# Patient Record
Sex: Female | Born: 1935
Health system: Southern US, Community
[De-identification: ages and names within clinical notes are randomized; demographics above are authoritative.]

## PROBLEM LIST (undated history)

## (undated) DIAGNOSIS — H409 Unspecified glaucoma: Secondary | ICD-10-CM

## (undated) DIAGNOSIS — T4145XA Adverse effect of unspecified anesthetic, initial encounter: Secondary | ICD-10-CM

## (undated) DIAGNOSIS — D649 Anemia, unspecified: Secondary | ICD-10-CM

## (undated) DIAGNOSIS — J189 Pneumonia, unspecified organism: Secondary | ICD-10-CM

## (undated) DIAGNOSIS — R0600 Dyspnea, unspecified: Secondary | ICD-10-CM

## (undated) DIAGNOSIS — R1310 Dysphagia, unspecified: Secondary | ICD-10-CM

## (undated) DIAGNOSIS — E1122 Type 2 diabetes mellitus with diabetic chronic kidney disease: Secondary | ICD-10-CM

## (undated) DIAGNOSIS — F32A Depression, unspecified: Secondary | ICD-10-CM

## (undated) DIAGNOSIS — N186 End stage renal disease: Secondary | ICD-10-CM

## (undated) DIAGNOSIS — C50911 Malignant neoplasm of unspecified site of right female breast: Secondary | ICD-10-CM

## (undated) DIAGNOSIS — T8859XA Other complications of anesthesia, initial encounter: Secondary | ICD-10-CM

## (undated) DIAGNOSIS — I10 Essential (primary) hypertension: Secondary | ICD-10-CM

## (undated) DIAGNOSIS — N184 Chronic kidney disease, stage 4 (severe): Secondary | ICD-10-CM

## (undated) DIAGNOSIS — E785 Hyperlipidemia, unspecified: Secondary | ICD-10-CM

## (undated) DIAGNOSIS — K219 Gastro-esophageal reflux disease without esophagitis: Secondary | ICD-10-CM

## (undated) DIAGNOSIS — N189 Chronic kidney disease, unspecified: Secondary | ICD-10-CM

## (undated) DIAGNOSIS — F329 Major depressive disorder, single episode, unspecified: Secondary | ICD-10-CM

## (undated) DIAGNOSIS — F419 Anxiety disorder, unspecified: Secondary | ICD-10-CM

## (undated) DIAGNOSIS — C9000 Multiple myeloma not having achieved remission: Secondary | ICD-10-CM

## (undated) DIAGNOSIS — F0390 Unspecified dementia without behavioral disturbance: Secondary | ICD-10-CM

## (undated) DIAGNOSIS — E119 Type 2 diabetes mellitus without complications: Secondary | ICD-10-CM

## (undated) HISTORY — DX: Anxiety disorder, unspecified: F41.9

## (undated) HISTORY — DX: Chronic kidney disease, stage 4 (severe): N18.4

## (undated) HISTORY — DX: Type 2 diabetes mellitus with diabetic chronic kidney disease: N18.6

## (undated) HISTORY — PX: FRACTURE SURGERY: SHX138

## (undated) HISTORY — DX: Multiple myeloma not having achieved remission: C90.00

## (undated) HISTORY — DX: Essential (primary) hypertension: I10

## (undated) HISTORY — DX: Major depressive disorder, single episode, unspecified: F32.9

## (undated) HISTORY — DX: Malignant neoplasm of unspecified site of right female breast: C50.911

## (undated) HISTORY — DX: Depression, unspecified: F32.A

## (undated) HISTORY — PX: RETINAL DETACHMENT SURGERY: SHX105

## (undated) HISTORY — DX: Chronic kidney disease, unspecified: N18.9

## (undated) HISTORY — DX: Anemia, unspecified: D64.9

## (undated) HISTORY — DX: Type 2 diabetes mellitus without complications: E11.9

## (undated) HISTORY — PX: MASTECTOMY, PARTIAL: SHX709

## (undated) HISTORY — DX: Type 2 diabetes mellitus with diabetic chronic kidney disease: E11.22

## (undated) HISTORY — PX: COLONOSCOPY: SHX174

## (undated) HISTORY — PX: OTHER SURGICAL HISTORY: SHX169

---

## 2006-06-30 ENCOUNTER — Ambulatory Visit (HOSPITAL_COMMUNITY): Admission: RE | Admit: 2006-06-30 | Discharge: 2006-06-30 | Payer: Self-pay | Admitting: *Deleted

## 2007-08-24 ENCOUNTER — Ambulatory Visit (HOSPITAL_COMMUNITY): Admission: RE | Admit: 2007-08-24 | Discharge: 2007-08-24 | Payer: Self-pay | Admitting: Ophthalmology

## 2008-03-19 ENCOUNTER — Ambulatory Visit (HOSPITAL_COMMUNITY): Payer: Self-pay | Admitting: Psychology

## 2008-03-26 ENCOUNTER — Ambulatory Visit (HOSPITAL_COMMUNITY): Payer: Self-pay | Admitting: Psychology

## 2008-04-02 ENCOUNTER — Ambulatory Visit (HOSPITAL_COMMUNITY): Payer: Self-pay | Admitting: Psychology

## 2008-04-16 ENCOUNTER — Ambulatory Visit (HOSPITAL_COMMUNITY): Payer: Self-pay | Admitting: Psychology

## 2008-05-21 ENCOUNTER — Ambulatory Visit (HOSPITAL_COMMUNITY): Payer: Self-pay | Admitting: Psychology

## 2008-06-21 ENCOUNTER — Ambulatory Visit (HOSPITAL_COMMUNITY): Payer: Self-pay | Admitting: Psychiatry

## 2008-06-28 ENCOUNTER — Ambulatory Visit (HOSPITAL_COMMUNITY): Payer: Self-pay | Admitting: Psychology

## 2008-07-26 ENCOUNTER — Ambulatory Visit (HOSPITAL_COMMUNITY): Payer: Self-pay | Admitting: Psychology

## 2008-08-27 ENCOUNTER — Ambulatory Visit (HOSPITAL_COMMUNITY): Payer: Self-pay | Admitting: Psychology

## 2008-09-28 ENCOUNTER — Ambulatory Visit (HOSPITAL_COMMUNITY): Payer: Self-pay | Admitting: Psychology

## 2009-02-13 ENCOUNTER — Ambulatory Visit (HOSPITAL_COMMUNITY): Payer: Self-pay | Admitting: Psychology

## 2010-07-22 NOTE — Op Note (Signed)
Carly Wood, Carly Wood                ACCOUNT NO.:  [id number]   MEDICAL RECORD NO.:  [id number]          PATIENT TYPE:  AMB   LOCATION:  SDS                          FACILITY:  [facility]   PHYSICIAN:  Clent Demark. Rankin, M.D.   DATE OF BIRTH:  Jul 21, 1935   DATE OF PROCEDURE:  08/24/2007  DATE OF DISCHARGE:  08/24/2007                               OPERATIVE REPORT   PREOPERATIVE DIAGNOSES:  1. Combined traction rhegmatogenous detachment, right eye - neglected      macula-off.  2. Proliferative vitreoretinopathy, stage C2.   POSTOPERATIVE DIAGNOSES:  1. Combined traction rhegmatogenous detachment, right eye - neglected      macula-off.  2. Proliferative vitreoretinopathy, stage C2.   PROCEDURES:  Posterior vitrectomy with membrane peel to allow complex  retinal detach repair with scleral buckle vitrectomy and injection of  temporary gas, temporary air, final injection of vitreous substitute  permanent silicone oil [id number] [place] of the right eye, 20-gauge  vitrectomy was used.   SURGEON:  Clent Demark. Rankin, MD   ANESTHESIA:  General endotracheal anesthesia.   INDICATIONS FOR PROCEDURE:  The patient is a 75 year old woman with  profound vision loss in the right eye in level of 2400s and found to  have inferior retinal detachment, evidence of proliferative  vitreoretinopathy, chronic retinal detachment with outer retinal white  dots in the area of previous elevation.  She also appears to have PVR  with star folds inferonasally and inferotemporally.  This is an attempt  to reattach retina.  The patient understands the risks of anesthesia,  including rate occurrence of death, loss of the eye including, but not  limited to hemorrhage, infection, scarring, need for surgery, no change  in vision, loss of vision, and progression of disease despite  intervention.   PROCEDURE IN DETAIL:  After appropriate signed consent was obtained, the  patient was taken to the operating room.  In the  operating room,  appropriate monitors followed by mild sedation.  General endotracheal  anesthesia instilled without difficulty.  The right periocular region  was sterilely prepped and draped in usual sterile fashion.  Lid speculum  was applied.  Conjunctival peritomy was then fashioned to 360 degrees.  Quadrants were entered.  Rectus muscles isolated on 2-0 silk ties.  Two  rows of retinal cryopexy was placed, the inferior one 180 degrees on the  vitreous base.  At this time, 287 soft synthetic filament was selected  and placed with 240 band encircling.  The buckle started at 2:30  position extending inferior and around to the 9:30 position with the  encircling band joined on the superonasal quadrant with 70-Watzke  sleeve.  The buckle was placed in the position, thereafter the tension  was drawn, but it was not tied permanently at this time.  Attention was  drawn to vitrectomy.  A 20-gauge vitrectomy was started.  The infusion  was secured 3.5 mm posterior to limbus in the infratemporal quadrant.  Placement of vitreous cavity verified visually.  Superior sclerotomies  fashioned.  Microscope was positioned with the biome attachment.  Core  vitrectomy was then begun.  Excellent mobilization of the retina was  obtained.  Vitreous base shaved 360 degrees.  Internal diathermy was  then used to create a retinotomy posteriorly inferior to the optic  nerve.  Fluid-fluid exchange carried out and thick subretinal fluid was  removed.  Thereafter, the sclerotomies were then closed temporarily.  The buckle was then tightened and secured with 5-0 Mersilene mattress  sutures, 2 in the inferior quadrants each and then 1 in the superior  quadrants each.  Band was tightened to appropriate tension.  Thereafter,  the attention was drawn to vitrectomy.  Fluid air exchange completed.  The laser retinopexy was then applied on the slope the buckle inferiorly  bed of the detachment as well as posterior to the  buckle inferior 180  degrees.  All preretinal subretinal fluid was removed in this fashion.  A retinotomy was then closed with laser retinopexy.  Fluid and air-  silicone oil exchange carried out passively after closing off wound of  the sclerotomies superiorly.  Appropriate fill was obtained.  All  sclerotomies were closed and the infusion was similarly closed.  The  scleral surface of the eye was then copiously irrigated to remove her  small amounts of silicone oil.  Thereafter, the conjunctiva was then  irrigated and sub-Tenon's area was irrigated with bug juice.  Tenon's  and then conjunctiva brought forward and closed with interrupted 7-0  Vicryl to the limbus.  Subconjunctival Decadron applied.  Sterile patch  and Fox shield applied.  The patient tolerated the procedure without  complication.  She was taken to the recovery room in good stable  condition.      Clent Demark Rankin, M.D.  Electronically Signed     GAR/MEDQ  D:  08/24/2007  T:  08/25/2007  Job:  [id number]

## 2010-12-04 LAB — CBC
HCT: 33.9 — ABNORMAL LOW
Hemoglobin: 11.5 — ABNORMAL LOW
MCHC: 33.9
MCV: 90.3
RBC: 3.75 — ABNORMAL LOW

## 2010-12-04 LAB — BASIC METABOLIC PANEL
CO2: 27
Chloride: 107
Glucose, Bld: 96
Potassium: 3.5
Sodium: 142

## 2015-08-19 ENCOUNTER — Encounter (HOSPITAL_COMMUNITY): Payer: Self-pay | Admitting: Hematology & Oncology

## 2015-08-19 ENCOUNTER — Encounter (HOSPITAL_COMMUNITY): Payer: Medicare Other | Attending: Hematology & Oncology | Admitting: Hematology & Oncology

## 2015-08-19 ENCOUNTER — Encounter (HOSPITAL_COMMUNITY): Payer: Self-pay | Admitting: Lab

## 2015-08-19 VITALS — BP 133/65 | HR 68 | Temp 97.7°F | Resp 16 | Ht 63.0 in | Wt 185.6 lb

## 2015-08-19 DIAGNOSIS — C50411 Malignant neoplasm of upper-outer quadrant of right female breast: Secondary | ICD-10-CM

## 2015-08-19 DIAGNOSIS — I890 Lymphedema, not elsewhere classified: Secondary | ICD-10-CM

## 2015-08-19 DIAGNOSIS — Z1239 Encounter for other screening for malignant neoplasm of breast: Secondary | ICD-10-CM

## 2015-08-19 DIAGNOSIS — D0591 Unspecified type of carcinoma in situ of right breast: Secondary | ICD-10-CM

## 2015-08-19 DIAGNOSIS — D649 Anemia, unspecified: Secondary | ICD-10-CM

## 2015-08-19 DIAGNOSIS — E1122 Type 2 diabetes mellitus with diabetic chronic kidney disease: Secondary | ICD-10-CM | POA: Insufficient documentation

## 2015-08-19 DIAGNOSIS — N183 Chronic kidney disease, stage 3 unspecified: Secondary | ICD-10-CM

## 2015-08-19 DIAGNOSIS — N189 Chronic kidney disease, unspecified: Secondary | ICD-10-CM | POA: Insufficient documentation

## 2015-08-19 DIAGNOSIS — Z79811 Long term (current) use of aromatase inhibitors: Secondary | ICD-10-CM

## 2015-08-19 DIAGNOSIS — Z170 Estrogen receptor positive status [ER+]: Secondary | ICD-10-CM

## 2015-08-19 DIAGNOSIS — Z9011 Acquired absence of right breast and nipple: Secondary | ICD-10-CM

## 2015-08-19 DIAGNOSIS — Z7982 Long term (current) use of aspirin: Secondary | ICD-10-CM | POA: Insufficient documentation

## 2015-08-19 DIAGNOSIS — M8580 Other specified disorders of bone density and structure, unspecified site: Secondary | ICD-10-CM

## 2015-08-19 DIAGNOSIS — Z79899 Other long term (current) drug therapy: Secondary | ICD-10-CM | POA: Insufficient documentation

## 2015-08-19 DIAGNOSIS — C50911 Malignant neoplasm of unspecified site of right female breast: Secondary | ICD-10-CM

## 2015-08-19 MED ORDER — LIDOCAINE-PRILOCAINE 2.5-2.5 % EX CREA: 1.0000 "application " | TOPICAL_CREAM | CUTANEOUS | Status: DC | PRN

## 2015-08-19 NOTE — Progress Notes (Signed)
[facility]  CONSULT NOTE  CHIEF COMPLAINTS/PURPOSE OF CONSULTATION:    Stage 1 infiltrating ductal carcinoma of right female breast ([place])   10/24/2014 Cancer Staging T1cN0M0   10/24/2014 Surgery R mastectomy, T1c, N0   12/28/2014 -  Chemotherapy Taxol/herceptin weekly X 12, Herceptin every 21 days, last due in September 2017   03/11/2015 -  Anti-estrogen oral therapy Arimidex 1 mg daily   HISTORY OF PRESENTING ILLNESS:  Carly Wood 80 y.o. female is here to establish ongoing care for a stage I ER+ PR+ HER 2 + carcinoma of the R breast. She underwent a mastectomy last August. She is currently on herceptin/arimidex. Patient's malignancy was in the upper outer quadrant of the R breast, she had undergone a biopsy previously of this area but it was benign. Repeat mammogram on 09/12/2014 showed growth in the spiculated mass and repeat biopsy was recommended.   She is here today accompanied by her sisters Deneise Lever and [place], who she lives with. They remark that their sister [name] is not here.   The patient says that everybody calls her Carly Wood. She says she prays often for everyone, and is praying we can keep her alive.   She was advised that her cancer was Stage 1 and that the ongoing goal is for her to complete 52 weeks of Herceptin and remain on her aromatase inhibitor. The patient and her sisters also mention that her mammogram is due.Per records she will be due for mammograph in July 2017.   Ms. Biglow and her sisters note that she does not have any cream to numb her port. She is open to trying the numbing cream.  She notes that Dr. Ladona Horns in [place] did her surgery. She says she also recently fell and hurt her knee, but that the pain from her knee is now resolved.  Ms Corsi and her sister Wilford Sports have diabetes. Her sister Wilford Sports mentions that she has multiple myeloma and also had a CNS lymphoma, and notes that she is on Revlamid.  She is interested in discussing her use of benadryl  prior to her herceptin, she notes that it makes her too sleepy. As an aside, Ms. Eklund notes that she does not have any nausea or diarrhea from her chemo.  Toward the end of her appointment, she notes "why does my back stay cold all the time?"  During the physical examination, she notes that her appetite is good. She also denies any belly pain, and denies any lumps or bumps anywhere. Lymphedema is noted in her R arm, and she says she is not currently receiving any form of therapy for this.  Dr. Brigitte Pulse is her PCP.   MEDICAL HISTORY:  Past Medical History  Diagnosis Date  . Brain cancer ([place])     right   . Diabetes mellitus without complication ([place])   . Anemia   . Chronic kidney disease     SURGICAL HISTORY: History reviewed. No pertinent past surgical history.  SOCIAL HISTORY: Social History   Social History  . Marital Status: Single    Spouse Name: N/A  . Number of Children: N/A  . Years of Education: N/A   Occupational History  . Not on file.   Social History Main Topics  . Smoking status: Never Smoker   . Smokeless tobacco: Not on file  . Alcohol Use: Not on file  . Drug Use: Not on file  . Sexual Activity: Not on file   Other Topics Concern  .  Not on file   Social History Narrative   Carly Wood, the patient, never married; says she never wanted to. She remarks that this was a choice she made; "not that I didn't have the opportunities, I just made the choice not to do it." NO tobacco, cigarettes or chewing tobacco. No ETOH  The 3 sisters live together. Deneise Lever does all the cooking.  She has nieces and nephews; one teaches school in [place]. Pilar Plate is her oldest brother; he was a principal. Has retired. Pilar Plate has a wife who was a Pharmacist, hospital; they have 3 children, including a son who graduated from [employer], and a daughter who graduated from [employer] who is a Sport and exercise psychologist, and another daughter who has a PhD in education. Harle Battiest is her "favorite niece." Martin Majestic to college and is  teaching something to do with drafting.  The patient, Carly Wood, was a good Ship broker; graduated high school and college. 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 Taught in [place], [place].  She notes she had hobbies; used to do artwork. Drawing pictures, mostly flowers, houses. Pen and pencil, colored pencils.  FAMILY HISTORY: History reviewed. No pertinent family history.  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  Father was 63 when he died; heart disease. Mother was 30 when she died. Diabetic, with congestive heart failure. Had a stroke as well. Sister Deneise Lever was caregiver for their mother for 10 years after her stroke. Her sister Wilford Sports has multiple myeloma and also had a CNS lymphoma, and notes that she is on Revlamid. Wilford Sports is 16 years out from her brain surgery.  7 brothers and sisters total; one is deceased. 6 siblings left. 1 that passed died of prostate cancer. "He lasted 12 years."  ALLERGIES:  is allergic to penicillins; ace inhibitors; and lisinopril.  MEDICATIONS:  Current Outpatient Prescriptions  Medication Sig Dispense Refill  . amLODipine (NORVASC) 5 MG tablet     . anastrozole (ARIMIDEX) 1 MG tablet Take by mouth.    Marland Kitchen acetaminophen (TYLENOL) 500 MG tablet Take by mouth.    Marland Kitchen anti-nausea (EMETROL) solution Take by mouth.    Marland Kitchen aspirin 81 MG chewable tablet Chew by mouth.    . calcium-vitamin D (OSCAL WITH D) 500-200 MG-UNIT tablet Take by mouth.    . cloNIDine (CATAPRES) 0.3 MG tablet Take by mouth.    . docusate sodium (COLACE) 100 MG capsule Take by mouth.    . esomeprazole (NEXIUM) 40 MG capsule Take by mouth.    Marland Kitchen glimepiride (AMARYL) 4 MG tablet Take by mouth.     . lidocaine-prilocaine (EMLA) cream Apply 1 application topically as needed. Apply to port site prior when needed for chemo, labs or port flushes. 30 g 2  . LORazepam (ATIVAN) 0.5 MG tablet Take by mouth.    . nebivolol (BYSTOLIC) 10 MG tablet Take by mouth.    Marland Kitchen PARoxetine (PAXIL) 20 MG tablet Take by mouth.    . pioglitazone (ACTOS) 15 MG tablet Take by mouth.    . pioglitazone (ACTOS) 30 MG tablet Take 15 mg by mouth 1 day or 1 dose.    . pravastatin (PRAVACHOL) 20 MG tablet Take by mouth.    Marland Kitchen  pravastatin (PRAVACHOL) 20 MG tablet     . traZODone (DESYREL) 50 MG tablet     . VITAMIN B COMPLEX-C PO Take by mouth.     No current facility-administered medications for this visit.    Review of Systems  Constitutional: Negative.  Negative for fever, chills, weight loss and malaise/fatigue.  HENT: Negative.  Negative for congestion, hearing loss, nosebleeds, sore throat and tinnitus.   Eyes: Negative.  Negative for blurred vision, double vision, pain and discharge.  Respiratory: Negative.  Negative for cough, hemoptysis, sputum production, shortness of breath and wheezing.   Cardiovascular: Negative.  Negative for chest pain, palpitations, claudication, leg swelling and PND.  Gastrointestinal: Negative.  Negative for heartburn, nausea, vomiting, abdominal pain, diarrhea, constipation, blood in stool and melena.  Genitourinary: Negative.  Negative for dysuria, urgency, frequency and hematuria.  Musculoskeletal: Negative.  Negative for myalgias, joint pain and falls.  Skin: Negative.  Negative for itching and rash.  Neurological: Negative.  Negative for dizziness, tingling, tremors, sensory change, speech change, focal weakness, seizures, loss of consciousness, weakness and headaches.  Endo/Heme/Allergies: Negative.  Does not bruise/bleed easily.  Psychiatric/Behavioral: Negative.  Negative for depression, suicidal ideas, memory loss and substance abuse. The patient is not nervous/anxious and  does not have insomnia.   All other systems reviewed and are negative.  14 point ROS was done and is otherwise as detailed above or in HPI   PHYSICAL EXAMINATION: ECOG PERFORMANCE STATUS: 1 - Symptomatic but completely ambulatory  Filed Vitals:   08/19/15 1450  BP: 133/65  Pulse: 68  Temp: 97.7 F (36.5 C)  Resp: 16   Filed Weights   08/19/15 1450  Weight: 185 lb 9.6 oz (84.188 kg)     Physical Exam  Constitutional: She is oriented to person, place, and time and well-developed, well-nourished, and in no distress.  HENT:  Head: Normocephalic and atraumatic.  Right Ear: External ear normal.  Nose: Nose normal.  Mouth/Throat: Oropharynx is clear and moist. No oropharyngeal exudate.  Eyes: Conjunctivae and EOM are normal. Pupils are equal, round, and reactive to light. Right eye exhibits no discharge. Left eye exhibits no discharge. No scleral icterus.  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  Neck: Normal range of motion. Neck supple. No tracheal deviation present. No thyromegaly present.  Cardiovascular: Normal rate, regular rhythm and normal heart sounds.  Exam reveals no gallop and no friction rub.   No murmur heard. Pulmonary/Chest: Effort normal and breath sounds normal. She has no wheezes. She has no rales.    Abdominal: Soft. Bowel sounds are normal. She exhibits no distension and no mass. There is no tenderness. There is no rebound and no guarding.  Musculoskeletal: Normal range of motion. She exhibits no edema.  Lymphadenopathy:    She has no cervical adenopathy.  Lymphedema in her right arm; no current treatment for this.  Neurological: She is alert and oriented to person, place, and time. She has normal reflexes. No cranial nerve deficit. Gait normal. Coordination normal.  Skin: Skin is warm and dry. No rash noted.  Psychiatric: Mood, memory, affect and judgment normal.  Nursing note and vitals reviewed.     LABORATORY DATA:  I have reviewed the data as  listed Lab Results  Component Value Date   WBC 7.1 08/24/2007   HGB 11.5* 08/24/2007   HCT 33.9* 08/24/2007   MCV 90.3 08/24/2007   PLT 296 08/24/2007   CMP     Component Value Date/Time   NA  142 08/24/2007 0952   K 3.5 08/24/2007 0952   CL 107 08/24/2007 0952   CO2 27 08/24/2007 0952   GLUCOSE 96 08/24/2007 0952   BUN 21 08/24/2007 0952   CREATININE 1.14 08/24/2007 0952   CALCIUM 9.7 08/24/2007 0952   GFRNONAA 47* 08/24/2007 0952   GFRAA * 08/24/2007 0952    57        The eGFR has been calculated using the MDRD equation. This calculation has not been validated in all clinical   RADIOGRAPHIC STUDIES: I have personally reviewed the radiological images as listed and agreed with the findings in the report. No results found.  ASSESSMENT & PLAN:  Stage I ER+ PR+ HER 2 + carcinoma of R breast, upper outer quadrant R mastectomy Arimidex Herceptin Anemia CKD, Stage III DEXA with osteopenia 05/2015 Last 2-D echo 05/2015 Lymphedema  L breast screening mammogram due 09/2015  She was advised that her cancer was Stage 1 and that the goal is for her to complete 52 weeks of Herceptin. She is due again for Herceptin on the 23rd (08/30/2015).  I need additional pathology in regards to her HER 2 testing and we will request this from [facility]. We need a copy of her last ECHO as well. She will be due again according to records.   We discussed her lymphedema. She has not been to PT. She does not have a sleeve or glove. I explained the long term effects from untreated lymphedema. Currently not willing to go to PT. We will address again at follow-up.   Her sisters are concerned about her physical inactivity and we discussed the benefits of activity for overall health. We discussed the benefits of physical activity in regards to reducing breast cancer recurrence.   She is to continue on calcium and vitamin D daily. She has not discussed prolia or a bisphosphonate therapy in regards to her AI  use and bone density and we will address this moving forward as well.   Mammogram for July has been ordered.   I will see her again for follow-up after next herceptin therapy.   Anemia will be followed. We can consider additional anemia workup in the near future as well.   Orders Placed This Encounter  Procedures  . MM SCREENING BREAST TOMO UNI L    Standing Status: Future     Number of Occurrences:      Standing Expiration Date: 10/21/2016    Order Specific Question:  Reason for Exam (SYMPTOM  OR DIAGNOSIS REQUIRED)    Answer:  screening, history R breast cancer s/p mastectomy    Order Specific Question:  Preferred imaging location?    Answer:  [facility]    All questions were answered. The patient knows to call the clinic with any problems, questions or concerns.  This document serves as a record of services personally performed by Ancil Linsey, MD. It was created on her behalf by Toni Amend, a trained medical scribe. The creation of this record is based on the scribe's personal observations and the provider's statements to them. This document has been checked and approved by the attending provider.  I have reviewed the above documentation for accuracy and completeness and I agree with the above.  This note was electronically signed.  Molli Hazard, MD  08/22/2015 4:33 PM

## 2015-08-19 NOTE — Patient Instructions (Addendum)
[facility] at [facility] Discharge Instructions  RECOMMENDATIONS MADE BY THE CONSULTANT AND ANY TEST RESULTS WILL BE SENT TO YOUR REFERRING PHYSICIAN.  Exam done and seen today by Dr. Whitney Muse Will do labs prior to treatment. Will start Herceptin on June 23rd Will need Referral for lymphadema therapy in [place]. Return to see the doctor after second treatment. Please call the clinic if you have any questions or concerns  Thank you for choosing [facility] at [facility] to provide your oncology and hematology care.  To afford each patient quality time with our provider, please arrive at least 15 minutes before your scheduled appointment time.   Beginning January 23rd 2017 lab work for the [facility] will be done in the  Main lab at [facility] on 1st floor. If you have a lab appointment with the [facility] please come in thru the  Main Entrance and check in at the main information desk  You need to re-schedule your appointment should you arrive 10 or more minutes late.  We strive to give you quality time with our providers, and arriving late affects you and other patients whose appointments are after yours.  Also, if you no show three or more times for appointments you may be dismissed from the clinic at the providers discretion.     Again, thank you for choosing [facility].  Our hope is that these requests will decrease the amount of time that you wait before being seen by our physicians.       _____________________________________________________________  Should you have questions after your visit to [facility], please contact our office at [phone] between the hours of 8:30 a.m. and 4:30 p.m.  Voicemails left after 4:30 p.m. will not be returned until the following business day.  For prescription refill requests, have your pharmacy contact our office.         Resources For Cancer Patients and their  Caregivers ? American Cancer Society: Can assist with transportation, wigs, general needs, runs Look Good Feel Better.        936-448-0701 ? Cancer Care: Provides financial assistance, online support groups, medication/co-pay assistance.  1-800-813-HOPE (819)088-9244) ? [facility] Assists [place] cancer patients and their families through emotional , educational and financial support.  334-261-9330 ? [place] DSS Where to apply for food stamps, Medicaid and utility assistance. (407)269-9509 ? [facility]: Transportation to medical appointments. 862-443-2693 ? Social Security Administration: May apply for disability if have a Stage IV cancer. (812)376-4814 (310)761-6688 ? [employer], Disability and Transit Services: Assists with nutrition, care and transit needs. [facility] Support Programs: @10RELATIVEDAYS @ > Cancer Support Group  2nd Tuesday of the month 1pm-2pm, Journey Room  > Creative Journey  3rd Tuesday of the month 1130am-1pm, Journey Room  > Look Good Feel Better  1st Wednesday of the month 10am-12 noon, Journey Room (Call [facility] to register [phone])

## 2015-08-19 NOTE — Progress Notes (Signed)
Referral made to [facility] therapy.  Records faxed on 6/12 and they will contact patient.

## 2015-08-21 ENCOUNTER — Encounter (HOSPITAL_COMMUNITY): Payer: Self-pay | Admitting: Hematology & Oncology

## 2015-08-21 DIAGNOSIS — C50911 Malignant neoplasm of unspecified site of right female breast: Secondary | ICD-10-CM | POA: Insufficient documentation

## 2015-08-21 HISTORY — DX: Malignant neoplasm of unspecified site of right female breast: C50.911

## 2015-08-22 ENCOUNTER — Encounter (HOSPITAL_COMMUNITY): Payer: Self-pay | Admitting: Hematology & Oncology

## 2015-08-22 DIAGNOSIS — I890 Lymphedema, not elsewhere classified: Secondary | ICD-10-CM | POA: Insufficient documentation

## 2015-08-22 DIAGNOSIS — N184 Chronic kidney disease, stage 4 (severe): Secondary | ICD-10-CM

## 2015-08-22 DIAGNOSIS — Z9011 Acquired absence of right breast and nipple: Secondary | ICD-10-CM | POA: Insufficient documentation

## 2015-08-22 DIAGNOSIS — M8580 Other specified disorders of bone density and structure, unspecified site: Secondary | ICD-10-CM | POA: Insufficient documentation

## 2015-08-22 DIAGNOSIS — Z79811 Long term (current) use of aromatase inhibitors: Secondary | ICD-10-CM | POA: Insufficient documentation

## 2015-08-22 HISTORY — DX: Chronic kidney disease, stage 4 (severe): N18.4

## 2015-08-30 ENCOUNTER — Encounter (HOSPITAL_COMMUNITY): Payer: Self-pay | Admitting: Oncology

## 2015-08-30 ENCOUNTER — Encounter (HOSPITAL_BASED_OUTPATIENT_CLINIC_OR_DEPARTMENT_OTHER): Payer: Medicare Other | Admitting: Oncology

## 2015-08-30 ENCOUNTER — Encounter (HOSPITAL_BASED_OUTPATIENT_CLINIC_OR_DEPARTMENT_OTHER): Payer: Medicare Other

## 2015-08-30 VITALS — BP 158/75 | HR 72 | Temp 98.4°F | Resp 18 | Wt 191.6 lb

## 2015-08-30 DIAGNOSIS — Z79899 Other long term (current) drug therapy: Secondary | ICD-10-CM | POA: Diagnosis not present

## 2015-08-30 DIAGNOSIS — C50911 Malignant neoplasm of unspecified site of right female breast: Secondary | ICD-10-CM

## 2015-08-30 DIAGNOSIS — C50411 Malignant neoplasm of upper-outer quadrant of right female breast: Secondary | ICD-10-CM | POA: Diagnosis not present

## 2015-08-30 DIAGNOSIS — Z79811 Long term (current) use of aromatase inhibitors: Secondary | ICD-10-CM

## 2015-08-30 DIAGNOSIS — Z7982 Long term (current) use of aspirin: Secondary | ICD-10-CM | POA: Diagnosis not present

## 2015-08-30 DIAGNOSIS — Z5112 Encounter for antineoplastic immunotherapy: Secondary | ICD-10-CM

## 2015-08-30 DIAGNOSIS — Z170 Estrogen receptor positive status [ER+]: Secondary | ICD-10-CM

## 2015-08-30 DIAGNOSIS — N189 Chronic kidney disease, unspecified: Secondary | ICD-10-CM | POA: Diagnosis not present

## 2015-08-30 DIAGNOSIS — M8580 Other specified disorders of bone density and structure, unspecified site: Secondary | ICD-10-CM | POA: Diagnosis not present

## 2015-08-30 DIAGNOSIS — E1122 Type 2 diabetes mellitus with diabetic chronic kidney disease: Secondary | ICD-10-CM | POA: Diagnosis not present

## 2015-08-30 LAB — COMPREHENSIVE METABOLIC PANEL
ALT: 13 U/L — ABNORMAL LOW (ref 14–54)
ANION GAP: 7 (ref 5–15)
AST: 18 U/L (ref 15–41)
Albumin: 3.1 g/dL — ABNORMAL LOW (ref 3.5–5.0)
Alkaline Phosphatase: 87 U/L (ref 38–126)
BILIRUBIN TOTAL: 0.4 mg/dL (ref 0.3–1.2)
BUN: 36 mg/dL — AB (ref 6–20)
CHLORIDE: 107 mmol/L (ref 101–111)
CO2: 24 mmol/L (ref 22–32)
Calcium: 8.3 mg/dL — ABNORMAL LOW (ref 8.9–10.3)
Creatinine, Ser: 2.26 mg/dL — ABNORMAL HIGH (ref 0.44–1.00)
GFR, EST AFRICAN AMERICAN: 23 mL/min — AB (ref >60)
GFR, EST NON AFRICAN AMERICAN: 20 mL/min — AB (ref >60)
Glucose, Bld: 209 mg/dL — ABNORMAL HIGH (ref 65–99)
POTASSIUM: 4 mmol/L (ref 3.5–5.1)
Sodium: 138 mmol/L (ref 135–145)
TOTAL PROTEIN: 6.8 g/dL (ref 6.5–8.1)

## 2015-08-30 LAB — CBC WITH DIFFERENTIAL/PLATELET
BASOS ABS: 0 10*3/uL (ref 0.0–0.1)
Basophils Relative: 1 %
EOS ABS: 0.2 10*3/uL (ref 0.0–0.7)
EOS PCT: 4 %
HEMATOCRIT: 28.2 % — AB (ref 36.0–46.0)
Hemoglobin: 9 g/dL — ABNORMAL LOW (ref 12.0–15.0)
LYMPHS PCT: 20 %
Lymphs Abs: 1.1 10*3/uL (ref 0.7–4.0)
MCH: 30.1 pg (ref 26.0–34.0)
MCHC: 31.9 g/dL (ref 30.0–36.0)
MCV: 94.3 fL (ref 78.0–100.0)
Monocytes Absolute: 0.3 10*3/uL (ref 0.1–1.0)
Monocytes Relative: 5 %
Neutro Abs: 3.8 10*3/uL (ref 1.7–7.7)
Neutrophils Relative %: 70 %
PLATELETS: 237 10*3/uL (ref 150–400)
RBC: 2.99 MIL/uL — ABNORMAL LOW (ref 3.87–5.11)
RDW: 16.3 % — AB (ref 11.5–15.5)
WBC: 5.4 10*3/uL (ref 4.0–10.5)

## 2015-08-30 MED ORDER — SODIUM CHLORIDE 0.9 % IV SOLN
Freq: Once | INTRAVENOUS | Status: AC
  Administered 2015-08-30: 12:00:00 via INTRAVENOUS

## 2015-08-30 MED ORDER — DIPHENHYDRAMINE HCL 25 MG PO CAPS
50.0000 mg | ORAL_CAPSULE | Freq: Once | ORAL | Status: AC
  Administered 2015-08-30: 50 mg via ORAL
  Filled 2015-08-30: qty 2

## 2015-08-30 MED ORDER — HEPARIN SOD (PORK) LOCK FLUSH 100 UNIT/ML IV SOLN
500.0000 [IU] | Freq: Once | INTRAVENOUS | Status: AC | PRN
  Administered 2015-08-30: 500 [IU]

## 2015-08-30 MED ORDER — TRASTUZUMAB CHEMO INJECTION 440 MG
6.0000 mg/kg | Freq: Once | INTRAVENOUS | Status: AC
  Administered 2015-08-30: 504 mg via INTRAVENOUS
  Filled 2015-08-30: qty 24

## 2015-08-30 MED ORDER — HEPARIN SOD (PORK) LOCK FLUSH 100 UNIT/ML IV SOLN
INTRAVENOUS | Status: AC
  Filled 2015-08-30: qty 5

## 2015-08-30 MED ORDER — SODIUM CHLORIDE 0.9% FLUSH: 10.0000 mL | INTRAVENOUS | Status: DC | PRN

## 2015-08-30 MED ORDER — ACETAMINOPHEN 325 MG PO TABS
650.0000 mg | ORAL_TABLET | Freq: Once | ORAL | Status: AC
  Administered 2015-08-30: 650 mg via ORAL
  Filled 2015-08-30: qty 2

## 2015-08-30 NOTE — Progress Notes (Signed)
No primary care provider on file. No primary provider on file.  Stage 1 infiltrating ductal carcinoma of right female breast ([place]) - Plan: [facility] Cardiac [facility] Rest  CURRENT THERAPY: Arimidex and Herceptin  INTERVAL HISTORY: Carly Wood 80 y.o. female returns for followup of Stage I (T1CN0M0) invasive ductal carcinoma of the right breast, ER/PR+, HER2+.    Stage 1 infiltrating ductal carcinoma of right female breast ([place])   09/12/2014 Mammogram Mass in upper outer R breast, middle third depth appears slightly larger than it was on prior exam with more irreg spiculated margins   10/02/2014 Pathology Results biopsy with invasive ductal carcinoma high grade 1.1 cm, dcis solid type. additional R breast tissue, excision, invasive ductal high grade 0.9 cm    10/24/2014 Pathology Results no residual invasive carcinoma, DCIS, focal, atypical ductal hyperplasia, 0/7 LN positive for metastatic carcinoma ER > 90%, PR 30%, HER 2 2+   10/24/2014 Cancer Staging T1cN0M0   10/24/2014 Surgery R mastectomy, T1c, N0   12/28/2014 -  Chemotherapy Taxol/herceptin weekly X 12, Herceptin every 21 days, last due in September 2017   03/11/2015 -  Anti-estrogen oral therapy Arimidex 1 mg daily   The patient is accompanied by her sister today.  The patient is only minimally participatory in today's conversation today secondary to drowsiness from Benadryl Pre-medication.  The patient denies any complaints and confirms compliance with AI therapy.    Her sister confirms tha she is doing well.  Review of Systems  Constitutional: Negative.   HENT: Negative.   Eyes: Negative.   Respiratory: Negative.   Cardiovascular: Negative.   Gastrointestinal: Negative.   Genitourinary: Negative.   Musculoskeletal: Negative.   Skin: Negative.   Neurological: Negative.   Endo/Heme/Allergies: Negative.   Psychiatric/Behavioral: Negative.     Past Medical History  Diagnosis Date  . Brain cancer ([place])     right   .  Diabetes mellitus without complication ([place])   . Anemia   . Chronic kidney disease   . Stage 1 infiltrating ductal carcinoma of right female breast ([place]) 08/21/2015    ER+ PR+ HER 2 neu + (3+) T1cN0     History reviewed. No pertinent past surgical history.  History reviewed. No pertinent family history.  Social History   Social History  . Marital Status: Single    Spouse Name: N/A  . Number of Children: N/A  . Years of Education: N/A   Social History Main Topics  . Smoking status: Never Smoker   . Smokeless tobacco: None  . Alcohol Use: None  . Drug Use: None  . Sexual Activity: Not Asked   Other Topics Concern  . None   Social History Narrative     PHYSICAL EXAMINATION  ECOG PERFORMANCE STATUS: 2 - Symptomatic, <50% confined to bed  There were no vitals filed for this visit.  BP: 158/75 P 72 T 98.6F R 18 Weight 191.6 lbs  GENERAL:alert, no distress, comfortable, cooperative, obese and fatigued with flat affect, in chemorecliner, and accompanied by her sister. SKIN: skin color, texture, turgor are normal, no rashes or significant lesions HEAD: Normocephalic, No masses, lesions, tenderness or abnormalities EYES: normal, Conjunctiva are pink and non-injected EARS: External ears normal OROPHARYNX:mucous membranes are moist  NECK: supple, trachea midline LYMPH:  not examined BREAST:not examined LUNGS: clear to auscultation  HEART: regular rate & rhythm ABDOMEN:abdomen soft and obese BACK: Back symmetric, no curvature. EXTREMITIES:less then 2 second capillary refill, no joint deformities, effusion, or  inflammation, no skin discoloration  NEURO: alert & oriented x 3 with fluent speech, no focal motor/sensory deficits   LABORATORY DATA: CBC    Component Value Date/Time   WBC 5.4 08/30/2015 1148   RBC 2.99* 08/30/2015 1148   HGB 9.0* 08/30/2015 1148   HCT 28.2* 08/30/2015 1148   PLT 237 08/30/2015 1148   MCV 94.3 08/30/2015 1148   MCH 30.1 08/30/2015 1148     MCHC 31.9 08/30/2015 1148   RDW 16.3* 08/30/2015 1148   LYMPHSABS 1.1 08/30/2015 1148   MONOABS 0.3 08/30/2015 1148   EOSABS 0.2 08/30/2015 1148   BASOSABS 0.0 08/30/2015 1148      Chemistry      Component Value Date/Time   NA 138 08/30/2015 1148   K 4.0 08/30/2015 1148   CL 107 08/30/2015 1148   CO2 24 08/30/2015 1148   BUN 36* 08/30/2015 1148   CREATININE 2.26* 08/30/2015 1148      Component Value Date/Time   CALCIUM 8.3* 08/30/2015 1148   ALKPHOS 87 08/30/2015 1148   AST 18 08/30/2015 1148   ALT 13* 08/30/2015 1148   BILITOT 0.4 08/30/2015 1148        PENDING LABS:   RADIOGRAPHIC STUDIES:  No results found.   PATHOLOGY:    ASSESSMENT AND PLAN:  Stage 1 infiltrating ductal carcinoma of right female breast (HCC) Stage I (T1CN0M0) invasive ductal carcinoma of the right breast, ER/PR+, HER2+. S/P right mastectomy on 10/24/2014, followed by adjuvant chemotherapy consisting of weekly Taxol/Herceptin beginning on 12/28/2015.  Now on Herceptin x 52 weeks and AI therapy.  Oncology history is up to date.  Staging in [facility] problem list is completed.  I personally reviewed and went over laboratory results with the patient.  The results are noted within this dictation.  She is due for MUGA scan prior to her next cycle of chemotherapy.  As a result, order is placed and will be scheduled accordingly.  The patient has osteopenia while being on an AI.  She is a candidate for Prolia intervention.  Today, the patient is unable to participate in a meaningful conversation regarding this intervention.  We will need to address in the future.  Return in 3 weeks for follow-up.  If patient is doing well, we can consider seeing her every every 6 weeks with treatment every 3 weeks.      ORDERS PLACED FOR THIS ENCOUNTER: Orders Placed This Encounter  Procedures  . NM Cardiac Muga Rest    MEDICATIONS PRESCRIBED THIS ENCOUNTER: No orders of the defined types were placed in this  encounter.    THERAPY PLAN:  Continue herceptin x 52 week total and AI therapy x 5 years.  All questions were answered. The patient knows to call the clinic with any problems, questions or concerns. We can certainly see the patient much sooner if necessary.  Patient and plan discussed with Dr. Ancil Linsey and she is in agreement with the aforementioned.   This note is electronically signed by: Doy Mince 08/30/2015 9:47 PM

## 2015-08-30 NOTE — Progress Notes (Signed)
Carly Wood Tolerated chemotherapy well

## 2015-08-30 NOTE — Assessment & Plan Note (Addendum)
Stage I (T1CN0M0) invasive ductal carcinoma of the right breast, ER/PR+, HER2+. S/P right mastectomy on 10/24/2014, followed by adjuvant chemotherapy consisting of weekly Taxol/Herceptin beginning on 12/28/2015.  Now on Herceptin x 52 weeks and AI therapy.  Oncology history is up to date.  Staging in [facility] problem list is completed.  I personally reviewed and went over laboratory results with the patient.  The results are noted within this dictation.  She is due for MUGA scan prior to her next cycle of chemotherapy.  As a result, order is placed and will be scheduled accordingly.  The patient has osteopenia while being on an AI.  She is a candidate for Prolia intervention.  Today, the patient is unable to participate in a meaningful conversation regarding this intervention.  We will need to address in the future.  Return in 3 weeks for follow-up.  If patient is doing well, we can consider seeing her every every 6 weeks with treatment every 3 weeks.

## 2015-08-30 NOTE — Patient Instructions (Signed)
[place] at [facility] Discharge Instructions  RECOMMENDATIONS MADE BY THE CONSULTANT AND ANY TEST RESULTS WILL BE SENT TO YOUR REFERRING PHYSICIAN.  herceptin today Echo scheduled herceptin in 3 weeks Follow up as scheduled Please call the clinic if you have any questions or concerns   Thank you for choosing [place] at [facility] to provide your oncology and hematology care.  To afford each patient quality time with our provider, please arrive at least 15 minutes before your scheduled appointment time.   Beginning January 23rd 2017 lab work for the [facility] will be done in the  Main lab at [facility] on 1st floor. If you have a lab appointment with the [facility] please come in thru the  Main Entrance and check in at the main information desk  You need to re-schedule your appointment should you arrive 10 or more minutes late.  We strive to give you quality time with our providers, and arriving late affects you and other patients whose appointments are after yours.  Also, if you no show three or more times for appointments you may be dismissed from the clinic at the providers discretion.     Again, thank you for choosing [facility].  Our hope is that these requests will decrease the amount of time that you wait before being seen by our physicians.       _____________________________________________________________  Should you have questions after your visit to [facility], please contact our office at [phone] between the hours of 8:30 a.m. and 4:30 p.m.  Voicemails left after 4:30 p.m. will not be returned until the following business day.  For prescription refill requests, have your pharmacy contact our office.         Resources For Cancer Patients and their Caregivers ? American Cancer Society: Can assist with transportation, wigs, general needs, runs Look Good Feel Better.         571-079-2388 ? Cancer Care: Provides financial assistance, online support groups, medication/co-pay assistance.  1-800-813-HOPE (954)285-9699) ? [place] Assists [place] cancer patients and their families through emotional , educational and financial support.  469 601 5655 ? [place] DSS Where to apply for food stamps, Medicaid and utility assistance. (330)691-7347 ? RCATS: Transportation to medical appointments. 8673552945 ? Social Security Administration: May apply for disability if have a Stage IV cancer. (269)717-2225 (919) 531-2654 ? [employer], Disability and Transit Services: Assists with nutrition, care and transit needs. [place] Support Programs: @10RELATIVEDAYS @ > Cancer Support Group  2nd Tuesday of the month 1pm-2pm, Journey Room  > Creative Journey  3rd Tuesday of the month 1130am-1pm, Journey Room  > Look Good Feel Better  1st Wednesday of the month 10am-12 noon, Journey Room (Call [place] to register [phone])

## 2015-09-02 NOTE — Progress Notes (Signed)
24 hours call back- no problems from chemotherapy

## 2015-09-03 ENCOUNTER — Other Ambulatory Visit (HOSPITAL_COMMUNITY): Payer: Self-pay | Admitting: Oncology

## 2015-09-12 ENCOUNTER — Encounter (HOSPITAL_COMMUNITY): Payer: Self-pay

## 2015-09-12 ENCOUNTER — Encounter (HOSPITAL_COMMUNITY)
Admission: RE | Admit: 2015-09-12 | Discharge: 2015-09-12 | Disposition: A | Discharge status: Home / Self Care | Payer: Medicare Other | Source: Ambulatory Visit | Attending: Oncology | Admitting: Oncology

## 2015-09-12 DIAGNOSIS — C50911 Malignant neoplasm of unspecified site of right female breast: Secondary | ICD-10-CM | POA: Insufficient documentation

## 2015-09-12 DIAGNOSIS — Z79899 Other long term (current) drug therapy: Secondary | ICD-10-CM | POA: Insufficient documentation

## 2015-09-12 MED ORDER — TECHNETIUM TC 99M-LABELED RED BLOOD CELLS IV KIT
25.0000 | PACK | Freq: Once | INTRAVENOUS | Status: AC | PRN
  Administered 2015-09-12: 26.2 via INTRAVENOUS

## 2015-09-12 MED ORDER — HEPARIN SOD (PORK) LOCK FLUSH 100 UNIT/ML IV SOLN
INTRAVENOUS | Status: AC
  Filled 2015-09-12: qty 5

## 2015-09-16 ENCOUNTER — Encounter: Payer: Self-pay | Admitting: Oncology

## 2015-09-16 ENCOUNTER — Encounter: Payer: Self-pay | Admitting: Hematology & Oncology

## 2015-09-20 ENCOUNTER — Encounter (HOSPITAL_COMMUNITY): Payer: Medicare Other | Attending: Hematology & Oncology | Admitting: Hematology & Oncology

## 2015-09-20 ENCOUNTER — Encounter (HOSPITAL_COMMUNITY): Payer: Self-pay | Admitting: Hematology & Oncology

## 2015-09-20 ENCOUNTER — Encounter (HOSPITAL_BASED_OUTPATIENT_CLINIC_OR_DEPARTMENT_OTHER): Payer: Medicare Other

## 2015-09-20 VITALS — BP 163/64 | HR 58 | Temp 97.8°F | Resp 16

## 2015-09-20 VITALS — BP 153/63 | HR 64 | Temp 98.5°F | Resp 16 | Wt 189.3 lb

## 2015-09-20 DIAGNOSIS — Z7982 Long term (current) use of aspirin: Secondary | ICD-10-CM | POA: Insufficient documentation

## 2015-09-20 DIAGNOSIS — Z79899 Other long term (current) drug therapy: Secondary | ICD-10-CM | POA: Insufficient documentation

## 2015-09-20 DIAGNOSIS — D649 Anemia, unspecified: Secondary | ICD-10-CM | POA: Diagnosis not present

## 2015-09-20 DIAGNOSIS — E1122 Type 2 diabetes mellitus with diabetic chronic kidney disease: Secondary | ICD-10-CM | POA: Insufficient documentation

## 2015-09-20 DIAGNOSIS — N183 Chronic kidney disease, stage 3 (moderate): Secondary | ICD-10-CM

## 2015-09-20 DIAGNOSIS — C50411 Malignant neoplasm of upper-outer quadrant of right female breast: Secondary | ICD-10-CM

## 2015-09-20 DIAGNOSIS — Z170 Estrogen receptor positive status [ER+]: Secondary | ICD-10-CM

## 2015-09-20 DIAGNOSIS — M8580 Other specified disorders of bone density and structure, unspecified site: Secondary | ICD-10-CM | POA: Insufficient documentation

## 2015-09-20 DIAGNOSIS — Z5112 Encounter for antineoplastic immunotherapy: Secondary | ICD-10-CM | POA: Diagnosis not present

## 2015-09-20 DIAGNOSIS — Z79811 Long term (current) use of aromatase inhibitors: Secondary | ICD-10-CM

## 2015-09-20 DIAGNOSIS — C50911 Malignant neoplasm of unspecified site of right female breast: Secondary | ICD-10-CM

## 2015-09-20 DIAGNOSIS — N189 Chronic kidney disease, unspecified: Secondary | ICD-10-CM | POA: Insufficient documentation

## 2015-09-20 MED ORDER — ACETAMINOPHEN 325 MG PO TABS
ORAL_TABLET | ORAL | Status: AC
  Filled 2015-09-20: qty 2

## 2015-09-20 MED ORDER — SODIUM CHLORIDE 0.9% FLUSH
10.0000 mL | INTRAVENOUS | Status: DC | PRN
  Administered 2015-09-20: 10 mL
  Filled 2015-09-20: qty 10

## 2015-09-20 MED ORDER — ACETAMINOPHEN 325 MG PO TABS
650.0000 mg | ORAL_TABLET | Freq: Once | ORAL | Status: AC
  Administered 2015-09-20: 650 mg via ORAL

## 2015-09-20 MED ORDER — TRASTUZUMAB CHEMO INJECTION 440 MG
6.0000 mg/kg | Freq: Once | INTRAVENOUS | Status: AC
  Administered 2015-09-20: 504 mg via INTRAVENOUS
  Filled 2015-09-20: qty 24

## 2015-09-20 MED ORDER — HEPARIN SOD (PORK) LOCK FLUSH 100 UNIT/ML IV SOLN
500.0000 [IU] | Freq: Once | INTRAVENOUS | Status: AC | PRN
  Administered 2015-09-20: 500 [IU]
  Filled 2015-09-20: qty 5

## 2015-09-20 MED ORDER — DIPHENHYDRAMINE HCL 25 MG PO CAPS
ORAL_CAPSULE | ORAL | Status: AC
  Filled 2015-09-20: qty 2

## 2015-09-20 MED ORDER — SODIUM CHLORIDE 0.9 % IV SOLN
Freq: Once | INTRAVENOUS | Status: AC
  Administered 2015-09-20: 11:00:00 via INTRAVENOUS

## 2015-09-20 MED ORDER — DIPHENHYDRAMINE HCL 25 MG PO CAPS
50.0000 mg | ORAL_CAPSULE | Freq: Once | ORAL | Status: AC
  Administered 2015-09-20: 50 mg via ORAL

## 2015-09-20 NOTE — Patient Instructions (Signed)
[place] at [facility] Discharge Instructions  RECOMMENDATIONS MADE BY THE CONSULTANT AND ANY TEST RESULTS WILL BE SENT TO YOUR REFERRING PHYSICIAN.  Your heart function was great!!! You are tolerating the Herceptin without any problems.  You will not have to be seen by Dr. Whitney Muse every time you come for chemo.   The doctor will see you back in 6 weeks  Thank you for choosing [place] at [facility] to provide your oncology and hematology care.  To afford each patient quality time with our provider, please arrive at least 15 minutes before your scheduled appointment time.   Beginning January 23rd 2017 lab work for the [facility] will be done in the  Main lab at [facility] on 1st floor. If you have a lab appointment with the [facility] please come in thru the  Main Entrance and check in at the main information desk  You need to re-schedule your appointment should you arrive 10 or more minutes late.  We strive to give you quality time with our providers, and arriving late affects you and other patients whose appointments are after yours.  Also, if you no show three or more times for appointments you may be dismissed from the clinic at the providers discretion.     Again, thank you for choosing [facility].  Our hope is that these requests will decrease the amount of time that you wait before being seen by our physicians.       _____________________________________________________________  Should you have questions after your visit to [facility], please contact our office at [phone] between the hours of 8:30 a.m. and 4:30 p.m.  Voicemails left after 4:30 p.m. will not be returned until the following business day.  For prescription refill requests, have your pharmacy contact our office.         Resources For Cancer Patients and their Caregivers ? American Cancer Society: Can assist with  transportation, wigs, general needs, runs Look Good Feel Better.        608 513 7164 ? Cancer Care: Provides financial assistance, online support groups, medication/co-pay assistance.  1-800-813-HOPE 458-627-1439) ? [place] Assists [place] cancer patients and their families through emotional , educational and financial support.  3201242431 ? [place] DSS Where to apply for food stamps, Medicaid and utility assistance. (254)185-8517 ? RCATS: Transportation to medical appointments. (929)071-1359 ? Social Security Administration: May apply for disability if have a Stage IV cancer. (639)172-9189 618-660-8849 ? [employer], Disability and Transit Services: Assists with nutrition, care and transit needs. [place] Support Programs: @10RELATIVEDAYS @ > Cancer Support Group  2nd Tuesday of the month 1pm-2pm, Journey Room  > Creative Journey  3rd Tuesday of the month 1130am-1pm, Journey Room  > Look Good Feel Better  1st Wednesday of the month 10am-12 noon, Journey Room (Call [place] to register [phone])

## 2015-09-20 NOTE — Progress Notes (Signed)
[place]  CONSULT NOTE  CHIEF COMPLAINTS/PURPOSE OF CONSULTATION:    Stage 1 infiltrating ductal carcinoma of right female breast ([place])   09/12/2014 Mammogram Mass in upper outer R breast, middle third depth appears slightly larger than it was on prior exam with more irreg spiculated margins   10/02/2014 Pathology Results biopsy with invasive ductal carcinoma high grade 1.1 cm, dcis solid type. additional R breast tissue, excision, invasive ductal high grade 0.9 cm    10/24/2014 Pathology Results no residual invasive carcinoma, DCIS, focal, atypical ductal hyperplasia, 0/7 LN positive for metastatic carcinoma ER > 90%, PR 30%, HER 2 2+   10/24/2014 Cancer Staging [id number]   10/24/2014 Surgery R mastectomy, T1c, N0   12/28/2014 -  Chemotherapy Taxol/herceptin weekly X 12, Herceptin every 21 days, last due in September 2017   03/11/2015 -  Anti-estrogen oral therapy Arimidex 1 mg daily   09/12/2015 Imaging MUGA- The left ventricular ejection fraction equals 68%.   HISTORY OF PRESENTING ILLNESS:  Carly Wood 80 y.o. female is here for ongoing care for a stage I ER+ PR+ HER 2 + carcinoma of the R breast. She underwent a mastectomy last August. She is currently on herceptin/arimidex. Patient's malignancy was in the upper outer quadrant of the R breast, she had undergone a biopsy previously of this area but it was benign. Repeat mammogram on 09/12/2014 showed growth in the spiculated mass and repeat biopsy was recommended.   She is here today accompanied by her sisters Carly Wood and Carly Wood, who she lives with. They remark that their sister Carly Wood is not here. Marland Kitchen   She wonders if her heart test came back and if she's ready for her chemo.  Carly Wood notes that she fell; she was going out to eat, hit the concrete and hurt her side; "but I'm not in pain anymore." She says "first thing I knew, I was on the pavement." They all went in and ate. Her sister Carly Wood notes "this isn't recently." She  confirms again that all the soreness is gone and she's patched up now. She says "it's not nice falling, when you're old as I am."  She does not need refills on anything today.  Her sisters are very pleased with the fact that she's sailing through therapy.  In terms of her physical activity, Carly Wood notes "I walk. I don't walk enough." Her sister Carly Wood notes that Carly Wood often wants to lie down. The patient notes "the reason I lie down is I feel bored." This is an ongoing point of contention between the sisters as they want her to be a lot more active than she is.  Appetite is unchanged. Activity level is baseline.    MEDICAL HISTORY:  Past Medical History  Diagnosis Date  . Brain cancer ([place])     right   . Diabetes mellitus without complication ([place])   . Anemia   . Chronic kidney disease   . Stage 1 infiltrating ductal carcinoma of right female breast ([place]) 08/21/2015    ER+ PR+ HER 2 neu + (3+) T1cN0     SURGICAL HISTORY: History reviewed. No pertinent past surgical history.  SOCIAL HISTORY: Social History   Social History  . Marital Status: Single    Spouse Name: N/A  . Number of Children: N/A  . Years of Education: N/A   Occupational History  . Not on file.   Social History Main Topics  . Smoking status: Never Smoker   . Smokeless  tobacco: Not on file  . Alcohol Use: Not on file  . Drug Use: Not on file  . Sexual Activity: Not on file   Other Topics Concern  . Not on file   Social History Narrative   Carly Wood, the patient, never married; says she never wanted to. She remarks that this was a choice she made; "not that I didn't have the opportunities, I just made the choice not to do it." NO tobacco, cigarettes or chewing tobacco. No ETOH  The 3 sisters live together. Carly Wood does all the cooking.  She has nieces and nephews; one teaches school in [place]. Carly Wood is her oldest brother; he was a principal. Has retired. Carly Wood has a wife who was a Pharmacist, hospital;  they have 3 children, including a son who graduated from [employer], and a daughter who graduated from [employer] who is a Sport and exercise psychologist, and another daughter who has a PhD in education. 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  The patient, Carly Wood, was a good Ship broker; graduated high school and college. 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 Taught in [place], [place].  She notes she had hobbies; used to do artwork. Drawing pictures, mostly flowers, houses. Pen and pencil, colored pencils.  FAMILY HISTORY: History reviewed. No pertinent family history.  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  Father was 62 when he died; heart disease. Mother was 77 when she died. Diabetic, with congestive heart failure. Had a stroke as well. Sister Carly Wood was caregiver for their mother for 10 years after her stroke. Her sister Carly Wood has multiple myeloma and also had a CNS lymphoma, and notes that she is on Revlamid. Carly Wood is 16 years out from her brain surgery.  7 brothers and sisters total; one is deceased. 6 siblings left. 1 that passed died of prostate cancer. "He lasted 12 years."  ALLERGIES:  is allergic to penicillins; ace inhibitors; and lisinopril.  MEDICATIONS:  Current Outpatient Prescriptions  Medication Sig Dispense Refill  . acetaminophen (TYLENOL) 500 MG tablet Take by mouth.    Marland Kitchen amLODipine (NORVASC) 5 MG tablet     . anastrozole (ARIMIDEX) 1 MG tablet Take by mouth.    Marland Kitchen anti-nausea (EMETROL) solution Take by mouth.    Marland Kitchen aspirin 81 MG chewable tablet Chew by mouth.    . calcium-vitamin D (OSCAL WITH D) 500-200 MG-UNIT tablet Take by  mouth.    . cloNIDine (CATAPRES) 0.3 MG tablet Take by mouth.    . docusate sodium (COLACE) 100 MG capsule Take by mouth.    . esomeprazole (NEXIUM) 40 MG capsule Take by mouth.    Marland Kitchen glimepiride (AMARYL) 4 MG tablet Take by mouth.    . lidocaine-prilocaine (EMLA) cream Apply 1 application topically as needed. Apply to port site prior when needed for chemo, labs or port flushes. 30 g 2  . LORazepam (ATIVAN) 0.5 MG tablet Take by mouth.    . nebivolol (BYSTOLIC) 10 MG tablet Take by mouth.    Marland Kitchen PARoxetine (PAXIL) 20 MG tablet Take by mouth.    . pioglitazone (ACTOS) 15 MG tablet Take by  mouth.    . pioglitazone (ACTOS) 30 MG tablet Take 15 mg by mouth 1 day or 1 dose.    . pravastatin (PRAVACHOL) 20 MG tablet Take by mouth. Reported on 08/30/2015    . pravastatin (PRAVACHOL) 20 MG tablet Reported on 08/30/2015    . traZODone (DESYREL) 50 MG tablet     . VITAMIN B COMPLEX-C PO Take by mouth.     No current facility-administered medications for this visit.   Facility-Administered Medications Ordered in Other Visits  Medication Dose Route Frequency Provider Last Rate Last Dose  . 0.9 %  sodium chloride infusion   Intravenous Once Patrici Ranks, MD      . acetaminophen (TYLENOL) tablet 650 mg  650 mg Oral Once Patrici Ranks, MD      . diphenhydrAMINE (BENADRYL) capsule 50 mg  50 mg Oral Once Patrici Ranks, MD      . heparin lock flush 100 unit/mL  500 Units Intracatheter Once PRN Patrici Ranks, MD      . sodium chloride flush (NS) 0.9 % injection 10 mL  10 mL Intracatheter PRN Patrici Ranks, MD      . trastuzumab (HERCEPTIN) 504 mg in sodium chloride 0.9 % 250 mL chemo infusion  6 mg/kg (Treatment Plan Actual) Intravenous Once Patrici Ranks, MD        Review of Systems  Constitutional: Negative.  Negative for fever, chills, weight loss and malaise/fatigue.  HENT: Negative.  Negative for congestion, hearing loss, nosebleeds, sore throat and tinnitus.   Eyes: Negative.   Negative for blurred vision, double vision, pain and discharge.  Respiratory: Negative.  Negative for cough, hemoptysis, sputum production, shortness of breath and wheezing.   Cardiovascular: Negative.  Negative for chest pain, palpitations, claudication, leg swelling and PND.  Gastrointestinal: Negative.  Negative for heartburn, nausea, vomiting, abdominal pain, diarrhea, constipation, blood in stool and melena.  Genitourinary: Negative.  Negative for dysuria, urgency, frequency and hematuria.  Musculoskeletal: Negative.  Negative for myalgias, joint pain and falls.  Skin: Negative.  Negative for itching and rash.  Neurological: Negative.  Negative for dizziness, tingling, tremors, sensory change, speech change, focal weakness, seizures, loss of consciousness, weakness and headaches.  Endo/Heme/Allergies: Negative.  Does not bruise/bleed easily.  Psychiatric/Behavioral: Negative.  Negative for depression, suicidal ideas, memory loss and substance abuse. The patient is not nervous/anxious and does not have insomnia.   All other systems reviewed and are negative.  14 point ROS was done and is otherwise as detailed above or in HPI   PHYSICAL EXAMINATION: ECOG PERFORMANCE STATUS: 1 - Symptomatic but completely ambulatory  Filed Vitals:   09/20/15 0959  BP: 153/63  Pulse: 64  Temp: 98.5 F (36.9 C)  Resp: 16   Filed Weights   09/20/15 0959  Weight: 189 lb 4.8 oz (85.866 kg)     Physical Exam  Constitutional: She is oriented to person, place, and time and well-developed, well-nourished, and in no distress.  HENT:  Head: Normocephalic and atraumatic.  Right Ear: External ear normal.  Nose: Nose normal.  Mouth/Throat: Oropharynx is clear and moist. No oropharyngeal exudate.  Eyes: Conjunctivae and EOM are normal. Pupils are equal, round, and reactive to light. Right eye exhibits no discharge. Left eye exhibits no discharge. No scleral icterus.  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  Neck: Normal range of motion. Neck supple. No tracheal deviation present. No thyromegaly present.  Cardiovascular: Normal rate, regular rhythm and normal  heart sounds.  Exam reveals no gallop and no friction rub.   No murmur heard. Pulmonary/Chest: Effort normal and breath sounds normal. She has no wheezes. She has no rales.    Abdominal: Soft. Bowel sounds are normal. She exhibits no distension and no mass. There is no tenderness. There is no rebound and no guarding.  Musculoskeletal: Normal range of motion. She exhibits no edema.  Lymphadenopathy:    She has no cervical adenopathy.  Lymphedema in her right arm  Neurological: She is alert and oriented to person, place, and time. She has normal reflexes. No cranial nerve deficit. Gait normal. Coordination normal.  Skin: Skin is warm and dry. No rash noted.  Psychiatric: Mood, memory, affect and judgment normal.  Nursing note and vitals reviewed.   LABORATORY DATA:  I have reviewed the data as listed Lab Results  Component Value Date   WBC 5.4 08/30/2015   HGB 9.0* 08/30/2015   HCT 28.2* 08/30/2015   MCV 94.3 08/30/2015   PLT 237 08/30/2015   CMP     Component Value Date/Time   NA 138 08/30/2015 1148   K 4.0 08/30/2015 1148   CL 107 08/30/2015 1148   CO2 24 08/30/2015 1148   GLUCOSE 209* 08/30/2015 1148   BUN 36* 08/30/2015 1148   CREATININE 2.26* 08/30/2015 1148   CALCIUM 8.3* 08/30/2015 1148   PROT 6.8 08/30/2015 1148   ALBUMIN 3.1* 08/30/2015 1148   AST 18 08/30/2015 1148   ALT 13* 08/30/2015 1148   ALKPHOS 87 08/30/2015 1148   BILITOT 0.4 08/30/2015 1148   GFRNONAA 20* 08/30/2015 1148   GFRAA 23* 08/30/2015 1148   RADIOGRAPHIC STUDIES: I have personally reviewed the radiological images as listed and agreed with the findings in the report. CLINICAL DATA:  Right breast cancer.  Chemotherapy.  Baseline.  EXAM: NUCLEAR MEDICINE CARDIAC BLOOD POOL IMAGING ([facility])  TECHNIQUE: Cardiac multi-gated  acquisition was performed at rest following intravenous injection of Tc-13mlabeled red blood cells.  RADIOPHARMACEUTICALS:  26.2 mCi Tc-962mDP in-vitro labeled red blood cells IV  COMPARISON:  None.  FINDINGS: The left ventricular ejection fraction was calculated equal 68%. Normal left ventricular wall motion.  IMPRESSION: The left ventricular ejection fraction equals 68%.   Electronically Signed   By: TaKerby Moors.D.   On: 09/12/2015 11:48   ASSESSMENT & PLAN:  Stage I ER+ PR+ HER 2 + carcinoma of R breast, upper outer quadrant R mastectomy Arimidex Herceptin Anemia CKD, Stage III DEXA with osteopenia 05/2015 Last 2-D echo 05/2015 Lymphedema  L breast screening mammogram due 09/2015  Her ejection fraction is 68% and she's tolerated the Herceptin very well. Will continue with ongoing herceptin therapy. She is to continue arimidex, calcium and vitamin D. She has osteopenia, we will discuss a bisphosphonate vs. prolia moving forward.  She is not open to PT to help with her gait/balance.   We'll see her back in 6 weeks for a physical exam, will continue with q 3 week herceptin.  We discussed the possibility of visiting the senior center [place] in [place] some physical activity. I'll print out some information and give it to them.  All questions were answered. The patient knows to call the clinic with any problems, questions or concerns.  This document serves as a record of services personally performed by ShAncil LinseyMD. It was created on her behalf by KaToni Amenda trained medical scribe. The creation of this record is based on the scribe's personal observations and  the provider's statements to them. This document has been checked and approved by the attending provider.  I have reviewed the above documentation for accuracy and completeness and I agree with the above.  This note was electronically signed.  Molli Hazard, MD  09/20/2015  10:18 AM

## 2015-09-20 NOTE — Progress Notes (Signed)
Tolerated herceptin well. Stable on discharge home with family via wheelchair.

## 2015-10-09 ENCOUNTER — Other Ambulatory Visit: Payer: Self-pay | Admitting: Hematology & Oncology

## 2015-10-11 ENCOUNTER — Encounter (HOSPITAL_COMMUNITY): Payer: Medicare Other | Attending: Hematology & Oncology

## 2015-10-11 VITALS — BP 135/59 | HR 72 | Temp 98.1°F | Resp 18 | Wt 191.2 lb

## 2015-10-11 DIAGNOSIS — Z5112 Encounter for antineoplastic immunotherapy: Secondary | ICD-10-CM | POA: Diagnosis not present

## 2015-10-11 DIAGNOSIS — M8580 Other specified disorders of bone density and structure, unspecified site: Secondary | ICD-10-CM | POA: Diagnosis not present

## 2015-10-11 DIAGNOSIS — N189 Chronic kidney disease, unspecified: Secondary | ICD-10-CM | POA: Insufficient documentation

## 2015-10-11 DIAGNOSIS — E1122 Type 2 diabetes mellitus with diabetic chronic kidney disease: Secondary | ICD-10-CM | POA: Diagnosis not present

## 2015-10-11 DIAGNOSIS — C50911 Malignant neoplasm of unspecified site of right female breast: Secondary | ICD-10-CM | POA: Diagnosis present

## 2015-10-11 DIAGNOSIS — C50411 Malignant neoplasm of upper-outer quadrant of right female breast: Secondary | ICD-10-CM | POA: Diagnosis not present

## 2015-10-11 DIAGNOSIS — Z7982 Long term (current) use of aspirin: Secondary | ICD-10-CM | POA: Diagnosis not present

## 2015-10-11 DIAGNOSIS — Z170 Estrogen receptor positive status [ER+]: Secondary | ICD-10-CM | POA: Insufficient documentation

## 2015-10-11 DIAGNOSIS — Z79899 Other long term (current) drug therapy: Secondary | ICD-10-CM | POA: Diagnosis not present

## 2015-10-11 LAB — CBC WITH DIFFERENTIAL/PLATELET
Basophils Absolute: 0 10*3/uL (ref 0.0–0.1)
Basophils Relative: 0 %
EOS PCT: 3 %
Eosinophils Absolute: 0.2 10*3/uL (ref 0.0–0.7)
HCT: 29.6 % — ABNORMAL LOW (ref 36.0–46.0)
Hemoglobin: 9.7 g/dL — ABNORMAL LOW (ref 12.0–15.0)
LYMPHS ABS: 1 10*3/uL (ref 0.7–4.0)
LYMPHS PCT: 16 %
MCH: 31.2 pg (ref 26.0–34.0)
MCHC: 32.8 g/dL (ref 30.0–36.0)
MCV: 95.2 fL (ref 78.0–100.0)
Monocytes Absolute: 0.3 10*3/uL (ref 0.1–1.0)
Monocytes Relative: 5 %
Neutro Abs: 4.5 10*3/uL (ref 1.7–7.7)
Neutrophils Relative %: 76 %
PLATELETS: 246 10*3/uL (ref 150–400)
RBC: 3.11 MIL/uL — AB (ref 3.87–5.11)
RDW: 15.7 % — ABNORMAL HIGH (ref 11.5–15.5)
WBC: 6 10*3/uL (ref 4.0–10.5)

## 2015-10-11 LAB — COMPREHENSIVE METABOLIC PANEL
ALK PHOS: 84 U/L (ref 38–126)
ALT: 13 U/L — AB (ref 14–54)
ANION GAP: 3 — AB (ref 5–15)
AST: 18 U/L (ref 15–41)
Albumin: 3.2 g/dL — ABNORMAL LOW (ref 3.5–5.0)
BILIRUBIN TOTAL: 0.3 mg/dL (ref 0.3–1.2)
BUN: 32 mg/dL — ABNORMAL HIGH (ref 6–20)
CHLORIDE: 108 mmol/L (ref 101–111)
CO2: 26 mmol/L (ref 22–32)
Calcium: 8.2 mg/dL — ABNORMAL LOW (ref 8.9–10.3)
Creatinine, Ser: 2.38 mg/dL — ABNORMAL HIGH (ref 0.44–1.00)
GFR calc Af Amer: 22 mL/min — ABNORMAL LOW (ref >60)
GFR, EST NON AFRICAN AMERICAN: 19 mL/min — AB (ref >60)
GLUCOSE: 243 mg/dL — AB (ref 65–99)
POTASSIUM: 3.8 mmol/L (ref 3.5–5.1)
Sodium: 137 mmol/L (ref 135–145)
Total Protein: 7.2 g/dL (ref 6.5–8.1)

## 2015-10-11 MED ORDER — ACETAMINOPHEN 325 MG PO TABS
650.0000 mg | ORAL_TABLET | Freq: Once | ORAL | Status: AC
  Administered 2015-10-11: 650 mg via ORAL

## 2015-10-11 MED ORDER — DIPHENHYDRAMINE HCL 25 MG PO CAPS
ORAL_CAPSULE | ORAL | Status: AC
  Filled 2015-10-11: qty 2

## 2015-10-11 MED ORDER — SODIUM CHLORIDE 0.9 % IV SOLN
Freq: Once | INTRAVENOUS | Status: AC
  Administered 2015-10-11: 12:00:00 via INTRAVENOUS

## 2015-10-11 MED ORDER — DIPHENHYDRAMINE HCL 25 MG PO CAPS
50.0000 mg | ORAL_CAPSULE | Freq: Once | ORAL | Status: AC
  Administered 2015-10-11: 50 mg via ORAL

## 2015-10-11 MED ORDER — SODIUM CHLORIDE 0.9% FLUSH
10.0000 mL | INTRAVENOUS | Status: DC | PRN
  Administered 2015-10-11: 10 mL
  Filled 2015-10-11: qty 10

## 2015-10-11 MED ORDER — HEPARIN SOD (PORK) LOCK FLUSH 100 UNIT/ML IV SOLN
500.0000 [IU] | Freq: Once | INTRAVENOUS | Status: AC | PRN
  Administered 2015-10-11: 500 [IU]

## 2015-10-11 MED ORDER — SODIUM CHLORIDE 0.9 % IV SOLN
6.0000 mg/kg | Freq: Once | INTRAVENOUS | Status: AC
  Administered 2015-10-11: 504 mg via INTRAVENOUS
  Filled 2015-10-11: qty 24

## 2015-10-11 MED ORDER — ACETAMINOPHEN 325 MG PO TABS
ORAL_TABLET | ORAL | Status: AC
  Filled 2015-10-11: qty 2

## 2015-10-11 NOTE — Patient Instructions (Signed)
[facility] Discharge Instructions for Patients Receiving Chemotherapy   Beginning January 23rd 2017 lab work for the Cancer Center will be done in the  Main lab at [facility] on 1st floor. If you have a lab appointment with the Cancer Center please come in thru the  Main Entrance and check in at the main information desk   Today you received the following chemotherapy agents Herceptin. If you develop nausea and vomiting, or diarrhea that is not controlled by your medication, call the clinic.  The clinic phone number is [phone]. Office hours are Monday-Friday 8:30am-5:00pm.  BELOW ARE SYMPTOMS THAT SHOULD BE REPORTED IMMEDIATELY:  *FEVER GREATER THAN 101.0 F  *CHILLS WITH OR WITHOUT FEVER  NAUSEA AND VOMITING THAT IS NOT CONTROLLED WITH YOUR NAUSEA MEDICATION  *UNUSUAL SHORTNESS OF BREATH  *UNUSUAL BRUISING OR BLEEDING  TENDERNESS IN MOUTH AND THROAT WITH OR WITHOUT PRESENCE OF ULCERS  *URINARY PROBLEMS  *BOWEL PROBLEMS  UNUSUAL RASH Items with * indicate a potential emergency and should be followed up as soon as possible. If you have an emergency after office hours please contact your primary care physician or go to the nearest emergency department.  Please call the clinic during office hours if you have any questions or concerns.   You may also contact the Patient Navigator at [phone] should you have any questions or need assistance in obtaining follow up care.      Resources For Cancer Patients and their Caregivers ? American Cancer Society: Can assist with transportation, wigs, general needs, runs Look Good Feel Better.        1-888-227-6333 ? Cancer Care: Provides financial assistance, online support groups, medication/co-pay assistance.  1-800-813-HOPE (4673) ? [facility] Assists [place] cancer patients and their families through emotional , educational and financial support.   336-427-4357 ? [place] DSS Where to apply for food stamps, Medicaid and utility assistance. 336-342-1394 ? RCATS: Transportation to medical appointments. 336-347-2287 ? Social Security Administration: May apply for disability if have a Stage IV cancer. 336-342-7796 1-800-772-1213 ? [place] Aging, Disability and Transit Services: Assists with nutrition, care and transit needs. 336-349-2343         

## 2015-10-11 NOTE — Progress Notes (Signed)
Tolerated herceptin infusion well. Stable on discharge home with family via wheelchair.

## 2015-10-20 ENCOUNTER — Encounter (HOSPITAL_COMMUNITY): Payer: Self-pay | Admitting: Hematology & Oncology

## 2015-11-01 ENCOUNTER — Encounter (HOSPITAL_COMMUNITY): Payer: Self-pay | Admitting: Hematology & Oncology

## 2015-11-01 ENCOUNTER — Encounter (HOSPITAL_BASED_OUTPATIENT_CLINIC_OR_DEPARTMENT_OTHER): Payer: Medicare Other | Admitting: Hematology & Oncology

## 2015-11-01 ENCOUNTER — Encounter (HOSPITAL_BASED_OUTPATIENT_CLINIC_OR_DEPARTMENT_OTHER): Payer: Medicare Other

## 2015-11-01 VITALS — BP 181/73 | HR 72 | Temp 98.1°F | Resp 18 | Wt 193.8 lb

## 2015-11-01 VITALS — BP 177/78 | HR 65 | Temp 97.5°F | Resp 18

## 2015-11-01 DIAGNOSIS — Z5112 Encounter for antineoplastic immunotherapy: Secondary | ICD-10-CM

## 2015-11-01 DIAGNOSIS — Z170 Estrogen receptor positive status [ER+]: Secondary | ICD-10-CM | POA: Diagnosis not present

## 2015-11-01 DIAGNOSIS — Z79811 Long term (current) use of aromatase inhibitors: Secondary | ICD-10-CM

## 2015-11-01 DIAGNOSIS — C50911 Malignant neoplasm of unspecified site of right female breast: Secondary | ICD-10-CM

## 2015-11-01 DIAGNOSIS — N183 Chronic kidney disease, stage 3 unspecified: Secondary | ICD-10-CM

## 2015-11-01 DIAGNOSIS — M8580 Other specified disorders of bone density and structure, unspecified site: Secondary | ICD-10-CM

## 2015-11-01 DIAGNOSIS — C50411 Malignant neoplasm of upper-outer quadrant of right female breast: Secondary | ICD-10-CM

## 2015-11-01 DIAGNOSIS — D649 Anemia, unspecified: Secondary | ICD-10-CM

## 2015-11-01 DIAGNOSIS — I10 Essential (primary) hypertension: Secondary | ICD-10-CM

## 2015-11-01 MED ORDER — HEPARIN SOD (PORK) LOCK FLUSH 100 UNIT/ML IV SOLN
500.0000 [IU] | Freq: Once | INTRAVENOUS | Status: AC | PRN
  Administered 2015-11-01: 500 [IU]
  Filled 2015-11-01: qty 5

## 2015-11-01 MED ORDER — SODIUM CHLORIDE 0.9% FLUSH
10.0000 mL | INTRAVENOUS | Status: DC | PRN
  Administered 2015-11-01: 10 mL
  Filled 2015-11-01: qty 10

## 2015-11-01 MED ORDER — TRASTUZUMAB CHEMO INJECTION 440 MG
6.0000 mg/kg | Freq: Once | INTRAVENOUS | Status: AC
  Administered 2015-11-01: 504 mg via INTRAVENOUS
  Filled 2015-11-01: qty 24

## 2015-11-01 MED ORDER — SODIUM CHLORIDE 0.9 % IV SOLN
Freq: Once | INTRAVENOUS | Status: AC
  Administered 2015-11-01: 12:00:00 via INTRAVENOUS

## 2015-11-01 MED ORDER — ACETAMINOPHEN 325 MG PO TABS
650.0000 mg | ORAL_TABLET | Freq: Once | ORAL | Status: AC
  Administered 2015-11-01: 650 mg via ORAL
  Filled 2015-11-01: qty 2

## 2015-11-01 MED ORDER — DIPHENHYDRAMINE HCL 25 MG PO CAPS
50.0000 mg | ORAL_CAPSULE | Freq: Once | ORAL | Status: AC
  Administered 2015-11-01: 50 mg via ORAL
  Filled 2015-11-01: qty 2

## 2015-11-01 NOTE — Patient Instructions (Signed)
[facility] Discharge Instructions for Patients Receiving Chemotherapy   Beginning January 23rd 2017 lab work for the Cancer Center will be done in the  Main lab at [facility] on 1st floor. If you have a lab appointment with the Cancer Center please come in thru the  Main Entrance and check in at the main information desk   Today you received the following chemotherapy agents Herceptin. Follow-up as scheduled. Call clinic for any questions or concerns  To help prevent nausea and vomiting after your treatment, we encourage you to take your nausea medication.   If you develop nausea and vomiting, or diarrhea that is not controlled by your medication, call the clinic.  The clinic phone number is [phone]. Office hours are Monday-Friday 8:30am-5:00pm.  BELOW ARE SYMPTOMS THAT SHOULD BE REPORTED IMMEDIATELY:  *FEVER GREATER THAN 101.0 F  *CHILLS WITH OR WITHOUT FEVER  NAUSEA AND VOMITING THAT IS NOT CONTROLLED WITH YOUR NAUSEA MEDICATION  *UNUSUAL SHORTNESS OF BREATH  *UNUSUAL BRUISING OR BLEEDING  TENDERNESS IN MOUTH AND THROAT WITH OR WITHOUT PRESENCE OF ULCERS  *URINARY PROBLEMS  *BOWEL PROBLEMS  UNUSUAL RASH Items with * indicate a potential emergency and should be followed up as soon as possible. If you have an emergency after office hours please contact your primary care physician or go to the nearest emergency department.  Please call the clinic during office hours if you have any questions or concerns.   You may also contact the Patient Navigator at [phone] should you have any questions or need assistance in obtaining follow up care.      Resources For Cancer Patients and their Caregivers ? American Cancer Society: Can assist with transportation, wigs, general needs, runs Look Good Feel Better.        1-888-227-6333 ? Cancer Care: Provides financial assistance, online support groups, medication/co-pay assistance.  1-800-813-HOPE  (4673) ? [facility] Assists [place] cancer patients and their families through emotional , educational and financial support.  336-427-4357 ? [place] DSS Where to apply for food stamps, Medicaid and utility assistance. 336-342-1394 ? RCATS: Transportation to medical appointments. 336-347-2287 ? Social Security Administration: May apply for disability if have a Stage IV cancer. 336-342-7796 1-800-772-1213 ? [place] Aging, Disability and Transit Services: Assists with nutrition, care and transit needs. 336-349-2343         

## 2015-11-01 NOTE — Patient Instructions (Addendum)
[place] at [facility] Discharge Instructions  RECOMMENDATIONS MADE BY THE CONSULTANT AND ANY TEST RESULTS WILL BE SENT TO YOUR REFERRING PHYSICIAN.  You saw Dr. Whitney Muse and had treatment today. Return to cancer center in 3 weeks for treatment. Follow up with Tom with lab work in 6 weeks.  Thank you for choosing [place] at [facility] to provide your oncology and hematology care.  To afford each patient quality time with our provider, please arrive at least 15 minutes before your scheduled appointment time.   Beginning January 23rd 2017 lab work for the [facility] will be done in the  Main lab at [facility] on 1st floor. If you have a lab appointment with the [facility] please come in thru the  Main Entrance and check in at the main information desk  You need to re-schedule your appointment should you arrive 10 or more minutes late.  We strive to give you quality time with our providers, and arriving late affects you and other patients whose appointments are after yours.  Also, if you no show three or more times for appointments you may be dismissed from the clinic at the providers discretion.     Again, thank you for choosing [facility].  Our hope is that these requests will decrease the amount of time that you wait before being seen by our physicians.       _____________________________________________________________  Should you have questions after your visit to [facility], please contact our office at [phone] between the hours of 8:30 a.m. and 4:30 p.m.  Voicemails left after 4:30 p.m. will not be returned until the following business day.  For prescription refill requests, have your pharmacy contact our office.         Resources For Cancer Patients and their Caregivers ? American Cancer Society: Can assist with transportation, wigs, general needs, runs Look Good Feel Better.         520 471 6490 ? Cancer Care: Provides financial assistance, online support groups, medication/co-pay assistance.  1-800-813-HOPE (936)871-8408) ? [facility] Assists [place] cancer patients and their families through emotional , educational and financial support.  814-217-6937 ? [place] DSS Where to apply for food stamps, Medicaid and utility assistance. 4254151193 ? RCATS: Transportation to medical appointments. 445 202 0722 ? Social Security Administration: May apply for disability if have a Stage IV cancer. 308-541-8096 661-161-1548 ? [employer], Disability and Transit Services: Assists with nutrition, care and transit needs. [facility] Support Programs: @10RELATIVEDAYS @ > Cancer Support Group  2nd Tuesday of the month 1pm-2pm, Journey Room  > Creative Journey  3rd Tuesday of the month 1130am-1pm, Journey Room  > Look Good Feel Better  1st Wednesday of the month 10am-12 noon, Journey Room (Call [facility] to register [phone])

## 2015-11-01 NOTE — Progress Notes (Signed)
[facility]  Progress Note  CHIEF COMPLAINTS:    Stage 1 infiltrating ductal carcinoma of right female breast ([place])   09/12/2014 Mammogram    Mass in upper outer R breast, middle third depth appears slightly larger than it was on prior exam with more irreg spiculated margins      10/02/2014 Pathology Results    biopsy with invasive ductal carcinoma high grade 1.1 cm, dcis solid type. additional R breast tissue, excision, invasive ductal high grade 0.9 cm       10/24/2014 Pathology Results    no residual invasive carcinoma, DCIS, focal, atypical ductal hyperplasia, 0/7 LN positive for metastatic carcinoma ER > 90%, PR 30%, HER 2 2+      10/24/2014 Cancer Staging    [id number]      10/24/2014 Surgery    R mastectomy, T1c, N0      12/28/2014 -  Chemotherapy    Taxol/herceptin weekly X 12, Herceptin every 21 days, last due in September 2017      03/11/2015 -  Anti-estrogen oral therapy    Arimidex 1 mg daily      09/12/2015 Imaging    MUGA- The left ventricular ejection fraction equals 68%.      HISTORY OF PRESENTING ILLNESS:  Carly Wood 80 y.o. female is here for ongoing care for a stage I ER+ PR+ HER 2 + carcinoma of the R breast. She underwent a mastectomy last August. She is currently on herceptin/arimidex. Patient's malignancy was in the upper outer quadrant of the R breast, she had undergone a biopsy previously of this area but it was benign. Repeat mammogram on 09/12/2014 showed growth in the spiculated mass and repeat biopsy was recommended.   Carly Wood is accompanied by her sister. She is here for ongoing Herceptin today. I personally reviewed and went over vital signs taken today with the patient. BP is elevated.  She has been taking her blood pressure medication regularly. She does not check her blood pressure at home. She follows with Dr. Brigitte Pulse for primary care. She believes her next appointment with her PCP is in September. She does not recall when her last  appointment was. She plans on having her Lorazepam refilled when she sees Dr. Brigitte Pulse.  She is eating and sleeping well.  She reports a belching problem after she drinks and eats. She tries not to drink fast. Her sister states she eats fast and does not chew food as long as she should. She takes Nexium.  She reports pain in her knees sometimes. This pain is not worse than normal.   She denies shortness of breath that is any worse. She denies diarrhea. She denies swelling that is new or worse. She denies any chest pain that is new or worse.    MEDICAL HISTORY:  Past Medical History:  Diagnosis Date  . Anemia   . Brain cancer ([place])    right   . Chronic kidney disease   . Diabetes mellitus without complication ([place])   . Stage 1 infiltrating ductal carcinoma of right female breast ([place]) 08/21/2015   ER+ PR+ HER 2 neu + (3+) T1cN0     SURGICAL HISTORY: History reviewed. No pertinent surgical history.  SOCIAL HISTORY: Social History   Social History  . Marital status: Single    Spouse name: N/A  . Number of children: N/A  . Years of education: N/A   Occupational History  . Not on file.   Social History Main Topics  .  Smoking status: Never Smoker  . Smokeless tobacco: Never Used  . Alcohol use No  . Drug use: No  . Sexual activity: Not on file   Other Topics Concern  . Not on file   Social History Narrative  . No narrative on file   Carly Wood, the patient, never married; says she never wanted to. She remarks that this was a choice she made; "not that I didn't have the opportunities, I just made the choice not to do it." NO tobacco, cigarettes or chewing tobacco. No ETOH  The 3 sisters live together. Deneise Lever does all the cooking.  She has nieces and nephews; one teaches school in [place]. Pilar Plate is her oldest brother; he was a principal. Has retired. Pilar Plate has a wife who was a Pharmacist, hospital; they have 3 children, including a son who graduated from [employer], and a daughter who graduated  from [employer] who is a Sport and exercise psychologist, and another daughter who has a PhD in education. Harle Battiest is her "favorite niece." Martin Majestic to college and is teaching something to do with drafting.  The patient, Carly Wood, was a good Ship broker; graduated high school and college. 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 Taught in [place], [place].  She notes she had hobbies; used to do artwork. Drawing pictures, mostly flowers, houses. Pen and pencil, colored pencils.  FAMILY HISTORY: History reviewed. No pertinent family history.  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  Father was 74 when he died; heart disease. Mother was 34 when she died. Diabetic, with congestive heart failure. Had a stroke as well. Sister Deneise Lever was caregiver for their mother for 10 years after her stroke. Her sister Wilford Sports has multiple myeloma and also had a CNS lymphoma, and notes that she is on Revlamid. Wilford Sports is 16 years out from her brain surgery.  7 brothers and sisters total; one is deceased. 6 siblings left. 1 that passed died of prostate cancer. "He lasted 12 years."  ALLERGIES:  is allergic to penicillins; ace inhibitors; and lisinopril.  MEDICATIONS:  Current Outpatient Prescriptions  Medication Sig Dispense Refill  . acetaminophen (TYLENOL) 500 MG tablet Take by mouth.    Marland Kitchen amLODipine (NORVASC) 5 MG tablet     . anastrozole (ARIMIDEX) 1 MG tablet Take by mouth.    Marland Kitchen anti-nausea (EMETROL) solution Take by mouth.    Marland Kitchen aspirin 81 MG chewable tablet Chew by mouth.    . calcium-vitamin D (OSCAL WITH D) 500-200 MG-UNIT tablet Take by mouth.    . cloNIDine (CATAPRES) 0.3 MG tablet Take by mouth.    . docusate sodium (COLACE)  100 MG capsule Take by mouth.    . esomeprazole (NEXIUM) 40 MG capsule Take by mouth.    Marland Kitchen glimepiride (AMARYL) 4 MG tablet Take by mouth.    . lidocaine-prilocaine (EMLA) cream Apply 1 application topically as needed. Apply to port site prior when needed for chemo, labs or port flushes. 30 g 2  . LORazepam (ATIVAN) 0.5 MG tablet Take by mouth.    . nebivolol (BYSTOLIC) 10 MG tablet Take by mouth.    Marland Kitchen PARoxetine (PAXIL) 20 MG tablet Take by mouth.    Marland Kitchen  pioglitazone (ACTOS) 15 MG tablet Take by mouth.    . pioglitazone (ACTOS) 30 MG tablet Take 15 mg by mouth 1 day or 1 dose.    . pravastatin (PRAVACHOL) 20 MG tablet Take by mouth. Reported on 08/30/2015    . pravastatin (PRAVACHOL) 20 MG tablet Reported on 08/30/2015    . traZODone (DESYREL) 50 MG tablet     . VITAMIN B COMPLEX-C PO Take by mouth.     No current facility-administered medications for this visit.     Review of Systems  Constitutional: Negative.  Negative for chills, fever, malaise/fatigue and weight loss.  HENT: Negative.  Negative for congestion, hearing loss, nosebleeds, sore throat and tinnitus.   Eyes: Negative.  Negative for blurred vision, double vision, pain and discharge.  Respiratory: Negative.  Negative for cough, hemoptysis, sputum production, shortness of breath and wheezing.   Cardiovascular: Negative.  Negative for chest pain, palpitations, claudication, leg swelling and PND.  Gastrointestinal: Positive for heartburn. Negative for abdominal pain, blood in stool, constipation, diarrhea, melena, nausea and vomiting.       Belching with food and drink intake.  Genitourinary: Negative.  Negative for dysuria, frequency, hematuria and urgency.  Musculoskeletal: Positive for joint pain. Negative for falls and myalgias.       Knee pain (unchanged)  Skin: Negative.  Negative for itching and rash.  Neurological: Negative.  Negative for dizziness, tingling, tremors, sensory change, speech change, focal weakness,  seizures, loss of consciousness, weakness and headaches.  Endo/Heme/Allergies: Negative.  Does not bruise/bleed easily.  Psychiatric/Behavioral: Negative.  Negative for depression, memory loss, substance abuse and suicidal ideas. The patient is not nervous/anxious and does not have insomnia.   All other systems reviewed and are negative.  14 point ROS was done and is otherwise as detailed above or in HPI   PHYSICAL EXAMINATION: ECOG PERFORMANCE STATUS: 1 - Symptomatic but completely ambulatory  Vitals:   11/01/15 1034  BP: (!) 181/73  Pulse: 72  Resp: 18  Temp: 98.1 F (36.7 C)   Filed Weights   11/01/15 1034  Weight: 193 lb 12.8 oz (87.9 kg)     Physical Exam  Constitutional: She is oriented to person, place, and time and well-developed, well-nourished, and in no distress.  Obese  HENT:  Head: Normocephalic and atraumatic.  Nose: Nose normal.  Mouth/Throat: Oropharynx is clear and moist. No oropharyngeal exudate.  Eyes: Conjunctivae and EOM are normal. Pupils are equal, round, and reactive to light. Right eye exhibits no discharge. Left eye exhibits no discharge. No scleral icterus.  Neck: Normal range of motion. Neck supple. No tracheal deviation present. No thyromegaly present.  Cardiovascular: Normal rate, regular rhythm and normal heart sounds.  Exam reveals no gallop and no friction rub.   No murmur heard. Pulmonary/Chest: Effort normal and breath sounds normal. She has no wheezes. She has no rales.  Abdominal: Soft. Bowel sounds are normal. She exhibits no distension and no mass. There is no tenderness. There is no rebound and no guarding.  Musculoskeletal: Normal range of motion. She exhibits no edema.  Lymphadenopathy:    She has no cervical adenopathy.  Neurological: She is alert and oriented to person, place, and time. No cranial nerve deficit.  Gait slow  Skin: Skin is warm and dry. No rash noted.  Psychiatric: Mood, memory, affect and judgment normal.    Nursing note and vitals reviewed.   LABORATORY DATA:  I have reviewed the data as listed Lab Results  Component Value Date  WBC 6.0 10/11/2015   HGB 9.7 (L) 10/11/2015   HCT 29.6 (L) 10/11/2015   MCV 95.2 10/11/2015   PLT 246 10/11/2015   CMP     Component Value Date/Time   NA 137 10/11/2015 1150   K 3.8 10/11/2015 1150   CL 108 10/11/2015 1150   CO2 26 10/11/2015 1150   GLUCOSE 243 (H) 10/11/2015 1150   BUN 32 (H) 10/11/2015 1150   CREATININE 2.38 (H) 10/11/2015 1150   CALCIUM 8.2 (L) 10/11/2015 1150   PROT 7.2 10/11/2015 1150   ALBUMIN 3.2 (L) 10/11/2015 1150   AST 18 10/11/2015 1150   ALT 13 (L) 10/11/2015 1150   ALKPHOS 84 10/11/2015 1150   BILITOT 0.3 10/11/2015 1150   GFRNONAA 19 (L) 10/11/2015 1150   GFRAA 22 (L) 10/11/2015 1150   RADIOGRAPHIC STUDIES: I have personally reviewed the radiological images as listed and agreed with the findings in the report.  Study Result   CLINICAL DATA:  Right breast cancer.  Chemotherapy.  Baseline.  EXAM: NUCLEAR MEDICINE CARDIAC BLOOD POOL IMAGING (MUGA)  TECHNIQUE: Cardiac multi-gated acquisition was performed at rest following intravenous injection of Tc-70mlabeled red blood cells.  RADIOPHARMACEUTICALS:  26.2 mCi Tc-938mDP in-vitro labeled red blood cells IV  COMPARISON:  None.  FINDINGS: The left ventricular ejection fraction was calculated equal 68%. Normal left ventricular wall motion.  IMPRESSION: The left ventricular ejection fraction equals 68%.   Electronically Signed   By: TaKerby Moors.D.   On: 09/12/2015 11:48     ASSESSMENT & PLAN:  Stage I ER+ PR+ HER 2 + carcinoma of R breast, upper outer quadrant R mastectomy Arimidex Herceptin Anemia CKD, Stage III DEXA with osteopenia 05/2015 Last 2-D echo 05/2015 Lymphedema  L breast screening mammogram due 09/2015 HTN  Her ejection fraction is 68% and she's tolerated the Herceptin very well. Will continue with ongoing herceptin  therapy.She is due to finish in September. She is to continue arimidex, calcium and vitamin D. She has osteopenia, we will discuss a bisphosphonate vs. prolia moving forward.  She is not open to PT to help with her gait/balance. Even today she wants me to push her in the wheelchair to the treatment area. We walked together.   Vitals reviewed. Blood pressure: 181/73. Her blood pressure will be checked again after our visit today. She follows with PCP, Dr. ShBrigitte PulsePatient believes her next appointment with him is in September.  Last mammogram performed on 10/09/2015.  I have scheduled the patient for a MUGA in October 2017.  RTC 6 weeks for follow up and discussion of MUGA results. At this next visit, we will discuss bone directed therapy and refer her to survivorship.  Orders Placed This Encounter  Procedures  . CBC with Differential    Standing Status:   Future    Standing Expiration Date:   10/31/2016  . Comprehensive metabolic panel    Standing Status:   Future    Standing Expiration Date:   10/31/2016    All questions were answered. The patient knows to call the clinic with any problems, questions or concerns.  This document serves as a record of services personally performed by ShAncil LinseyMD. It was created on her behalf by ElArlyce Harmana trained medical scribe. The creation of this record is based on the scribe's personal observations and the provider's statements to them. This document has been checked and approved by the attending provider.  I have reviewed the above documentation for accuracy  and completeness and I agree with the above.  This note was electronically signed.  Molli Hazard, MD  11/05/2015 9:14 AM

## 2015-11-01 NOTE — Progress Notes (Signed)
Carly Wood tolerated chemo tx well without complaints. B/P rechecked per MD order. MD made aware of last B/P and recommended pt to see her primary care physician which was relayed to pt's sister.Pt discharged via wheelchair in satisfactory condition with sister

## 2015-11-05 ENCOUNTER — Encounter (HOSPITAL_COMMUNITY): Payer: Self-pay | Admitting: Hematology & Oncology

## 2015-11-22 ENCOUNTER — Ambulatory Visit (HOSPITAL_COMMUNITY): Payer: Medicare Other

## 2015-11-22 ENCOUNTER — Encounter (HOSPITAL_COMMUNITY): Payer: Medicare Other | Attending: Hematology & Oncology

## 2015-11-22 VITALS — BP 154/81 | HR 63 | Temp 98.0°F | Resp 18 | Wt 192.2 lb

## 2015-11-22 DIAGNOSIS — M8580 Other specified disorders of bone density and structure, unspecified site: Secondary | ICD-10-CM | POA: Diagnosis not present

## 2015-11-22 DIAGNOSIS — Z170 Estrogen receptor positive status [ER+]: Secondary | ICD-10-CM | POA: Insufficient documentation

## 2015-11-22 DIAGNOSIS — C50411 Malignant neoplasm of upper-outer quadrant of right female breast: Secondary | ICD-10-CM | POA: Diagnosis not present

## 2015-11-22 DIAGNOSIS — E1122 Type 2 diabetes mellitus with diabetic chronic kidney disease: Secondary | ICD-10-CM | POA: Insufficient documentation

## 2015-11-22 DIAGNOSIS — Z79899 Other long term (current) drug therapy: Secondary | ICD-10-CM | POA: Insufficient documentation

## 2015-11-22 DIAGNOSIS — Z7982 Long term (current) use of aspirin: Secondary | ICD-10-CM | POA: Insufficient documentation

## 2015-11-22 DIAGNOSIS — Z23 Encounter for immunization: Secondary | ICD-10-CM | POA: Diagnosis not present

## 2015-11-22 DIAGNOSIS — N189 Chronic kidney disease, unspecified: Secondary | ICD-10-CM | POA: Insufficient documentation

## 2015-11-22 DIAGNOSIS — C50911 Malignant neoplasm of unspecified site of right female breast: Secondary | ICD-10-CM | POA: Insufficient documentation

## 2015-11-22 DIAGNOSIS — Z5112 Encounter for antineoplastic immunotherapy: Secondary | ICD-10-CM

## 2015-11-22 LAB — CBC WITH DIFFERENTIAL/PLATELET
BASOS ABS: 0 10*3/uL (ref 0.0–0.1)
BASOS PCT: 1 %
EOS ABS: 0.2 10*3/uL (ref 0.0–0.7)
Eosinophils Relative: 3 %
HCT: 30.6 % — ABNORMAL LOW (ref 36.0–46.0)
HEMOGLOBIN: 9.9 g/dL — AB (ref 12.0–15.0)
LYMPHS ABS: 1.1 10*3/uL (ref 0.7–4.0)
Lymphocytes Relative: 17 %
MCH: 30.5 pg (ref 26.0–34.0)
MCHC: 32.4 g/dL (ref 30.0–36.0)
MCV: 94.2 fL (ref 78.0–100.0)
Monocytes Absolute: 0.3 10*3/uL (ref 0.1–1.0)
Monocytes Relative: 5 %
NEUTROS PCT: 74 %
Neutro Abs: 4.8 10*3/uL (ref 1.7–7.7)
PLATELETS: 235 10*3/uL (ref 150–400)
RBC: 3.25 MIL/uL — AB (ref 3.87–5.11)
RDW: 15 % (ref 11.5–15.5)
WBC: 6.4 10*3/uL (ref 4.0–10.5)

## 2015-11-22 LAB — COMPREHENSIVE METABOLIC PANEL
ALBUMIN: 3.1 g/dL — AB (ref 3.5–5.0)
ALK PHOS: 88 U/L (ref 38–126)
ALT: 16 U/L (ref 14–54)
AST: 21 U/L (ref 15–41)
Anion gap: 9 (ref 5–15)
BUN: 29 mg/dL — AB (ref 6–20)
CALCIUM: 8.8 mg/dL — AB (ref 8.9–10.3)
CHLORIDE: 104 mmol/L (ref 101–111)
CO2: 25 mmol/L (ref 22–32)
CREATININE: 2.45 mg/dL — AB (ref 0.44–1.00)
GFR calc Af Amer: 21 mL/min — ABNORMAL LOW (ref >60)
GFR calc non Af Amer: 18 mL/min — ABNORMAL LOW (ref >60)
GLUCOSE: 257 mg/dL — AB (ref 65–99)
Potassium: 3.6 mmol/L (ref 3.5–5.1)
SODIUM: 138 mmol/L (ref 135–145)
Total Bilirubin: 0.4 mg/dL (ref 0.3–1.2)
Total Protein: 7.3 g/dL (ref 6.5–8.1)

## 2015-11-22 MED ORDER — ACETAMINOPHEN 325 MG PO TABS
650.0000 mg | ORAL_TABLET | Freq: Once | ORAL | Status: AC
  Administered 2015-11-22: 650 mg via ORAL

## 2015-11-22 MED ORDER — SODIUM CHLORIDE 0.9% FLUSH
10.0000 mL | INTRAVENOUS | Status: DC | PRN
Start: 2015-11-22 — End: 2015-11-22
  Administered 2015-11-22: 10 mL
  Filled 2015-11-22: qty 10

## 2015-11-22 MED ORDER — TRASTUZUMAB CHEMO 150 MG IV SOLR
6.0000 mg/kg | Freq: Once | INTRAVENOUS | Status: AC
  Administered 2015-11-22: 504 mg via INTRAVENOUS
  Filled 2015-11-22: qty 24

## 2015-11-22 MED ORDER — DIPHENHYDRAMINE HCL 25 MG PO CAPS
50.0000 mg | ORAL_CAPSULE | Freq: Once | ORAL | Status: AC
  Administered 2015-11-22: 50 mg via ORAL

## 2015-11-22 MED ORDER — HEPARIN SOD (PORK) LOCK FLUSH 100 UNIT/ML IV SOLN
500.0000 [IU] | Freq: Once | INTRAVENOUS | Status: AC | PRN
  Administered 2015-11-22: 500 [IU]
  Filled 2015-11-22: qty 5

## 2015-11-22 MED ORDER — DIPHENHYDRAMINE HCL 25 MG PO CAPS
ORAL_CAPSULE | ORAL | Status: AC
Start: 2015-11-22 — End: 2015-11-22
  Filled 2015-11-22: qty 2

## 2015-11-22 MED ORDER — INFLUENZA VAC SPLIT QUAD 0.5 ML IM SUSY
0.5000 mL | PREFILLED_SYRINGE | Freq: Once | INTRAMUSCULAR | Status: AC
  Administered 2015-11-22: 0.5 mL via INTRAMUSCULAR
  Filled 2015-11-22: qty 0.5

## 2015-11-22 MED ORDER — ACETAMINOPHEN 325 MG PO TABS
ORAL_TABLET | ORAL | Status: AC
  Filled 2015-11-22: qty 2

## 2015-11-22 MED ORDER — SODIUM CHLORIDE 0.9 % IV SOLN
Freq: Once | INTRAVENOUS | Status: AC
  Administered 2015-11-22: 10:00:00 via INTRAVENOUS

## 2015-11-22 NOTE — Progress Notes (Signed)
Chemotherapy given today per orders. Patient tolerated well. No problems. VSS. Patient discharged via wheelchair with family at side.   Gave patients sister a copy of her lab work to show her PCP, need to address her glucose numbers. They keep rising.

## 2015-11-22 NOTE — Patient Instructions (Signed)
[facility] Discharge Instructions for Patients Receiving Chemotherapy   Beginning January 23rd 2017 lab work for the Cancer Center will be done in the  Main lab at [facility] on 1st floor. If you have a lab appointment with the Cancer Center please come in thru the  Main Entrance and check in at the main information desk   Today you received the following chemotherapy agents Herceptin  To help prevent nausea and vomiting after your treatment, we encourage you to take your nausea medication   If you develop nausea and vomiting, or diarrhea that is not controlled by your medication, call the clinic.  The clinic phone number is [phone]. Office hours are Monday-Friday 8:30am-5:00pm.  BELOW ARE SYMPTOMS THAT SHOULD BE REPORTED IMMEDIATELY:  *FEVER GREATER THAN 101.0 F  *CHILLS WITH OR WITHOUT FEVER  NAUSEA AND VOMITING THAT IS NOT CONTROLLED WITH YOUR NAUSEA MEDICATION  *UNUSUAL SHORTNESS OF BREATH  *UNUSUAL BRUISING OR BLEEDING  TENDERNESS IN MOUTH AND THROAT WITH OR WITHOUT PRESENCE OF ULCERS  *URINARY PROBLEMS  *BOWEL PROBLEMS  UNUSUAL RASH Items with * indicate a potential emergency and should be followed up as soon as possible. If you have an emergency after office hours please contact your primary care physician or go to the nearest emergency department.  Please call the clinic during office hours if you have any questions or concerns.   You may also contact the Patient Navigator at [phone] should you have any questions or need assistance in obtaining follow up care.      Resources For Cancer Patients and their Caregivers ? American Cancer Society: Can assist with transportation, wigs, general needs, runs Look Good Feel Better.        1-888-227-6333 ? Cancer Care: Provides financial assistance, online support groups, medication/co-pay assistance.  1-800-813-HOPE (4673) ? [facility] Assists [place]  cancer patients and their families through emotional , educational and financial support.  336-427-4357 ? [place] DSS Where to apply for food stamps, Medicaid and utility assistance. 336-342-1394 ? [facility]: Transportation to medical appointments. 336-347-2287 ? Social Security Administration: May apply for disability if have a Stage IV cancer. 336-342-7796 1-800-772-1213 ? [place] Aging, Disability and Transit Services: Assists with nutrition, care and transit needs. 336-349-2343          

## 2015-12-13 ENCOUNTER — Other Ambulatory Visit (HOSPITAL_COMMUNITY): Payer: Medicare Other

## 2015-12-13 ENCOUNTER — Ambulatory Visit (HOSPITAL_COMMUNITY): Payer: Medicare Other | Admitting: Oncology

## 2015-12-20 ENCOUNTER — Encounter (HOSPITAL_COMMUNITY): Payer: Medicare Other

## 2015-12-20 ENCOUNTER — Encounter (HOSPITAL_COMMUNITY): Payer: Medicare Other | Attending: Oncology | Admitting: Oncology

## 2015-12-20 ENCOUNTER — Encounter (HOSPITAL_COMMUNITY): Payer: Self-pay | Admitting: Oncology

## 2015-12-20 VITALS — BP 154/71 | HR 62 | Temp 98.2°F | Resp 18 | Wt 192.5 lb

## 2015-12-20 DIAGNOSIS — M8580 Other specified disorders of bone density and structure, unspecified site: Secondary | ICD-10-CM | POA: Diagnosis not present

## 2015-12-20 DIAGNOSIS — Z79811 Long term (current) use of aromatase inhibitors: Secondary | ICD-10-CM

## 2015-12-20 DIAGNOSIS — Z170 Estrogen receptor positive status [ER+]: Secondary | ICD-10-CM | POA: Diagnosis not present

## 2015-12-20 DIAGNOSIS — C50911 Malignant neoplasm of unspecified site of right female breast: Secondary | ICD-10-CM | POA: Diagnosis not present

## 2015-12-20 LAB — CBC WITH DIFFERENTIAL/PLATELET
Basophils Absolute: 0 10*3/uL (ref 0.0–0.1)
Basophils Relative: 1 %
EOS ABS: 0.2 10*3/uL (ref 0.0–0.7)
EOS PCT: 3 %
HCT: 30.8 % — ABNORMAL LOW (ref 36.0–46.0)
HEMOGLOBIN: 9.9 g/dL — AB (ref 12.0–15.0)
LYMPHS ABS: 1.5 10*3/uL (ref 0.7–4.0)
LYMPHS PCT: 23 %
MCH: 30.5 pg (ref 26.0–34.0)
MCHC: 32.1 g/dL (ref 30.0–36.0)
MCV: 94.8 fL (ref 78.0–100.0)
MONOS PCT: 5 %
Monocytes Absolute: 0.3 10*3/uL (ref 0.1–1.0)
Neutro Abs: 4.5 10*3/uL (ref 1.7–7.7)
Neutrophils Relative %: 68 %
PLATELETS: 238 10*3/uL (ref 150–400)
RBC: 3.25 MIL/uL — AB (ref 3.87–5.11)
RDW: 15.2 % (ref 11.5–15.5)
WBC: 6.5 10*3/uL (ref 4.0–10.5)

## 2015-12-20 LAB — COMPREHENSIVE METABOLIC PANEL
ALK PHOS: 81 U/L (ref 38–126)
ALT: 15 U/L (ref 14–54)
ANION GAP: 5 (ref 5–15)
AST: 17 U/L (ref 15–41)
Albumin: 3.1 g/dL — ABNORMAL LOW (ref 3.5–5.0)
BUN: 34 mg/dL — ABNORMAL HIGH (ref 6–20)
CALCIUM: 8.7 mg/dL — AB (ref 8.9–10.3)
CO2: 26 mmol/L (ref 22–32)
CREATININE: 2.5 mg/dL — AB (ref 0.44–1.00)
Chloride: 107 mmol/L (ref 101–111)
GFR, EST AFRICAN AMERICAN: 20 mL/min — AB (ref >60)
GFR, EST NON AFRICAN AMERICAN: 17 mL/min — AB (ref >60)
Glucose, Bld: 174 mg/dL — ABNORMAL HIGH (ref 65–99)
Potassium: 4.3 mmol/L (ref 3.5–5.1)
Sodium: 138 mmol/L (ref 135–145)
Total Bilirubin: 0.2 mg/dL — ABNORMAL LOW (ref 0.3–1.2)
Total Protein: 7.3 g/dL (ref 6.5–8.1)

## 2015-12-20 NOTE — Progress Notes (Signed)
[facility], MD [place] [address]  Stage 1 infiltrating ductal carcinoma of right female breast [facility]) - Plan: CBC with Differential, Comprehensive metabolic panel, Vitamin D 25 hydroxy  CURRENT THERAPY: Arimidex beginning on 03/11/2015  INTERVAL HISTORY: Carly Wood 80 y.o. female returns for followup of Stage I (T1CN0M0) invasive ductal carcinoma of the right breast, ER/PR+, HER2+. S/P right mastectomy on 10/24/2014, followed by adjuvant chemotherapy consisting of weekly Taxol/Herceptin beginning on 12/28/2015.  Herceptin x 52 weeks completed on 11/22/2015.  Now on AI therapy beginning on 03/11/2015.     Stage 1 infiltrating ductal carcinoma of right female breast ([place])   09/12/2014 Mammogram    Mass in upper outer R breast, middle third depth appears slightly larger than it was on prior exam with more irreg spiculated margins      10/02/2014 Pathology Results    biopsy with invasive ductal carcinoma high grade 1.1 cm, dcis solid type. additional R breast tissue, excision, invasive ductal high grade 0.9 cm       10/24/2014 Pathology Results    no residual invasive carcinoma, DCIS, focal, atypical ductal hyperplasia, 0/7 LN positive for metastatic carcinoma ER > 90%, PR 30%, HER 2 2+      10/24/2014 Cancer Staging    T1cN0M0      10/24/2014 Surgery    R mastectomy, T1c, N0      12/28/2014 - 11/22/2015 Chemotherapy    Taxol/herceptin weekly X 12, Herceptin every 21 days, last due in September 2017      03/11/2015 -  Anti-estrogen oral therapy    Arimidex 1 mg daily      09/12/2015 Imaging    MUGA- The left ventricular ejection fraction equals 68%.      She is tolerating Arimidex well.  She denies any myalgias, arthralgias, and hot flashes.  She notes intermittent episodes of itching on arms and wrists.  She notes that it comes and goes.  It is not present now.  She reports that no rash exists now nor when she has itching.  Review of Systems    Constitutional: Negative for chills and fever.  HENT: Negative.   Eyes: Negative.   Respiratory: Negative.  Negative for cough.   Cardiovascular: Negative.  Negative for chest pain.  Gastrointestinal: Negative.  Negative for constipation, diarrhea, nausea and vomiting.  Genitourinary: Negative.   Musculoskeletal: Negative.   Skin: Positive for itching. Negative for rash.  Neurological: Negative.   Endo/Heme/Allergies: Negative.   Psychiatric/Behavioral: Negative.     Past Medical History:  Diagnosis Date  . Anemia   . Brain cancer ([place])    right   . Chronic kidney disease   . Chronic renal disease, stage 4, severely decreased glomerular filtration rate (GFR) between 15-29 mL/min/1.73 square meter (HCC) 08/22/2015  . Diabetes mellitus without complication ([place])   . Stage 1 infiltrating ductal carcinoma of right female breast ([place]) 08/21/2015   ER+ PR+ HER 2 neu + (3+) T1cN0     History reviewed. No pertinent surgical history.  History reviewed. No pertinent family history.  Social History   Social History  . Marital status: Single    Spouse name: N/A  . Number of children: N/A  . Years of education: N/A   Social History Main Topics  . Smoking status: Never Smoker  . Smokeless tobacco: Never Used  . Alcohol use No  . Drug use: No  . Sexual activity: Not Asked   Other Topics Concern  .  None   Social History Narrative  . None     PHYSICAL EXAMINATION  ECOG PERFORMANCE STATUS: 1 - Symptomatic but completely ambulatory  Vitals:   12/20/15 1445  BP: (!) 154/71  Pulse: 62  Resp: 18  Temp: 98.2 F (36.8 C)    GENERAL:alert, no distress, well nourished, well developed, comfortable, cooperative, obese, smiling and accompanied by two sisters SKIN: skin color, texture, turgor are normal, no rashes or significant lesions HEAD: Normocephalic, No masses, lesions, tenderness or abnormalities EYES: normal, EOMI, Conjunctiva are pink and non-injected EARS: External  ears normal OROPHARYNX:lips, buccal mucosa, and tongue normal and mucous membranes are moist  NECK: supple, trachea midline LYMPH:  no palpable lymphadenopathy BREAST:not examined LUNGS: clear to auscultation  HEART: regular rate & rhythm ABDOMEN:abdomen soft and normal bowel sounds BACK: Back symmetric, no curvature. EXTREMITIES:less then 2 second capillary refill, no joint deformities, effusion, or inflammation, no skin discoloration, no cyanosis  NEURO: alert & oriented x 3 with fluent speech, no focal motor/sensory deficits   LABORATORY DATA: CBC    Component Value Date/Time   WBC 6.5 12/20/2015 1359   RBC 3.25 (L) 12/20/2015 1359   HGB 9.9 (L) 12/20/2015 1359   HCT 30.8 (L) 12/20/2015 1359   PLT 238 12/20/2015 1359   MCV 94.8 12/20/2015 1359   MCH 30.5 12/20/2015 1359   MCHC 32.1 12/20/2015 1359   RDW 15.2 12/20/2015 1359   LYMPHSABS 1.5 12/20/2015 1359   MONOABS 0.3 12/20/2015 1359   EOSABS 0.2 12/20/2015 1359   BASOSABS 0.0 12/20/2015 1359      Chemistry      Component Value Date/Time   NA 138 12/20/2015 1359   K 4.3 12/20/2015 1359   CL 107 12/20/2015 1359   CO2 26 12/20/2015 1359   BUN 34 (H) 12/20/2015 1359   CREATININE 2.50 (H) 12/20/2015 1359      Component Value Date/Time   CALCIUM 8.7 (L) 12/20/2015 1359   ALKPHOS 81 12/20/2015 1359   AST 17 12/20/2015 1359   ALT 15 12/20/2015 1359   BILITOT 0.2 (L) 12/20/2015 1359        PENDING LABS:   RADIOGRAPHIC STUDIES:  No results found.   PATHOLOGY:    ASSESSMENT AND PLAN:  Stage 1 infiltrating ductal carcinoma of right female breast (HCC) Stage I (T1CN0M0) invasive ductal carcinoma of the right breast, ER/PR+, HER2+. S/P right mastectomy on 10/24/2014, followed by adjuvant chemotherapy consisting of weekly Taxol/Herceptin beginning on 12/28/2015.  Herceptin x 52 weeks completed on 11/22/2015.  Now on AI therapy beginning on 03/11/2015.  Oncology history is updated.  Labs today: CBC diff, CMET.   I personally reviewed and went over laboratory results with the patient.  The results are noted within this dictation.  Labs in 3 months: CBC diff, CMET, Vit D.  She is not sure when her last mammogram was.  In [facility], her last scanned mammogram in [place] was in July 2016.  If this was her last one, then she is overdue.  HOWEVER, she is currently scheduled for a mammogram in summer 2018.  As a result, I have requested the patient's most recent mammogram report from the [facility] in [place], [place] to confirm when her last mammogram was.  If she is overdue, then we will need to set her up for a mammogram ASAP.  The patient has osteopenia while being on an AI.  She is a candidate for Prolia intervention.  We will need to discuss in the near future.  Currently on Ca++ and Vit D.    Continue Arimidex daily.  Tolerating well.  Return in 3 months for follow-up and breast exam.    ORDERS PLACED FOR THIS ENCOUNTER: Orders Placed This Encounter  Procedures  . CBC with Differential  . Comprehensive metabolic panel  . Vitamin D 25 hydroxy    MEDICATIONS PRESCRIBED THIS ENCOUNTER: No orders of the defined types were placed in this encounter.   THERAPY PLAN:  NCCN guidelines recommends the following surveillance for invasive breast cancer (2.2017):  A. History and Physical exam 1-4 times per year as clinically appropriate for 5 years, then annually.  B. Periodic screening for changes in family history and referral to genetics counseling as indicated  C. Educate, monitor, and refer to lymphedema management.  D. Mammography every 12 months  E. Routine imaging of reconstructed breast is not indicated.  F. In the absence of clinical signs and symptoms suggestive of recurrent disease, there is no indication for laboratory or imaging studies for metastases screening.  G. Women on Tamoxifen: annual gynecologic assessment every 12 months if uterus is present.  H. Women on aromatase inhibitor or who experience  ovarian failure secondary to treatment should have monitoring of bone health with a bone mineral density determination at baseline and periodically thereafter.  I. Assess and encourage adherence to adjuvant endocrine therapy.  J. Evidence suggests that active lifestyle, healthy diet, limited alcohol intake, and achieving and maintaining an ideal body weight (20-25 BMI) may lead to optimal breast cancer outcomes.   All questions were answered. The patient knows to call the clinic with any problems, questions or concerns. We can certainly see the patient much sooner if necessary.  Patient and plan discussed with Dr. Ancil Linsey and she is in agreement with the aforementioned.   This note is electronically signed by: Doy Mince 12/20/2015 6:15 PM

## 2015-12-20 NOTE — Patient Instructions (Addendum)
[place] at [facility] Discharge Instructions  RECOMMENDATIONS MADE BY THE CONSULTANT AND ANY TEST RESULTS WILL BE SENT TO YOUR REFERRING PHYSICIAN.  You saw Carly Crigler, PA-C, today. Continue Arimidex. Follow up with Dr. Whitney Muse and labs in 3 months.  Thank you for choosing [place] at [facility] to provide your oncology and hematology care.  To afford each patient quality time with our provider, please arrive at least 15 minutes before your scheduled appointment time.   Beginning January 23rd 2017 lab work for the [facility] will be done in the  Main lab at [facility] on 1st floor. If you have a lab appointment with the [facility] please come in thru the  Main Entrance and check in at the main information desk  You need to re-schedule your appointment should you arrive 10 or more minutes late.  We strive to give you quality time with our providers, and arriving late affects you and other patients whose appointments are after yours.  Also, if you no show three or more times for appointments you may be dismissed from the clinic at the providers discretion.     Again, thank you for choosing [facility].  Our hope is that these requests will decrease the amount of time that you wait before being seen by our physicians.       _____________________________________________________________  Should you have questions after your visit to [facility], please contact our office at [phone] between the hours of 8:30 a.m. and 4:30 p.m.  Voicemails left after 4:30 p.m. will not be returned until the following business day.  For prescription refill requests, have your pharmacy contact our office.         Resources For Cancer Patients and their Caregivers ? American Cancer Society: Can assist with transportation, wigs, general needs, runs Look Good Feel Better.        657-366-8691 ? Cancer Care: Provides  financial assistance, online support groups, medication/co-pay assistance.  1-800-813-HOPE (786) 553-9949) ? [place] Assists [place] cancer patients and their families through emotional , educational and financial support.  615-507-0283 ? [place] DSS Where to apply for food stamps, Medicaid and utility assistance. 302-633-5236 ? RCATS: Transportation to medical appointments. 563-617-4890 ? Social Security Administration: May apply for disability if have a Stage IV cancer. 912-822-2135 8285544138 ? [employer], Disability and Transit Services: Assists with nutrition, care and transit needs. [place] Support Programs: @10RELATIVEDAYS @ > Cancer Support Group  2nd Tuesday of the month 1pm-2pm, Journey Room  > Creative Journey  3rd Tuesday of the month 1130am-1pm, Journey Room  > Look Good Feel Better  1st Wednesday of the month 10am-12 noon, Journey Room (Call [place] to register [phone])

## 2015-12-20 NOTE — Assessment & Plan Note (Addendum)
Stage I (T1CN0M0) invasive ductal carcinoma of the right breast, ER/PR+, HER2+. S/P right mastectomy on 10/24/2014, followed by adjuvant chemotherapy consisting of weekly Taxol/Herceptin beginning on 12/28/2015.  Herceptin x 52 weeks completed on 11/22/2015.  Now on AI therapy beginning on 03/11/2015.  Oncology history is updated.  Labs today: CBC diff, CMET.  I personally reviewed and went over laboratory results with the patient.  The results are noted within this dictation.  Labs in 3 months: CBC diff, CMET, Vit D.  She is not sure when her last mammogram was.  In [facility], her last scanned mammogram in [place] was in July 2016.  If this was her last one, then she is overdue.  HOWEVER, she is currently scheduled for a mammogram in summer 2018.  As a result, I have requested the patient's most recent mammogram report from the [facility] in [place], [place] to confirm when her last mammogram was.  If she is overdue, then we will need to set her up for a mammogram ASAP.  The patient has osteopenia while being on an AI.  She is a candidate for Prolia intervention.  We will need to discuss in the near future.  Currently on Ca++ and Vit D.    Continue Arimidex daily.  Tolerating well.  Return in 3 months for follow-up and breast exam.

## 2015-12-27 ENCOUNTER — Telehealth (HOSPITAL_COMMUNITY): Payer: Self-pay | Admitting: Oncology

## 2015-12-27 NOTE — Telephone Encounter (Signed)
Faxed ref to dr Lowanda Foster

## 2016-01-16 ENCOUNTER — Encounter (HOSPITAL_COMMUNITY): Payer: Self-pay

## 2016-01-16 ENCOUNTER — Encounter (HOSPITAL_COMMUNITY): Payer: Medicare Other | Attending: Hematology & Oncology

## 2016-01-16 DIAGNOSIS — Z452 Encounter for adjustment and management of vascular access device: Secondary | ICD-10-CM | POA: Diagnosis not present

## 2016-01-16 DIAGNOSIS — C50911 Malignant neoplasm of unspecified site of right female breast: Secondary | ICD-10-CM | POA: Diagnosis not present

## 2016-01-16 MED ORDER — HEPARIN SOD (PORK) LOCK FLUSH 100 UNIT/ML IV SOLN
500.0000 [IU] | Freq: Once | INTRAVENOUS | Status: AC
  Administered 2016-01-16: 500 [IU] via INTRAVENOUS
  Filled 2016-01-16: qty 5

## 2016-01-16 MED ORDER — SODIUM CHLORIDE 0.9% FLUSH
10.0000 mL | INTRAVENOUS | Status: DC | PRN
  Administered 2016-01-16: 10 mL via INTRAVENOUS
  Filled 2016-01-16: qty 10

## 2016-01-16 NOTE — Patient Instructions (Signed)
[place] at [facility] Discharge Instructions  RECOMMENDATIONS MADE BY THE CONSULTANT AND ANY TEST RESULTS WILL BE SENT TO YOUR REFERRING PHYSICIAN.  Port flush done Follow up as scheduled.  Thank you for choosing [place] at [facility] to provide your oncology and hematology care.  To afford each patient quality time with our provider, please arrive at least 15 minutes before your scheduled appointment time.   Beginning January 23rd 2017 lab work for the [facility] will be done in the  Main lab at [facility] on 1st floor. If you have a lab appointment with the [facility] please come in thru the  Main Entrance and check in at the main information desk  You need to re-schedule your appointment should you arrive 10 or more minutes late.  We strive to give you quality time with our providers, and arriving late affects you and other patients whose appointments are after yours.  Also, if you no show three or more times for appointments you may be dismissed from the clinic at the providers discretion.     Again, thank you for choosing [facility].  Our hope is that these requests will decrease the amount of time that you wait before being seen by our physicians.       _____________________________________________________________  Should you have questions after your visit to [facility], please contact our office at [phone] between the hours of 8:30 a.m. and 4:30 p.m.  Voicemails left after 4:30 p.m. will not be returned until the following business day.  For prescription refill requests, have your pharmacy contact our office.         Resources For Cancer Patients and their Caregivers ? American Cancer Society: Can assist with transportation, wigs, general needs, runs Look Good Feel Better.        ([phone] ? Cancer Care: Provides financial assistance, online support groups, medication/co-pay  assistance.  1-800-813-HOPE 337-880-6845) ? [place] Assists [place] cancer patients and their families through emotional , educational and financial support.  6841001639 ? [place] DSS Where to apply for food stamps, Medicaid and utility assistance. [phone] ? RCATS: Transportation to medical appointments. 719-089-3247 ? Social Security Administration: May apply for disability if have a Stage IV cancer. 8058004726 519-068-9473 ? [employer], Disability and Transit Services: Assists with nutrition, care and transit needs. [place] Support Programs: @10RELATIVEDAYS @ > Cancer Support Group  2nd Tuesday of the month 1pm-2pm, Journey Room  > Creative Journey  3rd Tuesday of the month 1130am-1pm, Journey Room  > Look Good Feel Better  1st Wednesday of the month 10am-12 noon, Journey Room (Call [place] to register [phone])

## 2016-01-16 NOTE — Progress Notes (Signed)
Carly Wood presented for Portacath access and flush. Portacath located left chest wall accessed with  H 20 needle. Good blood return present. Portacath flushed with 96ml NS and 500U/78ml Heparin and needle removed intact. Procedure without incident. Patient tolerated procedure well.  Vitals stable, discharged from clinic. Follow up as scheduled.

## 2016-02-24 ENCOUNTER — Other Ambulatory Visit (HOSPITAL_COMMUNITY): Payer: Self-pay | Admitting: Nephrology

## 2016-02-24 DIAGNOSIS — N183 Chronic kidney disease, stage 3 unspecified: Secondary | ICD-10-CM

## 2016-03-05 ENCOUNTER — Ambulatory Visit (HOSPITAL_COMMUNITY)
Admission: RE | Admit: 2016-03-05 | Discharge: 2016-03-05 | Disposition: A | Discharge status: Home / Self Care | Payer: Medicare Other | Source: Ambulatory Visit | Attending: Nephrology | Admitting: Nephrology

## 2016-03-05 DIAGNOSIS — N329 Bladder disorder, unspecified: Secondary | ICD-10-CM | POA: Insufficient documentation

## 2016-03-05 DIAGNOSIS — N183 Chronic kidney disease, stage 3 unspecified: Secondary | ICD-10-CM

## 2016-03-05 DIAGNOSIS — N281 Cyst of kidney, acquired: Secondary | ICD-10-CM | POA: Diagnosis not present

## 2016-03-05 DIAGNOSIS — R93421 Abnormal radiologic findings on diagnostic imaging of right kidney: Secondary | ICD-10-CM | POA: Insufficient documentation

## 2016-03-05 DIAGNOSIS — N200 Calculus of kidney: Secondary | ICD-10-CM | POA: Insufficient documentation

## 2016-03-12 ENCOUNTER — Encounter (HOSPITAL_COMMUNITY): Payer: Medicare Other

## 2016-03-18 ENCOUNTER — Encounter (HOSPITAL_COMMUNITY): Payer: Medicare Other | Attending: Oncology

## 2016-03-18 ENCOUNTER — Encounter (HOSPITAL_COMMUNITY): Payer: Self-pay

## 2016-03-18 DIAGNOSIS — C50911 Malignant neoplasm of unspecified site of right female breast: Secondary | ICD-10-CM | POA: Insufficient documentation

## 2016-03-18 DIAGNOSIS — Z452 Encounter for adjustment and management of vascular access device: Secondary | ICD-10-CM | POA: Diagnosis not present

## 2016-03-18 MED ORDER — SODIUM CHLORIDE 0.9% FLUSH
10.0000 mL | INTRAVENOUS | Status: DC | PRN
  Administered 2016-03-18: 10 mL via INTRAVENOUS
  Filled 2016-03-18: qty 10

## 2016-03-18 MED ORDER — HEPARIN SOD (PORK) LOCK FLUSH 100 UNIT/ML IV SOLN
INTRAVENOUS | Status: AC
  Filled 2016-03-18: qty 5

## 2016-03-18 MED ORDER — HEPARIN SOD (PORK) LOCK FLUSH 100 UNIT/ML IV SOLN
500.0000 [IU] | Freq: Once | INTRAVENOUS | Status: AC
  Administered 2016-03-18: 500 [IU] via INTRAVENOUS

## 2016-03-18 NOTE — Patient Instructions (Signed)
[facility] at [facility] Discharge Instructions  RECOMMENDATIONS MADE BY THE CONSULTANT AND ANY TEST RESULTS WILL BE SENT TO YOUR REFERRING PHYSICIAN.  Port flush done  Follow up as scheduled.  Thank you for choosing [facility] at [facility] to provide your oncology and hematology care.  To afford each patient quality time with our provider, please arrive at least 15 minutes before your scheduled appointment time.    If you have a lab appointment with the Cancer Center please come in thru the  Main Entrance and check in at the main information desk  You need to re-schedule your appointment should you arrive 10 or more minutes late.  We strive to give you quality time with our providers, and arriving late affects you and other patients whose appointments are after yours.  Also, if you no show three or more times for appointments you may be dismissed from the clinic at the providers discretion.     Again, thank you for choosing [facility].  Our hope is that these requests will decrease the amount of time that you wait before being seen by our physicians.       _____________________________________________________________  Should you have questions after your visit to [facility], please contact our office at [phone] between the hours of 8:30 a.m. and 4:30 p.m.  Voicemails left after 4:30 p.m. will not be returned until the following business day.  For prescription refill requests, have your pharmacy contact our office.       Resources For Cancer Patients and their Caregivers ? American Cancer Society: Can assist with transportation, wigs, general needs, runs Look Good Feel Better.        1-888-227-6333 ? Cancer Care: Provides financial assistance, online support groups, medication/co-pay assistance.  1-800-813-HOPE (4673) ? [facility] Assists [place] cancer patients and their  families through emotional , educational and financial support.  336-427-4357 ? [place] DSS Where to apply for food stamps, Medicaid and utility assistance. 336-342-1394 ? RCATS: Transportation to medical appointments. 336-347-2287 ? Social Security Administration: May apply for disability if have a Stage IV cancer. 336-342-7796 1-800-772-1213 ? [place] Aging, Disability and Transit Services: Assists with nutrition, care and transit needs. 336-349-2343  Cancer Center Support Programs: @10RELATIVEDAYS@ > Cancer Support Group  2nd Tuesday of the month 1pm-2pm, Journey Room  > Creative Journey  3rd Tuesday of the month 1130am-1pm, Journey Room  > Look Good Feel Better  1st Wednesday of the month 10am-12 noon, Journey Room (Call American [employer] to register [phone])   

## 2016-03-18 NOTE — Progress Notes (Signed)
Carly Wood presented for Portacath access and flush. Portacath located left chest wall accessed with  H 20 needle. Good blood return present. Portacath flushed with 31ml NS and 500U/77ml Heparin and needle removed intact. Procedure without incident. Patient tolerated procedure well.  Vitals stable and discharged from clinic via wheelchair with family. Follow up as scheduled.

## 2016-04-03 ENCOUNTER — Encounter (HOSPITAL_COMMUNITY): Payer: Medicare Other

## 2016-04-03 ENCOUNTER — Encounter (HOSPITAL_BASED_OUTPATIENT_CLINIC_OR_DEPARTMENT_OTHER): Payer: Medicare Other | Admitting: Hematology & Oncology

## 2016-04-03 ENCOUNTER — Encounter (HOSPITAL_COMMUNITY): Payer: Self-pay | Admitting: Hematology & Oncology

## 2016-04-03 VITALS — BP 141/63 | HR 56 | Temp 98.6°F | Resp 16 | Wt 189.6 lb

## 2016-04-03 DIAGNOSIS — D649 Anemia, unspecified: Secondary | ICD-10-CM

## 2016-04-03 DIAGNOSIS — Z170 Estrogen receptor positive status [ER+]: Secondary | ICD-10-CM | POA: Diagnosis not present

## 2016-04-03 DIAGNOSIS — C50911 Malignant neoplasm of unspecified site of right female breast: Secondary | ICD-10-CM

## 2016-04-03 DIAGNOSIS — Z79811 Long term (current) use of aromatase inhibitors: Secondary | ICD-10-CM | POA: Diagnosis not present

## 2016-04-03 DIAGNOSIS — E119 Type 2 diabetes mellitus without complications: Secondary | ICD-10-CM | POA: Diagnosis not present

## 2016-04-03 DIAGNOSIS — N183 Chronic kidney disease, stage 3 unspecified: Secondary | ICD-10-CM

## 2016-04-03 DIAGNOSIS — I10 Essential (primary) hypertension: Secondary | ICD-10-CM

## 2016-04-03 DIAGNOSIS — D631 Anemia in chronic kidney disease: Secondary | ICD-10-CM

## 2016-04-03 DIAGNOSIS — M8580 Other specified disorders of bone density and structure, unspecified site: Secondary | ICD-10-CM

## 2016-04-03 DIAGNOSIS — Z9011 Acquired absence of right breast and nipple: Secondary | ICD-10-CM

## 2016-04-03 LAB — CBC WITH DIFFERENTIAL/PLATELET
BASOS ABS: 0 10*3/uL (ref 0.0–0.1)
Basophils Relative: 1 %
EOS PCT: 2 %
Eosinophils Absolute: 0.1 10*3/uL (ref 0.0–0.7)
HEMATOCRIT: 30.5 % — AB (ref 36.0–46.0)
Hemoglobin: 10.1 g/dL — ABNORMAL LOW (ref 12.0–15.0)
LYMPHS ABS: 1.2 10*3/uL (ref 0.7–4.0)
LYMPHS PCT: 19 %
MCH: 31.2 pg (ref 26.0–34.0)
MCHC: 33.1 g/dL (ref 30.0–36.0)
MCV: 94.1 fL (ref 78.0–100.0)
MONO ABS: 0.3 10*3/uL (ref 0.1–1.0)
MONOS PCT: 5 %
NEUTROS ABS: 4.7 10*3/uL (ref 1.7–7.7)
Neutrophils Relative %: 73 %
Platelets: 270 10*3/uL (ref 150–400)
RBC: 3.24 MIL/uL — ABNORMAL LOW (ref 3.87–5.11)
RDW: 15.2 % (ref 11.5–15.5)
WBC: 6.3 10*3/uL (ref 4.0–10.5)

## 2016-04-03 LAB — COMPREHENSIVE METABOLIC PANEL
ALT: 12 U/L — ABNORMAL LOW (ref 14–54)
AST: 16 U/L (ref 15–41)
Albumin: 3 g/dL — ABNORMAL LOW (ref 3.5–5.0)
Alkaline Phosphatase: 80 U/L (ref 38–126)
Anion gap: 8 (ref 5–15)
BILIRUBIN TOTAL: 0.6 mg/dL (ref 0.3–1.2)
BUN: 28 mg/dL — AB (ref 6–20)
CO2: 25 mmol/L (ref 22–32)
Calcium: 8.8 mg/dL — ABNORMAL LOW (ref 8.9–10.3)
Chloride: 105 mmol/L (ref 101–111)
Creatinine, Ser: 2.5 mg/dL — ABNORMAL HIGH (ref 0.44–1.00)
GFR, EST AFRICAN AMERICAN: 20 mL/min — AB (ref >60)
GFR, EST NON AFRICAN AMERICAN: 17 mL/min — AB (ref >60)
Glucose, Bld: 225 mg/dL — ABNORMAL HIGH (ref 65–99)
POTASSIUM: 4.2 mmol/L (ref 3.5–5.1)
Sodium: 138 mmol/L (ref 135–145)
TOTAL PROTEIN: 7.3 g/dL (ref 6.5–8.1)

## 2016-04-03 NOTE — Patient Instructions (Signed)
[place] at [facility] Discharge Instructions  RECOMMENDATIONS MADE BY THE CONSULTANT AND ANY TEST RESULTS WILL BE SENT TO YOUR REFERRING PHYSICIAN.  You were seen today by Dr. Whitney Muse Follow up in 2 months  Information is given today about Prolia and Neratinib  Thank you for choosing [place] at [facility] to provide your oncology and hematology care.  To afford each patient quality time with our provider, please arrive at least 15 minutes before your scheduled appointment time.    If you have a lab appointment with the [place] please come in thru the  Main Entrance and check in at the main information desk  You need to re-schedule your appointment should you arrive 10 or more minutes late.  We strive to give you quality time with our providers, and arriving late affects you and other patients whose appointments are after yours.  Also, if you no show three or more times for appointments you may be dismissed from the clinic at the providers discretion.     Again, thank you for choosing [facility].  Our hope is that these requests will decrease the amount of time that you wait before being seen by our physicians.       _____________________________________________________________  Should you have questions after your visit to [facility], please contact our office at [phone] between the hours of 8:30 a.m. and 4:30 p.m.  Voicemails left after 4:30 p.m. will not be returned until the following business day.  For prescription refill requests, have your pharmacy contact our office.       Resources For Cancer Patients and their Caregivers ? American Cancer Society: Can assist with transportation, wigs, general needs, runs Look Good Feel Better.        402-486-7429 ? Cancer Care: Provides financial assistance, online support groups, medication/co-pay assistance.  1-800-813-HOPE 5038495738) ? [place] Assists [place] cancer patients and their families through emotional , educational and financial support.  901-662-8544 ? [place] DSS Where to apply for food stamps, Medicaid and utility assistance. (865)594-1761 ? RCATS: Transportation to medical appointments. 9047486317 ? Social Security Administration: May apply for disability if have a Stage IV cancer. (681)336-5838 [phone] ? [employer], Disability and Transit Services: Assists with nutrition, care and transit needs. [place] Support Programs: @10RELATIVEDAYS @ > Cancer Support Group  2nd Tuesday of the month 1pm-2pm, Journey Room  > Creative Journey  3rd Tuesday of the month 1130am-1pm, Journey Room  > Look Good Feel Better  1st Wednesday of the month 10am-12 noon, Journey Room (Call [place] to register [phone])

## 2016-04-03 NOTE — Progress Notes (Signed)
[facility]  Progress Note  CHIEF COMPLAINTS:    Stage 1 infiltrating ductal carcinoma of right female breast ([facility])   09/12/2014 Mammogram    Mass in upper outer R breast, middle third depth appears slightly larger than it was on prior exam with more irreg spiculated margins      10/02/2014 Pathology Results    biopsy with invasive ductal carcinoma high grade 1.1 cm, dcis solid type. additional R breast tissue, excision, invasive ductal high grade 0.9 cm       10/24/2014 Pathology Results    no residual invasive carcinoma, DCIS, focal, atypical ductal hyperplasia, 0/7 LN positive for metastatic carcinoma ER > 90%, PR 30%, HER 2 2+      10/24/2014 Cancer Staging    [id number]      10/24/2014 Surgery    R mastectomy, T1c, N0      12/28/2014 - 11/22/2015 Chemotherapy    Taxol/herceptin weekly X 12, Herceptin every 21 days, last due in September 2017      03/11/2015 -  Anti-estrogen oral therapy    Arimidex 1 mg daily      09/12/2015 Imaging    MUGA- The left ventricular ejection fraction equals 68%.      HISTORY OF PRESENTING ILLNESS:  Carly Wood 81 y.o. female is here for ongoing care for a stage I ER+ PR+ HER 2 + carcinoma of the R breast. She underwent a mastectomy last August. She is currently on herceptin/arimidex. Patient's malignancy was in the upper outer quadrant of the R breast, she had undergone a biopsy previously of this area but it was benign. Repeat mammogram on 09/12/2014 showed growth in the spiculated mass and repeat biopsy was recommended.   Ms. Reznick is accompanied by two female family members. She presents in wheelchair.  She admits her diabetes has not been well managed lately. She does not really watch her diet.  She denies falling lately. She reports her foot swelling has improved.   She denies bowel issues, and is having bowel movements twice daily. She eats prunes for breakfast. She will be due for L mammogram in June. She notes no  difficulties with her arimidex.  MEDICAL HISTORY:  Past Medical History:  Diagnosis Date  . Anemia   . Brain cancer ([facility])    right   . Chronic kidney disease   . Chronic renal disease, stage 4, severely decreased glomerular filtration rate (GFR) between 15-29 mL/min/1.73 square meter ([facility]) 08/22/2015  . Diabetes mellitus without complication ([facility])   . Stage 1 infiltrating ductal carcinoma of right female breast ([facility]) 08/21/2015   ER+ PR+ HER 2 neu + (3+) T1cN0     SURGICAL HISTORY: History reviewed. No pertinent surgical history.  SOCIAL HISTORY: Social History   Social History  . Marital status: Single    Spouse name: N/A  . Number of children: N/A  . Years of education: N/A   Occupational History  . Not on file.   Social History Main Topics  . Smoking status: Never Smoker  . Smokeless tobacco: Never Used  . Alcohol use No  . Drug use: No  . Sexual activity: Not on file   Other Topics Concern  . Not on file   Social History Narrative  . No narrative on file   Carly Wood, the patient, never married; says she never wanted to. She remarks that this was a choice she made; "not that I didn't have the opportunities, I just made the choice not  to do it." NO tobacco, cigarettes or chewing tobacco. No ETOH  The 3 sisters live together. Carly Wood does all the cooking.  She has nieces and nephews; one teaches school in [place]. Carly Wood is her oldest brother; he was a principal. Has retired. Carly Wood has a wife who was a Pharmacist, hospital; they have 3 children, including a son who graduated from [employer], and a daughter who graduated from [employer] who is a Sport and exercise psychologist, and another daughter who has a PhD in education. 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  The patient, Carly Wood, was a good Ship broker; graduated high school and college. 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 Taught in [place], [place].  She notes  she had hobbies; used to do artwork. Drawing pictures, mostly flowers, houses. Pen and pencil, colored pencils.  FAMILY HISTORY: History reviewed. No pertinent family history.  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  Father was 77 when he died; heart disease. Mother was 67 when she died. Diabetic, with congestive heart failure. Had a stroke as well. Sister Carly Wood was caregiver for their mother for 10 years after her stroke. Her sister Carly Wood has multiple myeloma and also had a CNS lymphoma, and notes that she is on Revlamid. Carly Wood is 16 years out from her brain surgery.  7 brothers and sisters total; one is deceased. 6 siblings left. 1 that passed died of prostate cancer. "He lasted 12 years."  ALLERGIES:  is allergic to penicillins; ace inhibitors; and lisinopril.  MEDICATIONS:  Current Outpatient Prescriptions  Medication Sig Dispense Refill  . acetaminophen (TYLENOL) 500 MG tablet Take by mouth.    Marland Kitchen amLODipine (NORVASC) 5 MG tablet     . anti-nausea (EMETROL) solution Take by mouth.    Marland Kitchen aspirin 81 MG chewable tablet Chew by mouth.    . calcium-vitamin D (OSCAL WITH D) 500-200 MG-UNIT tablet Take by mouth.    . cloNIDine (CATAPRES) 0.3 MG tablet Take by mouth.    . docusate sodium (COLACE) 100 MG capsule Take by mouth.    . esomeprazole (NEXIUM) 40 MG capsule Take by mouth.    Marland Kitchen glimepiride (AMARYL) 4 MG tablet Take by mouth.    . lidocaine-prilocaine (EMLA) cream Apply 1 application topically as needed. Apply to port site prior when needed for chemo, labs or port flushes. 30 g 2  . LORazepam (ATIVAN) 0.5 MG tablet Take by mouth.    . nebivolol (BYSTOLIC) 10 MG tablet Take by mouth.    Marland Kitchen PARoxetine (PAXIL)  20 MG tablet Take by mouth.    . pioglitazone (ACTOS) 15 MG tablet Take by mouth.    . pioglitazone (ACTOS) 30 MG tablet Take 15 mg by mouth 1 day or 1 dose.    . pravastatin (PRAVACHOL) 20 MG tablet Take by mouth. Reported on 08/30/2015    . pravastatin (PRAVACHOL) 20 MG tablet Reported on 08/30/2015    . traZODone (DESYREL) 50 MG tablet     . VITAMIN B COMPLEX-C PO Take by mouth.     No current facility-administered medications for this visit.     Review of Systems  Constitutional: Negative.  Negative for chills, fever, malaise/fatigue and weight loss.  HENT: Negative.  Negative for congestion, hearing loss, nosebleeds, sore throat and tinnitus.   Eyes: Negative.  Negative for blurred vision, double vision, pain and discharge.  Respiratory: Negative.  Negative for cough, hemoptysis, sputum production, shortness of breath and wheezing.   Cardiovascular: Negative.  Negative for chest pain, palpitations, claudication, leg swelling and PND.  Gastrointestinal: Negative for abdominal pain, blood in stool, constipation, diarrhea, melena, nausea and vomiting.  Genitourinary: Negative.  Negative for dysuria, frequency, hematuria and urgency.  Musculoskeletal: Positive for joint pain. Negative for falls and myalgias.       Knee pain (unchanged)  Skin: Negative.  Negative for itching and rash.  Neurological: Negative.  Negative for dizziness, tingling, tremors, sensory change, speech change, focal weakness, seizures, loss of consciousness, weakness and headaches.  Endo/Heme/Allergies: Negative.  Does not bruise/bleed easily.  Psychiatric/Behavioral: Negative.  Negative for depression, memory loss, substance abuse and suicidal ideas. The patient is not nervous/anxious and does not have insomnia.   All other systems reviewed and are negative.  14 point ROS was done and is otherwise as detailed above or in HPI   PHYSICAL EXAMINATION: ECOG PERFORMANCE STATUS: 1 - Symptomatic but completely  ambulatory  Vitals:   04/03/16 1345  BP: (!) 141/63  Pulse: (!) 56  Resp: 16  Temp: 98.6 F (37 C)   Filed Weights   04/03/16 1345  Weight: 189 lb 9.6 oz (86 kg)     Physical Exam  Constitutional: She is oriented to person, place, and time and well-developed, well-nourished, and in no distress.  Obese, in wheelchair. Able to get on exam table with assistance.  HENT:  Head: Normocephalic and atraumatic.  Nose: Nose normal.  Mouth/Throat: Oropharynx is clear and moist. No oropharyngeal exudate.  Eyes: Conjunctivae and EOM are normal. Pupils are equal, round, and reactive to light. Right eye exhibits no discharge. Left eye exhibits no discharge. No scleral icterus.  Neck: Normal range of motion. Neck supple. No tracheal deviation present. No thyromegaly present.  Cardiovascular: Normal rate, regular rhythm and normal heart sounds.  Exam reveals no gallop and no friction rub.   No murmur heard. Pulmonary/Chest: Effort normal and breath sounds normal. She has no wheezes. She has no rales.  Abdominal: Soft. Bowel sounds are normal. She exhibits no distension and no mass. There is no tenderness. There is no rebound and no guarding.  Musculoskeletal: Normal range of motion. She exhibits edema (Bilateral lower extremity, improved).  Lymphadenopathy:    She has no cervical adenopathy.  Neurological: She is alert and oriented to person, place, and time. No cranial nerve deficit.  Gait slow  Skin: Skin is warm and dry. No rash noted.  Psychiatric: Mood, memory, affect and judgment normal.  Nursing note and vitals reviewed.   LABORATORY DATA:  I have reviewed the data as listed Lab Results  Component Value Date   WBC 6.3 04/03/2016   HGB 10.1 (L) 04/03/2016   HCT 30.5 (L) 04/03/2016   MCV 94.1 04/03/2016   PLT 270 04/03/2016   CMP     Component Value Date/Time   NA 138 12/20/2015 1359   K 4.3 12/20/2015 1359   CL 107 12/20/2015 1359   CO2 26 12/20/2015 1359   GLUCOSE 174  (H) 12/20/2015 1359   BUN 34 (H) 12/20/2015 1359   CREATININE 2.50 (H) 12/20/2015 1359   CALCIUM 8.7 (L) 12/20/2015 1359   PROT 7.3 12/20/2015 1359  ALBUMIN 3.1 (L) 12/20/2015 1359   AST 17 12/20/2015 1359   ALT 15 12/20/2015 1359   ALKPHOS 81 12/20/2015 1359   BILITOT 0.2 (L) 12/20/2015 1359   GFRNONAA 17 (L) 12/20/2015 1359   GFRAA 20 (L) 12/20/2015 1359   RADIOGRAPHIC STUDIES: I have personally reviewed the radiological images as listed and agreed with the findings in the report.    Study Result   CLINICAL DATA:  Stage III chronic renal disease  EXAM: RENAL / URINARY TRACT ULTRASOUND COMPLETE  COMPARISON:  None.  FINDINGS: Right Kidney:  Length: 8.7 cm. Echogenicity is increased. Renal cortical thickness is within normal limits. No perinephric fluid or hydronephrosis visualized. There is a cyst containing a few small septations arising from the right upper to mid kidney region measuring 4.6 x 3.7 x 3.6 cm, a lesion was also present on prior study. There is a simple cyst in the mid right kidney more medially measuring 2.2 x 2.0 x 2.0 cm. There is a calculus in the mid kidney measuring 1.5 cm in length. No ureterectasis.  Left Kidney:  Length: 12.3 cm. Echogenicity is increased. Renal cortical thickness is within normal limits. There are several cysts arising from the left kidney. The largest cyst arising from the left kidney is seen laterally measuring 7.8 x 3.9 x 8.0 cm. There is a second nearby cyst measuring 3.6 x 3.4 x 3.9 cm. Several smaller cysts are noted throughout the left kidney. No sonographically demonstrable calculus or ureterectasis. No mass or hydronephrosis visualized.  Bladder:  Urinary bladder wall appears somewhat thickened. No urinary bladder mass is seen by ultrasound.  IMPRESSION: Kidneys are echogenic bilaterally, a finding indicative of medical renal disease. No obstructing foci identified in either kidney. Renal cortical  thickness is within normal limits bilaterally.  There are cysts in each kidney, 0 left than on the right. A mildly complex cyst in the right kidney is stable. There is a nonobstructing 1.5 mm calculus in the mid right kidney.  Urinary bladder wall is somewhat thickened. Question a degree of cystitis.  Note that the right kidney is significantly smaller than left kidney. Etiology for this finding is uncertain. This finding potentially may indicate renal artery stenosis on the right. In this regard, question whether patient is hypertensive.   Electronically Signed   By: Lowella Grip III M.D.   On: 03/05/2016 13:27     ASSESSMENT & PLAN:  Stage I ER+ PR+ HER 2 + carcinoma of R breast, upper outer quadrant R mastectomy Arimidex Herceptin Anemia CKD, Stage III DEXA with osteopenia 05/2015 Last 2-D echo 05/2015 Lymphedema  L breast screening mammogram due 08/2016 HTN  She is to continue arimidex, calcium and vitamin D. She has osteopenia, we discussed prolia or oral bisphosphonates today. She was given information regarding prolia today. We discussed risks of prolia specifically ONJ.  Labs reviewed. Results are noted above. Anemia most likely secondary to CKD.   I spoke with the patient about, neratinib, for patients with HER-2 positive breast cancer. She was given printed information about this medication. We will further discuss this medication at follow-up.  She is scheduled for repeat screening mammogram on 08/17/2016.  She will return for follow up in 6 to 8 weeks to discuss her decision on the medications discussed today.  All questions were answered. The patient knows to call the clinic with any problems, questions or concerns.  This document serves as a record of services personally performed by Ancil Linsey, MD. It was created  on her behalf by Arlyce Harman, a trained medical scribe. The creation of this record is based on the scribe's personal  observations and the provider's statements to them. This document has been checked and approved by the attending provider.  I have reviewed the above documentation for accuracy and completeness and I agree with the above.  This note was electronically signed.  Molli Hazard, MD  04/03/2016 2:15 PM

## 2016-04-04 LAB — VITAMIN D 25 HYDROXY (VIT D DEFICIENCY, FRACTURES): Vit D, 25-Hydroxy: 36.8 ng/mL (ref 30.0–100.0)

## 2016-04-14 ENCOUNTER — Encounter (HOSPITAL_COMMUNITY): Payer: Self-pay | Admitting: Hematology & Oncology

## 2016-04-17 ENCOUNTER — Ambulatory Visit (HOSPITAL_COMMUNITY)
Admission: RE | Admit: 2016-04-17 | Discharge: 2016-04-17 | Disposition: A | Discharge status: Home / Self Care | Payer: Medicare Other | Source: Ambulatory Visit | Attending: Vascular Surgery | Admitting: Vascular Surgery

## 2016-04-17 ENCOUNTER — Other Ambulatory Visit (HOSPITAL_COMMUNITY): Payer: Self-pay | Admitting: Nephrology

## 2016-04-17 DIAGNOSIS — N183 Chronic kidney disease, stage 3 unspecified: Secondary | ICD-10-CM

## 2016-04-20 ENCOUNTER — Encounter: Payer: Self-pay | Admitting: Nephrology

## 2016-05-04 NOTE — Progress Notes (Signed)
[place], [place] [address]   CLINIC:  Medical Oncology/Hematology  PCP:  Monico Blitz, MD [place] [address] [phone]   REASON FOR VISIT:  Follow-up for Stage IA (T1cN0M0) right breast invasive ductal carcinoma, ER+/PR+/HER2+  CURRENT THERAPY: Anastrozole    BRIEF ONCOLOGIC HISTORY:    Stage 1 infiltrating ductal carcinoma of right female breast ([facility] [facility])   09/12/2014 Mammogram    Mass in upper outer R breast, middle third depth appears slightly larger than it was on prior exam with more irreg spiculated margins      10/02/2014 Pathology Results    biopsy with invasive ductal carcinoma high grade 1.1 cm, dcis solid type. additional R breast tissue, excision, invasive ductal high grade 0.9 cm       10/24/2014 Pathology Results    no residual invasive carcinoma, DCIS, focal, atypical ductal hyperplasia, 0/7 LN positive for metastatic carcinoma ER > 90%, PR 30%, HER 2 2+      10/24/2014 Cancer Staging    T1cN0M0      10/24/2014 Surgery    R mastectomy, T1c, N0      12/28/2014 - 11/22/2015 Chemotherapy    Taxol/herceptin weekly X 12, Herceptin every 21 days, last due in September 2017      03/11/2015 -  Anti-estrogen oral therapy    Arimidex 1 mg daily      09/12/2015 Imaging    MUGA- The left ventricular ejection fraction equals 68%.       INTERVAL HISTORY:  Carly Wood is 81 y.o. female here for routine follow-up for Stage I triple positive right breast cancer.   At her last visit with Dr. Whitney Muse on 04/03/16, she was offered continued adjuvant treatment with neratinib (Nerlynx). She was offered initiating Prolia for osteopenia.    She is here today with one of her sisters. They have talked quite a bit and reviewed the written materials about the Nerlynx and Prolia. Carly Wood expresses that she does not wish to start Prolia. She remains undecided on the Nerlynx and wants to discuss further today.    Denies any breast  complaints.  She continues to tolerate the anastrozole quite well; she does endorse intermittent left shoulder pain, which has been chronic. Denies vaginal dryness or hot flashes. Denies any headache or dizziness. Her bowels are moving regularly. Denies any blood in her stools, melena, hematuria, dysuria, or vaginal bleeding. Her appetite and energy levels are fair.   She is scheduled for mammogram of the left breast in 08/2016. She sees Dr. Hinda Lenis for her chronic kidney disease. Her PCP is Dr. Manuella Ghazi.   REVIEW OF SYSTEMS:  Review of Systems  Constitutional: Negative.  Negative for chills and fever.  HENT:  Negative.  Negative for lump/mass and nosebleeds.   Eyes: Negative.   Respiratory: Negative.  Negative for cough and shortness of breath.   Cardiovascular: Negative.  Negative for chest pain and leg swelling.  Gastrointestinal: Negative.  Negative for abdominal pain, blood in stool, constipation, diarrhea, nausea and vomiting.  Endocrine: Negative.   Genitourinary: Negative.  Negative for dysuria, hematuria and vaginal bleeding.   Musculoskeletal: Negative.  Negative for arthralgias.  Skin: Negative.  Negative for rash.  Neurological: Negative.  Negative for dizziness and headaches.  Hematological: Negative.  Negative for adenopathy. Does not bruise/bleed easily.  Psychiatric/Behavioral: Negative.  Negative for depression and sleep disturbance. The patient is not nervous/anxious.      PAST MEDICAL/SURGICAL HISTORY:  Past Medical History:  Diagnosis Date  . Anemia   . Brain cancer ([facility])    right   . Chronic kidney disease   . Chronic renal disease, stage 4, severely decreased glomerular filtration rate (GFR) between 15-29 mL/min/1.73 square meter (HCC) 08/22/2015  . Diabetes mellitus without complication ([place])   . Stage 1 infiltrating ductal carcinoma of right female breast ([facility]) 08/21/2015   ER+ PR+ HER 2 neu + (3+) T1cN0    No past surgical history on file.   SOCIAL HISTORY:    Social History   Social History  . Marital status: Single    Spouse name: N/A  . Number of children: N/A  . Years of education: N/A   Occupational History  . Not on file.   Social History Main Topics  . Smoking status: Never Smoker  . Smokeless tobacco: Never Used  . Alcohol use No  . Drug use: No  . Sexual activity: Not on file   Other Topics Concern  . Not on file   Social History Narrative  . No narrative on file    FAMILY HISTORY:  No family history on file.  CURRENT MEDICATIONS:  Outpatient Encounter Prescriptions as of 05/05/2016  Medication Sig Note  . acetaminophen (TYLENOL) 500 MG tablet Take by mouth. 08/19/2015: Received from: [facility]  . amLODipine (NORVASC) 5 MG tablet  08/19/2015: Received from: [facility]  . anti-nausea (EMETROL) solution Take by mouth. 08/19/2015: Received from: [facility]  . aspirin 81 MG chewable tablet Chew by mouth. 08/19/2015: Received from: [facility]  . calcium-vitamin D (OSCAL WITH D) 500-200 MG-UNIT tablet Take by mouth. 08/19/2015: Received from: [facility]  . cloNIDine (CATAPRES) 0.3 MG tablet Take by mouth. 08/19/2015: Received from: [facility]  . docusate sodium (COLACE) 100 MG capsule Take by mouth. 08/19/2015: Received from: [facility]  . esomeprazole (NEXIUM) 40 MG capsule Take by mouth. 08/19/2015: Received from: [facility]  . Insulin Degludec (TRESIBA FLEXTOUCH ) Inject 5 Units into the skin.   Mar[facility] lidocaine-prilocaine (EMLA) cream Apply 1 application topically as needed. Apply to port site prior when needed for chemo, labs or port flushes.   [facility] LORazepam (ATIVAN) 0.5 MG tablet Take by mouth. 08/19/2015: Received from: [facility]  . nebivolol (BYSTOLIC) 10 MG tablet Take by mouth. 08/19/2015: Received from: [facility]  . PARoxetine (PAXIL) 20 MG tablet Take by mouth. 08/19/2015: Received from: [facility]  . pioglitazone (ACTOS) 15 MG tablet Take by mouth. 08/19/2015: Received from: [facility]  .  pioglitazone (ACTOS) 30 MG tablet Take 15 mg by mouth 1 day or 1 dose. 08/19/2015: Received from: External Pharmacy Received Sig:   . pravastatin (PRAVACHOL) 20 MG tablet Take by mouth. Reported on 08/30/2015 08/19/2015: Received from: [facility]  . pravastatin (PRAVACHOL) 20 MG tablet Reported on 08/30/2015 08/19/2015: Received from: External Pharmacy  . traZODone (DESYREL) 50 MG tablet  08/19/2015: Received from: External Pharmacy  . VITAMIN B COMPLEX-C PO Take by mouth. 08/19/2015: Received from: [facility]  . [DISCONTINUED] glimepiride (AMARYL) 4 MG tablet Take by mouth. 08/19/2015: Received from: [facility]   No facility-administered encounter medications on file as of 05/05/2016.     ALLERGIES:  Allergies  Allergen Reactions  . Penicillins Other (See Comments)    Unsure of reaction  . Ace Inhibitors Cough and Other (See Comments)    Tongue swell  . Lisinopril Other (See Comments)    Tongue swell     PHYSICAL EXAM:  ECOG Performance status: 1 - Symptomatic,  but largely independent.   Vitals:   05/05/16 1016  BP: (!) 171/73  Pulse: 63  Resp: 18  Temp: 97.9 F (36.6 C)   Filed Weights   05/05/16 1016  Weight: 188 lb 1.6 oz (85.3 kg)    Physical Exam  Constitutional: She is oriented to person, place, and time and well-developed, well-nourished, and in no distress.  HENT:  Head: Normocephalic.  Mouth/Throat: Oropharynx is clear and moist. No oropharyngeal exudate.  Eyes: Conjunctivae are normal. Pupils are equal, round, and reactive to light. No scleral icterus.  Neck: Normal range of motion. Neck supple.  Cardiovascular: Normal rate, regular rhythm and normal heart sounds.   Pulmonary/Chest: Effort normal and breath sounds normal. No respiratory distress.  Abdominal: Soft. Bowel sounds are normal. There is no tenderness.  Musculoskeletal: Normal range of motion. She exhibits edema (Trace LE/ankle/feet edema ).  Lymphadenopathy:    She has no cervical adenopathy.        Right: No supraclavicular adenopathy present.       Left: No supraclavicular adenopathy present.  Neurological: She is alert and oriented to person, place, and time. No cranial nerve deficit. Gait normal.  Skin: Skin is warm and dry. No rash noted.  Psychiatric: Mood, memory, affect and judgment normal.  Nursing note and vitals reviewed.    LABORATORY DATA:  I have reviewed the labs as listed.  CBC    Component Value Date/Time   WBC 6.3 04/03/2016 1330   RBC 3.24 (L) 04/03/2016 1330   HGB 10.1 (L) 04/03/2016 1330   HCT 30.5 (L) 04/03/2016 1330   PLT 270 04/03/2016 1330   MCV 94.1 04/03/2016 1330   MCH 31.2 04/03/2016 1330   MCHC 33.1 04/03/2016 1330   RDW 15.2 04/03/2016 1330   LYMPHSABS 1.2 04/03/2016 1330   MONOABS 0.3 04/03/2016 1330   EOSABS 0.1 04/03/2016 1330   BASOSABS 0.0 04/03/2016 1330   CMP Latest Ref Rng & Units 04/03/2016 12/20/2015 11/22/2015  Glucose 65 - 99 mg/dL 225(H) 174(H) 257(H)  BUN 6 - 20 mg/dL 28(H) 34(H) 29(H)  Creatinine 0.44 - 1.00 mg/dL 2.50(H) 2.50(H) 2.45(H)  Sodium 135 - 145 mmol/L 138 138 138  Potassium 3.5 - 5.1 mmol/L 4.2 4.3 3.6  Chloride 101 - 111 mmol/L 105 107 104  CO2 22 - 32 mmol/L '25 26 25  ' Calcium 8.9 - 10.3 mg/dL 8.8(L) 8.7(L) 8.8(L)  Total Protein 6.5 - 8.1 g/dL 7.3 7.3 7.3  Total Bilirubin 0.3 - 1.2 mg/dL 0.6 0.2(L) 0.4  Alkaline Phos 38 - 126 U/L 80 81 88  AST 15 - 41 U/L '16 17 21  ' ALT 14 - 54 U/L 12(L) 15 16    PENDING LABS:    DIAGNOSTIC IMAGING:  DEXA: 05/16/15      PATHOLOGY:  Right mastectomy surgical path: 10/24/14 [facility])       ASSESSMENT & PLAN:   Stage IA (T1cN0M0) right breast invasive ductal carcinoma, ER+/PR+/HER2+: -s/p right mastectomy in 10/2014; completed adjuvant [facility]ol[facility]ceptin [facility] in [place]; she completed a portion of her maintenance Herceptin here at [facility]; completed therapy on 11/22/15.  -Mammogram scheduled for  08/2016. -Discussed routine follow-up and surveillance for breast cancer including H&P with clinical breast exam every 4-6 months for the first 2 years, then visits may space out more after 2 years.  Encouraged her to report any concerning symptoms and we could certainly see her sooner and get diagnostic imaging, as clinically indicated.  -Continue Arimidex  daily; planned duration of treatment 5-10 years.  -We had lengthy discussion today as she continues to consider neratinib (Nerlynx). Reviewed the goals & purpose of Nerlynx therapy in HER2+ breast cancers. We discussed the most common side effects of therapy, including diarrhea. Discussed we would provide antidiarrheal prophylaxis with Imodium with the following schedule per UpToDate:   Diarrhea generally improves with time. At the end of our discussion, she remains undecided about starting Nerlynx therapy.  I encouraged her to continue thinking about this medication; if she decides not to proceed with Nerlynx, then she will continue to need Anastrozole as stated above.  I asked that she call us within the next week to let us know if she decides to start Nerlynx or not. She agrees with this plan.   Bone health:  -DEXA 05/16/15 showed osteopenia with T-score -1.7. -Recommended she consider Prolia to prevent further decreased bone density with Anastrozole.  -Continue calcium/vitamin D supplementation      Dispo:  -Return to cancer center in 4 months.  -Left breast unilateral mammogram in 08/2016 as scheduled.    All questions were answered to patient's stated satisfaction. Encouraged patient to call with any new concerns or questions before her next visit to the cancer center and we can certain see her sooner, if needed.    Plan of care discussed with Dr. Talbert Cage, who agrees with the above aforementioned.     Orders placed this encounter:  No orders of the defined types were placed in this encounter.     Mike Craze, NP [place] [phone]

## 2016-05-05 ENCOUNTER — Encounter (HOSPITAL_COMMUNITY): Payer: Medicare Other | Attending: Oncology | Admitting: Adult Health

## 2016-05-05 VITALS — BP 171/73 | HR 63 | Temp 97.9°F | Resp 18 | Wt 188.1 lb

## 2016-05-05 DIAGNOSIS — Z170 Estrogen receptor positive status [ER+]: Secondary | ICD-10-CM | POA: Diagnosis not present

## 2016-05-05 DIAGNOSIS — Z79811 Long term (current) use of aromatase inhibitors: Secondary | ICD-10-CM | POA: Diagnosis not present

## 2016-05-05 DIAGNOSIS — C50411 Malignant neoplasm of upper-outer quadrant of right female breast: Secondary | ICD-10-CM | POA: Diagnosis not present

## 2016-05-05 DIAGNOSIS — Z9011 Acquired absence of right breast and nipple: Secondary | ICD-10-CM

## 2016-05-05 DIAGNOSIS — C50911 Malignant neoplasm of unspecified site of right female breast: Secondary | ICD-10-CM | POA: Insufficient documentation

## 2016-05-05 DIAGNOSIS — M8580 Other specified disorders of bone density and structure, unspecified site: Secondary | ICD-10-CM | POA: Diagnosis not present

## 2016-05-05 NOTE — Patient Instructions (Addendum)
[place] at [facility] Discharge Instructions  RECOMMENDATIONS MADE BY THE CONSULTANT AND ANY TEST RESULTS WILL BE SENT TO YOUR REFERRING PHYSICIAN.  Exam with Mike Craze, NP.   Please call us when you decide whether or not you want to start Nerlynx.  Please let us know as soon as possible.  Neratinib tablets What is this medicine? NERATINIB (ner A ti nib) is a medicine that targets proteins in cancer cells and stops the cancer cells from growing. It is used to treat breast cancer. This medicine may be used for other purposes; ask your health care provider or pharmacist if you have questions. COMMON BRAND NAME(S): Nerlynx What should I tell my health care provider before I take this medicine? They need to know if you have any of these conditions: -dehydration -liver disease -an unusual or allergic reaction to neratinib, other medicines, foods, dyes, or preservatives -pregnant or trying to get pregnant -breast-feeding How should I use this medicine? Take this medicine by mouth with a glass of water. Follow the directions on the prescription label. Do not cut, crush or chew this medicine. Do not take with grapefruit juice. Avoid taking this medicine within 3 hours of taking an antacid. Take this medicine with food. Take your medicine at regular intervals. Do not take it more often than directed. Do not stop taking except on your doctor's advice. Talk to your pediatrician regarding the use of this medicine in children. Special care may be needed. Overdosage: If you think you have taken too much of this medicine contact a poison control center or emergency room at once. NOTE: This medicine is only for you. Do not share this medicine with others. What if I miss a dose? If you miss a dose, then do not replace the missed dose. Take the next dose at your regular time. Do not take double or extra doses. What may interact with this medicine? This medicine may interact  with the following medications: -antiviral medicines for HIV or AIDS -aprepitant -bosentan -calcium channel blockers like diltiazem and verapamil -certain antibiotics like clarithromycin, erythromycin and troleandomycin -certain medicines for fungal infections like clotrimazole, fluconazole, ketoconazole, itraconazole, posaconazole, and voriconazole -certain medicines for seizures like carbamazepine, phenobarbital, phenytoin -ciprofloxacin -conivaptan -crizotinib -cyclosporine -dabigatran -digoxin -dronedarone -enzalutamide -fexofenadine -fluvoxamine -grapefruit juice -idelalisib -imatinib -mitotane -modafinil -nefazodone -rifampin -St. John's Wort -stomach acid blockers like cimetidine, famotidine, lansoprazole, ranitidine, or omeprazole -tofisopam This list may not describe all possible interactions. Give your health care provider a list of all the medicines, herbs, non-prescription drugs, or dietary supplements you use. Also tell them if you smoke, drink alcohol, or use illegal drugs. Some items may interact with your medicine. What should I watch for while using this medicine? Tell your doctor or healthcare professional if your symptoms do not start to get better or if they get worse. Do not become pregnant while taking this medicine or for 1 month after stopping it. Women should inform their doctor if they wish to become pregnant or think they might be pregnant. Men should not father a child while taking this medicine and for 3 months after stopping it. There is a potential for serious side effects to an unborn child. Talk to your health care professional or pharmacist for more information. Do not breast-feed an infant while taking this medicine or for 1 month after stopping it. Check with your doctor or health care professional if you get an attack of severe diarrhea, nausea and vomiting, or if you  sweat a lot. The loss of too much body fluid can make it dangerous for you to  take this medicine. You may need blood work done while you are taking this medicine. What side effects may I notice from receiving this medicine? Side effects that you should report to your doctor or health care professional as soon as possible: -diarrhea -nausea, vomiting -signs and symptoms of liver injury like dark yellow or brown urine; general ill feeling or flu-like symptoms; light-colored stools; loss of appetite; nausea; right upper belly pain; unusually weak or tired; yellowing of the eyes or skin -signs and symptoms of low blood pressure like dizziness; feeling faint or lightheaded, falls; unusually weak or tired -signs of infection - fever or chills, cough, sore throat, pain or difficulty passing urine Side effects that usually do not require medical attention (report these to your doctor or health care professional if they continue or are bothersome): -dry mouth -loss of appetite -mouth sores -muscle cramps -red spots on the skin -sore throat -stomach pain -upset stomach -weight loss This list may not describe all possible side effects. Call your doctor for medical advice about side effects. You may report side effects to FDA at 1-800-FDA-1088. Where should I keep my medicine? Keep out of the reach of children. Store between 20 and 25 degrees C (68 and 77 degrees F). Throw away any unused medicine after the expiration date. NOTE: This sheet is a summary. It may not cover all possible information. If you have questions about this medicine, talk to your doctor, pharmacist, or health care provider.  2018 Elsevier/Gold Standard (2015-09-24 17:44:20)   Return to the clinic in 4 months with labs.  Please see Amy as you leave for appointments.  Thank you for choosing [place] at [facility] to provide your oncology and hematology care.  To afford each patient quality time with our provider, please arrive at least 15 minutes before your scheduled appointment  time.    If you have a lab appointment with the [facility] please come in thru the  Main Entrance and check in at the main information desk  You need to re-schedule your appointment should you arrive 10 or more minutes late.  We strive to give you quality time with our providers, and arriving late affects you and other patients whose appointments are after yours.  Also, if you no show three or more times for appointments you may be dismissed from the clinic at the providers discretion.     Again, thank you for choosing [facility].  Our hope is that these requests will decrease the amount of time that you wait before being seen by our physicians.       _____________________________________________________________  Should you have questions after your visit to [facility], please contact our office at [phone] between the hours of 8:30 a.m. and 4:30 p.m.  Voicemails left after 4:30 p.m. will not be returned until the following business day.  For prescription refill requests, have your pharmacy contact our office.       Resources For Cancer Patients and their Caregivers ? American Cancer Society: Can assist with transportation, wigs, general needs, runs Look Good Feel Better.        ([phone] 506-4252 ? Cancer Care: Provides financial assistance, online support groups, medication/co-pay assistance.  1-800-813-HOPE 620-547-5621) ? [facility] Assists [place] cancer patients and their families through emotional , educational and financial support.  567-300-4902 ?  [place] DSS Where to apply for food stamps, Medicaid and utility assistance. 979-301-5772 ? RCATS: Transportation to medical appointments. 323-427-3571 ? Social Security Administration: May apply for disability if have a Stage IV cancer. (575)578-2884 (563)576-4029 ? [employer], Disability and Transit Services: Assists with nutrition, care and transit needs.  [place] Support Programs: @10RELATIVEDAYS @ > Cancer Support Group  2nd Tuesday of the month 1pm-2pm, Journey Room  > Creative Journey  3rd Tuesday of the month 1130am-1pm, Journey Room  > Look Good Feel Better  1st Wednesday of the month 10am-12 noon, Journey Room (Call [facility] to register [phone])

## 2016-05-06 ENCOUNTER — Other Ambulatory Visit (HOSPITAL_COMMUNITY): Payer: Self-pay | Admitting: *Deleted

## 2016-05-06 ENCOUNTER — Other Ambulatory Visit (HOSPITAL_COMMUNITY): Payer: Self-pay | Admitting: Adult Health

## 2016-05-06 ENCOUNTER — Encounter (HOSPITAL_COMMUNITY): Payer: Self-pay | Admitting: Adult Health

## 2016-05-06 DIAGNOSIS — D472 Monoclonal gammopathy: Secondary | ICD-10-CM

## 2016-05-06 NOTE — Progress Notes (Signed)
Received reports from Dr. Florentina Addison office with the following abnormal findings:   Lab studies dated 03/05/16:  24-hr urine protein:  4412 (HIGH)  M-spike (serum): 1.0 (HIGH)   IgA (serum): 1343 mg/dL (HIGH)  Calcium: 9.1 mg/dL Albumin: 3.4 g/dL  ---  Patient was seen yesterday for Stage IA triple positive breast cancer.  We will bring the patient in for labs & spine survey. We will bring her back in for review of these results once they are made available. The following orders were placed. Nursing to help coordinate appts.     Mike Craze, NP [place] [phone]

## 2016-05-13 ENCOUNTER — Telehealth (HOSPITAL_COMMUNITY): Payer: Self-pay | Admitting: Emergency Medicine

## 2016-05-13 ENCOUNTER — Encounter (HOSPITAL_COMMUNITY): Payer: Medicare Other | Attending: Oncology

## 2016-05-13 ENCOUNTER — Other Ambulatory Visit (HOSPITAL_COMMUNITY): Payer: Self-pay | Admitting: Adult Health

## 2016-05-13 ENCOUNTER — Ambulatory Visit (HOSPITAL_COMMUNITY)
Admission: RE | Admit: 2016-05-13 | Discharge: 2016-05-13 | Disposition: A | Discharge status: Home / Self Care | Payer: Medicare Other | Source: Ambulatory Visit | Attending: Adult Health | Admitting: Adult Health

## 2016-05-13 ENCOUNTER — Encounter (HOSPITAL_COMMUNITY): Payer: Self-pay | Admitting: Adult Health

## 2016-05-13 VITALS — BP 169/79 | HR 74 | Temp 98.2°F | Resp 18

## 2016-05-13 DIAGNOSIS — M4856XA Collapsed vertebra, not elsewhere classified, lumbar region, initial encounter for fracture: Secondary | ICD-10-CM | POA: Insufficient documentation

## 2016-05-13 DIAGNOSIS — C50911 Malignant neoplasm of unspecified site of right female breast: Secondary | ICD-10-CM | POA: Insufficient documentation

## 2016-05-13 DIAGNOSIS — M889 Osteitis deformans of unspecified bone: Secondary | ICD-10-CM | POA: Diagnosis not present

## 2016-05-13 DIAGNOSIS — D472 Monoclonal gammopathy: Secondary | ICD-10-CM | POA: Diagnosis present

## 2016-05-13 DIAGNOSIS — D259 Leiomyoma of uterus, unspecified: Secondary | ICD-10-CM | POA: Diagnosis not present

## 2016-05-13 DIAGNOSIS — K8020 Calculus of gallbladder without cholecystitis without obstruction: Secondary | ICD-10-CM | POA: Insufficient documentation

## 2016-05-13 DIAGNOSIS — I700 Atherosclerosis of aorta: Secondary | ICD-10-CM | POA: Insufficient documentation

## 2016-05-13 DIAGNOSIS — Z95828 Presence of other vascular implants and grafts: Secondary | ICD-10-CM

## 2016-05-13 DIAGNOSIS — C50919 Malignant neoplasm of unspecified site of unspecified female breast: Secondary | ICD-10-CM

## 2016-05-13 LAB — COMPREHENSIVE METABOLIC PANEL
ALK PHOS: 82 U/L (ref 38–126)
ALT: 14 U/L (ref 14–54)
ANION GAP: 8 (ref 5–15)
AST: 22 U/L (ref 15–41)
Albumin: 3.1 g/dL — ABNORMAL LOW (ref 3.5–5.0)
BILIRUBIN TOTAL: 0.2 mg/dL — AB (ref 0.3–1.2)
BUN: 30 mg/dL — AB (ref 6–20)
CALCIUM: 8.8 mg/dL — AB (ref 8.9–10.3)
CO2: 24 mmol/L (ref 22–32)
Chloride: 105 mmol/L (ref 101–111)
Creatinine, Ser: 2.49 mg/dL — ABNORMAL HIGH (ref 0.44–1.00)
GFR calc Af Amer: 20 mL/min — ABNORMAL LOW (ref >60)
GFR calc non Af Amer: 17 mL/min — ABNORMAL LOW (ref >60)
GLUCOSE: 243 mg/dL — AB (ref 65–99)
Potassium: 4 mmol/L (ref 3.5–5.1)
SODIUM: 137 mmol/L (ref 135–145)
TOTAL PROTEIN: 7.5 g/dL (ref 6.5–8.1)

## 2016-05-13 LAB — CBC WITH DIFFERENTIAL/PLATELET
BASOS ABS: 0 10*3/uL (ref 0.0–0.1)
BASOS PCT: 1 %
Eosinophils Absolute: 0.1 10*3/uL (ref 0.0–0.7)
Eosinophils Relative: 2 %
HEMATOCRIT: 30.9 % — AB (ref 36.0–46.0)
HEMOGLOBIN: 10.1 g/dL — AB (ref 12.0–15.0)
Lymphocytes Relative: 18 %
Lymphs Abs: 1.1 10*3/uL (ref 0.7–4.0)
MCH: 30.7 pg (ref 26.0–34.0)
MCHC: 32.7 g/dL (ref 30.0–36.0)
MCV: 93.9 fL (ref 78.0–100.0)
MONOS PCT: 4 %
Monocytes Absolute: 0.3 10*3/uL (ref 0.1–1.0)
NEUTROS ABS: 4.4 10*3/uL (ref 1.7–7.7)
NEUTROS PCT: 75 %
Platelets: 269 10*3/uL (ref 150–400)
RBC: 3.29 MIL/uL — AB (ref 3.87–5.11)
RDW: 15.1 % (ref 11.5–15.5)
WBC: 5.9 10*3/uL (ref 4.0–10.5)

## 2016-05-13 MED ORDER — NERATINIB MALEATE 40 MG PO TABS: 240.0000 mg | ORAL_TABLET | Freq: Every day | ORAL | 0 refills | Status: DC

## 2016-05-13 MED ORDER — HEPARIN SOD (PORK) LOCK FLUSH 100 UNIT/ML IV SOLN
500.0000 [IU] | Freq: Once | INTRAVENOUS | Status: AC
  Administered 2016-05-13: 500 [IU] via INTRAVENOUS
  Filled 2016-05-13: qty 5

## 2016-05-13 MED ORDER — SODIUM CHLORIDE 0.9% FLUSH
10.0000 mL | Freq: Once | INTRAVENOUS | Status: AC
  Administered 2016-05-13: 10 mL via INTRAVENOUS

## 2016-05-13 NOTE — Progress Notes (Signed)
Carly Wood presented for Portacath access and flush. Proper placement of portacath confirmed by CXR. Portacath located left chest wall accessed with  H 20 needle. Good blood return present. Portacath flushed with 97ml NS and 500U/67ml Heparin and needle removed intact. Procedure without incident. Patient tolerated procedure well.

## 2016-05-13 NOTE — Patient Instructions (Signed)
[place] at [facility] Discharge Instructions  RECOMMENDATIONS MADE BY THE CONSULTANT AND ANY TEST RESULTS WILL BE SENT TO YOUR REFERRING PHYSICIAN.  Port flush with lab work done today as ordered. Return as scheduled.  Thank you for choosing [place] at [facility] to provide your oncology and hematology care.  To afford each patient quality time with our provider, please arrive at least 15 minutes before your scheduled appointment time.    If you have a lab appointment with the [facility] please come in thru the  Main Entrance and check in at the main information desk  You need to re-schedule your appointment should you arrive 10 or more minutes late.  We strive to give you quality time with our providers, and arriving late affects you and other patients whose appointments are after yours.  Also, if you no show three or more times for appointments you may be dismissed from the clinic at the providers discretion.     Again, thank you for choosing [facility].  Our hope is that these requests will decrease the amount of time that you wait before being seen by our physicians.       _____________________________________________________________  Should you have questions after your visit to [facility], please contact our office at [phone] between the hours of 8:30 a.m. and 4:30 p.m.  Voicemails left after 4:30 p.m. will not be returned until the following business day.  For prescription refill requests, have your pharmacy contact our office.       Resources For Cancer Patients and their Caregivers ? American Cancer Society: Can assist with transportation, wigs, general needs, runs Look Good Feel Better.        907-215-4895 ? Cancer Care: Provides financial assistance, online support groups, medication/co-pay assistance.  1-800-813-HOPE (413)003-6638) ? [facility] Assists [place]  cancer patients and their families through emotional , educational and financial support.  417-753-3117 ? [place] DSS Where to apply for food stamps, Medicaid and utility assistance. 936-757-6429 ? RCATS: Transportation to medical appointments. 6474711047 ? Social Security Administration: May apply for disability if have a Stage IV cancer. 920-425-9655 223-343-9299 ? [employer], Disability and Transit Services: Assists with nutrition, care and transit needs. [place] Support Programs: @10RELATIVEDAYS @ > Cancer Support Group  2nd Tuesday of the month 1pm-2pm, Journey Room  > Creative Journey  3rd Tuesday of the month 1130am-1pm, Journey Room  > Look Good Feel Better  1st Wednesday of the month 10am-12 noon, Journey Room (Call [place] to register [phone])

## 2016-05-13 NOTE — Telephone Encounter (Signed)
Called pt and the sister answered the phone. She states that Carly Wood does want to start taking the Nerlynx.  [place] notified.

## 2016-05-13 NOTE — Progress Notes (Signed)
Received notification from Shellia Carwin, RN that patient has decided to try neratinib.   Neratinib 240 mg po daily prescription printed and given to Angie, patient advocate, for insurance approval.    Mike Craze, NP [place] [phone]

## 2016-05-14 ENCOUNTER — Telehealth (HOSPITAL_COMMUNITY): Payer: Self-pay

## 2016-05-14 ENCOUNTER — Encounter (HOSPITAL_COMMUNITY): Payer: Self-pay | Admitting: Emergency Medicine

## 2016-05-14 ENCOUNTER — Other Ambulatory Visit (HOSPITAL_COMMUNITY): Payer: Self-pay | Admitting: Oncology

## 2016-05-14 LAB — PROTEIN ELECTROPHORESIS, SERUM
A/G Ratio: 0.7 (ref 0.7–1.7)
ALBUMIN ELP: 2.9 g/dL (ref 2.9–4.4)
ALPHA-1-GLOBULIN: 0.3 g/dL (ref 0.0–0.4)
ALPHA-2-GLOBULIN: 0.9 g/dL (ref 0.4–1.0)
BETA GLOBULIN: 1.8 g/dL — AB (ref 0.7–1.3)
Gamma Globulin: 1 g/dL (ref 0.4–1.8)
Globulin, Total: 4 g/dL — ABNORMAL HIGH (ref 2.2–3.9)
M-SPIKE, %: 0.7 g/dL — AB
TOTAL PROTEIN ELP: 6.9 g/dL (ref 6.0–8.5)

## 2016-05-14 LAB — KAPPA/LAMBDA LIGHT CHAINS
Kappa free light chain: 68.1 mg/L — ABNORMAL HIGH (ref 3.3–19.4)
Kappa, lambda light chain ratio: 0.2 — ABNORMAL LOW (ref 0.26–1.65)
LAMDA FREE LIGHT CHAINS: 339.9 mg/L — AB (ref 5.7–26.3)

## 2016-05-14 LAB — IGG, IGA, IGM
IGG (IMMUNOGLOBIN G), SERUM: 958 mg/dL (ref 700–1600)
IgA: 1216 mg/dL — ABNORMAL HIGH (ref 64–422)
IgM, Serum: 15 mg/dL — ABNORMAL LOW (ref 26–217)

## 2016-05-14 MED ORDER — LOPERAMIDE HCL 2 MG PO TABS: ORAL_TABLET | ORAL | 3 refills | Status: DC

## 2016-05-14 MED ORDER — ONDANSETRON HCL 8 MG PO TABS: 8.0000 mg | ORAL_TABLET | Freq: Three times a day (TID) | ORAL | 2 refills | Status: DC | PRN

## 2016-05-14 MED ORDER — PROCHLORPERAZINE MALEATE 10 MG PO TABS: 10.0000 mg | ORAL_TABLET | Freq: Four times a day (QID) | ORAL | 2 refills | Status: DC | PRN

## 2016-05-14 MED ORDER — ANASTROZOLE 1 MG PO TABS: 1.0000 mg | ORAL_TABLET | Freq: Every day | ORAL | 3 refills | Status: DC

## 2016-05-14 NOTE — Progress Notes (Signed)
Chemotherapy education pulled together.  meds escribed.

## 2016-05-14 NOTE — Telephone Encounter (Signed)
Patients sister called requesting refill on anastrozole. Reviewed with MD. New prescription sent to patients pharmacy. Sister is also concerned about the out of pocket expense for the Neratinib. Explained that Angie, Development worker, community is working on it and once she hears from insurance she will contact patient. Sister verbalized understanding.

## 2016-05-14 NOTE — Patient Instructions (Signed)
[facility]   CHEMOTHERAPY INSTRUCTIONS  You are going to start on Nerlynx for your breast cancer for 1 year.  You will take 6 tablets (240 mg) at one time once a day with food.    POTENTIAL SIDE EFFECTS OF TREATMENT: What Neratinib Is Used For Breast Cancer Note: If a drug has been approved for one use, physicians may elect to use this same drug for other problems if they believe it may be helpful.  How Neratinib Is Given Neratinib tablets come in one strength, 40 mg.  Taken as a pill by mouth.  Take prescribed dose once daily by mouth with food.  Take at approximately the same time each day.  If you miss a dose of neratinib, resume therapy with the next scheduled daily dose; do not replace the missed dose.  Do not crush, chew, split, or dissolve tablets.  Side Effects Important things to remember about the side effects of neratinib: Most people will not experience all of the side effects listed for neratinib.  Neratinib side effects are often predictable in terms of their onset, duration, and severity.  Neratinib side effects will improve after therapy is complete.  Neratinib side effects may be manageable, there are many options to minimize or prevent the side effects of neratinib.  There is no relationship between the presence or severity of side effects and the effectiveness of the medication.  The side effects of neratinib and their severity depend on how much of the drug is given. In other words, high doses may produce more severe side effects.  The following side effects are common (occurring in greater than 30%) of patients taking neratinib: Diarrhea  Nausea and Vomiting  Stomach pain  These side effects are less common side effects (occurring in about 10-29%) of patients taking neratinib: Fatigue  Poor appetite  Skin rash  Muscle spasms  Pain or sores inside the mouth Not all side effects are listed above. Some that are rare  (occurring in less than 10% of patients) are not listed here. However, you should always inform your health care provider if you experience any unusual symptoms.  When To Contact Your Doctor or [facility] Provider The following symptoms require medical attention, but are not an emergency. Contact your health care provider within 24 hours of noticing any of the following: Signs of an allergic reaction such as rash, hives, itching, red, swollen, blistered, or peeling skin with or without fever. Wheezing, trouble breathing, talking, or swallowing, swelling of the mouth, face, lips, tongue, or throat.  Nausea (interferes with ability to eat and unrelieved with prescribed medication)  Vomiting (vomiting more than 4-5 times in a 24-hour period)  Diarrhea (more than 4-6 episodes in a 24-hour period)  Swelling of the belly  Black or tarry stools, or blood in your stools or urine  Extreme fatigue (unable to carry on self-care activities)  Confusion, muscle pain, weakness, abnormal heartbeat  Problems passing urine or a decrease in amount of urine passed  Signs of a urinary tract infection, burning or pain when passing urine, feeling the need to pass urine often or right away, fever.  Dark urine, feeling tired, not hungry, light colored stools, yellow skin or eyes. Always inform your health care provider if you experience any unusual symptoms.  Precautions Before starting neratinib treatment, make sure you tell your doctor about any other medications you are taking (including prescription, over-the-counter, vitamins, herbal remedies, etc.).  Avoid taking H2-receptor antagonists (famotidine,  cimetidine, ranitidine), proton pump inhibitors (omeprazole, lansoprazole, pantoprazole), colchicine, clotrimazole, or ciprofloxacin with neratinib. If needed, take antacids 3 hours before taking neratinib.  Avoid grapefruit juice while taking neratinib.  If you are taking digoxin, talk with your doctor. You may need  to have your blood work checked more closely while you are taking it with this drug.  Inform your health care professional if you are pregnant or may be pregnant prior to starting this treatment. Neratinib may cause fetal harm. Women of reproductive potential should have a pregnancy test prior to treatment. Effective contraception should be used during therapy and for at least 1 month after the last dose.  Female patients with female partners of reproductive potential should also use effective contraception during therapy and for at least 3 months after the last dose.  Breastfeeding is not recommended during therapy or for at least 1 month after the last dose.  Self-Care Tips Drink at least two to three quarts of fluid every 24 hours, unless you are instructed otherwise.  To reduce nausea, take anti-nausea medications as prescribed by your doctor, and eat small, frequent meals.  An anti-diarrhea medication may be needed during the first two cycles of neratinib treatment. Follow regimen of anti-diarrhea medications as prescribed by your health care professional.  Eat foods that may help reduce diarrhea (see managing side effects - diarrhea).  You may experience drowsiness or fatigue; avoid driving or engaging in tasks that require alertness until your response to the drug is known.  Get plenty of rest.  Maintain good nutrition.  Remain active as you are able. Gentle exercise is encouraged such as walking daily.  If you experience symptoms or side effects, be sure to discuss them with your health care team, they can prescribe medications and/or offer other suggestions that are effective in managing such problems.   SELF CARE ACTIVITIES WHILE ON CHEMOTHERAPY: Hydration Increase your fluid intake 48 hours prior to treatment and drink at least 8 to 12 cups (64 ounces) of water/decaff beverages per day after treatment. You can still have your cup of coffee or soda but these beverages do not count as part  of your 8 to 12 cups that you need to drink daily. No alcohol intake.  Medications Continue taking your normal prescription medication as prescribed.  If you start any new herbal or new supplements please let us know first to make sure it is safe.  Mouth Care Have teeth cleaned professionally before starting treatment. Keep dentures and partial plates clean. Use soft toothbrush and do not use mouthwashes that contain alcohol. Biotene is a good mouthwash that is available at most pharmacies or may be ordered by calling [phone]. Use warm salt water gargles (1 teaspoon salt per 1 quart warm water) before and after meals and at bedtime. Or you may rinse with 2 tablespoons of three-percent hydrogen peroxide mixed in eight ounces of water. If you are still having problems with your mouth or sores in your mouth please call the clinic. If you need dental work, please let the doctor know before you go for your appointment so that we can coordinate the best possible time for you in regards to your chemo regimen. You need to also let your dentist know that you are actively taking chemo. We may need to do labs prior to your dental appointment.   Skin Care Always use sunscreen that has not expired and with SPF (Sun Protection Factor) of 50 or higher. Wear hats to protect your  head from the sun. Remember to use sunscreen on your hands, ears, face, & feet.  Use good moisturizing lotions such as udder cream, eucerin, or even Vaseline. Some chemotherapies can cause dry skin, color changes in your skin and nails.    . Avoid long, hot showers or baths. . Use gentle, fragrance-free soaps and laundry detergent. . Use moisturizers, preferably creams or ointments rather than lotions because the thicker consistency is better at preventing skin dehydration. Apply the cream or ointment within 15 minutes of showering. Reapply moisturizer at night, and moisturize your hands every time after you wash them.  Hair Loss (if  your doctor says your hair will fall out)  . If your doctor says that your hair is likely to fall out, decide before you begin chemo whether you want to wear a wig. You may want to shop before treatment to match your hair color. . Hats, turbans, and scarves can also camouflage hair loss, although some people prefer to leave their heads uncovered. If you go bare-headed outdoors, be sure to use sunscreen on your scalp. . Cut your hair short. It eases the inconvenience of shedding lots of hair, but it also can reduce the emotional impact of watching your hair fall out. . Don't perm or color your hair during chemotherapy. Those chemical treatments are already damaging to hair and can enhance hair loss. Once your chemo treatments are done and your hair has grown back, it's OK to resume dyeing or perming hair. With chemotherapy, hair loss is almost always temporary. But when it grows back, it may be a different color or texture. In older adults who still had hair color before chemotherapy, the new growth may be completely gray.  Often, new hair is very fine and soft.  Infection Prevention Please wash your hands for at least 30 seconds using warm soapy water. Handwashing is the #1 way to prevent the spread of germs. Stay away from sick people or people who are getting over a cold. If you develop respiratory systems such as green/yellow mucus production or productive cough or persistent cough let us know and we will see if you need an antibiotic. It is a good idea to keep a pair of gloves on when going into grocery stores/[employer] to decrease your risk of coming into contact with germs on the carts, etc. Carry alcohol hand gel with you at all times and use it frequently if out in public. If your temperature reaches 100.5 or higher please call the clinic and let us know.  If it is after hours or on the weekend please go to the ER if your temperature is over 100.5.  Please have your own personal thermometer at home to  use.    Sex and bodily fluids If you are going to have sex, a condom must be used to protect the person that isn't taking chemotherapy. Chemo can decrease your libido (sex drive). For a few days after chemotherapy, chemotherapy can be excreted through your bodily fluids.  When using the toilet please close the lid and flush the toilet twice.  Do this for a few day after you have had chemotherapy.     Effects of chemotherapy on your sex life Some changes are simple and won't last long. They won't affect your sex life permanently. Sometimes you may feel: . too tired . not strong enough to be very active . sick or sore  . not in the mood . anxious or low Your anxiety might not  seem related to sex. For example, you may be worried about the cancer and how your treatment is going. Or you may be worried about money, or about how you family are coping with your illness. These things can cause stress, which can affect your interest in sex. It's important to talk to your partner about how you feel. Remember - the changes to your sex life don't usually last long. There's usually no medical reason to stop having sex during chemo. The drugs won't have any long term physical effects on your performance or enjoyment of sex. Cancer can't be passed on to your partner during sex  Contraception It's important to use reliable contraception during treatment. Avoid getting pregnant while you or your partner are having chemotherapy. This is because the drugs may harm the baby. Sometimes chemotherapy drugs can leave a man or woman infertile.  This means you would not be able to have children in the future. You might want to talk to someone about permanent infertility. It can be very difficult to learn that you may no longer be able to have children. Some people find counselling helpful. There might be ways to preserve your fertility, although this is easier for men than for women. You may want to speak to a fertility  expert. You can talk about sperm banking or harvesting your eggs. You can also ask about other fertility options, such as donor eggs. If you have or have had breast cancer, your doctor might advise you not to take the contraceptive pill. This is because the hormones in it might affect the cancer.  It is not known for sure whether or not chemotherapy drugs can be passed on through semen or secretions from the vagina. Because of this some doctors advise people to use a barrier method if you have sex during treatment. This applies to vaginal, anal or oral sex. Generally, doctors advise a barrier method only for the time you are actually having the treatment and for about a week after your treatment. Advice like this can be worrying, but this does not mean that you have to avoid being intimate with your partner. You can still have close contact with your partner and continue to enjoy sex.  Animals If you have cats or birds we just ask that you not change the litter or change the cage.  Please have someone else do this for you while you are on chemotherapy.   Food Safety During and After Cancer Treatment Food safety is important for people both during and after cancer treatment. Cancer and cancer treatments, such as chemotherapy, radiation therapy, and stem cell/bone marrow transplantation, often weaken the immune system. This makes it harder for your body to protect itself from foodborne illness, also called food poisoning. Foodborne illness is caused by eating food that contains harmful bacteria, parasites, or viruses.  Foods to avoid Some foods have a higher risk of becoming tainted with bacteria. These include: Marland Kitchen Unwashed fresh fruit and vegetables, especially leafy vegetables that can hide dirt and other contaminants . Raw sprouts, such as alfalfa sprouts . Raw or undercooked beef, especially ground beef, or other raw or undercooked meat and poultry . Fatty, fried, or spicy foods immediately before  or after treatment.  These can sit heavy on your stomach and make you feel nauseous. . Raw or undercooked shellfish, such as oysters. . Sushi and sashimi, which often contain raw fish.  . Unpasteurized beverages, such as unpasteurized fruit juices, raw milk, raw yogurt, or cider .  Undercooked eggs, such as soft boiled, over easy, and poached; raw, unpasteurized eggs; or foods made with raw egg, such as homemade raw cookie dough and homemade mayonnaise Simple steps for food safety Shop smart. . Do not buy food stored or displayed in an unclean area. . Do not buy bruised or damaged fruits or vegetables. . Do not buy cans that have cracks, dents, or bulges. . Pick up foods that can spoil at the end of your shopping trip and store them in a cooler on the way home. Prepare and clean up foods carefully. . Rinse all fresh fruits and vegetables under running water, and dry them with a clean towel or paper towel. . Clean the top of cans before opening them. . After preparing food, wash your hands for 20 seconds with hot water and soap. Pay special attention to areas between fingers and under nails. . Clean your utensils and dishes with hot water and soap. Marland Kitchen Disinfect your kitchen and cutting boards using 1 teaspoon of liquid, unscented bleach mixed into 1 quart of water.   Dispose of old food. . Eat canned and packaged food before its expiration date (the "use by" or "best before" date). . Consume refrigerated leftovers within 3 to 4 days. After that time, throw out the food. Even if the food does not smell or look spoiled, it still may be unsafe. Some bacteria, such as Listeria, can grow even on foods stored in the refrigerator if they are kept for too long. Take precautions when eating out. . At restaurants, avoid buffets and salad bars where food sits out for a long time and comes in contact with many people. Food can become contaminated when someone with a virus, often a norovirus, or another "bug"  handles it. . Put any leftover food in a "to-go" container yourself, rather than having the server do it. And, refrigerate leftovers as soon as you get home. . Choose restaurants that are clean and that are willing to prepare your food as you order it cooked.    MEDICATIONS: Imodium (loperamide) 4 mg:  Days 1 to 14:  Imodium 4 mg by mouth three times a day   Days 15 to 56:  Imodium 4 mg by mouth two times a day  Days 57 to 365:  Imodium 4 mg by mouth as needed (maximum dose 16 mg/day)                                                                                                                                                                Zofran/Ondansetron 8mg  tablet. Take 1 tablet every 8 hours as needed for nausea/vomiting. (#1 nausea med to take, this can constipate)  Compazine/Prochlorperazine 10mg  tablet. Take 1 tablet every 6 hours as needed for nausea/vomiting. (#2  nausea med to take, this can make you sleepy)    Nausea Sheet  Zofran/Ondansetron 8mg  tablet. Take 1 tablet every 8 hours as needed for nausea/vomiting. (#1 nausea med to take, this can constipate)  Compazine/Prochlorperazine 10mg  tablet. Take 1 tablet every 6 hours as needed for nausea/vomiting. (#2 nausea med to take, this can make you sleepy)  You can take these medications together or separately.  We would first like for you to try the Ondansetron by itself and then take the Prochloperizine if needed. But you are allowed to take both medications at the same time if your nausea is that severe.  If you are having persistent nausea (nausea that does not stop) please take these medications on a staggered schedule so that the nausea medication stays in your body.  Please call the [facility] and let us know the amount of nausea that you are experiencing.  If you begin to vomit, you need to call the [facility] and if it is the weekend and you have vomited more than one time and cant get it to stop-go to the Emergency  Room.  Persistent nausea/vomiting can lead to dehydration (loss of fluid in your body) and will make you feel terrible.   Ice chips, sips of clear liquids, foods that are @ room temperature, crackers, and toast tend to be better tolerated.     SYMPTOMS TO REPORT AS SOON AS POSSIBLE AFTER TREATMENT:  FEVER GREATER THAN 100.5 F  CHILLS WITH OR WITHOUT FEVER  NAUSEA AND VOMITING THAT IS NOT CONTROLLED WITH YOUR NAUSEA MEDICATION  UNUSUAL SHORTNESS OF BREATH  UNUSUAL BRUISING OR BLEEDING  TENDERNESS IN MOUTH AND THROAT WITH OR WITHOUT PRESENCE OF ULCERS  URINARY PROBLEMS  BOWEL PROBLEMS  UNUSUAL RASH    Wear comfortable clothing and clothing appropriate for easy access to any Portacath or PICC line. Let us know if there is anything that we can do to make your therapy better!    What to do if you need assistance after hours or on the weekends: CALL [phone].  HOLD on the line, do not hang up.  You will hear multiple messages but at the end you will be connected with a nurse triage line.  They will contact the doctor if necessary.  Most of the time they will be able to assist you.  Do not call the hospital operator.      I have been informed and understand all of the instructions given to me and have received a copy. I have been instructed to call the clinic [phone] or my family physician as soon as possible for continued medical care, if indicated. I do not have any more questions at this time but understand that I may call the [facility] or the Patient Navigator at [phone] during office hours should I have questions or need assistance in obtaining follow-up care.

## 2016-05-15 LAB — IMMUNOFIXATION ELECTROPHORESIS
IGA: 1257 mg/dL — AB (ref 64–422)
IGM, SERUM: 15 mg/dL — AB (ref 26–217)
IgG (Immunoglobin G), Serum: 979 mg/dL (ref 700–1600)
TOTAL PROTEIN ELP: 6.9 g/dL (ref 6.0–8.5)

## 2016-05-19 ENCOUNTER — Telehealth (HOSPITAL_COMMUNITY): Payer: Self-pay | Admitting: *Deleted

## 2016-05-22 ENCOUNTER — Encounter (HOSPITAL_BASED_OUTPATIENT_CLINIC_OR_DEPARTMENT_OTHER): Payer: Medicare Other | Admitting: Adult Health

## 2016-05-22 ENCOUNTER — Encounter (HOSPITAL_COMMUNITY): Payer: Self-pay | Admitting: Adult Health

## 2016-05-22 ENCOUNTER — Encounter (HOSPITAL_COMMUNITY): Payer: Medicare Other

## 2016-05-22 VITALS — BP 172/90 | HR 78 | Temp 98.7°F | Resp 18 | Wt 188.4 lb

## 2016-05-22 DIAGNOSIS — C50411 Malignant neoplasm of upper-outer quadrant of right female breast: Secondary | ICD-10-CM

## 2016-05-22 DIAGNOSIS — Z170 Estrogen receptor positive status [ER+]: Secondary | ICD-10-CM

## 2016-05-22 DIAGNOSIS — C50911 Malignant neoplasm of unspecified site of right female breast: Secondary | ICD-10-CM

## 2016-05-22 DIAGNOSIS — D472 Monoclonal gammopathy: Secondary | ICD-10-CM

## 2016-05-22 DIAGNOSIS — Z79811 Long term (current) use of aromatase inhibitors: Secondary | ICD-10-CM | POA: Diagnosis not present

## 2016-05-22 NOTE — Patient Instructions (Signed)
[facility] at [facility] Discharge Instructions  RECOMMENDATIONS MADE BY THE CONSULTANT AND ANY TEST RESULTS WILL BE SENT TO YOUR REFERRING PHYSICIAN.  Bone marrow biopsy in [place] in 1 month  Return to see Gretchen 3 weeks after biopsy.  Thank you for choosing [place] at [facility] to provide your oncology and hematology care.  To afford each patient quality time with our provider, please arrive at least 15 minutes before your scheduled appointment time.    If you have a lab appointment with the [facility] please come in thru the  Main Entrance and check in at the main information desk  You need to re-schedule your appointment should you arrive 10 or more minutes late.  We strive to give you quality time with our providers, and arriving late affects you and other patients whose appointments are after yours.  Also, if you no show three or more times for appointments you may be dismissed from the clinic at the providers discretion.     Again, thank you for choosing [facility].  Our hope is that these requests will decrease the amount of time that you wait before being seen by our physicians.       _____________________________________________________________  Should you have questions after your visit to [facility], please contact our office at [phone] between the hours of 8:30 a.m. and 4:30 p.m.  Voicemails left after 4:30 p.m. will not be returned until the following business day.  For prescription refill requests, have your pharmacy contact our office.       Resources For Cancer Patients and their Caregivers ? American Cancer Society: Can assist with transportation, wigs, general needs, runs Look Good Feel Better.        269-883-8224 ? Cancer Care: Provides financial assistance, online support groups, medication/co-pay assistance.  1-800-813-HOPE 859 618 1015) ? [facility] Assists [place] cancer patients and their families through emotional , educational and financial support.  7851129118 ? [place] DSS Where to apply for food stamps, Medicaid and utility assistance. 226 653 8740 ? [facility]: Transportation to medical appointments. 606-734-2096 ? Social Security Administration: May apply for disability if have a Stage IV cancer. 504-587-1694 (986) 715-1754 ? [employer], Disability and Transit Services: Assists with nutrition, care and transit needs. [place] Support Programs: _0 @ > Cancer Support Group  2nd Tuesday of the month 1pm-2pm, Journey Room  > Creative Journey  3rd Tuesday of the month 1130am-1pm, Journey Room  > Look Good Feel Better  1st Wednesday of the month 10am-12 noon, Journey Room (Call [place] [facility] to register [phone])

## 2016-05-22 NOTE — Progress Notes (Signed)
Chemotherapy teaching completed.  Consent signed.  Extensive teaching packet given.   

## 2016-05-22 NOTE — Progress Notes (Signed)
 [facility] [address] [place], [place] [address]   CLINIC:  Medical Oncology/Hematology  PCP:  SHAH,ASHISH, MD [address] [place] [facility] [address] [phone]   REASON FOR VISIT:  Follow-up for Stage IA (T1cN0M0) right breast invasive ductal carcinoma, ER+/PR+/HER2+ AND IgA gammopathy with M-spike  CURRENT THERAPY: Anastrozole; to begin Nerlynx AND further work-up for IgA gammopathy with M-spike.    BRIEF ONCOLOGIC HISTORY:    Stage 1 infiltrating ductal carcinoma of right female breast (HCC)   09/12/2014 Mammogram    Mass in upper outer R breast, middle third depth appears slightly larger than it was on prior exam with more irreg spiculated margins      10/02/2014 Pathology Results    biopsy with invasive ductal carcinoma high grade 1.1 cm, dcis solid type. additional R breast tissue, excision, invasive ductal high grade 0.9 cm       10/24/2014 Pathology Results    no residual invasive carcinoma, DCIS, focal, atypical ductal hyperplasia, 0/7 LN positive for metastatic carcinoma ER > 90%, PR 30%, HER 2 2+      10/24/2014 Cancer Staging    T1cN0M0      10/24/2014 Surgery    R mastectomy, T1c, N0      12/28/2014 - 11/22/2015 Chemotherapy    Taxol/herceptin weekly X 12, Herceptin every 21 days, last due in September 2017      03/11/2015 -  Anti-estrogen oral therapy    Arimidex 1 mg daily      09/12/2015 Imaging    MUGA- The left ventricular ejection fraction equals 68%.       INTERVAL HISTORY:  Ms. Bowland is 81 y.o. female here for routine follow-up for Stage I triple positive right breast cancer.   She is here today with 2 of her sisters. She is here to further discuss starting neratinib (Nerlynx) as adjuvant therapy with anastrozole for HER2 positive breast cancer.  Shew ill get chemo teaching today with Jennifer Doyle, RN-Nurse Navigator today as well.    Ms. Erb follows with Dr. Befekadu with nephrology for chronic kidney disease.  She was found to  have an M-spike on a 24-hour urine sample in 04/2016.  Therefore, we collected serum SPEP/IFE with light chains, as well as skeletal survey imaging for further evaluation.    Denies any breast complaints.  She continues to tolerate the anastrozole quite well. Denies vaginal dryness or hot flashes. Denies any headache or dizziness. Her bowels are moving regularly. Denies any blood in her stools, melena, hematuria, dysuria, or vaginal bleeding. Her appetite and energy levels are fair. Denies any new bone pains or arthralgias.   She is scheduled for mammogram of the left breast in 08/2016. Her PCP is Dr. Shah.   REVIEW OF SYSTEMS:  Review of Systems  Constitutional: Negative.  Negative for chills and fever.  HENT:  Negative.  Negative for lump/mass and nosebleeds.   Eyes: Negative.   Respiratory: Negative.  Negative for cough and shortness of breath.   Cardiovascular: Negative.  Negative for chest pain and leg swelling.  Gastrointestinal: Negative.  Negative for abdominal pain, blood in stool, constipation, diarrhea, nausea and vomiting.  Endocrine: Negative.  Negative for hot flashes.  Genitourinary: Negative.  Negative for dysuria, hematuria and vaginal bleeding.   Musculoskeletal: Negative.  Negative for arthralgias.  Skin: Negative.  Negative for rash.  Neurological: Negative.  Negative for dizziness and headaches.  Hematological: Negative.  Negative for adenopathy. Does not bruise/bleed easily.  Psychiatric/Behavioral: Negative.  Negative   for depression and sleep disturbance. The patient is not nervous/anxious.      PAST MEDICAL/SURGICAL HISTORY:  Past Medical History:  Diagnosis Date  . Anemia   . Brain cancer ([place])    right   . Chronic kidney disease   . Chronic renal disease, stage 4, severely decreased glomerular filtration rate (GFR) between 15-29 mL/min/1.73 square meter (HCC) 08/22/2015  . Diabetes mellitus without complication ([facility] [place])   . Stage 1 infiltrating ductal carcinoma  of right female breast ([facility]) 08/21/2015   ER+ PR+ HER 2 neu + (3+) T1cN0    History reviewed. No pertinent surgical history.   SOCIAL HISTORY:  Social History   Social History  . Marital status: Single    Spouse name: N/A  . Number of children: N/A  . Years of education: N/A   Occupational History  . Not on file.   Social History Main Topics  . Smoking status: Never Smoker  . Smokeless tobacco: Never Used  . Alcohol use No  . Drug use: No  . Sexual activity: Not on file   Other Topics Concern  . Not on file   Social History Narrative  . No narrative on file    FAMILY HISTORY:  History reviewed. No pertinent family history.  CURRENT MEDICATIONS:  Outpatient Encounter Prescriptions as of 05/22/2016  Medication Sig Note  . acetaminophen (TYLENOL) 500 MG tablet Take by mouth. 08/19/2015: Received from: [facility]  . amLODipine (NORVASC) 5 MG tablet  08/19/2015: Received from: [facility]  . anastrozole (ARIMIDEX) 1 MG tablet Take 1 tablet (1 mg total) by mouth daily.   Marland Kitchen aspirin 81 MG chewable tablet Chew by mouth. 08/19/2015: Received from: [facility]  . calcium-vitamin D (OSCAL WITH D) 500-200 MG-UNIT tablet Take 2 tablets by mouth.  08/19/2015: Received from: [facility]  . cloNIDine (CATAPRES) 0.3 MG tablet Take 0.3 mg by mouth 2 (two) times daily.  08/19/2015: Received from: [facility]  . docusate sodium (COLACE) 100 MG capsule Take by mouth. 08/19/2015: Received from: [facility]  . esomeprazole (NEXIUM) 40 MG capsule Take 40 mg by mouth daily.  08/19/2015: Received from: [facility]  . Insulin Degludec (TRESIBA FLEXTOUCH [place]) Inject 5 Units into the skin.   Marland Kitchen lidocaine-prilocaine (EMLA) cream Apply 1 application topically as needed. Apply to port site prior when needed for chemo, labs or port flushes.   . loperamide (LOPERAMIDE A-D) 2 MG tablet Days 1-14 take 2 tabs three times a day Days 15-56 take 2 tabs two times a day Days 57-365 take 2 tabs as needed  (max dose 16 mg/day)   . LORazepam (ATIVAN) 0.5 MG tablet Take by mouth. 08/19/2015: Received from: [facility]  . nebivolol (BYSTOLIC) 10 MG tablet Take by mouth. 08/19/2015: Received from: [facility]  . Neratinib Maleate (NERLYNX) 40 MG tablet Take 6 tablets (240 mg total) by mouth daily. Take with food.   . ondansetron (ZOFRAN) 8 MG tablet Take 1 tablet (8 mg total) by mouth every 8 (eight) hours as needed for nausea or vomiting.   Marland Kitchen PARoxetine (PAXIL) 20 MG tablet Take 20 mg by mouth daily.  08/19/2015: Received from: [facility]  . pioglitazone (ACTOS) 30 MG tablet Take 15 mg by mouth 1 day or 1 dose. 08/19/2015: Received from: External Pharmacy Received Sig:   . pravastatin (PRAVACHOL) 20 MG tablet Reported on 08/30/2015 08/19/2015: Received from: External Pharmacy  . prochlorperazine (COMPAZINE) 10 MG tablet Take 1 tablet (10 mg total) by mouth every 6 (  six) hours as needed for nausea or vomiting.   . traZODone (DESYREL) 50 MG tablet 50 mg at bedtime.  08/19/2015: Received from: External Pharmacy  . VITAMIN B COMPLEX-C PO Take by mouth. 08/19/2015: Received from: [facility]  . anti-nausea (EMETROL) solution Take by mouth. 08/19/2015: Received from: [facility]   No facility-administered encounter medications on file as of 05/22/2016.     ALLERGIES:  Allergies  Allergen Reactions  . Penicillins Other (See Comments)    Unsure of reaction  . Ace Inhibitors Cough and Other (See Comments)    Tongue swell  . Lisinopril Other (See Comments)    Tongue swell     PHYSICAL EXAM:  ECOG Performance status: 1 - Symptomatic, but largely independent.   Vitals:   05/22/16 1008  BP: (!) 172/90  Pulse: 78  Resp: 18  Temp: 98.7 F (37.1 C)   Filed Weights   05/22/16 1008  Weight: 188 lb 6.4 oz (85.5 kg)    Physical Exam  Constitutional: She is oriented to person, place, and time and well-developed, well-nourished, and in no distress.  HENT:  Head: Normocephalic.  Mouth/Throat:  Oropharynx is clear and moist. No oropharyngeal exudate.  Eyes: Conjunctivae are normal. Pupils are equal, round, and reactive to light. No scleral icterus.  Neck: Normal range of motion. Neck supple.  Cardiovascular: Normal rate, regular rhythm and normal heart sounds.   Pulmonary/Chest: Effort normal and breath sounds normal. No respiratory distress.  Abdominal: Soft. Bowel sounds are normal. There is no tenderness. There is no rebound.  Musculoskeletal: Normal range of motion. She exhibits edema (Trace LE/ankle/feet edema ).  Lymphadenopathy:    She has no cervical adenopathy.       Right: No supraclavicular adenopathy present.       Left: No supraclavicular adenopathy present.  Neurological: She is alert and oriented to person, place, and time. No cranial nerve deficit. Gait normal.  Skin: Skin is warm and dry. No rash noted.  Psychiatric: Mood, memory, affect and judgment normal.  Nursing note and vitals reviewed.    LABORATORY DATA:  I have reviewed the labs as listed.  CBC    Component Value Date/Time   WBC 5.9 05/13/2016 1134   RBC 3.29 (L) 05/13/2016 1134   HGB 10.1 (L) 05/13/2016 1134   HCT 30.9 (L) 05/13/2016 1134   PLT 269 05/13/2016 1134   MCV 93.9 05/13/2016 1134   MCH 30.7 05/13/2016 1134   MCHC 32.7 05/13/2016 1134   RDW 15.1 05/13/2016 1134   LYMPHSABS 1.1 05/13/2016 1134   MONOABS 0.3 05/13/2016 1134   EOSABS 0.1 05/13/2016 1134   BASOSABS 0.0 05/13/2016 1134   CMP Latest Ref Rng & Units 05/13/2016 04/03/2016 12/20/2015  Glucose 65 - 99 mg/dL 243(H) 225(H) 174(H)  BUN 6 - 20 mg/dL 30(H) 28(H) 34(H)  Creatinine 0.44 - 1.00 mg/dL 2.49(H) 2.50(H) 2.50(H)  Sodium 135 - 145 mmol/L 137 138 138  Potassium 3.5 - 5.1 mmol/L 4.0 4.2 4.3  Chloride 101 - 111 mmol/L 105 105 107  CO2 22 - 32 mmol/L _0 Calcium 8.9 - 10.3 mg/dL 8.8(L) 8.8(L) 8.7(L)  Total Protein 6.5 - 8.1 g/dL 7.5 7.3 7.3  Total Bilirubin 0.3 - 1.2 mg/dL 0.2(L) 0.6 0.2(L)  Alkaline Phos 38 - 126  U/L 82 80 81  AST 15 - 41 U/L _1 ALT 14 - 54 U/L 14 12(L) 15   Results for LEDIA, HANFORD (MRN [id number])   Ref. Range 05/13/2016  11:34  Total Protein ELP Latest Ref Range: 6.0 - 8.5 g/dL 6.9  Albumin ELP Latest Ref Range: 2.9 - 4.4 g/dL 2.9  Globulin, Total Latest Ref Range: 2.2 - 3.9 g/dL 4.0 (H)  A/G Ratio Latest Ref Range: 0.7 - 1.7  0.7  Alpha-1-Globulin Latest Ref Range: 0.0 - 0.4 g/dL 0.3  Alpha-2-Globulin Latest Ref Range: 0.4 - 1.0 g/dL 0.9  Beta Globulin Latest Ref Range: 0.7 - 1.3 g/dL 1.8 (H)  Gamma Globulin Latest Ref Range: 0.4 - 1.8 g/dL 1.0  M-SPIKE, % Latest Ref Range: Not Observed g/dL 0.7 (H)   Results for Eliot, Rosemary J (MRN [id number])   Ref. Range 05/13/2016 11:34  IgG (Immunoglobin G), Serum Latest Ref Range: 700 - 1,600 mg/dL 958  IgA Latest Ref Range: 64 - 422 mg/dL 1,216 (H)  IgM, Serum Latest Ref Range: 26 - 217 mg/dL 15 (L)  Kappa free light chain Latest Ref Range: 3.3 - 19.4 mg/L 68.1 (H)  Lamda free light chains Latest Ref Range: 5.7 - 26.3 mg/L 339.9 (H)  Kappa, lamda light chain ratio Latest Ref Range: 0.26 - 1.65  0.20 (L)    PENDING LABS:    DIAGNOSTIC IMAGING:  DEXA: 05/16/15     Bone survey: 05/13/16      PATHOLOGY:  Right mastectomy surgical [facility]/14 [facility])       ASSESSMENT & PLAN:   Stage IA (T1cN0M0) right breast invasive ductal carcinoma, ER+/PR+/HER2+: -s/p right mastectomy in 10/2014; completed adjuvant Taxol/Herceptin at [facility] [place]; she completed a portion of her maintenance Herceptin here at [facility]; completed therapy on 11/22/15.  -Mammogram scheduled for 08/2016. -Continue Arimidex daily; planned duration of treatment 5-10 years.  -We had lengthy discussion again today as she prepares to start neratinib (Nerlynx). Reviewed the goals & purpose of Nerlynx therapy in HER2+ breast cancers. We discussed the most common side effects of therapy, including  diarrhea. Discussed we would provide antidiarrheal prophylaxis with Imodium with the following schedule per UpToDate:   She will receive additional chemotherapy teaching today with our nurse navigator.   Bone health:  -DEXA 05/16/15 showed osteopenia with T-score -1.7. -Continue calcium/vitamin D supplementation.   IgA gammopathy with M-spike:  -IgA elevated at 1216; M-spike detectable at 0.7.  -Skeletal bone survey done and showed asymmetric/abnormal bone mineralization in right hemipelvis; radiologist favors benign etiology like Paget's disease; could further evaluate with bone scan or non-contrast CT right hip.  -A bone marrow aspiration and biopsy is indicated in all patients with an M protein ?1.5 g/dL, patients with IgA MGUS of any size, patients with an abnormal serum free light chain ratio (ie, ratio of kappa to lambda free light chains <0.26 or >1.65), and in all patients who have any abnormalities of the complete blood count (CBC), serum creatinine, serum calcium, or radiographic bone survey. This practice is supported by a study of 1271 patients with MGUS or multiple myeloma with minimal bone pain (grade 0/1) whose initial work-up included bone marrow evaluation and skeletal survey (without serum free light chain analysis) with the following results:   ?Among patients with an M-protein ?1.5 g/dL, the percentage of patients with bone marrow plasma cell infiltration >10 percent was 7.3 percent overall, but ranged from 4.7 percent to 20 percent in those with IgG and IgA isotypes, respectively.   ?For those with an IgG M-protein <0.5 g/dL, <1 percent had bone marrow plasma cell infiltration >10 percent by morphology.   ?Similarly, among   those with an M protein ?1.5 g/dL, the percentage of patients with bone lesions on skeletal survey was <2 percent, irrespective of isotype.   These results support the omission of the bone marrow evaluation in patients with IgG type MGUS with serum M protein <1.5  g/dL, a normal serum FLC ratio, and with no bone pain or clinical concern for myeloma. Bone marrow may also be deferred in older asymptomatic adults or in frail older adults with limited life expectancy in whom myeloma or a related malignancy is considered unlikely, who can be safely followed. Although data are not available to support this approach, we also often defer a bone marrow biopsy in patients with IgM MGUS with a small M protein (<1.5 g/dL), normal serum FLC ratio, with no evidence of anemia, lymphadenopathy, or organomegaly.  -We discussed the bone marrow biopsy procedure. I explained the purpose and procedure with patient and her family.  Discussed the risks and benefits of having bone marrow biopsy done with our without conscious sedation. After discussion, Ms. Blumberg decided to undergo bone marrow biopsy at [facility] under image guidance. I explained how the skin is prepped and cleansed, followed by local anesthetic with Lidocaine. There is associated pressure and possibly pain with the procedure, as well as minimal bleeding.   -She understands she will need a driver to have the bone marrow biopsy done at  under image guidance. Ms. Roell agreed to undergo bone marrow biopsy for further evaluation.       Dispo:  -[facility] in about 6-8 weeks to allow time for bone marrow biopsy procedure to be completed and results available.  -Left breast unilateral mammogram in 08/2016 as scheduled.    All questions were answered to patient's stated satisfaction. Encouraged patient to call with any new concerns or questions before her next visit to the [facility] and we can certain see her sooner, if needed.    Plan of care discussed with Dr. Zhou, who agrees with the above aforementioned.     Orders placed this encounter:  Orders Placed This Encounter  Procedures  . CT BIOPSY      Gretchen Dawson, NP [facility] [phone]   

## 2016-05-29 ENCOUNTER — Telehealth (HOSPITAL_COMMUNITY): Payer: Self-pay | Admitting: *Deleted

## 2016-05-29 NOTE — Telephone Encounter (Signed)
Called and spoke with patients sister. She states that the patient is scared and having 2nd thoughts about the procedure. Explained to sister that our recommendation for a bone marrow bx is the same as the national guidelines. She does have some concerns in her blood work that may represent a myeloma and the bone marrow bx gives the MD more information about what is going on and how to treat it. Also explained that the procedure would not be painful because she will not remember it because she will be sedated. Also welcomed patient to go to for a 2nd opinion. Sister relayed all information to patient while I was talking to her. Patient wants to wait to see MD on 06/29/16. She states at this time she is not going to do anything about it.

## 2016-06-01 ENCOUNTER — Ambulatory Visit (HOSPITAL_COMMUNITY): Payer: Medicare Other | Admitting: Oncology

## 2016-06-09 ENCOUNTER — Telehealth (HOSPITAL_COMMUNITY): Payer: Self-pay

## 2016-06-09 NOTE — Telephone Encounter (Signed)
Patient sister called and stated they received the Nerlynx on 4/2 and plan to start it on 4/3. Explained that I would let the navigator know.

## 2016-06-10 ENCOUNTER — Ambulatory Visit (HOSPITAL_COMMUNITY): Payer: Medicare Other

## 2016-06-16 ENCOUNTER — Telehealth (HOSPITAL_COMMUNITY): Payer: Self-pay

## 2016-06-16 NOTE — Telephone Encounter (Signed)
Received communication from [employer] that patient had been having some nausea. Called and spoke with sister. She states patient is using nausea medication with relief. She has not experienced any diarrhea.

## 2016-06-29 ENCOUNTER — Encounter (HOSPITAL_COMMUNITY): Payer: Self-pay

## 2016-06-29 ENCOUNTER — Encounter (HOSPITAL_COMMUNITY): Payer: Medicare Other | Attending: Oncology

## 2016-06-29 ENCOUNTER — Encounter (HOSPITAL_BASED_OUTPATIENT_CLINIC_OR_DEPARTMENT_OTHER): Payer: Medicare Other | Admitting: Oncology

## 2016-06-29 VITALS — BP 138/66 | HR 74 | Temp 98.6°F | Resp 18 | Wt 182.4 lb

## 2016-06-29 DIAGNOSIS — C50911 Malignant neoplasm of unspecified site of right female breast: Secondary | ICD-10-CM | POA: Diagnosis not present

## 2016-06-29 DIAGNOSIS — N184 Chronic kidney disease, stage 4 (severe): Secondary | ICD-10-CM

## 2016-06-29 DIAGNOSIS — Z95828 Presence of other vascular implants and grafts: Secondary | ICD-10-CM

## 2016-06-29 DIAGNOSIS — C50411 Malignant neoplasm of upper-outer quadrant of right female breast: Secondary | ICD-10-CM

## 2016-06-29 MED ORDER — HEPARIN SOD (PORK) LOCK FLUSH 100 UNIT/ML IV SOLN
INTRAVENOUS | Status: AC
  Filled 2016-06-29: qty 5

## 2016-06-29 MED ORDER — HEPARIN SOD (PORK) LOCK FLUSH 100 UNIT/ML IV SOLN
500.0000 [IU] | Freq: Once | INTRAVENOUS | Status: AC
  Administered 2016-06-29: 500 [IU] via INTRAVENOUS

## 2016-06-29 MED ORDER — SODIUM CHLORIDE 0.9% FLUSH
10.0000 mL | Freq: Once | INTRAVENOUS | Status: AC
  Administered 2016-06-29: 10 mL via INTRAVENOUS

## 2016-06-29 NOTE — Patient Instructions (Signed)
[facility] at [facility] Discharge Instructions  RECOMMENDATIONS MADE BY THE CONSULTANT AND ANY TEST RESULTS WILL BE SENT TO YOUR REFERRING PHYSICIAN.  Port flush done today. Port flush needed every 8 weeks. Return as scheduled.  Thank you for choosing [facility] at [facility] to provide your oncology and hematology care.  To afford each patient quality time with our provider, please arrive at least 15 minutes before your scheduled appointment time.    If you have a lab appointment with the [facility] please come in thru the  Main Entrance and check in at the main information desk  You need to re-schedule your appointment should you arrive 10 or more minutes late.  We strive to give you quality time with our providers, and arriving late affects you and other patients whose appointments are after yours.  Also, if you no show three or more times for appointments you may be dismissed from the clinic at the providers discretion.     Again, thank you for choosing [facility].  Our hope is that these requests will decrease the amount of time that you wait before being seen by our physicians.       _____________________________________________________________  Should you have questions after your visit to [facility], please contact our office at [phone] between the hours of 8:30 a.m. and 4:30 p.m.  Voicemails left after 4:30 p.m. will not be returned until the following business day.  For prescription refill requests, have your pharmacy contact our office.       Resources For Cancer Patients and their Caregivers ? American Cancer Society: Can assist with transportation, wigs, general needs, runs Look Good Feel Better.        720-045-5642 ? Cancer Care: Provides financial assistance, online support groups, medication/co-pay assistance.  1-800-813-HOPE 310-527-1553) ? [facility] Assists  [place] cancer patients and their families through emotional , educational and financial support.  (254)636-8818 ? [place] DSS Where to apply for food stamps, Medicaid and utility assistance. 609-143-6890 ? [facility]: Transportation to medical appointments. 4168130172 ? Social Security Administration: May apply for disability if have a Stage IV cancer. 920-611-3314 972 447 7215 ? [employer], Disability and Transit Services: Assists with nutrition, care and transit needs. [place] Support Programs: @10RELATIVEDAYS @ > Cancer Support Group  2nd Tuesday of the month 1pm-2pm, Journey Room  > Creative Journey  3rd Tuesday of the month 1130am-1pm, Journey Room  > Look Good Feel Better  1st Wednesday of the month 10am-12 noon, Journey Room (Call [facility] to register [phone])

## 2016-06-29 NOTE — Progress Notes (Signed)
[facility]  Progress Note  CHIEF COMPLAINTS:    Stage 1 infiltrating ductal carcinoma of right female breast ([place])   09/12/2014 Mammogram    Mass in upper outer R breast, middle third depth appears slightly larger than it was on prior exam with more irreg spiculated margins      10/02/2014 Pathology Results    biopsy with invasive ductal carcinoma high grade 1.1 cm, dcis solid type. additional R breast tissue, excision, invasive ductal high grade 0.9 cm       10/24/2014 Pathology Results    no residual invasive carcinoma, DCIS, focal, atypical ductal hyperplasia, 0/7 LN positive for metastatic carcinoma ER > 90%, PR 30%, HER 2 2+      10/24/2014 Cancer Staging    [id number]      10/24/2014 Surgery    R mastectomy, T1c, N0      12/28/2014 - 11/22/2015 Chemotherapy    Taxol/herceptin weekly X 12, Herceptin every 21 days, last due in September 2017      03/11/2015 -  Anti-estrogen oral therapy    Arimidex 1 mg daily      09/12/2015 Imaging    MUGA- The left ventricular ejection fraction equals 68%.      HISTORY OF PRESENTING ILLNESS:  Carly Wood 81 y.o. female is here for ongoing care for a stage I ER+ PR+ HER 2 + carcinoma of the R breast. She underwent a mastectomy August 2016. She is currently on herceptin/arimidex. Patient's malignancy was in the upper outer quadrant of the R breast, she had undergone a biopsy previously of this area but it was benign. Repeat mammogram on 09/12/2014 showed growth in the spiculated mass and repeat biopsy was recommended.   She is doing well today. Since she was here last she has been having some anxiety and nervous episodes. She says she feels like she is shaking a lot internally, but not physically. No new stressors in her life. She does take anti-anxiety medications. She has been struggling swallowing her pills and food. She is pureeing her food to get it down. She can swallow liquids. This started a couple of weeks ago. It feels  like it is getting stuck at the top of her throat. She has never had an EGD. She has lost 6 lbs. She is on day 21 of nerlynx. Her sister says she had some nausea, which was relieved with compazine. Denies diarrhea, vomiting, or any other concerns.   MEDICAL HISTORY:  Past Medical History:  Diagnosis Date  . Anemia   . Brain cancer ([place])    right   . Chronic kidney disease   . Chronic renal disease, stage 4, severely decreased glomerular filtration rate (GFR) between 15-29 mL/min/1.73 square meter ([facility]) 08/22/2015  . Diabetes mellitus without complication ([place])   . Stage 1 infiltrating ductal carcinoma of right female breast ([place]) 08/21/2015   ER+ PR+ HER 2 neu + (3+) T1cN0     SURGICAL HISTORY: No past surgical history on file.  SOCIAL HISTORY: Social History   Social History  . Marital status: Single    Spouse name: N/A  . Number of children: N/A  . Years of education: N/A   Occupational History  . Not on file.   Social History Main Topics  . Smoking status: Never Smoker  . Smokeless tobacco: Never Used  . Alcohol use No  . Drug use: No  . Sexual activity: Not on file   Other Topics Concern  . Not  on file   Social History Narrative  . No narrative on file   Carly Wood, the patient, never married; says she never wanted to. She remarks that this was a choice she made; "not that I didn't have the opportunities, I just made the choice not to do it." NO tobacco, cigarettes or chewing tobacco. No ETOH  The 3 sisters live together. Carly Wood does all the cooking.  She has nieces and nephews; one teaches school in [place]. Carly Wood is her oldest brother; he was a principal. Has retired. Carly Wood has a wife who was a Pharmacist, hospital; they have 3 children, including a son who graduated from [employer], and a daughter who graduated from [employer] who is a Sport and exercise psychologist, and another daughter who has a PhD in education. 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  The patient, Carly Wood, was a good Ship broker; graduated high school and college. 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 Taught in [place], [place].  She notes she had hobbies; used to do artwork. Drawing pictures, mostly flowers, houses. Pen and pencil, colored pencils.  FAMILY HISTORY: No family history on file.  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  Father was 70 when he died; heart disease. Mother was 20 when she died. Diabetic, with congestive heart failure. Had a stroke as well. Sister Carly Wood was caregiver for their mother for 10 years after her stroke. Her sister Carly Wood has multiple myeloma and also had a CNS lymphoma, and notes that she is on Revlamid. Carly Wood is 16 years out from her brain surgery.  7 brothers and sisters total; one is deceased. 6 siblings left. 1 that passed died of prostate cancer. "He lasted 12 years."  ALLERGIES:  is allergic to penicillins; ace inhibitors; and lisinopril.  MEDICATIONS:  Current Outpatient Prescriptions  Medication Sig Dispense Refill  . acetaminophen (TYLENOL) 500 MG tablet Take by mouth.    Marland Kitchen amLODipine (NORVASC) 5 MG tablet     . anastrozole (ARIMIDEX) 1 MG tablet Take 1 tablet (1 mg total) by mouth daily. 90 tablet 3  . anti-nausea (EMETROL) solution Take by mouth.    Marland Kitchen aspirin 81 MG chewable tablet Chew by mouth.    . calcium-vitamin D (OSCAL WITH D) 500-200 MG-UNIT tablet Take 2 tablets by mouth.     . cloNIDine (CATAPRES) 0.3 MG tablet Take 0.3 mg by mouth 2 (two) times daily.     Marland Kitchen docusate sodium (COLACE) 100 MG capsule Take by mouth.    . esomeprazole (NEXIUM) 40 MG capsule Take 40 mg by mouth daily.     . Insulin  Degludec (TRESIBA FLEXTOUCH [place]) Inject 5 Units into the skin.    Marland Kitchen lidocaine-prilocaine (EMLA) cream Apply 1 application topically as needed. Apply to port site prior when needed for chemo, labs or port flushes. 30 g 2  . loperamide (LOPERAMIDE A-D) 2 MG tablet Days 1-14 take 2 tabs three times a day Days 15-56 take 2 tabs two times a day Days 57-365 take 2 tabs as needed (max dose 16 mg/day) 140 tablet 3  . LORazepam (ATIVAN) 0.5 MG tablet Take by  mouth.    . nebivolol (BYSTOLIC) 10 MG tablet Take by mouth.    . Neratinib Maleate (NERLYNX) 40 MG tablet Take 6 tablets (240 mg total) by mouth daily. Take with food. 180 tablet 0  . ondansetron (ZOFRAN) 8 MG tablet Take 1 tablet (8 mg total) by mouth every 8 (eight) hours as needed for nausea or vomiting. 30 tablet 2  . PARoxetine (PAXIL) 20 MG tablet Take 20 mg by mouth daily.     . pioglitazone (ACTOS) 30 MG tablet Take 15 mg by mouth 1 day or 1 dose.    . pravastatin (PRAVACHOL) 20 MG tablet Reported on 08/30/2015    . prochlorperazine (COMPAZINE) 10 MG tablet Take 1 tablet (10 mg total) by mouth every 6 (six) hours as needed for nausea or vomiting. 30 tablet 2  . traZODone (DESYREL) 50 MG tablet 50 mg at bedtime.     Marland Kitchen VITAMIN B COMPLEX-C PO Take by mouth.     No current facility-administered medications for this visit.    Review of Systems  Constitutional: Positive for weight loss.  HENT:       Dysphagia  Eyes: Negative.   Respiratory: Negative.   Cardiovascular: Negative.   Gastrointestinal: Positive for nausea. Negative for diarrhea and vomiting.  Genitourinary: Negative.   Musculoskeletal: Negative.   Skin: Negative.   Neurological: Negative.   Endo/Heme/Allergies: Negative.   Psychiatric/Behavioral: The patient is nervous/anxious.   All other systems reviewed and are negative. 14 point ROS was done and is otherwise as detailed above or in HPI  PHYSICAL EXAMINATION: ECOG PERFORMANCE STATUS: 1 - Symptomatic but completely  ambulatory  Vitals:   06/29/16 1038  BP: 138/66  Pulse: 74  Resp: 18  Temp: 98.6 F (37 C)   Filed Weights   06/29/16 1038  Weight: 182 lb 6.4 oz (82.7 kg)   Physical Exam  Constitutional: She is oriented to person, place, and time and well-developed, well-nourished, and in no distress.  Obese, in wheelchair.  HENT:  Head: Normocephalic and atraumatic.  Nose: Nose normal.  Mouth/Throat: Oropharynx is clear and moist. No oropharyngeal exudate.  Eyes: Conjunctivae and EOM are normal. Pupils are equal, round, and reactive to light. Right eye exhibits no discharge. Left eye exhibits no discharge. No scleral icterus.  Neck: Normal range of motion. Neck supple. No tracheal deviation present. No thyromegaly present.  Cardiovascular: Normal rate, regular rhythm and normal heart sounds.  Exam reveals no gallop and no friction rub.   No murmur heard. Pulmonary/Chest: Effort normal and breath sounds normal. She has no wheezes. She has no rales.  Abdominal: Soft. Bowel sounds are normal. She exhibits no distension and no mass. There is no tenderness. There is no rebound and no guarding.  Musculoskeletal: Normal range of motion. She exhibits no edema.  Lymphadenopathy:    She has no cervical adenopathy.  Neurological: She is alert and oriented to person, place, and time. No cranial nerve deficit.  Gait slow  Skin: Skin is warm and dry. No rash noted.  Psychiatric: Mood, memory, affect and judgment normal.  Nursing note and vitals reviewed.  LABORATORY DATA:  I have reviewed the data as listed Lab Results  Component Value Date   WBC 5.9 05/13/2016   HGB 10.1 (L) 05/13/2016   HCT 30.9 (L) 05/13/2016   MCV 93.9 05/13/2016   PLT 269 05/13/2016   CMP     Component Value Date/Time   NA 137 05/13/2016 1134   K 4.0 05/13/2016 1134  CL 105 05/13/2016 1134   CO2 24 05/13/2016 1134   GLUCOSE 243 (H) 05/13/2016 1134   BUN 30 (H) 05/13/2016 1134   CREATININE 2.49 (H) 05/13/2016 1134    CALCIUM 8.8 (L) 05/13/2016 1134   PROT 7.5 05/13/2016 1134   ALBUMIN 3.1 (L) 05/13/2016 1134   AST 22 05/13/2016 1134   ALT 14 05/13/2016 1134   ALKPHOS 82 05/13/2016 1134   BILITOT 0.2 (L) 05/13/2016 1134   GFRNONAA 17 (L) 05/13/2016 1134   GFRAA 20 (L) 05/13/2016 1134   RADIOGRAPHIC STUDIES: I have personally reviewed the radiological images as listed and agreed with the findings in the report.  DG Bone Survey Met 05/13/2016 IMPRESSION: 1. Asymmetric and abnormal bone mineralization in the right hemipelvis about the acetabulum and inferior pubic ramus, but favor benign etiology such as Paget's disease. Nuclear Medicine Whole-body Bone Scan or noncontrast CT Right Hip correlation may be confirmatory. 2. Normal bone mineralization elsewhere throughout the visible skeleton. Mild L1 compression fracture is new since 2009 but appears chronic and benign. 3. Calcified aortic atherosclerosis. Fibroid uterus. Cholelithiasis.  ASSESSMENT & PLAN:  Stage I ER+ PR+ HER 2 + carcinoma of R breast, upper outer quadrant R mastectomy Arimidex Herceptin Anemia CKD, Stage III DEXA with osteopenia 05/2015 Last 2-D echo 05/2015 Lymphedema  L breast screening mammogram due 08/2016 HTN IgA gammopathy with M-spike:  -IgA elevated at 1216; M-spike detectable at 0.7.   She is to continue arimidex, calcium and vitamin D.   Tolerating nerylynx well.   Stat referral to GI for an EGD for evaluation of her dysphagia. She would like for it to be in [place].   Regarding her IgA gammopathy, patient has refused bone marrow biopsy for further workup. She states she discussed it with her nephrologist who told her he will monitor her labs instead.  I have recommended that she see Dr. Manuella Ghazi about changes to her anti-anxiety medications since she seems to be having worsening anxiety.   I have discussed all of my recommendations with her sister who is her primary caretaker and lives with her.  She will return for  follow up in 4 weeks.   All questions were answered. The patient knows to call the clinic with any problems, questions or concerns.  This document serves as a record of services personally performed by Twana First, MD. It was created on her behalf by Martinique Casey, a trained medical scribe. The creation of this record is based on the scribe's personal observations and the provider's statements to them. This document has been checked and approved by the attending provider.  I have reviewed the above documentation for accuracy and completeness and I agree with the above.  This note was electronically signed.  Martinique M Casey  06/29/2016 10:39 AM

## 2016-06-29 NOTE — Patient Instructions (Addendum)
[facility] at [facility] Discharge Instructions  RECOMMENDATIONS MADE BY THE CONSULTANT AND ANY TEST RESULTS WILL BE SENT TO YOUR REFERRING PHYSICIAN.  You were seen today by Dr. Twana First Your port was flushed today, be sure to get it flushed every 6-8 weeks We will refer you to GI specialist   Thank you for choosing [facility] at [facility] to provide your oncology and hematology care.  To afford each patient quality time with our provider, please arrive at least 15 minutes before your scheduled appointment time.    If you have a lab appointment with the [facility] please come in thru the  Main Entrance and check in at the main information desk  You need to re-schedule your appointment should you arrive 10 or more minutes late.  We strive to give you quality time with our providers, and arriving late affects you and other patients whose appointments are after yours.  Also, if you no show three or more times for appointments you may be dismissed from the clinic at the providers discretion.     Again, thank you for choosing [facility].  Our hope is that these requests will decrease the amount of time that you wait before being seen by our physicians.       _____________________________________________________________  Should you have questions after your visit to [facility], please contact our office at [phone] between the hours of 8:30 a.m. and 4:30 p.m.  Voicemails left after 4:30 p.m. will not be returned until the following business day.  For prescription refill requests, have your pharmacy contact our office.       Resources For Cancer Patients and their Caregivers ? American Cancer Society: Can assist with transportation, wigs, general needs, runs Look Good Feel Better.        725-466-3425 ? Cancer Care: Provides financial assistance, online support groups, medication/co-pay assistance.   1-800-813-HOPE 806-028-7051) ? [place] Assists [place] cancer patients and their families through emotional , educational and financial support.  (260)545-3623 ? [place] DSS Where to apply for food stamps, Medicaid and utility assistance. (321) 246-8646 ? [facility]: Transportation to medical appointments. 605-336-1896 ? Social Security Administration: May apply for disability if have a Stage IV cancer. (418) 863-1892 940-563-7371 ? [employer], Disability and Transit Services: Assists with nutrition, care and transit needs. [place] Support Programs: @10RELATIVEDAYS @ > Cancer Support Group  2nd Tuesday of the month 1pm-2pm, Journey Room  > Creative Journey  3rd Tuesday of the month 1130am-1pm, Journey Room  > Look Good Feel Better  1st Wednesday of the month 10am-12 noon, Journey Room (Call [place] to register [phone])

## 2016-07-01 ENCOUNTER — Other Ambulatory Visit (HOSPITAL_COMMUNITY): Payer: Self-pay | Admitting: Adult Health

## 2016-07-01 DIAGNOSIS — C50919 Malignant neoplasm of unspecified site of unspecified female breast: Secondary | ICD-10-CM

## 2016-07-01 DIAGNOSIS — C50911 Malignant neoplasm of unspecified site of right female breast: Secondary | ICD-10-CM

## 2016-07-02 ENCOUNTER — Encounter: Payer: Self-pay | Admitting: Internal Medicine

## 2016-07-21 ENCOUNTER — Ambulatory Visit: Payer: Medicare Other | Admitting: Gastroenterology

## 2016-07-27 ENCOUNTER — Ambulatory Visit (HOSPITAL_COMMUNITY): Payer: Medicare Other

## 2016-07-27 ENCOUNTER — Encounter (HOSPITAL_COMMUNITY): Payer: Medicare Other | Attending: Oncology

## 2016-07-27 DIAGNOSIS — C50911 Malignant neoplasm of unspecified site of right female breast: Secondary | ICD-10-CM | POA: Diagnosis not present

## 2016-07-27 LAB — COMPREHENSIVE METABOLIC PANEL
ALT: 11 U/L — AB (ref 14–54)
AST: 19 U/L (ref 15–41)
Albumin: 3.1 g/dL — ABNORMAL LOW (ref 3.5–5.0)
Alkaline Phosphatase: 72 U/L (ref 38–126)
Anion gap: 8 (ref 5–15)
BUN: 26 mg/dL — AB (ref 6–20)
CHLORIDE: 109 mmol/L (ref 101–111)
CO2: 23 mmol/L (ref 22–32)
Calcium: 8.7 mg/dL — ABNORMAL LOW (ref 8.9–10.3)
Creatinine, Ser: 2.89 mg/dL — ABNORMAL HIGH (ref 0.44–1.00)
GFR, EST AFRICAN AMERICAN: 17 mL/min — AB (ref >60)
GFR, EST NON AFRICAN AMERICAN: 15 mL/min — AB (ref >60)
Glucose, Bld: 103 mg/dL — ABNORMAL HIGH (ref 65–99)
POTASSIUM: 4.5 mmol/L (ref 3.5–5.1)
SODIUM: 140 mmol/L (ref 135–145)
Total Bilirubin: 0.4 mg/dL (ref 0.3–1.2)
Total Protein: 7.2 g/dL (ref 6.5–8.1)

## 2016-07-27 LAB — CBC WITH DIFFERENTIAL/PLATELET
BASOS ABS: 0 10*3/uL (ref 0.0–0.1)
Basophils Relative: 0 %
EOS ABS: 0.2 10*3/uL (ref 0.0–0.7)
EOS PCT: 3 %
HCT: 30.1 % — ABNORMAL LOW (ref 36.0–46.0)
Hemoglobin: 9.9 g/dL — ABNORMAL LOW (ref 12.0–15.0)
Lymphocytes Relative: 13 %
Lymphs Abs: 1.1 10*3/uL (ref 0.7–4.0)
MCH: 31.2 pg (ref 26.0–34.0)
MCHC: 32.9 g/dL (ref 30.0–36.0)
MCV: 95 fL (ref 78.0–100.0)
Monocytes Absolute: 0.4 10*3/uL (ref 0.1–1.0)
Monocytes Relative: 5 %
Neutro Abs: 6.2 10*3/uL (ref 1.7–7.7)
Neutrophils Relative %: 79 %
PLATELETS: 279 10*3/uL (ref 150–400)
RBC: 3.17 MIL/uL — AB (ref 3.87–5.11)
RDW: 16.1 % — AB (ref 11.5–15.5)
WBC: 7.9 10*3/uL (ref 4.0–10.5)

## 2016-07-30 ENCOUNTER — Other Ambulatory Visit (HOSPITAL_COMMUNITY): Payer: Self-pay | Admitting: Adult Health

## 2016-07-30 DIAGNOSIS — C50911 Malignant neoplasm of unspecified site of right female breast: Secondary | ICD-10-CM

## 2016-07-30 DIAGNOSIS — C50919 Malignant neoplasm of unspecified site of unspecified female breast: Secondary | ICD-10-CM

## 2016-07-31 ENCOUNTER — Encounter: Payer: Self-pay | Admitting: Gastroenterology

## 2016-07-31 ENCOUNTER — Ambulatory Visit (INDEPENDENT_AMBULATORY_CARE_PROVIDER_SITE_OTHER): Payer: Medicare Other | Admitting: Gastroenterology

## 2016-07-31 ENCOUNTER — Other Ambulatory Visit: Payer: Self-pay

## 2016-07-31 DIAGNOSIS — R1310 Dysphagia, unspecified: Secondary | ICD-10-CM

## 2016-07-31 DIAGNOSIS — R634 Abnormal weight loss: Secondary | ICD-10-CM | POA: Insufficient documentation

## 2016-07-31 DIAGNOSIS — K219 Gastro-esophageal reflux disease without esophagitis: Secondary | ICD-10-CM | POA: Diagnosis not present

## 2016-07-31 NOTE — Assessment & Plan Note (Addendum)
81 year old female with 4-6 week history of difficulty swallowing. Issue swallowing solid food and pills. Initially described to her oncologist that she felt like food was sticking in her throat. Patient has not been a hard time describing her symptoms today. She has been evaluated by speech therapy and based on modified barium swallow studies showed mild to moderate oropharyngeal dysphagia as outlined above. She would not swallow the barium tablet. She is tolerating pured diet. She's had nausea and increased heartburn. Would recommend further evaluation with upper endoscopy with possible esophageal dilation as appropriate. Explained to patient today as well as her sister that if the upper endoscopy is unremarkable, patient may require further evaluation possibly by a neurologist for acute onset swallowing issues. I have discussed the risks, alternatives, benefits with regards to but not limited to the risk of reaction to medication, bleeding, infection, perforation and the patient is agreeable to proceed. Written consent to be obtained.  Patient also has a history of chronic stable anemia likely related to anemia of chronic disease.

## 2016-07-31 NOTE — Progress Notes (Signed)
Primary Care Physician:  Monico Blitz, MD Referring Provider: Dr. Twana First Primary Gastroenterologist:  Garfield Cornea, MD   Chief Complaint  Patient presents with  . Dysphagia    food/pills, new onset x 1 month    HPI:  Carly Wood is a 81 y.o. female here at the request of Dr. Talbert Cage for further evaluation of new onset dysphagia. Patient's past medical history significant for breast cancer currently on Herceptin/aremidex.  She has anxiety, diabetes, chronic renal disease.   Patient was seen by speech therapy (06/2016) and had a modified barium swallow study in conjunction with radiology. She was found have mild to moderate oropharyngeal dysphagia with reduced bolus formation, piecemeal deglutition, prolonged oral transit time, spillover to the pharynx secondary to reduced oral motor strength/R a.m. Patient had aspiration of thin liquids with cough response. Patient chewed regular solids but spit out reporting that she can swallow it. She spit out the barium tablet as well. No esophageal abnormality observed during esophageal sweep. Recommended pured diet with nectar thick liquids consider further neurological workup given report sudden onset dysphagia.  Patient presents with a sister today. Her sister provides most of the history. Apparently in early April patient started to have difficulty swallowing solid foods and pills. They are both quite difficult historians. But what I can gather is that over the past couple of years she has had increased issues with heartburn and it was just more recently that it was noted that she predominantly would spit out solid foods and pills. No noted issues with soft foods or liquids. No reported abdominal pain. Apparently her bowel function is been unremarkable. Patient denies blood in the stool or melena.  According to affect her baseline weight appears to run between 185 to 190 pounds. Since March she's lost about 9 pounds.  Patient and sister had reported  to Dr. Talbert Cage that they had started pureing her food 2 weeks after the symptoms started. Patient did end up in the ED and apparently saw speech therapy on 07/03/2016 with the findings as outlined above.  At this time patient is consuming pured diet. They place her pills within her pured diet she's take some that way. There's been no coughing fits with swallowing. They're supplementing the diet with Glucerna. I inquired a couple of different times if there have been any concerns about memory issues or cognitive decline none reported.  Patient reports last colonoscopy about 10 years ago in the [facility] and was normal. No egd  Current Outpatient Prescriptions  Medication Sig Dispense Refill  . acetaminophen (TYLENOL) 500 MG tablet Take 500 mg by mouth every 6 (six) hours as needed.     Marland Kitchen amLODipine (NORVASC) 5 MG tablet Take 5 mg by mouth daily.     Marland Kitchen anastrozole (ARIMIDEX) 1 MG tablet Take 1 tablet (1 mg total) by mouth daily. 90 tablet 3  . aspirin 81 MG chewable tablet Chew by mouth.    . calcium-vitamin D (OSCAL WITH D) 500-200 MG-UNIT tablet Take 2 tablets by mouth.     . cloNIDine (CATAPRES) 0.3 MG tablet Take 0.3 mg by mouth 2 (two) times daily.     Marland Kitchen esomeprazole (NEXIUM) 40 MG capsule Take 40 mg by mouth daily.     . Insulin Degludec (TRESIBA FLEXTOUCH [place]) Inject 5 Units into the skin.    Marland Kitchen lidocaine-prilocaine (EMLA) cream Apply 1 application topically as needed. Apply to port site prior when needed for chemo, labs or port flushes. 30 g 2  .  nebivolol (BYSTOLIC) 10 MG tablet Take 10 mg by mouth daily.     . NERLYNX 40 MG tablet TAKE 6 TABLETS (240MG) BY MOUTH ONE TIME DAILY WITH FOOD. 180 tablet 0  . ondansetron (ZOFRAN) 8 MG tablet Take 1 tablet (8 mg total) by mouth every 8 (eight) hours as needed for nausea or vomiting. 30 tablet 2  . PARoxetine (PAXIL) 20 MG tablet Take 20 mg by mouth daily.     . pioglitazone (ACTOS) 30 MG tablet Take 15 mg by mouth 1 day or 1 dose.    . pravastatin  (PRAVACHOL) 20 MG tablet Reported on 08/30/2015    . prochlorperazine (COMPAZINE) 10 MG tablet Take 1 tablet (10 mg total) by mouth every 6 (six) hours as needed for nausea or vomiting. 30 tablet 2  . traZODone (DESYREL) 50 MG tablet Take 100 mg by mouth at bedtime.     Marland Kitchen VITAMIN B COMPLEX-C PO Take by mouth.     No current facility-administered medications for this visit.     Allergies as of 07/31/2016 - Review Complete 07/31/2016  Allergen Reaction Noted  . Penicillins Other (See Comments) 08/19/2015  . Ace inhibitors Cough and Other (See Comments) 08/19/2015  . Lisinopril Other (See Comments) 08/19/2015    Past Medical History:  Diagnosis Date  . Anemia   . Anxiety   . Chronic kidney disease   . Chronic renal disease, stage 4, severely decreased glomerular filtration rate (GFR) between 15-29 mL/min/1.73 square meter (HCC) 08/22/2015  . Depression   . Diabetes mellitus without complication ([place])   . Hypertension   . Stage 1 infiltrating ductal carcinoma of right female breast ([place]) 08/21/2015   ER+ PR+ HER 2 neu + (3+) T1cN0     Past Surgical History:  Procedure Laterality Date  . MASTECTOMY, PARTIAL    . RETINAL DETACHMENT SURGERY Right     Family History  Problem Relation Age of Onset  . Multiple myeloma Sister   . Brain cancer Sister   . Dementia Mother        died at 77  . Stroke Mother   . Heart failure Mother   . Diabetes Mother   . Heart disease Father   . Prostate cancer Brother   . Colon cancer Neg Hx     Social History   Social History  . Marital status: Single    Spouse name: N/A  . Number of children: N/A  . Years of education: N/A   Occupational History  . Not on file.   Social History Main Topics  . Smoking status: Never Smoker  . Smokeless tobacco: Never Used  . Alcohol use No  . Drug use: No  . Sexual activity: Not on file   Other Topics Concern  . Not on file   Social History Narrative  . No narrative on file       ROS:  General: Negative for anorexia,  fever, chills, fatigue, weakness.See history of present illness Eyes: Negative for vision changes.  ENT: Negative for hoarseness, , nasal congestion. See history of present illness CV: Negative for chest pain, angina, palpitations, dyspnea on exertion, peripheral edema.  Respiratory: Negative for dyspnea at rest, dyspnea on exertion, cough, sputum, wheezing.  GI: See history of present illness. GU:  Negative for dysuria, hematuria, urinary incontinence, urinary frequency, nocturnal urination.  MS: Negative for joint pain, low back pain.  Derm: Negative for rash or itching.  Neuro: Negative for weakness, abnormal sensation, seizure, frequent headaches, memory  loss, confusion.  Psych: Negative for anxiety, depression, suicidal ideation, hallucinations.  Endo: See history of present illness Heme: Negative for bruising or bleeding. Allergy: Negative for rash or hives.    Physical Examination:  BP 135/77   Pulse 75   Temp 97.7 F (36.5 C) (Oral)   Ht 5' 3.5" (1.613 m)   Wt 177 lb 3.2 oz (80.4 kg)   BMI 30.90 kg/m    General: Well-nourished, well-developed in no acute distress.  Head: Normocephalic, atraumatic.   Eyes: Conjunctiva pink, no icterus. Mouth: Oropharyngeal mucosa moist and pink , no lesions erythema or exudate. Neck: Supple without thyromegaly, masses, or lymphadenopathy.  Lungs: Clear to auscultation bilaterally.  Heart: Regular rate and rhythm, no murmurs rubs or gallops.  Abdomen: Bowel sounds are normal, nontender, nondistended, no hepatosplenomegaly or masses, no abdominal bruits or    hernia , no rebound or guarding.   Rectal: Not performed Extremities: No lower extremity edema. No clubbing or deformities.  Neuro: Alert and oriented x 4 , grossly normal neurologically.  Skin: Warm and dry, no rash or jaundice.   Psych: Alert and cooperative, normal mood and affect.  Labs: Lab Results  Component Value Date   CREATININE  2.89 (H) 07/27/2016   BUN 26 (H) 07/27/2016   NA 140 07/27/2016   K 4.5 07/27/2016   CL 109 07/27/2016   CO2 23 07/27/2016   Lab Results  Component Value Date   ALT 11 (L) 07/27/2016   AST 19 07/27/2016   ALKPHOS 72 07/27/2016   BILITOT 0.4 07/27/2016   Lab Results  Component Value Date   WBC 7.9 07/27/2016   HGB 9.9 (L) 07/27/2016   HCT 30.1 (L) 07/27/2016   MCV 95.0 07/27/2016   PLT 279 07/27/2016   No results found for: IRON, TIBC, FERRITIN No results found for: VITAMINB12 No results found for: FOLATE   Imaging Studies: No results found.

## 2016-07-31 NOTE — Patient Instructions (Signed)
1. Upper endoscopy to evaluate your swallowing issues. See separate instructions.  2. Continue pureed diet for now.

## 2016-08-04 ENCOUNTER — Telehealth: Payer: Self-pay | Admitting: Internal Medicine

## 2016-08-04 NOTE — Progress Notes (Signed)
cc'ed to pcp °

## 2016-08-04 NOTE — Telephone Encounter (Signed)
Pt's sister Deneise Lever) called office. EGD/ED with RMR rescheduled to 08/07/16 at 2:45pm. Instructions given on phone to pt's sister. Advised for pt to start clear liquids after 6:00pm 08/06/16 and she may have clear liquids until 10:45 am 08/07/16. NPO after 10:45am on 08/07/16. No diabetic medications the morning of 08/07/16. Called and informed Endo scheduler of procedure being rescheduled.

## 2016-08-04 NOTE — Telephone Encounter (Signed)
Endo scheduler called and wants pt to arrive 08/07/16 at 1:30pm for 2:30pm procedure. Tried to call pt's sister Carly Wood), no answer, [facility] for pt to arrive at 1:30pm.

## 2016-08-04 NOTE — Telephone Encounter (Signed)
Noted  

## 2016-08-04 NOTE — Telephone Encounter (Signed)
A lady called for patient to reschedule her EGD with RMR. She is having problems with her phone and said that she would call back in a few minutes since she can't receive outside calls.

## 2016-08-06 ENCOUNTER — Telehealth: Payer: Self-pay

## 2016-08-06 NOTE — Telephone Encounter (Signed)
Tried to call pt to see if she could arrive at hospital tomorrow at 11:15am for procedure (d/t a cancellation). Phone has static and unable to complete call. Endo Scheduler called office and was informed.

## 2016-08-06 NOTE — Telephone Encounter (Signed)
Endo Scheduler called. Another procedure has been added for tomorrow, therefore pt will need to arrive at previous time. Will disregard if pt/family calls back.

## 2016-08-06 NOTE — Telephone Encounter (Signed)
Opened in error

## 2016-08-06 NOTE — Progress Notes (Signed)
Attempted x 2 to review endoscopy procedure for Friday.  No answer.  Message left on voice mail with new arrival time of 1400.

## 2016-08-07 ENCOUNTER — Encounter (HOSPITAL_COMMUNITY)
Admission: RE | Disposition: A | Discharge status: Home / Self Care | Payer: Self-pay | Source: Ambulatory Visit | Attending: Internal Medicine

## 2016-08-07 ENCOUNTER — Ambulatory Visit (HOSPITAL_COMMUNITY)
Admission: RE | Admit: 2016-08-07 | Discharge: 2016-08-07 | Disposition: A | Discharge status: Home / Self Care | Payer: Medicare Other | Source: Ambulatory Visit | Attending: Internal Medicine | Admitting: Internal Medicine

## 2016-08-07 ENCOUNTER — Encounter (HOSPITAL_COMMUNITY): Payer: Self-pay | Admitting: *Deleted

## 2016-08-07 DIAGNOSIS — Z8042 Family history of malignant neoplasm of prostate: Secondary | ICD-10-CM | POA: Insufficient documentation

## 2016-08-07 DIAGNOSIS — Z808 Family history of malignant neoplasm of other organs or systems: Secondary | ICD-10-CM | POA: Insufficient documentation

## 2016-08-07 DIAGNOSIS — K449 Diaphragmatic hernia without obstruction or gangrene: Secondary | ICD-10-CM | POA: Diagnosis not present

## 2016-08-07 DIAGNOSIS — Z794 Long term (current) use of insulin: Secondary | ICD-10-CM | POA: Insufficient documentation

## 2016-08-07 DIAGNOSIS — R1312 Dysphagia, oropharyngeal phase: Secondary | ICD-10-CM | POA: Diagnosis present

## 2016-08-07 DIAGNOSIS — I129 Hypertensive chronic kidney disease with stage 1 through stage 4 chronic kidney disease, or unspecified chronic kidney disease: Secondary | ICD-10-CM | POA: Insufficient documentation

## 2016-08-07 DIAGNOSIS — Z7982 Long term (current) use of aspirin: Secondary | ICD-10-CM | POA: Diagnosis not present

## 2016-08-07 DIAGNOSIS — F419 Anxiety disorder, unspecified: Secondary | ICD-10-CM | POA: Diagnosis not present

## 2016-08-07 DIAGNOSIS — K209 Esophagitis, unspecified: Secondary | ICD-10-CM | POA: Diagnosis not present

## 2016-08-07 DIAGNOSIS — E1122 Type 2 diabetes mellitus with diabetic chronic kidney disease: Secondary | ICD-10-CM | POA: Diagnosis not present

## 2016-08-07 DIAGNOSIS — R1310 Dysphagia, unspecified: Secondary | ICD-10-CM | POA: Diagnosis not present

## 2016-08-07 DIAGNOSIS — Z880 Allergy status to penicillin: Secondary | ICD-10-CM | POA: Diagnosis not present

## 2016-08-07 DIAGNOSIS — N184 Chronic kidney disease, stage 4 (severe): Secondary | ICD-10-CM | POA: Insufficient documentation

## 2016-08-07 DIAGNOSIS — Z8249 Family history of ischemic heart disease and other diseases of the circulatory system: Secondary | ICD-10-CM | POA: Insufficient documentation

## 2016-08-07 DIAGNOSIS — Z888 Allergy status to other drugs, medicaments and biological substances status: Secondary | ICD-10-CM | POA: Diagnosis not present

## 2016-08-07 DIAGNOSIS — F329 Major depressive disorder, single episode, unspecified: Secondary | ICD-10-CM | POA: Diagnosis not present

## 2016-08-07 DIAGNOSIS — K259 Gastric ulcer, unspecified as acute or chronic, without hemorrhage or perforation: Secondary | ICD-10-CM | POA: Diagnosis not present

## 2016-08-07 DIAGNOSIS — R634 Abnormal weight loss: Secondary | ICD-10-CM | POA: Insufficient documentation

## 2016-08-07 DIAGNOSIS — Z853 Personal history of malignant neoplasm of breast: Secondary | ICD-10-CM | POA: Insufficient documentation

## 2016-08-07 DIAGNOSIS — K269 Duodenal ulcer, unspecified as acute or chronic, without hemorrhage or perforation: Secondary | ICD-10-CM | POA: Diagnosis not present

## 2016-08-07 DIAGNOSIS — Z79899 Other long term (current) drug therapy: Secondary | ICD-10-CM | POA: Diagnosis not present

## 2016-08-07 DIAGNOSIS — Z823 Family history of stroke: Secondary | ICD-10-CM | POA: Diagnosis not present

## 2016-08-07 HISTORY — PX: BIOPSY: SHX5522

## 2016-08-07 HISTORY — PX: ESOPHAGOGASTRODUODENOSCOPY: SHX5428

## 2016-08-07 HISTORY — PX: MALONEY DILATION: SHX5535

## 2016-08-07 LAB — GLUCOSE, CAPILLARY
GLUCOSE-CAPILLARY: 125 mg/dL — AB (ref 65–99)
GLUCOSE-CAPILLARY: 102 mg/dL — AB (ref 65–99)

## 2016-08-07 SURGERY — EGD (ESOPHAGOGASTRODUODENOSCOPY)
Anesthesia: Moderate Sedation

## 2016-08-07 MED ORDER — ONDANSETRON HCL 4 MG/2ML IJ SOLN
INTRAMUSCULAR | Status: AC
  Filled 2016-08-07: qty 2

## 2016-08-07 MED ORDER — MIDAZOLAM HCL 5 MG/5ML IJ SOLN
INTRAMUSCULAR | Status: AC
  Filled 2016-08-07: qty 10

## 2016-08-07 MED ORDER — LIDOCAINE VISCOUS 2 % MT SOLN
OROMUCOSAL | Status: AC
  Filled 2016-08-07: qty 15

## 2016-08-07 MED ORDER — LIDOCAINE VISCOUS 2 % MT SOLN
OROMUCOSAL | Status: DC | PRN
  Administered 2016-08-07: 1 via OROMUCOSAL

## 2016-08-07 MED ORDER — MEPERIDINE HCL 100 MG/ML IJ SOLN
INTRAMUSCULAR | Status: AC
  Filled 2016-08-07: qty 2

## 2016-08-07 MED ORDER — STERILE WATER FOR IRRIGATION IR SOLN
Status: DC | PRN
  Administered 2016-08-07: 2.5 mL

## 2016-08-07 MED ORDER — MIDAZOLAM HCL 5 MG/5ML IJ SOLN
INTRAMUSCULAR | Status: DC | PRN
  Administered 2016-08-07: 1 mg via INTRAVENOUS
  Administered 2016-08-07: 2 mg via INTRAVENOUS
  Administered 2016-08-07 (×2): 1 mg via INTRAVENOUS

## 2016-08-07 MED ORDER — ONDANSETRON HCL 4 MG/2ML IJ SOLN
INTRAMUSCULAR | Status: DC | PRN
  Administered 2016-08-07: 4 mg via INTRAVENOUS

## 2016-08-07 MED ORDER — SODIUM CHLORIDE 0.9 % IV SOLN
INTRAVENOUS | Status: DC
  Administered 2016-08-07: 13:00:00 via INTRAVENOUS

## 2016-08-07 MED ORDER — MEPERIDINE HCL 100 MG/ML IJ SOLN
INTRAMUSCULAR | Status: DC | PRN
  Administered 2016-08-07 (×2): 25 mg via INTRAVENOUS

## 2016-08-07 NOTE — Op Note (Signed)
[facility] Patient Name: Carly Wood Procedure Date: 08/07/2016 2:26 PM MRN: [id number] Date of Birth: 1936-01-07 Attending MD: Norvel Richards , MD CSN: [id number] Age: 81 Admit Type: Outpatient Procedure:                Upper GI endoscopy Indications:              Dysphagia Providers:                Norvel Richards, MD, Lurline Del, RN, Randa Spike, Technician Referring MD:              Medicines:                Midazolam 5 mg IV, Meperidine 50 mg IV, Ondansetron                            4 mg IV Complications:            No immediate complications. Estimated Blood Loss:     Estimated blood loss was minimal. Procedure:                Pre-Anesthesia Assessment:                           - Prior to the procedure, a History and Physical                            was performed, and patient medications and                            allergies were reviewed. The patient's tolerance of                            previous anesthesia was also reviewed. The risks                            and benefits of the procedure and the sedation                            options and risks were discussed with the patient.                            All questions were answered, and informed consent                            was obtained. Prior Anticoagulants: The patient has                            taken no previous anticoagulant or antiplatelet                            agents. ASA Grade Assessment: III - A patient with  severe systemic disease. After reviewing the risks                            and benefits, the patient was deemed in                            satisfactory condition to undergo the procedure.                           After obtaining informed consent, the endoscope was                            passed under direct vision. Throughout the                            procedure, the patient's blood pressure,  pulse, and                            oxygen saturations were monitored continuously. The                            [id number]-[id number] ([id number]) scope was introduced through the                            mouth, and advanced to the second part of duodenum.                            The upper GI endoscopy was accomplished without                            difficulty. The patient tolerated the procedure                            well. The patient tolerated the procedure well. Scope In: 2:42:23 PM Scope Out: 2:48:56 PM Total Procedure Duration: 0 hours 6 minutes 33 seconds  Findings:      LA Grade A (one or more mucosal breaks less than 5 mm, not extending       between tops of 2 mucosal folds) esophagitis was found 34 to 36 cm from       the incisors. No stricture. No Barrett's epithelium      A small hiatal hernia was present. This was biopsied with a cold forceps       for histology. Estimated blood loss was minimal.      multiple 2-3 mm antral prepyloric ulcers. Surrounding erosions.       Benign-appearing ulceration.      Multiple diffuse erosions were found in the duodenal bulb. The scope was       withdrawn. Dilation was performed with a Maloney dilator with mild       resistance at 43 Fr. The dilation site was examined following endoscope       reinsertion and showed no change. Estimated blood loss: none. Impression:               - LA Grade A esophagitis. Dilated.                           -  Small hiatal hernia.                           - multiple gastric ulcers and erosions. Duodenal                            erosions.Biopsied. .                           - Duodenal erosions. Moderate Sedation:      Moderate (conscious) sedation was administered by the endoscopy nurse       and supervised by the endoscopist. The following parameters were       monitored: oxygen saturation, heart rate, blood pressure, respiratory       rate, EKG, adequacy of pulmonary ventilation, and response to  care.       Total physician intraservice time was 17 minutes. Recommendation:           - Patient has a contact number available for                            emergencies. The signs and symptoms of potential                            delayed complications were discussed with the                            patient. Return to normal activities tomorrow.                            Written discharge instructions were provided to the                            patient.                           - Resume previous diet.                           - Continue present medications. Increase Nexium to                            40 mg twice daily. Avoid nonsteroidal agents.                            Further recommendations to follow pending review of                            pathology report                           - Repeat upper endoscopy in 3 months for                            surveillance based on pathology results.                           -  Return to GI clinic in 3 months. Procedure Code(s):        --- Professional ---                           [phone], Esophagogastroduodenoscopy, flexible,                            transoral; with biopsy, single or multiple                           43450, Dilation of esophagus, by unguided sound or                            bougie, single or multiple passes                           99152, Moderate sedation services provided by the                            same physician or other qualified health care                            professional performing the diagnostic or                            therapeutic service that the sedation supports,                            requiring the presence of an independent trained                            observer to assist in the monitoring of the                            patient's level of consciousness and physiological                            status; initial 15 minutes of intraservice time,                             patient age 12 years or older Diagnosis Code(s):        --- Professional ---                           K20.9, Esophagitis, unspecified                           K44.9, Diaphragmatic hernia without obstruction or                            gangrene                           K26.9, Duodenal ulcer, unspecified as acute or  chronic, without hemorrhage or perforation                           R13.10, Dysphagia, unspecified CPT copyright 2016 [employer]. All rights reserved. The codes documented in this report are preliminary and upon coder review may  be revised to meet current compliance requirements. Cristopher Estimable. Bethani Brugger, MD Norvel Richards, MD 08/07/2016 3:01:43 PM This report has been signed electronically. Number of Addenda: 0

## 2016-08-07 NOTE — Telephone Encounter (Signed)
Pt's sister called office to verify pt's arrival time for procedure today. Endo Scheduler advised to arrive 1:00pm-1:15pm. Sister informed.

## 2016-08-07 NOTE — Discharge Instructions (Addendum)
EGD Discharge instructions Please read the instructions outlined below and refer to this sheet in the next few weeks. These discharge instructions provide you with general information on caring for yourself after you leave the hospital. Your doctor may also give you specific instructions. While your treatment has been planned according to the most current medical practices available, unavoidable complications occasionally occur. If you have any problems or questions after discharge, please call your doctor. ACTIVITY  You may resume your regular activity but move at a slower pace for the next 24 hours.   Take frequent rest periods for the next 24 hours.   Walking will help expel (get rid of) the air and reduce the bloated feeling in your abdomen.   No driving for 24 hours (because of the anesthesia (medicine) used during the test).   You may shower.   Do not sign any important legal documents or operate any machinery for 24 hours (because of the anesthesia used during the test).  NUTRITION  Drink plenty of fluids.   You may resume your normal diet.   Begin with a light meal and progress to your normal diet.   Avoid alcoholic beverages for 24 hours or as instructed by your caregiver.  MEDICATIONS  You may resume your normal medications unless your caregiver tells you otherwise.  WHAT YOU CAN EXPECT TODAY  You may experience abdominal discomfort such as a feeling of fullness or gas pains.  FOLLOW-UP  Your doctor will discuss the results of your test with you.  SEEK IMMEDIATE MEDICAL ATTENTION IF ANY OF THE FOLLOWING OCCUR:  Excessive nausea (feeling sick to your stomach) and/or vomiting.   Severe abdominal pain and distention (swelling).   Trouble swallowing.   Temperature over 101 F (37.8 C).   Rectal bleeding or vomiting of blood.    GERD and peptic ulcer disease information provided  Increase Nexium to 40 mg twice daily  Office visit with [place] in 3 months     (Office will call to set up appointment. Left message at office)  Further recommendations to follow pending review of pathology report  Avoid nonsteroidal medication such as Advil and Aleve.*******************************     Esophagogastroduodenoscopy, Care After Refer to this sheet in the next few weeks. These instructions provide you with information about caring for yourself after your procedure. Your health care provider may also give you more specific instructions. Your treatment has been planned according to current medical practices, but problems sometimes occur. Call your health care provider if you have any problems or questions after your procedure. What can I expect after the procedure? After the procedure, it is common to have:  A sore throat.  Nausea.  Bloating.  Dizziness.  Fatigue.  Follow these instructions at home:  Do not eat or drink anything until the numbing medicine (local anesthetic) has worn off and your gag reflex has returned. You will know that the local anesthetic has worn off when you can swallow comfortably.  Do not drive for 24 hours if you received a medicine to help you relax (sedative).  If your health care provider took a tissue sample for testing during the procedure, make sure to get your test results. This is your responsibility. Ask your health care provider or the department performing the test when your results will be ready.  Keep all follow-up visits as told by your health care provider. This is important. Contact a health care provider if:  You cannot stop coughing.  You are not urinating.  You are urinating less than usual. Get help right away if:  You have trouble swallowing.  You cannot eat or drink.  You have throat or chest pain that gets worse.  You are dizzy or light-headed.  You faint.  You have nausea or vomiting.  You have chills.  You have a fever.  You have severe abdominal pain.  You have black,  tarry, or bloody stools. This information is not intended to replace advice given to you by your health care provider. Make sure you discuss any questions you have with your health care provider. Document Released: 02/10/2012 Document Revised: 08/01/2015 Document Reviewed: 01/17/2015 Elsevier Interactive Patient Education  2018 [employer].     Peptic Ulcer A peptic ulcer is a painful sore in the lining of your esophagus, stomach, or the first part of your small intestine. You may have pain in the area between your chest and your belly button. The most common causes of an ulcer are:  An infection.  Using certain pain medicines too often or too much.  Follow these instructions at home:  Avoid alcohol.  Avoid caffeine.  Do not use any tobacco products. These include cigarettes, chewing tobacco, and e-cigarettes. If you need help quitting, ask your doctor.  Take over-the-counter and prescription medicines only as told by your doctor. Do not stop or change your medicines unless you talk with your doctor about it first.  Keep all follow-up visits as told by your doctor. This is important. Contact a doctor if:  You do not get better in 7 days after you start treatment.  You keep having an upset stomach (indigestion) or heartburn. Get help right away if:  You have sudden, sharp pain in your belly (abdomen).  You have lasting belly pain.  You have bloody poop (stool) or black, tarry poop.  You throw up (vomit) blood. It may look like coffee grounds.  You feel light-headed or feel like you may pass out (faint).  You get weak.  You get sweaty or feel sticky and cold to the touch (clammy). This information is not intended to replace advice given to you by your health care provider. Make sure you discuss any questions you have with your health care provider. Document Released: 05/20/2009 Document Revised: 07/10/2015 Document Reviewed: 11/24/2014 Elsevier Interactive Patient  Education  2018 [place].     Gastroesophageal Reflux Disease, Adult Normally, food travels down the esophagus and stays in the stomach to be digested. If a person has gastroesophageal reflux disease (GERD), food and stomach acid move back up into the esophagus. When this happens, the esophagus becomes sore and swollen (inflamed). Over time, GERD can make small holes (ulcers) in the lining of the esophagus. Follow these instructions at home: Diet  Follow a diet as told by your doctor. You may need to avoid foods and drinks such as: ? Coffee and tea (with or without caffeine). ? Drinks that contain alcohol. ? Energy drinks and sports drinks. ? Carbonated drinks or sodas. ? Chocolate and cocoa. ? Peppermint and mint flavorings. ? Garlic and onions. ? Horseradish. ? Spicy and acidic foods, such as peppers, chili powder, curry powder, vinegar, hot sauces, and BBQ sauce. ? Citrus fruit juices and citrus fruits, such as oranges, lemons, and limes. ? Tomato-based foods, such as red sauce, chili, salsa, and pizza with red sauce. ? Fried and fatty foods, such as donuts, french fries, potato chips, and high-fat dressings. ? High-fat meats, such as hot dogs, rib eye steak, sausage,  ham, and bacon. ? High-fat dairy items, such as whole milk, butter, and cream cheese.  Eat small meals often. Avoid eating large meals.  Avoid drinking large amounts of liquid with your meals.  Avoid eating meals during the 2-3 hours before bedtime.  Avoid lying down right after you eat.  Do not exercise right after you eat. General instructions  Pay attention to any changes in your symptoms.  Take over-the-counter and prescription medicines only as told by your doctor. Do not take aspirin, ibuprofen, or other NSAIDs unless your doctor says it is okay.  Do not use any tobacco products, including cigarettes, chewing tobacco, and e-cigarettes. If you need help quitting, ask your doctor.  Wear loose  clothes. Do not wear anything tight around your waist.  Raise (elevate) the head of your bed about 6 inches (15 cm).  Try to lower your stress. If you need help doing this, ask your doctor.  If you are overweight, lose an amount of weight that is healthy for you. Ask your doctor about a safe weight loss goal.  Keep all follow-up visits as told by your doctor. This is important. Contact a doctor if:  You have new symptoms.  You lose weight and you do not know why it is happening.  You have trouble swallowing, or it hurts to swallow.  You have wheezing or a cough that keeps happening.  Your symptoms do not get better with treatment.  You have a hoarse voice. Get help right away if:  You have pain in your arms, neck, jaw, teeth, or back.  You feel sweaty, dizzy, or light-headed.  You have chest pain or shortness of breath.  You throw up (vomit) and your throw up looks like blood or coffee grounds.  You pass out (faint).  Your poop (stool) is bloody or black.  You cannot swallow, drink, or eat. This information is not intended to replace advice given to you by your health care provider. Make sure you discuss any questions you have with your health care provider. Document Released: 08/12/2007 Document Revised: 08/01/2015 Document Reviewed: 06/20/2014 Elsevier Interactive Patient Education  2018 [place] for Peptic Ulcer Disease When you have peptic ulcer disease, the foods you eat and your eating habits are very important. Choosing the right foods can help ease the discomfort of peptic ulcer disease. What general guidelines do I need to follow?  Choose fruits, vegetables, whole grains, and low-fat meat, fish, and poultry.  Keep a food diary to identify foods that cause symptoms.  Avoid foods that cause irritation or pain. These may be different for different people.  Eat frequent small meals instead of three large meals each day. The pain may be  worse when your stomach is empty.  Avoid eating close to bedtime. What foods are not recommended? The following are some foods and drinks that may worsen your symptoms:  Black, white, and red pepper.  Hot sauce.  Chili peppers.  Chili powder.  Chocolate and cocoa.  Alcohol.  Tea, coffee, and cola (regular and decaffeinated).  The items listed above may not be a complete list of foods and beverages to avoid. Contact your dietitian for more information. This information is not intended to replace advice given to you by your health care provider. Make sure you discuss any questions you have with your health care provider. Document Released: 05/18/2011 Document Revised: 08/01/2015 Document Reviewed: 12/28/2012 Elsevier Interactive Patient Education  2017 [employer].

## 2016-08-07 NOTE — Interval H&P Note (Signed)
History and Physical Interval Note:  08/07/2016 2:26 PM  Carly Wood  has presented today for surgery, with the diagnosis of dysphagia/weight loss  The various methods of treatment have been discussed with the patient and family. After consideration of risks, benefits and other options for treatment, the patient has consented to  Procedure(s) with comments: ESOPHAGOGASTRODUODENOSCOPY (EGD) (N/A) - 1215-rescheduled to 6/1 @ 2:30pm per Vickey Huger DILATION (N/A) as a surgical intervention .  The patient's history has been reviewed, patient examined, no change in status, stable for surgery.  I have reviewed the patient's chart and labs.  Questions were answered to the patient's satisfaction.     [name]  No change. EGD with ED as feasible /appropriate per plan. The risks, benefits, limitations, alternatives and imponderables have been reviewed with the patient. Potential for esophageal dilation, biopsy, etc. have also been reviewed.  Questions have been answered. All parties agreeable.

## 2016-08-07 NOTE — H&P (View-Only) (Signed)
Primary Care Physician:  Monico Blitz, MD Referring Provider: Dr. Twana First Primary Gastroenterologist:  Garfield Cornea, MD   Chief Complaint  Patient presents with  . Dysphagia    food/pills, new onset x 1 month    HPI:  Carly Wood is a 81 y.o. female here at the request of Dr. Talbert Cage for further evaluation of new onset dysphagia. Patient's past medical history significant for breast cancer currently on Herceptin/aremidex.  She has anxiety, diabetes, chronic renal disease.   Patient was seen by speech therapy (06/2016) and had a modified barium swallow study in conjunction with radiology. She was found have mild to moderate oropharyngeal dysphagia with reduced bolus formation, piecemeal deglutition, prolonged oral transit time, spillover to the pharynx secondary to reduced oral motor strength/R a.m. Patient had aspiration of thin liquids with cough response. Patient chewed regular solids but spit out reporting that she can swallow it. She spit out the barium tablet as well. No esophageal abnormality observed during esophageal sweep. Recommended pured diet with nectar thick liquids consider further neurological workup given report sudden onset dysphagia.  Patient presents with a sister today. Her sister provides most of the history. Apparently in early April patient started to have difficulty swallowing solid foods and pills. They are both quite difficult historians. But what I can gather is that over the past couple of years she has had increased issues with heartburn and it was just more recently that it was noted that she predominantly would spit out solid foods and pills. No noted issues with soft foods or liquids. No reported abdominal pain. Apparently her bowel function is been unremarkable. Patient denies blood in the stool or melena.  According to affect her baseline weight appears to run between 185 to 190 pounds. Since March she's lost about 9 pounds.  Patient and sister had reported  to Dr. Talbert Cage that they had started pureing her food 2 weeks after the symptoms started. Patient did end up in the ED and apparently saw speech therapy on 07/03/2016 with the findings as outlined above.  At this time patient is consuming pured diet. They place her pills within her pured diet she's take some that way. There's been no coughing fits with swallowing. They're supplementing the diet with Glucerna. I inquired a couple of different times if there have been any concerns about memory issues or cognitive decline none reported.  Patient reports last colonoscopy about 10 years ago in the [place] and was normal. No egd  Current Outpatient Prescriptions  Medication Sig Dispense Refill  . acetaminophen (TYLENOL) 500 MG tablet Take 500 mg by mouth every 6 (six) hours as needed.     Marland Kitchen amLODipine (NORVASC) 5 MG tablet Take 5 mg by mouth daily.     Marland Kitchen anastrozole (ARIMIDEX) 1 MG tablet Take 1 tablet (1 mg total) by mouth daily. 90 tablet 3  . aspirin 81 MG chewable tablet Chew by mouth.    . calcium-vitamin D (OSCAL WITH D) 500-200 MG-UNIT tablet Take 2 tablets by mouth.     . cloNIDine (CATAPRES) 0.3 MG tablet Take 0.3 mg by mouth 2 (two) times daily.     Marland Kitchen esomeprazole (NEXIUM) 40 MG capsule Take 40 mg by mouth daily.     . Insulin Degludec (TRESIBA FLEXTOUCH [place]) Inject 5 Units into the skin.    Marland Kitchen lidocaine-prilocaine (EMLA) cream Apply 1 application topically as needed. Apply to port site prior when needed for chemo, labs or port flushes. 30 g 2  .  nebivolol (BYSTOLIC) 10 MG tablet Take 10 mg by mouth daily.     . NERLYNX 40 MG tablet TAKE 6 TABLETS (240MG) BY MOUTH ONE TIME DAILY WITH FOOD. 180 tablet 0  . ondansetron (ZOFRAN) 8 MG tablet Take 1 tablet (8 mg total) by mouth every 8 (eight) hours as needed for nausea or vomiting. 30 tablet 2  . PARoxetine (PAXIL) 20 MG tablet Take 20 mg by mouth daily.     . pioglitazone (ACTOS) 30 MG tablet Take 15 mg by mouth 1 day or 1 dose.    . pravastatin  (PRAVACHOL) 20 MG tablet Reported on 08/30/2015    . prochlorperazine (COMPAZINE) 10 MG tablet Take 1 tablet (10 mg total) by mouth every 6 (six) hours as needed for nausea or vomiting. 30 tablet 2  . traZODone (DESYREL) 50 MG tablet Take 100 mg by mouth at bedtime.     Marland Kitchen VITAMIN B COMPLEX-C PO Take by mouth.     No current facility-administered medications for this visit.     Allergies as of 07/31/2016 - Review Complete 07/31/2016  Allergen Reaction Noted  . Penicillins Other (See Comments) 08/19/2015  . Ace inhibitors Cough and Other (See Comments) 08/19/2015  . Lisinopril Other (See Comments) 08/19/2015    Past Medical History:  Diagnosis Date  . Anemia   . Anxiety   . Chronic kidney disease   . Chronic renal disease, stage 4, severely decreased glomerular filtration rate (GFR) between 15-29 mL/min/1.73 square meter (HCC) 08/22/2015  . Depression   . Diabetes mellitus without complication ([place])   . Hypertension   . Stage 1 infiltrating ductal carcinoma of right female breast ([place]) 08/21/2015   ER+ PR+ HER 2 neu + (3+) T1cN0     Past Surgical History:  Procedure Laterality Date  . MASTECTOMY, PARTIAL    . RETINAL DETACHMENT SURGERY Right     Family History  Problem Relation Age of Onset  . Multiple myeloma Sister   . Brain cancer Sister   . Dementia Mother        died at 25  . Stroke Mother   . Heart failure Mother   . Diabetes Mother   . Heart disease Father   . Prostate cancer Brother   . Colon cancer Neg Hx     Social History   Social History  . Marital status: Single    Spouse name: N/A  . Number of children: N/A  . Years of education: N/A   Occupational History  . Not on file.   Social History Main Topics  . Smoking status: Never Smoker  . Smokeless tobacco: Never Used  . Alcohol use No  . Drug use: No  . Sexual activity: Not on file   Other Topics Concern  . Not on file   Social History Narrative  . No narrative on file       ROS:  General: Negative for anorexia,  fever, chills, fatigue, weakness.See history of present illness Eyes: Negative for vision changes.  ENT: Negative for hoarseness, , nasal congestion. See history of present illness CV: Negative for chest pain, angina, palpitations, dyspnea on exertion, peripheral edema.  Respiratory: Negative for dyspnea at rest, dyspnea on exertion, cough, sputum, wheezing.  GI: See history of present illness. GU:  Negative for dysuria, hematuria, urinary incontinence, urinary frequency, nocturnal urination.  MS: Negative for joint pain, low back pain.  Derm: Negative for rash or itching.  Neuro: Negative for weakness, abnormal sensation, seizure, frequent headaches, memory  loss, confusion.  Psych: Negative for anxiety, depression, suicidal ideation, hallucinations.  Endo: See history of present illness Heme: Negative for bruising or bleeding. Allergy: Negative for rash or hives.    Physical Examination:  BP 135/77   Pulse 75   Temp 97.7 F (36.5 C) (Oral)   Ht 5' 3.5" (1.613 m)   Wt 177 lb 3.2 oz (80.4 kg)   BMI 30.90 kg/m    General: Well-nourished, well-developed in no acute distress.  Head: Normocephalic, atraumatic.   Eyes: Conjunctiva pink, no icterus. Mouth: Oropharyngeal mucosa moist and pink , no lesions erythema or exudate. Neck: Supple without thyromegaly, masses, or lymphadenopathy.  Lungs: Clear to auscultation bilaterally.  Heart: Regular rate and rhythm, no murmurs rubs or gallops.  Abdomen: Bowel sounds are normal, nontender, nondistended, no hepatosplenomegaly or masses, no abdominal bruits or    hernia , no rebound or guarding.   Rectal: Not performed Extremities: No lower extremity edema. No clubbing or deformities.  Neuro: Alert and oriented x 4 , grossly normal neurologically.  Skin: Warm and dry, no rash or jaundice.   Psych: Alert and cooperative, normal mood and affect.  Labs: Lab Results  Component Value Date   CREATININE  2.89 (H) 07/27/2016   BUN 26 (H) 07/27/2016   NA 140 07/27/2016   K 4.5 07/27/2016   CL 109 07/27/2016   CO2 23 07/27/2016   Lab Results  Component Value Date   ALT 11 (L) 07/27/2016   AST 19 07/27/2016   ALKPHOS 72 07/27/2016   BILITOT 0.4 07/27/2016   Lab Results  Component Value Date   WBC 7.9 07/27/2016   HGB 9.9 (L) 07/27/2016   HCT 30.1 (L) 07/27/2016   MCV 95.0 07/27/2016   PLT 279 07/27/2016   No results found for: IRON, TIBC, FERRITIN No results found for: VITAMINB12 No results found for: FOLATE   Imaging Studies: No results found.

## 2016-08-10 ENCOUNTER — Encounter: Payer: Self-pay | Admitting: Internal Medicine

## 2016-08-12 ENCOUNTER — Encounter: Payer: Self-pay | Admitting: Internal Medicine

## 2016-08-17 ENCOUNTER — Encounter (HOSPITAL_COMMUNITY): Payer: Self-pay | Admitting: Internal Medicine

## 2016-08-17 ENCOUNTER — Ambulatory Visit (HOSPITAL_COMMUNITY)
Admission: RE | Admit: 2016-08-17 | Discharge: 2016-08-17 | Disposition: A | Discharge status: Home / Self Care | Payer: Medicare Other | Source: Ambulatory Visit | Attending: Hematology & Oncology | Admitting: Hematology & Oncology

## 2016-08-17 DIAGNOSIS — Z1231 Encounter for screening mammogram for malignant neoplasm of breast: Secondary | ICD-10-CM | POA: Diagnosis present

## 2016-08-17 DIAGNOSIS — C50911 Malignant neoplasm of unspecified site of right female breast: Secondary | ICD-10-CM

## 2016-08-17 DIAGNOSIS — Z1239 Encounter for other screening for malignant neoplasm of breast: Secondary | ICD-10-CM

## 2016-08-28 ENCOUNTER — Other Ambulatory Visit (HOSPITAL_COMMUNITY): Payer: Self-pay | Admitting: Adult Health

## 2016-08-28 DIAGNOSIS — C50919 Malignant neoplasm of unspecified site of unspecified female breast: Secondary | ICD-10-CM

## 2016-08-28 DIAGNOSIS — C50911 Malignant neoplasm of unspecified site of right female breast: Secondary | ICD-10-CM

## 2016-08-31 ENCOUNTER — Encounter (HOSPITAL_COMMUNITY): Payer: Self-pay

## 2016-08-31 ENCOUNTER — Other Ambulatory Visit (HOSPITAL_COMMUNITY): Payer: Self-pay | Admitting: Emergency Medicine

## 2016-08-31 ENCOUNTER — Other Ambulatory Visit (HOSPITAL_COMMUNITY): Payer: Self-pay | Admitting: Adult Health

## 2016-08-31 ENCOUNTER — Encounter (HOSPITAL_COMMUNITY): Payer: Medicare Other | Attending: Oncology

## 2016-08-31 ENCOUNTER — Ambulatory Visit (HOSPITAL_COMMUNITY): Payer: Medicare Other

## 2016-08-31 ENCOUNTER — Encounter (HOSPITAL_BASED_OUTPATIENT_CLINIC_OR_DEPARTMENT_OTHER): Payer: Medicare Other | Admitting: Oncology

## 2016-08-31 VITALS — BP 160/71 | HR 72 | Temp 98.7°F | Resp 18 | Ht 63.0 in | Wt 177.5 lb

## 2016-08-31 DIAGNOSIS — D472 Monoclonal gammopathy: Secondary | ICD-10-CM

## 2016-08-31 DIAGNOSIS — D649 Anemia, unspecified: Secondary | ICD-10-CM

## 2016-08-31 DIAGNOSIS — C50911 Malignant neoplasm of unspecified site of right female breast: Secondary | ICD-10-CM | POA: Insufficient documentation

## 2016-08-31 DIAGNOSIS — Z79811 Long term (current) use of aromatase inhibitors: Secondary | ICD-10-CM | POA: Diagnosis not present

## 2016-08-31 DIAGNOSIS — C50411 Malignant neoplasm of upper-outer quadrant of right female breast: Secondary | ICD-10-CM | POA: Diagnosis not present

## 2016-08-31 DIAGNOSIS — Z170 Estrogen receptor positive status [ER+]: Secondary | ICD-10-CM

## 2016-08-31 DIAGNOSIS — Z452 Encounter for adjustment and management of vascular access device: Secondary | ICD-10-CM | POA: Diagnosis not present

## 2016-08-31 DIAGNOSIS — C50919 Malignant neoplasm of unspecified site of unspecified female breast: Secondary | ICD-10-CM

## 2016-08-31 DIAGNOSIS — N183 Chronic kidney disease, stage 3 (moderate): Secondary | ICD-10-CM

## 2016-08-31 LAB — CBC WITH DIFFERENTIAL/PLATELET
Basophils Absolute: 0 10*3/uL (ref 0.0–0.1)
Basophils Relative: 0 %
EOS PCT: 4 %
Eosinophils Absolute: 0.3 10*3/uL (ref 0.0–0.7)
HEMATOCRIT: 27.9 % — AB (ref 36.0–46.0)
Hemoglobin: 8.9 g/dL — ABNORMAL LOW (ref 12.0–15.0)
LYMPHS ABS: 0.9 10*3/uL (ref 0.7–4.0)
LYMPHS PCT: 12 %
MCH: 30.4 pg (ref 26.0–34.0)
MCHC: 31.9 g/dL (ref 30.0–36.0)
MCV: 95.2 fL (ref 78.0–100.0)
MONO ABS: 0.2 10*3/uL (ref 0.1–1.0)
Monocytes Relative: 3 %
NEUTROS ABS: 5.8 10*3/uL (ref 1.7–7.7)
Neutrophils Relative %: 81 %
PLATELETS: 284 10*3/uL (ref 150–400)
RBC: 2.93 MIL/uL — AB (ref 3.87–5.11)
RDW: 16.2 % — ABNORMAL HIGH (ref 11.5–15.5)
WBC: 7.2 10*3/uL (ref 4.0–10.5)

## 2016-08-31 LAB — COMPREHENSIVE METABOLIC PANEL
ALT: 12 U/L — AB (ref 14–54)
AST: 19 U/L (ref 15–41)
Albumin: 3.1 g/dL — ABNORMAL LOW (ref 3.5–5.0)
Alkaline Phosphatase: 71 U/L (ref 38–126)
Anion gap: 10 (ref 5–15)
BILIRUBIN TOTAL: 0.4 mg/dL (ref 0.3–1.2)
BUN: 29 mg/dL — AB (ref 6–20)
CHLORIDE: 104 mmol/L (ref 101–111)
CO2: 26 mmol/L (ref 22–32)
CREATININE: 2.7 mg/dL — AB (ref 0.44–1.00)
Calcium: 8.6 mg/dL — ABNORMAL LOW (ref 8.9–10.3)
GFR, EST AFRICAN AMERICAN: 18 mL/min — AB (ref >60)
GFR, EST NON AFRICAN AMERICAN: 16 mL/min — AB (ref >60)
Glucose, Bld: 144 mg/dL — ABNORMAL HIGH (ref 65–99)
Potassium: 4.1 mmol/L (ref 3.5–5.1)
Sodium: 140 mmol/L (ref 135–145)
TOTAL PROTEIN: 7.1 g/dL (ref 6.5–8.1)

## 2016-08-31 MED ORDER — HEPARIN SOD (PORK) LOCK FLUSH 100 UNIT/ML IV SOLN
INTRAVENOUS | Status: AC
  Filled 2016-08-31: qty 5

## 2016-08-31 MED ORDER — NERATINIB MALEATE 40 MG PO TABS: 240.0000 mg | ORAL_TABLET | Freq: Every day | ORAL | 0 refills | Status: DC

## 2016-08-31 MED ORDER — SODIUM CHLORIDE 0.9% FLUSH
20.0000 mL | INTRAVENOUS | Status: DC | PRN
  Administered 2016-08-31: 20 mL via INTRAVENOUS
  Filled 2016-08-31: qty 20

## 2016-08-31 MED ORDER — HEPARIN SOD (PORK) LOCK FLUSH 100 UNIT/ML IV SOLN
500.0000 [IU] | Freq: Once | INTRAVENOUS | Status: AC
Start: 2016-08-31 — End: 2016-08-31
  Administered 2016-08-31: 500 [IU] via INTRAVENOUS

## 2016-08-31 NOTE — Progress Notes (Signed)
Carly Wood tolerated port lab drawn with flush well without complaints or incident.Port accessed with 20 gauge needle with blood drawn for labs then flushed with 20 ml NS and 5 ml Heparin easily per protocol. Pt discharged self ambulatory in satisfactory condition accompanied by her sister

## 2016-08-31 NOTE — Patient Instructions (Signed)
[facility] Discharge Instructions  RECOMMENDATIONS MADE BY THE CONSULTANT AND ANY TEST RESULTS WILL BE SENT TO YOUR REFERRING PHYSICIAN. Labs drawn from portacath then flushed per protocol today. Follow-up as scheduled. Call clinic for any questions or concerns  Thank you for choosing [facility] at [facility] to provide your oncology and hematology care.  To afford each patient quality time with our provider, please arrive at least 15 minutes before your scheduled appointment time.    If you have a lab appointment with the Cancer Center please come in thru the  Main Entrance and check in at the main information desk  You need to re-schedule your appointment should you arrive 10 or more minutes late.  We strive to give you quality time with our providers, and arriving late affects you and other patients whose appointments are after yours.  Also, if you no show three or more times for appointments you may be dismissed from the clinic at the providers discretion.     Again, thank you for choosing [facility].  Our hope is that these requests will decrease the amount of time that you wait before being seen by our physicians.       _____________________________________________________________  Should you have questions after your visit to  Cancer Center, please contact our office at [phone] between the hours of 8:30 a.m. and 4:30 p.m.  Voicemails left after 4:30 p.m. will not be returned until the following business day.  For prescription refill requests, have your pharmacy contact our office.       Resources For Cancer Patients and their Caregivers ? American Cancer Society: Can assist with transportation, wigs, general needs, runs Look Good Feel Better.        1-888-227-6333 ? Cancer Care: Provides financial assistance, online support groups, medication/co-pay assistance.  1-800-813-HOPE  (4673) ? [facility] Assists [place] cancer patients and their families through emotional , educational and financial support.  336-427-4357 ? [place] DSS Where to apply for food stamps, Medicaid and utility assistance. 336-342-1394 ? [facility]: Transportation to medical appointments. 336-347-2287 ? Social Security Administration: May apply for disability if have a Stage IV cancer. 336-342-7796 1-800-772-1213 ? [place] Aging, Disability and Transit Services: Assists with nutrition, care and transit needs. 336-349-2343  Cancer Center Support Programs: @10RELATIVEDAYS@ > Cancer Support Group  2nd Tuesday of the month 1pm-2pm, Journey Room  > Creative Journey  3rd Tuesday of the month 1130am-1pm, Journey Room  > Look Good Feel Better  1st Wednesday of the month 10am-12 noon, Journey Room (Call American Cancer Society to register 1-800-395-5775)   

## 2016-08-31 NOTE — Progress Notes (Signed)
[facility]  Progress Note  CHIEF COMPLAINTS:    Stage 1 infiltrating ductal carcinoma of right female breast ([place])   09/12/2014 Mammogram    Mass in upper outer R breast, middle third depth appears slightly larger than it was on prior exam with more irreg spiculated margins      10/02/2014 Pathology Results    biopsy with invasive ductal carcinoma high grade 1.1 cm, dcis solid type. additional R breast tissue, excision, invasive ductal high grade 0.9 cm       10/24/2014 Pathology Results    no residual invasive carcinoma, DCIS, focal, atypical ductal hyperplasia, 0/7 LN positive for metastatic carcinoma ER > 90%, PR 30%, HER 2 2+      10/24/2014 Cancer Staging    [id number]      10/24/2014 Surgery    R mastectomy, T1c, N0      12/28/2014 - 11/22/2015 Chemotherapy    Taxol/herceptin weekly X 12, Herceptin every 21 days, last due in September 2017      03/11/2015 -  Anti-estrogen oral therapy    Arimidex 1 mg daily      09/12/2015 Imaging    MUGA- The left ventricular ejection fraction equals 68%.      06/08/2016 Treatment Plan Change    Started Nerlynx      HISTORY OF PRESENTING ILLNESS:  Carly Wood 81 y.o. female is here for ongoing care for a stage I ER+ PR+ HER 2 + carcinoma of the R breastWith her sister. Patient states she is tolerating nerlynx well. She denies any nausea, vomiting, diarrhea. She states her appetite is good and she is sleeping well. She denies any recent weight loss. Sister who she lives with states that the patient has been doing well. She has no complaints today.  MEDICAL HISTORY:  Past Medical History:  Diagnosis Date  . Anemia   . Anxiety   . Chronic kidney disease   . Chronic renal disease, stage 4, severely decreased glomerular filtration rate (GFR) between 15-29 mL/min/1.73 square meter (HCC) 08/22/2015  . Depression   . Diabetes mellitus without complication ([place])   . Hypertension   . Stage 1 infiltrating ductal carcinoma of  right female breast ([place]) 08/21/2015   ER+ PR+ HER 2 neu + (3+) T1cN0     SURGICAL HISTORY: Past Surgical History:  Procedure Laterality Date  . BIOPSY  08/07/2016   Procedure: BIOPSY;  Surgeon: Daneil Dolin, MD;  [facility];  Service: Endoscopy;;  gastric ulcer biopsy  . ESOPHAGOGASTRODUODENOSCOPY N/A 08/07/2016   Procedure: ESOPHAGOGASTRODUODENOSCOPY (EGD);  Surgeon: Daneil Dolin, MD;  Location: [facility];  Service: Endoscopy;  Laterality: N/A;  1215-rescheduled to 6/1 @ 2:30pm per Tretha Sciara  . MALONEY DILATION N/A 08/07/2016   Procedure: Venia Minks DILATION;  Surgeon: Daneil Dolin, MD;  Location: [facility];  Service: Endoscopy;  Laterality: N/A;  . MASTECTOMY, PARTIAL    . RETINAL DETACHMENT SURGERY Right     SOCIAL HISTORY: Social History   Social History  . Marital status: Single    Spouse name: N/A  . Number of children: N/A  . Years of education: N/A   Occupational History  . Not on file.   Social History Main Topics  . Smoking status: Never Smoker  . Smokeless tobacco: Never Used  . Alcohol use No  . Drug use: No  . Sexual activity: Not on file   Other Topics Concern  . Not on file   Social  History Narrative  . No narrative on file   Carly Wood, the patient, never married; says she never wanted to. She remarks that this was a choice she made; "not that I didn't have the opportunities, I just made the choice not to do it." NO tobacco, cigarettes or chewing tobacco. No ETOH  The 3 sisters live together. Deneise Lever does all the cooking.  She has nieces and nephews; one teaches school in [place]. Pilar Plate is her oldest brother; he was a principal. Has retired. Pilar Plate has a wife who was a Pharmacist, hospital; they have 3 children, including a son who graduated from [employer], and a daughter who graduated from [employer] who is a Sport and exercise psychologist, and another daughter who has a PhD in education. Harle Battiest is her "favorite niece." Martin Majestic to college and is teaching something to do with  drafting.  The patient, Carly Wood, was a good Ship broker; graduated high school and college. 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 Taught in [place], [place].  She notes she had hobbies; used to do artwork. Drawing pictures, mostly flowers, houses. Pen and pencil, colored pencils.  FAMILY HISTORY: Family History  Problem Relation Age of Onset  . Multiple myeloma Sister   . Brain cancer Sister   . Dementia Mother        died at 33  . Stroke Mother   . Heart failure Mother   . Diabetes Mother   . Heart disease Father   . Prostate cancer Brother   . 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  Father was 3 when he died; heart disease. Mother was 39 when she died. Diabetic, with congestive heart failure. Had a stroke as well. Sister Deneise Lever was caregiver for their mother for 10 years after her stroke. Her sister Wilford Sports has multiple myeloma and also had a CNS lymphoma, and notes that she is on Revlamid. Wilford Sports is 16 years out from her brain surgery.  7 brothers and sisters total; one is deceased. 6 siblings left. 1 that passed died of prostate cancer. "He lasted 12 years."  ALLERGIES:  is allergic to penicillins; ace inhibitors; and lisinopril.  MEDICATIONS:  Current Outpatient Prescriptions  Medication Sig Dispense Refill  . acetaminophen (TYLENOL) 500 MG tablet Take 500 mg by mouth every 6 (six) hours as needed.     Marland Kitchen amLODipine (NORVASC) 5 MG tablet Take 5 mg by mouth daily.     Marland Kitchen anastrozole (ARIMIDEX) 1 MG tablet Take 1 tablet (1 mg total) by mouth daily. 90 tablet 3  . aspirin 81 MG chewable tablet Chew by mouth.    . calcium-vitamin D (OSCAL WITH D)  500-200 MG-UNIT tablet Take 2 tablets by mouth.     . cloNIDine (CATAPRES) 0.3 MG tablet Take 0.3 mg by mouth 2 (two) times daily.     Marland Kitchen esomeprazole (NEXIUM) 40 MG capsule Take 40 mg by mouth daily.     . Insulin Degludec (TRESIBA FLEXTOUCH [place]) Inject 5 Units into the skin.    Marland Kitchen lidocaine-prilocaine (EMLA) cream Apply 1 application topically as needed. Apply to port site prior when needed for chemo, labs or port  flushes. 30 g 2  . LORazepam (ATIVAN) 0.5 MG tablet Take 0.5 mg by mouth daily as needed.  0  . nebivolol (BYSTOLIC) 10 MG tablet Take 10 mg by mouth daily.     . NERLYNX 40 MG tablet TAKE 6 TABLETS (240MG) BY MOUTH ONE TIME DAILY WITH FOOD. 180 tablet 0  . ondansetron (ZOFRAN) 8 MG tablet Take 1 tablet (8 mg total) by mouth every 8 (eight) hours as needed for nausea or vomiting. 30 tablet 2  . PARoxetine (PAXIL) 20 MG tablet Take 20 mg by mouth daily.     . pioglitazone (ACTOS) 30 MG tablet Take 15 mg by mouth 1 day or 1 dose.    . pravastatin (PRAVACHOL) 20 MG tablet Reported on 08/30/2015    . prochlorperazine (COMPAZINE) 10 MG tablet Take 1 tablet (10 mg total) by mouth every 6 (six) hours as needed for nausea or vomiting. 30 tablet 2  . traZODone (DESYREL) 50 MG tablet Take 100 mg by mouth at bedtime.     Marland Kitchen VITAMIN B COMPLEX-C PO Take by mouth.     No current facility-administered medications for this visit.    Review of Systems  Constitutional: Negative for weight loss.  Eyes: Negative.   Respiratory: Negative.   Cardiovascular: Negative.   Gastrointestinal: Negative for diarrhea, nausea and vomiting.  Genitourinary: Negative.   Musculoskeletal: Negative.   Skin: Negative.   Neurological: Negative.   Endo/Heme/Allergies: Negative.   Psychiatric/Behavioral: The patient is not nervous/anxious.   All other systems reviewed and are negative. 14 point ROS was done and is otherwise as detailed above or in HPI  PHYSICAL EXAMINATION: ECOG PERFORMANCE STATUS: 1 - Symptomatic  but completely ambulatory  Vitals:   08/31/16 1122  BP: (!) 160/71  Pulse: 72  Resp: 18  Temp: 98.7 F (37.1 C)   Filed Weights   08/31/16 1122  Weight: 177 lb 8 oz (80.5 kg)   Physical Exam  Constitutional: She is oriented to person, place, and time and well-developed, well-nourished, and in no distress. No distress.  HENT:  Head: Normocephalic and atraumatic.  Nose: Nose normal.  Mouth/Throat: Oropharynx is clear and moist. No oropharyngeal exudate.  Eyes: Conjunctivae and EOM are normal. Pupils are equal, round, and reactive to light. Right eye exhibits no discharge. Left eye exhibits no discharge. No scleral icterus.  Neck: Normal range of motion. Neck supple. No JVD present. No tracheal deviation present. No thyromegaly present.  Cardiovascular: Normal rate, regular rhythm and normal heart sounds.  Exam reveals no gallop and no friction rub.   No murmur heard. Pulmonary/Chest: Effort normal and breath sounds normal. No respiratory distress. She has no wheezes. She has no rales.  Abdominal: Soft. Bowel sounds are normal. She exhibits no distension and no mass. There is no tenderness. There is no rebound and no guarding.  Musculoskeletal: Normal range of motion. She exhibits no edema or tenderness.  Lymphadenopathy:    She has no cervical adenopathy.  Neurological: She is alert and oriented to person, place, and time. No cranial nerve deficit.  Gait slow  Skin: Skin is warm and dry. No rash noted. No erythema. No pallor.  Psychiatric: Mood, memory, affect and judgment normal.  Nursing note and vitals reviewed.  LABORATORY DATA:  I have reviewed the data as listed Lab Results  Component Value Date   WBC 7.2 08/31/2016   HGB 8.9 (L) 08/31/2016   HCT 27.9 (L) 08/31/2016   MCV 95.2 08/31/2016   PLT 284 08/31/2016  CMP     Component Value Date/Time   NA 140 08/31/2016 1148   K 4.1 08/31/2016 1148   CL 104 08/31/2016 1148   CO2 26 08/31/2016 1148   GLUCOSE 144 (H)  08/31/2016 1148   BUN 29 (H) 08/31/2016 1148   CREATININE 2.70 (H) 08/31/2016 1148   CALCIUM 8.6 (L) 08/31/2016 1148   PROT 7.1 08/31/2016 1148   ALBUMIN 3.1 (L) 08/31/2016 1148   AST 19 08/31/2016 1148   ALT 12 (L) 08/31/2016 1148   ALKPHOS 71 08/31/2016 1148   BILITOT 0.4 08/31/2016 1148   GFRNONAA 16 (L) 08/31/2016 1148   GFRAA 18 (L) 08/31/2016 1148   RADIOGRAPHIC STUDIES: I have personally reviewed the radiological images as listed and agreed with the findings in the report.  DG Bone Survey Met 05/13/2016 IMPRESSION: 1. Asymmetric and abnormal bone mineralization in the right hemipelvis about the acetabulum and inferior pubic ramus, but favor benign etiology such as Paget's disease. Nuclear Medicine Whole-body Bone Scan or noncontrast CT Right Hip correlation may be confirmatory. 2. Normal bone mineralization elsewhere throughout the visible skeleton. Mild L1 compression fracture is new since 2009 but appears chronic and benign. 3. Calcified aortic atherosclerosis. Fibroid uterus. Cholelithiasis.  ASSESSMENT & PLAN:  1. Stage I ER+ PR+ HER 2 + carcinoma of R breast, upper outer quadrant R mastectomy Arimidex Herceptin Anemia CKD, Stage III DEXA with osteopenia 05/2015 Last 2-D echo 05/2015 Lymphedema  L breast screening mammogram due 08/2016  -She is to continue arimidex, calcium and vitamin D. Continue nerylynx.  -Port flush today.  2. IgA gammopathy with M-spike:  -IgA elevated at 1216; M-spike detectable at 0.7.   Regarding her IgA gammopathy, patient has refused bone marrow biopsy for further workup. She states she discussed it with her nephrologist who told her he will monitor her labs instead.  I have ordered follow up labs to monitor her IgA gammopathy on her next visit.  Orders Placed This Encounter  Procedures  . CBC with Differential    Standing Status:   Future    Number of Occurrences:   1    Standing Expiration Date:   08/31/2017  . Comprehensive  metabolic panel    Standing Status:   Future    Number of Occurrences:   1    Standing Expiration Date:   08/31/2017  . CBC with Differential    Standing Status:   Future    Standing Expiration Date:   08/31/2017  . Comprehensive metabolic panel    Standing Status:   Future    Standing Expiration Date:   08/31/2017  . Protein electrophoresis, serum    Standing Status:   Future    Standing Expiration Date:   08/31/2017  . Beta 2 microglobuline, serum    Standing Status:   Future    Standing Expiration Date:   08/31/2017  . Kappa/lambda light chains    Standing Status:   Future    Standing Expiration Date:   08/31/2017    Dispo: She will return for follow up in 8 weeks with labs and also to get her port flush.   All questions were answered. The patient knows to call the clinic with any problems, questions or concerns.   This note was electronically signed.  Twana First, MD  08/31/2016 12:39 PM

## 2016-09-17 SURGERY — ESOPHAGOGASTRODUODENOSCOPY (EGD) WITH PROPOFOL
Anesthesia: Monitor Anesthesia Care

## 2016-09-22 ENCOUNTER — Other Ambulatory Visit (HOSPITAL_COMMUNITY): Payer: Self-pay | Admitting: Oncology

## 2016-09-22 DIAGNOSIS — C50919 Malignant neoplasm of unspecified site of unspecified female breast: Secondary | ICD-10-CM

## 2016-09-22 DIAGNOSIS — C50911 Malignant neoplasm of unspecified site of right female breast: Secondary | ICD-10-CM

## 2016-09-22 MED ORDER — NERATINIB MALEATE 40 MG PO TABS: 240.0000 mg | ORAL_TABLET | Freq: Every day | ORAL | 0 refills | Status: DC

## 2016-10-27 ENCOUNTER — Encounter (HOSPITAL_BASED_OUTPATIENT_CLINIC_OR_DEPARTMENT_OTHER): Payer: Medicare Other | Admitting: Oncology

## 2016-10-27 ENCOUNTER — Encounter (HOSPITAL_COMMUNITY): Payer: Self-pay | Admitting: Oncology

## 2016-10-27 ENCOUNTER — Encounter (HOSPITAL_COMMUNITY): Payer: Medicare Other | Attending: Oncology

## 2016-10-27 VITALS — BP 162/72 | HR 71 | Resp 20 | Ht 63.0 in | Wt 178.0 lb

## 2016-10-27 DIAGNOSIS — C50411 Malignant neoplasm of upper-outer quadrant of right female breast: Secondary | ICD-10-CM | POA: Diagnosis not present

## 2016-10-27 DIAGNOSIS — D649 Anemia, unspecified: Secondary | ICD-10-CM

## 2016-10-27 DIAGNOSIS — M810 Age-related osteoporosis without current pathological fracture: Secondary | ICD-10-CM

## 2016-10-27 DIAGNOSIS — C50911 Malignant neoplasm of unspecified site of right female breast: Secondary | ICD-10-CM

## 2016-10-27 DIAGNOSIS — D472 Monoclonal gammopathy: Secondary | ICD-10-CM | POA: Diagnosis not present

## 2016-10-27 DIAGNOSIS — N183 Chronic kidney disease, stage 3 (moderate): Secondary | ICD-10-CM

## 2016-10-27 LAB — CBC WITH DIFFERENTIAL/PLATELET
BASOS ABS: 0 10*3/uL (ref 0.0–0.1)
Basophils Relative: 0 %
Eosinophils Absolute: 0.3 10*3/uL (ref 0.0–0.7)
Eosinophils Relative: 5 %
HEMATOCRIT: 28.1 % — AB (ref 36.0–46.0)
Hemoglobin: 9.1 g/dL — ABNORMAL LOW (ref 12.0–15.0)
LYMPHS ABS: 0.8 10*3/uL (ref 0.7–4.0)
LYMPHS PCT: 14 %
MCH: 30.8 pg (ref 26.0–34.0)
MCHC: 32.4 g/dL (ref 30.0–36.0)
MCV: 95.3 fL (ref 78.0–100.0)
MONO ABS: 0.3 10*3/uL (ref 0.1–1.0)
Monocytes Relative: 5 %
NEUTROS ABS: 4.5 10*3/uL (ref 1.7–7.7)
Neutrophils Relative %: 77 %
Platelets: 260 10*3/uL (ref 150–400)
RBC: 2.95 MIL/uL — AB (ref 3.87–5.11)
RDW: 16 % — AB (ref 11.5–15.5)
WBC: 5.9 10*3/uL (ref 4.0–10.5)

## 2016-10-27 LAB — COMPREHENSIVE METABOLIC PANEL
ALT: 10 U/L — AB (ref 14–54)
AST: 19 U/L (ref 15–41)
Albumin: 3.2 g/dL — ABNORMAL LOW (ref 3.5–5.0)
Alkaline Phosphatase: 67 U/L (ref 38–126)
Anion gap: 9 (ref 5–15)
BILIRUBIN TOTAL: 0.3 mg/dL (ref 0.3–1.2)
BUN: 32 mg/dL — ABNORMAL HIGH (ref 6–20)
CO2: 26 mmol/L (ref 22–32)
CREATININE: 2.81 mg/dL — AB (ref 0.44–1.00)
Calcium: 8.8 mg/dL — ABNORMAL LOW (ref 8.9–10.3)
Chloride: 106 mmol/L (ref 101–111)
GFR calc Af Amer: 17 mL/min — ABNORMAL LOW (ref >60)
GFR, EST NON AFRICAN AMERICAN: 15 mL/min — AB (ref >60)
Glucose, Bld: 130 mg/dL — ABNORMAL HIGH (ref 65–99)
Potassium: 3.9 mmol/L (ref 3.5–5.1)
Sodium: 141 mmol/L (ref 135–145)
TOTAL PROTEIN: 7.3 g/dL (ref 6.5–8.1)

## 2016-10-27 MED ORDER — SODIUM CHLORIDE 0.9% FLUSH
10.0000 mL | INTRAVENOUS | Status: DC | PRN
  Administered 2016-10-27: 10 mL via INTRAVENOUS
  Filled 2016-10-27: qty 10

## 2016-10-27 MED ORDER — HEPARIN SOD (PORK) LOCK FLUSH 100 UNIT/ML IV SOLN
500.0000 [IU] | Freq: Once | INTRAVENOUS | Status: AC
  Administered 2016-10-27: 500 [IU] via INTRAVENOUS

## 2016-10-27 NOTE — Patient Instructions (Signed)
[place] at [facility] Discharge Instructions  RECOMMENDATIONS MADE BY THE CONSULTANT AND ANY TEST RESULTS WILL BE SENT TO YOUR REFERRING PHYSICIAN.  Port a cath flush done Follow up as scheduled.  Thank you for choosing [place] at [facility] to provide your oncology and hematology care.  To afford each patient quality time with our provider, please arrive at least 15 minutes before your scheduled appointment time.    If you have a lab appointment with the [facility] please come in thru the  Main Entrance and check in at the main information desk  You need to re-schedule your appointment should you arrive 10 or more minutes late.  We strive to give you quality time with our providers, and arriving late affects you and other patients whose appointments are after yours.  Also, if you no show three or more times for appointments you may be dismissed from the clinic at the providers discretion.     Again, thank you for choosing [facility].  Our hope is that these requests will decrease the amount of time that you wait before being seen by our physicians.       _____________________________________________________________  Should you have questions after your visit to [facility], please contact our office at [phone] between the hours of 8:30 a.m. and 4:30 p.m.  Voicemails left after 4:30 p.m. will not be returned until the following business day.  For prescription refill requests, have your pharmacy contact our office.       Resources For Cancer Patients and their Caregivers ? American Cancer Society: Can assist with transportation, wigs, general needs, runs Look Good Feel Better.        807-533-5914 ? Cancer Care: Provides financial assistance, online support groups, medication/co-pay assistance.  1-800-813-HOPE 367-215-7157) ? [place] Assists [place] cancer patients and  their families through emotional , educational and financial support.  646-007-4988 ? [place] DSS Where to apply for food stamps, Medicaid and utility assistance. 604-324-4275 ? [facility]: Transportation to medical appointments. 816-143-6711 ? Social Security Administration: May apply for disability if have a Stage IV cancer. 971 198 1737 (702)301-9477 ? [employer], Disability and Transit Services: Assists with nutrition, care and transit needs. [place] Support Programs: @10RELATIVEDAYS @ > Cancer Support Group  2nd Tuesday of the month 1pm-2pm, Journey Room  > Creative Journey  3rd Tuesday of the month 1130am-1pm, Journey Room  > Look Good Feel Better  1st Wednesday of the month 10am-12 noon, Journey Room (Call [facility] to register [phone])

## 2016-10-27 NOTE — Progress Notes (Signed)
Carly Wood presented for Portacath access and flush. Portacath located left chest wall accessed with  H 20 needle. Good blood return present. Portacath flushed with 85ml NS and 500U/78ml Heparin and needle removed intact. Procedure without incident. Patient tolerated procedure well.

## 2016-10-27 NOTE — Progress Notes (Signed)
[place]  Progress Note  CHIEF COMPLAINTS:    Stage 1 infiltrating ductal carcinoma of right female breast ([place])   09/12/2014 Mammogram    Mass in upper outer R breast, middle third depth appears slightly larger than it was on prior exam with more irreg spiculated margins      10/02/2014 Pathology Results    biopsy with invasive ductal carcinoma high grade 1.1 cm, dcis solid type. additional R breast tissue, excision, invasive ductal high grade 0.9 cm       10/24/2014 Pathology Results    no residual invasive carcinoma, DCIS, focal, atypical ductal hyperplasia, 0/7 LN positive for metastatic carcinoma ER > 90%, PR 30%, HER 2 2+      10/24/2014 Cancer Staging    [id number]      10/24/2014 Surgery    R mastectomy, T1c, N0      12/28/2014 - 11/22/2015 Chemotherapy    Taxol/herceptin weekly X 12, Herceptin every 21 days, last due in September 2017      03/11/2015 -  Anti-estrogen oral therapy    Arimidex 1 mg daily      09/12/2015 Imaging    MUGA- The left ventricular ejection fraction equals 68%.      06/08/2016 Treatment Plan Change    Started Nerlynx      HISTORY OF PRESENTING ILLNESS:  Carly Wood 81 y.o. female is here for ongoing care for a stage I ER+ PR+ HER 2 + carcinoma of the R breast with her sister.   Patient states she is tolerating nerlynx well. She denies any associated nausea, vomiting, diarrhea. She states her appetite is good and she is sleeping well. She denies any recent weight loss. Sister who she lives with states that the patient has been doing well. She has no complaints today. Since her last visit she has been placed on calcitriol and a daily iron tablet by her nephrologist. Otherwise there has been no new changes in her medications.  MEDICAL HISTORY:  Past Medical History:  Diagnosis Date  . Anemia   . Anxiety   . Chronic kidney disease   . Chronic renal disease, stage 4, severely decreased glomerular filtration rate (GFR) between  15-29 mL/min/1.73 square meter (HCC) 08/22/2015  . Depression   . Diabetes mellitus without complication ([place])   . Hypertension   . Stage 1 infiltrating ductal carcinoma of right female breast ([place]) 08/21/2015   ER+ PR+ HER 2 neu + (3+) T1cN0     SURGICAL HISTORY: Past Surgical History:  Procedure Laterality Date  . BIOPSY  08/07/2016   Procedure: BIOPSY;  Surgeon: Daneil Dolin, MD;  [facility];  Service: Endoscopy;;  gastric ulcer biopsy  . ESOPHAGOGASTRODUODENOSCOPY N/A 08/07/2016   Procedure: ESOPHAGOGASTRODUODENOSCOPY (EGD);  Surgeon: Daneil Dolin, MD;  [facility];  Service: Endoscopy;  Laterality: N/A;  1215-rescheduled to 6/1 @ 2:30pm per Tretha Sciara  . MALONEY DILATION N/A 08/07/2016   Procedure: Venia Minks DILATION;  Surgeon: Daneil Dolin, MD;  [facility];  Service: Endoscopy;  Laterality: N/A;  . MASTECTOMY, PARTIAL    . RETINAL DETACHMENT SURGERY Right     SOCIAL HISTORY: Social History   Social History  . Marital status: Single    Spouse name: N/A  . Number of children: N/A  . Years of education: N/A   Occupational History  . Not on file.   Social History Main Topics  . Smoking status: Never Smoker  . Smokeless tobacco:  Never Used  . Alcohol use No  . Drug use: No  . Sexual activity: Not on file   Other Topics Concern  . Not on file   Social History Narrative  . No narrative on file   Carly Wood, the patient, never married; says she never wanted to. She remarks that this was a choice she made; "not that I didn't have the opportunities, I just made the choice not to do it." NO tobacco, cigarettes or chewing tobacco. No ETOH  The 3 sisters live together. Deneise Lever does all the cooking.  She has nieces and nephews; one teaches school in [place]. Pilar Plate is her oldest brother; he was a principal. Has retired. Pilar Plate has a wife who was a Pharmacist, hospital; they have 3 children, including a son who graduated from [employer], and a daughter who  graduated from [employer] who is a Sport and exercise psychologist, and another daughter who has a PhD in education. Harle Battiest is her "favorite niece." Martin Majestic to college and is teaching something to do with drafting.  The patient, Carly Wood, was a good Ship broker; graduated high school and college. 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 Taught in [place], [place].  She notes she had hobbies; used to do artwork. Drawing pictures, mostly flowers, houses. Pen and pencil, colored pencils.  FAMILY HISTORY: Family History  Problem Relation Age of Onset  . Multiple myeloma Sister   . Brain cancer Sister   . Dementia Mother        died at 16  . Stroke Mother   . Heart failure Mother   . Diabetes Mother   . Heart disease Father   . Prostate cancer Brother   . 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  Father was 96 when he died; heart disease. Mother was 4 when she died. Diabetic, with congestive heart failure. Had a stroke as well. Sister Deneise Lever was caregiver for their mother for 10 years after her stroke. Her sister Wilford Sports has multiple myeloma and also had a CNS lymphoma, and notes that she is on Revlamid. Wilford Sports is 16 years out from her brain surgery.  7 brothers and sisters total; one is deceased. 6 siblings left. 1 that passed died of prostate cancer. "He lasted 12 years."  ALLERGIES:  is allergic to penicillins; ace inhibitors; and lisinopril.  MEDICATIONS:  Current Outpatient Prescriptions  Medication Sig Dispense Refill  . acetaminophen (TYLENOL) 500 MG tablet Take 500 mg by mouth every 6 (six) hours as needed.     Marland Kitchen amLODipine (NORVASC) 5 MG tablet Take 5 mg by mouth daily.     Marland Kitchen  anastrozole (ARIMIDEX) 1 MG tablet Take 1 tablet (1 mg total) by mouth daily. 90 tablet 3  . aspirin 81 MG chewable tablet Chew by mouth.    . calcitRIOL (ROCALTROL) 0.25 MCG capsule Take 0.25 mcg by mouth daily.    . cloNIDine (CATAPRES) 0.3 MG tablet Take 0.3 mg by mouth 2 (two) times daily.     . Insulin Degludec (TRESIBA FLEXTOUCH [place]) Inject 5 Units into the skin.    Marland Kitchen iron polysaccharides (NIFEREX) 150 MG capsule Take 150  mg by mouth daily.    Marland Kitchen lidocaine-prilocaine (EMLA) cream Apply 1 application topically as needed. Apply to port site prior when needed for chemo, labs or port flushes. 30 g 2  . nebivolol (BYSTOLIC) 10 MG tablet Take 10 mg by mouth daily.     . Neratinib Maleate (NERLYNX) 40 MG tablet Take 6 tablets (240 mg total) by mouth daily. Take with food. 180 tablet 0  . NERLYNX 40 MG tablet TAKE 6 TABLETS ('240MG'$ ) BY MOUTH ONE TIME DAILY WITH FOOD. 180 tablet 0  . ondansetron (ZOFRAN) 8 MG tablet Take 1 tablet (8 mg total) by mouth every 8 (eight) hours as needed for nausea or vomiting. 30 tablet 2  . PARoxetine (PAXIL) 20 MG tablet Take 20 mg by mouth daily.     . pioglitazone (ACTOS) 30 MG tablet Take 15 mg by mouth 1 day or 1 dose.    . pravastatin (PRAVACHOL) 20 MG tablet Reported on 08/30/2015    . prochlorperazine (COMPAZINE) 10 MG tablet Take 1 tablet (10 mg total) by mouth every 6 (six) hours as needed for nausea or vomiting. 30 tablet 2  . traZODone (DESYREL) 50 MG tablet Take 100 mg by mouth at bedtime.      No current facility-administered medications for this visit.    Facility-Administered Medications Ordered in Other Visits  Medication Dose Route Frequency Provider Last Rate Last Dose  . sodium chloride flush (NS) 0.9 % injection 10 mL  10 mL Intravenous PRN Holley Bouche, NP   10 mL at 10/27/16 1056   Review of Systems  Constitutional: Negative for weight loss.  Eyes: Negative.   Respiratory: Negative.   Cardiovascular: Negative.   Gastrointestinal:  Negative for diarrhea, nausea and vomiting.  Genitourinary: Negative.   Musculoskeletal: Negative.   Skin: Negative.   Neurological: Negative.   Endo/Heme/Allergies: Negative.   Psychiatric/Behavioral: The patient is not nervous/anxious.   All other systems reviewed and are negative. 14 point ROS was done and is otherwise as detailed above or in HPI  PHYSICAL EXAMINATION: ECOG PERFORMANCE STATUS: 1 - Symptomatic but completely ambulatory  Vitals:   10/27/16 1044  BP: (!) 162/72  Pulse: 71  Resp: 20  SpO2: 100%   Filed Weights   10/27/16 1044  Weight: 178 lb (80.7 kg)   Physical Exam  Constitutional: She is oriented to person, place, and time and well-developed, well-nourished, and in no distress. No distress.  HENT:  Head: Normocephalic and atraumatic.  Nose: Nose normal.  Mouth/Throat: Oropharynx is clear and moist. No oropharyngeal exudate.  Eyes: Pupils are equal, round, and reactive to light. Conjunctivae and EOM are normal. Right eye exhibits no discharge. Left eye exhibits no discharge. No scleral icterus.  Neck: Normal range of motion. Neck supple. No JVD present. No tracheal deviation present. No thyromegaly present.  Cardiovascular: Normal rate, regular rhythm and normal heart sounds.  Exam reveals no gallop and no friction rub.   No murmur heard. Pulmonary/Chest: Effort normal and breath sounds normal. No respiratory distress. She has no wheezes. She has no rales.  Abdominal: Soft. Bowel sounds are normal. She exhibits no distension and no mass. There is no tenderness. There is no rebound and no guarding.  Musculoskeletal: Normal range of motion. She exhibits no edema or tenderness.  Lymphadenopathy:    She has no cervical adenopathy.  Neurological: She is alert and oriented to person, place, and time. No cranial nerve deficit.  Gait slow  Skin: Skin is warm and dry. No  rash noted. No erythema. No pallor.  Psychiatric: Mood, memory, affect and judgment normal.    Nursing note and vitals reviewed.  LABORATORY DATA:  I have reviewed the data as listed Lab Results  Component Value Date   WBC 7.2 08/31/2016   HGB 8.9 (L) 08/31/2016   HCT 27.9 (L) 08/31/2016   MCV 95.2 08/31/2016   PLT 284 08/31/2016   CMP     Component Value Date/Time   NA 140 08/31/2016 1148   K 4.1 08/31/2016 1148   CL 104 08/31/2016 1148   CO2 26 08/31/2016 1148   GLUCOSE 144 (H) 08/31/2016 1148   BUN 29 (H) 08/31/2016 1148   CREATININE 2.70 (H) 08/31/2016 1148   CALCIUM 8.6 (L) 08/31/2016 1148   PROT 7.1 08/31/2016 1148   ALBUMIN 3.1 (L) 08/31/2016 1148   AST 19 08/31/2016 1148   ALT 12 (L) 08/31/2016 1148   ALKPHOS 71 08/31/2016 1148   BILITOT 0.4 08/31/2016 1148   GFRNONAA 16 (L) 08/31/2016 1148   GFRAA 18 (L) 08/31/2016 1148   RADIOGRAPHIC STUDIES: I have personally reviewed the radiological images as listed and agreed with the findings in the report.  DG Bone Survey Met 05/13/2016 IMPRESSION: 1. Asymmetric and abnormal bone mineralization in the right hemipelvis about the acetabulum and inferior pubic ramus, but favor benign etiology such as Paget's disease. Nuclear Medicine Whole-body Bone Scan or noncontrast CT Right Hip correlation may be confirmatory. 2. Normal bone mineralization elsewhere throughout the visible skeleton. Mild L1 compression fracture is new since 2009 but appears chronic and benign. 3. Calcified aortic atherosclerosis. Fibroid uterus. Cholelithiasis.  ASSESSMENT & PLAN:  1. Stage I ER+ PR+ HER 2 + carcinoma of R breast, upper outer quadrant R mastectomy Arimidex Herceptin Anemia CKD, Stage III DEXA with osteopenia 05/2015 Last 2-D echo 05/2015 Lymphedema  L breast screening mammogram due 08/2016   -She is to continue arimidex, calcium and vitamin D. Continue nerylynx, which she is tolerating well.  -Port flush today.  2. IgA gammopathy with M-spike:  -IgA elevated at 1216; M-spike detectable at 0.7.   Regarding her IgA  gammopathy, patient has refused bone marrow biopsy for further workup. She states she discussed it with her nephrologist who told her he will monitor her labs instead.  I have ordered follow up labs to monitor her IgA gammopathy on her next visit. Her labs from today are pending.  Orders Placed This Encounter  Procedures  . CBC with Differential    Standing Status:   Future    Standing Expiration Date:   10/27/2017  . Comprehensive metabolic panel    Standing Status:   Future    Standing Expiration Date:   10/27/2017  . Beta 2 microglobuline, serum    Standing Status:   Future    Standing Expiration Date:   10/27/2017  . Multiple Myeloma Panel (SPEP&IFE w/QIG)    Standing Status:   Future    Standing Expiration Date:   10/27/2017  . Kappa/lambda light chains    Standing Status:   Future    Standing Expiration Date:   10/27/2017  . Iron and TIBC    Standing Status:   Future    Standing Expiration Date:   10/27/2017  . Ferritin    Standing Status:   Future    Standing Expiration Date:   10/27/2017    Dispo: She will return for follow up in 12  weeks with labs and also to get her port flush.   All  questions were answered. The patient knows to call the clinic with any problems, questions or concerns.   This note was electronically signed.  Twana First, MD  10/27/2016 11:15 AM

## 2016-10-28 LAB — PROTEIN ELECTROPHORESIS, SERUM
A/G Ratio: 0.9 (ref 0.7–1.7)
ALBUMIN ELP: 3.2 g/dL (ref 2.9–4.4)
ALPHA-1-GLOBULIN: 0.2 g/dL (ref 0.0–0.4)
ALPHA-2-GLOBULIN: 0.7 g/dL (ref 0.4–1.0)
BETA GLOBULIN: 1.6 g/dL — AB (ref 0.7–1.3)
GAMMA GLOBULIN: 0.9 g/dL (ref 0.4–1.8)
Globulin, Total: 3.4 g/dL (ref 2.2–3.9)
M-SPIKE, %: 0.9 g/dL — AB
TOTAL PROTEIN ELP: 6.6 g/dL (ref 6.0–8.5)

## 2016-10-28 LAB — KAPPA/LAMBDA LIGHT CHAINS
KAPPA FREE LGHT CHN: 84.5 mg/L — AB (ref 3.3–19.4)
Kappa, lambda light chain ratio: 0.18 — ABNORMAL LOW (ref 0.26–1.65)
Lambda free light chains: 475.2 mg/L — ABNORMAL HIGH (ref 5.7–26.3)

## 2016-10-28 LAB — BETA 2 MICROGLOBULIN, SERUM: BETA 2 MICROGLOBULIN: 7.6 mg/L — AB (ref 0.6–2.4)

## 2016-11-10 ENCOUNTER — Other Ambulatory Visit: Payer: Self-pay

## 2016-11-10 ENCOUNTER — Encounter: Payer: Self-pay | Admitting: Gastroenterology

## 2016-11-10 ENCOUNTER — Ambulatory Visit (INDEPENDENT_AMBULATORY_CARE_PROVIDER_SITE_OTHER): Payer: Medicare Other | Admitting: Gastroenterology

## 2016-11-10 DIAGNOSIS — K259 Gastric ulcer, unspecified as acute or chronic, without hemorrhage or perforation: Secondary | ICD-10-CM | POA: Insufficient documentation

## 2016-11-10 NOTE — Patient Instructions (Signed)
1. Upper endoscopy with Dr. Rourk. See separate instructions.  

## 2016-11-10 NOTE — Assessment & Plan Note (Signed)
81 year old female initially evaluated in May due to dysphagia. Previously evaluated by speech therapy and noted to have oropharyngeal dysphagia, see May 2018 note for details. EGD was performed, she had grade a esophagitis, esophagus was dilated. Multiple 2-3 mm antral prepyloric ulcers noted. Unfortunately we thought she was on Nexium and increase to twice a day but in fact she's not been on PPI since May due to drug interaction Nerlynx (see above). Clinically patient is doing well from GI standpoint.   Due for surveillance egd at this time.  I have discussed the risks, alternatives, benefits with regards to but not limited to the risk of reaction to medication, bleeding, infection, perforation and the patient is agreeable to proceed. Written consent to be obtained.

## 2016-11-10 NOTE — Progress Notes (Signed)
Primary Care Physician: Monico Blitz, MD  Primary Gastroenterologist:  Garfield Cornea, MD   Chief Complaint  Patient presents with  . Dysphagia    HPI: Carly Wood is a 81 y.o. female here for follow-up. EGD recently back in June done for dysphagia. See detailed office visit note dated 07/31/2016 for speech therapy evaluation. At time of EGD she was found have LA grade a esophagitis, no stricture. Small hiatal hernia. Multiple 2-3 mm antral prepyloric ulcers with surrounding erosions. Multiple diffuse erosions in the duodenal bulb. Esophagus was dilated with 59 [place]. Gastric biopsies benign. No H. pylori. Advised to have a three-month surveillance EGD. Her weight has been stable over the past 4 months. She reports good appetite. Her sister report that she's been eating well. Back on regular food. Supplements with nutritional drinks. Denies abdominal pain. Denies heartburn. Bowel function regular. No melena rectal bleeding.  It is brought to my attention now that she is not on Nexium. Her drug list showed Nexium back in May when we saw her. She actually stopped Nexium back in March as per oncology. Medication they prescribed, Nerlynx, as decreased absorption at higher gastric pH.    Current Outpatient Prescriptions  Medication Sig Dispense Refill  . acetaminophen (TYLENOL) 500 MG tablet Take 500 mg by mouth every 6 (six) hours as needed.     Marland Kitchen amLODipine (NORVASC) 5 MG tablet Take 5 mg by mouth daily.     Marland Kitchen anastrozole (ARIMIDEX) 1 MG tablet Take 1 tablet (1 mg total) by mouth daily. 90 tablet 3  . aspirin 81 MG chewable tablet Chew by mouth.    . calcitRIOL (ROCALTROL) 0.25 MCG capsule Take 0.25 mcg by mouth daily.    . cloNIDine (CATAPRES) 0.3 MG tablet Take 0.3 mg by mouth 2 (two) times daily.     . Insulin Degludec (TRESIBA FLEXTOUCH [place]) Inject 5 Units into the skin.    Marland Kitchen iron polysaccharides (NIFEREX) 150 MG capsule Take 150 mg by mouth daily.    Marland Kitchen lidocaine-prilocaine  (EMLA) cream Apply 1 application topically as needed. Apply to port site prior when needed for chemo, labs or port flushes. 30 g 2  . nebivolol (BYSTOLIC) 10 MG tablet Take 10 mg by mouth daily.     . Neratinib Maleate (NERLYNX) 40 MG tablet Take 6 tablets (240 mg total) by mouth daily. Take with food. 180 tablet 0  . NERLYNX 40 MG tablet TAKE 6 TABLETS (240MG) BY MOUTH ONE TIME DAILY WITH FOOD. 180 tablet 0  . ondansetron (ZOFRAN) 8 MG tablet Take 1 tablet (8 mg total) by mouth every 8 (eight) hours as needed for nausea or vomiting. 30 tablet 2  . PARoxetine (PAXIL) 20 MG tablet Take 20 mg by mouth daily.     . pioglitazone (ACTOS) 30 MG tablet Take 15 mg by mouth 1 day or 1 dose.    . pravastatin (PRAVACHOL) 20 MG tablet Reported on 08/30/2015    . prochlorperazine (COMPAZINE) 10 MG tablet Take 1 tablet (10 mg total) by mouth every 6 (six) hours as needed for nausea or vomiting. 30 tablet 2  . traZODone (DESYREL) 50 MG tablet Take 100 mg by mouth at bedtime.      No current facility-administered medications for this visit.     Allergies as of 11/10/2016 - Review Complete 11/10/2016  Allergen Reaction Noted  . Penicillins Other (See Comments) 08/19/2015  . Ace inhibitors Cough and Other (See Comments) 08/19/2015  . Lisinopril Other (  See Comments) 08/19/2015   Past Medical History:  Diagnosis Date  . Anemia   . Anxiety   . Chronic kidney disease   . Chronic renal disease, stage 4, severely decreased glomerular filtration rate (GFR) between 15-29 mL/min/1.73 square meter (HCC) 08/22/2015  . Depression   . Diabetes mellitus without complication ([place])   . Hypertension   . Stage 1 infiltrating ductal carcinoma of right female breast ([place]) 08/21/2015   ER+ PR+ HER 2 neu + (3+) T1cN0    Past Surgical History:  Procedure Laterality Date  . BIOPSY  08/07/2016   Procedure: BIOPSY;  Surgeon: Daneil Dolin, MD;  [facility];  Service: Endoscopy;;  gastric ulcer biopsy  .  ESOPHAGOGASTRODUODENOSCOPY N/A 08/07/2016   Procedure: ESOPHAGOGASTRODUODENOSCOPY (EGD);  Surgeon: Daneil Dolin, MD;  [facility];  Service: Endoscopy;  Laterality: N/A;  1215-rescheduled to 6/1 @ 2:30pm per Tretha Sciara  . MALONEY DILATION N/A 08/07/2016   Procedure: Venia Minks DILATION;  Surgeon: Daneil Dolin, MD;  [facility];  Service: Endoscopy;  Laterality: N/A;  . MASTECTOMY, PARTIAL    . RETINAL DETACHMENT SURGERY Right    Family History  Problem Relation Age of Onset  . Multiple myeloma Sister   . Brain cancer Sister   . Dementia Mother        died at 58  . Stroke Mother   . Heart failure Mother   . Diabetes Mother   . Heart disease Father   . Prostate cancer Brother   . Colon cancer Neg Hx    Social History  Substance Use Topics  . Smoking status: Never Smoker  . Smokeless tobacco: Never Used  . Alcohol use No    ROS:  General: Negative for anorexia, weight loss, fever, chills, fatigue, weakness. ENT: Negative for hoarseness, difficulty swallowing , nasal congestion. CV: Negative for chest pain, angina, palpitations, dyspnea on exertion, peripheral edema.  Respiratory: Negative for dyspnea at rest, dyspnea on exertion, cough, sputum, wheezing.  GI: See history of present illness. GU:  Negative for dysuria, hematuria, urinary incontinence, urinary frequency, nocturnal urination.  Endo: Negative for unusual weight change.    Physical Examination:   BP (!) 145/83   Pulse 77   Temp (!) 97.4 F (36.3 C) (Oral)   Ht '5\' 3"'  (1.6 m)   Wt 176 lb (79.8 kg)   BMI 31.18 kg/m   General: Well-nourished, well-developed in no acute distress.  Eyes: No icterus. Mouth: Oropharyngeal mucosa moist and pink , no lesions erythema or exudate. Lungs: Clear to auscultation bilaterally.  Heart: Regular rate and rhythm, no murmurs rubs or gallops.  Abdomen: Bowel sounds are normal, nontender, nondistended, no hepatosplenomegaly or masses, no abdominal bruits or  hernia , no rebound or guarding.   Extremities: No lower extremity edema. No clubbing or deformities. Neuro: Alert and oriented x 4   Skin: Warm and dry, no jaundice.   Psych: Alert and cooperative, normal mood and affect.

## 2016-11-10 NOTE — Patient Instructions (Signed)
PA info for EGD submitted via [facility] website. No PA needed. Decision ID# [id number].

## 2016-11-11 NOTE — Progress Notes (Signed)
cc'ed to pcp °

## 2016-11-23 ENCOUNTER — Other Ambulatory Visit (HOSPITAL_COMMUNITY): Payer: Self-pay | Admitting: Emergency Medicine

## 2016-11-23 DIAGNOSIS — C50911 Malignant neoplasm of unspecified site of right female breast: Secondary | ICD-10-CM

## 2016-11-23 DIAGNOSIS — C50919 Malignant neoplasm of unspecified site of unspecified female breast: Secondary | ICD-10-CM

## 2016-11-23 MED ORDER — NERATINIB MALEATE 40 MG PO TABS: ORAL_TABLET | ORAL | 0 refills | Status: DC

## 2016-11-23 MED ORDER — LOPERAMIDE HCL 2 MG PO TABS: ORAL_TABLET | ORAL | 3 refills | Status: DC

## 2016-11-23 NOTE — Progress Notes (Signed)
nerlynx refilled

## 2016-11-27 ENCOUNTER — Ambulatory Visit (HOSPITAL_COMMUNITY)
Admission: RE | Admit: 2016-11-27 | Discharge: 2016-11-27 | Disposition: A | Discharge status: Home / Self Care | Payer: Medicare Other | Source: Ambulatory Visit | Attending: Internal Medicine | Admitting: Internal Medicine

## 2016-11-27 ENCOUNTER — Encounter (HOSPITAL_COMMUNITY)
Admission: RE | Disposition: A | Discharge status: Home / Self Care | Payer: Self-pay | Source: Ambulatory Visit | Attending: Internal Medicine

## 2016-11-27 ENCOUNTER — Encounter (HOSPITAL_COMMUNITY): Payer: Self-pay | Admitting: *Deleted

## 2016-11-27 DIAGNOSIS — K3189 Other diseases of stomach and duodenum: Secondary | ICD-10-CM | POA: Diagnosis not present

## 2016-11-27 DIAGNOSIS — Z8711 Personal history of peptic ulcer disease: Secondary | ICD-10-CM | POA: Diagnosis not present

## 2016-11-27 DIAGNOSIS — E1122 Type 2 diabetes mellitus with diabetic chronic kidney disease: Secondary | ICD-10-CM | POA: Insufficient documentation

## 2016-11-27 DIAGNOSIS — I129 Hypertensive chronic kidney disease with stage 1 through stage 4 chronic kidney disease, or unspecified chronic kidney disease: Secondary | ICD-10-CM | POA: Diagnosis not present

## 2016-11-27 DIAGNOSIS — Z79899 Other long term (current) drug therapy: Secondary | ICD-10-CM | POA: Diagnosis not present

## 2016-11-27 DIAGNOSIS — N184 Chronic kidney disease, stage 4 (severe): Secondary | ICD-10-CM | POA: Diagnosis not present

## 2016-11-27 DIAGNOSIS — Z7982 Long term (current) use of aspirin: Secondary | ICD-10-CM | POA: Diagnosis not present

## 2016-11-27 DIAGNOSIS — Z794 Long term (current) use of insulin: Secondary | ICD-10-CM | POA: Insufficient documentation

## 2016-11-27 DIAGNOSIS — K259 Gastric ulcer, unspecified as acute or chronic, without hemorrhage or perforation: Secondary | ICD-10-CM

## 2016-11-27 DIAGNOSIS — Z09 Encounter for follow-up examination after completed treatment for conditions other than malignant neoplasm: Secondary | ICD-10-CM | POA: Insufficient documentation

## 2016-11-27 DIAGNOSIS — F329 Major depressive disorder, single episode, unspecified: Secondary | ICD-10-CM | POA: Diagnosis not present

## 2016-11-27 DIAGNOSIS — Z853 Personal history of malignant neoplasm of breast: Secondary | ICD-10-CM | POA: Insufficient documentation

## 2016-11-27 DIAGNOSIS — F419 Anxiety disorder, unspecified: Secondary | ICD-10-CM | POA: Diagnosis not present

## 2016-11-27 HISTORY — PX: ESOPHAGOGASTRODUODENOSCOPY: SHX5428

## 2016-11-27 LAB — GLUCOSE, CAPILLARY: GLUCOSE-CAPILLARY: 122 mg/dL — AB (ref 65–99)

## 2016-11-27 SURGERY — EGD (ESOPHAGOGASTRODUODENOSCOPY)
Anesthesia: Moderate Sedation

## 2016-11-27 MED ORDER — ONDANSETRON HCL 4 MG/2ML IJ SOLN
INTRAMUSCULAR | Status: DC | PRN
Start: 2016-11-27 — End: 2016-11-27
  Administered 2016-11-27: 4 mg via INTRAVENOUS

## 2016-11-27 MED ORDER — SODIUM CHLORIDE 0.9 % IV SOLN
INTRAVENOUS | Status: DC
  Administered 2016-11-27: 1000 mL via INTRAVENOUS

## 2016-11-27 MED ORDER — MIDAZOLAM HCL 5 MG/5ML IJ SOLN
INTRAMUSCULAR | Status: AC
  Filled 2016-11-27: qty 10

## 2016-11-27 MED ORDER — LIDOCAINE VISCOUS 2 % MT SOLN
OROMUCOSAL | Status: AC
  Filled 2016-11-27: qty 15

## 2016-11-27 MED ORDER — MEPERIDINE HCL 100 MG/ML IJ SOLN
INTRAMUSCULAR | Status: DC | PRN
  Administered 2016-11-27: 50 mg via INTRAVENOUS

## 2016-11-27 MED ORDER — ONDANSETRON HCL 4 MG/2ML IJ SOLN
INTRAMUSCULAR | Status: AC
  Filled 2016-11-27: qty 2

## 2016-11-27 MED ORDER — STERILE WATER FOR IRRIGATION IR SOLN
Status: DC | PRN
  Administered 2016-11-27: 08:00:00

## 2016-11-27 MED ORDER — MIDAZOLAM HCL 5 MG/5ML IJ SOLN
INTRAMUSCULAR | Status: DC | PRN
  Administered 2016-11-27: 2 mg via INTRAVENOUS

## 2016-11-27 MED ORDER — LIDOCAINE VISCOUS 2 % MT SOLN
OROMUCOSAL | Status: DC | PRN
  Administered 2016-11-27: 4 mL via OROMUCOSAL

## 2016-11-27 MED ORDER — MEPERIDINE HCL 100 MG/ML IJ SOLN
INTRAMUSCULAR | Status: AC
  Filled 2016-11-27: qty 2

## 2016-11-27 NOTE — Discharge Instructions (Addendum)
Avoid nonsteroidal drugs like ibuprofen and Advil  Follow-up with your primary care physician oncologist.  We will be glad to see you back in the office anytime as needed.  EGD Discharge instructions Please read the instructions outlined below and refer to this sheet in the next few weeks. These discharge instructions provide you with general information on caring for yourself after you leave the hospital. Your doctor may also give you specific instructions. While your treatment has been planned according to the most current medical practices available, unavoidable complications occasionally occur. If you have any problems or questions after discharge, please call your doctor. ACTIVITY  You may resume your regular activity but move at a slower pace for the next 24 hours.   Take frequent rest periods for the next 24 hours.   Walking will help expel (get rid of) the air and reduce the bloated feeling in your abdomen.   No driving for 24 hours (because of the anesthesia (medicine) used during the test).   You may shower.   Do not sign any important legal documents or operate any machinery for 24 hours (because of the anesthesia used during the test).  NUTRITION  Drink plenty of fluids.   You may resume your normal diet.   Begin with a light meal and progress to your normal diet.   Avoid alcoholic beverages for 24 hours or as instructed by your caregiver.  MEDICATIONS  You may resume your normal medications unless your caregiver tells you otherwise.  WHAT YOU CAN EXPECT TODAY  You may experience abdominal discomfort such as a feeling of fullness or gas pains.  FOLLOW-UP  Your doctor will discuss the results of your test with you.  SEEK IMMEDIATE MEDICAL ATTENTION IF ANY OF THE FOLLOWING OCCUR:  Excessive nausea (feeling sick to your stomach) and/or vomiting.   Severe abdominal pain and distention (swelling).   Trouble swallowing.   Temperature over 101 F (37.8 C).    Rectal bleeding or vomiting of blood.

## 2016-11-27 NOTE — Interval H&P Note (Signed)
History and Physical Interval Note:  11/27/2016 8:19 AM  Carly Wood  has presented today for surgery, with the diagnosis of gastric ulcers  The various methods of treatment have been discussed with the patient and family. After consideration of risks, benefits and other options for treatment, the patient has consented to  Procedure(s) with comments: ESOPHAGOGASTRODUODENOSCOPY (EGD) (N/A) - 8:15am as a surgical intervention .  The patient's history has been reviewed, patient examined, no change in status, stable for surgery.  I have reviewed the patient's chart and labs.  Questions were answered to the patient's satisfaction.    Patient without complaints specifically no GI complaints no dysphagia or abdominal pain. Surveillance EGD per plan. The risks, benefits, limitations, alternatives and imponderables have been reviewed with the patient. Potential for esophageal dilation, biopsy, etc. have also been reviewed.  Questions have been answered. All parties agreeable. Manus Rudd

## 2016-11-27 NOTE — H&P (View-Only) (Signed)
Primary Care Physician: Monico Blitz, MD  Primary Gastroenterologist:  Garfield Cornea, MD   Chief Complaint  Patient presents with  . Dysphagia    HPI: Carly Wood is a 81 y.o. female here for follow-up. EGD recently back in June done for dysphagia. See detailed office visit note dated 07/31/2016 for speech therapy evaluation. At time of EGD she was found have LA grade a esophagitis, no stricture. Small hiatal hernia. Multiple 2-3 mm antral prepyloric ulcers with surrounding erosions. Multiple diffuse erosions in the duodenal bulb. Esophagus was dilated with 74 [place]. Gastric biopsies benign. No H. pylori. Advised to have a three-month surveillance EGD. Her weight has been stable over the past 4 months. She reports good appetite. Her sister report that she's been eating well. Back on regular food. Supplements with nutritional drinks. Denies abdominal pain. Denies heartburn. Bowel function regular. No melena rectal bleeding.  It is brought to my attention now that she is not on Nexium. Her drug list showed Nexium back in May when we saw her. She actually stopped Nexium back in March as per oncology. Medication they prescribed, Nerlynx, as decreased absorption at higher gastric pH.    Current Outpatient Prescriptions  Medication Sig Dispense Refill  . acetaminophen (TYLENOL) 500 MG tablet Take 500 mg by mouth every 6 (six) hours as needed.     Marland Kitchen amLODipine (NORVASC) 5 MG tablet Take 5 mg by mouth daily.     Marland Kitchen anastrozole (ARIMIDEX) 1 MG tablet Take 1 tablet (1 mg total) by mouth daily. 90 tablet 3  . aspirin 81 MG chewable tablet Chew by mouth.    . calcitRIOL (ROCALTROL) 0.25 MCG capsule Take 0.25 mcg by mouth daily.    . cloNIDine (CATAPRES) 0.3 MG tablet Take 0.3 mg by mouth 2 (two) times daily.     . Insulin Degludec (TRESIBA FLEXTOUCH [place]) Inject 5 Units into the skin.    Marland Kitchen iron polysaccharides (NIFEREX) 150 MG capsule Take 150 mg by mouth daily.    Marland Kitchen lidocaine-prilocaine  (EMLA) cream Apply 1 application topically as needed. Apply to port site prior when needed for chemo, labs or port flushes. 30 g 2  . nebivolol (BYSTOLIC) 10 MG tablet Take 10 mg by mouth daily.     . Neratinib Maleate (NERLYNX) 40 MG tablet Take 6 tablets (240 mg total) by mouth daily. Take with food. 180 tablet 0  . NERLYNX 40 MG tablet TAKE 6 TABLETS (240MG) BY MOUTH ONE TIME DAILY WITH FOOD. 180 tablet 0  . ondansetron (ZOFRAN) 8 MG tablet Take 1 tablet (8 mg total) by mouth every 8 (eight) hours as needed for nausea or vomiting. 30 tablet 2  . PARoxetine (PAXIL) 20 MG tablet Take 20 mg by mouth daily.     . pioglitazone (ACTOS) 30 MG tablet Take 15 mg by mouth 1 day or 1 dose.    . pravastatin (PRAVACHOL) 20 MG tablet Reported on 08/30/2015    . prochlorperazine (COMPAZINE) 10 MG tablet Take 1 tablet (10 mg total) by mouth every 6 (six) hours as needed for nausea or vomiting. 30 tablet 2  . traZODone (DESYREL) 50 MG tablet Take 100 mg by mouth at bedtime.      No current facility-administered medications for this visit.     Allergies as of 11/10/2016 - Review Complete 11/10/2016  Allergen Reaction Noted  . Penicillins Other (See Comments) 08/19/2015  . Ace inhibitors Cough and Other (See Comments) 08/19/2015  . Lisinopril Other (  See Comments) 08/19/2015   Past Medical History:  Diagnosis Date  . Anemia   . Anxiety   . Chronic kidney disease   . Chronic renal disease, stage 4, severely decreased glomerular filtration rate (GFR) between 15-29 mL/min/1.73 square meter (HCC) 08/22/2015  . Depression   . Diabetes mellitus without complication ([place])   . Hypertension   . Stage 1 infiltrating ductal carcinoma of right female breast ([place]) 08/21/2015   ER+ PR+ HER 2 neu + (3+) T1cN0    Past Surgical History:  Procedure Laterality Date  . BIOPSY  08/07/2016   Procedure: BIOPSY;  Surgeon: Daneil Dolin, MD;  [facility]: [facility];  Service: Endoscopy;;  gastric ulcer biopsy  .  ESOPHAGOGASTRODUODENOSCOPY N/A 08/07/2016   Procedure: ESOPHAGOGASTRODUODENOSCOPY (EGD);  Surgeon: Daneil Dolin, MD;  Location: [facility];  Service: Endoscopy;  Laterality: N/A;  1215-rescheduled to 6/1 @ 2:30pm per Tretha Sciara  . MALONEY DILATION N/A 08/07/2016   Procedure: Venia Minks DILATION;  Surgeon: Daneil Dolin, MD;  Location: [facility];  Service: Endoscopy;  Laterality: N/A;  . MASTECTOMY, PARTIAL    . RETINAL DETACHMENT SURGERY Right    Family History  Problem Relation Age of Onset  . Multiple myeloma Sister   . Brain cancer Sister   . Dementia Mother        died at 24  . Stroke Mother   . Heart failure Mother   . Diabetes Mother   . Heart disease Father   . Prostate cancer Brother   . Colon cancer Neg Hx    Social History  Substance Use Topics  . Smoking status: Never Smoker  . Smokeless tobacco: Never Used  . Alcohol use No    ROS:  General: Negative for anorexia, weight loss, fever, chills, fatigue, weakness. ENT: Negative for hoarseness, difficulty swallowing , nasal congestion. CV: Negative for chest pain, angina, palpitations, dyspnea on exertion, peripheral edema.  Respiratory: Negative for dyspnea at rest, dyspnea on exertion, cough, sputum, wheezing.  GI: See history of present illness. GU:  Negative for dysuria, hematuria, urinary incontinence, urinary frequency, nocturnal urination.  Endo: Negative for unusual weight change.    Physical Examination:   BP (!) 145/83   Pulse 77   Temp (!) 97.4 F (36.3 C) (Oral)   Ht '5\' 3"'  (1.6 m)   Wt 176 lb (79.8 kg)   BMI 31.18 kg/m   General: Well-nourished, well-developed in no acute distress.  Eyes: No icterus. Mouth: Oropharyngeal mucosa moist and pink , no lesions erythema or exudate. Lungs: Clear to auscultation bilaterally.  Heart: Regular rate and rhythm, no murmurs rubs or gallops.  Abdomen: Bowel sounds are normal, nontender, nondistended, no hepatosplenomegaly or masses, no abdominal bruits or  hernia , no rebound or guarding.   Extremities: No lower extremity edema. No clubbing or deformities. Neuro: Alert and oriented x 4   Skin: Warm and dry, no jaundice.   Psych: Alert and cooperative, normal mood and affect.

## 2016-11-27 NOTE — Op Note (Signed)
[facility] Patient Name: Carly Wood Procedure Date: 11/27/2016 8:17 AM MRN: [id number] Date of Birth: 1935/08/11 Attending MD: Norvel Richards , MD CSN: [id number] Age: 81 Admit Type: Outpatient Procedure:                Upper GI endoscopy Indications:              Surveillance procedure Providers:                Norvel Richards, MD, Otis Peak B. Gwenlyn Perking RN, RN,                            Randa Spike, Technician Referring MD:              Medicines:                Midazolam 4 mg IV, Meperidine 100 mg IV,                            Ondansetron 4 mg IV Complications:            No immediate complications. Estimated Blood Loss:     Estimated blood loss: none. Procedure:                Pre-Anesthesia Assessment:                           - Prior to the procedure, a History and Physical                            was performed, and patient medications and                            allergies were reviewed. The patient's tolerance of                            previous anesthesia was also reviewed. The risks                            and benefits of the procedure and the sedation                            options and risks were discussed with the patient.                            All questions were answered, and informed consent                            was obtained. Prior Anticoagulants: The patient has                            taken no previous anticoagulant or antiplatelet                            agents. ASA Grade Assessment: III - A patient with  severe systemic disease. After reviewing the risks                            and benefits, the patient was deemed in                            satisfactory condition to undergo the procedure.                           After obtaining informed consent, the endoscope was                            passed under direct vision. Throughout the                            procedure, the patient's  blood pressure, pulse, and                            oxygen saturations were monitored continuously. The                            [id number] ([id number]) scope was introduced through the                            mouth, and advanced to the second part of duodenum.                            The upper GI endoscopy was accomplished without                            difficulty. The patient tolerated the procedure                            well. Scope In: 8:28:32 AM Scope Out: 8:31:34 AM Total Procedure Duration: 0 hours 3 minutes 2 seconds  Findings:      The examined esophagus was normal.      Mucosal changes were found in the stomach. Some nodularity of the antrum       and of overlying erythema. Previously noted gastric ulcers completely       healed.      The duodenal bulb and second portion of the duodenum were normal. Impression:               - Normal esophagus.                           - Previously noted gastric ulcers completely                            healed..                           - Normal duodenal bulb and second portion of the                            duodenum.                           -  No specimens collected. Moderate Sedation:      Moderate (conscious) sedation was personally administered by the       endoscopist. The following parameters were monitored: oxygen saturation,       heart rate, blood pressure, respiratory rate, EKG, adequacy of pulmonary       ventilation, and response to care. Total physician intraservice time was       25 minutes. Recommendation:           - Patient has a contact number available for                            emergencies. The signs and symptoms of potential                            delayed complications were discussed with the                            patient. Return to normal activities tomorrow.                            Written discharge instructions were provided to the                            patient.                            - Resume previous diet.                           - Continue present medications. Avoid nonsteroidal                            agents.                           - No repeat upper endoscopy.                           - Return to GI office PRN. Procedure Code(s):        --- Professional ---                           [id number], Esophagogastroduodenoscopy, flexible,                            transoral; diagnostic, including collection of                            specimen(s) by brushing or washing, when performed                            (separate procedure)                           99152, Moderate sedation services provided by the  same physician or other qualified health care                            professional performing the diagnostic or                            therapeutic service that the sedation supports,                            requiring the presence of an independent trained                            observer to assist in the monitoring of the                            patient's level of consciousness and physiological                            status; initial 15 minutes of intraservice time,                            patient age 24 years or older                           [id number], Moderate sedation services; each additional                            15 minutes intraservice time Diagnosis Code(s):        --- Professional ---                           K31.89, Other diseases of stomach and duodenum CPT copyright 2016 American Medical Association. All rights reserved. The codes documented in this report are preliminary and upon coder review may  be revised to meet current compliance requirements. Cristopher Estimable. Avrohom Mckelvin, MD Norvel Richards, MD 11/27/2016 8:54:37 AM This report has been signed electronically. Number of Addenda: 0

## 2016-11-30 ENCOUNTER — Encounter (HOSPITAL_COMMUNITY): Payer: Self-pay | Admitting: Internal Medicine

## 2016-12-22 ENCOUNTER — Other Ambulatory Visit (HOSPITAL_COMMUNITY): Payer: Self-pay | Admitting: Emergency Medicine

## 2016-12-22 DIAGNOSIS — C50911 Malignant neoplasm of unspecified site of right female breast: Secondary | ICD-10-CM

## 2016-12-22 DIAGNOSIS — C50919 Malignant neoplasm of unspecified site of unspecified female breast: Secondary | ICD-10-CM

## 2016-12-22 MED ORDER — NERATINIB MALEATE 40 MG PO TABS: ORAL_TABLET | ORAL | 0 refills | Status: DC

## 2017-01-20 ENCOUNTER — Other Ambulatory Visit (HOSPITAL_COMMUNITY): Payer: Self-pay | Admitting: Emergency Medicine

## 2017-01-20 DIAGNOSIS — C50919 Malignant neoplasm of unspecified site of unspecified female breast: Secondary | ICD-10-CM

## 2017-01-20 DIAGNOSIS — C50911 Malignant neoplasm of unspecified site of right female breast: Secondary | ICD-10-CM

## 2017-01-20 MED ORDER — NERATINIB MALEATE 40 MG PO TABS: ORAL_TABLET | ORAL | 0 refills | Status: DC

## 2017-01-26 ENCOUNTER — Encounter (HOSPITAL_COMMUNITY): Payer: Medicare Other | Attending: Oncology | Admitting: Oncology

## 2017-01-26 ENCOUNTER — Encounter (HOSPITAL_COMMUNITY): Payer: Self-pay | Admitting: Oncology

## 2017-01-26 ENCOUNTER — Encounter (HOSPITAL_BASED_OUTPATIENT_CLINIC_OR_DEPARTMENT_OTHER): Payer: Medicare Other

## 2017-01-26 VITALS — BP 114/64 | HR 67 | Temp 98.0°F | Resp 18 | Wt 178.6 lb

## 2017-01-26 DIAGNOSIS — Z79899 Other long term (current) drug therapy: Secondary | ICD-10-CM | POA: Insufficient documentation

## 2017-01-26 DIAGNOSIS — M8580 Other specified disorders of bone density and structure, unspecified site: Secondary | ICD-10-CM

## 2017-01-26 DIAGNOSIS — C50411 Malignant neoplasm of upper-outer quadrant of right female breast: Secondary | ICD-10-CM | POA: Diagnosis present

## 2017-01-26 DIAGNOSIS — F329 Major depressive disorder, single episode, unspecified: Secondary | ICD-10-CM | POA: Insufficient documentation

## 2017-01-26 DIAGNOSIS — N183 Chronic kidney disease, stage 3 (moderate): Secondary | ICD-10-CM

## 2017-01-26 DIAGNOSIS — Z79811 Long term (current) use of aromatase inhibitors: Secondary | ICD-10-CM | POA: Diagnosis not present

## 2017-01-26 DIAGNOSIS — Z170 Estrogen receptor positive status [ER+]: Secondary | ICD-10-CM

## 2017-01-26 DIAGNOSIS — Z7982 Long term (current) use of aspirin: Secondary | ICD-10-CM | POA: Insufficient documentation

## 2017-01-26 DIAGNOSIS — E1122 Type 2 diabetes mellitus with diabetic chronic kidney disease: Secondary | ICD-10-CM | POA: Diagnosis not present

## 2017-01-26 DIAGNOSIS — Z9221 Personal history of antineoplastic chemotherapy: Secondary | ICD-10-CM | POA: Diagnosis not present

## 2017-01-26 DIAGNOSIS — Z9011 Acquired absence of right breast and nipple: Secondary | ICD-10-CM | POA: Diagnosis not present

## 2017-01-26 DIAGNOSIS — Z880 Allergy status to penicillin: Secondary | ICD-10-CM | POA: Diagnosis not present

## 2017-01-26 DIAGNOSIS — I129 Hypertensive chronic kidney disease with stage 1 through stage 4 chronic kidney disease, or unspecified chronic kidney disease: Secondary | ICD-10-CM | POA: Insufficient documentation

## 2017-01-26 DIAGNOSIS — C50911 Malignant neoplasm of unspecified site of right female breast: Secondary | ICD-10-CM

## 2017-01-26 DIAGNOSIS — I890 Lymphedema, not elsewhere classified: Secondary | ICD-10-CM | POA: Insufficient documentation

## 2017-01-26 DIAGNOSIS — D472 Monoclonal gammopathy: Secondary | ICD-10-CM

## 2017-01-26 DIAGNOSIS — Z95828 Presence of other vascular implants and grafts: Secondary | ICD-10-CM

## 2017-01-26 DIAGNOSIS — Z794 Long term (current) use of insulin: Secondary | ICD-10-CM | POA: Diagnosis not present

## 2017-01-26 DIAGNOSIS — Z452 Encounter for adjustment and management of vascular access device: Secondary | ICD-10-CM | POA: Diagnosis not present

## 2017-01-26 DIAGNOSIS — N184 Chronic kidney disease, stage 4 (severe): Secondary | ICD-10-CM | POA: Diagnosis not present

## 2017-01-26 DIAGNOSIS — D649 Anemia, unspecified: Secondary | ICD-10-CM

## 2017-01-26 DIAGNOSIS — F419 Anxiety disorder, unspecified: Secondary | ICD-10-CM | POA: Insufficient documentation

## 2017-01-26 LAB — CBC WITH DIFFERENTIAL/PLATELET
Basophils Absolute: 0 10*3/uL (ref 0.0–0.1)
Basophils Relative: 1 %
EOS ABS: 0.3 10*3/uL (ref 0.0–0.7)
EOS PCT: 5 %
HCT: 28.8 % — ABNORMAL LOW (ref 36.0–46.0)
Hemoglobin: 9 g/dL — ABNORMAL LOW (ref 12.0–15.0)
LYMPHS ABS: 0.8 10*3/uL (ref 0.7–4.0)
Lymphocytes Relative: 15 %
MCH: 30 pg (ref 26.0–34.0)
MCHC: 31.3 g/dL (ref 30.0–36.0)
MCV: 96 fL (ref 78.0–100.0)
Monocytes Absolute: 0.3 10*3/uL (ref 0.1–1.0)
Monocytes Relative: 6 %
Neutro Abs: 4.3 10*3/uL (ref 1.7–7.7)
Neutrophils Relative %: 75 %
PLATELETS: 273 10*3/uL (ref 150–400)
RBC: 3 MIL/uL — AB (ref 3.87–5.11)
RDW: 16.5 % — ABNORMAL HIGH (ref 11.5–15.5)
WBC: 5.8 10*3/uL (ref 4.0–10.5)

## 2017-01-26 LAB — IRON AND TIBC
Iron: 44 ug/dL (ref 28–170)
Saturation Ratios: 23 % (ref 10.4–31.8)
TIBC: 189 ug/dL — ABNORMAL LOW (ref 250–450)
UIBC: 145 ug/dL

## 2017-01-26 LAB — FERRITIN: FERRITIN: 32 ng/mL (ref 11–307)

## 2017-01-26 LAB — COMPREHENSIVE METABOLIC PANEL
ALK PHOS: 56 U/L (ref 38–126)
ALT: 10 U/L — AB (ref 14–54)
AST: 19 U/L (ref 15–41)
Albumin: 3.2 g/dL — ABNORMAL LOW (ref 3.5–5.0)
Anion gap: 8 (ref 5–15)
BILIRUBIN TOTAL: 0.4 mg/dL (ref 0.3–1.2)
BUN: 30 mg/dL — ABNORMAL HIGH (ref 6–20)
CHLORIDE: 103 mmol/L (ref 101–111)
CO2: 28 mmol/L (ref 22–32)
Calcium: 9.1 mg/dL (ref 8.9–10.3)
Creatinine, Ser: 3.05 mg/dL — ABNORMAL HIGH (ref 0.44–1.00)
GFR calc Af Amer: 16 mL/min — ABNORMAL LOW (ref >60)
GFR, EST NON AFRICAN AMERICAN: 14 mL/min — AB (ref >60)
Glucose, Bld: 109 mg/dL — ABNORMAL HIGH (ref 65–99)
Potassium: 3.7 mmol/L (ref 3.5–5.1)
Sodium: 139 mmol/L (ref 135–145)
Total Protein: 7.2 g/dL (ref 6.5–8.1)

## 2017-01-26 MED ORDER — SODIUM CHLORIDE 0.9% FLUSH
10.0000 mL | INTRAVENOUS | Status: DC | PRN
  Administered 2017-01-26: 10 mL via INTRAVENOUS
  Filled 2017-01-26: qty 10

## 2017-01-26 MED ORDER — HEPARIN SOD (PORK) LOCK FLUSH 100 UNIT/ML IV SOLN
500.0000 [IU] | Freq: Once | INTRAVENOUS | Status: AC
  Administered 2017-01-26: 500 [IU] via INTRAVENOUS

## 2017-01-26 NOTE — Patient Instructions (Signed)
[facility] at [facility]  Discharge Instructions:  You saw dr. zhou today _______________________________________________________________  Thank you for choosing [facility] at [facility] to provide your oncology and hematology care.  To afford each patient quality time with our providers, please arrive at least 15 minutes before your scheduled appointment.  You need to re-schedule your appointment if you arrive 10 or more minutes late.  We strive to give you quality time with our providers, and arriving late affects you and other patients whose appointments are after yours.  Also, if you no show three or more times for appointments you may be dismissed from the clinic.  Again, thank you for choosing [facility] at [facility]. Our hope is that these requests will allow you access to exceptional care and in a timely manner. _______________________________________________________________  If you have questions after your visit, please contact our office at [phone] between the hours of 8:30 a.m. and 5:00 p.m. Voicemails left after 4:30 p.m. will not be returned until the following business day. _______________________________________________________________  For prescription refill requests, have your pharmacy contact our office. _______________________________________________________________  Recommendations made by the consultant and any test results will be sent to your referring physician. _______________________________________________________________ 

## 2017-01-26 NOTE — Progress Notes (Signed)
Carly Wood  Progress Note  CHIEF COMPLAINTS:    Stage 1 infiltrating ductal carcinoma of right female breast ([place])   09/12/2014 Mammogram    Mass in upper outer R breast, middle third depth appears slightly larger than it was on prior exam with more irreg spiculated margins      10/02/2014 Pathology Results    biopsy with invasive ductal carcinoma high grade 1.1 cm, dcis solid type. additional R breast tissue, excision, invasive ductal high grade 0.9 cm       10/24/2014 Pathology Results    no residual invasive carcinoma, DCIS, focal, atypical ductal hyperplasia, 0/7 LN positive for metastatic carcinoma ER > 90%, PR 30%, HER 2 2+      10/24/2014 Cancer Staging    [id number]      10/24/2014 Surgery    R mastectomy, T1c, N0      12/28/2014 - 11/22/2015 Chemotherapy    Taxol/herceptin weekly X 12, Herceptin every 21 days, last due in September 2017      03/11/2015 -  Anti-estrogen oral therapy    Arimidex 1 mg daily      09/12/2015 Imaging    MUGA- The left ventricular ejection fraction equals 68%.      06/08/2016 Treatment Plan Change    Started Nerlynx      HISTORY OF PRESENTING ILLNESS:  Carly Wood 81 y.o. female is here for ongoing care for a stage I ER+ PR+ HER 2 + carcinoma of the R breast with her sister.   Patient states she is tolerating nerlynx well. Her sister states she normally takes the nerlynx after her evening meal, between 5-6 pm and afterwards she gets sleepy. She denies any associated nausea, vomiting, diarrhea. She states her appetite is good and she is sleeping well. She denies any recent weight loss.She has no complaints today.  MEDICAL HISTORY:  Past Medical History:  Diagnosis Date  . Anemia   . Anxiety   . Chronic kidney disease   . Chronic renal disease, stage 4, severely decreased glomerular filtration rate (GFR) between 15-29 mL/min/1.73 square meter (HCC) 08/22/2015  . Depression   . Diabetes mellitus without complication ([place])     . Hypertension   . Stage 1 infiltrating ductal carcinoma of right female breast ([place]) 08/21/2015   ER+ PR+ HER 2 neu + (3+) T1cN0     SURGICAL HISTORY: Past Surgical History:  Procedure Laterality Date  . BIOPSY  08/07/2016   Performed by Daneil Dolin, MD at [facility]  . ESOPHAGOGASTRODUODENOSCOPY (EGD) N/A 11/27/2016   Performed by Daneil Dolin, MD at [facility]  . ESOPHAGOGASTRODUODENOSCOPY (EGD) N/A 08/07/2016   Performed by Daneil Dolin, MD at [facility]  . FRACTURE SURGERY Right    ankle  . MALONEY DILATION N/A 08/07/2016   Performed by Daneil Dolin, MD at [facility]  . MASTECTOMY, PARTIAL    . RETINAL DETACHMENT SURGERY Right     SOCIAL HISTORY: Social History   Socioeconomic History  . Marital status: Single    Spouse name: Not on file  . Number of children: Not on file  . Years of education: Not on file  . Highest education level: Not on file  Social Needs  . Financial resource strain: Not on file  . Food insecurity - worry: Not on file  . Food insecurity - inability: Not on file  . Transportation needs - medical: Not on file  . Transportation needs -  non-medical: Not on file  Occupational History  . Not on file  Tobacco Use  . Smoking status: Never Smoker  . Smokeless tobacco: Never Used  Substance and Sexual Activity  . Alcohol use: No    Alcohol/week: 0.0 oz  . Drug use: No  . Sexual activity: Not on file  Other Topics Concern  . Not on file  Social History Narrative  . Not on file   Carly Wood, the patient, never married; says she never wanted to. She remarks that this was a choice she made; "not that I didn't have the opportunities, I just made the choice not to do it." NO tobacco, cigarettes or chewing tobacco. No ETOH  The 3 sisters live together. Deneise Lever does all the cooking.  She has nieces and nephews; one teaches school in [place]. Pilar Plate is her oldest brother; he was a principal. Has retired. Pilar Plate has a wife who was a  Pharmacist, hospital; they have 3 children, including a son who graduated from [employer], and a daughter who graduated from [employer] who is a Sport and exercise psychologist, and another daughter who has a PhD in education. Harle Battiest is her "favorite niece." Martin Majestic to college and is teaching something to do with drafting.  The patient, Carly Wood, was a good Ship broker; graduated high school and college. 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 Taught in [place], [place].  She notes she had hobbies; used to do artwork. Drawing pictures, mostly flowers, houses. Pen and pencil, colored pencils.  FAMILY HISTORY: Family History  Problem Relation Age of Onset  . Multiple myeloma Sister   . Brain cancer Sister   . Dementia Mother        died at 41  . Stroke Mother   . Heart failure Mother   . Diabetes Mother   . Heart disease Father   . Prostate cancer Brother   . 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  Father was 21 when he died; heart disease. Mother was 16 when she died. Diabetic, with congestive heart failure. Had a stroke as well. Sister Deneise Lever was caregiver for their mother for 10 years after her stroke. Her sister Wilford Sports has multiple myeloma and also had a CNS lymphoma, and notes that she is on Revlamid. Wilford Sports is 16 years out from her brain surgery.  7 brothers and sisters total; one is deceased. 6 siblings left. 1 that passed died of prostate cancer. "He lasted 12 years."  ALLERGIES:  is allergic to penicillins; ace inhibitors; and lisinopril.  MEDICATIONS:  Current Outpatient Medications  Medication Sig Dispense Refill  . acetaminophen (TYLENOL) 500 MG tablet Take 500 mg by mouth every 6 (six)  hours as needed.     Marland Kitchen amLODipine (NORVASC) 5 MG tablet Take 5 mg by mouth daily.     Marland Kitchen anastrozole (ARIMIDEX) 1 MG tablet Take 1 tablet (1 mg total) by mouth daily. 90 tablet 3  . aspirin 81 MG chewable tablet Chew 81 mg by mouth daily.     . calcitRIOL (ROCALTROL) 0.25 MCG capsule Take 0.25 mcg by mouth daily.    . cloNIDine (CATAPRES) 0.3 MG tablet  Take 0.3 mg by mouth 2 (two) times daily.     . Insulin Glargine (TOUJEO MAX SOLOSTAR) 300 UNIT/ML SOPN Inject 5 Units into the skin every evening.    . iron polysaccharides (NIFEREX) 150 MG capsule Take 150 mg by mouth daily.    Marland Kitchen lidocaine-prilocaine (EMLA) cream Apply 1 application topically as needed. Apply to port site prior when needed for chemo, labs or port flushes. 30 g 2  . loperamide (LOPERAMIDE A-D) 2 MG tablet Days 1-14 take 2 tabs three times a day Days 15-56 take 2 tabs two times a day Days 57-365 take 2 tabs as needed (max dose 16 mg/day) 140 tablet 3  . nebivolol (BYSTOLIC) 10 MG tablet Take 10 mg by mouth daily.     . Neratinib Maleate (NERLYNX) 40 MG tablet TAKE 6 TABLETS (240MG) BY MOUTH ONE TIME DAILY WITH FOOD. 180 tablet 0  . ondansetron (ZOFRAN) 8 MG tablet Take 1 tablet (8 mg total) by mouth every 8 (eight) hours as needed for nausea or vomiting. 30 tablet 2  . PARoxetine (PAXIL) 20 MG tablet Take 20 mg by mouth daily.     . pioglitazone (ACTOS) 30 MG tablet Take 15 mg by mouth daily.     . pravastatin (PRAVACHOL) 20 MG tablet Take 20 mg by mouth daily. Reported on 08/30/2015    . prochlorperazine (COMPAZINE) 10 MG tablet Take 1 tablet (10 mg total) by mouth every 6 (six) hours as needed for nausea or vomiting. 30 tablet 2  . traZODone (DESYREL) 50 MG tablet Take 100 mg by mouth at bedtime.      No current facility-administered medications for this visit.    Review of Systems  Constitutional: Negative for weight loss.  Eyes: Negative.   Respiratory: Negative.   Cardiovascular: Negative.   Gastrointestinal: Negative for  diarrhea, nausea and vomiting.  Genitourinary: Negative.   Musculoskeletal: Negative.   Skin: Negative.   Neurological: Negative.   Endo/Heme/Allergies: Negative.   Psychiatric/Behavioral: The patient is not nervous/anxious.   All other systems reviewed and are negative. 14 point ROS was done and is otherwise as detailed above or in HPI  PHYSICAL EXAMINATION: ECOG PERFORMANCE STATUS: 1 - Symptomatic but completely ambulatory  Vitals:   01/26/17 1100  BP: 114/64  Pulse: 67  Resp: 18  Temp: 98 F (36.7 C)  SpO2: 100%   Filed Weights   01/26/17 1100  Weight: 178 lb 9.6 oz (81 kg)   Physical Exam  Constitutional: She is oriented to person, place, and time and well-developed, well-nourished, and in no distress. No distress.  HENT:  Head: Normocephalic and atraumatic.  Nose: Nose normal.  Mouth/Throat: Oropharynx is clear and moist. No oropharyngeal exudate.  Eyes: Conjunctivae and EOM are normal. Pupils are equal, round, and reactive to light. Right eye exhibits no discharge. Left eye exhibits no discharge. No scleral icterus.  Neck: Normal range of motion. Neck supple. No JVD present. No tracheal deviation present. No thyromegaly present.  Cardiovascular: Normal rate, regular rhythm and normal heart sounds. Exam reveals no gallop and no friction rub.  No murmur heard. Pulmonary/Chest: Effort normal and breath sounds normal. No respiratory distress. She has no wheezes. She has no rales.  Abdominal: Soft. Bowel sounds are normal. She exhibits no distension and no mass. There is no tenderness. There is no rebound and no guarding.  Musculoskeletal: Normal range of motion. She exhibits no edema or tenderness.  Lymphadenopathy:    She has no cervical adenopathy.  Neurological: She  is alert and oriented to person, place, and time. No cranial nerve deficit.  Skin: Skin is warm and dry. No rash noted. No erythema. No pallor.  Psychiatric: Mood, memory, affect and judgment normal.    Nursing note and vitals reviewed.  LABORATORY DATA:  I have reviewed the data as listed Lab Results  Component Value Date   WBC 5.9 10/27/2016   HGB 9.1 (L) 10/27/2016   HCT 28.1 (L) 10/27/2016   MCV 95.3 10/27/2016   PLT 260 10/27/2016   CMP     Component Value Date/Time   NA 141 10/27/2016 1052   K 3.9 10/27/2016 1052   CL 106 10/27/2016 1052   CO2 26 10/27/2016 1052   GLUCOSE 130 (H) 10/27/2016 1052   BUN 32 (H) 10/27/2016 1052   CREATININE 2.81 (H) 10/27/2016 1052   CALCIUM 8.8 (L) 10/27/2016 1052   PROT 7.3 10/27/2016 1052   ALBUMIN 3.2 (L) 10/27/2016 1052   AST 19 10/27/2016 1052   ALT 10 (L) 10/27/2016 1052   ALKPHOS 67 10/27/2016 1052   BILITOT 0.3 10/27/2016 1052   GFRNONAA 15 (L) 10/27/2016 1052   GFRAA 17 (L) 10/27/2016 1052   RADIOGRAPHIC STUDIES: I have personally reviewed the radiological images as listed and agreed with the findings in the report.  DG Bone Survey Met 05/13/2016 IMPRESSION: 1. Asymmetric and abnormal bone mineralization in the right hemipelvis about the acetabulum and inferior pubic ramus, but favor benign etiology such as Paget's disease. Nuclear Medicine Whole-body Bone Scan or noncontrast CT Right Hip correlation may be confirmatory. 2. Normal bone mineralization elsewhere throughout the visible skeleton. Mild L1 compression fracture is new since 2009 but appears chronic and benign. 3. Calcified aortic atherosclerosis. Fibroid uterus. Cholelithiasis.  ASSESSMENT & PLAN:  1. Stage I ER+ PR+ HER 2 + carcinoma of R breast, upper outer quadrant R mastectomy Arimidex Herceptin Anemia CKD, Stage III DEXA with osteopenia 05/2015 Last 2-D echo 05/2015 Lymphedema  L breast screening mammogram due 08/2016   -She is to continue arimidex, calcium and vitamin D. Continue nerylynx, which she is tolerating well.    2. IgA gammopathy with M-spike:  -IgA elevated at 1216; M-spike detectable at 0.7.   Regarding her IgA gammopathy, patient  has refused bone marrow biopsy for further workup. She states she discussed it with her nephrologist who told her he will monitor her labs instead.  I have ordered follow up labs to monitor her IgA gammopathy on her next visit. Her labs from today are pending, they were drawn during her port flush.  Dispo: She will return for follow up in 12  weeks with labs and also to get her port flush.   Orders Placed This Encounter  Procedures  . CBC with Differential    Standing Status:   Future    Standing Expiration Date:   01/26/2018  . Comprehensive metabolic panel    Standing Status:   Future    Standing Expiration Date:   01/26/2018  . Immunofixation electrophoresis    Standing Status:   Future    Standing Expiration Date:   01/26/2018  . Protein electrophoresis, serum    Standing Status:   Future    Standing Expiration Date:   01/26/2018    All questions were answered. The patient knows to call the clinic with any problems, questions or concerns.   This note was electronically signed.  Twana First, MD  01/26/2017 11:07 AM

## 2017-01-26 NOTE — Progress Notes (Signed)
Carly Wood presented for Portacath access and flush. Portacath located left chest wall accessed with  H 20 needle.  Good blood return present. Portacath flushed with 76ml NS and 500U/33ml Heparin and needle removed intact.  Procedure tolerated well and without incident.  Discharged ambulatory.

## 2017-01-27 LAB — KAPPA/LAMBDA LIGHT CHAINS
KAPPA FREE LGHT CHN: 90.3 mg/L — AB (ref 3.3–19.4)
KAPPA, LAMDA LIGHT CHAIN RATIO: 0.2 — AB (ref 0.26–1.65)
Lambda free light chains: 451 mg/L — ABNORMAL HIGH (ref 5.7–26.3)

## 2017-01-27 LAB — BETA 2 MICROGLOBULIN, SERUM: BETA 2 MICROGLOBULIN: 8.1 mg/L — AB (ref 0.6–2.4)

## 2017-01-28 LAB — MULTIPLE MYELOMA PANEL, SERUM
ALBUMIN SERPL ELPH-MCNC: 3 g/dL (ref 2.9–4.4)
ALBUMIN/GLOB SERPL: 0.8 (ref 0.7–1.7)
ALPHA 1: 0.2 g/dL (ref 0.0–0.4)
ALPHA2 GLOB SERPL ELPH-MCNC: 0.7 g/dL (ref 0.4–1.0)
B-Globulin SerPl Elph-Mcnc: 1.8 g/dL — ABNORMAL HIGH (ref 0.7–1.3)
GAMMA GLOB SERPL ELPH-MCNC: 1 g/dL (ref 0.4–1.8)
Globulin, Total: 3.8 g/dL (ref 2.2–3.9)
IGA: 1307 mg/dL — AB (ref 64–422)
IGG (IMMUNOGLOBIN G), SERUM: 1199 mg/dL (ref 700–1600)
IGM (IMMUNOGLOBULIN M), SRM: 16 mg/dL — AB (ref 26–217)
M Protein SerPl Elph-Mcnc: 0.7 g/dL — ABNORMAL HIGH
TOTAL PROTEIN ELP: 6.8 g/dL (ref 6.0–8.5)

## 2017-02-23 ENCOUNTER — Other Ambulatory Visit (HOSPITAL_COMMUNITY): Payer: Self-pay | Admitting: *Deleted

## 2017-02-23 ENCOUNTER — Telehealth (HOSPITAL_COMMUNITY): Payer: Self-pay | Admitting: Oncology

## 2017-02-23 DIAGNOSIS — C50919 Malignant neoplasm of unspecified site of unspecified female breast: Secondary | ICD-10-CM

## 2017-02-23 DIAGNOSIS — C50911 Malignant neoplasm of unspecified site of right female breast: Secondary | ICD-10-CM

## 2017-02-23 MED ORDER — NERATINIB MALEATE 40 MG PO TABS: ORAL_TABLET | ORAL | 0 refills | Status: DC

## 2017-02-23 NOTE — Telephone Encounter (Signed)
FAXED NERLYNX RX

## 2017-03-23 ENCOUNTER — Other Ambulatory Visit (HOSPITAL_COMMUNITY): Payer: Self-pay | Admitting: Emergency Medicine

## 2017-03-23 DIAGNOSIS — C50911 Malignant neoplasm of unspecified site of right female breast: Secondary | ICD-10-CM

## 2017-03-23 DIAGNOSIS — C50919 Malignant neoplasm of unspecified site of unspecified female breast: Secondary | ICD-10-CM

## 2017-03-23 MED ORDER — NERATINIB MALEATE 40 MG PO TABS: ORAL_TABLET | ORAL | 0 refills | Status: DC

## 2017-03-23 NOTE — Progress Notes (Signed)
nerlynx refilled

## 2017-04-21 ENCOUNTER — Other Ambulatory Visit (HOSPITAL_COMMUNITY): Payer: Self-pay

## 2017-04-21 MED ORDER — ANASTROZOLE 1 MG PO TABS: 1.0000 mg | ORAL_TABLET | Freq: Every day | ORAL | 3 refills | Status: DC

## 2017-04-21 NOTE — Telephone Encounter (Signed)
Received refill request from patients pharmacy for arimidex. Reviewed with provider, chart checked and refilled.

## 2017-04-26 ENCOUNTER — Encounter (HOSPITAL_COMMUNITY): Payer: Self-pay | Admitting: Oncology

## 2017-04-26 ENCOUNTER — Inpatient Hospital Stay (HOSPITAL_COMMUNITY): Payer: Medicare Other

## 2017-04-26 ENCOUNTER — Inpatient Hospital Stay (HOSPITAL_COMMUNITY): Payer: Medicare Other | Attending: Oncology | Admitting: Oncology

## 2017-04-26 VITALS — BP 160/79 | HR 71 | Temp 98.2°F | Resp 18 | Wt 184.0 lb

## 2017-04-26 DIAGNOSIS — N184 Chronic kidney disease, stage 4 (severe): Secondary | ICD-10-CM

## 2017-04-26 DIAGNOSIS — D472 Monoclonal gammopathy: Secondary | ICD-10-CM | POA: Insufficient documentation

## 2017-04-26 DIAGNOSIS — Z79811 Long term (current) use of aromatase inhibitors: Secondary | ICD-10-CM

## 2017-04-26 DIAGNOSIS — C50411 Malignant neoplasm of upper-outer quadrant of right female breast: Secondary | ICD-10-CM | POA: Diagnosis not present

## 2017-04-26 DIAGNOSIS — I129 Hypertensive chronic kidney disease with stage 1 through stage 4 chronic kidney disease, or unspecified chronic kidney disease: Secondary | ICD-10-CM

## 2017-04-26 DIAGNOSIS — D649 Anemia, unspecified: Secondary | ICD-10-CM | POA: Diagnosis not present

## 2017-04-26 DIAGNOSIS — Z452 Encounter for adjustment and management of vascular access device: Secondary | ICD-10-CM | POA: Insufficient documentation

## 2017-04-26 DIAGNOSIS — C50911 Malignant neoplasm of unspecified site of right female breast: Secondary | ICD-10-CM

## 2017-04-26 DIAGNOSIS — M8580 Other specified disorders of bone density and structure, unspecified site: Secondary | ICD-10-CM | POA: Diagnosis not present

## 2017-04-26 DIAGNOSIS — D509 Iron deficiency anemia, unspecified: Secondary | ICD-10-CM

## 2017-04-26 DIAGNOSIS — Z170 Estrogen receptor positive status [ER+]: Secondary | ICD-10-CM | POA: Diagnosis not present

## 2017-04-26 LAB — COMPREHENSIVE METABOLIC PANEL
ALBUMIN: 3.2 g/dL — AB (ref 3.5–5.0)
ALT: 12 U/L — ABNORMAL LOW (ref 14–54)
ANION GAP: 10 (ref 5–15)
AST: 19 U/L (ref 15–41)
Alkaline Phosphatase: 60 U/L (ref 38–126)
BILIRUBIN TOTAL: 0.3 mg/dL (ref 0.3–1.2)
BUN: 31 mg/dL — ABNORMAL HIGH (ref 6–20)
CO2: 24 mmol/L (ref 22–32)
Calcium: 8.9 mg/dL (ref 8.9–10.3)
Chloride: 106 mmol/L (ref 101–111)
Creatinine, Ser: 2.89 mg/dL — ABNORMAL HIGH (ref 0.44–1.00)
GFR calc non Af Amer: 14 mL/min — ABNORMAL LOW (ref >60)
GFR, EST AFRICAN AMERICAN: 17 mL/min — AB (ref >60)
GLUCOSE: 107 mg/dL — AB (ref 65–99)
POTASSIUM: 4.3 mmol/L (ref 3.5–5.1)
SODIUM: 140 mmol/L (ref 135–145)
TOTAL PROTEIN: 7.5 g/dL (ref 6.5–8.1)

## 2017-04-26 LAB — CBC WITH DIFFERENTIAL/PLATELET
Basophils Absolute: 0 10*3/uL (ref 0.0–0.1)
Basophils Relative: 1 %
EOS ABS: 0.2 10*3/uL (ref 0.0–0.7)
EOS PCT: 4 %
HCT: 28.9 % — ABNORMAL LOW (ref 36.0–46.0)
Hemoglobin: 9 g/dL — ABNORMAL LOW (ref 12.0–15.0)
LYMPHS ABS: 0.9 10*3/uL (ref 0.7–4.0)
LYMPHS PCT: 16 %
MCH: 29.9 pg (ref 26.0–34.0)
MCHC: 31.1 g/dL (ref 30.0–36.0)
MCV: 96 fL (ref 78.0–100.0)
MONO ABS: 0.4 10*3/uL (ref 0.1–1.0)
Monocytes Relative: 6 %
Neutro Abs: 4.2 10*3/uL (ref 1.7–7.7)
Neutrophils Relative %: 73 %
PLATELETS: 267 10*3/uL (ref 150–400)
RBC: 3.01 MIL/uL — ABNORMAL LOW (ref 3.87–5.11)
RDW: 16.6 % — AB (ref 11.5–15.5)
WBC: 5.7 10*3/uL (ref 4.0–10.5)

## 2017-04-26 LAB — IRON AND TIBC
IRON: 42 ug/dL (ref 28–170)
SATURATION RATIOS: 22 % (ref 10.4–31.8)
TIBC: 193 ug/dL — AB (ref 250–450)
UIBC: 151 ug/dL

## 2017-04-26 LAB — FERRITIN: Ferritin: 28 ng/mL (ref 11–307)

## 2017-04-26 MED ORDER — SODIUM CHLORIDE 0.9% FLUSH
10.0000 mL | INTRAVENOUS | Status: DC | PRN
  Administered 2017-04-26: 10 mL
  Filled 2017-04-26: qty 10

## 2017-04-26 MED ORDER — HEPARIN SOD (PORK) LOCK FLUSH 100 UNIT/ML IV SOLN
500.0000 [IU] | Freq: Once | INTRAVENOUS | Status: AC
  Administered 2017-04-26: 500 [IU] via INTRAVENOUS

## 2017-04-26 NOTE — Progress Notes (Signed)
[place]  Progress Note  CHIEF COMPLAINTS:    Stage 1 infiltrating ductal carcinoma of right female breast ([place])   09/12/2014 Mammogram    Mass in upper outer R breast, middle third depth appears slightly larger than it was on prior exam with more irreg spiculated margins      10/02/2014 Pathology Results    biopsy with invasive ductal carcinoma high grade 1.1 cm, dcis solid type. additional R breast tissue, excision, invasive ductal high grade 0.9 cm       10/24/2014 Pathology Results    no residual invasive carcinoma, DCIS, focal, atypical ductal hyperplasia, 0/7 LN positive for metastatic carcinoma ER > 90%, PR 30%, HER 2 2+      10/24/2014 Cancer Staging    [id number]      10/24/2014 Surgery    R mastectomy, T1c, N0      12/28/2014 - 11/22/2015 Chemotherapy    Taxol/herceptin weekly X 12, Herceptin every 21 days, last due in September 2017      03/11/2015 -  Anti-estrogen oral therapy    Arimidex 1 mg daily      09/12/2015 Imaging    MUGA- The left ventricular ejection fraction equals 68%.      06/08/2016 Treatment Plan Change    Started Nerlynx      HISTORY OF PRESENTING ILLNESS:  Patient is here for follow-up of the right breast cancer accompanied by her sister.  Patient states she is doing well. Appetite 100% and energy level 50%. She is in no pain. She occasionally has chills but states "she stays cold". She complains of chronic fatigue. She also has intermittent leg swelling that is chronic. Patient sisters states she is very inactive and only gets up to use the bathroom. She does not prepare any of her own meals and eats whenever it is fixed for her.   She continues to tolerate nerlynx and Arimidex well. She is taking calcium and vitamin D as prescribed. She denies nausea, vomiting, diarrhea or recent weight loss.   MEDICAL HISTORY:  Past Medical History:  Diagnosis Date  . Anemia   . Anxiety   . Chronic kidney disease   . Chronic renal disease,  stage 4, severely decreased glomerular filtration rate (GFR) between 15-29 mL/min/1.73 square meter (HCC) 08/22/2015  . Depression   . Diabetes mellitus without complication ([place])   . Hypertension   . Stage 1 infiltrating ductal carcinoma of right female breast ([place]) 08/21/2015   ER+ PR+ HER 2 neu + (3+) T1cN0     SURGICAL HISTORY: Past Surgical History:  Procedure Laterality Date  . BIOPSY  08/07/2016   Procedure: BIOPSY;  Surgeon: Daneil Dolin, MD;  [facility];  Service: Endoscopy;;  gastric ulcer biopsy  . ESOPHAGOGASTRODUODENOSCOPY N/A 08/07/2016   Procedure: ESOPHAGOGASTRODUODENOSCOPY (EGD);  Surgeon: Daneil Dolin, MD;  [facility];  Service: Endoscopy;  Laterality: N/A;  1215-rescheduled to 6/1 @ 2:30pm per Tretha Sciara  . ESOPHAGOGASTRODUODENOSCOPY N/A 11/27/2016   Procedure: ESOPHAGOGASTRODUODENOSCOPY (EGD);  Surgeon: Daneil Dolin, MD;  [facility];  Service: Endoscopy;  Laterality: N/A;  8:15am  . FRACTURE SURGERY Right    ankle  . MALONEY DILATION N/A 08/07/2016   Procedure: Venia Minks DILATION;  Surgeon: Daneil Dolin, MD;  [facility];  Service: Endoscopy;  Laterality: N/A;  . MASTECTOMY, PARTIAL    . RETINAL DETACHMENT SURGERY Right     SOCIAL HISTORY: Social History   Socioeconomic History  .  Marital status: Single    Spouse name: Not on file  . Number of children: Not on file  . Years of education: Not on file  . Highest education level: Not on file  Social Needs  . Financial resource strain: Not on file  . Food insecurity - worry: Not on file  . Food insecurity - inability: Not on file  . Transportation needs - medical: Not on file  . Transportation needs - non-medical: Not on file  Occupational History  . Not on file  Tobacco Use  . Smoking status: Never Smoker  . Smokeless tobacco: Never Used  Substance and Sexual Activity  . Alcohol use: No    Alcohol/week: 0.0 oz  . Drug use: No  . Sexual activity: Not on  file  Other Topics Concern  . Not on file  Social History Narrative  . Not on file   [name], the patient, never married; says she never wanted to. She remarks that this was a choice she made; "not that I didn't have the opportunities, I just made the choice not to do it." NO tobacco, cigarettes or chewing tobacco. No ETOH  The 3 sisters live together. Deneise Lever does all the cooking.  She has nieces and nephews; one teaches school in [place]. Pilar Plate is her oldest brother; he was a principal. Has retired. Pilar Plate has a wife who was a Pharmacist, hospital; they have 3 children, including a son who graduated from [employer], and a daughter who graduated from [employer] who is a Sport and exercise psychologist, and another daughter who has a PhD in education. Harle Battiest is her "favorite niece." Martin Majestic to college and is teaching something to do with drafting.  The patient, [name], was a good Ship broker; graduated high school and college. 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 Taught in [place], [place].  She notes she had hobbies; used to do artwork. Drawing pictures, mostly flowers, houses. Pen and pencil, colored pencils.  FAMILY HISTORY: Family History  Problem Relation Age of Onset  . Multiple myeloma Sister   . Brain cancer Sister   . Dementia Mother        died at 79  . Stroke Mother   . Heart failure Mother   . Diabetes Mother   . Heart disease Father   . Prostate cancer Brother   . 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  Father was 38 when he died; heart disease. Mother was 67 when she died. Diabetic, with congestive heart failure. Had a stroke as well. Sister Deneise Lever was caregiver for their mother  for 10 years after her stroke. Her sister Wilford Sports has multiple myeloma and also had a CNS lymphoma, and notes that she is on Revlamid. Wilford Sports is 16 years out from her brain surgery.  7 brothers and sisters total; one is deceased. 6 siblings left. 1 that passed died of prostate cancer. "He lasted 12 years."  ALLERGIES:  is allergic to penicillins; ace inhibitors; and lisinopril.  MEDICATIONS:  Current Outpatient Medications  Medication Sig Dispense Refill  . aspirin EC 81 MG tablet Take 81 mg by mouth daily.    Marland Kitchen  acetaminophen (TYLENOL) 500 MG tablet Take 500 mg by mouth every 6 (six) hours as needed.     Marland Kitchen amLODipine (NORVASC) 5 MG tablet Take 5 mg by mouth daily.     Marland Kitchen anastrozole (ARIMIDEX) 1 MG tablet Take 1 tablet (1 mg total) by mouth daily. 90 tablet 3  . calcitRIOL (ROCALTROL) 0.25 MCG capsule Take 0.25 mcg by mouth daily.    . cloNIDine (CATAPRES) 0.3 MG tablet Take 0.3 mg by mouth 2 (two) times daily.     . Insulin Glargine (TOUJEO MAX SOLOSTAR) 300 UNIT/ML SOPN Inject 5 Units into the skin every evening.    . iron polysaccharides (NIFEREX) 150 MG capsule Take 150 mg by mouth daily.    Marland Kitchen lidocaine-prilocaine (EMLA) cream Apply 1 application topically as needed. Apply to port site prior when needed for chemo, labs or port flushes. 30 g 2  . loperamide (LOPERAMIDE A-D) 2 MG tablet Days 1-14 take 2 tabs three times a day Days 15-56 take 2 tabs two times a day Days 57-365 take 2 tabs as needed (max dose 16 mg/day) 140 tablet 3  . nebivolol (BYSTOLIC) 10 MG tablet Take 10 mg by mouth daily.     . Neratinib Maleate (NERLYNX) 40 MG tablet TAKE 6 TABLETS (240MG) BY MOUTH ONE TIME DAILY WITH FOOD. 180 tablet 0  . ondansetron (ZOFRAN) 8 MG tablet Take 1 tablet (8 mg total) by mouth every 8 (eight) hours as needed for nausea or vomiting. 30 tablet 2  . PARoxetine (PAXIL) 20 MG tablet Take 20 mg by mouth daily.     . pioglitazone (ACTOS) 30 MG tablet Take 15 mg by mouth daily.     . pravastatin  (PRAVACHOL) 20 MG tablet Take 20 mg by mouth daily. Reported on 08/30/2015    . prochlorperazine (COMPAZINE) 10 MG tablet Take 1 tablet (10 mg total) by mouth every 6 (six) hours as needed for nausea or vomiting. 30 tablet 2  . traZODone (DESYREL) 50 MG tablet Take 100 mg by mouth at bedtime.      No current facility-administered medications for this visit.    Review of Systems  Constitutional: Negative for chills, fever, malaise/fatigue and weight loss.  HENT: Negative for congestion and ear pain.   Eyes: Negative.  Negative for blurred vision and double vision.  Respiratory: Negative.  Negative for cough, sputum production and shortness of breath.   Cardiovascular: Positive for leg swelling (intermittent relieved with elevation and rest). Negative for chest pain and palpitations.  Gastrointestinal: Negative.  Negative for abdominal pain, constipation, diarrhea, nausea and vomiting.  Genitourinary: Negative for dysuria, frequency and urgency.  Musculoskeletal: Negative for back pain and falls.  Skin: Negative.  Negative for rash.  Neurological: Positive for weakness (deconditioned). Negative for headaches.  Endo/Heme/Allergies: Negative.  Does not bruise/bleed easily.  Psychiatric/Behavioral: Negative.  Negative for depression. The patient is not nervous/anxious and does not have insomnia.   14 point ROS was done and is otherwise as detailed above or in HPI  PHYSICAL EXAMINATION: ECOG PERFORMANCE STATUS: 1 - Symptomatic but completely ambulatory  Vitals:   04/26/17 1146  BP: (!) 160/79  Pulse: 71  Resp: 18  Temp: 98.2 F (36.8 C)  SpO2: 98%   Filed Weights   04/26/17 1146  Weight: 184 lb (83.5 kg)   Physical Exam  Constitutional: She is oriented to person, place, and time and well-developed, well-nourished, and in no distress. Vital signs are normal.  HENT:  Head: Normocephalic and atraumatic.  Eyes: Pupils are equal, round, and reactive to light.  Neck: Normal range of  motion.  Cardiovascular: Normal rate, regular rhythm and normal heart sounds.  No murmur heard. Pulmonary/Chest: Effort normal and breath sounds normal. She has no wheezes.  Abdominal: Soft. Normal appearance and bowel sounds are normal. She exhibits no distension. There is no tenderness.  Musculoskeletal: Normal range of motion. She exhibits no edema.  Neurological: She is alert and oriented to person, place, and time. Gait normal.  Skin: Skin is warm and dry. No rash noted.  Psychiatric: Mood, memory, affect and judgment normal.   LABORATORY DATA:  I have reviewed the data as listed Lab Results  Component Value Date   WBC 5.7 04/26/2017   HGB 9.0 (L) 04/26/2017   HCT 28.9 (L) 04/26/2017   MCV 96.0 04/26/2017   PLT 267 04/26/2017   CMP     Component Value Date/Time   NA 140 04/26/2017 1600   K 4.3 04/26/2017 1600   CL 106 04/26/2017 1600   CO2 24 04/26/2017 1600   GLUCOSE 107 (H) 04/26/2017 1600   BUN 31 (H) 04/26/2017 1600   CREATININE 2.89 (H) 04/26/2017 1600   CALCIUM 8.9 04/26/2017 1600   PROT 7.5 04/26/2017 1600   ALBUMIN 3.2 (L) 04/26/2017 1600   AST 19 04/26/2017 1600   ALT 12 (L) 04/26/2017 1600   ALKPHOS 60 04/26/2017 1600   BILITOT 0.3 04/26/2017 1600   GFRNONAA 14 (L) 04/26/2017 1600   GFRAA 17 (L) 04/26/2017 1600   RADIOGRAPHIC STUDIES: I have personally reviewed the radiological images as listed and agreed with the findings in the report.  DG Bone Survey Met 05/13/2016 IMPRESSION: 1. Asymmetric and abnormal bone mineralization in the right hemipelvis about the acetabulum and inferior pubic ramus, but favor benign etiology such as Paget's disease. Nuclear Medicine Whole-body Bone Scan or noncontrast CT Right Hip correlation may be confirmatory. 2. Normal bone mineralization elsewhere throughout the visible skeleton. Mild L1 compression fracture is new since 2009 but appears chronic and benign. 3. Calcified aortic atherosclerosis. Fibroid uterus.  Cholelithiasis.  ASSESSMENT & PLAN:  1. Stage I ER+ PR+ HER 2 + carcinoma of R breast, upper outer quadrant R mastectomy Arimidex Herceptin Anemia CKD, Stage III DEXA with osteopenia 05/2015 Last 2-D echo 05/2015 Lymphedema  L breast screening mammogram due 08/2016  Patient to continue her Arimidex, calcium and vitamin D. She is to continue Nerylynx which she tolerates well. Mammogram from June 2018 revealed BI-RADS Category 1 negative findings. We will repeat scan in June 2019. Orders can be placed at subsequent visits. Most recent DEXA scan from March 2017 showed a T score of -1.7 indicating osteopenia. Patient is in need of repeat DEXA. Will place orders today.  2. IgA gammopathy with M-spike:  - IgA elevated today at 1227. M spike 0.7 which indicates a slight decrease from 6 months ago. IgG normal and IgM low at 14. Continue to monitor. CRAB Symptoms:  Calcium normal.  Renal function elevated. History of chronic kidney disease. Not currently on ESA therapy. May benefit from this in the future.  She is Anemic with hemoglobin of 9. Will add on iron and ferritin levels. Her anemia is likely due to CKD. Ferritin is 28 today. I do not see where she has received IV iron in the past. Patient's hemoglobin has not been greater than 10.1 since 08/30/2015. We can try giving her dose of IV iron if she is agreeable to see if this helps. Please set patient  up for 1 dose of IV injectafer.  Calculated iron deficit is 895  mg with a target HGB of 14 g/dL. Patient also had a recent upper endoscopy showing a normal esophagus and previously noted gastric ulcers were completely healed. I do not see where patient has never had a colonoscopy. She denies Bone pain. Initial bone survey workup 05/13/16 showed asymmetric and abnormal bone mineralization in the right hemipelvis in the cerebellum and inferior pubic ramus. This is mostly indicative of Paget's disease. Patient does not complain of any pain.  Patient continues  to refuse bone marrow biopsy for further workup.   Return to clinic in 8 weeks with labs (CBC, CMET, Myeloma panel)/MD assessment and Port Flushed.   Orders Placed This Encounter  Procedures  . DG Bone Density    Standing Status:   Future    Standing Expiration Date:   04/30/2018    Order Specific Question:   Reason for Exam (SYMPTOM  OR DIAGNOSIS REQUIRED)    Answer:   Last Dexa was 05/2015. On arimedex.    Order Specific Question:   Preferred imaging location?    Answer:   [facility]    All questions were answered. The patient knows to call the clinic with any problems, questions or concerns.   This note was electronically signed.  Jacquelin Hawking, NP  05/03/2017 3:04 PM

## 2017-04-26 NOTE — Patient Instructions (Signed)
[facility] at [facility]  Discharge Instructions:  You were seen by Jennifer Burns, NP, today. _______________________________________________________________  Thank you for choosing [facility] at [facility] to provide your oncology and hematology care.  To afford each patient quality time with our providers, please arrive at least 15 minutes before your scheduled appointment.  You need to re-schedule your appointment if you arrive 10 or more minutes late.  We strive to give you quality time with our providers, and arriving late affects you and other patients whose appointments are after yours.  Also, if you no show three or more times for appointments you may be dismissed from the clinic.  Again, thank you for choosing [facility] at [facility]. Our hope is that these requests will allow you access to exceptional care and in a timely manner. _______________________________________________________________  If you have questions after your visit, please contact our office at [phone] between the hours of 8:30 a.m. and 5:00 p.m. Voicemails left after 4:30 p.m. will not be returned until the following business day. _______________________________________________________________  For prescription refill requests, have your pharmacy contact our office. _______________________________________________________________  Recommendations made by the consultant and any test results will be sent to your referring physician. _______________________________________________________________ 

## 2017-04-26 NOTE — Progress Notes (Signed)
Carly Wood Sample presented for Portacath access and flush. Portacath located left chest wall accessed with  H 20 needle. Good blood return present. Portacath flushed with 65ml NS and 500U/24ml Heparin and needle removed intact. Procedure without incident. Patient tolerated procedure well.

## 2017-04-26 NOTE — Addendum Note (Signed)
Addended by: Donnie Aho on: 04/26/2017 04:42 PM   Modules accepted: Orders

## 2017-04-27 LAB — PROTEIN ELECTROPHORESIS, SERUM
A/G Ratio: 0.8 (ref 0.7–1.7)
Albumin ELP: 3.1 g/dL (ref 2.9–4.4)
Alpha-1-Globulin: 0.2 g/dL (ref 0.0–0.4)
Alpha-2-Globulin: 0.8 g/dL (ref 0.4–1.0)
Beta Globulin: 1.8 g/dL — ABNORMAL HIGH (ref 0.7–1.3)
GLOBULIN, TOTAL: 3.7 g/dL (ref 2.2–3.9)
Gamma Globulin: 1 g/dL (ref 0.4–1.8)
M-SPIKE, %: 0.7 g/dL — AB
TOTAL PROTEIN ELP: 6.8 g/dL (ref 6.0–8.5)

## 2017-04-27 LAB — IMMUNOFIXATION ELECTROPHORESIS
IGA: 1227 mg/dL — AB (ref 64–422)
IGM (IMMUNOGLOBULIN M), SRM: 14 mg/dL — AB (ref 26–217)
IgG (Immunoglobin G), Serum: 1166 mg/dL (ref 700–1600)
Total Protein ELP: 6.8 g/dL (ref 6.0–8.5)

## 2017-04-30 ENCOUNTER — Other Ambulatory Visit (HOSPITAL_COMMUNITY): Payer: Self-pay

## 2017-04-30 DIAGNOSIS — C50911 Malignant neoplasm of unspecified site of right female breast: Secondary | ICD-10-CM

## 2017-04-30 DIAGNOSIS — Z9011 Acquired absence of right breast and nipple: Secondary | ICD-10-CM

## 2017-04-30 MED ORDER — ANASTROZOLE 1 MG PO TABS: 1.0000 mg | ORAL_TABLET | Freq: Every day | ORAL | 3 refills | Status: AC

## 2017-04-30 NOTE — Telephone Encounter (Signed)
Received refill request from patients pharmacy for Arimidex. Reviewed with provider, chart checked and refilled.  

## 2017-05-03 DIAGNOSIS — D509 Iron deficiency anemia, unspecified: Secondary | ICD-10-CM | POA: Insufficient documentation

## 2017-05-04 ENCOUNTER — Telehealth (HOSPITAL_COMMUNITY): Payer: Self-pay

## 2017-05-04 NOTE — Telephone Encounter (Signed)
-----   Message from Jacquelin Hawking, NP sent at 05/03/2017  3:05 PM EST ----- Can we get this patient set up for one dose of IV feraheme if she is agreeable. She has never had IV iron in the past but would benefit from one dose. She may decline.   Thanks Sonia Baller

## 2017-05-04 NOTE — Telephone Encounter (Signed)
Called and spoke with patients sister, Deneise Lever. She states patient is agreeable to having the IV iron infusion. Message sent to Rulon Abide, NP and scheduling.

## 2017-05-10 ENCOUNTER — Inpatient Hospital Stay (HOSPITAL_COMMUNITY): Payer: Medicare Other | Attending: Internal Medicine

## 2017-05-10 ENCOUNTER — Encounter (HOSPITAL_COMMUNITY): Payer: Self-pay

## 2017-05-10 VITALS — BP 154/69 | HR 63 | Temp 97.7°F | Resp 18 | Wt 185.2 lb

## 2017-05-10 DIAGNOSIS — D509 Iron deficiency anemia, unspecified: Secondary | ICD-10-CM | POA: Diagnosis present

## 2017-05-10 MED ORDER — HEPARIN SOD (PORK) LOCK FLUSH 100 UNIT/ML IV SOLN
500.0000 [IU] | Freq: Once | INTRAVENOUS | Status: AC
  Administered 2017-05-10: 500 [IU] via INTRAVENOUS

## 2017-05-10 MED ORDER — SODIUM CHLORIDE 0.9 % IV SOLN
510.0000 mg | Freq: Once | INTRAVENOUS | Status: AC
  Administered 2017-05-10: 510 mg via INTRAVENOUS
  Filled 2017-05-10: qty 17

## 2017-05-10 MED ORDER — SODIUM CHLORIDE 0.9% FLUSH
10.0000 mL | INTRAVENOUS | Status: DC | PRN
  Administered 2017-05-10: 10 mL via INTRAVENOUS
  Filled 2017-05-10: qty 10

## 2017-05-10 MED ORDER — SODIUM CHLORIDE 0.9 % IV SOLN
INTRAVENOUS | Status: DC
  Administered 2017-05-10: 14:00:00 via INTRAVENOUS

## 2017-05-10 NOTE — Patient Instructions (Signed)
[facility] at [facility] Discharge Instructions  Received Feraheme infusion today. Follow-up as scheduled. Call clinic for any questions or concerns   Thank you for choosing [facility] at [facility] to provide your oncology and hematology care.  To afford each patient quality time with our provider, please arrive at least 15 minutes before your scheduled appointment time.   If you have a lab appointment with the Cancer Center please come in thru the  Main Entrance and check in at the main information desk  You need to re-schedule your appointment should you arrive 10 or more minutes late.  We strive to give you quality time with our providers, and arriving late affects you and other patients whose appointments are after yours.  Also, if you no show three or more times for appointments you may be dismissed from the clinic at the providers discretion.     Again, thank you for choosing [facility].  Our hope is that these requests will decrease the amount of time that you wait before being seen by our physicians.       _____________________________________________________________  Should you have questions after your visit to [facility], please contact our office at [phone] between the hours of 8:30 a.m. and 4:30 p.m.  Voicemails left after 4:30 p.m. will not be returned until the following business day.  For prescription refill requests, have your pharmacy contact our office.       Resources For Cancer Patients and their Caregivers ? American Cancer Society: Can assist with transportation, wigs, general needs, runs Look Good Feel Better.        1-888-227-6333 ? Cancer Care: Provides financial assistance, online support groups, medication/co-pay assistance.  1-800-813-HOPE (4673) ? [facility] Assists [place] cancer patients and their families through emotional , educational and  financial support.  336-427-4357 ? [place] DSS Where to apply for food stamps, Medicaid and utility assistance. 336-342-1394 ? [facility]: Transportation to medical appointments. 336-347-2287 ? Social Security Administration: May apply for disability if have a Stage IV cancer. 336-342-7796 1-800-772-1213 ? [place] Aging, Disability and Transit Services: Assists with nutrition, care and transit needs. 336-349-2343  Cancer Center Support Programs:   > Cancer Support Group  2nd Tuesday of the month 1pm-2pm, Journey Room   > Creative Journey  3rd Tuesday of the month 1130am-1pm, Journey Room    

## 2017-05-10 NOTE — Progress Notes (Signed)
Carly Wood tolerated Feraheme infusion well without complaints or incident. VSS upon discharge. Pt discharged self ambulatory in satisfactory condition accompanied by her sister

## 2017-05-12 MED ORDER — OCTREOTIDE ACETATE 30 MG IM KIT
PACK | INTRAMUSCULAR | Status: AC
  Filled 2017-05-12: qty 1

## 2017-05-25 ENCOUNTER — Other Ambulatory Visit (HOSPITAL_COMMUNITY): Payer: Self-pay | Admitting: Emergency Medicine

## 2017-05-25 DIAGNOSIS — C50919 Malignant neoplasm of unspecified site of unspecified female breast: Secondary | ICD-10-CM

## 2017-05-25 DIAGNOSIS — C50911 Malignant neoplasm of unspecified site of right female breast: Secondary | ICD-10-CM

## 2017-05-25 MED ORDER — NERATINIB MALEATE 40 MG PO TABS: ORAL_TABLET | ORAL | 0 refills | Status: DC

## 2017-05-25 NOTE — Progress Notes (Signed)
nerlynx refilled

## 2017-06-23 ENCOUNTER — Inpatient Hospital Stay (HOSPITAL_COMMUNITY): Payer: Medicare Other | Attending: Internal Medicine

## 2017-06-23 ENCOUNTER — Encounter (HOSPITAL_COMMUNITY): Payer: Self-pay

## 2017-06-23 ENCOUNTER — Ambulatory Visit (HOSPITAL_COMMUNITY): Payer: Medicare Other | Admitting: Hematology

## 2017-06-23 DIAGNOSIS — D509 Iron deficiency anemia, unspecified: Secondary | ICD-10-CM | POA: Diagnosis present

## 2017-06-23 DIAGNOSIS — C50911 Malignant neoplasm of unspecified site of right female breast: Secondary | ICD-10-CM

## 2017-06-23 LAB — COMPREHENSIVE METABOLIC PANEL
ALBUMIN: 2.9 g/dL — AB (ref 3.5–5.0)
ALK PHOS: 62 U/L (ref 38–126)
ALT: 13 U/L — AB (ref 14–54)
AST: 18 U/L (ref 15–41)
Anion gap: 11 (ref 5–15)
BILIRUBIN TOTAL: 0.5 mg/dL (ref 0.3–1.2)
BUN: 35 mg/dL — AB (ref 6–20)
CO2: 25 mmol/L (ref 22–32)
CREATININE: 2.94 mg/dL — AB (ref 0.44–1.00)
Calcium: 8.4 mg/dL — ABNORMAL LOW (ref 8.9–10.3)
Chloride: 104 mmol/L (ref 101–111)
GFR calc Af Amer: 16 mL/min — ABNORMAL LOW (ref >60)
GFR calc non Af Amer: 14 mL/min — ABNORMAL LOW (ref >60)
GLUCOSE: 118 mg/dL — AB (ref 65–99)
Potassium: 4.1 mmol/L (ref 3.5–5.1)
Sodium: 140 mmol/L (ref 135–145)
TOTAL PROTEIN: 7 g/dL (ref 6.5–8.1)

## 2017-06-23 LAB — CBC WITH DIFFERENTIAL/PLATELET
BASOS ABS: 0 10*3/uL (ref 0.0–0.1)
BASOS PCT: 0 %
Eosinophils Absolute: 0.2 10*3/uL (ref 0.0–0.7)
Eosinophils Relative: 3 %
HEMATOCRIT: 29.7 % — AB (ref 36.0–46.0)
HEMOGLOBIN: 9.5 g/dL — AB (ref 12.0–15.0)
LYMPHS PCT: 18 %
Lymphs Abs: 1.2 10*3/uL (ref 0.7–4.0)
MCH: 30.6 pg (ref 26.0–34.0)
MCHC: 32 g/dL (ref 30.0–36.0)
MCV: 95.8 fL (ref 78.0–100.0)
Monocytes Absolute: 0.3 10*3/uL (ref 0.1–1.0)
Monocytes Relative: 5 %
NEUTROS ABS: 5.1 10*3/uL (ref 1.7–7.7)
NEUTROS PCT: 74 %
Platelets: 277 10*3/uL (ref 150–400)
RBC: 3.1 MIL/uL — AB (ref 3.87–5.11)
RDW: 16.7 % — AB (ref 11.5–15.5)
WBC: 6.9 10*3/uL (ref 4.0–10.5)

## 2017-06-23 MED ORDER — HEPARIN SOD (PORK) LOCK FLUSH 100 UNIT/ML IV SOLN
500.0000 [IU] | Freq: Once | INTRAVENOUS | Status: AC
  Administered 2017-06-23: 500 [IU] via INTRAVENOUS

## 2017-06-23 MED ORDER — SODIUM CHLORIDE 0.9% FLUSH
20.0000 mL | INTRAVENOUS | Status: DC | PRN
  Administered 2017-06-23: 20 mL via INTRAVENOUS
  Filled 2017-06-23: qty 20

## 2017-06-23 NOTE — Patient Instructions (Signed)
[facility] at [facility] Discharge Instructions  Labs drawn from portacath then flushed per protocol Follow-up as scheduled. Call clinic for any questions or concerns   Thank you for choosing [facility] at [facility] to provide your oncology and hematology care.  To afford each patient quality time with our provider, please arrive at least 15 minutes before your scheduled appointment time.   If you have a lab appointment with the Cancer Center please come in thru the  Main Entrance and check in at the main information desk  You need to re-schedule your appointment should you arrive 10 or more minutes late.  We strive to give you quality time with our providers, and arriving late affects you and other patients whose appointments are after yours.  Also, if you no show three or more times for appointments you may be dismissed from the clinic at the providers discretion.     Again, thank you for choosing [facility].  Our hope is that these requests will decrease the amount of time that you wait before being seen by our physicians.       _____________________________________________________________  Should you have questions after your visit to  Cancer Center, please contact our office at [phone] between the hours of 8:30 a.m. and 4:30 p.m.  Voicemails left after 4:30 p.m. will not be returned until the following business day.  For prescription refill requests, have your pharmacy contact our office.       Resources For Cancer Patients and their Caregivers ? American Cancer Society: Can assist with transportation, wigs, general needs, runs Look Good Feel Better.        1-888-227-6333 ? Cancer Care: Provides financial assistance, online support groups, medication/co-pay assistance.  1-800-813-HOPE (4673) ? [facility] Assists [place] cancer patients and their families through emotional ,  educational and financial support.  336-427-4357 ? [place] DSS Where to apply for food stamps, Medicaid and utility assistance. 336-342-1394 ? [facility]: Transportation to medical appointments. 336-347-2287 ? Social Security Administration: May apply for disability if have a Stage IV cancer. 336-342-7796 1-800-772-1213 ? [place] Aging, Disability and Transit Services: Assists with nutrition, care and transit needs. 336-349-2343  Cancer Center Support Programs:   > Cancer Support Group  2nd Tuesday of the month 1pm-2pm, Journey Room   > Creative Journey  3rd Tuesday of the month 1130am-1pm, Journey Room    

## 2017-06-23 NOTE — Progress Notes (Signed)
Carly Wood tolerated port lab draw with flush well without complaints or incident. Port accessed with 20 gauge needle with blood drawn for labs ordered then flushed with 20 ml NS and 5 ml Heparin easily per protocol then de-accessed. VSS Pt discharged via wheelchair in satisfactory condition accompanied by family members

## 2017-06-24 LAB — KAPPA/LAMBDA LIGHT CHAINS
KAPPA FREE LGHT CHN: 84.6 mg/L — AB (ref 3.3–19.4)
KAPPA, LAMDA LIGHT CHAIN RATIO: 0.17 — AB (ref 0.26–1.65)
LAMDA FREE LIGHT CHAINS: 492.9 mg/L — AB (ref 5.7–26.3)

## 2017-06-24 LAB — IGG, IGA, IGM
IGA: 1152 mg/dL — AB (ref 64–422)
IGM (IMMUNOGLOBULIN M), SRM: 12 mg/dL — AB (ref 26–217)
IgG (Immunoglobin G), Serum: 936 mg/dL (ref 700–1600)

## 2017-06-24 LAB — BETA 2 MICROGLOBULIN, SERUM: Beta-2 Microglobulin: 6.9 mg/L — ABNORMAL HIGH (ref 0.6–2.4)

## 2017-06-25 LAB — MULTIPLE MYELOMA PANEL, SERUM
ALBUMIN SERPL ELPH-MCNC: 2.2 g/dL — AB (ref 2.9–4.4)
Albumin/Glob SerPl: 0.8 (ref 0.7–1.7)
Alpha 1: 0.2 g/dL (ref 0.0–0.4)
Alpha2 Glob SerPl Elph-Mcnc: 0.6 g/dL (ref 0.4–1.0)
B-Globulin SerPl Elph-Mcnc: 1.8 g/dL — ABNORMAL HIGH (ref 0.7–1.3)
Gamma Glob SerPl Elph-Mcnc: 0.5 g/dL (ref 0.4–1.8)
Globulin, Total: 3.1 g/dL (ref 2.2–3.9)
IGA: 1116 mg/dL — AB (ref 64–422)
IgG (Immunoglobin G), Serum: 928 mg/dL (ref 700–1600)
IgM (Immunoglobulin M), Srm: 11 mg/dL — ABNORMAL LOW (ref 26–217)
M Protein SerPl Elph-Mcnc: 0.5 g/dL — ABNORMAL HIGH
Total Protein ELP: 5.3 g/dL — ABNORMAL LOW (ref 6.0–8.5)

## 2017-06-30 ENCOUNTER — Other Ambulatory Visit: Payer: Self-pay

## 2017-06-30 ENCOUNTER — Encounter (HOSPITAL_COMMUNITY): Payer: Self-pay | Admitting: Hematology

## 2017-06-30 ENCOUNTER — Inpatient Hospital Stay (HOSPITAL_COMMUNITY): Payer: Medicare Other | Attending: Hematology | Admitting: Hematology

## 2017-06-30 ENCOUNTER — Ambulatory Visit (HOSPITAL_COMMUNITY): Payer: Medicare Other | Admitting: Hematology

## 2017-06-30 VITALS — BP 140/89 | HR 68 | Temp 98.4°F | Resp 18 | Wt 187.5 lb

## 2017-06-30 DIAGNOSIS — M8580 Other specified disorders of bone density and structure, unspecified site: Secondary | ICD-10-CM

## 2017-06-30 DIAGNOSIS — Z79811 Long term (current) use of aromatase inhibitors: Secondary | ICD-10-CM

## 2017-06-30 DIAGNOSIS — D472 Monoclonal gammopathy: Secondary | ICD-10-CM | POA: Diagnosis not present

## 2017-06-30 DIAGNOSIS — Z1231 Encounter for screening mammogram for malignant neoplasm of breast: Secondary | ICD-10-CM

## 2017-06-30 DIAGNOSIS — C50919 Malignant neoplasm of unspecified site of unspecified female breast: Secondary | ICD-10-CM

## 2017-06-30 DIAGNOSIS — Z170 Estrogen receptor positive status [ER+]: Secondary | ICD-10-CM | POA: Diagnosis not present

## 2017-06-30 DIAGNOSIS — Z029 Encounter for administrative examinations, unspecified: Secondary | ICD-10-CM | POA: Insufficient documentation

## 2017-06-30 DIAGNOSIS — D509 Iron deficiency anemia, unspecified: Secondary | ICD-10-CM | POA: Diagnosis not present

## 2017-06-30 DIAGNOSIS — C50911 Malignant neoplasm of unspecified site of right female breast: Secondary | ICD-10-CM

## 2017-06-30 NOTE — Patient Instructions (Signed)
[facility] at [facility] Discharge Instructions  Today you saw Dr. K.   Thank you for choosing [facility] at [facility] to provide your oncology and hematology care.  To afford each patient quality time with our provider, please arrive at least 15 minutes before your scheduled appointment time.   If you have a lab appointment with the Cancer Center please come in thru the  Main Entrance and check in at the main information desk  You need to re-schedule your appointment should you arrive 10 or more minutes late.  We strive to give you quality time with our providers, and arriving late affects you and other patients whose appointments are after yours.  Also, if you no show three or more times for appointments you may be dismissed from the clinic at the providers discretion.     Again, thank you for choosing [facility].  Our hope is that these requests will decrease the amount of time that you wait before being seen by our physicians.       _____________________________________________________________  Should you have questions after your visit to [facility], please contact our office at [phone] between the hours of 8:30 a.m. and 4:30 p.m.  Voicemails left after 4:30 p.m. will not be returned until the following business day.  For prescription refill requests, have your pharmacy contact our office.       Resources For Cancer Patients and their Caregivers ? American Cancer Society: Can assist with transportation, wigs, general needs, runs Look Good Feel Better.        1-888-227-6333 ? Cancer Care: Provides financial assistance, online support groups, medication/co-pay assistance.  1-800-813-HOPE (4673) ? [facility] Assists [place] cancer patients and their families through emotional , educational and financial support.  336-427-4357 ? [place] DSS Where to apply for food  stamps, Medicaid and utility assistance. 336-342-1394 ? [facility]: Transportation to medical appointments. 336-347-2287 ? Social Security Administration: May apply for disability if have a Stage IV cancer. 336-342-7796 1-800-772-1213 ? [place] [facility]ging, Disability and Transit Services: Assists with nutrition, care and transit needs. 336-349-2343  Cancer Center Support Programs:   > Cancer Support Group  2nd Tuesday of the month 1pm-2pm, Journey Room   > Creative Journey  3rd Tuesday of the month 1130am-1pm, Journey Room    

## 2017-06-30 NOTE — Progress Notes (Signed)
Patient Care Team: Monico Blitz, MD as PCP - General (Internal Medicine) Gala Romney Cristopher Estimable, MD as Consulting Physician (Gastroenterology)  DIAGNOSIS:  Encounter Diagnoses  Name Primary?  . Stage 1 infiltrating ductal carcinoma of right female breast ([place]) Yes  . HER2-positive carcinoma of breast ([place])   . Aromatase inhibitor use   . IgA gammopathy   . Encounter for screening mammogram for breast cancer     SUMMARY OF ONCOLOGIC HISTORY:   Stage 1 infiltrating ductal carcinoma of right female breast ([place])   09/12/2014 Mammogram    Mass in upper outer R breast, middle third depth appears slightly larger than it was on prior exam with more irreg spiculated margins      10/02/2014 Pathology Results    biopsy with invasive ductal carcinoma high grade 1.1 cm, dcis solid type. additional R breast tissue, excision, invasive ductal high grade 0.9 cm       10/24/2014 Pathology Results    no residual invasive carcinoma, DCIS, focal, atypical ductal hyperplasia, 0/7 LN positive for metastatic carcinoma ER > 90%, PR 30%, HER 2 2+      10/24/2014 Cancer Staging    [id number]      10/24/2014 Surgery    R mastectomy, T1c, N0      12/28/2014 - 11/22/2015 Chemotherapy    Taxol/herceptin weekly X 12, Herceptin every 21 days, last due in September 2017      03/11/2015 -  Anti-estrogen oral therapy    Arimidex 1 mg daily      09/12/2015 Imaging    MUGA- The left ventricular ejection fraction equals 68%.      06/08/2016 Treatment Plan Change    Started Nerlynx       CHIEF COMPLIANT:   INTERVAL HISTORY: Carly Wood is a 82 y.o. female with h/o Stage I triple positive right breast cancer. She was also found to have IgA MGUS.   Currently on Nerlynx.  She started in 06/2016. She was instructed to stop taking Nerlynx after finishing this current prescription.  Remains on Anastrozole; she will need to continue AI for 5-10 years.  Last unilateral left screening mammogram 08/17/16 negative for  malignancy.   Denies any new/recent breast or chest wall changes.    IgA MGUS is stable; kidney function is stable.    Family tells me that she has had DEXA scan at her PCP's office; it showed osteopenia per the family's report.  We will try to obtain this report.  She is taking calcium/vitamin D per her report     REVIEW OF SYSTEMS:   Constitutional: Denies fevers, chills or abnormal weight loss Eyes: Denies blurriness of vision Ears, nose, mouth, throat, and face: Denies mucositis or sore throat Respiratory: Denies cough, dyspnea or wheezes Cardiovascular: Denies palpitation, chest discomfort Gastrointestinal:  Denies nausea, heartburn or change in bowel habits Skin: Denies abnormal skin rashes Lymphatics: Denies new lymphadenopathy or easy bruising Neurological:Denies numbness, tingling or new weaknesses Behavioral/Psych: Mood is stable, no new changes  Extremities: No lower extremity edema Breast: Denies any left breast pain.  Denies any pain at the mastectomy site.  All other systems were reviewed with the patient and are negative.  I have reviewed the past medical history, past surgical history, social history and family history with the patient and they are unchanged from previous note.  ALLERGIES:  is allergic to penicillins; ace inhibitors; and lisinopril.  MEDICATIONS:  Current Outpatient Medications  Medication Sig Dispense Refill  . acetaminophen (TYLENOL) 500 MG tablet  Take 500 mg by mouth every 6 (six) hours as needed.     Marland Kitchen amLODipine (NORVASC) 5 MG tablet Take 5 mg by mouth daily.     Marland Kitchen anastrozole (ARIMIDEX) 1 MG tablet Take 1 tablet (1 mg total) by mouth daily. 90 tablet 3  . aspirin EC 81 MG tablet Take 81 mg by mouth daily.    . calcitRIOL (ROCALTROL) 0.25 MCG capsule Take 0.25 mcg by mouth daily.    . cloNIDine (CATAPRES) 0.3 MG tablet Take 0.3 mg by mouth 2 (two) times daily.     . Insulin Glargine (TOUJEO MAX SOLOSTAR) 300 UNIT/ML SOPN Inject 5 Units into the  skin every evening.    . iron polysaccharides (NIFEREX) 150 MG capsule Take 150 mg by mouth daily.    Marland Kitchen lidocaine-prilocaine (EMLA) cream Apply 1 application topically as needed. Apply to port site prior when needed for chemo, labs or port flushes. 30 g 2  . loperamide (LOPERAMIDE A-D) 2 MG tablet Days 1-14 take 2 tabs three times a day Days 15-56 take 2 tabs two times a day Days 57-365 take 2 tabs as needed (max dose 16 mg/day) 140 tablet 3  . nebivolol (BYSTOLIC) 10 MG tablet Take 10 mg by mouth daily.     . Neratinib Maleate (NERLYNX) 40 MG tablet TAKE 6 TABLETS (240MG) BY MOUTH ONE TIME DAILY WITH FOOD. 180 tablet 0  . ondansetron (ZOFRAN) 8 MG tablet Take 1 tablet (8 mg total) by mouth every 8 (eight) hours as needed for nausea or vomiting. 30 tablet 2  . PARoxetine (PAXIL) 20 MG tablet Take 20 mg by mouth daily.     . pioglitazone (ACTOS) 30 MG tablet Take 15 mg by mouth daily.     . pravastatin (PRAVACHOL) 20 MG tablet Take 20 mg by mouth daily. Reported on 08/30/2015    . prochlorperazine (COMPAZINE) 10 MG tablet Take 1 tablet (10 mg total) by mouth every 6 (six) hours as needed for nausea or vomiting. 30 tablet 2  . traZODone (DESYREL) 50 MG tablet Take 100 mg by mouth at bedtime.      No current facility-administered medications for this visit.     PHYSICAL EXAMINATION: ECOG PERFORMANCE STATUS: 1 - Symptomatic but completely ambulatory  Vitals:   06/30/17 1502  BP: 140/89  Pulse: 68  Resp: 18  Temp: 98.4 F (36.9 C)  SpO2: 100%   Filed Weights   06/30/17 1502  Weight: 187 lb 8 oz (85 kg)   *Required guarded assistance to get onto exam table.   GENERAL:alert, no distress and comfortable SKIN: skin color, texture, turgor are normal, no rashes or significant lesions EYES: normal, Conjunctiva are pink and non-injected, sclera clear OROPHARYNX:no mucositis, no erythema and lips, buccal mucosa, and tongue normal  NECK: supple, thyroid normal size, non-tender, without  nodularity LYMPH:  no palpable lymphadenopathy in the cervical, axillary or inguinal LUNGS: clear to auscultation and percussion with normal breathing effort HEART: regular rate & rhythm and no murmurs and no lower extremity edema ABDOMEN:abdomen soft, non-tender and normal bowel sounds MUSCULOSKELETAL:no cyanosis of digits and no clubbing   EXTREMITIES: No lower extremity edema BREAST: Right breast mastectomy site is within normal limits.  Left breast has no palpable masses.  No palpable adenopathy.  LABORATORY DATA:  I have reviewed the data as listed CMP Latest Ref Rng & Units 06/23/2017 04/26/2017 01/26/2017  Glucose 65 - 99 mg/dL 118(H) 107(H) 109(H)  BUN 6 - 20 mg/dL 35(H) 31(H) 30(H)  Creatinine 0.44 - 1.00 mg/dL 2.94(H) 2.89(H) 3.05(H)  Sodium 135 - 145 mmol/L 140 140 139  Potassium 3.5 - 5.1 mmol/L 4.1 4.3 3.7  Chloride 101 - 111 mmol/L 104 106 103  CO2 22 - 32 mmol/L '25 24 28  ' Calcium 8.9 - 10.3 mg/dL 8.4(L) 8.9 9.1  Total Protein 6.5 - 8.1 g/dL 7.0 7.5 7.2  Total Bilirubin 0.3 - 1.2 mg/dL 0.5 0.3 0.4  Alkaline Phos 38 - 126 U/L 62 60 56  AST 15 - 41 U/L '18 19 19  ' ALT 14 - 54 U/L 13(L) 12(L) 10(L)   No results found for: [id number]   Lab Results  Component Value Date   WBC 6.9 06/23/2017   HGB 9.5 (L) 06/23/2017   HCT 29.7 (L) 06/23/2017   MCV 95.8 06/23/2017   PLT 277 06/23/2017   NEUTROABS 5.1 06/23/2017    ASSESSMENT & PLAN:  Stage 1 infiltrating ductal carcinoma of right female breast (HCC) 1. Stage I (T1CN0M0) invasive ductal carcinoma of the right breast, ER/PR+, HER2+: -S/P right mastectomy on 10/24/2014, 1.1 cm, followed by adjuvant chemotherapy consisting of weekly Taxol/Herceptin from 12/28/2014. Herceptin x 52 weeks completed on 11/22/2015.  Anastrozole started on 03/11/2015.  She was also started on extended adjuvant therapy with neratinib on 06/08/2016. -Her mastectomy site on the right side today is within normal limits.  Left breast is not have any palpable  masses.  We will schedule her for mammogram in June.  I have told her to discontinue neratinib as she has completed 1 year of treatment.  We will also order a bone density test prior to next visit.  2.  IgA lambda MGUS: -M spike was 0.5.  Kappa to lambda ratio was 0.17.  Lambda light chains were slightly high at 492, up from 451.  We will continue to monitor it every 6 months.  3.  CKD: This is stable with a creatinine around 2.9.   Breast Cancer therapy associated bone loss: I have recommended calcium, Vitamin D and weight bearing exercises.  I spent 25 minutes talking to the patient of which more than half was spent in counseling and coordination of care.  Orders Placed This Encounter  Procedures  . MM 3D SCREEN BREAST UNI LEFT    Standing Status:   Future    Standing Expiration Date:   12/31/2018    Order Specific Question:   Reason for Exam (SYMPTOM  OR DIAGNOSIS REQUIRED)    Answer:   annual screening exam; h/o right breast cancer s/p mastectomy    Order Specific Question:   Preferred imaging location?    Answer:   [facility]  . CBC with Differential/Platelet    Standing Status:   Future    Standing Expiration Date:   07/01/2018  . Comprehensive metabolic panel    Standing Status:   Future    Standing Expiration Date:   07/01/2018    Order Specific Question:   Has the patient fasted?    Answer:   No  . Multiple Myeloma Panel (SPEP&IFE w/QIG)    Standing Status:   Future    Standing Expiration Date:   07/01/2018  . Kappa/lambda light chains    Standing Status:   Future    Standing Expiration Date:   07/01/2018   The patient has a good understanding of the overall plan. she agrees with it. she will call with any problems that may develop before the next visit here.  Derek Jack, MD 06/30/17

## 2017-06-30 NOTE — Assessment & Plan Note (Signed)
1. Stage I (T1CN0M0) invasive ductal carcinoma of the right breast, ER/PR+, HER2+: -S/P right mastectomy on 10/24/2014, 1.1 cm, followed by adjuvant chemotherapy consisting of weekly Taxol/Herceptin from 12/28/2014. Herceptin x 52 weeks completed on 11/22/2015.  Anastrozole started on 03/11/2015.  She was also started on extended adjuvant therapy with neratinib on 06/08/2016. -Her mastectomy site on the right side today is within normal limits.  Left breast is not have any palpable masses.  We will schedule her for mammogram in June.  I have told her to discontinue neratinib as she has completed 1 year of treatment.  We will also order a bone density test prior to next visit.  2.  IgA lambda MGUS: -M spike was 0.5.  Kappa to lambda ratio was 0.17.  Lambda light chains were slightly high at 492, up from 451.  We will continue to monitor it every 6 months.  3.  CKD: This is stable with a creatinine around 2.9.   

## 2017-07-05 ENCOUNTER — Other Ambulatory Visit (HOSPITAL_COMMUNITY): Payer: Self-pay

## 2017-07-05 DIAGNOSIS — C50919 Malignant neoplasm of unspecified site of unspecified female breast: Secondary | ICD-10-CM

## 2017-07-05 DIAGNOSIS — C50911 Malignant neoplasm of unspecified site of right female breast: Secondary | ICD-10-CM

## 2017-07-05 MED ORDER — NERATINIB MALEATE 40 MG PO TABS: ORAL_TABLET | ORAL | 0 refills | Status: DC

## 2017-07-05 MED ORDER — NERATINIB MALEATE 40 MG PO TABS: 240.0000 mg | ORAL_TABLET | Freq: Every day | ORAL | 0 refills | Status: DC

## 2017-08-18 ENCOUNTER — Ambulatory Visit (HOSPITAL_COMMUNITY)
Admission: RE | Admit: 2017-08-18 | Discharge: 2017-08-18 | Disposition: A | Discharge status: Home / Self Care | Payer: Medicare Other | Source: Ambulatory Visit | Attending: Adult Health | Admitting: Adult Health

## 2017-08-18 ENCOUNTER — Encounter (HOSPITAL_COMMUNITY): Payer: Self-pay

## 2017-08-18 DIAGNOSIS — Z1231 Encounter for screening mammogram for malignant neoplasm of breast: Secondary | ICD-10-CM | POA: Diagnosis present

## 2017-09-22 ENCOUNTER — Encounter (HOSPITAL_COMMUNITY): Payer: Medicare Other

## 2017-09-28 ENCOUNTER — Other Ambulatory Visit: Payer: Self-pay

## 2017-09-28 ENCOUNTER — Inpatient Hospital Stay (HOSPITAL_COMMUNITY): Payer: Medicare Other | Attending: Internal Medicine

## 2017-09-28 VITALS — BP 136/61 | HR 69 | Temp 97.7°F | Resp 16

## 2017-09-28 DIAGNOSIS — Z452 Encounter for adjustment and management of vascular access device: Secondary | ICD-10-CM | POA: Diagnosis not present

## 2017-09-28 DIAGNOSIS — Z95828 Presence of other vascular implants and grafts: Secondary | ICD-10-CM

## 2017-09-28 DIAGNOSIS — C50411 Malignant neoplasm of upper-outer quadrant of right female breast: Secondary | ICD-10-CM | POA: Diagnosis not present

## 2017-09-28 DIAGNOSIS — N184 Chronic kidney disease, stage 4 (severe): Secondary | ICD-10-CM

## 2017-09-28 MED ORDER — HEPARIN SOD (PORK) LOCK FLUSH 100 UNIT/ML IV SOLN
500.0000 [IU] | Freq: Once | INTRAVENOUS | Status: AC
  Administered 2017-09-28: 500 [IU] via INTRAVENOUS
  Filled 2017-09-28: qty 5

## 2017-09-28 MED ORDER — SODIUM CHLORIDE 0.9% FLUSH
10.0000 mL | INTRAVENOUS | Status: DC | PRN
  Administered 2017-09-28: 10 mL via INTRAVENOUS
  Filled 2017-09-28: qty 10

## 2017-10-26 ENCOUNTER — Inpatient Hospital Stay (HOSPITAL_COMMUNITY): Payer: Medicare Other | Attending: Hematology

## 2017-10-26 ENCOUNTER — Other Ambulatory Visit (HOSPITAL_COMMUNITY): Payer: Self-pay

## 2017-10-26 DIAGNOSIS — I129 Hypertensive chronic kidney disease with stage 1 through stage 4 chronic kidney disease, or unspecified chronic kidney disease: Secondary | ICD-10-CM | POA: Insufficient documentation

## 2017-10-26 DIAGNOSIS — N184 Chronic kidney disease, stage 4 (severe): Secondary | ICD-10-CM | POA: Diagnosis not present

## 2017-10-26 DIAGNOSIS — K5900 Constipation, unspecified: Secondary | ICD-10-CM | POA: Diagnosis not present

## 2017-10-26 DIAGNOSIS — Z853 Personal history of malignant neoplasm of breast: Secondary | ICD-10-CM | POA: Insufficient documentation

## 2017-10-26 DIAGNOSIS — D472 Monoclonal gammopathy: Secondary | ICD-10-CM

## 2017-10-26 DIAGNOSIS — D509 Iron deficiency anemia, unspecified: Secondary | ICD-10-CM | POA: Insufficient documentation

## 2017-10-26 DIAGNOSIS — E1122 Type 2 diabetes mellitus with diabetic chronic kidney disease: Secondary | ICD-10-CM | POA: Diagnosis not present

## 2017-10-26 DIAGNOSIS — C50919 Malignant neoplasm of unspecified site of unspecified female breast: Secondary | ICD-10-CM

## 2017-10-26 DIAGNOSIS — Z79811 Long term (current) use of aromatase inhibitors: Secondary | ICD-10-CM

## 2017-10-26 DIAGNOSIS — M8580 Other specified disorders of bone density and structure, unspecified site: Secondary | ICD-10-CM | POA: Insufficient documentation

## 2017-10-26 DIAGNOSIS — C50911 Malignant neoplasm of unspecified site of right female breast: Secondary | ICD-10-CM

## 2017-10-26 LAB — COMPREHENSIVE METABOLIC PANEL
ALT: 9 U/L (ref 0–44)
ANION GAP: 7 (ref 5–15)
AST: 17 U/L (ref 15–41)
Albumin: 3.2 g/dL — ABNORMAL LOW (ref 3.5–5.0)
Alkaline Phosphatase: 61 U/L (ref 38–126)
BUN: 36 mg/dL — ABNORMAL HIGH (ref 8–23)
CHLORIDE: 106 mmol/L (ref 98–111)
CO2: 26 mmol/L (ref 22–32)
Calcium: 8.8 mg/dL — ABNORMAL LOW (ref 8.9–10.3)
Creatinine, Ser: 4.09 mg/dL — ABNORMAL HIGH (ref 0.44–1.00)
GFR calc non Af Amer: 10 mL/min — ABNORMAL LOW (ref >60)
GFR, EST AFRICAN AMERICAN: 11 mL/min — AB (ref >60)
GLUCOSE: 148 mg/dL — AB (ref 70–99)
POTASSIUM: 4.4 mmol/L (ref 3.5–5.1)
SODIUM: 139 mmol/L (ref 135–145)
Total Bilirubin: 0.3 mg/dL (ref 0.3–1.2)
Total Protein: 7.2 g/dL (ref 6.5–8.1)

## 2017-10-26 LAB — CBC WITH DIFFERENTIAL/PLATELET
BASOS ABS: 0 10*3/uL (ref 0.0–0.1)
Basophils Relative: 0 %
Eosinophils Absolute: 0.2 10*3/uL (ref 0.0–0.7)
Eosinophils Relative: 3 %
HEMATOCRIT: 28 % — AB (ref 36.0–46.0)
HEMOGLOBIN: 9 g/dL — AB (ref 12.0–15.0)
LYMPHS PCT: 16 %
Lymphs Abs: 1 10*3/uL (ref 0.7–4.0)
MCH: 30.4 pg (ref 26.0–34.0)
MCHC: 32.1 g/dL (ref 30.0–36.0)
MCV: 94.6 fL (ref 78.0–100.0)
Monocytes Absolute: 0.3 10*3/uL (ref 0.1–1.0)
Monocytes Relative: 5 %
NEUTROS ABS: 4.8 10*3/uL (ref 1.7–7.7)
NEUTROS PCT: 76 %
Platelets: 232 10*3/uL (ref 150–400)
RBC: 2.96 MIL/uL — ABNORMAL LOW (ref 3.87–5.11)
RDW: 15.6 % — AB (ref 11.5–15.5)
WBC: 6.3 10*3/uL (ref 4.0–10.5)

## 2017-10-27 ENCOUNTER — Other Ambulatory Visit (HOSPITAL_COMMUNITY): Payer: Medicare Other

## 2017-10-27 ENCOUNTER — Ambulatory Visit (HOSPITAL_COMMUNITY): Payer: Medicare Other | Admitting: Internal Medicine

## 2017-10-27 LAB — KAPPA/LAMBDA LIGHT CHAINS
KAPPA FREE LGHT CHN: 117.6 mg/L — AB (ref 3.3–19.4)
Kappa, lambda light chain ratio: 0.18 — ABNORMAL LOW (ref 0.26–1.65)
LAMDA FREE LIGHT CHAINS: 651 mg/L — AB (ref 5.7–26.3)

## 2017-10-28 LAB — MULTIPLE MYELOMA PANEL, SERUM
ALBUMIN SERPL ELPH-MCNC: 3.2 g/dL (ref 2.9–4.4)
ALBUMIN/GLOB SERPL: 1 (ref 0.7–1.7)
ALPHA2 GLOB SERPL ELPH-MCNC: 0.8 g/dL (ref 0.4–1.0)
Alpha 1: 0.2 g/dL (ref 0.0–0.4)
B-Globulin SerPl Elph-Mcnc: 1.7 g/dL — ABNORMAL HIGH (ref 0.7–1.3)
Gamma Glob SerPl Elph-Mcnc: 0.8 g/dL (ref 0.4–1.8)
Globulin, Total: 3.5 g/dL (ref 2.2–3.9)
IGG (IMMUNOGLOBIN G), SERUM: 963 mg/dL (ref 700–1600)
IGM (IMMUNOGLOBULIN M), SRM: 12 mg/dL — AB (ref 26–217)
IgA: 1230 mg/dL — ABNORMAL HIGH (ref 64–422)
M Protein SerPl Elph-Mcnc: 0.9 g/dL — ABNORMAL HIGH
TOTAL PROTEIN ELP: 6.7 g/dL (ref 6.0–8.5)

## 2017-11-03 ENCOUNTER — Encounter (HOSPITAL_COMMUNITY): Payer: Self-pay | Admitting: Internal Medicine

## 2017-11-03 ENCOUNTER — Inpatient Hospital Stay (HOSPITAL_COMMUNITY): Payer: Medicare Other | Admitting: Internal Medicine

## 2017-11-03 VITALS — BP 151/64 | HR 63 | Temp 97.8°F | Resp 16 | Wt 190.3 lb

## 2017-11-03 DIAGNOSIS — C50919 Malignant neoplasm of unspecified site of unspecified female breast: Secondary | ICD-10-CM

## 2017-11-03 DIAGNOSIS — Z853 Personal history of malignant neoplasm of breast: Secondary | ICD-10-CM

## 2017-11-03 DIAGNOSIS — Z1731 Human epidermal growth factor receptor 2 positive status: Secondary | ICD-10-CM

## 2017-11-03 DIAGNOSIS — E1122 Type 2 diabetes mellitus with diabetic chronic kidney disease: Secondary | ICD-10-CM

## 2017-11-03 DIAGNOSIS — M8580 Other specified disorders of bone density and structure, unspecified site: Secondary | ICD-10-CM | POA: Diagnosis not present

## 2017-11-03 DIAGNOSIS — N184 Chronic kidney disease, stage 4 (severe): Secondary | ICD-10-CM

## 2017-11-03 DIAGNOSIS — D472 Monoclonal gammopathy: Secondary | ICD-10-CM | POA: Diagnosis not present

## 2017-11-03 DIAGNOSIS — K5900 Constipation, unspecified: Secondary | ICD-10-CM

## 2017-11-03 DIAGNOSIS — I129 Hypertensive chronic kidney disease with stage 1 through stage 4 chronic kidney disease, or unspecified chronic kidney disease: Secondary | ICD-10-CM

## 2017-11-03 DIAGNOSIS — D509 Iron deficiency anemia, unspecified: Secondary | ICD-10-CM

## 2017-11-03 NOTE — Patient Instructions (Signed)
[facility] at [facility] Discharge Instructions  You saw Dr. Higgs today.   Thank you for choosing [facility] at [facility] to provide your oncology and hematology care.  To afford each patient quality time with our provider, please arrive at least 15 minutes before your scheduled appointment time.   If you have a lab appointment with the Cancer Center please come in thru the  Main Entrance and check in at the main information desk  You need to re-schedule your appointment should you arrive 10 or more minutes late.  We strive to give you quality time with our providers, and arriving late affects you and other patients whose appointments are after yours.  Also, if you no show three or more times for appointments you may be dismissed from the clinic at the providers discretion.     Again, thank you for choosing [facility].  Our hope is that these requests will decrease the amount of time that you wait before being seen by our physicians.       _____________________________________________________________  Should you have questions after your visit to [facility], please contact our office at [phone] between the hours of 8:00 a.m. and 4:30 p.m.  Voicemails left after 4:00 p.m. will not be returned until the following business day.  For prescription refill requests, have your pharmacy contact our office and allow 72 hours.    Cancer Center Support Programs:   > Cancer Support Group  2nd Tuesday of the month 1pm-2pm, Journey Room    

## 2017-11-03 NOTE — Progress Notes (Signed)
Diagnosis HER2-positive carcinoma of breast ([place]) - Plan: CBC with Differential/Platelet, Comprehensive metabolic panel, Lactate dehydrogenase, Protein electrophoresis, serum, Ferritin, IgG, IgA, IgM, Beta 2 microglobulin, serum, Kappa/lambda light chains  IgA gammopathy - Plan: CBC with Differential/Platelet, Comprehensive metabolic panel, Lactate dehydrogenase, Protein electrophoresis, serum, Ferritin, IgG, IgA, IgM, Beta 2 microglobulin, serum, Kappa/lambda light chains  Staging Cancer Staging Stage 1 infiltrating ductal carcinoma of right female breast [facility]) Staging form: Breast, AJCC 7th Edition - Clinical stage from 08/30/2015: Stage IA (T1c, N0, M0) - Signed by Baird Cancer, PA-C on 08/30/2015   Assessment and Plan: 1. Stage I (T1CN0M0) invasive ductal carcinoma of the right breast, ER/PR+, HER2+.  Pt is S/P right mastectomy on 10/24/2014, 1.1 cm, followed by adjuvant chemotherapy consisting of weekly Taxol/Herceptin from 12/28/2014. Herceptin x 52 weeks completed on 11/22/2015.  Anastrozole started on 03/11/2015.  She was also started on extended adjuvant therapy with neratinib on 06/08/2016.  She was seen by Dr. Worthy Keeler 06/2017  And recommended Neratinib be Discontinued as she had completed 1 year of therapy.    Pt has left screening mammogram done 08/18/2017 that was negative.  She is recommended for left screening mammogram in 08/2018.    Bone density done 06/03/2017 reviewed and showed evidence of osteopenia.  Continue Arimidex and pt is planned for 5-10 years of therapy.  Continue Calcium and vitamin D.  She will RTC in 04/2018 for follow-up with labs.    2.  IgA lambda MGUS.  Pt was reportedly recommended for bone marrow biopsy by Dr. Talbert Cage but refused.  I have reviewed labs done 10/26/2017 that showed M spike has increased to 0.9 g/dl.  FLC ratio is 0.18 which is stable with labs done 06/2017.  I have discussed with them Cr has also increased at 4.  She is reportedly planned for dialysis.   Last skeletal survey was done 05/2016 and showed question of Paget's disease.  I have discussed with them options for ongoing monitoring but protein level Is rising along with Cr.  She also has evidence of anemia with HB of 9.  She will have repeat labs in 04/2018.  Follow-up with nephrology as recommended.    3.  CKD.  Cr has increased to 4.09.  Pt is reportedly planned to begin dialysis.  Follow-up with nephrology as recommended.    4.  Osteopenia.  Dexa done 06/03/2017 shows osteopenia.  Pt should continue Calcium and vitamin D.  Will repeat BMD in 05/2019.    5.  HTN.  BP is 151/64.  Follow-up with PCP.    6.  DM.  Follow-up with PCP.    7.  Constipation.  Stool softeners recommended.    8.  Anemia.  HB 9.  Pt has CKD with Cr of 4.  Follow-up with nephrology.  EPO per nephrology.    30 minutes spent with more than 50% spent in counseling and coordination of care.    Interval History:  Historical data obtained from the note dated 06/30/2017.  1.  Stage I (T1CN0M0) invasive ductal carcinoma of the right breast, ER/PR+, HER2+.  Pt is S/P right mastectomy on 10/24/2014, 1.1 cm, followed by adjuvant chemotherapy consisting of weekly Taxol/Herceptin from 12/28/2014. Herceptin x 52 weeks completed on 11/22/2015.  Anastrozole started on 03/11/2015.  She was also started on extended adjuvant therapy with neratinib on 06/08/2016.  2.  IgA lambda MGUS.  Pt was previously recommended for bone marrow biopsy by Dr. Talbert Cage but refused to have procedure.  Current Status:  Pt is seen today for follow-up.  She is accompanied by family member.  She is here to go over labs and bone density.       Stage 1 infiltrating ductal carcinoma of right female breast ([place])   09/12/2014 Mammogram    Mass in upper outer R breast, middle third depth appears slightly larger than it was on prior exam with more irreg spiculated margins    10/02/2014 Pathology Results    biopsy with invasive ductal carcinoma high grade 1.1 cm,  dcis solid type. additional R breast tissue, excision, invasive ductal high grade 0.9 cm     10/24/2014 Pathology Results    no residual invasive carcinoma, DCIS, focal, atypical ductal hyperplasia, 0/7 LN positive for metastatic carcinoma ER > 90%, PR 30%, HER 2 2+    10/24/2014 Cancer Staging    T1cN0M0    10/24/2014 Surgery    R mastectomy, T1c, N0    12/28/2014 - 11/22/2015 Chemotherapy    Taxol/herceptin weekly X 12, Herceptin every 21 days, last due in September 2017    03/11/2015 -  Anti-estrogen oral therapy    Arimidex 1 mg daily    09/12/2015 Imaging    MUGA- The left ventricular ejection fraction equals 68%.    06/08/2016 Treatment Plan Change    Started Nerlynx      Problem List Patient Active Problem List   Diagnosis Date Noted  . Iron deficiency anemia [D50.9] 05/03/2017  . Multiple gastric ulcers [K25.9] 11/10/2016  . Dysphagia [R13.10] 07/31/2016  . GERD (gastroesophageal reflux disease) [K21.9] 07/31/2016  . Abnormal weight loss [R63.4] 07/31/2016  . Chronic renal disease, stage 4, severely decreased glomerular filtration rate (GFR) between 15-29 mL/min/1.73 square meter ([place]) [N18.4] 08/22/2015  . Aromatase inhibitor use [Z79.811] 08/22/2015  . Lymphedema of arm [I89.0] 08/22/2015  . Status post right mastectomy [Z90.11] 08/22/2015  . Osteopenia determined by x-ray [M85.80] 08/22/2015  . Stage 1 infiltrating ductal carcinoma of right female breast [facility]) [C50.911] 08/21/2015    Past Medical History Past Medical History:  Diagnosis Date  . Anemia   . Anxiety   . Chronic kidney disease   . Chronic renal disease, stage 4, severely decreased glomerular filtration rate (GFR) between 15-29 mL/min/1.73 square meter ([facility]) 08/22/2015  . Depression   . Diabetes mellitus without complication ([place])   . Hypertension   . Stage 1 infiltrating ductal carcinoma of right female breast ([place]) 08/21/2015   ER+ PR+ HER 2 neu + (3+) T1cN0     Past Surgical History Past Surgical  History:  Procedure Laterality Date  . BIOPSY  08/07/2016   Procedure: BIOPSY;  Surgeon: Daneil Dolin, MD;  [facility]: [facility];  Service: Endoscopy;;  gastric ulcer biopsy  . ESOPHAGOGASTRODUODENOSCOPY N/A 08/07/2016   Procedure: ESOPHAGOGASTRODUODENOSCOPY (EGD);  Surgeon: Daneil Dolin, MD;  Location: [facility];  Service: Endoscopy;  Laterality: N/A;  1215-rescheduled to 6/1 @ 2:30pm per Tretha Sciara  . ESOPHAGOGASTRODUODENOSCOPY N/A 11/27/2016   Procedure: ESOPHAGOGASTRODUODENOSCOPY (EGD);  Surgeon: Daneil Dolin, MD;  Location: [facility];  Service: Endoscopy;  Laterality: N/A;  8:15am  . FRACTURE SURGERY Right    ankle  . MALONEY DILATION N/A 08/07/2016   Procedure: Venia Minks DILATION;  Surgeon: Daneil Dolin, MD;  Location: [facility];  Service: Endoscopy;  Laterality: N/A;  . MASTECTOMY, PARTIAL    . RETINAL DETACHMENT SURGERY Right     Family History Family History  Problem Relation Age of Onset  . Multiple  myeloma Sister   . Brain cancer Sister   . Dementia Mother        died at 52  . Stroke Mother   . Heart failure Mother   . Diabetes Mother   . Heart disease Father   . Prostate cancer Brother   . Colon cancer Neg Hx      Social History  reports that she has never smoked. She has never used smokeless tobacco. She reports that she does not drink alcohol or use drugs.  Medications  Current Outpatient Medications:  .  acetaminophen (TYLENOL) 500 MG tablet, Take 500 mg by mouth every 6 (six) hours as needed. , Disp: , Rfl:  .  Alcohol Swabs (ALCOHOL PREP) 70 % PADS, See admin instructions., Disp: , Rfl: 12 .  amLODipine (NORVASC) 10 MG tablet, Take 10 mg by mouth daily., Disp: , Rfl: 4 .  amLODipine (NORVASC) 5 MG tablet, Take 5 mg by mouth daily. , Disp: , Rfl:  .  anastrozole (ARIMIDEX) 1 MG tablet, Take 1 tablet (1 mg total) by mouth daily., Disp: 90 tablet, Rfl: 3 .  anti-nausea (EMETROL) solution, Take by mouth., Disp: , Rfl:  .  AQUALANCE LANCETS 30G  MISC, USE TO test twice daily, Disp: , Rfl: 99 .  aspirin EC 81 MG tablet, Take 81 mg by mouth daily., Disp: , Rfl:  .  calcitRIOL (ROCALTROL) 0.25 MCG capsule, Take 0.25 mcg by mouth daily., Disp: , Rfl:  .  calcium-vitamin D (OSCAL 500/200 D-3) 500-200 MG-UNIT tablet, Take by mouth., Disp: , Rfl:  .  cloNIDine (CATAPRES) 0.3 MG tablet, Take 0.3 mg by mouth 2 (two) times daily. , Disp: , Rfl:  .  docusate sodium (COLACE) 100 MG capsule, Take by mouth., Disp: , Rfl:  .  esomeprazole (NEXIUM) 40 MG capsule, Take by mouth., Disp: , Rfl:  .  glimepiride (AMARYL) 4 MG tablet, Take by mouth., Disp: , Rfl:  .  Insulin Glargine (TOUJEO MAX SOLOSTAR) 300 UNIT/ML SOPN, Inject 5 Units into the skin every evening., Disp: , Rfl:  .  iron polysaccharides (NIFEREX) 150 MG capsule, Take 150 mg by mouth daily., Disp: , Rfl:  .  Lancet Devices (ADJUSTABLE LANCING DEVICE) MISC, TO check blood glucose, Disp: , Rfl: 1 .  lidocaine-prilocaine (EMLA) cream, Apply 1 application topically as needed. Apply to port site prior when needed for chemo, labs or port flushes., Disp: 30 g, Rfl: 2 .  loperamide (LOPERAMIDE A-D) 2 MG tablet, Days 1-14 take 2 tabs three times a day Days 15-56 take 2 tabs two times a day Days 57-365 take 2 tabs as needed (max dose 16 mg/day), Disp: 140 tablet, Rfl: 3 .  LORazepam (ATIVAN) 0.5 MG tablet, Take by mouth., Disp: , Rfl:  .  nebivolol (BYSTOLIC) 10 MG tablet, Take 10 mg by mouth daily. , Disp: , Rfl:  .  Neratinib Maleate (NERLYNX) 40 MG tablet, Take 6 tablets (240 mg total) by mouth daily. Take with food., Disp: 180 tablet, Rfl: 0 .  ondansetron (ZOFRAN) 8 MG tablet, Take 1 tablet (8 mg total) by mouth every 8 (eight) hours as needed for nausea or vomiting., Disp: 30 tablet, Rfl: 2 .  ONGLYZA 5 MG TABS tablet, Take 5 mg by mouth daily., Disp: , Rfl: 11 .  PARoxetine (PAXIL) 20 MG tablet, Take 20 mg by mouth daily. , Disp: , Rfl:  .  pioglitazone (ACTOS) 30 MG tablet, Take 15 mg by mouth  daily. , Disp: , Rfl:  .  pravastatin (PRAVACHOL) 20 MG tablet, Take 20 mg by mouth daily. Reported on 08/30/2015, Disp: , Rfl:  .  prochlorperazine (COMPAZINE) 10 MG tablet, Take 1 tablet (10 mg total) by mouth every 6 (six) hours as needed for nausea or vomiting., Disp: 30 tablet, Rfl: 2 .  traZODone (DESYREL) 100 MG tablet, Take 100 mg by mouth at bedtime., Disp: , Rfl: 2  Allergies Penicillins; Ace inhibitors; and Lisinopril  Review of Systems Review of Systems - Oncology ROS negative other than constipation   Physical Exam  Vitals Wt Readings from Last 3 Encounters:  11/03/17 190 lb 4.8 oz (86.3 kg)  06/30/17 187 lb 8 oz (85 kg)  05/10/17 185 lb 3.2 oz (84 kg)   Temp Readings from Last 3 Encounters:  11/03/17 97.8 F (36.6 C) (Oral)  09/28/17 97.7 F (36.5 C) (Oral)  06/30/17 98.4 F (36.9 C) (Oral)   BP Readings from Last 3 Encounters:  11/03/17 (!) 151/64  09/28/17 136/61  06/30/17 140/89   Pulse Readings from Last 3 Encounters:  11/03/17 63  09/28/17 69  06/30/17 68   Constitutional: Well-developed, well-nourished, and in no distress.   HENT: Head: Normocephalic and atraumatic.  Mouth/Throat: No oropharyngeal exudate. Mucosa moist. Eyes: Pupils are equal, round, and reactive to light. Conjunctivae are normal. No scleral icterus.  Neck: Normal range of motion. Neck supple. No JVD present.  Cardiovascular: Normal rate, regular rhythm and normal heart sounds.  Exam reveals no gallop and no friction rub.   No murmur heard. Pulmonary/Chest: Effort normal and breath sounds normal. No respiratory distress. No wheezes.No rales.  Abdominal: Soft. Bowel sounds are normal. No distension. There is no tenderness. There is no guarding.  Musculoskeletal: No edema or tenderness.  Lymphadenopathy: No cervical, axillary or supraclavicular adenopathy.  Neurological: Alert and oriented to person, place, and time. No cranial nerve deficit.  Skin: Skin is warm and dry. No rash  noted. No erythema. No pallor.  Psychiatric: Affect and judgment normal.  Breast exam:  Right mastectomy healed.  No palpable nodules noted.  Left breast showed on palpable dominant masses.    Labs No visits with results within 3 Day(s) from this visit.  Latest known visit with results is:  Orders Only on 10/26/2017  Component Date Value Ref Range Status  . Kappa free light chain 10/26/2017 117.6* 3.3 - 19.4 mg/L Final  . Lamda free light chains 10/26/2017 651.0* 5.7 - 26.3 mg/L Final  . Kappa, lamda light chain ratio 10/26/2017 0.18* 0.26 - 1.65 Final   Comment: (NOTE) Performed At: [facility] [place], [place] [id number] Rush Farmer MD [id number]   . IgG (Immunoglobin G), Serum 10/26/2017 963  700 - 1,600 mg/dL Final  . IgA 10/26/2017 1,230* 64 - 422 mg/dL Final   Comment: (NOTE) Results confirmed on dilution.   . IgM (Immunoglobulin M), Srm 10/26/2017 12* 26 - 217 mg/dL Final   Result confirmed on concentration.  . Total Protein ELP 10/26/2017 6.7  6.0 - 8.5 g/dL Corrected  . Albumin SerPl Elph-Mcnc 10/26/2017 3.2  2.9 - 4.4 g/dL Corrected  . Alpha 1 10/26/2017 0.2  0.0 - 0.4 g/dL Corrected  . Alpha2 Glob SerPl Elph-Mcnc 10/26/2017 0.8  0.4 - 1.0 g/dL Corrected  . B-Globulin SerPl Elph-Mcnc 10/26/2017 1.7* 0.7 - 1.3 g/dL Corrected  . Gamma Glob SerPl Elph-Mcnc 10/26/2017 0.8  0.4 - 1.8 g/dL Corrected  . M Protein SerPl Elph-Mcnc 10/26/2017 0.9* Not Observed g/dL Corrected  . Globulin, Total 10/26/2017 3.5  2.2 - 3.9 g/dL Corrected  . Albumin/Glob SerPl 10/26/2017 1.0  0.7 - 1.7 Corrected  . IFE 1 10/26/2017 Comment   Corrected   Comment: (NOTE) Immunofixation shows IgA monoclonal protein with lambda light chain specificity.   . Please Note 10/26/2017 Comment   Corrected   Comment: (NOTE) Protein electrophoresis scan will follow via computer, mail, or courier delivery. Performed At: [facility] [place], [place]  [id number] Rush Farmer MD [id number]   . Sodium 10/26/2017 139  135 - 145 mmol/L Final  . Potassium 10/26/2017 4.4  3.5 - 5.1 mmol/L Final  . Chloride 10/26/2017 106  98 - 111 mmol/L Final  . CO2 10/26/2017 26  22 - 32 mmol/L Final  . Glucose, Bld 10/26/2017 148* 70 - 99 mg/dL Final  . BUN 10/26/2017 36* 8 - 23 mg/dL Final  . Creatinine, Ser 10/26/2017 4.09* 0.44 - 1.00 mg/dL Final  . Calcium 10/26/2017 8.8* 8.9 - 10.3 mg/dL Final  . Total Protein 10/26/2017 7.2  6.5 - 8.1 g/dL Final  . Albumin 10/26/2017 3.2* 3.5 - 5.0 g/dL Final  . AST 10/26/2017 17  15 - 41 U/L Final  . ALT 10/26/2017 9  0 - 44 U/L Final  . Alkaline Phosphatase 10/26/2017 61  38 - 126 U/L Final  . Total Bilirubin 10/26/2017 0.3  0.3 - 1.2 mg/dL Final  . GFR calc non Af Amer 10/26/2017 10* >60 mL/min Final  . GFR calc Af Amer 10/26/2017 11* >60 mL/min Final   Comment: (NOTE) The eGFR has been calculated using the CKD EPI equation. This calculation has not been validated in all clinical situations. eGFR's persistently <60 mL/min signify possible Chronic Kidney Disease.   Georgiann Hahn gap 10/26/2017 7  5 - 15 Final   Performed at [facility], [address], [place], [place] [address]  . WBC 10/26/2017 6.3  4.0 - 10.5 K/uL Final  . RBC 10/26/2017 2.96* 3.87 - 5.11 MIL/uL Final  . Hemoglobin 10/26/2017 9.0* 12.0 - 15.0 g/dL Final  . HCT 10/26/2017 28.0* 36.0 - 46.0 % Final  . MCV 10/26/2017 94.6  78.0 - 100.0 fL Final  . MCH 10/26/2017 30.4  26.0 - 34.0 pg Final  . MCHC 10/26/2017 32.1  30.0 - 36.0 g/dL Final  . RDW 10/26/2017 15.6* 11.5 - 15.5 % Final  . Platelets 10/26/2017 232  150 - 400 K/uL Final  . Neutrophils Relative % 10/26/2017 76  % Final  . Neutro Abs 10/26/2017 4.8  1.7 - 7.7 K/uL Final  . Lymphocytes Relative 10/26/2017 16  % Final  . Lymphs Abs 10/26/2017 1.0  0.7 - 4.0 K/uL Final  . Monocytes Relative 10/26/2017 5  % Final  . Monocytes Absolute 10/26/2017 0.3  0.1 - 1.0 K/uL Final  .  Eosinophils Relative 10/26/2017 3  % Final  . Eosinophils Absolute 10/26/2017 0.2  0.0 - 0.7 K/uL Final  . Basophils Relative 10/26/2017 0  % Final  . Basophils Absolute 10/26/2017 0.0  0.0 - 0.1 K/uL Final   Performed at [facility], [address] [place], [place] [address]     Pathology Orders Placed This Encounter  Procedures  . CBC with Differential/Platelet    Standing Status:   Future    Standing Expiration Date:   11/04/2019  . Comprehensive metabolic panel    Standing Status:   Future    Standing Expiration Date:   11/04/2019  . Lactate dehydrogenase    Standing Status:   Future  Standing Expiration Date:   11/04/2019  . Protein electrophoresis, serum    Standing Status:   Future    Standing Expiration Date:   11/04/2019  . Ferritin    Standing Status:   Future    Standing Expiration Date:   11/04/2019  . IgG, IgA, IgM    Standing Status:   Future    Standing Expiration Date:   11/04/2019  . Beta 2 microglobulin, serum    Standing Status:   Future    Standing Expiration Date:   11/04/2019  . Kappa/lambda light chains    Standing Status:   Future    Standing Expiration Date:   11/04/2019       Zoila Shutter MD

## 2017-11-04 ENCOUNTER — Other Ambulatory Visit: Payer: Self-pay

## 2017-11-04 ENCOUNTER — Encounter: Payer: Self-pay | Admitting: *Deleted

## 2017-11-04 ENCOUNTER — Ambulatory Visit: Payer: Medicare Other | Admitting: Vascular Surgery

## 2017-11-04 ENCOUNTER — Encounter: Payer: Self-pay | Admitting: Vascular Surgery

## 2017-11-04 ENCOUNTER — Ambulatory Visit (INDEPENDENT_AMBULATORY_CARE_PROVIDER_SITE_OTHER)
Admission: RE | Admit: 2017-11-04 | Discharge: 2017-11-04 | Disposition: A | Discharge status: Home / Self Care | Payer: Medicare Other | Source: Ambulatory Visit | Attending: Vascular Surgery | Admitting: Vascular Surgery

## 2017-11-04 ENCOUNTER — Ambulatory Visit (HOSPITAL_COMMUNITY)
Admission: RE | Admit: 2017-11-04 | Discharge: 2017-11-04 | Disposition: A | Discharge status: Home / Self Care | Payer: Medicare Other | Source: Ambulatory Visit | Attending: Vascular Surgery | Admitting: Vascular Surgery

## 2017-11-04 VITALS — BP 155/82 | HR 64 | Temp 98.2°F | Resp 20 | Ht 63.0 in | Wt 192.0 lb

## 2017-11-04 DIAGNOSIS — N184 Chronic kidney disease, stage 4 (severe): Secondary | ICD-10-CM | POA: Diagnosis not present

## 2017-11-04 NOTE — H&P (View-Only) (Signed)
Referring Physician: Lowanda Foster  Patient name: Carly Wood MRN: [id number] DOB: 1935-05-02 Sex: female  REASON FOR CONSULT: Dialysis access  HPI: Carly Wood is a 82 y.o. female for evaluation and placement of a hemodialysis access.  She is not currently on hemodialysis.  She is CKD4.  She is right-handed.  She has had a previous mastectomy with right axillary node dissection.  She has a Port-A-Cath on the left side.  The Port-A-Cath has not been used for chemotherapy infusion for greater than a year.  It is intermittently used for blood draws.  She has no significant swelling in either upper extremity.  Other medical problems include long-term diabetes and hypertension both of which are currently stable.  Past Medical History:  Diagnosis Date  . Anemia   . Anxiety   . Chronic kidney disease   . Chronic renal disease, stage 4, severely decreased glomerular filtration rate (GFR) between 15-29 mL/min/1.73 square meter (HCC) 08/22/2015  . Depression   . Diabetes mellitus without complication ([place])   . Hypertension   . Stage 1 infiltrating ductal carcinoma of right female breast ([place]) 08/21/2015   ER+ PR+ HER 2 neu + (3+) T1cN0    Past Surgical History:  Procedure Laterality Date  . BIOPSY  08/07/2016   Procedure: BIOPSY;  Surgeon: Daneil Dolin, MD;  [facility];  Service: Endoscopy;;  gastric ulcer biopsy  . ESOPHAGOGASTRODUODENOSCOPY N/A 08/07/2016   Procedure: ESOPHAGOGASTRODUODENOSCOPY (EGD);  Surgeon: Daneil Dolin, MD;  [facility];  Service: Endoscopy;  Laterality: N/A;  1215-rescheduled to 6/1 @ 2:30pm per Tretha Sciara  . ESOPHAGOGASTRODUODENOSCOPY N/A 11/27/2016   Procedure: ESOPHAGOGASTRODUODENOSCOPY (EGD);  Surgeon: Daneil Dolin, MD;  [facility];  Service: Endoscopy;  Laterality: N/A;  8:15am  . FRACTURE SURGERY Right    ankle  . MALONEY DILATION N/A 08/07/2016   Procedure: Venia Minks DILATION;  Surgeon: Daneil Dolin, MD;  [facility];  Service: Endoscopy;  Laterality: N/A;  . MASTECTOMY, PARTIAL    . RETINAL DETACHMENT SURGERY Right     Family History  Problem Relation Age of Onset  . Multiple myeloma Sister   . Brain cancer Sister   . Dementia Mother        died at 64  . Stroke Mother   . Heart failure Mother   . Diabetes Mother   . Heart disease Father   . Prostate cancer Brother   . Colon cancer Neg Hx     SOCIAL HISTORY: Social History   Socioeconomic History  . Marital status: Single    Spouse name: Not on file  . Number of children: Not on file  . Years of education: Not on file  . Highest education level: Not on file  Occupational History  . Not on file  Social Needs  . Financial resource strain: Not on file  . Food insecurity:    Worry: Not on file    Inability: Not on file  . Transportation needs:    Medical: Not on file    Non-medical: Not on file  Tobacco Use  . Smoking status: Never Smoker  . Smokeless tobacco: Never Used  Substance and Sexual Activity  . Alcohol use: No    Alcohol/week: 0.0 standard drinks  . Drug use: No  . Sexual activity: Not on file  Lifestyle  . Physical activity:    Days per week: Not on file    Minutes per session: Not on file  .  Stress: Not on file  Relationships  . Social connections:    Talks on phone: Not on file    Gets together: Not on file    Attends religious service: Not on file    Active member of club or organization: Not on file    Attends meetings of clubs or organizations: Not on file    Relationship status: Not on file  . Intimate partner violence:    Fear of current or ex partner: Not on file    Emotionally abused: Not on file    Physically abused: Not on file    Forced sexual activity: Not on file  Other Topics Concern  . Not on file  Social History Narrative  . Not on file    Allergies  Allergen Reactions  . Penicillins Other (See Comments)    Unsure of reaction  . Ace Inhibitors Cough and Other (See  Comments)    Tongue swell  . Lisinopril Other (See Comments)    Tongue swell    Current Outpatient Medications  Medication Sig Dispense Refill  . acetaminophen (TYLENOL) 500 MG tablet Take 500 mg by mouth every 6 (six) hours as needed.     . Alcohol Swabs (ALCOHOL PREP) 70 % PADS See admin instructions.  12  . amLODipine (NORVASC) 10 MG tablet Take 10 mg by mouth daily.  4  . amLODipine (NORVASC) 5 MG tablet Take 5 mg by mouth daily.     Marland Kitchen anastrozole (ARIMIDEX) 1 MG tablet Take 1 tablet (1 mg total) by mouth daily. 90 tablet 3  . anti-nausea (EMETROL) solution Take by mouth.    Marland Kitchen AQUALANCE LANCETS 30G MISC USE TO test twice daily  99  . aspirin EC 81 MG tablet Take 81 mg by mouth daily.    . calcitRIOL (ROCALTROL) 0.25 MCG capsule Take 0.25 mcg by mouth daily.    . calcium-vitamin D (OSCAL 500/200 D-3) 500-200 MG-UNIT tablet Take by mouth.    . cloNIDine (CATAPRES) 0.3 MG tablet Take 0.3 mg by mouth 2 (two) times daily.     Marland Kitchen docusate sodium (COLACE) 100 MG capsule Take by mouth.    . esomeprazole (NEXIUM) 40 MG capsule Take by mouth.    Marland Kitchen glimepiride (AMARYL) 4 MG tablet Take by mouth.    . Insulin Glargine (TOUJEO MAX SOLOSTAR) 300 UNIT/ML SOPN Inject 5 Units into the skin every evening.    . iron polysaccharides (NIFEREX) 150 MG capsule Take 150 mg by mouth daily.    Elmore Guise Devices (ADJUSTABLE LANCING DEVICE) MISC TO check blood glucose  1  . lidocaine-prilocaine (EMLA) cream Apply 1 application topically as needed. Apply to port site prior when needed for chemo, labs or port flushes. 30 g 2  . loperamide (LOPERAMIDE A-D) 2 MG tablet Days 1-14 take 2 tabs three times a day Days 15-56 take 2 tabs two times a day Days 57-365 take 2 tabs as needed (max dose 16 mg/day) 140 tablet 3  . LORazepam (ATIVAN) 0.5 MG tablet Take by mouth.    . nebivolol (BYSTOLIC) 10 MG tablet Take 10 mg by mouth daily.     . Neratinib Maleate (NERLYNX) 40 MG tablet Take 6 tablets (240 mg total) by mouth  daily. Take with food. 180 tablet 0  . ondansetron (ZOFRAN) 8 MG tablet Take 1 tablet (8 mg total) by mouth every 8 (eight) hours as needed for nausea or vomiting. 30 tablet 2  . ONGLYZA 5 MG TABS tablet Take 5 mg  by mouth daily.  11  . PARoxetine (PAXIL) 20 MG tablet Take 20 mg by mouth daily.     . pioglitazone (ACTOS) 30 MG tablet Take 15 mg by mouth daily.     . pravastatin (PRAVACHOL) 20 MG tablet Take 20 mg by mouth daily. Reported on 08/30/2015    . prochlorperazine (COMPAZINE) 10 MG tablet Take 1 tablet (10 mg total) by mouth every 6 (six) hours as needed for nausea or vomiting. 30 tablet 2  . traZODone (DESYREL) 100 MG tablet Take 100 mg by mouth at bedtime.  2   No current facility-administered medications for this visit.     ROS:   General:  No weight loss, Fever, chills  HEENT: No recent headaches, no nasal bleeding, no visual changes, no sore throat  Neurologic: No dizziness, blackouts, seizures. No recent symptoms of stroke or mini- stroke. No recent episodes of slurred speech, or temporary blindness.  Cardiac: No recent episodes of chest pain/pressure, no shortness of breath at rest.  No shortness of breath with exertion.  Denies history of atrial fibrillation or irregular heartbeat  Vascular: No history of rest pain in feet.  No history of claudication.  No history of non-healing ulcer, No history of DVT   Pulmonary: No home oxygen, no productive cough, no hemoptysis,  No asthma or wheezing  Musculoskeletal:  '[ ]'  Arthritis, '[ ]'  Low back pain,  '[ ]'  Joint pain  Hematologic:No history of hypercoagulable state.  No history of easy bleeding.  No history of anemia  Gastrointestinal: No hematochezia or melena,  No gastroesophageal reflux, no trouble swallowing  Urinary: '[X]'  chronic Kidney disease, '[ ]'  on HD - '[ ]'  MWF or '[ ]'  TTHS, '[ ]'  Burning with urination, '[ ]'  Frequent urination, '[ ]'  Difficulty urinating;   Skin: No rashes  Psychological: No history of anxiety,  No  history of depression   Physical Examination  Vitals:   11/04/17 1547  BP: (!) 155/82  Pulse: 64  Resp: 20  Temp: 98.2 F (36.8 C)  TempSrc: Oral  SpO2: 100%  Weight: 192 lb (87.1 kg)  Height: '5\' 3"'  (1.6 m)    Body mass index is 34.01 kg/m.  General:  Alert and oriented, no acute distress HEENT: Normal Neck: No bruit or JVD Pulmonary: Clear to auscultation bilaterally, chest wall vein collaterals left side Port-A-Cath Cardiac: Regular Rate and Rhythm without murmur Abdomen: Soft, non-tender, non-distended, no mass, obese Skin: No rash Extremity Pulses:  2+ radial, brachial pulses bilaterally Musculoskeletal: No deformity or edema  Neurologic: Upper and lower extremity motor 5/5 and symmetric  DATA:  Patient had an upper extremity arterial duplex today which showed no significant arterial stenosis and normal arterial anatomy.  She also had a vein mapping ultrasound which showed fairly small veins bilaterally.  The cephalic vein was borderline at 2-1/2 to 3 mm in the upper arm.  The basilic vein was also small.  ASSESSMENT: Patient needs long-term hemodialysis access.  She is right-handed so we will try to go to the left arm first.  I would prefer to remove her Port-A-Cath so that she does not have a central vein stenosis in this long-term.  We will remove her Port-A-Cath at the time of placement of the left arm AV fistula versus graft.  This is scheduled for November 22, 2017.  Benefits possible complications including but not limited to bleeding fistula thrombosis non-maturation ischemic steal infection need for other procedures were discussed the patient and her family today.  They understand and agree to proceed.   PLAN: See above.   Ruta Hinds, MD [facility] Office: [phone] Pager: [phone]

## 2017-11-04 NOTE — Progress Notes (Signed)
Referring Physician: Lowanda Foster  Patient name: Carly Wood MRN: [id number] DOB: [birth year] Sex: female  REASON FOR CONSULT: Dialysis access  HPI: Carly Wood is a 82 y.o. female for evaluation and placement of a hemodialysis access.  She is not currently on hemodialysis.  She is CKD4.  She is right-handed.  She has had a previous mastectomy with right axillary node dissection.  She has a Port-A-Cath on the left side.  The Port-A-Cath has not been used for chemotherapy infusion for greater than a year.  It is intermittently used for blood draws.  She has no significant swelling in either upper extremity.  Other medical problems include long-term diabetes and hypertension both of which are currently stable.  Past Medical History:  Diagnosis Date  . Anemia   . Anxiety   . Chronic kidney disease   . Chronic renal disease, stage 4, severely decreased glomerular filtration rate (GFR) between 15-29 mL/min/1.73 square meter (HCC) 08/22/2015  . Depression   . Diabetes mellitus without complication ([place])   . Hypertension   . Stage 1 infiltrating ductal carcinoma of right female breast ([place]) 08/21/2015   ER+ PR+ HER 2 neu + (3+) T1cN0    Past Surgical History:  Procedure Laterality Date  . BIOPSY  08/07/2016   Procedure: BIOPSY;  Surgeon: Daneil Dolin, MD;  [facility];  Service: Endoscopy;;  gastric ulcer biopsy  . ESOPHAGOGASTRODUODENOSCOPY N/A 08/07/2016   Procedure: ESOPHAGOGASTRODUODENOSCOPY (EGD);  Surgeon: Daneil Dolin, MD;  [facility];  Service: Endoscopy;  Laterality: N/A;  1215-rescheduled to 6/1 @ 2:30pm per Tretha Sciara  . ESOPHAGOGASTRODUODENOSCOPY N/A 11/27/2016   Procedure: ESOPHAGOGASTRODUODENOSCOPY (EGD);  Surgeon: Daneil Dolin, MD;  [facility];  Service: Endoscopy;  Laterality: N/A;  8:15am  . FRACTURE SURGERY Right    ankle  . MALONEY DILATION N/A 08/07/2016   Procedure: Venia Minks DILATION;  Surgeon: Daneil Dolin, MD;  [facility];  Service: Endoscopy;  Laterality: N/A;  . MASTECTOMY, PARTIAL    . RETINAL DETACHMENT SURGERY Right     Family History  Problem Relation Age of Onset  . Multiple myeloma Sister   . Brain cancer Sister   . Dementia Mother        died at 59  . Stroke Mother   . Heart failure Mother   . Diabetes Mother   . Heart disease Father   . Prostate cancer Brother   . Colon cancer Neg Hx     SOCIAL HISTORY: Social History   Socioeconomic History  . Marital status: Single    Spouse name: Not on file  . Number of children: Not on file  . Years of education: Not on file  . Highest education level: Not on file  Occupational History  . Not on file  Social Needs  . Financial resource strain: Not on file  . Food insecurity:    Worry: Not on file    Inability: Not on file  . Transportation needs:    Medical: Not on file    Non-medical: Not on file  Tobacco Use  . Smoking status: Never Smoker  . Smokeless tobacco: Never Used  Substance and Sexual Activity  . Alcohol use: No    Alcohol/week: 0.0 standard drinks  . Drug use: No  . Sexual activity: Not on file  Lifestyle  . Physical activity:    Days per week: Not on file    Minutes per session: Not on file  .  Stress: Not on file  Relationships  . Social connections:    Talks on phone: Not on file    Gets together: Not on file    Attends religious service: Not on file    Active member of club or organization: Not on file    Attends meetings of clubs or organizations: Not on file    Relationship status: Not on file  . Intimate partner violence:    Fear of current or ex partner: Not on file    Emotionally abused: Not on file    Physically abused: Not on file    Forced sexual activity: Not on file  Other Topics Concern  . Not on file  Social History Narrative  . Not on file    Allergies  Allergen Reactions  . Penicillins Other (See Comments)    Unsure of reaction  . Ace Inhibitors Cough and Other (See  Comments)    Tongue swell  . Lisinopril Other (See Comments)    Tongue swell    Current Outpatient Medications  Medication Sig Dispense Refill  . acetaminophen (TYLENOL) 500 MG tablet Take 500 mg by mouth every 6 (six) hours as needed.     . Alcohol Swabs (ALCOHOL PREP) 70 % PADS See admin instructions.  12  . amLODipine (NORVASC) 10 MG tablet Take 10 mg by mouth daily.  4  . amLODipine (NORVASC) 5 MG tablet Take 5 mg by mouth daily.     Marland Kitchen anastrozole (ARIMIDEX) 1 MG tablet Take 1 tablet (1 mg total) by mouth daily. 90 tablet 3  . anti-nausea (EMETROL) solution Take by mouth.    Marland Kitchen AQUALANCE LANCETS 30G MISC USE TO test twice daily  99  . aspirin EC 81 MG tablet Take 81 mg by mouth daily.    . calcitRIOL (ROCALTROL) 0.25 MCG capsule Take 0.25 mcg by mouth daily.    . calcium-vitamin D (OSCAL 500/200 D-3) 500-200 MG-UNIT tablet Take by mouth.    . cloNIDine (CATAPRES) 0.3 MG tablet Take 0.3 mg by mouth 2 (two) times daily.     Marland Kitchen docusate sodium (COLACE) 100 MG capsule Take by mouth.    . esomeprazole (NEXIUM) 40 MG capsule Take by mouth.    Marland Kitchen glimepiride (AMARYL) 4 MG tablet Take by mouth.    . Insulin Glargine (TOUJEO MAX SOLOSTAR) 300 UNIT/ML SOPN Inject 5 Units into the skin every evening.    . iron polysaccharides (NIFEREX) 150 MG capsule Take 150 mg by mouth daily.    Elmore Guise Devices (ADJUSTABLE LANCING DEVICE) MISC TO check blood glucose  1  . lidocaine-prilocaine (EMLA) cream Apply 1 application topically as needed. Apply to port site prior when needed for chemo, labs or port flushes. 30 g 2  . loperamide (LOPERAMIDE A-D) 2 MG tablet Days 1-14 take 2 tabs three times a day Days 15-56 take 2 tabs two times a day Days 57-365 take 2 tabs as needed (max dose 16 mg/day) 140 tablet 3  . LORazepam (ATIVAN) 0.5 MG tablet Take by mouth.    . nebivolol (BYSTOLIC) 10 MG tablet Take 10 mg by mouth daily.     . Neratinib Maleate (NERLYNX) 40 MG tablet Take 6 tablets (240 mg total) by mouth  daily. Take with food. 180 tablet 0  . ondansetron (ZOFRAN) 8 MG tablet Take 1 tablet (8 mg total) by mouth every 8 (eight) hours as needed for nausea or vomiting. 30 tablet 2  . ONGLYZA 5 MG TABS tablet Take 5 mg  by mouth daily.  11  . PARoxetine (PAXIL) 20 MG tablet Take 20 mg by mouth daily.     . pioglitazone (ACTOS) 30 MG tablet Take 15 mg by mouth daily.     . pravastatin (PRAVACHOL) 20 MG tablet Take 20 mg by mouth daily. Reported on 08/30/2015    . prochlorperazine (COMPAZINE) 10 MG tablet Take 1 tablet (10 mg total) by mouth every 6 (six) hours as needed for nausea or vomiting. 30 tablet 2  . traZODone (DESYREL) 100 MG tablet Take 100 mg by mouth at bedtime.  2   No current facility-administered medications for this visit.     ROS:   General:  No weight loss, Fever, chills  HEENT: No recent headaches, no nasal bleeding, no visual changes, no sore throat  Neurologic: No dizziness, blackouts, seizures. No recent symptoms of stroke or mini- stroke. No recent episodes of slurred speech, or temporary blindness.  Cardiac: No recent episodes of chest pain/pressure, no shortness of breath at rest.  No shortness of breath with exertion.  Denies history of atrial fibrillation or irregular heartbeat  Vascular: No history of rest pain in feet.  No history of claudication.  No history of non-healing ulcer, No history of DVT   Pulmonary: No home oxygen, no productive cough, no hemoptysis,  No asthma or wheezing  Musculoskeletal:  '[ ]'  Arthritis, '[ ]'  Low back pain,  '[ ]'  Joint pain  Hematologic:No history of hypercoagulable state.  No history of easy bleeding.  No history of anemia  Gastrointestinal: No hematochezia or melena,  No gastroesophageal reflux, no trouble swallowing  Urinary: '[X]'  chronic Kidney disease, '[ ]'  on HD - '[ ]'  MWF or '[ ]'  TTHS, '[ ]'  Burning with urination, '[ ]'  Frequent urination, '[ ]'  Difficulty urinating;   Skin: No rashes  Psychological: No history of anxiety,  No  history of depression   Physical Examination  Vitals:   11/04/17 1547  BP: (!) 155/82  Pulse: 64  Resp: 20  Temp: 98.2 F (36.8 C)  TempSrc: Oral  SpO2: 100%  Weight: 192 lb (87.1 kg)  Height: '5\' 3"'  (1.6 m)    Body mass index is 34.01 kg/m.  General:  Alert and oriented, no acute distress HEENT: Normal Neck: No bruit or JVD Pulmonary: Clear to auscultation bilaterally, chest wall vein collaterals left side Port-A-Cath Cardiac: Regular Rate and Rhythm without murmur Abdomen: Soft, non-tender, non-distended, no mass, obese Skin: No rash Extremity Pulses:  2+ radial, brachial pulses bilaterally Musculoskeletal: No deformity or edema  Neurologic: Upper and lower extremity motor 5/5 and symmetric  DATA:  Patient had an upper extremity arterial duplex today which showed no significant arterial stenosis and normal arterial anatomy.  She also had a vein mapping ultrasound which showed fairly small veins bilaterally.  The cephalic vein was borderline at 2-1/2 to 3 mm in the upper arm.  The basilic vein was also small.  ASSESSMENT: Patient needs long-term hemodialysis access.  She is right-handed so we will try to go to the left arm first.  I would prefer to remove her Port-A-Cath so that she does not have a central vein stenosis in this long-term.  We will remove her Port-A-Cath at the time of placement of the left arm AV fistula versus graft.  This is scheduled for November 22, 2017.  Benefits possible complications including but not limited to bleeding fistula thrombosis non-maturation ischemic steal infection need for other procedures were discussed the patient and her family today.  They understand and agree to proceed.   PLAN: See above.   Ruta Hinds, MD [facility] Office: [phone] Pager: [phone]

## 2017-11-05 ENCOUNTER — Encounter: Payer: Self-pay | Admitting: Nephrology

## 2017-11-05 ENCOUNTER — Other Ambulatory Visit: Payer: Self-pay | Admitting: *Deleted

## 2017-11-19 ENCOUNTER — Other Ambulatory Visit: Payer: Self-pay

## 2017-11-19 ENCOUNTER — Encounter (HOSPITAL_COMMUNITY): Payer: Self-pay | Admitting: *Deleted

## 2017-11-19 NOTE — Progress Notes (Signed)
Carly Wood's sister, Carly Wood is patient's care giver.  Carly Wood reports that fasting CBG's run 100- 125. I instructed to take  2 units Toujeu Sunday and to hold Onglzo and Actos the moring of surgery.  I instructed patient to check CBG after awaking and every 2 hours until arrival  to the hospital.  I Instructed patient if CBG is less than 70 to  drink 1/2 cup of a clear juice. Recheck CBG in 15 minutes then call pre- op desk at [phone] for further instructions.

## 2017-11-22 ENCOUNTER — Encounter (HOSPITAL_COMMUNITY): Payer: Self-pay

## 2017-11-22 ENCOUNTER — Ambulatory Visit (HOSPITAL_COMMUNITY): Payer: Medicare Other | Admitting: Anesthesiology

## 2017-11-22 ENCOUNTER — Ambulatory Visit (HOSPITAL_COMMUNITY)
Admission: RE | Admit: 2017-11-22 | Discharge: 2017-11-22 | Disposition: A | Discharge status: Home / Self Care | Payer: Medicare Other | Source: Ambulatory Visit | Attending: Vascular Surgery | Admitting: Vascular Surgery

## 2017-11-22 ENCOUNTER — Other Ambulatory Visit: Payer: Self-pay

## 2017-11-22 ENCOUNTER — Ambulatory Visit (HOSPITAL_COMMUNITY): Payer: Medicare Other

## 2017-11-22 ENCOUNTER — Encounter (HOSPITAL_COMMUNITY)
Admission: RE | Disposition: A | Discharge status: Home / Self Care | Payer: Self-pay | Source: Ambulatory Visit | Attending: Vascular Surgery

## 2017-11-22 ENCOUNTER — Telehealth: Payer: Self-pay | Admitting: Vascular Surgery

## 2017-11-22 DIAGNOSIS — N186 End stage renal disease: Secondary | ICD-10-CM | POA: Diagnosis present

## 2017-11-22 DIAGNOSIS — Z794 Long term (current) use of insulin: Secondary | ICD-10-CM | POA: Insufficient documentation

## 2017-11-22 DIAGNOSIS — I120 Hypertensive chronic kidney disease with stage 5 chronic kidney disease or end stage renal disease: Secondary | ICD-10-CM | POA: Insufficient documentation

## 2017-11-22 DIAGNOSIS — Z9889 Other specified postprocedural states: Secondary | ICD-10-CM | POA: Diagnosis not present

## 2017-11-22 DIAGNOSIS — Z820 Family history of epilepsy and other diseases of the nervous system: Secondary | ICD-10-CM | POA: Insufficient documentation

## 2017-11-22 DIAGNOSIS — Z807 Family history of other malignant neoplasms of lymphoid, hematopoietic and related tissues: Secondary | ICD-10-CM | POA: Diagnosis not present

## 2017-11-22 DIAGNOSIS — Z888 Allergy status to other drugs, medicaments and biological substances status: Secondary | ICD-10-CM | POA: Insufficient documentation

## 2017-11-22 DIAGNOSIS — Z7982 Long term (current) use of aspirin: Secondary | ICD-10-CM | POA: Diagnosis not present

## 2017-11-22 DIAGNOSIS — Z823 Family history of stroke: Secondary | ICD-10-CM | POA: Diagnosis not present

## 2017-11-22 DIAGNOSIS — Z79899 Other long term (current) drug therapy: Secondary | ICD-10-CM | POA: Diagnosis not present

## 2017-11-22 DIAGNOSIS — F419 Anxiety disorder, unspecified: Secondary | ICD-10-CM | POA: Diagnosis not present

## 2017-11-22 DIAGNOSIS — Z9011 Acquired absence of right breast and nipple: Secondary | ICD-10-CM | POA: Diagnosis not present

## 2017-11-22 DIAGNOSIS — Z853 Personal history of malignant neoplasm of breast: Secondary | ICD-10-CM | POA: Diagnosis not present

## 2017-11-22 DIAGNOSIS — Z880 Allergy status to penicillin: Secondary | ICD-10-CM | POA: Insufficient documentation

## 2017-11-22 DIAGNOSIS — Z8249 Family history of ischemic heart disease and other diseases of the circulatory system: Secondary | ICD-10-CM | POA: Diagnosis not present

## 2017-11-22 DIAGNOSIS — F329 Major depressive disorder, single episode, unspecified: Secondary | ICD-10-CM | POA: Diagnosis not present

## 2017-11-22 DIAGNOSIS — Z808 Family history of malignant neoplasm of other organs or systems: Secondary | ICD-10-CM | POA: Insufficient documentation

## 2017-11-22 DIAGNOSIS — Z8042 Family history of malignant neoplasm of prostate: Secondary | ICD-10-CM | POA: Insufficient documentation

## 2017-11-22 DIAGNOSIS — E1122 Type 2 diabetes mellitus with diabetic chronic kidney disease: Secondary | ICD-10-CM | POA: Diagnosis not present

## 2017-11-22 DIAGNOSIS — N185 Chronic kidney disease, stage 5: Secondary | ICD-10-CM

## 2017-11-22 HISTORY — DX: Unspecified dementia, unspecified severity, without behavioral disturbance, psychotic disturbance, mood disturbance, and anxiety: F03.90

## 2017-11-22 HISTORY — DX: Other complications of anesthesia, initial encounter: T88.59XA

## 2017-11-22 HISTORY — DX: Gastro-esophageal reflux disease without esophagitis: K21.9

## 2017-11-22 HISTORY — DX: Unspecified glaucoma: H40.9

## 2017-11-22 HISTORY — DX: Dyspnea, unspecified: R06.00

## 2017-11-22 HISTORY — PX: PORT-A-CATH REMOVAL: SHX5289

## 2017-11-22 HISTORY — PX: AV FISTULA PLACEMENT: SHX1204

## 2017-11-22 HISTORY — DX: Adverse effect of unspecified anesthetic, initial encounter: T41.45XA

## 2017-11-22 LAB — POCT I-STAT 4, (NA,K, GLUC, HGB,HCT)
GLUCOSE: 139 mg/dL — AB (ref 70–99)
HEMATOCRIT: 21 % — AB (ref 36.0–46.0)
HEMOGLOBIN: 7.1 g/dL — AB (ref 12.0–15.0)
Potassium: 4.9 mmol/L (ref 3.5–5.1)
SODIUM: 142 mmol/L (ref 135–145)

## 2017-11-22 LAB — GLUCOSE, CAPILLARY
Glucose-Capillary: 114 mg/dL — ABNORMAL HIGH (ref 70–99)
Glucose-Capillary: 111 mg/dL — ABNORMAL HIGH (ref 70–99)

## 2017-11-22 SURGERY — ARTERIOVENOUS (AV) FISTULA CREATION
Anesthesia: Monitor Anesthesia Care | Site: Chest | Laterality: Left

## 2017-11-22 MED ORDER — HEPARIN SODIUM (PORCINE) 1000 UNIT/ML IJ SOLN
INTRAMUSCULAR | Status: DC | PRN
  Administered 2017-11-22: 3000 [IU] via INTRAVENOUS

## 2017-11-22 MED ORDER — LIDOCAINE HCL (PF) 1 % IJ SOLN
INTRAMUSCULAR | Status: DC | PRN
  Administered 2017-11-22: 16 mL

## 2017-11-22 MED ORDER — LABETALOL HCL 5 MG/ML IV SOLN
INTRAVENOUS | Status: AC
  Filled 2017-11-22: qty 4

## 2017-11-22 MED ORDER — LABETALOL HCL 5 MG/ML IV SOLN
5.0000 mg | Freq: Once | INTRAVENOUS | Status: AC
  Administered 2017-11-22: 5 mg via INTRAVENOUS

## 2017-11-22 MED ORDER — VANCOMYCIN HCL 10 G IV SOLR
1500.0000 mg | INTRAVENOUS | Status: DC
  Filled 2017-11-22: qty 1500

## 2017-11-22 MED ORDER — LIDOCAINE HCL (PF) 1 % IJ SOLN
INTRAMUSCULAR | Status: AC
  Filled 2017-11-22: qty 30

## 2017-11-22 MED ORDER — SODIUM CHLORIDE 0.9 % IV SOLN
INTRAVENOUS | Status: AC
  Filled 2017-11-22: qty 1.2

## 2017-11-22 MED ORDER — PHENYLEPHRINE 40 MCG/ML (10ML) SYRINGE FOR IV PUSH (FOR BLOOD PRESSURE SUPPORT)
PREFILLED_SYRINGE | INTRAVENOUS | Status: DC | PRN
  Administered 2017-11-22 (×2): 80 ug via INTRAVENOUS

## 2017-11-22 MED ORDER — SODIUM CHLORIDE 0.9 % IV SOLN
INTRAVENOUS | Status: DC | PRN
  Administered 2017-11-22: 14:00:00

## 2017-11-22 MED ORDER — ESMOLOL HCL 100 MG/10ML IV SOLN
INTRAVENOUS | Status: DC | PRN
  Administered 2017-11-22: 30 mg via INTRAVENOUS

## 2017-11-22 MED ORDER — PROPOFOL 500 MG/50ML IV EMUL
INTRAVENOUS | Status: DC | PRN
  Administered 2017-11-22: 75 ug/kg/min via INTRAVENOUS

## 2017-11-22 MED ORDER — PROMETHAZINE HCL 25 MG/ML IJ SOLN: 6.2500 mg | INTRAMUSCULAR | Status: DC | PRN

## 2017-11-22 MED ORDER — CHLORHEXIDINE GLUCONATE 4 % EX LIQD: 60.0000 mL | Freq: Once | CUTANEOUS | Status: DC

## 2017-11-22 MED ORDER — PROPOFOL 10 MG/ML IV BOLUS
INTRAVENOUS | Status: DC | PRN
  Administered 2017-11-22: 10 mg via INTRAVENOUS
  Administered 2017-11-22: 20 mg via INTRAVENOUS

## 2017-11-22 MED ORDER — DEXMEDETOMIDINE HCL IN NACL 200 MCG/50ML IV SOLN
INTRAVENOUS | Status: DC | PRN
  Administered 2017-11-22: 4 ug via INTRAVENOUS
  Administered 2017-11-22: 8 ug via INTRAVENOUS
  Administered 2017-11-22 (×2): 4 ug via INTRAVENOUS

## 2017-11-22 MED ORDER — HYDROMORPHONE HCL 1 MG/ML IJ SOLN: 0.2500 mg | INTRAMUSCULAR | Status: DC | PRN

## 2017-11-22 MED ORDER — 0.9 % SODIUM CHLORIDE (POUR BTL) OPTIME
TOPICAL | Status: DC | PRN
  Administered 2017-11-22: 1000 mL

## 2017-11-22 MED ORDER — VANCOMYCIN HCL 1000 MG IV SOLR
INTRAVENOUS | Status: DC | PRN
  Administered 2017-11-22: 1500 mg via INTRAVENOUS

## 2017-11-22 MED ORDER — LIDOCAINE-EPINEPHRINE (PF) 1 %-1:200000 IJ SOLN
INTRAMUSCULAR | Status: AC
  Filled 2017-11-22: qty 30

## 2017-11-22 MED ORDER — HYDROCODONE-ACETAMINOPHEN 5-325 MG PO TABS: 1.0000 | ORAL_TABLET | Freq: Four times a day (QID) | ORAL | 0 refills | Status: DC | PRN

## 2017-11-22 MED ORDER — SODIUM CHLORIDE 0.9 % IV SOLN
INTRAVENOUS | Status: DC
  Administered 2017-11-22: 11:00:00 via INTRAVENOUS

## 2017-11-22 SURGICAL SUPPLY — 41 items
ADH SKN CLS APL DERMABOND .7 (GAUZE/BANDAGES/DRESSINGS) ×2
ADH SKN CLS LQ APL DERMABOND (GAUZE/BANDAGES/DRESSINGS) ×4
ARMBAND PINK RESTRICT EXTREMIT (MISCELLANEOUS) ×6 IMPLANT
CANISTER SUCT 3000ML PPV (MISCELLANEOUS) ×3 IMPLANT
CANNULA VESSEL 3MM 2 BLNT TIP (CANNULA) ×3 IMPLANT
CLIP VESOCCLUDE MED 6/CT (CLIP) ×3 IMPLANT
CLIP VESOCCLUDE SM WIDE 6/CT (CLIP) ×4 IMPLANT
COVER PROBE W GEL 5X96 (DRAPES) IMPLANT
DECANTER SPIKE VIAL GLASS SM (MISCELLANEOUS) ×3 IMPLANT
DERMABOND ADHESIVE PROPEN (GAUZE/BANDAGES/DRESSINGS) ×2
DERMABOND ADVANCED (GAUZE/BANDAGES/DRESSINGS) ×1
DERMABOND ADVANCED .7 DNX12 (GAUZE/BANDAGES/DRESSINGS) ×2 IMPLANT
DERMABOND ADVANCED .7 DNX6 (GAUZE/BANDAGES/DRESSINGS) IMPLANT
DRAIN PENROSE 1/4X12 LTX STRL (WOUND CARE) ×3 IMPLANT
ELECT REM PT RETURN 9FT ADLT (ELECTROSURGICAL) ×3
ELECTRODE REM PT RTRN 9FT ADLT (ELECTROSURGICAL) ×2 IMPLANT
GLOVE BIO SURGEON STRL SZ7.5 (GLOVE) ×3 IMPLANT
GLOVE BIOGEL PI IND STRL 7.0 (GLOVE) IMPLANT
GLOVE BIOGEL PI IND STRL 7.5 (GLOVE) IMPLANT
GLOVE BIOGEL PI INDICATOR 7.0 (GLOVE) ×1
GLOVE BIOGEL PI INDICATOR 7.5 (GLOVE) ×1
GOWN STRL REUS W/ TWL LRG LVL3 (GOWN DISPOSABLE) ×6 IMPLANT
GOWN STRL REUS W/TWL LRG LVL3 (GOWN DISPOSABLE) ×9
KIT BASIN OR (CUSTOM PROCEDURE TRAY) ×3 IMPLANT
KIT TURNOVER KIT B (KITS) ×3 IMPLANT
LOOP VESSEL MINI RED (MISCELLANEOUS) IMPLANT
NS IRRIG 1000ML POUR BTL (IV SOLUTION) ×3 IMPLANT
PACK CV ACCESS (CUSTOM PROCEDURE TRAY) ×3 IMPLANT
PACK UNIVERSAL I (CUSTOM PROCEDURE TRAY) ×1 IMPLANT
PAD ARMBOARD 7.5X6 YLW CONV (MISCELLANEOUS) ×6 IMPLANT
SLEEVE SURGEON STRL (DRAPES) ×1 IMPLANT
SPONGE SURGIFOAM ABS GEL 100 (HEMOSTASIS) IMPLANT
SUT PROLENE 6 0 BV (SUTURE) ×3 IMPLANT
SUT PROLENE 7 0 BV 1 (SUTURE) ×3 IMPLANT
SUT VIC AB 3-0 SH 27 (SUTURE) ×9
SUT VIC AB 3-0 SH 27X BRD (SUTURE) ×2 IMPLANT
SUT VIC AB 4-0 PS2 27 (SUTURE) ×1 IMPLANT
SUT VICRYL 4-0 PS2 18IN ABS (SUTURE) ×3 IMPLANT
TOWEL GREEN STERILE (TOWEL DISPOSABLE) ×3 IMPLANT
UNDERPAD 30X30 (UNDERPADS AND DIAPERS) ×3 IMPLANT
WATER STERILE IRR 1000ML POUR (IV SOLUTION) ×3 IMPLANT

## 2017-11-22 NOTE — Anesthesia Preprocedure Evaluation (Addendum)
Anesthesia Evaluation  Patient identified by MRN, date of birth, ID band Patient awake    Reviewed: Allergy & Precautions, NPO status , Patient's Chart, lab work & pertinent test results  History of Anesthesia Complications (+) Emergence Delirium and history of anesthetic complications  Airway Mallampati: II  TM Distance: >3 FB Neck ROM: Full    Dental no notable dental hx. (+) Dental Advisory Given   Pulmonary neg pulmonary ROS,    Pulmonary exam normal        Cardiovascular hypertension, Pt. on medications Normal cardiovascular exam     Neuro/Psych Anxiety Depression Dementia negative neurological ROS     GI/Hepatic Neg liver ROS, PUD, GERD  ,  Endo/Other  diabetes  Renal/GU CRFRenal disease     Musculoskeletal   Abdominal   Peds  Hematology   Anesthesia Other Findings   Reproductive/Obstetrics                            Anesthesia Physical Anesthesia Plan  ASA: III  Anesthesia Plan: MAC   Post-op Pain Management:    Induction: Intravenous  PONV Risk Score and Plan: 2 and Treatment may vary due to age or medical condition and Ondansetron  Airway Management Planned: Natural Airway and Simple Face Mask  Additional Equipment:   Intra-op Plan:   Post-operative Plan:   Informed Consent: I have reviewed the patients History and Physical, chart, labs and discussed the procedure including the risks, benefits and alternatives for the proposed anesthesia with the patient or authorized representative who has indicated his/her understanding and acceptance.   Dental advisory given  Plan Discussed with: CRNA and Anesthesiologist  Anesthesia Plan Comments:       Anesthesia Quick Evaluation

## 2017-11-22 NOTE — Interval H&P Note (Signed)
History and Physical Interval Note:  11/22/2017 1:30 PM  Carly Wood  has presented today for surgery, with the diagnosis of CHRONIC KIDNEY DISEASE FOR HEMODIALYSIS ACCESS/COMPLETION OF THERAPY USING PORTA-CATH  The various methods of treatment have been discussed with the patient and family. After consideration of risks, benefits and other options for treatment, the patient has consented to  Procedure(s): ARTERIOVENOUS (AV) FISTULA CREATION LEFT ARM (Left) REMOVAL PORT-A-CATH LEFT CHEST (Left) as a surgical intervention .  The patient's history has been reviewed, patient examined, no change in status, stable for surgery.  I have reviewed the patient's chart and labs.  Questions were answered to the patient's satisfaction.     Ruta Hinds

## 2017-11-22 NOTE — Op Note (Signed)
Procedure: #1 removal of left side Port-A-Cath #2 placement of left brachiocephalic AV fistula  Preoperative diagnosis: End-stage renal disease  Postoperative diagnosis: Same  Anesthesia: Local with IV sedation  Assistant: Arlee Muslim, PA-C  Operative findings: #1 intact Port-A-Cath removed   #2 3 mm left cephalic vein  Operative details: After obtaining informed consent, the patient was taken the operating.  The patient was placed in supine position on the operating table.  After adequate sedation patient's entire left upper extremity and chest were prepped and draped in usual sterile fashion.  Local anesthesia was inflated over a left anterior chest wall Port-A-Cath.  Incision was made in this location carried through the subtenons tissues down level of Port-A-Cath.  The Port-A-Cath was dissected free and both sutures removed.  Gentle traction was then made on the Port-A-Cath and the entire Port-A-Cath tip was removed.  Hemostasis was obtained with direct pressure.  Next attention was turned to the left upper extremity.  Ultrasound was used to identify the patient's left cephalic vein.  This is approximate 3 mm in diameter.  Local anesthesia was infiltrated into the left antecubital crease.  Transverse incision was made in this location carried down through the subtenons tissues down the level of the cephalic vein.  There was dense scar tissue around this and around the artery probably from previous needle sticks.  The artery was dissected free circumferentially in the medial portion the incision.  It was about 3 mm in diameter.  Several small side branches were ligated and divided between silk ties.  Several small side branches were ligated and divided between silk ties on the cephalic vein as well.  The patient was given 3000 units of intravenous heparin.  The distal end of the cephalic vein was ligated with a 2-0 silk tie and the vein transected and swung over the level of the artery.  It was  gently distended with heparinized saline and controlled proximally with a fine bulldog clamp.  Vesseloops were used to control the artery proximally distally.  Longitudinal opening was made in the brachial artery and the vein was sewn end of vein to side of artery using a running 7-0 Prolene suture.  Just prior to completion of anastomosis it was forebled backbled and thoroughly flushed anastomosis was secured clamps released there is palpable thrill in fistula immediately.  Hemostasis was obtained.  Patient had a palpable radial pulse.  Subcutaneous tissues were reapproximated in the Port-A-Cath and antecubital incision using running 3-0 Vicryl suture.  The skin was closed with a 4-0 Vicryl subcuticular stitch.  Dermabond was applied to the skin.  The patient tolerated procedure well and there were no complications.  The instrument sponge needle count was correct in the case.  Patient was taken to recovery room in stable condition.  Ruta Hinds, MD [facility] Office: [phone] Pager: [phone]

## 2017-11-22 NOTE — Progress Notes (Signed)
Pt's lab work came back with a Hbg of 7.1. Dr. Migdalia Dk called and made aware and is seeing the pt now.

## 2017-11-22 NOTE — Discharge Instructions (Signed)
° °  [facility] ° °Discharge Instructions ° °AV Fistula or Graft Surgery for Dialysis Access ° °Please refer to the following instructions for your post-procedure care. Your surgeon or physician assistant will discuss any changes with you. ° °Activity ° °You may drive the day following your surgery, if you are comfortable and no longer taking prescription pain medication. Resume full activity as the soreness in your incision resolves. ° °Bathing/Showering ° °You may shower after you go home. Keep your incision dry for 48 hours. Do not soak in a bathtub, hot tub, or swim until the incision heals completely. You may not shower if you have a hemodialysis catheter. ° °Incision Care ° °Clean your incision with mild soap and water after 48 hours. Pat the area dry with a clean towel. You do not need a bandage unless otherwise instructed. Do not apply any ointments or creams to your incision. You may have skin glue on your incision. Do not peel it off. It will come off on its own in about one week. Your arm may swell a bit after surgery. To reduce swelling use pillows to elevate your arm so it is above your heart. Your doctor will tell you if you need to lightly wrap your arm with an ACE bandage. ° °Diet ° °Resume your normal diet. There are not special food restrictions following this procedure. In order to heal from your surgery, it is CRITICAL to get adequate nutrition. Your body requires vitamins, minerals, and protein. Vegetables are the best source of vitamins and minerals. Vegetables also provide the perfect balance of protein. Processed food has little nutritional value, so try to avoid this. ° °Medications ° °Resume taking all of your medications. If your incision is causing pain, you may take over-the counter pain relievers such as acetaminophen (Tylenol). If you were prescribed a stronger pain medication, please be aware these medications can cause nausea and constipation. Prevent  nausea by taking the medication with a snack or meal. Avoid constipation by drinking plenty of fluids and eating foods with high amount of fiber, such as fruits, vegetables, and grains. Do not take Tylenol if you are taking prescription pain medications. ° ° ° ° °Follow up °Your surgeon may want to see you in the office following your access surgery. If so, this will be arranged at the time of your surgery. ° °Please call us immediately for any of the following conditions: ° °Increased pain, redness, drainage (pus) from your incision site °Fever of 101 degrees or higher °Severe or worsening pain at your incision site °Hand pain or numbness. ° °Reduce your risk of vascular disease: ° °Stop smoking. If you would like help, call [facility] at 1-800-QUIT-NOW (1-800-784-8669) or [facility] at [phone] ° °Manage your cholesterol °Maintain a desired weight °Control your diabetes °Keep your blood pressure down ° °Dialysis ° °It will take several weeks to several months for your new dialysis access to be ready for use. Your surgeon will determine when it is OK to use it. Your nephrologist will continue to direct your dialysis. You can continue to use your Permcath until your new access is ready for use. ° °If you have any questions, please call the office at [phone]. ° °

## 2017-11-22 NOTE — Anesthesia Procedure Notes (Signed)
Procedure Name: MAC Date/Time: 11/22/2017 1:50 PM Performed by: Renato Shin, CRNA Pre-anesthesia Checklist: Patient identified, Emergency Drugs available, Suction available and Patient being monitored Patient Re-evaluated:Patient Re-evaluated prior to induction Oxygen Delivery Method: Simple face mask Preoxygenation: Pre-oxygenation with 100% oxygen Induction Type: IV induction Placement Confirmation: positive ETCO2,  CO2 detector and breath sounds checked- equal and bilateral Dental Injury: Teeth and Oropharynx as per pre-operative assessment

## 2017-11-22 NOTE — Telephone Encounter (Signed)
sch appt [facility]vm [facility] ltr 01/06/18 1pm Dialysis Duplex 2pm p/o PA

## 2017-11-22 NOTE — Transfer of Care (Signed)
Immediate Anesthesia Transfer of Care Note  Patient: Carly Wood  Procedure(s) Performed: ARTERIOVENOUS (AV) FISTULA CREATION LEFT ARM (Left Arm Lower) REMOVAL PORT-A-CATH LEFT CHEST (Left Chest)  Patient Location: PACU  Anesthesia Type:MAC  Level of Consciousness: drowsy and patient cooperative  Airway & Oxygen Therapy: Patient Spontanous Breathing and Patient connected to face mask oxygen  Post-op Assessment: Report given to RN and Post -op Vital signs reviewed and stable  Post vital signs: Reviewed and stable  Last Vitals:  Vitals Value Taken Time  BP 154/90 11/22/2017  3:19 PM  Temp    Pulse 88 11/22/2017  3:21 PM  Resp 19 11/22/2017  3:21 PM  SpO2 100 % 11/22/2017  3:21 PM  Vitals shown include unvalidated device data.  Last Pain:  Vitals:   11/22/17 0958  TempSrc:   PainSc: 0-No pain         Complications: No apparent anesthesia complications

## 2017-11-23 ENCOUNTER — Encounter (HOSPITAL_COMMUNITY): Payer: Self-pay | Admitting: Vascular Surgery

## 2017-11-23 LAB — HEMOGLOBIN A1C
Hgb A1c MFr Bld: 6.6 % — ABNORMAL HIGH (ref 4.8–5.6)
Mean Plasma Glucose: 143 mg/dL

## 2017-11-23 NOTE — Anesthesia Postprocedure Evaluation (Deleted)
Anesthesia Post Note  Patient: Carly Wood  Procedure(s) Performed: ARTERIOVENOUS (AV) FISTULA CREATION LEFT ARM (Left Arm Lower) REMOVAL PORT-A-CATH LEFT CHEST (Left Chest)     Anesthesia Post Evaluation  Last Vitals:  Vitals:   11/22/17 1620 11/22/17 1630  BP: (!) 158/78 (!) 157/77  Pulse: 75 73  Resp: 20 15  Temp:  36.4 C  SpO2: 97% 97%    Last Pain:  Vitals:   11/22/17 1630  TempSrc:   PainSc: 0-No pain                 Shevawn Langenberg L Danitza Schoenfeldt

## 2017-11-23 NOTE — Anesthesia Postprocedure Evaluation (Signed)
Anesthesia Post Note  Patient: Carly Wood  Procedure(s) Performed: ARTERIOVENOUS (AV) FISTULA CREATION LEFT ARM (Left Arm Lower) REMOVAL PORT-A-CATH LEFT CHEST (Left Chest)     Patient location during evaluation: PACU Anesthesia Type: MAC Level of consciousness: awake and alert Pain management: pain level controlled Vital Signs Assessment: post-procedure vital signs reviewed and stable Respiratory status: spontaneous breathing, nonlabored ventilation, respiratory function stable and patient connected to nasal cannula oxygen Cardiovascular status: stable and blood pressure returned to baseline Postop Assessment: no apparent nausea or vomiting Anesthetic complications: no    Last Vitals:  Vitals:   11/22/17 1620 11/22/17 1630  BP: (!) 158/78 (!) 157/77  Pulse: 75 73  Resp: 20 15  Temp:  36.4 C  SpO2: 97% 97%    Last Pain:  Vitals:   11/22/17 1630  TempSrc:   PainSc: 0-No pain                 Daaiel Starlin L Saydee Zolman

## 2017-11-25 ENCOUNTER — Other Ambulatory Visit: Payer: Self-pay

## 2017-11-25 DIAGNOSIS — N184 Chronic kidney disease, stage 4 (severe): Secondary | ICD-10-CM

## 2018-01-05 NOTE — Progress Notes (Signed)
POST OPERATIVE OFFICE NOTE    CC:  F/u for surgery  HPI:  This is a 82 y.o. female who is s/p removal of left side port-a-cath (for breast cancer) and creation of left BC AVF on 11/22/17 by Dr. Oneida Alar.  She states that she is not yet on dialysis.  She has an appointment with Dr. Lowanda Foster on Tuesday to check her labs.  She denies any pain in her hand.   Overall, she is doing well.   She is not on blood thinners.   Allergies  Allergen Reactions  . Penicillins Other (See Comments)    Unsure of reaction Has patient had a PCN reaction causing immediate rash, facial/tongue/throat swelling, SOB or lightheadedness with hypotension: Unknown Has patient had a PCN reaction causing severe rash involving mucus membranes or skin necrosis: Unknown Has patient had a PCN reaction that required hospitalization: No Has patient had a PCN reaction occurring within the last 10 years: Unknown If all of the above answers are "NO", then may proceed with Cephalosporin use.    . Ace Inhibitors Cough and Other (See Comments)    Tongue swell  . Lisinopril Other (See Comments)    Tongue swell    Current Outpatient Medications  Medication Sig Dispense Refill  . acetaminophen (TYLENOL) 500 MG tablet Take 500 mg by mouth every 6 (six) hours as needed.     . Alcohol Swabs (ALCOHOL PREP) 70 % PADS See admin instructions.  12  . amLODipine (NORVASC) 5 MG tablet Take 5 mg by mouth daily.     Marland Kitchen anastrozole (ARIMIDEX) 1 MG tablet Take 1 tablet (1 mg total) by mouth daily. 90 tablet 3  . anti-nausea (EMETROL) solution Take 10 mLs by mouth every 15 (fifteen) minutes as needed for nausea.     Marland Kitchen AQUALANCE LANCETS 30G MISC USE TO test twice daily  99  . aspirin EC 81 MG tablet Take 81 mg by mouth daily.    . calcitRIOL (ROCALTROL) 0.25 MCG capsule Take 0.25 mcg by mouth daily.    . calcium-vitamin D (OSCAL 500/200 D-3) 500-200 MG-UNIT tablet Take 1 tablet by mouth daily with breakfast.     . cloNIDine (CATAPRES) 0.3 MG  tablet Take 0.3 mg by mouth 2 (two) times daily.     Marland Kitchen HYDROcodone-acetaminophen (NORCO) 5-325 MG tablet Take 1 tablet by mouth every 6 (six) hours as needed for moderate pain. 10 tablet 0  . Insulin Glargine (TOUJEO MAX SOLOSTAR) 300 UNIT/ML SOPN Inject 5 Units into the skin every evening.    . iron polysaccharides (NIFEREX) 150 MG capsule Take 150 mg by mouth daily.    Elmore Guise Devices (ADJUSTABLE LANCING DEVICE) MISC TO check blood glucose  1  . lidocaine-prilocaine (EMLA) cream Apply 1 application topically as needed. Apply to port site prior when needed for chemo, labs or port flushes. (Patient not taking: Reported on 11/16/2017) 30 g 2  . loperamide (LOPERAMIDE A-D) 2 MG tablet Days 1-14 take 2 tabs three times a day Days 15-56 take 2 tabs two times a day Days 57-365 take 2 tabs as needed (max dose 16 mg/day) (Patient not taking: Reported on 11/16/2017) 140 tablet 3  . Neratinib Maleate (NERLYNX) 40 MG tablet Take 6 tablets (240 mg total) by mouth daily. Take with food. (Patient not taking: Reported on 11/16/2017) 180 tablet 0  . ondansetron (ZOFRAN) 8 MG tablet Take 1 tablet (8 mg total) by mouth every 8 (eight) hours as needed for nausea or vomiting. (Patient not  taking: Reported on 11/16/2017) 30 tablet 2  . ONGLYZA 5 MG TABS tablet Take 5 mg by mouth daily.  11  . PARoxetine (PAXIL) 20 MG tablet Take 20 mg by mouth daily.     . pioglitazone (ACTOS) 30 MG tablet Take 15 mg by mouth daily.     . pravastatin (PRAVACHOL) 20 MG tablet Take 20 mg by mouth every evening.     . prochlorperazine (COMPAZINE) 10 MG tablet Take 1 tablet (10 mg total) by mouth every 6 (six) hours as needed for nausea or vomiting. (Patient not taking: Reported on 11/16/2017) 30 tablet 2  . traZODone (DESYREL) 50 MG tablet Take 50 mg by mouth at bedtime.   2   No current facility-administered medications for this visit.      ROS:  See HPI  Physical Exam:  Today's Vitals   01/06/18 1329  BP: (!) 147/71  Pulse: 62    Resp: 16  Temp: (!) 97.1 F (36.2 C)  TempSrc: Oral  SpO2: 95%  Weight: 191 lb (86.6 kg)  Height: 5' 3.5" (1.613 m)   Body mass index is 33.3 kg/m.  Incision:  Left chest incision and left arm incisions have healed nicely Extremities:  +palpable left radial pulse; there is an excellent thrill/bruit within the fistula.  Fistula becomes more difficult to palpate midway up the upper arm.  Dialysis duplex 01/06/18: Diameter:  0.60cm-1.10cm Depth:  0.12cm-1.66cm   Assessment/Plan:  This is a 82 y.o. female who is s/p: removal of left side port-a-cath and creation of left BC AVF on 11/22/17 by Dr. Oneida Alar  -pt doing well and no evidence of steal sx.  -pt's fistula has matured nicely, however, midway up the upper arm, it becomes deep.  Discussed with pt and her family member that this would most likely need to be superficialized.  I discussed why this needed to be done and that it will most likely involve a couple of incisions on the upper arm to superficialize it.  She has an appointment with Dr. Lowanda Foster to check labs on Tuesday.  Will have patient come back to follow up with Dr. Oneida Alar in 2-3 weeks to discuss surgery further.     Leontine Locket, PA-C [facility] [phone]  Clinic MD:  Scot Dock

## 2018-01-06 ENCOUNTER — Ambulatory Visit (INDEPENDENT_AMBULATORY_CARE_PROVIDER_SITE_OTHER): Payer: Self-pay | Admitting: Physician Assistant

## 2018-01-06 ENCOUNTER — Other Ambulatory Visit: Payer: Self-pay

## 2018-01-06 ENCOUNTER — Ambulatory Visit (HOSPITAL_COMMUNITY)
Admission: RE | Admit: 2018-01-06 | Discharge: 2018-01-06 | Disposition: A | Discharge status: Home / Self Care | Payer: Medicare Other | Source: Ambulatory Visit | Attending: Internal Medicine | Admitting: Internal Medicine

## 2018-01-06 VITALS — BP 147/71 | HR 62 | Temp 97.1°F | Resp 16 | Ht 63.5 in | Wt 191.0 lb

## 2018-01-06 DIAGNOSIS — N184 Chronic kidney disease, stage 4 (severe): Secondary | ICD-10-CM

## 2018-01-27 ENCOUNTER — Ambulatory Visit (INDEPENDENT_AMBULATORY_CARE_PROVIDER_SITE_OTHER): Payer: Self-pay | Admitting: Vascular Surgery

## 2018-01-27 ENCOUNTER — Encounter: Payer: Self-pay | Admitting: Vascular Surgery

## 2018-01-27 ENCOUNTER — Other Ambulatory Visit: Payer: Self-pay

## 2018-01-27 VITALS — BP 156/73 | HR 64 | Temp 97.1°F | Resp 16 | Ht 63.0 in | Wt 190.0 lb

## 2018-01-27 DIAGNOSIS — N184 Chronic kidney disease, stage 4 (severe): Secondary | ICD-10-CM

## 2018-01-27 NOTE — Progress Notes (Signed)
Patient is a 82 year old female who returns for follow-up today.  She had placement of a left brachiocephalic AV fistula November 22, 2017.  She was seen in early postoperative follow-up and on duplex scan noted to have good maturation of the fistula 6 to 7 mm diameter.  However it was deep about 1 cm over most of its course.  She is here today to consider whether or not to proceed with Superficialization.  The patient is not currently on hemodialysis.  Her primary nephrologist is Dr. Lowanda Foster.  Physical exam:  Vitals:   01/27/18 1319  BP: (!) 156/73  Pulse: 64  Resp: 16  Temp: (!) 97.1 F (36.2 C)  TempSrc: Oral  SpO2: 97%  Weight: 190 lb (86.2 kg)  Height: 5\' 3"  (1.6 m)    Left upper extremity: 1+ left radial pulse palpable thrill in fistula the fistula is palpable over the first 4 to 5 cm but then becomes deep in the mid upper arm and nonpalpable.  Has an easily audible bruit.  Assessment: Mature AV fistula left arm.  Proximal 4 5 cm is probably okay to cannulate.  However, the distal half of the fistula is very difficult to palpate and may be difficult for cannulation.  Plan: I offered the patient Superficialization of her left arm AV fistula today to make it easier for cannulation in the future.  She currently wishes to think about whether or not to proceed.  She will call me within the next few days if she wishes to proceed.  Risk benefits possible complications of procedure details including not limited to bleeding infection fistula thrombosis all explained to the patient today as well as possible other procedures.  She understands and will call me if she wishes to proceed in the near future.  Ruta Hinds, MD [facility] Office: [phone] Pager: [phone]

## 2018-01-27 NOTE — H&P (View-Only) (Signed)
Patient is a 82 year old female who returns for follow-up today.  She had placement of a left brachiocephalic AV fistula November 22, 2017.  She was seen in early postoperative follow-up and on duplex scan noted to have good maturation of the fistula 6 to 7 mm diameter.  However it was deep about 1 cm over most of its course.  She is here today to consider whether or not to proceed with Superficialization.  The patient is not currently on hemodialysis.  Her primary nephrologist is Dr. Lowanda Foster.  Physical exam:  Vitals:   01/27/18 1319  BP: (!) 156/73  Pulse: 64  Resp: 16  Temp: (!) 97.1 F (36.2 C)  TempSrc: Oral  SpO2: 97%  Weight: 190 lb (86.2 kg)  Height: 5\' 3"  (1.6 m)    Left upper extremity: 1+ left radial pulse palpable thrill in fistula the fistula is palpable over the first 4 to 5 cm but then becomes deep in the mid upper arm and nonpalpable.  Has an easily audible bruit.  Assessment: Mature AV fistula left arm.  Proximal 4 5 cm is probably okay to cannulate.  However, the distal half of the fistula is very difficult to palpate and may be difficult for cannulation.  Plan: I offered the patient Superficialization of her left arm AV fistula today to make it easier for cannulation in the future.  She currently wishes to think about whether or not to proceed.  She will call me within the next few days if she wishes to proceed.  Risk benefits possible complications of procedure details including not limited to bleeding infection fistula thrombosis all explained to the patient today as well as possible other procedures.  She understands and will call me if she wishes to proceed in the near future.  Ruta Hinds, MD [facility] Office: [phone] Pager: [phone]

## 2018-02-09 ENCOUNTER — Other Ambulatory Visit: Payer: Self-pay | Admitting: *Deleted

## 2018-02-09 NOTE — Progress Notes (Signed)
Call from patient's sister/caregiver Deneise Lever. Wishing to schedule surgery ASAP on a Monday. She states she is concerned they will need access for dialysis soon.  Will schedule with Dr. Scot Dock. Instructed to be at [facility] admitting at 5:30 am on 02/14/18. NPO past MN night prior and to follow the detailed surgery, medication and insulin adjustment instructions received from the hospital pre-admission department for this procedure. Verbalized understanding.

## 2018-02-11 ENCOUNTER — Encounter (HOSPITAL_COMMUNITY): Payer: Self-pay | Admitting: *Deleted

## 2018-02-11 ENCOUNTER — Other Ambulatory Visit: Payer: Self-pay

## 2018-02-11 NOTE — Progress Notes (Signed)
Spoke with pt's sister, Carly Wood for pre-op call. Pt has dementia. No known cardiac history. Pt is a type 2 Diabetic, last A1C was 6.6 on 11/22/17. Carly Wood states pt's fasting blood sugar is usually between 110-120. Instructed Carly Wood to give pt 1/2 of her regular dose of Toujeo Insulin Sunday evening, will give her 2 units. Instructed her to check pt's blood sugar Monday AM when she gets up. If blood sugar is 70 or below, treat with 1/2 cup of clear juice (apple or cranberry) and recheck blood sugar 15 minutes after drinking juice. She voiced understanding.

## 2018-02-13 NOTE — Anesthesia Preprocedure Evaluation (Signed)
Anesthesia Evaluation  Patient identified by MRN, date of birth, ID band Patient awake    Reviewed: Allergy & Precautions, NPO status , Patient's Chart, lab work & pertinent test results  History of Anesthesia Complications (+) Emergence Delirium and history of anesthetic complications  Airway Mallampati: II  TM Distance: >3 FB Neck ROM: Full    Dental no notable dental hx. (+) Dental Advisory Given   Pulmonary neg pulmonary ROS,    Pulmonary exam normal        Cardiovascular hypertension, Pt. on medications Normal cardiovascular exam     Neuro/Psych Anxiety Depression Dementia negative neurological ROS     GI/Hepatic Neg liver ROS, PUD, GERD  ,  Endo/Other  diabetes  Renal/GU CRFRenal disease     Musculoskeletal   Abdominal   Peds  Hematology  (+) Blood dyscrasia, anemia ,   Anesthesia Other Findings   Reproductive/Obstetrics                             Anesthesia Physical  Anesthesia Plan  ASA: III  Anesthesia Plan: MAC   Post-op Pain Management:    Induction: Intravenous  PONV Risk Score and Plan: 2 and Treatment may vary due to age or medical condition and Ondansetron  Airway Management Planned: Natural Airway and Simple Face Mask  Additional Equipment:   Intra-op Plan:   Post-operative Plan:   Informed Consent: I have reviewed the patients History and Physical, chart, labs and discussed the procedure including the risks, benefits and alternatives for the proposed anesthesia with the patient or authorized representative who has indicated his/her understanding and acceptance.   Dental advisory given  Plan Discussed with: CRNA and Anesthesiologist  Anesthesia Plan Comments:         Anesthesia Quick Evaluation

## 2018-02-14 ENCOUNTER — Other Ambulatory Visit: Payer: Self-pay

## 2018-02-14 ENCOUNTER — Telehealth: Payer: Self-pay | Admitting: Vascular Surgery

## 2018-02-14 ENCOUNTER — Ambulatory Visit (HOSPITAL_COMMUNITY): Payer: Medicare Other | Admitting: Anesthesiology

## 2018-02-14 ENCOUNTER — Encounter (HOSPITAL_COMMUNITY): Payer: Self-pay | Admitting: Anesthesiology

## 2018-02-14 ENCOUNTER — Ambulatory Visit (HOSPITAL_COMMUNITY)
Admission: RE | Admit: 2018-02-14 | Discharge: 2018-02-14 | Disposition: A | Discharge status: Home / Self Care | Payer: Medicare Other | Source: Ambulatory Visit | Attending: Vascular Surgery | Admitting: Vascular Surgery

## 2018-02-14 ENCOUNTER — Encounter (HOSPITAL_COMMUNITY)
Admission: RE | Disposition: A | Discharge status: Home / Self Care | Payer: Self-pay | Source: Ambulatory Visit | Attending: Vascular Surgery

## 2018-02-14 DIAGNOSIS — M8580 Other specified disorders of bone density and structure, unspecified site: Secondary | ICD-10-CM | POA: Insufficient documentation

## 2018-02-14 DIAGNOSIS — Z8711 Personal history of peptic ulcer disease: Secondary | ICD-10-CM | POA: Insufficient documentation

## 2018-02-14 DIAGNOSIS — Z794 Long term (current) use of insulin: Secondary | ICD-10-CM | POA: Insufficient documentation

## 2018-02-14 DIAGNOSIS — R9431 Abnormal electrocardiogram [ECG] [EKG]: Secondary | ICD-10-CM | POA: Insufficient documentation

## 2018-02-14 DIAGNOSIS — Z7982 Long term (current) use of aspirin: Secondary | ICD-10-CM | POA: Insufficient documentation

## 2018-02-14 DIAGNOSIS — N184 Chronic kidney disease, stage 4 (severe): Secondary | ICD-10-CM | POA: Diagnosis not present

## 2018-02-14 DIAGNOSIS — H409 Unspecified glaucoma: Secondary | ICD-10-CM | POA: Diagnosis not present

## 2018-02-14 DIAGNOSIS — Z79899 Other long term (current) drug therapy: Secondary | ICD-10-CM | POA: Insufficient documentation

## 2018-02-14 DIAGNOSIS — Y832 Surgical operation with anastomosis, bypass or graft as the cause of abnormal reaction of the patient, or of later complication, without mention of misadventure at the time of the procedure: Secondary | ICD-10-CM | POA: Diagnosis not present

## 2018-02-14 DIAGNOSIS — K219 Gastro-esophageal reflux disease without esophagitis: Secondary | ICD-10-CM | POA: Diagnosis not present

## 2018-02-14 DIAGNOSIS — I129 Hypertensive chronic kidney disease with stage 1 through stage 4 chronic kidney disease, or unspecified chronic kidney disease: Secondary | ICD-10-CM | POA: Insufficient documentation

## 2018-02-14 DIAGNOSIS — Z880 Allergy status to penicillin: Secondary | ICD-10-CM | POA: Diagnosis not present

## 2018-02-14 DIAGNOSIS — T82898A Other specified complication of vascular prosthetic devices, implants and grafts, initial encounter: Secondary | ICD-10-CM | POA: Insufficient documentation

## 2018-02-14 DIAGNOSIS — Z888 Allergy status to other drugs, medicaments and biological substances status: Secondary | ICD-10-CM | POA: Diagnosis not present

## 2018-02-14 DIAGNOSIS — E1122 Type 2 diabetes mellitus with diabetic chronic kidney disease: Secondary | ICD-10-CM | POA: Insufficient documentation

## 2018-02-14 DIAGNOSIS — Z9011 Acquired absence of right breast and nipple: Secondary | ICD-10-CM | POA: Diagnosis not present

## 2018-02-14 HISTORY — DX: Pneumonia, unspecified organism: J18.9

## 2018-02-14 HISTORY — PX: FISTULA SUPERFICIALIZATION: SHX6341

## 2018-02-14 LAB — POCT I-STAT 4, (NA,K, GLUC, HGB,HCT)
Glucose, Bld: 123 mg/dL — ABNORMAL HIGH (ref 70–99)
HCT: 27 % — ABNORMAL LOW (ref 36.0–46.0)
Hemoglobin: 9.2 g/dL — ABNORMAL LOW (ref 12.0–15.0)
Potassium: 4.2 mmol/L (ref 3.5–5.1)
Sodium: 144 mmol/L (ref 135–145)

## 2018-02-14 LAB — GLUCOSE, CAPILLARY
Glucose-Capillary: 117 mg/dL — ABNORMAL HIGH (ref 70–99)
Glucose-Capillary: 135 mg/dL — ABNORMAL HIGH (ref 70–99)

## 2018-02-14 SURGERY — FISTULA SUPERFICIALIZATION
Anesthesia: Monitor Anesthesia Care | Site: Arm Upper | Laterality: Left

## 2018-02-14 MED ORDER — FENTANYL CITRATE (PF) 100 MCG/2ML IJ SOLN
INTRAMUSCULAR | Status: DC | PRN
  Administered 2018-02-14 (×2): 25 ug via INTRAVENOUS

## 2018-02-14 MED ORDER — PROPOFOL 10 MG/ML IV BOLUS
INTRAVENOUS | Status: AC
  Filled 2018-02-14: qty 20

## 2018-02-14 MED ORDER — PROPOFOL 500 MG/50ML IV EMUL
INTRAVENOUS | Status: DC | PRN
  Administered 2018-02-14: 100 ug/kg/min via INTRAVENOUS

## 2018-02-14 MED ORDER — LIDOCAINE HCL (PF) 1 % IJ SOLN
INTRAMUSCULAR | Status: AC
  Filled 2018-02-14: qty 30

## 2018-02-14 MED ORDER — ONDANSETRON HCL 4 MG/2ML IJ SOLN
INTRAMUSCULAR | Status: DC | PRN
  Administered 2018-02-14: 4 mg via INTRAVENOUS

## 2018-02-14 MED ORDER — VANCOMYCIN HCL IN DEXTROSE 1-5 GM/200ML-% IV SOLN
1000.0000 mg | INTRAVENOUS | Status: AC
  Administered 2018-02-14: 1000 mg via INTRAVENOUS
  Filled 2018-02-14: qty 200

## 2018-02-14 MED ORDER — SODIUM CHLORIDE 0.9 % IV SOLN
INTRAVENOUS | Status: DC
  Administered 2018-02-14: 07:00:00 via INTRAVENOUS

## 2018-02-14 MED ORDER — ROCURONIUM BROMIDE 50 MG/5ML IV SOSY
PREFILLED_SYRINGE | INTRAVENOUS | Status: AC
  Filled 2018-02-14: qty 5

## 2018-02-14 MED ORDER — ONDANSETRON HCL 4 MG/2ML IJ SOLN
INTRAMUSCULAR | Status: AC
  Filled 2018-02-14: qty 4

## 2018-02-14 MED ORDER — EPHEDRINE 5 MG/ML INJ
INTRAVENOUS | Status: AC
  Filled 2018-02-14: qty 10

## 2018-02-14 MED ORDER — DEXMEDETOMIDINE HCL 200 MCG/2ML IV SOLN
INTRAVENOUS | Status: DC | PRN
  Administered 2018-02-14 (×2): 8 ug via INTRAVENOUS

## 2018-02-14 MED ORDER — FENTANYL CITRATE (PF) 250 MCG/5ML IJ SOLN
INTRAMUSCULAR | Status: AC
  Filled 2018-02-14: qty 5

## 2018-02-14 MED ORDER — LIDOCAINE 2% (20 MG/ML) 5 ML SYRINGE
INTRAMUSCULAR | Status: AC
  Filled 2018-02-14: qty 10

## 2018-02-14 MED ORDER — ESMOLOL HCL 100 MG/10ML IV SOLN
INTRAVENOUS | Status: DC | PRN
  Administered 2018-02-14 (×3): 10 mg via INTRAVENOUS

## 2018-02-14 MED ORDER — PROPOFOL 10 MG/ML IV BOLUS
INTRAVENOUS | Status: DC | PRN
  Administered 2018-02-14: 30 mg via INTRAVENOUS

## 2018-02-14 MED ORDER — SODIUM CHLORIDE 0.9 % IV SOLN
INTRAVENOUS | Status: DC | PRN
  Administered 2018-02-14: 500 mL

## 2018-02-14 MED ORDER — CHLORHEXIDINE GLUCONATE 4 % EX LIQD: 1.0000 "application " | Freq: Once | CUTANEOUS | Status: DC

## 2018-02-14 MED ORDER — MEPERIDINE HCL 50 MG/ML IJ SOLN: 6.2500 mg | INTRAMUSCULAR | Status: DC | PRN

## 2018-02-14 MED ORDER — DEXAMETHASONE SODIUM PHOSPHATE 10 MG/ML IJ SOLN
INTRAMUSCULAR | Status: AC
  Filled 2018-02-14: qty 1

## 2018-02-14 MED ORDER — 0.9 % SODIUM CHLORIDE (POUR BTL) OPTIME
TOPICAL | Status: DC | PRN
  Administered 2018-02-14: 1000 mL

## 2018-02-14 MED ORDER — OXYCODONE HCL 5 MG PO TABS: 5.0000 mg | ORAL_TABLET | ORAL | 0 refills | Status: DC | PRN

## 2018-02-14 MED ORDER — SODIUM CHLORIDE 0.9 % IV SOLN
INTRAVENOUS | Status: AC
  Filled 2018-02-14: qty 1.2

## 2018-02-14 MED ORDER — LIDOCAINE HCL (PF) 1 % IJ SOLN
INTRAMUSCULAR | Status: DC | PRN
  Administered 2018-02-14: 30 mL

## 2018-02-14 MED ORDER — PHENYLEPHRINE 40 MCG/ML (10ML) SYRINGE FOR IV PUSH (FOR BLOOD PRESSURE SUPPORT)
PREFILLED_SYRINGE | INTRAVENOUS | Status: AC
  Filled 2018-02-14: qty 10

## 2018-02-14 MED ORDER — LIDOCAINE-EPINEPHRINE (PF) 1 %-1:200000 IJ SOLN
INTRAMUSCULAR | Status: DC | PRN
  Administered 2018-02-14: 30 mL

## 2018-02-14 MED ORDER — SUCCINYLCHOLINE CHLORIDE 200 MG/10ML IV SOSY
PREFILLED_SYRINGE | INTRAVENOUS | Status: AC
  Filled 2018-02-14: qty 10

## 2018-02-14 MED ORDER — THROMBIN (RECOMBINANT) 20000 UNITS EX SOLR
CUTANEOUS | Status: AC
  Filled 2018-02-14: qty 20000

## 2018-02-14 MED ORDER — FENTANYL CITRATE (PF) 100 MCG/2ML IJ SOLN: 25.0000 ug | INTRAMUSCULAR | Status: DC | PRN

## 2018-02-14 SURGICAL SUPPLY — 39 items
ADH SKN CLS APL DERMABOND .7 (GAUZE/BANDAGES/DRESSINGS) ×1
ARMBAND PINK RESTRICT EXTREMIT (MISCELLANEOUS) ×2 IMPLANT
CANISTER SUCT 3000ML PPV (MISCELLANEOUS) ×2 IMPLANT
CANNULA VESSEL 3MM 2 BLNT TIP (CANNULA) ×2 IMPLANT
CLIP VESOCCLUDE MED 6/CT (CLIP) ×2 IMPLANT
CLIP VESOCCLUDE SM WIDE 24/CT (CLIP) ×1 IMPLANT
CLIP VESOCCLUDE SM WIDE 6/CT (CLIP) ×2 IMPLANT
COVER PROBE W GEL 5X96 (DRAPES) ×2 IMPLANT
COVER WAND RF STERILE (DRAPES) ×2 IMPLANT
DECANTER SPIKE VIAL GLASS SM (MISCELLANEOUS) ×4 IMPLANT
DERMABOND ADVANCED (GAUZE/BANDAGES/DRESSINGS) ×1
DERMABOND ADVANCED .7 DNX12 (GAUZE/BANDAGES/DRESSINGS) ×1 IMPLANT
ELECT REM PT RETURN 9FT ADLT (ELECTROSURGICAL) ×2
ELECTRODE REM PT RTRN 9FT ADLT (ELECTROSURGICAL) ×1 IMPLANT
GLOVE BIO SURGEON STRL SZ 6.5 (GLOVE) ×1 IMPLANT
GLOVE BIO SURGEON STRL SZ7.5 (GLOVE) ×2 IMPLANT
GLOVE BIOGEL PI IND STRL 6.5 (GLOVE) IMPLANT
GLOVE BIOGEL PI IND STRL 7.5 (GLOVE) IMPLANT
GLOVE BIOGEL PI IND STRL 8 (GLOVE) ×1 IMPLANT
GLOVE BIOGEL PI INDICATOR 6.5 (GLOVE) ×4
GLOVE BIOGEL PI INDICATOR 7.5 (GLOVE) ×1
GLOVE BIOGEL PI INDICATOR 8 (GLOVE) ×1
GLOVE ECLIPSE 6.5 STRL STRAW (GLOVE) ×1 IMPLANT
GLOVE ECLIPSE 7.0 STRL STRAW (GLOVE) ×1 IMPLANT
GOWN STRL REUS W/ TWL LRG LVL3 (GOWN DISPOSABLE) ×3 IMPLANT
GOWN STRL REUS W/TWL LRG LVL3 (GOWN DISPOSABLE) ×8
KIT BASIN OR (CUSTOM PROCEDURE TRAY) ×2 IMPLANT
KIT TURNOVER KIT B (KITS) ×2 IMPLANT
NS IRRIG 1000ML POUR BTL (IV SOLUTION) ×2 IMPLANT
PACK CV ACCESS (CUSTOM PROCEDURE TRAY) ×2 IMPLANT
PAD ARMBOARD 7.5X6 YLW CONV (MISCELLANEOUS) ×4 IMPLANT
SPONGE SURGIFOAM ABS GEL 100 (HEMOSTASIS) IMPLANT
SUT PROLENE 6 0 BV (SUTURE) ×2 IMPLANT
SUT VIC AB 3-0 SH 27 (SUTURE) ×2
SUT VIC AB 3-0 SH 27X BRD (SUTURE) ×1 IMPLANT
SUT VICRYL 4-0 PS2 18IN ABS (SUTURE) ×2 IMPLANT
TOWEL GREEN STERILE (TOWEL DISPOSABLE) ×2 IMPLANT
UNDERPAD 30X30 (UNDERPADS AND DIAPERS) ×2 IMPLANT
WATER STERILE IRR 1000ML POUR (IV SOLUTION) ×2 IMPLANT

## 2018-02-14 NOTE — Transfer of Care (Signed)
Immediate Anesthesia Transfer of Care Note  Patient: Carly Wood  Procedure(s) Performed: FISTULA SUPERFICIALIZATION LEFT ARM (Left Arm Upper)  Patient Location: PACU  Anesthesia Type:MAC  Level of Consciousness: awake, alert  and oriented  Airway & Oxygen Therapy: Patient Spontanous Breathing  Post-op Assessment: Report given to RN and Post -op Vital signs reviewed and stable  Post vital signs: Reviewed and stable  Last Vitals:  Vitals Value Taken Time  BP    Temp    Pulse 103 02/14/2018  8:52 AM  Resp 28 02/14/2018  8:52 AM  SpO2 96 % 02/14/2018  8:52 AM  Vitals shown include unvalidated device data.  Last Pain:  Vitals:   02/14/18 0650  TempSrc:   PainSc: 0-No pain      Patients Stated Pain Goal: 3 (91/50/41 3643)  Complications: No apparent anesthesia complications

## 2018-02-14 NOTE — Anesthesia Procedure Notes (Signed)
Procedure Name: MAC Date/Time: 02/14/2018 7:41 AM Performed by: Marsa Aris, CRNA Pre-anesthesia Checklist: Timeout performed, Patient being monitored, Suction available, Patient identified and Emergency Drugs available Patient Re-evaluated:Patient Re-evaluated prior to induction Oxygen Delivery Method: Simple face mask Preoxygenation: Pre-oxygenation with 100% oxygen

## 2018-02-14 NOTE — Interval H&P Note (Signed)
History and Physical Interval Note:  02/14/2018 7:21 AM  Carly Wood  has presented today for surgery, with the diagnosis of COMPLICATION WITH ARTERIOVENOUS FISTULA LEFT ARM  The various methods of treatment have been discussed with the patient and family. After consideration of risks, benefits and other options for treatment, the patient has consented to  Procedure(s): FISTULA SUPERFICIALIZATION LEFT ARM (Left) as a surgical intervention .  The patient's history has been reviewed, patient examined, no change in status, stable for surgery.  I have reviewed the patient's chart and labs.  Questions were answered to the patient's satisfaction.     Deitra Mayo

## 2018-02-14 NOTE — Op Note (Signed)
    NAME: Carly Wood    MRN: [id number] DOB: [birth year]    DATE OF OPERATION: 02/14/2018  PREOP DIAGNOSIS:    Status post left brachiocephalic AV fistula  POSTOP DIAGNOSIS:    Same  PROCEDURE:    Superficialization of left brachiocephalic AV fistula  SURGEON: Judeth Cornfield. Scot Dock, MD, FACS  ASSIST: Laurence Slate, PA  ANESTHESIA: Local with sedation  EBL: Minimal  INDICATIONS:    Carly Wood is a 82 y.o. female who had a left brachiocephalic AV fistula placed.  She has not yet on dialysis.  The vein was too deep and she was set up for Superficialization.   FINDINGS:   Excellent thrill at the completion of the procedure.  TECHNIQUE:   The patient was taken to the operating room and sedated by anesthesia.  The left upper extremity was prepped and draped in usual sterile fashion.  I marked the vein using the SonoSite.  After the skin was anesthetized with 1% lidocaine a longitudinal incision was made at the junction of the mid to distal upper arm and the vein was dissected free circumferentially.  I excised a large amount of adipose tissue both in the distal direction and proximal direction.  The vein was fully mobilized from the posterior fascia and the anterior fascia divided.  This dissection continued centrally.  I then made a separate incision in the upper aspect of the upper arm where this was continued.  Again a large amount of adipose tissue was debrided both distally and proximally and the vein was completely circumferentially mobilized with multiple small branches divided between clips and 3-0 silk ties.  The vein was mobilized from the posterior fascia and the anterior fascia was divided.  At the completion there was an excellent thrill in the fistula.  These 2 incisions were each closed with a deep layer of 3-0 Vicryl and the skin closed with 4-0 Vicryl.  Dermabond was applied.  The patient tolerated the procedure well and was transferred to the recovery room in  stable condition.  All needle and sponge counts were correct.  Deitra Mayo, MD, FACS [facility]  DATE OF DICTATION:   02/14/2018

## 2018-02-14 NOTE — Progress Notes (Signed)
C/o 'chest heaviness" / no assoc sxs/ has not felt this way before/ spoke with dr Luane School, ekg oredered

## 2018-02-14 NOTE — Anesthesia Postprocedure Evaluation (Signed)
Anesthesia Post Note  Patient: Carly Wood  Procedure(s) Performed: FISTULA SUPERFICIALIZATION LEFT ARM (Left Arm Upper)     Patient location during evaluation: PACU Anesthesia Type: MAC Level of consciousness: awake and alert Pain management: pain level controlled Vital Signs Assessment: post-procedure vital signs reviewed and stable Respiratory status: spontaneous breathing, nonlabored ventilation, respiratory function stable and patient connected to nasal cannula oxygen Cardiovascular status: stable and blood pressure returned to baseline Postop Assessment: no apparent nausea or vomiting Anesthetic complications: no    Last Vitals:  Vitals:   02/14/18 0931 02/14/18 0948  BP: (!) 145/93 (!) 145/85  Pulse:    Resp: 15 16  Temp:    SpO2: 94% 95%    Last Pain:  Vitals:   02/14/18 0948  TempSrc:   PainSc: 0-No pain                 Waleed Dettman

## 2018-02-14 NOTE — Telephone Encounter (Signed)
-----   Message from Mena Goes, RN sent at 02/14/2018 10:33 AM EST ----- Regarding: 8 weeks with Duplex and see Dr. Scot Dock   ----- Message ----- From: Angelia Mould, MD Sent: 02/14/2018   8:34 AM EST To: [facility] Charge Pool Subject: charge and f/u                                 Superficialization of left brachiocephalic AV fistula  SURGEON: Judeth Cornfield. Scot Dock, MD, FACS  ASSIST: Laurence Slate, PA  I will need a follow-up visit with her in approximately 8 weeks with a duplex at that time to check on her fistula.  Thank you.  I would like the visit to be with me please thank you. CD

## 2018-02-14 NOTE — Progress Notes (Signed)
Pt seen/ ekg reviewed/ feels better per pt/ dr Luane School ok for d/c

## 2018-02-14 NOTE — Telephone Encounter (Signed)
sch appt spk to pt daughter 04/13/2018 9am Dialysis Duplex 1015am p/o MD

## 2018-02-15 ENCOUNTER — Encounter (HOSPITAL_COMMUNITY): Payer: Self-pay | Admitting: Vascular Surgery

## 2018-02-16 ENCOUNTER — Telehealth: Payer: Self-pay | Admitting: Vascular Surgery

## 2018-02-16 NOTE — Telephone Encounter (Signed)
Nurse with [facility] called to say Ms. Calvey is refusing Home Health services today.    Thurston Hole., LPN

## 2018-03-23 ENCOUNTER — Telehealth (HOSPITAL_COMMUNITY): Payer: Self-pay | Admitting: Emergency Medicine

## 2018-03-23 NOTE — Telephone Encounter (Signed)
Thayer Headings from Dr Liz Malady office called to see if it would be ok to start epogen on pt since she had breast cancer. They want [place] to start the injections.  Spoke with Dr Walden Field and she is fine with [place] doing the injections.  Pt has not started dialysis yet. They are going to fax over the orders.

## 2018-04-01 ENCOUNTER — Telehealth (HOSPITAL_COMMUNITY): Payer: Self-pay | Admitting: *Deleted

## 2018-04-08 ENCOUNTER — Other Ambulatory Visit: Payer: Self-pay

## 2018-04-08 DIAGNOSIS — N184 Chronic kidney disease, stage 4 (severe): Secondary | ICD-10-CM

## 2018-04-08 NOTE — Discharge Instructions (Signed)
Epoetin Alfa injection °What is this medicine? °EPOETIN ALFA (e POE e tin AL fa) helps your body make more red blood cells. This medicine is used to treat anemia caused by chronic kidney disease, cancer chemotherapy, or HIV-therapy. It may also be used before surgery if you have anemia. °This medicine may be used for other purposes; ask your health care provider or pharmacist if you have questions. °COMMON BRAND NAME(S): Epogen, Procrit, Retacrit °What should I tell my health care provider before I take this medicine? °They need to know if you have any of these conditions: °-cancer °-heart disease °-high blood pressure °-history of blood clots °-history of stroke °-low levels of folate, iron, or vitamin B12 in the blood °-seizures °-an unusual or allergic reaction to erythropoietin, albumin, benzyl alcohol, hamster proteins, other medicines, foods, dyes, or preservatives °-pregnant or trying to get pregnant °-breast-feeding °How should I use this medicine? °This medicine is for injection into a vein or under the skin. It is usually given by a health care professional in a hospital or clinic setting. °If you get this medicine at home, you will be taught how to prepare and give this medicine. Use exactly as directed. Take your medicine at regular intervals. Do not take your medicine more often than directed. °It is important that you put your used needles and syringes in a special sharps container. Do not put them in a trash can. If you do not have a sharps container, call your pharmacist or healthcare provider to get one. °A special MedGuide will be given to you by the pharmacist with each prescription and refill. Be sure to read this information carefully each time. °Talk to your pediatrician regarding the use of this medicine in children. While this drug may be prescribed for selected conditions, precautions do apply. °Overdosage: If you think you have taken too much of this medicine contact a poison control center  or emergency room at once. °NOTE: This medicine is only for you. Do not share this medicine with others. °What if I miss a dose? °If you miss a dose, take it as soon as you can. If it is almost time for your next dose, take only that dose. Do not take double or extra doses. °What may interact with this medicine? °Interactions have not been studied. °This list may not describe all possible interactions. Give your health care provider a list of all the medicines, herbs, non-prescription drugs, or dietary supplements you use. Also tell them if you smoke, drink alcohol, or use illegal drugs. Some items may interact with your medicine. °What should I watch for while using this medicine? °Your condition will be monitored carefully while you are receiving this medicine. °You may need blood work done while you are taking this medicine. °This medicine may cause a decrease in vitamin B6. You should make sure that you get enough vitamin B6 while you are taking this medicine. Discuss the foods you eat and the vitamins you take with your health care professional. °What side effects may I notice from receiving this medicine? °Side effects that you should report to your doctor or health care professional as soon as possible: °-allergic reactions like skin rash, itching or hives, swelling of the face, lips, or tongue °-seizures °-signs and symptoms of a blood clot such as breathing problems; changes in vision; chest pain; severe, sudden headache; pain, swelling, warmth in the leg; trouble speaking; sudden numbness or weakness of the face, arm or leg °-signs and symptoms of a stroke   like changes in vision; confusion; trouble speaking or understanding; severe headaches; sudden numbness or weakness of the face, arm or leg; trouble walking; dizziness; loss of balance or coordination °Side effects that usually do not require medical attention (report to your doctor or health care professional if they continue or are  bothersome): °-chills °-cough °-dizziness °-fever °-headaches °-joint pain °-muscle cramps °-muscle pain °-nausea, vomiting °-pain, redness, or irritation at site where injected °This list may not describe all possible side effects. Call your doctor for medical advice about side effects. You may report side effects to FDA at 1-800-FDA-1088. °Where should I keep my medicine? °Keep out of the reach of children. °Store in a refrigerator between 2 and 8 degrees C (36 and 46 degrees F). Do not freeze or shake. Throw away any unused portion if using a single-dose vial. Multi-dose vials can be kept in the refrigerator for up to 21 days after the initial dose. Throw away unused medicine. °NOTE: This sheet is a summary. It may not cover all possible information. If you have questions about this medicine, talk to your doctor, pharmacist, or health care provider. °© 2019 [employer]vier/Gold Standard (2016-10-02 08:35:19) ° °

## 2018-04-12 ENCOUNTER — Encounter (HOSPITAL_COMMUNITY)
Admission: RE | Admit: 2018-04-12 | Discharge: 2018-04-12 | Disposition: A | Discharge status: Home / Self Care | Payer: Medicare Other | Source: Ambulatory Visit | Attending: Nephrology | Admitting: Nephrology

## 2018-04-12 ENCOUNTER — Encounter (HOSPITAL_COMMUNITY): Payer: Self-pay

## 2018-04-12 DIAGNOSIS — D631 Anemia in chronic kidney disease: Secondary | ICD-10-CM | POA: Diagnosis not present

## 2018-04-12 DIAGNOSIS — N185 Chronic kidney disease, stage 5: Secondary | ICD-10-CM | POA: Insufficient documentation

## 2018-04-12 MED ORDER — EPOETIN ALFA 4000 UNIT/ML IJ SOLN
4000.0000 [IU] | Freq: Once | INTRAMUSCULAR | Status: AC
  Administered 2018-04-12: 4000 [IU] via SUBCUTANEOUS

## 2018-04-12 MED ORDER — EPOETIN ALFA 4000 UNIT/ML IJ SOLN
INTRAMUSCULAR | Status: AC
  Filled 2018-04-12: qty 1

## 2018-04-13 ENCOUNTER — Encounter: Payer: Medicare Other | Admitting: Vascular Surgery

## 2018-04-13 ENCOUNTER — Encounter: Payer: Self-pay | Admitting: Vascular Surgery

## 2018-04-13 ENCOUNTER — Encounter (HOSPITAL_COMMUNITY): Payer: Medicare Other

## 2018-04-13 LAB — POCT HEMOGLOBIN-HEMACUE: Hemoglobin: 9 g/dL — ABNORMAL LOW (ref 12.0–15.0)

## 2018-04-26 ENCOUNTER — Encounter (HOSPITAL_COMMUNITY)
Admission: RE | Admit: 2018-04-26 | Discharge: 2018-04-26 | Disposition: A | Discharge status: Home / Self Care | Payer: Medicare Other | Source: Ambulatory Visit | Attending: Nephrology | Admitting: Nephrology

## 2018-04-26 ENCOUNTER — Encounter (HOSPITAL_COMMUNITY): Payer: Self-pay

## 2018-04-26 DIAGNOSIS — N185 Chronic kidney disease, stage 5: Secondary | ICD-10-CM | POA: Diagnosis not present

## 2018-04-26 LAB — POCT HEMOGLOBIN-HEMACUE: Hemoglobin: 8.2 g/dL — ABNORMAL LOW (ref 12.0–15.0)

## 2018-04-26 MED ORDER — EPOETIN ALFA 4000 UNIT/ML IJ SOLN
4000.0000 [IU] | Freq: Once | INTRAMUSCULAR | Status: AC
  Administered 2018-04-26: 4000 [IU] via SUBCUTANEOUS
  Filled 2018-04-26: qty 1

## 2018-04-28 ENCOUNTER — Other Ambulatory Visit (HOSPITAL_COMMUNITY): Payer: Medicare Other

## 2018-05-04 ENCOUNTER — Other Ambulatory Visit: Payer: Self-pay

## 2018-05-04 ENCOUNTER — Ambulatory Visit (INDEPENDENT_AMBULATORY_CARE_PROVIDER_SITE_OTHER): Payer: Medicare Other | Admitting: Vascular Surgery

## 2018-05-04 ENCOUNTER — Encounter: Payer: Self-pay | Admitting: Vascular Surgery

## 2018-05-04 ENCOUNTER — Ambulatory Visit (HOSPITAL_COMMUNITY)
Admission: RE | Admit: 2018-05-04 | Discharge: 2018-05-04 | Disposition: A | Discharge status: Home / Self Care | Payer: Medicare Other | Source: Ambulatory Visit | Attending: Vascular Surgery | Admitting: Vascular Surgery

## 2018-05-04 VITALS — BP 160/86 | HR 64 | Resp 18 | Ht 63.0 in | Wt 179.1 lb

## 2018-05-04 DIAGNOSIS — N184 Chronic kidney disease, stage 4 (severe): Secondary | ICD-10-CM | POA: Insufficient documentation

## 2018-05-04 NOTE — Progress Notes (Signed)
Patient name: Carly Wood MRN: [id number] DOB: [birth year] Sex: female  REASON FOR VISIT:   Follow-up after superficialization of left brachiocephalic AV fistula.   HPI:   Carly Wood is a pleasant 83 y.o. female this patient had a left brachiocephalic fistula that was too deep.  The patient was set up for superficialization.  This was done on 02/14/2018.  She comes in for a follow-up visit.  She has no specific complaints.  She denies pain or paresthesias in her left arm.  Current Outpatient Medications  Medication Sig Dispense Refill  . acetaminophen (TYLENOL) 500 MG tablet Take 500 mg by mouth every 6 (six) hours as needed.     . Alcohol Swabs (ALCOHOL PREP) 70 % PADS See admin instructions.  12  . amLODipine (NORVASC) 10 MG tablet Take 10 mg by mouth daily.  3  . anti-nausea (EMETROL) solution Take 10 mLs by mouth every 15 (fifteen) minutes as needed for nausea.     Marland Kitchen AQUALANCE LANCETS 30G MISC USE TO test twice daily  99  . aspirin EC 81 MG tablet Take 81 mg by mouth daily.    . calcitRIOL (ROCALTROL) 0.25 MCG capsule Take 0.25 mcg by mouth daily at 12 noon.     . calcium-vitamin D (OSCAL 500/200 D-3) 500-200 MG-UNIT tablet Take 1 tablet by mouth daily at 12 noon.     . cloNIDine (CATAPRES) 0.3 MG tablet Take 0.3 mg by mouth 2 (two) times daily.     Marland Kitchen epoetin alfa (EPOGEN,PROCRIT) 4000 UNIT/ML injection Inject 4,000 Units into the vein.    Marland Kitchen HYDROcodone-acetaminophen (NORCO) 5-325 MG tablet Take 1 tablet by mouth every 6 (six) hours as needed for moderate pain. 10 tablet 0  . Insulin Glargine (TOUJEO MAX SOLOSTAR) 300 UNIT/ML SOPN Inject 5 Units into the skin every evening.    . iron polysaccharides (NIFEREX) 150 MG capsule Take 150 mg by mouth daily at 12 noon.     Elmore Guise Devices (ADJUSTABLE LANCING DEVICE) MISC TO check blood glucose  1  . ondansetron (ZOFRAN-ODT) 4 MG disintegrating tablet Take 4 mg by mouth every 6 (six) hours as needed. for nausea  0  . ONGLYZA 5 MG  TABS tablet Take 5 mg by mouth daily.  11  . oxyCODONE (ROXICODONE) 5 MG immediate release tablet Take 1 tablet (5 mg total) by mouth every 4 (four) hours as needed. 15 tablet 0  . PARoxetine (PAXIL) 20 MG tablet Take 20 mg by mouth daily.     . pioglitazone (ACTOS) 30 MG tablet Take 15 mg by mouth daily.     . pravastatin (PRAVACHOL) 20 MG tablet Take 20 mg by mouth every evening.     . prochlorperazine (COMPAZINE) 10 MG tablet Take 1 tablet (10 mg total) by mouth every 6 (six) hours as needed for nausea or vomiting. 30 tablet 2  . traZODone (DESYREL) 100 MG tablet Take 100 mg by mouth at bedtime.  2  . Neratinib Maleate (NERLYNX) 40 MG tablet Take 6 tablets (240 mg total) by mouth daily. Take with food. (Patient not taking: Reported on 05/04/2018) 180 tablet 0   No current facility-administered medications for this visit.     REVIEW OF SYSTEMS:  [X]  denotes positive finding, [ ]  denotes negative finding Vascular    Leg swelling    Cardiac    Chest pain or chest pressure:    Shortness of breath upon exertion:    Short of breath when lying flat:  Irregular heart rhythm:    Constitutional    Fever or chills:     PHYSICAL EXAM:   Vitals:   05/04/18 1551  BP: (!) 160/86  Pulse: 64  Resp: 18  SpO2: 96%  Weight: 179 lb 2 oz (81.3 kg)  Height: 5\' 3"  (1.6 m)    GENERAL: The patient is a well-nourished female, in no acute distress. The vital signs are documented above. CARDIOVASCULAR: There is a regular rate and rhythm. PULMONARY: There is good air exchange bilaterally without wheezing or rales. VASCULAR: She has a good thrill in her left upper arm fistula.  Is slightly pulsatile.  She has a palpable brachial pulse.  DATA:   DUPLEX AV FISTULA: I have independently interpreted the duplex of her left upper arm fistula.  At the antecubital level the vein measures 0.82 cm in diameter.  In the mid upper arm it is narrowed down to 0.49 to 0.58 cm.  At the shoulder it narrows down to  0.31.  There are couple competing branches.  MEDICAL ISSUES:   STATUS POST SUPERFICIALIZATION OF LEFT BRACHIOCEPHALIC FISTULA: The fistula is now superficial enough that it should be able to be accessed fairly easily.  However it is still slightly small in the upper arm near the shoulder.  She is not yet on dialysis.  I ordered a follow-up duplex scan in 2 months.  If the vein continues to enlarge and I think will be ready for access.  If not I think we will need to consider a fistulogram with limited contrast to look for stenosis at the level of the shoulder which might be compromising maturation of the fistula.  I will see her back in 2 months.  She knows to call sooner if she has problems.  Deitra Mayo [facility] [phone]

## 2018-05-05 ENCOUNTER — Inpatient Hospital Stay (HOSPITAL_COMMUNITY): Payer: Medicare Other | Admitting: Internal Medicine

## 2018-05-05 ENCOUNTER — Other Ambulatory Visit: Payer: Self-pay

## 2018-05-05 ENCOUNTER — Inpatient Hospital Stay (HOSPITAL_COMMUNITY): Payer: Medicare Other | Attending: Hematology

## 2018-05-05 ENCOUNTER — Ambulatory Visit (HOSPITAL_COMMUNITY): Payer: Medicare Other | Admitting: Internal Medicine

## 2018-05-05 ENCOUNTER — Encounter (HOSPITAL_COMMUNITY): Payer: Self-pay | Admitting: Internal Medicine

## 2018-05-05 VITALS — BP 152/60 | HR 71 | Temp 97.6°F | Resp 16 | Wt 188.3 lb

## 2018-05-05 DIAGNOSIS — C50919 Malignant neoplasm of unspecified site of unspecified female breast: Secondary | ICD-10-CM

## 2018-05-05 DIAGNOSIS — D472 Monoclonal gammopathy: Secondary | ICD-10-CM | POA: Diagnosis present

## 2018-05-05 DIAGNOSIS — C50911 Malignant neoplasm of unspecified site of right female breast: Secondary | ICD-10-CM | POA: Insufficient documentation

## 2018-05-05 DIAGNOSIS — Z1731 Human epidermal growth factor receptor 2 positive status: Secondary | ICD-10-CM

## 2018-05-05 LAB — COMPREHENSIVE METABOLIC PANEL
ALT: 12 U/L (ref 0–44)
AST: 19 U/L (ref 15–41)
Albumin: 3.4 g/dL — ABNORMAL LOW (ref 3.5–5.0)
Alkaline Phosphatase: 59 U/L (ref 38–126)
Anion gap: 9 (ref 5–15)
BUN: 36 mg/dL — AB (ref 8–23)
CO2: 24 mmol/L (ref 22–32)
CREATININE: 4.19 mg/dL — AB (ref 0.44–1.00)
Calcium: 8.6 mg/dL — ABNORMAL LOW (ref 8.9–10.3)
Chloride: 104 mmol/L (ref 98–111)
GFR calc Af Amer: 11 mL/min — ABNORMAL LOW (ref >60)
GFR, EST NON AFRICAN AMERICAN: 9 mL/min — AB (ref >60)
Glucose, Bld: 170 mg/dL — ABNORMAL HIGH (ref 70–99)
Potassium: 4.4 mmol/L (ref 3.5–5.1)
Sodium: 137 mmol/L (ref 135–145)
Total Bilirubin: 0.3 mg/dL (ref 0.3–1.2)
Total Protein: 7.6 g/dL (ref 6.5–8.1)

## 2018-05-05 LAB — CBC WITH DIFFERENTIAL/PLATELET
Abs Immature Granulocytes: 0.02 10*3/uL (ref 0.00–0.07)
BASOS PCT: 0 %
Basophils Absolute: 0 10*3/uL (ref 0.0–0.1)
Eosinophils Absolute: 0.2 10*3/uL (ref 0.0–0.5)
Eosinophils Relative: 3 %
HCT: 27.8 % — ABNORMAL LOW (ref 36.0–46.0)
Hemoglobin: 8.5 g/dL — ABNORMAL LOW (ref 12.0–15.0)
Immature Granulocytes: 0 %
LYMPHS PCT: 19 %
Lymphs Abs: 1.2 10*3/uL (ref 0.7–4.0)
MCH: 29.2 pg (ref 26.0–34.0)
MCHC: 30.6 g/dL (ref 30.0–36.0)
MCV: 95.5 fL (ref 80.0–100.0)
MONOS PCT: 6 %
Monocytes Absolute: 0.4 10*3/uL (ref 0.1–1.0)
Neutro Abs: 4.4 10*3/uL (ref 1.7–7.7)
Neutrophils Relative %: 72 %
Platelets: 249 10*3/uL (ref 150–400)
RBC: 2.91 MIL/uL — ABNORMAL LOW (ref 3.87–5.11)
RDW: 16.1 % — ABNORMAL HIGH (ref 11.5–15.5)
WBC: 6.2 10*3/uL (ref 4.0–10.5)
nRBC: 0 % (ref 0.0–0.2)

## 2018-05-05 LAB — LACTATE DEHYDROGENASE: LDH: 186 U/L (ref 98–192)

## 2018-05-05 LAB — FERRITIN: Ferritin: 33 ng/mL (ref 11–307)

## 2018-05-06 LAB — KAPPA/LAMBDA LIGHT CHAINS
Kappa free light chain: 121.9 mg/L — ABNORMAL HIGH (ref 3.3–19.4)
Kappa, lambda light chain ratio: 0.12 — ABNORMAL LOW (ref 0.26–1.65)
Lambda free light chains: 986.3 mg/L — ABNORMAL HIGH (ref 5.7–26.3)

## 2018-05-06 LAB — IGG, IGA, IGM
IgA: 1330 mg/dL — ABNORMAL HIGH (ref 64–422)
IgG (Immunoglobin G), Serum: 1079 mg/dL (ref 700–1600)
IgM (Immunoglobulin M), Srm: 12 mg/dL — ABNORMAL LOW (ref 26–217)

## 2018-05-06 LAB — BETA 2 MICROGLOBULIN, SERUM: Beta-2 Microglobulin: 10.9 mg/L — ABNORMAL HIGH (ref 0.6–2.4)

## 2018-05-06 MED ORDER — HEPARIN SOD (PORK) LOCK FLUSH 100 UNIT/ML IV SOLN
INTRAVENOUS | Status: AC
  Filled 2018-05-06: qty 5

## 2018-05-06 MED ORDER — OCTREOTIDE ACETATE 30 MG IM KIT
PACK | INTRAMUSCULAR | Status: AC
  Filled 2018-05-06: qty 1

## 2018-05-09 LAB — PROTEIN ELECTROPHORESIS, SERUM
A/G Ratio: 0.9 (ref 0.7–1.7)
ALPHA-2-GLOBULIN: 0.8 g/dL (ref 0.4–1.0)
Albumin ELP: 3.5 g/dL (ref 2.9–4.4)
Alpha-1-Globulin: 0.2 g/dL (ref 0.0–0.4)
Beta Globulin: 1.7 g/dL — ABNORMAL HIGH (ref 0.7–1.3)
Gamma Globulin: 0.9 g/dL (ref 0.4–1.8)
Globulin, Total: 3.7 g/dL (ref 2.2–3.9)
M-Spike, %: 0.8 g/dL — ABNORMAL HIGH
Total Protein ELP: 7.2 g/dL (ref 6.0–8.5)

## 2018-05-10 ENCOUNTER — Encounter (HOSPITAL_COMMUNITY)
Admission: RE | Admit: 2018-05-10 | Discharge: 2018-05-10 | Disposition: A | Discharge status: Home / Self Care | Payer: Medicare Other | Source: Ambulatory Visit | Attending: Nephrology | Admitting: Nephrology

## 2018-05-10 ENCOUNTER — Other Ambulatory Visit: Payer: Self-pay

## 2018-05-10 DIAGNOSIS — D631 Anemia in chronic kidney disease: Secondary | ICD-10-CM | POA: Insufficient documentation

## 2018-05-10 DIAGNOSIS — N185 Chronic kidney disease, stage 5: Secondary | ICD-10-CM | POA: Diagnosis present

## 2018-05-10 LAB — POCT HEMOGLOBIN-HEMACUE: Hemoglobin: 8.5 g/dL — ABNORMAL LOW (ref 12.0–15.0)

## 2018-05-10 MED ORDER — EPOETIN ALFA 4000 UNIT/ML IJ SOLN
INTRAMUSCULAR | Status: AC
  Filled 2018-05-10: qty 1

## 2018-05-10 MED ORDER — EPOETIN ALFA 4000 UNIT/ML IJ SOLN
4000.0000 [IU] | Freq: Once | INTRAMUSCULAR | Status: AC
  Administered 2018-05-10: 4000 [IU] via SUBCUTANEOUS

## 2018-05-12 ENCOUNTER — Other Ambulatory Visit (HOSPITAL_COMMUNITY): Payer: Self-pay | Admitting: *Deleted

## 2018-05-12 MED ORDER — ANASTROZOLE 1 MG PO TABS: 1.0000 mg | ORAL_TABLET | Freq: Every day | ORAL | 0 refills | Status: DC

## 2018-05-13 NOTE — Progress Notes (Signed)
This encounter was created in error - please disregard.

## 2018-05-17 ENCOUNTER — Other Ambulatory Visit: Payer: Self-pay

## 2018-05-17 ENCOUNTER — Inpatient Hospital Stay (HOSPITAL_COMMUNITY): Payer: Medicare Other | Attending: Internal Medicine | Admitting: Internal Medicine

## 2018-05-17 VITALS — BP 155/63 | HR 73 | Temp 97.9°F | Resp 16 | Wt 187.0 lb

## 2018-05-17 DIAGNOSIS — E1122 Type 2 diabetes mellitus with diabetic chronic kidney disease: Secondary | ICD-10-CM | POA: Diagnosis not present

## 2018-05-17 DIAGNOSIS — C50911 Malignant neoplasm of unspecified site of right female breast: Secondary | ICD-10-CM | POA: Diagnosis not present

## 2018-05-17 DIAGNOSIS — K5900 Constipation, unspecified: Secondary | ICD-10-CM | POA: Diagnosis not present

## 2018-05-17 DIAGNOSIS — Z79811 Long term (current) use of aromatase inhibitors: Secondary | ICD-10-CM

## 2018-05-17 DIAGNOSIS — I129 Hypertensive chronic kidney disease with stage 1 through stage 4 chronic kidney disease, or unspecified chronic kidney disease: Secondary | ICD-10-CM

## 2018-05-17 DIAGNOSIS — M8580 Other specified disorders of bone density and structure, unspecified site: Secondary | ICD-10-CM

## 2018-05-17 DIAGNOSIS — D509 Iron deficiency anemia, unspecified: Secondary | ICD-10-CM

## 2018-05-17 DIAGNOSIS — N184 Chronic kidney disease, stage 4 (severe): Secondary | ICD-10-CM

## 2018-05-17 DIAGNOSIS — D472 Monoclonal gammopathy: Secondary | ICD-10-CM

## 2018-05-17 DIAGNOSIS — D500 Iron deficiency anemia secondary to blood loss (chronic): Secondary | ICD-10-CM

## 2018-05-17 DIAGNOSIS — Z170 Estrogen receptor positive status [ER+]: Secondary | ICD-10-CM | POA: Diagnosis not present

## 2018-05-17 NOTE — Progress Notes (Signed)
Diagnosis Iron deficiency anemia due to chronic blood loss - Plan: MM 3D SCREEN BREAST UNI LEFT, CBC with Differential, Comprehensive metabolic panel, Lactate dehydrogenase, Ferritin, IgG, IgA, IgM, Kappa/lambda light chains, Protein electrophoresis, serum  Monoclonal gammopathy - Plan: MM 3D SCREEN BREAST UNI LEFT, CBC with Differential, Comprehensive metabolic panel, Lactate dehydrogenase, Ferritin, IgG, IgA, IgM, Kappa/lambda light chains, Protein electrophoresis, serum  Staging Cancer Staging Stage 1 infiltrating ductal carcinoma of right female breast [facility]) Staging form: Breast, AJCC 7th Edition - Clinical stage from 08/30/2015: Stage IA (T1c, N0, M0) - Signed by Baird Cancer, PA-C on 08/30/2015   Assessment and Plan:   1. Stage I (T1CN0M0) invasive ductal carcinoma of the right breast, ER/PR+, HER2+.  Pt is S/P right mastectomy on 10/24/2014, 1.1 cm, followed by adjuvant chemotherapy consisting of weekly Taxol/Herceptin from 12/28/2014. Herceptin x 52 weeks completed on 11/22/2015.  Anastrozole started on 03/11/2015.  She was also started on extended adjuvant therapy with neratinib on 06/08/2016.  She was seen by Dr. Worthy Keeler 06/2017  And recommended Neratinib be discontinued as she had completed 1 year of therapy.    Pt has left screening mammogram done 08/18/2017 that was negative.  She is recommended for left screening mammogram in 08/2018.    Bone density done 06/03/2017 showed evidence of osteopenia.  Continue Arimidex and pt is planned for 5-10 years of therapy.  Continue Calcium and vitamin D.  She will RTC in 08/2018 with mammogram results and labs.    2.  IgA lambda MGUS.  Pt was reportedly recommended for bone marrow biopsy by Dr. Talbert Cage but refused.   Labs done 05/05/2018 reviewed and showed WBC 6.2 HB 8.5 plts 249,000.  Chemistries WNL with K+ 4.4, Calcium 8.6, normal LFTs.  SPEP shows stable M-spike of 0.8 g/dl.  IGA 1330.  FLC ratio is 0.12.  Cr stable at 4.   She was reportedly  planned for dialysis but has not started dialysis.  Last skeletal survey was done 05/2016 and showed question of Paget's disease.  I have discussed with them options for ongoing monitoring.  She will have repeat labs done 08/2018.   Follow-up with nephrology as recommended.    3.  Iron deficiency anemia.  Pt had HB 8.5 on labs done 05/05/2018.  Ferritin low normal at 33.  Pt was last treated with [facility] 05/2017.  Due to IDA, pt recommended for Feraheme 510 mg IV D1 and D8.  She will have repeat labs in 08/2018.   4.  CKD.  Cr stable at 4.  Pt reportedly planned for dialysis.  Follow-up with nephrology as recommended.  Pt receives procrit with nephrology.    5.  Osteopenia.  Dexa done 06/03/2017 shows osteopenia.  Pt should continue Calcium and vitamin D.  Repeat BMD in 05/2019.    6.  HTN.  BP is 155/63.  Follow-up with PCP.    7.  DM.  Follow-up with PCP.    8.  Constipation.  Stool softeners recommended.    9.  Question regarding flu vaccine and coronavirus concerns.  I advised pt and family  to follow-up with PCP as usual flu vaccine administered in fall.  Pt should also discuss options for administration with PCP.  25 minutes spent with more than 50% spent in counseling and coordination of care.    Interval History:  Historical data obtained from the note dated 06/30/2017.  1.  Stage I (T1CN0M0) invasive ductal carcinoma of the right breast, ER/PR+, HER2+.  Pt is S/P  right mastectomy on 10/24/2014, 1.1 cm, followed by adjuvant chemotherapy consisting of weekly Taxol/Herceptin from 12/28/2014. Herceptin x 52 weeks completed on 11/22/2015.  Anastrozole started on 03/11/2015.  She was also started on extended adjuvant therapy with neratinib on 06/08/2016.  2.  IgA lambda MGUS.  Pt was previously recommended for bone marrow biopsy by Dr. Talbert Cage but refused to have procedure.     Current Status:  Pt is seen today for follow-up.  She is accompanied by family members.  She is here to go over labs.       Stage 1 infiltrating ductal carcinoma of right female breast ([place])   09/12/2014 Mammogram    Mass in upper outer R breast, middle third depth appears slightly larger than it was on prior exam with more irreg spiculated margins    10/02/2014 Pathology Results    biopsy with invasive ductal carcinoma high grade 1.1 cm, dcis solid type. additional R breast tissue, excision, invasive ductal high grade 0.9 cm     10/24/2014 Pathology Results    no residual invasive carcinoma, DCIS, focal, atypical ductal hyperplasia, 0/7 LN positive for metastatic carcinoma ER > 90%, PR 30%, HER 2 2+    10/24/2014 Cancer Staging    T1cN0M0    10/24/2014 Surgery    R mastectomy, T1c, N0    12/28/2014 - 11/22/2015 Chemotherapy    Taxol/herceptin weekly X 12, Herceptin every 21 days, last due in September 2017    03/11/2015 -  Anti-estrogen oral therapy    Arimidex 1 mg daily    09/12/2015 Imaging    MUGA- The left ventricular ejection fraction equals 68%.    06/08/2016 Treatment Plan Change    Started Nerlynx      Problem List Patient Active Problem List   Diagnosis Date Noted  . Iron deficiency anemia [D50.9] 05/03/2017  . Multiple gastric ulcers [K25.9] 11/10/2016  . Dysphagia [R13.10] 07/31/2016  . GERD (gastroesophageal reflux disease) [K21.9] 07/31/2016  . Abnormal weight loss [R63.4] 07/31/2016  . Chronic renal disease, stage 4, severely decreased glomerular filtration rate (GFR) between 15-29 mL/min/1.73 square meter ([place]) [N18.4] 08/22/2015  . Aromatase inhibitor use [Z79.811] 08/22/2015  . Lymphedema of arm [I89.0] 08/22/2015  . Status post right mastectomy [Z90.11] 08/22/2015  . Osteopenia determined by x-ray [M85.80] 08/22/2015  . Stage 1 infiltrating ductal carcinoma of right female breast [facility]) [C50.911] 08/21/2015    Past Medical History Past Medical History:  Diagnosis Date  . Anemia   . Anxiety   . Chronic kidney disease   . Chronic renal disease, stage 4, severely decreased  glomerular filtration rate (GFR) between 15-29 mL/min/1.73 square meter ([facility]) 08/22/2015  . Complication of anesthesia    delirious after Breast Surgery  . Dementia ([place])    mild  . Depression   . Diabetes mellitus without complication ([place])    type II  . Dyspnea    with activity  . GERD (gastroesophageal reflux disease)   . Glaucoma   . Hypertension   . Pneumonia   . Stage 1 infiltrating ductal carcinoma of right female breast ([place]) 08/21/2015   ER+ PR+ HER 2 neu + (3+) T1cN0     Past Surgical History Past Surgical History:  Procedure Laterality Date  . AV FISTULA PLACEMENT Left 11/22/2017   Procedure: ARTERIOVENOUS (AV) FISTULA CREATION LEFT ARM;  Surgeon: Elam Dutch, MD;  Location: [place];  Service: Vascular;  Laterality: Left;  . BIOPSY  08/07/2016   Procedure: BIOPSY;  Surgeon: Daneil Dolin,  MD;  [facility];  Service: Endoscopy;;  gastric ulcer biopsy  . COLONOSCOPY    . ESOPHAGOGASTRODUODENOSCOPY N/A 08/07/2016   Procedure: ESOPHAGOGASTRODUODENOSCOPY (EGD);  Surgeon: Daneil Dolin, MD;  [facility];  Service: Endoscopy;  Laterality: N/A;  1215-rescheduled to 6/1 @ 2:30pm per Tretha Sciara  . ESOPHAGOGASTRODUODENOSCOPY N/A 11/27/2016   Procedure: ESOPHAGOGASTRODUODENOSCOPY (EGD);  Surgeon: Daneil Dolin, MD;  [facility];  Service: Endoscopy;  Laterality: N/A;  8:15am  . FISTULA SUPERFICIALIZATION Left 02/14/2018   Procedure: FISTULA SUPERFICIALIZATION LEFT ARM;  Surgeon: Angelia Mould, MD;  Location: [place];  Service: Vascular;  Laterality: Left;  . FRACTURE SURGERY Right    ankle  . MALONEY DILATION N/A 08/07/2016   Procedure: Venia Minks DILATION;  Surgeon: Daneil Dolin, MD;  [facility];  Service: Endoscopy;  Laterality: N/A;  . MASTECTOMY, PARTIAL Right   . PORT-A-CATH REMOVAL Left 11/22/2017   Procedure: REMOVAL PORT-A-CATH LEFT CHEST;  Surgeon: Elam Dutch, MD;  Location[facility];  Service: Vascular;  Laterality:  Left;  . RETINAL DETACHMENT SURGERY Right     Family History Family History  Problem Relation Age of Onset  . Multiple myeloma Sister   . Brain cancer Sister   . Dementia Mother        died at 17  . Stroke Mother   . Heart failure Mother   . Diabetes Mother   . Heart disease Father   . Prostate cancer Brother   . Colon cancer Neg Hx      Social History  reports that she has never smoked. She has never used smokeless tobacco. She reports that she does not drink alcohol or use drugs.  Medications  Current Outpatient Medications:  .  ACCU-CHEK AVIVA PLUS test strip, , Disp: , Rfl:  .  acetaminophen (TYLENOL) 500 MG tablet, Take 500 mg by mouth every 6 (six) hours as needed. , Disp: , Rfl:  .  Alcohol Swabs (ALCOHOL PREP) 70 % PADS, See admin instructions., Disp: , Rfl: 12 .  amLODipine (NORVASC) 10 MG tablet, Take 10 mg by mouth daily., Disp: , Rfl: 3 .  anastrozole (ARIMIDEX) 1 MG tablet, Take 1 tablet (1 mg total) by mouth daily., Disp: 30 tablet, Rfl: 0 .  anti-nausea (EMETROL) solution, Take 10 mLs by mouth every 15 (fifteen) minutes as needed for nausea. , Disp: , Rfl:  .  AQUALANCE LANCETS 30G MISC, USE TO test twice daily, Disp: , Rfl: 99 .  aspirin EC 81 MG tablet, Take 81 mg by mouth daily., Disp: , Rfl:  .  calcitRIOL (ROCALTROL) 0.25 MCG capsule, Take 0.25 mcg by mouth daily at 12 noon. , Disp: , Rfl:  .  calcium-vitamin D (OSCAL 500/200 D-3) 500-200 MG-UNIT tablet, Take 1 tablet by mouth daily at 12 noon. , Disp: , Rfl:  .  cloNIDine (CATAPRES) 0.3 MG tablet, Take 0.3 mg by mouth 2 (two) times daily. , Disp: , Rfl:  .  epoetin alfa (EPOGEN,PROCRIT) 4000 UNIT/ML injection, Inject 4,000 Units into the vein., Disp: , Rfl:  .  iron polysaccharides (NIFEREX) 150 MG capsule, Take 150 mg by mouth daily at 12 noon. , Disp: , Rfl:  .  Lancet Devices (ADJUSTABLE LANCING DEVICE) MISC, TO check blood glucose, Disp: , Rfl: 1 .  ONGLYZA 5 MG TABS tablet, Take 5 mg by mouth daily.,  Disp: , Rfl: 11 .  PARoxetine (PAXIL) 20 MG tablet, Take 20 mg by mouth daily. ,  Disp: , Rfl:  .  pioglitazone (ACTOS) 30 MG tablet, Take 15 mg by mouth daily. , Disp: , Rfl:  .  pravastatin (PRAVACHOL) 20 MG tablet, Take 20 mg by mouth every evening. , Disp: , Rfl:  .  TOUJEO SOLOSTAR 300 UNIT/ML SOPN, INJECT FIVE UNITS INTO THE SKIN DAILY (270 DAY SUPPLY), Disp: , Rfl:  .  traZODone (DESYREL) 100 MG tablet, Take 100 mg by mouth at bedtime., Disp: , Rfl: 2 .  HYDROcodone-acetaminophen (NORCO) 5-325 MG tablet, Take 1 tablet by mouth every 6 (six) hours as needed for moderate pain. (Patient not taking: Reported on 05/05/2018), Disp: 10 tablet, Rfl: 0 .  prochlorperazine (COMPAZINE) 10 MG tablet, Take 1 tablet (10 mg total) by mouth every 6 (six) hours as needed for nausea or vomiting. (Patient not taking: Reported on 05/05/2018), Disp: 30 tablet, Rfl: 2  Allergies Penicillins; Ace inhibitors; and Lisinopril  Review of Systems Review of Systems - Oncology ROS negative   Physical Exam  Vitals Wt Readings from Last 3 Encounters:  05/17/18 187 lb (84.8 kg)  05/05/18 188 lb 4.8 oz (85.4 kg)  05/04/18 179 lb 2 oz (81.3 kg)   Temp Readings from Last 3 Encounters:  05/17/18 97.9 F (36.6 C) (Oral)  05/05/18 97.6 F (36.4 C) (Oral)  04/12/18 (!) 97.5 F (36.4 C) (Oral)   BP Readings from Last 3 Encounters:  05/17/18 (!) 155/63  05/05/18 (!) 152/60  05/04/18 (!) 160/86   Pulse Readings from Last 3 Encounters:  05/17/18 73  05/05/18 71  05/04/18 64   Constitutional: Well-developed, well-nourished, and in no distress.   HENT: Head: Normocephalic and atraumatic.  Mouth/Throat: No oropharyngeal exudate. Mucosa moist. Eyes: Pupils are equal, round, and reactive to light. Conjunctivae are normal. No scleral icterus.  Neck: Normal range of motion. Neck supple. No JVD present.  Cardiovascular: Normal rate, regular rhythm and normal heart sounds.  Exam reveals no gallop and no friction rub.    No murmur heard. Pulmonary/Chest: Effort normal and breath sounds normal. No respiratory distress. No wheezes.No rales.  Abdominal: Soft. Bowel sounds are normal. No distension. There is no tenderness. There is no guarding.  Musculoskeletal: No edema or tenderness.  Lymphadenopathy: No cervical, axillary or supraclavicular adenopathy.  Neurological: Alert and oriented to person, place, and time. No cranial nerve deficit.  Skin: Skin is warm and dry. No rash noted. No erythema. No pallor.  Psychiatric: Affect and judgment normal.  Breast exam.  Chaperone present.  Right mastectomy.  No palpable signs of chest wall recurrence.  Left breast shows no dominant masses.    Labs No visits with results within 3 Day(s) from this visit.  Latest known visit with results is:  Hospital Outpatient Visit on 05/10/2018  Component Date Value Ref Range Status  . Hemoglobin 05/10/2018 8.5* 12.0 - 15.0 g/dL Final     Pathology Orders Placed This Encounter  Procedures  . MM 3D SCREEN BREAST UNI LEFT    Standing Status:   Future    Standing Expiration Date:   07/17/2019    Order Specific Question:   Reason for Exam (SYMPTOM  OR DIAGNOSIS REQUIRED)    Answer:   right breast cancer    Order Specific Question:   Preferred imaging location?    Answer:   [facility]  . CBC with Differential    Standing Status:   Future    Standing Expiration Date:   05/17/2019  . Comprehensive metabolic panel    Standing Status:  Future    Standing Expiration Date:   05/17/2019  . Lactate dehydrogenase    Standing Status:   Future    Standing Expiration Date:   05/17/2019  . Ferritin    Standing Status:   Future    Standing Expiration Date:   05/17/2019  . IgG, IgA, IgM    Standing Status:   Future    Standing Expiration Date:   05/17/2019  . Kappa/lambda light chains    Standing Status:   Future    Standing Expiration Date:   05/17/2019  . Protein electrophoresis, serum    Standing Status:   Future     Standing Expiration Date:   05/17/2019       Zoila Shutter MD

## 2018-05-24 ENCOUNTER — Encounter (HOSPITAL_COMMUNITY): Payer: Self-pay

## 2018-05-24 ENCOUNTER — Other Ambulatory Visit: Payer: Self-pay

## 2018-05-24 ENCOUNTER — Encounter (HOSPITAL_COMMUNITY)
Admission: RE | Admit: 2018-05-24 | Discharge: 2018-05-24 | Disposition: A | Discharge status: Home / Self Care | Payer: Medicare Other | Source: Ambulatory Visit | Attending: Nephrology | Admitting: Nephrology

## 2018-05-24 DIAGNOSIS — N185 Chronic kidney disease, stage 5: Secondary | ICD-10-CM | POA: Diagnosis not present

## 2018-05-24 LAB — POCT HEMOGLOBIN-HEMACUE: Hemoglobin: 8 g/dL — ABNORMAL LOW (ref 12.0–15.0)

## 2018-05-24 MED ORDER — EPOETIN ALFA 4000 UNIT/ML IJ SOLN
INTRAMUSCULAR | Status: AC
  Filled 2018-05-24: qty 1

## 2018-05-24 MED ORDER — EPOETIN ALFA 4000 UNIT/ML IJ SOLN
4000.0000 [IU] | Freq: Once | INTRAMUSCULAR | Status: AC
  Administered 2018-05-24: 4000 [IU] via SUBCUTANEOUS

## 2018-05-26 ENCOUNTER — Ambulatory Visit (HOSPITAL_COMMUNITY): Payer: Medicare Other

## 2018-06-03 ENCOUNTER — Ambulatory Visit (HOSPITAL_COMMUNITY): Payer: Medicare Other

## 2018-06-07 ENCOUNTER — Encounter (HOSPITAL_COMMUNITY): Admission: RE | Admit: 2018-06-07 | Payer: Medicare Other | Source: Ambulatory Visit

## 2018-06-07 ENCOUNTER — Encounter (HOSPITAL_COMMUNITY): Payer: Medicare Other

## 2018-06-13 ENCOUNTER — Other Ambulatory Visit (HOSPITAL_COMMUNITY): Payer: Self-pay | Admitting: Internal Medicine

## 2018-06-13 NOTE — Telephone Encounter (Signed)
Carly [facility] patient refill request.    

## 2018-06-14 ENCOUNTER — Other Ambulatory Visit: Payer: Self-pay

## 2018-06-14 DIAGNOSIS — N184 Chronic kidney disease, stage 4 (severe): Secondary | ICD-10-CM

## 2018-06-29 ENCOUNTER — Ambulatory Visit: Payer: Medicare Other | Admitting: Vascular Surgery

## 2018-06-29 ENCOUNTER — Ambulatory Visit (HOSPITAL_COMMUNITY): Payer: Medicare Other | Attending: Family

## 2018-07-01 ENCOUNTER — Encounter (HOSPITAL_COMMUNITY): Payer: Medicare Other

## 2018-07-01 ENCOUNTER — Encounter (HOSPITAL_COMMUNITY)
Admission: RE | Admit: 2018-07-01 | Discharge: 2018-07-01 | Disposition: A | Discharge status: Home / Self Care | Payer: Medicare Other | Source: Ambulatory Visit | Attending: Nephrology | Admitting: Nephrology

## 2018-07-01 ENCOUNTER — Encounter (HOSPITAL_COMMUNITY): Admission: RE | Admit: 2018-07-01 | Payer: Medicare Other | Source: Ambulatory Visit

## 2018-07-01 ENCOUNTER — Other Ambulatory Visit: Payer: Self-pay

## 2018-07-01 DIAGNOSIS — D631 Anemia in chronic kidney disease: Secondary | ICD-10-CM | POA: Insufficient documentation

## 2018-07-01 DIAGNOSIS — N185 Chronic kidney disease, stage 5: Secondary | ICD-10-CM | POA: Diagnosis present

## 2018-07-01 LAB — POCT HEMOGLOBIN-HEMACUE: Hemoglobin: 8.4 g/dL — ABNORMAL LOW (ref 12.0–15.0)

## 2018-07-01 MED ORDER — EPOETIN ALFA 2000 UNIT/ML IJ SOLN: 4000.0000 [IU] | Freq: Once | INTRAMUSCULAR | Status: DC

## 2018-07-01 MED ORDER — EPOETIN ALFA 4000 UNIT/ML IJ SOLN
4000.0000 [IU] | Freq: Once | INTRAMUSCULAR | Status: AC
  Administered 2018-07-01: 10:00:00 4000 [IU] via SUBCUTANEOUS

## 2018-07-01 MED ORDER — EPOETIN ALFA 4000 UNIT/ML IJ SOLN
INTRAMUSCULAR | Status: AC
  Filled 2018-07-01: qty 1

## 2018-07-01 NOTE — Progress Notes (Signed)
Results for HSER, BELANGER (MRN [id number]) as of 07/01/2018 12:08  Ref. Range 07/01/2018 10:06  Hemoglobin Latest Ref Range: 12.0 - 15.0 g/dL 8.4 (L)

## 2018-07-14 ENCOUNTER — Other Ambulatory Visit (HOSPITAL_COMMUNITY): Payer: Self-pay | Admitting: Hematology

## 2018-07-15 ENCOUNTER — Encounter (HOSPITAL_COMMUNITY)
Admission: RE | Admit: 2018-07-15 | Discharge: 2018-07-15 | Disposition: A | Discharge status: Home / Self Care | Payer: Medicare Other | Source: Ambulatory Visit | Attending: Nephrology | Admitting: Nephrology

## 2018-07-15 ENCOUNTER — Other Ambulatory Visit: Payer: Self-pay

## 2018-07-15 ENCOUNTER — Encounter (HOSPITAL_COMMUNITY): Payer: Self-pay

## 2018-07-15 DIAGNOSIS — N185 Chronic kidney disease, stage 5: Secondary | ICD-10-CM | POA: Diagnosis not present

## 2018-07-15 DIAGNOSIS — D631 Anemia in chronic kidney disease: Secondary | ICD-10-CM | POA: Insufficient documentation

## 2018-07-15 LAB — POCT HEMOGLOBIN-HEMACUE: Hemoglobin: 8.3 g/dL — ABNORMAL LOW (ref 12.0–15.0)

## 2018-07-15 MED ORDER — EPOETIN ALFA 4000 UNIT/ML IJ SOLN
4000.0000 [IU] | Freq: Once | INTRAMUSCULAR | Status: AC
  Administered 2018-07-15: 4000 [IU] via SUBCUTANEOUS
  Filled 2018-07-15: qty 1

## 2018-07-29 ENCOUNTER — Encounter (HOSPITAL_COMMUNITY)
Admission: RE | Admit: 2018-07-29 | Discharge: 2018-07-29 | Disposition: A | Discharge status: Home / Self Care | Payer: Medicare Other | Source: Ambulatory Visit | Attending: Nephrology | Admitting: Nephrology

## 2018-07-29 ENCOUNTER — Other Ambulatory Visit: Payer: Self-pay

## 2018-07-29 DIAGNOSIS — N185 Chronic kidney disease, stage 5: Secondary | ICD-10-CM | POA: Diagnosis not present

## 2018-07-29 LAB — HEMOGLOBIN AND HEMATOCRIT, BLOOD
HCT: 25.4 % — ABNORMAL LOW (ref 36.0–46.0)
Hemoglobin: 8.1 g/dL — ABNORMAL LOW (ref 12.0–15.0)

## 2018-07-29 LAB — RENAL FUNCTION PANEL
Albumin: 3.5 g/dL (ref 3.5–5.0)
Anion gap: 12 (ref 5–15)
BUN: 43 mg/dL — ABNORMAL HIGH (ref 8–23)
CO2: 23 mmol/L (ref 22–32)
Calcium: 8.8 mg/dL — ABNORMAL LOW (ref 8.9–10.3)
Chloride: 105 mmol/L (ref 98–111)
Creatinine, Ser: 5.02 mg/dL — ABNORMAL HIGH (ref 0.44–1.00)
GFR calc Af Amer: 9 mL/min — ABNORMAL LOW (ref >60)
GFR calc non Af Amer: 8 mL/min — ABNORMAL LOW (ref >60)
Glucose, Bld: 166 mg/dL — ABNORMAL HIGH (ref 70–99)
Phosphorus: 4.7 mg/dL — ABNORMAL HIGH (ref 2.5–4.6)
Potassium: 4 mmol/L (ref 3.5–5.1)
Sodium: 140 mmol/L (ref 135–145)

## 2018-07-29 LAB — IRON AND TIBC
Iron: 40 ug/dL (ref 28–170)
Saturation Ratios: 22 % (ref 10.4–31.8)
TIBC: 178 ug/dL — ABNORMAL LOW (ref 250–450)
UIBC: 138 ug/dL

## 2018-07-29 LAB — POCT HEMOGLOBIN-HEMACUE: Hemoglobin: 8.2 g/dL — ABNORMAL LOW (ref 12.0–15.0)

## 2018-07-29 LAB — FERRITIN: Ferritin: 46 ng/mL (ref 11–307)

## 2018-07-29 MED ORDER — EPOETIN ALFA 4000 UNIT/ML IJ SOLN
INTRAMUSCULAR | Status: AC
  Filled 2018-07-29: qty 1

## 2018-07-29 MED ORDER — EPOETIN ALFA 4000 UNIT/ML IJ SOLN
4000.0000 [IU] | Freq: Once | INTRAMUSCULAR | Status: AC
  Administered 2018-07-29: 4000 [IU] via SUBCUTANEOUS

## 2018-07-29 NOTE — Progress Notes (Signed)
Pt unable to give urine sample. Urine specimen cup with name label in biohazard bag, given to pt to take home. Instructed sister to return specimen to lab. Voiced understanding.

## 2018-07-30 LAB — PTH, INTACT AND CALCIUM
Calcium, Total (PTH): 9 mg/dL (ref 8.7–10.3)
PTH: 47 pg/mL (ref 15–65)

## 2018-07-30 LAB — VITAMIN D 25 HYDROXY (VIT D DEFICIENCY, FRACTURES): Vit D, 25-Hydroxy: 43.9 ng/mL (ref 30.0–100.0)

## 2018-08-12 ENCOUNTER — Encounter (HOSPITAL_COMMUNITY)
Admission: RE | Admit: 2018-08-12 | Discharge: 2018-08-12 | Disposition: A | Discharge status: Home / Self Care | Payer: Medicare Other | Source: Ambulatory Visit | Attending: Nephrology | Admitting: Nephrology

## 2018-08-12 ENCOUNTER — Other Ambulatory Visit: Payer: Self-pay

## 2018-08-12 ENCOUNTER — Encounter (HOSPITAL_COMMUNITY): Payer: Self-pay

## 2018-08-12 DIAGNOSIS — D631 Anemia in chronic kidney disease: Secondary | ICD-10-CM | POA: Diagnosis not present

## 2018-08-12 DIAGNOSIS — N185 Chronic kidney disease, stage 5: Secondary | ICD-10-CM | POA: Insufficient documentation

## 2018-08-12 LAB — PROTEIN / CREATININE RATIO, URINE: Creatinine, Urine: 69.32 mg/dL

## 2018-08-12 LAB — POCT HEMOGLOBIN-HEMACUE: Hemoglobin: 8.8 g/dL — ABNORMAL LOW (ref 12.0–15.0)

## 2018-08-12 MED ORDER — EPOETIN ALFA 4000 UNIT/ML IJ SOLN
4000.0000 [IU] | Freq: Once | INTRAMUSCULAR | Status: AC
  Administered 2018-08-12: 4000 [IU] via SUBCUTANEOUS

## 2018-08-12 MED ORDER — EPOETIN ALFA 4000 UNIT/ML IJ SOLN
INTRAMUSCULAR | Status: AC
  Filled 2018-08-12: qty 1

## 2018-08-14 ENCOUNTER — Other Ambulatory Visit (HOSPITAL_COMMUNITY): Payer: Self-pay | Admitting: Hematology

## 2018-08-15 ENCOUNTER — Other Ambulatory Visit (HOSPITAL_COMMUNITY): Payer: Self-pay | Admitting: *Deleted

## 2018-08-15 DIAGNOSIS — D500 Iron deficiency anemia secondary to blood loss (chronic): Secondary | ICD-10-CM

## 2018-08-15 DIAGNOSIS — D509 Iron deficiency anemia, unspecified: Secondary | ICD-10-CM

## 2018-08-16 ENCOUNTER — Other Ambulatory Visit (HOSPITAL_COMMUNITY): Payer: Medicare Other

## 2018-08-17 ENCOUNTER — Other Ambulatory Visit: Payer: Self-pay

## 2018-08-17 ENCOUNTER — Inpatient Hospital Stay (HOSPITAL_COMMUNITY): Payer: Medicare Other | Attending: Internal Medicine

## 2018-08-17 DIAGNOSIS — C50411 Malignant neoplasm of upper-outer quadrant of right female breast: Secondary | ICD-10-CM | POA: Diagnosis not present

## 2018-08-17 DIAGNOSIS — D472 Monoclonal gammopathy: Secondary | ICD-10-CM | POA: Insufficient documentation

## 2018-08-17 DIAGNOSIS — N184 Chronic kidney disease, stage 4 (severe): Secondary | ICD-10-CM | POA: Insufficient documentation

## 2018-08-17 DIAGNOSIS — D500 Iron deficiency anemia secondary to blood loss (chronic): Secondary | ICD-10-CM

## 2018-08-17 DIAGNOSIS — M8580 Other specified disorders of bone density and structure, unspecified site: Secondary | ICD-10-CM | POA: Diagnosis not present

## 2018-08-17 DIAGNOSIS — D509 Iron deficiency anemia, unspecified: Secondary | ICD-10-CM | POA: Diagnosis not present

## 2018-08-17 DIAGNOSIS — Z79811 Long term (current) use of aromatase inhibitors: Secondary | ICD-10-CM | POA: Insufficient documentation

## 2018-08-17 DIAGNOSIS — Z170 Estrogen receptor positive status [ER+]: Secondary | ICD-10-CM | POA: Diagnosis not present

## 2018-08-17 LAB — COMPREHENSIVE METABOLIC PANEL
ALT: 8 U/L (ref 0–44)
AST: 20 U/L (ref 15–41)
Albumin: 3.4 g/dL — ABNORMAL LOW (ref 3.5–5.0)
Alkaline Phosphatase: 53 U/L (ref 38–126)
Anion gap: 13 (ref 5–15)
BUN: 38 mg/dL — ABNORMAL HIGH (ref 8–23)
CO2: 23 mmol/L (ref 22–32)
Calcium: 9 mg/dL (ref 8.9–10.3)
Chloride: 104 mmol/L (ref 98–111)
Creatinine, Ser: 5.24 mg/dL — ABNORMAL HIGH (ref 0.44–1.00)
GFR calc Af Amer: 8 mL/min — ABNORMAL LOW (ref >60)
GFR calc non Af Amer: 7 mL/min — ABNORMAL LOW (ref >60)
Glucose, Bld: 151 mg/dL — ABNORMAL HIGH (ref 70–99)
Potassium: 4.1 mmol/L (ref 3.5–5.1)
Sodium: 140 mmol/L (ref 135–145)
Total Bilirubin: 0.2 mg/dL — ABNORMAL LOW (ref 0.3–1.2)
Total Protein: 7.4 g/dL (ref 6.5–8.1)

## 2018-08-17 LAB — CBC WITH DIFFERENTIAL/PLATELET
Abs Immature Granulocytes: 0.03 10*3/uL (ref 0.00–0.07)
Basophils Absolute: 0 10*3/uL (ref 0.0–0.1)
Basophils Relative: 0 %
Eosinophils Absolute: 0.2 10*3/uL (ref 0.0–0.5)
Eosinophils Relative: 4 %
HCT: 26.9 % — ABNORMAL LOW (ref 36.0–46.0)
Hemoglobin: 8.3 g/dL — ABNORMAL LOW (ref 12.0–15.0)
Immature Granulocytes: 1 %
Lymphocytes Relative: 13 %
Lymphs Abs: 0.7 10*3/uL (ref 0.7–4.0)
MCH: 29.7 pg (ref 26.0–34.0)
MCHC: 30.9 g/dL (ref 30.0–36.0)
MCV: 96.4 fL (ref 80.0–100.0)
Monocytes Absolute: 0.3 10*3/uL (ref 0.1–1.0)
Monocytes Relative: 6 %
Neutro Abs: 4.1 10*3/uL (ref 1.7–7.7)
Neutrophils Relative %: 76 %
Platelets: 286 10*3/uL (ref 150–400)
RBC: 2.79 MIL/uL — ABNORMAL LOW (ref 3.87–5.11)
RDW: 16.5 % — ABNORMAL HIGH (ref 11.5–15.5)
WBC: 5.4 10*3/uL (ref 4.0–10.5)
nRBC: 0 % (ref 0.0–0.2)

## 2018-08-17 LAB — FERRITIN: Ferritin: 36 ng/mL (ref 11–307)

## 2018-08-17 LAB — LACTATE DEHYDROGENASE: LDH: 156 U/L (ref 98–192)

## 2018-08-18 LAB — IGG, IGA, IGM
IgA: 1230 mg/dL — ABNORMAL HIGH (ref 64–422)
IgG (Immunoglobin G), Serum: 980 mg/dL (ref 586–1602)
IgM (Immunoglobulin M), Srm: 11 mg/dL — ABNORMAL LOW (ref 26–217)

## 2018-08-18 LAB — KAPPA/LAMBDA LIGHT CHAINS
Kappa free light chain: 131.6 mg/L — ABNORMAL HIGH (ref 3.3–19.4)
Kappa, lambda light chain ratio: 0.13 — ABNORMAL LOW (ref 0.26–1.65)
Lambda free light chains: 1029.9 mg/L — ABNORMAL HIGH (ref 5.7–26.3)

## 2018-08-18 MED ORDER — ACETAMINOPHEN 325 MG PO TABS
ORAL_TABLET | ORAL | Status: AC
  Filled 2018-08-18: qty 2

## 2018-08-18 MED ORDER — DIPHENHYDRAMINE HCL 25 MG PO CAPS
ORAL_CAPSULE | ORAL | Status: AC
  Filled 2018-08-18: qty 1

## 2018-08-23 ENCOUNTER — Other Ambulatory Visit (HOSPITAL_COMMUNITY): Payer: Medicare Other

## 2018-08-23 ENCOUNTER — Inpatient Hospital Stay (HOSPITAL_BASED_OUTPATIENT_CLINIC_OR_DEPARTMENT_OTHER): Payer: Medicare Other | Admitting: Nurse Practitioner

## 2018-08-23 ENCOUNTER — Other Ambulatory Visit: Payer: Self-pay

## 2018-08-23 VITALS — BP 147/70 | HR 72 | Temp 97.8°F | Resp 18 | Wt 174.7 lb

## 2018-08-23 DIAGNOSIS — D472 Monoclonal gammopathy: Secondary | ICD-10-CM

## 2018-08-23 DIAGNOSIS — N184 Chronic kidney disease, stage 4 (severe): Secondary | ICD-10-CM

## 2018-08-23 DIAGNOSIS — C50411 Malignant neoplasm of upper-outer quadrant of right female breast: Secondary | ICD-10-CM

## 2018-08-23 DIAGNOSIS — Z170 Estrogen receptor positive status [ER+]: Secondary | ICD-10-CM

## 2018-08-23 DIAGNOSIS — M8580 Other specified disorders of bone density and structure, unspecified site: Secondary | ICD-10-CM

## 2018-08-23 DIAGNOSIS — C50911 Malignant neoplasm of unspecified site of right female breast: Secondary | ICD-10-CM

## 2018-08-23 DIAGNOSIS — Z79811 Long term (current) use of aromatase inhibitors: Secondary | ICD-10-CM | POA: Diagnosis not present

## 2018-08-23 DIAGNOSIS — D509 Iron deficiency anemia, unspecified: Secondary | ICD-10-CM

## 2018-08-23 NOTE — Progress Notes (Signed)
[address], [place] [address]   CLINIC:  Medical Oncology/Hematology  PCP:  Monico Blitz, MD [place] [address] [phone]   REASON FOR VISIT: Follow-up for breast cancer, MGUS, and iron deficiency anemia  CURRENT THERAPY: Arimidex, Epogen injections and intermittent iron infusions  BRIEF ONCOLOGIC HISTORY:  Oncology History  Stage 1 infiltrating ductal carcinoma of right female breast ([place])  09/12/2014 Mammogram   Mass in upper outer R breast, middle third depth appears slightly larger than it was on prior exam with more irreg spiculated margins   10/02/2014 Pathology Results   biopsy with invasive ductal carcinoma high grade 1.1 cm, dcis solid type. additional R breast tissue, excision, invasive ductal high grade 0.9 cm    10/24/2014 Pathology Results   no residual invasive carcinoma, DCIS, focal, atypical ductal hyperplasia, 0/7 LN positive for metastatic carcinoma ER > 90%, PR 30%, HER 2 2+   10/24/2014 Cancer Staging   [id number]   10/24/2014 Surgery   R mastectomy, T1c, N0   12/28/2014 - 11/22/2015 Chemotherapy   Taxol/herceptin weekly X 12, Herceptin every 21 days, last due in September 2017   03/11/2015 -  Anti-estrogen oral therapy   Arimidex 1 mg daily   09/12/2015 Imaging   MUGA- The left ventricular ejection fraction equals 68%.   06/08/2016 Treatment Plan Change   Started Nerlynx      CANCER STAGING: Cancer Staging Stage 1 infiltrating ductal carcinoma of right female breast [facility]) Staging form: Breast, AJCC 7th Edition - Clinical stage from 08/30/2015: Stage IA (T1c, N0, M0) - Signed by Baird Cancer, PA-C on 08/30/2015    INTERVAL HISTORY:  Ms. Carly Wood 83 y.o. female returns for routine follow-up breast cancer and MGUS.  She reports fatigue throughout the day. She also reports having itchy skin at time. Denies any nausea, vomiting, or diarrhea. Denies any new pains. Had not noticed any recent bleeding such as  epistaxis, hematuria or hematochezia. Denies recent chest pain on exertion, shortness of breath on minimal exertion, pre-syncopal episodes, or palpitations. Denies any numbness or tingling in hands or feet. Denies any recent fevers, infections, or recent hospitalizations. Patient reports appetite at 100% and energy level at 25%.  She is eating well and maintaining her weight at this time.    REVIEW OF SYSTEMS:  Review of Systems  Constitutional: Positive for fatigue.  Gastrointestinal: Positive for constipation.  Skin: Positive for itching.  Neurological: Positive for numbness.  Psychiatric/Behavioral: Positive for sleep disturbance.  All other systems reviewed and are negative.    PAST MEDICAL/SURGICAL HISTORY:  Past Medical History:  Diagnosis Date  . Anemia   . Anxiety   . Chronic kidney disease   . Chronic renal disease, stage 4, severely decreased glomerular filtration rate (GFR) between 15-29 mL/min/1.73 square meter (HCC) 08/22/2015  . Complication of anesthesia    delirious after Breast Surgery  . Dementia ([facility])    mild  . Depression   . Diabetes mellitus without complication ([place])    type II  . Dyspnea    with activity  . GERD (gastroesophageal reflux disease)   . Glaucoma   . Hypertension   . Pneumonia   . Stage 1 infiltrating ductal carcinoma of right female breast ([place]) 08/21/2015   ER+ PR+ HER 2 neu + (3+) T1cN0    Past Surgical History:  Procedure Laterality Date  . AV FISTULA PLACEMENT Left 11/22/2017   Procedure: ARTERIOVENOUS (AV) FISTULA CREATION LEFT ARM;  Surgeon:  Elam Dutch, MD;  Location: [facility] OR;  Service: Vascular;  Laterality: Left;  . BIOPSY  08/07/2016   Procedure: BIOPSY;  Surgeon: Daneil Dolin, MD;  Location: [facility];  Service: Endoscopy;;  gastric ulcer biopsy  . COLONOSCOPY    . ESOPHAGOGASTRODUODENOSCOPY N/A 08/07/2016   Procedure: ESOPHAGOGASTRODUODENOSCOPY (EGD);  Surgeon: Daneil Dolin, MD;  Location: [facility];  Service:  Endoscopy;  Laterality: N/A;  1215-rescheduled to 6/1 @ 2:30pm per Tretha Sciara  . ESOPHAGOGASTRODUODENOSCOPY N/A 11/27/2016   Procedure: ESOPHAGOGASTRODUODENOSCOPY (EGD);  Surgeon: Daneil Dolin, MD;  [facility];  Service: Endoscopy;  Laterality: N/A;  8:15am  . FISTULA SUPERFICIALIZATION Left 02/14/2018   Procedure: FISTULA SUPERFICIALIZATION LEFT ARM;  Surgeon: Angelia Mould, MD;  Location: [facility];  Service: Vascular;  Laterality: Left;  . FRACTURE SURGERY Right    ankle  . MALONEY DILATION N/A 08/07/2016   Procedure: Venia Minks DILATION;  Surgeon: Daneil Dolin, MD;  [facility];  Service: Endoscopy;  Laterality: N/A;  . MASTECTOMY, PARTIAL Right   . PORT-A-CATH REMOVAL Left 11/22/2017   Procedure: REMOVAL PORT-A-CATH LEFT CHEST;  Surgeon: Elam Dutch, MD;  Location: [facility] OR;  Service: Vascular;  Laterality: Left;  . RETINAL DETACHMENT SURGERY Right      SOCIAL HISTORY:  Social History   Socioeconomic History  . Marital status: Single    Spouse name: Not on file  . Number of children: Not on file  . Years of education: Not on file  . Highest education level: Not on file  Occupational History  . Not on file  Social Needs  . Financial resource strain: Not on file  . Food insecurity    Worry: Not on file    Inability: Not on file  . Transportation needs    Medical: Not on file    Non-medical: Not on file  Tobacco Use  . Smoking status: Never Smoker  . Smokeless tobacco: Never Used  Substance and Sexual Activity  . Alcohol use: No    Alcohol/week: 0.0 standard drinks  . Drug use: No  . Sexual activity: Not on file  Lifestyle  . Physical activity    Days per week: Not on file    Minutes per session: Not on file  . Stress: Not on file  Relationships  . Social Herbalist on phone: Not on file    Gets together: Not on file    Attends religious service: Not on file    Active member of club or organization: Not on file    Attends  meetings of clubs or organizations: Not on file    Relationship status: Not on file  . Intimate partner violence    Fear of current or ex partner: Not on file    Emotionally abused: Not on file    Physically abused: Not on file    Forced sexual activity: Not on file  Other Topics Concern  . Not on file  Social History Narrative  . Not on file    FAMILY HISTORY:  Family History  Problem Relation Age of Onset  . Multiple myeloma Sister   . Brain cancer Sister   . Dementia Mother        died at 26  . Stroke Mother   . Heart failure Mother   . Diabetes Mother   . Heart disease Father   . Prostate cancer Brother   . Colon cancer Neg Hx  CURRENT MEDICATIONS:  Outpatient Encounter Medications as of 08/23/2018  Medication Sig  . ACCU-CHEK AVIVA PLUS test strip   . acetaminophen (TYLENOL) 500 MG tablet Take 500 mg by mouth every 6 (six) hours as needed.   . Alcohol Swabs (ALCOHOL PREP) 70 % PADS See admin instructions.  Marland Kitchen amLODipine (NORVASC) 10 MG tablet Take 10 mg by mouth daily.  Marland Kitchen anastrozole (ARIMIDEX) 1 MG tablet TAKE 1 TABLET BY MOUTH DAILY  . AQUALANCE LANCETS 30G MISC USE TO test twice daily  . aspirin EC 81 MG tablet Take 81 mg by mouth daily.  . calcitRIOL (ROCALTROL) 0.25 MCG capsule Take 0.25 mcg by mouth daily at 12 noon.   . calcium-vitamin D (OSCAL 500/200 D-3) 500-200 MG-UNIT tablet Take 1 tablet by mouth daily at 12 noon.   . cloNIDine (CATAPRES) 0.3 MG tablet Take 0.3 mg by mouth 2 (two) times daily.   Marland Kitchen epoetin alfa (EPOGEN,PROCRIT) 4000 UNIT/ML injection Inject 4,000 Units into the vein.  . iron polysaccharides (NIFEREX) 150 MG capsule Take 150 mg by mouth daily at 12 noon.   Elmore Guise Devices (ADJUSTABLE LANCING DEVICE) MISC TO check blood glucose  . ONGLYZA 5 MG TABS tablet Take 5 mg by mouth daily.  Marland Kitchen PARoxetine (PAXIL) 20 MG tablet Take 20 mg by mouth daily.   . pioglitazone (ACTOS) 30 MG tablet Take 15 mg by mouth daily.   . pravastatin (PRAVACHOL) 20  MG tablet Take 20 mg by mouth every evening.   Marland Kitchen TOUJEO SOLOSTAR 300 UNIT/ML SOPN INJECT FIVE UNITS INTO THE SKIN DAILY (270 DAY SUPPLY)  . traZODone (DESYREL) 100 MG tablet Take 100 mg by mouth at bedtime.  Marland Kitchen anti-nausea (EMETROL) solution Take 10 mLs by mouth every 15 (fifteen) minutes as needed for nausea.   Marland Kitchen HYDROcodone-acetaminophen (NORCO) 5-325 MG tablet Take 1 tablet by mouth every 6 (six) hours as needed for moderate pain. (Patient not taking: Reported on 05/05/2018)  . prochlorperazine (COMPAZINE) 10 MG tablet Take 1 tablet (10 mg total) by mouth every 6 (six) hours as needed for nausea or vomiting. (Patient not taking: Reported on 05/05/2018)   No facility-administered encounter medications on file as of 08/23/2018.     ALLERGIES:  Allergies  Allergen Reactions  . Penicillins Other (See Comments)    Unsure of reaction Has patient had a PCN reaction causing immediate rash, facial/tongue/throat swelling, SOB or lightheadedness with hypotension: Unknown Has patient had a PCN reaction causing severe rash involving mucus membranes or skin necrosis: Unknown Has patient had a PCN reaction that required hospitalization: No Has patient had a PCN reaction occurring within the last 10 years: Unknown If all of the above answers are "NO", then may proceed with Cephalosporin use.    . Ace Inhibitors Cough and Other (See Comments)    Tongue swell  . Lisinopril Other (See Comments)    Tongue swell     PHYSICAL EXAM:  ECOG Performance status: 1  Vitals:   08/23/18 1307  BP: (!) 147/70  Pulse: 72  Resp: 18  Temp: 97.8 F (36.6 C)  SpO2: 100%   Filed Weights   08/23/18 1307  Weight: 174 lb 11.2 oz (79.2 kg)    Physical Exam Constitutional:      Appearance: Normal appearance. She is normal weight.  Cardiovascular:     Rate and Rhythm: Normal rate and regular rhythm.     Heart sounds: Normal heart sounds.  Pulmonary:     Effort: Pulmonary effort is normal.  Breath sounds:  Normal breath sounds.  Abdominal:     General: Bowel sounds are normal.     Palpations: Abdomen is soft.  Musculoskeletal: Normal range of motion.  Skin:    General: Skin is warm and dry.  Neurological:     Mental Status: She is alert and oriented to person, place, and time. Mental status is at baseline.  Psychiatric:        Mood and Affect: Mood normal.        Behavior: Behavior normal.        Thought Content: Thought content normal.        Judgment: Judgment normal.      LABORATORY DATA:  I have reviewed the labs as listed.  CBC    Component Value Date/Time   WBC 5.4 08/17/2018 1142   RBC 2.79 (L) 08/17/2018 1142   HGB 8.3 (L) 08/17/2018 1142   HCT 26.9 (L) 08/17/2018 1142   PLT 286 08/17/2018 1142   MCV 96.4 08/17/2018 1142   MCH 29.7 08/17/2018 1142   MCHC 30.9 08/17/2018 1142   RDW 16.5 (H) 08/17/2018 1142   LYMPHSABS 0.7 08/17/2018 1142   MONOABS 0.3 08/17/2018 1142   EOSABS 0.2 08/17/2018 1142   BASOSABS 0.0 08/17/2018 1142   CMP Latest Ref Rng & Units 08/17/2018 07/29/2018 07/29/2018  Glucose 70 - 99 mg/dL 151(H) 166(H) -  BUN 8 - 23 mg/dL 38(H) 43(H) -  Creatinine 0.44 - 1.00 mg/dL 5.24(H) 5.02(H) -  Sodium 135 - 145 mmol/L 140 140 -  Potassium 3.5 - 5.1 mmol/L 4.1 4.0 -  Chloride 98 - 111 mmol/L 104 105 -  CO2 22 - 32 mmol/L 23 23 -  Calcium 8.9 - 10.3 mg/dL 9.0 8.8(L) 9.0  Total Protein 6.5 - 8.1 g/dL 7.4 - -  Total Bilirubin 0.3 - 1.2 mg/dL 0.2(L) - -  Alkaline Phos 38 - 126 U/L 53 - -  AST 15 - 41 U/L 20 - -  ALT 0 - 44 U/L 8 - -      I personally performed a face-to-face visit.  All questions were answered to patient's stated satisfaction. Encouraged patient to call with any new concerns or questions before his next visit to the cancer center and we can certain see him sooner, if needed.     ASSESSMENT & PLAN:   Stage 1 infiltrating ductal carcinoma of right female breast ([place]) 1.  Stage I invasive ductal carcinoma of the right breast: - ER/PR  positive, HER-2 positive. -Status post mastectomy on 10/24/2014, 1.1 cm, followed by adjuvant chemotherapy consisting of weekly Taxol/Herceptin from 12/28/2014 -Herceptin x52 weeks completed on 11/22/2015. -Anastrozole started on 03/11/2015. -She was also started on extended adjuvant therapy with neratinib on 06/08/2016, this was stopped after 1 year of therapy. - Her mastectomy site on the right side today is within normal limits.  Left breast has no palpable masses. - Last mammogram done on 08/18/2017 was B RADS category 1 negative.  She is due for her mammogram this month. - We will see her back after her mammogram with labs.  She will continue taking the Arimidex.  2.  IgA lambda MGUS: - Patient has refused a bone marrow biopsy several times. -We will continue to follow her. -Her last M spike was 0.8 in February 2020 - Kappa to lambda ratio was 0.13.  Lambda light chains were further elevated at 1029.9 from 986.3. -Last skeletal survey was done on 05/2016 showed question of pagets disease. - We will  do a skeletal survey and repeat labs. -We will also set her up with a bone marrow biopsy with Dr. Delton Coombes.   3.  Iron deficiency anemia: -Likely due from CKD and MGUS. - Labs done on 08/17/2018 showed her hemoglobin 8.3, ferritin 36, LDH 156, platelets 286 - She receives Epogen every 2 weeks 40,000 units.  She will get her next injection on 08/26/2018. -We will also set her up with 2 infusions of IV iron. -We will follow-up with her in 3 months with repeat labs.  4.  CKD: - Labs on 08/17/2018 showed her creatinine at 5.24 - She was reportedly planned to start dialysis.  Her sister reports she has not yet started dialysis. -She receives Epogen every 2 weeks 40,000 units. -She follows up with her nephrologist.  5.  Osteopenia: - Her last DEXA scan was done on 06/03/2017 showed osteopenia. -She will continue her calcium and vitamin D daily. -We will repeat a bone density test in 05/2019       Orders placed this encounter:  Orders Placed This Encounter  Procedures  . DG Bone Survey Met  . Lactate dehydrogenase  . Protein electrophoresis, serum  . Immunofixation electrophoresis  . Kappa/lambda light chains  . CBC with Differential/Platelet  . Comprehensive metabolic panel  . Beta 2 microglobulin, serum  . IgG, IgA, IgM      Francene Finders FNP-C [facility] [phone]

## 2018-08-23 NOTE — Assessment & Plan Note (Addendum)
1.  Stage I invasive ductal carcinoma of the right breast: - ER/PR positive, HER-2 positive. -Status post mastectomy on 10/24/2014, 1.1 cm, followed by adjuvant chemotherapy consisting of weekly Taxol/Herceptin from 12/28/2014 -Herceptin x52 weeks completed on 11/22/2015. -Anastrozole started on 03/11/2015. -She was also started on extended adjuvant therapy with neratinib on 06/08/2016, this was stopped after 1 year of therapy. - Her mastectomy site on the right side today is within normal limits.  Left breast has no palpable masses. - Last mammogram done on 08/18/2017 was B RADS category 1 negative.  She is due for her mammogram this month. - We will see her back after her mammogram with labs.  She will continue taking the Arimidex.  2.  IgA lambda MGUS: - Patient has refused a bone marrow biopsy several times. -We will continue to follow her. -Her last M spike was 0.8 in February 2020 - Kappa to lambda ratio was 0.13.  Lambda light chains were further elevated at 1029.9 from 986.3. -Last skeletal survey was done on 05/2016 showed question of pagets disease. - We will do a skeletal survey and repeat labs. -We will also set her up with a bone marrow biopsy with Dr. Delton Coombes.   3.  Iron deficiency anemia: -Likely due from CKD and MGUS. - Labs done on 08/17/2018 showed her hemoglobin 8.3, ferritin 36, LDH 156, platelets 286 - She receives Epogen every 2 weeks 40,000 units.  She will get her next injection on 08/26/2018. -We will also set her up with 2 infusions of IV iron. -We will follow-up with her in 3 months with repeat labs.  4.  CKD: - Labs on 08/17/2018 showed her creatinine at 5.24 - She was reportedly planned to start dialysis.  Her sister reports she has not yet started dialysis. -She receives Epogen every 2 weeks 40,000 units. -She follows up with her nephrologist.  5.  Osteopenia: - Her last DEXA scan was done on 06/03/2017 showed osteopenia. -She will continue her calcium and  vitamin D daily. -We will repeat a bone density test in 05/2019

## 2018-08-23 NOTE — Patient Instructions (Signed)
[facility] Discharge Instructions     Thank you for choosing [facility] at [facility] to provide your oncology and hematology care.  To afford each patient quality time with our provider, please arrive at least 15 minutes before your scheduled appointment time.   If you have a lab appointment with the Cancer Center please come in thru the  Main Entrance and check in at the main information desk  You need to re-schedule your appointment should you arrive 10 or more minutes late.  We strive to give you quality time with our providers, and arriving late affects you and other patients whose appointments are after yours.  Also, if you no show three or more times for appointments you may be dismissed from the clinic at the providers discretion.     Again, thank you for choosing [facility].  Our hope is that these requests will decrease the amount of time that you wait before being seen by our physicians.       _____________________________________________________________  Should you have questions after your visit to  Cancer Center, please contact our office at [phone] between the hours of 8:00 a.m. and 4:30 p.m.  Voicemails left after 4:00 p.m. will not be returned until the following business day.  For prescription refill requests, have your pharmacy contact our office and allow 72 hours.    Cancer Center Support Programs:   > Cancer Support Group  2nd Tuesday of the month 1pm-2pm, Journey Room    

## 2018-08-26 ENCOUNTER — Other Ambulatory Visit: Payer: Self-pay

## 2018-08-26 ENCOUNTER — Encounter (HOSPITAL_COMMUNITY)
Admission: RE | Admit: 2018-08-26 | Discharge: 2018-08-26 | Disposition: A | Discharge status: Home / Self Care | Payer: Medicare Other | Source: Ambulatory Visit | Attending: Nephrology | Admitting: Nephrology

## 2018-08-26 ENCOUNTER — Encounter (HOSPITAL_COMMUNITY): Payer: Self-pay

## 2018-08-26 DIAGNOSIS — N185 Chronic kidney disease, stage 5: Secondary | ICD-10-CM | POA: Diagnosis not present

## 2018-08-26 LAB — RENAL FUNCTION PANEL
Albumin: 3.6 g/dL (ref 3.5–5.0)
Anion gap: 11 (ref 5–15)
BUN: 41 mg/dL — ABNORMAL HIGH (ref 8–23)
CO2: 23 mmol/L (ref 22–32)
Calcium: 9 mg/dL (ref 8.9–10.3)
Chloride: 108 mmol/L (ref 98–111)
Creatinine, Ser: 5.15 mg/dL — ABNORMAL HIGH (ref 0.44–1.00)
GFR calc Af Amer: 8 mL/min — ABNORMAL LOW (ref >60)
GFR calc non Af Amer: 7 mL/min — ABNORMAL LOW (ref >60)
Glucose, Bld: 141 mg/dL — ABNORMAL HIGH (ref 70–99)
Phosphorus: 4.6 mg/dL (ref 2.5–4.6)
Potassium: 4.2 mmol/L (ref 3.5–5.1)
Sodium: 142 mmol/L (ref 135–145)

## 2018-08-26 LAB — HEMOGLOBIN AND HEMATOCRIT, BLOOD
HCT: 28.2 % — ABNORMAL LOW (ref 36.0–46.0)
Hemoglobin: 8.8 g/dL — ABNORMAL LOW (ref 12.0–15.0)

## 2018-08-26 LAB — POCT HEMOGLOBIN-HEMACUE: Hemoglobin: 8.8 g/dL — ABNORMAL LOW (ref 12.0–15.0)

## 2018-08-26 MED ORDER — EPOETIN ALFA 4000 UNIT/ML IJ SOLN
INTRAMUSCULAR | Status: AC
  Filled 2018-08-26: qty 1

## 2018-08-26 MED ORDER — EPOETIN ALFA 4000 UNIT/ML IJ SOLN
4000.0000 [IU] | Freq: Once | INTRAMUSCULAR | Status: AC
  Administered 2018-08-26: 4000 [IU] via SUBCUTANEOUS

## 2018-08-27 LAB — PTH, INTACT AND CALCIUM
Calcium, Total (PTH): 8.9 mg/dL (ref 8.7–10.3)
PTH: 50 pg/mL (ref 15–65)

## 2018-08-27 LAB — VITAMIN D 25 HYDROXY (VIT D DEFICIENCY, FRACTURES): Vit D, 25-Hydroxy: 45.2 ng/mL (ref 30.0–100.0)

## 2018-08-29 ENCOUNTER — Inpatient Hospital Stay (HOSPITAL_COMMUNITY): Payer: Medicare Other

## 2018-08-29 ENCOUNTER — Other Ambulatory Visit: Payer: Self-pay

## 2018-08-29 ENCOUNTER — Ambulatory Visit (HOSPITAL_COMMUNITY): Payer: Medicare Other

## 2018-08-29 DIAGNOSIS — C50411 Malignant neoplasm of upper-outer quadrant of right female breast: Secondary | ICD-10-CM | POA: Diagnosis not present

## 2018-08-29 DIAGNOSIS — D472 Monoclonal gammopathy: Secondary | ICD-10-CM

## 2018-08-29 LAB — CBC WITH DIFFERENTIAL/PLATELET
Abs Immature Granulocytes: 0.01 10*3/uL (ref 0.00–0.07)
Basophils Absolute: 0 10*3/uL (ref 0.0–0.1)
Basophils Relative: 1 %
Eosinophils Absolute: 0.2 10*3/uL (ref 0.0–0.5)
Eosinophils Relative: 3 %
HCT: 27.8 % — ABNORMAL LOW (ref 36.0–46.0)
Hemoglobin: 8.7 g/dL — ABNORMAL LOW (ref 12.0–15.0)
Immature Granulocytes: 0 %
Lymphocytes Relative: 14 %
Lymphs Abs: 0.8 10*3/uL (ref 0.7–4.0)
MCH: 29.5 pg (ref 26.0–34.0)
MCHC: 31.3 g/dL (ref 30.0–36.0)
MCV: 94.2 fL (ref 80.0–100.0)
Monocytes Absolute: 0.4 10*3/uL (ref 0.1–1.0)
Monocytes Relative: 6 %
Neutro Abs: 4.6 10*3/uL (ref 1.7–7.7)
Neutrophils Relative %: 76 %
Platelets: 250 10*3/uL (ref 150–400)
RBC: 2.95 MIL/uL — ABNORMAL LOW (ref 3.87–5.11)
RDW: 16.4 % — ABNORMAL HIGH (ref 11.5–15.5)
WBC: 6 10*3/uL (ref 4.0–10.5)
nRBC: 0 % (ref 0.0–0.2)

## 2018-08-29 LAB — COMPREHENSIVE METABOLIC PANEL
ALT: 11 U/L (ref 0–44)
AST: 19 U/L (ref 15–41)
Albumin: 3.5 g/dL (ref 3.5–5.0)
Alkaline Phosphatase: 53 U/L (ref 38–126)
Anion gap: 12 (ref 5–15)
BUN: 40 mg/dL — ABNORMAL HIGH (ref 8–23)
CO2: 25 mmol/L (ref 22–32)
Calcium: 9.2 mg/dL (ref 8.9–10.3)
Chloride: 105 mmol/L (ref 98–111)
Creatinine, Ser: 4.96 mg/dL — ABNORMAL HIGH (ref 0.44–1.00)
GFR calc Af Amer: 9 mL/min — ABNORMAL LOW (ref >60)
GFR calc non Af Amer: 8 mL/min — ABNORMAL LOW (ref >60)
Glucose, Bld: 118 mg/dL — ABNORMAL HIGH (ref 70–99)
Potassium: 4.3 mmol/L (ref 3.5–5.1)
Sodium: 142 mmol/L (ref 135–145)
Total Bilirubin: 0.3 mg/dL (ref 0.3–1.2)
Total Protein: 7.8 g/dL (ref 6.5–8.1)

## 2018-08-29 LAB — LACTATE DEHYDROGENASE: LDH: 152 U/L (ref 98–192)

## 2018-08-30 LAB — PROTEIN ELECTROPHORESIS, SERUM
A/G Ratio: 0.9 (ref 0.7–1.7)
Albumin ELP: 3.5 g/dL (ref 2.9–4.4)
Alpha-1-Globulin: 0.2 g/dL (ref 0.0–0.4)
Alpha-2-Globulin: 0.8 g/dL (ref 0.4–1.0)
Beta Globulin: 1.7 g/dL — ABNORMAL HIGH (ref 0.7–1.3)
Gamma Globulin: 0.9 g/dL (ref 0.4–1.8)
Globulin, Total: 3.7 g/dL (ref 2.2–3.9)
M-Spike, %: 0.9 g/dL — ABNORMAL HIGH
Total Protein ELP: 7.2 g/dL (ref 6.0–8.5)

## 2018-08-30 LAB — IGG, IGA, IGM
IgA: 1259 mg/dL — ABNORMAL HIGH (ref 64–422)
IgG (Immunoglobin G), Serum: 1021 mg/dL (ref 586–1602)
IgM (Immunoglobulin M), Srm: 13 mg/dL — ABNORMAL LOW (ref 26–217)

## 2018-08-30 LAB — IMMUNOFIXATION ELECTROPHORESIS
IgA: 1325 mg/dL — ABNORMAL HIGH (ref 64–422)
IgG (Immunoglobin G), Serum: 1083 mg/dL (ref 586–1602)
IgM (Immunoglobulin M), Srm: 13 mg/dL — ABNORMAL LOW (ref 26–217)
Total Protein ELP: 7.2 g/dL (ref 6.0–8.5)

## 2018-08-30 LAB — KAPPA/LAMBDA LIGHT CHAINS
Kappa free light chain: 148.9 mg/L — ABNORMAL HIGH (ref 3.3–19.4)
Kappa, lambda light chain ratio: 0.13 — ABNORMAL LOW (ref 0.26–1.65)
Lambda free light chains: 1132.7 mg/L — ABNORMAL HIGH (ref 5.7–26.3)

## 2018-08-30 LAB — BETA 2 MICROGLOBULIN, SERUM: Beta-2 Microglobulin: 13.5 mg/L — ABNORMAL HIGH (ref 0.6–2.4)

## 2018-09-02 ENCOUNTER — Ambulatory Visit (HOSPITAL_COMMUNITY): Payer: Medicare Other

## 2018-09-05 ENCOUNTER — Ambulatory Visit (HOSPITAL_COMMUNITY): Payer: Medicare Other

## 2018-09-07 ENCOUNTER — Other Ambulatory Visit (HOSPITAL_COMMUNITY): Payer: Medicare Other

## 2018-09-07 ENCOUNTER — Encounter (HOSPITAL_COMMUNITY): Payer: Medicare Other

## 2018-09-07 ENCOUNTER — Encounter (HOSPITAL_COMMUNITY): Payer: Medicare Other | Admitting: Hematology

## 2018-09-12 ENCOUNTER — Ambulatory Visit (HOSPITAL_COMMUNITY): Payer: Medicare Other

## 2018-09-12 ENCOUNTER — Telehealth (HOSPITAL_COMMUNITY): Payer: Self-pay | Admitting: Surgery

## 2018-09-12 NOTE — Telephone Encounter (Signed)
Pt's sister Deneise Lever had left a voicemail saying the pt was having nosebleed that they could not stop on Friday.  Called Deneise Lever back and she had contacted a nurse in her community that helped Adama's nosebleed stop.  However, the nosebleed has lasted Friday into Saturday and Terrell still is having small amounts of blood in her nose.  Spoke to Logan NP, and she is adding pt to her schedule tomorrow 7/7.  Called Deneise Lever back and she verbalized understanding of appt for tomorrow.

## 2018-09-13 ENCOUNTER — Encounter (HOSPITAL_COMMUNITY): Payer: Self-pay | Admitting: Hematology

## 2018-09-13 ENCOUNTER — Other Ambulatory Visit: Payer: Self-pay

## 2018-09-13 ENCOUNTER — Encounter (HOSPITAL_COMMUNITY): Payer: Self-pay

## 2018-09-13 ENCOUNTER — Encounter (HOSPITAL_COMMUNITY)
Admission: RE | Admit: 2018-09-13 | Discharge: 2018-09-13 | Disposition: A | Discharge status: Home / Self Care | Payer: Medicare Other | Source: Ambulatory Visit | Attending: Nephrology | Admitting: Nephrology

## 2018-09-13 ENCOUNTER — Inpatient Hospital Stay (HOSPITAL_COMMUNITY): Payer: Medicare Other | Attending: Internal Medicine | Admitting: Hematology

## 2018-09-13 VITALS — BP 123/60 | HR 66 | Temp 97.9°F | Resp 16 | Wt 171.3 lb

## 2018-09-13 DIAGNOSIS — Z170 Estrogen receptor positive status [ER+]: Secondary | ICD-10-CM | POA: Insufficient documentation

## 2018-09-13 DIAGNOSIS — C50411 Malignant neoplasm of upper-outer quadrant of right female breast: Secondary | ICD-10-CM | POA: Diagnosis not present

## 2018-09-13 DIAGNOSIS — N184 Chronic kidney disease, stage 4 (severe): Secondary | ICD-10-CM | POA: Diagnosis not present

## 2018-09-13 DIAGNOSIS — D509 Iron deficiency anemia, unspecified: Secondary | ICD-10-CM | POA: Diagnosis not present

## 2018-09-13 DIAGNOSIS — M8580 Other specified disorders of bone density and structure, unspecified site: Secondary | ICD-10-CM

## 2018-09-13 DIAGNOSIS — N185 Chronic kidney disease, stage 5: Secondary | ICD-10-CM | POA: Insufficient documentation

## 2018-09-13 DIAGNOSIS — D631 Anemia in chronic kidney disease: Secondary | ICD-10-CM | POA: Insufficient documentation

## 2018-09-13 DIAGNOSIS — Z79811 Long term (current) use of aromatase inhibitors: Secondary | ICD-10-CM | POA: Insufficient documentation

## 2018-09-13 DIAGNOSIS — C9000 Multiple myeloma not having achieved remission: Secondary | ICD-10-CM | POA: Insufficient documentation

## 2018-09-13 DIAGNOSIS — D472 Monoclonal gammopathy: Secondary | ICD-10-CM | POA: Diagnosis present

## 2018-09-13 LAB — IRON AND TIBC
Iron: 27 ug/dL — ABNORMAL LOW (ref 28–170)
Saturation Ratios: 15 % (ref 10.4–31.8)
TIBC: 177 ug/dL — ABNORMAL LOW (ref 250–450)
UIBC: 150 ug/dL

## 2018-09-13 LAB — ABO/RH: ABO/RH(D): A POS

## 2018-09-13 LAB — HEMOGLOBIN AND HEMATOCRIT, BLOOD
HCT: 19.6 % — ABNORMAL LOW (ref 36.0–46.0)
Hemoglobin: 6.2 g/dL — CL (ref 12.0–15.0)

## 2018-09-13 LAB — FERRITIN: Ferritin: 31 ng/mL (ref 11–307)

## 2018-09-13 LAB — POCT HEMOGLOBIN-HEMACUE: Hemoglobin: 6.1 g/dL — CL (ref 12.0–15.0)

## 2018-09-13 LAB — PREPARE RBC (CROSSMATCH)

## 2018-09-13 MED ORDER — EPOETIN ALFA 3000 UNIT/ML IJ SOLN: 4000.0000 [IU] | INTRAMUSCULAR | Status: DC

## 2018-09-13 MED ORDER — EPOETIN ALFA 4000 UNIT/ML IJ SOLN
4000.0000 [IU] | INTRAMUSCULAR | Status: DC
  Administered 2018-09-13: 4000 [IU] via SUBCUTANEOUS

## 2018-09-13 MED ORDER — EPOETIN ALFA 4000 UNIT/ML IJ SOLN
INTRAMUSCULAR | Status: AC
  Filled 2018-09-13: qty 1

## 2018-09-13 NOTE — Progress Notes (Signed)
 [facility] [address] [place], [place] [address]   CLINIC:  Medical Oncology/Hematology  PCP:  Shah, Ashish, MD [address] [place] [address] [phone]   REASON FOR VISIT:  Follow-up for anemia  CURRENT THERAPY: clinical surveillance   BRIEF ONCOLOGIC HISTORY:  Oncology History  Stage 1 infiltrating ductal carcinoma of right female breast (HCC)  09/12/2014 Mammogram   Mass in upper outer R breast, middle third depth appears slightly larger than it was on prior exam with more irreg spiculated margins   10/02/2014 Pathology Results   biopsy with invasive ductal carcinoma high grade 1.1 cm, dcis solid type. additional R breast tissue, excision, invasive ductal high grade 0.9 cm    10/24/2014 Pathology Results   no residual invasive carcinoma, DCIS, focal, atypical ductal hyperplasia, 0/7 LN positive for metastatic carcinoma ER > 90%, PR 30%, HER 2 2+   10/24/2014 Cancer Staging   [id number]   10/24/2014 Surgery   R mastectomy, T1c, N0   12/28/2014 - 11/22/2015 Chemotherapy   Taxol/herceptin weekly X 12, Herceptin every 21 days, last due in September 2017   03/11/2015 -  Anti-estrogen oral therapy   Arimidex 1 mg daily   09/12/2015 Imaging   MUGA- The left ventricular ejection fraction equals 68%.   06/08/2016 Treatment Plan Change   Started Nerlynx      CANCER STAGING: Cancer Staging Stage 1 infiltrating ductal carcinoma of right female breast (HCC) Staging form: Breast, AJCC 7th Edition - Clinical stage from 08/30/2015: Stage IA (T1c, N0, M0) - Signed by Kefalas, Thomas S, PA-C on 08/30/2015    INTERVAL HISTORY:  Carly Wood 82 y.o. female presents today for follow up. She reports overall doing well. Denies any significant fatigue.  States she had a nosebleed over the weekend that required holding pressure for 20 minutes before it resolved.  She denies any further episodes of nosebleeding or any other obvious signs of bleeding.  She denies any  lightheadedness or dizziness.  She denies any chest pain, shortness of breath.  She does have a history of prior nosebleeds.   REVIEW OF SYSTEMS:  Review of Systems  Musculoskeletal: Positive for arthralgias and myalgias.  Neurological: Positive for extremity weakness.  All other systems reviewed and are negative.    PAST MEDICAL/SURGICAL HISTORY:  Past Medical History:  Diagnosis Date  . Anemia   . Anxiety   . Chronic kidney disease   . Chronic renal disease, stage 4, severely decreased glomerular filtration rate (GFR) between 15-29 mL/min/1.73 square meter (HCC) 08/22/2015  . Complication of anesthesia    delirious after Breast Surgery  . Dementia (HCC)    mild  . Depression   . Diabetes mellitus without complication (HCC)    type II  . Dyspnea    with activity  . GERD (gastroesophageal reflux disease)   . Glaucoma   . Hypertension   . Pneumonia   . Stage 1 infiltrating ductal carcinoma of right female breast (HCC) 08/21/2015   ER+ PR+ HER 2 neu + (3+) T1cN0    Past Surgical History:  Procedure Laterality Date  . AV FISTULA PLACEMENT Left 11/22/2017   Procedure: ARTERIOVENOUS (AV) FISTULA CREATION LEFT ARM;  Surgeon: Fields, Charles E, MD;  Location: [facility] OR;  Service: Vascular;  Laterality: Left;  . BIOPSY  08/07/2016   Procedure: BIOPSY;  Surgeon: Rourk, Robert M, MD;  Location: [facility] SUITE;  Service: Endoscopy;;  gastric ulcer biopsy  . COLONOSCOPY    . ESOPHAGOGASTRODUODENOSCOPY N/A   08/07/2016   Procedure: ESOPHAGOGASTRODUODENOSCOPY (EGD);  Surgeon: Daneil Dolin, MD;  Location: [facility];  Service: Endoscopy;  Laterality: N/A;  1215-rescheduled to 6/1 @ 2:30pm per Tretha Sciara  . ESOPHAGOGASTRODUODENOSCOPY N/A 11/27/2016   Procedure: ESOPHAGOGASTRODUODENOSCOPY (EGD);  Surgeon: Daneil Dolin, MD;  Location: [facility];  Service: Endoscopy;  Laterality: N/A;  8:15am  . FISTULA SUPERFICIALIZATION Left 02/14/2018   Procedure: FISTULA SUPERFICIALIZATION LEFT ARM;   Surgeon: Angelia Mould, MD;  Location: [place];  Service: Vascular;  Laterality: Left;  . FRACTURE SURGERY Right    ankle  . MALONEY DILATION N/A 08/07/2016   Procedure: Venia Minks DILATION;  Surgeon: Daneil Dolin, MD;  Location: [facility];  Service: Endoscopy;  Laterality: N/A;  . MASTECTOMY, PARTIAL Right   . PORT-A-CATH REMOVAL Left 11/22/2017   Procedure: REMOVAL PORT-A-CATH LEFT CHEST;  Surgeon: Elam Dutch, MD;  Location: [facility] OR;  Service: Vascular;  Laterality: Left;  . RETINAL DETACHMENT SURGERY Right      SOCIAL HISTORY:  Social History   Socioeconomic History  . Marital status: Single    Spouse name: Not on file  . Number of children: Not on file  . Years of education: Not on file  . Highest education level: Not on file  Occupational History  . Not on file  Social Needs  . Financial resource strain: Not on file  . Food insecurity    Worry: Not on file    Inability: Not on file  . Transportation needs    Medical: Not on file    Non-medical: Not on file  Tobacco Use  . Smoking status: Never Smoker  . Smokeless tobacco: Never Used  Substance and Sexual Activity  . Alcohol use: No    Alcohol/week: 0.0 standard drinks  . Drug use: No  . Sexual activity: Not on file  Lifestyle  . Physical activity    Days per week: Not on file    Minutes per session: Not on file  . Stress: Not on file  Relationships  . Social Herbalist on phone: Not on file    Gets together: Not on file    Attends religious service: Not on file    Active member of club or organization: Not on file    Attends meetings of clubs or organizations: Not on file    Relationship status: Not on file  . Intimate partner violence    Fear of current or ex partner: Not on file    Emotionally abused: Not on file    Physically abused: Not on file    Forced sexual activity: Not on file  Other Topics Concern  . Not on file  Social History Narrative  . Not on file    FAMILY  HISTORY:  Family History  Problem Relation Age of Onset  . Multiple myeloma Sister   . Brain cancer Sister   . Dementia Mother        died at 48  . Stroke Mother   . Heart failure Mother   . Diabetes Mother   . Heart disease Father   . Prostate cancer Brother   . Colon cancer Neg Hx     CURRENT MEDICATIONS:  Outpatient Encounter Medications as of 09/13/2018  Medication Sig  . ACCU-CHEK AVIVA PLUS test strip   . acetaminophen (TYLENOL) 500 MG tablet Take 500 mg by mouth every 6 (six) hours as needed.   . Alcohol Swabs (ALCOHOL PREP) 70 % PADS See  admin instructions.  . amLODipine (NORVASC) 10 MG tablet Take 10 mg by mouth daily.  . anastrozole (ARIMIDEX) 1 MG tablet TAKE 1 TABLET BY MOUTH DAILY  . anti-nausea (EMETROL) solution Take 10 mLs by mouth every 15 (fifteen) minutes as needed for nausea.   . AQUALANCE LANCETS 30G MISC USE TO test twice daily  . aspirin EC 81 MG tablet Take 81 mg by mouth daily.  . calcitRIOL (ROCALTROL) 0.25 MCG capsule Take 0.25 mcg by mouth daily at 12 noon.   . calcium-vitamin D (OSCAL 500/200 D-3) 500-200 MG-UNIT tablet Take 1 tablet by mouth daily at 12 noon.   . cloNIDine (CATAPRES) 0.3 MG tablet Take 0.3 mg by mouth 2 (two) times daily.   . epoetin alfa (EPOGEN,PROCRIT) 4000 UNIT/ML injection Inject 4,000 Units into the vein.  . HYDROcodone-acetaminophen (NORCO) 5-325 MG tablet Take 1 tablet by mouth every 6 (six) hours as needed for moderate pain. (Patient not taking: Reported on 05/05/2018)  . iron polysaccharides (NIFEREX) 150 MG capsule Take 150 mg by mouth daily at 12 noon.   . Lancet Devices (ADJUSTABLE LANCING DEVICE) MISC TO check blood glucose  . ONGLYZA 5 MG TABS tablet Take 5 mg by mouth daily.  . PARoxetine (PAXIL) 20 MG tablet Take 20 mg by mouth daily.   . pioglitazone (ACTOS) 30 MG tablet Take 15 mg by mouth daily.   . pravastatin (PRAVACHOL) 20 MG tablet Take 20 mg by mouth every evening.   . prochlorperazine (COMPAZINE) 10 MG tablet  Take 1 tablet (10 mg total) by mouth every 6 (six) hours as needed for nausea or vomiting. (Patient not taking: Reported on 05/05/2018)  . TOUJEO SOLOSTAR 300 UNIT/ML SOPN INJECT FIVE UNITS INTO THE SKIN DAILY (270 DAY SUPPLY)  . traZODone (DESYREL) 100 MG tablet Take 100 mg by mouth at bedtime.   No facility-administered encounter medications on file as of 09/13/2018.     ALLERGIES:  Allergies  Allergen Reactions  . Penicillins Other (See Comments)    Unsure of reaction Has patient had a PCN reaction causing immediate rash, facial/tongue/throat swelling, SOB or lightheadedness with hypotension: Unknown Has patient had a PCN reaction causing severe rash involving mucus membranes or skin necrosis: Unknown Has patient had a PCN reaction that required hospitalization: No Has patient had a PCN reaction occurring within the last 10 years: Unknown If all of the above answers are "NO", then may proceed with Cephalosporin use.    . Ace Inhibitors Cough and Other (See Comments)    Tongue swell  . Lisinopril Other (See Comments)    Tongue swell     PHYSICAL EXAM:  ECOG Performance status: 2  Vitals:   09/13/18 1032  BP: 123/60  Pulse: 66  Resp: 16  Temp: 97.9 F (36.6 C)  SpO2: 100%   Filed Weights   09/13/18 1032  Weight: 171 lb 4.8 oz (77.7 kg)    Physical Exam Constitutional:      Appearance: Normal appearance. She is obese.  HENT:     Head: Normocephalic.     Nose: Nose normal.     Mouth/Throat:     Mouth: Mucous membranes are moist.     Pharynx: Oropharynx is clear.  Eyes:     Extraocular Movements: Extraocular movements intact.     Conjunctiva/sclera: Conjunctivae normal.  Neck:     Musculoskeletal: Normal range of motion.  Cardiovascular:     Rate and Rhythm: Normal rate and regular rhythm.     Pulses: Normal pulses.       Heart sounds: Normal heart sounds.  Pulmonary:     Effort: Pulmonary effort is normal.     Breath sounds: Normal breath sounds.  Abdominal:      General: Bowel sounds are normal.     Palpations: Abdomen is soft.  Musculoskeletal:     Right lower leg: Edema present.     Left lower leg: Edema present.  Skin:    General: Skin is warm and dry.  Neurological:     General: No focal deficit present.     Mental Status: She is alert and oriented to person, place, and time.  Psychiatric:        Mood and Affect: Mood normal.        Behavior: Behavior normal.        Thought Content: Thought content normal.        Judgment: Judgment normal.      LABORATORY DATA:  I have reviewed the labs as listed.  CBC    Component Value Date/Time   WBC 6.0 08/29/2018 1327   RBC 2.95 (L) 08/29/2018 1327   HGB 6.2 (LL) 09/13/2018 1219   HCT 19.6 (L) 09/13/2018 1219   PLT 250 08/29/2018 1327   MCV 94.2 08/29/2018 1327   MCH 29.5 08/29/2018 1327   MCHC 31.3 08/29/2018 1327   RDW 16.4 (H) 08/29/2018 1327   LYMPHSABS 0.8 08/29/2018 1327   MONOABS 0.4 08/29/2018 1327   EOSABS 0.2 08/29/2018 1327   BASOSABS 0.0 08/29/2018 1327   CMP Latest Ref Rng & Units 08/29/2018 08/26/2018 08/26/2018  Glucose 70 - 99 mg/dL 118(H) 141(H) -  BUN 8 - 23 mg/dL 40(H) 41(H) -  Creatinine 0.44 - 1.00 mg/dL 4.96(H) 5.15(H) -  Sodium 135 - 145 mmol/L 142 142 -  Potassium 3.5 - 5.1 mmol/L 4.3 4.2 -  Chloride 98 - 111 mmol/L 105 108 -  CO2 22 - 32 mmol/L 25 23 -  Calcium 8.9 - 10.3 mg/dL 9.2 9.0 8.9  Total Protein 6.5 - 8.1 g/dL 7.8 - -  Total Bilirubin 0.3 - 1.2 mg/dL 0.3 - -  Alkaline Phos 38 - 126 U/L 53 - -  AST 15 - 41 U/L 19 - -  ALT 0 - 44 U/L 11 - -        ASSESSMENT & PLAN:   Iron deficiency anemia 1.  IgA lambda MGUS: - Patient has refused a bone marrow biopsy several times. -We will continue to follow her. - Multiple myeloma labs in June 2020 revealed M spike of 0.9, kappa elevated at 148 point, lambda elevated at 1132.7, ratio 0.13, IgA elevated at 1325, immunofixation reveals IgA lambda monoclonal protein. Beta 2 microglobulin elevated at  13.5.  Albumin 3.5. - Patient's labs reveal signs of end organ damage with hemoglobin of 8.7 serum creatinine of 4.96.  Have recommended patient proceed with bone marrow biopsy to determine amount of plasma cells in the bone marrow.  She will also need a skeletal survey to rule out lytic lesions.  Patient will be a candidate for Cytoxan, Velcade, dexamethasone. - Patient will return to clinic in 1 week for biopsy.  2.  Stage I invasive ductal carcinoma of the right breast: - ER/PR positive, HER-2 positive. -Status post mastectomy on 10/24/2014, 1.1 cm, followed by adjuvant chemotherapy consisting of weekly Taxol/Herceptin from 12/28/2014 -Herceptin x52 weeks completed on 11/22/2015. -Anastrozole started on 03/11/2015. -She was also started on extended adjuvant therapy with neratinib on 06/08/2016, this was stopped after 1 year of therapy. - Her   mastectomy site on the right side today is within normal limits.  Left breast has no palpable masses. - Last mammogram done on 08/18/2017 was B RADS category 1 negative.  She is due for her mammogram this month. - We will see her back after her mammogram with labs.  She will continue taking the Arimidex.   3.  Iron deficiency anemia: -Likely due from CKD and MGUS. - Labs done on 08/17/2018 showed her hemoglobin 8.3, ferritin 36, LDH 156, platelets 286 - She receives Epogen every 2 weeks 40,000 units.  She will get her next injection on 08/26/2018.   4.  CKD: - Labs on 08/17/2018 showed her creatinine at 5.24 - She was reportedly planned to start dialysis.  Her sister reports she has not yet started dialysis. -She receives Epogen every 2 weeks 40,000 units. -She follows up with her nephrologist.  5.  Osteopenia: - Her last DEXA scan was done on 06/03/2017 showed osteopenia. -She will continue her calcium and vitamin D daily. -We will repeat a bone density test in 05/2019      Orders placed this encounter:  Orders Placed This Encounter  Procedures  .  CBC with Differential       R , FNP [facility] [phone]   

## 2018-09-13 NOTE — Patient Instructions (Signed)
[place] at [facility]  Discharge Instructions:  You saw Joseph Art, NP, today. _______________________________________________________________  Thank you for choosing [place] at [facility] to provide your oncology and hematology care.  To afford each patient quality time with our providers, please arrive at least 15 minutes before your scheduled appointment.  You need to re-schedule your appointment if you arrive 10 or more minutes late.  We strive to give you quality time with our providers, and arriving late affects you and other patients whose appointments are after yours.  Also, if you no show three or more times for appointments you may be dismissed from the clinic.  Again, thank you for choosing [place] at [place] hope is that these requests will allow you access to exceptional care and in a timely manner. _______________________________________________________________  If you have questions after your visit, please contact our office at [phone] between the hours of 8:30 a.m. and 5:00 p.m. Voicemails left after 4:30 p.m. will not be returned until the following business day. _______________________________________________________________  For prescription refill requests, have your pharmacy contact our office. _______________________________________________________________  Recommendations made by the consultant and any test results will be sent to your referring physician. _______________________________________________________________

## 2018-09-13 NOTE — Progress Notes (Signed)
Results for Carly Wood, Carly Wood (MRN [id number]) as of 09/13/2018 12:51  Ref. Range 09/13/2018 12:19  Hemoglobin Latest Ref Range: 12.0 - 15.0 g/dL 6.2 (LL)  HCT Latest Ref Range: 36.0 - 46.0 % 19.6 (L)    Critical H&H called to Dr. Hinda Lenis.  Ordered for T&C 2units pRBCs. To be transfused tomorrow 09/14/18.

## 2018-09-13 NOTE — Assessment & Plan Note (Signed)
1.  IgA lambda MGUS: - Patient has refused a bone marrow biopsy several times. -We will continue to follow her. - Multiple myeloma labs in June 2020 revealed M spike of 0.9, kappa elevated at 148 point, lambda elevated at 1132.7, ratio 0.13, IgA elevated at 1325, immunofixation reveals IgA lambda monoclonal protein. Beta 2 microglobulin elevated at 13.5.  Albumin 3.5. - Patient's labs reveal signs of end organ damage with hemoglobin of 8.7 serum creatinine of 4.96.  Have recommended patient proceed with bone marrow biopsy to determine amount of plasma cells in the bone marrow.  She will also need a skeletal survey to rule out lytic lesions.  Patient will be a candidate for Cytoxan, Velcade, dexamethasone. - Patient will return to clinic in 1 week for biopsy.  2.  Stage I invasive ductal carcinoma of the right breast: - ER/PR positive, HER-2 positive. -Status post mastectomy on 10/24/2014, 1.1 cm, followed by adjuvant chemotherapy consisting of weekly Taxol/Herceptin from 12/28/2014 -Herceptin x52 weeks completed on 11/22/2015. -Anastrozole started on 03/11/2015. -She was also started on extended adjuvant therapy with neratinib on 06/08/2016, this was stopped after 1 year of therapy. - Her mastectomy site on the right side today is within normal limits.  Left breast has no palpable masses. - Last mammogram done on 08/18/2017 was B RADS category 1 negative.  She is due for her mammogram this month. - We will see her back after her mammogram with labs.  She will continue taking the Arimidex.   3.  Iron deficiency anemia: -Likely due from CKD and MGUS. - Labs done on 08/17/2018 showed her hemoglobin 8.3, ferritin 36, LDH 156, platelets 286 - She receives Epogen every 2 weeks 40,000 units.  She will get her next injection on 08/26/2018.   4.  CKD: - Labs on 08/17/2018 showed her creatinine at 5.24 - She was reportedly planned to start dialysis.  Her sister reports she has not yet started dialysis. -She  receives Epogen every 2 weeks 40,000 units. -She follows up with her nephrologist.  5.  Osteopenia: - Her last DEXA scan was done on 06/03/2017 showed osteopenia. -She will continue her calcium and vitamin D daily. -We will repeat a bone density test in 05/2019 

## 2018-09-14 ENCOUNTER — Encounter (HOSPITAL_COMMUNITY): Payer: Self-pay

## 2018-09-14 ENCOUNTER — Encounter (HOSPITAL_COMMUNITY)
Admission: RE | Admit: 2018-09-14 | Discharge: 2018-09-14 | Disposition: A | Discharge status: Home / Self Care | Payer: Medicare Other | Source: Ambulatory Visit | Attending: Nephrology | Admitting: Nephrology

## 2018-09-14 DIAGNOSIS — N185 Chronic kidney disease, stage 5: Secondary | ICD-10-CM | POA: Diagnosis not present

## 2018-09-14 MED ORDER — DIPHENHYDRAMINE HCL 25 MG PO CAPS
25.0000 mg | ORAL_CAPSULE | Freq: Once | ORAL | Status: AC
  Administered 2018-09-14: 25 mg via ORAL

## 2018-09-14 MED ORDER — SODIUM CHLORIDE 0.9% IV SOLUTION: Freq: Once | INTRAVENOUS | Status: DC

## 2018-09-14 MED ORDER — ACETAMINOPHEN 325 MG PO TABS
ORAL_TABLET | ORAL | Status: AC
  Filled 2018-09-14: qty 2

## 2018-09-14 MED ORDER — DIPHENHYDRAMINE HCL 25 MG PO CAPS
ORAL_CAPSULE | ORAL | Status: AC
  Filled 2018-09-14: qty 1

## 2018-09-14 MED ORDER — ACETAMINOPHEN 325 MG PO TABS
650.0000 mg | ORAL_TABLET | Freq: Once | ORAL | Status: AC
  Administered 2018-09-14: 650 mg via ORAL

## 2018-09-15 LAB — BPAM RBC
Blood Product Expiration Date: 202008032359
Blood Product Expiration Date: 202008062359
ISSUE DATE / TIME: 202007080844
ISSUE DATE / TIME: 202007081103
Unit Type and Rh: 6200
Unit Type and Rh: 6200

## 2018-09-15 LAB — TYPE AND SCREEN
ABO/RH(D): A POS
Antibody Screen: NEGATIVE
Unit division: 0
Unit division: 0

## 2018-09-16 LAB — POCT HEMOGLOBIN-HEMACUE: Hemoglobin: 6 g/dL — CL (ref 12.0–15.0)

## 2018-09-20 ENCOUNTER — Inpatient Hospital Stay (HOSPITAL_COMMUNITY): Payer: Medicare Other

## 2018-09-20 ENCOUNTER — Other Ambulatory Visit: Payer: Self-pay

## 2018-09-20 ENCOUNTER — Encounter (HOSPITAL_COMMUNITY): Payer: Self-pay | Admitting: Hematology

## 2018-09-20 ENCOUNTER — Inpatient Hospital Stay (HOSPITAL_COMMUNITY): Payer: Medicare Other | Admitting: Hematology

## 2018-09-20 ENCOUNTER — Inpatient Hospital Stay (HOSPITAL_BASED_OUTPATIENT_CLINIC_OR_DEPARTMENT_OTHER): Payer: Medicare Other | Admitting: Hematology

## 2018-09-20 DIAGNOSIS — M8580 Other specified disorders of bone density and structure, unspecified site: Secondary | ICD-10-CM

## 2018-09-20 DIAGNOSIS — Z9221 Personal history of antineoplastic chemotherapy: Secondary | ICD-10-CM

## 2018-09-20 DIAGNOSIS — Z9011 Acquired absence of right breast and nipple: Secondary | ICD-10-CM

## 2018-09-20 DIAGNOSIS — D472 Monoclonal gammopathy: Secondary | ICD-10-CM | POA: Diagnosis not present

## 2018-09-20 DIAGNOSIS — Z170 Estrogen receptor positive status [ER+]: Secondary | ICD-10-CM

## 2018-09-20 DIAGNOSIS — C50911 Malignant neoplasm of unspecified site of right female breast: Secondary | ICD-10-CM | POA: Diagnosis not present

## 2018-09-20 DIAGNOSIS — Z79811 Long term (current) use of aromatase inhibitors: Secondary | ICD-10-CM | POA: Diagnosis not present

## 2018-09-20 DIAGNOSIS — Z79899 Other long term (current) drug therapy: Secondary | ICD-10-CM

## 2018-09-20 DIAGNOSIS — C50411 Malignant neoplasm of upper-outer quadrant of right female breast: Secondary | ICD-10-CM | POA: Diagnosis not present

## 2018-09-20 DIAGNOSIS — D509 Iron deficiency anemia, unspecified: Secondary | ICD-10-CM

## 2018-09-20 DIAGNOSIS — Z923 Personal history of irradiation: Secondary | ICD-10-CM

## 2018-09-20 LAB — CBC
HCT: 32.3 % — ABNORMAL LOW (ref 36.0–46.0)
Hemoglobin: 10.1 g/dL — ABNORMAL LOW (ref 12.0–15.0)
MCH: 29.6 pg (ref 26.0–34.0)
MCHC: 31.3 g/dL (ref 30.0–36.0)
MCV: 94.7 fL (ref 80.0–100.0)
Platelets: 264 10*3/uL (ref 150–400)
RBC: 3.41 MIL/uL — ABNORMAL LOW (ref 3.87–5.11)
RDW: 15.4 % (ref 11.5–15.5)
WBC: 5.9 10*3/uL (ref 4.0–10.5)
nRBC: 0 % (ref 0.0–0.2)

## 2018-09-20 NOTE — Patient Instructions (Signed)
[place] at [facility] Discharge Instructions  You were seen today by Dr. Delton Coombes. He went over your recent lab results. You had a bone marrow biopsy done today. He will see you back in 2 weeks for follow up.   Bone Marrow Aspiration and Bone Marrow Biopsy, Adult, Care After This sheet gives you information about how to care for yourself after your procedure. Your health care provider may also give you more specific instructions. If you have problems or questions, contact your health care provider. What can I expect after the procedure? After the procedure, it is common to have:  Mild pain and tenderness.  Swelling.  Bruising. Follow these instructions at home: You may take your dressing off in 24 hours. Puncture site care      Follow instructions from your health care provider about how to take care of the puncture site. Make sure you: ? Wash your hands with soap and water before you change your bandage (dressing). If soap and water are not available, use hand sanitizer. ? Change your dressing as told by your health care provider.  Check your puncture siteevery day for signs of infection. Check for: ? More redness, swelling, or pain. ? More fluid or blood. ? Warmth. ? Pus or a bad smell. General instructions  Take over-the-counter and prescription medicines only as told by your health care provider.  Do not take baths, swim, or use a hot tub until your health care provider approves. Ask if you can take a shower or have a sponge bath.  Return to your normal activities as told by your health care provider. Ask your health care provider what activities are safe for you.  Do not drive for 24 hours if you were given a medicine to help you relax (sedative) during your procedure.  Keep all follow-up visits as told by your health care provider. This is important. Contact a health care provider if:  Your pain is not controlled with medicine. Get help  right away if:  You have a fever.  You have more redness, swelling, or pain around the puncture site.  You have more fluid or blood coming from the puncture site.  Your puncture site feels warm to the touch.  You have pus or a bad smell coming from the puncture site. These symptoms may represent a serious problem that is an emergency. Do not wait to see if the symptoms will go away. Get medical help right away. Call your local emergency services (911 in the [place] Do not drive yourself to the hospital. Summary  After the procedure, it is common to have mild pain, tenderness, swelling, and bruising.  Follow instructions from your health care provider about how to take care of the puncture site.  Get help right away if you have any symptoms of infection or if you have more blood or fluid coming from the puncture site. This information is not intended to replace advice given to you by your health care provider. Make sure you discuss any questions you have with your health care provider. Document Released: 09/12/2004 Document Revised: 06/08/2017 Document Reviewed: 08/07/2015 Elsevier Patient Education  2020 [employer].  Thank you for choosing [place] at [facility] to provide your oncology and hematology care.  To afford each patient quality time with our provider, please arrive at least 15 minutes before your scheduled appointment time.   If you have a lab appointment with the [facility] please come  in thru the  Main Entrance and check in at the main information desk  You need to re-schedule your appointment should you arrive 10 or more minutes late.  We strive to give you quality time with our providers, and arriving late affects you and other patients whose appointments are after yours.  Also, if you no show three or more times for appointments you may be dismissed from the clinic at the providers discretion.     Again, thank you for choosing [facility].  Our hope is that these requests will decrease the amount of time that you wait before being seen by our physicians.       _____________________________________________________________  Should you have questions after your visit to [facility], please contact our office at [phone] between the hours of 8:00 a.m. and 4:30 p.m.  Voicemails left after 4:00 p.m. will not be returned until the following business day.  For prescription refill requests, have your pharmacy contact our office and allow 72 hours.    Cancer Center Support Programs:   > Cancer Support Group  2nd Tuesday of the month 1pm-2pm, Journey Room

## 2018-09-20 NOTE — Progress Notes (Signed)
INDICATION:    Bone Marrow Biopsy and Aspiration Procedure Note   The patient was identified by name and date of birth, prior to start of the procedure and a timeout was performed.   An informed consent was obtained after discussing potential risks including bleeding, infection and pain.  The right posterior iliac crest was palpated, cleaned with ChloraPrep, and drapes applied.  1% lidocaine is infiltrated into the skin, subcutaneous tissue and periosteum.  Bone marrow was aspirated and smears made.  With the help of Jamshidi needle a core biopsy was obtained.  Pressure was applied to the biopsy site and bandage was placed over the biopsy site. Patient was made to lie on the back for 15 mins prior to discharge.  The procedure was tolerated well. COMPLICATIONS: None BLOOD LOSS: none Patient was discharged home in stable condition to return in 2 weeks to review results.  Patient was provided with post bone marrow biopsy instructions and instructed to call if there was any bleeding or worsening pain.  Specimens sent for flow cytometry, cytogenetics and additional studies.  Signed Derek Jack, MD

## 2018-09-20 NOTE — Progress Notes (Signed)
[address], [place] [address]   CLINIC:  Medical Oncology/Hematology  PCP:  Monico Blitz, [place] [address] [phone]   REASON FOR VISIT:  Follow-up for anemia  CURRENT THERAPY: clinical surveillance   BRIEF ONCOLOGIC HISTORY:  Oncology History  Stage 1 infiltrating ductal carcinoma of right female breast ([place])  09/12/2014 Mammogram   Mass in upper outer R breast, middle third depth appears slightly larger than it was on prior exam with more irreg spiculated margins   10/02/2014 Pathology Results   biopsy with invasive ductal carcinoma high grade 1.1 cm, dcis solid type. additional R breast tissue, excision, invasive ductal high grade 0.9 cm    10/24/2014 Pathology Results   no residual invasive carcinoma, DCIS, focal, atypical ductal hyperplasia, 0/7 LN positive for metastatic carcinoma ER > 90%, PR 30%, HER 2 2+   10/24/2014 Cancer Staging   T1cN0M0   10/24/2014 Surgery   R mastectomy, T1c, N0   12/28/2014 - 11/22/2015 Chemotherapy   Taxol/herceptin weekly X 12, Herceptin every 21 days, last due in September 2017   03/11/2015 -  Anti-estrogen oral therapy   Arimidex 1 mg daily   09/12/2015 Imaging   MUGA- The left ventricular ejection fraction equals 68%.   06/08/2016 Treatment Plan Change   Started Nerlynx      CANCER STAGING: Cancer Staging Stage 1 infiltrating ductal carcinoma of right female breast [facility]) Staging form: Breast, AJCC 7th Edition - Clinical stage from 08/30/2015: Stage IA (T1c, N0, M0) - Signed by Baird Cancer, PA-C on 08/30/2015    INTERVAL HISTORY:  Carly Wood 83 y.o. female presents for follow-up of MGUS.  Denies any bleeding per rectum or melena.  Denies any new onset bone pains.  Denies any fevers, night sweats or weight loss.  Arthralgias are stable.  Denies any ER visits or hospitalizations.  No nausea, vomiting or diarrhea reported.   REVIEW OF SYSTEMS:  Review of Systems   Musculoskeletal: Positive for arthralgias and myalgias.  Neurological: Positive for extremity weakness.  All other systems reviewed and are negative.    PAST MEDICAL/SURGICAL HISTORY:  Past Medical History:  Diagnosis Date  . Anemia   . Anxiety   . Chronic kidney disease   . Chronic renal disease, stage 4, severely decreased glomerular filtration rate (GFR) between 15-29 mL/min/1.73 square meter (HCC) 08/22/2015  . Complication of anesthesia    delirious after Breast Surgery  . Dementia ([place])    mild  . Depression   . Diabetes mellitus without complication ([place])    type II  . Dyspnea    with activity  . GERD (gastroesophageal reflux disease)   . Glaucoma   . Hypertension   . Pneumonia   . Stage 1 infiltrating ductal carcinoma of right female breast ([place]) 08/21/2015   ER+ PR+ HER 2 neu + (3+) T1cN0    Past Surgical History:  Procedure Laterality Date  . AV FISTULA PLACEMENT Left 11/22/2017   Procedure: ARTERIOVENOUS (AV) FISTULA CREATION LEFT ARM;  Surgeon: Elam Dutch, MD;  Location: [facility];  Service: Vascular;  Laterality: Left;  . BIOPSY  08/07/2016   Procedure: BIOPSY;  Surgeon: Daneil Dolin, MD;  [facility];  Service: Endoscopy;;  gastric ulcer biopsy  . COLONOSCOPY    . ESOPHAGOGASTRODUODENOSCOPY N/A 08/07/2016   Procedure: ESOPHAGOGASTRODUODENOSCOPY (EGD);  Surgeon: Daneil Dolin, MD;  [facility];  Service: Endoscopy;  Laterality: N/A;  1215-rescheduled to  6/1 @ 2:30pm per Tretha Sciara  . ESOPHAGOGASTRODUODENOSCOPY N/A 11/27/2016   Procedure: ESOPHAGOGASTRODUODENOSCOPY (EGD);  Surgeon: Daneil Dolin, MD;  [facility];  Service: Endoscopy;  Laterality: N/A;  8:15am  . FISTULA SUPERFICIALIZATION Left 02/14/2018   Procedure: FISTULA SUPERFICIALIZATION LEFT ARM;  Surgeon: Angelia Mould, MD;  Location: [facility];  Service: Vascular;  Laterality: Left;  . FRACTURE SURGERY Right    ankle  . MALONEY DILATION N/A 08/07/2016    Procedure: Venia Minks DILATION;  Surgeon: Daneil Dolin, MD;  [facility];  Service: Endoscopy;  Laterality: N/A;  . MASTECTOMY, PARTIAL Right   . PORT-A-CATH REMOVAL Left 11/22/2017   Procedure: REMOVAL PORT-A-CATH LEFT CHEST;  Surgeon: Elam Dutch, MD;  Location: [facility] OR;  Service: Vascular;  Laterality: Left;  . RETINAL DETACHMENT SURGERY Right      SOCIAL HISTORY:  Social History   Socioeconomic History  . Marital status: Single    Spouse name: Not on file  . Number of children: Not on file  . Years of education: Not on file  . Highest education level: Not on file  Occupational History  . Not on file  Social Needs  . Financial resource strain: Not on file  . Food insecurity    Worry: Not on file    Inability: Not on file  . Transportation needs    Medical: Not on file    Non-medical: Not on file  Tobacco Use  . Smoking status: Never Smoker  . Smokeless tobacco: Never Used  Substance and Sexual Activity  . Alcohol use: No    Alcohol/week: 0.0 standard drinks  . Drug use: No  . Sexual activity: Not on file  Lifestyle  . Physical activity    Days per week: Not on file    Minutes per session: Not on file  . Stress: Not on file  Relationships  . Social Herbalist on phone: Not on file    Gets together: Not on file    Attends religious service: Not on file    Active member of club or organization: Not on file    Attends meetings of clubs or organizations: Not on file    Relationship status: Not on file  . Intimate partner violence    Fear of current or ex partner: Not on file    Emotionally abused: Not on file    Physically abused: Not on file    Forced sexual activity: Not on file  Other Topics Concern  . Not on file  Social History Narrative  . Not on file    FAMILY HISTORY:  Family History  Problem Relation Age of Onset  . Multiple myeloma Sister   . Brain cancer Sister   . Dementia Mother        died at 62  . Stroke Mother    . Heart failure Mother   . Diabetes Mother   . Heart disease Father   . Prostate cancer Brother   . Colon cancer Neg Hx     CURRENT MEDICATIONS:  Outpatient Encounter Medications as of 09/20/2018  Medication Sig  . ACCU-CHEK AVIVA PLUS test strip   . acetaminophen (TYLENOL) 500 MG tablet Take 500 mg by mouth every 6 (six) hours as needed.   . Alcohol Swabs (ALCOHOL PREP) 70 % PADS See admin instructions.  Marland Kitchen amLODipine (NORVASC) 10 MG tablet Take 10 mg by mouth daily.  Marland Kitchen anastrozole (ARIMIDEX) 1 MG tablet TAKE 1 TABLET BY  MOUTH DAILY  . anti-nausea (EMETROL) solution Take 10 mLs by mouth every 15 (fifteen) minutes as needed for nausea.   Marland Kitchen AQUALANCE LANCETS 30G MISC USE TO test twice daily  . aspirin EC 81 MG tablet Take 81 mg by mouth daily.  . calcitRIOL (ROCALTROL) 0.25 MCG capsule Take 0.25 mcg by mouth daily at 12 noon.   . calcium-vitamin D (OSCAL 500/200 D-3) 500-200 MG-UNIT tablet Take 1 tablet by mouth daily at 12 noon.   . cloNIDine (CATAPRES) 0.3 MG tablet Take 0.3 mg by mouth 2 (two) times daily.   Marland Kitchen epoetin alfa (EPOGEN,PROCRIT) 4000 UNIT/ML injection Inject 4,000 Units into the vein.  Marland Kitchen HYDROcodone-acetaminophen (NORCO) 5-325 MG tablet Take 1 tablet by mouth every 6 (six) hours as needed for moderate pain.  . iron polysaccharides (NIFEREX) 150 MG capsule Take 150 mg by mouth daily at 12 noon.   Elmore Guise Devices (ADJUSTABLE LANCING DEVICE) MISC TO check blood glucose  . ONGLYZA 5 MG TABS tablet Take 5 mg by mouth daily.  Marland Kitchen PARoxetine (PAXIL) 20 MG tablet Take 20 mg by mouth daily.   . pioglitazone (ACTOS) 30 MG tablet Take 15 mg by mouth daily.   . pravastatin (PRAVACHOL) 20 MG tablet Take 20 mg by mouth every evening.   . prochlorperazine (COMPAZINE) 10 MG tablet Take 1 tablet (10 mg total) by mouth every 6 (six) hours as needed for nausea or vomiting.  Marland Kitchen TOUJEO SOLOSTAR 300 UNIT/ML SOPN INJECT FIVE UNITS INTO THE SKIN DAILY (270 DAY SUPPLY)  . traZODone (DESYREL) 100 MG  tablet Take 100 mg by mouth at bedtime.   No facility-administered encounter medications on file as of 09/20/2018.     ALLERGIES:  Allergies  Allergen Reactions  . Penicillins Other (See Comments)    Unsure of reaction Has patient had a PCN reaction causing immediate rash, facial/tongue/throat swelling, SOB or lightheadedness with hypotension: Unknown Has patient had a PCN reaction causing severe rash involving mucus membranes or skin necrosis: Unknown Has patient had a PCN reaction that required hospitalization: No Has patient had a PCN reaction occurring within the last 10 years: Unknown If all of the above answers are "NO", then may proceed with Cephalosporin use.    . Ace Inhibitors Cough and Other (See Comments)    Tongue swell  . Lisinopril Other (See Comments)    Tongue swell     PHYSICAL EXAM:  ECOG Performance status: 2  Vitals:   09/20/18 0800 09/20/18 0940  BP: (!) 165/68 (!) 149/59  Pulse: 96 61  Resp: 18 18  Temp: 98.6 F (37 C)   SpO2: 98% 97%   Filed Weights   09/20/18 0800  Weight: 172 lb 7 oz (78.2 kg)    Physical Exam Constitutional:      Appearance: Normal appearance. She is obese.  HENT:     Head: Normocephalic.     Nose: Nose normal.     Mouth/Throat:     Mouth: Mucous membranes are moist.     Pharynx: Oropharynx is clear.  Eyes:     Extraocular Movements: Extraocular movements intact.     Conjunctiva/sclera: Conjunctivae normal.  Neck:     Musculoskeletal: Normal range of motion.  Cardiovascular:     Rate and Rhythm: Normal rate and regular rhythm.     Pulses: Normal pulses.     Heart sounds: Normal heart sounds.  Pulmonary:     Effort: Pulmonary effort is normal.     Breath sounds: Normal breath sounds.  Abdominal:     General: Bowel sounds are normal.     Palpations: Abdomen is soft.  Musculoskeletal:     Right lower leg: Edema present.     Left lower leg: Edema present.  Skin:    General: Skin is warm and dry.   Neurological:     General: No focal deficit present.     Mental Status: She is alert and oriented to person, place, and time.  Psychiatric:        Mood and Affect: Mood normal.        Behavior: Behavior normal.        Thought Content: Thought content normal.        Judgment: Judgment normal.      LABORATORY DATA:  I have reviewed the labs as listed.  CBC    Component Value Date/Time   WBC 5.9 09/20/2018 0858   RBC 3.41 (L) 09/20/2018 0858   HGB 10.1 (L) 09/20/2018 0858   HCT 32.3 (L) 09/20/2018 0858   PLT 264 09/20/2018 0858   MCV 94.7 09/20/2018 0858   MCH 29.6 09/20/2018 0858   MCHC 31.3 09/20/2018 0858   RDW 15.4 09/20/2018 0858   LYMPHSABS 0.8 08/29/2018 1327   MONOABS 0.4 08/29/2018 1327   EOSABS 0.2 08/29/2018 1327   BASOSABS 0.0 08/29/2018 1327   CMP Latest Ref Rng & Units 08/29/2018 08/26/2018 08/26/2018  Glucose 70 - 99 mg/dL 118(H) 141(H) -  BUN 8 - 23 mg/dL 40(H) 41(H) -  Creatinine 0.44 - 1.00 mg/dL 4.96(H) 5.15(H) -  Sodium 135 - 145 mmol/L 142 142 -  Potassium 3.5 - 5.1 mmol/L 4.3 4.2 -  Chloride 98 - 111 mmol/L 105 108 -  CO2 22 - 32 mmol/L 25 23 -  Calcium 8.9 - 10.3 mg/dL 9.2 9.0 8.9  Total Protein 6.5 - 8.1 g/dL 7.8 - -  Total Bilirubin 0.3 - 1.2 mg/dL 0.3 - -  Alkaline Phos 38 - 126 U/L 53 - -  AST 15 - 41 U/L 19 - -  ALT 0 - 44 U/L 11 - -        ASSESSMENT & PLAN:   MGUS (monoclonal gammopathy of unknown significance) 1.  IgA lambda MGUS: -Myeloma labs in June 2020 revealed M spike of 0.9 g/dL.  Kappa light chains 148, lambda light chains 1132, ratio 0.13.  Beta-2 microglobulin 13.5. -Hemoglobin 8.7 and creatinine of 4.96. - She was recommended to have a bone marrow aspiration and biopsy.  She went back and forth and finally agreed for the procedure.  We discussed the complications of the procedure including rare chance of infection and bleeding. - She will proceed with bone marrow biopsy today.  She will be seen back in 2 weeks to discuss  the results. -We will also consider doing a PET CT scan to evaluate for any lytic lesions.  2.  Stage I IDC of the right breast, ER/PR positive and HER-2 positive: -Status post mastectomy on 10/24/2014, 1.1 cm, followed by adjuvant chemotherapy consisting of weekly Taxol and Herceptin from 12/28/2014 through 11/22/2015.  Anastrozole started on 03/11/2015. - She also received extended adjuvant therapy with neratinib for 1 year. -Last mammogram on 08/18/2017 was BI-RADS Category 1.  3.  Iron deficiency anemia: - Last Feraheme was in March 2019.  She is receiving Epogen 4000 units every 2 weeks.   4.  Osteopenia: - Her last DEXA scan was done on 06/03/2017 showed osteopenia. -She will continue her calcium and vitamin D daily. -We  will repeat a bone density test in 05/2019  Total time spent is 25 minutes with more than 50% of the time spent face-to-face discussing diagnosis, further work-up and coordination of care.    Orders placed this encounter:  No orders of the defined types were placed in this encounter.     Derek Jack, MD [place] [phone]

## 2018-09-20 NOTE — Assessment & Plan Note (Signed)
1.  IgA lambda MGUS: -Myeloma labs in June 2020 revealed M spike of 0.9 g/dL.  Kappa light chains 148, lambda light chains 1132, ratio 0.13.  Beta-2 microglobulin 13.5. -Hemoglobin 8.7 and creatinine of 4.96. - She was recommended to have a bone marrow aspiration and biopsy.  She went back and forth and finally agreed for the procedure.  We discussed the complications of the procedure including rare chance of infection and bleeding. - She will proceed with bone marrow biopsy today.  She will be seen back in 2 weeks to discuss the results. -We will also consider doing a PET CT scan to evaluate for any lytic lesions.  2.  Stage I IDC of the right breast, ER/PR positive and HER-2 positive: -Status post mastectomy on 10/24/2014, 1.1 cm, followed by adjuvant chemotherapy consisting of weekly Taxol and Herceptin from 12/28/2014 through 11/22/2015.  Anastrozole started on 03/11/2015. - She also received extended adjuvant therapy with neratinib for 1 year. -Last mammogram on 08/18/2017 was BI-RADS Category 1.  3.  Iron deficiency anemia: - Last Feraheme was in March 2019.  She is receiving Epogen 4000 units every 2 weeks.   4.  Osteopenia: - Her last DEXA scan was done on 06/03/2017 showed osteopenia. -She will continue her calcium and vitamin D daily. -We will repeat a bone density test in 05/2019

## 2018-09-20 NOTE — Progress Notes (Signed)
Pt is here today for a Bone Marrow Biopsy. Procedure explained to patient. Consent signed by all parties at 0900 am. Pt placed in prone position with both arms above her head. Time out conducted at 0912 am and all parties agree. Procedure started at 0916 am. Procedure completed at 0930 am. Dressing applied and patient repositioned supine. All specimens collected were sent to lab for processing. Patient resting under cover in bed. Post procedure VS are stable. Pt discharged home via wheelchair. Patient to return in 2 weeks for follow up and results.

## 2018-09-21 ENCOUNTER — Ambulatory Visit (HOSPITAL_COMMUNITY): Payer: Medicare Other | Admitting: Hematology

## 2018-09-21 ENCOUNTER — Other Ambulatory Visit (HOSPITAL_COMMUNITY): Payer: Medicare Other

## 2018-09-21 MED ORDER — FULVESTRANT 250 MG/5ML IM SOLN
INTRAMUSCULAR | Status: AC
  Filled 2018-09-21: qty 5

## 2018-09-22 ENCOUNTER — Other Ambulatory Visit (HOSPITAL_COMMUNITY): Payer: Self-pay | Admitting: Hematology

## 2018-09-27 ENCOUNTER — Encounter (HOSPITAL_COMMUNITY): Payer: Self-pay

## 2018-09-27 ENCOUNTER — Encounter (HOSPITAL_COMMUNITY)
Admission: RE | Admit: 2018-09-27 | Discharge: 2018-09-27 | Disposition: A | Discharge status: Home / Self Care | Payer: Medicare Other | Source: Ambulatory Visit | Attending: Nephrology | Admitting: Nephrology

## 2018-09-27 ENCOUNTER — Ambulatory Visit (HOSPITAL_COMMUNITY): Payer: Medicare Other | Admitting: Hematology

## 2018-09-27 ENCOUNTER — Other Ambulatory Visit: Payer: Self-pay

## 2018-09-27 DIAGNOSIS — N185 Chronic kidney disease, stage 5: Secondary | ICD-10-CM | POA: Diagnosis not present

## 2018-09-27 LAB — POCT HEMOGLOBIN-HEMACUE: Hemoglobin: 10.1 g/dL — ABNORMAL LOW (ref 12.0–15.0)

## 2018-09-27 MED ORDER — EPOETIN ALFA 4000 UNIT/ML IJ SOLN: 4000.0000 [IU] | Freq: Once | INTRAMUSCULAR | Status: DC

## 2018-09-28 ENCOUNTER — Other Ambulatory Visit (HOSPITAL_COMMUNITY): Payer: Self-pay | Admitting: Internal Medicine

## 2018-09-28 ENCOUNTER — Encounter (HOSPITAL_COMMUNITY): Payer: Self-pay | Admitting: Hematology

## 2018-09-28 DIAGNOSIS — Z1231 Encounter for screening mammogram for malignant neoplasm of breast: Secondary | ICD-10-CM

## 2018-09-29 ENCOUNTER — Encounter (HOSPITAL_COMMUNITY): Payer: Self-pay | Admitting: Hematology

## 2018-10-04 ENCOUNTER — Encounter: Payer: Self-pay | Admitting: General Practice

## 2018-10-04 ENCOUNTER — Ambulatory Visit (HOSPITAL_COMMUNITY)
Admission: RE | Admit: 2018-10-04 | Discharge: 2018-10-04 | Disposition: A | Discharge status: Home / Self Care | Payer: Medicare Other | Source: Ambulatory Visit | Attending: Nurse Practitioner | Admitting: Nurse Practitioner

## 2018-10-04 ENCOUNTER — Encounter (HOSPITAL_COMMUNITY): Payer: Self-pay | Admitting: Hematology

## 2018-10-04 ENCOUNTER — Inpatient Hospital Stay (HOSPITAL_BASED_OUTPATIENT_CLINIC_OR_DEPARTMENT_OTHER): Payer: Medicare Other | Admitting: Hematology

## 2018-10-04 ENCOUNTER — Other Ambulatory Visit: Payer: Self-pay

## 2018-10-04 VITALS — BP 159/70 | HR 80 | Temp 97.9°F | Resp 16 | Wt 171.3 lb

## 2018-10-04 DIAGNOSIS — D472 Monoclonal gammopathy: Secondary | ICD-10-CM | POA: Insufficient documentation

## 2018-10-04 DIAGNOSIS — C50411 Malignant neoplasm of upper-outer quadrant of right female breast: Secondary | ICD-10-CM

## 2018-10-04 DIAGNOSIS — M8580 Other specified disorders of bone density and structure, unspecified site: Secondary | ICD-10-CM

## 2018-10-04 DIAGNOSIS — Z170 Estrogen receptor positive status [ER+]: Secondary | ICD-10-CM | POA: Diagnosis not present

## 2018-10-04 DIAGNOSIS — Z79811 Long term (current) use of aromatase inhibitors: Secondary | ICD-10-CM | POA: Diagnosis not present

## 2018-10-04 DIAGNOSIS — D509 Iron deficiency anemia, unspecified: Secondary | ICD-10-CM

## 2018-10-04 DIAGNOSIS — C9000 Multiple myeloma not having achieved remission: Secondary | ICD-10-CM

## 2018-10-04 DIAGNOSIS — Z7189 Other specified counseling: Secondary | ICD-10-CM

## 2018-10-04 DIAGNOSIS — Z0000 Encounter for general adult medical examination without abnormal findings: Secondary | ICD-10-CM | POA: Insufficient documentation

## 2018-10-04 MED ORDER — ACYCLOVIR 200 MG PO CAPS: 200.0000 mg | ORAL_CAPSULE | Freq: Two times a day (BID) | ORAL | 2 refills | Status: DC

## 2018-10-04 NOTE — Assessment & Plan Note (Addendum)
1.  IgA lambda plasma cell myeloma, stage II(R-ISS), standard risk: -Labs from 08/29/2018 shows M spike of 0.9 g/dL.  Kappa light chains are 148, lambda light chains 1132 and ratio of 0.13.  LDH was normal.  Beta-2 microglobulin was 13.5. - She has worsening renal function with creatinine of 4.96.  She was referred to [place] by Dr. Lowanda Foster. - Bone marrow biopsy on 09/20/2018 shows plasma cell myeloma with 30% plasma cells, lambda restricted.  Myeloma FISH panel was normal.  Chromosome analysis shows 7, XX. - She has transfusion dependent anemia, last transfusion on 09/13/2018.  Percent saturation was 15 and ferritin was 31 on 09/13/2018. - We talked about the results of the bone marrow biopsy in detail.  She lives with her 2 sisters at home.  She is not physically active.  Given her age and comorbidities, she is not a transplant candidate. - We talked about acute treatment with triple drug regimen versus best supportive care.  She would like to try treatments.  We will start out with 2 drug regimen with Velcade and dexamethasone.  We will add Revlimid if she tolerates it well. - She does not have any baseline neuropathy.  We talked about side effects in detail. -We will obtain a baseline metastatic bone survey.  Tentatively will plan to start therapy next week. - She is already on aspirin 81 mg daily.  We will start her on acyclovir renal-based dosing.  2.  Stage I IDC of the right breast, ER/PR positive and HER-2 positive: -Status post mastectomy on 10/24/2014, 1.1 cm, followed by adjuvant chemotherapy consisting of weekly Taxol and Herceptin from 12/28/2014 through 11/22/2015.  Anastrozole started on 03/11/2015. - She also received extended adjuvant therapy with neratinib for 1 year. -Last mammogram on 08/18/2017 was BI-RADS Category 1. -She is continuing to tolerate anastrozole very well.  3.  Iron deficiency anemia: - Last Feraheme was in March 2019.  Ferritin on 09/13/2018 was 31.  She will require Feraheme  infusions. As she is also receiving Procrit 4000 units every 2 weeks.  4.  Osteopenia: - Her last DEXA scan was done on 06/03/2017 showed osteopenia. -She will continue her calcium and vitamin D daily. -We will repeat a bone density test in 05/2019

## 2018-10-04 NOTE — Progress Notes (Signed)
[facility] CSW Progress Notes  Request received from nurse navigator, Jene Every, to speak w family about options for increased caregiver support.  Spoke w Burman Freestone, sister who is caregiving for her two sisters.  There are no family members available to provide additional support.  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  May have resources including long term care insurance, assets and retirement income.  Sister will discuss options/desires w patient.  Sister state she will need help sorting out options if/when decision is made that more help is needed.  Discussed the options of additional in home aide help and/or assisted living or other out of home placement.  These options are generally private pay unless patient is Medicaid eligible.  Nurse navigator has been asked to request home health consult for physical therapy and social work to assist w determining appropriate resources.  COVID is a concern and sister is unsure about what option might provide the greatest safety for patient and family.  Has my contact information and is encouraged to call as needed after discussing options w patient.  Edwyna Shell, LCSW Clinical Social Worker Phone:  [phone]

## 2018-10-04 NOTE — Progress Notes (Signed)
Carly Wood, Carly Wood [id number]   CLINIC:  Medical Oncology/Hematology  PCP:  Carly Wood, [place] [address] [phone]   REASON FOR VISIT:  Follow-up for anemia  CURRENT THERAPY: clinical surveillance   BRIEF ONCOLOGIC HISTORY:  Oncology History  Stage 1 infiltrating ductal carcinoma of right female breast ([place])  09/12/2014 Mammogram   Mass in upper outer R breast, middle third depth appears slightly larger than it was on prior exam with more irreg spiculated margins   10/02/2014 Pathology Results   biopsy with invasive ductal carcinoma high grade 1.1 cm, dcis solid type. additional R breast tissue, excision, invasive ductal high grade 0.9 cm    10/24/2014 Pathology Results   no residual invasive carcinoma, DCIS, focal, atypical ductal hyperplasia, 0/7 LN positive for metastatic carcinoma ER > 90%, PR 30%, HER 2 2+   10/24/2014 [place] Staging   T1cN0M0   10/24/2014 Surgery   R mastectomy, T1c, N0   12/28/2014 - 11/22/2015 Chemotherapy   Taxol/herceptin weekly X 12, Herceptin every 21 days, last due in September 2017   03/11/2015 -  Anti-estrogen oral therapy   Arimidex 1 mg daily   09/12/2015 Imaging   MUGA- The left ventricular ejection fraction equals 68%.   06/08/2016 Treatment Plan Change   Started Nerlynx      Wood STAGING: Wood Staging Stage 1 infiltrating ductal carcinoma of right female breast [facility]) Staging form: Breast, AJCC 7th Edition - Clinical stage from 08/30/2015: Stage IA (T1c, N0, M0) - Signed by Carly Cancer, PA-C on 08/30/2015    INTERVAL HISTORY:  Carly Wood 83 y.o. female seen for follow-up of her bone marrow biopsy results.  She is accompanied by her sister.  Denies any tingling or numbness in the extremities.  Mild ankle swelling is stable.  Lives at home with her 2 sisters.  Not very active physically.  She used to be a Pharmacist, hospital prior to retirement.  Denies any fevers, night sweats or  weight loss.  She is taking aspirin 81 mg daily.  She does not report any lightheadedness or chest pains.  She is continuing to tolerate anastrozole very well.   REVIEW OF SYSTEMS:  Review of Systems  Musculoskeletal: Positive for arthralgias.  All other systems reviewed and are negative.    PAST MEDICAL/SURGICAL HISTORY:  Past Medical History:  Diagnosis Date  . Anemia   . Anxiety   . Chronic kidney disease   . Chronic renal disease, stage 4, severely decreased glomerular filtration rate (GFR) between 15-29 mL/min/1.73 square meter (HCC) 08/22/2015  . Complication of anesthesia    delirious after Breast Surgery  . Dementia ([place])    mild  . Depression   . Diabetes mellitus without complication ([place])    type II  . Dyspnea    with activity  . GERD (gastroesophageal reflux disease)   . Glaucoma   . Hypertension   . Pneumonia   . Stage 1 infiltrating ductal carcinoma of right female breast ([place]) 08/21/2015   ER+ PR+ HER 2 neu + (3+) T1cN0    Past Surgical History:  Procedure Laterality Date  . AV FISTULA PLACEMENT Left 11/22/2017   Procedure: ARTERIOVENOUS (AV) FISTULA CREATION LEFT ARM;  Surgeon: Elam Dutch, MD;  Location: [place];  Service: Vascular;  Laterality: Left;  . BIOPSY  08/07/2016   Procedure: BIOPSY;  Surgeon: Daneil Dolin, MD;  [facility];  Service: Endoscopy;;  gastric ulcer biopsy  .  COLONOSCOPY    . ESOPHAGOGASTRODUODENOSCOPY N/A 08/07/2016   Procedure: ESOPHAGOGASTRODUODENOSCOPY (EGD);  Surgeon: Daneil Dolin, MD;  [facility];  Service: Endoscopy;  Laterality: N/A;  1215-rescheduled to 6/1 @ 2:30pm per Tretha Sciara  . ESOPHAGOGASTRODUODENOSCOPY N/A 11/27/2016   Procedure: ESOPHAGOGASTRODUODENOSCOPY (EGD);  Surgeon: Daneil Dolin, MD;  [facility];  Service: Endoscopy;  Laterality: N/A;  8:15am  . FISTULA SUPERFICIALIZATION Left 02/14/2018   Procedure: FISTULA SUPERFICIALIZATION LEFT ARM;  Surgeon: Angelia Mould,  MD;  Location: [place];  Service: Vascular;  Laterality: Left;  . FRACTURE SURGERY Right    ankle  . MALONEY DILATION N/A 08/07/2016   Procedure: Venia Minks DILATION;  Surgeon: Daneil Dolin, MD;  [facility];  Service: Endoscopy;  Laterality: N/A;  . MASTECTOMY, PARTIAL Right   . PORT-A-CATH REMOVAL Left 11/22/2017   Procedure: REMOVAL PORT-A-CATH LEFT CHEST;  Surgeon: Elam Dutch, MD;  Location: [facility] OR;  Service: Vascular;  Laterality: Left;  . RETINAL DETACHMENT SURGERY Right      SOCIAL HISTORY:  Social History   Socioeconomic History  . Marital status: Single    Spouse name: Not on file  . Number of children: Not on file  . Years of education: Not on file  . Highest education level: Not on file  Occupational History  . Not on file  Social Needs  . Financial resource strain: Not on file  . Food insecurity    Worry: Not on file    Inability: Not on file  . Transportation needs    Medical: Not on file    Non-medical: Not on file  Tobacco Use  . Smoking status: Never Smoker  . Smokeless tobacco: Never Used  Substance and Sexual Activity  . Alcohol use: No    Alcohol/week: 0.0 standard drinks  . Drug use: No  . Sexual activity: Not on file  Lifestyle  . Physical activity    Days per week: Not on file    Minutes per session: Not on file  . Stress: Not on file  Relationships  . Social Herbalist on phone: Not on file    Gets together: Not on file    Attends religious service: Not on file    Active member of club or organization: Not on file    Attends meetings of clubs or organizations: Not on file    Relationship status: Not on file  . Intimate partner violence    Fear of current or ex partner: Not on file    Emotionally abused: Not on file    Physically abused: Not on file    Forced sexual activity: Not on file  Other Topics Concern  . Not on file  Social History Narrative  . Not on file    FAMILY HISTORY:  Family History  Problem  Relation Age of Onset  . Multiple myeloma Sister   . Brain Wood Sister   . Dementia Mother        died at 61  . Stroke Mother   . Heart failure Mother   . Diabetes Mother   . Heart disease Father   . Prostate Wood Brother   . Colon Wood Neg Hx     CURRENT MEDICATIONS:  Outpatient Encounter Medications as of 10/04/2018  Medication Sig  . ACCU-CHEK AVIVA PLUS test strip   . Alcohol Swabs (ALCOHOL PREP) 70 % PADS See admin instructions.  Marland Kitchen amLODipine (NORVASC) 10 MG tablet Take 10 mg  by mouth daily.  Marland Kitchen anastrozole (ARIMIDEX) 1 MG tablet TAKE 1 TABLET BY MOUTH DAILY  . AQUALANCE LANCETS 30G MISC USE TO test twice daily  . aspirin EC 81 MG tablet Take 81 mg by mouth daily.  . calcitRIOL (ROCALTROL) 0.25 MCG capsule Take 0.25 mcg by mouth daily at 12 noon.   . calcium-vitamin D (OSCAL 500/200 D-3) 500-200 MG-UNIT tablet Take 1 tablet by mouth daily at 12 noon.   . cloNIDine (CATAPRES) 0.3 MG tablet Take 0.3 mg by mouth 2 (two) times daily.   Marland Kitchen epoetin alfa (EPOGEN,PROCRIT) 4000 UNIT/ML injection Inject 4,000 Units into the vein.  . iron polysaccharides (NIFEREX) 150 MG capsule Take 150 mg by mouth daily at 12 noon.   Elmore Guise Devices (ADJUSTABLE LANCING DEVICE) MISC TO check blood glucose  . Multiple Vitamin (MULTIVITAMIN) capsule Take 1 capsule by mouth daily.  . ONGLYZA 5 MG TABS tablet Take 5 mg by mouth daily.  Marland Kitchen PARoxetine (PAXIL) 20 MG tablet Take 20 mg by mouth daily.   . pioglitazone (ACTOS) 30 MG tablet Take 15 mg by mouth daily.   . pravastatin (PRAVACHOL) 20 MG tablet Take 20 mg by mouth every evening.   Marland Kitchen TOUJEO SOLOSTAR 300 UNIT/ML SOPN INJECT FIVE UNITS INTO THE SKIN DAILY (270 DAY SUPPLY)  . traZODone (DESYREL) 100 MG tablet Take 100 mg by mouth at bedtime.  . [DISCONTINUED] HYDROcodone-acetaminophen (NORCO) 5-325 MG tablet Take 1 tablet by mouth every 6 (six) hours as needed for moderate pain.  . [DISCONTINUED] prochlorperazine (COMPAZINE) 10 MG tablet Take 1 tablet  (10 mg total) by mouth every 6 (six) hours as needed for nausea or vomiting.  Marland Kitchen acetaminophen (TYLENOL) 500 MG tablet Take 500 mg by mouth every 6 (six) hours as needed.   Marland Kitchen acyclovir (ZOVIRAX) 200 MG capsule Take 1 capsule (200 mg total) by mouth 2 (two) times daily.  Marland Kitchen anti-nausea (EMETROL) solution Take 10 mLs by mouth every 15 (fifteen) minutes as needed for nausea.    No facility-administered encounter medications on file as of 10/04/2018.     ALLERGIES:  Allergies  Allergen Reactions  . Penicillins Other (See Comments)    Unsure of reaction Has patient had a PCN reaction causing immediate rash, facial/tongue/throat swelling, SOB or lightheadedness with hypotension: Unknown Has patient had a PCN reaction causing severe rash involving mucus membranes or skin necrosis: Unknown Has patient had a PCN reaction that required hospitalization: No Has patient had a PCN reaction occurring within the last 10 years: Unknown If all of the above answers are "NO", then may proceed with Cephalosporin use.    . Ace Inhibitors Cough and Other (See Comments)    Tongue swell  . Lisinopril Other (See Comments)    Tongue swell     PHYSICAL EXAM:  ECOG Performance status: 2  Vitals:   10/04/18 1000  BP: (!) 159/70  Pulse: 80  Resp: 16  Temp: 97.9 F (36.6 C)  SpO2: 98%   Filed Weights   10/04/18 1000  Weight: 171 lb 5 oz (77.7 kg)    Physical Exam Constitutional:      Appearance: Normal appearance. She is obese.  HENT:     Head: Normocephalic.     Nose: Nose normal.     Mouth/Throat:     Mouth: Mucous membranes are moist.     Pharynx: Oropharynx is clear.  Eyes:     Extraocular Movements: Extraocular movements intact.     Conjunctiva/sclera: Conjunctivae normal.  Neck:  Musculoskeletal: Normal range of motion.  Cardiovascular:     Rate and Rhythm: Normal rate and regular rhythm.     Pulses: Normal pulses.     Heart sounds: Normal heart sounds.  Pulmonary:     Effort:  Pulmonary effort is normal.     Breath sounds: Normal breath sounds.  Abdominal:     General: Bowel sounds are normal.     Palpations: Abdomen is soft.  Musculoskeletal:     Right lower leg: Edema present.     Left lower leg: Edema present.  Skin:    General: Skin is warm and dry.  Neurological:     General: No focal deficit present.     Mental Status: She is alert and oriented to person, place, and time.  Psychiatric:        Mood and Affect: Mood normal.        Behavior: Behavior normal.        Thought Content: Thought content normal.        Judgment: Judgment normal.      LABORATORY DATA:  I have reviewed the labs as listed.  CBC    Component Value Date/Time   WBC 5.9 09/20/2018 0858   RBC 3.41 (L) 09/20/2018 0858   HGB 10.1 (L) 09/27/2018 1503   HCT 32.3 (L) 09/20/2018 0858   PLT 264 09/20/2018 0858   MCV 94.7 09/20/2018 0858   MCH 29.6 09/20/2018 0858   MCHC 31.3 09/20/2018 0858   RDW 15.4 09/20/2018 0858   LYMPHSABS 0.8 08/29/2018 1327   MONOABS 0.4 08/29/2018 1327   EOSABS 0.2 08/29/2018 1327   BASOSABS 0.0 08/29/2018 1327   CMP Latest Ref Rng & Units 08/29/2018 08/26/2018 08/26/2018  Glucose 70 - 99 mg/dL 118(H) 141(H) -  BUN 8 - 23 mg/dL 40(H) 41(H) -  Creatinine 0.44 - 1.00 mg/dL 4.96(H) 5.15(H) -  Sodium 135 - 145 mmol/L 142 142 -  Potassium 3.5 - 5.1 mmol/L 4.3 4.2 -  Chloride 98 - 111 mmol/L 105 108 -  CO2 22 - 32 mmol/L 25 23 -  Calcium 8.9 - 10.3 mg/dL 9.2 9.0 8.9  Total Protein 6.5 - 8.1 g/dL 7.8 - -  Total Bilirubin 0.3 - 1.2 mg/dL 0.3 - -  Alkaline Phos 38 - 126 U/L 53 - -  AST 15 - 41 U/L 19 - -  ALT 0 - 44 U/L 11 - -   I have reviewed her DEXA scan from 2019.     ASSESSMENT & PLAN:   Multiple myeloma ([place]) 1.  IgA lambda plasma cell myeloma, stage II(R-ISS), standard risk: -Labs from 08/29/2018 shows M spike of 0.9 g/dL.  Kappa light chains are 148, lambda light chains 1132 and ratio of 0.13.  LDH was normal.  Beta-2 microglobulin was  13.5. - She has worsening renal function with creatinine of 4.96.  She was referred to [place] by Dr. Lowanda Foster. - Bone marrow biopsy on 09/20/2018 shows plasma cell myeloma with 30% plasma cells, lambda restricted.  Myeloma FISH panel was normal.  Chromosome analysis shows 18, XX. - She has transfusion dependent anemia, last transfusion on 09/13/2018.  Percent saturation was 15 and ferritin was 31 on 09/13/2018. - We talked about the results of the bone marrow biopsy in detail.  She lives with her 2 sisters at home.  She is not physically active.  Given her age and comorbidities, she is not a transplant candidate. - We talked about acute treatment with triple drug regimen versus best  supportive care.  She would like to try treatments.  We will start out with 2 drug regimen with Velcade and dexamethasone.  We will add Revlimid if she tolerates it well. - She does not have any baseline neuropathy.  We talked about side effects in detail. -We will obtain a baseline metastatic bone survey.  Tentatively will plan to start therapy next week. - She is already on aspirin 81 mg daily.  We will start her on acyclovir renal-based dosing.  2.  Stage I IDC of the right breast, ER/PR positive and HER-2 positive: -Status post mastectomy on 10/24/2014, 1.1 cm, followed by adjuvant chemotherapy consisting of weekly Taxol and Herceptin from 12/28/2014 through 11/22/2015.  Anastrozole started on 03/11/2015. - She also received extended adjuvant therapy with neratinib for 1 year. -Last mammogram on 08/18/2017 was BI-RADS Category 1. -She is continuing to tolerate anastrozole very well.  3.  Iron deficiency anemia: - Last Feraheme was in March 2019.  Ferritin on 09/13/2018 was 31.  She will require Feraheme infusions. As she is also receiving Procrit 4000 units every 2 weeks.  4.  Osteopenia: - Her last DEXA scan was done on 06/03/2017 showed osteopenia. -She will continue her calcium and vitamin D daily. -We will repeat a bone  density test in 05/2019  Total time spent is 40 minutes with more than 50% of the time spent face-to-face discussing new diagnosis, treatment plan, counseling and coordination of care.    Orders placed this encounter:  Orders Placed This Encounter  Procedures  . Ambulatory referral to Home Health  . Home Health  . Face-to-face encounter (required for Medicare/Medicaid patients)      Derek Jack, MD [place] [phone]

## 2018-10-04 NOTE — Patient Instructions (Addendum)
[place] at [facility] Discharge Instructions  You were seen today by Dr. Delton Coombes.  He discussed your recent biopsy results.  Based on your results, you have a type of blood cancer called multiple myeloma.  Multiple myeloma is a cancer of the plasma cells that normally sit in your blood.  When you have a dysfunction of the plasma cells it will build up in the kidneys and can also pull calcium out of your bones and leave holes in your bone.  The results showed that your bone marrow consists of 30% of these plasma cells.  Multiple Myeloma effects your kidneys, your bones and your blood levels.  We are going to start you on treatment and it will help control any of your symptoms and help reverse some of the disease.  We can not cure this cancer but we can control it for many years.  We are going to start you on Velcade and dexamethasone.  The velcade will be given as an injection and the dexamethasone is a pill.  You will only take the pills on the day that you get the injection.  You will get an injection weekly.  We will need to get a x-ray of your bones.   Thank you for choosing [place] at [facility] to provide your oncology and hematology care.  To afford each patient quality time with our provider, please arrive at least 15 minutes before your scheduled appointment time.   If you have a lab appointment with the [place] please come in thru the  Main Entrance and check in at the main information desk  You need to re-schedule your appointment should you arrive 10 or more minutes late.  We strive to give you quality time with our providers, and arriving late affects you and other patients whose appointments are after yours.  Also, if you no show three or more times for appointments you may be dismissed from the clinic at the providers discretion.     Again, thank you for choosing [facility].  Our hope is that these requests will  decrease the amount of time that you wait before being seen by our physicians.       _____________________________________________________________  Should you have questions after your visit to [facility], please contact our office at [phone] between the hours of 8:00 a.m. and 4:30 p.m.  Voicemails left after 4:00 p.m. will not be returned until the following business day.  For prescription refill requests, have your pharmacy contact our office and allow 72 hours.    Cancer Center Support Programs:   > Cancer Support Group  2nd Tuesday of the month 1pm-2pm, Journey Room

## 2018-10-04 NOTE — Progress Notes (Signed)
I met with patient during the visit with Dr. Delton Coombes.  I helped to explain her diagnosis and general progression of disease without treatment. I discussed the treatment regimen presented to her by Dr. Delton Coombes. I explained the purpose of the medication and expected side effects.  I told her that she would receive chemotherapy education with further details next week when she starts treatment but if she should have any questions or concerns in the mean time she is more than welcome to give me a call.  The patient's sister is asking about getting her placed into a nursing home or some sort of center that can help care for her.  She is the caregiver of her two sisters.  I have contacted [facility] for them to help with placement.  I have also placed an order for home health to evaluate patient in her home setting to see what needs are there and any assistance that is necessary.  They will be in contact with [facility] once they have an assessment completed.    Patient's sister is aware that these referrals have been made.

## 2018-10-04 NOTE — Progress Notes (Signed)
START ON PATHWAY REGIMEN - Multiple Myeloma and Other Plasma Cell Dyscrasias     A cycle is every 21 days:     Bortezomib      Lenalidomide      Dexamethasone   **Always confirm dose/schedule in your pharmacy ordering system**  Patient Characteristics: Newly Diagnosed, Transplant Ineligible or Refused, Standard Risk R-ISS Staging: II Disease Classification: Newly Diagnosed Is Patient Eligible for Transplant<= Transplant Ineligible or Refused Risk Status: Standard Risk Intent of Therapy: Non-Curative / Palliative Intent, Discussed with Patient

## 2018-10-10 ENCOUNTER — Other Ambulatory Visit (HOSPITAL_COMMUNITY): Payer: Self-pay | Admitting: *Deleted

## 2018-10-10 DIAGNOSIS — C9000 Multiple myeloma not having achieved remission: Secondary | ICD-10-CM

## 2018-10-10 DIAGNOSIS — D472 Monoclonal gammopathy: Secondary | ICD-10-CM

## 2018-10-10 DIAGNOSIS — D509 Iron deficiency anemia, unspecified: Secondary | ICD-10-CM

## 2018-10-11 ENCOUNTER — Inpatient Hospital Stay (HOSPITAL_COMMUNITY): Payer: Medicare Other | Attending: Hematology

## 2018-10-11 ENCOUNTER — Inpatient Hospital Stay (HOSPITAL_COMMUNITY): Payer: Medicare Other

## 2018-10-11 ENCOUNTER — Encounter (HOSPITAL_COMMUNITY): Payer: Self-pay | Admitting: Hematology

## 2018-10-11 ENCOUNTER — Encounter (HOSPITAL_COMMUNITY)
Admission: RE | Admit: 2018-10-11 | Discharge: 2018-10-11 | Disposition: A | Discharge status: Home / Self Care | Payer: Medicare Other | Source: Ambulatory Visit | Attending: Nephrology | Admitting: Nephrology

## 2018-10-11 ENCOUNTER — Inpatient Hospital Stay (HOSPITAL_BASED_OUTPATIENT_CLINIC_OR_DEPARTMENT_OTHER): Payer: Medicare Other | Admitting: Hematology

## 2018-10-11 ENCOUNTER — Other Ambulatory Visit: Payer: Self-pay

## 2018-10-11 VITALS — BP 151/57 | HR 66 | Temp 97.1°F | Resp 16 | Wt 171.0 lb

## 2018-10-11 DIAGNOSIS — F329 Major depressive disorder, single episode, unspecified: Secondary | ICD-10-CM | POA: Diagnosis not present

## 2018-10-11 DIAGNOSIS — K219 Gastro-esophageal reflux disease without esophagitis: Secondary | ICD-10-CM | POA: Insufficient documentation

## 2018-10-11 DIAGNOSIS — C9000 Multiple myeloma not having achieved remission: Secondary | ICD-10-CM

## 2018-10-11 DIAGNOSIS — Z9011 Acquired absence of right breast and nipple: Secondary | ICD-10-CM | POA: Insufficient documentation

## 2018-10-11 DIAGNOSIS — Z5111 Encounter for antineoplastic chemotherapy: Secondary | ICD-10-CM | POA: Insufficient documentation

## 2018-10-11 DIAGNOSIS — I129 Hypertensive chronic kidney disease with stage 1 through stage 4 chronic kidney disease, or unspecified chronic kidney disease: Secondary | ICD-10-CM | POA: Insufficient documentation

## 2018-10-11 DIAGNOSIS — F0390 Unspecified dementia without behavioral disturbance: Secondary | ICD-10-CM | POA: Insufficient documentation

## 2018-10-11 DIAGNOSIS — F419 Anxiety disorder, unspecified: Secondary | ICD-10-CM | POA: Insufficient documentation

## 2018-10-11 DIAGNOSIS — D509 Iron deficiency anemia, unspecified: Secondary | ICD-10-CM | POA: Diagnosis present

## 2018-10-11 DIAGNOSIS — M8580 Other specified disorders of bone density and structure, unspecified site: Secondary | ICD-10-CM | POA: Diagnosis not present

## 2018-10-11 DIAGNOSIS — C50911 Malignant neoplasm of unspecified site of right female breast: Secondary | ICD-10-CM | POA: Diagnosis not present

## 2018-10-11 DIAGNOSIS — D631 Anemia in chronic kidney disease: Secondary | ICD-10-CM | POA: Insufficient documentation

## 2018-10-11 DIAGNOSIS — Z79811 Long term (current) use of aromatase inhibitors: Secondary | ICD-10-CM | POA: Diagnosis not present

## 2018-10-11 DIAGNOSIS — Z9221 Personal history of antineoplastic chemotherapy: Secondary | ICD-10-CM | POA: Insufficient documentation

## 2018-10-11 DIAGNOSIS — Z7982 Long term (current) use of aspirin: Secondary | ICD-10-CM | POA: Insufficient documentation

## 2018-10-11 DIAGNOSIS — N184 Chronic kidney disease, stage 4 (severe): Secondary | ICD-10-CM | POA: Insufficient documentation

## 2018-10-11 DIAGNOSIS — Z79899 Other long term (current) drug therapy: Secondary | ICD-10-CM | POA: Diagnosis not present

## 2018-10-11 DIAGNOSIS — D472 Monoclonal gammopathy: Secondary | ICD-10-CM

## 2018-10-11 DIAGNOSIS — E1122 Type 2 diabetes mellitus with diabetic chronic kidney disease: Secondary | ICD-10-CM | POA: Diagnosis not present

## 2018-10-11 DIAGNOSIS — Z170 Estrogen receptor positive status [ER+]: Secondary | ICD-10-CM | POA: Insufficient documentation

## 2018-10-11 LAB — COMPREHENSIVE METABOLIC PANEL
ALT: 9 U/L (ref 0–44)
AST: 21 U/L (ref 15–41)
Albumin: 3.1 g/dL — ABNORMAL LOW (ref 3.5–5.0)
Alkaline Phosphatase: 47 U/L (ref 38–126)
Anion gap: 12 (ref 5–15)
BUN: 42 mg/dL — ABNORMAL HIGH (ref 8–23)
CO2: 22 mmol/L (ref 22–32)
Calcium: 8.7 mg/dL — ABNORMAL LOW (ref 8.9–10.3)
Chloride: 106 mmol/L (ref 98–111)
Creatinine, Ser: 5.36 mg/dL — ABNORMAL HIGH (ref 0.44–1.00)
GFR calc Af Amer: 8 mL/min — ABNORMAL LOW (ref >60)
GFR calc non Af Amer: 7 mL/min — ABNORMAL LOW (ref >60)
Glucose, Bld: 185 mg/dL — ABNORMAL HIGH (ref 70–99)
Potassium: 4 mmol/L (ref 3.5–5.1)
Sodium: 140 mmol/L (ref 135–145)
Total Bilirubin: 0.5 mg/dL (ref 0.3–1.2)
Total Protein: 7.2 g/dL (ref 6.5–8.1)

## 2018-10-11 LAB — CBC WITH DIFFERENTIAL/PLATELET
Abs Immature Granulocytes: 0.03 10*3/uL (ref 0.00–0.07)
Basophils Absolute: 0 10*3/uL (ref 0.0–0.1)
Basophils Relative: 1 %
Eosinophils Absolute: 0.2 10*3/uL (ref 0.0–0.5)
Eosinophils Relative: 3 %
HCT: 29.3 % — ABNORMAL LOW (ref 36.0–46.0)
Hemoglobin: 9 g/dL — ABNORMAL LOW (ref 12.0–15.0)
Immature Granulocytes: 1 %
Lymphocytes Relative: 12 %
Lymphs Abs: 0.7 10*3/uL (ref 0.7–4.0)
MCH: 28.8 pg (ref 26.0–34.0)
MCHC: 30.7 g/dL (ref 30.0–36.0)
MCV: 93.9 fL (ref 80.0–100.0)
Monocytes Absolute: 0.4 10*3/uL (ref 0.1–1.0)
Monocytes Relative: 6 %
Neutro Abs: 4.5 10*3/uL (ref 1.7–7.7)
Neutrophils Relative %: 77 %
Platelets: 237 10*3/uL (ref 150–400)
RBC: 3.12 MIL/uL — ABNORMAL LOW (ref 3.87–5.11)
RDW: 15 % (ref 11.5–15.5)
WBC: 5.8 10*3/uL (ref 4.0–10.5)
nRBC: 0 % (ref 0.0–0.2)

## 2018-10-11 LAB — POCT HEMOGLOBIN-HEMACUE: Hemoglobin: 11.2 g/dL — ABNORMAL LOW (ref 12.0–15.0)

## 2018-10-11 MED ORDER — DEXAMETHASONE 4 MG PO TABS
10.0000 mg | ORAL_TABLET | Freq: Once | ORAL | Status: AC
  Administered 2018-10-11: 13:00:00 10 mg via ORAL
  Filled 2018-10-11: qty 3

## 2018-10-11 MED ORDER — BORTEZOMIB CHEMO SQ INJECTION 3.5 MG (2.5MG/ML)
1.3000 mg/m2 | Freq: Once | INTRAMUSCULAR | Status: AC
  Administered 2018-10-11: 2.5 mg via SUBCUTANEOUS
  Filled 2018-10-11: qty 1

## 2018-10-11 MED ORDER — PROCHLORPERAZINE MALEATE 10 MG PO TABS
10.0000 mg | ORAL_TABLET | Freq: Once | ORAL | Status: AC
  Administered 2018-10-11: 10 mg via ORAL
  Filled 2018-10-11: qty 1

## 2018-10-11 NOTE — Patient Instructions (Addendum)
[place] at [facility] Discharge Instructions  You were seen today by Dr. Delton Coombes. He went over your recent lab results. He will schedule you for IV iron. He will see you back in 1 week for labs and follow up.   Thank you for choosing [place] at [facility] to provide your oncology and hematology care.  To afford each patient quality time with our provider, please arrive at least 15 minutes before your scheduled appointment time.   If you have a lab appointment with the [place] please come in thru the  Main Entrance and check in at the main information desk  You need to re-schedule your appointment should you arrive 10 or more minutes late.  We strive to give you quality time with our providers, and arriving late affects you and other patients whose appointments are after yours.  Also, if you no show three or more times for appointments you may be dismissed from the clinic at the providers discretion.     Again, thank you for choosing [facility].  Our hope is that these requests will decrease the amount of time that you wait before being seen by our physicians.       _____________________________________________________________  Should you have questions after your visit to [facility], please contact our office at [phone] between the hours of 8:00 a.m. and 4:30 p.m.  Voicemails left after 4:00 p.m. will not be returned until the following business day.  For prescription refill requests, have your pharmacy contact our office and allow 72 hours.    Cancer Center Support Programs:   > Cancer Support Group  2nd Tuesday of the month 1pm-2pm, Journey Room

## 2018-10-11 NOTE — Assessment & Plan Note (Signed)
1.  IgA lambda plasma cell myeloma, stage II (R-ISS), standard risk: - Labs (08/29/2018) shows M spike of 0.9 g/dL.  Kappa light chains are 148, lambda light chains are 1132 and ratio of 0.13.  LDH was normal.  Beta-2 microglobulin 13.5. -She has worsening renal dysfunction, referred to [place] by Dr. Lowanda Foster. -Bone marrow biopsy on 09/20/2018 shows plasma cell myeloma with 30% plasma cells, lambda restricted.  Myeloma FISH panel was normal.  Chromosome analysis shows 49, XX. - She has transfusion dependent anemia.  Last transfusion was on 09/13/2018. - We talked about management of plasma cell myeloma.  She is not a transplant candidate.  We will start with a 2 drug regimen with Velcade and dexamethasone.  Will use low-dose dexamethasone of 12 mg weekly.  If she tolerates it well we can add on either Revlimid or cyclophosphamide. - We reviewed her labs.  She will proceed with her first treatment today.  We will reevaluate her in 1 week. -She will continue aspirin 81 mg.  We have started her on acyclovir twice daily.  2.  Normocytic anemia: -Hemoglobin today is 9.  Ferritin was 31 and percent saturation was 15 on 09/13/2018. - Anemia from combination of CKD and plasma cell myeloma. -We talked about starting her on Feraheme infusions.  We discussed the side effects in detail including rare chance of anaphylactic reactions.  We will start her next week. -We will also consider increasing Procrit dose.  She is currently receiving Procrit 4000 units every 2 weeks.  3.  Stage I IDC of the right breast, ER/PR positive and HER-2 positive: -Status post right mastectomy on 10/24/2014, 1.1 cm, followed by adjuvant chemotherapy consisting of weekly Taxol and Herceptin from 12/28/2014 through 11/22/2015.  Anastrozole started on 03/11/2015. - She also received extended adjuvant therapy with neratinib for 1 year. - Mammogram on 08/18/2017 was BI-RADS Category 1.  She will continue anastrozole.  4.  Osteopenia: -Last DEXA  scan on 06/03/2017 shows osteopenia.  She is continuing calcium and vitamin D. -We will plan to repeat density in March 2021.

## 2018-10-11 NOTE — Progress Notes (Signed)
1215 Labs reviewed with and pt seen by Dr. Delton Coombes and pt approved for Velcade injection today per MD                Wandra Arthurs Wojahn tolerated Velcade injection well without complaints or incident. Drug-specific written information given to and reviewed with pt and her sister with permit signed and questions answered. Understanding of information given was verbalized by both. Pt discharged via wheelchair in satisfactory condition accompanied by her sister

## 2018-10-11 NOTE — Patient Instructions (Signed)
Bortezomib (Velcade)  About This Drug  Bortezomib is used to treat cancer. It is given in the vein (IV) or by a shot under the skin (subcutaneously).  You will receive this injections under your skin.  Possible Side Effects  . Bone marrow suppression. Decrease in the number of white blood cells, red blood cells, and platelets. This may raise your risk of infection, make you tired and weak (fatigue), and raise your risk of bleeding.  . Nausea and vomiting (throwing up)  . Constipation (not able to move bowels)  . Diarrhea (loose bowel movements)  . Fever  . Tiredness  . Decreased appetite (decreased hunger)  . Effects on the nerves are called peripheral neuropathy. You may feel numbness, tingling, or pain in your hands and feet. It may be hard for you to button your clothes, open jars, or walk as usual. The effect on the nerves may get worse with more doses of the drug. These effects get better in some people after the drug is stopped but it does not get better in all people.  . Rash  Note: Each of the side effects above was reported in 20% or greater of patients treated with bortezomib. Not all possible side effects are included above.  Warnings and Precautions  . Severe peripheral neuropathy  . Low blood pressure  . Congestive heart failure - your heart has less ability to pump blood properly.  . Trouble breathing because of fluid build-up and/or inflammation in your lungs  . Nausea, vomiting, diarrhea and constipation which sometimes requires treatment to help lessen these side effects. There is also an increased risk of developing a partial or complete blockage of your small and/or large intestine.  . Changes in your central nervous system can happen. The central nervous system is made up of your brain and spinal cord. You could feel extreme tiredness, agitation, confusion, have hallucinations (see or hear things that are not there), trouble understanding or  speaking, loss of control of your bowels or bladder, eyesight changes, numbness or lack of strength to your arms, legs, face, or body, seizures or coma. If you start to have any of these symptoms let your doctor know right away.  . Tumor lysis syndrome: This drug may act on the cancer cells very quickly. This may affect how your kidneys work.  . Changes in your liver function Increased risk of a syndrome that affects your red blood cells, platelets and blood vessels in your kidneys, which can cause kidney failure and be life-threatening.  Important Information  . This drug may be present in the saliva, tears, sweat, urine, stool, vomit, semen, and vaginal secretions. Talk to your doctor and/or your nurse about the necessary precautions to take during this time.  . This drug may impair your ability to drive or use machinery. Use caution and tell your nurse or doctor if you feel dizzy, very sleepy, and/or experience low blood pressure.  Treating Side Effects  . Manage tiredness by pacing your activities for the day.  . Be sure to include periods of rest between energy-draining activities.  . To decrease the risk of infection, wash your hands regularly.  . Avoid close contact with people who have a cold, the flu, or other infections.  . Take your temperature as your doctor or nurse tells you, and whenever you feel like you may have a fever.  . To help decrease the risk of bleeding, use a soft toothbrush. Check with your nurse before using dental floss.  Marland Kitchen  Be very careful when using knives or tools.  . Use an electric shaver instead of a razor.  . Ask your doctor or nurse about medicines that are available to help stop or lessen constipation.  . If you are not able to move your bowels, check with your doctor or nurse before you use enemas, laxatives, or suppositories.  . Drink plenty of fluids (a minimum of eight glasses per day is recommended).  . If you throw up or have  loose bowel movements, you should drink more fluids so that you do not become dehydrated (lack of water in the body from losing too much fluid).  . If you have diarrhea, eat low-fiber foods that are high in protein and calories and avoid foods that can irritate your digestive tracts or lead to cramping.  . Ask your nurse or doctor about medicine that can lessen or stop your diarrhea.  . To help with nausea and vomiting, eat small, frequent meals instead of three large meals a day. Choose foods and drinks that are at room temperature. Ask your nurse or doctor about other helpful tips and medicine that is available to help stop or lessen these symptoms.  . To help with decreased appetite, eat foods high in calories and protein, such as meat, poultry, fish, dry beans, tofu, eggs, nuts, milk, yogurt, cheese, ice cream, pudding, and nutritional supplements.  . Consider using sauces and spices to increase taste. Daily exercise, with your doctor's approval, may increase your appetite.  . If you have numbness and tingling in your hands and feet, be careful when cooking, walking, and handling sharp objects and hot liquids.  . If you get a rash do not put anything on it unless your doctor or nurse says you may. Keep the area around the rash clean and dry. Ask your doctor for medicine if your rash bothers you.  Food and Drug Interactions  . This drug may interact with grapefruit and grapefruit juice. Talk to your doctor as this could make side effects worse.  . Check with your doctor or pharmacist about all other prescription medicines and over-the-counter medicines and dietary supplements (vitamins, minerals, herbs and others) you are taking before starting this medicine as there are known drug interactions with bortezomib. Also, check with your doctor or pharmacist before starting any new prescription or over-the-counter medicines, or dietary supplements to make sure that there are no  interactions.  . Avoid the use of St. John's Wort with bortezomib as this may lower the levels of the drug in your body, which can make it less effective.  When to Call the Doctor  Call your doctor or nurse if you have any of these symptoms and/or any new or unusual symptoms:  . Fever of 100.4 F (38 C) or higher  . Chills  . Tiredness that interferes with your daily activities  . Feeling dizzy or lightheaded  . Feeling that your heart is beating in a fast or not normal way (palpitations)  . Cough  . Wheezing or trouble breathing  . Easy bleeding or bruising  . Confusion and/or agitation  . Hallucinations  . Trouble understanding or speaking  . Blurry vision or changes in your eyesight  . Numbness or lack of strength to your arms, legs, face, or body  . Symptoms of a seizure such as confusion, blacking out, passing out, loss of hearing or vision, blurred vision, unusual smells or tastes (such as burning rubber), trouble talking, tremors or shaking in parts  or all of the body, repeated body movements, tense muscles that do not relax, and loss of control of urine and bowels. If you or your family member suspects you are having a seizure, call 911 right away.  . Nausea that stops you from eating or drinking and/or is not relieved by prescribed medicines  . Throwing up more than 3 times a day  . Lasting loss of appetite or rapid weight loss of five pounds in a week  . No bowel movement in 3 days or when you feel uncomfortable.  . Abdominal pain that does not go away  . Diarrhea, 4 times in one day or diarrhea with lack of strength or a feeling of being dizzy  . Numbness, tingling, or pain your hands and feet  . Swelling of legs, ankles, and/or feet  . Weight gain of 5 pounds in one week (fluid retention)  . Decreased urine, or very dark urine  . New rash and/or itching  . Rash that is not relieved by prescribed medicines  . Signs of tumor lysis: Confusion  or agitation, decreased urine, nausea/vomiting, diarrhea, muscle cramping, numbness and/or tingling, seizures.  . Signs of possible liver problems: dark urine, pale bowel movements, bad stomach pain, feeling very tired and weak, unusual itching, or yellowing of the eyes or skin  . If you think you are pregnant or may have impregnated your partner  Reproduction Warnings  . Pregnancy warning: This drug can have harmful effects on the unborn baby. Women of child bearing potential should use effective methods of birth control during your cancer treatment and for at least 7 months after treatment. Men with female partners of childbearing potential should use effective methods of birth control during your cancer treatment and for at least 4 months after your cancer treatment. Let your doctor know right away if you think you may be pregnant or may have impregnated your partner.  . Breastfeeding warning: Women should not breastfeed during treatment and for 2 months month after treatment because this drug could enter the breast milk and cause harm to a breastfeeding baby.  . Fertility warning: In men and women both, this drug may affect your ability to have children in the future. Talk with your doctor or nurse if you plan to have children. Ask for information on sperm or egg banking.   Dexamethasone (Decadron)  About This Drug  Dexamethasone is used to treat cancer, to decrease inflammation and sometimes used before and after chemotherapy to prevent or treat nausea and/or vomiting. It is given in the vein (IV) or orally (by mouth).  Possible Side Effects  . Headache  . High blood pressure  . Abnormal heart beat  . Tiredness and weakness  . Changes in mood, which may include depression or a feeling of extreme well-being  . Trouble sleeping  . Increased sweating  . Increased appetite (increased hunger)  . Weight gain  . Increase risk of infections  . Pain in your  abdomen  . Nausea  . Skin changes such as rash, dryness, redness  . Blood sugar levels may change  . Electrolyte changes  . Swelling of your legs, ankles and/or feet  . Changes in your liver function  . You may be at risk for cataracts, glaucoma or infections of the eye  . Muscle loss and / or weakness (lack of muscle strength)  . Increased risk of developing osteoporosis- your bones may become weak and brittle  Note: Not all possible side effects are  included above.  Warnings and Precautions  . This drug may cause you to feel irritable, nervous or restless.  . Allergic reactions, including anaphylaxis are rare but may happen in some patients. Signs of allergic reaction to this drug may be swelling of the face, feeling like your tongue or throat are swelling, trouble breathing, rash, itching, fever, chills, feeling dizzy, and/or feeling that your heart is beating in a fast or not normal way. If this happens, do not take another dose of this drug. You should get urgent medical treatment.  . High blood pressure and changes in electrolytes, which can cause fluid build-up around your heart, lungs or elsewhere.  . Increased risk of developing a hole in your stomach, small, and/or large intestine if you have ulcers in the lining of your stomach and/or intestine, or have diverticulitis, ulcerative colitis and/or other diseases that affect the gastrointestinal tract.  . Effects on the endocrine glands including the pituitary, adrenals or thyroid during or after use of this medication.  . Changes in the tissue of the heart, that can cause your heart to have less ability to pump blood. You may be short of breath or our arms, hands, legs and feet may swell.  . Increased risk of heart attack.  . Severe depression and other psychiatric disorders such as mood changes.  . Burning, pain and itching around your anus may happen when this drug is given in the vein too rapidly (IV). It  usually happens suddenly and resolves in less than 1 minute.  Important Information  . Talk to your doctor or your nurse before stopping this medication, it should be stopped gradually. Depending on the dose and length of treatment, you could experience serious side effects if stopped abruptly (suddenly).  . Talk to your doctor before receiving any vaccinations during your treatment. Some vaccinations are not recommended while receiving dexamethasone.  How to Take Your Medication  . For Oral (by mouth): You can take the medicine with or without food. If you have nausea or upset stomach, take it with food.  . Missed dose: If you miss a dose, do not take 2 doses at the same time or extra doses. . If you vomit a dose, take your next dose at the regular time. Do not take 2 doses at the same time  . Handling: Wash your hands after handling your medicine, your caretakers should not handle your medicine with bare hands and should wear latex gloves.  . Storage: Store this medicine in the original container at room temperature. Protect from moisture and light. Discuss with your nurse or your doctor how to dispose of unused medicine.  Treating Side Effects  . Drink plenty of fluids (a minimum of eight glasses per day is recommended).  . To help with nausea and vomiting, eat small, frequent meals instead of three large meals a day. Choose foods and drinks that are at room temperature. Ask your nurse or doctor about other helpful tips and medicine that is available to help stop or lessen these symptoms.  . If you throw up, you should drink more fluids so that you do not become dehydrated (lack of water in the body from losing too much fluid).  . Manage tiredness by pacing your activities for the day.  . Be sure to include periods of rest between energy-draining activities.  . To help with muscle weakness, get regular exercise. If you feel too tired to exercise vigorously, try taking a  short walk.  Marland Kitchen  If you are having trouble sleeping, talk to your nurse or doctor on tips to help you sleep better.  . If you are feeling depressed, talk to your nurse or doctor about it.  Marland Kitchen Keeping your pain under control is important to your well-being. Please tell your doctor or nurse if you are experiencing pain.  . If you have diabetes, keep good control of your blood sugar level. Tell your nurse or your doctor if your glucose levels are higher or lower than normal.  . To decrease the risk of infection, wash your hands regularly.  . Avoid close contact with people who have a cold, the flu, or other infections.  . Take your temperature as your doctor or nurse tells you, and whenever you feel like you may have a fever.  . If you get a rash do not put anything on it unless your doctor or nurse says you may. Keep the area around the rash clean and dry. Ask your doctor for medicine if your rash bothers you.  . Moisturize your skin several times day.  . Avoid sun exposure and apply sunscreen routinely when outdoors.  Food and Drug Interactions  . There are no known interactions of dexamethasone with food.  . Check with your doctor or pharmacist about all other prescription medicines and over-the-counter medicines and dietary supplements (vitamins, minerals, herbs and others) you are taking before starting this medicine as there are known drug interactions with dexamethasone. Also, check with your doctor or pharmacist before starting any new prescription or over-the-counter medicines, or dietary supplement to make sure that there are no interactions.  . There are known interactions of dexamethasone with other medicines and products like acetaminophen, aspirin, and ibuprofen. Ask your doctor what over-the-counter (OTC) medicines you can take.  When to Call the Doctor  Call your doctor or nurse if you have any of these symptoms and/or any new or unusual symptoms:  . Fever of  100.4 F (38 C) or higher  . Chills  . A headache that does not go away  . Trouble breathing  . Blurry vision or other changes in eyesight  . Feel irritable, nervous or restless  . Trouble falling or staying asleep  . Severe mood changes such as depression or unusual thoughts and/or behaviors  . Thoughts of hurting yourself or others, and suicide  . Tiredness that interferes with your daily activities  . Feeling that your heart is beating in a fast, slow or not normal way  . Feeling dizzy or lightheaded  . Chest pain or symptoms of a heart attack. Most heart attacks involve pain in the center of the chest that lasts more than a few minutes. The pain may go away and come back, or it can be constant. It can feel like pressure, squeezing, fullness, or pain. Sometimes pain is felt in one or both arms, the back, neck, jaw, or stomach. If any of these symptoms last 2 minutes, call 911.  Marland Kitchen Heartburn or indigestion  . Nausea that stops you from eating or drinking and/or is not relieved by prescribed medicines  . Throwing up more than 3 times a day  . Pain in your abdomen that does not go away  . Abnormal blood sugar  . Unusual thirst, passing urine often, headache, sweating, shakiness, irritability  . Swelling of legs, ankles, or feet  . Weight gain of 5 pounds in one week (fluid retention)  . Signs of possible liver problems: dark urine, pale bowel movements,  bad stomach pain, feeling very tired and weak, unusual itching, or yellowing of the eyes or skin  . Severe muscle weakness  . A new rash or a rash that is not relieved by prescribed medicines  . Signs of allergic reaction: swelling of the face, feeling like your tongue or throat are swelling, trouble breathing, rash, itching, fever, chills, feeling dizzy, and/or feeling that your heart is beating in a fast or not normal way. If this happens, call 911 for emergency care.  . If you think you may be  pregnant  Reproduction Warnings  . Pregnancy warning: It is not known if this drug may harm an unborn child. For this reason, be sure to talk with your doctor if you are pregnant or planning to become pregnant while receiving this drug. Let your doctor know right away if you think you may be pregnant or may have impregnated your partner.  . Breastfeeding warning: It is not known if this drug passes into breast milk. For this reason, women should talk to their doctor about the risks and benefits of breastfeeding during treatment with this drug because this drug may enter the breast milk and cause harm to a breastfeeding baby.  . Fertility warning: Human fertility studies have not been done with this drug. Talk with your doctor or nurse if you plan to have children. Ask for information on sperm banking. Revised July

## 2018-10-11 NOTE — Progress Notes (Signed)
[place], [place] [address]   CLINIC:  Medical Oncology/Hematology  PCP:  Monico Blitz, [place] [address] [phone]   REASON FOR VISIT:  Follow-up for plasma cell myeloma and anemia.  CURRENT THERAPY: Velcade, dexamethasone, Procrit and Feraheme.  BRIEF ONCOLOGIC HISTORY:  Oncology History  Stage 1 infiltrating ductal carcinoma of right female breast ([place])  09/12/2014 Mammogram   Mass in upper outer R breast, middle third depth appears slightly larger than it was on prior exam with more irreg spiculated margins   10/02/2014 Pathology Results   biopsy with invasive ductal carcinoma high grade 1.1 cm, dcis solid type. additional R breast tissue, excision, invasive ductal high grade 0.9 cm    10/24/2014 Pathology Results   no residual invasive carcinoma, DCIS, focal, atypical ductal hyperplasia, 0/7 LN positive for metastatic carcinoma ER > 90%, PR 30%, HER 2 2+   10/24/2014 Cancer Staging   T1cN0M0   10/24/2014 Surgery   R mastectomy, T1c, N0   12/28/2014 - 11/22/2015 Chemotherapy   Taxol/herceptin weekly X 12, Herceptin every 21 days, last due in September 2017   03/11/2015 -  Anti-estrogen oral therapy   Arimidex 1 mg daily   09/12/2015 Imaging   MUGA- The left ventricular ejection fraction equals 68%.   06/08/2016 Treatment Plan Change   Started Nerlynx   Multiple myeloma ([place])  10/04/2018 Initial Diagnosis   Multiple myeloma ([place])   10/11/2018 -  Chemotherapy   The patient had bortezomib SQ (VELCADE) chemo injection 2.5 mg, 1.3 mg/m2 = 2.5 mg, Subcutaneous,  Once, 1 of 8 cycles  for chemotherapy treatment.       CANCER STAGING: Cancer Staging Stage 1 infiltrating ductal carcinoma of right female breast [facility]) Staging form: Breast, AJCC 7th Edition - Clinical stage from 08/30/2015: Stage IA (T1c, N0, M0) - Signed by Baird Cancer, PA-C on 08/30/2015    INTERVAL HISTORY:  Ms. Tena 83 y.o. female seen for  follow-up of multiple myeloma and initiation of treatments.  She is accompanied by her sister.  Denies any baseline tingling or numbness in extremities.  Swelling in the legs has been stable.  Appetite is 75%.  Energy levels are 75%.  Pain is reported as 0.  Denies any bleeding per rectum or melena.  No nausea, vomiting or diarrhea or constipation reported.  She has been tolerating Procrit injections very well.   REVIEW OF SYSTEMS:  Review of Systems  Cardiovascular: Positive for leg swelling.  Musculoskeletal: Positive for arthralgias.  All other systems reviewed and are negative.    PAST MEDICAL/SURGICAL HISTORY:  Past Medical History:  Diagnosis Date  . Anemia   . Anxiety   . Chronic kidney disease   . Chronic renal disease, stage 4, severely decreased glomerular filtration rate (GFR) between 15-29 mL/min/1.73 square meter (HCC) 08/22/2015  . Complication of anesthesia    delirious after Breast Surgery  . Dementia ([place])    mild  . Depression   . Diabetes mellitus without complication ([place])    type II  . Dyspnea    with activity  . GERD (gastroesophageal reflux disease)   . Glaucoma   . Hypertension   . Pneumonia   . Stage 1 infiltrating ductal carcinoma of right female breast ([place]) 08/21/2015   ER+ PR+ HER 2 neu + (3+) T1cN0    Past Surgical History:  Procedure Laterality Date  . AV FISTULA PLACEMENT Left 11/22/2017   Procedure: ARTERIOVENOUS (AV) FISTULA  CREATION LEFT ARM;  Surgeon: Elam Dutch, MD;  Location: [facility];  Service: Vascular;  Laterality: Left;  . BIOPSY  08/07/2016   Procedure: BIOPSY;  Surgeon: Daneil Dolin, MD;  [facility];  Service: Endoscopy;;  gastric ulcer biopsy  . COLONOSCOPY    . ESOPHAGOGASTRODUODENOSCOPY N/A 08/07/2016   Procedure: ESOPHAGOGASTRODUODENOSCOPY (EGD);  Surgeon: Daneil Dolin, MD;  [facility];  Service: Endoscopy;  Laterality: N/A;  1215-rescheduled to 6/1 @ 2:30pm per Tretha Sciara  .  ESOPHAGOGASTRODUODENOSCOPY N/A 11/27/2016   Procedure: ESOPHAGOGASTRODUODENOSCOPY (EGD);  Surgeon: Daneil Dolin, MD;  [facility];  Service: Endoscopy;  Laterality: N/A;  8:15am  . FISTULA SUPERFICIALIZATION Left 02/14/2018   Procedure: FISTULA SUPERFICIALIZATION LEFT ARM;  Surgeon: Angelia Mould, MD;  Location: [facility];  Service: Vascular;  Laterality: Left;  . FRACTURE SURGERY Right    ankle  . MALONEY DILATION N/A 08/07/2016   Procedure: Venia Minks DILATION;  Surgeon: Daneil Dolin, MD;  [facility];  Service: Endoscopy;  Laterality: N/A;  . MASTECTOMY, PARTIAL Right   . PORT-A-CATH REMOVAL Left 11/22/2017   Procedure: REMOVAL PORT-A-CATH LEFT CHEST;  Surgeon: Elam Dutch, MD;  Location[facility];  Service: Vascular;  Laterality: Left;  . RETINAL DETACHMENT SURGERY Right      SOCIAL HISTORY:  Social History   Socioeconomic History  . Marital status: Single    Spouse name: Not on file  . Number of children: Not on file  . Years of education: Not on file  . Highest education level: Not on file  Occupational History  . Not on file  Social Needs  . Financial resource strain: Not on file  . Food insecurity    Worry: Not on file    Inability: Not on file  . Transportation needs    Medical: Not on file    Non-medical: Not on file  Tobacco Use  . Smoking status: Never Smoker  . Smokeless tobacco: Never Used  Substance and Sexual Activity  . Alcohol use: No    Alcohol/week: 0.0 standard drinks  . Drug use: No  . Sexual activity: Not on file  Lifestyle  . Physical activity    Days per week: Not on file    Minutes per session: Not on file  . Stress: Not on file  Relationships  . Social Herbalist on phone: Not on file    Gets together: Not on file    Attends religious service: Not on file    Active member of club or organization: Not on file    Attends meetings of clubs or organizations: Not on file    Relationship status: Not on  file  . Intimate partner violence    Fear of current or ex partner: Not on file    Emotionally abused: Not on file    Physically abused: Not on file    Forced sexual activity: Not on file  Other Topics Concern  . Not on file  Social History Narrative  . Not on file    FAMILY HISTORY:  Family History  Problem Relation Age of Onset  . Multiple myeloma Sister   . Brain cancer Sister   . Dementia Mother        died at 61  . Stroke Mother   . Heart failure Mother   . Diabetes Mother   . Heart disease Father   . Prostate cancer Brother   . Colon cancer  Neg Hx     CURRENT MEDICATIONS:  Outpatient Encounter Medications as of 10/11/2018  Medication Sig Note  . ACCU-CHEK AVIVA PLUS test strip    . acetaminophen (TYLENOL) 500 MG tablet Take 500 mg by mouth every 6 (six) hours as needed.    . Alcohol Swabs (ALCOHOL PREP) 70 % PADS See admin instructions.   Marland Kitchen amLODipine (NORVASC) 10 MG tablet Take 10 mg by mouth daily.   Marland Kitchen anastrozole (ARIMIDEX) 1 MG tablet TAKE 1 TABLET BY MOUTH DAILY   . anti-nausea (EMETROL) solution Take 10 mLs by mouth every 15 (fifteen) minutes as needed for nausea.    Marland Kitchen AQUALANCE LANCETS 30G MISC USE TO test twice daily   . aspirin EC 81 MG tablet Take 81 mg by mouth daily.   . bortezomib IV (VELCADE) 3.5 MG injection Inject into the vein once a week. Weekly every 21 days   . calcitRIOL (ROCALTROL) 0.25 MCG capsule Take 0.25 mcg by mouth daily at 12 noon.    . calcium-vitamin D (OSCAL 500/200 D-3) 500-200 MG-UNIT tablet Take 1 tablet by mouth daily at 12 noon.    . cloNIDine (CATAPRES) 0.3 MG tablet Take 0.3 mg by mouth 2 (two) times daily.    Marland Kitchen epoetin alfa (EPOGEN,PROCRIT) 4000 UNIT/ML injection Inject 4,000 Units into the vein.   . iron polysaccharides (NIFEREX) 150 MG capsule Take 150 mg by mouth daily at 12 noon.    Elmore Guise Devices (ADJUSTABLE LANCING DEVICE) MISC TO check blood glucose   . Multiple Vitamin (MULTIVITAMIN) capsule Take 1 capsule by mouth  daily.   . ONGLYZA 5 MG TABS tablet Take 5 mg by mouth daily.   Marland Kitchen PARoxetine (PAXIL) 20 MG tablet Take 20 mg by mouth daily.    . pioglitazone (ACTOS) 30 MG tablet Take 15 mg by mouth daily.    . pravastatin (PRAVACHOL) 20 MG tablet Take 20 mg by mouth every evening.    Marland Kitchen TOUJEO SOLOSTAR 300 UNIT/ML SOPN INJECT FIVE UNITS INTO THE SKIN DAILY (270 DAY SUPPLY)   . traZODone (DESYREL) 100 MG tablet Take 100 mg by mouth at bedtime.   Marland Kitchen acyclovir (ZOVIRAX) 200 MG capsule Take 1 capsule (200 mg total) by mouth 2 (two) times daily. 10/11/2018: Pt hasn't not started taking yet   No facility-administered encounter medications on file as of 10/11/2018.     ALLERGIES:  Allergies  Allergen Reactions  . Penicillins Other (See Comments)    Unsure of reaction Has patient had a PCN reaction causing immediate rash, facial/tongue/throat swelling, SOB or lightheadedness with hypotension: Unknown Has patient had a PCN reaction causing severe rash involving mucus membranes or skin necrosis: Unknown Has patient had a PCN reaction that required hospitalization: No Has patient had a PCN reaction occurring within the last 10 years: Unknown If all of the above answers are "NO", then may proceed with Cephalosporin use.    . Ace Inhibitors Cough and Other (See Comments)    Tongue swell  . Lisinopril Other (See Comments)    Tongue swell     PHYSICAL EXAM:  ECOG Performance status: 2  Vitals:   10/11/18 1129  BP: (!) 151/57  Pulse: 66  Resp: 16  Temp: (!) 97.1 F (36.2 C)  SpO2: 100%   Filed Weights   10/11/18 1129  Weight: 171 lb (77.6 kg)    Physical Exam Constitutional:      Appearance: Normal appearance. She is obese.  HENT:     Head: Normocephalic.  Nose: Nose normal.     Mouth/Throat:     Mouth: Mucous membranes are moist.     Pharynx: Oropharynx is clear.  Eyes:     Extraocular Movements: Extraocular movements intact.     Conjunctiva/sclera: Conjunctivae normal.  Neck:      Musculoskeletal: Normal range of motion.  Cardiovascular:     Rate and Rhythm: Normal rate and regular rhythm.     Pulses: Normal pulses.     Heart sounds: Normal heart sounds.  Pulmonary:     Effort: Pulmonary effort is normal.     Breath sounds: Normal breath sounds.  Abdominal:     General: Bowel sounds are normal.     Palpations: Abdomen is soft.  Musculoskeletal:     Right lower leg: Edema present.     Left lower leg: Edema present.  Skin:    General: Skin is warm and dry.  Neurological:     General: No focal deficit present.     Mental Status: She is alert and oriented to person, place, and time.  Psychiatric:        Mood and Affect: Mood normal.        Behavior: Behavior normal.        Thought Content: Thought content normal.        Judgment: Judgment normal.      LABORATORY DATA:  I have reviewed the labs as listed.  CBC    Component Value Date/Time   WBC 5.8 10/11/2018 1054   RBC 3.12 (L) 10/11/2018 1054   HGB 11.2 (L) 10/11/2018 1345   HCT 29.3 (L) 10/11/2018 1054   PLT 237 10/11/2018 1054   MCV 93.9 10/11/2018 1054   MCH 28.8 10/11/2018 1054   MCHC 30.7 10/11/2018 1054   RDW 15.0 10/11/2018 1054   LYMPHSABS 0.7 10/11/2018 1054   MONOABS 0.4 10/11/2018 1054   EOSABS 0.2 10/11/2018 1054   BASOSABS 0.0 10/11/2018 1054   CMP Latest Ref Rng & Units 10/11/2018 08/29/2018 08/26/2018  Glucose 70 - 99 mg/dL 185(H) 118(H) 141(H)  BUN 8 - 23 mg/dL 42(H) 40(H) 41(H)  Creatinine 0.44 - 1.00 mg/dL 5.36(H) 4.96(H) 5.15(H)  Sodium 135 - 145 mmol/L 140 142 142  Potassium 3.5 - 5.1 mmol/L 4.0 4.3 4.2  Chloride 98 - 111 mmol/L 106 105 108  CO2 22 - 32 mmol/L '22 25 23  ' Calcium 8.9 - 10.3 mg/dL 8.7(L) 9.2 9.0  Total Protein 6.5 - 8.1 g/dL 7.2 7.8 -  Total Bilirubin 0.3 - 1.2 mg/dL 0.5 0.3 -  Alkaline Phos 38 - 126 U/L 47 53 -  AST 15 - 41 U/L 21 19 -  ALT 0 - 44 U/L 9 11 -   I have reviewed her DEXA scan from 2019.     ASSESSMENT & PLAN:   Multiple myeloma ([place])  1.  IgA lambda plasma cell myeloma, stage II (R-ISS), standard risk: - Labs (08/29/2018) shows M spike of 0.9 g/dL.  Kappa light chains are 148, lambda light chains are 1132 and ratio of 0.13.  LDH was normal.  Beta-2 microglobulin 13.5. -She has worsening renal dysfunction, referred to [place] by Dr. Lowanda Foster. -Bone marrow biopsy on 09/20/2018 shows plasma cell myeloma with 30% plasma cells, lambda restricted.  Myeloma FISH panel was normal.  Chromosome analysis shows 79, XX. - She has transfusion dependent anemia.  Last transfusion was on 09/13/2018. - We talked about management of plasma cell myeloma.  She is not a transplant candidate.  We will start  with a 2 drug regimen with Velcade and dexamethasone.  Will use low-dose dexamethasone of 12 mg weekly.  If she tolerates it well we can add on either Revlimid or cyclophosphamide. - We reviewed her labs.  She will proceed with her first treatment today.  We will reevaluate her in 1 week. -She will continue aspirin 81 mg.  We have started her on acyclovir twice daily.  2.  Normocytic anemia: -Hemoglobin today is 9.  Ferritin was 31 and percent saturation was 15 on 09/13/2018. - Anemia from combination of CKD and plasma cell myeloma. -We talked about starting her on Feraheme infusions.  We discussed the side effects in detail including rare chance of anaphylactic reactions.  We will start her next week. -We will also consider increasing Procrit dose.  She is currently receiving Procrit 4000 units every 2 weeks.  3.  Stage I IDC of the right breast, ER/PR positive and HER-2 positive: -Status post right mastectomy on 10/24/2014, 1.1 cm, followed by adjuvant chemotherapy consisting of weekly Taxol and Herceptin from 12/28/2014 through 11/22/2015.  Anastrozole started on 03/11/2015. - She also received extended adjuvant therapy with neratinib for 1 year. - Mammogram on 08/18/2017 was BI-RADS Category 1.  She will continue anastrozole.  4.  Osteopenia: -Last DEXA  scan on 06/03/2017 shows osteopenia.  She is continuing calcium and vitamin D. -We will plan to repeat density in March 2021.  Total time spent is 40 minutes with more than 50% of the time spent face-to-face discussing treatment plan, side effects, counseling and coordination of care.    Orders placed this encounter:  Orders Placed This Encounter  Procedures  . CBC with Differential/Platelet  . Comprehensive metabolic panel  . Iron and TIBC  . Ferritin  . Vitamin B12  . Folate      Derek Jack, MD [place] [phone]

## 2018-10-12 MED ORDER — OCTREOTIDE ACETATE 30 MG IM KIT
PACK | INTRAMUSCULAR | Status: AC
  Filled 2018-10-12: qty 2

## 2018-10-12 MED ORDER — OCTREOTIDE ACETATE 20 MG IM KIT
PACK | INTRAMUSCULAR | Status: AC
  Filled 2018-10-12: qty 1

## 2018-10-13 MED ORDER — PEGFILGRASTIM INJECTION 6 MG/0.6ML ~~LOC~~
PREFILLED_SYRINGE | SUBCUTANEOUS | Status: AC
  Filled 2018-10-13: qty 1.2

## 2018-10-14 ENCOUNTER — Inpatient Hospital Stay (HOSPITAL_COMMUNITY): Payer: Medicare Other

## 2018-10-14 ENCOUNTER — Other Ambulatory Visit: Payer: Self-pay

## 2018-10-14 ENCOUNTER — Encounter (HOSPITAL_COMMUNITY): Payer: Self-pay

## 2018-10-14 VITALS — BP 135/59 | HR 60 | Temp 96.9°F | Resp 18

## 2018-10-14 DIAGNOSIS — N184 Chronic kidney disease, stage 4 (severe): Secondary | ICD-10-CM

## 2018-10-14 DIAGNOSIS — Z5111 Encounter for antineoplastic chemotherapy: Secondary | ICD-10-CM | POA: Diagnosis not present

## 2018-10-14 DIAGNOSIS — D509 Iron deficiency anemia, unspecified: Secondary | ICD-10-CM

## 2018-10-14 MED ORDER — PEGFILGRASTIM-CBQV 6 MG/0.6ML ~~LOC~~ SOSY
PREFILLED_SYRINGE | SUBCUTANEOUS | Status: AC
  Filled 2018-10-14: qty 0.6

## 2018-10-14 MED ORDER — SODIUM CHLORIDE 0.9 % IV SOLN
510.0000 mg | Freq: Once | INTRAVENOUS | Status: AC
  Administered 2018-10-14: 510 mg via INTRAVENOUS
  Filled 2018-10-14: qty 510

## 2018-10-14 MED ORDER — SODIUM CHLORIDE 0.9 % IV SOLN
Freq: Once | INTRAVENOUS | Status: AC
  Administered 2018-10-14: 11:00:00 via INTRAVENOUS

## 2018-10-14 NOTE — Progress Notes (Signed)
Adalei Novell Teeple tolerated Feraheme infusion well without complaints or incident. Peripheral IV site with positive blood return noted prior to and after infusion. VSS upon discharge. Pt discharged via wheelchair in satisfactory condition accompanied by her sister

## 2018-10-14 NOTE — Patient Instructions (Signed)
[facility] at [facility] Discharge Instructions  Received Feraheme infusion today. Follow-up as scheduled. Call clinic for any questions or concerns   Thank you for choosing [facility] at [facility] to provide your oncology and hematology care.  To afford each patient quality time with our provider, please arrive at least 15 minutes before your scheduled appointment time.   If you have a lab appointment with the Cancer Center please come in thru the  Main Entrance and check in at the main information desk  You need to re-schedule your appointment should you arrive 10 or more minutes late.  We strive to give you quality time with our providers, and arriving late affects you and other patients whose appointments are after yours.  Also, if you no show three or more times for appointments you may be dismissed from the clinic at the providers discretion.     Again, thank you for choosing [facility].  Our hope is that these requests will decrease the amount of time that you wait before being seen by our physicians.       _____________________________________________________________  Should you have questions after your visit to [facility], please contact our office at [phone] between the hours of 8:00 a.m. and 4:30 p.m.  Voicemails left after 4:00 p.m. will not be returned until the following business day.  For prescription refill requests, have your pharmacy contact our office and allow 72 hours.    Cancer Center Support Programs:   > Cancer Support Group  2nd Tuesday of the month 1pm-2pm, Journey Room   

## 2018-10-18 ENCOUNTER — Other Ambulatory Visit (HOSPITAL_COMMUNITY)
Admission: RE | Admit: 2018-10-18 | Discharge: 2018-10-18 | Disposition: A | Discharge status: Home / Self Care | Payer: Medicare Other | Source: Ambulatory Visit | Attending: Nephrology | Admitting: Nephrology

## 2018-10-18 ENCOUNTER — Inpatient Hospital Stay (HOSPITAL_COMMUNITY): Payer: Medicare Other

## 2018-10-18 ENCOUNTER — Other Ambulatory Visit: Payer: Self-pay

## 2018-10-18 ENCOUNTER — Encounter (HOSPITAL_COMMUNITY): Payer: Self-pay | Admitting: Hematology

## 2018-10-18 ENCOUNTER — Inpatient Hospital Stay (HOSPITAL_BASED_OUTPATIENT_CLINIC_OR_DEPARTMENT_OTHER): Payer: Medicare Other | Admitting: Hematology

## 2018-10-18 DIAGNOSIS — C9000 Multiple myeloma not having achieved remission: Secondary | ICD-10-CM | POA: Diagnosis not present

## 2018-10-18 DIAGNOSIS — D649 Anemia, unspecified: Secondary | ICD-10-CM | POA: Diagnosis present

## 2018-10-18 DIAGNOSIS — E1129 Type 2 diabetes mellitus with other diabetic kidney complication: Secondary | ICD-10-CM | POA: Insufficient documentation

## 2018-10-18 DIAGNOSIS — R809 Proteinuria, unspecified: Secondary | ICD-10-CM | POA: Diagnosis present

## 2018-10-18 DIAGNOSIS — I10 Essential (primary) hypertension: Secondary | ICD-10-CM | POA: Insufficient documentation

## 2018-10-18 DIAGNOSIS — Z5111 Encounter for antineoplastic chemotherapy: Secondary | ICD-10-CM | POA: Diagnosis not present

## 2018-10-18 DIAGNOSIS — E559 Vitamin D deficiency, unspecified: Secondary | ICD-10-CM | POA: Diagnosis present

## 2018-10-18 DIAGNOSIS — N185 Chronic kidney disease, stage 5: Secondary | ICD-10-CM | POA: Diagnosis present

## 2018-10-18 DIAGNOSIS — Z79899 Other long term (current) drug therapy: Secondary | ICD-10-CM | POA: Insufficient documentation

## 2018-10-18 LAB — COMPREHENSIVE METABOLIC PANEL
ALT: 10 U/L (ref 0–44)
AST: 20 U/L (ref 15–41)
Albumin: 3.3 g/dL — ABNORMAL LOW (ref 3.5–5.0)
Alkaline Phosphatase: 44 U/L (ref 38–126)
Anion gap: 10 (ref 5–15)
BUN: 44 mg/dL — ABNORMAL HIGH (ref 8–23)
CO2: 24 mmol/L (ref 22–32)
Calcium: 9 mg/dL (ref 8.9–10.3)
Chloride: 108 mmol/L (ref 98–111)
Creatinine, Ser: 5.22 mg/dL — ABNORMAL HIGH (ref 0.44–1.00)
GFR calc Af Amer: 8 mL/min — ABNORMAL LOW (ref >60)
GFR calc non Af Amer: 7 mL/min — ABNORMAL LOW (ref >60)
Glucose, Bld: 146 mg/dL — ABNORMAL HIGH (ref 70–99)
Potassium: 4.2 mmol/L (ref 3.5–5.1)
Sodium: 142 mmol/L (ref 135–145)
Total Bilirubin: 0.4 mg/dL (ref 0.3–1.2)
Total Protein: 7.1 g/dL (ref 6.5–8.1)

## 2018-10-18 LAB — CBC WITH DIFFERENTIAL/PLATELET
Abs Immature Granulocytes: 0.04 10*3/uL (ref 0.00–0.07)
Basophils Absolute: 0 10*3/uL (ref 0.0–0.1)
Basophils Relative: 0 %
Eosinophils Absolute: 0.1 10*3/uL (ref 0.0–0.5)
Eosinophils Relative: 2 %
HCT: 26.5 % — ABNORMAL LOW (ref 36.0–46.0)
Hemoglobin: 8.6 g/dL — ABNORMAL LOW (ref 12.0–15.0)
Immature Granulocytes: 1 %
Lymphocytes Relative: 9 %
Lymphs Abs: 0.7 10*3/uL (ref 0.7–4.0)
MCH: 30.4 pg (ref 26.0–34.0)
MCHC: 32.5 g/dL (ref 30.0–36.0)
MCV: 93.6 fL (ref 80.0–100.0)
Monocytes Absolute: 0.4 10*3/uL (ref 0.1–1.0)
Monocytes Relative: 5 %
Neutro Abs: 6.8 10*3/uL (ref 1.7–7.7)
Neutrophils Relative %: 83 %
Platelets: 228 10*3/uL (ref 150–400)
RBC: 2.83 MIL/uL — ABNORMAL LOW (ref 3.87–5.11)
RDW: 15.4 % (ref 11.5–15.5)
WBC: 8.1 10*3/uL (ref 4.0–10.5)
nRBC: 0 % (ref 0.0–0.2)

## 2018-10-18 LAB — IRON AND TIBC
Iron: 147 ug/dL (ref 28–170)
Saturation Ratios: 98 % — ABNORMAL HIGH (ref 10.4–31.8)
TIBC: 150 ug/dL — ABNORMAL LOW (ref 250–450)
UIBC: 3 ug/dL

## 2018-10-18 LAB — VITAMIN B12: Vitamin B-12: 594 pg/mL (ref 180–914)

## 2018-10-18 LAB — PROTEIN / CREATININE RATIO, URINE
Creatinine, Urine: 176.03 mg/dL
Protein Creatinine Ratio: 2.94 mg/mg{Cre} — ABNORMAL HIGH (ref 0.00–0.15)
Total Protein, Urine: 517 mg/dL

## 2018-10-18 LAB — FERRITIN: Ferritin: 347 ng/mL — ABNORMAL HIGH (ref 11–307)

## 2018-10-18 LAB — PHOSPHORUS: Phosphorus: 4 mg/dL (ref 2.5–4.6)

## 2018-10-18 LAB — FOLATE: Folate: 40.6 ng/mL (ref >5.9)

## 2018-10-18 MED ORDER — DEXAMETHASONE 4 MG PO TABS
10.0000 mg | ORAL_TABLET | Freq: Once | ORAL | Status: AC
  Administered 2018-10-18: 10 mg via ORAL
  Filled 2018-10-18: qty 3

## 2018-10-18 MED ORDER — PROCHLORPERAZINE MALEATE 10 MG PO TABS
10.0000 mg | ORAL_TABLET | Freq: Once | ORAL | Status: AC
  Administered 2018-10-18: 10 mg via ORAL
  Filled 2018-10-18: qty 1

## 2018-10-18 MED ORDER — OCTREOTIDE ACETATE 30 MG IM KIT
PACK | INTRAMUSCULAR | Status: AC
  Filled 2018-10-18: qty 1

## 2018-10-18 MED ORDER — BORTEZOMIB CHEMO SQ INJECTION 3.5 MG (2.5MG/ML)
1.3000 mg/m2 | Freq: Once | INTRAMUSCULAR | Status: AC
  Administered 2018-10-18: 2.5 mg via SUBCUTANEOUS
  Filled 2018-10-18: qty 1

## 2018-10-18 NOTE — Progress Notes (Signed)
[address], [place] [id number]   CLINIC:  Medical Oncology/Hematology  PCP:  Monico Blitz, [place] [address] [phone]   REASON FOR VISIT:  Follow-up for plasma cell myeloma and anemia.  CURRENT THERAPY: Velcade, dexamethasone, Procrit and Feraheme.  BRIEF ONCOLOGIC HISTORY:  Oncology History  Stage 1 infiltrating ductal carcinoma of right female breast ([place])  09/12/2014 Mammogram   Mass in upper outer R breast, middle third depth appears slightly larger than it was on prior exam with more irreg spiculated margins   10/02/2014 Pathology Results   biopsy with invasive ductal carcinoma high grade 1.1 cm, dcis solid type. additional R breast tissue, excision, invasive ductal high grade 0.9 cm    10/24/2014 Pathology Results   no residual invasive carcinoma, DCIS, focal, atypical ductal hyperplasia, 0/7 LN positive for metastatic carcinoma ER > 90%, PR 30%, HER 2 2+   10/24/2014 Cancer Staging   T1cN0M0   10/24/2014 Surgery   R mastectomy, T1c, N0   12/28/2014 - 11/22/2015 Chemotherapy   Taxol/herceptin weekly X 12, Herceptin every 21 days, last due in September 2017   03/11/2015 -  Anti-estrogen oral therapy   Arimidex 1 mg daily   09/12/2015 Imaging   MUGA- The left ventricular ejection fraction equals 68%.   06/08/2016 Treatment Plan Change   Started Nerlynx   Multiple myeloma ([place])  10/04/2018 Initial Diagnosis   Multiple myeloma ([place])   10/11/2018 -  Chemotherapy   The patient had bortezomib SQ (VELCADE) chemo injection 2.5 mg, 1.3 mg/m2 = 2.5 mg, Subcutaneous,  Once, 1 of 8 cycles Administration: 2.5 mg (10/11/2018)  for chemotherapy treatment.       CANCER STAGING: Cancer Staging Stage 1 infiltrating ductal carcinoma of right female breast [facility]) Staging form: Breast, AJCC 7th Edition - Clinical stage from 08/30/2015: Stage IA (T1c, N0, M0) - Signed by Baird Cancer, PA-C on 08/30/2015    INTERVAL HISTORY:   Ms. Asby 83 y.o. female seen for follow-up of multiple myeloma and anemia.  She received first treatment of Velcade on 10/11/2018 and first Feraheme infusion on 10/14/2018.  She tolerated Velcade very well.  She did not experience any nausea and vomiting at home.  However she had one episode of nausea this morning.  Denies any fevers or chills.  She did not have any allergic reactions to Feraheme.  Appetite and energy levels are 75%.  Denies any diarrhea or constipation.  No bleeding per rectum or melena.  REVIEW OF SYSTEMS:  Review of Systems  Cardiovascular: Positive for leg swelling.  Gastrointestinal: Positive for nausea.  All other systems reviewed and are negative.    PAST MEDICAL/SURGICAL HISTORY:  Past Medical History:  Diagnosis Date  . Anemia   . Anxiety   . Chronic kidney disease   . Chronic renal disease, stage 4, severely decreased glomerular filtration rate (GFR) between 15-29 mL/min/1.73 square meter (HCC) 08/22/2015  . Complication of anesthesia    delirious after Breast Surgery  . Dementia ([place])    mild  . Depression   . Diabetes mellitus without complication ([place])    type II  . Dyspnea    with activity  . GERD (gastroesophageal reflux disease)   . Glaucoma   . Hypertension   . Pneumonia   . Stage 1 infiltrating ductal carcinoma of right female breast ([place]) 08/21/2015   ER+ PR+ HER 2 neu + (3+) T1cN0    Past Surgical History:  Procedure Laterality  Date  . AV FISTULA PLACEMENT Left 11/22/2017   Procedure: ARTERIOVENOUS (AV) FISTULA CREATION LEFT ARM;  Surgeon: Elam Dutch, MD;  Location: [facility];  Service: Vascular;  Laterality: Left;  . BIOPSY  08/07/2016   Procedure: BIOPSY;  Surgeon: Daneil Dolin, MD;  [facility];  Service: Endoscopy;;  gastric ulcer biopsy  . COLONOSCOPY    . ESOPHAGOGASTRODUODENOSCOPY N/A 08/07/2016   Procedure: ESOPHAGOGASTRODUODENOSCOPY (EGD);  Surgeon: Daneil Dolin, MD;  [facility];  Service: Endoscopy;   Laterality: N/A;  1215-rescheduled to 6/1 @ 2:30pm per Tretha Sciara  . ESOPHAGOGASTRODUODENOSCOPY N/A 11/27/2016   Procedure: ESOPHAGOGASTRODUODENOSCOPY (EGD);  Surgeon: Daneil Dolin, MD;  [facility];  Service: Endoscopy;  Laterality: N/A;  8:15am  . FISTULA SUPERFICIALIZATION Left 02/14/2018   Procedure: FISTULA SUPERFICIALIZATION LEFT ARM;  Surgeon: Angelia Mould, MD;  Location: [place] [facility];  Service: Vascular;  Laterality: Left;  . FRACTURE SURGERY Right    ankle  . MALONEY DILATION N/A 08/07/2016   Procedure: Venia Minks DILATION;  Surgeon: Daneil Dolin, MD;  [facility];  Service: Endoscopy;  Laterality: N/A;  . MASTECTOMY, PARTIAL Right   . PORT-A-CATH REMOVAL Left 11/22/2017   Procedure: REMOVAL PORT-A-CATH LEFT CHEST;  Surgeon: Elam Dutch, MD;  Location: [facility];  Service: Vascular;  Laterality: Left;  . RETINAL DETACHMENT SURGERY Right      SOCIAL HISTORY:  Social History   Socioeconomic History  . Marital status: Single    Spouse name: Not on file  . Number of children: Not on file  . Years of education: Not on file  . Highest education level: Not on file  Occupational History  . Not on file  Social Needs  . Financial resource strain: Not on file  . Food insecurity    Worry: Not on file    Inability: Not on file  . Transportation needs    Medical: Not on file    Non-medical: Not on file  Tobacco Use  . Smoking status: Never Smoker  . Smokeless tobacco: Never Used  Substance and Sexual Activity  . Alcohol use: No    Alcohol/week: 0.0 standard drinks  . Drug use: No  . Sexual activity: Not on file  Lifestyle  . Physical activity    Days per week: Not on file    Minutes per session: Not on file  . Stress: Not on file  Relationships  . Social Herbalist on phone: Not on file    Gets together: Not on file    Attends religious service: Not on file    Active member of club or organization: Not on file    Attends meetings of  clubs or organizations: Not on file    Relationship status: Not on file  . Intimate partner violence    Fear of current or ex partner: Not on file    Emotionally abused: Not on file    Physically abused: Not on file    Forced sexual activity: Not on file  Other Topics Concern  . Not on file  Social History Narrative  . Not on file    FAMILY HISTORY:  Family History  Problem Relation Age of Onset  . Multiple myeloma Sister   . Brain cancer Sister   . Dementia Mother        died at 38  . Stroke Mother   . Heart failure Mother   . Diabetes Mother   .  Heart disease Father   . Prostate cancer Brother   . Colon cancer Neg Hx     CURRENT MEDICATIONS:  Outpatient Encounter Medications as of 10/18/2018  Medication Sig Note  . ACCU-CHEK AVIVA PLUS test strip    . acetaminophen (TYLENOL) 500 MG tablet Take 500 mg by mouth every 6 (six) hours as needed.    . Alcohol Swabs (ALCOHOL PREP) 70 % PADS See admin instructions.   Marland Kitchen amLODipine (NORVASC) 10 MG tablet Take 10 mg by mouth daily.   Marland Kitchen anastrozole (ARIMIDEX) 1 MG tablet TAKE 1 TABLET BY MOUTH DAILY   . anti-nausea (EMETROL) solution Take 10 mLs by mouth every 15 (fifteen) minutes as needed for nausea.    Marland Kitchen AQUALANCE LANCETS 30G MISC USE TO test twice daily   . aspirin EC 81 MG tablet Take 81 mg by mouth daily.   . bortezomib IV (VELCADE) 3.5 MG injection Inject into the vein once a week. Weekly every 21 days   . calcitRIOL (ROCALTROL) 0.25 MCG capsule Take 0.25 mcg by mouth daily at 12 noon.    . calcium-vitamin D (OSCAL 500/200 D-3) 500-200 MG-UNIT tablet Take 1 tablet by mouth daily at 12 noon.    . cloNIDine (CATAPRES) 0.3 MG tablet Take 0.3 mg by mouth 2 (two) times daily.    Marland Kitchen epoetin alfa (EPOGEN,PROCRIT) 4000 UNIT/ML injection Inject 4,000 Units into the vein.   . iron polysaccharides (NIFEREX) 150 MG capsule Take 150 mg by mouth daily at 12 noon.    Elmore Guise Devices (ADJUSTABLE LANCING DEVICE) MISC TO check blood glucose    . Multiple Vitamin (MULTIVITAMIN) capsule Take 1 capsule by mouth daily.   . ONGLYZA 5 MG TABS tablet Take 5 mg by mouth daily.   Marland Kitchen PARoxetine (PAXIL) 20 MG tablet Take 20 mg by mouth daily.    . pioglitazone (ACTOS) 30 MG tablet Take 15 mg by mouth daily.    . pravastatin (PRAVACHOL) 20 MG tablet Take 20 mg by mouth every evening.    Marland Kitchen TOUJEO SOLOSTAR 300 UNIT/ML SOPN INJECT FIVE UNITS INTO THE SKIN DAILY (270 DAY SUPPLY)   . traZODone (DESYREL) 100 MG tablet Take 100 mg by mouth at bedtime.   Marland Kitchen acyclovir (ZOVIRAX) 200 MG capsule Take 1 capsule (200 mg total) by mouth 2 (two) times daily. 10/11/2018: Pt hasn't not started taking yet   No facility-administered encounter medications on file as of 10/18/2018.     ALLERGIES:  Allergies  Allergen Reactions  . Penicillins Other (See Comments)    Unsure of reaction Has patient had a PCN reaction causing immediate rash, facial/tongue/throat swelling, SOB or lightheadedness with hypotension: Unknown Has patient had a PCN reaction causing severe rash involving mucus membranes or skin necrosis: Unknown Has patient had a PCN reaction that required hospitalization: No Has patient had a PCN reaction occurring within the last 10 years: Unknown If all of the above answers are "NO", then may proceed with Cephalosporin use.    . Ace Inhibitors Cough and Other (See Comments)    Tongue swell  . Lisinopril Other (See Comments)    Tongue swell     PHYSICAL EXAM:  ECOG Performance status: 2  Vitals:   10/18/18 1154  BP: (!) 142/58  Pulse: 66  Resp: 18  Temp: (!) 97.3 F (36.3 C)  SpO2: 100%   Filed Weights   10/18/18 1154  Weight: 170 lb 1.6 oz (77.2 kg)    Physical Exam Constitutional:  Appearance: Normal appearance. She is obese.  HENT:     Head: Normocephalic.     Nose: Nose normal.     Mouth/Throat:     Mouth: Mucous membranes are moist.     Pharynx: Oropharynx is clear.  Eyes:     Extraocular Movements: Extraocular  movements intact.     Conjunctiva/sclera: Conjunctivae normal.  Neck:     Musculoskeletal: Normal range of motion.  Cardiovascular:     Rate and Rhythm: Normal rate and regular rhythm.     Pulses: Normal pulses.     Heart sounds: Normal heart sounds.  Pulmonary:     Effort: Pulmonary effort is normal.     Breath sounds: Normal breath sounds.  Abdominal:     General: Bowel sounds are normal.     Palpations: Abdomen is soft.  Musculoskeletal:     Right lower leg: Edema present.     Left lower leg: Edema present.  Skin:    General: Skin is warm and dry.  Neurological:     General: No focal deficit present.     Mental Status: She is alert and oriented to person, place, and time.  Psychiatric:        Mood and Affect: Mood normal.        Behavior: Behavior normal.        Thought Content: Thought content normal.        Judgment: Judgment normal.      LABORATORY DATA:  I have reviewed the labs as listed.  CBC    Component Value Date/Time   WBC 8.1 10/18/2018 1052   RBC 2.83 (L) 10/18/2018 1052   HGB 8.6 (L) 10/18/2018 1052   HCT 26.5 (L) 10/18/2018 1052   PLT 228 10/18/2018 1052   MCV 93.6 10/18/2018 1052   MCH 30.4 10/18/2018 1052   MCHC 32.5 10/18/2018 1052   RDW 15.4 10/18/2018 1052   LYMPHSABS 0.7 10/18/2018 1052   MONOABS 0.4 10/18/2018 1052   EOSABS 0.1 10/18/2018 1052   BASOSABS 0.0 10/18/2018 1052   CMP Latest Ref Rng & Units 10/18/2018 10/11/2018 08/29/2018  Glucose 70 - 99 mg/dL 146(H) 185(H) 118(H)  BUN 8 - 23 mg/dL 44(H) 42(H) 40(H)  Creatinine 0.44 - 1.00 mg/dL 5.22(H) 5.36(H) 4.96(H)  Sodium 135 - 145 mmol/L 142 140 142  Potassium 3.5 - 5.1 mmol/L 4.2 4.0 4.3  Chloride 98 - 111 mmol/L 108 106 105  CO2 22 - 32 mmol/L _0 Calcium 8.9 - 10.3 mg/dL 9.0 8.7(L) 9.2  Total Protein 6.5 - 8.1 g/dL 7.1 7.2 7.8  Total Bilirubin 0.3 - 1.2 mg/dL 0.4 0.5 0.3  Alkaline Phos 38 - 126 U/L 44 47 53  AST 15 - 41 U/L _1 ALT 0 - 44 U/L _2 I have  reviewed her DEXA scan from 2019.     ASSESSMENT & PLAN:   Multiple myeloma ([place]) 1.  IgA lambda plasma cell myeloma, stage II (R-iss), standard risk: - Labs on 08/29/2018 shows M spike of 0.9 g/dL, kappa light chains 148, lambda light chains 1132, ratio 0.13.  LDH is normal.  Beta-2 microglobulin 13.5. - She was referred by Dr. Lowanda Foster secondary to worsening renal function. - Bone marrow biopsy on 09/20/2018 shows plasma cell myeloma with 30% plasma cells, lambda restricted.  FISH panel was normal.  Chromosome analysis was normal. - She has transfusion dependent anemia, last transfusion on 09/13/2018. - She was started on 2 drug regimen  with Velcade and dexamethasone due to her poor functional status.  She received first treatment on 10/11/2018. -She has tolerated first treatment very well.  She felt mildly nauseous this morning.  Her creatinine improved to 5.22. -She will continue aspirin 81 mg daily.  We will consider starting her on acyclovir once her renal function slightly improved.  2.  Normocytic anemia: -Combination anemia from CKD, plasma cell myeloma and iron deficiency. - Ferritin was 31% saturation of 15 on 09/13/2018.  Feraheme infusion started on 10/14/2018. -She is tolerated iron infusion very well.  She will proceed with her second infusion today. - We will also consider starting her on Procrit 10,000 units every week after her second infusion of iron today.  3.  Stage I IDC of the right breast, ER/PR positive, HER-2 positive: - She will continue anastrozole.  4.  Osteopenia: -DEXA scan on 3 11/03/2017 shows osteopenia.  She will continue calcium and vitamin D. -We will plan to repeat bone density in March 2021.  Total time spent is 25 minutes with more than 50% of the time spent face-to-face discussing treatment plan, counseling and coordination of care.    Orders placed this encounter:  No orders of the defined types were placed in this encounter.     Derek Jack, MD [place] [phone]

## 2018-10-18 NOTE — Assessment & Plan Note (Signed)
1.  IgA lambda plasma cell myeloma, stage II (R-iss), standard risk: - Labs on 08/29/2018 shows M spike of 0.9 g/dL, kappa light chains 148, lambda light chains 1132, ratio 0.13.  LDH is normal.  Beta-2 microglobulin 13.5. - She was referred by Dr. Lowanda Foster secondary to worsening renal function. - Bone marrow biopsy on 09/20/2018 shows plasma cell myeloma with 30% plasma cells, lambda restricted.  FISH panel was normal.  Chromosome analysis was normal. - She has transfusion dependent anemia, last transfusion on 09/13/2018. - She was started on 2 drug regimen with Velcade and dexamethasone due to her poor functional status.  She received first treatment on 10/11/2018. -She has tolerated first treatment very well.  She felt mildly nauseous this morning.  Her creatinine improved to 5.22. -She will continue aspirin 81 mg daily.  We will consider starting her on acyclovir once her renal function slightly improved.  2.  Normocytic anemia: -Combination anemia from CKD, plasma cell myeloma and iron deficiency. - Ferritin was 31% saturation of 15 on 09/13/2018.  Feraheme infusion started on 10/14/2018. -She is tolerated iron infusion very well.  She will proceed with her second infusion today. - We will also consider starting her on Procrit 10,000 units every week after her second infusion of iron today.  3.  Stage I IDC of the right breast, ER/PR positive, HER-2 positive: - She will continue anastrozole.  4.  Osteopenia: -DEXA scan on 3 11/03/2017 shows osteopenia.  She will continue calcium and vitamin D. -We will plan to repeat bone density in March 2021.

## 2018-10-18 NOTE — Patient Instructions (Signed)
[facility] at [facility] Discharge Instructions  You were seen today by Dr. Katragadda. He went over your recent lab results. He will see you back in 2 weeks for labs and follow up.   Thank you for choosing [facility] at [facility] to provide your oncology and hematology care.  To afford each patient quality time with our provider, please arrive at least 15 minutes before your scheduled appointment time.   If you have a lab appointment with the Cancer Center please come in thru the  Main Entrance and check in at the main information desk  You need to re-schedule your appointment should you arrive 10 or more minutes late.  We strive to give you quality time with our providers, and arriving late affects you and other patients whose appointments are after yours.  Also, if you no show three or more times for appointments you may be dismissed from the clinic at the providers discretion.     Again, thank you for choosing [facility].  Our hope is that these requests will decrease the amount of time that you wait before being seen by our physicians.       _____________________________________________________________  Should you have questions after your visit to [facility], please contact our office at [phone] between the hours of 8:00 a.m. and 4:30 p.m.  Voicemails left after 4:00 p.m. will not be returned until the following business day.  For prescription refill requests, have your pharmacy contact our office and allow 72 hours.    Cancer Center Support Programs:   > Cancer Support Group  2nd Tuesday of the month 1pm-2pm, Journey Room    

## 2018-10-18 NOTE — Progress Notes (Signed)
Unable to get IV access today for iron infusion. Patient's sister requested to come back another day and get her iron.   Vitals stable and discharged home from clinic wheelchair.. Follow up as scheduled.

## 2018-10-18 NOTE — Progress Notes (Signed)
Patient seen by Dr. Delton Coombes with lab review and ok to treat verbal order.    Patient tolerated injection with no complaints voiced.  Site clean and dry with no bruising or swelling noted at site.  Band aid applied.  Vss with discharge and left ambulatory with no s/s of distress noted.

## 2018-10-19 LAB — PTH, INTACT AND CALCIUM
Calcium, Total (PTH): 9 mg/dL (ref 8.7–10.3)
PTH: 46 pg/mL (ref 15–65)

## 2018-10-19 LAB — VITAMIN D 25 HYDROXY (VIT D DEFICIENCY, FRACTURES): Vit D, 25-Hydroxy: 43.8 ng/mL (ref 30.0–100.0)

## 2018-10-19 MED ORDER — LANREOTIDE ACETATE 120 MG/0.5ML ~~LOC~~ SOLN
SUBCUTANEOUS | Status: AC
  Filled 2018-10-19: qty 120

## 2018-10-19 NOTE — Addendum Note (Signed)
Addended by: Henreitta Leber E on: 10/19/2018 11:50 AM   Modules accepted: Orders

## 2018-10-20 MED ORDER — PEGFILGRASTIM INJECTION 6 MG/0.6ML ~~LOC~~
PREFILLED_SYRINGE | SUBCUTANEOUS | Status: AC
  Filled 2018-10-20: qty 0.6

## 2018-10-21 ENCOUNTER — Ambulatory Visit (HOSPITAL_COMMUNITY): Payer: Medicare Other

## 2018-10-21 MED ORDER — OCTREOTIDE ACETATE 30 MG IM KIT
PACK | INTRAMUSCULAR | Status: AC
  Filled 2018-10-21: qty 1

## 2018-10-25 ENCOUNTER — Encounter (HOSPITAL_COMMUNITY): Payer: Self-pay

## 2018-10-25 ENCOUNTER — Ambulatory Visit (HOSPITAL_COMMUNITY): Payer: Medicare Other

## 2018-10-25 ENCOUNTER — Encounter (HOSPITAL_COMMUNITY)
Admission: RE | Admit: 2018-10-25 | Discharge: 2018-10-25 | Disposition: A | Discharge status: Home / Self Care | Payer: Medicare Other | Source: Ambulatory Visit | Attending: Nephrology | Admitting: Nephrology

## 2018-10-25 ENCOUNTER — Other Ambulatory Visit (HOSPITAL_COMMUNITY): Payer: Medicare Other

## 2018-10-25 ENCOUNTER — Other Ambulatory Visit: Payer: Self-pay

## 2018-10-25 ENCOUNTER — Ambulatory Visit (HOSPITAL_COMMUNITY): Payer: Medicare Other | Admitting: Hematology

## 2018-10-25 ENCOUNTER — Inpatient Hospital Stay (HOSPITAL_COMMUNITY): Payer: Medicare Other

## 2018-10-25 VITALS — BP 149/63 | HR 70 | Temp 96.7°F | Resp 18

## 2018-10-25 DIAGNOSIS — D509 Iron deficiency anemia, unspecified: Secondary | ICD-10-CM | POA: Diagnosis not present

## 2018-10-25 DIAGNOSIS — C9000 Multiple myeloma not having achieved remission: Secondary | ICD-10-CM

## 2018-10-25 DIAGNOSIS — Z5111 Encounter for antineoplastic chemotherapy: Secondary | ICD-10-CM | POA: Diagnosis not present

## 2018-10-25 DIAGNOSIS — N184 Chronic kidney disease, stage 4 (severe): Secondary | ICD-10-CM

## 2018-10-25 LAB — CBC WITH DIFFERENTIAL/PLATELET
Abs Immature Granulocytes: 0.02 10*3/uL (ref 0.00–0.07)
Basophils Absolute: 0.1 10*3/uL (ref 0.0–0.1)
Basophils Relative: 1 %
Eosinophils Absolute: 0.2 10*3/uL (ref 0.0–0.5)
Eosinophils Relative: 3 %
HCT: 27.6 % — ABNORMAL LOW (ref 36.0–46.0)
Hemoglobin: 8.6 g/dL — ABNORMAL LOW (ref 12.0–15.0)
Immature Granulocytes: 0 %
Lymphocytes Relative: 10 %
Lymphs Abs: 0.7 10*3/uL (ref 0.7–4.0)
MCH: 29.2 pg (ref 26.0–34.0)
MCHC: 31.2 g/dL (ref 30.0–36.0)
MCV: 93.6 fL (ref 80.0–100.0)
Monocytes Absolute: 0.3 10*3/uL (ref 0.1–1.0)
Monocytes Relative: 5 %
Neutro Abs: 5.6 10*3/uL (ref 1.7–7.7)
Neutrophils Relative %: 81 %
Platelets: 203 10*3/uL (ref 150–400)
RBC: 2.95 MIL/uL — ABNORMAL LOW (ref 3.87–5.11)
RDW: 15.9 % — ABNORMAL HIGH (ref 11.5–15.5)
WBC: 6.8 10*3/uL (ref 4.0–10.5)
nRBC: 0 % (ref 0.0–0.2)

## 2018-10-25 LAB — COMPREHENSIVE METABOLIC PANEL
ALT: 10 U/L (ref 0–44)
AST: 18 U/L (ref 15–41)
Albumin: 3.3 g/dL — ABNORMAL LOW (ref 3.5–5.0)
Alkaline Phosphatase: 47 U/L (ref 38–126)
Anion gap: 11 (ref 5–15)
BUN: 40 mg/dL — ABNORMAL HIGH (ref 8–23)
CO2: 22 mmol/L (ref 22–32)
Calcium: 8.5 mg/dL — ABNORMAL LOW (ref 8.9–10.3)
Chloride: 106 mmol/L (ref 98–111)
Creatinine, Ser: 5.19 mg/dL — ABNORMAL HIGH (ref 0.44–1.00)
GFR calc Af Amer: 8 mL/min — ABNORMAL LOW (ref >60)
GFR calc non Af Amer: 7 mL/min — ABNORMAL LOW (ref >60)
Glucose, Bld: 169 mg/dL — ABNORMAL HIGH (ref 70–99)
Potassium: 3.6 mmol/L (ref 3.5–5.1)
Sodium: 139 mmol/L (ref 135–145)
Total Bilirubin: 0.5 mg/dL (ref 0.3–1.2)
Total Protein: 7.1 g/dL (ref 6.5–8.1)

## 2018-10-25 LAB — POCT HEMOGLOBIN-HEMACUE: Hemoglobin: 8.4 g/dL — ABNORMAL LOW (ref 12.0–15.0)

## 2018-10-25 MED ORDER — DEXAMETHASONE 4 MG PO TABS
10.0000 mg | ORAL_TABLET | Freq: Once | ORAL | Status: AC
  Administered 2018-10-25: 10 mg via ORAL
  Filled 2018-10-25: qty 3

## 2018-10-25 MED ORDER — PROCHLORPERAZINE MALEATE 10 MG PO TABS
10.0000 mg | ORAL_TABLET | Freq: Once | ORAL | Status: AC
  Administered 2018-10-25: 10 mg via ORAL
  Filled 2018-10-25: qty 1

## 2018-10-25 MED ORDER — SODIUM CHLORIDE 0.9 % IV SOLN
510.0000 mg | Freq: Once | INTRAVENOUS | Status: AC
  Administered 2018-10-25: 510 mg via INTRAVENOUS
  Filled 2018-10-25: qty 510

## 2018-10-25 MED ORDER — BORTEZOMIB CHEMO SQ INJECTION 3.5 MG (2.5MG/ML)
1.3000 mg/m2 | Freq: Once | INTRAMUSCULAR | Status: AC
  Administered 2018-10-25: 13:00:00 2.5 mg via SUBCUTANEOUS
  Filled 2018-10-25: qty 1

## 2018-10-25 MED ORDER — SODIUM CHLORIDE 0.9 % IV SOLN
Freq: Once | INTRAVENOUS | Status: AC
  Administered 2018-10-25: 12:00:00 via INTRAVENOUS

## 2018-10-25 MED ORDER — EPOETIN ALFA 4000 UNIT/ML IJ SOLN
4000.0000 [IU] | Freq: Once | INTRAMUSCULAR | Status: AC
  Administered 2018-10-25: 4000 [IU] via SUBCUTANEOUS
  Filled 2018-10-25: qty 1

## 2018-10-25 MED ORDER — SODIUM CHLORIDE 0.9% FLUSH
10.0000 mL | Freq: Once | INTRAVENOUS | Status: AC | PRN
  Administered 2018-10-25: 10 mL

## 2018-10-25 NOTE — Patient Instructions (Signed)
[facility] Discharge Instructions for Patients Receiving Chemotherapy  Today you received the following chemotherapy agents   To help prevent nausea and vomiting after your treatment, we encourage you to take your nausea medication   If you develop nausea and vomiting that is not controlled by your nausea medication, call the clinic.   BELOW ARE SYMPTOMS THAT SHOULD BE REPORTED IMMEDIATELY:  *FEVER GREATER THAN 100.5 F  *CHILLS WITH OR WITHOUT FEVER  NAUSEA AND VOMITING THAT IS NOT CONTROLLED WITH YOUR NAUSEA MEDICATION  *UNUSUAL SHORTNESS OF BREATH  *UNUSUAL BRUISING OR BLEEDING  TENDERNESS IN MOUTH AND THROAT WITH OR WITHOUT PRESENCE OF ULCERS  *URINARY PROBLEMS  *BOWEL PROBLEMS  UNUSUAL RASH Items with * indicate a potential emergency and should be followed up as soon as possible.  Feel free to call the clinic should you have any questions or concerns. The clinic phone number is [phone].  Please show the CHEMO ALERT CARD at check-in to the Emergency Department and triage nurse.   

## 2018-10-25 NOTE — Progress Notes (Signed)
Pt presents today for velcade injection and Feraheme. VSS. Pt has no complaints of any changes since the last visit. MAR reviewed and updated. Pt arrived with a 22 g hep lock in her R AC that was used for Iron administration.   Velcade administered today per MD orders. Feraheme infused administered today without adverse affects. Vital signs stable. No complaints at this time. Discharged from clinic ambulatory. F/U with [facility] as scheduled.

## 2018-10-26 ENCOUNTER — Telehealth (HOSPITAL_COMMUNITY): Payer: Self-pay | Admitting: *Deleted

## 2018-10-26 ENCOUNTER — Other Ambulatory Visit (HOSPITAL_COMMUNITY): Payer: Self-pay | Admitting: Nurse Practitioner

## 2018-10-26 NOTE — Progress Notes (Signed)
Patient's sister called @ 1300 to inform me that patient's nose was bleeding and she could not get it stopped.  Advised that she was having clots from her nose, as well.  Informed her that if she could not control the bleeding she would need to be seen in the ER,  Verbalized understanding.  Lynnell Dike, RN notified, as well since she had also had treatments in the cancer center.

## 2018-10-26 NOTE — Telephone Encounter (Signed)
Patient's sister Deneise Lever called into clinic saying the patient has a continuous nose bleed  that continued over night into the morning. Pt had treatment in the clinic yesterday. Provider made aware and stated to call and get more information about patient's nosebleed provider also stated to tell the patient to hold head back and pinch nose, ask about the amount of blood bleeding from patient's nose, and ask if she's having any lightheadedness or dizziness. Called patient's sister back to get more information about the patient's nosebleed but sister had already called EMS and said the nosebleed had stopped after EMS held her head back and pinched her nose. I told the patient's sister to call back if the nosebleed  started back and she verbalized understanding.

## 2018-10-27 MED ORDER — PEGFILGRASTIM INJECTION 6 MG/0.6ML ~~LOC~~
PREFILLED_SYRINGE | SUBCUTANEOUS | Status: AC
  Filled 2018-10-27: qty 0.6

## 2018-10-28 MED ORDER — FULVESTRANT 250 MG/5ML IM SOLN
INTRAMUSCULAR | Status: AC
  Filled 2018-10-28: qty 5

## 2018-11-01 ENCOUNTER — Encounter (HOSPITAL_COMMUNITY): Payer: Self-pay | Admitting: *Deleted

## 2018-11-01 ENCOUNTER — Inpatient Hospital Stay (HOSPITAL_COMMUNITY): Payer: Medicare Other | Admitting: Hematology

## 2018-11-01 ENCOUNTER — Inpatient Hospital Stay (HOSPITAL_COMMUNITY): Payer: Medicare Other

## 2018-11-01 ENCOUNTER — Other Ambulatory Visit: Payer: Self-pay

## 2018-11-01 ENCOUNTER — Encounter (HOSPITAL_COMMUNITY): Payer: Self-pay | Admitting: Hematology

## 2018-11-01 ENCOUNTER — Other Ambulatory Visit (HOSPITAL_COMMUNITY): Payer: Medicare Other

## 2018-11-01 VITALS — BP 123/51 | HR 75 | Temp 97.1°F | Resp 16 | Wt 166.5 lb

## 2018-11-01 DIAGNOSIS — C9000 Multiple myeloma not having achieved remission: Secondary | ICD-10-CM

## 2018-11-01 DIAGNOSIS — D509 Iron deficiency anemia, unspecified: Secondary | ICD-10-CM

## 2018-11-01 DIAGNOSIS — Z5111 Encounter for antineoplastic chemotherapy: Secondary | ICD-10-CM | POA: Diagnosis not present

## 2018-11-01 DIAGNOSIS — N184 Chronic kidney disease, stage 4 (severe): Secondary | ICD-10-CM

## 2018-11-01 LAB — COMPREHENSIVE METABOLIC PANEL
ALT: 10 U/L (ref 0–44)
AST: 19 U/L (ref 15–41)
Albumin: 3.3 g/dL — ABNORMAL LOW (ref 3.5–5.0)
Alkaline Phosphatase: 44 U/L (ref 38–126)
Anion gap: 9 (ref 5–15)
BUN: 46 mg/dL — ABNORMAL HIGH (ref 8–23)
CO2: 23 mmol/L (ref 22–32)
Calcium: 8.6 mg/dL — ABNORMAL LOW (ref 8.9–10.3)
Chloride: 108 mmol/L (ref 98–111)
Creatinine, Ser: 5.27 mg/dL — ABNORMAL HIGH (ref 0.44–1.00)
GFR calc Af Amer: 8 mL/min — ABNORMAL LOW (ref >60)
GFR calc non Af Amer: 7 mL/min — ABNORMAL LOW (ref >60)
Glucose, Bld: 196 mg/dL — ABNORMAL HIGH (ref 70–99)
Potassium: 3.8 mmol/L (ref 3.5–5.1)
Sodium: 140 mmol/L (ref 135–145)
Total Bilirubin: 0.3 mg/dL (ref 0.3–1.2)
Total Protein: 6.3 g/dL — ABNORMAL LOW (ref 6.5–8.1)

## 2018-11-01 LAB — CBC WITH DIFFERENTIAL/PLATELET
Abs Immature Granulocytes: 0.05 10*3/uL (ref 0.00–0.07)
Basophils Absolute: 0 10*3/uL (ref 0.0–0.1)
Basophils Relative: 0 %
Eosinophils Absolute: 0.1 10*3/uL (ref 0.0–0.5)
Eosinophils Relative: 2 %
HCT: 24.1 % — ABNORMAL LOW (ref 36.0–46.0)
Hemoglobin: 7.4 g/dL — ABNORMAL LOW (ref 12.0–15.0)
Immature Granulocytes: 1 %
Lymphocytes Relative: 10 %
Lymphs Abs: 0.7 10*3/uL (ref 0.7–4.0)
MCH: 30.5 pg (ref 26.0–34.0)
MCHC: 30.7 g/dL (ref 30.0–36.0)
MCV: 99.2 fL (ref 80.0–100.0)
Monocytes Absolute: 0.4 10*3/uL (ref 0.1–1.0)
Monocytes Relative: 6 %
Neutro Abs: 5.8 10*3/uL (ref 1.7–7.7)
Neutrophils Relative %: 81 %
Platelets: 201 10*3/uL (ref 150–400)
RBC: 2.43 MIL/uL — ABNORMAL LOW (ref 3.87–5.11)
RDW: 17.4 % — ABNORMAL HIGH (ref 11.5–15.5)
WBC: 7.1 10*3/uL (ref 4.0–10.5)
nRBC: 0 % (ref 0.0–0.2)

## 2018-11-01 MED ORDER — EPOETIN ALFA-EPBX 10000 UNIT/ML IJ SOLN
10000.0000 [IU] | Freq: Once | INTRAMUSCULAR | Status: AC
  Administered 2018-11-01: 10000 [IU] via SUBCUTANEOUS
  Filled 2018-11-01: qty 1

## 2018-11-01 MED ORDER — EPOETIN ALFA-EPBX 10000 UNIT/ML IJ SOLN
10000.0000 [IU] | Freq: Once | INTRAMUSCULAR | Status: AC
  Administered 2018-11-01: 10000 [IU] via SUBCUTANEOUS

## 2018-11-01 MED ORDER — PROCHLORPERAZINE MALEATE 10 MG PO TABS
10.0000 mg | ORAL_TABLET | Freq: Once | ORAL | Status: AC
  Administered 2018-11-01: 10 mg via ORAL
  Filled 2018-11-01: qty 1

## 2018-11-01 MED ORDER — BORTEZOMIB CHEMO SQ INJECTION 3.5 MG (2.5MG/ML)
1.3000 mg/m2 | Freq: Once | INTRAMUSCULAR | Status: AC
  Administered 2018-11-01: 2.5 mg via SUBCUTANEOUS
  Filled 2018-11-01: qty 1

## 2018-11-01 MED ORDER — DEXAMETHASONE 4 MG PO TABS
10.0000 mg | ORAL_TABLET | Freq: Once | ORAL | Status: AC
  Administered 2018-11-01: 10 mg via ORAL
  Filled 2018-11-01: qty 3

## 2018-11-01 NOTE — Progress Notes (Signed)
Hgb 7.4 and Ser Creat 5.27 today.  Ok to treat today verbal order Dr. Delton Coombes with velcade and retacrit.     Patient seen by Dr. Delton Coombes with verbal order to increase Retacrit.  Patient to receive retacrit 20,000 units today verbal order Dr. Delton Coombes.  Dr. Nino Glow office notified we will be managing these injections.  Pharmacy notified.     Patient tolerated injection with no complaints voiced.  Site clean and dry with no bruising or swelling noted at site.  Band aid applied.  Vss with discharge and left ambulatory with no s/s of distress noted.

## 2018-11-01 NOTE — Progress Notes (Signed)
[place], [place] [address]   CLINIC:  Medical Oncology/Hematology  PCP:  Monico Blitz, [place] [address] [phone]   REASON FOR VISIT:  Follow-up for plasma cell myeloma and anemia.  CURRENT THERAPY: Velcade, dexamethasone, Procrit and Feraheme.  BRIEF ONCOLOGIC HISTORY:  Oncology History  Stage 1 infiltrating ductal carcinoma of right female breast ([place])  09/12/2014 Mammogram   Mass in upper outer R breast, middle third depth appears slightly larger than it was on prior exam with more irreg spiculated margins   10/02/2014 Pathology Results   biopsy with invasive ductal carcinoma high grade 1.1 cm, dcis solid type. additional R breast tissue, excision, invasive ductal high grade 0.9 cm    10/24/2014 Pathology Results   no residual invasive carcinoma, DCIS, focal, atypical ductal hyperplasia, 0/7 LN positive for metastatic carcinoma ER > 90%, PR 30%, HER 2 2+   10/24/2014 Cancer Staging   T1cN0M0   10/24/2014 Surgery   R mastectomy, T1c, N0   12/28/2014 - 11/22/2015 Chemotherapy   Taxol/herceptin weekly X 12, Herceptin every 21 days, last due in September 2017   03/11/2015 -  Anti-estrogen oral therapy   Arimidex 1 mg daily   09/12/2015 Imaging   MUGA- The left ventricular ejection fraction equals 68%.   06/08/2016 Treatment Plan Change   Started Nerlynx   Multiple myeloma ([place])  10/04/2018 Initial Diagnosis   Multiple myeloma ([place])   10/11/2018 -  Chemotherapy   The patient had bortezomib SQ (VELCADE) chemo injection 2.5 mg, 1.3 mg/m2 = 2.5 mg, Subcutaneous,  Once, 2 of 8 cycles Administration: 2.5 mg (10/11/2018), 2.5 mg (10/18/2018), 2.5 mg (10/25/2018), 2.5 mg (11/01/2018)  for chemotherapy treatment.       CANCER STAGING: Cancer Staging Stage 1 infiltrating ductal carcinoma of right female breast [facility]) Staging form: Breast, AJCC 7th Edition - Clinical stage from 08/30/2015: Stage IA (T1c, N0, M0) - Signed by  Baird Cancer, PA-C on 08/30/2015    INTERVAL HISTORY:  Carly Wood 83 y.o. female seen for follow-up of multiple myeloma and anemia.  She was started on weekly Velcade on 10/11/2018.  Last injection was on 10/25/2018.  She also received Feraheme on 10/25/2018.  She is accompanied by her other 2 sisters today.  Energy levels are 50%.  Appetite is 100%.  Had a minor nosebleed but denies any bleeding per rectum or melena.  She has some numbness in the right hand pinky finger.  No numbness in the feet was reported.  No fevers or chills reported.  REVIEW OF SYSTEMS:  Review of Systems  All other systems reviewed and are negative.    PAST MEDICAL/SURGICAL HISTORY:  Past Medical History:  Diagnosis Date  . Anemia   . Anxiety   . Chronic kidney disease   . Chronic renal disease, stage 4, severely decreased glomerular filtration rate (GFR) between 15-29 mL/min/1.73 square meter (HCC) 08/22/2015  . Complication of anesthesia    delirious after Breast Surgery  . Dementia ([place])    mild  . Depression   . Diabetes mellitus without complication ([place])    type II  . Dyspnea    with activity  . GERD (gastroesophageal reflux disease)   . Glaucoma   . Hypertension   . Pneumonia   . Stage 1 infiltrating ductal carcinoma of right female breast ([place]) 08/21/2015   ER+ PR+ HER 2 neu + (3+) T1cN0    Past Surgical History:  Procedure Laterality Date  .  AV FISTULA PLACEMENT Left 11/22/2017   Procedure: ARTERIOVENOUS (AV) FISTULA CREATION LEFT ARM;  Surgeon: Elam Dutch, MD;  Location: [place];  Service: Vascular;  Laterality: Left;  . BIOPSY  08/07/2016   Procedure: BIOPSY;  Surgeon: Daneil Dolin, MD;  [facility];  Service: Endoscopy;;  gastric ulcer biopsy  . COLONOSCOPY    . ESOPHAGOGASTRODUODENOSCOPY N/A 08/07/2016   Procedure: ESOPHAGOGASTRODUODENOSCOPY (EGD);  Surgeon: Daneil Dolin, MD;  [facility];  Service: Endoscopy;  Laterality: N/A;  1215-rescheduled to 6/1 @  2:30pm per Tretha Sciara  . ESOPHAGOGASTRODUODENOSCOPY N/A 11/27/2016   Procedure: ESOPHAGOGASTRODUODENOSCOPY (EGD);  Surgeon: Daneil Dolin, MD;  [facility];  Service: Endoscopy;  Laterality: N/A;  8:15am  . FISTULA SUPERFICIALIZATION Left 02/14/2018   Procedure: FISTULA SUPERFICIALIZATION LEFT ARM;  Surgeon: Angelia Mould, MD;  Location: [place];  Service: Vascular;  Laterality: Left;  . FRACTURE SURGERY Right    ankle  . MALONEY DILATION N/A 08/07/2016   Procedure: Venia Minks DILATION;  Surgeon: Daneil Dolin, MD;  [facility];  Service: Endoscopy;  Laterality: N/A;  . MASTECTOMY, PARTIAL Right   . PORT-A-CATH REMOVAL Left 11/22/2017   Procedure: REMOVAL PORT-A-CATH LEFT CHEST;  Surgeon: Elam Dutch, MD;  Location: [facility];  Service: Vascular;  Laterality: Left;  . RETINAL DETACHMENT SURGERY Right      SOCIAL HISTORY:  Social History   Socioeconomic History  . Marital status: Single    Spouse name: Not on file  . Number of children: Not on file  . Years of education: Not on file  . Highest education level: Not on file  Occupational History  . Not on file  Social Needs  . Financial resource strain: Not on file  . Food insecurity    Worry: Not on file    Inability: Not on file  . Transportation needs    Medical: Not on file    Non-medical: Not on file  Tobacco Use  . Smoking status: Never Smoker  . Smokeless tobacco: Never Used  Substance and Sexual Activity  . Alcohol use: No    Alcohol/week: 0.0 standard drinks  . Drug use: No  . Sexual activity: Not on file  Lifestyle  . Physical activity    Days per week: Not on file    Minutes per session: Not on file  . Stress: Not on file  Relationships  . Social Herbalist on phone: Not on file    Gets together: Not on file    Attends religious service: Not on file    Active member of club or organization: Not on file    Attends meetings of clubs or organizations: Not on file     Relationship status: Not on file  . Intimate partner violence    Fear of current or ex partner: Not on file    Emotionally abused: Not on file    Physically abused: Not on file    Forced sexual activity: Not on file  Other Topics Concern  . Not on file  Social History Narrative  . Not on file    FAMILY HISTORY:  Family History  Problem Relation Age of Onset  . Multiple myeloma Sister   . Brain cancer Sister   . Dementia Mother        died at 7  . Stroke Mother   . Heart failure Mother   . Diabetes Mother   . Heart disease Father   .  Prostate cancer Brother   . Colon cancer Neg Hx     CURRENT MEDICATIONS:  Outpatient Encounter Medications as of 11/01/2018  Medication Sig  . ACCU-CHEK AVIVA PLUS test strip   . acetaminophen (TYLENOL) 500 MG tablet Take 500 mg by mouth every 6 (six) hours as needed.   Marland Kitchen acyclovir (ZOVIRAX) 200 MG capsule Take 1 capsule (200 mg total) by mouth 2 (two) times daily.  . Alcohol Swabs (ALCOHOL PREP) 70 % PADS See admin instructions.  Marland Kitchen amLODipine (NORVASC) 10 MG tablet Take 10 mg by mouth daily.  Marland Kitchen anastrozole (ARIMIDEX) 1 MG tablet TAKE 1 TABLET BY MOUTH DAILY  . anti-nausea (EMETROL) solution Take 10 mLs by mouth every 15 (fifteen) minutes as needed for nausea.   Marland Kitchen AQUALANCE LANCETS 30G MISC USE TO test twice daily  . aspirin EC 81 MG tablet Take 81 mg by mouth daily.  . bortezomib IV (VELCADE) 3.5 MG injection Inject into the vein once a week. Weekly every 21 days  . calcitRIOL (ROCALTROL) 0.25 MCG capsule Take 0.25 mcg by mouth daily at 12 noon.   . calcium-vitamin D (OSCAL 500/200 D-3) 500-200 MG-UNIT tablet Take 1 tablet by mouth daily at 12 noon.   . cloNIDine (CATAPRES) 0.3 MG tablet Take 0.3 mg by mouth 2 (two) times daily.   Marland Kitchen epoetin alfa (EPOGEN,PROCRIT) 4000 UNIT/ML injection Inject 4,000 Units into the vein.  Elmore Guise Devices (ADJUSTABLE LANCING DEVICE) MISC TO check blood glucose  . Multiple Vitamin (MULTIVITAMIN) capsule Take 1  capsule by mouth daily.  . ONGLYZA 5 MG TABS tablet Take 5 mg by mouth daily.  Marland Kitchen PARoxetine (PAXIL) 20 MG tablet Take 20 mg by mouth daily.   . pioglitazone (ACTOS) 30 MG tablet Take 15 mg by mouth daily.   . pravastatin (PRAVACHOL) 20 MG tablet Take 20 mg by mouth every evening.   Marland Kitchen TOUJEO SOLOSTAR 300 UNIT/ML SOPN INJECT FIVE UNITS INTO THE SKIN DAILY (270 DAY SUPPLY)  . traZODone (DESYREL) 100 MG tablet Take 100 mg by mouth at bedtime.  . [DISCONTINUED] iron polysaccharides (IFEREX 150) 150 MG capsule TAKE ONE CAPSULE BY MOUTH EVERY DAY  . [DISCONTINUED] iron polysaccharides (NIFEREX) 150 MG capsule Take 150 mg by mouth daily at 12 noon.    No facility-administered encounter medications on file as of 11/01/2018.     ALLERGIES:  Allergies  Allergen Reactions  . Penicillins Other (See Comments)    Unsure of reaction Has patient had a PCN reaction causing immediate rash, facial/tongue/throat swelling, SOB or lightheadedness with hypotension: Unknown Has patient had a PCN reaction causing severe rash involving mucus membranes or skin necrosis: Unknown Has patient had a PCN reaction that required hospitalization: No Has patient had a PCN reaction occurring within the last 10 years: Unknown If all of the above answers are "NO", then may proceed with Cephalosporin use.    . Ace Inhibitors Cough and Other (See Comments)    Tongue swell  . Lisinopril Other (See Comments)    Tongue swell     PHYSICAL EXAM:  ECOG Performance status: 2  Vitals:   11/01/18 0900  BP: (!) 123/51  Pulse: 75  Resp: 16  Temp: (!) 97.1 F (36.2 C)  SpO2: 97%   Filed Weights   11/01/18 0900  Weight: 166 lb 8 oz (75.5 kg)    Physical Exam Constitutional:      Appearance: Normal appearance. She is obese.  HENT:     Head: Normocephalic.  Nose: Nose normal.     Mouth/Throat:     Mouth: Mucous membranes are moist.     Pharynx: Oropharynx is clear.  Eyes:     Extraocular Movements: Extraocular  movements intact.     Conjunctiva/sclera: Conjunctivae normal.  Neck:     Musculoskeletal: Normal range of motion.  Cardiovascular:     Rate and Rhythm: Normal rate and regular rhythm.     Pulses: Normal pulses.     Heart sounds: Normal heart sounds.  Pulmonary:     Effort: Pulmonary effort is normal.     Breath sounds: Normal breath sounds.  Abdominal:     General: Bowel sounds are normal.     Palpations: Abdomen is soft.  Musculoskeletal:     Right lower leg: Edema present.     Left lower leg: Edema present.  Skin:    General: Skin is warm and dry.  Neurological:     General: No focal deficit present.     Mental Status: She is alert and oriented to person, place, and time.  Psychiatric:        Mood and Affect: Mood normal.        Behavior: Behavior normal.        Thought Content: Thought content normal.        Judgment: Judgment normal.      LABORATORY DATA:  I have reviewed the labs as listed.  CBC    Component Value Date/Time   WBC 7.1 11/01/2018 0854   RBC 2.43 (L) 11/01/2018 0854   HGB 7.4 (L) 11/01/2018 0854   HCT 24.1 (L) 11/01/2018 0854   PLT 201 11/01/2018 0854   MCV 99.2 11/01/2018 0854   MCH 30.5 11/01/2018 0854   MCHC 30.7 11/01/2018 0854   RDW 17.4 (H) 11/01/2018 0854   LYMPHSABS 0.7 11/01/2018 0854   MONOABS 0.4 11/01/2018 0854   EOSABS 0.1 11/01/2018 0854   BASOSABS 0.0 11/01/2018 0854   CMP Latest Ref Rng & Units 11/01/2018 10/25/2018 10/18/2018  Glucose 70 - 99 mg/dL 196(H) 169(H) 146(H)  BUN 8 - 23 mg/dL 46(H) 40(H) 44(H)  Creatinine 0.44 - 1.00 mg/dL 5.27(H) 5.19(H) 5.22(H)  Sodium 135 - 145 mmol/L 140 139 142  Potassium 3.5 - 5.1 mmol/L 3.8 3.6 4.2  Chloride 98 - 111 mmol/L 108 106 108  CO2 22 - 32 mmol/L '23 22 24  ' Calcium 8.9 - 10.3 mg/dL 8.6(L) 8.5(L) 9.0  Total Protein 6.5 - 8.1 g/dL 6.3(L) 7.1 7.1  Total Bilirubin 0.3 - 1.2 mg/dL 0.3 0.5 0.4  Alkaline Phos 38 - 126 U/L 44 47 44  AST 15 - 41 U/L '19 18 20  ' ALT 0 - 44 U/L '10 10 10    ' I have independently reviewed her scans and discussed with her.     ASSESSMENT & PLAN:   Multiple myeloma ([place]) 1.  IgA lambda plasma cell myeloma, stage II, standard risk: -Labs on 08/29/2018 shows M spike of 0.9 g/dL.  Kappa light chains 148, lambda light chains 1132, ratio 0.13.  LDH normal..  2 microglobulin 13.5. - She was referred by Dr. Lowanda Foster for worsening renal function. -Bone marrow biopsy on 09/20/2018 shows plasma cell myeloma with 30% plasma cells, lambda restricted.  FISH panel was normal.  Chromosome analysis was normal. - She has transfusion dependent anemia, last transfusion on 09/13/2018. - Velcade and dexamethasone started on 10/11/2018.  Last injection on 10/25/2018.  Tolerating very well. - We reviewed lab work.  Hemoglobin is 7.4 and creatinine  is 5.27. -She will proceed with her treatment today.  We will plan to repeat myeloma labs next week.  I will see her back in 2 weeks.  If there is no adequate response, will consider adding cyclophosphamide.  2.  Normocytic anemia: -Combination anemia from CKD, plasma cell myeloma and iron deficiency. - She received Feraheme on 10/14/2018 and 10/25/2018. -She has been receiving Procrit 4000 units weekly.  Will increase it to 20,000 units weekly as her hemoglobin dropped down to 7.4.  3.  Stage I IDC of the right breast, ER/PR positive, HER-2 positive: -She will continue anastrozole.  4.  Osteopenia: -DEXA scan on 05/16/2015 shows osteopenia.  She will continue calcium and vitamin D. -We will plan to repeat bone density in March 2021.  Total time spent is 25 minutes with more than 50% of the time spent face-to-face discussing treatment plan, counseling and coordination of care.    Orders placed this encounter:  Orders Placed This Encounter  Procedures  . CBC with Differential/Platelet  . Comprehensive metabolic panel  . Protein electrophoresis, serum  . Kappa/lambda light chains  . Lactate dehydrogenase      Derek Jack, MD [place] [phone]

## 2018-11-01 NOTE — Assessment & Plan Note (Signed)
1.  IgA lambda plasma cell myeloma, stage II, standard risk: -Labs on 08/29/2018 shows M spike of 0.9 g/dL.  Kappa light chains 148, lambda light chains 1132, ratio 0.13.  LDH normal..  2 microglobulin 13.5. - She was referred by Dr. Lowanda Foster for worsening renal function. -Bone marrow biopsy on 09/20/2018 shows plasma cell myeloma with 30% plasma cells, lambda restricted.  FISH panel was normal.  Chromosome analysis was normal. - She has transfusion dependent anemia, last transfusion on 09/13/2018. - Velcade and dexamethasone started on 10/11/2018.  Last injection on 10/25/2018.  Tolerating very well. - We reviewed lab work.  Hemoglobin is 7.4 and creatinine is 5.27. -She will proceed with her treatment today.  We will plan to repeat myeloma labs next week.  I will see her back in 2 weeks.  If there is no adequate response, will consider adding cyclophosphamide.  2.  Normocytic anemia: -Combination anemia from CKD, plasma cell myeloma and iron deficiency. - She received Feraheme on 10/14/2018 and 10/25/2018. -She has been receiving Procrit 4000 units weekly.  Will increase it to 20,000 units weekly as her hemoglobin dropped down to 7.4.  3.  Stage I IDC of the right breast, ER/PR positive, HER-2 positive: -She will continue anastrozole.  4.  Osteopenia: -DEXA scan on 05/16/2015 shows osteopenia.  She will continue calcium and vitamin D. -We will plan to repeat bone density in March 2021.

## 2018-11-01 NOTE — Patient Instructions (Addendum)
[facility] at [facility] Discharge Instructions  You were seen today by Dr. Katragadda. He went over your recent lab results. He will see you back in 2 weeks for labs and follow up.   Thank you for choosing [facility] at [facility] to provide your oncology and hematology care.  To afford each patient quality time with our provider, please arrive at least 15 minutes before your scheduled appointment time.   If you have a lab appointment with the Cancer Center please come in thru the  Main Entrance and check in at the main information desk  You need to re-schedule your appointment should you arrive 10 or more minutes late.  We strive to give you quality time with our providers, and arriving late affects you and other patients whose appointments are after yours.  Also, if you no show three or more times for appointments you may be dismissed from the clinic at the providers discretion.     Again, thank you for choosing [facility].  Our hope is that these requests will decrease the amount of time that you wait before being seen by our physicians.       _____________________________________________________________  Should you have questions after your visit to [facility], please contact our office at [phone] between the hours of 8:00 a.m. and 4:30 p.m.  Voicemails left after 4:00 p.m. will not be returned until the following business day.  For prescription refill requests, have your pharmacy contact our office and allow 72 hours.    Cancer Center Support Programs:   > Cancer Support Group  2nd Tuesday of the month 1pm-2pm, Journey Room    

## 2018-11-01 NOTE — Progress Notes (Signed)
Per Dr. Delton Coombes, I spoke with Dr. Florentina Addison office (Nephrology) and advised that we are going to begin giving Retacrit here at the cancer center.  They sent a message to Dr. Lowanda Foster and will take it off their treatment plan at their clinic.

## 2018-11-03 MED ORDER — OCTREOTIDE ACETATE 20 MG IM KIT
PACK | INTRAMUSCULAR | Status: AC
  Filled 2018-11-03: qty 1

## 2018-11-08 ENCOUNTER — Encounter (HOSPITAL_COMMUNITY)
Admission: RE | Admit: 2018-11-08 | Discharge: 2018-11-08 | Disposition: A | Discharge status: Home / Self Care | Payer: Medicare Other | Source: Ambulatory Visit | Attending: Nephrology | Admitting: Nephrology

## 2018-11-08 ENCOUNTER — Encounter (HOSPITAL_COMMUNITY): Payer: Medicare Other

## 2018-11-08 ENCOUNTER — Encounter (HOSPITAL_COMMUNITY): Payer: Self-pay

## 2018-11-08 ENCOUNTER — Other Ambulatory Visit (HOSPITAL_COMMUNITY): Payer: Medicare Other

## 2018-11-08 ENCOUNTER — Ambulatory Visit (HOSPITAL_COMMUNITY): Payer: Medicare Other

## 2018-11-08 ENCOUNTER — Inpatient Hospital Stay (HOSPITAL_COMMUNITY): Payer: Medicare Other

## 2018-11-08 ENCOUNTER — Inpatient Hospital Stay (HOSPITAL_COMMUNITY): Payer: Medicare Other | Attending: Hematology

## 2018-11-08 ENCOUNTER — Other Ambulatory Visit: Payer: Self-pay

## 2018-11-08 ENCOUNTER — Ambulatory Visit (HOSPITAL_COMMUNITY): Payer: Medicare Other | Admitting: Hematology

## 2018-11-08 VITALS — BP 137/62 | HR 66 | Temp 97.1°F | Resp 18

## 2018-11-08 DIAGNOSIS — G4700 Insomnia, unspecified: Secondary | ICD-10-CM | POA: Insufficient documentation

## 2018-11-08 DIAGNOSIS — N184 Chronic kidney disease, stage 4 (severe): Secondary | ICD-10-CM

## 2018-11-08 DIAGNOSIS — K219 Gastro-esophageal reflux disease without esophagitis: Secondary | ICD-10-CM | POA: Diagnosis not present

## 2018-11-08 DIAGNOSIS — F0390 Unspecified dementia without behavioral disturbance: Secondary | ICD-10-CM | POA: Diagnosis not present

## 2018-11-08 DIAGNOSIS — Z79811 Long term (current) use of aromatase inhibitors: Secondary | ICD-10-CM | POA: Insufficient documentation

## 2018-11-08 DIAGNOSIS — D631 Anemia in chronic kidney disease: Secondary | ICD-10-CM | POA: Diagnosis not present

## 2018-11-08 DIAGNOSIS — C9000 Multiple myeloma not having achieved remission: Secondary | ICD-10-CM

## 2018-11-08 DIAGNOSIS — M8580 Other specified disorders of bone density and structure, unspecified site: Secondary | ICD-10-CM | POA: Insufficient documentation

## 2018-11-08 DIAGNOSIS — Z7982 Long term (current) use of aspirin: Secondary | ICD-10-CM | POA: Diagnosis not present

## 2018-11-08 DIAGNOSIS — Z7984 Long term (current) use of oral hypoglycemic drugs: Secondary | ICD-10-CM | POA: Diagnosis not present

## 2018-11-08 DIAGNOSIS — Z79899 Other long term (current) drug therapy: Secondary | ICD-10-CM | POA: Insufficient documentation

## 2018-11-08 DIAGNOSIS — C50919 Malignant neoplasm of unspecified site of unspecified female breast: Secondary | ICD-10-CM | POA: Diagnosis not present

## 2018-11-08 DIAGNOSIS — E1122 Type 2 diabetes mellitus with diabetic chronic kidney disease: Secondary | ICD-10-CM | POA: Diagnosis not present

## 2018-11-08 DIAGNOSIS — I129 Hypertensive chronic kidney disease with stage 1 through stage 4 chronic kidney disease, or unspecified chronic kidney disease: Secondary | ICD-10-CM | POA: Diagnosis not present

## 2018-11-08 DIAGNOSIS — F329 Major depressive disorder, single episode, unspecified: Secondary | ICD-10-CM | POA: Insufficient documentation

## 2018-11-08 DIAGNOSIS — Z170 Estrogen receptor positive status [ER+]: Secondary | ICD-10-CM | POA: Diagnosis not present

## 2018-11-08 DIAGNOSIS — Z23 Encounter for immunization: Secondary | ICD-10-CM | POA: Insufficient documentation

## 2018-11-08 DIAGNOSIS — D509 Iron deficiency anemia, unspecified: Secondary | ICD-10-CM

## 2018-11-08 DIAGNOSIS — R600 Localized edema: Secondary | ICD-10-CM | POA: Diagnosis not present

## 2018-11-08 DIAGNOSIS — Z9011 Acquired absence of right breast and nipple: Secondary | ICD-10-CM | POA: Diagnosis not present

## 2018-11-08 DIAGNOSIS — Z9221 Personal history of antineoplastic chemotherapy: Secondary | ICD-10-CM | POA: Diagnosis not present

## 2018-11-08 DIAGNOSIS — Z5111 Encounter for antineoplastic chemotherapy: Secondary | ICD-10-CM | POA: Diagnosis present

## 2018-11-08 LAB — CBC WITH DIFFERENTIAL/PLATELET
Abs Immature Granulocytes: 0.03 10*3/uL (ref 0.00–0.07)
Basophils Absolute: 0 10*3/uL (ref 0.0–0.1)
Basophils Relative: 0 %
Eosinophils Absolute: 0.1 10*3/uL (ref 0.0–0.5)
Eosinophils Relative: 1 %
HCT: 27.4 % — ABNORMAL LOW (ref 36.0–46.0)
Hemoglobin: 8.3 g/dL — ABNORMAL LOW (ref 12.0–15.0)
Immature Granulocytes: 0 %
Lymphocytes Relative: 6 %
Lymphs Abs: 0.4 10*3/uL — ABNORMAL LOW (ref 0.7–4.0)
MCH: 31 pg (ref 26.0–34.0)
MCHC: 30.3 g/dL (ref 30.0–36.0)
MCV: 102.2 fL — ABNORMAL HIGH (ref 80.0–100.0)
Monocytes Absolute: 0.4 10*3/uL (ref 0.1–1.0)
Monocytes Relative: 5 %
Neutro Abs: 6.1 10*3/uL (ref 1.7–7.7)
Neutrophils Relative %: 88 %
Platelets: 210 10*3/uL (ref 150–400)
RBC: 2.68 MIL/uL — ABNORMAL LOW (ref 3.87–5.11)
RDW: 20.7 % — ABNORMAL HIGH (ref 11.5–15.5)
WBC: 7 10*3/uL (ref 4.0–10.5)
nRBC: 0 % (ref 0.0–0.2)

## 2018-11-08 LAB — LACTATE DEHYDROGENASE: LDH: 167 U/L (ref 98–192)

## 2018-11-08 LAB — COMPREHENSIVE METABOLIC PANEL
ALT: 11 U/L (ref 0–44)
AST: 18 U/L (ref 15–41)
Albumin: 3.3 g/dL — ABNORMAL LOW (ref 3.5–5.0)
Alkaline Phosphatase: 48 U/L (ref 38–126)
Anion gap: 9 (ref 5–15)
BUN: 37 mg/dL — ABNORMAL HIGH (ref 8–23)
CO2: 25 mmol/L (ref 22–32)
Calcium: 8.5 mg/dL — ABNORMAL LOW (ref 8.9–10.3)
Chloride: 107 mmol/L (ref 98–111)
Creatinine, Ser: 5.29 mg/dL — ABNORMAL HIGH (ref 0.44–1.00)
GFR calc Af Amer: 8 mL/min — ABNORMAL LOW (ref >60)
GFR calc non Af Amer: 7 mL/min — ABNORMAL LOW (ref >60)
Glucose, Bld: 188 mg/dL — ABNORMAL HIGH (ref 70–99)
Potassium: 3.8 mmol/L (ref 3.5–5.1)
Sodium: 141 mmol/L (ref 135–145)
Total Bilirubin: 0.1 mg/dL — ABNORMAL LOW (ref 0.3–1.2)
Total Protein: 6.6 g/dL (ref 6.5–8.1)

## 2018-11-08 MED ORDER — EPOETIN ALFA-EPBX 10000 UNIT/ML IJ SOLN
20000.0000 [IU] | Freq: Once | INTRAMUSCULAR | Status: AC
  Administered 2018-11-08: 20000 [IU] via SUBCUTANEOUS
  Filled 2018-11-08: qty 2

## 2018-11-08 MED ORDER — DEXAMETHASONE 4 MG PO TABS
10.0000 mg | ORAL_TABLET | Freq: Once | ORAL | Status: AC
  Administered 2018-11-08: 10 mg via ORAL
  Filled 2018-11-08: qty 3

## 2018-11-08 MED ORDER — DIPHENHYDRAMINE HCL 25 MG PO CAPS
ORAL_CAPSULE | ORAL | Status: AC
  Filled 2018-11-08: qty 1

## 2018-11-08 MED ORDER — ACETAMINOPHEN 325 MG PO TABS
ORAL_TABLET | ORAL | Status: AC
  Filled 2018-11-08: qty 2

## 2018-11-08 MED ORDER — BORTEZOMIB CHEMO SQ INJECTION 3.5 MG (2.5MG/ML)
1.3000 mg/m2 | Freq: Once | INTRAMUSCULAR | Status: AC
  Administered 2018-11-08: 2.5 mg via SUBCUTANEOUS
  Filled 2018-11-08: qty 1

## 2018-11-08 MED ORDER — EPOETIN ALFA-EPBX 10000 UNIT/ML IJ SOLN
INTRAMUSCULAR | Status: AC
  Filled 2018-11-08: qty 1

## 2018-11-08 MED ORDER — PROCHLORPERAZINE MALEATE 10 MG PO TABS
10.0000 mg | ORAL_TABLET | Freq: Once | ORAL | Status: AC
  Administered 2018-11-08: 11:00:00 10 mg via ORAL
  Filled 2018-11-08: qty 1

## 2018-11-08 NOTE — Progress Notes (Signed)
11/08/2018 [facility] called. Ms. Branan will not be coming for her appt procrit. Stated that pt received retacrit injection while in the [place].

## 2018-11-08 NOTE — Progress Notes (Signed)
Carly Wood tolerated Velcade and Retacrit injections well without complaints or incident. VSS Labs reviewed prior to administering medications Hgb 8.3 today. Pt discharged via wheelchair in satisfactory condition

## 2018-11-08 NOTE — Progress Notes (Signed)
11/08/18 0946-Talked with [facility]. Pt is there for her injection. Informed them to send pt to Short Stay after her injection of procrit. Voiced understanding.

## 2018-11-08 NOTE — Patient Instructions (Signed)
[facility] Discharge Instructions for Patients Receiving Chemotherapy   Beginning January 23rd 2017 lab work for the [facility] will be done in the  Main lab at [facility] on 1st floor. If you have a lab appointment with the [facility] please come in thru the  Main Entrance and check in at the main information desk   Today you received the following chemotherapy agents Velcade injection as well as Retacrit injection. Follow-up as scheduled. Call clinic for any questions or concerns  To help prevent nausea and vomiting after your treatment, we encourage you to take your nausea medication   If you develop nausea and vomiting, or diarrhea that is not controlled by your medication, call the clinic.  The clinic phone number is [phone]. Office hours are Monday-Friday 8:30am-5:00pm.  BELOW ARE SYMPTOMS THAT SHOULD BE REPORTED IMMEDIATELY:  *FEVER GREATER THAN 101.0 F  *CHILLS WITH OR WITHOUT FEVER  NAUSEA AND VOMITING THAT IS NOT CONTROLLED WITH YOUR NAUSEA MEDICATION  *UNUSUAL SHORTNESS OF BREATH  *UNUSUAL BRUISING OR BLEEDING  TENDERNESS IN MOUTH AND THROAT WITH OR WITHOUT PRESENCE OF ULCERS  *URINARY PROBLEMS  *BOWEL PROBLEMS  UNUSUAL RASH Items with * indicate a potential emergency and should be followed up as soon as possible. If you have an emergency after office hours please contact your primary care physician or go to the nearest emergency department.  Please call the clinic during office hours if you have any questions or concerns.   You may also contact the Patient Navigator at [phone] should you have any questions or need assistance in obtaining follow up care.      Resources For Cancer Patients and their Caregivers ? American Cancer Society: Can assist with transportation, wigs, general needs, runs Look Good Feel Better.        (845) 484-0014 ? Cancer Care: Provides financial assistance, online support groups,  medication/co-pay assistance.  1-800-813-HOPE 480-691-4391) ? [place] Assists [place] cancer patients and their families through emotional , educational and financial support.  216-531-7194 ? [place] DSS Where to apply for food stamps, Medicaid and utility assistance. (615)579-8011 ? [facility]: Transportation to medical appointments. 807-401-8640 ? Social Security Administration: May apply for disability if have a Stage IV cancer. 7044619856 639 750 4096 ? [employer], Disability and Transit Services: Assists with nutrition, care and transit needs. (985) 210-6065

## 2018-11-09 LAB — PROTEIN ELECTROPHORESIS, SERUM
A/G Ratio: 1.1 (ref 0.7–1.7)
Albumin ELP: 3.1 g/dL (ref 2.9–4.4)
Alpha-1-Globulin: 0.2 g/dL (ref 0.0–0.4)
Alpha-2-Globulin: 0.8 g/dL (ref 0.4–1.0)
Beta Globulin: 0.9 g/dL (ref 0.7–1.3)
Gamma Globulin: 0.7 g/dL (ref 0.4–1.8)
Globulin, Total: 2.7 g/dL (ref 2.2–3.9)
Total Protein ELP: 5.8 g/dL — ABNORMAL LOW (ref 6.0–8.5)

## 2018-11-09 LAB — KAPPA/LAMBDA LIGHT CHAINS
Kappa free light chain: 104.6 mg/L — ABNORMAL HIGH (ref 3.3–19.4)
Kappa, lambda light chain ratio: 0.39 (ref 0.26–1.65)
Lambda free light chains: 270.3 mg/L — ABNORMAL HIGH (ref 5.7–26.3)

## 2018-11-10 MED ORDER — OCTREOTIDE ACETATE 20 MG IM KIT
PACK | INTRAMUSCULAR | Status: AC
  Filled 2018-11-10: qty 1

## 2018-11-10 MED ORDER — LANREOTIDE ACETATE 120 MG/0.5ML ~~LOC~~ SOLN
SUBCUTANEOUS | Status: AC
  Filled 2018-11-10: qty 120

## 2018-11-10 MED ORDER — LIDOCAINE HCL (PF) 1 % IJ SOLN
INTRAMUSCULAR | Status: AC
  Filled 2018-11-10: qty 10

## 2018-11-15 ENCOUNTER — Inpatient Hospital Stay (HOSPITAL_BASED_OUTPATIENT_CLINIC_OR_DEPARTMENT_OTHER): Payer: Medicare Other | Admitting: Hematology

## 2018-11-15 ENCOUNTER — Inpatient Hospital Stay (HOSPITAL_COMMUNITY): Payer: Medicare Other

## 2018-11-15 ENCOUNTER — Other Ambulatory Visit: Payer: Self-pay

## 2018-11-15 ENCOUNTER — Encounter (HOSPITAL_COMMUNITY): Payer: Self-pay | Admitting: Hematology

## 2018-11-15 DIAGNOSIS — C9000 Multiple myeloma not having achieved remission: Secondary | ICD-10-CM

## 2018-11-15 DIAGNOSIS — N184 Chronic kidney disease, stage 4 (severe): Secondary | ICD-10-CM

## 2018-11-15 DIAGNOSIS — D509 Iron deficiency anemia, unspecified: Secondary | ICD-10-CM

## 2018-11-15 DIAGNOSIS — Z5111 Encounter for antineoplastic chemotherapy: Secondary | ICD-10-CM | POA: Diagnosis not present

## 2018-11-15 LAB — COMPREHENSIVE METABOLIC PANEL
ALT: 10 U/L (ref 0–44)
AST: 19 U/L (ref 15–41)
Albumin: 3.5 g/dL (ref 3.5–5.0)
Alkaline Phosphatase: 50 U/L (ref 38–126)
Anion gap: 11 (ref 5–15)
BUN: 36 mg/dL — ABNORMAL HIGH (ref 8–23)
CO2: 24 mmol/L (ref 22–32)
Calcium: 9 mg/dL (ref 8.9–10.3)
Chloride: 107 mmol/L (ref 98–111)
Creatinine, Ser: 4.99 mg/dL — ABNORMAL HIGH (ref 0.44–1.00)
GFR calc Af Amer: 9 mL/min — ABNORMAL LOW (ref >60)
GFR calc non Af Amer: 8 mL/min — ABNORMAL LOW (ref >60)
Glucose, Bld: 152 mg/dL — ABNORMAL HIGH (ref 70–99)
Potassium: 3.9 mmol/L (ref 3.5–5.1)
Sodium: 142 mmol/L (ref 135–145)
Total Bilirubin: 0.2 mg/dL — ABNORMAL LOW (ref 0.3–1.2)
Total Protein: 6.9 g/dL (ref 6.5–8.1)

## 2018-11-15 LAB — CBC WITH DIFFERENTIAL/PLATELET
Abs Immature Granulocytes: 0.03 10*3/uL (ref 0.00–0.07)
Basophils Absolute: 0 10*3/uL (ref 0.0–0.1)
Basophils Relative: 0 %
Eosinophils Absolute: 0.1 10*3/uL (ref 0.0–0.5)
Eosinophils Relative: 2 %
HCT: 31.1 % — ABNORMAL LOW (ref 36.0–46.0)
Hemoglobin: 9.3 g/dL — ABNORMAL LOW (ref 12.0–15.0)
Immature Granulocytes: 0 %
Lymphocytes Relative: 7 %
Lymphs Abs: 0.5 10*3/uL — ABNORMAL LOW (ref 0.7–4.0)
MCH: 31.1 pg (ref 26.0–34.0)
MCHC: 29.9 g/dL — ABNORMAL LOW (ref 30.0–36.0)
MCV: 104 fL — ABNORMAL HIGH (ref 80.0–100.0)
Monocytes Absolute: 0.5 10*3/uL (ref 0.1–1.0)
Monocytes Relative: 7 %
Neutro Abs: 6.1 10*3/uL (ref 1.7–7.7)
Neutrophils Relative %: 84 %
Platelets: 201 10*3/uL (ref 150–400)
RBC: 2.99 MIL/uL — ABNORMAL LOW (ref 3.87–5.11)
RDW: 21.2 % — ABNORMAL HIGH (ref 11.5–15.5)
WBC: 7.3 10*3/uL (ref 4.0–10.5)
nRBC: 0 % (ref 0.0–0.2)

## 2018-11-15 MED ORDER — BORTEZOMIB CHEMO SQ INJECTION 3.5 MG (2.5MG/ML)
1.3000 mg/m2 | Freq: Once | INTRAMUSCULAR | Status: AC
  Administered 2018-11-15: 12:00:00 2.5 mg via SUBCUTANEOUS
  Filled 2018-11-15: qty 1

## 2018-11-15 MED ORDER — DEXAMETHASONE 4 MG PO TABS
10.0000 mg | ORAL_TABLET | Freq: Once | ORAL | Status: AC
  Administered 2018-11-15: 12:00:00 10 mg via ORAL
  Filled 2018-11-15: qty 3

## 2018-11-15 MED ORDER — EPOETIN ALFA-EPBX 10000 UNIT/ML IJ SOLN
20000.0000 [IU] | Freq: Once | INTRAMUSCULAR | Status: AC
  Administered 2018-11-15: 20000 [IU] via SUBCUTANEOUS
  Filled 2018-11-15: qty 2

## 2018-11-15 MED ORDER — PROCHLORPERAZINE MALEATE 10 MG PO TABS
10.0000 mg | ORAL_TABLET | Freq: Once | ORAL | Status: AC
  Administered 2018-11-15: 10 mg via ORAL
  Filled 2018-11-15: qty 1

## 2018-11-15 NOTE — Patient Instructions (Addendum)
[facility] at [facility] Discharge Instructions  You were seen today by Dr. Katragadda. He went over your recent lab results. He will see you back in 2 weeks for labs and follow up.   Thank you for choosing [facility] at [facility] to provide your oncology and hematology care.  To afford each patient quality time with our provider, please arrive at least 15 minutes before your scheduled appointment time.   If you have a lab appointment with the Cancer Center please come in thru the  Main Entrance and check in at the main information desk  You need to re-schedule your appointment should you arrive 10 or more minutes late.  We strive to give you quality time with our providers, and arriving late affects you and other patients whose appointments are after yours.  Also, if you no show three or more times for appointments you may be dismissed from the clinic at the providers discretion.     Again, thank you for choosing [facility].  Our hope is that these requests will decrease the amount of time that you wait before being seen by our physicians.       _____________________________________________________________  Should you have questions after your visit to  Cancer Center, please contact our office at [phone] between the hours of 8:00 a.m. and 4:30 p.m.  Voicemails left after 4:00 p.m. will not be returned until the following business day.  For prescription refill requests, have your pharmacy contact our office and allow 72 hours.    Cancer Center Support Programs:   > Cancer Support Group  2nd Tuesday of the month 1pm-2pm, Journey Room    

## 2018-11-15 NOTE — Progress Notes (Signed)
Labs reviewed with MD today at office visit. Proceed as planned today .   Retacrit given per orders.see MAR   Treatment given per orders. Patient tolerated it well without problems. Vitals stable and discharged home from clinic via wheelchair. . Follow up as scheduled.

## 2018-11-15 NOTE — Progress Notes (Signed)
[place], [place] [address]   CLINIC:  Medical Oncology/Hematology  PCP:  Monico Blitz, [place] [address] [phone]   REASON FOR VISIT:  Follow-up for plasma cell myeloma and anemia.  CURRENT THERAPY: Velcade, dexamethasone, Procrit and Feraheme.  BRIEF ONCOLOGIC HISTORY:  Oncology History  Stage 1 infiltrating ductal carcinoma of right female breast ([place])  09/12/2014 Mammogram   Mass in upper outer R breast, middle third depth appears slightly larger than it was on prior exam with more irreg spiculated margins   10/02/2014 Pathology Results   biopsy with invasive ductal carcinoma high grade 1.1 cm, dcis solid type. additional R breast tissue, excision, invasive ductal high grade 0.9 cm    10/24/2014 Pathology Results   no residual invasive carcinoma, DCIS, focal, atypical ductal hyperplasia, 0/7 LN positive for metastatic carcinoma ER > 90%, PR 30%, HER 2 2+   10/24/2014 Cancer Staging   T1cN0M0   10/24/2014 Surgery   R mastectomy, T1c, N0   12/28/2014 - 11/22/2015 Chemotherapy   Taxol/herceptin weekly X 12, Herceptin every 21 days, last due in September 2017   03/11/2015 -  Anti-estrogen oral therapy   Arimidex 1 mg daily   09/12/2015 Imaging   MUGA- The left ventricular ejection fraction equals 68%.   06/08/2016 Treatment Plan Change   Started Nerlynx   Multiple myeloma ([place])  10/04/2018 Initial Diagnosis   Multiple myeloma ([place])   10/11/2018 -  Chemotherapy   The patient had bortezomib SQ (VELCADE) chemo injection 2.5 mg, 1.3 mg/m2 = 2.5 mg, Subcutaneous,  Once, 2 of 8 cycles Administration: 2.5 mg (10/11/2018), 2.5 mg (10/18/2018), 2.5 mg (10/25/2018), 2.5 mg (11/01/2018), 2.5 mg (11/08/2018)  for chemotherapy treatment.       CANCER STAGING: Cancer Staging Stage 1 infiltrating ductal carcinoma of right female breast [facility]) Staging form: Breast, AJCC 7th Edition - Clinical stage from 08/30/2015: Stage IA (T1c, N0,  M0) - Signed by Baird Cancer, PA-C on 08/30/2015    INTERVAL HISTORY:  Carly Wood 83 y.o. female seen for follow-up of multiple myeloma.  She denies any bleeding per rectum or melena.  She is accompanied by her sister today.  Appetite and energy levels are 75%.  No new pains reported.  Leg swellings have been stable.  She reports that she has been taking trazodone 100 mg at bedtime for sleep.  However some days she does not easily fall asleep.  Denies any tingling or numbness in the extremities.  No nausea, vomiting, diarrhea or constipation reported from Velcade.  She is also tolerating dexamethasone very well.  REVIEW OF SYSTEMS:  Review of Systems  Cardiovascular: Positive for leg swelling.  Psychiatric/Behavioral: Positive for sleep disturbance.  All other systems reviewed and are negative.    PAST MEDICAL/SURGICAL HISTORY:  Past Medical History:  Diagnosis Date  . Anemia   . Anxiety   . Chronic kidney disease   . Chronic renal disease, stage 4, severely decreased glomerular filtration rate (GFR) between 15-29 mL/min/1.73 square meter (HCC) 08/22/2015  . Complication of anesthesia    delirious after Breast Surgery  . Dementia ([place])    mild  . Depression   . Diabetes mellitus without complication ([place])    type II  . Dyspnea    with activity  . GERD (gastroesophageal reflux disease)   . Glaucoma   . Hypertension   . Pneumonia   . Stage 1 infiltrating ductal carcinoma of right female breast ([place])  08/21/2015   ER+ PR+ HER 2 neu + (3+) T1cN0    Past Surgical History:  Procedure Laterality Date  . AV FISTULA PLACEMENT Left 11/22/2017   Procedure: ARTERIOVENOUS (AV) FISTULA CREATION LEFT ARM;  Surgeon: Elam Dutch, MD;  Location: [place];  Service: Vascular;  Laterality: Left;  . BIOPSY  08/07/2016   Procedure: BIOPSY;  Surgeon: Daneil Dolin, MD;  [facility];  Service: Endoscopy;;  gastric ulcer biopsy  . COLONOSCOPY    . ESOPHAGOGASTRODUODENOSCOPY N/A  08/07/2016   Procedure: ESOPHAGOGASTRODUODENOSCOPY (EGD);  Surgeon: Daneil Dolin, MD;  [facility];  Service: Endoscopy;  Laterality: N/A;  1215-rescheduled to 6/1 @ 2:30pm per Tretha Sciara  . ESOPHAGOGASTRODUODENOSCOPY N/A 11/27/2016   Procedure: ESOPHAGOGASTRODUODENOSCOPY (EGD);  Surgeon: Daneil Dolin, MD;  [facility];  Service: Endoscopy;  Laterality: N/A;  8:15am  . FISTULA SUPERFICIALIZATION Left 02/14/2018   Procedure: FISTULA SUPERFICIALIZATION LEFT ARM;  Surgeon: Angelia Mould, MD;  Location: [place];  Service: Vascular;  Laterality: Left;  . FRACTURE SURGERY Right    ankle  . MALONEY DILATION N/A 08/07/2016   Procedure: Venia Minks DILATION;  Surgeon: Daneil Dolin, MD;  [facility];  Service: Endoscopy;  Laterality: N/A;  . MASTECTOMY, PARTIAL Right   . PORT-A-CATH REMOVAL Left 11/22/2017   Procedure: REMOVAL PORT-A-CATH LEFT CHEST;  Surgeon: Elam Dutch, MD;  [facility];  Service: Vascular;  Laterality: Left;  . RETINAL DETACHMENT SURGERY Right      SOCIAL HISTORY:  Social History   Socioeconomic History  . Marital status: Single    Spouse name: Not on file  . Number of children: Not on file  . Years of education: Not on file  . Highest education level: Not on file  Occupational History  . Not on file  Social Needs  . Financial resource strain: Not on file  . Food insecurity    Worry: Not on file    Inability: Not on file  . Transportation needs    Medical: Not on file    Non-medical: Not on file  Tobacco Use  . Smoking status: Never Smoker  . Smokeless tobacco: Never Used  Substance and Sexual Activity  . Alcohol use: No    Alcohol/week: 0.0 standard drinks  . Drug use: No  . Sexual activity: Not on file  Lifestyle  . Physical activity    Days per week: Not on file    Minutes per session: Not on file  . Stress: Not on file  Relationships  . Social Herbalist on phone: Not on file    Gets together:  Not on file    Attends religious service: Not on file    Active member of club or organization: Not on file    Attends meetings of clubs or organizations: Not on file    Relationship status: Not on file  . Intimate partner violence    Fear of current or ex partner: Not on file    Emotionally abused: Not on file    Physically abused: Not on file    Forced sexual activity: Not on file  Other Topics Concern  . Not on file  Social History Narrative  . Not on file    FAMILY HISTORY:  Family History  Problem Relation Age of Onset  . Multiple myeloma Sister   . Brain cancer Sister   . Dementia Mother        died  at 73  . Stroke Mother   . Heart failure Mother   . Diabetes Mother   . Heart disease Father   . Prostate cancer Brother   . Colon cancer Neg Hx     CURRENT MEDICATIONS:  Outpatient Encounter Medications as of 11/15/2018  Medication Sig  . ACCU-CHEK AVIVA PLUS test strip   . acyclovir (ZOVIRAX) 200 MG capsule Take 1 capsule (200 mg total) by mouth 2 (two) times daily.  . Alcohol Swabs (ALCOHOL PREP) 70 % PADS See admin instructions.  Marland Kitchen amLODipine (NORVASC) 10 MG tablet Take 10 mg by mouth daily.  Marland Kitchen anastrozole (ARIMIDEX) 1 MG tablet TAKE 1 TABLET BY MOUTH DAILY  . anti-nausea (EMETROL) solution Take 10 mLs by mouth every 15 (fifteen) minutes as needed for nausea.   Marland Kitchen AQUALANCE LANCETS 30G MISC USE TO test twice daily  . aspirin EC 81 MG tablet Take 81 mg by mouth daily.  . bortezomib IV (VELCADE) 3.5 MG injection Inject into the vein once a week. Weekly every 21 days  . calcitRIOL (ROCALTROL) 0.25 MCG capsule Take 0.25 mcg by mouth daily at 12 noon.   . calcium-vitamin D (OSCAL 500/200 D-3) 500-200 MG-UNIT tablet Take 1 tablet by mouth daily at 12 noon.   . cloNIDine (CATAPRES) 0.3 MG tablet Take 0.3 mg by mouth 2 (two) times daily.   Marland Kitchen epoetin alfa (EPOGEN,PROCRIT) 4000 UNIT/ML injection Inject 4,000 Units into the vein.  Elmore Guise Devices (ADJUSTABLE LANCING DEVICE)  MISC TO check blood glucose  . Multiple Vitamin (MULTIVITAMIN) capsule Take 1 capsule by mouth daily.  . ONGLYZA 5 MG TABS tablet Take 5 mg by mouth daily.  Marland Kitchen PARoxetine (PAXIL) 20 MG tablet Take 20 mg by mouth daily.   . pioglitazone (ACTOS) 30 MG tablet Take 15 mg by mouth daily.   . pravastatin (PRAVACHOL) 20 MG tablet Take 20 mg by mouth every evening.   Marland Kitchen TOUJEO SOLOSTAR 300 UNIT/ML SOPN INJECT FIVE UNITS INTO THE SKIN DAILY (270 DAY SUPPLY)  . traZODone (DESYREL) 100 MG tablet Take 100 mg by mouth at bedtime.  Marland Kitchen acetaminophen (TYLENOL) 500 MG tablet Take 500 mg by mouth every 6 (six) hours as needed.    No facility-administered encounter medications on file as of 11/15/2018.     ALLERGIES:  Allergies  Allergen Reactions  . Penicillins Other (See Comments)    Unsure of reaction Has patient had a PCN reaction causing immediate rash, facial/tongue/throat swelling, SOB or lightheadedness with hypotension: Unknown Has patient had a PCN reaction causing severe rash involving mucus membranes or skin necrosis: Unknown Has patient had a PCN reaction that required hospitalization: No Has patient had a PCN reaction occurring within the last 10 years: Unknown If all of the above answers are "NO", then may proceed with Cephalosporin use.    . Ace Inhibitors Cough and Other (See Comments)    Tongue swell  . Lisinopril Other (See Comments)    Tongue swell     PHYSICAL EXAM:  ECOG Performance status: 2  Vitals:   11/15/18 1131  BP: (!) 147/59  Pulse: 74  Resp: 16  Temp: (!) 96.7 F (35.9 C)  SpO2: 96%   Filed Weights   11/15/18 1131  Weight: 170 lb 14.4 oz (77.5 kg)    Physical Exam Constitutional:      Appearance: Normal appearance. She is obese.  HENT:     Head: Normocephalic.     Nose: Nose normal.     Mouth/Throat:  Mouth: Mucous membranes are moist.     Pharynx: Oropharynx is clear.  Eyes:     Extraocular Movements: Extraocular movements intact.      Conjunctiva/sclera: Conjunctivae normal.  Neck:     Musculoskeletal: Normal range of motion.  Cardiovascular:     Rate and Rhythm: Normal rate and regular rhythm.     Pulses: Normal pulses.     Heart sounds: Normal heart sounds.  Pulmonary:     Effort: Pulmonary effort is normal.     Breath sounds: Normal breath sounds.  Abdominal:     General: Bowel sounds are normal.     Palpations: Abdomen is soft.  Musculoskeletal:     Right lower leg: Edema present.     Left lower leg: Edema present.  Skin:    General: Skin is warm and dry.  Neurological:     General: No focal deficit present.     Mental Status: She is alert and oriented to person, place, and time.  Psychiatric:        Mood and Affect: Mood normal.        Behavior: Behavior normal.        Thought Content: Thought content normal.        Judgment: Judgment normal.      LABORATORY DATA:  I have reviewed the labs as listed.  CBC    Component Value Date/Time   WBC 7.3 11/15/2018 1056   RBC 2.99 (L) 11/15/2018 1056   HGB 9.3 (L) 11/15/2018 1056   HCT 31.1 (L) 11/15/2018 1056   PLT 201 11/15/2018 1056   MCV 104.0 (H) 11/15/2018 1056   MCH 31.1 11/15/2018 1056   MCHC 29.9 (L) 11/15/2018 1056   RDW 21.2 (H) 11/15/2018 1056   LYMPHSABS 0.5 (L) 11/15/2018 1056   MONOABS 0.5 11/15/2018 1056   EOSABS 0.1 11/15/2018 1056   BASOSABS 0.0 11/15/2018 1056   CMP Latest Ref Rng & Units 11/15/2018 11/08/2018 11/01/2018  Glucose 70 - 99 mg/dL 152(H) 188(H) 196(H)  BUN 8 - 23 mg/dL 36(H) 37(H) 46(H)  Creatinine 0.44 - 1.00 mg/dL 4.99(H) 5.29(H) 5.27(H)  Sodium 135 - 145 mmol/L 142 141 140  Potassium 3.5 - 5.1 mmol/L 3.9 3.8 3.8  Chloride 98 - 111 mmol/L 107 107 108  CO2 22 - 32 mmol/L '24 25 23  ' Calcium 8.9 - 10.3 mg/dL 9.0 8.5(L) 8.6(L)  Total Protein 6.5 - 8.1 g/dL 6.9 6.6 6.3(L)  Total Bilirubin 0.3 - 1.2 mg/dL 0.2(L) 0.1(L) 0.3  Alkaline Phos 38 - 126 U/L 50 48 44  AST 15 - 41 U/L '19 18 19  ' ALT 0 - 44 U/L '10 11 10   ' I have  independently reviewed her scans and discussed with her.     ASSESSMENT & PLAN:   Multiple myeloma ([place]) 1.  IgA lambda plasma cell myeloma, stage II, standard risk: - Labs on 08/29/2018 shows M spike of 0.9 g/dL.  Kappa light chains were 148, lambda light chains 1132, ratio 0.13.  LDH normal.  Beta-2 microglobulin 13.5. -Bone marrow biopsy on 09/20/2018 shows plasma cell myeloma with 30% plasma cells.  Lambda restricted.  FISH panel was normal.  Chromosome analysis was normal. -She had transfusion dependent anemia, last transfusion on 09/13/2018. -Velcade and dexamethasone started on 10/11/2018.  She received 5 weekly injections.  She has tolerated it very well. - We reviewed results from 11/08/2018.  M spike became undetectable.  Lambda light chains improved to 70 (previously 1132).  Free light chain ratio has  improved to 0.39, previously 0.13. - Hemoglobin also improved to 9.3.  Hence we will continue the same regimen. -We will see her back in 2 weeks for follow-up.  I plan to repeat myeloma panel in 4 weeks.  We will also check immunofixation at that time.  If all 3 tests are negative, will consider switching her Velcade to every other week.  2.  Normocytic anemia: -Combination anemia from CKD, plasma cell myeloma and iron deficiency. -Feraheme on 10/14/2018 and 10/25/2018. -Reticulocyte rate increased to 20,000 units on 11/08/2018.  Hemoglobin today improved to 9.3.  3.  Stage I IDC of the right breast, ER/PR positive, HER-2 positive: -She will continue anastrozole.  4.  Osteopenia: -DEXA scan on 05/16/2015 shows osteopenia.  She will continue calcium and vitamin D. - We will plan to repeat bone density in March 2021.  Total time spent is 25 minutes with more than 50% of the time spent face-to-face discussing treatment plan, counseling and coordination of care.    Orders placed this encounter:  No orders of the defined types were placed in this encounter.     Derek Jack, MD  [place] [phone]

## 2018-11-15 NOTE — Assessment & Plan Note (Addendum)
1.  IgA lambda plasma cell myeloma, stage II, standard risk: - Labs on 08/29/2018 shows M spike of 0.9 g/dL.  Kappa light chains were 148, lambda light chains 1132, ratio 0.13.  LDH normal.  Beta-2 microglobulin 13.5. -Bone marrow biopsy on 09/20/2018 shows plasma cell myeloma with 30% plasma cells.  Lambda restricted.  FISH panel was normal.  Chromosome analysis was normal. -She had transfusion dependent anemia, last transfusion on 09/13/2018. -Velcade and dexamethasone started on 10/11/2018.  She received 5 weekly injections.  She has tolerated it very well. - We reviewed results from 11/08/2018.  M spike became undetectable.  Lambda light chains improved to 70 (previously 1132).  Free light chain ratio has improved to 0.39, previously 0.13. - Hemoglobin also improved to 9.3.  Hence we will continue the same regimen. -We will see her back in 2 weeks for follow-up.  I plan to repeat myeloma panel in 4 weeks.  We will also check immunofixation at that time.  If all 3 tests are negative, will consider switching her Velcade to every other week.  2.  Normocytic anemia: -Combination anemia from CKD, plasma cell myeloma and iron deficiency. -Feraheme on 10/14/2018 and 10/25/2018. -Reticulocyte rate increased to 20,000 units on 11/08/2018.  Hemoglobin today improved to 9.3.  3.  Stage I IDC of the right breast, ER/PR positive, HER-2 positive: -She will continue anastrozole.  4.  Osteopenia: -DEXA scan on 05/16/2015 shows osteopenia.  She will continue calcium and vitamin D. - We will plan to repeat bone density in March 2021.

## 2018-11-15 NOTE — Progress Notes (Signed)
Labs reviewed today by Dr. Raliegh Ip - okay to treat w/ velcade and retacrit per Dr. Charlynn Court.   Demetrius Charity, PharmD, [facility] Pharmacist Pharmacy Phone: [phone] 11/15/2018

## 2018-11-16 MED ORDER — LANREOTIDE ACETATE 120 MG/0.5ML ~~LOC~~ SOLN
SUBCUTANEOUS | Status: AC
  Filled 2018-11-16: qty 120

## 2018-11-16 MED ORDER — LIDOCAINE HCL (PF) 1 % IJ SOLN
INTRAMUSCULAR | Status: AC
  Filled 2018-11-16: qty 5

## 2018-11-22 ENCOUNTER — Other Ambulatory Visit: Payer: Self-pay

## 2018-11-22 ENCOUNTER — Encounter (HOSPITAL_COMMUNITY): Payer: Self-pay

## 2018-11-22 ENCOUNTER — Inpatient Hospital Stay (HOSPITAL_COMMUNITY): Payer: Medicare Other

## 2018-11-22 VITALS — BP 155/73 | HR 72 | Temp 96.7°F | Resp 18

## 2018-11-22 DIAGNOSIS — N184 Chronic kidney disease, stage 4 (severe): Secondary | ICD-10-CM

## 2018-11-22 DIAGNOSIS — C9000 Multiple myeloma not having achieved remission: Secondary | ICD-10-CM

## 2018-11-22 DIAGNOSIS — Z5111 Encounter for antineoplastic chemotherapy: Secondary | ICD-10-CM | POA: Diagnosis not present

## 2018-11-22 DIAGNOSIS — D509 Iron deficiency anemia, unspecified: Secondary | ICD-10-CM

## 2018-11-22 LAB — COMPREHENSIVE METABOLIC PANEL
ALT: 12 U/L (ref 0–44)
AST: 18 U/L (ref 15–41)
Albumin: 3.5 g/dL (ref 3.5–5.0)
Alkaline Phosphatase: 49 U/L (ref 38–126)
Anion gap: 11 (ref 5–15)
BUN: 34 mg/dL — ABNORMAL HIGH (ref 8–23)
CO2: 24 mmol/L (ref 22–32)
Calcium: 8.9 mg/dL (ref 8.9–10.3)
Chloride: 106 mmol/L (ref 98–111)
Creatinine, Ser: 5.28 mg/dL — ABNORMAL HIGH (ref 0.44–1.00)
GFR calc Af Amer: 8 mL/min — ABNORMAL LOW (ref >60)
GFR calc non Af Amer: 7 mL/min — ABNORMAL LOW (ref >60)
Glucose, Bld: 159 mg/dL — ABNORMAL HIGH (ref 70–99)
Potassium: 3.8 mmol/L (ref 3.5–5.1)
Sodium: 141 mmol/L (ref 135–145)
Total Bilirubin: 0.6 mg/dL (ref 0.3–1.2)
Total Protein: 6.8 g/dL (ref 6.5–8.1)

## 2018-11-22 LAB — CBC WITH DIFFERENTIAL/PLATELET
Abs Immature Granulocytes: 0.04 10*3/uL (ref 0.00–0.07)
Basophils Absolute: 0 10*3/uL (ref 0.0–0.1)
Basophils Relative: 0 %
Eosinophils Absolute: 0.1 10*3/uL (ref 0.0–0.5)
Eosinophils Relative: 1 %
HCT: 33.3 % — ABNORMAL LOW (ref 36.0–46.0)
Hemoglobin: 10.3 g/dL — ABNORMAL LOW (ref 12.0–15.0)
Immature Granulocytes: 1 %
Lymphocytes Relative: 7 %
Lymphs Abs: 0.5 10*3/uL — ABNORMAL LOW (ref 0.7–4.0)
MCH: 32.1 pg (ref 26.0–34.0)
MCHC: 30.9 g/dL (ref 30.0–36.0)
MCV: 103.7 fL — ABNORMAL HIGH (ref 80.0–100.0)
Monocytes Absolute: 0.6 10*3/uL (ref 0.1–1.0)
Monocytes Relative: 8 %
Neutro Abs: 6.5 10*3/uL (ref 1.7–7.7)
Neutrophils Relative %: 83 %
Platelets: 214 10*3/uL (ref 150–400)
RBC: 3.21 MIL/uL — ABNORMAL LOW (ref 3.87–5.11)
RDW: 20.6 % — ABNORMAL HIGH (ref 11.5–15.5)
WBC: 7.8 10*3/uL (ref 4.0–10.5)
nRBC: 0.3 % — ABNORMAL HIGH (ref 0.0–0.2)

## 2018-11-22 MED ORDER — PROCHLORPERAZINE MALEATE 10 MG PO TABS
10.0000 mg | ORAL_TABLET | Freq: Once | ORAL | Status: AC
  Administered 2018-11-22: 10 mg via ORAL
  Filled 2018-11-22: qty 1

## 2018-11-22 MED ORDER — INFLUENZA VAC A&B SA ADJ QUAD 0.5 ML IM PRSY
0.5000 mL | PREFILLED_SYRINGE | Freq: Once | INTRAMUSCULAR | Status: AC
  Administered 2018-11-22: 0.5 mL via INTRAMUSCULAR

## 2018-11-22 MED ORDER — BORTEZOMIB CHEMO SQ INJECTION 3.5 MG (2.5MG/ML)
1.3000 mg/m2 | Freq: Once | INTRAMUSCULAR | Status: AC
  Administered 2018-11-22: 2.5 mg via SUBCUTANEOUS
  Filled 2018-11-22: qty 1

## 2018-11-22 MED ORDER — INFLUENZA VAC A&B SA ADJ QUAD 0.5 ML IM PRSY
PREFILLED_SYRINGE | INTRAMUSCULAR | Status: AC
  Filled 2018-11-22: qty 0.5

## 2018-11-22 MED ORDER — EPOETIN ALFA-EPBX 10000 UNIT/ML IJ SOLN
20000.0000 [IU] | Freq: Once | INTRAMUSCULAR | Status: AC
  Administered 2018-11-22: 20000 [IU] via SUBCUTANEOUS
  Filled 2018-11-22: qty 2

## 2018-11-22 MED ORDER — DEXAMETHASONE 4 MG PO TABS
10.0000 mg | ORAL_TABLET | Freq: Once | ORAL | Status: AC
  Administered 2018-11-22: 10 mg via ORAL
  Filled 2018-11-22: qty 3

## 2018-11-22 NOTE — Patient Instructions (Signed)
[facility] Discharge Instructions for Patients Receiving Chemotherapy  Today you received the following chemotherapy agents   To help prevent nausea and vomiting after your treatment, we encourage you to take your nausea medication   If you develop nausea and vomiting that is not controlled by your nausea medication, call the clinic.   BELOW ARE SYMPTOMS THAT SHOULD BE REPORTED IMMEDIATELY:  *FEVER GREATER THAN 100.5 F  *CHILLS WITH OR WITHOUT FEVER  NAUSEA AND VOMITING THAT IS NOT CONTROLLED WITH YOUR NAUSEA MEDICATION  *UNUSUAL SHORTNESS OF BREATH  *UNUSUAL BRUISING OR BLEEDING  TENDERNESS IN MOUTH AND THROAT WITH OR WITHOUT PRESENCE OF ULCERS  *URINARY PROBLEMS  *BOWEL PROBLEMS  UNUSUAL RASH Items with * indicate a potential emergency and should be followed up as soon as possible.  Feel free to call the clinic should you have any questions or concerns. The clinic phone number is [phone].  Please show the CHEMO ALERT CARD at check-in to the Emergency Department and triage nurse.   

## 2018-11-22 NOTE — Progress Notes (Signed)
Labs reviewed with Harriet Pho NP, ok to proceed today with velcade.

## 2018-11-23 MED ORDER — LANREOTIDE ACETATE 120 MG/0.5ML ~~LOC~~ SOLN
SUBCUTANEOUS | Status: AC
  Filled 2018-11-23: qty 120

## 2018-11-29 ENCOUNTER — Inpatient Hospital Stay (HOSPITAL_COMMUNITY): Payer: Medicare Other

## 2018-11-29 ENCOUNTER — Other Ambulatory Visit: Payer: Self-pay

## 2018-11-29 ENCOUNTER — Inpatient Hospital Stay (HOSPITAL_COMMUNITY): Payer: Medicare Other | Admitting: Hematology

## 2018-11-29 ENCOUNTER — Encounter (HOSPITAL_COMMUNITY): Payer: Self-pay | Admitting: Hematology

## 2018-11-29 VITALS — BP 155/76 | HR 89 | Temp 97.6°F | Resp 20 | Wt 169.0 lb

## 2018-11-29 DIAGNOSIS — C9000 Multiple myeloma not having achieved remission: Secondary | ICD-10-CM

## 2018-11-29 DIAGNOSIS — Z5111 Encounter for antineoplastic chemotherapy: Secondary | ICD-10-CM | POA: Diagnosis not present

## 2018-11-29 LAB — CBC WITH DIFFERENTIAL/PLATELET
Abs Immature Granulocytes: 0.04 10*3/uL (ref 0.00–0.07)
Basophils Absolute: 0 10*3/uL (ref 0.0–0.1)
Basophils Relative: 0 %
Eosinophils Absolute: 0.1 10*3/uL (ref 0.0–0.5)
Eosinophils Relative: 2 %
HCT: 35.6 % — ABNORMAL LOW (ref 36.0–46.0)
Hemoglobin: 10.9 g/dL — ABNORMAL LOW (ref 12.0–15.0)
Immature Granulocytes: 1 %
Lymphocytes Relative: 10 %
Lymphs Abs: 0.7 10*3/uL (ref 0.7–4.0)
MCH: 32 pg (ref 26.0–34.0)
MCHC: 30.6 g/dL (ref 30.0–36.0)
MCV: 104.4 fL — ABNORMAL HIGH (ref 80.0–100.0)
Monocytes Absolute: 0.4 10*3/uL (ref 0.1–1.0)
Monocytes Relative: 6 %
Neutro Abs: 5.9 10*3/uL (ref 1.7–7.7)
Neutrophils Relative %: 81 %
Platelets: 219 10*3/uL (ref 150–400)
RBC: 3.41 MIL/uL — ABNORMAL LOW (ref 3.87–5.11)
RDW: 19.9 % — ABNORMAL HIGH (ref 11.5–15.5)
WBC: 7.2 10*3/uL (ref 4.0–10.5)
nRBC: 0 % (ref 0.0–0.2)

## 2018-11-29 LAB — COMPREHENSIVE METABOLIC PANEL
ALT: 10 U/L (ref 0–44)
AST: 18 U/L (ref 15–41)
Albumin: 3.5 g/dL (ref 3.5–5.0)
Alkaline Phosphatase: 47 U/L (ref 38–126)
Anion gap: 8 (ref 5–15)
BUN: 33 mg/dL — ABNORMAL HIGH (ref 8–23)
CO2: 27 mmol/L (ref 22–32)
Calcium: 8.9 mg/dL (ref 8.9–10.3)
Chloride: 106 mmol/L (ref 98–111)
Creatinine, Ser: 5.21 mg/dL — ABNORMAL HIGH (ref 0.44–1.00)
GFR calc Af Amer: 8 mL/min — ABNORMAL LOW (ref >60)
GFR calc non Af Amer: 7 mL/min — ABNORMAL LOW (ref >60)
Glucose, Bld: 171 mg/dL — ABNORMAL HIGH (ref 70–99)
Potassium: 3.6 mmol/L (ref 3.5–5.1)
Sodium: 141 mmol/L (ref 135–145)
Total Bilirubin: 0.2 mg/dL — ABNORMAL LOW (ref 0.3–1.2)
Total Protein: 6.5 g/dL (ref 6.5–8.1)

## 2018-11-29 MED ORDER — BORTEZOMIB CHEMO SQ INJECTION 3.5 MG (2.5MG/ML)
1.3000 mg/m2 | Freq: Once | INTRAMUSCULAR | Status: AC
  Administered 2018-11-29: 2.5 mg via SUBCUTANEOUS
  Filled 2018-11-29: qty 1

## 2018-11-29 MED ORDER — PROCHLORPERAZINE MALEATE 10 MG PO TABS
10.0000 mg | ORAL_TABLET | Freq: Once | ORAL | Status: AC
  Administered 2018-11-29: 10 mg via ORAL
  Filled 2018-11-29: qty 1

## 2018-11-29 MED ORDER — DEXAMETHASONE 4 MG PO TABS
10.0000 mg | ORAL_TABLET | Freq: Once | ORAL | Status: AC
  Administered 2018-11-29: 10 mg via ORAL
  Filled 2018-11-29: qty 3

## 2018-11-29 NOTE — Patient Instructions (Addendum)
[facility] Discharge Instructions  You were seen today by Dr. Delton Coombes. He went over your recent lab results. Your Mspike was normal and your light chains have gone down. Your disease has responded well. He would like you to continue taking your injections. He will see you back in 6 weeks for labs and follow up.  TRY taking Melatonin at night for sleep  Thank you for choosing [facility] to provide your oncology and hematology care.  To afford each patient quality time with our provider, please arrive at least 15 minutes before your scheduled appointment time.   If you have a lab appointment with the [facility] please come in thru the  Main Entrance and check in at the main information desk  You need to re-schedule your appointment should you arrive 10 or more minutes late.  We strive to give you quality time with our providers, and arriving late affects you and other patients whose appointments are after yours.  Also, if you no show three or more times for appointments you may be dismissed from the clinic at the providers discretion.     Again, thank you for choosing [facility]  Our hope is that these requests will decrease the amount of time that you wait before being seen by our physicians.       _____________________________________________________________  Should you have questions after your visit to [facility], please contact our office at [phone] between the hours of 8:00 a.m. and 4:30 p.m.  Voicemails left after 4:00 p.m. will not be returned until the following business day.  For prescription refill requests, have your pharmacy contact our office and allow 72 hours.    Cancer Center Support Programs:   > Cancer Support Group  2nd Tuesday of the month 1pm-2pm, Journey Room

## 2018-11-29 NOTE — Progress Notes (Signed)
Carly Wood presents today for Velcade injection. Labs reviewed and patient seen by Dr. Delton Coombes. Pt tolerated Velcade injection without incident or complaint. Discharged in satisfactory condition.

## 2018-11-29 NOTE — Assessment & Plan Note (Signed)
1.  IgA lambda plasma cell myeloma, stage II, standard risk: - Labs on 08/29/2018 shows M spike of 0.9 g/dL.  Kappa light chains were 148, lambda light chains 1132, ratio 0.13.  LDH normal.  Beta-2 microglobulin 13.5. -Bone marrow biopsy on 09/20/2018 shows plasma cell myeloma with 30% plasma cells.  Lambda restricted.  FISH panel was normal.  Chromosome analysis was normal. -She had transfusion dependent anemia, last transfusion on 09/13/2018. -Velcade and dexamethasone started on 10/11/2018.  She received 5 weekly injections.  She has tolerated it very well. - Myeloma panel on 11/08/2018 shows M spike is negative.  Free light chain ratio has improved to 0.39.  Lambda light chains improved from 1132-270. - I will cut back on Velcade to 3 weeks on 1 week off and synchronize with her sisters regimen. -I will see her back in 6 weeks for follow-up.  I plan to repeat myeloma labs in 4 weeks.  2.  Normocytic anemia: -Combination anemia from CKD, plasma cell myeloma and iron deficiency. -Feraheme on 10/14/2018 and 10/25/2018. -Retacrit increased to 20,000 units on 11/08/2018.   3.  Stage I IDC of the right breast, ER/PR positive, HER-2 positive: -She will continue anastrozole.  4.  Osteopenia: -DEXA scan on 05/16/2015 shows osteopenia.  She will continue calcium and vitamin D. - We will plan to repeat bone density in March 2021.

## 2018-11-29 NOTE — Progress Notes (Signed)
[place], [place] [address]   CLINIC:  Medical Oncology/Hematology  PCP:  Monico Blitz, [place] [address] [phone]   REASON FOR VISIT:  Follow-up for plasma cell myeloma and anemia.  CURRENT THERAPY: Velcade, dexamethasone, Procrit and Feraheme.  BRIEF ONCOLOGIC HISTORY:  Oncology History  Stage 1 infiltrating ductal carcinoma of right female breast ([place])  09/12/2014 Mammogram   Mass in upper outer R breast, middle third depth appears slightly larger than it was on prior exam with more irreg spiculated margins   10/02/2014 Pathology Results   biopsy with invasive ductal carcinoma high grade 1.1 cm, dcis solid type. additional R breast tissue, excision, invasive ductal high grade 0.9 cm    10/24/2014 Pathology Results   no residual invasive carcinoma, DCIS, focal, atypical ductal hyperplasia, 0/7 LN positive for metastatic carcinoma ER > 90%, PR 30%, HER 2 2+   10/24/2014 Cancer Staging   T1cN0M0   10/24/2014 Surgery   R mastectomy, T1c, N0   12/28/2014 - 11/22/2015 Chemotherapy   Taxol/herceptin weekly X 12, Herceptin every 21 days, last due in September 2017   03/11/2015 -  Anti-estrogen oral therapy   Arimidex 1 mg daily   09/12/2015 Imaging   MUGA- The left ventricular ejection fraction equals 68%.   06/08/2016 Treatment Plan Change   Started Nerlynx   Multiple myeloma ([place])  10/04/2018 Initial Diagnosis   Multiple myeloma ([place])   10/11/2018 -  Chemotherapy   The patient had bortezomib SQ (VELCADE) chemo injection 2.5 mg, 1.3 mg/m2 = 2.5 mg, Subcutaneous,  Once, 3 of 8 cycles Administration: 2.5 mg (10/11/2018), 2.5 mg (10/18/2018), 2.5 mg (10/25/2018), 2.5 mg (11/01/2018), 2.5 mg (11/08/2018), 2.5 mg (11/15/2018), 2.5 mg (11/22/2018)  for chemotherapy treatment.       CANCER STAGING: Cancer Staging Stage 1 infiltrating ductal carcinoma of right female breast [facility]) Staging form: Breast, AJCC 7th Edition - Clinical  stage from 08/30/2015: Stage IA (T1c, N0, M0) - Signed by Baird Cancer, PA-C on 08/30/2015    INTERVAL HISTORY:  Ms. Benko 83 y.o. female seen for follow-up of her multiple myeloma.  Her only complaint is that she is not able to sleep well at night.  Appetite and energy levels are 75%.  She has one episode of nosebleed since last visit.  It subsided after pinching the nose.  Denies any fevers, night sweats or weight loss.  Denies any tingling or numbness in the extremities.  No recent infections reported.  REVIEW OF SYSTEMS:  Review of Systems  Psychiatric/Behavioral: Positive for sleep disturbance.  All other systems reviewed and are negative.    PAST MEDICAL/SURGICAL HISTORY:  Past Medical History:  Diagnosis Date   Anemia    Anxiety    Chronic kidney disease    Chronic renal disease, stage 4, severely decreased glomerular filtration rate (GFR) between 15-29 mL/min/1.73 square meter ([place]) 06/22/8307   Complication of anesthesia    delirious after Breast Surgery   Dementia ([place])    mild   Depression    Diabetes mellitus without complication ([place])    type II   Dyspnea    with activity   GERD (gastroesophageal reflux disease)    Glaucoma    Hypertension    Pneumonia    Stage 1 infiltrating ductal carcinoma of right female breast ([place]) 08/21/2015   ER+ PR+ HER 2 neu + (3+) T1cN0    Past Surgical History:  Procedure Laterality Date   AV  FISTULA PLACEMENT Left 11/22/2017   Procedure: ARTERIOVENOUS (AV) FISTULA CREATION LEFT ARM;  Surgeon: Elam Dutch, MD;  Location: [place];  Service: Vascular;  Laterality: Left;   BIOPSY  08/07/2016   Procedure: BIOPSY;  Surgeon: Daneil Dolin, MD;  [facility];  Service: Endoscopy;;  gastric ulcer biopsy   COLONOSCOPY     ESOPHAGOGASTRODUODENOSCOPY N/A 08/07/2016   Procedure: ESOPHAGOGASTRODUODENOSCOPY (EGD);  Surgeon: Daneil Dolin, MD;  [facility];  Service: Endoscopy;  Laterality: N/A;   1215-rescheduled to 6/1 @ 2:30pm per Tretha Sciara   ESOPHAGOGASTRODUODENOSCOPY N/A 11/27/2016   Procedure: ESOPHAGOGASTRODUODENOSCOPY (EGD);  Surgeon: Daneil Dolin, MD;  [facility];  Service: Endoscopy;  Laterality: N/A;  8:15am   FISTULA SUPERFICIALIZATION Left 02/14/2018   Procedure: FISTULA SUPERFICIALIZATION LEFT ARM;  Surgeon: Angelia Mould, MD;  Location: [place];  Service: Vascular;  Laterality: Left;   FRACTURE SURGERY Right    ankle   MALONEY DILATION N/A 08/07/2016   Procedure: Venia Minks DILATION;  Surgeon: Daneil Dolin, MD;  [facility];  Service: Endoscopy;  Laterality: N/A;   MASTECTOMY, PARTIAL Right    PORT-A-CATH REMOVAL Left 11/22/2017   Procedure: REMOVAL PORT-A-CATH LEFT CHEST;  Surgeon: Elam Dutch, MD;  [facility];  Service: Vascular;  Laterality: Left;   RETINAL DETACHMENT SURGERY Right      SOCIAL HISTORY:  Social History   Socioeconomic History   Marital status: Single    Spouse name: Not on file   Number of children: Not on file   Years of education: Not on file   Highest education level: Not on file  Occupational History   Not on file  Social Needs   Financial resource strain: Not on file   Food insecurity    Worry: Not on file    Inability: Not on file   Transportation needs    Medical: Not on file    Non-medical: Not on file  Tobacco Use   Smoking status: Never Smoker   Smokeless tobacco: Never Used  Substance and Sexual Activity   Alcohol use: No    Alcohol/week: 0.0 standard drinks   Drug use: No   Sexual activity: Not on file  Lifestyle   Physical activity    Days per week: Not on file    Minutes per session: Not on file   Stress: Not on file  Relationships   Social connections    Talks on phone: Not on file    Gets together: Not on file    Attends religious service: Not on file    Active member of club or organization: Not on file    Attends meetings of clubs or  organizations: Not on file    Relationship status: Not on file   Intimate partner violence    Fear of current or ex partner: Not on file    Emotionally abused: Not on file    Physically abused: Not on file    Forced sexual activity: Not on file  Other Topics Concern   Not on file  Social History Narrative   Not on file    FAMILY HISTORY:  Family History  Problem Relation Age of Onset   Multiple myeloma Sister    Brain cancer Sister    Dementia Mother        died at 34   Stroke Mother    Heart failure Mother    Diabetes Mother    Heart disease Father  Prostate cancer Brother    Colon cancer Neg Hx     CURRENT MEDICATIONS:  Outpatient Encounter Medications as of 11/29/2018  Medication Sig   ACCU-CHEK AVIVA PLUS test strip    acyclovir (ZOVIRAX) 200 MG capsule Take 1 capsule (200 mg total) by mouth 2 (two) times daily.   Alcohol Swabs (ALCOHOL PREP) 70 % PADS See admin instructions.   amLODipine (NORVASC) 10 MG tablet Take 10 mg by mouth daily.   anastrozole (ARIMIDEX) 1 MG tablet TAKE 1 TABLET BY MOUTH DAILY   AQUALANCE LANCETS 30G MISC USE TO test twice daily   aspirin EC 81 MG tablet Take 81 mg by mouth daily.   bortezomib IV (VELCADE) 3.5 MG injection Inject into the vein once a week. Weekly every 21 days   calcitRIOL (ROCALTROL) 0.25 MCG capsule Take 0.25 mcg by mouth daily at 12 noon.    calcium-vitamin D (OSCAL 500/200 D-3) 500-200 MG-UNIT tablet Take 1 tablet by mouth daily at 12 noon.    cloNIDine (CATAPRES) 0.3 MG tablet Take 0.3 mg by mouth 2 (two) times daily.    epoetin alfa (EPOGEN,PROCRIT) 4000 UNIT/ML injection Inject 4,000 Units into the vein.   Lancet Devices (ADJUSTABLE LANCING DEVICE) MISC TO check blood glucose   Multiple Vitamin (MULTIVITAMIN) capsule Take 1 capsule by mouth daily.   ONGLYZA 5 MG TABS tablet Take 5 mg by mouth daily.   PARoxetine (PAXIL) 20 MG tablet Take 20 mg by mouth daily.    pioglitazone (ACTOS) 30  MG tablet Take 15 mg by mouth daily.    pravastatin (PRAVACHOL) 20 MG tablet Take 20 mg by mouth every evening.    TOUJEO SOLOSTAR 300 UNIT/ML SOPN INJECT FIVE UNITS INTO THE SKIN DAILY (270 DAY SUPPLY)   traZODone (DESYREL) 100 MG tablet Take 100 mg by mouth at bedtime.   acetaminophen (TYLENOL) 500 MG tablet Take 500 mg by mouth every 6 (six) hours as needed.    [DISCONTINUED] anti-nausea (EMETROL) solution Take 10 mLs by mouth every 15 (fifteen) minutes as needed for nausea.    No facility-administered encounter medications on file as of 11/29/2018.     ALLERGIES:  Allergies  Allergen Reactions   Penicillins Other (See Comments)    Unsure of reaction Has patient had a PCN reaction causing immediate rash, facial/tongue/throat swelling, SOB or lightheadedness with hypotension: Unknown Has patient had a PCN reaction causing severe rash involving mucus membranes or skin necrosis: Unknown Has patient had a PCN reaction that required hospitalization: No Has patient had a PCN reaction occurring within the last 10 years: Unknown If all of the above answers are "NO", then may proceed with Cephalosporin use.     Ace Inhibitors Cough and Other (See Comments)    Tongue swell   Lisinopril Other (See Comments)    Tongue swell     PHYSICAL EXAM:  ECOG Performance status: 2  Vitals:   11/29/18 1016  BP: (!) 155/76  Pulse: 89  Resp: 20  Temp: 97.6 F (36.4 C)  SpO2: 100%   Filed Weights   11/29/18 1016  Weight: 169 lb (76.7 kg)    Physical Exam Constitutional:      Appearance: Normal appearance. She is obese.  HENT:     Head: Normocephalic.     Nose: Nose normal.     Mouth/Throat:     Mouth: Mucous membranes are moist.     Pharynx: Oropharynx is clear.  Eyes:     Extraocular Movements: Extraocular movements intact.  Conjunctiva/sclera: Conjunctivae normal.  Neck:     Musculoskeletal: Normal range of motion.  Cardiovascular:     Rate and Rhythm: Normal rate and  regular rhythm.     Pulses: Normal pulses.     Heart sounds: Normal heart sounds.  Pulmonary:     Effort: Pulmonary effort is normal.     Breath sounds: Normal breath sounds.  Abdominal:     General: Bowel sounds are normal.     Palpations: Abdomen is soft.  Musculoskeletal:     Right lower leg: Edema present.     Left lower leg: Edema present.  Skin:    General: Skin is warm and dry.  Neurological:     General: No focal deficit present.     Mental Status: She is alert and oriented to person, place, and time.  Psychiatric:        Mood and Affect: Mood normal.        Behavior: Behavior normal.        Thought Content: Thought content normal.        Judgment: Judgment normal.      LABORATORY DATA:  I have reviewed the labs as listed.  CBC    Component Value Date/Time   WBC 7.2 11/29/2018 0857   RBC 3.41 (L) 11/29/2018 0857   HGB 10.9 (L) 11/29/2018 0857   HCT 35.6 (L) 11/29/2018 0857   PLT 219 11/29/2018 0857   MCV 104.4 (H) 11/29/2018 0857   MCH 32.0 11/29/2018 0857   MCHC 30.6 11/29/2018 0857   RDW 19.9 (H) 11/29/2018 0857   LYMPHSABS 0.7 11/29/2018 0857   MONOABS 0.4 11/29/2018 0857   EOSABS 0.1 11/29/2018 0857   BASOSABS 0.0 11/29/2018 0857   CMP Latest Ref Rng & Units 11/29/2018 11/22/2018 11/15/2018  Glucose 70 - 99 mg/dL 171(H) 159(H) 152(H)  BUN 8 - 23 mg/dL 33(H) 34(H) 36(H)  Creatinine 0.44 - 1.00 mg/dL 5.21(H) 5.28(H) 4.99(H)  Sodium 135 - 145 mmol/L 141 141 142  Potassium 3.5 - 5.1 mmol/L 3.6 3.8 3.9  Chloride 98 - 111 mmol/L 106 106 107  CO2 22 - 32 mmol/L _0 Calcium 8.9 - 10.3 mg/dL 8.9 8.9 9.0  Total Protein 6.5 - 8.1 g/dL 6.5 6.8 6.9  Total Bilirubin 0.3 - 1.2 mg/dL 0.2(L) 0.6 0.2(L)  Alkaline Phos 38 - 126 U/L 47 49 50  AST 15 - 41 U/L _1 ALT 0 - 44 U/L _2 I have independently reviewed her scans and discussed with her.     ASSESSMENT & PLAN:   Multiple myeloma ([place]) 1.  IgA lambda plasma cell myeloma, stage II,  standard risk: - Labs on 08/29/2018 shows M spike of 0.9 g/dL.  Kappa light chains were 148, lambda light chains 1132, ratio 0.13.  LDH normal.  Beta-2 microglobulin 13.5. -Bone marrow biopsy on 09/20/2018 shows plasma cell myeloma with 30% plasma cells.  Lambda restricted.  FISH panel was normal.  Chromosome analysis was normal. -She had transfusion dependent anemia, last transfusion on 09/13/2018. -Velcade and dexamethasone started on 10/11/2018.  She received 5 weekly injections.  She has tolerated it very well. - Myeloma panel on 11/08/2018 shows M spike is negative.  Free light chain ratio has improved to 0.39.  Lambda light chains improved from 1132-270. - I will cut back on Velcade to 3 weeks on 1 week off and synchronize with her sisters regimen. -I will see her back in 6 weeks for follow-up.  I plan to repeat myeloma labs in 4 weeks.  2.  Normocytic anemia: -Combination anemia from CKD, plasma cell myeloma and iron deficiency. -Feraheme on 10/14/2018 and 10/25/2018. -Retacrit increased to 20,000 units on 11/08/2018.   3.  Stage I IDC of the right breast, ER/PR positive, HER-2 positive: -She will continue anastrozole.  4.  Osteopenia: -DEXA scan on 05/16/2015 shows osteopenia.  She will continue calcium and vitamin D. - We will plan to repeat bone density in March 2021.  Total time spent is 25 minutes with more than 50% of the time spent face-to-face discussing treatment plan, counseling and coordination of care.    Orders placed this encounter:  Orders Placed This Encounter  Procedures   Protein electrophoresis, serum   Kappa/lambda light chains   Lactate dehydrogenase   CBC with Differential/Platelet   Comprehensive metabolic panel      Derek Jack, MD [place] [phone]

## 2018-11-29 NOTE — Patient Instructions (Signed)
[facility] Discharge Instructions for Patients Receiving Chemotherapy   Beginning January 23rd 2017 lab work for the Cancer Center will be done in the  Main lab at [facility] on 1st floor. If you have a lab appointment with the Cancer Center please come in thru the  Main Entrance and check in at the main information desk   Today you received the following chemotherapy agents Velcade  To help prevent nausea and vomiting after your treatment, we encourage you to take your nausea medication    If you develop nausea and vomiting, or diarrhea that is not controlled by your medication, call the clinic.  The clinic phone number is [phone]. Office hours are Monday-Friday 8:30am-5:00pm.  BELOW ARE SYMPTOMS THAT SHOULD BE REPORTED IMMEDIATELY:  *FEVER GREATER THAN 101.0 F  *CHILLS WITH OR WITHOUT FEVER  NAUSEA AND VOMITING THAT IS NOT CONTROLLED WITH YOUR NAUSEA MEDICATION  *UNUSUAL SHORTNESS OF BREATH  *UNUSUAL BRUISING OR BLEEDING  TENDERNESS IN MOUTH AND THROAT WITH OR WITHOUT PRESENCE OF ULCERS  *URINARY PROBLEMS  *BOWEL PROBLEMS  UNUSUAL RASH Items with * indicate a potential emergency and should be followed up as soon as possible. If you have an emergency after office hours please contact your primary care physician or go to the nearest emergency department.  Please call the clinic during office hours if you have any questions or concerns.   You may also contact the Patient Navigator at [phone] should you have any questions or need assistance in obtaining follow up care.      Resources For Cancer Patients and their Caregivers ? American Cancer Society: Can assist with transportation, wigs, general needs, runs Look Good Feel Better.        1-888-227-6333 ? Cancer Care: Provides financial assistance, online support groups, medication/co-pay assistance.  1-800-813-HOPE (4673) ? [facility] Assists [place] cancer  patients and their families through emotional , educational and financial support.  336-427-4357 ? [place] DSS Where to apply for food stamps, Medicaid and utility assistance. 336-342-1394 ? [facility]: Transportation to medical appointments. 336-347-2287 ? Social Security Administration: May apply for disability if have a Stage IV cancer. 336-342-7796 1-800-772-1213 ? [place] Aging, Disability and Transit Services: Assists with nutrition, care and transit needs. 336-349-2343          

## 2018-12-02 NOTE — Telephone Encounter (Signed)
NA

## 2018-12-06 ENCOUNTER — Inpatient Hospital Stay (HOSPITAL_COMMUNITY): Payer: Medicare Other

## 2018-12-06 ENCOUNTER — Other Ambulatory Visit: Payer: Self-pay

## 2018-12-06 ENCOUNTER — Encounter (HOSPITAL_COMMUNITY): Payer: Self-pay

## 2018-12-06 VITALS — BP 158/75 | HR 80 | Temp 97.5°F | Resp 20

## 2018-12-06 DIAGNOSIS — C9000 Multiple myeloma not having achieved remission: Secondary | ICD-10-CM

## 2018-12-06 DIAGNOSIS — Z5111 Encounter for antineoplastic chemotherapy: Secondary | ICD-10-CM | POA: Diagnosis not present

## 2018-12-06 LAB — COMPREHENSIVE METABOLIC PANEL
ALT: 10 U/L (ref 0–44)
AST: 18 U/L (ref 15–41)
Albumin: 3.5 g/dL (ref 3.5–5.0)
Alkaline Phosphatase: 46 U/L (ref 38–126)
Anion gap: 11 (ref 5–15)
BUN: 39 mg/dL — ABNORMAL HIGH (ref 8–23)
CO2: 26 mmol/L (ref 22–32)
Calcium: 8.6 mg/dL — ABNORMAL LOW (ref 8.9–10.3)
Chloride: 104 mmol/L (ref 98–111)
Creatinine, Ser: 5.51 mg/dL — ABNORMAL HIGH (ref 0.44–1.00)
GFR calc Af Amer: 8 mL/min — ABNORMAL LOW (ref >60)
GFR calc non Af Amer: 7 mL/min — ABNORMAL LOW (ref >60)
Glucose, Bld: 198 mg/dL — ABNORMAL HIGH (ref 70–99)
Potassium: 4.3 mmol/L (ref 3.5–5.1)
Sodium: 141 mmol/L (ref 135–145)
Total Bilirubin: 0.3 mg/dL (ref 0.3–1.2)
Total Protein: 6.5 g/dL (ref 6.5–8.1)

## 2018-12-06 LAB — CBC WITH DIFFERENTIAL/PLATELET
Abs Immature Granulocytes: 0.02 10*3/uL (ref 0.00–0.07)
Basophils Absolute: 0 10*3/uL (ref 0.0–0.1)
Basophils Relative: 0 %
Eosinophils Absolute: 0.1 10*3/uL (ref 0.0–0.5)
Eosinophils Relative: 2 %
HCT: 36.3 % (ref 36.0–46.0)
Hemoglobin: 11 g/dL — ABNORMAL LOW (ref 12.0–15.0)
Immature Granulocytes: 0 %
Lymphocytes Relative: 8 %
Lymphs Abs: 0.6 10*3/uL — ABNORMAL LOW (ref 0.7–4.0)
MCH: 31.6 pg (ref 26.0–34.0)
MCHC: 30.3 g/dL (ref 30.0–36.0)
MCV: 104.3 fL — ABNORMAL HIGH (ref 80.0–100.0)
Monocytes Absolute: 0.4 10*3/uL (ref 0.1–1.0)
Monocytes Relative: 5 %
Neutro Abs: 5.9 10*3/uL (ref 1.7–7.7)
Neutrophils Relative %: 85 %
Platelets: 224 10*3/uL (ref 150–400)
RBC: 3.48 MIL/uL — ABNORMAL LOW (ref 3.87–5.11)
RDW: 18.6 % — ABNORMAL HIGH (ref 11.5–15.5)
WBC: 7 10*3/uL (ref 4.0–10.5)
nRBC: 0 % (ref 0.0–0.2)

## 2018-12-06 MED ORDER — PALONOSETRON HCL INJECTION 0.25 MG/5ML
INTRAVENOUS | Status: AC
  Filled 2018-12-06: qty 5

## 2018-12-06 MED ORDER — DEXAMETHASONE 4 MG PO TABS
10.0000 mg | ORAL_TABLET | Freq: Once | ORAL | Status: AC
  Administered 2018-12-06: 10 mg via ORAL
  Filled 2018-12-06: qty 3

## 2018-12-06 MED ORDER — PROCHLORPERAZINE MALEATE 10 MG PO TABS
10.0000 mg | ORAL_TABLET | Freq: Once | ORAL | Status: AC
  Administered 2018-12-06: 10 mg via ORAL
  Filled 2018-12-06: qty 1

## 2018-12-06 MED ORDER — LANREOTIDE ACETATE 120 MG/0.5ML ~~LOC~~ SOLN
SUBCUTANEOUS | Status: AC
  Filled 2018-12-06: qty 120

## 2018-12-06 MED ORDER — BORTEZOMIB CHEMO SQ INJECTION 3.5 MG (2.5MG/ML)
1.3000 mg/m2 | Freq: Once | INTRAMUSCULAR | Status: AC
  Administered 2018-12-06: 2.5 mg via SUBCUTANEOUS
  Filled 2018-12-06: qty 1

## 2018-12-06 NOTE — Progress Notes (Signed)
1200 Labs reviewed with RNester NP and pt approved for Velcade injection today but we will hold Retacrit injection for Hgb of 11 today per NP                                                            Carly Wood tolerated Velcade injection well without complaints or incident. VSS Pt discharged via wheelchair in satisfactory condition accompanied by her sister

## 2018-12-06 NOTE — Progress Notes (Signed)
12/06/18  Per Dr Delton Coombes orders patient will be 3 weeks on and 1 week off.  She will be off next week so appt will be canceled.  Treatment plan adjusted to reflect this.  Ok to treat with labs.   V.O. Dr Rhys Martini, PharmD

## 2018-12-06 NOTE — Patient Instructions (Signed)
[facility] Discharge Instructions for Patients Receiving Chemotherapy   Beginning January 23rd 2017 lab work for the [facility] will be done in the  Main lab at [facility] on 1st floor. If you have a lab appointment with the [facility] please come in thru the  Main Entrance and check in at the main information desk   Today you received the following chemotherapy agents Velcade injection. Follow-up as scheduled. Call clinic for any questions or cocnerns  To help prevent nausea and vomiting after your treatment, we encourage you to take your nausea medication   If you develop nausea and vomiting, or diarrhea that is not controlled by your medication, call the clinic.  The clinic phone number is [phone]. Office hours are Monday-Friday 8:30am-5:00pm.  BELOW ARE SYMPTOMS THAT SHOULD BE REPORTED IMMEDIATELY:  *FEVER GREATER THAN 101.0 F  *CHILLS WITH OR WITHOUT FEVER  NAUSEA AND VOMITING THAT IS NOT CONTROLLED WITH YOUR NAUSEA MEDICATION  *UNUSUAL SHORTNESS OF BREATH  *UNUSUAL BRUISING OR BLEEDING  TENDERNESS IN MOUTH AND THROAT WITH OR WITHOUT PRESENCE OF ULCERS  *URINARY PROBLEMS  *BOWEL PROBLEMS  UNUSUAL RASH Items with * indicate a potential emergency and should be followed up as soon as possible. If you have an emergency after office hours please contact your primary care physician or go to the nearest emergency department.  Please call the clinic during office hours if you have any questions or concerns.   You may also contact the Patient Navigator at [phone] should you have any questions or need assistance in obtaining follow up care.      Resources For Cancer Patients and their Caregivers ? American Cancer Society: Can assist with transportation, wigs, general needs, runs Look Good Feel Better.        878-149-4960 ? Cancer Care: Provides financial assistance, online support groups, medication/co-pay assistance.   1-800-813-HOPE (954)781-3203) ? [facility] Assists [place] cancer patients and their families through emotional , educational and financial support.  605-464-9930 ? [place] DSS Where to apply for food stamps, Medicaid and utility assistance. 309-463-6742 ? [facility]: Transportation to medical appointments. 4187314432 ? Social Security Administration: May apply for disability if have a Stage IV cancer. (787) 865-2443 6133772305 ? [employer], Disability and Transit Services: Assists with nutrition, care and transit needs. 8325437602

## 2018-12-07 MED ORDER — OCTREOTIDE ACETATE 30 MG IM KIT
PACK | INTRAMUSCULAR | Status: AC
  Filled 2018-12-07: qty 1

## 2018-12-08 MED ORDER — LANREOTIDE ACETATE 120 MG/0.5ML ~~LOC~~ SOLN
SUBCUTANEOUS | Status: AC
  Filled 2018-12-08: qty 120

## 2018-12-08 MED ORDER — OCTREOTIDE ACETATE 20 MG IM KIT
PACK | INTRAMUSCULAR | Status: AC
  Filled 2018-12-08: qty 1

## 2018-12-13 ENCOUNTER — Ambulatory Visit (HOSPITAL_COMMUNITY): Payer: Medicare Other

## 2018-12-13 ENCOUNTER — Other Ambulatory Visit (HOSPITAL_COMMUNITY): Payer: Medicare Other

## 2018-12-20 ENCOUNTER — Other Ambulatory Visit: Payer: Self-pay

## 2018-12-20 ENCOUNTER — Inpatient Hospital Stay (HOSPITAL_COMMUNITY): Payer: Medicare Other | Attending: Hematology

## 2018-12-20 ENCOUNTER — Inpatient Hospital Stay (HOSPITAL_COMMUNITY): Payer: Medicare Other

## 2018-12-20 VITALS — BP 133/66 | HR 77 | Temp 97.4°F | Resp 20 | Wt 170.0 lb

## 2018-12-20 DIAGNOSIS — N189 Chronic kidney disease, unspecified: Secondary | ICD-10-CM | POA: Diagnosis not present

## 2018-12-20 DIAGNOSIS — C9000 Multiple myeloma not having achieved remission: Secondary | ICD-10-CM | POA: Diagnosis present

## 2018-12-20 DIAGNOSIS — D631 Anemia in chronic kidney disease: Secondary | ICD-10-CM | POA: Diagnosis not present

## 2018-12-20 DIAGNOSIS — Z5111 Encounter for antineoplastic chemotherapy: Secondary | ICD-10-CM | POA: Diagnosis not present

## 2018-12-20 DIAGNOSIS — M8580 Other specified disorders of bone density and structure, unspecified site: Secondary | ICD-10-CM | POA: Insufficient documentation

## 2018-12-20 LAB — COMPREHENSIVE METABOLIC PANEL
ALT: 11 U/L (ref 0–44)
AST: 19 U/L (ref 15–41)
Albumin: 3.4 g/dL — ABNORMAL LOW (ref 3.5–5.0)
Alkaline Phosphatase: 44 U/L (ref 38–126)
Anion gap: 12 (ref 5–15)
BUN: 37 mg/dL — ABNORMAL HIGH (ref 8–23)
CO2: 25 mmol/L (ref 22–32)
Calcium: 8.8 mg/dL — ABNORMAL LOW (ref 8.9–10.3)
Chloride: 104 mmol/L (ref 98–111)
Creatinine, Ser: 5.24 mg/dL — ABNORMAL HIGH (ref 0.44–1.00)
GFR calc Af Amer: 8 mL/min — ABNORMAL LOW (ref >60)
GFR calc non Af Amer: 7 mL/min — ABNORMAL LOW (ref >60)
Glucose, Bld: 153 mg/dL — ABNORMAL HIGH (ref 70–99)
Potassium: 3.8 mmol/L (ref 3.5–5.1)
Sodium: 141 mmol/L (ref 135–145)
Total Bilirubin: 0.4 mg/dL (ref 0.3–1.2)
Total Protein: 6.6 g/dL (ref 6.5–8.1)

## 2018-12-20 LAB — CBC WITH DIFFERENTIAL/PLATELET
Abs Immature Granulocytes: 0.02 10*3/uL (ref 0.00–0.07)
Basophils Absolute: 0.1 10*3/uL (ref 0.0–0.1)
Basophils Relative: 1 %
Eosinophils Absolute: 0.2 10*3/uL (ref 0.0–0.5)
Eosinophils Relative: 3 %
HCT: 35.3 % — ABNORMAL LOW (ref 36.0–46.0)
Hemoglobin: 11.1 g/dL — ABNORMAL LOW (ref 12.0–15.0)
Immature Granulocytes: 0 %
Lymphocytes Relative: 14 %
Lymphs Abs: 0.7 10*3/uL (ref 0.7–4.0)
MCH: 32.2 pg (ref 26.0–34.0)
MCHC: 31.4 g/dL (ref 30.0–36.0)
MCV: 102.3 fL — ABNORMAL HIGH (ref 80.0–100.0)
Monocytes Absolute: 0.4 10*3/uL (ref 0.1–1.0)
Monocytes Relative: 7 %
Neutro Abs: 4 10*3/uL (ref 1.7–7.7)
Neutrophils Relative %: 75 %
Platelets: 254 10*3/uL (ref 150–400)
RBC: 3.45 MIL/uL — ABNORMAL LOW (ref 3.87–5.11)
RDW: 17 % — ABNORMAL HIGH (ref 11.5–15.5)
WBC: 5.3 10*3/uL (ref 4.0–10.5)
nRBC: 0 % (ref 0.0–0.2)

## 2018-12-20 MED ORDER — BORTEZOMIB CHEMO SQ INJECTION 3.5 MG (2.5MG/ML)
1.3000 mg/m2 | Freq: Once | INTRAMUSCULAR | Status: AC
  Administered 2018-12-20: 2.5 mg via SUBCUTANEOUS
  Filled 2018-12-20: qty 1

## 2018-12-20 MED ORDER — DEXAMETHASONE 4 MG PO TABS
10.0000 mg | ORAL_TABLET | Freq: Once | ORAL | Status: AC
  Administered 2018-12-20: 11:00:00 10 mg via ORAL
  Filled 2018-12-20: qty 3

## 2018-12-20 MED ORDER — PROCHLORPERAZINE MALEATE 10 MG PO TABS
10.0000 mg | ORAL_TABLET | Freq: Once | ORAL | Status: AC
  Administered 2018-12-20: 11:00:00 10 mg via ORAL
  Filled 2018-12-20: qty 1

## 2018-12-20 NOTE — Progress Notes (Signed)
Hgb 11.1 today.   Patient tolerated injection with no complaints voiced.  Site clean and dry with no bruising or swelling noted at site.  Band aid applied.  Vss with discharge and left by wheelchair with no s/s of distress noted.

## 2018-12-20 NOTE — Progress Notes (Signed)
Okay to treat today per Dr. Raliegh Ip - he would like treatment parameters to reflect SCr cutoff of >5.8 for future velcade treatments. This has been updated.   Carly Wood, PharmD, [facility] Pharmacist Pharmacy Phone: [phone] 12/20/2018

## 2018-12-21 MED ORDER — LANREOTIDE ACETATE 120 MG/0.5ML ~~LOC~~ SOLN
SUBCUTANEOUS | Status: AC
  Filled 2018-12-21: qty 120

## 2018-12-26 ENCOUNTER — Other Ambulatory Visit: Payer: Self-pay

## 2018-12-27 ENCOUNTER — Inpatient Hospital Stay (HOSPITAL_COMMUNITY): Payer: Medicare Other

## 2018-12-27 VITALS — BP 137/76 | HR 75 | Temp 97.3°F | Resp 18

## 2018-12-27 DIAGNOSIS — N184 Chronic kidney disease, stage 4 (severe): Secondary | ICD-10-CM

## 2018-12-27 DIAGNOSIS — C9000 Multiple myeloma not having achieved remission: Secondary | ICD-10-CM

## 2018-12-27 DIAGNOSIS — Z5111 Encounter for antineoplastic chemotherapy: Secondary | ICD-10-CM | POA: Diagnosis not present

## 2018-12-27 DIAGNOSIS — D509 Iron deficiency anemia, unspecified: Secondary | ICD-10-CM

## 2018-12-27 LAB — CBC WITH DIFFERENTIAL/PLATELET
Abs Immature Granulocytes: 0.02 10*3/uL (ref 0.00–0.07)
Basophils Absolute: 0 10*3/uL (ref 0.0–0.1)
Basophils Relative: 0 %
Eosinophils Absolute: 0.1 10*3/uL (ref 0.0–0.5)
Eosinophils Relative: 2 %
HCT: 34 % — ABNORMAL LOW (ref 36.0–46.0)
Hemoglobin: 10.5 g/dL — ABNORMAL LOW (ref 12.0–15.0)
Immature Granulocytes: 0 %
Lymphocytes Relative: 11 %
Lymphs Abs: 0.7 10*3/uL (ref 0.7–4.0)
MCH: 31.3 pg (ref 26.0–34.0)
MCHC: 30.9 g/dL (ref 30.0–36.0)
MCV: 101.5 fL — ABNORMAL HIGH (ref 80.0–100.0)
Monocytes Absolute: 0.4 10*3/uL (ref 0.1–1.0)
Monocytes Relative: 6 %
Neutro Abs: 4.7 10*3/uL (ref 1.7–7.7)
Neutrophils Relative %: 81 %
Platelets: 196 10*3/uL (ref 150–400)
RBC: 3.35 MIL/uL — ABNORMAL LOW (ref 3.87–5.11)
RDW: 16.7 % — ABNORMAL HIGH (ref 11.5–15.5)
WBC: 5.9 10*3/uL (ref 4.0–10.5)
nRBC: 0 % (ref 0.0–0.2)

## 2018-12-27 LAB — COMPREHENSIVE METABOLIC PANEL
ALT: 10 U/L (ref 0–44)
AST: 20 U/L (ref 15–41)
Albumin: 3.5 g/dL (ref 3.5–5.0)
Alkaline Phosphatase: 43 U/L (ref 38–126)
Anion gap: 11 (ref 5–15)
BUN: 42 mg/dL — ABNORMAL HIGH (ref 8–23)
CO2: 24 mmol/L (ref 22–32)
Calcium: 8.7 mg/dL — ABNORMAL LOW (ref 8.9–10.3)
Chloride: 104 mmol/L (ref 98–111)
Creatinine, Ser: 5.27 mg/dL — ABNORMAL HIGH (ref 0.44–1.00)
GFR calc Af Amer: 8 mL/min — ABNORMAL LOW (ref >60)
GFR calc non Af Amer: 7 mL/min — ABNORMAL LOW (ref >60)
Glucose, Bld: 209 mg/dL — ABNORMAL HIGH (ref 70–99)
Potassium: 3.9 mmol/L (ref 3.5–5.1)
Sodium: 139 mmol/L (ref 135–145)
Total Bilirubin: 0.5 mg/dL (ref 0.3–1.2)
Total Protein: 6.3 g/dL — ABNORMAL LOW (ref 6.5–8.1)

## 2018-12-27 LAB — LACTATE DEHYDROGENASE: LDH: 162 U/L (ref 98–192)

## 2018-12-27 MED ORDER — EPOETIN ALFA-EPBX 10000 UNIT/ML IJ SOLN
20000.0000 [IU] | Freq: Once | INTRAMUSCULAR | Status: AC
  Administered 2018-12-27: 20000 [IU] via SUBCUTANEOUS

## 2018-12-27 MED ORDER — PROCHLORPERAZINE MALEATE 10 MG PO TABS
10.0000 mg | ORAL_TABLET | Freq: Once | ORAL | Status: AC
  Administered 2018-12-27: 10 mg via ORAL

## 2018-12-27 MED ORDER — DEXAMETHASONE 4 MG PO TABS
10.0000 mg | ORAL_TABLET | Freq: Once | ORAL | Status: AC
  Administered 2018-12-27: 10 mg via ORAL

## 2018-12-27 MED ORDER — DEXAMETHASONE 4 MG PO TABS
ORAL_TABLET | ORAL | Status: AC
  Filled 2018-12-27: qty 3

## 2018-12-27 MED ORDER — BORTEZOMIB CHEMO SQ INJECTION 3.5 MG (2.5MG/ML)
1.3000 mg/m2 | Freq: Once | INTRAMUSCULAR | Status: AC
  Administered 2018-12-27: 2.5 mg via SUBCUTANEOUS
  Filled 2018-12-27: qty 1

## 2018-12-27 MED ORDER — EPOETIN ALFA-EPBX 10000 UNIT/ML IJ SOLN
INTRAMUSCULAR | Status: AC
  Filled 2018-12-27: qty 2

## 2018-12-27 MED ORDER — PROCHLORPERAZINE MALEATE 10 MG PO TABS
ORAL_TABLET | ORAL | Status: AC
  Filled 2018-12-27: qty 1

## 2018-12-27 NOTE — Progress Notes (Signed)
Carly Wood presents today for injection per MD orders. Retacrit administered SQ in right Upper Arm. Administration without incident. Patient tolerated well.  Velcade given today per MD orders. Tolerated  without adverse affects. Vital signs stable. No complaints at this time. Discharged from clinic via wheel chair. F/U with [facility] as scheduled.

## 2018-12-28 LAB — KAPPA/LAMBDA LIGHT CHAINS
Kappa free light chain: 101.8 mg/L — ABNORMAL HIGH (ref 3.3–19.4)
Kappa, lambda light chain ratio: 0.63 (ref 0.26–1.65)
Lambda free light chains: 161.7 mg/L — ABNORMAL HIGH (ref 5.7–26.3)

## 2018-12-28 MED ORDER — OCTREOTIDE ACETATE 30 MG IM KIT
PACK | INTRAMUSCULAR | Status: AC
  Filled 2018-12-28: qty 1

## 2018-12-28 MED ORDER — DIPHENHYDRAMINE HCL 25 MG PO CAPS
ORAL_CAPSULE | ORAL | Status: AC
  Filled 2018-12-28: qty 1

## 2018-12-29 LAB — PROTEIN ELECTROPHORESIS, SERUM
A/G Ratio: 1.3 (ref 0.7–1.7)
Albumin ELP: 3.2 g/dL (ref 2.9–4.4)
Alpha-1-Globulin: 0.2 g/dL (ref 0.0–0.4)
Alpha-2-Globulin: 0.9 g/dL (ref 0.4–1.0)
Beta Globulin: 0.8 g/dL (ref 0.7–1.3)
Gamma Globulin: 0.6 g/dL (ref 0.4–1.8)
Globulin, Total: 2.5 g/dL (ref 2.2–3.9)
Total Protein ELP: 5.7 g/dL — ABNORMAL LOW (ref 6.0–8.5)

## 2018-12-29 MED ORDER — LIDOCAINE HCL (PF) 1 % IJ SOLN
INTRAMUSCULAR | Status: AC
  Filled 2018-12-29: qty 10

## 2019-01-03 ENCOUNTER — Inpatient Hospital Stay (HOSPITAL_COMMUNITY): Payer: Medicare Other

## 2019-01-03 ENCOUNTER — Other Ambulatory Visit: Payer: Self-pay

## 2019-01-03 ENCOUNTER — Encounter (HOSPITAL_COMMUNITY): Payer: Self-pay | Admitting: Hematology

## 2019-01-03 ENCOUNTER — Inpatient Hospital Stay (HOSPITAL_COMMUNITY): Payer: Medicare Other | Admitting: Hematology

## 2019-01-03 VITALS — BP 136/74 | HR 75 | Temp 97.5°F | Resp 20 | Wt 169.8 lb

## 2019-01-03 DIAGNOSIS — Z5111 Encounter for antineoplastic chemotherapy: Secondary | ICD-10-CM | POA: Diagnosis not present

## 2019-01-03 DIAGNOSIS — C9000 Multiple myeloma not having achieved remission: Secondary | ICD-10-CM

## 2019-01-03 LAB — COMPREHENSIVE METABOLIC PANEL
ALT: 11 U/L (ref 0–44)
AST: 19 U/L (ref 15–41)
Albumin: 3.4 g/dL — ABNORMAL LOW (ref 3.5–5.0)
Alkaline Phosphatase: 46 U/L (ref 38–126)
Anion gap: 11 (ref 5–15)
BUN: 39 mg/dL — ABNORMAL HIGH (ref 8–23)
CO2: 23 mmol/L (ref 22–32)
Calcium: 8.7 mg/dL — ABNORMAL LOW (ref 8.9–10.3)
Chloride: 104 mmol/L (ref 98–111)
Creatinine, Ser: 5.17 mg/dL — ABNORMAL HIGH (ref 0.44–1.00)
GFR calc Af Amer: 8 mL/min — ABNORMAL LOW (ref >60)
GFR calc non Af Amer: 7 mL/min — ABNORMAL LOW (ref >60)
Glucose, Bld: 115 mg/dL — ABNORMAL HIGH (ref 70–99)
Potassium: 4.2 mmol/L (ref 3.5–5.1)
Sodium: 138 mmol/L (ref 135–145)
Total Bilirubin: 0.3 mg/dL (ref 0.3–1.2)
Total Protein: 6.4 g/dL — ABNORMAL LOW (ref 6.5–8.1)

## 2019-01-03 LAB — CBC WITH DIFFERENTIAL/PLATELET
Abs Immature Granulocytes: 0.04 10*3/uL (ref 0.00–0.07)
Basophils Absolute: 0.1 10*3/uL (ref 0.0–0.1)
Basophils Relative: 1 %
Eosinophils Absolute: 0.2 10*3/uL (ref 0.0–0.5)
Eosinophils Relative: 2 %
HCT: 36.4 % (ref 36.0–46.0)
Hemoglobin: 11.4 g/dL — ABNORMAL LOW (ref 12.0–15.0)
Immature Granulocytes: 1 %
Lymphocytes Relative: 11 %
Lymphs Abs: 0.9 10*3/uL (ref 0.7–4.0)
MCH: 31.8 pg (ref 26.0–34.0)
MCHC: 31.3 g/dL (ref 30.0–36.0)
MCV: 101.4 fL — ABNORMAL HIGH (ref 80.0–100.0)
Monocytes Absolute: 0.6 10*3/uL (ref 0.1–1.0)
Monocytes Relative: 7 %
Neutro Abs: 6.6 10*3/uL (ref 1.7–7.7)
Neutrophils Relative %: 78 %
Platelets: 226 10*3/uL (ref 150–400)
RBC: 3.59 MIL/uL — ABNORMAL LOW (ref 3.87–5.11)
RDW: 17 % — ABNORMAL HIGH (ref 11.5–15.5)
WBC: 8.3 10*3/uL (ref 4.0–10.5)
nRBC: 0 % (ref 0.0–0.2)

## 2019-01-03 MED ORDER — PROCHLORPERAZINE MALEATE 10 MG PO TABS
10.0000 mg | ORAL_TABLET | Freq: Once | ORAL | Status: AC
  Administered 2019-01-03: 10 mg via ORAL
  Filled 2019-01-03: qty 1

## 2019-01-03 MED ORDER — DEXAMETHASONE 4 MG PO TABS
10.0000 mg | ORAL_TABLET | Freq: Once | ORAL | Status: AC
  Administered 2019-01-03: 10 mg via ORAL
  Filled 2019-01-03: qty 3

## 2019-01-03 MED ORDER — BORTEZOMIB CHEMO SQ INJECTION 3.5 MG (2.5MG/ML)
1.3000 mg/m2 | Freq: Once | INTRAMUSCULAR | Status: AC
  Administered 2019-01-03: 16:00:00 2.5 mg via SUBCUTANEOUS
  Filled 2019-01-03: qty 1

## 2019-01-03 NOTE — Patient Instructions (Signed)
[place] [facility] Discharge Instructions  You were seen today by Dr. Delton Coombes. He went over your recent lab results. Continue your injections and labs. He will see you back in 6 weeks for labs and follow up.   Thank you for choosing [place] at [facility] to provide your oncology and hematology care.  To afford each patient quality time with our provider, please arrive at least 15 minutes before your scheduled appointment time.   If you have a lab appointment with the [facility] please come in thru the  Main Entrance and check in at the main information desk  You need to re-schedule your appointment should you arrive 10 or more minutes late.  We strive to give you quality time with our providers, and arriving late affects you and other patients whose appointments are after yours.  Also, if you no show three or more times for appointments you may be dismissed from the clinic at the providers discretion.     Again, thank you for choosing [facility].  Our hope is that these requests will decrease the amount of time that you wait before being seen by our physicians.       _____________________________________________________________  Should you have questions after your visit to [facility], please contact our office at [phone] between the hours of 8:00 a.m. and 4:30 p.m.  Voicemails left after 4:00 p.m. will not be returned until the following business day.  For prescription refill requests, have your pharmacy contact our office and allow 72 hours.    Cancer Center Support Programs:   > Cancer Support Group  2nd Tuesday of the month 1pm-2pm, Journey Room

## 2019-01-03 NOTE — Patient Instructions (Signed)
[facility] Discharge Instructions for Patients Receiving Chemotherapy  Today you received the following chemotherapy agents   To help prevent nausea and vomiting after your treatment, we encourage you to take your nausea medication   If you develop nausea and vomiting that is not controlled by your nausea medication, call the clinic.   BELOW ARE SYMPTOMS THAT SHOULD BE REPORTED IMMEDIATELY:  *FEVER GREATER THAN 100.5 F  *CHILLS WITH OR WITHOUT FEVER  NAUSEA AND VOMITING THAT IS NOT CONTROLLED WITH YOUR NAUSEA MEDICATION  *UNUSUAL SHORTNESS OF BREATH  *UNUSUAL BRUISING OR BLEEDING  TENDERNESS IN MOUTH AND THROAT WITH OR WITHOUT PRESENCE OF ULCERS  *URINARY PROBLEMS  *BOWEL PROBLEMS  UNUSUAL RASH Items with * indicate a potential emergency and should be followed up as soon as possible.  Feel free to call the clinic should you have any questions or concerns. The clinic phone number is [phone].  Please show the CHEMO ALERT CARD at check-in to the Emergency Department and triage nurse.   

## 2019-01-03 NOTE — Assessment & Plan Note (Signed)
1.  IgA lambda plasma cell myeloma, stage II, standard risk: - Labs on 08/29/2018 shows M spike of 0.9 g/dL.  Kappa light chains were 148, lambda light chains 1132, ratio 0.13.  LDH normal.  Beta-2 microglobulin 13.5. -Bone marrow biopsy on 09/20/2018 shows plasma cell myeloma with 30% plasma cells.  Lambda restricted.  FISH panel was normal.  Chromosome analysis was normal. -She had transfusion dependent anemia, last transfusion on 09/13/2018. -Velcade and dexamethasone started on 10/11/2018.  We have changed it to 3 weeks on 1 week off. -We reviewed myeloma labs from 12/27/2018.  SPEP did not show any M spike.  Free lambda light chains have improved to 161 from 270.  Ratio is 0.63. -We will continue the same regimen of Velcade and dexamethasone 3 weeks on 1 week off.  She will be seen back in 6 weeks for follow-up with repeat myeloma panel.  2.  Normocytic anemia: -Combination anemia from CKD, plasma cell myeloma and IDA. -Intermittent Feraheme infusions, last one on 10/25/2018. -She is receiving Retacrit 20,000 units as needed.  3.  Stage I IDC of the right breast, ER/PR positive, HER-2 positive: -She will continue anastrozole.  She has mild right upper extremity lymphedema.  4.  Osteopenia: -DEXA scan on 05/16/2015 shows osteopenia.  She will continue calcium and vitamin D. - We will plan to repeat bone density in March 2021.

## 2019-01-03 NOTE — Progress Notes (Signed)
This encounter was created in error - please disregard.

## 2019-01-03 NOTE — Progress Notes (Signed)
Labs meet parameters for treatment today. Creatinine 5.17, noted by MD.  Treatment given per orders. Patient tolerated it well without problems. Vitals stable and discharged home from clinic via wheelchair . Follow up as scheduled.

## 2019-01-03 NOTE — Progress Notes (Signed)
[place], [place] [address]   CLINIC:  Medical Oncology/Hematology  PCP:  Monico Blitz, [place] [address] [phone]   REASON FOR VISIT:  Follow-up for plasma cell myeloma and anemia.  CURRENT THERAPY: Velcade, dexamethasone, Procrit and Feraheme.  BRIEF ONCOLOGIC HISTORY:  Oncology History  Stage 1 infiltrating ductal carcinoma of right female breast ([place])  09/12/2014 Mammogram   Mass in upper outer R breast, middle third depth appears slightly larger than it was on prior exam with more irreg spiculated margins   10/02/2014 Pathology Results   biopsy with invasive ductal carcinoma high grade 1.1 cm, dcis solid type. additional R breast tissue, excision, invasive ductal high grade 0.9 cm    10/24/2014 Pathology Results   no residual invasive carcinoma, DCIS, focal, atypical ductal hyperplasia, 0/7 LN positive for metastatic carcinoma ER > 90%, PR 30%, HER 2 2+   10/24/2014 Cancer Staging   T1cN0M0   10/24/2014 Surgery   R mastectomy, T1c, N0   12/28/2014 - 11/22/2015 Chemotherapy   Taxol/herceptin weekly X 12, Herceptin every 21 days, last due in September 2017   03/11/2015 -  Anti-estrogen oral therapy   Arimidex 1 mg daily   09/12/2015 Imaging   MUGA- The left ventricular ejection fraction equals 68%.   06/08/2016 Treatment Plan Change   Started Nerlynx   Multiple myeloma ([place])  10/04/2018 Initial Diagnosis   Multiple myeloma ([place])   10/11/2018 -  Chemotherapy   The patient had bortezomib SQ (VELCADE) chemo injection 2.5 mg, 1.3 mg/m2 = 2.5 mg, Subcutaneous,  Once, 4 of 8 cycles Administration: 2.5 mg (10/11/2018), 2.5 mg (10/18/2018), 2.5 mg (10/25/2018), 2.5 mg (11/01/2018), 2.5 mg (11/08/2018), 2.5 mg (11/15/2018), 2.5 mg (11/22/2018), 2.5 mg (11/29/2018), 2.5 mg (12/06/2018), 2.5 mg (12/20/2018), 2.5 mg (12/27/2018), 2.5 mg (01/03/2019)  for chemotherapy treatment.       CANCER STAGING: Cancer Staging Stage 1  infiltrating ductal carcinoma of right female breast [facility]) Staging form: Breast, AJCC 7th Edition - Clinical stage from 08/30/2015: Stage IA (T1c, N0, M0) - Signed by Baird Cancer, PA-C on 08/30/2015    INTERVAL HISTORY:  Ms. Verma 83 y.o. female seen for follow-up of multiple myeloma.  She is tolerating Velcade 3 weeks on 1 week off very well.  Denies any tingling or numbness in extremities.  Reports swelling in the right upper extremity.  Swelling in the lower extremity has been stable.  Appetite and energy levels are 100%.  Sleep problems are helped by taking Benadryl at nighttime.  REVIEW OF SYSTEMS:  Review of Systems  Psychiatric/Behavioral: Positive for sleep disturbance.  All other systems reviewed and are negative.    PAST MEDICAL/SURGICAL HISTORY:  Past Medical History:  Diagnosis Date   Anemia    Anxiety    Chronic kidney disease    Chronic renal disease, stage 4, severely decreased glomerular filtration rate (GFR) between 15-29 mL/min/1.73 square meter ([place]) 1/97/5883   Complication of anesthesia    delirious after Breast Surgery   Dementia ([place])    mild   Depression    Diabetes mellitus without complication ([place])    type II   Dyspnea    with activity   GERD (gastroesophageal reflux disease)    Glaucoma    Hypertension    Pneumonia    Stage 1 infiltrating ductal carcinoma of right female breast ([place]) 08/21/2015   ER+ PR+ HER 2 neu + (3+) T1cN0    Past Surgical History:  Procedure Laterality Date   AV FISTULA PLACEMENT Left 11/22/2017   Procedure: ARTERIOVENOUS (AV) FISTULA CREATION LEFT ARM;  Surgeon: Elam Dutch, MD;  Location: [place];  Service: Vascular;  Laterality: Left;   BIOPSY  08/07/2016   Procedure: BIOPSY;  Surgeon: Daneil Dolin, MD;  [facility];  Service: Endoscopy;;  gastric ulcer biopsy   COLONOSCOPY     ESOPHAGOGASTRODUODENOSCOPY N/A 08/07/2016   Procedure: ESOPHAGOGASTRODUODENOSCOPY (EGD);  Surgeon: Daneil Dolin, MD;  [facility];  Service: Endoscopy;  Laterality: N/A;  1215-rescheduled to 6/1 @ 2:30pm per Tretha Sciara   ESOPHAGOGASTRODUODENOSCOPY N/A 11/27/2016   Procedure: ESOPHAGOGASTRODUODENOSCOPY (EGD);  Surgeon: Daneil Dolin, MD;  [facility];  Service: Endoscopy;  Laterality: N/A;  8:15am   FISTULA SUPERFICIALIZATION Left 02/14/2018   Procedure: FISTULA SUPERFICIALIZATION LEFT ARM;  Surgeon: Angelia Mould, MD;  Location: [place];  Service: Vascular;  Laterality: Left;   FRACTURE SURGERY Right    ankle   MALONEY DILATION N/A 08/07/2016   Procedure: Venia Minks DILATION;  Surgeon: Daneil Dolin, MD;  [facility];  Service: Endoscopy;  Laterality: N/A;   MASTECTOMY, PARTIAL Right    PORT-A-CATH REMOVAL Left 11/22/2017   Procedure: REMOVAL PORT-A-CATH LEFT CHEST;  Surgeon: Elam Dutch, MD;  Location: [facility] OR;  Service: Vascular;  Laterality: Left;   RETINAL DETACHMENT SURGERY Right      SOCIAL HISTORY:  Social History   Socioeconomic History   Marital status: Single    Spouse name: Not on file   Number of children: Not on file   Years of education: Not on file   Highest education level: Not on file  Occupational History   Not on file  Social Needs   Financial resource strain: Not on file   Food insecurity    Worry: Not on file    Inability: Not on file   Transportation needs    Medical: Not on file    Non-medical: Not on file  Tobacco Use   Smoking status: Never Smoker   Smokeless tobacco: Never Used  Substance and Sexual Activity   Alcohol use: No    Alcohol/week: 0.0 standard drinks   Drug use: No   Sexual activity: Not on file  Lifestyle   Physical activity    Days per week: Not on file    Minutes per session: Not on file   Stress: Not on file  Relationships   Social connections    Talks on phone: Not on file    Gets together: Not on file    Attends religious service: Not on file    Active member  of club or organization: Not on file    Attends meetings of clubs or organizations: Not on file    Relationship status: Not on file   Intimate partner violence    Fear of current or ex partner: Not on file    Emotionally abused: Not on file    Physically abused: Not on file    Forced sexual activity: Not on file  Other Topics Concern   Not on file  Social History Narrative   Not on file    FAMILY HISTORY:  Family History  Problem Relation Age of Onset   Multiple myeloma Sister    Brain cancer Sister    Dementia Mother        died at 46   Stroke Mother    Heart failure Mother    Diabetes Mother  Heart disease Father    Prostate cancer Brother    Colon cancer Neg Hx     CURRENT MEDICATIONS:  Outpatient Encounter Medications as of 01/03/2019  Medication Sig   ACCU-CHEK AVIVA PLUS test strip    acyclovir (ZOVIRAX) 200 MG capsule Take 1 capsule (200 mg total) by mouth 2 (two) times daily.   Alcohol Swabs (ALCOHOL PREP) 70 % PADS See admin instructions.   amLODipine (NORVASC) 10 MG tablet Take 10 mg by mouth daily.   anastrozole (ARIMIDEX) 1 MG tablet TAKE 1 TABLET BY MOUTH DAILY   AQUALANCE LANCETS 30G MISC USE TO test twice daily   aspirin EC 81 MG tablet Take 81 mg by mouth daily.   bortezomib IV (VELCADE) 3.5 MG injection Inject into the vein once a week. Weekly every 21 days   calcitRIOL (ROCALTROL) 0.25 MCG capsule Take 0.25 mcg by mouth daily at 12 noon.    calcium-vitamin D (OSCAL 500/200 D-3) 500-200 MG-UNIT tablet Take 1 tablet by mouth daily at 12 noon.    cloNIDine (CATAPRES) 0.3 MG tablet Take 0.3 mg by mouth 2 (two) times daily.    epoetin alfa (EPOGEN,PROCRIT) 4000 UNIT/ML injection Inject 4,000 Units into the vein.   Lancet Devices (ADJUSTABLE LANCING DEVICE) MISC TO check blood glucose   Multiple Vitamin (MULTIVITAMIN) capsule Take 1 capsule by mouth daily.   ONGLYZA 5 MG TABS tablet Take 5 mg by mouth daily.   PARoxetine (PAXIL)  20 MG tablet Take 20 mg by mouth daily.    pioglitazone (ACTOS) 30 MG tablet Take 15 mg by mouth daily.    pravastatin (PRAVACHOL) 20 MG tablet Take 20 mg by mouth every evening.    TOUJEO SOLOSTAR 300 UNIT/ML SOPN INJECT FIVE UNITS INTO THE SKIN DAILY (270 DAY SUPPLY)   traZODone (DESYREL) 100 MG tablet Take 100 mg by mouth at bedtime.   [DISCONTINUED] IFEREX 150 150 MG capsule Take 150 mg by mouth daily.   acetaminophen (TYLENOL) 500 MG tablet Take 500 mg by mouth every 6 (six) hours as needed.    diphenhydrAMINE (BENADRYL) 25 MG tablet Take 25 mg by mouth every 6 (six) hours as needed.   No facility-administered encounter medications on file as of 01/03/2019.     ALLERGIES:  Allergies  Allergen Reactions   Penicillins Other (See Comments)    Unsure of reaction Has patient had a PCN reaction causing immediate rash, facial/tongue/throat swelling, SOB or lightheadedness with hypotension: Unknown Has patient had a PCN reaction causing severe rash involving mucus membranes or skin necrosis: Unknown Has patient had a PCN reaction that required hospitalization: No Has patient had a PCN reaction occurring within the last 10 years: Unknown If all of the above answers are "NO", then may proceed with Cephalosporin use.     Ace Inhibitors Cough and Other (See Comments)    Tongue swell   Lisinopril Other (See Comments)    Tongue swell     PHYSICAL EXAM:  ECOG Performance status: 2  Vitals:   01/03/19 1523  BP: 136/74  Pulse: 75  Resp: 20  Temp: (!) 97.5 F (36.4 C)  SpO2: 97%   Filed Weights   01/03/19 1523  Weight: 169 lb 12.8 oz (77 kg)    Physical Exam Constitutional:      Appearance: Normal appearance. She is obese.  HENT:     Head: Normocephalic.     Nose: Nose normal.     Mouth/Throat:     Mouth: Mucous membranes are moist.  Pharynx: Oropharynx is clear.  Eyes:     Extraocular Movements: Extraocular movements intact.     Conjunctiva/sclera:  Conjunctivae normal.  Neck:     Musculoskeletal: Normal range of motion.  Cardiovascular:     Rate and Rhythm: Normal rate and regular rhythm.     Pulses: Normal pulses.     Heart sounds: Normal heart sounds.  Pulmonary:     Effort: Pulmonary effort is normal.     Breath sounds: Normal breath sounds.  Abdominal:     General: Bowel sounds are normal.     Palpations: Abdomen is soft.  Musculoskeletal:     Right lower leg: Edema present.     Left lower leg: Edema present.  Skin:    General: Skin is warm and dry.  Neurological:     General: No focal deficit present.     Mental Status: She is alert and oriented to person, place, and time.  Psychiatric:        Mood and Affect: Mood normal.        Behavior: Behavior normal.        Thought Content: Thought content normal.        Judgment: Judgment normal.      LABORATORY DATA:  I have reviewed the labs as listed.  CBC    Component Value Date/Time   WBC 8.3 01/03/2019 1444   RBC 3.59 (L) 01/03/2019 1444   HGB 11.4 (L) 01/03/2019 1444   HCT 36.4 01/03/2019 1444   PLT 226 01/03/2019 1444   MCV 101.4 (H) 01/03/2019 1444   MCH 31.8 01/03/2019 1444   MCHC 31.3 01/03/2019 1444   RDW 17.0 (H) 01/03/2019 1444   LYMPHSABS 0.9 01/03/2019 1444   MONOABS 0.6 01/03/2019 1444   EOSABS 0.2 01/03/2019 1444   BASOSABS 0.1 01/03/2019 1444   CMP Latest Ref Rng & Units 01/03/2019 12/27/2018 12/20/2018  Glucose 70 - 99 mg/dL 115(H) 209(H) 153(H)  BUN 8 - 23 mg/dL 39(H) 42(H) 37(H)  Creatinine 0.44 - 1.00 mg/dL 5.17(H) 5.27(H) 5.24(H)  Sodium 135 - 145 mmol/L 138 139 141  Potassium 3.5 - 5.1 mmol/L 4.2 3.9 3.8  Chloride 98 - 111 mmol/L 104 104 104  CO2 22 - 32 mmol/L '23 24 25  ' Calcium 8.9 - 10.3 mg/dL 8.7(L) 8.7(L) 8.8(L)  Total Protein 6.5 - 8.1 g/dL 6.4(L) 6.3(L) 6.6  Total Bilirubin 0.3 - 1.2 mg/dL 0.3 0.5 0.4  Alkaline Phos 38 - 126 U/L 46 43 44  AST 15 - 41 U/L '19 20 19  ' ALT 0 - 44 U/L '11 10 11   ' I have independently reviewed  her scans and discussed with her.     ASSESSMENT & PLAN:   Multiple myeloma ([place]) 1.  IgA lambda plasma cell myeloma, stage II, standard risk: - Labs on 08/29/2018 shows M spike of 0.9 g/dL.  Kappa light chains were 148, lambda light chains 1132, ratio 0.13.  LDH normal.  Beta-2 microglobulin 13.5. -Bone marrow biopsy on 09/20/2018 shows plasma cell myeloma with 30% plasma cells.  Lambda restricted.  FISH panel was normal.  Chromosome analysis was normal. -She had transfusion dependent anemia, last transfusion on 09/13/2018. -Velcade and dexamethasone started on 10/11/2018.  We have changed it to 3 weeks on 1 week off. -We reviewed myeloma labs from 12/27/2018.  SPEP did not show any M spike.  Free lambda light chains have improved to 161 from 270.  Ratio is 0.63. -We will continue the same regimen of Velcade and dexamethasone  3 weeks on 1 week off.  She will be seen back in 6 weeks for follow-up with repeat myeloma panel.  2.  Normocytic anemia: -Combination anemia from CKD, plasma cell myeloma and IDA. -Intermittent Feraheme infusions, last one on 10/25/2018. -She is receiving Retacrit 20,000 units as needed.  3.  Stage I IDC of the right breast, ER/PR positive, HER-2 positive: -She will continue anastrozole.  She has mild right upper extremity lymphedema.  4.  Osteopenia: -DEXA scan on 05/16/2015 shows osteopenia.  She will continue calcium and vitamin D. - We will plan to repeat bone density in March 2021.  Total time spent is 25 minutes with more than 50% of the time spent face-to-face discussing treatment plan, counseling and coordination of care.    Orders placed this encounter:  Orders Placed This Encounter  Procedures   CBC with Differential/Platelet   Comprehensive metabolic panel   Protein electrophoresis, serum   Kappa/lambda light chains   Lactate dehydrogenase      Derek Jack, MD [place] [phone]

## 2019-01-04 MED ORDER — LIDOCAINE HCL (PF) 1 % IJ SOLN
INTRAMUSCULAR | Status: AC
  Filled 2019-01-04: qty 10

## 2019-01-04 MED ORDER — FULVESTRANT 250 MG/5ML IM SOLN
INTRAMUSCULAR | Status: AC
  Filled 2019-01-04: qty 5

## 2019-01-09 ENCOUNTER — Other Ambulatory Visit (HOSPITAL_COMMUNITY): Payer: Self-pay | Admitting: Hematology

## 2019-01-17 ENCOUNTER — Other Ambulatory Visit: Payer: Self-pay

## 2019-01-17 ENCOUNTER — Encounter (HOSPITAL_COMMUNITY): Payer: Self-pay

## 2019-01-17 ENCOUNTER — Inpatient Hospital Stay (HOSPITAL_COMMUNITY): Payer: Medicare Other

## 2019-01-17 ENCOUNTER — Inpatient Hospital Stay (HOSPITAL_COMMUNITY): Payer: Medicare Other | Attending: Hematology

## 2019-01-17 VITALS — BP 129/58 | HR 77 | Temp 98.2°F | Resp 20 | Wt 169.0 lb

## 2019-01-17 DIAGNOSIS — N184 Chronic kidney disease, stage 4 (severe): Secondary | ICD-10-CM

## 2019-01-17 DIAGNOSIS — C9000 Multiple myeloma not having achieved remission: Secondary | ICD-10-CM

## 2019-01-17 DIAGNOSIS — D631 Anemia in chronic kidney disease: Secondary | ICD-10-CM | POA: Diagnosis not present

## 2019-01-17 DIAGNOSIS — Z5111 Encounter for antineoplastic chemotherapy: Secondary | ICD-10-CM | POA: Insufficient documentation

## 2019-01-17 DIAGNOSIS — D509 Iron deficiency anemia, unspecified: Secondary | ICD-10-CM

## 2019-01-17 LAB — COMPREHENSIVE METABOLIC PANEL
ALT: 11 U/L (ref 0–44)
AST: 19 U/L (ref 15–41)
Albumin: 3.5 g/dL (ref 3.5–5.0)
Alkaline Phosphatase: 48 U/L (ref 38–126)
Anion gap: 11 (ref 5–15)
BUN: 42 mg/dL — ABNORMAL HIGH (ref 8–23)
CO2: 24 mmol/L (ref 22–32)
Calcium: 8.6 mg/dL — ABNORMAL LOW (ref 8.9–10.3)
Chloride: 103 mmol/L (ref 98–111)
Creatinine, Ser: 5.67 mg/dL — ABNORMAL HIGH (ref 0.44–1.00)
GFR calc Af Amer: 8 mL/min — ABNORMAL LOW (ref >60)
GFR calc non Af Amer: 7 mL/min — ABNORMAL LOW (ref >60)
Glucose, Bld: 114 mg/dL — ABNORMAL HIGH (ref 70–99)
Potassium: 4.1 mmol/L (ref 3.5–5.1)
Sodium: 138 mmol/L (ref 135–145)
Total Bilirubin: 0.6 mg/dL (ref 0.3–1.2)
Total Protein: 6.3 g/dL — ABNORMAL LOW (ref 6.5–8.1)

## 2019-01-17 LAB — CBC WITH DIFFERENTIAL/PLATELET
Abs Immature Granulocytes: 0.01 10*3/uL (ref 0.00–0.07)
Basophils Absolute: 0 10*3/uL (ref 0.0–0.1)
Basophils Relative: 1 %
Eosinophils Absolute: 0.2 10*3/uL (ref 0.0–0.5)
Eosinophils Relative: 3 %
HCT: 34.5 % — ABNORMAL LOW (ref 36.0–46.0)
Hemoglobin: 10.6 g/dL — ABNORMAL LOW (ref 12.0–15.0)
Immature Granulocytes: 0 %
Lymphocytes Relative: 14 %
Lymphs Abs: 0.8 10*3/uL (ref 0.7–4.0)
MCH: 31.5 pg (ref 26.0–34.0)
MCHC: 30.7 g/dL (ref 30.0–36.0)
MCV: 102.7 fL — ABNORMAL HIGH (ref 80.0–100.0)
Monocytes Absolute: 0.4 10*3/uL (ref 0.1–1.0)
Monocytes Relative: 8 %
Neutro Abs: 4.1 10*3/uL (ref 1.7–7.7)
Neutrophils Relative %: 74 %
Platelets: 222 10*3/uL (ref 150–400)
RBC: 3.36 MIL/uL — ABNORMAL LOW (ref 3.87–5.11)
RDW: 16.2 % — ABNORMAL HIGH (ref 11.5–15.5)
WBC: 5.6 10*3/uL (ref 4.0–10.5)
nRBC: 0 % (ref 0.0–0.2)

## 2019-01-17 MED ORDER — DEXAMETHASONE 4 MG PO TABS
10.0000 mg | ORAL_TABLET | Freq: Once | ORAL | Status: AC
  Administered 2019-01-17: 10 mg via ORAL

## 2019-01-17 MED ORDER — EPOETIN ALFA-EPBX 10000 UNIT/ML IJ SOLN
20000.0000 [IU] | Freq: Once | INTRAMUSCULAR | Status: AC
  Administered 2019-01-17: 20000 [IU] via SUBCUTANEOUS
  Filled 2019-01-17: qty 2

## 2019-01-17 MED ORDER — PROCHLORPERAZINE MALEATE 10 MG PO TABS
10.0000 mg | ORAL_TABLET | Freq: Once | ORAL | Status: AC
  Administered 2019-01-17: 10 mg via ORAL
  Filled 2019-01-17: qty 1

## 2019-01-17 MED ORDER — BORTEZOMIB CHEMO SQ INJECTION 3.5 MG (2.5MG/ML)
1.3000 mg/m2 | Freq: Once | INTRAMUSCULAR | Status: AC
  Administered 2019-01-17: 2.5 mg via SUBCUTANEOUS
  Filled 2019-01-17: qty 1

## 2019-01-17 NOTE — Progress Notes (Signed)
Labs reviewed with Reynolds Bowl NP, Ok to treat today. BUN/Creatinine noted  Treatment given per orders.  Retacrit given per orders.  Patient tolerated it well without problems. Vitals stable and discharged home from clinic ambulatory. Follow up as scheduled.

## 2019-01-17 NOTE — Patient Instructions (Signed)
[facility] Discharge Instructions for Patients Receiving Chemotherapy  Today you received the following chemotherapy agents   To help prevent nausea and vomiting after your treatment, we encourage you to take your nausea medication   If you develop nausea and vomiting that is not controlled by your nausea medication, call the clinic.   BELOW ARE SYMPTOMS THAT SHOULD BE REPORTED IMMEDIATELY:  *FEVER GREATER THAN 100.5 F  *CHILLS WITH OR WITHOUT FEVER  NAUSEA AND VOMITING THAT IS NOT CONTROLLED WITH YOUR NAUSEA MEDICATION  *UNUSUAL SHORTNESS OF BREATH  *UNUSUAL BRUISING OR BLEEDING  TENDERNESS IN MOUTH AND THROAT WITH OR WITHOUT PRESENCE OF ULCERS  *URINARY PROBLEMS  *BOWEL PROBLEMS  UNUSUAL RASH Items with * indicate a potential emergency and should be followed up as soon as possible.  Feel free to call the clinic should you have any questions or concerns. The clinic phone number is [phone].  Please show the CHEMO ALERT CARD at check-in to the Emergency Department and triage nurse.   

## 2019-01-18 MED ORDER — LANREOTIDE ACETATE 120 MG/0.5ML ~~LOC~~ SOLN
SUBCUTANEOUS | Status: AC
  Filled 2019-01-18: qty 120

## 2019-01-18 MED ORDER — PEGFILGRASTIM INJECTION 6 MG/0.6ML ~~LOC~~
PREFILLED_SYRINGE | SUBCUTANEOUS | Status: AC
  Filled 2019-01-18: qty 0.6

## 2019-01-23 ENCOUNTER — Encounter (HOSPITAL_COMMUNITY): Payer: Self-pay | Admitting: *Deleted

## 2019-01-23 NOTE — Progress Notes (Signed)
I received a phone call today from Edell J's sister.  They all 3 live together. All 3 sisters are exhibiting symptoms of the covid-19 virus.  They are going to be tested today.  I informed her of our policy that if they test negative they will still need to be symptom free for 7 days.  If they test positive they will not be able to come for 21 days as long as they are symptom free.  She states that she will call us back with their results.

## 2019-01-24 ENCOUNTER — Other Ambulatory Visit (HOSPITAL_COMMUNITY): Payer: Medicare Other

## 2019-01-24 ENCOUNTER — Other Ambulatory Visit: Payer: Self-pay

## 2019-01-24 ENCOUNTER — Ambulatory Visit (HOSPITAL_COMMUNITY): Payer: Medicare Other

## 2019-01-24 DIAGNOSIS — Z20822 Contact with and (suspected) exposure to covid-19: Secondary | ICD-10-CM

## 2019-01-26 LAB — NOVEL CORONAVIRUS, NAA: SARS-CoV-2, NAA: NOT DETECTED

## 2019-01-31 ENCOUNTER — Inpatient Hospital Stay (HOSPITAL_COMMUNITY): Payer: Medicare Other

## 2019-01-31 ENCOUNTER — Encounter (HOSPITAL_COMMUNITY): Payer: Self-pay

## 2019-01-31 ENCOUNTER — Other Ambulatory Visit: Payer: Self-pay

## 2019-01-31 VITALS — BP 155/71 | HR 65 | Temp 96.8°F | Resp 18

## 2019-01-31 DIAGNOSIS — Z5111 Encounter for antineoplastic chemotherapy: Secondary | ICD-10-CM | POA: Diagnosis not present

## 2019-01-31 DIAGNOSIS — C9000 Multiple myeloma not having achieved remission: Secondary | ICD-10-CM

## 2019-01-31 LAB — COMPREHENSIVE METABOLIC PANEL
ALT: 11 U/L (ref 0–44)
AST: 21 U/L (ref 15–41)
Albumin: 3.6 g/dL (ref 3.5–5.0)
Alkaline Phosphatase: 50 U/L (ref 38–126)
Anion gap: 10 (ref 5–15)
BUN: 43 mg/dL — ABNORMAL HIGH (ref 8–23)
CO2: 23 mmol/L (ref 22–32)
Calcium: 8.8 mg/dL — ABNORMAL LOW (ref 8.9–10.3)
Chloride: 104 mmol/L (ref 98–111)
Creatinine, Ser: 5.54 mg/dL — ABNORMAL HIGH (ref 0.44–1.00)
GFR calc Af Amer: 8 mL/min — ABNORMAL LOW (ref >60)
GFR calc non Af Amer: 7 mL/min — ABNORMAL LOW (ref >60)
Glucose, Bld: 187 mg/dL — ABNORMAL HIGH (ref 70–99)
Potassium: 4 mmol/L (ref 3.5–5.1)
Sodium: 137 mmol/L (ref 135–145)
Total Bilirubin: 0.3 mg/dL (ref 0.3–1.2)
Total Protein: 6.9 g/dL (ref 6.5–8.1)

## 2019-01-31 LAB — CBC WITH DIFFERENTIAL/PLATELET
Abs Immature Granulocytes: 0.02 10*3/uL (ref 0.00–0.07)
Basophils Absolute: 0 10*3/uL (ref 0.0–0.1)
Basophils Relative: 1 %
Eosinophils Absolute: 0.2 10*3/uL (ref 0.0–0.5)
Eosinophils Relative: 3 %
HCT: 36.6 % (ref 36.0–46.0)
Hemoglobin: 11.5 g/dL — ABNORMAL LOW (ref 12.0–15.0)
Immature Granulocytes: 0 %
Lymphocytes Relative: 13 %
Lymphs Abs: 0.8 10*3/uL (ref 0.7–4.0)
MCH: 31.7 pg (ref 26.0–34.0)
MCHC: 31.4 g/dL (ref 30.0–36.0)
MCV: 100.8 fL — ABNORMAL HIGH (ref 80.0–100.0)
Monocytes Absolute: 0.4 10*3/uL (ref 0.1–1.0)
Monocytes Relative: 7 %
Neutro Abs: 4.5 10*3/uL (ref 1.7–7.7)
Neutrophils Relative %: 76 %
Platelets: 228 10*3/uL (ref 150–400)
RBC: 3.63 MIL/uL — ABNORMAL LOW (ref 3.87–5.11)
RDW: 16.4 % — ABNORMAL HIGH (ref 11.5–15.5)
WBC: 5.9 10*3/uL (ref 4.0–10.5)
nRBC: 0 % (ref 0.0–0.2)

## 2019-01-31 MED ORDER — BORTEZOMIB CHEMO SQ INJECTION 3.5 MG (2.5MG/ML)
1.3000 mg/m2 | Freq: Once | INTRAMUSCULAR | Status: AC
  Administered 2019-01-31: 2.5 mg via SUBCUTANEOUS
  Filled 2019-01-31: qty 1

## 2019-01-31 MED ORDER — PROCHLORPERAZINE MALEATE 10 MG PO TABS
10.0000 mg | ORAL_TABLET | Freq: Once | ORAL | Status: AC
  Administered 2019-01-31: 10 mg via ORAL

## 2019-01-31 MED ORDER — DEXAMETHASONE 4 MG PO TABS
ORAL_TABLET | ORAL | Status: AC
  Filled 2019-01-31: qty 3

## 2019-01-31 MED ORDER — DEXAMETHASONE 4 MG PO TABS
10.0000 mg | ORAL_TABLET | Freq: Once | ORAL | Status: AC
  Administered 2019-01-31: 10 mg via ORAL

## 2019-01-31 MED ORDER — PROCHLORPERAZINE MALEATE 10 MG PO TABS
ORAL_TABLET | ORAL | Status: AC
  Filled 2019-01-31: qty 1

## 2019-01-31 NOTE — Progress Notes (Signed)
Labs reviewed with MD, proceed with treatment. 

## 2019-01-31 NOTE — Progress Notes (Signed)
Treatment given per orders. Patient tolerated it well without problems. Vitals stable and discharged home from clinic ambulatory. Follow up as scheduled.  

## 2019-02-01 MED ORDER — PEGFILGRASTIM-CBQV 6 MG/0.6ML ~~LOC~~ SOSY
PREFILLED_SYRINGE | SUBCUTANEOUS | Status: AC
  Filled 2019-02-01: qty 0.6

## 2019-02-01 MED ORDER — PEGFILGRASTIM-JMDB 6 MG/0.6ML ~~LOC~~ SOSY
PREFILLED_SYRINGE | SUBCUTANEOUS | Status: AC
  Filled 2019-02-01: qty 0.6

## 2019-02-01 MED ORDER — DENOSUMAB 120 MG/1.7ML ~~LOC~~ SOLN
SUBCUTANEOUS | Status: AC
  Filled 2019-02-01: qty 1.7

## 2019-02-14 ENCOUNTER — Inpatient Hospital Stay (HOSPITAL_COMMUNITY): Payer: Medicare Other | Admitting: Hematology

## 2019-02-14 ENCOUNTER — Inpatient Hospital Stay (HOSPITAL_COMMUNITY): Payer: Medicare Other | Attending: Hematology

## 2019-02-14 ENCOUNTER — Other Ambulatory Visit: Payer: Self-pay

## 2019-02-14 ENCOUNTER — Inpatient Hospital Stay (HOSPITAL_COMMUNITY): Payer: Medicare Other

## 2019-02-14 ENCOUNTER — Encounter (HOSPITAL_COMMUNITY): Payer: Self-pay | Admitting: Hematology

## 2019-02-14 VITALS — BP 153/64 | HR 71 | Temp 97.7°F | Resp 16 | Wt 168.1 lb

## 2019-02-14 DIAGNOSIS — C9000 Multiple myeloma not having achieved remission: Secondary | ICD-10-CM

## 2019-02-14 DIAGNOSIS — Z79811 Long term (current) use of aromatase inhibitors: Secondary | ICD-10-CM | POA: Insufficient documentation

## 2019-02-14 DIAGNOSIS — D631 Anemia in chronic kidney disease: Secondary | ICD-10-CM | POA: Insufficient documentation

## 2019-02-14 DIAGNOSIS — I129 Hypertensive chronic kidney disease with stage 1 through stage 4 chronic kidney disease, or unspecified chronic kidney disease: Secondary | ICD-10-CM | POA: Insufficient documentation

## 2019-02-14 DIAGNOSIS — E119 Type 2 diabetes mellitus without complications: Secondary | ICD-10-CM | POA: Diagnosis not present

## 2019-02-14 DIAGNOSIS — Z5111 Encounter for antineoplastic chemotherapy: Secondary | ICD-10-CM | POA: Diagnosis not present

## 2019-02-14 DIAGNOSIS — Z170 Estrogen receptor positive status [ER+]: Secondary | ICD-10-CM | POA: Diagnosis not present

## 2019-02-14 DIAGNOSIS — M8580 Other specified disorders of bone density and structure, unspecified site: Secondary | ICD-10-CM | POA: Diagnosis not present

## 2019-02-14 DIAGNOSIS — C50911 Malignant neoplasm of unspecified site of right female breast: Secondary | ICD-10-CM | POA: Insufficient documentation

## 2019-02-14 DIAGNOSIS — Z9011 Acquired absence of right breast and nipple: Secondary | ICD-10-CM | POA: Insufficient documentation

## 2019-02-14 DIAGNOSIS — F0390 Unspecified dementia without behavioral disturbance: Secondary | ICD-10-CM | POA: Diagnosis not present

## 2019-02-14 DIAGNOSIS — K219 Gastro-esophageal reflux disease without esophagitis: Secondary | ICD-10-CM | POA: Diagnosis not present

## 2019-02-14 DIAGNOSIS — S50821A Blister (nonthermal) of right forearm, initial encounter: Secondary | ICD-10-CM | POA: Insufficient documentation

## 2019-02-14 DIAGNOSIS — N184 Chronic kidney disease, stage 4 (severe): Secondary | ICD-10-CM | POA: Insufficient documentation

## 2019-02-14 LAB — CBC WITH DIFFERENTIAL/PLATELET
Abs Immature Granulocytes: 0.02 10*3/uL (ref 0.00–0.07)
Basophils Absolute: 0 10*3/uL (ref 0.0–0.1)
Basophils Relative: 1 %
Eosinophils Absolute: 0.3 10*3/uL (ref 0.0–0.5)
Eosinophils Relative: 4 %
HCT: 36.4 % (ref 36.0–46.0)
Hemoglobin: 11.5 g/dL — ABNORMAL LOW (ref 12.0–15.0)
Immature Granulocytes: 0 %
Lymphocytes Relative: 13 %
Lymphs Abs: 0.8 10*3/uL (ref 0.7–4.0)
MCH: 31.6 pg (ref 26.0–34.0)
MCHC: 31.6 g/dL (ref 30.0–36.0)
MCV: 100 fL (ref 80.0–100.0)
Monocytes Absolute: 0.5 10*3/uL (ref 0.1–1.0)
Monocytes Relative: 8 %
Neutro Abs: 4.8 10*3/uL (ref 1.7–7.7)
Neutrophils Relative %: 74 %
Platelets: 222 10*3/uL (ref 150–400)
RBC: 3.64 MIL/uL — ABNORMAL LOW (ref 3.87–5.11)
RDW: 16.1 % — ABNORMAL HIGH (ref 11.5–15.5)
WBC: 6.5 10*3/uL (ref 4.0–10.5)
nRBC: 0 % (ref 0.0–0.2)

## 2019-02-14 LAB — COMPREHENSIVE METABOLIC PANEL
ALT: 11 U/L (ref 0–44)
AST: 22 U/L (ref 15–41)
Albumin: 3.6 g/dL (ref 3.5–5.0)
Alkaline Phosphatase: 52 U/L (ref 38–126)
Anion gap: 11 (ref 5–15)
BUN: 50 mg/dL — ABNORMAL HIGH (ref 8–23)
CO2: 25 mmol/L (ref 22–32)
Calcium: 9 mg/dL (ref 8.9–10.3)
Chloride: 105 mmol/L (ref 98–111)
Creatinine, Ser: 5.92 mg/dL — ABNORMAL HIGH (ref 0.44–1.00)
GFR calc Af Amer: 7 mL/min — ABNORMAL LOW (ref >60)
GFR calc non Af Amer: 6 mL/min — ABNORMAL LOW (ref >60)
Glucose, Bld: 162 mg/dL — ABNORMAL HIGH (ref 70–99)
Potassium: 4.3 mmol/L (ref 3.5–5.1)
Sodium: 141 mmol/L (ref 135–145)
Total Bilirubin: 0.5 mg/dL (ref 0.3–1.2)
Total Protein: 6.8 g/dL (ref 6.5–8.1)

## 2019-02-14 LAB — LACTATE DEHYDROGENASE: LDH: 181 U/L (ref 98–192)

## 2019-02-14 MED ORDER — DOXYCYCLINE HYCLATE 100 MG PO TABS: 100.0000 mg | ORAL_TABLET | Freq: Two times a day (BID) | ORAL | 0 refills | Status: DC

## 2019-02-14 MED ORDER — DEXAMETHASONE 4 MG PO TABS
ORAL_TABLET | ORAL | Status: AC
  Filled 2019-02-14: qty 3

## 2019-02-14 MED ORDER — DEXAMETHASONE 4 MG PO TABS
10.0000 mg | ORAL_TABLET | Freq: Once | ORAL | Status: AC
  Administered 2019-02-14: 10 mg via ORAL
  Filled 2019-02-14: qty 3

## 2019-02-14 MED ORDER — BORTEZOMIB CHEMO SQ INJECTION 3.5 MG (2.5MG/ML)
1.3000 mg/m2 | Freq: Once | INTRAMUSCULAR | Status: AC
  Administered 2019-02-14: 15:00:00 2.5 mg via SUBCUTANEOUS
  Filled 2019-02-14: qty 1

## 2019-02-14 MED ORDER — PROCHLORPERAZINE MALEATE 10 MG PO TABS
10.0000 mg | ORAL_TABLET | Freq: Once | ORAL | Status: AC
  Administered 2019-02-14: 10 mg via ORAL
  Filled 2019-02-14: qty 1

## 2019-02-14 MED ORDER — PROCHLORPERAZINE MALEATE 10 MG PO TABS
ORAL_TABLET | ORAL | Status: AC
  Filled 2019-02-14: qty 1

## 2019-02-14 NOTE — Progress Notes (Signed)
Patient seen by Dr. Delton Coombes with lab review and ok to treat today.

## 2019-02-14 NOTE — Progress Notes (Signed)
1455 Labs reviewed with and pt seen by Dr. Delton Coombes and pt approved for Velcade injection today per MD                                  Carly Wood tolerated Velcade injection well without complaints or incident. Hgb 11.5 today so Retacrit injection held per parameters. Pt discharged via wheelchair in satisfactory condition accompanied by her sisters.

## 2019-02-14 NOTE — Progress Notes (Signed)
[address] [place], [place] [address]   CLINIC:  Medical Oncology/Hematology  PCP:  Monico Blitz, [place] [address] [phone]   REASON FOR VISIT:  Follow-up for plasma cell myeloma and anemia.  CURRENT THERAPY: Velcade, dexamethasone, Procrit and Feraheme.  BRIEF ONCOLOGIC HISTORY:  Oncology History  Stage 1 infiltrating ductal carcinoma of right female breast ([place]arly Wood)  09/12/2014 Mammogram   Mass in upper outer R breast, middle third depth appears slightly larger than it was on prior exam with more irreg spiculated margins   10/02/2014 Pathology Results   biopsy with invasive ductal carcinoma high grade 1.1 cm, dcis solid type. additional R breast tissue, excision, invasive ductal high grade 0.9 cm    10/24/2014 Pathology Results   no residual invasive carcinoma, DCIS, focal, atypical ductal hyperplasia, 0/7 LN positive for metastatic carcinoma ER > 90%, PR 30%, HER 2 2+   10/24/2014 Cancer Staging   T1cN0M0   10/24/2014 Surgery   R mastectomy, T1c, N0   12/28/2014 - 11/22/2015 Chemotherapy   Taxol/herceptin weekly X 12, Herceptin every 21 days, last due in September 2017   03/11/2015 -  Anti-estrogen oral therapy   Arimidex 1 mg daily   09/12/2015 Imaging   MUGA- The left ventricular ejection fraction equals 68%.   06/08/2016 Treatment Plan Change   Started Nerlynx   Multiple myeloma ([place]arly Wood)  10/04/2018 Initial Diagnosis   Multiple myeloma (Carly Wood)   10/11/2018 -  Chemotherapy   The patient had bortezomib SQ (VELCADE) chemo injection 2.5 mg, 1.3 mg/m2 = 2.5 mg, Subcutaneous,  Once, 6 of 8 cycles Administration: 2.5 mg (10/11/2018), 2.5 mg (10/18/2018), 2.5 mg (10/25/2018), 2.5 mg (11/01/2018), 2.5 mg (11/08/2018), 2.5 mg (11/15/2018), 2.5 mg (11/22/2018), 2.5 mg (11/29/2018), 2.5 mg (12/06/2018), 2.5 mg (12/20/2018), 2.5 mg (12/27/2018), 2.5 mg (01/03/2019), 2.5 mg (01/17/2019), 2.5 mg (01/31/2019), 2.5 mg (02/14/2019)  for chemotherapy treatment.         CANCER STAGING: Cancer Staging Stage 1 infiltrating ductal carcinoma of right female breast [facility]) Staging form: Breast, AJCC 7th Edition - Clinical stage from 08/30/2015: Stage IA (T1c, N0, M0) - Signed by Baird Cancer, PA-C on 08/30/2015    INTERVAL HISTORY:  Carly Wood 83 y.o. female seen for follow-up of multiple myeloma.  Appetite and energy levels are 100%.  She noticed a blister on the right forearm for the past few days.  It is leaking and she is applying a Band-Aid.  Denies any fevers or chills.  Denies any nausea, vomiting, diarrhea or constipation.  Denies any neuropathy symptoms.  REVIEW OF SYSTEMS:  Review of Systems  Psychiatric/Behavioral: Positive for sleep disturbance.  All other systems reviewed and are negative.    PAST MEDICAL/SURGICAL HISTORY:  Past Medical History:  Diagnosis Date   Anemia    Anxiety    Chronic kidney disease    Chronic renal disease, stage 4, severely decreased glomerular filtration rate (GFR) between 15-29 mL/min/1.73 square meter ([place]) 5/69/7948   Complication of anesthesia    delirious after Breast Surgery   Dementia ([place])    mild   Depression    Diabetes mellitus without complication ([place])    type II   Dyspnea    with activity   GERD (gastroesophageal reflux disease)    Glaucoma    Hypertension    Pneumonia    Stage 1 infiltrating ductal carcinoma of right female breast ([place]) 08/21/2015   ER+ PR+ HER 2 neu + (3+) T1cN0  Past Surgical History:  Procedure Laterality Date   AV FISTULA PLACEMENT Left 11/22/2017   Procedure: ARTERIOVENOUS (AV) FISTULA CREATION LEFT ARM;  Surgeon: Elam Dutch, MD;  Location: [facility];  Service: Vascular;  Laterality: Left;   BIOPSY  08/07/2016   Procedure: BIOPSY;  Surgeon: Daneil Dolin, MD;  [facility];  Service: Endoscopy;;  gastric ulcer biopsy   COLONOSCOPY     ESOPHAGOGASTRODUODENOSCOPY N/A 08/07/2016   Procedure: ESOPHAGOGASTRODUODENOSCOPY (EGD);   Surgeon: Daneil Dolin, MD;  [facility];  Service: Endoscopy;  Laterality: N/A;  1215-rescheduled to 6/1 @ 2:30pm per Tretha Sciara   ESOPHAGOGASTRODUODENOSCOPY N/A 11/27/2016   Procedure: ESOPHAGOGASTRODUODENOSCOPY (EGD);  Surgeon: Daneil Dolin, MD;  [facility];  Service: Endoscopy;  Laterality: N/A;  8:15am   FISTULA SUPERFICIALIZATION Left 02/14/2018   Procedure: FISTULA SUPERFICIALIZATION LEFT ARM;  Surgeon: Angelia Mould, MD;  Location: [facility];  Service: Vascular;  Laterality: Left;   FRACTURE SURGERY Right    ankle   MALONEY DILATION N/A 08/07/2016   Procedure: Venia Minks DILATION;  Surgeon: Daneil Dolin, MD;  [facility];  Service: Endoscopy;  Laterality: N/A;   MASTECTOMY, PARTIAL Right    PORT-A-CATH REMOVAL Left 11/22/2017   Procedure: REMOVAL PORT-A-CATH LEFT CHEST;  Surgeon: Elam Dutch, MD;  Location: [facility] OR;  Service: Vascular;  Laterality: Left;   RETINAL DETACHMENT SURGERY Right      SOCIAL HISTORY:  Social History   Socioeconomic History   Marital status: Single    Spouse name: Not on file   Number of children: Not on file   Years of education: Not on file   Highest education level: Not on file  Occupational History   Not on file  Social Needs   Financial resource strain: Not on file   Food insecurity    Worry: Not on file    Inability: Not on file   Transportation needs    Medical: Not on file    Non-medical: Not on file  Tobacco Use   Smoking status: Never Smoker   Smokeless tobacco: Never Used  Substance and Sexual Activity   Alcohol use: No    Alcohol/week: 0.0 standard drinks   Drug use: No   Sexual activity: Not on file  Lifestyle   Physical activity    Days per week: Not on file    Minutes per session: Not on file   Stress: Not on file  Relationships   Social connections    Talks on phone: Not on file    Gets together: Not on file    Attends religious service: Not on file     Active member of club or organization: Not on file    Attends meetings of clubs or organizations: Not on file    Relationship status: Not on file   Intimate partner violence    Fear of current or ex partner: Not on file    Emotionally abused: Not on file    Physically abused: Not on file    Forced sexual activity: Not on file  Other Topics Concern   Not on file  Social History Narrative   Not on file    FAMILY HISTORY:  Family History  Problem Relation Age of Onset   Multiple myeloma Sister    Brain cancer Sister    Dementia Mother        died at 44   Stroke Mother    Heart failure Mother  Diabetes Mother    Heart disease Father    Prostate cancer Brother    Colon cancer Neg Hx     CURRENT MEDICATIONS:  Outpatient Encounter Medications as of 02/14/2019  Medication Sig   ACCU-CHEK AVIVA PLUS test strip    acetaminophen (TYLENOL) 500 MG tablet Take 500 mg by mouth every 6 (six) hours as needed.    acyclovir (ZOVIRAX) 200 MG capsule TAKE 1 CAPSULE BY MOUTH TWICE DAILY   Alcohol Swabs (ALCOHOL PREP) 70 % PADS See admin instructions.   amLODipine (NORVASC) 10 MG tablet Take 10 mg by mouth daily.   anastrozole (ARIMIDEX) 1 MG tablet TAKE 1 TABLET BY MOUTH DAILY   AQUALANCE LANCETS 30G MISC USE TO test twice daily   aspirin EC 81 MG tablet Take 81 mg by mouth daily.   bortezomib IV (VELCADE) 3.5 MG injection Inject into the vein once a week. Weekly every 21 days   calcitRIOL (ROCALTROL) 0.25 MCG capsule Take 0.25 mcg by mouth daily at 12 noon.    calcium-vitamin D (OSCAL 500/200 D-3) 500-200 MG-UNIT tablet Take 1 tablet by mouth daily at 12 noon.    cloNIDine (CATAPRES) 0.3 MG tablet Take 0.3 mg by mouth 2 (two) times daily.    diphenhydrAMINE (BENADRYL) 25 MG tablet Take 25 mg by mouth every 6 (six) hours as needed.   epoetin alfa (EPOGEN,PROCRIT) 4000 UNIT/ML injection Inject 4,000 Units into the vein.   GLOBAL EASE INJECT PEN NEEDLES 31G X 8  MM MISC USE EVERY DAY AS DIRECTED   Lancet Devices (ADJUSTABLE LANCING DEVICE) MISC TO check blood glucose   Multiple Vitamin (MULTIVITAMIN) capsule Take 1 capsule by mouth daily.   ONGLYZA 5 MG TABS tablet Take 5 mg by mouth daily.   PARoxetine (PAXIL) 20 MG tablet Take 20 mg by mouth daily.    pioglitazone (ACTOS) 30 MG tablet Take 15 mg by mouth daily.    pravastatin (PRAVACHOL) 20 MG tablet Take 20 mg by mouth every evening.    TOUJEO SOLOSTAR 300 UNIT/ML SOPN INJECT FIVE UNITS INTO THE SKIN DAILY (270 DAY SUPPLY)   traZODone (DESYREL) 100 MG tablet Take 100 mg by mouth at bedtime.   doxycycline (VIBRA-TABS) 100 MG tablet Take 1 tablet (100 mg total) by mouth 2 (two) times daily.   No facility-administered encounter medications on file as of 02/14/2019.     ALLERGIES:  Allergies  Allergen Reactions   Penicillins Other (See Comments)    Unsure of reaction Has patient had a PCN reaction causing immediate rash, facial/tongue/throat swelling, SOB or lightheadedness with hypotension: Unknown Has patient had a PCN reaction causing severe rash involving mucus membranes or skin necrosis: Unknown Has patient had a PCN reaction that required hospitalization: No Has patient had a PCN reaction occurring within the last 10 years: Unknown If all of the above answers are "NO", then may proceed with Cephalosporin use.     Ace Inhibitors Cough and Other (See Comments)    Tongue swell   Lisinopril Other (See Comments)    Tongue swell     PHYSICAL EXAM:  ECOG Performance status: 2  Vitals:   02/14/19 1400  BP: (!) 153/64  Pulse: 71  Resp: 16  Temp: 97.7 F (36.5 C)  SpO2: 99%   Filed Weights   02/14/19 1400  Weight: 168 lb 1 oz (76.2 kg)    Physical Exam Constitutional:      Appearance: Normal appearance. She is obese.  HENT:     Head: Normocephalic.  Nose: Nose normal.     Mouth/Throat:     Mouth: Mucous membranes are moist.     Pharynx: Oropharynx is clear.   Eyes:     Extraocular Movements: Extraocular movements intact.     Conjunctiva/sclera: Conjunctivae normal.  Neck:     Musculoskeletal: Normal range of motion.  Cardiovascular:     Rate and Rhythm: Normal rate and regular rhythm.     Pulses: Normal pulses.     Heart sounds: Normal heart sounds.  Pulmonary:     Effort: Pulmonary effort is normal.     Breath sounds: Normal breath sounds.  Abdominal:     General: Bowel sounds are normal.     Palpations: Abdomen is soft.  Musculoskeletal:     Right lower leg: Edema present.     Left lower leg: Edema present.  Skin:    General: Skin is warm and dry.  Neurological:     General: No focal deficit present.     Mental Status: She is alert and oriented to person, place, and time.  Psychiatric:        Mood and Affect: Mood normal.        Behavior: Behavior normal.        Thought Content: Thought content normal.        Judgment: Judgment normal.      LABORATORY DATA:  I have reviewed the labs as listed.  CBC    Component Value Date/Time   WBC 6.5 02/14/2019 1327   RBC 3.64 (L) 02/14/2019 1327   HGB 11.5 (L) 02/14/2019 1327   HCT 36.4 02/14/2019 1327   PLT 222 02/14/2019 1327   MCV 100.0 02/14/2019 1327   MCH 31.6 02/14/2019 1327   MCHC 31.6 02/14/2019 1327   RDW 16.1 (H) 02/14/2019 1327   LYMPHSABS 0.8 02/14/2019 1327   MONOABS 0.5 02/14/2019 1327   EOSABS 0.3 02/14/2019 1327   BASOSABS 0.0 02/14/2019 1327   CMP Latest Ref Rng & Units 02/14/2019 01/31/2019 01/17/2019  Glucose 70 - 99 mg/dL 162(H) 187(H) 114(H)  BUN 8 - 23 mg/dL 50(H) 43(H) 42(H)  Creatinine 0.44 - 1.00 mg/dL 5.92(H) 5.54(H) 5.67(H)  Sodium 135 - 145 mmol/L 141 137 138  Potassium 3.5 - 5.1 mmol/L 4.3 4.0 4.1  Chloride 98 - 111 mmol/L 105 104 103  CO2 22 - 32 mmol/L '25 23 24  ' Calcium 8.9 - 10.3 mg/dL 9.0 8.8(L) 8.6(L)  Total Protein 6.5 - 8.1 g/dL 6.8 6.9 6.3(L)  Total Bilirubin 0.3 - 1.2 mg/dL 0.5 0.3 0.6  Alkaline Phos 38 - 126 U/L 52 50 48  AST 15  - 41 U/L '22 21 19  ' ALT 0 - 44 U/L '11 11 11   ' I have independently reviewed her scans and discussed with her.     ASSESSMENT & PLAN:   Multiple myeloma ([place]) 1.  IgA lambda plasma cell myeloma, stage II, standard risk: - Labs on 08/29/2018 shows M spike of 0.9 g/dL.  Kappa light chains were 148, lambda light chains 1132, ratio 0.13.  LDH normal.  Beta-2 microglobulin 13.5. -Bone marrow biopsy on 09/20/2018 shows plasma cell myeloma with 30% plasma cells.  Lambda restricted.  FISH panel was normal.  Chromosome analysis was normal. -She had transfusion dependent anemia, last transfusion on 09/13/2018. -Velcade and dexamethasone started on 10/11/2018.  We have changed it to 3 weeks on 1 week off. -Myeloma labs from 12/27/2018 shows SPEP with no M spike.  Lambda light chains improved to 161.  Ratio  is 0.63. -Myeloma panel from today is pending.  Hemoglobin improved 11.5. -She has a blister on her right forearm.  We will give her doxycycline 100 mg twice daily. -I will see her back in 4 to 5 weeks with repeat myeloma labs.  2.  Normocytic anemia: -Combination anemia from CKD, plasma cell myeloma and IDA. -Intermittent Feraheme infusions, last one on 10/25/2018. -She is receiving Retacrit 20,000 units as needed.  3.  Stage I IDC of the right breast, ER/PR positive, HER-2 positive: -She will continue anastrozole.  She has mild right upper extremity lymphedema.  4.  Osteopenia: -DEXA scan on 05/16/2015 shows osteopenia.  She will continue calcium and vitamin D. - We will plan to repeat bone density in March 2021.     Orders placed this encounter:  Orders Placed This Encounter  Procedures   CBC with Differential/Platelet   Comprehensive metabolic panel   Protein electrophoresis, serum   Kappa/lambda light chains   Lactate dehydrogenase      Derek Jack, MD [place] [phone]

## 2019-02-14 NOTE — Patient Instructions (Addendum)
[place] at [facility] Discharge Instructions  You were seen today by Dr. Delton Coombes. He went over your recent lab results. Your lab results look good. He is sending in a prescription for Doxycycline for her arm, she will need to take this 2 times a day for 5 days. Continue weekly labs. He will see you back in 5 weeks for labs and follow up.   Thank you for choosing [place] at [facility] to provide your oncology and hematology care.  To afford each patient quality time with our provider, please arrive at least 15 minutes before your scheduled appointment time.   If you have a lab appointment with the [facility] please come in thru the  Main Entrance and check in at the main information desk  You need to re-schedule your appointment should you arrive 10 or more minutes late.  We strive to give you quality time with our providers, and arriving late affects you and other patients whose appointments are after yours.  Also, if you no show three or more times for appointments you may be dismissed from the clinic at the providers discretion.     Again, thank you for choosing [facility].  Our hope is that these requests will decrease the amount of time that you wait before being seen by our physicians.       _____________________________________________________________  Should you have questions after your visit to [facility], please contact our office at [phone] between the hours of 8:00 a.m. and 4:30 p.m.  Voicemails left after 4:00 p.m. will not be returned until the following business day.  For prescription refill requests, have your pharmacy contact our office and allow 72 hours.    Cancer Center Support Programs:   > Cancer Support Group  2nd Tuesday of the month 1pm-2pm, Journey Room

## 2019-02-14 NOTE — Assessment & Plan Note (Signed)
1.  IgA lambda plasma cell myeloma, stage II, standard risk: - Labs on 08/29/2018 shows M spike of 0.9 g/dL.  Kappa light chains were 148, lambda light chains 1132, ratio 0.13.  LDH normal.  Beta-2 microglobulin 13.5. -Bone marrow biopsy on 09/20/2018 shows plasma cell myeloma with 30% plasma cells.  Lambda restricted.  FISH panel was normal.  Chromosome analysis was normal. -She had transfusion dependent anemia, last transfusion on 09/13/2018. -Velcade and dexamethasone started on 10/11/2018.  We have changed it to 3 weeks on 1 week off. -Myeloma labs from 12/27/2018 shows SPEP with no M spike.  Lambda light chains improved to 161.  Ratio is 0.63. -Myeloma panel from today is pending.  Hemoglobin improved 11.5. -She has a blister on her right forearm.  We will give her doxycycline 100 mg twice daily. -I will see her back in 4 to 5 weeks with repeat myeloma labs.  2.  Normocytic anemia: -Combination anemia from CKD, plasma cell myeloma and IDA. -Intermittent Feraheme infusions, last one on 10/25/2018. -She is receiving Retacrit 20,000 units as needed.  3.  Stage I IDC of the right breast, ER/PR positive, HER-2 positive: -She will continue anastrozole.  She has mild right upper extremity lymphedema.  4.  Osteopenia: -DEXA scan on 05/16/2015 shows osteopenia.  She will continue calcium and vitamin D. - We will plan to repeat bone density in March 2021. 

## 2019-02-14 NOTE — Patient Instructions (Signed)
[facility] Discharge Instructions for Patients Receiving Chemotherapy   Beginning January 23rd 2017 lab work for the Cancer Center will be done in the  Main lab at [place] on 1st floor. If you have a lab appointment with the Cancer Center please come in thru the  Main Entrance and check in at the main information desk   Today you received the following chemotherapy agents Velcade. Follow-up as scheduled. Call clinic for any questions or concerns  To help prevent nausea and vomiting after your treatment, we encourage you to take your nausea medication   If you develop nausea and vomiting, or diarrhea that is not controlled by your medication, call the clinic.  The clinic phone number is [phone]. Office hours are Monday-Friday 8:30am-5:00pm.  BELOW ARE SYMPTOMS THAT SHOULD BE REPORTED IMMEDIATELY:  *FEVER GREATER THAN 101.0 F  *CHILLS WITH OR WITHOUT FEVER  NAUSEA AND VOMITING THAT IS NOT CONTROLLED WITH YOUR NAUSEA MEDICATION  *UNUSUAL SHORTNESS OF BREATH  *UNUSUAL BRUISING OR BLEEDING  TENDERNESS IN MOUTH AND THROAT WITH OR WITHOUT PRESENCE OF ULCERS  *URINARY PROBLEMS  *BOWEL PROBLEMS  UNUSUAL RASH Items with * indicate a potential emergency and should be followed up as soon as possible. If you have an emergency after office hours please contact your primary care physician or go to the nearest emergency department.  Please call the clinic during office hours if you have any questions or concerns.   You may also contact the Patient Navigator at [phone] should you have any questions or need assistance in obtaining follow up care.      Resources For Cancer Patients and their Caregivers ? American Cancer Society: Can assist with transportation, wigs, general needs, runs Look Good Feel Better.        1-888-227-6333 ? Cancer Care: Provides financial assistance, online support groups, medication/co-pay assistance.  1-800-813-HOPE  (4673) ? [facility] Assists [place] cancer patients and their families through emotional , educational and financial support.  336-427-4357 ? [place] DSS Where to apply for food stamps, Medicaid and utility assistance. 336-342-1394 ? [facility]: Transportation to medical appointments. 336-347-2287 ? Social Security Administration: May apply for disability if have a Stage IV cancer. 336-342-7796 1-800-772-1213 ? [place] Aging, Disability and Transit Services: Assists with nutrition, care and transit needs. 336-349-2343         

## 2019-02-15 LAB — KAPPA/LAMBDA LIGHT CHAINS
Kappa free light chain: 149.6 mg/L — ABNORMAL HIGH (ref 3.3–19.4)
Kappa, lambda light chain ratio: 0.77 (ref 0.26–1.65)
Lambda free light chains: 194.5 mg/L — ABNORMAL HIGH (ref 5.7–26.3)

## 2019-02-15 LAB — PROTEIN ELECTROPHORESIS, SERUM
A/G Ratio: 1.3 (ref 0.7–1.7)
Albumin ELP: 3.4 g/dL (ref 2.9–4.4)
Alpha-1-Globulin: 0.2 g/dL (ref 0.0–0.4)
Alpha-2-Globulin: 0.8 g/dL (ref 0.4–1.0)
Beta Globulin: 0.9 g/dL (ref 0.7–1.3)
Gamma Globulin: 0.6 g/dL (ref 0.4–1.8)
Globulin, Total: 2.6 g/dL (ref 2.2–3.9)
Total Protein ELP: 6 g/dL (ref 6.0–8.5)

## 2019-02-15 MED ORDER — FULVESTRANT 250 MG/5ML IM SOLN
INTRAMUSCULAR | Status: AC
  Filled 2019-02-15: qty 5

## 2019-02-21 ENCOUNTER — Inpatient Hospital Stay (HOSPITAL_COMMUNITY): Payer: Medicare Other

## 2019-02-21 ENCOUNTER — Other Ambulatory Visit: Payer: Self-pay

## 2019-02-21 VITALS — BP 146/68 | HR 74 | Resp 16 | Wt 171.8 lb

## 2019-02-21 DIAGNOSIS — C9000 Multiple myeloma not having achieved remission: Secondary | ICD-10-CM

## 2019-02-21 DIAGNOSIS — N184 Chronic kidney disease, stage 4 (severe): Secondary | ICD-10-CM

## 2019-02-21 DIAGNOSIS — D509 Iron deficiency anemia, unspecified: Secondary | ICD-10-CM

## 2019-02-21 DIAGNOSIS — Z5111 Encounter for antineoplastic chemotherapy: Secondary | ICD-10-CM | POA: Diagnosis not present

## 2019-02-21 LAB — CBC WITH DIFFERENTIAL/PLATELET
Abs Immature Granulocytes: 0.03 10*3/uL (ref 0.00–0.07)
Basophils Absolute: 0.1 10*3/uL (ref 0.0–0.1)
Basophils Relative: 1 %
Eosinophils Absolute: 0.3 10*3/uL (ref 0.0–0.5)
Eosinophils Relative: 4 %
HCT: 34.8 % — ABNORMAL LOW (ref 36.0–46.0)
Hemoglobin: 10.9 g/dL — ABNORMAL LOW (ref 12.0–15.0)
Immature Granulocytes: 0 %
Lymphocytes Relative: 10 %
Lymphs Abs: 0.7 10*3/uL (ref 0.7–4.0)
MCH: 31.1 pg (ref 26.0–34.0)
MCHC: 31.3 g/dL (ref 30.0–36.0)
MCV: 99.4 fL (ref 80.0–100.0)
Monocytes Absolute: 0.5 10*3/uL (ref 0.1–1.0)
Monocytes Relative: 7 %
Neutro Abs: 5.7 10*3/uL (ref 1.7–7.7)
Neutrophils Relative %: 78 %
Platelets: 199 10*3/uL (ref 150–400)
RBC: 3.5 MIL/uL — ABNORMAL LOW (ref 3.87–5.11)
RDW: 16.7 % — ABNORMAL HIGH (ref 11.5–15.5)
WBC: 7.3 10*3/uL (ref 4.0–10.5)
nRBC: 0 % (ref 0.0–0.2)

## 2019-02-21 LAB — COMPREHENSIVE METABOLIC PANEL
ALT: 10 U/L (ref 0–44)
AST: 19 U/L (ref 15–41)
Albumin: 3.5 g/dL (ref 3.5–5.0)
Alkaline Phosphatase: 47 U/L (ref 38–126)
Anion gap: 8 (ref 5–15)
BUN: 51 mg/dL — ABNORMAL HIGH (ref 8–23)
CO2: 24 mmol/L (ref 22–32)
Calcium: 8.6 mg/dL — ABNORMAL LOW (ref 8.9–10.3)
Chloride: 107 mmol/L (ref 98–111)
Creatinine, Ser: 5.85 mg/dL — ABNORMAL HIGH (ref 0.44–1.00)
GFR calc Af Amer: 7 mL/min — ABNORMAL LOW (ref >60)
GFR calc non Af Amer: 6 mL/min — ABNORMAL LOW (ref >60)
Glucose, Bld: 124 mg/dL — ABNORMAL HIGH (ref 70–99)
Potassium: 4.1 mmol/L (ref 3.5–5.1)
Sodium: 139 mmol/L (ref 135–145)
Total Bilirubin: 0.4 mg/dL (ref 0.3–1.2)
Total Protein: 6.6 g/dL (ref 6.5–8.1)

## 2019-02-21 MED ORDER — BORTEZOMIB CHEMO SQ INJECTION 3.5 MG (2.5MG/ML)
1.3000 mg/m2 | Freq: Once | INTRAMUSCULAR | Status: AC
  Administered 2019-02-21: 2.5 mg via SUBCUTANEOUS
  Filled 2019-02-21: qty 1

## 2019-02-21 MED ORDER — DEXAMETHASONE 4 MG PO TABS
10.0000 mg | ORAL_TABLET | Freq: Once | ORAL | Status: AC
  Administered 2019-02-21: 10 mg via ORAL
  Filled 2019-02-21: qty 3

## 2019-02-21 MED ORDER — EPOETIN ALFA-EPBX 10000 UNIT/ML IJ SOLN
20000.0000 [IU] | Freq: Once | INTRAMUSCULAR | Status: AC
  Administered 2019-02-21: 20000 [IU] via SUBCUTANEOUS
  Filled 2019-02-21: qty 2

## 2019-02-21 MED ORDER — PROCHLORPERAZINE MALEATE 10 MG PO TABS
10.0000 mg | ORAL_TABLET | Freq: Once | ORAL | Status: AC
  Administered 2019-02-21: 15:00:00 10 mg via ORAL
  Filled 2019-02-21: qty 1

## 2019-02-21 NOTE — Progress Notes (Signed)
1415 lab work reviewed, including Cr 5.58, and per MD ok to proceed with Velcade injection today.

## 2019-02-21 NOTE — Patient Instructions (Signed)
[facility] Discharge Instructions for Patients Receiving Chemotherapy   Beginning January 23rd 2017 lab work for the Cancer Center will be done in the  Main lab at [place] on 1st floor. If you have a lab appointment with the Cancer Center please come in thru the  Main Entrance and check in at the main information desk   Today you received the following chemotherapy agents Velcade and Retacrit  To help prevent nausea and vomiting after your treatment, we encourage you to take your nausea medication    If you develop nausea and vomiting, or diarrhea that is not controlled by your medication, call the clinic.  The clinic phone number is [phone]. Office hours are Monday-Friday 8:30am-5:00pm.  BELOW ARE SYMPTOMS THAT SHOULD BE REPORTED IMMEDIATELY:  *FEVER GREATER THAN 101.0 F  *CHILLS WITH OR WITHOUT FEVER  NAUSEA AND VOMITING THAT IS NOT CONTROLLED WITH YOUR NAUSEA MEDICATION  *UNUSUAL SHORTNESS OF BREATH  *UNUSUAL BRUISING OR BLEEDING  TENDERNESS IN MOUTH AND THROAT WITH OR WITHOUT PRESENCE OF ULCERS  *URINARY PROBLEMS  *BOWEL PROBLEMS  UNUSUAL RASH Items with * indicate a potential emergency and should be followed up as soon as possible. If you have an emergency after office hours please contact your primary care physician or go to the nearest emergency department.  Please call the clinic during office hours if you have any questions or concerns.   You may also contact the Patient Navigator at [phone] should you have any questions or need assistance in obtaining follow up care.      Resources For Cancer Patients and their Caregivers ? American Cancer Society: Can assist with transportation, wigs, general needs, runs Look Good Feel Better.        1-888-227-6333 ? Cancer Care: Provides financial assistance, online support groups, medication/co-pay assistance.  1-800-813-HOPE (4673) ? [facility] Assists  [place] cancer patients and their families through emotional , educational and financial support.  336-427-4357 ? [place] DSS Where to apply for food stamps, Medicaid and utility assistance. 336-342-1394 ? [facility]: Transportation to medical appointments. 336-347-2287 ? Social Security Administration: May apply for disability if have a Stage IV cancer. 336-342-7796 1-800-772-1213 ? [place] Aging, Disability and Transit Services: Assists with nutrition, care and transit needs. 336-349-2343          

## 2019-02-28 ENCOUNTER — Inpatient Hospital Stay (HOSPITAL_COMMUNITY): Payer: Medicare Other

## 2019-02-28 ENCOUNTER — Encounter (HOSPITAL_COMMUNITY): Payer: Self-pay

## 2019-02-28 ENCOUNTER — Other Ambulatory Visit: Payer: Self-pay

## 2019-02-28 VITALS — BP 151/70 | HR 74 | Temp 96.9°F | Resp 18

## 2019-02-28 DIAGNOSIS — D509 Iron deficiency anemia, unspecified: Secondary | ICD-10-CM

## 2019-02-28 DIAGNOSIS — N184 Chronic kidney disease, stage 4 (severe): Secondary | ICD-10-CM

## 2019-02-28 DIAGNOSIS — Z5111 Encounter for antineoplastic chemotherapy: Secondary | ICD-10-CM | POA: Diagnosis not present

## 2019-02-28 DIAGNOSIS — C9000 Multiple myeloma not having achieved remission: Secondary | ICD-10-CM

## 2019-02-28 LAB — CBC WITH DIFFERENTIAL/PLATELET
Abs Immature Granulocytes: 0.05 10*3/uL (ref 0.00–0.07)
Basophils Absolute: 0.1 10*3/uL (ref 0.0–0.1)
Basophils Relative: 1 %
Eosinophils Absolute: 0.2 10*3/uL (ref 0.0–0.5)
Eosinophils Relative: 3 %
HCT: 33.6 % — ABNORMAL LOW (ref 36.0–46.0)
Hemoglobin: 10.7 g/dL — ABNORMAL LOW (ref 12.0–15.0)
Immature Granulocytes: 1 %
Lymphocytes Relative: 13 %
Lymphs Abs: 0.9 10*3/uL (ref 0.7–4.0)
MCH: 31.7 pg (ref 26.0–34.0)
MCHC: 31.8 g/dL (ref 30.0–36.0)
MCV: 99.4 fL (ref 80.0–100.0)
Monocytes Absolute: 0.5 10*3/uL (ref 0.1–1.0)
Monocytes Relative: 7 %
Neutro Abs: 5.4 10*3/uL (ref 1.7–7.7)
Neutrophils Relative %: 75 %
Platelets: 214 10*3/uL (ref 150–400)
RBC: 3.38 MIL/uL — ABNORMAL LOW (ref 3.87–5.11)
RDW: 17.3 % — ABNORMAL HIGH (ref 11.5–15.5)
WBC: 7.1 10*3/uL (ref 4.0–10.5)
nRBC: 0 % (ref 0.0–0.2)

## 2019-02-28 LAB — COMPREHENSIVE METABOLIC PANEL
ALT: 14 U/L (ref 0–44)
AST: 21 U/L (ref 15–41)
Albumin: 3.5 g/dL (ref 3.5–5.0)
Alkaline Phosphatase: 48 U/L (ref 38–126)
Anion gap: 12 (ref 5–15)
BUN: 49 mg/dL — ABNORMAL HIGH (ref 8–23)
CO2: 24 mmol/L (ref 22–32)
Calcium: 8.9 mg/dL (ref 8.9–10.3)
Chloride: 105 mmol/L (ref 98–111)
Creatinine, Ser: 6.04 mg/dL — ABNORMAL HIGH (ref 0.44–1.00)
GFR calc Af Amer: 7 mL/min — ABNORMAL LOW (ref >60)
GFR calc non Af Amer: 6 mL/min — ABNORMAL LOW (ref >60)
Glucose, Bld: 133 mg/dL — ABNORMAL HIGH (ref 70–99)
Potassium: 4.3 mmol/L (ref 3.5–5.1)
Sodium: 141 mmol/L (ref 135–145)
Total Bilirubin: 0.4 mg/dL (ref 0.3–1.2)
Total Protein: 6.5 g/dL (ref 6.5–8.1)

## 2019-02-28 MED ORDER — EPOETIN ALFA-EPBX 10000 UNIT/ML IJ SOLN
20000.0000 [IU] | Freq: Once | INTRAMUSCULAR | Status: AC
  Administered 2019-02-28: 20000 [IU] via SUBCUTANEOUS
  Filled 2019-02-28: qty 2

## 2019-02-28 MED ORDER — DEXAMETHASONE 4 MG PO TABS
10.0000 mg | ORAL_TABLET | Freq: Once | ORAL | Status: AC
  Administered 2019-02-28: 14:00:00 10 mg via ORAL
  Filled 2019-02-28: qty 3

## 2019-02-28 MED ORDER — PROCHLORPERAZINE MALEATE 10 MG PO TABS
10.0000 mg | ORAL_TABLET | Freq: Once | ORAL | Status: AC
  Administered 2019-02-28: 10 mg via ORAL
  Filled 2019-02-28: qty 1

## 2019-02-28 MED ORDER — BORTEZOMIB CHEMO SQ INJECTION 3.5 MG (2.5MG/ML)
1.3000 mg/m2 | Freq: Once | INTRAMUSCULAR | Status: AC
  Administered 2019-02-28: 2.5 mg via SUBCUTANEOUS
  Filled 2019-02-28: qty 1

## 2019-02-28 NOTE — Progress Notes (Signed)
Clarita reviewed with Dr. Delton Coombes and pt approved for Velcade and Retacrit injection today per MD                                   Wandra Arthurs Suares tolerated Velcade and Retacrit injections well without complaints or incident. Hgb 10.7 today VSS Pt discharged via wheelchair in satisfactory condition accompanied by her sister

## 2019-02-28 NOTE — Patient Instructions (Signed)
[facility] Discharge Instructions for Patients Receiving Chemotherapy   Beginning January 23rd 2017 lab work for the [facility] will be done in the  Main lab at [facility] on 1st floor. If you have a lab appointment with the [facility] please come in thru the  Main Entrance and check in at the main information desk   Today you received the following chemotherapy agents Velcade as well as Retacrit injection. Follow-up as scheduled. Call clinic for any questions or concerns  To help prevent nausea and vomiting after your treatment, we encourage you to take your nausea medication-   If you develop nausea and vomiting, or diarrhea that is not controlled by your medication, call the clinic.  The clinic phone number is [phone]. Office hours are Monday-Friday 8:30am-5:00pm.  BELOW ARE SYMPTOMS THAT SHOULD BE REPORTED IMMEDIATELY:  *FEVER GREATER THAN 101.0 F  *CHILLS WITH OR WITHOUT FEVER  NAUSEA AND VOMITING THAT IS NOT CONTROLLED WITH YOUR NAUSEA MEDICATION  *UNUSUAL SHORTNESS OF BREATH  *UNUSUAL BRUISING OR BLEEDING  TENDERNESS IN MOUTH AND THROAT WITH OR WITHOUT PRESENCE OF ULCERS  *URINARY PROBLEMS  *BOWEL PROBLEMS  UNUSUAL RASH Items with * indicate a potential emergency and should be followed up as soon as possible. If you have an emergency after office hours please contact your primary care physician or go to the nearest emergency department.  Please call the clinic during office hours if you have any questions or concerns.   You may also contact the Patient Navigator at [phone] should you have any questions or need assistance in obtaining follow up care.      Resources For Cancer Patients and their Caregivers ? American Cancer Society: Can assist with transportation, wigs, general needs, runs Look Good Feel Better.        563-597-0836 ? Cancer Care: Provides financial assistance, online support groups, medication/co-pay  assistance.  1-800-813-HOPE 475-003-8148) ? [place] Assists [place] cancer patients and their families through emotional , educational and financial support.  (680)284-1678 ? [place] DSS Where to apply for food stamps, Medicaid and utility assistance. 513-049-1980 ? [facility]: Transportation to medical appointments. 408-535-4426 ? Social Security Administration: May apply for disability if have a Stage IV cancer. (617)883-3055 7141543497 ? [employer], Disability and Transit Services: Assists with nutrition, care and transit needs. 905-098-5595

## 2019-03-01 MED ORDER — OCTREOTIDE ACETATE 30 MG IM KIT
PACK | INTRAMUSCULAR | Status: AC
  Filled 2019-03-01: qty 1

## 2019-03-02 MED ORDER — OCTREOTIDE ACETATE 30 MG IM KIT
PACK | INTRAMUSCULAR | Status: AC
  Filled 2019-03-02: qty 1

## 2019-03-14 ENCOUNTER — Inpatient Hospital Stay (HOSPITAL_COMMUNITY): Payer: Medicare PPO

## 2019-03-14 ENCOUNTER — Inpatient Hospital Stay (HOSPITAL_COMMUNITY): Payer: Medicare PPO | Attending: Hematology

## 2019-03-14 ENCOUNTER — Encounter (HOSPITAL_COMMUNITY): Payer: Self-pay

## 2019-03-14 ENCOUNTER — Other Ambulatory Visit: Payer: Self-pay

## 2019-03-14 VITALS — BP 132/57 | HR 58 | Temp 97.0°F | Resp 18

## 2019-03-14 DIAGNOSIS — Z5111 Encounter for antineoplastic chemotherapy: Secondary | ICD-10-CM | POA: Diagnosis present

## 2019-03-14 DIAGNOSIS — C9000 Multiple myeloma not having achieved remission: Secondary | ICD-10-CM | POA: Diagnosis present

## 2019-03-14 DIAGNOSIS — D631 Anemia in chronic kidney disease: Secondary | ICD-10-CM | POA: Insufficient documentation

## 2019-03-14 DIAGNOSIS — D509 Iron deficiency anemia, unspecified: Secondary | ICD-10-CM

## 2019-03-14 DIAGNOSIS — N184 Chronic kidney disease, stage 4 (severe): Secondary | ICD-10-CM

## 2019-03-14 LAB — CBC WITH DIFFERENTIAL/PLATELET
Abs Immature Granulocytes: 0.02 10*3/uL (ref 0.00–0.07)
Basophils Absolute: 0.1 10*3/uL (ref 0.0–0.1)
Basophils Relative: 1 %
Eosinophils Absolute: 0.3 10*3/uL (ref 0.0–0.5)
Eosinophils Relative: 4 %
HCT: 34.9 % — ABNORMAL LOW (ref 36.0–46.0)
Hemoglobin: 10.9 g/dL — ABNORMAL LOW (ref 12.0–15.0)
Immature Granulocytes: 0 %
Lymphocytes Relative: 11 %
Lymphs Abs: 0.7 10*3/uL (ref 0.7–4.0)
MCH: 31.4 pg (ref 26.0–34.0)
MCHC: 31.2 g/dL (ref 30.0–36.0)
MCV: 100.6 fL — ABNORMAL HIGH (ref 80.0–100.0)
Monocytes Absolute: 0.5 10*3/uL (ref 0.1–1.0)
Monocytes Relative: 8 %
Neutro Abs: 4.7 10*3/uL (ref 1.7–7.7)
Neutrophils Relative %: 76 %
Platelets: 227 10*3/uL (ref 150–400)
RBC: 3.47 MIL/uL — ABNORMAL LOW (ref 3.87–5.11)
RDW: 17.5 % — ABNORMAL HIGH (ref 11.5–15.5)
WBC: 6.2 10*3/uL (ref 4.0–10.5)
nRBC: 0 % (ref 0.0–0.2)

## 2019-03-14 LAB — COMPREHENSIVE METABOLIC PANEL
ALT: 12 U/L (ref 0–44)
AST: 19 U/L (ref 15–41)
Albumin: 3.4 g/dL — ABNORMAL LOW (ref 3.5–5.0)
Alkaline Phosphatase: 43 U/L (ref 38–126)
Anion gap: 8 (ref 5–15)
BUN: 41 mg/dL — ABNORMAL HIGH (ref 8–23)
CO2: 25 mmol/L (ref 22–32)
Calcium: 8.9 mg/dL (ref 8.9–10.3)
Chloride: 105 mmol/L (ref 98–111)
Creatinine, Ser: 6.3 mg/dL — ABNORMAL HIGH (ref 0.44–1.00)
GFR calc Af Amer: 7 mL/min — ABNORMAL LOW (ref >60)
GFR calc non Af Amer: 6 mL/min — ABNORMAL LOW (ref >60)
Glucose, Bld: 108 mg/dL — ABNORMAL HIGH (ref 70–99)
Potassium: 4.2 mmol/L (ref 3.5–5.1)
Sodium: 138 mmol/L (ref 135–145)
Total Bilirubin: 0.5 mg/dL (ref 0.3–1.2)
Total Protein: 6.4 g/dL — ABNORMAL LOW (ref 6.5–8.1)

## 2019-03-14 MED ORDER — BORTEZOMIB CHEMO SQ INJECTION 3.5 MG (2.5MG/ML)
1.3000 mg/m2 | Freq: Once | INTRAMUSCULAR | Status: AC
  Administered 2019-03-14: 2.5 mg via SUBCUTANEOUS
  Filled 2019-03-14: qty 1

## 2019-03-14 MED ORDER — EPOETIN ALFA-EPBX 10000 UNIT/ML IJ SOLN
INTRAMUSCULAR | Status: AC
  Filled 2019-03-14: qty 2

## 2019-03-14 MED ORDER — PROCHLORPERAZINE MALEATE 10 MG PO TABS
10.0000 mg | ORAL_TABLET | Freq: Once | ORAL | Status: AC
  Administered 2019-03-14: 16:00:00 10 mg via ORAL
  Filled 2019-03-14: qty 1

## 2019-03-14 MED ORDER — DEXAMETHASONE 4 MG PO TABS
10.0000 mg | ORAL_TABLET | Freq: Once | ORAL | Status: AC
  Administered 2019-03-14: 16:00:00 10 mg via ORAL
  Filled 2019-03-14: qty 3

## 2019-03-14 MED ORDER — EPOETIN ALFA-EPBX 10000 UNIT/ML IJ SOLN
20000.0000 [IU] | Freq: Once | INTRAMUSCULAR | Status: AC
  Administered 2019-03-14: 16:00:00 20000 [IU] via SUBCUTANEOUS

## 2019-03-14 NOTE — Progress Notes (Signed)
Labs reviewed with Dr. Delton Coombes.  Okay to proceed with Velcade injection per MD.

## 2019-03-15 MED ORDER — OCTREOTIDE ACETATE 30 MG IM KIT
PACK | INTRAMUSCULAR | Status: AC
  Filled 2019-03-15: qty 1

## 2019-03-15 MED ORDER — LANREOTIDE ACETATE 120 MG/0.5ML ~~LOC~~ SOLN
SUBCUTANEOUS | Status: AC
  Filled 2019-03-15: qty 120

## 2019-03-20 ENCOUNTER — Other Ambulatory Visit: Payer: Self-pay

## 2019-03-21 ENCOUNTER — Inpatient Hospital Stay (HOSPITAL_COMMUNITY): Payer: Medicare PPO

## 2019-03-21 ENCOUNTER — Other Ambulatory Visit (HOSPITAL_COMMUNITY): Payer: Self-pay | Admitting: *Deleted

## 2019-03-21 ENCOUNTER — Encounter (HOSPITAL_COMMUNITY): Payer: Self-pay | Admitting: Hematology

## 2019-03-21 ENCOUNTER — Inpatient Hospital Stay (HOSPITAL_COMMUNITY): Payer: Medicare PPO | Admitting: Hematology

## 2019-03-21 DIAGNOSIS — C9000 Multiple myeloma not having achieved remission: Secondary | ICD-10-CM

## 2019-03-21 DIAGNOSIS — Z5111 Encounter for antineoplastic chemotherapy: Secondary | ICD-10-CM | POA: Diagnosis not present

## 2019-03-21 LAB — COMPREHENSIVE METABOLIC PANEL
ALT: 12 U/L (ref 0–44)
AST: 19 U/L (ref 15–41)
Albumin: 3.6 g/dL (ref 3.5–5.0)
Alkaline Phosphatase: 48 U/L (ref 38–126)
Anion gap: 11 (ref 5–15)
BUN: 41 mg/dL — ABNORMAL HIGH (ref 8–23)
CO2: 23 mmol/L (ref 22–32)
Calcium: 8.7 mg/dL — ABNORMAL LOW (ref 8.9–10.3)
Chloride: 104 mmol/L (ref 98–111)
Creatinine, Ser: 6.49 mg/dL — ABNORMAL HIGH (ref 0.44–1.00)
GFR calc Af Amer: 6 mL/min — ABNORMAL LOW (ref >60)
GFR calc non Af Amer: 6 mL/min — ABNORMAL LOW (ref >60)
Glucose, Bld: 224 mg/dL — ABNORMAL HIGH (ref 70–99)
Potassium: 3.8 mmol/L (ref 3.5–5.1)
Sodium: 138 mmol/L (ref 135–145)
Total Bilirubin: 0.5 mg/dL (ref 0.3–1.2)
Total Protein: 6.6 g/dL (ref 6.5–8.1)

## 2019-03-21 LAB — CBC WITH DIFFERENTIAL/PLATELET
Abs Immature Granulocytes: 0.03 10*3/uL (ref 0.00–0.07)
Basophils Absolute: 0 10*3/uL (ref 0.0–0.1)
Basophils Relative: 1 %
Eosinophils Absolute: 0.2 10*3/uL (ref 0.0–0.5)
Eosinophils Relative: 4 %
HCT: 38.1 % (ref 36.0–46.0)
Hemoglobin: 11.6 g/dL — ABNORMAL LOW (ref 12.0–15.0)
Immature Granulocytes: 1 %
Lymphocytes Relative: 11 %
Lymphs Abs: 0.6 10*3/uL — ABNORMAL LOW (ref 0.7–4.0)
MCH: 30.7 pg (ref 26.0–34.0)
MCHC: 30.4 g/dL (ref 30.0–36.0)
MCV: 100.8 fL — ABNORMAL HIGH (ref 80.0–100.0)
Monocytes Absolute: 0.5 10*3/uL (ref 0.1–1.0)
Monocytes Relative: 8 %
Neutro Abs: 4.6 10*3/uL (ref 1.7–7.7)
Neutrophils Relative %: 75 %
Platelets: 215 10*3/uL (ref 150–400)
RBC: 3.78 MIL/uL — ABNORMAL LOW (ref 3.87–5.11)
RDW: 17.6 % — ABNORMAL HIGH (ref 11.5–15.5)
WBC: 6 10*3/uL (ref 4.0–10.5)
nRBC: 0 % (ref 0.0–0.2)

## 2019-03-21 LAB — LACTATE DEHYDROGENASE: LDH: 180 U/L (ref 98–192)

## 2019-03-21 MED ORDER — PROCHLORPERAZINE MALEATE 10 MG PO TABS
10.0000 mg | ORAL_TABLET | Freq: Once | ORAL | Status: AC
  Administered 2019-03-21: 10 mg via ORAL
  Filled 2019-03-21: qty 1

## 2019-03-21 MED ORDER — BORTEZOMIB CHEMO SQ INJECTION 3.5 MG (2.5MG/ML)
1.3000 mg/m2 | Freq: Once | INTRAMUSCULAR | Status: AC
  Administered 2019-03-21: 2.5 mg via SUBCUTANEOUS
  Filled 2019-03-21: qty 1

## 2019-03-21 MED ORDER — DEXAMETHASONE 4 MG PO TABS
10.0000 mg | ORAL_TABLET | Freq: Once | ORAL | Status: AC
  Administered 2019-03-21: 10 mg via ORAL
  Filled 2019-03-21: qty 3

## 2019-03-21 NOTE — Progress Notes (Signed)
Serum Creatinine 6.49 and Hgb 11.6 today. Patient scheduled to see Dr. Delton Coombes for oncology follow up with lab review.    Patient seen by Dr. Delton Coombes with lab review and ok to treat today.   Patient tolerated Velcade injection with no complaints voiced.  Site clean and dry with no bruising or swelling noted at site.  Band aid applied.  Vss with discharge and left ambulatory with no s/s of distress noted.

## 2019-03-21 NOTE — Progress Notes (Signed)
[address] [place], [place] [address]   CLINIC:  Medical Oncology/Hematology  PCP:  Monico Blitz, [place] [address] [phone]   REASON FOR VISIT:  Follow-up for plasma cell myeloma and anemia.  CURRENT THERAPY: Velcade, dexamethasone, Procrit and Feraheme.  BRIEF ONCOLOGIC HISTORY:  Oncology History  Stage 1 infiltrating ductal carcinoma of right female breast ([place])  09/12/2014 Mammogram   Mass in upper outer R breast, middle third depth appears slightly larger than it was on prior exam with more irreg spiculated margins   10/02/2014 Pathology Results   biopsy with invasive ductal carcinoma high grade 1.1 cm, dcis solid type. additional R breast tissue, excision, invasive ductal high grade 0.9 cm    10/24/2014 Pathology Results   no residual invasive carcinoma, DCIS, focal, atypical ductal hyperplasia, 0/7 LN positive for metastatic carcinoma ER > 90%, PR 30%, HER 2 2+   10/24/2014 Cancer Staging   T1cN0M0   10/24/2014 Surgery   R mastectomy, T1c, N0   12/28/2014 - 11/22/2015 Chemotherapy   Taxol/herceptin weekly X 12, Herceptin every 21 days, last due in September 2017   03/11/2015 -  Anti-estrogen oral therapy   Arimidex 1 mg daily   09/12/2015 Imaging   MUGA- The left ventricular ejection fraction equals 68%.   06/08/2016 Treatment Plan Change   Started Nerlynx   Multiple myeloma ([place])  10/04/2018 Initial Diagnosis   Multiple myeloma ([place])   10/11/2018 -  Chemotherapy   The patient had bortezomib SQ (VELCADE) chemo injection 2.5 mg, 1.3 mg/m2 = 2.5 mg, Subcutaneous,  Once, 7 of 9 cycles Administration: 2.5 mg (10/11/2018), 2.5 mg (10/18/2018), 2.5 mg (10/25/2018), 2.5 mg (11/01/2018), 2.5 mg (11/08/2018), 2.5 mg (11/15/2018), 2.5 mg (11/22/2018), 2.5 mg (11/29/2018), 2.5 mg (12/06/2018), 2.5 mg (12/20/2018), 2.5 mg (12/27/2018), 2.5 mg (01/03/2019), 2.5 mg (01/17/2019), 2.5 mg (01/31/2019), 2.5 mg (02/14/2019), 2.5 mg (02/21/2019), 2.5 mg  (02/28/2019), 2.5 mg (03/14/2019), 2.5 mg (03/21/2019), 2.5 mg (03/28/2019)  for chemotherapy treatment.       CANCER STAGING: Cancer Staging Stage 1 infiltrating ductal carcinoma of right female breast [facility]) Staging form: Breast, AJCC 7th Edition - Clinical stage from 08/30/2015: Stage IA (T1c, N0, M0) - Signed by Baird Cancer, PA-C on 08/30/2015    INTERVAL HISTORY:  Ms. Rincon 84 y.o. female seen for follow-up of multiple myeloma and normocytic anemia from chronic kidney disease. Appetite and energy levels are 100%. Occasional confusion has been stable. She is accompanied by her sisters today. Feet swelling is also stable. Denies any tingling or numbness in the extremities. Denies any fevers or infections. No skin rashes reported.  REVIEW OF SYSTEMS:  Review of Systems  Cardiovascular: Positive for leg swelling.  Psychiatric/Behavioral: Positive for sleep disturbance.  All other systems reviewed and are negative.    PAST MEDICAL/SURGICAL HISTORY:  Past Medical History:  Diagnosis Date  . Anemia   . Anxiety   . Chronic kidney disease   . Chronic renal disease, stage 4, severely decreased glomerular filtration rate (GFR) between 15-29 mL/min/1.73 square meter (HCC) 08/22/2015  . Complication of anesthesia    delirious after Breast Surgery  . Dementia ([place])    mild  . Depression   . Diabetes mellitus without complication ([place])    type II  . Dyspnea    with activity  . GERD (gastroesophageal reflux disease)   . Glaucoma   . Hypertension   . Pneumonia   . Stage 1 infiltrating ductal carcinoma  of right female breast ([place]) 08/21/2015   ER+ PR+ HER 2 neu + (3+) T1cN0    Past Surgical History:  Procedure Laterality Date  . AV FISTULA PLACEMENT Left 11/22/2017   Procedure: ARTERIOVENOUS (AV) FISTULA CREATION LEFT ARM;  Surgeon: Elam Dutch, MD;  Location: [facility];  Service: Vascular;  Laterality: Left;  . BIOPSY  08/07/2016   Procedure: BIOPSY;  Surgeon: Daneil Dolin,  MD;  [facility];  Service: Endoscopy;;  gastric ulcer biopsy  . COLONOSCOPY    . ESOPHAGOGASTRODUODENOSCOPY N/A 08/07/2016   Procedure: ESOPHAGOGASTRODUODENOSCOPY (EGD);  Surgeon: Daneil Dolin, MD;  [facility];  Service: Endoscopy;  Laterality: N/A;  1215-rescheduled to 6/1 @ 2:30pm per Tretha Sciara  . ESOPHAGOGASTRODUODENOSCOPY N/A 11/27/2016   Procedure: ESOPHAGOGASTRODUODENOSCOPY (EGD);  Surgeon: Daneil Dolin, MD;  [facility];  Service: Endoscopy;  Laterality: N/A;  8:15am  . FISTULA SUPERFICIALIZATION Left 02/14/2018   Procedure: FISTULA SUPERFICIALIZATION LEFT ARM;  Surgeon: Angelia Mould, MD;  Location: [facility];  Service: Vascular;  Laterality: Left;  . FRACTURE SURGERY Right    ankle  . MALONEY DILATION N/A 08/07/2016   Procedure: Venia Minks DILATION;  Surgeon: Daneil Dolin, MD;  [facility];  Service: Endoscopy;  Laterality: N/A;  . MASTECTOMY, PARTIAL Right   . PORT-A-CATH REMOVAL Left 11/22/2017   Procedure: REMOVAL PORT-A-CATH LEFT CHEST;  Surgeon: Elam Dutch, MD;  Location: [facility] OR;  Service: Vascular;  Laterality: Left;  . RETINAL DETACHMENT SURGERY Right      SOCIAL HISTORY:  Social History   Socioeconomic History  . Marital status: Single    Spouse name: Not on file  . Number of children: Not on file  . Years of education: Not on file  . Highest education level: Not on file  Occupational History  . Not on file  Tobacco Use  . Smoking status: Never Smoker  . Smokeless tobacco: Never Used  Substance and Sexual Activity  . Alcohol use: No    Alcohol/week: 0.0 standard drinks  . Drug use: No  . Sexual activity: Not on file  Other Topics Concern  . Not on file  Social History Narrative  . Not on file   Social Determinants of Health   Financial Resource Strain:   . Difficulty of Paying Living Expenses: Not on file  Food Insecurity:   . Worried About Charity fundraiser in the Last Year: Not on file  .  Ran Out of Food in the Last Year: Not on file  Transportation Needs:   . Lack of Transportation (Medical): Not on file  . Lack of Transportation (Non-Medical): Not on file  Physical Activity:   . Days of Exercise per Week: Not on file  . Minutes of Exercise per Session: Not on file  Stress:   . Feeling of Stress : Not on file  Social Connections:   . Frequency of Communication with Friends and Family: Not on file  . Frequency of Social Gatherings with Friends and Family: Not on file  . Attends Religious Services: Not on file  . Active Member of Clubs or Organizations: Not on file  . Attends Archivist Meetings: Not on file  . Marital Status: Not on file  Intimate Partner Violence:   . Fear of Current or Ex-Partner: Not on file  . Emotionally Abused: Not on file  . Physically Abused: Not on file  . Sexually Abused: Not on file  FAMILY HISTORY:  Family History  Problem Relation Age of Onset  . Multiple myeloma Sister   . Brain cancer Sister   . Dementia Mother        died at 66  . Stroke Mother   . Heart failure Mother   . Diabetes Mother   . Heart disease Father   . Prostate cancer Brother   . Colon cancer Neg Hx     CURRENT MEDICATIONS:  Outpatient Encounter Medications as of 03/21/2019  Medication Sig  . ACCU-CHEK AVIVA PLUS test strip   . acyclovir (ZOVIRAX) 200 MG capsule TAKE 1 CAPSULE BY MOUTH TWICE DAILY  . Alcohol Swabs (ALCOHOL PREP) 70 % PADS See admin instructions.  Marland Kitchen amLODipine (NORVASC) 10 MG tablet Take 10 mg by mouth daily.  Marland Kitchen anastrozole (ARIMIDEX) 1 MG tablet TAKE 1 TABLET BY MOUTH DAILY  . AQUALANCE LANCETS 30G MISC USE TO test twice daily  . aspirin EC 81 MG tablet Take 81 mg by mouth daily.  . bortezomib IV (VELCADE) 3.5 MG injection Inject into the vein once a week. Weekly every 21 days  . calcitRIOL (ROCALTROL) 0.25 MCG capsule Take 0.25 mcg by mouth daily at 12 noon.   . calcium-vitamin D (OSCAL 500/200 D-3) 500-200 MG-UNIT tablet  Take 1 tablet by mouth daily at 12 noon.   . cloNIDine (CATAPRES) 0.3 MG tablet Take 0.3 mg by mouth 2 (two) times daily.   Marland Kitchen epoetin alfa (EPOGEN,PROCRIT) 4000 UNIT/ML injection Inject 4,000 Units into the vein.  Marland Kitchen GLOBAL EASE INJECT PEN NEEDLES 31G X 8 MM MISC USE EVERY DAY AS DIRECTED  . IFEREX 150 150 MG capsule Take 150 mg by mouth daily.  Elmore Guise Devices (ADJUSTABLE LANCING DEVICE) MISC TO check blood glucose  . Multiple Vitamin (MULTIVITAMIN) capsule Take 1 capsule by mouth daily.  . ONGLYZA 5 MG TABS tablet Take 5 mg by mouth daily.  Marland Kitchen PARoxetine (PAXIL) 20 MG tablet Take 20 mg by mouth daily.   . pioglitazone (ACTOS) 30 MG tablet Take 15 mg by mouth daily.   . pravastatin (PRAVACHOL) 20 MG tablet Take 20 mg by mouth every evening.   Marland Kitchen TOUJEO SOLOSTAR 300 UNIT/ML SOPN INJECT FIVE UNITS INTO THE SKIN DAILY (270 DAY SUPPLY)  . traZODone (DESYREL) 100 MG tablet Take 100 mg by mouth at bedtime.  Marland Kitchen acetaminophen (TYLENOL) 500 MG tablet Take 500 mg by mouth every 6 (six) hours as needed.   . diphenhydrAMINE (BENADRYL) 25 MG tablet Take 25 mg by mouth every 6 (six) hours as needed.  . [DISCONTINUED] doxycycline (VIBRA-TABS) 100 MG tablet Take 1 tablet (100 mg total) by mouth 2 (two) times daily.   No facility-administered encounter medications on file as of 03/21/2019.    ALLERGIES:  Allergies  Allergen Reactions  . Penicillins Other (See Comments)    Unsure of reaction Has patient had a PCN reaction causing immediate rash, facial/tongue/throat swelling, SOB or lightheadedness with hypotension: Unknown Has patient had a PCN reaction causing severe rash involving mucus membranes or skin necrosis: Unknown Has patient had a PCN reaction that required hospitalization: No Has patient had a PCN reaction occurring within the last 10 years: Unknown If all of the above answers are "NO", then may proceed with Cephalosporin use.    . Ace Inhibitors Cough and Other (See Comments)    Tongue  swell  . Lisinopril Other (See Comments)    Tongue swell     PHYSICAL EXAM:  ECOG Performance status: 2  Vitals:  03/21/19 1120  BP: (!) 144/76  Pulse: 82  Resp: 18  Temp: 97.8 F (36.6 C)  SpO2: 99%   Filed Weights   03/21/19 1120  Weight: 164 lb 4.8 oz (74.5 kg)    Physical Exam Constitutional:      Appearance: Normal appearance. She is obese.  HENT:     Head: Normocephalic.     Nose: Nose normal.     Mouth/Throat:     Mouth: Mucous membranes are moist.     Pharynx: Oropharynx is clear.  Eyes:     Extraocular Movements: Extraocular movements intact.     Conjunctiva/sclera: Conjunctivae normal.  Cardiovascular:     Rate and Rhythm: Normal rate and regular rhythm.     Pulses: Normal pulses.     Heart sounds: Normal heart sounds.  Pulmonary:     Effort: Pulmonary effort is normal.     Breath sounds: Normal breath sounds.  Abdominal:     General: Bowel sounds are normal.     Palpations: Abdomen is soft.  Musculoskeletal:     Cervical back: Normal range of motion.     Right lower leg: Edema present.     Left lower leg: Edema present.  Skin:    General: Skin is warm and dry.  Neurological:     General: No focal deficit present.     Mental Status: She is alert and oriented to person, place, and time.  Psychiatric:        Mood and Affect: Mood normal.        Behavior: Behavior normal.        Thought Content: Thought content normal.        Judgment: Judgment normal.      LABORATORY DATA:  I have reviewed the labs as listed.  CBC    Component Value Date/Time   WBC 7.4 03/28/2019 1013   RBC 3.73 (L) 03/28/2019 1013   HGB 11.7 (L) 03/28/2019 1013   HCT 37.5 03/28/2019 1013   PLT 211 03/28/2019 1013   MCV 100.5 (H) 03/28/2019 1013   MCH 31.4 03/28/2019 1013   MCHC 31.2 03/28/2019 1013   RDW 17.5 (H) 03/28/2019 1013   LYMPHSABS 0.6 (L) 03/28/2019 1013   MONOABS 0.5 03/28/2019 1013   EOSABS 0.2 03/28/2019 1013   BASOSABS 0.0 03/28/2019 1013   CMP  Latest Ref Rng & Units 03/28/2019 03/21/2019 03/14/2019  Glucose 70 - 99 mg/dL 218(H) 224(H) 108(H)  BUN 8 - 23 mg/dL 45(H) 41(H) 41(H)  Creatinine 0.44 - 1.00 mg/dL 6.82(H) 6.49(H) 6.30(H)  Sodium 135 - 145 mmol/L 138 138 138  Potassium 3.5 - 5.1 mmol/L 4.0 3.8 4.2  Chloride 98 - 111 mmol/L 103 104 105  CO2 22 - 32 mmol/L _0 Calcium 8.9 - 10.3 mg/dL 8.5(L) 8.7(L) 8.9  Total Protein 6.5 - 8.1 g/dL 6.5 6.6 6.4(L)  Total Bilirubin 0.3 - 1.2 mg/dL 0.4 0.5 0.5  Alkaline Phos 38 - 126 U/L 47 48 43  AST 15 - 41 U/L _1 ALT 0 - 44 U/L _2 I have independently reviewed her scans and discussed with her.     ASSESSMENT & PLAN:   Multiple myeloma ([place]) 1.  IgA lambda plasma cell myeloma, stage II, standard risk: - Labs on 08/29/2018 shows M spike of 0.9 g/dL.  Kappa light chains were 148, lambda light chains 1132, ratio 0.13.  LDH normal.  Beta-2 microglobulin 13.5. -Bone marrow biopsy on 09/20/2018 shows  plasma cell myeloma with 30% plasma cells.  Lambda restricted.  FISH panel was normal.  Chromosome analysis was normal. -She had transfusion dependent anemia, last transfusion on 09/13/2018. -Velcade and dexamethasone started on 10/11/2018.  We have changed it to 3 weeks on 1 week off. -We reviewed myeloma labs from 02/14/2019. SPEP did not show any M spike. -Free kappa light chains are 149.6, slightly up from 101. Free lambda light chains are 194, slightly increased from 161. Ratio is normal at 0.77. -Mild elevation of kappa and lambda light chains are likely from worsening renal failure. -We will continue Velcade and dexamethasone at this time. I will reevaluate her in 4 weeks for follow-up.  2.  Normocytic anemia: -Combination anemia from CKD, plasma cell myeloma and relative iron deficiency. -Retacrit 20,000 units started on 11/01/2018. Last Feraheme on 10/25/2018. -Denies any bleeding per rectum or melena. Hemoglobin today is 11.6.  3. Stage I IDC of the right breast, ER/PR  positive, HER-2 negative: -She will continue anastrozole daily. -She has mild right upper extremity lymphedema which is stable.  4. Osteopenia: -Bone density on 05/16/2015 shows osteopenia. We plan to repeat bone density in March 2021. -She will continue calcium and vitamin D supplements.  5. CKD: -Creatinine consistently getting worse despite improvement in myeloma labs. We will make a referral to Dr. Theador Hawthorne.  6. Bone strengthening: -We have not started on zoledronic acid because of volatile renal function. -We'll consider denosumab in the future.     Orders placed this encounter:  No orders of the defined types were placed in this encounter.  Total time spent is 30 minutes with more than 50% of the time spent face-to-face discussing myeloma results, treatment plan, counseling and coordination of care.   Derek Jack, MD [place] [phone]

## 2019-03-21 NOTE — Patient Instructions (Addendum)
[place] at [facility] Discharge Instructions  You were seen today by Dr. Delton Coombes. He went over your recent lab results. He will refer you to a Kidney specialist due to her kidney labs. He will see you back in 4 weeks for labs and follow up.   Thank you for choosing [place] at [facility] to provide your oncology and hematology care.  To afford each patient quality time with our provider, please arrive at least 15 minutes before your scheduled appointment time.   If you have a lab appointment with the [facility] please come in thru the  Main Entrance and check in at the main information desk  You need to re-schedule your appointment should you arrive 10 or more minutes late.  We strive to give you quality time with our providers, and arriving late affects you and other patients whose appointments are after yours.  Also, if you no show three or more times for appointments you may be dismissed from the clinic at the providers discretion.     Again, thank you for choosing [facility].  Our hope is that these requests will decrease the amount of time that you wait before being seen by our physicians.       _____________________________________________________________  Should you have questions after your visit to [facility], please contact our office at [phone] between the hours of 8:00 a.m. and 4:30 p.m.  Voicemails left after 4:00 p.m. will not be returned until the following business day.  For prescription refill requests, have your pharmacy contact our office and allow 72 hours.    Cancer Center Support Programs:   > Cancer Support Group  2nd Tuesday of the month 1pm-2pm, Journey Room

## 2019-03-22 LAB — KAPPA/LAMBDA LIGHT CHAINS
Kappa free light chain: 119.9 mg/L — ABNORMAL HIGH (ref 3.3–19.4)
Kappa, lambda light chain ratio: 0.88 (ref 0.26–1.65)
Lambda free light chains: 136.1 mg/L — ABNORMAL HIGH (ref 5.7–26.3)

## 2019-03-22 LAB — PROTEIN ELECTROPHORESIS, SERUM
A/G Ratio: 1.2 (ref 0.7–1.7)
Albumin ELP: 3.2 g/dL (ref 2.9–4.4)
Alpha-1-Globulin: 0.3 g/dL (ref 0.0–0.4)
Alpha-2-Globulin: 0.8 g/dL (ref 0.4–1.0)
Beta Globulin: 0.9 g/dL (ref 0.7–1.3)
Gamma Globulin: 0.7 g/dL (ref 0.4–1.8)
Globulin, Total: 2.7 g/dL (ref 2.2–3.9)
Total Protein ELP: 5.9 g/dL — ABNORMAL LOW (ref 6.0–8.5)

## 2019-03-28 ENCOUNTER — Inpatient Hospital Stay (HOSPITAL_COMMUNITY): Payer: Medicare PPO

## 2019-03-28 ENCOUNTER — Other Ambulatory Visit: Payer: Self-pay

## 2019-03-28 ENCOUNTER — Encounter (HOSPITAL_COMMUNITY): Payer: Self-pay

## 2019-03-28 VITALS — BP 141/62 | HR 80 | Temp 97.7°F | Resp 18 | Wt 163.5 lb

## 2019-03-28 DIAGNOSIS — Z5111 Encounter for antineoplastic chemotherapy: Secondary | ICD-10-CM | POA: Diagnosis not present

## 2019-03-28 DIAGNOSIS — C9000 Multiple myeloma not having achieved remission: Secondary | ICD-10-CM

## 2019-03-28 LAB — CBC WITH DIFFERENTIAL/PLATELET
Abs Immature Granulocytes: 0.03 10*3/uL (ref 0.00–0.07)
Basophils Absolute: 0 10*3/uL (ref 0.0–0.1)
Basophils Relative: 0 %
Eosinophils Absolute: 0.2 10*3/uL (ref 0.0–0.5)
Eosinophils Relative: 2 %
HCT: 37.5 % (ref 36.0–46.0)
Hemoglobin: 11.7 g/dL — ABNORMAL LOW (ref 12.0–15.0)
Immature Granulocytes: 0 %
Lymphocytes Relative: 8 %
Lymphs Abs: 0.6 10*3/uL — ABNORMAL LOW (ref 0.7–4.0)
MCH: 31.4 pg (ref 26.0–34.0)
MCHC: 31.2 g/dL (ref 30.0–36.0)
MCV: 100.5 fL — ABNORMAL HIGH (ref 80.0–100.0)
Monocytes Absolute: 0.5 10*3/uL (ref 0.1–1.0)
Monocytes Relative: 6 %
Neutro Abs: 6.1 10*3/uL (ref 1.7–7.7)
Neutrophils Relative %: 84 %
Platelets: 211 10*3/uL (ref 150–400)
RBC: 3.73 MIL/uL — ABNORMAL LOW (ref 3.87–5.11)
RDW: 17.5 % — ABNORMAL HIGH (ref 11.5–15.5)
WBC: 7.4 10*3/uL (ref 4.0–10.5)
nRBC: 0 % (ref 0.0–0.2)

## 2019-03-28 LAB — COMPREHENSIVE METABOLIC PANEL
ALT: 11 U/L (ref 0–44)
AST: 20 U/L (ref 15–41)
Albumin: 3.4 g/dL — ABNORMAL LOW (ref 3.5–5.0)
Alkaline Phosphatase: 47 U/L (ref 38–126)
Anion gap: 10 (ref 5–15)
BUN: 45 mg/dL — ABNORMAL HIGH (ref 8–23)
CO2: 25 mmol/L (ref 22–32)
Calcium: 8.5 mg/dL — ABNORMAL LOW (ref 8.9–10.3)
Chloride: 103 mmol/L (ref 98–111)
Creatinine, Ser: 6.82 mg/dL — ABNORMAL HIGH (ref 0.44–1.00)
GFR calc Af Amer: 6 mL/min — ABNORMAL LOW (ref >60)
GFR calc non Af Amer: 5 mL/min — ABNORMAL LOW (ref >60)
Glucose, Bld: 218 mg/dL — ABNORMAL HIGH (ref 70–99)
Potassium: 4 mmol/L (ref 3.5–5.1)
Sodium: 138 mmol/L (ref 135–145)
Total Bilirubin: 0.4 mg/dL (ref 0.3–1.2)
Total Protein: 6.5 g/dL (ref 6.5–8.1)

## 2019-03-28 MED ORDER — PROCHLORPERAZINE MALEATE 10 MG PO TABS
10.0000 mg | ORAL_TABLET | Freq: Once | ORAL | Status: AC
  Administered 2019-03-28: 10 mg via ORAL
  Filled 2019-03-28: qty 1

## 2019-03-28 MED ORDER — DEXAMETHASONE 4 MG PO TABS
10.0000 mg | ORAL_TABLET | Freq: Once | ORAL | Status: AC
  Administered 2019-03-28: 10 mg via ORAL
  Filled 2019-03-28: qty 3

## 2019-03-28 MED ORDER — BORTEZOMIB CHEMO SQ INJECTION 3.5 MG (2.5MG/ML)
1.3000 mg/m2 | Freq: Once | INTRAMUSCULAR | Status: AC
  Administered 2019-03-28: 2.5 mg via SUBCUTANEOUS
  Filled 2019-03-28: qty 1

## 2019-03-28 NOTE — Progress Notes (Signed)
Serum Creatinine 6.82 today and Hgb 11.7 for treatment. Reviewed labs with verbal order ok to treat Dr. Delton Coombes.   Patient tolerated Velcade injection with no complaints voiced.  Site clean and dry with no bruising or swelling noted at site.  Band aid applied.  Vss with discharge and left by wheelchair with no s/s of distress noted.

## 2019-03-29 MED ORDER — OCTREOTIDE ACETATE 30 MG IM KIT
PACK | INTRAMUSCULAR | Status: AC
  Filled 2019-03-29: qty 1

## 2019-04-01 NOTE — Assessment & Plan Note (Signed)
1.  IgA lambda plasma cell myeloma, stage II, standard risk: - Labs on 08/29/2018 shows M spike of 0.9 g/dL.  Kappa light chains were 148, lambda light chains 1132, ratio 0.13.  LDH normal.  Beta-2 microglobulin 13.5. -Bone marrow biopsy on 09/20/2018 shows plasma cell myeloma with 30% plasma cells.  Lambda restricted.  FISH panel was normal.  Chromosome analysis was normal. -She had transfusion dependent anemia, last transfusion on 09/13/2018. -Velcade and dexamethasone started on 10/11/2018.  We have changed it to 3 weeks on 1 week off. -We reviewed myeloma labs from 02/14/2019. SPEP did not show any M spike. -Free kappa light chains are 149.6, slightly up from 101. Free lambda light chains are 194, slightly increased from 161. Ratio is normal at 0.77. -Mild elevation of kappa and lambda light chains are likely from worsening renal failure. -We will continue Velcade and dexamethasone at this time. I will reevaluate her in 4 weeks for follow-up.  2.  Normocytic anemia: -Combination anemia from CKD, plasma cell myeloma and relative iron deficiency. -Retacrit 20,000 units started on 11/01/2018. Last Feraheme on 10/25/2018. -Denies any bleeding per rectum or melena. Hemoglobin today is 11.6.  3. Stage I IDC of the right breast, ER/PR positive, HER-2 negative: -She will continue anastrozole daily. -She has mild right upper extremity lymphedema which is stable.  4. Osteopenia: -Bone density on 05/16/2015 shows osteopenia. We plan to repeat bone density in March 2021. -She will continue calcium and vitamin D supplements.  5. CKD: -Creatinine consistently getting worse despite improvement in myeloma labs. We will make a referral to Dr. Theador Hawthorne.  6. Bone strengthening: -We have not started on zoledronic acid because of volatile renal function. -We'll consider denosumab in the future.

## 2019-04-04 ENCOUNTER — Ambulatory Visit (HOSPITAL_COMMUNITY): Payer: Medicare PPO

## 2019-04-04 ENCOUNTER — Other Ambulatory Visit (HOSPITAL_COMMUNITY): Payer: Medicare PPO

## 2019-04-09 ENCOUNTER — Other Ambulatory Visit (HOSPITAL_COMMUNITY): Payer: Self-pay | Admitting: Nurse Practitioner

## 2019-04-09 ENCOUNTER — Other Ambulatory Visit (HOSPITAL_COMMUNITY): Payer: Self-pay | Admitting: Hematology

## 2019-04-11 ENCOUNTER — Other Ambulatory Visit: Payer: Self-pay

## 2019-04-11 ENCOUNTER — Inpatient Hospital Stay (HOSPITAL_COMMUNITY): Payer: Medicare PPO

## 2019-04-11 ENCOUNTER — Inpatient Hospital Stay (HOSPITAL_COMMUNITY): Payer: Medicare PPO | Attending: Hematology

## 2019-04-11 VITALS — BP 168/70 | HR 79 | Temp 96.9°F | Resp 18

## 2019-04-11 DIAGNOSIS — N184 Chronic kidney disease, stage 4 (severe): Secondary | ICD-10-CM

## 2019-04-11 DIAGNOSIS — D509 Iron deficiency anemia, unspecified: Secondary | ICD-10-CM

## 2019-04-11 DIAGNOSIS — K5909 Other constipation: Secondary | ICD-10-CM | POA: Diagnosis not present

## 2019-04-11 DIAGNOSIS — K219 Gastro-esophageal reflux disease without esophagitis: Secondary | ICD-10-CM | POA: Insufficient documentation

## 2019-04-11 DIAGNOSIS — D631 Anemia in chronic kidney disease: Secondary | ICD-10-CM | POA: Insufficient documentation

## 2019-04-11 DIAGNOSIS — Z7982 Long term (current) use of aspirin: Secondary | ICD-10-CM | POA: Diagnosis not present

## 2019-04-11 DIAGNOSIS — N189 Chronic kidney disease, unspecified: Secondary | ICD-10-CM | POA: Diagnosis not present

## 2019-04-11 DIAGNOSIS — F0390 Unspecified dementia without behavioral disturbance: Secondary | ICD-10-CM | POA: Insufficient documentation

## 2019-04-11 DIAGNOSIS — Z170 Estrogen receptor positive status [ER+]: Secondary | ICD-10-CM | POA: Insufficient documentation

## 2019-04-11 DIAGNOSIS — Z79899 Other long term (current) drug therapy: Secondary | ICD-10-CM | POA: Insufficient documentation

## 2019-04-11 DIAGNOSIS — C50911 Malignant neoplasm of unspecified site of right female breast: Secondary | ICD-10-CM | POA: Insufficient documentation

## 2019-04-11 DIAGNOSIS — Z79811 Long term (current) use of aromatase inhibitors: Secondary | ICD-10-CM | POA: Insufficient documentation

## 2019-04-11 DIAGNOSIS — C9000 Multiple myeloma not having achieved remission: Secondary | ICD-10-CM | POA: Insufficient documentation

## 2019-04-11 DIAGNOSIS — G479 Sleep disorder, unspecified: Secondary | ICD-10-CM | POA: Diagnosis not present

## 2019-04-11 DIAGNOSIS — M7989 Other specified soft tissue disorders: Secondary | ICD-10-CM | POA: Insufficient documentation

## 2019-04-11 DIAGNOSIS — Z794 Long term (current) use of insulin: Secondary | ICD-10-CM | POA: Diagnosis not present

## 2019-04-11 DIAGNOSIS — F419 Anxiety disorder, unspecified: Secondary | ICD-10-CM | POA: Diagnosis not present

## 2019-04-11 DIAGNOSIS — Z5111 Encounter for antineoplastic chemotherapy: Secondary | ICD-10-CM | POA: Insufficient documentation

## 2019-04-11 DIAGNOSIS — M8580 Other specified disorders of bone density and structure, unspecified site: Secondary | ICD-10-CM | POA: Diagnosis not present

## 2019-04-11 DIAGNOSIS — E119 Type 2 diabetes mellitus without complications: Secondary | ICD-10-CM | POA: Diagnosis not present

## 2019-04-11 DIAGNOSIS — I129 Hypertensive chronic kidney disease with stage 1 through stage 4 chronic kidney disease, or unspecified chronic kidney disease: Secondary | ICD-10-CM | POA: Diagnosis not present

## 2019-04-11 DIAGNOSIS — F329 Major depressive disorder, single episode, unspecified: Secondary | ICD-10-CM | POA: Insufficient documentation

## 2019-04-11 LAB — CBC WITH DIFFERENTIAL/PLATELET
Abs Immature Granulocytes: 0.02 10*3/uL (ref 0.00–0.07)
Basophils Absolute: 0 10*3/uL (ref 0.0–0.1)
Basophils Relative: 1 %
Eosinophils Absolute: 0.2 10*3/uL (ref 0.0–0.5)
Eosinophils Relative: 3 %
HCT: 33.3 % — ABNORMAL LOW (ref 36.0–46.0)
Hemoglobin: 10.4 g/dL — ABNORMAL LOW (ref 12.0–15.0)
Immature Granulocytes: 0 %
Lymphocytes Relative: 12 %
Lymphs Abs: 0.7 10*3/uL (ref 0.7–4.0)
MCH: 31.8 pg (ref 26.0–34.0)
MCHC: 31.2 g/dL (ref 30.0–36.0)
MCV: 101.8 fL — ABNORMAL HIGH (ref 80.0–100.0)
Monocytes Absolute: 0.4 10*3/uL (ref 0.1–1.0)
Monocytes Relative: 7 %
Neutro Abs: 4.6 10*3/uL (ref 1.7–7.7)
Neutrophils Relative %: 77 %
Platelets: 244 10*3/uL (ref 150–400)
RBC: 3.27 MIL/uL — ABNORMAL LOW (ref 3.87–5.11)
RDW: 16.1 % — ABNORMAL HIGH (ref 11.5–15.5)
WBC: 6 10*3/uL (ref 4.0–10.5)
nRBC: 0 % (ref 0.0–0.2)

## 2019-04-11 LAB — COMPREHENSIVE METABOLIC PANEL
ALT: 10 U/L (ref 0–44)
AST: 20 U/L (ref 15–41)
Albumin: 3.4 g/dL — ABNORMAL LOW (ref 3.5–5.0)
Alkaline Phosphatase: 43 U/L (ref 38–126)
Anion gap: 9 (ref 5–15)
BUN: 51 mg/dL — ABNORMAL HIGH (ref 8–23)
CO2: 26 mmol/L (ref 22–32)
Calcium: 8.9 mg/dL (ref 8.9–10.3)
Chloride: 104 mmol/L (ref 98–111)
Creatinine, Ser: 6.53 mg/dL — ABNORMAL HIGH (ref 0.44–1.00)
GFR calc Af Amer: 6 mL/min — ABNORMAL LOW (ref >60)
GFR calc non Af Amer: 5 mL/min — ABNORMAL LOW (ref >60)
Glucose, Bld: 210 mg/dL — ABNORMAL HIGH (ref 70–99)
Potassium: 3.8 mmol/L (ref 3.5–5.1)
Sodium: 139 mmol/L (ref 135–145)
Total Bilirubin: 0.5 mg/dL (ref 0.3–1.2)
Total Protein: 6.5 g/dL (ref 6.5–8.1)

## 2019-04-11 LAB — LACTATE DEHYDROGENASE: LDH: 166 U/L (ref 98–192)

## 2019-04-11 MED ORDER — BORTEZOMIB CHEMO SQ INJECTION 3.5 MG (2.5MG/ML)
1.3000 mg/m2 | Freq: Once | INTRAMUSCULAR | Status: AC
  Administered 2019-04-11: 12:00:00 2.5 mg via SUBCUTANEOUS
  Filled 2019-04-11: qty 1

## 2019-04-11 MED ORDER — DEXAMETHASONE 4 MG PO TABS
10.0000 mg | ORAL_TABLET | Freq: Once | ORAL | Status: AC
  Administered 2019-04-11: 12:00:00 10 mg via ORAL
  Filled 2019-04-11: qty 3

## 2019-04-11 MED ORDER — EPOETIN ALFA-EPBX 10000 UNIT/ML IJ SOLN
20000.0000 [IU] | Freq: Once | INTRAMUSCULAR | Status: AC
  Administered 2019-04-11: 12:00:00 20000 [IU] via SUBCUTANEOUS
  Filled 2019-04-11: qty 2

## 2019-04-11 MED ORDER — PROCHLORPERAZINE MALEATE 10 MG PO TABS
10.0000 mg | ORAL_TABLET | Freq: Once | ORAL | Status: AC
  Administered 2019-04-11: 12:00:00 10 mg via ORAL
  Filled 2019-04-11: qty 1

## 2019-04-11 NOTE — Progress Notes (Signed)
1055 Lab results and vitals reviewed with Dr. Delton Coombes. BP 168/70 noted and per MD ok to proceed with Velcade and Retacrit injections today.  Carly Wood tolerated injections without incident or complaint. Discharged in satisfactory condition with follow up instructions.

## 2019-04-11 NOTE — Patient Instructions (Signed)
[facility] Discharge Instructions for Patients Receiving Chemotherapy   Beginning January 23rd 2017 lab work for the Cancer Center will be done in the  Main lab at [facility] on 1st floor. If you have a lab appointment with the Cancer Center please come in thru the  Main Entrance and check in at the main information desk   Today you received the following chemotherapy agents Velcade  To help prevent nausea and vomiting after your treatment, we encourage you to take your nausea medication    If you develop nausea and vomiting, or diarrhea that is not controlled by your medication, call the clinic.  The clinic phone number is [phone]. Office hours are Monday-Friday 8:30am-5:00pm.  BELOW ARE SYMPTOMS THAT SHOULD BE REPORTED IMMEDIATELY:  *FEVER GREATER THAN 101.0 F  *CHILLS WITH OR WITHOUT FEVER  NAUSEA AND VOMITING THAT IS NOT CONTROLLED WITH YOUR NAUSEA MEDICATION  *UNUSUAL SHORTNESS OF BREATH  *UNUSUAL BRUISING OR BLEEDING  TENDERNESS IN MOUTH AND THROAT WITH OR WITHOUT PRESENCE OF ULCERS  *URINARY PROBLEMS  *BOWEL PROBLEMS  UNUSUAL RASH Items with * indicate a potential emergency and should be followed up as soon as possible. If you have an emergency after office hours please contact your primary care physician or go to the nearest emergency department.  Please call the clinic during office hours if you have any questions or concerns.   You may also contact the Patient Navigator at [phone] should you have any questions or need assistance in obtaining follow up care.      Resources For Cancer Patients and their Caregivers ? American Cancer Society: Can assist with transportation, wigs, general needs, runs Look Good Feel Better.        1-888-227-6333 ? Cancer Care: Provides financial assistance, online support groups, medication/co-pay assistance.  1-800-813-HOPE (4673) ? [facility] Assists [place] cancer  patients and their families through emotional , educational and financial support.  336-427-4357 ? [place] DSS Where to apply for food stamps, Medicaid and utility assistance. 336-342-1394 ? [facility]: Transportation to medical appointments. 336-347-2287 ? Social Security Administration: May apply for disability if have a Stage IV cancer. 336-342-7796 1-800-772-1213 ? [place] Aging, Disability and Transit Services: Assists with nutrition, care and transit needs. 336-349-2343          

## 2019-04-12 LAB — PROTEIN ELECTROPHORESIS, SERUM
A/G Ratio: 1.3 (ref 0.7–1.7)
Albumin ELP: 3.4 g/dL (ref 2.9–4.4)
Alpha-1-Globulin: 0.3 g/dL (ref 0.0–0.4)
Alpha-2-Globulin: 0.8 g/dL (ref 0.4–1.0)
Beta Globulin: 0.9 g/dL (ref 0.7–1.3)
Gamma Globulin: 0.7 g/dL (ref 0.4–1.8)
Globulin, Total: 2.7 g/dL (ref 2.2–3.9)
Total Protein ELP: 6.1 g/dL (ref 6.0–8.5)

## 2019-04-12 LAB — KAPPA/LAMBDA LIGHT CHAINS
Kappa free light chain: 118 mg/L — ABNORMAL HIGH (ref 3.3–19.4)
Kappa, lambda light chain ratio: 0.71 (ref 0.26–1.65)
Lambda free light chains: 165.2 mg/L — ABNORMAL HIGH (ref 5.7–26.3)

## 2019-04-18 ENCOUNTER — Other Ambulatory Visit (HOSPITAL_COMMUNITY)
Admission: RE | Admit: 2019-04-18 | Discharge: 2019-04-18 | Disposition: A | Discharge status: Home / Self Care | Payer: Medicare PPO | Source: Ambulatory Visit | Attending: Nephrology | Admitting: Nephrology

## 2019-04-18 ENCOUNTER — Ambulatory Visit (HOSPITAL_COMMUNITY): Payer: Medicare PPO

## 2019-04-18 ENCOUNTER — Other Ambulatory Visit: Payer: Self-pay

## 2019-04-18 ENCOUNTER — Inpatient Hospital Stay (HOSPITAL_COMMUNITY): Payer: Medicare PPO

## 2019-04-18 ENCOUNTER — Inpatient Hospital Stay (HOSPITAL_COMMUNITY): Payer: Medicare PPO | Admitting: Hematology

## 2019-04-18 ENCOUNTER — Encounter (HOSPITAL_COMMUNITY): Payer: Self-pay | Admitting: Hematology

## 2019-04-18 VITALS — BP 158/67 | HR 81 | Temp 97.5°F | Resp 19 | Wt 165.4 lb

## 2019-04-18 DIAGNOSIS — E559 Vitamin D deficiency, unspecified: Secondary | ICD-10-CM | POA: Diagnosis present

## 2019-04-18 DIAGNOSIS — Z79899 Other long term (current) drug therapy: Secondary | ICD-10-CM | POA: Diagnosis present

## 2019-04-18 DIAGNOSIS — D509 Iron deficiency anemia, unspecified: Secondary | ICD-10-CM

## 2019-04-18 DIAGNOSIS — N185 Chronic kidney disease, stage 5: Secondary | ICD-10-CM | POA: Insufficient documentation

## 2019-04-18 DIAGNOSIS — C9000 Multiple myeloma not having achieved remission: Secondary | ICD-10-CM | POA: Diagnosis not present

## 2019-04-18 DIAGNOSIS — Z5111 Encounter for antineoplastic chemotherapy: Secondary | ICD-10-CM | POA: Diagnosis not present

## 2019-04-18 DIAGNOSIS — N184 Chronic kidney disease, stage 4 (severe): Secondary | ICD-10-CM

## 2019-04-18 LAB — COMPREHENSIVE METABOLIC PANEL
ALT: 9 U/L (ref 0–44)
AST: 18 U/L (ref 15–41)
Albumin: 3.5 g/dL (ref 3.5–5.0)
Alkaline Phosphatase: 41 U/L (ref 38–126)
Anion gap: 11 (ref 5–15)
BUN: 54 mg/dL — ABNORMAL HIGH (ref 8–23)
CO2: 25 mmol/L (ref 22–32)
Calcium: 8.9 mg/dL (ref 8.9–10.3)
Chloride: 104 mmol/L (ref 98–111)
Creatinine, Ser: 7.05 mg/dL — ABNORMAL HIGH (ref 0.44–1.00)
GFR calc Af Amer: 6 mL/min — ABNORMAL LOW (ref >60)
GFR calc non Af Amer: 5 mL/min — ABNORMAL LOW (ref >60)
Glucose, Bld: 208 mg/dL — ABNORMAL HIGH (ref 70–99)
Potassium: 4.1 mmol/L (ref 3.5–5.1)
Sodium: 140 mmol/L (ref 135–145)
Total Bilirubin: 0.3 mg/dL (ref 0.3–1.2)
Total Protein: 6.4 g/dL — ABNORMAL LOW (ref 6.5–8.1)

## 2019-04-18 LAB — PROTEIN / CREATININE RATIO, URINE
Creatinine, Urine: 112.33 mg/dL
Protein Creatinine Ratio: 3.33 mg/mg{Cre} — ABNORMAL HIGH (ref 0.00–0.15)
Total Protein, Urine: 374 mg/dL

## 2019-04-18 LAB — RENAL FUNCTION PANEL
Albumin: 3.6 g/dL (ref 3.5–5.0)
Anion gap: 11 (ref 5–15)
BUN: 55 mg/dL — ABNORMAL HIGH (ref 8–23)
CO2: 26 mmol/L (ref 22–32)
Calcium: 9 mg/dL (ref 8.9–10.3)
Chloride: 104 mmol/L (ref 98–111)
Creatinine, Ser: 7.06 mg/dL — ABNORMAL HIGH (ref 0.44–1.00)
GFR calc Af Amer: 6 mL/min — ABNORMAL LOW (ref >60)
GFR calc non Af Amer: 5 mL/min — ABNORMAL LOW (ref >60)
Glucose, Bld: 210 mg/dL — ABNORMAL HIGH (ref 70–99)
Phosphorus: 4.4 mg/dL (ref 2.5–4.6)
Potassium: 4.1 mmol/L (ref 3.5–5.1)
Sodium: 141 mmol/L (ref 135–145)

## 2019-04-18 LAB — CBC WITH DIFFERENTIAL/PLATELET
Abs Immature Granulocytes: 0.04 10*3/uL (ref 0.00–0.07)
Basophils Absolute: 0 10*3/uL (ref 0.0–0.1)
Basophils Relative: 1 %
Eosinophils Absolute: 0.2 10*3/uL (ref 0.0–0.5)
Eosinophils Relative: 3 %
HCT: 31.9 % — ABNORMAL LOW (ref 36.0–46.0)
Hemoglobin: 9.9 g/dL — ABNORMAL LOW (ref 12.0–15.0)
Immature Granulocytes: 1 %
Lymphocytes Relative: 10 %
Lymphs Abs: 0.8 10*3/uL (ref 0.7–4.0)
MCH: 31.9 pg (ref 26.0–34.0)
MCHC: 31 g/dL (ref 30.0–36.0)
MCV: 102.9 fL — ABNORMAL HIGH (ref 80.0–100.0)
Monocytes Absolute: 0.5 10*3/uL (ref 0.1–1.0)
Monocytes Relative: 6 %
Neutro Abs: 6.4 10*3/uL (ref 1.7–7.7)
Neutrophils Relative %: 79 %
Platelets: 230 10*3/uL (ref 150–400)
RBC: 3.1 MIL/uL — ABNORMAL LOW (ref 3.87–5.11)
RDW: 16.8 % — ABNORMAL HIGH (ref 11.5–15.5)
WBC: 7.9 10*3/uL (ref 4.0–10.5)
nRBC: 0 % (ref 0.0–0.2)

## 2019-04-18 LAB — IRON AND TIBC
Iron: 47 ug/dL (ref 28–170)
Saturation Ratios: 26 % (ref 10.4–31.8)
TIBC: 184 ug/dL — ABNORMAL LOW (ref 250–450)
UIBC: 137 ug/dL

## 2019-04-18 LAB — VITAMIN D 25 HYDROXY (VIT D DEFICIENCY, FRACTURES): Vit D, 25-Hydroxy: 40.69 ng/mL (ref 30–100)

## 2019-04-18 LAB — FERRITIN: Ferritin: 72 ng/mL (ref 11–307)

## 2019-04-18 MED ORDER — DEXAMETHASONE 4 MG PO TABS
10.0000 mg | ORAL_TABLET | Freq: Once | ORAL | Status: AC
  Administered 2019-04-18: 12:00:00 10 mg via ORAL
  Filled 2019-04-18: qty 3

## 2019-04-18 MED ORDER — BORTEZOMIB CHEMO SQ INJECTION 3.5 MG (2.5MG/ML)
1.3000 mg/m2 | Freq: Once | INTRAMUSCULAR | Status: AC
  Administered 2019-04-18: 2.5 mg via SUBCUTANEOUS
  Filled 2019-04-18: qty 1

## 2019-04-18 MED ORDER — EPOETIN ALFA-EPBX 10000 UNIT/ML IJ SOLN
20000.0000 [IU] | Freq: Once | INTRAMUSCULAR | Status: AC
  Administered 2019-04-18: 20000 [IU] via SUBCUTANEOUS
  Filled 2019-04-18: qty 2

## 2019-04-18 MED ORDER — PROCHLORPERAZINE MALEATE 10 MG PO TABS
10.0000 mg | ORAL_TABLET | Freq: Once | ORAL | Status: AC
  Administered 2019-04-18: 12:00:00 10 mg via ORAL
  Filled 2019-04-18: qty 1

## 2019-04-18 NOTE — Progress Notes (Signed)
[place] [address], [place] [address]   CLINIC:  Medical Oncology/Hematology  PCP:  Monico Blitz, [place] [address] [phone]   REASON FOR VISIT:  Follow-up for plasma cell myeloma and anemia.  CURRENT THERAPY: Velcade, dexamethasone, Procrit and Feraheme.  BRIEF ONCOLOGIC HISTORY:  Oncology History  Stage 1 infiltrating ductal carcinoma of right female breast ([place])  09/12/2014 Mammogram   Mass in upper outer R breast, middle third depth appears slightly larger than it was on prior exam with more irreg spiculated margins   10/02/2014 Pathology Results   biopsy with invasive ductal carcinoma high grade 1.1 cm, dcis solid type. additional R breast tissue, excision, invasive ductal high grade 0.9 cm    10/24/2014 Pathology Results   no residual invasive carcinoma, DCIS, focal, atypical ductal hyperplasia, 0/7 LN positive for metastatic carcinoma ER > 90%, PR 30%, HER 2 2+   10/24/2014 Cancer Staging   T1cN0M0   10/24/2014 Surgery   R mastectomy, T1c, N0   12/28/2014 - 11/22/2015 Chemotherapy   Taxol/herceptin weekly X 12, Herceptin every 21 days, last due in September 2017   03/11/2015 -  Anti-estrogen oral therapy   Arimidex 1 mg daily   09/12/2015 Imaging   MUGA- The left ventricular ejection fraction equals 68%.   06/08/2016 Treatment Plan Change   Started Nerlynx   Multiple myeloma ([place])  10/04/2018 Initial Diagnosis   Multiple myeloma ([place])   10/11/2018 -  Chemotherapy   The patient had bortezomib SQ (VELCADE) chemo injection 2.5 mg, 1.3 mg/m2 = 2.5 mg, Subcutaneous,  Once, 8 of 9 cycles Administration: 2.5 mg (10/11/2018), 2.5 mg (10/18/2018), 2.5 mg (10/25/2018), 2.5 mg (11/01/2018), 2.5 mg (11/08/2018), 2.5 mg (11/15/2018), 2.5 mg (11/22/2018), 2.5 mg (11/29/2018), 2.5 mg (12/06/2018), 2.5 mg (12/20/2018), 2.5 mg (12/27/2018), 2.5 mg (01/03/2019), 2.5 mg (01/17/2019), 2.5 mg (01/31/2019), 2.5 mg (02/14/2019), 2.5 mg (02/21/2019), 2.5 mg  (02/28/2019), 2.5 mg (03/14/2019), 2.5 mg (03/21/2019), 2.5 mg (03/28/2019), 2.5 mg (04/11/2019), 2.5 mg (04/18/2019)  for chemotherapy treatment.       CANCER STAGING: Cancer Staging Stage 1 infiltrating ductal carcinoma of right female breast [facility]) Staging form: Breast, AJCC 7th Edition - Clinical stage from 08/30/2015: Stage IA (T1c, N0, M0) - Signed by Baird Cancer, PA-C on 08/30/2015    INTERVAL HISTORY:  Ms. Basso 84 y.o. female seen for follow-up of multiple myeloma, normocytic anemia from chronic kidney disease.  She is accompanied by her 2 sisters.  Denies any new onset pains.  Denies any fevers or infections.  Occasional nosebleeds are stable.  Appetite is 100%.  Energy levels are 50%.  Chronic constipation is also stable.  REVIEW OF SYSTEMS:  Review of Systems  HENT:   Positive for nosebleeds.   Cardiovascular: Positive for leg swelling.  Gastrointestinal: Positive for constipation.  Psychiatric/Behavioral: Positive for sleep disturbance.  All other systems reviewed and are negative.    PAST MEDICAL/SURGICAL HISTORY:  Past Medical History:  Diagnosis Date  . Anemia   . Anxiety   . Chronic kidney disease   . Chronic renal disease, stage 4, severely decreased glomerular filtration rate (GFR) between 15-29 mL/min/1.73 square meter (HCC) 08/22/2015  . Complication of anesthesia    delirious after Breast Surgery  . Dementia ([place])    mild  . Depression   . Diabetes mellitus without complication ([place])    type II  . Dyspnea    with activity  . GERD (gastroesophageal reflux disease)   .  Glaucoma   . Hypertension   . Pneumonia   . Stage 1 infiltrating ductal carcinoma of right female breast ([place]) 08/21/2015   ER+ PR+ HER 2 neu + (3+) T1cN0    Past Surgical History:  Procedure Laterality Date  . AV FISTULA PLACEMENT Left 11/22/2017   Procedure: ARTERIOVENOUS (AV) FISTULA CREATION LEFT ARM;  Surgeon: Elam Dutch, MD;  Location: [place];  Service: Vascular;   Laterality: Left;  . BIOPSY  08/07/2016   Procedure: BIOPSY;  Surgeon: Daneil Dolin, MD;  [facility];  Service: Endoscopy;;  gastric ulcer biopsy  . COLONOSCOPY    . ESOPHAGOGASTRODUODENOSCOPY N/A 08/07/2016   Procedure: ESOPHAGOGASTRODUODENOSCOPY (EGD);  Surgeon: Daneil Dolin, MD;  [facility];  Service: Endoscopy;  Laterality: N/A;  1215-rescheduled to 6/1 @ 2:30pm per Tretha Sciara  . ESOPHAGOGASTRODUODENOSCOPY N/A 11/27/2016   Procedure: ESOPHAGOGASTRODUODENOSCOPY (EGD);  Surgeon: Daneil Dolin, MD;  [facility];  Service: Endoscopy;  Laterality: N/A;  8:15am  . FISTULA SUPERFICIALIZATION Left 02/14/2018   Procedure: FISTULA SUPERFICIALIZATION LEFT ARM;  Surgeon: Angelia Mould, MD;  Location: [place] [place];  Service: Vascular;  Laterality: Left;  . FRACTURE SURGERY Right    ankle  . MALONEY DILATION N/A 08/07/2016   Procedure: Venia Minks DILATION;  Surgeon: Daneil Dolin, MD;  [facility];  Service: Endoscopy;  Laterality: N/A;  . MASTECTOMY, PARTIAL Right   . PORT-A-CATH REMOVAL Left 11/22/2017   Procedure: REMOVAL PORT-A-CATH LEFT CHEST;  Surgeon: Elam Dutch, MD;  [facility];  Service: Vascular;  Laterality: Left;  . RETINAL DETACHMENT SURGERY Right      SOCIAL HISTORY:  Social History   Socioeconomic History  . Marital status: Single    Spouse name: Not on file  . Number of children: Not on file  . Years of education: Not on file  . Highest education level: Not on file  Occupational History  . Not on file  Tobacco Use  . Smoking status: Never Smoker  . Smokeless tobacco: Never Used  Substance and Sexual Activity  . Alcohol use: No    Alcohol/week: 0.0 standard drinks  . Drug use: No  . Sexual activity: Not on file  Other Topics Concern  . Not on file  Social History Narrative  . Not on file   Social Determinants of Health   Financial Resource Strain:   . Difficulty of Paying Living Expenses: Not on file   Food Insecurity:   . Worried About Charity fundraiser in the Last Year: Not on file  . Ran Out of Food in the Last Year: Not on file  Transportation Needs:   . Lack of Transportation (Medical): Not on file  . Lack of Transportation (Non-Medical): Not on file  Physical Activity:   . Days of Exercise per Week: Not on file  . Minutes of Exercise per Session: Not on file  Stress:   . Feeling of Stress : Not on file  Social Connections:   . Frequency of Communication with Friends and Family: Not on file  . Frequency of Social Gatherings with Friends and Family: Not on file  . Attends Religious Services: Not on file  . Active Member of Clubs or Organizations: Not on file  . Attends Archivist Meetings: Not on file  . Marital Status: Not on file  Intimate Partner Violence:   . Fear of Current or Ex-Partner: Not on file  . Emotionally Abused: Not on file  .  Physically Abused: Not on file  . Sexually Abused: Not on file    FAMILY HISTORY:  Family History  Problem Relation Age of Onset  . Multiple myeloma Sister   . Brain cancer Sister   . Dementia Mother        died at 82  . Stroke Mother   . Heart failure Mother   . Diabetes Mother   . Heart disease Father   . Prostate cancer Brother   . Colon cancer Neg Hx     CURRENT MEDICATIONS:  Outpatient Encounter Medications as of 04/18/2019  Medication Sig  . ACCU-CHEK AVIVA PLUS test strip   . acyclovir (ZOVIRAX) 200 MG capsule TAKE 1 CAPSULE BY MOUTH TWICE DAILY  . Alcohol Swabs (ALCOHOL PREP) 70 % PADS See admin instructions.  Marland Kitchen amLODipine (NORVASC) 10 MG tablet Take 10 mg by mouth daily.  Marland Kitchen anastrozole (ARIMIDEX) 1 MG tablet TAKE 1 TABLET BY MOUTH DAILY  . AQUALANCE LANCETS 30G MISC USE TO test twice daily  . aspirin EC 81 MG tablet Take 81 mg by mouth daily.  . bortezomib IV (VELCADE) 3.5 MG injection Inject into the vein once a week. Weekly every 21 days  . calcitRIOL (ROCALTROL) 0.25 MCG capsule Take 0.25 mcg by  mouth daily at 12 noon.   . calcium-vitamin D (OSCAL 500/200 D-3) 500-200 MG-UNIT tablet Take 1 tablet by mouth daily at 12 noon.   . cloNIDine (CATAPRES) 0.3 MG tablet Take 0.3 mg by mouth 2 (two) times daily.   Marland Kitchen epoetin alfa (EPOGEN,PROCRIT) 4000 UNIT/ML injection Inject 4,000 Units into the vein.  Marland Kitchen GLOBAL EASE INJECT PEN NEEDLES 31G X 8 MM MISC USE EVERY DAY AS DIRECTED  . IFEREX 150 150 MG capsule Take 150 mg by mouth daily.  Elmore Guise Devices (ADJUSTABLE LANCING DEVICE) MISC TO check blood glucose  . Multiple Vitamin (MULTIVITAMIN) capsule Take 1 capsule by mouth daily.  . ONGLYZA 5 MG TABS tablet Take 5 mg by mouth daily.  Marland Kitchen PARoxetine (PAXIL) 20 MG tablet Take 20 mg by mouth daily.   . pioglitazone (ACTOS) 30 MG tablet Take 15 mg by mouth daily.   . pravastatin (PRAVACHOL) 20 MG tablet Take 20 mg by mouth every evening.   Marland Kitchen TOUJEO SOLOSTAR 300 UNIT/ML SOPN INJECT FIVE UNITS INTO THE SKIN DAILY (270 DAY SUPPLY)  . traZODone (DESYREL) 100 MG tablet Take 100 mg by mouth at bedtime.  Marland Kitchen acetaminophen (TYLENOL) 500 MG tablet Take 500 mg by mouth every 6 (six) hours as needed.   . diphenhydrAMINE (BENADRYL) 25 MG tablet Take 25 mg by mouth every 6 (six) hours as needed.  Marland Kitchen esomeprazole (NEXIUM) 40 MG capsule Take by mouth.   No facility-administered encounter medications on file as of 04/18/2019.    ALLERGIES:  Allergies  Allergen Reactions  . Penicillins Other (See Comments)    Unsure of reaction Has patient had a PCN reaction causing immediate rash, facial/tongue/throat swelling, SOB or lightheadedness with hypotension: Unknown Has patient had a PCN reaction causing severe rash involving mucus membranes or skin necrosis: Unknown Has patient had a PCN reaction that required hospitalization: No Has patient had a PCN reaction occurring within the last 10 years: Unknown If all of the above answers are "NO", then may proceed with Cephalosporin use.    . Ace Inhibitors Cough and Other  (See Comments)    Tongue swell  . Lisinopril Other (See Comments)    Tongue swell     PHYSICAL EXAM:  ECOG  Performance status: 2  Vitals:   04/18/19 1043  BP: (!) 158/67  Pulse: 81  Resp: 19  Temp: (!) 97.5 F (36.4 C)  SpO2: 98%   Filed Weights   04/18/19 1043  Weight: 165 lb 6.4 oz (75 kg)    Physical Exam Constitutional:      Appearance: Normal appearance. She is obese.  HENT:     Head: Normocephalic.     Nose: Nose normal.     Mouth/Throat:     Mouth: Mucous membranes are moist.     Pharynx: Oropharynx is clear.  Eyes:     Extraocular Movements: Extraocular movements intact.     Conjunctiva/sclera: Conjunctivae normal.  Cardiovascular:     Rate and Rhythm: Normal rate and regular rhythm.     Pulses: Normal pulses.     Heart sounds: Normal heart sounds.  Pulmonary:     Effort: Pulmonary effort is normal.     Breath sounds: Normal breath sounds.  Abdominal:     General: Bowel sounds are normal.     Palpations: Abdomen is soft.  Musculoskeletal:     Cervical back: Normal range of motion.     Right lower leg: Edema present.     Left lower leg: Edema present.  Skin:    General: Skin is warm and dry.  Neurological:     General: No focal deficit present.     Mental Status: She is alert and oriented to person, place, and time.  Psychiatric:        Mood and Affect: Mood normal.        Behavior: Behavior normal.        Thought Content: Thought content normal.        Judgment: Judgment normal.      LABORATORY DATA:  I have reviewed the labs as listed.  CBC    Component Value Date/Time   WBC 7.9 04/18/2019 1006   RBC 3.10 (L) 04/18/2019 1006   HGB 9.9 (L) 04/18/2019 1006   HCT 31.9 (L) 04/18/2019 1006   PLT 230 04/18/2019 1006   MCV 102.9 (H) 04/18/2019 1006   MCH 31.9 04/18/2019 1006   MCHC 31.0 04/18/2019 1006   RDW 16.8 (H) 04/18/2019 1006   LYMPHSABS 0.8 04/18/2019 1006   MONOABS 0.5 04/18/2019 1006   EOSABS 0.2 04/18/2019 1006   BASOSABS  0.0 04/18/2019 1006   CMP Latest Ref Rng & Units 04/18/2019 04/18/2019 04/11/2019  Glucose 70 - 99 mg/dL 210(H) 208(H) 210(H)  BUN 8 - 23 mg/dL 55(H) 54(H) 51(H)  Creatinine 0.44 - 1.00 mg/dL 7.06(H) 7.05(H) 6.53(H)  Sodium 135 - 145 mmol/L 141 140 139  Potassium 3.5 - 5.1 mmol/L 4.1 4.1 3.8  Chloride 98 - 111 mmol/L 104 104 104  CO2 22 - 32 mmol/L _0 Calcium 8.9 - 10.3 mg/dL 9.0 8.9 8.9  Total Protein 6.5 - 8.1 g/dL - 6.4(L) 6.5  Total Bilirubin 0.3 - 1.2 mg/dL - 0.3 0.5  Alkaline Phos 38 - 126 U/L - 41 43  AST 15 - 41 U/L - 18 20  ALT 0 - 44 U/L - 9 10   I have independently reviewed her scans and discussed with her.     ASSESSMENT & PLAN:   Multiple myeloma ([place]) 1.  IgA lambda plasma cell myeloma, stage II, standard risk: - Labs on 08/29/2018 shows M spike of 0.9 g/dL.  Kappa light chains were 148, lambda light chains 1132, ratio 0.13.  LDH normal.  Beta-2  microglobulin 13.5. -Bone marrow biopsy on 09/20/2018 shows plasma cell myeloma with 30% plasma cells.  Lambda restricted.  FISH panel was normal.  Chromosome analysis was normal. -She had transfusion dependent anemia, last transfusion on 09/13/2018. -Velcade and dexamethasone started on 10/11/2018.  Regimen is 3 weeks on 1 week off. -We reviewed myeloma panel from 04/11/2019.  M spike is undetectable.  Lambda light chains are 165.  Kappa light chains are 118.  Ratio is 0.71.  Lambda light chains previously were 136 and 194 prior to that. -I have recommended continuation of the same regimen at this time.  If there is worsening, will consider adding cyclophosphamide. -She will have repeat myeloma labs in 3 weeks and come back to see me in 4 weeks.  2.  Normocytic anemia: -She is receiving Retacrit 20,000 units started on 11/01/2018.  Last Feraheme was on 10/25/2018. -We will check her ferritin and iron panel today.  If it is low will consider Feraheme again.  Hemoglobin today is 9.9.  3. Stage I IDC of the right breast, ER/PR  positive, HER-2 negative: -She will continue anastrozole and is tolerating it very well.  Mild right upper extremity lymphedema stable.  4. Osteopenia: -Bone density on 05/16/2015 shows osteopenia. We plan to repeat bone density in March 2021. -She will continue calcium and vitamin D supplements.  5. CKD: -This is consistently getting better despite better control of myeloma. -We will make a referral to Dr. Theador Hawthorne.  6. Bone strengthening: -We have not started bisphosphonates because of volatile renal function.  We may consider Xgeva the future.  7.  Hypertension: -She will continue Norvasc 10 mg daily and clonidine 0.3 mg twice daily.  Systolic blood pressure today is 158.     Orders placed this encounter:  Orders Placed This Encounter  Procedures  . Immunofixation electrophoresis  . CBC with Differential/Platelet  . Comprehensive metabolic panel  . Protein electrophoresis, serum  . Kappa/lambda light chains  . Lactate dehydrogenase  . Immunofixation electrophoresis      Derek Jack, MD [place] [phone]

## 2019-04-18 NOTE — Progress Notes (Signed)
Serum Creatinine 7.05 today and seen by Dr. Delton Coombes for oncology follow up and lab review.  Ok to treat today verbal order Dr. Delton Coombes.   Patient tolerated Velcade and Retacrit  injection with no complaints voiced.  Sites clean and dry with no bruising or swelling noted at site.  Band aids applied.  Vss with discharge and left ambulatory with no s/s of distress noted.

## 2019-04-18 NOTE — Patient Instructions (Addendum)
[place] at [facility] Discharge Instructions  You were seen today by Dr. Delton Coombes. He went over your recent lab results. Your kidney number has gotten worse. Continue treatment as scheduled. He will see you back in 4 weeks for labs and follow up.   Thank you for choosing [place] at [facility] to provide your oncology and hematology care.  To afford each patient quality time with our provider, please arrive at least 15 minutes before your scheduled appointment time.   If you have a lab appointment with the [place] please come in thru the  Main Entrance and check in at the main information desk  You need to re-schedule your appointment should you arrive 10 or more minutes late.  We strive to give you quality time with our providers, and arriving late affects you and other patients whose appointments are after yours.  Also, if you no show three or more times for appointments you may be dismissed from the clinic at the providers discretion.     Again, thank you for choosing [facility].  Our hope is that these requests will decrease the amount of time that you wait before being seen by our physicians.       _____________________________________________________________  Should you have questions after your visit to [facility], please contact our office at [phone] between the hours of 8:00 a.m. and 4:30 p.m.  Voicemails left after 4:00 p.m. will not be returned until the following business day.  For prescription refill requests, have your pharmacy contact our office and allow 72 hours.    Cancer Center Support Programs:   > Cancer Support Group  2nd Tuesday of the month 1pm-2pm, Journey Room

## 2019-04-19 LAB — PARATHYROID HORMONE, INTACT (NO CA): PTH: 38 pg/mL (ref 15–65)

## 2019-04-19 MED ORDER — FULVESTRANT 250 MG/5ML IM SOLN
INTRAMUSCULAR | Status: AC
  Filled 2019-04-19: qty 5

## 2019-04-22 NOTE — Assessment & Plan Note (Addendum)
1.  IgA lambda plasma cell myeloma, stage II, standard risk: - Labs on 08/29/2018 shows M spike of 0.9 g/dL.  Kappa light chains were 148, lambda light chains 1132, ratio 0.13.  LDH normal.  Beta-2 microglobulin 13.5. -Bone marrow biopsy on 09/20/2018 shows plasma cell myeloma with 30% plasma cells.  Lambda restricted.  FISH panel was normal.  Chromosome analysis was normal. -She had transfusion dependent anemia, last transfusion on 09/13/2018. -Velcade and dexamethasone started on 10/11/2018.  Regimen is 3 weeks on 1 week off. -We reviewed myeloma panel from 04/11/2019.  M spike is undetectable.  Lambda light chains are 165.  Kappa light chains are 118.  Ratio is 0.71.  Lambda light chains previously were 136 and 194 prior to that. -I have recommended continuation of the same regimen at this time.  If there is worsening, will consider adding cyclophosphamide. -She will have repeat myeloma labs in 3 weeks and come back to see me in 4 weeks.  2.  Normocytic anemia: -She is receiving Retacrit 20,000 units started on 11/01/2018.  Last Feraheme was on 10/25/2018. -We will check her ferritin and iron panel today.  If it is low will consider Feraheme again.  Hemoglobin today is 9.9.  3. Stage I IDC of the right breast, ER/PR positive, HER-2 negative: -She will continue anastrozole and is tolerating it very well.  Mild right upper extremity lymphedema stable.  4. Osteopenia: -Bone density on 05/16/2015 shows osteopenia. We plan to repeat bone density in March 2021. -She will continue calcium and vitamin D supplements.  5. CKD: -This is consistently getting better despite better control of myeloma. -We will make a referral to Dr. Theador Hawthorne.  6. Bone strengthening: -We have not started bisphosphonates because of volatile renal function.  We may consider Xgeva the future.  7.  Hypertension: -She will continue Norvasc 10 mg daily and clonidine 0.3 mg twice daily.  Systolic blood pressure today is 158.

## 2019-04-23 ENCOUNTER — Ambulatory Visit: Payer: Medicare PPO

## 2019-04-25 ENCOUNTER — Other Ambulatory Visit (HOSPITAL_COMMUNITY)
Admission: RE | Admit: 2019-04-25 | Discharge: 2019-04-25 | Disposition: A | Discharge status: Home / Self Care | Payer: Medicare PPO | Source: Ambulatory Visit | Attending: Nephrology | Admitting: Nephrology

## 2019-04-25 ENCOUNTER — Ambulatory Visit (HOSPITAL_COMMUNITY): Payer: Medicare PPO

## 2019-04-25 ENCOUNTER — Encounter (HOSPITAL_COMMUNITY): Payer: Self-pay

## 2019-04-25 ENCOUNTER — Inpatient Hospital Stay (HOSPITAL_COMMUNITY): Payer: Medicare PPO

## 2019-04-25 ENCOUNTER — Other Ambulatory Visit: Payer: Self-pay

## 2019-04-25 ENCOUNTER — Other Ambulatory Visit (HOSPITAL_COMMUNITY): Payer: Medicare PPO

## 2019-04-25 VITALS — BP 156/75 | HR 81 | Temp 97.1°F | Resp 18

## 2019-04-25 DIAGNOSIS — N185 Chronic kidney disease, stage 5: Secondary | ICD-10-CM | POA: Diagnosis present

## 2019-04-25 DIAGNOSIS — Z5111 Encounter for antineoplastic chemotherapy: Secondary | ICD-10-CM | POA: Diagnosis not present

## 2019-04-25 DIAGNOSIS — C9000 Multiple myeloma not having achieved remission: Secondary | ICD-10-CM

## 2019-04-25 DIAGNOSIS — N184 Chronic kidney disease, stage 4 (severe): Secondary | ICD-10-CM

## 2019-04-25 DIAGNOSIS — D509 Iron deficiency anemia, unspecified: Secondary | ICD-10-CM

## 2019-04-25 DIAGNOSIS — Z79899 Other long term (current) drug therapy: Secondary | ICD-10-CM | POA: Insufficient documentation

## 2019-04-25 LAB — COMPREHENSIVE METABOLIC PANEL
ALT: 10 U/L (ref 0–44)
AST: 18 U/L (ref 15–41)
Albumin: 3.5 g/dL (ref 3.5–5.0)
Alkaline Phosphatase: 47 U/L (ref 38–126)
Anion gap: 11 (ref 5–15)
BUN: 46 mg/dL — ABNORMAL HIGH (ref 8–23)
CO2: 25 mmol/L (ref 22–32)
Calcium: 8.7 mg/dL — ABNORMAL LOW (ref 8.9–10.3)
Chloride: 103 mmol/L (ref 98–111)
Creatinine, Ser: 6.83 mg/dL — ABNORMAL HIGH (ref 0.44–1.00)
GFR calc Af Amer: 6 mL/min — ABNORMAL LOW (ref >60)
GFR calc non Af Amer: 5 mL/min — ABNORMAL LOW (ref >60)
Glucose, Bld: 200 mg/dL — ABNORMAL HIGH (ref 70–99)
Potassium: 4.1 mmol/L (ref 3.5–5.1)
Sodium: 139 mmol/L (ref 135–145)
Total Bilirubin: 0.4 mg/dL (ref 0.3–1.2)
Total Protein: 6.5 g/dL (ref 6.5–8.1)

## 2019-04-25 LAB — CBC WITH DIFFERENTIAL/PLATELET
Abs Immature Granulocytes: 0.04 10*3/uL (ref 0.00–0.07)
Basophils Absolute: 0 10*3/uL (ref 0.0–0.1)
Basophils Relative: 1 %
Eosinophils Absolute: 0.2 10*3/uL (ref 0.0–0.5)
Eosinophils Relative: 3 %
HCT: 33.7 % — ABNORMAL LOW (ref 36.0–46.0)
Hemoglobin: 10.5 g/dL — ABNORMAL LOW (ref 12.0–15.0)
Immature Granulocytes: 1 %
Lymphocytes Relative: 10 %
Lymphs Abs: 0.7 10*3/uL (ref 0.7–4.0)
MCH: 32.2 pg (ref 26.0–34.0)
MCHC: 31.2 g/dL (ref 30.0–36.0)
MCV: 103.4 fL — ABNORMAL HIGH (ref 80.0–100.0)
Monocytes Absolute: 0.6 10*3/uL (ref 0.1–1.0)
Monocytes Relative: 8 %
Neutro Abs: 5.8 10*3/uL (ref 1.7–7.7)
Neutrophils Relative %: 77 %
Platelets: 230 10*3/uL (ref 150–400)
RBC: 3.26 MIL/uL — ABNORMAL LOW (ref 3.87–5.11)
RDW: 17.5 % — ABNORMAL HIGH (ref 11.5–15.5)
WBC: 7.3 10*3/uL (ref 4.0–10.5)
nRBC: 0 % (ref 0.0–0.2)

## 2019-04-25 LAB — HEPATITIS C ANTIBODY: HCV Ab: NONREACTIVE

## 2019-04-25 LAB — HEPATITIS B SURFACE ANTIGEN: Hepatitis B Surface Ag: NONREACTIVE

## 2019-04-25 LAB — HEPATITIS B CORE ANTIBODY, TOTAL: Hep B Core Total Ab: NONREACTIVE

## 2019-04-25 MED ORDER — BORTEZOMIB CHEMO SQ INJECTION 3.5 MG (2.5MG/ML)
1.3000 mg/m2 | Freq: Once | INTRAMUSCULAR | Status: AC
  Administered 2019-04-25: 2.5 mg via SUBCUTANEOUS
  Filled 2019-04-25: qty 1

## 2019-04-25 MED ORDER — DEXAMETHASONE 4 MG PO TABS
10.0000 mg | ORAL_TABLET | Freq: Once | ORAL | Status: AC
  Administered 2019-04-25: 10 mg via ORAL
  Filled 2019-04-25: qty 3

## 2019-04-25 MED ORDER — EPOETIN ALFA-EPBX 10000 UNIT/ML IJ SOLN
20000.0000 [IU] | Freq: Once | INTRAMUSCULAR | Status: AC
  Administered 2019-04-25: 12:00:00 20000 [IU] via SUBCUTANEOUS
  Filled 2019-04-25: qty 2

## 2019-04-25 MED ORDER — LEUPROLIDE ACETATE 7.5 MG IM KIT
PACK | INTRAMUSCULAR | Status: AC
  Filled 2019-04-25: qty 7.5

## 2019-04-25 MED ORDER — PROCHLORPERAZINE MALEATE 10 MG PO TABS
10.0000 mg | ORAL_TABLET | Freq: Once | ORAL | Status: AC
  Administered 2019-04-25: 10 mg via ORAL
  Filled 2019-04-25: qty 1

## 2019-04-25 NOTE — Progress Notes (Signed)
Serum Creatinine 6.83 today.  Dr. Delton Coombes and Francene Finders, NP, notified.  Ok to give velcade verbal order Dr. Delton Coombes.   Patient tolerated Velcade injection with no complaints voiced.  Site clean and dry with no bruising or swelling noted at site.  Band aid applied.  Vss with discharge and left by wheelchair with no s/s of distress noted.

## 2019-04-26 LAB — HEPATITIS B SURFACE ANTIBODY, QUANTITATIVE: Hep B S AB Quant (Post): <3.1 m[IU]/mL — ABNORMAL LOW (ref >9.9)

## 2019-04-26 MED ORDER — OCTREOTIDE ACETATE 30 MG IM KIT
PACK | INTRAMUSCULAR | Status: AC
  Filled 2019-04-26: qty 1

## 2019-04-27 LAB — QUANTIFERON-TB GOLD PLUS (RQFGPL)
QuantiFERON Mitogen Value: 2.62 IU/mL
QuantiFERON Nil Value: 0.03 IU/mL
QuantiFERON TB1 Ag Value: 0.03 IU/mL
QuantiFERON TB2 Ag Value: 0.04 IU/mL

## 2019-04-27 LAB — QUANTIFERON-TB GOLD PLUS: QuantiFERON-TB Gold Plus: NEGATIVE

## 2019-05-02 ENCOUNTER — Encounter (HOSPITAL_COMMUNITY): Payer: Self-pay | Admitting: *Deleted

## 2019-05-02 NOTE — Progress Notes (Signed)
I spoke with Terrance Mass, PA with nephrology.  She is starting dialysis tomorrow and he wanted to check on her Retacrit.  Per Dr. Delton Coombes, he wants them to manage her epoetin injections there.  I advised him that she was getting 20,000 units weekly here and he states that they will adjust her dose accordingly for them.

## 2019-05-10 ENCOUNTER — Other Ambulatory Visit (HOSPITAL_COMMUNITY): Payer: Medicare PPO

## 2019-05-10 ENCOUNTER — Ambulatory Visit (HOSPITAL_COMMUNITY): Payer: Medicare PPO

## 2019-05-11 ENCOUNTER — Ambulatory Visit (HOSPITAL_COMMUNITY): Payer: Medicare PPO

## 2019-05-11 ENCOUNTER — Inpatient Hospital Stay (HOSPITAL_COMMUNITY): Payer: Medicare PPO

## 2019-05-16 ENCOUNTER — Other Ambulatory Visit: Payer: Self-pay

## 2019-05-16 ENCOUNTER — Inpatient Hospital Stay (HOSPITAL_COMMUNITY): Payer: Medicare PPO

## 2019-05-16 ENCOUNTER — Encounter (HOSPITAL_COMMUNITY): Payer: Self-pay

## 2019-05-16 ENCOUNTER — Inpatient Hospital Stay (HOSPITAL_COMMUNITY): Payer: Medicare PPO | Attending: Hematology

## 2019-05-16 VITALS — BP 104/51 | HR 67 | Temp 96.9°F | Resp 18

## 2019-05-16 DIAGNOSIS — Z5111 Encounter for antineoplastic chemotherapy: Secondary | ICD-10-CM | POA: Insufficient documentation

## 2019-05-16 DIAGNOSIS — C9000 Multiple myeloma not having achieved remission: Secondary | ICD-10-CM | POA: Diagnosis present

## 2019-05-16 LAB — CBC WITH DIFFERENTIAL/PLATELET
Abs Immature Granulocytes: 0.02 10*3/uL (ref 0.00–0.07)
Basophils Absolute: 0 10*3/uL (ref 0.0–0.1)
Basophils Relative: 1 %
Eosinophils Absolute: 0.1 10*3/uL (ref 0.0–0.5)
Eosinophils Relative: 2 %
HCT: 33.2 % — ABNORMAL LOW (ref 36.0–46.0)
Hemoglobin: 10.1 g/dL — ABNORMAL LOW (ref 12.0–15.0)
Immature Granulocytes: 0 %
Lymphocytes Relative: 20 %
Lymphs Abs: 1.2 10*3/uL (ref 0.7–4.0)
MCH: 31.7 pg (ref 26.0–34.0)
MCHC: 30.4 g/dL (ref 30.0–36.0)
MCV: 104.1 fL — ABNORMAL HIGH (ref 80.0–100.0)
Monocytes Absolute: 0.6 10*3/uL (ref 0.1–1.0)
Monocytes Relative: 11 %
Neutro Abs: 3.9 10*3/uL (ref 1.7–7.7)
Neutrophils Relative %: 66 %
Platelets: 301 10*3/uL (ref 150–400)
RBC: 3.19 MIL/uL — ABNORMAL LOW (ref 3.87–5.11)
RDW: 16.9 % — ABNORMAL HIGH (ref 11.5–15.5)
WBC: 5.8 10*3/uL (ref 4.0–10.5)
nRBC: 0 % (ref 0.0–0.2)

## 2019-05-16 LAB — LACTATE DEHYDROGENASE: LDH: 157 U/L (ref 98–192)

## 2019-05-16 LAB — COMPREHENSIVE METABOLIC PANEL
ALT: 12 U/L (ref 0–44)
AST: 26 U/L (ref 15–41)
Albumin: 3.5 g/dL (ref 3.5–5.0)
Alkaline Phosphatase: 48 U/L (ref 38–126)
Anion gap: 10 (ref 5–15)
BUN: 17 mg/dL (ref 8–23)
CO2: 30 mmol/L (ref 22–32)
Calcium: 8.8 mg/dL — ABNORMAL LOW (ref 8.9–10.3)
Chloride: 94 mmol/L — ABNORMAL LOW (ref 98–111)
Creatinine, Ser: 4.13 mg/dL — ABNORMAL HIGH (ref 0.44–1.00)
GFR calc Af Amer: 11 mL/min — ABNORMAL LOW (ref >60)
GFR calc non Af Amer: 10 mL/min — ABNORMAL LOW (ref >60)
Glucose, Bld: 135 mg/dL — ABNORMAL HIGH (ref 70–99)
Potassium: 3.2 mmol/L — ABNORMAL LOW (ref 3.5–5.1)
Sodium: 134 mmol/L — ABNORMAL LOW (ref 135–145)
Total Bilirubin: 0.4 mg/dL (ref 0.3–1.2)
Total Protein: 6.8 g/dL (ref 6.5–8.1)

## 2019-05-16 MED ORDER — BORTEZOMIB CHEMO SQ INJECTION 3.5 MG (2.5MG/ML)
1.3000 mg/m2 | Freq: Once | INTRAMUSCULAR | Status: AC
  Administered 2019-05-16: 2.5 mg via SUBCUTANEOUS
  Filled 2019-05-16: qty 1

## 2019-05-16 MED ORDER — DEXAMETHASONE 4 MG PO TABS
10.0000 mg | ORAL_TABLET | Freq: Once | ORAL | Status: AC
  Administered 2019-05-16: 10 mg via ORAL
  Filled 2019-05-16: qty 3

## 2019-05-16 MED ORDER — PROCHLORPERAZINE MALEATE 10 MG PO TABS
10.0000 mg | ORAL_TABLET | Freq: Once | ORAL | Status: AC
  Administered 2019-05-16: 10 mg via ORAL
  Filled 2019-05-16: qty 1

## 2019-05-16 NOTE — Progress Notes (Signed)
1440 Labs reviewed with Dr. Delton Coombes and pt approved for Velcade injection today per MD                                                       Carly Wood tolerated Velcade injection well without complaints or incident. VSS Pt discharged via wheelchair in satisfactory condition accompanied by her sister

## 2019-05-16 NOTE — Patient Instructions (Signed)
[facility] Discharge Instructions for Patients Receiving Chemotherapy   Beginning January 23rd 2017 lab work for the Cancer Center will be done in the  Main lab at [facility] on 1st floor. If you have a lab appointment with the Cancer Center please come in thru the  Main Entrance and check in at the main information desk   Today you received the following chemotherapy agents Velcade injection. Follow-up as scheduled. Call clinic for any questions or concerns  To help prevent nausea and vomiting after your treatment, we encourage you to take your nausea medication   If you develop nausea and vomiting, or diarrhea that is not controlled by your medication, call the clinic.  The clinic phone number is [phone]. Office hours are Monday-Friday 8:30am-5:00pm.  BELOW ARE SYMPTOMS THAT SHOULD BE REPORTED IMMEDIATELY:  *FEVER GREATER THAN 101.0 F  *CHILLS WITH OR WITHOUT FEVER  NAUSEA AND VOMITING THAT IS NOT CONTROLLED WITH YOUR NAUSEA MEDICATION  *UNUSUAL SHORTNESS OF BREATH  *UNUSUAL BRUISING OR BLEEDING  TENDERNESS IN MOUTH AND THROAT WITH OR WITHOUT PRESENCE OF ULCERS  *URINARY PROBLEMS  *BOWEL PROBLEMS  UNUSUAL RASH Items with * indicate a potential emergency and should be followed up as soon as possible. If you have an emergency after office hours please contact your primary care physician or go to the nearest emergency department.  Please call the clinic during office hours if you have any questions or concerns.   You may also contact the Patient Navigator at [phone] should you have any questions or need assistance in obtaining follow up care.      Resources For Cancer Patients and their Caregivers ? American Cancer Society: Can assist with transportation, wigs, general needs, runs Look Good Feel Better.        1-888-227-6333 ? Cancer Care: Provides financial assistance, online support groups, medication/co-pay assistance.   1-800-813-HOPE (4673) ? [facility] Assists [place] cancer patients and their families through emotional , educational and financial support.  336-427-4357 ? [place] DSS Where to apply for food stamps, Medicaid and utility assistance. 336-342-1394 ? [facility]: Transportation to medical appointments. 336-347-2287 ? Social Security Administration: May apply for disability if have a Stage IV cancer. 336-342-7796 1-800-772-1213 ? [place] Aging, Disability and Transit Services: Assists with nutrition, care and transit needs. 336-349-2343         

## 2019-05-17 ENCOUNTER — Other Ambulatory Visit (HOSPITAL_COMMUNITY): Payer: Medicare PPO

## 2019-05-17 ENCOUNTER — Ambulatory Visit (HOSPITAL_COMMUNITY): Payer: Medicare PPO | Admitting: Hematology

## 2019-05-17 ENCOUNTER — Ambulatory Visit (HOSPITAL_COMMUNITY): Payer: Medicare PPO

## 2019-05-17 LAB — PROTEIN ELECTROPHORESIS, SERUM
A/G Ratio: 1.4 (ref 0.7–1.7)
Albumin ELP: 3.5 g/dL (ref 2.9–4.4)
Alpha-1-Globulin: 0.3 g/dL (ref 0.0–0.4)
Alpha-2-Globulin: 0.7 g/dL (ref 0.4–1.0)
Beta Globulin: 1 g/dL (ref 0.7–1.3)
Gamma Globulin: 0.6 g/dL (ref 0.4–1.8)
Globulin, Total: 2.5 g/dL (ref 2.2–3.9)
Total Protein ELP: 6 g/dL (ref 6.0–8.5)

## 2019-05-17 LAB — KAPPA/LAMBDA LIGHT CHAINS
Kappa free light chain: 124.2 mg/L — ABNORMAL HIGH (ref 3.3–19.4)
Kappa, lambda light chain ratio: 0.84 (ref 0.26–1.65)
Lambda free light chains: 148.1 mg/L — ABNORMAL HIGH (ref 5.7–26.3)

## 2019-05-18 ENCOUNTER — Other Ambulatory Visit (HOSPITAL_COMMUNITY): Payer: Medicare PPO

## 2019-05-18 ENCOUNTER — Ambulatory Visit (HOSPITAL_COMMUNITY): Payer: Medicare PPO

## 2019-05-19 LAB — IMMUNOFIXATION ELECTROPHORESIS
IgA: 188 mg/dL (ref 64–422)
IgG (Immunoglobin G), Serum: 783 mg/dL (ref 586–1602)
IgM (Immunoglobulin M), Srm: 10 mg/dL — ABNORMAL LOW (ref 26–217)
Total Protein ELP: 5.9 g/dL — ABNORMAL LOW (ref 6.0–8.5)

## 2019-05-23 ENCOUNTER — Inpatient Hospital Stay (HOSPITAL_COMMUNITY): Payer: Medicare PPO

## 2019-05-23 ENCOUNTER — Other Ambulatory Visit: Payer: Self-pay

## 2019-05-23 ENCOUNTER — Encounter (HOSPITAL_COMMUNITY): Payer: Self-pay

## 2019-05-23 VITALS — BP 114/56 | HR 69 | Temp 97.1°F | Resp 18

## 2019-05-23 DIAGNOSIS — C9000 Multiple myeloma not having achieved remission: Secondary | ICD-10-CM

## 2019-05-23 DIAGNOSIS — Z5111 Encounter for antineoplastic chemotherapy: Secondary | ICD-10-CM | POA: Diagnosis not present

## 2019-05-23 LAB — CBC WITH DIFFERENTIAL/PLATELET
Abs Immature Granulocytes: 0.05 10*3/uL (ref 0.00–0.07)
Basophils Absolute: 0 10*3/uL (ref 0.0–0.1)
Basophils Relative: 1 %
Eosinophils Absolute: 0.1 10*3/uL (ref 0.0–0.5)
Eosinophils Relative: 2 %
HCT: 34.5 % — ABNORMAL LOW (ref 36.0–46.0)
Hemoglobin: 10.7 g/dL — ABNORMAL LOW (ref 12.0–15.0)
Immature Granulocytes: 1 %
Lymphocytes Relative: 18 %
Lymphs Abs: 1 10*3/uL (ref 0.7–4.0)
MCH: 31.7 pg (ref 26.0–34.0)
MCHC: 31 g/dL (ref 30.0–36.0)
MCV: 102.1 fL — ABNORMAL HIGH (ref 80.0–100.0)
Monocytes Absolute: 0.6 10*3/uL (ref 0.1–1.0)
Monocytes Relative: 10 %
Neutro Abs: 4 10*3/uL (ref 1.7–7.7)
Neutrophils Relative %: 68 %
Platelets: 263 10*3/uL (ref 150–400)
RBC: 3.38 MIL/uL — ABNORMAL LOW (ref 3.87–5.11)
RDW: 17.4 % — ABNORMAL HIGH (ref 11.5–15.5)
WBC: 5.8 10*3/uL (ref 4.0–10.5)
nRBC: 0.3 % — ABNORMAL HIGH (ref 0.0–0.2)

## 2019-05-23 LAB — COMPREHENSIVE METABOLIC PANEL
ALT: 13 U/L (ref 0–44)
AST: 20 U/L (ref 15–41)
Albumin: 3.6 g/dL (ref 3.5–5.0)
Alkaline Phosphatase: 48 U/L (ref 38–126)
Anion gap: 10 (ref 5–15)
BUN: 19 mg/dL (ref 8–23)
CO2: 30 mmol/L (ref 22–32)
Calcium: 8.7 mg/dL — ABNORMAL LOW (ref 8.9–10.3)
Chloride: 95 mmol/L — ABNORMAL LOW (ref 98–111)
Creatinine, Ser: 4.26 mg/dL — ABNORMAL HIGH (ref 0.44–1.00)
GFR calc Af Amer: 11 mL/min — ABNORMAL LOW (ref >60)
GFR calc non Af Amer: 9 mL/min — ABNORMAL LOW (ref >60)
Glucose, Bld: 108 mg/dL — ABNORMAL HIGH (ref 70–99)
Potassium: 3.7 mmol/L (ref 3.5–5.1)
Sodium: 135 mmol/L (ref 135–145)
Total Bilirubin: 0.5 mg/dL (ref 0.3–1.2)
Total Protein: 6.6 g/dL (ref 6.5–8.1)

## 2019-05-23 LAB — LACTATE DEHYDROGENASE: LDH: 148 U/L (ref 98–192)

## 2019-05-23 MED ORDER — DEXAMETHASONE 4 MG PO TABS
10.0000 mg | ORAL_TABLET | Freq: Once | ORAL | Status: AC
  Administered 2019-05-23: 10 mg via ORAL
  Filled 2019-05-23: qty 3

## 2019-05-23 MED ORDER — PROCHLORPERAZINE MALEATE 10 MG PO TABS
10.0000 mg | ORAL_TABLET | Freq: Once | ORAL | Status: AC
  Administered 2019-05-23: 10 mg via ORAL
  Filled 2019-05-23: qty 1

## 2019-05-23 MED ORDER — BORTEZOMIB CHEMO SQ INJECTION 3.5 MG (2.5MG/ML)
1.3000 mg/m2 | Freq: Once | INTRAMUSCULAR | Status: AC
  Administered 2019-05-23: 2.5 mg via SUBCUTANEOUS
  Filled 2019-05-23: qty 1

## 2019-05-23 NOTE — Patient Instructions (Signed)
[facility] Discharge Instructions for Patients Receiving Chemotherapy  Today you received the following chemotherapy agents   To help prevent nausea and vomiting after your treatment, we encourage you to take your nausea medication   If you develop nausea and vomiting that is not controlled by your nausea medication, call the clinic.   BELOW ARE SYMPTOMS THAT SHOULD BE REPORTED IMMEDIATELY:  *FEVER GREATER THAN 100.5 F  *CHILLS WITH OR WITHOUT FEVER  NAUSEA AND VOMITING THAT IS NOT CONTROLLED WITH YOUR NAUSEA MEDICATION  *UNUSUAL SHORTNESS OF BREATH  *UNUSUAL BRUISING OR BLEEDING  TENDERNESS IN MOUTH AND THROAT WITH OR WITHOUT PRESENCE OF ULCERS  *URINARY PROBLEMS  *BOWEL PROBLEMS  UNUSUAL RASH Items with * indicate a potential emergency and should be followed up as soon as possible.  Feel free to call the clinic should you have any questions or concerns. The clinic phone number is [phone].  Please show the CHEMO ALERT CARD at check-in to the Emergency Department and triage nurse.   

## 2019-05-23 NOTE — Progress Notes (Signed)
Patient presents today for Velcade injection. MAR reviewed and updated. Labs within parameters for treatment. Vital signs within parameters for treatment. Patient has no complaints of any pain today. Patient states the only change since her last treatment is she is currently on Dialysis.   Velcade given today per MD orders. Tolerated injection without adverse affects. Vital signs stable. No complaints at this time. Discharged from clinic via wheel chair. F/U with [facility] as scheduled.

## 2019-05-24 ENCOUNTER — Other Ambulatory Visit (HOSPITAL_COMMUNITY): Payer: Medicare PPO

## 2019-05-24 ENCOUNTER — Ambulatory Visit (HOSPITAL_COMMUNITY): Payer: Medicare PPO

## 2019-05-24 LAB — KAPPA/LAMBDA LIGHT CHAINS
Kappa free light chain: 133.2 mg/L — ABNORMAL HIGH (ref 3.3–19.4)
Kappa, lambda light chain ratio: 0.96 (ref 0.26–1.65)
Lambda free light chains: 138.5 mg/L — ABNORMAL HIGH (ref 5.7–26.3)

## 2019-05-24 MED ORDER — OCTREOTIDE ACETATE 30 MG IM KIT
PACK | INTRAMUSCULAR | Status: AC
  Filled 2019-05-24: qty 1

## 2019-05-25 ENCOUNTER — Ambulatory Visit (HOSPITAL_COMMUNITY): Payer: Medicare PPO | Admitting: Hematology

## 2019-05-25 ENCOUNTER — Ambulatory Visit (HOSPITAL_COMMUNITY): Payer: Medicare PPO

## 2019-05-25 ENCOUNTER — Other Ambulatory Visit (HOSPITAL_COMMUNITY): Payer: Medicare PPO

## 2019-05-25 LAB — PROTEIN ELECTROPHORESIS, SERUM
A/G Ratio: 1.5 (ref 0.7–1.7)
Albumin ELP: 3.6 g/dL (ref 2.9–4.4)
Alpha-1-Globulin: 0.2 g/dL (ref 0.0–0.4)
Alpha-2-Globulin: 0.7 g/dL (ref 0.4–1.0)
Beta Globulin: 0.9 g/dL (ref 0.7–1.3)
Gamma Globulin: 0.6 g/dL (ref 0.4–1.8)
Globulin, Total: 2.4 g/dL (ref 2.2–3.9)
Total Protein ELP: 6 g/dL (ref 6.0–8.5)

## 2019-05-30 ENCOUNTER — Inpatient Hospital Stay (HOSPITAL_COMMUNITY): Payer: Medicare PPO

## 2019-05-30 ENCOUNTER — Other Ambulatory Visit: Payer: Self-pay

## 2019-05-30 VITALS — BP 104/53 | HR 62 | Temp 97.4°F | Resp 18 | Wt 165.0 lb

## 2019-05-30 DIAGNOSIS — Z5111 Encounter for antineoplastic chemotherapy: Secondary | ICD-10-CM | POA: Diagnosis not present

## 2019-05-30 DIAGNOSIS — C9000 Multiple myeloma not having achieved remission: Secondary | ICD-10-CM

## 2019-05-30 LAB — CBC WITH DIFFERENTIAL/PLATELET
Abs Immature Granulocytes: 0.02 10*3/uL (ref 0.00–0.07)
Basophils Absolute: 0 10*3/uL (ref 0.0–0.1)
Basophils Relative: 1 %
Eosinophils Absolute: 0.1 10*3/uL (ref 0.0–0.5)
Eosinophils Relative: 2 %
HCT: 34.5 % — ABNORMAL LOW (ref 36.0–46.0)
Hemoglobin: 10.6 g/dL — ABNORMAL LOW (ref 12.0–15.0)
Immature Granulocytes: 0 %
Lymphocytes Relative: 12 %
Lymphs Abs: 0.7 10*3/uL (ref 0.7–4.0)
MCH: 32.6 pg (ref 26.0–34.0)
MCHC: 30.7 g/dL (ref 30.0–36.0)
MCV: 106.2 fL — ABNORMAL HIGH (ref 80.0–100.0)
Monocytes Absolute: 0.4 10*3/uL (ref 0.1–1.0)
Monocytes Relative: 7 %
Neutro Abs: 4.6 10*3/uL (ref 1.7–7.7)
Neutrophils Relative %: 78 %
Platelets: 203 10*3/uL (ref 150–400)
RBC: 3.25 MIL/uL — ABNORMAL LOW (ref 3.87–5.11)
RDW: 18.6 % — ABNORMAL HIGH (ref 11.5–15.5)
WBC: 5.8 10*3/uL (ref 4.0–10.5)
nRBC: 0 % (ref 0.0–0.2)

## 2019-05-30 LAB — COMPREHENSIVE METABOLIC PANEL
ALT: 10 U/L (ref 0–44)
AST: 23 U/L (ref 15–41)
Albumin: 3.4 g/dL — ABNORMAL LOW (ref 3.5–5.0)
Alkaline Phosphatase: 50 U/L (ref 38–126)
Anion gap: 9 (ref 5–15)
BUN: 18 mg/dL (ref 8–23)
CO2: 30 mmol/L (ref 22–32)
Calcium: 8.6 mg/dL — ABNORMAL LOW (ref 8.9–10.3)
Chloride: 98 mmol/L (ref 98–111)
Creatinine, Ser: 4.54 mg/dL — ABNORMAL HIGH (ref 0.44–1.00)
GFR calc Af Amer: 10 mL/min — ABNORMAL LOW (ref >60)
GFR calc non Af Amer: 8 mL/min — ABNORMAL LOW (ref >60)
Glucose, Bld: 225 mg/dL — ABNORMAL HIGH (ref 70–99)
Potassium: 3.8 mmol/L (ref 3.5–5.1)
Sodium: 137 mmol/L (ref 135–145)
Total Bilirubin: 0.5 mg/dL (ref 0.3–1.2)
Total Protein: 6.3 g/dL — ABNORMAL LOW (ref 6.5–8.1)

## 2019-05-30 MED ORDER — DEXAMETHASONE 4 MG PO TABS
10.0000 mg | ORAL_TABLET | Freq: Once | ORAL | Status: AC
  Administered 2019-05-30: 10 mg via ORAL
  Filled 2019-05-30: qty 3

## 2019-05-30 MED ORDER — PROCHLORPERAZINE MALEATE 10 MG PO TABS
10.0000 mg | ORAL_TABLET | Freq: Once | ORAL | Status: AC
  Administered 2019-05-30: 10 mg via ORAL
  Filled 2019-05-30: qty 1

## 2019-05-30 MED ORDER — BORTEZOMIB CHEMO SQ INJECTION 3.5 MG (2.5MG/ML)
1.3000 mg/m2 | Freq: Once | INTRAMUSCULAR | Status: AC
  Administered 2019-05-30: 2.5 mg via SUBCUTANEOUS
  Filled 2019-05-30: qty 1

## 2019-05-30 NOTE — Progress Notes (Signed)
Serum Creatinine 4.54 today.  Dr. Delton Coombes and Francene Finders, NP, notified.  Ok to treat verbal order Dr. Delton Coombes.   Patient tolerated Velcade injection with no complaints voiced.  Lab work reviewed.  See MAR for details.  Injection site clean and dry with no bruising or swelling noted.  Band aid applied.  VSS.  Patient left by wheelchair with no s/s of distress noted.

## 2019-06-05 ENCOUNTER — Other Ambulatory Visit (HOSPITAL_COMMUNITY): Payer: Self-pay | Admitting: *Deleted

## 2019-06-05 DIAGNOSIS — C9000 Multiple myeloma not having achieved remission: Secondary | ICD-10-CM

## 2019-06-13 ENCOUNTER — Inpatient Hospital Stay (HOSPITAL_COMMUNITY): Payer: Medicare PPO

## 2019-06-13 ENCOUNTER — Inpatient Hospital Stay (HOSPITAL_COMMUNITY): Payer: Medicare PPO | Attending: Hematology

## 2019-06-13 ENCOUNTER — Other Ambulatory Visit: Payer: Self-pay

## 2019-06-13 ENCOUNTER — Encounter (HOSPITAL_COMMUNITY): Payer: Self-pay | Admitting: Hematology

## 2019-06-13 ENCOUNTER — Inpatient Hospital Stay (HOSPITAL_COMMUNITY): Payer: Medicare PPO | Admitting: Hematology

## 2019-06-13 VITALS — BP 110/44 | HR 60 | Temp 96.9°F | Resp 16 | Wt 142.1 lb

## 2019-06-13 DIAGNOSIS — Z8042 Family history of malignant neoplasm of prostate: Secondary | ICD-10-CM | POA: Insufficient documentation

## 2019-06-13 DIAGNOSIS — Z79899 Other long term (current) drug therapy: Secondary | ICD-10-CM | POA: Diagnosis not present

## 2019-06-13 DIAGNOSIS — Z807 Family history of other malignant neoplasms of lymphoid, hematopoietic and related tissues: Secondary | ICD-10-CM | POA: Diagnosis not present

## 2019-06-13 DIAGNOSIS — K219 Gastro-esophageal reflux disease without esophagitis: Secondary | ICD-10-CM | POA: Diagnosis not present

## 2019-06-13 DIAGNOSIS — Z79811 Long term (current) use of aromatase inhibitors: Secondary | ICD-10-CM | POA: Diagnosis not present

## 2019-06-13 DIAGNOSIS — D649 Anemia, unspecified: Secondary | ICD-10-CM | POA: Insufficient documentation

## 2019-06-13 DIAGNOSIS — C9000 Multiple myeloma not having achieved remission: Secondary | ICD-10-CM | POA: Diagnosis not present

## 2019-06-13 DIAGNOSIS — E119 Type 2 diabetes mellitus without complications: Secondary | ICD-10-CM | POA: Insufficient documentation

## 2019-06-13 DIAGNOSIS — Z5112 Encounter for antineoplastic immunotherapy: Secondary | ICD-10-CM | POA: Diagnosis not present

## 2019-06-13 DIAGNOSIS — I10 Essential (primary) hypertension: Secondary | ICD-10-CM | POA: Insufficient documentation

## 2019-06-13 DIAGNOSIS — N186 End stage renal disease: Secondary | ICD-10-CM | POA: Diagnosis not present

## 2019-06-13 DIAGNOSIS — Z992 Dependence on renal dialysis: Secondary | ICD-10-CM | POA: Insufficient documentation

## 2019-06-13 DIAGNOSIS — Z7982 Long term (current) use of aspirin: Secondary | ICD-10-CM | POA: Diagnosis not present

## 2019-06-13 DIAGNOSIS — F419 Anxiety disorder, unspecified: Secondary | ICD-10-CM | POA: Diagnosis not present

## 2019-06-13 DIAGNOSIS — C50411 Malignant neoplasm of upper-outer quadrant of right female breast: Secondary | ICD-10-CM | POA: Diagnosis not present

## 2019-06-13 DIAGNOSIS — Z794 Long term (current) use of insulin: Secondary | ICD-10-CM | POA: Insufficient documentation

## 2019-06-13 DIAGNOSIS — Z9011 Acquired absence of right breast and nipple: Secondary | ICD-10-CM | POA: Insufficient documentation

## 2019-06-13 DIAGNOSIS — Z808 Family history of malignant neoplasm of other organs or systems: Secondary | ICD-10-CM | POA: Insufficient documentation

## 2019-06-13 DIAGNOSIS — Z170 Estrogen receptor positive status [ER+]: Secondary | ICD-10-CM | POA: Diagnosis not present

## 2019-06-13 DIAGNOSIS — Z8249 Family history of ischemic heart disease and other diseases of the circulatory system: Secondary | ICD-10-CM | POA: Insufficient documentation

## 2019-06-13 DIAGNOSIS — Z823 Family history of stroke: Secondary | ICD-10-CM | POA: Diagnosis not present

## 2019-06-13 DIAGNOSIS — M8580 Other specified disorders of bone density and structure, unspecified site: Secondary | ICD-10-CM | POA: Diagnosis not present

## 2019-06-13 LAB — CBC WITH DIFFERENTIAL/PLATELET
Abs Immature Granulocytes: 0.02 10*3/uL (ref 0.00–0.07)
Basophils Absolute: 0.1 10*3/uL (ref 0.0–0.1)
Basophils Relative: 1 %
Eosinophils Absolute: 0.4 10*3/uL (ref 0.0–0.5)
Eosinophils Relative: 7 %
HCT: 39.5 % (ref 36.0–46.0)
Hemoglobin: 12 g/dL (ref 12.0–15.0)
Immature Granulocytes: 0 %
Lymphocytes Relative: 13 %
Lymphs Abs: 0.7 10*3/uL (ref 0.7–4.0)
MCH: 32.9 pg (ref 26.0–34.0)
MCHC: 30.4 g/dL (ref 30.0–36.0)
MCV: 108.2 fL — ABNORMAL HIGH (ref 80.0–100.0)
Monocytes Absolute: 0.4 10*3/uL (ref 0.1–1.0)
Monocytes Relative: 7 %
Neutro Abs: 4 10*3/uL (ref 1.7–7.7)
Neutrophils Relative %: 72 %
Platelets: 284 10*3/uL (ref 150–400)
RBC: 3.65 MIL/uL — ABNORMAL LOW (ref 3.87–5.11)
RDW: 17.7 % — ABNORMAL HIGH (ref 11.5–15.5)
WBC: 5.7 10*3/uL (ref 4.0–10.5)
nRBC: 0 % (ref 0.0–0.2)

## 2019-06-13 LAB — COMPREHENSIVE METABOLIC PANEL
ALT: 9 U/L (ref 0–44)
AST: 22 U/L (ref 15–41)
Albumin: 3.6 g/dL (ref 3.5–5.0)
Alkaline Phosphatase: 54 U/L (ref 38–126)
Anion gap: 10 (ref 5–15)
BUN: 14 mg/dL (ref 8–23)
CO2: 31 mmol/L (ref 22–32)
Calcium: 9.2 mg/dL (ref 8.9–10.3)
Chloride: 97 mmol/L — ABNORMAL LOW (ref 98–111)
Creatinine, Ser: 4.21 mg/dL — ABNORMAL HIGH (ref 0.44–1.00)
GFR calc Af Amer: 11 mL/min — ABNORMAL LOW (ref >60)
GFR calc non Af Amer: 9 mL/min — ABNORMAL LOW (ref >60)
Glucose, Bld: 221 mg/dL — ABNORMAL HIGH (ref 70–99)
Potassium: 3.8 mmol/L (ref 3.5–5.1)
Sodium: 138 mmol/L (ref 135–145)
Total Bilirubin: 0.4 mg/dL (ref 0.3–1.2)
Total Protein: 6.4 g/dL — ABNORMAL LOW (ref 6.5–8.1)

## 2019-06-13 LAB — LACTATE DEHYDROGENASE: LDH: 141 U/L (ref 98–192)

## 2019-06-13 MED ORDER — DEXAMETHASONE 4 MG PO TABS
10.0000 mg | ORAL_TABLET | Freq: Once | ORAL | Status: AC
  Administered 2019-06-13: 10 mg via ORAL
  Filled 2019-06-13: qty 3

## 2019-06-13 MED ORDER — PROCHLORPERAZINE MALEATE 10 MG PO TABS
10.0000 mg | ORAL_TABLET | Freq: Once | ORAL | Status: AC
  Administered 2019-06-13: 10 mg via ORAL
  Filled 2019-06-13: qty 1

## 2019-06-13 MED ORDER — BORTEZOMIB CHEMO SQ INJECTION 3.5 MG (2.5MG/ML)
2.2500 mg | Freq: Once | INTRAMUSCULAR | Status: AC
  Administered 2019-06-13: 13:00:00 2.25 mg via SUBCUTANEOUS
  Filled 2019-06-13: qty 0.9

## 2019-06-13 NOTE — Progress Notes (Signed)
06/13/19  Weight loss >10% adjust dose to new BSA 1.69 m2 = 2.25 mg  T.O. Dr Rhys Martini, PharmD

## 2019-06-13 NOTE — Assessment & Plan Note (Signed)
1.  IgA lambda plasma cell myeloma, stage II, standard risk: -Bone marrow biopsy on 09/20/2018 shows plasma cell myeloma with 30% plasma cells.  FISH panel was normal.  Chromosome analysis was normal. -Labs and diagnosis on 08/29/2018 shows M spike 0.9 g.  Kappa light chains 148, lambda light chains 1132, ratio 0.13.  LDH normal.  Beta-2 microglobulin 13.5. -She had transfusion dependent anemia. -Velcade and dexamethasone started on 10/11/2018. -We reviewed myeloma panel from 05/23/2019.  M spike is negative.  Lambda light chains improved to 138.  Light chain ratio is 0.96.  Immunofixation is positive for IgA lambda monoclonal protein. -CBC shows hemoglobin 12.  Platelet count and white cells are normal. -She is continuing to tolerate treatments very well.  She will continue with it.  I will reevaluate her in 4 weeks.  2.  ESRD on HD: -She was started on hemodialysis and undergoes on Monday, Wednesday and Friday.  She is tolerating it very well.  3.  Bone strengthening: -Bisphosphonates not started due to her poor renal function.  4.  Osteopenia: -Bone density on 05/16/2015 shows osteopenia.  We will plan to repeat the scan soon.  5.  Stage I IDC of the right breast, ER/PR positive, HER-2 negative: -She will continue anastrozole and is tolerating it very well.  Mild right upper extremity lymphedema stable.

## 2019-06-13 NOTE — Progress Notes (Signed)
[address] [place], [place] [address]   CLINIC:  Medical Oncology/Hematology  PCP:  Monico Blitz, [place] [address] [phone]   REASON FOR VISIT:  Follow-up for plasma cell myeloma and anemia.  CURRENT THERAPY: Velcade, dexamethasone  BRIEF ONCOLOGIC HISTORY:  Oncology History  Stage 1 infiltrating ductal carcinoma of right female breast ([place])  09/12/2014 Mammogram   Mass in upper outer R breast, middle third depth appears slightly larger than it was on prior exam with more irreg spiculated margins   10/02/2014 Pathology Results   biopsy with invasive ductal carcinoma high grade 1.1 cm, dcis solid type. additional R breast tissue, excision, invasive ductal high grade 0.9 cm    10/24/2014 Pathology Results   no residual invasive carcinoma, DCIS, focal, atypical ductal hyperplasia, 0/7 LN positive for metastatic carcinoma ER > 90%, PR 30%, HER 2 2+   10/24/2014 Cancer Staging   T1cN0M0   10/24/2014 Surgery   R mastectomy, T1c, N0   12/28/2014 - 11/22/2015 Chemotherapy   Taxol/herceptin weekly X 12, Herceptin every 21 days, last due in September 2017   03/11/2015 -  Anti-estrogen oral therapy   Arimidex 1 mg daily   09/12/2015 Imaging   MUGA- The left ventricular ejection fraction equals 68%.   06/08/2016 Treatment Plan Change   Started Nerlynx   Multiple myeloma ([place])  10/04/2018 Initial Diagnosis   Multiple myeloma ([place])   10/11/2018 -  Chemotherapy   The patient had bortezomib SQ (VELCADE) chemo injection 2.5 mg, 1.3 mg/m2 = 2.5 mg, Subcutaneous,  Once, 10 of 11 cycles Administration: 2.5 mg (10/11/2018), 2.5 mg (10/18/2018), 2.5 mg (10/25/2018), 2.5 mg (11/01/2018), 2.5 mg (11/08/2018), 2.5 mg (11/15/2018), 2.5 mg (11/22/2018), 2.5 mg (11/29/2018), 2.5 mg (12/06/2018), 2.5 mg (12/20/2018), 2.5 mg (12/27/2018), 2.5 mg (01/03/2019), 2.5 mg (01/17/2019), 2.5 mg (01/31/2019), 2.5 mg (02/14/2019), 2.5 mg (02/21/2019), 2.5 mg (02/28/2019), 2.5 mg  (03/14/2019), 2.5 mg (03/21/2019), 2.5 mg (03/28/2019), 2.5 mg (04/11/2019), 2.5 mg (04/18/2019), 2.5 mg (04/25/2019), 2.5 mg (05/16/2019), 2.5 mg (05/23/2019), 2.5 mg (05/30/2019), 2.25 mg (06/13/2019)  for chemotherapy treatment.       CANCER STAGING: Cancer Staging Stage 1 infiltrating ductal carcinoma of right female breast [facility]) Staging form: Breast, AJCC 7th Edition - Clinical stage from 08/30/2015: Stage IA (T1c, N0, M0) - Signed by Baird Cancer, PA-C on 08/30/2015    INTERVAL HISTORY:  Carly Wood 84 y.o. female seen for follow-up of multiple myeloma.  Reports appetite of 100%.  Energy levels are 25%.  Occasional nosebleeds which are stable.  Leg swellings have much improved.  She is started on dialysis and is undergoing dialysis on Monday, Wednesday and Friday.  Does not report any tingling or numbness in extremities.  REVIEW OF SYSTEMS:  Review of Systems  HENT:   Positive for nosebleeds.   All other systems reviewed and are negative.    PAST MEDICAL/SURGICAL HISTORY:  Past Medical History:  Diagnosis Date  . Anemia   . Anxiety   . Chronic kidney disease   . Chronic renal disease, stage 4, severely decreased glomerular filtration rate (GFR) between 15-29 mL/min/1.73 square meter (HCC) 08/22/2015  . Complication of anesthesia    delirious after Breast Surgery  . Dementia ([place])    mild  . Depression   . Diabetes mellitus without complication ([place])    type II  . Dyspnea    with activity  . GERD (gastroesophageal reflux disease)   . Glaucoma   .  Hypertension   . Pneumonia   . Stage 1 infiltrating ductal carcinoma of right female breast ([place]) 08/21/2015   ER+ PR+ HER 2 neu + (3+) T1cN0    Past Surgical History:  Procedure Laterality Date  . AV FISTULA PLACEMENT Left 11/22/2017   Procedure: ARTERIOVENOUS (AV) FISTULA CREATION LEFT ARM;  Surgeon: Elam Dutch, MD;  Location: [address];  Service: Vascular;  Laterality: Left;  . BIOPSY  08/07/2016   Procedure: BIOPSY;  Surgeon:  Daneil Dolin, MD;  [facility];  Service: Endoscopy;;  gastric ulcer biopsy  . COLONOSCOPY    . ESOPHAGOGASTRODUODENOSCOPY N/A 08/07/2016   Procedure: ESOPHAGOGASTRODUODENOSCOPY (EGD);  Surgeon: Daneil Dolin, MD;  [facility];  Service: Endoscopy;  Laterality: N/A;  1215-rescheduled to 6/1 @ 2:30pm per Tretha Sciara  . ESOPHAGOGASTRODUODENOSCOPY N/A 11/27/2016   Procedure: ESOPHAGOGASTRODUODENOSCOPY (EGD);  Surgeon: Daneil Dolin, MD;  [facility];  Service: Endoscopy;  Laterality: N/A;  8:15am  . FISTULA SUPERFICIALIZATION Left 02/14/2018   Procedure: FISTULA SUPERFICIALIZATION LEFT ARM;  Surgeon: Angelia Mould, MD;  Location: [facility];  Service: Vascular;  Laterality: Left;  . FRACTURE SURGERY Right    ankle  . MALONEY DILATION N/A 08/07/2016   Procedure: Venia Minks DILATION;  Surgeon: Daneil Dolin, MD;  [facility];  Service: Endoscopy;  Laterality: N/A;  . MASTECTOMY, PARTIAL Right   . PORT-A-CATH REMOVAL Left 11/22/2017   Procedure: REMOVAL PORT-A-CATH LEFT CHEST;  Surgeon: Elam Dutch, MD;  Location: [facility] OR;  Service: Vascular;  Laterality: Left;  . RETINAL DETACHMENT SURGERY Right      SOCIAL HISTORY:  Social History   Socioeconomic History  . Marital status: Single    Spouse name: Not on file  . Number of children: Not on file  . Years of education: Not on file  . Highest education level: Not on file  Occupational History  . Not on file  Tobacco Use  . Smoking status: Never Smoker  . Smokeless tobacco: Never Used  Substance and Sexual Activity  . Alcohol use: No    Alcohol/week: 0.0 standard drinks  . Drug use: No  . Sexual activity: Not on file  Other Topics Concern  . Not on file  Social History Narrative  . Not on file   Social Determinants of Health   Financial Resource Strain:   . Difficulty of Paying Living Expenses:   Food Insecurity:   . Worried About Charity fundraiser in the Last Year:   . Arts development officer in the Last Year:   Transportation Needs:   . Film/video editor (Medical):   Marland Kitchen Lack of Transportation (Non-Medical):   Physical Activity:   . Days of Exercise per Week:   . Minutes of Exercise per Session:   Stress:   . Feeling of Stress :   Social Connections:   . Frequency of Communication with Friends and Family:   . Frequency of Social Gatherings with Friends and Family:   . Attends Religious Services:   . Active Member of Clubs or Organizations:   . Attends Archivist Meetings:   Marland Kitchen Marital Status:   Intimate Partner Violence:   . Fear of Current or Ex-Partner:   . Emotionally Abused:   Marland Kitchen Physically Abused:   . Sexually Abused:     FAMILY HISTORY:  Family History  Problem Relation Age of Onset  . Multiple myeloma Sister   . Brain cancer Sister   .  Dementia Mother        died at 49  . Stroke Mother   . Heart failure Mother   . Diabetes Mother   . Heart disease Father   . Prostate cancer Brother   . Colon cancer Neg Hx     CURRENT MEDICATIONS:  Outpatient Encounter Medications as of 06/13/2019  Medication Sig  . ACCU-CHEK AVIVA PLUS test strip   . acetaminophen (TYLENOL) 500 MG tablet Take 500 mg by mouth every 6 (six) hours as needed.   Marland Kitchen acyclovir (ZOVIRAX) 200 MG capsule TAKE 1 CAPSULE BY MOUTH TWICE DAILY  . Alcohol Swabs (ALCOHOL PREP) 70 % PADS See admin instructions.  Marland Kitchen amLODipine (NORVASC) 10 MG tablet Take 10 mg by mouth daily.  Marland Kitchen anastrozole (ARIMIDEX) 1 MG tablet TAKE 1 TABLET BY MOUTH DAILY  . AQUALANCE LANCETS 30G MISC USE TO test twice daily  . aspirin EC 81 MG tablet Take 81 mg by mouth daily.  . bortezomib IV (VELCADE) 3.5 MG injection Inject into the vein once a week. Weekly every 21 days  . calcitRIOL (ROCALTROL) 0.25 MCG capsule Take 0.25 mcg by mouth daily at 12 noon.   . calcium-vitamin D (OSCAL 500/200 D-3) 500-200 MG-UNIT tablet Take 1 tablet by mouth daily at 12 noon.   . cloNIDine (CATAPRES) 0.3 MG tablet Take 0.3  mg by mouth 2 (two) times daily.   . diphenhydrAMINE (BENADRYL) 25 MG tablet Take 25 mg by mouth every 6 (six) hours as needed.  Marland Kitchen epoetin alfa (EPOGEN,PROCRIT) 4000 UNIT/ML injection Inject 4,000 Units into the vein.  Marland Kitchen esomeprazole (NEXIUM) 40 MG capsule Take by mouth.  . furosemide (LASIX) 40 MG tablet Take by mouth.  Ralene Bathe EASE INJECT PEN NEEDLES 31G X 8 MM MISC USE EVERY DAY AS DIRECTED  . IFEREX 150 150 MG capsule Take 150 mg by mouth daily.  Elmore Guise Devices (ADJUSTABLE LANCING DEVICE) MISC TO check blood glucose  . lidocaine-prilocaine (EMLA) cream Apply topically.  . Multiple Vitamin (MULTIVITAMIN) capsule Take 1 capsule by mouth daily.  . ONGLYZA 5 MG TABS tablet Take 5 mg by mouth daily.  Marland Kitchen PARoxetine (PAXIL) 20 MG tablet Take 20 mg by mouth daily.   . pioglitazone (ACTOS) 30 MG tablet Take 15 mg by mouth daily.   . pravastatin (PRAVACHOL) 20 MG tablet Take 20 mg by mouth every evening.   . prochlorperazine (COMPAZINE) 10 MG tablet Take by mouth.  . TOUJEO SOLOSTAR 300 UNIT/ML SOPN INJECT FIVE UNITS INTO THE SKIN DAILY (270 DAY SUPPLY)  . traZODone (DESYREL) 100 MG tablet Take 100 mg by mouth at bedtime.   No facility-administered encounter medications on file as of 06/13/2019.    ALLERGIES:  Allergies  Allergen Reactions  . Penicillins Other (See Comments)    Unsure of reaction Has patient had a PCN reaction causing immediate rash, facial/tongue/throat swelling, SOB or lightheadedness with hypotension: Unknown Has patient had a PCN reaction causing severe rash involving mucus membranes or skin necrosis: Unknown Has patient had a PCN reaction that required hospitalization: No Has patient had a PCN reaction occurring within the last 10 years: Unknown If all of the above answers are "NO", then may proceed with Cephalosporin use.    . Ace Inhibitors Cough and Other (See Comments)    Tongue swell  . Lisinopril Other (See Comments)    Tongue swell     PHYSICAL EXAM:   ECOG Performance status: 2  Vitals:   06/13/19 1115  BP: (!) 110/44  Pulse: 60  Resp: 16  Temp: (!) 96.9 F (36.1 C)  SpO2: 99%   Filed Weights   06/13/19 1115  Weight: 142 lb 1 oz (64.4 kg)    Physical Exam Constitutional:      Appearance: Normal appearance.  Cardiovascular:     Rate and Rhythm: Normal rate and regular rhythm.     Pulses: Normal pulses.     Heart sounds: Normal heart sounds.  Pulmonary:     Effort: Pulmonary effort is normal.     Breath sounds: Normal breath sounds.  Abdominal:     General: There is no distension.     Palpations: Abdomen is soft. There is no mass.  Musculoskeletal:     Cervical back: Normal range of motion.     Right lower leg: No edema.     Left lower leg: No edema.  Skin:    General: Skin is warm.  Neurological:     General: No focal deficit present.     Mental Status: She is alert and oriented to person, place, and time.  Psychiatric:        Mood and Affect: Mood normal.        Behavior: Behavior normal.        Thought Content: Thought content normal.        Judgment: Judgment normal.      LABORATORY DATA:  I have reviewed the labs as listed.  CBC    Component Value Date/Time   WBC 5.7 06/13/2019 1013   RBC 3.65 (L) 06/13/2019 1013   HGB 12.0 06/13/2019 1013   HCT 39.5 06/13/2019 1013   PLT 284 06/13/2019 1013   MCV 108.2 (H) 06/13/2019 1013   MCH 32.9 06/13/2019 1013   MCHC 30.4 06/13/2019 1013   RDW 17.7 (H) 06/13/2019 1013   LYMPHSABS 0.7 06/13/2019 1013   MONOABS 0.4 06/13/2019 1013   EOSABS 0.4 06/13/2019 1013   BASOSABS 0.1 06/13/2019 1013   CMP Latest Ref Rng & Units 06/13/2019 05/30/2019 05/23/2019  Glucose 70 - 99 mg/dL 221(H) 225(H) 108(H)  BUN 8 - 23 mg/dL '14 18 19  ' Creatinine 0.44 - 1.00 mg/dL 4.21(H) 4.54(H) 4.26(H)  Sodium 135 - 145 mmol/L 138 137 135  Potassium 3.5 - 5.1 mmol/L 3.8 3.8 3.7  Chloride 98 - 111 mmol/L 97(L) 98 95(L)  CO2 22 - 32 mmol/L '31 30 30  ' Calcium 8.9 - 10.3 mg/dL 9.2  8.6(L) 8.7(L)  Total Protein 6.5 - 8.1 g/dL 6.4(L) 6.3(L) 6.6  Total Bilirubin 0.3 - 1.2 mg/dL 0.4 0.5 0.5  Alkaline Phos 38 - 126 U/L 54 50 48  AST 15 - 41 U/L '22 23 20  ' ALT 0 - 44 U/L '9 10 13   ' I have reviewed her scans.     ASSESSMENT & PLAN:   Multiple myeloma ([place]) 1.  IgA lambda plasma cell myeloma, stage II, standard risk: -Bone marrow biopsy on 09/20/2018 shows plasma cell myeloma with 30% plasma cells.  FISH panel was normal.  Chromosome analysis was normal. -Labs and diagnosis on 08/29/2018 shows M spike 0.9 g.  Kappa light chains 148, lambda light chains 1132, ratio 0.13.  LDH normal.  Beta-2 microglobulin 13.5. -She had transfusion dependent anemia. -Velcade and dexamethasone started on 10/11/2018. -We reviewed myeloma panel from 05/23/2019.  M spike is negative.  Lambda light chains improved to 138.  Light chain ratio is 0.96.  Immunofixation is positive for IgA lambda monoclonal protein. -CBC shows hemoglobin 12.  Platelet count and white  cells are normal. -She is continuing to tolerate treatments very well.  She will continue with it.  I will reevaluate her in 4 weeks.  2.  ESRD on HD: -She was started on hemodialysis and undergoes on Monday, Wednesday and Friday.  She is tolerating it very well.  3.  Bone strengthening: -Bisphosphonates not started due to her poor renal function.  4.  Osteopenia: -Bone density on 05/16/2015 shows osteopenia.  We will plan to repeat the scan soon.  5.  Stage I IDC of the right breast, ER/PR positive, HER-2 negative: -She will continue anastrozole and is tolerating it very well.  Mild right upper extremity lymphedema stable.     Orders placed this encounter:  Orders Placed This Encounter  Procedures  . CBC with Differential  . Comprehensive metabolic panel  . Lactate dehydrogenase  . Protein electrophoresis, serum  . Kappa/lambda light chains  . Immunofixation electrophoresis     Derek Jack, MD [place] [phone]

## 2019-06-13 NOTE — Progress Notes (Signed)
1157 Labs reviewed with and pt seen by Dr. Delton Coombes and pt approved for Velcade injection today per MD

## 2019-06-13 NOTE — Patient Instructions (Addendum)
Center at [facility] Discharge Instructions  You were seen today by Dr. Delton Coombes. He went over your recent lab results. You will receive Velcade and Retacrit injections today.  He will see you back in 4 weeks for labs and follow up.   Thank you for choosing [place] [facility] to provide your oncology and hematology care.  To afford each patient quality time with our provider, please arrive at least 15 minutes before your scheduled appointment time.   If you have a lab appointment with the [facility] please come in thru the  Main Entrance and check in at the main information desk  You need to re-schedule your appointment should you arrive 10 or more minutes late.  We strive to give you quality time with our providers, and arriving late affects you and other patients whose appointments are after yours.  Also, if you no show three or more times for appointments you may be dismissed from the clinic at the providers discretion.     Again, thank you for choosing [facility].  Our hope is that these requests will decrease the amount of time that you wait before being seen by our physicians.       _____________________________________________________________  Should you have questions after your visit to [facility], please contact our office at [phone] between the hours of 8:00 a.m. and 4:30 p.m.  Voicemails left after 4:00 p.m. will not be returned until the following business day.  For prescription refill requests, have your pharmacy contact our office and allow 72 hours.    Cancer Center Support Programs:   > Cancer Support Group  2nd Tuesday of the month 1pm-2pm, Journey Room

## 2019-06-13 NOTE — Progress Notes (Signed)
Patient tolerated Velcade injection with no complaints voiced.  Lab work reviewed.  See MAR for details.  Injection site clean and dry with no bruising or swelling noted.  Band aid applied.  VSS.  Patient left by wheelchair with no s/s of distress noted.

## 2019-06-14 LAB — PROTEIN ELECTROPHORESIS, SERUM
A/G Ratio: 1.3 (ref 0.7–1.7)
Albumin ELP: 3.3 g/dL (ref 2.9–4.4)
Alpha-1-Globulin: 0.3 g/dL (ref 0.0–0.4)
Alpha-2-Globulin: 0.8 g/dL (ref 0.4–1.0)
Beta Globulin: 0.8 g/dL (ref 0.7–1.3)
Gamma Globulin: 0.7 g/dL (ref 0.4–1.8)
Globulin, Total: 2.6 g/dL (ref 2.2–3.9)
Total Protein ELP: 5.9 g/dL — ABNORMAL LOW (ref 6.0–8.5)

## 2019-06-14 LAB — KAPPA/LAMBDA LIGHT CHAINS
Kappa free light chain: 120 mg/L — ABNORMAL HIGH (ref 3.3–19.4)
Kappa, lambda light chain ratio: 0.85 (ref 0.26–1.65)
Lambda free light chains: 141.4 mg/L — ABNORMAL HIGH (ref 5.7–26.3)

## 2019-06-14 NOTE — Progress Notes (Signed)

## 2019-06-15 LAB — IMMUNOFIXATION ELECTROPHORESIS
IgA: 165 mg/dL (ref 64–422)
IgG (Immunoglobin G), Serum: 757 mg/dL (ref 586–1602)
IgM (Immunoglobulin M), Srm: 11 mg/dL — ABNORMAL LOW (ref 26–217)
Total Protein ELP: 5.8 g/dL — ABNORMAL LOW (ref 6.0–8.5)

## 2019-06-20 ENCOUNTER — Other Ambulatory Visit: Payer: Self-pay

## 2019-06-20 ENCOUNTER — Encounter (HOSPITAL_COMMUNITY): Payer: Self-pay

## 2019-06-20 ENCOUNTER — Inpatient Hospital Stay (HOSPITAL_COMMUNITY): Payer: Medicare PPO

## 2019-06-20 VITALS — BP 137/63 | HR 78 | Temp 96.9°F | Resp 18 | Wt 140.0 lb

## 2019-06-20 DIAGNOSIS — Z5112 Encounter for antineoplastic immunotherapy: Secondary | ICD-10-CM | POA: Diagnosis not present

## 2019-06-20 DIAGNOSIS — C9000 Multiple myeloma not having achieved remission: Secondary | ICD-10-CM

## 2019-06-20 LAB — CBC WITH DIFFERENTIAL/PLATELET
Abs Immature Granulocytes: 0.02 10*3/uL (ref 0.00–0.07)
Basophils Absolute: 0 10*3/uL (ref 0.0–0.1)
Basophils Relative: 1 %
Eosinophils Absolute: 0.2 10*3/uL (ref 0.0–0.5)
Eosinophils Relative: 3 %
HCT: 42.7 % (ref 36.0–46.0)
Hemoglobin: 13.1 g/dL (ref 12.0–15.0)
Immature Granulocytes: 0 %
Lymphocytes Relative: 24 %
Lymphs Abs: 1.3 10*3/uL (ref 0.7–4.0)
MCH: 33.2 pg (ref 26.0–34.0)
MCHC: 30.7 g/dL (ref 30.0–36.0)
MCV: 108.1 fL — ABNORMAL HIGH (ref 80.0–100.0)
Monocytes Absolute: 0.7 10*3/uL (ref 0.1–1.0)
Monocytes Relative: 12 %
Neutro Abs: 3.5 10*3/uL (ref 1.7–7.7)
Neutrophils Relative %: 60 %
Platelets: 231 10*3/uL (ref 150–400)
RBC: 3.95 MIL/uL (ref 3.87–5.11)
RDW: 17.9 % — ABNORMAL HIGH (ref 11.5–15.5)
WBC: 5.7 10*3/uL (ref 4.0–10.5)
nRBC: 0 % (ref 0.0–0.2)

## 2019-06-20 LAB — COMPREHENSIVE METABOLIC PANEL
ALT: 12 U/L (ref 0–44)
AST: 23 U/L (ref 15–41)
Albumin: 3.8 g/dL (ref 3.5–5.0)
Alkaline Phosphatase: 59 U/L (ref 38–126)
Anion gap: 10 (ref 5–15)
BUN: 19 mg/dL (ref 8–23)
CO2: 32 mmol/L (ref 22–32)
Calcium: 9.1 mg/dL (ref 8.9–10.3)
Chloride: 96 mmol/L — ABNORMAL LOW (ref 98–111)
Creatinine, Ser: 4.43 mg/dL — ABNORMAL HIGH (ref 0.44–1.00)
GFR calc Af Amer: 10 mL/min — ABNORMAL LOW (ref >60)
GFR calc non Af Amer: 9 mL/min — ABNORMAL LOW (ref >60)
Glucose, Bld: 91 mg/dL (ref 70–99)
Potassium: 3.9 mmol/L (ref 3.5–5.1)
Sodium: 138 mmol/L (ref 135–145)
Total Bilirubin: 0.4 mg/dL (ref 0.3–1.2)
Total Protein: 6.6 g/dL (ref 6.5–8.1)

## 2019-06-20 MED ORDER — PROCHLORPERAZINE MALEATE 10 MG PO TABS
ORAL_TABLET | ORAL | Status: AC
  Filled 2019-06-20: qty 1

## 2019-06-20 MED ORDER — DEXAMETHASONE 4 MG PO TABS
10.0000 mg | ORAL_TABLET | Freq: Once | ORAL | Status: AC
  Administered 2019-06-20: 10 mg via ORAL

## 2019-06-20 MED ORDER — DEXAMETHASONE 4 MG PO TABS
ORAL_TABLET | ORAL | Status: AC
  Filled 2019-06-20: qty 5

## 2019-06-20 MED ORDER — BORTEZOMIB CHEMO SQ INJECTION 3.5 MG (2.5MG/ML)
1.3000 mg/m2 | Freq: Once | INTRAMUSCULAR | Status: AC
  Administered 2019-06-20: 2.25 mg via SUBCUTANEOUS
  Filled 2019-06-20: qty 0.9

## 2019-06-20 MED ORDER — PROCHLORPERAZINE MALEATE 10 MG PO TABS
10.0000 mg | ORAL_TABLET | Freq: Once | ORAL | Status: AC
  Administered 2019-06-20: 10 mg via ORAL

## 2019-06-20 NOTE — Patient Instructions (Signed)
[facility] Discharge Instructions for Patients Receiving Chemotherapy   Beginning January 23rd 2017 lab work for the Cancer Center will be done in the  Main lab at [facility] on 1st floor. If you have a lab appointment with the Cancer Center please come in thru the  Main Entrance and check in at the main information desk   Today you received the following chemotherapy agents Velcade injection. Follow-up as scheduled. Call clinic for any questions or concerns  To help prevent nausea and vomiting after your treatment, we encourage you to take your nausea medication   If you develop nausea and vomiting, or diarrhea that is not controlled by your medication, call the clinic.  The clinic phone number is [phone]. Office hours are Monday-Friday 8:30am-5:00pm.  BELOW ARE SYMPTOMS THAT SHOULD BE REPORTED IMMEDIATELY:  *FEVER GREATER THAN 101.0 F  *CHILLS WITH OR WITHOUT FEVER  NAUSEA AND VOMITING THAT IS NOT CONTROLLED WITH YOUR NAUSEA MEDICATION  *UNUSUAL SHORTNESS OF BREATH  *UNUSUAL BRUISING OR BLEEDING  TENDERNESS IN MOUTH AND THROAT WITH OR WITHOUT PRESENCE OF ULCERS  *URINARY PROBLEMS  *BOWEL PROBLEMS  UNUSUAL RASH Items with * indicate a potential emergency and should be followed up as soon as possible. If you have an emergency after office hours please contact your primary care physician or go to the nearest emergency department.  Please call the clinic during office hours if you have any questions or concerns.   You may also contact the Patient Navigator at [phone] should you have any questions or need assistance in obtaining follow up care.      Resources For Cancer Patients and their Caregivers ? American Cancer Society: Can assist with transportation, wigs, general needs, runs Look Good Feel Better.        1-888-227-6333 ? Cancer Care: Provides financial assistance, online support groups, medication/co-pay assistance.   1-800-813-HOPE (4673) ? [facility] Assists [place] cancer patients and their families through emotional , educational and financial support.  336-427-4357 ? [place] DSS Where to apply for food stamps, Medicaid and utility assistance. 336-342-1394 ? [facility]: Transportation to medical appointments. 336-347-2287 ? Social Security Administration: May apply for disability if have a Stage IV cancer. 336-342-7796 1-800-772-1213 ? [place] Aging, Disability and Transit Services: Assists with nutrition, care and transit needs. 336-349-2343         

## 2019-06-20 NOTE — Progress Notes (Signed)
[place] reviewed with RLockamy NP and pt approved for Velcade injection today per NP                                                        Carly Wood tolerated Velcade injection well without complaints or incident. VSS Pt discharged via wheelchair in satisfactory condition accompanied by her sister

## 2019-06-27 ENCOUNTER — Other Ambulatory Visit: Payer: Self-pay

## 2019-06-27 ENCOUNTER — Inpatient Hospital Stay (HOSPITAL_COMMUNITY): Payer: Medicare PPO

## 2019-06-27 ENCOUNTER — Encounter (HOSPITAL_COMMUNITY): Payer: Self-pay

## 2019-06-27 VITALS — BP 109/60 | HR 80 | Temp 97.9°F | Resp 18 | Wt 138.7 lb

## 2019-06-27 DIAGNOSIS — Z5112 Encounter for antineoplastic immunotherapy: Secondary | ICD-10-CM | POA: Diagnosis not present

## 2019-06-27 DIAGNOSIS — C9000 Multiple myeloma not having achieved remission: Secondary | ICD-10-CM

## 2019-06-27 LAB — COMPREHENSIVE METABOLIC PANEL
ALT: 12 U/L (ref 0–44)
AST: 23 U/L (ref 15–41)
Albumin: 3.7 g/dL (ref 3.5–5.0)
Alkaline Phosphatase: 58 U/L (ref 38–126)
Anion gap: 11 (ref 5–15)
BUN: 21 mg/dL (ref 8–23)
CO2: 32 mmol/L (ref 22–32)
Calcium: 8.9 mg/dL (ref 8.9–10.3)
Chloride: 97 mmol/L — ABNORMAL LOW (ref 98–111)
Creatinine, Ser: 4.47 mg/dL — ABNORMAL HIGH (ref 0.44–1.00)
GFR calc Af Amer: 10 mL/min — ABNORMAL LOW (ref >60)
GFR calc non Af Amer: 9 mL/min — ABNORMAL LOW (ref >60)
Glucose, Bld: 214 mg/dL — ABNORMAL HIGH (ref 70–99)
Potassium: 3.8 mmol/L (ref 3.5–5.1)
Sodium: 140 mmol/L (ref 135–145)
Total Bilirubin: 0.3 mg/dL (ref 0.3–1.2)
Total Protein: 6.6 g/dL (ref 6.5–8.1)

## 2019-06-27 LAB — CBC WITH DIFFERENTIAL/PLATELET
Abs Immature Granulocytes: 0.01 10*3/uL (ref 0.00–0.07)
Basophils Absolute: 0.1 10*3/uL (ref 0.0–0.1)
Basophils Relative: 1 %
Eosinophils Absolute: 0.2 10*3/uL (ref 0.0–0.5)
Eosinophils Relative: 4 %
HCT: 43 % (ref 36.0–46.0)
Hemoglobin: 13.4 g/dL (ref 12.0–15.0)
Immature Granulocytes: 0 %
Lymphocytes Relative: 16 %
Lymphs Abs: 0.9 10*3/uL (ref 0.7–4.0)
MCH: 33.5 pg (ref 26.0–34.0)
MCHC: 31.2 g/dL (ref 30.0–36.0)
MCV: 107.5 fL — ABNORMAL HIGH (ref 80.0–100.0)
Monocytes Absolute: 0.4 10*3/uL (ref 0.1–1.0)
Monocytes Relative: 8 %
Neutro Abs: 4 10*3/uL (ref 1.7–7.7)
Neutrophils Relative %: 71 %
Platelets: 190 10*3/uL (ref 150–400)
RBC: 4 MIL/uL (ref 3.87–5.11)
RDW: 17.4 % — ABNORMAL HIGH (ref 11.5–15.5)
WBC: 5.6 10*3/uL (ref 4.0–10.5)
nRBC: 0 % (ref 0.0–0.2)

## 2019-06-27 MED ORDER — BORTEZOMIB CHEMO SQ INJECTION 3.5 MG (2.5MG/ML)
1.3000 mg/m2 | Freq: Once | INTRAMUSCULAR | Status: AC
  Administered 2019-06-27: 2.25 mg via SUBCUTANEOUS
  Filled 2019-06-27: qty 0.9

## 2019-06-27 MED ORDER — PROCHLORPERAZINE MALEATE 10 MG PO TABS
10.0000 mg | ORAL_TABLET | Freq: Once | ORAL | Status: AC
  Administered 2019-06-27: 12:00:00 10 mg via ORAL
  Filled 2019-06-27: qty 1

## 2019-06-27 MED ORDER — DEXAMETHASONE 4 MG PO TABS
10.0000 mg | ORAL_TABLET | Freq: Once | ORAL | Status: AC
  Administered 2019-06-27: 12:00:00 10 mg via ORAL
  Filled 2019-06-27: qty 3

## 2019-06-27 NOTE — Progress Notes (Signed)
Serum Creatinine 4.47 today.  Dr. Delton Coombes notified with no orders received.  Ok to treat today verbal order Dr. Delton Coombes.    Patient tolerated Velcade injection with no complaints voiced.  Lab work reviewed.  See MAR for details.  Injection site clean and dry with no bruising or swelling noted.  Band aid applied.  VSS.  Patient left ambulatory with no s/s of distress noted.

## 2019-06-28 MED ORDER — OCTREOTIDE ACETATE 30 MG IM KIT
PACK | INTRAMUSCULAR | Status: AC
  Filled 2019-06-28: qty 1

## 2019-06-30 ENCOUNTER — Other Ambulatory Visit (HOSPITAL_COMMUNITY): Payer: Self-pay | Admitting: Nurse Practitioner

## 2019-07-03 ENCOUNTER — Telehealth: Payer: Self-pay

## 2019-07-03 NOTE — Telephone Encounter (Signed)
Received referral form Dr. Theador Hawthorne requesting a fistulagram of L Upper fistula d/t prolonged bleeding post tx

## 2019-07-04 ENCOUNTER — Other Ambulatory Visit: Payer: Self-pay

## 2019-07-10 DIAGNOSIS — N186 End stage renal disease: Secondary | ICD-10-CM | POA: Diagnosis not present

## 2019-07-10 DIAGNOSIS — Z23 Encounter for immunization: Secondary | ICD-10-CM | POA: Diagnosis not present

## 2019-07-10 DIAGNOSIS — Z992 Dependence on renal dialysis: Secondary | ICD-10-CM | POA: Diagnosis not present

## 2019-07-11 ENCOUNTER — Inpatient Hospital Stay (HOSPITAL_COMMUNITY): Payer: Medicare PPO | Admitting: Hematology

## 2019-07-11 ENCOUNTER — Other Ambulatory Visit (HOSPITAL_COMMUNITY)
Admission: RE | Admit: 2019-07-11 | Discharge: 2019-07-11 | Disposition: A | Discharge status: Home / Self Care | Payer: Medicare PPO | Source: Ambulatory Visit | Attending: Vascular Surgery | Admitting: Vascular Surgery

## 2019-07-11 ENCOUNTER — Other Ambulatory Visit: Payer: Self-pay

## 2019-07-11 ENCOUNTER — Inpatient Hospital Stay (HOSPITAL_COMMUNITY): Payer: Medicare PPO

## 2019-07-11 ENCOUNTER — Inpatient Hospital Stay (HOSPITAL_COMMUNITY): Payer: Medicare PPO | Attending: Hematology

## 2019-07-11 DIAGNOSIS — R0602 Shortness of breath: Secondary | ICD-10-CM | POA: Insufficient documentation

## 2019-07-11 DIAGNOSIS — Z9011 Acquired absence of right breast and nipple: Secondary | ICD-10-CM | POA: Diagnosis not present

## 2019-07-11 DIAGNOSIS — Z9221 Personal history of antineoplastic chemotherapy: Secondary | ICD-10-CM | POA: Diagnosis not present

## 2019-07-11 DIAGNOSIS — Z20822 Contact with and (suspected) exposure to covid-19: Secondary | ICD-10-CM | POA: Diagnosis not present

## 2019-07-11 DIAGNOSIS — Z992 Dependence on renal dialysis: Secondary | ICD-10-CM | POA: Insufficient documentation

## 2019-07-11 DIAGNOSIS — I120 Hypertensive chronic kidney disease with stage 5 chronic kidney disease or end stage renal disease: Secondary | ICD-10-CM | POA: Diagnosis not present

## 2019-07-11 DIAGNOSIS — Z01812 Encounter for preprocedural laboratory examination: Secondary | ICD-10-CM | POA: Insufficient documentation

## 2019-07-11 DIAGNOSIS — G620 Drug-induced polyneuropathy: Secondary | ICD-10-CM | POA: Insufficient documentation

## 2019-07-11 DIAGNOSIS — C9000 Multiple myeloma not having achieved remission: Secondary | ICD-10-CM | POA: Insufficient documentation

## 2019-07-11 DIAGNOSIS — R600 Localized edema: Secondary | ICD-10-CM | POA: Insufficient documentation

## 2019-07-11 DIAGNOSIS — Z5111 Encounter for antineoplastic chemotherapy: Secondary | ICD-10-CM | POA: Diagnosis not present

## 2019-07-11 DIAGNOSIS — N186 End stage renal disease: Secondary | ICD-10-CM | POA: Diagnosis not present

## 2019-07-11 DIAGNOSIS — M8580 Other specified disorders of bone density and structure, unspecified site: Secondary | ICD-10-CM | POA: Diagnosis not present

## 2019-07-11 DIAGNOSIS — Z79811 Long term (current) use of aromatase inhibitors: Secondary | ICD-10-CM | POA: Insufficient documentation

## 2019-07-11 DIAGNOSIS — C50911 Malignant neoplasm of unspecified site of right female breast: Secondary | ICD-10-CM | POA: Diagnosis not present

## 2019-07-11 DIAGNOSIS — D630 Anemia in neoplastic disease: Secondary | ICD-10-CM | POA: Insufficient documentation

## 2019-07-11 LAB — CBC WITH DIFFERENTIAL/PLATELET
Abs Immature Granulocytes: 0 10*3/uL (ref 0.00–0.07)
Basophils Absolute: 0.1 10*3/uL (ref 0.0–0.1)
Basophils Relative: 1 %
Eosinophils Absolute: 0.2 10*3/uL (ref 0.0–0.5)
Eosinophils Relative: 6 %
HCT: 38.1 % (ref 36.0–46.0)
Hemoglobin: 11.9 g/dL — ABNORMAL LOW (ref 12.0–15.0)
Immature Granulocytes: 0 %
Lymphocytes Relative: 18 %
Lymphs Abs: 0.7 10*3/uL (ref 0.7–4.0)
MCH: 33 pg (ref 26.0–34.0)
MCHC: 31.2 g/dL (ref 30.0–36.0)
MCV: 105.5 fL — ABNORMAL HIGH (ref 80.0–100.0)
Monocytes Absolute: 0.3 10*3/uL (ref 0.1–1.0)
Monocytes Relative: 8 %
Neutro Abs: 2.8 10*3/uL (ref 1.7–7.7)
Neutrophils Relative %: 67 %
Platelets: 230 10*3/uL (ref 150–400)
RBC: 3.61 MIL/uL — ABNORMAL LOW (ref 3.87–5.11)
RDW: 16.3 % — ABNORMAL HIGH (ref 11.5–15.5)
WBC: 4.2 10*3/uL (ref 4.0–10.5)
nRBC: 0 % (ref 0.0–0.2)

## 2019-07-11 LAB — COMPREHENSIVE METABOLIC PANEL
ALT: 11 U/L (ref 0–44)
AST: 23 U/L (ref 15–41)
Albumin: 3.6 g/dL (ref 3.5–5.0)
Alkaline Phosphatase: 51 U/L (ref 38–126)
Anion gap: 12 (ref 5–15)
BUN: 22 mg/dL (ref 8–23)
CO2: 28 mmol/L (ref 22–32)
Calcium: 8.7 mg/dL — ABNORMAL LOW (ref 8.9–10.3)
Chloride: 96 mmol/L — ABNORMAL LOW (ref 98–111)
Creatinine, Ser: 4.26 mg/dL — ABNORMAL HIGH (ref 0.44–1.00)
GFR calc Af Amer: 11 mL/min — ABNORMAL LOW (ref >60)
GFR calc non Af Amer: 9 mL/min — ABNORMAL LOW (ref >60)
Glucose, Bld: 218 mg/dL — ABNORMAL HIGH (ref 70–99)
Potassium: 4.1 mmol/L (ref 3.5–5.1)
Sodium: 136 mmol/L (ref 135–145)
Total Bilirubin: 0.4 mg/dL (ref 0.3–1.2)
Total Protein: 6.4 g/dL — ABNORMAL LOW (ref 6.5–8.1)

## 2019-07-11 LAB — LACTATE DEHYDROGENASE: LDH: 138 U/L (ref 98–192)

## 2019-07-11 MED ORDER — DEXAMETHASONE 4 MG PO TABS
10.0000 mg | ORAL_TABLET | Freq: Once | ORAL | Status: AC
  Administered 2019-07-11: 10 mg via ORAL
  Filled 2019-07-11: qty 3

## 2019-07-11 MED ORDER — BORTEZOMIB CHEMO SQ INJECTION 3.5 MG (2.5MG/ML)
1.3000 mg/m2 | Freq: Once | INTRAMUSCULAR | Status: AC
  Administered 2019-07-11: 2.25 mg via SUBCUTANEOUS
  Filled 2019-07-11: qty 0.9

## 2019-07-11 MED ORDER — PROCHLORPERAZINE MALEATE 10 MG PO TABS
10.0000 mg | ORAL_TABLET | Freq: Once | ORAL | Status: AC
  Administered 2019-07-11: 10 mg via ORAL
  Filled 2019-07-11: qty 1

## 2019-07-11 NOTE — Progress Notes (Signed)
Serum creatinine 4.26 today.  Ok to treat verbal order Dr. Delton Coombes.   Patient tolerated Velcade injection with no complaints voiced.  Lab work reviewed.  See MAR for details.  Injection site clean and dry with no bruising or swelling noted.  Band aid applied.  VSS.  Patient left by wheelchair with no s/s of distress noted.

## 2019-07-11 NOTE — Progress Notes (Signed)
Carly Wood, [place] [address]   CLINIC:  Medical Oncology/Hematology  PCP:  Carly Wood, [place] [address] [phone]   REASON FOR VISIT:  Follow-up for plasma cell myeloma and anemia.  CURRENT THERAPY: Velcade, dexamethasone  BRIEF ONCOLOGIC HISTORY:  Oncology History  Stage 1 infiltrating ductal carcinoma of right female breast ([place])  09/12/2014 Mammogram   Mass in upper outer R breast, middle third depth appears slightly larger than it was on prior exam with more irreg spiculated margins   10/02/2014 Pathology Results   biopsy with invasive ductal carcinoma high grade 1.1 cm, dcis solid type. additional R breast tissue, excision, invasive ductal high grade 0.9 cm    10/24/2014 Pathology Results   no residual invasive carcinoma, DCIS, focal, atypical ductal hyperplasia, 0/7 LN positive for metastatic carcinoma ER > 90%, PR 30%, HER 2 2+   10/24/2014 Cancer Staging   T1cN0M0   10/24/2014 Surgery   R mastectomy, T1c, N0   12/28/2014 - 11/22/2015 Chemotherapy   Taxol/herceptin weekly X 12, Herceptin every 21 days, last due in September 2017   03/11/2015 -  Anti-estrogen oral therapy   Arimidex 1 mg daily   09/12/2015 Imaging   MUGA- The left ventricular ejection fraction equals 68%.   06/08/2016 Treatment Plan Change   Started Nerlynx   Multiple myeloma ([place])  10/04/2018 Initial Diagnosis   Multiple myeloma ([facility])   10/11/2018 -  Chemotherapy   The patient had bortezomib SQ (VELCADE) chemo injection 2.5 mg, 1.3 mg/m2 = 2.5 mg, Subcutaneous,  Once, 11 of 12 cycles Administration: 2.5 mg (10/11/2018), 2.5 mg (10/18/2018), 2.5 mg (10/25/2018), 2.5 mg (11/01/2018), 2.5 mg (11/08/2018), 2.5 mg (11/15/2018), 2.5 mg (11/22/2018), 2.5 mg (11/29/2018), 2.5 mg (12/06/2018), 2.5 mg (12/20/2018), 2.5 mg (12/27/2018), 2.5 mg (01/03/2019), 2.5 mg (01/17/2019), 2.5 mg (01/31/2019), 2.5 mg (02/14/2019), 2.5 mg (02/21/2019), 2.5 mg (02/28/2019), 2.5 mg  (03/14/2019), 2.5 mg (03/21/2019), 2.5 mg (03/28/2019), 2.5 mg (04/11/2019), 2.5 mg (04/18/2019), 2.5 mg (04/25/2019), 2.5 mg (05/16/2019), 2.5 mg (05/23/2019), 2.5 mg (05/30/2019), 2.25 mg (06/13/2019), 2.25 mg (06/20/2019), 2.25 mg (06/27/2019), 2.25 mg (07/11/2019)  for chemotherapy treatment.       CANCER STAGING: Cancer Staging Stage 1 infiltrating ductal carcinoma of right female breast [facility]) Staging form: Breast, AJCC 7th Edition - Clinical stage from 08/30/2015: Stage IA (T1c, N0, M0) - Signed by B[facility], PA-C on 08/30/2015    INTERVAL HISTORY:  Carly Wood 84 y.o. female seen for follow-up of multiple myeloma and toxicity assessment. Appetite is 100%. Energy levels are 75%. She had a ER visit 2 weeks ago as her dialysis fistula did not clot. No other bleeding reports. Numbness in the fingers is intermittent. Bilateral leg swelling is stable. Shortness of breath on exertion is also stable. Denies any nosebleeds or rectal bleeding.  REVIEW OF SYSTEMS:  Review of Systems  Respiratory: Positive for shortness of breath.   Cardiovascular: Positive for leg swelling.  Neurological: Positive for numbness.  Psychiatric/Behavioral: Positive for sleep disturbance.  All other systems reviewed and are negative.    PAST MEDICAL/SURGICAL HISTORY:  Past Medical History:  Diagnosis Date  . Anemia   . Anxiety   . Chronic kidney disease   . Chronic renal disease, stage 4, severely decreased glomerular filtration rate (GFR) between 15-29 mL/min/1.73 square meter (HCC) 08/22/2015  . Complication of anesthesia    delirious after Breast Surgery  . Dementia ([place])    mild  . Depression   .  Diabetes mellitus without complication ([place])    type II  . Dyspnea    with activity  . GERD (gastroesophageal reflux disease)   . Glaucoma   . Hypertension   . Pneumonia   . Stage 1 infiltrating ductal carcinoma of right female breast ([place]) 08/21/2015   ER+ PR+ HER 2 neu + (3+) T1cN0    Past Surgical History:   Procedure Laterality Date  . AV FISTULA PLACEMENT Left 11/22/2017   Procedure: ARTERIOVENOUS (AV) FISTULA CREATION LEFT ARM;  Surgeon: Elam Dutch, MD;  Location: [facility];  Service: Vascular;  Laterality: Left;  . BIOPSY  08/07/2016   Procedure: BIOPSY;  Surgeon: Daneil Dolin, MD;  [facility];  Service: Endoscopy;;  gastric ulcer biopsy  . COLONOSCOPY    . ESOPHAGOGASTRODUODENOSCOPY N/A 08/07/2016   Procedure: ESOPHAGOGASTRODUODENOSCOPY (EGD);  Surgeon: Daneil Dolin, MD;  [facility];  Service: Endoscopy;  Laterality: N/A;  1215-rescheduled to 6/1 @ 2:30pm per Tretha Sciara  . ESOPHAGOGASTRODUODENOSCOPY N/A 11/27/2016   Procedure: ESOPHAGOGASTRODUODENOSCOPY (EGD);  Surgeon: Daneil Dolin, MD;  [facility];  Service: Endoscopy;  Laterality: N/A;  8:15am  . FISTULA SUPERFICIALIZATION Left 02/14/2018   Procedure: FISTULA SUPERFICIALIZATION LEFT ARM;  Surgeon: Angelia Mould, MD;  Location: [facility];  Service: Vascular;  Laterality: Left;  . FRACTURE SURGERY Right    ankle  . MALONEY DILATION N/A 08/07/2016   Procedure: Venia Minks DILATION;  Surgeon: Daneil Dolin, MD;  [facility];  Service: Endoscopy;  Laterality: N/A;  . MASTECTOMY, PARTIAL Right   . PERIPHERAL VASCULAR BALLOON ANGIOPLASTY Left 07/13/2019   Procedure: PERIPHERAL VASCULAR BALLOON ANGIOPLASTY;  Surgeon: Marty Heck, MD;  Location: [facility];  Service: Cardiovascular;  Laterality: Left;  arm fistulogram  . PORT-A-CATH REMOVAL Left 11/22/2017   Procedure: REMOVAL PORT-A-CATH LEFT CHEST;  Surgeon: Elam Dutch, MD;  [facility];  Service: Vascular;  Laterality: Left;  . RETINAL DETACHMENT SURGERY Right      SOCIAL HISTORY:  Social History   Socioeconomic History  . Marital status: Single    Spouse name: Not on file  . Number of children: Not on file  . Years of education: Not on file  . Highest education level: Not on file  Occupational History   . Not on file  Tobacco Use  . Smoking status: Never Smoker  . Smokeless tobacco: Never Used  Substance and Sexual Activity  . Alcohol use: No    Alcohol/week: 0.0 standard drinks  . Drug use: No  . Sexual activity: Not on file  Other Topics Concern  . Not on file  Social History Narrative  . Not on file   Social Determinants of Health   Financial Resource Strain:   . Difficulty of Paying Living Expenses:   Food Insecurity:   . Worried About Charity fundraiser in the Last Year:   . Arboriculturist in the Last Year:   Transportation Needs:   . Film/video editor (Medical):   Marland Kitchen Lack of Transportation (Non-Medical):   Physical Activity:   . Days of Exercise per Week:   . Minutes of Exercise per Session:   Stress:   . Feeling of Stress :   Social Connections:   . Frequency of Communication with Friends and Family:   . Frequency of Social Gatherings with Friends and Family:   . Attends Religious Services:   . Active Member of Clubs or Organizations:   .  Attends Archivist Meetings:   Marland Kitchen Marital Status:   Intimate Partner Violence:   . Fear of Current or Ex-Partner:   . Emotionally Abused:   Marland Kitchen Physically Abused:   . Sexually Abused:     FAMILY HISTORY:  Family History  Problem Relation Age of Onset  . Multiple myeloma Sister   . Brain cancer Sister   . Dementia Mother        died at 48  . Stroke Mother   . Heart failure Mother   . Diabetes Mother   . Heart disease Father   . Prostate cancer Brother   . Colon cancer Neg Hx     CURRENT MEDICATIONS:  Outpatient Encounter Medications as of 07/11/2019  Medication Sig Note  . ACCU-CHEK AVIVA PLUS test strip    . acetaminophen (TYLENOL) 500 MG tablet Take 500 mg by mouth every 6 (six) hours as needed for moderate pain or headache.    Marland Kitchen acyclovir (ZOVIRAX) 200 MG capsule TAKE 1 CAPSULE BY MOUTH TWICE DAILY (Patient taking differently: Take 200 mg by mouth 2 (two) times daily. )   . Alcohol Swabs (ALCOHOL  PREP) 70 % PADS See admin instructions.   Marland Kitchen amLODipine (NORVASC) 10 MG tablet Take 10 mg by mouth daily.   Marland Kitchen anastrozole (ARIMIDEX) 1 MG tablet TAKE 1 TABLET BY MOUTH DAILY (Patient taking differently: Take 1 mg by mouth daily. )   . AQUALANCE LANCETS 30G MISC USE TO test twice daily   . aspirin EC 81 MG tablet Take 81 mg by mouth daily.   . bortezomib IV (VELCADE) 3.5 MG injection Inject into the vein once a week. Weekly every 21 days 07/04/2019: Administered at [facility]  . calcitRIOL (ROCALTROL) 0.25 MCG capsule Take 0.25 mcg by mouth daily at 12 noon.    . Calcium Carbonate-Vitamin D (CALCIUM 600+D PO) Take 1 tablet by mouth daily.   . cloNIDine (CATAPRES) 0.3 MG tablet Take 0.3 mg by mouth 2 (two) times daily.    . diphenhydrAMINE (BENADRYL) 25 MG tablet Take 25 mg by mouth every 6 (six) hours as needed for allergies.    . feeding supplement, GLUCERNA SHAKE, (GLUCERNA SHAKE) LIQD Take 237 mLs by mouth daily.   Marland Kitchen GLOBAL EASE INJECT PEN NEEDLES 31G X 8 MM MISC USE EVERY DAY AS DIRECTED   . Lancet Devices (ADJUSTABLE LANCING DEVICE) MISC TO check blood glucose   . lidocaine-prilocaine (EMLA) cream Apply 1 application topically daily as needed (port access).    . Multiple Vitamin (MULTIVITAMIN) capsule Take 1 capsule by mouth daily.   . ONGLYZA 5 MG TABS tablet Take 5 mg by mouth daily.   Marland Kitchen PARoxetine (PAXIL) 20 MG tablet Take 20 mg by mouth daily.    . pioglitazone (ACTOS) 30 MG tablet Take 15 mg by mouth daily.    . pravastatin (PRAVACHOL) 20 MG tablet Take 20 mg by mouth every evening.    . prochlorperazine (COMPAZINE) 10 MG tablet Take 10 mg by mouth every 6 (six) hours as needed for nausea or vomiting.    . sodium chloride (OCEAN) 0.65 % SOLN nasal spray Place 1 spray into both nostrils as needed for congestion.   Nelva Nay SOLOSTAR 300 UNIT/ML SOPN Inject 5 Units into the skin every evening.    . traZODone (DESYREL) 100 MG tablet Take 100 mg by mouth at bedtime.    No  facility-administered encounter medications on file as of 07/11/2019.    ALLERGIES:  Allergies  Allergen Reactions  . Penicillins Other (See Comments)    Unsure of reaction Has patient had a PCN reaction causing immediate rash, facial/tongue/throat swelling, SOB or lightheadedness with hypotension: Unknown Has patient had a PCN reaction causing severe rash involving mucus membranes or skin necrosis: Unknown Has patient had a PCN reaction that required hospitalization: No Has patient had a PCN reaction occurring within the last 10 years: Unknown If all of the above answers are "NO", then may proceed with Cephalosporin use.    . Ace Inhibitors Cough and Other (See Comments)    Tongue swell  . Lisinopril Other (See Comments)    Tongue swell     PHYSICAL EXAM:  ECOG Performance status: 2  Vitals:   07/11/19 1005  BP: (!) 97/49  Pulse: 66  Resp: 18  Temp: (!) 96.9 F (36.1 C)  SpO2: 100%   Filed Weights   07/11/19 1005  Weight: 141 lb 3.2 oz (64 kg)    Physical Exam Constitutional:      Appearance: Normal appearance.  Cardiovascular:     Rate and Rhythm: Normal rate and regular rhythm.     Pulses: Normal pulses.     Heart sounds: Normal heart sounds.  Pulmonary:     Effort: Pulmonary effort is normal.     Breath sounds: Normal breath sounds.  Abdominal:     General: There is no distension.     Palpations: Abdomen is soft. There is no mass.  Musculoskeletal:     Cervical back: Normal range of motion.     Right lower leg: No edema.     Left lower leg: No edema.  Skin:    General: Skin is warm.  Neurological:     General: No focal deficit present.     Mental Status: She is alert and oriented to person, place, and time.  Psychiatric:        Mood and Affect: Mood normal.        Behavior: Behavior normal.        Thought Content: Thought content normal.        Judgment: Judgment normal.      LABORATORY DATA:  I have reviewed the labs as listed.  CBC     Component Value Date/Time   WBC 4.4 07/18/2019 0928   RBC 3.50 (L) 07/18/2019 0928   HGB 11.5 (L) 07/18/2019 0928   HCT 36.5 07/18/2019 0928   PLT 180 07/18/2019 0928   MCV 104.3 (H) 07/18/2019 0928   MCH 32.9 07/18/2019 0928   MCHC 31.5 07/18/2019 0928   RDW 15.6 (H) 07/18/2019 0928   LYMPHSABS 0.9 07/18/2019 0928   MONOABS 0.4 07/18/2019 0928   EOSABS 0.2 07/18/2019 0928   BASOSABS 0.0 07/18/2019 0928   CMP Latest Ref Rng & Units 07/18/2019 07/13/2019 07/11/2019  Glucose 70 - 99 mg/dL 215(H) 95 218(H)  BUN 8 - 23 mg/dL 20 30(H) 22  Creatinine 0.44 - 1.00 mg/dL 3.99(H) 4.30(H) 4.26(H)  Sodium 135 - 145 mmol/L 135 137 136  Potassium 3.5 - 5.1 mmol/L 3.9 4.7 4.1  Chloride 98 - 111 mmol/L 95(L) 99 96(L)  CO2 22 - 32 mmol/L 30 - 28  Calcium 8.9 - 10.3 mg/dL 8.7(L) - 8.7(L)  Total Protein 6.5 - 8.1 g/dL 6.2(L) - 6.4(L)  Total Bilirubin 0.3 - 1.2 mg/dL 0.3 - 0.4  Alkaline Phos 38 - 126 U/L 54 - 51  AST 15 - 41 U/L 24 - 23  ALT 0 - 44 U/L 10 - 11  I have reviewed her scans.     ASSESSMENT & PLAN:   Multiple myeloma ([place]) 1. IgA lambda plasma cell myeloma, stage II, standard risk: -BMBX on 09/20/2018 shows plasma cell myeloma, 30% plasma cells. FISH panel was normal. Chromosome analysis normal. -Labs at diagnosis on 08/29/2018 with M spike 0.9 g. Kappa light chains 148, lambda light chains 1132, ratio 0.13. LDH normal. Beta-2 microglobulin 13.5. She had transfusion dependent anemia. -Velcade and dexamethasone started on 10/11/2018. -We reviewed myeloma panel from 06/13/2018 which showed M spike is undetectable. Kappa light chains are 120, lambda light chains 141, ratio 0.85. -CBC was grossly normal. She will proceed with her treatment. I will reevaluate her in 4 weeks.  2. ESRD on HD: -Started on hemodialysis and is continuing on Monday, Wednesday and Friday. Tolerating well.  3. Bone strengthening: -Bisphosphonates not started due to poor renal function.  4. Osteopenia: -Bone  density on 05/16/2015 shows osteopenia. We will plan to repeat scans soon.  5. Stage I IDC of the right breast, ER/PR positive, HER-2 negative: -She will continue anastrozole and tolerating well. Mild right upper extremity lymphedema stable.     Orders placed this encounter:  No orders of the defined types were placed in this encounter.    Derek Jack, MD [place] [phone]

## 2019-07-11 NOTE — Patient Instructions (Signed)
[facility] at [facility] Discharge Instructions  You were seen today by Dr. Katragadda. He went over your recent lab results. He will see you back in 4 weeks for labs and follow up.   Thank you for choosing [facility] at [facility] to provide your oncology and hematology care.  To afford each patient quality time with our provider, please arrive at least 15 minutes before your scheduled appointment time.   If you have a lab appointment with the Cancer Center please come in thru the  Main Entrance and check in at the main information desk  You need to re-schedule your appointment should you arrive 10 or more minutes late.  We strive to give you quality time with our providers, and arriving late affects you and other patients whose appointments are after yours.  Also, if you no show three or more times for appointments you may be dismissed from the clinic at the providers discretion.     Again, thank you for choosing [facility].  Our hope is that these requests will decrease the amount of time that you wait before being seen by our physicians.       _____________________________________________________________  Should you have questions after your visit to [facility], please contact our office at [phone] between the hours of 8:00 a.m. and 4:30 p.m.  Voicemails left after 4:00 p.m. will not be returned until the following business day.  For prescription refill requests, have your pharmacy contact our office and allow 72 hours.    Cancer Center Support Programs:   > Cancer Support Group  2nd Tuesday of the month 1pm-2pm, Journey Room    

## 2019-07-12 DIAGNOSIS — N186 End stage renal disease: Secondary | ICD-10-CM | POA: Diagnosis not present

## 2019-07-12 DIAGNOSIS — Z992 Dependence on renal dialysis: Secondary | ICD-10-CM | POA: Diagnosis not present

## 2019-07-12 DIAGNOSIS — Z23 Encounter for immunization: Secondary | ICD-10-CM | POA: Diagnosis not present

## 2019-07-12 LAB — KAPPA/LAMBDA LIGHT CHAINS
Kappa free light chain: 140.3 mg/L — ABNORMAL HIGH (ref 3.3–19.4)
Kappa, lambda light chain ratio: 1.09 (ref 0.26–1.65)
Lambda free light chains: 128.8 mg/L — ABNORMAL HIGH (ref 5.7–26.3)

## 2019-07-12 LAB — SARS CORONAVIRUS 2 (TAT 6-24 HRS): SARS Coronavirus 2: NEGATIVE

## 2019-07-13 ENCOUNTER — Ambulatory Visit (HOSPITAL_COMMUNITY)
Admission: RE | Admit: 2019-07-13 | Discharge: 2019-07-13 | Disposition: A | Discharge status: Home / Self Care | Payer: Medicare PPO | Attending: Vascular Surgery | Admitting: Vascular Surgery

## 2019-07-13 ENCOUNTER — Encounter (HOSPITAL_COMMUNITY)
Admission: RE | Disposition: A | Discharge status: Home / Self Care | Payer: Self-pay | Source: Home / Self Care | Attending: Vascular Surgery

## 2019-07-13 ENCOUNTER — Ambulatory Visit: Admit: 2019-07-13 | Payer: Medicare PPO | Admitting: Vascular Surgery

## 2019-07-13 ENCOUNTER — Other Ambulatory Visit: Payer: Self-pay

## 2019-07-13 DIAGNOSIS — E1122 Type 2 diabetes mellitus with diabetic chronic kidney disease: Secondary | ICD-10-CM | POA: Insufficient documentation

## 2019-07-13 DIAGNOSIS — Z8249 Family history of ischemic heart disease and other diseases of the circulatory system: Secondary | ICD-10-CM | POA: Diagnosis not present

## 2019-07-13 DIAGNOSIS — Z7982 Long term (current) use of aspirin: Secondary | ICD-10-CM | POA: Insufficient documentation

## 2019-07-13 DIAGNOSIS — Z992 Dependence on renal dialysis: Secondary | ICD-10-CM | POA: Diagnosis not present

## 2019-07-13 DIAGNOSIS — Z79899 Other long term (current) drug therapy: Secondary | ICD-10-CM | POA: Insufficient documentation

## 2019-07-13 DIAGNOSIS — Z794 Long term (current) use of insulin: Secondary | ICD-10-CM | POA: Insufficient documentation

## 2019-07-13 DIAGNOSIS — I120 Hypertensive chronic kidney disease with stage 5 chronic kidney disease or end stage renal disease: Secondary | ICD-10-CM | POA: Diagnosis not present

## 2019-07-13 DIAGNOSIS — N186 End stage renal disease: Secondary | ICD-10-CM | POA: Diagnosis not present

## 2019-07-13 DIAGNOSIS — Z880 Allergy status to penicillin: Secondary | ICD-10-CM | POA: Diagnosis not present

## 2019-07-13 DIAGNOSIS — F0390 Unspecified dementia without behavioral disturbance: Secondary | ICD-10-CM | POA: Diagnosis not present

## 2019-07-13 DIAGNOSIS — K219 Gastro-esophageal reflux disease without esophagitis: Secondary | ICD-10-CM | POA: Diagnosis not present

## 2019-07-13 DIAGNOSIS — F329 Major depressive disorder, single episode, unspecified: Secondary | ICD-10-CM | POA: Diagnosis not present

## 2019-07-13 DIAGNOSIS — Y841 Kidney dialysis as the cause of abnormal reaction of the patient, or of later complication, without mention of misadventure at the time of the procedure: Secondary | ICD-10-CM | POA: Diagnosis not present

## 2019-07-13 DIAGNOSIS — T82898A Other specified complication of vascular prosthetic devices, implants and grafts, initial encounter: Secondary | ICD-10-CM | POA: Diagnosis not present

## 2019-07-13 DIAGNOSIS — T82858A Stenosis of vascular prosthetic devices, implants and grafts, initial encounter: Secondary | ICD-10-CM | POA: Diagnosis not present

## 2019-07-13 DIAGNOSIS — Z833 Family history of diabetes mellitus: Secondary | ICD-10-CM | POA: Diagnosis not present

## 2019-07-13 DIAGNOSIS — F419 Anxiety disorder, unspecified: Secondary | ICD-10-CM | POA: Insufficient documentation

## 2019-07-13 DIAGNOSIS — Z888 Allergy status to other drugs, medicaments and biological substances status: Secondary | ICD-10-CM | POA: Diagnosis not present

## 2019-07-13 DIAGNOSIS — Z853 Personal history of malignant neoplasm of breast: Secondary | ICD-10-CM | POA: Diagnosis not present

## 2019-07-13 HISTORY — PX: PERIPHERAL VASCULAR BALLOON ANGIOPLASTY: CATH118281

## 2019-07-13 LAB — PROTEIN ELECTROPHORESIS, SERUM
A/G Ratio: 1.2 (ref 0.7–1.7)
Albumin ELP: 3.2 g/dL (ref 2.9–4.4)
Alpha-1-Globulin: 0.2 g/dL (ref 0.0–0.4)
Alpha-2-Globulin: 0.9 g/dL (ref 0.4–1.0)
Beta Globulin: 0.9 g/dL (ref 0.7–1.3)
Gamma Globulin: 0.8 g/dL (ref 0.4–1.8)
Globulin, Total: 2.7 g/dL (ref 2.2–3.9)
Total Protein ELP: 5.9 g/dL — ABNORMAL LOW (ref 6.0–8.5)

## 2019-07-13 LAB — POCT I-STAT, CHEM 8
BUN: 30 mg/dL — ABNORMAL HIGH (ref 8–23)
Calcium, Ion: 1.15 mmol/L (ref 1.15–1.40)
Chloride: 99 mmol/L (ref 98–111)
Creatinine, Ser: 4.3 mg/dL — ABNORMAL HIGH (ref 0.44–1.00)
Glucose, Bld: 95 mg/dL (ref 70–99)
HCT: 39 % (ref 36.0–46.0)
Hemoglobin: 13.3 g/dL (ref 12.0–15.0)
Potassium: 4.7 mmol/L (ref 3.5–5.1)
Sodium: 137 mmol/L (ref 135–145)
TCO2: 34 mmol/L — ABNORMAL HIGH (ref 22–32)

## 2019-07-13 SURGERY — FISTULA SUPERFICIALIZATION
Anesthesia: Monitor Anesthesia Care | Laterality: Left

## 2019-07-13 SURGERY — PERIPHERAL VASCULAR BALLOON ANGIOPLASTY
Anesthesia: LOCAL | Laterality: Left

## 2019-07-13 MED ORDER — SODIUM CHLORIDE 0.9% FLUSH: 3.0000 mL | Freq: Two times a day (BID) | INTRAVENOUS | Status: DC

## 2019-07-13 MED ORDER — HEPARIN SODIUM (PORCINE) 1000 UNIT/ML IJ SOLN
INTRAMUSCULAR | Status: DC | PRN
  Administered 2019-07-13: 3000 [IU] via INTRAVENOUS

## 2019-07-13 MED ORDER — HEPARIN (PORCINE) IN NACL 1000-0.9 UT/500ML-% IV SOLN
INTRAVENOUS | Status: DC | PRN
  Administered 2019-07-13: 500 mL

## 2019-07-13 MED ORDER — SODIUM CHLORIDE 0.9% FLUSH: 3.0000 mL | INTRAVENOUS | Status: DC | PRN

## 2019-07-13 MED ORDER — LIDOCAINE HCL (PF) 1 % IJ SOLN
INTRAMUSCULAR | Status: AC
  Filled 2019-07-13: qty 30

## 2019-07-13 MED ORDER — HEPARIN SODIUM (PORCINE) 1000 UNIT/ML IJ SOLN
INTRAMUSCULAR | Status: AC
  Filled 2019-07-13: qty 1

## 2019-07-13 MED ORDER — LIDOCAINE HCL (PF) 1 % IJ SOLN
INTRAMUSCULAR | Status: DC | PRN
  Administered 2019-07-13: 2 mL via INTRADERMAL

## 2019-07-13 MED ORDER — SODIUM CHLORIDE 0.9 % IV SOLN: 250.0000 mL | INTRAVENOUS | Status: DC | PRN

## 2019-07-13 MED ORDER — IODIXANOL 320 MG/ML IV SOLN
INTRAVENOUS | Status: DC | PRN
  Administered 2019-07-13: 75 mL via INTRAVENOUS

## 2019-07-13 MED ORDER — HEPARIN (PORCINE) IN NACL 1000-0.9 UT/500ML-% IV SOLN
INTRAVENOUS | Status: AC
  Filled 2019-07-13: qty 500

## 2019-07-13 SURGICAL SUPPLY — 18 items
BAG SNAP BAND KOVER 36X36 (MISCELLANEOUS) ×3 IMPLANT
BALLN MUSTANG 6X60X75 (BALLOONS) ×3
BALLOON MUSTANG 6X60X75 (BALLOONS) ×1 IMPLANT
CATH BEACON 5 .035 65 KMP TIP (CATHETERS) ×2 IMPLANT
COVER DOME SNAP 22 D (MISCELLANEOUS) ×5 IMPLANT
DCB RANGER 6.0X150 150 (BALLOONS) ×1 IMPLANT
KIT ENCORE 26 ADVANTAGE (KITS) ×2 IMPLANT
KIT MICROPUNCTURE NIT STIFF (SHEATH) ×2 IMPLANT
PROTECTION STATION PRESSURIZED (MISCELLANEOUS) ×3
RANGER DCB 6.0X150 150 (BALLOONS) ×3
SHEATH PINNACLE R/O II 6F 4CM (SHEATH) ×2 IMPLANT
SHEATH PROBE COVER 6X72 (BAG) ×3 IMPLANT
STATION PROTECTION PRESSURIZED (MISCELLANEOUS) ×2 IMPLANT
STOPCOCK MORSE 400PSI 3WAY (MISCELLANEOUS) ×3 IMPLANT
TRAY PV CATH (CUSTOM PROCEDURE TRAY) ×3 IMPLANT
TUBING CIL FLEX 10 FLL-RA (TUBING) ×3 IMPLANT
WIRE BENTSON .035X145CM (WIRE) ×2 IMPLANT
WIRE SPARTACORE .014X300CM (WIRE) ×2 IMPLANT

## 2019-07-13 NOTE — Discharge Instructions (Signed)

## 2019-07-13 NOTE — H&P (Signed)
H&P    MRN #:  [id number]  History of Present Illness: This is a 84 y.o. female with end-stage renal disease that presents for planned left arm fistulogram.  Patient states her arm fistula is working fine.  She seems somewhat unclear as to why she is here today.  She is not sure how long the fistula has been placed..  Her left arm brachiocephalic fistula was placed on 02/14/2018 by Dr. Scot Dock.  Past Medical History:  Diagnosis Date  . Anemia   . Anxiety   . Chronic kidney disease   . Chronic renal disease, stage 4, severely decreased glomerular filtration rate (GFR) between 15-29 mL/min/1.73 square meter (HCC) 08/22/2015  . Complication of anesthesia    delirious after Breast Surgery  . Dementia ([place])    mild  . Depression   . Diabetes mellitus without complication ([place])    type II  . Dyspnea    with activity  . GERD (gastroesophageal reflux disease)   . Glaucoma   . Hypertension   . Pneumonia   . Stage 1 infiltrating ductal carcinoma of right female breast ([place]) 08/21/2015   ER+ PR+ HER 2 neu + (3+) T1cN0     Past Surgical History:  Procedure Laterality Date  . AV FISTULA PLACEMENT Left 11/22/2017   Procedure: ARTERIOVENOUS (AV) FISTULA CREATION LEFT ARM;  Surgeon: Elam Dutch, MD;  Location: [place];  Service: Vascular;  Laterality: Left;  . BIOPSY  08/07/2016   Procedure: BIOPSY;  Surgeon: Daneil Dolin, MD;  [facility];  Service: Endoscopy;;  gastric ulcer biopsy  . COLONOSCOPY    . ESOPHAGOGASTRODUODENOSCOPY N/A 08/07/2016   Procedure: ESOPHAGOGASTRODUODENOSCOPY (EGD);  Surgeon: Daneil Dolin, MD;  [facility];  Service: Endoscopy;  Laterality: N/A;  1215-rescheduled to 6/1 @ 2:30pm per Tretha Sciara  . ESOPHAGOGASTRODUODENOSCOPY N/A 11/27/2016   Procedure: ESOPHAGOGASTRODUODENOSCOPY (EGD);  Surgeon: Daneil Dolin, MD;  [facility];  Service: Endoscopy;  Laterality: N/A;  8:15am  . FISTULA SUPERFICIALIZATION Left 02/14/2018   Procedure:  FISTULA SUPERFICIALIZATION LEFT ARM;  Surgeon: Angelia Mould, MD;  Location: [place];  Service: Vascular;  Laterality: Left;  . FRACTURE SURGERY Right    ankle  . MALONEY DILATION N/A 08/07/2016   Procedure: Venia Minks DILATION;  Surgeon: Daneil Dolin, MD;  [facility];  Service: Endoscopy;  Laterality: N/A;  . MASTECTOMY, PARTIAL Right   . PORT-A-CATH REMOVAL Left 11/22/2017   Procedure: REMOVAL PORT-A-CATH LEFT CHEST;  Surgeon: Elam Dutch, MD;  Location: [facility] OR;  Service: Vascular;  Laterality: Left;  . RETINAL DETACHMENT SURGERY Right     Allergies  Allergen Reactions  . Penicillins Other (See Comments)    Unsure of reaction Has patient had a PCN reaction causing immediate rash, facial/tongue/throat swelling, SOB or lightheadedness with hypotension: Unknown Has patient had a PCN reaction causing severe rash involving mucus membranes or skin necrosis: Unknown Has patient had a PCN reaction that required hospitalization: No Has patient had a PCN reaction occurring within the last 10 years: Unknown If all of the above answers are "NO", then may proceed with Cephalosporin use.    . Ace Inhibitors Cough and Other (See Comments)    Tongue swell  . Lisinopril Other (See Comments)    Tongue swell    Prior to Admission medications   Medication Sig Start Date End Date Taking? Authorizing Provider  acetaminophen (TYLENOL) 500 MG tablet Take 500 mg by mouth every 6 (six) hours  as needed for moderate pain or headache.    Yes [provider]  acyclovir (ZOVIRAX) 200 MG capsule TAKE 1 CAPSULE BY MOUTH TWICE DAILY Patient taking differently: Take 200 mg by mouth 2 (two) times daily.  06/30/19  Yes Lockamy, Randi L, NP-C  amLODipine (NORVASC) 10 MG tablet Take 10 mg by mouth daily. 01/25/18  Yes [provider]  anastrozole (ARIMIDEX) 1 MG tablet TAKE 1 TABLET BY MOUTH DAILY Patient taking differently: Take 1 mg by mouth daily.  04/10/19  Yes Derek Jack, MD  aspirin EC 81 MG tablet Take 81 mg by mouth daily.   Yes [provider]  calcitRIOL (ROCALTROL) 0.25 MCG capsule Take 0.25 mcg by mouth daily at 12 noon.    Yes [provider]  Calcium Carbonate-Vitamin D (CALCIUM 600+D PO) Take 1 tablet by mouth daily.   Yes [provider]  cloNIDine (CATAPRES) 0.3 MG tablet Take 0.3 mg by mouth 2 (two) times daily.    Yes [provider]  diphenhydrAMINE (BENADRYL) 25 MG tablet Take 25 mg by mouth every 6 (six) hours as needed for allergies.    Yes [provider]  feeding supplement, GLUCERNA SHAKE, (GLUCERNA SHAKE) LIQD Take 237 mLs by mouth daily.   Yes [provider]  lidocaine-prilocaine (EMLA) cream Apply 1 application topically daily as needed (port access).  04/19/19  Yes [provider]  Multiple Vitamin (MULTIVITAMIN) capsule Take 1 capsule by mouth daily.   Yes [provider]  ONGLYZA 5 MG TABS tablet Take 5 mg by mouth daily. 08/28/17  Yes [provider]  PARoxetine (PAXIL) 20 MG tablet Take 20 mg by mouth daily.    Yes [provider]  pioglitazone (ACTOS) 30 MG tablet Take 15 mg by mouth daily.  08/09/15  Yes [provider]  pravastatin (PRAVACHOL) 20 MG tablet Take 20 mg by mouth every evening.  08/09/15  Yes [provider]  prochlorperazine (COMPAZINE) 10 MG tablet Take 10 mg by mouth every 6 (six) hours as needed for nausea or vomiting.    Yes [provider]  sodium chloride (OCEAN) 0.65 % SOLN nasal spray Place 1 spray into both nostrils as needed for congestion.   Yes [provider]  TOUJEO SOLOSTAR 300 UNIT/ML SOPN Inject 5 Units into the skin every evening.  02/24/18  Yes [provider]  traZODone (DESYREL) 100 MG tablet Take 100 mg by mouth at bedtime. 11/30/17  Yes [provider]  [place] test strip  04/26/18   [provider]  Alcohol Swabs (ALCOHOL PREP) 70  % PADS See admin instructions. 08/31/17   [provider]  AQUALANCE LANCETS 30G MISC USE TO test twice daily 08/30/17   [provider]  bortezomib IV (VELCADE) 3.5 MG injection Inject into the vein once a week. Weekly every 21 days 10/11/18   [provider]  [place] X 8 MM [place] USE EVERY DAY AS DIRECTED 01/03/19   [provider]  Lancet Devices (ADJUSTABLE LANCING DEVICE) [place] TO check blood glucose 08/02/17   [provider]    Social History   Socioeconomic History  . Marital status: Single    Spouse name: Not on file  . Number of children: Not on file  . Years of education: Not on file  . Highest education level: Not on file  Occupational History  . Not on file  Tobacco Use  . Smoking status: Never Smoker  .  Smokeless tobacco: Never Used  Substance and Sexual Activity  . Alcohol use: No    Alcohol/week: 0.0 standard drinks  . Drug use: No  . Sexual activity: Not on file  Other Topics Concern  . Not on file  Social History Narrative  . Not on file   Social Determinants of Health   Financial Resource Strain:   . Difficulty of Paying Living Expenses:   Food Insecurity:   . Worried About Charity fundraiser in the Last Year:   . Arboriculturist in the Last Year:   Transportation Needs:   . Film/video editor (Medical):   Marland Kitchen Lack of Transportation (Non-Medical):   Physical Activity:   . Days of Exercise per Week:   . Minutes of Exercise per Session:   Stress:   . Feeling of Stress :   Social Connections:   . Frequency of Communication with Friends and Family:   . Frequency of Social Gatherings with Friends and Family:   . Attends Religious Services:   . Active Member of Clubs or Organizations:   . Attends Archivist Meetings:   Marland Kitchen Marital Status:   Intimate Partner Violence:   . Fear of Current or Ex-Partner:   . Emotionally Abused:   Marland Kitchen Physically Abused:   . Sexually Abused:       Family History  Problem Relation Age of Onset  . Multiple myeloma Sister   . Brain cancer Sister   . Dementia Mother        died at 68  . Stroke Mother   . Heart failure Mother   . Diabetes Mother   . Heart disease Father   . Prostate cancer Brother   . Colon cancer Neg Hx     ROS: _0  Positive   _1  Negative   _2  All sytems reviewed and are negative  Cardiovascular: _3  chest pain/pressure _4  palpitations _5  SOB lying flat _6  DOE _7  pain in legs while walking _8  pain in legs at rest _9  pain in legs at night _10  non-healing ulcers _11  hx of DVT _12  swelling in legs  Pulmonary: _13  productive cough _14  asthma/wheezing _15  home O2  Neurologic: _16  weakness in _17  arms _18  legs _19  numbness in _20  arms _21  legs _22  hx of CVA _23  mini stroke _24 difficulty speaking or slurred speech _25  temporary loss of vision in one eye _26  dizziness  Hematologic: _27  hx of cancer _28  bleeding problems _29  problems with blood clotting easily  Endocrine:   _30  diabetes _31  thyroid disease  GI _32  vomiting blood _33  blood in stool  GU: _34  CKD/renal failure _35  HD--_36  M/W/F or _37  T/T/S _38  burning with urination _39  blood in urine  Psychiatric: _40  anxiety _41  depression  Musculoskeletal: _42  arthritis _43  joint pain  Integumentary: _44  rashes _45  ulcers  Constitutional: _46  fever _47  chills   Physical Examination  Vitals:   07/13/19 0726  BP: (!) 158/58  Pulse: 69  Resp: 12  Temp: (!) 97.2 F (36.2 C)  SpO2: 100%   Body mass index is 24.99 kg/m.  General:  NAD Gait: Not observed HENT: WNL, normocephalic Pulmonary: normal non-labored breathing, without Rales, rhonchi,  wheezing Cardiac: regular, without  Murmurs, rubs or gallops Abdomen:soft, NT/ND, no masses Vascular Exam/Pulses: Left arm brachiocephalic fistula with good thrill  CBC    Component Value Date/Time   WBC 4.2 07/11/2019 0932   RBC 3.61 (L) 07/11/2019 0932   HGB 13.3 07/13/2019 0843   HCT 39.0  07/13/2019 0843   PLT 230 07/11/2019 0932   MCV 105.5 (H) 07/11/2019 0932   MCH 33.0 07/11/2019 0932   MCHC 31.2 07/11/2019 0932   RDW 16.3 (H) 07/11/2019 0932   LYMPHSABS 0.7 07/11/2019 0932   MONOABS 0.3 07/11/2019 0932   EOSABS 0.2 07/11/2019 0932   BASOSABS 0.1 07/11/2019 0932    BMET    Component Value Date/Time   NA 137 07/13/2019 0843   K 4.7 07/13/2019 0843   CL 99 07/13/2019 0843   CO2 28 07/11/2019 0932   GLUCOSE 95 07/13/2019 0843   BUN 30 (H) 07/13/2019 0843   CREATININE 4.30 (H) 07/13/2019 0843   CALCIUM 8.7 (L) 07/11/2019 0932   CALCIUM 9.0 10/18/2018 1059   GFRNONAA 9 (L) 07/11/2019 0932   GFRAA 11 (L) 07/11/2019 0932    COAGS: Lab Results  Component Value Date   INR 1.0 08/24/2007     Non-Invasive Vascular Imaging:    None   ASSESSMENT/PLAN: This is a 84 y.o. female with end-stage renal disease and left arm brachiocephalic AV fistula.  Patient is very poor historian and is somewhat unclear as to why she is here.  She states that the fistula is working without issue.  Our office received information that there were issues with function of the fistula.  We will plan left arm fistulogram after risk benefits discussed.     Marty Heck, MD [facility] [place] Office: [phone]

## 2019-07-13 NOTE — Op Note (Signed)
    OPERATIVE NOTE   PROCEDURE: 1. left brachiocephalic arteriovenous fistula cannulation under ultrasound guidance 2. left arm fistulogram including central venogram 3. left cephalic venoplasty in upper arm (6 mm Mustang and 6 mm drug coated Ranger)   PRE-OPERATIVE DIAGNOSIS: Malfunctioning left arteriovenous fistula  POST-OPERATIVE DIAGNOSIS: same as above   SURGEON: Marty Heck, MD  ANESTHESIA: local  ESTIMATED BLOOD LOSS: 5 cc  FINDING(S): 1. Initially the left brachiocephalic fistula was pulsatile.  There is an aneurysmal segment with some small pseudoaneurysms in the mid arm.  Just proximal to this there was a focal high-grade stenosis with a second stenosis adjacent.  The vein appeared on the smaller side in the upper arm as well.  All these lesions were crossed and angioplastied with a 6 mm Mustang and 6 mm drug-coated Ranger.  Excellent thrill.  Less than 30% residual stenosis.  Did not want to  be more aggressive given significant pain and discomfort with 6 mm balloon.  SPECIMEN(S):  None  CONTRAST: 75 cc  INDICATIONS: Carly Wood is a 84 y.o. female who  presents with malfunctioning left arteriovenous fistula.  The patient is scheduled for left arm fistulogram.  The patient is aware the risks include but are not limited to: bleeding, infection, thrombosis of the cannulated access, and possible anaphylactic reaction to the contrast.  The patient is aware of the risks of the procedure and elects to proceed forward.  DESCRIPTION: After full informed written consent was obtained, the patient was brought back to the angiography suite and placed supine upon the angiography table.  The patient was connected to monitoring equipment.  The left arm was prepped and draped in the standard fashion for a left arm fistulogram.  Under ultrasound guidance, the left arteriovenous fistula was evaluated, it was patent, an image was saved.  It was cannulated with a micropuncture  needle under ultrasound guidance. The microwire was advanced into the fistula and the needle was exchanged for the a microsheath, which was lodged 2 cm into the access.  The wire was removed and the sheath was connected to the IV extension tubing.  Hand injections were completed to image the access from the antecubitum up to the level of axilla.  The central venous structures were also imaged by hand injections.  Ultimately elected to intervene on several tandem lesions in the upper arm fistula.  Patient was given 3000 units IV heparin.  I then used a Bentson wire and exchanged for a short 6 [place] sheath.  The lesions were crossed with assistance of a KMP catheter.  Initially used a 6 mm Mustang that was inflated to nominal pressure for 2 minutes.  She had a lot of pain and discomfort with this and I did not want to be more aggressive and risk rupturing the fistula.  Then used a long 6 mm x 150 mm drug-coated Ranger over a [place] core wire to nominal pressure for 3 minutes.  Satisfied with the results with a much better thrill in the fistula versus pulsatile at the beginning the case we elected no further intervention.  Wires and catheters were removed.  A 4-0 Monocryl purse-string suture was sewn around the sheath.  The sheath was removed while tying down the suture.  A sterile bandage was applied to the puncture site.  COMPLICATIONS: None  CONDITION: Stable  Marty Heck, MD [facility] Office: [phone]  07/13/2019 10:32 AM

## 2019-07-14 DIAGNOSIS — Z23 Encounter for immunization: Secondary | ICD-10-CM | POA: Diagnosis not present

## 2019-07-14 DIAGNOSIS — N186 End stage renal disease: Secondary | ICD-10-CM | POA: Diagnosis not present

## 2019-07-14 DIAGNOSIS — Z992 Dependence on renal dialysis: Secondary | ICD-10-CM | POA: Diagnosis not present

## 2019-07-14 LAB — IMMUNOFIXATION ELECTROPHORESIS
IgA: 161 mg/dL (ref 64–422)
IgG (Immunoglobin G), Serum: 770 mg/dL (ref 586–1602)
IgM (Immunoglobulin M), Srm: 12 mg/dL — ABNORMAL LOW (ref 26–217)
Total Protein ELP: 6 g/dL (ref 6.0–8.5)

## 2019-07-17 DIAGNOSIS — N186 End stage renal disease: Secondary | ICD-10-CM | POA: Diagnosis not present

## 2019-07-17 DIAGNOSIS — Z23 Encounter for immunization: Secondary | ICD-10-CM | POA: Diagnosis not present

## 2019-07-17 DIAGNOSIS — Z992 Dependence on renal dialysis: Secondary | ICD-10-CM | POA: Diagnosis not present

## 2019-07-18 ENCOUNTER — Inpatient Hospital Stay (HOSPITAL_COMMUNITY): Payer: Medicare PPO

## 2019-07-18 ENCOUNTER — Other Ambulatory Visit: Payer: Self-pay

## 2019-07-18 ENCOUNTER — Encounter (HOSPITAL_COMMUNITY): Payer: Self-pay

## 2019-07-18 VITALS — BP 110/45 | HR 69 | Temp 96.9°F | Resp 18

## 2019-07-18 DIAGNOSIS — R600 Localized edema: Secondary | ICD-10-CM | POA: Diagnosis not present

## 2019-07-18 DIAGNOSIS — D630 Anemia in neoplastic disease: Secondary | ICD-10-CM | POA: Diagnosis not present

## 2019-07-18 DIAGNOSIS — R0602 Shortness of breath: Secondary | ICD-10-CM | POA: Diagnosis not present

## 2019-07-18 DIAGNOSIS — C9000 Multiple myeloma not having achieved remission: Secondary | ICD-10-CM

## 2019-07-18 DIAGNOSIS — C50911 Malignant neoplasm of unspecified site of right female breast: Secondary | ICD-10-CM | POA: Diagnosis not present

## 2019-07-18 DIAGNOSIS — Z9011 Acquired absence of right breast and nipple: Secondary | ICD-10-CM | POA: Diagnosis not present

## 2019-07-18 DIAGNOSIS — Z9221 Personal history of antineoplastic chemotherapy: Secondary | ICD-10-CM | POA: Diagnosis not present

## 2019-07-18 DIAGNOSIS — Z5111 Encounter for antineoplastic chemotherapy: Secondary | ICD-10-CM | POA: Diagnosis not present

## 2019-07-18 DIAGNOSIS — Z79811 Long term (current) use of aromatase inhibitors: Secondary | ICD-10-CM | POA: Diagnosis not present

## 2019-07-18 LAB — COMPREHENSIVE METABOLIC PANEL
ALT: 10 U/L (ref 0–44)
AST: 24 U/L (ref 15–41)
Albumin: 3.5 g/dL (ref 3.5–5.0)
Alkaline Phosphatase: 54 U/L (ref 38–126)
Anion gap: 10 (ref 5–15)
BUN: 20 mg/dL (ref 8–23)
CO2: 30 mmol/L (ref 22–32)
Calcium: 8.7 mg/dL — ABNORMAL LOW (ref 8.9–10.3)
Chloride: 95 mmol/L — ABNORMAL LOW (ref 98–111)
Creatinine, Ser: 3.99 mg/dL — ABNORMAL HIGH (ref 0.44–1.00)
GFR calc Af Amer: 11 mL/min — ABNORMAL LOW (ref >60)
GFR calc non Af Amer: 10 mL/min — ABNORMAL LOW (ref >60)
Glucose, Bld: 215 mg/dL — ABNORMAL HIGH (ref 70–99)
Potassium: 3.9 mmol/L (ref 3.5–5.1)
Sodium: 135 mmol/L (ref 135–145)
Total Bilirubin: 0.3 mg/dL (ref 0.3–1.2)
Total Protein: 6.2 g/dL — ABNORMAL LOW (ref 6.5–8.1)

## 2019-07-18 LAB — CBC WITH DIFFERENTIAL/PLATELET
Abs Immature Granulocytes: 0.01 10*3/uL (ref 0.00–0.07)
Basophils Absolute: 0 10*3/uL (ref 0.0–0.1)
Basophils Relative: 1 %
Eosinophils Absolute: 0.2 10*3/uL (ref 0.0–0.5)
Eosinophils Relative: 4 %
HCT: 36.5 % (ref 36.0–46.0)
Hemoglobin: 11.5 g/dL — ABNORMAL LOW (ref 12.0–15.0)
Immature Granulocytes: 0 %
Lymphocytes Relative: 21 %
Lymphs Abs: 0.9 10*3/uL (ref 0.7–4.0)
MCH: 32.9 pg (ref 26.0–34.0)
MCHC: 31.5 g/dL (ref 30.0–36.0)
MCV: 104.3 fL — ABNORMAL HIGH (ref 80.0–100.0)
Monocytes Absolute: 0.4 10*3/uL (ref 0.1–1.0)
Monocytes Relative: 8 %
Neutro Abs: 2.9 10*3/uL (ref 1.7–7.7)
Neutrophils Relative %: 66 %
Platelets: 180 10*3/uL (ref 150–400)
RBC: 3.5 MIL/uL — ABNORMAL LOW (ref 3.87–5.11)
RDW: 15.6 % — ABNORMAL HIGH (ref 11.5–15.5)
WBC: 4.4 10*3/uL (ref 4.0–10.5)
nRBC: 0 % (ref 0.0–0.2)

## 2019-07-18 MED ORDER — BORTEZOMIB CHEMO SQ INJECTION 3.5 MG (2.5MG/ML)
1.3000 mg/m2 | Freq: Once | INTRAMUSCULAR | Status: AC
  Administered 2019-07-18: 2.25 mg via SUBCUTANEOUS
  Filled 2019-07-18: qty 0.9

## 2019-07-18 MED ORDER — DEXAMETHASONE 4 MG PO TABS
10.0000 mg | ORAL_TABLET | Freq: Once | ORAL | Status: AC
  Administered 2019-07-18: 11:00:00 10 mg via ORAL
  Filled 2019-07-18: qty 3

## 2019-07-18 MED ORDER — PROCHLORPERAZINE MALEATE 10 MG PO TABS
10.0000 mg | ORAL_TABLET | Freq: Once | ORAL | Status: AC
  Administered 2019-07-18: 10 mg via ORAL
  Filled 2019-07-18: qty 1

## 2019-07-18 NOTE — Patient Instructions (Signed)
[facility] Discharge Instructions for Patients Receiving Chemotherapy   Beginning January 23rd 2017 lab work for the Cancer Center will be done in the  Main lab at [place] on 1st floor. If you have a lab appointment with the Cancer Center please come in thru the  Main Entrance and check in at the main information desk   Today you received the following chemotherapy agents Velcade injection. Follow-up as scheduled. Call clinic for any questions or concerns  To help prevent nausea and vomiting after your treatment, we encourage you to take your nausea medication   If you develop nausea and vomiting, or diarrhea that is not controlled by your medication, call the clinic.  The clinic phone number is [phone]. Office hours are Monday-Friday 8:30am-5:00pm.  BELOW ARE SYMPTOMS THAT SHOULD BE REPORTED IMMEDIATELY:  *FEVER GREATER THAN 101.0 F  *CHILLS WITH OR WITHOUT FEVER  NAUSEA AND VOMITING THAT IS NOT CONTROLLED WITH YOUR NAUSEA MEDICATION  *UNUSUAL SHORTNESS OF BREATH  *UNUSUAL BRUISING OR BLEEDING  TENDERNESS IN MOUTH AND THROAT WITH OR WITHOUT PRESENCE OF ULCERS  *URINARY PROBLEMS  *BOWEL PROBLEMS  UNUSUAL RASH Items with * indicate a potential emergency and should be followed up as soon as possible. If you have an emergency after office hours please contact your primary care physician or go to the nearest emergency department.  Please call the clinic during office hours if you have any questions or concerns.   You may also contact the Patient Navigator at [phone] should you have any questions or need assistance in obtaining follow up care.      Resources For Cancer Patients and their Caregivers ? American Cancer Society: Can assist with transportation, wigs, general needs, runs Look Good Feel Better.        1-888-227-6333 ? Cancer Care: Provides financial assistance, online support groups, medication/co-pay assistance.   1-800-813-HOPE (4673) ? [facility] Assists [place] cancer patients and their families through emotional , educational and financial support.  336-427-4357 ? [place] DSS Where to apply for food stamps, Medicaid and utility assistance. 336-342-1394 ? RCATS: Transportation to medical appointments. 336-347-2287 ? Social Security Administration: May apply for disability if have a Stage IV cancer. 336-342-7796 1-800-772-1213 ? [place] Aging, Disability and Transit Services: Assists with nutrition, care and transit needs. 336-349-2343         

## 2019-07-18 NOTE — Progress Notes (Signed)
[place] reviewed with RLockamy NP and pt approved for Velcade injection today per NP                                                        Carly Wood tolerated Velcade injection well without complaints or incident. VSS Pt discharged via wheelchair in satisfactory condition accompanied by her sister

## 2019-07-19 DIAGNOSIS — N186 End stage renal disease: Secondary | ICD-10-CM | POA: Diagnosis not present

## 2019-07-19 DIAGNOSIS — Z992 Dependence on renal dialysis: Secondary | ICD-10-CM | POA: Diagnosis not present

## 2019-07-19 DIAGNOSIS — Z23 Encounter for immunization: Secondary | ICD-10-CM | POA: Diagnosis not present

## 2019-07-21 DIAGNOSIS — N186 End stage renal disease: Secondary | ICD-10-CM | POA: Diagnosis not present

## 2019-07-21 DIAGNOSIS — Z23 Encounter for immunization: Secondary | ICD-10-CM | POA: Diagnosis not present

## 2019-07-21 DIAGNOSIS — Z992 Dependence on renal dialysis: Secondary | ICD-10-CM | POA: Diagnosis not present

## 2019-07-22 NOTE — Assessment & Plan Note (Signed)
1. IgA lambda plasma cell myeloma, stage II, standard risk: -BMBX on 09/20/2018 shows plasma cell myeloma, 30% plasma cells. FISH panel was normal. Chromosome analysis normal. -Labs at diagnosis on 08/29/2018 with M spike 0.9 g. Kappa light chains 148, lambda light chains 1132, ratio 0.13. LDH normal. Beta-2 microglobulin 13.5. She had transfusion dependent anemia. -Velcade and dexamethasone started on 10/11/2018. -We reviewed myeloma panel from 06/13/2018 which showed M spike is undetectable. Kappa light chains are 120, lambda light chains 141, ratio 0.85. -CBC was grossly normal. She will proceed with her treatment. I will reevaluate her in 4 weeks.  2. ESRD on HD: -Started on hemodialysis and is continuing on Monday, Wednesday and Friday. Tolerating well.  3. Bone strengthening: -Bisphosphonates not started due to poor renal function.  4. Osteopenia: -Bone density on 05/16/2015 shows osteopenia. We will plan to repeat scans soon.  5. Stage I IDC of the right breast, ER/PR positive, HER-2 negative: -She will continue anastrozole and tolerating well. Mild right upper extremity lymphedema stable.

## 2019-07-24 DIAGNOSIS — N186 End stage renal disease: Secondary | ICD-10-CM | POA: Diagnosis not present

## 2019-07-24 DIAGNOSIS — Z992 Dependence on renal dialysis: Secondary | ICD-10-CM | POA: Diagnosis not present

## 2019-07-24 DIAGNOSIS — Z23 Encounter for immunization: Secondary | ICD-10-CM | POA: Diagnosis not present

## 2019-07-25 ENCOUNTER — Other Ambulatory Visit: Payer: Self-pay

## 2019-07-25 ENCOUNTER — Inpatient Hospital Stay (HOSPITAL_COMMUNITY): Payer: Medicare PPO

## 2019-07-25 ENCOUNTER — Encounter (HOSPITAL_COMMUNITY): Payer: Self-pay

## 2019-07-25 VITALS — BP 102/50 | HR 64 | Temp 97.1°F | Resp 18

## 2019-07-25 DIAGNOSIS — C9000 Multiple myeloma not having achieved remission: Secondary | ICD-10-CM

## 2019-07-25 DIAGNOSIS — C50911 Malignant neoplasm of unspecified site of right female breast: Secondary | ICD-10-CM | POA: Diagnosis not present

## 2019-07-25 DIAGNOSIS — R0602 Shortness of breath: Secondary | ICD-10-CM | POA: Diagnosis not present

## 2019-07-25 DIAGNOSIS — Z9011 Acquired absence of right breast and nipple: Secondary | ICD-10-CM | POA: Diagnosis not present

## 2019-07-25 DIAGNOSIS — Z79811 Long term (current) use of aromatase inhibitors: Secondary | ICD-10-CM | POA: Diagnosis not present

## 2019-07-25 DIAGNOSIS — Z9221 Personal history of antineoplastic chemotherapy: Secondary | ICD-10-CM | POA: Diagnosis not present

## 2019-07-25 DIAGNOSIS — Z5111 Encounter for antineoplastic chemotherapy: Secondary | ICD-10-CM | POA: Diagnosis not present

## 2019-07-25 DIAGNOSIS — D630 Anemia in neoplastic disease: Secondary | ICD-10-CM | POA: Diagnosis not present

## 2019-07-25 DIAGNOSIS — R600 Localized edema: Secondary | ICD-10-CM | POA: Diagnosis not present

## 2019-07-25 LAB — CBC WITH DIFFERENTIAL/PLATELET
Abs Immature Granulocytes: 0.01 10*3/uL (ref 0.00–0.07)
Basophils Absolute: 0 10*3/uL (ref 0.0–0.1)
Basophils Relative: 1 %
Eosinophils Absolute: 0.1 10*3/uL (ref 0.0–0.5)
Eosinophils Relative: 3 %
HCT: 35.5 % — ABNORMAL LOW (ref 36.0–46.0)
Hemoglobin: 11.3 g/dL — ABNORMAL LOW (ref 12.0–15.0)
Immature Granulocytes: 0 %
Lymphocytes Relative: 18 %
Lymphs Abs: 1 10*3/uL (ref 0.7–4.0)
MCH: 32.9 pg (ref 26.0–34.0)
MCHC: 31.8 g/dL (ref 30.0–36.0)
MCV: 103.5 fL — ABNORMAL HIGH (ref 80.0–100.0)
Monocytes Absolute: 0.3 10*3/uL (ref 0.1–1.0)
Monocytes Relative: 6 %
Neutro Abs: 3.9 10*3/uL (ref 1.7–7.7)
Neutrophils Relative %: 72 %
Platelets: 175 10*3/uL (ref 150–400)
RBC: 3.43 MIL/uL — ABNORMAL LOW (ref 3.87–5.11)
RDW: 15.8 % — ABNORMAL HIGH (ref 11.5–15.5)
WBC: 5.4 10*3/uL (ref 4.0–10.5)
nRBC: 0 % (ref 0.0–0.2)

## 2019-07-25 LAB — COMPREHENSIVE METABOLIC PANEL
ALT: 14 U/L (ref 0–44)
AST: 25 U/L (ref 15–41)
Albumin: 3.6 g/dL (ref 3.5–5.0)
Alkaline Phosphatase: 55 U/L (ref 38–126)
Anion gap: 13 (ref 5–15)
BUN: 23 mg/dL (ref 8–23)
CO2: 30 mmol/L (ref 22–32)
Calcium: 9 mg/dL (ref 8.9–10.3)
Chloride: 93 mmol/L — ABNORMAL LOW (ref 98–111)
Creatinine, Ser: 4.04 mg/dL — ABNORMAL HIGH (ref 0.44–1.00)
GFR calc Af Amer: 11 mL/min — ABNORMAL LOW (ref >60)
GFR calc non Af Amer: 10 mL/min — ABNORMAL LOW (ref >60)
Glucose, Bld: 214 mg/dL — ABNORMAL HIGH (ref 70–99)
Potassium: 4.3 mmol/L (ref 3.5–5.1)
Sodium: 136 mmol/L (ref 135–145)
Total Bilirubin: 0.3 mg/dL (ref 0.3–1.2)
Total Protein: 6.4 g/dL — ABNORMAL LOW (ref 6.5–8.1)

## 2019-07-25 MED ORDER — ONDANSETRON 8 MG PO TBDP
ORAL_TABLET | ORAL | Status: AC
  Filled 2019-07-25: qty 1

## 2019-07-25 MED ORDER — BORTEZOMIB CHEMO SQ INJECTION 3.5 MG (2.5MG/ML)
1.3000 mg/m2 | Freq: Once | INTRAMUSCULAR | Status: AC
  Administered 2019-07-25: 2.25 mg via SUBCUTANEOUS
  Filled 2019-07-25: qty 0.9

## 2019-07-25 MED ORDER — DEXAMETHASONE 4 MG PO TABS
10.0000 mg | ORAL_TABLET | Freq: Once | ORAL | Status: AC
  Administered 2019-07-25: 10 mg via ORAL
  Filled 2019-07-25: qty 3

## 2019-07-25 MED ORDER — PROCHLORPERAZINE MALEATE 10 MG PO TABS
10.0000 mg | ORAL_TABLET | Freq: Once | ORAL | Status: AC
  Administered 2019-07-25: 10 mg via ORAL
  Filled 2019-07-25: qty 1

## 2019-07-25 MED ORDER — ONDANSETRON 8 MG PO TBDP
8.0000 mg | ORAL_TABLET | Freq: Once | ORAL | Status: AC
  Administered 2019-07-25: 8 mg via ORAL

## 2019-07-25 NOTE — Patient Instructions (Signed)
[facility] Discharge Instructions for Patients Receiving Chemotherapy   Beginning January 23rd 2017 lab work for the Cancer Center will be done in the  Main lab at [place] on 1st floor. If you have a lab appointment with the Cancer Center please come in thru the  Main Entrance and check in at the main information desk   Today you received the following chemotherapy agents Velcade injection. Follow-up as scheduled. Call clinic for any questions or concerns  To help prevent nausea and vomiting after your treatment, we encourage you to take your nausea medication   If you develop nausea and vomiting, or diarrhea that is not controlled by your medication, call the clinic.  The clinic phone number is [phone]. Office hours are Monday-Friday 8:30am-5:00pm.  BELOW ARE SYMPTOMS THAT SHOULD BE REPORTED IMMEDIATELY:  *FEVER GREATER THAN 101.0 F  *CHILLS WITH OR WITHOUT FEVER  NAUSEA AND VOMITING THAT IS NOT CONTROLLED WITH YOUR NAUSEA MEDICATION  *UNUSUAL SHORTNESS OF BREATH  *UNUSUAL BRUISING OR BLEEDING  TENDERNESS IN MOUTH AND THROAT WITH OR WITHOUT PRESENCE OF ULCERS  *URINARY PROBLEMS  *BOWEL PROBLEMS  UNUSUAL RASH Items with * indicate a potential emergency and should be followed up as soon as possible. If you have an emergency after office hours please contact your primary care physician or go to the nearest emergency department.  Please call the clinic during office hours if you have any questions or concerns.   You may also contact the Patient Navigator at [phone] should you have any questions or need assistance in obtaining follow up care.      Resources For Cancer Patients and their Caregivers ? American Cancer Society: Can assist with transportation, wigs, general needs, runs Look Good Feel Better.        1-888-227-6333 ? Cancer Care: Provides financial assistance, online support groups, medication/co-pay assistance.   1-800-813-HOPE (4673) ? [facility] Assists [place] cancer patients and their families through emotional , educational and financial support.  336-427-4357 ? [place] DSS Where to apply for food stamps, Medicaid and utility assistance. 336-342-1394 ? RCATS: Transportation to medical appointments. 336-347-2287 ? Social Security Administration: May apply for disability if have a Stage IV cancer. 336-342-7796 1-800-772-1213 ? [place] Aging, Disability and Transit Services: Assists with nutrition, care and transit needs. 336-349-2343         

## 2019-07-25 NOTE — Progress Notes (Signed)
1128 Labs reviewed with RLockamy NP and pt approved for Velcade injection today per NP                                                        Carly Wood Patient tolerated Velcade injection well without complaints or incident. Pt nauseated and spitting up today so Zofran ODT given to pt per MD order. This medication was effective prior to discharge. VSS Pt discharged via wheelchair in satisfactory condition accompanied by family member

## 2019-07-26 ENCOUNTER — Other Ambulatory Visit (HOSPITAL_COMMUNITY): Payer: Self-pay | Admitting: *Deleted

## 2019-07-26 DIAGNOSIS — Z23 Encounter for immunization: Secondary | ICD-10-CM | POA: Diagnosis not present

## 2019-07-26 DIAGNOSIS — Z992 Dependence on renal dialysis: Secondary | ICD-10-CM | POA: Diagnosis not present

## 2019-07-26 DIAGNOSIS — C9000 Multiple myeloma not having achieved remission: Secondary | ICD-10-CM

## 2019-07-26 DIAGNOSIS — N186 End stage renal disease: Secondary | ICD-10-CM | POA: Diagnosis not present

## 2019-07-26 NOTE — Progress Notes (Signed)
I received notification from our cancer center social worker that patient's sister is having a hard time caring for them in the home.  She is not quite ready for patient to be placed into nursing facility.  She is requesting assistance at home. Patient has increased need for disease process education, assistance with activities of daily living and access to community resources.  I have reached out to [facility] and they are willing to accept her.

## 2019-07-28 DIAGNOSIS — Z992 Dependence on renal dialysis: Secondary | ICD-10-CM | POA: Diagnosis not present

## 2019-07-28 DIAGNOSIS — Z23 Encounter for immunization: Secondary | ICD-10-CM | POA: Diagnosis not present

## 2019-07-28 DIAGNOSIS — N186 End stage renal disease: Secondary | ICD-10-CM | POA: Diagnosis not present

## 2019-07-30 DIAGNOSIS — Z992 Dependence on renal dialysis: Secondary | ICD-10-CM | POA: Diagnosis not present

## 2019-07-30 DIAGNOSIS — C9000 Multiple myeloma not having achieved remission: Secondary | ICD-10-CM | POA: Diagnosis not present

## 2019-07-30 DIAGNOSIS — Z170 Estrogen receptor positive status [ER+]: Secondary | ICD-10-CM | POA: Diagnosis not present

## 2019-07-30 DIAGNOSIS — E1122 Type 2 diabetes mellitus with diabetic chronic kidney disease: Secondary | ICD-10-CM | POA: Diagnosis not present

## 2019-07-30 DIAGNOSIS — I129 Hypertensive chronic kidney disease with stage 1 through stage 4 chronic kidney disease, or unspecified chronic kidney disease: Secondary | ICD-10-CM | POA: Diagnosis not present

## 2019-07-30 DIAGNOSIS — D631 Anemia in chronic kidney disease: Secondary | ICD-10-CM | POA: Diagnosis not present

## 2019-07-30 DIAGNOSIS — N184 Chronic kidney disease, stage 4 (severe): Secondary | ICD-10-CM | POA: Diagnosis not present

## 2019-07-30 DIAGNOSIS — C50411 Malignant neoplasm of upper-outer quadrant of right female breast: Secondary | ICD-10-CM | POA: Diagnosis not present

## 2019-07-30 DIAGNOSIS — F419 Anxiety disorder, unspecified: Secondary | ICD-10-CM | POA: Diagnosis not present

## 2019-07-31 ENCOUNTER — Telehealth (HOSPITAL_COMMUNITY): Payer: Self-pay | Admitting: *Deleted

## 2019-07-31 DIAGNOSIS — N186 End stage renal disease: Secondary | ICD-10-CM | POA: Diagnosis not present

## 2019-07-31 DIAGNOSIS — Z992 Dependence on renal dialysis: Secondary | ICD-10-CM | POA: Diagnosis not present

## 2019-07-31 DIAGNOSIS — Z23 Encounter for immunization: Secondary | ICD-10-CM | POA: Diagnosis not present

## 2019-07-31 NOTE — Telephone Encounter (Signed)
Verbal orders from Dr. Delton Coombes given to [facility] from [place] at [facility] for pt to start skilled nursing home care.

## 2019-08-01 ENCOUNTER — Telehealth (HOSPITAL_COMMUNITY): Payer: Self-pay | Admitting: *Deleted

## 2019-08-01 DIAGNOSIS — C9000 Multiple myeloma not having achieved remission: Secondary | ICD-10-CM | POA: Diagnosis not present

## 2019-08-01 DIAGNOSIS — D631 Anemia in chronic kidney disease: Secondary | ICD-10-CM | POA: Diagnosis not present

## 2019-08-01 DIAGNOSIS — E1122 Type 2 diabetes mellitus with diabetic chronic kidney disease: Secondary | ICD-10-CM | POA: Diagnosis not present

## 2019-08-01 DIAGNOSIS — I129 Hypertensive chronic kidney disease with stage 1 through stage 4 chronic kidney disease, or unspecified chronic kidney disease: Secondary | ICD-10-CM | POA: Diagnosis not present

## 2019-08-01 DIAGNOSIS — Z992 Dependence on renal dialysis: Secondary | ICD-10-CM | POA: Diagnosis not present

## 2019-08-01 DIAGNOSIS — F419 Anxiety disorder, unspecified: Secondary | ICD-10-CM | POA: Diagnosis not present

## 2019-08-01 DIAGNOSIS — C50411 Malignant neoplasm of upper-outer quadrant of right female breast: Secondary | ICD-10-CM | POA: Diagnosis not present

## 2019-08-01 DIAGNOSIS — N184 Chronic kidney disease, stage 4 (severe): Secondary | ICD-10-CM | POA: Diagnosis not present

## 2019-08-01 DIAGNOSIS — Z170 Estrogen receptor positive status [ER+]: Secondary | ICD-10-CM | POA: Diagnosis not present

## 2019-08-01 NOTE — Telephone Encounter (Signed)
Verbal order given from Dr. Delton Coombes to Estill Bamberg, RN at [place] at [facility] for this pt to receive nursing services once a week for eight weeks.

## 2019-08-02 DIAGNOSIS — Z23 Encounter for immunization: Secondary | ICD-10-CM | POA: Diagnosis not present

## 2019-08-02 DIAGNOSIS — N186 End stage renal disease: Secondary | ICD-10-CM | POA: Diagnosis not present

## 2019-08-02 DIAGNOSIS — Z992 Dependence on renal dialysis: Secondary | ICD-10-CM | POA: Diagnosis not present

## 2019-08-03 DIAGNOSIS — N184 Chronic kidney disease, stage 4 (severe): Secondary | ICD-10-CM | POA: Diagnosis not present

## 2019-08-03 DIAGNOSIS — F419 Anxiety disorder, unspecified: Secondary | ICD-10-CM | POA: Diagnosis not present

## 2019-08-03 DIAGNOSIS — C9000 Multiple myeloma not having achieved remission: Secondary | ICD-10-CM | POA: Diagnosis not present

## 2019-08-03 DIAGNOSIS — I129 Hypertensive chronic kidney disease with stage 1 through stage 4 chronic kidney disease, or unspecified chronic kidney disease: Secondary | ICD-10-CM | POA: Diagnosis not present

## 2019-08-03 DIAGNOSIS — Z992 Dependence on renal dialysis: Secondary | ICD-10-CM | POA: Diagnosis not present

## 2019-08-03 DIAGNOSIS — Z170 Estrogen receptor positive status [ER+]: Secondary | ICD-10-CM | POA: Diagnosis not present

## 2019-08-03 DIAGNOSIS — E1122 Type 2 diabetes mellitus with diabetic chronic kidney disease: Secondary | ICD-10-CM | POA: Diagnosis not present

## 2019-08-03 DIAGNOSIS — D631 Anemia in chronic kidney disease: Secondary | ICD-10-CM | POA: Diagnosis not present

## 2019-08-03 DIAGNOSIS — C50411 Malignant neoplasm of upper-outer quadrant of right female breast: Secondary | ICD-10-CM | POA: Diagnosis not present

## 2019-08-04 DIAGNOSIS — Z23 Encounter for immunization: Secondary | ICD-10-CM | POA: Diagnosis not present

## 2019-08-04 DIAGNOSIS — Z992 Dependence on renal dialysis: Secondary | ICD-10-CM | POA: Diagnosis not present

## 2019-08-04 DIAGNOSIS — N186 End stage renal disease: Secondary | ICD-10-CM | POA: Diagnosis not present

## 2019-08-07 DIAGNOSIS — Z992 Dependence on renal dialysis: Secondary | ICD-10-CM | POA: Diagnosis not present

## 2019-08-07 DIAGNOSIS — Z23 Encounter for immunization: Secondary | ICD-10-CM | POA: Diagnosis not present

## 2019-08-07 DIAGNOSIS — N186 End stage renal disease: Secondary | ICD-10-CM | POA: Diagnosis not present

## 2019-08-08 ENCOUNTER — Other Ambulatory Visit: Payer: Self-pay

## 2019-08-08 ENCOUNTER — Inpatient Hospital Stay (HOSPITAL_COMMUNITY): Payer: Medicare PPO

## 2019-08-08 ENCOUNTER — Inpatient Hospital Stay (HOSPITAL_COMMUNITY): Payer: Medicare PPO | Admitting: Hematology

## 2019-08-08 ENCOUNTER — Inpatient Hospital Stay (HOSPITAL_COMMUNITY): Payer: Medicare PPO | Attending: Hematology

## 2019-08-08 VITALS — BP 95/47 | HR 63 | Temp 96.6°F | Resp 18 | Wt 139.0 lb

## 2019-08-08 DIAGNOSIS — Z170 Estrogen receptor positive status [ER+]: Secondary | ICD-10-CM | POA: Diagnosis not present

## 2019-08-08 DIAGNOSIS — I120 Hypertensive chronic kidney disease with stage 5 chronic kidney disease or end stage renal disease: Secondary | ICD-10-CM | POA: Diagnosis not present

## 2019-08-08 DIAGNOSIS — C9000 Multiple myeloma not having achieved remission: Secondary | ICD-10-CM | POA: Diagnosis not present

## 2019-08-08 DIAGNOSIS — N186 End stage renal disease: Secondary | ICD-10-CM | POA: Diagnosis not present

## 2019-08-08 DIAGNOSIS — M8580 Other specified disorders of bone density and structure, unspecified site: Secondary | ICD-10-CM | POA: Diagnosis not present

## 2019-08-08 DIAGNOSIS — Z992 Dependence on renal dialysis: Secondary | ICD-10-CM | POA: Diagnosis not present

## 2019-08-08 DIAGNOSIS — C50911 Malignant neoplasm of unspecified site of right female breast: Secondary | ICD-10-CM | POA: Insufficient documentation

## 2019-08-08 DIAGNOSIS — E1122 Type 2 diabetes mellitus with diabetic chronic kidney disease: Secondary | ICD-10-CM | POA: Insufficient documentation

## 2019-08-08 DIAGNOSIS — Z5112 Encounter for antineoplastic immunotherapy: Secondary | ICD-10-CM | POA: Diagnosis not present

## 2019-08-08 DIAGNOSIS — Z79811 Long term (current) use of aromatase inhibitors: Secondary | ICD-10-CM | POA: Insufficient documentation

## 2019-08-08 LAB — COMPREHENSIVE METABOLIC PANEL
ALT: 13 U/L (ref 0–44)
AST: 24 U/L (ref 15–41)
Albumin: 3.6 g/dL (ref 3.5–5.0)
Alkaline Phosphatase: 58 U/L (ref 38–126)
Anion gap: 12 (ref 5–15)
BUN: 22 mg/dL (ref 8–23)
CO2: 29 mmol/L (ref 22–32)
Calcium: 8.9 mg/dL (ref 8.9–10.3)
Chloride: 98 mmol/L (ref 98–111)
Creatinine, Ser: 4.23 mg/dL — ABNORMAL HIGH (ref 0.44–1.00)
GFR calc Af Amer: 11 mL/min — ABNORMAL LOW (ref >60)
GFR calc non Af Amer: 9 mL/min — ABNORMAL LOW (ref >60)
Glucose, Bld: 207 mg/dL — ABNORMAL HIGH (ref 70–99)
Potassium: 4 mmol/L (ref 3.5–5.1)
Sodium: 139 mmol/L (ref 135–145)
Total Bilirubin: 0.3 mg/dL (ref 0.3–1.2)
Total Protein: 6.6 g/dL (ref 6.5–8.1)

## 2019-08-08 LAB — CBC WITH DIFFERENTIAL/PLATELET
Abs Immature Granulocytes: 0.02 10*3/uL (ref 0.00–0.07)
Basophils Absolute: 0.1 10*3/uL (ref 0.0–0.1)
Basophils Relative: 1 %
Eosinophils Absolute: 0.1 10*3/uL (ref 0.0–0.5)
Eosinophils Relative: 3 %
HCT: 33.5 % — ABNORMAL LOW (ref 36.0–46.0)
Hemoglobin: 10.5 g/dL — ABNORMAL LOW (ref 12.0–15.0)
Immature Granulocytes: 0 %
Lymphocytes Relative: 18 %
Lymphs Abs: 0.9 10*3/uL (ref 0.7–4.0)
MCH: 32.9 pg (ref 26.0–34.0)
MCHC: 31.3 g/dL (ref 30.0–36.0)
MCV: 105 fL — ABNORMAL HIGH (ref 80.0–100.0)
Monocytes Absolute: 0.3 10*3/uL (ref 0.1–1.0)
Monocytes Relative: 7 %
Neutro Abs: 3.5 10*3/uL (ref 1.7–7.7)
Neutrophils Relative %: 71 %
Platelets: 216 10*3/uL (ref 150–400)
RBC: 3.19 MIL/uL — ABNORMAL LOW (ref 3.87–5.11)
RDW: 15.7 % — ABNORMAL HIGH (ref 11.5–15.5)
WBC: 4.8 10*3/uL (ref 4.0–10.5)
nRBC: 0 % (ref 0.0–0.2)

## 2019-08-08 LAB — LACTATE DEHYDROGENASE: LDH: 145 U/L (ref 98–192)

## 2019-08-08 MED ORDER — DEXAMETHASONE 4 MG PO TABS
10.0000 mg | ORAL_TABLET | Freq: Once | ORAL | Status: AC
  Administered 2019-08-08: 10 mg via ORAL
  Filled 2019-08-08: qty 3

## 2019-08-08 MED ORDER — PROCHLORPERAZINE MALEATE 10 MG PO TABS
10.0000 mg | ORAL_TABLET | Freq: Once | ORAL | Status: AC
  Administered 2019-08-08: 10 mg via ORAL
  Filled 2019-08-08: qty 1

## 2019-08-08 MED ORDER — IPRATROPIUM-ALBUTEROL 0.5-2.5 (3) MG/3ML IN SOLN
RESPIRATORY_TRACT | Status: AC
  Filled 2019-08-08: qty 3

## 2019-08-08 MED ORDER — BORTEZOMIB CHEMO SQ INJECTION 3.5 MG (2.5MG/ML)
1.3000 mg/m2 | Freq: Once | INTRAMUSCULAR | Status: AC
  Administered 2019-08-08: 2.25 mg via SUBCUTANEOUS
  Filled 2019-08-08: qty 0.9

## 2019-08-08 NOTE — Patient Instructions (Signed)
[facility] at [facility] Discharge Instructions  You were seen today by Dr. Katragadda. He went over your recent results. He will see you back in 6 weeks for labs and follow up.   Thank you for choosing  Cancer Center at [facility] to provide your oncology and hematology care.  To afford each patient quality time with our provider, please arrive at least 15 minutes before your scheduled appointment time.   If you have a lab appointment with the Cancer Center please come in thru the  Main Entrance and check in at the main information desk  You need to re-schedule your appointment should you arrive 10 or more minutes late.  We strive to give you quality time with our providers, and arriving late affects you and other patients whose appointments are after yours.  Also, if you no show three or more times for appointments you may be dismissed from the clinic at the providers discretion.     Again, thank you for choosing [facility].  Our hope is that these requests will decrease the amount of time that you wait before being seen by our physicians.       _____________________________________________________________  Should you have questions after your visit to [facility], please contact our office at [phone] between the hours of 8:00 a.m. and 4:30 p.m.  Voicemails left after 4:00 p.m. will not be returned until the following business day.  For prescription refill requests, have your pharmacy contact our office and allow 72 hours.    Cancer Center Support Programs:   > Cancer Support Group  2nd Tuesday of the month 1pm-2pm, Journey Room    

## 2019-08-08 NOTE — Progress Notes (Signed)
[facility] [place], [place] [address]   CLINIC:  Medical Oncology/Hematology  PCP:  Monico Blitz, MD [address] [place] [address]  [phone]  REASON FOR VISIT:  Follow-up for plasma cell myeloma and anemia  PRIOR THERAPY: None.  CURRENT THERAPY: Velcade, dexamethasone   INTERVAL HISTORY:  Carly [place], a 84 y.o. female, returns for routine follow-up for her plasma cell myeloma and anemia. Carly [place] was last seen on 07/11/2019.  On 07/13/2019 Carly [place] had her fistula ballooned by Dr. Monica Martinez.   Her sister notes that Carly [place] fell last night, but denies loss of consciousness.  Carly [place] reportedly felt weak after dialysis.  Carly [place] is looking into the possibility of nursing home care with her sister. Carly [place] is now receiving dialysis in [place].   Carly [place] denies any side affects from the treatment. Carly [place] denies numbness in the hands or feet.    REVIEW OF SYSTEMS:  Review of Systems  Constitutional: Positive for fatigue.  HENT:   Positive for trouble swallowing.   Psychiatric/Behavioral: Positive for confusion.  All other systems reviewed and are negative.   PAST MEDICAL/SURGICAL HISTORY:  Past Medical History:  Diagnosis Date   Anemia    Anxiety    Chronic kidney disease    Chronic renal disease, stage 4, severely decreased glomerular filtration rate (GFR) between 15-29 mL/min/1.73 square meter ([place]) 0/92/3300   Complication of anesthesia    delirious after Breast Surgery   Dementia ([place])    mild   Depression    Diabetes mellitus without complication ([place])    type II   Dyspnea    with activity   GERD (gastroesophageal reflux disease)    Glaucoma    Hypertension    Pneumonia    Stage 1 infiltrating ductal carcinoma of right female breast ([facility]) 08/21/2015   ER+ PR+ HER 2 neu + (3+) T1cN0    Past Surgical History:  Procedure Laterality Date   AV FISTULA PLACEMENT Left 11/22/2017   Procedure: ARTERIOVENOUS (AV) FISTULA CREATION LEFT  ARM;  Surgeon: Elam Dutch, MD;  Location: [facility];  Service: Vascular;  Laterality: Left;   BIOPSY  08/07/2016   Procedure: BIOPSY;  Surgeon: Daneil Dolin, MD;  [facility];  Service: Endoscopy;;  gastric ulcer biopsy   COLONOSCOPY     ESOPHAGOGASTRODUODENOSCOPY N/A 08/07/2016   Procedure: ESOPHAGOGASTRODUODENOSCOPY (EGD);  Surgeon: Daneil Dolin, MD;  [facility];  Service: Endoscopy;  Laterality: N/A;  1215-rescheduled to 6/1 @ 2:30pm per Tretha Sciara   ESOPHAGOGASTRODUODENOSCOPY N/A 11/27/2016   Procedure: ESOPHAGOGASTRODUODENOSCOPY (EGD);  Surgeon: Daneil Dolin, MD;  [facility];  Service: Endoscopy;  Laterality: N/A;  8:15am   FISTULA SUPERFICIALIZATION Left 02/14/2018   Procedure: FISTULA SUPERFICIALIZATION LEFT ARM;  Surgeon: Angelia Mould, MD;  Location: [facility];  Service: Vascular;  Laterality: Left;   FRACTURE SURGERY Right    ankle   MALONEY DILATION N/A 08/07/2016   Procedure: Venia Minks DILATION;  Surgeon: Daneil Dolin, MD;  [facility];  Service: Endoscopy;  Laterality: N/A;   MASTECTOMY, PARTIAL Right    PERIPHERAL VASCULAR BALLOON ANGIOPLASTY Left 07/13/2019   Procedure: PERIPHERAL VASCULAR BALLOON ANGIOPLASTY;  Surgeon: Marty Heck, MD;  Location: [facility];  Service: Cardiovascular;  Laterality: Left;  arm fistulogram   PORT-A-CATH REMOVAL Left 11/22/2017   Procedure: REMOVAL PORT-A-CATH LEFT CHEST;  Surgeon: Elam Dutch, MD;  Location: [facility];  Service: Vascular;  Laterality: Left;   RETINAL DETACHMENT SURGERY Right     SOCIAL HISTORY:  Social History   Socioeconomic History   Marital status: Single    Spouse name: Not on file   Number of children: Not on file   Years of education: Not on file   Highest education level: Not on file  Occupational History   Not on file  Tobacco Use   Smoking status: Never Smoker   Smokeless tobacco: Never Used  Substance and Sexual  Activity   Alcohol use: No    Alcohol/week: 0.0 standard drinks   Drug use: No   Sexual activity: Not on file  Other Topics Concern   Not on file  Social History Narrative   Not on file   Social Determinants of Health   Financial Resource Strain:    Difficulty of Paying Living Expenses:   Food Insecurity:    Worried About Charity fundraiser in the Last Year:    Arboriculturist in the Last Year:   Transportation Needs:    Film/video editor (Medical):    Lack of Transportation (Non-Medical):   Physical Activity:    Days of Exercise per Week:    Minutes of Exercise per Session:   Stress:    Feeling of Stress :   Social Connections:    Frequency of Communication with Friends and Family:    Frequency of Social Gatherings with Friends and Family:    Attends Religious Services:    Active Member of Clubs or Organizations:    Attends Music therapist:    Marital Status:   Intimate Partner Violence:    Fear of Current or Ex-Partner:    Emotionally Abused:    Physically Abused:    Sexually Abused:     FAMILY HISTORY:  Family History  Problem Relation Age of Onset   Multiple myeloma Sister    Brain cancer Sister    Dementia Mother        died at 60   Stroke Mother    Heart failure Mother    Diabetes Mother    Heart disease Father    Prostate cancer Brother    Colon cancer Neg Hx     CURRENT MEDICATIONS:  Current Outpatient Medications  Medication Sig Dispense Refill   ACCU-CHEK AVIVA PLUS test strip      acetaminophen (TYLENOL) 500 MG tablet Take 500 mg by mouth every 6 (six) hours as needed for moderate pain or headache.      acyclovir (ZOVIRAX) 200 MG capsule TAKE 1 CAPSULE BY MOUTH TWICE DAILY (Patient taking differently: Take 200 mg by mouth 2 (two) times daily. ) 60 capsule 2   Alcohol Swabs (ALCOHOL PREP) 70 % PADS See admin instructions.  12   amLODipine (NORVASC) 10 MG tablet Take 10 mg by mouth daily.  3    anastrozole (ARIMIDEX) 1 MG tablet TAKE 1 TABLET BY MOUTH DAILY (Patient taking differently: Take 1 mg by mouth daily. ) 30 tablet 6   AQUALANCE LANCETS 30G MISC USE TO test twice daily  99   aspirin EC 81 MG tablet Take 81 mg by mouth daily.     bortezomib IV (VELCADE) 3.5 MG injection Inject into the vein once a week. Weekly every 21 days     calcitRIOL (ROCALTROL) 0.25 MCG capsule Take 0.25 mcg by mouth daily at 12 noon.      Calcium Carbonate-Vitamin D (CALCIUM 600+D PO) Take 1 tablet by mouth daily.  cloNIDine (CATAPRES) 0.3 MG tablet Take 0.3 mg by mouth 2 (two) times daily.      feeding supplement, GLUCERNA SHAKE, (GLUCERNA SHAKE) LIQD Take 237 mLs by mouth daily.     GLOBAL EASE INJECT PEN NEEDLES 31G X 8 MM MISC USE EVERY DAY AS DIRECTED     Lancet Devices (ADJUSTABLE LANCING DEVICE) MISC TO check blood glucose  1   Multiple Vitamin (MULTIVITAMIN) capsule Take 1 capsule by mouth daily.     ONGLYZA 5 MG TABS tablet Take 5 mg by mouth daily.  11   PARoxetine (PAXIL) 20 MG tablet Take 20 mg by mouth daily.      pioglitazone (ACTOS) 30 MG tablet Take 15 mg by mouth daily.      pravastatin (PRAVACHOL) 20 MG tablet Take 20 mg by mouth every evening.      TOUJEO SOLOSTAR 300 UNIT/ML SOPN Inject 5 Units into the skin every evening.      traZODone (DESYREL) 100 MG tablet Take 100 mg by mouth at bedtime.  2   diphenhydrAMINE (BENADRYL) 25 MG tablet Take 25 mg by mouth every 6 (six) hours as needed for allergies.      lidocaine-prilocaine (EMLA) cream Apply 1 application topically daily as needed (port access).      prochlorperazine (COMPAZINE) 10 MG tablet Take 10 mg by mouth every 6 (six) hours as needed for nausea or vomiting.      sodium chloride (OCEAN) 0.65 % SOLN nasal spray Place 1 spray into both nostrils as needed for congestion.     No current facility-administered medications for this visit.    ALLERGIES:  Allergies  Allergen Reactions   Penicillins  Other (See Comments)    Unsure of reaction Has patient had a PCN reaction causing immediate rash, facial/tongue/throat swelling, SOB or lightheadedness with hypotension: Unknown Has patient had a PCN reaction causing severe rash involving mucus membranes or skin necrosis: Unknown Has patient had a PCN reaction that required hospitalization: No Has patient had a PCN reaction occurring within the last 10 years: Unknown If all of the above answers are "NO", then may proceed with Cephalosporin use.     Ace Inhibitors Cough and Other (See Comments)    Tongue swell   Lisinopril Other (See Comments)    Tongue swell    PHYSICAL EXAM:  Performance status (ECOG): 2 - Symptomatic, <50% confined to bed  Vitals:   08/08/19 1110  BP: (!) 95/47  Pulse: 63  Resp: 18  Temp: (!) 96.6 F (35.9 C)  SpO2: 100%   Wt Readings from Last 3 Encounters:  08/08/19 139 lb (63 kg)  07/13/19 141 lb 1.5 oz (64 kg)  07/11/19 141 lb 3.2 oz (64 kg)   Physical Exam Vitals reviewed.  Constitutional:      Appearance: Normal appearance.  Cardiovascular:     Rate and Rhythm: Normal rate and regular rhythm.     Heart sounds: Normal heart sounds.  Pulmonary:     Effort: Pulmonary effort is normal.     Breath sounds: Normal breath sounds.  Musculoskeletal:     Right lower leg: Edema present.     Left lower leg: Edema present.  Neurological:     Mental Status: Carly [place] is alert.  Psychiatric:        Mood and Affect: Mood normal.        Behavior: Behavior normal.     LABORATORY DATA:  I have reviewed the labs as listed.  CBC Latest Ref Rng &  Units 08/08/2019 07/25/2019 07/18/2019  WBC 4.0 - 10.5 K/uL 4.8 5.4 4.4  Hemoglobin 12.0 - 15.0 g/dL 10.5(L) 11.3(L) 11.5(L)  Hematocrit 36.0 - 46.0 % 33.5(L) 35.5(L) 36.5  Platelets 150 - 400 K/uL 216 175 180   CMP Latest Ref Rng & Units 08/08/2019 07/25/2019 07/18/2019  Glucose 70 - 99 mg/dL 207(H) 214(H) 215(H)  BUN 8 - 23 mg/dL '22 23 20  ' Creatinine 0.44 - 1.00 mg/dL  4.23(H) 4.04(H) 3.99(H)  Sodium 135 - 145 mmol/L 139 136 135  Potassium 3.5 - 5.1 mmol/L 4.0 4.3 3.9  Chloride 98 - 111 mmol/L 98 93(L) 95(L)  CO2 22 - 32 mmol/L '29 30 30  ' Calcium 8.9 - 10.3 mg/dL 8.9 9.0 8.7(L)  Total Protein 6.5 - 8.1 g/dL 6.6 6.4(L) 6.2(L)  Total Bilirubin 0.3 - 1.2 mg/dL 0.3 0.3 0.3  Alkaline Phos 38 - 126 U/L 58 55 54  AST 15 - 41 U/L '24 25 24  ' ALT 0 - 44 U/L '13 14 10      ' Component Value Date/Time   RBC 3.19 (L) 08/08/2019 1027   MCV 105.0 (H) 08/08/2019 1027   MCH 32.9 08/08/2019 1027   MCHC 31.3 08/08/2019 1027   RDW 15.7 (H) 08/08/2019 1027   LYMPHSABS 0.9 08/08/2019 1027   MONOABS 0.3 08/08/2019 1027   EOSABS 0.1 08/08/2019 1027   BASOSABS 0.1 08/08/2019 1027    DIAGNOSTIC IMAGING:  I have independently reviewed the scans and discussed with the patient. PERIPHERAL VASCULAR CATHETERIZATION  Result Date: 07/13/2019 OPERATIVE NOTE   PROCEDURE: 1. left brachiocephalic arteriovenous fistula cannulation under ultrasound guidance 2. left arm fistulogram including central venogram 3. left cephalic venoplasty in upper arm (6 mm Mustang and 6 mm drug coated Ranger)  PRE-OPERATIVE DIAGNOSIS: Malfunctioning left arteriovenous fistula  POST-OPERATIVE DIAGNOSIS: same as above  SURGEON: Marty Heck, MD  ANESTHESIA: local  ESTIMATED BLOOD LOSS: 5 cc  FINDING(S): 1. Initially the left brachiocephalic fistula was pulsatile.  There is an aneurysmal segment with some small pseudoaneurysms in the mid arm.  Just proximal to this there was a focal high-grade stenosis with a second stenosis adjacent.  The vein appeared on the smaller side in the upper arm as well.  All these lesions were crossed and angioplastied with a 6 mm Mustang and 6 mm drug-coated Ranger.  Excellent thrill.  Less than 30% residual stenosis.  Did not want to  be more aggressive given significant pain and discomfort with 6 mm balloon.  SPECIMEN(S):  None  CONTRAST: 75 cc  INDICATIONS: Carly [place] is a 84 y.o. female who  presents with malfunctioning left arteriovenous fistula.  The patient is scheduled for left arm fistulogram.  The patient is aware the risks include but are not limited to: bleeding, infection, thrombosis of the cannulated access, and possible anaphylactic reaction to the contrast.  The patient is aware of the risks of the procedure and elects to proceed forward.  DESCRIPTION: After full informed written consent was obtained, the patient was brought back to the angiography suite and placed supine upon the angiography table.  The patient was connected to monitoring equipment.  The left arm was prepped and draped in the standard fashion for a left arm fistulogram.  Under ultrasound guidance, the left arteriovenous fistula was evaluated, it was patent, an image was saved.  It was cannulated with a micropuncture needle under ultrasound guidance. The microwire was advanced into the fistula and the needle was exchanged for the a microsheath, which was  lodged 2 cm into the access.  The wire was removed and the sheath was connected to the IV extension tubing.  Hand injections were completed to image the access from the antecubitum up to the level of axilla.  The central venous structures were also imaged by hand injections.  Ultimately elected to intervene on several tandem lesions in the upper arm fistula.  Patient was given 3000 units IV heparin.  I then used a Bentson wire and exchanged for a short 6 [place] sheath.  The lesions were crossed with assistance of a KMP catheter.  Initially used a 6 mm Mustang that was inflated to nominal pressure for 2 minutes.  Carly [place] had a lot of pain and discomfort with this and I did not want to be more aggressive and risk rupturing the fistula.  Then used a long 6 mm x 150 mm drug-coated Ranger over a [place] core wire to nominal pressure for 3 minutes.  Satisfied with the results with a much better thrill in the fistula versus pulsatile at the beginning the  case we elected no further intervention.  Wires and catheters were removed.  A 4-0 Monocryl purse-string suture was sewn around the sheath.  The sheath was removed while tying down the suture.  A sterile bandage was applied to the puncture site.  COMPLICATIONS: None  CONDITION: Stable  Marty Heck, MD [facility] Office: [phone]    ASSESSMENT:  1.  IgA lambda plasma cell myeloma, stage II, standard risk: -BMBX on 09/20/2018 shows plasma cell myeloma, 30% plasma cells.  FISH panel was normal.  Chromosome analysis normal. -Labs at diagnosis on 08/29/2018 with M spike 0.9 g.  Kappa light chains 148, lambda light chains 1132, ratio 0.13.  LDH normal.  Beta-2 microglobulin 13.5.  Carly [place] had transfusion dependent anemia. -Velcade and dexamethasone started on 10/11/2018. -Myeloma panel on 07/11/2019 shows SPEP is negative.  Free light chain ratio improved to 1.09.  Lambda light chains are 128, improved from 141.  Kappa light chains are 140.  2.  Stage I IDC of the right breast, ER/PR positive, HER-2 negative: -Carly [place] is on anastrozole.   PLAN:  1.  IgA lambda plasma cell myeloma: -We reviewed myeloma panel from 07/11/2019.  This is showing improvement. -I have reviewed her routine labs which are grossly within normal limits. -Carly [place] will continue weekly Velcade, dexamethasone 10 mg 3 weeks on 1 week off. -I will reevaluate her in 6 weeks with repeat labs.  2.  ESRD on HD: -Undergoes HD on Monday, Wednesday and Friday.  3.  Bone strengthening: -Bisphosphonates not started due to dialysis.  4.  Osteopenia: -Bone density on 05/16/2015 was osteopenic.  We plan to repeat scans soon.  5.  Right breast cancer: -Continue anastrozole.   Orders placed this encounter:  No orders of the defined types were placed in this encounter.    Derek Jack, MD [facility] [phone]   I, Jacqualyn Posey, am acting as a scribe for Dr. Sanda Linger.  I, Derek Jack MD, have reviewed the above documentation for accuracy and completeness, and I agree with the above.

## 2019-08-08 NOTE — Progress Notes (Signed)
1136 Labs reviewed with and pt seen by Dr. Delton Coombes and pt approved for Velcade injection today per MD                                  Wandra Arthurs [place] tolerated Velcade injection well without complaints or incident. Pt discharged via wheelchair in satisfactory condition accompanied by family member

## 2019-08-08 NOTE — Patient Instructions (Signed)
[facility] Discharge Instructions for Patients Receiving Chemotherapy   Beginning January 23rd 2017 lab work for the Cancer Center will be done in the  Main lab at [facility] on 1st floor. If you have a lab appointment with the Cancer Center please come in thru the  Main Entrance and check in at the main information desk   Today you received the following chemotherapy agents Velcade injection. Follow-up as scheduled. Call clinic for any questions or concerns  To help prevent nausea and vomiting after your treatment, we encourage you to take your nausea medication   If you develop nausea and vomiting, or diarrhea that is not controlled by your medication, call the clinic.  The clinic phone number is [phone]. Office hours are Monday-Friday 8:30am-5:00pm.  BELOW ARE SYMPTOMS THAT SHOULD BE REPORTED IMMEDIATELY:  *FEVER GREATER THAN 101.0 F  *CHILLS WITH OR WITHOUT FEVER  NAUSEA AND VOMITING THAT IS NOT CONTROLLED WITH YOUR NAUSEA MEDICATION  *UNUSUAL SHORTNESS OF BREATH  *UNUSUAL BRUISING OR BLEEDING  TENDERNESS IN MOUTH AND THROAT WITH OR WITHOUT PRESENCE OF ULCERS  *URINARY PROBLEMS  *BOWEL PROBLEMS  UNUSUAL RASH Items with * indicate a potential emergency and should be followed up as soon as possible. If you have an emergency after office hours please contact your primary care physician or go to the nearest emergency department.  Please call the clinic during office hours if you have any questions or concerns.   You may also contact the Patient Navigator at [phone] should you have any questions or need assistance in obtaining follow up care.      Resources For Cancer Patients and their Caregivers ? American Cancer Society: Can assist with transportation, wigs, general needs, runs Look Good Feel Better.        1-888-227-6333 ? Cancer Care: Provides financial assistance, online support groups, medication/co-pay assistance.   1-800-813-HOPE (4673) ? [facility] Assists [place] cancer patients and their families through emotional , educational and financial support.  336-427-4357 ? [place] DSS Where to apply for food stamps, Medicaid and utility assistance. 336-342-1394 ? [facility]: Transportation to medical appointments. 336-347-2287 ? Social Security Administration: May apply for disability if have a Stage IV cancer. 336-342-7796 1-800-772-1213 ? [place] Aging, Disability and Transit Services: Assists with nutrition, care and transit needs. 336-349-2343         

## 2019-08-09 DIAGNOSIS — C9000 Multiple myeloma not having achieved remission: Secondary | ICD-10-CM | POA: Diagnosis not present

## 2019-08-09 DIAGNOSIS — C50411 Malignant neoplasm of upper-outer quadrant of right female breast: Secondary | ICD-10-CM | POA: Diagnosis not present

## 2019-08-09 DIAGNOSIS — I129 Hypertensive chronic kidney disease with stage 1 through stage 4 chronic kidney disease, or unspecified chronic kidney disease: Secondary | ICD-10-CM | POA: Diagnosis not present

## 2019-08-09 DIAGNOSIS — Z170 Estrogen receptor positive status [ER+]: Secondary | ICD-10-CM | POA: Diagnosis not present

## 2019-08-09 DIAGNOSIS — D631 Anemia in chronic kidney disease: Secondary | ICD-10-CM | POA: Diagnosis not present

## 2019-08-09 DIAGNOSIS — N186 End stage renal disease: Secondary | ICD-10-CM | POA: Diagnosis not present

## 2019-08-09 DIAGNOSIS — E1122 Type 2 diabetes mellitus with diabetic chronic kidney disease: Secondary | ICD-10-CM | POA: Diagnosis not present

## 2019-08-09 DIAGNOSIS — N184 Chronic kidney disease, stage 4 (severe): Secondary | ICD-10-CM | POA: Diagnosis not present

## 2019-08-09 DIAGNOSIS — Z992 Dependence on renal dialysis: Secondary | ICD-10-CM | POA: Diagnosis not present

## 2019-08-09 DIAGNOSIS — F419 Anxiety disorder, unspecified: Secondary | ICD-10-CM | POA: Diagnosis not present

## 2019-08-09 LAB — PROTEIN ELECTROPHORESIS, SERUM
A/G Ratio: 1.1 (ref 0.7–1.7)
Albumin ELP: 3.2 g/dL (ref 2.9–4.4)
Alpha-1-Globulin: 0.2 g/dL (ref 0.0–0.4)
Alpha-2-Globulin: 0.9 g/dL (ref 0.4–1.0)
Beta Globulin: 0.9 g/dL (ref 0.7–1.3)
Gamma Globulin: 0.8 g/dL (ref 0.4–1.8)
Globulin, Total: 2.8 g/dL (ref 2.2–3.9)
Total Protein ELP: 6 g/dL (ref 6.0–8.5)

## 2019-08-09 LAB — KAPPA/LAMBDA LIGHT CHAINS
Kappa free light chain: 128.8 mg/L — ABNORMAL HIGH (ref 3.3–19.4)
Kappa, lambda light chain ratio: 1 (ref 0.26–1.65)
Lambda free light chains: 128.7 mg/L — ABNORMAL HIGH (ref 5.7–26.3)

## 2019-08-09 MED ORDER — OCTREOTIDE ACETATE 30 MG IM KIT
PACK | INTRAMUSCULAR | Status: AC
  Filled 2019-08-09: qty 1

## 2019-08-10 DIAGNOSIS — Z170 Estrogen receptor positive status [ER+]: Secondary | ICD-10-CM | POA: Diagnosis not present

## 2019-08-10 DIAGNOSIS — N184 Chronic kidney disease, stage 4 (severe): Secondary | ICD-10-CM | POA: Diagnosis not present

## 2019-08-10 DIAGNOSIS — I129 Hypertensive chronic kidney disease with stage 1 through stage 4 chronic kidney disease, or unspecified chronic kidney disease: Secondary | ICD-10-CM | POA: Diagnosis not present

## 2019-08-10 DIAGNOSIS — C50411 Malignant neoplasm of upper-outer quadrant of right female breast: Secondary | ICD-10-CM | POA: Diagnosis not present

## 2019-08-10 DIAGNOSIS — C9000 Multiple myeloma not having achieved remission: Secondary | ICD-10-CM | POA: Diagnosis not present

## 2019-08-10 DIAGNOSIS — Z992 Dependence on renal dialysis: Secondary | ICD-10-CM | POA: Diagnosis not present

## 2019-08-10 DIAGNOSIS — E1122 Type 2 diabetes mellitus with diabetic chronic kidney disease: Secondary | ICD-10-CM | POA: Diagnosis not present

## 2019-08-10 DIAGNOSIS — D631 Anemia in chronic kidney disease: Secondary | ICD-10-CM | POA: Diagnosis not present

## 2019-08-10 DIAGNOSIS — F419 Anxiety disorder, unspecified: Secondary | ICD-10-CM | POA: Diagnosis not present

## 2019-08-11 DIAGNOSIS — N186 End stage renal disease: Secondary | ICD-10-CM | POA: Diagnosis not present

## 2019-08-11 DIAGNOSIS — Z992 Dependence on renal dialysis: Secondary | ICD-10-CM | POA: Diagnosis not present

## 2019-08-14 DIAGNOSIS — Z992 Dependence on renal dialysis: Secondary | ICD-10-CM | POA: Diagnosis not present

## 2019-08-14 DIAGNOSIS — N186 End stage renal disease: Secondary | ICD-10-CM | POA: Diagnosis not present

## 2019-08-15 ENCOUNTER — Inpatient Hospital Stay (HOSPITAL_COMMUNITY): Payer: Medicare PPO

## 2019-08-15 ENCOUNTER — Other Ambulatory Visit: Payer: Self-pay

## 2019-08-15 VITALS — BP 103/57 | HR 63 | Temp 97.7°F | Resp 18 | Wt 139.6 lb

## 2019-08-15 DIAGNOSIS — Z5112 Encounter for antineoplastic immunotherapy: Secondary | ICD-10-CM | POA: Diagnosis not present

## 2019-08-15 DIAGNOSIS — Z992 Dependence on renal dialysis: Secondary | ICD-10-CM | POA: Diagnosis not present

## 2019-08-15 DIAGNOSIS — C9000 Multiple myeloma not having achieved remission: Secondary | ICD-10-CM

## 2019-08-15 DIAGNOSIS — E1122 Type 2 diabetes mellitus with diabetic chronic kidney disease: Secondary | ICD-10-CM | POA: Diagnosis not present

## 2019-08-15 DIAGNOSIS — N186 End stage renal disease: Secondary | ICD-10-CM | POA: Diagnosis not present

## 2019-08-15 DIAGNOSIS — C50911 Malignant neoplasm of unspecified site of right female breast: Secondary | ICD-10-CM | POA: Diagnosis not present

## 2019-08-15 DIAGNOSIS — I120 Hypertensive chronic kidney disease with stage 5 chronic kidney disease or end stage renal disease: Secondary | ICD-10-CM | POA: Diagnosis not present

## 2019-08-15 DIAGNOSIS — M8580 Other specified disorders of bone density and structure, unspecified site: Secondary | ICD-10-CM | POA: Diagnosis not present

## 2019-08-15 DIAGNOSIS — Z170 Estrogen receptor positive status [ER+]: Secondary | ICD-10-CM | POA: Diagnosis not present

## 2019-08-15 LAB — CBC WITH DIFFERENTIAL/PLATELET
Abs Immature Granulocytes: 0.02 10*3/uL (ref 0.00–0.07)
Basophils Absolute: 0 10*3/uL (ref 0.0–0.1)
Basophils Relative: 1 %
Eosinophils Absolute: 0.1 10*3/uL (ref 0.0–0.5)
Eosinophils Relative: 3 %
HCT: 31.9 % — ABNORMAL LOW (ref 36.0–46.0)
Hemoglobin: 10.2 g/dL — ABNORMAL LOW (ref 12.0–15.0)
Immature Granulocytes: 0 %
Lymphocytes Relative: 23 %
Lymphs Abs: 1.2 10*3/uL (ref 0.7–4.0)
MCH: 33.1 pg (ref 26.0–34.0)
MCHC: 32 g/dL (ref 30.0–36.0)
MCV: 103.6 fL — ABNORMAL HIGH (ref 80.0–100.0)
Monocytes Absolute: 0.3 10*3/uL (ref 0.1–1.0)
Monocytes Relative: 7 %
Neutro Abs: 3.4 10*3/uL (ref 1.7–7.7)
Neutrophils Relative %: 66 %
Platelets: 204 10*3/uL (ref 150–400)
RBC: 3.08 MIL/uL — ABNORMAL LOW (ref 3.87–5.11)
RDW: 15.5 % (ref 11.5–15.5)
WBC: 5.1 10*3/uL (ref 4.0–10.5)
nRBC: 0 % (ref 0.0–0.2)

## 2019-08-15 LAB — COMPREHENSIVE METABOLIC PANEL
ALT: 13 U/L (ref 0–44)
AST: 24 U/L (ref 15–41)
Albumin: 3.6 g/dL (ref 3.5–5.0)
Alkaline Phosphatase: 62 U/L (ref 38–126)
Anion gap: 7 (ref 5–15)
BUN: 23 mg/dL (ref 8–23)
CO2: 33 mmol/L — ABNORMAL HIGH (ref 22–32)
Calcium: 8.6 mg/dL — ABNORMAL LOW (ref 8.9–10.3)
Chloride: 96 mmol/L — ABNORMAL LOW (ref 98–111)
Creatinine, Ser: 4.38 mg/dL — ABNORMAL HIGH (ref 0.44–1.00)
GFR calc Af Amer: 10 mL/min — ABNORMAL LOW (ref >60)
GFR calc non Af Amer: 9 mL/min — ABNORMAL LOW (ref >60)
Glucose, Bld: 153 mg/dL — ABNORMAL HIGH (ref 70–99)
Potassium: 4.1 mmol/L (ref 3.5–5.1)
Sodium: 136 mmol/L (ref 135–145)
Total Bilirubin: 0.2 mg/dL — ABNORMAL LOW (ref 0.3–1.2)
Total Protein: 6.6 g/dL (ref 6.5–8.1)

## 2019-08-15 MED ORDER — DEXAMETHASONE 4 MG PO TABS
10.0000 mg | ORAL_TABLET | Freq: Once | ORAL | Status: AC
  Administered 2019-08-15: 10 mg via ORAL
  Filled 2019-08-15: qty 3

## 2019-08-15 MED ORDER — BORTEZOMIB CHEMO SQ INJECTION 3.5 MG (2.5MG/ML)
1.3000 mg/m2 | Freq: Once | INTRAMUSCULAR | Status: AC
  Administered 2019-08-15: 2.25 mg via SUBCUTANEOUS
  Filled 2019-08-15: qty 0.9

## 2019-08-15 MED ORDER — PROCHLORPERAZINE MALEATE 10 MG PO TABS
10.0000 mg | ORAL_TABLET | Freq: Once | ORAL | Status: AC
  Administered 2019-08-15: 10 mg via ORAL
  Filled 2019-08-15: qty 1

## 2019-08-15 NOTE — Patient Instructions (Signed)
[facility] Discharge Instructions for Patients Receiving Chemotherapy   Beginning January 23rd 2017 lab work for the Cancer Center will be done in the  Main lab at [facility] on 1st floor. If you have a lab appointment with the Cancer Center please come in thru the  Main Entrance and check in at the main information desk   Today you received the following chemotherapy agents Velcade  To help prevent nausea and vomiting after your treatment, we encourage you to take your nausea medication    If you develop nausea and vomiting, or diarrhea that is not controlled by your medication, call the clinic.  The clinic phone number is [phone]. Office hours are Monday-Friday 8:30am-5:00pm.  BELOW ARE SYMPTOMS THAT SHOULD BE REPORTED IMMEDIATELY:  *FEVER GREATER THAN 101.0 F  *CHILLS WITH OR WITHOUT FEVER  NAUSEA AND VOMITING THAT IS NOT CONTROLLED WITH YOUR NAUSEA MEDICATION  *UNUSUAL SHORTNESS OF BREATH  *UNUSUAL BRUISING OR BLEEDING  TENDERNESS IN MOUTH AND THROAT WITH OR WITHOUT PRESENCE OF ULCERS  *URINARY PROBLEMS  *BOWEL PROBLEMS  UNUSUAL RASH Items with * indicate a potential emergency and should be followed up as soon as possible. If you have an emergency after office hours please contact your primary care physician or go to the nearest emergency department.  Please call the clinic during office hours if you have any questions or concerns.   You may also contact the Patient Navigator at [phone] should you have any questions or need assistance in obtaining follow up care.      Resources For Cancer Patients and their Caregivers ? American Cancer Society: Can assist with transportation, wigs, general needs, runs Look Good Feel Better.        1-888-227-6333 ? Cancer Care: Provides financial assistance, online support groups, medication/co-pay assistance.  1-800-813-HOPE (4673) ? [facility] Assists [place] cancer  patients and their families through emotional , educational and financial support.  336-427-4357 ? [place] DSS Where to apply for food stamps, Medicaid and utility assistance. 336-342-1394 ? [facility]: Transportation to medical appointments. 336-347-2287 ? Social Security Administration: May apply for disability if have a Stage IV cancer. 336-342-7796 1-800-772-1213 ? [place] Aging, Disability and Transit Services: Assists with nutrition, care and transit needs. 336-349-2343          

## 2019-08-15 NOTE — Progress Notes (Signed)
Carly [place] presents today for Velcade injection. Pt reports no changes or new symptoms since last week. Vitals and lab results stable. Cr 4.38 today, per parameters, ok to treat unless Cr 5.8. Proceed with treatment today.  Injection tolerated into left abdomen without incident or complaint. Site clean and dry, bandaid applied. Discharged in satisfactory condition with follow up instructions.

## 2019-08-16 DIAGNOSIS — N186 End stage renal disease: Secondary | ICD-10-CM | POA: Diagnosis not present

## 2019-08-16 DIAGNOSIS — Z992 Dependence on renal dialysis: Secondary | ICD-10-CM | POA: Diagnosis not present

## 2019-08-16 MED ORDER — PEGFILGRASTIM-JMDB 6 MG/0.6ML ~~LOC~~ SOSY
PREFILLED_SYRINGE | SUBCUTANEOUS | Status: AC
  Filled 2019-08-16: qty 0.6

## 2019-08-17 DIAGNOSIS — E1122 Type 2 diabetes mellitus with diabetic chronic kidney disease: Secondary | ICD-10-CM | POA: Diagnosis not present

## 2019-08-17 DIAGNOSIS — C9000 Multiple myeloma not having achieved remission: Secondary | ICD-10-CM | POA: Diagnosis not present

## 2019-08-17 DIAGNOSIS — D631 Anemia in chronic kidney disease: Secondary | ICD-10-CM | POA: Diagnosis not present

## 2019-08-17 DIAGNOSIS — I129 Hypertensive chronic kidney disease with stage 1 through stage 4 chronic kidney disease, or unspecified chronic kidney disease: Secondary | ICD-10-CM | POA: Diagnosis not present

## 2019-08-17 DIAGNOSIS — C50411 Malignant neoplasm of upper-outer quadrant of right female breast: Secondary | ICD-10-CM | POA: Diagnosis not present

## 2019-08-17 DIAGNOSIS — N184 Chronic kidney disease, stage 4 (severe): Secondary | ICD-10-CM | POA: Diagnosis not present

## 2019-08-17 DIAGNOSIS — Z170 Estrogen receptor positive status [ER+]: Secondary | ICD-10-CM | POA: Diagnosis not present

## 2019-08-17 DIAGNOSIS — F419 Anxiety disorder, unspecified: Secondary | ICD-10-CM | POA: Diagnosis not present

## 2019-08-17 DIAGNOSIS — Z992 Dependence on renal dialysis: Secondary | ICD-10-CM | POA: Diagnosis not present

## 2019-08-18 DIAGNOSIS — N186 End stage renal disease: Secondary | ICD-10-CM | POA: Diagnosis not present

## 2019-08-18 DIAGNOSIS — Z992 Dependence on renal dialysis: Secondary | ICD-10-CM | POA: Diagnosis not present

## 2019-08-21 DIAGNOSIS — Z992 Dependence on renal dialysis: Secondary | ICD-10-CM | POA: Diagnosis not present

## 2019-08-21 DIAGNOSIS — N186 End stage renal disease: Secondary | ICD-10-CM | POA: Diagnosis not present

## 2019-08-22 ENCOUNTER — Inpatient Hospital Stay (HOSPITAL_COMMUNITY): Payer: Medicare PPO

## 2019-08-22 ENCOUNTER — Other Ambulatory Visit: Payer: Self-pay

## 2019-08-22 ENCOUNTER — Encounter (HOSPITAL_COMMUNITY): Payer: Self-pay

## 2019-08-22 VITALS — BP 111/50 | HR 59 | Temp 97.0°F | Resp 18 | Wt 140.2 lb

## 2019-08-22 DIAGNOSIS — I120 Hypertensive chronic kidney disease with stage 5 chronic kidney disease or end stage renal disease: Secondary | ICD-10-CM | POA: Diagnosis not present

## 2019-08-22 DIAGNOSIS — Z992 Dependence on renal dialysis: Secondary | ICD-10-CM | POA: Diagnosis not present

## 2019-08-22 DIAGNOSIS — Z170 Estrogen receptor positive status [ER+]: Secondary | ICD-10-CM | POA: Diagnosis not present

## 2019-08-22 DIAGNOSIS — C9000 Multiple myeloma not having achieved remission: Secondary | ICD-10-CM | POA: Diagnosis not present

## 2019-08-22 DIAGNOSIS — E1122 Type 2 diabetes mellitus with diabetic chronic kidney disease: Secondary | ICD-10-CM | POA: Diagnosis not present

## 2019-08-22 DIAGNOSIS — M8580 Other specified disorders of bone density and structure, unspecified site: Secondary | ICD-10-CM | POA: Diagnosis not present

## 2019-08-22 DIAGNOSIS — Z5112 Encounter for antineoplastic immunotherapy: Secondary | ICD-10-CM | POA: Diagnosis not present

## 2019-08-22 DIAGNOSIS — N186 End stage renal disease: Secondary | ICD-10-CM | POA: Diagnosis not present

## 2019-08-22 DIAGNOSIS — C50911 Malignant neoplasm of unspecified site of right female breast: Secondary | ICD-10-CM | POA: Diagnosis not present

## 2019-08-22 LAB — COMPREHENSIVE METABOLIC PANEL
ALT: 14 U/L (ref 0–44)
AST: 25 U/L (ref 15–41)
Albumin: 3.4 g/dL — ABNORMAL LOW (ref 3.5–5.0)
Alkaline Phosphatase: 57 U/L (ref 38–126)
Anion gap: 8 (ref 5–15)
BUN: 24 mg/dL — ABNORMAL HIGH (ref 8–23)
CO2: 32 mmol/L (ref 22–32)
Calcium: 8.7 mg/dL — ABNORMAL LOW (ref 8.9–10.3)
Chloride: 97 mmol/L — ABNORMAL LOW (ref 98–111)
Creatinine, Ser: 3.87 mg/dL — ABNORMAL HIGH (ref 0.44–1.00)
GFR calc Af Amer: 12 mL/min — ABNORMAL LOW (ref >60)
GFR calc non Af Amer: 10 mL/min — ABNORMAL LOW (ref >60)
Glucose, Bld: 128 mg/dL — ABNORMAL HIGH (ref 70–99)
Potassium: 4.3 mmol/L (ref 3.5–5.1)
Sodium: 137 mmol/L (ref 135–145)
Total Bilirubin: 0.4 mg/dL (ref 0.3–1.2)
Total Protein: 6.4 g/dL — ABNORMAL LOW (ref 6.5–8.1)

## 2019-08-22 LAB — CBC WITH DIFFERENTIAL/PLATELET
Abs Immature Granulocytes: 0.03 10*3/uL (ref 0.00–0.07)
Basophils Absolute: 0 10*3/uL (ref 0.0–0.1)
Basophils Relative: 1 %
Eosinophils Absolute: 0.2 10*3/uL (ref 0.0–0.5)
Eosinophils Relative: 3 %
HCT: 29.8 % — ABNORMAL LOW (ref 36.0–46.0)
Hemoglobin: 9.5 g/dL — ABNORMAL LOW (ref 12.0–15.0)
Immature Granulocytes: 0 %
Lymphocytes Relative: 16 %
Lymphs Abs: 1.1 10*3/uL (ref 0.7–4.0)
MCH: 33.5 pg (ref 26.0–34.0)
MCHC: 31.9 g/dL (ref 30.0–36.0)
MCV: 104.9 fL — ABNORMAL HIGH (ref 80.0–100.0)
Monocytes Absolute: 0.5 10*3/uL (ref 0.1–1.0)
Monocytes Relative: 8 %
Neutro Abs: 5 10*3/uL (ref 1.7–7.7)
Neutrophils Relative %: 72 %
Platelets: 181 10*3/uL (ref 150–400)
RBC: 2.84 MIL/uL — ABNORMAL LOW (ref 3.87–5.11)
RDW: 15.7 % — ABNORMAL HIGH (ref 11.5–15.5)
WBC: 6.9 10*3/uL (ref 4.0–10.5)
nRBC: 0 % (ref 0.0–0.2)

## 2019-08-22 MED ORDER — BORTEZOMIB CHEMO SQ INJECTION 3.5 MG (2.5MG/ML)
1.3000 mg/m2 | Freq: Once | INTRAMUSCULAR | Status: AC
  Administered 2019-08-22: 2.25 mg via SUBCUTANEOUS
  Filled 2019-08-22: qty 0.9

## 2019-08-22 MED ORDER — PROCHLORPERAZINE MALEATE 10 MG PO TABS
10.0000 mg | ORAL_TABLET | Freq: Once | ORAL | Status: AC
  Administered 2019-08-22: 10 mg via ORAL
  Filled 2019-08-22: qty 1

## 2019-08-22 MED ORDER — DEXAMETHASONE 4 MG PO TABS
10.0000 mg | ORAL_TABLET | Freq: Once | ORAL | Status: AC
  Administered 2019-08-22: 10 mg via ORAL
  Filled 2019-08-22: qty 3

## 2019-08-22 NOTE — Progress Notes (Signed)
[place] reviewed with Dr. Delton Coombes and pt approved for Velcade injection today per MD                                                       Carly [place] tolerated Velcade injection well without complaints or incident. Pt discharged via wheelchair in satisfactory condition accompanied by her sister

## 2019-08-22 NOTE — Patient Instructions (Signed)
[facility] Discharge Instructions for Patients Receiving Chemotherapy   Beginning January 23rd 2017 lab work for the Cancer Center will be done in the  Main lab at [facility] on 1st floor. If you have a lab appointment with the Cancer Center please come in thru the  Main Entrance and check in at the main information desk   Today you received the following chemotherapy agents Velcade. Follow-up as scheduled. Call clinic for any questions or concerns  To help prevent nausea and vomiting after your treatment, we encourage you to take your nausea medication   If you develop nausea and vomiting, or diarrhea that is not controlled by your medication, call the clinic.  The clinic phone number is [phone]. Office hours are Monday-Friday 8:30am-5:00pm.  BELOW ARE SYMPTOMS THAT SHOULD BE REPORTED IMMEDIATELY:  *FEVER GREATER THAN 101.0 F  *CHILLS WITH OR WITHOUT FEVER  NAUSEA AND VOMITING THAT IS NOT CONTROLLED WITH YOUR NAUSEA MEDICATION  *UNUSUAL SHORTNESS OF BREATH  *UNUSUAL BRUISING OR BLEEDING  TENDERNESS IN MOUTH AND THROAT WITH OR WITHOUT PRESENCE OF ULCERS  *URINARY PROBLEMS  *BOWEL PROBLEMS  UNUSUAL RASH Items with * indicate a potential emergency and should be followed up as soon as possible. If you have an emergency after office hours please contact your primary care physician or go to the nearest emergency department.  Please call the clinic during office hours if you have any questions or concerns.   You may also contact the Patient Navigator at [phone] should you have any questions or need assistance in obtaining follow up care.      Resources For Cancer Patients and their Caregivers ? American Cancer Society: Can assist with transportation, wigs, general needs, runs Look Good Feel Better.        1-888-227-6333 ? Cancer Care: Provides financial assistance, online support groups, medication/co-pay assistance.  1-800-813-HOPE  (4673) ? [facility] Assists [place] cancer patients and their families through emotional , educational and financial support.  336-427-4357 ? [place] DSS Where to apply for food stamps, Medicaid and utility assistance. 336-342-1394 ? [facility]: Transportation to medical appointments. 336-347-2287 ? Social Security Administration: May apply for disability if have a Stage IV cancer. 336-342-7796 1-800-772-1213 ? [place] Aging, Disability and Transit Services: Assists with nutrition, care and transit needs. 336-349-2343         

## 2019-08-23 DIAGNOSIS — N184 Chronic kidney disease, stage 4 (severe): Secondary | ICD-10-CM | POA: Diagnosis not present

## 2019-08-23 DIAGNOSIS — E1122 Type 2 diabetes mellitus with diabetic chronic kidney disease: Secondary | ICD-10-CM | POA: Diagnosis not present

## 2019-08-23 DIAGNOSIS — D631 Anemia in chronic kidney disease: Secondary | ICD-10-CM | POA: Diagnosis not present

## 2019-08-23 DIAGNOSIS — Z992 Dependence on renal dialysis: Secondary | ICD-10-CM | POA: Diagnosis not present

## 2019-08-23 DIAGNOSIS — C50411 Malignant neoplasm of upper-outer quadrant of right female breast: Secondary | ICD-10-CM | POA: Diagnosis not present

## 2019-08-23 DIAGNOSIS — N186 End stage renal disease: Secondary | ICD-10-CM | POA: Diagnosis not present

## 2019-08-23 DIAGNOSIS — C9000 Multiple myeloma not having achieved remission: Secondary | ICD-10-CM | POA: Diagnosis not present

## 2019-08-23 DIAGNOSIS — I129 Hypertensive chronic kidney disease with stage 1 through stage 4 chronic kidney disease, or unspecified chronic kidney disease: Secondary | ICD-10-CM | POA: Diagnosis not present

## 2019-08-23 DIAGNOSIS — Z170 Estrogen receptor positive status [ER+]: Secondary | ICD-10-CM | POA: Diagnosis not present

## 2019-08-23 DIAGNOSIS — F419 Anxiety disorder, unspecified: Secondary | ICD-10-CM | POA: Diagnosis not present

## 2019-08-24 DIAGNOSIS — D631 Anemia in chronic kidney disease: Secondary | ICD-10-CM | POA: Diagnosis not present

## 2019-08-24 DIAGNOSIS — E1122 Type 2 diabetes mellitus with diabetic chronic kidney disease: Secondary | ICD-10-CM | POA: Diagnosis not present

## 2019-08-24 DIAGNOSIS — C50411 Malignant neoplasm of upper-outer quadrant of right female breast: Secondary | ICD-10-CM | POA: Diagnosis not present

## 2019-08-24 DIAGNOSIS — I129 Hypertensive chronic kidney disease with stage 1 through stage 4 chronic kidney disease, or unspecified chronic kidney disease: Secondary | ICD-10-CM | POA: Diagnosis not present

## 2019-08-24 DIAGNOSIS — Z992 Dependence on renal dialysis: Secondary | ICD-10-CM | POA: Diagnosis not present

## 2019-08-24 DIAGNOSIS — N184 Chronic kidney disease, stage 4 (severe): Secondary | ICD-10-CM | POA: Diagnosis not present

## 2019-08-24 DIAGNOSIS — F419 Anxiety disorder, unspecified: Secondary | ICD-10-CM | POA: Diagnosis not present

## 2019-08-24 DIAGNOSIS — Z170 Estrogen receptor positive status [ER+]: Secondary | ICD-10-CM | POA: Diagnosis not present

## 2019-08-24 DIAGNOSIS — C9000 Multiple myeloma not having achieved remission: Secondary | ICD-10-CM | POA: Diagnosis not present

## 2019-08-25 DIAGNOSIS — Z992 Dependence on renal dialysis: Secondary | ICD-10-CM | POA: Diagnosis not present

## 2019-08-25 DIAGNOSIS — N186 End stage renal disease: Secondary | ICD-10-CM | POA: Diagnosis not present

## 2019-08-28 DIAGNOSIS — N186 End stage renal disease: Secondary | ICD-10-CM | POA: Diagnosis not present

## 2019-08-28 DIAGNOSIS — Z992 Dependence on renal dialysis: Secondary | ICD-10-CM | POA: Diagnosis not present

## 2019-08-29 DIAGNOSIS — I129 Hypertensive chronic kidney disease with stage 1 through stage 4 chronic kidney disease, or unspecified chronic kidney disease: Secondary | ICD-10-CM | POA: Diagnosis not present

## 2019-08-29 DIAGNOSIS — C9000 Multiple myeloma not having achieved remission: Secondary | ICD-10-CM | POA: Diagnosis not present

## 2019-08-29 DIAGNOSIS — E1122 Type 2 diabetes mellitus with diabetic chronic kidney disease: Secondary | ICD-10-CM | POA: Diagnosis not present

## 2019-08-29 DIAGNOSIS — Z992 Dependence on renal dialysis: Secondary | ICD-10-CM | POA: Diagnosis not present

## 2019-08-29 DIAGNOSIS — Z170 Estrogen receptor positive status [ER+]: Secondary | ICD-10-CM | POA: Diagnosis not present

## 2019-08-29 DIAGNOSIS — D631 Anemia in chronic kidney disease: Secondary | ICD-10-CM | POA: Diagnosis not present

## 2019-08-29 DIAGNOSIS — F419 Anxiety disorder, unspecified: Secondary | ICD-10-CM | POA: Diagnosis not present

## 2019-08-29 DIAGNOSIS — N184 Chronic kidney disease, stage 4 (severe): Secondary | ICD-10-CM | POA: Diagnosis not present

## 2019-08-29 DIAGNOSIS — C50411 Malignant neoplasm of upper-outer quadrant of right female breast: Secondary | ICD-10-CM | POA: Diagnosis not present

## 2019-08-30 DIAGNOSIS — Z992 Dependence on renal dialysis: Secondary | ICD-10-CM | POA: Diagnosis not present

## 2019-08-30 DIAGNOSIS — N186 End stage renal disease: Secondary | ICD-10-CM | POA: Diagnosis not present

## 2019-08-31 DIAGNOSIS — E1122 Type 2 diabetes mellitus with diabetic chronic kidney disease: Secondary | ICD-10-CM | POA: Diagnosis not present

## 2019-08-31 DIAGNOSIS — C9000 Multiple myeloma not having achieved remission: Secondary | ICD-10-CM | POA: Diagnosis not present

## 2019-08-31 DIAGNOSIS — C50411 Malignant neoplasm of upper-outer quadrant of right female breast: Secondary | ICD-10-CM | POA: Diagnosis not present

## 2019-08-31 DIAGNOSIS — F419 Anxiety disorder, unspecified: Secondary | ICD-10-CM | POA: Diagnosis not present

## 2019-08-31 DIAGNOSIS — N184 Chronic kidney disease, stage 4 (severe): Secondary | ICD-10-CM | POA: Diagnosis not present

## 2019-08-31 DIAGNOSIS — D631 Anemia in chronic kidney disease: Secondary | ICD-10-CM | POA: Diagnosis not present

## 2019-08-31 DIAGNOSIS — I129 Hypertensive chronic kidney disease with stage 1 through stage 4 chronic kidney disease, or unspecified chronic kidney disease: Secondary | ICD-10-CM | POA: Diagnosis not present

## 2019-08-31 DIAGNOSIS — Z992 Dependence on renal dialysis: Secondary | ICD-10-CM | POA: Diagnosis not present

## 2019-08-31 DIAGNOSIS — Z170 Estrogen receptor positive status [ER+]: Secondary | ICD-10-CM | POA: Diagnosis not present

## 2019-09-01 DIAGNOSIS — Z992 Dependence on renal dialysis: Secondary | ICD-10-CM | POA: Diagnosis not present

## 2019-09-01 DIAGNOSIS — E1122 Type 2 diabetes mellitus with diabetic chronic kidney disease: Secondary | ICD-10-CM | POA: Diagnosis not present

## 2019-09-01 DIAGNOSIS — Z170 Estrogen receptor positive status [ER+]: Secondary | ICD-10-CM | POA: Diagnosis not present

## 2019-09-01 DIAGNOSIS — F419 Anxiety disorder, unspecified: Secondary | ICD-10-CM | POA: Diagnosis not present

## 2019-09-01 DIAGNOSIS — D631 Anemia in chronic kidney disease: Secondary | ICD-10-CM | POA: Diagnosis not present

## 2019-09-01 DIAGNOSIS — C9000 Multiple myeloma not having achieved remission: Secondary | ICD-10-CM | POA: Diagnosis not present

## 2019-09-01 DIAGNOSIS — N186 End stage renal disease: Secondary | ICD-10-CM | POA: Diagnosis not present

## 2019-09-01 DIAGNOSIS — N184 Chronic kidney disease, stage 4 (severe): Secondary | ICD-10-CM | POA: Diagnosis not present

## 2019-09-01 DIAGNOSIS — I129 Hypertensive chronic kidney disease with stage 1 through stage 4 chronic kidney disease, or unspecified chronic kidney disease: Secondary | ICD-10-CM | POA: Diagnosis not present

## 2019-09-01 DIAGNOSIS — C50411 Malignant neoplasm of upper-outer quadrant of right female breast: Secondary | ICD-10-CM | POA: Diagnosis not present

## 2019-09-04 DIAGNOSIS — E119 Type 2 diabetes mellitus without complications: Secondary | ICD-10-CM | POA: Diagnosis not present

## 2019-09-04 DIAGNOSIS — N186 End stage renal disease: Secondary | ICD-10-CM | POA: Diagnosis not present

## 2019-09-04 DIAGNOSIS — Z992 Dependence on renal dialysis: Secondary | ICD-10-CM | POA: Diagnosis not present

## 2019-09-05 ENCOUNTER — Encounter (HOSPITAL_COMMUNITY): Payer: Self-pay

## 2019-09-05 ENCOUNTER — Inpatient Hospital Stay (HOSPITAL_COMMUNITY): Payer: Medicare PPO

## 2019-09-05 ENCOUNTER — Other Ambulatory Visit: Payer: Self-pay

## 2019-09-05 VITALS — BP 93/44 | HR 58 | Temp 98.4°F | Resp 18

## 2019-09-05 DIAGNOSIS — E1122 Type 2 diabetes mellitus with diabetic chronic kidney disease: Secondary | ICD-10-CM | POA: Diagnosis not present

## 2019-09-05 DIAGNOSIS — C9000 Multiple myeloma not having achieved remission: Secondary | ICD-10-CM | POA: Diagnosis not present

## 2019-09-05 DIAGNOSIS — Z5112 Encounter for antineoplastic immunotherapy: Secondary | ICD-10-CM | POA: Diagnosis not present

## 2019-09-05 DIAGNOSIS — Z992 Dependence on renal dialysis: Secondary | ICD-10-CM | POA: Diagnosis not present

## 2019-09-05 DIAGNOSIS — I120 Hypertensive chronic kidney disease with stage 5 chronic kidney disease or end stage renal disease: Secondary | ICD-10-CM | POA: Diagnosis not present

## 2019-09-05 DIAGNOSIS — Z170 Estrogen receptor positive status [ER+]: Secondary | ICD-10-CM | POA: Diagnosis not present

## 2019-09-05 DIAGNOSIS — C50911 Malignant neoplasm of unspecified site of right female breast: Secondary | ICD-10-CM | POA: Diagnosis not present

## 2019-09-05 DIAGNOSIS — M8580 Other specified disorders of bone density and structure, unspecified site: Secondary | ICD-10-CM | POA: Diagnosis not present

## 2019-09-05 DIAGNOSIS — N186 End stage renal disease: Secondary | ICD-10-CM | POA: Diagnosis not present

## 2019-09-05 LAB — COMPREHENSIVE METABOLIC PANEL
ALT: 11 U/L (ref 0–44)
AST: 23 U/L (ref 15–41)
Albumin: 3.6 g/dL (ref 3.5–5.0)
Alkaline Phosphatase: 60 U/L (ref 38–126)
Anion gap: 11 (ref 5–15)
BUN: 22 mg/dL (ref 8–23)
CO2: 31 mmol/L (ref 22–32)
Calcium: 9 mg/dL (ref 8.9–10.3)
Chloride: 96 mmol/L — ABNORMAL LOW (ref 98–111)
Creatinine, Ser: 4.29 mg/dL — ABNORMAL HIGH (ref 0.44–1.00)
GFR calc Af Amer: 11 mL/min — ABNORMAL LOW (ref >60)
GFR calc non Af Amer: 9 mL/min — ABNORMAL LOW (ref >60)
Glucose, Bld: 136 mg/dL — ABNORMAL HIGH (ref 70–99)
Potassium: 4.2 mmol/L (ref 3.5–5.1)
Sodium: 138 mmol/L (ref 135–145)
Total Bilirubin: 0.5 mg/dL (ref 0.3–1.2)
Total Protein: 6.6 g/dL (ref 6.5–8.1)

## 2019-09-05 LAB — CBC WITH DIFFERENTIAL/PLATELET
Abs Immature Granulocytes: 0.02 10*3/uL (ref 0.00–0.07)
Basophils Absolute: 0 10*3/uL (ref 0.0–0.1)
Basophils Relative: 1 %
Eosinophils Absolute: 0.2 10*3/uL (ref 0.0–0.5)
Eosinophils Relative: 4 %
HCT: 27.9 % — ABNORMAL LOW (ref 36.0–46.0)
Hemoglobin: 8.8 g/dL — ABNORMAL LOW (ref 12.0–15.0)
Immature Granulocytes: 0 %
Lymphocytes Relative: 20 %
Lymphs Abs: 1.1 10*3/uL (ref 0.7–4.0)
MCH: 33.1 pg (ref 26.0–34.0)
MCHC: 31.5 g/dL (ref 30.0–36.0)
MCV: 104.9 fL — ABNORMAL HIGH (ref 80.0–100.0)
Monocytes Absolute: 0.4 10*3/uL (ref 0.1–1.0)
Monocytes Relative: 8 %
Neutro Abs: 3.6 10*3/uL (ref 1.7–7.7)
Neutrophils Relative %: 67 %
Platelets: 209 10*3/uL (ref 150–400)
RBC: 2.66 MIL/uL — ABNORMAL LOW (ref 3.87–5.11)
RDW: 15.8 % — ABNORMAL HIGH (ref 11.5–15.5)
WBC: 5.3 10*3/uL (ref 4.0–10.5)
nRBC: 0 % (ref 0.0–0.2)

## 2019-09-05 MED ORDER — BORTEZOMIB CHEMO SQ INJECTION 3.5 MG (2.5MG/ML)
1.3000 mg/m2 | Freq: Once | INTRAMUSCULAR | Status: AC
  Administered 2019-09-05: 2.25 mg via SUBCUTANEOUS
  Filled 2019-09-05: qty 0.9

## 2019-09-05 MED ORDER — DEXAMETHASONE 4 MG PO TABS
10.0000 mg | ORAL_TABLET | Freq: Once | ORAL | Status: AC
  Administered 2019-09-05: 10 mg via ORAL
  Filled 2019-09-05: qty 3

## 2019-09-05 MED ORDER — PROCHLORPERAZINE MALEATE 10 MG PO TABS
10.0000 mg | ORAL_TABLET | Freq: Once | ORAL | Status: AC
  Administered 2019-09-05: 10 mg via ORAL
  Filled 2019-09-05: qty 1

## 2019-09-05 NOTE — Progress Notes (Signed)
Godwin reviewed with RLockamy NP and pt approved for Velcade injection today per NP                                                        Carly Wood tolerated Velcade injection well without complaints or incident. VSS Pt discharged via wheelchair in satisfactory condition accompanied by her sisters

## 2019-09-05 NOTE — Patient Instructions (Signed)
[facility] Discharge Instructions for Patients Receiving Chemotherapy   Beginning January 23rd 2017 lab work for the Cancer Center will be done in the  Main lab at [facility] on 1st floor. If you have a lab appointment with the Cancer Center please come in thru the  Main Entrance and check in at the main information desk   Today you received the following chemotherapy agents Velcade injection. Follow-up as scheduled  To help prevent nausea and vomiting after your treatment, we encourage you to take your nausea medication   If you develop nausea and vomiting, or diarrhea that is not controlled by your medication, call the clinic.  The clinic phone number is [phone]. Office hours are Monday-Friday 8:30am-5:00pm.  BELOW ARE SYMPTOMS THAT SHOULD BE REPORTED IMMEDIATELY:  *FEVER GREATER THAN 101.0 F  *CHILLS WITH OR WITHOUT FEVER  NAUSEA AND VOMITING THAT IS NOT CONTROLLED WITH YOUR NAUSEA MEDICATION  *UNUSUAL SHORTNESS OF BREATH  *UNUSUAL BRUISING OR BLEEDING  TENDERNESS IN MOUTH AND THROAT WITH OR WITHOUT PRESENCE OF ULCERS  *URINARY PROBLEMS  *BOWEL PROBLEMS  UNUSUAL RASH Items with * indicate a potential emergency and should be followed up as soon as possible. If you have an emergency after office hours please contact your primary care physician or go to the nearest emergency department.  Please call the clinic during office hours if you have any questions or concerns.   You may also contact the Patient Navigator at [phone] should you have any questions or need assistance in obtaining follow up care.      Resources For Cancer Patients and their Caregivers ? American Cancer Society: Can assist with transportation, wigs, general needs, runs Look Good Feel Better.        1-888-227-6333 ? Cancer Care: Provides financial assistance, online support groups, medication/co-pay assistance.  1-800-813-HOPE (4673) ? [facility] Assists [place] cancer patients and their families through emotional , educational and financial support.  336-427-4357 ? [place] DSS Where to apply for food stamps, Medicaid and utility assistance. 336-342-1394 ? [facility]: Transportation to medical appointments. 336-347-2287 ? Social Security Administration: May apply for disability if have a Stage IV cancer. 336-342-7796 1-800-772-1213 ? [place] Aging, Disability and Transit Services: Assists with nutrition, care and transit needs. 336-349-2343         

## 2019-09-06 DIAGNOSIS — Z992 Dependence on renal dialysis: Secondary | ICD-10-CM | POA: Diagnosis not present

## 2019-09-06 DIAGNOSIS — E7800 Pure hypercholesterolemia, unspecified: Secondary | ICD-10-CM | POA: Diagnosis not present

## 2019-09-06 DIAGNOSIS — N186 End stage renal disease: Secondary | ICD-10-CM | POA: Diagnosis not present

## 2019-09-07 DIAGNOSIS — C50411 Malignant neoplasm of upper-outer quadrant of right female breast: Secondary | ICD-10-CM | POA: Diagnosis not present

## 2019-09-07 DIAGNOSIS — Z170 Estrogen receptor positive status [ER+]: Secondary | ICD-10-CM | POA: Diagnosis not present

## 2019-09-07 DIAGNOSIS — C9000 Multiple myeloma not having achieved remission: Secondary | ICD-10-CM | POA: Diagnosis not present

## 2019-09-07 DIAGNOSIS — I129 Hypertensive chronic kidney disease with stage 1 through stage 4 chronic kidney disease, or unspecified chronic kidney disease: Secondary | ICD-10-CM | POA: Diagnosis not present

## 2019-09-07 DIAGNOSIS — F419 Anxiety disorder, unspecified: Secondary | ICD-10-CM | POA: Diagnosis not present

## 2019-09-07 DIAGNOSIS — N184 Chronic kidney disease, stage 4 (severe): Secondary | ICD-10-CM | POA: Diagnosis not present

## 2019-09-07 DIAGNOSIS — E1122 Type 2 diabetes mellitus with diabetic chronic kidney disease: Secondary | ICD-10-CM | POA: Diagnosis not present

## 2019-09-07 DIAGNOSIS — Z992 Dependence on renal dialysis: Secondary | ICD-10-CM | POA: Diagnosis not present

## 2019-09-07 DIAGNOSIS — D631 Anemia in chronic kidney disease: Secondary | ICD-10-CM | POA: Diagnosis not present

## 2019-09-08 DIAGNOSIS — Z992 Dependence on renal dialysis: Secondary | ICD-10-CM | POA: Diagnosis not present

## 2019-09-08 DIAGNOSIS — N186 End stage renal disease: Secondary | ICD-10-CM | POA: Diagnosis not present

## 2019-09-11 DIAGNOSIS — Z992 Dependence on renal dialysis: Secondary | ICD-10-CM | POA: Diagnosis not present

## 2019-09-11 DIAGNOSIS — N186 End stage renal disease: Secondary | ICD-10-CM | POA: Diagnosis not present

## 2019-09-12 ENCOUNTER — Other Ambulatory Visit: Payer: Self-pay

## 2019-09-12 ENCOUNTER — Inpatient Hospital Stay (HOSPITAL_COMMUNITY): Payer: Medicare PPO

## 2019-09-12 ENCOUNTER — Inpatient Hospital Stay (HOSPITAL_COMMUNITY): Payer: Medicare PPO | Attending: Hematology

## 2019-09-12 VITALS — BP 105/55 | HR 73 | Temp 96.6°F | Resp 18 | Wt 140.8 lb

## 2019-09-12 DIAGNOSIS — I129 Hypertensive chronic kidney disease with stage 1 through stage 4 chronic kidney disease, or unspecified chronic kidney disease: Secondary | ICD-10-CM | POA: Insufficient documentation

## 2019-09-12 DIAGNOSIS — C9000 Multiple myeloma not having achieved remission: Secondary | ICD-10-CM | POA: Diagnosis not present

## 2019-09-12 DIAGNOSIS — Z5111 Encounter for antineoplastic chemotherapy: Secondary | ICD-10-CM | POA: Diagnosis not present

## 2019-09-12 DIAGNOSIS — F329 Major depressive disorder, single episode, unspecified: Secondary | ICD-10-CM | POA: Insufficient documentation

## 2019-09-12 DIAGNOSIS — D649 Anemia, unspecified: Secondary | ICD-10-CM | POA: Insufficient documentation

## 2019-09-12 DIAGNOSIS — K219 Gastro-esophageal reflux disease without esophagitis: Secondary | ICD-10-CM | POA: Insufficient documentation

## 2019-09-12 DIAGNOSIS — E119 Type 2 diabetes mellitus without complications: Secondary | ICD-10-CM | POA: Insufficient documentation

## 2019-09-12 DIAGNOSIS — N186 End stage renal disease: Secondary | ICD-10-CM | POA: Insufficient documentation

## 2019-09-12 DIAGNOSIS — F0390 Unspecified dementia without behavioral disturbance: Secondary | ICD-10-CM | POA: Insufficient documentation

## 2019-09-12 DIAGNOSIS — Z992 Dependence on renal dialysis: Secondary | ICD-10-CM | POA: Insufficient documentation

## 2019-09-12 DIAGNOSIS — F419 Anxiety disorder, unspecified: Secondary | ICD-10-CM | POA: Insufficient documentation

## 2019-09-12 DIAGNOSIS — C50911 Malignant neoplasm of unspecified site of right female breast: Secondary | ICD-10-CM | POA: Diagnosis not present

## 2019-09-12 DIAGNOSIS — M8580 Other specified disorders of bone density and structure, unspecified site: Secondary | ICD-10-CM | POA: Insufficient documentation

## 2019-09-12 DIAGNOSIS — Z79899 Other long term (current) drug therapy: Secondary | ICD-10-CM | POA: Insufficient documentation

## 2019-09-12 DIAGNOSIS — Z79811 Long term (current) use of aromatase inhibitors: Secondary | ICD-10-CM | POA: Diagnosis not present

## 2019-09-12 LAB — CBC WITH DIFFERENTIAL/PLATELET
Abs Immature Granulocytes: 0.03 10*3/uL (ref 0.00–0.07)
Basophils Absolute: 0 10*3/uL (ref 0.0–0.1)
Basophils Relative: 0 %
Eosinophils Absolute: 0.2 10*3/uL (ref 0.0–0.5)
Eosinophils Relative: 3 %
HCT: 28 % — ABNORMAL LOW (ref 36.0–46.0)
Hemoglobin: 8.8 g/dL — ABNORMAL LOW (ref 12.0–15.0)
Immature Granulocytes: 0 %
Lymphocytes Relative: 18 %
Lymphs Abs: 1.3 10*3/uL (ref 0.7–4.0)
MCH: 32.8 pg (ref 26.0–34.0)
MCHC: 31.4 g/dL (ref 30.0–36.0)
MCV: 104.5 fL — ABNORMAL HIGH (ref 80.0–100.0)
Monocytes Absolute: 0.5 10*3/uL (ref 0.1–1.0)
Monocytes Relative: 6 %
Neutro Abs: 5.3 10*3/uL (ref 1.7–7.7)
Neutrophils Relative %: 73 %
Platelets: 198 10*3/uL (ref 150–400)
RBC: 2.68 MIL/uL — ABNORMAL LOW (ref 3.87–5.11)
RDW: 15.9 % — ABNORMAL HIGH (ref 11.5–15.5)
WBC: 7.3 10*3/uL (ref 4.0–10.5)
nRBC: 0 % (ref 0.0–0.2)

## 2019-09-12 LAB — COMPREHENSIVE METABOLIC PANEL
ALT: 14 U/L (ref 0–44)
AST: 31 U/L (ref 15–41)
Albumin: 3.6 g/dL (ref 3.5–5.0)
Alkaline Phosphatase: 60 U/L (ref 38–126)
Anion gap: 12 (ref 5–15)
BUN: 29 mg/dL — ABNORMAL HIGH (ref 8–23)
CO2: 31 mmol/L (ref 22–32)
Calcium: 9.2 mg/dL (ref 8.9–10.3)
Chloride: 95 mmol/L — ABNORMAL LOW (ref 98–111)
Creatinine, Ser: 4.44 mg/dL — ABNORMAL HIGH (ref 0.44–1.00)
GFR calc Af Amer: 10 mL/min — ABNORMAL LOW (ref >60)
GFR calc non Af Amer: 9 mL/min — ABNORMAL LOW (ref >60)
Glucose, Bld: 166 mg/dL — ABNORMAL HIGH (ref 70–99)
Potassium: 4.3 mmol/L (ref 3.5–5.1)
Sodium: 138 mmol/L (ref 135–145)
Total Bilirubin: 0.4 mg/dL (ref 0.3–1.2)
Total Protein: 6.6 g/dL (ref 6.5–8.1)

## 2019-09-12 MED ORDER — BORTEZOMIB CHEMO SQ INJECTION 3.5 MG (2.5MG/ML)
1.3000 mg/m2 | Freq: Once | INTRAMUSCULAR | Status: AC
  Administered 2019-09-12: 2.25 mg via SUBCUTANEOUS
  Filled 2019-09-12: qty 0.9

## 2019-09-12 MED ORDER — LANREOTIDE ACETATE 120 MG/0.5ML ~~LOC~~ SOLN
SUBCUTANEOUS | Status: AC
  Filled 2019-09-12: qty 120

## 2019-09-12 MED ORDER — PROCHLORPERAZINE MALEATE 10 MG PO TABS
ORAL_TABLET | ORAL | Status: AC
  Filled 2019-09-12: qty 1

## 2019-09-12 MED ORDER — DEXAMETHASONE 4 MG PO TABS
ORAL_TABLET | ORAL | Status: AC
  Filled 2019-09-12: qty 3

## 2019-09-12 MED ORDER — DEXAMETHASONE 4 MG PO TABS
10.0000 mg | ORAL_TABLET | Freq: Once | ORAL | Status: AC
  Administered 2019-09-12: 10 mg via ORAL
  Filled 2019-09-12: qty 3

## 2019-09-12 MED ORDER — PROCHLORPERAZINE MALEATE 10 MG PO TABS
10.0000 mg | ORAL_TABLET | Freq: Once | ORAL | Status: AC
  Administered 2019-09-12: 10 mg via ORAL
  Filled 2019-09-12: qty 1

## 2019-09-12 MED ORDER — DENOSUMAB 120 MG/1.7ML ~~LOC~~ SOLN
SUBCUTANEOUS | Status: AC
  Filled 2019-09-12: qty 1.7

## 2019-09-12 NOTE — Patient Instructions (Signed)
[facility] Discharge Instructions for Patients Receiving Chemotherapy  Today you received the following chemotherapy agents   To help prevent nausea and vomiting after your treatment, we encourage you to take your nausea medication   If you develop nausea and vomiting that is not controlled by your nausea medication, call the clinic.   BELOW ARE SYMPTOMS THAT SHOULD BE REPORTED IMMEDIATELY:  *FEVER GREATER THAN 100.5 F  *CHILLS WITH OR WITHOUT FEVER  NAUSEA AND VOMITING THAT IS NOT CONTROLLED WITH YOUR NAUSEA MEDICATION  *UNUSUAL SHORTNESS OF BREATH  *UNUSUAL BRUISING OR BLEEDING  TENDERNESS IN MOUTH AND THROAT WITH OR WITHOUT PRESENCE OF ULCERS  *URINARY PROBLEMS  *BOWEL PROBLEMS  UNUSUAL RASH Items with * indicate a potential emergency and should be followed up as soon as possible.  Feel free to call the clinic should you have any questions or concerns. The clinic phone number is [phone].  Please show the CHEMO ALERT CARD at check-in to the Emergency Department and triage nurse.   

## 2019-09-12 NOTE — Progress Notes (Signed)
Labs reviewed and are within limits for treatment. Proceed as planned.BUN and creatinine noted about the same.   Treatment given per orders. Patient tolerated it well without problems. Vitals stable and discharged home from clinic via wheelchair. Follow up as scheduled.

## 2019-09-13 DIAGNOSIS — Z992 Dependence on renal dialysis: Secondary | ICD-10-CM | POA: Diagnosis not present

## 2019-09-13 DIAGNOSIS — E11319 Type 2 diabetes mellitus with unspecified diabetic retinopathy without macular edema: Secondary | ICD-10-CM | POA: Diagnosis not present

## 2019-09-13 DIAGNOSIS — N186 End stage renal disease: Secondary | ICD-10-CM | POA: Diagnosis not present

## 2019-09-13 DIAGNOSIS — Z789 Other specified health status: Secondary | ICD-10-CM | POA: Diagnosis not present

## 2019-09-13 DIAGNOSIS — Z299 Encounter for prophylactic measures, unspecified: Secondary | ICD-10-CM | POA: Diagnosis not present

## 2019-09-13 DIAGNOSIS — E1122 Type 2 diabetes mellitus with diabetic chronic kidney disease: Secondary | ICD-10-CM | POA: Diagnosis not present

## 2019-09-13 DIAGNOSIS — E1165 Type 2 diabetes mellitus with hyperglycemia: Secondary | ICD-10-CM | POA: Diagnosis not present

## 2019-09-13 DIAGNOSIS — I10 Essential (primary) hypertension: Secondary | ICD-10-CM | POA: Diagnosis not present

## 2019-09-14 DIAGNOSIS — Z992 Dependence on renal dialysis: Secondary | ICD-10-CM | POA: Diagnosis not present

## 2019-09-14 DIAGNOSIS — I129 Hypertensive chronic kidney disease with stage 1 through stage 4 chronic kidney disease, or unspecified chronic kidney disease: Secondary | ICD-10-CM | POA: Diagnosis not present

## 2019-09-14 DIAGNOSIS — N184 Chronic kidney disease, stage 4 (severe): Secondary | ICD-10-CM | POA: Diagnosis not present

## 2019-09-14 DIAGNOSIS — C9000 Multiple myeloma not having achieved remission: Secondary | ICD-10-CM | POA: Diagnosis not present

## 2019-09-14 DIAGNOSIS — F419 Anxiety disorder, unspecified: Secondary | ICD-10-CM | POA: Diagnosis not present

## 2019-09-14 DIAGNOSIS — D631 Anemia in chronic kidney disease: Secondary | ICD-10-CM | POA: Diagnosis not present

## 2019-09-14 DIAGNOSIS — Z170 Estrogen receptor positive status [ER+]: Secondary | ICD-10-CM | POA: Diagnosis not present

## 2019-09-14 DIAGNOSIS — E1122 Type 2 diabetes mellitus with diabetic chronic kidney disease: Secondary | ICD-10-CM | POA: Diagnosis not present

## 2019-09-14 DIAGNOSIS — C50411 Malignant neoplasm of upper-outer quadrant of right female breast: Secondary | ICD-10-CM | POA: Diagnosis not present

## 2019-09-15 DIAGNOSIS — Z992 Dependence on renal dialysis: Secondary | ICD-10-CM | POA: Diagnosis not present

## 2019-09-15 DIAGNOSIS — N186 End stage renal disease: Secondary | ICD-10-CM | POA: Diagnosis not present

## 2019-09-18 DIAGNOSIS — Z992 Dependence on renal dialysis: Secondary | ICD-10-CM | POA: Diagnosis not present

## 2019-09-18 DIAGNOSIS — N186 End stage renal disease: Secondary | ICD-10-CM | POA: Diagnosis not present

## 2019-09-19 ENCOUNTER — Other Ambulatory Visit: Payer: Self-pay

## 2019-09-19 ENCOUNTER — Inpatient Hospital Stay (HOSPITAL_COMMUNITY): Payer: Medicare PPO

## 2019-09-19 ENCOUNTER — Encounter (HOSPITAL_COMMUNITY): Payer: Self-pay

## 2019-09-19 VITALS — BP 100/47 | HR 60 | Temp 97.1°F | Resp 18 | Wt 139.8 lb

## 2019-09-19 DIAGNOSIS — C50911 Malignant neoplasm of unspecified site of right female breast: Secondary | ICD-10-CM | POA: Diagnosis not present

## 2019-09-19 DIAGNOSIS — C9000 Multiple myeloma not having achieved remission: Secondary | ICD-10-CM

## 2019-09-19 DIAGNOSIS — M8580 Other specified disorders of bone density and structure, unspecified site: Secondary | ICD-10-CM | POA: Diagnosis not present

## 2019-09-19 DIAGNOSIS — Z5111 Encounter for antineoplastic chemotherapy: Secondary | ICD-10-CM | POA: Diagnosis not present

## 2019-09-19 DIAGNOSIS — E119 Type 2 diabetes mellitus without complications: Secondary | ICD-10-CM | POA: Diagnosis not present

## 2019-09-19 DIAGNOSIS — D649 Anemia, unspecified: Secondary | ICD-10-CM | POA: Diagnosis not present

## 2019-09-19 DIAGNOSIS — N186 End stage renal disease: Secondary | ICD-10-CM | POA: Diagnosis not present

## 2019-09-19 DIAGNOSIS — Z79811 Long term (current) use of aromatase inhibitors: Secondary | ICD-10-CM | POA: Diagnosis not present

## 2019-09-19 DIAGNOSIS — Z992 Dependence on renal dialysis: Secondary | ICD-10-CM | POA: Diagnosis not present

## 2019-09-19 LAB — COMPREHENSIVE METABOLIC PANEL
ALT: 13 U/L (ref 0–44)
AST: 28 U/L (ref 15–41)
Albumin: 3.7 g/dL (ref 3.5–5.0)
Alkaline Phosphatase: 59 U/L (ref 38–126)
Anion gap: 11 (ref 5–15)
BUN: 21 mg/dL (ref 8–23)
CO2: 31 mmol/L (ref 22–32)
Calcium: 8.8 mg/dL — ABNORMAL LOW (ref 8.9–10.3)
Chloride: 92 mmol/L — ABNORMAL LOW (ref 98–111)
Creatinine, Ser: 4.24 mg/dL — ABNORMAL HIGH (ref 0.44–1.00)
GFR calc Af Amer: 11 mL/min — ABNORMAL LOW (ref >60)
GFR calc non Af Amer: 9 mL/min — ABNORMAL LOW (ref >60)
Glucose, Bld: 130 mg/dL — ABNORMAL HIGH (ref 70–99)
Potassium: 4.2 mmol/L (ref 3.5–5.1)
Sodium: 134 mmol/L — ABNORMAL LOW (ref 135–145)
Total Bilirubin: 0.3 mg/dL (ref 0.3–1.2)
Total Protein: 6.6 g/dL (ref 6.5–8.1)

## 2019-09-19 LAB — CBC WITH DIFFERENTIAL/PLATELET
Abs Immature Granulocytes: 0.04 10*3/uL (ref 0.00–0.07)
Basophils Absolute: 0 10*3/uL (ref 0.0–0.1)
Basophils Relative: 1 %
Eosinophils Absolute: 0.2 10*3/uL (ref 0.0–0.5)
Eosinophils Relative: 3 %
HCT: 25.3 % — ABNORMAL LOW (ref 36.0–46.0)
Hemoglobin: 8.2 g/dL — ABNORMAL LOW (ref 12.0–15.0)
Immature Granulocytes: 1 %
Lymphocytes Relative: 16 %
Lymphs Abs: 1.2 10*3/uL (ref 0.7–4.0)
MCH: 33.7 pg (ref 26.0–34.0)
MCHC: 32.4 g/dL (ref 30.0–36.0)
MCV: 104.1 fL — ABNORMAL HIGH (ref 80.0–100.0)
Monocytes Absolute: 0.5 10*3/uL (ref 0.1–1.0)
Monocytes Relative: 8 %
Neutro Abs: 5.1 10*3/uL (ref 1.7–7.7)
Neutrophils Relative %: 71 %
Platelets: 186 10*3/uL (ref 150–400)
RBC: 2.43 MIL/uL — ABNORMAL LOW (ref 3.87–5.11)
RDW: 16.3 % — ABNORMAL HIGH (ref 11.5–15.5)
WBC: 7.1 10*3/uL (ref 4.0–10.5)
nRBC: 0 % (ref 0.0–0.2)

## 2019-09-19 MED ORDER — DEXAMETHASONE 4 MG PO TABS
10.0000 mg | ORAL_TABLET | Freq: Once | ORAL | Status: AC
  Administered 2019-09-19: 10 mg via ORAL
  Filled 2019-09-19: qty 3

## 2019-09-19 MED ORDER — BORTEZOMIB CHEMO SQ INJECTION 3.5 MG (2.5MG/ML)
1.3000 mg/m2 | Freq: Once | INTRAMUSCULAR | Status: AC
  Administered 2019-09-19: 2.25 mg via SUBCUTANEOUS
  Filled 2019-09-19: qty 0.9

## 2019-09-19 MED ORDER — PROCHLORPERAZINE MALEATE 10 MG PO TABS
10.0000 mg | ORAL_TABLET | Freq: Once | ORAL | Status: AC
  Administered 2019-09-19: 10 mg via ORAL
  Filled 2019-09-19: qty 1

## 2019-09-19 NOTE — Progress Notes (Unsigned)
Patient presents today for Velcade injection. Labs reviewed by [facility] NP. Creatinine today 4.24. NP aware. Verbal order received to proceed with treatment. Blood pressure 100/47 today. Patient's normal.   Treatment given today per MD orders. Tolerated without adverse affects. Vital signs stable. No complaints at this time. Discharged from clinic via wheel chair. F/U with [facility] as scheduled.

## 2019-09-20 DIAGNOSIS — N186 End stage renal disease: Secondary | ICD-10-CM | POA: Diagnosis not present

## 2019-09-20 DIAGNOSIS — Z992 Dependence on renal dialysis: Secondary | ICD-10-CM | POA: Diagnosis not present

## 2019-09-20 MED ORDER — LEUPROLIDE ACETATE 7.5 MG IM KIT
PACK | INTRAMUSCULAR | Status: AC
  Filled 2019-09-20: qty 7.5

## 2019-09-21 DIAGNOSIS — Z992 Dependence on renal dialysis: Secondary | ICD-10-CM | POA: Diagnosis not present

## 2019-09-21 DIAGNOSIS — I129 Hypertensive chronic kidney disease with stage 1 through stage 4 chronic kidney disease, or unspecified chronic kidney disease: Secondary | ICD-10-CM | POA: Diagnosis not present

## 2019-09-21 DIAGNOSIS — C50411 Malignant neoplasm of upper-outer quadrant of right female breast: Secondary | ICD-10-CM | POA: Diagnosis not present

## 2019-09-21 DIAGNOSIS — E1122 Type 2 diabetes mellitus with diabetic chronic kidney disease: Secondary | ICD-10-CM | POA: Diagnosis not present

## 2019-09-21 DIAGNOSIS — F419 Anxiety disorder, unspecified: Secondary | ICD-10-CM | POA: Diagnosis not present

## 2019-09-21 DIAGNOSIS — C9000 Multiple myeloma not having achieved remission: Secondary | ICD-10-CM | POA: Diagnosis not present

## 2019-09-21 DIAGNOSIS — Z170 Estrogen receptor positive status [ER+]: Secondary | ICD-10-CM | POA: Diagnosis not present

## 2019-09-21 DIAGNOSIS — D631 Anemia in chronic kidney disease: Secondary | ICD-10-CM | POA: Diagnosis not present

## 2019-09-21 DIAGNOSIS — N184 Chronic kidney disease, stage 4 (severe): Secondary | ICD-10-CM | POA: Diagnosis not present

## 2019-09-22 DIAGNOSIS — Z992 Dependence on renal dialysis: Secondary | ICD-10-CM | POA: Diagnosis not present

## 2019-09-22 DIAGNOSIS — N186 End stage renal disease: Secondary | ICD-10-CM | POA: Diagnosis not present

## 2019-09-25 DIAGNOSIS — N186 End stage renal disease: Secondary | ICD-10-CM | POA: Diagnosis not present

## 2019-09-25 DIAGNOSIS — Z992 Dependence on renal dialysis: Secondary | ICD-10-CM | POA: Diagnosis not present

## 2019-09-27 DIAGNOSIS — Z992 Dependence on renal dialysis: Secondary | ICD-10-CM | POA: Diagnosis not present

## 2019-09-27 DIAGNOSIS — N186 End stage renal disease: Secondary | ICD-10-CM | POA: Diagnosis not present

## 2019-09-28 ENCOUNTER — Other Ambulatory Visit (HOSPITAL_COMMUNITY): Payer: Self-pay | Admitting: Nurse Practitioner

## 2019-09-29 DIAGNOSIS — N186 End stage renal disease: Secondary | ICD-10-CM | POA: Diagnosis not present

## 2019-09-29 DIAGNOSIS — Z992 Dependence on renal dialysis: Secondary | ICD-10-CM | POA: Diagnosis not present

## 2019-10-02 DIAGNOSIS — Z992 Dependence on renal dialysis: Secondary | ICD-10-CM | POA: Diagnosis not present

## 2019-10-02 DIAGNOSIS — N186 End stage renal disease: Secondary | ICD-10-CM | POA: Diagnosis not present

## 2019-10-03 ENCOUNTER — Encounter (HOSPITAL_COMMUNITY): Payer: Self-pay | Admitting: General Practice

## 2019-10-03 ENCOUNTER — Inpatient Hospital Stay (HOSPITAL_COMMUNITY): Payer: Medicare PPO

## 2019-10-03 ENCOUNTER — Other Ambulatory Visit (HOSPITAL_COMMUNITY): Payer: Self-pay | Admitting: *Deleted

## 2019-10-03 ENCOUNTER — Inpatient Hospital Stay (HOSPITAL_COMMUNITY): Payer: Medicare PPO | Admitting: Hematology

## 2019-10-03 ENCOUNTER — Other Ambulatory Visit: Payer: Self-pay

## 2019-10-03 VITALS — BP 94/58 | HR 57 | Temp 98.9°F | Resp 18 | Wt 136.4 lb

## 2019-10-03 DIAGNOSIS — M8580 Other specified disorders of bone density and structure, unspecified site: Secondary | ICD-10-CM | POA: Diagnosis not present

## 2019-10-03 DIAGNOSIS — Z992 Dependence on renal dialysis: Secondary | ICD-10-CM | POA: Diagnosis not present

## 2019-10-03 DIAGNOSIS — Z79811 Long term (current) use of aromatase inhibitors: Secondary | ICD-10-CM | POA: Diagnosis not present

## 2019-10-03 DIAGNOSIS — C9000 Multiple myeloma not having achieved remission: Secondary | ICD-10-CM | POA: Diagnosis not present

## 2019-10-03 DIAGNOSIS — E119 Type 2 diabetes mellitus without complications: Secondary | ICD-10-CM | POA: Diagnosis not present

## 2019-10-03 DIAGNOSIS — Z5111 Encounter for antineoplastic chemotherapy: Secondary | ICD-10-CM | POA: Diagnosis not present

## 2019-10-03 DIAGNOSIS — N186 End stage renal disease: Secondary | ICD-10-CM | POA: Diagnosis not present

## 2019-10-03 DIAGNOSIS — C50911 Malignant neoplasm of unspecified site of right female breast: Secondary | ICD-10-CM | POA: Diagnosis not present

## 2019-10-03 DIAGNOSIS — D649 Anemia, unspecified: Secondary | ICD-10-CM | POA: Diagnosis not present

## 2019-10-03 LAB — COMPREHENSIVE METABOLIC PANEL
ALT: 13 U/L (ref 0–44)
AST: 22 U/L (ref 15–41)
Albumin: 3.6 g/dL (ref 3.5–5.0)
Alkaline Phosphatase: 81 U/L (ref 38–126)
Anion gap: 10 (ref 5–15)
BUN: 21 mg/dL (ref 8–23)
CO2: 30 mmol/L (ref 22–32)
Calcium: 8.6 mg/dL — ABNORMAL LOW (ref 8.9–10.3)
Chloride: 90 mmol/L — ABNORMAL LOW (ref 98–111)
Creatinine, Ser: 4.05 mg/dL — ABNORMAL HIGH (ref 0.44–1.00)
GFR calc Af Amer: 11 mL/min — ABNORMAL LOW (ref >60)
GFR calc non Af Amer: 10 mL/min — ABNORMAL LOW (ref >60)
Glucose, Bld: 140 mg/dL — ABNORMAL HIGH (ref 70–99)
Potassium: 3.9 mmol/L (ref 3.5–5.1)
Sodium: 130 mmol/L — ABNORMAL LOW (ref 135–145)
Total Bilirubin: 0.5 mg/dL (ref 0.3–1.2)
Total Protein: 6.6 g/dL (ref 6.5–8.1)

## 2019-10-03 LAB — CBC WITH DIFFERENTIAL/PLATELET
Abs Immature Granulocytes: 0.03 10*3/uL (ref 0.00–0.07)
Basophils Absolute: 0 10*3/uL (ref 0.0–0.1)
Basophils Relative: 1 %
Eosinophils Absolute: 0.2 10*3/uL (ref 0.0–0.5)
Eosinophils Relative: 3 %
HCT: 24.6 % — ABNORMAL LOW (ref 36.0–46.0)
Hemoglobin: 8.1 g/dL — ABNORMAL LOW (ref 12.0–15.0)
Immature Granulocytes: 0 %
Lymphocytes Relative: 14 %
Lymphs Abs: 0.9 10*3/uL (ref 0.7–4.0)
MCH: 34.9 pg — ABNORMAL HIGH (ref 26.0–34.0)
MCHC: 32.9 g/dL (ref 30.0–36.0)
MCV: 106 fL — ABNORMAL HIGH (ref 80.0–100.0)
Monocytes Absolute: 0.5 10*3/uL (ref 0.1–1.0)
Monocytes Relative: 7 %
Neutro Abs: 5.2 10*3/uL (ref 1.7–7.7)
Neutrophils Relative %: 75 %
Platelets: 241 10*3/uL (ref 150–400)
RBC: 2.32 MIL/uL — ABNORMAL LOW (ref 3.87–5.11)
RDW: 16.3 % — ABNORMAL HIGH (ref 11.5–15.5)
WBC: 6.9 10*3/uL (ref 4.0–10.5)
nRBC: 0 % (ref 0.0–0.2)

## 2019-10-03 LAB — LACTATE DEHYDROGENASE: LDH: 136 U/L (ref 98–192)

## 2019-10-03 MED ORDER — DEXAMETHASONE 4 MG PO TABS
10.0000 mg | ORAL_TABLET | Freq: Once | ORAL | Status: AC
  Administered 2019-10-03: 10 mg via ORAL
  Filled 2019-10-03: qty 3

## 2019-10-03 MED ORDER — BORTEZOMIB CHEMO SQ INJECTION 3.5 MG (2.5MG/ML)
1.3000 mg/m2 | Freq: Once | INTRAMUSCULAR | Status: AC
  Administered 2019-10-03: 2.25 mg via SUBCUTANEOUS
  Filled 2019-10-03: qty 0.9

## 2019-10-03 MED ORDER — PROCHLORPERAZINE MALEATE 10 MG PO TABS
10.0000 mg | ORAL_TABLET | Freq: Once | ORAL | Status: AC
  Administered 2019-10-03: 10 mg via ORAL
  Filled 2019-10-03: qty 1

## 2019-10-03 NOTE — Progress Notes (Signed)
Carly Wood, [place] [address]   CLINIC:  Medical Oncology/Hematology  PCP:  Monico Blitz, MD [address] / [place] [address] [phone]   REASON FOR VISIT:  Follow-up for plasma cell myeloma and anemia  PRIOR THERAPY: None  CURRENT THERAPY: Velcade, dexamethasone  BRIEF ONCOLOGIC HISTORY:  Oncology History  Stage 1 infiltrating ductal carcinoma of right female breast ([name])  09/12/2014 Mammogram   Mass in upper outer R breast, middle third depth appears slightly larger than it was on prior exam with more irreg spiculated margins   10/02/2014 Pathology Results   biopsy with invasive ductal carcinoma high grade 1.1 cm, dcis solid type. additional R breast tissue, excision, invasive ductal high grade 0.9 cm    10/24/2014 Pathology Results   no residual invasive carcinoma, DCIS, focal, atypical ductal hyperplasia, 0/7 LN positive for metastatic carcinoma ER > 90%, PR 30%, HER 2 2+   10/24/2014 Cancer Staging   T1cN0M0   10/24/2014 Surgery   R mastectomy, T1c, N0   12/28/2014 - 11/22/2015 Chemotherapy   Taxol/herceptin weekly X 12, Herceptin every 21 days, last due in September 2017   03/11/2015 -  Anti-estrogen oral therapy   Arimidex 1 mg daily   09/12/2015 Imaging   MUGA- The left ventricular ejection fraction equals 68%.   06/08/2016 Treatment Plan Change   Started Nerlynx   Multiple myeloma ([name])  10/04/2018 Initial Diagnosis   Multiple myeloma ([name])   10/11/2018 -  Chemotherapy   The patient had dexamethasone (DECADRON) tablet 10 mg, 10 mg (100 % of original dose 10 mg), Oral,  Once, 13 of 14 cycles Dose modification: 10 mg (original dose 10 mg, Cycle 1) Administration: 10 mg (10/11/2018), 10 mg (10/18/2018), 10 mg (10/25/2018), 10 mg (11/01/2018), 10 mg (11/08/2018), 10 mg (11/15/2018), 10 mg (11/22/2018), 10 mg (11/29/2018), 10 mg (12/06/2018), 10 mg (12/20/2018), 10 mg (12/27/2018), 10 mg (01/03/2019), 10 mg (01/17/2019), 10 mg (01/31/2019), 10  mg (02/14/2019), 10 mg (02/21/2019), 10 mg (02/28/2019), 10 mg (03/14/2019), 10 mg (03/21/2019), 10 mg (03/28/2019), 10 mg (04/11/2019), 10 mg (04/18/2019), 10 mg (04/25/2019), 10 mg (05/16/2019), 10 mg (05/23/2019), 10 mg (05/30/2019), 10 mg (06/13/2019), 10 mg (06/20/2019), 10 mg (06/27/2019), 10 mg (07/11/2019), 10 mg (07/18/2019), 10 mg (07/25/2019), 10 mg (08/08/2019), 10 mg (08/15/2019), 10 mg (08/22/2019), 10 mg (09/05/2019), 10 mg (09/12/2019) bortezomib SQ (VELCADE) chemo injection 2.5 mg, 1.3 mg/m2 = 2.5 mg, Subcutaneous,  Once, 13 of 14 cycles Administration: 2.5 mg (10/11/2018), 2.5 mg (10/18/2018), 2.5 mg (10/25/2018), 2.5 mg (11/01/2018), 2.5 mg (11/08/2018), 2.5 mg (11/15/2018), 2.5 mg (11/22/2018), 2.5 mg (11/29/2018), 2.5 mg (12/06/2018), 2.5 mg (12/20/2018), 2.5 mg (12/27/2018), 2.5 mg (01/03/2019), 2.5 mg (01/17/2019), 2.5 mg (01/31/2019), 2.5 mg (02/14/2019), 2.5 mg (02/21/2019), 2.5 mg (02/28/2019), 2.5 mg (03/14/2019), 2.5 mg (03/21/2019), 2.5 mg (03/28/2019), 2.5 mg (04/11/2019), 2.5 mg (04/18/2019), 2.5 mg (04/25/2019), 2.5 mg (05/16/2019), 2.5 mg (05/23/2019), 2.5 mg (05/30/2019), 2.25 mg (06/13/2019), 2.25 mg (06/20/2019), 2.25 mg (06/27/2019), 2.25 mg (07/11/2019), 2.25 mg (07/18/2019), 2.25 mg (07/25/2019), 2.25 mg (08/08/2019), 2.25 mg (08/15/2019), 2.25 mg (08/22/2019), 2.25 mg (09/05/2019), 2.25 mg (09/12/2019)  for chemotherapy treatment.      CANCER STAGING: Cancer Staging Stage 1 infiltrating ductal carcinoma of right female breast [facility]) Staging form: Breast, AJCC 7th Edition - Clinical stage from 08/30/2015: Stage IA (T1c, N0, M0) - Signed by Baird Cancer, PA-C on 08/30/2015   INTERVAL HISTORY:  Carly Wood, a 84 y.o. female, returns  for routine follow-up of her plasma cell myeloma and anemia. Carly Wood was last seen on 08/08/2019.   She continues with dialysis. She does have some mild confusion post dialysis, but she does recover rather quickly. She doesn't want to be on dialysis for the rest of her life.   At this time,  Carly Wood's sister states that she needs help and would like to get Carly Wood and her sister Wilford Sports into a nursing home together.   REVIEW OF SYSTEMS:  Review of Systems  Constitutional: Positive for appetite change (mildly decreased) and fatigue (severe).  HENT:   Positive for trouble swallowing.   Eyes: Negative.   Respiratory: Negative.   Cardiovascular: Negative.   Gastrointestinal: Negative.   Endocrine: Negative.   Genitourinary: Negative.         On dialysis  Musculoskeletal: Negative.   Skin: Negative.   Neurological: Negative.   Hematological: Negative.   Psychiatric/Behavioral: Negative.   All other systems reviewed and are negative.   PAST MEDICAL/SURGICAL HISTORY:  Past Medical History:  Diagnosis Date  . Anemia   . Anxiety   . Chronic kidney disease   . Chronic renal disease, stage 4, severely decreased glomerular filtration rate (GFR) between 15-29 mL/min/1.73 square meter (HCC) 08/22/2015  . Complication of anesthesia    delirious after Breast Surgery  . Dementia ([place])    mild  . Depression   . Diabetes mellitus without complication ([place])    type II  . Dyspnea    with activity  . GERD (gastroesophageal reflux disease)   . Glaucoma   . Hypertension   . Pneumonia   . Stage 1 infiltrating ductal carcinoma of right female breast ([place]) 08/21/2015   ER+ PR+ HER 2 neu + (3+) T1cN0    Past Surgical History:  Procedure Laterality Date  . AV FISTULA PLACEMENT Left 11/22/2017   Procedure: ARTERIOVENOUS (AV) FISTULA CREATION LEFT ARM;  Surgeon: Elam Dutch, MD;  Location: [facility];  Service: Vascular;  Laterality: Left;  . BIOPSY  08/07/2016   Procedure: BIOPSY;  Surgeon: Daneil Dolin, MD;  [facility];  Service: Endoscopy;;  gastric ulcer biopsy  . COLONOSCOPY    . ESOPHAGOGASTRODUODENOSCOPY N/A 08/07/2016   Procedure: ESOPHAGOGASTRODUODENOSCOPY (EGD);  Surgeon: Daneil Dolin, MD;  [facility];  Service: Endoscopy;  Laterality: N/A;   1215-rescheduled to 6/1 @ 2:30pm per Tretha Sciara  . ESOPHAGOGASTRODUODENOSCOPY N/A 11/27/2016   Procedure: ESOPHAGOGASTRODUODENOSCOPY (EGD);  Surgeon: Daneil Dolin, MD;  [facility];  Service: Endoscopy;  Laterality: N/A;  8:15am  . FISTULA SUPERFICIALIZATION Left 02/14/2018   Procedure: FISTULA SUPERFICIALIZATION LEFT ARM;  Surgeon: Angelia Mould, MD;  Location: [facility];  Service: Vascular;  Laterality: Left;  . FRACTURE SURGERY Right    ankle  . MALONEY DILATION N/A 08/07/2016   Procedure: Venia Minks DILATION;  Surgeon: Daneil Dolin, MD;  [facility];  Service: Endoscopy;  Laterality: N/A;  . MASTECTOMY, PARTIAL Right   . PERIPHERAL VASCULAR BALLOON ANGIOPLASTY Left 07/13/2019   Procedure: PERIPHERAL VASCULAR BALLOON ANGIOPLASTY;  Surgeon: Marty Heck, MD;  Location: [facility] CV LAB;  Service: Cardiovascular;  Laterality: Left;  arm fistulogram  . PORT-A-CATH REMOVAL Left 11/22/2017   Procedure: REMOVAL PORT-A-CATH LEFT CHEST;  Surgeon: Elam Dutch, MD;  Location: [facility] OR;  Service: Vascular;  Laterality: Left;  . RETINAL DETACHMENT SURGERY Right     SOCIAL HISTORY:  Social History   Socioeconomic History  . Marital status: Single  Spouse name: Not on file  . Number of children: Not on file  . Years of education: Not on file  . Highest education level: Not on file  Occupational History  . Not on file  Tobacco Use  . Smoking status: Never Smoker  . Smokeless tobacco: Never Used  Vaping Use  . Vaping Use: Never used  Substance and Sexual Activity  . Alcohol use: No    Alcohol/week: 0.0 standard drinks  . Drug use: No  . Sexual activity: Not on file  Other Topics Concern  . Not on file  Social History Narrative  . Not on file   Social Determinants of Health   Financial Resource Strain:   . Difficulty of Paying Living Expenses:   Food Insecurity:   . Worried About Charity fundraiser in the Last Year:   . Arboriculturist in the  Last Year:   Transportation Needs:   . Film/video editor (Medical):   Marland Kitchen Lack of Transportation (Non-Medical):   Physical Activity:   . Days of Exercise per Week:   . Minutes of Exercise per Session:   Stress:   . Feeling of Stress :   Social Connections:   . Frequency of Communication with Friends and Family:   . Frequency of Social Gatherings with Friends and Family:   . Attends Religious Services:   . Active Member of Clubs or Organizations:   . Attends Archivist Meetings:   Marland Kitchen Marital Status:   Intimate Partner Violence:   . Fear of Current or Ex-Partner:   . Emotionally Abused:   Marland Kitchen Physically Abused:   . Sexually Abused:     FAMILY HISTORY:  Family History  Problem Relation Age of Onset  . Multiple myeloma Sister   . Brain cancer Sister   . Dementia Mother        died at 60  . Stroke Mother   . Heart failure Mother   . Diabetes Mother   . Heart disease Father   . Prostate cancer Brother   . Colon cancer Neg Hx     CURRENT MEDICATIONS:  Current Outpatient Medications  Medication Sig Dispense Refill  . ACCU-CHEK AVIVA PLUS test strip     . acetaminophen (TYLENOL) 500 MG tablet Take 500 mg by mouth every 6 (six) hours as needed for moderate pain or headache.     Marland Kitchen acyclovir (ZOVIRAX) 200 MG capsule TAKE 1 CAPSULE BY MOUTH TWICE DAILY 60 capsule 2  . Alcohol Swabs (ALCOHOL PREP) 70 % PADS See admin instructions.  12  . amLODipine (NORVASC) 10 MG tablet Take 10 mg by mouth daily.  3  . anastrozole (ARIMIDEX) 1 MG tablet TAKE 1 TABLET BY MOUTH DAILY (Patient taking differently: Take 1 mg by mouth daily. ) 30 tablet 6  . AQUALANCE LANCETS 30G MISC USE TO test twice daily  99  . aspirin EC 81 MG tablet Take 81 mg by mouth daily.    . bortezomib IV (VELCADE) 3.5 MG injection Inject into the vein once a week. Weekly every 21 days    . calcitRIOL (ROCALTROL) 0.25 MCG capsule Take 0.25 mcg by mouth daily at 12 noon.     . Calcium Carbonate-Vitamin D (CALCIUM  600+D PO) Take 1 tablet by mouth daily.    . cloNIDine (CATAPRES) 0.3 MG tablet Take 0.3 mg by mouth 2 (two) times daily.     . diphenhydrAMINE (BENADRYL) 25 MG tablet Take 25 mg by mouth every  6 (six) hours as needed for allergies.     . feeding supplement, GLUCERNA SHAKE, (GLUCERNA SHAKE) LIQD Take 237 mLs by mouth daily.    Marland Kitchen GLOBAL EASE INJECT PEN NEEDLES 31G X 8 MM MISC USE EVERY DAY AS DIRECTED    . Lancet Devices (ADJUSTABLE LANCING DEVICE) MISC TO check blood glucose  1  . lidocaine-prilocaine (EMLA) cream Apply 1 application topically daily as needed (port access).     . Multiple Vitamin (MULTIVITAMIN) capsule Take 1 capsule by mouth daily.    . ONGLYZA 5 MG TABS tablet Take 5 mg by mouth daily.  11  . PARoxetine (PAXIL) 20 MG tablet Take 20 mg by mouth daily.     . pioglitazone (ACTOS) 30 MG tablet Take 15 mg by mouth daily.     . pravastatin (PRAVACHOL) 20 MG tablet Take 20 mg by mouth every evening.     . prochlorperazine (COMPAZINE) 10 MG tablet Take 10 mg by mouth every 6 (six) hours as needed for nausea or vomiting.     . sodium chloride (OCEAN) 0.65 % SOLN nasal spray Place 1 spray into both nostrils as needed for congestion.    Nelva Nay SOLOSTAR 300 UNIT/ML SOPN Inject 5 Units into the skin every evening.     . traZODone (DESYREL) 100 MG tablet Take 100 mg by mouth at bedtime.  2   No current facility-administered medications for this visit.    ALLERGIES:  Allergies  Allergen Reactions  . Penicillins Other (See Comments)    Unsure of reaction Has patient had a PCN reaction causing immediate rash, facial/tongue/throat swelling, SOB or lightheadedness with hypotension: Unknown Has patient had a PCN reaction causing severe rash involving mucus membranes or skin necrosis: Unknown Has patient had a PCN reaction that required hospitalization: No Has patient had a PCN reaction occurring within the last 10 years: Unknown If all of the above answers are "NO", then may proceed  with Cephalosporin use.    . Ace Inhibitors Cough and Other (See Comments)    Tongue swell  . Lisinopril Other (See Comments)    Tongue swell    PHYSICAL EXAM:  Performance status (ECOG): 2 - Symptomatic, <50% confined to bed  Vitals:   10/03/19 1313  BP: (!) 94/58  Pulse: 57  Resp: 18  Temp: 98.9 F (37.2 C)  SpO2: 97%   Wt Readings from Last 3 Encounters:  10/03/19 136 lb 6.4 oz (61.9 kg)  09/19/19 139 lb 12.8 oz (63.4 kg)  09/12/19 140 lb 12.8 oz (63.9 kg)   Physical Exam Vitals and nursing note reviewed.  Constitutional:      Appearance: Normal appearance.  HENT:     Mouth/Throat:     Mouth: Mucous membranes are moist.  Eyes:     Pupils: Pupils are equal, round, and reactive to light.  Cardiovascular:     Rate and Rhythm: Normal rate and regular rhythm.     Pulses: Normal pulses.     Heart sounds: Normal heart sounds.  Pulmonary:     Effort: Pulmonary effort is normal.     Breath sounds: Normal breath sounds.  Abdominal:     Palpations: Abdomen is soft. There is no mass.     Tenderness: There is no abdominal tenderness.  Musculoskeletal:     Cervical back: Neck supple.     Right lower leg: 1+ Edema present.     Left lower leg: 1+ Edema present.  Neurological:     Mental Status: She  is alert and oriented to person, place, and time.  Psychiatric:        Mood and Affect: Mood normal.        Behavior: Behavior normal.      LABORATORY DATA:  I have reviewed the labs as listed.  CBC Latest Ref Rng & Units 10/03/2019 09/19/2019 09/12/2019  WBC 4.0 - 10.5 K/uL 6.9 7.1 7.3  Hemoglobin 12.0 - 15.0 g/dL 8.1(L) 8.2(L) 8.8(L)  Hematocrit 36 - 46 % 24.6(L) 25.3(L) 28.0(L)  Platelets 150 - 400 K/uL 241 186 198   CMP Latest Ref Rng & Units 10/03/2019 09/19/2019 09/12/2019  Glucose 70 - 99 mg/dL 140(H) 130(H) 166(H)  BUN 8 - 23 mg/dL 21 21 29(H)  Creatinine 0.44 - 1.00 mg/dL 4.05(H) 4.24(H) 4.44(H)  Sodium 135 - 145 mmol/L 130(L) 134(L) 138  Potassium 3.5 - 5.1 mmol/L  3.9 4.2 4.3  Chloride 98 - 111 mmol/L 90(L) 92(L) 95(L)  CO2 22 - 32 mmol/L '30 31 31  ' Calcium 8.9 - 10.3 mg/dL 8.6(L) 8.8(L) 9.2  Total Protein 6.5 - 8.1 g/dL 6.6 6.6 6.6  Total Bilirubin 0.3 - 1.2 mg/dL 0.5 0.3 0.4  Alkaline Phos 38 - 126 U/L 81 59 60  AST 15 - 41 U/L '22 28 31  ' ALT 0 - 44 U/L '13 13 14    ' DIAGNOSTIC IMAGING:  I have independently reviewed the scans and discussed with the patient. No results found.    ASSESSMENT:  1.  IgA lambda plasma cell myeloma, stage II, standard risk: -BMBX on 09/20/2018 shows plasma cell myeloma, 30% plasma cells.  FISH panel was normal.  Chromosome analysis normal. -Labs at diagnosis on 08/29/2018 with M spike 0.9 g.  Kappa light chains 148, lambda light chains 1132, ratio 0.13.  LDH normal.  Beta-2 microglobulin 13.5.  She had transfusion dependent anemia. -Velcade and dexamethasone started on 10/11/2018. -Myeloma panel on 07/11/2019 shows SPEP is negative.  Free light chain ratio improved to 1.09.  Lambda light chains are 128, improved from 141.  Kappa light chains are 140.  2.  Stage I IDC of the right breast, ER/PR positive, HER-2 negative: -She is on anastrozole.   PLAN:  1.  IgA lambda plasma cell myeloma: -Reviewed myeloma labs from 08/08/2019.  M spike is negative.  Free light chain ratio improved to 1.0.  Kappa light chains are 128 and lambda light chains are 128. -She will continue Velcade and dexamethasone 3 weeks on 1 week off. -She will come back in 6 weeks with repeat labs.  2.  ESRD on HD: -She undergoes dialysis on Monday, Wednesday and Friday.  Reports some tiredness and some confusion after dialysis which recovers.  3.  Bone strengthening: -Bisphosphonates not started due to dialysis.  4.  Osteopenia: -Bone density on 05/16/2015 was osteopenic.  We will plan to repeat DEXA scan soon.  5.  Right breast cancer: -Continue anastrozole.   Orders placed this encounter:  No orders of the defined types were placed in this  encounter.    Derek Jack, MD [facility] [phone]   I, Jacqualyn Posey, am acting as a scribe for Dr. Sanda Linger.  I, Derek Jack MD, have reviewed the above documentation for accuracy and completeness, and I agree with the above.

## 2019-10-03 NOTE — Progress Notes (Signed)
1340 Labs reviewed with and pt seen by Dr. Delton Coombes and pt approved for Velcade injection today per MD                                  Carly Wood tolerated Velcade injection well without complaints or incident. Pt discharged via wheelchair in satisfactory condition accompanied by her sisters

## 2019-10-03 NOTE — Progress Notes (Signed)
[facility] CSW Progress Notes  Met w patient, caregiver sister and sister Wilford Sports in exam room.  Caregiver sister has expressed increasing difficulty w caring for her two sisters in the home - is now wanting to explore options for out of home placement.  This patient was present for the discussion but stated she "preferred to listen" vs being an active participant.  Sister expressed that the burden of providing daily care to her two sisters is becoming increasingly difficult.  She would like to have more information about various out of home placement options.    Spoke w [employer], [employer] [facility] rep - [facility] was providing home health RN, Field seismologist and social work services for approx 4 weeks after referral from [name].  Services were terminated after 4 weeks after caregiver sister Deneise Lever) stated "we've had enough."  HH SW had completed an FL2 and had secured a placement for this patient and her sister.  At the past minute, the caregiver sister decided that she did not want to have sisters placed out of the home.  HH was in agreement that sisters did need placement; however, family refused.  [facility] [phone]) worked on this case.  [facility] has discharged patient and sister but can resume [place] services w appropriate orders and are willing to offer RN, SW and aide services as desired.  CSW has also agreed to find options in [place] and/or [place] for placement options.   CSW will update treatment team.  Edwyna Shell, LCSW Clinical Social Worker Phone:  [phone]

## 2019-10-03 NOTE — Patient Instructions (Signed)
[facility] Discharge Instructions for Patients Receiving Chemotherapy   Beginning January 23rd 2017 lab work for the Cancer Center will be done in the  Main lab at  on 1st floor. If you have a lab appointment with the Cancer Center please come in thru the  Main Entrance and check in at the main information desk   Today you received the following chemotherapy agents Velcade injection. Follow-up as scheduled  To help prevent nausea and vomiting after your treatment, we encourage you to take your nausea medication   If you develop nausea and vomiting, or diarrhea that is not controlled by your medication, call the clinic.  The clinic phone number is [phone]. Office hours are Monday-Friday 8:30am-5:00pm.  BELOW ARE SYMPTOMS THAT SHOULD BE REPORTED IMMEDIATELY:  *FEVER GREATER THAN 101.0 F  *CHILLS WITH OR WITHOUT FEVER  NAUSEA AND VOMITING THAT IS NOT CONTROLLED WITH YOUR NAUSEA MEDICATION  *UNUSUAL SHORTNESS OF BREATH  *UNUSUAL BRUISING OR BLEEDING  TENDERNESS IN MOUTH AND THROAT WITH OR WITHOUT PRESENCE OF ULCERS  *URINARY PROBLEMS  *BOWEL PROBLEMS  UNUSUAL RASH Items with * indicate a potential emergency and should be followed up as soon as possible. If you have an emergency after office hours please contact your primary care physician or go to the nearest emergency department.  Please call the clinic during office hours if you have any questions or concerns.   You may also contact the Patient Navigator at [phone] should you have any questions or need assistance in obtaining follow up care.      Resources For Cancer Patients and their Caregivers ? American Cancer Society: Can assist with transportation, wigs, general needs, runs Look Good Feel Better.        1-888-227-6333 ? Cancer Care: Provides financial assistance, online support groups, medication/co-pay assistance.  1-800-813-HOPE (4673) ? [facility] Assists [place] cancer patients and their families through emotional , educational and financial support.  336-427-4357 ? [place] DSS Where to apply for food stamps, Medicaid and utility assistance. 336-342-1394 ? [facility]: Transportation to medical appointments. 336-347-2287 ? Social Security Administration: May apply for disability if have a Stage IV cancer. 336-342-7796 1-800-772-1213 ? [place] Aging, Disability and Transit Services: Assists with nutrition, care and transit needs. 336-349-2343         

## 2019-10-03 NOTE — Progress Notes (Signed)
Patient was seen today by our clinical social worker.  She has assessed their needs and is trying to get them placement in assisted living facilities.    Home health was contacted and they advised that the orders have expired and they need new orders.  Orders placed for home health RN, Education officer, museum, aide.

## 2019-10-03 NOTE — Patient Instructions (Signed)
[facility] at [facility] Discharge Instructions  You were seen today by Dr. Katragadda. He went over your recent results. Dr. Katragadda will see you back in for labs and follow up.   Thank you for choosing [facility] at [facility] to provide your oncology and hematology care.  To afford each patient quality time with our provider, please arrive at least 15 minutes before your scheduled appointment time.   If you have a lab appointment with the Cancer Center please come in thru the Main Entrance and check in at the main information desk  You need to re-schedule your appointment should you arrive 10 or more minutes late.  We strive to give you quality time with our providers, and arriving late affects you and other patients whose appointments are after yours.  Also, if you no show three or more times for appointments you may be dismissed from the clinic at the providers discretion.     Again, thank you for choosing [facility].  Our hope is that these requests will decrease the amount of time that you wait before being seen by our physicians.       _____________________________________________________________  Should you have questions after your visit to  Cancer Center, please contact our office at [phone] between the hours of 8:00 a.m. and 4:30 p.m.  Voicemails left after 4:00 p.m. will not be returned until the following business day.  For prescription refill requests, have your pharmacy contact our office and allow 72 hours.    Cancer Center Support Programs:   > Cancer Support Group  2nd Tuesday of the month 1pm-2pm, Journey Room    

## 2019-10-03 NOTE — Progress Notes (Signed)
[facility] CSW Progress Notes   Call from Jon Billings, [facility] CSW [phone]).  She had worked with family in early June to get placement for patient and sister - per PCP and FL2, SNF was recommended level of care.  [place] CSW had contacted [facility] and [facility] SNF - neither had beds at the time. Caregiver sister did not allow [place] SW to seek placement at any other place in the county or elsewhere.  Wanted to "wait, hold steady at home."  Per [facility] SW, patient was appropriate for bedbound SNF placement, which would not involve Medicare (which requires rehab potential).  [facility] SW has been in the home and strongly feels that caregiver sister is unable to continue to provide care long term for patient in the home.  [facility] SW had also given caregiver a list of other options for in home care help.  [facility] SW will contact PCP to get FL2 redone as it has already expired.  Per [facility] SW, it may be difficult to find a place for both sisters to be together, they may need to consider separate placements.  Patient and family need to be willing to allow [facility] SW to seek placement outside of [facility] and [facility].  [facility] SW will await orders being received to reinstate services.   Edwyna Shell, LCSW Clinical Social Worker Phone:  [phone] Cell:  [phone]

## 2019-10-04 ENCOUNTER — Other Ambulatory Visit (HOSPITAL_COMMUNITY): Payer: Self-pay | Admitting: *Deleted

## 2019-10-04 DIAGNOSIS — C9000 Multiple myeloma not having achieved remission: Secondary | ICD-10-CM

## 2019-10-04 DIAGNOSIS — Z992 Dependence on renal dialysis: Secondary | ICD-10-CM | POA: Diagnosis not present

## 2019-10-04 DIAGNOSIS — N186 End stage renal disease: Secondary | ICD-10-CM | POA: Diagnosis not present

## 2019-10-05 LAB — PROTEIN ELECTROPHORESIS, SERUM

## 2019-10-05 LAB — KAPPA/LAMBDA LIGHT CHAINS

## 2019-10-06 ENCOUNTER — Telehealth: Payer: Self-pay | Admitting: General Practice

## 2019-10-06 DIAGNOSIS — N186 End stage renal disease: Secondary | ICD-10-CM | POA: Diagnosis not present

## 2019-10-06 DIAGNOSIS — Z992 Dependence on renal dialysis: Secondary | ICD-10-CM | POA: Diagnosis not present

## 2019-10-06 NOTE — Telephone Encounter (Signed)
[place] CSW Progress Notes  Call to Bard Herbert, admissions at [facility].  She is able to offer beds to patient and her sister, can admit on Monday august 2.  Sisters will not be in same room initially - when possible, facility is aware that sisters might like to be together and will try to facilitate that request.  Per Tami, she has spoken to caregiver sister Girard Cooter is unsure whether she wants to accept bed offer as sisters cannot be in same room.  CSW has also spoken w admissions at [facility] - they are not able to offer a bed to either sister at this time. [facility] admissions will speak w caregiver sister on Monday to determine her wishes.    Edwyna Shell, LCSW Clinical Social Worker Phone:  [phone] Cell:  [phone]

## 2019-10-07 DIAGNOSIS — N186 End stage renal disease: Secondary | ICD-10-CM | POA: Diagnosis not present

## 2019-10-07 DIAGNOSIS — Z992 Dependence on renal dialysis: Secondary | ICD-10-CM | POA: Diagnosis not present

## 2019-10-09 DIAGNOSIS — Z992 Dependence on renal dialysis: Secondary | ICD-10-CM | POA: Diagnosis not present

## 2019-10-09 DIAGNOSIS — N186 End stage renal disease: Secondary | ICD-10-CM | POA: Diagnosis not present

## 2019-10-10 ENCOUNTER — Other Ambulatory Visit: Payer: Self-pay

## 2019-10-10 ENCOUNTER — Encounter (HOSPITAL_COMMUNITY): Payer: Self-pay

## 2019-10-10 ENCOUNTER — Inpatient Hospital Stay (HOSPITAL_COMMUNITY): Payer: Medicare PPO | Attending: Hematology

## 2019-10-10 ENCOUNTER — Telehealth (HOSPITAL_COMMUNITY): Payer: Self-pay | Admitting: General Practice

## 2019-10-10 ENCOUNTER — Inpatient Hospital Stay (HOSPITAL_COMMUNITY): Payer: Medicare PPO

## 2019-10-10 ENCOUNTER — Inpatient Hospital Stay
Admission: RE | Admit: 2019-10-10 | Discharge: 2020-04-04 | Disposition: A | Discharge status: Nursing Home (Includes ICF) | Payer: Medicare PPO | Source: Ambulatory Visit | Attending: Internal Medicine | Admitting: Internal Medicine

## 2019-10-10 VITALS — BP 96/46 | HR 62 | Temp 97.0°F | Resp 18 | Wt 138.2 lb

## 2019-10-10 DIAGNOSIS — C9000 Multiple myeloma not having achieved remission: Secondary | ICD-10-CM | POA: Diagnosis not present

## 2019-10-10 DIAGNOSIS — Z5111 Encounter for antineoplastic chemotherapy: Secondary | ICD-10-CM | POA: Insufficient documentation

## 2019-10-10 DIAGNOSIS — W19XXXD Unspecified fall, subsequent encounter: Principal | ICD-10-CM

## 2019-10-10 LAB — CBC WITH DIFFERENTIAL/PLATELET
Abs Immature Granulocytes: 0.03 10*3/uL (ref 0.00–0.07)
Basophils Absolute: 0 10*3/uL (ref 0.0–0.1)
Basophils Relative: 0 %
Eosinophils Absolute: 0.1 10*3/uL (ref 0.0–0.5)
Eosinophils Relative: 2 %
HCT: 25.3 % — ABNORMAL LOW (ref 36.0–46.0)
Hemoglobin: 8.1 g/dL — ABNORMAL LOW (ref 12.0–15.0)
Immature Granulocytes: 1 %
Lymphocytes Relative: 14 %
Lymphs Abs: 0.9 10*3/uL (ref 0.7–4.0)
MCH: 35.5 pg — ABNORMAL HIGH (ref 26.0–34.0)
MCHC: 32 g/dL (ref 30.0–36.0)
MCV: 111 fL — ABNORMAL HIGH (ref 80.0–100.0)
Monocytes Absolute: 0.6 10*3/uL (ref 0.1–1.0)
Monocytes Relative: 9 %
Neutro Abs: 4.8 10*3/uL (ref 1.7–7.7)
Neutrophils Relative %: 74 %
Platelets: 233 10*3/uL (ref 150–400)
RBC: 2.28 MIL/uL — ABNORMAL LOW (ref 3.87–5.11)
RDW: 18 % — ABNORMAL HIGH (ref 11.5–15.5)
WBC: 6.5 10*3/uL (ref 4.0–10.5)
nRBC: 0 % (ref 0.0–0.2)

## 2019-10-10 LAB — COMPREHENSIVE METABOLIC PANEL
ALT: 11 U/L (ref 0–44)
AST: 21 U/L (ref 15–41)
Albumin: 3.7 g/dL (ref 3.5–5.0)
Alkaline Phosphatase: 76 U/L (ref 38–126)
Anion gap: 11 (ref 5–15)
BUN: 23 mg/dL (ref 8–23)
CO2: 29 mmol/L (ref 22–32)
Calcium: 8.7 mg/dL — ABNORMAL LOW (ref 8.9–10.3)
Chloride: 94 mmol/L — ABNORMAL LOW (ref 98–111)
Creatinine, Ser: 4.29 mg/dL — ABNORMAL HIGH (ref 0.44–1.00)
GFR calc Af Amer: 11 mL/min — ABNORMAL LOW (ref >60)
GFR calc non Af Amer: 9 mL/min — ABNORMAL LOW (ref >60)
Glucose, Bld: 164 mg/dL — ABNORMAL HIGH (ref 70–99)
Potassium: 4 mmol/L (ref 3.5–5.1)
Sodium: 134 mmol/L — ABNORMAL LOW (ref 135–145)
Total Bilirubin: 0.4 mg/dL (ref 0.3–1.2)
Total Protein: 6.6 g/dL (ref 6.5–8.1)

## 2019-10-10 MED ORDER — DEXAMETHASONE 4 MG PO TABS
10.0000 mg | ORAL_TABLET | Freq: Once | ORAL | Status: AC
  Administered 2019-10-10: 10 mg via ORAL
  Filled 2019-10-10: qty 3

## 2019-10-10 MED ORDER — BORTEZOMIB CHEMO SQ INJECTION 3.5 MG (2.5MG/ML)
1.3000 mg/m2 | Freq: Once | INTRAMUSCULAR | Status: AC
  Administered 2019-10-10: 2.25 mg via SUBCUTANEOUS
  Filled 2019-10-10: qty 0.9

## 2019-10-10 MED ORDER — PROCHLORPERAZINE MALEATE 10 MG PO TABS
10.0000 mg | ORAL_TABLET | Freq: Once | ORAL | Status: AC
  Administered 2019-10-10: 10 mg via ORAL
  Filled 2019-10-10: qty 1

## 2019-10-10 NOTE — Telephone Encounter (Signed)
[facility] CSW Progress Notes  Per T Morris, patient and sister will admit to [facility] today.  Edwyna Shell, LCSW Clinical Social Worker Phone:  [phone]

## 2019-10-11 DIAGNOSIS — Z992 Dependence on renal dialysis: Secondary | ICD-10-CM | POA: Diagnosis not present

## 2019-10-11 DIAGNOSIS — N186 End stage renal disease: Secondary | ICD-10-CM | POA: Diagnosis not present

## 2019-10-12 ENCOUNTER — Non-Acute Institutional Stay (SKILLED_NURSING_FACILITY): Payer: Self-pay | Admitting: Internal Medicine

## 2019-10-12 ENCOUNTER — Encounter: Payer: Self-pay | Admitting: Internal Medicine

## 2019-10-12 DIAGNOSIS — E1122 Type 2 diabetes mellitus with diabetic chronic kidney disease: Secondary | ICD-10-CM | POA: Insufficient documentation

## 2019-10-12 DIAGNOSIS — D509 Iron deficiency anemia, unspecified: Secondary | ICD-10-CM

## 2019-10-12 DIAGNOSIS — N184 Chronic kidney disease, stage 4 (severe): Secondary | ICD-10-CM

## 2019-10-12 DIAGNOSIS — M8580 Other specified disorders of bone density and structure, unspecified site: Secondary | ICD-10-CM

## 2019-10-12 DIAGNOSIS — C9000 Multiple myeloma not having achieved remission: Secondary | ICD-10-CM

## 2019-10-12 DIAGNOSIS — N186 End stage renal disease: Secondary | ICD-10-CM

## 2019-10-12 NOTE — Progress Notes (Signed)
NURSING HOME LOCATION:  [place] ROOM NUMBER:  148/D  CODE STATUS: Full Code  PCP:  Monico Blitz, MD  This is a comprehensive admission note to [facility] performed on this date less than 30 days from date of admission. Included are preadmission medical/surgical history; reconciled medication list; family history; social history and comprehensive review of systems.  Corrections and additions to the records were documented. Comprehensive physical exam was also performed. Additionally a clinical summary was entered for each active diagnosis pertinent to this admission in the Problem List to enhance continuity of care.  HPI: She is being admitted to the facility for permanent residence.  She has plasma cell myeloma and anemia without having received remission according to Dr. Tomie China documentation 10/03/2019.  She has been receiving Velcade  Infusion and dexamethasone orally scheduled 3 weeks on and 1 week off. Past history also includes infiltrating ductal carcinoma of the right breast for which mastectomy was performed in 2016.  She was also on chemotherapy for almost a year 2016-2017.  She has continued on Arimidex since 2017. She is on dialysis for ESRD Monday, Wednesday, and Friday.  Most recent renal function revealed a creatinine of 4.29 with GFR of 11. Mild confusion following dialysis with rapid recovery has been reported.  The patient expressed to her Oncologist that she did not want to be on dialysis for the rest of her life. She also has transfusion dependent anemia. She is an insulin-dependent diabetic.  The last A1c on record was 11/22/2017 with a value of 6.6%.  Glucoses prior to 6/1 had been markedly variable with a range of 95 low up to a high of 218.  Since that date glucoses have been much better controlled with a high of 164 . Her sister who has been her caregiver is no longer able to meet the needs of Huma and her other sister Wilford Sports in the home, prompting  the admission to this facility.  Additional past medical and surgical history includes essential hypertension, GERD, glaucoma, depression, mild neurocognitive deficit, and anxiety. She has had multiple vascular procedures in the context of ESRD.  Social history: Nondrinker, never smoked.  Family history: Extensive history reviewed; it is noncontributory in the context of her advanced age.   Review of systems: Her chief complaint is that she cannot sleep.  She does describe occasional edema.  She states she has been told she snores but denies any apnea.  The remainder of the extensive review of systems were answered with a monosyllabic "no". Constitutional: No fever, significant weight change, fatigue  Eyes: No redness, discharge, pain, vision change ENT/mouth: No nasal congestion, purulent discharge, earache, change in hearing, sore throat  Cardiovascular: No chest pain, palpitations, paroxysmal nocturnal dyspnea, claudication  Respiratory: No cough, sputum production, hemoptysis, DOE, significant snoring, apnea Gastrointestinal: No heartburn, dysphagia, abdominal pain, nausea /vomiting, rectal bleeding, melena, change in bowels Genitourinary: No dysuria, hematuria, pyuria, incontinence, nocturia Musculoskeletal: No joint stiffness, joint swelling, weakness, pain Dermatologic: No rash, pruritus, change in appearance of skin Neurologic: No dizziness, headache, syncope, seizures, numbness, tingling Psychiatric: No significant anxiety, depression,anorexia Endocrine: No change in hair/skin/nails, excessive thirst, excessive hunger, excessive urination  Hematologic/lymphatic: No significant bruising, lymphadenopathy, abnormal bleeding Allergy/immunology: No itchy/watery eyes, significant sneezing, urticaria, angioedema  Physical exam:  Pertinent or positive findings: Affect is markedly flat.  She has bilateral ptosis.  There is minimal asymmetry of the nasolabial folds.  Breath sounds are  decreased.  Pedal pulses are decreased.  Slight weakness  of the left upper extremity to opposition is suggested.  General appearance: Adequately nourished; no acute distress, increased work of breathing is present.   Lymphatic: No lymphadenopathy about the head, neck, axilla. Eyes: No conjunctival inflammation or lid edema is present. There is no scleral icterus. Ears:  External ear exam shows no significant lesions or deformities.   Nose:  External nasal examination shows no deformity or inflammation. Nasal mucosa are pink and moist without lesions, exudates Oral exam: Lips and gums are healthy appearing.There is no oropharyngeal erythema or exudate. Neck:  No thyromegaly, masses, tenderness noted.    Heart:  Normal rate and regular rhythm. S1 and S2 normal without gallop, murmur, click, rub.  Lungs:  without wheezes, rhonchi, rales, rubs. Abdomen: Bowel sounds are normal.  Abdomen is soft and nontender with no organomegaly, hernias, masses. GU: Deferred  Extremities:  No cyanosis, clubbing, edema. Neurologic exam: Balance, Rhomberg, finger to nose testing could not be completed due to clinical state Skin: Warm & dry w/o tenting. No significant lesions or rash.  See clinical summary under each active problem in the Problem List with associated updated therapeutic plan

## 2019-10-12 NOTE — Assessment & Plan Note (Addendum)
She has expressed a desire not to continue dialysis for the rest of her life.  Until final decision is made; dialysis will continue Monday, Wednesday, and Friday.

## 2019-10-12 NOTE — Assessment & Plan Note (Signed)
No bisphosphonate therapy due to ESRD on dialysis.

## 2019-10-12 NOTE — Patient Instructions (Signed)
See assessment and plan under each diagnosis in the problem list and acutely for this visit 

## 2019-10-12 NOTE — Assessment & Plan Note (Signed)
Oncology follow-up scheduled 6 weeks from last visit 7/27.  She will continue the protocol of Velcade infusion and dexamethasone orally 3 weeks on and 1 week off.

## 2019-10-12 NOTE — Assessment & Plan Note (Addendum)
Since early June glucoses have been under better control with a high of 164.  Prior to that values had been markedly variable with a low of 95 up to 218.  Last A1c on record was 6.6% on 11/22/2017.

## 2019-10-12 NOTE — Assessment & Plan Note (Addendum)
In the context of plasma cell myeloma not in remission.  Anemia has been transfusion dependent.  Anemia also is impacted by her ESRD.  Most recent 8/8 was 8.1/25.3 with an MCV of 111.0.  The last folate and B12 levels on record were 10/18/2018 with values of 40.6 and 594 respectively.These will be updated

## 2019-10-13 ENCOUNTER — Other Ambulatory Visit (HOSPITAL_COMMUNITY)
Admission: RE | Admit: 2019-10-13 | Discharge: 2019-10-13 | Disposition: A | Discharge status: Home / Self Care | Payer: Medicare PPO | Source: Ambulatory Visit | Attending: Internal Medicine | Admitting: Internal Medicine

## 2019-10-13 DIAGNOSIS — Z992 Dependence on renal dialysis: Secondary | ICD-10-CM | POA: Diagnosis not present

## 2019-10-13 DIAGNOSIS — E119 Type 2 diabetes mellitus without complications: Secondary | ICD-10-CM | POA: Diagnosis not present

## 2019-10-13 DIAGNOSIS — N186 End stage renal disease: Secondary | ICD-10-CM | POA: Diagnosis not present

## 2019-10-13 LAB — FOLATE: Folate: 20.9 ng/mL (ref >5.9)

## 2019-10-13 LAB — VITAMIN B12: Vitamin B-12: 1126 pg/mL — ABNORMAL HIGH (ref 180–914)

## 2019-10-13 LAB — HEMOGLOBIN A1C
Hgb A1c MFr Bld: 6 % — ABNORMAL HIGH (ref 4.8–5.6)
Mean Plasma Glucose: 125.5 mg/dL

## 2019-10-16 DIAGNOSIS — N186 End stage renal disease: Secondary | ICD-10-CM | POA: Diagnosis not present

## 2019-10-16 DIAGNOSIS — Z992 Dependence on renal dialysis: Secondary | ICD-10-CM | POA: Diagnosis not present

## 2019-10-17 ENCOUNTER — Encounter (HOSPITAL_COMMUNITY): Payer: Self-pay

## 2019-10-17 ENCOUNTER — Inpatient Hospital Stay (HOSPITAL_COMMUNITY): Payer: Medicare PPO

## 2019-10-17 VITALS — BP 106/45 | HR 73 | Temp 96.9°F | Resp 18 | Wt 135.8 lb

## 2019-10-17 DIAGNOSIS — C9000 Multiple myeloma not having achieved remission: Secondary | ICD-10-CM | POA: Diagnosis not present

## 2019-10-17 DIAGNOSIS — Z5111 Encounter for antineoplastic chemotherapy: Secondary | ICD-10-CM | POA: Diagnosis not present

## 2019-10-17 LAB — CBC WITH DIFFERENTIAL/PLATELET
Abs Immature Granulocytes: 0.02 10*3/uL (ref 0.00–0.07)
Basophils Absolute: 0 10*3/uL (ref 0.0–0.1)
Basophils Relative: 1 %
Eosinophils Absolute: 0.1 10*3/uL (ref 0.0–0.5)
Eosinophils Relative: 2 %
HCT: 26.1 % — ABNORMAL LOW (ref 36.0–46.0)
Hemoglobin: 8.3 g/dL — ABNORMAL LOW (ref 12.0–15.0)
Immature Granulocytes: 0 %
Lymphocytes Relative: 16 %
Lymphs Abs: 1.4 10*3/uL (ref 0.7–4.0)
MCH: 36.2 pg — ABNORMAL HIGH (ref 26.0–34.0)
MCHC: 31.8 g/dL (ref 30.0–36.0)
MCV: 114 fL — ABNORMAL HIGH (ref 80.0–100.0)
Monocytes Absolute: 0.8 10*3/uL (ref 0.1–1.0)
Monocytes Relative: 9 %
Neutro Abs: 6.5 10*3/uL (ref 1.7–7.7)
Neutrophils Relative %: 72 %
Platelets: 270 10*3/uL (ref 150–400)
RBC: 2.29 MIL/uL — ABNORMAL LOW (ref 3.87–5.11)
RDW: 18 % — ABNORMAL HIGH (ref 11.5–15.5)
WBC: 8.8 10*3/uL (ref 4.0–10.5)
nRBC: 0.2 % (ref 0.0–0.2)

## 2019-10-17 LAB — COMPREHENSIVE METABOLIC PANEL
ALT: 14 U/L (ref 0–44)
AST: 23 U/L (ref 15–41)
Albumin: 4 g/dL (ref 3.5–5.0)
Alkaline Phosphatase: 73 U/L (ref 38–126)
Anion gap: 12 (ref 5–15)
BUN: 28 mg/dL — ABNORMAL HIGH (ref 8–23)
CO2: 29 mmol/L (ref 22–32)
Calcium: 8.9 mg/dL (ref 8.9–10.3)
Chloride: 96 mmol/L — ABNORMAL LOW (ref 98–111)
Creatinine, Ser: 4.85 mg/dL — ABNORMAL HIGH (ref 0.44–1.00)
GFR calc Af Amer: 9 mL/min — ABNORMAL LOW (ref >60)
GFR calc non Af Amer: 8 mL/min — ABNORMAL LOW (ref >60)
Glucose, Bld: 190 mg/dL — ABNORMAL HIGH (ref 70–99)
Potassium: 3.9 mmol/L (ref 3.5–5.1)
Sodium: 137 mmol/L (ref 135–145)
Total Bilirubin: 0.7 mg/dL (ref 0.3–1.2)
Total Protein: 6.7 g/dL (ref 6.5–8.1)

## 2019-10-17 LAB — LACTATE DEHYDROGENASE: LDH: 160 U/L (ref 98–192)

## 2019-10-17 MED ORDER — DEXAMETHASONE 4 MG PO TABS
10.0000 mg | ORAL_TABLET | Freq: Once | ORAL | Status: AC
  Administered 2019-10-17: 10 mg via ORAL
  Filled 2019-10-17: qty 3

## 2019-10-17 MED ORDER — PROCHLORPERAZINE MALEATE 10 MG PO TABS
10.0000 mg | ORAL_TABLET | Freq: Once | ORAL | Status: AC
  Administered 2019-10-17: 10 mg via ORAL
  Filled 2019-10-17: qty 1

## 2019-10-17 MED ORDER — BORTEZOMIB CHEMO SQ INJECTION 3.5 MG (2.5MG/ML)
1.3000 mg/m2 | Freq: Once | INTRAMUSCULAR | Status: AC
  Administered 2019-10-17: 2.25 mg via SUBCUTANEOUS
  Filled 2019-10-17: qty 0.9

## 2019-10-17 NOTE — Progress Notes (Signed)
Serum creat 4.85 today.  Dr. Delton Coombes made aware of labs.   Patient tolerated Velcade injection with no complaints voiced.  Lab work reviewed.  See MAR for details.  Injection site clean and dry with no bruising or swelling noted.  Band aid applied.  VSS.  Patient left in satisfactory condition with no s/s of distress noted.

## 2019-10-18 DIAGNOSIS — N186 End stage renal disease: Secondary | ICD-10-CM | POA: Diagnosis not present

## 2019-10-18 DIAGNOSIS — Z992 Dependence on renal dialysis: Secondary | ICD-10-CM | POA: Diagnosis not present

## 2019-10-18 MED ORDER — LEUPROLIDE ACETATE 7.5 MG IM KIT
PACK | INTRAMUSCULAR | Status: AC
  Filled 2019-10-18: qty 7.5

## 2019-10-19 ENCOUNTER — Non-Acute Institutional Stay (SKILLED_NURSING_FACILITY): Payer: Self-pay | Admitting: Adult Health

## 2019-10-19 ENCOUNTER — Encounter: Payer: Self-pay | Admitting: Adult Health

## 2019-10-19 DIAGNOSIS — D631 Anemia in chronic kidney disease: Secondary | ICD-10-CM | POA: Insufficient documentation

## 2019-10-19 DIAGNOSIS — I120 Hypertensive chronic kidney disease with stage 5 chronic kidney disease or end stage renal disease: Secondary | ICD-10-CM

## 2019-10-19 DIAGNOSIS — C50911 Malignant neoplasm of unspecified site of right female breast: Secondary | ICD-10-CM

## 2019-10-19 DIAGNOSIS — E1122 Type 2 diabetes mellitus with diabetic chronic kidney disease: Secondary | ICD-10-CM | POA: Insufficient documentation

## 2019-10-19 DIAGNOSIS — F0151 Vascular dementia with behavioral disturbance: Secondary | ICD-10-CM

## 2019-10-19 DIAGNOSIS — F01518 Vascular dementia, unspecified severity, with other behavioral disturbance: Secondary | ICD-10-CM | POA: Insufficient documentation

## 2019-10-19 DIAGNOSIS — N186 End stage renal disease: Secondary | ICD-10-CM | POA: Insufficient documentation

## 2019-10-19 DIAGNOSIS — C9000 Multiple myeloma not having achieved remission: Secondary | ICD-10-CM

## 2019-10-19 DIAGNOSIS — Z992 Dependence on renal dialysis: Secondary | ICD-10-CM

## 2019-10-19 DIAGNOSIS — B009 Herpesviral infection, unspecified: Secondary | ICD-10-CM | POA: Insufficient documentation

## 2019-10-19 NOTE — Progress Notes (Signed)
Location:    [place] Room Number: 148/D Place of Service:  SNF (31)   CODE STATUS: Full Code  Allergies  Allergen Reactions  . Penicillins Other (See Comments)    Unsure of reaction Has patient had a PCN reaction causing immediate rash, facial/tongue/throat swelling, SOB or lightheadedness with hypotension: Unknown Has patient had a PCN reaction causing severe rash involving mucus membranes or skin necrosis: Unknown Has patient had a PCN reaction that required hospitalization: No Has patient had a PCN reaction occurring within the last 10 years: Unknown If all of the above answers are "NO", then may proceed with Cephalosporin use.    . Ace Inhibitors Cough and Other (See Comments)    Tongue swell  . Lisinopril Other (See Comments)    Tongue swell    Chief Complaint  Patient presents with  . Short Term Rehab (STR)         Diabetes with end stage renal disease (ESRD) chronic kidney disease with end stage renal disease on dialysis due to type 2 diabetes mellitus Hypertension due to end stage renal disease due to type 2 diabetes mellitus: weekly follow up for the first 30 days post hospitalization.     HPI:  She is a 84 year old long term resident of this facility being seen for the management of her chronic illnesses;Diabetes with end stage renal disease (ESRD)   Chronic kidney disease with end stage renal disease on dialysis due to type 2 diabetes mellitus   Hypertension due to end stage renal disease due to type 2 diabetes mellitus: there are no reports of uncontrolled pain. she has been wandering into others' rooms. There are no reports of agitation no reports of anxiety or depressive thoughts.    Past Medical History:  Diagnosis Date  . Anemia   . Anxiety   . Chronic kidney disease   . Chronic renal disease, stage 4, severely decreased glomerular filtration rate (GFR) between 15-29 mL/min/1.73 square meter (HCC) 08/22/2015  . Complication of  anesthesia    delirious after Breast Surgery  . Dementia ([place])    mild  . Depression   . Diabetes mellitus without complication ([place])    type II  . Dyspnea    with activity  . GERD (gastroesophageal reflux disease)   . Glaucoma   . Hypertension   . Pneumonia   . Stage 1 infiltrating ductal carcinoma of right female breast ([place]) 08/21/2015   ER+ PR+ HER 2 neu + (3+) T1cN0     Past Surgical History:  Procedure Laterality Date  . AV FISTULA PLACEMENT Left 11/22/2017   Procedure: ARTERIOVENOUS (AV) FISTULA CREATION LEFT ARM;  Surgeon: Elam Dutch, MD;  Location: [place];  Service: Vascular;  Laterality: Left;  . BIOPSY  08/07/2016   Procedure: BIOPSY;  Surgeon: Daneil Dolin, MD;  [facility];  Service: Endoscopy;;  gastric ulcer biopsy  . COLONOSCOPY    . ESOPHAGOGASTRODUODENOSCOPY N/A 08/07/2016   Procedure: ESOPHAGOGASTRODUODENOSCOPY (EGD);  Surgeon: Daneil Dolin, MD;  [facility];  Service: Endoscopy;  Laterality: N/A;  1215-rescheduled to 6/1 @ 2:30pm per Tretha Sciara  . ESOPHAGOGASTRODUODENOSCOPY N/A 11/27/2016   Procedure: ESOPHAGOGASTRODUODENOSCOPY (EGD);  Surgeon: Daneil Dolin, MD;  [facility];  Service: Endoscopy;  Laterality: N/A;  8:15am  . FISTULA SUPERFICIALIZATION Left 02/14/2018   Procedure: FISTULA SUPERFICIALIZATION LEFT ARM;  Surgeon: Angelia Mould, MD;  Location: [place];  Service: Vascular;  Laterality: Left;  .  FRACTURE SURGERY Right    ankle  . MALONEY DILATION N/A 08/07/2016   Procedure: [place] Minks DILATION;  Surgeon: Daneil Dolin, MD;  [facility];  Service: Endoscopy;  Laterality: N/A;  . MASTECTOMY, PARTIAL Right   . PERIPHERAL VASCULAR BALLOON ANGIOPLASTY Left 07/13/2019   Procedure: PERIPHERAL VASCULAR BALLOON ANGIOPLASTY;  Surgeon: Marty Heck, MD;  Location: [place] [facility];  Service: Cardiovascular;  Laterality: Left;  arm fistulogram  . PORT-A-CATH REMOVAL Left 11/22/2017   Procedure:  REMOVAL PORT-A-CATH LEFT CHEST;  Surgeon: Elam Dutch, MD;  [facility];  Service: Vascular;  Laterality: Left;  . RETINAL DETACHMENT SURGERY Right     Social History   Socioeconomic History  . Marital status: Single    Spouse name: Not on file  . Number of children: Not on file  . Years of education: Not on file  . Highest education level: Not on file  Occupational History  . Not on file  Tobacco Use  . Smoking status: Never Smoker  . Smokeless tobacco: Never Used  Vaping Use  . Vaping Use: Never used  Substance and Sexual Activity  . Alcohol use: No    Alcohol/week: 0.0 standard drinks  . Drug use: No  . Sexual activity: Not on file  Other Topics Concern  . Not on file  Social History Narrative  . Not on file   Social Determinants of Health   Financial Resource Strain:   . Difficulty of Paying Living Expenses:   Food Insecurity:   . Worried About Charity fundraiser in the Last Year:   . Arboriculturist in the Last Year:   Transportation Needs:   . Film/video editor (Medical):   Marland Kitchen Lack of Transportation (Non-Medical):   Physical Activity:   . Days of Exercise per Week:   . Minutes of Exercise per Session:   Stress:   . Feeling of Stress :   Social Connections:   . Frequency of Communication with Friends and Family:   . Frequency of Social Gatherings with Friends and Family:   . Attends Religious Services:   . Active Member of Clubs or Organizations:   . Attends Archivist Meetings:   Marland Kitchen Marital Status:   Intimate Partner Violence:   . Fear of Current or Ex-Partner:   . Emotionally Abused:   Marland Kitchen Physically Abused:   . Sexually Abused:    Family History  Problem Relation Age of Onset  . Multiple myeloma Sister   . Brain cancer Sister   . Dementia Mother        died at 56  . Stroke Mother   . Heart failure Mother   . Diabetes Mother   . Heart disease Father   . Prostate cancer Brother   . Colon cancer Neg Hx       VITAL  SIGNS BP 132/81   Pulse 71   Temp 98.1 F (36.7 C) (Oral)   Resp 18   Ht '5\' 3"'  (1.6 m)   Wt 141 lb 9.6 oz (64.2 kg)   BMI 25.08 kg/m   Outpatient Encounter Medications as of 10/19/2019  Medication Sig  . acetaminophen (TYLENOL) 500 MG tablet Take 500 mg by mouth every 6 (six) hours as needed for moderate pain or headache.   Marland Kitchen acyclovir (ZOVIRAX) 200 MG capsule TAKE 1 CAPSULE BY MOUTH TWICE DAILY  . amLODipine (NORVASC) 10 MG tablet Take 10 mg by mouth daily. Not taking on dialysis  days  . anastrozole (ARIMIDEX) 1 MG tablet TAKE 1 TABLET BY MOUTH DAILY  . aspirin EC 81 MG tablet Take 81 mg by mouth daily.  . Calcium Carbonate-Vitamin D (CALCIUM 600+D PO) Take 1 tablet by mouth daily.  . cloNIDine (CATAPRES) 0.3 MG tablet Take 0.3 mg by mouth 2 (two) times daily.   . Glucerna (GLUCERNA) LIQD Take 237 mLs by mouth daily.  . Multiple Vitamin (MULTIVITAMIN) capsule Take 1 capsule by mouth daily.  . NON FORMULARY Diet: _____ Regular, ___x___ NAS, _______Consistent Carbohydrate, _______NPO _____Other  . NON FORMULARY Wanderguard #3310 to ankle for safety awareness. Check placement and function qshift. Every Shift Day, Evening, Night   No facility-administered encounter medications on file as of 10/19/2019.     SIGNIFICANT DIAGNOSTIC EXAMS  LABS REVIEWED TODAY  10-13-19: hgb a1c 6.0; vit B 12: 1126 folate 20.9 10-17-19: wbc 8.8; hgb 8.3; hct 26.1; mcv 114.0 plt 270; glucose 190; bun 28; creat 4.85; k+ 3.9; na++ 127; ca 8.9 liver normal albumin 4.0   Review of Systems  Constitutional: Negative for malaise/fatigue.  Respiratory: Negative for cough and shortness of breath.   Cardiovascular: Negative for chest pain, palpitations and leg swelling.  Gastrointestinal: Negative for abdominal pain, constipation and heartburn.  Musculoskeletal: Negative for back pain, joint pain and myalgias.  Skin: Negative.   Neurological: Negative for dizziness.  Psychiatric/Behavioral: The patient is  not nervous/anxious.    Physical Exam Constitutional:      General: She is not in acute distress.    Appearance: She is well-developed. She is not diaphoretic.  Neck:     Thyroid: No thyromegaly.  Cardiovascular:     Rate and Rhythm: Normal rate and regular rhythm.     Heart sounds: Normal heart sounds.     Comments: Pulses are faint  Pulmonary:     Effort: Pulmonary effort is normal. No respiratory distress.     Breath sounds: Normal breath sounds.  Abdominal:     General: Bowel sounds are normal. There is no distension.     Palpations: Abdomen is soft.     Tenderness: There is no abdominal tenderness.  Musculoskeletal:        General: Normal range of motion.     Cervical back: Neck supple.     Right lower leg: No edema.     Left lower leg: No edema.  Lymphadenopathy:     Cervical: No cervical adenopathy.  Skin:    General: Skin is warm and dry.     Comments: Left arm a/v fistula: + thrill + bruit   Neurological:     Mental Status: She is alert. Mental status is at baseline.  Psychiatric:        Mood and Affect: Mood normal.       ASSESSMENT/ PLAN:  TODAY  1. Diabetes with end stage renal disease (ESRD) is stable hgb a1c 6.0 will monitor   2. Chronic kidney disease with end stage renal disease on dialysis due to type 2 diabetes mellitus is stable is on hemodialysis three days weekly will monitor   3. Hypertension due to end stage renal disease due to type 2 diabetes mellitus: is stable b/p 132/81 will continue norvasc 10 mg four times weekly and clonidine 0.3 mg twice daily  daily asa 81 mg daily  4. Stage 1 infiltrating ductal carcinoma of right female breast is stable is followed by oncology will continue armidex 1 mg daily  5. Multiple myeloma not achieving remission: is without change is  followed by oncology  6. Anemia due to end stage renal disease: is stable hgb 8.3 will monitor   7. Herpes: no reports of outbreaks will continue acyclovir 200 mg twice  daily   8.  Vascular dementia with behavioral disturbance: is stable weight is 141 pounds will moinotr    MD is aware of resident's narcotic use and is in agreement with current plan of care. We will attempt to wean resident as appropriate.  Ok Edwards NP [facility]  Contact [phone] Monday through Friday 8am- 5pm  After hours call [phone]

## 2019-10-20 DIAGNOSIS — N186 End stage renal disease: Secondary | ICD-10-CM | POA: Diagnosis not present

## 2019-10-20 DIAGNOSIS — Z992 Dependence on renal dialysis: Secondary | ICD-10-CM | POA: Diagnosis not present

## 2019-10-23 DIAGNOSIS — Z992 Dependence on renal dialysis: Secondary | ICD-10-CM | POA: Diagnosis not present

## 2019-10-23 DIAGNOSIS — N186 End stage renal disease: Secondary | ICD-10-CM | POA: Diagnosis not present

## 2019-10-24 DIAGNOSIS — R488 Other symbolic dysfunctions: Secondary | ICD-10-CM | POA: Diagnosis not present

## 2019-10-24 DIAGNOSIS — F0390 Unspecified dementia without behavioral disturbance: Secondary | ICD-10-CM | POA: Diagnosis not present

## 2019-10-24 DIAGNOSIS — C9000 Multiple myeloma not having achieved remission: Secondary | ICD-10-CM | POA: Diagnosis not present

## 2019-10-24 DIAGNOSIS — E119 Type 2 diabetes mellitus without complications: Secondary | ICD-10-CM | POA: Diagnosis not present

## 2019-10-25 ENCOUNTER — Encounter: Payer: Self-pay | Admitting: Adult Health

## 2019-10-25 ENCOUNTER — Non-Acute Institutional Stay (SKILLED_NURSING_FACILITY): Payer: Self-pay | Admitting: Adult Health

## 2019-10-25 DIAGNOSIS — N186 End stage renal disease: Secondary | ICD-10-CM

## 2019-10-25 DIAGNOSIS — I120 Hypertensive chronic kidney disease with stage 5 chronic kidney disease or end stage renal disease: Secondary | ICD-10-CM

## 2019-10-25 DIAGNOSIS — Z992 Dependence on renal dialysis: Secondary | ICD-10-CM | POA: Diagnosis not present

## 2019-10-25 DIAGNOSIS — E1122 Type 2 diabetes mellitus with diabetic chronic kidney disease: Secondary | ICD-10-CM

## 2019-10-25 NOTE — Progress Notes (Signed)
Location:    [place] Room Number: 148/D Place of Service:  SNF (31)   CODE STATUS: Full Code  Allergies  Allergen Reactions   Penicillins Other (See Comments)    Unsure of reaction Has patient had a PCN reaction causing immediate rash, facial/tongue/throat swelling, SOB or lightheadedness with hypotension: Unknown Has patient had a PCN reaction causing severe rash involving mucus membranes or skin necrosis: Unknown Has patient had a PCN reaction that required hospitalization: No Has patient had a PCN reaction occurring within the last 10 years: Unknown If all of the above answers are "NO", then may proceed with Cephalosporin use.     Ace Inhibitors Cough and Other (See Comments)    Tongue swell   Lisinopril Other (See Comments)    Tongue swell    Chief Complaint  Patient presents with   Short Term Rehab (STR)          Diabetes with end stage renal disease (ESRD)   Chronic kidney disease with end stage renal disease on dialysis due to type 2 diabetes mellitus / dependence on dialysis  Hypertension due to end stage renal disease due to type 2 diabetes mellitus   Weekly follow up for the first 30 days post admission     HPI:  She is a 84 year old long term resident of this facility being seen for the management of her chronic illnesses: Diabetes with end stage renal disease (ESRD)    Chronic kidney disease with end stage renal disease on dialysis due to type 2 diabetes mellitus / dependence on dialysis   Hypertension due to end stage renal disease due to type 2 diabetes mellitus.  There are no reports of uncontrolled pain. There are no reports of constipation; heart burn.   Past Medical History:  Diagnosis Date   Anemia    Anxiety    Chronic kidney disease    Chronic renal disease, stage 4, severely decreased glomerular filtration rate (GFR) between 15-29 mL/min/1.73 square meter ([place]) 9/38/1829   Complication of anesthesia    delirious after  Breast Surgery   Dementia ([place])    mild   Depression    Diabetes mellitus without complication ([place])    type II   Dyspnea    with activity   GERD (gastroesophageal reflux disease)    Glaucoma    Hypertension    Pneumonia    Stage 1 infiltrating ductal carcinoma of right female breast ([place]) 08/21/2015   ER+ PR+ HER 2 neu + (3+) T1cN0     Past Surgical History:  Procedure Laterality Date   AV FISTULA PLACEMENT Left 11/22/2017   Procedure: ARTERIOVENOUS (AV) FISTULA CREATION LEFT ARM;  Surgeon: Elam Dutch, MD;  Location: [facility];  Service: Vascular;  Laterality: Left;   BIOPSY  08/07/2016   Procedure: BIOPSY;  Surgeon: Daneil Dolin, MD;  [facility];  Service: Endoscopy;;  gastric ulcer biopsy   COLONOSCOPY     ESOPHAGOGASTRODUODENOSCOPY N/A 08/07/2016   Procedure: ESOPHAGOGASTRODUODENOSCOPY (EGD);  Surgeon: Daneil Dolin, MD;  [facility];  Service: Endoscopy;  Laterality: N/A;  1215-rescheduled to 6/1 @ 2:30pm per Tretha Sciara   ESOPHAGOGASTRODUODENOSCOPY N/A 11/27/2016   Procedure: ESOPHAGOGASTRODUODENOSCOPY (EGD);  Surgeon: Daneil Dolin, MD;  [facility];  Service: Endoscopy;  Laterality: N/A;  8:15am   FISTULA SUPERFICIALIZATION Left 02/14/2018   Procedure: FISTULA SUPERFICIALIZATION LEFT ARM;  Surgeon: Angelia Mould, MD;  Location: [place];  Service: Vascular;  Laterality: Left;   FRACTURE SURGERY Right    ankle   MALONEY DILATION N/A 08/07/2016   Procedure: Venia Minks DILATION;  Surgeon: Daneil Dolin, MD;  [facility];  Service: Endoscopy;  Laterality: N/A;   MASTECTOMY, PARTIAL Right    PERIPHERAL VASCULAR BALLOON ANGIOPLASTY Left 07/13/2019   Procedure: PERIPHERAL VASCULAR BALLOON ANGIOPLASTY;  Surgeon: Marty Heck, MD;  Location: [place];  Service: Cardiovascular;  Laterality: Left;  arm fistulogram   PORT-A-CATH REMOVAL Left 11/22/2017   Procedure: REMOVAL PORT-A-CATH LEFT CHEST;   Surgeon: Elam Dutch, MD;  Location: [facility] OR;  Service: Vascular;  Laterality: Left;   RETINAL DETACHMENT SURGERY Right     Social History   Socioeconomic History   Marital status: Single    Spouse name: Not on file   Number of children: Not on file   Years of education: Not on file   Highest education level: Not on file  Occupational History   Not on file  Tobacco Use   Smoking status: Never Smoker   Smokeless tobacco: Never Used  Vaping Use   Vaping Use: Never used  Substance and Sexual Activity   Alcohol use: No    Alcohol/week: 0.0 standard drinks   Drug use: No   Sexual activity: Not on file  Other Topics Concern   Not on file  Social History Narrative   Not on file   Social Determinants of Health   Financial Resource Strain:    Difficulty of Paying Living Expenses:   Food Insecurity:    Worried About Charity fundraiser in the Last Year:    Arboriculturist in the Last Year:   Transportation Needs:    Film/video editor (Medical):    Lack of Transportation (Non-Medical):   Physical Activity:    Days of Exercise per Week:    Minutes of Exercise per Session:   Stress:    Feeling of Stress :   Social Connections:    Frequency of Communication with Friends and Family:    Frequency of Social Gatherings with Friends and Family:    Attends Religious Services:    Active Member of Clubs or Organizations:    Attends Music therapist:    Marital Status:   Intimate Partner Violence:    Fear of Current or Ex-Partner:    Emotionally Abused:    Physically Abused:    Sexually Abused:    Family History  Problem Relation Age of Onset   Multiple myeloma Sister    Brain cancer Sister    Dementia Mother        died at 60   Stroke Mother    Heart failure Mother    Diabetes Mother    Heart disease Father    Prostate cancer Brother    Colon cancer Neg Hx       VITAL SIGNS BP (!) 116/56    Pulse 75     Temp 98.8 F (37.1 C) (Oral)    Resp 20    Ht _0  (1.6 m)    Wt 136 lb 6.4 oz (61.9 kg)    BMI 24.16 kg/m   Outpatient Encounter Medications as of 10/25/2019  Medication Sig   acetaminophen (TYLENOL) 500 MG tablet Take 500 mg by mouth every 6 (six) hours as needed for moderate pain or headache.    acyclovir (ZOVIRAX) 200 MG capsule TAKE 1 CAPSULE BY MOUTH TWICE DAILY   amLODipine (NORVASC) 10 MG  tablet Take 10 mg by mouth daily. Not taking on dialysis days   anastrozole (ARIMIDEX) 1 MG tablet TAKE 1 TABLET BY MOUTH DAILY   aspirin EC 81 MG tablet Take 81 mg by mouth daily.   Calcium Carbonate-Vitamin D (CALCIUM 600+D PO) Take 1 tablet by mouth daily.   cloNIDine (CATAPRES) 0.3 MG tablet Take 0.3 mg by mouth 2 (two) times daily.    Glucerna (GLUCERNA) LIQD Take 237 mLs by mouth daily.   melatonin 3 MG TABS tablet Take 6 mg by mouth at bedtime. For Sleep   Multiple Vitamin (MULTIVITAMIN) capsule Take 1 capsule by mouth daily.   NON FORMULARY Diet: _____ Regular, ___x___ NAS, _______Consistent Carbohydrate, _______NPO _____Other   Augusto Gamble #3310 to ankle for safety awareness. Check placement and function qshift. Every Shift Day, Evening, Night   No facility-administered encounter medications on file as of 10/25/2019.     SIGNIFICANT DIAGNOSTIC EXAMS   LABS REVIEWED PREVIOUS  10-13-19: hgb a1c 6.0; vit B 12: 1126 folate 20.9 10-17-19: wbc 8.8; hgb 8.3; hct 26.1; mcv 114.0 plt 270; glucose 190; bun 28; creat 4.85; k+ 3.9; na++ 127; ca 8.9 liver normal albumin 4.0   NO NEW LABS.    Review of Systems  Constitutional: Negative for malaise/fatigue.  Respiratory: Negative for cough and shortness of breath.   Cardiovascular: Negative for chest pain, palpitations and leg swelling.  Gastrointestinal: Negative for abdominal pain, constipation and heartburn.  Musculoskeletal: Negative for back pain, joint pain and myalgias.  Skin: Negative.   Neurological:  Negative for dizziness.  Psychiatric/Behavioral: The patient is not nervous/anxious.     Physical Exam Constitutional:      General: She is not in acute distress.    Appearance: She is well-developed. She is not diaphoretic.  Neck:     Thyroid: No thyromegaly.  Cardiovascular:     Rate and Rhythm: Normal rate and regular rhythm.     Heart sounds: Normal heart sounds.     Comments: Pedal pulses  Pulmonary:     Effort: Pulmonary effort is normal. No respiratory distress.     Breath sounds: Normal breath sounds.  Abdominal:     General: Bowel sounds are normal. There is no distension.     Palpations: Abdomen is soft.     Tenderness: There is no abdominal tenderness.  Musculoskeletal:        General: Normal range of motion.     Cervical back: Neck supple.     Right lower leg: No edema.     Left lower leg: No edema.  Lymphadenopathy:     Cervical: No cervical adenopathy.  Skin:    General: Skin is warm and dry.     Comments: Left arm a/v fistula: + thrill + bruit    Neurological:     Mental Status: She is alert. Mental status is at baseline.  Psychiatric:        Mood and Affect: Mood normal.       ASSESSMENT/ PLAN:  TODAY  1. Diabetes with end stage renal disease (ESRD) is stable hgb a1c 6.0 will monitor  2. Chronic kidney disease with end stage renal disease on dialysis due to type 2 diabetes mellitus / dependence on dialysis is stable will continue dialysis three days per week  3. Hypertension due to end stage renal disease due to type 2 diabetes mellitus is stable b/p 116/56 will continue norvasc 10 mg four times weekly and clonidine 0.3 mg twice daily asa 81 mg daily  PREVIOUS   4. Stage 1 infiltrating ductal carcinoma of right female breast is stable is followed by oncology will continue armidex 1 mg daily  5. Multiple myeloma not achieving remission: is without change is followed by oncology  6. Anemia due to end stage renal disease: is stable hgb 8.3 will  monitor   7. Herpes: no reports of outbreaks will continue acyclovir 200 mg twice daily   8.  Vascular dementia with behavioral disturbance: is stable weight is 136 pounds will moinotr            MD is aware of resident's narcotic use and is in agreement with current plan of care. We will attempt to wean resident as appropriate.  Ok Edwards NP [facility]  Contact [phone] Monday through Friday 8am- 5pm  After hours call [phone]

## 2019-10-26 ENCOUNTER — Encounter: Payer: Self-pay | Admitting: Adult Health

## 2019-10-26 ENCOUNTER — Non-Acute Institutional Stay (SKILLED_NURSING_FACILITY): Payer: Medicare PPO | Admitting: Adult Health

## 2019-10-26 DIAGNOSIS — Z992 Dependence on renal dialysis: Secondary | ICD-10-CM

## 2019-10-26 DIAGNOSIS — F01518 Vascular dementia, unspecified severity, with other behavioral disturbance: Secondary | ICD-10-CM

## 2019-10-26 DIAGNOSIS — E1122 Type 2 diabetes mellitus with diabetic chronic kidney disease: Secondary | ICD-10-CM | POA: Diagnosis not present

## 2019-10-26 DIAGNOSIS — C9000 Multiple myeloma not having achieved remission: Secondary | ICD-10-CM

## 2019-10-26 DIAGNOSIS — F0151 Vascular dementia with behavioral disturbance: Secondary | ICD-10-CM

## 2019-10-26 DIAGNOSIS — N186 End stage renal disease: Secondary | ICD-10-CM

## 2019-10-26 NOTE — Progress Notes (Signed)
Location:    [place] Room Number: 148/D Place of Service:  SNF (31)   CODE STATUS: Full Code  Allergies  Allergen Reactions  . Penicillins Other (See Comments)    Unsure of reaction Has patient had a PCN reaction causing immediate rash, facial/tongue/throat swelling, SOB or lightheadedness with hypotension: Unknown Has patient had a PCN reaction causing severe rash involving mucus membranes or skin necrosis: Unknown Has patient had a PCN reaction that required hospitalization: No Has patient had a PCN reaction occurring within the last 10 years: Unknown If all of the above answers are "NO", then may proceed with Cephalosporin use.    . Ace Inhibitors Cough and Other (See Comments)    Tongue swell  . Lisinopril Other (See Comments)    Tongue swell    Chief Complaint  Patient presents with  . Acute Visit    care plan meeting     HPI:  We have come together for her care plan meeting. Family present. BIMS 11/15 mood 2/30 BCAT 23/50. She has had one fall without injury; she has poor vision nearly blind in right eye due to retinal detachment. She requires supervision with her adls. She is occasionally incontinent of bladder and bowel. She does have a wander guard on; has had less roaming since moving in with her sister. There are no reports of uncontrolled pain. She continues to be followed for her chronic illnesses including: Chronic kidney disease with chronic renal disease on dialysis due to type 2 diabetes mellitus Vascular dementia without behavioral disturbance Multiple myeloma not having achieved remission  Past Medical History:  Diagnosis Date  . Anemia   . Anxiety   . Chronic kidney disease   . Chronic renal disease, stage 4, severely decreased glomerular filtration rate (GFR) between 15-29 mL/min/1.73 square meter ([facility]) 08/22/2015  . Complication of anesthesia    delirious after Breast Surgery  . Dementia ([place])    mild  . Depression   .  Diabetes mellitus without complication ([place])    type II  . Dyspnea    with activity  . GERD (gastroesophageal reflux disease)   . Glaucoma   . Hypertension   . Pneumonia   . Stage 1 infiltrating ductal carcinoma of right female breast ([place]) 08/21/2015   ER+ PR+ HER 2 neu + (3+) T1cN0     Past Surgical History:  Procedure Laterality Date  . AV FISTULA PLACEMENT Left 11/22/2017   Procedure: ARTERIOVENOUS (AV) FISTULA CREATION LEFT ARM;  Surgeon: Elam Dutch, MD;  Location: [place];  Service: Vascular;  Laterality: Left;  . BIOPSY  08/07/2016   Procedure: BIOPSY;  Surgeon: Daneil Dolin, MD;  [facility];  Service: Endoscopy;;  gastric ulcer biopsy  . COLONOSCOPY    . ESOPHAGOGASTRODUODENOSCOPY N/A 08/07/2016   Procedure: ESOPHAGOGASTRODUODENOSCOPY (EGD);  Surgeon: Daneil Dolin, MD;  [facility];  Service: Endoscopy;  Laterality: N/A;  1215-rescheduled to 6/1 @ 2:30pm per Tretha Sciara  . ESOPHAGOGASTRODUODENOSCOPY N/A 11/27/2016   Procedure: ESOPHAGOGASTRODUODENOSCOPY (EGD);  Surgeon: Daneil Dolin, MD;  [facility];  Service: Endoscopy;  Laterality: N/A;  8:15am  . FISTULA SUPERFICIALIZATION Left 02/14/2018   Procedure: FISTULA SUPERFICIALIZATION LEFT ARM;  Surgeon: Angelia Mould, MD;  Location: [place];  Service: Vascular;  Laterality: Left;  . FRACTURE SURGERY Right    ankle  . MALONEY DILATION N/A 08/07/2016   Procedure: Venia Minks DILATION;  Surgeon: Daneil Dolin, MD;  [facility];  Service: Endoscopy;  Laterality: N/A;  . MASTECTOMY, PARTIAL Right   . PERIPHERAL VASCULAR BALLOON ANGIOPLASTY Left 07/13/2019   Procedure: PERIPHERAL VASCULAR BALLOON ANGIOPLASTY;  Surgeon: Cephus Shelling, MD;  [facility];  Service: Cardiovascular;  Laterality: Left;  arm fistulogram  . PORT-A-CATH REMOVAL Left 11/22/2017   Procedure: REMOVAL PORT-A-CATH LEFT CHEST;  Surgeon: Sherren Kerns, MD;  Location: [facility] OR;  Service:  Vascular;  Laterality: Left;  . RETINAL DETACHMENT SURGERY Right     Social History   Socioeconomic History  . Marital status: Single    Spouse name: Not on file  . Number of children: Not on file  . Years of education: Not on file  . Highest education level: Not on file  Occupational History  . Not on file  Tobacco Use  . Smoking status: Never Smoker  . Smokeless tobacco: Never Used  Vaping Use  . Vaping Use: Never used  Substance and Sexual Activity  . Alcohol use: No    Alcohol/week: 0.0 standard drinks  . Drug use: No  . Sexual activity: Not on file  Other Topics Concern  . Not on file  Social History Narrative  . Not on file   Social Determinants of Health   Financial Resource Strain:   . Difficulty of Paying Living Expenses: Not on file  Food Insecurity:   . Worried About Programme researcher, broadcasting/film/video in the Last Year: Not on file  . Ran Out of Food in the Last Year: Not on file  Transportation Needs:   . Lack of Transportation (Medical): Not on file  . Lack of Transportation (Non-Medical): Not on file  Physical Activity:   . Days of Exercise per Week: Not on file  . Minutes of Exercise per Session: Not on file  Stress:   . Feeling of Stress : Not on file  Social Connections:   . Frequency of Communication with Friends and Family: Not on file  . Frequency of Social Gatherings with Friends and Family: Not on file  . Attends Religious Services: Not on file  . Active Member of Clubs or Organizations: Not on file  . Attends Banker Meetings: Not on file  . Marital Status: Not on file  Intimate Partner Violence:   . Fear of Current or Ex-Partner: Not on file  . Emotionally Abused: Not on file  . Physically Abused: Not on file  . Sexually Abused: Not on file   Family History  Problem Relation Age of Onset  . Multiple myeloma Sister   . Brain cancer Sister   . Dementia Mother        died at 25  . Stroke Mother   . Heart failure Mother   . Diabetes  Mother   . Heart disease Father   . Prostate cancer Brother   . Colon cancer Neg Hx       VITAL SIGNS BP 120/65   Pulse 63   Temp (!) 97.4 F (36.3 C) (Oral)   Resp 20   Ht 5\' 3"  (1.6 m)   Wt 141 lb 9.6 oz (64.2 kg)   SpO2 98%   BMI 25.08 kg/m   Outpatient Encounter Medications as of 10/26/2019  Medication Sig  . acetaminophen (TYLENOL) 500 MG tablet Take 500 mg by mouth every 6 (six) hours as needed for moderate pain or headache.   10/28/2019 acyclovir (ZOVIRAX) 200 MG capsule TAKE 1 CAPSULE BY MOUTH TWICE DAILY  . amLODipine (NORVASC)  10 MG tablet Take 10 mg by mouth daily. Not taking on dialysis days  . anastrozole (ARIMIDEX) 1 MG tablet TAKE 1 TABLET BY MOUTH DAILY  . aspirin EC 81 MG tablet Take 81 mg by mouth daily.  . Calcium Carbonate-Vitamin D (CALCIUM 600+D PO) Take 1 tablet by mouth daily.  . cloNIDine (CATAPRES) 0.3 MG tablet Take 0.3 mg by mouth 2 (two) times daily.   . Glucerna (GLUCERNA) LIQD Take 237 mLs by mouth daily.  . melatonin 3 MG TABS tablet Take 6 mg by mouth at bedtime. For Sleep  . Multiple Vitamin (MULTIVITAMIN) capsule Take 1 capsule by mouth daily.  . NON FORMULARY Diet: _____ Regular, ___x___ NAS, _______Consistent Carbohydrate, _______NPO _____Other  . NON FORMULARY Wanderguard #3310 to ankle for safety awareness. Check placement and function qshift. Every Shift Day, Evening, Night   No facility-administered encounter medications on file as of 10/26/2019.     SIGNIFICANT DIAGNOSTIC EXAMS   LABS REVIEWED PREVIOUS  10-13-19: hgb a1c 6.0; vit B 12: 1126 folate 20.9 10-17-19: wbc 8.8; hgb 8.3; hct 26.1; mcv 114.0 plt 270; glucose 190; bun 28; creat 4.85; k+ 3.9; na++ 127; ca 8.9 liver normal albumin 4.0   NO NEW LABS.   Review of Systems  Constitutional: Negative for malaise/fatigue.  Respiratory: Negative for cough and shortness of breath.   Cardiovascular: Negative for chest pain, palpitations and leg swelling.  Gastrointestinal: Negative for  abdominal pain, constipation and heartburn.  Musculoskeletal: Negative for back pain, joint pain and myalgias.  Skin: Negative.   Neurological: Negative for dizziness.  Psychiatric/Behavioral: The patient is not nervous/anxious.     Physical Exam Constitutional:      General: She is not in acute distress.    Appearance: She is well-developed. She is not diaphoretic.  Neck:     Thyroid: No thyromegaly.  Cardiovascular:     Rate and Rhythm: Normal rate and regular rhythm.     Heart sounds: Normal heart sounds.     Comments: Pedal pulses faint  Pulmonary:     Effort: Pulmonary effort is normal. No respiratory distress.     Breath sounds: Normal breath sounds.  Abdominal:     General: Bowel sounds are normal. There is no distension.     Palpations: Abdomen is soft.     Tenderness: There is no abdominal tenderness.  Musculoskeletal:        General: Normal range of motion.     Cervical back: Neck supple.     Right lower leg: No edema.     Left lower leg: No edema.  Lymphadenopathy:     Cervical: No cervical adenopathy.  Skin:    General: Skin is warm and dry.     Comments: Left arm a/v fistula: + thrill + bruit     Neurological:     Mental Status: She is alert. Mental status is at baseline.  Psychiatric:        Mood and Affect: Mood normal.       ASSESSMENT/ PLAN:  TODAY  1. Chronic kidney disease with chronic renal disease on dialysis due to type 2 diabetes mellitus 2. Vascular dementia without behavioral disturbance 3. Multiple myeloma not having achieved remission  Will continue current medications Will continue current plan of care Will continue to monitor her status.   MD is aware of resident's narcotic use and is in agreement with current plan of care. We will attempt to wean resident as appropriate.  Ok Edwards NP [facility]  Contact [phone] Monday through Friday 8am- 5pm  After hours call [phone]

## 2019-10-27 DIAGNOSIS — Z992 Dependence on renal dialysis: Secondary | ICD-10-CM | POA: Diagnosis not present

## 2019-10-27 DIAGNOSIS — C9000 Multiple myeloma not having achieved remission: Secondary | ICD-10-CM | POA: Diagnosis not present

## 2019-10-27 DIAGNOSIS — Z1159 Encounter for screening for other viral diseases: Secondary | ICD-10-CM | POA: Diagnosis not present

## 2019-10-27 DIAGNOSIS — N186 End stage renal disease: Secondary | ICD-10-CM | POA: Diagnosis not present

## 2019-10-29 ENCOUNTER — Other Ambulatory Visit (HOSPITAL_COMMUNITY): Payer: Self-pay | Admitting: Hematology

## 2019-10-30 DIAGNOSIS — Z1159 Encounter for screening for other viral diseases: Secondary | ICD-10-CM | POA: Diagnosis not present

## 2019-10-30 DIAGNOSIS — F0390 Unspecified dementia without behavioral disturbance: Secondary | ICD-10-CM | POA: Diagnosis not present

## 2019-10-30 DIAGNOSIS — R488 Other symbolic dysfunctions: Secondary | ICD-10-CM | POA: Diagnosis not present

## 2019-10-30 DIAGNOSIS — E119 Type 2 diabetes mellitus without complications: Secondary | ICD-10-CM | POA: Diagnosis not present

## 2019-10-30 DIAGNOSIS — C9000 Multiple myeloma not having achieved remission: Secondary | ICD-10-CM | POA: Diagnosis not present

## 2019-10-30 DIAGNOSIS — Z992 Dependence on renal dialysis: Secondary | ICD-10-CM | POA: Diagnosis not present

## 2019-10-30 DIAGNOSIS — N186 End stage renal disease: Secondary | ICD-10-CM | POA: Diagnosis not present

## 2019-10-31 ENCOUNTER — Encounter (HOSPITAL_COMMUNITY): Payer: Self-pay

## 2019-10-31 ENCOUNTER — Other Ambulatory Visit (HOSPITAL_COMMUNITY): Payer: Medicare PPO

## 2019-10-31 ENCOUNTER — Inpatient Hospital Stay (HOSPITAL_COMMUNITY): Payer: Medicare PPO

## 2019-10-31 ENCOUNTER — Ambulatory Visit (HOSPITAL_COMMUNITY): Payer: Medicare PPO | Admitting: Hematology

## 2019-10-31 VITALS — BP 106/45 | HR 88 | Temp 97.5°F | Resp 18 | Wt 129.7 lb

## 2019-10-31 DIAGNOSIS — C9000 Multiple myeloma not having achieved remission: Secondary | ICD-10-CM | POA: Diagnosis not present

## 2019-10-31 DIAGNOSIS — R488 Other symbolic dysfunctions: Secondary | ICD-10-CM | POA: Diagnosis not present

## 2019-10-31 DIAGNOSIS — E119 Type 2 diabetes mellitus without complications: Secondary | ICD-10-CM | POA: Diagnosis not present

## 2019-10-31 DIAGNOSIS — F0390 Unspecified dementia without behavioral disturbance: Secondary | ICD-10-CM | POA: Diagnosis not present

## 2019-10-31 DIAGNOSIS — Z5111 Encounter for antineoplastic chemotherapy: Secondary | ICD-10-CM | POA: Diagnosis not present

## 2019-10-31 LAB — CBC WITH DIFFERENTIAL/PLATELET
Abs Immature Granulocytes: 0.02 10*3/uL (ref 0.00–0.07)
Basophils Absolute: 0 10*3/uL (ref 0.0–0.1)
Basophils Relative: 1 %
Eosinophils Absolute: 0.1 10*3/uL (ref 0.0–0.5)
Eosinophils Relative: 2 %
HCT: 28.6 % — ABNORMAL LOW (ref 36.0–46.0)
Hemoglobin: 8.8 g/dL — ABNORMAL LOW (ref 12.0–15.0)
Immature Granulocytes: 0 %
Lymphocytes Relative: 23 %
Lymphs Abs: 1.4 10*3/uL (ref 0.7–4.0)
MCH: 36.1 pg — ABNORMAL HIGH (ref 26.0–34.0)
MCHC: 30.8 g/dL (ref 30.0–36.0)
MCV: 117.2 fL — ABNORMAL HIGH (ref 80.0–100.0)
Monocytes Absolute: 0.6 10*3/uL (ref 0.1–1.0)
Monocytes Relative: 10 %
Neutro Abs: 3.8 10*3/uL (ref 1.7–7.7)
Neutrophils Relative %: 64 %
Platelets: 326 10*3/uL (ref 150–400)
RBC: 2.44 MIL/uL — ABNORMAL LOW (ref 3.87–5.11)
RDW: 15.4 % (ref 11.5–15.5)
WBC: 6 10*3/uL (ref 4.0–10.5)
nRBC: 0 % (ref 0.0–0.2)

## 2019-10-31 LAB — COMPREHENSIVE METABOLIC PANEL
ALT: 12 U/L (ref 0–44)
AST: 20 U/L (ref 15–41)
Albumin: 3.9 g/dL (ref 3.5–5.0)
Alkaline Phosphatase: 91 U/L (ref 38–126)
Anion gap: 14 (ref 5–15)
BUN: 45 mg/dL — ABNORMAL HIGH (ref 8–23)
CO2: 29 mmol/L (ref 22–32)
Calcium: 9 mg/dL (ref 8.9–10.3)
Chloride: 94 mmol/L — ABNORMAL LOW (ref 98–111)
Creatinine, Ser: 6.28 mg/dL — ABNORMAL HIGH (ref 0.44–1.00)
GFR calc Af Amer: 7 mL/min — ABNORMAL LOW (ref >60)
GFR calc non Af Amer: 6 mL/min — ABNORMAL LOW (ref >60)
Glucose, Bld: 188 mg/dL — ABNORMAL HIGH (ref 70–99)
Potassium: 3.9 mmol/L (ref 3.5–5.1)
Sodium: 137 mmol/L (ref 135–145)
Total Bilirubin: 0.4 mg/dL (ref 0.3–1.2)
Total Protein: 6.9 g/dL (ref 6.5–8.1)

## 2019-10-31 MED ORDER — PROCHLORPERAZINE MALEATE 10 MG PO TABS
ORAL_TABLET | ORAL | Status: AC
  Filled 2019-10-31: qty 1

## 2019-10-31 MED ORDER — PROCHLORPERAZINE MALEATE 10 MG PO TABS
10.0000 mg | ORAL_TABLET | Freq: Once | ORAL | Status: AC
  Administered 2019-10-31: 10 mg via ORAL

## 2019-10-31 MED ORDER — BORTEZOMIB CHEMO SQ INJECTION 3.5 MG (2.5MG/ML)
1.3000 mg/m2 | Freq: Once | INTRAMUSCULAR | Status: AC
  Administered 2019-10-31: 2.25 mg via SUBCUTANEOUS
  Filled 2019-10-31: qty 0.9

## 2019-10-31 MED ORDER — DEXAMETHASONE 4 MG PO TABS
ORAL_TABLET | ORAL | Status: AC
  Filled 2019-10-31: qty 3

## 2019-10-31 MED ORDER — DEXAMETHASONE 4 MG PO TABS
10.0000 mg | ORAL_TABLET | Freq: Once | ORAL | Status: AC
  Administered 2019-10-31: 10 mg via ORAL

## 2019-10-31 NOTE — Progress Notes (Signed)
Patient presents today for Velcade injection. Vital signs are stable. Labs reviewed by [facility] NP. Creatinine 6.28. Patient is a dialysis patient. All other labs within parameters for treatment. Verbal order received by RLockamy NP to proceed with her Velcade today.   Treatment given today per MD orders. Tolerated infusion without adverse affects. Vital signs stable. No complaints at this time. Discharged from clinic via wheel chair. F/U with [facility] as scheduled.

## 2019-10-31 NOTE — Patient Instructions (Signed)
[facility] Discharge Instructions for Patients Receiving Chemotherapy  Today you received the following chemotherapy agents   To help prevent nausea and vomiting after your treatment, we encourage you to take your nausea medication   If you develop nausea and vomiting that is not controlled by your nausea medication, call the clinic.   BELOW ARE SYMPTOMS THAT SHOULD BE REPORTED IMMEDIATELY:  *FEVER GREATER THAN 100.5 F  *CHILLS WITH OR WITHOUT FEVER  NAUSEA AND VOMITING THAT IS NOT CONTROLLED WITH YOUR NAUSEA MEDICATION  *UNUSUAL SHORTNESS OF BREATH  *UNUSUAL BRUISING OR BLEEDING  TENDERNESS IN MOUTH AND THROAT WITH OR WITHOUT PRESENCE OF ULCERS  *URINARY PROBLEMS  *BOWEL PROBLEMS  UNUSUAL RASH Items with * indicate a potential emergency and should be followed up as soon as possible.  Feel free to call the clinic should you have any questions or concerns. The clinic phone number is [phone].  Please show the CHEMO ALERT CARD at check-in to the Emergency Department and triage nurse.   

## 2019-11-01 DIAGNOSIS — N186 End stage renal disease: Secondary | ICD-10-CM | POA: Diagnosis not present

## 2019-11-01 DIAGNOSIS — Z992 Dependence on renal dialysis: Secondary | ICD-10-CM | POA: Diagnosis not present

## 2019-11-02 ENCOUNTER — Encounter: Payer: Self-pay | Admitting: Adult Health

## 2019-11-02 ENCOUNTER — Non-Acute Institutional Stay (SKILLED_NURSING_FACILITY): Payer: Medicare PPO | Admitting: Adult Health

## 2019-11-02 DIAGNOSIS — C9000 Multiple myeloma not having achieved remission: Secondary | ICD-10-CM | POA: Diagnosis not present

## 2019-11-02 DIAGNOSIS — C50911 Malignant neoplasm of unspecified site of right female breast: Secondary | ICD-10-CM

## 2019-11-02 DIAGNOSIS — D631 Anemia in chronic kidney disease: Secondary | ICD-10-CM | POA: Diagnosis not present

## 2019-11-02 DIAGNOSIS — R488 Other symbolic dysfunctions: Secondary | ICD-10-CM | POA: Diagnosis not present

## 2019-11-02 DIAGNOSIS — N186 End stage renal disease: Secondary | ICD-10-CM | POA: Diagnosis not present

## 2019-11-02 DIAGNOSIS — F0390 Unspecified dementia without behavioral disturbance: Secondary | ICD-10-CM | POA: Diagnosis not present

## 2019-11-02 DIAGNOSIS — E119 Type 2 diabetes mellitus without complications: Secondary | ICD-10-CM | POA: Diagnosis not present

## 2019-11-02 MED ORDER — FULVESTRANT 250 MG/5ML IM SOLN
INTRAMUSCULAR | Status: AC
  Filled 2019-11-02: qty 10

## 2019-11-02 MED ORDER — DENOSUMAB 120 MG/1.7ML ~~LOC~~ SOLN
SUBCUTANEOUS | Status: AC
  Filled 2019-11-02: qty 1.7

## 2019-11-02 MED ORDER — DENOSUMAB 60 MG/ML ~~LOC~~ SOSY
PREFILLED_SYRINGE | SUBCUTANEOUS | Status: AC
  Filled 2019-11-02: qty 2

## 2019-11-02 MED ORDER — OCTREOTIDE ACETATE 30 MG IM KIT
PACK | INTRAMUSCULAR | Status: AC
  Filled 2019-11-02: qty 1

## 2019-11-02 NOTE — Progress Notes (Signed)
Location:    [facility] Room Number: 148/D Place of Service:  SNF (31)   CODE STATUS: Full Code  Allergies  Allergen Reactions  . Penicillins Other (See Comments)    Unsure of reaction Has patient had a PCN reaction causing immediate rash, facial/tongue/throat swelling, SOB or lightheadedness with hypotension: Unknown Has patient had a PCN reaction causing severe rash involving mucus membranes or skin necrosis: Unknown Has patient had a PCN reaction that required hospitalization: No Has patient had a PCN reaction occurring within the last 10 years: Unknown If all of the above answers are "NO", then may proceed with Cephalosporin use.    . Ace Inhibitors Cough and Other (See Comments)    Tongue swell  . Lisinopril Other (See Comments)    Tongue swell    Chief Complaint  Patient presents with  . Short Term Rehab (STR)           Stage 1 infiltrating ductal carcinoma of right female breast    Multiple myeloma not achieving remission:   Anemia due to end stage renal disease    Weekly follow up for the first 30 days post admission.     HPI:  She is a 84 year old long term resident of this facility being seen for the management of her chronic illnesses: Stage 1 infiltrating ductal carcinoma of right female breast    Multiple myeloma not achieving remission:   Anemia due to end stage renal disease    There are no reports of uncontrolled pain; no changes in appetite; no reports of insomnia.   Past Medical History:  Diagnosis Date  . Anemia   . Anxiety   . Chronic kidney disease   . Chronic renal disease, stage 4, severely decreased glomerular filtration rate (GFR) between 15-29 mL/min/1.73 square meter (HCC) 08/22/2015  . Complication of anesthesia    delirious after Breast Surgery  . Dementia ([place])    mild  . Depression   . Diabetes mellitus without complication ([place])    type II  . Dyspnea    with activity  . GERD (gastroesophageal reflux disease)   .  Glaucoma   . Hypertension   . Pneumonia   . Stage 1 infiltrating ductal carcinoma of right female breast ([place]) 08/21/2015   ER+ PR+ HER 2 neu + (3+) T1cN0     Past Surgical History:  Procedure Laterality Date  . AV FISTULA PLACEMENT Left 11/22/2017   Procedure: ARTERIOVENOUS (AV) FISTULA CREATION LEFT ARM;  Surgeon: Elam Dutch, MD;  Location: [facility];  Service: Vascular;  Laterality: Left;  . BIOPSY  08/07/2016   Procedure: BIOPSY;  Surgeon: Daneil Dolin, MD;  [facility];  Service: Endoscopy;;  gastric ulcer biopsy  . COLONOSCOPY    . ESOPHAGOGASTRODUODENOSCOPY N/A 08/07/2016   Procedure: ESOPHAGOGASTRODUODENOSCOPY (EGD);  Surgeon: Daneil Dolin, MD;  [facility];  Service: Endoscopy;  Laterality: N/A;  1215-rescheduled to 6/1 @ 2:30pm per Tretha Sciara  . ESOPHAGOGASTRODUODENOSCOPY N/A 11/27/2016   Procedure: ESOPHAGOGASTRODUODENOSCOPY (EGD);  Surgeon: Daneil Dolin, MD;  [facility];  Service: Endoscopy;  Laterality: N/A;  8:15am  . FISTULA SUPERFICIALIZATION Left 02/14/2018   Procedure: FISTULA SUPERFICIALIZATION LEFT ARM;  Surgeon: Angelia Mould, MD;  Location: [facility];  Service: Vascular;  Laterality: Left;  . FRACTURE SURGERY Right    ankle  . MALONEY DILATION N/A 08/07/2016   Procedure: Venia Minks DILATION;  Surgeon: Daneil Dolin, MD;  [facility];  Service: Endoscopy;  Laterality: N/A;  . MASTECTOMY, PARTIAL Right   . PERIPHERAL VASCULAR BALLOON ANGIOPLASTY Left 07/13/2019   Procedure: PERIPHERAL VASCULAR BALLOON ANGIOPLASTY;  Surgeon: Marty Heck, MD;  Location: [facility];  Service: Cardiovascular;  Laterality: Left;  arm fistulogram  . PORT-A-CATH REMOVAL Left 11/22/2017   Procedure: REMOVAL PORT-A-CATH LEFT CHEST;  Surgeon: Elam Dutch, MD;  Location: [facility];  Service: Vascular;  Laterality: Left;  . RETINAL DETACHMENT SURGERY Right     Social History   Socioeconomic History  . Marital status: Single     Spouse name: Not on file  . Number of children: Not on file  . Years of education: Not on file  . Highest education level: Not on file  Occupational History  . Not on file  Tobacco Use  . Smoking status: Never Smoker  . Smokeless tobacco: Never Used  Vaping Use  . Vaping Use: Never used  Substance and Sexual Activity  . Alcohol use: No    Alcohol/week: 0.0 standard drinks  . Drug use: No  . Sexual activity: Not on file  Other Topics Concern  . Not on file  Social History Narrative  . Not on file   Social Determinants of Health   Financial Resource Strain:   . Difficulty of Paying Living Expenses: Not on file  Food Insecurity:   . Worried About Charity fundraiser in the Last Year: Not on file  . Ran Out of Food in the Last Year: Not on file  Transportation Needs:   . Lack of Transportation (Medical): Not on file  . Lack of Transportation (Non-Medical): Not on file  Physical Activity:   . Days of Exercise per Week: Not on file  . Minutes of Exercise per Session: Not on file  Stress:   . Feeling of Stress : Not on file  Social Connections:   . Frequency of Communication with Friends and Family: Not on file  . Frequency of Social Gatherings with Friends and Family: Not on file  . Attends Religious Services: Not on file  . Active Member of Clubs or Organizations: Not on file  . Attends Archivist Meetings: Not on file  . Marital Status: Not on file  Intimate Partner Violence:   . Fear of Current or Ex-Partner: Not on file  . Emotionally Abused: Not on file  . Physically Abused: Not on file  . Sexually Abused: Not on file   Family History  Problem Relation Age of Onset  . Multiple myeloma Sister   . Brain cancer Sister   . Dementia Mother        died at 39  . Stroke Mother   . Heart failure Mother   . Diabetes Mother   . Heart disease Father   . Prostate cancer Brother   . Colon cancer Neg Hx       VITAL SIGNS BP 135/75   Pulse 96   Temp 98  F (36.7 C) (Oral)   Resp 20   Ht _0  (1.6 m)   Wt 130 lb 9.6 oz (59.2 kg)   SpO2 98%   BMI 23.13 kg/m   Outpatient Encounter Medications as of 11/02/2019  Medication Sig  . acetaminophen (TYLENOL) 500 MG tablet Take 500 mg by mouth every 6 (six) hours as needed for moderate pain or headache.   Marland Kitchen acyclovir (ZOVIRAX) 200 MG capsule TAKE 1 CAPSULE BY MOUTH TWICE DAILY  . amLODipine (NORVASC) 10  MG tablet Take 10 mg by mouth daily. Not taking on dialysis days  . anastrozole (ARIMIDEX) 1 MG tablet TAKE 1 TABLET BY MOUTH DAILY  . aspirin EC 81 MG tablet Take 81 mg by mouth daily.  . Calcium Carbonate-Vitamin D (CALCIUM 600+D PO) Take 1 tablet by mouth daily.  . cloNIDine (CATAPRES) 0.3 MG tablet Take 0.3 mg by mouth 2 (two) times daily.   . Glucerna (GLUCERNA) LIQD Take 237 mLs by mouth daily.  . melatonin 3 MG TABS tablet Take 6 mg by mouth at bedtime. For Sleep  . Multiple Vitamin (MULTIVITAMIN) capsule Take 1 capsule by mouth daily.  . NON FORMULARY Diet: _____ Regular, ___x___ NAS, _______Consistent Carbohydrate, _______NPO _____Other  . NON FORMULARY Wanderguard #3310 to ankle for safety awareness. Check placement and function qshift. Every Shift Day, Evening, Night   No facility-administered encounter medications on file as of 11/02/2019.     SIGNIFICANT DIAGNOSTIC EXAMS   LABS REVIEWED PREVIOUS  10-13-19: hgb a1c 6.0; vit B 12: 1126 folate 20.9 10-17-19: wbc 8.8; hgb 8.3; hct 26.1; mcv 114.0 plt 270; glucose 190; bun 28; creat 4.85; k+ 3.9; na++ 127; ca 8.9 liver normal albumin 4.0   NO NEW LABS.   Review of Systems  Constitutional: Negative for malaise/fatigue.  Respiratory: Negative for cough and shortness of breath.   Cardiovascular: Negative for chest pain, palpitations and leg swelling.  Gastrointestinal: Negative for abdominal pain, constipation and heartburn.  Musculoskeletal: Negative for back pain, joint pain and myalgias.  Skin: Negative.   Neurological:  Negative for dizziness.  Psychiatric/Behavioral: The patient is not nervous/anxious.    Physical Exam Constitutional:      General: She is not in acute distress.    Appearance: She is well-developed. She is not diaphoretic.  Neck:     Thyroid: No thyromegaly.  Cardiovascular:     Rate and Rhythm: Normal rate and regular rhythm.     Heart sounds: Normal heart sounds.     Comments: Pedal pulse faint  Pulmonary:     Effort: Pulmonary effort is normal. No respiratory distress.     Breath sounds: Normal breath sounds.  Abdominal:     General: Bowel sounds are normal. There is no distension.     Palpations: Abdomen is soft.     Tenderness: There is no abdominal tenderness.  Musculoskeletal:        General: Normal range of motion.     Cervical back: Neck supple.     Right lower leg: No edema.     Left lower leg: No edema.  Lymphadenopathy:     Cervical: No cervical adenopathy.  Skin:    General: Skin is warm and dry.     Comments: Left arm a/v fistula: + thrill + bruit      Neurological:     Mental Status: She is alert. Mental status is at baseline.  Psychiatric:        Mood and Affect: Mood normal.      ASSESSMENT/ PLAN:  TODAY  1. Stage 1 infiltrating ductal carcinoma of right female breast is stable is followed by oncology will continue armidex 1 mg daily   2. Multiple myeloma not achieving remission: is without change is followed by oncology  3. Anemia due to end stage renal disease is stable hgb 8.3 will monitor   PREVIOUS   4. Herpes: no reports of outbreaks will continue acyclovir 200 mg twice daily   5.  Vascular dementia with behavioral disturbance: is stable weight  is 130 pounds will moinotr   6. Diabetes with end stage renal disease (ESRD) is stable hgb a1c 6.0 will monitor  7. Chronic kidney disease with end stage renal disease on dialysis due to type 2 diabetes mellitus / dependence on dialysis is stable will continue dialysis three days per week  8.  Hypertension due to end stage renal disease due to type 2 diabetes mellitus is stable b/p 135/75 will continue norvasc 10 mg four times weekly and clonidine 0.3 mg twice daily asa 81 mg daily     MD is aware of resident's narcotic use and is in agreement with current plan of care. We will attempt to wean resident as appropriate.  Ok Edwards NP [facility]  Contact [phone] Monday through Friday 8am- 5pm  After hours call [phone]

## 2019-11-03 DIAGNOSIS — N186 End stage renal disease: Secondary | ICD-10-CM | POA: Diagnosis not present

## 2019-11-03 DIAGNOSIS — M79674 Pain in right toe(s): Secondary | ICD-10-CM | POA: Diagnosis not present

## 2019-11-03 DIAGNOSIS — M79675 Pain in left toe(s): Secondary | ICD-10-CM | POA: Diagnosis not present

## 2019-11-03 DIAGNOSIS — L603 Nail dystrophy: Secondary | ICD-10-CM | POA: Diagnosis not present

## 2019-11-03 DIAGNOSIS — R262 Difficulty in walking, not elsewhere classified: Secondary | ICD-10-CM | POA: Diagnosis not present

## 2019-11-03 DIAGNOSIS — Z794 Long term (current) use of insulin: Secondary | ICD-10-CM | POA: Diagnosis not present

## 2019-11-03 DIAGNOSIS — E1151 Type 2 diabetes mellitus with diabetic peripheral angiopathy without gangrene: Secondary | ICD-10-CM | POA: Diagnosis not present

## 2019-11-03 DIAGNOSIS — M2041 Other hammer toe(s) (acquired), right foot: Secondary | ICD-10-CM | POA: Diagnosis not present

## 2019-11-03 DIAGNOSIS — B351 Tinea unguium: Secondary | ICD-10-CM | POA: Diagnosis not present

## 2019-11-03 DIAGNOSIS — Z992 Dependence on renal dialysis: Secondary | ICD-10-CM | POA: Diagnosis not present

## 2019-11-06 DIAGNOSIS — Z1159 Encounter for screening for other viral diseases: Secondary | ICD-10-CM | POA: Diagnosis not present

## 2019-11-06 DIAGNOSIS — N186 End stage renal disease: Secondary | ICD-10-CM | POA: Diagnosis not present

## 2019-11-06 DIAGNOSIS — F0390 Unspecified dementia without behavioral disturbance: Secondary | ICD-10-CM | POA: Diagnosis not present

## 2019-11-06 DIAGNOSIS — C9000 Multiple myeloma not having achieved remission: Secondary | ICD-10-CM | POA: Diagnosis not present

## 2019-11-06 DIAGNOSIS — R488 Other symbolic dysfunctions: Secondary | ICD-10-CM | POA: Diagnosis not present

## 2019-11-06 DIAGNOSIS — E119 Type 2 diabetes mellitus without complications: Secondary | ICD-10-CM | POA: Diagnosis not present

## 2019-11-06 DIAGNOSIS — Z992 Dependence on renal dialysis: Secondary | ICD-10-CM | POA: Diagnosis not present

## 2019-11-07 ENCOUNTER — Inpatient Hospital Stay (HOSPITAL_COMMUNITY): Payer: Medicare PPO

## 2019-11-07 ENCOUNTER — Encounter (HOSPITAL_COMMUNITY): Payer: Self-pay

## 2019-11-07 ENCOUNTER — Inpatient Hospital Stay (HOSPITAL_COMMUNITY): Payer: Medicare PPO | Attending: Hematology

## 2019-11-07 VITALS — BP 99/47 | HR 64 | Temp 96.8°F | Resp 18 | Wt 129.5 lb

## 2019-11-07 DIAGNOSIS — Z992 Dependence on renal dialysis: Secondary | ICD-10-CM | POA: Diagnosis not present

## 2019-11-07 DIAGNOSIS — N186 End stage renal disease: Secondary | ICD-10-CM | POA: Diagnosis not present

## 2019-11-07 DIAGNOSIS — Z5111 Encounter for antineoplastic chemotherapy: Secondary | ICD-10-CM | POA: Diagnosis not present

## 2019-11-07 DIAGNOSIS — C9000 Multiple myeloma not having achieved remission: Secondary | ICD-10-CM | POA: Insufficient documentation

## 2019-11-07 LAB — COMPREHENSIVE METABOLIC PANEL
ALT: 11 U/L (ref 0–44)
AST: 19 U/L (ref 15–41)
Albumin: 3.8 g/dL (ref 3.5–5.0)
Alkaline Phosphatase: 88 U/L (ref 38–126)
Anion gap: 14 (ref 5–15)
BUN: 44 mg/dL — ABNORMAL HIGH (ref 8–23)
CO2: 27 mmol/L (ref 22–32)
Calcium: 9.1 mg/dL (ref 8.9–10.3)
Chloride: 95 mmol/L — ABNORMAL LOW (ref 98–111)
Creatinine, Ser: 5.93 mg/dL — ABNORMAL HIGH (ref 0.44–1.00)
GFR calc Af Amer: 7 mL/min — ABNORMAL LOW (ref >60)
GFR calc non Af Amer: 6 mL/min — ABNORMAL LOW (ref >60)
Glucose, Bld: 201 mg/dL — ABNORMAL HIGH (ref 70–99)
Potassium: 4.5 mmol/L (ref 3.5–5.1)
Sodium: 136 mmol/L (ref 135–145)
Total Bilirubin: 0.4 mg/dL (ref 0.3–1.2)
Total Protein: 6.8 g/dL (ref 6.5–8.1)

## 2019-11-07 LAB — CBC WITH DIFFERENTIAL/PLATELET
Abs Immature Granulocytes: 0.03 10*3/uL (ref 0.00–0.07)
Basophils Absolute: 0.1 10*3/uL (ref 0.0–0.1)
Basophils Relative: 1 %
Eosinophils Absolute: 0.2 10*3/uL (ref 0.0–0.5)
Eosinophils Relative: 3 %
HCT: 29.4 % — ABNORMAL LOW (ref 36.0–46.0)
Hemoglobin: 9.2 g/dL — ABNORMAL LOW (ref 12.0–15.0)
Immature Granulocytes: 1 %
Lymphocytes Relative: 23 %
Lymphs Abs: 1.4 10*3/uL (ref 0.7–4.0)
MCH: 36.2 pg — ABNORMAL HIGH (ref 26.0–34.0)
MCHC: 31.3 g/dL (ref 30.0–36.0)
MCV: 115.7 fL — ABNORMAL HIGH (ref 80.0–100.0)
Monocytes Absolute: 0.6 10*3/uL (ref 0.1–1.0)
Monocytes Relative: 9 %
Neutro Abs: 3.9 10*3/uL (ref 1.7–7.7)
Neutrophils Relative %: 63 %
Platelets: 276 10*3/uL (ref 150–400)
RBC: 2.54 MIL/uL — ABNORMAL LOW (ref 3.87–5.11)
RDW: 14.7 % (ref 11.5–15.5)
WBC: 6.1 10*3/uL (ref 4.0–10.5)
nRBC: 0 % (ref 0.0–0.2)

## 2019-11-07 MED ORDER — BORTEZOMIB CHEMO SQ INJECTION 3.5 MG (2.5MG/ML)
1.3000 mg/m2 | Freq: Once | INTRAMUSCULAR | Status: AC
  Administered 2019-11-07: 2.25 mg via SUBCUTANEOUS
  Filled 2019-11-07: qty 0.9

## 2019-11-07 MED ORDER — PROCHLORPERAZINE MALEATE 10 MG PO TABS
10.0000 mg | ORAL_TABLET | Freq: Once | ORAL | Status: AC
  Administered 2019-11-07: 10 mg via ORAL
  Filled 2019-11-07: qty 1

## 2019-11-07 MED ORDER — DEXAMETHASONE 4 MG PO TABS
10.0000 mg | ORAL_TABLET | Freq: Once | ORAL | Status: AC
  Administered 2019-11-07: 10 mg via ORAL
  Filled 2019-11-07: qty 3

## 2019-11-07 NOTE — Progress Notes (Signed)
[name] reviewed with RLockamy NP and pt approved for Velcade injection today per NP                                                        Carly Wood tolerated Velcade injection well without complaints or incident. VSS Pt discharged via wheelchair in satisfactory condition

## 2019-11-07 NOTE — Patient Instructions (Signed)
[facility] Discharge Instructions for Patients Receiving Chemotherapy   Beginning January 23rd 2017 lab work for the Cancer Center will be done in the  Main lab at [facility] on 1st floor. If you have a lab appointment with the Cancer Center please come in thru the  Main Entrance and check in at the main information desk   Today you received the following chemotherapy agents Velcade injection. Follow-up as scheduled  To help prevent nausea and vomiting after your treatment, we encourage you to take your nausea medication   If you develop nausea and vomiting, or diarrhea that is not controlled by your medication, call the clinic.  The clinic phone number is [phone]. Office hours are Monday-Friday 8:30am-5:00pm.  BELOW ARE SYMPTOMS THAT SHOULD BE REPORTED IMMEDIATELY:  *FEVER GREATER THAN 101.0 F  *CHILLS WITH OR WITHOUT FEVER  NAUSEA AND VOMITING THAT IS NOT CONTROLLED WITH YOUR NAUSEA MEDICATION  *UNUSUAL SHORTNESS OF BREATH  *UNUSUAL BRUISING OR BLEEDING  TENDERNESS IN MOUTH AND THROAT WITH OR WITHOUT PRESENCE OF ULCERS  *URINARY PROBLEMS  *BOWEL PROBLEMS  UNUSUAL RASH Items with * indicate a potential emergency and should be followed up as soon as possible. If you have an emergency after office hours please contact your primary care physician or go to the nearest emergency department.  Please call the clinic during office hours if you have any questions or concerns.   You may also contact the Patient Navigator at [phone] should you have any questions or need assistance in obtaining follow up care.      Resources For Cancer Patients and their Caregivers ? American Cancer Society: Can assist with transportation, wigs, general needs, runs Look Good Feel Better.        1-888-227-6333 ? Cancer Care: Provides financial assistance, online support groups, medication/co-pay assistance.  1-800-813-HOPE (4673) ? [facility] Assists [place] cancer patients and their families through emotional , educational and financial support.  336-427-4357 ? [place] DSS Where to apply for food stamps, Medicaid and utility assistance. 336-342-1394 ? [facility]: Transportation to medical appointments. 336-347-2287 ? Social Security Administration: May apply for disability if have a Stage IV cancer. 336-342-7796 1-800-772-1213 ? [place] [facility]ging, Disability and Transit Services: Assists with nutrition, care and transit needs. 336-349-2343         

## 2019-11-08 ENCOUNTER — Encounter: Payer: Self-pay | Admitting: Adult Health

## 2019-11-08 ENCOUNTER — Non-Acute Institutional Stay (SKILLED_NURSING_FACILITY): Payer: Medicare PPO | Admitting: Adult Health

## 2019-11-08 DIAGNOSIS — Z992 Dependence on renal dialysis: Secondary | ICD-10-CM | POA: Diagnosis not present

## 2019-11-08 DIAGNOSIS — Z0000 Encounter for general adult medical examination without abnormal findings: Secondary | ICD-10-CM | POA: Diagnosis not present

## 2019-11-08 DIAGNOSIS — E119 Type 2 diabetes mellitus without complications: Secondary | ICD-10-CM | POA: Diagnosis not present

## 2019-11-08 DIAGNOSIS — N186 End stage renal disease: Secondary | ICD-10-CM | POA: Diagnosis not present

## 2019-11-08 DIAGNOSIS — R488 Other symbolic dysfunctions: Secondary | ICD-10-CM | POA: Diagnosis not present

## 2019-11-08 DIAGNOSIS — C9000 Multiple myeloma not having achieved remission: Secondary | ICD-10-CM | POA: Diagnosis not present

## 2019-11-08 DIAGNOSIS — F0390 Unspecified dementia without behavioral disturbance: Secondary | ICD-10-CM | POA: Diagnosis not present

## 2019-11-08 LAB — PROTEIN ELECTROPHORESIS, SERUM
A/G Ratio: 1.4 (ref 0.7–1.7)
Albumin ELP: 3.6 g/dL (ref 2.9–4.4)
Alpha-1-Globulin: 0.2 g/dL (ref 0.0–0.4)
Alpha-2-Globulin: 0.8 g/dL (ref 0.4–1.0)
Beta Globulin: 0.9 g/dL (ref 0.7–1.3)
Gamma Globulin: 0.7 g/dL (ref 0.4–1.8)
Globulin, Total: 2.6 g/dL (ref 2.2–3.9)
Total Protein ELP: 6.2 g/dL (ref 6.0–8.5)

## 2019-11-08 LAB — KAPPA/LAMBDA LIGHT CHAINS
Kappa free light chain: 146.9 mg/L — ABNORMAL HIGH (ref 3.3–19.4)
Kappa, lambda light chain ratio: 1.14 (ref 0.26–1.65)
Lambda free light chains: 129.1 mg/L — ABNORMAL HIGH (ref 5.7–26.3)

## 2019-11-08 MED ORDER — OCTREOTIDE ACETATE 30 MG IM KIT
PACK | INTRAMUSCULAR | Status: AC
  Filled 2019-11-08: qty 1

## 2019-11-08 NOTE — Progress Notes (Signed)
Subjective:   Carly Wood is a 84 y.o. female who presents for Medicare Annual (Subsequent) preventive examination. Long term resident of SNF      Review of Systems  Constitutional: Negative for malaise/fatigue.  Respiratory: Negative for cough and shortness of breath.   Cardiovascular: Negative for chest pain, palpitations and leg swelling.  Gastrointestinal: Negative for abdominal pain, constipation and heartburn.  Musculoskeletal: Negative for back pain, joint pain and myalgias.  Skin: Negative.   Neurological: Negative for dizziness.  Psychiatric/Behavioral: The patient is not nervous/anxious.     Cardiac Risk Factors include: advanced age (>22mn, >>57women);diabetes mellitus;hypertension;sedentary lifestyle     Objective:    Today's Vitals   11/08/19 1025 11/08/19 1058  BP: 137/77   Pulse: 62   Resp: 20   Temp: 98 F (36.7 C)   TempSrc: Oral   SpO2: 98%   Weight: 133 lb 9.6 oz (60.6 kg)   Height: _0  (1.6 m)   PainSc:  0-No pain   Body mass index is 23.67 kg/m.  Advanced Directives 11/08/2019 11/07/2019 11/02/2019 10/31/2019 10/26/2019 10/25/2019 10/19/2019  Does Patient Have a Medical Advance Directive? _1  Yes Yes  Type of Advance Directive (No Data) [place] will (No Data) - (No Data) (No Data) (No Data)  Does patient want to make changes to medical advance directive? No - Patient declined No - Patient declined No - Patient declined No - Patient declined No - Patient declined No - Patient declined No - Patient declined  Copy of [place] Chart? - No - copy requested - No - copy requested - - -  Would patient like information on creating a medical advance directive? - No - Patient declined - No - Patient declined - - -    Current Medications (verified) Outpatient Encounter Medications as of 11/08/2019  Medication Sig   acetaminophen (TYLENOL) 500 MG tablet Take 500 mg by mouth every 6 (six) hours  as needed for moderate pain or headache.    acyclovir (ZOVIRAX) 200 MG capsule Take 200 mg by mouth daily.   amLODipine (NORVASC) 10 MG tablet Take 10 mg by mouth daily. Not taking on dialysis days   anastrozole (ARIMIDEX) 1 MG tablet TAKE 1 TABLET BY MOUTH DAILY   aspirin EC 81 MG tablet Take 81 mg by mouth daily.   Calcium Carbonate-Vitamin D (CALCIUM 600+D PO) Take 1 tablet by mouth daily.   cloNIDine (CATAPRES) 0.3 MG tablet Take 0.3 mg by mouth 2 (two) times daily.    Glucerna (GLUCERNA) LIQD Take 237 mLs by mouth daily.   melatonin 3 MG TABS tablet Take 6 mg by mouth at bedtime. For Sleep   Multiple Vitamin (MULTIVITAMIN) capsule Take 1 capsule by mouth daily.   NON FORMULARY Diet: _____ Regular, ___x___ NAS, _______Consistent Carbohydrate, _______NPO _____Other   NAugusto Gamble#[id number] to ankle for safety awareness. Check placement and function qshift. Every Shift Day, Evening, Night   [DISCONTINUED] acyclovir (ZOVIRAX) 200 MG capsule TAKE 1 CAPSULE BY MOUTH TWICE DAILY   No facility-administered encounter medications on file as of 11/08/2019.    Allergies (verified) Penicillins, Ace inhibitors, and Lisinopril   History: Past Medical History:  Diagnosis Date   Anemia    Anxiety    Chronic kidney disease    Chronic renal disease, stage 4, severely decreased glomerular filtration rate (GFR) between 15-29 mL/min/1.73 square meter ([place] 65/46/5035  Complication of anesthesia    delirious  after Breast Surgery   Dementia (HCC)    mild   Depression    Diabetes mellitus without complication ([place])    type II   Dyspnea    with activity   GERD (gastroesophageal reflux disease)    Glaucoma    Hypertension    Pneumonia    Stage 1 infiltrating ductal carcinoma of right female breast ([place]) 08/21/2015   ER+ PR+ HER 2 neu + (3+) T1cN0    Past Surgical History:  Procedure Laterality Date   AV FISTULA PLACEMENT Left 11/22/2017   Procedure:  ARTERIOVENOUS (AV) FISTULA CREATION LEFT ARM;  Surgeon: Elam Dutch, MD;  [facility] OR;  Service: Vascular;  Laterality: Left;   BIOPSY  08/07/2016   Procedure: BIOPSY;  Surgeon: Daneil Dolin, MD;  Location: [facility];  Service: Endoscopy;;  gastric ulcer biopsy   COLONOSCOPY     ESOPHAGOGASTRODUODENOSCOPY N/A 08/07/2016   Procedure: ESOPHAGOGASTRODUODENOSCOPY (EGD);  Surgeon: Daneil Dolin, MD;  Location: [facility];  Service: Endoscopy;  Laterality: N/A;  1215-rescheduled to 6/1 @ 2:30pm per Tretha Sciara   ESOPHAGOGASTRODUODENOSCOPY N/A 11/27/2016   Procedure: ESOPHAGOGASTRODUODENOSCOPY (EGD);  Surgeon: Daneil Dolin, MD;  [facility];  Service: Endoscopy;  Laterality: N/A;  8:15am   FISTULA SUPERFICIALIZATION Left 02/14/2018   Procedure: FISTULA SUPERFICIALIZATION LEFT ARM;  Surgeon: Angelia Mould, MD;  Location: [place];  Service: Vascular;  Laterality: Left;   FRACTURE SURGERY Right    ankle   MALONEY DILATION N/A 08/07/2016   Procedure: Venia Minks DILATION;  Surgeon: Daneil Dolin, MD;  [facility];  Service: Endoscopy;  Laterality: N/A;   MASTECTOMY, PARTIAL Right    PERIPHERAL VASCULAR BALLOON ANGIOPLASTY Left 07/13/2019   Procedure: PERIPHERAL VASCULAR BALLOON ANGIOPLASTY;  Surgeon: Marty Heck, MD;  Location: [facility];  Service: Cardiovascular;  Laterality: Left;  arm fistulogram   PORT-A-CATH REMOVAL Left 11/22/2017   Procedure: REMOVAL PORT-A-CATH LEFT CHEST;  Surgeon: Elam Dutch, MD;  Location: [facility] OR;  Service: Vascular;  Laterality: Left;   RETINAL DETACHMENT SURGERY Right    Family History  Problem Relation Age of Onset   Multiple myeloma Sister    Brain cancer Sister    Dementia Mother        died at 89   Stroke Mother    Heart failure Mother    Diabetes Mother    Heart disease Father    Prostate cancer Brother    Colon cancer Neg Hx    Social History   Socioeconomic History    Marital status: Single    Spouse name: Not on file   Number of children: Not on file   Years of education: Not on file   Highest education level: Not on file  Occupational History   Occupation: retired   Tobacco Use   Smoking status: Never Smoker   Smokeless tobacco: Never Used  Scientific laboratory technician Use: Never used  Substance and Sexual Activity   Alcohol use: No    Alcohol/week: 0.0 standard drinks   Drug use: No   Sexual activity: Never  Other Topics Concern   Not on file  Social History Narrative   Long term resident of SNF    Social Determinants of Health   Financial Resource Strain:    Difficulty of Paying Living Expenses: Not on file  Food Insecurity:    Worried About Running Out of Food in the Last Year: Not on file  Ran Out of Food in the Last Year: Not on file  Transportation Needs:    Lack of Transportation (Medical): Not on file   Lack of Transportation (Non-Medical): Not on file  Physical Activity:    Days of Exercise per Week: Not on file   Minutes of Exercise per Session: Not on file  Stress:    Feeling of Stress : Not on file  Social Connections:    Frequency of Communication with Friends and Family: Not on file   Frequency of Social Gatherings with Friends and Family: Not on file   Attends Religious Services: Not on file   Active Member of Clubs or Organizations: Not on file   Attends Archivist Meetings: Not on file   Marital Status: Not on file    Tobacco Counseling Counseling given: Not Answered   Clinical Intake:  Pre-visit preparation completed: Yes  Pain : No/denies pain Pain Score: 0-No pain     BMI - recorded: 23.67 Nutritional Status: BMI of 19-24  Normal Diabetes: Yes CBG done?:  (by nursing staff)     Diabetic yes   Interpreter Needed?: No      Activities of Daily Living In your present state of health, do you have any difficulty performing the following activities: 11/08/2019    Hearing? N  Vision? N  Difficulty concentrating or making decisions? N  Walking or climbing stairs? Y  Dressing or bathing? Y  Doing errands, shopping? Y  Preparing Food and eating ? Y  Using the Toilet? Y  In the past six months, have you accidently leaked urine? Y  Do you have problems with loss of bowel control? Y  Managing your Medications? Y  Managing your Finances? Y  Housekeeping or managing your Housekeeping? Y  Some recent data might be hidden    Patient Care Team: Gerlene Fee, NP as PCP - General (Geriatric Medicine) Gala Romney Cristopher Estimable, MD as Consulting Physician (Gastroenterology) Fran Lowes, MD (Inactive) as Consulting Physician (Nephrology) [facility] [place] ([place])  Indicate any recent Medical Services you may have received from other than [facility] providers in the past year (date may be approximate).     Assessment:   This is a routine wellness examination for Jaicey.  Hearing/Vision screen No exam data present  Dietary issues and exercise activities discussed: Current Exercise Habits: The patient does not participate in regular exercise at present  Goals     Follow up with Provider as scheduled     General - Client will not be readmitted within 30 days ([facility])      Depression Screen PHQ 2/9 Scores 11/08/2019  PHQ - 2 Score 0    Fall Risk Fall Risk  11/08/2019  Falls in the past year? 1  Number falls in past yr: 0  Injury with Fall? 0    Any stairs in or around the home? no If so, are there any without handrails? n/a Home free of loose throw rugs in walkways, pet beds, electrical cords, etc? yes  Adequate lighting in your home to reduce risk of falls? yes  ASSISTIVE DEVICES UTILIZED TO PREVENT FALLS:  Life alert? n/a Use of a cane, walker or w/c? Wheelchair  Grab bars in the bathroom? Yes  Shower chair or bench in shower? Yes  Elevated toilet seat or a handicapped toilet? Yes   TIMED UP AND GO:  Was the test  performed? yes Length of time to ambulate 10 feet: 15 sec.     Cognitive  Function:        Immunizations Immunization History  Administered Date(s) Administered   Fluad Quad(high Dose 65+) 11/22/2018   Hepatitis B, adult 05/31/2019, 06/30/2019, 07/31/2019   Influenza,inj,Quad PF,6+ Mos 11/22/2015   Influenza,inj,quad, With Preservative 12/20/2014, 12/31/2016   Influenza-Unspecified 11/22/2018   Moderna SARS-COVID-2 Vaccination 05/11/2019, 06/05/2019   PPD Test 05/03/2019, 06/23/2019, 08/16/2019   Pneumococcal Conjugate-13 12/20/2014   Pneumococcal-Unspecified 12/20/2014    Qualifies for Shingles Vaccine? Yes  Screening Tests Health Maintenance  Topic Date Due   FOOT EXAM  Never done   OPHTHALMOLOGY EXAM  Never done   URINE MICROALBUMIN  Never done   TETANUS/TDAP  Never done   PNA vac Low Risk Adult (2 of 2 - PPSV23) 12/20/2015   INFLUENZA VACCINE  12/07/2019 (Originally 10/08/2019)   HEMOGLOBIN A1C  04/14/2020   DEXA SCAN  Completed   COVID-19 Vaccine  Completed    Health Maintenance  Health Maintenance Due  Topic Date Due   FOOT EXAM  Never done   OPHTHALMOLOGY EXAM  Never done   URINE MICROALBUMIN  Never done   TETANUS/TDAP  Never done   PNA vac Low Risk Adult (2 of 2 - PPSV23) 12/20/2015    Lung Cancer Screening: (Low Dose CT Chest recommended if Age 49-80 years, 30 pack-year currently smoking OR have quit w/in 15years.) does not qualify.   Lung Cancer Screening Referral: n/a  Additional Screening:  Hepatitis C Screening: does  Vision Screening: Recommended annual ophthalmology exams for early detection of glaucoma and other disorders of the eye. Is the patient up to date with their annual eye exam?  yes  Who is the provider or what is the name of the office in which the patient attends annual eye exams?   Dental Screening: Recommended annual dental exams for proper oral hygiene  Community Resource Referral / Chronic Care  Management: CRR required this visit?  no CCM required this visit?  no     Plan:     I have personally reviewed and noted the following in the patients chart:    Medical and social history  Use of alcohol, tobacco or illicit drugs   Current medications and supplements  Functional ability and status  Nutritional status  Physical activity  Advanced directives  List of other physicians  Hospitalizations, surgeries, and ER visits in previous 12 months  Vitals  Screenings to include cognitive, depression, and falls  Referrals and appointments  In addition, I have reviewed and discussed with patient certain preventive protocols, quality metrics, and best practice recommendations. A written personalized care plan for preventive services as well as general preventive health recommendations were provided to patient.     Gerlene Fee, NP   11/08/2019

## 2019-11-08 NOTE — Patient Instructions (Signed)
  Carly Wood , Thank you for taking time to come for your Medicare Wellness Visit. I appreciate your ongoing commitment to your health goals. Please review the following plan we discussed and let me know if I can assist you in the future.   These are the goals we discussed: Goals    . Follow up with Provider as scheduled    . General - Client will not be readmitted within 30 days (C-SNP)       This is a list of the screening recommended for you and due dates:  Health Maintenance  Topic Date Due  . Complete foot exam   Never done  . Eye exam for diabetics  Never done  . Urine Protein Check  Never done  . Tetanus Vaccine  Never done  . Pneumonia vaccines (2 of 2 - PPSV23) 12/20/2015  . Flu Shot  12/07/2019*  . Hemoglobin A1C  04/14/2020  . DEXA scan (bone density measurement)  Completed  . COVID-19 Vaccine  Completed  *Topic was postponed. The date shown is not the original due date.

## 2019-11-09 DIAGNOSIS — C9000 Multiple myeloma not having achieved remission: Secondary | ICD-10-CM | POA: Diagnosis not present

## 2019-11-09 DIAGNOSIS — Z1159 Encounter for screening for other viral diseases: Secondary | ICD-10-CM | POA: Diagnosis not present

## 2019-11-10 DIAGNOSIS — Z992 Dependence on renal dialysis: Secondary | ICD-10-CM | POA: Diagnosis not present

## 2019-11-10 DIAGNOSIS — N186 End stage renal disease: Secondary | ICD-10-CM | POA: Diagnosis not present

## 2019-11-13 DIAGNOSIS — Z992 Dependence on renal dialysis: Secondary | ICD-10-CM | POA: Diagnosis not present

## 2019-11-13 DIAGNOSIS — N186 End stage renal disease: Secondary | ICD-10-CM | POA: Diagnosis not present

## 2019-11-14 ENCOUNTER — Inpatient Hospital Stay (HOSPITAL_COMMUNITY): Payer: Medicare PPO

## 2019-11-14 ENCOUNTER — Ambulatory Visit (HOSPITAL_COMMUNITY): Payer: Medicare PPO

## 2019-11-14 ENCOUNTER — Inpatient Hospital Stay (HOSPITAL_COMMUNITY): Payer: Medicare PPO | Attending: Hematology | Admitting: Hematology

## 2019-11-14 ENCOUNTER — Encounter (HOSPITAL_COMMUNITY): Payer: Self-pay | Admitting: Hematology

## 2019-11-14 ENCOUNTER — Other Ambulatory Visit (HOSPITAL_COMMUNITY): Payer: Medicare PPO

## 2019-11-14 VITALS — BP 102/49 | HR 62 | Temp 96.9°F | Resp 17 | Wt 132.0 lb

## 2019-11-14 DIAGNOSIS — I129 Hypertensive chronic kidney disease with stage 1 through stage 4 chronic kidney disease, or unspecified chronic kidney disease: Secondary | ICD-10-CM | POA: Diagnosis not present

## 2019-11-14 DIAGNOSIS — F0390 Unspecified dementia without behavioral disturbance: Secondary | ICD-10-CM | POA: Insufficient documentation

## 2019-11-14 DIAGNOSIS — C50411 Malignant neoplasm of upper-outer quadrant of right female breast: Secondary | ICD-10-CM | POA: Diagnosis not present

## 2019-11-14 DIAGNOSIS — N184 Chronic kidney disease, stage 4 (severe): Secondary | ICD-10-CM | POA: Diagnosis not present

## 2019-11-14 DIAGNOSIS — C9000 Multiple myeloma not having achieved remission: Secondary | ICD-10-CM | POA: Insufficient documentation

## 2019-11-14 DIAGNOSIS — Z9011 Acquired absence of right breast and nipple: Secondary | ICD-10-CM | POA: Diagnosis not present

## 2019-11-14 DIAGNOSIS — Z79811 Long term (current) use of aromatase inhibitors: Secondary | ICD-10-CM | POA: Diagnosis not present

## 2019-11-14 DIAGNOSIS — E119 Type 2 diabetes mellitus without complications: Secondary | ICD-10-CM | POA: Insufficient documentation

## 2019-11-14 DIAGNOSIS — Z5111 Encounter for antineoplastic chemotherapy: Secondary | ICD-10-CM | POA: Diagnosis not present

## 2019-11-14 DIAGNOSIS — Z992 Dependence on renal dialysis: Secondary | ICD-10-CM | POA: Insufficient documentation

## 2019-11-14 DIAGNOSIS — D630 Anemia in neoplastic disease: Secondary | ICD-10-CM | POA: Diagnosis not present

## 2019-11-14 DIAGNOSIS — K219 Gastro-esophageal reflux disease without esophagitis: Secondary | ICD-10-CM | POA: Diagnosis not present

## 2019-11-14 DIAGNOSIS — Z1159 Encounter for screening for other viral diseases: Secondary | ICD-10-CM | POA: Diagnosis not present

## 2019-11-14 LAB — CBC WITH DIFFERENTIAL/PLATELET
Abs Immature Granulocytes: 0.03 10*3/uL (ref 0.00–0.07)
Basophils Absolute: 0.1 10*3/uL (ref 0.0–0.1)
Basophils Relative: 1 %
Eosinophils Absolute: 0.2 10*3/uL (ref 0.0–0.5)
Eosinophils Relative: 3 %
HCT: 31.2 % — ABNORMAL LOW (ref 36.0–46.0)
Hemoglobin: 9.7 g/dL — ABNORMAL LOW (ref 12.0–15.0)
Immature Granulocytes: 0 %
Lymphocytes Relative: 20 %
Lymphs Abs: 1.7 10*3/uL (ref 0.7–4.0)
MCH: 36.2 pg — ABNORMAL HIGH (ref 26.0–34.0)
MCHC: 31.1 g/dL (ref 30.0–36.0)
MCV: 116.4 fL — ABNORMAL HIGH (ref 80.0–100.0)
Monocytes Absolute: 0.8 10*3/uL (ref 0.1–1.0)
Monocytes Relative: 10 %
Neutro Abs: 5.5 10*3/uL (ref 1.7–7.7)
Neutrophils Relative %: 66 %
Platelets: 261 10*3/uL (ref 150–400)
RBC: 2.68 MIL/uL — ABNORMAL LOW (ref 3.87–5.11)
RDW: 14.8 % (ref 11.5–15.5)
WBC: 8.3 10*3/uL (ref 4.0–10.5)
nRBC: 0 % (ref 0.0–0.2)

## 2019-11-14 LAB — COMPREHENSIVE METABOLIC PANEL
ALT: 12 U/L (ref 0–44)
AST: 17 U/L (ref 15–41)
Albumin: 3.9 g/dL (ref 3.5–5.0)
Alkaline Phosphatase: 94 U/L (ref 38–126)
Anion gap: 13 (ref 5–15)
BUN: 44 mg/dL — ABNORMAL HIGH (ref 8–23)
CO2: 30 mmol/L (ref 22–32)
Calcium: 8.8 mg/dL — ABNORMAL LOW (ref 8.9–10.3)
Chloride: 95 mmol/L — ABNORMAL LOW (ref 98–111)
Creatinine, Ser: 6.66 mg/dL — ABNORMAL HIGH (ref 0.44–1.00)
GFR calc Af Amer: 6 mL/min — ABNORMAL LOW (ref >60)
GFR calc non Af Amer: 5 mL/min — ABNORMAL LOW (ref >60)
Glucose, Bld: 198 mg/dL — ABNORMAL HIGH (ref 70–99)
Potassium: 5.1 mmol/L (ref 3.5–5.1)
Sodium: 138 mmol/L (ref 135–145)
Total Bilirubin: 0.6 mg/dL (ref 0.3–1.2)
Total Protein: 6.7 g/dL (ref 6.5–8.1)

## 2019-11-14 MED ORDER — PROCHLORPERAZINE MALEATE 10 MG PO TABS
ORAL_TABLET | ORAL | Status: AC
  Filled 2019-11-14: qty 1

## 2019-11-14 MED ORDER — PROCHLORPERAZINE MALEATE 10 MG PO TABS
10.0000 mg | ORAL_TABLET | Freq: Once | ORAL | Status: AC
  Administered 2019-11-14: 10 mg via ORAL

## 2019-11-14 MED ORDER — DEXAMETHASONE 4 MG PO TABS
ORAL_TABLET | ORAL | Status: AC
  Filled 2019-11-14: qty 3

## 2019-11-14 MED ORDER — DEXAMETHASONE 4 MG PO TABS
10.0000 mg | ORAL_TABLET | Freq: Once | ORAL | Status: AC
  Administered 2019-11-14: 10 mg via ORAL

## 2019-11-14 MED ORDER — BORTEZOMIB CHEMO SQ INJECTION 3.5 MG (2.5MG/ML)
1.3000 mg/m2 | Freq: Once | INTRAMUSCULAR | Status: AC
  Administered 2019-11-14: 2.25 mg via SUBCUTANEOUS
  Filled 2019-11-14: qty 0.9

## 2019-11-14 NOTE — Progress Notes (Signed)
Carly Wood, Carly Wood [id number]   CLINIC:  Medical Oncology/Hematology  PCP:  Carly Wood [address] [place] [address] [phone]   REASON FOR VISIT:  Follow-up for plasma cell myeloma and anemia  PRIOR THERAPY: None  NGS Results: Not done  CURRENT THERAPY: Velcade & dexamethasone 3 weeks on, 1 week off  BRIEF ONCOLOGIC HISTORY:  Oncology History  Stage 1 infiltrating ductal carcinoma of right female breast ([place])  09/12/2014 Mammogram   Mass in upper outer R breast, middle third depth appears slightly larger than it was on prior exam with more irreg spiculated margins   10/02/2014 Pathology Results   biopsy with invasive ductal carcinoma high grade 1.1 cm, dcis solid type. additional R breast tissue, excision, invasive ductal high grade 0.9 cm    10/24/2014 Pathology Results   no residual invasive carcinoma, DCIS, focal, atypical ductal hyperplasia, 0/7 LN positive for metastatic carcinoma ER > 90%, PR 30%, HER 2 2+   10/24/2014 Cancer Staging   T1cN0M0   10/24/2014 Surgery   R mastectomy, T1c, N0   12/28/2014 - 11/22/2015 Chemotherapy   Taxol/herceptin weekly X 12, Herceptin every 21 days, last due in September 2017   03/11/2015 -  Anti-estrogen oral therapy   Arimidex 1 mg daily   09/12/2015 Imaging   MUGA- The left ventricular ejection fraction equals 68%.   06/08/2016 Treatment Plan Change   Started Nerlynx   Multiple myeloma ([place])  10/04/2018 Initial Diagnosis   Multiple myeloma ([place])   10/11/2018 -  Chemotherapy   The patient had dexamethasone (DECADRON) tablet 10 mg, 10 mg (100 % of original dose 10 mg), Oral,  Once, 15 of 16 cycles Dose modification: 10 mg (original dose 10 mg, Cycle 1) Administration: 10 mg (10/11/2018), 10 mg (10/18/2018), 10 mg (10/25/2018), 10 mg (11/01/2018), 10 mg (11/08/2018), 10 mg (11/15/2018), 10 mg (11/22/2018), 10 mg (11/29/2018), 10 mg (12/06/2018), 10 mg (12/20/2018), 10 mg (12/27/2018), 10  mg (01/03/2019), 10 mg (01/17/2019), 10 mg (01/31/2019), 10 mg (02/14/2019), 10 mg (02/21/2019), 10 mg (02/28/2019), 10 mg (03/14/2019), 10 mg (03/21/2019), 10 mg (03/28/2019), 10 mg (04/11/2019), 10 mg (04/18/2019), 10 mg (04/25/2019), 10 mg (05/16/2019), 10 mg (05/23/2019), 10 mg (05/30/2019), 10 mg (06/13/2019), 10 mg (06/20/2019), 10 mg (06/27/2019), 10 mg (07/11/2019), 10 mg (07/18/2019), 10 mg (07/25/2019), 10 mg (08/08/2019), 10 mg (08/15/2019), 10 mg (08/22/2019), 10 mg (09/05/2019), 10 mg (09/12/2019), 10 mg (09/19/2019), 10 mg (10/03/2019), 10 mg (10/10/2019), 10 mg (10/17/2019), 10 mg (10/31/2019), 10 mg (11/07/2019) bortezomib SQ (VELCADE) chemo injection 2.5 mg, 1.3 mg/m2 = 2.5 mg, Subcutaneous,  Once, 15 of 16 cycles Administration: 2.5 mg (10/11/2018), 2.5 mg (10/18/2018), 2.5 mg (10/25/2018), 2.5 mg (11/01/2018), 2.5 mg (11/08/2018), 2.5 mg (11/15/2018), 2.5 mg (11/22/2018), 2.5 mg (11/29/2018), 2.5 mg (12/06/2018), 2.5 mg (12/20/2018), 2.5 mg (12/27/2018), 2.5 mg (01/03/2019), 2.5 mg (01/17/2019), 2.5 mg (01/31/2019), 2.5 mg (02/14/2019), 2.5 mg (02/21/2019), 2.5 mg (02/28/2019), 2.5 mg (03/14/2019), 2.5 mg (03/21/2019), 2.5 mg (03/28/2019), 2.5 mg (04/11/2019), 2.5 mg (04/18/2019), 2.5 mg (04/25/2019), 2.5 mg (05/16/2019), 2.5 mg (05/23/2019), 2.5 mg (05/30/2019), 2.25 mg (06/13/2019), 2.25 mg (06/20/2019), 2.25 mg (06/27/2019), 2.25 mg (07/11/2019), 2.25 mg (07/18/2019), 2.25 mg (07/25/2019), 2.25 mg (08/08/2019), 2.25 mg (08/15/2019), 2.25 mg (08/22/2019), 2.25 mg (09/05/2019), 2.25 mg (09/12/2019), 2.25 mg (09/19/2019), 2.25 mg (10/03/2019), 2.25 mg (10/10/2019), 2.25 mg (10/17/2019), 2.25 mg (10/31/2019), 2.25 mg (11/07/2019)  for chemotherapy treatment.      CANCER STAGING: Cancer  Staging Stage 1 infiltrating ductal carcinoma of right female breast [facility]) Staging form: Breast, AJCC 7th Edition - Clinical stage from 08/30/2015: Stage IA (T1c, N0, M0) - Signed by Baird Cancer, PA-C on 08/30/2015   INTERVAL HISTORY:  Carly Wood, a 84 y.o. female, returns  for routine follow-up and consideration for next cycle of chemotherapy. Carly Wood was last seen on 10/03/2019.  Due for day #15 of cycle #15 of bortezomib today.   Today she is accompanied by her sister. Overall, she tells me she has been feeling pretty well. She is going to dialysis MWF. She has been tolerating the chemo treatments well and denies having diarrhea, though her feet as swollen.  She is now living at [facility] with her sister Carly Wood since 8/3.  Overall, she feels ready for next cycle of chemo today.    REVIEW OF SYSTEMS:  Review of Systems  Constitutional: Positive for fatigue (moderate). Negative for appetite change.  Cardiovascular: Positive for leg swelling (feet swelling).  All other systems reviewed and are negative.   PAST MEDICAL/SURGICAL HISTORY:  Past Medical History:  Diagnosis Date  . Anemia   . Anxiety   . Chronic kidney disease   . Chronic renal disease, stage 4, severely decreased glomerular filtration rate (GFR) between 15-29 mL/min/1.73 square meter (HCC) 08/22/2015  . Complication of anesthesia    delirious after Breast Surgery  . Dementia ([place])    mild  . Depression   . Diabetes mellitus without complication ([place])    type II  . Dyspnea    with activity  . GERD (gastroesophageal reflux disease)   . Glaucoma   . Hypertension   . Pneumonia   . Stage 1 infiltrating ductal carcinoma of right female breast ([place]) 08/21/2015   ER+ PR+ HER 2 neu + (3+) T1cN0    Past Surgical History:  Procedure Laterality Date  . AV FISTULA PLACEMENT Left 11/22/2017   Procedure: ARTERIOVENOUS (AV) FISTULA CREATION LEFT ARM;  Surgeon: Elam Dutch, MD;  Location: [facility];  Service: Vascular;  Laterality: Left;  . BIOPSY  08/07/2016   Procedure: BIOPSY;  Surgeon: Daneil Dolin, MD;  [facility];  Service: Endoscopy;;  gastric ulcer biopsy  . COLONOSCOPY    . ESOPHAGOGASTRODUODENOSCOPY N/A 08/07/2016   Procedure: ESOPHAGOGASTRODUODENOSCOPY (EGD);  Surgeon:  Daneil Dolin, MD;  [facility];  Service: Endoscopy;  Laterality: N/A;  1215-rescheduled to 6/1 @ 2:30pm per Tretha Sciara  . ESOPHAGOGASTRODUODENOSCOPY N/A 11/27/2016   Procedure: ESOPHAGOGASTRODUODENOSCOPY (EGD);  Surgeon: Daneil Dolin, MD;  [facility];  Service: Endoscopy;  Laterality: N/A;  8:15am  . FISTULA SUPERFICIALIZATION Left 02/14/2018   Procedure: FISTULA SUPERFICIALIZATION LEFT ARM;  Surgeon: Angelia Mould, MD;  Location: [facility];  Service: Vascular;  Laterality: Left;  . FRACTURE SURGERY Right    ankle  . MALONEY DILATION N/A 08/07/2016   Procedure: Venia Minks DILATION;  Surgeon: Daneil Dolin, MD;  [facility];  Service: Endoscopy;  Laterality: N/A;  . MASTECTOMY, PARTIAL Right   . PERIPHERAL VASCULAR BALLOON ANGIOPLASTY Left 07/13/2019   Procedure: PERIPHERAL VASCULAR BALLOON ANGIOPLASTY;  Surgeon: Marty Heck, MD;  Location: [facility];  Service: Cardiovascular;  Laterality: Left;  arm fistulogram  . PORT-A-CATH REMOVAL Left 11/22/2017   Procedure: REMOVAL PORT-A-CATH LEFT CHEST;  Surgeon: Elam Dutch, MD;  Location: [facility] OR;  Service: Vascular;  Laterality: Left;  . RETINAL DETACHMENT SURGERY Right     SOCIAL  HISTORY:  Social History   Socioeconomic History  . Marital status: Single    Spouse name: Not on file  . Number of children: Not on file  . Years of education: Not on file  . Highest education level: Not on file  Occupational History  . Occupation: retired   Tobacco Use  . Smoking status: Never Smoker  . Smokeless tobacco: Never Used  Vaping Use  . Vaping Use: Never used  Substance and Sexual Activity  . Alcohol use: No    Alcohol/week: 0.0 standard drinks  . Drug use: No  . Sexual activity: Never  Other Topics Concern  . Not on file  Social History Narrative   Long term resident of SNF    Social Determinants of Health   Financial Resource Strain:   . Difficulty of Paying Living Expenses:  Not on file  Food Insecurity:   . Worried About Charity fundraiser in the Last Year: Not on file  . Ran Out of Food in the Last Year: Not on file  Transportation Needs:   . Lack of Transportation (Medical): Not on file  . Lack of Transportation (Non-Medical): Not on file  Physical Activity:   . Days of Exercise per Week: Not on file  . Minutes of Exercise per Session: Not on file  Stress:   . Feeling of Stress : Not on file  Social Connections:   . Frequency of Communication with Friends and Family: Not on file  . Frequency of Social Gatherings with Friends and Family: Not on file  . Attends Religious Services: Not on file  . Active Member of Clubs or Organizations: Not on file  . Attends Archivist Meetings: Not on file  . Marital Status: Not on file  Intimate Partner Violence:   . Fear of Current or Ex-Partner: Not on file  . Emotionally Abused: Not on file  . Physically Abused: Not on file  . Sexually Abused: Not on file    FAMILY HISTORY:  Family History  Problem Relation Age of Onset  . Multiple myeloma Sister   . Brain cancer Sister   . Dementia Mother        died at 40  . Stroke Mother   . Heart failure Mother   . Diabetes Mother   . Heart disease Father   . Prostate cancer Brother   . Colon cancer Neg Hx     CURRENT MEDICATIONS:  Current Outpatient Medications  Medication Sig Dispense Refill  . anastrozole (ARIMIDEX) 1 MG tablet TAKE 1 TABLET BY MOUTH DAILY 30 tablet 6   No current facility-administered medications for this visit.    ALLERGIES:  Allergies  Allergen Reactions  . Penicillins Other (See Comments)    Unsure of reaction Has patient had a PCN reaction causing immediate rash, facial/tongue/throat swelling, SOB or lightheadedness with hypotension: Unknown Has patient had a PCN reaction causing severe rash involving mucus membranes or skin necrosis: Unknown Has patient had a PCN reaction that required hospitalization: No Has patient  had a PCN reaction occurring within the last 10 years: Unknown If all of the above answers are "NO", then may proceed with Cephalosporin use.    . Ace Inhibitors Cough and Other (See Comments)    Tongue swell  . Lisinopril Other (See Comments)    Tongue swell    PHYSICAL EXAM:  Performance status (ECOG): 2 - Symptomatic, <50% confined to bed  Vitals:   11/14/19 1407  BP: (!) 102/49  Pulse:  62  Resp: 17  Temp: (!) 96.9 F (36.1 C)  SpO2: 100%   Wt Readings from Last 3 Encounters:  11/14/19 132 lb (59.9 kg)  11/08/19 133 lb 9.6 oz (60.6 kg)  11/07/19 129 lb 8 oz (58.7 kg)   Physical Exam Vitals reviewed.  Constitutional:      Appearance: Normal appearance.  Cardiovascular:     Rate and Rhythm: Normal rate and regular rhythm.     Pulses: Normal pulses.     Heart sounds: Normal heart sounds.  Pulmonary:     Effort: Pulmonary effort is normal.     Breath sounds: Normal breath sounds.  Musculoskeletal:     Right lower leg: No edema.     Left lower leg: No edema.  Neurological:     General: No focal deficit present.     Mental Status: She is alert and oriented to person, place, and time.  Psychiatric:        Mood and Affect: Mood normal.        Behavior: Behavior normal.     LABORATORY DATA:  I have reviewed the labs as listed.  CBC Latest Ref Rng & Units 11/14/2019 11/07/2019 10/31/2019  WBC 4.0 - 10.5 K/uL 8.3 6.1 6.0  Hemoglobin 12.0 - 15.0 g/dL 9.7(L) 9.2(L) 8.8(L)  Hematocrit 36 - 46 % 31.2(L) 29.4(L) 28.6(L)  Platelets 150 - 400 K/uL 261 276 326   CMP Latest Ref Rng & Units 11/14/2019 11/07/2019 10/31/2019  Glucose 70 - 99 mg/dL 198(H) 201(H) 188(H)  BUN 8 - 23 mg/dL 44(H) 44(H) 45(H)  Creatinine 0.44 - 1.00 mg/dL 6.66(H) 5.93(H) 6.28(H)  Sodium 135 - 145 mmol/L 138 136 137  Potassium 3.5 - 5.1 mmol/L 5.1 4.5 3.9  Chloride 98 - 111 mmol/L 95(L) 95(L) 94(L)  CO2 22 - 32 mmol/L '30 27 29  ' Calcium 8.9 - 10.3 mg/dL 8.8(L) 9.1 9.0  Total Protein 6.5 - 8.1 g/dL 6.7  6.8 6.9  Total Bilirubin 0.3 - 1.2 mg/dL 0.6 0.4 0.4  Alkaline Phos 38 - 126 U/L 94 88 91  AST 15 - 41 U/L '17 19 20  ' ALT 0 - 44 U/L '12 11 12   ' Lab Results  Component Value Date   LDH 160 10/17/2019   LDH 136 10/03/2019   LDH 145 08/08/2019   Lab Results  Component Value Date   TOTALPROTELP 6.2 11/07/2019   ALBUMINELP 3.6 11/07/2019   A1GS 0.2 11/07/2019   A2GS 0.8 11/07/2019   BETS 0.9 11/07/2019   GAMS 0.7 11/07/2019   MSPIKE Not Observed 11/07/2019   SPEI Comment 11/07/2019   Lab Results  Component Value Date   KPAFRELGTCHN 146.9 (H) 11/07/2019   LAMBDASER 129.1 (H) 11/07/2019   KAPLAMBRATIO 1.14 11/07/2019    DIAGNOSTIC IMAGING:  I have independently reviewed the scans and discussed with the patient. No results found.   ASSESSMENT:  1. IgA lambda plasma cell myeloma, stage II, standard risk: -BMBX on 09/20/2018 shows plasma cell myeloma, 30% plasma cells. FISH panel was normal. Chromosome analysis normal. -Labs at diagnosis on 08/29/2018 with M spike 0.9 g. Kappa light chains 148, lambda light chains 1132, ratio 0.13. LDH normal. Beta-2 microglobulin 13.5. She had transfusion dependent anemia. -Velcade and dexamethasone started on 10/11/2018. -Myeloma panel on 07/11/2019 shows SPEP is negative. Free light chain ratio improved to 1.09. Lambda light chains are 128, improved from 141. Kappa light chains are 140.  2. Stage I IDC of the right breast, ER/PR positive, HER-2 negative: -She is  on anastrozole.   PLAN:  1. IgA lambda plasma cell myeloma: -She has [facility] at [facility] since 10[facility]12[facility]. -She is tolerating Velcade and dexamethasone very well.  No neuropathy yet. -Reviewed labs from 11/07/2019 which showed SPEP with no M spike.  Lambda light chains are 129 and improved.  Ratio is 1.14.  Kappa light chains are 146. -She will continue Velcade and dexamethasone 3 weeks on 1 week off.  I plan to see her back in 8 weeks for follow-up.  2. ESRD on  HD: -Continue HD on Monday, Wednesday and Friday.  3. Bone strengthening: -Bisphosphonates not started due to dialysis.  4. Osteopenia: -Bone density on 05/16/2015 was osteopenic.  We will plan to repeat DEXA scan soon.  5. Right breast cancer: -Continue anastrozole.   Orders placed this encounter:  No orders of the defined types were placed in this encounter.    Derek Jack, MD [facility] [phone]   I, Milinda Antis, am acting as a scribe for Dr. Sanda Linger.  I, Derek Jack MD, have reviewed the above documentation for accuracy and completeness, and I agree with the above.

## 2019-11-14 NOTE — Patient Instructions (Signed)
[place] at [facility] Discharge Instructions  You were seen today by Dr. Delton Coombes. He went over your recent results. You received your injection today; continue receiving your weekly injections. Dr. Delton Coombes will see you back in 8 weeks for labs and follow up.   Thank you for choosing [place] at [facility] to provide your oncology and hematology care.  To afford each patient quality time with our provider, please arrive at least 15 minutes before your scheduled appointment time.   If you have a lab appointment with the [facility] please come in thru the Main Entrance and check in at the main information desk  You need to re-schedule your appointment should you arrive 10 or more minutes late.  We strive to give you quality time with our providers, and arriving late affects you and other patients whose appointments are after yours.  Also, if you no show three or more times for appointments you may be dismissed from the clinic at the providers discretion.     Again, thank you for choosing [facility].  Our hope is that these requests will decrease the amount of time that you wait before being seen by our physicians.       _____________________________________________________________  Should you have questions after your visit to [facility], please contact our office at [phone] between the hours of 8:00 a.m. and 4:30 p.m.  Voicemails left after 4:00 p.m. will not be returned until the following business day.  For prescription refill requests, have your pharmacy contact our office and allow 72 hours.    Cancer Center Support Programs:   > Cancer Support Group  2nd Tuesday of the month 1pm-2pm, Journey Room

## 2019-11-14 NOTE — Progress Notes (Signed)
Patient presents today for treatment and follow up visit with Dr. Delton Coombes. Vital signs are stable. Creatinine 6.66 today. Patient's normal. Dialysis patient.   Treatment given today per MD orders. Tolerated infusion without adverse affects. Vital signs stable. No complaints at this time. Discharged from clinic via wheel chair.  F/U with [facility] as scheduled.

## 2019-11-14 NOTE — Patient Instructions (Signed)
[facility] Discharge Instructions for Patients Receiving Chemotherapy  Today you received the following chemotherapy agents   To help prevent nausea and vomiting after your treatment, we encourage you to take your nausea medication   If you develop nausea and vomiting that is not controlled by your nausea medication, call the clinic.   BELOW ARE SYMPTOMS THAT SHOULD BE REPORTED IMMEDIATELY:  *FEVER GREATER THAN 100.5 F  *CHILLS WITH OR WITHOUT FEVER  NAUSEA AND VOMITING THAT IS NOT CONTROLLED WITH YOUR NAUSEA MEDICATION  *UNUSUAL SHORTNESS OF BREATH  *UNUSUAL BRUISING OR BLEEDING  TENDERNESS IN MOUTH AND THROAT WITH OR WITHOUT PRESENCE OF ULCERS  *URINARY PROBLEMS  *BOWEL PROBLEMS  UNUSUAL RASH Items with * indicate a potential emergency and should be followed up as soon as possible.  Feel free to call the clinic should you have any questions or concerns. The clinic phone number is [phone].  Please show the CHEMO ALERT CARD at check-in to the Emergency Department and triage nurse.   

## 2019-11-15 DIAGNOSIS — Z992 Dependence on renal dialysis: Secondary | ICD-10-CM | POA: Diagnosis not present

## 2019-11-15 DIAGNOSIS — N186 End stage renal disease: Secondary | ICD-10-CM | POA: Diagnosis not present

## 2019-11-15 MED ORDER — OCTREOTIDE ACETATE 30 MG IM KIT
PACK | INTRAMUSCULAR | Status: AC
  Filled 2019-11-15: qty 1

## 2019-11-16 DIAGNOSIS — Z1159 Encounter for screening for other viral diseases: Secondary | ICD-10-CM | POA: Diagnosis not present

## 2019-11-16 DIAGNOSIS — C9000 Multiple myeloma not having achieved remission: Secondary | ICD-10-CM | POA: Diagnosis not present

## 2019-11-16 MED ORDER — DENOSUMAB 60 MG/ML ~~LOC~~ SOSY
PREFILLED_SYRINGE | SUBCUTANEOUS | Status: AC
  Filled 2019-11-16: qty 1

## 2019-11-17 ENCOUNTER — Encounter: Payer: Self-pay | Admitting: Adult Health

## 2019-11-17 ENCOUNTER — Non-Acute Institutional Stay (SKILLED_NURSING_FACILITY): Payer: Medicare PPO | Admitting: Adult Health

## 2019-11-17 DIAGNOSIS — E1122 Type 2 diabetes mellitus with diabetic chronic kidney disease: Secondary | ICD-10-CM

## 2019-11-17 DIAGNOSIS — N186 End stage renal disease: Secondary | ICD-10-CM

## 2019-11-17 DIAGNOSIS — B009 Herpesviral infection, unspecified: Secondary | ICD-10-CM

## 2019-11-17 DIAGNOSIS — F0151 Vascular dementia with behavioral disturbance: Secondary | ICD-10-CM

## 2019-11-17 DIAGNOSIS — F01518 Vascular dementia, unspecified severity, with other behavioral disturbance: Secondary | ICD-10-CM

## 2019-11-17 DIAGNOSIS — Z992 Dependence on renal dialysis: Secondary | ICD-10-CM | POA: Diagnosis not present

## 2019-11-17 MED ORDER — OCTREOTIDE ACETATE 30 MG IM KIT
PACK | INTRAMUSCULAR | Status: AC
  Filled 2019-11-17: qty 1

## 2019-11-17 NOTE — Progress Notes (Signed)
Location:    [facility] Room Number: 148/D Place of Service:  SNF (31)   CODE STATUS: Full Code  Allergies  Allergen Reactions   Penicillins Other (See Comments)    Unsure of reaction Has patient had a PCN reaction causing immediate rash, facial/tongue/throat swelling, SOB or lightheadedness with hypotension: Unknown Has patient had a PCN reaction causing severe rash involving mucus membranes or skin necrosis: Unknown Has patient had a PCN reaction that required hospitalization: No Has patient had a PCN reaction occurring within the last 10 years: Unknown If all of the above answers are "NO", then may proceed with Cephalosporin use.     Ace Inhibitors Cough and Other (See Comments)    Tongue swell   Lisinopril Other (See Comments)    Tongue swell    Chief Complaint  Patient presents with   Medical Management of Chronic Issues           Herpes:    Vascular dementia with behavioral disturbance:Diabetes with end stage renal disease (ESRD)    HPI:  She is a 84 year old long term resident of this facility being seen for the management of her chronic illnesses; Herpes:    Vascular dementia with behavioral disturbance:Diabetes with end stage renal disease (ESRD). There are no reports of uncontrolled pain; no reports of anxiety or agitation. No reports of constipation; or heart burn.   Past Medical History:  Diagnosis Date   Anemia    Anxiety    Chronic kidney disease    Chronic renal disease, stage 4, severely decreased glomerular filtration rate (GFR) between 15-29 mL/min/1.73 square meter ([place]) 0/45/9977   Complication of anesthesia    delirious after Breast Surgery   Dementia ([place])    mild   Depression    Diabetes mellitus without complication ([place])    type II   Dyspnea    with activity   GERD (gastroesophageal reflux disease)    Glaucoma    Hypertension    Pneumonia    Stage 1 infiltrating ductal carcinoma of right female  breast ([place]) 08/21/2015   ER+ PR+ HER 2 neu + (3+) T1cN0     Past Surgical History:  Procedure Laterality Date   AV FISTULA PLACEMENT Left 11/22/2017   Procedure: ARTERIOVENOUS (AV) FISTULA CREATION LEFT ARM;  Surgeon: Elam Dutch, MD;  Location: [facility];  Service: Vascular;  Laterality: Left;   BIOPSY  08/07/2016   Procedure: BIOPSY;  Surgeon: Daneil Dolin, MD;  [facility];  Service: Endoscopy;;  gastric ulcer biopsy   COLONOSCOPY     ESOPHAGOGASTRODUODENOSCOPY N/A 08/07/2016   Procedure: ESOPHAGOGASTRODUODENOSCOPY (EGD);  Surgeon: Daneil Dolin, MD;  [facility];  Service: Endoscopy;  Laterality: N/A;  1215-rescheduled to 6/1 @ 2:30pm per Tretha Sciara   ESOPHAGOGASTRODUODENOSCOPY N/A 11/27/2016   Procedure: ESOPHAGOGASTRODUODENOSCOPY (EGD);  Surgeon: Daneil Dolin, MD;  [facility];  Service: Endoscopy;  Laterality: N/A;  8:15am   FISTULA SUPERFICIALIZATION Left 02/14/2018   Procedure: FISTULA SUPERFICIALIZATION LEFT ARM;  Surgeon: Angelia Mould, MD;  Location: [facility];  Service: Vascular;  Laterality: Left;   FRACTURE SURGERY Right    ankle   MALONEY DILATION N/A 08/07/2016   Procedure: Venia Minks DILATION;  Surgeon: Daneil Dolin, MD;  [facility];  Service: Endoscopy;  Laterality: N/A;   MASTECTOMY, PARTIAL Right    PERIPHERAL VASCULAR BALLOON ANGIOPLASTY Left 07/13/2019   Procedure: PERIPHERAL VASCULAR BALLOON ANGIOPLASTY;  Surgeon: Marty Heck,  MD;  Location: [facility];  Service: Cardiovascular;  Laterality: Left;  arm fistulogram   PORT-A-CATH REMOVAL Left 11/22/2017   Procedure: REMOVAL PORT-A-CATH LEFT CHEST;  Surgeon: Elam Dutch, MD;  Location: [facility];  Service: Vascular;  Laterality: Left;   RETINAL DETACHMENT SURGERY Right     Social History   Socioeconomic History   Marital status: Single    Spouse name: Not on file   Number of children: Not on file   Years of education: Not on file    Highest education level: Not on file  Occupational History   Occupation: retired   Tobacco Use   Smoking status: Never Smoker   Smokeless tobacco: Never Used  Scientific laboratory technician Use: Never used  Substance and Sexual Activity   Alcohol use: No    Alcohol/week: 0.0 standard drinks   Drug use: No   Sexual activity: Never  Other Topics Concern   Not on file  Social History Narrative   Long term resident of SNF    Social Determinants of Health   Financial Resource Strain:    Difficulty of Paying Living Expenses: Not on file  Food Insecurity:    Worried About Charity fundraiser in the Last Year: Not on file   [employer] of Food in the Last Year: Not on file  Transportation Needs:    Lack of Transportation (Medical): Not on file   Lack of Transportation (Non-Medical): Not on file  Physical Activity:    Days of Exercise per Week: Not on file   Minutes of Exercise per Session: Not on file  Stress:    Feeling of Stress : Not on file  Social Connections:    Frequency of Communication with Friends and Family: Not on file   Frequency of Social Gatherings with Friends and Family: Not on file   Attends Religious Services: Not on file   Active Member of Clubs or Organizations: Not on file   Attends Archivist Meetings: Not on file   Marital Status: Not on file  Intimate Partner Violence:    Fear of Current or Ex-Partner: Not on file   Emotionally Abused: Not on file   Physically Abused: Not on file   Sexually Abused: Not on file   Family History  Problem Relation Age of Onset   Multiple myeloma Sister    Brain cancer Sister    Dementia Mother        died at 57   Stroke Mother    Heart failure Mother    Diabetes Mother    Heart disease Father    Prostate cancer Brother    Colon cancer Neg Hx       VITAL SIGNS BP 126/75    Pulse 70    Temp 98.6 F (37 C) (Oral)    Resp 18    Ht '5\' 3"'  (1.6 m)    Wt 133 lb 9.6 oz (60.6 kg)     SpO2 98%    BMI 23.67 kg/m   Outpatient Encounter Medications as of 11/17/2019  Medication Sig   acetaminophen (TYLENOL) 500 MG tablet Take 500 mg by mouth every 6 (six) hours as needed for moderate pain or headache.    acyclovir (ZOVIRAX) 200 MG capsule Take 200 mg by mouth daily.   amLODipine (NORVASC) 10 MG tablet Take 10 mg by mouth daily. Not taking on dialysis days   anastrozole (ARIMIDEX) 1 MG tablet TAKE 1 TABLET BY MOUTH DAILY  aspirin EC 81 MG tablet Take 81 mg by mouth daily.   Calcium Carbonate-Vitamin D (CALCIUM 600+D PO) Take 1 tablet by mouth daily.   cloNIDine (CATAPRES) 0.3 MG tablet Take 0.3 mg by mouth 2 (two) times daily.    melatonin 3 MG TABS tablet Take 6 mg by mouth at bedtime. For Sleep   Multiple Vitamin (MULTIVITAMIN) capsule Take 1 capsule by mouth daily.   NON FORMULARY Diet: _____ Regular, ___x___ NAS, _______Consistent Carbohydrate, _______NPO _____Other   Augusto Gamble #3310 to ankle for safety awareness. Check placement and function qshift. Every Shift Day, Evening, Night   Nutritional Supplements (NEPRO PO) Take 1 Can by mouth daily.   [DISCONTINUED] Glucerna (GLUCERNA) LIQD Take 237 mLs by mouth daily.   No facility-administered encounter medications on file as of 11/17/2019.     SIGNIFICANT DIAGNOSTIC EXAMS   LABS REVIEWED PREVIOUS  10-13-19: hgb a1c 6.0; vit B 12: 1126 folate 20.9 10-17-19: wbc 8.8; hgb 8.3; hct 26.1; mcv 114.0 plt 270; glucose 190; bun 28; creat 4.85; k+ 3.9; na++ 127; ca 8.9 liver normal albumin 4.0   TODAY  10-31-19: wbc 6.0; hgb 8.8; hct 28.6; mcv 117.2 plt 326; glucose 188; bun 45; creat 6.28; k+ 3.9; na++ 137;ca 9.1 liver normal albumin 3.9 11-07-19: wbc 6.1; hgb 9.2; hct 29.4 mcv 115.7 plt 276; glucose 201; bun 44; creat 5.93; k+ 4.5; na++ 136; ca 9.1 liver normal albumin 3.8 11-14-19; wbc 8.3; hgb 9.7; hct 31.2 mcv 116.4 plt 261; glucose 198; bun 44; creat 6.66; k+ 5.1; na++ 138; ca 8.8 liver  normal albumin 3.9    Review of Systems  Constitutional: Negative for malaise/fatigue.  Respiratory: Negative for cough and shortness of breath.   Cardiovascular: Negative for chest pain, palpitations and leg swelling.  Gastrointestinal: Negative for abdominal pain, constipation and heartburn.  Musculoskeletal: Negative for back pain, joint pain and myalgias.  Skin: Negative.   Neurological: Negative for dizziness.  Psychiatric/Behavioral: The patient is not nervous/anxious.    Physical Exam Constitutional:      General: She is not in acute distress.    Appearance: She is well-developed. She is not diaphoretic.  Neck:     Thyroid: No thyromegaly.  Cardiovascular:     Rate and Rhythm: Normal rate and regular rhythm.     Heart sounds: Normal heart sounds.     Comments: Pedal pulse faint  Pulmonary:     Effort: Pulmonary effort is normal. No respiratory distress.     Breath sounds: Normal breath sounds.  Abdominal:     General: Bowel sounds are normal. There is no distension.     Palpations: Abdomen is soft.     Tenderness: There is no abdominal tenderness.  Musculoskeletal:        General: Normal range of motion.     Cervical back: Neck supple.     Right lower leg: No edema.     Left lower leg: No edema.  Lymphadenopathy:     Cervical: No cervical adenopathy.  Skin:    General: Skin is warm and dry.     Comments: Left arm a/v fistula: + thrill + bruit    Neurological:     Mental Status: She is alert. Mental status is at baseline.  Psychiatric:        Mood and Affect: Mood normal.      ASSESSMENT/ PLAN:  TODAY  1. Herpes: no reports of outbreaks will continue acyclovir 200 mg twice daily   2. Vascular dementia with  behavioral disturbance: is without change weight is 133 pounds will monitor   3. Diabetes with end stage renal disease (ESRD) is stable hgb a1c 6.0 will monitor   PREVIOUS   4. Chronic kidney disease with end stage renal disease on dialysis due to  type 2 diabetes mellitus / dependence on dialysis is stable will continue dialysis three days per week  5. Hypertension due to end stage renal disease due to type 2 diabetes mellitus is stable b/p 126/75 will continue norvasc 10 mg four times weekly and clonidine 0.3 mg twice daily asa 81 mg daily   6. Stage 1 infiltrating ductal carcinoma of right female breast is stable is followed by oncology will continue armidex 1 mg daily   7. Multiple myeloma not achieving remission: is without change is followed by oncology  8. Anemia due to end stage renal disease is stable hgb 9.5 will monitor    Will get  dexa scan.    MD is aware of resident's narcotic use and is in agreement with current plan of care. We will attempt to wean resident as appropriate.  Ok Edwards NP [facility]  Contact [phone] Monday through Friday 8am- 5pm  After hours call [phone]

## 2019-11-20 DIAGNOSIS — Z1159 Encounter for screening for other viral diseases: Secondary | ICD-10-CM | POA: Diagnosis not present

## 2019-11-20 DIAGNOSIS — Z992 Dependence on renal dialysis: Secondary | ICD-10-CM | POA: Diagnosis not present

## 2019-11-20 DIAGNOSIS — N186 End stage renal disease: Secondary | ICD-10-CM | POA: Diagnosis not present

## 2019-11-20 DIAGNOSIS — C9000 Multiple myeloma not having achieved remission: Secondary | ICD-10-CM | POA: Diagnosis not present

## 2019-11-22 DIAGNOSIS — Z992 Dependence on renal dialysis: Secondary | ICD-10-CM | POA: Diagnosis not present

## 2019-11-22 DIAGNOSIS — N186 End stage renal disease: Secondary | ICD-10-CM | POA: Diagnosis not present

## 2019-11-23 DIAGNOSIS — Z1159 Encounter for screening for other viral diseases: Secondary | ICD-10-CM | POA: Diagnosis not present

## 2019-11-23 DIAGNOSIS — C9000 Multiple myeloma not having achieved remission: Secondary | ICD-10-CM | POA: Diagnosis not present

## 2019-11-24 DIAGNOSIS — Z992 Dependence on renal dialysis: Secondary | ICD-10-CM | POA: Diagnosis not present

## 2019-11-24 DIAGNOSIS — N186 End stage renal disease: Secondary | ICD-10-CM | POA: Diagnosis not present

## 2019-11-27 DIAGNOSIS — N186 End stage renal disease: Secondary | ICD-10-CM | POA: Diagnosis not present

## 2019-11-27 DIAGNOSIS — C9000 Multiple myeloma not having achieved remission: Secondary | ICD-10-CM | POA: Diagnosis not present

## 2019-11-27 DIAGNOSIS — Z992 Dependence on renal dialysis: Secondary | ICD-10-CM | POA: Diagnosis not present

## 2019-11-27 DIAGNOSIS — Z1159 Encounter for screening for other viral diseases: Secondary | ICD-10-CM | POA: Diagnosis not present

## 2019-11-28 ENCOUNTER — Other Ambulatory Visit: Payer: Self-pay

## 2019-11-28 ENCOUNTER — Inpatient Hospital Stay (HOSPITAL_COMMUNITY): Payer: Medicare PPO

## 2019-11-28 VITALS — BP 96/46 | HR 64 | Temp 96.8°F | Resp 20 | Wt 130.6 lb

## 2019-11-28 DIAGNOSIS — Z79811 Long term (current) use of aromatase inhibitors: Secondary | ICD-10-CM | POA: Diagnosis not present

## 2019-11-28 DIAGNOSIS — C9000 Multiple myeloma not having achieved remission: Secondary | ICD-10-CM

## 2019-11-28 DIAGNOSIS — Z9011 Acquired absence of right breast and nipple: Secondary | ICD-10-CM | POA: Diagnosis not present

## 2019-11-28 DIAGNOSIS — I129 Hypertensive chronic kidney disease with stage 1 through stage 4 chronic kidney disease, or unspecified chronic kidney disease: Secondary | ICD-10-CM | POA: Diagnosis not present

## 2019-11-28 DIAGNOSIS — C50411 Malignant neoplasm of upper-outer quadrant of right female breast: Secondary | ICD-10-CM | POA: Diagnosis not present

## 2019-11-28 DIAGNOSIS — Z992 Dependence on renal dialysis: Secondary | ICD-10-CM | POA: Diagnosis not present

## 2019-11-28 DIAGNOSIS — Z5111 Encounter for antineoplastic chemotherapy: Secondary | ICD-10-CM | POA: Diagnosis not present

## 2019-11-28 DIAGNOSIS — D630 Anemia in neoplastic disease: Secondary | ICD-10-CM | POA: Diagnosis not present

## 2019-11-28 DIAGNOSIS — N184 Chronic kidney disease, stage 4 (severe): Secondary | ICD-10-CM | POA: Diagnosis not present

## 2019-11-28 LAB — CBC WITH DIFFERENTIAL/PLATELET
Abs Immature Granulocytes: 0.02 K/uL (ref 0.00–0.07)
Basophils Absolute: 0 K/uL (ref 0.0–0.1)
Basophils Relative: 1 %
Eosinophils Absolute: 0.1 K/uL (ref 0.0–0.5)
Eosinophils Relative: 2 %
HCT: 34.1 % — ABNORMAL LOW (ref 36.0–46.0)
Hemoglobin: 10.6 g/dL — ABNORMAL LOW (ref 12.0–15.0)
Immature Granulocytes: 0 %
Lymphocytes Relative: 18 %
Lymphs Abs: 0.9 K/uL (ref 0.7–4.0)
MCH: 35.5 pg — ABNORMAL HIGH (ref 26.0–34.0)
MCHC: 31.1 g/dL (ref 30.0–36.0)
MCV: 114 fL — ABNORMAL HIGH (ref 80.0–100.0)
Monocytes Absolute: 0.4 K/uL (ref 0.1–1.0)
Monocytes Relative: 8 %
Neutro Abs: 3.8 K/uL (ref 1.7–7.7)
Neutrophils Relative %: 71 %
Platelets: 288 K/uL (ref 150–400)
RBC: 2.99 MIL/uL — ABNORMAL LOW (ref 3.87–5.11)
RDW: 14.9 % (ref 11.5–15.5)
WBC: 5.3 K/uL (ref 4.0–10.5)
nRBC: 0 % (ref 0.0–0.2)

## 2019-11-28 LAB — COMPREHENSIVE METABOLIC PANEL
ALT: 12 U/L (ref 0–44)
AST: 16 U/L (ref 15–41)
Albumin: 3.6 g/dL (ref 3.5–5.0)
Alkaline Phosphatase: 91 U/L (ref 38–126)
Anion gap: 15 (ref 5–15)
BUN: 52 mg/dL — ABNORMAL HIGH (ref 8–23)
CO2: 28 mmol/L (ref 22–32)
Calcium: 8.5 mg/dL — ABNORMAL LOW (ref 8.9–10.3)
Chloride: 92 mmol/L — ABNORMAL LOW (ref 98–111)
Creatinine, Ser: 8.63 mg/dL — ABNORMAL HIGH (ref 0.44–1.00)
GFR calc Af Amer: 5 mL/min — ABNORMAL LOW (ref >60)
GFR calc non Af Amer: 4 mL/min — ABNORMAL LOW (ref >60)
Glucose, Bld: 385 mg/dL — ABNORMAL HIGH (ref 70–99)
Potassium: 5.9 mmol/L — ABNORMAL HIGH (ref 3.5–5.1)
Sodium: 135 mmol/L (ref 135–145)
Total Bilirubin: 0.5 mg/dL (ref 0.3–1.2)
Total Protein: 6.7 g/dL (ref 6.5–8.1)

## 2019-11-28 MED ORDER — DEXAMETHASONE 4 MG PO TABS
10.0000 mg | ORAL_TABLET | Freq: Once | ORAL | Status: AC
  Administered 2019-11-28: 10 mg via ORAL
  Filled 2019-11-28: qty 3

## 2019-11-28 MED ORDER — PROCHLORPERAZINE MALEATE 10 MG PO TABS
10.0000 mg | ORAL_TABLET | Freq: Once | ORAL | Status: AC
  Administered 2019-11-28: 10 mg via ORAL
  Filled 2019-11-28: qty 1

## 2019-11-28 MED ORDER — BORTEZOMIB CHEMO SQ INJECTION 3.5 MG (2.5MG/ML)
1.3000 mg/m2 | Freq: Once | INTRAMUSCULAR | Status: AC
  Administered 2019-11-28: 2.25 mg via SUBCUTANEOUS
  Filled 2019-11-28: qty 0.9

## 2019-11-28 NOTE — Patient Instructions (Signed)
[facility] Discharge Instructions for Patients Receiving Chemotherapy   Beginning January 23rd 2017 lab work for the Cancer Center will be done in the  Main lab at [facility] on 1st floor. If you have a lab appointment with the Cancer Center please come in thru the  Main Entrance and check in at the main information desk   Today you received the following chemotherapy agents Velcade  To help prevent nausea and vomiting after your treatment, we encourage you to take your nausea medication    If you develop nausea and vomiting, or diarrhea that is not controlled by your medication, call the clinic.  The clinic phone number is [phone]. Office hours are Monday-Friday 8:30am-5:00pm.  BELOW ARE SYMPTOMS THAT SHOULD BE REPORTED IMMEDIATELY:  *FEVER GREATER THAN 101.0 F  *CHILLS WITH OR WITHOUT FEVER  NAUSEA AND VOMITING THAT IS NOT CONTROLLED WITH YOUR NAUSEA MEDICATION  *UNUSUAL SHORTNESS OF BREATH  *UNUSUAL BRUISING OR BLEEDING  TENDERNESS IN MOUTH AND THROAT WITH OR WITHOUT PRESENCE OF ULCERS  *URINARY PROBLEMS  *BOWEL PROBLEMS  UNUSUAL RASH Items with * indicate a potential emergency and should be followed up as soon as possible. If you have an emergency after office hours please contact your primary care physician or go to the nearest emergency department.  Please call the clinic during office hours if you have any questions or concerns.   You may also contact the Patient Navigator at [phone] should you have any questions or need assistance in obtaining follow up care.      Resources For Cancer Patients and their Caregivers ? American Cancer Society: Can assist with transportation, wigs, general needs, runs Look Good Feel Better.        1-888-227-6333 ? Cancer Care: Provides financial assistance, online support groups, medication/co-pay assistance.  1-800-813-HOPE (4673) ? [facility] Assists [place] cancer  patients and their families through emotional , educational and financial support.  336-427-4357 ? [place] DSS Where to apply for food stamps, Medicaid and utility assistance. 336-342-1394 ? [facility]: Transportation to medical appointments. 336-347-2287 ? Social Security Administration: May apply for disability if have a Stage IV cancer. 336-342-7796 1-800-772-1213 ? [place] Aging, Disability and Transit Services: Assists with nutrition, care and transit needs. 336-349-2343          

## 2019-11-28 NOTE — Progress Notes (Signed)
Carly Wood presents today for D1 Velcade injection. Lab results and vitals reviewed, including Cr 8.6 and K 5.9. Per Dr. Delton Coombes, it is ok to proceed with treatment today.   Infusions tolerated without incident or complaint, see MAR for details. Discharged in satisfactory condition with follow up instructions.

## 2019-11-29 DIAGNOSIS — N186 End stage renal disease: Secondary | ICD-10-CM | POA: Diagnosis not present

## 2019-11-29 DIAGNOSIS — Z992 Dependence on renal dialysis: Secondary | ICD-10-CM | POA: Diagnosis not present

## 2019-12-01 DIAGNOSIS — N186 End stage renal disease: Secondary | ICD-10-CM | POA: Diagnosis not present

## 2019-12-01 DIAGNOSIS — Z992 Dependence on renal dialysis: Secondary | ICD-10-CM | POA: Diagnosis not present

## 2019-12-04 DIAGNOSIS — Z992 Dependence on renal dialysis: Secondary | ICD-10-CM | POA: Diagnosis not present

## 2019-12-04 DIAGNOSIS — E119 Type 2 diabetes mellitus without complications: Secondary | ICD-10-CM | POA: Diagnosis not present

## 2019-12-04 DIAGNOSIS — N186 End stage renal disease: Secondary | ICD-10-CM | POA: Diagnosis not present

## 2019-12-05 ENCOUNTER — Inpatient Hospital Stay (HOSPITAL_COMMUNITY): Payer: Medicare PPO

## 2019-12-05 ENCOUNTER — Encounter (HOSPITAL_COMMUNITY): Payer: Self-pay

## 2019-12-05 VITALS — BP 100/44 | HR 61 | Temp 97.2°F | Resp 18 | Wt 132.7 lb

## 2019-12-05 DIAGNOSIS — D630 Anemia in neoplastic disease: Secondary | ICD-10-CM | POA: Diagnosis not present

## 2019-12-05 DIAGNOSIS — Z5111 Encounter for antineoplastic chemotherapy: Secondary | ICD-10-CM | POA: Diagnosis not present

## 2019-12-05 DIAGNOSIS — C50411 Malignant neoplasm of upper-outer quadrant of right female breast: Secondary | ICD-10-CM | POA: Diagnosis not present

## 2019-12-05 DIAGNOSIS — N184 Chronic kidney disease, stage 4 (severe): Secondary | ICD-10-CM | POA: Diagnosis not present

## 2019-12-05 DIAGNOSIS — C9000 Multiple myeloma not having achieved remission: Secondary | ICD-10-CM

## 2019-12-05 DIAGNOSIS — I129 Hypertensive chronic kidney disease with stage 1 through stage 4 chronic kidney disease, or unspecified chronic kidney disease: Secondary | ICD-10-CM | POA: Diagnosis not present

## 2019-12-05 DIAGNOSIS — Z992 Dependence on renal dialysis: Secondary | ICD-10-CM | POA: Diagnosis not present

## 2019-12-05 DIAGNOSIS — Z9011 Acquired absence of right breast and nipple: Secondary | ICD-10-CM | POA: Diagnosis not present

## 2019-12-05 DIAGNOSIS — Z79811 Long term (current) use of aromatase inhibitors: Secondary | ICD-10-CM | POA: Diagnosis not present

## 2019-12-05 LAB — CBC WITH DIFFERENTIAL/PLATELET
Abs Immature Granulocytes: 0.02 10*3/uL (ref 0.00–0.07)
Basophils Absolute: 0.1 10*3/uL (ref 0.0–0.1)
Basophils Relative: 1 %
Eosinophils Absolute: 0.2 10*3/uL (ref 0.0–0.5)
Eosinophils Relative: 3 %
HCT: 36 % (ref 36.0–46.0)
Hemoglobin: 11 g/dL — ABNORMAL LOW (ref 12.0–15.0)
Immature Granulocytes: 0 %
Lymphocytes Relative: 22 %
Lymphs Abs: 1.5 10*3/uL (ref 0.7–4.0)
MCH: 34.3 pg — ABNORMAL HIGH (ref 26.0–34.0)
MCHC: 30.6 g/dL (ref 30.0–36.0)
MCV: 112.1 fL — ABNORMAL HIGH (ref 80.0–100.0)
Monocytes Absolute: 0.7 10*3/uL (ref 0.1–1.0)
Monocytes Relative: 10 %
Neutro Abs: 4.2 10*3/uL (ref 1.7–7.7)
Neutrophils Relative %: 64 %
Platelets: 267 10*3/uL (ref 150–400)
RBC: 3.21 MIL/uL — ABNORMAL LOW (ref 3.87–5.11)
RDW: 14.9 % (ref 11.5–15.5)
WBC: 6.6 10*3/uL (ref 4.0–10.5)
nRBC: 0.3 % — ABNORMAL HIGH (ref 0.0–0.2)

## 2019-12-05 LAB — COMPREHENSIVE METABOLIC PANEL
ALT: 14 U/L (ref 0–44)
AST: 17 U/L (ref 15–41)
Albumin: 3.6 g/dL (ref 3.5–5.0)
Alkaline Phosphatase: 94 U/L (ref 38–126)
Anion gap: 14 (ref 5–15)
BUN: 55 mg/dL — ABNORMAL HIGH (ref 8–23)
CO2: 29 mmol/L (ref 22–32)
Calcium: 8.6 mg/dL — ABNORMAL LOW (ref 8.9–10.3)
Chloride: 97 mmol/L — ABNORMAL LOW (ref 98–111)
Creatinine, Ser: 8.08 mg/dL — ABNORMAL HIGH (ref 0.44–1.00)
GFR calc Af Amer: 5 mL/min — ABNORMAL LOW (ref >60)
GFR calc non Af Amer: 4 mL/min — ABNORMAL LOW (ref >60)
Glucose, Bld: 260 mg/dL — ABNORMAL HIGH (ref 70–99)
Potassium: 5.6 mmol/L — ABNORMAL HIGH (ref 3.5–5.1)
Sodium: 140 mmol/L (ref 135–145)
Total Bilirubin: 0.5 mg/dL (ref 0.3–1.2)
Total Protein: 6.8 g/dL (ref 6.5–8.1)

## 2019-12-05 MED ORDER — DEXAMETHASONE 4 MG PO TABS
10.0000 mg | ORAL_TABLET | Freq: Once | ORAL | Status: AC
  Administered 2019-12-05: 10 mg via ORAL

## 2019-12-05 MED ORDER — BORTEZOMIB CHEMO SQ INJECTION 3.5 MG (2.5MG/ML)
1.3000 mg/m2 | Freq: Once | INTRAMUSCULAR | Status: AC
  Administered 2019-12-05: 2.25 mg via SUBCUTANEOUS
  Filled 2019-12-05: qty 0.9

## 2019-12-05 MED ORDER — PROCHLORPERAZINE MALEATE 10 MG PO TABS
10.0000 mg | ORAL_TABLET | Freq: Once | ORAL | Status: AC
  Administered 2019-12-05: 10 mg via ORAL

## 2019-12-05 MED ORDER — DEXAMETHASONE 4 MG PO TABS
ORAL_TABLET | ORAL | Status: AC
  Filled 2019-12-05: qty 2

## 2019-12-05 MED ORDER — PROCHLORPERAZINE MALEATE 10 MG PO TABS
ORAL_TABLET | ORAL | Status: AC
  Filled 2019-12-05: qty 1

## 2019-12-05 NOTE — Progress Notes (Signed)
Labs and VS reviewed with Dr. Delton Coombes.  Okay to proceed with tx with Velcade today per MD.   Velcade administered per orders - see MAR for details.  Discharged via wheelchair in stable condition in [facility] staff.

## 2019-12-06 DIAGNOSIS — Z992 Dependence on renal dialysis: Secondary | ICD-10-CM | POA: Diagnosis not present

## 2019-12-06 DIAGNOSIS — N186 End stage renal disease: Secondary | ICD-10-CM | POA: Diagnosis not present

## 2019-12-07 DIAGNOSIS — Z992 Dependence on renal dialysis: Secondary | ICD-10-CM | POA: Diagnosis not present

## 2019-12-07 DIAGNOSIS — N186 End stage renal disease: Secondary | ICD-10-CM | POA: Diagnosis not present

## 2019-12-08 DIAGNOSIS — N186 End stage renal disease: Secondary | ICD-10-CM | POA: Diagnosis not present

## 2019-12-08 DIAGNOSIS — Z992 Dependence on renal dialysis: Secondary | ICD-10-CM | POA: Diagnosis not present

## 2019-12-11 DIAGNOSIS — Z992 Dependence on renal dialysis: Secondary | ICD-10-CM | POA: Diagnosis not present

## 2019-12-11 DIAGNOSIS — N186 End stage renal disease: Secondary | ICD-10-CM | POA: Diagnosis not present

## 2019-12-12 ENCOUNTER — Inpatient Hospital Stay (HOSPITAL_COMMUNITY): Payer: Medicare PPO | Attending: Hematology

## 2019-12-12 ENCOUNTER — Inpatient Hospital Stay (HOSPITAL_COMMUNITY): Payer: Medicare PPO

## 2019-12-12 ENCOUNTER — Encounter (HOSPITAL_COMMUNITY): Payer: Self-pay

## 2019-12-12 VITALS — BP 105/36 | HR 79 | Temp 97.2°F | Resp 16 | Wt 131.0 lb

## 2019-12-12 DIAGNOSIS — C9000 Multiple myeloma not having achieved remission: Secondary | ICD-10-CM | POA: Insufficient documentation

## 2019-12-12 DIAGNOSIS — Z5111 Encounter for antineoplastic chemotherapy: Secondary | ICD-10-CM | POA: Diagnosis not present

## 2019-12-12 DIAGNOSIS — R739 Hyperglycemia, unspecified: Secondary | ICD-10-CM

## 2019-12-12 DIAGNOSIS — E1165 Type 2 diabetes mellitus with hyperglycemia: Secondary | ICD-10-CM | POA: Diagnosis not present

## 2019-12-12 DIAGNOSIS — Z794 Long term (current) use of insulin: Secondary | ICD-10-CM | POA: Insufficient documentation

## 2019-12-12 DIAGNOSIS — E875 Hyperkalemia: Secondary | ICD-10-CM | POA: Insufficient documentation

## 2019-12-12 LAB — CBC WITH DIFFERENTIAL/PLATELET
Abs Immature Granulocytes: 0.02 10*3/uL (ref 0.00–0.07)
Basophils Absolute: 0 10*3/uL (ref 0.0–0.1)
Basophils Relative: 0 %
Eosinophils Absolute: 0.1 10*3/uL (ref 0.0–0.5)
Eosinophils Relative: 2 %
HCT: 35.6 % — ABNORMAL LOW (ref 36.0–46.0)
Hemoglobin: 11.3 g/dL — ABNORMAL LOW (ref 12.0–15.0)
Immature Granulocytes: 0 %
Lymphocytes Relative: 14 %
Lymphs Abs: 1.1 10*3/uL (ref 0.7–4.0)
MCH: 34.9 pg — ABNORMAL HIGH (ref 26.0–34.0)
MCHC: 31.7 g/dL (ref 30.0–36.0)
MCV: 109.9 fL — ABNORMAL HIGH (ref 80.0–100.0)
Monocytes Absolute: 0.6 10*3/uL (ref 0.1–1.0)
Monocytes Relative: 8 %
Neutro Abs: 5.8 10*3/uL (ref 1.7–7.7)
Neutrophils Relative %: 76 %
Platelets: 212 10*3/uL (ref 150–400)
RBC: 3.24 MIL/uL — ABNORMAL LOW (ref 3.87–5.11)
RDW: 15.1 % (ref 11.5–15.5)
WBC: 7.6 10*3/uL (ref 4.0–10.5)
nRBC: 0 % (ref 0.0–0.2)

## 2019-12-12 LAB — COMPREHENSIVE METABOLIC PANEL
ALT: 15 U/L (ref 0–44)
AST: 16 U/L (ref 15–41)
Albumin: 3.5 g/dL (ref 3.5–5.0)
Alkaline Phosphatase: 93 U/L (ref 38–126)
Anion gap: 15 (ref 5–15)
BUN: 67 mg/dL — ABNORMAL HIGH (ref 8–23)
CO2: 26 mmol/L (ref 22–32)
Calcium: 7.9 mg/dL — ABNORMAL LOW (ref 8.9–10.3)
Chloride: 94 mmol/L — ABNORMAL LOW (ref 98–111)
Creatinine, Ser: 8.62 mg/dL — ABNORMAL HIGH (ref 0.44–1.00)
GFR calc non Af Amer: 4 mL/min — ABNORMAL LOW (ref >60)
Glucose, Bld: 602 mg/dL (ref 70–99)
Potassium: 5.6 mmol/L — ABNORMAL HIGH (ref 3.5–5.1)
Sodium: 135 mmol/L (ref 135–145)
Total Bilirubin: 0.5 mg/dL (ref 0.3–1.2)
Total Protein: 6 g/dL — ABNORMAL LOW (ref 6.5–8.1)

## 2019-12-12 LAB — LACTATE DEHYDROGENASE: LDH: 122 U/L (ref 98–192)

## 2019-12-12 MED ORDER — DEXAMETHASONE 4 MG PO TABS: 10.0000 mg | ORAL_TABLET | Freq: Once | ORAL | Status: DC

## 2019-12-12 MED ORDER — INSULIN ASPART 100 UNIT/ML ~~LOC~~ SOLN
10.0000 [IU] | Freq: Once | SUBCUTANEOUS | Status: AC
  Administered 2019-12-12: 10 [IU] via SUBCUTANEOUS
  Filled 2019-12-12: qty 0.1

## 2019-12-12 MED ORDER — PROCHLORPERAZINE MALEATE 10 MG PO TABS
10.0000 mg | ORAL_TABLET | Freq: Once | ORAL | Status: AC
  Administered 2019-12-12: 10 mg via ORAL
  Filled 2019-12-12: qty 1

## 2019-12-12 MED ORDER — BORTEZOMIB CHEMO SQ INJECTION 3.5 MG (2.5MG/ML)
1.3000 mg/m2 | Freq: Once | INTRAMUSCULAR | Status: AC
  Administered 2019-12-12: 2.25 mg via SUBCUTANEOUS
  Filled 2019-12-12: qty 0.9

## 2019-12-12 NOTE — Patient Instructions (Signed)
[facility] Discharge Instructions for Patients Receiving Chemotherapy   Beginning January 23rd 2017 lab work for the Cancer Center will be done in the  Main lab at  on 1st floor. If you have a lab appointment with the Cancer Center please come in thru the  Main Entrance and check in at the main information desk   Today you received the following chemotherapy agents Velcade injection. Follow-up as scheduled  To help prevent nausea and vomiting after your treatment, we encourage you to take your nausea medication   If you develop nausea and vomiting, or diarrhea that is not controlled by your medication, call the clinic.  The clinic phone number is [phone]. Office hours are Monday-Friday 8:30am-5:00pm.  BELOW ARE SYMPTOMS THAT SHOULD BE REPORTED IMMEDIATELY:  *FEVER GREATER THAN 101.0 F  *CHILLS WITH OR WITHOUT FEVER  NAUSEA AND VOMITING THAT IS NOT CONTROLLED WITH YOUR NAUSEA MEDICATION  *UNUSUAL SHORTNESS OF BREATH  *UNUSUAL BRUISING OR BLEEDING  TENDERNESS IN MOUTH AND THROAT WITH OR WITHOUT PRESENCE OF ULCERS  *URINARY PROBLEMS  *BOWEL PROBLEMS  UNUSUAL RASH Items with * indicate a potential emergency and should be followed up as soon as possible. If you have an emergency after office hours please contact your primary care physician or go to the nearest emergency department.  Please call the clinic during office hours if you have any questions or concerns.   You may also contact the Patient Navigator at [phone] should you have any questions or need assistance in obtaining follow up care.      Resources For Cancer Patients and their Caregivers ? American Cancer Society: Can assist with transportation, wigs, general needs, runs Look Good Feel Better.        1-888-227-6333 ? Cancer Care: Provides financial assistance, online support groups, medication/co-pay assistance.  1-800-813-HOPE (4673) ? [facility] Assists [place] cancer patients and their families through emotional , educational and financial support.  336-427-4357 ? [place] DSS Where to apply for food stamps, Medicaid and utility assistance. 336-342-1394 ? [facility]: Transportation to medical appointments. 336-347-2287 ? Social Security Administration: May apply for disability if have a Stage IV cancer. 336-342-7796 1-800-772-1213 ? [place] Aging, Disability and Transit Services: Assists with nutrition, care and transit needs. 336-349-2343         

## 2019-12-12 NOTE — Progress Notes (Signed)
1344 Labs including glucose 602,BUN 67 and Creatinine 8.62, reviewed with Dr. Delton Coombes and pt approved for Velcade injection with Decadron PO to be held and 10 units of Novolog insulin to be given SQ today per MD.                     Carly Wood tolerated Velcade and Novolog injections well without complaints or incident.Notified [phone]) concerning pt's elevated glucose of 602 and Bard Herbert NP for nursing center to follow-up with [name] per Dr. Tomie China request.VSS Pt discharged via wheelchair in stable condition

## 2019-12-13 ENCOUNTER — Encounter: Payer: Self-pay | Admitting: Adult Health

## 2019-12-13 ENCOUNTER — Non-Acute Institutional Stay (SKILLED_NURSING_FACILITY): Payer: Medicare PPO | Admitting: Adult Health

## 2019-12-13 DIAGNOSIS — Z992 Dependence on renal dialysis: Secondary | ICD-10-CM | POA: Diagnosis not present

## 2019-12-13 DIAGNOSIS — C9000 Multiple myeloma not having achieved remission: Secondary | ICD-10-CM | POA: Diagnosis not present

## 2019-12-13 DIAGNOSIS — E1122 Type 2 diabetes mellitus with diabetic chronic kidney disease: Secondary | ICD-10-CM

## 2019-12-13 DIAGNOSIS — N186 End stage renal disease: Secondary | ICD-10-CM | POA: Diagnosis not present

## 2019-12-13 LAB — KAPPA/LAMBDA LIGHT CHAINS
Kappa free light chain: 159.4 mg/L — ABNORMAL HIGH (ref 3.3–19.4)
Kappa, lambda light chain ratio: 1 (ref 0.26–1.65)
Lambda free light chains: 158.7 mg/L — ABNORMAL HIGH (ref 5.7–26.3)

## 2019-12-13 MED ORDER — PEGFILGRASTIM 6 MG/0.6ML ~~LOC~~ PSKT
PREFILLED_SYRINGE | SUBCUTANEOUS | Status: AC
  Filled 2019-12-13: qty 0.6

## 2019-12-13 MED ORDER — INFLUENZA VAC A&B SA ADJ QUAD 0.5 ML IM PRSY
PREFILLED_SYRINGE | INTRAMUSCULAR | Status: AC
  Filled 2019-12-13: qty 0.5

## 2019-12-13 MED ORDER — OCTREOTIDE ACETATE 30 MG IM KIT
PACK | INTRAMUSCULAR | Status: AC
  Filled 2019-12-13: qty 1

## 2019-12-13 NOTE — Progress Notes (Signed)
Location:    [place] Room Number: 148/D Place of Service:  SNF (31)   CODE STATUS: Full Code  Allergies  Allergen Reactions  . Penicillins Other (See Comments)    Unsure of reaction Has patient had a PCN reaction causing immediate rash, facial/tongue/throat swelling, SOB or lightheadedness with hypotension: Unknown Has patient had a PCN reaction causing severe rash involving mucus membranes or skin necrosis: Unknown Has patient had a PCN reaction that required hospitalization: No Has patient had a PCN reaction occurring within the last 10 years: Unknown If all of the above answers are "NO", then may proceed with Cephalosporin use.    . Ace Inhibitors Cough and Other (See Comments)    Tongue swell  . Lisinopril Other (See Comments)    Tongue swell    Chief Complaint  Patient presents with  . Acute Visit    Elevated Glucose    HPI:  She is being followed for her multiple myeloma by the cancer center. She is receiving valcade injections with decadron. She was found at the cancer center yesterday to have cbg of 600. She was given novolog at the center. Unfortunately the snf was not made aware that she is getting decadron. She was started on humalog 10 units with meals. There are no reports of excessive hunger or thirst. There are no reports of agitation present.   Past Medical History:  Diagnosis Date  . Anemia   . Anxiety   . Chronic kidney disease   . Chronic renal disease, stage 4, severely decreased glomerular filtration rate (GFR) between 15-29 mL/min/1.73 square meter (HCC) 08/22/2015  . Complication of anesthesia    delirious after Breast Surgery  . Dementia ([place])    mild  . Depression   . Diabetes mellitus without complication ([place])    type II  . Dyspnea    with activity  . GERD (gastroesophageal reflux disease)   . Glaucoma   . Hypertension   . Pneumonia   . Stage 1 infiltrating ductal carcinoma of right female breast ([place]) 08/21/2015    ER+ PR+ HER 2 neu + (3+) T1cN0     Past Surgical History:  Procedure Laterality Date  . AV FISTULA PLACEMENT Left 11/22/2017   Procedure: ARTERIOVENOUS (AV) FISTULA CREATION LEFT ARM;  Surgeon: Elam Dutch, MD;  Location: [place];  Service: Vascular;  Laterality: Left;  . BIOPSY  08/07/2016   Procedure: BIOPSY;  Surgeon: Daneil Dolin, MD;  [facility];  Service: Endoscopy;;  gastric ulcer biopsy  . COLONOSCOPY    . ESOPHAGOGASTRODUODENOSCOPY N/A 08/07/2016   Procedure: ESOPHAGOGASTRODUODENOSCOPY (EGD);  Surgeon: Daneil Dolin, MD;  [facility];  Service: Endoscopy;  Laterality: N/A;  1215-rescheduled to 6/1 @ 2:30pm per Tretha Sciara  . ESOPHAGOGASTRODUODENOSCOPY N/A 11/27/2016   Procedure: ESOPHAGOGASTRODUODENOSCOPY (EGD);  Surgeon: Daneil Dolin, MD;  [facility];  Service: Endoscopy;  Laterality: N/A;  8:15am  . FISTULA SUPERFICIALIZATION Left 02/14/2018   Procedure: FISTULA SUPERFICIALIZATION LEFT ARM;  Surgeon: Angelia Mould, MD;  Location: [place];  Service: Vascular;  Laterality: Left;  . FRACTURE SURGERY Right    ankle  . MALONEY DILATION N/A 08/07/2016   Procedure: Venia Minks DILATION;  Surgeon: Daneil Dolin, MD;  [facility];  Service: Endoscopy;  Laterality: N/A;  . MASTECTOMY, PARTIAL Right   . PERIPHERAL VASCULAR BALLOON ANGIOPLASTY Left 07/13/2019   Procedure: PERIPHERAL VASCULAR BALLOON ANGIOPLASTY;  Surgeon: Marty Heck, MD;  Location:  [place] IN[facility];  Service: Cardiovascular;  Laterality: Left;  arm fistulogram  . PORT-A-CATH REMOVAL Left 11/22/2017   Procedure: REMOVAL PORT-A-CATH LEFT CHEST;  Surgeon: Elam Dutch, MD;  Location: [facility];  Service: Vascular;  Laterality: Left;  . RETINAL DETACHMENT SURGERY Right     Social History   Socioeconomic History  . Marital status: Single    Spouse name: Not on file  . Number of children: Not on file  . Years of education: Not on file  . Highest education  level: Not on file  Occupational History  . Occupation: retired   Tobacco Use  . Smoking status: Never Smoker  . Smokeless tobacco: Never Used  Vaping Use  . Vaping Use: Never used  Substance and Sexual Activity  . Alcohol use: No    Alcohol/week: 0.0 standard drinks  . Drug use: No  . Sexual activity: Never  Other Topics Concern  . Not on file  Social History Narrative   Long term resident of SNF    Social Determinants of Health   Financial Resource Strain:   . Difficulty of Paying Living Expenses: Not on file  Food Insecurity:   . Worried About Charity fundraiser in the Last Year: Not on file  . Ran Out of Food in the Last Year: Not on file  Transportation Needs:   . Lack of Transportation (Medical): Not on file  . Lack of Transportation (Non-Medical): Not on file  Physical Activity:   . Days of Exercise per Week: Not on file  . Minutes of Exercise per Session: Not on file  Stress:   . Feeling of Stress : Not on file  Social Connections:   . Frequency of Communication with Friends and Family: Not on file  . Frequency of Social Gatherings with Friends and Family: Not on file  . Attends Religious Services: Not on file  . Active Member of Clubs or Organizations: Not on file  . Attends Archivist Meetings: Not on file  . Marital Status: Not on file  Intimate Partner Violence:   . Fear of Current or Ex-Partner: Not on file  . Emotionally Abused: Not on file  . Physically Abused: Not on file  . Sexually Abused: Not on file   Family History  Problem Relation Age of Onset  . Multiple myeloma Sister   . Brain cancer Sister   . Dementia Mother        died at 16  . Stroke Mother   . Heart failure Mother   . Diabetes Mother   . Heart disease Father   . Prostate cancer Brother   . Colon cancer Neg Hx       VITAL SIGNS BP (!) 145/68   Pulse 83   Temp 98.4 F (36.9 C)   Resp 18   Ht 5' 3" (1.6 m)   Wt 137 lb 6.4 oz (62.3 kg)   SpO2 98%   BMI 24.34  kg/m   Outpatient Encounter Medications as of 12/13/2019  Medication Sig  . acetaminophen (TYLENOL) 500 MG tablet Take 500 mg by mouth every 6 (six) hours as needed for moderate pain or headache.   Marland Kitchen acyclovir (ZOVIRAX) 200 MG capsule Take 200 mg by mouth daily.  Marland Kitchen amLODipine (NORVASC) 10 MG tablet Take 10 mg by mouth daily. Not taking on dialysis days  . anastrozole (ARIMIDEX) 1 MG tablet TAKE 1 TABLET BY MOUTH DAILY  . aspirin EC 81 MG tablet Take 81 mg  by mouth daily.  . calcium acetate, Phos Binder, (PHOSLYRA) 667 MG/5ML SOLN Take 667 mg by mouth daily. Take 1 tablet with snacks daily  . calcium acetate, Phos Binder, (PHOSLYRA) 667 MG/5ML SOLN Take 1,334 mg by mouth 2 (two) times daily with a meal.  . Calcium Carbonate-Vitamin D (CALCIUM 600+D PO) Take 1 tablet by mouth daily.  . cloNIDine (CATAPRES) 0.3 MG tablet Take 0.3 mg by mouth 2 (two) times daily.   . insulin aspart (NOVOLOG FLEXPEN) 100 UNIT/ML FlexPen Inject 10 Units into the skin 3 (three) times daily with meals.  . melatonin 3 MG TABS tablet Take 6 mg by mouth at bedtime. For Sleep  . Multiple Vitamin (MULTIVITAMIN) capsule Take 1 capsule by mouth daily.  . NON FORMULARY Diet: _____ Regular, ___x___ NAS, _______Consistent Carbohydrate, _______NPO _____Other  . NON FORMULARY Wanderguard #3310 to ankle for safety awareness. Check placement and function qshift. Every Shift Day, Evening, Night  . Nutritional Supplements (NEPRO PO) Take 1 Can by mouth daily.   No facility-administered encounter medications on file as of 12/13/2019.     SIGNIFICANT DIAGNOSTIC EXAMS   LABS REVIEWED PREVIOUS  10-13-19: hgb a1c 6.0; vit B 12: 1126 folate 20.9 10-17-19: wbc 8.8; hgb 8.3; hct 26.1; mcv 114.0 plt 270; glucose 190; bun 28; creat 4.85; k+ 3.9; na++ 127; ca 8.9 liver normal albumin 4.0  10-31-19: wbc 6.0; hgb 8.8; hct 28.6; mcv 117.2 plt 326; glucose 188; bun 45; creat 6.28; k+ 3.9; na++ 137;ca 9.1 liver normal albumin 3.9 11-07-19:  wbc 6.1; hgb 9.2; hct 29.4 mcv 115.7 plt 276; glucose 201; bun 44; creat 5.93; k+ 4.5; na++ 136; ca 9.1 liver normal albumin 3.8 11-14-19; wbc 8.3; hgb 9.7; hct 31.2 mcv 116.4 plt 261; glucose 198; bun 44; creat 6.66; k+ 5.1; na++ 138; ca 8.8 liver normal albumin 3.9   TODAY  12-12-19: wbc 7.6; hgb 11.3; hct 35.6; mcv 109.9 plt 212; glucose 602; bun 8.62 ;k+ 5.6; na++ 135; ca 7.9 liver normal albumin 3.5   Review of Systems  Constitutional: Negative for malaise/fatigue.  Respiratory: Negative for cough and shortness of breath.   Cardiovascular: Negative for chest pain, palpitations and leg swelling.  Gastrointestinal: Negative for abdominal pain, constipation and heartburn.  Musculoskeletal: Negative for back pain, joint pain and myalgias.  Skin: Negative.   Neurological: Negative for dizziness.  Psychiatric/Behavioral: The patient is not nervous/anxious.      Physical Exam Constitutional:      General: She is not in acute distress.    Appearance: She is well-developed. She is not diaphoretic.  Neck:     Thyroid: No thyromegaly.  Cardiovascular:     Rate and Rhythm: Normal rate and regular rhythm.     Heart sounds: Normal heart sounds.     Comments: Pedal pulses faint  Pulmonary:     Effort: Pulmonary effort is normal. No respiratory distress.     Breath sounds: Normal breath sounds.  Abdominal:     General: Bowel sounds are normal. There is no distension.     Palpations: Abdomen is soft.     Tenderness: There is no abdominal tenderness.  Musculoskeletal:        General: Normal range of motion.     Cervical back: Neck supple.     Right lower leg: No edema.     Left lower leg: No edema.  Lymphadenopathy:     Cervical: No cervical adenopathy.  Skin:    General: Skin is warm and dry.  Comments: Left arm a/v fistula: + thrill + bruit   Neurological:     Mental Status: She is alert.  Psychiatric:        Mood and Affect: Mood normal.      ASSESSMENT/  PLAN:  TODAY  1. Multiple myeloma not having achieved remission 2. Diabetes mellitus with ESRD (end stage renal disease)  cbg's are worse Will continue humalog 10 units with meals Will continue to monitor her status.    MD is aware of resident's narcotic use and is in agreement with current plan of care. We will attempt to wean resident as appropriate.  Ok Edwards NP [facility] Adult Medicine  Contact [phone] Monday through Friday 8am- 5pm  After hours call [phone]

## 2019-12-14 LAB — PROTEIN ELECTROPHORESIS, SERUM
A/G Ratio: 1.7 (ref 0.7–1.7)
Albumin ELP: 3.7 g/dL (ref 2.9–4.4)
Alpha-1-Globulin: 0.2 g/dL (ref 0.0–0.4)
Alpha-2-Globulin: 0.8 g/dL (ref 0.4–1.0)
Beta Globulin: 0.7 g/dL (ref 0.7–1.3)
Gamma Globulin: 0.6 g/dL (ref 0.4–1.8)
Globulin, Total: 2.2 g/dL (ref 2.2–3.9)
Total Protein ELP: 5.9 g/dL — ABNORMAL LOW (ref 6.0–8.5)

## 2019-12-14 MED ORDER — PEGFILGRASTIM 6 MG/0.6ML ~~LOC~~ PSKT
PREFILLED_SYRINGE | SUBCUTANEOUS | Status: AC
  Filled 2019-12-14: qty 0.6

## 2019-12-14 MED ORDER — EPOETIN ALFA-EPBX 40000 UNIT/ML IJ SOLN
INTRAMUSCULAR | Status: AC
  Filled 2019-12-14: qty 1

## 2019-12-15 DIAGNOSIS — Z992 Dependence on renal dialysis: Secondary | ICD-10-CM | POA: Diagnosis not present

## 2019-12-15 DIAGNOSIS — N186 End stage renal disease: Secondary | ICD-10-CM | POA: Diagnosis not present

## 2019-12-15 MED ORDER — DENOSUMAB 120 MG/1.7ML ~~LOC~~ SOLN
SUBCUTANEOUS | Status: AC
  Filled 2019-12-15: qty 1.7

## 2019-12-18 DIAGNOSIS — Z992 Dependence on renal dialysis: Secondary | ICD-10-CM | POA: Diagnosis not present

## 2019-12-18 DIAGNOSIS — N186 End stage renal disease: Secondary | ICD-10-CM | POA: Diagnosis not present

## 2019-12-20 ENCOUNTER — Other Ambulatory Visit (HOSPITAL_COMMUNITY)
Admission: RE | Admit: 2019-12-20 | Discharge: 2019-12-20 | Disposition: A | Discharge status: Home / Self Care | Payer: Medicare PPO | Source: Skilled Nursing Facility | Attending: Adult Health | Admitting: Adult Health

## 2019-12-20 DIAGNOSIS — E119 Type 2 diabetes mellitus without complications: Secondary | ICD-10-CM | POA: Diagnosis not present

## 2019-12-20 DIAGNOSIS — Z992 Dependence on renal dialysis: Secondary | ICD-10-CM | POA: Diagnosis not present

## 2019-12-20 DIAGNOSIS — N186 End stage renal disease: Secondary | ICD-10-CM | POA: Diagnosis not present

## 2019-12-21 LAB — HEMOGLOBIN A1C
Hgb A1c MFr Bld: 8 % — ABNORMAL HIGH (ref 4.8–5.6)
Mean Plasma Glucose: 183 mg/dL

## 2019-12-22 ENCOUNTER — Non-Acute Institutional Stay (SKILLED_NURSING_FACILITY): Payer: Medicare PPO | Admitting: Adult Health

## 2019-12-22 ENCOUNTER — Encounter: Payer: Self-pay | Admitting: Adult Health

## 2019-12-22 DIAGNOSIS — E875 Hyperkalemia: Secondary | ICD-10-CM | POA: Diagnosis not present

## 2019-12-22 DIAGNOSIS — N186 End stage renal disease: Secondary | ICD-10-CM | POA: Diagnosis not present

## 2019-12-22 DIAGNOSIS — I120 Hypertensive chronic kidney disease with stage 5 chronic kidney disease or end stage renal disease: Secondary | ICD-10-CM | POA: Diagnosis not present

## 2019-12-22 DIAGNOSIS — E1122 Type 2 diabetes mellitus with diabetic chronic kidney disease: Secondary | ICD-10-CM | POA: Diagnosis not present

## 2019-12-22 DIAGNOSIS — Z992 Dependence on renal dialysis: Secondary | ICD-10-CM

## 2019-12-22 NOTE — Progress Notes (Signed)
Location:    [place] Room Number: 156/D Place of Service:  SNF (31)   CODE STATUS: Full Code  Allergies  Allergen Reactions  . Penicillins Other (See Comments)    Unsure of reaction Has patient had a PCN reaction causing immediate rash, facial/tongue/throat swelling, SOB or lightheadedness with hypotension: Unknown Has patient had a PCN reaction causing severe rash involving mucus membranes or skin necrosis: Unknown Has patient had a PCN reaction that required hospitalization: No Has patient had a PCN reaction occurring within the last 10 years: Unknown If all of the above answers are "NO", then may proceed with Cephalosporin use.    . Ace Inhibitors Cough and Other (See Comments)    Tongue swell  . Lisinopril Other (See Comments)    Tongue swell    Chief Complaint  Patient presents with  . Medical Management of Chronic Issues             Hyperkalemia:   Chronic kidney disease with end stage renal disease on dialysis due to type 2 diabetes mellitus/dependence on dialysis;  Hypertension; due to end stage renal disease due to type 2 diabetes mellitus:    HPI:  She is a 84 year old long term resident of this facility being seen for the management of her chronic illnesses: Hyperkalemia:   Chronic kidney disease with end stage renal disease on dialysis due to type 2 diabetes mellitus/dependence on dialysis;  Hypertension; due to end stage renal disease due to type 2 diabetes mellitus. There are no reports of uncontrolled pain. No reports of agitation or wandering. No reports of constipation or diarrhea.   Past Medical History:  Diagnosis Date  . Anemia   . Anxiety   . Chronic kidney disease   . Chronic renal disease, stage 4, severely decreased glomerular filtration rate (GFR) between 15-29 mL/min/1.73 square meter (HCC) 08/22/2015  . Complication of anesthesia    delirious after Breast Surgery  . Dementia ([place])    mild  . Depression   . Diabetes  mellitus without complication ([place])    type II  . Dyspnea    with activity  . GERD (gastroesophageal reflux disease)   . Glaucoma   . Hypertension   . Pneumonia   . Stage 1 infiltrating ductal carcinoma of right female breast ([place]) 08/21/2015   ER+ PR+ HER 2 neu + (3+) T1cN0     Past Surgical History:  Procedure Laterality Date  . AV FISTULA PLACEMENT Left 11/22/2017   Procedure: ARTERIOVENOUS (AV) FISTULA CREATION LEFT ARM;  Surgeon: Elam Dutch, MD;  Location: [place];  Service: Vascular;  Laterality: Left;  . BIOPSY  08/07/2016   Procedure: BIOPSY;  Surgeon: Daneil Dolin, MD;  [facility];  Service: Endoscopy;;  gastric ulcer biopsy  . COLONOSCOPY    . ESOPHAGOGASTRODUODENOSCOPY N/A 08/07/2016   Procedure: ESOPHAGOGASTRODUODENOSCOPY (EGD);  Surgeon: Daneil Dolin, MD;  [facility];  Service: Endoscopy;  Laterality: N/A;  1215-rescheduled to 6/1 @ 2:30pm per Tretha Sciara  . ESOPHAGOGASTRODUODENOSCOPY N/A 11/27/2016   Procedure: ESOPHAGOGASTRODUODENOSCOPY (EGD);  Surgeon: Daneil Dolin, MD;  [facility];  Service: Endoscopy;  Laterality: N/A;  8:15am  . FISTULA SUPERFICIALIZATION Left 02/14/2018   Procedure: FISTULA SUPERFICIALIZATION LEFT ARM;  Surgeon: Angelia Mould, MD;  Location: [place];  Service: Vascular;  Laterality: Left;  . FRACTURE SURGERY Right    ankle  . MALONEY DILATION N/A 08/07/2016   Procedure: MALONEY DILATION;  Surgeon:  Rourk, Cristopher Estimable, MD;  [facility];  Service: Endoscopy;  Laterality: N/A;  . MASTECTOMY, PARTIAL Right   . PERIPHERAL VASCULAR BALLOON ANGIOPLASTY Left 07/13/2019   Procedure: PERIPHERAL VASCULAR BALLOON ANGIOPLASTY;  Surgeon: Marty Heck, MD;  Location: [place] CV LAB;  Service: Cardiovascular;  Laterality: Left;  arm fistulogram  . PORT-A-CATH REMOVAL Left 11/22/2017   Procedure: REMOVAL PORT-A-CATH LEFT CHEST;  Surgeon: Elam Dutch, MD;  Location: [facility] OR;  Service: Vascular;   Laterality: Left;  . RETINAL DETACHMENT SURGERY Right     Social History   Socioeconomic History  . Marital status: Single    Spouse name: Not on file  . Number of children: Not on file  . Years of education: Not on file  . Highest education level: Not on file  Occupational History  . Occupation: retired   Tobacco Use  . Smoking status: Never Smoker  . Smokeless tobacco: Never Used  Vaping Use  . Vaping Use: Never used  Substance and Sexual Activity  . Alcohol use: No    Alcohol/week: 0.0 standard drinks  . Drug use: No  . Sexual activity: Never  Other Topics Concern  . Not on file  Social History Narrative   Long term resident of SNF    Social Determinants of Health   Financial Resource Strain:   . Difficulty of Paying Living Expenses: Not on file  Food Insecurity:   . Worried About Charity fundraiser in the Last Year: Not on file  . Ran Out of Food in the Last Year: Not on file  Transportation Needs:   . Lack of Transportation (Medical): Not on file  . Lack of Transportation (Non-Medical): Not on file  Physical Activity:   . Days of Exercise per Week: Not on file  . Minutes of Exercise per Session: Not on file  Stress:   . Feeling of Stress : Not on file  Social Connections:   . Frequency of Communication with Friends and Family: Not on file  . Frequency of Social Gatherings with Friends and Family: Not on file  . Attends Religious Services: Not on file  . Active Member of Clubs or Organizations: Not on file  . Attends Archivist Meetings: Not on file  . Marital Status: Not on file  Intimate Partner Violence:   . Fear of Current or Ex-Partner: Not on file  . Emotionally Abused: Not on file  . Physically Abused: Not on file  . Sexually Abused: Not on file   Family History  Problem Relation Age of Onset  . Multiple myeloma Sister   . Brain cancer Sister   . Dementia Mother        died at 67  . Stroke Mother   . Heart failure Mother   .  Diabetes Mother   . Heart disease Father   . Prostate cancer Brother   . Colon cancer Neg Hx       VITAL SIGNS BP 131/66   Pulse 90   Temp (!) 97.2 F (36.2 C)   Resp 18   Ht '5\' 3"'  (1.6 m)   Wt 137 lb 6.4 oz (62.3 kg)   SpO2 98%   BMI 24.34 kg/m   Outpatient Encounter Medications as of 12/22/2019  Medication Sig  . acetaminophen (TYLENOL) 500 MG tablet Take 500 mg by mouth every 6 (six) hours as needed for moderate pain or headache.   Marland Kitchen acyclovir (ZOVIRAX) 200 MG capsule Take 200 mg  by mouth daily.  Marland Kitchen amLODipine (NORVASC) 10 MG tablet Take 10 mg by mouth daily. Not taking on dialysis days  . anastrozole (ARIMIDEX) 1 MG tablet TAKE 1 TABLET BY MOUTH DAILY  . aspirin EC 81 MG tablet Take 81 mg by mouth daily.  . calcium acetate, Phos Binder, (PHOSLYRA) 667 MG/5ML SOLN Take 667 mg by mouth daily. Take 1 tablet with snacks daily  . calcium acetate, Phos Binder, (PHOSLYRA) 667 MG/5ML SOLN Take 1,334 mg by mouth 2 (two) times daily with a meal.  . Calcium Carbonate-Vitamin D (CALCIUM 600+D PO) Take 1 tablet by mouth daily.  . cloNIDine (CATAPRES) 0.3 MG tablet Take 0.3 mg by mouth 2 (two) times daily.   . insulin aspart (NOVOLOG FLEXPEN) 100 UNIT/ML FlexPen Inject 10 Units into the skin 3 (three) times daily with meals.  . melatonin 3 MG TABS tablet Take 6 mg by mouth at bedtime. For Sleep  . Multiple Vitamin (MULTIVITAMIN) capsule Take 1 capsule by mouth daily.  . NON FORMULARY Diet: _____ Regular, ___x___ NAS, _______Consistent Carbohydrate, _______NPO __x___Other Low Potassium  . NON FORMULARY Wanderguard #3310 to ankle for safety awareness. Check placement and function qshift. Every Shift Day, Evening, Night  . Nutritional Supplements (NEPRO PO) Take 1 Can by mouth daily.   No facility-administered encounter medications on file as of 12/22/2019.     SIGNIFICANT DIAGNOSTIC EXAMS  LABS REVIEWED PREVIOUS  10-13-19: hgb a1c 6.0; vit B 12: 1126 folate 20.9 10-17-19: wbc  8.8; hgb 8.3; hct 26.1; mcv 114.0 plt 270; glucose 190; bun 28; creat 4.85; k+ 3.9; na++ 127; ca 8.9 liver normal albumin 4.0  10-31-19: wbc 6.0; hgb 8.8; hct 28.6; mcv 117.2 plt 326; glucose 188; bun 45; creat 6.28; k+ 3.9; na++ 137;ca 9.1 liver normal albumin 3.9 11-07-19: wbc 6.1; hgb 9.2; hct 29.4 mcv 115.7 plt 276; glucose 201; bun 44; creat 5.93; k+ 4.5; na++ 136; ca 9.1 liver normal albumin 3.8 11-14-19; wbc 8.3; hgb 9.7; hct 31.2 mcv 116.4 plt 261; glucose 198; bun 44; creat 6.66; k+ 5.1; na++ 138; ca 8.8 liver normal albumin 3.9    TODAY  12-12-19: wbc 7.6; hgb 11.3; hct 35.6; mcv 109.9 plt 212; glucose 602; bun 67; creat 8.62; k+ 5.6; na++ 135; ca 7.9 liver normal albumin 3.5   Review of Systems  Constitutional: Negative for malaise/fatigue.  Respiratory: Negative for cough and shortness of breath.   Cardiovascular: Negative for chest pain, palpitations and leg swelling.  Gastrointestinal: Negative for abdominal pain, constipation and heartburn.  Musculoskeletal: Negative for back pain, joint pain and myalgias.  Skin: Negative.   Neurological: Negative for dizziness.  Psychiatric/Behavioral: The patient is not nervous/anxious.    .   Physical Exam Constitutional:      General: She is not in acute distress.    Appearance: She is well-developed. She is not diaphoretic.  Neck:     Thyroid: No thyromegaly.  Cardiovascular:     Rate and Rhythm: Normal rate and regular rhythm.     Heart sounds: Normal heart sounds.     Comments: Pedal pulses faint  Pulmonary:     Effort: Pulmonary effort is normal. No respiratory distress.     Breath sounds: Normal breath sounds.  Abdominal:     General: Bowel sounds are normal. There is no distension.     Palpations: Abdomen is soft.     Tenderness: There is no abdominal tenderness.  Musculoskeletal:        General: Normal range of motion.  Cervical back: Neck supple.     Right lower leg: No edema.     Left lower leg: No edema.    Lymphadenopathy:     Cervical: No cervical adenopathy.  Skin:    General: Skin is warm and dry.     Comments: Left arm a/v fistula: + thrill + bruit  Neurological:     Mental Status: She is alert. Mental status is at baseline.  Psychiatric:        Mood and Affect: Mood normal.     ASSESSMENT/ PLAN:  TODAY  1. Hyperkalemia: k+ 5.6 has been elevated at dialysis at 6.8 for the past 2 weeks. Will change her diet to low potassium   2. Chronic kidney disease with end stage renal disease on dialysis due to type 2 diabetes mellitus/dependence on dialysis; will continue dialysis three times weekly will continue phoshyra 667 mg 2 tabs with meals one with snacks.   3. Hypertension; due to end stage renal disease due to type 2 diabetes mellitus: is stable b/p 131/66 will continue norvasc 10 mg four times weekly and clonidine 0.3 mg twice daily asa 81 mg daily   PREVIOUS   4. Stage 1 infiltrating ductal carcinoma of right female breast is stable is followed by oncology will continue armidex 1 mg daily   5. Multiple myeloma not achieving remission: is without change is followed by oncology  6. Anemia due to end stage renal disease is stable hgb 11.3 will monitor   7. Herpes: no reports of outbreaks will continue acyclovir 200 mg twice daily   8. Vascular dementia with behavioral disturbance: is without change weight is 137 pounds will monitor   9. Diabetes with end stage renal disease (ESRD) is stable hgb a1c 8.0 will continue novolog 10 units with meals will monitor her status.         MD is aware of resident's narcotic use and is in agreement with current plan of care. We will attempt to wean resident as appropriate.  Ok Edwards NP [facility] Adult Medicine  Contact [phone] Monday through Friday 8am- 5pm  After hours call [phone]

## 2019-12-25 DIAGNOSIS — Z992 Dependence on renal dialysis: Secondary | ICD-10-CM | POA: Diagnosis not present

## 2019-12-25 DIAGNOSIS — E875 Hyperkalemia: Secondary | ICD-10-CM | POA: Insufficient documentation

## 2019-12-25 DIAGNOSIS — N186 End stage renal disease: Secondary | ICD-10-CM | POA: Diagnosis not present

## 2019-12-26 ENCOUNTER — Inpatient Hospital Stay (HOSPITAL_COMMUNITY): Payer: Medicare PPO

## 2019-12-26 ENCOUNTER — Encounter (HOSPITAL_COMMUNITY): Payer: Self-pay

## 2019-12-26 VITALS — BP 106/54 | HR 78 | Temp 97.2°F | Resp 18 | Wt 133.1 lb

## 2019-12-26 DIAGNOSIS — E1165 Type 2 diabetes mellitus with hyperglycemia: Secondary | ICD-10-CM | POA: Diagnosis not present

## 2019-12-26 DIAGNOSIS — E875 Hyperkalemia: Secondary | ICD-10-CM

## 2019-12-26 DIAGNOSIS — C9000 Multiple myeloma not having achieved remission: Secondary | ICD-10-CM

## 2019-12-26 DIAGNOSIS — Z5111 Encounter for antineoplastic chemotherapy: Secondary | ICD-10-CM | POA: Diagnosis not present

## 2019-12-26 DIAGNOSIS — Z794 Long term (current) use of insulin: Secondary | ICD-10-CM | POA: Diagnosis not present

## 2019-12-26 LAB — CBC WITH DIFFERENTIAL/PLATELET
Abs Immature Granulocytes: 0.03 10*3/uL (ref 0.00–0.07)
Basophils Absolute: 0 10*3/uL (ref 0.0–0.1)
Basophils Relative: 1 %
Eosinophils Absolute: 0 10*3/uL (ref 0.0–0.5)
Eosinophils Relative: 1 %
HCT: 38.1 % (ref 36.0–46.0)
Hemoglobin: 11.9 g/dL — ABNORMAL LOW (ref 12.0–15.0)
Immature Granulocytes: 0 %
Lymphocytes Relative: 20 %
Lymphs Abs: 1.4 10*3/uL (ref 0.7–4.0)
MCH: 33.4 pg (ref 26.0–34.0)
MCHC: 31.2 g/dL (ref 30.0–36.0)
MCV: 107 fL — ABNORMAL HIGH (ref 80.0–100.0)
Monocytes Absolute: 1.1 10*3/uL — ABNORMAL HIGH (ref 0.1–1.0)
Monocytes Relative: 15 %
Neutro Abs: 4.4 10*3/uL (ref 1.7–7.7)
Neutrophils Relative %: 63 %
Platelets: 302 10*3/uL (ref 150–400)
RBC: 3.56 MIL/uL — ABNORMAL LOW (ref 3.87–5.11)
RDW: 14.9 % (ref 11.5–15.5)
WBC: 7 10*3/uL (ref 4.0–10.5)
nRBC: 0 % (ref 0.0–0.2)

## 2019-12-26 LAB — COMPREHENSIVE METABOLIC PANEL
ALT: 11 U/L (ref 0–44)
AST: 16 U/L (ref 15–41)
Albumin: 3.5 g/dL (ref 3.5–5.0)
Alkaline Phosphatase: 86 U/L (ref 38–126)
Anion gap: 19 — ABNORMAL HIGH (ref 5–15)
BUN: 70 mg/dL — ABNORMAL HIGH (ref 8–23)
CO2: 26 mmol/L (ref 22–32)
Calcium: 8.3 mg/dL — ABNORMAL LOW (ref 8.9–10.3)
Chloride: 91 mmol/L — ABNORMAL LOW (ref 98–111)
Creatinine, Ser: 9.81 mg/dL — ABNORMAL HIGH (ref 0.44–1.00)
GFR, Estimated: 3 mL/min — ABNORMAL LOW (ref >60)
Glucose, Bld: 319 mg/dL — ABNORMAL HIGH (ref 70–99)
Potassium: 6.5 mmol/L (ref 3.5–5.1)
Sodium: 136 mmol/L (ref 135–145)
Total Bilirubin: 0.6 mg/dL (ref 0.3–1.2)
Total Protein: 6.8 g/dL (ref 6.5–8.1)

## 2019-12-26 MED ORDER — DEXAMETHASONE 4 MG PO TABS
10.0000 mg | ORAL_TABLET | Freq: Once | ORAL | Status: AC
  Administered 2019-12-26: 10 mg via ORAL
  Filled 2019-12-26: qty 3

## 2019-12-26 MED ORDER — INSULIN ASPART 100 UNIT/ML ~~LOC~~ SOLN
10.0000 [IU] | Freq: Once | SUBCUTANEOUS | Status: AC
  Administered 2019-12-26: 10 [IU] via INTRAVENOUS
  Filled 2019-12-26: qty 0.1

## 2019-12-26 MED ORDER — SODIUM POLYSTYRENE SULFONATE 15 GM/60ML PO SUSP
60.0000 g | Freq: Once | ORAL | Status: AC
  Administered 2019-12-26: 60 g via ORAL
  Filled 2019-12-26: qty 240

## 2019-12-26 MED ORDER — DEXTROSE 50 % IV SOLN
1.0000 | Freq: Once | INTRAVENOUS | Status: AC
  Administered 2019-12-26: 50 mL via INTRAVENOUS
  Filled 2019-12-26: qty 50

## 2019-12-26 MED ORDER — SODIUM CHLORIDE 0.9 % IV SOLN: INTRAVENOUS | Status: DC

## 2019-12-26 MED ORDER — BORTEZOMIB CHEMO SQ INJECTION 3.5 MG (2.5MG/ML)
1.3000 mg/m2 | Freq: Once | INTRAMUSCULAR | Status: AC
  Administered 2019-12-26: 2.25 mg via SUBCUTANEOUS
  Filled 2019-12-26: qty 0.9

## 2019-12-26 MED ORDER — PROCHLORPERAZINE MALEATE 10 MG PO TABS
10.0000 mg | ORAL_TABLET | Freq: Once | ORAL | Status: AC
  Administered 2019-12-26: 10 mg via ORAL
  Filled 2019-12-26: qty 1

## 2019-12-26 NOTE — Progress Notes (Signed)
[place] reviewed with Dr. Delton Coombes and pt approved for Velcade injection today with 10 units of Novolog insulin IV, 1 amp of D50 IV and Kayexalate 60 grams PO ordered as well for Potassium of 6.5 today per MD.                                                                                  Carly Wood tolerated Velcade injection and other medications given well without complaints or incident. Peripheral IV checked with positive blood return noted prior to and after D50 and Novolog insulin given.Report verbally given to pt's nurse from [facility] today regarding what medications were given with understanding verbalized. VSS upon discharge. Pt discharged via wheelchair in satisfactory condition

## 2019-12-26 NOTE — Patient Instructions (Signed)
[facility] Discharge Instructions for Patients Receiving Chemotherapy   Beginning January 23rd 2017 lab work for the Cancer Center will be done in the  Main lab at [facility] on 1st floor. If you have a lab appointment with the Cancer Center please come in thru the  Main Entrance and check in at the main information desk   Today you received the following chemotherapy agents Velcade. Follow-up as scheduled  To help prevent nausea and vomiting after your treatment, we encourage you to take your nausea medication   If you develop nausea and vomiting, or diarrhea that is not controlled by your medication, call the clinic.  The clinic phone number is [phone]. Office hours are Monday-Friday 8:30am-5:00pm.  BELOW ARE SYMPTOMS THAT SHOULD BE REPORTED IMMEDIATELY:  *FEVER GREATER THAN 101.0 F  *CHILLS WITH OR WITHOUT FEVER  NAUSEA AND VOMITING THAT IS NOT CONTROLLED WITH YOUR NAUSEA MEDICATION  *UNUSUAL SHORTNESS OF BREATH  *UNUSUAL BRUISING OR BLEEDING  TENDERNESS IN MOUTH AND THROAT WITH OR WITHOUT PRESENCE OF ULCERS  *URINARY PROBLEMS  *BOWEL PROBLEMS  UNUSUAL RASH Items with * indicate a potential emergency and should be followed up as soon as possible. If you have an emergency after office hours please contact your primary care physician or go to the nearest emergency department.  Please call the clinic during office hours if you have any questions or concerns.   You may also contact the Patient Navigator at [phone] should you have any questions or need assistance in obtaining follow up care.      Resources For Cancer Patients and their Caregivers ? American Cancer Society: Can assist with transportation, wigs, general needs, runs Look Good Feel Better.        1-888-227-6333 ? Cancer Care: Provides financial assistance, online support groups, medication/co-pay assistance.  1-800-813-HOPE (4673) ? [facility] Assists [place] cancer patients and their families through emotional , educational and financial support.  336-427-4357 ? [place] DSS Where to apply for food stamps, Medicaid and utility assistance. 336-342-1394 ? [facility]: Transportation to medical appointments. 336-347-2287 ? Social Security Administration: May apply for disability if have a Stage IV cancer. 336-342-7796 1-800-772-1213 ? [place] Aging, Disability and Transit Services: Assists with nutrition, care and transit needs. 336-349-2343         

## 2019-12-26 NOTE — Progress Notes (Signed)
CRITICAL VALUE STICKER  CRITICAL VALUE: K 6.5  RECEIVER (on-site recipient of call): M. Dishmon, RN  DATE & TIME NOTIFIED: 12/26/2019 1107  MD NOTIFIED: Delton Coombes, MD  TIME OF NOTIFICATION: 12/26/2019 1115  RESPONSE: Order received and given to primary RN

## 2019-12-27 ENCOUNTER — Other Ambulatory Visit (HOSPITAL_COMMUNITY)
Admission: RE | Admit: 2019-12-27 | Discharge: 2019-12-27 | Disposition: A | Discharge status: Home / Self Care | Payer: Medicare PPO | Source: Skilled Nursing Facility | Attending: Adult Health | Admitting: Adult Health

## 2019-12-27 ENCOUNTER — Non-Acute Institutional Stay (SKILLED_NURSING_FACILITY): Payer: Medicare PPO | Admitting: Adult Health

## 2019-12-27 ENCOUNTER — Encounter: Payer: Self-pay | Admitting: Adult Health

## 2019-12-27 DIAGNOSIS — N186 End stage renal disease: Secondary | ICD-10-CM | POA: Insufficient documentation

## 2019-12-27 DIAGNOSIS — Z992 Dependence on renal dialysis: Secondary | ICD-10-CM | POA: Diagnosis not present

## 2019-12-27 DIAGNOSIS — E1122 Type 2 diabetes mellitus with diabetic chronic kidney disease: Secondary | ICD-10-CM

## 2019-12-27 LAB — BASIC METABOLIC PANEL
Anion gap: 20 — ABNORMAL HIGH (ref 5–15)
BUN: 89 mg/dL — ABNORMAL HIGH (ref 8–23)
CO2: 27 mmol/L (ref 22–32)
Calcium: 8.3 mg/dL — ABNORMAL LOW (ref 8.9–10.3)
Chloride: 90 mmol/L — ABNORMAL LOW (ref 98–111)
Creatinine, Ser: 11.61 mg/dL — ABNORMAL HIGH (ref 0.44–1.00)
GFR, Estimated: 3 mL/min — ABNORMAL LOW (ref >60)
Glucose, Bld: 220 mg/dL — ABNORMAL HIGH (ref 70–99)
Potassium: 4.8 mmol/L (ref 3.5–5.1)
Sodium: 137 mmol/L (ref 135–145)

## 2019-12-27 NOTE — Progress Notes (Signed)
Location:    [facility] Room Number: 148/D Place of Service:  SNF (31)   CODE STATUS: Full Code  Allergies  Allergen Reactions  . Penicillins Other (See Comments)    Unsure of reaction Has patient had a PCN reaction causing immediate rash, facial/tongue/throat swelling, SOB or lightheadedness with hypotension: Unknown Has patient had a PCN reaction causing severe rash involving mucus membranes or skin necrosis: Unknown Has patient had a PCN reaction that required hospitalization: No Has patient had a PCN reaction occurring within the last 10 years: Unknown If all of the above answers are "NO", then may proceed with Cephalosporin use.    . Ace Inhibitors Cough and Other (See Comments)    Tongue swell  . Lisinopril Other (See Comments)    Tongue swell    Chief Complaint  Patient presents with  . Acute Visit    Diabetes    HPI:  Al of her cbg readings are elevated. She is taking humalog 10 units with meals. She is tolerating this without difficulty. She denies any excessive hunger; thirst or anxiety.   Past Medical History:  Diagnosis Date  . Anemia   . Anxiety   . Chronic kidney disease   . Chronic renal disease, stage 4, severely decreased glomerular filtration rate (GFR) between 15-29 mL/min/1.73 square meter (HCC) 08/22/2015  . Complication of anesthesia    delirious after Breast Surgery  . Dementia (HCC)    mild  . Depression   . Diabetes mellitus without complication (HCC)    type II  . Dyspnea    with activity  . GERD (gastroesophageal reflux disease)   . Glaucoma   . Hypertension   . Pneumonia   . Stage 1 infiltrating ductal carcinoma of right female breast (HCC) 08/21/2015   ER+ PR+ HER 2 neu + (3+) T1cN0     Past Surgical History:  Procedure Laterality Date  . AV FISTULA PLACEMENT Left 11/22/2017   Procedure: ARTERIOVENOUS (AV) FISTULA CREATION LEFT ARM;  Surgeon: Sherren Kerns, MD;  Location: [facility] OR;  Service: Vascular;   Laterality: Left;  . BIOPSY  08/07/2016   Procedure: BIOPSY;  Surgeon: Corbin Ade, MD;  [facility];  Service: Endoscopy;;  gastric ulcer biopsy  . COLONOSCOPY    . ESOPHAGOGASTRODUODENOSCOPY N/A 08/07/2016   Procedure: ESOPHAGOGASTRODUODENOSCOPY (EGD);  Surgeon: Corbin Ade, MD;  [facility];  Service: Endoscopy;  Laterality: N/A;  1215-rescheduled to 6/1 @ 2:30pm per Darlina Rumpf  . ESOPHAGOGASTRODUODENOSCOPY N/A 11/27/2016   Procedure: ESOPHAGOGASTRODUODENOSCOPY (EGD);  Surgeon: Corbin Ade, MD;  [facility];  Service: Endoscopy;  Laterality: N/A;  8:15am  . FISTULA SUPERFICIALIZATION Left 02/14/2018   Procedure: FISTULA SUPERFICIALIZATION LEFT ARM;  Surgeon: Chuck Hint, MD;  Location: [facility] OR;  Service: Vascular;  Laterality: Left;  . FRACTURE SURGERY Right    ankle  . MALONEY DILATION N/A 08/07/2016   Procedure: Elease Hashimoto DILATION;  Surgeon: Corbin Ade, MD;  [facility];  Service: Endoscopy;  Laterality: N/A;  . MASTECTOMY, PARTIAL Right   . PERIPHERAL VASCULAR BALLOON ANGIOPLASTY Left 07/13/2019   Procedure: PERIPHERAL VASCULAR BALLOON ANGIOPLASTY;  Surgeon: Cephus Shelling, MD;  [facility]: [facility] LAB;  Service: Cardiovascular;  Laterality: Left;  arm fistulogram  . PORT-A-CATH REMOVAL Left 11/22/2017   Procedure: REMOVAL PORT-A-CATH LEFT CHEST;  Surgeon: Sherren Kerns, MD;  Location: [facility] OR;  Service: Vascular;  Laterality: Left;  . RETINAL DETACHMENT SURGERY Right  Social History   Socioeconomic History  . Marital status: Single    Spouse name: Not on file  . Number of children: Not on file  . Years of education: Not on file  . Highest education level: Not on file  Occupational History  . Occupation: retired   Tobacco Use  . Smoking status: Never Smoker  . Smokeless tobacco: Never Used  Vaping Use  . Vaping Use: Never used  Substance and Sexual Activity  . Alcohol use: No    Alcohol/week: 0.0  standard drinks  . Drug use: No  . Sexual activity: Never  Other Topics Concern  . Not on file  Social History Narrative   Long term resident of SNF    Social Determinants of Health   Financial Resource Strain:   . Difficulty of Paying Living Expenses: Not on file  Food Insecurity:   . Worried About Charity fundraiser in the Last Year: Not on file  . Ran Out of Food in the Last Year: Not on file  Transportation Needs:   . Lack of Transportation (Medical): Not on file  . Lack of Transportation (Non-Medical): Not on file  Physical Activity:   . Days of Exercise per Week: Not on file  . Minutes of Exercise per Session: Not on file  Stress:   . Feeling of Stress : Not on file  Social Connections:   . Frequency of Communication with Friends and Family: Not on file  . Frequency of Social Gatherings with Friends and Family: Not on file  . Attends Religious Services: Not on file  . Active Member of Clubs or Organizations: Not on file  . Attends Archivist Meetings: Not on file  . Marital Status: Not on file  Intimate Partner Violence:   . Fear of Current or Ex-Partner: Not on file  . Emotionally Abused: Not on file  . Physically Abused: Not on file  . Sexually Abused: Not on file   Family History  Problem Relation Age of Onset  . Multiple myeloma Sister   . Brain cancer Sister   . Dementia Mother        died at 6  . Stroke Mother   . Heart failure Mother   . Diabetes Mother   . Heart disease Father   . Prostate cancer Brother   . Colon cancer Neg Hx       VITAL SIGNS BP (!) 96/48   Pulse 63   Temp (!) 97.1 F (36.2 C)   Resp 18   Ht _0  (1.6 m)   Wt 137 lb 6.4 oz (62.3 kg)   SpO2 98%   BMI 24.34 kg/m   Outpatient Encounter Medications as of 12/27/2019  Medication Sig  . acetaminophen (TYLENOL) 500 MG tablet Take 500 mg by mouth every 6 (six) hours as needed for moderate pain or headache.   Marland Kitchen acyclovir (ZOVIRAX) 200 MG capsule Take 200 mg by  mouth daily.  Marland Kitchen amLODipine (NORVASC) 10 MG tablet Take 10 mg by mouth daily. Not taking on dialysis days  . anastrozole (ARIMIDEX) 1 MG tablet TAKE 1 TABLET BY MOUTH DAILY  . aspirin EC 81 MG tablet Take 81 mg by mouth daily.  . calcium acetate, Phos Binder, (PHOSLYRA) 667 MG/5ML SOLN Take 667 mg by mouth daily. Take 1 tablet with snacks daily  . calcium acetate, Phos Binder, (PHOSLYRA) 667 MG/5ML SOLN Take 1,334 mg by mouth 2 (two) times daily with a meal.  . Calcium Carbonate-Vitamin  D (CALCIUM 600+D PO) Take 1 tablet by mouth daily.  . cloNIDine (CATAPRES) 0.3 MG tablet Take 0.3 mg by mouth 2 (two) times daily.   . insulin aspart (NOVOLOG FLEXPEN) 100 UNIT/ML FlexPen Inject 10 Units into the skin 3 (three) times daily with meals.  . melatonin 3 MG TABS tablet Take 6 mg by mouth at bedtime. For Sleep  . Multiple Vitamin (MULTIVITAMIN) capsule Take 1 capsule by mouth daily.  . NON FORMULARY Diet: _____ Regular, ___x___ NAS, _______Consistent Carbohydrate, _______NPO __x___Other Low Potassium  . NON FORMULARY Wanderguard #3310 to ankle for safety awareness. Check placement and function qshift. Every Shift Day, Evening, Night  . Nutritional Supplements (NEPRO PO) Take 1 Can by mouth daily.   No facility-administered encounter medications on file as of 12/27/2019.     SIGNIFICANT DIAGNOSTIC EXAMS   LABS REVIEWED PREVIOUS  10-13-19: hgb a1c 6.0; vit B 12: 1126 folate 20.9 10-17-19: wbc 8.8; hgb 8.3; hct 26.1; mcv 114.0 plt 270; glucose 190; bun 28; creat 4.85; k+ 3.9; na++ 127; ca 8.9 liver normal albumin 4.0  10-31-19: wbc 6.0; hgb 8.8; hct 28.6; mcv 117.2 plt 326; glucose 188; bun 45; creat 6.28; k+ 3.9; na++ 137;ca 9.1 liver normal albumin 3.9 11-07-19: wbc 6.1; hgb 9.2; hct 29.4 mcv 115.7 plt 276; glucose 201; bun 44; creat 5.93; k+ 4.5; na++ 136; ca 9.1 liver normal albumin 3.8 11-14-19; wbc 8.3; hgb 9.7; hct 31.2 mcv 116.4 plt 261; glucose 198; bun 44; creat 6.66; k+ 5.1; na++ 138; ca  8.8 liver normal albumin 3.9   12-12-19: wbc 7.6; hgb 11.3; hct 35.6; mcv 109.9 plt 212; glucose 602; bun 67; creat 8.62; k+ 5.6; na++ 135; ca 7.9 liver normal albumin 3.5   NO NEW LABS.   Review of Systems  Constitutional: Negative for malaise/fatigue.  Respiratory: Negative for cough and shortness of breath.   Cardiovascular: Negative for chest pain, palpitations and leg swelling.  Gastrointestinal: Negative for abdominal pain, constipation and heartburn.  Musculoskeletal: Negative for back pain, joint pain and myalgias.  Skin: Negative.   Neurological: Negative for dizziness.  Psychiatric/Behavioral: The patient is not nervous/anxious.    .   Physical Exam Constitutional:      General: She is not in acute distress.    Appearance: She is well-developed. She is not diaphoretic.  Neck:     Thyroid: No thyromegaly.  Cardiovascular:     Rate and Rhythm: Normal rate and regular rhythm.     Heart sounds: Normal heart sounds.     Comments: Pedal pulses faint  Pulmonary:     Effort: Pulmonary effort is normal. No respiratory distress.     Breath sounds: Normal breath sounds.  Abdominal:     General: Bowel sounds are normal. There is no distension.     Palpations: Abdomen is soft.     Tenderness: There is no abdominal tenderness.  Musculoskeletal:        General: Normal range of motion.     Cervical back: Neck supple.     Right lower leg: No edema.     Left lower leg: No edema.  Lymphadenopathy:     Cervical: No cervical adenopathy.  Skin:    General: Skin is warm and dry.     Comments: Left arm a/v fistula: + thrill + bruit   Neurological:     Mental Status: She is alert. Mental status is at baseline.  Psychiatric:        Mood and Affect: Mood normal.  ASSESSMENT/ PLAN:  TODAY  1. Diabetes with end stage renal disease (ESRD) hgb a1c 8.0; cbg readings remain elevated; will continue novolog 10 units with meals; will begin basaglar 10 units nightly will monitor         MD is aware of resident's narcotic use and is in agreement with current plan of care. We will attempt to wean resident as appropriate.  Ok Edwards NP [facility]  Contact [phone] Monday through Friday 8am- 5pm  After hours call [phone]

## 2019-12-29 DIAGNOSIS — N186 End stage renal disease: Secondary | ICD-10-CM | POA: Diagnosis not present

## 2019-12-29 DIAGNOSIS — Z992 Dependence on renal dialysis: Secondary | ICD-10-CM | POA: Diagnosis not present

## 2020-01-01 DIAGNOSIS — Z992 Dependence on renal dialysis: Secondary | ICD-10-CM | POA: Diagnosis not present

## 2020-01-01 DIAGNOSIS — N186 End stage renal disease: Secondary | ICD-10-CM | POA: Diagnosis not present

## 2020-01-02 ENCOUNTER — Inpatient Hospital Stay (HOSPITAL_COMMUNITY): Payer: Medicare PPO

## 2020-01-02 ENCOUNTER — Encounter (HOSPITAL_COMMUNITY): Payer: Self-pay

## 2020-01-02 ENCOUNTER — Other Ambulatory Visit: Payer: Self-pay

## 2020-01-02 VITALS — BP 114/58 | HR 74 | Temp 96.9°F | Resp 18 | Wt 140.0 lb

## 2020-01-02 DIAGNOSIS — E1165 Type 2 diabetes mellitus with hyperglycemia: Secondary | ICD-10-CM | POA: Diagnosis not present

## 2020-01-02 DIAGNOSIS — C9000 Multiple myeloma not having achieved remission: Secondary | ICD-10-CM

## 2020-01-02 DIAGNOSIS — E875 Hyperkalemia: Secondary | ICD-10-CM | POA: Diagnosis not present

## 2020-01-02 DIAGNOSIS — Z794 Long term (current) use of insulin: Secondary | ICD-10-CM | POA: Diagnosis not present

## 2020-01-02 DIAGNOSIS — Z5111 Encounter for antineoplastic chemotherapy: Secondary | ICD-10-CM | POA: Diagnosis not present

## 2020-01-02 LAB — CBC WITH DIFFERENTIAL/PLATELET
Abs Immature Granulocytes: 0.02 10*3/uL (ref 0.00–0.07)
Basophils Absolute: 0 10*3/uL (ref 0.0–0.1)
Basophils Relative: 1 %
Eosinophils Absolute: 0.1 10*3/uL (ref 0.0–0.5)
Eosinophils Relative: 1 %
HCT: 38.4 % (ref 36.0–46.0)
Hemoglobin: 12.1 g/dL (ref 12.0–15.0)
Immature Granulocytes: 0 %
Lymphocytes Relative: 17 %
Lymphs Abs: 1.4 10*3/uL (ref 0.7–4.0)
MCH: 33.6 pg (ref 26.0–34.0)
MCHC: 31.5 g/dL (ref 30.0–36.0)
MCV: 106.7 fL — ABNORMAL HIGH (ref 80.0–100.0)
Monocytes Absolute: 0.5 10*3/uL (ref 0.1–1.0)
Monocytes Relative: 6 %
Neutro Abs: 6 10*3/uL (ref 1.7–7.7)
Neutrophils Relative %: 75 %
Platelets: 222 10*3/uL (ref 150–400)
RBC: 3.6 MIL/uL — ABNORMAL LOW (ref 3.87–5.11)
RDW: 15 % (ref 11.5–15.5)
WBC: 8 10*3/uL (ref 4.0–10.5)
nRBC: 0 % (ref 0.0–0.2)

## 2020-01-02 LAB — COMPREHENSIVE METABOLIC PANEL
ALT: 5 U/L (ref 0–44)
AST: 14 U/L — ABNORMAL LOW (ref 15–41)
Albumin: 3.5 g/dL (ref 3.5–5.0)
Alkaline Phosphatase: 78 U/L (ref 38–126)
Anion gap: 14 (ref 5–15)
BUN: 65 mg/dL — ABNORMAL HIGH (ref 8–23)
CO2: 27 mmol/L (ref 22–32)
Calcium: 8.2 mg/dL — ABNORMAL LOW (ref 8.9–10.3)
Chloride: 93 mmol/L — ABNORMAL LOW (ref 98–111)
Creatinine, Ser: 9.25 mg/dL — ABNORMAL HIGH (ref 0.44–1.00)
GFR, Estimated: 4 mL/min — ABNORMAL LOW (ref >60)
Glucose, Bld: 242 mg/dL — ABNORMAL HIGH (ref 70–99)
Potassium: 4.7 mmol/L (ref 3.5–5.1)
Sodium: 134 mmol/L — ABNORMAL LOW (ref 135–145)
Total Bilirubin: 0.5 mg/dL (ref 0.3–1.2)
Total Protein: 6.8 g/dL (ref 6.5–8.1)

## 2020-01-02 LAB — LACTATE DEHYDROGENASE: LDH: 146 U/L (ref 98–192)

## 2020-01-02 MED ORDER — BORTEZOMIB CHEMO SQ INJECTION 3.5 MG (2.5MG/ML)
1.3000 mg/m2 | Freq: Once | INTRAMUSCULAR | Status: AC
  Administered 2020-01-02: 2.25 mg via SUBCUTANEOUS
  Filled 2020-01-02: qty 0.9

## 2020-01-02 MED ORDER — PROCHLORPERAZINE MALEATE 10 MG PO TABS
10.0000 mg | ORAL_TABLET | Freq: Once | ORAL | Status: AC
  Administered 2020-01-02: 10 mg via ORAL
  Filled 2020-01-02: qty 1

## 2020-01-02 MED ORDER — DEXAMETHASONE 4 MG PO TABS
10.0000 mg | ORAL_TABLET | Freq: Once | ORAL | Status: AC
  Administered 2020-01-02: 10 mg via ORAL
  Filled 2020-01-02: qty 3

## 2020-01-02 NOTE — Progress Notes (Signed)
Carly Wood presents today for injection per the provider's orders.  Velcade administration without incident; injection site WNL; see MAR for injection details.  Patient tolerated procedure well and without incident.  No questions or complaints noted at this time. Discharged via wheelchair in stable condition.

## 2020-01-02 NOTE — Progress Notes (Signed)
Ok to treat with labs patient is on dialysis.  Dr Rhys Martini, PharmD

## 2020-01-03 DIAGNOSIS — Z992 Dependence on renal dialysis: Secondary | ICD-10-CM | POA: Diagnosis not present

## 2020-01-03 DIAGNOSIS — N186 End stage renal disease: Secondary | ICD-10-CM | POA: Diagnosis not present

## 2020-01-03 LAB — PROTEIN ELECTROPHORESIS, SERUM
A/G Ratio: 1.4 (ref 0.7–1.7)
Albumin ELP: 3.6 g/dL (ref 2.9–4.4)
Alpha-1-Globulin: 0.3 g/dL (ref 0.0–0.4)
Alpha-2-Globulin: 0.8 g/dL (ref 0.4–1.0)
Beta Globulin: 0.9 g/dL (ref 0.7–1.3)
Gamma Globulin: 0.6 g/dL (ref 0.4–1.8)
Globulin, Total: 2.5 g/dL (ref 2.2–3.9)
Total Protein ELP: 6.1 g/dL (ref 6.0–8.5)

## 2020-01-03 LAB — KAPPA/LAMBDA LIGHT CHAINS
Kappa free light chain: 175 mg/L — ABNORMAL HIGH (ref 3.3–19.4)
Kappa, lambda light chain ratio: 0.9 (ref 0.26–1.65)
Lambda free light chains: 193.5 mg/L — ABNORMAL HIGH (ref 5.7–26.3)

## 2020-01-05 DIAGNOSIS — N186 End stage renal disease: Secondary | ICD-10-CM | POA: Diagnosis not present

## 2020-01-05 DIAGNOSIS — Z992 Dependence on renal dialysis: Secondary | ICD-10-CM | POA: Diagnosis not present

## 2020-01-07 DIAGNOSIS — Z992 Dependence on renal dialysis: Secondary | ICD-10-CM | POA: Diagnosis not present

## 2020-01-07 DIAGNOSIS — N186 End stage renal disease: Secondary | ICD-10-CM | POA: Diagnosis not present

## 2020-01-07 DIAGNOSIS — M7989 Other specified soft tissue disorders: Secondary | ICD-10-CM | POA: Diagnosis not present

## 2020-01-07 DIAGNOSIS — M25562 Pain in left knee: Secondary | ICD-10-CM | POA: Diagnosis not present

## 2020-01-08 ENCOUNTER — Non-Acute Institutional Stay (SKILLED_NURSING_FACILITY): Payer: Medicare PPO | Admitting: Adult Health

## 2020-01-08 ENCOUNTER — Encounter: Payer: Self-pay | Admitting: Adult Health

## 2020-01-08 DIAGNOSIS — M25562 Pain in left knee: Secondary | ICD-10-CM

## 2020-01-08 NOTE — Progress Notes (Signed)
Location:    [place] Room Number: 148/D Place of Service:  SNF (31)   CODE STATUS: Full Code  Allergies  Allergen Reactions  . Penicillins Other (See Comments)    Unsure of reaction Has patient had a PCN reaction causing immediate rash, facial/tongue/throat swelling, SOB or lightheadedness with hypotension: Unknown Has patient had a PCN reaction causing severe rash involving mucus membranes or skin necrosis: Unknown Has patient had a PCN reaction that required hospitalization: No Has patient had a PCN reaction occurring within the last 10 years: Unknown If all of the above answers are "NO", then may proceed with Cephalosporin use.    . Ace Inhibitors Cough and Other (See Comments)    Tongue swell  . Lisinopril Other (See Comments)    Tongue swell    Chief Complaint  Patient presents with  . Acute Visit    Left Knee Pain    HPI:  She has had a recent fall and is now having left knee pain. She had an x-ray done yesterday which does not show any acute findings. She does have worse pain with standing and movement. She did say that today she is feeling a little bit better. There is no swelling no warmth present.   Past Medical History:  Diagnosis Date  . Anemia   . Anxiety   . Chronic kidney disease   . Chronic renal disease, stage 4, severely decreased glomerular filtration rate (GFR) between 15-29 mL/min/1.73 square meter (HCC) 08/22/2015  . Complication of anesthesia    delirious after Breast Surgery  . Dementia ([place])    mild  . Depression   . Diabetes mellitus without complication ([place])    type II  . Dyspnea    with activity  . GERD (gastroesophageal reflux disease)   . Glaucoma   . Hypertension   . Pneumonia   . Stage 1 infiltrating ductal carcinoma of right female breast ([place]) 08/21/2015   ER+ PR+ HER 2 neu + (3+) T1cN0     Past Surgical History:  Procedure Laterality Date  . AV FISTULA PLACEMENT Left 11/22/2017   Procedure:  ARTERIOVENOUS (AV) FISTULA CREATION LEFT ARM;  Surgeon: Elam Dutch, MD;  Location: [place];  Service: Vascular;  Laterality: Left;  . BIOPSY  08/07/2016   Procedure: BIOPSY;  Surgeon: Daneil Dolin, MD;  [facility];  Service: Endoscopy;;  gastric ulcer biopsy  . COLONOSCOPY    . ESOPHAGOGASTRODUODENOSCOPY N/A 08/07/2016   Procedure: ESOPHAGOGASTRODUODENOSCOPY (EGD);  Surgeon: Daneil Dolin, MD;  [facility];  Service: Endoscopy;  Laterality: N/A;  1215-rescheduled to 6/1 @ 2:30pm per Tretha Sciara  . ESOPHAGOGASTRODUODENOSCOPY N/A 11/27/2016   Procedure: ESOPHAGOGASTRODUODENOSCOPY (EGD);  Surgeon: Daneil Dolin, MD;  [facility];  Service: Endoscopy;  Laterality: N/A;  8:15am  . FISTULA SUPERFICIALIZATION Left 02/14/2018   Procedure: FISTULA SUPERFICIALIZATION LEFT ARM;  Surgeon: Angelia Mould, MD;  Location: [place];  Service: Vascular;  Laterality: Left;  . FRACTURE SURGERY Right    ankle  . MALONEY DILATION N/A 08/07/2016   Procedure: Venia Minks DILATION;  Surgeon: Daneil Dolin, MD;  [facility];  Service: Endoscopy;  Laterality: N/A;  . MASTECTOMY, PARTIAL Right   . PERIPHERAL VASCULAR BALLOON ANGIOPLASTY Left 07/13/2019   Procedure: PERIPHERAL VASCULAR BALLOON ANGIOPLASTY;  Surgeon: Marty Heck, MD;  Location: [place] CV LAB;  Service: Cardiovascular;  Laterality: Left;  arm fistulogram  . PORT-A-CATH REMOVAL Left 11/22/2017  Procedure: REMOVAL PORT-A-CATH LEFT CHEST;  Surgeon: Elam Dutch, MD;  Location: [facility] OR;  Service: Vascular;  Laterality: Left;  . RETINAL DETACHMENT SURGERY Right     Social History   Socioeconomic History  . Marital status: Single    Spouse name: Not on file  . Number of children: Not on file  . Years of education: Not on file  . Highest education level: Not on file  Occupational History  . Occupation: retired   Tobacco Use  . Smoking status: Never Smoker  . Smokeless tobacco: Never  Used  Vaping Use  . Vaping Use: Never used  Substance and Sexual Activity  . Alcohol use: No    Alcohol/week: 0.0 standard drinks  . Drug use: No  . Sexual activity: Never  Other Topics Concern  . Not on file  Social History Narrative   Long term resident of SNF    Social Determinants of Health   Financial Resource Strain:   . Difficulty of Paying Living Expenses: Not on file  Food Insecurity:   . Worried About Charity fundraiser in the Last Year: Not on file  . Ran Out of Food in the Last Year: Not on file  Transportation Needs:   . Lack of Transportation (Medical): Not on file  . Lack of Transportation (Non-Medical): Not on file  Physical Activity:   . Days of Exercise per Week: Not on file  . Minutes of Exercise per Session: Not on file  Stress:   . Feeling of Stress : Not on file  Social Connections:   . Frequency of Communication with Friends and Family: Not on file  . Frequency of Social Gatherings with Friends and Family: Not on file  . Attends Religious Services: Not on file  . Active Member of Clubs or Organizations: Not on file  . Attends Archivist Meetings: Not on file  . Marital Status: Not on file  Intimate Partner Violence:   . Fear of Current or Ex-Partner: Not on file  . Emotionally Abused: Not on file  . Physically Abused: Not on file  . Sexually Abused: Not on file   Family History  Problem Relation Age of Onset  . Multiple myeloma Sister   . Brain cancer Sister   . Dementia Mother        died at 33  . Stroke Mother   . Heart failure Mother   . Diabetes Mother   . Heart disease Father   . Prostate cancer Brother   . Colon cancer Neg Hx       VITAL SIGNS BP 128/67   Pulse (!) 57   Temp 97.9 F (36.6 C)   Resp 20   Ht 5' 3" (1.6 m)   Wt 137 lb 6.4 oz (62.3 kg)   SpO2 94%   BMI 24.34 kg/m   Outpatient Encounter Medications as of 01/08/2020  Medication Sig  . acetaminophen (TYLENOL) 500 MG tablet Take 500 mg by mouth  every 6 (six) hours as needed for moderate pain or headache.   Marland Kitchen acyclovir (ZOVIRAX) 200 MG capsule Take 200 mg by mouth daily.  Marland Kitchen amLODipine (NORVASC) 10 MG tablet Take 10 mg by mouth daily. Not taking on dialysis days  . anastrozole (ARIMIDEX) 1 MG tablet TAKE 1 TABLET BY MOUTH DAILY  . aspirin EC 81 MG tablet Take 81 mg by mouth daily.  . calcium acetate, Phos Binder, (PHOSLYRA) 667 MG/5ML SOLN Take 667 mg by mouth daily. Take 1  tablet with snacks daily  . calcium acetate, Phos Binder, (PHOSLYRA) 667 MG/5ML SOLN Take 1,334 mg by mouth 2 (two) times daily with a meal.  . Calcium Carbonate-Vitamin D (CALCIUM 600+D PO) Take 1 tablet by mouth daily.  . cloNIDine (CATAPRES) 0.3 MG tablet Take 0.3 mg by mouth 2 (two) times daily.   . insulin aspart (NOVOLOG FLEXPEN) 100 UNIT/ML FlexPen Inject 10 Units into the skin 3 (three) times daily with meals.  . Insulin Glargine (BASAGLAR KWIKPEN) 100 UNIT/ML Inject 10 Units into the skin at bedtime.  . melatonin 3 MG TABS tablet Take 6 mg by mouth at bedtime. For Sleep  . Multiple Vitamin (MULTIVITAMIN) capsule Take 1 capsule by mouth daily.  . NON FORMULARY Diet: _____ Regular, ___x___ NAS, _______Consistent Carbohydrate, _______NPO __x___Other Low Potassium  . NON FORMULARY Wanderguard #3310 to ankle for safety awareness. Check placement and function qshift. Every Shift Day, Evening, Night  . Nutritional Supplements (NEPRO PO) Take 1 Can by mouth daily.   No facility-administered encounter medications on file as of 01/08/2020.     SIGNIFICANT DIAGNOSTIC EXAMS  TODAY  01-07-20: left knee x-ray: no acute abnormality    LABS REVIEWED PREVIOUS  10-13-19: hgb a1c 6.0; vit B 12: 1126 folate 20.9 10-17-19: wbc 8.8; hgb 8.3; hct 26.1; mcv 114.0 plt 270; glucose 190; bun 28; creat 4.85; k+ 3.9; na++ 127; ca 8.9 liver normal albumin 4.0  10-31-19: wbc 6.0; hgb 8.8; hct 28.6; mcv 117.2 plt 326; glucose 188; bun 45; creat 6.28; k+ 3.9; na++ 137;ca 9.1  liver normal albumin 3.9 11-07-19: wbc 6.1; hgb 9.2; hct 29.4 mcv 115.7 plt 276; glucose 201; bun 44; creat 5.93; k+ 4.5; na++ 136; ca 9.1 liver normal albumin 3.8 11-14-19; wbc 8.3; hgb 9.7; hct 31.2 mcv 116.4 plt 261; glucose 198; bun 44; creat 6.66; k+ 5.1; na++ 138; ca 8.8 liver normal albumin 3.9   12-12-19: wbc 7.6; hgb 11.3; hct 35.6; mcv 109.9 plt 212; glucose 602; bun 67; creat 8.62; k+ 5.6; na++ 135; ca 7.9 liver normal albumin 3.5   NO NEW LABS.   Review of Systems  Constitutional: Negative for malaise/fatigue.  Respiratory: Negative for cough and shortness of breath.   Cardiovascular: Negative for chest pain, palpitations and leg swelling.  Gastrointestinal: Negative for abdominal pain, constipation and heartburn.  Musculoskeletal: Positive for joint pain. Negative for back pain and myalgias.  Skin: Negative.   Neurological: Negative for dizziness.  Psychiatric/Behavioral: The patient is not nervous/anxious.    Physical Exam Constitutional:      General: She is not in acute distress.    Appearance: She is well-developed. She is not diaphoretic.  Neck:     Thyroid: No thyromegaly.  Cardiovascular:     Rate and Rhythm: Normal rate and regular rhythm.     Pulses: Normal pulses.     Heart sounds: Normal heart sounds.  Pulmonary:     Effort: Pulmonary effort is normal. No respiratory distress.     Breath sounds: Normal breath sounds.  Abdominal:     General: Bowel sounds are normal. There is no distension.     Palpations: Abdomen is soft.     Tenderness: There is no abdominal tenderness.  Musculoskeletal:        General: Normal range of motion.     Cervical back: Neck supple.     Right lower leg: No edema.     Left lower leg: No edema.  Lymphadenopathy:     Cervical: No cervical adenopathy.  Skin:    General: Skin is warm and dry.     Comments: Left arm a/v fistula: + thrill + bruit    Neurological:     Mental Status: She is alert. Mental status is at baseline.    Psychiatric:        Mood and Affect: Mood normal.       ASSESSMENT/ PLAN:  TODAY  1. Left knee pain Is worse will begin tylenol cr 650 mg every 6 hours  Will monitor her status.   MD is aware of resident's narcotic use and is in agreement with current plan of care. We will attempt to wean resident as appropriate.  Ok Edwards NP [facility]  Contact [phone] Monday through Friday 8am- 5pm  After hours call [phone]

## 2020-01-09 ENCOUNTER — Inpatient Hospital Stay (HOSPITAL_BASED_OUTPATIENT_CLINIC_OR_DEPARTMENT_OTHER): Payer: Medicare PPO | Admitting: Hematology

## 2020-01-09 ENCOUNTER — Other Ambulatory Visit: Payer: Self-pay

## 2020-01-09 ENCOUNTER — Inpatient Hospital Stay (HOSPITAL_COMMUNITY): Payer: Medicare PPO | Attending: Hematology

## 2020-01-09 ENCOUNTER — Inpatient Hospital Stay (HOSPITAL_COMMUNITY)
Admit: 2020-01-09 | Discharge: 2020-01-09 | Disposition: A | Discharge status: Home / Self Care | Payer: Medicare PPO | Attending: Hematology | Admitting: Hematology

## 2020-01-09 ENCOUNTER — Inpatient Hospital Stay (HOSPITAL_COMMUNITY): Payer: Medicare PPO

## 2020-01-09 VITALS — BP 106/45 | HR 75 | Temp 96.9°F | Resp 18 | Wt 135.8 lb

## 2020-01-09 DIAGNOSIS — I120 Hypertensive chronic kidney disease with stage 5 chronic kidney disease or end stage renal disease: Secondary | ICD-10-CM | POA: Diagnosis not present

## 2020-01-09 DIAGNOSIS — H409 Unspecified glaucoma: Secondary | ICD-10-CM | POA: Diagnosis not present

## 2020-01-09 DIAGNOSIS — F0390 Unspecified dementia without behavioral disturbance: Secondary | ICD-10-CM | POA: Diagnosis not present

## 2020-01-09 DIAGNOSIS — Z992 Dependence on renal dialysis: Secondary | ICD-10-CM | POA: Insufficient documentation

## 2020-01-09 DIAGNOSIS — Z79811 Long term (current) use of aromatase inhibitors: Secondary | ICD-10-CM | POA: Insufficient documentation

## 2020-01-09 DIAGNOSIS — C9000 Multiple myeloma not having achieved remission: Secondary | ICD-10-CM | POA: Insufficient documentation

## 2020-01-09 DIAGNOSIS — C50411 Malignant neoplasm of upper-outer quadrant of right female breast: Secondary | ICD-10-CM | POA: Insufficient documentation

## 2020-01-09 DIAGNOSIS — N186 End stage renal disease: Secondary | ICD-10-CM | POA: Insufficient documentation

## 2020-01-09 DIAGNOSIS — M8580 Other specified disorders of bone density and structure, unspecified site: Secondary | ICD-10-CM | POA: Insufficient documentation

## 2020-01-09 DIAGNOSIS — E1122 Type 2 diabetes mellitus with diabetic chronic kidney disease: Secondary | ICD-10-CM | POA: Diagnosis not present

## 2020-01-09 DIAGNOSIS — Z170 Estrogen receptor positive status [ER+]: Secondary | ICD-10-CM | POA: Insufficient documentation

## 2020-01-09 DIAGNOSIS — K219 Gastro-esophageal reflux disease without esophagitis: Secondary | ICD-10-CM | POA: Insufficient documentation

## 2020-01-09 DIAGNOSIS — Z5111 Encounter for antineoplastic chemotherapy: Secondary | ICD-10-CM | POA: Insufficient documentation

## 2020-01-09 DIAGNOSIS — G479 Sleep disorder, unspecified: Secondary | ICD-10-CM | POA: Diagnosis not present

## 2020-01-09 LAB — COMPREHENSIVE METABOLIC PANEL
ALT: 11 U/L (ref 0–44)
AST: 15 U/L (ref 15–41)
Albumin: 3.5 g/dL (ref 3.5–5.0)
Alkaline Phosphatase: 78 U/L (ref 38–126)
Anion gap: 18 — ABNORMAL HIGH (ref 5–15)
BUN: 112 mg/dL — ABNORMAL HIGH (ref 8–23)
CO2: 22 mmol/L (ref 22–32)
Calcium: 8.3 mg/dL — ABNORMAL LOW (ref 8.9–10.3)
Chloride: 92 mmol/L — ABNORMAL LOW (ref 98–111)
Creatinine, Ser: 14.53 mg/dL — ABNORMAL HIGH (ref 0.44–1.00)
GFR, Estimated: 2 mL/min — ABNORMAL LOW (ref >60)
Glucose, Bld: 225 mg/dL — ABNORMAL HIGH (ref 70–99)
Potassium: 6.4 mmol/L (ref 3.5–5.1)
Sodium: 132 mmol/L — ABNORMAL LOW (ref 135–145)
Total Bilirubin: 0.5 mg/dL (ref 0.3–1.2)
Total Protein: 6.6 g/dL (ref 6.5–8.1)

## 2020-01-09 LAB — CBC WITH DIFFERENTIAL/PLATELET
Abs Immature Granulocytes: 0.02 10*3/uL (ref 0.00–0.07)
Basophils Absolute: 0.1 10*3/uL (ref 0.0–0.1)
Basophils Relative: 1 %
Eosinophils Absolute: 0.2 10*3/uL (ref 0.0–0.5)
Eosinophils Relative: 3 %
HCT: 39.7 % (ref 36.0–46.0)
Hemoglobin: 12.1 g/dL (ref 12.0–15.0)
Immature Granulocytes: 0 %
Lymphocytes Relative: 21 %
Lymphs Abs: 1.4 10*3/uL (ref 0.7–4.0)
MCH: 32.2 pg (ref 26.0–34.0)
MCHC: 30.5 g/dL (ref 30.0–36.0)
MCV: 105.6 fL — ABNORMAL HIGH (ref 80.0–100.0)
Monocytes Absolute: 0.5 10*3/uL (ref 0.1–1.0)
Monocytes Relative: 8 %
Neutro Abs: 4.6 10*3/uL (ref 1.7–7.7)
Neutrophils Relative %: 67 %
Platelets: 299 10*3/uL (ref 150–400)
RBC: 3.76 MIL/uL — ABNORMAL LOW (ref 3.87–5.11)
RDW: 15.2 % (ref 11.5–15.5)
WBC: 6.9 10*3/uL (ref 4.0–10.5)
nRBC: 0 % (ref 0.0–0.2)

## 2020-01-09 MED ORDER — BORTEZOMIB CHEMO SQ INJECTION 3.5 MG (2.5MG/ML)
1.3000 mg/m2 | Freq: Once | INTRAMUSCULAR | Status: AC
  Administered 2020-01-09: 2.25 mg via SUBCUTANEOUS
  Filled 2020-01-09: qty 0.9

## 2020-01-09 MED ORDER — PROCHLORPERAZINE MALEATE 10 MG PO TABS
10.0000 mg | ORAL_TABLET | Freq: Once | ORAL | Status: AC
  Administered 2020-01-09: 10 mg via ORAL
  Filled 2020-01-09: qty 1

## 2020-01-09 MED ORDER — DEXAMETHASONE 4 MG PO TABS
10.0000 mg | ORAL_TABLET | Freq: Once | ORAL | Status: AC
  Administered 2020-01-09: 10 mg via ORAL
  Filled 2020-01-09: qty 3

## 2020-01-09 NOTE — Progress Notes (Signed)
Patient was assessed by Dr. Katragadda and labs have been reviewed.  Patient is okay to proceed with treatment today. Primary RN and pharmacy aware.   

## 2020-01-09 NOTE — Patient Instructions (Signed)
Carly Wood at [facility] Discharge Instructions  You were seen today by Dr. Delton Coombes. He went over your recent results. You received your treatment today; continue receiving your weekly treatment. You will be scheduled for an x-ray of your left ankle. Dr. Delton Coombes will see you back in 3 months for labs and follow up.   Thank you for choosing [place] at [facility] to provide your oncology and hematology care.  To afford each patient quality time with our provider, please arrive at least 15 minutes before your scheduled appointment time.   If you have a lab appointment with the [facility] please come in thru the Main Entrance and check in at the main information desk  You need to re-schedule your appointment should you arrive 10 or more minutes late.  We strive to give you quality time with our providers, and arriving late affects you and other patients whose appointments are after yours.  Also, if you no show three or more times for appointments you may be dismissed from the clinic at the providers discretion.     Again, thank you for choosing [facility].  Our hope is that these requests will decrease the amount of time that you wait before being seen by our physicians.       _____________________________________________________________  Should you have questions after your visit to [facility], please contact our office at [phone] between the hours of 8:00 a.m. and 4:30 p.m.  Voicemails left after 4:00 p.m. will not be returned until the following business day.  For prescription refill requests, have your pharmacy contact our office and allow 72 hours.    Cancer Center Support Programs:   > Cancer Support Group  2nd Tuesday of the month 1pm-2pm, Journey Room

## 2020-01-09 NOTE — Progress Notes (Signed)
Carly Wood, [place] [address]   CLINIC:  Medical Oncology/Hematology  PCP:  Gerlene Fee, NP [address] [place] [address] [phone]   REASON FOR VISIT:  Follow-up for plasma cell myeloma and anemia  PRIOR THERAPY: None  NGS Results: Not done  CURRENT THERAPY: Velcade & Decadron 3 weeks on, 1 week off  BRIEF ONCOLOGIC HISTORY:  Oncology History  Stage 1 infiltrating ductal carcinoma of right female breast ([place])  09/12/2014 Mammogram   Mass in upper outer R breast, middle third depth appears slightly larger than it was on prior exam with more irreg spiculated margins   10/02/2014 Pathology Results   biopsy with invasive ductal carcinoma high grade 1.1 cm, dcis solid type. additional R breast tissue, excision, invasive ductal high grade 0.9 cm    10/24/2014 Pathology Results   no residual invasive carcinoma, DCIS, focal, atypical ductal hyperplasia, 0/7 LN positive for metastatic carcinoma ER > 90%, PR 30%, HER 2 2+   10/24/2014 Cancer Staging   T1cN0M0   10/24/2014 Surgery   R mastectomy, T1c, N0   12/28/2014 - 11/22/2015 Chemotherapy   Taxol/herceptin weekly X 12, Herceptin every 21 days, last due in September 2017   03/11/2015 -  Anti-estrogen oral therapy   Arimidex 1 mg daily   09/12/2015 Imaging   MUGA- The left ventricular ejection fraction equals 68%.   06/08/2016 Treatment Plan Change   Started Nerlynx   Multiple myeloma ([place])  10/04/2018 Initial Diagnosis   Multiple myeloma ([place])   10/11/2018 -  Chemotherapy   The patient had dexamethasone (DECADRON) tablet 10 mg, 10 mg (100 % of original dose 10 mg), Oral,  Once, 17 of 18 cycles Dose modification: 10 mg (original dose 10 mg, Cycle 1) Administration: 10 mg (10/11/2018), 10 mg (10/18/2018), 10 mg (10/25/2018), 10 mg (11/01/2018), 10 mg (11/08/2018), 10 mg (11/15/2018), 10 mg (11/22/2018), 10 mg (11/29/2018), 10 mg (12/06/2018), 10 mg (12/20/2018), 10 mg (12/27/2018), 10 mg  (01/03/2019), 10 mg (01/17/2019), 10 mg (01/31/2019), 10 mg (02/14/2019), 10 mg (02/21/2019), 10 mg (02/28/2019), 10 mg (03/14/2019), 10 mg (03/21/2019), 10 mg (03/28/2019), 10 mg (04/11/2019), 10 mg (04/18/2019), 10 mg (04/25/2019), 10 mg (05/16/2019), 10 mg (05/23/2019), 10 mg (05/30/2019), 10 mg (06/13/2019), 10 mg (06/20/2019), 10 mg (06/27/2019), 10 mg (07/11/2019), 10 mg (07/18/2019), 10 mg (07/25/2019), 10 mg (08/08/2019), 10 mg (08/15/2019), 10 mg (08/22/2019), 10 mg (09/05/2019), 10 mg (09/12/2019), 10 mg (09/19/2019), 10 mg (10/03/2019), 10 mg (10/10/2019), 10 mg (10/17/2019), 10 mg (10/31/2019), 10 mg (11/07/2019), 10 mg (11/14/2019), 10 mg (11/28/2019), 10 mg (12/05/2019), 10 mg (12/26/2019) bortezomib SQ (VELCADE) chemo injection 2.5 mg, 1.3 mg/m2 = 2.5 mg, Subcutaneous,  Once, 17 of 18 cycles Administration: 2.5 mg (10/11/2018), 2.5 mg (10/18/2018), 2.5 mg (10/25/2018), 2.5 mg (11/01/2018), 2.5 mg (11/08/2018), 2.5 mg (11/15/2018), 2.5 mg (11/22/2018), 2.5 mg (11/29/2018), 2.5 mg (12/06/2018), 2.5 mg (12/20/2018), 2.5 mg (12/27/2018), 2.5 mg (01/03/2019), 2.5 mg (01/17/2019), 2.5 mg (01/31/2019), 2.5 mg (02/14/2019), 2.5 mg (02/21/2019), 2.5 mg (02/28/2019), 2.5 mg (03/14/2019), 2.5 mg (03/21/2019), 2.5 mg (03/28/2019), 2.5 mg (04/11/2019), 2.5 mg (04/18/2019), 2.5 mg (04/25/2019), 2.5 mg (05/16/2019), 2.5 mg (05/23/2019), 2.5 mg (05/30/2019), 2.25 mg (06/13/2019), 2.25 mg (06/20/2019), 2.25 mg (06/27/2019), 2.25 mg (07/11/2019), 2.25 mg (07/18/2019), 2.25 mg (07/25/2019), 2.25 mg (08/08/2019), 2.25 mg (08/15/2019), 2.25 mg (08/22/2019), 2.25 mg (09/05/2019), 2.25 mg (09/12/2019), 2.25 mg (09/19/2019), 2.25 mg (10/03/2019), 2.25 mg (10/10/2019), 2.25 mg (10/17/2019), 2.25 mg (10/31/2019), 2.25 mg (11/07/2019),  2.25 mg (11/14/2019), 2.25 mg (11/28/2019), 2.25 mg (12/05/2019), 2.25 mg (12/12/2019), 2.25 mg (12/26/2019)  for chemotherapy treatment.      CANCER STAGING: Cancer Staging Stage 1 infiltrating ductal carcinoma of right female breast [facility]) Staging form: Breast, AJCC 7th  Edition - Clinical stage from 08/30/2015: Stage IA (T1c, N0, M0) - Signed by Baird Cancer, PA-C on 08/30/2015   INTERVAL HISTORY:  Carly Wood, a 84 y.o. female, returns for routine follow-up and consideration for next cycle of chemotherapy. Charletha was last seen on 11/14/2019.  Due for day #15 of cycle #17 of bortezomib today.   Today she is accompanied by her sisters. Overall, she tells me she has been feeling okay. She reports that she fell several days ago after coming from dialysis and hurt her left ankle, which is still sore. It was x-rayed. She reports having difficulty moving her left ankle and requests something for the pain. She is tolerating the Velcade well and denies having numbness or tingling.   Overall, she feels ready for next cycle of chemo today.    REVIEW OF SYSTEMS:  Review of Systems  Constitutional: Negative for appetite change.  Psychiatric/Behavioral: Positive for sleep disturbance.  All other systems reviewed and are negative.   PAST MEDICAL/SURGICAL HISTORY:  Past Medical History:  Diagnosis Date  . Anemia   . Anxiety   . Chronic kidney disease   . Chronic renal disease, stage 4, severely decreased glomerular filtration rate (GFR) between 15-29 mL/min/1.73 square meter (HCC) 08/22/2015  . Complication of anesthesia    delirious after Breast Surgery  . Dementia ([place])    mild  . Depression   . Diabetes mellitus without complication ([place])    type II  . Dyspnea    with activity  . GERD (gastroesophageal reflux disease)   . Glaucoma   . Hypertension   . Pneumonia   . Stage 1 infiltrating ductal carcinoma of right female breast ([place]) 08/21/2015   ER+ PR+ HER 2 neu + (3+) T1cN0    Past Surgical History:  Procedure Laterality Date  . AV FISTULA PLACEMENT Left 11/22/2017   Procedure: ARTERIOVENOUS (AV) FISTULA CREATION LEFT ARM;  Surgeon: Elam Dutch, MD;  Location: [facility];  Service: Vascular;  Laterality: Left;  . BIOPSY  08/07/2016    Procedure: BIOPSY;  Surgeon: Daneil Dolin, MD;  [facility];  Service: Endoscopy;;  gastric ulcer biopsy  . COLONOSCOPY    . ESOPHAGOGASTRODUODENOSCOPY N/A 08/07/2016   Procedure: ESOPHAGOGASTRODUODENOSCOPY (EGD);  Surgeon: Daneil Dolin, MD;  [facility];  Service: Endoscopy;  Laterality: N/A;  1215-rescheduled to 6/1 @ 2:30pm per Tretha Sciara  . ESOPHAGOGASTRODUODENOSCOPY N/A 11/27/2016   Procedure: ESOPHAGOGASTRODUODENOSCOPY (EGD);  Surgeon: Daneil Dolin, MD;  [facility];  Service: Endoscopy;  Laterality: N/A;  8:15am  . FISTULA SUPERFICIALIZATION Left 02/14/2018   Procedure: FISTULA SUPERFICIALIZATION LEFT ARM;  Surgeon: Angelia Mould, MD;  Location: [facility];  Service: Vascular;  Laterality: Left;  . FRACTURE SURGERY Right    ankle  . MALONEY DILATION N/A 08/07/2016   Procedure: Venia Minks DILATION;  Surgeon: Daneil Dolin, MD;  [facility];  Service: Endoscopy;  Laterality: N/A;  . MASTECTOMY, PARTIAL Right   . PERIPHERAL VASCULAR BALLOON ANGIOPLASTY Left 07/13/2019   Procedure: PERIPHERAL VASCULAR BALLOON ANGIOPLASTY;  Surgeon: Marty Heck, MD;  Location: [facility];  Service: Cardiovascular;  Laterality: Left;  arm fistulogram  . PORT-A-CATH REMOVAL Left 11/22/2017  Procedure: REMOVAL PORT-A-CATH LEFT CHEST;  Surgeon: Elam Dutch, MD;  Location: [facility];  Service: Vascular;  Laterality: Left;  . RETINAL DETACHMENT SURGERY Right     SOCIAL HISTORY:  Social History   Socioeconomic History  . Marital status: Single    Spouse name: Not on file  . Number of children: Not on file  . Years of education: Not on file  . Highest education level: Not on file  Occupational History  . Occupation: retired   Tobacco Use  . Smoking status: Never Smoker  . Smokeless tobacco: Never Used  Vaping Use  . Vaping Use: Never used  Substance and Sexual Activity  . Alcohol use: No    Alcohol/week: 0.0 standard drinks  . Drug  use: No  . Sexual activity: Never  Other Topics Concern  . Not on file  Social History Narrative   Long term resident of SNF    Social Determinants of Health   Financial Resource Strain: Low Risk   . Difficulty of Paying Living Expenses: Not very hard  Food Insecurity: No Food Insecurity  . Worried About Charity fundraiser in the Last Year: Never true  . Ran Out of Food in the Last Year: Never true  Transportation Needs: No Transportation Needs  . Lack of Transportation (Medical): No  . Lack of Transportation (Non-Medical): No  Physical Activity: Inactive  . Days of Exercise per Week: 0 days  . Minutes of Exercise per Session: 0 min  Stress: No Stress Concern Present  . Feeling of Stress : Not at all  Social Connections: Moderately Isolated  . Frequency of Communication with Friends and Family: More than three times a week  . Frequency of Social Gatherings with Friends and Family: Once a week  . Attends Religious Services: More than 4 times per year  . Active Member of Clubs or Organizations: No  . Attends Archivist Meetings: Never  . Marital Status: Never married  Intimate Partner Violence: Not At Risk  . Fear of Current or Ex-Partner: No  . Emotionally Abused: No  . Physically Abused: No  . Sexually Abused: No    FAMILY HISTORY:  Family History  Problem Relation Age of Onset  . Multiple myeloma Sister   . Brain cancer Sister   . Dementia Mother        died at 50  . Stroke Mother   . Heart failure Mother   . Diabetes Mother   . Heart disease Father   . Prostate cancer Brother   . Colon cancer Neg Hx     CURRENT MEDICATIONS:  Current Outpatient Medications  Medication Sig Dispense Refill  . anastrozole (ARIMIDEX) 1 MG tablet TAKE 1 TABLET BY MOUTH DAILY 30 tablet 6   No current facility-administered medications for this visit.    ALLERGIES:  Allergies  Allergen Reactions  . Penicillins Other (See Comments)    Unsure of reaction Has patient  had a PCN reaction causing immediate rash, facial/tongue/throat swelling, SOB or lightheadedness with hypotension: Unknown Has patient had a PCN reaction causing severe rash involving mucus membranes or skin necrosis: Unknown Has patient had a PCN reaction that required hospitalization: No Has patient had a PCN reaction occurring within the last 10 years: Unknown If all of the above answers are "NO", then may proceed with Cephalosporin use.    . Ace Inhibitors Cough and Other (See Comments)    Tongue swell  . Lisinopril Other (See Comments)    Tongue  swell    PHYSICAL EXAM:  Performance status (ECOG): 2 - Symptomatic, <50% confined to bed  Vitals:   01/09/20 1417  BP: (!) 106/45  Pulse: 75  Resp: 18  Temp: (!) 96.9 F (36.1 C)  SpO2: 100%   Wt Readings from Last 3 Encounters:  01/09/20 135 lb 12.8 oz (61.6 kg)  01/08/20 137 lb 6.4 oz (62.3 kg)  01/02/20 140 lb (63.5 kg)   Physical Exam Vitals reviewed.  Constitutional:      Appearance: Normal appearance.  Cardiovascular:     Rate and Rhythm: Normal rate and regular rhythm.     Pulses: Normal pulses.     Heart sounds: Normal heart sounds.  Pulmonary:     Effort: Pulmonary effort is normal.     Breath sounds: Normal breath sounds.  Musculoskeletal:     Right lower leg: Edema (1+) present.     Left lower leg: Edema (1+) present.     Right ankle: No tenderness. Normal range of motion.     Left ankle: Tenderness present. Decreased range of motion.  Neurological:     General: No focal deficit present.     Mental Status: She is alert and oriented to person, place, and time.  Psychiatric:        Mood and Affect: Mood normal.        Behavior: Behavior normal.     LABORATORY DATA:  I have reviewed the labs as listed.  CBC Latest Ref Rng & Units 01/09/2020 01/02/2020 12/26/2019  WBC 4.0 - 10.5 K/uL 6.9 8.0 7.0  Hemoglobin 12.0 - 15.0 g/dL 12.1 12.1 11.9(L)  Hematocrit 36 - 46 % 39.7 38.4 38.1  Platelets 150 - 400 K/uL  299 222 302   CMP Latest Ref Rng & Units 01/09/2020 01/02/2020 12/27/2019  Glucose 70 - 99 mg/dL 225(H) 242(H) 220(H)  BUN 8 - 23 mg/dL PENDING 65(H) 89(H)  Creatinine 0.44 - 1.00 mg/dL 14.53(H) 9.25(H) 11.61(H)  Sodium 135 - 145 mmol/L 132(L) 134(L) 137  Potassium 3.5 - 5.1 mmol/L 6.4(HH) 4.7 4.8  Chloride 98 - 111 mmol/L 92(L) 93(L) 90(L)  CO2 22 - 32 mmol/L _0 Calcium 8.9 - 10.3 mg/dL 8.3(L) 8.2(L) 8.3(L)  Total Protein 6.5 - 8.1 g/dL 6.6 6.8 -  Total Bilirubin 0.3 - 1.2 mg/dL 0.5 0.5 -  Alkaline Phos 38 - 126 U/L 78 78 -  AST 15 - 41 U/L 15 14(L) -  ALT 0 - 44 U/L 11 5 -   Lab Results  Component Value Date   LDH 146 01/02/2020   LDH 122 12/12/2019   LDH 160 10/17/2019   Lab Results  Component Value Date   TOTALPROTELP 6.1 01/02/2020   ALBUMINELP 3.6 01/02/2020   A1GS 0.3 01/02/2020   A2GS 0.8 01/02/2020   BETS 0.9 01/02/2020   GAMS 0.6 01/02/2020   MSPIKE Not Observed 01/02/2020   SPEI Comment 01/02/2020    Lab Results  Component Value Date   KPAFRELGTCHN 175.0 (H) 01/02/2020   LAMBDASER 193.5 (H) 01/02/2020   KAPLAMBRATIO 0.90 01/02/2020    DIAGNOSTIC IMAGING:  I have independently reviewed the scans and discussed with the patient. No results found.   ASSESSMENT:  1. IgA lambda plasma cell myeloma, stage II, standard risk: -BMBX on 09/20/2018 shows plasma cell myeloma, 30% plasma cells. FISH panel was normal. Chromosome analysis normal. -Labs at diagnosis on 08/29/2018 with M spike 0.9 g. Kappa light chains 148, lambda light chains 1132, ratio 0.13. LDH normal. Beta-2  microglobulin 13.5. She had transfusion dependent anemia. -Velcade and dexamethasone started on 10/11/2018. -Myeloma panel on 07/11/2019 shows SPEP is negative. Free light chain ratio improved to 1.09. Lambda light chains are 128, improved from 141. Kappa light chains are 140. -She has been resident at [facility] since 10/12/2019.  2. Stage I IDC of the right breast, ER/PR positive,  HER-2 negative: -She is on anastrozole.   PLAN:  1. IgA lambda plasma cell myeloma: -She is continuing to tolerate Velcade and dexamethasone very well. -Reviewed labs from 01/02/2020.  M spike is not observed.  Free lambda light chains are 193 and ratio is 0.9. -CBC is normal.  Potassium is high at 6.4.  Recommend low potassium diet. -She reportedly fell after dialysis and reports that left ankle is hurting.  Reportedly x-rays of the knees were done.  I would order x-ray of the ankle.  There is very minimal swelling but severe restriction of motion. -She will proceed with her treatments as planned.  I plan to see her back in 12 weeks with repeat labs.  2. ESRD on HD: -Continue HD on Monday, Wednesday and Friday.  3. Bone strengthening: -Bisphosphonates not started due to dialysis.  4. Osteopenia: -Bone density in 2017 was osteopenic.  Plan to repeat DEXA scan in the future.  5. Right breast cancer: -Continue anastrozole.   Orders placed this encounter:  Orders Placed This Encounter  Procedures  . DG Ankle Complete Left     Derek Jack, MD [place] [phone]   I, Milinda Antis, am acting as a scribe for Dr. Sanda Linger.  I, Derek Jack MD, have reviewed the above documentation for accuracy and completeness, and I agree with the above.

## 2020-01-09 NOTE — Patient Instructions (Signed)
[facility] Discharge Instructions for Patients Receiving Chemotherapy   Beginning January 23rd 2017 lab work for the [facility] will be done in the  Main lab at [facility] on 1st floor. If you have a lab appointment with the [facility] please come in thru the  Main Entrance and check in at the main information desk   Today you received the following chemotherapy agent Velcade injection. Follow-up as scheduled  To help prevent nausea and vomiting after your treatment, we encourage you to take your nausea medication   If you develop nausea and vomiting, or diarrhea that is not controlled by your medication, call the clinic.  The clinic phone number is [phone]. Office hours are Monday-Friday 8:30am-5:00pm.  BELOW ARE SYMPTOMS THAT SHOULD BE REPORTED IMMEDIATELY:  *FEVER GREATER THAN 101.0 F  *CHILLS WITH OR WITHOUT FEVER  NAUSEA AND VOMITING THAT IS NOT CONTROLLED WITH YOUR NAUSEA MEDICATION  *UNUSUAL SHORTNESS OF BREATH  *UNUSUAL BRUISING OR BLEEDING  TENDERNESS IN MOUTH AND THROAT WITH OR WITHOUT PRESENCE OF ULCERS  *URINARY PROBLEMS  *BOWEL PROBLEMS  UNUSUAL RASH Items with * indicate a potential emergency and should be followed up as soon as possible. If you have an emergency after office hours please contact your primary care physician or go to the nearest emergency department.  Please call the clinic during office hours if you have any questions or concerns.   You may also contact the Patient Navigator at [phone] should you have any questions or need assistance in obtaining follow up care.      Resources For Cancer Patients and their Caregivers ? American Cancer Society: Can assist with transportation, wigs, general needs, runs Look Good Feel Better.        (440)609-0868 ? Cancer Care: Provides financial assistance, online support groups, medication/co-pay assistance.  1-800-813-HOPE 435-053-5901) ? [place] Assists [place] cancer patients and their families through emotional , educational and financial support.  2814768397 ? [place] DSS Where to apply for food stamps, Medicaid and utility assistance. 559-725-5113 ? [facility]: Transportation to medical appointments. 864-803-8536 ? Social Security Administration: May apply for disability if have a Stage IV cancer. 681-504-0772 954-599-3659 ? [employer], Disability and Transit Services: Assists with nutrition, care and transit needs. (681)730-4665

## 2020-01-09 NOTE — Progress Notes (Signed)
[place] reviewed with and pt seen by Dr. Delton Coombes and pt approved for Velcade injection today per MD                                  Carly Wood tolerated Velcade injection well without complaints or incident. Pt discharged via wheelchair in satisfactory condition

## 2020-01-10 ENCOUNTER — Inpatient Hospital Stay (HOSPITAL_COMMUNITY): Payer: Medicare PPO | Attending: Hematology

## 2020-01-10 DIAGNOSIS — N186 End stage renal disease: Secondary | ICD-10-CM | POA: Diagnosis not present

## 2020-01-10 DIAGNOSIS — R2242 Localized swelling, mass and lump, left lower limb: Secondary | ICD-10-CM | POA: Diagnosis not present

## 2020-01-10 DIAGNOSIS — Z23 Encounter for immunization: Secondary | ICD-10-CM | POA: Diagnosis not present

## 2020-01-10 DIAGNOSIS — Z992 Dependence on renal dialysis: Secondary | ICD-10-CM | POA: Diagnosis not present

## 2020-01-10 DIAGNOSIS — M79605 Pain in left leg: Secondary | ICD-10-CM | POA: Diagnosis not present

## 2020-01-10 DIAGNOSIS — M7989 Other specified soft tissue disorders: Secondary | ICD-10-CM | POA: Diagnosis not present

## 2020-01-10 MED ORDER — LORATADINE 10 MG PO TABS
ORAL_TABLET | ORAL | Status: AC
  Filled 2020-01-10: qty 1

## 2020-01-10 MED ORDER — ACETAMINOPHEN 325 MG PO TABS
ORAL_TABLET | ORAL | Status: AC
  Filled 2020-01-10: qty 2

## 2020-01-10 MED ORDER — FAMOTIDINE 20 MG PO TABS
ORAL_TABLET | ORAL | Status: AC
  Filled 2020-01-10: qty 1

## 2020-01-10 MED ORDER — ACETAMINOPHEN 325 MG PO TABS
ORAL_TABLET | ORAL | Status: AC
  Filled 2020-01-10: qty 1

## 2020-01-10 MED ORDER — INFLUENZA VAC A&B SA ADJ QUAD 0.5 ML IM PRSY
PREFILLED_SYRINGE | INTRAMUSCULAR | Status: AC
  Filled 2020-01-10: qty 0.5

## 2020-01-10 NOTE — Progress Notes (Signed)
CRITICAL VALUE ALERT  Critical Value:  K+ 6.4  Date & Time Notied:  01/09/2020 at 1406  Provider Notified: Dr. Delton Coombes  Orders Received/Actions taken:  Recommend low potassium diet and  pt to proceed with hemodialysis as scheduled per MD.

## 2020-01-11 ENCOUNTER — Non-Acute Institutional Stay (SKILLED_NURSING_FACILITY): Payer: Medicare PPO | Admitting: Adult Health

## 2020-01-11 ENCOUNTER — Encounter: Payer: Self-pay | Admitting: Adult Health

## 2020-01-11 DIAGNOSIS — N186 End stage renal disease: Secondary | ICD-10-CM

## 2020-01-11 DIAGNOSIS — E1122 Type 2 diabetes mellitus with diabetic chronic kidney disease: Secondary | ICD-10-CM

## 2020-01-11 NOTE — Progress Notes (Signed)
Location:    [place] Room Number: 148/D Place of Service:  SNF (31)   CODE STATUS: Full Code  Allergies  Allergen Reactions  . Penicillins Other (See Comments)    Unsure of reaction Has patient had a PCN reaction causing immediate rash, facial/tongue/throat swelling, SOB or lightheadedness with hypotension: Unknown Has patient had a PCN reaction causing severe rash involving mucus membranes or skin necrosis: Unknown Has patient had a PCN reaction that required hospitalization: No Has patient had a PCN reaction occurring within the last 10 years: Unknown If all of the above answers are "NO", then may proceed with Cephalosporin use.    . Ace Inhibitors Cough and Other (See Comments)    Tongue swell  . Lisinopril Other (See Comments)    Tongue swell    Chief Complaint  Patient presents with  . Acute Visit    Diabetes    HPI:  She is having lower CBG readings in the AM with lunch time CBG readings elevated. There are no reports of missed doses of insulin. No reports of excessive hunger or thirst.   Past Medical History:  Diagnosis Date  . Anemia   . Anxiety   . Chronic kidney disease   . Chronic renal disease, stage 4, severely decreased glomerular filtration rate (GFR) between 15-29 mL/min/1.73 square meter (HCC) 08/22/2015  . Complication of anesthesia    delirious after Breast Surgery  . Dementia ([place])    mild  . Depression   . Diabetes mellitus without complication ([place])    type II  . Dyspnea    with activity  . GERD (gastroesophageal reflux disease)   . Glaucoma   . Hypertension   . Pneumonia   . Stage 1 infiltrating ductal carcinoma of right female breast ([place]) 08/21/2015   ER+ PR+ HER 2 neu + (3+) T1cN0     Past Surgical History:  Procedure Laterality Date  . AV FISTULA PLACEMENT Left 11/22/2017   Procedure: ARTERIOVENOUS (AV) FISTULA CREATION LEFT ARM;  Surgeon: Elam Dutch, MD;  Location: [place];  Service: Vascular;   Laterality: Left;  . BIOPSY  08/07/2016   Procedure: BIOPSY;  Surgeon: Daneil Dolin, MD;  [facility];  Service: Endoscopy;;  gastric ulcer biopsy  . COLONOSCOPY    . ESOPHAGOGASTRODUODENOSCOPY N/A 08/07/2016   Procedure: ESOPHAGOGASTRODUODENOSCOPY (EGD);  Surgeon: Daneil Dolin, MD;  [facility];  Service: Endoscopy;  Laterality: N/A;  1215-rescheduled to 6/1 @ 2:30pm per Tretha Sciara  . ESOPHAGOGASTRODUODENOSCOPY N/A 11/27/2016   Procedure: ESOPHAGOGASTRODUODENOSCOPY (EGD);  Surgeon: Daneil Dolin, MD;  [facility];  Service: Endoscopy;  Laterality: N/A;  8:15am  . FISTULA SUPERFICIALIZATION Left 02/14/2018   Procedure: FISTULA SUPERFICIALIZATION LEFT ARM;  Surgeon: Angelia Mould, MD;  Location: [place];  Service: Vascular;  Laterality: Left;  . FRACTURE SURGERY Right    ankle  . MALONEY DILATION N/A 08/07/2016   Procedure: Venia Minks DILATION;  Surgeon: Daneil Dolin, MD;  [facility];  Service: Endoscopy;  Laterality: N/A;  . MASTECTOMY, PARTIAL Right   . PERIPHERAL VASCULAR BALLOON ANGIOPLASTY Left 07/13/2019   Procedure: PERIPHERAL VASCULAR BALLOON ANGIOPLASTY;  Surgeon: Marty Heck, MD;  Location: [facility];  Service: Cardiovascular;  Laterality: Left;  arm fistulogram  . PORT-A-CATH REMOVAL Left 11/22/2017   Procedure: REMOVAL PORT-A-CATH LEFT CHEST;  Surgeon: Elam Dutch, MD;  Location: [place];  Service: Vascular;  Laterality: Left;  . RETINAL DETACHMENT  SURGERY Right     Social History   Socioeconomic History  . Marital status: Single    Spouse name: Not on file  . Number of children: Not on file  . Years of education: Not on file  . Highest education level: Not on file  Occupational History  . Occupation: retired   Tobacco Use  . Smoking status: Never Smoker  . Smokeless tobacco: Never Used  Vaping Use  . Vaping Use: Never used  Substance and Sexual Activity  . Alcohol use: No    Alcohol/week: 0.0  standard drinks  . Drug use: No  . Sexual activity: Never  Other Topics Concern  . Not on file  Social History Narrative   Long term resident of SNF    Social Determinants of Health   Financial Resource Strain: Low Risk   . Difficulty of Paying Living Expenses: Not very hard  Food Insecurity: No Food Insecurity  . Worried About Charity fundraiser in the Last Year: Never true  . Ran Out of Food in the Last Year: Never true  Transportation Needs: No Transportation Needs  . Lack of Transportation (Medical): No  . Lack of Transportation (Non-Medical): No  Physical Activity: Inactive  . Days of Exercise per Week: 0 days  . Minutes of Exercise per Session: 0 min  Stress: No Stress Concern Present  . Feeling of Stress : Not at all  Social Connections: Moderately Isolated  . Frequency of Communication with Friends and Family: More than three times a week  . Frequency of Social Gatherings with Friends and Family: Once a week  . Attends Religious Services: More than 4 times per year  . Active Member of Clubs or Organizations: No  . Attends Archivist Meetings: Never  . Marital Status: Never married  Intimate Partner Violence: Not At Risk  . Fear of Current or Ex-Partner: No  . Emotionally Abused: No  . Physically Abused: No  . Sexually Abused: No   Family History  Problem Relation Age of Onset  . Multiple myeloma Sister   . Brain cancer Sister   . Dementia Mother        died at 28  . Stroke Mother   . Heart failure Mother   . Diabetes Mother   . Heart disease Father   . Prostate cancer Brother   . Colon cancer Neg Hx       VITAL SIGNS BP 128/67   Pulse (!) 57   Temp (!) 97.1 F (36.2 C)   Resp 20   Ht _0  (1.6 m)   Wt 144 lb 9.6 oz (65.6 kg)   BMI 25.61 kg/m   Outpatient Encounter Medications as of 01/11/2020  Medication Sig  . acetaminophen (TYLENOL) 500 MG tablet Take 500 mg by mouth every 6 (six) hours as needed for moderate pain or headache.   Marland Kitchen  acyclovir (ZOVIRAX) 200 MG capsule Take 200 mg by mouth daily.  Marland Kitchen amLODipine (NORVASC) 10 MG tablet Take 10 mg by mouth daily. Not taking on dialysis days  . anastrozole (ARIMIDEX) 1 MG tablet TAKE 1 TABLET BY MOUTH DAILY  . aspirin EC 81 MG tablet Take 81 mg by mouth daily.  . calcium acetate, Phos Binder, (PHOSLYRA) 667 MG/5ML SOLN Take 667 mg by mouth daily. Take 1 tablet with snacks daily  . calcium acetate, Phos Binder, (PHOSLYRA) 667 MG/5ML SOLN Take 1,334 mg by mouth 2 (two) times daily with a meal.  . Calcium Carbonate-Vitamin D (  CALCIUM 600+D PO) Take 1 tablet by mouth daily.  . cloNIDine (CATAPRES) 0.3 MG tablet Take 0.3 mg by mouth 2 (two) times daily.   . insulin aspart (NOVOLOG FLEXPEN) 100 UNIT/ML FlexPen Inject 10 Units into the skin 3 (three) times daily with meals.  . Insulin Glargine (BASAGLAR KWIKPEN) 100 UNIT/ML Inject 10 Units into the skin at bedtime.  . melatonin 3 MG TABS tablet Take 6 mg by mouth at bedtime. For Sleep  . Multiple Vitamin (MULTIVITAMIN) capsule Take 1 capsule by mouth daily.  . NON FORMULARY Diet: _____ Regular, ___x___ NAS, _______Consistent Carbohydrate, _______NPO __x___Other Low Potassium  . NON FORMULARY Wanderguard #3310 to ankle for safety awareness. Check placement and function qshift. Every Shift Day, Evening, Night  . Nutritional Supplements (NEPRO PO) Take 1 Can by mouth daily.   No facility-administered encounter medications on file as of 01/11/2020.     SIGNIFICANT DIAGNOSTIC EXAMS   PREVIOUS   01-07-20: left knee x-ray: no acute abnormality   NO NEW EXAMS.    LABS REVIEWED PREVIOUS  10-13-19: hgb a1c 6.0; vit B 12: 1126 folate 20.9 10-17-19: wbc 8.8; hgb 8.3; hct 26.1; mcv 114.0 plt 270; glucose 190; bun 28; creat 4.85; k+ 3.9; na++ 127; ca 8.9 liver normal albumin 4.0  10-31-19: wbc 6.0; hgb 8.8; hct 28.6; mcv 117.2 plt 326; glucose 188; bun 45; creat 6.28; k+ 3.9; na++ 137;ca 9.1 liver normal albumin 3.9 11-07-19: wbc 6.1;  hgb 9.2; hct 29.4 mcv 115.7 plt 276; glucose 201; bun 44; creat 5.93; k+ 4.5; na++ 136; ca 9.1 liver normal albumin 3.8 11-14-19; wbc 8.3; hgb 9.7; hct 31.2 mcv 116.4 plt 261; glucose 198; bun 44; creat 6.66; k+ 5.1; na++ 138; ca 8.8 liver normal albumin 3.9   12-12-19: wbc 7.6; hgb 11.3; hct 35.6; mcv 109.9 plt 212; glucose 602; bun 67; creat 8.62; k+ 5.6; na++ 135; ca 7.9 liver normal albumin 3.5   NO NEW LABS.   Review of Systems  Constitutional: Negative for malaise/fatigue.  Respiratory: Negative for cough and shortness of breath.   Cardiovascular: Negative for chest pain, palpitations and leg swelling.  Gastrointestinal: Negative for abdominal pain, constipation and heartburn.  Musculoskeletal: Negative for back pain, joint pain and myalgias.  Skin: Negative.   Neurological: Negative for dizziness.  Psychiatric/Behavioral: The patient is not nervous/anxious.       Physical Exam Constitutional:      General: She is not in acute distress.    Appearance: She is well-developed. She is not diaphoretic.  Neck:     Thyroid: No thyromegaly.  Cardiovascular:     Rate and Rhythm: Normal rate and regular rhythm.     Pulses: Normal pulses.     Heart sounds: Normal heart sounds.  Pulmonary:     Effort: Pulmonary effort is normal. No respiratory distress.     Breath sounds: Normal breath sounds.  Abdominal:     General: Bowel sounds are normal. There is no distension.     Palpations: Abdomen is soft.     Tenderness: There is no abdominal tenderness.  Musculoskeletal:        General: Normal range of motion.     Cervical back: Neck supple.     Right lower leg: No edema.     Left lower leg: No edema.  Lymphadenopathy:     Cervical: No cervical adenopathy.  Skin:    General: Skin is warm and dry.     Comments: Left arm a/v fistula: + thrill + bruit  Neurological:     Mental Status: She is alert. Mental status is at baseline.  Psychiatric:        Mood and Affect: Mood normal.      ASSESSMENT/ PLAN:  TODAY  1. Diabetes mellitus with ESRD (end stage renal disease)  Her cbgs are not adequately managed Will change to: novolog 10 units with breakfast and supper and 15 units with lunch.  Will monitor her status.   MD is aware of resident's narcotic use and is in agreement with current plan of care. We will attempt to wean resident as appropriate.  Ok Edwards NP [facility]  Contact [phone] Monday through Friday 8am- 5pm  After hours call [phone]

## 2020-01-12 DIAGNOSIS — Z23 Encounter for immunization: Secondary | ICD-10-CM | POA: Diagnosis not present

## 2020-01-12 DIAGNOSIS — Z992 Dependence on renal dialysis: Secondary | ICD-10-CM | POA: Diagnosis not present

## 2020-01-12 DIAGNOSIS — M25562 Pain in left knee: Secondary | ICD-10-CM | POA: Insufficient documentation

## 2020-01-12 DIAGNOSIS — N186 End stage renal disease: Secondary | ICD-10-CM | POA: Diagnosis not present

## 2020-01-15 DIAGNOSIS — M8588 Other specified disorders of bone density and structure, other site: Secondary | ICD-10-CM | POA: Diagnosis not present

## 2020-01-15 DIAGNOSIS — R2689 Other abnormalities of gait and mobility: Secondary | ICD-10-CM | POA: Diagnosis not present

## 2020-01-15 DIAGNOSIS — Z23 Encounter for immunization: Secondary | ICD-10-CM | POA: Diagnosis not present

## 2020-01-15 DIAGNOSIS — M6281 Muscle weakness (generalized): Secondary | ICD-10-CM | POA: Diagnosis not present

## 2020-01-15 DIAGNOSIS — C9000 Multiple myeloma not having achieved remission: Secondary | ICD-10-CM | POA: Diagnosis not present

## 2020-01-15 DIAGNOSIS — Z992 Dependence on renal dialysis: Secondary | ICD-10-CM | POA: Diagnosis not present

## 2020-01-15 DIAGNOSIS — M79671 Pain in right foot: Secondary | ICD-10-CM | POA: Diagnosis not present

## 2020-01-15 DIAGNOSIS — R262 Difficulty in walking, not elsewhere classified: Secondary | ICD-10-CM | POA: Diagnosis not present

## 2020-01-15 DIAGNOSIS — M25561 Pain in right knee: Secondary | ICD-10-CM | POA: Diagnosis not present

## 2020-01-15 DIAGNOSIS — M25571 Pain in right ankle and joints of right foot: Secondary | ICD-10-CM | POA: Diagnosis not present

## 2020-01-15 DIAGNOSIS — N186 End stage renal disease: Secondary | ICD-10-CM | POA: Diagnosis not present

## 2020-01-15 DIAGNOSIS — Z9181 History of falling: Secondary | ICD-10-CM | POA: Diagnosis not present

## 2020-01-16 DIAGNOSIS — Z9181 History of falling: Secondary | ICD-10-CM | POA: Diagnosis not present

## 2020-01-16 DIAGNOSIS — M6281 Muscle weakness (generalized): Secondary | ICD-10-CM | POA: Diagnosis not present

## 2020-01-16 DIAGNOSIS — C9000 Multiple myeloma not having achieved remission: Secondary | ICD-10-CM | POA: Diagnosis not present

## 2020-01-16 DIAGNOSIS — R262 Difficulty in walking, not elsewhere classified: Secondary | ICD-10-CM | POA: Diagnosis not present

## 2020-01-17 DIAGNOSIS — Z23 Encounter for immunization: Secondary | ICD-10-CM | POA: Diagnosis not present

## 2020-01-17 DIAGNOSIS — Z992 Dependence on renal dialysis: Secondary | ICD-10-CM | POA: Diagnosis not present

## 2020-01-17 DIAGNOSIS — M6281 Muscle weakness (generalized): Secondary | ICD-10-CM | POA: Diagnosis not present

## 2020-01-17 DIAGNOSIS — N186 End stage renal disease: Secondary | ICD-10-CM | POA: Diagnosis not present

## 2020-01-17 DIAGNOSIS — R262 Difficulty in walking, not elsewhere classified: Secondary | ICD-10-CM | POA: Diagnosis not present

## 2020-01-17 DIAGNOSIS — C9000 Multiple myeloma not having achieved remission: Secondary | ICD-10-CM | POA: Diagnosis not present

## 2020-01-17 DIAGNOSIS — Z9181 History of falling: Secondary | ICD-10-CM | POA: Diagnosis not present

## 2020-01-18 ENCOUNTER — Non-Acute Institutional Stay (SKILLED_NURSING_FACILITY): Payer: Medicare PPO | Admitting: Adult Health

## 2020-01-18 ENCOUNTER — Encounter: Payer: Self-pay | Admitting: Adult Health

## 2020-01-18 DIAGNOSIS — C50911 Malignant neoplasm of unspecified site of right female breast: Secondary | ICD-10-CM | POA: Diagnosis not present

## 2020-01-18 DIAGNOSIS — C9000 Multiple myeloma not having achieved remission: Secondary | ICD-10-CM

## 2020-01-18 DIAGNOSIS — Z9181 History of falling: Secondary | ICD-10-CM | POA: Diagnosis not present

## 2020-01-18 DIAGNOSIS — R937 Abnormal findings on diagnostic imaging of other parts of musculoskeletal system: Secondary | ICD-10-CM | POA: Diagnosis not present

## 2020-01-18 DIAGNOSIS — N186 End stage renal disease: Secondary | ICD-10-CM

## 2020-01-18 DIAGNOSIS — R262 Difficulty in walking, not elsewhere classified: Secondary | ICD-10-CM | POA: Diagnosis not present

## 2020-01-18 DIAGNOSIS — M6281 Muscle weakness (generalized): Secondary | ICD-10-CM | POA: Diagnosis not present

## 2020-01-18 DIAGNOSIS — D631 Anemia in chronic kidney disease: Secondary | ICD-10-CM | POA: Diagnosis not present

## 2020-01-18 DIAGNOSIS — M810 Age-related osteoporosis without current pathological fracture: Secondary | ICD-10-CM | POA: Diagnosis not present

## 2020-01-18 NOTE — Progress Notes (Signed)
Location:    [place] Room Number: 148/D Place of Service:  SNF (31)   CODE STATUS: Full Code  Allergies  Allergen Reactions   Penicillins Other (See Comments)    Unsure of reaction Has patient had a PCN reaction causing immediate rash, facial/tongue/throat swelling, SOB or lightheadedness with hypotension: Unknown Has patient had a PCN reaction causing severe rash involving mucus membranes or skin necrosis: Unknown Has patient had a PCN reaction that required hospitalization: No Has patient had a PCN reaction occurring within the last 10 years: Unknown If all of the above answers are "NO", then may proceed with Cephalosporin use.     Ace Inhibitors Cough and Other (See Comments)    Tongue swell   Lisinopril Other (See Comments)    Tongue swell    Chief Complaint  Patient presents with   Medical Management of Chronic Issues            Stage 1 infiltrating ductal carcinoma of right female breast:  Multiple myeloma Anemia due to end stage renal disease:     HPI:  She is a 84 year old long term resident of this facility being seen for the management of her chronic illnesses: Stage 1 infiltrating ductal carcinoma of right female breast:  Multiple myeloma Anemia due to end stage renal disease. There are no reports of uncontrolled pain; no reports of increased weakness; no reports of agitation or insomnia.   Past Medical History:  Diagnosis Date   Anemia    Anxiety    Chronic kidney disease    Chronic renal disease, stage 4, severely decreased glomerular filtration rate (GFR) between 15-29 mL/min/1.73 square meter ([place]) 2/53/6644   Complication of anesthesia    delirious after Breast Surgery   Dementia ([place])    mild   Depression    Diabetes mellitus without complication ([place])    type II   Dyspnea    with activity   GERD (gastroesophageal reflux disease)    Glaucoma    Hypertension    Pneumonia    Stage 1 infiltrating ductal  carcinoma of right female breast ([place]) 08/21/2015   ER+ PR+ HER 2 neu + (3+) T1cN0     Past Surgical History:  Procedure Laterality Date   AV FISTULA PLACEMENT Left 11/22/2017   Procedure: ARTERIOVENOUS (AV) FISTULA CREATION LEFT ARM;  Surgeon: Elam Dutch, MD;  Location: [place];  Service: Vascular;  Laterality: Left;   BIOPSY  08/07/2016   Procedure: BIOPSY;  Surgeon: Daneil Dolin, MD;  [facility];  Service: Endoscopy;;  gastric ulcer biopsy   COLONOSCOPY     ESOPHAGOGASTRODUODENOSCOPY N/A 08/07/2016   Procedure: ESOPHAGOGASTRODUODENOSCOPY (EGD);  Surgeon: Daneil Dolin, MD;  [facility];  Service: Endoscopy;  Laterality: N/A;  1215-rescheduled to 6/1 @ 2:30pm per Tretha Sciara   ESOPHAGOGASTRODUODENOSCOPY N/A 11/27/2016   Procedure: ESOPHAGOGASTRODUODENOSCOPY (EGD);  Surgeon: Daneil Dolin, MD;  [facility];  Service: Endoscopy;  Laterality: N/A;  8:15am   FISTULA SUPERFICIALIZATION Left 02/14/2018   Procedure: FISTULA SUPERFICIALIZATION LEFT ARM;  Surgeon: Angelia Mould, MD;  Location: [place];  Service: Vascular;  Laterality: Left;   FRACTURE SURGERY Right    ankle   MALONEY DILATION N/A 08/07/2016   Procedure: Venia Minks DILATION;  Surgeon: Daneil Dolin, MD;  [facility];  Service: Endoscopy;  Laterality: N/A;   MASTECTOMY, PARTIAL Right    PERIPHERAL VASCULAR BALLOON ANGIOPLASTY Left 07/13/2019   Procedure: PERIPHERAL  VASCULAR BALLOON ANGIOPLASTY;  Surgeon: Marty Heck, MD;  Location: [place] [facility];  Service: Cardiovascular;  Laterality: Left;  arm fistulogram   PORT-A-CATH REMOVAL Left 11/22/2017   Procedure: REMOVAL PORT-A-CATH LEFT CHEST;  Surgeon: Elam Dutch, MD;  Location: [facility];  Service: Vascular;  Laterality: Left;   RETINAL DETACHMENT SURGERY Right     Social History   Socioeconomic History   Marital status: Single    Spouse name: Not on file   Number of children: Not on file   Years  of education: Not on file   Highest education level: Not on file  Occupational History   Occupation: retired   Tobacco Use   Smoking status: Never Smoker   Smokeless tobacco: Never Used  Scientific laboratory technician Use: Never used  Substance and Sexual Activity   Alcohol use: No    Alcohol/week: 0.0 standard drinks   Drug use: No   Sexual activity: Never  Other Topics Concern   Not on file  Social History Narrative   Long term resident of SNF    Social Determinants of Health   Financial Resource Strain: Low Risk    Difficulty of Paying Living Expenses: Not very hard  Food Insecurity: No Food Insecurity   Worried About Charity fundraiser in the Last Year: Never true   [place] in the Last Year: Never true  Transportation Needs: No Transportation Needs   Lack of Transportation (Medical): No   Lack of Transportation (Non-Medical): No  Physical Activity: Inactive   Days of Exercise per Week: 0 days   Minutes of Exercise per Session: 0 min  Stress: No Stress Concern Present   Feeling of Stress : Not at all  Social Connections: Moderately Isolated   Frequency of Communication with Friends and Family: More than three times a week   Frequency of Social Gatherings with Friends and Family: Once a week   Attends Religious Services: More than 4 times per year   Active Member of [employer] or Organizations: No   Attends Music therapist: Never   Marital Status: Never married  Human resources officer Violence: Not At Risk   Fear of Current or Ex-Partner: No   Emotionally Abused: No   Physically Abused: No   Sexually Abused: No   Family History  Problem Relation Age of Onset   Multiple myeloma Sister    Brain cancer Sister    Dementia Mother        died at 76   Stroke Mother    Heart failure Mother    Diabetes Mother    Heart disease Father    Prostate cancer Brother    Colon cancer Neg Hx       VITAL SIGNS BP (!) 173/77    Pulse  68    Temp 98.1 F (36.7 C)    Resp 20    Ht '5\' 3"'  (1.6 m)    Wt 144 lb 9.6 oz (65.6 kg)    BMI 25.61 kg/m   Outpatient Encounter Medications as of 01/18/2020  Medication Sig   acetaminophen (TYLENOL) 650 MG CR tablet Take 650 mg by mouth every 6 (six) hours.   acyclovir (ZOVIRAX) 200 MG capsule Take 200 mg by mouth daily.   amLODipine (NORVASC) 10 MG tablet Take 10 mg by mouth daily. Not taking on dialysis days   anastrozole (ARIMIDEX) 1 MG tablet TAKE 1 TABLET BY MOUTH DAILY   aspirin EC 81 MG tablet  Take 81 mg by mouth daily.   calcium acetate, Phos Binder, (PHOSLYRA) 667 MG/5ML SOLN Take 667 mg by mouth daily. Take 1 tablet with snacks daily   calcium acetate, Phos Binder, (PHOSLYRA) 667 MG/5ML SOLN Take 1,334 mg by mouth 2 (two) times daily with a meal.   Calcium Carbonate-Vitamin D (CALCIUM 600+D PO) Take 1 tablet by mouth daily.   cloNIDine (CATAPRES) 0.3 MG tablet Take 0.3 mg by mouth 2 (two) times daily.    insulin aspart (NOVOLOG FLEXPEN) 100 UNIT/ML FlexPen Inject 10 Units into the skin in the morning and at supper .    insulin aspart (NOVOLOG FLEXPEN) 100 UNIT/ML FlexPen Inject 15 Units into the skin daily.   Insulin Glargine (BASAGLAR KWIKPEN) 100 UNIT/ML Inject 10 Units into the skin at bedtime.   melatonin 3 MG TABS tablet Take 6 mg by mouth at bedtime. For Sleep   Multiple Vitamin (MULTIVITAMIN) capsule Take 1 capsule by mouth daily.   NON FORMULARY Diet: _____ Regular, ___x___ NAS, _______Consistent Carbohydrate, _______NPO __x___Other Low Potassium   NON FORMULARY Wanderguard #3310 to ankle for safety awareness. Check placement and function qshift. Every Shift Day, Evening, Night   Nutritional Supplements (NEPRO PO) Take 1 Can by mouth daily.   [DISCONTINUED] acetaminophen (TYLENOL) 500 MG tablet Take 500 mg by mouth every 6 (six) hours as needed for moderate pain or headache.    No facility-administered encounter medications on file as of  01/18/2020.     SIGNIFICANT DIAGNOSTIC EXAMS  LABS REVIEWED PREVIOUS  10-13-19: hgb a1c 6.0; vit B 12: 1126 folate 20.9 10-17-19: wbc 8.8; hgb 8.3; hct 26.1; mcv 114.0 plt 270; glucose 190; bun 28; creat 4.85; k+ 3.9; na++ 127; ca 8.9 liver normal albumin 4.0  10-31-19: wbc 6.0; hgb 8.8; hct 28.6; mcv 117.2 plt 326; glucose 188; bun 45; creat 6.28; k+ 3.9; na++ 137;ca 9.1 liver normal albumin 3.9 11-07-19: wbc 6.1; hgb 9.2; hct 29.4 mcv 115.7 plt 276; glucose 201; bun 44; creat 5.93; k+ 4.5; na++ 136; ca 9.1 liver normal albumin 3.8 11-14-19; wbc 8.3; hgb 9.7; hct 31.2 mcv 116.4 plt 261; glucose 198; bun 44; creat 6.66; k+ 5.1; na++ 138; ca 8.8 liver normal albumin 3.9   12-12-19: wbc 7.6; hgb 11.3; hct 35.6; mcv 109.9 plt 212; glucose 602; bun 67; creat 8.62; k+ 5.6; na++ 135; ca 7.9 liver normal albumin 3.5   TODAY  01-09-20: wbc 6.9; hgb 12.1; hct 39.7; mcv 105.6 plt 299; glucose 225; bun 112; creat 14.53; k+ 6.4; na++ 132; ca 8.3 liver normal albumin 3.5   Review of Systems  Constitutional: Negative for malaise/fatigue.  Respiratory: Negative for cough and shortness of breath.   Cardiovascular: Negative for chest pain, palpitations and leg swelling.  Gastrointestinal: Negative for abdominal pain, constipation and heartburn.  Musculoskeletal: Negative for back pain, joint pain and myalgias.  Skin: Negative.   Neurological: Negative for dizziness.  Psychiatric/Behavioral: The patient is not nervous/anxious.       Physical Exam Constitutional:      General: She is not in acute distress.    Appearance: She is well-developed. She is not diaphoretic.  Neck:     Thyroid: No thyromegaly.  Cardiovascular:     Rate and Rhythm: Normal rate and regular rhythm.     Pulses: Normal pulses.     Heart sounds: Normal heart sounds.  Pulmonary:     Effort: Pulmonary effort is normal. No respiratory distress.     Breath sounds: Normal breath sounds.  Abdominal:  General: Bowel sounds are normal.  There is no distension.     Palpations: Abdomen is soft.     Tenderness: There is no abdominal tenderness.  Musculoskeletal:        General: Normal range of motion.     Cervical back: Neck supple.     Right lower leg: No edema.     Left lower leg: No edema.  Lymphadenopathy:     Cervical: No cervical adenopathy.  Skin:    General: Skin is warm and dry.     Comments:  Left arm a/v fistula: + thrill + bruit      Neurological:     Mental Status: She is alert. Mental status is at baseline.  Psychiatric:        Mood and Affect: Mood normal.      ASSESSMENT/ PLAN:  TODAY  1. Stage 1 infiltrating ductal carcinoma of right female breast: is stable is followed by oncology; will continue armidex 1 mg daily   2. Multiple myeloma no achieving remission: is without change is followed by oncology  3. Anemia due to end stage renal disease: is stable hgb 12.1 will monitor    PREVIOUS   4. Herpes: no reports of outbreaks will continue acyclovir 200 mg  daily   5. Vascular dementia with behavioral disturbance: is without change weight is 144 pounds will monitor   6. Diabetes with end stage renal disease (ESRD) is stable hgb a1c 8.0 will continue novolog 10 units with breakfast and supper; 15 units at lunch; basaglar 10 units nightly   7. Hyperkalemia: k+ 6.4 has been elevated at dialysis at 6.8 for the past 2 weeks. Will continue  diet  low potassium   8. Chronic kidney disease with end stage renal disease on dialysis due to type 2 diabetes mellitus/dependence on dialysis; will continue dialysis three times weekly will continue phoshyra 667 mg 2 tabs with meals one with snacks.   9. Hypertension; due to end stage renal disease due to type 2 diabetes mellitus: is stable b/p 173/77 will continue norvasc 10 mg four times weekly and clonidine 0.3 mg twice daily asa 81 mg daily     MD is aware of resident's narcotic use and is in agreement with current plan of care. We will attempt to wean  resident as appropriate.  Ok Edwards NP [facility]  Contact [phone] Monday through Friday 8am- 5pm  After hours call [phone]

## 2020-01-19 DIAGNOSIS — Z794 Long term (current) use of insulin: Secondary | ICD-10-CM | POA: Diagnosis not present

## 2020-01-19 DIAGNOSIS — C9000 Multiple myeloma not having achieved remission: Secondary | ICD-10-CM | POA: Diagnosis not present

## 2020-01-19 DIAGNOSIS — M6281 Muscle weakness (generalized): Secondary | ICD-10-CM | POA: Diagnosis not present

## 2020-01-19 DIAGNOSIS — E1151 Type 2 diabetes mellitus with diabetic peripheral angiopathy without gangrene: Secondary | ICD-10-CM | POA: Diagnosis not present

## 2020-01-19 DIAGNOSIS — Z9181 History of falling: Secondary | ICD-10-CM | POA: Diagnosis not present

## 2020-01-19 DIAGNOSIS — Z23 Encounter for immunization: Secondary | ICD-10-CM | POA: Diagnosis not present

## 2020-01-19 DIAGNOSIS — B351 Tinea unguium: Secondary | ICD-10-CM | POA: Diagnosis not present

## 2020-01-19 DIAGNOSIS — N186 End stage renal disease: Secondary | ICD-10-CM | POA: Diagnosis not present

## 2020-01-19 DIAGNOSIS — M2041 Other hammer toe(s) (acquired), right foot: Secondary | ICD-10-CM | POA: Diagnosis not present

## 2020-01-19 DIAGNOSIS — Z992 Dependence on renal dialysis: Secondary | ICD-10-CM | POA: Diagnosis not present

## 2020-01-19 DIAGNOSIS — R262 Difficulty in walking, not elsewhere classified: Secondary | ICD-10-CM | POA: Diagnosis not present

## 2020-01-19 DIAGNOSIS — L603 Nail dystrophy: Secondary | ICD-10-CM | POA: Diagnosis not present

## 2020-01-22 DIAGNOSIS — M6281 Muscle weakness (generalized): Secondary | ICD-10-CM | POA: Diagnosis not present

## 2020-01-22 DIAGNOSIS — R262 Difficulty in walking, not elsewhere classified: Secondary | ICD-10-CM | POA: Diagnosis not present

## 2020-01-22 DIAGNOSIS — Z992 Dependence on renal dialysis: Secondary | ICD-10-CM | POA: Diagnosis not present

## 2020-01-22 DIAGNOSIS — Z23 Encounter for immunization: Secondary | ICD-10-CM | POA: Diagnosis not present

## 2020-01-22 DIAGNOSIS — N186 End stage renal disease: Secondary | ICD-10-CM | POA: Diagnosis not present

## 2020-01-22 DIAGNOSIS — C9000 Multiple myeloma not having achieved remission: Secondary | ICD-10-CM | POA: Diagnosis not present

## 2020-01-22 DIAGNOSIS — Z9181 History of falling: Secondary | ICD-10-CM | POA: Diagnosis not present

## 2020-01-23 ENCOUNTER — Other Ambulatory Visit (HOSPITAL_COMMUNITY)
Admission: RE | Admit: 2020-01-23 | Discharge: 2020-01-23 | Disposition: A | Discharge status: Home / Self Care | Payer: Medicare PPO | Source: Skilled Nursing Facility | Attending: Adult Health | Admitting: Adult Health

## 2020-01-23 ENCOUNTER — Encounter (HOSPITAL_COMMUNITY): Payer: Self-pay

## 2020-01-23 ENCOUNTER — Non-Acute Institutional Stay (SKILLED_NURSING_FACILITY): Payer: Medicare PPO | Admitting: Adult Health

## 2020-01-23 ENCOUNTER — Encounter: Payer: Self-pay | Admitting: Adult Health

## 2020-01-23 ENCOUNTER — Inpatient Hospital Stay (HOSPITAL_COMMUNITY): Payer: Medicare PPO

## 2020-01-23 VITALS — BP 128/62 | HR 101 | Temp 97.2°F | Resp 18

## 2020-01-23 DIAGNOSIS — Z9181 History of falling: Secondary | ICD-10-CM | POA: Diagnosis not present

## 2020-01-23 DIAGNOSIS — G479 Sleep disorder, unspecified: Secondary | ICD-10-CM | POA: Diagnosis not present

## 2020-01-23 DIAGNOSIS — N186 End stage renal disease: Secondary | ICD-10-CM

## 2020-01-23 DIAGNOSIS — C9000 Multiple myeloma not having achieved remission: Secondary | ICD-10-CM | POA: Insufficient documentation

## 2020-01-23 DIAGNOSIS — Z5111 Encounter for antineoplastic chemotherapy: Secondary | ICD-10-CM | POA: Diagnosis not present

## 2020-01-23 DIAGNOSIS — Z992 Dependence on renal dialysis: Secondary | ICD-10-CM

## 2020-01-23 DIAGNOSIS — R262 Difficulty in walking, not elsewhere classified: Secondary | ICD-10-CM | POA: Diagnosis not present

## 2020-01-23 DIAGNOSIS — Z170 Estrogen receptor positive status [ER+]: Secondary | ICD-10-CM | POA: Diagnosis not present

## 2020-01-23 DIAGNOSIS — E1122 Type 2 diabetes mellitus with diabetic chronic kidney disease: Secondary | ICD-10-CM | POA: Diagnosis not present

## 2020-01-23 DIAGNOSIS — C50411 Malignant neoplasm of upper-outer quadrant of right female breast: Secondary | ICD-10-CM | POA: Diagnosis not present

## 2020-01-23 DIAGNOSIS — E875 Hyperkalemia: Secondary | ICD-10-CM | POA: Diagnosis not present

## 2020-01-23 DIAGNOSIS — Z79811 Long term (current) use of aromatase inhibitors: Secondary | ICD-10-CM | POA: Diagnosis not present

## 2020-01-23 DIAGNOSIS — M6281 Muscle weakness (generalized): Secondary | ICD-10-CM | POA: Diagnosis not present

## 2020-01-23 LAB — CBC WITH DIFFERENTIAL/PLATELET
Abs Immature Granulocytes: 0.03 10*3/uL (ref 0.00–0.07)
Basophils Absolute: 0 10*3/uL (ref 0.0–0.1)
Basophils Relative: 0 %
Eosinophils Absolute: 0.2 10*3/uL (ref 0.0–0.5)
Eosinophils Relative: 3 %
HCT: 36.9 % (ref 36.0–46.0)
Hemoglobin: 11.6 g/dL — ABNORMAL LOW (ref 12.0–15.0)
Immature Granulocytes: 0 %
Lymphocytes Relative: 18 %
Lymphs Abs: 1.4 10*3/uL (ref 0.7–4.0)
MCH: 32.6 pg (ref 26.0–34.0)
MCHC: 31.4 g/dL (ref 30.0–36.0)
MCV: 103.7 fL — ABNORMAL HIGH (ref 80.0–100.0)
Monocytes Absolute: 0.5 10*3/uL (ref 0.1–1.0)
Monocytes Relative: 6 %
Neutro Abs: 5.7 10*3/uL (ref 1.7–7.7)
Neutrophils Relative %: 73 %
Platelets: 359 10*3/uL (ref 150–400)
RBC: 3.56 MIL/uL — ABNORMAL LOW (ref 3.87–5.11)
RDW: 15.9 % — ABNORMAL HIGH (ref 11.5–15.5)
WBC: 7.9 10*3/uL (ref 4.0–10.5)
nRBC: 0 % (ref 0.0–0.2)

## 2020-01-23 LAB — COMPREHENSIVE METABOLIC PANEL
ALT: 13 U/L (ref 0–44)
AST: 13 U/L — ABNORMAL LOW (ref 15–41)
Albumin: 4.1 g/dL (ref 3.5–5.0)
Alkaline Phosphatase: 95 U/L (ref 38–126)
Anion gap: >20 — ABNORMAL HIGH (ref 5–15)
BUN: 109 mg/dL — ABNORMAL HIGH (ref 8–23)
CO2: 22 mmol/L (ref 22–32)
Calcium: 8.7 mg/dL — ABNORMAL LOW (ref 8.9–10.3)
Chloride: 95 mmol/L — ABNORMAL LOW (ref 98–111)
Creatinine, Ser: 15.46 mg/dL — ABNORMAL HIGH (ref 0.44–1.00)
GFR, Estimated: 2 mL/min — ABNORMAL LOW (ref >60)
Glucose, Bld: 139 mg/dL — ABNORMAL HIGH (ref 70–99)
Potassium: 6.1 mmol/L — ABNORMAL HIGH (ref 3.5–5.1)
Sodium: 140 mmol/L (ref 135–145)
Total Bilirubin: 0.6 mg/dL (ref 0.3–1.2)
Total Protein: 7.4 g/dL (ref 6.5–8.1)

## 2020-01-23 LAB — POTASSIUM: Potassium: 6.6 mmol/L (ref 3.5–5.1)

## 2020-01-23 MED ORDER — PROCHLORPERAZINE MALEATE 10 MG PO TABS
10.0000 mg | ORAL_TABLET | Freq: Once | ORAL | Status: AC
  Administered 2020-01-23: 10 mg via ORAL
  Filled 2020-01-23: qty 1

## 2020-01-23 MED ORDER — DIPHENHYDRAMINE HCL 50 MG/ML IJ SOLN
INTRAMUSCULAR | Status: AC
  Filled 2020-01-23: qty 1

## 2020-01-23 MED ORDER — DEXAMETHASONE 4 MG PO TABS
10.0000 mg | ORAL_TABLET | Freq: Once | ORAL | Status: AC
  Administered 2020-01-23: 10 mg via ORAL
  Filled 2020-01-23: qty 3

## 2020-01-23 MED ORDER — BORTEZOMIB CHEMO SQ INJECTION 3.5 MG (2.5MG/ML)
1.3000 mg/m2 | Freq: Once | INTRAMUSCULAR | Status: AC
  Administered 2020-01-23: 2.25 mg via SUBCUTANEOUS
  Filled 2020-01-23: qty 0.9

## 2020-01-23 MED ORDER — ACETAMINOPHEN 325 MG PO TABS
ORAL_TABLET | ORAL | Status: AC
  Filled 2020-01-23: qty 2

## 2020-01-23 NOTE — Patient Instructions (Signed)
[facility] Discharge Instructions for Patients Receiving Chemotherapy   Beginning January 23rd 2017 lab work for the Cancer Center will be done in the  Main lab at [facility] on 1st floor. If you have a lab appointment with the Cancer Center please come in thru the  Main Entrance and check in at the main information desk   Today you received the following chemotherapy agents Velcade injection. Follow-up as scheduled  To help prevent nausea and vomiting after your treatment, we encourage you to take your nausea medication   If you develop nausea and vomiting, or diarrhea that is not controlled by your medication, call the clinic.  The clinic phone number is [phone]. Office hours are Monday-Friday 8:30am-5:00pm.  BELOW ARE SYMPTOMS THAT SHOULD BE REPORTED IMMEDIATELY:  *FEVER GREATER THAN 101.0 F  *CHILLS WITH OR WITHOUT FEVER  NAUSEA AND VOMITING THAT IS NOT CONTROLLED WITH YOUR NAUSEA MEDICATION  *UNUSUAL SHORTNESS OF BREATH  *UNUSUAL BRUISING OR BLEEDING  TENDERNESS IN MOUTH AND THROAT WITH OR WITHOUT PRESENCE OF ULCERS  *URINARY PROBLEMS  *BOWEL PROBLEMS  UNUSUAL RASH Items with * indicate a potential emergency and should be followed up as soon as possible. If you have an emergency after office hours please contact your primary care physician or go to the nearest emergency department.  Please call the clinic during office hours if you have any questions or concerns.   You may also contact the Patient Navigator at [phone] should you have any questions or need assistance in obtaining follow up care.      Resources For Cancer Patients and their Caregivers ? American Cancer Society: Can assist with transportation, wigs, general needs, runs Look Good Feel Better.        1-888-227-6333 ? Cancer Care: Provides financial assistance, online support groups, medication/co-pay assistance.  1-800-813-HOPE (4673) ? [facility] Assists [place] cancer patients and their families through emotional , educational and financial support.  336-427-4357 ? [place] DSS Where to apply for food stamps, Medicaid and utility assistance. 336-342-1394 ? [facility]: Transportation to medical appointments. 336-347-2287 ? Social Security Administration: May apply for disability if have a Stage IV cancer. 336-342-7796 1-800-772-1213 ? [place] Aging, Disability and Transit Services: Assists with nutrition, care and transit needs. 336-349-2343         

## 2020-01-23 NOTE — Progress Notes (Signed)
1400 CBCD results reviewed with Dr. Delton Coombes and pt approved for Velcade injection today per MD. CMET results not needed since pt had dialysis today per MD                                    Carly Wood tolerated Velcade injection well without complaints or incident. Pt did have an episode of diarrhea and was cleaned up and reported to [facility]. VSS Pt discharged via wheelchair in satisfactory condition accompanied by [facility] employees

## 2020-01-23 NOTE — Progress Notes (Signed)
Location:    [place] Room Number: 148-D Place of Service:  SNF (31)   CODE STATUS: Full Code  Allergies  Allergen Reactions  . Penicillins Other (See Comments)    Unsure of reaction Has patient had a PCN reaction causing immediate rash, facial/tongue/throat swelling, SOB or lightheadedness with hypotension: Unknown Has patient had a PCN reaction causing severe rash involving mucus membranes or skin necrosis: Unknown Has patient had a PCN reaction that required hospitalization: No Has patient had a PCN reaction occurring within the last 10 years: Unknown If all of the above answers are "NO", then may proceed with Cephalosporin use.    . Ace Inhibitors Cough and Other (See Comments)    Tongue swell  . Lisinopril Other (See Comments)    Tongue swell    Chief Complaint  Patient presents with  . Acute Visit    Hyperkalemia     HPI:  Her k+ level on 01-09-20 was 6.4.  She does have ESRD and is followed by nephrology. I have spoken with dialysis regarding her lab results. She will need kayexalate to resolve her hyperkalemia. Dialysis center is in agreement with this coarse of treatment. There are no reports of uncontrolled pain; no reports of weakness present. No reports of changes in appetite; is on low k+ diet.     Past Medical History:  Diagnosis Date  . Anemia   . Anxiety   . Chronic kidney disease   . Chronic renal disease, stage 4, severely decreased glomerular filtration rate (GFR) between 15-29 mL/min/1.73 square meter (HCC) 08/22/2015  . Complication of anesthesia    delirious after Breast Surgery  . Dementia ([place])    mild  . Depression   . Diabetes mellitus without complication ([place])    type II  . Dyspnea    with activity  . GERD (gastroesophageal reflux disease)   . Glaucoma   . Hypertension   . Pneumonia   . Stage 1 infiltrating ductal carcinoma of right female breast ([place]) 08/21/2015   ER+ PR+ HER 2 neu + (3+) T1cN0     Past  Surgical History:  Procedure Laterality Date  . AV FISTULA PLACEMENT Left 11/22/2017   Procedure: ARTERIOVENOUS (AV) FISTULA CREATION LEFT ARM;  Surgeon: Elam Dutch, MD;  Location: [facility];  Service: Vascular;  Laterality: Left;  . BIOPSY  08/07/2016   Procedure: BIOPSY;  Surgeon: Daneil Dolin, MD;  [facility];  Service: Endoscopy;;  gastric ulcer biopsy  . COLONOSCOPY    . ESOPHAGOGASTRODUODENOSCOPY N/A 08/07/2016   Procedure: ESOPHAGOGASTRODUODENOSCOPY (EGD);  Surgeon: Daneil Dolin, MD;  [facility];  Service: Endoscopy;  Laterality: N/A;  1215-rescheduled to 6/1 @ 2:30pm per Tretha Sciara  . ESOPHAGOGASTRODUODENOSCOPY N/A 11/27/2016   Procedure: ESOPHAGOGASTRODUODENOSCOPY (EGD);  Surgeon: Daneil Dolin, MD;  [facility];  Service: Endoscopy;  Laterality: N/A;  8:15am  . FISTULA SUPERFICIALIZATION Left 02/14/2018   Procedure: FISTULA SUPERFICIALIZATION LEFT ARM;  Surgeon: Angelia Mould, MD;  Location: [facility];  Service: Vascular;  Laterality: Left;  . FRACTURE SURGERY Right    ankle  . MALONEY DILATION N/A 08/07/2016   Procedure: Venia Minks DILATION;  Surgeon: Daneil Dolin, MD;  [facility];  Service: Endoscopy;  Laterality: N/A;  . MASTECTOMY, PARTIAL Right   . PERIPHERAL VASCULAR BALLOON ANGIOPLASTY Left 07/13/2019   Procedure: PERIPHERAL VASCULAR BALLOON ANGIOPLASTY;  Surgeon: Marty Heck, MD;  Location: [facility];  Service: Cardiovascular;  Laterality: Left;  arm fistulogram  . PORT-A-CATH REMOVAL Left 11/22/2017   Procedure: REMOVAL PORT-A-CATH LEFT CHEST;  Surgeon: Elam Dutch, MD;  Location: [facility] OR;  Service: Vascular;  Laterality: Left;  . RETINAL DETACHMENT SURGERY Right     Social History   Socioeconomic History  . Marital status: Single    Spouse name: Not on file  . Number of children: Not on file  . Years of education: Not on file  . Highest education level: Not on file  Occupational History  .  Occupation: retired   Tobacco Use  . Smoking status: Never Smoker  . Smokeless tobacco: Never Used  Vaping Use  . Vaping Use: Never used  Substance and Sexual Activity  . Alcohol use: No    Alcohol/week: 0.0 standard drinks  . Drug use: No  . Sexual activity: Never  Other Topics Concern  . Not on file  Social History Narrative   Long term resident of SNF    Social Determinants of Health   Financial Resource Strain: Low Risk   . Difficulty of Paying Living Expenses: Not very hard  Food Insecurity: No Food Insecurity  . Worried About Charity fundraiser in the Last Year: Never true  . Ran Out of Food in the Last Year: Never true  Transportation Needs: No Transportation Needs  . Lack of Transportation (Medical): No  . Lack of Transportation (Non-Medical): No  Physical Activity: Inactive  . Days of Exercise per Week: 0 days  . Minutes of Exercise per Session: 0 min  Stress: No Stress Concern Present  . Feeling of Stress : Not at all  Social Connections: Moderately Isolated  . Frequency of Communication with Friends and Family: More than three times a week  . Frequency of Social Gatherings with Friends and Family: Once a week  . Attends Religious Services: More than 4 times per year  . Active Member of Clubs or Organizations: No  . Attends Archivist Meetings: Never  . Marital Status: Never married  Intimate Partner Violence: Not At Risk  . Fear of Current or Ex-Partner: No  . Emotionally Abused: No  . Physically Abused: No  . Sexually Abused: No   Family History  Problem Relation Age of Onset  . Multiple myeloma Sister   . Brain cancer Sister   . Dementia Mother        died at 66  . Stroke Mother   . Heart failure Mother   . Diabetes Mother   . Heart disease Father   . Prostate cancer Brother   . Colon cancer Neg Hx       VITAL SIGNS BP (!) 156/72   Pulse 88   Temp 98.1 F (36.7 C)   Resp 20   Ht '5\' 3"'  (1.6 m)   Wt 144 lb (65.3 kg)   SpO2 98%    BMI 25.51 kg/m   Outpatient Encounter Medications as of 01/23/2020  Medication Sig  . acetaminophen (TYLENOL) 650 MG CR tablet Take 650 mg by mouth every 6 (six) hours.  Marland Kitchen acyclovir (ZOVIRAX) 200 MG capsule Take 200 mg by mouth daily.  Marland Kitchen amLODipine (NORVASC) 10 MG tablet Take 10 mg by mouth daily. Not taking on dialysis days  . anastrozole (ARIMIDEX) 1 MG tablet TAKE 1 TABLET BY MOUTH DAILY  . aspirin EC 81 MG tablet Take 81 mg by mouth daily.  . calcium acetate, Phos Binder, (PHOSLYRA) 667 MG/5ML SOLN Take 667 mg by mouth daily. With  snacks at 8 pm  . calcium acetate, Phos Binder, (PHOSLYRA) 667 MG/5ML SOLN Take 1,334 mg by mouth 2 (two) times daily with a meal. 7:30 am and 5:30 pm  . calcium-vitamin D (OSCAL WITH D) 500-200 MG-UNIT tablet Take 1 tablet by mouth daily. At 8 am  . cloNIDine (CATAPRES) 0.3 MG tablet Take 0.3 mg by mouth 2 (two) times daily.   . insulin aspart (NOVOLOG FLEXPEN) 100 UNIT/ML FlexPen Inject 10 Units into the skin in the morning and at bedtime. 8 am and 5 pm  . insulin aspart (NOVOLOG FLEXPEN) 100 UNIT/ML FlexPen Inject 15 Units into the skin daily. At 12 pm, hold for accu-check less than 150  . Insulin Glargine (BASAGLAR KWIKPEN) 100 UNIT/ML Inject 10 Units into the skin at bedtime.  . melatonin 3 MG TABS tablet Take 6 mg by mouth at bedtime. For Sleep  . Multiple Vitamin (MULTIVITAMIN) capsule Take 1 capsule by mouth daily.  . NON FORMULARY Diet: _____ Regular, ___x___ NAS, _______Consistent Carbohydrate, _______NPO __x___Other Low Potassium  . NON FORMULARY Wanderguard #3310 to ankle for safety awareness. Check placement and function qshift. Every Shift Day, Evening, Night  . Nutritional Supplements (NEPRO PO) Take 1 Can by mouth daily.  . [DISCONTINUED] Calcium Carbonate-Vitamin D (CALCIUM 600+D PO) Take 1 tablet by mouth daily.   No facility-administered encounter medications on file as of 01/23/2020.     SIGNIFICANT DIAGNOSTIC EXAMS   LABS  REVIEWED PREVIOUS  10-13-19: hgb a1c 6.0; vit B 12: 1126 folate 20.9 10-17-19: wbc 8.8; hgb 8.3; hct 26.1; mcv 114.0 plt 270; glucose 190; bun 28; creat 4.85; k+ 3.9; na++ 127; ca 8.9 liver normal albumin 4.0  10-31-19: wbc 6.0; hgb 8.8; hct 28.6; mcv 117.2 plt 326; glucose 188; bun 45; creat 6.28; k+ 3.9; na++ 137;ca 9.1 liver normal albumin 3.9 11-07-19: wbc 6.1; hgb 9.2; hct 29.4 mcv 115.7 plt 276; glucose 201; bun 44; creat 5.93; k+ 4.5; na++ 136; ca 9.1 liver normal albumin 3.8 11-14-19; wbc 8.3; hgb 9.7; hct 31.2 mcv 116.4 plt 261; glucose 198; bun 44; creat 6.66; k+ 5.1; na++ 138; ca 8.8 liver normal albumin 3.9   12-12-19: wbc 7.6; hgb 11.3; hct 35.6; mcv 109.9 plt 212; glucose 602; bun 67; creat 8.62; k+ 5.6; na++ 135; ca 7.9 liver normal albumin 3.5  01-09-20: wbc 6.9; hgb 12.1; hct 39.7; mcv 105.6 plt 299; glucose 225; bun 112; creat 14.53; k+ 6.4; na++ 132; ca 8.3 liver normal albumin 3.5   TODAY  01-23-20: k+ 6.6   Review of Systems  Constitutional: Negative for malaise/fatigue.  Respiratory: Negative for cough and shortness of breath.   Cardiovascular: Negative for chest pain, palpitations and leg swelling.  Gastrointestinal: Negative for abdominal pain, constipation and heartburn.  Musculoskeletal: Negative for back pain, joint pain and myalgias.  Skin: Negative.   Neurological: Negative for dizziness.  Psychiatric/Behavioral: The patient is not nervous/anxious.     Physical Exam Constitutional:      General: She is not in acute distress.    Appearance: She is well-developed. She is not diaphoretic.  Neck:     Thyroid: No thyromegaly.  Cardiovascular:     Rate and Rhythm: Normal rate and regular rhythm.     Pulses: Normal pulses.     Heart sounds: Normal heart sounds.  Pulmonary:     Effort: Pulmonary effort is normal. No respiratory distress.     Breath sounds: Normal breath sounds.  Abdominal:     General: Bowel sounds are normal.  There is no distension.      Palpations: Abdomen is soft.     Tenderness: There is no abdominal tenderness.  Musculoskeletal:        General: Normal range of motion.     Cervical back: Neck supple.     Right lower leg: No edema.     Left lower leg: No edema.  Lymphadenopathy:     Cervical: No cervical adenopathy.  Skin:    General: Skin is warm and dry.     Comments: Left arm a/v fistula: + thrill + bruit       Neurological:     Mental Status: She is alert. Mental status is at baseline.  Psychiatric:        Mood and Affect: Mood normal.       ASSESSMENT/ PLAN:  TODAY  1. Hyperkalemia 2. Chronic kidney disease with end stage renal disease on dialysis due to type 2 diabetes mellitus:    Is worse  Will give 60 gm kayexalate 60 gm now and will repeat k+ in the AM. Will keep dialysis center up to date.   MD is aware of resident's narcotic use and is in agreement with current plan of care. We will attempt to wean resident as appropriate.  Ok Edwards NP [facility]  Contact [phone] Monday through Friday 8am- 5pm  After hours call [phone]

## 2020-01-24 ENCOUNTER — Other Ambulatory Visit (HOSPITAL_COMMUNITY)
Admission: RE | Admit: 2020-01-24 | Discharge: 2020-01-24 | Disposition: A | Discharge status: Home / Self Care | Payer: Medicare PPO | Source: Skilled Nursing Facility | Attending: Adult Health | Admitting: Adult Health

## 2020-01-24 DIAGNOSIS — Z9181 History of falling: Secondary | ICD-10-CM | POA: Diagnosis not present

## 2020-01-24 DIAGNOSIS — N186 End stage renal disease: Secondary | ICD-10-CM | POA: Insufficient documentation

## 2020-01-24 DIAGNOSIS — M6281 Muscle weakness (generalized): Secondary | ICD-10-CM | POA: Diagnosis not present

## 2020-01-24 DIAGNOSIS — R262 Difficulty in walking, not elsewhere classified: Secondary | ICD-10-CM | POA: Diagnosis not present

## 2020-01-24 DIAGNOSIS — C9000 Multiple myeloma not having achieved remission: Secondary | ICD-10-CM | POA: Diagnosis not present

## 2020-01-24 LAB — POTASSIUM: Potassium: 4.4 mmol/L (ref 3.5–5.1)

## 2020-01-24 MED ORDER — LANREOTIDE ACETATE 120 MG/0.5ML ~~LOC~~ SOLN
SUBCUTANEOUS | Status: AC
  Filled 2020-01-24: qty 240

## 2020-01-25 ENCOUNTER — Non-Acute Institutional Stay (SKILLED_NURSING_FACILITY): Payer: Medicare PPO | Admitting: Adult Health

## 2020-01-25 ENCOUNTER — Encounter: Payer: Self-pay | Admitting: Adult Health

## 2020-01-25 DIAGNOSIS — Z992 Dependence on renal dialysis: Secondary | ICD-10-CM

## 2020-01-25 DIAGNOSIS — E1122 Type 2 diabetes mellitus with diabetic chronic kidney disease: Secondary | ICD-10-CM | POA: Diagnosis not present

## 2020-01-25 DIAGNOSIS — D631 Anemia in chronic kidney disease: Secondary | ICD-10-CM | POA: Diagnosis not present

## 2020-01-25 DIAGNOSIS — F01518 Vascular dementia, unspecified severity, with other behavioral disturbance: Secondary | ICD-10-CM

## 2020-01-25 DIAGNOSIS — F0151 Vascular dementia with behavioral disturbance: Secondary | ICD-10-CM | POA: Diagnosis not present

## 2020-01-25 DIAGNOSIS — Z23 Encounter for immunization: Secondary | ICD-10-CM | POA: Diagnosis not present

## 2020-01-25 DIAGNOSIS — N186 End stage renal disease: Secondary | ICD-10-CM | POA: Diagnosis not present

## 2020-01-25 NOTE — Progress Notes (Signed)
Location:    [facility] Room Number: 148/D Place of Service:  SNF (31)   CODE STATUS: Full Code  Allergies  Allergen Reactions  . Penicillins Other (See Comments)    Unsure of reaction Has patient had a PCN reaction causing immediate rash, facial/tongue/throat swelling, SOB or lightheadedness with hypotension: Unknown Has patient had a PCN reaction causing severe rash involving mucus membranes or skin necrosis: Unknown Has patient had a PCN reaction that required hospitalization: No Has patient had a PCN reaction occurring within the last 10 years: Unknown If all of the above answers are "NO", then may proceed with Cephalosporin use.    . Ace Inhibitors Cough and Other (See Comments)    Tongue swell  . Lisinopril Other (See Comments)    Tongue swell    Chief Complaint  Patient presents with  . Acute Visit    Care Plan Meeting    HPI:  We have come together for her care plan meeting. Family present. BIMS 11/15 mood 0/30. She has had one fall 01-02-20 without injury. Her cbgs are stable. She is working with therapy. For ambulation and car transfers. Her weight is stable at 144.5 pounds; her admission weight was 141.5 pounds. She has a good appetite. She requires supervision to limited assist with adls; feeds self. She is occasional incontinent of bladder and frequently incontinent of bowel. We have discussed her code status with family; they desire that she remain a full code. She continues to be followed for her chronic illnesses including:Chronic kidney disease with end stage renal disease on dialysis due to type 2 diabetes mellitus Vascular dementia with behavioral disturbance  Anemia due to end stage renal disease    Past Medical History:  Diagnosis Date  . Anemia   . Anxiety   . Chronic kidney disease   . Chronic renal disease, stage 4, severely decreased glomerular filtration rate (GFR) between 15-29 mL/min/1.73 square meter (HCC) 08/22/2015  .  Complication of anesthesia    delirious after Breast Surgery  . Dementia ([place])    mild  . Depression   . Diabetes mellitus without complication ([place])    type II  . Dyspnea    with activity  . GERD (gastroesophageal reflux disease)   . Glaucoma   . Hypertension   . Pneumonia   . Stage 1 infiltrating ductal carcinoma of right female breast ([place]) 08/21/2015   ER+ PR+ HER 2 neu + (3+) T1cN0     Past Surgical History:  Procedure Laterality Date  . AV FISTULA PLACEMENT Left 11/22/2017   Procedure: ARTERIOVENOUS (AV) FISTULA CREATION LEFT ARM;  Surgeon: Elam Dutch, MD;  Location: [facility];  Service: Vascular;  Laterality: Left;  . BIOPSY  08/07/2016   Procedure: BIOPSY;  Surgeon: Daneil Dolin, MD;  [facility];  Service: Endoscopy;;  gastric ulcer biopsy  . COLONOSCOPY    . ESOPHAGOGASTRODUODENOSCOPY N/A 08/07/2016   Procedure: ESOPHAGOGASTRODUODENOSCOPY (EGD);  Surgeon: Daneil Dolin, MD;  [facility];  Service: Endoscopy;  Laterality: N/A;  1215-rescheduled to 6/1 @ 2:30pm per Tretha Sciara  . ESOPHAGOGASTRODUODENOSCOPY N/A 11/27/2016   Procedure: ESOPHAGOGASTRODUODENOSCOPY (EGD);  Surgeon: Daneil Dolin, MD;  [facility];  Service: Endoscopy;  Laterality: N/A;  8:15am  . FISTULA SUPERFICIALIZATION Left 02/14/2018   Procedure: FISTULA SUPERFICIALIZATION LEFT ARM;  Surgeon: Angelia Mould, MD;  Location: [facility];  Service: Vascular;  Laterality: Left;  . FRACTURE SURGERY Right    ankle  .  MALONEY DILATION N/A 08/07/2016   Procedure: Venia Minks DILATION;  Surgeon: Daneil Dolin, MD;  [facility];  Service: Endoscopy;  Laterality: N/A;  . MASTECTOMY, PARTIAL Right   . PERIPHERAL VASCULAR BALLOON ANGIOPLASTY Left 07/13/2019   Procedure: PERIPHERAL VASCULAR BALLOON ANGIOPLASTY;  Surgeon: Marty Heck, MD;  Location: [facility];  Service: Cardiovascular;  Laterality: Left;  arm fistulogram  . PORT-A-CATH REMOVAL Left 11/22/2017    Procedure: REMOVAL PORT-A-CATH LEFT CHEST;  Surgeon: Elam Dutch, MD;  Location: [facility];  Service: Vascular;  Laterality: Left;  . RETINAL DETACHMENT SURGERY Right     Social History   Socioeconomic History  . Marital status: Single    Spouse name: Not on file  . Number of children: Not on file  . Years of education: Not on file  . Highest education level: Not on file  Occupational History  . Occupation: retired   Tobacco Use  . Smoking status: Never Smoker  . Smokeless tobacco: Never Used  Vaping Use  . Vaping Use: Never used  Substance and Sexual Activity  . Alcohol use: No    Alcohol/week: 0.0 standard drinks  . Drug use: No  . Sexual activity: Never  Other Topics Concern  . Not on file  Social History Narrative   Long term resident of SNF    Social Determinants of Health   Financial Resource Strain: Low Risk   . Difficulty of Paying Living Expenses: Not very hard  Food Insecurity: No Food Insecurity  . Worried About Charity fundraiser in the Last Year: Never true  . Ran Out of Food in the Last Year: Never true  Transportation Needs: No Transportation Needs  . Lack of Transportation (Medical): No  . Lack of Transportation (Non-Medical): No  Physical Activity: Inactive  . Days of Exercise per Week: 0 days  . Minutes of Exercise per Session: 0 min  Stress: No Stress Concern Present  . Feeling of Stress : Not at all  Social Connections: Moderately Isolated  . Frequency of Communication with Friends and Family: More than three times a week  . Frequency of Social Gatherings with Friends and Family: Once a week  . Attends Religious Services: More than 4 times per year  . Active Member of Clubs or Organizations: No  . Attends Archivist Meetings: Never  . Marital Status: Never married  Intimate Partner Violence: Not At Risk  . Fear of Current or Ex-Partner: No  . Emotionally Abused: No  . Physically Abused: No  . Sexually Abused: No   Family  History  Problem Relation Age of Onset  . Multiple myeloma Sister   . Brain cancer Sister   . Dementia Mother        died at 57  . Stroke Mother   . Heart failure Mother   . Diabetes Mother   . Heart disease Father   . Prostate cancer Brother   . Colon cancer Neg Hx       VITAL SIGNS BP (!) 156/72   Pulse 88   Temp (!) 97 F (36.1 C)   Ht _0  (1.6 m)   Wt 144 lb 9.6 oz (65.6 kg)   BMI 25.61 kg/m   Outpatient Encounter Medications as of 01/25/2020  Medication Sig  . acetaminophen (TYLENOL) 650 MG CR tablet Take 650 mg by mouth every 6 (six) hours.  Marland Kitchen acyclovir (ZOVIRAX) 200 MG capsule Take 200 mg by mouth daily.  Marland Kitchen amLODipine (  NORVASC) 10 MG tablet Take 10 mg by mouth daily. Not taking on dialysis days  . anastrozole (ARIMIDEX) 1 MG tablet TAKE 1 TABLET BY MOUTH DAILY  . aspirin EC 81 MG tablet Take 81 mg by mouth daily.  . calcium acetate, Phos Binder, (PHOSLYRA) 667 MG/5ML SOLN Take 667 mg by mouth daily. With snacks at 8 pm  . calcium acetate, Phos Binder, (PHOSLYRA) 667 MG/5ML SOLN Take 1,334 mg by mouth 2 (two) times daily with a meal. 7:30 am and 5:30 pm  . calcium-vitamin D (OSCAL WITH D) 500-200 MG-UNIT tablet Take 1 tablet by mouth daily. At 8 am  . cloNIDine (CATAPRES) 0.3 MG tablet Take 0.3 mg by mouth 2 (two) times daily.   . insulin aspart (NOVOLOG FLEXPEN) 100 UNIT/ML FlexPen Inject 10 Units into the skin in the morning and at bedtime. 8 am and 5 pm  . insulin aspart (NOVOLOG FLEXPEN) 100 UNIT/ML FlexPen Inject 15 Units into the skin daily. At 12 pm, hold for accu-check less than 150  . insulin glargine (LANTUS SOLOSTAR) 100 UNIT/ML Solostar Pen Inject 10 Units into the skin at bedtime.  . melatonin 3 MG TABS tablet Take 6 mg by mouth at bedtime. For Sleep  . Multiple Vitamin (MULTIVITAMIN) capsule Take 1 capsule by mouth daily.  . NON FORMULARY Diet: _____ Regular, ___x___ NAS, _______Consistent Carbohydrate, _______NPO __x___Other Low Potassium  . NON  FORMULARY Wanderguard #3310 to ankle for safety awareness. Check placement and function qshift. Every Shift Day, Evening, Night  . Nutritional Supplements (NEPRO PO) Take 1 Can by mouth daily.  . [DISCONTINUED] Insulin Glargine (BASAGLAR KWIKPEN) 100 UNIT/ML Inject 10 Units into the skin at bedtime.   No facility-administered encounter medications on file as of 01/25/2020.     SIGNIFICANT DIAGNOSTIC EXAMS    LABS REVIEWED PREVIOUS  10-13-19: hgb a1c 6.0; vit B 12: 1126 folate 20.9 10-17-19: wbc 8.8; hgb 8.3; hct 26.1; mcv 114.0 plt 270; glucose 190; bun 28; creat 4.85; k+ 3.9; na++ 127; ca 8.9 liver normal albumin 4.0  10-31-19: wbc 6.0; hgb 8.8; hct 28.6; mcv 117.2 plt 326; glucose 188; bun 45; creat 6.28; k+ 3.9; na++ 137;ca 9.1 liver normal albumin 3.9 11-07-19: wbc 6.1; hgb 9.2; hct 29.4 mcv 115.7 plt 276; glucose 201; bun 44; creat 5.93; k+ 4.5; na++ 136; ca 9.1 liver normal albumin 3.8 11-14-19; wbc 8.3; hgb 9.7; hct 31.2 mcv 116.4 plt 261; glucose 198; bun 44; creat 6.66; k+ 5.1; na++ 138; ca 8.8 liver normal albumin 3.9   12-12-19: wbc 7.6; hgb 11.3; hct 35.6; mcv 109.9 plt 212; glucose 602; bun 67; creat 8.62; k+ 5.6; na++ 135; ca 7.9 liver normal albumin 3.5  01-09-20: wbc 6.9; hgb 12.1; hct 39.7; mcv 105.6 plt 299; glucose 225; bun 112; creat 14.53; k+ 6.4; na++ 132; ca 8.3 liver normal albumin 3.5   TODAY  01-23-20: k+ 6.6  01-23-20: wbc 7.9; hgb 11.6; hct 36.9; mcv 103.7 plt 359; glucose 139; bun 109; creat 15.46; k+ 6.1; na++ 140; ca 8.7 liver normal albumin 4.1;  01-24-20: k+ 4.4   Review of Systems  Constitutional: Negative for malaise/fatigue.  Respiratory: Negative for cough and shortness of breath.   Cardiovascular: Negative for chest pain, palpitations and leg swelling.  Gastrointestinal: Negative for abdominal pain, constipation and heartburn.  Musculoskeletal: Negative for back pain, joint pain and myalgias.  Skin: Negative.   Neurological: Negative for dizziness.   Psychiatric/Behavioral: The patient is not nervous/anxious.     Physical Exam Constitutional:  General: She is not in acute distress.    Appearance: She is well-developed. She is not diaphoretic.  Neck:     Thyroid: No thyromegaly.  Cardiovascular:     Rate and Rhythm: Normal rate and regular rhythm.     Pulses: Normal pulses.     Heart sounds: Normal heart sounds.  Pulmonary:     Effort: Pulmonary effort is normal. No respiratory distress.     Breath sounds: Normal breath sounds.  Abdominal:     General: Bowel sounds are normal. There is no distension.     Palpations: Abdomen is soft.     Tenderness: There is no abdominal tenderness.  Musculoskeletal:        General: Normal range of motion.     Cervical back: Neck supple.     Right lower leg: No edema.     Left lower leg: No edema.  Lymphadenopathy:     Cervical: No cervical adenopathy.  Skin:    General: Skin is warm and dry.     Comments: Left arm a/v fistula  Neurological:     Mental Status: She is alert. Mental status is at baseline.  Psychiatric:        Mood and Affect: Mood normal.       ASSESSMENT/ PLAN:  TODAY  1. Chronic kidney disease with end stage renal disease on dialysis due to type 2 diabetes mellitus 2. Vascular dementia with behavioral disturbance 3. Anemia due to end stage renal disease  Will continue current plan of care Will current medications Will continue full code per family request.  Will continue to monitor her status.    MD is aware of resident's narcotic use and is in agreement with current plan of care. We will attempt to wean resident as appropriate.  Ok Edwards NP [facility]  Contact [phone] Monday through Friday 8am- 5pm  After hours call [phone]

## 2020-01-26 DIAGNOSIS — Z992 Dependence on renal dialysis: Secondary | ICD-10-CM | POA: Diagnosis not present

## 2020-01-26 DIAGNOSIS — Z23 Encounter for immunization: Secondary | ICD-10-CM | POA: Diagnosis not present

## 2020-01-26 DIAGNOSIS — N186 End stage renal disease: Secondary | ICD-10-CM | POA: Diagnosis not present

## 2020-01-29 DIAGNOSIS — Z992 Dependence on renal dialysis: Secondary | ICD-10-CM | POA: Diagnosis not present

## 2020-01-29 DIAGNOSIS — C9000 Multiple myeloma not having achieved remission: Secondary | ICD-10-CM | POA: Diagnosis not present

## 2020-01-29 DIAGNOSIS — M6281 Muscle weakness (generalized): Secondary | ICD-10-CM | POA: Diagnosis not present

## 2020-01-29 DIAGNOSIS — Z23 Encounter for immunization: Secondary | ICD-10-CM | POA: Diagnosis not present

## 2020-01-29 DIAGNOSIS — R262 Difficulty in walking, not elsewhere classified: Secondary | ICD-10-CM | POA: Diagnosis not present

## 2020-01-29 DIAGNOSIS — Z9181 History of falling: Secondary | ICD-10-CM | POA: Diagnosis not present

## 2020-01-29 DIAGNOSIS — N186 End stage renal disease: Secondary | ICD-10-CM | POA: Diagnosis not present

## 2020-01-30 ENCOUNTER — Other Ambulatory Visit: Payer: Self-pay

## 2020-01-30 ENCOUNTER — Inpatient Hospital Stay (HOSPITAL_COMMUNITY): Payer: Medicare PPO

## 2020-01-30 VITALS — BP 109/59 | HR 97 | Temp 97.0°F | Resp 16 | Wt 131.9 lb

## 2020-01-30 DIAGNOSIS — C50411 Malignant neoplasm of upper-outer quadrant of right female breast: Secondary | ICD-10-CM | POA: Diagnosis not present

## 2020-01-30 DIAGNOSIS — M6281 Muscle weakness (generalized): Secondary | ICD-10-CM | POA: Diagnosis not present

## 2020-01-30 DIAGNOSIS — G479 Sleep disorder, unspecified: Secondary | ICD-10-CM | POA: Diagnosis not present

## 2020-01-30 DIAGNOSIS — C9000 Multiple myeloma not having achieved remission: Secondary | ICD-10-CM | POA: Diagnosis not present

## 2020-01-30 DIAGNOSIS — N186 End stage renal disease: Secondary | ICD-10-CM | POA: Diagnosis not present

## 2020-01-30 DIAGNOSIS — R262 Difficulty in walking, not elsewhere classified: Secondary | ICD-10-CM | POA: Diagnosis not present

## 2020-01-30 DIAGNOSIS — Z9181 History of falling: Secondary | ICD-10-CM | POA: Diagnosis not present

## 2020-01-30 DIAGNOSIS — Z170 Estrogen receptor positive status [ER+]: Secondary | ICD-10-CM | POA: Diagnosis not present

## 2020-01-30 DIAGNOSIS — Z992 Dependence on renal dialysis: Secondary | ICD-10-CM | POA: Diagnosis not present

## 2020-01-30 DIAGNOSIS — Z79811 Long term (current) use of aromatase inhibitors: Secondary | ICD-10-CM | POA: Diagnosis not present

## 2020-01-30 DIAGNOSIS — E1122 Type 2 diabetes mellitus with diabetic chronic kidney disease: Secondary | ICD-10-CM | POA: Diagnosis not present

## 2020-01-30 DIAGNOSIS — Z5111 Encounter for antineoplastic chemotherapy: Secondary | ICD-10-CM | POA: Diagnosis not present

## 2020-01-30 LAB — COMPREHENSIVE METABOLIC PANEL
ALT: 10 U/L (ref 0–44)
AST: 16 U/L (ref 15–41)
Albumin: 3.3 g/dL — ABNORMAL LOW (ref 3.5–5.0)
Alkaline Phosphatase: 73 U/L (ref 38–126)
Anion gap: 16 — ABNORMAL HIGH (ref 5–15)
BUN: 44 mg/dL — ABNORMAL HIGH (ref 8–23)
CO2: 26 mmol/L (ref 22–32)
Calcium: 9 mg/dL (ref 8.9–10.3)
Chloride: 97 mmol/L — ABNORMAL LOW (ref 98–111)
Creatinine, Ser: 9.08 mg/dL — ABNORMAL HIGH (ref 0.44–1.00)
GFR, Estimated: 4 mL/min — ABNORMAL LOW (ref >60)
Glucose, Bld: 164 mg/dL — ABNORMAL HIGH (ref 70–99)
Potassium: 4.1 mmol/L (ref 3.5–5.1)
Sodium: 139 mmol/L (ref 135–145)
Total Bilirubin: 0.4 mg/dL (ref 0.3–1.2)
Total Protein: 7.1 g/dL (ref 6.5–8.1)

## 2020-01-30 LAB — CBC WITH DIFFERENTIAL/PLATELET
Abs Immature Granulocytes: 0.04 10*3/uL (ref 0.00–0.07)
Basophils Absolute: 0 10*3/uL (ref 0.0–0.1)
Basophils Relative: 0 %
Eosinophils Absolute: 0.2 10*3/uL (ref 0.0–0.5)
Eosinophils Relative: 2 %
HCT: 32 % — ABNORMAL LOW (ref 36.0–46.0)
Hemoglobin: 9.7 g/dL — ABNORMAL LOW (ref 12.0–15.0)
Immature Granulocytes: 0 %
Lymphocytes Relative: 15 %
Lymphs Abs: 1.6 10*3/uL (ref 0.7–4.0)
MCH: 31.4 pg (ref 26.0–34.0)
MCHC: 30.3 g/dL (ref 30.0–36.0)
MCV: 103.6 fL — ABNORMAL HIGH (ref 80.0–100.0)
Monocytes Absolute: 0.8 10*3/uL (ref 0.1–1.0)
Monocytes Relative: 8 %
Neutro Abs: 8 10*3/uL — ABNORMAL HIGH (ref 1.7–7.7)
Neutrophils Relative %: 75 %
Platelets: 390 10*3/uL (ref 150–400)
RBC: 3.09 MIL/uL — ABNORMAL LOW (ref 3.87–5.11)
RDW: 15.9 % — ABNORMAL HIGH (ref 11.5–15.5)
WBC: 10.6 10*3/uL — ABNORMAL HIGH (ref 4.0–10.5)
nRBC: 0 % (ref 0.0–0.2)

## 2020-01-30 MED ORDER — PROCHLORPERAZINE MALEATE 10 MG PO TABS
10.0000 mg | ORAL_TABLET | Freq: Once | ORAL | Status: AC
  Administered 2020-01-30: 10 mg via ORAL
  Filled 2020-01-30: qty 1

## 2020-01-30 MED ORDER — DEXAMETHASONE 4 MG PO TABS
10.0000 mg | ORAL_TABLET | Freq: Once | ORAL | Status: AC
  Administered 2020-01-30: 10 mg via ORAL
  Filled 2020-01-30: qty 3

## 2020-01-30 MED ORDER — BORTEZOMIB CHEMO SQ INJECTION 3.5 MG (2.5MG/ML)
1.3000 mg/m2 | Freq: Once | INTRAMUSCULAR | Status: AC
  Administered 2020-01-30: 2.25 mg via SUBCUTANEOUS
  Filled 2020-01-30: qty 0.9

## 2020-01-30 NOTE — Progress Notes (Signed)
Velcade given today per MD orders. Tolerated infusion without adverse affects. Vital signs stable. No complaints at this time. Discharged from clinic via wheelchair in stable condition. Alert and oriented x 3. F/U with [facility] as scheduled.   

## 2020-01-30 NOTE — Progress Notes (Signed)
Patient presents today for Velcade injection.  Vital signs within parameters.  No new complaints.  Labs within parameters for treatment.

## 2020-01-30 NOTE — Patient Instructions (Signed)
[facility] Discharge Instructions for Patients Receiving Chemotherapy  Today you received the following chemotherapy agents   To help prevent nausea and vomiting after your treatment, we encourage you to take your nausea medication   If you develop nausea and vomiting that is not controlled by your nausea medication, call the clinic.   BELOW ARE SYMPTOMS THAT SHOULD BE REPORTED IMMEDIATELY:  *FEVER GREATER THAN 100.5 F  *CHILLS WITH OR WITHOUT FEVER  NAUSEA AND VOMITING THAT IS NOT CONTROLLED WITH YOUR NAUSEA MEDICATION  *UNUSUAL SHORTNESS OF BREATH  *UNUSUAL BRUISING OR BLEEDING  TENDERNESS IN MOUTH AND THROAT WITH OR WITHOUT PRESENCE OF ULCERS  *URINARY PROBLEMS  *BOWEL PROBLEMS  UNUSUAL RASH Items with * indicate a potential emergency and should be followed up as soon as possible.  Feel free to call the clinic should you have any questions or concerns. The clinic phone number is [phone].  Please show the CHEMO ALERT CARD at check-in to the Emergency Department and triage nurse.   

## 2020-01-31 DIAGNOSIS — Z23 Encounter for immunization: Secondary | ICD-10-CM | POA: Diagnosis not present

## 2020-01-31 DIAGNOSIS — M6281 Muscle weakness (generalized): Secondary | ICD-10-CM | POA: Diagnosis not present

## 2020-01-31 DIAGNOSIS — Z9181 History of falling: Secondary | ICD-10-CM | POA: Diagnosis not present

## 2020-01-31 DIAGNOSIS — C9000 Multiple myeloma not having achieved remission: Secondary | ICD-10-CM | POA: Diagnosis not present

## 2020-01-31 DIAGNOSIS — N186 End stage renal disease: Secondary | ICD-10-CM | POA: Diagnosis not present

## 2020-01-31 DIAGNOSIS — R262 Difficulty in walking, not elsewhere classified: Secondary | ICD-10-CM | POA: Diagnosis not present

## 2020-01-31 DIAGNOSIS — Z992 Dependence on renal dialysis: Secondary | ICD-10-CM | POA: Diagnosis not present

## 2020-02-02 DIAGNOSIS — N186 End stage renal disease: Secondary | ICD-10-CM | POA: Diagnosis not present

## 2020-02-02 DIAGNOSIS — Z992 Dependence on renal dialysis: Secondary | ICD-10-CM | POA: Diagnosis not present

## 2020-02-02 DIAGNOSIS — Z23 Encounter for immunization: Secondary | ICD-10-CM | POA: Diagnosis not present

## 2020-02-05 DIAGNOSIS — R262 Difficulty in walking, not elsewhere classified: Secondary | ICD-10-CM | POA: Diagnosis not present

## 2020-02-05 DIAGNOSIS — Z9181 History of falling: Secondary | ICD-10-CM | POA: Diagnosis not present

## 2020-02-05 DIAGNOSIS — M6281 Muscle weakness (generalized): Secondary | ICD-10-CM | POA: Diagnosis not present

## 2020-02-05 DIAGNOSIS — C9000 Multiple myeloma not having achieved remission: Secondary | ICD-10-CM | POA: Diagnosis not present

## 2020-02-06 ENCOUNTER — Encounter (HOSPITAL_COMMUNITY): Payer: Self-pay

## 2020-02-06 ENCOUNTER — Other Ambulatory Visit: Payer: Self-pay

## 2020-02-06 ENCOUNTER — Inpatient Hospital Stay (HOSPITAL_COMMUNITY): Payer: Medicare PPO

## 2020-02-06 VITALS — BP 176/69 | HR 95 | Temp 97.2°F | Resp 18 | Wt 132.5 lb

## 2020-02-06 DIAGNOSIS — Z992 Dependence on renal dialysis: Secondary | ICD-10-CM | POA: Diagnosis not present

## 2020-02-06 DIAGNOSIS — C9000 Multiple myeloma not having achieved remission: Secondary | ICD-10-CM

## 2020-02-06 DIAGNOSIS — M6281 Muscle weakness (generalized): Secondary | ICD-10-CM | POA: Diagnosis not present

## 2020-02-06 DIAGNOSIS — Z5111 Encounter for antineoplastic chemotherapy: Secondary | ICD-10-CM | POA: Diagnosis not present

## 2020-02-06 DIAGNOSIS — Z79811 Long term (current) use of aromatase inhibitors: Secondary | ICD-10-CM | POA: Diagnosis not present

## 2020-02-06 DIAGNOSIS — R262 Difficulty in walking, not elsewhere classified: Secondary | ICD-10-CM | POA: Diagnosis not present

## 2020-02-06 DIAGNOSIS — N186 End stage renal disease: Secondary | ICD-10-CM | POA: Diagnosis not present

## 2020-02-06 DIAGNOSIS — Z1159 Encounter for screening for other viral diseases: Secondary | ICD-10-CM | POA: Diagnosis not present

## 2020-02-06 DIAGNOSIS — E1122 Type 2 diabetes mellitus with diabetic chronic kidney disease: Secondary | ICD-10-CM | POA: Diagnosis not present

## 2020-02-06 DIAGNOSIS — G479 Sleep disorder, unspecified: Secondary | ICD-10-CM | POA: Diagnosis not present

## 2020-02-06 DIAGNOSIS — Z9181 History of falling: Secondary | ICD-10-CM | POA: Diagnosis not present

## 2020-02-06 DIAGNOSIS — Z170 Estrogen receptor positive status [ER+]: Secondary | ICD-10-CM | POA: Diagnosis not present

## 2020-02-06 DIAGNOSIS — C50411 Malignant neoplasm of upper-outer quadrant of right female breast: Secondary | ICD-10-CM | POA: Diagnosis not present

## 2020-02-06 LAB — CBC WITH DIFFERENTIAL/PLATELET
Abs Immature Granulocytes: 0.03 10*3/uL (ref 0.00–0.07)
Basophils Absolute: 0.1 10*3/uL (ref 0.0–0.1)
Basophils Relative: 1 %
Eosinophils Absolute: 0.2 10*3/uL (ref 0.0–0.5)
Eosinophils Relative: 2 %
HCT: 32.7 % — ABNORMAL LOW (ref 36.0–46.0)
Hemoglobin: 9.9 g/dL — ABNORMAL LOW (ref 12.0–15.0)
Immature Granulocytes: 0 %
Lymphocytes Relative: 15 %
Lymphs Abs: 1.2 10*3/uL (ref 0.7–4.0)
MCH: 31.7 pg (ref 26.0–34.0)
MCHC: 30.3 g/dL (ref 30.0–36.0)
MCV: 104.8 fL — ABNORMAL HIGH (ref 80.0–100.0)
Monocytes Absolute: 0.6 10*3/uL (ref 0.1–1.0)
Monocytes Relative: 8 %
Neutro Abs: 5.8 10*3/uL (ref 1.7–7.7)
Neutrophils Relative %: 74 %
Platelets: 344 10*3/uL (ref 150–400)
RBC: 3.12 MIL/uL — ABNORMAL LOW (ref 3.87–5.11)
RDW: 16.3 % — ABNORMAL HIGH (ref 11.5–15.5)
WBC: 7.8 10*3/uL (ref 4.0–10.5)
nRBC: 0 % (ref 0.0–0.2)

## 2020-02-06 LAB — COMPREHENSIVE METABOLIC PANEL
ALT: 14 U/L (ref 0–44)
AST: 19 U/L (ref 15–41)
Albumin: 3.4 g/dL — ABNORMAL LOW (ref 3.5–5.0)
Alkaline Phosphatase: 80 U/L (ref 38–126)
Anion gap: 18 — ABNORMAL HIGH (ref 5–15)
BUN: 90 mg/dL — ABNORMAL HIGH (ref 8–23)
CO2: 22 mmol/L (ref 22–32)
Calcium: 8.8 mg/dL — ABNORMAL LOW (ref 8.9–10.3)
Chloride: 97 mmol/L — ABNORMAL LOW (ref 98–111)
Creatinine, Ser: 10.97 mg/dL — ABNORMAL HIGH (ref 0.44–1.00)
GFR, Estimated: 3 mL/min — ABNORMAL LOW (ref >60)
Glucose, Bld: 211 mg/dL — ABNORMAL HIGH (ref 70–99)
Potassium: 6 mmol/L — ABNORMAL HIGH (ref 3.5–5.1)
Sodium: 137 mmol/L (ref 135–145)
Total Bilirubin: 0.5 mg/dL (ref 0.3–1.2)
Total Protein: 6.7 g/dL (ref 6.5–8.1)

## 2020-02-06 MED ORDER — DEXAMETHASONE 4 MG PO TABS
10.0000 mg | ORAL_TABLET | Freq: Once | ORAL | Status: AC
  Administered 2020-02-06: 10 mg via ORAL
  Filled 2020-02-06: qty 3

## 2020-02-06 MED ORDER — BORTEZOMIB CHEMO SQ INJECTION 3.5 MG (2.5MG/ML)
1.3000 mg/m2 | Freq: Once | INTRAMUSCULAR | Status: AC
  Administered 2020-02-06: 2.25 mg via SUBCUTANEOUS
  Filled 2020-02-06: qty 0.9

## 2020-02-06 MED ORDER — PROCHLORPERAZINE MALEATE 10 MG PO TABS
10.0000 mg | ORAL_TABLET | Freq: Once | ORAL | Status: AC
  Administered 2020-02-06: 10 mg via ORAL
  Filled 2020-02-06: qty 1

## 2020-02-06 NOTE — Progress Notes (Signed)
Patient tolerated Velcade injection with no complaints voiced.  Lab work reviewed.  See MAR for details.  Injection site clean and dry with no bruising or swelling noted.  Band aid applied.  VSS.  Patient left in satisfactory condition with no s/s of distress noted.  

## 2020-02-07 DIAGNOSIS — R262 Difficulty in walking, not elsewhere classified: Secondary | ICD-10-CM | POA: Diagnosis not present

## 2020-02-07 DIAGNOSIS — N186 End stage renal disease: Secondary | ICD-10-CM | POA: Diagnosis not present

## 2020-02-07 DIAGNOSIS — C9000 Multiple myeloma not having achieved remission: Secondary | ICD-10-CM | POA: Diagnosis not present

## 2020-02-07 DIAGNOSIS — Z9181 History of falling: Secondary | ICD-10-CM | POA: Diagnosis not present

## 2020-02-07 DIAGNOSIS — Z23 Encounter for immunization: Secondary | ICD-10-CM | POA: Diagnosis not present

## 2020-02-07 DIAGNOSIS — M6281 Muscle weakness (generalized): Secondary | ICD-10-CM | POA: Diagnosis not present

## 2020-02-07 DIAGNOSIS — Z992 Dependence on renal dialysis: Secondary | ICD-10-CM | POA: Diagnosis not present

## 2020-02-07 MED ORDER — OCTREOTIDE ACETATE 30 MG IM KIT
PACK | INTRAMUSCULAR | Status: AC
  Filled 2020-02-07: qty 1

## 2020-02-08 DIAGNOSIS — C9000 Multiple myeloma not having achieved remission: Secondary | ICD-10-CM | POA: Diagnosis not present

## 2020-02-08 DIAGNOSIS — R262 Difficulty in walking, not elsewhere classified: Secondary | ICD-10-CM | POA: Diagnosis not present

## 2020-02-08 DIAGNOSIS — M6281 Muscle weakness (generalized): Secondary | ICD-10-CM | POA: Diagnosis not present

## 2020-02-08 DIAGNOSIS — Z9181 History of falling: Secondary | ICD-10-CM | POA: Diagnosis not present

## 2020-02-09 DIAGNOSIS — C9000 Multiple myeloma not having achieved remission: Secondary | ICD-10-CM | POA: Diagnosis not present

## 2020-02-09 DIAGNOSIS — Z9181 History of falling: Secondary | ICD-10-CM | POA: Diagnosis not present

## 2020-02-09 DIAGNOSIS — Z23 Encounter for immunization: Secondary | ICD-10-CM | POA: Diagnosis not present

## 2020-02-09 DIAGNOSIS — M6281 Muscle weakness (generalized): Secondary | ICD-10-CM | POA: Diagnosis not present

## 2020-02-09 DIAGNOSIS — N186 End stage renal disease: Secondary | ICD-10-CM | POA: Diagnosis not present

## 2020-02-09 DIAGNOSIS — Z992 Dependence on renal dialysis: Secondary | ICD-10-CM | POA: Diagnosis not present

## 2020-02-09 DIAGNOSIS — R262 Difficulty in walking, not elsewhere classified: Secondary | ICD-10-CM | POA: Diagnosis not present

## 2020-02-12 ENCOUNTER — Other Ambulatory Visit: Payer: Self-pay

## 2020-02-12 DIAGNOSIS — N184 Chronic kidney disease, stage 4 (severe): Secondary | ICD-10-CM

## 2020-02-12 DIAGNOSIS — Z9181 History of falling: Secondary | ICD-10-CM | POA: Diagnosis not present

## 2020-02-12 DIAGNOSIS — R262 Difficulty in walking, not elsewhere classified: Secondary | ICD-10-CM | POA: Diagnosis not present

## 2020-02-12 DIAGNOSIS — Z992 Dependence on renal dialysis: Secondary | ICD-10-CM | POA: Diagnosis not present

## 2020-02-12 DIAGNOSIS — N186 End stage renal disease: Secondary | ICD-10-CM | POA: Diagnosis not present

## 2020-02-12 DIAGNOSIS — Z23 Encounter for immunization: Secondary | ICD-10-CM | POA: Diagnosis not present

## 2020-02-12 DIAGNOSIS — C9000 Multiple myeloma not having achieved remission: Secondary | ICD-10-CM | POA: Diagnosis not present

## 2020-02-12 DIAGNOSIS — M6281 Muscle weakness (generalized): Secondary | ICD-10-CM | POA: Diagnosis not present

## 2020-02-13 DIAGNOSIS — M6281 Muscle weakness (generalized): Secondary | ICD-10-CM | POA: Diagnosis not present

## 2020-02-13 DIAGNOSIS — R262 Difficulty in walking, not elsewhere classified: Secondary | ICD-10-CM | POA: Diagnosis not present

## 2020-02-13 DIAGNOSIS — Z9181 History of falling: Secondary | ICD-10-CM | POA: Diagnosis not present

## 2020-02-13 DIAGNOSIS — C9000 Multiple myeloma not having achieved remission: Secondary | ICD-10-CM | POA: Diagnosis not present

## 2020-02-14 DIAGNOSIS — Z992 Dependence on renal dialysis: Secondary | ICD-10-CM | POA: Diagnosis not present

## 2020-02-14 DIAGNOSIS — Z23 Encounter for immunization: Secondary | ICD-10-CM | POA: Diagnosis not present

## 2020-02-14 DIAGNOSIS — N186 End stage renal disease: Secondary | ICD-10-CM | POA: Diagnosis not present

## 2020-02-16 ENCOUNTER — Non-Acute Institutional Stay (SKILLED_NURSING_FACILITY): Payer: Medicare PPO | Admitting: Adult Health

## 2020-02-16 ENCOUNTER — Encounter: Payer: Self-pay | Admitting: Adult Health

## 2020-02-16 DIAGNOSIS — Z23 Encounter for immunization: Secondary | ICD-10-CM | POA: Diagnosis not present

## 2020-02-16 DIAGNOSIS — B009 Herpesviral infection, unspecified: Secondary | ICD-10-CM

## 2020-02-16 DIAGNOSIS — E1122 Type 2 diabetes mellitus with diabetic chronic kidney disease: Secondary | ICD-10-CM | POA: Diagnosis not present

## 2020-02-16 DIAGNOSIS — F01518 Vascular dementia, unspecified severity, with other behavioral disturbance: Secondary | ICD-10-CM

## 2020-02-16 DIAGNOSIS — Z992 Dependence on renal dialysis: Secondary | ICD-10-CM | POA: Diagnosis not present

## 2020-02-16 DIAGNOSIS — N186 End stage renal disease: Secondary | ICD-10-CM

## 2020-02-16 DIAGNOSIS — F0151 Vascular dementia with behavioral disturbance: Secondary | ICD-10-CM

## 2020-02-16 NOTE — Progress Notes (Signed)
Location:  _0  [facility] Room Number: 148/D Place of Service:  SNF (31)   CODE STATUS: Full Code  Allergies  Allergen Reactions   Penicillins Other (See Comments)    Unsure of reaction Has patient had a PCN reaction causing immediate rash, facial/tongue/throat swelling, SOB or lightheadedness with hypotension: Unknown Has patient had a PCN reaction causing severe rash involving mucus membranes or skin necrosis: Unknown Has patient had a PCN reaction that required hospitalization: No Has patient had a PCN reaction occurring within the last 10 years: Unknown If all of the above answers are "NO", then may proceed with Cephalosporin use.     Ace Inhibitors Cough and Other (See Comments)    Tongue swell   Lisinopril Other (See Comments)    Tongue swell    Chief Complaint  Patient presents with   Medical Management of Chronic Issues    Routine Visit of Medical Management      Herpes:   Vascular dementia with behavioral disturbance:  Diabetes with end stage renal disease (ESRD)       HPI:  She is a 84 year old long term resident of this facility being seen for the management of her chronic illnesses:Herpes:   Vascular dementia with behavioral disturbance:  Diabetes with end stage renal disease (ESRD). There are no reports of uncontrolled pain; no changes in appetite; no reports of insomnia; no reports of anxiety.    Past Medical History:  Diagnosis Date   Anemia    Anxiety    Chronic kidney disease    Chronic renal disease, stage 4, severely decreased glomerular filtration rate (GFR) between 15-29 mL/min/1.73 square meter ([place]) 2/63/7858   Complication of anesthesia    delirious after Breast Surgery   Dementia ([place])    mild   Depression    Diabetes mellitus without complication ([place])    type II   Dyspnea    with activity   GERD (gastroesophageal reflux disease)    Glaucoma    Hypertension    Pneumonia    Stage 1 infiltrating  ductal carcinoma of right female breast ([place]) 08/21/2015   ER+ PR+ HER 2 neu + (3+) T1cN0     Past Surgical History:  Procedure Laterality Date   AV FISTULA PLACEMENT Left 11/22/2017   Procedure: ARTERIOVENOUS (AV) FISTULA CREATION LEFT ARM;  Surgeon: Elam Dutch, MD;  Location: [place];  Service: Vascular;  Laterality: Left;   BIOPSY  08/07/2016   Procedure: BIOPSY;  Surgeon: Daneil Dolin, MD;  [facility];  Service: Endoscopy;;  gastric ulcer biopsy   COLONOSCOPY     ESOPHAGOGASTRODUODENOSCOPY N/A 08/07/2016   Procedure: ESOPHAGOGASTRODUODENOSCOPY (EGD);  Surgeon: Daneil Dolin, MD;  [facility];  Service: Endoscopy;  Laterality: N/A;  1215-rescheduled to 6/1 @ 2:30pm per Tretha Sciara   ESOPHAGOGASTRODUODENOSCOPY N/A 11/27/2016   Procedure: ESOPHAGOGASTRODUODENOSCOPY (EGD);  Surgeon: Daneil Dolin, MD;  [facility];  Service: Endoscopy;  Laterality: N/A;  8:15am   FISTULA SUPERFICIALIZATION Left 02/14/2018   Procedure: FISTULA SUPERFICIALIZATION LEFT ARM;  Surgeon: Angelia Mould, MD;  Location: [facility];  Service: Vascular;  Laterality: Left;   FRACTURE SURGERY Right    ankle   MALONEY DILATION N/A 08/07/2016   Procedure: Venia Minks DILATION;  Surgeon: Daneil Dolin, MD;  [facility];  Service: Endoscopy;  Laterality: N/A;   MASTECTOMY, PARTIAL Right    PERIPHERAL VASCULAR BALLOON ANGIOPLASTY Left 07/13/2019   Procedure: PERIPHERAL VASCULAR BALLOON ANGIOPLASTY;  Surgeon: Marty Heck, MD;  Location: [facility];  Service: Cardiovascular;  Laterality: Left;  arm fistulogram   PORT-A-CATH REMOVAL Left 11/22/2017   Procedure: REMOVAL PORT-A-CATH LEFT CHEST;  Surgeon: Elam Dutch, MD;  Location: [facility] OR;  Service: Vascular;  Laterality: Left;   RETINAL DETACHMENT SURGERY Right     Social History   Socioeconomic History   Marital status: Single    Spouse name: Not on file   Number of children: Not on file    Years of education: Not on file   Highest education level: Not on file  Occupational History   Occupation: retired   Tobacco Use   Smoking status: Never Smoker   Smokeless tobacco: Never Used  Scientific laboratory technician Use: Never used  Substance and Sexual Activity   Alcohol use: No    Alcohol/week: 0.0 standard drinks   Drug use: No   Sexual activity: Never  Other Topics Concern   Not on file  Social History Narrative   Long term resident of SNF    Social Determinants of Health   Financial Resource Strain: Low Risk    Difficulty of Paying Living Expenses: Not very hard  Food Insecurity: No Food Insecurity   Worried About Charity fundraiser in the Last Year: Never true   [place] in the Last Year: Never true  Transportation Needs: No Transportation Needs   Lack of Transportation (Medical): No   Lack of Transportation (Non-Medical): No  Physical Activity: Inactive   Days of Exercise per Week: 0 days   Minutes of Exercise per Session: 0 min  Stress: No Stress Concern Present   Feeling of Stress : Not at all  Social Connections: Moderately Isolated   Frequency of Communication with Friends and Family: More than three times a week   Frequency of Social Gatherings with Friends and Family: Once a week   Attends Religious Services: More than 4 times per year   Active Member of [employer] or Organizations: No   Attends Music therapist: Never   Marital Status: Never married  Human resources officer Violence: Not At Risk   Fear of Current or Ex-Partner: No   Emotionally Abused: No   Physically Abused: No   Sexually Abused: No   Family History  Problem Relation Age of Onset   Multiple myeloma Sister    Brain cancer Sister    Dementia Mother        died at 81   Stroke Mother    Heart failure Mother    Diabetes Mother    Heart disease Father    Prostate cancer Brother    Colon cancer Neg Hx       VITAL SIGNS BP (!) 155/83     Pulse 98    Temp 98.2 F (36.8 C)    Resp (!) 22    Ht _0  (1.6 m)    Wt 144 lb 9.6 oz (65.6 kg)    SpO2 98%    BMI 25.61 kg/m   Outpatient Encounter Medications as of 02/16/2020  Medication Sig   acetaminophen (TYLENOL) 650 MG CR tablet Take 650 mg by mouth every 6 (six) hours.   acyclovir (ZOVIRAX) 200 MG capsule Take 200 mg by mouth daily.   amLODipine (NORVASC) 10 MG tablet Take 10 mg by mouth daily. Not taking on dialysis days   anastrozole (ARIMIDEX) 1 MG tablet TAKE 1 TABLET BY MOUTH DAILY   aspirin EC 81  MG tablet Take 81 mg by mouth daily.   calcium acetate, Phos Binder, (PHOSLYRA) 667 MG/5ML SOLN Take 667 mg by mouth daily. With snacks at Daily   calcium acetate, Phos Binder, (PHOSLYRA) 667 MG/5ML SOLN Take 1,334 mg by mouth 2 (two) times daily with a meal. 7:30 am and 5:30 pm   calcium-vitamin D (OSCAL WITH D) 500-200 MG-UNIT tablet Take 1 tablet by mouth daily. At 8 am   cloNIDine (CATAPRES) 0.3 MG tablet Take 0.3 mg by mouth 2 (two) times daily.    insulin aspart (NOVOLOG FLEXPEN) 100 UNIT/ML FlexPen Inject 10 Units into the skin in the morning and at bedtime. 8 am and 5 pm   insulin aspart (NOVOLOG FLEXPEN) 100 UNIT/ML FlexPen Inject 15 Units into the skin daily. At 12 pm, hold for accu-check less than 150   insulin glargine (LANTUS SOLOSTAR) 100 UNIT/ML Solostar Pen Inject 10 Units into the skin at bedtime.   melatonin 3 MG TABS tablet Take 6 mg by mouth at bedtime. For Sleep   Multiple Vitamin (MULTIVITAMIN) capsule Take 1 capsule by mouth daily.   NON FORMULARY Diet: _____ Regular, ___x___ NAS, _______Consistent Carbohydrate, _______NPO __x___Other Low Potassium   NON FORMULARY Wanderguard #3310 to ankle for safety awareness. Check placement and function qshift. Every Shift Day, Evening, Night   Nutritional Supplements (NEPRO PO) Take 1 Can by mouth daily.   No facility-administered encounter medications on file as of 02/16/2020.     SIGNIFICANT  DIAGNOSTIC EXAMS   LABS REVIEWED PREVIOUS  10-13-19: hgb a1c 6.0; vit B 12: 1126 folate 20.9 10-17-19: wbc 8.8; hgb 8.3; hct 26.1; mcv 114.0 plt 270; glucose 190; bun 28; creat 4.85; k+ 3.9; na++ 127; ca 8.9 liver normal albumin 4.0  10-31-19: wbc 6.0; hgb 8.8; hct 28.6; mcv 117.2 plt 326; glucose 188; bun 45; creat 6.28; k+ 3.9; na++ 137;ca 9.1 liver normal albumin 3.9 11-07-19: wbc 6.1; hgb 9.2; hct 29.4 mcv 115.7 plt 276; glucose 201; bun 44; creat 5.93; k+ 4.5; na++ 136; ca 9.1 liver normal albumin 3.8 11-14-19; wbc 8.3; hgb 9.7; hct 31.2 mcv 116.4 plt 261; glucose 198; bun 44; creat 6.66; k+ 5.1; na++ 138; ca 8.8 liver normal albumin 3.9   12-12-19: wbc 7.6; hgb 11.3; hct 35.6; mcv 109.9 plt 212; glucose 602; bun 67; creat 8.62; k+ 5.6; na++ 135; ca 7.9 liver normal albumin 3.5  12-20-19: hgb a1c 8.0  01-09-20: wbc 6.9; hgb 12.1; hct 39.7; mcv 105.6 plt 299; glucose 225; bun 112; creat 14.53; k+ 6.4; na++ 132; ca 8.3 liver normal albumin 3.5  01-23-20: k+ 6.6  01-23-20: wbc 7.9; hgb 11.6; hct 36.9; mcv 103.7 plt 359; glucose 139; bun 109; creat 15.46; k+ 6.1; na++ 140; ca 8.7 liver normal albumin 4.1;  01-24-20: k+ 4.4   TODAY  01-30-20: wbc 10.6; hgb 9.7; hct 32.0; mcv 103.6 plt 390; glucose 164; bun 44; creat 9.08; k+ 4.1; na++ 139; ca 9.0 liver normal albumin 3.3 02-06-20: wbc 7.8; hgb 9.9; hct 32.7 mcv 104.8 plt 344; glucose 211; bun 90; creat 10.97; k+ 6.0 na++ 137; ca 8.8 liver normal albumin 3.4   Review of Systems  Constitutional: Negative for malaise/fatigue.  Respiratory: Negative for cough and shortness of breath.   Cardiovascular: Negative for chest pain, palpitations and leg swelling.  Gastrointestinal: Negative for abdominal pain, constipation and heartburn.  Musculoskeletal: Negative for back pain, joint pain and myalgias.  Skin: Negative.   Neurological: Negative for dizziness.  Psychiatric/Behavioral: The patient is not nervous/anxious.  Physical Exam Constitutional:       General: She is not in acute distress.    Appearance: She is well-developed and well-nourished. She is not diaphoretic.  Neck:     Thyroid: No thyromegaly.  Cardiovascular:     Rate and Rhythm: Normal rate and regular rhythm.     Pulses: Normal pulses and intact distal pulses.     Heart sounds: Normal heart sounds.  Pulmonary:     Effort: Pulmonary effort is normal. No respiratory distress.     Breath sounds: Normal breath sounds.  Abdominal:     General: Bowel sounds are normal. There is no distension.     Palpations: Abdomen is soft.     Tenderness: There is no abdominal tenderness.  Musculoskeletal:        General: No edema. Normal range of motion.     Cervical back: Neck supple.     Right lower leg: No edema.     Left lower leg: No edema.  Lymphadenopathy:     Cervical: No cervical adenopathy.  Skin:    General: Skin is warm and dry.     Comments: Chest wall dialysis access   Neurological:     Mental Status: She is alert. Mental status is at baseline.  Psychiatric:        Mood and Affect: Mood and affect and mood normal.       ASSESSMENT/ PLAN:  TODAY  1. Herpes: no reports of outbreaks; will continue acyclovir 200 mg daily   2. Vascular dementia with behavioral disturbance: is without change weight is 139 pounds will monitor   3. Diabetes with end stage renal disease (ESRD) is stable hgb a1c 8.0 will continue novolog 10 units with breakfast and supper; 15 units at lunch basaglar 10 units nightly   PREVIOUS   4. Hyperkalemia: k+ 6.0 without change. Will continue  diet  low potassium   5. Chronic kidney disease with end stage renal disease on dialysis due to type 2 diabetes mellitus/dependence on dialysis; will continue dialysis three times weekly will continue phoshyra 667 mg 2 tabs with meals one with snacks.   6. Hypertension; due to end stage renal disease due to type 2 diabetes mellitus: is stable b/p 155/83 will continue norvasc 10 mg four times weekly  and clonidine 0.3 mg twice daily asa 81 mg daily   7. Stage 1 infiltrating ductal carcinoma of right female breast: is stable is followed by oncology; will continue armidex 1 mg daily   8. Multiple myeloma no achieving remission: is without change is followed by oncology  9. Anemia due to end stage renal disease: is stable hgb 9.9 will monitor     MD is aware of resident's narcotic use and is in agreement with current plan of care. We will attempt to wean resident as appropriate.  Ok Edwards NP [facility]  Contact [phone] Monday through Friday 8am- 5pm  After hours call [phone]

## 2020-02-19 DIAGNOSIS — N186 End stage renal disease: Secondary | ICD-10-CM | POA: Diagnosis not present

## 2020-02-19 DIAGNOSIS — Z992 Dependence on renal dialysis: Secondary | ICD-10-CM | POA: Diagnosis not present

## 2020-02-19 DIAGNOSIS — Z23 Encounter for immunization: Secondary | ICD-10-CM | POA: Diagnosis not present

## 2020-02-20 ENCOUNTER — Inpatient Hospital Stay (HOSPITAL_COMMUNITY): Payer: Medicare PPO

## 2020-02-20 ENCOUNTER — Ambulatory Visit (HOSPITAL_COMMUNITY)
Admission: RE | Admit: 2020-02-20 | Discharge: 2020-02-20 | Disposition: A | Discharge status: Home / Self Care | Payer: Medicare PPO | Source: Ambulatory Visit | Attending: Vascular Surgery | Admitting: Vascular Surgery

## 2020-02-20 ENCOUNTER — Encounter (HOSPITAL_COMMUNITY): Payer: Self-pay

## 2020-02-20 ENCOUNTER — Other Ambulatory Visit: Payer: Self-pay

## 2020-02-20 ENCOUNTER — Inpatient Hospital Stay (HOSPITAL_COMMUNITY): Payer: Medicare PPO | Attending: Hematology

## 2020-02-20 VITALS — BP 113/56 | HR 78 | Temp 97.1°F | Resp 17 | Wt 139.0 lb

## 2020-02-20 DIAGNOSIS — N184 Chronic kidney disease, stage 4 (severe): Secondary | ICD-10-CM | POA: Insufficient documentation

## 2020-02-20 DIAGNOSIS — C9000 Multiple myeloma not having achieved remission: Secondary | ICD-10-CM | POA: Insufficient documentation

## 2020-02-20 DIAGNOSIS — Z5112 Encounter for antineoplastic immunotherapy: Secondary | ICD-10-CM | POA: Diagnosis not present

## 2020-02-20 DIAGNOSIS — N186 End stage renal disease: Secondary | ICD-10-CM | POA: Diagnosis not present

## 2020-02-20 DIAGNOSIS — I82C21 Chronic embolism and thrombosis of right internal jugular vein: Secondary | ICD-10-CM | POA: Diagnosis not present

## 2020-02-20 DIAGNOSIS — Z992 Dependence on renal dialysis: Secondary | ICD-10-CM | POA: Diagnosis not present

## 2020-02-20 LAB — COMPREHENSIVE METABOLIC PANEL
ALT: 5 U/L (ref 0–44)
AST: 17 U/L (ref 15–41)
Albumin: 3.6 g/dL (ref 3.5–5.0)
Alkaline Phosphatase: 84 U/L (ref 38–126)
Anion gap: 12 (ref 5–15)
BUN: 33 mg/dL — ABNORMAL HIGH (ref 8–23)
CO2: 28 mmol/L (ref 22–32)
Calcium: 9 mg/dL (ref 8.9–10.3)
Chloride: 96 mmol/L — ABNORMAL LOW (ref 98–111)
Creatinine, Ser: 6.03 mg/dL — ABNORMAL HIGH (ref 0.44–1.00)
GFR, Estimated: 7 mL/min — ABNORMAL LOW (ref >60)
Glucose, Bld: 151 mg/dL — ABNORMAL HIGH (ref 70–99)
Potassium: 3.7 mmol/L (ref 3.5–5.1)
Sodium: 136 mmol/L (ref 135–145)
Total Bilirubin: 0.4 mg/dL (ref 0.3–1.2)
Total Protein: 6.8 g/dL (ref 6.5–8.1)

## 2020-02-20 LAB — CBC WITH DIFFERENTIAL/PLATELET
Abs Immature Granulocytes: 0.02 10*3/uL (ref 0.00–0.07)
Basophils Absolute: 0 10*3/uL (ref 0.0–0.1)
Basophils Relative: 1 %
Eosinophils Absolute: 0.3 10*3/uL (ref 0.0–0.5)
Eosinophils Relative: 6 %
HCT: 32.6 % — ABNORMAL LOW (ref 36.0–46.0)
Hemoglobin: 10.1 g/dL — ABNORMAL LOW (ref 12.0–15.0)
Immature Granulocytes: 0 %
Lymphocytes Relative: 19 %
Lymphs Abs: 1.1 10*3/uL (ref 0.7–4.0)
MCH: 31.6 pg (ref 26.0–34.0)
MCHC: 31 g/dL (ref 30.0–36.0)
MCV: 101.9 fL — ABNORMAL HIGH (ref 80.0–100.0)
Monocytes Absolute: 0.4 10*3/uL (ref 0.1–1.0)
Monocytes Relative: 8 %
Neutro Abs: 4 10*3/uL (ref 1.7–7.7)
Neutrophils Relative %: 66 %
Platelets: 308 10*3/uL (ref 150–400)
RBC: 3.2 MIL/uL — ABNORMAL LOW (ref 3.87–5.11)
RDW: 16.6 % — ABNORMAL HIGH (ref 11.5–15.5)
WBC: 5.9 10*3/uL (ref 4.0–10.5)
nRBC: 0 % (ref 0.0–0.2)

## 2020-02-20 MED ORDER — DEXAMETHASONE 4 MG PO TABS
10.0000 mg | ORAL_TABLET | Freq: Once | ORAL | Status: AC
  Administered 2020-02-20: 10 mg via ORAL
  Filled 2020-02-20: qty 3

## 2020-02-20 MED ORDER — CYANOCOBALAMIN 1000 MCG/ML IJ SOLN
INTRAMUSCULAR | Status: AC
  Filled 2020-02-20: qty 1

## 2020-02-20 MED ORDER — BORTEZOMIB CHEMO SQ INJECTION 3.5 MG (2.5MG/ML)
1.3000 mg/m2 | Freq: Once | INTRAMUSCULAR | Status: AC
  Administered 2020-02-20: 2.25 mg via SUBCUTANEOUS
  Filled 2020-02-20: qty 0.9

## 2020-02-20 MED ORDER — PROCHLORPERAZINE MALEATE 10 MG PO TABS
10.0000 mg | ORAL_TABLET | Freq: Once | ORAL | Status: AC
  Administered 2020-02-20: 10 mg via ORAL
  Filled 2020-02-20: qty 1

## 2020-02-20 NOTE — Progress Notes (Signed)
Patient tolerated Velcade injection with no complaints voiced.  Lab work reviewed.  See MAR for details.  Injection site clean and dry with no bruising or swelling noted.  Band aid applied.  VSS.  Patient left in satisfactory condition with no s/s of distress noted.  

## 2020-02-21 DIAGNOSIS — N186 End stage renal disease: Secondary | ICD-10-CM | POA: Diagnosis not present

## 2020-02-21 DIAGNOSIS — Z23 Encounter for immunization: Secondary | ICD-10-CM | POA: Diagnosis not present

## 2020-02-21 DIAGNOSIS — Z992 Dependence on renal dialysis: Secondary | ICD-10-CM | POA: Diagnosis not present

## 2020-02-23 DIAGNOSIS — Z992 Dependence on renal dialysis: Secondary | ICD-10-CM | POA: Diagnosis not present

## 2020-02-23 DIAGNOSIS — Z23 Encounter for immunization: Secondary | ICD-10-CM | POA: Diagnosis not present

## 2020-02-23 DIAGNOSIS — N186 End stage renal disease: Secondary | ICD-10-CM | POA: Diagnosis not present

## 2020-02-26 ENCOUNTER — Ambulatory Visit: Payer: Medicare PPO | Admitting: Vascular Surgery

## 2020-02-26 DIAGNOSIS — N186 End stage renal disease: Secondary | ICD-10-CM | POA: Diagnosis not present

## 2020-02-26 DIAGNOSIS — Z23 Encounter for immunization: Secondary | ICD-10-CM | POA: Diagnosis not present

## 2020-02-26 DIAGNOSIS — Z992 Dependence on renal dialysis: Secondary | ICD-10-CM | POA: Diagnosis not present

## 2020-02-27 ENCOUNTER — Inpatient Hospital Stay (HOSPITAL_COMMUNITY): Payer: Medicare PPO

## 2020-02-27 ENCOUNTER — Encounter (HOSPITAL_COMMUNITY): Payer: Self-pay

## 2020-02-27 ENCOUNTER — Other Ambulatory Visit: Payer: Self-pay

## 2020-02-27 VITALS — BP 91/52 | HR 85 | Temp 96.8°F | Resp 17 | Wt 133.4 lb

## 2020-02-27 DIAGNOSIS — Z5112 Encounter for antineoplastic immunotherapy: Secondary | ICD-10-CM | POA: Diagnosis not present

## 2020-02-27 DIAGNOSIS — C9000 Multiple myeloma not having achieved remission: Secondary | ICD-10-CM

## 2020-02-27 LAB — CBC WITH DIFFERENTIAL/PLATELET
Abs Immature Granulocytes: 0.01 10*3/uL (ref 0.00–0.07)
Basophils Absolute: 0 10*3/uL (ref 0.0–0.1)
Basophils Relative: 1 %
Eosinophils Absolute: 0.3 10*3/uL (ref 0.0–0.5)
Eosinophils Relative: 4 %
HCT: 33.6 % — ABNORMAL LOW (ref 36.0–46.0)
Hemoglobin: 10.2 g/dL — ABNORMAL LOW (ref 12.0–15.0)
Immature Granulocytes: 0 %
Lymphocytes Relative: 22 %
Lymphs Abs: 1.3 10*3/uL (ref 0.7–4.0)
MCH: 31.4 pg (ref 26.0–34.0)
MCHC: 30.4 g/dL (ref 30.0–36.0)
MCV: 103.4 fL — ABNORMAL HIGH (ref 80.0–100.0)
Monocytes Absolute: 0.7 10*3/uL (ref 0.1–1.0)
Monocytes Relative: 11 %
Neutro Abs: 3.8 10*3/uL (ref 1.7–7.7)
Neutrophils Relative %: 62 %
Platelets: 280 10*3/uL (ref 150–400)
RBC: 3.25 MIL/uL — ABNORMAL LOW (ref 3.87–5.11)
RDW: 17.3 % — ABNORMAL HIGH (ref 11.5–15.5)
WBC: 6.1 10*3/uL (ref 4.0–10.5)
nRBC: 0 % (ref 0.0–0.2)

## 2020-02-27 LAB — COMPREHENSIVE METABOLIC PANEL
ALT: 8 U/L (ref 0–44)
AST: 16 U/L (ref 15–41)
Albumin: 3.7 g/dL (ref 3.5–5.0)
Alkaline Phosphatase: 78 U/L (ref 38–126)
Anion gap: 14 (ref 5–15)
BUN: 45 mg/dL — ABNORMAL HIGH (ref 8–23)
CO2: 27 mmol/L (ref 22–32)
Calcium: 8.6 mg/dL — ABNORMAL LOW (ref 8.9–10.3)
Chloride: 92 mmol/L — ABNORMAL LOW (ref 98–111)
Creatinine, Ser: 7.28 mg/dL — ABNORMAL HIGH (ref 0.44–1.00)
GFR, Estimated: 5 mL/min — ABNORMAL LOW (ref >60)
Glucose, Bld: 286 mg/dL — ABNORMAL HIGH (ref 70–99)
Potassium: 4.3 mmol/L (ref 3.5–5.1)
Sodium: 133 mmol/L — ABNORMAL LOW (ref 135–145)
Total Bilirubin: 0.4 mg/dL (ref 0.3–1.2)
Total Protein: 6.7 g/dL (ref 6.5–8.1)

## 2020-02-27 MED ORDER — DEXAMETHASONE 4 MG PO TABS
10.0000 mg | ORAL_TABLET | Freq: Once | ORAL | Status: AC
  Administered 2020-02-27: 10 mg via ORAL
  Filled 2020-02-27: qty 3

## 2020-02-27 MED ORDER — BORTEZOMIB CHEMO SQ INJECTION 3.5 MG (2.5MG/ML)
1.3000 mg/m2 | Freq: Once | INTRAMUSCULAR | Status: AC
  Administered 2020-02-27: 2.25 mg via SUBCUTANEOUS
  Filled 2020-02-27: qty 0.9

## 2020-02-27 MED ORDER — PROCHLORPERAZINE MALEATE 10 MG PO TABS
10.0000 mg | ORAL_TABLET | Freq: Once | ORAL | Status: AC
  Administered 2020-02-27: 10 mg via ORAL
  Filled 2020-02-27: qty 1

## 2020-02-27 NOTE — Progress Notes (Signed)
[place] reviewed with Dr Park Liter and pt approved for Velcade injection today per MD                                                                     Carly Wood tolerated Velcade injection well without complaints or incident. VSS Pt discharged via wheelchair in satisfactory condition

## 2020-02-27 NOTE — Patient Instructions (Signed)
[facility] Discharge Instructions for Patients Receiving Chemotherapy   Beginning January 23rd 2017 lab work for the Cancer Center will be done in the  Main lab at [place] on 1st floor. If you have a lab appointment with the Cancer Center please come in thru the  Main Entrance and check in at the main information desk   Today you received the following chemotherapy agents Velcade injection. Follow-up as scheduled  To help prevent nausea and vomiting after your treatment, we encourage you to take your nausea medication   If you develop nausea and vomiting, or diarrhea that is not controlled by your medication, call the clinic.  The clinic phone number is [phone]. Office hours are Monday-Friday 8:30am-5:00pm.  BELOW ARE SYMPTOMS THAT SHOULD BE REPORTED IMMEDIATELY:  *FEVER GREATER THAN 101.0 F  *CHILLS WITH OR WITHOUT FEVER  NAUSEA AND VOMITING THAT IS NOT CONTROLLED WITH YOUR NAUSEA MEDICATION  *UNUSUAL SHORTNESS OF BREATH  *UNUSUAL BRUISING OR BLEEDING  TENDERNESS IN MOUTH AND THROAT WITH OR WITHOUT PRESENCE OF ULCERS  *URINARY PROBLEMS  *BOWEL PROBLEMS  UNUSUAL RASH Items with * indicate a potential emergency and should be followed up as soon as possible. If you have an emergency after office hours please contact your primary care physician or go to the nearest emergency department.  Please call the clinic during office hours if you have any questions or concerns.   You may also contact the Patient Navigator at [phone] should you have any questions or need assistance in obtaining follow up care.      Resources For Cancer Patients and their Caregivers ? American Cancer Society: Can assist with transportation, wigs, general needs, runs Look Good Feel Better.        1-888-227-6333 ? Cancer Care: Provides financial assistance, online support groups, medication/co-pay assistance.  1-800-813-HOPE (4673) ? [facility] Assists [place] cancer patients and their families through emotional , educational and financial support.  336-427-4357 ? [place] DSS Where to apply for food stamps, Medicaid and utility assistance. 336-342-1394 ? [facility]: Transportation to medical appointments. 336-347-2287 ? Social Security Administration: May apply for disability if have a Stage IV cancer. 336-342-7796 1-800-772-1213 ? [place] Aging, Disability and Transit Services: Assists with nutrition, care and transit needs. 336-349-2343         

## 2020-02-28 DIAGNOSIS — Z23 Encounter for immunization: Secondary | ICD-10-CM | POA: Diagnosis not present

## 2020-02-28 DIAGNOSIS — Z992 Dependence on renal dialysis: Secondary | ICD-10-CM | POA: Diagnosis not present

## 2020-02-28 DIAGNOSIS — N186 End stage renal disease: Secondary | ICD-10-CM | POA: Diagnosis not present

## 2020-03-01 DIAGNOSIS — Z992 Dependence on renal dialysis: Secondary | ICD-10-CM | POA: Diagnosis not present

## 2020-03-01 DIAGNOSIS — N186 End stage renal disease: Secondary | ICD-10-CM | POA: Diagnosis not present

## 2020-03-01 DIAGNOSIS — Z23 Encounter for immunization: Secondary | ICD-10-CM | POA: Diagnosis not present

## 2020-03-04 DIAGNOSIS — N186 End stage renal disease: Secondary | ICD-10-CM | POA: Diagnosis not present

## 2020-03-04 DIAGNOSIS — E119 Type 2 diabetes mellitus without complications: Secondary | ICD-10-CM | POA: Diagnosis not present

## 2020-03-04 DIAGNOSIS — Z992 Dependence on renal dialysis: Secondary | ICD-10-CM | POA: Diagnosis not present

## 2020-03-04 DIAGNOSIS — Z23 Encounter for immunization: Secondary | ICD-10-CM | POA: Diagnosis not present

## 2020-03-05 ENCOUNTER — Other Ambulatory Visit: Payer: Self-pay

## 2020-03-05 ENCOUNTER — Inpatient Hospital Stay (HOSPITAL_COMMUNITY): Payer: Medicare PPO

## 2020-03-05 VITALS — BP 92/51 | HR 72 | Temp 97.3°F | Resp 17 | Wt 140.0 lb

## 2020-03-05 DIAGNOSIS — Z5112 Encounter for antineoplastic immunotherapy: Secondary | ICD-10-CM | POA: Diagnosis not present

## 2020-03-05 DIAGNOSIS — C9000 Multiple myeloma not having achieved remission: Secondary | ICD-10-CM

## 2020-03-05 LAB — COMPREHENSIVE METABOLIC PANEL
ALT: 13 U/L (ref 0–44)
AST: 22 U/L (ref 15–41)
Albumin: 3.6 g/dL (ref 3.5–5.0)
Alkaline Phosphatase: 85 U/L (ref 38–126)
Anion gap: 14 (ref 5–15)
BUN: 53 mg/dL — ABNORMAL HIGH (ref 8–23)
CO2: 25 mmol/L (ref 22–32)
Calcium: 8.5 mg/dL — ABNORMAL LOW (ref 8.9–10.3)
Chloride: 94 mmol/L — ABNORMAL LOW (ref 98–111)
Creatinine, Ser: 7.23 mg/dL — ABNORMAL HIGH (ref 0.44–1.00)
GFR, Estimated: 5 mL/min — ABNORMAL LOW (ref >60)
Glucose, Bld: 297 mg/dL — ABNORMAL HIGH (ref 70–99)
Potassium: 5.8 mmol/L — ABNORMAL HIGH (ref 3.5–5.1)
Sodium: 133 mmol/L — ABNORMAL LOW (ref 135–145)
Total Bilirubin: 0.4 mg/dL (ref 0.3–1.2)
Total Protein: 6.6 g/dL (ref 6.5–8.1)

## 2020-03-05 LAB — CBC WITH DIFFERENTIAL/PLATELET
Abs Immature Granulocytes: 0.03 10*3/uL (ref 0.00–0.07)
Basophils Absolute: 0.1 10*3/uL (ref 0.0–0.1)
Basophils Relative: 1 %
Eosinophils Absolute: 0.2 10*3/uL (ref 0.0–0.5)
Eosinophils Relative: 3 %
HCT: 35.5 % — ABNORMAL LOW (ref 36.0–46.0)
Hemoglobin: 10.8 g/dL — ABNORMAL LOW (ref 12.0–15.0)
Immature Granulocytes: 0 %
Lymphocytes Relative: 17 %
Lymphs Abs: 1.4 10*3/uL (ref 0.7–4.0)
MCH: 31.6 pg (ref 26.0–34.0)
MCHC: 30.4 g/dL (ref 30.0–36.0)
MCV: 103.8 fL — ABNORMAL HIGH (ref 80.0–100.0)
Monocytes Absolute: 0.5 10*3/uL (ref 0.1–1.0)
Monocytes Relative: 7 %
Neutro Abs: 5.8 10*3/uL (ref 1.7–7.7)
Neutrophils Relative %: 72 %
Platelets: 256 10*3/uL (ref 150–400)
RBC: 3.42 MIL/uL — ABNORMAL LOW (ref 3.87–5.11)
RDW: 18.1 % — ABNORMAL HIGH (ref 11.5–15.5)
WBC: 7.9 10*3/uL (ref 4.0–10.5)
nRBC: 0 % (ref 0.0–0.2)

## 2020-03-05 MED ORDER — PROCHLORPERAZINE MALEATE 10 MG PO TABS
10.0000 mg | ORAL_TABLET | Freq: Once | ORAL | Status: AC
  Administered 2020-03-05: 10 mg via ORAL
  Filled 2020-03-05: qty 1

## 2020-03-05 MED ORDER — DEXAMETHASONE 4 MG PO TABS
10.0000 mg | ORAL_TABLET | Freq: Once | ORAL | Status: AC
  Administered 2020-03-05: 10 mg via ORAL
  Filled 2020-03-05: qty 3

## 2020-03-05 MED ORDER — BORTEZOMIB CHEMO SQ INJECTION 3.5 MG (2.5MG/ML)
1.3000 mg/m2 | Freq: Once | INTRAMUSCULAR | Status: AC
  Administered 2020-03-05: 2.25 mg via SUBCUTANEOUS
  Filled 2020-03-05: qty 0.9

## 2020-03-05 NOTE — Patient Instructions (Signed)
[facility] Discharge Instructions for Patients Receiving Chemotherapy   Beginning January 23rd 2017 lab work for the Cancer Center will be done in the  Main lab at [place] on 1st floor. If you have a lab appointment with the Cancer Center please come in thru the  Main Entrance and check in at the main information desk   Today you received the following chemotherapy agents Velcade  To help prevent nausea and vomiting after your treatment, we encourage you to take your nausea medication    If you develop nausea and vomiting, or diarrhea that is not controlled by your medication, call the clinic.  The clinic phone number is [phone]. Office hours are Monday-Friday 8:30am-5:00pm.  BELOW ARE SYMPTOMS THAT SHOULD BE REPORTED IMMEDIATELY:  *FEVER GREATER THAN 101.0 F  *CHILLS WITH OR WITHOUT FEVER  NAUSEA AND VOMITING THAT IS NOT CONTROLLED WITH YOUR NAUSEA MEDICATION  *UNUSUAL SHORTNESS OF BREATH  *UNUSUAL BRUISING OR BLEEDING  TENDERNESS IN MOUTH AND THROAT WITH OR WITHOUT PRESENCE OF ULCERS  *URINARY PROBLEMS  *BOWEL PROBLEMS  UNUSUAL RASH Items with * indicate a potential emergency and should be followed up as soon as possible. If you have an emergency after office hours please contact your primary care physician or go to the nearest emergency department.  Please call the clinic during office hours if you have any questions or concerns.   You may also contact the Patient Navigator at [phone] should you have any questions or need assistance in obtaining follow up care.      Resources For Cancer Patients and their Caregivers ? American Cancer Society: Can assist with transportation, wigs, general needs, runs Look Good Feel Better.        1-888-227-6333 ? Cancer Care: Provides financial assistance, online support groups, medication/co-pay assistance.  1-800-813-HOPE (4673) ? [facility] Assists [place] cancer  patients and their families through emotional , educational and financial support.  336-427-4357 ? [place] DSS Where to apply for food stamps, Medicaid and utility assistance. 336-342-1394 ? [facility]: Transportation to medical appointments. 336-347-2287 ? Social Security Administration: May apply for disability if have a Stage IV cancer. 336-342-7796 1-800-772-1213 ? [place] Aging, Disability and Transit Services: Assists with nutrition, care and transit needs. 336-349-2343          

## 2020-03-05 NOTE — Progress Notes (Signed)
Carly Wood presents today for [phone] Velcade. Pt denies any new changes or symptoms since last treatment. Lab results and vitals have been reviewed including Cr 7.23 and K 5.8. Pt is on hemodialysis and this is her baseline. Per parameters, proceeding with treatment today as planned.  Injection tolerated without incident or complaint.Discharged in satisfactory condition with follow up instructions.

## 2020-03-06 DIAGNOSIS — Z23 Encounter for immunization: Secondary | ICD-10-CM | POA: Diagnosis not present

## 2020-03-06 DIAGNOSIS — Z992 Dependence on renal dialysis: Secondary | ICD-10-CM | POA: Diagnosis not present

## 2020-03-06 DIAGNOSIS — Z1159 Encounter for screening for other viral diseases: Secondary | ICD-10-CM | POA: Diagnosis not present

## 2020-03-06 DIAGNOSIS — C9000 Multiple myeloma not having achieved remission: Secondary | ICD-10-CM | POA: Diagnosis not present

## 2020-03-06 DIAGNOSIS — N186 End stage renal disease: Secondary | ICD-10-CM | POA: Diagnosis not present

## 2020-03-06 MED ORDER — OCTREOTIDE ACETATE 30 MG IM KIT
PACK | INTRAMUSCULAR | Status: AC
  Filled 2020-03-06: qty 1

## 2020-03-08 DIAGNOSIS — N186 End stage renal disease: Secondary | ICD-10-CM | POA: Diagnosis not present

## 2020-03-08 DIAGNOSIS — Z992 Dependence on renal dialysis: Secondary | ICD-10-CM | POA: Diagnosis not present

## 2020-03-08 DIAGNOSIS — Z23 Encounter for immunization: Secondary | ICD-10-CM | POA: Diagnosis not present

## 2020-03-11 DIAGNOSIS — N186 End stage renal disease: Secondary | ICD-10-CM | POA: Diagnosis not present

## 2020-03-11 DIAGNOSIS — Z992 Dependence on renal dialysis: Secondary | ICD-10-CM | POA: Diagnosis not present

## 2020-03-11 DIAGNOSIS — Z23 Encounter for immunization: Secondary | ICD-10-CM | POA: Diagnosis not present

## 2020-03-11 DIAGNOSIS — Z1159 Encounter for screening for other viral diseases: Secondary | ICD-10-CM | POA: Diagnosis not present

## 2020-03-11 DIAGNOSIS — C9000 Multiple myeloma not having achieved remission: Secondary | ICD-10-CM | POA: Diagnosis not present

## 2020-03-13 DIAGNOSIS — N186 End stage renal disease: Secondary | ICD-10-CM | POA: Diagnosis not present

## 2020-03-13 DIAGNOSIS — Z23 Encounter for immunization: Secondary | ICD-10-CM | POA: Diagnosis not present

## 2020-03-13 DIAGNOSIS — Z992 Dependence on renal dialysis: Secondary | ICD-10-CM | POA: Diagnosis not present

## 2020-03-18 DIAGNOSIS — Z992 Dependence on renal dialysis: Secondary | ICD-10-CM | POA: Diagnosis not present

## 2020-03-18 DIAGNOSIS — N186 End stage renal disease: Secondary | ICD-10-CM | POA: Diagnosis not present

## 2020-03-18 DIAGNOSIS — Z23 Encounter for immunization: Secondary | ICD-10-CM | POA: Diagnosis not present

## 2020-03-19 ENCOUNTER — Encounter (HOSPITAL_COMMUNITY): Payer: Self-pay

## 2020-03-19 ENCOUNTER — Inpatient Hospital Stay (HOSPITAL_COMMUNITY): Payer: Medicare PPO | Attending: Hematology

## 2020-03-19 ENCOUNTER — Other Ambulatory Visit: Payer: Self-pay

## 2020-03-19 ENCOUNTER — Inpatient Hospital Stay (HOSPITAL_COMMUNITY): Payer: Medicare PPO

## 2020-03-19 VITALS — BP 112/57 | HR 82 | Temp 97.0°F | Resp 18 | Wt 132.1 lb

## 2020-03-19 DIAGNOSIS — Z5111 Encounter for antineoplastic chemotherapy: Secondary | ICD-10-CM | POA: Diagnosis not present

## 2020-03-19 DIAGNOSIS — F0390 Unspecified dementia without behavioral disturbance: Secondary | ICD-10-CM | POA: Diagnosis not present

## 2020-03-19 DIAGNOSIS — C9000 Multiple myeloma not having achieved remission: Secondary | ICD-10-CM

## 2020-03-19 DIAGNOSIS — M8580 Other specified disorders of bone density and structure, unspecified site: Secondary | ICD-10-CM | POA: Diagnosis not present

## 2020-03-19 DIAGNOSIS — I120 Hypertensive chronic kidney disease with stage 5 chronic kidney disease or end stage renal disease: Secondary | ICD-10-CM | POA: Diagnosis not present

## 2020-03-19 DIAGNOSIS — C50911 Malignant neoplasm of unspecified site of right female breast: Secondary | ICD-10-CM | POA: Insufficient documentation

## 2020-03-19 DIAGNOSIS — Z9011 Acquired absence of right breast and nipple: Secondary | ICD-10-CM | POA: Insufficient documentation

## 2020-03-19 DIAGNOSIS — E1122 Type 2 diabetes mellitus with diabetic chronic kidney disease: Secondary | ICD-10-CM | POA: Insufficient documentation

## 2020-03-19 DIAGNOSIS — K219 Gastro-esophageal reflux disease without esophagitis: Secondary | ICD-10-CM | POA: Insufficient documentation

## 2020-03-19 DIAGNOSIS — Z79811 Long term (current) use of aromatase inhibitors: Secondary | ICD-10-CM | POA: Insufficient documentation

## 2020-03-19 DIAGNOSIS — Z9221 Personal history of antineoplastic chemotherapy: Secondary | ICD-10-CM | POA: Insufficient documentation

## 2020-03-19 DIAGNOSIS — Z992 Dependence on renal dialysis: Secondary | ICD-10-CM | POA: Diagnosis not present

## 2020-03-19 DIAGNOSIS — N186 End stage renal disease: Secondary | ICD-10-CM | POA: Diagnosis not present

## 2020-03-19 LAB — CBC WITH DIFFERENTIAL/PLATELET
Abs Immature Granulocytes: 0.01 10*3/uL (ref 0.00–0.07)
Basophils Absolute: 0 10*3/uL (ref 0.0–0.1)
Basophils Relative: 1 %
Eosinophils Absolute: 0.2 10*3/uL (ref 0.0–0.5)
Eosinophils Relative: 4 %
HCT: 32.3 % — ABNORMAL LOW (ref 36.0–46.0)
Hemoglobin: 10 g/dL — ABNORMAL LOW (ref 12.0–15.0)
Immature Granulocytes: 0 %
Lymphocytes Relative: 25 %
Lymphs Abs: 1.2 10*3/uL (ref 0.7–4.0)
MCH: 32.2 pg (ref 26.0–34.0)
MCHC: 31 g/dL (ref 30.0–36.0)
MCV: 103.9 fL — ABNORMAL HIGH (ref 80.0–100.0)
Monocytes Absolute: 0.3 10*3/uL (ref 0.1–1.0)
Monocytes Relative: 6 %
Neutro Abs: 3.2 10*3/uL (ref 1.7–7.7)
Neutrophils Relative %: 64 %
Platelets: 270 10*3/uL (ref 150–400)
RBC: 3.11 MIL/uL — ABNORMAL LOW (ref 3.87–5.11)
RDW: 17.5 % — ABNORMAL HIGH (ref 11.5–15.5)
WBC: 5 10*3/uL (ref 4.0–10.5)
nRBC: 0 % (ref 0.0–0.2)

## 2020-03-19 LAB — COMPREHENSIVE METABOLIC PANEL
ALT: 9 U/L (ref 0–44)
AST: 15 U/L (ref 15–41)
Albumin: 3.6 g/dL (ref 3.5–5.0)
Alkaline Phosphatase: 83 U/L (ref 38–126)
Anion gap: 17 — ABNORMAL HIGH (ref 5–15)
BUN: 73 mg/dL — ABNORMAL HIGH (ref 8–23)
CO2: 22 mmol/L (ref 22–32)
Calcium: 8.2 mg/dL — ABNORMAL LOW (ref 8.9–10.3)
Chloride: 95 mmol/L — ABNORMAL LOW (ref 98–111)
Creatinine, Ser: 10.49 mg/dL — ABNORMAL HIGH (ref 0.44–1.00)
GFR, Estimated: 3 mL/min — ABNORMAL LOW (ref >60)
Glucose, Bld: 282 mg/dL — ABNORMAL HIGH (ref 70–99)
Potassium: 5.8 mmol/L — ABNORMAL HIGH (ref 3.5–5.1)
Sodium: 134 mmol/L — ABNORMAL LOW (ref 135–145)
Total Bilirubin: 0.5 mg/dL (ref 0.3–1.2)
Total Protein: 6.7 g/dL (ref 6.5–8.1)

## 2020-03-19 MED ORDER — PROCHLORPERAZINE MALEATE 10 MG PO TABS
10.0000 mg | ORAL_TABLET | Freq: Once | ORAL | Status: AC
  Administered 2020-03-19: 10 mg via ORAL
  Filled 2020-03-19: qty 1

## 2020-03-19 MED ORDER — BORTEZOMIB CHEMO SQ INJECTION 3.5 MG (2.5MG/ML)
1.3000 mg/m2 | Freq: Once | INTRAMUSCULAR | Status: AC
Start: 2020-03-19 — End: 2020-03-19
  Administered 2020-03-19: 2.25 mg via SUBCUTANEOUS
  Filled 2020-03-19: qty 0.9

## 2020-03-19 MED ORDER — DEXAMETHASONE 4 MG PO TABS
10.0000 mg | ORAL_TABLET | Freq: Once | ORAL | Status: AC
  Administered 2020-03-19: 10 mg via ORAL
  Filled 2020-03-19: qty 3

## 2020-03-19 NOTE — Progress Notes (Signed)
Patient tolerated Velcade injection with no complaints voiced.  Lab work reviewed.  See MAR for details.  Injection site clean and dry with no bruising or swelling noted.  Band aid applied.  VSS.  Patient left in satisfactory condition with no s/s of distress noted.  

## 2020-03-20 DIAGNOSIS — Z1159 Encounter for screening for other viral diseases: Secondary | ICD-10-CM | POA: Diagnosis not present

## 2020-03-20 DIAGNOSIS — C9000 Multiple myeloma not having achieved remission: Secondary | ICD-10-CM | POA: Diagnosis not present

## 2020-03-20 MED ORDER — DENOSUMAB 60 MG/ML ~~LOC~~ SOSY
PREFILLED_SYRINGE | SUBCUTANEOUS | Status: AC
  Filled 2020-03-20: qty 1

## 2020-03-20 MED ORDER — PEGFILGRASTIM 6 MG/0.6ML ~~LOC~~ PSKT
PREFILLED_SYRINGE | SUBCUTANEOUS | Status: AC
  Filled 2020-03-20: qty 0.6

## 2020-03-21 MED ORDER — LANREOTIDE ACETATE 120 MG/0.5ML ~~LOC~~ SOLN
SUBCUTANEOUS | Status: AC
  Filled 2020-03-21: qty 120

## 2020-03-22 DIAGNOSIS — N186 End stage renal disease: Secondary | ICD-10-CM | POA: Diagnosis not present

## 2020-03-22 DIAGNOSIS — Z992 Dependence on renal dialysis: Secondary | ICD-10-CM | POA: Diagnosis not present

## 2020-03-22 DIAGNOSIS — Z23 Encounter for immunization: Secondary | ICD-10-CM | POA: Diagnosis not present

## 2020-03-25 ENCOUNTER — Ambulatory Visit: Payer: Medicare PPO | Admitting: Vascular Surgery

## 2020-03-26 ENCOUNTER — Encounter (HOSPITAL_COMMUNITY): Payer: Self-pay

## 2020-03-26 ENCOUNTER — Inpatient Hospital Stay (HOSPITAL_COMMUNITY): Payer: Medicare PPO

## 2020-03-26 ENCOUNTER — Other Ambulatory Visit: Payer: Self-pay

## 2020-03-26 VITALS — BP 117/51 | HR 66 | Temp 97.3°F | Resp 18 | Wt 139.9 lb

## 2020-03-26 DIAGNOSIS — Z5111 Encounter for antineoplastic chemotherapy: Secondary | ICD-10-CM | POA: Diagnosis not present

## 2020-03-26 DIAGNOSIS — C9000 Multiple myeloma not having achieved remission: Secondary | ICD-10-CM

## 2020-03-26 DIAGNOSIS — Z79811 Long term (current) use of aromatase inhibitors: Secondary | ICD-10-CM | POA: Diagnosis not present

## 2020-03-26 DIAGNOSIS — Z992 Dependence on renal dialysis: Secondary | ICD-10-CM | POA: Diagnosis not present

## 2020-03-26 DIAGNOSIS — C50911 Malignant neoplasm of unspecified site of right female breast: Secondary | ICD-10-CM | POA: Diagnosis not present

## 2020-03-26 DIAGNOSIS — N186 End stage renal disease: Secondary | ICD-10-CM | POA: Diagnosis not present

## 2020-03-26 DIAGNOSIS — Z9221 Personal history of antineoplastic chemotherapy: Secondary | ICD-10-CM | POA: Diagnosis not present

## 2020-03-26 DIAGNOSIS — M8580 Other specified disorders of bone density and structure, unspecified site: Secondary | ICD-10-CM | POA: Diagnosis not present

## 2020-03-26 DIAGNOSIS — Z9011 Acquired absence of right breast and nipple: Secondary | ICD-10-CM | POA: Diagnosis not present

## 2020-03-26 LAB — COMPREHENSIVE METABOLIC PANEL
ALT: 10 U/L (ref 0–44)
AST: 13 U/L — ABNORMAL LOW (ref 15–41)
Albumin: 3.4 g/dL — ABNORMAL LOW (ref 3.5–5.0)
Alkaline Phosphatase: 72 U/L (ref 38–126)
Anion gap: 17 — ABNORMAL HIGH (ref 5–15)
BUN: 101 mg/dL — ABNORMAL HIGH (ref 8–23)
CO2: 21 mmol/L — ABNORMAL LOW (ref 22–32)
Calcium: 8.1 mg/dL — ABNORMAL LOW (ref 8.9–10.3)
Chloride: 97 mmol/L — ABNORMAL LOW (ref 98–111)
Creatinine, Ser: 14.22 mg/dL — ABNORMAL HIGH (ref 0.44–1.00)
GFR, Estimated: 2 mL/min — ABNORMAL LOW (ref >60)
Glucose, Bld: 223 mg/dL — ABNORMAL HIGH (ref 70–99)
Potassium: 5.8 mmol/L — ABNORMAL HIGH (ref 3.5–5.1)
Sodium: 135 mmol/L (ref 135–145)
Total Bilirubin: 0.4 mg/dL (ref 0.3–1.2)
Total Protein: 6.4 g/dL — ABNORMAL LOW (ref 6.5–8.1)

## 2020-03-26 LAB — CBC WITH DIFFERENTIAL/PLATELET
Abs Immature Granulocytes: 0.02 10*3/uL (ref 0.00–0.07)
Basophils Absolute: 0 10*3/uL (ref 0.0–0.1)
Basophils Relative: 1 %
Eosinophils Absolute: 0.3 10*3/uL (ref 0.0–0.5)
Eosinophils Relative: 5 %
HCT: 30.4 % — ABNORMAL LOW (ref 36.0–46.0)
Hemoglobin: 9.4 g/dL — ABNORMAL LOW (ref 12.0–15.0)
Immature Granulocytes: 0 %
Lymphocytes Relative: 16 %
Lymphs Abs: 1 10*3/uL (ref 0.7–4.0)
MCH: 32.2 pg (ref 26.0–34.0)
MCHC: 30.9 g/dL (ref 30.0–36.0)
MCV: 104.1 fL — ABNORMAL HIGH (ref 80.0–100.0)
Monocytes Absolute: 0.5 10*3/uL (ref 0.1–1.0)
Monocytes Relative: 8 %
Neutro Abs: 4.4 10*3/uL (ref 1.7–7.7)
Neutrophils Relative %: 70 %
Platelets: 300 10*3/uL (ref 150–400)
RBC: 2.92 MIL/uL — ABNORMAL LOW (ref 3.87–5.11)
RDW: 17 % — ABNORMAL HIGH (ref 11.5–15.5)
WBC: 6.3 10*3/uL (ref 4.0–10.5)
nRBC: 0 % (ref 0.0–0.2)

## 2020-03-26 LAB — LACTATE DEHYDROGENASE: LDH: 154 U/L (ref 98–192)

## 2020-03-26 MED ORDER — BORTEZOMIB CHEMO SQ INJECTION 3.5 MG (2.5MG/ML)
1.3000 mg/m2 | Freq: Once | INTRAMUSCULAR | Status: AC
  Administered 2020-03-26: 2.25 mg via SUBCUTANEOUS
  Filled 2020-03-26: qty 0.9

## 2020-03-26 MED ORDER — PROCHLORPERAZINE MALEATE 10 MG PO TABS
10.0000 mg | ORAL_TABLET | Freq: Once | ORAL | Status: AC
Start: 2020-03-26 — End: 2020-03-26
  Administered 2020-03-26: 10 mg via ORAL
  Filled 2020-03-26: qty 1

## 2020-03-26 MED ORDER — DEXAMETHASONE 4 MG PO TABS
10.0000 mg | ORAL_TABLET | Freq: Once | ORAL | Status: AC
  Administered 2020-03-26: 10 mg via ORAL
  Filled 2020-03-26: qty 3

## 2020-03-26 NOTE — Progress Notes (Signed)
[place] reviewed with Dr. Delton Coombes and pt approved for Velcade injection today per MD                                                       Carly Wood tolerated Velcade injection well without complaints or incident.VSS Pt discharged via wheelchair in satisfactory condition

## 2020-03-26 NOTE — Patient Instructions (Signed)
[facility] Discharge Instructions for Patients Receiving Chemotherapy   Beginning January 23rd 2017 lab work for the Cancer Center will be done in the  Main lab at [place] on 1st floor. If you have a lab appointment with the Cancer Center please come in thru the  Main Entrance and check in at the main information desk   Today you received the following chemotherapy agents Velcade. Follow-up as scheduled  To help prevent nausea and vomiting after your treatment, we encourage you to take your nausea medication   If you develop nausea and vomiting, or diarrhea that is not controlled by your medication, call the clinic.  The clinic phone number is [phone]. Office hours are Monday-Friday 8:30am-5:00pm.  BELOW ARE SYMPTOMS THAT SHOULD BE REPORTED IMMEDIATELY:  *FEVER GREATER THAN 101.0 F  *CHILLS WITH OR WITHOUT FEVER  NAUSEA AND VOMITING THAT IS NOT CONTROLLED WITH YOUR NAUSEA MEDICATION  *UNUSUAL SHORTNESS OF BREATH  *UNUSUAL BRUISING OR BLEEDING  TENDERNESS IN MOUTH AND THROAT WITH OR WITHOUT PRESENCE OF ULCERS  *URINARY PROBLEMS  *BOWEL PROBLEMS  UNUSUAL RASH Items with * indicate a potential emergency and should be followed up as soon as possible. If you have an emergency after office hours please contact your primary care physician or go to the nearest emergency department.  Please call the clinic during office hours if you have any questions or concerns.   You may also contact the Patient Navigator at [phone] should you have any questions or need assistance in obtaining follow up care.      Resources For Cancer Patients and their Caregivers ? American Cancer Society: Can assist with transportation, wigs, general needs, runs Look Good Feel Better.        1-888-227-6333 ? Cancer Care: Provides financial assistance, online support groups, medication/co-pay assistance.  1-800-813-HOPE (4673) ? [facility] Assists [place] cancer patients and their families through emotional , educational and financial support.  336-427-4357 ? [place] DSS Where to apply for food stamps, Medicaid and utility assistance. 336-342-1394 ? [facility]: Transportation to medical appointments. 336-347-2287 ? Social Security Administration: May apply for disability if have a Stage IV cancer. 336-342-7796 1-800-772-1213 ? [place] Aging, Disability and Transit Services: Assists with nutrition, care and transit needs. 336-349-2343         

## 2020-03-27 ENCOUNTER — Non-Acute Institutional Stay (SKILLED_NURSING_FACILITY): Payer: Medicare PPO | Admitting: Adult Health

## 2020-03-27 ENCOUNTER — Encounter: Payer: Self-pay | Admitting: Adult Health

## 2020-03-27 DIAGNOSIS — E875 Hyperkalemia: Secondary | ICD-10-CM

## 2020-03-27 DIAGNOSIS — N186 End stage renal disease: Secondary | ICD-10-CM

## 2020-03-27 DIAGNOSIS — I70209 Unspecified atherosclerosis of native arteries of extremities, unspecified extremity: Secondary | ICD-10-CM

## 2020-03-27 DIAGNOSIS — E1122 Type 2 diabetes mellitus with diabetic chronic kidney disease: Secondary | ICD-10-CM

## 2020-03-27 DIAGNOSIS — I700 Atherosclerosis of aorta: Secondary | ICD-10-CM | POA: Diagnosis not present

## 2020-03-27 DIAGNOSIS — Z23 Encounter for immunization: Secondary | ICD-10-CM | POA: Diagnosis not present

## 2020-03-27 DIAGNOSIS — Z992 Dependence on renal dialysis: Secondary | ICD-10-CM

## 2020-03-27 LAB — PROTEIN ELECTROPHORESIS, SERUM
A/G Ratio: 1.4 (ref 0.7–1.7)
Albumin ELP: 3.4 g/dL (ref 2.9–4.4)
Alpha-1-Globulin: 0.3 g/dL (ref 0.0–0.4)
Alpha-2-Globulin: 0.8 g/dL (ref 0.4–1.0)
Beta Globulin: 0.9 g/dL (ref 0.7–1.3)
Gamma Globulin: 0.6 g/dL (ref 0.4–1.8)
Globulin, Total: 2.5 g/dL (ref 2.2–3.9)
Total Protein ELP: 5.9 g/dL — ABNORMAL LOW (ref 6.0–8.5)

## 2020-03-27 LAB — KAPPA/LAMBDA LIGHT CHAINS
Kappa free light chain: 154.9 mg/L — ABNORMAL HIGH (ref 3.3–19.4)
Kappa, lambda light chain ratio: 0.92 (ref 0.26–1.65)
Lambda free light chains: 167.5 mg/L — ABNORMAL HIGH (ref 5.7–26.3)

## 2020-03-27 NOTE — Progress Notes (Signed)
Location:  [facility] Room Number: 148/D Place of Service:  SNF (31)   CODE STATUS: Full Code  Allergies  Allergen Reactions  . Penicillins Other (See Comments)    Unsure of reaction Has patient had a PCN reaction causing immediate rash, facial/tongue/throat swelling, SOB or lightheadedness with hypotension: Unknown Has patient had a PCN reaction causing severe rash involving mucus membranes or skin necrosis: Unknown Has patient had a PCN reaction that required hospitalization: No Has patient had a PCN reaction occurring within the last 10 years: Unknown If all of the above answers are "NO", then may proceed with Cephalosporin use.    . Ace Inhibitors Cough and Other (See Comments)    Tongue swell  . Lisinopril Other (See Comments)    Tongue swell    Chief Complaint  Patient presents with  . Medical Management of Chronic Issues         Aortic atherosclerosis/atherosclerotic peripheral vascular disease: Hyperkalemia:  Chronic kidney disease with end stage renal disease on dialysis due to type 2 diabetes mellitus/dependence on dialysis:     HPI:  She is a 85 year old long term resident of this facility being seen for the management of her chronic illnesses: Aortic atherosclerosis/atherosclerotic peripheral vascular disease: Hyperkalemia:  Chronic kidney disease with end stage renal disease on dialysis due to type 2 diabetes mellitus/dependence on dialysis. She denies any uncontrolled pain. She denies any excessive hunger or thirst; no reports of increasing fatigue.   Past Medical History:  Diagnosis Date  . Anemia   . Anxiety   . Chronic kidney disease   . Chronic renal disease, stage 4, severely decreased glomerular filtration rate (GFR) between 15-29 mL/min/1.73 square meter (HCC) 08/22/2015  . Complication of anesthesia    delirious after Breast Surgery  . Dementia ([place])    mild  . Depression   . Diabetes mellitus without complication ([place])     type II  . Dyspnea    with activity  . GERD (gastroesophageal reflux disease)   . Glaucoma   . Hypertension   . Pneumonia   . Stage 1 infiltrating ductal carcinoma of right female breast ([place]) 08/21/2015   ER+ PR+ HER 2 neu + (3+) T1cN0     Past Surgical History:  Procedure Laterality Date  . AV FISTULA PLACEMENT Left 11/22/2017   Procedure: ARTERIOVENOUS (AV) FISTULA CREATION LEFT ARM;  Surgeon: Elam Dutch, MD;  Location: [facility];  Service: Vascular;  Laterality: Left;  . BIOPSY  08/07/2016   Procedure: BIOPSY;  Surgeon: Daneil Dolin, MD;  [facility];  Service: Endoscopy;;  gastric ulcer biopsy  . COLONOSCOPY    . ESOPHAGOGASTRODUODENOSCOPY N/A 08/07/2016   Procedure: ESOPHAGOGASTRODUODENOSCOPY (EGD);  Surgeon: Daneil Dolin, MD;  [facility];  Service: Endoscopy;  Laterality: N/A;  1215-rescheduled to 6/1 @ 2:30pm per Tretha Sciara  . ESOPHAGOGASTRODUODENOSCOPY N/A 11/27/2016   Procedure: ESOPHAGOGASTRODUODENOSCOPY (EGD);  Surgeon: Daneil Dolin, MD;  [facility];  Service: Endoscopy;  Laterality: N/A;  8:15am  . FISTULA SUPERFICIALIZATION Left 02/14/2018   Procedure: FISTULA SUPERFICIALIZATION LEFT ARM;  Surgeon: Angelia Mould, MD;  Location: [facility];  Service: Vascular;  Laterality: Left;  . FRACTURE SURGERY Right    ankle  . MALONEY DILATION N/A 08/07/2016   Procedure: Venia Minks DILATION;  Surgeon: Daneil Dolin, MD;  [facility];  Service: Endoscopy;  Laterality: N/A;  . MASTECTOMY, PARTIAL Right   . PERIPHERAL VASCULAR BALLOON ANGIOPLASTY  Left 07/13/2019   Procedure: PERIPHERAL VASCULAR BALLOON ANGIOPLASTY;  Surgeon: Marty Heck, MD;  Location: [facility];  Service: Cardiovascular;  Laterality: Left;  arm fistulogram  . PORT-A-CATH REMOVAL Left 11/22/2017   Procedure: REMOVAL PORT-A-CATH LEFT CHEST;  Surgeon: Elam Dutch, MD;  Location: [facility];  Service: Vascular;  Laterality: Left;  . RETINAL DETACHMENT  SURGERY Right     Social History   Socioeconomic History  . Marital status: Single    Spouse name: Not on file  . Number of children: Not on file  . Years of education: Not on file  . Highest education level: Not on file  Occupational History  . Occupation: retired   Tobacco Use  . Smoking status: Never Smoker  . Smokeless tobacco: Never Used  Vaping Use  . Vaping Use: Never used  Substance and Sexual Activity  . Alcohol use: No    Alcohol/week: 0.0 standard drinks  . Drug use: No  . Sexual activity: Never  Other Topics Concern  . Not on file  Social History Narrative   Long term resident of SNF    Social Determinants of Health   Financial Resource Strain: Low Risk   . Difficulty of Paying Living Expenses: Not very hard  Food Insecurity: No Food Insecurity  . Worried About Charity fundraiser in the Last Year: Never true  . Ran Out of Food in the Last Year: Never true  Transportation Needs: No Transportation Needs  . Lack of Transportation (Medical): No  . Lack of Transportation (Non-Medical): No  Physical Activity: Inactive  . Days of Exercise per Week: 0 days  . Minutes of Exercise per Session: 0 min  Stress: No Stress Concern Present  . Feeling of Stress : Not at all  Social Connections: Moderately Isolated  . Frequency of Communication with Friends and Family: More than three times a week  . Frequency of Social Gatherings with Friends and Family: Once a week  . Attends Religious Services: More than 4 times per year  . Active Member of Clubs or Organizations: No  . Attends Archivist Meetings: Never  . Marital Status: Never married  Intimate Partner Violence: Not At Risk  . Fear of Current or Ex-Partner: No  . Emotionally Abused: No  . Physically Abused: No  . Sexually Abused: No   Family History  Problem Relation Age of Onset  . Multiple myeloma Sister   . Brain cancer Sister   . Dementia Mother        died at 38  . Stroke Mother   . Heart  failure Mother   . Diabetes Mother   . Heart disease Father   . Prostate cancer Brother   . Colon cancer Neg Hx       VITAL SIGNS BP (!) 141/78   Pulse 100   Temp 98.1 F (36.7 C)   Resp 20   Ht 5' 3" (1.6 m)   Wt 141 lb 9.6 oz (64.2 kg)   BMI 25.08 kg/m   Outpatient Encounter Medications as of 03/27/2020  Medication Sig  . acetaminophen (TYLENOL) 650 MG CR tablet Take 650 mg by mouth every 6 (six) hours.  Marland Kitchen acyclovir (ZOVIRAX) 200 MG capsule Take 200 mg by mouth daily.  Marland Kitchen amLODipine (NORVASC) 10 MG tablet Take 10 mg by mouth daily. Not taking on dialysis days  . anastrozole (ARIMIDEX) 1 MG tablet TAKE 1 TABLET BY MOUTH DAILY  . aspirin EC 81 MG tablet  Take 81 mg by mouth daily.  . calcium acetate, Phos Binder, (PHOSLYRA) 667 MG/5ML SOLN Take 667 mg by mouth daily. With snacks at Daily  . calcium acetate, Phos Binder, (PHOSLYRA) 667 MG/5ML SOLN Take 1,334 mg by mouth 2 (two) times daily with a meal. 7:30 am and 5:30 pm  . calcium-vitamin D (OSCAL WITH D) 500-200 MG-UNIT tablet Take 1 tablet by mouth daily. At 8 am  . cloNIDine (CATAPRES) 0.3 MG tablet Take 0.3 mg by mouth 2 (two) times daily.   . insulin aspart (NOVOLOG FLEXPEN) 100 UNIT/ML FlexPen Inject 10 Units into the skin in the morning and at bedtime. 8 am and 5 pm  . insulin aspart (NOVOLOG FLEXPEN) 100 UNIT/ML FlexPen Inject 15 Units into the skin daily. At 12 pm, hold for accu-check less than 150  . insulin glargine (LANTUS SOLOSTAR) 100 UNIT/ML Solostar Pen Inject 10 Units into the skin at bedtime.  . melatonin 3 MG TABS tablet Take 6 mg by mouth at bedtime. For Sleep  . Multiple Vitamin (MULTIVITAMIN) capsule Take 1 capsule by mouth daily.  . NON FORMULARY Diet: _____ Regular, ___x___ NAS, _______Consistent Carbohydrate, _______NPO __x___Other Low Potassium  . NON FORMULARY Wanderguard #3310 to ankle for safety awareness. Check placement and function qshift. Every Shift Day, Evening, Night  . Nutritional  Supplements (NEPRO PO) Take 1 Can by mouth daily.  . [DISCONTINUED] SPS 15 GM/60ML suspension    No facility-administered encounter medications on file as of 03/27/2020.     SIGNIFICANT DIAGNOSTIC EXAMS   LABS REVIEWED PREVIOUS  10-13-19: hgb a1c 6.0; vit B 12: 1126 folate 20.9 10-17-19: wbc 8.8; hgb 8.3; hct 26.1; mcv 114.0 plt 270; glucose 190; bun 28; creat 4.85; k+ 3.9; na++ 127; ca 8.9 liver normal albumin 4.0  10-31-19: wbc 6.0; hgb 8.8; hct 28.6; mcv 117.2 plt 326; glucose 188; bun 45; creat 6.28; k+ 3.9; na++ 137;ca 9.1 liver normal albumin 3.9 11-07-19: wbc 6.1; hgb 9.2; hct 29.4 mcv 115.7 plt 276; glucose 201; bun 44; creat 5.93; k+ 4.5; na++ 136; ca 9.1 liver normal albumin 3.8 11-14-19; wbc 8.3; hgb 9.7; hct 31.2 mcv 116.4 plt 261; glucose 198; bun 44; creat 6.66; k+ 5.1; na++ 138; ca 8.8 liver normal albumin 3.9   12-12-19: wbc 7.6; hgb 11.3; hct 35.6; mcv 109.9 plt 212; glucose 602; bun 67; creat 8.62; k+ 5.6; na++ 135; ca 7.9 liver normal albumin 3.5  12-20-19: hgb a1c 8.0  01-09-20: wbc 6.9; hgb 12.1; hct 39.7; mcv 105.6 plt 299; glucose 225; bun 112; creat 14.53; k+ 6.4; na++ 132; ca 8.3 liver normal albumin 3.5  01-23-20: k+ 6.6  01-23-20: wbc 7.9; hgb 11.6; hct 36.9; mcv 103.7 plt 359; glucose 139; bun 109; creat 15.46; k+ 6.1; na++ 140; ca 8.7 liver normal albumin 4.1;  01-24-20: k+ 4.4  01-30-20: wbc 10.6; hgb 9.7; hct 32.0; mcv 103.6 plt 390; glucose 164; bun 44; creat 9.08; k+ 4.1; na++ 139; ca 9.0 liver normal albumin 3.3 02-06-20: wbc 7.8; hgb 9.9; hct 32.7 mcv 104.8 plt 344; glucose 211; bun 90; creat 10.97; k+ 6.0 na++ 137; ca 8.8 liver normal albumin 3.4   TODAY  03-26-20: wbc 6.3; hgb 9.4; hct 30.4; mcv 104.1; plt 300; glucose 223; bun 101; creat 14.22; k+ 5.8; na++ 135; ca 8.1 GFR 2; liver normal albumin 3.4    Review of Systems  Constitutional: Negative for malaise/fatigue.  Respiratory: Negative for cough and shortness of breath.   Cardiovascular: Negative for  chest pain, palpitations  and leg swelling.  Gastrointestinal: Negative for abdominal pain, constipation and heartburn.  Musculoskeletal: Negative for back pain, joint pain and myalgias.  Skin: Negative.   Neurological: Negative for dizziness.  Psychiatric/Behavioral: The patient is not nervous/anxious.    Physical Exam Constitutional:      General: She is not in acute distress.    Appearance: She is well-developed and well-nourished. She is not diaphoretic.  Neck:     Thyroid: No thyromegaly.  Cardiovascular:     Rate and Rhythm: Normal rate and regular rhythm.     Pulses: Normal pulses and intact distal pulses.     Heart sounds: Normal heart sounds.  Pulmonary:     Effort: Pulmonary effort is normal. No respiratory distress.     Breath sounds: Normal breath sounds.  Abdominal:     General: Bowel sounds are normal. There is no distension.     Palpations: Abdomen is soft.     Tenderness: There is no abdominal tenderness.  Musculoskeletal:        General: No edema. Normal range of motion.     Cervical back: Neck supple.     Right lower leg: No edema.     Left lower leg: No edema.  Lymphadenopathy:     Cervical: No cervical adenopathy.  Skin:    General: Skin is warm and dry.  Neurological:     Mental Status: She is alert. Mental status is at baseline.  Psychiatric:        Mood and Affect: Mood and affect and mood normal.        ASSESSMENT/ PLAN:  TODAY  1. Aortic atherosclerosis/atherosclerotic peripheral vascular disease: is stable (10-04-18 scan) will monitor   2. Hyperkalemia: k+ 5.8 is without change will continue low potassium diet  3. Chronic kidney disease with end stage renal disease on dialysis due to type 2 diabetes mellitus/dependence on dialysis: will continue dialysis three days weekly; will continue phoshyra 667 mg 2 tabs twice daily and 1 with snacks.    PREVIOUS   4. Hypertension; due to end stage renal disease due to type 2 diabetes mellitus: is  stable b/p 155/83 will continue norvasc 10 mg four times weekly and clonidine 0.3 mg twice daily asa 81 mg daily   5. Stage 1 infiltrating ductal carcinoma of right female breast: is stable is followed by oncology; will continue armidex 1 mg daily   6. Multiple myeloma no achieving remission: is without change is followed by oncology  7. Anemia due to end stage renal disease: is stable hgb 9.4 will monitor   8. Herpes: no reports of outbreaks; will continue acyclovir 200 mg daily   9. Vascular dementia with behavioral disturbance: is without change weight is 140 pounds will monitor   10. Diabetes with end stage renal disease (ESRD) is stable hgb a1c 8.0 will continue novolog 10 units with breakfast and supper; 15 units at lunch basaglar 10 units nightly         MD is aware of resident's narcotic use and is in agreement with current plan of care. We will attempt to wean resident as appropriate.  Ok Edwards NP [facility]  Contact [phone] Monday through Friday 8am- 5pm  After hours call [phone]

## 2020-03-29 DIAGNOSIS — N186 End stage renal disease: Secondary | ICD-10-CM | POA: Diagnosis not present

## 2020-03-29 DIAGNOSIS — Z23 Encounter for immunization: Secondary | ICD-10-CM | POA: Diagnosis not present

## 2020-03-29 DIAGNOSIS — Z1159 Encounter for screening for other viral diseases: Secondary | ICD-10-CM | POA: Diagnosis not present

## 2020-03-29 DIAGNOSIS — Z992 Dependence on renal dialysis: Secondary | ICD-10-CM | POA: Diagnosis not present

## 2020-03-29 DIAGNOSIS — C9000 Multiple myeloma not having achieved remission: Secondary | ICD-10-CM | POA: Diagnosis not present

## 2020-04-01 ENCOUNTER — Ambulatory Visit (INDEPENDENT_AMBULATORY_CARE_PROVIDER_SITE_OTHER): Payer: Medicare PPO | Admitting: Vascular Surgery

## 2020-04-01 ENCOUNTER — Encounter: Payer: Self-pay | Admitting: Vascular Surgery

## 2020-04-01 ENCOUNTER — Other Ambulatory Visit: Payer: Self-pay

## 2020-04-01 VITALS — BP 121/59 | HR 62 | Temp 98.2°F | Resp 14 | Ht 63.5 in | Wt 140.0 lb

## 2020-04-01 DIAGNOSIS — Z23 Encounter for immunization: Secondary | ICD-10-CM | POA: Diagnosis not present

## 2020-04-01 DIAGNOSIS — C9000 Multiple myeloma not having achieved remission: Secondary | ICD-10-CM | POA: Diagnosis not present

## 2020-04-01 DIAGNOSIS — N186 End stage renal disease: Secondary | ICD-10-CM | POA: Diagnosis not present

## 2020-04-01 DIAGNOSIS — Z992 Dependence on renal dialysis: Secondary | ICD-10-CM | POA: Diagnosis not present

## 2020-04-01 DIAGNOSIS — Z1159 Encounter for screening for other viral diseases: Secondary | ICD-10-CM | POA: Diagnosis not present

## 2020-04-01 DIAGNOSIS — I70209 Unspecified atherosclerosis of native arteries of extremities, unspecified extremity: Secondary | ICD-10-CM | POA: Insufficient documentation

## 2020-04-01 DIAGNOSIS — I700 Atherosclerosis of aorta: Secondary | ICD-10-CM | POA: Insufficient documentation

## 2020-04-01 NOTE — Progress Notes (Signed)
[facility] [place]  Patient name: Carly Wood MRN: [id number] DOB: January 08, 1936 Sex: female  REASON FOR VISIT: Discuss access for hemodialysis  HPI: Carly Wood is a 85 y.o. female here today for discussion of access for hemodialysis.  She is currently residing in the [facility].  She has no one with her today.  She is a very poor historian.  She currently is being dialyzed via a tunneled catheter placed in November 2021.  She had a prior left upper arm brachiocephalic fistula which had failed after attempted salvage.  She is seen today for discussion of new access.  She has never had right arm access.  She is right handed.  Past Medical History:  Diagnosis Date  . Anemia   . Anxiety   . Chronic kidney disease   . Chronic renal disease, stage 4, severely decreased glomerular filtration rate (GFR) between 15-29 mL/min/1.73 square meter (HCC) 08/22/2015  . Complication of anesthesia    delirious after Breast Surgery  . Dementia ([place])    mild  . Depression   . Diabetes mellitus without complication ([place])    type II  . Dyspnea    with activity  . GERD (gastroesophageal reflux disease)   . Glaucoma   . Hypertension   . Pneumonia   . Stage 1 infiltrating ductal carcinoma of right female breast ([place]) 08/21/2015   ER+ PR+ HER 2 neu + (3+) T1cN0     Family History  Problem Relation Age of Onset  . Multiple myeloma Sister   . Brain cancer Sister   . Dementia Mother        died at 64  . Stroke Mother   . Heart failure Mother   . Diabetes Mother   . Heart disease Father   . Prostate cancer Brother   . Colon cancer Neg Hx     SOCIAL HISTORY: Social History   Tobacco Use  . Smoking status: Never Smoker  . Smokeless tobacco: Never Used  Substance Use Topics  . Alcohol use: No    Alcohol/week: 0.0 standard drinks    Allergies  Allergen Reactions  . Penicillins Other (See Comments)    Unsure of reaction Has  patient had a PCN reaction causing immediate rash, facial/tongue/throat swelling, SOB or lightheadedness with hypotension: Unknown Has patient had a PCN reaction causing severe rash involving mucus membranes or skin necrosis: Unknown Has patient had a PCN reaction that required hospitalization: No Has patient had a PCN reaction occurring within the last 10 years: Unknown If all of the above answers are "NO", then may proceed with Cephalosporin use.    . Ace Inhibitors Cough and Other (See Comments)    Tongue swell  . Lisinopril Other (See Comments)    Tongue swell    Current Outpatient Medications  Medication Sig Dispense Refill  . anastrozole (ARIMIDEX) 1 MG tablet TAKE 1 TABLET BY MOUTH DAILY 30 tablet 6   No current facility-administered medications for this visit.    REVIEW OF SYSTEMS:  '[X]'  denotes positive finding, '[ ]'  denotes negative finding Cardiac  Comments:  Chest pain or chest pressure:    Shortness of breath upon exertion:    Short of breath when lying flat:    Irregular heart rhythm:        Vascular    Pain in calf, thigh, or hip brought on by ambulation:    Pain in feet at night that wakes you up from your sleep:  Blood clot in your veins:    Leg swelling:           PHYSICAL EXAM: Vitals:   04/01/20 0847  BP: (!) 121/59  Pulse: 62  Resp: 14  Temp: 98.2 F (36.8 C)  TempSrc: Other (Comment)  SpO2: 93%  Weight: 140 lb (63.5 kg)  Height: 5' 3.5" (1.613 m)    GENERAL: The patient is a well-nourished female, in no acute distress. The vital signs are documented above. CARDIOVASCULAR: Palpable radial pulses bilaterally.  Small surface veins bilaterally.  Thrombosed left upper arm AV fistula PULMONARY: There is good air exchange  MUSCULOSKELETAL: There are no major deformities or cyanosis. NEUROLOGIC: No focal weakness or paresthesias are detected. SKIN: There are no ulcers or rashes noted. PSYCHIATRIC: The patient has a normal affect.  DATA:  No  formal duplex.  I imaged her right arm veins with SonoSite and these are not adequate for fistula creation  MEDICAL ISSUES: End-stage renal disease.  Recommend right arm AV graft.  Will reimage her veins at the time of surgery and place fistula but feel that this is unlikely.  I discussed this with the patient and will coordinate this with the [name].    Rosetta Posner, MD FACS [facility] Tel [phone]

## 2020-04-01 NOTE — H&P (View-Only) (Signed)
[facility] [place]  Patient name: Carly Wood MRN: [id number] DOB: 11/27/1935 Sex: female  REASON FOR VISIT: Discuss access for hemodialysis  HPI: Carly Wood is a 85 y.o. female here today for discussion of access for hemodialysis.  She is currently residing in the [facility].  She has no one with her today.  She is a very poor historian.  She currently is being dialyzed via a tunneled catheter placed in November 2021.  She had a prior left upper arm brachiocephalic fistula which had failed after attempted salvage.  She is seen today for discussion of new access.  She has never had right arm access.  She is right handed.  Past Medical History:  Diagnosis Date  . Anemia   . Anxiety   . Chronic kidney disease   . Chronic renal disease, stage 4, severely decreased glomerular filtration rate (GFR) between 15-29 mL/min/1.73 square meter (HCC) 08/22/2015  . Complication of anesthesia    delirious after Breast Surgery  . Dementia ([place])    mild  . Depression   . Diabetes mellitus without complication ([place])    type II  . Dyspnea    with activity  . GERD (gastroesophageal reflux disease)   . Glaucoma   . Hypertension   . Pneumonia   . Stage 1 infiltrating ductal carcinoma of right female breast ([place]) 08/21/2015   ER+ PR+ HER 2 neu + (3+) T1cN0     Family History  Problem Relation Age of Onset  . Multiple myeloma Sister   . Brain cancer Sister   . Dementia Mother        died at 22  . Stroke Mother   . Heart failure Mother   . Diabetes Mother   . Heart disease Father   . Prostate cancer Brother   . Colon cancer Neg Hx     SOCIAL HISTORY: Social History   Tobacco Use  . Smoking status: Never Smoker  . Smokeless tobacco: Never Used  Substance Use Topics  . Alcohol use: No    Alcohol/week: 0.0 standard drinks    Allergies  Allergen Reactions  . Penicillins Other (See Comments)    Unsure of reaction Has  patient had a PCN reaction causing immediate rash, facial/tongue/throat swelling, SOB or lightheadedness with hypotension: Unknown Has patient had a PCN reaction causing severe rash involving mucus membranes or skin necrosis: Unknown Has patient had a PCN reaction that required hospitalization: No Has patient had a PCN reaction occurring within the last 10 years: Unknown If all of the above answers are "NO", then may proceed with Cephalosporin use.    . Ace Inhibitors Cough and Other (See Comments)    Tongue swell  . Lisinopril Other (See Comments)    Tongue swell    Current Outpatient Medications  Medication Sig Dispense Refill  . anastrozole (ARIMIDEX) 1 MG tablet TAKE 1 TABLET BY MOUTH DAILY 30 tablet 6   No current facility-administered medications for this visit.    REVIEW OF SYSTEMS:  '[X]'  denotes positive finding, '[ ]'  denotes negative finding Cardiac  Comments:  Chest pain or chest pressure:    Shortness of breath upon exertion:    Short of breath when lying flat:    Irregular heart rhythm:        Vascular    Pain in calf, thigh, or hip brought on by ambulation:    Pain in feet at night that wakes you up from your sleep:  Blood clot in your veins:    Leg swelling:           PHYSICAL EXAM: Vitals:   04/01/20 0847  BP: (!) 121/59  Pulse: 62  Resp: 14  Temp: 98.2 F (36.8 C)  TempSrc: Other (Comment)  SpO2: 93%  Weight: 140 lb (63.5 kg)  Height: 5' 3.5" (1.613 m)    GENERAL: The patient is a well-nourished female, in no acute distress. The vital signs are documented above. CARDIOVASCULAR: Palpable radial pulses bilaterally.  Small surface veins bilaterally.  Thrombosed left upper arm AV fistula PULMONARY: There is good air exchange  MUSCULOSKELETAL: There are no major deformities or cyanosis. NEUROLOGIC: No focal weakness or paresthesias are detected. SKIN: There are no ulcers or rashes noted. PSYCHIATRIC: The patient has a normal affect.  DATA:  No  formal duplex.  I imaged her right arm veins with SonoSite and these are not adequate for fistula creation  MEDICAL ISSUES: End-stage renal disease.  Recommend right arm AV graft.  Will reimage her veins at the time of surgery and place fistula but feel that this is unlikely.  I discussed this with the patient and will coordinate this with the [facility].    Rosetta Posner, MD FACS [facility] Tel [phone]

## 2020-04-02 ENCOUNTER — Other Ambulatory Visit: Payer: Self-pay

## 2020-04-02 ENCOUNTER — Encounter (HOSPITAL_COMMUNITY): Payer: Self-pay

## 2020-04-02 ENCOUNTER — Other Ambulatory Visit (HOSPITAL_COMMUNITY): Payer: Medicare PPO

## 2020-04-02 ENCOUNTER — Inpatient Hospital Stay (HOSPITAL_COMMUNITY): Payer: Medicare PPO

## 2020-04-02 ENCOUNTER — Encounter (HOSPITAL_COMMUNITY)
Admit: 2020-04-02 | Discharge: 2020-04-02 | Disposition: A | Discharge status: Home / Self Care | Payer: Medicare PPO | Attending: Vascular Surgery | Admitting: Vascular Surgery

## 2020-04-02 ENCOUNTER — Ambulatory Visit (HOSPITAL_COMMUNITY): Payer: Medicare PPO

## 2020-04-02 ENCOUNTER — Inpatient Hospital Stay (HOSPITAL_BASED_OUTPATIENT_CLINIC_OR_DEPARTMENT_OTHER): Payer: Medicare PPO | Admitting: Hematology

## 2020-04-02 ENCOUNTER — Ambulatory Visit (HOSPITAL_COMMUNITY): Payer: Medicare PPO | Admitting: Hematology

## 2020-04-02 VITALS — BP 105/47 | HR 78 | Temp 96.8°F | Resp 18 | Wt 138.4 lb

## 2020-04-02 DIAGNOSIS — C50911 Malignant neoplasm of unspecified site of right female breast: Secondary | ICD-10-CM | POA: Diagnosis not present

## 2020-04-02 DIAGNOSIS — M8580 Other specified disorders of bone density and structure, unspecified site: Secondary | ICD-10-CM | POA: Diagnosis not present

## 2020-04-02 DIAGNOSIS — C9000 Multiple myeloma not having achieved remission: Secondary | ICD-10-CM

## 2020-04-02 DIAGNOSIS — Z9221 Personal history of antineoplastic chemotherapy: Secondary | ICD-10-CM | POA: Diagnosis not present

## 2020-04-02 DIAGNOSIS — C9001 Multiple myeloma in remission: Secondary | ICD-10-CM | POA: Diagnosis not present

## 2020-04-02 DIAGNOSIS — Z5111 Encounter for antineoplastic chemotherapy: Secondary | ICD-10-CM | POA: Diagnosis not present

## 2020-04-02 DIAGNOSIS — N186 End stage renal disease: Secondary | ICD-10-CM | POA: Diagnosis not present

## 2020-04-02 DIAGNOSIS — Z79811 Long term (current) use of aromatase inhibitors: Secondary | ICD-10-CM | POA: Diagnosis not present

## 2020-04-02 DIAGNOSIS — Z992 Dependence on renal dialysis: Secondary | ICD-10-CM | POA: Diagnosis not present

## 2020-04-02 DIAGNOSIS — Z9011 Acquired absence of right breast and nipple: Secondary | ICD-10-CM | POA: Diagnosis not present

## 2020-04-02 LAB — COMPREHENSIVE METABOLIC PANEL
ALT: 10 U/L (ref 0–44)
AST: 16 U/L (ref 15–41)
Albumin: 3.4 g/dL — ABNORMAL LOW (ref 3.5–5.0)
Alkaline Phosphatase: 74 U/L (ref 38–126)
Anion gap: 16 — ABNORMAL HIGH (ref 5–15)
BUN: 59 mg/dL — ABNORMAL HIGH (ref 8–23)
CO2: 27 mmol/L (ref 22–32)
Calcium: 8.2 mg/dL — ABNORMAL LOW (ref 8.9–10.3)
Chloride: 95 mmol/L — ABNORMAL LOW (ref 98–111)
Creatinine, Ser: 9.38 mg/dL — ABNORMAL HIGH (ref 0.44–1.00)
GFR, Estimated: 4 mL/min — ABNORMAL LOW (ref >60)
Glucose, Bld: 179 mg/dL — ABNORMAL HIGH (ref 70–99)
Potassium: 4.3 mmol/L (ref 3.5–5.1)
Sodium: 138 mmol/L (ref 135–145)
Total Bilirubin: 0.2 mg/dL — ABNORMAL LOW (ref 0.3–1.2)
Total Protein: 6.6 g/dL (ref 6.5–8.1)

## 2020-04-02 LAB — CBC WITH DIFFERENTIAL/PLATELET
Abs Immature Granulocytes: 0.03 10*3/uL (ref 0.00–0.07)
Basophils Absolute: 0 10*3/uL (ref 0.0–0.1)
Basophils Relative: 1 %
Eosinophils Absolute: 0.3 10*3/uL (ref 0.0–0.5)
Eosinophils Relative: 4 %
HCT: 33.7 % — ABNORMAL LOW (ref 36.0–46.0)
Hemoglobin: 10.4 g/dL — ABNORMAL LOW (ref 12.0–15.0)
Immature Granulocytes: 0 %
Lymphocytes Relative: 19 %
Lymphs Abs: 1.5 10*3/uL (ref 0.7–4.0)
MCH: 32.9 pg (ref 26.0–34.0)
MCHC: 30.9 g/dL (ref 30.0–36.0)
MCV: 106.6 fL — ABNORMAL HIGH (ref 80.0–100.0)
Monocytes Absolute: 0.8 10*3/uL (ref 0.1–1.0)
Monocytes Relative: 10 %
Neutro Abs: 5.3 10*3/uL (ref 1.7–7.7)
Neutrophils Relative %: 66 %
Platelets: 309 10*3/uL (ref 150–400)
RBC: 3.16 MIL/uL — ABNORMAL LOW (ref 3.87–5.11)
RDW: 16.7 % — ABNORMAL HIGH (ref 11.5–15.5)
WBC: 8.1 10*3/uL (ref 4.0–10.5)
nRBC: 0 % (ref 0.0–0.2)

## 2020-04-02 LAB — LACTATE DEHYDROGENASE: LDH: 140 U/L (ref 98–192)

## 2020-04-02 MED ORDER — DEXAMETHASONE 4 MG PO TABS
10.0000 mg | ORAL_TABLET | Freq: Once | ORAL | Status: AC
  Administered 2020-04-02: 10 mg via ORAL

## 2020-04-02 MED ORDER — DEXAMETHASONE 4 MG PO TABS
ORAL_TABLET | ORAL | Status: AC
  Filled 2020-04-02: qty 3

## 2020-04-02 MED ORDER — PROCHLORPERAZINE MALEATE 10 MG PO TABS
10.0000 mg | ORAL_TABLET | Freq: Once | ORAL | Status: AC
  Administered 2020-04-02: 10 mg via ORAL

## 2020-04-02 MED ORDER — BORTEZOMIB CHEMO SQ INJECTION 3.5 MG (2.5MG/ML)
1.3000 mg/m2 | Freq: Once | INTRAMUSCULAR | Status: AC
  Administered 2020-04-02: 2.25 mg via SUBCUTANEOUS
  Filled 2020-04-02: qty 0.9

## 2020-04-02 MED ORDER — PROCHLORPERAZINE MALEATE 10 MG PO TABS
ORAL_TABLET | ORAL | Status: AC
  Filled 2020-04-02: qty 1

## 2020-04-02 NOTE — Progress Notes (Signed)
[place], [place] [address]   CLINIC:  Medical Oncology/Hematology  PCP:  Gerlene Fee, NP [address] [place] [address] [phone]   REASON FOR VISIT:  Follow-up for plasma cell myeloma and anemia  PRIOR THERAPY: None  NGS Results: Not done  CURRENT THERAPY: Velcade & Decadron 3 weeks on, 1 week off  BRIEF ONCOLOGIC HISTORY:  Oncology History  Stage 1 infiltrating ductal carcinoma of right female breast ([place])  09/12/2014 Mammogram   Mass in upper outer R breast, middle third depth appears slightly larger than it was on prior exam with more irreg spiculated margins   10/02/2014 Pathology Results   biopsy with invasive ductal carcinoma high grade 1.1 cm, dcis solid type. additional R breast tissue, excision, invasive ductal high grade 0.9 cm    10/24/2014 Pathology Results   no residual invasive carcinoma, DCIS, focal, atypical ductal hyperplasia, 0/7 LN positive for metastatic carcinoma ER > 90%, PR 30%, HER 2 2+   10/24/2014 Cancer Staging   T1cN0M0   10/24/2014 Surgery   R mastectomy, T1c, N0   12/28/2014 - 11/22/2015 Chemotherapy   Taxol/herceptin weekly X 12, Herceptin every 21 days, last due in September 2017   03/11/2015 -  Anti-estrogen oral therapy   Arimidex 1 mg daily   09/12/2015 Imaging   MUGA- The left ventricular ejection fraction equals 68%.   06/08/2016 Treatment Plan Change   Started Nerlynx   Multiple myeloma ([facility])  10/04/2018 Initial Diagnosis   Multiple myeloma ([place])   10/11/2018 -  Chemotherapy    Patient is on Treatment Plan: MYELOMA NON-TRANSPLANT CANDIDATES VRD WEEKLY D21D         CANCER STAGING: Cancer Staging Stage 1 infiltrating ductal carcinoma of right female breast [facility]) Staging form: Breast, AJCC 7th Edition - Clinical stage from 08/30/2015: Stage IA (T1c, N0, M0) - Signed by Baird Cancer, PA-C on 08/30/2015   INTERVAL HISTORY:  Ms. Danisa Kopec Carithers, a 85 y.o. female, returns  for routine follow-up and consideration for next cycle of chemotherapy. Darl was last seen on 01/09/2020.  Due for day #15 of cycle #20 of bortezomib today.   Overall, she tells me she has been feeling good. She reports that she fell on the floor coming out of dialysis and was checked out, though she continues having right foot pain and swelling. She continues having a cough which is productive occasionally. She reports having numbness in her fingers occasionally, especially at night.  Overall, she feels ready for next cycle of chemo today.    REVIEW OF SYSTEMS:  Review of Systems  Constitutional: Positive for appetite change (50%) and fatigue (50%).  Respiratory: Positive for cough.   Cardiovascular: Positive for leg swelling (R foot swelling).  Musculoskeletal: Positive for arthralgias (5/10 R foot pain).  Psychiatric/Behavioral: Positive for sleep disturbance.  All other systems reviewed and are negative.   PAST MEDICAL/SURGICAL HISTORY:  Past Medical History:  Diagnosis Date  . Anemia   . Anxiety   . Chronic kidney disease   . Chronic renal disease, stage 4, severely decreased glomerular filtration rate (GFR) between 15-29 mL/min/1.73 square meter (HCC) 08/22/2015  . Complication of anesthesia    delirious after Breast Surgery  . Dementia ([place])    mild  . Depression   . Diabetes mellitus without complication ([place])    type II  . Dyspnea    with activity  . GERD (gastroesophageal reflux disease)   . Glaucoma   .  Hypertension   . Pneumonia   . Stage 1 infiltrating ductal carcinoma of right female breast ([place]) 08/21/2015   ER+ PR+ HER 2 neu + (3+) T1cN0    Past Surgical History:  Procedure Laterality Date  . AV FISTULA PLACEMENT Left 11/22/2017   Procedure: ARTERIOVENOUS (AV) FISTULA CREATION LEFT ARM;  Surgeon: Elam Dutch, MD;  Location: [place];  Service: Vascular;  Laterality: Left;  . BIOPSY  08/07/2016   Procedure: BIOPSY;  Surgeon: Daneil Dolin, MD;  [facility];  Service: Endoscopy;;  gastric ulcer biopsy  . COLONOSCOPY    . ESOPHAGOGASTRODUODENOSCOPY N/A 08/07/2016   Procedure: ESOPHAGOGASTRODUODENOSCOPY (EGD);  Surgeon: Daneil Dolin, MD;  [facility];  Service: Endoscopy;  Laterality: N/A;  1215-rescheduled to 6/1 @ 2:30pm per Tretha Sciara  . ESOPHAGOGASTRODUODENOSCOPY N/A 11/27/2016   Procedure: ESOPHAGOGASTRODUODENOSCOPY (EGD);  Surgeon: Daneil Dolin, MD;  [facility];  Service: Endoscopy;  Laterality: N/A;  8:15am  . FISTULA SUPERFICIALIZATION Left 02/14/2018   Procedure: FISTULA SUPERFICIALIZATION LEFT ARM;  Surgeon: Angelia Mould, MD;  Location: [facility];  Service: Vascular;  Laterality: Left;  . FRACTURE SURGERY Right    ankle  . MALONEY DILATION N/A 08/07/2016   Procedure: Venia Minks DILATION;  Surgeon: Daneil Dolin, MD;  [facility];  Service: Endoscopy;  Laterality: N/A;  . MASTECTOMY, PARTIAL Right   . PERIPHERAL VASCULAR BALLOON ANGIOPLASTY Left 07/13/2019   Procedure: PERIPHERAL VASCULAR BALLOON ANGIOPLASTY;  Surgeon: Marty Heck, MD;  Location: [facility];  Service: Cardiovascular;  Laterality: Left;  arm fistulogram  . PORT-A-CATH REMOVAL Left 11/22/2017   Procedure: REMOVAL PORT-A-CATH LEFT CHEST;  Surgeon: Elam Dutch, MD;  Location: [facility] OR;  Service: Vascular;  Laterality: Left;  . RETINAL DETACHMENT SURGERY Right     SOCIAL HISTORY:  Social History   Socioeconomic History  . Marital status: Single    Spouse name: Not on file  . Number of children: Not on file  . Years of education: Not on file  . Highest education level: Not on file  Occupational History  . Occupation: retired   Tobacco Use  . Smoking status: Never Smoker  . Smokeless tobacco: Never Used  Vaping Use  . Vaping Use: Never used  Substance and Sexual Activity  . Alcohol use: No    Alcohol/week: 0.0 standard drinks  . Drug use: No  . Sexual activity: Never  Other Topics Concern  .  Not on file  Social History Narrative   Long term resident of SNF    Social Determinants of Health   Financial Resource Strain: Low Risk   . Difficulty of Paying Living Expenses: Not very hard  Food Insecurity: No Food Insecurity  . Worried About Charity fundraiser in the Last Year: Never true  . Ran Out of Food in the Last Year: Never true  Transportation Needs: No Transportation Needs  . Lack of Transportation (Medical): No  . Lack of Transportation (Non-Medical): No  Physical Activity: Inactive  . Days of Exercise per Week: 0 days  . Minutes of Exercise per Session: 0 min  Stress: No Stress Concern Present  . Feeling of Stress : Not at all  Social Connections: Moderately Isolated  . Frequency of Communication with Friends and Family: More than three times a week  . Frequency of Social Gatherings with Friends and Family: Once a week  . Attends Religious Services: More than 4 times per year  .  Active Member of Clubs or Organizations: No  . Attends Archivist Meetings: Never  . Marital Status: Never married  Intimate Partner Violence: Not At Risk  . Fear of Current or Ex-Partner: No  . Emotionally Abused: No  . Physically Abused: No  . Sexually Abused: No    FAMILY HISTORY:  Family History  Problem Relation Age of Onset  . Multiple myeloma Sister   . Brain cancer Sister   . Dementia Mother        died at 66  . Stroke Mother   . Heart failure Mother   . Diabetes Mother   . Heart disease Father   . Prostate cancer Brother   . Colon cancer Neg Hx     CURRENT MEDICATIONS:  No current facility-administered medications for this visit.   Current Outpatient Medications  Medication Sig Dispense Refill  . anastrozole (ARIMIDEX) 1 MG tablet TAKE 1 TABLET BY MOUTH DAILY (Patient taking differently: Take 1 mg by mouth daily.) 30 tablet 6   Facility-Administered Medications Ordered in Other Visits  Medication Dose Route Frequency Provider Last Rate Last Admin  .  bortezomib SQ (VELCADE) chemo injection (2.5mg /mL concentration) 2.25 mg  1.3 mg/m2 (Order-Specific) Subcutaneous Once Derek Jack, MD      . dexamethasone (DECADRON) tablet 10 mg  10 mg Oral Once Derek Jack, MD      . prochlorperazine (COMPAZINE) tablet 10 mg  10 mg Oral Once Derek Jack, MD        ALLERGIES:  Allergies  Allergen Reactions  . Penicillins Other (See Comments)    Unsure of reaction Has patient had a PCN reaction causing immediate rash, facial/tongue/throat swelling, SOB or lightheadedness with hypotension: Unknown Has patient had a PCN reaction causing severe rash involving mucus membranes or skin necrosis: Unknown Has patient had a PCN reaction that required hospitalization: No Has patient had a PCN reaction occurring within the last 10 years: Unknown If all of the above answers are "NO", then may proceed with Cephalosporin use.    . Ace Inhibitors Cough and Other (See Comments)    Tongue swell  . Lisinopril Other (See Comments)    Tongue swell    PHYSICAL EXAM:  Performance status (ECOG): 2 - Symptomatic, <50% confined to bed  Vitals:   04/02/20 1358  BP: (!) 105/47  Pulse: 78  Resp: 18  Temp: (!) 96.8 F (36 C)  SpO2: 99%   Wt Readings from Last 3 Encounters:  04/02/20 138 lb 6.4 oz (62.8 kg)  04/02/20 140 lb (63.5 kg)  04/01/20 140 lb (63.5 kg)   Physical Exam Vitals reviewed.  Constitutional:      Appearance: Normal appearance.     Comments: Wheelchair-bound  Cardiovascular:     Rate and Rhythm: Normal rate and regular rhythm.     Pulses: Normal pulses.     Heart sounds: Normal heart sounds.  Pulmonary:     Effort: Pulmonary effort is normal.     Breath sounds: Normal breath sounds.  Musculoskeletal:     Right lower leg: No edema.     Left lower leg: No edema.  Neurological:     General: No focal deficit present.     Mental Status: She is alert and oriented to person, place, and time.  Psychiatric:        Mood  and Affect: Mood normal.        Behavior: Behavior normal.     LABORATORY DATA:  I have reviewed the labs as  listed.  CBC Latest Ref Rng & Units 04/02/2020 03/26/2020 03/19/2020  WBC 4.0 - 10.5 K/uL 8.1 6.3 5.0  Hemoglobin 12.0 - 15.0 g/dL 10.4(L) 9.4(L) 10.0(L)  Hematocrit 36.0 - 46.0 % 33.7(L) 30.4(L) 32.3(L)  Platelets 150 - 400 K/uL 309 300 270   CMP Latest Ref Rng & Units 04/02/2020 03/26/2020 03/19/2020  Glucose 70 - 99 mg/dL 179(H) 223(H) 282(H)  BUN 8 - 23 mg/dL 59(H) 101(H) 73(H)  Creatinine 0.44 - 1.00 mg/dL 9.38(H) 14.22(H) 10.49(H)  Sodium 135 - 145 mmol/L 138 135 134(L)  Potassium 3.5 - 5.1 mmol/L 4.3 5.8(H) 5.8(H)  Chloride 98 - 111 mmol/L 95(L) 97(L) 95(L)  CO2 22 - 32 mmol/L 27 21(L) 22  Calcium 8.9 - 10.3 mg/dL 8.2(L) 8.1(L) 8.2(L)  Total Protein 6.5 - 8.1 g/dL 6.6 6.4(L) 6.7  Total Bilirubin 0.3 - 1.2 mg/dL 0.2(L) 0.4 0.5  Alkaline Phos 38 - 126 U/L 74 72 83  AST 15 - 41 U/L 16 13(L) 15  ALT 0 - 44 U/L _0 Lab Results  Component Value Date   LDH 140 04/02/2020   LDH 154 03/26/2020   LDH 146 01/02/2020   Lab Results  Component Value Date   TOTALPROTELP 5.9 (L) 03/26/2020   ALBUMINELP 3.4 03/26/2020   A1GS 0.3 03/26/2020   A2GS 0.8 03/26/2020   BETS 0.9 03/26/2020   GAMS 0.6 03/26/2020   MSPIKE Not Observed 03/26/2020   SPEI Comment 03/26/2020    Lab Results  Component Value Date   KPAFRELGTCHN 154.9 (H) 03/26/2020   LAMBDASER 167.5 (H) 03/26/2020   KAPLAMBRATIO 0.92 03/26/2020    DIAGNOSTIC IMAGING:  I have independently reviewed the scans and discussed with the patient. No results found.   ASSESSMENT:  1. IgA lambda plasma cell myeloma, stage II, standard risk: -BMBX on 09/20/2018 shows plasma cell myeloma, 30% plasma cells. FISH panel was normal. Chromosome analysis normal. -Labs at diagnosis on 08/29/2018 with M spike 0.9 g. Kappa light chains 148, lambda light chains 1132, ratio 0.13. LDH normal. Beta-2 microglobulin 13.5. She had  transfusion dependent anemia. -Velcade and dexamethasone started on 10/11/2018. -Myeloma panel on 07/11/2019 shows SPEP is negative. Free light chain ratio improved to 1.09. Lambda light chains are 128, improved from 141. Kappa light chains are 140. -She has been resident at [facility] since 10/12/2019.  2. Stage I IDC of the right breast, ER/PR positive, HER-2 negative: -She is on anastrozole.   PLAN:  1. IgA lambda plasma cell myeloma: -She is tolerating Velcade and dexamethasone very well. -She reported some numbness in the fingertips mostly at nighttime which is not bothering her.  If any worsening, will consider decreasing Velcade dose. -She reportedly fell while coming back from dialysis and has some right ankle pain. -Myeloma labs from 03/26/2020 shows M spike not observed.  Lambda light chains are 167 and improved from previous value.  Kappa light chains are 154 with ratio of 0.92. -We will continue same regimen at this time.  RTC 8 weeks with repeat myeloma labs.  2. ESRD on HD: -Continue HD on Monday, Wednesday and Friday.  3. Bone strengthening: -Bisphosphonates were not started due to dialysis.  4. Osteopenia: -Bone density in 2017 was osteopenic.  We will plan to repeat DEXA scan in the future.  5. Right breast cancer: -She will continue anastrozole.   Orders placed this encounter:  No orders of the defined types were placed in this encounter.    Derek Jack, MD [facility] [phone]   Saunders Revel, am acting as a scribe for Dr. Sanda Linger.  I, Derek Jack MD, have reviewed the above documentation for accuracy and completeness, and I agree with the above.

## 2020-04-02 NOTE — Progress Notes (Unsigned)
OK to treat with today's labs.  Dr Alphonzo Severance Aurora Mask, PharmD

## 2020-04-02 NOTE — Progress Notes (Signed)
Patient assessed and labs reviewed by Dr. Katragadda. Okay to proceed with treatment. Primary RN and pharmacy aware. 

## 2020-04-02 NOTE — Progress Notes (Unsigned)
Patient is here today for D15 velcade injection.  She was seen by Dr. Delton Coombes and he is okay for her to receive treatment today.

## 2020-04-02 NOTE — Patient Instructions (Signed)
[place] at [facility] Discharge Instructions  You were seen today by Dr. Delton Coombes. You received your velcade injection.  Please follow up as scheduled.    Thank you for choosing [place] at [facility] to provide your oncology and hematology care.  To afford each patient quality time with our provider, please arrive at least 15 minutes before your scheduled appointment time.   If you have a lab appointment with the [facility] please come in thru the Main Entrance and check in at the main information desk.  You need to re-schedule your appointment should you arrive 10 or more minutes late.  We strive to give you quality time with our providers, and arriving late affects you and other patients whose appointments are after yours.  Also, if you no show three or more times for appointments you may be dismissed from the clinic at the providers discretion.     Again, thank you for choosing [facility].  Our hope is that these requests will decrease the amount of time that you wait before being seen by our physicians.       _____________________________________________________________  Should you have questions after your visit to [facility], please contact our office at [phone] and follow the prompts.  Our office hours are 8:00 a.m. and 4:30 p.m. Monday - Friday.  Please note that voicemails left after 4:00 p.m. may not be returned until the following business day.  We are closed weekends and major holidays.  You do have access to a nurse 24-7, just call the main number to the clinic [phone] and do not press any options, hold on the line and a nurse will answer the phone.    For prescription refill requests, have your pharmacy contact our office and allow 72 hours.    Due to Covid, you will need to wear a mask upon entering the hospital. If you do not have a mask, a mask will be given to you at the Main  Entrance upon arrival. For doctor visits, patients may have 1 support person age 52 or older with them. For treatment visits, patients can not have anyone with them due to social distancing guidelines and our immunocompromised population.

## 2020-04-02 NOTE — Pre-Procedure Instructions (Signed)
All patient history and information was obtained by Estill Bamberg, RN @ [facility]. She states patient is alert and oriented and can sign her own consent. Gave her preop instructions over the phone and she verbalizes understanding of them.

## 2020-04-03 DIAGNOSIS — Z1159 Encounter for screening for other viral diseases: Secondary | ICD-10-CM | POA: Diagnosis not present

## 2020-04-03 DIAGNOSIS — Z992 Dependence on renal dialysis: Secondary | ICD-10-CM | POA: Diagnosis not present

## 2020-04-03 DIAGNOSIS — Z23 Encounter for immunization: Secondary | ICD-10-CM | POA: Diagnosis not present

## 2020-04-03 DIAGNOSIS — N186 End stage renal disease: Secondary | ICD-10-CM | POA: Diagnosis not present

## 2020-04-03 DIAGNOSIS — C9000 Multiple myeloma not having achieved remission: Secondary | ICD-10-CM | POA: Diagnosis not present

## 2020-04-03 MED ORDER — OCTREOTIDE ACETATE 30 MG IM KIT
PACK | INTRAMUSCULAR | Status: AC
  Filled 2020-04-03: qty 1

## 2020-04-04 ENCOUNTER — Ambulatory Visit (HOSPITAL_COMMUNITY): Payer: Medicare PPO | Admitting: Certified Registered"

## 2020-04-04 ENCOUNTER — Ambulatory Visit (HOSPITAL_COMMUNITY)
Admission: RE | Admit: 2020-04-04 | Discharge: 2020-04-04 | Disposition: A | Discharge status: Home / Self Care | Payer: Medicare PPO | Attending: Vascular Surgery | Admitting: Vascular Surgery

## 2020-04-04 ENCOUNTER — Encounter (HOSPITAL_COMMUNITY)
Admission: RE | Disposition: A | Discharge status: Home / Self Care | Payer: Self-pay | Source: Home / Self Care | Attending: Vascular Surgery

## 2020-04-04 ENCOUNTER — Inpatient Hospital Stay
Admission: RE | Admit: 2020-04-04 | Discharge: 2020-05-22 | Disposition: A | Discharge status: Nursing Home (Includes ICF) | Payer: Medicare PPO | Source: Ambulatory Visit | Attending: Internal Medicine | Admitting: Internal Medicine

## 2020-04-04 ENCOUNTER — Encounter (HOSPITAL_COMMUNITY): Payer: Self-pay | Admitting: Vascular Surgery

## 2020-04-04 DIAGNOSIS — Z992 Dependence on renal dialysis: Secondary | ICD-10-CM

## 2020-04-04 DIAGNOSIS — Z79899 Other long term (current) drug therapy: Secondary | ICD-10-CM | POA: Insufficient documentation

## 2020-04-04 DIAGNOSIS — I120 Hypertensive chronic kidney disease with stage 5 chronic kidney disease or end stage renal disease: Secondary | ICD-10-CM | POA: Diagnosis not present

## 2020-04-04 DIAGNOSIS — Z888 Allergy status to other drugs, medicaments and biological substances status: Secondary | ICD-10-CM | POA: Diagnosis not present

## 2020-04-04 DIAGNOSIS — E1122 Type 2 diabetes mellitus with diabetic chronic kidney disease: Secondary | ICD-10-CM | POA: Insufficient documentation

## 2020-04-04 DIAGNOSIS — Z20822 Contact with and (suspected) exposure to covid-19: Secondary | ICD-10-CM | POA: Insufficient documentation

## 2020-04-04 DIAGNOSIS — I10 Essential (primary) hypertension: Secondary | ICD-10-CM | POA: Diagnosis not present

## 2020-04-04 DIAGNOSIS — N186 End stage renal disease: Secondary | ICD-10-CM | POA: Insufficient documentation

## 2020-04-04 DIAGNOSIS — Z880 Allergy status to penicillin: Secondary | ICD-10-CM | POA: Diagnosis not present

## 2020-04-04 DIAGNOSIS — N185 Chronic kidney disease, stage 5: Secondary | ICD-10-CM | POA: Diagnosis not present

## 2020-04-04 HISTORY — PX: AV FISTULA PLACEMENT: SHX1204

## 2020-04-04 LAB — SARS CORONAVIRUS 2 BY RT PCR (HOSPITAL ORDER, PERFORMED IN ~~LOC~~ HOSPITAL LAB): SARS Coronavirus 2: NEGATIVE

## 2020-04-04 LAB — POCT I-STAT, CHEM 8
BUN: 53 mg/dL — ABNORMAL HIGH (ref 8–23)
Calcium, Ion: 0.94 mmol/L — ABNORMAL LOW (ref 1.15–1.40)
Chloride: 94 mmol/L — ABNORMAL LOW (ref 98–111)
Creatinine, Ser: 7.7 mg/dL — ABNORMAL HIGH (ref 0.44–1.00)
Glucose, Bld: 134 mg/dL — ABNORMAL HIGH (ref 70–99)
HCT: 35 % — ABNORMAL LOW (ref 36.0–46.0)
Hemoglobin: 11.9 g/dL — ABNORMAL LOW (ref 12.0–15.0)
Potassium: 5 mmol/L (ref 3.5–5.1)
Sodium: 134 mmol/L — ABNORMAL LOW (ref 135–145)
TCO2: 31 mmol/L (ref 22–32)

## 2020-04-04 LAB — GLUCOSE, CAPILLARY: Glucose-Capillary: 161 mg/dL — ABNORMAL HIGH (ref 70–99)

## 2020-04-04 SURGERY — INSERTION OF ARTERIOVENOUS (AV) GORE-TEX GRAFT ARM
Anesthesia: General | Site: Arm Lower | Laterality: Right

## 2020-04-04 MED ORDER — LIDOCAINE-EPINEPHRINE 0.5 %-1:200000 IJ SOLN
INTRAMUSCULAR | Status: AC
  Filled 2020-04-04: qty 1

## 2020-04-04 MED ORDER — PROPOFOL 10 MG/ML IV BOLUS
INTRAVENOUS | Status: DC | PRN
  Administered 2020-04-04: 50 mg via INTRAVENOUS
  Administered 2020-04-04: 110 mg via INTRAVENOUS

## 2020-04-04 MED ORDER — PHENYLEPHRINE HCL (PRESSORS) 10 MG/ML IV SOLN
INTRAVENOUS | Status: AC
  Filled 2020-04-04: qty 1

## 2020-04-04 MED ORDER — ONDANSETRON HCL 4 MG/2ML IJ SOLN: 4.0000 mg | Freq: Once | INTRAMUSCULAR | Status: DC | PRN

## 2020-04-04 MED ORDER — PHENYLEPHRINE 40 MCG/ML (10ML) SYRINGE FOR IV PUSH (FOR BLOOD PRESSURE SUPPORT)
PREFILLED_SYRINGE | INTRAVENOUS | Status: AC
  Filled 2020-04-04: qty 10

## 2020-04-04 MED ORDER — DEXAMETHASONE SODIUM PHOSPHATE 4 MG/ML IJ SOLN
INTRAMUSCULAR | Status: DC | PRN
  Administered 2020-04-04: 4 mg via INTRAVENOUS

## 2020-04-04 MED ORDER — SODIUM CHLORIDE 0.9 % IV SOLN: INTRAVENOUS | Status: DC

## 2020-04-04 MED ORDER — LACTATED RINGERS IV SOLN: INTRAVENOUS | Status: DC

## 2020-04-04 MED ORDER — VASOPRESSIN 20 UNIT/ML IV SOLN
INTRAVENOUS | Status: DC | PRN
  Administered 2020-04-04 (×2): 1 [IU] via INTRAVENOUS

## 2020-04-04 MED ORDER — SODIUM CHLORIDE 0.9 % IV SOLN
INTRAVENOUS | Status: DC | PRN
  Administered 2020-04-04: 500 mL

## 2020-04-04 MED ORDER — PHENYLEPHRINE HCL-NACL 10-0.9 MG/250ML-% IV SOLN
INTRAVENOUS | Status: DC | PRN
  Administered 2020-04-04: 60 ug/min via INTRAVENOUS

## 2020-04-04 MED ORDER — OXYCODONE-ACETAMINOPHEN 5-325 MG PO TABS: 1.0000 | ORAL_TABLET | Freq: Four times a day (QID) | ORAL | 0 refills | Status: DC | PRN

## 2020-04-04 MED ORDER — CHLORHEXIDINE GLUCONATE 0.12 % MT SOLN: 15.0000 mL | Freq: Once | OROMUCOSAL | Status: DC

## 2020-04-04 MED ORDER — FENTANYL CITRATE (PF) 100 MCG/2ML IJ SOLN
INTRAMUSCULAR | Status: DC | PRN
  Administered 2020-04-04: 25 ug via INTRAVENOUS

## 2020-04-04 MED ORDER — LIDOCAINE HCL (PF) 2 % IJ SOLN
INTRAMUSCULAR | Status: AC
  Filled 2020-04-04: qty 5

## 2020-04-04 MED ORDER — GLYCOPYRROLATE PF 0.2 MG/ML IJ SOSY
PREFILLED_SYRINGE | INTRAMUSCULAR | Status: AC
  Filled 2020-04-04: qty 1

## 2020-04-04 MED ORDER — 0.9 % SODIUM CHLORIDE (POUR BTL) OPTIME
TOPICAL | Status: DC | PRN
  Administered 2020-04-04: 1000 mL

## 2020-04-04 MED ORDER — FENTANYL CITRATE (PF) 100 MCG/2ML IJ SOLN
INTRAMUSCULAR | Status: AC
  Filled 2020-04-04: qty 2

## 2020-04-04 MED ORDER — PROPOFOL 10 MG/ML IV BOLUS
INTRAVENOUS | Status: AC
  Filled 2020-04-04: qty 20

## 2020-04-04 MED ORDER — LIDOCAINE HCL (CARDIAC) PF 50 MG/5ML IV SOSY
PREFILLED_SYRINGE | INTRAVENOUS | Status: DC | PRN
  Administered 2020-04-04: 40 mg via INTRAVENOUS

## 2020-04-04 MED ORDER — FENTANYL CITRATE (PF) 100 MCG/2ML IJ SOLN: 25.0000 ug | INTRAMUSCULAR | Status: DC | PRN

## 2020-04-04 MED ORDER — LIDOCAINE-EPINEPHRINE 0.5 %-1:200000 IJ SOLN
INTRAMUSCULAR | Status: DC | PRN
  Administered 2020-04-04: 8 mL

## 2020-04-04 MED ORDER — PHENYLEPHRINE 40 MCG/ML (10ML) SYRINGE FOR IV PUSH (FOR BLOOD PRESSURE SUPPORT)
PREFILLED_SYRINGE | INTRAVENOUS | Status: DC | PRN
  Administered 2020-04-04: 120 ug via INTRAVENOUS
  Administered 2020-04-04: 80 ug via INTRAVENOUS
  Administered 2020-04-04: 120 ug via INTRAVENOUS

## 2020-04-04 MED ORDER — VANCOMYCIN HCL IN DEXTROSE 1-5 GM/200ML-% IV SOLN
1000.0000 mg | INTRAVENOUS | Status: AC
  Administered 2020-04-04: 1000 mg via INTRAVENOUS
  Filled 2020-04-04: qty 200

## 2020-04-04 MED ORDER — ONDANSETRON HCL 4 MG/2ML IJ SOLN
INTRAMUSCULAR | Status: AC
  Filled 2020-04-04: qty 2

## 2020-04-04 MED ORDER — ORAL CARE MOUTH RINSE: 15.0000 mL | Freq: Once | OROMUCOSAL | Status: DC

## 2020-04-04 MED ORDER — KETAMINE HCL 50 MG/5ML IJ SOSY
PREFILLED_SYRINGE | INTRAMUSCULAR | Status: AC
  Filled 2020-04-04: qty 5

## 2020-04-04 MED ORDER — VASOPRESSIN 20 UNIT/ML IV SOLN
INTRAVENOUS | Status: AC
  Filled 2020-04-04: qty 1

## 2020-04-04 MED ORDER — DEXAMETHASONE SODIUM PHOSPHATE 10 MG/ML IJ SOLN
INTRAMUSCULAR | Status: AC
  Filled 2020-04-04: qty 1

## 2020-04-04 MED ORDER — ONDANSETRON HCL 4 MG/2ML IJ SOLN
INTRAMUSCULAR | Status: DC | PRN
  Administered 2020-04-04: 4 mg via INTRAVENOUS

## 2020-04-04 MED ORDER — CHLORHEXIDINE GLUCONATE 4 % EX LIQD: 60.0000 mL | Freq: Once | CUTANEOUS | Status: DC

## 2020-04-04 MED ORDER — GLYCOPYRROLATE PF 0.2 MG/ML IJ SOSY
PREFILLED_SYRINGE | INTRAMUSCULAR | Status: DC | PRN
  Administered 2020-04-04: .1 mg via INTRAVENOUS

## 2020-04-04 MED ORDER — HEPARIN SODIUM (PORCINE) 1000 UNIT/ML IJ SOLN
INTRAMUSCULAR | Status: AC
  Filled 2020-04-04: qty 6

## 2020-04-04 SURGICAL SUPPLY — 41 items
ADH SKN CLS APL DERMABOND .7 (GAUZE/BANDAGES/DRESSINGS) ×1
ARMBAND PINK RESTRICT EXTREMIT (MISCELLANEOUS) ×3 IMPLANT
BAG DECANTER FOR FLEXI CONT (MISCELLANEOUS) ×1 IMPLANT
BAG HAMPER (MISCELLANEOUS) ×2 IMPLANT
CANNULA VESSEL 3MM 2 BLNT TIP (CANNULA) ×2 IMPLANT
CLIP LIGATING EXTRA MED SLVR (CLIP) ×2 IMPLANT
CLIP LIGATING EXTRA SM BLUE (MISCELLANEOUS) ×2 IMPLANT
COVER LIGHT HANDLE STERIS (MISCELLANEOUS) ×4 IMPLANT
COVER WAND RF STERILE (DRAPES) ×2 IMPLANT
DECANTER SPIKE VIAL GLASS SM (MISCELLANEOUS) ×2 IMPLANT
DERMABOND ADVANCED (GAUZE/BANDAGES/DRESSINGS) ×1
DERMABOND ADVANCED .7 DNX12 (GAUZE/BANDAGES/DRESSINGS) ×1 IMPLANT
ELECT REM PT RETURN 9FT ADLT (ELECTROSURGICAL) ×2
ELECTRODE REM PT RTRN 9FT ADLT (ELECTROSURGICAL) ×1 IMPLANT
GAUZE SPONGE 4X4 12PLY STRL (GAUZE/BANDAGES/DRESSINGS) ×4 IMPLANT
GLOVE BIOGEL PI IND STRL 7.0 (GLOVE) ×3 IMPLANT
GLOVE BIOGEL PI INDICATOR 7.0 (GLOVE) ×3
GLOVE SS BIOGEL STRL SZ 7.5 (GLOVE) ×1 IMPLANT
GLOVE SUPERSENSE BIOGEL SZ 7.5 (GLOVE) ×1
GOWN STRL REUS W/TWL LRG LVL3 (GOWN DISPOSABLE) ×10 IMPLANT
IV NS 500ML (IV SOLUTION) ×4
IV NS 500ML BAXH (IV SOLUTION) ×2 IMPLANT
KIT BLADEGUARD II DBL (SET/KITS/TRAYS/PACK) ×2 IMPLANT
KIT TURNOVER KIT A (KITS) ×2 IMPLANT
MANIFOLD NEPTUNE II (INSTRUMENTS) ×2 IMPLANT
MARKER SKIN DUAL TIP RULER LAB (MISCELLANEOUS) ×4 IMPLANT
NDL HYPO 18GX1.5 BLUNT FILL (NEEDLE) IMPLANT
NEEDLE HYPO 18GX1.5 BLUNT FILL (NEEDLE) ×2 IMPLANT
NS IRRIG 1000ML POUR BTL (IV SOLUTION) ×2 IMPLANT
PACK CV ACCESS (CUSTOM PROCEDURE TRAY) ×2 IMPLANT
PAD ARMBOARD 7.5X6 YLW CONV (MISCELLANEOUS) ×4 IMPLANT
SET BASIN LINEN APH (SET/KITS/TRAYS/PACK) ×2 IMPLANT
SUT PROLENE 6 0 CC (SUTURE) ×4 IMPLANT
SUT SILK 2 0 PERMA HAND 18 BK (SUTURE) IMPLANT
SUT VIC AB 3-0 SH 27 (SUTURE) ×4
SUT VIC AB 3-0 SH 27X BRD (SUTURE) ×2 IMPLANT
SYR 10ML LL (SYRINGE) ×1 IMPLANT
SYR CONTROL 10ML LL (SYRINGE) ×1 IMPLANT
SYR TOOMEY 50ML (SYRINGE) IMPLANT
TOWEL GREEN STERILE (TOWEL DISPOSABLE) ×2 IMPLANT
UNDERPAD 30X36 HEAVY ABSORB (UNDERPADS AND DIAPERS) ×2 IMPLANT

## 2020-04-04 NOTE — Op Note (Signed)
    OPERATIVE REPORT  DATE OF SURGERY: 04/04/2020  PATIENT: Carly Wood, 85 y.o. female MRN: [id number]  DOB: [birth year]  PRE-OPERATIVE DIAGNOSIS: End-stage renal disease  POST-OPERATIVE DIAGNOSIS:  Same  PROCEDURE: Right brachiocephalic AV fistula creation  SURGEON:  Curt Jews, M.D.  PHYSICIAN ASSISTANT: Nurse  The assistant was needed for exposure and to expedite the case  ANESTHESIA: LMA  EBL: per anesthesia record  Total I/O In: 600 [I.V.:600] Out: -   BLOOD ADMINISTERED: none  DRAINS: none  SPECIMEN: none  COUNTS CORRECT:  YES  PATIENT DISPOSITION:  PACU - hemodynamically stable  PROCEDURE DETAILS: Patient was taken up and placed to observe the area of the right arm prepped draped you sterile fashion.  SonoSite ultrasound was used to visualize the cephalic and basilic vein.  Both of these were patent and did appear to be adequate size for fistula creation.  The cephalic vein ran relatively close to the skin.  With the supplemental local anesthesia incision was made over the antecubital space and carried down to isolate the cephalic vein which was of good caliber.  The vein was ligated distally and divided and was gently dilated and was felt to be adequate for fistula creation.  The brachial artery was exposed to the same incision and was of good caliber with minimal atherosclerotic change.  The artery was occluded proximally and distally and was opened with an 11 blade sent longstanding with Potts scissors.  The vein was cut to the appropriate length and was spatulated and sewn end-to-side to the artery with a running 6-0 Prolene suture.  Clamps were removed and excellent thrill was noted.  The wounds were irrigated with saline.  Hemostasis was obtained with electrocautery.  The wounds were closed with 3-0 Vicryl in the subcutaneous and subcuticular tissue.  Sterile dressing was applied and the patient was transferred to the recovery in stable condition   Rosetta Posner, M.D., [facility] 04/04/2020 10:41 AM

## 2020-04-04 NOTE — Anesthesia Procedure Notes (Signed)
Procedure Name: LMA Insertion Date/Time: 04/04/2020 9:33 AM Performed by: Vista Deck, CRNA Pre-anesthesia Checklist: Patient identified, Patient being monitored, Emergency Drugs available, Timeout performed and Suction available Patient Re-evaluated:Patient Re-evaluated prior to induction Oxygen Delivery Method: Circle System Utilized Preoxygenation: Pre-oxygenation with 100% oxygen Induction Type: IV induction LMA: LMA inserted LMA Size: 4.0 Number of attempts: 3 Placement Confirmation: positive ETCO2 and breath sounds checked- equal and bilateral Tube secured with: Tape Dental Injury: Teeth and Oropharynx as per pre-operative assessment  Comments: Poor seating on first two attempts.

## 2020-04-04 NOTE — Transfer of Care (Signed)
Immediate Anesthesia Transfer of Care Note  Patient: Carly Wood  Procedure(s) Performed: RIGHT ARM ARTERIOVENOUS FISTULA CREATION (Right Arm Lower)  Patient Location: PACU  Anesthesia Type:General  Level of Consciousness: drowsy  Airway & Oxygen Therapy: Patient Spontanous Breathing and Patient connected to face mask oxygen  Post-op Assessment: Report given to RN and Post -op Vital signs reviewed and stable  Post vital signs: Reviewed and stable  Last Vitals:  Vitals Value Taken Time  BP 123/81 04/04/20 1039  Temp    Pulse 90 04/04/20 1040  Resp 29 04/04/20 1040  SpO2 100 % 04/04/20 1040  Vitals shown include unvalidated device data.  Last Pain:  Vitals:   04/04/20 0846  TempSrc: Oral  PainSc: 5       Patients Stated Pain Goal: 6 (43/20/03 7944)  Complications: No complications documented.

## 2020-04-04 NOTE — Progress Notes (Signed)
Report called to Imagene Sheller, RN at [facility].  Discharge instructions in evelope with patient.

## 2020-04-04 NOTE — Anesthesia Preprocedure Evaluation (Signed)
Anesthesia Evaluation  Patient identified by MRN, date of birth, ID band Patient awake    Reviewed: Allergy & Precautions, H&P , NPO status , Patient's Chart, lab work & pertinent test results, reviewed documented beta blocker date and time   History of Anesthesia Complications (+) Emergence Delirium and history of anesthetic complications  Airway Mallampati: II  TM Distance: >3 FB Neck ROM: full    Dental no notable dental hx.    Pulmonary shortness of breath, pneumonia, resolved,    Pulmonary exam normal breath sounds clear to auscultation       Cardiovascular Exercise Tolerance: Good hypertension, negative cardio ROS   Rhythm:regular Rate:Normal     Neuro/Psych PSYCHIATRIC DISORDERS Anxiety Depression Dementia negative neurological ROS     GI/Hepatic Neg liver ROS, PUD, GERD  Medicated,  Endo/Other  negative endocrine ROSdiabetes  Renal/GU ESRF and DialysisRenal disease  negative genitourinary   Musculoskeletal   Abdominal   Peds  Hematology  (+) Blood dyscrasia, anemia ,   Anesthesia Other Findings   Reproductive/Obstetrics negative OB ROS                             Anesthesia Physical Anesthesia Plan  ASA: III  Anesthesia Plan: General   Post-op Pain Management:    Induction:   PONV Risk Score and Plan: Ondansetron  Airway Management Planned:   Additional Equipment:   Intra-op Plan:   Post-operative Plan:   Informed Consent: I have reviewed the patients History and Physical, chart, labs and discussed the procedure including the risks, benefits and alternatives for the proposed anesthesia with the patient or authorized representative who has indicated his/her understanding and acceptance.     Dental Advisory Given  Plan Discussed with: CRNA  Anesthesia Plan Comments:         Anesthesia Quick Evaluation

## 2020-04-04 NOTE — Interval H&P Note (Signed)
History and Physical Interval Note:  04/04/2020 8:43 AM  Carly Wood  has presented today for surgery, with the diagnosis of ESRD.  The various methods of treatment have been discussed with the patient and family. After consideration of risks, benefits and other options for treatment, the patient has consented to  Procedure(s): RIGHT ARM ARTERIOVENOUS FISTULA VERSUS ARTERIOVENOUS (AV) GORE-TEX GRAFT (Right) as a surgical intervention.  The patient's history has been reviewed, patient examined, no change in status, stable for surgery.  I have reviewed the patient's chart and labs.  Questions were answered to the patient's satisfaction.     Curt Jews

## 2020-04-04 NOTE — Discharge Instructions (Signed)
[facility]  Discharge Instructions  AV Fistula or Graft Surgery for Dialysis Access  Please refer to the following instructions for your post-procedure care. Your surgeon or physician assistant will discuss any changes with you.  Activity  You may drive the day following your surgery, if you are comfortable and no longer taking prescription pain medication. Resume full activity as the soreness in your incision resolves.  Bathing/Showering  You may shower after you go home. Keep your incision dry for 48 hours. Do not soak in a bathtub, hot tub, or swim until the incision heals completely. You may not shower if you have a hemodialysis catheter.  Incision Care  Clean your incision with mild soap and water after 48 hours. Pat the area dry with a clean towel. You do not need a bandage unless otherwise instructed. Do not apply any ointments or creams to your incision. You may have skin glue on your incision. Do not peel it off. It will come off on its own in about one week. Your arm may swell a bit after surgery. To reduce swelling use pillows to elevate your arm so it is above your heart. Your doctor will tell you if you need to lightly wrap your arm with an ACE bandage.  Diet  Resume your normal diet. There are not special food restrictions following this procedure. In order to heal from your surgery, it is CRITICAL to get adequate nutrition. Your body requires vitamins, minerals, and protein. Vegetables are the best source of vitamins and minerals. Vegetables also provide the perfect balance of protein. Processed food has little nutritional value, so try to avoid this.  Medications  Resume taking all of your medications. If your incision is causing pain, you may take over-the counter pain relievers such as acetaminophen (Tylenol). If you were prescribed a stronger pain medication, please be aware these medications can cause nausea and constipation. Prevent  nausea by taking the medication with a snack or meal. Avoid constipation by drinking plenty of fluids and eating foods with high amount of fiber, such as fruits, vegetables, and grains.  Do not take Tylenol if you are taking prescription pain medications.  Follow up Your surgeon may want to see you in the office following your access surgery. If so, this will be arranged at the time of your surgery.  Please call us immediately for any of the following conditions:   Increased pain, redness, drainage (pus) from your incision site  Fever of 101 degrees or higher  Severe or worsening pain at your incision site  Hand pain or numbness.   Reduce your risk of vascular disease:   Stop smoking. If you would like help, call [facility] at 1-800-QUIT-NOW 2161889878) or [place] at [place] your cholesterol  Maintain a desired weight  Control your diabetes  Keep your blood pressure down  Dialysis  It will take several weeks to several months for your new dialysis access to be ready for use. Your surgeon will determine when it is okay to use it. Your nephrologist will continue to direct your dialysis. You can continue to use your Permcath until your new access is ready for use.   04/04/2020 Carly Wood [id number] 08/28/1935  Surgeon(s): Early, Arvilla Meres, MD  Procedure(s): RIGHT ARM ARTERIOVENOUS FISTULA CREATION   May stick graft immediately   May stick graft on designated area only:    Do not stick fistula for 12 weeks    If you  have any questions, please call the office at [phone].     General Anesthesia, Adult, Care After This sheet gives you information about how to care for yourself after your procedure. Your health care provider may also give you more specific instructions. If you have problems or questions, contact your health care provider. What can I expect after the procedure? After the procedure, the following side effects are  common:  Pain or discomfort at the IV site.  Nausea.  Vomiting.  Sore throat.  Trouble concentrating.  Feeling cold or chills.  Feeling weak or tired.  Sleepiness and fatigue.  Soreness and body aches. These side effects can affect parts of the body that were not involved in surgery. Follow these instructions at home: For the time period you were told by your health care provider:  Rest.  Do not participate in activities where you could fall or become injured.  Do not drive or use machinery.  Do not drink alcohol.  Do not take sleeping pills or medicines that cause drowsiness.  Do not make important decisions or sign legal documents.  Do not take care of children on your own.   Eating and drinking  Follow any instructions from your health care provider about eating or drinking restrictions.  When you feel hungry, start by eating small amounts of foods that are soft and easy to digest (bland), such as toast. Gradually return to your regular diet.  Drink enough fluid to keep your urine pale yellow.  If you vomit, rehydrate by drinking water, juice, or clear broth. General instructions  If you have sleep apnea, surgery and certain medicines can increase your risk for breathing problems. Follow instructions from your health care provider about wearing your sleep device: ? Anytime you are sleeping, including during daytime naps. ? While taking prescription pain medicines, sleeping medicines, or medicines that make you drowsy.  Have a responsible adult stay with you for the time you are told. It is important to have someone help care for you until you are awake and alert.  Return to your normal activities as told by your health care provider. Ask your health care provider what activities are safe for you.  Take over-the-counter and prescription medicines only as told by your health care provider.  If you smoke, do not smoke without supervision.  Keep all follow-up  visits as told by your health care provider. This is important. Contact a health care provider if:  You have nausea or vomiting that does not get better with medicine.  You cannot eat or drink without vomiting.  You have pain that does not get better with medicine.  You are unable to pass urine.  You develop a skin rash.  You have a fever.  You have redness around your IV site that gets worse. Get help right away if:  You have difficulty breathing.  You have chest pain.  You have blood in your urine or stool, or you vomit blood. Summary  After the procedure, it is common to have a sore throat or nausea. It is also common to feel tired.  Have a responsible adult stay with you for the time you are told. It is important to have someone help care for you until you are awake and alert.  When you feel hungry, start by eating small amounts of foods that are soft and easy to digest (bland), such as toast. Gradually return to your regular diet.  Drink enough fluid to keep your urine pale  yellow.  Return to your normal activities as told by your health care provider. Ask your health care provider what activities are safe for you. This information is not intended to replace advice given to you by your health care provider. Make sure you discuss any questions you have with your health care provider. Document Revised: 11/09/2019 Document Reviewed: 06/08/2019 Elsevier Patient Education  2021 [place].    Acetaminophen; Oxycodone tablets What is this medicine? ACETAMINOPHEN; OXYCODONE (a set a MEE noe fen; ox i KOE done) is a pain reliever. It is used to treat moderate to severe pain. This medicine may be used for other purposes; ask your health care provider or pharmacist if you have questions. COMMON BRAND NAME(S): Endocet, Magnacet, Nalocet, Narvox, Percocet, Perloxx, Primalev, Primlev, Prolate, Roxicet, Xolox What should I tell my health care provider before I take this  medicine? They need to know if you have any of these conditions:  brain tumor  drug abuse or addiction  head injury  heart disease  if you often drink alcohol   kidney disease   liver disease  low adrenal gland function  lung disease, asthma, or breathing problem  seizures  stomach or intestine problems  taken an MAOI like Marplan, Nardil, or Parnate in the last 14 days  an unusual or allergic reaction to acetaminophen, oxycodone, other medicines, foods, dyes, or preservative  pregnant or trying to get pregnant  breast-feeding How should I use this medicine? Take this medicine by mouth with a full glass of water. Take it as directed on the label. You can take it with or without food. If it upsets your stomach, take it with food. Do not use it more often than directed. There may be unused or extra doses in the bottle after you finish your treatment. Talk to your health care provider if you have questions about your dose. A special MedGuide will be given to you by the pharmacist with each prescription and refill. Be sure to read this information carefully each time. Talk to your health care provider about the use of this medicine in children. Special care may be needed. Patients over 42 years of age may have a stronger reaction and need a smaller dose. Overdosage: If you think you have taken too much of this medicine contact a poison control center or emergency room at once. NOTE: This medicine is only for you. Do not share this medicine with others. What if I miss a dose? This does not apply. This medicine is not for regular use. It should only be used as needed. What may interact with this medicine? This medicine may interact with the following medications:  alcohol  antihistamines for allergy, cough and cold  antiviral medicines for HIV or AIDS  atropine  certain antibiotics like clarithromycin, erythromycin, linezolid, rifampin  certain medicines for anxiety  or sleep  certain medicines for bladder problems like oxybutynin, tolterodine  certain medicines for depression like amitriptyline, fluoxetine, sertraline  certain medicines for fungal infections like ketoconazole, itraconazole, voriconazole  certain medicines for migraine headache like almotriptan, eletriptan, frovatriptan, naratriptan, rizatriptan, sumatriptan, zolmitriptan  certain medicines for nausea or vomiting like dolasetron, ondansetron, palonosetron  certain medicines for Parkinson's disease like benztropine, trihexyphenidyl  certain medicines for seizures like phenobarbital, phenytoin, primidone  certain medicines for stomach problems like dicyclomine, hyoscyamine  certain medicines for travel sickness like scopolamine  diuretics  general anesthetics like halothane, isoflurane, methoxyflurane, propofol  ipratropium  local anesthetics like lidocaine, pramoxine, tetracaine  MAOIs like  Carbex, Eldepryl, Marplan, Nardil, and Parnate  medicines that relax muscles for surgery  methylene blue  nilotinib  other medicines with acetaminophen  other narcotic medicines for pain or cough  phenothiazines like chlorpromazine, mesoridazine, prochlorperazine, thioridazine This list may not describe all possible interactions. Give your health care provider a list of all the medicines, herbs, non-prescription drugs, or dietary supplements you use. Also tell them if you smoke, drink alcohol, or use illegal drugs. Some items may interact with your medicine. What should I watch for while using this medicine? Tell your health care provider if your pain does not go away, if it gets worse, or if you have new or a different type of pain. You may develop tolerance to this drug. Tolerance means that you will need a higher dose of the drug for pain relief. Tolerance is normal and is expected if you take this drug for a long time. There are different types of narcotic drugs (opioids) for  pain. If you take more than one type at the same time, you may have more side effects. Give your health care provider a list of all drugs you use. He or she will tell you how much drug to take. Do not take more drug than directed. Get emergency help right away if you have problems breathing. Do not suddenly stop taking your drug because you may develop a severe reaction. Your body becomes used to the drug. This does NOT mean you are addicted. Addiction is a behavior related to getting and using a drug for a nonmedical reason. If you have pain, you have a medical reason to take pain drug. Your health care provider will tell you how much drug to take. If your health care provider wants you to stop the drug, the dose will be slowly lowered over time to avoid any side effects. Talk to your health care provider about naloxone and how to get it. Naloxone is an emergency drug used for an opioid overdose. An overdose can happen if you take too much opioid. It can also happen if an opioid is taken with some other drugs or substances, like alcohol. Know the symptoms of an overdose, like trouble breathing, unusually tired or sleepy, or not being able to respond or wake up. Make sure to tell caregivers and close contacts where it is stored. Make sure they know how to use it. After naloxone is given, you must get emergency help right away. Naloxone is a temporary treatment. Repeat doses may be needed. Do not take other drugs that contain acetaminophen with this drug. Many non-prescription drugs contain acetaminophen. Always read labels carefully. If you have questions, ask your health care provider. If you take too much acetaminophen, get medical help right away. Too much acetaminophen can be very dangerous and cause liver damage. Even if you do not have symptoms, it is important to get help right away. This drug does not prevent a heart attack or stroke. This drug may increase the chance of a heart attack or stroke. The  chance may increase the longer you use this drug or if you have heart disease. If you take aspirin to prevent a heart attack or stroke, talk to your health care provider about using this drug. You may get drowsy or dizzy. Do not drive, use machinery, or do anything that needs mental alertness until you know how this drug affects you. Do not stand up or sit up quickly, especially if you are an older patient. This reduces the  risk of dizzy or fainting spells. Alcohol may interfere with the effect of this drug. Avoid alcoholic drinks. This drug will cause constipation. If you do not have a bowel movement for 3 days, call your health care provider. Your mouth may get dry. Chewing sugarless gum or sucking hard candy and drinking plenty of water may help. Contact your health care provider if the problem does not go away or is severe. What side effects may I notice from receiving this medicine? Side effects that you should report to your doctor or health care professional as soon as possible:  allergic reactions (skin rash, itching or hives; swelling of the face, lips, or tongue)  confusion  kidney injury (trouble passing urine or change in the amount of urine)  light-colored stool  liver injury (dark yellow or brown urine; general ill feeling or flu-like symptoms; loss of appetite, right upper belly pain; unusually weak or tired, yellowing of the eyes or skin)  low adrenal gland function (nausea; vomiting; loss of appetite; unusually weak or tired; dizziness; low blood pressure)  low blood pressure (dizziness; feeling faint or lightheaded, falls; unusually weak or tired)  redness, blistering, peeling, or loosening of the skin, including inside the mouth  serotonin syndrome (irritable; confusion; diarrhea; fast or irregular heartbeat; muscle twitching; stiff muscles; trouble walking; sweating; high fever; seizures; chills; vomiting)  trouble breathing Side effects that usually do not require  medical attention (report to your doctor or health care professional if they continue or are bothersome):  constipation  dry mouth  nausea, vomiting  tiredness This list may not describe all possible side effects. Call your doctor for medical advice about side effects. You may report side effects to FDA at 1-800-FDA-1088. Where should I keep my medicine? Keep out of the reach of children and pets. This medicine can be abused. Keep it in a safe place to protect it from theft. Do not share it with anyone. It is only for you. Selling or giving away this medicine is dangerous and against the law. Store at room temperature between 20 and 25 degrees C (68 and 77 degrees F). Protect from light. Get rid of any unused medicine after the expiration date. This medicine may cause harm and death if it is taken by other adults, children, or pets. It is important to get rid of the medicine as soon as you no longer need it or it is expired. You can do this in two ways:  Take the medicine to a medicine take-back program. Check with your pharmacy or law enforcement to find a location.  If you cannot return the medicine, flush it down the toilet. NOTE: This sheet is a summary. It may not cover all possible information. If you have questions about this medicine, talk to your doctor, pharmacist, or health care provider.  2021 Elsevier/Gold Standard (2019-11-22 11:12:15)

## 2020-04-05 ENCOUNTER — Encounter (HOSPITAL_COMMUNITY): Payer: Self-pay | Admitting: Vascular Surgery

## 2020-04-05 DIAGNOSIS — Z23 Encounter for immunization: Secondary | ICD-10-CM | POA: Diagnosis not present

## 2020-04-05 DIAGNOSIS — Z1159 Encounter for screening for other viral diseases: Secondary | ICD-10-CM | POA: Diagnosis not present

## 2020-04-05 DIAGNOSIS — Z992 Dependence on renal dialysis: Secondary | ICD-10-CM | POA: Diagnosis not present

## 2020-04-05 DIAGNOSIS — N186 End stage renal disease: Secondary | ICD-10-CM | POA: Diagnosis not present

## 2020-04-05 DIAGNOSIS — C9000 Multiple myeloma not having achieved remission: Secondary | ICD-10-CM | POA: Diagnosis not present

## 2020-04-05 NOTE — Anesthesia Postprocedure Evaluation (Signed)
Anesthesia Post Note  Patient: Carly Wood  Procedure(s) Performed: RIGHT ARM ARTERIOVENOUS FISTULA CREATION (Right Arm Lower)  Patient location during evaluation: PACU Anesthesia Type: General Level of consciousness: awake Pain management: pain level controlled Vital Signs Assessment: post-procedure vital signs reviewed and stable Respiratory status: spontaneous breathing Cardiovascular status: blood pressure returned to baseline Anesthetic complications: no   No complications documented.   Last Vitals:  Vitals:   04/04/20 1100 04/04/20 1124  BP: 122/61 (!) 126/58  Pulse: 80 78  Resp: (!) 25 (!) 22  Temp:  36.6 C  SpO2: 100% 100%    Last Pain:  Vitals:   04/04/20 1124  TempSrc: Oral  PainSc: 0-No pain                 Louann Sjogren

## 2020-04-08 DIAGNOSIS — Z1159 Encounter for screening for other viral diseases: Secondary | ICD-10-CM | POA: Diagnosis not present

## 2020-04-08 DIAGNOSIS — C9000 Multiple myeloma not having achieved remission: Secondary | ICD-10-CM | POA: Diagnosis not present

## 2020-04-08 DIAGNOSIS — Z23 Encounter for immunization: Secondary | ICD-10-CM | POA: Diagnosis not present

## 2020-04-08 DIAGNOSIS — N186 End stage renal disease: Secondary | ICD-10-CM | POA: Diagnosis not present

## 2020-04-08 DIAGNOSIS — Z992 Dependence on renal dialysis: Secondary | ICD-10-CM | POA: Diagnosis not present

## 2020-04-10 ENCOUNTER — Telehealth: Payer: Self-pay

## 2020-04-10 DIAGNOSIS — Z992 Dependence on renal dialysis: Secondary | ICD-10-CM | POA: Diagnosis not present

## 2020-04-10 DIAGNOSIS — N186 End stage renal disease: Secondary | ICD-10-CM | POA: Diagnosis not present

## 2020-04-10 DIAGNOSIS — C9000 Multiple myeloma not having achieved remission: Secondary | ICD-10-CM | POA: Diagnosis not present

## 2020-04-10 DIAGNOSIS — Z1159 Encounter for screening for other viral diseases: Secondary | ICD-10-CM | POA: Diagnosis not present

## 2020-04-10 NOTE — Telephone Encounter (Signed)
Charles from [facility] called - he has not seen the patient, but had a report from nursing home that fistula arm was swollen. Placed 1 week ago by [facility]. Said it did not sound like there were any s/s of infection and they could feel a thrill and auscultate a bruit. Asked him about elevating the arm and he says he will advise them to elevate patient arm.

## 2020-04-12 DIAGNOSIS — Z1159 Encounter for screening for other viral diseases: Secondary | ICD-10-CM | POA: Diagnosis not present

## 2020-04-12 DIAGNOSIS — C9000 Multiple myeloma not having achieved remission: Secondary | ICD-10-CM | POA: Diagnosis not present

## 2020-04-12 DIAGNOSIS — Z992 Dependence on renal dialysis: Secondary | ICD-10-CM | POA: Diagnosis not present

## 2020-04-12 DIAGNOSIS — N186 End stage renal disease: Secondary | ICD-10-CM | POA: Diagnosis not present

## 2020-04-15 DIAGNOSIS — Z992 Dependence on renal dialysis: Secondary | ICD-10-CM | POA: Diagnosis not present

## 2020-04-15 DIAGNOSIS — Z1159 Encounter for screening for other viral diseases: Secondary | ICD-10-CM | POA: Diagnosis not present

## 2020-04-15 DIAGNOSIS — N186 End stage renal disease: Secondary | ICD-10-CM | POA: Diagnosis not present

## 2020-04-15 DIAGNOSIS — C9000 Multiple myeloma not having achieved remission: Secondary | ICD-10-CM | POA: Diagnosis not present

## 2020-04-16 ENCOUNTER — Inpatient Hospital Stay (HOSPITAL_COMMUNITY): Payer: Medicare PPO

## 2020-04-16 ENCOUNTER — Inpatient Hospital Stay (HOSPITAL_COMMUNITY): Payer: Medicare PPO | Attending: Hematology

## 2020-04-16 ENCOUNTER — Other Ambulatory Visit: Payer: Self-pay

## 2020-04-16 VITALS — BP 121/50 | HR 87 | Temp 97.2°F | Resp 18

## 2020-04-16 DIAGNOSIS — C9000 Multiple myeloma not having achieved remission: Secondary | ICD-10-CM | POA: Insufficient documentation

## 2020-04-16 DIAGNOSIS — Z5111 Encounter for antineoplastic chemotherapy: Secondary | ICD-10-CM | POA: Insufficient documentation

## 2020-04-16 LAB — COMPREHENSIVE METABOLIC PANEL
ALT: 12 U/L (ref 0–44)
AST: 18 U/L (ref 15–41)
Albumin: 3.7 g/dL (ref 3.5–5.0)
Alkaline Phosphatase: 80 U/L (ref 38–126)
Anion gap: 15 (ref 5–15)
BUN: 43 mg/dL — ABNORMAL HIGH (ref 8–23)
CO2: 27 mmol/L (ref 22–32)
Calcium: 8.7 mg/dL — ABNORMAL LOW (ref 8.9–10.3)
Chloride: 95 mmol/L — ABNORMAL LOW (ref 98–111)
Creatinine, Ser: 7.03 mg/dL — ABNORMAL HIGH (ref 0.44–1.00)
GFR, Estimated: 5 mL/min — ABNORMAL LOW (ref >60)
Glucose, Bld: 122 mg/dL — ABNORMAL HIGH (ref 70–99)
Potassium: 4 mmol/L (ref 3.5–5.1)
Sodium: 137 mmol/L (ref 135–145)
Total Bilirubin: 0.4 mg/dL (ref 0.3–1.2)
Total Protein: 6.9 g/dL (ref 6.5–8.1)

## 2020-04-16 LAB — CBC WITH DIFFERENTIAL/PLATELET
Abs Immature Granulocytes: 0.02 10*3/uL (ref 0.00–0.07)
Basophils Absolute: 0.1 10*3/uL (ref 0.0–0.1)
Basophils Relative: 1 %
Eosinophils Absolute: 0.2 10*3/uL (ref 0.0–0.5)
Eosinophils Relative: 4 %
HCT: 33 % — ABNORMAL LOW (ref 36.0–46.0)
Hemoglobin: 10.3 g/dL — ABNORMAL LOW (ref 12.0–15.0)
Immature Granulocytes: 0 %
Lymphocytes Relative: 27 %
Lymphs Abs: 1.5 10*3/uL (ref 0.7–4.0)
MCH: 33.6 pg (ref 26.0–34.0)
MCHC: 31.2 g/dL (ref 30.0–36.0)
MCV: 107.5 fL — ABNORMAL HIGH (ref 80.0–100.0)
Monocytes Absolute: 0.6 10*3/uL (ref 0.1–1.0)
Monocytes Relative: 11 %
Neutro Abs: 3 10*3/uL (ref 1.7–7.7)
Neutrophils Relative %: 57 %
Platelets: 323 10*3/uL (ref 150–400)
RBC: 3.07 MIL/uL — ABNORMAL LOW (ref 3.87–5.11)
RDW: 16.1 % — ABNORMAL HIGH (ref 11.5–15.5)
WBC: 5.3 10*3/uL (ref 4.0–10.5)
nRBC: 0 % (ref 0.0–0.2)

## 2020-04-16 LAB — LACTATE DEHYDROGENASE: LDH: 153 U/L (ref 98–192)

## 2020-04-16 MED ORDER — BORTEZOMIB CHEMO SQ INJECTION 3.5 MG (2.5MG/ML)
1.3000 mg/m2 | Freq: Once | INTRAMUSCULAR | Status: AC
  Administered 2020-04-16: 2.25 mg via SUBCUTANEOUS
  Filled 2020-04-16: qty 0.9

## 2020-04-16 MED ORDER — PROCHLORPERAZINE MALEATE 10 MG PO TABS
10.0000 mg | ORAL_TABLET | Freq: Once | ORAL | Status: AC
  Administered 2020-04-16: 10 mg via ORAL
  Filled 2020-04-16: qty 1

## 2020-04-16 MED ORDER — DEXAMETHASONE 4 MG PO TABS
10.0000 mg | ORAL_TABLET | Freq: Once | ORAL | Status: AC
  Administered 2020-04-16: 10 mg via ORAL
  Filled 2020-04-16: qty 3

## 2020-04-16 NOTE — Progress Notes (Signed)
Patient presents for Velcade injection.  Vital signs WNL.  Labs pending.  No new complaints since last visit.  Creatinine is 7.03 Dr. Hardie Lora, okay for treatment.  Velcade given today per MD orders.  Tolerated injection without adverse affects.  Injection site WNL.  Vital signs stable.  No complaints at this time.  Discharge from clinic via wheelchair in stable condition.  Alert and oriented X 3.  Follow up with [facility] as scheduled.

## 2020-04-16 NOTE — Patient Instructions (Signed)
[facility] Discharge Instructions for Patients Receiving Chemotherapy  Today you received the following chemotherapy agents   To help prevent nausea and vomiting after your treatment, we encourage you to take your nausea medication   If you develop nausea and vomiting that is not controlled by your nausea medication, call the clinic.   BELOW ARE SYMPTOMS THAT SHOULD BE REPORTED IMMEDIATELY:  *FEVER GREATER THAN 100.5 F  *CHILLS WITH OR WITHOUT FEVER  NAUSEA AND VOMITING THAT IS NOT CONTROLLED WITH YOUR NAUSEA MEDICATION  *UNUSUAL SHORTNESS OF BREATH  *UNUSUAL BRUISING OR BLEEDING  TENDERNESS IN MOUTH AND THROAT WITH OR WITHOUT PRESENCE OF ULCERS  *URINARY PROBLEMS  *BOWEL PROBLEMS  UNUSUAL RASH Items with * indicate a potential emergency and should be followed up as soon as possible.  Feel free to call the clinic should you have any questions or concerns. The clinic phone number is [phone].  Please show the CHEMO ALERT CARD at check-in to the Emergency Department and triage nurse.   

## 2020-04-17 ENCOUNTER — Ambulatory Visit (INDEPENDENT_AMBULATORY_CARE_PROVIDER_SITE_OTHER): Payer: Self-pay | Admitting: Physician Assistant

## 2020-04-17 VITALS — BP 134/73 | HR 73 | Temp 98.3°F | Resp 20 | Ht 63.5 in | Wt 130.0 lb

## 2020-04-17 DIAGNOSIS — Z1159 Encounter for screening for other viral diseases: Secondary | ICD-10-CM | POA: Diagnosis not present

## 2020-04-17 DIAGNOSIS — Z992 Dependence on renal dialysis: Secondary | ICD-10-CM

## 2020-04-17 DIAGNOSIS — N186 End stage renal disease: Secondary | ICD-10-CM

## 2020-04-17 DIAGNOSIS — C9000 Multiple myeloma not having achieved remission: Secondary | ICD-10-CM | POA: Diagnosis not present

## 2020-04-17 MED ORDER — ACETAMINOPHEN 325 MG PO TABS
ORAL_TABLET | ORAL | Status: AC
  Filled 2020-04-17: qty 2

## 2020-04-17 MED ORDER — FAMOTIDINE 20 MG PO TABS
ORAL_TABLET | ORAL | Status: AC
  Filled 2020-04-17: qty 1

## 2020-04-17 NOTE — Progress Notes (Signed)
    Postoperative Access Visit   History of Present Illness   Carly Wood is a 85 y.o. year old female who presents for postoperative follow-up for: right brachiocephalic arteriovenous fistula (Date: 04/04/20) by Dr. Donnetta Hutching.  She is currently dialyzing via right IJ [facility] on a Tuesday Thursday Saturday schedule.  She currently resides in a nursing facility and is accompanied by a staff member today.  She states her incision is well-healed.  She denies any pain or numbness or weakness in her right hand.  She presents today with complaint of right upper extremity edema.  She believes it progressively has worsened since surgery.   Physical Examination   Vitals:   04/17/20 1306  BP: 134/73  Pulse: 73  Resp: 20  Temp: 98.3 F (36.8 C)  TempSrc: Temporal  SpO2: 98%  Weight: 130 lb (59 kg)  Height: 5' 3.5" (1.613 m)   Body mass index is 22.67 kg/m.  right arm Incision is healed, palpable radial pulse, hand grip is 5/5, sensation in digits is intact, palpable thrill, bruit can be auscultated; pitting edema in the hand and forearm    Medical Decision Making   Carly Wood is a 85 y.o. year old female who presents s/p right brachiocephalic arteriovenous fistula   Patent fistula without signs or symptoms of steal syndrome  Encouraged the patient to elevate her arm and exercise her hand during the day  Patient was wrapped with an Ace bandage from her hand up to her elbow; she should be rewrapped daily  We will reevaluate right upper extremity edema in 2 to 3 weeks of using conservative measures; if edema worsens we will schedule patient for a venogram of her right upper extremity and potentially move her [facility] to the left side pending results of venogram   Dagoberto Ligas PA-C [facility] [place] Office: [phone]  Clinic MD: Scot Dock

## 2020-04-18 DIAGNOSIS — N186 End stage renal disease: Secondary | ICD-10-CM | POA: Diagnosis not present

## 2020-04-18 DIAGNOSIS — Z992 Dependence on renal dialysis: Secondary | ICD-10-CM | POA: Diagnosis not present

## 2020-04-19 DIAGNOSIS — Z1159 Encounter for screening for other viral diseases: Secondary | ICD-10-CM | POA: Diagnosis not present

## 2020-04-19 DIAGNOSIS — N186 End stage renal disease: Secondary | ICD-10-CM | POA: Diagnosis not present

## 2020-04-19 DIAGNOSIS — Z992 Dependence on renal dialysis: Secondary | ICD-10-CM | POA: Diagnosis not present

## 2020-04-19 DIAGNOSIS — C9000 Multiple myeloma not having achieved remission: Secondary | ICD-10-CM | POA: Diagnosis not present

## 2020-04-22 DIAGNOSIS — Z1159 Encounter for screening for other viral diseases: Secondary | ICD-10-CM | POA: Diagnosis not present

## 2020-04-22 DIAGNOSIS — C9000 Multiple myeloma not having achieved remission: Secondary | ICD-10-CM | POA: Diagnosis not present

## 2020-04-22 DIAGNOSIS — E1140 Type 2 diabetes mellitus with diabetic neuropathy, unspecified: Secondary | ICD-10-CM | POA: Diagnosis not present

## 2020-04-22 DIAGNOSIS — N186 End stage renal disease: Secondary | ICD-10-CM | POA: Diagnosis not present

## 2020-04-22 DIAGNOSIS — L602 Onychogryphosis: Secondary | ICD-10-CM | POA: Diagnosis not present

## 2020-04-22 DIAGNOSIS — Z992 Dependence on renal dialysis: Secondary | ICD-10-CM | POA: Diagnosis not present

## 2020-04-23 ENCOUNTER — Inpatient Hospital Stay (HOSPITAL_COMMUNITY): Payer: Medicare PPO

## 2020-04-23 ENCOUNTER — Encounter (HOSPITAL_COMMUNITY): Payer: Self-pay

## 2020-04-23 ENCOUNTER — Non-Acute Institutional Stay (SKILLED_NURSING_FACILITY): Payer: Medicare PPO | Admitting: Adult Health

## 2020-04-23 ENCOUNTER — Encounter: Payer: Self-pay | Admitting: Adult Health

## 2020-04-23 ENCOUNTER — Other Ambulatory Visit: Payer: Self-pay

## 2020-04-23 VITALS — BP 91/35 | HR 71 | Temp 96.9°F | Resp 18 | Wt 139.6 lb

## 2020-04-23 DIAGNOSIS — R1310 Dysphagia, unspecified: Secondary | ICD-10-CM | POA: Diagnosis not present

## 2020-04-23 DIAGNOSIS — C9000 Multiple myeloma not having achieved remission: Secondary | ICD-10-CM

## 2020-04-23 DIAGNOSIS — Z5111 Encounter for antineoplastic chemotherapy: Secondary | ICD-10-CM | POA: Diagnosis not present

## 2020-04-23 DIAGNOSIS — N186 End stage renal disease: Secondary | ICD-10-CM | POA: Diagnosis not present

## 2020-04-23 DIAGNOSIS — M6281 Muscle weakness (generalized): Secondary | ICD-10-CM | POA: Diagnosis not present

## 2020-04-23 LAB — COMPREHENSIVE METABOLIC PANEL
ALT: 14 U/L (ref 0–44)
AST: 25 U/L (ref 15–41)
Albumin: 3.8 g/dL (ref 3.5–5.0)
Alkaline Phosphatase: 87 U/L (ref 38–126)
Anion gap: 16 — ABNORMAL HIGH (ref 5–15)
BUN: 43 mg/dL — ABNORMAL HIGH (ref 8–23)
CO2: 25 mmol/L (ref 22–32)
Calcium: 8.7 mg/dL — ABNORMAL LOW (ref 8.9–10.3)
Chloride: 94 mmol/L — ABNORMAL LOW (ref 98–111)
Creatinine, Ser: 7.55 mg/dL — ABNORMAL HIGH (ref 0.44–1.00)
GFR, Estimated: 5 mL/min — ABNORMAL LOW (ref >60)
Glucose, Bld: 267 mg/dL — ABNORMAL HIGH (ref 70–99)
Potassium: 4.8 mmol/L (ref 3.5–5.1)
Sodium: 135 mmol/L (ref 135–145)
Total Bilirubin: 0.6 mg/dL (ref 0.3–1.2)
Total Protein: 6.9 g/dL (ref 6.5–8.1)

## 2020-04-23 LAB — CBC WITH DIFFERENTIAL/PLATELET
Abs Immature Granulocytes: 0.03 10*3/uL (ref 0.00–0.07)
Basophils Absolute: 0 10*3/uL (ref 0.0–0.1)
Basophils Relative: 1 %
Eosinophils Absolute: 0.2 10*3/uL (ref 0.0–0.5)
Eosinophils Relative: 3 %
HCT: 34.3 % — ABNORMAL LOW (ref 36.0–46.0)
Hemoglobin: 10.5 g/dL — ABNORMAL LOW (ref 12.0–15.0)
Immature Granulocytes: 0 %
Lymphocytes Relative: 16 %
Lymphs Abs: 1.1 10*3/uL (ref 0.7–4.0)
MCH: 33.1 pg (ref 26.0–34.0)
MCHC: 30.6 g/dL (ref 30.0–36.0)
MCV: 108.2 fL — ABNORMAL HIGH (ref 80.0–100.0)
Monocytes Absolute: 0.6 10*3/uL (ref 0.1–1.0)
Monocytes Relative: 9 %
Neutro Abs: 4.9 10*3/uL (ref 1.7–7.7)
Neutrophils Relative %: 71 %
Platelets: 261 10*3/uL (ref 150–400)
RBC: 3.17 MIL/uL — ABNORMAL LOW (ref 3.87–5.11)
RDW: 16.3 % — ABNORMAL HIGH (ref 11.5–15.5)
WBC: 6.8 10*3/uL (ref 4.0–10.5)
nRBC: 0 % (ref 0.0–0.2)

## 2020-04-23 MED ORDER — DEXAMETHASONE 4 MG PO TABS
10.0000 mg | ORAL_TABLET | Freq: Once | ORAL | Status: AC
  Administered 2020-04-23: 10 mg via ORAL
  Filled 2020-04-23: qty 3

## 2020-04-23 MED ORDER — PROCHLORPERAZINE MALEATE 10 MG PO TABS
10.0000 mg | ORAL_TABLET | Freq: Once | ORAL | Status: AC
  Administered 2020-04-23: 10 mg via ORAL
  Filled 2020-04-23: qty 1

## 2020-04-23 MED ORDER — BORTEZOMIB CHEMO SQ INJECTION 3.5 MG (2.5MG/ML)
1.3000 mg/m2 | Freq: Once | INTRAMUSCULAR | Status: AC
  Administered 2020-04-23: 2.25 mg via SUBCUTANEOUS
  Filled 2020-04-23: qty 0.9

## 2020-04-23 NOTE — Progress Notes (Signed)
Location:  [place] Room Number: 148-D Place of Service:  SNF (31)   CODE STATUS: full code   Allergies  Allergen Reactions  . Penicillins Other (See Comments)    Unsure of reaction Has patient had a PCN reaction causing immediate rash, facial/tongue/throat swelling, SOB or lightheadedness with hypotension: Unknown Has patient had a PCN reaction causing severe rash involving mucus membranes or skin necrosis: Unknown Has patient had a PCN reaction that required hospitalization: No Has patient had a PCN reaction occurring within the last 10 years: Unknown If all of the above answers are "NO", then may proceed with Cephalosporin use.    . Ace Inhibitors Cough and Other (See Comments)    Tongue swell  . Lisinopril Other (See Comments)    Tongue swell    Chief Complaint  Patient presents with  . Acute Visit    Dysphagia    HPI:  Yesterday she spit out her pills after having a difficult time swallowing them. She does not want to have her medications crushed. She hs being seen by ST for her swallowing difficulties. She did get angry yesterday and threw her drink. She will not eat when she has difficulty swallowing. There are no indications of aspiration present.   Past Medical History:  Diagnosis Date  . Anemia   . Anxiety   . Chronic kidney disease   . Chronic renal disease, stage 4, severely decreased glomerular filtration rate (GFR) between 15-29 mL/min/1.73 square meter (HCC) 08/22/2015  . Complication of anesthesia    delirious after Breast Surgery  . Dementia ([place])    mild  . Depression   . Diabetes mellitus without complication ([place])    type II  . Dyspnea    with activity  . GERD (gastroesophageal reflux disease)   . Glaucoma   . Hypertension   . Pneumonia   . Stage 1 infiltrating ductal carcinoma of right female breast ([place] [facility]) 08/21/2015   ER+ PR+ HER 2 neu + (3+) T1cN0     Past Surgical History:  Procedure Laterality Date  . AV  FISTULA PLACEMENT Left 11/22/2017   Procedure: ARTERIOVENOUS (AV) FISTULA CREATION LEFT ARM;  Surgeon: Elam Dutch, MD;  Location: [facility] OR;  Service: Vascular;  Laterality: Left;  . AV FISTULA PLACEMENT Right 04/04/2020   Procedure: RIGHT ARM ARTERIOVENOUS FISTULA CREATION;  Surgeon: Rosetta Posner, MD;  Location: [facility];  Service: Vascular;  Laterality: Right;  . BIOPSY  08/07/2016   Procedure: BIOPSY;  Surgeon: Daneil Dolin, MD;  [facility];  Service: Endoscopy;;  gastric ulcer biopsy  . COLONOSCOPY    . ESOPHAGOGASTRODUODENOSCOPY N/A 08/07/2016   Procedure: ESOPHAGOGASTRODUODENOSCOPY (EGD);  Surgeon: Daneil Dolin, MD;  [facility];  Service: Endoscopy;  Laterality: N/A;  1215-rescheduled to 6/1 @ 2:30pm per Tretha Sciara  . ESOPHAGOGASTRODUODENOSCOPY N/A 11/27/2016   Procedure: ESOPHAGOGASTRODUODENOSCOPY (EGD);  Surgeon: Daneil Dolin, MD;  [facility];  Service: Endoscopy;  Laterality: N/A;  8:15am  . FISTULA SUPERFICIALIZATION Left 02/14/2018   Procedure: FISTULA SUPERFICIALIZATION LEFT ARM;  Surgeon: Angelia Mould, MD;  Location: [place];  Service: Vascular;  Laterality: Left;  . FRACTURE SURGERY Right    ankle  . MALONEY DILATION N/A 08/07/2016   Procedure: Venia Minks DILATION;  Surgeon: Daneil Dolin, MD;  [facility];  Service: Endoscopy;  Laterality: N/A;  . MASTECTOMY, PARTIAL Right   . PERIPHERAL VASCULAR BALLOON ANGIOPLASTY Left 07/13/2019   Procedure: PERIPHERAL VASCULAR  BALLOON ANGIOPLASTY;  Surgeon: Marty Heck, MD;  Location: [place] CV LAB;  Service: Cardiovascular;  Laterality: Left;  arm fistulogram  . PORT-A-CATH REMOVAL Left 11/22/2017   Procedure: REMOVAL PORT-A-CATH LEFT CHEST;  Surgeon: Elam Dutch, MD;  Location: [facility] OR;  Service: Vascular;  Laterality: Left;  . RETINAL DETACHMENT SURGERY Right     Social History   Socioeconomic History  . Marital status: Single    Spouse name: Not on file  .  Number of children: Not on file  . Years of education: Not on file  . Highest education level: Not on file  Occupational History  . Occupation: retired   Tobacco Use  . Smoking status: Never Smoker  . Smokeless tobacco: Never Used  Vaping Use  . Vaping Use: Never used  Substance and Sexual Activity  . Alcohol use: No    Alcohol/week: 0.0 standard drinks  . Drug use: No  . Sexual activity: Never  Other Topics Concern  . Not on file  Social History Narrative   Long term resident of SNF    Social Determinants of Health   Financial Resource Strain: Low Risk   . Difficulty of Paying Living Expenses: Not very hard  Food Insecurity: No Food Insecurity  . Worried About Charity fundraiser in the Last Year: Never true  . Ran Out of Food in the Last Year: Never true  Transportation Needs: No Transportation Needs  . Lack of Transportation (Medical): No  . Lack of Transportation (Non-Medical): No  Physical Activity: Inactive  . Days of Exercise per Week: 0 days  . Minutes of Exercise per Session: 0 min  Stress: No Stress Concern Present  . Feeling of Stress : Not at all  Social Connections: Moderately Isolated  . Frequency of Communication with Friends and Family: More than three times a week  . Frequency of Social Gatherings with Friends and Family: Once a week  . Attends Religious Services: More than 4 times per year  . Active Member of Clubs or Organizations: No  . Attends Archivist Meetings: Never  . Marital Status: Never married  Intimate Partner Violence: Not At Risk  . Fear of Current or Ex-Partner: No  . Emotionally Abused: No  . Physically Abused: No  . Sexually Abused: No   Family History  Problem Relation Age of Onset  . Multiple myeloma Sister   . Brain cancer Sister   . Dementia Mother        died at 82  . Stroke Mother   . Heart failure Mother   . Diabetes Mother   . Heart disease Father   . Prostate cancer Brother   . Colon cancer Neg Hx        VITAL SIGNS BP 135/73   Pulse 81   Temp 98.1 F (36.7 C)   Resp 20   Ht '5\' 3"'  (1.6 m)   Wt 144 lb (65.3 kg)   SpO2 98%   BMI 25.51 kg/m   Outpatient Encounter Medications as of 04/23/2020  Medication Sig  . aspirin EC 81 MG tablet Take 81 mg by mouth daily.  . NON FORMULARY Diet: _____ Regular, ___x___ NAS, _______Consistent Carbohydrate, _______NPO __x___Other Low Potassium  . NON FORMULARY Wanderguard #3310 to ankle for safety awareness. Check placement and function qshift. Every Shift Day, Evening, Night  . acetaminophen (TYLENOL) 650 MG CR tablet Take 650 mg by mouth every 6 (six) hours.  Marland Kitchen acyclovir (ZOVIRAX) 200 MG capsule Take  200 mg by mouth daily.  Marland Kitchen amLODipine (NORVASC) 10 MG tablet Take 10 mg by mouth daily. Not taking on dialysis days  . anastrozole (ARIMIDEX) 1 MG tablet TAKE 1 TABLET BY MOUTH DAILY (Patient taking differently: Take 1 mg by mouth daily.)  . calcium acetate (PHOSLO) 667 MG capsule Take 667-1,334 mg by mouth See admin instructions. Take 1,334 mg twice daily with meals and 667 mg once daily with snacks  . calcium-vitamin D (OSCAL WITH D) 500-200 MG-UNIT tablet Take 1 tablet by mouth daily. At 8 am  . cloNIDine (CATAPRES) 0.3 MG tablet Take 0.3 mg by mouth 2 (two) times daily.   . insulin aspart (NOVOLOG FLEXPEN) 100 UNIT/ML FlexPen Inject 10-15 Units into the skin See admin instructions. 10 units at 8 am and 5 pm, 15 units at 12 noon  . insulin glargine (LANTUS SOLOSTAR) 100 UNIT/ML Solostar Pen Inject 10 Units into the skin at bedtime.  . melatonin 3 MG TABS tablet Take 6 mg by mouth at bedtime. For Sleep  . Multiple Vitamin (MULTIVITAMIN) capsule Take 1 capsule by mouth daily.  . Nutritional Supplements (NEPRO PO) Take 1 Can by mouth at bedtime.  Marland Kitchen oxyCODONE-acetaminophen (PERCOCET) 5-325 MG tablet Take 1 tablet by mouth every 6 (six) hours as needed for severe pain.   No facility-administered encounter medications on file as of 04/23/2020.      SIGNIFICANT DIAGNOSTIC EXAMS   LABS REVIEWED PREVIOUS  10-13-19: hgb a1c 6.0; vit B 12: 1126 folate 20.9 10-17-19: wbc 8.8; hgb 8.3; hct 26.1; mcv 114.0 plt 270; glucose 190; bun 28; creat 4.85; k+ 3.9; na++ 127; ca 8.9 liver normal albumin 4.0  10-31-19: wbc 6.0; hgb 8.8; hct 28.6; mcv 117.2 plt 326; glucose 188; bun 45; creat 6.28; k+ 3.9; na++ 137;ca 9.1 liver normal albumin 3.9 11-07-19: wbc 6.1; hgb 9.2; hct 29.4 mcv 115.7 plt 276; glucose 201; bun 44; creat 5.93; k+ 4.5; na++ 136; ca 9.1 liver normal albumin 3.8 11-14-19; wbc 8.3; hgb 9.7; hct 31.2 mcv 116.4 plt 261; glucose 198; bun 44; creat 6.66; k+ 5.1; na++ 138; ca 8.8 liver normal albumin 3.9   12-12-19: wbc 7.6; hgb 11.3; hct 35.6; mcv 109.9 plt 212; glucose 602; bun 67; creat 8.62; k+ 5.6; na++ 135; ca 7.9 liver normal albumin 3.5  12-20-19: hgb a1c 8.0  01-09-20: wbc 6.9; hgb 12.1; hct 39.7; mcv 105.6 plt 299; glucose 225; bun 112; creat 14.53; k+ 6.4; na++ 132; ca 8.3 liver normal albumin 3.5  01-23-20: k+ 6.6  01-23-20: wbc 7.9; hgb 11.6; hct 36.9; mcv 103.7 plt 359; glucose 139; bun 109; creat 15.46; k+ 6.1; na++ 140; ca 8.7 liver normal albumin 4.1;  01-24-20: k+ 4.4  01-30-20: wbc 10.6; hgb 9.7; hct 32.0; mcv 103.6 plt 390; glucose 164; bun 44; creat 9.08; k+ 4.1; na++ 139; ca 9.0 liver normal albumin 3.3 02-06-20: wbc 7.8; hgb 9.9; hct 32.7 mcv 104.8 plt 344; glucose 211; bun 90; creat 10.97; k+ 6.0 na++ 137; ca 8.8 liver normal albumin 3.4  03-26-20: wbc 6.3; hgb 9.4; hct 30.4; mcv 104.1; plt 300; glucose 223; bun 101; creat 14.22; k+ 5.8; na++ 135; ca 8.1 GFR 2; liver normal albumin 3.4   NO NEW LABS.   Review of Systems  Constitutional: Negative for malaise/fatigue.  Respiratory: Negative for cough and shortness of breath.   Cardiovascular: Negative for chest pain, palpitations and leg swelling.  Gastrointestinal: Negative for abdominal pain, constipation and heartburn.  Musculoskeletal: Negative for back pain, joint  pain and myalgias.  Skin: Negative.   Neurological: Negative for dizziness.       Increased difficulty swallowing   Psychiatric/Behavioral: The patient is not nervous/anxious.     Physical Exam Constitutional:      General: She is not in acute distress.    Appearance: She is well-developed and well-nourished. She is not diaphoretic.  Neck:     Thyroid: No thyromegaly.  Cardiovascular:     Rate and Rhythm: Normal rate and regular rhythm.     Pulses: Normal pulses and intact distal pulses.     Heart sounds: Normal heart sounds.  Pulmonary:     Effort: Pulmonary effort is normal. No respiratory distress.     Breath sounds: Normal breath sounds.  Abdominal:     General: Bowel sounds are normal. There is no distension.     Palpations: Abdomen is soft.     Tenderness: There is no abdominal tenderness.  Musculoskeletal:        General: No edema. Normal range of motion.     Cervical back: Neck supple.     Right lower leg: No edema.     Left lower leg: No edema.  Lymphadenopathy:     Cervical: No cervical adenopathy.  Skin:    General: Skin is warm and dry.     Comments: Right arm in ace wrap   Neurological:     Mental Status: She is alert. Mental status is at baseline.  Psychiatric:        Mood and Affect: Mood and affect and mood normal.       ASSESSMENT/ PLAN:  TODAY;   1. Dysphagia unspecified type:  Will continue ST as directed and will monitor     MD is aware of resident's narcotic use and is in agreement with current plan of care. We will attempt to wean resident as appropriate.  Ok Edwards NP [facility]  Contact [phone] Monday through Friday 8am- 5pm  After hours call [phone]

## 2020-04-23 NOTE — Progress Notes (Signed)
Serum Creatinine 7.55 today.  Dr. Delton Coombes notified.   Ok to treat today verbal order Dr. Delton Coombes.  Parameters fixed in treatment plan.   Patient tolerated Velcade injection with no complaints voiced.  Lab work reviewed.  See MAR for details.  Injection site clean and dry with no bruising or swelling noted.  Band aid applied.  VSS.  Patient left in satisfactory condition with no s/s of distress noted.

## 2020-04-23 NOTE — Progress Notes (Signed)
Location:  [place] Room Number: 148-D Place of Service:  SNF (31)   CODE STATUS: Full Code   Allergies  Allergen Reactions  . Penicillins Other (See Comments)    Unsure of reaction Has patient had a PCN reaction causing immediate rash, facial/tongue/throat swelling, SOB or lightheadedness with hypotension: Unknown Has patient had a PCN reaction causing severe rash involving mucus membranes or skin necrosis: Unknown Has patient had a PCN reaction that required hospitalization: No Has patient had a PCN reaction occurring within the last 10 years: Unknown If all of the above answers are "NO", then may proceed with Cephalosporin use.    . Ace Inhibitors Cough and Other (See Comments)    Tongue swell  . Lisinopril Other (See Comments)    Tongue swell    Chief Complaint  Patient presents with  . Acute Visit    Dysphagia    HPI:    Past Medical History:  Diagnosis Date  . Anemia   . Anxiety   . Chronic kidney disease   . Chronic renal disease, stage 4, severely decreased glomerular filtration rate (GFR) between 15-29 mL/min/1.73 square meter (HCC) 08/22/2015  . Complication of anesthesia    delirious after Breast Surgery  . Dementia ([place])    mild  . Depression   . Diabetes mellitus without complication ([place])    type II  . Dyspnea    with activity  . GERD (gastroesophageal reflux disease)   . Glaucoma   . Hypertension   . Pneumonia   . Stage 1 infiltrating ductal carcinoma of right female breast ([place]) 08/21/2015   ER+ PR+ HER 2 neu + (3+) T1cN0     Past Surgical History:  Procedure Laterality Date  . AV FISTULA PLACEMENT Left 11/22/2017   Procedure: ARTERIOVENOUS (AV) FISTULA CREATION LEFT ARM;  Surgeon: Elam Dutch, MD;  Location: [facility] OR;  Service: Vascular;  Laterality: Left;  . AV FISTULA PLACEMENT Right 04/04/2020   Procedure: RIGHT ARM ARTERIOVENOUS FISTULA CREATION;  Surgeon: Rosetta Posner, MD;  Location: [facility];  Service:  Vascular;  Laterality: Right;  . BIOPSY  08/07/2016   Procedure: BIOPSY;  Surgeon: Daneil Dolin, MD;  [facility];  Service: Endoscopy;;  gastric ulcer biopsy  . COLONOSCOPY    . ESOPHAGOGASTRODUODENOSCOPY N/A 08/07/2016   Procedure: ESOPHAGOGASTRODUODENOSCOPY (EGD);  Surgeon: Daneil Dolin, MD;  [facility];  Service: Endoscopy;  Laterality: N/A;  1215-rescheduled to 6/1 @ 2:30pm per Tretha Sciara  . ESOPHAGOGASTRODUODENOSCOPY N/A 11/27/2016   Procedure: ESOPHAGOGASTRODUODENOSCOPY (EGD);  Surgeon: Daneil Dolin, MD;  [facility];  Service: Endoscopy;  Laterality: N/A;  8:15am  . FISTULA SUPERFICIALIZATION Left 02/14/2018   Procedure: FISTULA SUPERFICIALIZATION LEFT ARM;  Surgeon: Angelia Mould, MD;  Location: [place];  Service: Vascular;  Laterality: Left;  . FRACTURE SURGERY Right    ankle  . MALONEY DILATION N/A 08/07/2016   Procedure: Venia Minks DILATION;  Surgeon: Daneil Dolin, MD;  [facility];  Service: Endoscopy;  Laterality: N/A;  . MASTECTOMY, PARTIAL Right   . PERIPHERAL VASCULAR BALLOON ANGIOPLASTY Left 07/13/2019   Procedure: PERIPHERAL VASCULAR BALLOON ANGIOPLASTY;  Surgeon: Marty Heck, MD;  Location: [place] [facility] LAB;  Service: Cardiovascular;  Laterality: Left;  arm fistulogram  . PORT-A-CATH REMOVAL Left 11/22/2017   Procedure: REMOVAL PORT-A-CATH LEFT CHEST;  Surgeon: Elam Dutch, MD;  Location: [place];  Service: Vascular;  Laterality: Left;  . RETINAL DETACHMENT SURGERY  Right     Social History   Socioeconomic History  . Marital status: Single    Spouse name: Not on file  . Number of children: Not on file  . Years of education: Not on file  . Highest education level: Not on file  Occupational History  . Occupation: retired   Tobacco Use  . Smoking status: Never Smoker  . Smokeless tobacco: Never Used  Vaping Use  . Vaping Use: Never used  Substance and Sexual Activity  . Alcohol use: No     Alcohol/week: 0.0 standard drinks  . Drug use: No  . Sexual activity: Never  Other Topics Concern  . Not on file  Social History Narrative   Long term resident of SNF    Social Determinants of Health   Financial Resource Strain: Low Risk   . Difficulty of Paying Living Expenses: Not very hard  Food Insecurity: No Food Insecurity  . Worried About Charity fundraiser in the Last Year: Never true  . Ran Out of Food in the Last Year: Never true  Transportation Needs: No Transportation Needs  . Lack of Transportation (Medical): No  . Lack of Transportation (Non-Medical): No  Physical Activity: Inactive  . Days of Exercise per Week: 0 days  . Minutes of Exercise per Session: 0 min  Stress: No Stress Concern Present  . Feeling of Stress : Not at all  Social Connections: Moderately Isolated  . Frequency of Communication with Friends and Family: More than three times a week  . Frequency of Social Gatherings with Friends and Family: Once a week  . Attends Religious Services: More than 4 times per year  . Active Member of Clubs or Organizations: No  . Attends Archivist Meetings: Never  . Marital Status: Never married  Intimate Partner Violence: Not At Risk  . Fear of Current or Ex-Partner: No  . Emotionally Abused: No  . Physically Abused: No  . Sexually Abused: No   Family History  Problem Relation Age of Onset  . Multiple myeloma Sister   . Brain cancer Sister   . Dementia Mother        died at 40  . Stroke Mother   . Heart failure Mother   . Diabetes Mother   . Heart disease Father   . Prostate cancer Brother   . Colon cancer Neg Hx       VITAL SIGNS BP 135/73   Pulse 81   Temp 98.1 F (36.7 C)   Resp 20   Ht _0  (1.6 m)   Wt 144 lb (65.3 kg)   SpO2 98%   BMI 25.51 kg/m   Outpatient Encounter Medications as of 04/23/2020  Medication Sig  . acetaminophen (TYLENOL) 650 MG CR tablet Take 650 mg by mouth every 6 (six) hours.  Marland Kitchen acyclovir (ZOVIRAX)  200 MG capsule Take 200 mg by mouth daily.  Marland Kitchen amLODipine (NORVASC) 10 MG tablet Take 10 mg by mouth daily. Not taking on dialysis days  . anastrozole (ARIMIDEX) 1 MG tablet TAKE 1 TABLET BY MOUTH DAILY  . aspirin EC 81 MG tablet Take 81 mg by mouth daily.  . calcium acetate (PHOSLO) 667 MG capsule Take 667-1,334 mg by mouth See admin instructions. Take 1,334 mg twice daily with meals and 667 mg once daily with snacks  . calcium-vitamin D (OSCAL WITH D) 500-200 MG-UNIT tablet Take 1 tablet by mouth daily. At 8 am  . cloNIDine (CATAPRES) 0.3 MG tablet Take  0.3 mg by mouth 2 (two) times daily.   . insulin aspart (NOVOLOG FLEXPEN) 100 UNIT/ML FlexPen Inject 10 Units into the skin 2 (two) times daily. 8 am and 5 pm, Hold for accu-check less than 150.  Marland Kitchen insulin aspart (NOVOLOG FLEXPEN) 100 UNIT/ML FlexPen Inject 15 Units into the skin daily at 12 noon. 12 pm,  Hold for accu-check less than 150  . insulin glargine (LANTUS SOLOSTAR) 100 UNIT/ML Solostar Pen Inject 10 Units into the skin at bedtime.  . melatonin 3 MG TABS tablet Take 6 mg by mouth at bedtime. For Sleep  . Multiple Vitamin (MULTIVITAMIN) capsule Take 1 capsule by mouth daily.  . NON FORMULARY Diet: _____ Regular, ___x___ NAS, _______Consistent Carbohydrate, _______NPO __x___Other Low Potassium  . NON FORMULARY Wanderguard #3310 to ankle for safety awareness. Check placement and function qshift. Every Shift Day, Evening, Night  . Nutritional Supplements (NEPRO PO) Take 1 Can by mouth at bedtime.  . [DISCONTINUED] oxyCODONE-acetaminophen (PERCOCET) 5-325 MG tablet Take 1 tablet by mouth every 6 (six) hours as needed for severe pain.   No facility-administered encounter medications on file as of 04/23/2020.     SIGNIFICANT DIAGNOSTIC EXAMS       ASSESSMENT/ PLAN:    MD is aware of resident's narcotic use and is in agreement with current plan of care. We will attempt to wean resident as appropriate.  Ok Edwards  NP [facility]  Contact [phone] Monday through Friday 8am- 5pm  After hours call [phone]

## 2020-04-24 DIAGNOSIS — R1310 Dysphagia, unspecified: Secondary | ICD-10-CM | POA: Diagnosis not present

## 2020-04-24 DIAGNOSIS — Z992 Dependence on renal dialysis: Secondary | ICD-10-CM | POA: Diagnosis not present

## 2020-04-24 DIAGNOSIS — N186 End stage renal disease: Secondary | ICD-10-CM | POA: Diagnosis not present

## 2020-04-24 DIAGNOSIS — M6281 Muscle weakness (generalized): Secondary | ICD-10-CM | POA: Diagnosis not present

## 2020-04-24 DIAGNOSIS — C9000 Multiple myeloma not having achieved remission: Secondary | ICD-10-CM | POA: Diagnosis not present

## 2020-04-24 DIAGNOSIS — Z1159 Encounter for screening for other viral diseases: Secondary | ICD-10-CM | POA: Diagnosis not present

## 2020-04-24 LAB — KAPPA/LAMBDA LIGHT CHAINS
Kappa free light chain: 163.5 mg/L — ABNORMAL HIGH (ref 3.3–19.4)
Kappa, lambda light chain ratio: 0.84 (ref 0.26–1.65)
Lambda free light chains: 195.2 mg/L — ABNORMAL HIGH (ref 5.7–26.3)

## 2020-04-25 ENCOUNTER — Encounter: Payer: Self-pay | Admitting: Adult Health

## 2020-04-25 ENCOUNTER — Non-Acute Institutional Stay (SKILLED_NURSING_FACILITY): Payer: Medicare PPO | Admitting: Adult Health

## 2020-04-25 DIAGNOSIS — N186 End stage renal disease: Secondary | ICD-10-CM

## 2020-04-25 DIAGNOSIS — D631 Anemia in chronic kidney disease: Secondary | ICD-10-CM

## 2020-04-25 DIAGNOSIS — F0151 Vascular dementia with behavioral disturbance: Secondary | ICD-10-CM

## 2020-04-25 DIAGNOSIS — I700 Atherosclerosis of aorta: Secondary | ICD-10-CM

## 2020-04-25 DIAGNOSIS — F01518 Vascular dementia, unspecified severity, with other behavioral disturbance: Secondary | ICD-10-CM

## 2020-04-25 LAB — PROTEIN ELECTROPHORESIS, SERUM
A/G Ratio: 1.3 (ref 0.7–1.7)
Albumin ELP: 3.5 g/dL (ref 2.9–4.4)
Alpha-1-Globulin: 0.3 g/dL (ref 0.0–0.4)
Alpha-2-Globulin: 0.9 g/dL (ref 0.4–1.0)
Beta Globulin: 0.9 g/dL (ref 0.7–1.3)
Gamma Globulin: 0.7 g/dL (ref 0.4–1.8)
Globulin, Total: 2.7 g/dL (ref 2.2–3.9)
Total Protein ELP: 6.2 g/dL (ref 6.0–8.5)

## 2020-04-25 NOTE — Progress Notes (Signed)
Location:  [facility] Room Number: 148/D Place of Service:  SNF (31)   CODE STATUS: Full Code  Allergies  Allergen Reactions  . Penicillins Other (See Comments)    Unsure of reaction Has patient had a PCN reaction causing immediate rash, facial/tongue/throat swelling, SOB or lightheadedness with hypotension: Unknown Has patient had a PCN reaction causing severe rash involving mucus membranes or skin necrosis: Unknown Has patient had a PCN reaction that required hospitalization: No Has patient had a PCN reaction occurring within the last 10 years: Unknown If all of the above answers are "NO", then may proceed with Cephalosporin use.    . Ace Inhibitors Cough and Other (See Comments)    Tongue swell  . Lisinopril Other (See Comments)    Tongue swell    Chief Complaint  Patient presents with  . Acute Visit    Care Plan Meeting     HPI:  We have come together for her care plan meeting. BIMS 13/15 mood 0/30. Family present. . She requires supervision to limited assist with her adls. She is occasionally incontinent of bladder and bowel. She has had one fall without injury. She feeds herself. She continues dialysis m-w-f. her weight is 144 pounds with 75-100% intake with meals. She has a new a/v fistula right arm; with old one in left arm. Her cbg's are stable. She is being seen by ST for dysphagia. Has wander guard  Has history of problems with big pills. Is doing well with ST; may be D/C from therapy next week. Does attend some activities. There are no reports of uncontrolled pain present. She continues to be followed for her chronic illnesses including: Aortic atherosclerosis  Vascular dementia with behavioral disturbance Anemia due to end stage renal disease   Past Medical History:  Diagnosis Date  . Anemia   . Anxiety   . Chronic kidney disease   . Chronic renal disease, stage 4, severely decreased glomerular filtration rate (GFR) between 15-29  mL/min/1.73 square meter (HCC) 08/22/2015  . Complication of anesthesia    delirious after Breast Surgery  . Dementia ([place])    mild  . Depression   . Diabetes mellitus without complication ([place])    type II  . Dyspnea    with activity  . GERD (gastroesophageal reflux disease)   . Glaucoma   . Hypertension   . Pneumonia   . Stage 1 infiltrating ductal carcinoma of right female breast ([place]) 08/21/2015   ER+ PR+ HER 2 neu + (3+) T1cN0     Past Surgical History:  Procedure Laterality Date  . AV FISTULA PLACEMENT Left 11/22/2017   Procedure: ARTERIOVENOUS (AV) FISTULA CREATION LEFT ARM;  Surgeon: Elam Dutch, MD;  Location: [facility] OR;  Service: Vascular;  Laterality: Left;  . AV FISTULA PLACEMENT Right 04/04/2020   Procedure: RIGHT ARM ARTERIOVENOUS FISTULA CREATION;  Surgeon: Rosetta Posner, MD;  Location: [facility] ORS;  Service: Vascular;  Laterality: Right;  . BIOPSY  08/07/2016   Procedure: BIOPSY;  Surgeon: Daneil Dolin, MD;  Location: [facility] END[facility] SU[facility];  Service: Endoscopy;;  gastric ulcer biopsy  . COLONOSCOPY    . ESOPHAGOGASTRODUODENOSCOPY N/A 08/07/2016   Procedure: ESOPHAGOGASTRODUODENOSCOPY (EGD);  Surgeon: Daneil Dolin, MD;  Location[facility];  Service: Endoscopy;  Laterality: N/A;  1215-rescheduled to 6/1 @ 2:30pm per Tretha Sciara  . ESOPHAGOGASTRODUODENOSCOPY N/A 11/27/2016   Procedure: ESOPHAGOGASTRODUODENOSCOPY (EGD);  Surgeon: Daneil Dolin, MD;  [facility];  Service: Endoscopy;  Laterality:  N/A;  8:15am  . FISTULA SUPERFICIALIZATION Left 02/14/2018   Procedure: FISTULA SUPERFICIALIZATION LEFT ARM;  Surgeon: Angelia Mould, MD;  Location: [facility];  Service: Vascular;  Laterality: Left;  . FRACTURE SURGERY Right    ankle  . MALONEY DILATION N/A 08/07/2016   Procedure: Venia Minks DILATION;  Surgeon: Daneil Dolin, MD;  [facility];  Service: Endoscopy;  Laterality: N/A;  . MASTECTOMY, PARTIAL Right   . PERIPHERAL VASCULAR BALLOON ANGIOPLASTY Left  07/13/2019   Procedure: PERIPHERAL VASCULAR BALLOON ANGIOPLASTY;  Surgeon: Marty Heck, MD;  Location: [place] [facility];  Service: Cardiovascular;  Laterality: Left;  arm fistulogram  . PORT-A-CATH REMOVAL Left 11/22/2017   Procedure: REMOVAL PORT-A-CATH LEFT CHEST;  Surgeon: Elam Dutch, MD;  Location: [facility] OR;  Service: Vascular;  Laterality: Left;  . RETINAL DETACHMENT SURGERY Right     Social History   Socioeconomic History  . Marital status: Single    Spouse name: Not on file  . Number of children: Not on file  . Years of education: Not on file  . Highest education level: Not on file  Occupational History  . Occupation: retired   Tobacco Use  . Smoking status: Never Smoker  . Smokeless tobacco: Never Used  Vaping Use  . Vaping Use: Never used  Substance and Sexual Activity  . Alcohol use: No    Alcohol/week: 0.0 standard drinks  . Drug use: No  . Sexual activity: Never  Other Topics Concern  . Not on file  Social History Narrative   Long term resident of SNF    Social Determinants of Health   Financial Resource Strain: Low Risk   . Difficulty of Paying Living Expenses: Not very hard  Food Insecurity: No Food Insecurity  . Worried About Charity fundraiser in the Last Year: Never true  . Ran Out of Food in the Last Year: Never true  Transportation Needs: No Transportation Needs  . Lack of Transportation (Medical): No  . Lack of Transportation (Non-Medical): No  Physical Activity: Inactive  . Days of Exercise per Week: 0 days  . Minutes of Exercise per Session: 0 min  Stress: No Stress Concern Present  . Feeling of Stress : Not at all  Social Connections: Moderately Isolated  . Frequency of Communication with Friends and Family: More than three times a week  . Frequency of Social Gatherings with Friends and Family: Once a week  . Attends Religious Services: More than 4 times per year  . Active Member of Clubs or Organizations: No  . Attends Theatre manager Meetings: Never  . Marital Status: Never married  Intimate Partner Violence: Not At Risk  . Fear of Current or Ex-Partner: No  . Emotionally Abused: No  . Physically Abused: No  . Sexually Abused: No   Family History  Problem Relation Age of Onset  . Multiple myeloma Sister   . Brain cancer Sister   . Dementia Mother        died at 60  . Stroke Mother   . Heart failure Mother   . Diabetes Mother   . Heart disease Father   . Prostate cancer Brother   . Colon cancer Neg Hx       VITAL SIGNS BP 135/73   Pulse 81   Temp 98.1 F (36.7 C)   Resp 20   Ht '5\' 3"'  (1.6 m)   Wt 144 lb (65.3 kg)   BMI 25.51 kg/m  Outpatient Encounter Medications as of 04/25/2020  Medication Sig  . acetaminophen (TYLENOL) 650 MG CR tablet Take 650 mg by mouth every 6 (six) hours.  Marland Kitchen acyclovir (ZOVIRAX) 200 MG capsule Take 200 mg by mouth daily.  Marland Kitchen amLODipine (NORVASC) 10 MG tablet Take 10 mg by mouth daily. Not taking on dialysis days  . anastrozole (ARIMIDEX) 1 MG tablet TAKE 1 TABLET BY MOUTH DAILY  . aspirin EC 81 MG tablet Take 81 mg by mouth daily.  . calcium acetate (PHOSLO) 667 MG capsule Take 667-1,334 mg by mouth See admin instructions. Take 1,334 mg twice daily with meals and 667 mg once daily with snacks  . calcium-vitamin D (OSCAL WITH D) 500-200 MG-UNIT tablet Take 1 tablet by mouth daily. At 8 am  . cloNIDine (CATAPRES) 0.3 MG tablet Take 0.3 mg by mouth 2 (two) times daily.   . insulin aspart (NOVOLOG FLEXPEN) 100 UNIT/ML FlexPen Inject 10 Units into the skin 2 (two) times daily. 8 am and 5 pm, Hold for accu-check less than 150.  Marland Kitchen insulin aspart (NOVOLOG FLEXPEN) 100 UNIT/ML FlexPen Inject 15 Units into the skin daily at 12 noon. 12 pm,  Hold for accu-check less than 150  . insulin glargine (LANTUS SOLOSTAR) 100 UNIT/ML Solostar Pen Inject 10 Units into the skin at bedtime.  . melatonin 3 MG TABS tablet Take 6 mg by mouth at bedtime. For Sleep  . Multiple Vitamin  (MULTIVITAMIN) capsule Take 1 capsule by mouth daily.  . NON FORMULARY Diet: _____ Regular, ___x___ NAS, _______Consistent Carbohydrate, _______NPO __x___Other Low Potassium  . NON FORMULARY Wanderguard #3310 to ankle for safety awareness. Check placement and function qshift. Every Shift Day, Evening, Night  . Nutritional Supplements (NEPRO PO) Take 1 Can by mouth at bedtime.   No facility-administered encounter medications on file as of 04/25/2020.     SIGNIFICANT DIAGNOSTIC EXAMS  LABS REVIEWED PREVIOUS  10-13-19: hgb a1c 6.0; vit B 12: 1126 folate 20.9 10-17-19: wbc 8.8; hgb 8.3; hct 26.1; mcv 114.0 plt 270; glucose 190; bun 28; creat 4.85; k+ 3.9; na++ 127; ca 8.9 liver normal albumin 4.0  10-31-19: wbc 6.0; hgb 8.8; hct 28.6; mcv 117.2 plt 326; glucose 188; bun 45; creat 6.28; k+ 3.9; na++ 137;ca 9.1 liver normal albumin 3.9 11-07-19: wbc 6.1; hgb 9.2; hct 29.4 mcv 115.7 plt 276; glucose 201; bun 44; creat 5.93; k+ 4.5; na++ 136; ca 9.1 liver normal albumin 3.8 11-14-19; wbc 8.3; hgb 9.7; hct 31.2 mcv 116.4 plt 261; glucose 198; bun 44; creat 6.66; k+ 5.1; na++ 138; ca 8.8 liver normal albumin 3.9   12-12-19: wbc 7.6; hgb 11.3; hct 35.6; mcv 109.9 plt 212; glucose 602; bun 67; creat 8.62; k+ 5.6; na++ 135; ca 7.9 liver normal albumin 3.5  12-20-19: hgb a1c 8.0  01-09-20: wbc 6.9; hgb 12.1; hct 39.7; mcv 105.6 plt 299; glucose 225; bun 112; creat 14.53; k+ 6.4; na++ 132; ca 8.3 liver normal albumin 3.5  01-23-20: k+ 6.6  01-23-20: wbc 7.9; hgb 11.6; hct 36.9; mcv 103.7 plt 359; glucose 139; bun 109; creat 15.46; k+ 6.1; na++ 140; ca 8.7 liver normal albumin 4.1;  01-24-20: k+ 4.4  01-30-20: wbc 10.6; hgb 9.7; hct 32.0; mcv 103.6 plt 390; glucose 164; bun 44; creat 9.08; k+ 4.1; na++ 139; ca 9.0 liver normal albumin 3.3 02-06-20: wbc 7.8; hgb 9.9; hct 32.7 mcv 104.8 plt 344; glucose 211; bun 90; creat 10.97; k+ 6.0 na++ 137; ca 8.8 liver normal albumin 3.4  03-26-20: wbc  6.3; hgb 9.4; hct  30.4; mcv 104.1; plt 300; glucose 223; bun 101; creat 14.22; k+ 5.8; na++ 135; ca 8.1 GFR 2; liver normal albumin 3.4   NO NEW LABS.   Review of Systems  Constitutional: Negative for malaise/fatigue.  Respiratory: Negative for cough and shortness of breath.   Cardiovascular: Negative for chest pain, palpitations and leg swelling.  Gastrointestinal: Negative for abdominal pain, constipation and heartburn.  Musculoskeletal: Negative for back pain, joint pain and myalgias.  Skin: Negative.   Neurological: Negative for dizziness.  Psychiatric/Behavioral: The patient is not nervous/anxious.    Physical Exam Constitutional:      General: She is not in acute distress.    Appearance: She is well-developed and well-nourished. She is not diaphoretic.  Neck:     Thyroid: No thyromegaly.  Cardiovascular:     Rate and Rhythm: Normal rate and regular rhythm.     Pulses: Normal pulses and intact distal pulses.     Heart sounds: Normal heart sounds.  Pulmonary:     Effort: Pulmonary effort is normal. No respiratory distress.     Breath sounds: Normal breath sounds.  Abdominal:     General: Bowel sounds are normal. There is no distension.     Palpations: Abdomen is soft.     Tenderness: There is no abdominal tenderness.  Musculoskeletal:        General: No edema. Normal range of motion.     Cervical back: Neck supple.     Right lower leg: No edema.     Left lower leg: No edema.  Lymphadenopathy:     Cervical: No cervical adenopathy.  Skin:    General: Skin is warm and dry.     Comments: Right arm a/v fistula: + thrill +bruit   Neurological:     Mental Status: She is alert. Mental status is at baseline.  Psychiatric:        Mood and Affect: Mood and affect and mood normal.     ASSESSMENT/ PLAN:  TODAY  1. Aortic atherosclerosis 2.  Vascular dementia with behavioral disturbance 3. Anemia due to end stage renal disease  Will continue current medications Will continue current plan  of care Will continue to monitor her status.    MD is aware of resident's narcotic use and is in agreement with current plan of care. We will attempt to wean resident as appropriate.  Ok Edwards NP [facility]  Contact [phone] Monday through Friday 8am- 5pm  After hours call [phone]

## 2020-04-26 ENCOUNTER — Encounter: Payer: Self-pay | Admitting: Adult Health

## 2020-04-26 ENCOUNTER — Non-Acute Institutional Stay (SKILLED_NURSING_FACILITY): Payer: Medicare PPO | Admitting: Adult Health

## 2020-04-26 DIAGNOSIS — N186 End stage renal disease: Secondary | ICD-10-CM | POA: Diagnosis not present

## 2020-04-26 DIAGNOSIS — C50911 Malignant neoplasm of unspecified site of right female breast: Secondary | ICD-10-CM

## 2020-04-26 DIAGNOSIS — C9000 Multiple myeloma not having achieved remission: Secondary | ICD-10-CM

## 2020-04-26 DIAGNOSIS — E1122 Type 2 diabetes mellitus with diabetic chronic kidney disease: Secondary | ICD-10-CM

## 2020-04-26 DIAGNOSIS — Z992 Dependence on renal dialysis: Secondary | ICD-10-CM | POA: Diagnosis not present

## 2020-04-26 DIAGNOSIS — I120 Hypertensive chronic kidney disease with stage 5 chronic kidney disease or end stage renal disease: Secondary | ICD-10-CM | POA: Diagnosis not present

## 2020-04-26 NOTE — Progress Notes (Signed)
Location:  [facility] Room Number: 148-D Place of Service:  SNF (31)   CODE STATUS: Full Code   Allergies  Allergen Reactions  . Penicillins Other (See Comments)    Unsure of reaction Has patient had a PCN reaction causing immediate rash, facial/tongue/throat swelling, SOB or lightheadedness with hypotension: Unknown Has patient had a PCN reaction causing severe rash involving mucus membranes or skin necrosis: Unknown Has patient had a PCN reaction that required hospitalization: No Has patient had a PCN reaction occurring within the last 10 years: Unknown If all of the above answers are "NO", then may proceed with Cephalosporin use.    . Ace Inhibitors Cough and Other (See Comments)    Tongue swell  . Lisinopril Other (See Comments)    Tongue swell    Chief Complaint  Patient presents with  . Medical Management of Chronic Issues          Hypertension due to end stage renal disaease due to type 2 diabetes mellitus:. Stage 1 infiltrating ductal carcinoma of right female breast  multiple myeloma not achieving remission:    HPI:  She is a 85 year old long term resident of this facility being seen for the managemnet of her chronic illnesses:  Hypertension due to end stage renal disaease due to type 2 diabetes mellitus:. Stage 1 infiltrating ductal carcinoma of right female breast  multiple myeloma not achieving remission:. There are no reports of uncontrolled pain; no changes in appetite; weight is stable. There are no reports of anxiety or agitation.    Past Medical History:  Diagnosis Date  . Anemia   . Anxiety   . Chronic kidney disease   . Chronic renal disease, stage 4, severely decreased glomerular filtration rate (GFR) between 15-29 mL/min/1.73 square meter (HCC) 08/22/2015  . Complication of anesthesia    delirious after Breast Surgery  . Dementia ([place])    mild  . Depression   . Diabetes mellitus without complication ([place])    type II  . Dyspnea     with activity  . GERD (gastroesophageal reflux disease)   . Glaucoma   . Hypertension   . Pneumonia   . Stage 1 infiltrating ductal carcinoma of right female breast ([place]) 08/21/2015   ER+ PR+ HER 2 neu + (3+) T1cN0     Past Surgical History:  Procedure Laterality Date  . AV FISTULA PLACEMENT Left 11/22/2017   Procedure: ARTERIOVENOUS (AV) FISTULA CREATION LEFT ARM;  Surgeon: Elam Dutch, MD;  Location: [facility] OR;  Service: Vascular;  Laterality: Left;  . AV FISTULA PLACEMENT Right 04/04/2020   Procedure: RIGHT ARM ARTERIOVENOUS FISTULA CREATION;  Surgeon: Rosetta Posner, MD;  Location: [facility] ORS;  Service: Vascular;  Laterality: Right;  . BIOPSY  08/07/2016   Procedure: BIOPSY;  Surgeon: Daneil Dolin, MD;  Location[facility] SU[facility];  Service: Endoscopy;;  gastric ulcer biopsy  . COLONOSCOPY    . ESOPHAGOGASTRODUODENOSCOPY N/A 08/07/2016   Procedure: ESOPHAGOGASTRODUODENOSCOPY (EGD);  Surgeon: Daneil Dolin, MD;  [facility];  Service: Endoscopy;  Laterality: N/A;  1215-rescheduled to 6/1 @ 2:30pm per Tretha Sciara  . ESOPHAGOGASTRODUODENOSCOPY N/A 11/27/2016   Procedure: ESOPHAGOGASTRODUODENOSCOPY (EGD);  Surgeon: Daneil Dolin, MD;  [facility];  Service: Endoscopy;  Laterality: N/A;  8:15am  . FISTULA SUPERFICIALIZATION Left 02/14/2018   Procedure: FISTULA SUPERFICIALIZATION LEFT ARM;  Surgeon: Angelia Mould, MD;  Location: [facility];  Service: Vascular;  Laterality: Left;  . FRACTURE SURGERY  Right    ankle  . MALONEY DILATION N/A 08/07/2016   Procedure: Venia Minks DILATION;  Surgeon: Daneil Dolin, MD;  [facility];  Service: Endoscopy;  Laterality: N/A;  . MASTECTOMY, PARTIAL Right   . PERIPHERAL VASCULAR BALLOON ANGIOPLASTY Left 07/13/2019   Procedure: PERIPHERAL VASCULAR BALLOON ANGIOPLASTY;  Surgeon: Marty Heck, MD;  Location: [facility];  Service: Cardiovascular;  Laterality: Left;  arm fistulogram  . PORT-A-CATH REMOVAL Left  11/22/2017   Procedure: REMOVAL PORT-A-CATH LEFT CHEST;  Surgeon: Elam Dutch, MD;  Location: [facility] OR;  Service: Vascular;  Laterality: Left;  . RETINAL DETACHMENT SURGERY Right     Social History   Socioeconomic History  . Marital status: Single    Spouse name: Not on file  . Number of children: Not on file  . Years of education: Not on file  . Highest education level: Not on file  Occupational History  . Occupation: retired   Tobacco Use  . Smoking status: Never Smoker  . Smokeless tobacco: Never Used  Vaping Use  . Vaping Use: Never used  Substance and Sexual Activity  . Alcohol use: No    Alcohol/week: 0.0 standard drinks  . Drug use: No  . Sexual activity: Never  Other Topics Concern  . Not on file  Social History Narrative   Long term resident of SNF    Social Determinants of Health   Financial Resource Strain: Low Risk   . Difficulty of Paying Living Expenses: Not very hard  Food Insecurity: No Food Insecurity  . Worried About Charity fundraiser in the Last Year: Never true  . Ran Out of Food in the Last Year: Never true  Transportation Needs: No Transportation Needs  . Lack of Transportation (Medical): No  . Lack of Transportation (Non-Medical): No  Physical Activity: Inactive  . Days of Exercise per Week: 0 days  . Minutes of Exercise per Session: 0 min  Stress: No Stress Concern Present  . Feeling of Stress : Not at all  Social Connections: Moderately Isolated  . Frequency of Communication with Friends and Family: More than three times a week  . Frequency of Social Gatherings with Friends and Family: Once a week  . Attends Religious Services: More than 4 times per year  . Active Member of Clubs or Organizations: No  . Attends Archivist Meetings: Never  . Marital Status: Never married  Intimate Partner Violence: Not At Risk  . Fear of Current or Ex-Partner: No  . Emotionally Abused: No  . Physically Abused: No  . Sexually Abused: No    Family History  Problem Relation Age of Onset  . Multiple myeloma Sister   . Brain cancer Sister   . Dementia Mother        died at 27  . Stroke Mother   . Heart failure Mother   . Diabetes Mother   . Heart disease Father   . Prostate cancer Brother   . Colon cancer Neg Hx       VITAL SIGNS BP 135/73   Pulse 81   Temp 98.1 F (36.7 C)   Ht _0  (1.6 m)   Wt 144 lb (65.3 kg)   SpO2 98%   BMI 25.51 kg/m   Outpatient Encounter Medications as of 04/26/2020  Medication Sig  . acetaminophen (TYLENOL) 650 MG CR tablet Take 650 mg by mouth every 6 (six) hours.  Marland Kitchen acyclovir (ZOVIRAX) 200 MG capsule Take 200  mg by mouth daily.  Marland Kitchen amLODipine (NORVASC) 10 MG tablet Take 10 mg by mouth daily. Not taking on dialysis days  . anastrozole (ARIMIDEX) 1 MG tablet TAKE 1 TABLET BY MOUTH DAILY  . aspirin EC 81 MG tablet Take 81 mg by mouth daily.  . calcium acetate (PHOSLO) 667 MG capsule Take 667-1,334 mg by mouth See admin instructions. Take 1,334 mg twice daily with meals and 667 mg once daily with snacks  . calcium-vitamin D (OSCAL WITH D) 500-200 MG-UNIT tablet Take 1 tablet by mouth daily. At 8 am  . cloNIDine (CATAPRES) 0.3 MG tablet Take 0.3 mg by mouth 2 (two) times daily.   . insulin aspart (NOVOLOG FLEXPEN) 100 UNIT/ML FlexPen Inject 10 Units into the skin 2 (two) times daily. 8 am and 5 pm, Hold for accu-check less than 150.  Marland Kitchen insulin aspart (NOVOLOG FLEXPEN) 100 UNIT/ML FlexPen Inject 15 Units into the skin daily at 12 noon. 12 pm,  Hold for accu-check less than 150  . insulin glargine (LANTUS SOLOSTAR) 100 UNIT/ML Solostar Pen Inject 10 Units into the skin at bedtime.  . melatonin 3 MG TABS tablet Take 6 mg by mouth at bedtime. For Sleep  . Multiple Vitamin (MULTIVITAMIN) capsule Take 1 capsule by mouth daily.  . NON FORMULARY Diet: _____ Regular, ___x___ NAS, _______Consistent Carbohydrate, _______NPO __x___Other Low Potassium  . NON FORMULARY Wanderguard #3310 to ankle  for safety awareness. Check placement and function qshift. Every Shift Day, Evening, Night  . Nutritional Supplements (NEPRO PO) Take 1 Can by mouth at bedtime.   No facility-administered encounter medications on file as of 04/26/2020.     SIGNIFICANT DIAGNOSTIC EXAMS  LABS REVIEWED PREVIOUS  10-13-19: hgb a1c 6.0; vit B 12: 1126 folate 20.9 10-17-19: wbc 8.8; hgb 8.3; hct 26.1; mcv 114.0 plt 270; glucose 190; bun 28; creat 4.85; k+ 3.9; na++ 127; ca 8.9 liver normal albumin 4.0  10-31-19: wbc 6.0; hgb 8.8; hct 28.6; mcv 117.2 plt 326; glucose 188; bun 45; creat 6.28; k+ 3.9; na++ 137;ca 9.1 liver normal albumin 3.9 11-07-19: wbc 6.1; hgb 9.2; hct 29.4 mcv 115.7 plt 276; glucose 201; bun 44; creat 5.93; k+ 4.5; na++ 136; ca 9.1 liver normal albumin 3.8 11-14-19; wbc 8.3; hgb 9.7; hct 31.2 mcv 116.4 plt 261; glucose 198; bun 44; creat 6.66; k+ 5.1; na++ 138; ca 8.8 liver normal albumin 3.9   12-12-19: wbc 7.6; hgb 11.3; hct 35.6; mcv 109.9 plt 212; glucose 602; bun 67; creat 8.62; k+ 5.6; na++ 135; ca 7.9 liver normal albumin 3.5  12-20-19: hgb a1c 8.0  01-09-20: wbc 6.9; hgb 12.1; hct 39.7; mcv 105.6 plt 299; glucose 225; bun 112; creat 14.53; k+ 6.4; na++ 132; ca 8.3 liver normal albumin 3.5  01-23-20: k+ 6.6  01-23-20: wbc 7.9; hgb 11.6; hct 36.9; mcv 103.7 plt 359; glucose 139; bun 109; creat 15.46; k+ 6.1; na++ 140; ca 8.7 liver normal albumin 4.1;  01-24-20: k+ 4.4  01-30-20: wbc 10.6; hgb 9.7; hct 32.0; mcv 103.6 plt 390; glucose 164; bun 44; creat 9.08; k+ 4.1; na++ 139; ca 9.0 liver normal albumin 3.3 02-06-20: wbc 7.8; hgb 9.9; hct 32.7 mcv 104.8 plt 344; glucose 211; bun 90; creat 10.97; k+ 6.0 na++ 137; ca 8.8 liver normal albumin 3.4  03-26-20: wbc 6.3; hgb 9.4; hct 30.4; mcv 104.1; plt 300; glucose 223; bun 101; creat 14.22; k+ 5.8; na++ 135; ca 8.1 GFR 2; liver normal albumin 3.4   NO NEW LABS.  Review of Systems  Constitutional: Negative for malaise/fatigue.  Respiratory:  Negative for cough and shortness of breath.   Cardiovascular: Negative for chest pain, palpitations and leg swelling.  Gastrointestinal: Negative for abdominal pain, constipation and heartburn.  Musculoskeletal: Negative for back pain, joint pain and myalgias.  Skin: Negative.   Neurological: Negative for dizziness.  Psychiatric/Behavioral: The patient is not nervous/anxious.     Physical Exam Constitutional:      General: She is not in acute distress.    Appearance: She is well-developed and well-nourished. She is not diaphoretic.  Neck:     Thyroid: No thyromegaly.  Cardiovascular:     Rate and Rhythm: Normal rate and regular rhythm.     Pulses: Normal pulses and intact distal pulses.     Heart sounds: Normal heart sounds.  Pulmonary:     Effort: Pulmonary effort is normal. No respiratory distress.     Breath sounds: Normal breath sounds.  Abdominal:     General: Bowel sounds are normal. There is no distension.     Palpations: Abdomen is soft.     Tenderness: There is no abdominal tenderness.  Musculoskeletal:        General: No edema. Normal range of motion.     Cervical back: Neck supple.     Right lower leg: No edema.     Left lower leg: No edema.  Lymphadenopathy:     Cervical: No cervical adenopathy.  Skin:    General: Skin is warm and dry.     Comments: Right arm with ace wrap   Neurological:     Mental Status: She is alert. Mental status is at baseline.  Psychiatric:        Mood and Affect: Mood and affect and mood normal.     ASSESSMENT/ PLAN:  TODAY  1. Hypertension due to end stage renal disease de to type 2 diabetes mellitus: is stable b/p 135/73 will continue norvasc 10 mg four times weekly and clonidine 0.3 mg twice daily asa 81 mg daily   2. Stage 1 infiltrating ductal carcinoma of right female breast Is stable is followed by oncology will continue armidex 1 mg daily   3. multiple myeloma not achieving remission: is without change is followed by  oncology.    PREVIOUS   4. Anemia due to end stage renal disease: is stable hgb 9.4 will monitor   5. Herpes: no reports of outbreaks; will continue acyclovir 200 mg daily   6. Vascular dementia with behavioral disturbance: is without change weight is 144 pounds will monitor   7. Diabetes with end stage renal disease (ESRD) is stable hgb a1c 8.0 will continue novolog 10 units with breakfast and supper; 15 units at lunch basaglar 10 units nightly   8. Aortic atherosclerosis/atherosclerotic peripheral vascular disease: is stable (10-04-18 scan) will monitor   9. Hyperkalemia: k+ 5.8 is without change will continue low potassium diet  10 Chronic kidney disease with end stage renal disease on dialysis due to type 2 diabetes mellitus/dependence on dialysis: will continue dialysis three days weekly; will continue phoshyra 667 mg 2 tabs twice daily and 1 with snacks.     MD is aware of resident's narcotic use and is in agreement with current plan of care. We will attempt to wean resident as appropriate.  Ok Edwards NP [facility]  Contact [phone] Monday through Friday 8am- 5pm  After hours call [phone]

## 2020-04-29 DIAGNOSIS — Z992 Dependence on renal dialysis: Secondary | ICD-10-CM | POA: Diagnosis not present

## 2020-04-29 DIAGNOSIS — N186 End stage renal disease: Secondary | ICD-10-CM | POA: Diagnosis not present

## 2020-04-30 ENCOUNTER — Inpatient Hospital Stay (HOSPITAL_COMMUNITY): Payer: Medicare PPO

## 2020-04-30 ENCOUNTER — Other Ambulatory Visit: Payer: Self-pay

## 2020-04-30 VITALS — BP 103/42 | HR 78 | Temp 96.8°F | Resp 18 | Wt 142.2 lb

## 2020-04-30 DIAGNOSIS — C9000 Multiple myeloma not having achieved remission: Secondary | ICD-10-CM

## 2020-04-30 DIAGNOSIS — Z5111 Encounter for antineoplastic chemotherapy: Secondary | ICD-10-CM | POA: Diagnosis not present

## 2020-04-30 LAB — CBC WITH DIFFERENTIAL/PLATELET
Abs Immature Granulocytes: 0.02 10*3/uL (ref 0.00–0.07)
Basophils Absolute: 0 10*3/uL (ref 0.0–0.1)
Basophils Relative: 1 %
Eosinophils Absolute: 0.1 10*3/uL (ref 0.0–0.5)
Eosinophils Relative: 2 %
HCT: 34.6 % — ABNORMAL LOW (ref 36.0–46.0)
Hemoglobin: 10.6 g/dL — ABNORMAL LOW (ref 12.0–15.0)
Immature Granulocytes: 0 %
Lymphocytes Relative: 18 %
Lymphs Abs: 1.2 10*3/uL (ref 0.7–4.0)
MCH: 33 pg (ref 26.0–34.0)
MCHC: 30.6 g/dL (ref 30.0–36.0)
MCV: 107.8 fL — ABNORMAL HIGH (ref 80.0–100.0)
Monocytes Absolute: 0.7 10*3/uL (ref 0.1–1.0)
Monocytes Relative: 10 %
Neutro Abs: 4.5 10*3/uL (ref 1.7–7.7)
Neutrophils Relative %: 69 %
Platelets: 244 10*3/uL (ref 150–400)
RBC: 3.21 MIL/uL — ABNORMAL LOW (ref 3.87–5.11)
RDW: 16.1 % — ABNORMAL HIGH (ref 11.5–15.5)
WBC: 6.6 10*3/uL (ref 4.0–10.5)
nRBC: 0 % (ref 0.0–0.2)

## 2020-04-30 LAB — COMPREHENSIVE METABOLIC PANEL
ALT: 14 U/L (ref 0–44)
AST: 20 U/L (ref 15–41)
Albumin: 3.7 g/dL (ref 3.5–5.0)
Alkaline Phosphatase: 77 U/L (ref 38–126)
Anion gap: 14 (ref 5–15)
BUN: 38 mg/dL — ABNORMAL HIGH (ref 8–23)
CO2: 28 mmol/L (ref 22–32)
Calcium: 8.4 mg/dL — ABNORMAL LOW (ref 8.9–10.3)
Chloride: 91 mmol/L — ABNORMAL LOW (ref 98–111)
Creatinine, Ser: 7.07 mg/dL — ABNORMAL HIGH (ref 0.44–1.00)
GFR, Estimated: 5 mL/min — ABNORMAL LOW (ref >60)
Glucose, Bld: 254 mg/dL — ABNORMAL HIGH (ref 70–99)
Potassium: 4.6 mmol/L (ref 3.5–5.1)
Sodium: 133 mmol/L — ABNORMAL LOW (ref 135–145)
Total Bilirubin: 0.5 mg/dL (ref 0.3–1.2)
Total Protein: 6.7 g/dL (ref 6.5–8.1)

## 2020-04-30 MED ORDER — PROCHLORPERAZINE MALEATE 10 MG PO TABS
10.0000 mg | ORAL_TABLET | Freq: Once | ORAL | Status: AC
  Administered 2020-04-30: 10 mg via ORAL
  Filled 2020-04-30: qty 1

## 2020-04-30 MED ORDER — BORTEZOMIB CHEMO SQ INJECTION 3.5 MG (2.5MG/ML)
1.3000 mg/m2 | Freq: Once | INTRAMUSCULAR | Status: AC
  Administered 2020-04-30: 2.25 mg via SUBCUTANEOUS
  Filled 2020-04-30: qty 0.9

## 2020-04-30 MED ORDER — DEXAMETHASONE 4 MG PO TABS
10.0000 mg | ORAL_TABLET | Freq: Once | ORAL | Status: AC
  Administered 2020-04-30: 10 mg via ORAL
  Filled 2020-04-30: qty 3

## 2020-04-30 MED ORDER — METHYLPREDNISOLONE SODIUM SUCC 125 MG IJ SOLR
INTRAMUSCULAR | Status: AC
  Filled 2020-04-30: qty 2

## 2020-04-30 MED ORDER — PROCHLORPERAZINE MALEATE 10 MG PO TABS
ORAL_TABLET | ORAL | Status: AC
  Filled 2020-04-30: qty 1

## 2020-04-30 NOTE — Progress Notes (Signed)
Ok to proceed with labs patient on dialysis.  Henreitta Leber, PharmD

## 2020-04-30 NOTE — Progress Notes (Signed)
Patient tolerated Velcade injection with no complaints voiced.  Lab work reviewed.  See MAR for details.  Injection site clean and dry with no bruising or swelling noted.  Band aid applied.  VSS.  Patient left in satisfactory condition with no s/s of distress noted.  

## 2020-05-01 DIAGNOSIS — Z992 Dependence on renal dialysis: Secondary | ICD-10-CM | POA: Diagnosis not present

## 2020-05-01 DIAGNOSIS — N186 End stage renal disease: Secondary | ICD-10-CM | POA: Diagnosis not present

## 2020-05-01 MED ORDER — OCTREOTIDE ACETATE 30 MG IM KIT
PACK | INTRAMUSCULAR | Status: AC
  Filled 2020-05-01: qty 1

## 2020-05-01 MED ORDER — ACETAMINOPHEN 325 MG PO TABS
ORAL_TABLET | ORAL | Status: AC
  Filled 2020-05-01: qty 2

## 2020-05-01 MED ORDER — FAMOTIDINE 20 MG PO TABS
ORAL_TABLET | ORAL | Status: AC
  Filled 2020-05-01: qty 1

## 2020-05-01 MED ORDER — LORATADINE 10 MG PO TABS
ORAL_TABLET | ORAL | Status: AC
  Filled 2020-05-01: qty 1

## 2020-05-02 ENCOUNTER — Ambulatory Visit: Payer: Medicare PPO

## 2020-05-03 DIAGNOSIS — N186 End stage renal disease: Secondary | ICD-10-CM | POA: Diagnosis not present

## 2020-05-03 DIAGNOSIS — Z992 Dependence on renal dialysis: Secondary | ICD-10-CM | POA: Diagnosis not present

## 2020-05-06 ENCOUNTER — Encounter: Payer: Medicare PPO | Admitting: Vascular Surgery

## 2020-05-06 DIAGNOSIS — Z992 Dependence on renal dialysis: Secondary | ICD-10-CM | POA: Diagnosis not present

## 2020-05-06 DIAGNOSIS — N186 End stage renal disease: Secondary | ICD-10-CM | POA: Diagnosis not present

## 2020-05-07 ENCOUNTER — Telehealth: Payer: Self-pay

## 2020-05-07 NOTE — Telephone Encounter (Signed)
Belinda from [facility] called to let us know pt's RUE swelling has not improved and is looking worse since yesterday. Per APP, pt is to be scheduled for a venogram. We will schedule pt for procedure.

## 2020-05-08 DIAGNOSIS — H25812 Combined forms of age-related cataract, left eye: Secondary | ICD-10-CM | POA: Diagnosis not present

## 2020-05-08 DIAGNOSIS — H31001 Unspecified chorioretinal scars, right eye: Secondary | ICD-10-CM | POA: Diagnosis not present

## 2020-05-08 DIAGNOSIS — Z992 Dependence on renal dialysis: Secondary | ICD-10-CM | POA: Diagnosis not present

## 2020-05-08 DIAGNOSIS — H26491 Other secondary cataract, right eye: Secondary | ICD-10-CM | POA: Diagnosis not present

## 2020-05-08 DIAGNOSIS — N186 End stage renal disease: Secondary | ICD-10-CM | POA: Diagnosis not present

## 2020-05-08 DIAGNOSIS — Z961 Presence of intraocular lens: Secondary | ICD-10-CM | POA: Diagnosis not present

## 2020-05-10 DIAGNOSIS — N186 End stage renal disease: Secondary | ICD-10-CM | POA: Diagnosis not present

## 2020-05-10 DIAGNOSIS — C9000 Multiple myeloma not having achieved remission: Secondary | ICD-10-CM | POA: Diagnosis not present

## 2020-05-10 DIAGNOSIS — Z992 Dependence on renal dialysis: Secondary | ICD-10-CM | POA: Diagnosis not present

## 2020-05-10 DIAGNOSIS — M6281 Muscle weakness (generalized): Secondary | ICD-10-CM | POA: Diagnosis not present

## 2020-05-10 DIAGNOSIS — R1310 Dysphagia, unspecified: Secondary | ICD-10-CM | POA: Diagnosis not present

## 2020-05-11 DIAGNOSIS — N186 End stage renal disease: Secondary | ICD-10-CM | POA: Diagnosis not present

## 2020-05-11 DIAGNOSIS — C9000 Multiple myeloma not having achieved remission: Secondary | ICD-10-CM | POA: Diagnosis not present

## 2020-05-11 DIAGNOSIS — M6281 Muscle weakness (generalized): Secondary | ICD-10-CM | POA: Diagnosis not present

## 2020-05-11 DIAGNOSIS — R1310 Dysphagia, unspecified: Secondary | ICD-10-CM | POA: Diagnosis not present

## 2020-05-13 DIAGNOSIS — C9000 Multiple myeloma not having achieved remission: Secondary | ICD-10-CM | POA: Diagnosis not present

## 2020-05-13 DIAGNOSIS — Z992 Dependence on renal dialysis: Secondary | ICD-10-CM | POA: Diagnosis not present

## 2020-05-13 DIAGNOSIS — N186 End stage renal disease: Secondary | ICD-10-CM | POA: Diagnosis not present

## 2020-05-13 DIAGNOSIS — R1310 Dysphagia, unspecified: Secondary | ICD-10-CM | POA: Diagnosis not present

## 2020-05-13 DIAGNOSIS — M6281 Muscle weakness (generalized): Secondary | ICD-10-CM | POA: Diagnosis not present

## 2020-05-14 ENCOUNTER — Inpatient Hospital Stay (HOSPITAL_COMMUNITY): Payer: Medicare PPO | Attending: Hematology

## 2020-05-14 ENCOUNTER — Other Ambulatory Visit: Payer: Self-pay

## 2020-05-14 ENCOUNTER — Inpatient Hospital Stay (HOSPITAL_COMMUNITY): Payer: Medicare PPO

## 2020-05-14 DIAGNOSIS — C9000 Multiple myeloma not having achieved remission: Secondary | ICD-10-CM | POA: Diagnosis not present

## 2020-05-14 DIAGNOSIS — N186 End stage renal disease: Secondary | ICD-10-CM | POA: Diagnosis not present

## 2020-05-14 DIAGNOSIS — C50911 Malignant neoplasm of unspecified site of right female breast: Secondary | ICD-10-CM | POA: Insufficient documentation

## 2020-05-14 DIAGNOSIS — Z79810 Long term (current) use of selective estrogen receptor modulators (SERMs): Secondary | ICD-10-CM | POA: Insufficient documentation

## 2020-05-14 DIAGNOSIS — M6281 Muscle weakness (generalized): Secondary | ICD-10-CM | POA: Diagnosis not present

## 2020-05-14 DIAGNOSIS — M8580 Other specified disorders of bone density and structure, unspecified site: Secondary | ICD-10-CM | POA: Insufficient documentation

## 2020-05-14 DIAGNOSIS — E119 Type 2 diabetes mellitus without complications: Secondary | ICD-10-CM | POA: Insufficient documentation

## 2020-05-14 DIAGNOSIS — Z992 Dependence on renal dialysis: Secondary | ICD-10-CM | POA: Insufficient documentation

## 2020-05-14 DIAGNOSIS — R1310 Dysphagia, unspecified: Secondary | ICD-10-CM | POA: Diagnosis not present

## 2020-05-14 DIAGNOSIS — F0390 Unspecified dementia without behavioral disturbance: Secondary | ICD-10-CM | POA: Insufficient documentation

## 2020-05-14 DIAGNOSIS — Z5111 Encounter for antineoplastic chemotherapy: Secondary | ICD-10-CM | POA: Insufficient documentation

## 2020-05-14 DIAGNOSIS — Z9011 Acquired absence of right breast and nipple: Secondary | ICD-10-CM | POA: Insufficient documentation

## 2020-05-14 MED ORDER — SODIUM CHLORIDE 0.9% FLUSH: 3.0000 mL | INTRAVENOUS | Status: DC | PRN

## 2020-05-14 MED ORDER — SODIUM CHLORIDE 0.9 % IV SOLN: 250.0000 mL | INTRAVENOUS | Status: DC | PRN

## 2020-05-14 MED ORDER — SODIUM CHLORIDE 0.9% FLUSH: 3.0000 mL | Freq: Two times a day (BID) | INTRAVENOUS | Status: DC

## 2020-05-15 DIAGNOSIS — N186 End stage renal disease: Secondary | ICD-10-CM | POA: Diagnosis not present

## 2020-05-15 DIAGNOSIS — Z992 Dependence on renal dialysis: Secondary | ICD-10-CM | POA: Diagnosis not present

## 2020-05-17 DIAGNOSIS — N186 End stage renal disease: Secondary | ICD-10-CM | POA: Diagnosis not present

## 2020-05-17 DIAGNOSIS — Z992 Dependence on renal dialysis: Secondary | ICD-10-CM | POA: Diagnosis not present

## 2020-05-20 DIAGNOSIS — N186 End stage renal disease: Secondary | ICD-10-CM | POA: Diagnosis not present

## 2020-05-20 DIAGNOSIS — Z992 Dependence on renal dialysis: Secondary | ICD-10-CM | POA: Diagnosis not present

## 2020-05-21 ENCOUNTER — Encounter (HOSPITAL_COMMUNITY): Payer: Medicare PPO

## 2020-05-21 ENCOUNTER — Inpatient Hospital Stay (HOSPITAL_COMMUNITY): Payer: Medicare PPO

## 2020-05-21 ENCOUNTER — Encounter (HOSPITAL_COMMUNITY): Payer: Self-pay

## 2020-05-21 VITALS — BP 88/43 | HR 73 | Temp 96.9°F | Resp 18

## 2020-05-21 DIAGNOSIS — N186 End stage renal disease: Secondary | ICD-10-CM | POA: Diagnosis not present

## 2020-05-21 DIAGNOSIS — C9000 Multiple myeloma not having achieved remission: Secondary | ICD-10-CM | POA: Diagnosis not present

## 2020-05-21 DIAGNOSIS — Z9011 Acquired absence of right breast and nipple: Secondary | ICD-10-CM | POA: Diagnosis not present

## 2020-05-21 DIAGNOSIS — C50911 Malignant neoplasm of unspecified site of right female breast: Secondary | ICD-10-CM | POA: Diagnosis not present

## 2020-05-21 DIAGNOSIS — Z5111 Encounter for antineoplastic chemotherapy: Secondary | ICD-10-CM | POA: Diagnosis not present

## 2020-05-21 DIAGNOSIS — Z79810 Long term (current) use of selective estrogen receptor modulators (SERMs): Secondary | ICD-10-CM | POA: Diagnosis not present

## 2020-05-21 DIAGNOSIS — F0390 Unspecified dementia without behavioral disturbance: Secondary | ICD-10-CM | POA: Diagnosis not present

## 2020-05-21 DIAGNOSIS — Z992 Dependence on renal dialysis: Secondary | ICD-10-CM | POA: Diagnosis not present

## 2020-05-21 DIAGNOSIS — E119 Type 2 diabetes mellitus without complications: Secondary | ICD-10-CM | POA: Diagnosis not present

## 2020-05-21 DIAGNOSIS — M8580 Other specified disorders of bone density and structure, unspecified site: Secondary | ICD-10-CM | POA: Diagnosis not present

## 2020-05-21 LAB — COMPREHENSIVE METABOLIC PANEL
ALT: 12 U/L (ref 0–44)
AST: 20 U/L (ref 15–41)
Albumin: 3.7 g/dL (ref 3.5–5.0)
Alkaline Phosphatase: 74 U/L (ref 38–126)
Anion gap: 17 — ABNORMAL HIGH (ref 5–15)
BUN: 41 mg/dL — ABNORMAL HIGH (ref 8–23)
CO2: 25 mmol/L (ref 22–32)
Calcium: 8.4 mg/dL — ABNORMAL LOW (ref 8.9–10.3)
Chloride: 90 mmol/L — ABNORMAL LOW (ref 98–111)
Creatinine, Ser: 7.54 mg/dL — ABNORMAL HIGH (ref 0.44–1.00)
GFR, Estimated: 5 mL/min — ABNORMAL LOW (ref >60)
Glucose, Bld: 186 mg/dL — ABNORMAL HIGH (ref 70–99)
Potassium: 5 mmol/L (ref 3.5–5.1)
Sodium: 132 mmol/L — ABNORMAL LOW (ref 135–145)
Total Bilirubin: 0.2 mg/dL — ABNORMAL LOW (ref 0.3–1.2)
Total Protein: 7.1 g/dL (ref 6.5–8.1)

## 2020-05-21 LAB — CBC WITH DIFFERENTIAL/PLATELET
Abs Immature Granulocytes: 0.02 10*3/uL (ref 0.00–0.07)
Basophils Absolute: 0 10*3/uL (ref 0.0–0.1)
Basophils Relative: 0 %
Eosinophils Absolute: 0.1 10*3/uL (ref 0.0–0.5)
Eosinophils Relative: 2 %
HCT: 32.8 % — ABNORMAL LOW (ref 36.0–46.0)
Hemoglobin: 10.4 g/dL — ABNORMAL LOW (ref 12.0–15.0)
Immature Granulocytes: 0 %
Lymphocytes Relative: 24 %
Lymphs Abs: 1.2 10*3/uL (ref 0.7–4.0)
MCH: 33.9 pg (ref 26.0–34.0)
MCHC: 31.7 g/dL (ref 30.0–36.0)
MCV: 106.8 fL — ABNORMAL HIGH (ref 80.0–100.0)
Monocytes Absolute: 0.5 10*3/uL (ref 0.1–1.0)
Monocytes Relative: 10 %
Neutro Abs: 3.2 10*3/uL (ref 1.7–7.7)
Neutrophils Relative %: 64 %
Platelets: 303 10*3/uL (ref 150–400)
RBC: 3.07 MIL/uL — ABNORMAL LOW (ref 3.87–5.11)
RDW: 15 % (ref 11.5–15.5)
WBC: 5.1 10*3/uL (ref 4.0–10.5)
nRBC: 0 % (ref 0.0–0.2)

## 2020-05-21 LAB — LACTATE DEHYDROGENASE: LDH: 150 U/L (ref 98–192)

## 2020-05-21 MED ORDER — PROCHLORPERAZINE MALEATE 10 MG PO TABS
10.0000 mg | ORAL_TABLET | Freq: Once | ORAL | Status: AC
  Administered 2020-05-21: 10 mg via ORAL
  Filled 2020-05-21: qty 1

## 2020-05-21 MED ORDER — DEXAMETHASONE 4 MG PO TABS
10.0000 mg | ORAL_TABLET | Freq: Once | ORAL | Status: AC
  Administered 2020-05-21: 10 mg via ORAL
  Filled 2020-05-21: qty 3

## 2020-05-21 MED ORDER — BORTEZOMIB CHEMO SQ INJECTION 3.5 MG (2.5MG/ML)
1.3000 mg/m2 | Freq: Once | INTRAMUSCULAR | Status: AC
  Administered 2020-05-21: 2.25 mg via SUBCUTANEOUS
  Filled 2020-05-21: qty 0.9

## 2020-05-21 NOTE — Progress Notes (Signed)
Ok to proceed with labs patient is on dialysis.  T.O. Dr Rhys Martini, PharmD 05/21/20 @ 1400

## 2020-05-21 NOTE — Progress Notes (Signed)
Patient can be weighed every other treatment verbal order Dr. Delton Coombes.    Patient tolerated Velcade injection with no complaints voiced.  Lab work reviewed.  See MAR for details.  Injection site clean and dry with no bruising or swelling noted.  Patient stable during and after injection.  Band aid applied.  VSS.  Patient left in satisfactory condition with no s/s of distress noted.

## 2020-05-21 NOTE — Patient Instructions (Signed)
[facility]  Discharge Instructions:   _______________________________________________________________  Thank you for choosing [facility] to provide your oncology and hematology care.  To afford each patient quality time with our providers, please arrive at least 15 minutes before your scheduled appointment.  You need to re-schedule your appointment if you arrive 10 or more minutes late.  We strive to give you quality time with our providers, and arriving late affects you and other patients whose appointments are after yours.  Also, if you no show three or more times for appointments you may be dismissed from the clinic.  Again, thank you for choosing [facility]. Our hope is that these requests will allow you access to exceptional care and in a timely manner. _______________________________________________________________  If you have questions after your visit, please contact our office at [phone] between the hours of 8:30 a.m. and 5:00 p.m. Voicemails left after 4:30 p.m. will not be returned until the following business day. _______________________________________________________________  For prescription refill requests, have your pharmacy contact our office. _______________________________________________________________  Recommendations made by the consultant and any test results will be sent to your referring physician. _______________________________________________________________ 

## 2020-05-22 ENCOUNTER — Inpatient Hospital Stay
Admission: RE | Admit: 2020-05-22 | Discharge: 2020-07-05 | Disposition: A | Discharge status: Skilled Nursing Facility | Payer: Medicare PPO | Source: Ambulatory Visit | Attending: Internal Medicine | Admitting: Internal Medicine

## 2020-05-22 ENCOUNTER — Ambulatory Visit (HOSPITAL_COMMUNITY)
Admission: RE | Admit: 2020-05-22 | Discharge: 2020-05-22 | Disposition: A | Discharge status: Home / Self Care | Payer: Medicare PPO | Source: Ambulatory Visit | Attending: Vascular Surgery | Admitting: Vascular Surgery

## 2020-05-22 ENCOUNTER — Ambulatory Visit (HOSPITAL_COMMUNITY)
Admission: RE | Disposition: A | Discharge status: Home / Self Care | Payer: Self-pay | Source: Ambulatory Visit | Attending: Vascular Surgery

## 2020-05-22 DIAGNOSIS — Y841 Kidney dialysis as the cause of abnormal reaction of the patient, or of later complication, without mention of misadventure at the time of the procedure: Secondary | ICD-10-CM | POA: Diagnosis not present

## 2020-05-22 DIAGNOSIS — I120 Hypertensive chronic kidney disease with stage 5 chronic kidney disease or end stage renal disease: Secondary | ICD-10-CM | POA: Diagnosis not present

## 2020-05-22 DIAGNOSIS — Z992 Dependence on renal dialysis: Secondary | ICD-10-CM | POA: Diagnosis not present

## 2020-05-22 DIAGNOSIS — N186 End stage renal disease: Secondary | ICD-10-CM | POA: Diagnosis not present

## 2020-05-22 DIAGNOSIS — E1122 Type 2 diabetes mellitus with diabetic chronic kidney disease: Secondary | ICD-10-CM | POA: Diagnosis not present

## 2020-05-22 DIAGNOSIS — T82858A Stenosis of vascular prosthetic devices, implants and grafts, initial encounter: Secondary | ICD-10-CM | POA: Diagnosis not present

## 2020-05-22 DIAGNOSIS — N185 Chronic kidney disease, stage 5: Secondary | ICD-10-CM | POA: Diagnosis not present

## 2020-05-22 DIAGNOSIS — Z20822 Contact with and (suspected) exposure to covid-19: Secondary | ICD-10-CM | POA: Insufficient documentation

## 2020-05-22 DIAGNOSIS — M7989 Other specified soft tissue disorders: Secondary | ICD-10-CM | POA: Diagnosis present

## 2020-05-22 DIAGNOSIS — Z880 Allergy status to penicillin: Secondary | ICD-10-CM | POA: Insufficient documentation

## 2020-05-22 HISTORY — PX: PERIPHERAL VASCULAR BALLOON ANGIOPLASTY: CATH118281

## 2020-05-22 LAB — KAPPA/LAMBDA LIGHT CHAINS
Kappa free light chain: 207.3 mg/L — ABNORMAL HIGH (ref 3.3–19.4)
Kappa, lambda light chain ratio: 0.77 (ref 0.26–1.65)
Lambda free light chains: 267.8 mg/L — ABNORMAL HIGH (ref 5.7–26.3)

## 2020-05-22 LAB — GLUCOSE, CAPILLARY: Glucose-Capillary: 93 mg/dL (ref 70–99)

## 2020-05-22 LAB — SARS CORONAVIRUS 2 BY RT PCR (HOSPITAL ORDER, PERFORMED IN ~~LOC~~ HOSPITAL LAB): SARS Coronavirus 2: NEGATIVE

## 2020-05-22 SURGERY — PERIPHERAL VASCULAR BALLOON ANGIOPLASTY
Anesthesia: LOCAL | Laterality: Right

## 2020-05-22 MED ORDER — SODIUM CHLORIDE 0.9% FLUSH: 3.0000 mL | Freq: Two times a day (BID) | INTRAVENOUS | Status: DC

## 2020-05-22 MED ORDER — HEPARIN (PORCINE) IN NACL 1000-0.9 UT/500ML-% IV SOLN
INTRAVENOUS | Status: AC
  Filled 2020-05-22: qty 500

## 2020-05-22 MED ORDER — ACETAMINOPHEN 325 MG PO TABS
ORAL_TABLET | ORAL | Status: AC
  Filled 2020-05-22: qty 2

## 2020-05-22 MED ORDER — ACETAMINOPHEN 325 MG PO TABS: 650.0000 mg | ORAL_TABLET | ORAL | Status: DC | PRN

## 2020-05-22 MED ORDER — LORATADINE 10 MG PO TABS
ORAL_TABLET | ORAL | Status: AC
  Filled 2020-05-22: qty 1

## 2020-05-22 MED ORDER — ONDANSETRON HCL 4 MG/2ML IJ SOLN: 4.0000 mg | Freq: Four times a day (QID) | INTRAMUSCULAR | Status: DC | PRN

## 2020-05-22 MED ORDER — HEPARIN (PORCINE) IN NACL 1000-0.9 UT/500ML-% IV SOLN
INTRAVENOUS | Status: DC | PRN
  Administered 2020-05-22: 500 mL

## 2020-05-22 MED ORDER — SODIUM CHLORIDE 0.9 % IV SOLN: 250.0000 mL | INTRAVENOUS | Status: DC | PRN

## 2020-05-22 MED ORDER — HYDRALAZINE HCL 20 MG/ML IJ SOLN: 5.0000 mg | INTRAMUSCULAR | Status: DC | PRN

## 2020-05-22 MED ORDER — IODIXANOL 320 MG/ML IV SOLN
INTRAVENOUS | Status: DC | PRN
  Administered 2020-05-22: 50 mL via INTRAVENOUS

## 2020-05-22 MED ORDER — LABETALOL HCL 5 MG/ML IV SOLN: 10.0000 mg | INTRAVENOUS | Status: DC | PRN

## 2020-05-22 MED ORDER — HEPARIN SODIUM (PORCINE) 1000 UNIT/ML IJ SOLN
INTRAMUSCULAR | Status: AC
  Filled 2020-05-22: qty 1

## 2020-05-22 MED ORDER — LIDOCAINE HCL (PF) 1 % IJ SOLN
INTRAMUSCULAR | Status: AC
  Filled 2020-05-22: qty 30

## 2020-05-22 MED ORDER — LIDOCAINE HCL (PF) 1 % IJ SOLN
INTRAMUSCULAR | Status: DC | PRN
  Administered 2020-05-22: 50 mL via INTRADERMAL

## 2020-05-22 MED ORDER — FAMOTIDINE 20 MG PO TABS
ORAL_TABLET | ORAL | Status: AC
  Filled 2020-05-22: qty 1

## 2020-05-22 MED ORDER — SODIUM CHLORIDE 0.9% FLUSH: 3.0000 mL | INTRAVENOUS | Status: DC | PRN

## 2020-05-22 SURGICAL SUPPLY — 18 items
BALLN ATLAS 14X40X75 (BALLOONS) ×2
BALLN MUSTANG 12X60X75 (BALLOONS) ×2
BALLOON ATLAS 14X40X75 (BALLOONS) IMPLANT
BALLOON MUSTANG 12X60X75 (BALLOONS) IMPLANT
CATH ANGIO 5F BER2 65CM (CATHETERS) ×1 IMPLANT
CATH PALINDROME-P 23CM W/VT (CATHETERS) ×1 IMPLANT
COVER DOME SNAP 22 D (MISCELLANEOUS) ×1 IMPLANT
DEVICE TORQUE H2O (MISCELLANEOUS) ×1 IMPLANT
GUIDEWIRE ANGLED .035X150CM (WIRE) ×1 IMPLANT
KIT ENCORE 26 ADVANTAGE (KITS) ×1 IMPLANT
KIT MICROPUNCTURE NIT STIFF (SHEATH) ×1 IMPLANT
SHEATH PINNACLE 5F 10CM (SHEATH) ×1 IMPLANT
SHEATH PINNACLE R/O II 7F 4CM (SHEATH) ×1 IMPLANT
SHEATH PROBE COVER 6X72 (BAG) ×1 IMPLANT
STOPCOCK MORSE 400PSI 3WAY (MISCELLANEOUS) ×1 IMPLANT
TRAY PV CATH (CUSTOM PROCEDURE TRAY) ×2 IMPLANT
TUBING CIL FLEX 10 FLL-RA (TUBING) ×1 IMPLANT
WIRE ROSEN-J .035X180CM (WIRE) ×1 IMPLANT

## 2020-05-22 NOTE — Discharge Instructions (Signed)
Central Line Dialysis Access Placement, Care After This sheet gives you information about how to care for yourself after your procedure. Your health care provider may also give you more specific instructions. If you have problems or questions, contact your health care provider. What can I expect after the procedure? After the procedure, it is common to have:  Mild pain or discomfort.  Mild redness, swelling, or bruising around your incision.  A small amount of blood or clear fluid coming from your incision. Follow these instructions at home: Medicines  Take over-the-counter and prescription medicines only as told by your health care provider.  If you were prescribed an antibiotic medicine, take it as told by your health care provider. Do not stop using the antibiotic even if you start to feel better. Incision care  Follow instructions from your health care provider about how to take care of your incision. Make sure you: ? Wash your hands with soap and water for at least 20 seconds before and after you change your bandage (dressing). If soap and water are not available, use hand sanitizer. ? Change your dressing as told by your health care provider. ? Leave stitches (sutures) in place.  Check your incision area every day for signs of infection. Check for: ? More redness, swelling, or pain. ? More fluid or blood. ? Warmth. ? Pus or a bad smell.   Managing pain and swelling  If directed, apply heat to the affected area as often as told by your health care provider. Use the heat source that your health care provider recommends, such as a moist heat pack or a heating pad. ? Place a towel between your skin and the heat source. ? Leave the heat on for 20-30 minutes. ? Remove the heat if your skin turns bright red. This is especially important if you are unable to feel pain, heat, or cold. You may have a greater risk of getting burned.  If directed, put ice on the catheter site. To do  this: ? Put ice in a plastic bag. ? Place a towel between your skin and the bag. ? Leave the ice on for 20 minutes, 2-3 times a day. Activity  If you were given a sedative during the procedure, it can affect you for several hours. Do not drive or operate machinery until your health care provider says that it is safe.  Do not lift anything that is heavier than 10 lb (4.5 kg), or the limit that you are told, until your health care provider says that it is safe.  Return to your normal activities as told by your health care provider. Ask your health care provider what activities are safe for you. General instructions  Follow instructions from your health care provider about eating or drinking restrictions.  Do not use any products that contain nicotine or tobacco, such as cigarettes, e-cigarettes, and chewing tobacco. These can delay incision healing after surgery. If you need help quitting, ask your health care provider.  Do not take baths, swim, or use a hot tub until your health care provider approves. Ask your health care provider if you may take showers. You may only be allowed to take sponge baths.  Wear compression stockings as told by your health care provider. These stockings help to prevent blood clots and reduce swelling in your legs.  Keep all follow-up visits as told by your health care provider. This is important.   Contact a health care provider if:  Your catheter gets pulled   out of place.  Your catheter site becomes itchy.  You develop a rash around your catheter site.  You have any of these signs of infection: ? More redness, swelling, or pain around your incision. ? More fluid or blood coming from your incision. ? Warmth coming from your incision. ? Pus or a bad smell coming from your incision. ? A fever. Get help right away if:  You become light-headed or dizzy.  You have chest pain or a fast heart rate.  You faint.  You have difficulty breathing.  Your  catheter gets pulled out completely.  You have discomfort in your arms, back, neck, or jaw. These symptoms may represent a serious problem that is an emergency. Do not wait to see if the symptoms will go away. Get medical help right away. Call your local emergency services (911 in the [place] Do not drive yourself to the hospital. Summary  After the procedure, it is common to have mild symptoms of pain, redness, swelling, or bruising around your incision. It is also common to have a small amount of blood or clear fluid coming from your incision.  Check your incision area every day for signs of infection.  Do not take baths, swim, or use a hot tub until your health care provider approves. Ask your health care provider if you may take showers. You may only be allowed to take sponge baths.  Get help right away if you have chest pain or difficulty breathing, or if your catheter gets pulled out completely. This information is not intended to replace advice given to you by your health care provider. Make sure you discuss any questions you have with your health care provider. Document Revised: 01/12/2019 Document Reviewed: 01/12/2019 Elsevier Patient Education  2021 [place]. Dialysis Fistulogram, Care After The following information offers guidance on how to care for yourself after your procedure. Your health care provider may also give you more specific instructions. If you have problems or questions, contact your health care provider. What can I expect after the procedure? After the procedure, it is common to have:  A small amount of discomfort in the area where the small tube (catheter) was placed for the procedure.  A small amount of bruising around the fistula.  Sleepiness and tiredness (fatigue). Follow these instructions at home: Puncture site care  Follow instructions from your health care provider about how to take care of the site where catheters were inserted. Make sure  you: ? Wash your hands with soap and water for at least 20 seconds before and after you change your bandage (dressing). If soap and water are not available, use hand sanitizer. ? Change your dressing as told by your health care provider. ? Leave stitches (sutures), skin glue, or adhesive strips in place. These skin closures may need to stay in place for 2 weeks or longer. If adhesive strip edges start to loosen and curl up, you may trim the loose edges. Do not remove adhesive strips completely unless your health care provider tells you to do that.  Check your puncture area every day for signs of infection. Check for: ? More redness, swelling, or pain. ? Fluid or blood. ? Warmth. ? Pus or a bad smell.   Activity  Rest as much as you can.  If you were given a sedative during the procedure, it can affect you for several hours. Do not drive or operate machinery until your health care provider says that it is safe.  Do not  lift anything that is heavier than 5 lb (2.3 kg), or the limit that you are told, on the day of your procedure.  Do not do anything strenuous with your arm for the rest of the day. Avoid household activities, such as vacuuming.  Return to your normal activities as told by your health care provider. Ask your health care provider what activities are safe for you. Safety To prevent damage to your graft or fistula:  Do not wear tight-fitting clothing or jewelry on the arm or leg that has your graft or fistula.  Tell all your health care providers that you have a dialysis fistula or graft.  Do not allow blood draws, IVs, or blood pressure readings to be done in the arm that has your fistula or graft.  Do not allow flu shots or vaccinations in the arm with your fistula or graft. General instructions  Take over-the-counter and prescription medicines only as told by your health care provider.  Do not take baths, swim, or use a hot tub until your health care provider approves.  Ask your health care provider if you may take showers. You may only be allowed to take sponge baths.  Monitor your dialysis fistula closely. Check to make sure that you can feel a vibration or buzz (a thrill) when you put your fingers over the fistula.  Keep all follow-up visits. This is important. Contact a health care provider if:  You have more redness, swelling, or pain at the site where the catheter was put in.  You have fluid or blood coming from the catheter site.  You have pus or a bad smell coming from the catheter site.  Your catheter site feels warm.  You have a fever or chills. Get help right away if:  You have bleeding from the vascular access site that does not stop.  You feel weak.  You have trouble balancing.  You have trouble moving your arms or legs.  You have problems with your speech or vision.  You can no longer feel a vibration or buzz when you put your fingers over your fistula.  The limb that was used for the procedure swells or becomes painful, cold, blue, or pale white.  You have chest pain or shortness of breath. These symptoms may represent a serious problem that is an emergency. Do not wait to see if the symptoms will go away. Get medical help right away. Call your local emergency services (911 in the [place] Do not drive yourself to the hospital. Summary  After a dialysis fistulogram, it is common to have a small amount of discomfort or bruising in the area where the small, thin tube (catheter) was placed.  Rest as much as you can after your procedure. Return to your normal activities as told by your health care provider.  Take over-the-counter and prescription medicines only as told by your health care provider.  Follow instructions from your health care provider about how to take care of the site where the catheter was inserted.  Keep all follow-up visits. This is important. This information is not intended to replace advice given to you by  your health care provider. Make sure you discuss any questions you have with your health care provider. Document Revised: 10/04/2019 Document Reviewed: 10/04/2019 Elsevier Patient Education  [place].

## 2020-05-22 NOTE — Progress Notes (Signed)
Report called to nurse for client at [facility]

## 2020-05-22 NOTE — H&P (Signed)
[facility] [place]  ASSESSMENT / PLAN: 85 y.o. female with R BC AVF 04/04/20 and RIJ TDC She has severe symptomatic swelling of her right arm Likely outflow stenosis at the catheter site Will plan to move [facility] to [facility] and do fistulagram to Rx outflow stenosis  CHIEF COMPLAINT: right arm swelling  HISTORY OF PRESENT ILLNESS: Carly Wood is a 85 y.o. year old female who presents for postoperative follow-up for: right brachiocephalic arteriovenous fistula (Date: 04/04/20) by Dr. Donnetta Hutching.  She is currently dialyzing via right IJ [facility] on a Tuesday Thursday Saturday schedule.  She currently resides in a nursing facility and is accompanied by a staff member today.  She states her incision is well-healed.  She denies any pain or numbness or weakness in her right hand.  She presents today with complaint of right upper extremity edema.  She believes it progressively has worsened since surgery.  Past Medical History:  Diagnosis Date  . Anemia   . Anxiety   . Chronic kidney disease   . Chronic renal disease, stage 4, severely decreased glomerular filtration rate (GFR) between 15-29 mL/min/1.73 square meter (HCC) 08/22/2015  . Complication of anesthesia    delirious after Breast Surgery  . Dementia ([place])    mild  . Depression   . Diabetes mellitus without complication ([place])    type II  . Dyspnea    with activity  . GERD (gastroesophageal reflux disease)   . Glaucoma   . Hypertension   . Pneumonia   . Stage 1 infiltrating ductal carcinoma of right female breast ([place]) 08/21/2015   ER+ PR+ HER 2 neu + (3+) T1cN0     Past Surgical History:  Procedure Laterality Date  . AV FISTULA PLACEMENT Left 11/22/2017   Procedure: ARTERIOVENOUS (AV) FISTULA CREATION LEFT ARM;  Surgeon: Elam Dutch, MD;  Location: [facility] OR;  Service: Vascular;  Laterality: Left;  . AV FISTULA PLACEMENT Right 04/04/2020   Procedure: RIGHT ARM ARTERIOVENOUS FISTULA CREATION;  Surgeon: Rosetta Posner, MD;   Location: [facility] ORS;  Service: Vascular;  Laterality: Right;  . BIOPSY  08/07/2016   Procedure: BIOPSY;  Surgeon: Daneil Dolin, MD;  Location: [facility] ENDO SUITE;  Service: Endoscopy;;  gastric ulcer biopsy  . COLONOSCOPY    . ESOPHAGOGASTRODUODENOSCOPY N/A 08/07/2016   Procedure: ESOPHAGOGASTRODUODENOSCOPY (EGD);  Surgeon: Daneil Dolin, MD;  Location: [facility] ENDO SU[facility];  Service: Endoscopy;  Laterality: N/A;  1215-rescheduled to 6/1 @ 2:30pm per Tretha Sciara  . ESOPHAGOGASTRODUODENOSCOPY N/A 11/27/2016   Procedure: ESOPHAGOGASTRODUODENOSCOPY (EGD);  Surgeon: Daneil Dolin, MD;  [facility];  Service: Endoscopy;  Laterality: N/A;  8:15am  . FISTULA SUPERFICIALIZATION Left 02/14/2018   Procedure: FISTULA SUPERFICIALIZATION LEFT ARM;  Surgeon: Angelia Mould, MD;  Location: [facility];  Service: Vascular;  Laterality: Left;  . FRACTURE SURGERY Right    ankle  . MALONEY DILATION N/A 08/07/2016   Procedure: Venia Minks DILATION;  Surgeon: Daneil Dolin, MD;  Location: [facility];  Service: Endoscopy;  Laterality: N/A;  . MASTECTOMY, PARTIAL Right   . PERIPHERAL VASCULAR BALLOON ANGIOPLASTY Left 07/13/2019   Procedure: PERIPHERAL VASCULAR BALLOON ANGIOPLASTY;  Surgeon: Marty Heck, MD;  Location: [place] LAB;  Service: Cardiovascular;  Laterality: Left;  arm fistulogram  . PORT-A-CATH REMOVAL Left 11/22/2017   Procedure: REMOVAL PORT-A-CATH LEFT CHEST;  Surgeon: Elam Dutch, MD;  Location: [facility] OR;  Service: Vascular;  Laterality: Left;  . RETINAL DETACHMENT SURGERY Right  Family History  Problem Relation Age of Onset  . Multiple myeloma Sister   . Brain cancer Sister   . Dementia Mother        died at 5  . Stroke Mother   . Heart failure Mother   . Diabetes Mother   . Heart disease Father   . Prostate cancer Brother   . Colon cancer Neg Hx     Social History   Socioeconomic History  . Marital status: Single    Spouse name: Not on file  . Number of children:  Not on file  . Years of education: Not on file  . Highest education level: Not on file  Occupational History  . Occupation: retired   Tobacco Use  . Smoking status: Never Smoker  . Smokeless tobacco: Never Used  Vaping Use  . Vaping Use: Never used  Substance and Sexual Activity  . Alcohol use: No    Alcohol/week: 0.0 standard drinks  . Drug use: No  . Sexual activity: Never  Other Topics Concern  . Not on file  Social History Narrative   Long term resident of SNF    Social Determinants of Health   Financial Resource Strain: Low Risk   . Difficulty of Paying Living Expenses: Not very hard  Food Insecurity: No Food Insecurity  . Worried About Charity fundraiser in the Last Year: Never true  . Ran Out of Food in the Last Year: Never true  Transportation Needs: No Transportation Needs  . Lack of Transportation (Medical): No  . Lack of Transportation (Non-Medical): No  Physical Activity: Inactive  . Days of Exercise per Week: 0 days  . Minutes of Exercise per Session: 0 min  Stress: No Stress Concern Present  . Feeling of Stress : Not at all  Social Connections: Moderately Isolated  . Frequency of Communication with Friends and Family: More than three times a week  . Frequency of Social Gatherings with Friends and Family: Once a week  . Attends Religious Services: More than 4 times per year  . Active Member of Clubs or Organizations: No  . Attends Archivist Meetings: Never  . Marital Status: Never married  Intimate Partner Violence: Not At Risk  . Fear of Current or Ex-Partner: No  . Emotionally Abused: No  . Physically Abused: No  . Sexually Abused: No    Allergies  Allergen Reactions  . Ace Inhibitors Other (See Comments) and Cough    Tongue swell  . Penicillins Other (See Comments)    Unsure of reaction Has patient had a PCN reaction causing immediate rash, facial/tongue/throat swelling, SOB or lightheadedness with hypotension: Unknown Has patient  had a PCN reaction causing severe rash involving mucus membranes or skin necrosis: Unknown Has patient had a PCN reaction that required hospitalization: No Has patient had a PCN reaction occurring within the last 10 years: Unknown If all of the above answers are "NO", then may proceed with Cephalosporin use.      No current facility-administered medications for this encounter.    REVIEW OF SYSTEMS:  '[X]'  denotes positive finding, '[ ]'  denotes negative finding Cardiac  Comments:  Chest pain or chest pressure:    Shortness of breath upon exertion:    Short of breath when lying flat:    Irregular heart rhythm:        Vascular    Pain in calf, thigh, or hip brought on by ambulation:    Pain in feet at night that  wakes you up from your sleep:     Blood clot in your veins:    Leg swelling:         Pulmonary    Oxygen at home:    Productive cough:     Wheezing:         Neurologic    Sudden weakness in arms or legs:     Sudden numbness in arms or legs:     Sudden onset of difficulty speaking or slurred speech:    Temporary loss of vision in one eye:     Problems with dizziness:         Gastrointestinal    Blood in stool:     Vomited blood:         Genitourinary    Burning when urinating:     Blood in urine:        Psychiatric    Major depression:         Hematologic    Bleeding problems:    Problems with blood clotting too easily:        Skin    Rashes or ulcers:        Constitutional    Fever or chills:      PHYSICAL EXAM  Vitals:   05/22/20 0911  BP: (!) 141/45  Pulse: (!) 58  Temp: 97.9 F (36.6 C)  TempSrc: Oral  SpO2: 100%  Weight: 63.9 kg  Height: '5\' 3"'  (1.6 m)    Constitutional: well appearing. no distress. Appears well nourished.  Neurologic: CN intact. no focal findings. no sensory loss. Psychiatric: Mood and affect symmetric and appropriate. Eyes: No icterus. No conjunctival pallor. Ears, nose, throat: mucous membranes moist. Midline  trachea.  Cardiac: regular rate and rhythm.  Respiratory: unlabored. Abdominal: soft, non-tender, non-distended.  Peripheral vascular:  Strong thrill in R BC AVF  R IJ TDC in place  2+ edema in R arm from fingers to shoulder Extremity: No edema. No cyanosis. No pallor.  Skin: No gangrene. No ulceration.  Lymphatic: No Stemmer's sign. No palpable lymphadenopathy.  PERTINENT LABORATORY AND RADIOLOGIC DATA  Most recent CBC CBC Latest Ref Rng & Units 05/21/2020 04/30/2020 04/23/2020  WBC 4.0 - 10.5 K/uL 5.1 6.6 6.8  Hemoglobin 12.0 - 15.0 g/dL 10.4(L) 10.6(L) 10.5(L)  Hematocrit 36.0 - 46.0 % 32.8(L) 34.6(L) 34.3(L)  Platelets 150 - 400 K/uL 303 244 261     Most recent CMP CMP Latest Ref Rng & Units 05/21/2020 04/30/2020 04/23/2020  Glucose 70 - 99 mg/dL 186(H) 254(H) 267(H)  BUN 8 - 23 mg/dL 41(H) 38(H) 43(H)  Creatinine 0.44 - 1.00 mg/dL 7.54(H) 7.07(H) 7.55(H)  Sodium 135 - 145 mmol/L 132(L) 133(L) 135  Potassium 3.5 - 5.1 mmol/L 5.0 4.6 4.8  Chloride 98 - 111 mmol/L 90(L) 91(L) 94(L)  CO2 22 - 32 mmol/L '25 28 25  ' Calcium 8.9 - 10.3 mg/dL 8.4(L) 8.4(L) 8.7(L)  Total Protein 6.5 - 8.1 g/dL 7.1 6.7 6.9  Total Bilirubin 0.3 - 1.2 mg/dL 0.2(L) 0.5 0.6  Alkaline Phos 38 - 126 U/L 74 77 87  AST 15 - 41 U/L '20 20 25  ' ALT 0 - 44 U/L '12 14 14    ' Renal function Estimated Creatinine Clearance: 5.2 mL/min (A) (by C-G formula based on SCr of 7.54 mg/dL (H)).  Hgb A1c MFr Bld (%)  Date Value  12/20/2019 8.0 (H)    No results found for: LDLCALC, LDLC, HIRISKLDL, POCLDL, LDLDIRECT, REALLDLC, TOTLDLC   Lameisha Schuenemann N. Stanford Breed, MD  [facility] Phone Number: [phone] 05/22/2020 12:03 PM

## 2020-05-22 NOTE — Op Note (Signed)
DATE OF SERVICE: 05/22/2020  PATIENT:  Carly Wood  85 y.o. female  PRE-OPERATIVE DIAGNOSIS:  Severe right upper extremity swelling after brachiocephalic AVF creation; RIJ TDC in place  POST-OPERATIVE DIAGNOSIS:  Same  PROCEDURE:   1) TDC removal 2) LIJ TDC placement (23cm catheter) 3) US guided access of RUE AVF 4) Fistulagram 5) Central venogram (60mL total contrast) 6) Angioplasty of right innominate vein / superior vena cava occlusion (14x32mm Atlas)  SURGEON:  Surgeon(s) and Role:    * Cherre Robins, MD - Primary  ASSISTANT: none  An assistant was required to facilitate exposure and expedite the case.  ANESTHESIA:   local  EBL: min  BLOOD ADMINISTERED:none  DRAINS: none   LOCAL MEDICATIONS USED:  LIDOCAINE   SPECIMEN:  none  COUNTS: confirmed correct.  TOURNIQUET:  None  PATIENT DISPOSITION:  PACU - hemodynamically stable.   Delay start of Pharmacological VTE agent (>24hrs) due to surgical blood loss or risk of bleeding: no  INDICATION FOR PROCEDURE: Carly Wood is a 85 y.o. female with severe swelling of right upper extremity about 1.5 months after right brachiocephalic arteriovenous fistula creation. She had a right IJ TDC placed as well. After careful discussion of risks, benefits, and alternatives the patient was offered [facility] exchange and fistulagram. The patient  understood and wished to proceed.  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 The patient was positioned supine on the operating room table. Anesthesia was induced. The groins was prepped and draped in standard fashion. A  surgical pause was performed confirming correct patient, procedure, and operative location.  The catheter exit site in the right chest was prepped and draped in usual sterile fashion.  After infiltration of 1% lidocaine around the felt cuff of the catheter.  I dissected the catheter free from the subcutaneous tissue using combination of blunt and sharp dissection.  Catheter was ultimately freed at the felt cuff.  It was removed easily intact.  A bandage was applied to the catheter exit site.  Manual pressure was held to the neck for several minutes.  Hemostasis was achieved.  Using ultrasound guidance the left internal jugular vein was accessed with micropuncture technique.  Through the micropuncture sheath a floppy J-wire was advanced into the superior vena cava.  A small incision was made around the skin access point.  The access point was serially dilated under direct fluoroscopic guidance.  A peel-away sheath was introduced into the superior vena cava under fluoroscopic guidance.  A counterincision was made in the chest under the clavicle.  A 23 cm tunnel dialysis catheter was then tunneled under the skin, over the clavicle into the incision in the neck.  The tunneling device was removed and the catheter fed through the peel-away sheath into the superior vena cava.  The peel-away sheath was removed and the catheter gently pulled back.  Adequate position was confirmed with x-ray.  The catheter was tested and found to flush and draw back well.  Catheter was heparin locked.  Caps were applied.  Catheter was sutured to the skin.  The neck incision was closed with 4-0 Monocryl.  Using ultrasound guidance, the right brachiocephalic AV fistula was accessed using micropuncture technique.  We upsized to 7 [place] sheath.  Fistulogram was performed.  This revealed extreme filling of collaterals about the shoulder.  An occlusion was noted at the confluence of the left innominate vein and superior vena cava.   Angioplasty this first with a 12 x 60 Mustang balloon, then with a 14 x 40 Atlas balloon.  Some residual stenosis was noted, but the patient could not tolerate more angioplasty.  Wire and balloon was removed a pursestring suture was placed around the access site and secured after removing the sheath.  IMA stasis was achieved.  Clean bandages were applied to all surgical sites.  Upon completion of the case instrument and sharps counts were confirmed correct. The patient was transferred to the PACU in good condition. I was present for all portions of the procedure.  PLAN: Follow-up in 2 weeks in [place] clinic with repeat AV fistula duplex.  If arm swelling improved, may be able to transition to using AV fistula.  Use tunneled dialysis catheter for now.  [name]. Stanford Breed, MD [facility] Phone Number: [phone] 05/22/2020 2:28 PM

## 2020-05-22 NOTE — H&P (Signed)
See below for H&P details Patient with R BC AVF 04/04/20 and RIJ TDC She has severe symptomatic swelling of her right arm Likely outflow stenosis at the catheter site Will plan to move [facility] to LIJ and do fistulagram to Rx outflow stenosis  Carly Wood. Stanford Breed, MD [facility] Phone Number: [phone] 05/22/2020 10:01 AM    Postoperative Access Visit   History of Present Illness   Carly Wood is a 85 y.o. year old female who presents for postoperative follow-up for: right brachiocephalic arteriovenous fistula (Date: 04/04/20) by Dr. Donnetta Hutching.  She is currently dialyzing via right IJ [facility] on a Tuesday Thursday Saturday schedule.  She currently resides in a nursing facility and is accompanied by a staff member today.  She states her incision is well-healed.  She denies any pain or numbness or weakness in her right hand.  She presents today with complaint of right upper extremity edema.  She believes it progressively has worsened since surgery.   Physical Examination   Vitals:   05/22/20 0911  BP: (!) 141/45  Pulse: (!) 58  Temp: 97.9 F (36.6 C)  TempSrc: Oral  SpO2: 100%  Weight: 63.9 kg  Height: 5\' 3"  (1.6 m)   Body mass index is 24.94 kg/m.  right arm Incision is healed, palpable radial pulse, hand grip is 5/5, sensation in digits is intact, palpable thrill, bruit can be auscultated; pitting edema in the hand and forearm    Medical Decision Making   Carly Wood is a 85 y.o. year old female who presents s/p right brachiocephalic arteriovenous fistula   Patent fistula without signs or symptoms of steal syndrome  Encouraged the patient to elevate her arm and exercise her hand during the day  Patient was wrapped with an Ace bandage from her hand up to her elbow; she should be rewrapped daily  We will reevaluate right upper extremity edema in 2 to 3 weeks of using conservative measures; if edema worsens we will schedule patient for a  venogram of her right upper extremity and potentially move her [facility] to the left side pending results of venogram   Dagoberto Ligas PA-C [facility] [place] Office: [phone]  Clinic MD: Scot Dock

## 2020-05-23 ENCOUNTER — Non-Acute Institutional Stay (SKILLED_NURSING_FACILITY): Payer: Medicare PPO | Admitting: Adult Health

## 2020-05-23 ENCOUNTER — Encounter (HOSPITAL_COMMUNITY): Payer: Self-pay | Admitting: Vascular Surgery

## 2020-05-23 DIAGNOSIS — F0151 Vascular dementia with behavioral disturbance: Secondary | ICD-10-CM | POA: Diagnosis not present

## 2020-05-23 DIAGNOSIS — Z992 Dependence on renal dialysis: Secondary | ICD-10-CM

## 2020-05-23 DIAGNOSIS — B009 Herpesviral infection, unspecified: Secondary | ICD-10-CM | POA: Diagnosis not present

## 2020-05-23 DIAGNOSIS — D631 Anemia in chronic kidney disease: Secondary | ICD-10-CM | POA: Diagnosis not present

## 2020-05-23 DIAGNOSIS — F01518 Vascular dementia, unspecified severity, with other behavioral disturbance: Secondary | ICD-10-CM

## 2020-05-23 DIAGNOSIS — N186 End stage renal disease: Secondary | ICD-10-CM | POA: Diagnosis not present

## 2020-05-23 LAB — PROTEIN ELECTROPHORESIS, SERUM
A/G Ratio: 1.2 (ref 0.7–1.7)
Albumin ELP: 3.7 g/dL (ref 2.9–4.4)
Alpha-1-Globulin: 0.2 g/dL (ref 0.0–0.4)
Alpha-2-Globulin: 0.9 g/dL (ref 0.4–1.0)
Beta Globulin: 1 g/dL (ref 0.7–1.3)
Gamma Globulin: 0.8 g/dL (ref 0.4–1.8)
Globulin, Total: 3 g/dL (ref 2.2–3.9)
Total Protein ELP: 6.7 g/dL (ref 6.0–8.5)

## 2020-05-23 MED FILL — Heparin Sodium (Porcine) Inj 1000 Unit/ML: INTRAMUSCULAR | Qty: 10 | Status: AC

## 2020-05-23 NOTE — Progress Notes (Signed)
Location:  [place] Room Number: 148 Place of Service:  SNF (31)   CODE STATUS: full code   Allergies  Allergen Reactions  . Ace Inhibitors Other (See Comments) and Cough    Tongue swell  . Penicillins Other (See Comments)    Unsure of reaction Has patient had a PCN reaction causing immediate rash, facial/tongue/throat swelling, SOB or lightheadedness with hypotension: Unknown Has patient had a PCN reaction causing severe rash involving mucus membranes or skin necrosis: Unknown Has patient had a PCN reaction that required hospitalization: No Has patient had a PCN reaction occurring within the last 10 years: Unknown If all of the above answers are "NO", then may proceed with Cephalosporin use.      Chief Complaint  Patient presents with  . Medical Management of Chronic Issues          Anemia due to end stage renal disease:    Herpes:  Vascular dementia with behavioral disturbance    HPI:  She is a 85 year old long term resident of this facility being seen for the management of her chronic illnesses: Anemia due to end stage renal disease:    Herpes:  Vascular dementia with behavioral disturbance. There are no reports of agitation or anxiety. No reports of changes in appetite; weight is stable.    Past Medical History:  Diagnosis Date  . Anemia   . Anxiety   . Chronic kidney disease   . Chronic renal disease, stage 4, severely decreased glomerular filtration rate (GFR) between 15-29 mL/min/1.73 square meter (HCC) 08/22/2015  . Complication of anesthesia    delirious after Breast Surgery  . Dementia ([place])    mild  . Depression   . Diabetes mellitus without complication ([place])    type II  . Dyspnea    with activity  . GERD (gastroesophageal reflux disease)   . Glaucoma   . Hypertension   . Pneumonia   . Stage 1 infiltrating ductal carcinoma of right female breast ([place]) 08/21/2015   ER+ PR+ HER 2 neu + (3+) T1cN0     Past Surgical History:   Procedure Laterality Date  . AV FISTULA PLACEMENT Left 11/22/2017   Procedure: ARTERIOVENOUS (AV) FISTULA CREATION LEFT ARM;  Surgeon: Elam Dutch, MD;  Location: [facility] OR;  Service: Vascular;  Laterality: Left;  . AV FISTULA PLACEMENT Right 04/04/2020   Procedure: RIGHT ARM ARTERIOVENOUS FISTULA CREATION;  Surgeon: Rosetta Posner, MD;  Location: [facility] ORS;  Service: Vascular;  Laterality: Right;  . BIOPSY  08/07/2016   Procedure: BIOPSY;  Surgeon: Daneil Dolin, MD;  Location[facility] ENDO SU[facility];  Service: Endoscopy;;  gastric ulcer biopsy  . COLONOSCOPY    . ESOPHAGOGASTRODUODENOSCOPY N/A 08/07/2016   Procedure: ESOPHAGOGASTRODUODENOSCOPY (EGD);  Surgeon: Daneil Dolin, MD;  [facility];  Service: Endoscopy;  Laterality: N/A;  1215-rescheduled to 6/1 @ 2:30pm per Tretha Sciara  . ESOPHAGOGASTRODUODENOSCOPY N/A 11/27/2016   Procedure: ESOPHAGOGASTRODUODENOSCOPY (EGD);  Surgeon: Daneil Dolin, MD;  [facility];  Service: Endoscopy;  Laterality: N/A;  8:15am  . FISTULA SUPERFICIALIZATION Left 02/14/2018   Procedure: FISTULA SUPERFICIALIZATION LEFT ARM;  Surgeon: Angelia Mould, MD;  Location: [place];  Service: Vascular;  Laterality: Left;  . FRACTURE SURGERY Right    ankle  . MALONEY DILATION N/A 08/07/2016   Procedure: Venia Minks DILATION;  Surgeon: Daneil Dolin, MD;  [facility];  Service: Endoscopy;  Laterality: N/A;  . MASTECTOMY, PARTIAL Right   .  PERIPHERAL VASCULAR BALLOON ANGIOPLASTY Left 07/13/2019   Procedure: PERIPHERAL VASCULAR BALLOON ANGIOPLASTY;  Surgeon: Marty Heck, MD;  Location: [place] CV LAB;  Service: Cardiovascular;  Laterality: Left;  arm fistulogram  . PERIPHERAL VASCULAR BALLOON ANGIOPLASTY Right 05/22/2020   Procedure: PERIPHERAL VASCULAR BALLOON ANGIOPLASTY;  Surgeon: Cherre Robins, MD;  Location: [place] CV LAB;  Service: Cardiovascular;  Laterality: Right;  arm fistula  . PORT-A-CATH REMOVAL Left 11/22/2017   Procedure:  REMOVAL PORT-A-CATH LEFT CHEST;  Surgeon: Elam Dutch, MD;  Location: [facility] OR;  Service: Vascular;  Laterality: Left;  . RETINAL DETACHMENT SURGERY Right     Social History   Socioeconomic History  . Marital status: Single    Spouse name: Not on file  . Number of children: Not on file  . Years of education: Not on file  . Highest education level: Not on file  Occupational History  . Occupation: retired   Tobacco Use  . Smoking status: Never Smoker  . Smokeless tobacco: Never Used  Vaping Use  . Vaping Use: Never used  Substance and Sexual Activity  . Alcohol use: No    Alcohol/week: 0.0 standard drinks  . Drug use: No  . Sexual activity: Never  Other Topics Concern  . Not on file  Social History Narrative   Long term resident of SNF    Social Determinants of Health   Financial Resource Strain: Low Risk   . Difficulty of Paying Living Expenses: Not very hard  Food Insecurity: No Food Insecurity  . Worried About Charity fundraiser in the Last Year: Never true  . Ran Out of Food in the Last Year: Never true  Transportation Needs: No Transportation Needs  . Lack of Transportation (Medical): No  . Lack of Transportation (Non-Medical): No  Physical Activity: Inactive  . Days of Exercise per Week: 0 days  . Minutes of Exercise per Session: 0 min  Stress: No Stress Concern Present  . Feeling of Stress : Not at all  Social Connections: Moderately Isolated  . Frequency of Communication with Friends and Family: More than three times a week  . Frequency of Social Gatherings with Friends and Family: Once a week  . Attends Religious Services: More than 4 times per year  . Active Member of Clubs or Organizations: No  . Attends Archivist Meetings: Never  . Marital Status: Never married  Intimate Partner Violence: Not At Risk  . Fear of Current or Ex-Partner: No  . Emotionally Abused: No  . Physically Abused: No  . Sexually Abused: No   Family History  Problem  Relation Age of Onset  . Multiple myeloma Sister   . Brain cancer Sister   . Dementia Mother        died at 35  . Stroke Mother   . Heart failure Mother   . Diabetes Mother   . Heart disease Father   . Prostate cancer Brother   . Colon cancer Neg Hx       VITAL SIGNS BP 112/60   Pulse 84   Temp 98.4 F (36.9 C)   Ht '5\' 3"'  (1.6 m)   Wt 146 lb 9.6 oz (66.5 kg)   BMI 25.97 kg/m   Outpatient Encounter Medications as of 05/23/2020  Medication Sig  . acetaminophen (TYLENOL) 650 MG CR tablet Take 650 mg by mouth every 6 (six) hours.  Marland Kitchen acyclovir (ZOVIRAX) 200 MG capsule Take 200 mg by mouth daily.  Marland Kitchen amLODipine (  NORVASC) 10 MG tablet Take 10 mg by mouth daily. Not taking on dialysis days  . anastrozole (ARIMIDEX) 1 MG tablet TAKE 1 TABLET BY MOUTH DAILY (Patient taking differently: Take 1 mg by mouth daily.)  . aspirin EC 81 MG tablet Take 81 mg by mouth daily.  . calcium acetate (PHOSLO) 667 MG capsule Take 667-1,334 mg by mouth See admin instructions. Take 1,334 mg twice daily with meals and 667 mg once daily with snacks  . calcium-vitamin D (OSCAL WITH D) 500-200 MG-UNIT tablet Take 1 tablet by mouth daily. At 8 am  . cloNIDine (CATAPRES) 0.3 MG tablet Take 0.3 mg by mouth 2 (two) times daily.   . insulin aspart (NOVOLOG FLEXPEN) 100 UNIT/ML FlexPen Inject 10 Units into the skin 2 (two) times daily. 8 am and 5 pm, Hold for accu-check less than 150.  Marland Kitchen insulin aspart (NOVOLOG FLEXPEN) 100 UNIT/ML FlexPen Inject 15 Units into the skin daily at 12 noon. 12 pm,  Hold for accu-check less than 150  . insulin glargine (LANTUS SOLOSTAR) 100 UNIT/ML Solostar Pen Inject 10 Units into the skin at bedtime.  . melatonin 3 MG TABS tablet Take 6 mg by mouth at bedtime. For Sleep  . Multiple Vitamin (MULTIVITAMIN) capsule Take 1 capsule by mouth daily.  . NON FORMULARY Diet: _____ Regular, ___x___ NAS, _______Consistent Carbohydrate, _______NPO __x___Other Low Potassium  . Nutritional  Supplements (NEPRO PO) Take 1 Can by mouth at bedtime.   No facility-administered encounter medications on file as of 05/23/2020.     SIGNIFICANT DIAGNOSTIC EXAMS   LABS REVIEWED PREVIOUS  10-13-19: hgb a1c 6.0; vit B 12: 1126 folate 20.9 10-17-19: wbc 8.8; hgb 8.3; hct 26.1; mcv 114.0 plt 270; glucose 190; bun 28; creat 4.85; k+ 3.9; na++ 127; ca 8.9 liver normal albumin 4.0  10-31-19: wbc 6.0; hgb 8.8; hct 28.6; mcv 117.2 plt 326; glucose 188; bun 45; creat 6.28; k+ 3.9; na++ 137;ca 9.1 liver normal albumin 3.9 11-07-19: wbc 6.1; hgb 9.2; hct 29.4 mcv 115.7 plt 276; glucose 201; bun 44; creat 5.93; k+ 4.5; na++ 136; ca 9.1 liver normal albumin 3.8 11-14-19; wbc 8.3; hgb 9.7; hct 31.2 mcv 116.4 plt 261; glucose 198; bun 44; creat 6.66; k+ 5.1; na++ 138; ca 8.8 liver normal albumin 3.9   12-12-19: wbc 7.6; hgb 11.3; hct 35.6; mcv 109.9 plt 212; glucose 602; bun 67; creat 8.62; k+ 5.6; na++ 135; ca 7.9 liver normal albumin 3.5  12-20-19: hgb a1c 8.0  01-09-20: wbc 6.9; hgb 12.1; hct 39.7; mcv 105.6 plt 299; glucose 225; bun 112; creat 14.53; k+ 6.4; na++ 132; ca 8.3 liver normal albumin 3.5  01-23-20: k+ 6.6  01-23-20: wbc 7.9; hgb 11.6; hct 36.9; mcv 103.7 plt 359; glucose 139; bun 109; creat 15.46; k+ 6.1; na++ 140; ca 8.7 liver normal albumin 4.1;  01-24-20: k+ 4.4  01-30-20: wbc 10.6; hgb 9.7; hct 32.0; mcv 103.6 plt 390; glucose 164; bun 44; creat 9.08; k+ 4.1; na++ 139; ca 9.0 liver normal albumin 3.3 02-06-20: wbc 7.8; hgb 9.9; hct 32.7 mcv 104.8 plt 344; glucose 211; bun 90; creat 10.97; k+ 6.0 na++ 137; ca 8.8 liver normal albumin 3.4  03-26-20: wbc 6.3; hgb 9.4; hct 30.4; mcv 104.1; plt 300; glucose 223; bun 101; creat 14.22; k+ 5.8; na++ 135; ca 8.1 GFR 2; liver normal albumin 3.4   TODAY  05-21-20; wbc 5.1; hgb 10.4; hct 32.8; mcv 106.8 plt 303; glucose 186; bun 41; creat 1.54; k+ 5.0; na++ 132; ca  8.4 GFR 5; liver normal albumin 3.7    Review of Systems  Constitutional: Negative for  malaise/fatigue.  Respiratory: Negative for cough and shortness of breath.   Cardiovascular: Negative for chest pain, palpitations and leg swelling.  Gastrointestinal: Negative for abdominal pain, constipation and heartburn.  Musculoskeletal: Negative for back pain, joint pain and myalgias.  Skin: Negative.   Neurological: Negative for dizziness.  Psychiatric/Behavioral: The patient is not nervous/anxious.      Physical Exam Constitutional:      General: She is not in acute distress.    Appearance: She is well-developed. She is not diaphoretic.  Neck:     Thyroid: No thyromegaly.  Cardiovascular:     Rate and Rhythm: Normal rate and regular rhythm.     Pulses: Normal pulses.     Heart sounds: Normal heart sounds.  Pulmonary:     Effort: Pulmonary effort is normal. No respiratory distress.     Breath sounds: Normal breath sounds.  Abdominal:     General: Bowel sounds are normal. There is no distension.     Palpations: Abdomen is soft.     Tenderness: There is no abdominal tenderness.  Musculoskeletal:        General: Normal range of motion.     Cervical back: Neck supple.     Right lower leg: No edema.     Left lower leg: No edema.  Lymphadenopathy:     Cervical: No cervical adenopathy.  Skin:    General: Skin is warm and dry.     Comments: Left knee dialysis access Right arm ace wrap off   Neurological:     Mental Status: She is alert. Mental status is at baseline.  Psychiatric:        Mood and Affect: Mood normal.      ASSESSMENT/ PLAN:  TODAY  1. Anemia due to end stage renal disease: is stable hgb 10.4; will monitor  2. Herpes: no reports of out breaks present will continue acyclovir 200 mg daily   3. Vascular dementia with behavioral disturbance: is without change weight is 146 pounds will monitor    PREVIOUS   4. Diabetes with end stage renal disease (ESRD) is stable hgb a1c 8.0 will continue novolog 10 units with breakfast and supper; 15 units at lunch  basaglar 10 units nightly   5. Aortic atherosclerosis/atherosclerotic peripheral vascular disease: is stable (10-04-18 scan) will monitor   6. Hyperkalemia: k+ 5.8 is without change will continue low potassium diet  7 Chronic kidney disease with end stage renal disease on dialysis due to type 2 diabetes mellitus/dependence on dialysis: will continue dialysis three days weekly; will continue phoshyra 667 mg 2 tabs twice daily and 1 with snacks.   8. Hypertension due to end stage renal disease de to type 2 diabetes mellitus: is stable b/p 112/60 will continue norvasc 10 mg four times weekly and clonidine 0.3 mg twice daily asa 81 mg daily   9. Stage 1 infiltrating ductal carcinoma of right female breast Is stable is followed by oncology will continue armidex 1 mg daily   10. multiple myeloma not achieving remission: is without change is followed by oncology.        Ok Edwards NP [facility]  Contact [phone] Monday through Friday 8am- 5pm  After hours call [phone]

## 2020-05-24 ENCOUNTER — Encounter (HOSPITAL_COMMUNITY): Payer: Self-pay | Admitting: Vascular Surgery

## 2020-05-24 DIAGNOSIS — Z992 Dependence on renal dialysis: Secondary | ICD-10-CM | POA: Diagnosis not present

## 2020-05-24 DIAGNOSIS — N186 End stage renal disease: Secondary | ICD-10-CM | POA: Diagnosis not present

## 2020-05-27 DIAGNOSIS — Z992 Dependence on renal dialysis: Secondary | ICD-10-CM | POA: Diagnosis not present

## 2020-05-27 DIAGNOSIS — N186 End stage renal disease: Secondary | ICD-10-CM | POA: Diagnosis not present

## 2020-05-28 ENCOUNTER — Inpatient Hospital Stay (HOSPITAL_COMMUNITY): Payer: Medicare PPO

## 2020-05-28 ENCOUNTER — Inpatient Hospital Stay (HOSPITAL_BASED_OUTPATIENT_CLINIC_OR_DEPARTMENT_OTHER): Payer: Medicare PPO | Admitting: Hematology

## 2020-05-28 VITALS — BP 91/57 | HR 74 | Temp 97.0°F | Resp 17 | Wt 140.4 lb

## 2020-05-28 DIAGNOSIS — C9000 Multiple myeloma not having achieved remission: Secondary | ICD-10-CM | POA: Diagnosis not present

## 2020-05-28 DIAGNOSIS — C9001 Multiple myeloma in remission: Secondary | ICD-10-CM

## 2020-05-28 DIAGNOSIS — E119 Type 2 diabetes mellitus without complications: Secondary | ICD-10-CM | POA: Diagnosis not present

## 2020-05-28 DIAGNOSIS — Z9011 Acquired absence of right breast and nipple: Secondary | ICD-10-CM | POA: Diagnosis not present

## 2020-05-28 DIAGNOSIS — C50911 Malignant neoplasm of unspecified site of right female breast: Secondary | ICD-10-CM | POA: Diagnosis not present

## 2020-05-28 DIAGNOSIS — Z992 Dependence on renal dialysis: Secondary | ICD-10-CM | POA: Diagnosis not present

## 2020-05-28 DIAGNOSIS — Z79810 Long term (current) use of selective estrogen receptor modulators (SERMs): Secondary | ICD-10-CM | POA: Diagnosis not present

## 2020-05-28 DIAGNOSIS — Z5111 Encounter for antineoplastic chemotherapy: Secondary | ICD-10-CM | POA: Diagnosis not present

## 2020-05-28 DIAGNOSIS — F0390 Unspecified dementia without behavioral disturbance: Secondary | ICD-10-CM | POA: Diagnosis not present

## 2020-05-28 DIAGNOSIS — N186 End stage renal disease: Secondary | ICD-10-CM | POA: Diagnosis not present

## 2020-05-28 LAB — CBC WITH DIFFERENTIAL/PLATELET
Abs Immature Granulocytes: 0.02 10*3/uL (ref 0.00–0.07)
Basophils Absolute: 0 10*3/uL (ref 0.0–0.1)
Basophils Relative: 1 %
Eosinophils Absolute: 0.2 10*3/uL (ref 0.0–0.5)
Eosinophils Relative: 3 %
HCT: 28.1 % — ABNORMAL LOW (ref 36.0–46.0)
Hemoglobin: 8.6 g/dL — ABNORMAL LOW (ref 12.0–15.0)
Immature Granulocytes: 0 %
Lymphocytes Relative: 16 %
Lymphs Abs: 1.1 10*3/uL (ref 0.7–4.0)
MCH: 33.2 pg (ref 26.0–34.0)
MCHC: 30.6 g/dL (ref 30.0–36.0)
MCV: 108.5 fL — ABNORMAL HIGH (ref 80.0–100.0)
Monocytes Absolute: 0.5 10*3/uL (ref 0.1–1.0)
Monocytes Relative: 8 %
Neutro Abs: 4.7 10*3/uL (ref 1.7–7.7)
Neutrophils Relative %: 72 %
Platelets: 223 10*3/uL (ref 150–400)
RBC: 2.59 MIL/uL — ABNORMAL LOW (ref 3.87–5.11)
RDW: 15.1 % (ref 11.5–15.5)
WBC: 6.5 10*3/uL (ref 4.0–10.5)
nRBC: 0 % (ref 0.0–0.2)

## 2020-05-28 LAB — COMPREHENSIVE METABOLIC PANEL
ALT: 10 U/L (ref 0–44)
AST: 19 U/L (ref 15–41)
Albumin: 3.2 g/dL — ABNORMAL LOW (ref 3.5–5.0)
Alkaline Phosphatase: 69 U/L (ref 38–126)
Anion gap: 17 — ABNORMAL HIGH (ref 5–15)
BUN: 45 mg/dL — ABNORMAL HIGH (ref 8–23)
CO2: 22 mmol/L (ref 22–32)
Calcium: 7.5 mg/dL — ABNORMAL LOW (ref 8.9–10.3)
Chloride: 94 mmol/L — ABNORMAL LOW (ref 98–111)
Creatinine, Ser: 7.89 mg/dL — ABNORMAL HIGH (ref 0.44–1.00)
GFR, Estimated: 5 mL/min — ABNORMAL LOW (ref >60)
Glucose, Bld: 268 mg/dL — ABNORMAL HIGH (ref 70–99)
Potassium: 5 mmol/L (ref 3.5–5.1)
Sodium: 133 mmol/L — ABNORMAL LOW (ref 135–145)
Total Bilirubin: 0.3 mg/dL (ref 0.3–1.2)
Total Protein: 6 g/dL — ABNORMAL LOW (ref 6.5–8.1)

## 2020-05-28 LAB — LACTATE DEHYDROGENASE: LDH: 155 U/L (ref 98–192)

## 2020-05-28 MED ORDER — DEXAMETHASONE 4 MG PO TABS
10.0000 mg | ORAL_TABLET | Freq: Once | ORAL | Status: AC
  Administered 2020-05-28: 10 mg via ORAL
  Filled 2020-05-28: qty 3

## 2020-05-28 MED ORDER — PROCHLORPERAZINE MALEATE 10 MG PO TABS
10.0000 mg | ORAL_TABLET | Freq: Once | ORAL | Status: AC
Start: 2020-05-28 — End: 2020-05-28
  Administered 2020-05-28: 10 mg via ORAL
  Filled 2020-05-28: qty 1

## 2020-05-28 MED ORDER — BORTEZOMIB CHEMO SQ INJECTION 3.5 MG (2.5MG/ML)
1.3000 mg/m2 | Freq: Once | INTRAMUSCULAR | Status: AC
  Administered 2020-05-28: 2.25 mg via SUBCUTANEOUS
  Filled 2020-05-28: qty 0.9

## 2020-05-28 NOTE — Progress Notes (Signed)
Patient was assessed by Dr. Katragadda and labs have been reviewed.  Patient is okay to proceed with treatment today. Primary RN and pharmacy aware.   

## 2020-05-28 NOTE — Progress Notes (Signed)
Carly Wood, [place] [address]   CLINIC:  Medical Oncology/Hematology  PCP:  Gerlene Fee, NP [address] [place] [address] [phone]   REASON FOR VISIT:  Follow-up for plasma cell myeloma and anemia  PRIOR THERAPY: None  NGS Results: Not done  CURRENT THERAPY: Velcade & Decadron 3/4 weeks  BRIEF ONCOLOGIC HISTORY:  Oncology History  Stage 1 infiltrating ductal carcinoma of right female breast ([place])  09/12/2014 Mammogram   Mass in upper outer R breast, middle third depth appears slightly larger than it was on prior exam with more irreg spiculated margins   10/02/2014 Pathology Results   biopsy with invasive ductal carcinoma high grade 1.1 cm, dcis solid type. additional R breast tissue, excision, invasive ductal high grade 0.9 cm    10/24/2014 Pathology Results   no residual invasive carcinoma, DCIS, focal, atypical ductal hyperplasia, 0/7 LN positive for metastatic carcinoma ER > 90%, PR 30%, HER 2 2+   10/24/2014 Cancer Staging   T1cN0M0   10/24/2014 Surgery   R mastectomy, T1c, N0   12/28/2014 - 11/22/2015 Chemotherapy   Taxol/herceptin weekly X 12, Herceptin every 21 days, last due in September 2017   03/11/2015 -  Anti-estrogen oral therapy   Arimidex 1 mg daily   09/12/2015 Imaging   MUGA- The left ventricular ejection fraction equals 68%.   06/08/2016 Treatment Plan Change   Started Nerlynx   Multiple myeloma ([place])  10/04/2018 Initial Diagnosis   Multiple myeloma ([place])   10/11/2018 -  Chemotherapy    Patient is on Treatment Plan: MYELOMA NON-TRANSPLANT CANDIDATES VRD WEEKLY D21D         CANCER STAGING: Cancer Staging Stage 1 infiltrating ductal carcinoma of right female breast [facility]) Staging form: Breast, AJCC 7th Edition - Clinical stage from 08/30/2015: Stage IA (T1c, N0, M0) - Signed by Baird Cancer, PA-C on 08/30/2015   INTERVAL HISTORY:  Ms. Carly Wood, a 85 y.o. female, returns for routine  follow-up and consideration for next cycle of chemotherapy. Carly Wood was last seen on 04/02/2020.  Due for day #8 of cycle #22 of Velcade today.   Overall, she tells me she has been feeling pretty well. She continues going to HD on M/W/F and is tolerating it well. She is tolerating Velcade well. She complains of having numbness in her right fingers since having surgery and having her arm wrapped. She denies having melena, hematochezia, and she is producing about a cup of urine at a time. Her right ankle pain is improving.  Overall, she feels ready for next cycle of chemo today.    REVIEW OF SYSTEMS:  Review of Systems  Constitutional: Positive for fatigue (75%). Negative for appetite change.  Gastrointestinal: Negative for blood in stool.  Musculoskeletal: Positive for arthralgias (ankle pain improving).  Neurological: Positive for numbness (R fingers post surgery).  All other systems reviewed and are negative.   PAST MEDICAL/SURGICAL HISTORY:  Past Medical History:  Diagnosis Date  . Anemia   . Anxiety   . Chronic kidney disease   . Chronic renal disease, stage 4, severely decreased glomerular filtration rate (GFR) between 15-29 mL/min/1.73 square meter (HCC) 08/22/2015  . Complication of anesthesia    delirious after Breast Surgery  . Dementia ([place])    mild  . Depression   . Diabetes mellitus without complication ([place])    type II  . Dyspnea    with activity  . GERD (gastroesophageal reflux disease)   .  Glaucoma   . Hypertension   . Pneumonia   . Stage 1 infiltrating ductal carcinoma of right female breast ([place]) 08/21/2015   ER+ PR+ HER 2 neu + (3+) T1cN0    Past Surgical History:  Procedure Laterality Date  . AV FISTULA PLACEMENT Left 11/22/2017   Procedure: ARTERIOVENOUS (AV) FISTULA CREATION LEFT ARM;  Surgeon: Elam Dutch, MD;  Location: [facility] OR;  Service: Vascular;  Laterality: Left;  . AV FISTULA PLACEMENT Right 04/04/2020   Procedure: RIGHT ARM ARTERIOVENOUS FISTULA  CREATION;  Surgeon: Rosetta Posner, MD;  Location[facility] ORS;  Service: Vascular;  Laterality: Right;  . BIOPSY  08/07/2016   Procedure: BIOPSY;  Surgeon: Daneil Dolin, MD;  Location[facility] ENDO SUITE;  Service: Endoscopy;;  gastric ulcer biopsy  . COLONOSCOPY    . ESOPHAGOGASTRODUODENOSCOPY N/A 08/07/2016   Procedure: ESOPHAGOGASTRODUODENOSCOPY (EGD);  Surgeon: Daneil Dolin, MD;  [facility] END[facility];  Service: Endoscopy;  Laterality: N/A;  1215-rescheduled to 6/1 @ 2:30pm per Tretha Sciara  . ESOPHAGOGASTRODUODENOSCOPY N/A 11/27/2016   Procedure: ESOPHAGOGASTRODUODENOSCOPY (EGD);  Surgeon: Daneil Dolin, MD;  [facility];  Service: Endoscopy;  Laterality: N/A;  8:15am  . FISTULA SUPERFICIALIZATION Left 02/14/2018   Procedure: FISTULA SUPERFICIALIZATION LEFT ARM;  Surgeon: Angelia Mould, MD;  Location: [place];  Service: Vascular;  Laterality: Left;  . FRACTURE SURGERY Right    ankle  . MALONEY DILATION N/A 08/07/2016   Procedure: Venia Minks DILATION;  Surgeon: Daneil Dolin, MD;  [facility];  Service: Endoscopy;  Laterality: N/A;  . MASTECTOMY, PARTIAL Right   . PERIPHERAL VASCULAR BALLOON ANGIOPLASTY Left 07/13/2019   Procedure: PERIPHERAL VASCULAR BALLOON ANGIOPLASTY;  Surgeon: Marty Heck, MD;  Location: [place] CV LAB;  Service: Cardiovascular;  Laterality: Left;  arm fistulogram  . PERIPHERAL VASCULAR BALLOON ANGIOPLASTY Right 05/22/2020   Procedure: PERIPHERAL VASCULAR BALLOON ANGIOPLASTY;  Surgeon: Cherre Robins, MD;  Location: [place] CV LAB;  Service: Cardiovascular;  Laterality: Right;  arm fistula  . PORT-A-CATH REMOVAL Left 11/22/2017   Procedure: REMOVAL PORT-A-CATH LEFT CHEST;  Surgeon: Elam Dutch, MD;  Location: [facility];  Service: Vascular;  Laterality: Left;  . RETINAL DETACHMENT SURGERY Right     SOCIAL HISTORY:  Social History   Socioeconomic History  . Marital status: Single    Spouse name: Not on file  . Number of children:  Not on file  . Years of education: Not on file  . Highest education level: Not on file  Occupational History  . Occupation: retired   Tobacco Use  . Smoking status: Never Smoker  . Smokeless tobacco: Never Used  Vaping Use  . Vaping Use: Never used  Substance and Sexual Activity  . Alcohol use: No    Alcohol/week: 0.0 standard drinks  . Drug use: No  . Sexual activity: Never  Other Topics Concern  . Not on file  Social History Narrative   Long term resident of SNF    Social Determinants of Health   Financial Resource Strain: Low Risk   . Difficulty of Paying Living Expenses: Not very hard  Food Insecurity: No Food Insecurity  . Worried About Charity fundraiser in the Last Year: Never true  . Ran Out of Food in the Last Year: Never true  Transportation Needs: No Transportation Needs  . Lack of Transportation (Medical): No  . Lack of Transportation (Non-Medical): No  Physical Activity: Inactive  . Days of Exercise per Week:  0 days  . Minutes of Exercise per Session: 0 min  Stress: No Stress Concern Present  . Feeling of Stress : Not at all  Social Connections: Moderately Isolated  . Frequency of Communication with Friends and Family: More than three times a week  . Frequency of Social Gatherings with Friends and Family: Once a week  . Attends Religious Services: More than 4 times per year  . Active Member of Clubs or Organizations: No  . Attends Archivist Meetings: Never  . Marital Status: Never married  Intimate Partner Violence: Not At Risk  . Fear of Current or Ex-Partner: No  . Emotionally Abused: No  . Physically Abused: No  . Sexually Abused: No    FAMILY HISTORY:  Family History  Problem Relation Age of Onset  . Multiple myeloma Sister   . Brain cancer Sister   . Dementia Mother        died at 36  . Stroke Mother   . Heart failure Mother   . Diabetes Mother   . Heart disease Father   . Prostate cancer Brother   . Colon cancer Neg Hx      CURRENT MEDICATIONS:  No current outpatient medications on file.   No current facility-administered medications for this visit.    ALLERGIES:  Allergies  Allergen Reactions  . Ace Inhibitors Other (See Comments) and Cough    Tongue swell  . Penicillins Other (See Comments)    Unsure of reaction Has patient had a PCN reaction causing immediate rash, facial/tongue/throat swelling, SOB or lightheadedness with hypotension: Unknown Has patient had a PCN reaction causing severe rash involving mucus membranes or skin necrosis: Unknown Has patient had a PCN reaction that required hospitalization: No Has patient had a PCN reaction occurring within the last 10 years: Unknown If all of the above answers are "NO", then may proceed with Cephalosporin use.      PHYSICAL EXAM:  Performance status (ECOG): 2 - Symptomatic, <50% confined to bed  Vitals:   05/28/20 1030  BP: (!) 91/57  Pulse: 74  Resp: 17  Temp: (!) 97 F (36.1 C)  SpO2: 96%   Wt Readings from Last 3 Encounters:  05/28/20 140 lb 6.9 oz (63.7 kg)  05/23/20 146 lb 9.6 oz (66.5 kg)  05/22/20 140 lb 12.8 oz (63.9 kg)   Physical Exam Vitals reviewed.  Constitutional:      Appearance: Normal appearance.     Comments: Wheelchair-bound  Cardiovascular:     Rate and Rhythm: Normal rate and regular rhythm.     Pulses: Normal pulses.     Heart sounds: Normal heart sounds.  Pulmonary:     Effort: Pulmonary effort is normal.     Breath sounds: Normal breath sounds.  Musculoskeletal:     Right lower leg: No edema.     Left lower leg: No edema.  Neurological:     General: No focal deficit present.     Mental Status: She is alert and oriented to person, place, and time.  Psychiatric:        Mood and Affect: Mood normal.        Behavior: Behavior normal.     LABORATORY DATA:  I have reviewed the labs as listed.  CBC Latest Ref Rng & Units 05/28/2020 05/21/2020 04/30/2020  WBC 4.0 - 10.5 K/uL 6.5 5.1 6.6  Hemoglobin  12.0 - 15.0 g/dL 8.6(L) 10.4(L) 10.6(L)  Hematocrit 36.0 - 46.0 % 28.1(L) 32.8(L) 34.6(L)  Platelets 150 -  400 K/uL 223 303 244   CMP Latest Ref Rng & Units 05/28/2020 05/21/2020 04/30/2020  Glucose 70 - 99 mg/dL 268(H) 186(H) 254(H)  BUN 8 - 23 mg/dL 45(H) 41(H) 38(H)  Creatinine 0.44 - 1.00 mg/dL 7.89(H) 7.54(H) 7.07(H)  Sodium 135 - 145 mmol/L 133(L) 132(L) 133(L)  Potassium 3.5 - 5.1 mmol/L 5.0 5.0 4.6  Chloride 98 - 111 mmol/L 94(L) 90(L) 91(L)  CO2 22 - 32 mmol/L _0 Calcium 8.9 - 10.3 mg/dL 7.5(L) 8.4(L) 8.4(L)  Total Protein 6.5 - 8.1 g/dL 6.0(L) 7.1 6.7  Total Bilirubin 0.3 - 1.2 mg/dL 0.3 0.2(L) 0.5  Alkaline Phos 38 - 126 U/L 69 74 77  AST 15 - 41 U/L _1 ALT 0 - 44 U/L _2 Lab Results  Component Value Date   LDH 155 05/28/2020   LDH 150 05/21/2020   LDH 153 04/16/2020    DIAGNOSTIC IMAGING:  I have independently reviewed the scans and discussed with the patient. PERIPHERAL VASCULAR CATHETERIZATION  Result Date: 05/22/2020 DATE OF SERVICE: 05/22/2020  PATIENT:  Carly Wood  85 y.o. female  PRE-OPERATIVE DIAGNOSIS:  Severe right upper extremity swelling after brachiocephalic AVF creation; RIJ TDC in place  POST-OPERATIVE DIAGNOSIS:  Same  PROCEDURE:  1) TDC removal 2) LIJ TDC placement (23cm catheter) 3) US guided access of RUE AVF 4) Fistulagram 5) Central venogram (27m total contrast) 6) Angioplasty of right innominate vein / superior vena cava occlusion (14x450mAtlas)  SURGEON:  Surgeon(s) and Role:    * HaCherre RobinsMD - Primary  ASSISTANT: none  An assistant was required to facilitate exposure and expedite the case.  ANESTHESIA:   local  EBL: min  BLOOD ADMINISTERED:none  DRAINS: none  LOCAL MEDICATIONS USED:  LIDOCAINE  SPECIMEN:  none  COUNTS: confirmed correct.  TOURNIQUET:  None  PATIENT DISPOSITION:  PACU - hemodynamically stable.  Delay start of Pharmacological VTE agent (>24hrs) due to surgical blood loss or risk of  bleeding: no  INDICATION FOR PROCEDURE: BeLILLYAN HITSONs a [age].o. female with severe swelling of right upper extremity about 1.5 months after right brachiocephalic arteriovenous fistula creation. She had a right IJ TDC placed as well. After careful discussion of risks, benefits, and alternatives the patient was offered TDEfthemios Raphtis Md Pcxchange and fistulagram. The patient  understood and wished to proceed.  OPERATIVE FINDINGS: Unremarkable TDC removal Unremarkable LIJ TDC placement Right innominate vein / superior vena cava confluence occluded Able to cross the occlusion from AVF access Angioplasty restored patency of occlusion, still about 40% residual stenosis  DESCRIPTION OF PROCEDURE: After identification of the patient in the pre-operative holding area, the patient was transferred to the operating room. The patient was positioned supine on the operating room table. Anesthesia was induced. The groins was prepped and draped in standard fashion. A surgical pause was performed confirming correct patient, procedure, and operative location.  The catheter exit site in the right chest was prepped and draped in usual sterile fashion.  After infiltration of 1% lidocaine around the felt cuff of the catheter.  I dissected the catheter free from the subcutaneous tissue using combination of blunt and sharp dissection.  Catheter was ultimately freed at the felt cuff.  It was removed easily intact.  A bandage was applied to the catheter exit site.  Manual pressure was held to the neck for several minutes.  Hemostasis was achieved.  Using ultrasound guidance the left internal jugular vein was  accessed with micropuncture technique.  Through the micropuncture sheath a floppy J-wire was advanced into the superior vena cava.  A small incision was made around the skin access point.  The access point was serially dilated under direct fluoroscopic guidance.  A peel-away sheath was introduced into the superior vena cava under fluoroscopic  guidance.  A counterincision was made in the chest under the clavicle.  A 23 cm tunnel dialysis catheter was then tunneled under the skin, over the clavicle into the incision in the neck.  The tunneling device was removed and the catheter fed through the peel-away sheath into the superior vena cava.  The peel-away sheath was removed and the catheter gently pulled back.  Adequate position was confirmed with x-ray.  The catheter was tested and found to flush and draw back well.  Catheter was heparin locked.  Caps were applied.  Catheter was sutured to the skin.  The neck incision was closed with 4-0 Monocryl.  Using ultrasound guidance, the right brachiocephalic AV fistula was accessed using micropuncture technique.  We upsized to 7 [place] sheath.  Fistulogram was performed.  This revealed extreme filling of collaterals about the shoulder.  An occlusion was noted at the confluence of the left innominate vein and superior vena cava.  Angioplasty this first with a 12 x 60 Mustang balloon, then with a 14 x 40 Atlas balloon.  Some residual stenosis was noted, but the patient could not tolerate more angioplasty.  Wire and balloon was removed a pursestring suture was placed around the access site and secured after removing the sheath.  IMA stasis was achieved.  Clean bandages were applied to all surgical sites.  Upon completion of the case instrument and sharps counts were confirmed correct. The patient was transferred to the PACU in good condition. I was present for all portions of the procedure.  PLAN: Follow-up in 2 weeks in [place] clinic with repeat AV fistula duplex.  If arm swelling improved, may be able to transition to using AV fistula.  Use tunneled dialysis catheter for now.  [name]. Stanford Breed, MD [facility] Phone Number: [phone] 05/22/2020 2:28 PM     ASSESSMENT:  1. IgA lambda plasma cell myeloma, stage II, standard risk: -BMBX on 09/20/2018 shows plasma cell  myeloma, 30% plasma cells. FISH panel was normal. Chromosome analysis normal. -Labs at diagnosis on 08/29/2018 with M spike 0.9 g. Kappa light chains 148, lambda light chains 1132, ratio 0.13. LDH normal. Beta-2 microglobulin 13.5. She had transfusion dependent anemia. -Velcade and dexamethasone started on 10/11/2018. -Myeloma panel on 07/11/2019 shows SPEP is negative. Free light chain ratio improved to 1.09. Lambda light chains are 128, improved from 141. Kappa light chains are 140. -She has been resident at [facility] since 10/12/2019.  2. Stage I IDC of the right breast, ER/PR positive, HER-2 negative: -She is on anastrozole.   PLAN:  1. IgA lambda plasma cell myeloma: -She is tolerating Velcade on days 1, 8, 15 every 21 days with dexamethasone 10 mg on the days of treatment.  Denies any neuropathy. -Reviewed her labs from 05/21/2020.  Lambda light chains has increased slightly to 267 from 195.  Ratio is stable at 0.77.  SPEP showed no M spike. -She has anemia with hemoglobin 8.6 and MCV 108.  She is receiving erythropoiesis stimulating agents with dialysis. -We will continue the same management at this time.  We will see her back in 8 weeks for follow-up with repeat myeloma labs.  2. ESRD on HD: -Continue HD on Monday, Wednesday and Friday.  3. Bone strengthening: -Bisphosphonates were not started due to dialysis.  4. Osteopenia: -Bone density in 2017 was osteopenic.  Plan to repeat DEXA scan in the future.  5. Right breast cancer: -Continue anastrozole.   Orders placed this encounter:  No orders of the defined types were placed in this encounter.    Derek Jack, MD [place] [phone]   I, Milinda Antis, am acting as a scribe for Dr. Sanda Linger.  I, Derek Jack MD, have reviewed the above documentation for accuracy and completeness, and I agree with the above.

## 2020-05-28 NOTE — Patient Instructions (Signed)
[facility] at [facility] Discharge Instructions  You were seen today by Dr. Katragadda. He went over your recent results. You received your treatment today; continue getting your treatment every week. Dr. Katragadda will see you back in 2 months for labs and follow up.   Thank you for choosing [facility] at [facility] to provide your oncology and hematology care.  To afford each patient quality time with our provider, please arrive at least 15 minutes before your scheduled appointment time.   If you have a lab appointment with the Cancer Center please come in thru the Main Entrance and check in at the main information desk  You need to re-schedule your appointment should you arrive 10 or more minutes late.  We strive to give you quality time with our providers, and arriving late affects you and other patients whose appointments are after yours.  Also, if you no show three or more times for appointments you may be dismissed from the clinic at the providers discretion.     Again, thank you for choosing [facility].  Our hope is that these requests will decrease the amount of time that you wait before being seen by our physicians.       _____________________________________________________________  Should you have questions after your visit to  Cancer Center, please contact our office at [phone] between the hours of 8:00 a.m. and 4:30 p.m.  Voicemails left after 4:00 p.m. will not be returned until the following business day.  For prescription refill requests, have your pharmacy contact our office and allow 72 hours.    Cancer Center Support Programs:   > Cancer Support Group  2nd Tuesday of the month 1pm-2pm, Journey Room    

## 2020-05-28 NOTE — Progress Notes (Signed)
Patient tolerated Velcade injection with no complaints voiced. Lab work reviewed. See MAR for details. Injection site clean and dry with no bruising or swelling noted. Patient stable during and after injection. Band aid applied. VSS. Patient left in satisfactory condition with no s/s of distress noted. 

## 2020-05-28 NOTE — Patient Instructions (Signed)
[facility] at [facility]  Discharge Instructions:   _______________________________________________________________  Thank you for choosing [facility] at [facility] to provide your oncology and hematology care.  To afford each patient quality time with our providers, please arrive at least 15 minutes before your scheduled appointment.  You need to re-schedule your appointment if you arrive 10 or more minutes late.  We strive to give you quality time with our providers, and arriving late affects you and other patients whose appointments are after yours.  Also, if you no show three or more times for appointments you may be dismissed from the clinic.  Again, thank you for choosing  Can[facility] Center at [facility]. Our hope is that these requests will allow you access to exceptional care and in a timely manner. _______________________________________________________________  If you have questions after your visit, please contact our office at [phone] between the hours of 8:30 a.m. and 5:00 p.m. Voicemails left after 4:30 p.m. will not be returned until the following business day. _______________________________________________________________  For prescription refill requests, have your pharmacy contact our office. _______________________________________________________________  Recommendations made by the consultant and any test results will be sent to your referring physician. _______________________________________________________________ 

## 2020-05-29 DIAGNOSIS — N186 End stage renal disease: Secondary | ICD-10-CM | POA: Diagnosis not present

## 2020-05-29 DIAGNOSIS — Z992 Dependence on renal dialysis: Secondary | ICD-10-CM | POA: Diagnosis not present

## 2020-05-29 LAB — KAPPA/LAMBDA LIGHT CHAINS
Kappa free light chain: 123.9 mg/L — ABNORMAL HIGH (ref 3.3–19.4)
Kappa, lambda light chain ratio: 0.74 (ref 0.26–1.65)
Lambda free light chains: 167.9 mg/L — ABNORMAL HIGH (ref 5.7–26.3)

## 2020-05-30 LAB — PROTEIN ELECTROPHORESIS, SERUM
A/G Ratio: 1.4 (ref 0.7–1.7)
Albumin ELP: 3.3 g/dL (ref 2.9–4.4)
Alpha-1-Globulin: 0.3 g/dL (ref 0.0–0.4)
Alpha-2-Globulin: 0.8 g/dL (ref 0.4–1.0)
Beta Globulin: 0.8 g/dL (ref 0.7–1.3)
Gamma Globulin: 0.5 g/dL (ref 0.4–1.8)
Globulin, Total: 2.4 g/dL (ref 2.2–3.9)
Total Protein ELP: 5.7 g/dL — ABNORMAL LOW (ref 6.0–8.5)

## 2020-05-31 DIAGNOSIS — Z992 Dependence on renal dialysis: Secondary | ICD-10-CM | POA: Diagnosis not present

## 2020-05-31 DIAGNOSIS — N186 End stage renal disease: Secondary | ICD-10-CM | POA: Diagnosis not present

## 2020-05-31 LAB — IMMUNOFIXATION ELECTROPHORESIS
IgA: 190 mg/dL (ref 64–422)
IgG (Immunoglobin G), Serum: 634 mg/dL (ref 586–1602)
IgM (Immunoglobulin M), Srm: 8 mg/dL — ABNORMAL LOW (ref 26–217)
Total Protein ELP: 5.9 g/dL — ABNORMAL LOW (ref 6.0–8.5)

## 2020-06-03 ENCOUNTER — Other Ambulatory Visit (HOSPITAL_COMMUNITY): Payer: Self-pay

## 2020-06-03 DIAGNOSIS — N186 End stage renal disease: Secondary | ICD-10-CM | POA: Diagnosis not present

## 2020-06-03 DIAGNOSIS — E119 Type 2 diabetes mellitus without complications: Secondary | ICD-10-CM | POA: Diagnosis not present

## 2020-06-03 DIAGNOSIS — Z992 Dependence on renal dialysis: Secondary | ICD-10-CM | POA: Diagnosis not present

## 2020-06-03 DIAGNOSIS — C9000 Multiple myeloma not having achieved remission: Secondary | ICD-10-CM

## 2020-06-03 DIAGNOSIS — C9001 Multiple myeloma in remission: Secondary | ICD-10-CM

## 2020-06-04 ENCOUNTER — Encounter (HOSPITAL_COMMUNITY): Payer: Self-pay

## 2020-06-04 ENCOUNTER — Inpatient Hospital Stay (HOSPITAL_COMMUNITY): Payer: Medicare PPO

## 2020-06-04 VITALS — BP 94/47 | HR 76 | Temp 96.8°F | Resp 18 | Wt 146.8 lb

## 2020-06-04 DIAGNOSIS — Z5111 Encounter for antineoplastic chemotherapy: Secondary | ICD-10-CM | POA: Diagnosis not present

## 2020-06-04 DIAGNOSIS — E119 Type 2 diabetes mellitus without complications: Secondary | ICD-10-CM | POA: Diagnosis not present

## 2020-06-04 DIAGNOSIS — F0390 Unspecified dementia without behavioral disturbance: Secondary | ICD-10-CM | POA: Diagnosis not present

## 2020-06-04 DIAGNOSIS — C9000 Multiple myeloma not having achieved remission: Secondary | ICD-10-CM

## 2020-06-04 DIAGNOSIS — Z992 Dependence on renal dialysis: Secondary | ICD-10-CM | POA: Diagnosis not present

## 2020-06-04 DIAGNOSIS — N186 End stage renal disease: Secondary | ICD-10-CM | POA: Diagnosis not present

## 2020-06-04 DIAGNOSIS — Z9011 Acquired absence of right breast and nipple: Secondary | ICD-10-CM | POA: Diagnosis not present

## 2020-06-04 DIAGNOSIS — C50911 Malignant neoplasm of unspecified site of right female breast: Secondary | ICD-10-CM | POA: Diagnosis not present

## 2020-06-04 DIAGNOSIS — Z79810 Long term (current) use of selective estrogen receptor modulators (SERMs): Secondary | ICD-10-CM | POA: Diagnosis not present

## 2020-06-04 LAB — CBC WITH DIFFERENTIAL/PLATELET
Abs Immature Granulocytes: 0.02 10*3/uL (ref 0.00–0.07)
Basophils Absolute: 0.1 10*3/uL (ref 0.0–0.1)
Basophils Relative: 1 %
Eosinophils Absolute: 0.3 10*3/uL (ref 0.0–0.5)
Eosinophils Relative: 4 %
HCT: 30.7 % — ABNORMAL LOW (ref 36.0–46.0)
Hemoglobin: 9.6 g/dL — ABNORMAL LOW (ref 12.0–15.0)
Immature Granulocytes: 0 %
Lymphocytes Relative: 24 %
Lymphs Abs: 1.5 10*3/uL (ref 0.7–4.0)
MCH: 33.3 pg (ref 26.0–34.0)
MCHC: 31.3 g/dL (ref 30.0–36.0)
MCV: 106.6 fL — ABNORMAL HIGH (ref 80.0–100.0)
Monocytes Absolute: 0.7 10*3/uL (ref 0.1–1.0)
Monocytes Relative: 11 %
Neutro Abs: 3.9 10*3/uL (ref 1.7–7.7)
Neutrophils Relative %: 60 %
Platelets: 278 10*3/uL (ref 150–400)
RBC: 2.88 MIL/uL — ABNORMAL LOW (ref 3.87–5.11)
RDW: 14.6 % (ref 11.5–15.5)
WBC: 6.5 10*3/uL (ref 4.0–10.5)
nRBC: 0 % (ref 0.0–0.2)

## 2020-06-04 LAB — COMPREHENSIVE METABOLIC PANEL
ALT: 11 U/L (ref 0–44)
AST: 19 U/L (ref 15–41)
Albumin: 3.6 g/dL (ref 3.5–5.0)
Alkaline Phosphatase: 63 U/L (ref 38–126)
Anion gap: 15 (ref 5–15)
BUN: 35 mg/dL — ABNORMAL HIGH (ref 8–23)
CO2: 26 mmol/L (ref 22–32)
Calcium: 7.8 mg/dL — ABNORMAL LOW (ref 8.9–10.3)
Chloride: 94 mmol/L — ABNORMAL LOW (ref 98–111)
Creatinine, Ser: 7.45 mg/dL — ABNORMAL HIGH (ref 0.44–1.00)
GFR, Estimated: 5 mL/min — ABNORMAL LOW (ref >60)
Glucose, Bld: 237 mg/dL — ABNORMAL HIGH (ref 70–99)
Potassium: 5 mmol/L (ref 3.5–5.1)
Sodium: 135 mmol/L (ref 135–145)
Total Bilirubin: 0.2 mg/dL — ABNORMAL LOW (ref 0.3–1.2)
Total Protein: 6.6 g/dL (ref 6.5–8.1)

## 2020-06-04 MED ORDER — BORTEZOMIB CHEMO SQ INJECTION 3.5 MG (2.5MG/ML)
1.3000 mg/m2 | Freq: Once | INTRAMUSCULAR | Status: AC
Start: 2020-06-04 — End: 2020-06-04
  Administered 2020-06-04: 2.25 mg via SUBCUTANEOUS
  Filled 2020-06-04: qty 0.9

## 2020-06-04 MED ORDER — PROCHLORPERAZINE MALEATE 10 MG PO TABS
10.0000 mg | ORAL_TABLET | Freq: Once | ORAL | Status: AC
  Administered 2020-06-04: 10 mg via ORAL
  Filled 2020-06-04: qty 1

## 2020-06-04 MED ORDER — DEXAMETHASONE 4 MG PO TABS
10.0000 mg | ORAL_TABLET | Freq: Once | ORAL | Status: AC
  Administered 2020-06-04: 10 mg via ORAL
  Filled 2020-06-04: qty 3

## 2020-06-04 NOTE — Progress Notes (Signed)
Patient tolerated Velcade injection with no complaints voiced. Lab work reviewed. See MAR for details. Injection site clean and dry with no bruising or swelling noted. Patient stable during and after injection. Band aid applied. VSS. Patient left in satisfactory condition with no s/s of distress noted. 

## 2020-06-04 NOTE — Patient Instructions (Signed)
[facility] Discharge Instructions for Patients Receiving Chemotherapy  Today you received the following chemotherapy Velcade.    If you develop nausea and vomiting that is not controlled by your nausea medication, call the clinic.   BELOW ARE SYMPTOMS THAT SHOULD BE REPORTED IMMEDIATELY:  *FEVER GREATER THAN 100.5 F  *CHILLS WITH OR WITHOUT FEVER  NAUSEA AND VOMITING THAT IS NOT CONTROLLED WITH YOUR NAUSEA MEDICATION  *UNUSUAL SHORTNESS OF BREATH  *UNUSUAL BRUISING OR BLEEDING  TENDERNESS IN MOUTH AND THROAT WITH OR WITHOUT PRESENCE OF ULCERS  *URINARY PROBLEMS  *BOWEL PROBLEMS  UNUSUAL RASH Items with * indicate a potential emergency and should be followed up as soon as possible.  Feel free to call the clinic should you have any questions or concerns. The clinic phone number is [phone].  Please show the [place] at check-in to the Emergency Department and triage nurse.

## 2020-06-05 DIAGNOSIS — N186 End stage renal disease: Secondary | ICD-10-CM | POA: Diagnosis not present

## 2020-06-05 DIAGNOSIS — Z992 Dependence on renal dialysis: Secondary | ICD-10-CM | POA: Diagnosis not present

## 2020-06-06 DIAGNOSIS — N186 End stage renal disease: Secondary | ICD-10-CM | POA: Diagnosis not present

## 2020-06-06 DIAGNOSIS — Z992 Dependence on renal dialysis: Secondary | ICD-10-CM | POA: Diagnosis not present

## 2020-06-07 DIAGNOSIS — Z992 Dependence on renal dialysis: Secondary | ICD-10-CM | POA: Diagnosis not present

## 2020-06-07 DIAGNOSIS — N186 End stage renal disease: Secondary | ICD-10-CM | POA: Diagnosis not present

## 2020-06-08 ENCOUNTER — Other Ambulatory Visit: Payer: Self-pay

## 2020-06-08 DIAGNOSIS — N186 End stage renal disease: Secondary | ICD-10-CM

## 2020-06-10 ENCOUNTER — Encounter (HOSPITAL_COMMUNITY): Payer: Medicare PPO

## 2020-06-10 ENCOUNTER — Encounter: Payer: Medicare PPO | Admitting: Vascular Surgery

## 2020-06-10 DIAGNOSIS — Z992 Dependence on renal dialysis: Secondary | ICD-10-CM | POA: Diagnosis not present

## 2020-06-10 DIAGNOSIS — N186 End stage renal disease: Secondary | ICD-10-CM | POA: Diagnosis not present

## 2020-06-12 DIAGNOSIS — Z992 Dependence on renal dialysis: Secondary | ICD-10-CM | POA: Diagnosis not present

## 2020-06-12 DIAGNOSIS — N186 End stage renal disease: Secondary | ICD-10-CM | POA: Diagnosis not present

## 2020-06-13 ENCOUNTER — Other Ambulatory Visit (HOSPITAL_COMMUNITY)
Admission: RE | Admit: 2020-06-13 | Discharge: 2020-06-13 | Disposition: A | Discharge status: Home / Self Care | Payer: Medicare PPO | Source: Skilled Nursing Facility | Attending: Adult Health | Admitting: Adult Health

## 2020-06-13 DIAGNOSIS — E119 Type 2 diabetes mellitus without complications: Secondary | ICD-10-CM | POA: Insufficient documentation

## 2020-06-13 LAB — HEMOGLOBIN A1C
Hgb A1c MFr Bld: 6.2 % — ABNORMAL HIGH (ref 4.8–5.6)
Mean Plasma Glucose: 131.24 mg/dL

## 2020-06-13 LAB — LIPID PANEL
Cholesterol: 189 mg/dL (ref 0–200)
HDL: 41 mg/dL (ref >40)
LDL Cholesterol: 106 mg/dL — ABNORMAL HIGH (ref 0–99)
Total CHOL/HDL Ratio: 4.6 RATIO
Triglycerides: 211 mg/dL — ABNORMAL HIGH (ref <150)
VLDL: 42 mg/dL — ABNORMAL HIGH (ref 0–40)

## 2020-06-14 ENCOUNTER — Non-Acute Institutional Stay (SKILLED_NURSING_FACILITY): Payer: Medicare PPO | Admitting: Adult Health

## 2020-06-14 ENCOUNTER — Encounter: Payer: Self-pay | Admitting: Adult Health

## 2020-06-14 DIAGNOSIS — E785 Hyperlipidemia, unspecified: Secondary | ICD-10-CM | POA: Diagnosis not present

## 2020-06-14 DIAGNOSIS — E1122 Type 2 diabetes mellitus with diabetic chronic kidney disease: Secondary | ICD-10-CM | POA: Diagnosis not present

## 2020-06-14 DIAGNOSIS — N186 End stage renal disease: Secondary | ICD-10-CM | POA: Diagnosis not present

## 2020-06-14 DIAGNOSIS — Z992 Dependence on renal dialysis: Secondary | ICD-10-CM | POA: Diagnosis not present

## 2020-06-14 DIAGNOSIS — E1169 Type 2 diabetes mellitus with other specified complication: Secondary | ICD-10-CM | POA: Diagnosis not present

## 2020-06-14 NOTE — Progress Notes (Signed)
Location:  [facility] Room Number: 148/D Place of Service:  SNF (31)   CODE STATUS: Full Code  Allergies  Allergen Reactions  . Ace Inhibitors Other (See Comments) and Cough    Tongue swell  . Penicillins Other (See Comments)    Unsure of reaction Has patient had a PCN reaction causing immediate rash, facial/tongue/throat swelling, SOB or lightheadedness with hypotension: Unknown Has patient had a PCN reaction causing severe rash involving mucus membranes or skin necrosis: Unknown Has patient had a PCN reaction that required hospitalization: No Has patient had a PCN reaction occurring within the last 10 years: Unknown If all of the above answers are "NO", then may proceed with Cephalosporin use.      Chief Complaint  Patient presents with  . Acute Visit    Labs    HPI:  Her ldl is 106. She is presently not on a statin. She denies any changes in appetite; no uncontrolled pain; no muscle pains; no insomnia. Her cbg readings are good.   Past Medical History:  Diagnosis Date  . Anemia   . Anxiety   . Chronic kidney disease   . Chronic renal disease, stage 4, severely decreased glomerular filtration rate (GFR) between 15-29 mL/min/1.73 square meter (HCC) 08/22/2015  . Complication of anesthesia    delirious after Breast Surgery  . Dementia ([place])    mild  . Depression   . Diabetes mellitus without complication ([place])    type II  . Dyspnea    with activity  . GERD (gastroesophageal reflux disease)   . Glaucoma   . Hypertension   . Pneumonia   . Stage 1 infiltrating ductal carcinoma of right female breast ([place]) 08/21/2015   ER+ PR+ HER 2 neu + (3+) T1cN0     Past Surgical History:  Procedure Laterality Date  . AV FISTULA PLACEMENT Left 11/22/2017   Procedure: ARTERIOVENOUS (AV) FISTULA CREATION LEFT ARM;  Surgeon: Elam Dutch, MD;  Location: [facility] OR;  Service: Vascular;  Laterality: Left;  . AV FISTULA PLACEMENT Right 04/04/2020   Procedure:  RIGHT ARM ARTERIOVENOUS FISTULA CREATION;  Surgeon: Rosetta Posner, MD;  Location: [facility] ORS;  Service: Vascular;  Laterality: Right;  . BIOPSY  08/07/2016   Procedure: BIOPSY;  Surgeon: Daneil Dolin, MD;  Location: [facility] ENDO SUITE;  Service: Endoscopy;;  gastric ulcer biopsy  . COLONOSCOPY    . ESOPHAGOGASTRODUODENOSCOPY N/A 08/07/2016   Procedure: ESOPHAGOGASTRODUODENOSCOPY (EGD);  Surgeon: Daneil Dolin, MD;  [facility] END[facility];  Service: Endoscopy;  Laterality: N/A;  1215-rescheduled to 6/1 @ 2:30pm per Tretha Sciara  . ESOPHAGOGASTRODUODENOSCOPY N/A 11/27/2016   Procedure: ESOPHAGOGASTRODUODENOSCOPY (EGD);  Surgeon: Daneil Dolin, MD;  [facility];  Service: Endoscopy;  Laterality: N/A;  8:15am  . FISTULA SUPERFICIALIZATION Left 02/14/2018   Procedure: FISTULA SUPERFICIALIZATION LEFT ARM;  Surgeon: Angelia Mould, MD;  Location: [facility];  Service: Vascular;  Laterality: Left;  . FRACTURE SURGERY Right    ankle  . MALONEY DILATION N/A 08/07/2016   Procedure: Venia Minks DILATION;  Surgeon: Daneil Dolin, MD;  [facility];  Service: Endoscopy;  Laterality: N/A;  . MASTECTOMY, PARTIAL Right   . PERIPHERAL VASCULAR BALLOON ANGIOPLASTY Left 07/13/2019   Procedure: PERIPHERAL VASCULAR BALLOON ANGIOPLASTY;  Surgeon: Marty Heck, MD;  Location: [place] [facility] LAB;  Service: Cardiovascular;  Laterality: Left;  arm fistulogram  . PERIPHERAL VASCULAR BALLOON ANGIOPLASTY Right 05/22/2020   Procedure: PERIPHERAL VASCULAR BALLOON ANGIOPLASTY;  Surgeon: Cherre [facility], MD;  Location: [place] CV LAB;  Service: Cardiovascular;  Laterality: Right;  arm fistula  . PORT-A-CATH REMOVAL Left 11/22/2017   Procedure: REMOVAL PORT-A-CATH LEFT CHEST;  Surgeon: Elam Dutch, MD;  Location: [facility] OR;  Service: Vascular;  Laterality: Left;  . RETINAL DETACHMENT SURGERY Right     Social History   Socioeconomic History  . Marital status: Single    Spouse name: Not on file  .  Number of children: Not on file  . Years of education: Not on file  . Highest education level: Not on file  Occupational History  . Occupation: retired   Tobacco Use  . Smoking status: Never Smoker  . Smokeless tobacco: Never Used  Vaping Use  . Vaping Use: Never used  Substance and Sexual Activity  . Alcohol use: No    Alcohol/week: 0.0 standard drinks  . Drug use: No  . Sexual activity: Never  Other Topics Concern  . Not on file  Social History Narrative   Long term resident of SNF    Social Determinants of Health   Financial Resource Strain: Low Risk   . Difficulty of Paying Living Expenses: Not very hard  Food Insecurity: No Food Insecurity  . Worried About Charity fundraiser in the Last Year: Never true  . Ran Out of Food in the Last Year: Never true  Transportation Needs: No Transportation Needs  . Lack of Transportation (Medical): No  . Lack of Transportation (Non-Medical): No  Physical Activity: Inactive  . Days of Exercise per Week: 0 days  . Minutes of Exercise per Session: 0 min  Stress: No Stress Concern Present  . Feeling of Stress : Not at all  Social Connections: Moderately Isolated  . Frequency of Communication with Friends and Family: More than three times a week  . Frequency of Social Gatherings with Friends and Family: Once a week  . Attends Religious Services: More than 4 times per year  . Active Member of Clubs or Organizations: No  . Attends Archivist Meetings: Never  . Marital Status: Never married  Intimate Partner Violence: Not At Risk  . Fear of Current or Ex-Partner: No  . Emotionally Abused: No  . Physically Abused: No  . Sexually Abused: No   Family History  Problem Relation Age of Onset  . Multiple myeloma Sister   . Brain cancer Sister   . Dementia Mother        died at 37  . Stroke Mother   . Heart failure Mother   . Diabetes Mother   . Heart disease Father   . Prostate cancer Brother   . Colon cancer Neg Hx        VITAL SIGNS BP (!) 118/56   Pulse 74   Temp (!) 97 F (36.1 C)   Ht _0  (1.6 m)   Wt 146 lb 9.6 oz (66.5 kg)   BMI 25.97 kg/m   Outpatient Encounter Medications as of 06/14/2020  Medication Sig  . acetaminophen (TYLENOL) 650 MG CR tablet Take 650 mg by mouth every 6 (six) hours.  Marland Kitchen acyclovir (ZOVIRAX) 200 MG capsule Take 200 mg by mouth daily.  Marland Kitchen amLODipine (NORVASC) 10 MG tablet Take 10 mg by mouth daily. Not taking on dialysis days  . anastrozole (ARIMIDEX) 1 MG tablet TAKE 1 TABLET BY MOUTH DAILY  . aspirin EC 81 MG tablet Take 81 mg by mouth daily.  . calcium acetate (PHOSLO) 667 MG  capsule Take 667-1,334 mg by mouth See admin instructions. Take 1,334 mg twice daily with meals and 667 mg once daily with snacks  . calcium-vitamin D (OSCAL WITH D) 500-200 MG-UNIT tablet Take 1 tablet by mouth daily. At 8 am  . cloNIDine (CATAPRES) 0.3 MG tablet Take 0.3 mg by mouth 2 (two) times daily.   . insulin aspart (NOVOLOG FLEXPEN) 100 UNIT/ML FlexPen Inject 10 Units into the skin 2 (two) times daily. 8 am and 5 pm, Hold for accu-check less than 150.  Marland Kitchen insulin aspart (NOVOLOG FLEXPEN) 100 UNIT/ML FlexPen Inject 15 Units into the skin daily at 12 noon. 12 pm,  Hold for accu-check less than 150  . insulin glargine (LANTUS SOLOSTAR) 100 UNIT/ML Solostar Pen Inject 10 Units into the skin at bedtime.  . melatonin 3 MG TABS tablet Take 6 mg by mouth at bedtime. For Sleep  . Multiple Vitamin (MULTIVITAMIN) capsule Take 1 capsule by mouth daily.  . NON FORMULARY Diet: _____ Regular, ___x___ NAS, _______Consistent Carbohydrate, _______NPO __x___Other Low Potassium  . Nutritional Supplements (NEPRO PO) Take 1 Can by mouth at bedtime.   No facility-administered encounter medications on file as of 06/14/2020.     SIGNIFICANT DIAGNOSTIC EXAMS   LABS REVIEWED PREVIOUS  10-13-19: hgb a1c 6.0; vit B 12: 1126 folate 20.9 10-17-19: wbc 8.8; hgb 8.3; hct 26.1; mcv 114.0 plt 270; glucose 190; bun  28; creat 4.85; k+ 3.9; na++ 127; ca 8.9 liver normal albumin 4.0  10-31-19: wbc 6.0; hgb 8.8; hct 28.6; mcv 117.2 plt 326; glucose 188; bun 45; creat 6.28; k+ 3.9; na++ 137;ca 9.1 liver normal albumin 3.9 11-07-19: wbc 6.1; hgb 9.2; hct 29.4 mcv 115.7 plt 276; glucose 201; bun 44; creat 5.93; k+ 4.5; na++ 136; ca 9.1 liver normal albumin 3.8 11-14-19; wbc 8.3; hgb 9.7; hct 31.2 mcv 116.4 plt 261; glucose 198; bun 44; creat 6.66; k+ 5.1; na++ 138; ca 8.8 liver normal albumin 3.9   12-12-19: wbc 7.6; hgb 11.3; hct 35.6; mcv 109.9 plt 212; glucose 602; bun 67; creat 8.62; k+ 5.6; na++ 135; ca 7.9 liver normal albumin 3.5  12-20-19: hgb a1c 8.0  01-09-20: wbc 6.9; hgb 12.1; hct 39.7; mcv 105.6 plt 299; glucose 225; bun 112; creat 14.53; k+ 6.4; na++ 132; ca 8.3 liver normal albumin 3.5  01-23-20: k+ 6.6  01-23-20: wbc 7.9; hgb 11.6; hct 36.9; mcv 103.7 plt 359; glucose 139; bun 109; creat 15.46; k+ 6.1; na++ 140; ca 8.7 liver normal albumin 4.1;  01-24-20: k+ 4.4  01-30-20: wbc 10.6; hgb 9.7; hct 32.0; mcv 103.6 plt 390; glucose 164; bun 44; creat 9.08; k+ 4.1; na++ 139; ca 9.0 liver normal albumin 3.3 02-06-20: wbc 7.8; hgb 9.9; hct 32.7 mcv 104.8 plt 344; glucose 211; bun 90; creat 10.97; k+ 6.0 na++ 137; ca 8.8 liver normal albumin 3.4  03-26-20: wbc 6.3; hgb 9.4; hct 30.4; mcv 104.1; plt 300; glucose 223; bun 101; creat 14.22; k+ 5.8; na++ 135; ca 8.1 GFR 2; liver normal albumin 3.4  05-21-20; wbc 5.1; hgb 10.4; hct 32.8; mcv 106.8 plt 303; glucose 186; bun 41; creat 1.54; k+ 5.0; na++ 132; ca 8.4 GFR 5; liver normal albumin 3.7  TODAY  06-13-20: chol 189; ldl 106; trig 211; hdl 41; hgb a1c 6.2     Review of Systems  Constitutional: Negative for malaise/fatigue.  Respiratory: Negative for cough and shortness of breath.   Cardiovascular: Negative for chest pain, palpitations and leg swelling.  Gastrointestinal: Negative for abdominal pain,  constipation and heartburn.  Musculoskeletal: Negative for  back pain, joint pain and myalgias.  Skin: Negative.   Neurological: Negative for dizziness.  Psychiatric/Behavioral: The patient is not nervous/anxious.     Physical Exam Constitutional:      General: She is not in acute distress.    Appearance: She is well-developed. She is not diaphoretic.  Neck:     Thyroid: No thyromegaly.  Cardiovascular:     Rate and Rhythm: Normal rate and regular rhythm.     Pulses: Normal pulses.     Heart sounds: Normal heart sounds.  Pulmonary:     Effort: Pulmonary effort is normal. No respiratory distress.     Breath sounds: Normal breath sounds.  Abdominal:     General: Bowel sounds are normal. There is no distension.     Palpations: Abdomen is soft.     Tenderness: There is no abdominal tenderness.  Musculoskeletal:        General: Normal range of motion.     Cervical back: Neck supple.     Right lower leg: No edema.     Left lower leg: No edema.  Lymphadenopathy:     Cervical: No cervical adenopathy.  Skin:    General: Skin is warm and dry.     Comments: Left neck dialysis access Right arm ace wrap off    Neurological:     Mental Status: She is alert. Mental status is at baseline.  Psychiatric:        Mood and Affect: Mood normal.       ASSESSMENT/ PLAN:  TODAY  1. Hyperlipidemia associated with type 2 diabetes mellitus 2. Diabetes mellitus with ESRD (end stage renal disease)  Her hgb a1c is 6.2; will monitor  Will begin her on lipitor 10 mg daily and will monitor her status.    Ok Edwards NP [facility]  Contact [phone] Monday through Friday 8am- 5pm  After hours call [phone]

## 2020-06-17 DIAGNOSIS — Z992 Dependence on renal dialysis: Secondary | ICD-10-CM | POA: Diagnosis not present

## 2020-06-17 DIAGNOSIS — N186 End stage renal disease: Secondary | ICD-10-CM | POA: Diagnosis not present

## 2020-06-18 ENCOUNTER — Inpatient Hospital Stay (HOSPITAL_COMMUNITY): Payer: Medicare PPO

## 2020-06-18 ENCOUNTER — Inpatient Hospital Stay (HOSPITAL_COMMUNITY): Payer: Medicare PPO | Attending: Hematology

## 2020-06-18 ENCOUNTER — Encounter (HOSPITAL_COMMUNITY): Payer: Self-pay

## 2020-06-18 ENCOUNTER — Other Ambulatory Visit: Payer: Self-pay

## 2020-06-18 VITALS — BP 93/40 | HR 70 | Temp 97.0°F | Resp 18 | Wt 139.7 lb

## 2020-06-18 DIAGNOSIS — Z5111 Encounter for antineoplastic chemotherapy: Secondary | ICD-10-CM | POA: Insufficient documentation

## 2020-06-18 DIAGNOSIS — C9000 Multiple myeloma not having achieved remission: Secondary | ICD-10-CM

## 2020-06-18 LAB — CBC WITH DIFFERENTIAL/PLATELET
Abs Immature Granulocytes: 0.01 10*3/uL (ref 0.00–0.07)
Basophils Absolute: 0.1 10*3/uL (ref 0.0–0.1)
Basophils Relative: 1 %
Eosinophils Absolute: 0.2 10*3/uL (ref 0.0–0.5)
Eosinophils Relative: 4 %
HCT: 32 % — ABNORMAL LOW (ref 36.0–46.0)
Hemoglobin: 10.1 g/dL — ABNORMAL LOW (ref 12.0–15.0)
Immature Granulocytes: 0 %
Lymphocytes Relative: 23 %
Lymphs Abs: 1.1 10*3/uL (ref 0.7–4.0)
MCH: 33.7 pg (ref 26.0–34.0)
MCHC: 31.6 g/dL (ref 30.0–36.0)
MCV: 106.7 fL — ABNORMAL HIGH (ref 80.0–100.0)
Monocytes Absolute: 0.6 10*3/uL (ref 0.1–1.0)
Monocytes Relative: 12 %
Neutro Abs: 2.9 10*3/uL (ref 1.7–7.7)
Neutrophils Relative %: 60 %
Platelets: 265 10*3/uL (ref 150–400)
RBC: 3 MIL/uL — ABNORMAL LOW (ref 3.87–5.11)
RDW: 16 % — ABNORMAL HIGH (ref 11.5–15.5)
WBC: 4.8 10*3/uL (ref 4.0–10.5)
nRBC: 0 % (ref 0.0–0.2)

## 2020-06-18 LAB — COMPREHENSIVE METABOLIC PANEL
ALT: 11 U/L (ref 0–44)
AST: 23 U/L (ref 15–41)
Albumin: 3.6 g/dL (ref 3.5–5.0)
Alkaline Phosphatase: 73 U/L (ref 38–126)
Anion gap: 14 (ref 5–15)
BUN: 36 mg/dL — ABNORMAL HIGH (ref 8–23)
CO2: 26 mmol/L (ref 22–32)
Calcium: 7.9 mg/dL — ABNORMAL LOW (ref 8.9–10.3)
Chloride: 91 mmol/L — ABNORMAL LOW (ref 98–111)
Creatinine, Ser: 7 mg/dL — ABNORMAL HIGH (ref 0.44–1.00)
GFR, Estimated: 5 mL/min — ABNORMAL LOW (ref >60)
Glucose, Bld: 340 mg/dL — ABNORMAL HIGH (ref 70–99)
Potassium: 4.8 mmol/L (ref 3.5–5.1)
Sodium: 131 mmol/L — ABNORMAL LOW (ref 135–145)
Total Bilirubin: 0.5 mg/dL (ref 0.3–1.2)
Total Protein: 6.4 g/dL — ABNORMAL LOW (ref 6.5–8.1)

## 2020-06-18 LAB — LACTATE DEHYDROGENASE: LDH: 210 U/L — ABNORMAL HIGH (ref 98–192)

## 2020-06-18 MED ORDER — PROCHLORPERAZINE MALEATE 10 MG PO TABS
10.0000 mg | ORAL_TABLET | Freq: Once | ORAL | Status: AC
  Administered 2020-06-18: 10 mg via ORAL

## 2020-06-18 MED ORDER — DEXAMETHASONE 4 MG PO TABS
ORAL_TABLET | ORAL | Status: AC
  Filled 2020-06-18: qty 3

## 2020-06-18 MED ORDER — DEXAMETHASONE 4 MG PO TABS
10.0000 mg | ORAL_TABLET | Freq: Once | ORAL | Status: AC
Start: 2020-06-18 — End: 2020-06-18
  Administered 2020-06-18: 10 mg via ORAL

## 2020-06-18 MED ORDER — BORTEZOMIB CHEMO SQ INJECTION 3.5 MG (2.5MG/ML)
1.3000 mg/m2 | Freq: Once | INTRAMUSCULAR | Status: AC
  Administered 2020-06-18: 2.25 mg via SUBCUTANEOUS
  Filled 2020-06-18: qty 0.9

## 2020-06-18 MED ORDER — PROCHLORPERAZINE MALEATE 10 MG PO TABS
ORAL_TABLET | ORAL | Status: AC
  Filled 2020-06-18: qty 1

## 2020-06-18 NOTE — Patient Instructions (Signed)
[facility] Discharge Instructions for Patients Receiving Chemotherapy  Today you received the following chemotherapy agent Velcade   To help prevent nausea and vomiting after your treatment, we encourage you to take your nausea medication   If you develop nausea and vomiting that is not controlled by your nausea medication, call the clinic.   BELOW ARE SYMPTOMS THAT SHOULD BE REPORTED IMMEDIATELY:  *FEVER GREATER THAN 100.5 F  *CHILLS WITH OR WITHOUT FEVER  NAUSEA AND VOMITING THAT IS NOT CONTROLLED WITH YOUR NAUSEA MEDICATION  *UNUSUAL SHORTNESS OF BREATH  *UNUSUAL BRUISING OR BLEEDING  TENDERNESS IN MOUTH AND THROAT WITH OR WITHOUT PRESENCE OF ULCERS  *URINARY PROBLEMS  *BOWEL PROBLEMS  UNUSUAL RASH Items with * indicate a potential emergency and should be followed up as soon as possible.  Feel free to call the clinic should you have any questions or concerns. The clinic phone number is [phone].  Please show the [facility] at check-in to the Emergency Department and triage nurse.

## 2020-06-18 NOTE — Progress Notes (Signed)
Labs within parameters for treatment per treatment guidelines. Blood pressure low on arrival. Patient denies any changes since her last visit. Patient denies pain today.   Treatment given today per MD orders. Tolerated infusion without adverse affects. Vital signs stable. No complaints at this time. Discharged from clinic via wheel chair in stable condition. Alert and oriented x 3. F/U with [facility] as scheduled.

## 2020-06-19 DIAGNOSIS — N186 End stage renal disease: Secondary | ICD-10-CM | POA: Diagnosis not present

## 2020-06-19 DIAGNOSIS — E1169 Type 2 diabetes mellitus with other specified complication: Secondary | ICD-10-CM | POA: Insufficient documentation

## 2020-06-19 DIAGNOSIS — E785 Hyperlipidemia, unspecified: Secondary | ICD-10-CM | POA: Insufficient documentation

## 2020-06-19 DIAGNOSIS — Z992 Dependence on renal dialysis: Secondary | ICD-10-CM | POA: Diagnosis not present

## 2020-06-19 DIAGNOSIS — Z23 Encounter for immunization: Secondary | ICD-10-CM | POA: Diagnosis not present

## 2020-06-19 LAB — KAPPA/LAMBDA LIGHT CHAINS
Kappa free light chain: 137.5 mg/L — ABNORMAL HIGH (ref 3.3–19.4)
Kappa, lambda light chain ratio: 0.79 (ref 0.26–1.65)
Lambda free light chains: 174.1 mg/L — ABNORMAL HIGH (ref 5.7–26.3)

## 2020-06-20 ENCOUNTER — Ambulatory Visit (INDEPENDENT_AMBULATORY_CARE_PROVIDER_SITE_OTHER): Payer: Medicare PPO | Admitting: Physician Assistant

## 2020-06-20 ENCOUNTER — Ambulatory Visit (HOSPITAL_COMMUNITY)
Admission: RE | Admit: 2020-06-20 | Discharge: 2020-06-20 | Disposition: A | Discharge status: Home / Self Care | Payer: Medicare PPO | Source: Ambulatory Visit | Attending: Vascular Surgery | Admitting: Vascular Surgery

## 2020-06-20 ENCOUNTER — Other Ambulatory Visit: Payer: Self-pay

## 2020-06-20 VITALS — BP 98/47 | HR 77 | Temp 98.2°F

## 2020-06-20 DIAGNOSIS — N186 End stage renal disease: Secondary | ICD-10-CM | POA: Insufficient documentation

## 2020-06-20 DIAGNOSIS — Z992 Dependence on renal dialysis: Secondary | ICD-10-CM | POA: Insufficient documentation

## 2020-06-20 DIAGNOSIS — R600 Localized edema: Secondary | ICD-10-CM

## 2020-06-20 LAB — PROTEIN ELECTROPHORESIS, SERUM
A/G Ratio: 1.4 (ref 0.7–1.7)
Albumin ELP: 3.5 g/dL (ref 2.9–4.4)
Alpha-1-Globulin: 0.2 g/dL (ref 0.0–0.4)
Alpha-2-Globulin: 0.9 g/dL (ref 0.4–1.0)
Beta Globulin: 0.7 g/dL (ref 0.7–1.3)
Gamma Globulin: 0.6 g/dL (ref 0.4–1.8)
Globulin, Total: 2.5 g/dL (ref 2.2–3.9)
Total Protein ELP: 6 g/dL (ref 6.0–8.5)

## 2020-06-20 NOTE — H&P (View-Only) (Signed)
  POST OPERATIVE DIALYSIS ACCESS OFFICE NOTE    CC:  F/u for dialysis access surgery  HPI:  This is a 85 y.o. female who is s/p  PROCEDURE:   1) TDC removal 2) LIJ TDC placement (23cm catheter) 3) US guided access of RUE AVF 4) Fistulagram 5) Central venogram (28mL total contrast) 6) Angioplasty of right innominate vein / superior vena cava occlusion (14x52mm Atlas)  She had presented with severe right upper extremity swelling after brachiocephalic AVF creation initially in February. Here edema worsened and the above was performed.  She presents for follow-up of this today. Her right brachiocepahlic AV fistula was created on April 04, 2020.  She denies hand pain.  Her right forearm is wrapped in an Ace bandage.  Dialysis days:  TTS   Dialysis center:  ?  Resident of [facility] nursing center.  Allergies  Allergen Reactions  . Ace Inhibitors Other (See Comments) and Cough    Tongue swell  . Penicillins Other (See Comments)    Unsure of reaction Has patient had a PCN reaction causing immediate rash, facial/tongue/throat swelling, SOB or lightheadedness with hypotension: Unknown Has patient had a PCN reaction causing severe rash involving mucus membranes or skin necrosis: Unknown Has patient had a PCN reaction that required hospitalization: No Has patient had a PCN reaction occurring within the last 10 years: Unknown If all of the above answers are "NO", then may proceed with Cephalosporin use.      No current outpatient medications on file.   No current facility-administered medications for this visit.     ROS:  See HPI  BP (!) 98/47 (BP Location: Right Arm)   Pulse 77   Temp 98.2 F (36.8 C) (Skin)   SpO2 98%    Physical Exam:  General appearance: Well-developed, well-nourished in no apparent distress Cardiac: Heart rate and rhythm are regular Respiratory: non-labored Incision:  Well healed Extremities:  RUE: Mild residual edema.  5/5 right hand grip strength.   Good bruit and thrill in fistula.  The fistula is easily palpable at the [facility], however it is more difficult to follow along the upper arm. ACE wrap replaced.  Dialysis duplex on 06/20/2020 Diameter of the cephalic vein averages 9.89 cm Depth is 0.53 at the distal upper arm, 0.57-0.26 at the [facility] fossa.  1.02 cm's at the mid upper arm and 0.86 at the proximal upper arm.  Assessment/Plan:   -pt does not have evidence of steal syndrome -dialysis duplex today reveals fistula to be of adequate diameter but likely too deep for access -Discussed with Dr. Oneida Alar.  He recommends repeating central venogram due to residual edema.  We will make arrangements for this on non-dialysis day.  Barbie Banner, PA-C 06/20/2020 2:24 PM [facility] [phone]  Clinic MD:  Oneida Alar

## 2020-06-20 NOTE — Progress Notes (Signed)
  POST OPERATIVE DIALYSIS ACCESS OFFICE NOTE    CC:  F/u for dialysis access surgery  HPI:  This is a 85 y.o. female who is s/p  PROCEDURE:   1) TDC removal 2) LIJ TDC placement (23cm catheter) 3) US guided access of RUE AVF 4) Fistulagram 5) Central venogram (25mL total contrast) 6) Angioplasty of right innominate vein / superior vena cava occlusion (14x27mm Atlas)  She had presented with severe right upper extremity swelling after brachiocephalic AVF creation initially in February. Here edema worsened and the above was performed.  She presents for follow-up of this today. Her right brachiocepahlic AV fistula was created on April 04, 2020.  She denies hand pain.  Her right forearm is wrapped in an Ace bandage.  Dialysis days:  TTS   Dialysis center:  ?  Resident of [name] nursing center.  Allergies  Allergen Reactions  . Ace Inhibitors Other (See Comments) and Cough    Tongue swell  . Penicillins Other (See Comments)    Unsure of reaction Has patient had a PCN reaction causing immediate rash, facial/tongue/throat swelling, SOB or lightheadedness with hypotension: Unknown Has patient had a PCN reaction causing severe rash involving mucus membranes or skin necrosis: Unknown Has patient had a PCN reaction that required hospitalization: No Has patient had a PCN reaction occurring within the last 10 years: Unknown If all of the above answers are "NO", then may proceed with Cephalosporin use.      No current outpatient medications on file.   No current facility-administered medications for this visit.     ROS:  See HPI  BP (!) 98/47 (BP Location: Right Arm)   Pulse 77   Temp 98.2 F (36.8 C) (Skin)   SpO2 98%    Physical Exam:  General appearance: Well-developed, well-nourished in no apparent distress Cardiac: Heart rate and rhythm are regular Respiratory: non-labored Incision:  Well healed Extremities:  RUE: Mild residual edema.  5/5 right hand grip strength.   Good bruit and thrill in fistula.  The fistula is easily palpable at the [facility], however it is more difficult to follow along the upper arm. ACE wrap replaced.  Dialysis duplex on 06/20/2020 Diameter of the cephalic vein averages 0.25 cm Depth is 0.53 at the distal upper arm, 0.57-0.26 at the [facility] fossa.  1.02 cm's at the mid upper arm and 0.86 at the proximal upper arm.  Assessment/Plan:   -pt does not have evidence of steal syndrome -dialysis duplex today reveals fistula to be of adequate diameter but likely too deep for access -Discussed with Dr. Oneida Alar.  He recommends repeating central venogram due to residual edema.  We will make arrangements for this on non-dialysis day.  Barbie Banner, PA-C 06/20/2020 2:24 PM [facility] [phone]  Clinic MD:  Oneida Alar

## 2020-06-21 DIAGNOSIS — N186 End stage renal disease: Secondary | ICD-10-CM | POA: Diagnosis not present

## 2020-06-21 DIAGNOSIS — Z992 Dependence on renal dialysis: Secondary | ICD-10-CM | POA: Diagnosis not present

## 2020-06-24 ENCOUNTER — Encounter (HOSPITAL_COMMUNITY): Admission: RE | Payer: Self-pay | Source: Ambulatory Visit

## 2020-06-24 ENCOUNTER — Ambulatory Visit (HOSPITAL_COMMUNITY): Admission: RE | Admit: 2020-06-24 | Payer: Medicare PPO | Source: Ambulatory Visit | Admitting: Vascular Surgery

## 2020-06-24 SURGERY — UPPER EXTREMITY VENOGRAPHY
Anesthesia: LOCAL | Laterality: Right

## 2020-06-25 ENCOUNTER — Ambulatory Visit (HOSPITAL_COMMUNITY): Payer: Medicare PPO

## 2020-06-25 ENCOUNTER — Inpatient Hospital Stay (HOSPITAL_COMMUNITY): Payer: Medicare PPO

## 2020-06-25 DIAGNOSIS — Z992 Dependence on renal dialysis: Secondary | ICD-10-CM | POA: Diagnosis not present

## 2020-06-25 DIAGNOSIS — N186 End stage renal disease: Secondary | ICD-10-CM | POA: Diagnosis not present

## 2020-06-26 ENCOUNTER — Non-Acute Institutional Stay (SKILLED_NURSING_FACILITY): Payer: Medicare PPO | Admitting: Adult Health

## 2020-06-26 ENCOUNTER — Encounter: Payer: Self-pay | Admitting: Adult Health

## 2020-06-26 DIAGNOSIS — N186 End stage renal disease: Secondary | ICD-10-CM

## 2020-06-26 DIAGNOSIS — I700 Atherosclerosis of aorta: Secondary | ICD-10-CM | POA: Diagnosis not present

## 2020-06-26 DIAGNOSIS — I70209 Unspecified atherosclerosis of native arteries of extremities, unspecified extremity: Secondary | ICD-10-CM | POA: Diagnosis not present

## 2020-06-26 DIAGNOSIS — E875 Hyperkalemia: Secondary | ICD-10-CM

## 2020-06-26 DIAGNOSIS — E1122 Type 2 diabetes mellitus with diabetic chronic kidney disease: Secondary | ICD-10-CM | POA: Diagnosis not present

## 2020-06-26 DIAGNOSIS — Z992 Dependence on renal dialysis: Secondary | ICD-10-CM | POA: Diagnosis not present

## 2020-06-26 NOTE — Progress Notes (Signed)
Location:  [facility] Room Number: 222 Place of Service:  SNF (31)   CODE STATUS: full code   Allergies  Allergen Reactions  . Ace Inhibitors Other (See Comments) and Cough    Tongue swell  . Penicillins Other (See Comments)    Unsure of reaction Has patient had a PCN reaction causing immediate rash, facial/tongue/throat swelling, SOB or lightheadedness with hypotension: Unknown Has patient had a PCN reaction causing severe rash involving mucus membranes or skin necrosis: Unknown Has patient had a PCN reaction that required hospitalization: No Has patient had a PCN reaction occurring within the last 10 years: Unknown If all of the above answers are "NO", then may proceed with Cephalosporin use.      Chief Complaint  Patient presents with  . Medical Management of Chronic Issues           Diabetes with end stage renal disease (ESRD)      Aortic atherosclerosis/athersclerotic peripheral vascular disease:  Hyperkalemia:    HPI:  She is a 85 year old long term resident of this facility being seen for the management of her chronic illnesses:  Diabetes with end stage renal disease (ESRD)      Aortic atherosclerosis/athersclerotic peripheral vascular disease:  Hyperkalemia:. There are no reports of pain present; no reports of changes in appetite; no reports of anxiety or insomnia.   Past Medical History:  Diagnosis Date  . Anemia   . Anxiety   . Chronic kidney disease   . Chronic renal disease, stage 4, severely decreased glomerular filtration rate (GFR) between 15-29 mL/min/1.73 square meter (HCC) 08/22/2015  . Complication of anesthesia    delirious after Breast Surgery  . Dementia (HCC)    mild  . Depression   . Diabetes mellitus without complication (HCC)    type II  . Dyspnea    with activity  . GERD (gastroesophageal reflux disease)   . Glaucoma   . Hypertension   . Pneumonia   . Stage 1 infiltrating ductal carcinoma of right female breast  (HCC) 08/21/2015   ER+ PR+ HER 2 neu + (3+) T1cN0     Past Surgical History:  Procedure Laterality Date  . AV FISTULA PLACEMENT Left 11/22/2017   Procedure: ARTERIOVENOUS (AV) FISTULA CREATION LEFT ARM;  Surgeon: Sherren Kerns, MD;  Location: [facility] OR;  Service: Vascular;  Laterality: Left;  . AV FISTULA PLACEMENT Right 04/04/2020   Procedure: RIGHT ARM ARTERIOVENOUS FISTULA CREATION;  Surgeon: Larina Earthly, MD;  Location: [facility] ORS;  Service: Vascular;  Laterality: Right;  . BIOPSY  08/07/2016   Procedure: BIOPSY;  Surgeon: Corbin Ade, MD;  Location: [facility] SUITE;  Service: Endoscopy;;  gastric ulcer biopsy  . COLONOSCOPY    . ESOPHAGOGASTRODUODENOSCOPY N/A 08/07/2016   Procedure: ESOPHAGOGASTRODUODENOSCOPY (EGD);  Surgeon: Corbin Ade, MD;  Location: [facility];  Service: Endoscopy;  Laterality: N/A;  1215-rescheduled to 6/1 @ 2:30pm per Darlina Rumpf  . ESOPHAGOGASTRODUODENOSCOPY N/A 11/27/2016   Procedure: ESOPHAGOGASTRODUODENOSCOPY (EGD);  Surgeon: Corbin Ade, MD;  [facility]: [facility];  Service: Endoscopy;  Laterality: N/A;  8:15am  . FISTULA SUPERFICIALIZATION Left 02/14/2018   Procedure: FISTULA SUPERFICIALIZATION LEFT ARM;  Surgeon: Chuck Hint, MD;  Location: [facility] OR;  Service: Vascular;  Laterality: Left;  . FRACTURE SURGERY Right    ankle  . MALONEY DILATION N/A 08/07/2016   Procedure: Elease Hashimoto DILATION;  Surgeon: Corbin Ade, MD;  Location: [facility];  Service: Endoscopy;  Laterality: N/A;  . MASTECTOMY, PARTIAL Right   . PERIPHERAL VASCULAR BALLOON ANGIOPLASTY Left 07/13/2019   Procedure: PERIPHERAL VASCULAR BALLOON ANGIOPLASTY;  Surgeon: Marty Heck, MD;  Location: [place] [facility];  Service: Cardiovascular;  Laterality: Left;  arm fistulogram  . PERIPHERAL VASCULAR BALLOON ANGIOPLASTY Right 05/22/2020   Procedure: PERIPHERAL VASCULAR BALLOON ANGIOPLASTY;  Surgeon: Cherre Robins, MD;  Location: [place] [facility];  Service: Cardiovascular;   Laterality: Right;  arm fistula  . PORT-A-CATH REMOVAL Left 11/22/2017   Procedure: REMOVAL PORT-A-CATH LEFT CHEST;  Surgeon: Elam Dutch, MD;  Location: [facility] OR;  Service: Vascular;  Laterality: Left;  . RETINAL DETACHMENT SURGERY Right     Social History   Socioeconomic History  . Marital status: Single    Spouse name: Not on file  . Number of children: Not on file  . Years of education: Not on file  . Highest education level: Not on file  Occupational History  . Occupation: retired   Tobacco Use  . Smoking status: Never Smoker  . Smokeless tobacco: Never Used  Vaping Use  . Vaping Use: Never used  Substance and Sexual Activity  . Alcohol use: No    Alcohol/week: 0.0 standard drinks  . Drug use: No  . Sexual activity: Never  Other Topics Concern  . Not on file  Social History Narrative   Long term resident of SNF    Social Determinants of Health   Financial Resource Strain: Low Risk   . Difficulty of Paying Living Expenses: Not very hard  Food Insecurity: No Food Insecurity  . Worried About Charity fundraiser in the Last Year: Never true  . Ran Out of Food in the Last Year: Never true  Transportation Needs: No Transportation Needs  . Lack of Transportation (Medical): No  . Lack of Transportation (Non-Medical): No  Physical Activity: Inactive  . Days of Exercise per Week: 0 days  . Minutes of Exercise per Session: 0 min  Stress: No Stress Concern Present  . Feeling of Stress : Not at all  Social Connections: Moderately Isolated  . Frequency of Communication with Friends and Family: More than three times a week  . Frequency of Social Gatherings with Friends and Family: Once a week  . Attends Religious Services: More than 4 times per year  . Active Member of Clubs or Organizations: No  . Attends Archivist Meetings: Never  . Marital Status: Never married  Intimate Partner Violence: Not At Risk  . Fear of Current or Ex-Partner: No  . Emotionally  Abused: No  . Physically Abused: No  . Sexually Abused: No   Family History  Problem Relation Age of Onset  . Multiple myeloma Sister   . Brain cancer Sister   . Dementia Mother        died at 73  . Stroke Mother   . Heart failure Mother   . Diabetes Mother   . Heart disease Father   . Prostate cancer Brother   . Colon cancer Neg Hx       VITAL SIGNS BP 112/68   Pulse 68   Temp 97.9 F (36.6 C)   Resp 18   Ht _0  (1.6 m)   Wt 139 lb (63 kg)   BMI 24.62 kg/m   Outpatient Encounter Medications as of 06/26/2020  Medication Sig  . acetaminophen (TYLENOL) 650 MG CR tablet Take 650 mg by mouth every 6 (six) hours.  Marland Kitchen acyclovir (ZOVIRAX)  200 MG capsule Take 200 mg by mouth daily.  Marland Kitchen amLODipine (NORVASC) 10 MG tablet Take 10 mg by mouth daily. Not taking on dialysis days  . anastrozole (ARIMIDEX) 1 MG tablet TAKE 1 TABLET BY MOUTH DAILY (Patient taking differently: Take 1 mg by mouth daily.)  . aspirin EC 81 MG tablet Take 81 mg by mouth daily.  Marland Kitchen atorvastatin (LIPITOR) 10 MG tablet Take 10 mg by mouth at bedtime.  . calcium acetate (PHOSLO) 667 MG capsule Take 667-1,334 mg by mouth See admin instructions. Take 1,334 mg twice daily with meals and 667 mg once daily with snacks  . calcium-vitamin D (OSCAL WITH D) 500-200 MG-UNIT tablet Take 1 tablet by mouth daily. At 8 am  . cloNIDine (CATAPRES) 0.3 MG tablet Take 0.3 mg by mouth 2 (two) times daily.   . insulin aspart (NOVOLOG FLEXPEN) 100 UNIT/ML FlexPen Inject 10 Units into the skin 2 (two) times daily. 8 am and 5 pm, Hold for accu-check less than 150.  Marland Kitchen insulin aspart (NOVOLOG FLEXPEN) 100 UNIT/ML FlexPen Inject 10 Units into the skin 2 (two) times daily. Hold for accu-check less than 150  . insulin glargine (LANTUS SOLOSTAR) 100 UNIT/ML Solostar Pen Inject 10 Units into the skin at bedtime.  . melatonin 3 MG TABS tablet Take 6 mg by mouth at bedtime. For Sleep  . Multiple Vitamin (MULTIVITAMIN) capsule Take 1 capsule by  mouth daily.  . NON FORMULARY Diet: _____ Regular, ___x___ NAS, _______Consistent Carbohydrate, _______NPO __x___Other Low Potassium  . Nutritional Supplements (NEPRO PO) Take 1 Can by mouth at bedtime.   No facility-administered encounter medications on file as of 06/26/2020.     SIGNIFICANT DIAGNOSTIC EXAMS   LABS REVIEWED PREVIOUS  10-13-19: hgb a1c 6.0; vit B 12: 1126 folate 20.9 10-17-19: wbc 8.8; hgb 8.3; hct 26.1; mcv 114.0 plt 270; glucose 190; bun 28; creat 4.85; k+ 3.9; na++ 127; ca 8.9 liver normal albumin 4.0  10-31-19: wbc 6.0; hgb 8.8; hct 28.6; mcv 117.2 plt 326; glucose 188; bun 45; creat 6.28; k+ 3.9; na++ 137;ca 9.1 liver normal albumin 3.9 11-07-19: wbc 6.1; hgb 9.2; hct 29.4 mcv 115.7 plt 276; glucose 201; bun 44; creat 5.93; k+ 4.5; na++ 136; ca 9.1 liver normal albumin 3.8 11-14-19; wbc 8.3; hgb 9.7; hct 31.2 mcv 116.4 plt 261; glucose 198; bun 44; creat 6.66; k+ 5.1; na++ 138; ca 8.8 liver normal albumin 3.9   12-12-19: wbc 7.6; hgb 11.3; hct 35.6; mcv 109.9 plt 212; glucose 602; bun 67; creat 8.62; k+ 5.6; na++ 135; ca 7.9 liver normal albumin 3.5  12-20-19: hgb a1c 8.0  01-09-20: wbc 6.9; hgb 12.1; hct 39.7; mcv 105.6 plt 299; glucose 225; bun 112; creat 14.53; k+ 6.4; na++ 132; ca 8.3 liver normal albumin 3.5  01-23-20: k+ 6.6  01-23-20: wbc 7.9; hgb 11.6; hct 36.9; mcv 103.7 plt 359; glucose 139; bun 109; creat 15.46; k+ 6.1; na++ 140; ca 8.7 liver normal albumin 4.1;  01-24-20: k+ 4.4  01-30-20: wbc 10.6; hgb 9.7; hct 32.0; mcv 103.6 plt 390; glucose 164; bun 44; creat 9.08; k+ 4.1; na++ 139; ca 9.0 liver normal albumin 3.3 02-06-20: wbc 7.8; hgb 9.9; hct 32.7 mcv 104.8 plt 344; glucose 211; bun 90; creat 10.97; k+ 6.0 na++ 137; ca 8.8 liver normal albumin 3.4  03-26-20: wbc 6.3; hgb 9.4; hct 30.4; mcv 104.1; plt 300; glucose 223; bun 101; creat 14.22; k+ 5.8; na++ 135; ca 8.1 GFR 2; liver normal albumin 3.4  05-21-20; wbc  5.1; hgb 10.4; hct 32.8; mcv 106.8 plt 303;  glucose 186; bun 41; creat 1.54; k+ 5.0; na++ 132; ca 8.4 GFR 5; liver normal albumin 3.7 06-13-20: chol 189; ldl 106; trig 211; hdl 41; hgb a1c 6.2    NO NEW LABS.    Review of Systems  Constitutional: Negative for malaise/fatigue.  Respiratory: Negative for cough and shortness of breath.   Cardiovascular: Negative for chest pain, palpitations and leg swelling.  Gastrointestinal: Negative for abdominal pain, constipation and heartburn.  Musculoskeletal: Negative for back pain, joint pain and myalgias.  Skin: Negative.   Neurological: Negative for dizziness.  Psychiatric/Behavioral: The patient is not nervous/anxious.     Physical Exam Constitutional:      General: She is not in acute distress.    Appearance: She is well-developed. She is not diaphoretic.  Neck:     Thyroid: No thyromegaly.  Cardiovascular:     Rate and Rhythm: Normal rate and regular rhythm.     Pulses: Normal pulses.     Heart sounds: Normal heart sounds.  Pulmonary:     Effort: Pulmonary effort is normal. No respiratory distress.     Breath sounds: Normal breath sounds.  Abdominal:     General: Bowel sounds are normal. There is no distension.     Palpations: Abdomen is soft.     Tenderness: There is no abdominal tenderness.  Musculoskeletal:        General: Normal range of motion.     Cervical back: Neck supple.     Right lower leg: No edema.     Left lower leg: No edema.  Lymphadenopathy:     Cervical: No cervical adenopathy.  Skin:    General: Skin is warm and dry.     Comments: Left neck dialysis access Right arm ace wrap on     Neurological:     Mental Status: She is alert. Mental status is at baseline.  Psychiatric:        Mood and Affect: Mood normal.        ASSESSMENT/ PLAN:  TODAY  1. Diabetes with end stage renal disease (ESRD) is stable hgb a1c 6.2 will continue novolog 10 units with breakfast and supper with 15 units at lunch; basaglar 10 units nightly   2. Aortic  atherosclerosis/athersclerotic peripheral vascular disease: is stable (scan 10-04-18) will monitor  3. Hyperkalemia: k+ 5.0 is stable will monitor    PREVIOUS   4 Chronic kidney disease with end stage renal disease on dialysis due to type 2 diabetes mellitus/dependence on dialysis: will continue dialysis three days weekly; will continue phoshyra 667 mg 2 tabs twice daily and 1 with snacks.   5. Hypertension due to end stage renal disease de to type 2 diabetes mellitus: is stable b/p 112/60 will continue norvasc 10 mg four times weekly and clonidine 0.3 mg twice daily asa 81 mg daily   6. Stage 1 infiltrating ductal carcinoma of right female breast Is stable is followed by oncology will continue armidex 1 mg daily   7. multiple myeloma not achieving remission: is without change is followed by oncology.   8. Hyperlipidemia associated with type 2 diabetes mellitus: is stable LDL 106 will continue lipitor 10 mg daily   9. Anemia due to end stage renal disease: is stable hgb 10.4; will monitor  10. Herpes: no reports of out breaks present will continue acyclovir 200 mg daily   11. Vascular dementia with behavioral disturbance: is without change weight is 139 pounds will  monitor      Ok Edwards NP [facility]  Contact [phone] Monday through Friday 8am- 5pm  After hours call [phone]

## 2020-06-28 DIAGNOSIS — Z992 Dependence on renal dialysis: Secondary | ICD-10-CM | POA: Diagnosis not present

## 2020-06-28 DIAGNOSIS — N186 End stage renal disease: Secondary | ICD-10-CM | POA: Diagnosis not present

## 2020-07-01 DIAGNOSIS — N186 End stage renal disease: Secondary | ICD-10-CM | POA: Diagnosis not present

## 2020-07-01 DIAGNOSIS — Z992 Dependence on renal dialysis: Secondary | ICD-10-CM | POA: Diagnosis not present

## 2020-07-02 ENCOUNTER — Other Ambulatory Visit: Payer: Self-pay

## 2020-07-02 ENCOUNTER — Inpatient Hospital Stay (HOSPITAL_COMMUNITY): Payer: Medicare PPO

## 2020-07-02 ENCOUNTER — Encounter (HOSPITAL_COMMUNITY): Payer: Self-pay

## 2020-07-02 VITALS — BP 121/48 | HR 74 | Temp 96.8°F | Resp 18 | Wt 139.8 lb

## 2020-07-02 DIAGNOSIS — L603 Nail dystrophy: Secondary | ICD-10-CM | POA: Diagnosis not present

## 2020-07-02 DIAGNOSIS — C9000 Multiple myeloma not having achieved remission: Secondary | ICD-10-CM

## 2020-07-02 DIAGNOSIS — E1151 Type 2 diabetes mellitus with diabetic peripheral angiopathy without gangrene: Secondary | ICD-10-CM | POA: Diagnosis not present

## 2020-07-02 DIAGNOSIS — Z5111 Encounter for antineoplastic chemotherapy: Secondary | ICD-10-CM | POA: Diagnosis not present

## 2020-07-02 DIAGNOSIS — Z794 Long term (current) use of insulin: Secondary | ICD-10-CM | POA: Diagnosis not present

## 2020-07-02 DIAGNOSIS — M2141 Flat foot [pes planus] (acquired), right foot: Secondary | ICD-10-CM | POA: Diagnosis not present

## 2020-07-02 DIAGNOSIS — E1140 Type 2 diabetes mellitus with diabetic neuropathy, unspecified: Secondary | ICD-10-CM | POA: Diagnosis not present

## 2020-07-02 DIAGNOSIS — B351 Tinea unguium: Secondary | ICD-10-CM | POA: Diagnosis not present

## 2020-07-02 LAB — COMPREHENSIVE METABOLIC PANEL
ALT: 11 U/L (ref 0–44)
AST: 18 U/L (ref 15–41)
Albumin: 3.7 g/dL (ref 3.5–5.0)
Alkaline Phosphatase: 81 U/L (ref 38–126)
Anion gap: 16 — ABNORMAL HIGH (ref 5–15)
BUN: 37 mg/dL — ABNORMAL HIGH (ref 8–23)
CO2: 26 mmol/L (ref 22–32)
Calcium: 8.2 mg/dL — ABNORMAL LOW (ref 8.9–10.3)
Chloride: 93 mmol/L — ABNORMAL LOW (ref 98–111)
Creatinine, Ser: 6.82 mg/dL — ABNORMAL HIGH (ref 0.44–1.00)
GFR, Estimated: 6 mL/min — ABNORMAL LOW (ref >60)
Glucose, Bld: 234 mg/dL — ABNORMAL HIGH (ref 70–99)
Potassium: 4.4 mmol/L (ref 3.5–5.1)
Sodium: 135 mmol/L (ref 135–145)
Total Bilirubin: 0.4 mg/dL (ref 0.3–1.2)
Total Protein: 6.6 g/dL (ref 6.5–8.1)

## 2020-07-02 LAB — CBC WITH DIFFERENTIAL/PLATELET
Abs Immature Granulocytes: 0.01 10*3/uL (ref 0.00–0.07)
Basophils Absolute: 0.1 10*3/uL (ref 0.0–0.1)
Basophils Relative: 1 %
Eosinophils Absolute: 0.2 10*3/uL (ref 0.0–0.5)
Eosinophils Relative: 4 %
HCT: 33.3 % — ABNORMAL LOW (ref 36.0–46.0)
Hemoglobin: 10.1 g/dL — ABNORMAL LOW (ref 12.0–15.0)
Immature Granulocytes: 0 %
Lymphocytes Relative: 24 %
Lymphs Abs: 1.4 10*3/uL (ref 0.7–4.0)
MCH: 32.8 pg (ref 26.0–34.0)
MCHC: 30.3 g/dL (ref 30.0–36.0)
MCV: 108.1 fL — ABNORMAL HIGH (ref 80.0–100.0)
Monocytes Absolute: 0.5 10*3/uL (ref 0.1–1.0)
Monocytes Relative: 8 %
Neutro Abs: 3.7 10*3/uL (ref 1.7–7.7)
Neutrophils Relative %: 63 %
Platelets: 301 10*3/uL (ref 150–400)
RBC: 3.08 MIL/uL — ABNORMAL LOW (ref 3.87–5.11)
RDW: 15.9 % — ABNORMAL HIGH (ref 11.5–15.5)
WBC: 5.8 10*3/uL (ref 4.0–10.5)
nRBC: 0 % (ref 0.0–0.2)

## 2020-07-02 MED ORDER — BORTEZOMIB CHEMO SQ INJECTION 3.5 MG (2.5MG/ML)
1.3000 mg/m2 | Freq: Once | INTRAMUSCULAR | Status: AC
  Administered 2020-07-02: 2.25 mg via SUBCUTANEOUS
  Filled 2020-07-02: qty 0.9

## 2020-07-02 MED ORDER — DEXAMETHASONE 4 MG PO TABS
10.0000 mg | ORAL_TABLET | Freq: Once | ORAL | Status: AC
Start: 2020-07-02 — End: 2020-07-02
  Administered 2020-07-02: 10 mg via ORAL
  Filled 2020-07-02: qty 3

## 2020-07-02 MED ORDER — PROCHLORPERAZINE MALEATE 10 MG PO TABS
10.0000 mg | ORAL_TABLET | Freq: Once | ORAL | Status: AC
  Administered 2020-07-02: 10 mg via ORAL
  Filled 2020-07-02: qty 1

## 2020-07-02 NOTE — Progress Notes (Signed)
Pt here for D8C23 of velcade today.  No complaints to note today.  Vital signs WNL for treatment today. Labs WNL for treatment today.   Carly Wood presents today for injection per the provider's orders.  velcade administration without incident; injection site WNL; see MAR for injection details.  Patient tolerated procedure well and without incident.  No questions or complaints noted at this time. Pt stable during and after injection.  AVS reviewed.  Pt discharged in stable condition via wheelchair.

## 2020-07-02 NOTE — Patient Instructions (Signed)
[facility]  Discharge Instructions: Thank you for choosing [facility] to provide your oncology and hematology care.  If you have a lab appointment with the [facility], please come in thru the Main Entrance and check in at the main information desk.  Wear comfortable clothing and clothing appropriate for easy access to any Portacath or PICC line.   We strive to give you quality time with your provider. You may need to reschedule your appointment if you arrive late (15 or more minutes).  Arriving late affects you and other patients whose appointments are after yours.  Also, if you miss three or more appointments without notifying the office, you may be dismissed from the clinic at the provider's discretion.      For prescription refill requests, have your pharmacy contact our office and allow 72 hours for refills to be completed.    Today you received the following chemotherapy and/or immunotherapy agents velcade.  Return as scheduled.  Please call the clinic if you have any questions or concerns.    To help prevent nausea and vomiting after your treatment, we encourage you to take your nausea medication as directed.  BELOW ARE SYMPTOMS THAT SHOULD BE REPORTED IMMEDIATELY: . *FEVER GREATER THAN 100.4 F (38 C) OR HIGHER . *CHILLS OR SWEATING . *NAUSEA AND VOMITING THAT IS NOT CONTROLLED WITH YOUR NAUSEA MEDICATION . *UNUSUAL SHORTNESS OF BREATH . *UNUSUAL BRUISING OR BLEEDING . *URINARY PROBLEMS (pain or burning when urinating, or frequent urination) . *BOWEL PROBLEMS (unusual diarrhea, constipation, pain near the anus) . TENDERNESS IN MOUTH AND THROAT WITH OR WITHOUT PRESENCE OF ULCERS (sore throat, sores in mouth, or a toothache) . UNUSUAL RASH, SWELLING OR PAIN  . UNUSUAL VAGINAL DISCHARGE OR ITCHING   Items with * indicate a potential emergency and should be followed up as soon as possible or go to the Emergency Department if any problems should  occur.  Please show the CHEMOTHERAPY ALERT CARD or IMMUNOTHERAPY ALERT CARD at check-in to the Emergency Department and triage nurse.  Should you have questions after your visit or need to cancel or reschedule your appointment, please contact [facility] [phone]  and follow the prompts.  Office hours are 8:00 a.m. to 4:30 p.m. Monday - Friday. Please note that voicemails left after 4:00 p.m. may not be returned until the following business day.  We are closed weekends and major holidays. You have access to a nurse at all times for urgent questions. Please call the main number to the clinic [phone] and follow the prompts.  For any non-urgent questions, you may also contact your provider using MyChart. We now offer e-Visits for anyone 55 and older to request care online for non-urgent symptoms. For details visit [email or web address].   Also download the MyChart app! Go to the app store, search "MyChart", open the app, select [facility], and log in with your MyChart username and password.  Due to Covid, a mask is required upon entering the hospital/clinic. If you do not have a mask, one will be given to you upon arrival. For doctor visits, patients may have 1 support person aged 69 or older with them. For treatment visits, patients cannot have anyone with them due to current Covid guidelines and our immunocompromised population.

## 2020-07-03 DIAGNOSIS — N186 End stage renal disease: Secondary | ICD-10-CM | POA: Diagnosis not present

## 2020-07-03 DIAGNOSIS — Z992 Dependence on renal dialysis: Secondary | ICD-10-CM | POA: Diagnosis not present

## 2020-07-05 ENCOUNTER — Ambulatory Visit (HOSPITAL_COMMUNITY)
Admission: RE | Admit: 2020-07-05 | Discharge: 2020-07-05 | Disposition: A | Discharge status: Home / Self Care | Payer: Medicare PPO | Attending: Vascular Surgery | Admitting: Vascular Surgery

## 2020-07-05 ENCOUNTER — Inpatient Hospital Stay
Admission: RE | Admit: 2020-07-05 | Discharge: 2020-07-11 | Disposition: A | Discharge status: Other Acute Inpatient Hospital | Payer: Medicare PPO | Source: Ambulatory Visit | Attending: Internal Medicine | Admitting: Internal Medicine

## 2020-07-05 ENCOUNTER — Other Ambulatory Visit: Payer: Self-pay

## 2020-07-05 ENCOUNTER — Encounter (HOSPITAL_COMMUNITY)
Admission: RE | Disposition: A | Discharge status: Home / Self Care | Payer: Self-pay | Source: Home / Self Care | Attending: Vascular Surgery

## 2020-07-05 ENCOUNTER — Encounter (HOSPITAL_COMMUNITY): Payer: Self-pay | Admitting: Vascular Surgery

## 2020-07-05 DIAGNOSIS — T82898A Other specified complication of vascular prosthetic devices, implants and grafts, initial encounter: Secondary | ICD-10-CM | POA: Diagnosis not present

## 2020-07-05 DIAGNOSIS — Y841 Kidney dialysis as the cause of abnormal reaction of the patient, or of later complication, without mention of misadventure at the time of the procedure: Secondary | ICD-10-CM | POA: Diagnosis not present

## 2020-07-05 DIAGNOSIS — N186 End stage renal disease: Secondary | ICD-10-CM | POA: Diagnosis not present

## 2020-07-05 DIAGNOSIS — Z20822 Contact with and (suspected) exposure to covid-19: Secondary | ICD-10-CM | POA: Diagnosis not present

## 2020-07-05 DIAGNOSIS — Z880 Allergy status to penicillin: Secondary | ICD-10-CM | POA: Insufficient documentation

## 2020-07-05 DIAGNOSIS — T82858A Stenosis of vascular prosthetic devices, implants and grafts, initial encounter: Secondary | ICD-10-CM | POA: Insufficient documentation

## 2020-07-05 DIAGNOSIS — Z992 Dependence on renal dialysis: Secondary | ICD-10-CM | POA: Insufficient documentation

## 2020-07-05 HISTORY — PX: PERIPHERAL VASCULAR BALLOON ANGIOPLASTY: CATH118281

## 2020-07-05 HISTORY — PX: A/V FISTULAGRAM: CATH118298

## 2020-07-05 LAB — POCT I-STAT, CHEM 8
BUN: 59 mg/dL — ABNORMAL HIGH (ref 8–23)
Calcium, Ion: 0.76 mmol/L — CL (ref 1.15–1.40)
Chloride: 99 mmol/L (ref 98–111)
Creatinine, Ser: 9.9 mg/dL — ABNORMAL HIGH (ref 0.44–1.00)
Glucose, Bld: 111 mg/dL — ABNORMAL HIGH (ref 70–99)
HCT: 34 % — ABNORMAL LOW (ref 36.0–46.0)
Hemoglobin: 11.6 g/dL — ABNORMAL LOW (ref 12.0–15.0)
Potassium: 4.9 mmol/L (ref 3.5–5.1)
Sodium: 131 mmol/L — ABNORMAL LOW (ref 135–145)
TCO2: 24 mmol/L (ref 22–32)

## 2020-07-05 LAB — SARS CORONAVIRUS 2 BY RT PCR (HOSPITAL ORDER, PERFORMED IN ~~LOC~~ HOSPITAL LAB): SARS Coronavirus 2: NEGATIVE

## 2020-07-05 SURGERY — A/V FISTULAGRAM
Anesthesia: LOCAL | Laterality: Right

## 2020-07-05 MED ORDER — SODIUM CHLORIDE 0.9% FLUSH: 3.0000 mL | Freq: Two times a day (BID) | INTRAVENOUS | Status: DC

## 2020-07-05 MED ORDER — LIDOCAINE HCL (PF) 1 % IJ SOLN
INTRAMUSCULAR | Status: AC
  Filled 2020-07-05: qty 30

## 2020-07-05 MED ORDER — HEPARIN SODIUM (PORCINE) 1000 UNIT/ML IJ SOLN
INTRAMUSCULAR | Status: DC | PRN
  Administered 2020-07-05: 3000 [IU] via INTRAVENOUS

## 2020-07-05 MED ORDER — SODIUM CHLORIDE 0.9 % IV SOLN: 250.0000 mL | INTRAVENOUS | Status: DC | PRN

## 2020-07-05 MED ORDER — LIDOCAINE HCL (PF) 1 % IJ SOLN
INTRAMUSCULAR | Status: DC | PRN
  Administered 2020-07-05: 10 mL

## 2020-07-05 MED ORDER — SODIUM CHLORIDE 0.9% FLUSH: 3.0000 mL | INTRAVENOUS | Status: DC | PRN

## 2020-07-05 MED ORDER — HEPARIN (PORCINE) IN NACL 1000-0.9 UT/500ML-% IV SOLN
INTRAVENOUS | Status: DC | PRN
  Administered 2020-07-05: 500 mL

## 2020-07-05 MED ORDER — OXYCODONE HCL 5 MG PO TABS: 5.0000 mg | ORAL_TABLET | ORAL | Status: DC | PRN

## 2020-07-05 MED ORDER — HEPARIN (PORCINE) IN NACL 1000-0.9 UT/500ML-% IV SOLN
INTRAVENOUS | Status: AC
  Filled 2020-07-05: qty 500

## 2020-07-05 MED ORDER — ONDANSETRON HCL 4 MG/2ML IJ SOLN: 4.0000 mg | Freq: Four times a day (QID) | INTRAMUSCULAR | Status: DC | PRN

## 2020-07-05 MED ORDER — HEPARIN SODIUM (PORCINE) 1000 UNIT/ML IJ SOLN
INTRAMUSCULAR | Status: AC
  Filled 2020-07-05: qty 1

## 2020-07-05 MED ORDER — ACETAMINOPHEN 325 MG PO TABS: 650.0000 mg | ORAL_TABLET | ORAL | Status: DC | PRN

## 2020-07-05 MED ORDER — LABETALOL HCL 5 MG/ML IV SOLN: 10.0000 mg | INTRAVENOUS | Status: DC | PRN

## 2020-07-05 MED ORDER — HYDRALAZINE HCL 20 MG/ML IJ SOLN: 5.0000 mg | INTRAMUSCULAR | Status: DC | PRN

## 2020-07-05 MED ORDER — HYDROMORPHONE HCL 1 MG/ML IJ SOLN: 0.5000 mg | INTRAMUSCULAR | Status: DC | PRN

## 2020-07-05 MED ORDER — IODIXANOL 320 MG/ML IV SOLN
INTRAVENOUS | Status: DC | PRN
  Administered 2020-07-05: 50 mL

## 2020-07-05 SURGICAL SUPPLY — 15 items
BAG SNAP BAND KOVER 36X36 (MISCELLANEOUS) ×1 IMPLANT
BALLN ATHLETIS 10X40X75 (BALLOONS) ×2
BALLN ATHLETIS 10X80X75 (BALLOONS) ×2
BALLOON ATHLETIS 10X40X75 (BALLOONS) IMPLANT
BALLOON ATHLETIS 10X80X75 (BALLOONS) IMPLANT
CATH ANGIO 5F BER2 65CM (CATHETERS) ×1 IMPLANT
COVER DOME SNAP 22 D (MISCELLANEOUS) ×1 IMPLANT
KIT ENCORE 26 ADVANTAGE (KITS) ×1 IMPLANT
KIT MICROPUNCTURE NIT STIFF (SHEATH) ×1 IMPLANT
SHEATH PINNACLE R/O II 7F 4CM (SHEATH) ×1 IMPLANT
SHEATH PROBE COVER 6X72 (BAG) ×1 IMPLANT
STOPCOCK MORSE 400PSI 3WAY (MISCELLANEOUS) ×1 IMPLANT
TRAY PV CATH (CUSTOM PROCEDURE TRAY) ×2 IMPLANT
TUBING CIL FLEX 10 FLL-RA (TUBING) ×1 IMPLANT
WIRE HITORQ VERSACORE ST 145CM (WIRE) ×1 IMPLANT

## 2020-07-05 NOTE — Op Note (Signed)
Procedure: Right arm fistulogram, angioplasty right subclavian innominate vein superior vena cava  Preoperative diagnosis: Venous hypertension right arm  Postoperative diagnosis: Same  Anesthesia: Local  Operative findings: 1.  80% stenosis right subclavian and innominate and superior vena cava angioplastied up to a 10 mm balloon with complete elastic recoil and no significant improvement  Operative details: After obtaining informed consent, the patient was taken the PV lab.  The patient was placed in supine position the angio table.  Patient's right upper extremities prepped and draped in usual sterile fashion.  Local anesthesia was infiltrated over the right upper arm AV fistula.  Micropuncture needle was used to cannulate the right arm AV fistula under ultrasound guidance.  Micropuncture wire was advanced into the fistula.  Micropuncture sheath was placed over this.  Right arm fistulogram and central venogram was then performed through the sheath.  The superior vena cava innominate vein and subclavian veins on the right side are all patent.  However there were multiple segments areas of narrowing up to 80%.  There are abundant collaterals around the shoulder.  The remainder of the AV fistula is widely patent.  Compression on the outflow to reflux contrast across the arterial anastomosis showed this was widely patent as well.  At this point the sheath was accessed with an 035 versa core wire and advanced across the central vein stenosis into the right atrium.  This was assisted with a KMP catheter.  A 7 French sheath was then placed over the wire.  Patient was given 3000 units of intravenous heparin.  I then angioplastied 3 discrete lesions in the right subclavian right innominate and superior vena cava utilizing multiple inflations of a 10 x 40 balloon.  Due to the recoil nature of the vein the balloon Sliding forward.  Therefore I swapped this out for a 1080 balloon.  This was then inflated to 8 atm  for 1 minute.  Completion angiogram really showed no significant improvement and complete Recoil of the narrowing.  A similar procedure had been performed approximately 30 days ago.  In light of this I felt my options were to either stent her central vein potentially encroaching upon the left innominate system and jailing the right internal jugular vein or abandoning this in favor of placing a thigh graft in the near future.  I opted not to stent the central vein segment.  At this point the guidewire was removed the sheath was removed and the fistula opening closed with a single figure-of-eight Monocryl stitch.  Operative management: The patient has had previous access procedures which failed in the left arm.  She now has venous hypertension and multiple areas of narrowing in the right innominate superior vena cava on the right side.  I do not believe we are going to obtain significant durability of her right arm fistula even if we stent this segment.  We will schedule the patient for outpatient follow-up for consideration of a thigh graft.  Ruta Hinds, MD [facility] Office: [phone]

## 2020-07-05 NOTE — Interval H&P Note (Signed)
History and Physical Interval Note:  07/05/2020 8:56 AM  Carly Wood  has presented today for surgery, with the diagnosis of complication of fistula.  The various methods of treatment have been discussed with the patient and family. After consideration of risks, benefits and other options for treatment, the patient has consented to  Procedure(s): UPPER CENTRAL VENOGRAPHY (Right) as a surgical intervention.  The patient's history has been reviewed, patient examined, no change in status, stable for surgery.  I have reviewed the patient's chart and labs.  Questions were answered to the patient's satisfaction.     Ruta Hinds

## 2020-07-05 NOTE — Discharge Instructions (Signed)
Dialysis Fistulogram, Care After  UNSUCCESSFUL ATTEMPT TO OPEN VEIN ON RIGHT ARM FISTULA/ WILL NEED NEW ACCESS SITE  The following information offers guidance on how to care for yourself after your procedure. Your health care provider may also give you more specific instructions. If you have problems or questions, contact your health care provider. What can I expect after the procedure? After the procedure, it is common to have:  A small amount of discomfort in the area where the small tube (catheter) was placed for the procedure.  A small amount of bruising around the fistula.  Sleepiness and tiredness (fatigue). Follow these instructions at home: Puncture site care  Follow instructions from your health care provider about how to take care of the site where catheters were inserted. Make sure you: ? Wash your hands with soap and water for at least 20 seconds before and after you change your bandage (dressing). If soap and water are not available, use hand sanitizer. ? Change your dressing as told by your health care provider. ? Leave stitches (sutures), skin glue, or adhesive strips in place. These skin closures may need to stay in place for 2 weeks or longer. If adhesive strip edges start to loosen and curl up, you may trim the loose edges. Do not remove adhesive strips completely unless your health care provider tells you to do that.  Check your puncture area every day for signs of infection. Check for: ? More redness, swelling, or pain. ? Fluid or blood. ? Warmth. ? Pus or a bad smell.   Activity  Rest as much as you can.  If you were given a sedative during the procedure, it can affect you for several hours. Do not drive or operate machinery until your health care provider says that it is safe.  Do not lift anything that is heavier than 5 lb (2.3 kg), or the limit that you are told, on the day of your procedure.  Do not do anything strenuous with your arm for the rest of the day.  Avoid household activities, such as vacuuming.  Return to your normal activities as told by your health care provider. Ask your health care provider what activities are safe for you. Safety To prevent damage to your graft or fistula:  Do not wear tight-fitting clothing or jewelry on the arm or leg that has your graft or fistula.  Tell all your health care providers that you have a dialysis fistula or graft.  Do not allow blood draws, IVs, or blood pressure readings to be done in the arm that has your fistula or graft.  Do not allow flu shots or vaccinations in the arm with your fistula or graft. General instructions  Take over-the-counter and prescription medicines only as told by your health care provider.  Do not take baths, swim, or use a hot tub until your health care provider approves. Ask your health care provider if you may take showers. You may only be allowed to take sponge baths.  Monitor your dialysis fistula closely. Check to make sure that you can feel a vibration or buzz (a thrill) when you put your fingers over the fistula.  Keep all follow-up visits. This is important. Contact a health care provider if:  You have more redness, swelling, or pain at the site where the catheter was put in.  You have fluid or blood coming from the catheter site.  You have pus or a bad smell coming from the catheter site.  Your catheter  site feels warm.  You have a fever or chills. Get help right away if:  You have bleeding from the vascular access site that does not stop.  You feel weak.  You have trouble balancing.  You have trouble moving your arms or legs.  You have problems with your speech or vision.  You can no longer feel a vibration or buzz when you put your fingers over your fistula.  The limb that was used for the procedure swells or becomes painful, cold, blue, or pale white.  You have chest pain or shortness of breath. These symptoms may represent a serious  problem that is an emergency. Do not wait to see if the symptoms will go away. Get medical help right away. Call your local emergency services (911 in the [place] Do not drive yourself to the hospital. Summary  After a dialysis fistulogram, it is common to have a small amount of discomfort or bruising in the area where the small, thin tube (catheter) was placed.  Rest as much as you can after your procedure. Return to your normal activities as told by your health care provider.  Take over-the-counter and prescription medicines only as told by your health care provider.  Follow instructions from your health care provider about how to take care of the site where the catheter was inserted.  Keep all follow-up visits. This is important. This information is not intended to replace advice given to you by your health care provider. Make sure you discuss any questions you have with your health care provider. Document Revised: 10/04/2019 Document Reviewed: 10/04/2019 Elsevier Patient Education  [place].

## 2020-07-05 NOTE — Interval H&P Note (Signed)
History and Physical Interval Note:  07/05/2020 7:26 AM  Carly Wood  has presented today for surgery, with the diagnosis of complication of fistula.  The various methods of treatment have been discussed with the patient and family. After consideration of risks, benefits and other options for treatment, the patient has consented to  Procedure(s): UPPER CENTRAL VENOGRAPHY (Right) as a surgical intervention.  The patient's history has been reviewed, patient examined, no change in status, stable for surgery.  I have reviewed the patient's chart and labs.  Questions were answered to the patient's satisfaction.     Ruta Hinds

## 2020-07-06 DIAGNOSIS — N186 End stage renal disease: Secondary | ICD-10-CM | POA: Diagnosis not present

## 2020-07-06 DIAGNOSIS — Z992 Dependence on renal dialysis: Secondary | ICD-10-CM | POA: Diagnosis not present

## 2020-07-08 DIAGNOSIS — Z992 Dependence on renal dialysis: Secondary | ICD-10-CM | POA: Diagnosis not present

## 2020-07-08 DIAGNOSIS — N186 End stage renal disease: Secondary | ICD-10-CM | POA: Diagnosis not present

## 2020-07-08 DIAGNOSIS — Z23 Encounter for immunization: Secondary | ICD-10-CM | POA: Diagnosis not present

## 2020-07-10 DIAGNOSIS — Z23 Encounter for immunization: Secondary | ICD-10-CM | POA: Diagnosis not present

## 2020-07-10 DIAGNOSIS — Z992 Dependence on renal dialysis: Secondary | ICD-10-CM | POA: Diagnosis not present

## 2020-07-10 DIAGNOSIS — N186 End stage renal disease: Secondary | ICD-10-CM | POA: Diagnosis not present

## 2020-07-11 ENCOUNTER — Encounter (HOSPITAL_COMMUNITY): Payer: Self-pay | Admitting: Vascular Surgery

## 2020-07-11 ENCOUNTER — Inpatient Hospital Stay (HOSPITAL_COMMUNITY)
Admission: EM | Admit: 2020-07-11 | Discharge: 2020-07-17 | DRG: 480 | Disposition: A | Discharge status: Skilled Nursing Facility | Payer: Medicare PPO | Source: Skilled Nursing Facility | Attending: Family Medicine | Admitting: Family Medicine

## 2020-07-11 ENCOUNTER — Emergency Department (HOSPITAL_COMMUNITY): Payer: Medicare PPO

## 2020-07-11 ENCOUNTER — Other Ambulatory Visit: Payer: Self-pay

## 2020-07-11 DIAGNOSIS — Y92128 Other place in nursing home as the place of occurrence of the external cause: Secondary | ICD-10-CM

## 2020-07-11 DIAGNOSIS — Z170 Estrogen receptor positive status [ER+]: Secondary | ICD-10-CM

## 2020-07-11 DIAGNOSIS — E1122 Type 2 diabetes mellitus with diabetic chronic kidney disease: Secondary | ICD-10-CM | POA: Diagnosis not present

## 2020-07-11 DIAGNOSIS — I959 Hypotension, unspecified: Secondary | ICD-10-CM | POA: Diagnosis not present

## 2020-07-11 DIAGNOSIS — Z853 Personal history of malignant neoplasm of breast: Secondary | ICD-10-CM

## 2020-07-11 DIAGNOSIS — E785 Hyperlipidemia, unspecified: Secondary | ICD-10-CM | POA: Diagnosis present

## 2020-07-11 DIAGNOSIS — S72001A Fracture of unspecified part of neck of right femur, initial encounter for closed fracture: Secondary | ICD-10-CM | POA: Diagnosis present

## 2020-07-11 DIAGNOSIS — M1611 Unilateral primary osteoarthritis, right hip: Secondary | ICD-10-CM | POA: Diagnosis not present

## 2020-07-11 DIAGNOSIS — E1151 Type 2 diabetes mellitus with diabetic peripheral angiopathy without gangrene: Secondary | ICD-10-CM | POA: Diagnosis present

## 2020-07-11 DIAGNOSIS — S72141A Displaced intertrochanteric fracture of right femur, initial encounter for closed fracture: Secondary | ICD-10-CM | POA: Diagnosis not present

## 2020-07-11 DIAGNOSIS — F0151 Vascular dementia with behavioral disturbance: Secondary | ICD-10-CM | POA: Diagnosis present

## 2020-07-11 DIAGNOSIS — Z7982 Long term (current) use of aspirin: Secondary | ICD-10-CM | POA: Diagnosis not present

## 2020-07-11 DIAGNOSIS — F32A Depression, unspecified: Secondary | ICD-10-CM | POA: Diagnosis present

## 2020-07-11 DIAGNOSIS — N250 Renal osteodystrophy: Secondary | ICD-10-CM | POA: Diagnosis not present

## 2020-07-11 DIAGNOSIS — W010XXA Fall on same level from slipping, tripping and stumbling without subsequent striking against object, initial encounter: Secondary | ICD-10-CM | POA: Diagnosis present

## 2020-07-11 DIAGNOSIS — E1169 Type 2 diabetes mellitus with other specified complication: Secondary | ICD-10-CM | POA: Diagnosis not present

## 2020-07-11 DIAGNOSIS — Z992 Dependence on renal dialysis: Secondary | ICD-10-CM | POA: Diagnosis present

## 2020-07-11 DIAGNOSIS — Z7984 Long term (current) use of oral hypoglycemic drugs: Secondary | ICD-10-CM

## 2020-07-11 DIAGNOSIS — Y9301 Activity, walking, marching and hiking: Secondary | ICD-10-CM | POA: Diagnosis present

## 2020-07-11 DIAGNOSIS — I70209 Unspecified atherosclerosis of native arteries of extremities, unspecified extremity: Secondary | ICD-10-CM | POA: Diagnosis not present

## 2020-07-11 DIAGNOSIS — M25561 Pain in right knee: Secondary | ICD-10-CM | POA: Diagnosis not present

## 2020-07-11 DIAGNOSIS — C50911 Malignant neoplasm of unspecified site of right female breast: Secondary | ICD-10-CM | POA: Diagnosis present

## 2020-07-11 DIAGNOSIS — Z20822 Contact with and (suspected) exposure to covid-19: Secondary | ICD-10-CM | POA: Diagnosis not present

## 2020-07-11 DIAGNOSIS — I10 Essential (primary) hypertension: Secondary | ICD-10-CM | POA: Diagnosis not present

## 2020-07-11 DIAGNOSIS — J9811 Atelectasis: Secondary | ICD-10-CM | POA: Diagnosis not present

## 2020-07-11 DIAGNOSIS — E875 Hyperkalemia: Secondary | ICD-10-CM | POA: Diagnosis present

## 2020-07-11 DIAGNOSIS — Z794 Long term (current) use of insulin: Secondary | ICD-10-CM

## 2020-07-11 DIAGNOSIS — K219 Gastro-esophageal reflux disease without esophagitis: Secondary | ICD-10-CM | POA: Diagnosis present

## 2020-07-11 DIAGNOSIS — I120 Hypertensive chronic kidney disease with stage 5 chronic kidney disease or end stage renal disease: Secondary | ICD-10-CM | POA: Diagnosis present

## 2020-07-11 DIAGNOSIS — W19XXXA Unspecified fall, initial encounter: Secondary | ICD-10-CM

## 2020-07-11 DIAGNOSIS — I700 Atherosclerosis of aorta: Secondary | ICD-10-CM | POA: Diagnosis not present

## 2020-07-11 DIAGNOSIS — F0390 Unspecified dementia without behavioral disturbance: Secondary | ICD-10-CM | POA: Diagnosis not present

## 2020-07-11 DIAGNOSIS — S72001D Fracture of unspecified part of neck of right femur, subsequent encounter for closed fracture with routine healing: Secondary | ICD-10-CM | POA: Diagnosis not present

## 2020-07-11 DIAGNOSIS — C9000 Multiple myeloma not having achieved remission: Secondary | ICD-10-CM | POA: Diagnosis not present

## 2020-07-11 DIAGNOSIS — S72001S Fracture of unspecified part of neck of right femur, sequela: Secondary | ICD-10-CM | POA: Diagnosis not present

## 2020-07-11 DIAGNOSIS — N186 End stage renal disease: Secondary | ICD-10-CM | POA: Diagnosis present

## 2020-07-11 DIAGNOSIS — R0600 Dyspnea, unspecified: Secondary | ICD-10-CM

## 2020-07-11 DIAGNOSIS — D6481 Anemia due to antineoplastic chemotherapy: Secondary | ICD-10-CM | POA: Diagnosis not present

## 2020-07-11 DIAGNOSIS — F01518 Vascular dementia, unspecified severity, with other behavioral disturbance: Secondary | ICD-10-CM | POA: Diagnosis present

## 2020-07-11 DIAGNOSIS — Z9011 Acquired absence of right breast and nipple: Secondary | ICD-10-CM

## 2020-07-11 DIAGNOSIS — Z9221 Personal history of antineoplastic chemotherapy: Secondary | ICD-10-CM | POA: Diagnosis not present

## 2020-07-11 DIAGNOSIS — S72143A Displaced intertrochanteric fracture of unspecified femur, initial encounter for closed fracture: Secondary | ICD-10-CM

## 2020-07-11 DIAGNOSIS — E1129 Type 2 diabetes mellitus with other diabetic kidney complication: Secondary | ICD-10-CM | POA: Diagnosis not present

## 2020-07-11 DIAGNOSIS — Z79899 Other long term (current) drug therapy: Secondary | ICD-10-CM | POA: Diagnosis not present

## 2020-07-11 DIAGNOSIS — S72141D Displaced intertrochanteric fracture of right femur, subsequent encounter for closed fracture with routine healing: Secondary | ICD-10-CM | POA: Diagnosis not present

## 2020-07-11 DIAGNOSIS — F419 Anxiety disorder, unspecified: Secondary | ICD-10-CM | POA: Diagnosis present

## 2020-07-11 DIAGNOSIS — F0391 Unspecified dementia with behavioral disturbance: Secondary | ICD-10-CM | POA: Diagnosis not present

## 2020-07-11 DIAGNOSIS — D631 Anemia in chronic kidney disease: Secondary | ICD-10-CM | POA: Diagnosis present

## 2020-07-11 DIAGNOSIS — J811 Chronic pulmonary edema: Secondary | ICD-10-CM | POA: Diagnosis not present

## 2020-07-11 DIAGNOSIS — D62 Acute posthemorrhagic anemia: Secondary | ICD-10-CM | POA: Diagnosis not present

## 2020-07-11 DIAGNOSIS — R0602 Shortness of breath: Secondary | ICD-10-CM | POA: Diagnosis not present

## 2020-07-11 DIAGNOSIS — E8889 Other specified metabolic disorders: Secondary | ICD-10-CM | POA: Diagnosis present

## 2020-07-11 DIAGNOSIS — S72041A Displaced fracture of base of neck of right femur, initial encounter for closed fracture: Secondary | ICD-10-CM | POA: Diagnosis not present

## 2020-07-11 LAB — CBC WITH DIFFERENTIAL/PLATELET
Abs Immature Granulocytes: 0.06 10*3/uL (ref 0.00–0.07)
Basophils Absolute: 0 10*3/uL (ref 0.0–0.1)
Basophils Relative: 0 %
Eosinophils Absolute: 0.2 10*3/uL (ref 0.0–0.5)
Eosinophils Relative: 2 %
HCT: 34.6 % — ABNORMAL LOW (ref 36.0–46.0)
Hemoglobin: 10.6 g/dL — ABNORMAL LOW (ref 12.0–15.0)
Immature Granulocytes: 1 %
Lymphocytes Relative: 11 %
Lymphs Abs: 1 10*3/uL (ref 0.7–4.0)
MCH: 33.4 pg (ref 26.0–34.0)
MCHC: 30.6 g/dL (ref 30.0–36.0)
MCV: 109.1 fL — ABNORMAL HIGH (ref 80.0–100.0)
Monocytes Absolute: 1.1 10*3/uL — ABNORMAL HIGH (ref 0.1–1.0)
Monocytes Relative: 12 %
Neutro Abs: 6.7 10*3/uL (ref 1.7–7.7)
Neutrophils Relative %: 74 %
Platelets: 248 10*3/uL (ref 150–400)
RBC: 3.17 MIL/uL — ABNORMAL LOW (ref 3.87–5.11)
RDW: 15.9 % — ABNORMAL HIGH (ref 11.5–15.5)
WBC: 9.1 10*3/uL (ref 4.0–10.5)
nRBC: 0 % (ref 0.0–0.2)

## 2020-07-11 LAB — COMPREHENSIVE METABOLIC PANEL
ALT: 15 U/L (ref 0–44)
AST: 33 U/L (ref 15–41)
Albumin: 3.5 g/dL (ref 3.5–5.0)
Alkaline Phosphatase: 68 U/L (ref 38–126)
Anion gap: 11 (ref 5–15)
BUN: 45 mg/dL — ABNORMAL HIGH (ref 8–23)
CO2: 30 mmol/L (ref 22–32)
Calcium: 8.1 mg/dL — ABNORMAL LOW (ref 8.9–10.3)
Chloride: 93 mmol/L — ABNORMAL LOW (ref 98–111)
Creatinine, Ser: 7.71 mg/dL — ABNORMAL HIGH (ref 0.44–1.00)
GFR, Estimated: 5 mL/min — ABNORMAL LOW (ref >60)
Glucose, Bld: 79 mg/dL (ref 70–99)
Potassium: 4.9 mmol/L (ref 3.5–5.1)
Sodium: 134 mmol/L — ABNORMAL LOW (ref 135–145)
Total Bilirubin: 0.7 mg/dL (ref 0.3–1.2)
Total Protein: 6.8 g/dL (ref 6.5–8.1)

## 2020-07-11 LAB — GLUCOSE, CAPILLARY: Glucose-Capillary: 165 mg/dL — ABNORMAL HIGH (ref 70–99)

## 2020-07-11 LAB — MAGNESIUM: Magnesium: 2.4 mg/dL (ref 1.7–2.4)

## 2020-07-11 LAB — CBG MONITORING, ED: Glucose-Capillary: 136 mg/dL — ABNORMAL HIGH (ref 70–99)

## 2020-07-11 MED ORDER — AMLODIPINE BESYLATE 5 MG PO TABS
10.0000 mg | ORAL_TABLET | ORAL | Status: DC
  Administered 2020-07-14 – 2020-07-16 (×2): 10 mg via ORAL
  Filled 2020-07-11 (×2): qty 2

## 2020-07-11 MED ORDER — ASPIRIN EC 81 MG PO TBEC: 81.0000 mg | DELAYED_RELEASE_TABLET | Freq: Every day | ORAL | Status: DC

## 2020-07-11 MED ORDER — MORPHINE SULFATE (PF) 2 MG/ML IV SOLN
2.0000 mg | Freq: Once | INTRAVENOUS | Status: AC
  Administered 2020-07-11: 2 mg via INTRAVENOUS
  Filled 2020-07-11: qty 1

## 2020-07-11 MED ORDER — HYDROMORPHONE HCL 1 MG/ML IJ SOLN
0.5000 mg | Freq: Once | INTRAMUSCULAR | Status: AC
  Administered 2020-07-11: 0.5 mg via INTRAVENOUS
  Filled 2020-07-11: qty 0.5

## 2020-07-11 MED ORDER — POLYETHYLENE GLYCOL 3350 17 G PO PACK: 17.0000 g | PACK | Freq: Every day | ORAL | Status: DC | PRN

## 2020-07-11 MED ORDER — CALCIUM ACETATE (PHOS BINDER) 667 MG PO CAPS
1334.0000 mg | ORAL_CAPSULE | Freq: Two times a day (BID) | ORAL | Status: DC
  Administered 2020-07-13 – 2020-07-17 (×8): 1334 mg via ORAL
  Filled 2020-07-11 (×10): qty 2

## 2020-07-11 MED ORDER — ONDANSETRON HCL 4 MG PO TABS: 4.0000 mg | ORAL_TABLET | Freq: Four times a day (QID) | ORAL | Status: DC | PRN

## 2020-07-11 MED ORDER — HYDROMORPHONE HCL 1 MG/ML IJ SOLN
0.5000 mg | INTRAMUSCULAR | Status: DC | PRN
Start: 2020-07-11 — End: 2020-07-17
  Administered 2020-07-12 – 2020-07-14 (×8): 0.5 mg via INTRAVENOUS
  Filled 2020-07-11 (×8): qty 0.5

## 2020-07-11 MED ORDER — CLONIDINE HCL 0.2 MG PO TABS
0.3000 mg | ORAL_TABLET | Freq: Two times a day (BID) | ORAL | Status: DC
  Administered 2020-07-12 – 2020-07-17 (×9): 0.3 mg via ORAL
  Filled 2020-07-11 (×10): qty 1

## 2020-07-11 MED ORDER — ONDANSETRON HCL 4 MG/2ML IJ SOLN: 4.0000 mg | Freq: Four times a day (QID) | INTRAMUSCULAR | Status: DC | PRN

## 2020-07-11 MED ORDER — ATORVASTATIN CALCIUM 10 MG PO TABS
10.0000 mg | ORAL_TABLET | Freq: Every day | ORAL | Status: DC
  Administered 2020-07-12 – 2020-07-16 (×5): 10 mg via ORAL
  Filled 2020-07-11 (×6): qty 1

## 2020-07-11 MED ORDER — SERTRALINE HCL 50 MG PO TABS
50.0000 mg | ORAL_TABLET | Freq: Every day | ORAL | Status: DC
  Administered 2020-07-13 – 2020-07-17 (×5): 50 mg via ORAL
  Filled 2020-07-11 (×6): qty 1

## 2020-07-11 MED ORDER — MELATONIN 3 MG PO TABS
6.0000 mg | ORAL_TABLET | Freq: Every day | ORAL | Status: DC
  Administered 2020-07-12 – 2020-07-16 (×5): 6 mg via ORAL
  Filled 2020-07-11 (×6): qty 2

## 2020-07-11 MED ORDER — CALCIUM CARBONATE-VITAMIN D 500-200 MG-UNIT PO TABS
1.0000 | ORAL_TABLET | Freq: Every day | ORAL | Status: DC
  Administered 2020-07-13 – 2020-07-17 (×5): 1 via ORAL
  Filled 2020-07-11 (×6): qty 1

## 2020-07-11 MED ORDER — TRANEXAMIC ACID-NACL 1000-0.7 MG/100ML-% IV SOLN
1000.0000 mg | INTRAVENOUS | Status: AC
  Filled 2020-07-11: qty 100

## 2020-07-11 MED ORDER — INSULIN ASPART 100 UNIT/ML IJ SOLN
0.0000 [IU] | Freq: Four times a day (QID) | INTRAMUSCULAR | Status: DC
  Administered 2020-07-12 – 2020-07-13 (×2): 2 [IU] via SUBCUTANEOUS
  Administered 2020-07-13: 5 [IU] via SUBCUTANEOUS
  Administered 2020-07-13 – 2020-07-14 (×3): 2 [IU] via SUBCUTANEOUS
  Administered 2020-07-15 (×2): 1 [IU] via SUBCUTANEOUS
  Administered 2020-07-16: 3 [IU] via SUBCUTANEOUS
  Administered 2020-07-16: 2 [IU] via SUBCUTANEOUS
  Administered 2020-07-17: 1 [IU] via SUBCUTANEOUS

## 2020-07-11 MED ORDER — CLINDAMYCIN PHOSPHATE 900 MG/50ML IV SOLN
900.0000 mg | INTRAVENOUS | Status: AC
  Administered 2020-07-12: 900 mg via INTRAVENOUS
  Filled 2020-07-11 (×2): qty 50

## 2020-07-11 MED ORDER — ACETAMINOPHEN 650 MG RE SUPP: 650.0000 mg | Freq: Four times a day (QID) | RECTAL | Status: DC | PRN

## 2020-07-11 MED ORDER — ACETAMINOPHEN 325 MG PO TABS
650.0000 mg | ORAL_TABLET | Freq: Four times a day (QID) | ORAL | Status: DC | PRN
  Administered 2020-07-15 – 2020-07-16 (×2): 650 mg via ORAL
  Filled 2020-07-11 (×2): qty 2

## 2020-07-11 MED ORDER — ANASTROZOLE 1 MG PO TABS
1.0000 mg | ORAL_TABLET | Freq: Every day | ORAL | Status: DC
  Administered 2020-07-13 – 2020-07-17 (×5): 1 mg via ORAL
  Filled 2020-07-11 (×8): qty 1

## 2020-07-11 MED ORDER — ACYCLOVIR 200 MG PO CAPS
200.0000 mg | ORAL_CAPSULE | Freq: Every day | ORAL | Status: DC
  Administered 2020-07-13 – 2020-07-17 (×5): 200 mg via ORAL
  Filled 2020-07-11 (×6): qty 1

## 2020-07-11 NOTE — H&P (Addendum)
History and Physical    Carly Wood PHX:[id number] DOB: [birth year] DOA: 07/11/2020  PCP: Gerlene Fee, NP   Patient coming from: [facility]  I have personally briefly reviewed patient's old medical records in [place]  Chief Complaint: Fall,   HPI: Carly Wood is a 85 y.o. female with medical history significant for multiple myeloma, diabetes mellitus, ESRD, dementia, hypertension gastric ulcer. Patient presented to the ED from [facility] facility with reports of fall.  Patient is awake alert and oriented and able to give me history.  She reports she was walking she turned around, and lost her balance and fell.  She reports right knee pain.  Patient did not lose consciousness, she did not hit her head.  ED Course: Stable vitals.  Unremarkable CBC.  BUN/creatinine elevated ESRD patient.  Knee x-rays negative.  Portable chest x-ray unremarkable.  Pelvic x-ray-acute impacted right basicervical femoral neck fracture. EDP talked to Dr. Colon Branch, will see in consult.  Review of Systems: As per HPI all other systems reviewed and negative.  Past Medical History:  Diagnosis Date  . Anemia   . Anxiety   . Chronic kidney disease   . Chronic renal disease, stage 4, severely decreased glomerular filtration rate (GFR) between 15-29 mL/min/1.73 square meter (HCC) 08/22/2015  . Complication of anesthesia    delirious after Breast Surgery  . Dementia ([place])    mild  . Depression   . Diabetes mellitus without complication ([place])    type II  . Dyspnea    with activity  . GERD (gastroesophageal reflux disease)   . Glaucoma   . Hypertension   . Pneumonia   . Stage 1 infiltrating ductal carcinoma of right female breast ([place]) 08/21/2015   ER+ PR+ HER 2 neu + (3+) T1cN0     Past Surgical History:  Procedure Laterality Date  . A/V FISTULAGRAM Right 07/05/2020   Procedure: A/V Fistulagram;  Surgeon: Elam Dutch, MD;  Location: [place] [facility];  Service: Cardiovascular;   Laterality: Right;  . AV FISTULA PLACEMENT Left 11/22/2017   Procedure: ARTERIOVENOUS (AV) FISTULA CREATION LEFT ARM;  Surgeon: Elam Dutch, MD;  Location: [facility] OR;  Service: Vascular;  Laterality: Left;  . AV FISTULA PLACEMENT Right 04/04/2020   Procedure: RIGHT ARM ARTERIOVENOUS FISTULA CREATION;  Surgeon: Rosetta Posner, MD;  Location: [facility];  Service: Vascular;  Laterality: Right;  . BIOPSY  08/07/2016   Procedure: BIOPSY;  Surgeon: Daneil Dolin, MD;  [facility];  Service: Endoscopy;;  gastric ulcer biopsy  . COLONOSCOPY    . ESOPHAGOGASTRODUODENOSCOPY N/A 08/07/2016   Procedure: ESOPHAGOGASTRODUODENOSCOPY (EGD);  Surgeon: Daneil Dolin, MD;  [facility];  Service: Endoscopy;  Laterality: N/A;  1215-rescheduled to 6/1 @ 2:30pm per Tretha Sciara  . ESOPHAGOGASTRODUODENOSCOPY N/A 11/27/2016   Procedure: ESOPHAGOGASTRODUODENOSCOPY (EGD);  Surgeon: Daneil Dolin, MD;  [facility];  Service: Endoscopy;  Laterality: N/A;  8:15am  . FISTULA SUPERFICIALIZATION Left 02/14/2018   Procedure: FISTULA SUPERFICIALIZATION LEFT ARM;  Surgeon: Angelia Mould, MD;  Location: [place];  Service: Vascular;  Laterality: Left;  . FRACTURE SURGERY Right    ankle  . MALONEY DILATION N/A 08/07/2016   Procedure: Venia Minks DILATION;  Surgeon: Daneil Dolin, MD;  [facility];  Service: Endoscopy;  Laterality: N/A;  . MASTECTOMY, PARTIAL Right   . PERIPHERAL VASCULAR BALLOON ANGIOPLASTY Left 07/13/2019   Procedure: PERIPHERAL VASCULAR BALLOON ANGIOPLASTY;  Surgeon: Monica Martinez  J, MD;  Location: [place] [facility];  Service: Cardiovascular;  Laterality: Left;  arm fistulogram  . PERIPHERAL VASCULAR BALLOON ANGIOPLASTY Right 05/22/2020   Procedure: PERIPHERAL VASCULAR BALLOON ANGIOPLASTY;  Surgeon: Cherre Robins, MD;  Location: [place] [facility];  Service: Cardiovascular;  Laterality: Right;  arm fistula  . PERIPHERAL VASCULAR BALLOON ANGIOPLASTY Right 07/05/2020    Procedure: PERIPHERAL VASCULAR BALLOON ANGIOPLASTY;  Surgeon: Elam Dutch, MD;  Location: [place] [facility];  Service: Cardiovascular;  Laterality: Right;  arm fistula  . PORT-A-CATH REMOVAL Left 11/22/2017   Procedure: REMOVAL PORT-A-CATH LEFT CHEST;  Surgeon: Elam Dutch, MD;  Location: [facility] OR;  Service: Vascular;  Laterality: Left;  . RETINAL DETACHMENT SURGERY Right      reports that she has never smoked. She has never used smokeless tobacco. She reports that she does not drink alcohol and does not use drugs.  Allergies  Allergen Reactions  . Ace Inhibitors Other (See Comments) and Cough    Tongue swell  . Penicillins Other (See Comments)    Unsure of reaction Has patient had a PCN reaction causing immediate rash, facial/tongue/throat swelling, SOB or lightheadedness with hypotension: Unknown Has patient had a PCN reaction causing severe rash involving mucus membranes or skin necrosis: Unknown Has patient had a PCN reaction that required hospitalization: No Has patient had a PCN reaction occurring within the last 10 years: Unknown If all of the above answers are "NO", then may proceed with Cephalosporin use.      Family History  Problem Relation Age of Onset  . Multiple myeloma Sister   . Brain cancer Sister   . Dementia Mother        died at 45  . Stroke Mother   . Heart failure Mother   . Diabetes Mother   . Heart disease Father   . Prostate cancer Brother   . Colon cancer Neg Hx     Prior to Admission medications   Medication Sig Start Date End Date Taking? Authorizing Provider  acetaminophen (TYLENOL) 650 MG CR tablet Take 650 mg by mouth every 6 (six) hours. 01/09/20  Yes [provider]  acyclovir (ZOVIRAX) 200 MG capsule Take 200 mg by mouth daily. 10/20/19  Yes [provider]  amLODipine (NORVASC) 10 MG tablet Take 10 mg by mouth daily. Not taking on dialysis days 01/25/18  Yes [provider]  anastrozole (ARIMIDEX) 1 MG  tablet TAKE 1 TABLET BY MOUTH DAILY Patient taking differently: Take 1 mg by mouth daily. 10/30/19  Yes Derek Jack, MD  aspirin EC 81 MG tablet Take 81 mg by mouth daily.   Yes [provider]  atorvastatin (LIPITOR) 10 MG tablet Take 10 mg by mouth at bedtime.   Yes [provider]  calcium acetate (PHOSLO) 667 MG capsule Take 667-1,334 mg by mouth See admin instructions. Take 1,334 mg twice daily with meals and 667 with snacks daily   Yes [provider]  calcium-vitamin D (OSCAL WITH D) 500-200 MG-UNIT tablet Take 1 tablet by mouth daily. At 8 am   Yes [provider]  cloNIDine (CATAPRES) 0.3 MG tablet Take 0.3 mg by mouth 2 (two) times daily.    Yes [provider]  insulin aspart (NOVOLOG FLEXPEN) 100 UNIT/ML FlexPen Inject 10 Units into the skin 2 (two) times daily. 8 am and 5 pm, Hold for accu-check less than 150. 01/15/20  Yes [provider]  insulin aspart (NOVOLOG FLEXPEN) 100 UNIT/ML FlexPen Inject 15  Units into the skin 2 (two) times daily. Hold for accu-check less than 150   Yes [provider]  insulin glargine (LANTUS SOLOSTAR) 100 UNIT/ML Solostar Pen Inject 10 Units into the skin at bedtime. 01/23/20  Yes [provider]  melatonin 3 MG TABS tablet Take 6 mg by mouth at bedtime. For Sleep 10/17/19  Yes [provider]  Multiple Vitamin (MULTIVITAMIN) capsule Take 1 capsule by mouth daily.   Yes [provider]  sertraline (ZOLOFT) 50 MG tablet Take 50 mg by mouth daily.   Yes [provider]  NON FORMULARY Diet: _____ Regular, ___x___ NAS, _______Consistent Carbohydrate, _______NPO __x___Other Low Potassium Patient not taking: Reported on 07/11/2020 12/22/19   [provider]  Nutritional Supplements (NEPRO PO) Take 1 Can by mouth at bedtime. Patient not taking: Reported on 07/11/2020 11/11/19   [provider]    Physical Exam: Vitals:   07/11/20 1406  07/11/20 1411 07/11/20 1630 07/11/20 1700  BP:  (!) 104/52 (!) 122/53 (!) 121/54  Pulse:   77 77  Resp:  12 (!) 0 (!) 0  SpO2:   97% 98%  Weight: 67.1 kg     Height: '5\' 3"'  (1.6 m)       Constitutional: NAD, calm, comfortable Vitals:   07/11/20 1406 07/11/20 1411 07/11/20 1630 07/11/20 1700  BP:  (!) 104/52 (!) 122/53 (!) 121/54  Pulse:   77 77  Resp:  12 (!) 0 (!) 0  SpO2:   97% 98%  Weight: 67.1 kg     Height: '5\' 3"'  (1.6 m)      Eyes: PERRL, lids and conjunctivae normal ENMT: Mucous membranes are moist. Neck: normal, supple, no masses, no thyromegaly Respiratory: clear to auscultation bilaterally, no wheezing, no crackles. Normal respiratory effort. No accessory muscle use.  Cardiovascular: Regular rate and rhythm, no murmurs / rubs / gallops. No extremity edema. 2+ pedal pulses.  Abdomen: no tenderness, no masses palpated. No hepatosplenomegaly. Bowel sounds positive.  Musculoskeletal: no clubbing / cyanosis. No joint deformity upper and lower extremities. Good ROM, no contractures.  Skin: no rashes, lesions, ulcers. No induration Neurologic: No apparent cranial abnormality, moving extremities spontaneously. Psychiatric: Normal judgment and insight. Alert and oriented x 3. Normal mood.   Labs on Admission: I have personally reviewed following labs and imaging studies  CBC: Recent Labs  Lab 07/05/20 0842 07/11/20 1642  WBC  --  9.1  NEUTROABS  --  6.7  HGB 11.6* 10.6*  HCT 34.0* 34.6*  MCV  --  109.1*  PLT  --  888   Basic Metabolic Panel: Recent Labs  Lab 07/05/20 0842 07/11/20 1602 07/11/20 1750  NA 131* 134*  --   K 4.9 4.9  --   CL 99 93*  --   CO2  --  30  --   GLUCOSE 111* 79  --   BUN 59* 45*  --   CREATININE 9.90* 7.71*  --   CALCIUM  --  8.1*  --   MG  --   --  2.4   Liver Function Tests: Recent Labs  Lab 07/11/20 1602  AST 33  ALT 15  ALKPHOS 68  BILITOT 0.7  PROT 6.8  ALBUMIN 3.5    Radiological Exams on Admission: DG Chest 1  View  Result Date: 07/11/2020 CLINICAL DATA:  Pain following fall EXAM: CHEST  1 VIEW COMPARISON:  November 22, 2017 FINDINGS: Central catheter tip is at the cavoatrial junction. No pneumothorax. There is slight  left base atelectasis. Lungs elsewhere clear. Heart is upper normal in size with pulmonary vascularity normal. There is aortic atherosclerosis. No adenopathy. No bone lesions. There are surgical clips in the right axilla. IMPRESSION: Central catheter tip at cavoatrial junction. No pneumothorax. Mild left base atelectasis. No edema or airspace opacity. Stable cardiac silhouette. Aortic Atherosclerosis (ICD10-I70.0). Electronically Signed   By: Lowella Grip III M.D.   On: 07/11/2020 15:14   DG Knee 1-2 Views Right  Result Date: 07/11/2020 CLINICAL DATA:  Recent fall with right knee pain, initial encounter EXAM: RIGHT KNEE - 2 VIEW COMPARISON:  None. FINDINGS: No acute fracture or dislocation is noted. Diffuse vascular calcifications are seen. No soft tissue abnormality is noted. IMPRESSION: No acute abnormality noted. Electronically Signed   By: Inez Catalina M.D.   On: 07/11/2020 15:13   DG Hip Unilat  With Pelvis 2-3 Views Right  Result Date: 07/11/2020 CLINICAL DATA:  Fall, right hip deformity EXAM: DG HIP (WITH OR WITHOUT PELVIS) 2-3V RIGHT COMPARISON:  None. FINDINGS: Single view radiograph pelvis and two view radiograph of the right hip demonstrates an acute, mildly impacted, angulated basicervical right femoral neck fracture with mild posterior and moderate varus angulation. The femoral head is still seated within the right acetabulum. Mild-to-moderate degenerative hip arthritis is present with joint space narrowing noted bilaterally. The pelvis and left hip are intact. Multiple involuted fibroids are seen within the pelvis. Advanced vascular calcifications are seen within the pelvis and medial thighs. IMPRESSION: Acute, impacted, angulated right basicervical femoral neck fracture.  Electronically Signed   By: Fidela Salisbury MD   On: 07/11/2020 15:14    EKG: Sinus rhythm, rate 88, QTc 475.  No significant change from prior.  Assessment/Plan Principal Problem:   Closed right hip fracture (HCC) Active Problems:   Stage 1 infiltrating ductal carcinoma of right female breast (HCC)   Multiple myeloma (HCC)   Diabetes mellitus with ESRD (end-stage renal disease) ([place])   Vascular dementia with behavior disturbance (HCC)    Right hip fracture s/p mechanical fall- Xray hip- Acute, impacted, angulated right basicervical femoral neck fracture.  -Dr. Regino Bellow orthopedist to see in a.m. -N.p.o. midnight -IV Dilaudid 0.5 mg as needed  ESRD-schedule Monday Wednesday Friday.  Last HD was yesterday Wednesday. -Resume PhosLo and Oscal  Diabetes mellitus-random glucose 79> 136. HgbA1c- 6.2. -Hold Lantus - SSI- S Q6h  Breast cancer s/p right mastectomy. -Resume anastrozole  Multiple myeloma-follows with Dr. Delton Coombes, currently on chemotherapy with Velcade and dexamethasone.  DVT prophylaxis: Scds for now Code Status: Full code Family Communication: None at bedside Disposition Plan: >/ ~ 2 days Consults called: None Admission status: Inpt, tele I certify that at the point of admission it is my clinical judgment that the patient will require inpatient hospital care spanning beyond 2 midnights from the point of admission due to high intensity of service, high risk for further deterioration and high frequency of surveillance required.    Bethena Roys MD [facility]  07/11/2020, 7:34 PM

## 2020-07-11 NOTE — Consult Note (Signed)
ORTHOPAEDIC CONSULTATION  REQUESTING PHYSICIAN: Roxan Hockey, MD  ASSESSMENT AND PLAN: 85 y.o. female with the following: Right Hip Intertrochanteric femur fracture  This patient requires inpatient admission to the hospitalist, to include preoperative clearance and perioperative medical management  - Weight Bearing Status/Activity: NWB Right lower extremity  - Additional recommended labs/tests: Preop Labs: CBC, BMP, PT/INR, Chest XR and EKG  -VTE Prophylaxis: Please hold prior to OR; to resume POD#1 at the discretion of the primary team  - Pain control: Recommend PO pain medications PRN; judicious use of narcotics  - Follow-up plan: F/u 10-14 days postop  -Procedures: Plan for OR once patient has been medically optimized  Plan for Right Hip Cephalomedullary nail  NPO at midnight. Hold anticoagulation.  Risks and benefits of surgery, including, but not limited to infection, bleeding, persistent pain, damage to surrounding structures, need for further surgery, malunion, nonunion and more severe complications associated with anesthesia were discussed.  All questions have been answered and they have elected to proceed with surgery.    She is at an increased risk for infection and poor healing due to her history of diabetes and end stage renal disease.   She will need to be evaluated by nephrology as her usual dialysis schedule is Monday, Wednesday, Friday.    Chief Complaint: Right hip pain  HPI: Carly Wood is a 85 y.o. female who presented to the ED for evaluation after sustaining a mechanical fall.  She recently had a vascular procedure and was recovering at the [facility], when she fell.  She landed on her right hip.  She is complaining of right hip pain.  Pain worsens with movement.  She is having occasional spasm pain.  She is having difficulty sleeping due to the pain.  If she lays still, her pain improves.  She denies numbness and tingling.   Past Medical  History:  Diagnosis Date  . Anemia   . Anxiety   . Chronic kidney disease   . Chronic renal disease, stage 4, severely decreased glomerular filtration rate (GFR) between 15-29 mL/min/1.73 square meter (HCC) 08/22/2015  . Complication of anesthesia    delirious after Breast Surgery  . Dementia ([place])    mild  . Depression   . Diabetes mellitus without complication ([place])    type II  . Dyspnea    with activity  . GERD (gastroesophageal reflux disease)   . Glaucoma   . Hypertension   . Pneumonia   . Stage 1 infiltrating ductal carcinoma of right female breast ([place]) 08/21/2015   ER+ PR+ HER 2 neu + (3+) T1cN0    Past Surgical History:  Procedure Laterality Date  . A/V FISTULAGRAM Right 07/05/2020   Procedure: A/V Fistulagram;  Surgeon: Elam Dutch, MD;  Location: [place] [facility];  Service: Cardiovascular;  Laterality: Right;  . AV FISTULA PLACEMENT Left 11/22/2017   Procedure: ARTERIOVENOUS (AV) FISTULA CREATION LEFT ARM;  Surgeon: Elam Dutch, MD;  [facility];  Service: Vascular;  Laterality: Left;  . AV FISTULA PLACEMENT Right 04/04/2020   Procedure: RIGHT ARM ARTERIOVENOUS FISTULA CREATION;  Surgeon: Rosetta Posner, MD;  Location: [facility];  Service: Vascular;  Laterality: Right;  . BIOPSY  08/07/2016   Procedure: BIOPSY;  Surgeon: Daneil Dolin, MD;  [facility];  Service: Endoscopy;;  gastric ulcer biopsy  . COLONOSCOPY    . ESOPHAGOGASTRODUODENOSCOPY N/A 08/07/2016   Procedure: ESOPHAGOGASTRODUODENOSCOPY (EGD);  Surgeon: Daneil Dolin, MD;  [facility];  Service: Endoscopy;  Laterality: N/A;  1215-rescheduled to 6/1 @ 2:30pm per Tretha Sciara  . ESOPHAGOGASTRODUODENOSCOPY N/A 11/27/2016   Procedure: ESOPHAGOGASTRODUODENOSCOPY (EGD);  Surgeon: Daneil Dolin, MD;  [facility];  Service: Endoscopy;  Laterality: N/A;  8:15am  . FISTULA SUPERFICIALIZATION Left 02/14/2018   Procedure: FISTULA SUPERFICIALIZATION LEFT ARM;  Surgeon: Angelia Mould, MD;  Location: [place];  Service: Vascular;  Laterality: Left;  . FRACTURE SURGERY Right    ankle  . MALONEY DILATION N/A 08/07/2016   Procedure: Venia Minks DILATION;  Surgeon: Daneil Dolin, MD;  [facility];  Service: Endoscopy;  Laterality: N/A;  . MASTECTOMY, PARTIAL Right   . PERIPHERAL VASCULAR BALLOON ANGIOPLASTY Left 07/13/2019   Procedure: PERIPHERAL VASCULAR BALLOON ANGIOPLASTY;  Surgeon: Marty Heck, MD;  Location: [place] [facility];  Service: Cardiovascular;  Laterality: Left;  arm fistulogram  . PERIPHERAL VASCULAR BALLOON ANGIOPLASTY Right 05/22/2020   Procedure: PERIPHERAL VASCULAR BALLOON ANGIOPLASTY;  Surgeon: Cherre Robins, MD;  Location: [place] [facility];  Service: Cardiovascular;  Laterality: Right;  arm fistula  . PERIPHERAL VASCULAR BALLOON ANGIOPLASTY Right 07/05/2020   Procedure: PERIPHERAL VASCULAR BALLOON ANGIOPLASTY;  Surgeon: Elam Dutch, MD;  Location: [place] [facility];  Service: Cardiovascular;  Laterality: Right;  arm fistula  . PORT-A-CATH REMOVAL Left 11/22/2017   Procedure: REMOVAL PORT-A-CATH LEFT CHEST;  Surgeon: Elam Dutch, MD;  Location: [facility] OR;  Service: Vascular;  Laterality: Left;  . RETINAL DETACHMENT SURGERY Right    Social History   Socioeconomic History  . Marital status: Single    Spouse name: Not on file  . Number of children: Not on file  . Years of education: Not on file  . Highest education level: Not on file  Occupational History  . Occupation: retired   Tobacco Use  . Smoking status: Never Smoker  . Smokeless tobacco: Never Used  Vaping Use  . Vaping Use: Never used  Substance and Sexual Activity  . Alcohol use: No    Alcohol/week: 0.0 standard drinks  . Drug use: No  . Sexual activity: Never  Other Topics Concern  . Not on file  Social History Narrative   Long term resident of SNF    Social Determinants of Health   Financial Resource Strain: Low Risk   . Difficulty of Paying  Living Expenses: Not very hard  Food Insecurity: No Food Insecurity  . Worried About Charity fundraiser in the Last Year: Never true  . Ran Out of Food in the Last Year: Never true  Transportation Needs: No Transportation Needs  . Lack of Transportation (Medical): No  . Lack of Transportation (Non-Medical): No  Physical Activity: Inactive  . Days of Exercise per Week: 0 days  . Minutes of Exercise per Session: 0 min  Stress: No Stress Concern Present  . Feeling of Stress : Not at all  Social Connections: Moderately Isolated  . Frequency of Communication with Friends and Family: More than three times a week  . Frequency of Social Gatherings with Friends and Family: Once a week  . Attends Religious Services: More than 4 times per year  . Active Member of Clubs or Organizations: No  . Attends Archivist Meetings: Never  . Marital Status: Never married   Family History  Problem Relation Age of Onset  . Multiple myeloma Sister   . Brain cancer Sister   . Dementia Mother  died at 35  . Stroke Mother   . Heart failure Mother   . Diabetes Mother   . Heart disease Father   . Prostate cancer Brother   . Colon cancer Neg Hx    Allergies  Allergen Reactions  . Ace Inhibitors Other (See Comments) and Cough    Tongue swell  . Penicillins Other (See Comments)    Unsure of reaction Has patient had a PCN reaction causing immediate rash, facial/tongue/throat swelling, SOB or lightheadedness with hypotension: Unknown Has patient had a PCN reaction causing severe rash involving mucus membranes or skin necrosis: Unknown Has patient had a PCN reaction that required hospitalization: No Has patient had a PCN reaction occurring within the last 10 years: Unknown If all of the above answers are "NO", then may proceed with Cephalosporin use.     Prior to Admission medications   Medication Sig Start Date End Date Taking? Authorizing Provider  acetaminophen (TYLENOL) 650 MG CR  tablet Take 650 mg by mouth every 6 (six) hours. 01/09/20   [provider]  acyclovir (ZOVIRAX) 200 MG capsule Take 200 mg by mouth daily. 10/20/19   [provider]  amLODipine (NORVASC) 10 MG tablet Take 10 mg by mouth daily. Not taking on dialysis days 01/25/18   [provider]  anastrozole (ARIMIDEX) 1 MG tablet TAKE 1 TABLET BY MOUTH DAILY Patient taking differently: Take 1 mg by mouth daily. 10/30/19   Derek Jack, MD  aspirin EC 81 MG tablet Take 81 mg by mouth daily.    [provider]  atorvastatin (LIPITOR) 10 MG tablet Take 10 mg by mouth at bedtime.    [provider]  calcium acetate (PHOSLO) 667 MG capsule Take 667-1,334 mg by mouth See admin instructions. Take 1,334 mg twice daily with meals and 667 mg once daily with snacks    [provider]  calcium-vitamin D (OSCAL WITH D) 500-200 MG-UNIT tablet Take 1 tablet by mouth daily. At 8 am    [provider]  cloNIDine (CATAPRES) 0.3 MG tablet Take 0.3 mg by mouth 2 (two) times daily.     [provider]  insulin aspart (NOVOLOG FLEXPEN) 100 UNIT/ML FlexPen Inject 10 Units into the skin 2 (two) times daily. 8 am and 5 pm, Hold for accu-check less than 150. 01/15/20   [provider]  insulin aspart (NOVOLOG FLEXPEN) 100 UNIT/ML FlexPen Inject 10 Units into the skin 2 (two) times daily. Hold for accu-check less than 150    [provider]  insulin glargine (LANTUS SOLOSTAR) 100 UNIT/ML Solostar Pen Inject 10 Units into the skin at bedtime. 01/23/20   [provider]  melatonin 3 MG TABS tablet Take 6 mg by mouth at bedtime. For Sleep 10/17/19   [provider]  Multiple Vitamin (MULTIVITAMIN) capsule Take 1 capsule by mouth daily.    [provider]  NON FORMULARY Diet: _____ Regular, ___x___ NAS, _______Consistent Carbohydrate, _______NPO __x___Other Low Potassium 12/22/19   [provider]  Nutritional  Supplements (NEPRO PO) Take 1 Can by mouth at bedtime. 11/11/19   [provider]   DG Chest 1 View  Result Date: 07/11/2020 CLINICAL DATA:  Pain following fall EXAM: CHEST  1 VIEW COMPARISON:  November 22, 2017 FINDINGS: Central catheter tip is at the cavoatrial junction. No pneumothorax. There is slight left base atelectasis. Lungs elsewhere clear. Heart is upper normal in size with pulmonary vascularity normal. There is aortic atherosclerosis. No adenopathy. No bone lesions. There are  surgical clips in the right axilla. IMPRESSION: Central catheter tip at cavoatrial junction. No pneumothorax. Mild left base atelectasis. No edema or airspace opacity. Stable cardiac silhouette. Aortic Atherosclerosis (ICD10-I70.0). Electronically Signed   By: Lowella Grip III M.D.   On: 07/11/2020 15:14   DG Knee 1-2 Views Right  Result Date: 07/11/2020 CLINICAL DATA:  Recent fall with right knee pain, initial encounter EXAM: RIGHT KNEE - 2 VIEW COMPARISON:  None. FINDINGS: No acute fracture or dislocation is noted. Diffuse vascular calcifications are seen. No soft tissue abnormality is noted. IMPRESSION: No acute abnormality noted. Electronically Signed   By: Inez Catalina M.D.   On: 07/11/2020 15:13   DG Hip Unilat  With Pelvis 2-3 Views Right  Result Date: 07/11/2020 CLINICAL DATA:  Fall, right hip deformity EXAM: DG HIP (WITH OR WITHOUT PELVIS) 2-3V RIGHT COMPARISON:  None. FINDINGS: Single view radiograph pelvis and two view radiograph of the right hip demonstrates an acute, mildly impacted, angulated basicervical right femoral neck fracture with mild posterior and moderate varus angulation. The femoral head is still seated within the right acetabulum. Mild-to-moderate degenerative hip arthritis is present with joint space narrowing noted bilaterally. The pelvis and left hip are intact. Multiple involuted fibroids are seen within the pelvis. Advanced vascular calcifications are seen within the pelvis and  medial thighs. IMPRESSION: Acute, impacted, angulated right basicervical femoral neck fracture. Electronically Signed   By: Fidela Salisbury MD   On: 07/11/2020 15:14   Family History Reviewed and non-contributory, no pertinent history of problems with bleeding or anesthesia    Review of Systems No fevers or chills No numbness or tingling No chest pain No shortness of breath No bowel or bladder dysfunction No GI distress No headaches    OBJECTIVE  Vitals: Patient Vitals for the past 8 hrs:  BP Temp Temp src Pulse Resp SpO2  07/12/20 0743 (!) 148/72 98.4 F (36.9 C) Oral 93 (!) 22 100 %  07/12/20 0512 (!) 153/61 97.7 F (36.5 C) -- 97 19 99 %  07/12/20 0135 (!) 148/60 98.1 F (36.7 C) Oral 81 18 98 %   General: Alert, no acute distress Cardiovascular: Extremities are warm Respiratory: No cyanosis, no use of accessory musculature Skin: No lesions in the area of chief complaint  Neurologic: Sensation intact distally; she has normal sensation to the right foot, in all nerve distributions.  Psychiatric: Patient is competent for consent with normal mood and affect Lymphatic: No swelling obvious and reported other than the area involved in the exam below Extremities  RLE: Extremity held in a fixed position.  ROM deferred due to known fracture.  Sensation is intact distally in the sural, saphenous, DP, SP, and plantar nerve distribution. 2+ DP pulse.  Toes are WWP.  Active motion intact in the TA/EHL/GS. LLE: Sensation is intact distally in the sural, saphenous, DP, SP, and plantar nerve distribution. 2+ DP pulse.  Toes are WWP.  Active motion intact in the TA/EHL/GS. Tolerates gentle ROM of the hip.  No pain with axial loading.     Test Results Imaging XR of the Right hip demonstrates a basicervical Intertrochanteric femur fracture.  Labs cbc Recent Labs    07/11/20 1642  WBC 9.1  HGB 10.6*  HCT 34.6*  PLT 248    Labs inflam No results for input(s): CRP in the last 72  hours.  Invalid input(s): ESR  Labs coag No results for input(s): INR, PTT in the last 72 hours.  Invalid input(s): PT  Recent  Labs    07/11/20 1602  NA 134*  K 4.9  CL 93*  CO2 30  GLUCOSE 79  BUN 45*  CREATININE 7.71*  CALCIUM 8.1*

## 2020-07-11 NOTE — ED Notes (Signed)
Put pt on monitor and a purewick.

## 2020-07-11 NOTE — ED Triage Notes (Signed)
Per EMS, pt was walking at [place] and lost balance. Pt denies hitting head or loc. Pt alert and oriented. RLE rotation and shortening noted.

## 2020-07-11 NOTE — ED Provider Notes (Signed)
[facility] EMERGENCY DEPARTMENT Provider Note   CSN: [id number] Arrival date & time: 07/11/20  1356     History Chief Complaint  Patient presents with  . Fall    Carly Wood is a 85 y.o. female.  HPI   Patient with significant medical history of end-stage renal disease currently on hemodialysis Monday Wednesday and Fridays, diabetes type 2, GERD, hypertension presents to the emergency department after having a mechanical fall.  Patient states she was at her facility when she heard there were games plays and she went to go find out what games were being played and fell onto her right side, she landed on her right knee and hip, she denies hitting her head, losing conscious, is not on anticoagulant.  Patient states she had severe pain in her knee and was unable to get up, she denies any sort of hip pain at this time, she denies neck or back pain, denies chest pain or shortness of breath.  Patient denies any alleviating factors.  Patient denies headaches, fevers, chills, shortness of breath, chest pain, abdominal pain, nausea, vomiting, diarrhea, worsening pedal edema.  Past Medical History:  Diagnosis Date  . Anemia   . Anxiety   . Chronic kidney disease   . Chronic renal disease, stage 4, severely decreased glomerular filtration rate (GFR) between 15-29 mL/min/1.73 square meter (HCC) 08/22/2015  . Complication of anesthesia    delirious after Breast Surgery  . Dementia ([place])    mild  . Depression   . Diabetes mellitus without complication ([place])    type II  . Dyspnea    with activity  . GERD (gastroesophageal reflux disease)   . Glaucoma   . Hypertension   . Pneumonia   . Stage 1 infiltrating ductal carcinoma of right female breast ([place]) 08/21/2015   ER+ PR+ HER 2 neu + (3+) T1cN0     Patient Active Problem List   Diagnosis Date Noted  . Closed right hip fracture ([place]) 07/11/2020  . Hyperlipidemia associated with type 2 diabetes mellitus ([place]) 06/19/2020  . Aortic  atherosclerosis ([place]) 04/01/2020  . Atherosclerotic peripheral vascular disease ([place]) 04/01/2020  . Acute pain of left knee 01/12/2020  . Hyperkalemia 12/25/2019  . Dependence on renal dialysis ([place]) 10/25/2019  . Chronic kidney disease with end stage renal disease on dialysis due to type 2 diabetes mellitus ([place]) 10/19/2019  . Hypertension due to end stage renal disease caused by type 2 diabetes mellitus, on dialysis ([place]) 10/19/2019  . Anemia due to end stage renal disease ([place]) 10/19/2019  . Herpes 10/19/2019  . Vascular dementia with behavior disturbance ([place]) 10/19/2019  . Diabetes mellitus with ESRD (end-stage renal disease) ([place]) 10/12/2019  . Multiple myeloma ([place]) 10/04/2018  . Medicare annual wellness visit, subsequent 10/04/2018  . MGUS (monoclonal gammopathy of unknown significance) 09/20/2018  . Iron deficiency anemia 05/03/2017  . Multiple gastric ulcers 11/10/2016  . Dysphagia 07/31/2016  . GERD (gastroesophageal reflux disease) 07/31/2016  . Abnormal weight loss 07/31/2016  . Aromatase inhibitor use 08/22/2015  . Lymphedema of arm 08/22/2015  . Status post right mastectomy 08/22/2015  . Osteopenia determined by x-ray 08/22/2015  . Stage 1 infiltrating ductal carcinoma of right female breast ([place]) 08/21/2015    Past Surgical History:  Procedure Laterality Date  . A/V FISTULAGRAM Right 07/05/2020   Procedure: A/V Fistulagram;  Surgeon: Elam Dutch, MD;  Location: [place] [facility];  Service: Cardiovascular;  Laterality: Right;  . AV FISTULA PLACEMENT Left 11/22/2017  Procedure: ARTERIOVENOUS (AV) FISTULA CREATION LEFT ARM;  Surgeon: Elam Dutch, MD;  Location: [facility] OR;  Service: Vascular;  Laterality: Left;  . AV FISTULA PLACEMENT Right 04/04/2020   Procedure: RIGHT ARM ARTERIOVENOUS FISTULA CREATION;  Surgeon: Rosetta Posner, MD;  Location: [facility] ORS;  Service: Vascular;  Laterality: Right;  . BIOPSY  08/07/2016   Procedure: BIOPSY;  Surgeon: Daneil Dolin, MD;   Location: [facility] ENDO SUITE;  Service: Endoscopy;;  gastric ulcer biopsy  . COLONOSCOPY    . ESOPHAGOGASTRODUODENOSCOPY N/A 08/07/2016   Procedure: ESOPHAGOGASTRODUODENOSCOPY (EGD);  Surgeon: Daneil Dolin, MD;  Location: [facility] ENDO SU[facility];  Service: Endoscopy;  Laterality: N/A;  1215-rescheduled to 6/1 @ 2:30pm per Tretha Sciara  . ESOPHAGOGASTRODUODENOSCOPY N/A 11/27/2016   Procedure: ESOPHAGOGASTRODUODENOSCOPY (EGD);  Surgeon: Daneil Dolin, MD;  [facility];  Service: Endoscopy;  Laterality: N/A;  8:15am  . FISTULA SUPERFICIALIZATION Left 02/14/2018   Procedure: FISTULA SUPERFICIALIZATION LEFT ARM;  Surgeon: Angelia Mould, MD;  Location: [place];  Service: Vascular;  Laterality: Left;  . FRACTURE SURGERY Right    ankle  . MALONEY DILATION N/A 08/07/2016   Procedure: Venia Minks DILATION;  Surgeon: Daneil Dolin, MD;  Location: [facility];  Service: Endoscopy;  Laterality: N/A;  . MASTECTOMY, PARTIAL Right   . PERIPHERAL VASCULAR BALLOON ANGIOPLASTY Left 07/13/2019   Procedure: PERIPHERAL VASCULAR BALLOON ANGIOPLASTY;  Surgeon: Marty Heck, MD;  Location: [place] [facility];  Service: Cardiovascular;  Laterality: Left;  arm fistulogram  . PERIPHERAL VASCULAR BALLOON ANGIOPLASTY Right 05/22/2020   Procedure: PERIPHERAL VASCULAR BALLOON ANGIOPLASTY;  Surgeon: Cherre Robins, MD;  Location: [place] [facility];  Service: Cardiovascular;  Laterality: Right;  arm fistula  . PERIPHERAL VASCULAR BALLOON ANGIOPLASTY Right 07/05/2020   Procedure: PERIPHERAL VASCULAR BALLOON ANGIOPLASTY;  Surgeon: Elam Dutch, MD;  Location: [place] [facility];  Service: Cardiovascular;  Laterality: Right;  arm fistula  . PORT-A-CATH REMOVAL Left 11/22/2017   Procedure: REMOVAL PORT-A-CATH LEFT CHEST;  Surgeon: Elam Dutch, MD;  Location: [facility] OR;  Service: Vascular;  Laterality: Left;  . RETINAL DETACHMENT SURGERY Right      OB History   No obstetric history on file.     Family History   Problem Relation Age of Onset  . Multiple myeloma Sister   . Brain cancer Sister   . Dementia Mother        died at 72  . Stroke Mother   . Heart failure Mother   . Diabetes Mother   . Heart disease Father   . Prostate cancer Brother   . Colon cancer Neg Hx     Social History   Tobacco Use  . Smoking status: Never Smoker  . Smokeless tobacco: Never Used  Vaping Use  . Vaping Use: Never used  Substance Use Topics  . Alcohol use: No    Alcohol/week: 0.0 standard drinks  . Drug use: No    Home Medications Prior to Admission medications   Medication Sig Start Date End Date Taking? Authorizing Provider  acetaminophen (TYLENOL) 650 MG CR tablet Take 650 mg by mouth every 6 (six) hours. 01/09/20   [provider]  acyclovir (ZOVIRAX) 200 MG capsule Take 200 mg by mouth daily. 10/20/19   [provider]  amLODipine (NORVASC) 10 MG tablet Take 10 mg by mouth daily. Not taking on dialysis days 01/25/18   [provider]  anastrozole (ARIMIDEX) 1 MG tablet TAKE 1 TABLET BY MOUTH DAILY  Patient taking differently: Take 1 mg by mouth daily. 10/30/19   Derek Jack, MD  aspirin EC 81 MG tablet Take 81 mg by mouth daily.    [provider]  atorvastatin (LIPITOR) 10 MG tablet Take 10 mg by mouth at bedtime.    [provider]  calcium acetate (PHOSLO) 667 MG capsule Take 667-1,334 mg by mouth See admin instructions. Take 1,334 mg twice daily with meals and 667 mg once daily with snacks    [provider]  calcium-vitamin D (OSCAL WITH D) 500-200 MG-UNIT tablet Take 1 tablet by mouth daily. At 8 am    [provider]  cloNIDine (CATAPRES) 0.3 MG tablet Take 0.3 mg by mouth 2 (two) times daily.     [provider]  insulin aspart (NOVOLOG FLEXPEN) 100 UNIT/ML FlexPen Inject 10 Units into the skin 2 (two) times daily. 8 am and 5 pm, Hold for accu-check less than 150. 01/15/20   [provider]  insulin aspart  (NOVOLOG FLEXPEN) 100 UNIT/ML FlexPen Inject 10 Units into the skin 2 (two) times daily. Hold for accu-check less than 150    [provider]  insulin glargine (LANTUS SOLOSTAR) 100 UNIT/ML Solostar Pen Inject 10 Units into the skin at bedtime. 01/23/20   [provider]  melatonin 3 MG TABS tablet Take 6 mg by mouth at bedtime. For Sleep 10/17/19   [provider]  Multiple Vitamin (MULTIVITAMIN) capsule Take 1 capsule by mouth daily.    [provider]  NON FORMULARY Diet: _____ Regular, ___x___ NAS, _______Consistent Carbohydrate, _______NPO __x___Other Low Potassium 12/22/19   [provider]  Nutritional Supplements (NEPRO PO) Take 1 Can by mouth at bedtime. 11/11/19   [provider]    Allergies    Ace inhibitors and Penicillins  Review of Systems   Review of Systems  Constitutional: Negative for chills and fever.  HENT: Negative for congestion and sore throat.   Respiratory: Negative for shortness of breath.   Cardiovascular: Negative for chest pain.  Gastrointestinal: Negative for abdominal pain.  Genitourinary: Negative for enuresis.  Musculoskeletal: Negative for back pain and neck pain.       Knee pain  Skin: Negative for rash.  Neurological: Negative for headaches.  Hematological: Does not bruise/bleed easily.    Physical Exam Updated Vital Signs BP (!) 121/54   Pulse 77   Resp (!) 0   Ht $R'5\' 3"'CS$  (1.6 m)   Wt 67.1 kg   SpO2 98%   BMI 26.22 kg/m   Physical Exam Vitals and nursing note reviewed.  Constitutional:      General: She is not in acute distress.    Appearance: She is not ill-appearing.  HENT:     Head: Normocephalic and atraumatic.     Nose: No congestion.  Eyes:     Conjunctiva/sclera: Conjunctivae normal.  Cardiovascular:     Rate and Rhythm: Normal rate and regular rhythm.     Pulses: Normal pulses.     Heart sounds: No murmur heard. No friction rub. No gallop.   Pulmonary:     Effort: No  respiratory distress.     Breath sounds: No wheezing, rhonchi or rales.  Abdominal:     Palpations: Abdomen is soft.     Tenderness: There is no abdominal tenderness.  Musculoskeletal:     Right lower leg: No edema.     Left lower leg: No edema.     Comments: Patient has noted external rotation of the  right leg, no leg shortening present, she has full range of motion at her toes, ankle, has difficulty bending at the knee due to pain, unable to flex at the hip, hip was nontender to palpation, no deformities present, she was slightly tender to palpation along the lateral aspect of the tibial plateau no deformities noted.  Neurovascularly intact.  Skin:    General: Skin is warm and dry.  Neurological:     Mental Status: She is alert.  Psychiatric:        Mood and Affect: Mood normal.     ED Results / Procedures / Treatments   Labs (all labs ordered are listed, but only abnormal results are displayed) Labs Reviewed  COMPREHENSIVE METABOLIC PANEL - Abnormal; Notable for the following components:      Result Value   Sodium 134 (*)    Chloride 93 (*)    BUN 45 (*)    Creatinine, Ser 7.71 (*)    Calcium 8.1 (*)    GFR, Estimated 5 (*)    All other components within normal limits  CBC WITH DIFFERENTIAL/PLATELET - Abnormal; Notable for the following components:   RBC 3.17 (*)    Hemoglobin 10.6 (*)    HCT 34.6 (*)    MCV 109.1 (*)    RDW 15.9 (*)    Monocytes Absolute 1.1 (*)    All other components within normal limits  RESP PANEL BY RT-PCR (FLU A&B, COVID) ARPGX2  CBC WITH DIFFERENTIAL/PLATELET  MAGNESIUM    EKG None  Radiology DG Chest 1 View  Result Date: 07/11/2020 CLINICAL DATA:  Pain following fall EXAM: CHEST  1 VIEW COMPARISON:  November 22, 2017 FINDINGS: Central catheter tip is at the cavoatrial junction. No pneumothorax. There is slight left base atelectasis. Lungs elsewhere clear. Heart is upper normal in size with pulmonary vascularity normal. There is aortic  atherosclerosis. No adenopathy. No bone lesions. There are surgical clips in the right axilla. IMPRESSION: Central catheter tip at cavoatrial junction. No pneumothorax. Mild left base atelectasis. No edema or airspace opacity. Stable cardiac silhouette. Aortic Atherosclerosis (ICD10-I70.0). Electronically Signed   By: Lowella Grip III M.D.   On: 07/11/2020 15:14   DG Knee 1-2 Views Right  Result Date: 07/11/2020 CLINICAL DATA:  Recent fall with right knee pain, initial encounter EXAM: RIGHT KNEE - 2 VIEW COMPARISON:  None. FINDINGS: No acute fracture or dislocation is noted. Diffuse vascular calcifications are seen. No soft tissue abnormality is noted. IMPRESSION: No acute abnormality noted. Electronically Signed   By: Inez Catalina M.D.   On: 07/11/2020 15:13   DG Hip Unilat  With Pelvis 2-3 Views Right  Result Date: 07/11/2020 CLINICAL DATA:  Fall, right hip deformity EXAM: DG HIP (WITH OR WITHOUT PELVIS) 2-3V RIGHT COMPARISON:  None. FINDINGS: Single view radiograph pelvis and two view radiograph of the right hip demonstrates an acute, mildly impacted, angulated basicervical right femoral neck fracture with mild posterior and moderate varus angulation. The femoral head is still seated within the right acetabulum. Mild-to-moderate degenerative hip arthritis is present with joint space narrowing noted bilaterally. The pelvis and left hip are intact. Multiple involuted fibroids are seen within the pelvis. Advanced vascular calcifications are seen within the pelvis and medial thighs. IMPRESSION: Acute, impacted, angulated right basicervical femoral neck fracture. Electronically Signed   By: Fidela Salisbury MD   On: 07/11/2020 15:14    Procedures Procedures   Medications Ordered in ED Medications  morphine 2 MG/ML injection 2 mg (has no  administration in time range)  morphine 2 MG/ML injection 2 mg (2 mg Intravenous Given 07/11/20 1607)    ED Course  I have reviewed the triage vital signs and the  nursing notes.  Pertinent labs & imaging results that were available during my care of the patient were reviewed by me and considered in my medical decision making (see chart for details).    MDM Rules/Calculators/A&P                         Initial impression-patient presents after a mechanical fall.  She is alert, does not appear in acute distress, vital signs reassuring.  Concern for right hip fracture and knee fracture.  Will obtain imaging for further evaluation.  Work-up-chest x-ray reveals central catheter tip at the cavoatrial junction, left base atelectasis, no edema or airspace patient noted.  Right hip x-ray reveals acute impacted angulated right by cervical femoral neck fracture, the x-ray negative for acute findings.  Reassessment patient had pain in her right knee we will provide her with morphine and reassess.  Patient was reassessed after pain medication, states she feels better, updated her on imaging and that she will have to be admitted for possible surgery tomorrow.  Patient is agreement this.  Will consult with orthopedics for further evaluation.  Consult spoke with doctor Carins of orthopedic surgery will likely take the patient to the OR tomorrow, would like her n.p.o. by midnight tonight, and admit to medicine  Rule out- Low suspicion for intracranial head bleed or cranial fracture as patient denies hitting her head, losing conscious, no palpable defect on my exam, no neurodeficits.  Low suspicion for rib fracture or pneumothorax as lung sounds are clear bilaterally, chest is negative for acute findings.  Low suspicion for compartment syndrome of the right leg as compartments are soft, neurovascularly intact.  Low suspicion for emergent hemodialysis as there is no signs of fluid overload no on my exam, chest x-ray unremarkable, no signs of electro derailment.  Plan-admit to medicine due to right hip fracture and will go to the OR tomorrow.  Patient will be turned over to  admitting team.   Final Clinical Impression(s) / ED Diagnoses Final diagnoses:  Closed fracture of right hip, initial encounter [facility])    Rx / DC Orders ED Discharge Orders    None       Marcello Fennel, PA-C 07/11/20 1750    Milton Ferguson, MD 07/11/20 1758

## 2020-07-12 ENCOUNTER — Encounter (HOSPITAL_COMMUNITY): Payer: Self-pay | Admitting: Internal Medicine

## 2020-07-12 ENCOUNTER — Inpatient Hospital Stay (HOSPITAL_COMMUNITY): Payer: Medicare PPO | Admitting: Anesthesiology

## 2020-07-12 ENCOUNTER — Other Ambulatory Visit: Payer: Self-pay | Admitting: *Deleted

## 2020-07-12 ENCOUNTER — Encounter (HOSPITAL_COMMUNITY)
Admission: EM | Disposition: A | Discharge status: Skilled Nursing Facility | Payer: Self-pay | Source: Skilled Nursing Facility | Attending: Family Medicine

## 2020-07-12 ENCOUNTER — Inpatient Hospital Stay (HOSPITAL_COMMUNITY): Payer: Medicare PPO

## 2020-07-12 DIAGNOSIS — C50911 Malignant neoplasm of unspecified site of right female breast: Secondary | ICD-10-CM

## 2020-07-12 DIAGNOSIS — N186 End stage renal disease: Secondary | ICD-10-CM

## 2020-07-12 DIAGNOSIS — C9000 Multiple myeloma not having achieved remission: Secondary | ICD-10-CM

## 2020-07-12 DIAGNOSIS — N184 Chronic kidney disease, stage 4 (severe): Secondary | ICD-10-CM

## 2020-07-12 DIAGNOSIS — E1122 Type 2 diabetes mellitus with diabetic chronic kidney disease: Secondary | ICD-10-CM

## 2020-07-12 DIAGNOSIS — I70209 Unspecified atherosclerosis of native arteries of extremities, unspecified extremity: Secondary | ICD-10-CM

## 2020-07-12 HISTORY — PX: INTRAMEDULLARY (IM) NAIL INTERTROCHANTERIC: SHX5875

## 2020-07-12 LAB — BASIC METABOLIC PANEL
Anion gap: 15 (ref 5–15)
BUN: 53 mg/dL — ABNORMAL HIGH (ref 8–23)
CO2: 27 mmol/L (ref 22–32)
Calcium: 8.5 mg/dL — ABNORMAL LOW (ref 8.9–10.3)
Chloride: 93 mmol/L — ABNORMAL LOW (ref 98–111)
Creatinine, Ser: 9.68 mg/dL — ABNORMAL HIGH (ref 0.44–1.00)
GFR, Estimated: 4 mL/min — ABNORMAL LOW (ref >60)
Glucose, Bld: 147 mg/dL — ABNORMAL HIGH (ref 70–99)
Potassium: 4.9 mmol/L (ref 3.5–5.1)
Sodium: 135 mmol/L (ref 135–145)

## 2020-07-12 LAB — GLUCOSE, CAPILLARY
Glucose-Capillary: 153 mg/dL — ABNORMAL HIGH (ref 70–99)
Glucose-Capillary: 131 mg/dL — ABNORMAL HIGH (ref 70–99)
Glucose-Capillary: 167 mg/dL — ABNORMAL HIGH (ref 70–99)
Glucose-Capillary: 160 mg/dL — ABNORMAL HIGH (ref 70–99)
Glucose-Capillary: 164 mg/dL — ABNORMAL HIGH (ref 70–99)

## 2020-07-12 LAB — CBC
HCT: 34.8 % — ABNORMAL LOW (ref 36.0–46.0)
Hemoglobin: 10.8 g/dL — ABNORMAL LOW (ref 12.0–15.0)
MCH: 33.4 pg (ref 26.0–34.0)
MCHC: 31 g/dL (ref 30.0–36.0)
MCV: 107.7 fL — ABNORMAL HIGH (ref 80.0–100.0)
Platelets: 297 10*3/uL (ref 150–400)
RBC: 3.23 MIL/uL — ABNORMAL LOW (ref 3.87–5.11)
RDW: 15.9 % — ABNORMAL HIGH (ref 11.5–15.5)
WBC: 10.6 10*3/uL — ABNORMAL HIGH (ref 4.0–10.5)
nRBC: 0 % (ref 0.0–0.2)

## 2020-07-12 SURGERY — FIXATION, FRACTURE, INTERTROCHANTERIC, WITH INTRAMEDULLARY ROD
Anesthesia: General | Site: Hip | Laterality: Right

## 2020-07-12 MED ORDER — ONDANSETRON HCL 4 MG/2ML IJ SOLN
INTRAMUSCULAR | Status: DC | PRN
  Administered 2020-07-12: 4 mg via INTRAVENOUS

## 2020-07-12 MED ORDER — METOPROLOL TARTRATE 5 MG/5ML IV SOLN
INTRAVENOUS | Status: DC | PRN
  Administered 2020-07-12: 3 mg via INTRAVENOUS
  Administered 2020-07-12: 2 mg via INTRAVENOUS

## 2020-07-12 MED ORDER — PHENYLEPHRINE 40 MCG/ML (10ML) SYRINGE FOR IV PUSH (FOR BLOOD PRESSURE SUPPORT)
PREFILLED_SYRINGE | INTRAVENOUS | Status: DC | PRN
  Administered 2020-07-12 (×4): 40 ug via INTRAVENOUS

## 2020-07-12 MED ORDER — NEOSTIGMINE METHYLSULFATE 10 MG/10ML IV SOLN
INTRAVENOUS | Status: DC | PRN
  Administered 2020-07-12: 2 mg via INTRAVENOUS

## 2020-07-12 MED ORDER — DEXAMETHASONE SODIUM PHOSPHATE 10 MG/ML IJ SOLN
INTRAMUSCULAR | Status: DC | PRN
  Administered 2020-07-12: 5 mg via INTRAVENOUS

## 2020-07-12 MED ORDER — FENTANYL CITRATE (PF) 100 MCG/2ML IJ SOLN
INTRAMUSCULAR | Status: AC
  Filled 2020-07-12: qty 2

## 2020-07-12 MED ORDER — TRANEXAMIC ACID-NACL 1000-0.7 MG/100ML-% IV SOLN
INTRAVENOUS | Status: DC | PRN
  Administered 2020-07-12: 1000 mg via INTRAVENOUS

## 2020-07-12 MED ORDER — SODIUM CHLORIDE 0.9 % IV SOLN: Freq: Once | INTRAVENOUS | Status: AC

## 2020-07-12 MED ORDER — ASPIRIN EC 81 MG PO TBEC
81.0000 mg | DELAYED_RELEASE_TABLET | Freq: Two times a day (BID) | ORAL | Status: DC
  Administered 2020-07-12 – 2020-07-17 (×10): 81 mg via ORAL
  Filled 2020-07-12 (×10): qty 1

## 2020-07-12 MED ORDER — PROPOFOL 10 MG/ML IV BOLUS
INTRAVENOUS | Status: AC
  Filled 2020-07-12: qty 20

## 2020-07-12 MED ORDER — CHLORHEXIDINE GLUCONATE 0.12 % MT SOLN
15.0000 mL | Freq: Once | OROMUCOSAL | Status: AC
  Administered 2020-07-12: 15 mL via OROMUCOSAL

## 2020-07-12 MED ORDER — LABETALOL HCL 5 MG/ML IV SOLN: 10.0000 mg | INTRAVENOUS | Status: DC | PRN

## 2020-07-12 MED ORDER — BUPIVACAINE HCL (PF) 0.5 % IJ SOLN
INTRAMUSCULAR | Status: DC | PRN
  Administered 2020-07-12: 30 mL

## 2020-07-12 MED ORDER — FENTANYL CITRATE (PF) 100 MCG/2ML IJ SOLN
50.0000 ug | Freq: Once | INTRAMUSCULAR | Status: AC
  Administered 2020-07-12: 50 ug via INTRAVENOUS

## 2020-07-12 MED ORDER — LABETALOL HCL 5 MG/ML IV SOLN
10.0000 mg | Freq: Once | INTRAVENOUS | Status: AC
  Administered 2020-07-12: 10 mg via INTRAVENOUS

## 2020-07-12 MED ORDER — FENTANYL CITRATE (PF) 100 MCG/2ML IJ SOLN: INTRAMUSCULAR | Status: DC | PRN

## 2020-07-12 MED ORDER — SODIUM CHLORIDE 0.9 % IR SOLN
Status: DC | PRN
  Administered 2020-07-12: 1000 mL

## 2020-07-12 MED ORDER — FENTANYL CITRATE (PF) 100 MCG/2ML IJ SOLN
50.0000 ug | Freq: Once | INTRAMUSCULAR | Status: AC
  Administered 2020-07-12: 50 ug via INTRAVENOUS
  Filled 2020-07-12: qty 2

## 2020-07-12 MED ORDER — ORAL CARE MOUTH RINSE: 15.0000 mL | Freq: Once | OROMUCOSAL | Status: AC

## 2020-07-12 MED ORDER — FENTANYL CITRATE (PF) 100 MCG/2ML IJ SOLN
25.0000 ug | INTRAMUSCULAR | Status: DC | PRN
  Administered 2020-07-12: 25 ug via INTRAVENOUS
  Filled 2020-07-12: qty 2

## 2020-07-12 MED ORDER — GLYCOPYRROLATE PF 0.2 MG/ML IJ SOSY
PREFILLED_SYRINGE | INTRAMUSCULAR | Status: DC | PRN
  Administered 2020-07-12: .4 mg via INTRAVENOUS

## 2020-07-12 MED ORDER — PROPOFOL 10 MG/ML IV BOLUS
INTRAVENOUS | Status: DC | PRN
  Administered 2020-07-12: 80 mg via INTRAVENOUS

## 2020-07-12 MED ORDER — BUPIVACAINE HCL (PF) 0.5 % IJ SOLN
INTRAMUSCULAR | Status: AC
  Filled 2020-07-12: qty 30

## 2020-07-12 MED ORDER — FENTANYL CITRATE (PF) 100 MCG/2ML IJ SOLN
INTRAMUSCULAR | Status: DC | PRN
  Administered 2020-07-12: 50 ug via INTRAVENOUS
  Administered 2020-07-12: 25 ug via INTRAVENOUS
  Administered 2020-07-12: 50 ug via INTRAVENOUS

## 2020-07-12 MED ORDER — ESMOLOL HCL 100 MG/10ML IV SOLN
INTRAVENOUS | Status: AC
  Filled 2020-07-12: qty 10

## 2020-07-12 MED ORDER — ESMOLOL HCL 100 MG/10ML IV SOLN
INTRAVENOUS | Status: DC | PRN
  Administered 2020-07-12: 30 mg via INTRAVENOUS
  Administered 2020-07-12: 20 mg via INTRAVENOUS

## 2020-07-12 MED ORDER — LABETALOL HCL 5 MG/ML IV SOLN
INTRAVENOUS | Status: AC
  Filled 2020-07-12: qty 4

## 2020-07-12 MED ORDER — ROCURONIUM BROMIDE 100 MG/10ML IV SOLN
INTRAVENOUS | Status: DC | PRN
  Administered 2020-07-12: 30 mg via INTRAVENOUS

## 2020-07-12 MED ORDER — LACTATED RINGERS IV SOLN: INTRAVENOUS | Status: DC | PRN

## 2020-07-12 MED ORDER — SODIUM CHLORIDE 0.9 % IV SOLN: INTRAVENOUS | Status: DC | PRN

## 2020-07-12 MED ORDER — CHLORHEXIDINE GLUCONATE CLOTH 2 % EX PADS
6.0000 | MEDICATED_PAD | Freq: Every day | CUTANEOUS | Status: DC
  Administered 2020-07-13 – 2020-07-16 (×3): 6 via TOPICAL

## 2020-07-12 MED ORDER — METOPROLOL TARTRATE 5 MG/5ML IV SOLN
INTRAVENOUS | Status: AC
  Filled 2020-07-12: qty 5

## 2020-07-12 SURGICAL SUPPLY — 64 items
APL PRP STRL LF DISP 70% ISPRP (MISCELLANEOUS) ×1
BIT DRILL 4.0X280 (BIT) ×1 IMPLANT
BLADE SURG SZ10 CARB STEEL (BLADE) ×4 IMPLANT
BNDG GAUZE ELAST 4 BULKY (GAUZE/BANDAGES/DRESSINGS) ×2 IMPLANT
BRUSH SCRUB EZ W/ULTRADEX 3%PC (MISCELLANEOUS) ×1 IMPLANT
CHLORAPREP W/TINT 26 (MISCELLANEOUS) ×2 IMPLANT
CLOTH BEACON ORANGE TIMEOUT ST (SAFETY) ×2 IMPLANT
COVER LIGHT HANDLE STERIS (MISCELLANEOUS) ×4 IMPLANT
COVER MAYO STAND XLG (MISCELLANEOUS) ×2 IMPLANT
COVER PERINEAL POST (MISCELLANEOUS) ×2 IMPLANT
COVER WAND RF STERILE (DRAPES) ×2 IMPLANT
DECANTER SPIKE VIAL GLASS SM (MISCELLANEOUS) ×2 IMPLANT
DRAPE HALF SHEET 40X57 (DRAPES) ×1 IMPLANT
DRAPE INCISE IOBAN 44X35 STRL (DRAPES) IMPLANT
DRAPE STERI IOBAN 125X83 (DRAPES) ×2 IMPLANT
DRSG PAD ABDOMINAL 8X10 ST (GAUZE/BANDAGES/DRESSINGS) ×2 IMPLANT
DRSG TEGADERM 2-3/8X2-3/4 SM (GAUZE/BANDAGES/DRESSINGS) ×2 IMPLANT
DRSG TEGADERM 4X4.75 (GAUZE/BANDAGES/DRESSINGS) ×8 IMPLANT
ELECT REM PT RETURN 9FT ADLT (ELECTROSURGICAL) ×2
ELECTRODE REM PT RTRN 9FT ADLT (ELECTROSURGICAL) ×1 IMPLANT
GAUZE SPONGE 4X4 12PLY STRL (GAUZE/BANDAGES/DRESSINGS) ×2 IMPLANT
GAUZE XEROFORM 1X8 LF (GAUZE/BANDAGES/DRESSINGS) ×4 IMPLANT
GLOVE SKINSENSE NS SZ8.0 LF (GLOVE) ×2
GLOVE SKINSENSE STRL SZ8.0 LF (GLOVE) ×2 IMPLANT
GLOVE SRG 8 PF TXTR STRL LF DI (GLOVE) ×1 IMPLANT
GLOVE SURG UNDER POLY LF SZ7 (GLOVE) ×5 IMPLANT
GLOVE SURG UNDER POLY LF SZ8 (GLOVE) ×2
GOWN STRL REUS W/ TWL XL LVL3 (GOWN DISPOSABLE) ×1 IMPLANT
GOWN STRL REUS W/TWL LRG LVL3 (GOWN DISPOSABLE) ×3 IMPLANT
GOWN STRL REUS W/TWL XL LVL3 (GOWN DISPOSABLE) ×2
GUIDEWIRE BALL NOSE 3.0X900 (WIRE) ×2
GUIDEWIRE ORTH 900X3XBALL NOSE (WIRE) IMPLANT
INST SET MAJOR BONE (KITS) ×2 IMPLANT
K-WIRE  3.2X450M STR (WIRE)
K-WIRE 3.2X450M STR (WIRE)
KIT BLADEGUARD II DBL (SET/KITS/TRAYS/PACK) ×2 IMPLANT
KIT TURNOVER CYSTO (KITS) ×2 IMPLANT
KWIRE 3.2X450M STR (WIRE) IMPLANT
MANIFOLD NEPTUNE II (INSTRUMENTS) ×2 IMPLANT
MARKER SKIN DUAL TIP RULER LAB (MISCELLANEOUS) ×2 IMPLANT
NAIL 11X36X130D ANGLED ES (Nail) ×1 IMPLANT
NDL HYPO 21X1.5 SAFETY (NEEDLE) ×1 IMPLANT
NEEDLE HYPO 21X1.5 SAFETY (NEEDLE) ×2 IMPLANT
NS IRRIG 1000ML POUR BTL (IV SOLUTION) ×2 IMPLANT
PACK BASIC III (CUSTOM PROCEDURE TRAY) ×2
PACK SRG BSC III STRL LF ECLPS (CUSTOM PROCEDURE TRAY) ×1 IMPLANT
PAD ARMBOARD 7.5X6 YLW CONV (MISCELLANEOUS) ×3 IMPLANT
PENCIL SMOKE EVACUATOR COATED (MISCELLANEOUS) ×2 IMPLANT
PIN GUIDE THRD AR 3.2X330 (PIN) ×1 IMPLANT
SCREW LOCK CORT 5.0X38 (Screw) ×1 IMPLANT
SCREW LOCK LAG FEM 10.5X90 (Screw) ×1 IMPLANT
SET BASIN LINEN APH (SET/KITS/TRAYS/PACK) ×2 IMPLANT
SPONGE LAP 18X18 RF (DISPOSABLE) ×4 IMPLANT
STAPLER VISISTAT (STAPLE) ×1 IMPLANT
STRIP CLOSURE SKIN 1/2X4 (GAUZE/BANDAGES/DRESSINGS) ×1 IMPLANT
SUT MON AB 2-0 CT1 36 (SUTURE) ×3 IMPLANT
SUT VIC AB 0 CT1 27 (SUTURE) ×2
SUT VIC AB 0 CT1 27XBRD ANTBC (SUTURE) ×1 IMPLANT
SYR 30ML LL (SYRINGE) ×2 IMPLANT
SYR BULB IRRIG 60ML STRL (SYRINGE) ×4 IMPLANT
TOOL ACTIVATION (INSTRUMENTS) ×1 IMPLANT
TRAY FOLEY MTR SLVR 16FR STAT (SET/KITS/TRAYS/PACK) ×2 IMPLANT
WATER STERILE IRR 1000ML POUR (IV SOLUTION) ×2 IMPLANT
YANKAUER SUCT BULB TIP NO VENT (SUCTIONS) ×2 IMPLANT

## 2020-07-12 NOTE — Op Note (Addendum)
Orthopaedic Surgery Operative Note (CSN: [id number])  Carly Wood  [birth year] Date of Surgery: 07/12/2020   Diagnoses:  Right hip fracture; Basicervical, Intertrochanteric femur fracture    Procedure: Cephalomedullary nail for Right basicervical intertrochanteric femur fracture   Operative Finding Successful completion of the planned procedure.     Post-Op Diagnosis: Same Surgeons:Primary: Mordecai Rasmussen, MD Assistants: Marquita Palms Location: [facility] OR ROOM 3 Anesthesia: General with local anesthesia Antibiotics: Clindamycin Tourniquet time: N/A Estimated Blood Loss: 027 cc Complications: None Specimens: None Implants: Implant Name Type Inv. Item Serial No. Manufacturer Lot No. LRB No. Used Action  [employer] TORCH NAIL    [employer] STERILE ON SET Right 1 Implanted  SCREW LOCK LAG FEM 10.5X90 - [id number] Screw SCREW LOCK LAG FEM 10.5X90  [employer] STERILE ON SET Right 1 Implanted  SCREW LOCK CORT 5.0X38 - [id number] Screw SCREW LOCK CORT 5.0X38  [employer] STERILE ON SET Right 1 Implanted    Indications for Surgery:   Carly Wood is a 85 y.o. female who had a mechanical fall and sustained a Right basicervical, intertrochanteric femur fracture.  I recommended operative fixation to restore stability and allow the patient to ambulate immediately postop.  Benefits and risks of operative and nonoperative management were discussed prior to surgery with the patient and informed consent form was completed.  Specific risks including infection, need for additional surgery, persistent pain, bleeding, malunion, nonunion, damage to surrounding structures and more severe complications associated with anesthesia.  The patient elected to proceed and surgical consent was obtained.    Procedure:   The patient was identified properly. Informed consent was obtained and the surgical site was marked. The patient was taken to the OR where general anesthesia was induced.   The patient was placed  supine on a fracture table and appropriate reduction was obtained and visualized on fluoroscopy prior to the beginning of the procedure.  Timeout was performed before the beginning of the case.  Clindamycin dosing was confirmed prior to making incision.  The patient received TXA prior to the start of surgery.   We made an incision proximal to the greater trochanter and dissected down through the fascia.  We then carefully placed a guidepin, localizing under fluoroscopy.  Once satisfied with the starting point, the entry reamer was used to gain entry into the intramedullary canal.  A ball tip guidewire was then introduced and passed to an appropriate level at the physeal scar of the distal femur and measurement was obtained proximally using fluoroscopy.  We selected the appropriate length of nail, as noted above.  Entry reamer was used but the nail was unreamed.  At this point we placed our nail localizing under fluoroscopy, and confirmed that it was at the appropriate level.  Next we used the outrigger device to pass a wire into the femoral neck, and then the cephalomedullary lag screw.  The screw was deployed to allow some collapse at the fracture site.  We then turned our attention to the distal interlocking screw.  Once again, we used the outrigger device to place a single interlocking screw in the midshaft area.  The outrigger device was removed and final fluoroscopic images were obtained.  The wounds were thoroughly irrigated closed in a multilayer fashion with 0 vicryl, 2-0 monocryl and staples.  Sterile dressings were placed.  The patient was awoken from general anesthesia and taken to the PACU in stable condition without complication.     Post-operative plan:  Weightbearing: The patient will  be WBAT on the operative extremity.   DVT prophylaxis per primary team, no orthopedic contraindications.  Recommend 81 mg Aspirin BID, unless patient cannot tolerate or was previously on anticoagulation.   Prefer to start Ppx POD#1 Pain control with PRN pain medication preferring oral medicines.   Dressing can be reinforced as needed, will change on POD#2/3 if needed.  Patient does not need to remain hospitalized for dressing change Follow up plan: approximately 2 weeks postop for incision check and XR.  If the patient will be returning to a nursing facility, staples can be removed around this time and a follow up appointment can be scheduled for 6 weeks after surgery. XR at next visit:  please obtain [facility] pelvis, and 2 views of the Right hip and femur

## 2020-07-12 NOTE — Anesthesia Procedure Notes (Signed)
Procedure Name: Intubation Date/Time: 07/12/2020 12:24 PM Performed by: Hewitt Blade, CRNA Pre-anesthesia Checklist: Patient identified, Emergency Drugs available, Suction available and Patient being monitored Patient Re-evaluated:Patient Re-evaluated prior to induction Oxygen Delivery Method: Circle system utilized Preoxygenation: Pre-oxygenation with 100% oxygen Induction Type: IV induction Ventilation: Mask ventilation without difficulty Laryngoscope Size: Mac and 3 Grade View: Grade I Tube type: Oral Tube size: 7.0 mm Number of attempts: 1 Airway Equipment and Method: Stylet and Oral airway Placement Confirmation: ETT inserted through vocal cords under direct vision,  positive ETCO2 and breath sounds checked- equal and bilateral Secured at: 22 cm Tube secured with: Tape Dental Injury: Teeth and Oropharynx as per pre-operative assessment

## 2020-07-12 NOTE — Anesthesia Preprocedure Evaluation (Signed)
Anesthesia Evaluation  Patient identified by MRN, date of birth, ID band Patient awake    Reviewed: Allergy & Precautions, NPO status , Patient's Chart, lab work & pertinent test results  History of Anesthesia Complications (+) history of anesthetic complications  Airway Mallampati: II  TM Distance: >3 FB Neck ROM: Full    Dental  (+) Dental Advisory Given   Pulmonary shortness of breath, pneumonia,    Pulmonary exam normal breath sounds clear to auscultation       Cardiovascular Exercise Tolerance: Poor hypertension, Pt. on medications + Peripheral Vascular Disease  Normal cardiovascular exam Rhythm:Regular Rate:Normal     Neuro/Psych PSYCHIATRIC DISORDERS Anxiety Depression Dementia    GI/Hepatic PUD, GERD  Medicated and Controlled,  Endo/Other  diabetes, Well Controlled, Type 2, Oral Hypoglycemic Agents  Renal/GU ESRF and DialysisRenal disease     Musculoskeletal negative musculoskeletal ROS (+)   Abdominal   Peds  Hematology  (+) anemia ,   Anesthesia Other Findings   Reproductive/Obstetrics negative OB ROS                            Anesthesia Physical Anesthesia Plan  ASA: IV  Anesthesia Plan: General   Post-op Pain Management:    Induction:   PONV Risk Score and Plan: Ondansetron  Airway Management Planned: Oral ETT  Additional Equipment:   Intra-op Plan:   Post-operative Plan: Extubation in OR  Informed Consent: I have reviewed the patients History and Physical, chart, labs and discussed the procedure including the risks, benefits and alternatives for the proposed anesthesia with the patient or authorized representative who has indicated his/her understanding and acceptance.     Dental advisory given  Plan Discussed with: CRNA and Surgeon  Anesthesia Plan Comments:       Anesthesia Quick Evaluation

## 2020-07-12 NOTE — Progress Notes (Signed)
Patient Demographics:    Carly Wood, is a 85 y.o. female, DOB - 06-28-1935, QAS:[id number]  Admit date - 07/11/2020   Admitting Physician Ejiroghene Arlyce Dice, MD  Outpatient Primary MD for the patient is [name] [name], NP  LOS - 1   Chief Complaint  Patient presents with  . Fall        Subjective:    [name] today has no fevers, no emesis,  No chest pain,  Rt Hip pain  Assessment  & Plan :    Principal Problem:   Closed right hip fracture ([place]) Active Problems:   Stage 1 infiltrating ductal carcinoma of right female breast (HCC)   Multiple myeloma (HCC)   Diabetes mellitus with ESRD (end-stage renal disease) ([place])   Vascular dementia with behavior disturbance ([place])  Brief Summary:- 85 y.o. female with medical history significant for multiple myeloma, diabetes mellitus, ESRD, dementia, hypertension gastric ulcer admitted with Rt Hip Fx post mechanical fall  A/p 1)Rt Hip Fx-- Hip Xray on Admission shows impacted right basicervical femoral neck fracture. S/p Cephalomedullary nail for Right basicervical intertrochanteric femur fracture by Dr Amedeo Kinsman on 07/12/20 Post-operative plan:  Weightbearing: The patient will be WBAT on the operative extremity.   DVT prophylaxis per primary team, no orthopedic contraindications.  Recommend 81 mg Aspirin BID, unless patient cannot tolerate or was previously on anticoagulation.  Prefer to start Ppx POD#1 Pain control with PRN pain medication preferring oral medicines.   Dressing can be reinforced as needed, will change on POD#2/3 if needed.  Patient does not need to remain hospitalized for dressing change Follow up plan: approximately 2 weeks postop for incision check and XR.  If the patient will be returning to a nursing facility, staples can be removed around this time and a follow up appointment can be scheduled for 6 weeks after surgery. XR at next  visit:  please obtain AP pelvis, and 2 views of the Right hip and femur -await phy therapy eval, will need SNF Rehab  2)ESRD--M/W/F, last HD 07/10/20 HD as per Nephrology team  3)H/o Multiple Myeloma--- chemotherapy with Velcade and dexamethasone as per Dr. Delton Coombes  4)Breast cancer s/p right mastectomy. -c/n Anastrozole  5)Diabetes mellitus-- HgbA1c- 6.2 reflecting excellent diabetic control-- Use Novolog/Humalog Sliding scale insulin with Accu-Cheks/Fingersticks as ordered   6)HTN-- c/n Amlodipine and clonidine  7)HLD--- c/n Atorvastatin and Aspirin  8)Depression--- c/n zoloft 50 mg daily  Disposition/Need for in-Hospital Stay- patient unable to be discharged at this time due to-- will need SNF Rehab   Status is: Inpatient  Remains inpatient appropriate because:-await phy therapy eval, will need SNF Rehab   Disposition: The patient is from: Home              Anticipated d/c is to: SNF              Anticipated d/c date is: 2 days              Patient currently is not medically stable to d/c. Barriers: Not Clinically Stable-   Code Status :  -  Code Status: Full Code   Family Communication:   NA (patient is alert, awake and coherent)   Consults  :  ortho  DVT Prophylaxis  :   -  SCDs  SCDs Start: 07/11/20 2053  Lab Results  Component Value Date   PLT 297 07/12/2020    Inpatient Medications  Scheduled Meds: . acyclovir  200 mg Oral Daily  . [START ON 07/13/2020] amLODipine  10 mg Oral Once per day on Sun Tue Thu Sat  . anastrozole  1 mg Oral Daily  . [START ON 07/13/2020] aspirin EC  81 mg Oral Daily  . atorvastatin  10 mg Oral QHS  . calcium acetate  1,334 mg Oral BID WC  . calcium-vitamin D  1 tablet Oral Daily  . [START ON 07/13/2020] Chlorhexidine Gluconate Cloth  6 each Topical Q0600  . cloNIDine  0.3 mg Oral BID  . insulin aspart  0-9 Units Subcutaneous Q6H  . melatonin  6 mg Oral QHS  . sertraline  50 mg Oral Daily   Continuous Infusions: . tranexamic  acid Stopped (07/12/20 0019)   PRN Meds:.acetaminophen **OR** acetaminophen, HYDROmorphone (DILAUDID) injection, ondansetron **OR** ondansetron (ZOFRAN) IV, polyethylene glycol  Anti-infectives (From admission, onward)   Start     Dose/Rate Route Frequency Ordered Stop   07/12/20 1000  acyclovir (ZOVIRAX) 200 MG capsule 200 mg        200 mg Oral Daily 07/11/20 2052     07/12/20 0600  clindamycin (CLEOCIN) IVPB 900 mg        900 mg 100 mL/hr over 30 Minutes Intravenous On call to O.R. 07/11/20 2151 07/12/20 1217       Objective:   Vitals:   07/12/20 1530 07/12/20 1545 07/12/20 1600 07/12/20 1635  BP: (!) 153/65 (!) 146/65 (!) 152/63 (!) 155/51  Pulse: 74 76 76 74  Resp: _0 Temp:    98.6 F (37 C)  TempSrc:    Oral  SpO2: 94% 100% 96% 98%  Weight:      Height:        Wt Readings from Last 3 Encounters:  07/11/20 67.1 kg  07/05/20 67.1 kg  07/02/20 63.4 kg    Intake/Output Summary (Last 24 hours) at 07/12/2020 1733 Last data filed at 07/12/2020 1355 Gross per 24 hour  Intake 1000 ml  Output 180 ml  Net 820 ml   Physical Exam  Gen:- Awake Alert,  In no apparent distress  HEENT:- [place].AT, No sclera icterus Neck-Supple Neck, HD catheter.  Lungs-fair air movement, no wheezing CV- S1, S2 normal, regular  Abd-  +ve B.Sounds, Abd Soft, No tenderness,    Extremity/Skin:- No  edema, pedal pulses present  Psych-affect is appropriate, oriented x3 Neuro-generalized weakness, no new focal deficits, no tremors MSK- Lt UE AVF noted   Data Review:   Micro Results Recent Results (from the past 240 hour(s))  SARS Coronavirus 2 by RT PCR (hospital order, performed in [facility] hospital lab) Nasopharyngeal Nasopharyngeal Swab     Status: None   Collection Time: 07/05/20  6:45 AM   Specimen: Nasopharyngeal Swab  Result Value Ref Range Status   SARS Coronavirus 2 NEGATIVE NEGATIVE Final    Comment: (NOTE) SARS-CoV-2 target nucleic acids are NOT DETECTED.  The SARS-CoV-2  RNA is generally detectable in upper and lower respiratory specimens during the acute phase of infection. The lowest concentration of SARS-CoV-2 viral copies this assay can detect is 250 copies / mL. A negative result does not preclude SARS-CoV-2 infection and should not be used as the sole basis for treatment or other patient management decisions.  A negative result may occur with improper specimen collection / handling, submission  of specimen other than nasopharyngeal swab, presence of viral mutation(s) within the areas targeted by this assay, and inadequate number of viral copies (<250 copies / mL). A negative result must be combined with clinical observations, patient history, and epidemiological information.  Fact Sheet for Patients:   StrictlyIdeas.no  Fact Sheet for Healthcare Providers: BankingDealers.co.za  This test is not yet approved or  cleared by the [place] FDA and has been authorized for detection and/or diagnosis of SARS-CoV-2 by FDA under an Emergency Use Authorization (EUA).  This EUA will remain in effect (meaning this test can be used) for the duration of the COVID-19 declaration under Section 564(b)(1) of the Act, 21 U.S.C. section 360bbb-3(b)(1), unless the authorization is terminated or revoked sooner.  Performed at [facility], [place] [address] [place], [place] [address]     Radiology Reports DG Chest 1 View  Result Date: 07/11/2020 CLINICAL DATA:  Pain following fall EXAM: CHEST  1 VIEW COMPARISON:  November 22, 2017 FINDINGS: Central catheter tip is at the cavoatrial junction. No pneumothorax. There is slight left base atelectasis. Lungs elsewhere clear. Heart is upper normal in size with pulmonary vascularity normal. There is aortic atherosclerosis. No adenopathy. No bone lesions. There are surgical clips in the right axilla. IMPRESSION: Central catheter tip at cavoatrial junction. No pneumothorax.  Mild left base atelectasis. No edema or airspace opacity. Stable cardiac silhouette. Aortic Atherosclerosis (ICD10-I70.0). Electronically Signed   By: Lowella Grip III M.D.   On: 07/11/2020 15:14   DG Pelvis 1-2 Views  Result Date: 07/12/2020 CLINICAL DATA:  The patient suffered a right hip fracture in a fall 07/11/2020. Initial encounter. EXAM: PELVIS - 1-2 VIEW COMPARISON:  Plain films right hip 07/11/2020. FINDINGS: New intramedullary nail and hip screw are in place for fixation of a right hip fracture. Hardware is intact. The intramedullary nail is incompletely imaged. Position and alignment are near anatomic. No acute abnormality. IMPRESSION: Status post fixation right hip fracture.  No acute finding. Electronically Signed   By: Inge Rise M.D.   On: 07/12/2020 15:07   DG Knee 1-2 Views Right  Result Date: 07/11/2020 CLINICAL DATA:  Recent fall with right knee pain, initial encounter EXAM: RIGHT KNEE - 2 VIEW COMPARISON:  None. FINDINGS: No acute fracture or dislocation is noted. Diffuse vascular calcifications are seen. No soft tissue abnormality is noted. IMPRESSION: No acute abnormality noted. Electronically Signed   By: Inez Catalina M.D.   On: 07/11/2020 15:13   PERIPHERAL VASCULAR CATHETERIZATION  Result Date: 07/05/2020 Procedure: Right arm fistulogram, angioplasty right subclavian innominate vein superior vena cava Preoperative diagnosis: Venous hypertension right arm Postoperative diagnosis: Same Anesthesia: Local Operative findings: 1.  80% stenosis right subclavian and innominate and superior vena cava angioplastied up to a 10 mm balloon with complete elastic recoil and no significant improvement Operative details: After obtaining informed consent, the patient was taken the PV lab.  The patient was placed in supine position the angio table.  Patient's right upper extremities prepped and draped in usual sterile fashion.  Local anesthesia was infiltrated over the right upper arm AV  fistula.  Micropuncture needle was used to cannulate the right arm AV fistula under ultrasound guidance.  Micropuncture wire was advanced into the fistula.  Micropuncture sheath was placed over this.  Right arm fistulogram and central venogram was then performed through the sheath.  The superior vena cava innominate vein and subclavian veins on the right side are all patent.  However there were multiple  segments areas of narrowing up to 80%.  There are abundant collaterals around the shoulder.  The remainder of the AV fistula is widely patent.  Compression on the outflow to reflux contrast across the arterial anastomosis showed this was widely patent as well. At this point the sheath was accessed with an 035 versa core wire and advanced across the central vein stenosis into the right atrium.  This was assisted with a KMP catheter.  A 7 French sheath was then placed over the wire.  Patient was given 3000 units of intravenous heparin.  I then angioplastied 3 discrete lesions in the right subclavian right innominate and superior vena cava utilizing multiple inflations of a 10 x 40 balloon.  Due to the recoil nature of the vein the balloon Sliding forward.  Therefore I swapped this out for a 1080 balloon.  This was then inflated to 8 atm for 1 minute.  Completion angiogram really showed no significant improvement and complete Recoil of the narrowing.  A similar procedure had been performed approximately 30 days ago.  In light of this I felt my options were to either stent her central vein potentially encroaching upon the left innominate system and jailing the right internal jugular vein or abandoning this in favor of placing a thigh graft in the near future.  I opted not to stent the central vein segment. At this point the guidewire was removed the sheath was removed and the fistula opening closed with a single figure-of-eight Monocryl stitch. Operative management: The patient has had previous access procedures which  failed in the left arm.  She now has venous hypertension and multiple areas of narrowing in the right innominate superior vena cava on the right side.  I do not believe we are going to obtain significant durability of her right arm fistula even if we stent this segment.  We will schedule the patient for outpatient follow-up for consideration of a thigh graft. Ruta Hinds, MD [facility] Office: [phone]   VAS [place] (AVF,AVG)  Result Date: 06/20/2020 DIALYSIS ACCESS Reason for Exam: Routine follow up. Access Site: Right Upper Extremity. Access Type: Brachial-cephalic AVF placed 7/37/1062. History: History of left brachiocephalic AVF. Performing Technologist: Alvia Grove RVT  Examination Guidelines: A complete evaluation includes B-mode imaging, spectral Doppler, color Doppler, and power Doppler as needed of all accessible portions of each vessel. Unilateral testing is considered an integral part of a complete examination. Limited examinations for reoccurring indications may be performed as noted.  Findings: +--------------------+----------+-----------------+--------+ AVF                 PSV (cm/s)Flow Vol (mL/min)Comments +--------------------+----------+-----------------+--------+ Native artery inflow   161           901                +--------------------+----------+-----------------+--------+ AVF Anastomosis        327                              +--------------------+----------+-----------------+--------+  +------------+----------+-------------+-----------+----------------------------+ OUTFLOW VEINPSV (cm/s)Diameter (cm)Depth (cm)           Describe           +------------+----------+-------------+-----------+----------------------------+ Prox UA         79        0.58        0.86    competing branch 41cm/s 0.30  cm dia             +------------+----------+-------------+-----------+----------------------------+ Mid UA         127        0.59        1.02                                 +------------+----------+-------------+-----------+----------------------------+ Dist UA     138 / 154     0.62        0.53     tortuous, slight narrowing  +------------+----------+-------------+-----------+----------------------------+ AC Fossa    161 / 634  1.08 / 0.31 0.26 / 0.57 sharp curve into brachial   +------------+----------+-------------+-----------+----------------------------+   Summary: Patent arteriovenous fistula with sharp curve in the antecubital fossa entering Brachial artery with increased velocity. . *See table(s) above for measurements and observations.  Diagnosing physician: Ruta Hinds MD Electronically signed by Ruta Hinds MD on 06/20/2020 at 2:20:22 PM.    --------------------------------------------------------------------------------   Final    DG HIP OPERATIVE UNILAT WITH PELVIS RIGHT  Result Date: 07/12/2020 CLINICAL DATA:  Hip fracture EXAM: OPERATIVE right HIP (WITH PELVIS IF PERFORMED) 14 VIEWS TECHNIQUE: Fluoroscopic spot image(s) were submitted for interpretation post-operatively. COMPARISON:  07/11/2020 FINDINGS: Fourteen low resolution intraoperative spot views of the right hip. Total fluoroscopy time was 1 minutes 50 seconds. The images demonstrate basicervical/intertrochanteric fracture with subsequent intramedullary rodding and distal screw fixation with anatomic alignment. IMPRESSION: Intraoperative fluoroscopic assistance provided during surgical fixation of proximal right femur fracture. Electronically Signed   By: Donavan Foil M.D.   On: 07/12/2020 16:02   DG Hip Unilat  With Pelvis 2-3 Views Right  Result Date: 07/11/2020 CLINICAL DATA:  Fall, right hip deformity EXAM: DG HIP (WITH OR WITHOUT PELVIS) 2-3V RIGHT COMPARISON:  None. FINDINGS: Single view radiograph pelvis and two view  radiograph of the right hip demonstrates an acute, mildly impacted, angulated basicervical right femoral neck fracture with mild posterior and moderate varus angulation. The femoral head is still seated within the right acetabulum. Mild-to-moderate degenerative hip arthritis is present with joint space narrowing noted bilaterally. The pelvis and left hip are intact. Multiple involuted fibroids are seen within the pelvis. Advanced vascular calcifications are seen within the pelvis and medial thighs. IMPRESSION: Acute, impacted, angulated right basicervical femoral neck fracture. Electronically Signed   By: Fidela Salisbury MD   On: 07/11/2020 15:14   DG FEMUR PORT, MIN 2 VIEWS RIGHT  Result Date: 07/12/2020 CLINICAL DATA:  Postop femoral ORIF. EXAM: RIGHT FEMUR PORTABLE 2 VIEW COMPARISON:  Intraoperative views earlier the same date. Radiographs 07/11/2020. FINDINGS: Status post right femoral dynamic screw and intramedullary fixation of the comminuted intertrochanteric femur fracture. There is near anatomic reduction of the fracture. The hardware is well positioned. Lateral skin staples are present superior to the greater trochanter, and there is soft tissue emphysema laterally in the proximal to mid right thigh. No complications identified. Calcified uterine fibroids and femoral atherosclerosis are noted. IMPRESSION: Near anatomic reduction of intertrochanteric right femur fracture post ORIF. Electronically Signed   By: Richardean Sale M.D.   On: 07/12/2020 15:06     CBC Recent Labs  Lab 07/11/20 1642 07/12/20 0905  WBC 9.1 10.6*  HGB 10.6* 10.8*  HCT 34.6* 34.8*  PLT 248 297  MCV 109.1* 107.7*  MCH 33.4 33.4  MCHC 30.6 31.0  RDW 15.9* 15.9*  LYMPHSABS 1.0  --   MONOABS 1.1*  --  EOSABS 0.2  --   BASOSABS 0.0  --     Chemistries  Recent Labs  Lab 07/11/20 1602 07/11/20 1750 07/12/20 0905  NA 134*  --  135  K 4.9  --  4.9  CL 93*  --  93*  CO2 30  --  27  GLUCOSE 79  --  147*  BUN  45*  --  53*  CREATININE 7.71*  --  9.68*  CALCIUM 8.1*  --  8.5*  MG  --  2.4  --   AST 33  --   --   ALT 15  --   --   ALKPHOS 68  --   --   BILITOT 0.7  --   --    ------------------------------------------------------------------------------------------------------------------ No results for input(s): CHOL, HDL, LDLCALC, TRIG, CHOLHDL, LDLDIRECT in the last 72 hours.  Lab Results  Component Value Date   HGBA1C 6.2 (H) 06/13/2020   ------------------------------------------------------------------------------------------------------------------ No results for input(s): TSH, T4TOTAL, T3FREE, THYROIDAB in the last 72 hours.  Invalid input(s): FREET3 ------------------------------------------------------------------------------------------------------------------ No results for input(s): VITAMINB12, FOLATE, FERRITIN, TIBC, IRON, RETICCTPCT in the last 72 hours.  Coagulation profile No results for input(s): INR, PROTIME in the last 168 hours.  No results for input(s): DDIMER in the last 72 hours.  Cardiac Enzymes No results for input(s): CKMB, TROPONINI, MYOGLOBIN in the last 168 hours.  Invalid input(s): CK ------------------------------------------------------------------------------------------------------------------ No results found for: BNP   Roxan Hockey M.D on 07/12/2020 at 5:33 PM  Go to [email or web address] - for contact info  [facility] - Office  [phone]

## 2020-07-12 NOTE — Consult Note (Signed)
[facility] Renal Consultation Note  Requesting IO:EVOJJKK Emokpae, MD Indication for Consultation:  ESRD  Chief complaint: fell and broke hip  HPI:  Carly Wood is a 85 y.o. female with a history of ESRD on HD MWF, HTN, DM, and multiple myeloma who presented to the ER after a fall at her SNF.  She was found to have an acute right femoral neck fracture.  She was taken to the OR for operative repair 5/6 and did not receive her dialysis  She has no current complaints.  Potassium was 6.7 this morning.  Hemoglobin of 8.9.  She received dialysis at [facility] in [place] on MWF schedule.  Uses a TDC. PMHx:   Past Medical History:  Diagnosis Date  . Anemia   . Anxiety   . Chronic kidney disease   . Chronic renal disease, stage 4, severely decreased glomerular filtration rate (GFR) between 15-29 mL/min/1.73 square meter (HCC) 08/22/2015  . Complication of anesthesia    delirious after Breast Surgery  . Dementia ([place])    mild  . Depression   . Diabetes mellitus without complication ([place])    type II  . Dyspnea    with activity  . GERD (gastroesophageal reflux disease)   . Glaucoma   . Hypertension   . Pneumonia   . Stage 1 infiltrating ductal carcinoma of right female breast ([place]) 08/21/2015   ER+ PR+ HER 2 neu + (3+) T1cN0     Past Surgical History:  Procedure Laterality Date  . A/V FISTULAGRAM Right 07/05/2020   Procedure: A/V Fistulagram;  Surgeon: Elam Dutch, MD;  Location: [place] [facility];  Service: Cardiovascular;  Laterality: Right;  . AV FISTULA PLACEMENT Left 11/22/2017   Procedure: ARTERIOVENOUS (AV) FISTULA CREATION LEFT ARM;  Surgeon: Elam Dutch, MD;  Location: [facility] OR;  Service: Vascular;  Laterality: Left;  . AV FISTULA PLACEMENT Right 04/04/2020   Procedure: RIGHT ARM ARTERIOVENOUS FISTULA CREATION;  Surgeon: Rosetta Posner, MD;  Location: [facility];  Service: Vascular;  Laterality: Right;  . BIOPSY  08/07/2016   Procedure: BIOPSY;  Surgeon: Daneil Dolin, MD;  [facility];  Service: Endoscopy;;  gastric ulcer biopsy  . COLONOSCOPY    . ESOPHAGOGASTRODUODENOSCOPY N/A 08/07/2016   Procedure: ESOPHAGOGASTRODUODENOSCOPY (EGD);  Surgeon: Daneil Dolin, MD;  [facility];  Service: Endoscopy;  Laterality: N/A;  1215-rescheduled to 6/1 @ 2:30pm per Tretha Sciara  . ESOPHAGOGASTRODUODENOSCOPY N/A 11/27/2016   Procedure: ESOPHAGOGASTRODUODENOSCOPY (EGD);  Surgeon: Daneil Dolin, MD;  [facility];  Service: Endoscopy;  Laterality: N/A;  8:15am  . FISTULA SUPERFICIALIZATION Left 02/14/2018   Procedure: FISTULA SUPERFICIALIZATION LEFT ARM;  Surgeon: Angelia Mould, MD;  Location: [place];  Service: Vascular;  Laterality: Left;  . FRACTURE SURGERY Right    ankle  . MALONEY DILATION N/A 08/07/2016   Procedure: Venia Minks DILATION;  Surgeon: Daneil Dolin, MD;  [facility];  Service: Endoscopy;  Laterality: N/A;  . MASTECTOMY, PARTIAL Right   . PERIPHERAL VASCULAR BALLOON ANGIOPLASTY Left 07/13/2019   Procedure: PERIPHERAL VASCULAR BALLOON ANGIOPLASTY;  Surgeon: Marty Heck, MD;  Location: [place] [facility];  Service: Cardiovascular;  Laterality: Left;  arm fistulogram  . PERIPHERAL VASCULAR BALLOON ANGIOPLASTY Right 05/22/2020   Procedure: PERIPHERAL VASCULAR BALLOON ANGIOPLASTY;  Surgeon: Cherre Robins, MD;  Location: [place] [facility];  Service: Cardiovascular;  Laterality: Right;  arm fistula  . PERIPHERAL VASCULAR BALLOON ANGIOPLASTY Right 07/05/2020  Procedure: PERIPHERAL VASCULAR BALLOON ANGIOPLASTY;  Surgeon: Elam Dutch, MD;  Location: [place] [facility];  Service: Cardiovascular;  Laterality: Right;  arm fistula  . PORT-A-CATH REMOVAL Left 11/22/2017   Procedure: REMOVAL PORT-A-CATH LEFT CHEST;  Surgeon: Elam Dutch, MD;  Location: [facility] OR;  Service: Vascular;  Laterality: Left;  . RETINAL DETACHMENT SURGERY Right     Family Hx:  Family History  Problem Relation Age of  Onset  . Multiple myeloma Sister   . Brain cancer Sister   . Dementia Mother        died at 75  . Stroke Mother   . Heart failure Mother   . Diabetes Mother   . Heart disease Father   . Prostate cancer Brother   . Colon cancer Neg Hx     Social History:  reports that she has never smoked. She has never used smokeless tobacco. She reports that she does not drink alcohol and does not use drugs.  Allergies:  Allergies  Allergen Reactions  . Ace Inhibitors Other (See Comments) and Cough    Tongue swell  . Penicillins Other (See Comments)    Unsure of reaction Has patient had a PCN reaction causing immediate rash, facial/tongue/throat swelling, SOB or lightheadedness with hypotension: Unknown Has patient had a PCN reaction causing severe rash involving mucus membranes or skin necrosis: Unknown Has patient had a PCN reaction that required hospitalization: No Has patient had a PCN reaction occurring within the last 10 years: Unknown If all of the above answers are "NO", then may proceed with Cephalosporin use.      Medications: Prior to Admission medications   Medication Sig Start Date End Date Taking? Authorizing Provider  acetaminophen (TYLENOL) 650 MG CR tablet Take 650 mg by mouth every 6 (six) hours. 01/09/20  Yes [provider]  acyclovir (ZOVIRAX) 200 MG capsule Take 200 mg by mouth daily. 10/20/19  Yes [provider]  amLODipine (NORVASC) 10 MG tablet Take 10 mg by mouth daily. Not taking on dialysis days 01/25/18  Yes [provider]  anastrozole (ARIMIDEX) 1 MG tablet TAKE 1 TABLET BY MOUTH DAILY Patient taking differently: Take 1 mg by mouth daily. 10/30/19  Yes Derek Jack, MD  aspirin EC 81 MG tablet Take 81 mg by mouth daily.   Yes [provider]  atorvastatin (LIPITOR) 10 MG tablet Take 10 mg by mouth at bedtime.   Yes [provider]  calcium acetate (PHOSLO) 667 MG capsule Take 667-1,334 mg by mouth See admin  instructions. Take 1,334 mg twice daily with meals and 667 with snacks daily   Yes [provider]  calcium-vitamin D (OSCAL WITH D) 500-200 MG-UNIT tablet Take 1 tablet by mouth daily. At 8 am   Yes [provider]  cloNIDine (CATAPRES) 0.3 MG tablet Take 0.3 mg by mouth 2 (two) times daily.    Yes [provider]  insulin aspart (NOVOLOG FLEXPEN) 100 UNIT/ML FlexPen Inject 10 Units into the skin 2 (two) times daily. 8 am and 5 pm, Hold for accu-check less than 150. 01/15/20  Yes [provider]  insulin aspart (NOVOLOG FLEXPEN) 100 UNIT/ML FlexPen Inject 15 Units into the skin 2 (two) times daily. Hold for accu-check less than 150   Yes [provider]  insulin glargine (LANTUS SOLOSTAR) 100 UNIT/ML Solostar Pen Inject 10 Units into the skin at bedtime. 01/23/20  Yes [provider]  melatonin 3 MG TABS tablet Take 6 mg by mouth at  bedtime. For Sleep 10/17/19  Yes [provider]  Multiple Vitamin (MULTIVITAMIN) capsule Take 1 capsule by mouth daily.   Yes [provider]  sertraline (ZOLOFT) 50 MG tablet Take 50 mg by mouth daily.   Yes [provider]  NON FORMULARY Diet: _____ Regular, ___x___ NAS, _______Consistent Carbohydrate, _______NPO __x___Other Low Potassium Patient not taking: Reported on 07/11/2020 12/22/19   [provider]  Nutritional Supplements (NEPRO PO) Take 1 Can by mouth at bedtime. Patient not taking: Reported on 07/11/2020 11/11/19   [provider]    I have reviewed the patient's current and reported prior to admission medications.  Labs:  BMP Latest Ref Rng & Units 07/12/2020 07/11/2020 07/05/2020  Glucose 70 - 99 mg/dL 147(H) 79 111(H)  BUN 8 - 23 mg/dL 53(H) 45(H) 59(H)  Creatinine 0.44 - 1.00 mg/dL 9.68(H) 7.71(H) 9.90(H)  Sodium 135 - 145 mmol/L 135 134(L) 131(L)  Potassium 3.5 - 5.1 mmol/L 4.9 4.9 4.9  Chloride 98 - 111 mmol/L 93(L) 93(L) 99  CO2 22 - 32 mmol/L 27 30 -   Calcium 8.9 - 10.3 mg/dL 8.5(L) 8.1(L) -   ROS: Balance of 12 systems negative  Physical Exam: Vitals:   07/12/20 1041 07/12/20 1130  BP: (!) 158/70 (!) 142/76  Pulse:    Resp: 16 17  Temp:    SpO2: 98% 95%     General: Elderly female, no distress HEENT: NCAT, Eyes: EOMI Heart: Regular, normal S1 and S2 Lungs: Coarse breath sounds bilaterally Abdomen: Soft, nontender Extremities: No significant edema Skin: Left IJ TDC bandaged, exit site not seen  Assessment/Plan: 48F ESRD MWF TDC at [facility] s/p hip fracture and s/p operative repair 07/12/20  # ESRD  - HD per MWF schedule usually -off schedule and unable to complete dialysis yesterday unusual day; will receive today and then back on schedule next week.  #Hyperkalemia: HD today  # acute right femoral neck fracture - Please limit IV fluids  - please avoid morphine for pain given ESRD - low dose dilaudid or fentanyl ok if IV med is needed   # HTN  - monitor on home regimen   # Anemia CKD  - no acute need for PRBC's - Obtain outpatient records   # Metabolic bone disease - low phos diet when taking PO and continue home binders   Rexene Agent  07/12/2020, 12:58 PM

## 2020-07-12 NOTE — Anesthesia Postprocedure Evaluation (Signed)
Anesthesia Post Note  Patient: Carly Wood  Procedure(s) Performed: INTRAMEDULLARY (IM) NAIL INTERTROCHANTRIC (Right Hip)  Patient location during evaluation: PACU Anesthesia Type: General Level of consciousness: awake and alert and oriented Pain management: pain level controlled Vital Signs Assessment: post-procedure vital signs reviewed and stable Respiratory status: spontaneous breathing and respiratory function stable Cardiovascular status: blood pressure returned to baseline and stable Postop Assessment: no apparent nausea or vomiting Anesthetic complications: no   No complications documented.   Last Vitals:  Vitals:   07/12/20 1600 07/12/20 1635  BP: (!) 152/63 (!) 155/51  Pulse: 76 74  Resp: 16 16  Temp:  37 C  SpO2: 96% 98%    Last Pain:  Vitals:   07/12/20 1635  TempSrc: Oral  PainSc:                  Latiya Navia C Dylan Monforte

## 2020-07-12 NOTE — Transfer of Care (Signed)
Immediate Anesthesia Transfer of Care Note  Patient: Carly Wood  Procedure(s) Performed: INTRAMEDULLARY (IM) NAIL INTERTROCHANTRIC (Right Hip)  Patient Location: PACU  Anesthesia Type:General  Level of Consciousness: awake, alert  and oriented  Airway & Oxygen Therapy: Patient Spontanous Breathing and Patient connected to face mask oxygen  Post-op Assessment: Report given to RN and Post -op Vital signs reviewed and stable  Post vital signs: Reviewed and stable  Last Vitals:  Vitals Value Taken Time  BP 176/78 07/12/20 1417  Temp    Pulse    Resp 15 07/12/20 1422  SpO2    Vitals shown include unvalidated device data.  Last Pain:  Vitals:   07/12/20 1044  TempSrc:   PainSc: 5       Patients Stated Pain Goal: 0 (39/58/44 1712)  Complications: No complications documented.

## 2020-07-12 NOTE — Progress Notes (Signed)
Spoke to pt about surgery for right hip.  Asked if a doctor had spoken to her about plan, she responded that no one had.  Consent not signed at this time due to pt not sure of plan.

## 2020-07-12 NOTE — Progress Notes (Signed)
   ORTHOPAEDIC PROGRESS NOTE  s/p Procedure(s): Cephalomedullary nail for right basicervical intertrochanteric femur fracture  DOS: 07/12/2020  SUBJECTIVE: No issues over night.  A little confusion.  She says her right leg hurts but doesn't realize she has had surgery already.  She does not want to go to dialysis today.   OBJECTIVE: PE:  Alert, confused  Dressing is clean, dry and intact Sensation intact to dorsum and plantar foot Active motion intact TA/EHL/GS 2+ DP pulse  Vitals:   07/12/20 1815 07/12/20 2056  BP: 135/65 (!) 136/58  Pulse: 91 97  Resp:  15  Temp:  98.8 F (37.1 C)  SpO2: 100% 100%   XR of the right femur demonstrates excellent position of the cephalomedullary nail.  No acute injuries.   ASSESSMENT: Carly Wood is a 85 y.o. female doing well postoperatively.  PLAN: Weightbearing: WBAT RLE Insicional and dressing care: Reinforce dressings as needed Orthopedic device(s): None VTE prophylaxis: Aspirin 81mg  BID 6 weeks Pain control: PRN medications, PO preferred Follow - up plan: 2 weeks   Contact information:     Daielle Melcher A. Amedeo Kinsman, MD [facility] [place] [address] [place],  [place]  [address] Phone: [phone] Fax: [phone]

## 2020-07-13 ENCOUNTER — Other Ambulatory Visit: Payer: Self-pay

## 2020-07-13 DIAGNOSIS — Z992 Dependence on renal dialysis: Secondary | ICD-10-CM | POA: Diagnosis present

## 2020-07-13 DIAGNOSIS — N186 End stage renal disease: Secondary | ICD-10-CM | POA: Diagnosis present

## 2020-07-13 DIAGNOSIS — E875 Hyperkalemia: Secondary | ICD-10-CM

## 2020-07-13 DIAGNOSIS — F0151 Vascular dementia with behavioral disturbance: Secondary | ICD-10-CM

## 2020-07-13 LAB — BASIC METABOLIC PANEL
Anion gap: 17 — ABNORMAL HIGH (ref 5–15)
BUN: 66 mg/dL — ABNORMAL HIGH (ref 8–23)
CO2: 22 mmol/L (ref 22–32)
Calcium: 8.2 mg/dL — ABNORMAL LOW (ref 8.9–10.3)
Chloride: 96 mmol/L — ABNORMAL LOW (ref 98–111)
Creatinine, Ser: 11.17 mg/dL — ABNORMAL HIGH (ref 0.44–1.00)
GFR, Estimated: 3 mL/min — ABNORMAL LOW (ref >60)
Glucose, Bld: 178 mg/dL — ABNORMAL HIGH (ref 70–99)
Potassium: 6.3 mmol/L (ref 3.5–5.1)
Sodium: 135 mmol/L (ref 135–145)

## 2020-07-13 LAB — CBC
HCT: 29.7 % — ABNORMAL LOW (ref 36.0–46.0)
Hemoglobin: 8.9 g/dL — ABNORMAL LOW (ref 12.0–15.0)
MCH: 32.6 pg (ref 26.0–34.0)
MCHC: 30 g/dL (ref 30.0–36.0)
MCV: 108.8 fL — ABNORMAL HIGH (ref 80.0–100.0)
Platelets: 303 10*3/uL (ref 150–400)
RBC: 2.73 MIL/uL — ABNORMAL LOW (ref 3.87–5.11)
RDW: 16.1 % — ABNORMAL HIGH (ref 11.5–15.5)
WBC: 11.4 10*3/uL — ABNORMAL HIGH (ref 4.0–10.5)
nRBC: 0 % (ref 0.0–0.2)

## 2020-07-13 LAB — GLUCOSE, CAPILLARY
Glucose-Capillary: 157 mg/dL — ABNORMAL HIGH (ref 70–99)
Glucose-Capillary: 172 mg/dL — ABNORMAL HIGH (ref 70–99)
Glucose-Capillary: 173 mg/dL — ABNORMAL HIGH (ref 70–99)
Glucose-Capillary: 203 mg/dL — ABNORMAL HIGH (ref 70–99)
Glucose-Capillary: 261 mg/dL — ABNORMAL HIGH (ref 70–99)

## 2020-07-13 MED ORDER — SODIUM CHLORIDE 0.9 % IV SOLN: 100.0000 mL | INTRAVENOUS | Status: DC | PRN

## 2020-07-13 MED ORDER — LORAZEPAM 2 MG/ML IJ SOLN: 0.5000 mg | Freq: Four times a day (QID) | INTRAMUSCULAR | Status: DC | PRN

## 2020-07-13 MED ORDER — MECLIZINE HCL 12.5 MG PO TABS
25.0000 mg | ORAL_TABLET | Freq: Two times a day (BID) | ORAL | Status: DC | PRN
  Administered 2020-07-13: 25 mg via ORAL
  Filled 2020-07-13: qty 2

## 2020-07-13 MED ORDER — ALPRAZOLAM 0.5 MG PO TABS
0.5000 mg | ORAL_TABLET | Freq: Once | ORAL | Status: AC
  Administered 2020-07-13: 0.5 mg via ORAL
  Filled 2020-07-13: qty 1

## 2020-07-13 MED ORDER — HEPARIN SODIUM (PORCINE) 1000 UNIT/ML DIALYSIS
1000.0000 [IU] | INTRAMUSCULAR | Status: DC | PRN
  Administered 2020-07-13: 3800 [IU] via INTRAVENOUS_CENTRAL

## 2020-07-13 MED ORDER — ALTEPLASE 2 MG IJ SOLR
2.0000 mg | Freq: Once | INTRAMUSCULAR | Status: AC | PRN
  Administered 2020-07-13: 2 mg

## 2020-07-13 NOTE — Progress Notes (Signed)
   07/13/20 1922  Assess: MEWS Score  Temp 99.3 F (37.4 C)  BP 140/60  Pulse Rate (!) 128  Resp (!) 24  SpO2 97 %  O2 Device Nasal Cannula  O2 Flow Rate (L/min) 2 L/min  Assess: MEWS Score  MEWS Temp 0  MEWS Systolic 0  MEWS Pulse 2  MEWS RR 1  MEWS LOC 0  MEWS Score 3  MEWS Score Color Yellow  Treat  Pain Score 8  Pain Type Surgical pain  Pain Location Hip  Pain Orientation Right  Pain Intervention(s) Medication (See eMAR)  Take Vital Signs  Increase Vital Sign Frequency  Yellow: Q 2hr X 2 then Q 4hr X 2, if remains yellow, continue Q 4hrs  Escalate  MEWS: Escalate Yellow: discuss with charge nurse/RN and consider discussing with provider and RRT  Notify: Charge Nurse/RN  Name of Charge Nurse/RN Notified Larene Beach  Date Charge Nurse/RN Notified 07/13/20  Time Charge Nurse/RN Notified 1922  Notify: Provider  Provider Name/Title Emokpae  Date Provider Notified 07/13/20  Time Provider Notified 1845  Notification Type Face-to-face  Notification Reason Change in status  Provider response At bedside;See new orders  Date of Provider Response 07/13/20  Time of Provider Response 1845  Document  Patient Outcome Not stable and remains on department (given one time po ativan for anxiety, dilaudid for pain  and meclizine for feeling of dizzinessand falling while being transported on bed from dialysis unit.  Currently feeding self supper.  EKG performed as well)  Progress note created (see row info) Yes

## 2020-07-13 NOTE — Procedures (Signed)
   HEMODIALYSIS TREATMENT NOTE:  HR 100-112 for first 2+ hours of treatment then, sudden onset tachycardia in 130s with corresponding anxiety.  Pt with wide-eyed stare,  Repeatedly saying "stop me from rolling."  Tachypneic, alert, baseline confused.  BP 130s/60s.  UF was paused, Qb was decreased.  EKG confirmed ST.  Treatment stopped after d/w Dr. Joelyn Oms.  Pt received almost 3 hours HD.  All blood was returned.  Pt was assessed at bedside by Dr. Maurene Capes upon return to her room.  Meclizine and xanax were ordered / given by primary RN.   Rockwell Alexandria, RN

## 2020-07-13 NOTE — Progress Notes (Signed)
   07/13/20 1400  Assess: MEWS Score  Temp 98.7 F (37.1 C)  BP (!) 113/57  Pulse Rate (!) 112  Resp 20  SpO2 94 %  O2 Device Nasal Cannula  O2 Flow Rate (L/min) 2 L/min  Assess: MEWS Score  MEWS Temp 0  MEWS Systolic 0  MEWS Pulse 2  MEWS RR 0  MEWS LOC 0  MEWS Score 2  MEWS Score Color Yellow  Treat  Pain Score 10  Pain Type Surgical pain  Take Vital Signs  Increase Vital Sign Frequency  Yellow: Q 2hr X 2 then Q 4hr X 2, if remains yellow, continue Q 4hrs  Escalate  MEWS: Escalate Yellow: discuss with charge nurse/RN and consider discussing with provider and RRT  Notify: Charge Nurse/RN  Name of Charge Nurse/RN Notified Larene Beach  Date Charge Nurse/RN Notified 07/13/20  Time Charge Nurse/RN Notified 3154  Document  Patient Outcome Stabilized after interventions;Other (Comment) (O2 canula was off, replaced)  Progress note created (see row info) Yes

## 2020-07-13 NOTE — Progress Notes (Signed)
Patient Demographics:    Carly Wood, is a 85 y.o. female, DOB - [birth year], WJX:[id number]  Admit date - 07/11/2020   Admitting Physician Ejiroghene Arlyce Dice, MD  Outpatient Primary MD for the patient is Carly Wood [name], NP  LOS - 2   Chief Complaint  Patient presents with  . Fall        Subjective:    [name] today has no fevers, no emesis,  No chest pain,  C/o Rt Hip pain  Episodes of confusion and disorientation--  Assessment  & Plan :    Principal Problem:   Closed right hip fracture (HCC) Active Problems:   ESRD (end stage renal disease) (HCC)   Stage 1 infiltrating ductal carcinoma of right female breast (HCC)   Multiple myeloma (HCC)   Diabetes mellitus with ESRD (end-stage renal disease) ([name])   Anemia due to end stage renal disease ([name])   Vascular dementia with behavior disturbance (HCC)   Dependence on renal dialysis ([name])   Hyperkalemia  Brief Summary:- 85 y.o. female with medical history significant for multiple myeloma, diabetes mellitus, ESRD, dementia, hypertension gastric ulcer admitted with Rt Hip Fx post mechanical fall  A/p 1)Rt Hip Fx-- POA--- -Hip Xray on Admission shows impacted right basicervical femoral neck fracture. -S/p Cephalomedullary nail for Right basicervical intertrochanteric femur fracture by Dr Amedeo Kinsman on 07/12/20 Post-operative plan:  Weightbearing: The patient will be WBAT on the operative extremity.   DVT prophylaxis :- orthopedics Recommended 81 mg Aspirin BID,  Dressing can be reinforced as needed, will change on POD#2/3 if needed.  Patient does not need to remain hospitalized for dressing change Follow up plan: approximately 2 weeks postop for incision check and XR.  If the patient will be returning to a nursing facility, staples can be removed around this time and a follow up appointment can be scheduled for 6 weeks after surgery. XR at next  office visit:  please obtain AP pelvis, and 2 views of the Right hip and femur -await phy therapy eval, will need SNF Rehab  2)ESRD--M/W/F, last HD 07/10/20,  Plan is for HD on 07/13/20 HD as per Nephrology team  3)H/o Multiple Myeloma--- chemotherapy with Velcade and dexamethasone as per Dr. Delton Coombes  4)Breast cancer s/p right mastectomy. -c/n Anastrozole  5)Diabetes mellitus-- HgbA1c- 6.2 reflecting excellent diabetic control-- Use Novolog/Humalog Sliding scale insulin with Accu-Cheks/Fingersticks as ordered   6)HTN-- c/n Amlodipine and clonidine  7)HLD--- c/n Atorvastatin and Aspirin  8)Depression--- c/n zoloft 50 mg daily  9)HyperKalemia in an ESRD pt--- plan is for EGD on 07/14/2018  Disposition/Need for in-Hospital Stay- patient unable to be discharged at this time due to-- will need SNF Rehab   Status is: Inpatient  Remains inpatient appropriate because:-await phy therapy eval, will need SNF Rehab  Disposition: The patient is from: Home              Anticipated d/c is to: SNF              Anticipated d/c date is: 2 days              Patient currently is not medically stable to d/c. Barriers: Not Clinically Stable-   Code Status :  -  Code Status: Full Code  Family Communication:  I called and updated her sister Ms Lorri Fukuhara [phone]   Consults  :  ortho  DVT Prophylaxis  :   - SCDs  SCDs Start: 07/11/20 2053  Lab Results  Component Value Date   PLT 303 07/13/2020    Inpatient Medications  Scheduled Meds: . acyclovir  200 mg Oral Daily  . amLODipine  10 mg Oral Once per day on Sun Tue Thu Sat  . anastrozole  1 mg Oral Daily  . aspirin EC  81 mg Oral BID  . atorvastatin  10 mg Oral QHS  . calcium acetate  1,334 mg Oral BID WC  . calcium-vitamin D  1 tablet Oral Daily  . Chlorhexidine Gluconate Cloth  6 each Topical Q0600  . cloNIDine  0.3 mg Oral BID  . insulin aspart  0-9 Units Subcutaneous Q6H  . melatonin  6 mg Oral QHS  . sertraline  50  mg Oral Daily   Continuous Infusions:  PRN Meds:.acetaminophen **OR** acetaminophen, HYDROmorphone (DILAUDID) injection, ondansetron **OR** ondansetron (ZOFRAN) IV, polyethylene glycol  Anti-infectives (From admission, onward)   Start     Dose/Rate Route Frequency Ordered Stop   07/12/20 1000  acyclovir (ZOVIRAX) 200 MG capsule 200 mg        200 mg Oral Daily 07/11/20 2052     07/12/20 0600  clindamycin (CLEOCIN) IVPB 900 mg        900 mg 100 mL/hr over 30 Minutes Intravenous On call to O.R. 07/11/20 2151 07/12/20 1217       Objective:   Vitals:   07/12/20 1815 07/12/20 2056 07/13/20 0307 07/13/20 0652  BP: 135/65 (!) 136/58 132/61 (!) 132/41  Pulse: 91 97 (!) 108 100  Resp:  _0 Temp:  98.8 F (37.1 C) 98.8 F (37.1 C) 98.3 F (36.8 C)  TempSrc:  Oral Oral Oral  SpO2: 100% 100% 100% 100%  Weight:      Height:        Wt Readings from Last 3 Encounters:  07/11/20 67.1 kg  07/05/20 67.1 kg  07/02/20 63.4 kg    Intake/Output Summary (Last 24 hours) at 07/13/2020 1345 Last data filed at 07/13/2020 0900 Gross per 24 hour  Intake 1120 ml  Output 180 ml  Net 940 ml   Physical Exam  Gen:- Awake Alert,  In no apparent distress  HEENT:- [place].AT, No sclera icterus Neck-Supple Neck, HD catheter.  Lungs-fair air movement, no wheezing CV- S1, S2 normal, regular  Abd-  +ve B.Sounds, Abd Soft, No tenderness,    Extremity/Skin:- No  edema, pedal pulses present  Psych-affect is appropriate, oriented x3 Neuro-generalized weakness, no new focal deficits, no tremors MSK- Lt UE AVF noted, Rt Hip post op wound is dry/intact   Data Review:   Micro Results Recent Results (from the past 240 hour(s))  SARS Coronavirus 2 by RT PCR (hospital order, performed in [facility] hospital lab) Nasopharyngeal Nasopharyngeal Swab     Status: None   Collection Time: 07/05/20  6:45 AM   Specimen: Nasopharyngeal Swab  Result Value Ref Range Status   SARS Coronavirus 2 NEGATIVE NEGATIVE Final     Comment: (NOTE) SARS-CoV-2 target nucleic acids are NOT DETECTED.  The SARS-CoV-2 RNA is generally detectable in upper and lower respiratory specimens during the acute phase of infection. The lowest concentration of SARS-CoV-2 viral copies this assay can detect is 250 copies / mL. A negative result does not preclude SARS-CoV-2 infection and should not be used  as the sole basis for treatment or other patient management decisions.  A negative result may occur with improper specimen collection / handling, submission of specimen other than nasopharyngeal swab, presence of viral mutation(s) within the areas targeted by this assay, and inadequate number of viral copies (<250 copies / mL). A negative result must be combined with clinical observations, patient history, and epidemiological information.  Fact Sheet for Patients:   StrictlyIdeas.no  Fact Sheet for Healthcare Providers: BankingDealers.co.za  This test is not yet approved or  cleared by the [place] FDA and has been authorized for detection and/or diagnosis of SARS-CoV-2 by FDA under an Emergency Use Authorization (EUA).  This EUA will remain in effect (meaning this test can be used) for the duration of the COVID-19 declaration under Section 564(b)(1) of the Act, 21 U.S.C. section 360bbb-3(b)(1), unless the authorization is terminated or revoked sooner.  Performed at [facility], [place] [address], [place], [place] [address]     Radiology Reports DG Chest 1 View  Result Date: 07/11/2020 CLINICAL DATA:  Pain following fall EXAM: CHEST  1 VIEW COMPARISON:  November 22, 2017 FINDINGS: Central catheter tip is at the cavoatrial junction. No pneumothorax. There is slight left base atelectasis. Lungs elsewhere clear. Heart is upper normal in size with pulmonary vascularity normal. There is aortic atherosclerosis. No adenopathy. No bone lesions. There are surgical clips in  the right axilla. IMPRESSION: Central catheter tip at cavoatrial junction. No pneumothorax. Mild left base atelectasis. No edema or airspace opacity. Stable cardiac silhouette. Aortic Atherosclerosis (ICD10-I70.0). Electronically Signed   By: Lowella Grip III M.D.   On: 07/11/2020 15:14   DG Pelvis 1-2 Views  Result Date: 07/12/2020 CLINICAL DATA:  The patient suffered a right hip fracture in a fall 07/11/2020. Initial encounter. EXAM: PELVIS - 1-2 VIEW COMPARISON:  Plain films right hip 07/11/2020. FINDINGS: New intramedullary nail and hip screw are in place for fixation of a right hip fracture. Hardware is intact. The intramedullary nail is incompletely imaged. Position and alignment are near anatomic. No acute abnormality. IMPRESSION: Status post fixation right hip fracture.  No acute finding. Electronically Signed   By: Inge Rise M.D.   On: 07/12/2020 15:07   DG Knee 1-2 Views Right  Result Date: 07/11/2020 CLINICAL DATA:  Recent fall with right knee pain, initial encounter EXAM: RIGHT KNEE - 2 VIEW COMPARISON:  None. FINDINGS: No acute fracture or dislocation is noted. Diffuse vascular calcifications are seen. No soft tissue abnormality is noted. IMPRESSION: No acute abnormality noted. Electronically Signed   By: Inez Catalina M.D.   On: 07/11/2020 15:13   PERIPHERAL VASCULAR CATHETERIZATION  Result Date: 07/05/2020 Procedure: Right arm fistulogram, angioplasty right subclavian innominate vein superior vena cava Preoperative diagnosis: Venous hypertension right arm Postoperative diagnosis: Same Anesthesia: Local Operative findings: 1.  80% stenosis right subclavian and innominate and superior vena cava angioplastied up to a 10 mm balloon with complete elastic recoil and no significant improvement Operative details: After obtaining informed consent, the patient was taken the PV lab.  The patient was placed in supine position the angio table.  Patient's right upper extremities prepped and  draped in usual sterile fashion.  Local anesthesia was infiltrated over the right upper arm AV fistula.  Micropuncture needle was used to cannulate the right arm AV fistula under ultrasound guidance.  Micropuncture wire was advanced into the fistula.  Micropuncture sheath was placed over this.  Right arm fistulogram and central venogram was then performed through  the sheath.  The superior vena cava innominate vein and subclavian veins on the right side are all patent.  However there were multiple segments areas of narrowing up to 80%.  There are abundant collaterals around the shoulder.  The remainder of the AV fistula is widely patent.  Compression on the outflow to reflux contrast across the arterial anastomosis showed this was widely patent as well. At this point the sheath was accessed with an 035 versa core wire and advanced across the central vein stenosis into the right atrium.  This was assisted with a KMP catheter.  A 7 French sheath was then placed over the wire.  Patient was given 3000 units of intravenous heparin.  I then angioplastied 3 discrete lesions in the right subclavian right innominate and superior vena cava utilizing multiple inflations of a 10 x 40 balloon.  Due to the recoil nature of the vein the balloon Sliding forward.  Therefore I swapped this out for a 1080 balloon.  This was then inflated to 8 atm for 1 minute.  Completion angiogram really showed no significant improvement and complete Recoil of the narrowing.  A similar procedure had been performed approximately 30 days ago.  In light of this I felt my options were to either stent her central vein potentially encroaching upon the left innominate system and jailing the right internal jugular vein or abandoning this in favor of placing a thigh graft in the near future.  I opted not to stent the central vein segment. At this point the guidewire was removed the sheath was removed and the fistula opening closed with a single figure-of-eight  Monocryl stitch. Operative management: The patient has had previous access procedures which failed in the left arm.  She now has venous hypertension and multiple areas of narrowing in the right innominate superior vena cava on the right side.  I do not believe we are going to obtain significant durability of her right arm fistula even if we stent this segment.  We will schedule the patient for outpatient follow-up for consideration of a thigh graft. Ruta Hinds, MD [facility] Office: [phone]   VAS [place] (AVF,AVG)  Result Date: 06/20/2020 DIALYSIS ACCESS Reason for Exam: Routine follow up. Access Site: Right Upper Extremity. Access Type: Brachial-cephalic AVF placed 06/05/760. History: History of left brachiocephalic AVF. Performing Technologist: Alvia Grove RVT  Examination Guidelines: A complete evaluation includes B-mode imaging, spectral Doppler, color Doppler, and power Doppler as needed of all accessible portions of each vessel. Unilateral testing is considered an integral part of a complete examination. Limited examinations for reoccurring indications may be performed as noted.  Findings: +--------------------+----------+-----------------+--------+ AVF                 PSV (cm/s)Flow Vol (mL/min)Comments +--------------------+----------+-----------------+--------+ Native artery inflow   161           901                +--------------------+----------+-----------------+--------+ AVF Anastomosis        327                              +--------------------+----------+-----------------+--------+  +------------+----------+-------------+-----------+----------------------------+ OUTFLOW VEINPSV (cm/s)Diameter (cm)Depth (cm)           Describe           +------------+----------+-------------+-----------+----------------------------+ Prox UA         79  0.58        0.86    competing branch 41cm/s 0.30                                                           cm dia            +------------+----------+-------------+-----------+----------------------------+ Mid UA         127        0.59        1.02                                 +------------+----------+-------------+-----------+----------------------------+ Dist UA     138 / 154     0.62        0.53     tortuous, slight narrowing  +------------+----------+-------------+-----------+----------------------------+ AC Fossa    161 / 634  1.08 / 0.31 0.26 / 0.57 sharp curve into brachial   +------------+----------+-------------+-----------+----------------------------+   Summary: Patent arteriovenous fistula with sharp curve in the antecubital fossa entering Brachial artery with increased velocity. . *See table(s) above for measurements and observations.  Diagnosing physician: Ruta Hinds MD Electronically signed by Ruta Hinds MD on 06/20/2020 at 2:20:22 PM.    --------------------------------------------------------------------------------   Final    DG HIP OPERATIVE UNILAT WITH PELVIS RIGHT  Result Date: 07/12/2020 CLINICAL DATA:  Hip fracture EXAM: OPERATIVE right HIP (WITH PELVIS IF PERFORMED) 14 VIEWS TECHNIQUE: Fluoroscopic spot image(s) were submitted for interpretation post-operatively. COMPARISON:  07/11/2020 FINDINGS: Fourteen low resolution intraoperative spot views of the right hip. Total fluoroscopy time was 1 minutes 50 seconds. The images demonstrate basicervical/intertrochanteric fracture with subsequent intramedullary rodding and distal screw fixation with anatomic alignment. IMPRESSION: Intraoperative fluoroscopic assistance provided during surgical fixation of proximal right femur fracture. Electronically Signed   By: Donavan Foil M.D.   On: 07/12/2020 16:02   DG Hip Unilat  With Pelvis 2-3 Views Right  Result Date: 07/11/2020 CLINICAL DATA:  Fall, right hip deformity EXAM: DG HIP (WITH OR WITHOUT PELVIS) 2-3V RIGHT COMPARISON:  None.  FINDINGS: Single view radiograph pelvis and two view radiograph of the right hip demonstrates an acute, mildly impacted, angulated basicervical right femoral neck fracture with mild posterior and moderate varus angulation. The femoral head is still seated within the right acetabulum. Mild-to-moderate degenerative hip arthritis is present with joint space narrowing noted bilaterally. The pelvis and left hip are intact. Multiple involuted fibroids are seen within the pelvis. Advanced vascular calcifications are seen within the pelvis and medial thighs. IMPRESSION: Acute, impacted, angulated right basicervical femoral neck fracture. Electronically Signed   By: Fidela Salisbury MD   On: 07/11/2020 15:14   DG FEMUR PORT, MIN 2 VIEWS RIGHT  Result Date: 07/12/2020 CLINICAL DATA:  Postop femoral ORIF. EXAM: RIGHT FEMUR PORTABLE 2 VIEW COMPARISON:  Intraoperative views earlier the same date. Radiographs 07/11/2020. FINDINGS: Status post right femoral dynamic screw and intramedullary fixation of the comminuted intertrochanteric femur fracture. There is near anatomic reduction of the fracture. The hardware is well positioned. Lateral skin staples are present superior to the greater trochanter, and there is soft tissue emphysema laterally in the proximal to mid right thigh. No complications identified. Calcified uterine fibroids and femoral atherosclerosis are noted. IMPRESSION: Near anatomic reduction of intertrochanteric right femur fracture  post ORIF. Electronically Signed   By: Richardean Sale M.D.   On: 07/12/2020 15:06     CBC Recent Labs  Lab 07/11/20 1642 07/12/20 0905 07/13/20 0506  WBC 9.1 10.6* 11.4*  HGB 10.6* 10.8* 8.9*  HCT 34.6* 34.8* 29.7*  PLT 248 297 303  MCV 109.1* 107.7* 108.8*  MCH 33.4 33.4 32.6  MCHC 30.6 31.0 30.0  RDW 15.9* 15.9* 16.1*  LYMPHSABS 1.0  --   --   MONOABS 1.1*  --   --   EOSABS 0.2  --   --   BASOSABS 0.0  --   --     Chemistries  Recent Labs  Lab 07/11/20 1602  07/11/20 1750 07/12/20 0905 07/13/20 0915  NA 134*  --  135 135  K 4.9  --  4.9 6.3*  CL 93*  --  93* 96*  CO2 30  --  27 22  GLUCOSE 79  --  147* 178*  BUN 45*  --  53* 66*  CREATININE 7.71*  --  9.68* 11.17*  CALCIUM 8.1*  --  8.5* 8.2*  MG  --  2.4  --   --   AST 33  --   --   --   ALT 15  --   --   --   ALKPHOS 68  --   --   --   BILITOT 0.7  --   --   --    ------------------------------------------------------------------------------------------------------------------ No results for input(s): CHOL, HDL, LDLCALC, TRIG, CHOLHDL, LDLDIRECT in the last 72 hours.  Lab Results  Component Value Date   HGBA1C 6.2 (H) 06/13/2020   ------------------------------------------------------------------------------------------------------------------ No results for input(s): TSH, T4TOTAL, T3FREE, THYROIDAB in the last 72 hours.  Invalid input(s): FREET3 ------------------------------------------------------------------------------------------------------------------ No results for input(s): VITAMINB12, FOLATE, FERRITIN, TIBC, IRON, RETICCTPCT in the last 72 hours.  Coagulation profile No results for input(s): INR, PROTIME in the last 168 hours.  No results for input(s): DDIMER in the last 72 hours.  Cardiac Enzymes No results for input(s): CKMB, TROPONINI, MYOGLOBIN in the last 168 hours.  Invalid input(s): CK ------------------------------------------------------------------------------------------------------------------ No results found for: BNP   Roxan Hockey M.D on 07/13/2020 at 1:45 PM  Go to [email or web address] - for contact info  [facility] - Office  [phone]

## 2020-07-13 NOTE — Progress Notes (Signed)
Has been alert and making appropriate requests such as asking for someone to open milk and replying that her leg is hurting when asked.  States pain at a 10 before pain meds.  Oriented to name but thinks she is outside and needs a hat and that she needs to lie down even though she is in bed.  Dressings to right hip dry and intact.  Transported on bed to 2nd floor dialysis.

## 2020-07-13 NOTE — Progress Notes (Addendum)
Date and time results received: 07/13/20 10:29 AM   (use smartphrase ".now" to insert current time)  Test: Potassium Critical Value: 6.3  Name of Provider Notified: Dr. Roxan Hockey Orders Received? Or Actions Taken?: Actions Taken: Awaiting orders  Dialysis scheduled for today

## 2020-07-13 NOTE — Progress Notes (Signed)
   07/13/20 1451  Assess: MEWS Score  Pulse Rate (!) 108  SpO2 96 %  O2 Device Nasal Cannula  O2 Flow Rate (L/min) 2 L/min  Assess: MEWS Score  MEWS Temp 0  MEWS Systolic 0  MEWS Pulse 1  MEWS RR 0  MEWS LOC 0  MEWS Score 1  MEWS Score Color Green  Treat  Pain Score 8  Pain Type Surgical pain  Document  Patient Outcome Stabilized after interventions  Progress note created (see row info) Yes

## 2020-07-13 NOTE — Progress Notes (Addendum)
-  Patient with worsening sinus tachycardia during hemodialysis session -EKG reviewed, consistent with sinus tach -Patient's hemoglobin is 8.9 which is not far from a baseline hemoglobin on 06/04/2020 was 9.6, hgb on 07/03/2027 was 10.1 --BP stable -Patient complains of spinning/vertigo--okay to use meclizine -Repeat CBC in a.m. BP (!) 132/59   Pulse (!) 135   Temp 98.1 F (36.7 C) (Oral)   Resp (!) 26   Ht 5\' 3"  (1.6 m)   Wt 65.6 kg   SpO2 98%   BMI 25.62 kg/m   Roxan Hockey, MD

## 2020-07-14 ENCOUNTER — Inpatient Hospital Stay (HOSPITAL_COMMUNITY): Payer: Medicare PPO

## 2020-07-14 DIAGNOSIS — D631 Anemia in chronic kidney disease: Secondary | ICD-10-CM

## 2020-07-14 LAB — GLUCOSE, CAPILLARY
Glucose-Capillary: 143 mg/dL — ABNORMAL HIGH (ref 70–99)
Glucose-Capillary: 160 mg/dL — ABNORMAL HIGH (ref 70–99)
Glucose-Capillary: 162 mg/dL — ABNORMAL HIGH (ref 70–99)
Glucose-Capillary: 174 mg/dL — ABNORMAL HIGH (ref 70–99)

## 2020-07-14 LAB — CBC
HCT: 25.5 % — ABNORMAL LOW (ref 36.0–46.0)
Hemoglobin: 7.8 g/dL — ABNORMAL LOW (ref 12.0–15.0)
MCH: 32.8 pg (ref 26.0–34.0)
MCHC: 30.6 g/dL (ref 30.0–36.0)
MCV: 107.1 fL — ABNORMAL HIGH (ref 80.0–100.0)
Platelets: 247 10*3/uL (ref 150–400)
RBC: 2.38 MIL/uL — ABNORMAL LOW (ref 3.87–5.11)
RDW: 15.9 % — ABNORMAL HIGH (ref 11.5–15.5)
WBC: 10.7 10*3/uL — ABNORMAL HIGH (ref 4.0–10.5)
nRBC: 0 % (ref 0.0–0.2)

## 2020-07-14 MED ORDER — ATENOLOL 25 MG PO TABS
25.0000 mg | ORAL_TABLET | Freq: Every day | ORAL | Status: DC
  Administered 2020-07-14 – 2020-07-17 (×4): 25 mg via ORAL
  Filled 2020-07-14 (×4): qty 1

## 2020-07-14 NOTE — TOC Initial Note (Signed)
Transition of Care [facility]) - Initial/Assessment Note    Patient Details  Name: Carly Wood MRN: [id number] Date of Birth: 1935/05/26  Transition of Care [facility]) CM/SW Contact:    Natasha Bence, LCSW Phone Number: 07/14/2020, 5:33 PM  Clinical Narrative:                 Patient is an 85 year old female admitted for hip fracture. CSW completed Initial assessment. CSW observed high readmission risk score.  CSW conducted readmission risk assesment. CCSW contacted patient's sister to inquire about agreeableness to SNF. Patient's sister reported that patient is a resident at [facility] and would like to Rehab with [facility]. CSW faxed SNF referral for [facility] and started auth. TOC to follow.    Expected Discharge Plan: Skilled Nursing Facility Barriers to Discharge: Continued Medical Work up   Patient Goals and CMS Choice Patient states their goals for this hospitalization and ongoing recovery are:: Rehab with SNF CMS Medicare.gov Compare Post Acute Care list provided to:: Patient Choice offered to / list presented to : Patient  Expected Discharge Plan and Services Expected Discharge Plan: [place] In-house Referral: NA Discharge Planning Services: NA Post Acute Care Choice: NA Living arrangements for the past 2 months: [place]                 DME Arranged: N/A DME Agency: NA       HH Arranged: NA [place] Agency: NA        Prior Living Arrangements/Services Living arrangements for the past 2 months: [place] Lives with:: Self Patient language and need for interpreter reviewed:: Yes Do you feel safe going back to the place where you live?: Yes      Need for Family Participation in Patient Care: Yes (Comment) Care giver support system in place?: Yes (comment)   Criminal Activity/Legal Involvement Pertinent to Current Situation/Hospitalization: No - Comment as needed  Activities of Daily Living      Permission  Sought/Granted Permission sought to share information with : Family Supports Permission granted to share information with : Yes, Verbal Permission Granted  Share Information with NAME: Adryanna, Friedt (Sister)   [phone]  Permission granted to share info w AGENCY: [place] granted to share info w Relationship: (Sister)  Permission granted to share info w Contact Information: [phone]  Emotional Assessment         Alcohol / Substance Use: Not Applicable Psych Involvement: No (comment)  Admission diagnosis:  Fall [W19.XXXA] Closed right hip fracture ([place]) [S72.001A] Closed fracture of right hip, initial encounter [facility]) [S72.001A] Patient Active Problem List   Diagnosis Date Noted  . ESRD (end stage renal disease) ([place]) 07/13/2020  . Closed right hip fracture ([place]) 07/11/2020  . Hyperlipidemia associated with type 2 diabetes mellitus ([place]) 06/19/2020  . Aortic atherosclerosis ([place]) 04/01/2020  . Atherosclerotic peripheral vascular disease ([place]) 04/01/2020  . Acute pain of left knee 01/12/2020  . Hyperkalemia 12/25/2019  . Dependence on renal dialysis ([place]) 10/25/2019  . Chronic kidney disease with end stage renal disease on dialysis due to type 2 diabetes mellitus ([place]) 10/19/2019  . Hypertension due to end stage renal disease caused by type 2 diabetes mellitus, on dialysis ([place]) 10/19/2019  . Anemia due to end stage renal disease ([place]) 10/19/2019  . Herpes 10/19/2019  . Vascular dementia with behavior disturbance ([place]) 10/19/2019  . Diabetes mellitus with ESRD (end-stage renal disease) ([place]) 10/12/2019  . Multiple myeloma ([place]) 10/04/2018  .  Medicare annual wellness visit, subsequent 10/04/2018  . MGUS (monoclonal gammopathy of unknown significance) 09/20/2018  . Iron deficiency anemia 05/03/2017  . Multiple gastric ulcers 11/10/2016  . Dysphagia 07/31/2016  . GERD (gastroesophageal reflux disease) 07/31/2016  . Abnormal weight loss 07/31/2016  . Aromatase  inhibitor use 08/22/2015  . Lymphedema of arm 08/22/2015  . Status post right mastectomy 08/22/2015  . Osteopenia determined by x-ray 08/22/2015  . Stage 1 infiltrating ductal carcinoma of right female breast ([place]) 08/21/2015   PCP:  Gerlene Fee, NP Pharmacy:   [place], [place] [address] Phone: [phone] Fax: [phone]     Social Determinants of Health (SDOH) Interventions    Readmission Risk Interventions Readmission Risk Prevention Plan 07/14/2020  Transportation Screening Complete  Medication Review Press photographer) Complete  PCP or Specialist appointment within 3-5 days of discharge Complete  HRI or Home Care Consult Complete  SW Recovery Care/Counseling Consult Complete  Palliative Care Screening Not [place] Complete  Some recent data might be hidden

## 2020-07-14 NOTE — Evaluation (Signed)
Physical Therapy Evaluation Patient Details Name: Carly Wood MRN: [id number] DOB: 09/30/1935 Today's Date: 07/14/2020   History of Present Illness  Carly Wood is a 85 y.o. female with medical history significant for multiple myeloma, diabetes mellitus, ESRD, dementia, hypertension gastric ulcer.  Patient presented to the ED from [facility] facility with reports of fall.  Patient is awake alert and oriented and able to give me history.  She reports she was walking she turned around, and lost her balance and fell.  She reports right knee pain.  Patient did not lose consciousness, she did not hit her head. Rt Hip Fx-- Hip Xray on Admission shows impacted right basicervical femoral neck fracture.  S/p Cephalomedullary nail for Right basicervical intertrochanteric femur fracture by Dr Amedeo Kinsman on 07/12/20  Clinical Impression  Patient present for PT in room and agreeable to Eval. Increased difficulty keeping eyes open throughout session and increased difficulty with responding to questions. Increased pain and decreased ability to assist in RLE movement to transition to sitting EOB. Attempt sit to stand x3 with MaxA from PT, but unable to transition to stand and demo decreased quad activation throughout. RN notified of mobility status. Patient will benefit from continued physical therapy in hospital and recommended venue below to increase strength, balance, endurance for safe ADLs and gait.      Follow Up Recommendations SNF    Equipment Recommendations  None recommended by PT    Recommendations for Other Services       Precautions / Restrictions Precautions Precautions: Fall Restrictions Weight Bearing Restrictions: Yes RLE Weight Bearing: Weight bearing as tolerated      Mobility  Bed Mobility Overal bed mobility: Needs Assistance Bed Mobility: Supine to Sit;Sit to Supine   Sidelying to sit: Max assist;Mod assist Supine to sit: Max assist;Mod assist Sit to supine: Max assist;Mod  assist   General bed mobility comments: slow and labored, PT required to move RLE.    Transfers Overall transfer level: Needs assistance Equipment used: Rolling walker (2 wheeled) Transfers: Sit to/from Stand Sit to Stand: Max assist         General transfer comment: attempted x4 sit to/from stand but unable to complete.  Ambulation/Gait                Stairs            Wheelchair Mobility    Modified Rankin (Stroke Patients Only)       Balance Overall balance assessment: Needs assistance Sitting-balance support: Bilateral upper extremity supported Sitting balance-Leahy Scale: Poor Sitting balance - Comments: require B UE to remain upright                                     Pertinent Vitals/Pain Pain Assessment: Faces Faces Pain Scale: Hurts even more Pain Location: hip with movement Pain Intervention(s): Limited activity within patient's tolerance;Monitored during session;Repositioned    Home Living Family/patient expects to be discharged to:: Skilled nursing facility                 Additional Comments: Increased difficulty communicating living to PT, stating she lives at home, chart states she comes from SNF.    Prior Function                 Hand Dominance        Extremity/Trunk Assessment   Upper Extremity Assessment Upper Extremity Assessment: Generalized weakness  Lower Extremity Assessment Lower Extremity Assessment: Generalized weakness    Cervical / Trunk Assessment Cervical / Trunk Assessment: Kyphotic  Communication      Cognition Arousal/Alertness: Awake/alert   Overall Cognitive Status: No family/caregiver present to determine baseline cognitive functioning                                 General Comments: repeated questions as to what happened and why her leg hurt      General Comments      Exercises     Assessment/Plan    PT Assessment Patient needs continued PT  services  PT Problem List Decreased strength;Decreased range of motion;Decreased knowledge of use of DME;Decreased activity tolerance;Decreased safety awareness;Decreased balance;Decreased knowledge of precautions;Decreased mobility;Decreased coordination       PT Treatment Interventions DME instruction;Balance training;Gait training;Neuromuscular re-education;Functional mobility training;Patient/family education;Therapeutic activities;Therapeutic exercise    PT Goals (Current goals can be found in the Care Plan section)  Acute Rehab PT Goals Patient Stated Goal: return to SNF PT Goal Formulation: With patient Time For Goal Achievement: 07/28/20 Potential to Achieve Goals: Good    Frequency Min 4X/week   Barriers to discharge        Co-evaluation               AM-PAC PT "6 Clicks" Mobility  Outcome Measure Help needed turning from your back to your side while in a flat bed without using bedrails?: A Lot Help needed moving from lying on your back to sitting on the side of a flat bed without using bedrails?: A Lot Help needed moving to and from a bed to a chair (including a wheelchair)?: Total Help needed standing up from a chair using your arms (e.g., wheelchair or bedside chair)?: Total Help needed to walk in hospital room?: Total Help needed climbing 3-5 steps with a railing? : Total 6 Click Score: 8    End of Session   Activity Tolerance: Patient tolerated treatment well;Patient limited by fatigue;Patient limited by pain Patient left: in bed;with call bell/phone within reach Nurse Communication: Mobility status PT Visit Diagnosis: Unsteadiness on feet (R26.81);Other abnormalities of gait and mobility (R26.89);Muscle weakness (generalized) (M62.81)    Time: 7340-3709 PT Time Calculation (min) (ACUTE ONLY): 25 min   Charges:   PT Evaluation $PT Eval Low Complexity: 1 Low PT Treatments $Therapeutic Activity: 8-22 mins       4:46 PM,07/14/20 Domenic Moras, PT,  DPT Physical Therapist at [facility]

## 2020-07-14 NOTE — Progress Notes (Signed)
Patient Demographics:    Carly Wood, is a 85 y.o. female, DOB - 1935/05/27, PZP:[id number]  Admit date - 07/11/2020   Admitting Physician Ejiroghene Arlyce Dice, MD  Outpatient Primary MD for the patient is Carly Wood [name], NP  LOS - 3   Chief Complaint  Patient presents with  . Fall        Subjective:    [name] today has no fevers, no emesis,  No chest pain,  C/o Rt Hip pain  -Remains intermittently confused and disoriented -Anxiety and vertigo is improved -  Assessment  & Plan :    Principal Problem:   Closed right hip fracture (HCC) Active Problems:   ESRD (end stage renal disease) (HCC)   Stage 1 infiltrating ductal carcinoma of right female breast (HCC)   Multiple myeloma (HCC)   Diabetes mellitus with ESRD (end-stage renal disease) ([place])   Anemia due to end stage renal disease ([place])   Vascular dementia with behavior disturbance ([place])   Dependence on renal dialysis ([place])   Hyperkalemia  Brief Summary:- 85 y.o. female with medical history significant for multiple myeloma, diabetes mellitus, ESRD, dementia, hypertension gastric ulcer admitted with Rt Hip Fx post mechanical fall  A/p 1)Rt Hip Fx-- POA--- -Hip Xray on Admission shows impacted right basicervical femoral neck fracture. -S/p Cephalomedullary nail for Right basicervical intertrochanteric femur fracture by Dr Amedeo Kinsman on 07/12/20 Post-operative plan:  Weightbearing: The patient will be WBAT on the operative extremity.   DVT prophylaxis :- orthopedics Recommended 81 mg Aspirin BID,  Dressing can be reinforced as needed, will change on POD#2/3 if needed.  Patient does not need to remain hospitalized for dressing change Follow up plan: approximately 2 weeks postop for incision check and XR.  If the patient will be returning to a nursing facility, staples can be removed around this time and a follow up appointment can be  scheduled for 6 weeks after surgery. XR at next office visit:  please obtain AP pelvis, and 2 views of the Right hip and femur -await phy therapy eval, will need SNF Rehab  2)ESRD--M/W/F, last HD 07/10/20,  Plan is for HD on 07/13/20 HD as per Nephrology team  3)H/o Multiple Myeloma--- chemotherapy with Velcade and dexamethasone as per Dr. Delton Coombes  4)Breast cancer s/p right mastectomy. -c/n Anastrozole  5)Diabetes mellitus-- HgbA1c- 6.2 reflecting excellent diabetic control-- Use Novolog/Humalog Sliding scale insulin with Accu-Cheks/Fingersticks as ordered   6)HTN and sinus tachycardia-- c/n Amlodipine and clonidine -Okay to use atenolol 25 mg daily with parameters  7)HLD--- c/n Atorvastatin and Aspirin  8)Depression--- c/n zoloft 50 mg daily  9)HyperKalemia in an ESRD pt--- had on 07/13/2020 (did not complete session due to tachycardia) -Plan is for HD on 07/15/2020  Disposition/Need for in-Hospital Stay- patient unable to be discharged at this time due to-- will need SNF Rehab   Status is: Inpatient  Remains inpatient appropriate because:-await phy therapy eval, will need SNF Rehab  Disposition: The patient is from: Home              Anticipated d/c is to: SNF              Anticipated d/c date is: 2 days  Patient currently is not medically stable to d/c. Barriers: Not Clinically Stable-   Code Status :  -  Code Status: Full Code   Family Communication:  I called and updated her sister Ms Carly Wood [phone]   Consults  :  Ortho/nephrology  DVT Prophylaxis  :   - SCDs  SCDs Start: 07/11/20 2053  Lab Results  Component Value Date   PLT 247 07/14/2020    Inpatient Medications  Scheduled Meds: . acyclovir  200 mg Oral Daily  . amLODipine  10 mg Oral Once per day on Sun Tue Thu Sat  . anastrozole  1 mg Oral Daily  . aspirin EC  81 mg Oral BID  . atenolol  25 mg Oral Daily  . atorvastatin  10 mg Oral QHS  . calcium acetate  1,334 mg Oral BID  WC  . calcium-vitamin D  1 tablet Oral Daily  . Chlorhexidine Gluconate Cloth  6 each Topical Q0600  . cloNIDine  0.3 mg Oral BID  . insulin aspart  0-9 Units Subcutaneous Q6H  . melatonin  6 mg Oral QHS  . sertraline  50 mg Oral Daily   Continuous Infusions: . sodium chloride    . sodium chloride     PRN Meds:.sodium chloride, sodium chloride, acetaminophen **OR** acetaminophen, heparin, HYDROmorphone (DILAUDID) injection, LORazepam, meclizine, ondansetron **OR** ondansetron (ZOFRAN) IV, polyethylene glycol  Anti-infectives (From admission, onward)   Start     Dose/Rate Route Frequency Ordered Stop   07/12/20 1000  acyclovir (ZOVIRAX) 200 MG capsule 200 mg        200 mg Oral Daily 07/11/20 2052     07/12/20 0600  clindamycin (CLEOCIN) IVPB 900 mg        900 mg 100 mL/hr over 30 Minutes Intravenous On call to O.R. 07/11/20 2151 07/12/20 1217       Objective:   Vitals:   07/13/20 1956 07/13/20 2115 07/14/20 0006 07/14/20 0635  BP: 123/67  110/71 130/61  Pulse: (!) 128 (!) 120 98 (!) 103  Resp: 20   18  Temp:    99.3 F (37.4 C)  TempSrc:    Oral  SpO2: 97%   99%  Weight:      Height:        Wt Readings from Last 3 Encounters:  07/13/20 65.6 kg  07/05/20 67.1 kg  07/02/20 63.4 kg    Intake/Output Summary (Last 24 hours) at 07/14/2020 1047 Last data filed at 07/13/2020 1845 Gross per 24 hour  Intake 240 ml  Output 743 ml  Net -503 ml   Physical Exam  Gen:- Awake Alert,  In no apparent distress  HEENT:- [place].AT, No sclera icterus Neck-Supple Neck, HD catheter.  Lungs-fair air movement, no wheezing CV- S1, S2 normal, regular  Abd-  +ve B.Sounds, Abd Soft, No tenderness,    Extremity/Skin:- No  edema, pedal pulses present  Psych-affect is appropriate, oriented x3 Neuro-generalized weakness, no new focal deficits, no tremors MSK- Lt UE AVF noted, Rt Hip post op wound is dry/intact   Data Review:   Micro Results Recent Results (from the past 240 hour(s))  SARS  Coronavirus 2 by RT PCR (hospital order, performed in [facility] hospital lab) Nasopharyngeal Nasopharyngeal Swab     Status: None   Collection Time: 07/05/20  6:45 AM   Specimen: Nasopharyngeal Swab  Result Value Ref Range Status   SARS Coronavirus 2 NEGATIVE NEGATIVE Final    Comment: (NOTE) SARS-CoV-2 target nucleic acids are NOT DETECTED.  The SARS-CoV-2 RNA is generally detectable in upper and lower respiratory specimens during the acute phase of infection. The lowest concentration of SARS-CoV-2 viral copies this assay can detect is 250 copies / mL. A negative result does not preclude SARS-CoV-2 infection and should not be used as the sole basis for treatment or other patient management decisions.  A negative result may occur with improper specimen collection / handling, submission of specimen other than nasopharyngeal swab, presence of viral mutation(s) within the areas targeted by this assay, and inadequate number of viral copies (<250 copies / mL). A negative result must be combined with clinical observations, patient history, and epidemiological information.  Fact Sheet for Patients:   StrictlyIdeas.no  Fact Sheet for Healthcare Providers: BankingDealers.co.za  This test is not yet approved or  cleared by the [place] FDA and has been authorized for detection and/or diagnosis of SARS-CoV-2 by FDA under an Emergency Use Authorization (EUA).  This EUA will remain in effect (meaning this test can be used) for the duration of the COVID-19 declaration under Section 564(b)(1) of the Act, 21 U.S.C. section 360bbb-3(b)(1), unless the authorization is terminated or revoked sooner.  Performed at [facility], [place] [address] [place],  [address]     Radiology Reports DG Chest 1 View  Result Date: 07/11/2020 CLINICAL DATA:  Pain following fall EXAM: CHEST  1 VIEW COMPARISON:  November 22, 2017 FINDINGS: Central  catheter tip is at the cavoatrial junction. No pneumothorax. There is slight left base atelectasis. Lungs elsewhere clear. Heart is upper normal in size with pulmonary vascularity normal. There is aortic atherosclerosis. No adenopathy. No bone lesions. There are surgical clips in the right axilla. IMPRESSION: Central catheter tip at cavoatrial junction. No pneumothorax. Mild left base atelectasis. No edema or airspace opacity. Stable cardiac silhouette. Aortic Atherosclerosis (ICD10-I70.0). Electronically Signed   By: Lowella Grip III M.D.   On: 07/11/2020 15:14   DG Pelvis 1-2 Views  Result Date: 07/12/2020 CLINICAL DATA:  The patient suffered a right hip fracture in a fall 07/11/2020. Initial encounter. EXAM: PELVIS - 1-2 VIEW COMPARISON:  Plain films right hip 07/11/2020. FINDINGS: New intramedullary nail and hip screw are in place for fixation of a right hip fracture. Hardware is intact. The intramedullary nail is incompletely imaged. Position and alignment are near anatomic. No acute abnormality. IMPRESSION: Status post fixation right hip fracture.  No acute finding. Electronically Signed   By: Inge Rise M.D.   On: 07/12/2020 15:07   DG Knee 1-2 Views Right  Result Date: 07/11/2020 CLINICAL DATA:  Recent fall with right knee pain, initial encounter EXAM: RIGHT KNEE - 2 VIEW COMPARISON:  None. FINDINGS: No acute fracture or dislocation is noted. Diffuse vascular calcifications are seen. No soft tissue abnormality is noted. IMPRESSION: No acute abnormality noted. Electronically Signed   By: Inez Catalina M.D.   On: 07/11/2020 15:13   PERIPHERAL VASCULAR CATHETERIZATION  Result Date: 07/05/2020 Procedure: Right arm fistulogram, angioplasty right subclavian innominate vein superior vena cava Preoperative diagnosis: Venous hypertension right arm Postoperative diagnosis: Same Anesthesia: Local Operative findings: 1.  80% stenosis right subclavian and innominate and superior vena cava  angioplastied up to a 10 mm balloon with complete elastic recoil and no significant improvement Operative details: After obtaining informed consent, the patient was taken the PV lab.  The patient was placed in supine position the angio table.  Patient's right upper extremities prepped and draped in usual sterile fashion.  Local anesthesia was infiltrated over  the right upper arm AV fistula.  Micropuncture needle was used to cannulate the right arm AV fistula under ultrasound guidance.  Micropuncture wire was advanced into the fistula.  Micropuncture sheath was placed over this.  Right arm fistulogram and central venogram was then performed through the sheath.  The superior vena cava innominate vein and subclavian veins on the right side are all patent.  However there were multiple segments areas of narrowing up to 80%.  There are abundant collaterals around the shoulder.  The remainder of the AV fistula is widely patent.  Compression on the outflow to reflux contrast across the arterial anastomosis showed this was widely patent as well. At this point the sheath was accessed with an 035 versa core wire and advanced across the central vein stenosis into the right atrium.  This was assisted with a KMP catheter.  A 7 French sheath was then placed over the wire.  Patient was given 3000 units of intravenous heparin.  I then angioplastied 3 discrete lesions in the right subclavian right innominate and superior vena cava utilizing multiple inflations of a 10 x 40 balloon.  Due to the recoil nature of the vein the balloon Sliding forward.  Therefore I swapped this out for a 1080 balloon.  This was then inflated to 8 atm for 1 minute.  Completion angiogram really showed no significant improvement and complete Recoil of the narrowing.  A similar procedure had been performed approximately 30 days ago.  In light of this I felt my options were to either stent her central vein potentially encroaching upon the left innominate  system and jailing the right internal jugular vein or abandoning this in favor of placing a thigh graft in the near future.  I opted not to stent the central vein segment. At this point the guidewire was removed the sheath was removed and the fistula opening closed with a single figure-of-eight Monocryl stitch. Operative management: The patient has had previous access procedures which failed in the left arm.  She now has venous hypertension and multiple areas of narrowing in the right innominate superior vena cava on the right side.  I do not believe we are going to obtain significant durability of her right arm fistula even if we stent this segment.  We will schedule the patient for outpatient follow-up for consideration of a thigh graft. Ruta Hinds, MD [facility] Office: [phone]   VAS [place] (AVF,AVG)  Result Date: 06/20/2020 DIALYSIS ACCESS Reason for Exam: Routine follow up. Access Site: Right Upper Extremity. Access Type: Brachial-cephalic AVF placed 0/98/1191. History: History of left brachiocephalic AVF. Performing Technologist: Alvia Grove RVT  Examination Guidelines: A complete evaluation includes B-mode imaging, spectral Doppler, color Doppler, and power Doppler as needed of all accessible portions of each vessel. Unilateral testing is considered an integral part of a complete examination. Limited examinations for reoccurring indications may be performed as noted.  Findings: +--------------------+----------+-----------------+--------+ AVF                 PSV (cm/s)Flow Vol (mL/min)Comments +--------------------+----------+-----------------+--------+ Native artery inflow   161           901                +--------------------+----------+-----------------+--------+ AVF Anastomosis        327                              +--------------------+----------+-----------------+--------+   +------------+----------+-------------+-----------+----------------------------+  OUTFLOW VEINPSV (cm/s)Diameter (cm)Depth (cm)           Describe           +------------+----------+-------------+-----------+----------------------------+ Prox UA         79        0.58        0.86    competing branch 41cm/s 0.30                                                          cm dia            +------------+----------+-------------+-----------+----------------------------+ Mid UA         127        0.59        1.02                                 +------------+----------+-------------+-----------+----------------------------+ Dist UA     138 / 154     0.62        0.53     tortuous, slight narrowing  +------------+----------+-------------+-----------+----------------------------+ AC Fossa    161 / 634  1.08 / 0.31 0.26 / 0.57 sharp curve into brachial   +------------+----------+-------------+-----------+----------------------------+   Summary: Patent arteriovenous fistula with sharp curve in the antecubital fossa entering Brachial artery with increased velocity. . *See table(s) above for measurements and observations.  Diagnosing physician: Ruta Hinds MD Electronically signed by Ruta Hinds MD on 06/20/2020 at 2:20:22 PM.    --------------------------------------------------------------------------------   Final    DG HIP OPERATIVE UNILAT WITH PELVIS RIGHT  Result Date: 07/12/2020 CLINICAL DATA:  Hip fracture EXAM: OPERATIVE right HIP (WITH PELVIS IF PERFORMED) 14 VIEWS TECHNIQUE: Fluoroscopic spot image(s) were submitted for interpretation post-operatively. COMPARISON:  07/11/2020 FINDINGS: Fourteen low resolution intraoperative spot views of the right hip. Total fluoroscopy time was 1 minutes 50 seconds. The images demonstrate basicervical/intertrochanteric fracture with subsequent intramedullary rodding and distal screw fixation with anatomic alignment. IMPRESSION: Intraoperative  fluoroscopic assistance provided during surgical fixation of proximal right femur fracture. Electronically Signed   By: Donavan Foil M.D.   On: 07/12/2020 16:02   DG Hip Unilat  With Pelvis 2-3 Views Right  Result Date: 07/11/2020 CLINICAL DATA:  Fall, right hip deformity EXAM: DG HIP (WITH OR WITHOUT PELVIS) 2-3V RIGHT COMPARISON:  None. FINDINGS: Single view radiograph pelvis and two view radiograph of the right hip demonstrates an acute, mildly impacted, angulated basicervical right femoral neck fracture with mild posterior and moderate varus angulation. The femoral head is still seated within the right acetabulum. Mild-to-moderate degenerative hip arthritis is present with joint space narrowing noted bilaterally. The pelvis and left hip are intact. Multiple involuted fibroids are seen within the pelvis. Advanced vascular calcifications are seen within the pelvis and medial thighs. IMPRESSION: Acute, impacted, angulated right basicervical femoral neck fracture. Electronically Signed   By: Fidela Salisbury MD   On: 07/11/2020 15:14   DG FEMUR PORT, MIN 2 VIEWS RIGHT  Result Date: 07/12/2020 CLINICAL DATA:  Postop femoral ORIF. EXAM: RIGHT FEMUR PORTABLE 2 VIEW COMPARISON:  Intraoperative views earlier the same date. Radiographs 07/11/2020. FINDINGS: Status post right femoral dynamic screw and intramedullary fixation of the comminuted intertrochanteric femur fracture. There is near anatomic reduction of the fracture. The hardware is well positioned.  Lateral skin staples are present superior to the greater trochanter, and there is soft tissue emphysema laterally in the proximal to mid right thigh. No complications identified. Calcified uterine fibroids and femoral atherosclerosis are noted. IMPRESSION: Near anatomic reduction of intertrochanteric right femur fracture post ORIF. Electronically Signed   By: Richardean Sale M.D.   On: 07/12/2020 15:06     CBC Recent Labs  Lab 07/11/20 1642 07/12/20 0905  07/13/20 0506 07/14/20 0413  WBC 9.1 10.6* 11.4* 10.7*  HGB 10.6* 10.8* 8.9* 7.8*  HCT 34.6* 34.8* 29.7* 25.5*  PLT 248 297 303 247  MCV 109.1* 107.7* 108.8* 107.1*  MCH 33.4 33.4 32.6 32.8  MCHC 30.6 31.0 30.0 30.6  RDW 15.9* 15.9* 16.1* 15.9*  LYMPHSABS 1.0  --   --   --   MONOABS 1.1*  --   --   --   EOSABS 0.2  --   --   --   BASOSABS 0.0  --   --   --     Chemistries  Recent Labs  Lab 07/11/20 1602 07/11/20 1750 07/12/20 0905 07/13/20 0915  NA 134*  --  135 135  K 4.9  --  4.9 6.3*  CL 93*  --  93* 96*  CO2 30  --  27 22  GLUCOSE 79  --  147* 178*  BUN 45*  --  53* 66*  CREATININE 7.71*  --  9.68* 11.17*  CALCIUM 8.1*  --  8.5* 8.2*  MG  --  2.4  --   --   AST 33  --   --   --   ALT 15  --   --   --   ALKPHOS 68  --   --   --   BILITOT 0.7  --   --   --    ------------------------------------------------------------------------------------------------------------------ No results for input(s): CHOL, HDL, LDLCALC, TRIG, CHOLHDL, LDLDIRECT in the last 72 hours.  Lab Results  Component Value Date   HGBA1C 6.2 (H) 06/13/2020   ------------------------------------------------------------------------------------------------------------------ No results for input(s): TSH, T4TOTAL, T3FREE, THYROIDAB in the last 72 hours.  Invalid input(s): FREET3 ------------------------------------------------------------------------------------------------------------------ No results for input(s): VITAMINB12, FOLATE, FERRITIN, TIBC, IRON, RETICCTPCT in the last 72 hours.  Coagulation profile No results for input(s): INR, PROTIME in the last 168 hours.  No results for input(s): DDIMER in the last 72 hours.  Cardiac Enzymes No results for input(s): CKMB, TROPONINI, MYOGLOBIN in the last 168 hours.  Invalid input(s): CK ------------------------------------------------------------------------------------------------------------------ No results found for: BNP   Roxan Hockey M.D on 07/14/2020 at 10:47 AM  Go to [email or web address] - for contact info  [facility] - Office  [phone]

## 2020-07-14 NOTE — Plan of Care (Signed)
  Problem: Acute Rehab PT Goals(only PT should resolve) Goal: Pt will Roll Supine to Side Outcome: Progressing Flowsheets (Taken 07/14/2020 1646) Pt will Roll Supine to Side: with min assist Goal: Pt Will Go Supine/Side To Sit Outcome: Progressing Flowsheets (Taken 07/14/2020 1646) Pt will go Supine/Side to Sit: with minimal assist Goal: Patient Will Transfer Sit To/From Stand Outcome: Progressing Flowsheets (Taken 07/14/2020 1646) Patient will transfer sit to/from stand: with minimal assist Goal: Pt Will Transfer Bed To Chair/Chair To Bed Outcome: Progressing Flowsheets (Taken 07/14/2020 1646) Pt will Transfer Bed to Chair/Chair to Bed: with min assist Goal: Pt Will Ambulate Outcome: Progressing Flowsheets (Taken 07/14/2020 1646) Pt will Ambulate:  25 feet  with minimal assist  with least restrictive assistive device  4:47 PM,07/14/20 Domenic Moras, PT, DPT Physical Therapist at [facility]

## 2020-07-14 NOTE — NC FL2 (Signed)
[place] MEDICAID FL2 LEVEL OF CARE SCREENING TOOL     IDENTIFICATION  Patient Name: Carly Wood Birthdate: Jul 18, 1935 Sex: female Admission Date (Current Location): 07/11/2020  [facility] and [place] Number:  [employer] and Address:  [facility] [address], [place]      Provider Number: [id number]  Attending Physician Name and Address:  Roxan Hockey, MD  Relative Name and Phone Number:  Aslynn, Brunetti (Sister)   [phone]    Current Level of Care: Hospital Recommended Level of Care: [place] Prior Approval Number:    Date Approved/Denied: 10/07/19 PASRR Number: [id number]  Discharge Plan: SNF    Current Diagnoses: Patient Active Problem List   Diagnosis Date Noted  . ESRD (end stage renal disease) ([place]) 07/13/2020  . Closed right hip fracture ([place]) 07/11/2020  . Hyperlipidemia associated with type 2 diabetes mellitus ([place]) 06/19/2020  . Aortic atherosclerosis ([place]) 04/01/2020  . Atherosclerotic peripheral vascular disease ([place]) 04/01/2020  . Acute pain of left knee 01/12/2020  . Hyperkalemia 12/25/2019  . Dependence on renal dialysis ([place]) 10/25/2019  . Chronic kidney disease with end stage renal disease on dialysis due to type 2 diabetes mellitus ([place]) 10/19/2019  . Hypertension due to end stage renal disease caused by type 2 diabetes mellitus, on dialysis ([place]) 10/19/2019  . Anemia due to end stage renal disease ([place]) 10/19/2019  . Herpes 10/19/2019  . Vascular dementia with behavior disturbance ([place]) 10/19/2019  . Diabetes mellitus with ESRD (end-stage renal disease) ([place]) 10/12/2019  . Multiple myeloma ([place]) 10/04/2018  . Medicare annual wellness visit, subsequent 10/04/2018  . MGUS (monoclonal gammopathy of unknown significance) 09/20/2018  . Iron deficiency anemia 05/03/2017  . Multiple gastric ulcers 11/10/2016  . Dysphagia 07/31/2016  . GERD (gastroesophageal reflux disease) 07/31/2016  .  Abnormal weight loss 07/31/2016  . Aromatase inhibitor use 08/22/2015  . Lymphedema of arm 08/22/2015  . Status post right mastectomy 08/22/2015  . Osteopenia determined by x-ray 08/22/2015  . Stage 1 infiltrating ductal carcinoma of right female breast ([place]) 08/21/2015    Orientation RESPIRATION BLADDER Height & Weight     Self  O2 (2L) Continent Weight: 144 lb 10 oz (65.6 kg) Height:  '5\' 3"'  (160 cm)  BEHAVIORAL SYMPTOMS/MOOD NEUROLOGICAL BOWEL NUTRITION STATUS      Continent Diet (Diet renal/carb modified with fluid restriction Diet-HS Snack? Nothing; Fluid restriction: 1200 mL Fluid; Room service appropriate? Yes; Fluid consistency: Thin)  AMBULATORY STATUS COMMUNICATION OF NEEDS Skin   Extensive Assist Verbally Surgical wounds (Right hip)                       Personal Care Assistance Level of Assistance  Bathing,Feeding,Dressing Bathing Assistance: Maximum assistance Feeding assistance: Independent Dressing Assistance: Maximum assistance     Functional Limitations Info  Sight,Hearing,Speech Sight Info: Impaired Hearing Info: Adequate Speech Info: Adequate    SPECIAL CARE FACTORS FREQUENCY  PT (By licensed PT)     PT Frequency: 5x              Contractures Contractures Info: Not present    Additional Factors Info  Code Status,Allergies Code Status Info: Full Allergies Info: Ace inhibitor, Penicillin           Current Medications (07/14/2020):  This is the current hospital active medication list Current Facility-Administered Medications  Medication Dose Route Frequency Provider Last Rate Last Admin  . 0.9 %  sodium chloride infusion  100 mL Intravenous PRN Royce Macadamia,  Marlane Hatcher, MD      . 0.9 %  sodium chloride infusion  100 mL Intravenous PRN Claudia Desanctis, MD      . acetaminophen (TYLENOL) tablet 650 mg  650 mg Oral Q6H PRN Emokpae, Ejiroghene E, MD       Or  . acetaminophen (TYLENOL) suppository 650 mg  650 mg Rectal Q6H PRN Emokpae, Ejiroghene E, MD       . acyclovir (ZOVIRAX) 200 MG capsule 200 mg  200 mg Oral Daily Emokpae, Ejiroghene E, MD   200 mg at 07/14/20 0849  . amLODipine (NORVASC) tablet 10 mg  10 mg Oral Once per day on Sun Tue Thu Sat Bethena Roys, MD   10 mg at 07/14/20 0855  . anastrozole (ARIMIDEX) tablet 1 mg  1 mg Oral Daily Emokpae, Ejiroghene E, MD   1 mg at 07/14/20 0856  . aspirin EC tablet 81 mg  81 mg Oral BID Roxan Hockey, MD   81 mg at 07/14/20 0849  . atenolol (TENORMIN) tablet 25 mg  25 mg Oral Daily Emokpae, Courage, MD      . atorvastatin (LIPITOR) tablet 10 mg  10 mg Oral QHS Emokpae, Ejiroghene E, MD   10 mg at 07/13/20 2035  . calcium acetate (PHOSLO) capsule 1,334 mg  1,334 mg Oral BID WC Emokpae, Ejiroghene E, MD   1,334 mg at 07/14/20 0849  . calcium-vitamin D (OSCAL WITH D) 500-200 MG-UNIT per tablet 1 tablet  1 tablet Oral Daily Emokpae, Ejiroghene E, MD   1 tablet at 07/14/20 0851  . Chlorhexidine Gluconate Cloth 2 % PADS 6 each  6 each Topical Q0600 Claudia Desanctis, MD   6 each at 07/14/20 [phone]  . cloNIDine (CATAPRES) tablet 0.3 mg  0.3 mg Oral BID Emokpae, Ejiroghene E, MD   0.3 mg at 07/14/20 0849  . heparin injection 1,000 Units  1,000 Units Dialysis PRN Claudia Desanctis, MD   3,800 Units at 07/13/20 1930  . HYDROmorphone (DILAUDID) injection 0.5 mg  0.5 mg Intravenous Q4H PRN Emokpae, Ejiroghene E, MD   0.5 mg at 07/14/20 0847  . insulin aspart (novoLOG) injection 0-9 Units  0-9 Units Subcutaneous Q6H Emokpae, Ejiroghene E, MD   2 Units at 07/14/20 1208  . LORazepam (ATIVAN) injection 0.5 mg  0.5 mg Intravenous Q6H PRN Emokpae, Courage, MD      . meclizine (ANTIVERT) tablet 25 mg  25 mg Oral BID PRN Roxan Hockey, MD   25 mg at 07/13/20 1907  . melatonin tablet 6 mg  6 mg Oral QHS Emokpae, Ejiroghene E, MD   6 mg at 07/13/20 2035  . ondansetron (ZOFRAN) tablet 4 mg  4 mg Oral Q6H PRN Emokpae, Ejiroghene E, MD       Or  . ondansetron (ZOFRAN) injection 4 mg  4 mg Intravenous Q6H PRN Emokpae,  Ejiroghene E, MD      . polyethylene glycol (MIRALAX / GLYCOLAX) packet 17 g  17 g Oral Daily PRN Emokpae, Ejiroghene E, MD      . sertraline (ZOLOFT) tablet 50 mg  50 mg Oral Daily Emokpae, Ejiroghene E, MD   50 mg at 07/14/20 0849     Discharge Medications: Please see discharge summary for a list of discharge medications.  Relevant Imaging Results:  Relevant Lab Results:   Additional Information Pt EFE:[id number]  Natasha Bence, LCSW

## 2020-07-14 NOTE — Progress Notes (Addendum)
   ORTHOPAEDIC PROGRESS NOTE  s/p Procedure(s): Cephalomedullary nail for right basicervical intertrochanteric femur fracture  DOS: 07/12/2020  SUBJECTIVE: Some confusion continues, more lucid today.  Dialysis yesterday, caused some anxiety.  Still complaining of pain in right hip; doesn't remember she had surgery.   OBJECTIVE: PE:  Alert, confused  Dressing is clean, dry and intact Sensation intact to dorsum and plantar foot Active motion intact TA/EHL/GS 2+ DP pulse Pain with movement of the right hip   Vitals:   07/14/20 0006 07/14/20 0635  BP: 110/71 130/61  Pulse: 98 (!) 103  Resp:  18  Temp:  99.3 F (37.4 C)  SpO2:  99%   XR of the right femur demonstrates excellent position of the cephalomedullary nail.  No acute injuries.   ASSESSMENT: Carly Wood is a 85 y.o. female POD#2 from right hip surgery, stable and improving.  PLAN: Weightbearing: WBAT RLE Insicional and dressing care: Reinforce dressings as needed; ok to leave in place if they remain clean Orthopedic device(s): None VTE prophylaxis: Aspirin 81mg  BID 6 weeks Pain control: PRN medications, PO preferred Follow - up plan: 2 weeks   Contact information:     Trygve Thal A. Amedeo Kinsman, MD [facility] [address] [place],  [place]  [address] Phone: [phone] Fax: [phone]

## 2020-07-14 NOTE — Progress Notes (Signed)
   07/14/20 1656  Assess: MEWS Score  Temp 99.2 F (37.3 C)  BP 140/66  Pulse Rate (!) 115  Resp 20  SpO2 97 %  O2 Device Nasal Cannula  O2 Flow Rate (L/min) 2 L/min  Assess: MEWS Score  MEWS Temp 0  MEWS Systolic 0  MEWS Pulse 2  MEWS RR 0  MEWS LOC 0  MEWS Score 2  MEWS Score Color Yellow  Assess: if the MEWS score is Yellow or Red  Were vital signs taken at a resting state? Yes  Focused Assessment Change from prior assessment (see assessment flowsheet)  Early Detection of Sepsis Score *See Row Information* Low  MEWS guidelines implemented *See Row Information* Yes  Treat  MEWS Interventions Administered scheduled meds/treatments  Pain Scale 0-10  Pain Score 6  Pain Type Surgical pain  Take Vital Signs  Increase Vital Sign Frequency  Yellow: Q 2hr X 2 then Q 4hr X 2, if remains yellow, continue Q 4hrs  Escalate  MEWS: Escalate Yellow: discuss with charge nurse/RN and consider discussing with provider and RRT  Notify: Charge Nurse/RN  Name of Charge Nurse/RN Notified Mardene Celeste  Date Charge Nurse/RN Notified 07/14/20  Time Charge Nurse/RN Notified 6151  Notify: Provider  Provider Name/Title Emokpae  Date Provider Notified 07/14/20  Time Provider Notified 1732  Notification Type Page  Notification Reason Change in status  Provider response Other (Comment) (give atenolol)  Date of Provider Response 07/14/20  Time of Provider Response 1732  Document  Patient Outcome Stabilized after interventions  Progress note created (see row info) Yes

## 2020-07-14 NOTE — TOC Transition Note (Deleted)
Transition of Care [facility]) - CM/SW Discharge Note   Patient Details  Name: Carly Wood MRN: [id number] Date of Birth: 09-10-1935  [facility] CM/SW Contact:  Natasha Bence, LCSW Phone Number: 07/14/2020, 4:56 PM   Clinical Narrative:    Patient is an 85 year old woman admitted for hip fracture. CSW contacted patient's sister to inquire about agreeableness to SNF. Patient's sister reported that patient is a resident at [facility] and would like to Rehab with [facility]. CSW faxed SNF referral for [facility] and started auth. TOC to follow.    Final next level of care: Skilled Nursing Facility Barriers to Discharge: Continued Medical Work up   Patient Goals and CMS Choice Patient states their goals for this hospitalization and ongoing recovery are:: Rehab with SNF CMS Medicare.gov Compare Post Acute Care list provided to:: Patient Choice offered to / list presented to : Patient  Discharge Placement                       Discharge Plan and Services In-house Referral: NA Discharge Planning Services: NA Post Acute Care Choice: NA          DME Arranged: N/A DME Agency: NA       HH Arranged: NA HH Agency: NA        Social Determinants of Health (SDOH) Interventions     Readmission Risk Interventions No flowsheet data found.

## 2020-07-15 ENCOUNTER — Encounter (HOSPITAL_COMMUNITY): Payer: Self-pay | Admitting: Orthopedic Surgery

## 2020-07-15 ENCOUNTER — Encounter: Payer: Medicare PPO | Admitting: Vascular Surgery

## 2020-07-15 LAB — CBC
HCT: 25.2 % — ABNORMAL LOW (ref 36.0–46.0)
Hemoglobin: 7.5 g/dL — ABNORMAL LOW (ref 12.0–15.0)
MCH: 32.2 pg (ref 26.0–34.0)
MCHC: 29.8 g/dL — ABNORMAL LOW (ref 30.0–36.0)
MCV: 108.2 fL — ABNORMAL HIGH (ref 80.0–100.0)
Platelets: 251 10*3/uL (ref 150–400)
RBC: 2.33 MIL/uL — ABNORMAL LOW (ref 3.87–5.11)
RDW: 15.8 % — ABNORMAL HIGH (ref 11.5–15.5)
WBC: 11 10*3/uL — ABNORMAL HIGH (ref 4.0–10.5)
nRBC: 0 % (ref 0.0–0.2)

## 2020-07-15 LAB — BASIC METABOLIC PANEL
Anion gap: 15 (ref 5–15)
BUN: 60 mg/dL — ABNORMAL HIGH (ref 8–23)
CO2: 24 mmol/L (ref 22–32)
Calcium: 8.4 mg/dL — ABNORMAL LOW (ref 8.9–10.3)
Chloride: 96 mmol/L — ABNORMAL LOW (ref 98–111)
Creatinine, Ser: 9.41 mg/dL — ABNORMAL HIGH (ref 0.44–1.00)
GFR, Estimated: 4 mL/min — ABNORMAL LOW (ref >60)
Glucose, Bld: 117 mg/dL — ABNORMAL HIGH (ref 70–99)
Potassium: 5.5 mmol/L — ABNORMAL HIGH (ref 3.5–5.1)
Sodium: 135 mmol/L (ref 135–145)

## 2020-07-15 LAB — GLUCOSE, CAPILLARY
Glucose-Capillary: 167 mg/dL — ABNORMAL HIGH (ref 70–99)
Glucose-Capillary: 151 mg/dL — ABNORMAL HIGH (ref 70–99)
Glucose-Capillary: 135 mg/dL — ABNORMAL HIGH (ref 70–99)
Glucose-Capillary: 119 mg/dL — ABNORMAL HIGH (ref 70–99)
Glucose-Capillary: 140 mg/dL — ABNORMAL HIGH (ref 70–99)
Glucose-Capillary: 101 mg/dL — ABNORMAL HIGH (ref 70–99)

## 2020-07-15 LAB — PREPARE RBC (CROSSMATCH)

## 2020-07-15 MED ORDER — SODIUM CHLORIDE 0.9% IV SOLUTION: Freq: Once | INTRAVENOUS | Status: DC

## 2020-07-15 MED ORDER — ALBUMIN HUMAN 25 % IV SOLN
25.0000 g | Freq: Once | INTRAVENOUS | Status: AC
  Administered 2020-07-15: 25 g via INTRAVENOUS

## 2020-07-15 MED ORDER — HEPARIN SODIUM (PORCINE) 1000 UNIT/ML DIALYSIS
3800.0000 [IU] | INTRAMUSCULAR | Status: DC | PRN
  Filled 2020-07-15: qty 4

## 2020-07-15 MED ORDER — HEPARIN SODIUM (PORCINE) 1000 UNIT/ML IJ SOLN
INTRAMUSCULAR | Status: AC
  Filled 2020-07-15: qty 4

## 2020-07-15 MED ORDER — DARBEPOETIN ALFA 60 MCG/0.3ML IJ SOSY
60.0000 ug | PREFILLED_SYRINGE | INTRAMUSCULAR | Status: DC
  Administered 2020-07-15: 60 ug via INTRAVENOUS
  Filled 2020-07-15: qty 0.3

## 2020-07-15 NOTE — Progress Notes (Signed)
Pt resting well, repositioned pt to the left side. Pt reported pain 5/10 to R hip surgical site. Pt educated on pain management and PRN medication given (see MAR). Bed in low position, alarm active, call bell in reach. Will reassess.

## 2020-07-15 NOTE — Care Management Important Message (Signed)
Important Message  Patient Details  Name: Carly Wood MRN: [id number] Date of Birth: Jan 26, 1936   Medicare Important Message Given:  Yes     Tommy Medal 07/15/2020, 12:15 PM

## 2020-07-15 NOTE — Progress Notes (Signed)
Patient ID: Carly Wood, female   DOB: [birth year], 85 y.o.   MRN: [id number] S: No complaints, no events overnight. O:BP 134/60 (BP Location: Left Arm)   Pulse 82   Temp 98.4 F (36.9 C) (Oral)   Resp 20   Ht 5\' 3"  (1.6 m)   Wt 65.6 kg   SpO2 97%   BMI 25.62 kg/m   Intake/Output Summary (Last 24 hours) at 07/15/2020 0957 Last data filed at 07/15/2020 0949 Gross per 24 hour  Intake 360 ml  Output --  Net 360 ml   Intake/Output: I/O last 3 completed shifts: In: 240 [P.O.:240] Out: -   Intake/Output this shift:  Total I/O In: 120 [P.O.:120] Out: -  Weight change:  Gen: NAD CVS: RRR Resp:CTA Abd: benign Ext: no edema  Recent Labs  Lab 07/11/20 1602 07/12/20 0905 07/13/20 0915 07/15/20 0514  NA 134* 135 135 135  K 4.9 4.9 6.3* 5.5*  CL 93* 93* 96* 96*  CO2 30 27 22 24   GLUCOSE 79 147* 178* 117*  BUN 45* 53* 66* 60*  CREATININE 7.71* 9.68* 11.17* 9.41*  ALBUMIN 3.5  --   --   --   CALCIUM 8.1* 8.5* 8.2* 8.4*  AST 33  --   --   --   ALT 15  --   --   --    Liver Function Tests: Recent Labs  Lab 07/11/20 1602  AST 33  ALT 15  ALKPHOS 68  BILITOT 0.7  PROT 6.8  ALBUMIN 3.5   No results for input(s): LIPASE, AMYLASE in the last 168 hours. No results for input(s): AMMONIA in the last 168 hours. CBC: Recent Labs  Lab 07/11/20 1642 07/12/20 0905 07/13/20 0506 07/14/20 0413 07/15/20 0514  WBC 9.1 10.6* 11.4* 10.7* 11.0*  NEUTROABS 6.7  --   --   --   --   HGB 10.6* 10.8* 8.9* 7.8* 7.5*  HCT 34.6* 34.8* 29.7* 25.5* 25.2*  MCV 109.1* 107.7* 108.8* 107.1* 108.2*  PLT 248 297 303 247 251   Cardiac Enzymes: No results for input(s): CKTOTAL, CKMB, CKMBINDEX, TROPONINI in the last 168 hours. CBG: Recent Labs  Lab 07/14/20 0633 07/14/20 1205 07/14/20 2126 07/15/20 0100 07/15/20 0550  GLUCAP 143* 160* 162* 135* 119*    Iron Studies: No results for input(s): IRON, TIBC, TRANSFERRIN, FERRITIN in the last 72 hours. Studies/Results: DG Chest Port 1  View  Result Date: 07/14/2020 CLINICAL DATA:  Short of breath EXAM: PORTABLE CHEST 1 VIEW COMPARISON:  Prior chest x-ray 07/11/2020 FINDINGS: Left IJ approach tunneled hemodialysis catheter. Catheter tip overlies the superior cavoatrial junction. Stable cardiac and mediastinal contours. Atherosclerotic calcifications again visualized in the transverse aorta. Increased pulmonary vascular congestion. Chronic elevation of the left hemidiaphragm with left basilar atelectasis. No pneumothorax. No acute osseous abnormality. IMPRESSION: 1. Increased pulmonary vascular congestion suggests either mild CHF or fluid overload. 2. Stable and well-positioned left IJ tunneled hemodialysis catheter. Electronically Signed   By: Jacqulynn Cadet M.D.   On: 07/14/2020 11:08   . sodium chloride   Intravenous Once  . acyclovir  200 mg Oral Daily  . amLODipine  10 mg Oral Once per day on Sun Tue Thu Sat  . anastrozole  1 mg Oral Daily  . aspirin EC  81 mg Oral BID  . atenolol  25 mg Oral Daily  . atorvastatin  10 mg Oral QHS  . calcium acetate  1,334 mg Oral BID WC  . calcium-vitamin D  1 tablet  Oral Daily  . Chlorhexidine Gluconate Cloth  6 each Topical Q0600  . cloNIDine  0.3 mg Oral BID  . darbepoetin (ARANESP) injection - DIALYSIS  60 mcg Intravenous Q Mon-HD  . insulin aspart  0-9 Units Subcutaneous Q6H  . melatonin  6 mg Oral QHS  . sertraline  50 mg Oral Daily    BMET    Component Value Date/Time   NA 135 07/15/2020 0514   K 5.5 (H) 07/15/2020 0514   CL 96 (L) 07/15/2020 0514   CO2 24 07/15/2020 0514   GLUCOSE 117 (H) 07/15/2020 0514   BUN 60 (H) 07/15/2020 0514   CREATININE 9.41 (H) 07/15/2020 0514   CALCIUM 8.4 (L) 07/15/2020 0514   CALCIUM 9.0 10/18/2018 1059   GFRNONAA 4 (L) 07/15/2020 0514   GFRAA 5 (L) 12/05/2019 1241   CBC    Component Value Date/Time   WBC 11.0 (H) 07/15/2020 0514   RBC 2.33 (L) 07/15/2020 0514   HGB 7.5 (L) 07/15/2020 0514   HCT 25.2 (L) 07/15/2020 0514   PLT 251  07/15/2020 0514   MCV 108.2 (H) 07/15/2020 0514   MCH 32.2 07/15/2020 0514   MCHC 29.8 (L) 07/15/2020 0514   RDW 15.8 (H) 07/15/2020 0514   LYMPHSABS 1.0 07/11/2020 1642   MONOABS 1.1 (H) 07/11/2020 1642   EOSABS 0.2 07/11/2020 1642   BASOSABS 0.0 07/11/2020 1642     Assessment/Plan:  1. ESRD - normally MWF at [facility], missed HD on Friday due to fall and had HD on 07/13/20.  Plan to get back on outpatient schedule today. 2. Hyperkalemia - normally has 1K bath as outpatient and will treat with HD today 3. Acute right femoral neck fracture - s/p surgical repair 07/12/20 4. ABLA on anemia of CKD stage 5- Hgb dropped following surgery.  Plan for transfusion of 1 unit with HD today. 5. HTN- stable 6. MBD-CKD - renal diet and continue with home binders. 7. Disposition - possible discharge to rehab today after HD.   Donetta Potts, MD [employer] [phone]

## 2020-07-15 NOTE — Procedures (Signed)
   HEMODIALYSIS TREATMENT NOTE:  3.5 hour heparin-free HD completed via [facility]. Goal met: 1.2 liters removed without interruption in UF.  One unit pRBC transfused with HD. All blood was returned.  No changes from pre-HD assessment.  Rockwell Alexandria, RN

## 2020-07-15 NOTE — Progress Notes (Signed)
Patient Demographics:    Carly Wood, is a 85 y.o. female, DOB - 09-11-1935, POE:[id number]  Admit date - 07/11/2020   Admitting Physician Carly Arlyce Dice, MD  Outpatient Primary MD for the patient is Carly Cowden Carly Bougie, NP  LOS - 4   Chief Complaint  Patient presents with  . Fall        Subjective:    Carly Wood today has no fevers, no emesis,  No chest pain,   -Occasional episodes of disorientation and confusion persist --  Assessment  & Plan :    Principal Problem:   Closed right hip fracture (HCC) Active Problems:   ESRD (end stage renal disease) (HCC)   Stage 1 infiltrating ductal carcinoma of right female breast (HCC)   Multiple myeloma (HCC)   Diabetes mellitus with ESRD (end-stage renal disease) ([place])   Anemia due to end stage renal disease ([place])   Vascular dementia with behavior disturbance ([place])   Dependence on renal dialysis ([place])   Hyperkalemia  Brief Summary:- 85 y.o. female with medical history significant for multiple myeloma, diabetes mellitus, ESRD, dementia, hypertension gastric ulcer admitted with Rt Hip Fx post mechanical fall  A/p 1)Rt Hip Fx-- POA--- -Hip Xray on Admission shows impacted right basicervical femoral neck fracture. -S/p Cephalomedullary nail for Right basicervical intertrochanteric femur fracture by Dr Carly Wood on 07/12/20 Post-operative plan:  Weightbearing: The patient will be WBAT on the operative extremity.   DVT prophylaxis :- orthopedics Recommended 81 mg Aspirin BID,  Dressing can be reinforced as needed, will change on POD#2/3 if needed.  Patient does not need to remain hospitalized for dressing change Follow up plan: approximately 2 weeks postop for incision check and XR.  If the patient will be returning to a nursing facility, staples can be removed around this time and a follow up appointment can be scheduled for 6 weeks after surgery. XR  at next office visit:  please obtain AP pelvis, and 2 views of the Right hip and femur -await phy therapy eval, will need SNF Rehab  2)ESRD--M/W/F, last HD 07/13/20,  Plan is for HD on 07/15/20 HD as per Nephrology team -Nephrologist will give Aranesp with HD on 07/15/2020 -We will also need transfusion of PRBC with HD on 07/16/2018  3)H/o Multiple Myeloma--- chemotherapy with Velcade and dexamethasone as per Dr. Delton Wood  4)Breast cancer s/p right mastectomy. -c/n Anastrozole  5)Diabetes mellitus-- HgbA1c- 6.2 reflecting excellent diabetic control-- Use Novolog/Humalog Sliding scale insulin with Accu-Cheks/Fingersticks as ordered   6)HTN and sinus tachycardia-- c/n Amlodipine and clonidine -Okay to use atenolol 25 mg daily with parameters  7)HLD--- c/n Atorvastatin and Aspirin  8)Depression--- c/n zoloft 50 mg daily  9)HyperKalemia in an ESRD pt--- had on 07/13/2020 (did not complete session due to tachycardia) -Plan is for HD on 07/15/2020  10) acute blood loss anemia superimposed on chronic anemia of ESRD-- Hemoglobin is down to 7.5 in the postop patient --Nephrologist will give Aranesp with HD on 07/15/2020 -We will also need transfusion of PRBC with HD on 07/16/2018  Disposition/Need for in-Hospital Stay- patient unable to be discharged at this time due to--awaiting insurance authorization to go to SNF Rehab   Status is: Inpatient  Remains inpatient appropriate because:Please see disposition above  Disposition: The  patient is from: Home              Anticipated d/c is to: SNF              Anticipated d/c date is: 1 day              Patient currently is medically stable to d/c. Barriers: Awaiting insurance authorization to go to SNF rehab  Code Status :  -  Code Status: Full Code   Family Communication:  I called and updated her sister Ms Carly Wood [phone] again on 07/15/20  Consults  :  Ortho/nephrology   DVT Prophylaxis  :   - SCDs  SCDs Start: 07/11/20 2053 asa  81 mg bid per ortho  Lab Results  Component Value Date   PLT 251 07/15/2020   Inpatient Medications  Scheduled Meds: . sodium chloride   Intravenous Once  . acyclovir  200 mg Oral Daily  . amLODipine  10 mg Oral Once per day on Sun Tue Thu Sat  . anastrozole  1 mg Oral Daily  . aspirin EC  81 mg Oral BID  . atenolol  25 mg Oral Daily  . atorvastatin  10 mg Oral QHS  . calcium acetate  1,334 mg Oral BID WC  . calcium-vitamin D  1 tablet Oral Daily  . Chlorhexidine Gluconate Cloth  6 each Topical Q0600  . cloNIDine  0.3 mg Oral BID  . darbepoetin (ARANESP) injection - DIALYSIS  60 mcg Intravenous Q Mon-HD  . insulin aspart  0-9 Units Subcutaneous Q6H  . melatonin  6 mg Oral QHS  . sertraline  50 mg Oral Daily   Continuous Infusions: . sodium chloride    . sodium chloride     PRN Meds:.sodium chloride, sodium chloride, acetaminophen **OR** acetaminophen, heparin, HYDROmorphone (DILAUDID) injection, LORazepam, meclizine, ondansetron **OR** ondansetron (ZOFRAN) IV, polyethylene glycol  Anti-infectives (From admission, onward)   Start     Dose/Rate Route Frequency Ordered Stop   07/12/20 1000  acyclovir (ZOVIRAX) 200 MG capsule 200 mg        200 mg Oral Daily 07/11/20 2052     07/12/20 0600  clindamycin (CLEOCIN) IVPB 900 mg        900 mg 100 mL/hr over 30 Minutes Intravenous On call to O.R. 07/11/20 2151 07/12/20 1217       Objective:   Vitals:   07/15/20 1400 07/15/20 1415 07/15/20 1429 07/15/20 1430  BP: (!) 117/59 130/62 130/60 130/60  Pulse: 67 66 66 66  Resp: 16  16   Temp: 98.6 F (37 C)  98.4 F (36.9 C)   TempSrc: Oral  Oral   SpO2: 100%  100%   Weight:      Height:        Wt Readings from Last 3 Encounters:  07/15/20 66 kg  07/05/20 67.1 kg  07/02/20 63.4 kg    Intake/Output Summary (Last 24 hours) at 07/15/2020 1442 Last data filed at 07/15/2020 0949 Gross per 24 hour  Intake 360 ml  Output --  Net 360 ml   Physical Exam  Gen:- Awake Alert,  In  no apparent distress  HEENT:- [place].AT, No sclera icterus Neck-Supple Neck, HD catheter.  Lungs-fair air movement, no wheezing CV- S1, S2 normal, regular  Abd-  +ve B.Sounds, Abd Soft, No tenderness,    Extremity/Skin:- No  edema, pedal pulses present  Psych-affect is appropriate, oriented x 3 Neuro-generalized weakness, no new focal deficits, no tremors MSK- Lt UE AVF  noted, Rt Hip post op wound is dry/intact   Data Review:   Micro Results No results found for this or any previous visit (from the past 240 hour(s)).  Radiology Reports DG Chest 1 View  Result Date: 07/11/2020 CLINICAL DATA:  Pain following fall EXAM: CHEST  1 VIEW COMPARISON:  November 22, 2017 FINDINGS: Central catheter tip is at the cavoatrial junction. No pneumothorax. There is slight left base atelectasis. Lungs elsewhere clear. Heart is upper normal in size with pulmonary vascularity normal. There is aortic atherosclerosis. No adenopathy. No bone lesions. There are surgical clips in the right axilla. IMPRESSION: Central catheter tip at cavoatrial junction. No pneumothorax. Mild left base atelectasis. No edema or airspace opacity. Stable cardiac silhouette. Aortic Atherosclerosis (ICD10-I70.0). Electronically Signed   By: Lowella Grip III M.D.   On: 07/11/2020 15:14   DG Pelvis 1-2 Views  Result Date: 07/12/2020 CLINICAL DATA:  The patient suffered a right hip fracture in a fall 07/11/2020. Initial encounter. EXAM: PELVIS - 1-2 VIEW COMPARISON:  Plain films right hip 07/11/2020. FINDINGS: New intramedullary nail and hip screw are in place for fixation of a right hip fracture. Hardware is intact. The intramedullary nail is incompletely imaged. Position and alignment are near anatomic. No acute abnormality. IMPRESSION: Status post fixation right hip fracture.  No acute finding. Electronically Signed   By: Inge Rise M.D.   On: 07/12/2020 15:07   DG Knee 1-2 Views Right  Result Date: 07/11/2020 CLINICAL DATA:  Recent  fall with right knee pain, initial encounter EXAM: RIGHT KNEE - 2 VIEW COMPARISON:  None. FINDINGS: No acute fracture or dislocation is noted. Diffuse vascular calcifications are seen. No soft tissue abnormality is noted. IMPRESSION: No acute abnormality noted. Electronically Signed   By: Inez Catalina M.D.   On: 07/11/2020 15:13   PERIPHERAL VASCULAR CATHETERIZATION  Result Date: 07/05/2020 Procedure: Right arm fistulogram, angioplasty right subclavian innominate vein superior vena cava Preoperative diagnosis: Venous hypertension right arm Postoperative diagnosis: Same Anesthesia: Local Operative findings: 1.  80% stenosis right subclavian and innominate and superior vena cava angioplastied up to a 10 mm balloon with complete elastic recoil and no significant improvement Operative details: After obtaining informed consent, the patient was taken the PV lab.  The patient was placed in supine position the angio table.  Patient's right upper extremities prepped and draped in usual sterile fashion.  Local anesthesia was infiltrated over the right upper arm AV fistula.  Micropuncture needle was used to cannulate the right arm AV fistula under ultrasound guidance.  Micropuncture wire was advanced into the fistula.  Micropuncture sheath was placed over this.  Right arm fistulogram and central venogram was then performed through the sheath.  The superior vena cava innominate vein and subclavian veins on the right side are all patent.  However there were multiple segments areas of narrowing up to 80%.  There are abundant collaterals around the shoulder.  The remainder of the AV fistula is widely patent.  Compression on the outflow to reflux contrast across the arterial anastomosis showed this was widely patent as well. At this point the sheath was accessed with an 035 versa core wire and advanced across the central vein stenosis into the right atrium.  This was assisted with a KMP catheter.  A 7 French sheath was then  placed over the wire.  Patient was given 3000 units of intravenous heparin.  I then angioplastied 3 discrete lesions in the right subclavian right innominate and superior vena cava utilizing  multiple inflations of a 10 x 40 balloon.  Due to the recoil nature of the vein the balloon Sliding forward.  Therefore I swapped this out for a 1080 balloon.  This was then inflated to 8 atm for 1 minute.  Completion angiogram really showed no significant improvement and complete Recoil of the narrowing.  A similar procedure had been performed approximately 30 days ago.  In light of this I felt my options were to either stent her central vein potentially encroaching upon the left innominate system and jailing the right internal jugular vein or abandoning this in favor of placing a thigh graft in the near future.  I opted not to stent the central vein segment. At this point the guidewire was removed the sheath was removed and the fistula opening closed with a single figure-of-eight Monocryl stitch. Operative management: The patient has had previous access procedures which failed in the left arm.  She now has venous hypertension and multiple areas of narrowing in the right innominate superior vena cava on the right side.  I do not believe we are going to obtain significant durability of her right arm fistula even if we stent this segment.  We will schedule the patient for outpatient follow-up for consideration of a thigh graft. Ruta Hinds, MD [facility] Office: [phone]   DG Chest Port 1 View  Result Date: 07/14/2020 CLINICAL DATA:  Short of breath EXAM: PORTABLE CHEST 1 VIEW COMPARISON:  Prior chest x-ray 07/11/2020 FINDINGS: Left IJ approach tunneled hemodialysis catheter. Catheter tip overlies the superior cavoatrial junction. Stable cardiac and mediastinal contours. Atherosclerotic calcifications again visualized in the transverse aorta. Increased pulmonary vascular congestion.  Chronic elevation of the left hemidiaphragm with left basilar atelectasis. No pneumothorax. No acute osseous abnormality. IMPRESSION: 1. Increased pulmonary vascular congestion suggests either mild CHF or fluid overload. 2. Stable and well-positioned left IJ tunneled hemodialysis catheter. Electronically Signed   By: Jacqulynn Cadet M.D.   On: 07/14/2020 11:08   VAS US DUPLEX DIALYSIS ACCESS (AVF,AVG)  Result Date: 06/20/2020 DIALYSIS ACCESS Reason for Exam: Routine follow up. Access Site: Right Upper Extremity. Access Type: Brachial-cephalic AVF placed 2/42/6834. History: History of left brachiocephalic AVF. Performing Technologist: Alvia Grove RVT  Examination Guidelines: A complete evaluation includes B-mode imaging, spectral Doppler, color Doppler, and power Doppler as needed of all accessible portions of each vessel. Unilateral testing is considered an integral part of a complete examination. Limited examinations for reoccurring indications may be performed as noted.  Findings: +--------------------+----------+-----------------+--------+ AVF                 PSV (cm/s)Flow Vol (mL/min)Comments +--------------------+----------+-----------------+--------+ Native artery inflow   161           901                +--------------------+----------+-----------------+--------+ AVF Anastomosis        327                              +--------------------+----------+-----------------+--------+  +------------+----------+-------------+-----------+----------------------------+ OUTFLOW VEINPSV (cm/s)Diameter (cm)Depth (cm)           Describe           +------------+----------+-------------+-----------+----------------------------+ Prox UA         79        0.58        0.86    competing branch 41cm/s 0.30  cm dia            +------------+----------+-------------+-----------+----------------------------+ Mid UA         127         0.59        1.02                                 +------------+----------+-------------+-----------+----------------------------+ Dist UA     138 / 154     0.62        0.53     tortuous, slight narrowing  +------------+----------+-------------+-----------+----------------------------+ AC Fossa    161 / 634  1.08 / 0.31 0.26 / 0.57 sharp curve into brachial   +------------+----------+-------------+-----------+----------------------------+   Summary: Patent arteriovenous fistula with sharp curve in the antecubital fossa entering Brachial artery with increased velocity. . *See table(s) above for measurements and observations.  Diagnosing physician: Ruta Hinds MD Electronically signed by Ruta Hinds MD on 06/20/2020 at 2:20:22 PM.    --------------------------------------------------------------------------------   Final    DG HIP OPERATIVE UNILAT WITH PELVIS RIGHT  Result Date: 07/12/2020 CLINICAL DATA:  Hip fracture EXAM: OPERATIVE right HIP (WITH PELVIS IF PERFORMED) 14 VIEWS TECHNIQUE: Fluoroscopic spot image(s) were submitted for interpretation post-operatively. COMPARISON:  07/11/2020 FINDINGS: Fourteen low resolution intraoperative spot views of the right hip. Total fluoroscopy time was 1 minutes 50 seconds. The images demonstrate basicervical/intertrochanteric fracture with subsequent intramedullary rodding and distal screw fixation with anatomic alignment. IMPRESSION: Intraoperative fluoroscopic assistance provided during surgical fixation of proximal right femur fracture. Electronically Signed   By: Donavan Foil M.D.   On: 07/12/2020 16:02   DG Hip Unilat  With Pelvis 2-3 Views Right  Result Date: 07/11/2020 CLINICAL DATA:  Fall, right hip deformity EXAM: DG HIP (WITH OR WITHOUT PELVIS) 2-3V RIGHT COMPARISON:  None. FINDINGS: Single view radiograph pelvis and two view radiograph of the right hip demonstrates an acute, mildly impacted, angulated basicervical right femoral neck  fracture with mild posterior and moderate varus angulation. The femoral head is still seated within the right acetabulum. Mild-to-moderate degenerative hip arthritis is present with joint space narrowing noted bilaterally. The pelvis and left hip are intact. Multiple involuted fibroids are seen within the pelvis. Advanced vascular calcifications are seen within the pelvis and medial thighs. IMPRESSION: Acute, impacted, angulated right basicervical femoral neck fracture. Electronically Signed   By: Fidela Salisbury MD   On: 07/11/2020 15:14   DG FEMUR PORT, MIN 2 VIEWS RIGHT  Result Date: 07/12/2020 CLINICAL DATA:  Postop femoral ORIF. EXAM: RIGHT FEMUR PORTABLE 2 VIEW COMPARISON:  Intraoperative views earlier the same date. Radiographs 07/11/2020. FINDINGS: Status post right femoral dynamic screw and intramedullary fixation of the comminuted intertrochanteric femur fracture. There is near anatomic reduction of the fracture. The hardware is well positioned. Lateral skin staples are present superior to the greater trochanter, and there is soft tissue emphysema laterally in the proximal to mid right thigh. No complications identified. Calcified uterine fibroids and femoral atherosclerosis are noted. IMPRESSION: Near anatomic reduction of intertrochanteric right femur fracture post ORIF. Electronically Signed   By: Richardean Sale M.D.   On: 07/12/2020 15:06    CBC Recent Labs  Lab 07/11/20 1642 07/12/20 0905 07/13/20 0506 07/14/20 0413 07/15/20 0514  WBC 9.1 10.6* 11.4* 10.7* 11.0*  HGB 10.6* 10.8* 8.9* 7.8* 7.5*  HCT 34.6* 34.8* 29.7* 25.5* 25.2*  PLT 248 297 303 247 251  MCV 109.1* 107.7* 108.8* 107.1* 108.2*  MCH 33.4 33.4  32.6 32.8 32.2  MCHC 30.6 31.0 30.0 30.6 29.8*  RDW 15.9* 15.9* 16.1* 15.9* 15.8*  LYMPHSABS 1.0  --   --   --   --   MONOABS 1.1*  --   --   --   --   EOSABS 0.2  --   --   --   --   BASOSABS 0.0  --   --   --   --     Chemistries  Recent Labs  Lab 07/11/20 1602  07/11/20 1750 07/12/20 0905 07/13/20 0915 07/15/20 0514  NA 134*  --  135 135 135  K 4.9  --  4.9 6.3* 5.5*  CL 93*  --  93* 96* 96*  CO2 30  --  '27 22 24  ' GLUCOSE 79  --  147* 178* 117*  BUN 45*  --  53* 66* 60*  CREATININE 7.71*  --  9.68* 11.17* 9.41*  CALCIUM 8.1*  --  8.5* 8.2* 8.4*  MG  --  2.4  --   --   --   AST 33  --   --   --   --   ALT 15  --   --   --   --   ALKPHOS 68  --   --   --   --   BILITOT 0.7  --   --   --   --    ------------------------------------------------------------------------------------------------------------------ No results for input(s): CHOL, HDL, LDLCALC, TRIG, CHOLHDL, LDLDIRECT in the last 72 hours.  Lab Results  Component Value Date   HGBA1C 6.2 (H) 06/13/2020   ------------------------------------------------------------------------------------------------------------------ No results for input(s): TSH, T4TOTAL, T3FREE, THYROIDAB in the last 72 hours.  Invalid input(s): FREET3 ------------------------------------------------------------------------------------------------------------------ No results for input(s): VITAMINB12, FOLATE, FERRITIN, TIBC, IRON, RETICCTPCT in the last 72 hours.  Coagulation profile No results for input(s): INR, PROTIME in the last 168 hours.  No results for input(s): DDIMER in the last 72 hours.  Cardiac Enzymes No results for input(s): CKMB, TROPONINI, MYOGLOBIN in the last 168 hours.  Invalid input(s): CK ------------------------------------------------------------------------------------------------------------------ No results found for: BNP   Roxan Hockey M.D on 07/15/2020 at 2:42 PM  Go to [email or web address] - for contact info  [facility] - Office  [phone]

## 2020-07-15 NOTE — Progress Notes (Signed)
Physical Therapy Treatment Patient Details Name: Carly Wood MRN: [id number] DOB: Nov 26, 1935 Today's Date: 07/15/2020    History of Present Illness Carly Wood is a 85 y.o. female with medical history significant for multiple myeloma, diabetes mellitus, ESRD, dementia, hypertension gastric ulcer.  Patient presented to the ED from [facility] facility with reports of fall.  Patient is awake alert and oriented and able to give me history.  She reports she was walking she turned around, and lost her balance and fell.  She reports right knee pain.  Patient did not lose consciousness, she did not hit her head. Rt Hip Fx-- Hip Xray on Admission shows impacted right basicervical femoral neck fracture.  S/p Cephalomedullary nail for Right basicervical intertrochanteric femur fracture by Dr Amedeo Kinsman on 07/12/20    PT Comments    Pt continues to exhibit generalized weakness and slow, belabored movements requiring substantial assistance to mobilize RLE albeit does not exhibit significant pain response with AAROM of RLE.  Tolerated mobility to EOB well requiring max A to mobilize and assist for positioning.  Trials in sit to stand performed from elevated EOB with pt tolerating partial (crouched) position with difficulty in achieving weight acceptance to BLE due to weakness/RLE guarding.  Pt able to tolerate LE there ex with consistent cues for attention and maintenance of task.  Recommend continued tx as tolerated and progress to next level of care at [facility] for continued rehab services.    Follow Up Recommendations  SNF     Equipment Recommendations  None recommended by PT    Recommendations for Other Services       Precautions / Restrictions      Mobility  Bed Mobility Overal bed mobility: Needs Assistance Bed Mobility: Supine to Sit;Sit to Supine   Sidelying to sit: Max assist;Mod assist Supine to sit: Max assist;Mod assist Sit to supine: Max assist;Mod assist   General bed mobility  comments: slow and labored, PT required to move RLE. Patient Response: Cooperative  Transfers Overall transfer level: Needs assistance Equipment used: Rolling walker (2 wheeled);1 person hand held assist Transfers: Sit to/from Stand Sit to Stand: Max assist         General transfer comment: able to achieve partial (crouched) standing position with poor weight acceptance RLE > LLE  Ambulation/Gait                 Stairs             Wheelchair Mobility    Modified Rankin (Stroke Patients Only)       Balance                                            Cognition Arousal/Alertness: Awake/alert Behavior During Therapy: WFL for tasks assessed/performed Overall Cognitive Status: No family/caregiver present to determine baseline cognitive functioning                                 General Comments: repeated questions as to what happened and why her leg hurt      Exercises General Exercises - Lower Extremity Ankle Circles/Pumps: Strengthening;Both;20 reps Long Arc Quad: Strengthening;Both;20 reps Heel Slides: AAROM;Both;20 reps Hip ABduction/ADduction: AAROM;Right;20 reps    General Comments        Pertinent Vitals/Pain Pain Assessment: Faces Faces Pain Scale: Hurts little  more Pain Location: right hip with movement Pain Descriptors / Indicators: Guarding Pain Intervention(s): Limited activity within patient's tolerance;Monitored during session    Home Living                      Prior Function            PT Goals (current goals can now be found in the care plan section) Acute Rehab PT Goals Patient Stated Goal: return to SNF PT Goal Formulation: With patient Time For Goal Achievement: 07/28/20 Potential to Achieve Goals: Good    Frequency    Min 4X/week      PT Plan      Co-evaluation              AM-PAC PT "6 Clicks" Mobility   Outcome Measure  Help needed turning from your back to  your side while in a flat bed without using bedrails?: A Lot Help needed moving from lying on your back to sitting on the side of a flat bed without using bedrails?: A Lot Help needed moving to and from a bed to a chair (including a wheelchair)?: Total Help needed standing up from a chair using your arms (e.g., wheelchair or bedside chair)?: Total Help needed to walk in hospital room?: Total Help needed climbing 3-5 steps with a railing? : Total 6 Click Score: 8    End of Session Equipment Utilized During Treatment: Gait belt Activity Tolerance: Patient tolerated treatment well;Patient limited by fatigue;Patient limited by pain Patient left: in bed;with call bell/phone within reach;with bed alarm set Nurse Communication: Mobility status PT Visit Diagnosis: Unsteadiness on feet (R26.81);Other abnormalities of gait and mobility (R26.89);Muscle weakness (generalized) (M62.81)     Time: 1068-1661 PT Time Calculation (min) (ACUTE ONLY): 20 min  Charges:  $Therapeutic Exercise: 8-22 mins $Therapeutic Activity: 8-22 mins                     9:16 AM, 07/15/20 M. Sherlyn Lees, PT, DPT Physical Therapist- [place] Office Number: [phone]

## 2020-07-16 ENCOUNTER — Ambulatory Visit (HOSPITAL_COMMUNITY): Payer: Medicare PPO

## 2020-07-16 ENCOUNTER — Inpatient Hospital Stay (HOSPITAL_COMMUNITY): Payer: Medicare PPO

## 2020-07-16 LAB — BASIC METABOLIC PANEL
Anion gap: 15 (ref 5–15)
BUN: 40 mg/dL — ABNORMAL HIGH (ref 8–23)
CO2: 26 mmol/L (ref 22–32)
Calcium: 8.5 mg/dL — ABNORMAL LOW (ref 8.9–10.3)
Chloride: 94 mmol/L — ABNORMAL LOW (ref 98–111)
Creatinine, Ser: 5.68 mg/dL — ABNORMAL HIGH (ref 0.44–1.00)
GFR, Estimated: 7 mL/min — ABNORMAL LOW (ref >60)
Glucose, Bld: 124 mg/dL — ABNORMAL HIGH (ref 70–99)
Potassium: 4.4 mmol/L (ref 3.5–5.1)
Sodium: 135 mmol/L (ref 135–145)

## 2020-07-16 LAB — GLUCOSE, CAPILLARY
Glucose-Capillary: 115 mg/dL — ABNORMAL HIGH (ref 70–99)
Glucose-Capillary: 119 mg/dL — ABNORMAL HIGH (ref 70–99)
Glucose-Capillary: 119 mg/dL — ABNORMAL HIGH (ref 70–99)
Glucose-Capillary: 173 mg/dL — ABNORMAL HIGH (ref 70–99)
Glucose-Capillary: 216 mg/dL — ABNORMAL HIGH (ref 70–99)

## 2020-07-16 LAB — TYPE AND SCREEN
ABO/RH(D): A POS
Antibody Screen: NEGATIVE
Unit division: 0

## 2020-07-16 LAB — BPAM RBC
Blood Product Expiration Date: 202206022359
ISSUE DATE / TIME: 202205091410
Unit Type and Rh: 6200

## 2020-07-16 LAB — CBC
HCT: 29.1 % — ABNORMAL LOW (ref 36.0–46.0)
Hemoglobin: 9.3 g/dL — ABNORMAL LOW (ref 12.0–15.0)
MCH: 32.2 pg (ref 26.0–34.0)
MCHC: 32 g/dL (ref 30.0–36.0)
MCV: 100.7 fL — ABNORMAL HIGH (ref 80.0–100.0)
Platelets: 293 10*3/uL (ref 150–400)
RBC: 2.89 MIL/uL — ABNORMAL LOW (ref 3.87–5.11)
RDW: 19.5 % — ABNORMAL HIGH (ref 11.5–15.5)
WBC: 12.5 10*3/uL — ABNORMAL HIGH (ref 4.0–10.5)
nRBC: 0 % (ref 0.0–0.2)

## 2020-07-16 NOTE — Progress Notes (Signed)
Pt assisted up to [facility] using gait belt for stand and pivot by PT. Pt had large formed BM. Pt bathed, linens changed, new gown and assisted stand/pivot back to bed. Pt tolerated well.

## 2020-07-16 NOTE — Progress Notes (Signed)
Patient Demographics:    Carly Wood, is a 85 y.o. female, DOB - 05-10-1935, GBT:[id number]  Admit date - 07/11/2020   Admitting Physician Ejiroghene Arlyce Dice, MD  Outpatient Primary MD for the patient is [name] [name], NP  LOS - 5   Chief Complaint  Patient presents with  . Fall        Subjective:    Carly Wood today has no fevers, no emesis,  No chest pain,   -Occasional episodes of disorientation and confusion persist -Her sister visited ---Patient is medically ready for discharge -Awaiting insurance approval to transfer to SNF rehab  Assessment  & Plan :    Principal Problem:   Closed right hip fracture ([place]) Active Problems:   ESRD (end stage renal disease) ([place])   Stage 1 infiltrating ductal carcinoma of right female breast ([place])   Multiple myeloma ([place])   Diabetes mellitus with ESRD (end-stage renal disease) ([place])   Anemia due to end stage renal disease ([place])   Vascular dementia with behavior disturbance ([place])   Dependence on renal dialysis ([place])   Hyperkalemia  Brief Summary:- 85 y.o. female with medical history significant for multiple myeloma, diabetes mellitus, ESRD, dementia, hypertension gastric ulcer admitted with Rt Hip Fx post mechanical fall  A/p 1)Rt Hip Fx-- POA--- -Hip Xray on Admission shows impacted right basicervical femoral neck fracture. -S/p Cephalomedullary nail for Right basicervical intertrochanteric femur fracture by Dr Amedeo Kinsman on 07/12/20 Post-operative plan:  Weightbearing: The patient will be WBAT on the operative extremity.   DVT prophylaxis :- orthopedics Recommended 81 mg Aspirin BID,  Dressing can be reinforced as needed, will change on POD#2/3 if needed.  Patient does not need to remain hospitalized for dressing change Follow up plan: approximately 2 weeks postop for incision check and XR.  If the patient will be returning to a nursing facility,  staples can be removed around this time and a follow up appointment can be scheduled for 6 weeks after surgery. XR at next office visit:  please obtain AP pelvis, and 2 views of the Right hip and femur -Patient is medically ready for discharge -Awaiting insurance approval to transfer to SNF rehab  2)ESRD--M/W/F, last HD 07/13/20,  Plan is for HD on 07/15/20 HD as per Nephrology team Received Aranesp with HD on 07/15/2020 Received 1 unit of PRBC with HD on 07/16/2018  3)H/o Multiple Myeloma--- chemotherapy with Velcade and dexamethasone as per Dr. Delton Coombes  4)Breast cancer s/p right mastectomy. -c/n Anastrozole  5)Diabetes mellitus-- HgbA1c- 6.2 reflecting excellent diabetic control-- Use Novolog/Humalog Sliding scale insulin with Accu-Cheks/Fingersticks as ordered   6)HTN and sinus tachycardia-- c/n Amlodipine and clonidine -Okay to use atenolol 25 mg daily with parameters  7)HLD--- c/n Atorvastatin and Aspirin  8)Depression--- c/n zoloft 50 mg daily  9)HyperKalemia in an ESRD pt--- had on 07/13/2020 (did not complete session due to tachycardia) -Plan is for HD on 07/15/2020  10) acute blood loss anemia superimposed on chronic anemia of ESRD-- Hemoglobin is up to 9.3 from 7.5 after 1 unit of PRBC on 07/15/2020 Received Aranesp with HD on 07/15/2020  Disposition/Need for in-Hospital Stay- patient unable to be discharged at this time due to---Patient is medically ready for discharge -Awaiting insurance approval to transfer to SNF rehab  Status is: Inpatient  Remains inpatient appropriate because:Please see disposition above  Disposition: The patient is from: Home              Anticipated d/c is to: SNF              Anticipated d/c date is: 1 day              Patient currently is medically stable to d/c. Barriers: Awaiting insurance authorization to go to SNF rehab  Code Status :  -  Code Status: Full Code   Family Communication:  I called and updated her sister Ms Tiyah Zelenak  [phone] again on 07/15/20 -Sister visited on 07/16/2020 I spoke with her at bedside  Consults  :  Ortho/nephrology   DVT Prophylaxis  :   - SCDs  SCDs Start: 07/11/20 2053 asa 81 mg bid per ortho  Lab Results  Component Value Date   PLT 293 07/16/2020   Inpatient Medications  Scheduled Meds: . sodium chloride   Intravenous Once  . acyclovir  200 mg Oral Daily  . amLODipine  10 mg Oral Once per day on Sun Tue Thu Sat  . anastrozole  1 mg Oral Daily  . aspirin EC  81 mg Oral BID  . atenolol  25 mg Oral Daily  . atorvastatin  10 mg Oral QHS  . calcium acetate  1,334 mg Oral BID WC  . calcium-vitamin D  1 tablet Oral Daily  . Chlorhexidine Gluconate Cloth  6 each Topical Q0600  . cloNIDine  0.3 mg Oral BID  . darbepoetin (ARANESP) injection - DIALYSIS  60 mcg Intravenous Q Mon-HD  . insulin aspart  0-9 Units Subcutaneous Q6H  . melatonin  6 mg Oral QHS  . sertraline  50 mg Oral Daily   Continuous Infusions: . sodium chloride    . sodium chloride     PRN Meds:.sodium chloride, sodium chloride, acetaminophen **OR** acetaminophen, heparin, HYDROmorphone (DILAUDID) injection, LORazepam, meclizine, ondansetron **OR** ondansetron (ZOFRAN) IV, polyethylene glycol  Anti-infectives (From admission, onward)   Start     Dose/Rate Route Frequency Ordered Stop   07/12/20 1000  acyclovir (ZOVIRAX) 200 MG capsule 200 mg        200 mg Oral Daily 07/11/20 2052     07/12/20 0600  clindamycin (CLEOCIN) IVPB 900 mg        900 mg 100 mL/hr over 30 Minutes Intravenous On call to O.R. 07/11/20 2151 07/12/20 1217       Objective:   Vitals:   07/15/20 2105 07/16/20 0513 07/16/20 1054 07/16/20 1353  BP: (!) 137/58 (!) 135/51 (!) 153/56 (!) 113/46  Pulse: 80 79 79 71  Resp: _0 Temp: 98.8 F (37.1 C) 98.7 F (37.1 C)  98 F (36.7 C)  TempSrc: Oral Oral  Oral  SpO2: 99% 95%  94%  Weight:      Height:        Wt Readings from Last 3 Encounters:  07/15/20 66 kg  07/05/20 67.1 kg   07/02/20 63.4 kg    Intake/Output Summary (Last 24 hours) at 07/16/2020 1721 Last data filed at 07/16/2020 1100 Gross per 24 hour  Intake 320 ml  Output --  Net 320 ml   Physical Exam  Gen:- Awake Alert,  In no apparent distress  HEENT:- [place].AT, No sclera icterus Neck-Supple Neck, HD catheter.  Lungs-fair air movement, no wheezing CV- S1, S2 normal, regular  Abd-  +ve B.Sounds, Abd Soft, No  tenderness,    Extremity/Skin:- No  edema, pedal pulses present  Psych-affect is appropriate, oriented x 3 Neuro-generalized weakness, no new focal deficits, no tremors MSK- Lt UE AVF noted, Rt Hip post op wound is dry/intact   Data Review:   Micro Results No results found for this or any previous visit (from the past 240 hour(s)).  Radiology Reports DG Chest 1 View  Result Date: 07/11/2020 CLINICAL DATA:  Pain following fall EXAM: CHEST  1 VIEW COMPARISON:  November 22, 2017 FINDINGS: Central catheter tip is at the cavoatrial junction. No pneumothorax. There is slight left base atelectasis. Lungs elsewhere clear. Heart is upper normal in size with pulmonary vascularity normal. There is aortic atherosclerosis. No adenopathy. No bone lesions. There are surgical clips in the right axilla. IMPRESSION: Central catheter tip at cavoatrial junction. No pneumothorax. Mild left base atelectasis. No edema or airspace opacity. Stable cardiac silhouette. Aortic Atherosclerosis (ICD10-I70.0). Electronically Signed   By: Lowella Grip III M.D.   On: 07/11/2020 15:14   DG Pelvis 1-2 Views  Result Date: 07/12/2020 CLINICAL DATA:  The patient suffered a right hip fracture in a fall 07/11/2020. Initial encounter. EXAM: PELVIS - 1-2 VIEW COMPARISON:  Plain films right hip 07/11/2020. FINDINGS: New intramedullary nail and hip screw are in place for fixation of a right hip fracture. Hardware is intact. The intramedullary nail is incompletely imaged. Position and alignment are near anatomic. No acute abnormality.  IMPRESSION: Status post fixation right hip fracture.  No acute finding. Electronically Signed   By: Inge Rise M.D.   On: 07/12/2020 15:07   DG Knee 1-2 Views Right  Result Date: 07/11/2020 CLINICAL DATA:  Recent fall with right knee pain, initial encounter EXAM: RIGHT KNEE - 2 VIEW COMPARISON:  None. FINDINGS: No acute fracture or dislocation is noted. Diffuse vascular calcifications are seen. No soft tissue abnormality is noted. IMPRESSION: No acute abnormality noted. Electronically Signed   By: Inez Catalina M.D.   On: 07/11/2020 15:13   PERIPHERAL VASCULAR CATHETERIZATION  Result Date: 07/05/2020 Procedure: Right arm fistulogram, angioplasty right subclavian innominate vein superior vena cava Preoperative diagnosis: Venous hypertension right arm Postoperative diagnosis: Same Anesthesia: Local Operative findings: 1.  80% stenosis right subclavian and innominate and superior vena cava angioplastied up to a 10 mm balloon with complete elastic recoil and no significant improvement Operative details: After obtaining informed consent, the patient was taken the PV lab.  The patient was placed in supine position the angio table.  Patient's right upper extremities prepped and draped in usual sterile fashion.  Local anesthesia was infiltrated over the right upper arm AV fistula.  Micropuncture needle was used to cannulate the right arm AV fistula under ultrasound guidance.  Micropuncture wire was advanced into the fistula.  Micropuncture sheath was placed over this.  Right arm fistulogram and central venogram was then performed through the sheath.  The superior vena cava innominate vein and subclavian veins on the right side are all patent.  However there were multiple segments areas of narrowing up to 80%.  There are abundant collaterals around the shoulder.  The remainder of the AV fistula is widely patent.  Compression on the outflow to reflux contrast across the arterial anastomosis showed this was widely  patent as well. At this point the sheath was accessed with an 035 versa core wire and advanced across the central vein stenosis into the right atrium.  This was assisted with a KMP catheter.  A 7 French sheath was then placed  over the wire.  Patient was given 3000 units of intravenous heparin.  I then angioplastied 3 discrete lesions in the right subclavian right innominate and superior vena cava utilizing multiple inflations of a 10 x 40 balloon.  Due to the recoil nature of the vein the balloon Sliding forward.  Therefore I swapped this out for a 1080 balloon.  This was then inflated to 8 atm for 1 minute.  Completion angiogram really showed no significant improvement and complete Recoil of the narrowing.  A similar procedure had been performed approximately 30 days ago.  In light of this I felt my options were to either stent her central vein potentially encroaching upon the left innominate system and jailing the right internal jugular vein or abandoning this in favor of placing a thigh graft in the near future.  I opted not to stent the central vein segment. At this point the guidewire was removed the sheath was removed and the fistula opening closed with a single figure-of-eight Monocryl stitch. Operative management: The patient has had previous access procedures which failed in the left arm.  She now has venous hypertension and multiple areas of narrowing in the right innominate superior vena cava on the right side.  I do not believe we are going to obtain significant durability of her right arm fistula even if we stent this segment.  We will schedule the patient for outpatient follow-up for consideration of a thigh graft. Ruta Hinds, MD [facility] Office: [phone]   DG Chest Port 1 View  Result Date: 07/14/2020 CLINICAL DATA:  Short of breath EXAM: PORTABLE CHEST 1 VIEW COMPARISON:  Prior chest x-ray 07/11/2020 FINDINGS: Left IJ approach tunneled hemodialysis  catheter. Catheter tip overlies the superior cavoatrial junction. Stable cardiac and mediastinal contours. Atherosclerotic calcifications again visualized in the transverse aorta. Increased pulmonary vascular congestion. Chronic elevation of the left hemidiaphragm with left basilar atelectasis. No pneumothorax. No acute osseous abnormality. IMPRESSION: 1. Increased pulmonary vascular congestion suggests either mild CHF or fluid overload. 2. Stable and well-positioned left IJ tunneled hemodialysis catheter. Electronically Signed   By: Jacqulynn Cadet M.D.   On: 07/14/2020 11:08   VAS US DUPLEX DIALYSIS ACCESS (AVF,AVG)  Result Date: 06/20/2020 DIALYSIS ACCESS Reason for Exam: Routine follow up. Access Site: Right Upper Extremity. Access Type: Brachial-cephalic AVF placed 1/49/7026. History: History of left brachiocephalic AVF. Performing Technologist: Alvia Grove RVT  Examination Guidelines: A complete evaluation includes B-mode imaging, spectral Doppler, color Doppler, and power Doppler as needed of all accessible portions of each vessel. Unilateral testing is considered an integral part of a complete examination. Limited examinations for reoccurring indications may be performed as noted.  Findings: +--------------------+----------+-----------------+--------+ AVF                 PSV (cm/s)Flow Vol (mL/min)Comments +--------------------+----------+-----------------+--------+ Native artery inflow   161           901                +--------------------+----------+-----------------+--------+ AVF Anastomosis        327                              +--------------------+----------+-----------------+--------+  +------------+----------+-------------+-----------+----------------------------+ OUTFLOW VEINPSV (cm/s)Diameter (cm)Depth (cm)           Describe           +------------+----------+-------------+-----------+----------------------------+ Prox UA         79  0.58        0.86     competing branch 41cm/s 0.30                                                          cm dia            +------------+----------+-------------+-----------+----------------------------+ Mid UA         127        0.59        1.02                                 +------------+----------+-------------+-----------+----------------------------+ Dist UA     138 / 154     0.62        0.53     tortuous, slight narrowing  +------------+----------+-------------+-----------+----------------------------+ AC Fossa    161 / 634  1.08 / 0.31 0.26 / 0.57 sharp curve into brachial   +------------+----------+-------------+-----------+----------------------------+   Summary: Patent arteriovenous fistula with sharp curve in the antecubital fossa entering Brachial artery with increased velocity. . *See table(s) above for measurements and observations.  Diagnosing physician: Ruta Hinds MD Electronically signed by Ruta Hinds MD on 06/20/2020 at 2:20:22 PM.    --------------------------------------------------------------------------------   Final    DG HIP OPERATIVE UNILAT WITH PELVIS RIGHT  Result Date: 07/12/2020 CLINICAL DATA:  Hip fracture EXAM: OPERATIVE right HIP (WITH PELVIS IF PERFORMED) 14 VIEWS TECHNIQUE: Fluoroscopic spot image(s) were submitted for interpretation post-operatively. COMPARISON:  07/11/2020 FINDINGS: Fourteen low resolution intraoperative spot views of the right hip. Total fluoroscopy time was 1 minutes 50 seconds. The images demonstrate basicervical/intertrochanteric fracture with subsequent intramedullary rodding and distal screw fixation with anatomic alignment. IMPRESSION: Intraoperative fluoroscopic assistance provided during surgical fixation of proximal right femur fracture. Electronically Signed   By: Donavan Foil M.D.   On: 07/12/2020 16:02   DG Hip Unilat  With Pelvis 2-3 Views Right  Result Date: 07/11/2020 CLINICAL DATA:  Fall, right hip deformity EXAM: DG HIP  (WITH OR WITHOUT PELVIS) 2-3V RIGHT COMPARISON:  None. FINDINGS: Single view radiograph pelvis and two view radiograph of the right hip demonstrates an acute, mildly impacted, angulated basicervical right femoral neck fracture with mild posterior and moderate varus angulation. The femoral head is still seated within the right acetabulum. Mild-to-moderate degenerative hip arthritis is present with joint space narrowing noted bilaterally. The pelvis and left hip are intact. Multiple involuted fibroids are seen within the pelvis. Advanced vascular calcifications are seen within the pelvis and medial thighs. IMPRESSION: Acute, impacted, angulated right basicervical femoral neck fracture. Electronically Signed   By: Fidela Salisbury MD   On: 07/11/2020 15:14   DG FEMUR PORT, MIN 2 VIEWS RIGHT  Result Date: 07/12/2020 CLINICAL DATA:  Postop femoral ORIF. EXAM: RIGHT FEMUR PORTABLE 2 VIEW COMPARISON:  Intraoperative views earlier the same date. Radiographs 07/11/2020. FINDINGS: Status post right femoral dynamic screw and intramedullary fixation of the comminuted intertrochanteric femur fracture. There is near anatomic reduction of the fracture. The hardware is well positioned. Lateral skin staples are present superior to the greater trochanter, and there is soft tissue emphysema laterally in the proximal to mid right thigh. No complications identified. Calcified uterine fibroids and femoral atherosclerosis are noted. IMPRESSION: Near anatomic reduction of intertrochanteric right femur fracture  post ORIF. Electronically Signed   By: Richardean Sale M.D.   On: 07/12/2020 15:06    CBC Recent Labs  Lab 07/11/20 1642 07/12/20 0905 07/13/20 0506 07/14/20 0413 07/15/20 0514 07/16/20 0634  WBC 9.1 10.6* 11.4* 10.7* 11.0* 12.5*  HGB 10.6* 10.8* 8.9* 7.8* 7.5* 9.3*  HCT 34.6* 34.8* 29.7* 25.5* 25.2* 29.1*  PLT 248 297 303 247 251 293  MCV 109.1* 107.7* 108.8* 107.1* 108.2* 100.7*  MCH 33.4 33.4 32.6 32.8 32.2 32.2   MCHC 30.6 31.0 30.0 30.6 29.8* 32.0  RDW 15.9* 15.9* 16.1* 15.9* 15.8* 19.5*  LYMPHSABS 1.0  --   --   --   --   --   MONOABS 1.1*  --   --   --   --   --   EOSABS 0.2  --   --   --   --   --   BASOSABS 0.0  --   --   --   --   --     Chemistries  Recent Labs  Lab 07/11/20 1602 07/11/20 1750 07/12/20 0905 07/13/20 0915 07/15/20 0514 07/16/20 0634  NA 134*  --  135 135 135 135  K 4.9  --  4.9 6.3* 5.5* 4.4  CL 93*  --  93* 96* 96* 94*  CO2 30  --  _0 GLUCOSE 79  --  147* 178* 117* 124*  BUN 45*  --  53* 66* 60* 40*  CREATININE 7.71*  --  9.68* 11.17* 9.41* 5.68*  CALCIUM 8.1*  --  8.5* 8.2* 8.4* 8.5*  MG  --  2.4  --   --   --   --   AST 33  --   --   --   --   --   ALT 15  --   --   --   --   --   ALKPHOS 68  --   --   --   --   --   BILITOT 0.7  --   --   --   --   --    ------------------------------------------------------------------------------------------------------------------ No results for input(s): CHOL, HDL, LDLCALC, TRIG, CHOLHDL, LDLDIRECT in the last 72 hours.  Lab Results  Component Value Date   HGBA1C 6.2 (H) 06/13/2020   ------------------------------------------------------------------------------------------------------------------ No results for input(s): TSH, T4TOTAL, T3FREE, THYROIDAB in the last 72 hours.  Invalid input(s): FREET3 ------------------------------------------------------------------------------------------------------------------ No results for input(s): VITAMINB12, FOLATE, FERRITIN, TIBC, IRON, RETICCTPCT in the last 72 hours.  Coagulation profile No results for input(s): INR, PROTIME in the last 168 hours.  No results for input(s): DDIMER in the last 72 hours.  Cardiac Enzymes No results for input(s): CKMB, TROPONINI, MYOGLOBIN in the last 168 hours.  Invalid input(s): CK ------------------------------------------------------------------------------------------------------------------ No results found for:  BNP   Roxan Hockey M.D on 07/16/2020 at 5:21 PM  Go to [email or web address] - for contact info  [facility] - Office  [phone]

## 2020-07-16 NOTE — Progress Notes (Signed)
Patient ID: LUNELL ROBART, female   DOB: April 06, 1935, 85 y.o.   MRN: [id number] S: Wants to go back to the [facility] today. O:BP (!) 135/51 (BP Location: Left Arm)   Pulse 79   Temp 98.7 F (37.1 C) (Oral)   Resp 16   Ht 5\' 3"  (1.6 m)   Wt 66 kg   SpO2 95%   BMI 25.77 kg/m   Intake/Output Summary (Last 24 hours) at 07/16/2020 1032 Last data filed at 07/15/2020 1741 Gross per 24 hour  Intake 515 ml  Output 1270 ml  Net -755 ml   Intake/Output: I/O last 3 completed shifts: In: 858 [P.O.:320; Blood:315] Out: 8502 [Other:1270]  Intake/Output this shift:  No intake/output data recorded. Weight change:  Gen: NAD CVS: RRR Resp: cta Abd: +BS, soft, NT/Nd Ext: no edema, RUE AVF +T/B  Dialysis access:  [facility]   Recent Labs  Lab 07/11/20 1602 07/12/20 0905 07/13/20 0915 07/15/20 0514 07/16/20 0634  NA 134* 135 135 135 135  K 4.9 4.9 6.3* 5.5* 4.4  CL 93* 93* 96* 96* 94*  CO2 30 27 22 24 26   GLUCOSE 79 147* 178* 117* 124*  BUN 45* 53* 66* 60* 40*  CREATININE 7.71* 9.68* 11.17* 9.41* 5.68*  ALBUMIN 3.5  --   --   --   --   CALCIUM 8.1* 8.5* 8.2* 8.4* 8.5*  AST 33  --   --   --   --   ALT 15  --   --   --   --    Liver Function Tests: Recent Labs  Lab 07/11/20 1602  AST 33  ALT 15  ALKPHOS 68  BILITOT 0.7  PROT 6.8  ALBUMIN 3.5   No results for input(s): LIPASE, AMYLASE in the last 168 hours. No results for input(s): AMMONIA in the last 168 hours. CBC: Recent Labs  Lab 07/11/20 1642 07/12/20 0905 07/13/20 0506 07/14/20 0413 07/15/20 0514 07/16/20 0634  WBC 9.1 10.6* 11.4* 10.7* 11.0* 12.5*  NEUTROABS 6.7  --   --   --   --   --   HGB 10.6* 10.8* 8.9* 7.8* 7.5* 9.3*  HCT 34.6* 34.8* 29.7* 25.5* 25.2* 29.1*  MCV 109.1* 107.7* 108.8* 107.1* 108.2* 100.7*  PLT 248 297 303 247 251 293   Cardiac Enzymes: No results for input(s): CKTOTAL, CKMB, CKMBINDEX, TROPONINI in the last 168 hours. CBG: Recent Labs  Lab 07/15/20 1118 07/15/20 1722 07/16/20 0028  07/16/20 0626 07/16/20 0741  GLUCAP 140* 101* 119* 119* 115*    Iron Studies: No results for input(s): IRON, TIBC, TRANSFERRIN, FERRITIN in the last 72 hours. Studies/Results: No results found. . sodium chloride   Intravenous Once  . acyclovir  200 mg Oral Daily  . amLODipine  10 mg Oral Once per day on Sun Tue Thu Sat  . anastrozole  1 mg Oral Daily  . aspirin EC  81 mg Oral BID  . atenolol  25 mg Oral Daily  . atorvastatin  10 mg Oral QHS  . calcium acetate  1,334 mg Oral BID WC  . calcium-vitamin D  1 tablet Oral Daily  . Chlorhexidine Gluconate Cloth  6 each Topical Q0600  . cloNIDine  0.3 mg Oral BID  . darbepoetin (ARANESP) injection - DIALYSIS  60 mcg Intravenous Q Mon-HD  . insulin aspart  0-9 Units Subcutaneous Q6H  . melatonin  6 mg Oral QHS  . sertraline  50 mg Oral Daily    BMET  Component Value Date/Time   NA 135 07/16/2020 0634   K 4.4 07/16/2020 0634   CL 94 (L) 07/16/2020 0634   CO2 26 07/16/2020 0634   GLUCOSE 124 (H) 07/16/2020 0634   BUN 40 (H) 07/16/2020 0634   CREATININE 5.68 (H) 07/16/2020 0634   CALCIUM 8.5 (L) 07/16/2020 0634   CALCIUM 9.0 10/18/2018 1059   GFRNONAA 7 (L) 07/16/2020 0634   GFRAA 5 (L) 12/05/2019 1241   CBC    Component Value Date/Time   WBC 12.5 (H) 07/16/2020 0634   RBC 2.89 (L) 07/16/2020 0634   HGB 9.3 (L) 07/16/2020 0634   HCT 29.1 (L) 07/16/2020 0634   PLT 293 07/16/2020 0634   MCV 100.7 (H) 07/16/2020 0634   MCH 32.2 07/16/2020 0634   MCHC 32.0 07/16/2020 0634   RDW 19.5 (H) 07/16/2020 0634   LYMPHSABS 1.0 07/11/2020 1642   MONOABS 1.1 (H) 07/11/2020 1642   EOSABS 0.2 07/11/2020 1642   BASOSABS 0.0 07/11/2020 1642     Assessment/Plan:  1. ESRD - normally MWF at [facility], missed HD on Friday due to fall and had HD on 07/13/20.  Back on schedule and tolerated HD well yesterday.  Will plan for HD tomorrow unless she is discharged to SNF today. 2. Hyperkalemia - improved with her normal 1K bath 3. Acute  right femoral neck fracture - s/p surgical repair 07/12/20 4. ABLA on anemia of CKD stage 5- Hgb dropped following surgery.  S/p transfusion of 1 unit with HD 07/15/20.  Also given aranesp 60 mcg on 07/15/20 5. HTN- stable 6. MBD-CKD - renal diet and continue with home binders. 7. Disposition - awaiting discharge to [facility] center SNF insurance clearance.    Donetta Potts, MD [employer] [phone]

## 2020-07-17 ENCOUNTER — Inpatient Hospital Stay
Admission: RE | Admit: 2020-07-17 | Discharge: 2020-07-22 | Disposition: A | Discharge status: Nursing Home (Includes ICF) | Payer: Medicare PPO | Source: Ambulatory Visit | Attending: Internal Medicine | Admitting: Internal Medicine

## 2020-07-17 ENCOUNTER — Encounter: Payer: Self-pay | Admitting: Adult Health

## 2020-07-17 ENCOUNTER — Non-Acute Institutional Stay (SKILLED_NURSING_FACILITY): Payer: Medicare PPO | Admitting: Adult Health

## 2020-07-17 DIAGNOSIS — S72001S Fracture of unspecified part of neck of right femur, sequela: Secondary | ICD-10-CM

## 2020-07-17 DIAGNOSIS — E875 Hyperkalemia: Secondary | ICD-10-CM | POA: Diagnosis not present

## 2020-07-17 DIAGNOSIS — D631 Anemia in chronic kidney disease: Secondary | ICD-10-CM | POA: Diagnosis not present

## 2020-07-17 DIAGNOSIS — E1169 Type 2 diabetes mellitus with other specified complication: Secondary | ICD-10-CM | POA: Diagnosis present

## 2020-07-17 DIAGNOSIS — I70209 Unspecified atherosclerosis of native arteries of extremities, unspecified extremity: Secondary | ICD-10-CM | POA: Diagnosis not present

## 2020-07-17 DIAGNOSIS — E785 Hyperlipidemia, unspecified: Secondary | ICD-10-CM

## 2020-07-17 DIAGNOSIS — I700 Atherosclerosis of aorta: Secondary | ICD-10-CM

## 2020-07-17 DIAGNOSIS — K922 Gastrointestinal hemorrhage, unspecified: Secondary | ICD-10-CM | POA: Diagnosis not present

## 2020-07-17 DIAGNOSIS — E1122 Type 2 diabetes mellitus with diabetic chronic kidney disease: Secondary | ICD-10-CM | POA: Diagnosis not present

## 2020-07-17 DIAGNOSIS — K254 Chronic or unspecified gastric ulcer with hemorrhage: Secondary | ICD-10-CM | POA: Diagnosis not present

## 2020-07-17 DIAGNOSIS — F339 Major depressive disorder, recurrent, unspecified: Secondary | ICD-10-CM | POA: Diagnosis not present

## 2020-07-17 DIAGNOSIS — K219 Gastro-esophageal reflux disease without esophagitis: Secondary | ICD-10-CM | POA: Diagnosis present

## 2020-07-17 DIAGNOSIS — Z20822 Contact with and (suspected) exposure to covid-19: Secondary | ICD-10-CM | POA: Diagnosis not present

## 2020-07-17 DIAGNOSIS — B009 Herpesviral infection, unspecified: Secondary | ICD-10-CM

## 2020-07-17 DIAGNOSIS — R404 Transient alteration of awareness: Secondary | ICD-10-CM | POA: Diagnosis not present

## 2020-07-17 DIAGNOSIS — F0391 Unspecified dementia with behavioral disturbance: Secondary | ICD-10-CM | POA: Diagnosis not present

## 2020-07-17 DIAGNOSIS — E1142 Type 2 diabetes mellitus with diabetic polyneuropathy: Secondary | ICD-10-CM | POA: Diagnosis present

## 2020-07-17 DIAGNOSIS — Z888 Allergy status to other drugs, medicaments and biological substances status: Secondary | ICD-10-CM | POA: Diagnosis not present

## 2020-07-17 DIAGNOSIS — F01518 Vascular dementia, unspecified severity, with other behavioral disturbance: Secondary | ICD-10-CM

## 2020-07-17 DIAGNOSIS — Z992 Dependence on renal dialysis: Secondary | ICD-10-CM | POA: Diagnosis not present

## 2020-07-17 DIAGNOSIS — I890 Lymphedema, not elsewhere classified: Secondary | ICD-10-CM | POA: Diagnosis present

## 2020-07-17 DIAGNOSIS — C50911 Malignant neoplasm of unspecified site of right female breast: Secondary | ICD-10-CM

## 2020-07-17 DIAGNOSIS — Z79811 Long term (current) use of aromatase inhibitors: Secondary | ICD-10-CM | POA: Diagnosis not present

## 2020-07-17 DIAGNOSIS — N250 Renal osteodystrophy: Secondary | ICD-10-CM | POA: Diagnosis not present

## 2020-07-17 DIAGNOSIS — C9000 Multiple myeloma not having achieved remission: Secondary | ICD-10-CM

## 2020-07-17 DIAGNOSIS — K625 Hemorrhage of anus and rectum: Secondary | ICD-10-CM | POA: Diagnosis present

## 2020-07-17 DIAGNOSIS — I120 Hypertensive chronic kidney disease with stage 5 chronic kidney disease or end stage renal disease: Secondary | ICD-10-CM | POA: Diagnosis not present

## 2020-07-17 DIAGNOSIS — N186 End stage renal disease: Secondary | ICD-10-CM

## 2020-07-17 DIAGNOSIS — D6481 Anemia due to antineoplastic chemotherapy: Secondary | ICD-10-CM | POA: Diagnosis not present

## 2020-07-17 DIAGNOSIS — S72001A Fracture of unspecified part of neck of right femur, initial encounter for closed fracture: Secondary | ICD-10-CM | POA: Diagnosis not present

## 2020-07-17 DIAGNOSIS — K920 Hematemesis: Secondary | ICD-10-CM | POA: Diagnosis not present

## 2020-07-17 DIAGNOSIS — E1129 Type 2 diabetes mellitus with other diabetic kidney complication: Secondary | ICD-10-CM | POA: Diagnosis not present

## 2020-07-17 DIAGNOSIS — K259 Gastric ulcer, unspecified as acute or chronic, without hemorrhage or perforation: Secondary | ICD-10-CM | POA: Diagnosis not present

## 2020-07-17 DIAGNOSIS — I959 Hypotension, unspecified: Secondary | ICD-10-CM | POA: Diagnosis not present

## 2020-07-17 DIAGNOSIS — R58 Hemorrhage, not elsewhere classified: Secondary | ICD-10-CM | POA: Diagnosis not present

## 2020-07-17 DIAGNOSIS — Z9011 Acquired absence of right breast and nipple: Secondary | ICD-10-CM | POA: Diagnosis not present

## 2020-07-17 DIAGNOSIS — Z853 Personal history of malignant neoplasm of breast: Secondary | ICD-10-CM | POA: Diagnosis not present

## 2020-07-17 DIAGNOSIS — D62 Acute posthemorrhagic anemia: Secondary | ICD-10-CM | POA: Diagnosis not present

## 2020-07-17 DIAGNOSIS — F0151 Vascular dementia with behavioral disturbance: Secondary | ICD-10-CM | POA: Diagnosis present

## 2020-07-17 DIAGNOSIS — S72141D Displaced intertrochanteric fracture of right femur, subsequent encounter for closed fracture with routine healing: Secondary | ICD-10-CM | POA: Diagnosis not present

## 2020-07-17 DIAGNOSIS — H409 Unspecified glaucoma: Secondary | ICD-10-CM | POA: Diagnosis present

## 2020-07-17 DIAGNOSIS — D509 Iron deficiency anemia, unspecified: Secondary | ICD-10-CM | POA: Diagnosis not present

## 2020-07-17 DIAGNOSIS — E1151 Type 2 diabetes mellitus with diabetic peripheral angiopathy without gangrene: Secondary | ICD-10-CM | POA: Diagnosis present

## 2020-07-17 DIAGNOSIS — R1310 Dysphagia, unspecified: Secondary | ICD-10-CM | POA: Diagnosis present

## 2020-07-17 DIAGNOSIS — K264 Chronic or unspecified duodenal ulcer with hemorrhage: Secondary | ICD-10-CM | POA: Diagnosis not present

## 2020-07-17 DIAGNOSIS — Z23 Encounter for immunization: Secondary | ICD-10-CM | POA: Diagnosis not present

## 2020-07-17 DIAGNOSIS — R531 Weakness: Secondary | ICD-10-CM | POA: Diagnosis not present

## 2020-07-17 DIAGNOSIS — K269 Duodenal ulcer, unspecified as acute or chronic, without hemorrhage or perforation: Secondary | ICD-10-CM | POA: Diagnosis not present

## 2020-07-17 DIAGNOSIS — G8918 Other acute postprocedural pain: Secondary | ICD-10-CM | POA: Diagnosis not present

## 2020-07-17 DIAGNOSIS — D649 Anemia, unspecified: Secondary | ICD-10-CM | POA: Diagnosis not present

## 2020-07-17 DIAGNOSIS — K2289 Other specified disease of esophagus: Secondary | ICD-10-CM | POA: Diagnosis not present

## 2020-07-17 LAB — CBC
HCT: 29.8 % — ABNORMAL LOW (ref 36.0–46.0)
Hemoglobin: 9.4 g/dL — ABNORMAL LOW (ref 12.0–15.0)
MCH: 31.6 pg (ref 26.0–34.0)
MCHC: 31.5 g/dL (ref 30.0–36.0)
MCV: 100.3 fL — ABNORMAL HIGH (ref 80.0–100.0)
Platelets: 300 10*3/uL (ref 150–400)
RBC: 2.97 MIL/uL — ABNORMAL LOW (ref 3.87–5.11)
RDW: 18.2 % — ABNORMAL HIGH (ref 11.5–15.5)
WBC: 11.5 10*3/uL — ABNORMAL HIGH (ref 4.0–10.5)
nRBC: 0 % (ref 0.0–0.2)

## 2020-07-17 LAB — BASIC METABOLIC PANEL
Anion gap: 14 (ref 5–15)
BUN: 68 mg/dL — ABNORMAL HIGH (ref 8–23)
CO2: 26 mmol/L (ref 22–32)
Calcium: 8.5 mg/dL — ABNORMAL LOW (ref 8.9–10.3)
Chloride: 95 mmol/L — ABNORMAL LOW (ref 98–111)
Creatinine, Ser: 8.04 mg/dL — ABNORMAL HIGH (ref 0.44–1.00)
GFR, Estimated: 5 mL/min — ABNORMAL LOW (ref >60)
Glucose, Bld: 140 mg/dL — ABNORMAL HIGH (ref 70–99)
Potassium: 4.2 mmol/L (ref 3.5–5.1)
Sodium: 135 mmol/L (ref 135–145)

## 2020-07-17 LAB — GLUCOSE, CAPILLARY
Glucose-Capillary: 116 mg/dL — ABNORMAL HIGH (ref 70–99)
Glucose-Capillary: 119 mg/dL — ABNORMAL HIGH (ref 70–99)
Glucose-Capillary: 123 mg/dL — ABNORMAL HIGH (ref 70–99)
Glucose-Capillary: 137 mg/dL — ABNORMAL HIGH (ref 70–99)
Glucose-Capillary: 140 mg/dL — ABNORMAL HIGH (ref 70–99)
Glucose-Capillary: 243 mg/dL — ABNORMAL HIGH (ref 70–99)

## 2020-07-17 MED ORDER — ASPIRIN 81 MG PO TBEC: 81.0000 mg | DELAYED_RELEASE_TABLET | Freq: Two times a day (BID) | ORAL | 1 refills | Status: DC

## 2020-07-17 MED ORDER — HEPARIN SODIUM (PORCINE) 1000 UNIT/ML IJ SOLN
3800.0000 [IU] | Freq: Once | INTRAMUSCULAR | Status: AC
  Administered 2020-07-17: 3800 [IU]

## 2020-07-17 MED ORDER — INSULIN ASPART 100 UNIT/ML FLEXPEN: 0.0000 [IU] | PEN_INJECTOR | Freq: Three times a day (TID) | SUBCUTANEOUS | 11 refills | Status: DC

## 2020-07-17 NOTE — Progress Notes (Signed)
Patient ID: YAIRE KREHER, female   DOB: March 25, 1935, 85 y.o.   MRN: [id number] S: No events overnight. O:BP (!) 132/56   Pulse 76   Temp 99.2 F (37.3 C) (Oral)   Resp 20   Ht 5\' 3"  (1.6 m)   Wt 66 kg   SpO2 98%   BMI 25.77 kg/m   Intake/Output Summary (Last 24 hours) at 07/17/2020 1057 Last data filed at 07/16/2020 1940 Gross per 24 hour  Intake 240 ml  Output --  Net 240 ml   Intake/Output: I/O last 3 completed shifts: In: 240 [P.O.:240] Out: -   Intake/Output this shift:  No intake/output data recorded. Weight change:  Gen: NAD CVS: RRR Resp: CTA Abd: benign Ext: no edema, RUE AVF +T/B Dialysis access:  [facility]  Recent Labs  Lab 07/11/20 1602 07/12/20 0905 07/13/20 0915 07/15/20 0514 07/16/20 0634 07/17/20 0540  NA 134* 135 135 135 135 135  K 4.9 4.9 6.3* 5.5* 4.4 4.2  CL 93* 93* 96* 96* 94* 95*  CO2 30 27 22 24 26 26   GLUCOSE 79 147* 178* 117* 124* 140*  BUN 45* 53* 66* 60* 40* 68*  CREATININE 7.71* 9.68* 11.17* 9.41* 5.68* 8.04*  ALBUMIN 3.5  --   --   --   --   --   CALCIUM 8.1* 8.5* 8.2* 8.4* 8.5* 8.5*  AST 33  --   --   --   --   --   ALT 15  --   --   --   --   --    Liver Function Tests: Recent Labs  Lab 07/11/20 1602  AST 33  ALT 15  ALKPHOS 68  BILITOT 0.7  PROT 6.8  ALBUMIN 3.5   No results for input(s): LIPASE, AMYLASE in the last 168 hours. No results for input(s): AMMONIA in the last 168 hours. CBC: Recent Labs  Lab 07/11/20 1642 07/12/20 0905 07/13/20 0506 07/14/20 0413 07/15/20 0514 07/16/20 0634 07/17/20 0540  WBC 9.1   < > 11.4* 10.7* 11.0* 12.5* 11.5*  NEUTROABS 6.7  --   --   --   --   --   --   HGB 10.6*   < > 8.9* 7.8* 7.5* 9.3* 9.4*  HCT 34.6*   < > 29.7* 25.5* 25.2* 29.1* 29.8*  MCV 109.1*   < > 108.8* 107.1* 108.2* 100.7* 100.3*  PLT 248   < > 303 247 251 293 300   < > = values in this interval not displayed.   Cardiac Enzymes: No results for input(s): CKTOTAL, CKMB, CKMBINDEX, TROPONINI in the last 168  hours. CBG: Recent Labs  Lab 07/16/20 1900 07/17/20 0039 07/17/20 0614 07/17/20 0619 07/17/20 0809  GLUCAP 216* 116* 137* 140* 123*    Iron Studies: No results for input(s): IRON, TIBC, TRANSFERRIN, FERRITIN in the last 72 hours. Studies/Results: No results found. . sodium chloride   Intravenous Once  . acyclovir  200 mg Oral Daily  . amLODipine  10 mg Oral Once per day on Sun Tue Thu Sat  . anastrozole  1 mg Oral Daily  . aspirin EC  81 mg Oral BID  . atenolol  25 mg Oral Daily  . atorvastatin  10 mg Oral QHS  . calcium acetate  1,334 mg Oral BID WC  . calcium-vitamin D  1 tablet Oral Daily  . Chlorhexidine Gluconate Cloth  6 each Topical Q0600  . cloNIDine  0.3 mg Oral BID  . darbepoetin (ARANESP)  injection - DIALYSIS  60 mcg Intravenous Q Mon-HD  . insulin aspart  0-9 Units Subcutaneous Q6H  . melatonin  6 mg Oral QHS  . sertraline  50 mg Oral Daily    BMET    Component Value Date/Time   NA 135 07/17/2020 0540   K 4.2 07/17/2020 0540   CL 95 (L) 07/17/2020 0540   CO2 26 07/17/2020 0540   GLUCOSE 140 (H) 07/17/2020 0540   BUN 68 (H) 07/17/2020 0540   CREATININE 8.04 (H) 07/17/2020 0540   CALCIUM 8.5 (L) 07/17/2020 0540   CALCIUM 9.0 10/18/2018 1059   GFRNONAA 5 (L) 07/17/2020 0540   GFRAA 5 (L) 12/05/2019 1241   CBC    Component Value Date/Time   WBC 11.5 (H) 07/17/2020 0540   RBC 2.97 (L) 07/17/2020 0540   HGB 9.4 (L) 07/17/2020 0540   HCT 29.8 (L) 07/17/2020 0540   PLT 300 07/17/2020 0540   MCV 100.3 (H) 07/17/2020 0540   MCH 31.6 07/17/2020 0540   MCHC 31.5 07/17/2020 0540   RDW 18.2 (H) 07/17/2020 0540   LYMPHSABS 1.0 07/11/2020 1642   MONOABS 1.1 (H) 07/11/2020 1642   EOSABS 0.2 07/11/2020 1642   BASOSABS 0.0 07/11/2020 1642     Assessment/Plan:  1. ESRD - normally MWF at [facility], missed HD on Friday due to fall and had HD on 07/13/20. Back on schedule and tolerated HD.  Will plan for HD today then f/u for outpatient HD on Friday at her  home unit.  2. Hyperkalemia - improved with her normal 1K bath 3. Acute right femoral neck fracture - s/p surgical repair 07/12/20 4. ABLA on anemia of CKD stage 5- Hgb dropped following surgery. S/p transfusion of 1 unit with HD 07/15/20.  Also given aranesp 60 mcg on 07/15/20 5. HTN- stable 6. MBD-CKD - renal diet and continue with home binders. 7. Disposition - discharge to [facility] SNF after HD today.   Donetta Potts, MD [employer] [phone]

## 2020-07-17 NOTE — Progress Notes (Signed)
Location:  [facility] Room Number: 148-D Place of Service:  SNF (31)   CODE STATUS: Full code  Allergies  Allergen Reactions  . Ace Inhibitors Other (See Comments) and Cough    Tongue swell  . Penicillins Other (See Comments)    Unsure of reaction Has patient had a PCN reaction causing immediate rash, facial/tongue/throat swelling, SOB or lightheadedness with hypotension: Unknown Has patient had a PCN reaction causing severe rash involving mucus membranes or skin necrosis: Unknown Has patient had a PCN reaction that required hospitalization: No Has patient had a PCN reaction occurring within the last 10 years: Unknown If all of the above answers are "NO", then may proceed with Cephalosporin use.      Chief Complaint  Patient presents with  . Hospitalization Follow-up    Follow-up from recent hospital stay    HPI:  She is a 85 year old woman who has been hospitalized from 07-11-20 through 07-17-20. Her medical history includes: diabetes; ESRD; hypertension; dementia. She had a fall on 07-11-20; was taken to the ED and found to have a right femoral head fracture. She had an IM nail done on 07-12-20. She is a long term resident; she is WBAT. She denies any pain unless she is moved; no changes in appetite; no anxiety. She will continue to be followed for her chronic illnesses including: Chronic kidney disease with end stage renal disease on dialysis due to type 2 diabetes mellitus/dependence on dialysis: Hypertension due to end stage renal disease (ESRD) is stable due to type 2 diabetes mellitus:   Hyperlipidemia associated with type 2 diabetes mellitus:  Past Medical History:  Diagnosis Date  . Anemia   . Anxiety   . Chronic kidney disease   . Chronic renal disease, stage 4, severely decreased glomerular filtration rate (GFR) between 15-29 mL/min/1.73 square meter (HCC) 08/22/2015  . Complication of anesthesia    delirious after Breast Surgery  . Dementia ([place])     mild  . Depression   . Diabetes mellitus without complication ([place])    type II  . Dyspnea    with activity  . GERD (gastroesophageal reflux disease)   . Glaucoma   . Hypertension   . Pneumonia   . Stage 1 infiltrating ductal carcinoma of right female breast ([place]) 08/21/2015   ER+ PR+ HER 2 neu + (3+) T1cN0     Past Surgical History:  Procedure Laterality Date  . A/V FISTULAGRAM Right 07/05/2020   Procedure: A/V Fistulagram;  Surgeon: Elam Dutch, MD;  Location: [facility];  Service: Cardiovascular;  Laterality: Right;  . AV FISTULA PLACEMENT Left 11/22/2017   Procedure: ARTERIOVENOUS (AV) FISTULA CREATION LEFT ARM;  Surgeon: Elam Dutch, MD;  Location: [facility] OR;  Service: Vascular;  Laterality: Left;  . AV FISTULA PLACEMENT Right 04/04/2020   Procedure: RIGHT ARM ARTERIOVENOUS FISTULA CREATION;  Surgeon: Rosetta Posner, MD;  Location: [facility] ORS;  Service: Vascular;  Laterality: Right;  . BIOPSY  08/07/2016   Procedure: BIOPSY;  Surgeon: Daneil Dolin, MD;  [facility];  Service: Endoscopy;;  gastric ulcer biopsy  . COLONOSCOPY    . ESOPHAGOGASTRODUODENOSCOPY N/A 08/07/2016   Procedure: ESOPHAGOGASTRODUODENOSCOPY (EGD);  Surgeon: Daneil Dolin, MD;  [facility];  Service: Endoscopy;  Laterality: N/A;  1215-rescheduled to 6/1 @ 2:30pm per Tretha Sciara  . ESOPHAGOGASTRODUODENOSCOPY N/A 11/27/2016   Procedure: ESOPHAGOGASTRODUODENOSCOPY (EGD);  Surgeon: Daneil Dolin, MD;  [facility];  Service:  Endoscopy;  Laterality: N/A;  8:15am  . FISTULA SUPERFICIALIZATION Left 02/14/2018   Procedure: FISTULA SUPERFICIALIZATION LEFT ARM;  Surgeon: Angelia Mould, MD;  Location: [facility];  Service: Vascular;  Laterality: Left;  . FRACTURE SURGERY Right    ankle  . INTRAMEDULLARY (IM) NAIL INTERTROCHANTERIC Right 07/12/2020   Procedure: INTRAMEDULLARY (IM) NAIL INTERTROCHANTRIC;  Surgeon: Mordecai Rasmussen, MD;  Location: [facility];  Service: Orthopedics;   Laterality: Right;  . MALONEY DILATION N/A 08/07/2016   Procedure: Venia Minks DILATION;  Surgeon: Daneil Dolin, MD;  [facility];  Service: Endoscopy;  Laterality: N/A;  . MASTECTOMY, PARTIAL Right   . PERIPHERAL VASCULAR BALLOON ANGIOPLASTY Left 07/13/2019   Procedure: PERIPHERAL VASCULAR BALLOON ANGIOPLASTY;  Surgeon: Marty Heck, MD;  Location: [facility];  Service: Cardiovascular;  Laterality: Left;  arm fistulogram  . PERIPHERAL VASCULAR BALLOON ANGIOPLASTY Right 05/22/2020   Procedure: PERIPHERAL VASCULAR BALLOON ANGIOPLASTY;  Surgeon: Cherre Robins, MD;  Location: [facility];  Service: Cardiovascular;  Laterality: Right;  arm fistula  . PERIPHERAL VASCULAR BALLOON ANGIOPLASTY Right 07/05/2020   Procedure: PERIPHERAL VASCULAR BALLOON ANGIOPLASTY;  Surgeon: Elam Dutch, MD;  Location: [facility];  Service: Cardiovascular;  Laterality: Right;  arm fistula  . PORT-A-CATH REMOVAL Left 11/22/2017   Procedure: REMOVAL PORT-A-CATH LEFT CHEST;  Surgeon: Elam Dutch, MD;  Location: [facility] OR;  Service: Vascular;  Laterality: Left;  . RETINAL DETACHMENT SURGERY Right     Social History   Socioeconomic History  . Marital status: Single    Spouse name: Not on file  . Number of children: Not on file  . Years of education: Not on file  . Highest education level: Not on file  Occupational History  . Occupation: retired   Tobacco Use  . Smoking status: Never Smoker  . Smokeless tobacco: Never Used  Vaping Use  . Vaping Use: Never used  Substance and Sexual Activity  . Alcohol use: No    Alcohol/week: 0.0 standard drinks  . Drug use: No  . Sexual activity: Never  Other Topics Concern  . Not on file  Social History Narrative   Long term resident of SNF    Social Determinants of Health   Financial Resource Strain: Low Risk   . Difficulty of Paying Living Expenses: Not very hard  Food Insecurity: No Food Insecurity  . Worried About  Charity fundraiser in the Last Year: Never true  . Ran Out of Food in the Last Year: Never true  Transportation Needs: No Transportation Needs  . Lack of Transportation (Medical): No  . Lack of Transportation (Non-Medical): No  Physical Activity: Inactive  . Days of Exercise per Week: 0 days  . Minutes of Exercise per Session: 0 min  Stress: No Stress Concern Present  . Feeling of Stress : Not at all  Social Connections: Moderately Isolated  . Frequency of Communication with Friends and Family: More than three times a week  . Frequency of Social Gatherings with Friends and Family: Once a week  . Attends Religious Services: More than 4 times per year  . Active Member of Clubs or Organizations: No  . Attends Archivist Meetings: Never  . Marital Status: Never married  Intimate Partner Violence: Not At Risk  . Fear of Current or Ex-Partner: No  . Emotionally Abused: No  . Physically Abused: No  . Sexually Abused: No   Family History  Problem Relation Age of  Onset  . Multiple myeloma Sister   . Brain cancer Sister   . Dementia Mother        died at 81  . Stroke Mother   . Heart failure Mother   . Diabetes Mother   . Heart disease Father   . Prostate cancer Brother   . Colon cancer Neg Hx       VITAL SIGNS BP 120/64   Pulse 84   Temp 99.4 F (37.4 C)   Resp 20   Ht '5\' 3"'  (1.6 m)   Wt 140 lb 3.2 oz (63.6 kg)   SpO2 96%   BMI 24.84 kg/m    Outpatient Encounter Medications as of 07/17/2020  Medication Sig  . UNABLE TO FIND Diet - renal carb modified 1200cc fluid restriction  . acetaminophen (TYLENOL) 650 MG CR tablet Take 650 mg by mouth every 6 (six) hours.  Marland Kitchen acyclovir (ZOVIRAX) 200 MG capsule Take 200 mg by mouth daily.  Marland Kitchen amLODipine (NORVASC) 10 MG tablet Take 10 mg by mouth daily. Not taking on dialysis days  . anastrozole (ARIMIDEX) 1 MG tablet TAKE 1 TABLET BY MOUTH DAILY (Patient taking differently: Take 1 mg by mouth daily.)  . aspirin EC 81 MG  tablet Take 81 mg by mouth daily.  Marland Kitchen atorvastatin (LIPITOR) 10 MG tablet Take 10 mg by mouth at bedtime.  . calcium acetate (PHOSLO) 667 MG capsule Take 667-1,334 mg by mouth See admin instructions. Take 1,334 mg twice daily with meals and 667 with snacks daily  . calcium-vitamin D (OSCAL WITH D) 500-200 MG-UNIT tablet Take 1 tablet by mouth daily. At 8 am  . cloNIDine (CATAPRES) 0.3 MG tablet Take 0.3 mg by mouth 2 (two) times daily.   . insulin aspart (NOVOLOG FLEXPEN) 100 UNIT/ML FlexPen Inject 10 Units into the skin 2 (two) times daily. 8 am and 5 pm, Hold for accu-check less than 150.  Marland Kitchen insulin aspart (NOVOLOG FLEXPEN) 100 UNIT/ML FlexPen Inject 15 Units into the skin with lunch   . insulin glargine (LANTUS SOLOSTAR) 100 UNIT/ML Solostar Pen Inject 10 Units into the skin at bedtime.  . melatonin 3 MG TABS tablet Take 6 mg by mouth at bedtime. For Sleep  . Multiple Vitamin (MULTIVITAMIN) capsule Take 1 capsule by mouth daily.  . Nutritional Supplements (NEPRO PO) Take 1 Can by mouth at bedtime. (Patient not taking: Reported on 07/11/2020)  . sertraline (ZOLOFT) 50 MG tablet Take 50 mg by mouth daily.  . [DISCONTINUED] NON FORMULARY Diet: _____ Regular, ___x___ NAS, _______Consistent Carbohydrate, _______NPO __x___Other Low Potassium (Patient not taking: Reported on 07/17/2020)     SIGNIFICANT DIAGNOSTIC EXAMS  TODAY  07-11-20: chest x-ray:  Central catheter tip at cavoatrial junction. No pneumothorax. Mild left base atelectasis. No edema or airspace opacity. Stable cardiac silhouette. Aortic Atherosclerosis   07-11-20: right knee x-ray:  No acute fracture or dislocation is noted. Diffuse vascular calcifications are seen. No soft tissue abnormality is noted.  07-11-20: right hip x-ray:  Acute, impacted, angulated right basicervical femoral neck fracture.    LABS REVIEWED PREVIOUS  10-13-19: hgb a1c 6.0; vit B 12: 1126 folate 20.9 10-17-19: wbc 8.8; hgb 8.3; hct 26.1; mcv 114.0 plt 270;  glucose 190; bun 28; creat 4.85; k+ 3.9; na++ 127; ca 8.9 liver normal albumin 4.0  10-31-19: wbc 6.0; hgb 8.8; hct 28.6; mcv 117.2 plt 326; glucose 188; bun 45; creat 6.28; k+ 3.9; na++ 137;ca 9.1 liver normal albumin 3.9 11-07-19: wbc 6.1; hgb 9.2; hct 29.4  mcv 115.7 plt 276; glucose 201; bun 44; creat 5.93; k+ 4.5; na++ 136; ca 9.1 liver normal albumin 3.8 11-14-19; wbc 8.3; hgb 9.7; hct 31.2 mcv 116.4 plt 261; glucose 198; bun 44; creat 6.66; k+ 5.1; na++ 138; ca 8.8 liver normal albumin 3.9   12-12-19: wbc 7.6; hgb 11.3; hct 35.6; mcv 109.9 plt 212; glucose 602; bun 67; creat 8.62; k+ 5.6; na++ 135; ca 7.9 liver normal albumin 3.5  12-20-19: hgb a1c 8.0  01-09-20: wbc 6.9; hgb 12.1; hct 39.7; mcv 105.6 plt 299; glucose 225; bun 112; creat 14.53; k+ 6.4; na++ 132; ca 8.3 liver normal albumin 3.5  01-23-20: k+ 6.6  01-23-20: wbc 7.9; hgb 11.6; hct 36.9; mcv 103.7 plt 359; glucose 139; bun 109; creat 15.46; k+ 6.1; na++ 140; ca 8.7 liver normal albumin 4.1;  01-24-20: k+ 4.4  01-30-20: wbc 10.6; hgb 9.7; hct 32.0; mcv 103.6 plt 390; glucose 164; bun 44; creat 9.08; k+ 4.1; na++ 139; ca 9.0 liver normal albumin 3.3 02-06-20: wbc 7.8; hgb 9.9; hct 32.7 mcv 104.8 plt 344; glucose 211; bun 90; creat 10.97; k+ 6.0 na++ 137; ca 8.8 liver normal albumin 3.4  03-26-20: wbc 6.3; hgb 9.4; hct 30.4; mcv 104.1; plt 300; glucose 223; bun 101; creat 14.22; k+ 5.8; na++ 135; ca 8.1 GFR 2; liver normal albumin 3.4  05-21-20; wbc 5.1; hgb 10.4; hct 32.8; mcv 106.8 plt 303; glucose 186; bun 41; creat 1.54; k+ 5.0; na++ 132; ca 8.4 GFR 5; liver normal albumin 3.7 06-13-20: chol 189; ldl 106; trig 211; hdl 41; hgb a1c 6.2    TODAY  07-11-20: wbc 9.1; hgb 10.6; hct 34.6; mcv 109.1 plt 248; glucose 79; bun 45; creat 7.71 k+ 4.9; na++ 134; ca 8.1 GFR 5; liver normal albumin 3.5 mag 2.4 07-13-20: wbc 11.4; hgb 8.9; hct 29.7; mcv 108.8 plt 303; glucose 178; bun 66; creat 11.17; k+ 6.6; na++ 135; ca 8.2; GFR 3 07-15-20: wbc 11.0; hgb  7.5; hct 25.2; mcv 108.2 plt 251; glucose 117; bun 60; creat 9.41; k+ 5.5; na++ 138; ca 8.4 GFR 4 07-17-20: wbc 11.5; hgb 9.4; hct 29.8; mcv 100.3 plt 300; glucose 140; bun 68; creat 8.04; k+ 4.2; na++ 135; ca 8.5 GFR 5    Review of Systems  Constitutional: Negative for malaise/fatigue.  Respiratory: Negative for cough and shortness of breath.   Cardiovascular: Negative for chest pain, palpitations and leg swelling.  Gastrointestinal: Negative for abdominal pain, constipation and heartburn.  Musculoskeletal: Negative for back pain, joint pain and myalgias.  Skin: Negative.   Neurological: Negative for dizziness.  Psychiatric/Behavioral: The patient is not nervous/anxious.       Physical Exam Constitutional:      General: She is not in acute distress.    Appearance: She is well-developed. She is not diaphoretic.  Neck:     Thyroid: No thyromegaly.  Cardiovascular:     Rate and Rhythm: Normal rate and regular rhythm.     Pulses: Normal pulses.     Heart sounds: Normal heart sounds.  Pulmonary:     Effort: Pulmonary effort is normal. No respiratory distress.     Breath sounds: Normal breath sounds.  Abdominal:     General: Bowel sounds are normal. There is no distension.     Palpations: Abdomen is soft.     Tenderness: There is no abdominal tenderness.  Musculoskeletal:     Cervical back: Neck supple.     Right lower leg: No edema.  Left lower leg: No edema.     Comments: Left neck dialysis access Right arm ace wrap on     Right hip IM nail 07-12-20   Lymphadenopathy:     Cervical: No cervical adenopathy.  Skin:    General: Skin is warm and dry.     Comments: Incision line without signs of infection present.   Neurological:     General: No focal deficit present.     Mental Status: She is alert.  Psychiatric:        Mood and Affect: Mood normal.       ASSESSMENT/ PLAN:  TODAY  1. Closed right hip sequela: is stable status post right hip nail on 07-12-20: will  continue therapy as directed; will follow up with orthopedics as indicated; will continue tylenol cr 650 mg every 6 hours  2. Chronic kidney disease with end stage renal disease on dialysis due to type 2 diabetes mellitus/dependence on dialysis: is without change  Is on dialysis three days weekly; will continue 1200 cc fluid restriction; will continue phoshyra 667 mg 2 tabs twice daily with meals and 1 with snacks.  3.  Hypertension due to end stage renal disease (ESRD) is stable due to type 2 diabetes mellitus: is stable b/p 120/64: will continue norvasc 5 mg daily four times weekly; clonidine 0.3 mg twice daily asa 81 mg daily   4. Hyperlipidemia associated with type 2 diabetes mellitus: is stable LDL 106 will continue lipitor 10 mg daily   5. Diabetes mellitus with end stage renal disease (ESRD): is stable hgb a1c 6.2 will continue novolog 10 units with breakfast and supper 15 units with lunch; basaglar 10 units nightly   6. Aortic atherosclerosis:/atherosclerotic peripheral vascular disease: is stable (scan 10-04-18) will monitor   7. Vascular dementia with behavioral disturbance: is without change in status weight 150 pounds  8. Anemia due to end stage renal disease: is stable hgb 9.4 will monitor   9. Stage 1 infiltrating ductal carcinoma of female right breast/ status post mastectomy right: will continue arimidex 1 mg daily   10. Multiple myeloma not having achieved remission: is without change is followed by oncology  11. Herpes: without recent outbreak: will continue acyclovir 200 mg daily   12. Major depression chronic recurrent: is stable will continue zoloft 100 mg daily    Time spent with patient: 45 minutes: regarding medications; therapy needs; goals of care.   Ok Edwards NP [facility]  Contact [phone] Monday through Friday 8am- 5pm  After hours call [phone]

## 2020-07-17 NOTE — Discharge Summary (Signed)
Carly Wood, is a 85 y.o. female  DOB [birth year]  MRN [id number].  Admission date:  07/11/2020  Admitting Physician  Bethena Roys, MD  Discharge Date:  07/17/2020   Primary MD  Gerlene Fee, NP  Recommendations for primary care physician for things to follow:   1)Post-operative plan:  Weightbearing:The patient will be WBAT on the operative extremity.  DVT prophylaxis:- orthopedicsRecommended 81 mg Aspirin BID,  Dressingcan be reinforced as needed,   Follow up plan: approximately 2 weeks postop for incision check and XR. If the patient will be returning to a nursing facility, staples can be removed around this time and a follow up appointment can be scheduled for 6 weeks after surgery. XR at next office visit: please obtain AP pelvis, and 2 views of the Righthip and femur  2)Avoid ibuprofen/Advil/Aleve/Motrin/Goody Powders/Naproxen/BC powders/Meloxicam/Diclofenac/Indomethacin and other Nonsteroidal anti-inflammatory medications as these will make you more likely to bleed and can cause stomach ulcers, can also cause Kidney problems.  3)ESRD--continue hemodialysis on M/W/F, last HD 07/17/20,   4)Repeat BMP and Repeat CBC on Monday 07/22/20   Admission Diagnosis  Fall [W19.XXXA] Closed right hip fracture ([facility]) [S72.001A] Closed fracture of right hip, initial encounter [facility]) [S72.001A]   Discharge Diagnosis  Fall [W19.XXXA] Closed right hip fracture ([place]) [S72.001A] Closed fracture of right hip, initial encounter ([place]) [S72.001A]    Principal Problem:   Closed right hip fracture ([place]) Active Problems:   ESRD (end stage renal disease) ([place])   Stage 1 infiltrating ductal carcinoma of right female breast ([place])   Multiple myeloma ([place])   Diabetes mellitus with ESRD (end-stage renal disease) ([place])   Anemia due to end stage renal disease ([place])   Vascular dementia with behavior  disturbance ([place])   Dependence on renal dialysis ([place])   Hyperkalemia      Past Medical History:  Diagnosis Date  . Anemia   . Anxiety   . Chronic kidney disease   . Chronic renal disease, stage 4, severely decreased glomerular filtration rate (GFR) between 15-29 mL/min/1.73 square meter (HCC) 08/22/2015  . Complication of anesthesia    delirious after Breast Surgery  . Dementia ([place])    mild  . Depression   . Diabetes mellitus without complication ([place])    type II  . Dyspnea    with activity  . GERD (gastroesophageal reflux disease)   . Glaucoma   . Hypertension   . Pneumonia   . Stage 1 infiltrating ductal carcinoma of right female breast ([place]) 08/21/2015   ER+ PR+ HER 2 neu + (3+) T1cN0     Past Surgical History:  Procedure Laterality Date  . A/V FISTULAGRAM Right 07/05/2020   Procedure: A/V Fistulagram;  Surgeon: Elam Dutch, MD;  Location: [place] [facility];  Service: Cardiovascular;  Laterality: Right;  . AV FISTULA PLACEMENT Left 11/22/2017   Procedure: ARTERIOVENOUS (AV) FISTULA CREATION LEFT ARM;  Surgeon: Elam Dutch, MD;  Location[facility];  Service: Vascular;  Laterality: Left;  . AV FISTULA PLACEMENT Right  04/04/2020   Procedure: RIGHT ARM ARTERIOVENOUS FISTULA CREATION;  Surgeon: Rosetta Posner, MD;  Location: [facility] OR[facility];  Service: Vascular;  Laterality: Right;  . BIOPSY  08/07/2016   Procedure: BIOPSY;  Surgeon: Daneil Dolin, MD;  Location[facility];  Service: Endoscopy;;  gastric ulcer biopsy  . COLONOSCOPY    . ESOPHAGOGASTRODUODENOSCOPY N/A 08/07/2016   Procedure: ESOPHAGOGASTRODUODENOSCOPY (EGD);  Surgeon: Daneil Dolin, MD;  [facility];  Service: Endoscopy;  Laterality: N/A;  1215-rescheduled to 6/1 @ 2:30pm per Tretha Sciara  . ESOPHAGOGASTRODUODENOSCOPY N/A 11/27/2016   Procedure: ESOPHAGOGASTRODUODENOSCOPY (EGD);  Surgeon: Daneil Dolin, MD;  [facility];  Service: Endoscopy;  Laterality: N/A;  8:15am  . FISTULA  SUPERFICIALIZATION Left 02/14/2018   Procedure: FISTULA SUPERFICIALIZATION LEFT ARM;  Surgeon: Angelia Mould, MD;  Location: [place];  Service: Vascular;  Laterality: Left;  . FRACTURE SURGERY Right    ankle  . INTRAMEDULLARY (IM) NAIL INTERTROCHANTERIC Right 07/12/2020   Procedure: INTRAMEDULLARY (IM) NAIL INTERTROCHANTRIC;  Surgeon: Mordecai Rasmussen, MD;  Location: [facility];  Service: Orthopedics;  Laterality: Right;  . MALONEY DILATION N/A 08/07/2016   Procedure: Venia Minks DILATION;  Surgeon: Daneil Dolin, MD;  [facility];  Service: Endoscopy;  Laterality: N/A;  . MASTECTOMY, PARTIAL Right   . PERIPHERAL VASCULAR BALLOON ANGIOPLASTY Left 07/13/2019   Procedure: PERIPHERAL VASCULAR BALLOON ANGIOPLASTY;  Surgeon: Marty Heck, MD;  Location: [place] [facility];  Service: Cardiovascular;  Laterality: Left;  arm fistulogram  . PERIPHERAL VASCULAR BALLOON ANGIOPLASTY Right 05/22/2020   Procedure: PERIPHERAL VASCULAR BALLOON ANGIOPLASTY;  Surgeon: Cherre Robins, MD;  Location: [place] [facility];  Service: Cardiovascular;  Laterality: Right;  arm fistula  . PERIPHERAL VASCULAR BALLOON ANGIOPLASTY Right 07/05/2020   Procedure: PERIPHERAL VASCULAR BALLOON ANGIOPLASTY;  Surgeon: Elam Dutch, MD;  Location: [place] [facility];  Service: Cardiovascular;  Laterality: Right;  arm fistula  . PORT-A-CATH REMOVAL Left 11/22/2017   Procedure: REMOVAL PORT-A-CATH LEFT CHEST;  Surgeon: Elam Dutch, MD;  Location: [facility] OR;  Service: Vascular;  Laterality: Left;  . RETINAL DETACHMENT SURGERY Right        HPI  from the history and physical done on the day of admission:     Chief Complaint: Fall,   HPI: Carly Wood is a 86 y.o. female with medical history significant for multiple myeloma, diabetes mellitus, ESRD, dementia, hypertension gastric ulcer. Patient presented to the ED from [facility] facility with reports of fall.  Patient is awake alert and oriented and able to  give me history.  She reports she was walking she turned around, and lost her balance and fell.  She reports right knee pain.  Patient did not lose consciousness, she did not hit her head.  ED Course: Stable vitals.  Unremarkable CBC.  BUN/creatinine elevated ESRD patient.  Knee x-rays negative.  Portable chest x-ray unremarkable.  Pelvic x-ray-acute impacted right basicervical femoral neck fracture. EDP talked to Dr. Colon Branch, will see in consult.  Review of Systems: As per HPI all other systems reviewed and negative   Hospital Course:     Brief Summary:- 85 y.o.femalewith medical history significant formultiple myeloma, diabetes mellitus, ESRD, dementia, hypertension gastric ulcer admitted with Rt Hip Fx post mechanical fall  A/p 1)Rt Hip Fx-- POA--- -Hip Xray on Admission shows impacted right basicervical femoral neck fracture. -S/p Cephalomedullary nail forRightbasicervicalintertrochanteric femurfracture by Dr Amedeo Kinsman on 07/12/20 Post-operative plan:  Weightbearing:The patient will be WBAT  on the operative extremity.  DVT prophylaxis:- orthopedicsRecommended 81 mg Aspirin BID,  Dressingcan be reinforced as needed, will change on POD#2/3 if needed. Patient does not need to remain hospitalized for dressing change Follow up plan: approximately 2 weeks postop for incision check and XR. If the patient will be returning to a nursing facility, staples can be removed around this time and a follow up appointment can be scheduled for 6 weeks after surgery. XR at next office visit: please obtain AP pelvis, and 2 views of the Righthip and femur -Patient is medically ready for discharge  transfer to SNF rehab  2)ESRD--M/W/F, last HD 07/17/20,  HD as per Nephrology team Received Aranesp with HD on 07/15/2020 Received 1 unit of PRBC with HD on 07/16/2018  3)H/o Multiple Myeloma--- chemotherapy with Velcade and dexamethasone as per Dr. Delton Coombes  4)Breast cancers/pright  mastectomy. -c/n Anastrozole  5)Diabetes mellitus-- HgbA1c-6.2 reflecting excellent diabetic control-- -Lantus 10 units daily Use Novolog/Humalog Sliding scale insulin with Accu-Cheks/Fingersticks as ordered   6)HTN --stable, c/n Amlodipine and clonidine  7)HLD--- c/n Atorvastatin and Aspirin  8)Depression--- c/n zoloft 50 mg daily  9)HyperKalemia in an ESRD pt--- resolved post hemodialysis  10) acute blood loss anemia superimposed on chronic anemia of ESRD-- Hemoglobin is up to 9.4 from 7.5 after 1 unit of PRBC on 07/15/2020 Received Aranesp with HD on 07/15/2020 -Repeat CBC on Monday, 07/22/2020  Disposition/--transfer to SNF rehab  Disposition: The patient is from: Home  Anticipated d/c is to: SNF   Code Status :  -  Code Status: Full Code   Family Communication:  I called and updated her sister Ms Gwyn Hieronymus [phone]   -Sister visited on 07/16/2020 I spoke with her at bedside  Consults  :  Ortho/nephrology   DVT Prophylaxis  :   - SCDs  SCDs Start: 07/11/20 2053 asa 81 mg bid per ortho   Discharge Condition: stable  Follow UP---Nephrology  Diet and Activity recommendation:  As advised  Discharge Instructions    Discharge Instructions    Call MD for:  difficulty breathing, headache or visual disturbances   Complete by: As directed    Call MD for:  persistant dizziness or light-headedness   Complete by: As directed    Call MD for:  persistant nausea and vomiting   Complete by: As directed    Call MD for:  temperature >100.4   Complete by: As directed    Diet - low sodium heart healthy   Complete by: As directed    Discharge instructions   Complete by: As directed    1)Post-operative plan:  Weightbearing:The patient will be WBAT on the operative extremity.  DVT prophylaxis:- orthopedicsRecommended 81 mg Aspirin BID,  Dressingcan be reinforced as needed,   Follow up plan: approximately 2 weeks postop for  incision check and XR. If the patient will be returning to a nursing facility, staples can be removed around this time and a follow up appointment can be scheduled for 6 weeks after surgery. XR at next office visit: please obtain AP pelvis, and 2 views of the Righthip and femur  2)Avoid ibuprofen/Advil/Aleve/Motrin/Goody Powders/Naproxen/BC powders/Meloxicam/Diclofenac/Indomethacin and other Nonsteroidal anti-inflammatory medications as these will make you more likely to bleed and can cause stomach ulcers, can also cause Kidney problems.  3)ESRD--continue hemodialysis on M/W/F, last HD 07/17/20,   4)Repeat BMP and Repeat CBC on Monday 07/22/20   Discharge wound care:   Complete by: As directed    Keep dressing dry--- change as needed if wet  or soiled   Increase activity slowly   Complete by: As directed         Discharge Medications     Allergies as of 07/17/2020      Reactions   Ace Inhibitors Other (See Comments), Cough   Tongue swell   Penicillins Other (See Comments)   Unsure of reaction Has patient had a PCN reaction causing immediate rash, facial/tongue/throat swelling, SOB or lightheadedness with hypotension: Unknown Has patient had a PCN reaction causing severe rash involving mucus membranes or skin necrosis: Unknown Has patient had a PCN reaction that required hospitalization: No Has patient had a PCN reaction occurring within the last 10 years: Unknown If all of the above answers are "NO", then may proceed with Cephalosporin use.      Medication List    STOP taking these medications   UNABLE TO FIND     TAKE these medications   acetaminophen 650 MG CR tablet Commonly known as: TYLENOL Take 650 mg by mouth every 6 (six) hours.   acyclovir 200 MG capsule Commonly known as: ZOVIRAX Take 200 mg by mouth daily.   amLODipine 10 MG tablet Commonly known as: NORVASC Take 10 mg by mouth daily. Not taking on dialysis days   anastrozole 1 MG tablet Commonly known  as: ARIMIDEX TAKE 1 TABLET BY MOUTH DAILY   aspirin 81 MG EC tablet Take 1 tablet (81 mg total) by mouth 2 (two) times daily. Swallow whole. What changed:   when to take this  additional instructions   atorvastatin 10 MG tablet Commonly known as: LIPITOR Take 10 mg by mouth at bedtime.   calcium acetate 667 MG capsule Commonly known as: PHOSLO Take 667-1,334 mg by mouth See admin instructions. Take 1,334 mg twice daily with meals and 667 with snacks daily   calcium-vitamin D 500-200 MG-UNIT tablet Commonly known as: OSCAL WITH D Take 1 tablet by mouth daily. At 8 am   cloNIDine 0.3 MG tablet Commonly known as: CATAPRES Take 0.3 mg by mouth 2 (two) times daily.   insulin aspart 100 UNIT/ML FlexPen Commonly known as: NOVOLOG Inject 0-10 Units into the skin 3 (three) times daily with meals. insulin aspart (novoLOG) injection 0-10 Units  0-10 Units Subcutaneous, 3 times daily with meals  CBG < 70: Implement Hypoglycemia Standing Orders and refer to Hypoglycemia Standing Orders sidebar report   CBG 70 - 120: 0 unit CBG 121 - 150: 0 unit  CBG 151 - 200: 1 unit  CBG 201 - 250: 2 units  CBG 251 - 300: 4 units  CBG 301 - 350: 6 units   CBG 351 - 400: 8 units  CBG > 400: 10 units What changed:   how much to take  when to take this  additional instructions  Another medication with the same name was removed. Continue taking this medication, and follow the directions you see here.   Lantus SoloStar 100 UNIT/ML Solostar Pen Generic drug: insulin glargine Inject 10 Units into the skin at bedtime.   melatonin 3 MG Tabs tablet Take 6 mg by mouth at bedtime. For Sleep   multivitamin capsule Take 1 capsule by mouth daily.   NEPRO PO Take 1 Can by mouth at bedtime.   sertraline 50 MG tablet Commonly known as: ZOLOFT Take 50 mg by mouth daily.            Discharge Care Instructions  (From admission, onward)         Start  Ordered   07/17/20 0000  Discharge  wound care:       Comments: Keep dressing dry--- change as needed if wet or soiled   07/17/20 1543          Major procedures and Radiology Reports - PLEASE review detailed and final reports for all details, in brief -   DG Chest 1 View  Result Date: 07/11/2020 CLINICAL DATA:  Pain following fall EXAM: CHEST  1 VIEW COMPARISON:  November 22, 2017 FINDINGS: Central catheter tip is at the cavoatrial junction. No pneumothorax. There is slight left base atelectasis. Lungs elsewhere clear. Heart is upper normal in size with pulmonary vascularity normal. There is aortic atherosclerosis. No adenopathy. No bone lesions. There are surgical clips in the right axilla. IMPRESSION: Central catheter tip at cavoatrial junction. No pneumothorax. Mild left base atelectasis. No edema or airspace opacity. Stable cardiac silhouette. Aortic Atherosclerosis (ICD10-I70.0). Electronically Signed   By: Lowella Grip III M.D.   On: 07/11/2020 15:14   DG Pelvis 1-2 Views  Result Date: 07/12/2020 CLINICAL DATA:  The patient suffered a right hip fracture in a fall 07/11/2020. Initial encounter. EXAM: PELVIS - 1-2 VIEW COMPARISON:  Plain films right hip 07/11/2020. FINDINGS: New intramedullary nail and hip screw are in place for fixation of a right hip fracture. Hardware is intact. The intramedullary nail is incompletely imaged. Position and alignment are near anatomic. No acute abnormality. IMPRESSION: Status post fixation right hip fracture.  No acute finding. Electronically Signed   By: Inge Rise M.D.   On: 07/12/2020 15:07   DG Knee 1-2 Views Right  Result Date: 07/11/2020 CLINICAL DATA:  Recent fall with right knee pain, initial encounter EXAM: RIGHT KNEE - 2 VIEW COMPARISON:  None. FINDINGS: No acute fracture or dislocation is noted. Diffuse vascular calcifications are seen. No soft tissue abnormality is noted. IMPRESSION: No acute abnormality noted. Electronically Signed   By: Inez Catalina M.D.   On: 07/11/2020  15:13   PERIPHERAL VASCULAR CATHETERIZATION  Result Date: 07/05/2020 Procedure: Right arm fistulogram, angioplasty right subclavian innominate vein superior vena cava Preoperative diagnosis: Venous hypertension right arm Postoperative diagnosis: Same Anesthesia: Local Operative findings: 1.  80% stenosis right subclavian and innominate and superior vena cava angioplastied up to a 10 mm balloon with complete elastic recoil and no significant improvement Operative details: After obtaining informed consent, the patient was taken the PV lab.  The patient was placed in supine position the angio table.  Patient's right upper extremities prepped and draped in usual sterile fashion.  Local anesthesia was infiltrated over the right upper arm AV fistula.  Micropuncture needle was used to cannulate the right arm AV fistula under ultrasound guidance.  Micropuncture wire was advanced into the fistula.  Micropuncture sheath was placed over this.  Right arm fistulogram and central venogram was then performed through the sheath.  The superior vena cava innominate vein and subclavian veins on the right side are all patent.  However there were multiple segments areas of narrowing up to 80%.  There are abundant collaterals around the shoulder.  The remainder of the AV fistula is widely patent.  Compression on the outflow to reflux contrast across the arterial anastomosis showed this was widely patent as well. At this point the sheath was accessed with an 035 versa core wire and advanced across the central vein stenosis into the right atrium.  This was assisted with a KMP catheter.  A 7 French sheath was then placed over the wire.  Patient was given 3000 units of intravenous heparin.  I then angioplastied 3 discrete lesions in the right subclavian right innominate and superior vena cava utilizing multiple inflations of a 10 x 40 balloon.  Due to the recoil nature of the vein the balloon Sliding forward.  Therefore I swapped this  out for a 1080 balloon.  This was then inflated to 8 atm for 1 minute.  Completion angiogram really showed no significant improvement and complete Recoil of the narrowing.  A similar procedure had been performed approximately 30 days ago.  In light of this I felt my options were to either stent her central vein potentially encroaching upon the left innominate system and jailing the right internal jugular vein or abandoning this in favor of placing a thigh graft in the near future.  I opted not to stent the central vein segment. At this point the guidewire was removed the sheath was removed and the fistula opening closed with a single figure-of-eight Monocryl stitch. Operative management: The patient has had previous access procedures which failed in the left arm.  She now has venous hypertension and multiple areas of narrowing in the right innominate superior vena cava on the right side.  I do not believe we are going to obtain significant durability of her right arm fistula even if we stent this segment.  We will schedule the patient for outpatient follow-up for consideration of a thigh graft. Ruta Hinds, MD [facility] Office: [phone]   DG Chest Port 1 View  Result Date: 07/14/2020 CLINICAL DATA:  Short of breath EXAM: PORTABLE CHEST 1 VIEW COMPARISON:  Prior chest x-ray 07/11/2020 FINDINGS: Left IJ approach tunneled hemodialysis catheter. Catheter tip overlies the superior cavoatrial junction. Stable cardiac and mediastinal contours. Atherosclerotic calcifications again visualized in the transverse aorta. Increased pulmonary vascular congestion. Chronic elevation of the left hemidiaphragm with left basilar atelectasis. No pneumothorax. No acute osseous abnormality. IMPRESSION: 1. Increased pulmonary vascular congestion suggests either mild CHF or fluid overload. 2. Stable and well-positioned left IJ tunneled hemodialysis catheter. Electronically Signed   By: Jacqulynn Cadet M.D.   On: 07/14/2020 11:08   VAS US DUPLEX DIALYSIS ACCESS (AVF,AVG)  Result Date: 06/20/2020 DIALYSIS ACCESS Reason for Exam: Routine follow up. Access Site: Right Upper Extremity. Access Type: Brachial-cephalic AVF placed 5/46/2703. History: History of left brachiocephalic AVF. Performing Technologist: Alvia Grove RVT  Examination Guidelines: A complete evaluation includes B-mode imaging, spectral Doppler, color Doppler, and power Doppler as needed of all accessible portions of each vessel. Unilateral testing is considered an integral part of a complete examination. Limited examinations for reoccurring indications may be performed as noted.  Findings: +--------------------+----------+-----------------+--------+ AVF                 PSV (cm/s)Flow Vol (mL/min)Comments +--------------------+----------+-----------------+--------+ Native artery inflow   161           901                +--------------------+----------+-----------------+--------+ AVF Anastomosis        327                              +--------------------+----------+-----------------+--------+  +------------+----------+-------------+-----------+----------------------------+ OUTFLOW VEINPSV (cm/s)Diameter (cm)Depth (cm)           Describe           +------------+----------+-------------+-----------+----------------------------+ Prox UA         79  0.58        0.86    competing branch 41cm/s 0.30                                                          cm dia            +------------+----------+-------------+-----------+----------------------------+ Mid UA         127        0.59        1.02                                 +------------+----------+-------------+-----------+----------------------------+ Dist UA     138 / 154     0.62        0.53     tortuous, slight narrowing  +------------+----------+-------------+-----------+----------------------------+ AC Fossa    161 / 634  1.08 /  0.31 0.26 / 0.57 sharp curve into brachial   +------------+----------+-------------+-----------+----------------------------+   Summary: Patent arteriovenous fistula with sharp curve in the antecubital fossa entering Brachial artery with increased velocity. . *See table(s) above for measurements and observations.  Diagnosing physician: Ruta Hinds MD Electronically signed by Ruta Hinds MD on 06/20/2020 at 2:20:22 PM.    --------------------------------------------------------------------------------   Final    DG HIP OPERATIVE UNILAT WITH PELVIS RIGHT  Result Date: 07/12/2020 CLINICAL DATA:  Hip fracture EXAM: OPERATIVE right HIP (WITH PELVIS IF PERFORMED) 14 VIEWS TECHNIQUE: Fluoroscopic spot image(s) were submitted for interpretation post-operatively. COMPARISON:  07/11/2020 FINDINGS: Fourteen low resolution intraoperative spot views of the right hip. Total fluoroscopy time was 1 minutes 50 seconds. The images demonstrate basicervical/intertrochanteric fracture with subsequent intramedullary rodding and distal screw fixation with anatomic alignment. IMPRESSION: Intraoperative fluoroscopic assistance provided during surgical fixation of proximal right femur fracture. Electronically Signed   By: Donavan Foil M.D.   On: 07/12/2020 16:02   DG Hip Unilat  With Pelvis 2-3 Views Right  Result Date: 07/11/2020 CLINICAL DATA:  Fall, right hip deformity EXAM: DG HIP (WITH OR WITHOUT PELVIS) 2-3V RIGHT COMPARISON:  None. FINDINGS: Single view radiograph pelvis and two view radiograph of the right hip demonstrates an acute, mildly impacted, angulated basicervical right femoral neck fracture with mild posterior and moderate varus angulation. The femoral head is still seated within the right acetabulum. Mild-to-moderate degenerative hip arthritis is present with joint space narrowing noted bilaterally. The pelvis and left hip are intact. Multiple involuted fibroids are seen within the pelvis. Advanced vascular  calcifications are seen within the pelvis and medial thighs. IMPRESSION: Acute, impacted, angulated right basicervical femoral neck fracture. Electronically Signed   By: Fidela Salisbury MD   On: 07/11/2020 15:14   DG FEMUR PORT, MIN 2 VIEWS RIGHT  Result Date: 07/12/2020 CLINICAL DATA:  Postop femoral ORIF. EXAM: RIGHT FEMUR PORTABLE 2 VIEW COMPARISON:  Intraoperative views earlier the same date. Radiographs 07/11/2020. FINDINGS: Status post right femoral dynamic screw and intramedullary fixation of the comminuted intertrochanteric femur fracture. There is near anatomic reduction of the fracture. The hardware is well positioned. Lateral skin staples are present superior to the greater trochanter, and there is soft tissue emphysema laterally in the proximal to mid right thigh. No complications identified. Calcified uterine fibroids and femoral atherosclerosis are noted. IMPRESSION: Near anatomic reduction of intertrochanteric right femur fracture  post ORIF. Electronically Signed   By: Richardean Sale M.D.   On: 07/12/2020 15:06    Micro Results    No results found for this or any previous visit (from the past 240 hour(s)).     Today   Subjective    Carly Wood today has no new complaints, -Tolerated hemodialysis well on 07/17/2020      No Nausea, Vomiting or Diarrhea No fever  Or chills       Patient has been seen and examined prior to discharge   Objective   Blood pressure (!) 138/55, pulse 77, temperature 98 F (36.7 C), temperature source Oral, resp. rate (!) 21, height '5\' 3"'  (1.6 m), weight 63.6 kg, SpO2 100 %.   Intake/Output Summary (Last 24 hours) at 07/17/2020 1544 Last data filed at 07/17/2020 1410 Gross per 24 hour  Intake 120 ml  Output 1000 ml  Net -880 ml    Exam Gen:- Awake Alert,  In no apparent distress  HEENT:- [place].AT, No sclera icterus Neck-Supple Neck, HD catheter.  Lungs-fair air movement, no wheezing CV- S1, S2 normal, regular  Abd-  +ve B.Sounds, Abd  Soft, No tenderness,    Extremity/Skin:- No  edema, pedal pulses present  Psych-affect is appropriate, oriented x 3, intermittent episodes of confusion and forgetfulness Neuro-generalized weakness, no new focal deficits, no tremors MSK- Lt UE AVF noted, Rt Hip post op wound is dry/intact   Data Review   CBC w Diff:  Lab Results  Component Value Date   WBC 11.5 (H) 07/17/2020   HGB 9.4 (L) 07/17/2020   HCT 29.8 (L) 07/17/2020   PLT 300 07/17/2020   LYMPHOPCT 11 07/11/2020   MONOPCT 12 07/11/2020   EOSPCT 2 07/11/2020   BASOPCT 0 07/11/2020    CMP:  Lab Results  Component Value Date   NA 135 07/17/2020   K 4.2 07/17/2020   CL 95 (L) 07/17/2020   CO2 26 07/17/2020   BUN 68 (H) 07/17/2020   CREATININE 8.04 (H) 07/17/2020   PROT 6.8 07/11/2020   ALBUMIN 3.5 07/11/2020   BILITOT 0.7 07/11/2020   ALKPHOS 68 07/11/2020   AST 33 07/11/2020   ALT 15 07/11/2020  .   Total Discharge time is about 33 minutes  Roxan Hockey M.D on 07/17/2020 at 3:44 PM  Go to [email or web address] -  for contact info  [facility] - Office  [phone]

## 2020-07-17 NOTE — Progress Notes (Signed)
Physical Therapy Treatment Patient Details Name: Carly Wood MRN: [id number] DOB: Sep 09, 1935 Today's Date: 07/17/2020    History of Present Illness Carly Wood is a 85 y.o. female with medical history significant for multiple myeloma, diabetes mellitus, ESRD, dementia, hypertension gastric ulcer.  Patient presented to the ED from [facility] facility with reports of fall.  Patient is awake alert and oriented and able to give me history.  She reports she was walking she turned around, and lost her balance and fell.  She reports right knee pain.  Patient did not lose consciousness, she did not hit her head. Rt Hip Fx-- Hip Xray on Admission shows impacted right basicervical femoral neck fracture.  S/p Cephalomedullary nail for Right basicervical intertrochanteric femur fracture by Dr Amedeo Kinsman on 07/12/20    PT Comments    Patient demonstrates improvement for sitting up at bedside requiring less assistance to move RLE and good return for scooting up/supporting self with BUE.  Patient limited for completing exercises while seated at bedside due to poor carryover and able to take a few steps to transfer to chair.  Patient tolerated staying up in chair after therapy to have dialysis done.  Patient will benefit from continued physical therapy in hospital and recommended venue below to increase strength, balance, endurance for safe ADLs and gait.   Follow Up Recommendations  SNF     Equipment Recommendations  None recommended by PT    Recommendations for Other Services       Precautions / Restrictions Precautions Precautions: Fall Restrictions Weight Bearing Restrictions: Yes RLE Weight Bearing: Weight bearing as tolerated    Mobility  Bed Mobility Overal bed mobility: Needs Assistance Bed Mobility: Supine to Sit     Supine to sit: Min assist     General bed mobility comments: increased time, labored movement    Transfers Overall transfer level: Needs assistance Equipment used:  Rolling walker (2 wheeled) Transfers: Sit to/from [facility] Sit to Stand: Min assist;Mod assist Stand pivot transfers: Mod assist       General transfer comment: increased BLE strength for completing sit to stands and transferring to chair  Ambulation/Gait Ambulation/Gait assistance: Mod assist Gait Distance (Feet): 4 Feet Assistive device: Rolling walker (2 wheeled) Gait Pattern/deviations: Decreased step length - left;Decreased stance time - right;Decreased stride length;Shuffle Gait velocity: slow   General Gait Details: limited to a few slow labored unsteady side steps with mostly shuffling of RLE   Stairs             Wheelchair Mobility    Modified Rankin (Stroke Patients Only)       Balance Overall balance assessment: Needs assistance Sitting-balance support: Feet supported;No upper extremity supported Sitting balance-Leahy Scale: Good Sitting balance - Comments: seated at EOB   Standing balance support: During functional activity;Bilateral upper extremity supported Standing balance-Leahy Scale: Poor Standing balance comment: fair/poor using RW                            Cognition Arousal/Alertness: Awake/alert Behavior During Therapy: WFL for tasks assessed/performed Overall Cognitive Status: No family/caregiver present to determine baseline cognitive functioning                                        Exercises General Exercises - Lower Extremity Heel Raises: Seated;AROM;Strengthening;Both;10 reps    General Comments  Pertinent Vitals/Pain Pain Assessment: Faces Faces Pain Scale: Hurts a little bit Pain Location: right hip with movement Pain Descriptors / Indicators: Grimacing;Sore Pain Intervention(s): Limited activity within patient's tolerance;Monitored during session;Repositioned    Home Living                      Prior Function            PT Goals (current goals can now  be found in the care plan section) Acute Rehab PT Goals Patient Stated Goal: return to SNF PT Goal Formulation: With patient Time For Goal Achievement: 07/28/20 Potential to Achieve Goals: Good Progress towards PT goals: Progressing toward goals    Frequency    Min 4X/week      PT Plan Current plan remains appropriate    Co-evaluation              AM-PAC PT "6 Clicks" Mobility   Outcome Measure  Help needed turning from your back to your side while in a flat bed without using bedrails?: A Little Help needed moving from lying on your back to sitting on the side of a flat bed without using bedrails?: A Little Help needed moving to and from a bed to a chair (including a wheelchair)?: A Lot Help needed standing up from a chair using your arms (e.g., wheelchair or bedside chair)?: A Lot Help needed to walk in hospital room?: A Lot Help needed climbing 3-5 steps with a railing? : Total 6 Click Score: 13    End of Session   Activity Tolerance: Patient tolerated treatment well;Patient limited by fatigue Patient left: in chair;with call bell/phone within reach Nurse Communication: Mobility status PT Visit Diagnosis: Unsteadiness on feet (R26.81);Other abnormalities of gait and mobility (R26.89);Muscle weakness (generalized) (M62.81)     Time: 9150-4136 PT Time Calculation (min) (ACUTE ONLY): 28 min  Charges:  $Therapeutic Activity: 23-37 mins                     3:38 PM, 07/17/20 Lonell Grandchild, MPT Physical Therapist with [facility] [phone] office [phone] mobile phone

## 2020-07-17 NOTE — Progress Notes (Signed)
Pt back to room via recliner chair post-dialysis tx. VSS, Pt denies c/o. Pt given lunch tray and is feeding self at this time.

## 2020-07-17 NOTE — TOC Transition Note (Signed)
Transition of Care [facility]) - CM/SW Discharge Note   Patient Details  Name: Carly Wood MRN: [id number] Date of Birth: 30-Nov-1935  [facility] CM/SW Contact:  Ihor Gully, LCSW Phone Number: 07/17/2020, 4:01 PM   Clinical Narrative:    Discharge clinicals sent to facility. Sister, Deneise Lever, notified. TOC signing off.   Final next level of care: Skilled Nursing Facility Barriers to Discharge: No Barriers Identified   Patient Goals and CMS Choice Patient states their goals for this hospitalization and ongoing recovery are:: Rehab with SNF CMS Medicare.gov Compare Post Acute Care list provided to:: Patient Choice offered to / list presented to : Patient  Discharge Placement                Patient to be transferred to facility by: staff Name of family member notified: sister, annie Patient and family notified of of transfer: 07/17/20  Discharge Plan and Services In-house Referral: NA Discharge Planning Services: NA Post Acute Care Choice: NA          DME Arranged: N/A DME Agency: NA       HH Arranged: NA HH Agency: NA        Social Determinants of Health (SDOH) Interventions     Readmission Risk Interventions Readmission Risk Prevention Plan 07/14/2020  Transportation Screening Complete  Medication Review Press photographer) Complete  PCP or Specialist appointment within 3-5 days of discharge Complete  HRI or Home Care Consult Complete  SW Recovery Care/Counseling Consult Complete  Palliative Care Screening Not Applicable  Skilled Nursing Facility Complete  Some recent data might be hidden

## 2020-07-17 NOTE — Progress Notes (Signed)
Patient was transported from [place] [address] in wheelchair to the [facility] by nursing staff. CNA at the [facility] received patient. Patients belongings sent with patient. Discharge packet sent with patient to be given to patients nurse at [facility]. Patient stable upon leaving.

## 2020-07-18 ENCOUNTER — Other Ambulatory Visit: Payer: Self-pay | Admitting: Adult Health

## 2020-07-18 ENCOUNTER — Non-Acute Institutional Stay (SKILLED_NURSING_FACILITY): Payer: Medicare PPO | Admitting: Adult Health

## 2020-07-18 ENCOUNTER — Encounter: Payer: Self-pay | Admitting: Adult Health

## 2020-07-18 DIAGNOSIS — F339 Major depressive disorder, recurrent, unspecified: Secondary | ICD-10-CM | POA: Insufficient documentation

## 2020-07-18 DIAGNOSIS — S72001S Fracture of unspecified part of neck of right femur, sequela: Secondary | ICD-10-CM

## 2020-07-18 DIAGNOSIS — G8918 Other acute postprocedural pain: Secondary | ICD-10-CM

## 2020-07-18 MED ORDER — OXYCODONE HCL 5 MG PO TABS: 5.0000 mg | ORAL_TABLET | Freq: Four times a day (QID) | ORAL | 0 refills | Status: DC | PRN

## 2020-07-18 NOTE — Progress Notes (Signed)
Location:  [place] Room Number: 148-D Place of Service:  SNF (31)   CODE STATUS: Full Code  Allergies  Allergen Reactions  . Ace Inhibitors Other (See Comments) and Cough    Tongue swell  . Penicillins Other (See Comments)    Unsure of reaction Has patient had a PCN reaction causing immediate rash, facial/tongue/throat swelling, SOB or lightheadedness with hypotension: Unknown Has patient had a PCN reaction causing severe rash involving mucus membranes or skin necrosis: Unknown Has patient had a PCN reaction that required hospitalization: No Has patient had a PCN reaction occurring within the last 10 years: Unknown If all of the above answers are "NO", then may proceed with Cephalosporin use.      Chief Complaint  Patient presents with  . Acute Visit    Pain management    HPI:  She is a 85 year old woman being seen for her pain management. She has suffered a right hip fracture with IM nail. She is having worsening pain and is unable to participate in therapy. She is without pain when she is not moving.   Past Medical History:  Diagnosis Date  . Anemia   . Anxiety   . Chronic kidney disease   . Chronic renal disease, stage 4, severely decreased glomerular filtration rate (GFR) between 15-29 mL/min/1.73 square meter (HCC) 08/22/2015  . Complication of anesthesia    delirious after Breast Surgery  . Dementia ([place])    mild  . Depression   . Diabetes mellitus without complication ([place])    type II  . Dyspnea    with activity  . GERD (gastroesophageal reflux disease)   . Glaucoma   . Hypertension   . Pneumonia   . Stage 1 infiltrating ductal carcinoma of right female breast ([place]) 08/21/2015   ER+ PR+ HER 2 neu + (3+) T1cN0     Past Surgical History:  Procedure Laterality Date  . A/V FISTULAGRAM Right 07/05/2020   Procedure: A/V Fistulagram;  Surgeon: Elam Dutch, MD;  Location: [facility];  Service: Cardiovascular;  Laterality:  Right;  . AV FISTULA PLACEMENT Left 11/22/2017   Procedure: ARTERIOVENOUS (AV) FISTULA CREATION LEFT ARM;  Surgeon: Elam Dutch, MD;  Location: [facility];  Service: Vascular;  Laterality: Left;  . AV FISTULA PLACEMENT Right 04/04/2020   Procedure: RIGHT ARM ARTERIOVENOUS FISTULA CREATION;  Surgeon: Rosetta Posner, MD;  Location: [facility] ORS;  Service: Vascular;  Laterality: Right;  . BIOPSY  08/07/2016   Procedure: BIOPSY;  Surgeon: Daneil Dolin, MD;  Location: [facility];  Service: Endoscopy;;  gastric ulcer biopsy  . COLONOSCOPY    . ESOPHAGOGASTRODUODENOSCOPY N/A 08/07/2016   Procedure: ESOPHAGOGASTRODUODENOSCOPY (EGD);  Surgeon: Daneil Dolin, MD;  [facility];  Service: Endoscopy;  Laterality: N/A;  1215-rescheduled to 6/1 @ 2:30pm per Tretha Sciara  . ESOPHAGOGASTRODUODENOSCOPY N/A 11/27/2016   Procedure: ESOPHAGOGASTRODUODENOSCOPY (EGD);  Surgeon: Daneil Dolin, MD;  [facility];  Service: Endoscopy;  Laterality: N/A;  8:15am  . FISTULA SUPERFICIALIZATION Left 02/14/2018   Procedure: FISTULA SUPERFICIALIZATION LEFT ARM;  Surgeon: Angelia Mould, MD;  Location: [facility];  Service: Vascular;  Laterality: Left;  . FRACTURE SURGERY Right    ankle  . INTRAMEDULLARY (IM) NAIL INTERTROCHANTERIC Right 07/12/2020   Procedure: INTRAMEDULLARY (IM) NAIL INTERTROCHANTRIC;  Surgeon: Mordecai Rasmussen, MD;  Location: [facility];  Service: Orthopedics;  Laterality: Right;  . MALONEY DILATION N/A 08/07/2016   Procedure: MALONEY DILATION;  Surgeon: Daneil Dolin, MD;  [facility];  Service: Endoscopy;  Laterality: N/A;  . MASTECTOMY, PARTIAL Right   . PERIPHERAL VASCULAR BALLOON ANGIOPLASTY Left 07/13/2019   Procedure: PERIPHERAL VASCULAR BALLOON ANGIOPLASTY;  Surgeon: Marty Heck, MD;  Location: [facility];  Service: Cardiovascular;  Laterality: Left;  arm fistulogram  . PERIPHERAL VASCULAR BALLOON ANGIOPLASTY Right 05/22/2020   Procedure: PERIPHERAL VASCULAR  BALLOON ANGIOPLASTY;  Surgeon: Cherre Robins, MD;  Location: [facility];  Service: Cardiovascular;  Laterality: Right;  arm fistula  . PERIPHERAL VASCULAR BALLOON ANGIOPLASTY Right 07/05/2020   Procedure: PERIPHERAL VASCULAR BALLOON ANGIOPLASTY;  Surgeon: Elam Dutch, MD;  Location: [facility] [facility];  Service: Cardiovascular;  Laterality: Right;  arm fistula  . PORT-A-CATH REMOVAL Left 11/22/2017   Procedure: REMOVAL PORT-A-CATH LEFT CHEST;  Surgeon: Elam Dutch, MD;  Location: [facility] OR;  Service: Vascular;  Laterality: Left;  . RETINAL DETACHMENT SURGERY Right     Social History   Socioeconomic History  . Marital status: Single    Spouse name: Not on file  . Number of children: Not on file  . Years of education: Not on file  . Highest education level: Not on file  Occupational History  . Occupation: retired   Tobacco Use  . Smoking status: Never Smoker  . Smokeless tobacco: Never Used  Vaping Use  . Vaping Use: Never used  Substance and Sexual Activity  . Alcohol use: No    Alcohol/week: 0.0 standard drinks  . Drug use: No  . Sexual activity: Never  Other Topics Concern  . Not on file  Social History Narrative   Long term resident of SNF    Social Determinants of Health   Financial Resource Strain: Low Risk   . Difficulty of Paying Living Expenses: Not very hard  Food Insecurity: No Food Insecurity  . Worried About Charity fundraiser in the Last Year: Never true  . Ran Out of Food in the Last Year: Never true  Transportation Needs: No Transportation Needs  . Lack of Transportation (Medical): No  . Lack of Transportation (Non-Medical): No  Physical Activity: Inactive  . Days of Exercise per Week: 0 days  . Minutes of Exercise per Session: 0 min  Stress: No Stress Concern Present  . Feeling of Stress : Not at all  Social Connections: Moderately Isolated  . Frequency of Communication with Friends and Family: More than three times a week  .  Frequency of Social Gatherings with Friends and Family: Once a week  . Attends Religious Services: More than 4 times per year  . Active Member of Clubs or Organizations: No  . Attends Archivist Meetings: Never  . Marital Status: Never married  Intimate Partner Violence: Not At Risk  . Fear of Current or Ex-Partner: No  . Emotionally Abused: No  . Physically Abused: No  . Sexually Abused: No   Family History  Problem Relation Age of Onset  . Multiple myeloma Sister   . Brain cancer Sister   . Dementia Mother        died at 52  . Stroke Mother   . Heart failure Mother   . Diabetes Mother   . Heart disease Father   . Prostate cancer Brother   . Colon cancer Neg Hx       VITAL SIGNS BP 134/76   Pulse 70   Temp 97.9 F (36.6 C)   Resp 20  Ht '5\' 3"'  (1.6 m)   Wt 142 lb 3.2 oz (64.5 kg)   SpO2 96%   BMI 25.19 kg/m   Outpatient Encounter Medications as of 07/18/2020  Medication Sig  . acetaminophen (TYLENOL) 650 MG CR tablet Take 650 mg by mouth every 6 (six) hours.  Marland Kitchen acyclovir (ZOVIRAX) 200 MG capsule Take 200 mg by mouth daily.  Marland Kitchen amLODipine (NORVASC) 10 MG tablet Take 10 mg by mouth daily. Not taking on dialysis days  . anastrozole (ARIMIDEX) 1 MG tablet TAKE 1 TABLET BY MOUTH DAILY (Patient taking differently: Take 1 mg by mouth daily.)  . aspirin EC 81 MG EC tablet Take 1 tablet (81 mg total) by mouth 2 (two) times daily. Swallow whole.  Marland Kitchen atorvastatin (LIPITOR) 10 MG tablet Take 10 mg by mouth at bedtime.  . Calcium Acetate 667 MG TABS Take 667 mg by mouth daily. Take 1 tablet w/ snacks daily  . Calcium Acetate 667 MG TABS Take 667 mg by mouth 2 (two) times daily. Take 2 tablets w/ meals  . calcium-vitamin D (OSCAL WITH D) 500-200 MG-UNIT tablet Take 1 tablet by mouth daily. At 8 am  . cloNIDine (CATAPRES) 0.3 MG tablet Take 0.3 mg by mouth 2 (two) times daily.   . insulin glargine (LANTUS SOLOSTAR) 100 UNIT/ML Solostar Pen Inject 10 Units into the skin at  bedtime.  . insulin lispro (HUMALOG) 100 UNIT/ML KwikPen Inject 100 Units into the skin. 10 units, subcutaneous, Twice A Day, Hold for accu-check less than 150. Do not give more than 10 minutes before eatingINJECT 10 UNITS SUBCUTANEOUSLY TWICE DAILY *HOLD FOR ACCU-CHECK LESS THAN 150. DO NOT GIVE MORE THAN 10 MINUTES BEFORE EATING* *EXPIRES 28 DAYS AFTER OPENING* *CHECK EXPIRATION DATE* *AFTER INJECTION, KEEP NEEDLE IN SKIN FOR AT LEAST 5 SECONDS AFTER THE COUNTER SHOWS ZERO* **USE WITHIN 28 DAYS OF 1ST USE, DO NOT REFRIGERATE IF IN USE* (FORMULARY SUB FOR NOVOLOG FLEXPEN);INJECT 15 UNITS SUBQ ONCE DAILY *HOLD FOR ACCU-CHECK LESS THAN 150 DO NOT GIVE MORE THAN 10 MINUTES BEFORE EATING* *PRIME WITH 2 UNITS BEFORE EACH INJECTION* *AFTER INJECTION, KEEP NEEDLE IN SKIN FOR AT LEAST 5 SECONDS AFTER THE COUNTER SHOWS ZERO* **USE WITHIN 28 DAYS OF 1ST USE, DO NOT REFRIGERATE IF IN USE* (FORMULARY SUB FOR NOVOLOG FLEXPEN)  . insulin lispro (HUMALOG) 100 UNIT/ML KwikPen Inject 100 Units into the skin. 15 units, subcutaneous, Once A Day, INJECT 15 UNITS SUBCUTANEOUSLY TWICE DAILY *HOLD FOR ACCU-CHECK LESS THAN 150. DO NOT GIVE MORE THAN 10 MINUTES BEFORE EATING* *EXPIRES 28 DAYS AFTER OPENING* *  . melatonin 3 MG TABS tablet Take 6 mg by mouth at bedtime. For Sleep  . Multiple Vitamin (MULTIVITAMIN) capsule Take 1 capsule by mouth daily.  . NON FORMULARY Diet- renal carb modified 1200cc fluid restriction  . sertraline (ZOLOFT) 50 MG tablet Take 50 mg by mouth daily.  . [DISCONTINUED] calcium acetate (PHOSLO) 667 MG capsule Take 667-1,334 mg by mouth See admin instructions. Take 1,334 mg twice daily with meals and 667 with snacks daily  . [DISCONTINUED] insulin aspart (NOVOLOG) 100 UNIT/ML injection Inject 10-15 Units into the skin 3 (three) times daily before meals. Take 10 units with breakfast and supper  GIVE 15 units with lunch  . [DISCONTINUED] Nutritional Supplements (NEPRO PO) Take 1 Can by mouth at bedtime.  (Patient not taking: Reported on 07/11/2020)  . [DISCONTINUED] oxyCODONE (ROXICODONE) 5 MG immediate release tablet Take 1 tablet (5 mg total) by mouth every 6 (six) hours as needed  for up to 7 days for severe pain.   No facility-administered encounter medications on file as of 07/18/2020.     SIGNIFICANT DIAGNOSTIC EXAMS  PREVIOUS   07-11-20: chest x-ray:  Central catheter tip at cavoatrial junction. No pneumothorax. Mild left base atelectasis. No edema or airspace opacity. Stable cardiac silhouette. Aortic Atherosclerosis   07-11-20: right knee x-ray:  No acute fracture or dislocation is noted. Diffuse vascular calcifications are seen. No soft tissue abnormality is noted.  07-11-20: right hip x-ray:  Acute, impacted, angulated right basicervical femoral neck fracture.   NO NEW EXAMS.    LABS REVIEWED PREVIOUS  10-13-19: hgb a1c 6.0; vit B 12: 1126 folate 20.9 10-17-19: wbc 8.8; hgb 8.3; hct 26.1; mcv 114.0 plt 270; glucose 190; bun 28; creat 4.85; k+ 3.9; na++ 127; ca 8.9 liver normal albumin 4.0  10-31-19: wbc 6.0; hgb 8.8; hct 28.6; mcv 117.2 plt 326; glucose 188; bun 45; creat 6.28; k+ 3.9; na++ 137;ca 9.1 liver normal albumin 3.9 11-07-19: wbc 6.1; hgb 9.2; hct 29.4 mcv 115.7 plt 276; glucose 201; bun 44; creat 5.93; k+ 4.5; na++ 136; ca 9.1 liver normal albumin 3.8 11-14-19; wbc 8.3; hgb 9.7; hct 31.2 mcv 116.4 plt 261; glucose 198; bun 44; creat 6.66; k+ 5.1; na++ 138; ca 8.8 liver normal albumin 3.9   12-12-19: wbc 7.6; hgb 11.3; hct 35.6; mcv 109.9 plt 212; glucose 602; bun 67; creat 8.62; k+ 5.6; na++ 135; ca 7.9 liver normal albumin 3.5  12-20-19: hgb a1c 8.0  01-09-20: wbc 6.9; hgb 12.1; hct 39.7; mcv 105.6 plt 299; glucose 225; bun 112; creat 14.53; k+ 6.4; na++ 132; ca 8.3 liver normal albumin 3.5  01-23-20: k+ 6.6  01-23-20: wbc 7.9; hgb 11.6; hct 36.9; mcv 103.7 plt 359; glucose 139; bun 109; creat 15.46; k+ 6.1; na++ 140; ca 8.7 liver normal albumin 4.1;  01-24-20: k+ 4.4   01-30-20: wbc 10.6; hgb 9.7; hct 32.0; mcv 103.6 plt 390; glucose 164; bun 44; creat 9.08; k+ 4.1; na++ 139; ca 9.0 liver normal albumin 3.3 02-06-20: wbc 7.8; hgb 9.9; hct 32.7 mcv 104.8 plt 344; glucose 211; bun 90; creat 10.97; k+ 6.0 na++ 137; ca 8.8 liver normal albumin 3.4  03-26-20: wbc 6.3; hgb 9.4; hct 30.4; mcv 104.1; plt 300; glucose 223; bun 101; creat 14.22; k+ 5.8; na++ 135; ca 8.1 GFR 2; liver normal albumin 3.4  05-21-20; wbc 5.1; hgb 10.4; hct 32.8; mcv 106.8 plt 303; glucose 186; bun 41; creat 1.54; k+ 5.0; na++ 132; ca 8.4 GFR 5; liver normal albumin 3.7 06-13-20: chol 189; ldl 106; trig 211; hdl 41; hgb a1c 6.2   07-11-20: wbc 9.1; hgb 10.6; hct 34.6; mcv 109.1 plt 248; glucose 79; bun 45; creat 7.71 k+ 4.9; na++ 134; ca 8.1 GFR 5; liver normal albumin 3.5 mag 2.4 07-13-20: wbc 11.4; hgb 8.9; hct 29.7; mcv 108.8 plt 303; glucose 178; bun 66; creat 11.17; k+ 6.6; na++ 135; ca 8.2; GFR 3 07-15-20: wbc 11.0; hgb 7.5; hct 25.2; mcv 108.2 plt 251; glucose 117; bun 60; creat 9.41; k+ 5.5; na++ 138; ca 8.4 GFR 4 07-17-20: wbc 11.5; hgb 9.4; hct 29.8; mcv 100.3 plt 300; glucose 140; bun 68; creat 8.04; k+ 4.2; na++ 135; ca 8.5 GFR 5  NO NEW LABS.     Review of Systems  Constitutional: Negative for malaise/fatigue.  Respiratory: Negative for cough and shortness of breath.   Cardiovascular: Negative for chest pain, palpitations and leg swelling.  Gastrointestinal: Negative for abdominal  pain, constipation and heartburn.  Musculoskeletal: Positive for joint pain. Negative for back pain and myalgias.       Right hip pain   Skin: Negative.   Neurological: Negative for dizziness.  Psychiatric/Behavioral: The patient is not nervous/anxious.     Physical Exam Constitutional:      General: She is not in acute distress.    Appearance: She is well-developed. She is not diaphoretic.  Neck:     Thyroid: No thyromegaly.  Cardiovascular:     Rate and Rhythm: Normal rate and regular rhythm.      Pulses: Normal pulses.     Heart sounds: Normal heart sounds.  Pulmonary:     Effort: Pulmonary effort is normal. No respiratory distress.     Breath sounds: Normal breath sounds.  Abdominal:     General: Bowel sounds are normal. There is no distension.     Palpations: Abdomen is soft.     Tenderness: There is no abdominal tenderness.  Musculoskeletal:     Cervical back: Neck supple.     Right lower leg: No edema.     Left lower leg: No edema.     Comments:  Left neck dialysis access Right arm ace wrap on     Right hip IM nail 07-12-20    Lymphadenopathy:     Cervical: No cervical adenopathy.  Skin:    General: Skin is warm and dry.     Comments: : Incision line without signs of infection present.    Neurological:     Mental Status: She is alert. Mental status is at baseline.  Psychiatric:        Mood and Affect: Mood normal.      ASSESSMENT/ PLAN:  TODAY  1. Closed right hip fracture sequela:  Her pain is worse Will begin oxycodone 5 mg every 6 hours as needed through 07-25-20   Ok Edwards NP [facility]  Contact [phone] Monday through Friday 8am- 5pm  After hours call [phone]

## 2020-07-19 ENCOUNTER — Non-Acute Institutional Stay (SKILLED_NURSING_FACILITY): Payer: Medicare PPO | Admitting: Internal Medicine

## 2020-07-19 ENCOUNTER — Encounter: Payer: Self-pay | Admitting: Internal Medicine

## 2020-07-19 DIAGNOSIS — S72001S Fracture of unspecified part of neck of right femur, sequela: Secondary | ICD-10-CM | POA: Diagnosis not present

## 2020-07-19 DIAGNOSIS — N186 End stage renal disease: Secondary | ICD-10-CM

## 2020-07-19 DIAGNOSIS — Z992 Dependence on renal dialysis: Secondary | ICD-10-CM | POA: Diagnosis not present

## 2020-07-19 DIAGNOSIS — E1122 Type 2 diabetes mellitus with diabetic chronic kidney disease: Secondary | ICD-10-CM

## 2020-07-19 DIAGNOSIS — D509 Iron deficiency anemia, unspecified: Secondary | ICD-10-CM | POA: Diagnosis not present

## 2020-07-19 DIAGNOSIS — Z23 Encounter for immunization: Secondary | ICD-10-CM | POA: Diagnosis not present

## 2020-07-19 NOTE — Patient Instructions (Signed)
See assessment and plan under each diagnosis in the problem list and acutely for this visit 

## 2020-07-19 NOTE — Assessment & Plan Note (Addendum)
Glucoses at the SNF range from 70 up to 265.  Insulin adjustment based initially on FBS with subsequent adjustment of AC insulin dosages.  Discordance between A1c and random glucoses probable due to ESRD.

## 2020-07-19 NOTE — Assessment & Plan Note (Signed)
HD Monday, Wednesday, and Friday.  Initially she declined to go to dialysis 5/13 describing diffuse soreness and hip pain.  She relented after discussion of the complications of failing to comply with regular hemodialysis.

## 2020-07-19 NOTE — Assessment & Plan Note (Addendum)
Current H/H 9.4/29.8 with macrocytosis. B12 was supranormal in 10/2019. No active bleeding dyscrasias reported.

## 2020-07-19 NOTE — Progress Notes (Addendum)
NURSING HOME LOCATION: [facility] ROOM NUMBER:  [id number]  CODE STATUS:  Full Code  PCP:  Bard Herbert NP, [place]  This is a nursing facility follow up visit for [place] readmission within 30 days Interim medical record and care since last SNF visit was updated with review of diagnostic studies and change in clinical status since last visit were documented.  HPI: Patient was hospitalized 5/5 - 07/17/2020 after falling at the SNF and sustaining a closed right hip fracture.  The patient reported that she had gone to the door to inquire about activities; when she turned she became dizzy and fell to the floor.  She did state that her vision was slightly out of focus but she denied any definitive neuro or cardiac prodrome prior to the fall. Cephalomedullary nailing for the right basicervical intertrochanteric femur fracture was performed by Dr. Amedeo Kinsman on 5/6. Postoperatively she was to be WBAT; DVT prophylaxis was aspirin 81 mg twice daily. At discharge creatinine was 8.04 and GFR 5.  Dialysis was to be continued 3 times a week. As expected chronic anemia was present with H/H of 9.4/29.8 with macrocytosis. She will be seen approximately 2 weeks post discharge for orthopedic follow-up for incision check and imaging.  The option was given for staples to be removed at the facility with a subsequent follow-up orthopedically 6 weeks postop. Hemodialysis was to be continued Monday, Wednesday, and Friday.  Significant past medical history includes end-stage renal disease in the setting of diabetes; stage I infiltrating ductal carcinoma on ongoing oral chemotherapy; history of multiple myeloma; anemia of chronic disease with macrocytosis; and vascular dementia.  Review of systems: Dementia invalidated responses. Date given as "April ?, 2022".  She identified the POTUS.  She was not initially sure why she had been in the hospital but then stated "broken hip".  She could not give me the  date of that event.  Other than diffuse soreness and hip pain review of systems was negative. 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  Constitutional: No fever, significant weight change, fatigue  Eyes: No redness, discharge, pain, vision change ENT/mouth: No nasal congestion,  purulent discharge, earache, change in hearing, sore throat  Cardiovascular: No chest pain, palpitations, paroxysmal nocturnal dyspnea, claudication, edema  Respiratory: No cough, sputum production, hemoptysis, DOE, significant snoring, apnea   Gastrointestinal: No heartburn, dysphagia, abdominal pain, nausea /vomiting, rectal bleeding, melena, change in bowels Genitourinary: No dysuria, hematuria, pyuria, incontinence, nocturia Dermatologic: No rash, pruritus, change in appearance of skin Neurologic: No dizziness, headache, syncope, seizures, numbness, tingling Psychiatric: No significant anxiety, depression, insomnia, anorexia Endocrine: No change in hair/skin/nails, excessive thirst, excessive hunger, excessive urination  Hematologic/lymphatic: No significant bruising, lymphadenopathy, abnormal bleeding Allergy/immunology: No itchy/watery eyes, significant sneezing, urticaria, angioedema  Physical exam:  Pertinent or positive findings: Affect is flat and she tended to be somewhat gruff with monosyllabic replies.  Extraocular motion exam reveals decreased lateral rotation of the right eye.  She has scant, patchy hirsutism of the chin.  She has a grade 1.5 systolic murmur loudest at the left base.  Breath sounds are decreased.  Abdomen is protuberant.  Pedal pulses are decreased.  There is an antiwandering anklet at the left ankle.  General appearance: Adequately nourished; no acute distress, increased work of breathing is present.   Lymphatic: No lymphadenopathy about the head, neck, axilla. Eyes: No conjunctival inflammation or lid edema is  present.  There is no scleral icterus. Ears:  External ear exam shows no significant lesions or deformities.   Nose:  External nasal examination shows no deformity or inflammation. Nasal mucosa are pink and moist without lesions, exudates Oral exam:  Lips and gums are healthy appearing. There is no oropharyngeal erythema or exudate. Neck:  No thyromegaly, masses, tenderness noted.    Heart:  Normal rate and regular rhythm. S1 and S2 normal without gallop,click, rub .  Lungs: without wheezes, rhonchi, rales, rubs. Abdomen: Bowel sounds are normal. Abdomen is soft and nontender with no organomegaly, hernias, masses. GU: Deferred  Extremities:  No cyanosis, clubbing, edema  Neurologic exam :Balance, Rhomberg, finger to nose testing could not be completed due to clinical state Skin: Warm & dry w/o tenting. No significant lesions or rash.  See summary under each active problem in the Problem List with associated updated therapeutic plan

## 2020-07-19 NOTE — Assessment & Plan Note (Addendum)
Continuing to have right hip discomfort and initially was declining to go to dialysis 5/13. Continue PT/OT at SNF as tolerated.

## 2020-07-22 ENCOUNTER — Other Ambulatory Visit: Payer: Self-pay

## 2020-07-22 ENCOUNTER — Inpatient Hospital Stay (HOSPITAL_COMMUNITY)
Admission: EM | Admit: 2020-07-22 | Discharge: 2020-07-26 | DRG: 377 | Disposition: A | Discharge status: Skilled Nursing Facility | Payer: Medicare PPO | Attending: Family Medicine | Admitting: Family Medicine

## 2020-07-22 ENCOUNTER — Encounter (HOSPITAL_COMMUNITY): Payer: Self-pay | Admitting: Emergency Medicine

## 2020-07-22 DIAGNOSIS — R58 Hemorrhage, not elsewhere classified: Secondary | ICD-10-CM | POA: Diagnosis not present

## 2020-07-22 DIAGNOSIS — K269 Duodenal ulcer, unspecified as acute or chronic, without hemorrhage or perforation: Secondary | ICD-10-CM

## 2020-07-22 DIAGNOSIS — D649 Anemia, unspecified: Secondary | ICD-10-CM

## 2020-07-22 DIAGNOSIS — Z888 Allergy status to other drugs, medicaments and biological substances status: Secondary | ICD-10-CM | POA: Diagnosis not present

## 2020-07-22 DIAGNOSIS — Z20822 Contact with and (suspected) exposure to covid-19: Secondary | ICD-10-CM | POA: Diagnosis present

## 2020-07-22 DIAGNOSIS — D6481 Anemia due to antineoplastic chemotherapy: Secondary | ICD-10-CM | POA: Diagnosis not present

## 2020-07-22 DIAGNOSIS — B009 Herpesviral infection, unspecified: Secondary | ICD-10-CM | POA: Diagnosis present

## 2020-07-22 DIAGNOSIS — R531 Weakness: Secondary | ICD-10-CM | POA: Diagnosis not present

## 2020-07-22 DIAGNOSIS — D72829 Elevated white blood cell count, unspecified: Secondary | ICD-10-CM

## 2020-07-22 DIAGNOSIS — K2289 Other specified disease of esophagus: Secondary | ICD-10-CM | POA: Diagnosis not present

## 2020-07-22 DIAGNOSIS — Z992 Dependence on renal dialysis: Secondary | ICD-10-CM | POA: Diagnosis not present

## 2020-07-22 DIAGNOSIS — K264 Chronic or unspecified duodenal ulcer with hemorrhage: Secondary | ICD-10-CM | POA: Diagnosis not present

## 2020-07-22 DIAGNOSIS — R0600 Dyspnea, unspecified: Secondary | ICD-10-CM | POA: Diagnosis not present

## 2020-07-22 DIAGNOSIS — Z853 Personal history of malignant neoplasm of breast: Secondary | ICD-10-CM

## 2020-07-22 DIAGNOSIS — I700 Atherosclerosis of aorta: Secondary | ICD-10-CM | POA: Diagnosis present

## 2020-07-22 DIAGNOSIS — H409 Unspecified glaucoma: Secondary | ICD-10-CM | POA: Diagnosis present

## 2020-07-22 DIAGNOSIS — I890 Lymphedema, not elsewhere classified: Secondary | ICD-10-CM

## 2020-07-22 DIAGNOSIS — D259 Leiomyoma of uterus, unspecified: Secondary | ICD-10-CM | POA: Diagnosis not present

## 2020-07-22 DIAGNOSIS — E1151 Type 2 diabetes mellitus with diabetic peripheral angiopathy without gangrene: Secondary | ICD-10-CM | POA: Diagnosis present

## 2020-07-22 DIAGNOSIS — Z9011 Acquired absence of right breast and nipple: Secondary | ICD-10-CM

## 2020-07-22 DIAGNOSIS — R404 Transient alteration of awareness: Secondary | ICD-10-CM | POA: Diagnosis not present

## 2020-07-22 DIAGNOSIS — E785 Hyperlipidemia, unspecified: Secondary | ICD-10-CM | POA: Diagnosis present

## 2020-07-22 DIAGNOSIS — N186 End stage renal disease: Secondary | ICD-10-CM | POA: Diagnosis present

## 2020-07-22 DIAGNOSIS — E1142 Type 2 diabetes mellitus with diabetic polyneuropathy: Secondary | ICD-10-CM | POA: Diagnosis present

## 2020-07-22 DIAGNOSIS — F01518 Vascular dementia, unspecified severity, with other behavioral disturbance: Secondary | ICD-10-CM | POA: Diagnosis present

## 2020-07-22 DIAGNOSIS — D62 Acute posthemorrhagic anemia: Secondary | ICD-10-CM | POA: Diagnosis present

## 2020-07-22 DIAGNOSIS — I959 Hypotension, unspecified: Secondary | ICD-10-CM | POA: Diagnosis not present

## 2020-07-22 DIAGNOSIS — R1310 Dysphagia, unspecified: Secondary | ICD-10-CM | POA: Diagnosis present

## 2020-07-22 DIAGNOSIS — F0151 Vascular dementia with behavioral disturbance: Secondary | ICD-10-CM | POA: Diagnosis present

## 2020-07-22 DIAGNOSIS — F339 Major depressive disorder, recurrent, unspecified: Secondary | ICD-10-CM | POA: Diagnosis present

## 2020-07-22 DIAGNOSIS — I120 Hypertensive chronic kidney disease with stage 5 chronic kidney disease or end stage renal disease: Secondary | ICD-10-CM | POA: Diagnosis present

## 2020-07-22 DIAGNOSIS — Z794 Long term (current) use of insulin: Secondary | ICD-10-CM

## 2020-07-22 DIAGNOSIS — C9000 Multiple myeloma not having achieved remission: Secondary | ICD-10-CM | POA: Diagnosis present

## 2020-07-22 DIAGNOSIS — Z7982 Long term (current) use of aspirin: Secondary | ICD-10-CM

## 2020-07-22 DIAGNOSIS — Z880 Allergy status to penicillin: Secondary | ICD-10-CM

## 2020-07-22 DIAGNOSIS — E1169 Type 2 diabetes mellitus with other specified complication: Secondary | ICD-10-CM | POA: Diagnosis present

## 2020-07-22 DIAGNOSIS — E8889 Other specified metabolic disorders: Secondary | ICD-10-CM | POA: Diagnosis present

## 2020-07-22 DIAGNOSIS — D509 Iron deficiency anemia, unspecified: Secondary | ICD-10-CM | POA: Diagnosis present

## 2020-07-22 DIAGNOSIS — N250 Renal osteodystrophy: Secondary | ICD-10-CM | POA: Diagnosis not present

## 2020-07-22 DIAGNOSIS — D631 Anemia in chronic kidney disease: Secondary | ICD-10-CM | POA: Diagnosis not present

## 2020-07-22 DIAGNOSIS — S72143A Displaced intertrochanteric fracture of unspecified femur, initial encounter for closed fracture: Secondary | ICD-10-CM

## 2020-07-22 DIAGNOSIS — S72001A Fracture of unspecified part of neck of right femur, initial encounter for closed fracture: Secondary | ICD-10-CM | POA: Diagnosis present

## 2020-07-22 DIAGNOSIS — E1122 Type 2 diabetes mellitus with diabetic chronic kidney disease: Secondary | ICD-10-CM | POA: Diagnosis not present

## 2020-07-22 DIAGNOSIS — K259 Gastric ulcer, unspecified as acute or chronic, without hemorrhage or perforation: Secondary | ICD-10-CM | POA: Diagnosis not present

## 2020-07-22 DIAGNOSIS — Z8249 Family history of ischemic heart disease and other diseases of the circulatory system: Secondary | ICD-10-CM

## 2020-07-22 DIAGNOSIS — K625 Hemorrhage of anus and rectum: Secondary | ICD-10-CM | POA: Diagnosis present

## 2020-07-22 DIAGNOSIS — F0390 Unspecified dementia without behavioral disturbance: Secondary | ICD-10-CM | POA: Diagnosis not present

## 2020-07-22 DIAGNOSIS — K219 Gastro-esophageal reflux disease without esophagitis: Secondary | ICD-10-CM | POA: Diagnosis present

## 2020-07-22 DIAGNOSIS — Z833 Family history of diabetes mellitus: Secondary | ICD-10-CM

## 2020-07-22 DIAGNOSIS — E1129 Type 2 diabetes mellitus with other diabetic kidney complication: Secondary | ICD-10-CM | POA: Diagnosis not present

## 2020-07-22 DIAGNOSIS — S72141D Displaced intertrochanteric fracture of right femur, subsequent encounter for closed fracture with routine healing: Secondary | ICD-10-CM | POA: Diagnosis not present

## 2020-07-22 DIAGNOSIS — Z79811 Long term (current) use of aromatase inhibitors: Secondary | ICD-10-CM

## 2020-07-22 DIAGNOSIS — Z79899 Other long term (current) drug therapy: Secondary | ICD-10-CM

## 2020-07-22 DIAGNOSIS — I70209 Unspecified atherosclerosis of native arteries of extremities, unspecified extremity: Secondary | ICD-10-CM | POA: Diagnosis present

## 2020-07-22 DIAGNOSIS — K254 Chronic or unspecified gastric ulcer with hemorrhage: Principal | ICD-10-CM | POA: Diagnosis present

## 2020-07-22 DIAGNOSIS — K922 Gastrointestinal hemorrhage, unspecified: Secondary | ICD-10-CM | POA: Diagnosis not present

## 2020-07-22 DIAGNOSIS — K449 Diaphragmatic hernia without obstruction or gangrene: Secondary | ICD-10-CM | POA: Diagnosis present

## 2020-07-22 DIAGNOSIS — Z807 Family history of other malignant neoplasms of lymphoid, hematopoietic and related tissues: Secondary | ICD-10-CM

## 2020-07-22 DIAGNOSIS — K920 Hematemesis: Secondary | ICD-10-CM | POA: Diagnosis not present

## 2020-07-22 LAB — CBC WITH DIFFERENTIAL/PLATELET
Abs Immature Granulocytes: 0.17 10*3/uL — ABNORMAL HIGH (ref 0.00–0.07)
Basophils Absolute: 0 10*3/uL (ref 0.0–0.1)
Basophils Relative: 0 %
Eosinophils Absolute: 0.1 10*3/uL (ref 0.0–0.5)
Eosinophils Relative: 1 %
HCT: 23.5 % — ABNORMAL LOW (ref 36.0–46.0)
Hemoglobin: 7.1 g/dL — ABNORMAL LOW (ref 12.0–15.0)
Immature Granulocytes: 2 %
Lymphocytes Relative: 12 %
Lymphs Abs: 1.3 10*3/uL (ref 0.7–4.0)
MCH: 31.4 pg (ref 26.0–34.0)
MCHC: 30.2 g/dL (ref 30.0–36.0)
MCV: 104 fL — ABNORMAL HIGH (ref 80.0–100.0)
Monocytes Absolute: 0.8 10*3/uL (ref 0.1–1.0)
Monocytes Relative: 7 %
Neutro Abs: 9.2 10*3/uL — ABNORMAL HIGH (ref 1.7–7.7)
Neutrophils Relative %: 78 %
Platelets: 444 10*3/uL — ABNORMAL HIGH (ref 150–400)
RBC: 2.26 MIL/uL — ABNORMAL LOW (ref 3.87–5.11)
RDW: 17.2 % — ABNORMAL HIGH (ref 11.5–15.5)
WBC: 11.6 10*3/uL — ABNORMAL HIGH (ref 4.0–10.5)
nRBC: 0.2 % (ref 0.0–0.2)

## 2020-07-22 LAB — GLUCOSE, CAPILLARY
Glucose-Capillary: 175 mg/dL — ABNORMAL HIGH (ref 70–99)
Glucose-Capillary: 148 mg/dL — ABNORMAL HIGH (ref 70–99)
Glucose-Capillary: 137 mg/dL — ABNORMAL HIGH (ref 70–99)

## 2020-07-22 LAB — BASIC METABOLIC PANEL
Anion gap: 17 — ABNORMAL HIGH (ref 5–15)
BUN: 97 mg/dL — ABNORMAL HIGH (ref 8–23)
CO2: 27 mmol/L (ref 22–32)
Calcium: 8 mg/dL — ABNORMAL LOW (ref 8.9–10.3)
Chloride: 96 mmol/L — ABNORMAL LOW (ref 98–111)
Creatinine, Ser: 9.18 mg/dL — ABNORMAL HIGH (ref 0.44–1.00)
GFR, Estimated: 4 mL/min — ABNORMAL LOW (ref >60)
Glucose, Bld: 214 mg/dL — ABNORMAL HIGH (ref 70–99)
Potassium: 4.5 mmol/L (ref 3.5–5.1)
Sodium: 140 mmol/L (ref 135–145)

## 2020-07-22 LAB — PREPARE RBC (CROSSMATCH)

## 2020-07-22 LAB — RESP PANEL BY RT-PCR (FLU A&B, COVID) ARPGX2
Influenza A by PCR: NEGATIVE
Influenza B by PCR: NEGATIVE
SARS Coronavirus 2 by RT PCR: NEGATIVE

## 2020-07-22 LAB — POC OCCULT BLOOD, ED: Fecal Occult Bld: POSITIVE — AB

## 2020-07-22 LAB — MRSA PCR SCREENING: MRSA by PCR: NEGATIVE

## 2020-07-22 MED ORDER — ACYCLOVIR 200 MG PO CAPS
200.0000 mg | ORAL_CAPSULE | Freq: Every day | ORAL | Status: DC
  Administered 2020-07-23 – 2020-07-26 (×4): 200 mg via ORAL
  Filled 2020-07-22 (×6): qty 1

## 2020-07-22 MED ORDER — INSULIN ASPART 100 UNIT/ML IJ SOLN
8.0000 [IU] | Freq: Three times a day (TID) | INTRAMUSCULAR | Status: DC
  Administered 2020-07-23 – 2020-07-24 (×4): 8 [IU] via SUBCUTANEOUS

## 2020-07-22 MED ORDER — HEPARIN SODIUM (PORCINE) 1000 UNIT/ML DIALYSIS: 1000.0000 [IU] | INTRAMUSCULAR | Status: DC | PRN

## 2020-07-22 MED ORDER — CHLORHEXIDINE GLUCONATE CLOTH 2 % EX PADS
6.0000 | MEDICATED_PAD | Freq: Every day | CUTANEOUS | Status: DC
  Administered 2020-07-22 – 2020-07-25 (×4): 6 via TOPICAL

## 2020-07-22 MED ORDER — ONDANSETRON HCL 4 MG PO TABS: 4.0000 mg | ORAL_TABLET | Freq: Four times a day (QID) | ORAL | Status: DC | PRN

## 2020-07-22 MED ORDER — HEPARIN SODIUM (PORCINE) 1000 UNIT/ML DIALYSIS
3800.0000 [IU] | INTRAMUSCULAR | Status: DC | PRN
  Administered 2020-07-24 – 2020-07-26 (×2): 3800 [IU] via INTRAVENOUS_CENTRAL
  Filled 2020-07-22 (×2): qty 4

## 2020-07-22 MED ORDER — ATORVASTATIN CALCIUM 10 MG PO TABS
10.0000 mg | ORAL_TABLET | Freq: Every day | ORAL | Status: DC
  Administered 2020-07-23 – 2020-07-25 (×3): 10 mg via ORAL
  Filled 2020-07-22 (×4): qty 1

## 2020-07-22 MED ORDER — DOXERCALCIFEROL 4 MCG/2ML IV SOLN
2.0000 ug | INTRAVENOUS | Status: DC
  Administered 2020-07-22 – 2020-07-26 (×2): 2 ug via INTRAVENOUS
  Filled 2020-07-22 (×5): qty 2

## 2020-07-22 MED ORDER — ANASTROZOLE 1 MG PO TABS
1.0000 mg | ORAL_TABLET | Freq: Every day | ORAL | Status: DC
  Administered 2020-07-23 – 2020-07-26 (×4): 1 mg via ORAL
  Filled 2020-07-22 (×7): qty 1

## 2020-07-22 MED ORDER — SODIUM CHLORIDE 0.9 % IV BOLUS: 1000.0000 mL | Freq: Once | INTRAVENOUS | Status: DC

## 2020-07-22 MED ORDER — SODIUM CHLORIDE 0.9 % IV SOLN
80.0000 mg | Freq: Once | INTRAVENOUS | Status: AC
  Administered 2020-07-22: 80 mg via INTRAVENOUS
  Filled 2020-07-22: qty 80

## 2020-07-22 MED ORDER — SODIUM CHLORIDE 0.9 % IV SOLN
8.0000 mg/h | INTRAVENOUS | Status: AC
  Administered 2020-07-22 – 2020-07-25 (×6): 8 mg/h via INTRAVENOUS
  Filled 2020-07-22 (×8): qty 80

## 2020-07-22 MED ORDER — CLONIDINE HCL 0.2 MG PO TABS: 0.3000 mg | ORAL_TABLET | Freq: Two times a day (BID) | ORAL | Status: DC

## 2020-07-22 MED ORDER — MELATONIN 3 MG PO TABS
6.0000 mg | ORAL_TABLET | Freq: Every day | ORAL | Status: DC
  Administered 2020-07-23 – 2020-07-25 (×3): 6 mg via ORAL
  Filled 2020-07-22 (×4): qty 2

## 2020-07-22 MED ORDER — SERTRALINE HCL 50 MG PO TABS
50.0000 mg | ORAL_TABLET | Freq: Every day | ORAL | Status: DC
  Administered 2020-07-23 – 2020-07-26 (×4): 50 mg via ORAL
  Filled 2020-07-22 (×4): qty 1

## 2020-07-22 MED ORDER — CALCIUM ACETATE (PHOS BINDER) 667 MG PO CAPS: 1334.0000 mg | ORAL_CAPSULE | Freq: Two times a day (BID) | ORAL | Status: DC

## 2020-07-22 MED ORDER — CLONIDINE HCL 0.2 MG PO TABS
0.2000 mg | ORAL_TABLET | Freq: Two times a day (BID) | ORAL | Status: DC
  Administered 2020-07-23 – 2020-07-24 (×4): 0.2 mg via ORAL
  Filled 2020-07-22 (×4): qty 1

## 2020-07-22 MED ORDER — INSULIN GLARGINE 100 UNIT/ML ~~LOC~~ SOLN
10.0000 [IU] | Freq: Every day | SUBCUTANEOUS | Status: DC
  Administered 2020-07-23 – 2020-07-25 (×3): 10 [IU] via SUBCUTANEOUS
  Filled 2020-07-22 (×7): qty 0.1

## 2020-07-22 MED ORDER — SODIUM CHLORIDE 0.9 % IV SOLN: 100.0000 mL | INTRAVENOUS | Status: DC | PRN

## 2020-07-22 MED ORDER — AMLODIPINE BESYLATE 5 MG PO TABS
5.0000 mg | ORAL_TABLET | Freq: Every day | ORAL | Status: DC
  Administered 2020-07-23: 5 mg via ORAL
  Filled 2020-07-22: qty 1

## 2020-07-22 MED ORDER — ONDANSETRON HCL 4 MG/2ML IJ SOLN
4.0000 mg | Freq: Four times a day (QID) | INTRAMUSCULAR | Status: DC | PRN
  Administered 2020-07-23: 4 mg via INTRAVENOUS
  Filled 2020-07-22: qty 2

## 2020-07-22 MED ORDER — DARBEPOETIN ALFA 150 MCG/0.3ML IJ SOSY
150.0000 ug | PREFILLED_SYRINGE | INTRAMUSCULAR | Status: DC
  Administered 2020-07-22: 150 ug via INTRAVENOUS
  Filled 2020-07-22 (×2): qty 0.3

## 2020-07-22 MED ORDER — SODIUM CHLORIDE 0.9 % IV SOLN
10.0000 mL/h | Freq: Once | INTRAVENOUS | Status: AC
  Administered 2020-07-22: 10 mL/h via INTRAVENOUS

## 2020-07-22 MED ORDER — ACETAMINOPHEN 650 MG RE SUPP: 650.0000 mg | Freq: Four times a day (QID) | RECTAL | Status: DC | PRN

## 2020-07-22 MED ORDER — INSULIN ASPART 100 UNIT/ML IJ SOLN
0.0000 [IU] | Freq: Three times a day (TID) | INTRAMUSCULAR | Status: DC
  Administered 2020-07-22 – 2020-07-25 (×4): 1 [IU] via SUBCUTANEOUS

## 2020-07-22 MED ORDER — ALTEPLASE 2 MG IJ SOLR
2.0000 mg | Freq: Once | INTRAMUSCULAR | Status: DC | PRN
  Filled 2020-07-22: qty 2

## 2020-07-22 MED ORDER — CALCIUM CARBONATE-VITAMIN D 500-200 MG-UNIT PO TABS
1.0000 | ORAL_TABLET | Freq: Every day | ORAL | Status: DC
  Administered 2020-07-23 – 2020-07-26 (×4): 1 via ORAL
  Filled 2020-07-22 (×4): qty 1

## 2020-07-22 MED ORDER — AMLODIPINE BESYLATE 5 MG PO TABS: 10.0000 mg | ORAL_TABLET | Freq: Every day | ORAL | Status: DC

## 2020-07-22 MED ORDER — ACETAMINOPHEN 325 MG PO TABS: 650.0000 mg | ORAL_TABLET | Freq: Four times a day (QID) | ORAL | Status: DC | PRN

## 2020-07-22 MED ORDER — ADULT MULTIVITAMIN W/MINERALS CH: 1.0000 | ORAL_TABLET | Freq: Every day | ORAL | Status: DC

## 2020-07-22 NOTE — Progress Notes (Signed)
Consent obtained via telephone by 2 RN's by patient's sister Carrye Goller. Placed in front of chart.

## 2020-07-22 NOTE — ED Triage Notes (Signed)
Arrived via [facility] from [facility] for c/o emesis with blood as well as bloody stool that started this morning per nursing staff at facility.

## 2020-07-22 NOTE — Consult Note (Signed)
Referring Provider: Dr. Almyra Free Primary Care Physician:  Hendricks Limes, MD Primary Gastroenterologist:  Dr. Gala Romney  Date of Admission: 07/22/20 Date of Consultation: 07/22/20  Reason for Consultation:  GI bleed  HPI:  Carly Wood is an 85 y.o. year old female resident of [facility], history of multiple myeloma and followed by Dr. Delton Coombes, ESRD on dialysis M/W/F, dementia, presenting via EMS from the [facility] due to hematemesis and bloody stool per nursing staff at facility. Difficult historian due to dementia. She notes her sister, Carly Wood, provides consent for treatments. Upon arrival to ED, she was noted to have moderate to large amounts of gross melena in depends. Presenting  Hgb 7.1, previously 9.4 about 5 days ago. Discharge 07/17/20 from admission for right hip fracture, undergoing repair on 07/12/20. Received 1 unit PRBCs while inpatient with improvement in Hgb from 7 range to 9 range. Discharged on 81 mg aspirin BID for DVT prophylaxis. Heme positive stool today. Upon consultation, she had dark brown/black emesis on gown and had recently coughed followed by vomiting of moderate amount of bloody emesis with large clots per nursing note. She also had dark red stool output per nursing note. 1 unit PRBCs has been ordered but not given yet.   At time of consultation, mildly tachycardic in low 100s, BP 102/51. Patient is not a valid historian. However, she denies any abdominal pain to me. Unable to provide any history. Notably, she does have a history of PUD in June 2018, query due to NSAIDs, and surveillance EGD in Sept 2018 with previously noted gastric ulcers had healed.   I spoke with Carly Wood, patient's sister at [phone]. Carly Wood states she is her POA and assists in medical decision making and consents.    Past Medical History:  Diagnosis Date  . Anemia     chronic macrocytic anemia  . Anxiety   . Chronic kidney disease   . Chronic renal disease, stage 4, severely  decreased glomerular filtration rate (GFR) between 15-29 mL/min/1.73 square meter (HCC) 08/22/2015  . Complication of anesthesia    delirious after Breast Surgery  . Dementia ([place])    mild  . Depression   . Diabetes mellitus with ESRD (end-stage renal disease) ([place])    type II  . Dyspnea    with activity  . GERD (gastroesophageal reflux disease)   . Glaucoma   . Hypertension   . Pneumonia   . Stage 1 infiltrating ductal carcinoma of right female breast ([place]) 08/21/2015   ER+ PR+ HER 2 neu + (3+) T1cN0     Past Surgical History:  Procedure Laterality Date  . A/V FISTULAGRAM Right 07/05/2020   Procedure: A/V Fistulagram;  Surgeon: Elam Dutch, MD;  Location: [place] [facility];  Service: Cardiovascular;  Laterality: Right;  . AV FISTULA PLACEMENT Left 11/22/2017   Procedure: ARTERIOVENOUS (AV) FISTULA CREATION LEFT ARM;  Surgeon: Elam Dutch, MD;  Location: [facility] OR;  Service: Vascular;  Laterality: Left;  . AV FISTULA PLACEMENT Right 04/04/2020   Procedure: RIGHT ARM ARTERIOVENOUS FISTULA CREATION;  Surgeon: Rosetta Posner, MD;  Location: [facility];  Service: Vascular;  Laterality: Right;  . BIOPSY  08/07/2016   Procedure: BIOPSY;  Surgeon: Daneil Dolin, MD;  Location: [facility];  Service: Endoscopy;;  gastric ulcer biopsy  . COLONOSCOPY    . ESOPHAGOGASTRODUODENOSCOPY N/A 08/07/2016   LA Grade A esophagitis s/p dilation, small hiatal hernia, multiple gastric ulcers and erosions, duodenal erosions s/p biopsy. Negative H.pylori   .  ESOPHAGOGASTRODUODENOSCOPY N/A 11/27/2016   normal esophagus, previously noted gastric ulcers completely healed, normal duodenum.   Marland Kitchen FISTULA SUPERFICIALIZATION Left 02/14/2018   Procedure: FISTULA SUPERFICIALIZATION LEFT ARM;  Surgeon: Angelia Mould, MD;  Location: [place];  Service: Vascular;  Laterality: Left;  . FRACTURE SURGERY Right    ankle  . INTRAMEDULLARY (IM) NAIL INTERTROCHANTERIC Right 07/12/2020   Procedure: INTRAMEDULLARY (IM) NAIL  INTERTROCHANTRIC;  Surgeon: Mordecai Rasmussen, MD;  Location: [facility];  Service: Orthopedics;  Laterality: Right;  . MALONEY DILATION N/A 08/07/2016   Procedure: Venia Minks DILATION;  Surgeon: Daneil Dolin, MD;  [facility];  Service: Endoscopy;  Laterality: N/A;  . MASTECTOMY, PARTIAL Right   . multiple myeloma    . PERIPHERAL VASCULAR BALLOON ANGIOPLASTY Left 07/13/2019   Procedure: PERIPHERAL VASCULAR BALLOON ANGIOPLASTY;  Surgeon: Marty Heck, MD;  Location: [place] [facility];  Service: Cardiovascular;  Laterality: Left;  arm fistulogram  . PERIPHERAL VASCULAR BALLOON ANGIOPLASTY Right 05/22/2020   Procedure: PERIPHERAL VASCULAR BALLOON ANGIOPLASTY;  Surgeon: Cherre Robins, MD;  Location: [place] [facility];  Service: Cardiovascular;  Laterality: Right;  arm fistula  . PERIPHERAL VASCULAR BALLOON ANGIOPLASTY Right 07/05/2020   Procedure: PERIPHERAL VASCULAR BALLOON ANGIOPLASTY;  Surgeon: Elam Dutch, MD;  Location: [place] [facility];  Service: Cardiovascular;  Laterality: Right;  arm fistula  . PORT-A-CATH REMOVAL Left 11/22/2017   Procedure: REMOVAL PORT-A-CATH LEFT CHEST;  Surgeon: Elam Dutch, MD;  Location: [facility] OR;  Service: Vascular;  Laterality: Left;  . RETINAL DETACHMENT SURGERY Right     Prior to Admission medications   Medication Sig Start Date End Date Taking? Authorizing Provider  acetaminophen (TYLENOL) 650 MG CR tablet Take 650 mg by mouth every 6 (six) hours. 01/09/20  Yes [provider]  acyclovir (ZOVIRAX) 200 MG capsule Take 200 mg by mouth daily. 10/20/19  Yes [provider]  amLODipine (NORVASC) 10 MG tablet Take 10 mg by mouth daily. 01/25/18  Yes [provider]  anastrozole (ARIMIDEX) 1 MG tablet TAKE 1 TABLET BY MOUTH DAILY Patient taking differently: Take 1 mg by mouth daily. 10/30/19  Yes Derek Jack, MD  aspirin EC 81 MG EC tablet Take 1 tablet (81 mg total) by mouth 2 (two) times daily. Swallow whole.  07/17/20  Yes Emokpae, Courage, MD  atorvastatin (LIPITOR) 10 MG tablet Take 10 mg by mouth at bedtime.   Yes [provider]  Calcium Acetate 667 MG TABS Take 667 mg by mouth daily. Take 1 tablet w/ snacks daily   Yes [provider]  Calcium Acetate 667 MG TABS Take 1,334 mg by mouth in the morning and at bedtime. 2 tabs with meals   Yes [provider]  calcium-vitamin D (OSCAL WITH D) 500-200 MG-UNIT tablet Take 1 tablet by mouth daily. At 8 am   Yes [provider]  cloNIDine (CATAPRES) 0.3 MG tablet Take 0.3 mg by mouth 2 (two) times daily.    Yes [provider]  insulin glargine (LANTUS SOLOSTAR) 100 UNIT/ML Solostar Pen Inject 10 Units into the skin at bedtime. 01/23/20  Yes [provider]  insulin lispro (HUMALOG) 100 UNIT/ML KwikPen Inject 100 Units into the skin in the morning and at bedtime. 10 units, subcutaneous, Twice A Day, Hold for accu-check less than 150. Do not give more than 10 minutes before eating   Yes [provider]  insulin lispro (HUMALOG) 100 UNIT/ML KwikPen Inject 15 Units into the skin daily.  Hold for accu-check less than 150. Do not give more than 10 minutes before eating   Yes [provider]  melatonin 3 MG TABS tablet Take 6 mg by mouth at bedtime. For Sleep 10/17/19  Yes [provider]  Multiple Vitamin (MULTIVITAMIN) capsule Take 1 capsule by mouth daily.   Yes [provider]  sertraline (ZOLOFT) 50 MG tablet Take 50 mg by mouth daily.   Yes [provider]  NON FORMULARY Diet- renal carb modified 1200cc fluid restriction    [provider]    Current Facility-Administered Medications  Medication Dose Route Frequency Provider Last Rate Last Admin  . 0.9 %  sodium chloride infusion  10 mL/hr Intravenous Once Hong, Greggory Brandy, MD      . acetaminophen (TYLENOL) tablet 650 mg  650 mg Oral Q6H PRN Johnson, Clanford L, MD       Or  . acetaminophen (TYLENOL)  suppository 650 mg  650 mg Rectal Q6H PRN Johnson, Clanford L, MD      . acyclovir (ZOVIRAX) 200 MG capsule 200 mg  200 mg Oral Daily Johnson, Clanford L, MD      . Derrill Memo ON 07/23/2020] amLODipine (NORVASC) tablet 5 mg  5 mg Oral Daily Corliss Parish, MD      . anastrozole (ARIMIDEX) tablet 1 mg  1 mg Oral Daily Johnson, Clanford L, MD      . atorvastatin (LIPITOR) tablet 10 mg  10 mg Oral QHS Johnson, Clanford L, MD      . calcium acetate (PHOSLO) capsule 1,334 mg  1,334 mg Oral BID WC Johnson, Clanford L, MD      . calcium-vitamin D (OSCAL WITH D) 500-200 MG-UNIT per tablet 1 tablet  1 tablet Oral Daily Johnson, Clanford L, MD      . Chlorhexidine Gluconate Cloth 2 % PADS 6 each  6 each Topical Q0600 Corliss Parish, MD      . cloNIDine (CATAPRES) tablet 0.2 mg  0.2 mg Oral BID Corliss Parish, MD      . insulin aspart (novoLOG) injection 0-6 Units  0-6 Units Subcutaneous TID WC Johnson, Clanford L, MD      . insulin aspart (novoLOG) injection 8 Units  8 Units Subcutaneous TID WC Johnson, Clanford L, MD      . insulin glargine (LANTUS) injection 10 Units  10 Units Subcutaneous QHS Johnson, Clanford L, MD      . melatonin tablet 6 mg  6 mg Oral QHS Johnson, Clanford L, MD      . multivitamin with minerals tablet 1 tablet  1 tablet Oral Daily Johnson, Clanford L, MD      . ondansetron (ZOFRAN) tablet 4 mg  4 mg Oral Q6H PRN Johnson, Clanford L, MD       Or  . ondansetron (ZOFRAN) injection 4 mg  4 mg Intravenous Q6H PRN Johnson, Clanford L, MD      . pantoprazole (PROTONIX) 80 mg in sodium chloride 0.9 % 100 mL (0.8 mg/mL) infusion  8 mg/hr Intravenous Continuous Johnson, Clanford L, MD      . pantoprazole (PROTONIX) 80 mg in sodium chloride 0.9 % 100 mL IVPB  80 mg Intravenous Once Johnson, Clanford L, MD      . sertraline (ZOLOFT) tablet 50 mg  50 mg Oral Daily Johnson, Clanford L, MD       Current Outpatient Medications  Medication Sig Dispense Refill  . acetaminophen  (TYLENOL) 650 MG CR tablet Take 650 mg by mouth every  6 (six) hours.    Marland Kitchen acyclovir (ZOVIRAX) 200 MG capsule Take 200 mg by mouth daily.    Marland Kitchen amLODipine (NORVASC) 10 MG tablet Take 10 mg by mouth daily.  3  . anastrozole (ARIMIDEX) 1 MG tablet TAKE 1 TABLET BY MOUTH DAILY (Patient taking differently: Take 1 mg by mouth daily.) 30 tablet 6  . aspirin EC 81 MG EC tablet Take 1 tablet (81 mg total) by mouth 2 (two) times daily. Swallow whole. 30 tablet 1  . atorvastatin (LIPITOR) 10 MG tablet Take 10 mg by mouth at bedtime.    . Calcium Acetate 667 MG TABS Take 667 mg by mouth daily. Take 1 tablet w/ snacks daily    . Calcium Acetate 667 MG TABS Take 1,334 mg by mouth in the morning and at bedtime. 2 tabs with meals    . calcium-vitamin D (OSCAL WITH D) 500-200 MG-UNIT tablet Take 1 tablet by mouth daily. At 8 am    . cloNIDine (CATAPRES) 0.3 MG tablet Take 0.3 mg by mouth 2 (two) times daily.     . insulin glargine (LANTUS SOLOSTAR) 100 UNIT/ML Solostar Pen Inject 10 Units into the skin at bedtime.    . insulin lispro (HUMALOG) 100 UNIT/ML KwikPen Inject 100 Units into the skin in the morning and at bedtime. 10 units, subcutaneous, Twice A Day, Hold for accu-check less than 150. Do not give more than 10 minutes before eating    . insulin lispro (HUMALOG) 100 UNIT/ML KwikPen Inject 15 Units into the skin daily. Hold for accu-check less than 150. Do not give more than 10 minutes before eating    . melatonin 3 MG TABS tablet Take 6 mg by mouth at bedtime. For Sleep    . Multiple Vitamin (MULTIVITAMIN) capsule Take 1 capsule by mouth daily.    . sertraline (ZOLOFT) 50 MG tablet Take 50 mg by mouth daily.    . NON FORMULARY Diet- renal carb modified 1200cc fluid restriction      Allergies as of 07/22/2020 - Review Complete 07/22/2020  Allergen Reaction Noted  . Ace inhibitors Other (See Comments) and Cough 08/19/2015  . Angiotensin receptor blockers  07/19/2020  . Penicillins Other (See Comments)  08/19/2015    Family History  Problem Relation Age of Onset  . Multiple myeloma Sister   . Brain cancer Sister   . Dementia Mother        died at 16  . Stroke Mother   . Heart failure Mother   . Diabetes Mother   . Heart disease Father   . Prostate cancer Brother   . Colon cancer Neg Hx     Social History   Socioeconomic History  . Marital status: Single    Spouse name: Not on file  . Number of children: Not on file  . Years of education: Not on file  . Highest education level: Not on file  Occupational History  . Occupation: retired   Tobacco Use  . Smoking status: Never Smoker  . Smokeless tobacco: Never Used  Vaping Use  . Vaping Use: Never used  Substance and Sexual Activity  . Alcohol use: No    Alcohol/week: 0.0 standard drinks  . Drug use: No  . Sexual activity: Never  Other Topics Concern  . Not on file  Social History Narrative   Long term resident of SNF    Social Determinants of Health   Financial Resource Strain: Low Risk   . Difficulty of Paying Living Expenses:  Not very hard  Food Insecurity: No Food Insecurity  . Worried About Charity fundraiser in the Last Year: Never true  . Ran Out of Food in the Last Year: Never true  Transportation Needs: No Transportation Needs  . Lack of Transportation (Medical): No  . Lack of Transportation (Non-Medical): No  Physical Activity: Inactive  . Days of Exercise per Week: 0 days  . Minutes of Exercise per Session: 0 min  Stress: No Stress Concern Present  . Feeling of Stress : Not at all  Social Connections: Moderately Isolated  . Frequency of Communication with Friends and Family: More than three times a week  . Frequency of Social Gatherings with Friends and Family: Once a week  . Attends Religious Services: More than 4 times per year  . Active Member of Clubs or Organizations: No  . Attends Archivist Meetings: Never  . Marital Status: Never married  Intimate Partner Violence: Not At Risk   . Fear of Current or Ex-Partner: No  . Emotionally Abused: No  . Physically Abused: No  . Sexually Abused: No    Review of Systems: Unable to obtain due to cognitive status  Physical Exam: Vital signs in last 24 hours: Temp:  [98.5 F (36.9 C)] 98.5 F (36.9 C) (05/16 0757) Pulse Rate:  [90-95] 90 (05/16 0912) Resp:  [21-26] 21 (05/16 0912) BP: (102-115)/(51-52) 102/51 (05/16 0912) SpO2:  [96 %-99 %] 99 % (05/16 0912) Weight:  [68 kg] 68 kg (05/16 0801)   General:   Alert, acutely ill, no distress noted however, able to state the year and state, confusion regarding residency and what brought her to the ED Head:  Normocephalic and atraumatic. Eyes:  Sclera clear, no icterus.   Conjunctiva pink. Lungs:  Clear throughout to auscultation.    Heart:  S1 S2 present with mild tachycardia, systolic murmur noted Abdomen:  Soft, nontender and nondistended. No masses, hepatosplenomegaly or hernias noted. Normal bowel sounds, without guarding, and without rebound.   Rectal:  Deferred  Msk:  Symmetrical without gross deformities. Normal posture. Pulses:  Normal pulses noted. Extremities:  Without  edema. Neurologic:  Alert and  Oriented to person and place only, unknown situation Psych:  Alert and cooperative. Normal mood and affect.  Intake/Output from previous day: No intake/output data recorded. Intake/Output this shift: No intake/output data recorded.  Lab Results: Recent Labs    07/22/20 0841  WBC 11.6*  HGB 7.1*  HCT 23.5*  PLT 444*   BMET Recent Labs    07/22/20 0841  NA 140  K 4.5  CL 96*  CO2 27  GLUCOSE 214*  BUN 97*  CREATININE 9.18*  CALCIUM 8.0*    Impression: Pleasant 85 y.o. year old female resident of [place], history of multiple myeloma and followed by Dr. Delton Coombes, ESRD on dialysis M/W/F, dementia, presenting via EMS due to hematemesis and melena, found to have acute on chronic anemia with Hgb 7.1, previously 9.4 several days ago at  discharge from prior hospitalization for hip fracture. Notably, she was discharged on 81 mg aspirin BID; she has a history significant for PUD in June 2018, with surveillance EGD with documentation of healed ulcers. At that time, query NSAID-related.   She is a difficult historian due to dementia. Mildly tachycardic in the low 100s, with BP 102/51 at time of consultation. She has had active melena and bright red blood with clots, and last episode of hematemesis prior to this consultation. Clinical scenario consistent  moreso with rapid transit UGI bleed. 1 unit PRBCs has been ordered but does not appear has been started. I have made her NPO in preparation for EGD once stabilized. I spoke with her sister, Carly Wood, who is her POA, at [phone]. POA is agreeable to endoscopic procedure and aware of risks and benefits. I discussed with her we would update her on timing and obtain verbal consult over the phone prior to procedure.   Nephrology on board due to ESRD on dialysis. Dialysis has been recommended for today WITHOUT heparin.   Plan: Remain NPO for now Agree with PPI infusion Agree with blood transfusion Follow H/H Will need to obtain consent from sister, Carly Wood, for EGD. I have spoken with her personally regarding need for EGD and will update on timing when this is decided   Annitta Needs, PhD, ANP-BC [facility]     LOS: 0 days    07/22/2020, 11:41 AM

## 2020-07-22 NOTE — ED Provider Notes (Signed)
[facility] EMERGENCY DEPARTMENT Provider Note   CSN: [id number] Arrival date & time: 07/22/20  4580     History Chief Complaint  Patient presents with  . Rectal Bleeding    Carly Wood is a 85 y.o. female.  Patient presents chief complaint of GI bleed.  She presents from a nursing home after recent hip surgery about 2 weeks ago presenting with GI bleed today.  Patient complaining of generalized weakness but denies any headache or chest pain or abdominal pain.  Denies other illnesses such as fevers or cough.        Past Medical History:  Diagnosis Date  . Anemia     chronic macrocytic anemia  . Anxiety   . Chronic kidney disease   . Chronic renal disease, stage 4, severely decreased glomerular filtration rate (GFR) between 15-29 mL/min/1.73 square meter (HCC) 08/22/2015  . Complication of anesthesia    delirious after Breast Surgery  . Dementia ([place])    mild  . Depression   . Diabetes mellitus with ESRD (end-stage renal disease) ([place])    type II  . Dyspnea    with activity  . GERD (gastroesophageal reflux disease)   . Glaucoma   . Hypertension   . Pneumonia   . Stage 1 infiltrating ductal carcinoma of right female breast ([place]) 08/21/2015   ER+ PR+ HER 2 neu + (3+) T1cN0     Patient Active Problem List   Diagnosis Date Noted  . Acute upper GI bleed 07/22/2020  . Major depression, recurrent, chronic ([place]) 07/18/2020  . ESRD (end stage renal disease) ([place]) 07/13/2020  . Closed right hip fracture ([place]) 07/11/2020  . Hyperlipidemia associated with type 2 diabetes mellitus ([place]) 06/19/2020  . Aortic atherosclerosis ([place]) 04/01/2020  . Atherosclerotic peripheral vascular disease ([place]) 04/01/2020  . Acute pain of left knee 01/12/2020  . Hyperkalemia 12/25/2019  . Dependence on renal dialysis ([place]) 10/25/2019  . Chronic kidney disease with end stage renal disease on dialysis due to type 2 diabetes mellitus ([place]) 10/19/2019  . Hypertension due to end stage renal  disease caused by type 2 diabetes mellitus, on dialysis ([place]) 10/19/2019  . Anemia due to end stage renal disease ([place]) 10/19/2019  . Herpes 10/19/2019  . Vascular dementia with behavior disturbance ([place]) 10/19/2019  . Diabetes mellitus with ESRD (end-stage renal disease) ([place]) 10/12/2019  . Multiple myeloma ([place]) 10/04/2018  . Medicare annual wellness visit, subsequent 10/04/2018  . MGUS (monoclonal gammopathy of unknown significance) 09/20/2018  . Iron deficiency anemia 05/03/2017  . Multiple gastric ulcers 11/10/2016  . Dysphagia 07/31/2016  . GERD (gastroesophageal reflux disease) 07/31/2016  . Abnormal weight loss 07/31/2016  . Aromatase inhibitor use 08/22/2015  . Lymphedema of arm 08/22/2015  . Status post right mastectomy 08/22/2015  . Osteopenia determined by x-ray 08/22/2015  . Stage 1 infiltrating ductal carcinoma of right female breast ([place]) 08/21/2015    Past Surgical History:  Procedure Laterality Date  . A/V FISTULAGRAM Right 07/05/2020   Procedure: A/V Fistulagram;  Surgeon: Elam Dutch, MD;  Location: [place] [facility];  Service: Cardiovascular;  Laterality: Right;  . AV FISTULA PLACEMENT Left 11/22/2017   Procedure: ARTERIOVENOUS (AV) FISTULA CREATION LEFT ARM;  Surgeon: Elam Dutch, MD;  Location: [facility] OR;  Service: Vascular;  Laterality: Left;  . AV FISTULA PLACEMENT Right 04/04/2020   Procedure: RIGHT ARM ARTERIOVENOUS FISTULA CREATION;  Surgeon: Rosetta Posner, MD;  Location: [facility];  Service: Vascular;  Laterality: Right;  . BIOPSY  08/07/2016   Procedure: BIOPSY;  Surgeon: Daneil Dolin, MD;  Location[facility];  Service: Endoscopy;;  gastric ulcer biopsy  . COLONOSCOPY    . ESOPHAGOGASTRODUODENOSCOPY N/A 08/07/2016   Procedure: ESOPHAGOGASTRODUODENOSCOPY (EGD);  Surgeon: Daneil Dolin, MD;  [facility];  Service: Endoscopy;  Laterality: N/A;  1215-rescheduled to 6/1 @ 2:30pm per Tretha Sciara  . ESOPHAGOGASTRODUODENOSCOPY N/A 11/27/2016    Procedure: ESOPHAGOGASTRODUODENOSCOPY (EGD);  Surgeon: Daneil Dolin, MD;  [facility];  Service: Endoscopy;  Laterality: N/A;  8:15am  . FISTULA SUPERFICIALIZATION Left 02/14/2018   Procedure: FISTULA SUPERFICIALIZATION LEFT ARM;  Surgeon: Angelia Mould, MD;  Location: [place];  Service: Vascular;  Laterality: Left;  . FRACTURE SURGERY Right    ankle  . INTRAMEDULLARY (IM) NAIL INTERTROCHANTERIC Right 07/12/2020   Procedure: INTRAMEDULLARY (IM) NAIL INTERTROCHANTRIC;  Surgeon: Mordecai Rasmussen, MD;  Location: [facility];  Service: Orthopedics;  Laterality: Right;  . MALONEY DILATION N/A 08/07/2016   Procedure: Venia Minks DILATION;  Surgeon: Daneil Dolin, MD;  [facility];  Service: Endoscopy;  Laterality: N/A;  . MASTECTOMY, PARTIAL Right   . PERIPHERAL VASCULAR BALLOON ANGIOPLASTY Left 07/13/2019   Procedure: PERIPHERAL VASCULAR BALLOON ANGIOPLASTY;  Surgeon: Marty Heck, MD;  Location: [place] [facility];  Service: Cardiovascular;  Laterality: Left;  arm fistulogram  . PERIPHERAL VASCULAR BALLOON ANGIOPLASTY Right 05/22/2020   Procedure: PERIPHERAL VASCULAR BALLOON ANGIOPLASTY;  Surgeon: Cherre Robins, MD;  Location: [place] [facility];  Service: Cardiovascular;  Laterality: Right;  arm fistula  . PERIPHERAL VASCULAR BALLOON ANGIOPLASTY Right 07/05/2020   Procedure: PERIPHERAL VASCULAR BALLOON ANGIOPLASTY;  Surgeon: Elam Dutch, MD;  Location: [place] [facility];  Service: Cardiovascular;  Laterality: Right;  arm fistula  . PORT-A-CATH REMOVAL Left 11/22/2017   Procedure: REMOVAL PORT-A-CATH LEFT CHEST;  Surgeon: Elam Dutch, MD;  Location: [facility];  Service: Vascular;  Laterality: Left;  . RETINAL DETACHMENT SURGERY Right      OB History   No obstetric history on file.     Family History  Problem Relation Age of Onset  . Multiple myeloma Sister   . Brain cancer Sister   . Dementia Mother        died at 57  . Stroke Mother   . Heart failure  Mother   . Diabetes Mother   . Heart disease Father   . Prostate cancer Brother   . Colon cancer Neg Hx     Social History   Tobacco Use  . Smoking status: Never Smoker  . Smokeless tobacco: Never Used  Vaping Use  . Vaping Use: Never used  Substance Use Topics  . Alcohol use: No    Alcohol/week: 0.0 standard drinks  . Drug use: No    Home Medications Prior to Admission medications   Medication Sig Start Date End Date Taking? Authorizing Provider  acetaminophen (TYLENOL) 650 MG CR tablet Take 650 mg by mouth every 6 (six) hours. 01/09/20  Yes [provider]  acyclovir (ZOVIRAX) 200 MG capsule Take 200 mg by mouth daily. 10/20/19  Yes [provider]  amLODipine (NORVASC) 10 MG tablet Take 10 mg by mouth daily. 01/25/18  Yes [provider]  anastrozole (ARIMIDEX) 1 MG tablet TAKE 1 TABLET BY MOUTH DAILY Patient taking differently: Take 1 mg by mouth daily. 10/30/19  Yes Derek Jack, MD  aspirin EC 81 MG EC tablet Take 1 tablet (81 mg total) by mouth 2 (two)  times daily. Swallow whole. 07/17/20  Yes Emokpae, Courage, MD  atorvastatin (LIPITOR) 10 MG tablet Take 10 mg by mouth at bedtime.   Yes [provider]  Calcium Acetate 667 MG TABS Take 667 mg by mouth daily. Take 1 tablet w/ snacks daily   Yes [provider]  Calcium Acetate 667 MG TABS Take 1,334 mg by mouth in the morning and at bedtime. 2 tabs with meals   Yes [provider]  calcium-vitamin D (OSCAL WITH D) 500-200 MG-UNIT tablet Take 1 tablet by mouth daily. At 8 am   Yes [provider]  cloNIDine (CATAPRES) 0.3 MG tablet Take 0.3 mg by mouth 2 (two) times daily.    Yes [provider]  insulin glargine (LANTUS SOLOSTAR) 100 UNIT/ML Solostar Pen Inject 10 Units into the skin at bedtime. 01/23/20  Yes [provider]  insulin lispro (HUMALOG) 100 UNIT/ML KwikPen Inject 100 Units into the skin in the morning and at bedtime. 10 units,  subcutaneous, Twice A Day, Hold for accu-check less than 150. Do not give more than 10 minutes before eating   Yes [provider]  insulin lispro (HUMALOG) 100 UNIT/ML KwikPen Inject 15 Units into the skin daily. Hold for accu-check less than 150. Do not give more than 10 minutes before eating   Yes [provider]  melatonin 3 MG TABS tablet Take 6 mg by mouth at bedtime. For Sleep 10/17/19  Yes [provider]  Multiple Vitamin (MULTIVITAMIN) capsule Take 1 capsule by mouth daily.   Yes [provider]  sertraline (ZOLOFT) 50 MG tablet Take 50 mg by mouth daily.   Yes [provider]  NON FORMULARY Diet- renal carb modified 1200cc fluid restriction    [provider]    Allergies    Ace inhibitors, Angiotensin receptor blockers, and Penicillins  Review of Systems   Review of Systems  Constitutional: Negative for fever.  HENT: Negative for ear pain.   Eyes: Negative for pain.  Respiratory: Negative for cough.   Cardiovascular: Negative for chest pain.  Gastrointestinal: Negative for abdominal pain.  Genitourinary: Negative for flank pain.  Musculoskeletal: Negative for back pain.  Skin: Negative for rash.  Neurological: Negative for headaches.    Physical Exam Updated Vital Signs BP (!) 102/51   Pulse 90   Temp 98.5 F (36.9 C) (Oral)   Resp (!) 21   Ht _0  (1.6 m)   Wt 68 kg   SpO2 99%   BMI 26.57 kg/m   Physical Exam Constitutional:      General: She is not in acute distress.    Appearance: Normal appearance.  HENT:     Head: Normocephalic.     Nose: Nose normal.  Eyes:     Extraocular Movements: Extraocular movements intact.  Cardiovascular:     Rate and Rhythm: Normal rate.  Pulmonary:     [place]: Pulmonary effort is normal.  Abdominal:     General: Abdomen is flat. There is no distension.     Tenderness: There is no abdominal tenderness.  Genitourinary:    Rectum: Guaiac result positive.      Comments: Moderate to large amounts of gross melena noted on patient's depends. Musculoskeletal:        General: Normal range of motion.     Cervical back: Normal range of motion.  Neurological:     General: No focal deficit present.     Mental Status: She is alert. Mental status is at  baseline.     ED Results / Procedures / Treatments   Labs (all labs ordered are listed, but only abnormal results are displayed) Labs Reviewed  CBC WITH DIFFERENTIAL/PLATELET - Abnormal; Notable for the following components:      Result Value   WBC 11.6 (*)    RBC 2.26 (*)    Hemoglobin 7.1 (*)    HCT 23.5 (*)    MCV 104.0 (*)    RDW 17.2 (*)    Platelets 444 (*)    Neutro Abs 9.2 (*)    Abs Immature Granulocytes 0.17 (*)    All other components within normal limits  BASIC METABOLIC PANEL - Abnormal; Notable for the following components:   Chloride 96 (*)    Glucose, Bld 214 (*)    BUN 97 (*)    Creatinine, Ser 9.18 (*)    Calcium 8.0 (*)    GFR, Estimated 4 (*)    Anion gap 17 (*)    All other components within normal limits  POC OCCULT BLOOD, ED - Abnormal; Notable for the following components:   Fecal Occult Bld POSITIVE (*)    All other components within normal limits  RESP PANEL BY RT-PCR (FLU A&B, COVID) ARPGX2  TYPE AND SCREEN  PREPARE RBC (CROSSMATCH)    EKG None  Radiology No results found.  Procedures .Critical Care E&M Performed by: Luna Fuse, MD  Critical care provider statement:    Critical care time (minutes):  30   Critical care time was exclusive of:  Separately billable procedures and treating other patients   Critical care was necessary to treat or prevent imminent or life-threatening deterioration of the following conditions:  Circulatory failure After initial E/M assessment, critical care services were subsequently performed that were exclusive of separately billable procedures or treatment.   Comments:     Severe anemia with GI bleed       Medications Ordered in ED Medications  pantoprazole (PROTONIX) 80 mg in sodium chloride 0.9 % 100 mL IVPB (has no administration in time range)  pantoprazole (PROTONIX) 80 mg in sodium chloride 0.9 % 100 mL (0.8 mg/mL) infusion (has no administration in time range)  0.9 %  sodium chloride infusion (has no administration in time range)    ED Course  I have reviewed the triage vital signs and the nursing notes.  Pertinent labs & imaging results that were available during my care of the patient were reviewed by me and considered in my medical decision making (see chart for details).    MDM Rules/Calculators/A&P                          Initial arrival blood pressure was about 741 systolic heart rate of 63A.  Active GI bleeding seen on patient's close and dependence.  Labs show hemoglobin 7.1 which is lower than her baseline hemoglobin around 9.5.  Patient ordered 1 unit of blood transfusion and liter bolus of fluid hydration.  Patient is due for dialysis today, case discussed with on-call nephrologist Dr. Louie Boston as well as on-call GI Dr.Carver.   Final Clinical Impression(s) / ED Diagnoses Final diagnoses:  Rectal bleeding  Severe anemia    Rx / DC Orders ED Discharge Orders    None       Luna Fuse, MD 07/22/20 1011

## 2020-07-22 NOTE — H&P (Addendum)
History and Physical  [place] DHR:[id number] DOB: 1935/05/08 DOA: 07/22/2020  PCP: Hendricks Limes, MD  Patient coming from: [facility]  Level of care: Stepdown  I have personally briefly reviewed patient's old medical records in [place]  Chief Complaint: rectal bleeding, black stools AND coughing up blood  HPI: [name] is a 85 y.o. female with medical history significant for recent hip fracture repair discharged to [facility] on aspirin 81 mg twice daily for DVT prophylaxis.  Patient has end-stage renal disease hemodialysis Monday Wednesday Friday.  Patient has type 2 diabetes mellitus, depression, dementia, hypertension, glaucoma, breast cancer.  She was sent to the emergency department from EMS from [facility] with symptoms of hematemesis, bright red blood in stool and black stools.  Was noted by the nursing staff this morning at the [facility].  She denies symptoms of chest pain and shortness of breath.  She has had a mild productive cough for the past several days whitish sputum production but no fever or chills.  Hemoglobin was 9.4 on 07/17/2020 and dropped down to 7.1 today.  Patient was noted to be guaiac positive.  SARS 2 coronavirus test negative.  Patient was typed and crossed and 1 unit of packed red blood cells ordered.  Patient was started on IV Protonix infusion and GI was consulted.  Nephrology was also consulted for hemodialysis treatment which is scheduled for today.  The patient denies having abdominal pain.  She mostly complains of peripheral neuropathy pain.  Patient does have a history of multiple gastric ulcers.  Review of Systems: Review of Systems  Constitutional: Positive for malaise/fatigue.  HENT: Positive for congestion. Negative for ear discharge, ear pain, hearing loss, nosebleeds, sinus pain and tinnitus.   Eyes: Negative.   Respiratory: Positive for cough and sputum production. Negative for hemoptysis, shortness of  breath, wheezing and stridor.   Cardiovascular: Negative.   Gastrointestinal: Positive for heartburn and nausea.  Genitourinary: Negative.   Musculoskeletal: Negative.   Skin: Negative.   Neurological: Negative.   Endo/Heme/Allergies: Negative.   Psychiatric/Behavioral: Negative.   All other systems reviewed and are negative. (limited by underlying dementia)   Past Medical History:  Diagnosis Date  . Anemia     chronic macrocytic anemia  . Anxiety   . Chronic kidney disease   . Chronic renal disease, stage 4, severely decreased glomerular filtration rate (GFR) between 15-29 mL/min/1.73 square meter (HCC) 08/22/2015  . Complication of anesthesia    delirious after Breast Surgery  . Dementia ([place])    mild  . Depression   . Diabetes mellitus with ESRD (end-stage renal disease) ([place])    type II  . Dyspnea    with activity  . GERD (gastroesophageal reflux disease)   . Glaucoma   . Hypertension   . Pneumonia   . Stage 1 infiltrating ductal carcinoma of right female breast ([place]) 08/21/2015   ER+ PR+ HER 2 neu + (3+) T1cN0     Past Surgical History:  Procedure Laterality Date  . A/V FISTULAGRAM Right 07/05/2020   Procedure: A/V Fistulagram;  Surgeon: Elam Dutch, MD;  Location: [place] [facility] LAB;  Service: Cardiovascular;  Laterality: Right;  . AV FISTULA PLACEMENT Left 11/22/2017   Procedure: ARTERIOVENOUS (AV) FISTULA CREATION LEFT ARM;  Surgeon: Elam Dutch, MD;  Location: [facility] OR;  Service: Vascular;  Laterality: Left;  . AV FISTULA PLACEMENT Right 04/04/2020   Procedure: RIGHT ARM ARTERIOVENOUS FISTULA CREATION;  Surgeon: Rosetta Posner, MD;  Location: [facility] OR[facility];  Service: Vascular;  Laterality: Right;  . BIOPSY  08/07/2016   Procedure: BIOPSY;  Surgeon: Daneil Dolin, MD;  [facility];  Service: Endoscopy;;  gastric ulcer biopsy  . COLONOSCOPY    . ESOPHAGOGASTRODUODENOSCOPY N/A 08/07/2016   Procedure: ESOPHAGOGASTRODUODENOSCOPY (EGD);  Surgeon: Daneil Dolin, MD;  [facility];  Service: Endoscopy;  Laterality: N/A;  1215-rescheduled to 6/1 @ 2:30pm per Tretha Sciara  . ESOPHAGOGASTRODUODENOSCOPY N/A 11/27/2016   Procedure: ESOPHAGOGASTRODUODENOSCOPY (EGD);  Surgeon: Daneil Dolin, MD;  [facility];  Service: Endoscopy;  Laterality: N/A;  8:15am  . FISTULA SUPERFICIALIZATION Left 02/14/2018   Procedure: FISTULA SUPERFICIALIZATION LEFT ARM;  Surgeon: Angelia Mould, MD;  Location: [place];  Service: Vascular;  Laterality: Left;  . FRACTURE SURGERY Right    ankle  . INTRAMEDULLARY (IM) NAIL INTERTROCHANTERIC Right 07/12/2020   Procedure: INTRAMEDULLARY (IM) NAIL INTERTROCHANTRIC;  Surgeon: Mordecai Rasmussen, MD;  Location: [facility];  Service: Orthopedics;  Laterality: Right;  . MALONEY DILATION N/A 08/07/2016   Procedure: Venia Minks DILATION;  Surgeon: Daneil Dolin, MD;  [facility];  Service: Endoscopy;  Laterality: N/A;  . MASTECTOMY, PARTIAL Right   . PERIPHERAL VASCULAR BALLOON ANGIOPLASTY Left 07/13/2019   Procedure: PERIPHERAL VASCULAR BALLOON ANGIOPLASTY;  Surgeon: Marty Heck, MD;  Location: [place] [facility] LAB;  Service: Cardiovascular;  Laterality: Left;  arm fistulogram  . PERIPHERAL VASCULAR BALLOON ANGIOPLASTY Right 05/22/2020   Procedure: PERIPHERAL VASCULAR BALLOON ANGIOPLASTY;  Surgeon: Cherre Robins, MD;  Location: [place] [facility] LAB;  Service: Cardiovascular;  Laterality: Right;  arm fistula  . PERIPHERAL VASCULAR BALLOON ANGIOPLASTY Right 07/05/2020   Procedure: PERIPHERAL VASCULAR BALLOON ANGIOPLASTY;  Surgeon: Elam Dutch, MD;  Location: [place] [facility] LAB;  Service: Cardiovascular;  Laterality: Right;  arm fistula  . PORT-A-CATH REMOVAL Left 11/22/2017   Procedure: REMOVAL PORT-A-CATH LEFT CHEST;  Surgeon: Elam Dutch, MD;  Location: [facility] OR;  Service: Vascular;  Laterality: Left;  . RETINAL DETACHMENT SURGERY Right      reports that she has never smoked. She has never used smokeless  tobacco. She reports that she does not drink alcohol and does not use drugs.  Allergies  Allergen Reactions  . Ace Inhibitors Other (See Comments) and Cough    Tongue swell , ie angioedema  . Angiotensin Receptor Blockers     Angioedema with ACE-I  . Penicillins Other (See Comments)    Unsure of reaction Has patient had a PCN reaction causing immediate rash, facial/tongue/throat swelling, SOB or lightheadedness with hypotension: Unknown Has patient had a PCN reaction causing severe rash involving mucus membranes or skin necrosis: Unknown Has patient had a PCN reaction that required hospitalization: No Has patient had a PCN reaction occurring within the last 10 years: Unknown If all of the above answers are "NO", then may proceed with Cephalosporin use.      Family History  Problem Relation Age of Onset  . Multiple myeloma Sister   . Brain cancer Sister   . Dementia Mother        died at 7  . Stroke Mother   . Heart failure Mother   . Diabetes Mother   . Heart disease Father   . Prostate cancer Brother   . Colon cancer Neg Hx     Prior to Admission medications   Medication Sig Start Date End Date Taking? Authorizing Provider  acetaminophen (TYLENOL)  650 MG CR tablet Take 650 mg by mouth every 6 (six) hours. 01/09/20  Yes [provider]  acyclovir (ZOVIRAX) 200 MG capsule Take 200 mg by mouth daily. 10/20/19  Yes [provider]  amLODipine (NORVASC) 10 MG tablet Take 10 mg by mouth daily. 01/25/18  Yes [provider]  anastrozole (ARIMIDEX) 1 MG tablet TAKE 1 TABLET BY MOUTH DAILY Patient taking differently: Take 1 mg by mouth daily. 10/30/19  Yes Derek Jack, MD  aspirin EC 81 MG EC tablet Take 1 tablet (81 mg total) by mouth 2 (two) times daily. Swallow whole. 07/17/20  Yes Emokpae, Courage, MD  atorvastatin (LIPITOR) 10 MG tablet Take 10 mg by mouth at bedtime.   Yes [provider]  Calcium Acetate 667 MG TABS Take 667 mg by  mouth daily. Take 1 tablet w/ snacks daily   Yes [provider]  Calcium Acetate 667 MG TABS Take 1,334 mg by mouth in the morning and at bedtime. 2 tabs with meals   Yes [provider]  calcium-vitamin D (OSCAL WITH D) 500-200 MG-UNIT tablet Take 1 tablet by mouth daily. At 8 am   Yes [provider]  cloNIDine (CATAPRES) 0.3 MG tablet Take 0.3 mg by mouth 2 (two) times daily.    Yes [provider]  insulin glargine (LANTUS SOLOSTAR) 100 UNIT/ML Solostar Pen Inject 10 Units into the skin at bedtime. 01/23/20  Yes [provider]  insulin lispro (HUMALOG) 100 UNIT/ML KwikPen Inject 100 Units into the skin in the morning and at bedtime. 10 units, subcutaneous, Twice A Day, Hold for accu-check less than 150. Do not give more than 10 minutes before eating   Yes [provider]  insulin lispro (HUMALOG) 100 UNIT/ML KwikPen Inject 15 Units into the skin daily. Hold for accu-check less than 150. Do not give more than 10 minutes before eating   Yes [provider]  melatonin 3 MG TABS tablet Take 6 mg by mouth at bedtime. For Sleep 10/17/19  Yes [provider]  Multiple Vitamin (MULTIVITAMIN) capsule Take 1 capsule by mouth daily.   Yes [provider]  sertraline (ZOLOFT) 50 MG tablet Take 50 mg by mouth daily.   Yes [provider]  NON FORMULARY Diet- renal carb modified 1200cc fluid restriction    [provider]    Physical Exam: Vitals:   07/22/20 0757 07/22/20 0759 07/22/20 0801 07/22/20 0912  BP: (!) 115/52   (!) 102/51  Pulse: 95   90  Resp: (!) 26   (!) 21  Temp: 98.5 F (36.9 C)     TempSrc: Oral     SpO2: 96% 96%  99%  Weight:   68 kg   Height:   _0  (1.6 m)     Constitutional: Chronically ill, elderly female, awake, oriented to person and place, NAD, calm, comfortable Eyes: PERRL, lids and conjunctivae normal ENMT: Mucous membranes are pale and dry. Posterior pharynx clear of any  exudate or lesions.  poor dentition Neck: normal, supple, no masses, no thyromegaly Respiratory: clear to auscultation bilaterally, no wheezing, no crackles. Normal respiratory effort. No accessory muscle use.  Cardiovascular: normal s1, s2 sounds. No extremity edema. 2+ pedal pulses. No carotid bruits.  Abdomen: no tenderness, no masses palpated. No hepatosplenomegaly. Bowel sounds positive.  Musculoskeletal: no clubbing / cyanosis.  Allodynia both feet.  No joint deformity upper and lower extremities. Good ROM, no contractures. Normal muscle tone.  Skin: no rashes, lesions, ulcers.  No induration Neurologic: CN 2-12 grossly intact. Sensation intact, DTR normal. Strength 5/5 in all 4.  Psychiatric: Normal judgment and insight. Alert and oriented x 3. Normal mood.   Labs on Admission: I have personally reviewed following labs and imaging studies  CBC: Recent Labs  Lab 07/16/20 0634 07/17/20 0540 07/22/20 0841  WBC 12.5* 11.5* 11.6*  NEUTROABS  --   --  9.2*  HGB 9.3* 9.4* 7.1*  HCT 29.1* 29.8* 23.5*  MCV 100.7* 100.3* 104.0*  PLT 293 300 366*   Basic Metabolic Panel: Recent Labs  Lab 07/16/20 0634 07/17/20 0540 07/22/20 0841  NA 135 135 140  K 4.4 4.2 4.5  CL 94* 95* 96*  CO2 _0 GLUCOSE 124* 140* 214*  BUN 40* 68* 97*  CREATININE 5.68* 8.04* 9.18*  CALCIUM 8.5* 8.5* 8.0*   GFR: Estimated Creatinine Clearance: 4.4 mL/min (A) (by C-G formula based on SCr of 9.18 mg/dL (H)). Liver Function Tests: No results for input(s): AST, ALT, ALKPHOS, BILITOT, PROT, ALBUMIN in the last 168 hours. No results for input(s): LIPASE, AMYLASE in the last 168 hours. No results for input(s): AMMONIA in the last 168 hours. Coagulation Profile: No results for input(s): INR, PROTIME in the last 168 hours. Cardiac Enzymes: No results for input(s): CKTOTAL, CKMB, CKMBINDEX, TROPONINI in the last 168 hours. BNP (last 3 results) No results for input(s): PROBNP in the last 8760  hours. HbA1C: No results for input(s): HGBA1C in the last 72 hours. CBG: Recent Labs  Lab 07/17/20 0614 07/17/20 0619 07/17/20 0809 07/17/20 1415 07/17/20 1553  GLUCAP 137* 140* 123* 119* 243*   Lipid Profile: No results for input(s): CHOL, HDL, LDLCALC, TRIG, CHOLHDL, LDLDIRECT in the last 72 hours. Thyroid Function Tests: No results for input(s): TSH, T4TOTAL, FREET4, T3FREE, THYROIDAB in the last 72 hours. Anemia Panel: No results for input(s): VITAMINB12, FOLATE, FERRITIN, TIBC, IRON, RETICCTPCT in the last 72 hours. Urine analysis: No results found for: COLORURINE, APPEARANCEUR, LABSPEC, PHURINE, GLUCOSEU, HGBUR, BILIRUBINUR, KETONESUR, PROTEINUR, UROBILINOGEN, NITRITE, LEUKOCYTESUR  Radiological Exams on Admission: No results found.  Assessment/Plan Principal Problem:   Acute upper GI bleed Active Problems:   Aromatase inhibitor use   Lymphedema of arm   Dysphagia   GERD (gastroesophageal reflux disease)   Multiple gastric ulcers   Iron deficiency anemia   Multiple myeloma (HCC)   Diabetes mellitus with ESRD (end-stage renal disease) ([place])   Hypertension due to end stage renal disease caused by type 2 diabetes mellitus, on dialysis ([place])   Anemia due to end stage renal disease (HCC)   Herpes   Vascular dementia with behavior disturbance (HCC)   Dependence on renal dialysis ([place])   Aortic atherosclerosis (HCC)   Atherosclerotic peripheral vascular disease (HCC)   Hyperlipidemia associated with type 2 diabetes mellitus ([place])   Closed right hip fracture (HCC)   ESRD (end stage renal disease) ([place])   Major depression, recurrent, chronic (HCC)   Rectal bleeding   Acute blood loss anemia   1. Acute GI bleeding -suspected upper GI bleeding source however with bright red rectal bleeding and clots that we have seen could also be having lower GI bleeding source as well.  Agree with holding aspirin.  Agree with IV Protonix infusion.  Admit to stepdown ICU and continue  supportive therapies.  Transfuse PRBC as ordered.  Follow hemoglobin closely. 2. Acute blood loss anemia-patient is being transfused packed red blood cells.  Follow CBC.  Transfuse as needed. 3. End-stage renal disease on  hemodialysis-patient is due for hemodialysis today and nephrology has been consulted.  Can give PRBC during HD if possible otherwise get through IV. 4. Status post right hip fracture and repair- should have staples removed in about 5 days on 07/27/2020 (2 weeks postop) 5. 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 6. Essential hypertension -patient has had some soft blood pressures and being resuscitated with IV fluid bolus and PRBC transfusion.  Resume home blood pressure medications with holding parameters for soft BP meds.  Also receiving HD today likely could restart BP meds tomorrow which would be a nondialysis day. 7. History of multiple gastric ulcers- patient likely had a poor response to twice daily aspirin and could have recurrence of gastric ulcers leading.  DC aspirin as above.  Continue IV Protonix infusion and GI consultation has been requested. 8. History of breast cancer- patient has been maintained on aromatase inhibitor therapy. 9. Vascular dementia - delirium precautions.    DVT prophylaxis: SCD Code Status: Full   Family Communication: will attempt phone call   Disposition Plan: return to SNF when medically stabilized   Consults called: GI, Nephrology   Admission status: INP   Level of care: Stepdown Irwin Brakeman MD [facility] How to contact the [facility] Attending or Consulting provider [place] or covering provider during after hours [place], for this patient?  1. Check the care team in [facility] and look for a) attending/consulting [facility] provider listed and b) the [facility] team listed 2. Log into [email or web address] and use [facility]'s universal password to access. If you do not have the password, please contact the hospital operator. 3. Locate the [facility] provider you are looking for under [facility] and page to a number that you can be directly reached. 4. If you still have difficulty reaching the provider, please page the [facility] (Director on Call) for the Hospitalists listed on amion for assistance.   If 7PM-7AM, please contact night-coverage [email or web address] Password [facility]  07/22/2020, 11:24 AM

## 2020-07-22 NOTE — ED Notes (Signed)
Spoke with pt's sister Carly Wood to provide update.

## 2020-07-22 NOTE — ED Notes (Signed)
hospitalist at bedside. Prior to his arrival, pt noted to cough and began vomiting up moderate amount of bloody emesis with large clots noted. Assisted pt to upright position and suctioned mouth. Gown changed. Brief changed as well d/t to dark red stool occurrence. Dr. Wynetta Emery notified of same.

## 2020-07-22 NOTE — Procedures (Signed)
   HEMODIALYSIS TREATMENT NOTE:  Uneventful 3.5 hour heparin-free HD completed via left IJ TDC. Goal met: 1 liter removed.  One unit pRBC transfused without problems.  All blood was returned.  Rockwell Alexandria, RN

## 2020-07-22 NOTE — Consult Note (Signed)
Reason for Consult: To manage dialysis and dialysis related needs Referring Physician: Neima Lacross is an 85 y.o. female with past medical history significant for diabetes mellitus, hypertension, ESRD-Monday Wednesday Friday at [facility], dementia and remote history of breast cancer.  She is status post hospitalization here from 5/5-5/11 for hip fracture, discharged to [place] home.  She reestablished with her home dialysis unit, last had dialysis there on 5/13 leaving out a kilo under her dry weight.  She is brought back to the emergency department early this morning with complaints of bloody stool and coffee-ground emesis.  Her hemoglobin is noted to be 7.1, down from 9.4 5 days ago.  She is laying in bed without complaint although has evidence of coffee-ground emesis on her gown.  She has pleasant dementia.  Her TDC is without dressing right now-but is clean and dry and nontender   Dialyzes at [facility] Monday Wednesday Friday-3-1/2 hours EDW 65 but left out at 64 on 5/13. HD Bath 1K/2.5 calcium, Dialyzer unknown, Heparin yes. Access TDC, 350 blood flow rate/500 DFR.  Epogen 5600 3 times weekly, Hectorol 2 mcg 3 times weekly, Venofer 50 weekly  Past Medical History:  Diagnosis Date  . Anemia     chronic macrocytic anemia  . Anxiety   . Chronic kidney disease   . Chronic renal disease, stage 4, severely decreased glomerular filtration rate (GFR) between 15-29 mL/min/1.73 square meter (HCC) 08/22/2015  . Complication of anesthesia    delirious after Breast Surgery  . Dementia ([place])    mild  . Depression   . Diabetes mellitus with ESRD (end-stage renal disease) ([place])    type II  . Dyspnea    with activity  . GERD (gastroesophageal reflux disease)   . Glaucoma   . Hypertension   . Pneumonia   . Stage 1 infiltrating ductal carcinoma of right female breast ([place]) 08/21/2015   ER+ PR+ HER 2 neu + (3+) T1cN0     Past Surgical History:  Procedure Laterality Date  .  A/V FISTULAGRAM Right 07/05/2020   Procedure: A/V Fistulagram;  Surgeon: Elam Dutch, MD;  Location: [place] CV LAB;  Service: Cardiovascular;  Laterality: Right;  . AV FISTULA PLACEMENT Left 11/22/2017   Procedure: ARTERIOVENOUS (AV) FISTULA CREATION LEFT ARM;  Surgeon: Elam Dutch, MD;  Location: [facility] OR;  Service: Vascular;  Laterality: Left;  . AV FISTULA PLACEMENT Right 04/04/2020   Procedure: RIGHT ARM ARTERIOVENOUS FISTULA CREATION;  Surgeon: Rosetta Posner, MD;  Location: [facility] ORS;  Service: Vascular;  Laterality: Right;  . BIOPSY  08/07/2016   Procedure: BIOPSY;  Surgeon: Daneil Dolin, MD;  Location: [facility] ENDO SUITE;  Service: Endoscopy;;  gastric ulcer biopsy  . COLONOSCOPY    . ESOPHAGOGASTRODUODENOSCOPY N/A 08/07/2016   Procedure: ESOPHAGOGASTRODUODENOSCOPY (EGD);  Surgeon: Daneil Dolin, MD;  Location: [facility] ENDO SUITE;  Service: Endoscopy;  Laterality: N/A;  1215-rescheduled to 6/1 @ 2:30pm per Tretha Sciara  . ESOPHAGOGASTRODUODENOSCOPY N/A 11/27/2016   Procedure: ESOPHAGOGASTRODUODENOSCOPY (EGD);  Surgeon: Daneil Dolin, MD;  [facility];  Service: Endoscopy;  Laterality: N/A;  8:15am  . FISTULA SUPERFICIALIZATION Left 02/14/2018   Procedure: FISTULA SUPERFICIALIZATION LEFT ARM;  Surgeon: Angelia Mould, MD;  Location: [place];  Service: Vascular;  Laterality: Left;  . FRACTURE SURGERY Right    ankle  . INTRAMEDULLARY (IM) NAIL INTERTROCHANTERIC Right 07/12/2020   Procedure: INTRAMEDULLARY (IM) NAIL INTERTROCHANTRIC;  Surgeon: Mordecai Rasmussen, MD;  Location:  [facility] ORS;  Service: Orthopedics;  Laterality: Right;  . MALONEY DILATION N/A 08/07/2016   Procedure: Venia Minks DILATION;  Surgeon: Daneil Dolin, MD;  Location: [facility];  Service: Endoscopy;  Laterality: N/A;  . MASTECTOMY, PARTIAL Right   . PERIPHERAL VASCULAR BALLOON ANGIOPLASTY Left 07/13/2019   Procedure: PERIPHERAL VASCULAR BALLOON ANGIOPLASTY;  Surgeon: Marty Heck, MD;  Location: [place] [facility]  LAB;  Service: Cardiovascular;  Laterality: Left;  arm fistulogram  . PERIPHERAL VASCULAR BALLOON ANGIOPLASTY Right 05/22/2020   Procedure: PERIPHERAL VASCULAR BALLOON ANGIOPLASTY;  Surgeon: Cherre Robins, MD;  Location: [place] CV LAB;  Service: Cardiovascular;  Laterality: Right;  arm fistula  . PERIPHERAL VASCULAR BALLOON ANGIOPLASTY Right 07/05/2020   Procedure: PERIPHERAL VASCULAR BALLOON ANGIOPLASTY;  Surgeon: Elam Dutch, MD;  Location: [facility] CV LAB;  Service: Cardiovascular;  Laterality: Right;  arm fistula  . PORT-A-CATH REMOVAL Left 11/22/2017   Procedure: REMOVAL PORT-A-CATH LEFT CHEST;  Surgeon: Elam Dutch, MD;  Location: [facility] OR;  Service: Vascular;  Laterality: Left;  . RETINAL DETACHMENT SURGERY Right     Family History  Problem Relation Age of Onset  . Multiple myeloma Sister   . Brain cancer Sister   . Dementia Mother        died at 76  . Stroke Mother   . Heart failure Mother   . Diabetes Mother   . Heart disease Father   . Prostate cancer Brother   . Colon cancer Neg Hx     Social History:  reports that she has never smoked. She has never used smokeless tobacco. She reports that she does not drink alcohol and does not use drugs.  Allergies:  Allergies  Allergen Reactions  . Ace Inhibitors Other (See Comments) and Cough    Tongue swell , ie angioedema  . Angiotensin Receptor Blockers     Angioedema with ACE-I  . Penicillins Other (See Comments)    Unsure of reaction Has patient had a PCN reaction causing immediate rash, facial/tongue/throat swelling, SOB or lightheadedness with hypotension: Unknown Has patient had a PCN reaction causing severe rash involving mucus membranes or skin necrosis: Unknown Has patient had a PCN reaction that required hospitalization: No Has patient had a PCN reaction occurring within the last 10 years: Unknown If all of the above answers are "NO", then may proceed with Cephalosporin use.      Medications: I  have reviewed the patient's current medications.   Results for orders placed or performed during the hospital encounter of 07/22/20 (from the past 48 hour(s))  Resp Panel by RT-PCR (Flu A&B, Covid) Nasopharyngeal Swab     Status: None   Collection Time: 07/22/20  8:22 AM   Specimen: Nasopharyngeal Swab; Nasopharyngeal(NP) swabs in vial transport medium  Result Value Ref Range   SARS Coronavirus 2 by RT PCR NEGATIVE NEGATIVE    Comment: (NOTE) SARS-CoV-2 target nucleic acids are NOT DETECTED.  The SARS-CoV-2 RNA is generally detectable in upper respiratory specimens during the acute phase of infection. The lowest concentration of SARS-CoV-2 viral copies this assay can detect is 138 copies/mL. A negative result does not preclude SARS-Cov-2 infection and should not be used as the sole basis for treatment or other patient management decisions. A negative result may occur with  improper specimen collection/handling, submission of specimen other than nasopharyngeal swab, presence of viral mutation(s) within the areas targeted by this assay, and inadequate number of viral copies(<138 copies/mL). A negative result must be combined with  clinical observations, patient history, and epidemiological information. The expected result is Negative.  Fact Sheet for Patients:  EntrepreneurPulse.com.au  Fact Sheet for Healthcare Providers:  IncredibleEmployment.be  This test is no t yet approved or cleared by the [place] FDA and  has been authorized for detection and/or diagnosis of SARS-CoV-2 by FDA under an Emergency Use Authorization (EUA). This EUA will remain  in effect (meaning this test can be used) for the duration of the COVID-19 declaration under Section 564(b)(1) of the Act, 21 U.S.C.section 360bbb-3(b)(1), unless the authorization is terminated  or revoked sooner.       Influenza A by PCR NEGATIVE NEGATIVE   Influenza B by PCR NEGATIVE  NEGATIVE    Comment: (NOTE) The Xpert Xpress SARS-CoV-2/FLU/RSV plus assay is intended as an aid in the diagnosis of influenza from Nasopharyngeal swab specimens and should not be used as a sole basis for treatment. Nasal washings and aspirates are unacceptable for Xpert Xpress SARS-CoV-2/FLU/RSV testing.  Fact Sheet for Patients: EntrepreneurPulse.com.au  Fact Sheet for Healthcare Providers: IncredibleEmployment.be  This test is not yet approved or cleared by the [place] FDA and has been authorized for detection and/or diagnosis of SARS-CoV-2 by FDA under an Emergency Use Authorization (EUA). This EUA will remain in effect (meaning this test can be used) for the duration of the COVID-19 declaration under Section 564(b)(1) of the Act, 21 U.S.C. section 360bbb-3(b)(1), unless the authorization is terminated or revoked.  Performed at [facility], [address] [place], [place] [address]   CBC with Differential     Status: Abnormal   Collection Time: 07/22/20  8:41 AM  Result Value Ref Range   WBC 11.6 (H) 4.0 - 10.5 K/uL   RBC 2.26 (L) 3.87 - 5.11 MIL/uL   Hemoglobin 7.1 (L) 12.0 - 15.0 g/dL   HCT 23.5 (L) 36.0 - 46.0 %   MCV 104.0 (H) 80.0 - 100.0 fL   MCH 31.4 26.0 - 34.0 pg   MCHC 30.2 30.0 - 36.0 g/dL   RDW 17.2 (H) 11.5 - 15.5 %   Platelets 444 (H) 150 - 400 K/uL   nRBC 0.2 0.0 - 0.2 %   Neutrophils Relative % 78 %   Neutro Abs 9.2 (H) 1.7 - 7.7 K/uL   Lymphocytes Relative 12 %   Lymphs Abs 1.3 0.7 - 4.0 K/uL   Monocytes Relative 7 %   Monocytes Absolute 0.8 0.1 - 1.0 K/uL   Eosinophils Relative 1 %   Eosinophils Absolute 0.1 0.0 - 0.5 K/uL   Basophils Relative 0 %   Basophils Absolute 0.0 0.0 - 0.1 K/uL   Immature Granulocytes 2 %   Abs Immature Granulocytes 0.17 (H) 0.00 - 0.07 K/uL    Comment: Performed at [facility], [address], [place], [place] [address]  Basic metabolic panel     Status: Abnormal   Collection  Time: 07/22/20  8:41 AM  Result Value Ref Range   Sodium 140 135 - 145 mmol/L   Potassium 4.5 3.5 - 5.1 mmol/L   Chloride 96 (L) 98 - 111 mmol/L   CO2 27 22 - 32 mmol/L   Glucose, Bld 214 (H) 70 - 99 mg/dL    Comment: Glucose reference range applies only to samples taken after fasting for at least 8 hours.   BUN 97 (H) 8 - 23 mg/dL   Creatinine, Ser 9.18 (H) 0.44 - 1.00 mg/dL   Calcium 8.0 (L) 8.9 - 10.3 mg/dL   GFR, Estimated 4 (L) >  60 mL/min    Comment: (NOTE) Calculated using the CKD-EPI Creatinine Equation (2021)    Anion gap 17 (H) 5 - 15    Comment: Performed at [facility], [address], [place], [place] [address]  Type and screen [facility]     Status: None (Preliminary result)   Collection Time: 07/22/20  8:42 AM  Result Value Ref Range   ABO/RH(D) A POS    Antibody Screen NEG    Sample Expiration 07/25/2020,2359    Unit Number [id number]    Blood Component Type RBC LR PHER2    Unit division 00    Status of Unit ALLOCATED    Transfusion Status OK TO TRANSFUSE    Crossmatch Result      Compatible Performed at [facility], [address] [place], [place] [address]   Prepare RBC (crossmatch)     Status: None   Collection Time: 07/22/20  8:42 AM  Result Value Ref Range   Order Confirmation      ORDER PROCESSED BY BLOOD BANK Performed at [facility], [address], [place], [place] [address]   POC occult blood, ED     Status: Abnormal   Collection Time: 07/22/20  8:50 AM  Result Value Ref Range   Fecal Occult Bld POSITIVE (A) NEGATIVE    No results found.  ROS: Really without complaint-however patient with dementia Blood pressure (!) 102/51, pulse 90, temperature 98.5 F (36.9 C), temperature source Oral, resp. rate (!) 21, height '5\' 3"'  (1.6 m), weight 68 kg, SpO2 99 %. General appearance: alert and no distress Resp: clear to auscultation bilaterally Cardio: regular rate and rhythm, S1, S2 normal, no murmur, click, rub or gallop GI: soft,  non-tender; bowel sounds normal; no masses,  no organomegaly Extremities: edema mild Neurologic: Mental status: Alert, oriented, thought content appropriate, alertness: alert, orientation: person, to person only  Left-sided TDC-does not have an occlusive dressing, some of the dressing is stained.  However exit site is clean and dry and tunnel is nontender  Assessment/Plan: 85 year old black female with many medical issues including ESRD.  She was hospitalized recently with hip fracture.  Now returns with GI bleed 1 GI bleed- has not been on anticoagulants but does get heparin with dialysis.  Has a history of gastric ulcer in the past.  Has been started on Protonix drip.  Per hospitalist and GI 2 ESRD: Normally Monday Wednesday Friday at [facility] via [facility].  Plan is to do routine dialysis today- no heparin.  Dialysis nurse Levada Dy has been informed 3 Hypertension: Normally on amlodipine 10 and clonidine 0.3 twice daily.  Blood pressure right now is low.  Will decrease dosages.  May be just trace edema-not much UF needed with dialysis today 4. Anemia of ESRD: Has this in the setting of CKD.  However, hemoglobin recently dropping 2 g in the setting of what appears to be an upper GI bleed.  Getting transfusion today.  Continue with ESA and monitor counts 5. Metabolic Bone Disease: We will continue home Hectorol.  PhosLo is listed on med list but will hold right now in the setting of GI bleed   Vivian Okelley A Quaniya Damas 07/22/2020, 11:22 AM

## 2020-07-23 ENCOUNTER — Inpatient Hospital Stay (HOSPITAL_COMMUNITY): Payer: Medicare PPO

## 2020-07-23 ENCOUNTER — Encounter (HOSPITAL_COMMUNITY): Payer: Self-pay | Admitting: Family Medicine

## 2020-07-23 ENCOUNTER — Ambulatory Visit (HOSPITAL_COMMUNITY): Payer: Medicare PPO

## 2020-07-23 ENCOUNTER — Encounter (HOSPITAL_COMMUNITY)
Admission: EM | Disposition: A | Discharge status: Skilled Nursing Facility | Payer: Self-pay | Source: Home / Self Care | Attending: Family Medicine

## 2020-07-23 ENCOUNTER — Ambulatory Visit (HOSPITAL_COMMUNITY): Payer: Medicare PPO | Admitting: Hematology

## 2020-07-23 ENCOUNTER — Inpatient Hospital Stay (HOSPITAL_COMMUNITY): Payer: Medicare PPO | Admitting: Certified Registered"

## 2020-07-23 DIAGNOSIS — K920 Hematemesis: Secondary | ICD-10-CM

## 2020-07-23 DIAGNOSIS — N186 End stage renal disease: Secondary | ICD-10-CM | POA: Diagnosis not present

## 2020-07-23 DIAGNOSIS — D62 Acute posthemorrhagic anemia: Secondary | ICD-10-CM | POA: Diagnosis not present

## 2020-07-23 DIAGNOSIS — K269 Duodenal ulcer, unspecified as acute or chronic, without hemorrhage or perforation: Secondary | ICD-10-CM

## 2020-07-23 DIAGNOSIS — K259 Gastric ulcer, unspecified as acute or chronic, without hemorrhage or perforation: Secondary | ICD-10-CM

## 2020-07-23 DIAGNOSIS — K625 Hemorrhage of anus and rectum: Secondary | ICD-10-CM | POA: Diagnosis not present

## 2020-07-23 DIAGNOSIS — K922 Gastrointestinal hemorrhage, unspecified: Secondary | ICD-10-CM | POA: Diagnosis not present

## 2020-07-23 DIAGNOSIS — D631 Anemia in chronic kidney disease: Secondary | ICD-10-CM

## 2020-07-23 DIAGNOSIS — I700 Atherosclerosis of aorta: Secondary | ICD-10-CM

## 2020-07-23 DIAGNOSIS — K2289 Other specified disease of esophagus: Secondary | ICD-10-CM

## 2020-07-23 HISTORY — PX: SCLEROTHERAPY: SHX6841

## 2020-07-23 HISTORY — PX: ESOPHAGOGASTRODUODENOSCOPY (EGD) WITH PROPOFOL: SHX5813

## 2020-07-23 HISTORY — PX: HOT HEMOSTASIS: SHX5433

## 2020-07-23 LAB — RENAL FUNCTION PANEL
Albumin: 2.9 g/dL — ABNORMAL LOW (ref 3.5–5.0)
Anion gap: 12 (ref 5–15)
BUN: 42 mg/dL — ABNORMAL HIGH (ref 8–23)
CO2: 28 mmol/L (ref 22–32)
Calcium: 8.1 mg/dL — ABNORMAL LOW (ref 8.9–10.3)
Chloride: 94 mmol/L — ABNORMAL LOW (ref 98–111)
Creatinine, Ser: 5.67 mg/dL — ABNORMAL HIGH (ref 0.44–1.00)
GFR, Estimated: 7 mL/min — ABNORMAL LOW (ref >60)
Glucose, Bld: 138 mg/dL — ABNORMAL HIGH (ref 70–99)
Phosphorus: 4.1 mg/dL (ref 2.5–4.6)
Potassium: 4.4 mmol/L (ref 3.5–5.1)
Sodium: 134 mmol/L — ABNORMAL LOW (ref 135–145)

## 2020-07-23 LAB — GLUCOSE, CAPILLARY
Glucose-Capillary: 131 mg/dL — ABNORMAL HIGH (ref 70–99)
Glucose-Capillary: 151 mg/dL — ABNORMAL HIGH (ref 70–99)
Glucose-Capillary: 155 mg/dL — ABNORMAL HIGH (ref 70–99)
Glucose-Capillary: 138 mg/dL — ABNORMAL HIGH (ref 70–99)
Glucose-Capillary: 128 mg/dL — ABNORMAL HIGH (ref 70–99)

## 2020-07-23 LAB — BASIC METABOLIC PANEL
Anion gap: 16 — ABNORMAL HIGH (ref 5–15)
BUN: 38 mg/dL — ABNORMAL HIGH (ref 8–23)
CO2: 27 mmol/L (ref 22–32)
Calcium: 8.4 mg/dL — ABNORMAL LOW (ref 8.9–10.3)
Chloride: 93 mmol/L — ABNORMAL LOW (ref 98–111)
Creatinine, Ser: 4.76 mg/dL — ABNORMAL HIGH (ref 0.44–1.00)
GFR, Estimated: 9 mL/min — ABNORMAL LOW (ref >60)
Glucose, Bld: 162 mg/dL — ABNORMAL HIGH (ref 70–99)
Potassium: 3.7 mmol/L (ref 3.5–5.1)
Sodium: 136 mmol/L (ref 135–145)

## 2020-07-23 LAB — CBC
HCT: 24.8 % — ABNORMAL LOW (ref 36.0–46.0)
HCT: 23.5 % — ABNORMAL LOW (ref 36.0–46.0)
Hemoglobin: 7.8 g/dL — ABNORMAL LOW (ref 12.0–15.0)
Hemoglobin: 7.5 g/dL — ABNORMAL LOW (ref 12.0–15.0)
MCH: 31.2 pg (ref 26.0–34.0)
MCH: 31.8 pg (ref 26.0–34.0)
MCHC: 31.5 g/dL (ref 30.0–36.0)
MCHC: 31.9 g/dL (ref 30.0–36.0)
MCV: 99.2 fL (ref 80.0–100.0)
MCV: 99.6 fL (ref 80.0–100.0)
Platelets: 390 10*3/uL (ref 150–400)
Platelets: 382 10*3/uL (ref 150–400)
RBC: 2.5 MIL/uL — ABNORMAL LOW (ref 3.87–5.11)
RBC: 2.36 MIL/uL — ABNORMAL LOW (ref 3.87–5.11)
RDW: 18.6 % — ABNORMAL HIGH (ref 11.5–15.5)
RDW: 19.1 % — ABNORMAL HIGH (ref 11.5–15.5)
WBC: 21.4 10*3/uL — ABNORMAL HIGH (ref 4.0–10.5)
WBC: 18.3 10*3/uL — ABNORMAL HIGH (ref 4.0–10.5)
nRBC: 0.1 % (ref 0.0–0.2)
nRBC: 0.1 % (ref 0.0–0.2)

## 2020-07-23 LAB — PHOSPHORUS: Phosphorus: 2.8 mg/dL (ref 2.5–4.6)

## 2020-07-23 LAB — MAGNESIUM: Magnesium: 2 mg/dL (ref 1.7–2.4)

## 2020-07-23 SURGERY — ESOPHAGOGASTRODUODENOSCOPY (EGD) WITH PROPOFOL
Anesthesia: General

## 2020-07-23 MED ORDER — LIDOCAINE HCL (PF) 2 % IJ SOLN
INTRAMUSCULAR | Status: AC
  Filled 2020-07-23: qty 5

## 2020-07-23 MED ORDER — LACTATED RINGERS IV SOLN: INTRAVENOUS | Status: DC

## 2020-07-23 MED ORDER — PROPOFOL 10 MG/ML IV BOLUS
INTRAVENOUS | Status: DC | PRN
  Administered 2020-07-23: 60 mg via INTRAVENOUS
  Administered 2020-07-23: 100 ug/kg/min via INTRAVENOUS

## 2020-07-23 MED ORDER — EPINEPHRINE 1 MG/10ML IJ SOSY
PREFILLED_SYRINGE | INTRAMUSCULAR | Status: AC
  Filled 2020-07-23: qty 10

## 2020-07-23 MED ORDER — STERILE WATER FOR IRRIGATION IR SOLN
Status: DC | PRN
  Administered 2020-07-23: 100 mL

## 2020-07-23 MED ORDER — DOXYCYCLINE HYCLATE 100 MG PO TABS
100.0000 mg | ORAL_TABLET | Freq: Two times a day (BID) | ORAL | Status: DC
  Administered 2020-07-23: 100 mg via ORAL
  Filled 2020-07-23: qty 1

## 2020-07-23 MED ORDER — ADULT MULTIVITAMIN W/MINERALS CH
1.0000 | ORAL_TABLET | Freq: Every day | ORAL | Status: DC
  Administered 2020-07-24 – 2020-07-26 (×3): 1 via ORAL
  Filled 2020-07-23 (×3): qty 1

## 2020-07-23 MED ORDER — CHLORHEXIDINE GLUCONATE CLOTH 2 % EX PADS
6.0000 | MEDICATED_PAD | Freq: Every day | CUTANEOUS | Status: DC
  Administered 2020-07-23 – 2020-07-24 (×2): 6 via TOPICAL

## 2020-07-23 MED ORDER — LACTATED RINGERS IV SOLN: INTRAVENOUS | Status: DC | PRN

## 2020-07-23 MED ORDER — SODIUM CHLORIDE 0.9 % IV SOLN: INTRAVENOUS | Status: DC

## 2020-07-23 MED ORDER — SODIUM CHLORIDE (PF) 0.9 % IJ SOLN
PREFILLED_SYRINGE | INTRAMUSCULAR | Status: DC | PRN
  Administered 2020-07-23: 4 mL

## 2020-07-23 MED ORDER — SUCRALFATE 1 GM/10ML PO SUSP
1.0000 g | Freq: Three times a day (TID) | ORAL | Status: DC
  Administered 2020-07-23 – 2020-07-26 (×13): 1 g via ORAL
  Filled 2020-07-23 (×14): qty 10

## 2020-07-23 MED ORDER — LIDOCAINE HCL (CARDIAC) PF 100 MG/5ML IV SOSY
PREFILLED_SYRINGE | INTRAVENOUS | Status: DC | PRN
  Administered 2020-07-23: 60 mg via INTRAVENOUS

## 2020-07-23 NOTE — Progress Notes (Signed)
Subjective:  HD late yesterday-  Removed close to a liter-  hgb up from 7.1 to 7.8 with one unit.  She is alert-  Not c/o anything  Objective Vital signs in last 24 hours: Vitals:   07/23/20 0400 07/23/20 0500 07/23/20 0600 07/23/20 0736  BP: (!) 110/33 (!) 146/41 (!) 140/50   Pulse: (!) 108 (!) 109 (!) 105   Resp: (!) 21 11 14 10   Temp: 98.5 F (36.9 C)   98.1 F (36.7 C)  TempSrc: Oral   Oral  SpO2: 98% 100% 100%   Weight:  64.5 kg    Height:       Weight change:   Intake/Output Summary (Last 24 hours) at 07/23/2020 0752 Last data filed at 07/23/2020 0600 Gross per 24 hour  Intake 627.14 ml  Output 992 ml  Net -364.86 ml    Dialyzes at [facility] Monday Wednesday Friday-3-1/2 hours EDW 65 but left out at 64 on 5/13. HD Bath 1K/2.5 calcium, Dialyzer unknown, Heparin yes. Access TDC, 350 blood flow rate/500 DFR.  Epogen 5600 3 times weekly, Hectorol 2 mcg 3 times weekly, Venofer 50 weekly   Assessment/Plan: 85 year old black female with many medical issues including ESRD.  She was hospitalized recently with hip fracture.  Now returns with GI bleed 1 GI bleed- has not been on anticoagulants but does get heparin with dialysis.  Has a history of gastric ulcer in the past.  Has been started on Protonix drip.  Per hospitalist and GI-  Planning on possible EGD today  2 ESRD: Normally Monday Wednesday Friday at [facility] via [facility].  Done yesterday. Plan is to do routine dialysis again tomorrow- no heparin.   3 Hypertension: Normally on amlodipine 10 and clonidine 0.3 twice daily.  Blood pressure here was low.  Have decreased dosages.  May be just trace edema-not much UF needed with dialysis  4. Anemia of ESRD: Has this in the setting of CKD.  However, hemoglobin recently dropping 2 g in the setting of what appears to be an upper GI bleed.  Got transfused one unit yest with approp bump.  Continue with ESA and monitor counts 5. Metabolic Bone Disease:  continue home Hectorol.  PhosLo is  listed on med list but will hold right now in the setting of GI bleed and low phos    Gearldine Looney A Cyrus [place]    Labs: Basic Metabolic Panel: Recent Labs  Lab 07/17/20 0540 07/22/20 0841 07/23/20 0525  NA 135 140 136  K 4.2 4.5 3.7  CL 95* 96* 93*  CO2 26 27 27   GLUCOSE 140* 214* 162*  BUN 68* 97* 38*  CREATININE 8.04* 9.18* 4.76*  CALCIUM 8.5* 8.0* 8.4*  PHOS  --   --  2.8   Liver Function Tests: No results for input(s): AST, ALT, ALKPHOS, BILITOT, PROT, ALBUMIN in the last 168 hours. No results for input(s): LIPASE, AMYLASE in the last 168 hours. No results for input(s): AMMONIA in the last 168 hours. CBC: Recent Labs  Lab 07/17/20 0540 07/22/20 0841 07/23/20 0525  WBC 11.5* 11.6* 21.4*  NEUTROABS  --  9.2*  --   HGB 9.4* 7.1* 7.8*  HCT 29.8* 23.5* 24.8*  MCV 100.3* 104.0* 99.2  PLT 300 444* 390   Cardiac Enzymes: No results for input(s): CKTOTAL, CKMB, CKMBINDEX, TROPONINI in the last 168 hours. CBG: Recent Labs  Lab 07/22/20 1239 07/22/20 1606 07/22/20 2137 07/23/20 0300 07/23/20 0738  GLUCAP 175* 148* 137* 131* 151*  Iron Studies: No results for input(s): IRON, TIBC, TRANSFERRIN, FERRITIN in the last 72 hours. Studies/Results: No results found. Medications: Infusions: . sodium chloride    . sodium chloride    . pantoprozole (PROTONIX) infusion 8 mg/hr (07/23/20 0600)    Scheduled Medications: . acyclovir  200 mg Oral Daily  . amLODipine  5 mg Oral Daily  . anastrozole  1 mg Oral Daily  . atorvastatin  10 mg Oral QHS  . calcium acetate  1,334 mg Oral BID WC  . calcium-vitamin D  1 tablet Oral Daily  . Chlorhexidine Gluconate Cloth  6 each Topical Q0600  . cloNIDine  0.2 mg Oral BID  . darbepoetin (ARANESP) injection - DIALYSIS  150 mcg Intravenous Q Mon-HD  . doxercalciferol  2 mcg Intravenous Q M,W,F-HD  . doxycycline  100 mg Oral Q12H  . insulin aspart  0-6 Units Subcutaneous TID WC  . insulin aspart  8 Units Subcutaneous TID WC  .  insulin glargine  10 Units Subcutaneous QHS  . melatonin  6 mg Oral QHS  . multivitamin with minerals  1 tablet Oral Q lunch  . sertraline  50 mg Oral Daily    have reviewed scheduled and prn medications.  Physical Exam: General: alert, mittens-  One word answers- NAD Heart: RRR Lungs: mostly clear Abdomen: soft, non tender Extremities: min edema Dialysis Access: left sided TDC-  Clean and dry     07/23/2020,7:52 AM  LOS: 1 day

## 2020-07-23 NOTE — Progress Notes (Signed)
Subjective:  Patient has no complaints.  She denies chest pain shortness of breath or abdominal pain.  She has not hematemesis since she was hospitalized.  She did have nonbloody emesis yesterday.  Current Medications:  Current Facility-Administered Medications:  .  [MAR Hold] 0.9 %  sodium chloride infusion, 100 mL, Intravenous, PRN, Corliss Parish, MD .  Doug Sou Hold] 0.9 %  sodium chloride infusion, 100 mL, Intravenous, PRN, Corliss Parish, MD .  0.9 %  sodium chloride infusion, , Intravenous, Continuous, Annitta Needs, NP .  Doug Sou Hold] acetaminophen (TYLENOL) tablet 650 mg, 650 mg, Oral, Q6H PRN **OR** [MAR Hold] acetaminophen (TYLENOL) suppository 650 mg, 650 mg, Rectal, Q6H PRN, Johnson, Clanford L, MD .  Doug Sou Hold] acyclovir (ZOVIRAX) 200 MG capsule 200 mg, 200 mg, Oral, Daily, Johnson, Clanford L, MD, 200 mg at 07/23/20 1000 .  [MAR Hold] alteplase (CATHFLO ACTIVASE) injection 2 mg, 2 mg, Intracatheter, Once PRN, Corliss Parish, MD .  Doug Sou Hold] amLODipine (NORVASC) tablet 5 mg, 5 mg, Oral, Daily, Corliss Parish, MD, 5 mg at 07/23/20 1000 .  [MAR Hold] anastrozole (ARIMIDEX) tablet 1 mg, 1 mg, Oral, Daily, Johnson, Clanford L, MD, 1 mg at 07/23/20 0959 .  [MAR Hold] atorvastatin (LIPITOR) tablet 10 mg, 10 mg, Oral, QHS, Johnson, Clanford L, MD .  Doug Sou Hold] calcium-vitamin D (OSCAL WITH D) 500-200 MG-UNIT per tablet 1 tablet, 1 tablet, Oral, Daily, Johnson, Clanford L, MD, 1 tablet at 07/23/20 0959 .  [MAR Hold] Chlorhexidine Gluconate Cloth 2 % PADS 6 each, 6 each, Topical, Q0600, Corliss Parish, MD, 6 each at 07/23/20 0446 .  [MAR Hold] Chlorhexidine Gluconate Cloth 2 % PADS 6 each, 6 each, Topical, Q0600, Corliss Parish, MD, 6 each at 07/23/20 1002 .  [MAR Hold] cloNIDine (CATAPRES) tablet 0.2 mg, 0.2 mg, Oral, BID, Corliss Parish, MD, 0.2 mg at 07/23/20 1000 .  [MAR Hold] Darbepoetin Alfa (ARANESP) injection 150 mcg, 150 mcg, Intravenous, Q  Mon-HD, Corliss Parish, MD, 150 mcg at 07/22/20 2207 .  [MAR Hold] doxercalciferol (HECTOROL) injection 2 mcg, 2 mcg, Intravenous, Q M,W,F-HD, Corliss Parish, MD, 2 mcg at 07/22/20 2207 .  [MAR Hold] doxycycline (VIBRA-TABS) tablet 100 mg, 100 mg, Oral, Q12H, Johnson, Clanford L, MD, 100 mg at 07/23/20 1000 .  [MAR Hold] heparin injection 3,800 Units, 3,800 Units, Dialysis, PRN, Corliss Parish, MD .  Doug Sou Hold] insulin aspart (novoLOG) injection 0-6 Units, 0-6 Units, Subcutaneous, TID WC, Johnson, Clanford L, MD, 1 Units at 07/23/20 1133 .  [MAR Hold] insulin aspart (novoLOG) injection 8 Units, 8 Units, Subcutaneous, TID WC, Johnson, Clanford L, MD .  Doug Sou Hold] insulin glargine (LANTUS) injection 10 Units, 10 Units, Subcutaneous, QHS, Johnson, Clanford L, MD .  Doug Sou Hold] melatonin tablet 6 mg, 6 mg, Oral, QHS, Johnson, Clanford L, MD .  Doug Sou Hold] multivitamin with minerals tablet 1 tablet, 1 tablet, Oral, Q lunch, [name], Terri L, RPH .  [MAR Hold] ondansetron (ZOFRAN) tablet 4 mg, 4 mg, Oral, Q6H PRN **OR** [MAR Hold] ondansetron (ZOFRAN) injection 4 mg, 4 mg, Intravenous, Q6H PRN, Johnson, Clanford L, MD, 4 mg at 07/23/20 0450 .  pantoprazole (PROTONIX) 80 mg in sodium chloride 0.9 % 100 mL (0.8 mg/mL) infusion, 8 mg/hr, Intravenous, Continuous, Johnson, Clanford L, MD, Last Rate: 10 mL/hr at 07/23/20 1218, 8 mg/hr at 07/23/20 1218 .  [MAR Hold] sertraline (ZOLOFT) tablet 50 mg, 50 mg, Oral, Daily, Johnson, Clanford L, MD, 50 mg at 07/23/20 1000   Objective: Blood pressure (!) 104/47,  pulse 99, temperature 99 F (37.2 C), resp. rate (!) 22, height _0  (1.6 m), weight 64.5 kg, SpO2 100 %. Patient is alert and responds appropriate to questions. Conjunctivae is pale.  Sclera is nonicteric. Oropharyngeal mucosa is normal.  Dentition in satisfactory condition. Cardiac exam with regular rhythm normal S1 and S2.  Faint systolic murmur noted at aortic area. Auscultation lungs  reveal vesicular breath sounds bilaterally. Abdomen is soft and nontender. She has edema to left hand, forearm and arm. She also has trace edema around ankles.  Labs/studies Results:  CBC Latest Ref Rng & Units 07/23/2020 07/22/2020 07/17/2020  WBC 4.0 - 10.5 K/uL 21.4(H) 11.6(H) 11.5(H)  Hemoglobin 12.0 - 15.0 g/dL 7.8(L) 7.1(L) 9.4(L)  Hematocrit 36.0 - 46.0 % 24.8(L) 23.5(L) 29.8(L)  Platelets 150 - 400 K/uL 390 444(H) 300    CMP Latest Ref Rng & Units 07/23/2020 07/22/2020 07/17/2020  Glucose 70 - 99 mg/dL 162(H) 214(H) 140(H)  BUN 8 - 23 mg/dL 38(H) 97(H) 68(H)  Creatinine 0.44 - 1.00 mg/dL 4.76(H) 9.18(H) 8.04(H)  Sodium 135 - 145 mmol/L 136 140 135  Potassium 3.5 - 5.1 mmol/L 3.7 4.5 4.2  Chloride 98 - 111 mmol/L 93(L) 96(L) 95(L)  CO2 22 - 32 mmol/L _1 Calcium 8.9 - 10.3 mg/dL 8.4(L) 8.0(L) 8.5(L)  Total Protein 6.5 - 8.1 g/dL - - -  Total Bilirubin 0.3 - 1.2 mg/dL - - -  Alkaline Phos 38 - 126 U/L - - -  AST 15 - 41 U/L - - -  ALT 0 - 44 U/L - - -    Hepatic Function Latest Ref Rng & Units 07/11/2020 07/02/2020 06/18/2020  Total Protein 6.5 - 8.1 g/dL 6.8 6.6 6.4(L)  Albumin 3.5 - 5.0 g/dL 3.5 3.7 3.6  AST 15 - 41 U/L 33 18 23  ALT 0 - 44 U/L _2 Alk Phosphatase 38 - 126 U/L 68 81 73  Total Bilirubin 0.3 - 1.2 mg/dL 0.7 0.4 0.5      Assessment:  #1.  Upper GI bleed.  She has history of peptic ulcer disease.  She was found to have gastric ulcers in June 2018 and subsequently documented to have healed ulcers in September 2018.  She is on low-dose aspirin.  Suspect recurrent peptic ulcer disease.  She has multiple risk.  #2.  End-stage renal disease.  Patient is on dialysis.  He was dialyzed yesterday.  #3.  Anemia.  Acute on chronic anemia secondary to upper GI bleed.  #4.  Diabetes mellitus.   Plan:  Proceed with diagnostic and possibly therapeutic esophagogastroduodenoscopy. Procedure reviewed with the patient and she is agreeable.

## 2020-07-23 NOTE — Progress Notes (Signed)
PT Cancellation Note  Patient Details Name: Carly Wood MRN: [id number] DOB: June 29, 1935   Cancelled Treatment:    Reason Eval/Treat Not Completed: Medical issues which prohibited therapy. Pt reports feeling poorly, BP 108/43mmHg while supine. Will hold PT eval for when BP in therapeutic range, RN notified.    Tori Cotton Beckley PT, DPT 07/23/20, 12:12 PM

## 2020-07-23 NOTE — Anesthesia Procedure Notes (Signed)
Performed by: Tacy Learn, CRNA Pre-anesthesia Checklist: Timeout performed, Patient being monitored, Suction available, Emergency Drugs available and Patient identified Patient Re-evaluated:Patient Re-evaluated prior to induction Oxygen Delivery Method: Nasal cannula Induction Type: IV induction

## 2020-07-23 NOTE — Transfer of Care (Signed)
Immediate Anesthesia Transfer of Care Note  Patient: Carly Wood  Procedure(s) Performed: ESOPHAGOGASTRODUODENOSCOPY (EGD) WITH PROPOFOL (N/A ) HOT HEMOSTASIS (ARGON PLASMA COAGULATION/BICAP) SCLEROTHERAPY  Patient Location: Short Stay  Anesthesia Type:General  Level of Consciousness: awake, alert , drowsy and patient cooperative  Airway & Oxygen Therapy: Patient Spontanous Breathing  Post-op Assessment: Report given to RN, Post -op Vital signs reviewed and stable and Patient moving all extremities  Post vital signs: Reviewed and stable  Last Vitals:  Vitals Value Taken Time  BP    Temp    Pulse    Resp    SpO2      Last Pain:  Vitals:   07/23/20 1309  TempSrc:   PainSc: 0-No pain      Patients Stated Pain Goal: 2 (09/73/53 2992)  Complications: No complications documented.

## 2020-07-23 NOTE — Progress Notes (Signed)
PROGRESS NOTE   Carly Wood  TDS:[id number] DOB: [birth year] DOA: 07/22/2020 PCP: Gerlene Fee, NP   Chief Complaint  Patient presents with  . Rectal Bleeding   Level of care: Stepdown  Brief Admission History:  85 y.o. female with medical history significant for recent hip fracture repair discharged to [facility] on aspirin 81 mg twice daily for DVT prophylaxis.  Patient has end-stage renal disease hemodialysis Monday Wednesday Friday.  Patient has type 2 diabetes mellitus, depression, dementia, hypertension, glaucoma, breast cancer.  She was sent to the emergency department from EMS from [facility] with symptoms of hematemesis, bright red blood in stool and black stools.  Assessment & Plan:   Principal Problem:   Acute upper GI bleed Active Problems:   Aromatase inhibitor use   Lymphedema of arm   Dysphagia   GERD (gastroesophageal reflux disease)   Multiple gastric ulcers   Iron deficiency anemia   Multiple myeloma (HCC)   Diabetes mellitus with ESRD (end-stage renal disease) ([place])   Hypertension due to end stage renal disease caused by type 2 diabetes mellitus, on dialysis ([place])   Anemia due to end stage renal disease (HCC)   Herpes   Vascular dementia with behavior disturbance (HCC)   Dependence on renal dialysis ([place])   Aortic atherosclerosis (HCC)   Atherosclerotic peripheral vascular disease (HCC)   Hyperlipidemia associated with type 2 diabetes mellitus ([place])   Closed right hip fracture (HCC)   ESRD (end stage renal disease) ([place])   Major depression, recurrent, chronic (HCC)   Rectal bleeding   Acute blood loss anemia   1. Acute GI bleeding -suspected upper GI bleeding source however with bright red rectal bleeding and clots that we have seen could also be having lower GI bleeding source as well.  Agree with holding aspirin.  Continue IV Protonix infusion.  Admit to stepdown ICU and continue supportive therapies.  Transfused 1 PRBC and Hg up to 7.8.     2. Acute blood loss anemia-patient transfused 1 packed red blood cells during HD.   Follow CBC.  Transfuse as needed. 3. End-stage renal disease on hemodialysis-patient received hemodialysis 5/16 and nephrology has been consulted.   4. Status post right hip fracture and repair- should have staples removed in about 4 days on 07/27/2020 (2 weeks postop) 5. 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 6. Essential hypertension -patient has had some soft blood pressures and being resuscitated with IV fluid bolus and PRBC transfusion.  Resume home blood pressure medications at lower doses for soft BP.  7. History of multiple gastric ulcers- patient likely had a poor response to twice daily aspirin and could have recurrence of gastric ulcers leading.  DC aspirin as above.  Continue IV Protonix infusion and GI consultation planning for endoscopy. 8. History of breast cancer- patient has been maintained on aromatase inhibitor therapy. 9. Vascular dementia - delirium precautions.    DVT prophylaxis: SCD Code Status: Full   Family Communication:    Disposition Plan: return to SNF when medically stabilized   Consults called: GI, Nephrology   Admission status: INP   Status is: Inpatient  Remains inpatient appropriate because:IV treatments appropriate due to intensity of illness or inability to take PO and Inpatient level of care appropriate due to severity of illness   Dispo: The patient is from: SNF  Anticipated d/c is to: SNF              Patient currently is not medically stable to d/c.   Difficult to place patient No   Consultants:   GI   Procedures:   EGD planned for 5/17   Antimicrobials:    Subjective: Pt reports pain in feet (allodynia)  Objective: Vitals:   07/23/20 0736 07/23/20 0800 07/23/20 0900 07/23/20 1000  BP:  (!) 128/41  (!) 135/41 (!) 132/41  Pulse:   100   Resp: 10 13 (!) 25   Temp: 98.1 F (36.7 C)     TempSrc: Oral     SpO2:   98%   Weight:      Height:        Intake/Output Summary (Last 24 hours) at 07/23/2020 1104 Last data filed at 07/23/2020 0600 Gross per 24 hour  Intake 627.14 ml  Output 992 ml  Net -364.86 ml   Filed Weights   07/22/20 1234 07/22/20 2000 07/23/20 0500  Weight: 64.5 kg 64.5 kg 64.5 kg    Examination:  General exam: chronically ill appearing female, awake, alert, Appears calm and comfortable  Respiratory system: no increased work of breathing. Respiratory effort normal. Cardiovascular system: normal S1 & S2 heard. No JVD, murmurs, rubs, gallops or clicks. No pedal edema. Gastrointestinal system: Abdomen is nondistended, soft and nontender. No organomegaly or masses felt. Normal bowel sounds heard. Central nervous system: Alert and oriented but confused. No focal neurological deficits. Extremities: trace pretibial edema BLEs.  Symmetric 5 x 5 power. Skin: No rashes, lesions or ulcers Psychiatry: Judgement and insight appear poor. Mood & affect irritable.   Data Reviewed: I have personally reviewed following labs and imaging studies  CBC: Recent Labs  Lab 07/17/20 0540 07/22/20 0841 07/23/20 0525  WBC 11.5* 11.6* 21.4*  NEUTROABS  --  9.2*  --   HGB 9.4* 7.1* 7.8*  HCT 29.8* 23.5* 24.8*  MCV 100.3* 104.0* 99.2  PLT 300 444* 774    Basic Metabolic Panel: Recent Labs  Lab 07/17/20 0540 07/22/20 0841 07/23/20 0525  NA 135 140 136  K 4.2 4.5 3.7  CL 95* 96* 93*  CO2 _0 GLUCOSE 140* 214* 162*  BUN 68* 97* 38*  CREATININE 8.04* 9.18* 4.76*  CALCIUM 8.5* 8.0* 8.4*  MG  --   --  2.0  PHOS  --   --  2.8    GFR: Estimated Creatinine Clearance: 8.2 mL/min (A) (by C-G formula based on SCr of 4.76 mg/dL (H)).  Liver Function Tests: No results for input(s): AST, ALT, ALKPHOS, BILITOT, PROT, ALBUMIN in the last 168 hours.  CBG: Recent Labs  Lab  07/22/20 1239 07/22/20 1606 07/22/20 2137 07/23/20 0300 07/23/20 0738  GLUCAP 175* 148* 137* 131* 151*    Recent Results (from the past 240 hour(s))  Resp Panel by RT-PCR (Flu A&B, Covid) Nasopharyngeal Swab     Status: None   Collection Time: 07/22/20  8:22 AM   Specimen: Nasopharyngeal Swab; Nasopharyngeal(NP) swabs in vial transport medium  Result Value Ref Range Status   SARS Coronavirus 2 by RT PCR NEGATIVE NEGATIVE Final    Comment: (NOTE) SARS-CoV-2 target nucleic acids are NOT DETECTED.  The SARS-CoV-2 RNA is generally detectable in upper respiratory specimens during the acute phase of infection. The lowest concentration of SARS-CoV-2 viral copies this assay can detect is 138 copies/mL. A negative result does not preclude SARS-Cov-2 infection and should not be used as the  sole basis for treatment or other patient management decisions. A negative result may occur with  improper specimen collection/handling, submission of specimen other than nasopharyngeal swab, presence of viral mutation(s) within the areas targeted by this assay, and inadequate number of viral copies(<138 copies/mL). A negative result must be combined with clinical observations, patient history, and epidemiological information. The expected result is Negative.  Fact Sheet for Patients:  EntrepreneurPulse.com.au  Fact Sheet for Healthcare Providers:  IncredibleEmployment.be  This test is no t yet approved or cleared by the [place] FDA and  has been authorized for detection and/or diagnosis of SARS-CoV-2 by FDA under an Emergency Use Authorization (EUA). This EUA will remain  in effect (meaning this test can be used) for the duration of the COVID-19 declaration under Section 564(b)(1) of the Act, 21 U.S.C.section 360bbb-3(b)(1), unless the authorization is terminated  or revoked sooner.       Influenza A by PCR NEGATIVE NEGATIVE Final   Influenza B by PCR  NEGATIVE NEGATIVE Final    Comment: (NOTE) The Xpert Xpress SARS-CoV-2/FLU/RSV plus assay is intended as an aid in the diagnosis of influenza from Nasopharyngeal swab specimens and should not be used as a sole basis for treatment. Nasal washings and aspirates are unacceptable for Xpert Xpress SARS-CoV-2/FLU/RSV testing.  Fact Sheet for Patients: EntrepreneurPulse.com.au  Fact Sheet for Healthcare Providers: IncredibleEmployment.be  This test is not yet approved or cleared by the [place] FDA and has been authorized for detection and/or diagnosis of SARS-CoV-2 by FDA under an Emergency Use Authorization (EUA). This EUA will remain in effect (meaning this test can be used) for the duration of the COVID-19 declaration under Section 564(b)(1) of the Act, 21 U.S.C. section 360bbb-3(b)(1), unless the authorization is terminated or revoked.  Performed at [facility], [address] [place], [place] [address]   MRSA PCR Screening     Status: None   Collection Time: 07/22/20 11:55 AM   Specimen: Nasopharyngeal  Result Value Ref Range Status   MRSA by PCR NEGATIVE NEGATIVE Final    Comment:        The GeneXpert MRSA Assay (FDA approved for NASAL specimens only), is one component of a comprehensive MRSA colonization surveillance program. It is not intended to diagnose MRSA infection nor to guide or monitor treatment for MRSA infections. Performed at [facility], [address], [place], [place] [address]      Radiology Studies: DG CHEST PORT 1 VIEW  Result Date: 07/23/2020 CLINICAL DATA:  Dyspnea, leukocytosis. EXAM: PORTABLE CHEST 1 VIEW COMPARISON:  Jul 14, 2020. FINDINGS: The heart size and mediastinal contours are within normal limits. Both lungs are clear. Stable position of left internal jugular dialysis catheter. No pneumothorax or pleural effusion is noted. The visualized skeletal structures are unremarkable. IMPRESSION: No active  disease. Aortic Atherosclerosis (ICD10-I70.0). Electronically Signed   By: Marijo Conception M.D.   On: 07/23/2020 09:25   Scheduled Meds: . acyclovir  200 mg Oral Daily  . amLODipine  5 mg Oral Daily  . anastrozole  1 mg Oral Daily  . atorvastatin  10 mg Oral QHS  . calcium-vitamin D  1 tablet Oral Daily  . Chlorhexidine Gluconate Cloth  6 each Topical Q0600  . Chlorhexidine Gluconate Cloth  6 each Topical Q0600  . cloNIDine  0.2 mg Oral BID  . darbepoetin (ARANESP) injection - DIALYSIS  150 mcg Intravenous Q Mon-HD  . doxercalciferol  2 mcg Intravenous Q M,W,F-HD  . doxycycline  100 mg Oral Q12H  .  insulin aspart  0-6 Units Subcutaneous TID WC  . insulin aspart  8 Units Subcutaneous TID WC  . insulin glargine  10 Units Subcutaneous QHS  . melatonin  6 mg Oral QHS  . multivitamin with minerals  1 tablet Oral Q lunch  . sertraline  50 mg Oral Daily   Continuous Infusions: . sodium chloride    . sodium chloride    . pantoprozole (PROTONIX) infusion 8 mg/hr (07/23/20 0600)     LOS: 1 day   Time spent: 40 mins   Fatimah Sundquist Wynetta Emery, MD How to contact the [facility] Attending or Consulting provider [place] or covering provider during after hours [place], for this patient?  1. Check the care team in [facility] and look for a) attending/consulting [facility] provider listed and b) the [facility] team listed 2. Log into [email or web address] and use [place]'s universal password to access. If you do not have the password, please contact the hospital operator. 3. Locate the [facility] provider you are looking for under [facility] and page to a number that you can be directly reached. 4. If you still have difficulty reaching the provider, please page the [facility] (Director on Call) for the Hospitalists listed on amion for assistance.  07/23/2020, 11:04 AM

## 2020-07-23 NOTE — Progress Notes (Addendum)
Brief EGD note.  Normal mucosa of the esophagus. Irregular GE junction. 3 cm sliding hiatal hernia. Stomach with small amount of food debris but no fresh or old blood noted. 2 small antral prepyloric ulcers with clean base. Antral gastritis with redundant folds and J-shaped stomach leading to difficulty in advancing soap in the duodenum. 2 bulbar ulcers.  Large ulcer along the medial wall measuring 8 x 12 mm with pigmented material. Small bulbar ulcer without stigmata of bleed.   Coagulation attempted and gold probe resulting in active bleeding/oozing.  It was difficult to target this area because of constant peristalsis. For mL of dilute epi injected with hemostasis.  Patient tolerated procedure well.

## 2020-07-23 NOTE — Op Note (Signed)
[facility] Patient Name: Carly Wood Procedure Date: 07/23/2020 1:03 PM MRN: [id number] Date of Birth: 1935-11-28 Attending MD: Hildred Laser , MD CSN: [id number] Age: 85 Admit Type: Inpatient Procedure:                Upper GI endoscopy Indications:              Hematemesis Providers:                Hildred Laser, MD, Rosina Lowenstein, RN Referring MD:             Irwin Brakeman, MD Medicines:                Propofol per Anesthesia Complications:            No immediate complications. Estimated Blood Loss:     Estimated blood loss was minimal. Procedure:                Pre-Anesthesia Assessment:                           - Prior to the procedure, a History and Physical                            was performed, and patient medications and                            allergies were reviewed. The patient's tolerance of                            previous anesthesia was also reviewed. The risks                            and benefits of the procedure and the sedation                            options and risks were discussed with the patient.                            All questions were answered, and informed consent                            was obtained. Prior Anticoagulants: The patient has                            taken no previous anticoagulant or antiplatelet                            agents except for aspirin. ASA Grade Assessment: IV                            - A patient with severe systemic disease that is a                            constant threat to life. After reviewing the risks  and benefits, the patient was deemed in                            satisfactory condition to undergo the procedure.                           After obtaining informed consent, the endoscope was                            passed under direct vision. Throughout the                            procedure, the patient's blood pressure, pulse, and                             oxygen saturations were monitored continuously. The                            [id number]-[id number] ([id number]) was introduced through the                            mouth, and advanced to the second part of duodenum.                            The upper GI endoscopy was technically difficult                            and complex due to a J-shaped stomach which made                            pyloric intubation difficult. The patient tolerated                            the procedure well. Scope In: 1:30:47 PM Scope Out: 1:57:27 PM Total Procedure Duration: 0 hours 26 minutes 40 seconds  Findings:      The hypopharynx was normal.      The examined esophagus was normal.      The Z-line was irregular and was found 34 cm from the incisors.      Two non-bleeding cratered gastric ulcers with no stigmata of bleeding       were found in the prepyloric region of the stomach. The largest lesion       was 3 mm in largest dimension.      The exam of the stomach was otherwise normal.      One non-bleeding cratered duodenal ulcer with pigmented material was       found in the duodenal bulb. The lesion was eight mm by twelve mm in       largest dimension. Coagulation for bleeding prevention using gold probe.       was unsuccessful. Area was successfully injected with 4 mL of a 1:10,000       solution of epinephrine for hemostasis.      One non-bleeding cratered duodenal ulcer with no stigmata of bleeding       was found in the duodenal bulb. The lesion was 3 mm in  largest dimension.      The second portion of the duodenum was normal. Impression:               - Normal hypopharynx.                           - Normal esophagus.                           - Z-line irregular, 34 cm from the incisors.                           - Non-bleeding gastric ulcers with no stigmata of                            bleeding.                           - Duodenal ulcer with stigmata of bleed/ pigmented                             material. Gold probe therapy was not successful and                            hemostasis achieved with injection of dilute                            epinephrine.                           - Non-bleeding duodenal ulcer with no stigmata of                            bleeding(second bulbar ulcer)                           - Normal second portion of the duodenum.                           - No specimens collected. Moderate Sedation:      Per Anesthesia Care Recommendation:           - Return patient to hospital ward for ongoing care.                           - Clear liquid diet today.                           - Continue present medications.                           - Use sucralfate suspension 1 gram PO QID. Procedure Code(s):        --- Professional ---                           [phone], Esophagogastroduodenoscopy, flexible,  transoral; with control of bleeding, any method Diagnosis Code(s):        --- Professional ---                           K22.8, Other specified diseases of esophagus                           K25.9, Gastric ulcer, unspecified as acute or                            chronic, without hemorrhage or perforation                           K26.9, Duodenal ulcer, unspecified as acute or                            chronic, without hemorrhage or perforation                           K92.0, Hematemesis CPT copyright 2019 American Medical Association. All rights reserved. The codes documented in this report are preliminary and upon coder review may  be revised to meet current compliance requirements. Hildred Laser, MD Hildred Laser, MD 07/23/2020 2:21:28 PM This report has been signed electronically. Number of Addenda: 0

## 2020-07-23 NOTE — Progress Notes (Signed)
Per nurse, patient had 1 BM last night with dark red blood. Single episode of non-bloody emesis last night. No overt GI bleeding today. Hg 7.8 this morning, up from 7.1 s/p 1 unit PRBCs yesterday. Electrolytes ok. Hemodynamically stable this morning. Will proceed with EGD as planned today.   Aliene Altes, PA-C [facility]

## 2020-07-23 NOTE — TOC Initial Note (Signed)
Transition of Care [facility]) - Initial/Assessment Note    Patient Details  Name: Carly Wood MRN: [id number] Date of Birth: May 28, 1935  Transition of Care [facility]) CM/SW Contact:    Shade Flood, LCSW Phone Number: 07/23/2020, 11:01 AM  Clinical Narrative:                  Pt admitted from [facility]. Pt resides there long term but she has been in rehab since her dc from [facility] earlier this month. Spoke with Marianna Fuss at [facility] to update. Pt will be able to return to [facility] at dc. Pt will need new PT eval and insurance auth. Updated MD.  Anticipating dc later this week.  TOC will follow and assist with dc planning.  Expected Discharge Plan: Skilled Nursing Facility Barriers to Discharge: Continued Medical Work up   Patient Goals and CMS Choice Patient states their goals for this hospitalization and ongoing recovery are:: return to rehab      Expected Discharge Plan and Services Expected Discharge Plan: [place] In-house Referral: Clinical Social Work   Post Acute Care Choice: Resumption of Svcs/PTA Provider Living arrangements for the past 2 months: [place]                                      Prior Living Arrangements/Services Living arrangements for the past 2 months: [place] Lives with:: Facility Resident Patient language and need for interpreter reviewed:: Yes Do you feel safe going back to the place where you live?: Yes      Need for Family Participation in Patient Care: No (Comment) Care giver support system in place?: Yes (comment)   Criminal Activity/Legal Involvement Pertinent to Current Situation/Hospitalization: No - Comment as needed  Activities of Daily Living Home Assistive Devices/Equipment: Environmental consultant (specify type) ADL Screening (condition at time of admission) Patient's cognitive ability adequate to safely complete daily activities?: Yes Is the patient deaf or have difficulty hearing?: No Does the  patient have difficulty seeing, even when wearing glasses/contacts?: No Does the patient have difficulty concentrating, remembering, or making decisions?: Yes Patient able to express need for assistance with ADLs?: Yes Does the patient have difficulty dressing or bathing?: Yes Independently performs ADLs?: No Communication: Independent Dressing (OT): Needs assistance Is this a change from baseline?: Pre-admission baseline Grooming: Needs assistance Is this a change from baseline?: Pre-admission baseline Feeding: Needs assistance Is this a change from baseline?: Pre-admission baseline Bathing: Needs assistance Is this a change from baseline?: Pre-admission baseline Toileting: Needs assistance Is this a change from baseline?: Pre-admission baseline In/Out Bed: Needs assistance Is this a change from baseline?: Pre-admission baseline Walks in Home: Needs assistance Is this a change from baseline?: Pre-admission baseline Does the patient have difficulty walking or climbing stairs?: Yes Weakness of Legs: None Weakness of Arms/Hands: None  Permission Sought/Granted                  Emotional Assessment       Orientation: : Oriented to Place,Oriented to Self,Oriented to  Time,Oriented to Situation Alcohol / Substance Use: Not Applicable Psych Involvement: No (comment)  Admission diagnosis:  Rectal bleeding [K62.5] Acute upper GI bleed [K92.2] Severe anemia [D64.9] Patient Active Problem List   Diagnosis Date Noted  . Acute upper GI bleed 07/22/2020  . Rectal bleeding 07/22/2020  . Acute blood loss anemia 07/22/2020  . Major depression, recurrent, chronic ([place])  07/18/2020  . ESRD (end stage renal disease) ([place]) 07/13/2020  . Closed right hip fracture ([place]) 07/11/2020  . Hyperlipidemia associated with type 2 diabetes mellitus ([place]) 06/19/2020  . Aortic atherosclerosis ([place]) 04/01/2020  . Atherosclerotic peripheral vascular disease ([place]) 04/01/2020  . Acute pain of left  knee 01/12/2020  . Hyperkalemia 12/25/2019  . Dependence on renal dialysis ([place]) 10/25/2019  . Chronic kidney disease with end stage renal disease on dialysis due to type 2 diabetes mellitus ([place]) 10/19/2019  . Hypertension due to end stage renal disease caused by type 2 diabetes mellitus, on dialysis ([place]) 10/19/2019  . Anemia due to end stage renal disease ([place]) 10/19/2019  . Herpes 10/19/2019  . Vascular dementia with behavior disturbance ([place]) 10/19/2019  . Diabetes mellitus with ESRD (end-stage renal disease) ([place]) 10/12/2019  . Multiple myeloma ([place]) 10/04/2018  . Medicare annual wellness visit, subsequent 10/04/2018  . MGUS (monoclonal gammopathy of unknown significance) 09/20/2018  . Iron deficiency anemia 05/03/2017  . Multiple gastric ulcers 11/10/2016  . Dysphagia 07/31/2016  . GERD (gastroesophageal reflux disease) 07/31/2016  . Abnormal weight loss 07/31/2016  . Aromatase inhibitor use 08/22/2015  . Lymphedema of arm 08/22/2015  . Status post right mastectomy 08/22/2015  . Osteopenia determined by x-ray 08/22/2015  . Stage 1 infiltrating ductal carcinoma of right female breast ([place]) 08/21/2015   PCP:  Gerlene Fee, NP Pharmacy:   [facility], [address] [place] [address] Phone: [phone] Fax: [phone]     Social Determinants of Health (SDOH) Interventions    Readmission Risk Interventions Readmission Risk Prevention Plan 07/23/2020 07/14/2020  Transportation Screening Complete Complete  Medication Review Press photographer) Complete Complete  PCP or Specialist appointment within 3-5 days of discharge - Complete  HRI or Home Care Consult Complete Complete  SW Recovery Care/Counseling Consult - Complete  Palliative Care Screening Not Applicable Not Applicable  Skilled Nursing Facility Complete Complete  Some recent data might be hidden

## 2020-07-23 NOTE — Anesthesia Preprocedure Evaluation (Signed)
Anesthesia Evaluation  Patient identified by MRN, date of birth, ID band Patient awake    Reviewed: Allergy & Precautions, H&P , NPO status , Patient's Chart, lab work & pertinent test results, reviewed documented beta blocker date and time   History of Anesthesia Complications (+) history of anesthetic complications  Airway Mallampati: II  TM Distance: >3 FB Neck ROM: full    Dental no notable dental hx.    Pulmonary shortness of breath,    Pulmonary exam normal breath sounds clear to auscultation       Cardiovascular Exercise Tolerance: Good hypertension, negative cardio ROS   Rhythm:regular Rate:Normal     Neuro/Psych PSYCHIATRIC DISORDERS Anxiety Depression Dementia negative neurological ROS     GI/Hepatic Neg liver ROS, PUD, GERD  Medicated,  Endo/Other  negative endocrine ROSdiabetes  Renal/GU ESRF and DialysisRenal disease  negative genitourinary   Musculoskeletal   Abdominal   Peds  Hematology  (+) Blood dyscrasia, anemia ,   Anesthesia Other Findings   Reproductive/Obstetrics negative OB ROS                             Anesthesia Physical Anesthesia Plan  ASA: IV  Anesthesia Plan: General   Post-op Pain Management:    Induction:   PONV Risk Score and Plan: Propofol infusion  Airway Management Planned:   Additional Equipment:   Intra-op Plan:   Post-operative Plan:   Informed Consent: I have reviewed the patients History and Physical, chart, labs and discussed the procedure including the risks, benefits and alternatives for the proposed anesthesia with the patient or authorized representative who has indicated his/her understanding and acceptance.     Dental Advisory Given  Plan Discussed with: CRNA  Anesthesia Plan Comments:         Anesthesia Quick Evaluation

## 2020-07-23 NOTE — Anesthesia Postprocedure Evaluation (Signed)
Anesthesia Post Note  Patient: Carly Wood  Procedure(s) Performed: ESOPHAGOGASTRODUODENOSCOPY (EGD) WITH PROPOFOL (N/A ) HOT HEMOSTASIS (ARGON PLASMA COAGULATION/BICAP) SCLEROTHERAPY  Patient location during evaluation: Phase II Anesthesia Type: General Level of consciousness: awake Pain management: pain level controlled Vital Signs Assessment: post-procedure vital signs reviewed and stable Respiratory status: spontaneous breathing and respiratory function stable Cardiovascular status: blood pressure returned to baseline and stable Postop Assessment: no headache and no apparent nausea or vomiting Anesthetic complications: no Comments: Late entry   No complications documented.   Last Vitals:  Vitals:   07/23/20 1242 07/23/20 1400  BP: (!) 104/47 92/64  Pulse:  90  Resp: (!) 22 18  Temp: 37.2 C   SpO2: 100% 96%    Last Pain:  Vitals:   07/23/20 1400  TempSrc:   PainSc: 0-No pain                 Louann Sjogren

## 2020-07-24 ENCOUNTER — Encounter (HOSPITAL_COMMUNITY): Payer: Self-pay | Admitting: Internal Medicine

## 2020-07-24 DIAGNOSIS — K269 Duodenal ulcer, unspecified as acute or chronic, without hemorrhage or perforation: Secondary | ICD-10-CM

## 2020-07-24 DIAGNOSIS — K259 Gastric ulcer, unspecified as acute or chronic, without hemorrhage or perforation: Secondary | ICD-10-CM

## 2020-07-24 LAB — CBC
HCT: 21.2 % — ABNORMAL LOW (ref 36.0–46.0)
Hemoglobin: 6.7 g/dL — CL (ref 12.0–15.0)
MCH: 31.5 pg (ref 26.0–34.0)
MCHC: 31.6 g/dL (ref 30.0–36.0)
MCV: 99.5 fL (ref 80.0–100.0)
Platelets: 385 10*3/uL (ref 150–400)
RBC: 2.13 MIL/uL — ABNORMAL LOW (ref 3.87–5.11)
RDW: 18.2 % — ABNORMAL HIGH (ref 11.5–15.5)
WBC: 14.3 10*3/uL — ABNORMAL HIGH (ref 4.0–10.5)
nRBC: 0.2 % (ref 0.0–0.2)

## 2020-07-24 LAB — GLUCOSE, CAPILLARY
Glucose-Capillary: 92 mg/dL (ref 70–99)
Glucose-Capillary: 134 mg/dL — ABNORMAL HIGH (ref 70–99)
Glucose-Capillary: 81 mg/dL (ref 70–99)
Glucose-Capillary: 96 mg/dL (ref 70–99)
Glucose-Capillary: 83 mg/dL (ref 70–99)

## 2020-07-24 LAB — BASIC METABOLIC PANEL
Anion gap: 13 (ref 5–15)
BUN: 49 mg/dL — ABNORMAL HIGH (ref 8–23)
CO2: 27 mmol/L (ref 22–32)
Calcium: 8 mg/dL — ABNORMAL LOW (ref 8.9–10.3)
Chloride: 93 mmol/L — ABNORMAL LOW (ref 98–111)
Creatinine, Ser: 6.55 mg/dL — ABNORMAL HIGH (ref 0.44–1.00)
GFR, Estimated: 6 mL/min — ABNORMAL LOW (ref >60)
Glucose, Bld: 96 mg/dL (ref 70–99)
Potassium: 4.7 mmol/L (ref 3.5–5.1)
Sodium: 133 mmol/L — ABNORMAL LOW (ref 135–145)

## 2020-07-24 LAB — PREPARE RBC (CROSSMATCH)

## 2020-07-24 LAB — MAGNESIUM: Magnesium: 2 mg/dL (ref 1.7–2.4)

## 2020-07-24 LAB — PHOSPHORUS: Phosphorus: 3.8 mg/dL (ref 2.5–4.6)

## 2020-07-24 MED ORDER — SODIUM CHLORIDE 0.9% IV SOLUTION: Freq: Once | INTRAVENOUS | Status: AC

## 2020-07-24 NOTE — Progress Notes (Addendum)
Subjective:  Denies abdominal pain. Feels hungry. Per nursing passed a small amount of black stool. No n/v.  Objective: Vital signs in last 24 hours: Temp:  [97.9 F (36.6 C)-98.9 F (37.2 C)] 98.4 F (36.9 C) (05/18 1136) Pulse Rate:  [68-91] 84 (05/18 0907) Resp:  [4-52] 14 (05/18 1205) BP: (92-136)/(31-76) 133/50 (05/18 1100) SpO2:  [94 %-100 %] 100 % (05/18 0907) Weight:  [65.5 kg] 65.5 kg (05/18 0900) Last BM Date: 07/24/20 General:   Alert,  Well-developed, well-nourished, pleasant and cooperative in NAD Head:  Normocephalic and atraumatic. Eyes:  Sclera clear, no icterus.  Abdomen:  Soft, nontender and nondistended.  Normal bowel sounds, without guarding, and without rebound.   Extremities:  Without clubbing, deformity or edema. Neurologic:  Alert and  oriented x3;  grossly normal neurologically. Skin:  Intact without significant lesions or rashes. Psych:  Alert and cooperative. Normal mood and affect.  Intake/Output from previous day: 05/17 0701 - 05/18 0700 In: 1164 [P.O.:600; I.V.:564] Out: -  Intake/Output this shift: Total I/O In: 1010.5 [I.V.:49.5; Blood:961] Out: -   Lab Results: CBC Recent Labs    07/23/20 0525 07/23/20 1504 07/24/20 0457  WBC 21.4* 18.3* 14.3*  HGB 7.8* 7.5* 6.7*  HCT 24.8* 23.5* 21.2*  MCV 99.2 99.6 99.5  PLT 390 382 385   BMET Recent Labs    07/23/20 0525 07/23/20 1504 07/24/20 0457  NA 136 134* 133*  K 3.7 4.4 4.7  CL 93* 94* 93*  CO2 _0 GLUCOSE 162* 138* 96  BUN 38* 42* 49*  CREATININE 4.76* 5.67* 6.55*  CALCIUM 8.4* 8.1* 8.0*   LFTs Recent Labs    07/23/20 1504  ALBUMIN 2.9*   No results for input(s): LIPASE in the last 72 hours. PT/INR No results for input(s): LABPROT, INR in the last 72 hours.    Imaging Studies: DG Chest 1 View  Result Date: 07/11/2020 CLINICAL DATA:  Pain following fall EXAM: CHEST  1 VIEW COMPARISON:  November 22, 2017 FINDINGS: Central catheter tip is at the cavoatrial  junction. No pneumothorax. There is slight left base atelectasis. Lungs elsewhere clear. Heart is upper normal in size with pulmonary vascularity normal. There is aortic atherosclerosis. No adenopathy. No bone lesions. There are surgical clips in the right axilla. IMPRESSION: Central catheter tip at cavoatrial junction. No pneumothorax. Mild left base atelectasis. No edema or airspace opacity. Stable cardiac silhouette. Aortic Atherosclerosis (ICD10-I70.0). Electronically Signed   By: Lowella Grip III M.D.   On: 07/11/2020 15:14   DG Pelvis 1-2 Views  Result Date: 07/12/2020 CLINICAL DATA:  The patient suffered a right hip fracture in a fall 07/11/2020. Initial encounter. EXAM: PELVIS - 1-2 VIEW COMPARISON:  Plain films right hip 07/11/2020. FINDINGS: New intramedullary nail and hip screw are in place for fixation of a right hip fracture. Hardware is intact. The intramedullary nail is incompletely imaged. Position and alignment are near anatomic. No acute abnormality. IMPRESSION: Status post fixation right hip fracture.  No acute finding. Electronically Signed   By: Inge Rise M.D.   On: 07/12/2020 15:07   DG Knee 1-2 Views Right  Result Date: 07/11/2020 CLINICAL DATA:  Recent fall with right knee pain, initial encounter EXAM: RIGHT KNEE - 2 VIEW COMPARISON:  None. FINDINGS: No acute fracture or dislocation is noted. Diffuse vascular calcifications are seen. No soft tissue abnormality is noted. IMPRESSION: No acute abnormality noted. Electronically Signed   By: Inez Catalina M.D.   On: 07/11/2020 15:13  PERIPHERAL VASCULAR CATHETERIZATION  Result Date: 07/05/2020 Procedure: Right arm fistulogram, angioplasty right subclavian innominate vein superior vena cava Preoperative diagnosis: Venous hypertension right arm Postoperative diagnosis: Same Anesthesia: Local Operative findings: 1.  80% stenosis right subclavian and innominate and superior vena cava angioplastied up to a 10 mm balloon with  complete elastic recoil and no significant improvement Operative details: After obtaining informed consent, the patient was taken the PV lab.  The patient was placed in supine position the angio table.  Patient's right upper extremities prepped and draped in usual sterile fashion.  Local anesthesia was infiltrated over the right upper arm AV fistula.  Micropuncture needle was used to cannulate the right arm AV fistula under ultrasound guidance.  Micropuncture wire was advanced into the fistula.  Micropuncture sheath was placed over this.  Right arm fistulogram and central venogram was then performed through the sheath.  The superior vena cava innominate vein and subclavian veins on the right side are all patent.  However there were multiple segments areas of narrowing up to 80%.  There are abundant collaterals around the shoulder.  The remainder of the AV fistula is widely patent.  Compression on the outflow to reflux contrast across the arterial anastomosis showed this was widely patent as well. At this point the sheath was accessed with an 035 versa core wire and advanced across the central vein stenosis into the right atrium.  This was assisted with a KMP catheter.  A 7 French sheath was then placed over the wire.  Patient was given 3000 units of intravenous heparin.  I then angioplastied 3 discrete lesions in the right subclavian right innominate and superior vena cava utilizing multiple inflations of a 10 x 40 balloon.  Due to the recoil nature of the vein the balloon Sliding forward.  Therefore I swapped this out for a 1080 balloon.  This was then inflated to 8 atm for 1 minute.  Completion angiogram really showed no significant improvement and complete Recoil of the narrowing.  A similar procedure had been performed approximately 30 days ago.  In light of this I felt my options were to either stent her central vein potentially encroaching upon the left innominate system and jailing the right internal jugular  vein or abandoning this in favor of placing a thigh graft in the near future.  I opted not to stent the central vein segment. At this point the guidewire was removed the sheath was removed and the fistula opening closed with a single figure-of-eight Monocryl stitch. Operative management: The patient has had previous access procedures which failed in the left arm.  She now has venous hypertension and multiple areas of narrowing in the right innominate superior vena cava on the right side.  I do not believe we are going to obtain significant durability of her right arm fistula even if we stent this segment.  We will schedule the patient for outpatient follow-up for consideration of a thigh graft. Ruta Hinds, MD [facility] Office: [phone]   DG CHEST PORT 1 VIEW  Result Date: 07/23/2020 CLINICAL DATA:  Dyspnea, leukocytosis. EXAM: PORTABLE CHEST 1 VIEW COMPARISON:  Jul 14, 2020. FINDINGS: The heart size and mediastinal contours are within normal limits. Both lungs are clear. Stable position of left internal jugular dialysis catheter. No pneumothorax or pleural effusion is noted. The visualized skeletal structures are unremarkable. IMPRESSION: No active disease. Aortic Atherosclerosis (ICD10-I70.0). Electronically Signed   By: Marijo Conception M.D.   On: 07/23/2020  09:25   DG Chest Port 1 View  Result Date: 07/14/2020 CLINICAL DATA:  Short of breath EXAM: PORTABLE CHEST 1 VIEW COMPARISON:  Prior chest x-ray 07/11/2020 FINDINGS: Left IJ approach tunneled hemodialysis catheter. Catheter tip overlies the superior cavoatrial junction. Stable cardiac and mediastinal contours. Atherosclerotic calcifications again visualized in the transverse aorta. Increased pulmonary vascular congestion. Chronic elevation of the left hemidiaphragm with left basilar atelectasis. No pneumothorax. No acute osseous abnormality. IMPRESSION: 1. Increased pulmonary vascular congestion suggests either mild  CHF or fluid overload. 2. Stable and well-positioned left IJ tunneled hemodialysis catheter. Electronically Signed   By: Jacqulynn Cadet M.D.   On: 07/14/2020 11:08   DG HIP OPERATIVE UNILAT WITH PELVIS RIGHT  Result Date: 07/12/2020 CLINICAL DATA:  Hip fracture EXAM: OPERATIVE right HIP (WITH PELVIS IF PERFORMED) 14 VIEWS TECHNIQUE: Fluoroscopic spot image(s) were submitted for interpretation post-operatively. COMPARISON:  07/11/2020 FINDINGS: Fourteen low resolution intraoperative spot views of the right hip. Total fluoroscopy time was 1 minutes 50 seconds. The images demonstrate basicervical/intertrochanteric fracture with subsequent intramedullary rodding and distal screw fixation with anatomic alignment. IMPRESSION: Intraoperative fluoroscopic assistance provided during surgical fixation of proximal right femur fracture. Electronically Signed   By: Donavan Foil M.D.   On: 07/12/2020 16:02   DG Hip Unilat  With Pelvis 2-3 Views Right  Result Date: 07/11/2020 CLINICAL DATA:  Fall, right hip deformity EXAM: DG HIP (WITH OR WITHOUT PELVIS) 2-3V RIGHT COMPARISON:  None. FINDINGS: Single view radiograph pelvis and two view radiograph of the right hip demonstrates an acute, mildly impacted, angulated basicervical right femoral neck fracture with mild posterior and moderate varus angulation. The femoral head is still seated within the right acetabulum. Mild-to-moderate degenerative hip arthritis is present with joint space narrowing noted bilaterally. The pelvis and left hip are intact. Multiple involuted fibroids are seen within the pelvis. Advanced vascular calcifications are seen within the pelvis and medial thighs. IMPRESSION: Acute, impacted, angulated right basicervical femoral neck fracture. Electronically Signed   By: Fidela Salisbury MD   On: 07/11/2020 15:14   DG FEMUR PORT, MIN 2 VIEWS RIGHT  Result Date: 07/12/2020 CLINICAL DATA:  Postop femoral ORIF. EXAM: RIGHT FEMUR PORTABLE 2 VIEW COMPARISON:   Intraoperative views earlier the same date. Radiographs 07/11/2020. FINDINGS: Status post right femoral dynamic screw and intramedullary fixation of the comminuted intertrochanteric femur fracture. There is near anatomic reduction of the fracture. The hardware is well positioned. Lateral skin staples are present superior to the greater trochanter, and there is soft tissue emphysema laterally in the proximal to mid right thigh. No complications identified. Calcified uterine fibroids and femoral atherosclerosis are noted. IMPRESSION: Near anatomic reduction of intertrochanteric right femur fracture post ORIF. Electronically Signed   By: Richardean Sale M.D.   On: 07/12/2020 15:06  [2 weeks]   Assessment: 85 year old female resident of [facility], history of multiple myeloma and followed by Dr. Delton Coombes, end-stage renal disease on dialysis, dementia, presenting via EMS due to hematemesis and melena, found to have acute on chronic anemia with hemoglobin of 7.1, previously 9.4 several days ago at discharge after hospitalization for hip fracture.  She was discharged on 81 mg of aspirin twice daily.  She has a history of significant for peptic ulcer disease in June 2018, did complete surveillance EGD with documentation of healed ulcers.  At that time felt to be NSAID related.  EGD May 17 with 2 nonbleeding cratered gastric ulcers with no stigmata of bleeding in the prepyloric region of the stomach.  1 nonbleeding cratered duodenal ulcer with stigmata of bleeding/pigmented material found in the duodenal bulb.  Coagulation for bleeding prevention using gold probe unsuccessful, successfully injected with epinephrine for hemostasis.  An additional nonbleeding cratered duodenal ulcer seen.  Hemoglobin down to 6.7 today from 7.5 yesterday.  Plans for second unit of packed red blood cells. No obvious overt GI bleeding today, one stool passed earlier, black from previous bleeding.  Plan: 1. Continue PPI  infusion. 2. Continue sucralfate. 3. Monitor H/H, transfuse as needed.  4. Full liquids.  5. Reassess in AM.   Laureen Ochs. Bernarda Caffey [facility] [phone] 5/18/20222:58 PM     LOS: 2 days

## 2020-07-24 NOTE — Progress Notes (Signed)
PT Cancellation Note  Patient Details Name: Carly Wood MRN: [id number] DOB: 25-Oct-1935   Cancelled Treatment:    Reason Eval/Treat Not Completed: Patient at procedure or test/unavailable.  Patient receiving dialysis, will check back if time permits.   3:01 PM, 07/24/20 Lonell Grandchild, MPT Physical Therapist with [facility] 336 [phone] office [phone] mobile phone

## 2020-07-24 NOTE — Progress Notes (Signed)
   07/24/20 4473  Provider Notification  Provider Name/Title Carlynn Purl, DO  Date Provider Notified 07/24/20  Time Provider Notified [phone]  Notification Type Page  Notification Reason Critical result  Test performed and critical result Hemoglobin 6.7  Date Critical Result Received 07/24/20  Time Critical Result Received [id number]

## 2020-07-24 NOTE — Progress Notes (Signed)
PROGRESS NOTE   Carly Wood  ZSW:[id number] DOB: [birth year] DOA: 07/22/2020 PCP: Gerlene Fee, NP   Chief Complaint  Patient presents with  . Rectal Bleeding   Level of care: Med-Surg  Brief Admission History:  85 y.o. female with medical history significant for recent hip fracture repair discharged to [facility] on aspirin 81 mg twice daily for DVT prophylaxis.  Patient has end-stage renal disease hemodialysis Monday Wednesday Friday.  Patient has type 2 diabetes mellitus, depression, dementia, hypertension, glaucoma, breast cancer.  She was sent to the emergency department from EMS from [facility] with symptoms of hematemesis, bright red blood in stool and black stools.  Assessment & Plan:   Principal Problem:   Acute upper GI bleed Active Problems:   ESRD (end stage renal disease) (HCC)   Aromatase inhibitor use   Lymphedema of arm   Dysphagia   GERD (gastroesophageal reflux disease)   Multiple gastric ulcers   Iron deficiency anemia   Multiple myeloma (HCC)   Diabetes mellitus with ESRD (end-stage renal disease) ([place])   Hypertension due to end stage renal disease caused by type 2 diabetes mellitus, on dialysis ([place])   Anemia due to end stage renal disease (HCC)   Herpes   Vascular dementia with behavior disturbance (HCC)   Dependence on renal dialysis ([place])   Aortic atherosclerosis (HCC)   Atherosclerotic peripheral vascular disease (HCC)   Hyperlipidemia associated with type 2 diabetes mellitus ([place])   Closed right hip fracture (HCC)   Major depression, recurrent, chronic (HCC)   Rectal bleeding   Acute blood loss anemia   Duodenal ulcer   1)Acute GI bleeding/PUD - History of multiple gastric ulcers-  EGD on 07/23/2020 with gastric and duodenal ulcers, IV Protonix for 72 hours advised by GI service Also had bright red rectal bleeding and clots that we have seen could also be having lower GI bleeding source as well.   -Transfusion as below #2, monitor  H&H  2) acute on chronic anemia due to  ABLA/GI bleed -At baseline Patient has anemia of ESRD, now with acute GI bleed/ABLA superimposed on chronic anemia -Hgb is down to 6.7 from 7.8  -We will give additional unit of PRBC with HD on 07/24/2020 -Procrit/Epogen per nephrology service  3)End-stage renal disease on hemodialysis- had HD on 07/24/2020, nephrology input appreciated  4)Status post right hip fracture and repair- should have staples removed in about 4 days on 07/27/2020 (2 weeks postop)  5)DM2-- with renal and neurological complications -continue basal insulin along with Use Novolog/Humalog Sliding scale insulin with Accu-Cheks/Fingersticks as ordered  6)HTN-BP improved with PRBC transfusion and IV fluids - Resume home blood pressure medications at lower doses for soft BP.   7)Vascular dementia -baseline cognitive and memory deficits, delirium precautions.    8)History of breast cancer- patient has been maintained on aromatase inhibitor therapy.   DVT prophylaxis: SCD Code Status: Full   Family Communication:    Disposition Plan: return to SNF when medically stabilized   Consults called: GI, Nephrology   Admission status: INP   Status is: Inpatient  Remains inpatient appropriate because:IV treatments appropriate due to intensity of illness or inability to take PO and Inpatient level of care appropriate due to severity of illness   Dispo: The patient is from: SNF              Anticipated d/c is to: SNF              Patient currently is  not medically stable to d/c.   Difficult to place patient No   Consultants:   GI   Procedures:   EGD  07/23/20   Antimicrobials:    Subjective: -Tolerating hemodialysis okay, no new concerns   Objective: Vitals:   07/24/20 1237 07/24/20 1300 07/24/20 1400 07/24/20 1500  BP: (!) 116/56 (!) 106/34 (!) 107/34 (!) 126/39  Pulse: 84  74   Resp:  18 (!) 37 19  Temp: 98.4 F (36.9 C)     TempSrc: Oral     SpO2: 100%  98%    Weight: 64.2 kg     Height:        Intake/Output Summary (Last 24 hours) at 07/24/2020 1647 Last data filed at 07/24/2020 1507 Gross per 24 hour  Intake 1633.85 ml  Output 1500 ml  Net 133.85 ml   Filed Weights   07/23/20 0500 07/24/20 0900 07/24/20 1237  Weight: 64.5 kg 65.5 kg 64.2 kg    Examination:  General exam: chronically ill appearing female, awake, alert, Appears calm and comfortable  Neck-left IJ HD catheter Respiratory system: no increased work of breathing. Respiratory effort normal. Cardiovascular system: normal S1 & S2 heard. No JVD, murmurs, rubs,  Gastrointestinal system: Abdomen is nondistended, soft and nontender. No organomegaly or masses felt. Normal bowel sounds heard. Central nervous system: Alert and oriented but confused. No focal neurological deficits. Skin: No rashes, lesions or ulcers Psychiatry: Judgement and insight appear poor. Mood & affect irritable.  MSK-right antecubital fossa with AV fistula with positive thrill and bruit, right hip postop wound healing   Data Reviewed: I have personally reviewed following labs and imaging studies  CBC: Recent Labs  Lab 07/22/20 0841 07/23/20 0525 07/23/20 1504 07/24/20 0457  WBC 11.6* 21.4* 18.3* 14.3*  NEUTROABS 9.2*  --   --   --   HGB 7.1* 7.8* 7.5* 6.7*  HCT 23.5* 24.8* 23.5* 21.2*  MCV 104.0* 99.2 99.6 99.5  PLT 444* 390 382 754    Basic Metabolic Panel: Recent Labs  Lab 07/22/20 0841 07/23/20 0525 07/23/20 1504 07/24/20 0457  NA 140 136 134* 133*  K 4.5 3.7 4.4 4.7  CL 96* 93* 94* 93*  CO2 '27 27 28 27  ' GLUCOSE 214* 162* 138* 96  BUN 97* 38* 42* 49*  CREATININE 9.18* 4.76* 5.67* 6.55*  CALCIUM 8.0* 8.4* 8.1* 8.0*  MG  --  2.0  --  2.0  PHOS  --  2.8 4.1 3.8    GFR: Estimated Creatinine Clearance: 6 mL/min (A) (by C-G formula based on SCr of 6.55 mg/dL (H)).  Liver Function Tests: Recent Labs  Lab 07/23/20 1504  ALBUMIN 2.9*    CBG: Recent Labs  Lab 07/23/20 1607  07/23/20 2100 07/24/20 0429 07/24/20 0749 07/24/20 1139  GLUCAP 138* 128* 92 134* 81    Recent Results (from the past 240 hour(s))  Resp Panel by RT-PCR (Flu A&B, Covid) Nasopharyngeal Swab     Status: None   Collection Time: 07/22/20  8:22 AM   Specimen: Nasopharyngeal Swab; Nasopharyngeal(NP) swabs in vial transport medium  Result Value Ref Range Status   SARS Coronavirus 2 by RT PCR NEGATIVE NEGATIVE Final    Comment: (NOTE) SARS-CoV-2 target nucleic acids are NOT DETECTED.  The SARS-CoV-2 RNA is generally detectable in upper respiratory specimens during the acute phase of infection. The lowest concentration of SARS-CoV-2 viral copies this assay can detect is 138 copies/mL. A negative result does not preclude SARS-Cov-2 infection and should not be used  as the sole basis for treatment or other patient management decisions. A negative result may occur with  improper specimen collection/handling, submission of specimen other than nasopharyngeal swab, presence of viral mutation(s) within the areas targeted by this assay, and inadequate number of viral copies(<138 copies/mL). A negative result must be combined with clinical observations, patient history, and epidemiological information. The expected result is Negative.  Fact Sheet for Patients:  EntrepreneurPulse.com.au  Fact Sheet for Healthcare Providers:  IncredibleEmployment.be  This test is no t yet approved or cleared by the [place] FDA and  has been authorized for detection and/or diagnosis of SARS-CoV-2 by FDA under an Emergency Use Authorization (EUA). This EUA will remain  in effect (meaning this test can be used) for the duration of the COVID-19 declaration under Section 564(b)(1) of the Act, 21 U.S.C.section 360bbb-3(b)(1), unless the authorization is terminated  or revoked sooner.       Influenza A by PCR NEGATIVE NEGATIVE Final   Influenza B by PCR NEGATIVE NEGATIVE  Final    Comment: (NOTE) The Xpert Xpress SARS-CoV-2/FLU/RSV plus assay is intended as an aid in the diagnosis of influenza from Nasopharyngeal swab specimens and should not be used as a sole basis for treatment. Nasal washings and aspirates are unacceptable for Xpert Xpress SARS-CoV-2/FLU/RSV testing.  Fact Sheet for Patients: EntrepreneurPulse.com.au  Fact Sheet for Healthcare Providers: IncredibleEmployment.be  This test is not yet approved or cleared by the [place] FDA and has been authorized for detection and/or diagnosis of SARS-CoV-2 by FDA under an Emergency Use Authorization (EUA). This EUA will remain in effect (meaning this test can be used) for the duration of the COVID-19 declaration under Section 564(b)(1) of the Act, 21 U.S.C. section 360bbb-3(b)(1), unless the authorization is terminated or revoked.  Performed at [facility], [address] [place], [place] [address]   MRSA PCR Screening     Status: None   Collection Time: 07/22/20 11:55 AM   Specimen: Nasopharyngeal  Result Value Ref Range Status   MRSA by PCR NEGATIVE NEGATIVE Final    Comment:        The GeneXpert MRSA Assay (FDA approved for NASAL specimens only), is one component of a comprehensive MRSA colonization surveillance program. It is not intended to diagnose MRSA infection nor to guide or monitor treatment for MRSA infections. Performed at [facility], [address] [place], [place] [address]      Radiology Studies: DG CHEST PORT 1 VIEW  Result Date: 07/23/2020 CLINICAL DATA:  Dyspnea, leukocytosis. EXAM: PORTABLE CHEST 1 VIEW COMPARISON:  Jul 14, 2020. FINDINGS: The heart size and mediastinal contours are within normal limits. Both lungs are clear. Stable position of left internal jugular dialysis catheter. No pneumothorax or pleural effusion is noted. The visualized skeletal structures are unremarkable. IMPRESSION: No active disease. Aortic  Atherosclerosis (ICD10-I70.0). Electronically Signed   By: Marijo Conception M.D.   On: 07/23/2020 09:25   Scheduled Meds: . acyclovir  200 mg Oral Daily  . anastrozole  1 mg Oral Daily  . atorvastatin  10 mg Oral QHS  . calcium-vitamin D  1 tablet Oral Daily  . Chlorhexidine Gluconate Cloth  6 each Topical Q0600  . Chlorhexidine Gluconate Cloth  6 each Topical Q0600  . cloNIDine  0.2 mg Oral BID  . darbepoetin (ARANESP) injection - DIALYSIS  150 mcg Intravenous Q Mon-HD  . doxercalciferol  2 mcg Intravenous Q M,W,F-HD  . insulin aspart  0-6 Units Subcutaneous TID WC  . insulin aspart  8 Units Subcutaneous TID WC  . insulin glargine  10 Units Subcutaneous QHS  . melatonin  6 mg Oral QHS  . multivitamin with minerals  1 tablet Oral Q lunch  . sertraline  50 mg Oral Daily  . sucralfate  1 g Oral TID WC & HS   Continuous Infusions: . sodium chloride    . sodium chloride    . lactated ringers    . pantoprozole (PROTONIX) infusion 8 mg/hr (07/24/20 1507)     LOS: 2 days   Roxan Hockey, MD How to contact the [facility] Attending or Consulting provider [place] or covering provider during after hours [place], for this patient?  1. Check the care team in [facility] and look for a) attending/consulting TR[facility] provider listed and b) the [facility] team listed 2. Log into [email or web address] and use [place]'s universal password to access. If you do not have the password, please contact the hospital operator. 3. Locate the [facility] provider you are looking for under [facility] and page to a number that you can be directly reached. 4. If you still have difficulty reaching the provider, please page the [facility] (Director on Call) for the Hospitalists listed on amion for assistance.  07/24/2020, 4:47 PM

## 2020-07-24 NOTE — Progress Notes (Signed)
[facility] ROUNDING NOTE   Subjective:   Brief History :  85 year old  ESRD  MWF dialysis , dementia and diabetes. Recent history of hip fracture was brought into ER with GI bleed. Underwent EGD 5/17 with 2 small prepyloric ulcers and 2 bulbar ulcers with some pigmented material noted.  BP 112/74  P 84  sats 100 % RA   Na 133  K 4.7   Cl 93  Co2 27 Cr 6.55  Ca 8 Phos 3.8   Hb 6.7  Last dialysis 5/16  With about 1 L removed   Next dialysis 5/18   Objective:  Vital signs in last 24 hours:  Temp:  [98.4 F (36.9 C)-99 F (37.2 C)] 98.8 F (37.1 C) (05/18 0400) Pulse Rate:  [68-100] 79 (05/18 0800) Resp:  [0-52] 22 (05/18 0800) BP: (91-135)/(31-76) 132/74 (05/18 0800) SpO2:  [94 %-100 %] 97 % (05/18 0800)  Weight change:  Filed Weights   07/22/20 1234 07/22/20 2000 07/23/20 0500  Weight: 64.5 kg 64.5 kg 64.5 kg    Intake/Output: I/O last 3 completed shifts: In: 1542.8 [P.O.:600; I.V.:656.8; Blood:286] Out: 009 [Other:992]   Intake/Output this shift:  No intake/output data recorded.  CVS- RRR RS- CTA ABD- BS present soft non-distended EXT- no edema   Basic Metabolic Panel: Recent Labs  Lab 07/22/20 0841 07/23/20 0525 07/23/20 1504 07/24/20 0457  NA 140 136 134* 133*  K 4.5 3.7 4.4 4.7  CL 96* 93* 94* 93*  CO2 27 27 28 27   GLUCOSE 214* 162* 138* 96  BUN 97* 38* 42* 49*  CREATININE 9.18* 4.76* 5.67* 6.55*  CALCIUM 8.0* 8.4* 8.1* 8.0*  MG  --  2.0  --  2.0  PHOS  --  2.8 4.1 3.8    Liver Function Tests: Recent Labs  Lab 07/23/20 1504  ALBUMIN 2.9*   No results for input(s): LIPASE, AMYLASE in the last 168 hours. No results for input(s): AMMONIA in the last 168 hours.  CBC: Recent Labs  Lab 07/22/20 0841 07/23/20 0525 07/23/20 1504 07/24/20 0457  WBC 11.6* 21.4* 18.3* 14.3*  NEUTROABS 9.2*  --   --   --   HGB 7.1* 7.8* 7.5* 6.7*  HCT 23.5* 24.8* 23.5* 21.2*  MCV 104.0* 99.2 99.6 99.5  PLT 444* 390 382 385    Cardiac  Enzymes: No results for input(s): CKTOTAL, CKMB, CKMBINDEX, TROPONINI in the last 168 hours.  BNP: Invalid input(s): POCBNP  CBG: Recent Labs  Lab 07/23/20 1128 07/23/20 1607 07/23/20 2100 07/24/20 0429 07/24/20 0749  GLUCAP 155* 138* 128* 92 134*    Microbiology: Results for orders placed or performed during the hospital encounter of 07/22/20  Resp Panel by RT-PCR (Flu A&B, Covid) Nasopharyngeal Swab     Status: None   Collection Time: 07/22/20  8:22 AM   Specimen: Nasopharyngeal Swab; Nasopharyngeal(NP) swabs in vial transport medium  Result Value Ref Range Status   SARS Coronavirus 2 by RT PCR NEGATIVE NEGATIVE Final    Comment: (NOTE) SARS-CoV-2 target nucleic acids are NOT DETECTED.  The SARS-CoV-2 RNA is generally detectable in upper respiratory specimens during the acute phase of infection. The lowest concentration of SARS-CoV-2 viral copies this assay can detect is 138 copies/mL. A negative result does not preclude SARS-Cov-2 infection and should not be used as the sole basis for treatment or other patient management decisions. A negative result may occur with  improper specimen collection/handling, submission of specimen other than nasopharyngeal swab, presence of viral  mutation(s) within the areas targeted by this assay, and inadequate number of viral copies(<138 copies/mL). A negative result must be combined with clinical observations, patient history, and epidemiological information. The expected result is Negative.  Fact Sheet for Patients:  EntrepreneurPulse.com.au  Fact Sheet for Healthcare Providers:  IncredibleEmployment.be  This test is no t yet approved or cleared by the [place] FDA and  has been authorized for detection and/or diagnosis of SARS-CoV-2 by FDA under an Emergency Use Authorization (EUA). This EUA will remain  in effect (meaning this test can be used) for the duration of the COVID-19 declaration  under Section 564(b)(1) of the Act, 21 U.S.C.section 360bbb-3(b)(1), unless the authorization is terminated  or revoked sooner.       Influenza A by PCR NEGATIVE NEGATIVE Final   Influenza B by PCR NEGATIVE NEGATIVE Final    Comment: (NOTE) The Xpert Xpress SARS-CoV-2/FLU/RSV plus assay is intended as an aid in the diagnosis of influenza from Nasopharyngeal swab specimens and should not be used as a sole basis for treatment. Nasal washings and aspirates are unacceptable for Xpert Xpress SARS-CoV-2/FLU/RSV testing.  Fact Sheet for Patients: EntrepreneurPulse.com.au  Fact Sheet for Healthcare Providers: IncredibleEmployment.be  This test is not yet approved or cleared by the [place] FDA and has been authorized for detection and/or diagnosis of SARS-CoV-2 by FDA under an Emergency Use Authorization (EUA). This EUA will remain in effect (meaning this test can be used) for the duration of the COVID-19 declaration under Section 564(b)(1) of the Act, 21 U.S.C. section 360bbb-3(b)(1), unless the authorization is terminated or revoked.  Performed at [facility], [address] [place], [place] [address]   MRSA PCR Screening     Status: None   Collection Time: 07/22/20 11:55 AM   Specimen: Nasopharyngeal  Result Value Ref Range Status   MRSA by PCR NEGATIVE NEGATIVE Final    Comment:        The GeneXpert MRSA Assay (FDA approved for NASAL specimens only), is one component of a comprehensive MRSA colonization surveillance program. It is not intended to diagnose MRSA infection nor to guide or monitor treatment for MRSA infections. Performed at [facility], [address], [place], [place] [address]     Coagulation Studies: No results for input(s): LABPROT, INR in the last 72 hours.  Urinalysis: No results for input(s): COLORURINE, LABSPEC, PHURINE, GLUCOSEU, HGBUR, BILIRUBINUR, KETONESUR, PROTEINUR, UROBILINOGEN, NITRITE,  LEUKOCYTESUR in the last 72 hours.  Invalid input(s): APPERANCEUR    Imaging: DG CHEST PORT 1 VIEW  Result Date: 07/23/2020 CLINICAL DATA:  Dyspnea, leukocytosis. EXAM: PORTABLE CHEST 1 VIEW COMPARISON:  Jul 14, 2020. FINDINGS: The heart size and mediastinal contours are within normal limits. Both lungs are clear. Stable position of left internal jugular dialysis catheter. No pneumothorax or pleural effusion is noted. The visualized skeletal structures are unremarkable. IMPRESSION: No active disease. Aortic Atherosclerosis (ICD10-I70.0). Electronically Signed   By: Marijo Conception M.D.   On: 07/23/2020 09:25     Medications:   . sodium chloride    . sodium chloride    . lactated ringers    . pantoprozole (PROTONIX) infusion 8 mg/hr (07/24/20 0600)   . sodium chloride   Intravenous Once  . acyclovir  200 mg Oral Daily  . amLODipine  5 mg Oral Daily  . anastrozole  1 mg Oral Daily  . atorvastatin  10 mg Oral QHS  . calcium-vitamin D  1 tablet Oral Daily  . Chlorhexidine Gluconate Cloth  6 each  Topical Q0600  . Chlorhexidine Gluconate Cloth  6 each Topical Q0600  . cloNIDine  0.2 mg Oral BID  . darbepoetin (ARANESP) injection - DIALYSIS  150 mcg Intravenous Q Mon-HD  . doxercalciferol  2 mcg Intravenous Q M,W,F-HD  . insulin aspart  0-6 Units Subcutaneous TID WC  . insulin aspart  8 Units Subcutaneous TID WC  . insulin glargine  10 Units Subcutaneous QHS  . melatonin  6 mg Oral QHS  . multivitamin with minerals  1 tablet Oral Q lunch  . sertraline  50 mg Oral Daily  . sucralfate  1 g Oral TID WC & HS   sodium chloride, sodium chloride, acetaminophen **OR** acetaminophen, alteplase, heparin, ondansetron **OR** ondansetron (ZOFRAN) IV  Assessment/ Plan:   ESRD-  MWF dialysis will continue on schedule     ANEMIA-  GI bleed  Darbepoietin 150 mcg weekly  Last dose Monday 5/16    Transfusion per primary. No heparin with dialysis  MBD- stable at this point. Continuing on hectorol.  Phoslo on hold  And phos is ok  HTN/VOL- appears euvolemic at this time.  Will stop amlodipine as BP lowish side  ACCESS- L TDC  Diabetes as per primary    LOS: 2 Sherril Croon @TODAY @8 :47 AM

## 2020-07-25 LAB — BASIC METABOLIC PANEL
Anion gap: 11 (ref 5–15)
BUN: 26 mg/dL — ABNORMAL HIGH (ref 8–23)
CO2: 26 mmol/L (ref 22–32)
Calcium: 7.9 mg/dL — ABNORMAL LOW (ref 8.9–10.3)
Chloride: 94 mmol/L — ABNORMAL LOW (ref 98–111)
Creatinine, Ser: 4.95 mg/dL — ABNORMAL HIGH (ref 0.44–1.00)
GFR, Estimated: 8 mL/min — ABNORMAL LOW (ref >60)
Glucose, Bld: 118 mg/dL — ABNORMAL HIGH (ref 70–99)
Potassium: 4 mmol/L (ref 3.5–5.1)
Sodium: 131 mmol/L — ABNORMAL LOW (ref 135–145)

## 2020-07-25 LAB — CBC
HCT: 24.1 % — ABNORMAL LOW (ref 36.0–46.0)
Hemoglobin: 7.8 g/dL — ABNORMAL LOW (ref 12.0–15.0)
MCH: 31.6 pg (ref 26.0–34.0)
MCHC: 32.4 g/dL (ref 30.0–36.0)
MCV: 97.6 fL (ref 80.0–100.0)
Platelets: 346 10*3/uL (ref 150–400)
RBC: 2.47 MIL/uL — ABNORMAL LOW (ref 3.87–5.11)
RDW: 18.2 % — ABNORMAL HIGH (ref 11.5–15.5)
WBC: 11.8 10*3/uL — ABNORMAL HIGH (ref 4.0–10.5)
nRBC: 0.9 % — ABNORMAL HIGH (ref 0.0–0.2)

## 2020-07-25 LAB — TYPE AND SCREEN
ABO/RH(D): A POS
Antibody Screen: NEGATIVE
Unit division: 0
Unit division: 0

## 2020-07-25 LAB — BPAM RBC
Blood Product Expiration Date: 202206032359
Blood Product Expiration Date: 202206032359
ISSUE DATE / TIME: 202205162137
ISSUE DATE / TIME: 202205180840
Unit Type and Rh: 6200
Unit Type and Rh: 6200

## 2020-07-25 LAB — MAGNESIUM: Magnesium: 1.9 mg/dL (ref 1.7–2.4)

## 2020-07-25 LAB — GLUCOSE, CAPILLARY
Glucose-Capillary: 123 mg/dL — ABNORMAL HIGH (ref 70–99)
Glucose-Capillary: 118 mg/dL — ABNORMAL HIGH (ref 70–99)
Glucose-Capillary: 190 mg/dL — ABNORMAL HIGH (ref 70–99)
Glucose-Capillary: 87 mg/dL (ref 70–99)
Glucose-Capillary: 236 mg/dL — ABNORMAL HIGH (ref 70–99)

## 2020-07-25 LAB — PHOSPHORUS: Phosphorus: 2.8 mg/dL (ref 2.5–4.6)

## 2020-07-25 MED ORDER — CLONIDINE HCL 0.1 MG PO TABS
0.1000 mg | ORAL_TABLET | Freq: Every day | ORAL | Status: DC
  Filled 2020-07-25 (×2): qty 1

## 2020-07-25 MED ORDER — SODIUM CHLORIDE 0.9 % IV SOLN
8.0000 mg/h | INTRAVENOUS | Status: AC
  Administered 2020-07-25 (×2): 8 mg/h via INTRAVENOUS
  Filled 2020-07-25 (×3): qty 80

## 2020-07-25 MED ORDER — CHLORHEXIDINE GLUCONATE CLOTH 2 % EX PADS
6.0000 | MEDICATED_PAD | Freq: Every day | CUTANEOUS | Status: DC
  Administered 2020-07-25 – 2020-07-26 (×2): 6 via TOPICAL

## 2020-07-25 NOTE — Progress Notes (Signed)
Physical Therapy Evaluation Patient Details Name: Carly Wood MRN: [id number] DOB: 15-May-1935 Today's Date: 07/25/2020   History of Present Illness  Carly Wood is a 85 y.o. female with medical history significant for recent hip fracture repair discharged to [facility] on aspirin 81 mg twice daily for DVT prophylaxis.  Patient has end-stage renal disease hemodialysis Monday Wednesday Friday.  Patient has type 2 diabetes mellitus, depression, dementia, hypertension, glaucoma, breast cancer.  She was sent to the emergency department from EMS from [facility] with symptoms of hematemesis, bright red blood in stool and black stools.  Was noted by the nursing staff this morning at the [facility].  She denies symptoms of chest pain and shortness of breath.  She has had a mild productive cough for the past several days whitish sputum production but no fever or chills.  Hemoglobin was 9.4 on 07/17/2020 and dropped down to 7.1 today.  Patient was noted to be guaiac positive.  SARS 2 coronavirus test negative.  Patient was typed and crossed and 1 unit of packed red blood cells ordered.  Patient was started on IV Protonix infusion and GI was consulted.  Nephrology was also consulted for hemodialysis treatment which is scheduled for today.  The patient denies having abdominal pain.  She mostly complains of peripheral neuropathy pain.  Patient does have a history of multiple gastric ulcers.    Clinical Impression  Patient demonstrates labored movement for sitting up at bedside, unsteady on feet and limited to a few side steps before having to sit due to c/o fatigue and BLE weakness.  Patient tolerated sitting up in chair after therapy - RN notified.  Patient will benefit from continued physical therapy in hospital and recommended venue below to increase strength, balance, endurance for safe ADLs and gait.     Follow Up Recommendations SNF    Equipment Recommendations  None recommended by PT     Recommendations for Other Services       Precautions / Restrictions Restrictions Weight Bearing Restrictions: Yes RLE Weight Bearing: Weight bearing as tolerated      Mobility  Bed Mobility Overal bed mobility: Needs Assistance Bed Mobility: Supine to Sit     Supine to sit: Min assist     General bed mobility comments: increased time, labored movement    Transfers Overall transfer level: Needs assistance Equipment used: Rolling walker (2 wheeled) Transfers: Sit to/from [facility] Sit to Stand: Min assist;Mod assist Stand pivot transfers: Mod assist       General transfer comment: slow labored movement, buckling of knees when standing  Ambulation/Gait Ambulation/Gait assistance: Mod assist Gait Distance (Feet): 4 Feet Assistive device: Rolling walker (2 wheeled) Gait Pattern/deviations: Decreased step length - left;Decreased stance time - right;Decreased stride length;Shuffle Gait velocity: decreased   General Gait Details: limited to a few labored steps at bedside due to generalized weakness and fatigue  Stairs            Wheelchair Mobility    Modified Rankin (Stroke Patients Only)       Balance Overall balance assessment: Needs assistance Sitting-balance support: Feet supported;No upper extremity supported Sitting balance-Leahy Scale: Good Sitting balance - Comments: seated at EOB   Standing balance support: During functional activity;Bilateral upper extremity supported Standing balance-Leahy Scale: Poor Standing balance comment: fair/poor using RW  Pertinent Vitals/Pain Faces Pain Scale: Hurts a little bit Pain Location: right hip with movement Pain Descriptors / Indicators: Sore Pain Intervention(s): Limited activity within patient's tolerance;Monitored during session;Repositioned    Home Living Family/patient expects to be discharged to:: Private residence Living Arrangements:  Other relatives Available Help at Discharge: Family;Available 24 hours/day Type of Home: House Home Access: Level entry     Home Layout: One level Home Equipment: Cane - single point;Shower seat;Wheelchair - manual;Bedside commode      Prior Function Level of Independence: Needs assistance   Gait / Transfers Assistance Needed: household and short distanced community using [facility]  ADL's / Homemaking Assistance Needed: assisted by family        Hand Dominance        Extremity/Trunk Assessment   Upper Extremity Assessment Upper Extremity Assessment: Generalized weakness    Lower Extremity Assessment Lower Extremity Assessment: Generalized weakness    Cervical / Trunk Assessment Cervical / Trunk Assessment: Kyphotic  Communication   Communication: No difficulties  Cognition Arousal/Alertness: Awake/alert Behavior During Therapy: WFL for tasks assessed/performed Overall Cognitive Status: Within Functional Limits for tasks assessed                                        General Comments      Exercises     Assessment/Plan    PT Assessment Patient needs continued PT services  PT Problem List Decreased strength;Decreased activity tolerance;Decreased balance;Decreased mobility       PT Treatment Interventions DME instruction;Stair training;Functional mobility training;Therapeutic activities;Gait training;Balance training;Patient/family education    PT Goals (Current goals can be found in the Care Plan section)  Acute Rehab PT Goals Patient Stated Goal: return to SNF PT Goal Formulation: With patient Time For Goal Achievement: 08/07/20 Potential to Achieve Goals: Good    Frequency Min 3X/week   Barriers to discharge        Co-evaluation               AM-PAC PT "6 Clicks" Mobility  Outcome Measure Help needed turning from your back to your side while in a flat bed without using bedrails?: A Little Help needed moving from lying on your  back to sitting on the side of a flat bed without using bedrails?: A Little Help needed moving to and from a bed to a chair (including a wheelchair)?: A Lot Help needed standing up from a chair using your arms (e.g., wheelchair or bedside chair)?: A Lot Help needed to walk in hospital room?: A Lot Help needed climbing 3-5 steps with a railing? : Total 6 Click Score: 13    End of Session   Activity Tolerance: Patient tolerated treatment well;Patient limited by fatigue Patient left: in chair;with call bell/phone within reach;with chair alarm set Nurse Communication: Mobility status PT Visit Diagnosis: Unsteadiness on feet (R26.81);Other abnormalities of gait and mobility (R26.89);Muscle weakness (generalized) (M62.81)    Time: 8916-9450 PT Time Calculation (min) (ACUTE ONLY): 23 min   Charges:   PT Evaluation $PT Eval Moderate Complexity: 1 Mod PT Treatments $Therapeutic Activity: 23-37 mins        1:56 PM, 07/25/20 Lonell Grandchild, MPT Physical Therapist with [facility] [phone] office [phone] mobile phone

## 2020-07-25 NOTE — Progress Notes (Signed)
[facility] ROUNDING NOTE   Subjective:   Brief History :  85 year old  ESRD  MWF dialysis , dementia and diabetes. Recent history of hip fracture was brought into ER with GI bleed. Underwent EGD 5/17 with 2 small prepyloric ulcers and 2 bulbar ulcers with some pigmented material noted.  BP 108/48 pulse 84 temperature 98.1 O2 sats 97% room air   Na 131 potassium 4 chloride 94 CO2 26 BUN 26 creatinine 4.95 glucose 118 calcium 7.9 magnesium 1.9 phosphorus 2.8 hemoglobin 7.8  Last dialysis 07/24/2020 with removal of 1.5 L.  Status post transfusion 07/24/2020   Objective:  Vital signs in last 24 hours:  Temp:  [98 F (36.7 C)-99.1 F (37.3 C)] 98.1 F (36.7 C) (05/19 0830) Pulse Rate:  [74-97] 97 (05/18 1700) Resp:  [12-37] 21 (05/19 0800) BP: (100-155)/(32-91) 117/50 (05/19 0800) SpO2:  [95 %-100 %] 97 % (05/19 0600) Weight:  [64.2 kg-66.9 kg] 66.9 kg (05/19 0439)  Weight change:  Filed Weights   07/24/20 0900 07/24/20 1237 07/25/20 0439  Weight: 65.5 kg 64.2 kg 66.9 kg    Intake/Output: I/O last 3 completed shifts: In: 1604 [P.O.:358; I.V.:285; Blood:961] Out: 1500 [Other:1500]   Intake/Output this shift:  No intake/output data recorded.  CVS- RRR RS- CTA ABD- BS present soft non-distended EXT- no edema   Basic Metabolic Panel: Recent Labs  Lab 07/22/20 0841 07/23/20 0525 07/23/20 1504 07/24/20 0457 07/25/20 0503  NA 140 136 134* 133* 131*  K 4.5 3.7 4.4 4.7 4.0  CL 96* 93* 94* 93* 94*  CO2 27 27 28 27 26   GLUCOSE 214* 162* 138* 96 118*  BUN 97* 38* 42* 49* 26*  CREATININE 9.18* 4.76* 5.67* 6.55* 4.95*  CALCIUM 8.0* 8.4* 8.1* 8.0* 7.9*  MG  --  2.0  --  2.0 1.9  PHOS  --  2.8 4.1 3.8 2.8    Liver Function Tests: Recent Labs  Lab 07/23/20 1504  ALBUMIN 2.9*   No results for input(s): LIPASE, AMYLASE in the last 168 hours. No results for input(s): AMMONIA in the last 168 hours.  CBC: Recent Labs  Lab 07/22/20 0841 07/23/20 0525  07/23/20 1504 07/24/20 0457 07/25/20 0503  WBC 11.6* 21.4* 18.3* 14.3* 11.8*  NEUTROABS 9.2*  --   --   --   --   HGB 7.1* 7.8* 7.5* 6.7* 7.8*  HCT 23.5* 24.8* 23.5* 21.2* 24.1*  MCV 104.0* 99.2 99.6 99.5 97.6  PLT 444* 390 382 385 346    Cardiac Enzymes: No results for input(s): CKTOTAL, CKMB, CKMBINDEX, TROPONINI in the last 168 hours.  BNP: Invalid input(s): POCBNP  CBG: Recent Labs  Lab 07/24/20 1139 07/24/20 1731 07/24/20 2053 07/25/20 0451 07/25/20 0828  GLUCAP 81 96 83 123* 118*    Microbiology: Results for orders placed or performed during the hospital encounter of 07/22/20  Resp Panel by RT-PCR (Flu A&B, Covid) Nasopharyngeal Swab     Status: None   Collection Time: 07/22/20  8:22 AM   Specimen: Nasopharyngeal Swab; Nasopharyngeal(NP) swabs in vial transport medium  Result Value Ref Range Status   SARS Coronavirus 2 by RT PCR NEGATIVE NEGATIVE Final    Comment: (NOTE) SARS-CoV-2 target nucleic acids are NOT DETECTED.  The SARS-CoV-2 RNA is generally detectable in upper respiratory specimens during the acute phase of infection. The lowest concentration of SARS-CoV-2 viral copies this assay can detect is 138 copies/mL. A negative result does not preclude SARS-Cov-2 infection and should not be used as  the sole basis for treatment or other patient management decisions. A negative result may occur with  improper specimen collection/handling, submission of specimen other than nasopharyngeal swab, presence of viral mutation(s) within the areas targeted by this assay, and inadequate number of viral copies(<138 copies/mL). A negative result must be combined with clinical observations, patient history, and epidemiological information. The expected result is Negative.  Fact Sheet for Patients:  EntrepreneurPulse.com.au  Fact Sheet for Healthcare Providers:  IncredibleEmployment.be  This test is no t yet approved or cleared by  the [place] FDA and  has been authorized for detection and/or diagnosis of SARS-CoV-2 by FDA under an Emergency Use Authorization (EUA). This EUA will remain  in effect (meaning this test can be used) for the duration of the COVID-19 declaration under Section 564(b)(1) of the Act, 21 U.S.C.section 360bbb-3(b)(1), unless the authorization is terminated  or revoked sooner.       Influenza A by PCR NEGATIVE NEGATIVE Final   Influenza B by PCR NEGATIVE NEGATIVE Final    Comment: (NOTE) The Xpert Xpress SARS-CoV-2/FLU/RSV plus assay is intended as an aid in the diagnosis of influenza from Nasopharyngeal swab specimens and should not be used as a sole basis for treatment. Nasal washings and aspirates are unacceptable for Xpert Xpress SARS-CoV-2/FLU/RSV testing.  Fact Sheet for Patients: EntrepreneurPulse.com.au  Fact Sheet for Healthcare Providers: IncredibleEmployment.be  This test is not yet approved or cleared by the [place] FDA and has been authorized for detection and/or diagnosis of SARS-CoV-2 by FDA under an Emergency Use Authorization (EUA). This EUA will remain in effect (meaning this test can be used) for the duration of the COVID-19 declaration under Section 564(b)(1) of the Act, 21 U.S.C. section 360bbb-3(b)(1), unless the authorization is terminated or revoked.  Performed at [facility], [address], [place], [place] [address]   MRSA PCR Screening     Status: None   Collection Time: 07/22/20 11:55 AM   Specimen: Nasopharyngeal  Result Value Ref Range Status   MRSA by PCR NEGATIVE NEGATIVE Final    Comment:        The GeneXpert MRSA Assay (FDA approved for NASAL specimens only), is one component of a comprehensive MRSA colonization surveillance program. It is not intended to diagnose MRSA infection nor to guide or monitor treatment for MRSA infections. Performed at [facility], [address] [place],  [place] [address]     Coagulation Studies: No results for input(s): LABPROT, INR in the last 72 hours.  Urinalysis: No results for input(s): COLORURINE, LABSPEC, PHURINE, GLUCOSEU, HGBUR, BILIRUBINUR, KETONESUR, PROTEINUR, UROBILINOGEN, NITRITE, LEUKOCYTESUR in the last 72 hours.  Invalid input(s): APPERANCEUR    Imaging: No results found.   Medications:   . sodium chloride    . sodium chloride     . acyclovir  200 mg Oral Daily  . anastrozole  1 mg Oral Daily  . atorvastatin  10 mg Oral QHS  . calcium-vitamin D  1 tablet Oral Daily  . Chlorhexidine Gluconate Cloth  6 each Topical Q0600  . Chlorhexidine Gluconate Cloth  6 each Topical Q0600  . cloNIDine  0.2 mg Oral BID  . darbepoetin (ARANESP) injection - DIALYSIS  150 mcg Intravenous Q Mon-HD  . doxercalciferol  2 mcg Intravenous Q M,W,F-HD  . insulin aspart  0-6 Units Subcutaneous TID WC  . insulin aspart  8 Units Subcutaneous TID WC  . insulin glargine  10 Units Subcutaneous QHS  . melatonin  6 mg Oral QHS  . multivitamin  with minerals  1 tablet Oral Q lunch  . sertraline  50 mg Oral Daily  . sucralfate  1 g Oral TID WC & HS   sodium chloride, sodium chloride, acetaminophen **OR** acetaminophen, alteplase, heparin, ondansetron **OR** ondansetron (ZOFRAN) IV  Assessment/ Plan:   ESRD-  MWF dialysis will continue on schedule.  Next dialysis to be 07/26/2020  ANEMIA-  GI bleed  Darbepoietin 150 mcg weekly  Last dose Monday 5/16    Transfusion per primary. No heparin with dialysis  MBD- stable at this point. Continuing on hectorol. Phoslo on hold  And phos is ok  HTN/VOL- appears euvolemic at this time.  Will decrease clonidine 0.1 mg nightly  ACCESS- L TDC  Diabetes as per primary    LOS: Start @TODAY @9 :15 AM

## 2020-07-25 NOTE — Plan of Care (Signed)
  Problem: Acute Rehab PT Goals(only PT should resolve) Goal: Pt Will Go Supine/Side To Sit Outcome: Progressing Flowsheets (Taken 07/25/2020 1358) Pt will go Supine/Side to Sit:  with min guard assist  with minimal assist Goal: Patient Will Transfer Sit To/From Stand Outcome: Progressing Flowsheets (Taken 07/25/2020 1358) Patient will transfer sit to/from stand:  with minimal assist  with min guard assist Goal: Pt Will Transfer Bed To Chair/Chair To Bed Outcome: Progressing Flowsheets (Taken 07/25/2020 1358) Pt will Transfer Bed to Chair/Chair to Bed: with min assist Goal: Pt Will Ambulate Outcome: Progressing Flowsheets (Taken 07/25/2020 1358) Pt will Ambulate:  15 feet  with minimal assist  with moderate assist  with rolling walker   1:58 PM, 07/25/20 Lonell Grandchild, MPT Physical Therapist with [facility] 336 [phone] office [phone] mobile phone

## 2020-07-25 NOTE — Progress Notes (Signed)
PROGRESS NOTE   Carly Wood  YVO:[id number] DOB: 1936/01/25 DOA: 07/22/2020 PCP: Gerlene Fee, NP   Chief Complaint  Patient presents with  . Rectal Bleeding   Level of care: Med-Surg  Brief Admission History:  85 y.o. female with medical history significant for recent hip fracture repair discharged to [facility] on aspirin 81 mg twice daily for DVT prophylaxis.  Patient has end-stage renal disease hemodialysis Monday Wednesday Friday.  Patient has type 2 diabetes mellitus, depression, dementia, hypertension, glaucoma, breast cancer.  She was sent to the emergency department from EMS from [facility] with symptoms of hematemesis, bright red blood in stool and black stools.  Assessment & Plan:   Principal Problem:   Acute upper GI bleed Active Problems:   ESRD (end stage renal disease) (HCC)   Aromatase inhibitor use   Lymphedema of arm   Dysphagia   GERD (gastroesophageal reflux disease)   Multiple gastric ulcers   Iron deficiency anemia   Multiple myeloma (HCC)   Diabetes mellitus with ESRD (end-stage renal disease) ([place])   Hypertension due to end stage renal disease caused by type 2 diabetes mellitus, on dialysis ([place])   Anemia due to end stage renal disease (HCC)   Herpes   Vascular dementia with behavior disturbance (HCC)   Dependence on renal dialysis ([place])   Aortic atherosclerosis (HCC)   Atherosclerotic peripheral vascular disease (HCC)   Hyperlipidemia associated with type 2 diabetes mellitus ([place])   Closed right hip fracture (HCC)   Major depression, recurrent, chronic (HCC)   Rectal bleeding   Acute blood loss anemia   Duodenal ulcer   1)Acute GI bleeding/PUD - History of multiple gastric ulcers-  EGD on 07/23/2020 with gastric and duodenal ulcers, IV Protonix for 72 hours post EGD advised by GI service thru 07/26/20 Also had bright red rectal bleeding and clots initially -No further BRBPR, patient did have dark stool on 07/24/2020 -Transfusion as  below #2, monitor H&H  2) acute on chronic anemia due to  ABLA/GI bleed -At baseline Patient has anemia of ESRD, now with acute GI bleed/ABLA superimposed on chronic anemia -Hgb is up to 7.8 from 6.7 after transfusion of additional 1 unit of PRBC with HD on 07/24/2020 -Procrit/Epogen per nephrology service  3)End-stage renal disease on hemodialysis- had HD on 07/24/2020, nephrology input appreciated  4)Status post right hip fracture and repair- should have staples removed in about 4 days on 07/27/2020 (2 weeks postop)  5)DM2-- with renal and neurological complications -continue basal insulin along with Use Novolog/Humalog Sliding scale insulin with Accu-Cheks/Fingersticks as ordered  6)HTN-BP improved with PRBC transfusion and IV fluids - Resume home blood pressure medications at lower doses for soft BP.   7)Vascular dementia -baseline cognitive and memory deficits, delirium precautions.    8)History of breast cancer- patient has been maintained on aromatase inhibitor therapy.   DVT prophylaxis: SCD Code Status: Full   Family Communication:    Disposition Plan: return to SNF when medically stabilized   Consults called: GI, Nephrology   Admission status: INP   Status is: Inpatient  Remains inpatient appropriate because:Requires IV  Protonix for extensive gastric and duodenal bleeding ulcers, H&H needs to be monitored, may need additional transfusion   Dispo: The patient is from: SNF              Anticipated d/c is to: SNF in 1 day              Patient currently is not medically  stable to d/c.   Difficult to place patient No   Consultants:   GI   Procedures:   EGD  07/23/20   Antimicrobials:    Subjective: -1 dark stools on 07/24/2020, tolerating oral intake okay -   Objective: Vitals:   07/25/20 0900 07/25/20 1000 07/25/20 1100 07/25/20 1110  BP: (!) 108/48 (!) 125/38 (!) 142/48   Pulse:      Resp: (!) 9 (!) 39 15   Temp:    98 F (36.7 C)  TempSrc:      SpO2:    100%   Weight:      Height:        Intake/Output Summary (Last 24 hours) at 07/25/2020 1213 Last data filed at 07/25/2020 0300 Gross per 24 hour  Intake 292.98 ml  Output 1500 ml  Net -1207.02 ml   Filed Weights   07/24/20 0900 07/24/20 1237 07/25/20 0439  Weight: 65.5 kg 64.2 kg 66.9 kg    Examination:  General exam: chronically ill appearing female, awake, alert, Appears calm and comfortable  Neck-left IJ HD catheter Respiratory system: no increased work of breathing. Respiratory effort normal. Cardiovascular system: normal S1 & S2 heard. No JVD, murmurs, rubs,  Gastrointestinal system: Abdomen is ND, soft and NT. +Bs Central nervous system: Generalized weakness,. No New focal neurological deficits. Skin: No rashes, lesions or ulcers Psychiatry: Judgement and insight appear poor. Mood & affect irritable.  MSK-Rt UE swelling, right antecubital fossa with AV fistula with positive thrill and bruit, right hip postop wound healing   Data Reviewed: I have personally reviewed following labs and imaging studies  CBC: Recent Labs  Lab 07/22/20 0841 07/23/20 0525 07/23/20 1504 07/24/20 0457 07/25/20 0503  WBC 11.6* 21.4* 18.3* 14.3* 11.8*  NEUTROABS 9.2*  --   --   --   --   HGB 7.1* 7.8* 7.5* 6.7* 7.8*  HCT 23.5* 24.8* 23.5* 21.2* 24.1*  MCV 104.0* 99.2 99.6 99.5 97.6  PLT 444* 390 382 385 829    Basic Metabolic Panel: Recent Labs  Lab 07/22/20 0841 07/23/20 0525 07/23/20 1504 07/24/20 0457 07/25/20 0503  NA 140 136 134* 133* 131*  K 4.5 3.7 4.4 4.7 4.0  CL 96* 93* 94* 93* 94*  CO2 _0 GLUCOSE 214* 162* 138* 96 118*  BUN 97* 38* 42* 49* 26*  CREATININE 9.18* 4.76* 5.67* 6.55* 4.95*  CALCIUM 8.0* 8.4* 8.1* 8.0* 7.9*  MG  --  2.0  --  2.0 1.9  PHOS  --  2.8 4.1 3.8 2.8    GFR: Estimated Creatinine Clearance: 8.1 mL/min (A) (by C-G formula based on SCr of 4.95 mg/dL (H)).  Liver Function Tests: Recent Labs  Lab 07/23/20 1504  ALBUMIN 2.9*     CBG: Recent Labs  Lab 07/24/20 1731 07/24/20 2053 07/25/20 0451 07/25/20 0828 07/25/20 1131  GLUCAP 96 83 123* 118* 190*    Recent Results (from the past 240 hour(s))  Resp Panel by RT-PCR (Flu A&B, Covid) Nasopharyngeal Swab     Status: None   Collection Time: 07/22/20  8:22 AM   Specimen: Nasopharyngeal Swab; Nasopharyngeal(NP) swabs in vial transport medium  Result Value Ref Range Status   SARS Coronavirus 2 by RT PCR NEGATIVE NEGATIVE Final    Comment: (NOTE) SARS-CoV-2 target nucleic acids are NOT DETECTED.  The SARS-CoV-2 RNA is generally detectable in upper respiratory specimens during the acute phase of infection. The lowest concentration of SARS-CoV-2 viral copies this assay can detect  is 138 copies/mL. A negative result does not preclude SARS-Cov-2 infection and should not be used as the sole basis for treatment or other patient management decisions. A negative result may occur with  improper specimen collection/handling, submission of specimen other than nasopharyngeal swab, presence of viral mutation(s) within the areas targeted by this assay, and inadequate number of viral copies(<138 copies/mL). A negative result must be combined with clinical observations, patient history, and epidemiological information. The expected result is Negative.  Fact Sheet for Patients:  EntrepreneurPulse.com.au  Fact Sheet for Healthcare Providers:  IncredibleEmployment.be  This test is no t yet approved or cleared by the [place] FDA and  has been authorized for detection and/or diagnosis of SARS-CoV-2 by FDA under an Emergency Use Authorization (EUA). This EUA will remain  in effect (meaning this test can be used) for the duration of the COVID-19 declaration under Section 564(b)(1) of the Act, 21 U.S.C.section 360bbb-3(b)(1), unless the authorization is terminated  or revoked sooner.       Influenza A by PCR NEGATIVE NEGATIVE  Final   Influenza B by PCR NEGATIVE NEGATIVE Final    Comment: (NOTE) The Xpert Xpress SARS-CoV-2/FLU/RSV plus assay is intended as an aid in the diagnosis of influenza from Nasopharyngeal swab specimens and should not be used as a sole basis for treatment. Nasal washings and aspirates are unacceptable for Xpert Xpress SARS-CoV-2/FLU/RSV testing.  Fact Sheet for Patients: EntrepreneurPulse.com.au  Fact Sheet for Healthcare Providers: IncredibleEmployment.be  This test is not yet approved or cleared by the [place] FDA and has been authorized for detection and/or diagnosis of SARS-CoV-2 by FDA under an Emergency Use Authorization (EUA). This EUA will remain in effect (meaning this test can be used) for the duration of the COVID-19 declaration under Section 564(b)(1) of the Act, 21 U.S.C. section 360bbb-3(b)(1), unless the authorization is terminated or revoked.  Performed at [facility], [address], [place], [place] [address]   MRSA PCR Screening     Status: None   Collection Time: 07/22/20 11:55 AM   Specimen: Nasopharyngeal  Result Value Ref Range Status   MRSA by PCR NEGATIVE NEGATIVE Final    Comment:        The GeneXpert MRSA Assay (FDA approved for NASAL specimens only), is one component of a comprehensive MRSA colonization surveillance program. It is not intended to diagnose MRSA infection nor to guide or monitor treatment for MRSA infections. Performed at [facility], [address] [place], [place] [address]      Radiology Studies: No results found. Scheduled Meds: . acyclovir  200 mg Oral Daily  . anastrozole  1 mg Oral Daily  . atorvastatin  10 mg Oral QHS  . calcium-vitamin D  1 tablet Oral Daily  . Chlorhexidine Gluconate Cloth  6 each Topical Q0600  . Chlorhexidine Gluconate Cloth  6 each Topical Q0600  . Chlorhexidine Gluconate Cloth  6 each Topical Q0600  . cloNIDine  0.1 mg Oral Daily  . darbepoetin  (ARANESP) injection - DIALYSIS  150 mcg Intravenous Q Mon-HD  . doxercalciferol  2 mcg Intravenous Q M,W,F-HD  . insulin aspart  0-6 Units Subcutaneous TID WC  . insulin aspart  8 Units Subcutaneous TID WC  . insulin glargine  10 Units Subcutaneous QHS  . melatonin  6 mg Oral QHS  . multivitamin with minerals  1 tablet Oral Q lunch  . sertraline  50 mg Oral Daily  . sucralfate  1 g Oral TID WC & HS  Continuous Infusions: . sodium chloride    . sodium chloride    . pantoprozole (PROTONIX) infusion 8 mg/hr (07/25/20 1144)     LOS: 3 days   Roxan Hockey, MD How to contact the [facility] Attending or Consulting provider [place] or covering provider during after hours [place], for this patient?  1. Check the care team in [facility] and look for a) attending/consulting TRH provider listed and b) the [facility] team listed 2. Log into [email or web address] and use [place]'s universal password to access. If you do not have the password, please contact the hospital operator. 3. Locate the [facility] provider you are looking for under [facility] and page to a number that you can be directly reached. 4. If you still have difficulty reaching the provider, please page the [facility] (Director on Call) for the Hospitalists listed on amion for assistance.  07/25/2020, 12:13 PM

## 2020-07-25 NOTE — Progress Notes (Signed)
    Subjective: Doing well this morning.  No complaints.  Denies abdominal pain, nausea, vomiting.  No bowel movement today.  Reports having a bowel movement yesterday.  Per nursing staff, no overt GI bleeding since single black stool yesterday.  States she is hungry.  She is receiving a regular diet. Nursing reports she ate all of her breakfast this morning.  Objective: Vital signs in last 24 hours: Temp:  [98 F (36.7 C)-99.1 F (37.3 C)] 98.1 F (36.7 C) (05/19 0830) Pulse Rate:  [74-97] 97 (05/18 1700) Resp:  [9-39] 15 (05/19 1100) BP: (100-155)/(32-91) 142/48 (05/19 1100) SpO2:  [95 %-100 %] 100 % (05/19 1100) Weight:  [64.2 kg-66.9 kg] 66.9 kg (05/19 0439) Last BM Date: 07/24/20 General:   Alert and oriented, pleasant, sitting in recliner. Head:  Normocephalic and atraumatic. Abdomen:  Bowel sounds present, soft, non-tender, non-distended. No HSM or hernias noted. No rebound or guarding. No masses appreciated  Extremities:  Swelling in her right hand. No erythema or warmth. No left hand edema or LE edema.  Neurologic:  Alert and  oriented x3;  grossly normal neurologically. Skin:  Warm and dry, intact without significant lesions.  Psych:  Normal mood and affect.   Intake/Output from previous day: 05/18 0701 - 05/19 0700 In: 1254 [P.O.:118; I.V.:175; Blood:961] Out: 1500  Intake/Output this shift: No intake/output data recorded.  Lab Results: Recent Labs    07/23/20 1504 07/24/20 0457 07/25/20 0503  WBC 18.3* 14.3* 11.8*  HGB 7.5* 6.7* 7.8*  HCT 23.5* 21.2* 24.1*  PLT 382 385 346   BMET Recent Labs    07/23/20 1504 07/24/20 0457 07/25/20 0503  NA 134* 133* 131*  K 4.4 4.7 4.0  CL 94* 93* 94*  CO2 _0 GLUCOSE 138* 96 118*  BUN 42* 49* 26*  CREATININE 5.67* 6.55* 4.95*  CALCIUM 8.1* 8.0* 7.9*   LFT Recent Labs    07/23/20 1504  ALBUMIN 2.9*    Assessment: 85 year old female resident of [facility], history of multiple myeloma and  followed by Dr. Delton Coombes, end-stage renal disease on dialysis, dementia, presenting via EMS due to hematemesis and melena, found to have acute on chronic anemia with hemoglobin of 7.1, previously 9.4 several days ago at discharge after hospitalization for hip fracture.  She was discharged on 81 mg of aspirin twice daily.  She has a history of significant for peptic ulcer disease in June 2018, did complete surveillance EGD with documentation of healed ulcers.  At that time felt to be NSAID related.  EGD May 17 with 2 nonbleeding cratered gastric ulcers with no stigmata of bleeding in the prepyloric region of the stomach.  1 nonbleeding cratered duodenal ulcer with stigmata of bleeding/pigmented material found in the duodenal bulb.  Coagulation for bleeding prevention using gold probe unsuccessful, successfully injected with epinephrine for hemostasis.  An additional nonbleeding cratered duodenal ulcer seen.  Hemoglobin declined to 6.7 on 5/18.  She received 1 unit PRBCs.  Hemoglobin 7.8 this morning.  Passed a single tarry stool yesterday, likely secondary to previous bleeding. No further overt GI bleeding since that time.   Plan: 1.  Complete 72 hr PPI infusion post EGD on 5/17.  2.  Continue Carafate. 3.  Continue to monitor H&H and for overt GI bleeding.  Transfuse as needed. 4.  Continue regular diet.   LOS: 3 days    07/25/2020, 11:12 AM   Aliene Altes, [facility]

## 2020-07-25 NOTE — Care Management Important Message (Signed)
Important Message  Patient Details  Name: Carly Wood MRN: [id number] Date of Birth: [birth year]   Medicare Important Message Given:  Yes     Tommy Medal 07/25/2020, 12:09 PM

## 2020-07-25 NOTE — TOC Progression Note (Signed)
Transition of Care [facility]) - Progression Note    Patient Details  Name: Carly Wood MRN: [id number] Date of Birth: 08-27-1935  Transition of Care [facility]) CM/SW Contact  Shade Flood, LCSW Phone Number: 07/25/2020, 2:09 PM  Clinical Narrative:     TOC following. Per MD, pt will likely be stable for dc back to [facility] rehab tomorrow. PT note in recommending SNF. TOC to start insurance auth.   Will follow up tomorrow to further assist with dc planning.  Expected Discharge Plan: [place] Barriers to Discharge: Continued Medical Work up  Expected Discharge Plan and Services Expected Discharge Plan: [place] In-house Referral: Clinical Social Work   Post Acute Care Choice: Resumption of Svcs/PTA Provider Living arrangements for the past 2 months: [place] Expected Discharge Date: 07/25/20                                     Social Determinants of Health (SDOH) Interventions    Readmission Risk Interventions Readmission Risk Prevention Plan 07/25/2020 07/23/2020 07/14/2020  Transportation Screening Complete Complete Complete  Medication Review [employer]) - Complete Complete  PCP or Specialist appointment within 3-5 days of discharge - - Complete  HRI or [place] - Complete Complete  SW Recovery Care/Counseling Consult - - Complete  Palliative Care Screening - Not Applicable Not [place] - Complete Complete  Some recent data might be hidden

## 2020-07-26 ENCOUNTER — Inpatient Hospital Stay (HOSPITAL_COMMUNITY): Payer: Medicare PPO

## 2020-07-26 ENCOUNTER — Telehealth: Payer: Self-pay | Admitting: Gastroenterology

## 2020-07-26 ENCOUNTER — Inpatient Hospital Stay
Admission: RE | Admit: 2020-07-26 | Discharge: 2020-08-20 | Disposition: A | Discharge status: Nursing Home (Includes ICF) | Payer: Medicare PPO | Source: Ambulatory Visit | Attending: Internal Medicine | Admitting: Internal Medicine

## 2020-07-26 DIAGNOSIS — E1169 Type 2 diabetes mellitus with other specified complication: Secondary | ICD-10-CM | POA: Diagnosis not present

## 2020-07-26 DIAGNOSIS — Z23 Encounter for immunization: Secondary | ICD-10-CM | POA: Diagnosis not present

## 2020-07-26 DIAGNOSIS — Z5111 Encounter for antineoplastic chemotherapy: Secondary | ICD-10-CM | POA: Diagnosis present

## 2020-07-26 DIAGNOSIS — D631 Anemia in chronic kidney disease: Secondary | ICD-10-CM | POA: Diagnosis not present

## 2020-07-26 DIAGNOSIS — I700 Atherosclerosis of aorta: Secondary | ICD-10-CM | POA: Diagnosis not present

## 2020-07-26 DIAGNOSIS — I70209 Unspecified atherosclerosis of native arteries of extremities, unspecified extremity: Secondary | ICD-10-CM | POA: Diagnosis not present

## 2020-07-26 DIAGNOSIS — K625 Hemorrhage of anus and rectum: Secondary | ICD-10-CM | POA: Diagnosis not present

## 2020-07-26 DIAGNOSIS — M7989 Other specified soft tissue disorders: Secondary | ICD-10-CM | POA: Diagnosis not present

## 2020-07-26 DIAGNOSIS — F0151 Vascular dementia with behavioral disturbance: Secondary | ICD-10-CM | POA: Diagnosis not present

## 2020-07-26 DIAGNOSIS — K922 Gastrointestinal hemorrhage, unspecified: Secondary | ICD-10-CM | POA: Diagnosis not present

## 2020-07-26 DIAGNOSIS — N250 Renal osteodystrophy: Secondary | ICD-10-CM | POA: Diagnosis not present

## 2020-07-26 DIAGNOSIS — K269 Duodenal ulcer, unspecified as acute or chronic, without hemorrhage or perforation: Secondary | ICD-10-CM | POA: Diagnosis not present

## 2020-07-26 DIAGNOSIS — M8580 Other specified disorders of bone density and structure, unspecified site: Secondary | ICD-10-CM | POA: Diagnosis not present

## 2020-07-26 DIAGNOSIS — K259 Gastric ulcer, unspecified as acute or chronic, without hemorrhage or perforation: Secondary | ICD-10-CM | POA: Diagnosis not present

## 2020-07-26 DIAGNOSIS — D6481 Anemia due to antineoplastic chemotherapy: Secondary | ICD-10-CM | POA: Diagnosis not present

## 2020-07-26 DIAGNOSIS — D649 Anemia, unspecified: Secondary | ICD-10-CM | POA: Diagnosis not present

## 2020-07-26 DIAGNOSIS — S72141D Displaced intertrochanteric fracture of right femur, subsequent encounter for closed fracture with routine healing: Secondary | ICD-10-CM | POA: Diagnosis not present

## 2020-07-26 DIAGNOSIS — N186 End stage renal disease: Secondary | ICD-10-CM | POA: Diagnosis not present

## 2020-07-26 DIAGNOSIS — I120 Hypertensive chronic kidney disease with stage 5 chronic kidney disease or end stage renal disease: Secondary | ICD-10-CM | POA: Diagnosis not present

## 2020-07-26 DIAGNOSIS — Z79811 Long term (current) use of aromatase inhibitors: Secondary | ICD-10-CM | POA: Diagnosis not present

## 2020-07-26 DIAGNOSIS — E1122 Type 2 diabetes mellitus with diabetic chronic kidney disease: Secondary | ICD-10-CM | POA: Diagnosis not present

## 2020-07-26 DIAGNOSIS — C50411 Malignant neoplasm of upper-outer quadrant of right female breast: Secondary | ICD-10-CM | POA: Diagnosis not present

## 2020-07-26 DIAGNOSIS — S72001A Fracture of unspecified part of neck of right femur, initial encounter for closed fracture: Secondary | ICD-10-CM | POA: Diagnosis not present

## 2020-07-26 DIAGNOSIS — C9001 Multiple myeloma in remission: Secondary | ICD-10-CM | POA: Diagnosis not present

## 2020-07-26 DIAGNOSIS — D259 Leiomyoma of uterus, unspecified: Secondary | ICD-10-CM | POA: Diagnosis not present

## 2020-07-26 DIAGNOSIS — S72001S Fracture of unspecified part of neck of right femur, sequela: Secondary | ICD-10-CM | POA: Diagnosis not present

## 2020-07-26 DIAGNOSIS — Z170 Estrogen receptor positive status [ER+]: Secondary | ICD-10-CM | POA: Diagnosis not present

## 2020-07-26 DIAGNOSIS — E1129 Type 2 diabetes mellitus with other diabetic kidney complication: Secondary | ICD-10-CM | POA: Diagnosis not present

## 2020-07-26 DIAGNOSIS — C9000 Multiple myeloma not having achieved remission: Secondary | ICD-10-CM | POA: Diagnosis present

## 2020-07-26 DIAGNOSIS — Z992 Dependence on renal dialysis: Secondary | ICD-10-CM | POA: Diagnosis not present

## 2020-07-26 DIAGNOSIS — C50911 Malignant neoplasm of unspecified site of right female breast: Secondary | ICD-10-CM | POA: Diagnosis not present

## 2020-07-26 DIAGNOSIS — K219 Gastro-esophageal reflux disease without esophagitis: Secondary | ICD-10-CM | POA: Diagnosis not present

## 2020-07-26 DIAGNOSIS — E43 Unspecified severe protein-calorie malnutrition: Secondary | ICD-10-CM | POA: Diagnosis not present

## 2020-07-26 DIAGNOSIS — F0390 Unspecified dementia without behavioral disturbance: Secondary | ICD-10-CM | POA: Diagnosis not present

## 2020-07-26 DIAGNOSIS — K254 Chronic or unspecified gastric ulcer with hemorrhage: Secondary | ICD-10-CM | POA: Diagnosis not present

## 2020-07-26 DIAGNOSIS — D62 Acute posthemorrhagic anemia: Secondary | ICD-10-CM | POA: Diagnosis not present

## 2020-07-26 LAB — PHOSPHORUS: Phosphorus: 3.3 mg/dL (ref 2.5–4.6)

## 2020-07-26 LAB — CBC
HCT: 26.8 % — ABNORMAL LOW (ref 36.0–46.0)
Hemoglobin: 8.5 g/dL — ABNORMAL LOW (ref 12.0–15.0)
MCH: 31.4 pg (ref 26.0–34.0)
MCHC: 31.7 g/dL (ref 30.0–36.0)
MCV: 98.9 fL (ref 80.0–100.0)
Platelets: 430 10*3/uL — ABNORMAL HIGH (ref 150–400)
RBC: 2.71 MIL/uL — ABNORMAL LOW (ref 3.87–5.11)
RDW: 17.7 % — ABNORMAL HIGH (ref 11.5–15.5)
WBC: 10.4 10*3/uL (ref 4.0–10.5)
nRBC: 0.9 % — ABNORMAL HIGH (ref 0.0–0.2)

## 2020-07-26 LAB — BASIC METABOLIC PANEL WITH GFR
Anion gap: 13 (ref 5–15)
BUN: 40 mg/dL — ABNORMAL HIGH (ref 8–23)
CO2: 26 mmol/L (ref 22–32)
Calcium: 8.5 mg/dL — ABNORMAL LOW (ref 8.9–10.3)
Chloride: 94 mmol/L — ABNORMAL LOW (ref 98–111)
Creatinine, Ser: 6.9 mg/dL — ABNORMAL HIGH (ref 0.44–1.00)
GFR, Estimated: 6 mL/min — ABNORMAL LOW
Glucose, Bld: 111 mg/dL — ABNORMAL HIGH (ref 70–99)
Potassium: 4.2 mmol/L (ref 3.5–5.1)
Sodium: 133 mmol/L — ABNORMAL LOW (ref 135–145)

## 2020-07-26 LAB — GLUCOSE, CAPILLARY
Glucose-Capillary: 112 mg/dL — ABNORMAL HIGH (ref 70–99)
Glucose-Capillary: 98 mg/dL (ref 70–99)
Glucose-Capillary: 136 mg/dL — ABNORMAL HIGH (ref 70–99)
Glucose-Capillary: 130 mg/dL — ABNORMAL HIGH (ref 70–99)

## 2020-07-26 LAB — MAGNESIUM: Magnesium: 2.2 mg/dL (ref 1.7–2.4)

## 2020-07-26 MED ORDER — SUCRALFATE 1 GM/10ML PO SUSP: 1.0000 g | Freq: Three times a day (TID) | ORAL | 0 refills | Status: DC

## 2020-07-26 MED ORDER — PANTOPRAZOLE SODIUM 40 MG PO TBEC: 40.0000 mg | DELAYED_RELEASE_TABLET | Freq: Two times a day (BID) | ORAL | 2 refills | Status: DC

## 2020-07-26 MED ORDER — CLONIDINE HCL 0.1 MG PO TABS: 0.1000 mg | ORAL_TABLET | Freq: Every day | ORAL | 1 refills | Status: DC

## 2020-07-26 MED ORDER — INSULIN ASPART 100 UNIT/ML FLEXPEN: 0.0000 [IU] | PEN_INJECTOR | Freq: Three times a day (TID) | SUBCUTANEOUS | 11 refills | Status: DC

## 2020-07-26 MED ORDER — ONDANSETRON HCL 4 MG PO TABS: 4.0000 mg | ORAL_TABLET | Freq: Four times a day (QID) | ORAL | 0 refills | Status: DC | PRN

## 2020-07-26 MED ORDER — PANTOPRAZOLE SODIUM 40 MG PO TBEC
40.0000 mg | DELAYED_RELEASE_TABLET | Freq: Two times a day (BID) | ORAL | Status: DC
  Administered 2020-07-26: 40 mg via ORAL
  Filled 2020-07-26: qty 1

## 2020-07-26 MED ORDER — ATENOLOL 25 MG PO TABS: 25.0000 mg | ORAL_TABLET | Freq: Every day | ORAL | 11 refills | Status: DC

## 2020-07-26 NOTE — TOC Transition Note (Signed)
Transition of Care [facility]) - CM/SW Discharge Note   Patient Details  Name: Carly Wood MRN: [id number] Date of Birth: 06/29/1935  Transition of Care [facility]) CM/SW Contact:  Shade Flood, LCSW Phone Number: 07/26/2020, 3:34 PM   Clinical Narrative:     Pt medically stable for dc today per MD. Insurance has authorized return to rehab at [facility]. Spoke with Marianna Fuss and they are ready for pt today. DC clinical sent electronically. RN to call report.   There are no other TOC needs for dc.   Final next level of care: Skilled Nursing Facility Barriers to Discharge: Barriers Resolved   Patient Goals and CMS Choice Patient states their goals for this hospitalization and ongoing recovery are:: return to rehab      Discharge Placement                       Discharge Plan and Services In-house Referral: Clinical Social Work   Post Acute Care Choice: Resumption of Svcs/PTA Provider                               Social Determinants of Health (SDOH) Interventions     Readmission Risk Interventions Readmission Risk Prevention Plan 07/25/2020 07/23/2020 07/14/2020  Transportation Screening Complete Complete Complete  Medication Review Press photographer) - Complete Complete  PCP or Specialist appointment within 3-5 days of discharge - - Complete  HRI or [place] - Complete Complete  SW Recovery Care/Counseling Consult - - Complete  [place] - Complete Complete  Some recent data might be hidden

## 2020-07-26 NOTE — Discharge Summary (Signed)
Carly Wood, is a 85 y.o. female  DOB 23-May-1935  MRN [id number].  Admission date:  07/22/2020  Admitting Physician  Murlean Iba, MD  Discharge Date:  07/26/2020   Primary MD  Gerlene Fee, NP  Recommendations for primary care physician for things to follow:   1)Avoid ibuprofen/Advil/Aleve/Motrin/Goody Powders/Naproxen/BC powders/Meloxicam/Diclofenac/Indomethacin and other Nonsteroidal anti-inflammatory medications as these will make you more likely to bleed and can cause stomach ulcers, can also cause Kidney problems.   2)Repeat BMP and CBC on Monday, 07/29/2020  3) follow-up with gastroenterologist Dr. Laural Golden for repeat EGD to document healing of gastric and duodenal ulcers in about 8 weeks  4)-NovoLog insulin sliding scale ---insulin aspart (novoLOG) injection 0-10 Units 0-10 Units Subcutaneous, 3 times daily with meals CBG < 70: Implement Hypoglycemia Standing Orders and refer to Hypoglycemia Standing Orders sidebar report  CBG 70 - 120: 0 unit CBG 121 - 150: 0 unit  CBG 151 - 200: 1 unit CBG 201 - 250: 2 units CBG 251 - 300: 4 units CBG 301 - 350: 6 units  CBG 351 - 400: 8 units  CBG > 400: 10 units  5) Carafate and Protonix as ordered for gastric and duodenal ulcers  6) please call if dark stools or bleeding concerns  7) please continue hemodialysis on Mondays Wednesdays and Fridays   Admission Diagnosis  Rectal bleeding [K62.5] Acute upper GI bleed [K92.2] Severe anemia [D64.9]   Discharge Diagnosis  Rectal bleeding [K62.5] Acute upper GI bleed [K92.2] Severe anemia [D64.9]    Principal Problem:   Acute upper GI bleed Active Problems:   Multiple gastric ulcers   Acute blood loss anemia   Diabetes mellitus with ESRD (end-stage renal disease) ([place])   Anemia due to end stage renal disease (HCC)   ESRD (end stage renal disease) (HCC)   Rectal bleeding   Duodenal ulcer    Aromatase inhibitor use   Lymphedema of arm   Dysphagia   GERD (gastroesophageal reflux disease)   Iron deficiency anemia   Multiple myeloma (HCC)   Hypertension due to end stage renal disease caused by type 2 diabetes mellitus, on dialysis ([place])   Herpes   Vascular dementia with behavior disturbance (HCC)   Dependence on renal dialysis ([place])   Aortic atherosclerosis (HCC)   Atherosclerotic peripheral vascular disease (HCC)   Hyperlipidemia associated with type 2 diabetes mellitus ([place])   Closed right hip fracture (HCC)   Major depression, recurrent, chronic ([place])      Past Medical History:  Diagnosis Date  . Anemia     chronic macrocytic anemia  . Anxiety   . Chronic kidney disease   . Chronic renal disease, stage 4, severely decreased glomerular filtration rate (GFR) between 15-29 mL/min/1.73 square meter (HCC) 08/22/2015  . Complication of anesthesia    delirious after Breast Surgery  . Dementia ([place])    mild  . Depression   . Diabetes mellitus with ESRD (end-stage renal disease) ([place])    type II  . Dyspnea  with activity  . GERD (gastroesophageal reflux disease)   . Glaucoma   . Hypertension   . Pneumonia   . Stage 1 infiltrating ductal carcinoma of right female breast ([place]) 08/21/2015   ER+ PR+ HER 2 neu + (3+) T1cN0     Past Surgical History:  Procedure Laterality Date  . A/V FISTULAGRAM Right 07/05/2020   Procedure: A/V Fistulagram;  Surgeon: Elam Dutch, MD;  Location: [place] [facility];  Service: Cardiovascular;  Laterality: Right;  . AV FISTULA PLACEMENT Left 11/22/2017   Procedure: ARTERIOVENOUS (AV) FISTULA CREATION LEFT ARM;  Surgeon: Elam Dutch, MD;  Location: [facility] OR;  Service: Vascular;  Laterality: Left;  . AV FISTULA PLACEMENT Right 04/04/2020   Procedure: RIGHT ARM ARTERIOVENOUS FISTULA CREATION;  Surgeon: Rosetta Posner, MD;  [facility];  Service: Vascular;  Laterality: Right;  . BIOPSY  08/07/2016   Procedure: BIOPSY;  Surgeon: Daneil Dolin, MD;  [facility];  Service: Endoscopy;;  gastric ulcer biopsy  . COLONOSCOPY    . ESOPHAGOGASTRODUODENOSCOPY N/A 08/07/2016   LA Grade A esophagitis s/p dilation, small hiatal hernia, multiple gastric ulcers and erosions, duodenal erosions s/p biopsy. Negative H.pylori   . ESOPHAGOGASTRODUODENOSCOPY N/A 11/27/2016   normal esophagus, previously noted gastric ulcers completely healed, normal duodenum.   . ESOPHAGOGASTRODUODENOSCOPY (EGD) WITH PROPOFOL N/A 07/23/2020   Procedure: ESOPHAGOGASTRODUODENOSCOPY (EGD) WITH PROPOFOL;  Surgeon: Rogene Houston, MD;  [facility];  Service: Endoscopy;  Laterality: N/A;  . FISTULA SUPERFICIALIZATION Left 02/14/2018   Procedure: FISTULA SUPERFICIALIZATION LEFT ARM;  Surgeon: Angelia Mould, MD;  Location: [place];  Service: Vascular;  Laterality: Left;  . FRACTURE SURGERY Right    ankle  . HOT HEMOSTASIS  07/23/2020   Procedure: HOT HEMOSTASIS (ARGON PLASMA COAGULATION/BICAP);  Surgeon: Rogene Houston, MD;  [facility];  Service: Endoscopy;;  . INTRAMEDULLARY (IM) NAIL INTERTROCHANTERIC Right 07/12/2020   Procedure: INTRAMEDULLARY (IM) NAIL INTERTROCHANTRIC;  Surgeon: Mordecai Rasmussen, MD;  Location: [facility];  Service: Orthopedics;  Laterality: Right;  . MALONEY DILATION N/A 08/07/2016   Procedure: Venia Minks DILATION;  Surgeon: Daneil Dolin, MD;  [facility];  Service: Endoscopy;  Laterality: N/A;  . MASTECTOMY, PARTIAL Right   . multiple myeloma    . PERIPHERAL VASCULAR BALLOON ANGIOPLASTY Left 07/13/2019   Procedure: PERIPHERAL VASCULAR BALLOON ANGIOPLASTY;  Surgeon: Marty Heck, MD;  Location: [place] [facility];  Service: Cardiovascular;  Laterality: Left;  arm fistulogram  . PERIPHERAL VASCULAR BALLOON ANGIOPLASTY Right 05/22/2020   Procedure: PERIPHERAL VASCULAR BALLOON ANGIOPLASTY;  Surgeon: Cherre Robins, MD;  Location: [place] [facility];  Service: Cardiovascular;  Laterality: Right;   arm fistula  . PERIPHERAL VASCULAR BALLOON ANGIOPLASTY Right 07/05/2020   Procedure: PERIPHERAL VASCULAR BALLOON ANGIOPLASTY;  Surgeon: Elam Dutch, MD;  Location: [place] [facility];  Service: Cardiovascular;  Laterality: Right;  arm fistula  . PORT-A-CATH REMOVAL Left 11/22/2017   Procedure: REMOVAL PORT-A-CATH LEFT CHEST;  Surgeon: Elam Dutch, MD;  [facility] OR;  Service: Vascular;  Laterality: Left;  . RETINAL DETACHMENT SURGERY Right   . SCLEROTHERAPY  07/23/2020   Procedure: SCLEROTHERAPY;  Surgeon: Rogene Houston, MD;  [facility];  Service: Endoscopy;;     HPI  from the history and physical done on the day of admission:    Chief Complaint: rectal bleeding, black stools AND coughing up blood  HPI: Carly Wood is a  85 y.o. female with medical history significant for recent hip fracture repair discharged to [facility] on aspirin 81 mg twice daily for DVT prophylaxis.  Patient has end-stage renal disease hemodialysis Monday Wednesday Friday.  Patient has type 2 diabetes mellitus, depression, dementia, hypertension, glaucoma, breast cancer.  She was sent to the emergency department from EMS from [facility] with symptoms of hematemesis, bright red blood in stool and black stools.  Was noted by the nursing staff this morning at the [facility].  She denies symptoms of chest pain and shortness of breath.  She has had a mild productive cough for the past several days whitish sputum production but no fever or chills.  Hemoglobin was 9.4 on 07/17/2020 and dropped down to 7.1 today.  Patient was noted to be guaiac positive.  SARS 2 coronavirus test negative.  Patient was typed and crossed and 1 unit of packed red blood cells ordered.  Patient was started on IV Protonix infusion and GI was consulted.  Nephrology was also consulted for hemodialysis treatment which is scheduled for today.  The patient denies having abdominal pain.  She mostly complains of peripheral  neuropathy pain.  Patient does have a history of multiple gastric ulcers.       Hospital Course:    Brief Admission History:  85 y.o.femalewith medical history significantforrecent hip fracture repair discharged to [facility] on aspirin 81 mg twice daily for DVT prophylaxis. Patient has end-stage renal disease hemodialysis Monday Wednesday Friday. Patient has type 2 diabetes mellitus, depression, dementia, hypertension, glaucoma, breast cancer. She was sent to the emergency department from EMS from [facility] with symptoms of hematemesis, bright red blood in stool and black stools.   - A/p 1)Acute GI bleeding/PUD - History of multiple Gastric ulcers- EGD on 07/23/2020 with gastric and duodenal ulcers,  -Treated with IV Protonix for 72 hours post EGD advised by GI service thru 07/26/20 Also had bright red rectal bleeding and clots initially -No further BRBPR, patient did have dark stool on 07/24/2020 -Transfusion as below #2,  -Hgb stable at 8.5 posttransfusion - 2) acute on chronic anemia due to  ABLA/GI bleed -At baseline Patient has anemia of ESRD, now with acute GI bleed/ABLA superimposed on chronic anemia --Hgb stable at 8.5 posttransfusion -Procrit/Epogen per nephrology service  3)End-stage renal disease on hemodialysis- had HD on 07/26/2020, nephrology input appreciated  4)Status post right hip fracture and repair-staples removed on 07/26/2020  5)DM2-- with renal and neurological complications -continue basal lantus insulin along with Use Novolog/Humalog Sliding scale insulin with Accu-Cheks/Fingersticks as ordered  6)HTN-BP improved with PRBC transfusion and IV fluids -Blood pressure medications adjusted due to soft BP   7)Vascular dementia -baseline cognitive and memory deficits, delirium precautions.  8)History of breast cancer-patient has been maintained on aromatase inhibitor therapy.   DVT prophylaxis:SCD Code Status:Full Family  Communication: Disposition Plan:return to SNF  Consults called:GI, Nephrology  Dispo: The patient is from: SNF  Anticipated d/c is to: SNF in 1 day    Consultants:   GI   Procedures:   EGD  07/23/20   Discharge Condition: stable  Follow UP--- nephrologist and GI service   Diet and Activity recommendation:  As advised  Discharge Instructions    Discharge Instructions    Call MD for:  difficulty breathing, headache or visual disturbances   Complete by: As directed    Call MD for:  persistant dizziness or light-headedness   Complete by: As directed    Call MD for:  persistant nausea and vomiting   Complete by: As directed    Call MD for:  severe uncontrolled pain   Complete by: As directed    Call MD for:  temperature >100.4   Complete by: As directed    Diet - low sodium heart healthy   Complete by: As directed    Diet Carb Modified   Complete by: As directed    Discharge instructions   Complete by: As directed    1)Avoid ibuprofen/Advil/Aleve/Motrin/Goody Powders/Naproxen/BC powders/Meloxicam/Diclofenac/Indomethacin and other Nonsteroidal anti-inflammatory medications as these will make you more likely to bleed and can cause stomach ulcers, can also cause Kidney problems.   2)Repeat BMP and CBC on Monday, 07/29/2020  3) follow-up with gastroenterologist Dr. Laural Golden for repeat EGD to document healing of gastric and duodenal ulcers in about 8 weeks  4)-NovoLog insulin sliding scale ---insulin aspart (novoLOG) injection 0-10 Units 0-10 Units Subcutaneous, 3 times daily with meals CBG < 70: Implement Hypoglycemia Standing Orders and refer to Hypoglycemia Standing Orders sidebar report  CBG 70 - 120: 0 unit CBG 121 - 150: 0 unit  CBG 151 - 200: 1 unit CBG 201 - 250: 2 units CBG 251 - 300: 4 units CBG 301 - 350: 6 units  CBG 351 - 400: 8 units  CBG > 400: 10 units  5) Carafate and Protonix as ordered for gastric and duodenal ulcers  6) please  call if dark stools or bleeding concerns  7) please continue hemodialysis on Mondays Wednesdays and Fridays   Discharge wound care:   Complete by: As directed    As advised   Increase activity slowly   Complete by: As directed        Discharge Medications     Allergies as of 07/26/2020      Reactions   Ace Inhibitors Other (See Comments), Cough   Tongue swell , ie angioedema   Angiotensin Receptor Blockers    Angioedema with ACE-I   Penicillins Other (See Comments)   Unsure of reaction Has patient had a PCN reaction causing immediate rash, facial/tongue/throat swelling, SOB or lightheadedness with hypotension: Unknown Has patient had a PCN reaction causing severe rash involving mucus membranes or skin necrosis: Unknown Has patient had a PCN reaction that required hospitalization: No Has patient had a PCN reaction occurring within the last 10 years: Unknown If all of the above answers are "NO", then may proceed with Cephalosporin use.      Medication List    STOP taking these medications   amLODipine 10 MG tablet Commonly known as: NORVASC   aspirin 81 MG EC tablet   insulin lispro 100 UNIT/ML KwikPen Commonly known as: HUMALOG     TAKE these medications   acetaminophen 650 MG CR tablet Commonly known as: TYLENOL Take 650 mg by mouth every 6 (six) hours.   acyclovir 200 MG capsule Commonly known as: ZOVIRAX Take 200 mg by mouth daily.   anastrozole 1 MG tablet Commonly known as: ARIMIDEX TAKE 1 TABLET BY MOUTH DAILY   atenolol 25 MG tablet Commonly known as: Tenormin Take 1 tablet (25 mg total) by mouth daily.   atorvastatin 10 MG tablet Commonly known as: LIPITOR Take 10 mg by mouth at bedtime.   Calcium Acetate 667 MG Tabs Take 667 mg by mouth daily. Take 1 tablet w/ snacks daily   Calcium Acetate 667 MG Tabs Take 1,334 mg by mouth in the morning and at bedtime. 2 tabs with meals   calcium-vitamin D 500-200 MG-UNIT  tablet Commonly known as: OSCAL  WITH D Take 1 tablet by mouth daily. At 8 am   cloNIDine 0.1 MG tablet Commonly known as: CATAPRES Take 1 tablet (0.1 mg total) by mouth at bedtime. Hold for SBP < 110 bpm What changed:   medication strength  how much to take  when to take this  additional instructions   insulin aspart 100 UNIT/ML FlexPen Commonly known as: NOVOLOG Inject 0-10 Units into the skin 3 (three) times daily with meals. insulin aspart (novoLOG) injection 0-10 Units 0-10 Units Subcutaneous, 3 times daily with meals CBG < 70: Implement Hypoglycemia Standing Orders and refer to Hypoglycemia Standing Orders sidebar report  CBG 70 - 120: 0 unit CBG 121 - 150: 0 unit  CBG 151 - 200: 1 unit CBG 201 - 250: 2 units CBG 251 - 300: 4 units CBG 301 - 350: 6 units  CBG 351 - 400: 8 units  CBG > 400: 10 units   Lantus SoloStar 100 UNIT/ML Solostar Pen Generic drug: insulin glargine Inject 10 Units into the skin at bedtime.   melatonin 3 MG Tabs tablet Take 6 mg by mouth at bedtime. For Sleep   multivitamin capsule Take 1 capsule by mouth daily.   NON FORMULARY Diet- renal carb modified 1200cc fluid restriction   ondansetron 4 MG tablet Commonly known as: ZOFRAN Take 1 tablet (4 mg total) by mouth every 6 (six) hours as needed for nausea.   pantoprazole 40 MG tablet Commonly known as: PROTONIX Take 1 tablet (40 mg total) by mouth 2 (two) times daily before a meal.   sertraline 50 MG tablet Commonly known as: ZOLOFT Take 50 mg by mouth daily.   sucralfate 1 GM/10ML suspension Commonly known as: CARAFATE Take 10 mLs (1 g total) by mouth 4 (four) times daily -  with meals and at bedtime.            Discharge Care Instructions  (From admission, onward)         Start     Ordered   07/26/20 0000  Discharge wound care:       Comments: As advised   07/26/20 1520          Major procedures and Radiology Reports - PLEASE review detailed and final reports for all details, in brief -  DG Chest 1  View  Result Date: 07/11/2020 CLINICAL DATA:  Pain following fall EXAM: CHEST  1 VIEW COMPARISON:  November 22, 2017 FINDINGS: Central catheter tip is at the cavoatrial junction. No pneumothorax. There is slight left base atelectasis. Lungs elsewhere clear. Heart is upper normal in size with pulmonary vascularity normal. There is aortic atherosclerosis. No adenopathy. No bone lesions. There are surgical clips in the right axilla. IMPRESSION: Central catheter tip at cavoatrial junction. No pneumothorax. Mild left base atelectasis. No edema or airspace opacity. Stable cardiac silhouette. Aortic Atherosclerosis (ICD10-I70.0). Electronically Signed   By: Lowella Grip III M.D.   On: 07/11/2020 15:14   DG Pelvis 1-2 Views  Result Date: 07/26/2020 CLINICAL DATA:  Follow-up ORIF of proximal right femoral fracture. EXAM: PELVIS - 1 VIEW COMPARISON:  07/12/2020 FINDINGS: Pelvic ring is intact. Multiple calcified uterine fibroids are seen. No acute fracture is noted. Prior proximal right femoral fracture with fixation is noted stable in appearance from the prior exam. No other focal abnormality is noted. IMPRESSION: Stable ORIF of proximal right femoral fracture. No acute abnormality noted. Electronically Signed   By: Inez Catalina M.D.   On:  07/26/2020 15:10   DG Pelvis 1-2 Views  Result Date: 07/12/2020 CLINICAL DATA:  The patient suffered a right hip fracture in a fall 07/11/2020. Initial encounter. EXAM: PELVIS - 1-2 VIEW COMPARISON:  Plain films right hip 07/11/2020. FINDINGS: New intramedullary nail and hip screw are in place for fixation of a right hip fracture. Hardware is intact. The intramedullary nail is incompletely imaged. Position and alignment are near anatomic. No acute abnormality. IMPRESSION: Status post fixation right hip fracture.  No acute finding. Electronically Signed   By: Inge Rise M.D.   On: 07/12/2020 15:07   DG Knee 1-2 Views Right  Result Date: 07/11/2020 CLINICAL DATA:   Recent fall with right knee pain, initial encounter EXAM: RIGHT KNEE - 2 VIEW COMPARISON:  None. FINDINGS: No acute fracture or dislocation is noted. Diffuse vascular calcifications are seen. No soft tissue abnormality is noted. IMPRESSION: No acute abnormality noted. Electronically Signed   By: Inez Catalina M.D.   On: 07/11/2020 15:13   PERIPHERAL VASCULAR CATHETERIZATION  Result Date: 07/05/2020 Procedure: Right arm fistulogram, angioplasty right subclavian innominate vein superior vena cava Preoperative diagnosis: Venous hypertension right arm Postoperative diagnosis: Same Anesthesia: Local Operative findings: 1.  80% stenosis right subclavian and innominate and superior vena cava angioplastied up to a 10 mm balloon with complete elastic recoil and no significant improvement Operative details: After obtaining informed consent, the patient was taken the PV lab.  The patient was placed in supine position the angio table.  Patient's right upper extremities prepped and draped in usual sterile fashion.  Local anesthesia was infiltrated over the right upper arm AV fistula.  Micropuncture needle was used to cannulate the right arm AV fistula under ultrasound guidance.  Micropuncture wire was advanced into the fistula.  Micropuncture sheath was placed over this.  Right arm fistulogram and central venogram was then performed through the sheath.  The superior vena cava innominate vein and subclavian veins on the right side are all patent.  However there were multiple segments areas of narrowing up to 80%.  There are abundant collaterals around the shoulder.  The remainder of the AV fistula is widely patent.  Compression on the outflow to reflux contrast across the arterial anastomosis showed this was widely patent as well. At this point the sheath was accessed with an 035 versa core wire and advanced across the central vein stenosis into the right atrium.  This was assisted with a KMP catheter.  A 7 French sheath was  then placed over the wire.  Patient was given 3000 units of intravenous heparin.  I then angioplastied 3 discrete lesions in the right subclavian right innominate and superior vena cava utilizing multiple inflations of a 10 x 40 balloon.  Due to the recoil nature of the vein the balloon Sliding forward.  Therefore I swapped this out for a 1080 balloon.  This was then inflated to 8 atm for 1 minute.  Completion angiogram really showed no significant improvement and complete Recoil of the narrowing.  A similar procedure had been performed approximately 30 days ago.  In light of this I felt my options were to either stent her central vein potentially encroaching upon the left innominate system and jailing the right internal jugular vein or abandoning this in favor of placing a thigh graft in the near future.  I opted not to stent the central vein segment. At this point the guidewire was removed the sheath was removed and the fistula opening closed with a single figure-of-eight  Monocryl stitch. Operative management: The patient has had previous access procedures which failed in the left arm.  She now has venous hypertension and multiple areas of narrowing in the right innominate superior vena cava on the right side.  I do not believe we are going to obtain significant durability of her right arm fistula even if we stent this segment.  We will schedule the patient for outpatient follow-up for consideration of a thigh graft. Ruta Hinds, MD [facility] Office: [phone]   DG CHEST PORT 1 VIEW  Result Date: 07/23/2020 CLINICAL DATA:  Dyspnea, leukocytosis. EXAM: PORTABLE CHEST 1 VIEW COMPARISON:  Jul 14, 2020. FINDINGS: The heart size and mediastinal contours are within normal limits. Both lungs are clear. Stable position of left internal jugular dialysis catheter. No pneumothorax or pleural effusion is noted. The visualized skeletal structures are unremarkable. IMPRESSION: No  active disease. Aortic Atherosclerosis (ICD10-I70.0). Electronically Signed   By: Marijo Conception M.D.   On: 07/23/2020 09:25   DG Chest Port 1 View  Result Date: 07/14/2020 CLINICAL DATA:  Short of breath EXAM: PORTABLE CHEST 1 VIEW COMPARISON:  Prior chest x-ray 07/11/2020 FINDINGS: Left IJ approach tunneled hemodialysis catheter. Catheter tip overlies the superior cavoatrial junction. Stable cardiac and mediastinal contours. Atherosclerotic calcifications again visualized in the transverse aorta. Increased pulmonary vascular congestion. Chronic elevation of the left hemidiaphragm with left basilar atelectasis. No pneumothorax. No acute osseous abnormality. IMPRESSION: 1. Increased pulmonary vascular congestion suggests either mild CHF or fluid overload. 2. Stable and well-positioned left IJ tunneled hemodialysis catheter. Electronically Signed   By: Jacqulynn Cadet M.D.   On: 07/14/2020 11:08   DG HIP OPERATIVE UNILAT WITH PELVIS RIGHT  Result Date: 07/12/2020 CLINICAL DATA:  Hip fracture EXAM: OPERATIVE right HIP (WITH PELVIS IF PERFORMED) 14 VIEWS TECHNIQUE: Fluoroscopic spot image(s) were submitted for interpretation post-operatively. COMPARISON:  07/11/2020 FINDINGS: Fourteen low resolution intraoperative spot views of the right hip. Total fluoroscopy time was 1 minutes 50 seconds. The images demonstrate basicervical/intertrochanteric fracture with subsequent intramedullary rodding and distal screw fixation with anatomic alignment. IMPRESSION: Intraoperative fluoroscopic assistance provided during surgical fixation of proximal right femur fracture. Electronically Signed   By: Donavan Foil M.D.   On: 07/12/2020 16:02   DG Hip Unilat  With Pelvis 2-3 Views Right  Result Date: 07/11/2020 CLINICAL DATA:  Fall, right hip deformity EXAM: DG HIP (WITH OR WITHOUT PELVIS) 2-3V RIGHT COMPARISON:  None. FINDINGS: Single view radiograph pelvis and two view radiograph of the right hip demonstrates an acute,  mildly impacted, angulated basicervical right femoral neck fracture with mild posterior and moderate varus angulation. The femoral head is still seated within the right acetabulum. Mild-to-moderate degenerative hip arthritis is present with joint space narrowing noted bilaterally. The pelvis and left hip are intact. Multiple involuted fibroids are seen within the pelvis. Advanced vascular calcifications are seen within the pelvis and medial thighs. IMPRESSION: Acute, impacted, angulated right basicervical femoral neck fracture. Electronically Signed   By: Fidela Salisbury MD   On: 07/11/2020 15:14   DG FEMUR, MIN 2 VIEWS RIGHT  Result Date: 07/26/2020 CLINICAL DATA:  Follow-up right femoral fracture EXAM: RIGHT FEMUR 2 VIEWS COMPARISON:  07/12/2020 FINDINGS: Proximal femoral neck fracture is again identified with fixation rod and fixation screws. No new fracture is seen. No soft tissue abnormality is noted. IMPRESSION: Status post ORIF of proximal right femoral fracture. Electronically Signed   By: Inez Catalina M.D.   On: 07/26/2020 15:10  DG FEMUR PORT, MIN 2 VIEWS RIGHT  Result Date: 07/12/2020 CLINICAL DATA:  Postop femoral ORIF. EXAM: RIGHT FEMUR PORTABLE 2 VIEW COMPARISON:  Intraoperative views earlier the same date. Radiographs 07/11/2020. FINDINGS: Status post right femoral dynamic screw and intramedullary fixation of the comminuted intertrochanteric femur fracture. There is near anatomic reduction of the fracture. The hardware is well positioned. Lateral skin staples are present superior to the greater trochanter, and there is soft tissue emphysema laterally in the proximal to mid right thigh. No complications identified. Calcified uterine fibroids and femoral atherosclerosis are noted. IMPRESSION: Near anatomic reduction of intertrochanteric right femur fracture post ORIF. Electronically Signed   By: Richardean Sale M.D.   On: 07/12/2020 15:06    Micro Results   Recent Results (from the past 240  hour(s))  Resp Panel by RT-PCR (Flu A&B, Covid) Nasopharyngeal Swab     Status: None   Collection Time: 07/22/20  8:22 AM   Specimen: Nasopharyngeal Swab; Nasopharyngeal(NP) swabs in vial transport medium  Result Value Ref Range Status   SARS Coronavirus 2 by RT PCR NEGATIVE NEGATIVE Final    Comment: (NOTE) SARS-CoV-2 target nucleic acids are NOT DETECTED.  The SARS-CoV-2 RNA is generally detectable in upper respiratory specimens during the acute phase of infection. The lowest concentration of SARS-CoV-2 viral copies this assay can detect is 138 copies/mL. A negative result does not preclude SARS-Cov-2 infection and should not be used as the sole basis for treatment or other patient management decisions. A negative result may occur with  improper specimen collection/handling, submission of specimen other than nasopharyngeal swab, presence of viral mutation(s) within the areas targeted by this assay, and inadequate number of viral copies(<138 copies/mL). A negative result must be combined with clinical observations, patient history, and epidemiological information. The expected result is Negative.  Fact Sheet for Patients:  EntrepreneurPulse.com.au  Fact Sheet for Healthcare Providers:  IncredibleEmployment.be  This test is no t yet approved or cleared by the [place] FDA and  has been authorized for detection and/or diagnosis of SARS-CoV-2 by FDA under an Emergency Use Authorization (EUA). This EUA will remain  in effect (meaning this test can be used) for the duration of the COVID-19 declaration under Section 564(b)(1) of the Act, 21 U.S.C.section 360bbb-3(b)(1), unless the authorization is terminated  or revoked sooner.       Influenza A by PCR NEGATIVE NEGATIVE Final   Influenza B by PCR NEGATIVE NEGATIVE Final    Comment: (NOTE) The Xpert Xpress SARS-CoV-2/FLU/RSV plus assay is intended as an aid in the diagnosis of influenza from  Nasopharyngeal swab specimens and should not be used as a sole basis for treatment. Nasal washings and aspirates are unacceptable for Xpert Xpress SARS-CoV-2/FLU/RSV testing.  Fact Sheet for Patients: EntrepreneurPulse.com.au  Fact Sheet for Healthcare Providers: IncredibleEmployment.be  This test is not yet approved or cleared by the [place] FDA and has been authorized for detection and/or diagnosis of SARS-CoV-2 by FDA under an Emergency Use Authorization (EUA). This EUA will remain in effect (meaning this test can be used) for the duration of the COVID-19 declaration under Section 564(b)(1) of the Act, 21 U.S.C. section 360bbb-3(b)(1), unless the authorization is terminated or revoked.  Performed at [facility], [address], [place], [place] [address]   MRSA PCR Screening     Status: None   Collection Time: 07/22/20 11:55 AM   Specimen: Nasopharyngeal  Result Value Ref Range Status   MRSA by PCR NEGATIVE NEGATIVE Final    Comment:  The GeneXpert MRSA Assay (FDA approved for NASAL specimens only), is one component of a comprehensive MRSA colonization surveillance program. It is not intended to diagnose MRSA infection nor to guide or monitor treatment for MRSA infections. Performed at [facility], [address] [name], [place] [address]        Today   Subjective    Carly Wood today has no new complaints  No fever  Or chills   No Nausea, Vomiting or Diarrhea      Patient has been seen and examined prior to discharge   Objective   Blood pressure (!) 108/55, pulse (!) 109, temperature 99.2 F (37.3 C), temperature source Oral, resp. rate 18, height _0  (1.6 m), weight 63.6 kg, SpO2 100 %.   Intake/Output Summary (Last 24 hours) at 07/26/2020 1531 Last data filed at 07/26/2020 1500 Gross per 24 hour  Intake 696.77 ml  Output 1550 ml  Net -853.23 ml    Exam Gen:- Awake Alert, chronically  ill-appearing no acute distress  HEENT:- [place].AT, No sclera icterus, -Neck-left IJ HD catheter Lungs-  CTAB , good air movement bilaterally  CV- S1, S2 normal, regular Abd-  +ve B.Sounds, Abd Soft, No tenderness,    Extremity/Skin:-Right upper extremity with pitting edema,   good pulses Psych-affect is appropriate, oriented x3 Neuro-generalized weakness no new focal deficits, no tremors  -MSK-Rt UE swelling, right antecubital fossa with AV fistula with positive thrill and bruit, right hip postop wound healing -Staples removed   Data Review   CBC w Diff:  Lab Results  Component Value Date   WBC 10.4 07/26/2020   HGB 8.5 (L) 07/26/2020   HCT 26.8 (L) 07/26/2020   PLT 430 (H) 07/26/2020   LYMPHOPCT 12 07/22/2020   MONOPCT 7 07/22/2020   EOSPCT 1 07/22/2020   BASOPCT 0 07/22/2020    CMP:  Lab Results  Component Value Date   NA 133 (L) 07/26/2020   K 4.2 07/26/2020   CL 94 (L) 07/26/2020   CO2 26 07/26/2020   BUN 40 (H) 07/26/2020   CREATININE 6.90 (H) 07/26/2020   PROT 6.8 07/11/2020   ALBUMIN 2.9 (L) 07/23/2020   BILITOT 0.7 07/11/2020   ALKPHOS 68 07/11/2020   AST 33 07/11/2020   ALT 15 07/11/2020  .   Total Discharge time is about 33 minutes  Roxan Hockey M.D on 07/26/2020 at 3:31 PM  Go to [email or web address] -  for contact info  [facility] - Office  [phone]

## 2020-07-26 NOTE — Telephone Encounter (Signed)
Needs hospital follow up in 8 weeks to schedule EGD to document ulcer healing.

## 2020-07-26 NOTE — Procedures (Signed)
I was present at this dialysis session. I have reviewed the session itself and made appropriate changes.   Seen near end of HD, had some mild ASx tachycardia. UF goal 1.5 to 2L. 3K bath. TDC Qb 400. Pt tolerating well. Hb up to 8.5.    Filed Weights   07/25/20 0439 07/25/20 2045 07/26/20 0830  Weight: 66.9 kg 65.6 kg 66.8 kg    Recent Labs  Lab 07/26/20 0450  NA 133*  K 4.2  CL 94*  CO2 26  GLUCOSE 111*  BUN 40*  CREATININE 6.90*  CALCIUM 8.5*  PHOS 3.3    Recent Labs  Lab 07/22/20 0841 07/23/20 0525 07/24/20 0457 07/25/20 0503 07/26/20 0450  WBC 11.6*   < > 14.3* 11.8* 10.4  NEUTROABS 9.2*  --   --   --   --   HGB 7.1*   < > 6.7* 7.8* 8.5*  HCT 23.5*   < > 21.2* 24.1* 26.8*  MCV 104.0*   < > 99.5 97.6 98.9  PLT 444*   < > 385 346 430*   < > = values in this interval not displayed.    Scheduled Meds: . acyclovir  200 mg Oral Daily  . anastrozole  1 mg Oral Daily  . atorvastatin  10 mg Oral QHS  . calcium-vitamin D  1 tablet Oral Daily  . Chlorhexidine Gluconate Cloth  6 each Topical Q0600  . Chlorhexidine Gluconate Cloth  6 each Topical Q0600  . Chlorhexidine Gluconate Cloth  6 each Topical Q0600  . cloNIDine  0.1 mg Oral Daily  . darbepoetin (ARANESP) injection - DIALYSIS  150 mcg Intravenous Q Mon-HD  . doxercalciferol  2 mcg Intravenous Q M,W,F-HD  . insulin aspart  0-6 Units Subcutaneous TID WC  . insulin aspart  8 Units Subcutaneous TID WC  . insulin glargine  10 Units Subcutaneous QHS  . melatonin  6 mg Oral QHS  . multivitamin with minerals  1 tablet Oral Q lunch  . sertraline  50 mg Oral Daily  . sucralfate  1 g Oral TID WC & HS   Continuous Infusions: . sodium chloride    . sodium chloride    . pantoprozole (PROTONIX) infusion 8 mg/hr (07/26/20 0552)   PRN Meds:.sodium chloride, sodium chloride, acetaminophen **OR** acetaminophen, alteplase, heparin, ondansetron **OR** ondansetron (ZOFRAN) IV   Pearson Grippe  MD 07/26/2020, 11:21 AM

## 2020-07-26 NOTE — Care Management Important Message (Signed)
Important Message  Patient Details  Name: Carly Wood MRN: [id number] Date of Birth: [birth year]   Medicare Important Message Given:  Yes     Tommy Medal 07/26/2020, 12:14 PM

## 2020-07-26 NOTE — Discharge Instructions (Signed)
1)Avoid ibuprofen/Advil/Aleve/Motrin/Goody Powders/Naproxen/BC powders/Meloxicam/Diclofenac/Indomethacin and other Nonsteroidal anti-inflammatory medications as these will make you more likely to bleed and can cause stomach ulcers, can also cause Kidney problems.   2)Repeat BMP and CBC on Monday, 07/29/2020  3) follow-up with gastroenterologist Dr. Laural Golden for repeat EGD to document healing of gastric and duodenal ulcers in about 8 weeks  4)-NovoLog insulin sliding scale ---insulin aspart (novoLOG) injection 0-10 Units 0-10 Units Subcutaneous, 3 times daily with meals CBG < 70: Implement Hypoglycemia Standing Orders and refer to Hypoglycemia Standing Orders sidebar report  CBG 70 - 120: 0 unit CBG 121 - 150: 0 unit  CBG 151 - 200: 1 unit CBG 201 - 250: 2 units CBG 251 - 300: 4 units CBG 301 - 350: 6 units  CBG 351 - 400: 8 units  CBG > 400: 10 units  5) Carafate and Protonix as ordered for gastric and duodenal ulcers  6) please call if dark stools or bleeding concerns  7) please continue hemodialysis on Mondays Wednesdays and Fridays

## 2020-07-26 NOTE — Progress Notes (Signed)
Subjective:  No complaints. Denies abdominal pain. Seen while on hemodialysis.   Objective: Vital signs in last 24 hours: Temp:  [98 F (36.7 C)-98.6 F (37 C)] 98 F (36.7 C) (05/20 0830) Pulse Rate:  [82-120] 120 (05/20 1030) Resp:  [11-24] 18 (05/20 1030) BP: (107-157)/(40-70) 107/57 (05/20 1030) SpO2:  [96 %-100 %] 100 % (05/20 0830) Weight:  [65.6 kg-66.8 kg] 66.8 kg (05/20 0830) Last BM Date: 07/24/20 General:   Alert,  Well-developed, well-nourished, pleasantly confused. NAD Head:  Normocephalic and atraumatic. Eyes:  Sclera clear, no icterus.  Abdomen:  Soft, nontender and nondistended.  Normal bowel sounds, without guarding, and without rebound.   Extremities:  Without clubbing, deformity or edema. Right upper extremity 3+ edema Neurologic:  Alert and  oriented x4;  grossly normal neurologically. Skin:  Intact without significant lesions or rashes. Psych:  Alert and cooperative. Normal mood and affect.  Intake/Output from previous day: 05/19 0701 - 05/20 0700 In: 656.8 [P.O.:440; I.V.:216.8] Out: 50 [Urine:50] Intake/Output this shift: Total I/O In: 240 [P.O.:240] Out: -   Lab Results: CBC Recent Labs    07/24/20 0457 07/25/20 0503 07/26/20 0450  WBC 14.3* 11.8* 10.4  HGB 6.7* 7.8* 8.5*  HCT 21.2* 24.1* 26.8*  MCV 99.5 97.6 98.9  PLT 385 346 430*   BMET Recent Labs    07/24/20 0457 07/25/20 0503 07/26/20 0450  NA 133* 131* 133*  K 4.7 4.0 4.2  CL 93* 94* 94*  CO2 _0 GLUCOSE 96 118* 111*  BUN 49* 26* 40*  CREATININE 6.55* 4.95* 6.90*  CALCIUM 8.0* 7.9* 8.5*   LFTs Recent Labs    07/23/20 1504  ALBUMIN 2.9*   No results for input(s): LIPASE in the last 72 hours. PT/INR No results for input(s): LABPROT, INR in the last 72 hours.    Imaging Studies: DG Chest 1 View  Result Date: 07/11/2020 CLINICAL DATA:  Pain following fall EXAM: CHEST  1 VIEW COMPARISON:  November 22, 2017 FINDINGS: Central catheter tip is at the cavoatrial  junction. No pneumothorax. There is slight left base atelectasis. Lungs elsewhere clear. Heart is upper normal in size with pulmonary vascularity normal. There is aortic atherosclerosis. No adenopathy. No bone lesions. There are surgical clips in the right axilla. IMPRESSION: Central catheter tip at cavoatrial junction. No pneumothorax. Mild left base atelectasis. No edema or airspace opacity. Stable cardiac silhouette. Aortic Atherosclerosis (ICD10-I70.0). Electronically Signed   By: Lowella Grip III M.D.   On: 07/11/2020 15:14   DG Pelvis 1-2 Views  Result Date: 07/12/2020 CLINICAL DATA:  The patient suffered a right hip fracture in a fall 07/11/2020. Initial encounter. EXAM: PELVIS - 1-2 VIEW COMPARISON:  Plain films right hip 07/11/2020. FINDINGS: New intramedullary nail and hip screw are in place for fixation of a right hip fracture. Hardware is intact. The intramedullary nail is incompletely imaged. Position and alignment are near anatomic. No acute abnormality. IMPRESSION: Status post fixation right hip fracture.  No acute finding. Electronically Signed   By: Inge Rise M.D.   On: 07/12/2020 15:07   DG Knee 1-2 Views Right  Result Date: 07/11/2020 CLINICAL DATA:  Recent fall with right knee pain, initial encounter EXAM: RIGHT KNEE - 2 VIEW COMPARISON:  None. FINDINGS: No acute fracture or dislocation is noted. Diffuse vascular calcifications are seen. No soft tissue abnormality is noted. IMPRESSION: No acute abnormality noted. Electronically Signed   By: Inez Catalina M.D.   On: 07/11/2020 15:13   PERIPHERAL VASCULAR  CATHETERIZATION  Result Date: 07/05/2020 Procedure: Right arm fistulogram, angioplasty right subclavian innominate vein superior vena cava Preoperative diagnosis: Venous hypertension right arm Postoperative diagnosis: Same Anesthesia: Local Operative findings: 1.  80% stenosis right subclavian and innominate and superior vena cava angioplastied up to a 10 mm balloon with  complete elastic recoil and no significant improvement Operative details: After obtaining informed consent, the patient was taken the PV lab.  The patient was placed in supine position the angio table.  Patient's right upper extremities prepped and draped in usual sterile fashion.  Local anesthesia was infiltrated over the right upper arm AV fistula.  Micropuncture needle was used to cannulate the right arm AV fistula under ultrasound guidance.  Micropuncture wire was advanced into the fistula.  Micropuncture sheath was placed over this.  Right arm fistulogram and central venogram was then performed through the sheath.  The superior vena cava innominate vein and subclavian veins on the right side are all patent.  However there were multiple segments areas of narrowing up to 80%.  There are abundant collaterals around the shoulder.  The remainder of the AV fistula is widely patent.  Compression on the outflow to reflux contrast across the arterial anastomosis showed this was widely patent as well. At this point the sheath was accessed with an 035 versa core wire and advanced across the central vein stenosis into the right atrium.  This was assisted with a KMP catheter.  A 7 French sheath was then placed over the wire.  Patient was given 3000 units of intravenous heparin.  I then angioplastied 3 discrete lesions in the right subclavian right innominate and superior vena cava utilizing multiple inflations of a 10 x 40 balloon.  Due to the recoil nature of the vein the balloon Sliding forward.  Therefore I swapped this out for a 1080 balloon.  This was then inflated to 8 atm for 1 minute.  Completion angiogram really showed no significant improvement and complete Recoil of the narrowing.  A similar procedure had been performed approximately 30 days ago.  In light of this I felt my options were to either stent her central vein potentially encroaching upon the left innominate system and jailing the right internal jugular  vein or abandoning this in favor of placing a thigh graft in the near future.  I opted not to stent the central vein segment. At this point the guidewire was removed the sheath was removed and the fistula opening closed with a single figure-of-eight Monocryl stitch. Operative management: The patient has had previous access procedures which failed in the left arm.  She now has venous hypertension and multiple areas of narrowing in the right innominate superior vena cava on the right side.  I do not believe we are going to obtain significant durability of her right arm fistula even if we stent this segment.  We will schedule the patient for outpatient follow-up for consideration of a thigh graft. Ruta Hinds, MD [facility] Office: [phone]   DG CHEST PORT 1 VIEW  Result Date: 07/23/2020 CLINICAL DATA:  Dyspnea, leukocytosis. EXAM: PORTABLE CHEST 1 VIEW COMPARISON:  Jul 14, 2020. FINDINGS: The heart size and mediastinal contours are within normal limits. Both lungs are clear. Stable position of left internal jugular dialysis catheter. No pneumothorax or pleural effusion is noted. The visualized skeletal structures are unremarkable. IMPRESSION: No active disease. Aortic Atherosclerosis (ICD10-I70.0). Electronically Signed   By: Marijo Conception M.D.   On: 07/23/2020 09:25  DG Chest Port 1 View  Result Date: 07/14/2020 CLINICAL DATA:  Short of breath EXAM: PORTABLE CHEST 1 VIEW COMPARISON:  Prior chest x-ray 07/11/2020 FINDINGS: Left IJ approach tunneled hemodialysis catheter. Catheter tip overlies the superior cavoatrial junction. Stable cardiac and mediastinal contours. Atherosclerotic calcifications again visualized in the transverse aorta. Increased pulmonary vascular congestion. Chronic elevation of the left hemidiaphragm with left basilar atelectasis. No pneumothorax. No acute osseous abnormality. IMPRESSION: 1. Increased pulmonary vascular congestion suggests either mild  CHF or fluid overload. 2. Stable and well-positioned left IJ tunneled hemodialysis catheter. Electronically Signed   By: Jacqulynn Cadet M.D.   On: 07/14/2020 11:08   DG HIP OPERATIVE UNILAT WITH PELVIS RIGHT  Result Date: 07/12/2020 CLINICAL DATA:  Hip fracture EXAM: OPERATIVE right HIP (WITH PELVIS IF PERFORMED) 14 VIEWS TECHNIQUE: Fluoroscopic spot image(s) were submitted for interpretation post-operatively. COMPARISON:  07/11/2020 FINDINGS: Fourteen low resolution intraoperative spot views of the right hip. Total fluoroscopy time was 1 minutes 50 seconds. The images demonstrate basicervical/intertrochanteric fracture with subsequent intramedullary rodding and distal screw fixation with anatomic alignment. IMPRESSION: Intraoperative fluoroscopic assistance provided during surgical fixation of proximal right femur fracture. Electronically Signed   By: Donavan Foil M.D.   On: 07/12/2020 16:02   DG Hip Unilat  With Pelvis 2-3 Views Right  Result Date: 07/11/2020 CLINICAL DATA:  Fall, right hip deformity EXAM: DG HIP (WITH OR WITHOUT PELVIS) 2-3V RIGHT COMPARISON:  None. FINDINGS: Single view radiograph pelvis and two view radiograph of the right hip demonstrates an acute, mildly impacted, angulated basicervical right femoral neck fracture with mild posterior and moderate varus angulation. The femoral head is still seated within the right acetabulum. Mild-to-moderate degenerative hip arthritis is present with joint space narrowing noted bilaterally. The pelvis and left hip are intact. Multiple involuted fibroids are seen within the pelvis. Advanced vascular calcifications are seen within the pelvis and medial thighs. IMPRESSION: Acute, impacted, angulated right basicervical femoral neck fracture. Electronically Signed   By: Fidela Salisbury MD   On: 07/11/2020 15:14   DG FEMUR PORT, MIN 2 VIEWS RIGHT  Result Date: 07/12/2020 CLINICAL DATA:  Postop femoral ORIF. EXAM: RIGHT FEMUR PORTABLE 2 VIEW COMPARISON:   Intraoperative views earlier the same date. Radiographs 07/11/2020. FINDINGS: Status post right femoral dynamic screw and intramedullary fixation of the comminuted intertrochanteric femur fracture. There is near anatomic reduction of the fracture. The hardware is well positioned. Lateral skin staples are present superior to the greater trochanter, and there is soft tissue emphysema laterally in the proximal to mid right thigh. No complications identified. Calcified uterine fibroids and femoral atherosclerosis are noted. IMPRESSION: Near anatomic reduction of intertrochanteric right femur fracture post ORIF. Electronically Signed   By: Richardean Sale M.D.   On: 07/12/2020 15:06  [2 weeks]   Assessment:  A 85 year old female, resident of [facility], history of multiple myeloma and followed by Dr. Delton Coombes, end-stage renal disease on dialysis, dementia, presenting via EMS due to hematemesis and melena, found to have acute on chronic anemia with hemoglobin of 7.1, previously 9.4 several days ago at discharge after hospitalization for hip fracture.  She was discharged on aspirin 81 mg twice daily.  She has a history of significant for peptic ulcer disease in June 2018, did complete surveillance EGD with documentation of healed ulcers.  At that time felt to be NSAID related.  EGD May 17 with 2 nonbleeding cratered gastric ulcers with no stigmata of bleeding in the prepyloric region of the stomach. 1 nonbleeding  cratered duodenal ulcer with stigmata of bleeding/pigmented material found in the duodenal bulb. Coagulation for bleeding prevention using gold probe unsuccessful, successfully injected with epinephrine for hemostasis. An additional nonbleeding cratered duodenal ulcer seen.  She received 2 units of packed red blood cells, last unit infused morning of May 18.  Hemoglobin slightly improved in the last 24 hours, up to 8.5 today.  Appropriate after second unit of packed red blood cells.  No  further overt GI bleeding.  Right upper extremity edema: notified attending.   Plan: 1. Continue sucralfate for 1 month. 2. Switch to oral pantoprazole 40 mg twice daily. 3. Will need EGD in 8-12 weeks to document ulcer healing.  4. If low-dose aspirin is needed, recommend waiting 2 weeks prior to resume Per Dr. Laural Golden.  Laureen Ochs. Bernarda Caffey [facility] [phone] 5/20/202212:19 PM     LOS: 4 days

## 2020-07-26 NOTE — Progress Notes (Signed)
   ORTHOPAEDIC PROGRESS NOTE  s/p Procedure(s): Cephalomedullary nail for right basicervical intertrochanteric femur fracture  DOS: 07/12/2020  SUBJECTIVE: Patient 2 weeks out from surgery.  Readmitted for GI bleed.  Stable now.  Confused this morning.  Not complaining of pain.  OBJECTIVE: PE:  Alert, confused  Incision is clean, dry and intact Sensation intact to dorsum and plantar foot Active motion intact TA/EHL/GS 2+ DP pulse  Vitals:   07/25/20 2351 07/26/20 0515  BP: (!) 132/59 (!) 143/62  Pulse: 82 87  Resp: 20 18  Temp: 98.6 F (37 C) 98.1 F (36.7 C)  SpO2: 96% 100%    ASSESSMENT: Carly Wood is a 85 y.o. female stable, 2 weeks postop  PLAN: Weightbearing: WBAT RLE Insicional and dressing care: staples to be removed, steri strips placed over entire incision; dressing PRN Orthopedic device(s): None VTE prophylaxis: recommendations of Medicine team Pain control: PRN medications, PO preferred Follow - up plan: 4 weeks   Contact information:     Lynden Flemmer A. Amedeo Kinsman, MD [facility] [address] [place],  [place]  [address] Phone: [phone] Fax: [phone]

## 2020-07-29 ENCOUNTER — Other Ambulatory Visit (HOSPITAL_COMMUNITY)
Admission: RE | Admit: 2020-07-29 | Discharge: 2020-07-29 | Disposition: A | Discharge status: Home / Self Care | Payer: Medicare PPO | Source: Skilled Nursing Facility | Attending: Adult Health | Admitting: Adult Health

## 2020-07-29 ENCOUNTER — Encounter: Payer: Self-pay | Admitting: Adult Health

## 2020-07-29 ENCOUNTER — Non-Acute Institutional Stay (SKILLED_NURSING_FACILITY): Payer: Medicare PPO | Admitting: Adult Health

## 2020-07-29 ENCOUNTER — Other Ambulatory Visit (HOSPITAL_COMMUNITY): Payer: Self-pay | Admitting: *Deleted

## 2020-07-29 DIAGNOSIS — I70209 Unspecified atherosclerosis of native arteries of extremities, unspecified extremity: Secondary | ICD-10-CM | POA: Diagnosis not present

## 2020-07-29 DIAGNOSIS — K219 Gastro-esophageal reflux disease without esophagitis: Secondary | ICD-10-CM

## 2020-07-29 DIAGNOSIS — Z992 Dependence on renal dialysis: Secondary | ICD-10-CM

## 2020-07-29 DIAGNOSIS — E1169 Type 2 diabetes mellitus with other specified complication: Secondary | ICD-10-CM

## 2020-07-29 DIAGNOSIS — K922 Gastrointestinal hemorrhage, unspecified: Secondary | ICD-10-CM | POA: Diagnosis not present

## 2020-07-29 DIAGNOSIS — E785 Hyperlipidemia, unspecified: Secondary | ICD-10-CM

## 2020-07-29 DIAGNOSIS — E1122 Type 2 diabetes mellitus with diabetic chronic kidney disease: Secondary | ICD-10-CM

## 2020-07-29 DIAGNOSIS — Z23 Encounter for immunization: Secondary | ICD-10-CM | POA: Diagnosis not present

## 2020-07-29 DIAGNOSIS — N186 End stage renal disease: Secondary | ICD-10-CM

## 2020-07-29 DIAGNOSIS — K259 Gastric ulcer, unspecified as acute or chronic, without hemorrhage or perforation: Secondary | ICD-10-CM

## 2020-07-29 DIAGNOSIS — C9000 Multiple myeloma not having achieved remission: Secondary | ICD-10-CM

## 2020-07-29 DIAGNOSIS — S72001S Fracture of unspecified part of neck of right femur, sequela: Secondary | ICD-10-CM | POA: Diagnosis not present

## 2020-07-29 DIAGNOSIS — K269 Duodenal ulcer, unspecified as acute or chronic, without hemorrhage or perforation: Secondary | ICD-10-CM | POA: Diagnosis not present

## 2020-07-29 DIAGNOSIS — F0151 Vascular dementia with behavioral disturbance: Secondary | ICD-10-CM

## 2020-07-29 DIAGNOSIS — I120 Hypertensive chronic kidney disease with stage 5 chronic kidney disease or end stage renal disease: Secondary | ICD-10-CM

## 2020-07-29 DIAGNOSIS — I700 Atherosclerosis of aorta: Secondary | ICD-10-CM | POA: Diagnosis not present

## 2020-07-29 DIAGNOSIS — F339 Major depressive disorder, recurrent, unspecified: Secondary | ICD-10-CM

## 2020-07-29 DIAGNOSIS — C50911 Malignant neoplasm of unspecified site of right female breast: Secondary | ICD-10-CM

## 2020-07-29 DIAGNOSIS — F01518 Vascular dementia, unspecified severity, with other behavioral disturbance: Secondary | ICD-10-CM

## 2020-07-29 DIAGNOSIS — D62 Acute posthemorrhagic anemia: Secondary | ICD-10-CM

## 2020-07-29 LAB — CBC WITH DIFFERENTIAL/PLATELET
Abs Immature Granulocytes: 0.13 10*3/uL — ABNORMAL HIGH (ref 0.00–0.07)
Basophils Absolute: 0.1 10*3/uL (ref 0.0–0.1)
Basophils Relative: 1 %
Eosinophils Absolute: 0.2 10*3/uL (ref 0.0–0.5)
Eosinophils Relative: 3 %
HCT: 27.3 % — ABNORMAL LOW (ref 36.0–46.0)
Hemoglobin: 8.4 g/dL — ABNORMAL LOW (ref 12.0–15.0)
Immature Granulocytes: 2 %
Lymphocytes Relative: 21 %
Lymphs Abs: 1.9 10*3/uL (ref 0.7–4.0)
MCH: 31.8 pg (ref 26.0–34.0)
MCHC: 30.8 g/dL (ref 30.0–36.0)
MCV: 103.4 fL — ABNORMAL HIGH (ref 80.0–100.0)
Monocytes Absolute: 1.1 10*3/uL — ABNORMAL HIGH (ref 0.1–1.0)
Monocytes Relative: 12 %
Neutro Abs: 5.6 10*3/uL (ref 1.7–7.7)
Neutrophils Relative %: 61 %
Platelets: 417 10*3/uL — ABNORMAL HIGH (ref 150–400)
RBC: 2.64 MIL/uL — ABNORMAL LOW (ref 3.87–5.11)
RDW: 18.7 % — ABNORMAL HIGH (ref 11.5–15.5)
WBC: 8.9 10*3/uL (ref 4.0–10.5)
nRBC: 0.3 % — ABNORMAL HIGH (ref 0.0–0.2)

## 2020-07-29 LAB — BASIC METABOLIC PANEL
Anion gap: 13 (ref 5–15)
BUN: 59 mg/dL — ABNORMAL HIGH (ref 8–23)
CO2: 27 mmol/L (ref 22–32)
Calcium: 8.8 mg/dL — ABNORMAL LOW (ref 8.9–10.3)
Chloride: 98 mmol/L (ref 98–111)
Creatinine, Ser: 9.34 mg/dL — ABNORMAL HIGH (ref 0.44–1.00)
GFR, Estimated: 4 mL/min — ABNORMAL LOW (ref >60)
Glucose, Bld: 75 mg/dL (ref 70–99)
Potassium: 4.7 mmol/L (ref 3.5–5.1)
Sodium: 138 mmol/L (ref 135–145)

## 2020-07-29 NOTE — Telephone Encounter (Signed)
[facility] is aware of OV on 7/11 at 230 with [facility]

## 2020-07-29 NOTE — Progress Notes (Signed)
Location:  [facility] Room Number: 148-D Place of Service:  SNF (31)   CODE STATUS: Full  Allergies  Allergen Reactions  . Ace Inhibitors Other (See Comments) and Cough    Tongue swell , ie angioedema  . Angiotensin Receptor Blockers     Angioedema with ACE-I  . Penicillins Other (See Comments)    Unsure of reaction Has patient had a PCN reaction causing immediate rash, facial/tongue/throat swelling, SOB or lightheadedness with hypotension: Unknown Has patient had a PCN reaction causing severe rash involving mucus membranes or skin necrosis: Unknown Has patient had a PCN reaction that required hospitalization: No Has patient had a PCN reaction occurring within the last 10 years: Unknown If all of the above answers are "NO", then may proceed with Cephalosporin use.      Chief Complaint  Patient presents with  . Hospitalization Follow-up    Hospital follow-up    HPI:  She is a 85 year old long term resident of this facility who has been hospitalized from 07-22-20 through 07-26-20. Her medical history included: gastric ulcer; esrd; diabetes; anemia.  She had recently been hospitalized for a hip fracture and was taking asa 81 mg twice daily. She developed acute upper GI bleed with rectal bleeding as well. She was taken to the ED for further evaluation and treatments.  A1. cute GI bleed with PUD. EGD on 07-23-20: with both gastric and duodenal ulcers. She was treated with IV Protonix for 72 hours; has transitioned to po treatment of protonix and carafate.  2. Acute on chronic anemia: she is post transfusion; receives epogen per nephrology. She will continue to be followed for her chronic illnesses including: Chronic kidney disease with end stage renal disease on dialysis due to type 2 diabetes mellitus/dependence on dialysis:      Hypertension due to end stage renal disease (ESRD) due to type 2 diabetes mellitus:    Hyperlipidemia associated with type 2 diabetes  mellitus  Diabetes mellitus with end stage renal disease (ESRD)     Past Medical History:  Diagnosis Date  . Anemia     chronic macrocytic anemia  . Anxiety   . Chronic kidney disease   . Chronic renal disease, stage 4, severely decreased glomerular filtration rate (GFR) between 15-29 mL/min/1.73 square meter (HCC) 08/22/2015  . Complication of anesthesia    delirious after Breast Surgery  . Dementia ([place])    mild  . Depression   . Diabetes mellitus with ESRD (end-stage renal disease) ([place])    type II  . Dyspnea    with activity  . GERD (gastroesophageal reflux disease)   . Glaucoma   . Hypertension   . Pneumonia   . Stage 1 infiltrating ductal carcinoma of right female breast ([place]) 08/21/2015   ER+ PR+ HER 2 neu + (3+) T1cN0     Past Surgical History:  Procedure Laterality Date  . A/V FISTULAGRAM Right 07/05/2020   Procedure: A/V Fistulagram;  Surgeon: Elam Dutch, MD;  Location: [facility];  Service: Cardiovascular;  Laterality: Right;  . AV FISTULA PLACEMENT Left 11/22/2017   Procedure: ARTERIOVENOUS (AV) FISTULA CREATION LEFT ARM;  Surgeon: Elam Dutch, MD;  Location: [facility];  Service: Vascular;  Laterality: Left;  . AV FISTULA PLACEMENT Right 04/04/2020   Procedure: RIGHT ARM ARTERIOVENOUS FISTULA CREATION;  Surgeon: Rosetta Posner, MD;  Location: [facility];  Service: Vascular;  Laterality: Right;  . BIOPSY  08/07/2016   Procedure: BIOPSY;  Surgeon:  Rourk, Cristopher Estimable, MD;  [facility];  Service: Endoscopy;;  gastric ulcer biopsy  . COLONOSCOPY    . ESOPHAGOGASTRODUODENOSCOPY N/A 08/07/2016   LA Grade A esophagitis s/p dilation, small hiatal hernia, multiple gastric ulcers and erosions, duodenal erosions s/p biopsy. Negative H.pylori   . ESOPHAGOGASTRODUODENOSCOPY N/A 11/27/2016   normal esophagus, previously noted gastric ulcers completely healed, normal duodenum.   . ESOPHAGOGASTRODUODENOSCOPY (EGD) WITH PROPOFOL N/A 07/23/2020   Procedure:  ESOPHAGOGASTRODUODENOSCOPY (EGD) WITH PROPOFOL;  Surgeon: Rogene [place], MD;  [facility];  Service: Endoscopy;  Laterality: N/A;  . FISTULA SUPERFICIALIZATION Left 02/14/2018   Procedure: FISTULA SUPERFICIALIZATION LEFT ARM;  Surgeon: Angelia Mould, MD;  Location: [facility];  Service: Vascular;  Laterality: Left;  . FRACTURE SURGERY Right    ankle  . HOT HEMOSTASIS  07/23/2020   Procedure: HOT HEMOSTASIS (ARGON PLASMA COAGULATION/BICAP);  Surgeon: Rogene [place], MD;  [facility];  Service: Endoscopy;;  . INTRAMEDULLARY (IM) NAIL INTERTROCHANTERIC Right 07/12/2020   Procedure: INTRAMEDULLARY (IM) NAIL INTERTROCHANTRIC;  Surgeon: Mordecai Rasmussen, MD;  Location: [facility];  Service: Orthopedics;  Laterality: Right;  . MALONEY DILATION N/A 08/07/2016   Procedure: Venia Minks DILATION;  Surgeon: Daneil Dolin, MD;  [facility];  Service: Endoscopy;  Laterality: N/A;  . MASTECTOMY, PARTIAL Right   . multiple myeloma    . PERIPHERAL VASCULAR BALLOON ANGIOPLASTY Left 07/13/2019   Procedure: PERIPHERAL VASCULAR BALLOON ANGIOPLASTY;  Surgeon: Marty Heck, MD;  Location: [facility];  Service: Cardiovascular;  Laterality: Left;  arm fistulogram  . PERIPHERAL VASCULAR BALLOON ANGIOPLASTY Right 05/22/2020   Procedure: PERIPHERAL VASCULAR BALLOON ANGIOPLASTY;  Surgeon: Cherre Robins, MD;  Location: [facility];  Service: Cardiovascular;  Laterality: Right;  arm fistula  . PERIPHERAL VASCULAR BALLOON ANGIOPLASTY Right 07/05/2020   Procedure: PERIPHERAL VASCULAR BALLOON ANGIOPLASTY;  Surgeon: Elam Dutch, MD;  Location: [facility];  Service: Cardiovascular;  Laterality: Right;  arm fistula  . PORT-A-CATH REMOVAL Left 11/22/2017   Procedure: REMOVAL PORT-A-CATH LEFT CHEST;  Surgeon: Elam Dutch, MD;  Location: [facility] OR;  Service: Vascular;  Laterality: Left;  . RETINAL DETACHMENT SURGERY Right   . SCLEROTHERAPY  07/23/2020   Procedure:  SCLEROTHERAPY;  Surgeon: Rogene [place], MD;  [facility];  Service: Endoscopy;;    Social History   Socioeconomic History  . Marital status: Single    Spouse name: Not on file  . Number of children: Not on file  . Years of education: Not on file  . Highest education level: Not on file  Occupational History  . Occupation: retired   Tobacco Use  . Smoking status: Never Smoker  . Smokeless tobacco: Never Used  Vaping Use  . Vaping Use: Never used  Substance and Sexual Activity  . Alcohol use: No    Alcohol/week: 0.0 standard drinks  . Drug use: No  . Sexual activity: Never  Other Topics Concern  . Not on file  Social History Narrative   Long term resident of SNF    Social Determinants of Health   Financial Resource Strain: Low Risk   . Difficulty of Paying Living Expenses: Not very hard  Food Insecurity: No Food Insecurity  . Worried About Charity fundraiser in the Last Year: Never true  . Ran Out of Food in the Last Year: Never true  Transportation Needs: No Transportation Needs  . Lack of Transportation (Medical): No  .  Lack of Transportation (Non-Medical): No  Physical Activity: Inactive  . Days of Exercise per Week: 0 days  . Minutes of Exercise per Session: 0 min  Stress: No Stress Concern Present  . Feeling of Stress : Not at all  Social Connections: Moderately Isolated  . Frequency of Communication with Friends and Family: More than three times a week  . Frequency of Social Gatherings with Friends and Family: Once a week  . Attends Religious Services: More than 4 times per year  . Active Member of Clubs or Organizations: No  . Attends Archivist Meetings: Never  . Marital Status: Never married  Intimate Partner Violence: Not At Risk  . Fear of Current or Ex-Partner: No  . Emotionally Abused: No  . Physically Abused: No  . Sexually Abused: No   Family History  Problem Relation Age of Onset  . Multiple myeloma Sister   . Brain  cancer Sister   . Dementia Mother        died at 71  . Stroke Mother   . Heart failure Mother   . Diabetes Mother   . Heart disease Father   . Prostate cancer Brother   . Colon cancer Neg Hx       VITAL SIGNS BP 108/68   Pulse 68   Temp 97.6 F (36.4 C)   Resp 18   Ht _0  (1.6 m)   Wt 142 lb 3.2 oz (64.5 kg)   SpO2 98%   BMI 25.19 kg/m   Outpatient Encounter Medications as of 07/29/2020  Medication Sig  . acetaminophen (TYLENOL) 650 MG CR tablet Take 650 mg by mouth every 6 (six) hours.  Marland Kitchen acyclovir (ZOVIRAX) 200 MG capsule Take 200 mg by mouth daily.  Marland Kitchen anastrozole (ARIMIDEX) 1 MG tablet TAKE 1 TABLET BY MOUTH DAILY (Patient taking differently: Take 1 mg by mouth daily.)  . atenolol (TENORMIN) 25 MG tablet Take 1 tablet (25 mg total) by mouth daily.  Marland Kitchen atorvastatin (LIPITOR) 10 MG tablet Take 10 mg by mouth at bedtime.  . Calcium Acetate 667 MG TABS Take 667 mg by mouth daily. Take 1 tablet w/ snacks daily  . Calcium Acetate 667 MG TABS Take 1,334 mg by mouth in the morning and at bedtime. 2 tabs with meals  . calcium-vitamin D (OSCAL WITH D) 500-200 MG-UNIT tablet Take 1 tablet by mouth daily. At 8 am  . cloNIDine (CATAPRES) 0.1 MG tablet Take 1 tablet (0.1 mg total) by mouth at bedtime. Hold for SBP < 110 bpm  . insulin aspart (NOVOLOG) 100 UNIT/ML FlexPen Inject 0-10 Units into the skin 3 (three) times daily with meals. insulin aspart (novoLOG) injection 0-10 Units 0-10 Units Subcutaneous, 3 times daily with meals CBG < 70: Implement Hypoglycemia Standing Orders and refer to Hypoglycemia Standing Orders sidebar report  CBG 70 - 120: 0 unit CBG 121 - 150: 0 unit  CBG 151 - 200: 1 unit CBG 201 - 250: 2 units CBG 251 - 300: 4 units CBG 301 - 350: 6 units  CBG 351 - 400: 8 units  CBG > 400: 10 units  . insulin glargine (LANTUS SOLOSTAR) 100 UNIT/ML Solostar Pen Inject 10 Units into the skin at bedtime.  . melatonin 3 MG TABS tablet Take 6 mg by mouth at bedtime. For Sleep  .  Multiple Vitamin (MULTIVITAMIN) capsule Take 1 capsule by mouth daily.  . NON FORMULARY Diet - renal carb modified 1200cc fluid restriction  Special Instructions: 1200cc fluid  restriction. Dietary to provide 420cc/24 hours, 7-3= 360cc/24hrs, 3-11=320cc/24hrs, 11-7=100cc/24hrs.  . ondansetron (ZOFRAN) 4 MG tablet Take 1 tablet (4 mg total) by mouth every 6 (six) hours as needed for nausea.  . pantoprazole (PROTONIX) 40 MG tablet Take 1 tablet (40 mg total) by mouth 2 (two) times daily before a meal.  . sertraline (ZOLOFT) 50 MG tablet Take 50 mg by mouth daily.  . Sucralfate (CARAFATE PO) Take by mouth. suspension; 100 mg/mL; amt: 10 ml; oral Special Instructions: Give with meals and at bedtime Four Times A Day; 08:00 AM, 12:00 PM, 05:00 PM, 09:00 PM  . [DISCONTINUED] sucralfate (CARAFATE) 1 GM/10ML suspension Take 10 mLs (1 g total) by mouth 4 (four) times daily -  with meals and at bedtime. (Patient not taking: Reported on 07/29/2020)   No facility-administered encounter medications on file as of 07/29/2020.     SIGNIFICANT DIAGNOSTIC EXAMS   PREVIOUS   07-11-20: chest x-ray:  Central catheter tip at cavoatrial junction. No pneumothorax. Mild left base atelectasis. No edema or airspace opacity. Stable cardiac silhouette. Aortic Atherosclerosis   07-11-20: right knee x-ray:  No acute fracture or dislocation is noted. Diffuse vascular calcifications are seen. No soft tissue abnormality is noted.  07-11-20: right hip x-ray:  Acute, impacted, angulated right basicervical femoral neck fracture.   TODAY  07-23-20: upper endoscopy:  Normal hypopharynx. - Normal esophagus. - Z-line irregular, 34 cm from the incisors. - Non-bleeding gastric ulcers with no stigmata of bleeding. - Duodenal ulcer with stigmata of bleed/ pigmented material. Gold probe therapy was not successful and hemostasis achieved with injection of dilute epinephrine. - Non-bleeding duodenal ulcer with no stigmata of  bleeding(second bulbar ulcer) - Normal second portion of the duodenum. - No specimens collected.  07-23-20: chest x-ray:No active disease. Aortic Atherosclerosis      LABS REVIEWED PREVIOUS  10-13-19: hgb a1c 6.0; vit B 12: 1126 folate 20.9 10-17-19: wbc 8.8; hgb 8.3; hct 26.1; mcv 114.0 plt 270; glucose 190; bun 28; creat 4.85; k+ 3.9; na++ 127; ca 8.9 liver normal albumin 4.0  10-31-19: wbc 6.0; hgb 8.8; hct 28.6; mcv 117.2 plt 326; glucose 188; bun 45; creat 6.28; k+ 3.9; na++ 137;ca 9.1 liver normal albumin 3.9 11-07-19: wbc 6.1; hgb 9.2; hct 29.4 mcv 115.7 plt 276; glucose 201; bun 44; creat 5.93; k+ 4.5; na++ 136; ca 9.1 liver normal albumin 3.8 11-14-19; wbc 8.3; hgb 9.7; hct 31.2 mcv 116.4 plt 261; glucose 198; bun 44; creat 6.66; k+ 5.1; na++ 138; ca 8.8 liver normal albumin 3.9   12-12-19: wbc 7.6; hgb 11.3; hct 35.6; mcv 109.9 plt 212; glucose 602; bun 67; creat 8.62; k+ 5.6; na++ 135; ca 7.9 liver normal albumin 3.5  12-20-19: hgb a1c 8.0  01-09-20: wbc 6.9; hgb 12.1; hct 39.7; mcv 105.6 plt 299; glucose 225; bun 112; creat 14.53; k+ 6.4; na++ 132; ca 8.3 liver normal albumin 3.5  01-23-20: k+ 6.6  01-23-20: wbc 7.9; hgb 11.6; hct 36.9; mcv 103.7 plt 359; glucose 139; bun 109; creat 15.46; k+ 6.1; na++ 140; ca 8.7 liver normal albumin 4.1;  01-24-20: k+ 4.4  01-30-20: wbc 10.6; hgb 9.7; hct 32.0; mcv 103.6 plt 390; glucose 164; bun 44; creat 9.08; k+ 4.1; na++ 139; ca 9.0 liver normal albumin 3.3 02-06-20: wbc 7.8; hgb 9.9; hct 32.7 mcv 104.8 plt 344; glucose 211; bun 90; creat 10.97; k+ 6.0 na++ 137; ca 8.8 liver normal albumin 3.4  03-26-20: wbc 6.3; hgb 9.4; hct 30.4; mcv 104.1; plt 300;  glucose 223; bun 101; creat 14.22; k+ 5.8; na++ 135; ca 8.1 GFR 2; liver normal albumin 3.4  05-21-20; wbc 5.1; hgb 10.4; hct 32.8; mcv 106.8 plt 303; glucose 186; bun 41; creat 1.54; k+ 5.0; na++ 132; ca 8.4 GFR 5; liver normal albumin 3.7 06-13-20: chol 189; ldl 106; trig 211; hdl 41; hgb a1c 6.2    07-11-20: wbc 9.1; hgb 10.6; hct 34.6; mcv 109.1 plt 248; glucose 79; bun 45; creat 7.71 k+ 4.9; na++ 134; ca 8.1 GFR 5; liver normal albumin 3.5 mag 2.4 07-13-20: wbc 11.4; hgb 8.9; hct 29.7; mcv 108.8 plt 303; glucose 178; bun 66; creat 11.17; k+ 6.6; na++ 135; ca 8.2; GFR 3 07-15-20: wbc 11.0; hgb 7.5; hct 25.2; mcv 108.2 plt 251; glucose 117; bun 60; creat 9.41; k+ 5.5; na++ 138; ca 8.4 GFR 4 07-17-20: wbc 11.5; hgb 9.4; hct 29.8; mcv 100.3 plt 300; glucose 140; bun 68; creat 8.04; k+ 4.2; na++ 135; ca 8.5 GFR 5  TODAY  07-22-20: wbc 11.6; hgb 7.1; hct 23.5; mcv 104.0 plt 444; glucose 214; bun 97; creat 9.18; k+ 4.5; na++ 140; ca 8.0; GFR4 07-24-20: wbc 14.3; hgb 6.7; hct 21.2; mcv 99.5 plt 385; glucose 96; bun 49; creat 6.55; k+ 4.7; na++ 133; ca 8.0; GFR4; mag 2.0 phos 3.8 07-26-20: wbc 10.4; hgb 8.5; hct 26.8; mcv 98.9 plt 430; glucose 111; bun 40;creat 6.90; k+ 4.2; na++ 133; ca 8.5; GFR 6; mag 2.2; phos 3.3      Review of Systems  Constitutional: Negative for malaise/fatigue.  Respiratory: Negative for cough and shortness of breath.   Cardiovascular: Negative for chest pain, palpitations and leg swelling.  Gastrointestinal: Negative for abdominal pain, constipation and heartburn.  Musculoskeletal: Positive for joint pain. Negative for back pain and myalgias.       Right hip pain   Skin: Negative.   Neurological: Negative for dizziness.  Psychiatric/Behavioral: The patient is not nervous/anxious.       Physical Exam Constitutional:      General: She is not in acute distress.    Appearance: She is well-developed. She is not diaphoretic.  Neck:     Thyroid: No thyromegaly.  Cardiovascular:     Rate and Rhythm: Normal rate and regular rhythm.     Pulses: Normal pulses.     Heart sounds: Normal heart sounds.  Pulmonary:     Effort: Pulmonary effort is normal. No respiratory distress.     Breath sounds: Normal breath sounds.  Abdominal:     General: Bowel sounds are normal. There is  no distension.     Palpations: Abdomen is soft.     Tenderness: There is no abdominal tenderness.  Musculoskeletal:     Cervical back: Neck supple.     Right lower leg: No edema.     Left lower leg: No edema.     Comments: Left neck dialysis access Right arm ace wrap on     Right hip IM nail 07-12-20     Lymphadenopathy:     Cervical: No cervical adenopathy.  Skin:    General: Skin is warm and dry.  Neurological:     Mental Status: She is alert. Mental status is at baseline.  Psychiatric:        Mood and Affect: Mood normal.      ASSESSMENT/ PLAN:  TODAY  1. Multiple gastric ulcers; duodenal ulcer; upper GI bleed: esophageal reflux disease unspecified esophagitis: is stable will continue protonix 40 mg twice daily and carafate  1 gm four times daily   2. Closed right hip sequela: is stable is status post right hip nail 07-12-20: will continue therapy as directed will follow up with orthopedics as indicated: will continue tylenol 650 mg every 6 hours  3. Chronic kidney disease with end stage renal disease on dialysis due to type 2 diabetes mellitus/dependence on dialysis: is without change is on hemodialysis three days weekly; is on 1200 cc fluid restriction. Will continue phoshyra 677 mg 2 tabs twice daily with meals and 1 with snacks  4.  Hypertension due to end stage renal disease (ESRD) due to type 2 diabetes mellitus: is stable b/p 108/68 will continue tenormin 25 mg daily clonidine 0.1 mg daily   5. Hyperlipidemia associated with type 2 diabetes mellitus is stable LDL 106 will continue lipitor 10 mg daily   6. Diabetes mellitus with end stage renal disease (ESRD) is stable hgb a1c 6.2 will continue basaglar 10 units nightly will change novolog to 5 units with meals for cbg >150.   7. Aortic atherosclerosis/atherosclerotic peripheral vascular disease: is stable (scan 10-04-18) will monitor   8. Vascular dementia with behavioral disturbance: is without change weight is 142  pounds  9. Anemia due to end stage renal disease/acute blood loss anemia: is currently stable hgb 8.5 did receive transfusion while in the hospital  10. Stage 1 infiltrating ductal carcinoma of female right breast/status post mastectomy right: will continue rimidex 1 mg daily   11. Multiple myeloma no having achieved remission: is followed by oncology  12. Herpes: without recent outbreak will continue acyclovir 200 mg daily   13. Major depression chronic recurrent: is stable will continue zoloft 100 mg daily     Will check cbc; cmp   Time spent with patient: 45 minutes; reviewing and adjusting medications; therapy needs and goal of care.    Ok Edwards NP [facility]  Contact [phone] Monday through Friday 8am- 5pm  After hours call [phone]

## 2020-07-29 NOTE — Progress Notes (Signed)
Carly Wood, Carly Wood   CLINIC:  Medical Oncology/Hematology  PCP:  Gerlene Fee, NP [address] [place] [address] [phone]   REASON FOR VISIT:  Follow-up for plasma cell myeloma and anemia  PRIOR THERAPY: none  NGS Results: not done  CURRENT THERAPY: Velcade & Decadron 3/4 weeks  BRIEF ONCOLOGIC HISTORY:  Oncology History  Stage 1 infiltrating ductal carcinoma of right female breast ([place])  09/12/2014 Mammogram   Mass in upper outer R breast, middle third depth appears slightly larger than it was on prior exam with more irreg spiculated margins   10/02/2014 Pathology Results   biopsy with invasive ductal carcinoma high grade 1.1 cm, dcis solid type. additional R breast tissue, excision, invasive ductal high grade 0.9 cm    10/24/2014 Pathology Results   no residual invasive carcinoma, DCIS, focal, atypical ductal hyperplasia, 0/7 LN positive for metastatic carcinoma ER > 90%, PR 30%, HER 2 2+   10/24/2014 Cancer Staging   T1cN0M0   10/24/2014 Surgery   R mastectomy, T1c, N0   12/28/2014 - 11/22/2015 Chemotherapy   Taxol/herceptin weekly X 12, Herceptin every 21 days, last due in September 2017   03/11/2015 -  Anti-estrogen oral therapy   Arimidex 1 mg daily   09/12/2015 Imaging   MUGA- The left ventricular ejection fraction equals 68%.   06/08/2016 Treatment Plan Change   Started Nerlynx   Multiple myeloma ([place])  10/04/2018 Initial Diagnosis   Multiple myeloma ([place])   10/11/2018 -  Chemotherapy    Patient is on Treatment Plan: MYELOMA NON-TRANSPLANT CANDIDATES VRD WEEKLY D21D         CANCER STAGING: Cancer Staging Stage 1 infiltrating ductal carcinoma of right female breast [facility]) Staging form: Breast, AJCC 7th Edition - Clinical stage from 08/30/2015: Stage IA (T1c, N0, M0) - Signed by Baird Cancer, PA-C on 08/30/2015   INTERVAL HISTORY:  Carly Wood, a 85 y.o. female, returns for routine  follow-up of her  plasma cell myeloma and anemia. Carly Wood was last seen on 05/28/2020.   Today she reports feeling well. She reported to the ED for rectal bleeding on 05/16. She denies black stools. She does not have the strength to stand independently. She denies n/v/d. Her right arm is swollen.  REVIEW OF SYSTEMS:  Review of Systems  Constitutional: Positive for fatigue (75%). Negative for appetite change.  All other systems reviewed and are negative.   PAST MEDICAL/SURGICAL HISTORY:  Past Medical History:  Diagnosis Date  . Anemia     chronic macrocytic anemia  . Anxiety   . Chronic kidney disease   . Chronic renal disease, stage 4, severely decreased glomerular filtration rate (GFR) between 15-29 mL/min/1.73 square meter (HCC) 08/22/2015  . Complication of anesthesia    delirious after Breast Surgery  . Dementia ([place])    mild  . Depression   . Diabetes mellitus with ESRD (end-stage renal disease) ([place])    type II  . Dyspnea    with activity  . GERD (gastroesophageal reflux disease)   . Glaucoma   . Hypertension   . Pneumonia   . Stage 1 infiltrating ductal carcinoma of right female breast ([place]) 08/21/2015   ER+ PR+ HER 2 neu + (3+) T1cN0    Past Surgical History:  Procedure Laterality Date  . A/V FISTULAGRAM Right 07/05/2020   Procedure: A/V Fistulagram;  Surgeon: Elam Dutch, MD;  Location: [place] [facility];  Service: Cardiovascular;  Laterality: Right;  . AV FISTULA PLACEMENT Left 11/22/2017   Procedure: ARTERIOVENOUS (AV) FISTULA CREATION LEFT ARM;  Surgeon: Elam Dutch, MD;  Location[facility];  Service: Vascular;  Laterality: Left;  . AV FISTULA PLACEMENT Right 04/04/2020   Procedure: RIGHT ARM ARTERIOVENOUS FISTULA CREATION;  Surgeon: Rosetta Posner, MD;  Location: [facility];  Service: Vascular;  Laterality: Right;  . BIOPSY  08/07/2016   Procedure: BIOPSY;  Surgeon: Daneil Dolin, MD;  [facility];  Service: Endoscopy;;  gastric ulcer biopsy  .  COLONOSCOPY    . ESOPHAGOGASTRODUODENOSCOPY N/A 08/07/2016   LA Grade A esophagitis s/p dilation, small hiatal hernia, multiple gastric ulcers and erosions, duodenal erosions s/p biopsy. Negative H.pylori   . ESOPHAGOGASTRODUODENOSCOPY N/A 11/27/2016   normal esophagus, previously noted gastric ulcers completely healed, normal duodenum.   . ESOPHAGOGASTRODUODENOSCOPY (EGD) WITH PROPOFOL N/A 07/23/2020   Procedure: ESOPHAGOGASTRODUODENOSCOPY (EGD) WITH PROPOFOL;  Surgeon: Rogene Houston, MD;  [facility];  Service: Endoscopy;  Laterality: N/A;  . FISTULA SUPERFICIALIZATION Left 02/14/2018   Procedure: FISTULA SUPERFICIALIZATION LEFT ARM;  Surgeon: Angelia Mould, MD;  Location: [place];  Service: Vascular;  Laterality: Left;  . FRACTURE SURGERY Right    ankle  . HOT HEMOSTASIS  07/23/2020   Procedure: HOT HEMOSTASIS (ARGON PLASMA COAGULATION/BICAP);  Surgeon: Rogene Houston, MD;  [facility];  Service: Endoscopy;;  . INTRAMEDULLARY (IM) NAIL INTERTROCHANTERIC Right 07/12/2020   Procedure: INTRAMEDULLARY (IM) NAIL INTERTROCHANTRIC;  Surgeon: Mordecai Rasmussen, MD;  Location: [facility];  Service: Orthopedics;  Laterality: Right;  . MALONEY DILATION N/A 08/07/2016   Procedure: Venia Minks DILATION;  Surgeon: Daneil Dolin, MD;  [facility];  Service: Endoscopy;  Laterality: N/A;  . MASTECTOMY, PARTIAL Right   . multiple myeloma    . PERIPHERAL VASCULAR BALLOON ANGIOPLASTY Left 07/13/2019   Procedure: PERIPHERAL VASCULAR BALLOON ANGIOPLASTY;  Surgeon: Marty Heck, MD;  Location: [place] [facility];  Service: Cardiovascular;  Laterality: Left;  arm fistulogram  . PERIPHERAL VASCULAR BALLOON ANGIOPLASTY Right 05/22/2020   Procedure: PERIPHERAL VASCULAR BALLOON ANGIOPLASTY;  Surgeon: Cherre Robins, MD;  Location: [place] [facility];  Service: Cardiovascular;  Laterality: Right;  arm fistula  . PERIPHERAL VASCULAR BALLOON ANGIOPLASTY Right 07/05/2020   Procedure:  PERIPHERAL VASCULAR BALLOON ANGIOPLASTY;  Surgeon: Elam Dutch, MD;  Location: [place] [facility];  Service: Cardiovascular;  Laterality: Right;  arm fistula  . PORT-A-CATH REMOVAL Left 11/22/2017   Procedure: REMOVAL PORT-A-CATH LEFT CHEST;  Surgeon: Elam Dutch, MD;  Location: [facility] OR;  Service: Vascular;  Laterality: Left;  . RETINAL DETACHMENT SURGERY Right   . SCLEROTHERAPY  07/23/2020   Procedure: SCLEROTHERAPY;  Surgeon: Rogene Houston, MD;  [facility];  Service: Endoscopy;;    SOCIAL HISTORY:  Social History   Socioeconomic History  . Marital status: Single    Spouse name: Not on file  . Number of children: Not on file  . Years of education: Not on file  . Highest education level: Not on file  Occupational History  . Occupation: retired   Tobacco Use  . Smoking status: Never Smoker  . Smokeless tobacco: Never Used  Vaping Use  . Vaping Use: Never used  Substance and Sexual Activity  . Alcohol use: No    Alcohol/week: 0.0 standard drinks  . Drug use: No  . Sexual activity: Never  Other Topics Concern  . Not on  file  Social History Narrative   Long term resident of SNF    Social Determinants of Health   Financial Resource Strain: Low Risk   . Difficulty of Paying Living Expenses: Not very hard  Food Insecurity: No Food Insecurity  . Worried About Charity fundraiser in the Last Year: Never true  . Ran Out of Food in the Last Year: Never true  Transportation Needs: No Transportation Needs  . Lack of Transportation (Medical): No  . Lack of Transportation (Non-Medical): No  Physical Activity: Inactive  . Days of Exercise per Week: 0 days  . Minutes of Exercise per Session: 0 min  Stress: No Stress Concern Present  . Feeling of Stress : Not at all  Social Connections: Moderately Isolated  . Frequency of Communication with Friends and Family: More than three times a week  . Frequency of Social Gatherings with Friends and Family: Once a week   . Attends Religious Services: More than 4 times per year  . Active Member of Clubs or Organizations: No  . Attends Archivist Meetings: Never  . Marital Status: Never married  Intimate Partner Violence: Not At Risk  . Fear of Current or Ex-Partner: No  . Emotionally Abused: No  . Physically Abused: No  . Sexually Abused: No    FAMILY HISTORY:  Family History  Problem Relation Age of Onset  . Multiple myeloma Sister   . Brain cancer Sister   . Dementia Mother        died at 46  . Stroke Mother   . Heart failure Mother   . Diabetes Mother   . Heart disease Father   . Prostate cancer Brother   . Colon cancer Neg Hx     CURRENT MEDICATIONS:  No current outpatient medications on file.   No current facility-administered medications for this visit.    ALLERGIES:  Allergies  Allergen Reactions  . Ace Inhibitors Other (See Comments) and Cough    Tongue swell , ie angioedema  . Angiotensin Receptor Blockers     Angioedema with ACE-I  . Penicillins Other (See Comments)    Unsure of reaction Has patient had a PCN reaction causing immediate rash, facial/tongue/throat swelling, SOB or lightheadedness with hypotension: Unknown Has patient had a PCN reaction causing severe rash involving mucus membranes or skin necrosis: Unknown Has patient had a PCN reaction that required hospitalization: No Has patient had a PCN reaction occurring within the last 10 years: Unknown If all of the above answers are "NO", then may proceed with Cephalosporin use.      PHYSICAL EXAM:  Performance status (ECOG): 2 - Symptomatic, <50% confined to bed  There were no vitals filed for this visit. Wt Readings from Last 3 Encounters:  07/29/20 142 lb 3.2 oz (64.5 kg)  07/26/20 140 lb 3.4 oz (63.6 kg)  07/18/20 142 lb 3.2 oz (64.5 kg)   Physical Exam Vitals reviewed.  Constitutional:      Appearance: Normal appearance.  Cardiovascular:     Rate and Rhythm: Normal rate and regular  rhythm.     Pulses: Normal pulses.     Heart sounds: Normal heart sounds.  Pulmonary:     Effort: Pulmonary effort is normal.     Breath sounds: Normal breath sounds.  Musculoskeletal:     Right lower leg: No edema.     Left lower leg: No edema.  Neurological:     General: No focal deficit present.     Mental  Status: She is alert and oriented to person, place, and time.  Psychiatric:        Mood and Affect: Mood normal.        Behavior: Behavior normal.      LABORATORY DATA:  I have reviewed the labs as listed.  CBC Latest Ref Rng & Units 07/29/2020 07/26/2020 07/25/2020  WBC 4.0 - 10.5 K/uL 8.9 10.4 11.8(H)  Hemoglobin 12.0 - 15.0 g/dL 8.4(L) 8.5(L) 7.8(L)  Hematocrit 36.0 - 46.0 % 27.3(L) 26.8(L) 24.1(L)  Platelets 150 - 400 K/uL 417(H) 430(H) 346   CMP Latest Ref Rng & Units 07/29/2020 07/26/2020 07/25/2020  Glucose 70 - 99 mg/dL 75 111(H) 118(H)  BUN 8 - 23 mg/dL 59(H) 40(H) 26(H)  Creatinine 0.44 - 1.00 mg/dL 9.34(H) 6.90(H) 4.95(H)  Sodium 135 - 145 mmol/L 138 133(L) 131(L)  Potassium 3.5 - 5.1 mmol/L 4.7 4.2 4.0  Chloride 98 - 111 mmol/L 98 94(L) 94(L)  CO2 22 - 32 mmol/L '27 26 26  ' Calcium 8.9 - 10.3 mg/dL 8.8(L) 8.5(L) 7.9(L)  Total Protein 6.5 - 8.1 g/dL - - -  Total Bilirubin 0.3 - 1.2 mg/dL - - -  Alkaline Phos 38 - 126 U/L - - -  AST 15 - 41 U/L - - -  ALT 0 - 44 U/L - - -    DIAGNOSTIC IMAGING:  I have independently reviewed the scans and discussed with the patient. DG Chest 1 View  Result Date: 07/11/2020 CLINICAL DATA:  Pain following fall EXAM: CHEST  1 VIEW COMPARISON:  November 22, 2017 FINDINGS: Central catheter tip is at the cavoatrial junction. No pneumothorax. There is slight left base atelectasis. Lungs elsewhere clear. Heart is upper normal in size with pulmonary vascularity normal. There is aortic atherosclerosis. No adenopathy. No bone lesions. There are surgical clips in the right axilla. IMPRESSION: Central catheter tip at cavoatrial junction. No  pneumothorax. Mild left base atelectasis. No edema or airspace opacity. Stable cardiac silhouette. Aortic Atherosclerosis (ICD10-I70.0). Electronically Signed   By: Lowella Grip III M.D.   On: 07/11/2020 15:14   DG Pelvis 1-2 Views  Result Date: 07/26/2020 CLINICAL DATA:  Follow-up ORIF of proximal right femoral fracture. EXAM: PELVIS - 1 VIEW COMPARISON:  07/12/2020 FINDINGS: Pelvic ring is intact. Multiple calcified uterine fibroids are seen. No acute fracture is noted. Prior proximal right femoral fracture with fixation is noted stable in appearance from the prior exam. No other focal abnormality is noted. IMPRESSION: Stable ORIF of proximal right femoral fracture. No acute abnormality noted. Electronically Signed   By: Inez Catalina M.D.   On: 07/26/2020 15:10   DG Pelvis 1-2 Views  Result Date: 07/12/2020 CLINICAL DATA:  The patient suffered a right hip fracture in a fall 07/11/2020. Initial encounter. EXAM: PELVIS - 1-2 VIEW COMPARISON:  Plain films right hip 07/11/2020. FINDINGS: New intramedullary nail and hip screw are in place for fixation of a right hip fracture. Hardware is intact. The intramedullary nail is incompletely imaged. Position and alignment are near anatomic. No acute abnormality. IMPRESSION: Status post fixation right hip fracture.  No acute finding. Electronically Signed   By: Inge Rise M.D.   On: 07/12/2020 15:07   DG Knee 1-2 Views Right  Result Date: 07/11/2020 CLINICAL DATA:  Recent fall with right knee pain, initial encounter EXAM: RIGHT KNEE - 2 VIEW COMPARISON:  None. FINDINGS: No acute fracture or dislocation is noted. Diffuse vascular calcifications are seen. No soft tissue abnormality is noted. IMPRESSION: No acute abnormality noted.  Electronically Signed   By: Inez Catalina M.D.   On: 07/11/2020 15:13   PERIPHERAL VASCULAR CATHETERIZATION  Result Date: 07/05/2020 Procedure: Right arm fistulogram, angioplasty right subclavian innominate vein superior vena  cava Preoperative diagnosis: Venous hypertension right arm Postoperative diagnosis: Same Anesthesia: Local Operative findings: 1.  80% stenosis right subclavian and innominate and superior vena cava angioplastied up to a 10 mm balloon with complete elastic recoil and no significant improvement Operative details: After obtaining informed consent, the patient was taken the PV lab.  The patient was placed in supine position the angio table.  Patient's right upper extremities prepped and draped in usual sterile fashion.  Local anesthesia was infiltrated over the right upper arm AV fistula.  Micropuncture needle was used to cannulate the right arm AV fistula under ultrasound guidance.  Micropuncture wire was advanced into the fistula.  Micropuncture sheath was placed over this.  Right arm fistulogram and central venogram was then performed through the sheath.  The superior vena cava innominate vein and subclavian veins on the right side are all patent.  However there were multiple segments areas of narrowing up to 80%.  There are abundant collaterals around the shoulder.  The remainder of the AV fistula is widely patent.  Compression on the outflow to reflux contrast across the arterial anastomosis showed this was widely patent as well. At this point the sheath was accessed with an 035 versa core wire and advanced across the central vein stenosis into the right atrium.  This was assisted with a KMP catheter.  A 7 French sheath was then placed over the wire.  Patient was given 3000 units of intravenous heparin.  I then angioplastied 3 discrete lesions in the right subclavian right innominate and superior vena cava utilizing multiple inflations of a 10 x 40 balloon.  Due to the recoil nature of the vein the balloon Sliding forward.  Therefore I swapped this out for a 1080 balloon.  This was then inflated to 8 atm for 1 minute.  Completion angiogram really showed no significant improvement and complete Recoil of the  narrowing.  A similar procedure had been performed approximately 30 days ago.  In light of this I felt my options were to either stent her central vein potentially encroaching upon the left innominate system and jailing the right internal jugular vein or abandoning this in favor of placing a thigh graft in the near future.  I opted not to stent the central vein segment. At this point the guidewire was removed the sheath was removed and the fistula opening closed with a single figure-of-eight Monocryl stitch. Operative management: The patient has had previous access procedures which failed in the left arm.  She now has venous hypertension and multiple areas of narrowing in the right innominate superior vena cava on the right side.  I do not believe we are going to obtain significant durability of her right arm fistula even if we stent this segment.  We will schedule the patient for outpatient follow-up for consideration of a thigh graft. Ruta Hinds, MD [facility] Office: [phone]   DG CHEST PORT 1 VIEW  Result Date: 07/23/2020 CLINICAL DATA:  Dyspnea, leukocytosis. EXAM: PORTABLE CHEST 1 VIEW COMPARISON:  Jul 14, 2020. FINDINGS: The heart size and mediastinal contours are within normal limits. Both lungs are clear. Stable position of left internal jugular dialysis catheter. No pneumothorax or pleural effusion is noted. The visualized skeletal structures are unremarkable. IMPRESSION: No active disease. Aortic  Atherosclerosis (ICD10-I70.0). Electronically Signed   By: Marijo Conception M.D.   On: 07/23/2020 09:25   DG Chest Port 1 View  Result Date: 07/14/2020 CLINICAL DATA:  Short of breath EXAM: PORTABLE CHEST 1 VIEW COMPARISON:  Prior chest x-ray 07/11/2020 FINDINGS: Left IJ approach tunneled hemodialysis catheter. Catheter tip overlies the superior cavoatrial junction. Stable cardiac and mediastinal contours. Atherosclerotic calcifications again visualized in the  transverse aorta. Increased pulmonary vascular congestion. Chronic elevation of the left hemidiaphragm with left basilar atelectasis. No pneumothorax. No acute osseous abnormality. IMPRESSION: 1. Increased pulmonary vascular congestion suggests either mild CHF or fluid overload. 2. Stable and well-positioned left IJ tunneled hemodialysis catheter. Electronically Signed   By: Jacqulynn Cadet M.D.   On: 07/14/2020 11:08   DG HIP OPERATIVE UNILAT WITH PELVIS RIGHT  Result Date: 07/12/2020 CLINICAL DATA:  Hip fracture EXAM: OPERATIVE right HIP (WITH PELVIS IF PERFORMED) 14 VIEWS TECHNIQUE: Fluoroscopic spot image(s) were submitted for interpretation post-operatively. COMPARISON:  07/11/2020 FINDINGS: Fourteen low resolution intraoperative spot views of the right hip. Total fluoroscopy time was 1 minutes 50 seconds. The images demonstrate basicervical/intertrochanteric fracture with subsequent intramedullary rodding and distal screw fixation with anatomic alignment. IMPRESSION: Intraoperative fluoroscopic assistance provided during surgical fixation of proximal right femur fracture. Electronically Signed   By: Donavan Foil M.D.   On: 07/12/2020 16:02   DG Hip Unilat  With Pelvis 2-3 Views Right  Result Date: 07/11/2020 CLINICAL DATA:  Fall, right hip deformity EXAM: DG HIP (WITH OR WITHOUT PELVIS) 2-3V RIGHT COMPARISON:  None. FINDINGS: Single view radiograph pelvis and two view radiograph of the right hip demonstrates an acute, mildly impacted, angulated basicervical right femoral neck fracture with mild posterior and moderate varus angulation. The femoral head is still seated within the right acetabulum. Mild-to-moderate degenerative hip arthritis is present with joint space narrowing noted bilaterally. The pelvis and left hip are intact. Multiple involuted fibroids are seen within the pelvis. Advanced vascular calcifications are seen within the pelvis and medial thighs. IMPRESSION: Acute, impacted, angulated  right basicervical femoral neck fracture. Electronically Signed   By: Fidela Salisbury MD   On: 07/11/2020 15:14   DG FEMUR, MIN 2 VIEWS RIGHT  Result Date: 07/26/2020 CLINICAL DATA:  Follow-up right femoral fracture EXAM: RIGHT FEMUR 2 VIEWS COMPARISON:  07/12/2020 FINDINGS: Proximal femoral neck fracture is again identified with fixation rod and fixation screws. No new fracture is seen. No soft tissue abnormality is noted. IMPRESSION: Status post ORIF of proximal right femoral fracture. Electronically Signed   By: Inez Catalina M.D.   On: 07/26/2020 15:10   DG FEMUR PORT, MIN 2 VIEWS RIGHT  Result Date: 07/12/2020 CLINICAL DATA:  Postop femoral ORIF. EXAM: RIGHT FEMUR PORTABLE 2 VIEW COMPARISON:  Intraoperative views earlier the same date. Radiographs 07/11/2020. FINDINGS: Status post right femoral dynamic screw and intramedullary fixation of the comminuted intertrochanteric femur fracture. There is near anatomic reduction of the fracture. The hardware is well positioned. Lateral skin staples are present superior to the greater trochanter, and there is soft tissue emphysema laterally in the proximal to mid right thigh. No complications identified. Calcified uterine fibroids and femoral atherosclerosis are noted. IMPRESSION: Near anatomic reduction of intertrochanteric right femur fracture post ORIF. Electronically Signed   By: Richardean Sale M.D.   On: 07/12/2020 15:06     ASSESSMENT:  1. IgA lambda plasma cell myeloma, stage II, standard risk: -BMBX on 09/20/2018 shows plasma cell myeloma, 30% plasma cells. FISH panel was normal. Chromosome analysis normal. -  Labs at diagnosis on 08/29/2018 with M spike 0.9 g. Kappa light chains 148, lambda light chains 1132, ratio 0.13. LDH normal. Beta-2 microglobulin 13.5. She had transfusion dependent anemia. -Velcade and dexamethasone started on 10/11/2018. -Myeloma panel on 07/11/2019 shows SPEP is negative. Free light chain ratio improved to 1.09. Lambda  light chains are 128, improved from 141. Kappa light chains are 140. -She has been resident at [facility] since 10/12/2019.  2. Stage I IDC of the right breast, ER/PR positive, HER-2 negative: -She is on anastrozole.   PLAN:  1. IgA lambda plasma cell myeloma: -She was recently hospitalized due to GI bleed and received blood transfusion. - Today she is feeling reasonably well.  She is doing physical therapy.  She denies any bleeding per rectum or melena. - Reviewed labs from today which showed normal LFTs.  Other electrolytes were normal.  Hemoglobin was 7.3 with normal white count and platelet count.  Hemoglobin is down by one-point from yesterday, but she had dialysis yesterday. - Recommend proceeding with Velcade today.  We will reevaluate her in 4 weeks.  2. ESRD on HD: -She received dialysis yesterday.  Continue HD on Monday, Wednesday and Friday.  3. Bone strengthening: -Bisphosphonates were not started due to patient being on dialysis.  4. Osteopenia: -We will plan to repeat DEXA scan in the future.  Last bone density in 2017 was osteopenia.  5. Right breast cancer: -She will continue anastrozole.   Orders placed this encounter:  No orders of the defined types were placed in this encounter.    Derek Jack, MD [place] [phone]   I, Thana Ates, am acting as a scribe for Dr. Derek Jack.  I, Derek Jack MD, have reviewed the above documentation for accuracy and completeness, and I agree with the above.

## 2020-07-30 ENCOUNTER — Inpatient Hospital Stay (HOSPITAL_COMMUNITY): Payer: Medicare PPO | Attending: Hematology | Admitting: Hematology

## 2020-07-30 ENCOUNTER — Other Ambulatory Visit (HOSPITAL_COMMUNITY)
Admission: RE | Admit: 2020-07-30 | Discharge: 2020-07-30 | Disposition: A | Discharge status: Home / Self Care | Payer: Medicare PPO | Source: Skilled Nursing Facility | Attending: Adult Health | Admitting: Adult Health

## 2020-07-30 ENCOUNTER — Ambulatory Visit (HOSPITAL_COMMUNITY): Payer: Medicare PPO

## 2020-07-30 ENCOUNTER — Inpatient Hospital Stay (HOSPITAL_COMMUNITY): Payer: Medicare PPO

## 2020-07-30 ENCOUNTER — Other Ambulatory Visit: Payer: Self-pay

## 2020-07-30 ENCOUNTER — Encounter: Payer: Self-pay | Admitting: Adult Health

## 2020-07-30 ENCOUNTER — Encounter (HOSPITAL_COMMUNITY): Payer: Self-pay | Admitting: Hematology

## 2020-07-30 ENCOUNTER — Other Ambulatory Visit (HOSPITAL_COMMUNITY): Payer: Medicare PPO

## 2020-07-30 ENCOUNTER — Non-Acute Institutional Stay (SKILLED_NURSING_FACILITY): Payer: Medicare PPO | Admitting: Adult Health

## 2020-07-30 VITALS — BP 112/68 | HR 61 | Temp 97.3°F | Resp 18

## 2020-07-30 DIAGNOSIS — C9000 Multiple myeloma not having achieved remission: Secondary | ICD-10-CM | POA: Insufficient documentation

## 2020-07-30 DIAGNOSIS — Z170 Estrogen receptor positive status [ER+]: Secondary | ICD-10-CM | POA: Insufficient documentation

## 2020-07-30 DIAGNOSIS — C9001 Multiple myeloma in remission: Secondary | ICD-10-CM | POA: Diagnosis not present

## 2020-07-30 DIAGNOSIS — D649 Anemia, unspecified: Secondary | ICD-10-CM | POA: Diagnosis not present

## 2020-07-30 DIAGNOSIS — F0390 Unspecified dementia without behavioral disturbance: Secondary | ICD-10-CM | POA: Insufficient documentation

## 2020-07-30 DIAGNOSIS — Z79811 Long term (current) use of aromatase inhibitors: Secondary | ICD-10-CM | POA: Diagnosis not present

## 2020-07-30 DIAGNOSIS — E1122 Type 2 diabetes mellitus with diabetic chronic kidney disease: Secondary | ICD-10-CM | POA: Insufficient documentation

## 2020-07-30 DIAGNOSIS — S72141D Displaced intertrochanteric fracture of right femur, subsequent encounter for closed fracture with routine healing: Secondary | ICD-10-CM | POA: Insufficient documentation

## 2020-07-30 DIAGNOSIS — M7989 Other specified soft tissue disorders: Secondary | ICD-10-CM | POA: Diagnosis not present

## 2020-07-30 DIAGNOSIS — Z5111 Encounter for antineoplastic chemotherapy: Secondary | ICD-10-CM | POA: Insufficient documentation

## 2020-07-30 DIAGNOSIS — C50411 Malignant neoplasm of upper-outer quadrant of right female breast: Secondary | ICD-10-CM | POA: Insufficient documentation

## 2020-07-30 DIAGNOSIS — M8580 Other specified disorders of bone density and structure, unspecified site: Secondary | ICD-10-CM | POA: Insufficient documentation

## 2020-07-30 DIAGNOSIS — E43 Unspecified severe protein-calorie malnutrition: Secondary | ICD-10-CM

## 2020-07-30 DIAGNOSIS — N186 End stage renal disease: Secondary | ICD-10-CM | POA: Diagnosis not present

## 2020-07-30 LAB — CBC
HCT: 24.1 % — ABNORMAL LOW (ref 36.0–46.0)
Hemoglobin: 7.3 g/dL — ABNORMAL LOW (ref 12.0–15.0)
MCH: 31.3 pg (ref 26.0–34.0)
MCHC: 30.3 g/dL (ref 30.0–36.0)
MCV: 103.4 fL — ABNORMAL HIGH (ref 80.0–100.0)
Platelets: 345 10*3/uL (ref 150–400)
RBC: 2.33 MIL/uL — ABNORMAL LOW (ref 3.87–5.11)
RDW: 18.7 % — ABNORMAL HIGH (ref 11.5–15.5)
WBC: 7.6 10*3/uL (ref 4.0–10.5)
nRBC: 0 % (ref 0.0–0.2)

## 2020-07-30 LAB — COMPREHENSIVE METABOLIC PANEL
ALT: 15 U/L (ref 0–44)
AST: 24 U/L (ref 15–41)
Albumin: 2.8 g/dL — ABNORMAL LOW (ref 3.5–5.0)
Alkaline Phosphatase: 88 U/L (ref 38–126)
Anion gap: 12 (ref 5–15)
BUN: 26 mg/dL — ABNORMAL HIGH (ref 8–23)
CO2: 30 mmol/L (ref 22–32)
Calcium: 8.6 mg/dL — ABNORMAL LOW (ref 8.9–10.3)
Chloride: 97 mmol/L — ABNORMAL LOW (ref 98–111)
Creatinine, Ser: 5.89 mg/dL — ABNORMAL HIGH (ref 0.44–1.00)
GFR, Estimated: 7 mL/min — ABNORMAL LOW (ref >60)
Glucose, Bld: 97 mg/dL (ref 70–99)
Potassium: 4.1 mmol/L (ref 3.5–5.1)
Sodium: 139 mmol/L (ref 135–145)
Total Bilirubin: 0.5 mg/dL (ref 0.3–1.2)
Total Protein: 5.8 g/dL — ABNORMAL LOW (ref 6.5–8.1)

## 2020-07-30 LAB — LACTATE DEHYDROGENASE: LDH: 210 U/L — ABNORMAL HIGH (ref 98–192)

## 2020-07-30 MED ORDER — DEXAMETHASONE 4 MG PO TABS
10.0000 mg | ORAL_TABLET | Freq: Once | ORAL | Status: AC
  Administered 2020-07-30: 10 mg via ORAL

## 2020-07-30 MED ORDER — PROCHLORPERAZINE MALEATE 10 MG PO TABS
10.0000 mg | ORAL_TABLET | Freq: Once | ORAL | Status: AC
  Administered 2020-07-30: 10 mg via ORAL

## 2020-07-30 MED ORDER — PROCHLORPERAZINE MALEATE 10 MG PO TABS
ORAL_TABLET | ORAL | Status: AC
  Filled 2020-07-30: qty 1

## 2020-07-30 MED ORDER — BORTEZOMIB CHEMO SQ INJECTION 3.5 MG (2.5MG/ML)
1.3000 mg/m2 | Freq: Once | INTRAMUSCULAR | Status: AC
  Administered 2020-07-30: 2.25 mg via SUBCUTANEOUS
  Filled 2020-07-30: qty 0.9

## 2020-07-30 MED ORDER — DEXAMETHASONE 4 MG PO TABS
ORAL_TABLET | ORAL | Status: AC
  Filled 2020-07-30: qty 3

## 2020-07-30 NOTE — Progress Notes (Signed)
Location:  [facility] Room Number: 354 Place of Service:  SNF (31)   CODE STATUS: full code   Allergies  Allergen Reactions  . Ace Inhibitors Other (See Comments) and Cough    Tongue swell , ie angioedema  . Angiotensin Receptor Blockers     Angioedema with ACE-I  . Penicillins Other (See Comments)    Unsure of reaction Has patient had a PCN reaction causing immediate rash, facial/tongue/throat swelling, SOB or lightheadedness with hypotension: Unknown Has patient had a PCN reaction causing severe rash involving mucus membranes or skin necrosis: Unknown Has patient had a PCN reaction that required hospitalization: No Has patient had a PCN reaction occurring within the last 10 years: Unknown If all of the above answers are "NO", then may proceed with Cephalosporin use.      Chief Complaint  Patient presents with  . Acute Visit    Follow up labs.     HPI:  Her lab work today demonstrates an albumin of 2.8. she will need protein supplement. There are no reports of increased weakness. She has been hospitalized for right hip fracture and GI bleed.   Past Medical History:  Diagnosis Date  . Anemia     chronic macrocytic anemia  . Anxiety   . Chronic kidney disease   . Chronic renal disease, stage 4, severely decreased glomerular filtration rate (GFR) between 15-29 mL/min/1.73 square meter (HCC) 08/22/2015  . Complication of anesthesia    delirious after Breast Surgery  . Dementia ([place])    mild  . Depression   . Diabetes mellitus with ESRD (end-stage renal disease) ([place])    type II  . Dyspnea    with activity  . GERD (gastroesophageal reflux disease)   . Glaucoma   . Hypertension   . Pneumonia   . Stage 1 infiltrating ductal carcinoma of right female breast ([place]) 08/21/2015   ER+ PR+ HER 2 neu + (3+) T1cN0     Past Surgical History:  Procedure Laterality Date  . A/V FISTULAGRAM Right 07/05/2020   Procedure: A/V Fistulagram;  Surgeon:  Elam Dutch, MD;  Location: [facility];  Service: Cardiovascular;  Laterality: Right;  . AV FISTULA PLACEMENT Left 11/22/2017   Procedure: ARTERIOVENOUS (AV) FISTULA CREATION LEFT ARM;  Surgeon: Elam Dutch, MD;  Location: [facility] OR;  Service: Vascular;  Laterality: Left;  . AV FISTULA PLACEMENT Right 04/04/2020   Procedure: RIGHT ARM ARTERIOVENOUS FISTULA CREATION;  Surgeon: Rosetta Posner, MD;  Location: [facility] ORS;  Service: Vascular;  Laterality: Right;  . BIOPSY  08/07/2016   Procedure: BIOPSY;  Surgeon: Daneil Dolin, MD;  Location[facility];  Service: Endoscopy;;  gastric ulcer biopsy  . COLONOSCOPY    . ESOPHAGOGASTRODUODENOSCOPY N/A 08/07/2016   LA Grade A esophagitis s/p dilation, small hiatal hernia, multiple gastric ulcers and erosions, duodenal erosions s/p biopsy. Negative H.pylori   . ESOPHAGOGASTRODUODENOSCOPY N/A 11/27/2016   normal esophagus, previously noted gastric ulcers completely healed, normal duodenum.   . ESOPHAGOGASTRODUODENOSCOPY (EGD) WITH PROPOFOL N/A 07/23/2020   Procedure: ESOPHAGOGASTRODUODENOSCOPY (EGD) WITH PROPOFOL;  Surgeon: Rogene Houston, MD;  [facility];  Service: Endoscopy;  Laterality: N/A;  . FISTULA SUPERFICIALIZATION Left 02/14/2018   Procedure: FISTULA SUPERFICIALIZATION LEFT ARM;  Surgeon: Angelia Mould, MD;  Location: [facility];  Service: Vascular;  Laterality: Left;  . FRACTURE SURGERY Right    ankle  . HOT HEMOSTASIS  07/23/2020   Procedure: HOT HEMOSTASIS (ARGON  PLASMA COAGULATION/BICAP);  Surgeon: Rogene Houston, MD;  [facility];  Service: Endoscopy;;  . INTRAMEDULLARY (IM) NAIL INTERTROCHANTERIC Right 07/12/2020   Procedure: INTRAMEDULLARY (IM) NAIL INTERTROCHANTRIC;  Surgeon: Mordecai Rasmussen, MD;  Location: [facility];  Service: Orthopedics;  Laterality: Right;  . MALONEY DILATION N/A 08/07/2016   Procedure: Venia Minks DILATION;  Surgeon: Daneil Dolin, MD;  [facility];  Service: Endoscopy;   Laterality: N/A;  . MASTECTOMY, PARTIAL Right   . multiple myeloma    . PERIPHERAL VASCULAR BALLOON ANGIOPLASTY Left 07/13/2019   Procedure: PERIPHERAL VASCULAR BALLOON ANGIOPLASTY;  Surgeon: Marty Heck, MD;  Location: [place] [facility];  Service: Cardiovascular;  Laterality: Left;  arm fistulogram  . PERIPHERAL VASCULAR BALLOON ANGIOPLASTY Right 05/22/2020   Procedure: PERIPHERAL VASCULAR BALLOON ANGIOPLASTY;  Surgeon: Cherre Robins, MD;  Location: [place] [facility];  Service: Cardiovascular;  Laterality: Right;  arm fistula  . PERIPHERAL VASCULAR BALLOON ANGIOPLASTY Right 07/05/2020   Procedure: PERIPHERAL VASCULAR BALLOON ANGIOPLASTY;  Surgeon: Elam Dutch, MD;  Location: [place] [facility];  Service: Cardiovascular;  Laterality: Right;  arm fistula  . PORT-A-CATH REMOVAL Left 11/22/2017   Procedure: REMOVAL PORT-A-CATH LEFT CHEST;  Surgeon: Elam Dutch, MD;  Location: [facility];  Service: Vascular;  Laterality: Left;  . RETINAL DETACHMENT SURGERY Right   . SCLEROTHERAPY  07/23/2020   Procedure: SCLEROTHERAPY;  Surgeon: Rogene Houston, MD;  [facility];  Service: Endoscopy;;    Social History   Socioeconomic History  . Marital status: Single    Spouse name: Not on file  . Number of children: Not on file  . Years of education: Not on file  . Highest education level: Not on file  Occupational History  . Occupation: retired   Tobacco Use  . Smoking status: Never Smoker  . Smokeless tobacco: Never Used  Vaping Use  . Vaping Use: Never used  Substance and Sexual Activity  . Alcohol use: No    Alcohol/week: 0.0 standard drinks  . Drug use: No  . Sexual activity: Never  Other Topics Concern  . Not on file  Social History Narrative   Long term resident of SNF    Social Determinants of Health   Financial Resource Strain: Low Risk   . Difficulty of Paying Living Expenses: Not very hard  Food Insecurity: No Food Insecurity  . Worried About Paediatric nurse in the Last Year: Never true  . Ran Out of Food in the Last Year: Never true  Transportation Needs: No Transportation Needs  . Lack of Transportation (Medical): No  . Lack of Transportation (Non-Medical): No  Physical Activity: Inactive  . Days of Exercise per Week: 0 days  . Minutes of Exercise per Session: 0 min  Stress: No Stress Concern Present  . Feeling of Stress : Not at all  Social Connections: Moderately Isolated  . Frequency of Communication with Friends and Family: More than three times a week  . Frequency of Social Gatherings with Friends and Family: Once a week  . Attends Religious Services: More than 4 times per year  . Active Member of Clubs or Organizations: No  . Attends Archivist Meetings: Never  . Marital Status: Never married  Intimate Partner Violence: Not At Risk  . Fear of Current or Ex-Partner: No  . Emotionally Abused: No  . Physically Abused: No  . Sexually Abused: No   Family History  Problem Relation Age  of Onset  . Multiple myeloma Sister   . Brain cancer Sister   . Dementia Mother        died at 43  . Stroke Mother   . Heart failure Mother   . Diabetes Mother   . Heart disease Father   . Prostate cancer Brother   . Colon cancer Neg Hx       VITAL SIGNS BP 128/87   Pulse 67   Temp 98.7 F (37.1 C)   Ht '5\' 3"'  (1.6 m)   Wt 142 lb (64.4 kg)   BMI 25.15 kg/m   Outpatient Encounter Medications as of 07/30/2020  Medication Sig  . acetaminophen (TYLENOL) 650 MG CR tablet Take 650 mg by mouth every 6 (six) hours.  Marland Kitchen acyclovir (ZOVIRAX) 200 MG capsule Take 200 mg by mouth daily.  Marland Kitchen anastrozole (ARIMIDEX) 1 MG tablet TAKE 1 TABLET BY MOUTH DAILY (Patient taking differently: Take 1 mg by mouth daily.)  . atenolol (TENORMIN) 25 MG tablet Take 1 tablet (25 mg total) by mouth daily.  Marland Kitchen atorvastatin (LIPITOR) 10 MG tablet Take 10 mg by mouth at bedtime.  . Calcium Acetate 667 MG TABS Take 667 mg by mouth daily. Take 1 tablet  w/ snacks daily  . Calcium Acetate 667 MG TABS Take 1,334 mg by mouth in the morning and at bedtime. 2 tabs with meals  . calcium-vitamin D (OSCAL WITH D) 500-200 MG-UNIT tablet Take 1 tablet by mouth daily. At 8 am  . cloNIDine (CATAPRES) 0.1 MG tablet Take 1 tablet (0.1 mg total) by mouth at bedtime. Hold for SBP < 110 bpm  . Insulin Aspart (NOVOLOG FLEXPEN [name]) Inject 5 Units into the skin 3 (three) times daily with meals. For CBG >150  . insulin glargine (LANTUS SOLOSTAR) 100 UNIT/ML Solostar Pen Inject 10 Units into the skin at bedtime.  . melatonin 3 MG TABS tablet Take 6 mg by mouth at bedtime. For Sleep  . Multiple Vitamin (MULTIVITAMIN) capsule Take 1 capsule by mouth daily.  . NON FORMULARY Diet - renal carb modified 1200cc fluid restriction  Special Instructions: 1200cc fluid restriction. Dietary to provide 420cc/24 hours, 7-3= 360cc/24hrs, 3-11=320cc/24hrs, 11-7=100cc/24hrs.  . ondansetron (ZOFRAN) 4 MG tablet Take 1 tablet (4 mg total) by mouth every 6 (six) hours as needed for nausea.  . pantoprazole (PROTONIX) 40 MG tablet Take 1 tablet (40 mg total) by mouth 2 (two) times daily before a meal.  . sertraline (ZOLOFT) 50 MG tablet Take 50 mg by mouth daily.  . Sucralfate (CARAFATE PO) Take by mouth. suspension; 100 mg/mL; amt: 10 ml; oral Special Instructions: Give with meals and at bedtime Four Times A Day; 08:00 AM, 12:00 PM, 05:00 PM, 09:00 PM   No facility-administered encounter medications on file as of 07/30/2020.     SIGNIFICANT DIAGNOSTIC EXAMS  PREVIOUS   07-11-20: chest x-ray:  Central catheter tip at cavoatrial junction. No pneumothorax. Mild left base atelectasis. No edema or airspace opacity. Stable cardiac silhouette. Aortic Atherosclerosis   07-11-20: right knee x-ray:  No acute fracture or dislocation is noted. Diffuse vascular calcifications are seen. No soft tissue abnormality is noted.  07-11-20: right hip x-ray:  Acute, impacted, angulated right basicervical  femoral neck fracture.   TODAY  07-23-20: upper endoscopy:  Normal hypopharynx. - Normal esophagus. - Z-line irregular, 34 cm from the incisors. - Non-bleeding gastric ulcers with no stigmata of bleeding. - Duodenal ulcer with stigmata of bleed/ pigmented material. Gold probe therapy was not successful  and hemostasis achieved with injection of dilute epinephrine. - Non-bleeding duodenal ulcer with no stigmata of bleeding(second bulbar ulcer) - Normal second portion of the duodenum. - No specimens collected.  07-23-20: chest x-ray:No active disease. Aortic Atherosclerosis      LABS REVIEWED PREVIOUS  10-13-19: hgb a1c 6.0; vit B 12: 1126 folate 20.9 10-17-19: wbc 8.8; hgb 8.3; hct 26.1; mcv 114.0 plt 270; glucose 190; bun 28; creat 4.85; k+ 3.9; na++ 127; ca 8.9 liver normal albumin 4.0  10-31-19: wbc 6.0; hgb 8.8; hct 28.6; mcv 117.2 plt 326; glucose 188; bun 45; creat 6.28; k+ 3.9; na++ 137;ca 9.1 liver normal albumin 3.9 11-07-19: wbc 6.1; hgb 9.2; hct 29.4 mcv 115.7 plt 276; glucose 201; bun 44; creat 5.93; k+ 4.5; na++ 136; ca 9.1 liver normal albumin 3.8 11-14-19; wbc 8.3; hgb 9.7; hct 31.2 mcv 116.4 plt 261; glucose 198; bun 44; creat 6.66; k+ 5.1; na++ 138; ca 8.8 liver normal albumin 3.9   12-12-19: wbc 7.6; hgb 11.3; hct 35.6; mcv 109.9 plt 212; glucose 602; bun 67; creat 8.62; k+ 5.6; na++ 135; ca 7.9 liver normal albumin 3.5  12-20-19: hgb a1c 8.0  01-09-20: wbc 6.9; hgb 12.1; hct 39.7; mcv 105.6 plt 299; glucose 225; bun 112; creat 14.53; k+ 6.4; na++ 132; ca 8.3 liver normal albumin 3.5  01-23-20: k+ 6.6  01-23-20: wbc 7.9; hgb 11.6; hct 36.9; mcv 103.7 plt 359; glucose 139; bun 109; creat 15.46; k+ 6.1; na++ 140; ca 8.7 liver normal albumin 4.1;  01-24-20: k+ 4.4  01-30-20: wbc 10.6; hgb 9.7; hct 32.0; mcv 103.6 plt 390; glucose 164; bun 44; creat 9.08; k+ 4.1; na++ 139; ca 9.0 liver normal albumin 3.3 02-06-20: wbc 7.8; hgb 9.9; hct 32.7 mcv 104.8 plt 344; glucose 211; bun 90;  creat 10.97; k+ 6.0 na++ 137; ca 8.8 liver normal albumin 3.4  03-26-20: wbc 6.3; hgb 9.4; hct 30.4; mcv 104.1; plt 300; glucose 223; bun 101; creat 14.22; k+ 5.8; na++ 135; ca 8.1 GFR 2; liver normal albumin 3.4  05-21-20; wbc 5.1; hgb 10.4; hct 32.8; mcv 106.8 plt 303; glucose 186; bun 41; creat 1.54; k+ 5.0; na++ 132; ca 8.4 GFR 5; liver normal albumin 3.7 06-13-20: chol 189; ldl 106; trig 211; hdl 41; hgb a1c 6.2   07-11-20: wbc 9.1; hgb 10.6; hct 34.6; mcv 109.1 plt 248; glucose 79; bun 45; creat 7.71 k+ 4.9; na++ 134; ca 8.1 GFR 5; liver normal albumin 3.5 mag 2.4 07-13-20: wbc 11.4; hgb 8.9; hct 29.7; mcv 108.8 plt 303; glucose 178; bun 66; creat 11.17; k+ 6.6; na++ 135; ca 8.2; GFR 3 07-15-20: wbc 11.0; hgb 7.5; hct 25.2; mcv 108.2 plt 251; glucose 117; bun 60; creat 9.41; k+ 5.5; na++ 138; ca 8.4 GFR 4 07-17-20: wbc 11.5; hgb 9.4; hct 29.8; mcv 100.3 plt 300; glucose 140; bun 68; creat 8.04; k+ 4.2; na++ 135; ca 8.5 GFR 5 07-22-20: wbc 11.6; hgb 7.1; hct 23.5; mcv 104.0 plt 444; glucose 214; bun 97; creat 9.18; k+ 4.5; na++ 140; ca 8.0; GFR4 07-24-20: wbc 14.3; hgb 6.7; hct 21.2; mcv 99.5 plt 385; glucose 96; bun 49; creat 6.55; k+ 4.7; na++ 133; ca 8.0; GFR4; mag 2.0 phos 3.8 07-26-20: wbc 10.4; hgb 8.5; hct 26.8; mcv 98.9 plt 430; glucose 111; bun 40;creat 6.90; k+ 4.2; na++ 133; ca 8.5; GFR 6; mag 2.2; phos 3.3     TODAY  07-30-20: wbc 7.6; hgb 7.3; hct 24.1; mcv 103.4 plt 345; glucose 97; bun 26;  creat 5.89; k+ 4.1; na++ 139; ca 8.6; GFR 7; liver normal albumin 2.8.    Review of Systems  Constitutional: Negative for malaise/fatigue.  Respiratory: Negative for cough and shortness of breath.   Cardiovascular: Negative for chest pain, palpitations and leg swelling.  Gastrointestinal: Negative for abdominal pain, constipation and heartburn.  Musculoskeletal: Positive for joint pain. Negative for back pain and myalgias.       Right hip pain   Skin: Negative.   Neurological: Negative for dizziness.   Psychiatric/Behavioral: The patient is not nervous/anxious.       Physical Exam Constitutional:      General: She is not in acute distress.    Appearance: She is well-developed. She is not diaphoretic.  Neck:     Thyroid: No thyromegaly.  Cardiovascular:     Rate and Rhythm: Normal rate and regular rhythm.     Heart sounds: Normal heart sounds.  Pulmonary:     Effort: Pulmonary effort is normal. No respiratory distress.     Breath sounds: Normal breath sounds.  Abdominal:     General: Bowel sounds are normal. There is no distension.     Palpations: Abdomen is soft.     Tenderness: There is no abdominal tenderness.  Musculoskeletal:     Cervical back: Neck supple.     Right lower leg: No edema.     Left lower leg: No edema.     Comments: Left neck dialysis access Right arm ace wrap on     Right hip IM nail 07-12-20      Lymphadenopathy:     Cervical: No cervical adenopathy.  Skin:    General: Skin is warm and dry.  Neurological:     Mental Status: She is alert. Mental status is at baseline.  Psychiatric:        Mood and Affect: Mood normal.        ASSESSMENT/ PLAN:  TODAY  1. Protein calorie malnutrition severe:  Is worse will begin prostat 30 mL three times daily  Will monitor her status.      Ok Edwards NP [facility]  Contact [phone] Monday through Friday 8am- 5pm  After hours call [phone]

## 2020-07-30 NOTE — Progress Notes (Signed)
Patient was assessed by Dr. Katragadda and labs have been reviewed.  Patient is okay to proceed with treatment today. Primary RN and pharmacy aware.   

## 2020-07-30 NOTE — Patient Instructions (Signed)
[facility]  Discharge Instructions: Thank you for choosing [facility] to provide your oncology and hematology care.  If you have a lab appointment with the Cancer Center, please come in thru the Main Entrance and check in at the main information desk.  Wear comfortable clothing and clothing appropriate for easy access to any Portacath or PICC line.   We strive to give you quality time with your provider. You may need to reschedule your appointment if you arrive late (15 or more minutes).  Arriving late affects you and other patients whose appointments are after yours.  Also, if you miss three or more appointments without notifying the office, you may be dismissed from the clinic at the provider's discretion.      For prescription refill requests, have your pharmacy contact our office and allow 72 hours for refills to be completed.    Today you received Velcade injection    To help prevent nausea and vomiting after your treatment, we encourage you to take your nausea medication as directed.  BELOW ARE SYMPTOMS THAT SHOULD BE REPORTED IMMEDIATELY: . *FEVER GREATER THAN 100.4 F (38 C) OR HIGHER . *CHILLS OR SWEATING . *NAUSEA AND VOMITING THAT IS NOT CONTROLLED WITH YOUR NAUSEA MEDICATION . *UNUSUAL SHORTNESS OF BREATH . *UNUSUAL BRUISING OR BLEEDING . *URINARY PROBLEMS (pain or burning when urinating, or frequent urination) . *BOWEL PROBLEMS (unusual diarrhea, constipation, pain near the anus) . TENDERNESS IN MOUTH AND THROAT WITH OR WITHOUT PRESENCE OF ULCERS (sore throat, sores in mouth, or a toothache) . UNUSUAL RASH, SWELLING OR PAIN  . UNUSUAL VAGINAL DISCHARGE OR ITCHING   Items with * indicate a potential emergency and should be followed up as soon as possible or go to the Emergency Department if any problems should occur.  Please show the CHEMOTHERAPY ALERT CARD or IMMUNOTHERAPY ALERT CARD at check-in to the Emergency Department and triage  nurse.  Should you have questions after your visit or need to cancel or reschedule your appointment, please contact [facility] [phone]  and follow the prompts.  Office hours are 8:00 a.m. to 4:30 p.m. Monday - Friday. Please note that voicemails left after 4:00 p.m. may not be returned until the following business day.  We are closed weekends and major holidays. You have access to a nurse at all times for urgent questions. Please call the main number to the clinic [phone] and follow the prompts.  For any non-urgent questions, you may also contact your provider using MyChart. We now offer e-Visits for anyone 18 and older to request care online for non-urgent symptoms. For details visit [email or web address].   Also download the MyChart app! Go to the app store, search "MyChart", open the app, select [facility], and log in with your MyChart username and password.  Due to Covid, a mask is required upon entering the hospital/clinic. If you do not have a mask, one will be given to you upon arrival. For doctor visits, patients may have 1 support person aged 18 or older with them. For treatment visits, patients cannot have anyone with them due to current Covid guidelines and our immunocompromised population.  

## 2020-07-30 NOTE — Patient Instructions (Signed)
[place] at [facility] Discharge Instructions  You were seen today by Dr. Delton Coombes. He went over your recent results, and you received treatment. Dr. Delton Coombes will see you back in 4 weeks for labs and follow up.   Thank you for choosing [place] at [facility] to provide your oncology and hematology care.  To afford each patient quality time with our provider, please arrive at least 15 minutes before your scheduled appointment time.   If you have a lab appointment with the [facility] please come in thru the Main Entrance and check in at the main information desk  You need to re-schedule your appointment should you arrive 10 or more minutes late.  We strive to give you quality time with our providers, and arriving late affects you and other patients whose appointments are after yours.  Also, if you no show three or more times for appointments you may be dismissed from the clinic at the providers discretion.     Again, thank you for choosing [facility].  Our hope is that these requests will decrease the amount of time that you wait before being seen by our physicians.       _____________________________________________________________  Should you have questions after your visit to [facility], please contact our office at [phone] between the hours of 8:00 a.m. and 4:30 p.m.  Voicemails left after 4:00 p.m. will not be returned until the following business day.  For prescription refill requests, have your pharmacy contact our office and allow 72 hours.    Cancer Center Support Programs:   > Cancer Support Group  2nd Tuesday of the month 1pm-2pm, Journey Room

## 2020-07-30 NOTE — Progress Notes (Signed)
Patient presents today for Velcade injection.  Vital signs and labs within parameters for treatment.   Patient has no new complaints since last visit other than hip soreness from recent surgery.   Message received from Dr. Delton Coombes okay to proceed with treatment.  Velcade injection given today per MD orders.  Stable during infusion without adverse affects.  Vital signs stable.  No complaints at this time.  Discharge from clinic via wheelchair in stable condition.  Alert and oriented X 3.  Follow up with [facility] as scheduled.

## 2020-07-31 ENCOUNTER — Encounter: Payer: Self-pay | Admitting: Internal Medicine

## 2020-07-31 ENCOUNTER — Non-Acute Institutional Stay (SKILLED_NURSING_FACILITY): Payer: Medicare PPO | Admitting: Internal Medicine

## 2020-07-31 DIAGNOSIS — D631 Anemia in chronic kidney disease: Secondary | ICD-10-CM

## 2020-07-31 DIAGNOSIS — C9000 Multiple myeloma not having achieved remission: Secondary | ICD-10-CM | POA: Diagnosis not present

## 2020-07-31 DIAGNOSIS — N186 End stage renal disease: Secondary | ICD-10-CM | POA: Diagnosis not present

## 2020-07-31 DIAGNOSIS — Z992 Dependence on renal dialysis: Secondary | ICD-10-CM | POA: Diagnosis not present

## 2020-07-31 DIAGNOSIS — K259 Gastric ulcer, unspecified as acute or chronic, without hemorrhage or perforation: Secondary | ICD-10-CM

## 2020-07-31 DIAGNOSIS — E43 Unspecified severe protein-calorie malnutrition: Secondary | ICD-10-CM | POA: Insufficient documentation

## 2020-07-31 DIAGNOSIS — Z23 Encounter for immunization: Secondary | ICD-10-CM | POA: Diagnosis not present

## 2020-07-31 LAB — KAPPA/LAMBDA LIGHT CHAINS
Kappa free light chain: 165.2 mg/L — ABNORMAL HIGH (ref 3.3–19.4)
Kappa, lambda light chain ratio: 0.55 (ref 0.26–1.65)
Lambda free light chains: 301.6 mg/L — ABNORMAL HIGH (ref 5.7–26.3)

## 2020-07-31 MED ORDER — FULVESTRANT 250 MG/5ML IM SOLN
INTRAMUSCULAR | Status: AC
  Filled 2020-07-31: qty 10

## 2020-07-31 NOTE — Assessment & Plan Note (Signed)
Seen 07/30/20 H/H 7.3/24.1; MCV 103.4

## 2020-07-31 NOTE — Patient Instructions (Signed)
See assessment and plan under each diagnosis in the problem list and acutely for this visit 

## 2020-07-31 NOTE — Assessment & Plan Note (Addendum)
Zollinger-Ellison evaluation as per GI; no current gastrin level in Epic Secretan Stimulation test not possible on PPIs

## 2020-07-31 NOTE — Progress Notes (Signed)
NURSING HOME LOCATION: [facility] ROOM NUMBER:  [id number]  CODE STATUS:    PCP:  Bard Herbert NP  This is a nursing facility follow up visit for [place] readmission within 30 days  Interim medical record and care since last SNF visit was updated with review of diagnostic studies and change in clinical status since last visit were documented.  HPI: She was rehospitalized 5/16/5/20/2022 with rectal bleeding, melenous stools as well as hematemesis in the context of history of multiple gastric ulcers.   Hemoglobin was 9.4 on 5/11 but dropped to 7.1 the day of admission.  FOB was positive.  1 unit of packed red cells was transfused and hemoglobin stabilized at 8.5.  She was started on IV Protonix infusion.  GI was consulted and EGD performed 5/17 revealing gastric and duodenal ulcers.   Nephrology prescribed Procrit/Epogen and continued hemodialysis as per maintenance schedule. Soft blood pressures improved following the PRBC transfusion and IV fluids.  Prior to stabilization; blood pressure medicines were adjusted based on readings. Staff at the SNF had also noted a mildly productive cough for several days PTA with production of whitish sputum without associated fever or chills.CXR 5/17 revealed NAD. Sliding scale insulin was continued while hospitalized. At discharge Carafate and Protonix were continued as maintenance medicines.  Hemodialysis was to be continued Monday, Wednesday, and Fridays. Repeat BMP and CBC was to be performed 5/23; these are done at HD.  Gastroenterology follow-up with Dr. Laural Golden for repeat EGD to document healing of gastric &  duodenal ulcers was to be in approximately 8 weeks.  Review of systems: Dementia invalidated responses.  She was unaware that she had been back in the hospital for the GI bleed.  When I asked why she had been hospitalized, her response was "I broke my leg".  She denied any active GI symptoms at this time.  She had no memory  of seeing her Hematologist earlier this month.  Her only complaint at this time is pain in the right knee.  Gastrointestinal: No heartburn, dysphagia, abdominal pain, nausea /vomiting, rectal bleeding, melena, change in bowels Hematologic/lymphatic: No significant bruising, lymphadenopathy, abnormal bleeding   Physical exam:  Pertinent or positive findings: She had just returned from dialysis and was still in the wheelchair.  She was obviously confused.  She exhibits intermittent alternating exotropia.  Dental hygiene is surprisingly good.  Breath sounds are decreased.  She has a grade 1.5 systolic murmur at the base.  Abdomen is protuberant.  She has nonpitting edema of the lower extremities.  Pedal pulses are decreased.No R knee effusion appreciated.  She is wearing an antiwandering bracelet on the left ankle.  General appearance: Adequately nourished; no acute distress, increased work of breathing is present.   Lymphatic: No lymphadenopathy about the head, neck, axilla. Eyes: No conjunctival inflammation or lid edema is present. There is no scleral icterus. Ears:  External ear exam shows no significant lesions or deformities.   Nose:  External nasal examination shows no deformity or inflammation. Nasal mucosa are pink and moist without lesions, exudates Oral exam:  Lips and gums are healthy appearing. There is no oropharyngeal erythema or exudate. Neck:  No thyromegaly, masses, tenderness noted.    Heart:  Normal rate and regular rhythm. S1 and S2 normal without gallop,  click, rub .  Lungs:  without wheezes, rhonchi, rales, rubs. Abdomen: Bowel sounds are normal. Abdomen is soft and nontender with no organomegaly, hernias, masses. GU: Deferred  Extremities:  No  cyanosis, clubbing  Neurologic exam :Balance, Rhomberg, finger to nose testing could not be completed due to clinical state Skin: Warm & dry w/o tenting. No significant lesions or rash.  See summary under each active problem in the  Problem List with associated updated therapeutic plan

## 2020-08-01 LAB — PROTEIN ELECTROPHORESIS, SERUM
A/G Ratio: 1.5 (ref 0.7–1.7)
Albumin ELP: 3.4 g/dL (ref 2.9–4.4)
Alpha-1-Globulin: 0.3 g/dL (ref 0.0–0.4)
Alpha-2-Globulin: 0.8 g/dL (ref 0.4–1.0)
Beta Globulin: 0.8 g/dL (ref 0.7–1.3)
Gamma Globulin: 0.4 g/dL (ref 0.4–1.8)
Globulin, Total: 2.3 g/dL (ref 2.2–3.9)
Total Protein ELP: 5.7 g/dL — ABNORMAL LOW (ref 6.0–8.5)

## 2020-08-01 MED ORDER — OCTREOTIDE ACETATE 20 MG IM KIT
PACK | INTRAMUSCULAR | Status: AC
  Filled 2020-08-01: qty 1

## 2020-08-01 MED ORDER — OCTREOTIDE ACETATE 30 MG IM KIT
PACK | INTRAMUSCULAR | Status: AC
  Filled 2020-08-01: qty 1

## 2020-08-02 DIAGNOSIS — N186 End stage renal disease: Secondary | ICD-10-CM | POA: Diagnosis not present

## 2020-08-02 DIAGNOSIS — Z23 Encounter for immunization: Secondary | ICD-10-CM | POA: Diagnosis not present

## 2020-08-02 DIAGNOSIS — Z992 Dependence on renal dialysis: Secondary | ICD-10-CM | POA: Diagnosis not present

## 2020-08-02 MED ORDER — ACETAMINOPHEN 325 MG PO TABS
ORAL_TABLET | ORAL | Status: AC
  Filled 2020-08-02: qty 2

## 2020-08-02 MED ORDER — DENOSUMAB 120 MG/1.7ML ~~LOC~~ SOLN
SUBCUTANEOUS | Status: AC
  Filled 2020-08-02: qty 1.7

## 2020-08-02 MED ORDER — LORATADINE 10 MG PO TABS
ORAL_TABLET | ORAL | Status: AC
  Filled 2020-08-02: qty 1

## 2020-08-05 DIAGNOSIS — Z23 Encounter for immunization: Secondary | ICD-10-CM | POA: Diagnosis not present

## 2020-08-05 DIAGNOSIS — N186 End stage renal disease: Secondary | ICD-10-CM | POA: Diagnosis not present

## 2020-08-05 DIAGNOSIS — Z992 Dependence on renal dialysis: Secondary | ICD-10-CM | POA: Diagnosis not present

## 2020-08-06 ENCOUNTER — Inpatient Hospital Stay (HOSPITAL_COMMUNITY): Payer: Medicare PPO

## 2020-08-06 DIAGNOSIS — N186 End stage renal disease: Secondary | ICD-10-CM | POA: Diagnosis not present

## 2020-08-06 DIAGNOSIS — Z9181 History of falling: Secondary | ICD-10-CM | POA: Diagnosis not present

## 2020-08-06 DIAGNOSIS — Z992 Dependence on renal dialysis: Secondary | ICD-10-CM | POA: Diagnosis not present

## 2020-08-06 DIAGNOSIS — M6281 Muscle weakness (generalized): Secondary | ICD-10-CM | POA: Diagnosis not present

## 2020-08-06 DIAGNOSIS — S72141D Displaced intertrochanteric fracture of right femur, subsequent encounter for closed fracture with routine healing: Secondary | ICD-10-CM | POA: Diagnosis not present

## 2020-08-06 DIAGNOSIS — R262 Difficulty in walking, not elsewhere classified: Secondary | ICD-10-CM | POA: Diagnosis not present

## 2020-08-07 DIAGNOSIS — R2681 Unsteadiness on feet: Secondary | ICD-10-CM | POA: Diagnosis not present

## 2020-08-07 DIAGNOSIS — Z992 Dependence on renal dialysis: Secondary | ICD-10-CM | POA: Diagnosis not present

## 2020-08-07 DIAGNOSIS — M6281 Muscle weakness (generalized): Secondary | ICD-10-CM | POA: Diagnosis not present

## 2020-08-07 DIAGNOSIS — R262 Difficulty in walking, not elsewhere classified: Secondary | ICD-10-CM | POA: Diagnosis not present

## 2020-08-07 DIAGNOSIS — S72141D Displaced intertrochanteric fracture of right femur, subsequent encounter for closed fracture with routine healing: Secondary | ICD-10-CM | POA: Diagnosis not present

## 2020-08-07 DIAGNOSIS — Z9181 History of falling: Secondary | ICD-10-CM | POA: Diagnosis not present

## 2020-08-07 DIAGNOSIS — N186 End stage renal disease: Secondary | ICD-10-CM | POA: Diagnosis not present

## 2020-08-08 ENCOUNTER — Encounter: Payer: Self-pay | Admitting: Adult Health

## 2020-08-08 ENCOUNTER — Non-Acute Institutional Stay (SKILLED_NURSING_FACILITY): Payer: Medicare PPO | Admitting: Adult Health

## 2020-08-08 DIAGNOSIS — F0151 Vascular dementia with behavioral disturbance: Secondary | ICD-10-CM | POA: Diagnosis not present

## 2020-08-08 DIAGNOSIS — S72001S Fracture of unspecified part of neck of right femur, sequela: Secondary | ICD-10-CM | POA: Diagnosis not present

## 2020-08-08 DIAGNOSIS — E1122 Type 2 diabetes mellitus with diabetic chronic kidney disease: Secondary | ICD-10-CM | POA: Diagnosis not present

## 2020-08-08 DIAGNOSIS — Z992 Dependence on renal dialysis: Secondary | ICD-10-CM

## 2020-08-08 DIAGNOSIS — F01518 Vascular dementia, unspecified severity, with other behavioral disturbance: Secondary | ICD-10-CM

## 2020-08-08 DIAGNOSIS — N186 End stage renal disease: Secondary | ICD-10-CM | POA: Diagnosis not present

## 2020-08-08 DIAGNOSIS — I70209 Unspecified atherosclerosis of native arteries of extremities, unspecified extremity: Secondary | ICD-10-CM

## 2020-08-08 NOTE — Progress Notes (Signed)
Location:  [facility] Room Number: 148-D Place of Service:  SNF (31)   CODE STATUS: Full  Allergies  Allergen Reactions  . Ace Inhibitors Other (See Comments) and Cough    Tongue swell , ie angioedema  . Angiotensin Receptor Blockers     Angioedema with ACE-I  . Penicillins Other (See Comments)    Unsure of reaction Has patient had a PCN reaction causing immediate rash, facial/tongue/throat swelling, SOB or lightheadedness with hypotension: Unknown Has patient had a PCN reaction causing severe rash involving mucus membranes or skin necrosis: Unknown Has patient had a PCN reaction that required hospitalization: No Has patient had a PCN reaction occurring within the last 10 years: Unknown If all of the above answers are "NO", then may proceed with Cephalosporin use.      Chief Complaint  Patient presents with  . Acute Visit    Care plan meeting.    HPI:  We have come together for her care plan meeting family present . BIMS 10/15 mood 8/30: decreased energy. She requires extensive assist with her adls. She is able to feed himself. She is incontinent of bladder and bowel. Falls: 07-08-20: no injury; 07-11-20: fracture right hip: hospitalized from 07-11-20 through 07-17-20. Had GI bleed and hospitalized from 07-21-20 through 07-27-20. cbg readings are stable. Right upper extremity is wrapped. Remains on dialysis. There are no reports of uncontrolled pain   Therapy: Pt/OT: has difficulty with participation. Gets out of bed to wheelchair.    Dietary: has good appetite 143 pounds is stable is on supplement.    Will continue to be followed for her chronic illnesses including:  Atherosclerotic peripheral vascular disease  Vascular dementia with behavioral disturbance  Chronic kidney disease with end stage renal disease on dialysis due to type 2 diabetes mellitus:   Past Medical History:  Diagnosis Date  . Anemia     chronic macrocytic anemia  . Anxiety   . Chronic kidney  disease   . Chronic renal disease, stage 4, severely decreased glomerular filtration rate (GFR) between 15-29 mL/min/1.73 square meter (HCC) 08/22/2015  . Complication of anesthesia    delirious after Breast Surgery  . Dementia ([place])    mild  . Depression   . Diabetes mellitus with ESRD (end-stage renal disease) ([place])    type II  . Dyspnea    with activity  . GERD (gastroesophageal reflux disease)   . Glaucoma   . Hypertension   . Pneumonia   . Stage 1 infiltrating ductal carcinoma of right female breast ([place]) 08/21/2015   ER+ PR+ HER 2 neu + (3+) T1cN0     Past Surgical History:  Procedure Laterality Date  . A/V FISTULAGRAM Right 07/05/2020   Procedure: A/V Fistulagram;  Surgeon: Elam Dutch, MD;  Location: [facility];  Service: Cardiovascular;  Laterality: Right;  . AV FISTULA PLACEMENT Left 11/22/2017   Procedure: ARTERIOVENOUS (AV) FISTULA CREATION LEFT ARM;  Surgeon: Elam Dutch, MD;  Location: [facility];  Service: Vascular;  Laterality: Left;  . AV FISTULA PLACEMENT Right 04/04/2020   Procedure: RIGHT ARM ARTERIOVENOUS FISTULA CREATION;  Surgeon: Rosetta Posner, MD;  Location: [facility];  Service: Vascular;  Laterality: Right;  . BIOPSY  08/07/2016   Procedure: BIOPSY;  Surgeon: Daneil Dolin, MD;  [facility];  Service: Endoscopy;;  gastric ulcer biopsy  . COLONOSCOPY    . ESOPHAGOGASTRODUODENOSCOPY N/A 08/07/2016   LA Grade A esophagitis s/p dilation, small hiatal hernia,  multiple gastric ulcers and erosions, duodenal erosions s/p biopsy. Negative H.pylori   . ESOPHAGOGASTRODUODENOSCOPY N/A 11/27/2016   normal esophagus, previously noted gastric ulcers completely healed, normal duodenum.   . ESOPHAGOGASTRODUODENOSCOPY (EGD) WITH PROPOFOL N/A 07/23/2020   Procedure: ESOPHAGOGASTRODUODENOSCOPY (EGD) WITH PROPOFOL;  Surgeon: Rogene Houston, MD;  [facility];  Service: Endoscopy;  Laterality: N/A;  . FISTULA SUPERFICIALIZATION Left 02/14/2018    Procedure: FISTULA SUPERFICIALIZATION LEFT ARM;  Surgeon: Angelia Mould, MD;  Location: [facility];  Service: Vascular;  Laterality: Left;  . FRACTURE SURGERY Right    ankle  . HOT HEMOSTASIS  07/23/2020   Procedure: HOT HEMOSTASIS (ARGON PLASMA COAGULATION/BICAP);  Surgeon: Rogene Houston, MD;  [facility];  Service: Endoscopy;;  . INTRAMEDULLARY (IM) NAIL INTERTROCHANTERIC Right 07/12/2020   Procedure: INTRAMEDULLARY (IM) NAIL INTERTROCHANTRIC;  Surgeon: Mordecai Rasmussen, MD;  Location: [facility];  Service: Orthopedics;  Laterality: Right;  . MALONEY DILATION N/A 08/07/2016   Procedure: Venia Minks DILATION;  Surgeon: Daneil Dolin, MD;  [facility];  Service: Endoscopy;  Laterality: N/A;  . MASTECTOMY, PARTIAL Right   . multiple myeloma    . PERIPHERAL VASCULAR BALLOON ANGIOPLASTY Left 07/13/2019   Procedure: PERIPHERAL VASCULAR BALLOON ANGIOPLASTY;  Surgeon: Marty Heck, MD;  Location: [facility];  Service: Cardiovascular;  Laterality: Left;  arm fistulogram  . PERIPHERAL VASCULAR BALLOON ANGIOPLASTY Right 05/22/2020   Procedure: PERIPHERAL VASCULAR BALLOON ANGIOPLASTY;  Surgeon: Cherre Robins, MD;  Location: [facility];  Service: Cardiovascular;  Laterality: Right;  arm fistula  . PERIPHERAL VASCULAR BALLOON ANGIOPLASTY Right 07/05/2020   Procedure: PERIPHERAL VASCULAR BALLOON ANGIOPLASTY;  Surgeon: Elam Dutch, MD;  Location: [facility];  Service: Cardiovascular;  Laterality: Right;  arm fistula  . PORT-A-CATH REMOVAL Left 11/22/2017   Procedure: REMOVAL PORT-A-CATH LEFT CHEST;  Surgeon: Elam Dutch, MD;  Location: [facility] OR;  Service: Vascular;  Laterality: Left;  . RETINAL DETACHMENT SURGERY Right   . SCLEROTHERAPY  07/23/2020   Procedure: SCLEROTHERAPY;  Surgeon: Rogene Houston, MD;  [facility];  Service: Endoscopy;;    Social History   Socioeconomic History  . Marital status: Single    Spouse name: Not on  file  . Number of children: Not on file  . Years of education: Not on file  . Highest education level: Not on file  Occupational History  . Occupation: retired   Tobacco Use  . Smoking status: Never Smoker  . Smokeless tobacco: Never Used  Vaping Use  . Vaping Use: Never used  Substance and Sexual Activity  . Alcohol use: No    Alcohol/week: 0.0 standard drinks  . Drug use: No  . Sexual activity: Never  Other Topics Concern  . Not on file  Social History Narrative   Long term resident of SNF    Social Determinants of Health   Financial Resource Strain: Low Risk   . Difficulty of Paying Living Expenses: Not very hard  Food Insecurity: No Food Insecurity  . Worried About Charity fundraiser in the Last Year: Never true  . Ran Out of Food in the Last Year: Never true  Transportation Needs: No Transportation Needs  . Lack of Transportation (Medical): No  . Lack of Transportation (Non-Medical): No  Physical Activity: Inactive  . Days of Exercise per Week: 0 days  . Minutes of Exercise per Session: 0 min  Stress: No Stress Concern Present  .  Feeling of Stress : Not at all  Social Connections: Moderately Isolated  . Frequency of Communication with Friends and Family: More than three times a week  . Frequency of Social Gatherings with Friends and Family: Once a week  . Attends Religious Services: More than 4 times per year  . Active Member of Clubs or Organizations: No  . Attends Archivist Meetings: Never  . Marital Status: Never married  Intimate Partner Violence: Not At Risk  . Fear of Current or Ex-Partner: No  . Emotionally Abused: No  . Physically Abused: No  . Sexually Abused: No   Family History  Problem Relation Age of Onset  . Multiple myeloma Sister   . Brain cancer Sister   . Dementia Mother        died at 22  . Stroke Mother   . Heart failure Mother   . Diabetes Mother   . Heart disease Father   . Prostate cancer Brother   . Colon cancer Neg  Hx       VITAL SIGNS BP (!) 151/51   Pulse 76   Temp 98.6 F (37 C)   Resp 20   Ht '5\' 3"'  (1.6 m)   Wt 143 lb 3.2 oz (65 kg)   SpO2 97%   BMI 25.37 kg/m   Outpatient Encounter Medications as of 08/08/2020  Medication Sig  . acetaminophen (TYLENOL) 650 MG CR tablet Take 650 mg by mouth every 6 (six) hours.  Marland Kitchen acyclovir (ZOVIRAX) 200 MG capsule Take 200 mg by mouth daily.  . Amino Acids-Protein Hydrolys (FEEDING SUPPLEMENT, PRO-STAT SUGAR FREE 64,) LIQD Take 30 mLs by mouth 3 (three) times daily with meals.  Marland Kitchen anastrozole (ARIMIDEX) 1 MG tablet TAKE 1 TABLET BY MOUTH DAILY (Patient taking differently: Take 1 mg by mouth daily.)  . atenolol (TENORMIN) 25 MG tablet Take 1 tablet (25 mg total) by mouth daily.  Marland Kitchen atorvastatin (LIPITOR) 10 MG tablet Take 10 mg by mouth at bedtime.  Roseanne Kaufman [place]-Castor Oil (VENELEX) OINT Apply topically. Apply to bilateral buttocks and sacrum every shift.  . Calcium Acetate 667 MG TABS Take 667 mg by mouth daily. Take 1 tablet w/ snacks daily  . Calcium Acetate 667 MG TABS Take 1,334 mg by mouth in the morning and at bedtime. 2 tabs with meals  . calcium-vitamin D (OSCAL WITH D) 500-200 MG-UNIT tablet Take 1 tablet by mouth daily. At 8 am  . cloNIDine (CATAPRES) 0.1 MG tablet Take 1 tablet (0.1 mg total) by mouth at bedtime. Hold for SBP < 110 bpm  . Insulin Aspart (NOVOLOG FLEXPEN [place]) Inject 5 Units into the skin 3 (three) times daily with meals. For CBG >150  . insulin glargine (LANTUS SOLOSTAR) 100 UNIT/ML Solostar Pen Inject 10 Units into the skin at bedtime.  . melatonin 3 MG TABS tablet Take 6 mg by mouth at bedtime. For Sleep  . NON FORMULARY Diet - renal carb modified 1200cc fluid restriction  Special Instructions: 1200cc fluid restriction. Dietary to provide 420cc/24 hours, 7-3= 360cc/24hrs, 3-11=320cc/24hrs, 11-7=100cc/24hrs.  . ondansetron (ZOFRAN) 4 MG tablet Take 1 tablet (4 mg total) by mouth every 6 (six) hours as needed for nausea.  .  pantoprazole (PROTONIX) 40 MG tablet Take 1 tablet (40 mg total) by mouth 2 (two) times daily before a meal.  . sertraline (ZOLOFT) 50 MG tablet Take 50 mg by mouth daily.  . Sucralfate (CARAFATE PO) Take by mouth. suspension; 100 mg/mL; amt: 10 ml; oral Special  Instructions: Give with meals and at bedtime Four Times A Day; 08:00 AM, 12:00 PM, 05:00 PM, 09:00 PM  . [DISCONTINUED] Multiple Vitamin (MULTIVITAMIN) capsule Take 1 capsule by mouth daily.   No facility-administered encounter medications on file as of 08/08/2020.     SIGNIFICANT DIAGNOSTIC EXAMS  PREVIOUS   07-11-20: chest x-ray:  Central catheter tip at cavoatrial junction. No pneumothorax. Mild left base atelectasis. No edema or airspace opacity. Stable cardiac silhouette. Aortic Atherosclerosis   07-11-20: right knee x-ray:  No acute fracture or dislocation is noted. Diffuse vascular calcifications are seen. No soft tissue abnormality is noted.  07-11-20: right hip x-ray:  Acute, impacted, angulated right basicervical femoral neck fracture.   07-23-20: upper endoscopy:  Normal hypopharynx. - Normal esophagus. - Z-line irregular, 34 cm from the incisors. - Non-bleeding gastric ulcers with no stigmata of bleeding. - Duodenal ulcer with stigmata of bleed/ pigmented material. Gold probe therapy was not successful and hemostasis achieved with injection of dilute epinephrine. - Non-bleeding duodenal ulcer with no stigmata of bleeding(second bulbar ulcer) - Normal second portion of the duodenum. - No specimens collected.  07-23-20: chest x-ray:No active disease. Aortic Atherosclerosis   NO NEW EXAMS.      LABS REVIEWED PREVIOUS  10-13-19: hgb a1c 6.0; vit B 12: 1126 folate 20.9 10-17-19: wbc 8.8; hgb 8.3; hct 26.1; mcv 114.0 plt 270; glucose 190; bun 28; creat 4.85; k+ 3.9; na++ 127; ca 8.9 liver normal albumin 4.0  10-31-19: wbc 6.0; hgb 8.8; hct 28.6; mcv 117.2 plt 326; glucose 188; bun 45; creat 6.28; k+ 3.9; na++ 137;ca 9.1  liver normal albumin 3.9 11-07-19: wbc 6.1; hgb 9.2; hct 29.4 mcv 115.7 plt 276; glucose 201; bun 44; creat 5.93; k+ 4.5; na++ 136; ca 9.1 liver normal albumin 3.8 11-14-19; wbc 8.3; hgb 9.7; hct 31.2 mcv 116.4 plt 261; glucose 198; bun 44; creat 6.66; k+ 5.1; na++ 138; ca 8.8 liver normal albumin 3.9   12-12-19: wbc 7.6; hgb 11.3; hct 35.6; mcv 109.9 plt 212; glucose 602; bun 67; creat 8.62; k+ 5.6; na++ 135; ca 7.9 liver normal albumin 3.5  12-20-19: hgb a1c 8.0  01-09-20: wbc 6.9; hgb 12.1; hct 39.7; mcv 105.6 plt 299; glucose 225; bun 112; creat 14.53; k+ 6.4; na++ 132; ca 8.3 liver normal albumin 3.5  01-23-20: k+ 6.6  01-23-20: wbc 7.9; hgb 11.6; hct 36.9; mcv 103.7 plt 359; glucose 139; bun 109; creat 15.46; k+ 6.1; na++ 140; ca 8.7 liver normal albumin 4.1;  01-24-20: k+ 4.4  01-30-20: wbc 10.6; hgb 9.7; hct 32.0; mcv 103.6 plt 390; glucose 164; bun 44; creat 9.08; k+ 4.1; na++ 139; ca 9.0 liver normal albumin 3.3 02-06-20: wbc 7.8; hgb 9.9; hct 32.7 mcv 104.8 plt 344; glucose 211; bun 90; creat 10.97; k+ 6.0 na++ 137; ca 8.8 liver normal albumin 3.4  03-26-20: wbc 6.3; hgb 9.4; hct 30.4; mcv 104.1; plt 300; glucose 223; bun 101; creat 14.22; k+ 5.8; na++ 135; ca 8.1 GFR 2; liver normal albumin 3.4  05-21-20; wbc 5.1; hgb 10.4; hct 32.8; mcv 106.8 plt 303; glucose 186; bun 41; creat 1.54; k+ 5.0; na++ 132; ca 8.4 GFR 5; liver normal albumin 3.7 06-13-20: chol 189; ldl 106; trig 211; hdl 41; hgb a1c 6.2   07-11-20: wbc 9.1; hgb 10.6; hct 34.6; mcv 109.1 plt 248; glucose 79; bun 45; creat 7.71 k+ 4.9; na++ 134; ca 8.1 GFR 5; liver normal albumin 3.5 mag 2.4 07-13-20: wbc 11.4; hgb 8.9; hct 29.7; mcv 108.8  plt 303; glucose 178; bun 66; creat 11.17; k+ 6.6; na++ 135; ca 8.2; GFR 3 07-15-20: wbc 11.0; hgb 7.5; hct 25.2; mcv 108.2 plt 251; glucose 117; bun 60; creat 9.41; k+ 5.5; na++ 138; ca 8.4 GFR 4 07-17-20: wbc 11.5; hgb 9.4; hct 29.8; mcv 100.3 plt 300; glucose 140; bun 68; creat 8.04; k+ 4.2; na++ 135; ca  8.5 GFR 5 07-22-20: wbc 11.6; hgb 7.1; hct 23.5; mcv 104.0 plt 444; glucose 214; bun 97; creat 9.18; k+ 4.5; na++ 140; ca 8.0; GFR4 07-24-20: wbc 14.3; hgb 6.7; hct 21.2; mcv 99.5 plt 385; glucose 96; bun 49; creat 6.55; k+ 4.7; na++ 133; ca 8.0; GFR4; mag 2.0 phos 3.8 07-26-20: wbc 10.4; hgb 8.5; hct 26.8; mcv 98.9 plt 430; glucose 111; bun 40;creat 6.90; k+ 4.2; na++ 133; ca 8.5; GFR 6; mag 2.2; phos 3.3    07-30-20: wbc 7.6; hgb 7.3; hct 24.1; mcv 103.4 plt 345; glucose 97; bun 26; creat 5.89; k+ 4.1; na++ 139; ca 8.6; GFR 7; liver normal albumin 2.8.   NO NEW LABS.   Review of Systems  Constitutional: Negative for malaise/fatigue.  Respiratory: Negative for cough and shortness of breath.   Cardiovascular: Negative for chest pain, palpitations and leg swelling.  Gastrointestinal: Negative for abdominal pain, constipation and heartburn.  Musculoskeletal: Negative for back pain, joint pain and myalgias.  Skin: Negative.   Neurological: Negative for dizziness.  Psychiatric/Behavioral: The patient is not nervous/anxious.    Physical Exam Constitutional:      General: She is not in acute distress.    Appearance: She is well-developed. She is not diaphoretic.  Neck:     Thyroid: No thyromegaly.  Cardiovascular:     Rate and Rhythm: Normal rate and regular rhythm.     Pulses: Normal pulses.     Heart sounds: Normal heart sounds.  Pulmonary:     Effort: Pulmonary effort is normal. No respiratory distress.     Breath sounds: Normal breath sounds.  Abdominal:     General: Bowel sounds are normal. There is no distension.     Palpations: Abdomen is soft.     Tenderness: There is no abdominal tenderness.  Musculoskeletal:     Cervical back: Neck supple.     Right lower leg: No edema.     Left lower leg: No edema.     Comments: Left neck dialysis access Right arm ace wrap on     Right hip IM nail 07-12-20    Lymphadenopathy:     Cervical: No cervical adenopathy.  Skin:    General: Skin is  warm and dry.  Neurological:     Mental Status: She is alert. Mental status is at baseline.  Psychiatric:        Mood and Affect: Mood normal.       ASSESSMENT/ PLAN:  TODAY  1. Atherosclerotic peripheral vascular disease 2. Vascular dementia with behavioral disturbance 3. Chronic kidney disease with end stage renal disease on dialysis due to type 2 diabetes mellitus:   Will continue current medications Will continue therapy as directed  Will continue current plan of care Will monitor her status.   Will begin DNR   Time spent with patient 40 minutes: coordination of care; therapy and medications; goals of care.      Ok Edwards NP [facility]  Contact [phone] Monday through Friday 8am- 5pm  After hours call [phone]

## 2020-08-09 ENCOUNTER — Telehealth: Payer: Self-pay

## 2020-08-09 DIAGNOSIS — Z992 Dependence on renal dialysis: Secondary | ICD-10-CM | POA: Diagnosis not present

## 2020-08-09 DIAGNOSIS — M6281 Muscle weakness (generalized): Secondary | ICD-10-CM | POA: Diagnosis not present

## 2020-08-09 DIAGNOSIS — R262 Difficulty in walking, not elsewhere classified: Secondary | ICD-10-CM | POA: Diagnosis not present

## 2020-08-09 DIAGNOSIS — Z9181 History of falling: Secondary | ICD-10-CM | POA: Diagnosis not present

## 2020-08-09 DIAGNOSIS — R2681 Unsteadiness on feet: Secondary | ICD-10-CM | POA: Diagnosis not present

## 2020-08-09 DIAGNOSIS — S72141D Displaced intertrochanteric fracture of right femur, subsequent encounter for closed fracture with routine healing: Secondary | ICD-10-CM | POA: Diagnosis not present

## 2020-08-09 DIAGNOSIS — N186 End stage renal disease: Secondary | ICD-10-CM | POA: Diagnosis not present

## 2020-08-09 NOTE — Telephone Encounter (Signed)
Davita called to report that patient's right arm is still severely swollen. Patient denies pain today, but apparently usually says it hurts; there is a pulse and she is able to move it. Patient has dementia and is a poor historian. [facility] in an [facility] and they would like her arm evaluated. She is s/p 2 recent fistulgorams due to severe swelling. Per note, unlikely salvageable arm access and consideration of thigh graft. SNF/HD is going to try to consult the family about further planning/thigh graft.. Scheduled follow up appt with [facility] - in the meantime, advised elevation. They are trying this along with a gentle ace wrap. I suspect patient does not comply with keeping arm elevated due to dementia. They will call back sooner if needed.

## 2020-08-10 DIAGNOSIS — R2681 Unsteadiness on feet: Secondary | ICD-10-CM | POA: Diagnosis not present

## 2020-08-10 DIAGNOSIS — S72141D Displaced intertrochanteric fracture of right femur, subsequent encounter for closed fracture with routine healing: Secondary | ICD-10-CM | POA: Diagnosis not present

## 2020-08-10 DIAGNOSIS — M6281 Muscle weakness (generalized): Secondary | ICD-10-CM | POA: Diagnosis not present

## 2020-08-10 DIAGNOSIS — R262 Difficulty in walking, not elsewhere classified: Secondary | ICD-10-CM | POA: Diagnosis not present

## 2020-08-10 DIAGNOSIS — Z9181 History of falling: Secondary | ICD-10-CM | POA: Diagnosis not present

## 2020-08-12 ENCOUNTER — Other Ambulatory Visit (HOSPITAL_COMMUNITY): Payer: Self-pay

## 2020-08-12 DIAGNOSIS — R262 Difficulty in walking, not elsewhere classified: Secondary | ICD-10-CM | POA: Diagnosis not present

## 2020-08-12 DIAGNOSIS — C9000 Multiple myeloma not having achieved remission: Secondary | ICD-10-CM

## 2020-08-12 DIAGNOSIS — R2681 Unsteadiness on feet: Secondary | ICD-10-CM | POA: Diagnosis not present

## 2020-08-12 DIAGNOSIS — M6281 Muscle weakness (generalized): Secondary | ICD-10-CM | POA: Diagnosis not present

## 2020-08-12 DIAGNOSIS — Z992 Dependence on renal dialysis: Secondary | ICD-10-CM | POA: Diagnosis not present

## 2020-08-12 DIAGNOSIS — Z9181 History of falling: Secondary | ICD-10-CM | POA: Diagnosis not present

## 2020-08-12 DIAGNOSIS — S72141D Displaced intertrochanteric fracture of right femur, subsequent encounter for closed fracture with routine healing: Secondary | ICD-10-CM | POA: Diagnosis not present

## 2020-08-12 DIAGNOSIS — N186 End stage renal disease: Secondary | ICD-10-CM | POA: Diagnosis not present

## 2020-08-13 ENCOUNTER — Inpatient Hospital Stay (HOSPITAL_COMMUNITY): Payer: Medicare PPO | Attending: Hematology

## 2020-08-13 ENCOUNTER — Inpatient Hospital Stay (HOSPITAL_COMMUNITY): Payer: Medicare PPO

## 2020-08-13 ENCOUNTER — Encounter (HOSPITAL_COMMUNITY): Payer: Self-pay

## 2020-08-13 VITALS — BP 154/72 | HR 70 | Temp 97.2°F | Resp 18

## 2020-08-13 DIAGNOSIS — C50411 Malignant neoplasm of upper-outer quadrant of right female breast: Secondary | ICD-10-CM | POA: Insufficient documentation

## 2020-08-13 DIAGNOSIS — Z5111 Encounter for antineoplastic chemotherapy: Secondary | ICD-10-CM | POA: Insufficient documentation

## 2020-08-13 DIAGNOSIS — Z9181 History of falling: Secondary | ICD-10-CM | POA: Diagnosis not present

## 2020-08-13 DIAGNOSIS — Z79811 Long term (current) use of aromatase inhibitors: Secondary | ICD-10-CM | POA: Diagnosis not present

## 2020-08-13 DIAGNOSIS — M8580 Other specified disorders of bone density and structure, unspecified site: Secondary | ICD-10-CM | POA: Diagnosis not present

## 2020-08-13 DIAGNOSIS — Z992 Dependence on renal dialysis: Secondary | ICD-10-CM | POA: Insufficient documentation

## 2020-08-13 DIAGNOSIS — C9000 Multiple myeloma not having achieved remission: Secondary | ICD-10-CM | POA: Insufficient documentation

## 2020-08-13 DIAGNOSIS — R2681 Unsteadiness on feet: Secondary | ICD-10-CM | POA: Diagnosis not present

## 2020-08-13 DIAGNOSIS — S72141D Displaced intertrochanteric fracture of right femur, subsequent encounter for closed fracture with routine healing: Secondary | ICD-10-CM | POA: Diagnosis not present

## 2020-08-13 DIAGNOSIS — N186 End stage renal disease: Secondary | ICD-10-CM | POA: Diagnosis not present

## 2020-08-13 DIAGNOSIS — M6281 Muscle weakness (generalized): Secondary | ICD-10-CM | POA: Diagnosis not present

## 2020-08-13 DIAGNOSIS — E876 Hypokalemia: Secondary | ICD-10-CM

## 2020-08-13 DIAGNOSIS — R262 Difficulty in walking, not elsewhere classified: Secondary | ICD-10-CM | POA: Diagnosis not present

## 2020-08-13 LAB — CBC WITH DIFFERENTIAL/PLATELET
Abs Immature Granulocytes: 0.03 10*3/uL (ref 0.00–0.07)
Basophils Absolute: 0.1 10*3/uL (ref 0.0–0.1)
Basophils Relative: 1 %
Eosinophils Absolute: 0.2 10*3/uL (ref 0.0–0.5)
Eosinophils Relative: 3 %
HCT: 31 % — ABNORMAL LOW (ref 36.0–46.0)
Hemoglobin: 9.6 g/dL — ABNORMAL LOW (ref 12.0–15.0)
Immature Granulocytes: 1 %
Lymphocytes Relative: 23 %
Lymphs Abs: 1.5 10*3/uL (ref 0.7–4.0)
MCH: 31.8 pg (ref 26.0–34.0)
MCHC: 31 g/dL (ref 30.0–36.0)
MCV: 102.6 fL — ABNORMAL HIGH (ref 80.0–100.0)
Monocytes Absolute: 0.7 10*3/uL (ref 0.1–1.0)
Monocytes Relative: 11 %
Neutro Abs: 3.9 10*3/uL (ref 1.7–7.7)
Neutrophils Relative %: 61 %
Platelets: 356 10*3/uL (ref 150–400)
RBC: 3.02 MIL/uL — ABNORMAL LOW (ref 3.87–5.11)
RDW: 19.8 % — ABNORMAL HIGH (ref 11.5–15.5)
WBC: 6.4 10*3/uL (ref 4.0–10.5)
nRBC: 0 % (ref 0.0–0.2)

## 2020-08-13 LAB — COMPREHENSIVE METABOLIC PANEL
ALT: 13 U/L (ref 0–44)
AST: 18 U/L (ref 15–41)
Albumin: 3.4 g/dL — ABNORMAL LOW (ref 3.5–5.0)
Alkaline Phosphatase: 126 U/L (ref 38–126)
Anion gap: 13 (ref 5–15)
BUN: 38 mg/dL — ABNORMAL HIGH (ref 8–23)
CO2: 26 mmol/L (ref 22–32)
Calcium: 8.7 mg/dL — ABNORMAL LOW (ref 8.9–10.3)
Chloride: 98 mmol/L (ref 98–111)
Creatinine, Ser: 6.56 mg/dL — ABNORMAL HIGH (ref 0.44–1.00)
GFR, Estimated: 6 mL/min — ABNORMAL LOW (ref >60)
Glucose, Bld: 104 mg/dL — ABNORMAL HIGH (ref 70–99)
Potassium: 2.8 mmol/L — ABNORMAL LOW (ref 3.5–5.1)
Sodium: 137 mmol/L (ref 135–145)
Total Bilirubin: 0.5 mg/dL (ref 0.3–1.2)
Total Protein: 6.4 g/dL — ABNORMAL LOW (ref 6.5–8.1)

## 2020-08-13 MED ORDER — DEXAMETHASONE 4 MG PO TABS
10.0000 mg | ORAL_TABLET | Freq: Once | ORAL | Status: AC
  Administered 2020-08-13: 10 mg via ORAL

## 2020-08-13 MED ORDER — DEXAMETHASONE 4 MG PO TABS
ORAL_TABLET | ORAL | Status: AC
  Filled 2020-08-13: qty 3

## 2020-08-13 MED ORDER — PROCHLORPERAZINE MALEATE 10 MG PO TABS
ORAL_TABLET | ORAL | Status: AC
  Filled 2020-08-13: qty 1

## 2020-08-13 MED ORDER — POTASSIUM CHLORIDE CRYS ER 20 MEQ PO TBCR
20.0000 meq | EXTENDED_RELEASE_TABLET | Freq: Once | ORAL | Status: AC
Start: 2020-08-13 — End: 2020-08-13
  Administered 2020-08-13: 20 meq via ORAL
  Filled 2020-08-13: qty 1

## 2020-08-13 MED ORDER — BORTEZOMIB CHEMO SQ INJECTION 3.5 MG (2.5MG/ML)
1.3000 mg/m2 | Freq: Once | INTRAMUSCULAR | Status: AC
  Administered 2020-08-13: 2.25 mg via SUBCUTANEOUS
  Filled 2020-08-13: qty 0.9

## 2020-08-13 MED ORDER — PROCHLORPERAZINE MALEATE 10 MG PO TABS
10.0000 mg | ORAL_TABLET | Freq: Once | ORAL | Status: AC
  Administered 2020-08-13: 10 mg via ORAL

## 2020-08-13 NOTE — Progress Notes (Signed)
Patient presents today for Velcade injection. Labs reviewed by Dr. Lorenso Courier. Message received to give Velcade today and 20 mEq Potassium PO. Potassium 2.8 , Creatinine 6.56 (Dialysis Patient). Patient denies any side effects from her treatment. Patient complains of occasional pain in her right leg she rates a twinge per patient's words.   Treatment given today per MD orders. Tolerated without adverse affects. Vital signs stable. No complaints at this time. Discharged from clinic via wheel chair in stable condition. Alert and oriented x 3. F/U with [facility] as scheduled.

## 2020-08-13 NOTE — Patient Instructions (Signed)
[facility]  Discharge Instructions: Thank you for choosing [facility] to provide your oncology and hematology care.  If you have a lab appointment with the [facility], please come in thru the Main Entrance and check in at the main information desk.   We strive to give you quality time with your provider. You may need to reschedule your appointment if you arrive late (15 or more minutes).  Arriving late affects you and other patients whose appointments are after yours.  Also, if you miss three or more appointments without notifying the office, you may be dismissed from the clinic at the provider's discretion.      For prescription refill requests, have your pharmacy contact our office and allow 72 hours for refills to be completed.    Today you received the following chemotherapy and/or immunotherapy agents Velcade injection.    To help prevent nausea and vomiting after your treatment, we encourage you to take your nausea medication as directed.  BELOW ARE SYMPTOMS THAT SHOULD BE REPORTED IMMEDIATELY: . *FEVER GREATER THAN 100.4 F (38 C) OR HIGHER . *CHILLS OR SWEATING . *NAUSEA AND VOMITING THAT IS NOT CONTROLLED WITH YOUR NAUSEA MEDICATION . *UNUSUAL SHORTNESS OF BREATH . *UNUSUAL BRUISING OR BLEEDING . *URINARY PROBLEMS (pain or burning when urinating, or frequent urination) . *BOWEL PROBLEMS (unusual diarrhea, constipation, pain near the anus) . TENDERNESS IN MOUTH AND THROAT WITH OR WITHOUT PRESENCE OF ULCERS (sore throat, sores in mouth, or a toothache) . UNUSUAL RASH, SWELLING OR PAIN  . UNUSUAL VAGINAL DISCHARGE OR ITCHING   Items with * indicate a potential emergency and should be followed up as soon as possible or go to the Emergency Department if any problems should occur.  Please show the CHEMOTHERAPY ALERT CARD or IMMUNOTHERAPY ALERT CARD at check-in to the Emergency Department and triage nurse.  Should you have questions after your visit or  need to cancel or reschedule your appointment, please contact [facility] [phone]  and follow the prompts.  Office hours are 8:00 a.m. to 4:30 p.m. Monday - Friday. Please note that voicemails left after 4:00 p.m. may not be returned until the following business day.  We are closed weekends and major holidays. You have access to a nurse at all times for urgent questions. Please call the main number to the clinic [phone] and follow the prompts.  For any non-urgent questions, you may also contact your provider using MyChart. We now offer e-Visits for anyone 61 and older to request care online for non-urgent symptoms. For details visit [email or web address].   Also download the MyChart app! Go to the app store, search "MyChart", open the app, select [facility], and log in with your MyChart username and password.  Due to Covid, a mask is required upon entering the hospital/clinic. If you do not have a mask, one will be given to you upon arrival. For doctor visits, patients may have 1 support person aged 55 or older with them. For treatment visits, patients cannot have anyone with them due to current Covid guidelines and our immunocompromised population.

## 2020-08-14 DIAGNOSIS — Z992 Dependence on renal dialysis: Secondary | ICD-10-CM | POA: Diagnosis not present

## 2020-08-14 DIAGNOSIS — Z9181 History of falling: Secondary | ICD-10-CM | POA: Diagnosis not present

## 2020-08-14 DIAGNOSIS — R262 Difficulty in walking, not elsewhere classified: Secondary | ICD-10-CM | POA: Diagnosis not present

## 2020-08-14 DIAGNOSIS — M6281 Muscle weakness (generalized): Secondary | ICD-10-CM | POA: Diagnosis not present

## 2020-08-14 DIAGNOSIS — N186 End stage renal disease: Secondary | ICD-10-CM | POA: Diagnosis not present

## 2020-08-14 DIAGNOSIS — S72141D Displaced intertrochanteric fracture of right femur, subsequent encounter for closed fracture with routine healing: Secondary | ICD-10-CM | POA: Diagnosis not present

## 2020-08-14 DIAGNOSIS — R2681 Unsteadiness on feet: Secondary | ICD-10-CM | POA: Diagnosis not present

## 2020-08-15 ENCOUNTER — Other Ambulatory Visit: Payer: Self-pay | Admitting: *Deleted

## 2020-08-15 ENCOUNTER — Telehealth: Payer: Self-pay

## 2020-08-15 ENCOUNTER — Encounter: Payer: Self-pay | Admitting: *Deleted

## 2020-08-15 ENCOUNTER — Encounter: Payer: Self-pay | Admitting: Vascular Surgery

## 2020-08-15 ENCOUNTER — Ambulatory Visit (INDEPENDENT_AMBULATORY_CARE_PROVIDER_SITE_OTHER): Payer: Medicare PPO | Admitting: Vascular Surgery

## 2020-08-15 VITALS — BP 144/50 | HR 56 | Temp 97.7°F | Resp 16 | Ht 63.0 in | Wt 143.0 lb

## 2020-08-15 DIAGNOSIS — Z9181 History of falling: Secondary | ICD-10-CM | POA: Diagnosis not present

## 2020-08-15 DIAGNOSIS — Z992 Dependence on renal dialysis: Secondary | ICD-10-CM

## 2020-08-15 DIAGNOSIS — R2681 Unsteadiness on feet: Secondary | ICD-10-CM | POA: Diagnosis not present

## 2020-08-15 DIAGNOSIS — N186 End stage renal disease: Secondary | ICD-10-CM | POA: Diagnosis not present

## 2020-08-15 DIAGNOSIS — M6281 Muscle weakness (generalized): Secondary | ICD-10-CM | POA: Diagnosis not present

## 2020-08-15 DIAGNOSIS — S72141D Displaced intertrochanteric fracture of right femur, subsequent encounter for closed fracture with routine healing: Secondary | ICD-10-CM | POA: Diagnosis not present

## 2020-08-15 DIAGNOSIS — R262 Difficulty in walking, not elsewhere classified: Secondary | ICD-10-CM | POA: Diagnosis not present

## 2020-08-15 NOTE — Telephone Encounter (Signed)
Received call from [place] at [facility] dialysis in [place] advising pts POA/sister, Burman Freestone  does not want to move forward with a dialysis leg graft at this time if needed due to pt recently fell and broke her hip and didn't feel it would be appropriate with her mobility impairment. No problems proceeding with fistula ligation or venogram as scheduled. Advised will pass this information along to Dr. Oneida Alar.

## 2020-08-15 NOTE — H&P (View-Only) (Signed)
Patient name: Carly Wood MRN: [id number] DOB: October 09, 1935 Sex: female  REASON FOR CONSULT: Hemodialysis access  HPI: Carly Wood is a 85 y.o. female, with known venous hypertension in her right upper extremity and central vein occlusion on the right side.  We had previously evaluated her and were considering ligation of the AV fistula and possible placement of a thigh graft.  The patient currently is dialyzing via left sided catheter.  She still has a patent fistula on the right side with a very swollen right arm.  She does have some mild dementia.  She currently is living at the [facility].  Her dialysis day is Monday Wednesday Friday.  She has had a previous fistula in the left arm but I cannot find any record of any other access procedures on the left side.  She has not had a left central venogram recently.  She did have a right central venogram recently which showed the central vein findings as mentioned above.  She has had a prior left Port-A-Cath removed several years ago  Past Medical History:  Diagnosis Date   Anemia     chronic macrocytic anemia   Anxiety    Chronic kidney disease    Chronic renal disease, stage 4, severely decreased glomerular filtration rate (GFR) between 15-29 mL/min/1.73 square meter ([place]) 5/00/9381   Complication of anesthesia    delirious after Breast Surgery   Dementia ([place])    mild   Depression    Diabetes mellitus with ESRD (end-stage renal disease) ([place])    type II   Dyspnea    with activity   GERD (gastroesophageal reflux disease)    Glaucoma    Hypertension    Pneumonia    Stage 1 infiltrating ductal carcinoma of right female breast ([place]) 08/21/2015   ER+ PR+ HER 2 neu + (3+) T1cN0    Past Surgical History:  Procedure Laterality Date   A/V FISTULAGRAM Right 07/05/2020   Procedure: A/V Fistulagram;  Surgeon: Elam Dutch, MD;  Location: [facility];  Service: Cardiovascular;  Laterality: Right;   AV FISTULA PLACEMENT Left  11/22/2017   Procedure: ARTERIOVENOUS (AV) FISTULA CREATION LEFT ARM;  Surgeon: Elam Dutch, MD;  Location: [facility];  Service: Vascular;  Laterality: Left;   AV FISTULA PLACEMENT Right 04/04/2020   Procedure: RIGHT ARM ARTERIOVENOUS FISTULA CREATION;  Surgeon: Rosetta Posner, MD;  Location: [facility];  Service: Vascular;  Laterality: Right;   BIOPSY  08/07/2016   Procedure: BIOPSY;  Surgeon: Daneil Dolin, MD;  [facility];  Service: Endoscopy;;  gastric ulcer biopsy   COLONOSCOPY     ESOPHAGOGASTRODUODENOSCOPY N/A 08/07/2016   LA Grade A esophagitis s/p dilation, small hiatal hernia, multiple gastric ulcers and erosions, duodenal erosions s/p biopsy. Negative H.pylori    ESOPHAGOGASTRODUODENOSCOPY N/A 11/27/2016   normal esophagus, previously noted gastric ulcers completely healed, normal duodenum.    ESOPHAGOGASTRODUODENOSCOPY (EGD) WITH PROPOFOL N/A 07/23/2020   Procedure: ESOPHAGOGASTRODUODENOSCOPY (EGD) WITH PROPOFOL;  Surgeon: Rogene Houston, MD;  [facility];  Service: Endoscopy;  Laterality: N/A;   FISTULA SUPERFICIALIZATION Left 02/14/2018   Procedure: FISTULA SUPERFICIALIZATION LEFT ARM;  Surgeon: Angelia Mould, MD;  Location: [facility] OR;  Service: Vascular;  Laterality: Left;   FRACTURE SURGERY Right    ankle   HOT HEMOSTASIS  07/23/2020   Procedure: HOT HEMOSTASIS (ARGON PLASMA COAGULATION/BICAP);  Surgeon: Rogene Houston, MD;  [facility];  Service: Endoscopy;;  INTRAMEDULLARY (IM) NAIL INTERTROCHANTERIC Right 07/12/2020   Procedure: INTRAMEDULLARY (IM) NAIL INTERTROCHANTRIC;  Surgeon: Mordecai Rasmussen, MD;  Location: [facility];  Service: Orthopedics;  Laterality: Right;   MALONEY DILATION N/A 08/07/2016   Procedure: Venia Minks DILATION;  Surgeon: Daneil Dolin, MD;  [facility];  Service: Endoscopy;  Laterality: N/A;   MASTECTOMY, PARTIAL Right    multiple myeloma     PERIPHERAL VASCULAR BALLOON ANGIOPLASTY Left 07/13/2019   Procedure:  PERIPHERAL VASCULAR BALLOON ANGIOPLASTY;  Surgeon: Marty Heck, MD;  Location: [facility];  Service: Cardiovascular;  Laterality: Left;  arm fistulogram   PERIPHERAL VASCULAR BALLOON ANGIOPLASTY Right 05/22/2020   Procedure: PERIPHERAL VASCULAR BALLOON ANGIOPLASTY;  Surgeon: Cherre Robins, MD;  Location: [facility];  Service: Cardiovascular;  Laterality: Right;  arm fistula   PERIPHERAL VASCULAR BALLOON ANGIOPLASTY Right 07/05/2020   Procedure: PERIPHERAL VASCULAR BALLOON ANGIOPLASTY;  Surgeon: Elam Dutch, MD;  Location: [facility];  Service: Cardiovascular;  Laterality: Right;  arm fistula   PORT-A-CATH REMOVAL Left 11/22/2017   Procedure: REMOVAL PORT-A-CATH LEFT CHEST;  Surgeon: Elam Dutch, MD;  Location: [facility] OR;  Service: Vascular;  Laterality: Left;   RETINAL DETACHMENT SURGERY Right    SCLEROTHERAPY  07/23/2020   Procedure: SCLEROTHERAPY;  Surgeon: Rogene Houston, MD;  [facility];  Service: Endoscopy;;    Family History  Problem Relation Age of Onset   Multiple myeloma Sister    Brain cancer Sister    Dementia Mother        died at 36   Stroke Mother    Heart failure Mother    Diabetes Mother    Heart disease Father    Prostate cancer Brother    Colon cancer Neg Hx     SOCIAL HISTORY: Social History   Socioeconomic History   Marital status: Single    Spouse name: Not on file   Number of children: Not on file   Years of education: Not on file   Highest education level: Not on file  Occupational History   Occupation: retired   Tobacco Use   Smoking status: Never   Smokeless tobacco: Never  Vaping Use   Vaping Use: Never used  Substance and Sexual Activity   Alcohol use: No    Alcohol/week: 0.0 standard drinks   Drug use: No   Sexual activity: Never  Other Topics Concern   Not on file  Social History Narrative   Long term resident of SNF    Social Determinants of Health   Financial Resource Strain:  Low Risk    Difficulty of Paying Living Expenses: Not very hard  Food Insecurity: No Food Insecurity   Worried About Charity fundraiser in the Last Year: Never true   [place] in the Last Year: Never true  Transportation Needs: No Transportation Needs   Lack of Transportation (Medical): No   Lack of Transportation (Non-Medical): No  Physical Activity: Inactive   Days of Exercise per Week: 0 days   Minutes of Exercise per Session: 0 min  Stress: No Stress Concern Present   Feeling of Stress : Not at all  Social Connections: Moderately Isolated   Frequency of Communication with Friends and Family: More than three times a week   Frequency of Social Gatherings with Friends and Family: Once a week   Attends Religious Services: More than 4 times per year   Active Member of Clubs or  Organizations: No   Attends Music therapist: Never   Marital Status: Never married  Human resources officer Violence: Not At Risk   Fear of Current or Ex-Partner: No   Emotionally Abused: No   Physically Abused: No   Sexually Abused: No    Allergies  Allergen Reactions   Ace Inhibitors Other (See Comments) and Cough    Tongue swell , ie angioedema   Angiotensin Receptor Blockers     Angioedema with ACE-I   Penicillins Other (See Comments)    Unsure of reaction Has patient had a PCN reaction causing immediate rash, facial/tongue/throat swelling, SOB or lightheadedness with hypotension: Unknown Has patient had a PCN reaction causing severe rash involving mucus membranes or skin necrosis: Unknown Has patient had a PCN reaction that required hospitalization: No Has patient had a PCN reaction occurring within the last 10 years: Unknown If all of the above answers are "NO", then may proceed with Cephalosporin use.      No current outpatient medications on file.   No current facility-administered medications for this visit.    ROS:   General:  No weight loss, Fever, chills  HEENT: No  recent headaches, no nasal bleeding, no visual changes, no sore throat  Neurologic: No dizziness, blackouts, seizures. No recent symptoms of stroke or mini- stroke. No recent episodes of slurred speech, or temporary blindness.  Cardiac: No recent episodes of chest pain/pressure, no shortness of breath at rest.  No shortness of breath with exertion.  Denies history of atrial fibrillation or irregular heartbeat  Vascular: No history of rest pain in feet.  No history of claudication.  No history of non-healing ulcer, No history of DVT   Pulmonary: No home oxygen, no productive cough, no hemoptysis,  No asthma or wheezing  Musculoskeletal:  [ ]  Arthritis, [ ]  Low back pain,  [ ]  Joint pain  Hematologic:No history of hypercoagulable state.  No history of easy bleeding.  No history of anemia  Gastrointestinal: No hematochezia or melena,  No gastroesophageal reflux, no trouble swallowing  Urinary: [X]  chronic Kidney disease, [X]  on HD - [X]  MWF or [ ]  TTHS, [ ]  Burning with urination, [ ]  Frequent urination, [ ]  Difficulty urinating;   Skin: No rashes  Psychological: No history of anxiety,  No history of depression   Physical Examination  Vitals:   08/15/20 0821  BP: (!) 144/50  Pulse: (!) 56  Resp: 16  Temp: 97.7 F (36.5 C)  TempSrc: Temporal  SpO2: 95%  Weight: 143 lb (64.9 kg)  Height: 5\' 3"  (1.6 m)    Body mass index is 25.33 kg/m.  General:  Alert and oriented, no acute distress HEENT: Normal Neck: No JVD, left side dialysis catheter Pulmonary: Clear to auscultation bilaterally Cardiac: Regular Rate and Rhythm Extremity Pulses: Diffusely edematous right upper extremity difficult to palpate pulses patent pulsatile fistula right antecubital region, left upper extremity mid upper arm biceps scar 2+ left radial pulse Musculoskeletal: Right arm edema as mentioned above  Neurologic: Upper and lower extremity motor 5/5 and symmetric   ASSESSMENT: Patient has venous  hypertension with arm swelling on the right side.  2.  Patient currently dialyzing via a left side dialysis catheter need to consider whether or not she would be a candidate for a left upper arm access versus thigh graft access.  Due to her dementia thigh graft access may be more difficult to consider.  She did seem to be alert and oriented today but does have some  mild confusion.   PLAN: 1.  Ligation right arm AV fistula scheduled for Tuesday, August 20, 2020.  2.  Left central venogram scheduled with my partner Dr. Carlis Hrdlicka for August 22, 2020.  3.  The patient will follow up with me in 3 weeks time for bilateral ABIs for consideration of left arm versus thigh graft access.   Ruta Hinds, MD [facility] Office: [phone]

## 2020-08-15 NOTE — Progress Notes (Signed)
Patient name: Carly Wood MRN: [id number] DOB: October 09, 1935 Sex: female  REASON FOR CONSULT: Hemodialysis access  HPI: Carly Wood is a 85 y.o. female, with known venous hypertension in her right upper extremity and central vein occlusion on the right side.  We had previously evaluated her and were considering ligation of the AV fistula and possible placement of a thigh graft.  The patient currently is dialyzing via left sided catheter.  She still has a patent fistula on the right side with a very swollen right arm.  She does have some mild dementia.  She currently is living at the [facility].  Her dialysis day is Monday Wednesday Friday.  She has had a previous fistula in the left arm but I cannot find any record of any other access procedures on the left side.  She has not had a left central venogram recently.  She did have a right central venogram recently which showed the central vein findings as mentioned above.  She has had a prior left Port-A-Cath removed several years ago  Past Medical History:  Diagnosis Date   Anemia     chronic macrocytic anemia   Anxiety    Chronic kidney disease    Chronic renal disease, stage 4, severely decreased glomerular filtration rate (GFR) between 15-29 mL/min/1.73 square meter ([place]) 5/00/9381   Complication of anesthesia    delirious after Breast Surgery   Dementia ([place])    mild   Depression    Diabetes mellitus with ESRD (end-stage renal disease) ([place])    type II   Dyspnea    with activity   GERD (gastroesophageal reflux disease)    Glaucoma    Hypertension    Pneumonia    Stage 1 infiltrating ductal carcinoma of right female breast ([place]) 08/21/2015   ER+ PR+ HER 2 neu + (3+) T1cN0    Past Surgical History:  Procedure Laterality Date   A/V FISTULAGRAM Right 07/05/2020   Procedure: A/V Fistulagram;  Surgeon: Elam Dutch, MD;  Location: [place] [facility];  Service: Cardiovascular;  Laterality: Right;   AV FISTULA PLACEMENT Left  11/22/2017   Procedure: ARTERIOVENOUS (AV) FISTULA CREATION LEFT ARM;  Surgeon: Elam Dutch, MD;  Location: [facility];  Service: Vascular;  Laterality: Left;   AV FISTULA PLACEMENT Right 04/04/2020   Procedure: RIGHT ARM ARTERIOVENOUS FISTULA CREATION;  Surgeon: Rosetta Posner, MD;  Location: [facility];  Service: Vascular;  Laterality: Right;   BIOPSY  08/07/2016   Procedure: BIOPSY;  Surgeon: Daneil Dolin, MD;  [facility];  Service: Endoscopy;;  gastric ulcer biopsy   COLONOSCOPY     ESOPHAGOGASTRODUODENOSCOPY N/A 08/07/2016   LA Grade A esophagitis s/p dilation, small hiatal hernia, multiple gastric ulcers and erosions, duodenal erosions s/p biopsy. Negative H.pylori    ESOPHAGOGASTRODUODENOSCOPY N/A 11/27/2016   normal esophagus, previously noted gastric ulcers completely healed, normal duodenum.    ESOPHAGOGASTRODUODENOSCOPY (EGD) WITH PROPOFOL N/A 07/23/2020   Procedure: ESOPHAGOGASTRODUODENOSCOPY (EGD) WITH PROPOFOL;  Surgeon: Rogene Houston, MD;  [facility];  Service: Endoscopy;  Laterality: N/A;   FISTULA SUPERFICIALIZATION Left 02/14/2018   Procedure: FISTULA SUPERFICIALIZATION LEFT ARM;  Surgeon: Angelia Mould, MD;  Location: [facility] OR;  Service: Vascular;  Laterality: Left;   FRACTURE SURGERY Right    ankle   HOT HEMOSTASIS  07/23/2020   Procedure: HOT HEMOSTASIS (ARGON PLASMA COAGULATION/BICAP);  Surgeon: Rogene Houston, MD;  [facility];  Service: Endoscopy;;  INTRAMEDULLARY (IM) NAIL INTERTROCHANTERIC Right 07/12/2020   Procedure: INTRAMEDULLARY (IM) NAIL INTERTROCHANTRIC;  Surgeon: Mordecai Rasmussen, MD;  Location: [facility];  Service: Orthopedics;  Laterality: Right;   MALONEY DILATION N/A 08/07/2016   Procedure: Venia Minks DILATION;  Surgeon: Daneil Dolin, MD;  [facility];  Service: Endoscopy;  Laterality: N/A;   MASTECTOMY, PARTIAL Right    multiple myeloma     PERIPHERAL VASCULAR BALLOON ANGIOPLASTY Left 07/13/2019   Procedure:  PERIPHERAL VASCULAR BALLOON ANGIOPLASTY;  Surgeon: Marty Heck, MD;  Location: [place] [facility];  Service: Cardiovascular;  Laterality: Left;  arm fistulogram   PERIPHERAL VASCULAR BALLOON ANGIOPLASTY Right 05/22/2020   Procedure: PERIPHERAL VASCULAR BALLOON ANGIOPLASTY;  Surgeon: Cherre Robins, MD;  Location: [place] [facility];  Service: Cardiovascular;  Laterality: Right;  arm fistula   PERIPHERAL VASCULAR BALLOON ANGIOPLASTY Right 07/05/2020   Procedure: PERIPHERAL VASCULAR BALLOON ANGIOPLASTY;  Surgeon: Elam Dutch, MD;  Location: [place] [facility];  Service: Cardiovascular;  Laterality: Right;  arm fistula   PORT-A-CATH REMOVAL Left 11/22/2017   Procedure: REMOVAL PORT-A-CATH LEFT CHEST;  Surgeon: Elam Dutch, MD;  Location: [name] OR;  Service: Vascular;  Laterality: Left;   RETINAL DETACHMENT SURGERY Right    SCLEROTHERAPY  07/23/2020   Procedure: SCLEROTHERAPY;  Surgeon: Rogene Houston, MD;  [facility];  Service: Endoscopy;;    Family History  Problem Relation Age of Onset   Multiple myeloma Sister    Brain cancer Sister    Dementia Mother        died at 72   Stroke Mother    Heart failure Mother    Diabetes Mother    Heart disease Father    Prostate cancer Brother    Colon cancer Neg Hx     SOCIAL HISTORY: Social History   Socioeconomic History   Marital status: Single    Spouse name: Not on file   Number of children: Not on file   Years of education: Not on file   Highest education level: Not on file  Occupational History   Occupation: retired   Tobacco Use   Smoking status: Never   Smokeless tobacco: Never  Vaping Use   Vaping Use: Never used  Substance and Sexual Activity   Alcohol use: No    Alcohol/week: 0.0 standard drinks   Drug use: No   Sexual activity: Never  Other Topics Concern   Not on file  Social History Narrative   Long term resident of SNF    Social Determinants of Health   Financial Resource Strain:  Low Risk    Difficulty of Paying Living Expenses: Not very hard  Food Insecurity: No Food Insecurity   Worried About Charity fundraiser in the Last Year: Never true   [place] in the Last Year: Never true  Transportation Needs: No Transportation Needs   Lack of Transportation (Medical): No   Lack of Transportation (Non-Medical): No  Physical Activity: Inactive   Days of Exercise per Week: 0 days   Minutes of Exercise per Session: 0 min  Stress: No Stress Concern Present   Feeling of Stress : Not at all  Social Connections: Moderately Isolated   Frequency of Communication with Friends and Family: More than three times a week   Frequency of Social Gatherings with Friends and Family: Once a week   Attends Religious Services: More than 4 times per year   Active Member of Clubs or  Organizations: No   Attends Music therapist: Never   Marital Status: Never married  Human resources officer Violence: Not At Risk   Fear of Current or Ex-Partner: No   Emotionally Abused: No   Physically Abused: No   Sexually Abused: No    Allergies  Allergen Reactions   Ace Inhibitors Other (See Comments) and Cough    Tongue swell , ie angioedema   Angiotensin Receptor Blockers     Angioedema with ACE-I   Penicillins Other (See Comments)    Unsure of reaction Has patient had a PCN reaction causing immediate rash, facial/tongue/throat swelling, SOB or lightheadedness with hypotension: Unknown Has patient had a PCN reaction causing severe rash involving mucus membranes or skin necrosis: Unknown Has patient had a PCN reaction that required hospitalization: No Has patient had a PCN reaction occurring within the last 10 years: Unknown If all of the above answers are "NO", then may proceed with Cephalosporin use.      No current outpatient medications on file.   No current facility-administered medications for this visit.    ROS:   General:  No weight loss, Fever, chills  HEENT: No  recent headaches, no nasal bleeding, no visual changes, no sore throat  Neurologic: No dizziness, blackouts, seizures. No recent symptoms of stroke or mini- stroke. No recent episodes of slurred speech, or temporary blindness.  Cardiac: No recent episodes of chest pain/pressure, no shortness of breath at rest.  No shortness of breath with exertion.  Denies history of atrial fibrillation or irregular heartbeat  Vascular: No history of rest pain in feet.  No history of claudication.  No history of non-healing ulcer, No history of DVT   Pulmonary: No home oxygen, no productive cough, no hemoptysis,  No asthma or wheezing  Musculoskeletal:  [ ]  Arthritis, [ ]  Low back pain,  [ ]  Joint pain  Hematologic:No history of hypercoagulable state.  No history of easy bleeding.  No history of anemia  Gastrointestinal: No hematochezia or melena,  No gastroesophageal reflux, no trouble swallowing  Urinary: [X]  chronic Kidney disease, [X]  on HD - [X]  MWF or [ ]  TTHS, [ ]  Burning with urination, [ ]  Frequent urination, [ ]  Difficulty urinating;   Skin: No rashes  Psychological: No history of anxiety,  No history of depression   Physical Examination  Vitals:   08/15/20 0821  BP: (!) 144/50  Pulse: (!) 56  Resp: 16  Temp: 97.7 F (36.5 C)  TempSrc: Temporal  SpO2: 95%  Weight: 143 lb (64.9 kg)  Height: 5\' 3"  (1.6 m)    Body mass index is 25.33 kg/m.  General:  Alert and oriented, no acute distress HEENT: Normal Neck: No JVD, left side dialysis catheter Pulmonary: Clear to auscultation bilaterally Cardiac: Regular Rate and Rhythm Extremity Pulses: Diffusely edematous right upper extremity difficult to palpate pulses patent pulsatile fistula right antecubital region, left upper extremity mid upper arm biceps scar 2+ left radial pulse Musculoskeletal: Right arm edema as mentioned above  Neurologic: Upper and lower extremity motor 5/5 and symmetric   ASSESSMENT: Patient has venous  hypertension with arm swelling on the right side.  2.  Patient currently dialyzing via a left side dialysis catheter need to consider whether or not she would be a candidate for a left upper arm access versus thigh graft access.  Due to her dementia thigh graft access may be more difficult to consider.  She did seem to be alert and oriented today but does have some  mild confusion.   PLAN: 1.  Ligation right arm AV fistula scheduled for Tuesday, August 20, 2020.  2.  Left central venogram scheduled with my partner Dr. Carlis Lemaster for August 22, 2020.  3.  The patient will follow up with me in 3 weeks time for bilateral ABIs for consideration of left arm versus thigh graft access.   Ruta Hinds, MD [facility] Office: [phone]

## 2020-08-16 ENCOUNTER — Other Ambulatory Visit: Payer: Self-pay | Admitting: *Deleted

## 2020-08-16 DIAGNOSIS — Z992 Dependence on renal dialysis: Secondary | ICD-10-CM | POA: Diagnosis not present

## 2020-08-16 DIAGNOSIS — M6281 Muscle weakness (generalized): Secondary | ICD-10-CM | POA: Diagnosis not present

## 2020-08-16 DIAGNOSIS — N186 End stage renal disease: Secondary | ICD-10-CM | POA: Diagnosis not present

## 2020-08-16 DIAGNOSIS — R2681 Unsteadiness on feet: Secondary | ICD-10-CM | POA: Diagnosis not present

## 2020-08-16 DIAGNOSIS — Z9181 History of falling: Secondary | ICD-10-CM | POA: Diagnosis not present

## 2020-08-16 DIAGNOSIS — S72141D Displaced intertrochanteric fracture of right femur, subsequent encounter for closed fracture with routine healing: Secondary | ICD-10-CM | POA: Diagnosis not present

## 2020-08-16 DIAGNOSIS — I70209 Unspecified atherosclerosis of native arteries of extremities, unspecified extremity: Secondary | ICD-10-CM

## 2020-08-16 DIAGNOSIS — R262 Difficulty in walking, not elsewhere classified: Secondary | ICD-10-CM | POA: Diagnosis not present

## 2020-08-19 ENCOUNTER — Encounter (HOSPITAL_COMMUNITY): Payer: Self-pay | Admitting: Vascular Surgery

## 2020-08-19 DIAGNOSIS — Z9181 History of falling: Secondary | ICD-10-CM | POA: Diagnosis not present

## 2020-08-19 DIAGNOSIS — Z992 Dependence on renal dialysis: Secondary | ICD-10-CM | POA: Diagnosis not present

## 2020-08-19 DIAGNOSIS — N186 End stage renal disease: Secondary | ICD-10-CM | POA: Diagnosis not present

## 2020-08-19 DIAGNOSIS — M6281 Muscle weakness (generalized): Secondary | ICD-10-CM | POA: Diagnosis not present

## 2020-08-19 DIAGNOSIS — R262 Difficulty in walking, not elsewhere classified: Secondary | ICD-10-CM | POA: Diagnosis not present

## 2020-08-19 DIAGNOSIS — S72141D Displaced intertrochanteric fracture of right femur, subsequent encounter for closed fracture with routine healing: Secondary | ICD-10-CM | POA: Diagnosis not present

## 2020-08-19 DIAGNOSIS — R2681 Unsteadiness on feet: Secondary | ICD-10-CM | POA: Diagnosis not present

## 2020-08-19 NOTE — Progress Notes (Addendum)
Surgical Instructions for Carly Wood Date of Surgery: 08/20/20 at 9:56 am - 11:24 am   Your procedure is scheduled on 08/20/20  Report to [facility] Main Entrance "A" at 7:30 A.M., then check in with the Admitting office.  Call this number if you have problems the morning of surgery:  [phone]   If you have any questions prior to your surgery date call [phone]: Open Monday-Friday 8am-4pm    Remember:  Do not eat or drink after midnight the night before your surgery.    Take these medicines the morning of surgery with A SIP OF WATER: Tylenol Acyclovir Arimidex Atenolol Protonix Zofran PRN  _  _   DIABETIC MEDICATION _  _     The night day before your procedure:  Take 5 units of Lantus Insulin.  The day of your procedure:  Do not take your morning dose We will check your blood sugar levels during the admission process and again in Recovery before discharging you home  If your blood sugar is less than 70 mg/dL, you will need to treat for low blood sugar: Treat a low blood sugar (less than 70 mg/dL) with  cup of clear juice (cranberry or apple), 4 glucose tablets, OR glucose gel. Recheck blood sugar in 15 minutes after treatment (to make sure it is greater than 70 mg/dL). If your blood sugar is not greater than 70 mg/dL on recheck, call [phone] for further instructions.  As of today, STOP taking any Aspirin (unless otherwise instructed by your surgeon) Aleve, Naproxen, Ibuprofen, Motrin, Advil, Goody's, BC's, all herbal medications, fish oil, and all vitamins.          Do not wear jewelry or makeup Do not wear lotions, powders, perfumes or deodorant. Do not shave 48 hours prior to surgery.   Do not bring valuables to the hospital. DO Not wear nail polish, gel polish, artificial nails, or any other type of covering on natural nails including finger and toenails. If patients have artificial nails, gel coating, etc. that need to be removed by a nail salon please  have this removed prior to surgery or surgery may need to be canceled/delayed if the surgeon/ anesthesia feels like the patient is unable to be adequately monitored.             [facility] is not responsible for any belongings or valuables.  Do NOT Smoke (Tobacco/Vaping) or drink Alcohol 24 hours prior to your procedure If you use a CPAP at night, you may bring all equipment for your overnight stay.   Contacts, glasses, dentures or bridgework may not be worn into surgery, please bring cases for these belongings   Patients discharged the day of surgery will not be allowed to drive home, and someone needs to stay with them for 24 hours.  ONLY 1 SUPPORT PERSON MAY BE PRESENT WHILE YOU ARE IN SURGERY. IF YOU ARE TO BE ADMITTED ONCE YOU ARE IN YOUR ROOM YOU WILL BE ALLOWED TWO (2) VISITORS.    Special instructions:    Oral Hygiene is also important to reduce your risk of infection.  Remember - BRUSH YOUR TEETH THE MORNING OF SURGERY WITH YOUR REGULAR TOOTHPASTE

## 2020-08-19 NOTE — Progress Notes (Signed)
Patient resides at [facility] in [place].  Spoke with Nurse Ola, LPN.  Per Ols, fax instructions for DOS to Supervisor Marliss Coots [phone].    PCP - Ok Edwards, NP Cardiologist - n/a Oncology - Dr Delton Coombes  Chest x-ray - 07/23/20 (1V) EKG - 07/13/20 Stress Test - n/a ECHO - 05/13/15 Cardiac Cath - n/a  Fasting Blood Sugar - 110s Checks Blood Sugar 3 times a day  THE NIGHT BEFORE SURGERY, take 5 Units of Lantus Insulin.      THE MORNING OF SURGERY, do not take Humalog Insulin.    If your blood sugar is less than 70 mg/dL, you will need to treat for low blood sugar: Treat a low blood sugar (less than 70 mg/dL) with  cup of clear juice (cranberry or apple), 4 glucose tablets, OR glucose gel. Recheck blood sugar in 15 minutes after treatment (to make sure it is greater than 70 mg/dL). If your blood sugar is not greater than 70 mg/dL on recheck, call [phone] for further instructions.  Anesthesia review: yes  STOP now taking any Aspirin (unless otherwise instructed by your surgeon), Aleve, Naproxen, Ibuprofen, Motrin, Advil, Goody's, BC's, all herbal medications, fish oil, and all vitamins.   Coronavirus Screening Covid test n/a- ambulatory surgery  Olas, LPN states patient does not have any of the following symptoms:  Cough yes/no: No Fever (>100.31F)  yes/no: No Runny nose yes/no: No Sore throat yes/no: No Difficulty breathing/shortness of breath  yes/no: No  Have you traveled in the last 14 days and where? yes/no: No  Instructions for DOS were faxed.

## 2020-08-20 ENCOUNTER — Ambulatory Visit (HOSPITAL_COMMUNITY): Payer: Medicare PPO | Admitting: Physician Assistant

## 2020-08-20 ENCOUNTER — Ambulatory Visit (HOSPITAL_COMMUNITY)
Admission: RE | Admit: 2020-08-20 | Discharge: 2020-08-20 | Disposition: A | Discharge status: Skilled Nursing Facility | Payer: Medicare PPO | Attending: Vascular Surgery | Admitting: Vascular Surgery

## 2020-08-20 ENCOUNTER — Inpatient Hospital Stay
Admission: RE | Admit: 2020-08-20 | Discharge: 2020-08-22 | Disposition: A | Discharge status: Nursing Home (Includes ICF) | Payer: Medicare PPO | Source: Ambulatory Visit | Attending: Internal Medicine | Admitting: Internal Medicine

## 2020-08-20 ENCOUNTER — Encounter (HOSPITAL_COMMUNITY): Payer: Self-pay | Admitting: Vascular Surgery

## 2020-08-20 ENCOUNTER — Encounter (HOSPITAL_COMMUNITY)
Admission: RE | Disposition: A | Discharge status: Skilled Nursing Facility | Payer: Self-pay | Source: Home / Self Care | Attending: Vascular Surgery

## 2020-08-20 DIAGNOSIS — N186 End stage renal disease: Secondary | ICD-10-CM

## 2020-08-20 DIAGNOSIS — I120 Hypertensive chronic kidney disease with stage 5 chronic kidney disease or end stage renal disease: Secondary | ICD-10-CM | POA: Diagnosis not present

## 2020-08-20 DIAGNOSIS — E1122 Type 2 diabetes mellitus with diabetic chronic kidney disease: Secondary | ICD-10-CM | POA: Insufficient documentation

## 2020-08-20 DIAGNOSIS — Z880 Allergy status to penicillin: Secondary | ICD-10-CM | POA: Diagnosis not present

## 2020-08-20 DIAGNOSIS — Z992 Dependence on renal dialysis: Secondary | ICD-10-CM | POA: Diagnosis not present

## 2020-08-20 DIAGNOSIS — Z888 Allergy status to other drugs, medicaments and biological substances status: Secondary | ICD-10-CM | POA: Diagnosis not present

## 2020-08-20 DIAGNOSIS — R2681 Unsteadiness on feet: Secondary | ICD-10-CM | POA: Diagnosis not present

## 2020-08-20 DIAGNOSIS — Z794 Long term (current) use of insulin: Secondary | ICD-10-CM | POA: Diagnosis not present

## 2020-08-20 DIAGNOSIS — S72141D Displaced intertrochanteric fracture of right femur, subsequent encounter for closed fracture with routine healing: Secondary | ICD-10-CM | POA: Diagnosis not present

## 2020-08-20 DIAGNOSIS — R262 Difficulty in walking, not elsewhere classified: Secondary | ICD-10-CM | POA: Diagnosis not present

## 2020-08-20 DIAGNOSIS — Y841 Kidney dialysis as the cause of abnormal reaction of the patient, or of later complication, without mention of misadventure at the time of the procedure: Secondary | ICD-10-CM | POA: Insufficient documentation

## 2020-08-20 DIAGNOSIS — T82510A Breakdown (mechanical) of surgically created arteriovenous fistula, initial encounter: Secondary | ICD-10-CM | POA: Insufficient documentation

## 2020-08-20 DIAGNOSIS — M6281 Muscle weakness (generalized): Secondary | ICD-10-CM | POA: Diagnosis not present

## 2020-08-20 DIAGNOSIS — Z9181 History of falling: Secondary | ICD-10-CM | POA: Diagnosis not present

## 2020-08-20 DIAGNOSIS — I878 Other specified disorders of veins: Secondary | ICD-10-CM | POA: Diagnosis not present

## 2020-08-20 DIAGNOSIS — D631 Anemia in chronic kidney disease: Secondary | ICD-10-CM | POA: Diagnosis not present

## 2020-08-20 HISTORY — PX: AV FISTULA PLACEMENT: SHX1204

## 2020-08-20 HISTORY — DX: Hyperlipidemia, unspecified: E78.5

## 2020-08-20 HISTORY — DX: Dysphagia, unspecified: R13.10

## 2020-08-20 LAB — POCT I-STAT, CHEM 8
BUN: 46 mg/dL — ABNORMAL HIGH (ref 8–23)
Calcium, Ion: 1.2 mmol/L (ref 1.15–1.40)
Chloride: 97 mmol/L — ABNORMAL LOW (ref 98–111)
Creatinine, Ser: 6.8 mg/dL — ABNORMAL HIGH (ref 0.44–1.00)
Glucose, Bld: 180 mg/dL — ABNORMAL HIGH (ref 70–99)
HCT: 31 % — ABNORMAL LOW (ref 36.0–46.0)
Hemoglobin: 10.5 g/dL — ABNORMAL LOW (ref 12.0–15.0)
Potassium: 3 mmol/L — ABNORMAL LOW (ref 3.5–5.1)
Sodium: 136 mmol/L (ref 135–145)
TCO2: 26 mmol/L (ref 22–32)

## 2020-08-20 LAB — GLUCOSE, CAPILLARY
Glucose-Capillary: 190 mg/dL — ABNORMAL HIGH (ref 70–99)
Glucose-Capillary: 134 mg/dL — ABNORMAL HIGH (ref 70–99)

## 2020-08-20 SURGERY — ARTERIOVENOUS (AV) FISTULA CREATION
Anesthesia: General | Site: Arm Upper | Laterality: Right

## 2020-08-20 MED ORDER — FENTANYL CITRATE (PF) 250 MCG/5ML IJ SOLN
INTRAMUSCULAR | Status: AC
  Filled 2020-08-20: qty 5

## 2020-08-20 MED ORDER — CHLORHEXIDINE GLUCONATE 4 % EX LIQD: 60.0000 mL | Freq: Once | CUTANEOUS | Status: DC

## 2020-08-20 MED ORDER — PROPOFOL 10 MG/ML IV BOLUS
INTRAVENOUS | Status: AC
  Filled 2020-08-20: qty 20

## 2020-08-20 MED ORDER — PANTOPRAZOLE SODIUM 40 MG PO TBEC
40.0000 mg | DELAYED_RELEASE_TABLET | Freq: Two times a day (BID) | ORAL | Status: DC
Start: 2020-08-20 — End: 2020-09-24

## 2020-08-20 MED ORDER — SUCCINYLCHOLINE CHLORIDE 200 MG/10ML IV SOSY
PREFILLED_SYRINGE | INTRAVENOUS | Status: DC | PRN
  Administered 2020-08-20: 120 mg via INTRAVENOUS

## 2020-08-20 MED ORDER — LIDOCAINE HCL (CARDIAC) PF 100 MG/5ML IV SOSY
PREFILLED_SYRINGE | INTRAVENOUS | Status: DC | PRN
  Administered 2020-08-20: 50 mg via INTRAVENOUS

## 2020-08-20 MED ORDER — CLONIDINE HCL 0.1 MG PO TABS: 0.1000 mg | ORAL_TABLET | Freq: Every day | ORAL | Status: DC

## 2020-08-20 MED ORDER — SODIUM CHLORIDE 0.9 % IV SOLN: INTRAVENOUS | Status: DC

## 2020-08-20 MED ORDER — SODIUM CHLORIDE 0.9 % IV SOLN
INTRAVENOUS | Status: AC
  Filled 2020-08-20: qty 1.2

## 2020-08-20 MED ORDER — PROPOFOL 10 MG/ML IV BOLUS
INTRAVENOUS | Status: DC | PRN
  Administered 2020-08-20: 100 mg via INTRAVENOUS

## 2020-08-20 MED ORDER — HEPARIN SODIUM (PORCINE) 1000 UNIT/ML IJ SOLN
1.9000 mL | Freq: Once | INTRAMUSCULAR | Status: AC
  Administered 2020-08-20: 1900 [IU] via INTRAVENOUS

## 2020-08-20 MED ORDER — FENTANYL CITRATE (PF) 100 MCG/2ML IJ SOLN: 25.0000 ug | INTRAMUSCULAR | Status: DC | PRN

## 2020-08-20 MED ORDER — HEPARIN SODIUM (PORCINE) 1000 UNIT/ML IJ SOLN
INTRAMUSCULAR | Status: AC
  Filled 2020-08-20: qty 1

## 2020-08-20 MED ORDER — ACETAMINOPHEN 500 MG PO TABS
1000.0000 mg | ORAL_TABLET | Freq: Once | ORAL | Status: AC
  Administered 2020-08-20: 1000 mg via ORAL
  Filled 2020-08-20: qty 2

## 2020-08-20 MED ORDER — ONDANSETRON HCL 4 MG/2ML IJ SOLN: 4.0000 mg | Freq: Once | INTRAMUSCULAR | Status: DC | PRN

## 2020-08-20 MED ORDER — VANCOMYCIN HCL IN DEXTROSE 1-5 GM/200ML-% IV SOLN
1000.0000 mg | INTRAVENOUS | Status: AC
  Administered 2020-08-20: 1000 mg via INTRAVENOUS
  Filled 2020-08-20: qty 200

## 2020-08-20 MED ORDER — SODIUM CHLORIDE 0.9 % IV SOLN
INTRAVENOUS | Status: DC | PRN
  Administered 2020-08-20: 500 mL

## 2020-08-20 MED ORDER — LIDOCAINE HCL (PF) 1 % IJ SOLN
INTRAMUSCULAR | Status: DC | PRN
  Administered 2020-08-20: 8 mL

## 2020-08-20 MED ORDER — FENTANYL CITRATE (PF) 250 MCG/5ML IJ SOLN
INTRAMUSCULAR | Status: DC | PRN
  Administered 2020-08-20: 50 ug via INTRAVENOUS

## 2020-08-20 MED ORDER — ATENOLOL 25 MG PO TABS: 25.0000 mg | ORAL_TABLET | Freq: Every morning | ORAL | Status: DC

## 2020-08-20 MED ORDER — CHLORHEXIDINE GLUCONATE 0.12 % MT SOLN
OROMUCOSAL | Status: AC
  Administered 2020-08-20: 15 mL via OROMUCOSAL
  Filled 2020-08-20: qty 15

## 2020-08-20 MED ORDER — PROTAMINE SULFATE 10 MG/ML IV SOLN
INTRAVENOUS | Status: AC
  Filled 2020-08-20: qty 25

## 2020-08-20 MED ORDER — LIDOCAINE HCL (PF) 1 % IJ SOLN
INTRAMUSCULAR | Status: AC
  Filled 2020-08-20: qty 30

## 2020-08-20 MED ORDER — ONDANSETRON HCL 4 MG/2ML IJ SOLN
INTRAMUSCULAR | Status: DC | PRN
  Administered 2020-08-20: 4 mg via INTRAVENOUS

## 2020-08-20 MED ORDER — CHLORHEXIDINE GLUCONATE 0.12 % MT SOLN
15.0000 mL | Freq: Once | OROMUCOSAL | Status: AC
  Filled 2020-08-20: qty 15

## 2020-08-20 MED ORDER — PHENYLEPHRINE 40 MCG/ML (10ML) SYRINGE FOR IV PUSH (FOR BLOOD PRESSURE SUPPORT)
PREFILLED_SYRINGE | INTRAVENOUS | Status: DC | PRN
  Administered 2020-08-20 (×2): 120 ug via INTRAVENOUS
  Administered 2020-08-20: 160 ug via INTRAVENOUS

## 2020-08-20 MED ORDER — HEPARIN SODIUM (PORCINE) 1000 UNIT/ML IJ SOLN: 1.8000 mL | Freq: Once | INTRAMUSCULAR | Status: DC

## 2020-08-20 MED ORDER — 0.9 % SODIUM CHLORIDE (POUR BTL) OPTIME
TOPICAL | Status: DC | PRN
  Administered 2020-08-20: 1000 mL

## 2020-08-20 MED ORDER — OXYCODONE-ACETAMINOPHEN 5-325 MG PO TABS: 1.0000 | ORAL_TABLET | Freq: Four times a day (QID) | ORAL | 0 refills | Status: DC | PRN

## 2020-08-20 MED ORDER — ANASTROZOLE 1 MG PO TABS: 1.0000 mg | ORAL_TABLET | Freq: Every morning | ORAL | Status: DC

## 2020-08-20 SURGICAL SUPPLY — 31 items
ADH SKN CLS APL DERMABOND .7 (GAUZE/BANDAGES/DRESSINGS) ×1
ARMBAND PINK RESTRICT EXTREMIT (MISCELLANEOUS) ×6 IMPLANT
CANISTER SUCT 3000ML PPV (MISCELLANEOUS) ×3 IMPLANT
CANNULA VESSEL 3MM 2 BLNT TIP (CANNULA) ×3 IMPLANT
CLIP VESOCCLUDE MED 6/CT (CLIP) ×3 IMPLANT
CLIP VESOCCLUDE SM WIDE 6/CT (CLIP) ×3 IMPLANT
COVER PROBE W GEL 5X96 (DRAPES) ×2 IMPLANT
DECANTER SPIKE VIAL GLASS SM (MISCELLANEOUS) ×3 IMPLANT
DERMABOND ADVANCED (GAUZE/BANDAGES/DRESSINGS) ×2
DERMABOND ADVANCED .7 DNX12 (GAUZE/BANDAGES/DRESSINGS) ×1 IMPLANT
DRAIN PENROSE 1/4X12 LTX STRL (WOUND CARE) ×3 IMPLANT
ELECT REM PT RETURN 9FT ADLT (ELECTROSURGICAL) ×3
ELECTRODE REM PT RTRN 9FT ADLT (ELECTROSURGICAL) ×1 IMPLANT
GLOVE BIO SURGEON STRL SZ7.5 (GLOVE) ×3 IMPLANT
GOWN STRL REUS W/ TWL LRG LVL3 (GOWN DISPOSABLE) ×3 IMPLANT
GOWN STRL REUS W/TWL LRG LVL3 (GOWN DISPOSABLE) ×9
KIT BASIN OR (CUSTOM PROCEDURE TRAY) ×3 IMPLANT
KIT TURNOVER KIT B (KITS) ×3 IMPLANT
LOOP VESSEL MINI RED (MISCELLANEOUS) IMPLANT
NS IRRIG 1000ML POUR BTL (IV SOLUTION) ×3 IMPLANT
PACK CV ACCESS (CUSTOM PROCEDURE TRAY) ×3 IMPLANT
PAD ARMBOARD 7.5X6 YLW CONV (MISCELLANEOUS) ×6 IMPLANT
SPONGE SURGIFOAM ABS GEL 100 (HEMOSTASIS) IMPLANT
SUT PROLENE 6 0 CC (SUTURE) ×3 IMPLANT
SUT PROLENE 7 0 BV 1 (SUTURE) IMPLANT
SUT VIC AB 3-0 SH 27 (SUTURE) ×3
SUT VIC AB 3-0 SH 27X BRD (SUTURE) ×1 IMPLANT
SUT VICRYL 4-0 PS2 18IN ABS (SUTURE) ×3 IMPLANT
TOWEL GREEN STERILE (TOWEL DISPOSABLE) ×3 IMPLANT
UNDERPAD 30X36 HEAVY ABSORB (UNDERPADS AND DIAPERS) ×3 IMPLANT
WATER STERILE IRR 1000ML POUR (IV SOLUTION) ×3 IMPLANT

## 2020-08-20 NOTE — Interval H&P Note (Signed)
History and Physical Interval Note:  08/20/2020 7:30 AM  Carly Wood  has presented today for surgery, with the diagnosis of ESRD.  The various methods of treatment have been discussed with the patient and family. After consideration of risks, benefits and other options for treatment, the patient has consented to  ligation of right arm AV fistula as a surgical intervention.  The patient's history has been reviewed, patient examined, no change in status, stable for surgery.  I have reviewed the patient's chart and labs.  Questions were answered to the patient's satisfaction.     Ruta Hinds

## 2020-08-20 NOTE — Transfer of Care (Signed)
Immediate Anesthesia Transfer of Care Note  Patient: Carly Wood  Procedure(s) Performed: ARTERIOVENOUS (AV) FISTULA LIGATION RIGHT ARM (Right: Arm Upper)  Patient Location: PACU  Anesthesia Type:General  Level of Consciousness: awake, alert  and patient cooperative  Airway & Oxygen Therapy: Patient Spontanous Breathing and Patient connected to face mask oxygen  Post-op Assessment: Report given to RN and Post -op Vital signs reviewed and stable  Post vital signs: Reviewed and stable  Last Vitals:  Vitals Value Taken Time  BP 158/65 08/20/20 1046  Temp    Pulse 66 08/20/20 1047  Resp 17 08/20/20 1047  SpO2 100 % 08/20/20 1047  Vitals shown include unvalidated device data.  Last Pain:  Vitals:   08/20/20 0735  TempSrc:   PainSc: 0-No pain         Complications: No notable events documented.

## 2020-08-20 NOTE — Progress Notes (Signed)
Patient stated several times she had a breakfast bar, grape juice, and chips this morning. This RN called Nursing home to verify; no answers at this time. Anesthesia MD made aware. Charge RN at nursing home to call short stay back with results.

## 2020-08-20 NOTE — Op Note (Signed)
Procedure: Ligation right arm AV fistula  Preop: venous hypertension right arm  Post op: same  Anesthesia: General  Asst: Leontine Locket PA-C  Procedure details:  Consent was obtained from pt siister.  General anesthetic was commenced.  Right arm was prepped and draped.  Local anesthesia 1% lidocaine was infiltrated at a pre existing scar at the antecubital crease.  A transverse incision was made through the scar and carried down to the fistula.  The vein was dissected free circumferentially just above the arterial anastomosis.  It was doubly ligated with a 2 0 silk tie.  The patient still had a radial pulse.  Hemostasis was obtained.  The subcutaneous tissue was closed with a 3 0 vicryl.  The skin was closed with 4 0 vicryl.  Dermabond was applied.  Ruta Hinds, MD [facility] Office: [phone]

## 2020-08-20 NOTE — Anesthesia Procedure Notes (Signed)
Procedure Name: Intubation Date/Time: 08/20/2020 10:02 AM Performed by: Jonna Munro, CRNA Pre-anesthesia Checklist: Patient identified, Patient being monitored, Timeout performed, Emergency Drugs available and Suction available Patient Re-evaluated:Patient Re-evaluated prior to induction Oxygen Delivery Method: Circle System Utilized Preoxygenation: Pre-oxygenation with 100% oxygen Induction Type: IV induction, Rapid sequence and Cricoid Pressure applied Ventilation: Mask ventilation without difficulty Laryngoscope Size: Mac and 3 Grade View: Grade II Tube type: Oral Tube size: 7.0 mm Number of attempts: 1 Airway Equipment and Method: stylet Placement Confirmation: ETT inserted through vocal cords under direct vision, positive ETCO2 and breath sounds checked- equal and bilateral Secured at: 21 cm Tube secured with: Tape Dental Injury: Teeth and Oropharynx as per pre-operative assessment

## 2020-08-20 NOTE — Anesthesia Preprocedure Evaluation (Addendum)
Anesthesia Evaluation  Patient identified by MRN, date of birth, ID band Patient awake    Reviewed: Allergy & Precautions, NPO status , Patient's Chart, lab work & pertinent test results, reviewed documented beta blocker date and time   Airway Mallampati: II  TM Distance: >3 FB Neck ROM: Full    Dental  (+) Dental Advisory Given, Poor Dentition, Missing   Pulmonary neg pulmonary ROS,    Pulmonary exam normal breath sounds clear to auscultation       Cardiovascular hypertension, Pt. on home beta blockers and Pt. on medications + Peripheral Vascular Disease  Normal cardiovascular exam Rhythm:Regular Rate:Normal     Neuro/Psych PSYCHIATRIC DISORDERS Anxiety Depression Dementia negative neurological ROS     GI/Hepatic Neg liver ROS, PUD, GERD  Medicated,  Endo/Other  diabetes, Type 2, Insulin Dependent  Renal/GU ESRF and DialysisRenal disease (MWF)     Musculoskeletal negative musculoskeletal ROS (+)   Abdominal   Peds  Hematology  (+) Blood dyscrasia, anemia ,   Anesthesia Other Findings Day of surgery medications reviewed with the patient.  Reproductive/Obstetrics                            Anesthesia Physical Anesthesia Plan  ASA: 3  Anesthesia Plan: General   Post-op Pain Management:    Induction: Intravenous  PONV Risk Score and Plan: 3 and Dexamethasone and Ondansetron  Airway Management Planned: LMA  Additional Equipment:   Intra-op Plan:   Post-operative Plan: Extubation in OR  Informed Consent: I have reviewed the patients History and Physical, chart, labs and discussed the procedure including the risks, benefits and alternatives for the proposed anesthesia with the patient or authorized representative who has indicated his/her understanding and acceptance.     Dental advisory given  Plan Discussed with: CRNA  Anesthesia Plan Comments:         Anesthesia  Quick Evaluation

## 2020-08-20 NOTE — Progress Notes (Signed)
Spoke with Marliss Coots, supervisor at patient's nursing home and stated she was unable to contact patient's night shift nurse. She also stated that the nurse in which the patient stated she received the breakfast bar, juice, and chips from was not staffed at the facility recently.  Dr. Gifford Shave made aware and stated ok to proceed for the procedure.

## 2020-08-20 NOTE — Anesthesia Postprocedure Evaluation (Signed)
Anesthesia Post Note  Patient: Carly Wood  Procedure(s) Performed: ARTERIOVENOUS (AV) FISTULA LIGATION RIGHT ARM (Right: Arm Upper)     Patient location during evaluation: PACU Anesthesia Type: General Level of consciousness: awake and alert Pain management: pain level controlled Vital Signs Assessment: post-procedure vital signs reviewed and stable Respiratory status: spontaneous breathing, nonlabored ventilation, respiratory function stable and patient connected to nasal cannula oxygen Cardiovascular status: blood pressure returned to baseline and stable Postop Assessment: no apparent nausea or vomiting Anesthetic complications: no   No notable events documented.  Last Vitals:  Vitals:   08/20/20 1100 08/20/20 1115  BP: (!) 162/52 (!) 166/55  Pulse: 65 72  Resp: 14 13  Temp:  (!) 36.2 C  SpO2: 99% 99%    Last Pain:  Vitals:   08/20/20 1115  TempSrc:   PainSc: 0-No pain                 Catalina Gravel

## 2020-08-20 NOTE — Discharge Instructions (Signed)
Patient has appointment for central venogram at [facility] on 08/22/2020 with Dr. Carlis Wurtzel.

## 2020-08-21 ENCOUNTER — Encounter (HOSPITAL_COMMUNITY): Payer: Self-pay | Admitting: Vascular Surgery

## 2020-08-21 DIAGNOSIS — M6281 Muscle weakness (generalized): Secondary | ICD-10-CM | POA: Diagnosis not present

## 2020-08-21 DIAGNOSIS — Z9181 History of falling: Secondary | ICD-10-CM | POA: Diagnosis not present

## 2020-08-21 DIAGNOSIS — N186 End stage renal disease: Secondary | ICD-10-CM | POA: Diagnosis not present

## 2020-08-21 DIAGNOSIS — R262 Difficulty in walking, not elsewhere classified: Secondary | ICD-10-CM | POA: Diagnosis not present

## 2020-08-21 DIAGNOSIS — S72141D Displaced intertrochanteric fracture of right femur, subsequent encounter for closed fracture with routine healing: Secondary | ICD-10-CM | POA: Diagnosis not present

## 2020-08-21 DIAGNOSIS — R2681 Unsteadiness on feet: Secondary | ICD-10-CM | POA: Diagnosis not present

## 2020-08-21 DIAGNOSIS — Z992 Dependence on renal dialysis: Secondary | ICD-10-CM | POA: Diagnosis not present

## 2020-08-22 ENCOUNTER — Inpatient Hospital Stay
Admission: RE | Admit: 2020-08-22 | Discharge: 2021-10-01 | Disposition: A | Discharge status: Nursing Home (Includes ICF) | Payer: Medicare PPO | Source: Ambulatory Visit | Attending: Internal Medicine | Admitting: Internal Medicine

## 2020-08-22 ENCOUNTER — Ambulatory Visit (HOSPITAL_COMMUNITY)
Admission: RE | Admit: 2020-08-22 | Discharge: 2020-08-22 | Disposition: A | Discharge status: Skilled Nursing Facility | Payer: Medicare PPO | Attending: Vascular Surgery | Admitting: Vascular Surgery

## 2020-08-22 ENCOUNTER — Encounter (HOSPITAL_COMMUNITY)
Admission: RE | Disposition: A | Discharge status: Skilled Nursing Facility | Payer: Self-pay | Source: Home / Self Care | Attending: Vascular Surgery

## 2020-08-22 ENCOUNTER — Other Ambulatory Visit: Payer: Self-pay

## 2020-08-22 DIAGNOSIS — R262 Difficulty in walking, not elsewhere classified: Secondary | ICD-10-CM | POA: Diagnosis not present

## 2020-08-22 DIAGNOSIS — E1122 Type 2 diabetes mellitus with diabetic chronic kidney disease: Secondary | ICD-10-CM | POA: Diagnosis not present

## 2020-08-22 DIAGNOSIS — I120 Hypertensive chronic kidney disease with stage 5 chronic kidney disease or end stage renal disease: Secondary | ICD-10-CM | POA: Insufficient documentation

## 2020-08-22 DIAGNOSIS — S72141D Displaced intertrochanteric fracture of right femur, subsequent encounter for closed fracture with routine healing: Secondary | ICD-10-CM | POA: Diagnosis not present

## 2020-08-22 DIAGNOSIS — Z880 Allergy status to penicillin: Secondary | ICD-10-CM | POA: Diagnosis not present

## 2020-08-22 DIAGNOSIS — Z888 Allergy status to other drugs, medicaments and biological substances status: Secondary | ICD-10-CM | POA: Insufficient documentation

## 2020-08-22 DIAGNOSIS — R112 Nausea with vomiting, unspecified: Secondary | ICD-10-CM

## 2020-08-22 DIAGNOSIS — Z9181 History of falling: Secondary | ICD-10-CM | POA: Diagnosis not present

## 2020-08-22 DIAGNOSIS — Z992 Dependence on renal dialysis: Secondary | ICD-10-CM | POA: Insufficient documentation

## 2020-08-22 DIAGNOSIS — K269 Duodenal ulcer, unspecified as acute or chronic, without hemorrhage or perforation: Principal | ICD-10-CM

## 2020-08-22 DIAGNOSIS — N186 End stage renal disease: Secondary | ICD-10-CM | POA: Diagnosis not present

## 2020-08-22 DIAGNOSIS — R2681 Unsteadiness on feet: Secondary | ICD-10-CM | POA: Diagnosis not present

## 2020-08-22 DIAGNOSIS — F0390 Unspecified dementia without behavioral disturbance: Secondary | ICD-10-CM | POA: Diagnosis not present

## 2020-08-22 DIAGNOSIS — M7989 Other specified soft tissue disorders: Secondary | ICD-10-CM | POA: Diagnosis not present

## 2020-08-22 DIAGNOSIS — M619 Calcification and ossification of muscle, unspecified: Secondary | ICD-10-CM

## 2020-08-22 DIAGNOSIS — M6281 Muscle weakness (generalized): Secondary | ICD-10-CM | POA: Diagnosis not present

## 2020-08-22 HISTORY — PX: UPPER EXTREMITY VENOGRAPHY: CATH118272

## 2020-08-22 LAB — POCT I-STAT, CHEM 8
BUN: 54 mg/dL — ABNORMAL HIGH (ref 8–23)
Calcium, Ion: 0.92 mmol/L — ABNORMAL LOW (ref 1.15–1.40)
Chloride: 101 mmol/L (ref 98–111)
Creatinine, Ser: 6.5 mg/dL — ABNORMAL HIGH (ref 0.44–1.00)
Glucose, Bld: 102 mg/dL — ABNORMAL HIGH (ref 70–99)
HCT: 32 % — ABNORMAL LOW (ref 36.0–46.0)
Hemoglobin: 10.9 g/dL — ABNORMAL LOW (ref 12.0–15.0)
Potassium: 5.5 mmol/L — ABNORMAL HIGH (ref 3.5–5.1)
Sodium: 130 mmol/L — ABNORMAL LOW (ref 135–145)
TCO2: 26 mmol/L (ref 22–32)

## 2020-08-22 LAB — GLUCOSE, CAPILLARY: Glucose-Capillary: 103 mg/dL — ABNORMAL HIGH (ref 70–99)

## 2020-08-22 SURGERY — UPPER EXTREMITY VENOGRAPHY
Anesthesia: LOCAL

## 2020-08-22 MED ORDER — SODIUM CHLORIDE 0.9% FLUSH: 3.0000 mL | Freq: Two times a day (BID) | INTRAVENOUS | Status: DC

## 2020-08-22 MED ORDER — SODIUM CHLORIDE 0.9% FLUSH: 3.0000 mL | INTRAVENOUS | Status: DC | PRN

## 2020-08-22 MED ORDER — SODIUM CHLORIDE 0.9 % IV SOLN: 250.0000 mL | INTRAVENOUS | Status: DC | PRN

## 2020-08-22 MED ORDER — IODIXANOL 320 MG/ML IV SOLN
INTRAVENOUS | Status: DC | PRN
  Administered 2020-08-22: 40 mL

## 2020-08-22 SURGICAL SUPPLY — 2 items
STOPCOCK MORSE 400PSI 3WAY (MISCELLANEOUS) ×1 IMPLANT
TUBING CIL FLEX 10 FLL-RA (TUBING) ×1 IMPLANT

## 2020-08-22 NOTE — Op Note (Addendum)
Date: August 22, 2020  Preoperative diagnosis: End-stage renal disease and need for permanent hemodialysis access  Postoperative diagnosis: Same  Procedure: Left upper extremity venogram including central venogram  Surgeon: Dr. Marty Heck, MD  Assistant: [facility] lab staff  Findings: Left upper extremity brachial veins as well as the axillary and subclavian veins appeared patent without any flow-limiting stenosis.  There is a left IJ catheter in place and its difficult to evaluate for more central stenosis in the left innominate vein but contrast does appear to empty centrally around the catheter without any difficulty.  Details: Patient was taken to the [facility] lab after she got a 22-gauge IV in the left forearm.  She was placed on the procedure table in the supine position.  A timeout was performed.  Ultimately a fluoroscopic C-arm was used and contrast was injected in the IV in her left upper extremity to evaluate her left upper extremity veins and central venous structures.  Pertinent findings are noted above.  She tolerated the procedure without any complication and taken to holding afterwards in stable condition.  Complication: None  Condition: Stable  Marty Heck, MD [facility] [place] Office: [place]

## 2020-08-22 NOTE — Progress Notes (Signed)
[place] called spoke with [facility] report given and updated on her appointment June 30 at 345pm with Dr Oneida Alar at [facility] and [place]

## 2020-08-22 NOTE — H&P (Signed)
History and Physical Interval Note:  08/22/2020 2:53 PM  [facility]  has presented today for surgery, with the diagnosis of instage renal.  The various methods of treatment have been discussed with the patient and family. After consideration of risks, benefits and other options for treatment, the patient has consented to  Procedure(s): LEFT UPPER & CENTRAL VENOGRAPHY (N/A) as a surgical intervention.  The patient's history has been reviewed, patient examined, no change in status, stable for surgery.  I have reviewed the patient's chart and labs.  Questions were answered to the patient's satisfaction.    Left upper extremity and central venogram  [facility]  Patient name: Carly Wood  MRN: [id number]        DOB: 1935/07/18        Sex: female   REASON FOR CONSULT: Hemodialysis access   HPI: Carly Wood is a 85 y.o. female, with known venous hypertension in her right upper extremity and central vein occlusion on the right side.  We had previously evaluated her and were considering ligation of the AV fistula and possible placement of a thigh graft.  The patient currently is dialyzing via left sided catheter.  She still has a patent fistula on the right side with a very swollen right arm.  She does have some mild dementia.  [facility] is [facility] the [facility].  Her dialysis day is Monday Wednesday Friday.  She has had a previous fistula in the left arm but I cannot find any record of any other access procedures on the left side.  She has not had a left central venogram recently.  She did have a right central venogram recently which showed the central vein findings as mentioned above.  She has had a prior left Port-A-Cath removed several years ago       Past Medical History:  Diagnosis Date   Anemia       chronic macrocytic anemia   Anxiety     Chronic kidney disease     Chronic renal disease, stage 4, severely decreased glomerular filtration rate (GFR) between 15-29  mL/min/1.73 square meter ([place]) 0/99/8338   Complication of anesthesia      delirious after Breast Surgery   Dementia ([place])      mild   Depression     Diabetes mellitus with ESRD (end-stage renal disease) ([place])      type II   Dyspnea      with activity   GERD (gastroesophageal reflux disease)     Glaucoma     Hypertension     Pneumonia     Stage 1 infiltrating ductal carcinoma of right female breast ([place]) 08/21/2015    ER+ PR+ HER 2 neu + (3+) T1cN0          Past Surgical History:  Procedure Laterality Date   A/V FISTULAGRAM Right 07/05/2020    Procedure: A/V Fistulagram;  Surgeon: Elam Dutch, MD;  Location: [place] [facility];  Service: Cardiovascular;  Laterality: Right;   AV FISTULA PLACEMENT Left 11/22/2017    Procedure: ARTERIOVENOUS (AV) FISTULA CREATION LEFT ARM;  Surgeon: Elam Dutch, MD;  Location: [facility] OR;  Service: Vascular;  Laterality: Left;   AV FISTULA PLACEMENT Right 04/04/2020    Procedure: RIGHT ARM ARTERIOVENOUS FISTULA CREATION;  Surgeon: Rosetta Posner, MD;  Location: [facility];  Service: Vascular;  Laterality: Right;   BIOPSY   08/07/2016    Procedure: BIOPSY;  Surgeon: Daneil Dolin, MD;  [facility];  Service: Endoscopy;;  gastric ulcer biopsy   COLONOSCOPY       ESOPHAGOGASTRODUODENOSCOPY N/A 08/07/2016    LA Grade A esophagitis s/p dilation, small hiatal hernia, multiple gastric ulcers and erosions, duodenal erosions s/p biopsy. Negative H.pylori   ESOPHAGOGASTRODUODENOSCOPY N/A 11/27/2016    normal esophagus, previously noted gastric ulcers completely healed, normal duodenum.   ESOPHAGOGASTRODUODENOSCOPY (EGD) WITH PROPOFOL N/A 07/23/2020    Procedure: ESOPHAGOGASTRODUODENOSCOPY (EGD) WITH PROPOFOL;  Surgeon: Rogene Houston, MD;  [facility];  Service: Endoscopy;  Laterality: N/A;   FISTULA SUPERFICIALIZATION Left 02/14/2018    Procedure: FISTULA SUPERFICIALIZATION LEFT ARM;  Surgeon: Angelia Mould, MD;  Location: [facility];   Service: Vascular;  Laterality: Left;   FRACTURE SURGERY Right      ankle   HOT HEMOSTASIS   07/23/2020    Procedure: HOT HEMOSTASIS (ARGON PLASMA COAGULATION/BICAP);  Surgeon: Rogene Houston, MD;  [facility];  Service: Endoscopy;;   INTRAMEDULLARY (IM) NAIL INTERTROCHANTERIC Right 07/12/2020    Procedure: INTRAMEDULLARY (IM) NAIL INTERTROCHANTRIC;  Surgeon: Mordecai Rasmussen, MD;  Location: [facility];  Service: Orthopedics;  Laterality: Right;   MALONEY DILATION N/A 08/07/2016    Procedure: Venia Minks DILATION;  Surgeon: Daneil Dolin, MD;  [facility];  Service: Endoscopy;  Laterality: N/A;   MASTECTOMY, PARTIAL Right     multiple myeloma       PERIPHERAL VASCULAR BALLOON ANGIOPLASTY Left 07/13/2019    Procedure: PERIPHERAL VASCULAR BALLOON ANGIOPLASTY;  Surgeon: Carly Heck, MD;  Location: [place] [facility];  Service: Cardiovascular;  Laterality: Left;  arm fistulogram   PERIPHERAL VASCULAR BALLOON ANGIOPLASTY Right 05/22/2020    Procedure: PERIPHERAL VASCULAR BALLOON ANGIOPLASTY;  Surgeon: Cherre Robins, MD;  Location: [place] [facility];  Service: Cardiovascular;  Laterality: Right;  arm fistula   PERIPHERAL VASCULAR BALLOON ANGIOPLASTY Right 07/05/2020    Procedure: PERIPHERAL VASCULAR BALLOON ANGIOPLASTY;  Surgeon: Elam Dutch, MD;  Location: [place] [facility];  Service: Cardiovascular;  Laterality: Right;  arm fistula   PORT-A-CATH REMOVAL Left 11/22/2017    Procedure: REMOVAL PORT-A-CATH LEFT CHEST;  Surgeon: Elam Dutch, MD;  Location: [facility] OR;  Service: Vascular;  Laterality: Left;   RETINAL DETACHMENT SURGERY Right     SCLEROTHERAPY   07/23/2020    Procedure: SCLEROTHERAPY;  Surgeon: Rogene Houston, MD;  [facility];  Service: Endoscopy;;           Family History  Problem Relation Age of Onset   Multiple myeloma Sister     Brain cancer Sister     Dementia Mother          died at 102   Stroke Mother     Heart failure Mother      Diabetes Mother     Heart disease Father     Prostate cancer Brother     Colon cancer Neg Hx        SOCIAL HISTORY: Social History         Socioeconomic History   Marital status: Single      Spouse name: Not on file   Number of children: Not on file   Years of education: Not on file   Highest education level: Not on file  Occupational History   Occupation: retired  Tobacco Use   Smoking status: Never   Smokeless tobacco: Never  Vaping Use   Vaping Use: Never used  Substance and Sexual  Activity   Alcohol use: No      Alcohol/week: 0.0 standard drinks   Drug use: No   Sexual activity: Never  Other Topics Concern   Not on file  Social History Narrative    Long term resident of SNF    Social Determinants of Health       Financial Resource Strain: Low Risk   Difficulty of Paying Living Expenses: Not very hard  Food Insecurity: No Food Insecurity   Worried About Charity fundraiser in the Last Year: Never true   Ran Out of Food in the Last Year: Never true  Transportation Needs: No Transportation Needs   Lack of Transportation (Medical): No   Lack of Transportation (Non-Medical): No  Physical Activity: Inactive   Days of Exercise per Week: 0 days   Minutes of Exercise per Session: 0 min  Stress: No Stress Concern Present   Feeling of Stress : Not at all  Social Connections: Moderately Isolated   Frequency of Communication with Friends and Family: More than three times a week   Frequency of Social Gatherings with Friends and Family: Once a week   Attends Religious Services: More than 4 times per year   Active Member of [employer] or Organizations: No   Attends Music therapist: Never   Marital Status: Never married  Human resources officer Violence: Not At Risk   Fear of Current or Ex-Partner: No   Emotionally Abused: No   Physically Abused: No   Sexually Abused: No           Allergies  Allergen Reactions   Ace Inhibitors Other (See Comments) and Cough       Tongue swell , ie angioedema   Angiotensin Receptor Blockers        Angioedema with ACE-I   Penicillins Other (See Comments)      Unsure of reaction Has patient had a PCN reaction causing immediate rash, facial/tongue/throat swelling, SOB or lightheadedness with hypotension: Unknown Has patient had a PCN reaction causing severe rash involving mucus membranes or skin necrosis: Unknown Has patient had a PCN reaction that required hospitalization: No Has patient had a PCN reaction occurring within the last 10 years: Unknown If all of the above answers are "NO", then may proceed with Cephalosporin use.          No current outpatient medications on file.    No current facility-administered medications for this visit.      ROS:   General:  No weight loss, Fever, chills   HEENT: No recent headaches, no nasal bleeding, no visual changes, no sore throat   Neurologic: No dizziness, blackouts, seizures. No recent symptoms of stroke or mini- stroke. No recent episodes of slurred speech, or temporary blindness.   Cardiac: No recent episodes of chest pain/pressure, no shortness of breath at rest.  No shortness of breath with exertion.  Denies history of atrial fibrillation or irregular heartbeat   Vascular: No history of rest pain in feet.  No history of claudication.  No history of non-healing ulcer, No history of DVT   Pulmonary: No home oxygen, no productive cough, no hemoptysis,  No asthma or wheezing   Musculoskeletal:  '[ ]'  Arthritis, '[ ]'  Low back pain,  '[ ]'  Joint pain   Hematologic:No history of hypercoagulable state.  No history of easy bleeding.  No history of anemia   Gastrointestinal: No hematochezia or melena,  No gastroesophageal reflux, no trouble swallowing   Urinary: '[X]'  chronic Kidney disease, [  X] on HD - '[X]'  MWF or '[ ]'  TTHS, '[ ]'  Burning with urination, '[ ]'  Frequent urination, '[ ]'  Difficulty urinating;   Skin: No rashes   Psychological: No history of anxiety,  No history  of depression     Physical Examination      Vitals:    08/15/20 0821  BP: (!) 144/50  Pulse: (!) 56  Resp: 16  Temp: 97.7 F (36.5 C)  TempSrc: Temporal  SpO2: 95%  Weight: 143 lb (64.9 kg)  Height: '5\' 3"'  (1.6 m)      Body mass index is 25.33 kg/m.   General:  Alert and oriented, no acute distress HEENT: Normal Neck: No JVD, left side dialysis catheter Pulmonary: Clear to auscultation bilaterally Cardiac: Regular Rate and Rhythm Extremity Pulses: Diffusely edematous right upper extremity difficult to palpate pulses patent pulsatile fistula right antecubital region, left upper extremity mid upper arm biceps scar 2+ left radial pulse Musculoskeletal: Right arm edema as mentioned above     Neurologic: Upper and lower extremity motor 5/5 and symmetric     ASSESSMENT: Patient has venous hypertension with arm swelling on the right side.  2.  Patient currently dialyzing via a left side dialysis catheter need to consider whether or not she would be a candidate for a left upper arm access versus thigh graft access.  Due to her dementia thigh graft access may be more difficult to consider.  She did seem to be alert and oriented today but does have some mild confusion.     PLAN: 1.  Ligation right arm AV fistula scheduled for Tuesday, August 20, 2020.  2.  Left central venogram scheduled with my partner Dr. Carlis Totzke for August 22, 2020.  3.  The patient will follow up with me in 3 weeks time for bilateral ABIs for consideration of left arm versus thigh graft access.     Ruta Hinds, MD [facility] Office: [phone]

## 2020-08-23 ENCOUNTER — Ambulatory Visit (INDEPENDENT_AMBULATORY_CARE_PROVIDER_SITE_OTHER): Payer: Medicare PPO | Admitting: Orthopedic Surgery

## 2020-08-23 ENCOUNTER — Encounter (HOSPITAL_COMMUNITY): Payer: Self-pay | Admitting: Vascular Surgery

## 2020-08-23 ENCOUNTER — Ambulatory Visit: Payer: Medicare PPO

## 2020-08-23 VITALS — BP 159/77 | HR 55 | Ht 63.5 in | Wt 148.0 lb

## 2020-08-23 DIAGNOSIS — T84194D Other mechanical complication of internal fixation device of right femur, subsequent encounter: Secondary | ICD-10-CM

## 2020-08-23 DIAGNOSIS — M25551 Pain in right hip: Secondary | ICD-10-CM

## 2020-08-23 DIAGNOSIS — N186 End stage renal disease: Secondary | ICD-10-CM | POA: Diagnosis not present

## 2020-08-23 DIAGNOSIS — Z992 Dependence on renal dialysis: Secondary | ICD-10-CM | POA: Diagnosis not present

## 2020-08-23 NOTE — Progress Notes (Signed)
Orthopaedic Postop Note  Assessment: Carly [facility] is a 85 y.o. female s/p cephalomedullary nail for right intretrochanteric fracture   DOS: 07/12/2020  Plan: Repeat radiographs obtained in clinic today, compared to previous x-rays demonstrate that there has been interval subsidence of the fracture.  Currently, there is no lag screw cut out, but it has clearly migrated to the superior and anterior aspect of the femoral head.  In addition, on the lateral x-ray of the hip, there has been interval displacement and the fracture has collapsed into some varus.  In direct comparison to x-rays obtained on May 20, these changes have happened since then.  On physical exam, she has no pain with movement of her hip.  She has no pain with axial loading.  Patient remains in a skilled nursing facility, but has baseline dementia.  She is a dialysis patient.  I am concerned that she will go on to a nonunion, and have further displacement at the fracture site.  She has been ambulating, albeit limited using a walker.  She will continue to limit her weightbearing status, and we will plan to see her back in clinic in approximately 6 weeks.  Pending the x-rays at that time, we may have to consider another procedure.  No family was present with her in clinic today.  No representatives from skilled nursing facility took part in the clinic visit today.   Follow-up: Return in about 6 weeks (around 10/04/2020). XR at next visit: AP pelvis, right hip x-ray  Subjective:  Chief Complaint  Patient presents with   Hip Pain    R/ it doing fairly well.    History of Present Illness: [facility] is a 85 y.o. female who presents following the above stated procedure.  She is now 6 weeks out from surgery, and continues to improve.  She was hospitalized for a GI bleed within the first couple weeks after surgery.  This has since resolved.  I saw her in the hospital during that hospitalization, staples were removed, and x-rays were  obtained which demonstrated excellent alignment of the right hip fracture.  Since then, she has been discharged to skilled nursing facility.  She has been ambulating with the assistance of a walker.  She has no pain in her right hip at this time.  No issues with her surgical incisions.  She does demonstrate some confusion in clinic today.  Review of Systems: No fevers or chills No numbness or tingling No Chest Pain No shortness of breath   Objective: BP (!) 159/77   Pulse (!) 55   Ht 5' 3.5" (1.613 m)   Wt 148 lb (67.1 kg) Comment: per patient  BMI 25.81 kg/m   Physical Exam:  Elderly female.  Seated in a wheelchair.  Some confusion.  Some answers are an inaudible.  Evaluation of the right hip demonstrates well-healed surgical incisions.  No tenderness along the lateral aspect of her right hip.  She requires some assistance to achieve a straight leg raise, but is able to maintain it once its completely straighten.  She tolerates gentle range of motion of the right hip without pain.  No pain with axial loading.  Active motion intact in the EHL/TA.  She states she has full sensation over the dorsum of her foot.  IMAGING: I personally ordered and reviewed the following images:  AP pelvis and right hip x-rays were obtained in clinic today, and compared to previous x-rays.  There has been some superior migration of the  lag screw.  The lag screw has not cut out.  In addition, the right hip fracture has collapsed into varus alignment, which is apparent on the lateral x-rays.  No evidence of hardware failure.  Impression: Right intertrochanteric fracture with cephalomedullary nail fixation with superior migration of the lag screw.   Carly Rasmussen, MD 08/23/2020 8:52 AM

## 2020-08-26 DIAGNOSIS — R2681 Unsteadiness on feet: Secondary | ICD-10-CM | POA: Diagnosis not present

## 2020-08-26 DIAGNOSIS — Z992 Dependence on renal dialysis: Secondary | ICD-10-CM | POA: Diagnosis not present

## 2020-08-26 DIAGNOSIS — N186 End stage renal disease: Secondary | ICD-10-CM | POA: Diagnosis not present

## 2020-08-26 DIAGNOSIS — Z9181 History of falling: Secondary | ICD-10-CM | POA: Diagnosis not present

## 2020-08-26 DIAGNOSIS — M6281 Muscle weakness (generalized): Secondary | ICD-10-CM | POA: Diagnosis not present

## 2020-08-26 DIAGNOSIS — S72141D Displaced intertrochanteric fracture of right femur, subsequent encounter for closed fracture with routine healing: Secondary | ICD-10-CM | POA: Diagnosis not present

## 2020-08-26 DIAGNOSIS — R262 Difficulty in walking, not elsewhere classified: Secondary | ICD-10-CM | POA: Diagnosis not present

## 2020-08-27 ENCOUNTER — Inpatient Hospital Stay (HOSPITAL_COMMUNITY): Payer: Medicare PPO

## 2020-08-27 ENCOUNTER — Inpatient Hospital Stay (HOSPITAL_BASED_OUTPATIENT_CLINIC_OR_DEPARTMENT_OTHER): Payer: Medicare PPO | Admitting: Hematology and Oncology

## 2020-08-27 VITALS — BP 162/92 | HR 65 | Temp 97.0°F | Resp 18

## 2020-08-27 DIAGNOSIS — M8580 Other specified disorders of bone density and structure, unspecified site: Secondary | ICD-10-CM | POA: Diagnosis not present

## 2020-08-27 DIAGNOSIS — C9000 Multiple myeloma not having achieved remission: Secondary | ICD-10-CM | POA: Diagnosis not present

## 2020-08-27 DIAGNOSIS — N186 End stage renal disease: Secondary | ICD-10-CM | POA: Diagnosis not present

## 2020-08-27 DIAGNOSIS — C50411 Malignant neoplasm of upper-outer quadrant of right female breast: Secondary | ICD-10-CM | POA: Diagnosis not present

## 2020-08-27 DIAGNOSIS — N184 Chronic kidney disease, stage 4 (severe): Secondary | ICD-10-CM | POA: Diagnosis not present

## 2020-08-27 DIAGNOSIS — Z992 Dependence on renal dialysis: Secondary | ICD-10-CM | POA: Diagnosis not present

## 2020-08-27 DIAGNOSIS — Z5111 Encounter for antineoplastic chemotherapy: Secondary | ICD-10-CM | POA: Diagnosis not present

## 2020-08-27 DIAGNOSIS — C9001 Multiple myeloma in remission: Secondary | ICD-10-CM

## 2020-08-27 DIAGNOSIS — Z79811 Long term (current) use of aromatase inhibitors: Secondary | ICD-10-CM | POA: Diagnosis not present

## 2020-08-27 LAB — COMPREHENSIVE METABOLIC PANEL
ALT: 10 U/L (ref 0–44)
AST: 17 U/L (ref 15–41)
Albumin: 3.4 g/dL — ABNORMAL LOW (ref 3.5–5.0)
Alkaline Phosphatase: 139 U/L — ABNORMAL HIGH (ref 38–126)
Anion gap: 12 (ref 5–15)
BUN: 36 mg/dL — ABNORMAL HIGH (ref 8–23)
CO2: 26 mmol/L (ref 22–32)
Calcium: 9.1 mg/dL (ref 8.9–10.3)
Chloride: 98 mmol/L (ref 98–111)
Creatinine, Ser: 6.38 mg/dL — ABNORMAL HIGH (ref 0.44–1.00)
GFR, Estimated: 6 mL/min — ABNORMAL LOW (ref >60)
Glucose, Bld: 191 mg/dL — ABNORMAL HIGH (ref 70–99)
Potassium: 3.1 mmol/L — ABNORMAL LOW (ref 3.5–5.1)
Sodium: 136 mmol/L (ref 135–145)
Total Bilirubin: 0.3 mg/dL (ref 0.3–1.2)
Total Protein: 6.3 g/dL — ABNORMAL LOW (ref 6.5–8.1)

## 2020-08-27 LAB — CBC WITH DIFFERENTIAL/PLATELET
Abs Immature Granulocytes: 0.02 10*3/uL (ref 0.00–0.07)
Basophils Absolute: 0.1 10*3/uL (ref 0.0–0.1)
Basophils Relative: 1 %
Eosinophils Absolute: 0.2 10*3/uL (ref 0.0–0.5)
Eosinophils Relative: 3 %
HCT: 35.3 % — ABNORMAL LOW (ref 36.0–46.0)
Hemoglobin: 10.7 g/dL — ABNORMAL LOW (ref 12.0–15.0)
Immature Granulocytes: 0 %
Lymphocytes Relative: 16 %
Lymphs Abs: 1 10*3/uL (ref 0.7–4.0)
MCH: 31.2 pg (ref 26.0–34.0)
MCHC: 30.3 g/dL (ref 30.0–36.0)
MCV: 102.9 fL — ABNORMAL HIGH (ref 80.0–100.0)
Monocytes Absolute: 0.5 10*3/uL (ref 0.1–1.0)
Monocytes Relative: 8 %
Neutro Abs: 4.4 10*3/uL (ref 1.7–7.7)
Neutrophils Relative %: 72 %
Platelets: 350 10*3/uL (ref 150–400)
RBC: 3.43 MIL/uL — ABNORMAL LOW (ref 3.87–5.11)
RDW: 18.6 % — ABNORMAL HIGH (ref 11.5–15.5)
WBC: 6.1 10*3/uL (ref 4.0–10.5)
nRBC: 0 % (ref 0.0–0.2)

## 2020-08-27 LAB — POCT I-STAT, CHEM 8
BUN: 62 mg/dL — ABNORMAL HIGH (ref 8–23)
Calcium, Ion: 1.01 mmol/L — ABNORMAL LOW (ref 1.15–1.40)
Chloride: 98 mmol/L (ref 98–111)
Creatinine, Ser: 6.3 mg/dL — ABNORMAL HIGH (ref 0.44–1.00)
Glucose, Bld: 103 mg/dL — ABNORMAL HIGH (ref 70–99)
HCT: 31 % — ABNORMAL LOW (ref 36.0–46.0)
Hemoglobin: 10.5 g/dL — ABNORMAL LOW (ref 12.0–15.0)
Potassium: 8 mmol/L (ref 3.5–5.1)
Sodium: 130 mmol/L — ABNORMAL LOW (ref 135–145)
TCO2: 29 mmol/L (ref 22–32)

## 2020-08-27 LAB — LACTATE DEHYDROGENASE: LDH: 166 U/L (ref 98–192)

## 2020-08-27 MED ORDER — PROCHLORPERAZINE MALEATE 10 MG PO TABS
10.0000 mg | ORAL_TABLET | Freq: Once | ORAL | Status: AC
  Administered 2020-08-27: 10 mg via ORAL

## 2020-08-27 MED ORDER — BORTEZOMIB CHEMO SQ INJECTION 3.5 MG (2.5MG/ML)
1.3000 mg/m2 | Freq: Once | INTRAMUSCULAR | Status: AC
  Administered 2020-08-27: 2.25 mg via SUBCUTANEOUS
  Filled 2020-08-27: qty 0.9

## 2020-08-27 MED ORDER — PROCHLORPERAZINE MALEATE 10 MG PO TABS
ORAL_TABLET | ORAL | Status: AC
  Filled 2020-08-27: qty 1

## 2020-08-27 MED ORDER — DEXAMETHASONE 4 MG PO TABS
ORAL_TABLET | ORAL | Status: AC
  Filled 2020-08-27: qty 3

## 2020-08-27 MED ORDER — DEXAMETHASONE 4 MG PO TABS
10.0000 mg | ORAL_TABLET | Freq: Once | ORAL | Status: AC
Start: 2020-08-27 — End: 2020-08-27
  Administered 2020-08-27: 10 mg via ORAL

## 2020-08-27 NOTE — Progress Notes (Signed)
[facility] presents today for injection per the provider's orders.  Velcade administration without incident; injection site WNL; see MAR for injection details.  Patient tolerated procedure well and without incident.  Remains stable during and after injection.  No questions or complaints noted at this time. Discharged via wheelchair in [place] staff member in stable condition.

## 2020-08-27 NOTE — Progress Notes (Signed)
Carly Wood, [place] [address]   CLINIC:  Medical Oncology/Hematology  PCP:  Carly Fee, NP [address] [place] [address] [phone]   REASON FOR VISIT:  Follow-up for plasma cell myeloma and anemia  PRIOR THERAPY: none  NGS Results: not done  CURRENT THERAPY: Velcade & Decadron 3/4 weeks  BRIEF ONCOLOGIC HISTORY:  Oncology History  Stage 1 infiltrating ductal carcinoma of right female breast ([place])  09/12/2014 Mammogram   Mass in upper outer R breast, middle third depth appears slightly larger than it was on prior exam with more irreg spiculated margins   10/02/2014 Pathology Results   biopsy with invasive ductal carcinoma high grade 1.1 cm, dcis solid type. additional R breast tissue, excision, invasive ductal high grade 0.9 cm    10/24/2014 Pathology Results   no residual invasive carcinoma, DCIS, focal, atypical ductal hyperplasia, 0/7 LN positive for metastatic carcinoma ER > 90%, PR 30%, HER 2 2+   10/24/2014 Cancer Staging   T1cN0M0   10/24/2014 Surgery   R mastectomy, T1c, N0   12/28/2014 - 11/22/2015 Chemotherapy   Taxol/herceptin weekly X 12, Herceptin every 21 days, last due in September 2017   03/11/2015 -  Anti-estrogen oral therapy   Arimidex 1 mg daily   09/12/2015 Imaging   MUGA- The left ventricular ejection fraction equals 68%.   06/08/2016 Treatment Plan Change   Started Nerlynx   Multiple myeloma ([place])  10/04/2018 Initial Diagnosis   Multiple myeloma ([place])   10/11/2018 -  Chemotherapy    Patient is on Treatment Plan: MYELOMA NON-TRANSPLANT CANDIDATES VRD WEEKLY D21D         CANCER STAGING: Cancer Staging Stage 1 infiltrating ductal carcinoma of right female breast [facility]) Staging form: Breast, AJCC 7th Edition - Clinical stage from 08/30/2015: Stage IA (T1c, N0, M0) - Signed by Baird Cancer, PA-C on 08/30/2015   INTERVAL HISTORY:  Carly Wood, a 85 y.o. female, returns for routine  follow-up of her  plasma cell myeloma and anemia. [place] was last seen on 07/30/2020.   On exam today Carly Wood reports that she has been "fine" in the interim since her last visit.  She does have some occasional numbness in her right hand that is not painful.  She notes that she is "eager to go home".  She does complain of having swollen feet and unfortunately had a recent fall when coming from dialysis and she hurt her knee.  She reports otherwise that she is not having any issues with fevers, chills, sweats, nausea, vomiting or diarrhea.  She is willing and able to proceed with Velcade therapy at this time.  REVIEW OF SYSTEMS:  Review of Systems  Constitutional:  Positive for fatigue (75%). Negative for appetite change.  All other systems reviewed and are negative.  PAST MEDICAL/SURGICAL HISTORY:  Past Medical History:  Diagnosis Date   Anemia     chronic macrocytic anemia   Anxiety    Chronic kidney disease    Chronic renal disease, stage 4, severely decreased glomerular filtration rate (GFR) between 15-29 mL/min/1.73 square meter ([place]) 67/20/9470   Complication of anesthesia    delirious after Breast Surgery   Dementia ([place])    mild   Depression    Diabetes mellitus with ESRD (end-stage renal disease) ([place])    type II   Dysphagia    Dyspnea    with activity   GERD (gastroesophageal reflux disease)  Glaucoma    Hyperlipidemia    Hypertension    Pneumonia    Stage 1 infiltrating ductal carcinoma of right female breast ([place]) 08/21/2015   ER+ PR+ HER 2 neu + (3+) T1cN0    Past Surgical History:  Procedure Laterality Date   A/V FISTULAGRAM Right 07/05/2020   Procedure: A/V Fistulagram;  Surgeon: Elam Dutch, MD;  Location: [facility];  Service: Cardiovascular;  Laterality: Right;   AV FISTULA PLACEMENT Left 11/22/2017   Procedure: ARTERIOVENOUS (AV) FISTULA CREATION LEFT ARM;  Surgeon: Elam Dutch, MD;  Location: [facility] OR;  Service: Vascular;  Laterality:  Left;   AV FISTULA PLACEMENT Right 04/04/2020   Procedure: RIGHT ARM ARTERIOVENOUS FISTULA CREATION;  Surgeon: Rosetta Posner, MD;  Location: [facility];  Service: Vascular;  Laterality: Right;   AV FISTULA PLACEMENT Right 08/20/2020   Procedure: ARTERIOVENOUS (AV) FISTULA LIGATION RIGHT ARM;  Surgeon: Elam Dutch, MD;  Location: [facility] OR;  Service: Vascular;  Laterality: Right;   BIOPSY  08/07/2016   Procedure: BIOPSY;  Surgeon: Daneil Dolin, MD;  Location: [facility];  Service: Endoscopy;;  gastric ulcer biopsy   COLONOSCOPY     ESOPHAGOGASTRODUODENOSCOPY N/A 08/07/2016   LA Grade A esophagitis s/p dilation, small hiatal hernia, multiple gastric ulcers and erosions, duodenal erosions s/p biopsy. Negative H.pylori    ESOPHAGOGASTRODUODENOSCOPY N/A 11/27/2016   normal esophagus, previously noted gastric ulcers completely healed, normal duodenum.    ESOPHAGOGASTRODUODENOSCOPY (EGD) WITH PROPOFOL N/A 07/23/2020   Procedure: ESOPHAGOGASTRODUODENOSCOPY (EGD) WITH PROPOFOL;  Surgeon: Rogene Houston, MD;  [facility];  Service: Endoscopy;  Laterality: N/A;   FISTULA SUPERFICIALIZATION Left 02/14/2018   Procedure: FISTULA SUPERFICIALIZATION LEFT ARM;  Surgeon: Angelia Mould, MD;  Location: [facility] OR;  Service: Vascular;  Laterality: Left;   FRACTURE SURGERY Right    ankle   HOT HEMOSTASIS  07/23/2020   Procedure: HOT HEMOSTASIS (ARGON PLASMA COAGULATION/BICAP);  Surgeon: Rogene Houston, MD;  [facility];  Service: Endoscopy;;   INTRAMEDULLARY (IM) NAIL INTERTROCHANTERIC Right 07/12/2020   Procedure: INTRAMEDULLARY (IM) NAIL INTERTROCHANTRIC;  Surgeon: Mordecai Rasmussen, MD;  Location: [facility];  Service: Orthopedics;  Laterality: Right;   MALONEY DILATION N/A 08/07/2016   Procedure: Venia Minks DILATION;  Surgeon: Daneil Dolin, MD;  [facility];  Service: Endoscopy;  Laterality: N/A;   MASTECTOMY, PARTIAL Right    multiple myeloma     PERIPHERAL VASCULAR BALLOON  ANGIOPLASTY Left 07/13/2019   Procedure: PERIPHERAL VASCULAR BALLOON ANGIOPLASTY;  Surgeon: Marty Heck, MD;  Location: [facility];  Service: Cardiovascular;  Laterality: Left;  arm fistulogram   PERIPHERAL VASCULAR BALLOON ANGIOPLASTY Right 05/22/2020   Procedure: PERIPHERAL VASCULAR BALLOON ANGIOPLASTY;  Surgeon: Cherre Robins, MD;  Location: [place] [facility];  Service: Cardiovascular;  Laterality: Right;  arm fistula   PERIPHERAL VASCULAR BALLOON ANGIOPLASTY Right 07/05/2020   Procedure: PERIPHERAL VASCULAR BALLOON ANGIOPLASTY;  Surgeon: Elam Dutch, MD;  Location: [place] [facility];  Service: Cardiovascular;  Laterality: Right;  arm fistula   PORT-A-CATH REMOVAL Left 11/22/2017   Procedure: REMOVAL PORT-A-CATH LEFT CHEST;  Surgeon: Elam Dutch, MD;  Location: [facility];  Service: Vascular;  Laterality: Left;   RETINAL DETACHMENT SURGERY Right    SCLEROTHERAPY  07/23/2020   Procedure: SCLEROTHERAPY;  Surgeon: Rogene Houston, MD;  [facility];  Service: Endoscopy;;   UPPER EXTREMITY VENOGRAPHY N/A 08/22/2020   Procedure: LEFT UPPER &  CENTRAL VENOGRAPHY;  Surgeon: Marty Heck, MD;  Location: [facility];  Service: Cardiovascular;  Laterality: N/A;    SOCIAL HISTORY:  Social History   Socioeconomic History   Marital status: Single    Spouse name: Not on file   Number of children: Not on file   Years of education: Not on file   Highest education level: Not on file  Occupational History   Occupation: retired   Tobacco Use   Smoking status: Never   Smokeless tobacco: Never  Vaping Use   Vaping Use: Never used  Substance and Sexual Activity   Alcohol use: No    Alcohol/week: 0.0 standard drinks   Drug use: No   Sexual activity: Never  Other Topics Concern   Not on file  Social History Narrative   Long term resident of SNF    Social Determinants of Health   Financial Resource Strain: Low Risk    Difficulty of Paying Living  Expenses: Not very hard  Food Insecurity: No Food Insecurity   Worried About Charity fundraiser in the Last Year: Never true   [place] in the Last Year: Never true  Transportation Needs: No Transportation Needs   Lack of Transportation (Medical): No   Lack of Transportation (Non-Medical): No  Physical Activity: Inactive   Days of Exercise per Week: 0 days   Minutes of Exercise per Session: 0 min  Stress: No Stress Concern Present   Feeling of Stress : Not at all  Social Connections: Moderately Isolated   Frequency of Communication with Friends and Family: More than three times a week   Frequency of Social Gatherings with Friends and Family: Once a week   Attends Religious Services: More than 4 times per year   Active Member of [employer] or Organizations: No   Attends Music therapist: Never   Marital Status: Never married  Human resources officer Violence: Not At Risk   Fear of Current or Ex-Partner: No   Emotionally Abused: No   Physically Abused: No   Sexually Abused: No    FAMILY HISTORY:  Family History  Problem Relation Age of Onset   Multiple myeloma Sister    Brain cancer Sister    Dementia Mother        died at 71   Stroke Mother    Heart failure Mother    Diabetes Mother    Heart disease Father    Prostate cancer Brother    Colon cancer Neg Hx     CURRENT MEDICATIONS:  No current outpatient medications on file.   No current facility-administered medications for this visit.    ALLERGIES:  Allergies  Allergen Reactions   Ace Inhibitors Other (See Comments) and Cough    Tongue swell , ie angioedema   Angiotensin Receptor Blockers     Angioedema with ACE-I   Penicillins Other (See Comments)    Unsure of reaction Has patient had a PCN reaction causing immediate rash, facial/tongue/throat swelling, SOB or lightheadedness with hypotension: Unknown Has patient had a PCN reaction causing severe rash involving mucus membranes or skin necrosis:  Unknown Has patient had a PCN reaction that required hospitalization: No Has patient had a PCN reaction occurring within the last 10 years: Unknown If all of the above answers are "NO", then may proceed with Cephalosporin use.      PHYSICAL EXAM:  Performance status (ECOG): 2 - Symptomatic, <50% confined to bed  Vitals:   08/27/20 1208  BP: (!) 162/92  Pulse: 65  Resp: 18  Temp: (!) 97 F (36.1 C)  SpO2: 96%   Wt Readings from Last 3 Encounters:  08/23/20 148 lb (67.1 kg)  08/22/20 148 lb (67.1 kg)  08/20/20 143 lb 1.1 oz (64.9 kg)   Physical Exam Vitals reviewed.  Constitutional:      Appearance: Normal appearance.  Cardiovascular:     Rate and Rhythm: Normal rate and regular rhythm.     Pulses: Normal pulses.     Heart sounds: Normal heart sounds.  Pulmonary:     Effort: Pulmonary effort is normal.     Breath sounds: Normal breath sounds.  Musculoskeletal:     Right lower leg: No edema.     Left lower leg: No edema.  Neurological:     General: No focal deficit present.     Mental Status: She is alert and oriented to person, place, and time.  Psychiatric:        Mood and Affect: Mood normal.        Behavior: Behavior normal.     LABORATORY DATA:  I have reviewed the labs as listed.  CBC Latest Ref Rng & Units 08/27/2020 08/22/2020 08/22/2020  WBC 4.0 - 10.5 K/uL 6.1 - -  Hemoglobin 12.0 - 15.0 g/dL 10.7(L) 10.9(L) 10.5(L)  Hematocrit 36.0 - 46.0 % 35.3(L) 32.0(L) 31.0(L)  Platelets 150 - 400 K/uL 350 - -   CMP Latest Ref Rng & Units 08/27/2020 08/22/2020 08/22/2020  Glucose 70 - 99 mg/dL 191(H) 102(H) 103(H)  BUN 8 - 23 mg/dL 36(H) 54(H) 62(H)  Creatinine 0.44 - 1.00 mg/dL 6.38(H) 6.50(H) 6.30(H)  Sodium 135 - 145 mmol/L 136 130(L) 130(L)  Potassium 3.5 - 5.1 mmol/L 3.1(L) 5.5(H) 8.0(HH)  Chloride 98 - 111 mmol/L 98 101 98  CO2 22 - 32 mmol/L 26 - -  Calcium 8.9 - 10.3 mg/dL 9.1 - -  Total Protein 6.5 - 8.1 g/dL 6.3(L) - -  Total Bilirubin 0.3 - 1.2 mg/dL  0.3 - -  Alkaline Phos 38 - 126 U/L 139(H) - -  AST 15 - 41 U/L 17 - -  ALT 0 - 44 U/L 10 - -    DIAGNOSTIC IMAGING:  I have independently reviewed the scans and discussed with the patient. PERIPHERAL VASCULAR CATHETERIZATION  Result Date: 08/22/2020 Formatting of this result is different from the original. Date: August 22, 2020   Preoperative diagnosis: End-stage renal disease and need for permanent hemodialysis access   Postoperative diagnosis: Same   Procedure: Left upper extremity venogram including central venogram   Surgeon: Dr. Marty Heck, MD   Assistant: [facility] lab staff   Findings: Left upper extremity brachial veins as well as the axillary and subclavian veins appeared patent without any flow-limiting stenosis.  There is a left IJ catheter in place and its difficult to evaluate for more central stenosis in the left innominate vein but contrast does appear to empty centrally around the catheter without any difficulty.   Details: Patient was taken to the [facility] lab after she got a 22-gauge IV in the left forearm.  She was placed on the procedure table in the supine position.  A timeout was performed.  Ultimately a fluoroscopic C-arm was used and contrast was injected in the IV in her left upper extremity to evaluate her left upper extremity veins and central venous structures.  Pertinent findings are noted above.  She tolerated the procedure without any complication and taken to holding afterwards in  stable condition.   Complication: None   Condition: Stable   Marty Heck, MD [facility] Office: [phone]  DG HIP UNILAT WITH PELVIS 2-3 VIEWS RIGHT  Result Date: 08/27/2020 Formatting of this result is different from the original. AP pelvis and right hip x-rays were obtained in clinic today, and compared to previous x-rays.  There has been some superior migration of the lag screw.  The lag screw has not cut out.  In addition, the right hip fracture has  collapsed into varus alignment, which is apparent on the lateral x-rays.  No evidence of hardware failure.   Impression: Right intertrochanteric fracture with cephalomedullary nail fixation with superior migration of the lag screw.     ASSESSMENT:  1.  IgA lambda plasma cell myeloma, stage II, standard risk: -BMBX on 09/20/2018 shows plasma cell myeloma, 30% plasma cells.  FISH panel was normal.  Chromosome analysis normal. -Labs at diagnosis on 08/29/2018 with M spike 0.9 g.  Kappa light chains 148, lambda light chains 1132, ratio 0.13.  LDH normal.  Beta-2 microglobulin 13.5.  She had transfusion dependent anemia. -Velcade and dexamethasone started on 10/11/2018. -Myeloma panel on 07/11/2019 shows SPEP is negative.  Free light chain ratio improved to 1.09.  Lambda light chains are 128, improved from 141.  Kappa light chains are 140. -She has been resident at [facility] 10/12[facility]2019.   2.  Stage I IDC of the right breast, ER/PR positive, HER-2 negative: -She is on anastrozole.   PLAN:  1.  IgA lambda plasma cell myeloma: -She was recently hospitalized due to GI bleed and received blood transfusion. - Today she is feeling reasonably well.  She is doing physical therapy.  She denies any bleeding per rectum or melena. - Reviewed labs from today which showed normal LFTs.  Other electrolytes were normal.  Hemoglobin was 10.7 with normal white count and platelet count - Recommend proceeding with Velcade today.  We will reevaluate her in 4 weeks.   2.  ESRD on HD: -She received dialysis yesterday.  Continue HD on Monday, Wednesday and Friday.   3.  Bone strengthening: -Bisphosphonates were not started due to patient being on dialysis.   4.  Osteopenia: -We will plan to repeat DEXA scan in the future.  Last bone density in 2017 was osteopenia.   5.  Right breast cancer: -She will continue anastrozole.   Orders placed this encounter:  No orders of the defined types were placed in this  encounter.  Ledell Peoples, MD Department of Hematology/Oncology [place] at [facility] Phone: [phone] Pager: [phone] Email: [email or web address]

## 2020-08-27 NOTE — Progress Notes (Signed)
Labs reviewed today with Dr. Lorenso Courier. Will proceed as planned.

## 2020-08-27 NOTE — Patient Instructions (Signed)
[facility]  Discharge Instructions: Thank you for choosing [facility] to provide your oncology and hematology care.  If you have a lab appointment with the Cancer Center, please come in thru the Main Entrance and check in at the main information desk.  Wear comfortable clothing and clothing appropriate for easy access to any Portacath or PICC line.   We strive to give you quality time with your provider. You may need to reschedule your appointment if you arrive late (15 or more minutes).  Arriving late affects you and other patients whose appointments are after yours.  Also, if you miss three or more appointments without notifying the office, you may be dismissed from the clinic at the provider's discretion.      For prescription refill requests, have your pharmacy contact our office and allow 72 hours for refills to be completed.    Today you received the following chemotherapy and/or immunotherapy agents Velcade.       To help prevent nausea and vomiting after your treatment, we encourage you to take your nausea medication as directed.  BELOW ARE SYMPTOMS THAT SHOULD BE REPORTED IMMEDIATELY: . *FEVER GREATER THAN 100.4 F (38 C) OR HIGHER . *CHILLS OR SWEATING . *NAUSEA AND VOMITING THAT IS NOT CONTROLLED WITH YOUR NAUSEA MEDICATION . *UNUSUAL SHORTNESS OF BREATH . *UNUSUAL BRUISING OR BLEEDING . *URINARY PROBLEMS (pain or burning when urinating, or frequent urination) . *BOWEL PROBLEMS (unusual diarrhea, constipation, pain near the anus) . TENDERNESS IN MOUTH AND THROAT WITH OR WITHOUT PRESENCE OF ULCERS (sore throat, sores in mouth, or a toothache) . UNUSUAL RASH, SWELLING OR PAIN  . UNUSUAL VAGINAL DISCHARGE OR ITCHING   Items with * indicate a potential emergency and should be followed up as soon as possible or go to the Emergency Department if any problems should occur.  Please show the CHEMOTHERAPY ALERT CARD or IMMUNOTHERAPY ALERT CARD at check-in to  the Emergency Department and triage nurse.  Should you have questions after your visit or need to cancel or reschedule your appointment, please contact [facility] [phone]  and follow the prompts.  Office hours are 8:00 a.m. to 4:30 p.m. Monday - Friday. Please note that voicemails left after 4:00 p.m. may not be returned until the following business day.  We are closed weekends and major holidays. You have access to a nurse at all times for urgent questions. Please call the main number to the clinic [phone] and follow the prompts.  For any non-urgent questions, you may also contact your provider using MyChart. We now offer e-Visits for anyone 18 and older to request care online for non-urgent symptoms. For details visit [email or web address].   Also download the MyChart app! Go to the app store, search "MyChart", open the app, select , and log in with your MyChart username and password.  Due to Covid, a mask is required upon entering the hospital/clinic. If you do not have a mask, one will be given to you upon arrival. For doctor visits, patients may have 1 support person aged 18 or older with them. For treatment visits, patients cannot have anyone with them due to current Covid guidelines and our immunocompromised population.  

## 2020-08-28 ENCOUNTER — Other Ambulatory Visit (HOSPITAL_COMMUNITY): Payer: Self-pay

## 2020-08-28 DIAGNOSIS — N186 End stage renal disease: Secondary | ICD-10-CM | POA: Diagnosis not present

## 2020-08-28 DIAGNOSIS — C9000 Multiple myeloma not having achieved remission: Secondary | ICD-10-CM

## 2020-08-28 DIAGNOSIS — Z992 Dependence on renal dialysis: Secondary | ICD-10-CM | POA: Diagnosis not present

## 2020-08-28 LAB — PROTEIN ELECTROPHORESIS, SERUM
A/G Ratio: 1.7 (ref 0.7–1.7)
Albumin ELP: 3.7 g/dL (ref 2.9–4.4)
Alpha-1-Globulin: 0.2 g/dL (ref 0.0–0.4)
Alpha-2-Globulin: 0.8 g/dL (ref 0.4–1.0)
Beta Globulin: 0.8 g/dL (ref 0.7–1.3)
Gamma Globulin: 0.4 g/dL (ref 0.4–1.8)
Globulin, Total: 2.2 g/dL (ref 2.2–3.9)
Total Protein ELP: 5.9 g/dL — ABNORMAL LOW (ref 6.0–8.5)

## 2020-08-28 LAB — KAPPA/LAMBDA LIGHT CHAINS
Kappa free light chain: 133.4 mg/L — ABNORMAL HIGH (ref 3.3–19.4)
Kappa, lambda light chain ratio: 0.51 (ref 0.26–1.65)
Lambda free light chains: 261.5 mg/L — ABNORMAL HIGH (ref 5.7–26.3)

## 2020-08-28 MED ORDER — DENOSUMAB 120 MG/1.7ML ~~LOC~~ SOLN
SUBCUTANEOUS | Status: AC
  Filled 2020-08-28: qty 1.7

## 2020-08-29 LAB — IMMUNOFIXATION ELECTROPHORESIS
IgA: 214 mg/dL (ref 64–422)
IgG (Immunoglobin G), Serum: 574 mg/dL — ABNORMAL LOW (ref 586–1602)
IgM (Immunoglobulin M), Srm: 10 mg/dL — ABNORMAL LOW (ref 26–217)
Total Protein ELP: 5.7 g/dL — ABNORMAL LOW (ref 6.0–8.5)

## 2020-08-30 DIAGNOSIS — N186 End stage renal disease: Secondary | ICD-10-CM | POA: Diagnosis not present

## 2020-08-30 DIAGNOSIS — Z992 Dependence on renal dialysis: Secondary | ICD-10-CM | POA: Diagnosis not present

## 2020-09-02 ENCOUNTER — Other Ambulatory Visit (HOSPITAL_COMMUNITY): Payer: Self-pay | Admitting: *Deleted

## 2020-09-02 DIAGNOSIS — N186 End stage renal disease: Secondary | ICD-10-CM | POA: Diagnosis not present

## 2020-09-02 DIAGNOSIS — E119 Type 2 diabetes mellitus without complications: Secondary | ICD-10-CM | POA: Diagnosis not present

## 2020-09-02 DIAGNOSIS — D509 Iron deficiency anemia, unspecified: Secondary | ICD-10-CM

## 2020-09-02 DIAGNOSIS — Z992 Dependence on renal dialysis: Secondary | ICD-10-CM | POA: Diagnosis not present

## 2020-09-02 DIAGNOSIS — N184 Chronic kidney disease, stage 4 (severe): Secondary | ICD-10-CM

## 2020-09-02 DIAGNOSIS — C9000 Multiple myeloma not having achieved remission: Secondary | ICD-10-CM

## 2020-09-03 ENCOUNTER — Inpatient Hospital Stay (HOSPITAL_COMMUNITY): Payer: Medicare PPO

## 2020-09-03 ENCOUNTER — Other Ambulatory Visit: Payer: Self-pay

## 2020-09-03 VITALS — BP 149/58 | HR 60 | Temp 97.0°F | Resp 18

## 2020-09-03 DIAGNOSIS — N186 End stage renal disease: Secondary | ICD-10-CM | POA: Diagnosis not present

## 2020-09-03 DIAGNOSIS — C9000 Multiple myeloma not having achieved remission: Secondary | ICD-10-CM

## 2020-09-03 DIAGNOSIS — C50411 Malignant neoplasm of upper-outer quadrant of right female breast: Secondary | ICD-10-CM | POA: Diagnosis not present

## 2020-09-03 DIAGNOSIS — N184 Chronic kidney disease, stage 4 (severe): Secondary | ICD-10-CM

## 2020-09-03 DIAGNOSIS — M8580 Other specified disorders of bone density and structure, unspecified site: Secondary | ICD-10-CM | POA: Diagnosis not present

## 2020-09-03 DIAGNOSIS — Z992 Dependence on renal dialysis: Secondary | ICD-10-CM | POA: Diagnosis not present

## 2020-09-03 DIAGNOSIS — Z5111 Encounter for antineoplastic chemotherapy: Secondary | ICD-10-CM | POA: Diagnosis not present

## 2020-09-03 DIAGNOSIS — Z79811 Long term (current) use of aromatase inhibitors: Secondary | ICD-10-CM | POA: Diagnosis not present

## 2020-09-03 DIAGNOSIS — D509 Iron deficiency anemia, unspecified: Secondary | ICD-10-CM

## 2020-09-03 LAB — CBC WITH DIFFERENTIAL/PLATELET
Abs Immature Granulocytes: 0.02 10*3/uL (ref 0.00–0.07)
Basophils Absolute: 0 10*3/uL (ref 0.0–0.1)
Basophils Relative: 1 %
Eosinophils Absolute: 0.2 10*3/uL (ref 0.0–0.5)
Eosinophils Relative: 4 %
HCT: 37.3 % (ref 36.0–46.0)
Hemoglobin: 11.2 g/dL — ABNORMAL LOW (ref 12.0–15.0)
Immature Granulocytes: 0 %
Lymphocytes Relative: 17 %
Lymphs Abs: 1 10*3/uL (ref 0.7–4.0)
MCH: 30.9 pg (ref 26.0–34.0)
MCHC: 30 g/dL (ref 30.0–36.0)
MCV: 102.8 fL — ABNORMAL HIGH (ref 80.0–100.0)
Monocytes Absolute: 0.6 10*3/uL (ref 0.1–1.0)
Monocytes Relative: 10 %
Neutro Abs: 4 10*3/uL (ref 1.7–7.7)
Neutrophils Relative %: 68 %
Platelets: 274 10*3/uL (ref 150–400)
RBC: 3.63 MIL/uL — ABNORMAL LOW (ref 3.87–5.11)
RDW: 18.3 % — ABNORMAL HIGH (ref 11.5–15.5)
WBC: 5.8 10*3/uL (ref 4.0–10.5)
nRBC: 0 % (ref 0.0–0.2)

## 2020-09-03 LAB — COMPREHENSIVE METABOLIC PANEL
ALT: 9 U/L (ref 0–44)
AST: 17 U/L (ref 15–41)
Albumin: 3.4 g/dL — ABNORMAL LOW (ref 3.5–5.0)
Alkaline Phosphatase: 138 U/L — ABNORMAL HIGH (ref 38–126)
Anion gap: 10 (ref 5–15)
BUN: 38 mg/dL — ABNORMAL HIGH (ref 8–23)
CO2: 28 mmol/L (ref 22–32)
Calcium: 9.2 mg/dL (ref 8.9–10.3)
Chloride: 100 mmol/L (ref 98–111)
Creatinine, Ser: 5.72 mg/dL — ABNORMAL HIGH (ref 0.44–1.00)
GFR, Estimated: 7 mL/min — ABNORMAL LOW (ref >60)
Glucose, Bld: 175 mg/dL — ABNORMAL HIGH (ref 70–99)
Potassium: 3.1 mmol/L — ABNORMAL LOW (ref 3.5–5.1)
Sodium: 138 mmol/L (ref 135–145)
Total Bilirubin: 0.4 mg/dL (ref 0.3–1.2)
Total Protein: 6 g/dL — ABNORMAL LOW (ref 6.5–8.1)

## 2020-09-03 LAB — MAGNESIUM: Magnesium: 2 mg/dL (ref 1.7–2.4)

## 2020-09-03 LAB — LACTATE DEHYDROGENASE: LDH: 156 U/L (ref 98–192)

## 2020-09-03 MED ORDER — DEXAMETHASONE 4 MG PO TABS
10.0000 mg | ORAL_TABLET | Freq: Once | ORAL | Status: AC
Start: 2020-09-03 — End: 2020-09-03
  Administered 2020-09-03: 10 mg via ORAL
  Filled 2020-09-03: qty 3

## 2020-09-03 MED ORDER — PROCHLORPERAZINE MALEATE 10 MG PO TABS
10.0000 mg | ORAL_TABLET | Freq: Once | ORAL | Status: AC
  Administered 2020-09-03: 10 mg via ORAL
  Filled 2020-09-03: qty 1

## 2020-09-03 MED ORDER — BORTEZOMIB CHEMO SQ INJECTION 3.5 MG (2.5MG/ML)
1.3000 mg/m2 | Freq: Once | INTRAMUSCULAR | Status: AC
  Administered 2020-09-03: 2.25 mg via SUBCUTANEOUS
  Filled 2020-09-03: qty 0.9

## 2020-09-03 NOTE — Progress Notes (Signed)
Potassium 3.1.  okay for treatment per Dr Lorenso Courier for velcade today.

## 2020-09-03 NOTE — Progress Notes (Signed)
Pt tolerated treatment well without incidence. Pt stable during and after treatment. Pt discharged in satisfactory in stable condition via wheelchair.

## 2020-09-04 DIAGNOSIS — N186 End stage renal disease: Secondary | ICD-10-CM | POA: Diagnosis not present

## 2020-09-04 DIAGNOSIS — C9000 Multiple myeloma not having achieved remission: Secondary | ICD-10-CM | POA: Diagnosis not present

## 2020-09-04 DIAGNOSIS — Z992 Dependence on renal dialysis: Secondary | ICD-10-CM | POA: Diagnosis not present

## 2020-09-04 DIAGNOSIS — Z1159 Encounter for screening for other viral diseases: Secondary | ICD-10-CM | POA: Diagnosis not present

## 2020-09-04 LAB — KAPPA/LAMBDA LIGHT CHAINS
Kappa free light chain: 116.8 mg/L — ABNORMAL HIGH (ref 3.3–19.4)
Kappa, lambda light chain ratio: 0.51 (ref 0.26–1.65)
Lambda free light chains: 228.5 mg/L — ABNORMAL HIGH (ref 5.7–26.3)

## 2020-09-04 MED ORDER — DENOSUMAB 120 MG/1.7ML ~~LOC~~ SOLN
SUBCUTANEOUS | Status: AC
  Filled 2020-09-04: qty 1.7

## 2020-09-04 MED ORDER — PEGFILGRASTIM-JMDB 6 MG/0.6ML ~~LOC~~ SOSY
PREFILLED_SYRINGE | SUBCUTANEOUS | Status: AC
  Filled 2020-09-04: qty 0.6

## 2020-09-04 MED ORDER — LANREOTIDE ACETATE 120 MG/0.5ML ~~LOC~~ SOLN
SUBCUTANEOUS | Status: AC
  Filled 2020-09-04: qty 120

## 2020-09-05 ENCOUNTER — Ambulatory Visit (HOSPITAL_COMMUNITY)
Admission: RE | Admit: 2020-09-05 | Discharge: 2020-09-05 | Disposition: A | Discharge status: Home / Self Care | Payer: Medicare PPO | Source: Ambulatory Visit | Attending: Vascular Surgery | Admitting: Vascular Surgery

## 2020-09-05 ENCOUNTER — Encounter: Payer: Self-pay | Admitting: Vascular Surgery

## 2020-09-05 ENCOUNTER — Ambulatory Visit (INDEPENDENT_AMBULATORY_CARE_PROVIDER_SITE_OTHER): Payer: Medicare PPO | Admitting: Vascular Surgery

## 2020-09-05 VITALS — BP 164/77 | HR 53 | Temp 98.0°F | Resp 20 | Ht 63.5 in | Wt 148.0 lb

## 2020-09-05 DIAGNOSIS — N186 End stage renal disease: Secondary | ICD-10-CM | POA: Diagnosis not present

## 2020-09-05 DIAGNOSIS — I70209 Unspecified atherosclerosis of native arteries of extremities, unspecified extremity: Secondary | ICD-10-CM | POA: Diagnosis not present

## 2020-09-05 DIAGNOSIS — Z992 Dependence on renal dialysis: Secondary | ICD-10-CM

## 2020-09-05 NOTE — Progress Notes (Signed)
Patient is an 85 year old female who returns for follow-up today.  She recently underwent ligation of a right upper arm AV fistula for venous hypertension and subclavian vein occlusion.  Her right arm swelling has improved but not completely resolved.  She currently is dialyzing via left sided catheter.  We were considering whether or not to place a thigh graft or a new upper arm graft in her but she currently does not wish to have any further access procedures.  Her sister who is her power of attorney did not want any further consideration for a thigh graft.  Past Medical History:  Diagnosis Date   Anemia     chronic macrocytic anemia   Anxiety    Chronic kidney disease    Chronic renal disease, stage 4, severely decreased glomerular filtration rate (GFR) between 15-29 mL/min/1.73 square meter ([place]) 89/38/1017   Complication of anesthesia    delirious after Breast Surgery   Dementia ([place])    mild   Depression    Diabetes mellitus with ESRD (end-stage renal disease) ([place])    type II   Dysphagia    Dyspnea    with activity   GERD (gastroesophageal reflux disease)    Glaucoma    Hyperlipidemia    Hypertension    Pneumonia    Stage 1 infiltrating ductal carcinoma of right female breast ([place]) 08/21/2015   ER+ PR+ HER 2 neu + (3+) T1cN0     Past Surgical History:  Procedure Laterality Date   A/V FISTULAGRAM Right 07/05/2020   Procedure: A/V Fistulagram;  Surgeon: Elam Dutch, MD;  Location: [place] [facility];  Service: Cardiovascular;  Laterality: Right;   AV FISTULA PLACEMENT Left 11/22/2017   Procedure: ARTERIOVENOUS (AV) FISTULA CREATION LEFT ARM;  Surgeon: Elam Dutch, MD;  Location: [facility] OR;  Service: Vascular;  Laterality: Left;   AV FISTULA PLACEMENT Right 04/04/2020   Procedure: RIGHT ARM ARTERIOVENOUS FISTULA CREATION;  Surgeon: Rosetta Posner, MD;  Location: [facility];  Service: Vascular;  Laterality: Right;   AV FISTULA PLACEMENT Right 08/20/2020   Procedure:  ARTERIOVENOUS (AV) FISTULA LIGATION RIGHT ARM;  Surgeon: Elam Dutch, MD;  Location: [facility] OR;  Service: Vascular;  Laterality: Right;   BIOPSY  08/07/2016   Procedure: BIOPSY;  Surgeon: Daneil Dolin, MD;  [facility];  Service: Endoscopy;;  gastric ulcer biopsy   COLONOSCOPY     ESOPHAGOGASTRODUODENOSCOPY N/A 08/07/2016   LA Grade A esophagitis s/p dilation, small hiatal hernia, multiple gastric ulcers and erosions, duodenal erosions s/p biopsy. Negative H.pylori    ESOPHAGOGASTRODUODENOSCOPY N/A 11/27/2016   normal esophagus, previously noted gastric ulcers completely healed, normal duodenum.    ESOPHAGOGASTRODUODENOSCOPY (EGD) WITH PROPOFOL N/A 07/23/2020   Procedure: ESOPHAGOGASTRODUODENOSCOPY (EGD) WITH PROPOFOL;  Surgeon: Rogene Houston, MD;  [facility];  Service: Endoscopy;  Laterality: N/A;   FISTULA SUPERFICIALIZATION Left 02/14/2018   Procedure: FISTULA SUPERFICIALIZATION LEFT ARM;  Surgeon: Angelia Mould, MD;  Location: [facility] OR;  Service: Vascular;  Laterality: Left;   FRACTURE SURGERY Right    ankle   HOT HEMOSTASIS  07/23/2020   Procedure: HOT HEMOSTASIS (ARGON PLASMA COAGULATION/BICAP);  Surgeon: Rogene Houston, MD;  [facility];  Service: Endoscopy;;   INTRAMEDULLARY (IM) NAIL INTERTROCHANTERIC Right 07/12/2020   Procedure: INTRAMEDULLARY (IM) NAIL INTERTROCHANTRIC;  Surgeon: Mordecai Rasmussen, MD;  Location: [facility];  Service: Orthopedics;  Laterality: Right;   MALONEY DILATION N/A 08/07/2016   Procedure: MALONEY DILATION;  Surgeon:  Rourk, Cristopher Estimable, MD;  [facility];  Service: Endoscopy;  Laterality: N/A;   MASTECTOMY, PARTIAL Right    multiple myeloma     PERIPHERAL VASCULAR BALLOON ANGIOPLASTY Left 07/13/2019   Procedure: PERIPHERAL VASCULAR BALLOON ANGIOPLASTY;  Surgeon: Marty Heck, MD;  Location: [place] [facility];  Service: Cardiovascular;  Laterality: Left;  arm fistulogram   PERIPHERAL VASCULAR BALLOON  ANGIOPLASTY Right 05/22/2020   Procedure: PERIPHERAL VASCULAR BALLOON ANGIOPLASTY;  Surgeon: Cherre Robins, MD;  Location: [facility];  Service: Cardiovascular;  Laterality: Right;  arm fistula   PERIPHERAL VASCULAR BALLOON ANGIOPLASTY Right 07/05/2020   Procedure: PERIPHERAL VASCULAR BALLOON ANGIOPLASTY;  Surgeon: Elam Dutch, MD;  Location: [facility];  Service: Cardiovascular;  Laterality: Right;  arm fistula   PORT-A-CATH REMOVAL Left 11/22/2017   Procedure: REMOVAL PORT-A-CATH LEFT CHEST;  Surgeon: Elam Dutch, MD;  Location: [place];  Service: Vascular;  Laterality: Left;   RETINAL DETACHMENT SURGERY Right    SCLEROTHERAPY  07/23/2020   Procedure: SCLEROTHERAPY;  Surgeon: Rogene Houston, MD;  [facility];  Service: Endoscopy;;   UPPER EXTREMITY VENOGRAPHY N/A 08/22/2020   Procedure: LEFT UPPER & CENTRAL VENOGRAPHY;  Surgeon: Marty Heck, MD;  Location: [facility];  Service: Cardiovascular;  Laterality: N/A;    Physical exam:  Vitals:   09/05/20 1521  BP: (!) 164/77  Pulse: (!) 53  Resp: 20  Temp: 98 F (36.7 C)  SpO2: 97%  Weight: 148 lb (67.1 kg)  Height: 5' 3.5" (1.613 m)    Right upper extremity edema still present but muscle less tense than previous her right arm is still about 40 to 50% larger than the left arm  Data: Patient recently had a central venogram which did show patent central veins and axillary brachial veins on the left side.  She had bilateral ABIs performed today which showed calcified vessels that were noncompressible.  Toe pressure was greater than 100 bilaterally.  Assessment: Patient with some improvement of right arm swelling post ligation of her AV fistula.  Plan: Patient will follow up with [place] on an as-needed basis if she wishes to have a new permanent access at some point in the future.  Ruta Hinds, MD [facility] Office: [phone]

## 2020-09-06 DIAGNOSIS — N186 End stage renal disease: Secondary | ICD-10-CM | POA: Diagnosis not present

## 2020-09-06 DIAGNOSIS — Z992 Dependence on renal dialysis: Secondary | ICD-10-CM | POA: Diagnosis not present

## 2020-09-06 LAB — PROTEIN ELECTROPHORESIS, SERUM
A/G Ratio: 1.3 (ref 0.7–1.7)
Albumin ELP: 3.2 g/dL (ref 2.9–4.4)
Alpha-1-Globulin: 0.2 g/dL (ref 0.0–0.4)
Alpha-2-Globulin: 0.8 g/dL (ref 0.4–1.0)
Beta Globulin: 0.9 g/dL (ref 0.7–1.3)
Gamma Globulin: 0.5 g/dL (ref 0.4–1.8)
Globulin, Total: 2.4 g/dL (ref 2.2–3.9)
Total Protein ELP: 5.6 g/dL — ABNORMAL LOW (ref 6.0–8.5)

## 2020-09-06 LAB — IMMUNOFIXATION ELECTROPHORESIS
IgA: 209 mg/dL (ref 64–422)
IgG (Immunoglobin G), Serum: 628 mg/dL (ref 586–1602)
IgM (Immunoglobulin M), Srm: 9 mg/dL — ABNORMAL LOW (ref 26–217)
Total Protein ELP: 5.6 g/dL — ABNORMAL LOW (ref 6.0–8.5)

## 2020-09-09 ENCOUNTER — Non-Acute Institutional Stay (SKILLED_NURSING_FACILITY): Payer: Medicare PPO | Admitting: Adult Health

## 2020-09-09 DIAGNOSIS — K269 Duodenal ulcer, unspecified as acute or chronic, without hemorrhage or perforation: Secondary | ICD-10-CM | POA: Diagnosis not present

## 2020-09-09 DIAGNOSIS — K259 Gastric ulcer, unspecified as acute or chronic, without hemorrhage or perforation: Secondary | ICD-10-CM

## 2020-09-09 DIAGNOSIS — N186 End stage renal disease: Secondary | ICD-10-CM

## 2020-09-09 DIAGNOSIS — I120 Hypertensive chronic kidney disease with stage 5 chronic kidney disease or end stage renal disease: Secondary | ICD-10-CM

## 2020-09-09 DIAGNOSIS — E1122 Type 2 diabetes mellitus with diabetic chronic kidney disease: Secondary | ICD-10-CM

## 2020-09-09 DIAGNOSIS — Z992 Dependence on renal dialysis: Secondary | ICD-10-CM

## 2020-09-09 DIAGNOSIS — S72001S Fracture of unspecified part of neck of right femur, sequela: Secondary | ICD-10-CM

## 2020-09-09 DIAGNOSIS — K219 Gastro-esophageal reflux disease without esophagitis: Secondary | ICD-10-CM | POA: Diagnosis not present

## 2020-09-10 ENCOUNTER — Inpatient Hospital Stay (HOSPITAL_COMMUNITY): Payer: Medicare PPO | Attending: Hematology

## 2020-09-10 ENCOUNTER — Other Ambulatory Visit: Payer: Self-pay

## 2020-09-10 ENCOUNTER — Inpatient Hospital Stay (HOSPITAL_COMMUNITY): Payer: Medicare PPO

## 2020-09-10 ENCOUNTER — Encounter: Payer: Self-pay | Admitting: Adult Health

## 2020-09-10 VITALS — BP 162/72 | HR 58 | Temp 96.9°F | Resp 18

## 2020-09-10 DIAGNOSIS — Z5111 Encounter for antineoplastic chemotherapy: Secondary | ICD-10-CM | POA: Insufficient documentation

## 2020-09-10 DIAGNOSIS — C9000 Multiple myeloma not having achieved remission: Secondary | ICD-10-CM | POA: Diagnosis not present

## 2020-09-10 LAB — COMPREHENSIVE METABOLIC PANEL
ALT: 10 U/L (ref 0–44)
AST: 18 U/L (ref 15–41)
Albumin: 3.4 g/dL — ABNORMAL LOW (ref 3.5–5.0)
Alkaline Phosphatase: 130 U/L — ABNORMAL HIGH (ref 38–126)
Anion gap: 12 (ref 5–15)
BUN: 41 mg/dL — ABNORMAL HIGH (ref 8–23)
CO2: 26 mmol/L (ref 22–32)
Calcium: 8.9 mg/dL (ref 8.9–10.3)
Chloride: 97 mmol/L — ABNORMAL LOW (ref 98–111)
Creatinine, Ser: 6.35 mg/dL — ABNORMAL HIGH (ref 0.44–1.00)
GFR, Estimated: 6 mL/min — ABNORMAL LOW (ref >60)
Glucose, Bld: 154 mg/dL — ABNORMAL HIGH (ref 70–99)
Potassium: 3.6 mmol/L (ref 3.5–5.1)
Sodium: 135 mmol/L (ref 135–145)
Total Bilirubin: 0.4 mg/dL (ref 0.3–1.2)
Total Protein: 6.2 g/dL — ABNORMAL LOW (ref 6.5–8.1)

## 2020-09-10 LAB — CBC WITH DIFFERENTIAL/PLATELET
Abs Immature Granulocytes: 0.04 10*3/uL (ref 0.00–0.07)
Basophils Absolute: 0.1 10*3/uL (ref 0.0–0.1)
Basophils Relative: 1 %
Eosinophils Absolute: 0.2 10*3/uL (ref 0.0–0.5)
Eosinophils Relative: 3 %
HCT: 38.4 % (ref 36.0–46.0)
Hemoglobin: 11.6 g/dL — ABNORMAL LOW (ref 12.0–15.0)
Immature Granulocytes: 1 %
Lymphocytes Relative: 21 %
Lymphs Abs: 1.2 10*3/uL (ref 0.7–4.0)
MCH: 30.7 pg (ref 26.0–34.0)
MCHC: 30.2 g/dL (ref 30.0–36.0)
MCV: 101.6 fL — ABNORMAL HIGH (ref 80.0–100.0)
Monocytes Absolute: 0.8 10*3/uL (ref 0.1–1.0)
Monocytes Relative: 14 %
Neutro Abs: 3.6 10*3/uL (ref 1.7–7.7)
Neutrophils Relative %: 60 %
Platelets: 270 10*3/uL (ref 150–400)
RBC: 3.78 MIL/uL — ABNORMAL LOW (ref 3.87–5.11)
RDW: 18.1 % — ABNORMAL HIGH (ref 11.5–15.5)
WBC: 5.9 10*3/uL (ref 4.0–10.5)
nRBC: 0 % (ref 0.0–0.2)

## 2020-09-10 LAB — MAGNESIUM: Magnesium: 2.1 mg/dL (ref 1.7–2.4)

## 2020-09-10 LAB — LACTATE DEHYDROGENASE: LDH: 155 U/L (ref 98–192)

## 2020-09-10 MED ORDER — PROCHLORPERAZINE MALEATE 10 MG PO TABS
10.0000 mg | ORAL_TABLET | Freq: Once | ORAL | Status: AC
Start: 2020-09-10 — End: 2020-09-10
  Administered 2020-09-10: 10 mg via ORAL

## 2020-09-10 MED ORDER — DEXAMETHASONE 4 MG PO TABS
ORAL_TABLET | ORAL | Status: AC
  Filled 2020-09-10: qty 3

## 2020-09-10 MED ORDER — BORTEZOMIB CHEMO SQ INJECTION 3.5 MG (2.5MG/ML)
1.3000 mg/m2 | Freq: Once | INTRAMUSCULAR | Status: AC
  Administered 2020-09-10: 2.25 mg via SUBCUTANEOUS
  Filled 2020-09-10: qty 0.9

## 2020-09-10 MED ORDER — DEXAMETHASONE 4 MG PO TABS
10.0000 mg | ORAL_TABLET | Freq: Once | ORAL | Status: AC
Start: 2020-09-10 — End: 2020-09-10
  Administered 2020-09-10: 10 mg via ORAL

## 2020-09-10 MED ORDER — PROCHLORPERAZINE MALEATE 10 MG PO TABS
ORAL_TABLET | ORAL | Status: AC
  Filled 2020-09-10: qty 1

## 2020-09-10 NOTE — Progress Notes (Addendum)
Patient presents today for Velcade injection per providers order.  Vital signs within parameters for treatment.  Labs pending.  Patient has no new complaints since last visit.  Creatinine noted to be 6.35.  MD notified and message received from Dr. Lorenso Courier okay to proceed with Velcade.  Velcade injection given today per MD orders.  Stable during injection without adverse affects.  Vital signs stable.  No complaints at this time.  Discharge from clinic via wheelchair in stable condition.  Alert and oriented X 3.  Follow up with [facility] as scheduled.

## 2020-09-10 NOTE — Patient Instructions (Signed)
[facility]  Discharge Instructions: Thank you for choosing [facility] to provide your oncology and hematology care.  If you have a lab appointment with the [facility], please come in thru the Main Entrance and check in at the main information desk.  Wear comfortable clothing and clothing appropriate for easy access to any Portacath or PICC line.   We strive to give you quality time with your provider. You may need to reschedule your appointment if you arrive late (15 or more minutes).  Arriving late affects you and other patients whose appointments are after yours.  Also, if you miss three or more appointments without notifying the office, you may be dismissed from the clinic at the provider's discretion.      For prescription refill requests, have your pharmacy contact our office and allow 72 hours for refills to be completed.    Today you received the following chemotherapy and/or immunotherapy agents Velcade injection      To help prevent nausea and vomiting after your treatment, we encourage you to take your nausea medication as directed.  BELOW ARE SYMPTOMS THAT SHOULD BE REPORTED IMMEDIATELY: *FEVER GREATER THAN 100.4 F (38 C) OR HIGHER *CHILLS OR SWEATING *NAUSEA AND VOMITING THAT IS NOT CONTROLLED WITH YOUR NAUSEA MEDICATION *UNUSUAL SHORTNESS OF BREATH *UNUSUAL BRUISING OR BLEEDING *URINARY PROBLEMS (pain or burning when urinating, or frequent urination) *BOWEL PROBLEMS (unusual diarrhea, constipation, pain near the anus) TENDERNESS IN MOUTH AND THROAT WITH OR WITHOUT PRESENCE OF ULCERS (sore throat, sores in mouth, or a toothache) UNUSUAL RASH, SWELLING OR PAIN  UNUSUAL VAGINAL DISCHARGE OR ITCHING   Items with * indicate a potential emergency and should be followed up as soon as possible or go to the Emergency Department if any problems should occur.  Please show the CHEMOTHERAPY ALERT CARD or IMMUNOTHERAPY ALERT CARD at check-in to the  Emergency Department and triage nurse.  Should you have questions after your visit or need to cancel or reschedule your appointment, please contact [facility] [phone]  and follow the prompts.  Office hours are 8:00 a.m. to 4:30 p.m. Monday - Friday. Please note that voicemails left after 4:00 p.m. may not be returned until the following business day.  We are closed weekends and major holidays. You have access to a nurse at all times for urgent questions. Please call the main number to the clinic [phone] and follow the prompts.  For any non-urgent questions, you may also contact your provider using MyChart. We now offer e-Visits for anyone 71 and older to request care online for non-urgent symptoms. For details visit [email or web address].   Also download the MyChart app! Go to the app store, search "MyChart", open the app, select [facility], and log in with your MyChart username and password.  Due to Covid, a mask is required upon entering the hospital/clinic. If you do not have a mask, one will be given to you upon arrival. For doctor visits, patients may have 1 support person aged 64 or older with them. For treatment visits, patients cannot have anyone with them due to current Covid guidelines and our immunocompromised population.

## 2020-09-10 NOTE — Progress Notes (Signed)
Location:  [facility] Room Number: 148 Place of Service:  SNF (31)   CODE STATUS: dnr  Allergies  Allergen Reactions   Ace Inhibitors Other (See Comments) and Cough    Tongue swell , ie angioedema   Angiotensin Receptor Blockers     Angioedema with ACE-I   Penicillins Other (See Comments)    Unsure of reaction Has patient had a PCN reaction causing immediate rash, facial/tongue/throat swelling, SOB or lightheadedness with hypotension: Unknown Has patient had a PCN reaction causing severe rash involving mucus membranes or skin necrosis: Unknown Has patient had a PCN reaction that required hospitalization: No Has patient had a PCN reaction occurring within the last 10 years: Unknown If all of the above answers are "NO", then may proceed with Cephalosporin use.     Chief Complaint  Patient presents with   Medical Management of Chronic Issues       Multiple gastric ulcers: duodenal ulcer: upper GI bleed: esophageal reflux disease unspecified esophagitis: . Closed right hip fracture sequella: Chronic kidney disease with end stage renal disease on dialysis due to type 2 diabetes mellitus/dependence on dialysis:   Hypertension due to end stage renal disease (ESRD) due to type 2 diabetes mellitus    HPI:  She is a 85 year old long term resident of this facility being seen for the management of her chronic illnesses: Multiple gastric ulcers: duodenal ulcer: upper GI bleed: esophageal reflux disease unspecified esophagitis: . Closed right hip fracture sequella: Chronic kidney disease with end stage renal disease on dialysis due to type 2 diabetes mellitus/dependence on dialysis:   Hypertension due to end stage renal disease (ESRD) due to type 2 diabetes mellitus. There are no reports of uncontrolled pain. No reports of changes in appetite; no reports of anxiety or agitation.   Past Medical History:  Diagnosis Date   Anemia     chronic macrocytic anemia   Anxiety     Chronic kidney disease    Chronic renal disease, stage 4, severely decreased glomerular filtration rate (GFR) between 15-29 mL/min/1.73 square meter ([place]) 81/03/7508   Complication of anesthesia    delirious after Breast Surgery   Dementia ([place])    mild   Depression    Diabetes mellitus with ESRD (end-stage renal disease) ([place])    type II   Dysphagia    Dyspnea    with activity   GERD (gastroesophageal reflux disease)    Glaucoma    Hyperlipidemia    Hypertension    Pneumonia    Stage 1 infiltrating ductal carcinoma of right female breast ([place]) 08/21/2015   ER+ PR+ HER 2 neu + (3+) T1cN0     Past Surgical History:  Procedure Laterality Date   A/V FISTULAGRAM Right 07/05/2020   Procedure: A/V Fistulagram;  Surgeon: Elam Dutch, MD;  Location: [facility];  Service: Cardiovascular;  Laterality: Right;   AV FISTULA PLACEMENT Left 11/22/2017   Procedure: ARTERIOVENOUS (AV) FISTULA CREATION LEFT ARM;  Surgeon: Elam Dutch, MD;  Location: [facility] OR;  Service: Vascular;  Laterality: Left;   AV FISTULA PLACEMENT Right 04/04/2020   Procedure: RIGHT ARM ARTERIOVENOUS FISTULA CREATION;  Surgeon: Rosetta Posner, MD;  Location: [facility];  Service: Vascular;  Laterality: Right;   AV FISTULA PLACEMENT Right 08/20/2020   Procedure: ARTERIOVENOUS (AV) FISTULA LIGATION RIGHT ARM;  Surgeon: Elam Dutch, MD;  Location: [facility];  Service: Vascular;  Laterality: Right;   BIOPSY  08/07/2016  Procedure: BIOPSY;  Surgeon: Daneil Dolin, MD;  [facility];  Service: Endoscopy;;  gastric ulcer biopsy   COLONOSCOPY     ESOPHAGOGASTRODUODENOSCOPY N/A 08/07/2016   LA Grade A esophagitis s/p dilation, small hiatal hernia, multiple gastric ulcers and erosions, duodenal erosions s/p biopsy. Negative H.pylori    ESOPHAGOGASTRODUODENOSCOPY N/A 11/27/2016   normal esophagus, previously noted gastric ulcers completely healed, normal duodenum.    ESOPHAGOGASTRODUODENOSCOPY (EGD) WITH PROPOFOL  N/A 07/23/2020   Procedure: ESOPHAGOGASTRODUODENOSCOPY (EGD) WITH PROPOFOL;  Surgeon: Rogene Houston, MD;  [facility];  Service: Endoscopy;  Laterality: N/A;   FISTULA SUPERFICIALIZATION Left 02/14/2018   Procedure: FISTULA SUPERFICIALIZATION LEFT ARM;  Surgeon: Angelia Mould, MD;  Location: [facility] OR;  Service: Vascular;  Laterality: Left;   FRACTURE SURGERY Right    ankle   HOT HEMOSTASIS  07/23/2020   Procedure: HOT HEMOSTASIS (ARGON PLASMA COAGULATION/BICAP);  Surgeon: Rogene Houston, MD;  [facility];  Service: Endoscopy;;   INTRAMEDULLARY (IM) NAIL INTERTROCHANTERIC Right 07/12/2020   Procedure: INTRAMEDULLARY (IM) NAIL INTERTROCHANTRIC;  Surgeon: Mordecai Rasmussen, MD;  [facility];  Service: Orthopedics;  Laterality: Right;   MALONEY DILATION N/A 08/07/2016   Procedure: Venia Minks DILATION;  Surgeon: Daneil Dolin, MD;  [facility];  Service: Endoscopy;  Laterality: N/A;   MASTECTOMY, PARTIAL Right    multiple myeloma     PERIPHERAL VASCULAR BALLOON ANGIOPLASTY Left 07/13/2019   Procedure: PERIPHERAL VASCULAR BALLOON ANGIOPLASTY;  Surgeon: Marty Heck, MD;  Location: [facility];  Service: Cardiovascular;  Laterality: Left;  arm fistulogram   PERIPHERAL VASCULAR BALLOON ANGIOPLASTY Right 05/22/2020   Procedure: PERIPHERAL VASCULAR BALLOON ANGIOPLASTY;  Surgeon: Cherre Robins, MD;  Location: [facility];  Service: Cardiovascular;  Laterality: Right;  arm fistula   PERIPHERAL VASCULAR BALLOON ANGIOPLASTY Right 07/05/2020   Procedure: PERIPHERAL VASCULAR BALLOON ANGIOPLASTY;  Surgeon: Elam Dutch, MD;  Location: [facility];  Service: Cardiovascular;  Laterality: Right;  arm fistula   PORT-A-CATH REMOVAL Left 11/22/2017   Procedure: REMOVAL PORT-A-CATH LEFT CHEST;  Surgeon: Elam Dutch, MD;  Location: [facility];  Service: Vascular;  Laterality: Left;   RETINAL DETACHMENT SURGERY Right    SCLEROTHERAPY  07/23/2020    Procedure: SCLEROTHERAPY;  Surgeon: Rogene Houston, MD;  [facility];  Service: Endoscopy;;   UPPER EXTREMITY VENOGRAPHY N/A 08/22/2020   Procedure: LEFT UPPER & CENTRAL VENOGRAPHY;  Surgeon: Marty Heck, MD;  Location: [facility];  Service: Cardiovascular;  Laterality: N/A;    Social History   Socioeconomic History   Marital status: Single    Spouse name: Not on file   Number of children: Not on file   Years of education: Not on file   Highest education level: Not on file  Occupational History   Occupation: retired   Tobacco Use   Smoking status: Never   Smokeless tobacco: Never  Vaping Use   Vaping Use: Never used  Substance and Sexual Activity   Alcohol use: No    Alcohol/week: 0.0 standard drinks   Drug use: No   Sexual activity: Never  Other Topics Concern   Not on file  Social History Narrative   Long term resident of SNF    Social Determinants of Health   Financial Resource Strain: Low Risk    Difficulty of Paying Living Expenses: Not very hard  Food Insecurity: No Food Insecurity   Worried About  Running Out of Food in the Last Year: Never true   Ran Out of Food in the Last Year: Never true  Transportation Needs: No Transportation Needs   Lack of Transportation (Medical): No   Lack of Transportation (Non-Medical): No  Physical Activity: Inactive   Days of Exercise per Week: 0 days   Minutes of Exercise per Session: 0 min  Stress: No Stress Concern Present   Feeling of Stress : Not at all  Social Connections: Moderately Isolated   Frequency of Communication with Friends and Family: More than three times a week   Frequency of Social Gatherings with Friends and Family: Once a week   Attends Religious Services: More than 4 times per year   Active Member of [employer] or Organizations: No   Attends Archivist Meetings: Never   Marital Status: Never married  Human resources officer Violence: Not At Risk   Fear of Current or  Ex-Partner: No   Emotionally Abused: No   Physically Abused: No   Sexually Abused: No   Family History  Problem Relation Age of Onset   Multiple myeloma Sister    Brain cancer Sister    Dementia Mother        died at 99   Stroke Mother    Heart failure Mother    Diabetes Mother    Heart disease Father    Prostate cancer Brother    Colon cancer Neg Hx       VITAL SIGNS BP 135/62   Pulse 63   Temp (!) 97.3 F (36.3 C)   Resp 18   Ht '5\' 3"'  (1.6 m)   Wt 143 lb 3.2 oz (65 kg)   BMI 25.37 kg/m   Outpatient Encounter Medications as of 09/09/2020  Medication Sig   acetaminophen (TYLENOL) 650 MG CR tablet Take 650 mg by mouth every 6 (six) hours. (0000, 0600, 1200 & 1800)   acyclovir (ZOVIRAX) 200 MG capsule Take 200 mg by mouth in the morning. (0800)   Amino Acids-Protein Hydrolys (FEEDING SUPPLEMENT, PRO-STAT SUGAR FREE 64,) LIQD Take 30 mLs by mouth 3 (three) times daily with meals. (0800, 1200 & 1800)   amLODipine (NORVASC) 10 MG tablet    anastrozole (ARIMIDEX) 1 MG tablet Take 1 tablet (1 mg total) by mouth in the morning. (0800)   atenolol (TENORMIN) 25 MG tablet Take 1 tablet (25 mg total) by mouth in the morning. (0800)   atorvastatin (LIPITOR) 10 MG tablet Take 10 mg by mouth at bedtime. (2000)   Balsam [place]-Castor Oil (VENELEX) OINT Apply topically. Apply to bilateral buttocks and sacrum every shift.   Calcium Acetate 667 MG TABS Take 667-1,334 mg by mouth See admin instructions. Take 2 tablets (1334 mg) by mouth with each meal (0800 &1730) and take 1 tablet (667 mg) by mouth with each snack (2000)   calcium-vitamin D (OSCAL WITH D) 500-200 MG-UNIT tablet Take 1 tablet by mouth in the morning. (0800)   cloNIDine (CATAPRES) 0.1 MG tablet Take 1 tablet (0.1 mg total) by mouth at bedtime. (2000) Hold for SBP < 110 bpm   insulin glargine (LANTUS SOLOSTAR) 100 UNIT/ML Solostar Pen Inject 10 Units into the skin at bedtime. (2000)   insulin lispro (HUMALOG) 100 UNIT/ML  injection Inject 5 Units into the skin 3 (three) times daily with meals. (0800, 1200 & 1800) Give for CBG>150.   insulin NPH-regular Human (NOVOLIN 70/30) (70-30) 100 UNIT/ML injection Inject into the skin.   Insulin Pen Needle (BD AUTOSHIELD DUO)  30G X 5 MM MISC    melatonin 3 MG TABS tablet Take 6 mg by mouth at bedtime. (2000) For Sleep   multivitamin (RENA-VIT) TABS tablet Take 1 tablet by mouth at bedtime. (2100)   NON FORMULARY Diet - renal carb modified 1200cc fluid restriction  Special Instructions: 1200cc fluid restriction. Dietary to provide 420cc/24 hours, 7-3= 360cc/24hrs, 3-11=320cc/24hrs, 11-7=100cc/24hrs.   Nutritional Supplements (FEEDING SUPPLEMENT, NEPRO CARB STEADY,) LIQD Take 237 mLs by mouth at bedtime. (2000)   ondansetron (ZOFRAN) 4 MG tablet Take 1 tablet (4 mg total) by mouth every 6 (six) hours as needed for nausea.   oxyCODONE-acetaminophen (PERCOCET) 5-325 MG tablet Take 1 tablet by mouth every 6 (six) hours as needed for severe pain.   pantoprazole (PROTONIX) 40 MG tablet Take 1 tablet (40 mg total) by mouth 2 (two) times daily before a meal. (0600 & 1700)   sertraline (ZOLOFT) 50 MG tablet Take 50 mg by mouth in the morning. (0800)   sucralfate (CARAFATE) 1 GM/10ML suspension Take 1 g by mouth in the morning, at noon, in the evening, and at bedtime. (0800, 1200, 1700 & 2100)   No facility-administered encounter medications on file as of 09/09/2020.     SIGNIFICANT DIAGNOSTIC EXAMS   PREVIOUS   07-11-20: chest x-ray:  Central catheter tip at cavoatrial junction. No pneumothorax. Mild left base atelectasis. No edema or airspace opacity. Stable cardiac silhouette. Aortic Atherosclerosis   07-11-20: right knee x-ray:  No acute fracture or dislocation is noted. Diffuse vascular calcifications are seen. No soft tissue abnormality is noted.  07-11-20: right hip x-ray:  Acute, impacted, angulated right basicervical femoral neck fracture.   TODAY  07-23-20: upper  endoscopy:  Normal hypopharynx. - Normal esophagus. - Z-line irregular, 34 cm from the incisors. - Non-bleeding gastric ulcers with no stigmata of bleeding. - Duodenal ulcer with stigmata of bleed/ pigmented material. Gold probe therapy was not successful and hemostasis achieved with injection of dilute epinephrine. - Non-bleeding duodenal ulcer with no stigmata of bleeding(second bulbar ulcer) - Normal second portion of the duodenum. - No specimens collected.  07-23-20: chest x-ray:No active disease. Aortic Atherosclerosis      LABS REVIEWED PREVIOUS  10-13-19: hgb a1c 6.0; vit B 12: 1126 folate 20.9 10-17-19: wbc 8.8; hgb 8.3; hct 26.1; mcv 114.0 plt 270; glucose 190; bun 28; creat 4.85; k+ 3.9; na++ 127; ca 8.9 liver normal albumin 4.0  10-31-19: wbc 6.0; hgb 8.8; hct 28.6; mcv 117.2 plt 326; glucose 188; bun 45; creat 6.28; k+ 3.9; na++ 137;ca 9.1 liver normal albumin 3.9 11-07-19: wbc 6.1; hgb 9.2; hct 29.4 mcv 115.7 plt 276; glucose 201; bun 44; creat 5.93; k+ 4.5; na++ 136; ca 9.1 liver normal albumin 3.8 11-14-19; wbc 8.3; hgb 9.7; hct 31.2 mcv 116.4 plt 261; glucose 198; bun 44; creat 6.66; k+ 5.1; na++ 138; ca 8.8 liver normal albumin 3.9   12-12-19: wbc 7.6; hgb 11.3; hct 35.6; mcv 109.9 plt 212; glucose 602; bun 67; creat 8.62; k+ 5.6; na++ 135; ca 7.9 liver normal albumin 3.5  12-20-19: hgb a1c 8.0  01-09-20: wbc 6.9; hgb 12.1; hct 39.7; mcv 105.6 plt 299; glucose 225; bun 112; creat 14.53; k+ 6.4; na++ 132; ca 8.3 liver normal albumin 3.5  01-23-20: k+ 6.6  01-23-20: wbc 7.9; hgb 11.6; hct 36.9; mcv 103.7 plt 359; glucose 139; bun 109; creat 15.46; k+ 6.1; na++ 140; ca 8.7 liver normal albumin 4.1;  01-24-20: k+ 4.4  01-30-20: wbc 10.6; hgb 9.7; hct 32.0;  mcv 103.6 plt 390; glucose 164; bun 44; creat 9.08; k+ 4.1; na++ 139; ca 9.0 liver normal albumin 3.3 02-06-20: wbc 7.8; hgb 9.9; hct 32.7 mcv 104.8 plt 344; glucose 211; bun 90; creat 10.97; k+ 6.0 na++ 137; ca 8.8 liver normal  albumin 3.4  03-26-20: wbc 6.3; hgb 9.4; hct 30.4; mcv 104.1; plt 300; glucose 223; bun 101; creat 14.22; k+ 5.8; na++ 135; ca 8.1 GFR 2; liver normal albumin 3.4  05-21-20; wbc 5.1; hgb 10.4; hct 32.8; mcv 106.8 plt 303; glucose 186; bun 41; creat 1.54; k+ 5.0; na++ 132; ca 8.4 GFR 5; liver normal albumin 3.7 06-13-20: chol 189; ldl 106; trig 211; hdl 41; hgb a1c 6.2   07-11-20: wbc 9.1; hgb 10.6; hct 34.6; mcv 109.1 plt 248; glucose 79; bun 45; creat 7.71 k+ 4.9; na++ 134; ca 8.1 GFR 5; liver normal albumin 3.5 mag 2.4 07-13-20: wbc 11.4; hgb 8.9; hct 29.7; mcv 108.8 plt 303; glucose 178; bun 66; creat 11.17; k+ 6.6; na++ 135; ca 8.2; GFR 3 07-15-20: wbc 11.0; hgb 7.5; hct 25.2; mcv 108.2 plt 251; glucose 117; bun 60; creat 9.41; k+ 5.5; na++ 138; ca 8.4 GFR 4 07-17-20: wbc 11.5; hgb 9.4; hct 29.8; mcv 100.3 plt 300; glucose 140; bun 68; creat 8.04; k+ 4.2; na++ 135; ca 8.5 GFR 5 07-22-20: wbc 11.6; hgb 7.1; hct 23.5; mcv 104.0 plt 444; glucose 214; bun 97; creat 9.18; k+ 4.5; na++ 140; ca 8.0; GFR4 07-24-20: wbc 14.3; hgb 6.7; hct 21.2; mcv 99.5 plt 385; glucose 96; bun 49; creat 6.55; k+ 4.7; na++ 133; ca 8.0; GFR4; mag 2.0 phos 3.8 07-26-20: wbc 10.4; hgb 8.5; hct 26.8; mcv 98.9 plt 430; glucose 111; bun 40;creat 6.90; k+ 4.2; na++ 133; ca 8.5; GFR 6; mag 2.2; phos 3.3    07-30-20: wbc 7.6; hgb 7.3; hct 24.1; mcv 103.4 plt 345; glucose 97; bun 26; creat 5.89; k+ 4.1; na++ 139; ca 8.6; GFR 7; liver normal albumin 2.8.   NO NEW LABS.   Review of Systems  Constitutional:  Negative for malaise/fatigue.  Respiratory:  Negative for cough and shortness of breath.   Cardiovascular:  Negative for chest pain, palpitations and leg swelling.  Gastrointestinal:  Negative for abdominal pain, constipation and heartburn.  Musculoskeletal:  Negative for back pain, joint pain and myalgias.  Skin: Negative.   Neurological:  Negative for dizziness.  Psychiatric/Behavioral:  The patient is not nervous/anxious.      Physical Exam Constitutional:      General: She is not in acute distress.    Appearance: She is well-developed. She is not diaphoretic.  Neck:     Thyroid: No thyromegaly.  Cardiovascular:     Rate and Rhythm: Normal rate and regular rhythm.     Heart sounds: Normal heart sounds.  Pulmonary:     Effort: Pulmonary effort is normal. No respiratory distress.     Breath sounds: Normal breath sounds.  Abdominal:     General: Bowel sounds are normal. There is no distension.     Palpations: Abdomen is soft.     Tenderness: There is no abdominal tenderness.  Musculoskeletal:     Cervical back: Neck supple.     Right lower leg: No edema.     Left lower leg: No edema.     Comments: Left neck dialysis access Right arm ace wrap on     Right hip IM nail 07-12-20     Lymphadenopathy:     Cervical: No cervical adenopathy.  Skin:    General: Skin is warm and dry.  Neurological:     Mental Status: She is alert. Mental status is at baseline.  Psychiatric:        Mood and Affect: Mood normal.      ASSESSMENT/ PLAN:   TODAY  Multiple gastric ulcers: duodenal ulcer: upper GI bleed: esophageal reflux disease unspecified esophagitis: is stable will continue protonix 40 mg twice daily and carafate 1 gm four times daily   2. Closed right hip fracture sequella: is stable is status post right hip nail 07-12-20: will follow up with orthopedics as indicated will continue tylenol 650 mg every 6 hours.   3. Chronic kidney disease with end stage renal disease on dialysis due to type 2 diabetes mellitus/dependence on dialysis: is stable on dialysis 3 days weekly; is on 1200 cc fluid restriction; will continue phohyra 677 mg 2 tabs twice daily with meals; and 1 with snacks  4. Hypertension due to end stage renal disease (ESRD) due to type 2 diabetes mellitus: is stable b/p 135/62; will continue tenormin 25 mg daily clonidine 0.1 mg daily   PREVIOUS   5. Hyperlipidemia associated with type 2  diabetes mellitus is stable LDL 106 will continue lipitor 10 mg daily   6. Diabetes mellitus with end stage renal disease (ESRD) is stable hgb a1c 6.2 will continue basaglar 10 units nightly will change novolog to 5 units with meals for cbg >150.   7. Aortic atherosclerosis/atherosclerotic peripheral vascular disease: is stable (scan 10-04-18) will monitor   8. Vascular dementia with behavioral disturbance: is without change weight is 142 pounds  9. Anemia due to end stage renal disease/acute blood loss anemia: is currently stable hgb 8.5 did receive transfusion while in the hospital  10. Stage 1 infiltrating ductal carcinoma of female right breast/status post mastectomy right: will continue rimidex 1 mg daily   11. Multiple myeloma no having achieved remission: is followed by oncology  12. Herpes: without recent outbreak will continue acyclovir 200 mg daily   13. Major depression chronic recurrent: is stable will continue zoloft 100 mg daily       Ok Edwards NP [facility]  Contact [phone] Monday through Friday 8am- 5pm  After hours call [phone]

## 2020-09-11 DIAGNOSIS — N186 End stage renal disease: Secondary | ICD-10-CM | POA: Diagnosis not present

## 2020-09-11 DIAGNOSIS — Z992 Dependence on renal dialysis: Secondary | ICD-10-CM | POA: Diagnosis not present

## 2020-09-11 DIAGNOSIS — C9000 Multiple myeloma not having achieved remission: Secondary | ICD-10-CM | POA: Diagnosis not present

## 2020-09-11 DIAGNOSIS — Z1159 Encounter for screening for other viral diseases: Secondary | ICD-10-CM | POA: Diagnosis not present

## 2020-09-11 LAB — PROTEIN ELECTROPHORESIS, SERUM
A/G Ratio: 1.4 (ref 0.7–1.7)
Albumin ELP: 3.4 g/dL (ref 2.9–4.4)
Alpha-1-Globulin: 0.2 g/dL (ref 0.0–0.4)
Alpha-2-Globulin: 0.8 g/dL (ref 0.4–1.0)
Beta Globulin: 0.8 g/dL (ref 0.7–1.3)
Gamma Globulin: 0.5 g/dL (ref 0.4–1.8)
Globulin, Total: 2.4 g/dL (ref 2.2–3.9)
Total Protein ELP: 5.8 g/dL — ABNORMAL LOW (ref 6.0–8.5)

## 2020-09-11 LAB — KAPPA/LAMBDA LIGHT CHAINS
Kappa free light chain: 125.3 mg/L — ABNORMAL HIGH (ref 3.3–19.4)
Kappa, lambda light chain ratio: 0.55 (ref 0.26–1.65)
Lambda free light chains: 229.2 mg/L — ABNORMAL HIGH (ref 5.7–26.3)

## 2020-09-12 DIAGNOSIS — Z1159 Encounter for screening for other viral diseases: Secondary | ICD-10-CM | POA: Diagnosis not present

## 2020-09-12 DIAGNOSIS — C9000 Multiple myeloma not having achieved remission: Secondary | ICD-10-CM | POA: Diagnosis not present

## 2020-09-13 DIAGNOSIS — N186 End stage renal disease: Secondary | ICD-10-CM | POA: Diagnosis not present

## 2020-09-13 DIAGNOSIS — Z992 Dependence on renal dialysis: Secondary | ICD-10-CM | POA: Diagnosis not present

## 2020-09-15 NOTE — Progress Notes (Deleted)
Referring Provider: Gerlene Fee, NP Primary Care Physician:  Gerlene Fee, NP Primary GI Physician: Dr. Gala Romney  No chief complaint on file.   HPI:   Carly Wood is a 85 y.o. female with history of ESRD on hemodialysis, multiple myeloma, right breast cancer in 2017, chronic anemia, DM, HTN, dementia, prior peptic ulcer disease presenting today for hospital follow-up.  She was hospitalized 5/16 - 07/26/2020 after presenting with melena, coffee-ground emesis, and found to have acute on chronic anemia with hemoglobin 7.1, down from 9.4 several days prior at discharge from prior hospitalization for hip fracture where she was discharged on 81 mg aspirin twice daily. EGD May 17 with 2 nonbleeding cratered gastric ulcers with no stigmata of bleeding in the prepyloric region of the stomach.  1 nonbleeding cratered duodenal ulcer with stigmata of bleeding/pigmented material found in the duodenal bulb.  Coagulation for bleeding prevention using gold probe unsuccessful, successfully injected with epinephrine for hemostasis.  An additional nonbleeding cratered duodenal ulcer seen.  She received 2 units PRBCs and completed 72-hour PPI infusion which was transitioned to PPI twice daily.  She was also started on Carafate 4 times daily x1 month.  Hemoglobin day of discharge was 8.5.  Recommended repeat EGD in 8-12 weeks to document ulcer healing.  Most recent hemoglobin 11.6 on 09/10/20.   Today:    Past Medical History:  Diagnosis Date   Anemia     chronic macrocytic anemia   Anxiety    Chronic kidney disease    Chronic renal disease, stage 4, severely decreased glomerular filtration rate (GFR) between 15-29 mL/min/1.73 square meter ([place]) 78/46/9629   Complication of anesthesia    delirious after Breast Surgery   Dementia ([place])    mild   Depression    Diabetes mellitus with ESRD (end-stage renal disease) ([place])    type II   Dysphagia    Dyspnea    with activity   GERD (gastroesophageal  reflux disease)    Glaucoma    Hyperlipidemia    Hypertension    Pneumonia    Stage 1 infiltrating ductal carcinoma of right female breast ([place]) 08/21/2015   ER+ PR+ HER 2 neu + (3+) T1cN0     Past Surgical History:  Procedure Laterality Date   A/V FISTULAGRAM Right 07/05/2020   Procedure: A/V Fistulagram;  Surgeon: Elam Dutch, MD;  Location: [facility];  Service: Cardiovascular;  Laterality: Right;   AV FISTULA PLACEMENT Left 11/22/2017   Procedure: ARTERIOVENOUS (AV) FISTULA CREATION LEFT ARM;  Surgeon: Elam Dutch, MD;  [facility];  Service: Vascular;  Laterality: Left;   AV FISTULA PLACEMENT Right 04/04/2020   Procedure: RIGHT ARM ARTERIOVENOUS FISTULA CREATION;  Surgeon: Rosetta Posner, MD;  Location: [facility];  Service: Vascular;  Laterality: Right;   AV FISTULA PLACEMENT Right 08/20/2020   Procedure: ARTERIOVENOUS (AV) FISTULA LIGATION RIGHT ARM;  Surgeon: Elam Dutch, MD;  Location: [facility] OR;  Service: Vascular;  Laterality: Right;   BIOPSY  08/07/2016   Procedure: BIOPSY;  Surgeon: Daneil Dolin, MD;  [facility];  Service: Endoscopy;;  gastric ulcer biopsy   COLONOSCOPY     ESOPHAGOGASTRODUODENOSCOPY N/A 08/07/2016   LA Grade A esophagitis s/p dilation, small hiatal hernia, multiple gastric ulcers and erosions, duodenal erosions s/p biopsy. Negative H.pylori    ESOPHAGOGASTRODUODENOSCOPY N/A 11/27/2016   normal esophagus, previously noted gastric ulcers completely healed, normal duodenum.    ESOPHAGOGASTRODUODENOSCOPY (EGD) WITH PROPOFOL N/A  07/23/2020   Procedure: ESOPHAGOGASTRODUODENOSCOPY (EGD) WITH PROPOFOL;  Surgeon: Rogene Houston, MD;  [facility];  Service: Endoscopy;  Laterality: N/A;   FISTULA SUPERFICIALIZATION Left 02/14/2018   Procedure: FISTULA SUPERFICIALIZATION LEFT ARM;  Surgeon: Angelia Mould, MD;  Location: [facility] OR;  Service: Vascular;  Laterality: Left;   FRACTURE SURGERY Right    ankle   HOT HEMOSTASIS   07/23/2020   Procedure: HOT HEMOSTASIS (ARGON PLASMA COAGULATION/BICAP);  Surgeon: Rogene Houston, MD;  [facility];  Service: Endoscopy;;   INTRAMEDULLARY (IM) NAIL INTERTROCHANTERIC Right 07/12/2020   Procedure: INTRAMEDULLARY (IM) NAIL INTERTROCHANTRIC;  Surgeon: Mordecai Rasmussen, MD;  Location: [facility];  Service: Orthopedics;  Laterality: Right;   MALONEY DILATION N/A 08/07/2016   Procedure: Venia Minks DILATION;  Surgeon: Daneil Dolin, MD;  [facility];  Service: Endoscopy;  Laterality: N/A;   MASTECTOMY, PARTIAL Right    multiple myeloma     PERIPHERAL VASCULAR BALLOON ANGIOPLASTY Left 07/13/2019   Procedure: PERIPHERAL VASCULAR BALLOON ANGIOPLASTY;  Surgeon: Marty Heck, MD;  Location: [facility];  Service: Cardiovascular;  Laterality: Left;  arm fistulogram   PERIPHERAL VASCULAR BALLOON ANGIOPLASTY Right 05/22/2020   Procedure: PERIPHERAL VASCULAR BALLOON ANGIOPLASTY;  Surgeon: Cherre Robins, MD;  Location: [facility];  Service: Cardiovascular;  Laterality: Right;  arm fistula   PERIPHERAL VASCULAR BALLOON ANGIOPLASTY Right 07/05/2020   Procedure: PERIPHERAL VASCULAR BALLOON ANGIOPLASTY;  Surgeon: Elam Dutch, MD;  Location: [facility] [facility];  Service: Cardiovascular;  Laterality: Right;  arm fistula   PORT-A-CATH REMOVAL Left 11/22/2017   Procedure: REMOVAL PORT-A-CATH LEFT CHEST;  Surgeon: Elam Dutch, MD;  Location: [place];  Service: Vascular;  Laterality: Left;   RETINAL DETACHMENT SURGERY Right    SCLEROTHERAPY  07/23/2020   Procedure: SCLEROTHERAPY;  Surgeon: Rogene Houston, MD;  [facility];  Service: Endoscopy;;   UPPER EXTREMITY VENOGRAPHY N/A 08/22/2020   Procedure: LEFT UPPER & CENTRAL VENOGRAPHY;  Surgeon: Marty Heck, MD;  Location: [facility];  Service: Cardiovascular;  Laterality: N/A;    No current outpatient medications on file.   No current facility-administered medications for this  visit.    Allergies as of 09/16/2020 - Review Complete 09/10/2020  Allergen Reaction Noted   Ace inhibitors Other (See Comments) and Cough 08/19/2015   Angiotensin receptor blockers  07/19/2020   Penicillins Other (See Comments) 08/19/2015    Family History  Problem Relation Age of Onset   Multiple myeloma Sister    Brain cancer Sister    Dementia Mother        died at 60   Stroke Mother    Heart failure Mother    Diabetes Mother    Heart disease Father    Prostate cancer Brother    Colon cancer Neg Hx     Social History   Socioeconomic History   Marital status: Single    Spouse name: Not on file   Number of children: Not on file   Years of education: Not on file   Highest education level: Not on file  Occupational History   Occupation: retired   Tobacco Use   Smoking status: Never   Smokeless tobacco: Never  Vaping Use   Vaping Use: Never used  Substance and Sexual Activity   Alcohol use: No    Alcohol/week: 0.0 standard drinks   Drug use: No   Sexual activity: Never  Other Topics Concern  Not on file  Social History Narrative   Long term resident of SNF    Social Determinants of Health   Financial Resource Strain: Low Risk    Difficulty of Paying Living Expenses: Not very hard  Food Insecurity: No Food Insecurity   Worried About Charity fundraiser in the Last Year: Never true   Ran Out of Food in the Last Year: Never true  Transportation Needs: No Transportation Needs   Lack of Transportation (Medical): No   Lack of Transportation (Non-Medical): No  Physical Activity: Inactive   Days of Exercise per Week: 0 days   Minutes of Exercise per Session: 0 min  Stress: No Stress Concern Present   Feeling of Stress : Not at all  Social Connections: Moderately Isolated   Frequency of Communication with Friends and Family: More than three times a week   Frequency of Social Gatherings with Friends and Family: Once a week   Attends Religious Services: More  than 4 times per year   Active Member of [employer] or Organizations: No   Attends Archivist Meetings: Never   Marital Status: Never married    Review of Systems: Gen: Denies fever, chills, anorexia. Denies fatigue, weakness, weight loss.  CV: Denies chest pain, palpitations, syncope, peripheral edema, and claudication. Resp: Denies dyspnea at rest, cough, wheezing, coughing up blood, and pleurisy. GI: Denies vomiting blood, jaundice, and fecal incontinence.   Denies dysphagia or odynophagia. Derm: Denies rash, itching, dry skin Psych: Denies depression, anxiety, memory loss, confusion. No homicidal or suicidal ideation.  Heme: Denies bruising, bleeding, and enlarged lymph nodes.  Physical Exam: There were no vitals taken for this visit. General:   Alert and oriented. No distress noted. Pleasant and cooperative.  Head:  Normocephalic and atraumatic. Eyes:  Conjuctiva clear without scleral icterus. Mouth:  Oral mucosa pink and moist. Good dentition. No lesions. Heart:  S1, S2 present without murmurs appreciated. Lungs:  Clear to auscultation bilaterally. No wheezes, rales, or rhonchi. No distress.  Abdomen:  +BS, soft, non-tender and non-distended. No rebound or guarding. No HSM or masses noted. Msk:  Symmetrical without gross deformities. Normal posture. Extremities:  Without edema. Neurologic:  Alert and  oriented x4 Psych:  Alert and cooperative. Normal mood and affect.

## 2020-09-16 ENCOUNTER — Ambulatory Visit: Payer: Medicare PPO | Admitting: Gastroenterology

## 2020-09-16 DIAGNOSIS — Z992 Dependence on renal dialysis: Secondary | ICD-10-CM | POA: Diagnosis not present

## 2020-09-16 DIAGNOSIS — N186 End stage renal disease: Secondary | ICD-10-CM | POA: Diagnosis not present

## 2020-09-17 DIAGNOSIS — C9000 Multiple myeloma not having achieved remission: Secondary | ICD-10-CM | POA: Diagnosis not present

## 2020-09-17 DIAGNOSIS — Z1159 Encounter for screening for other viral diseases: Secondary | ICD-10-CM | POA: Diagnosis not present

## 2020-09-18 DIAGNOSIS — N186 End stage renal disease: Secondary | ICD-10-CM | POA: Diagnosis not present

## 2020-09-18 DIAGNOSIS — Z992 Dependence on renal dialysis: Secondary | ICD-10-CM | POA: Diagnosis not present

## 2020-09-19 DIAGNOSIS — Z1159 Encounter for screening for other viral diseases: Secondary | ICD-10-CM | POA: Diagnosis not present

## 2020-09-19 DIAGNOSIS — C9000 Multiple myeloma not having achieved remission: Secondary | ICD-10-CM | POA: Diagnosis not present

## 2020-09-20 DIAGNOSIS — Z992 Dependence on renal dialysis: Secondary | ICD-10-CM | POA: Diagnosis not present

## 2020-09-20 DIAGNOSIS — N186 End stage renal disease: Secondary | ICD-10-CM | POA: Diagnosis not present

## 2020-09-23 DIAGNOSIS — Z992 Dependence on renal dialysis: Secondary | ICD-10-CM | POA: Diagnosis not present

## 2020-09-23 DIAGNOSIS — N186 End stage renal disease: Secondary | ICD-10-CM | POA: Diagnosis not present

## 2020-09-23 NOTE — Progress Notes (Signed)
Carly Wood, [place] [id number]   CLINIC:  Medical Oncology/Hematology  PCP:  Gerlene Fee, NP [address] [place] [address] [phone]   REASON FOR VISIT:  Follow-up for  plasma cell myeloma and anemia  PRIOR THERAPY: none  NGS Results: not done  CURRENT THERAPY: Velcade & Decadron 3/4 weeks  BRIEF ONCOLOGIC HISTORY:  Oncology History  Stage 1 infiltrating ductal carcinoma of right female breast ([place])  09/12/2014 Mammogram   Mass in upper outer R breast, middle third depth appears slightly larger than it was on prior exam with more irreg spiculated margins    10/02/2014 Pathology Results   biopsy with invasive ductal carcinoma high grade 1.1 cm, dcis solid type. additional R breast tissue, excision, invasive ductal high grade 0.9 cm     10/24/2014 Pathology Results   no residual invasive carcinoma, DCIS, focal, atypical ductal hyperplasia, 0/7 LN positive for metastatic carcinoma ER > 90%, PR 30%, HER 2 2+    10/24/2014 Cancer Staging   T1cN0M0    10/24/2014 Surgery   R mastectomy, T1c, N0    12/28/2014 - 11/22/2015 Chemotherapy   Taxol/herceptin weekly X 12, Herceptin every 21 days, last due in September 2017   03/11/2015 -  Anti-estrogen oral therapy   Arimidex 1 mg daily    09/12/2015 Imaging   MUGA- The left ventricular ejection fraction equals 68%.    06/08/2016 Treatment Plan Change   Started Nerlynx    Multiple myeloma (Carly Wood)  10/04/2018 Initial Diagnosis   Multiple myeloma (Carly Wood)    10/11/2018 -  Chemotherapy    Patient is on Treatment Plan: MYELOMA NON-TRANSPLANT CANDIDATES VRD WEEKLY D21D          CANCER STAGING: Cancer Staging Stage 1 infiltrating ductal carcinoma of right female breast [facility]) Staging form: Breast, AJCC 7th Edition - Clinical stage from 08/30/2015: Stage IA (T1c, N0, M0) - Signed by Baird Cancer, PA-C on 08/30/2015  Virtual Visit via Video Note  I connected with Carly Wood  Wood on _0 @ at  1:15 PM EDT by a video enabled telemedicine application and verified that I am speaking with the correct person using two identifiers.  Location: Patient: clinic Provider: home  Others present: Carly Rolls, RN  I discussed the limitations of evaluation and management by telemedicine and the availability of in person appointments. The patient expressed understanding and agreed to proceed.  INTERVAL HISTORY:  Carly Wood, a 85 y.o. female, returns for routine follow-up and consideration for next cycle of chemotherapy. [name] was last seen on 07/30/20.  Due for cycle #26 of VRd today.   Overall, she tells me she has been feeling pretty well. She reports stable tingling in her right hand and fingers starting 9-10 months ago. She reports a chronic cough and improved right knee pain. Her cough presents after drinking fluid and lasts for around 2 minutes. She has fallen 3 times since her last visit. She denies fatigue, and n/v.   Overall, she feels ready for next cycle of chemo today.   REVIEW OF SYSTEMS:  Review of Systems  Constitutional:  Negative for fatigue.  Respiratory:  Positive for cough.   Gastrointestinal:  Negative for nausea and vomiting.  Musculoskeletal:  Positive for arthralgias (R knee; improved).  Neurological:  Positive for numbness (R hand and fingers).   PAST MEDICAL/SURGICAL HISTORY:  Past Medical History:  Diagnosis Date   Anemia     chronic macrocytic anemia  Anxiety    Chronic kidney disease    Chronic renal disease, stage 4, severely decreased glomerular filtration rate (GFR) between 15-29 mL/min/1.73 square meter (HCC) 19/41/7408   Complication of anesthesia    delirious after Breast Surgery   Dementia ([place])    mild   Depression    Diabetes mellitus with ESRD (end-stage renal disease) ([place])    type II   Dysphagia    Dyspnea    with activity   GERD (gastroesophageal reflux disease)    Glaucoma    Hyperlipidemia     Hypertension    Pneumonia    Stage 1 infiltrating ductal carcinoma of right female breast ([place]) 08/21/2015   ER+ PR+ HER 2 neu + (3+) T1cN0    Past Surgical History:  Procedure Laterality Date   A/V FISTULAGRAM Right 07/05/2020   Procedure: A/V Fistulagram;  Surgeon: Elam Dutch, MD;  Location: [facility];  Service: Cardiovascular;  Laterality: Right;   AV FISTULA PLACEMENT Left 11/22/2017   Procedure: ARTERIOVENOUS (AV) FISTULA CREATION LEFT ARM;  Surgeon: Elam Dutch, MD;  Location: [facility];  Service: Vascular;  Laterality: Left;   AV FISTULA PLACEMENT Right 04/04/2020   Procedure: RIGHT ARM ARTERIOVENOUS FISTULA CREATION;  Surgeon: Rosetta Posner, MD;  Location[facility];  Service: Vascular;  Laterality: Right;   AV FISTULA PLACEMENT Right 08/20/2020   Procedure: ARTERIOVENOUS (AV) FISTULA LIGATION RIGHT ARM;  Surgeon: Elam Dutch, MD;  Location: [facility] OR;  Service: Vascular;  Laterality: Right;   BIOPSY  08/07/2016   Procedure: BIOPSY;  Surgeon: Daneil Dolin, MD;  [facility];  Service: Endoscopy;;  gastric ulcer biopsy   COLONOSCOPY     ESOPHAGOGASTRODUODENOSCOPY N/A 08/07/2016   LA Grade A esophagitis s/p dilation, small hiatal hernia, multiple gastric ulcers and erosions, duodenal erosions s/p biopsy. Negative H.pylori    ESOPHAGOGASTRODUODENOSCOPY N/A 11/27/2016   normal esophagus, previously noted gastric ulcers completely healed, normal duodenum.    ESOPHAGOGASTRODUODENOSCOPY (EGD) WITH PROPOFOL N/A 07/23/2020   Procedure: ESOPHAGOGASTRODUODENOSCOPY (EGD) WITH PROPOFOL;  Surgeon: Rogene Houston, MD;  [facility];  Service: Endoscopy;  Laterality: N/A;   FISTULA SUPERFICIALIZATION Left 02/14/2018   Procedure: FISTULA SUPERFICIALIZATION LEFT ARM;  Surgeon: Angelia Mould, MD;  Location[facility];  Service: Vascular;  Laterality: Left;   FRACTURE SURGERY Right    ankle   HOT HEMOSTASIS  07/23/2020   Procedure: HOT HEMOSTASIS (ARGON PLASMA  COAGULATION/BICAP);  Surgeon: Rogene Houston, MD;  [facility];  Service: Endoscopy;;   INTRAMEDULLARY (IM) NAIL INTERTROCHANTERIC Right 07/12/2020   Procedure: INTRAMEDULLARY (IM) NAIL INTERTROCHANTRIC;  Surgeon: Mordecai Rasmussen, MD;  Location: [facility];  Service: Orthopedics;  Laterality: Right;   MALONEY DILATION N/A 08/07/2016   Procedure: Venia Minks DILATION;  Surgeon: Daneil Dolin, MD;  [facility];  Service: Endoscopy;  Laterality: N/A;   MASTECTOMY, PARTIAL Right    multiple myeloma     PERIPHERAL VASCULAR BALLOON ANGIOPLASTY Left 07/13/2019   Procedure: PERIPHERAL VASCULAR BALLOON ANGIOPLASTY;  Surgeon: Marty Heck, MD;  Location: [place] [facility];  Service: Cardiovascular;  Laterality: Left;  arm fistulogram   PERIPHERAL VASCULAR BALLOON ANGIOPLASTY Right 05/22/2020   Procedure: PERIPHERAL VASCULAR BALLOON ANGIOPLASTY;  Surgeon: Cherre Robins, MD;  Location: [place] [facility];  Service: Cardiovascular;  Laterality: Right;  arm fistula   PERIPHERAL VASCULAR BALLOON ANGIOPLASTY Right 07/05/2020   Procedure: PERIPHERAL VASCULAR BALLOON ANGIOPLASTY;  Surgeon: Elam Dutch,  MD;  Location: [place] [facility];  Service: Cardiovascular;  Laterality: Right;  arm fistula   PORT-A-CATH REMOVAL Left 11/22/2017   Procedure: REMOVAL PORT-A-CATH LEFT CHEST;  Surgeon: Elam Dutch, MD;  Location: [place] [facility];  Service: Vascular;  Laterality: Left;   RETINAL DETACHMENT SURGERY Right    SCLEROTHERAPY  07/23/2020   Procedure: SCLEROTHERAPY;  Surgeon: Rogene Houston, MD;  [facility];  Service: Endoscopy;;   UPPER EXTREMITY VENOGRAPHY N/A 08/22/2020   Procedure: LEFT UPPER & CENTRAL VENOGRAPHY;  Surgeon: Marty Heck, MD;  Location: [place] [facility];  Service: Cardiovascular;  Laterality: N/A;    SOCIAL HISTORY:  Social History   Socioeconomic History   Marital status: Single    Spouse name: Not on file   Number of children: Not on file    Years of education: Not on file   Highest education level: Not on file  Occupational History   Occupation: retired   Tobacco Use   Smoking status: Never   Smokeless tobacco: Never  Vaping Use   Vaping Use: Never used  Substance and Sexual Activity   Alcohol use: No    Alcohol/week: 0.0 standard drinks   Drug use: No   Sexual activity: Never  Other Topics Concern   Not on file  Social History Narrative   Long term resident of SNF    Social Determinants of Health   Financial Resource Strain: Low Risk    Difficulty of Paying Living Expenses: Not very hard  Food Insecurity: No Food Insecurity   Worried About Charity fundraiser in the Last Year: Never true   [place] in the Last Year: Never true  Transportation Needs: No Transportation Needs   Lack of Transportation (Medical): No   Lack of Transportation (Non-Medical): No  Physical Activity: Inactive   Days of Exercise per Week: 0 days   Minutes of Exercise per Session: 0 min  Stress: No Stress Concern Present   Feeling of Stress : Not at all  Social Connections: Moderately Isolated   Frequency of Communication with Friends and Family: More than three times a week   Frequency of Social Gatherings with Friends and Family: Once a week   Attends Religious Services: More than 4 times per year   Active Member of [employer] or Organizations: No   Attends Music therapist: Never   Marital Status: Never married  Human resources officer Violence: Not At Risk   Fear of Current or Ex-Partner: No   Emotionally Abused: No   Physically Abused: No   Sexually Abused: No    FAMILY HISTORY:  Family History  Problem Relation Age of Onset   Multiple myeloma Sister    Brain cancer Sister    Dementia Mother        died at 1   Stroke Mother    Heart failure Mother    Diabetes Mother    Heart disease Father    Prostate cancer Brother    Colon cancer Neg Hx     CURRENT MEDICATIONS:  No current outpatient medications on  file.   No current facility-administered medications for this visit.    ALLERGIES:  Allergies  Allergen Reactions   Ace Inhibitors Other (See Comments) and Cough    Tongue swell , ie angioedema   Angiotensin Receptor Blockers     Angioedema with ACE-I   Penicillins Other (See Comments)    Unsure of reaction Has patient had a PCN reaction causing  immediate rash, facial/tongue/throat swelling, SOB or lightheadedness with hypotension: Unknown Has patient had a PCN reaction causing severe rash involving mucus membranes or skin necrosis: Unknown Has patient had a PCN reaction that required hospitalization: No Has patient had a PCN reaction occurring within the last 10 years: Unknown If all of the above answers are "NO", then may proceed with Cephalosporin use.     Penicillin G     Performance status (ECOG): 2 - Symptomatic, <50% confined to bed  Vitals:   09/24/20 1300  BP: (!) 175/88  Pulse: 65  Resp: 18  Temp: (!) 96.9 F (36.1 C)  SpO2: 98%   Wt Readings from Last 3 Encounters:  09/24/20 134 lb 6.4 oz (61 kg)  09/10/20 143 lb 3.2 oz (65 kg)  09/05/20 148 lb (67.1 kg)   LABORATORY DATA:  I have reviewed the labs as listed.  CBC Latest Ref Rng & Units 09/24/2020 09/10/2020 09/03/2020  WBC 4.0 - 10.5 K/uL 5.1 5.9 5.8  Hemoglobin 12.0 - 15.0 g/dL 12.4 11.6(L) 11.2(L)  Hematocrit 36.0 - 46.0 % 40.5 38.4 37.3  Platelets 150 - 400 K/uL 301 270 274   CMP Latest Ref Rng & Units 09/24/2020 09/10/2020 09/03/2020  Glucose 70 - 99 mg/dL 160(H) 154(H) 175(H)  BUN 8 - 23 mg/dL 42(H) 41(H) 38(H)  Creatinine 0.44 - 1.00 mg/dL 6.92(H) 6.35(H) 5.72(H)  Sodium 135 - 145 mmol/L 127(L) 135 138  Potassium 3.5 - 5.1 mmol/L 3.6 3.6 3.1(L)  Chloride 98 - 111 mmol/L 90(L) 97(L) 100  CO2 22 - 32 mmol/L _0 Calcium 8.9 - 10.3 mg/dL 8.9 8.9 9.2  Total Protein 6.5 - 8.1 g/dL 6.7 6.2(L) 6.0(L)  Total Bilirubin 0.3 - 1.2 mg/dL 0.5 0.4 0.4  Alkaline Phos 38 - 126 U/L 106 130(H) 138(H)  AST 15 -  41 U/L _1 ALT 0 - 44 U/L _2 DIAGNOSTIC IMAGING:  I have independently reviewed the scans and discussed with the patient. VAS [place] ABI WITH/WO TBI  Result Date: 09/05/2020  LOWER EXTREMITY DOPPLER STUDY Patient Name:  KATALEA UCCI  Date of Exam:   09/05/2020 Medical Rec #: [id number]       Accession #:    [id number] Date of Birth: [birth year]      Patient Gender: F Patient Age:   84Y Exam Location:  [facility] Procedure:      VAS [place] ABI WITH/WO TBI Referring Phys: [id number] [place] --------------------------------------------------------------------------------  Indications: Peripheral artery disease. High Risk Factors: Hypertension, hyperlipidemia, Diabetes.  Limitations: Today's exam was limited. Patient was scanned in wheelchair with              feet on the bed. Comparison Study: None Performing Technologist: Ivan Croft  Examination Guidelines: A complete evaluation includes at minimum, Doppler waveform signals and systolic blood pressure reading at the level of bilateral brachial, anterior tibial, and posterior tibial arteries, when vessel segments are accessible. Bilateral testing is considered an integral part of a complete examination. Photoelectric Plethysmograph (PPG) waveforms and toe systolic pressure readings are included as required and additional duplex testing as needed. Limited examinations for reoccurring indications may be performed as noted.  ABI Findings: +---------+------------------+-----+--------+--------+ Right    Rt Pressure (mmHg)IndexWaveformComment  +---------+------------------+-----+--------+--------+ Brachial 157                                     +---------+------------------+-----+--------+--------+  PTA      >230              [place]   biphasic         +---------+------------------+-----+--------+--------+ DP       >230              [place]   biphasic         +---------+------------------+-----+--------+--------+ Great  Toe122               0.78                  +---------+------------------+-----+--------+--------+ +---------+------------------+-----+--------+--------------+ Left     Lt Pressure (mmHg)IndexWaveformComment        +---------+------------------+-----+--------+--------------+ Brachial                                Restricted arm +---------+------------------+-----+--------+--------------+ PTA      >230              [place]   biphasic               +---------+------------------+-----+--------+--------------+ DP       >230              [place]   biphasic               +---------+------------------+-----+--------+--------------+ Great Toe101               0.64                        +---------+------------------+-----+--------+--------------+ +-------+-----------+-----------+------------+------------+ ABI/TBIToday's ABIToday's TBIPrevious ABIPrevious TBI +-------+-----------+-----------+------------+------------+ Right  [place]         0.78                                +-------+-----------+-----------+------------+------------+ Left   [place]         0.64                                +-------+-----------+-----------+------------+------------+ Arterial wall calcification precludes accurate ankle pressures and ABIs.  Summary: Right: Resting right ankle-brachial index indicates noncompressible right lower extremity arteries. The right toe-brachial index is normal. Left: Resting left ankle-brachial index indicates noncompressible left lower extremity arteries. The left toe-brachial index is abnormal.  *See table(s) above for measurements and observations.  Electronically signed by Ruta Hinds MD on 09/05/2020 at 3:59:27 PM.    Final      ASSESSMENT:  1.  IgA lambda plasma cell myeloma, stage II, standard risk: -BMBX on 09/20/2018 shows plasma cell myeloma, 30% plasma cells.  FISH panel was normal.  Chromosome analysis normal. -Labs at diagnosis on 08/29/2018 with M spike 0.9 g.   Kappa light chains 148, lambda light chains 1132, ratio 0.13.  LDH normal.  Beta-2 microglobulin 13.5.  She had transfusion dependent anemia. -Velcade and dexamethasone started on 10/11/2018. -Myeloma panel on 07/11/2019 shows SPEP is negative.  Free light chain ratio improved to 1.09.  Lambda light chains are 128, improved from 141.  Kappa light chains are 140. -She has been resident at [facility] since 10/12/2019.   2.  Stage I IDC of the right breast, ER/PR positive, HER-2 negative: -She is on anastrozole.   PLAN:  1.  IgA lambda plasma cell myeloma: -She reports that she is tolerating Velcade 3 weeks on 1 week  off with low-dose dexamethasone 10 mg on Velcade days very well. - She reported on and off numbness in the right hand and fingers for the last 9 months which has been stable. - She reportedly fell twice at the nursing home while coming back from the bathroom.  She has mild right knee pain which is improving. - I reviewed labs from 09/10/2020.  SPEP was negative.  Lambda light chains have improved to 229 with normal ratio of 0.55.  Immunofixation on 08/24/2020 was positive with IgG lambda protein. - I will recommend continuing Velcade 3 weeks on/1 week off along with low-dose dexamethasone as she is tolerating very well. - RTC 8 weeks with repeat myeloma labs.  If they remain stable to undetectable, will consider switching her to maintenance Velcade every other week.   2.  ESRD on HD: -Continue HD on Monday, Wednesday and Friday.   3.  Bone strengthening: -Bisphosphonates were not started due to patient being on dialysis.   4.  Osteopenia: - We will consider repeating DEXA scan in the future.  Last density of the bones in 2017 showed osteopenia.   5.  Right breast cancer: - Continue anastrozole.   Orders placed this encounter:  Orders Placed This Encounter  Procedures   CBC with Differential/Platelet   Comprehensive metabolic panel   Immunofixation electrophoresis   Kappa/lambda  light chains   Protein electrophoresis, serum   I provided 30 minutes of non-face-to-face time during this encounter.  Derek Jack, MD [place] [phone]   I, Thana Ates, am acting as a scribe for Dr. Derek Jack.  I, Derek Jack MD, have reviewed the above documentation for accuracy and completeness, and I agree with the above.

## 2020-09-24 ENCOUNTER — Inpatient Hospital Stay (HOSPITAL_COMMUNITY): Payer: Medicare PPO

## 2020-09-24 ENCOUNTER — Inpatient Hospital Stay (HOSPITAL_BASED_OUTPATIENT_CLINIC_OR_DEPARTMENT_OTHER): Payer: Medicare PPO | Admitting: Hematology

## 2020-09-24 VITALS — BP 175/88 | HR 65 | Temp 96.9°F | Resp 18 | Wt 134.4 lb

## 2020-09-24 DIAGNOSIS — C9000 Multiple myeloma not having achieved remission: Secondary | ICD-10-CM | POA: Diagnosis not present

## 2020-09-24 DIAGNOSIS — Z5111 Encounter for antineoplastic chemotherapy: Secondary | ICD-10-CM | POA: Diagnosis not present

## 2020-09-24 DIAGNOSIS — Z1159 Encounter for screening for other viral diseases: Secondary | ICD-10-CM | POA: Diagnosis not present

## 2020-09-24 DIAGNOSIS — N184 Chronic kidney disease, stage 4 (severe): Secondary | ICD-10-CM | POA: Diagnosis not present

## 2020-09-24 LAB — CBC WITH DIFFERENTIAL/PLATELET
Abs Immature Granulocytes: 0.02 K/uL (ref 0.00–0.07)
Basophils Absolute: 0.1 K/uL (ref 0.0–0.1)
Basophils Relative: 1 %
Eosinophils Absolute: 0.2 K/uL (ref 0.0–0.5)
Eosinophils Relative: 3 %
HCT: 40.5 % (ref 36.0–46.0)
Hemoglobin: 12.4 g/dL (ref 12.0–15.0)
Immature Granulocytes: 0 %
Lymphocytes Relative: 20 %
Lymphs Abs: 1 K/uL (ref 0.7–4.0)
MCH: 30.1 pg (ref 26.0–34.0)
MCHC: 30.6 g/dL (ref 30.0–36.0)
MCV: 98.3 fL (ref 80.0–100.0)
Monocytes Absolute: 0.7 K/uL (ref 0.1–1.0)
Monocytes Relative: 13 %
Neutro Abs: 3.1 K/uL (ref 1.7–7.7)
Neutrophils Relative %: 63 %
Platelets: 301 K/uL (ref 150–400)
RBC: 4.12 MIL/uL (ref 3.87–5.11)
RDW: 16.8 % — ABNORMAL HIGH (ref 11.5–15.5)
WBC: 5.1 K/uL (ref 4.0–10.5)
nRBC: 0 % (ref 0.0–0.2)

## 2020-09-24 LAB — COMPREHENSIVE METABOLIC PANEL
ALT: 10 U/L (ref 0–44)
AST: 16 U/L (ref 15–41)
Albumin: 3.7 g/dL (ref 3.5–5.0)
Alkaline Phosphatase: 106 U/L (ref 38–126)
Anion gap: 13 (ref 5–15)
BUN: 42 mg/dL — ABNORMAL HIGH (ref 8–23)
CO2: 24 mmol/L (ref 22–32)
Calcium: 8.9 mg/dL (ref 8.9–10.3)
Chloride: 90 mmol/L — ABNORMAL LOW (ref 98–111)
Creatinine, Ser: 6.92 mg/dL — ABNORMAL HIGH (ref 0.44–1.00)
GFR, Estimated: 6 mL/min — ABNORMAL LOW (ref >60)
Glucose, Bld: 160 mg/dL — ABNORMAL HIGH (ref 70–99)
Potassium: 3.6 mmol/L (ref 3.5–5.1)
Sodium: 127 mmol/L — ABNORMAL LOW (ref 135–145)
Total Bilirubin: 0.5 mg/dL (ref 0.3–1.2)
Total Protein: 6.7 g/dL (ref 6.5–8.1)

## 2020-09-24 LAB — MAGNESIUM: Magnesium: 2.2 mg/dL (ref 1.7–2.4)

## 2020-09-24 MED ORDER — DEXAMETHASONE 4 MG PO TABS
10.0000 mg | ORAL_TABLET | Freq: Once | ORAL | Status: AC
  Administered 2020-09-24: 10 mg via ORAL
  Filled 2020-09-24: qty 3

## 2020-09-24 MED ORDER — BORTEZOMIB CHEMO SQ INJECTION 3.5 MG (2.5MG/ML)
1.3000 mg/m2 | Freq: Once | INTRAMUSCULAR | Status: AC
  Administered 2020-09-24: 2.25 mg via SUBCUTANEOUS
  Filled 2020-09-24: qty 0.9

## 2020-09-24 MED ORDER — PROCHLORPERAZINE MALEATE 10 MG PO TABS
10.0000 mg | ORAL_TABLET | Freq: Once | ORAL | Status: AC
  Administered 2020-09-24: 10 mg via ORAL
  Filled 2020-09-24: qty 1

## 2020-09-24 NOTE — Progress Notes (Signed)
Labs reviewed today with Dr. Delton Coombes at Virtual office visit. Ok to treat per MD.   Treatment given per orders. Patient tolerated it well without problems. Vitals stable and discharged home from clinic via wheelchair. Follow up as scheduled.

## 2020-09-24 NOTE — Patient Instructions (Signed)
[place] at [facility] Discharge Instructions  You were seen today by Dr. Delton Coombes. He went over your recent results, and you received your treatment. Dr. Delton Coombes will see you back in 8 weeks for labs and follow up.   Thank you for choosing [place] at [facility] to provide your oncology and hematology care.  To afford each patient quality time with our provider, please arrive at least 15 minutes before your scheduled appointment time.   If you have a lab appointment with the [place] [place] please come in thru the Main Entrance and check in at the main information desk  You need to re-schedule your appointment should you arrive 10 or more minutes late.  We strive to give you quality time with our providers, and arriving late affects you and other patients whose appointments are after yours.  Also, if you no show three or more times for appointments you may be dismissed from the clinic at the providers discretion.     Again, thank you for choosing [facility].  Our hope is that these requests will decrease the amount of time that you wait before being seen by our physicians.       _____________________________________________________________  Should you have questions after your visit to [facility], please contact our office at [phone] between the hours of 8:00 a.m. and 4:30 p.m.  Voicemails left after 4:00 p.m. will not be returned until the following business day.  For prescription refill requests, have your pharmacy contact our office and allow 72 hours.    Cancer Center Support Programs:   > Cancer Support Group  2nd Tuesday of the month 1pm-2pm, Journey Room

## 2020-09-24 NOTE — Patient Instructions (Signed)
[facility]  Discharge Instructions: Thank you for choosing [facility] to provide your oncology and hematology care.  If you have a lab appointment with the Cancer Center, please come in thru the Main Entrance and check in at the main information desk.  Wear comfortable clothing and clothing appropriate for easy access to any Portacath or PICC line.   We strive to give you quality time with your provider. You may need to reschedule your appointment if you arrive late (15 or more minutes).  Arriving late affects you and other patients whose appointments are after yours.  Also, if you miss three or more appointments without notifying the office, you may be dismissed from the clinic at the provider's discretion.      For prescription refill requests, have your pharmacy contact our office and allow 72 hours for refills to be completed.        To help prevent nausea and vomiting after your treatment, we encourage you to take your nausea medication as directed.  BELOW ARE SYMPTOMS THAT SHOULD BE REPORTED IMMEDIATELY: *FEVER GREATER THAN 100.4 F (38 C) OR HIGHER *CHILLS OR SWEATING *NAUSEA AND VOMITING THAT IS NOT CONTROLLED WITH YOUR NAUSEA MEDICATION *UNUSUAL SHORTNESS OF BREATH *UNUSUAL BRUISING OR BLEEDING *URINARY PROBLEMS (pain or burning when urinating, or frequent urination) *BOWEL PROBLEMS (unusual diarrhea, constipation, pain near the anus) TENDERNESS IN MOUTH AND THROAT WITH OR WITHOUT PRESENCE OF ULCERS (sore throat, sores in mouth, or a toothache) UNUSUAL RASH, SWELLING OR PAIN  UNUSUAL VAGINAL DISCHARGE OR ITCHING   Items with * indicate a potential emergency and should be followed up as soon as possible or go to the Emergency Department if any problems should occur.  Please show the CHEMOTHERAPY ALERT CARD or IMMUNOTHERAPY ALERT CARD at check-in to the Emergency Department and triage nurse.  Should you have questions after your visit or need to cancel  or reschedule your appointment, please contact [facility] [phone]  and follow the prompts.  Office hours are 8:00 a.m. to 4:30 p.m. Monday - Friday. Please note that voicemails left after 4:00 p.m. may not be returned until the following business day.  We are closed weekends and major holidays. You have access to a nurse at all times for urgent questions. Please call the main number to the clinic [phone] and follow the prompts.  For any non-urgent questions, you may also contact your provider using MyChart. We now offer e-Visits for anyone 18 and older to request care online for non-urgent symptoms. For details visit [email or web address].   Also download the MyChart app! Go to the app store, search "MyChart", open the app, select [facility], and log in with your MyChart username and password.  Due to Covid, a mask is required upon entering the hospital/clinic. If you do not have a mask, one will be given to you upon arrival. For doctor visits, patients may have 1 support person aged 18 or older with them. For treatment visits, patients cannot have anyone with them due to current Covid guidelines and our immunocompromised population.  

## 2020-09-25 DIAGNOSIS — Z992 Dependence on renal dialysis: Secondary | ICD-10-CM | POA: Diagnosis not present

## 2020-09-25 DIAGNOSIS — N186 End stage renal disease: Secondary | ICD-10-CM | POA: Diagnosis not present

## 2020-09-25 MED ORDER — PEGFILGRASTIM-JMDB 6 MG/0.6ML ~~LOC~~ SOSY
PREFILLED_SYRINGE | SUBCUTANEOUS | Status: AC
  Filled 2020-09-25: qty 0.6

## 2020-09-25 MED ORDER — ZOLEDRONIC ACID 4 MG/100ML IV SOLN
INTRAVENOUS | Status: AC
  Filled 2020-09-25: qty 100

## 2020-09-25 MED ORDER — OCTREOTIDE ACETATE 30 MG IM KIT
PACK | INTRAMUSCULAR | Status: AC
  Filled 2020-09-25: qty 1

## 2020-09-26 DIAGNOSIS — Z1159 Encounter for screening for other viral diseases: Secondary | ICD-10-CM | POA: Diagnosis not present

## 2020-09-26 DIAGNOSIS — C9000 Multiple myeloma not having achieved remission: Secondary | ICD-10-CM | POA: Diagnosis not present

## 2020-09-26 MED ORDER — OCTREOTIDE ACETATE 30 MG IM KIT
PACK | INTRAMUSCULAR | Status: AC
  Filled 2020-09-26: qty 1

## 2020-09-27 DIAGNOSIS — Z992 Dependence on renal dialysis: Secondary | ICD-10-CM | POA: Diagnosis not present

## 2020-09-27 DIAGNOSIS — N186 End stage renal disease: Secondary | ICD-10-CM | POA: Diagnosis not present

## 2020-09-30 ENCOUNTER — Other Ambulatory Visit (HOSPITAL_COMMUNITY)
Admission: RE | Admit: 2020-09-30 | Discharge: 2020-09-30 | Disposition: A | Discharge status: Home / Self Care | Payer: Medicare PPO | Source: Ambulatory Visit | Attending: Hematology | Admitting: Hematology

## 2020-09-30 ENCOUNTER — Inpatient Hospital Stay (HOSPITAL_COMMUNITY): Payer: Medicare PPO

## 2020-09-30 ENCOUNTER — Other Ambulatory Visit (HOSPITAL_COMMUNITY): Payer: Medicare PPO

## 2020-09-30 DIAGNOSIS — C9000 Multiple myeloma not having achieved remission: Secondary | ICD-10-CM | POA: Insufficient documentation

## 2020-09-30 DIAGNOSIS — N186 End stage renal disease: Secondary | ICD-10-CM | POA: Diagnosis not present

## 2020-09-30 DIAGNOSIS — N184 Chronic kidney disease, stage 4 (severe): Secondary | ICD-10-CM | POA: Diagnosis not present

## 2020-09-30 DIAGNOSIS — Z992 Dependence on renal dialysis: Secondary | ICD-10-CM | POA: Diagnosis not present

## 2020-09-30 LAB — CBC WITH DIFFERENTIAL/PLATELET
Abs Immature Granulocytes: 0.02 10*3/uL (ref 0.00–0.07)
Abs Immature Granulocytes: 0.03 10*3/uL (ref 0.00–0.07)
Basophils Absolute: 0 10*3/uL (ref 0.0–0.1)
Basophils Absolute: 0.1 10*3/uL (ref 0.0–0.1)
Basophils Relative: 1 %
Basophils Relative: 1 %
Eosinophils Absolute: 0.2 10*3/uL (ref 0.0–0.5)
Eosinophils Absolute: 0.2 10*3/uL (ref 0.0–0.5)
Eosinophils Relative: 3 %
Eosinophils Relative: 4 %
HCT: 42.3 % (ref 36.0–46.0)
HCT: 42.6 % (ref 36.0–46.0)
Hemoglobin: 13.1 g/dL (ref 12.0–15.0)
Hemoglobin: 13.3 g/dL (ref 12.0–15.0)
Immature Granulocytes: 0 %
Immature Granulocytes: 1 %
Lymphocytes Relative: 22 %
Lymphocytes Relative: 23 %
Lymphs Abs: 1.3 10*3/uL (ref 0.7–4.0)
Lymphs Abs: 1.3 10*3/uL (ref 0.7–4.0)
MCH: 30.1 pg (ref 26.0–34.0)
MCH: 30.9 pg (ref 26.0–34.0)
MCHC: 30.8 g/dL (ref 30.0–36.0)
MCHC: 31.4 g/dL (ref 30.0–36.0)
MCV: 97.9 fL (ref 80.0–100.0)
MCV: 98.1 fL (ref 80.0–100.0)
Monocytes Absolute: 0.7 10*3/uL (ref 0.1–1.0)
Monocytes Absolute: 0.7 10*3/uL (ref 0.1–1.0)
Monocytes Relative: 12 %
Monocytes Relative: 13 %
Neutro Abs: 3.4 10*3/uL (ref 1.7–7.7)
Neutro Abs: 3.4 10*3/uL (ref 1.7–7.7)
Neutrophils Relative %: 59 %
Neutrophils Relative %: 61 %
Platelets: 252 10*3/uL (ref 150–400)
Platelets: 260 10*3/uL (ref 150–400)
RBC: 4.31 MIL/uL (ref 3.87–5.11)
RBC: 4.35 MIL/uL (ref 3.87–5.11)
RDW: 16.8 % — ABNORMAL HIGH (ref 11.5–15.5)
RDW: 16.9 % — ABNORMAL HIGH (ref 11.5–15.5)
WBC: 5.6 10*3/uL (ref 4.0–10.5)
WBC: 5.7 10*3/uL (ref 4.0–10.5)
nRBC: 0 % (ref 0.0–0.2)
nRBC: 0 % (ref 0.0–0.2)

## 2020-09-30 LAB — COMPREHENSIVE METABOLIC PANEL
ALT: 12 U/L (ref 0–44)
AST: 19 U/L (ref 15–41)
Albumin: 3.8 g/dL (ref 3.5–5.0)
Alkaline Phosphatase: 112 U/L (ref 38–126)
Anion gap: 11 (ref 5–15)
BUN: 23 mg/dL (ref 8–23)
CO2: 26 mmol/L (ref 22–32)
Calcium: 8.7 mg/dL — ABNORMAL LOW (ref 8.9–10.3)
Chloride: 97 mmol/L — ABNORMAL LOW (ref 98–111)
Creatinine, Ser: 4.18 mg/dL — ABNORMAL HIGH (ref 0.44–1.00)
GFR, Estimated: 10 mL/min — ABNORMAL LOW (ref >60)
Glucose, Bld: 92 mg/dL (ref 70–99)
Potassium: 3.1 mmol/L — ABNORMAL LOW (ref 3.5–5.1)
Sodium: 134 mmol/L — ABNORMAL LOW (ref 135–145)
Total Bilirubin: 0.4 mg/dL (ref 0.3–1.2)
Total Protein: 6.6 g/dL (ref 6.5–8.1)

## 2020-10-01 ENCOUNTER — Other Ambulatory Visit (HOSPITAL_COMMUNITY): Payer: Medicare PPO

## 2020-10-01 ENCOUNTER — Inpatient Hospital Stay (HOSPITAL_COMMUNITY): Payer: Medicare PPO

## 2020-10-01 VITALS — BP 170/67 | HR 67 | Temp 96.7°F | Resp 18

## 2020-10-01 DIAGNOSIS — C9000 Multiple myeloma not having achieved remission: Secondary | ICD-10-CM

## 2020-10-01 DIAGNOSIS — Z5111 Encounter for antineoplastic chemotherapy: Secondary | ICD-10-CM | POA: Diagnosis not present

## 2020-10-01 DIAGNOSIS — Z1159 Encounter for screening for other viral diseases: Secondary | ICD-10-CM | POA: Diagnosis not present

## 2020-10-01 MED ORDER — PROCHLORPERAZINE MALEATE 10 MG PO TABS
ORAL_TABLET | ORAL | Status: AC
  Filled 2020-10-01: qty 1

## 2020-10-01 MED ORDER — BORTEZOMIB CHEMO SQ INJECTION 3.5 MG (2.5MG/ML)
1.3000 mg/m2 | Freq: Once | INTRAMUSCULAR | Status: AC
  Administered 2020-10-01: 2.25 mg via SUBCUTANEOUS
  Filled 2020-10-01: qty 0.9

## 2020-10-01 MED ORDER — DEXAMETHASONE 4 MG PO TABS
10.0000 mg | ORAL_TABLET | Freq: Once | ORAL | Status: AC
  Administered 2020-10-01: 10 mg via ORAL

## 2020-10-01 MED ORDER — DEXAMETHASONE 4 MG PO TABS
ORAL_TABLET | ORAL | Status: AC
  Filled 2020-10-01: qty 3

## 2020-10-01 MED ORDER — PROCHLORPERAZINE MALEATE 10 MG PO TABS
10.0000 mg | ORAL_TABLET | Freq: Once | ORAL | Status: AC
  Administered 2020-10-01: 10 mg via ORAL

## 2020-10-01 NOTE — Progress Notes (Signed)
Patient presents today for Velcade injection. Creatinine 4.18 09/30/20. MD aware. Blood pressure on arrival elevated. Patient denies any dizziness, blurred vision, or headache. Patient has no complaints of any changes since her last treatment.   Treatment given today per MD orders. Tolerated without adverse affects. Vital signs stable. No complaints at this time. Discharged from clinic by wheelchair and taken down by tech from the [facility] in stable condition. Alert and oriented x 3. F/U with [facility] as scheduled.

## 2020-10-01 NOTE — Patient Instructions (Signed)
[place]  Discharge Instructions: Thank you for choosing [facility] to provide your oncology and hematology care.  If you have a lab appointment with the [facility], please come in thru the Main Entrance and check in at the main information desk.  Wear comfortable clothing and clothing appropriate for easy access to any Portacath or PICC line.   We strive to give you quality time with your provider. You may need to reschedule your appointment if you arrive late (15 or more minutes).  Arriving late affects you and other patients whose appointments are after yours.  Also, if you miss three or more appointments without notifying the office, you may be dismissed from the clinic at the provider's discretion.      For prescription refill requests, have your pharmacy contact our office and allow 72 hours for refills to be completed.    Today you received the following chemotherapy and/or immunotherapy agents Velcade.       To help prevent nausea and vomiting after your treatment, we encourage you to take your nausea medication as directed.  BELOW ARE SYMPTOMS THAT SHOULD BE REPORTED IMMEDIATELY: *FEVER GREATER THAN 100.4 F (38 C) OR HIGHER *CHILLS OR SWEATING *NAUSEA AND VOMITING THAT IS NOT CONTROLLED WITH YOUR NAUSEA MEDICATION *UNUSUAL SHORTNESS OF BREATH *UNUSUAL BRUISING OR BLEEDING *URINARY PROBLEMS (pain or burning when urinating, or frequent urination) *BOWEL PROBLEMS (unusual diarrhea, constipation, pain near the anus) TENDERNESS IN MOUTH AND THROAT WITH OR WITHOUT PRESENCE OF ULCERS (sore throat, sores in mouth, or a toothache) UNUSUAL RASH, SWELLING OR PAIN  UNUSUAL VAGINAL DISCHARGE OR ITCHING   Items with * indicate a potential emergency and should be followed up as soon as possible or go to the Emergency Department if any problems should occur.  Please show the CHEMOTHERAPY ALERT CARD or IMMUNOTHERAPY ALERT CARD at check-in to the Emergency  Department and triage nurse.  Should you have questions after your visit or need to cancel or reschedule your appointment, please contact [facility] [phone]  and follow the prompts.  Office hours are 8:00 a.m. to 4:30 p.m. Monday - Friday. Please note that voicemails left after 4:00 p.m. may not be returned until the following business day.  We are closed weekends and major holidays. You have access to a nurse at all times for urgent questions. Please call the main number to the clinic [phone] and follow the prompts.  For any non-urgent questions, you may also contact your provider using MyChart. We now offer e-Visits for anyone 64 and older to request care online for non-urgent symptoms. For details visit [email or web address].   Also download the MyChart app! Go to the app store, search "MyChart", open the app, select [facility], and log in with your MyChart username and password.  Due to Covid, a mask is required upon entering the hospital/clinic. If you do not have a mask, one will be given to you upon arrival. For doctor visits, patients may have 1 support person aged 75 or older with them. For treatment visits, patients cannot have anyone with them due to current Covid guidelines and our immunocompromised population.

## 2020-10-02 DIAGNOSIS — N186 End stage renal disease: Secondary | ICD-10-CM | POA: Diagnosis not present

## 2020-10-02 DIAGNOSIS — Z992 Dependence on renal dialysis: Secondary | ICD-10-CM | POA: Diagnosis not present

## 2020-10-02 LAB — KAPPA/LAMBDA LIGHT CHAINS
Kappa free light chain: 93.5 mg/L — ABNORMAL HIGH (ref 3.3–19.4)
Kappa, lambda light chain ratio: 0.46 (ref 0.26–1.65)
Lambda free light chains: 202.1 mg/L — ABNORMAL HIGH (ref 5.7–26.3)

## 2020-10-03 DIAGNOSIS — C9000 Multiple myeloma not having achieved remission: Secondary | ICD-10-CM | POA: Diagnosis not present

## 2020-10-03 DIAGNOSIS — Z1159 Encounter for screening for other viral diseases: Secondary | ICD-10-CM | POA: Diagnosis not present

## 2020-10-03 LAB — PROTEIN ELECTROPHORESIS, SERUM
A/G Ratio: 1.7 (ref 0.7–1.7)
Albumin ELP: 4 g/dL (ref 2.9–4.4)
Alpha-1-Globulin: 0.2 g/dL (ref 0.0–0.4)
Alpha-2-Globulin: 0.8 g/dL (ref 0.4–1.0)
Beta Globulin: 0.8 g/dL (ref 0.7–1.3)
Gamma Globulin: 0.5 g/dL (ref 0.4–1.8)
Globulin, Total: 2.3 g/dL (ref 2.2–3.9)
Total Protein ELP: 6.3 g/dL (ref 6.0–8.5)

## 2020-10-04 ENCOUNTER — Encounter: Payer: Medicare PPO | Admitting: Orthopedic Surgery

## 2020-10-04 DIAGNOSIS — N186 End stage renal disease: Secondary | ICD-10-CM | POA: Diagnosis not present

## 2020-10-04 DIAGNOSIS — Z992 Dependence on renal dialysis: Secondary | ICD-10-CM | POA: Diagnosis not present

## 2020-10-06 DIAGNOSIS — Z992 Dependence on renal dialysis: Secondary | ICD-10-CM | POA: Diagnosis not present

## 2020-10-06 DIAGNOSIS — N186 End stage renal disease: Secondary | ICD-10-CM | POA: Diagnosis not present

## 2020-10-07 ENCOUNTER — Inpatient Hospital Stay (HOSPITAL_COMMUNITY): Payer: Medicare PPO | Attending: Hematology

## 2020-10-07 ENCOUNTER — Other Ambulatory Visit: Payer: Self-pay

## 2020-10-07 DIAGNOSIS — L602 Onychogryphosis: Secondary | ICD-10-CM | POA: Diagnosis not present

## 2020-10-07 DIAGNOSIS — E1140 Type 2 diabetes mellitus with diabetic neuropathy, unspecified: Secondary | ICD-10-CM | POA: Diagnosis not present

## 2020-10-07 DIAGNOSIS — Z992 Dependence on renal dialysis: Secondary | ICD-10-CM | POA: Diagnosis not present

## 2020-10-07 DIAGNOSIS — N186 End stage renal disease: Secondary | ICD-10-CM | POA: Diagnosis not present

## 2020-10-07 DIAGNOSIS — Z5111 Encounter for antineoplastic chemotherapy: Secondary | ICD-10-CM | POA: Insufficient documentation

## 2020-10-07 DIAGNOSIS — C9000 Multiple myeloma not having achieved remission: Secondary | ICD-10-CM

## 2020-10-07 LAB — CBC WITH DIFFERENTIAL/PLATELET
Abs Immature Granulocytes: 0.03 10*3/uL (ref 0.00–0.07)
Basophils Absolute: 0.1 10*3/uL (ref 0.0–0.1)
Basophils Relative: 1 %
Eosinophils Absolute: 0.2 10*3/uL (ref 0.0–0.5)
Eosinophils Relative: 3 %
HCT: 41.4 % (ref 36.0–46.0)
Hemoglobin: 12.7 g/dL (ref 12.0–15.0)
Immature Granulocytes: 0 %
Lymphocytes Relative: 13 %
Lymphs Abs: 0.9 10*3/uL (ref 0.7–4.0)
MCH: 30 pg (ref 26.0–34.0)
MCHC: 30.7 g/dL (ref 30.0–36.0)
MCV: 97.6 fL (ref 80.0–100.0)
Monocytes Absolute: 0.8 10*3/uL (ref 0.1–1.0)
Monocytes Relative: 11 %
Neutro Abs: 4.9 10*3/uL (ref 1.7–7.7)
Neutrophils Relative %: 72 %
Platelets: 235 10*3/uL (ref 150–400)
RBC: 4.24 MIL/uL (ref 3.87–5.11)
RDW: 16.8 % — ABNORMAL HIGH (ref 11.5–15.5)
WBC: 6.9 10*3/uL (ref 4.0–10.5)
nRBC: 0 % (ref 0.0–0.2)

## 2020-10-07 LAB — IMMUNOFIXATION ELECTROPHORESIS
IgA: 223 mg/dL (ref 64–422)
IgG (Immunoglobin G), Serum: 684 mg/dL (ref 586–1602)
IgM (Immunoglobulin M), Srm: 6 mg/dL — ABNORMAL LOW (ref 26–217)
Total Protein ELP: 6.1 g/dL (ref 6.0–8.5)

## 2020-10-07 LAB — COMPREHENSIVE METABOLIC PANEL
ALT: 10 U/L (ref 0–44)
AST: 14 U/L — ABNORMAL LOW (ref 15–41)
Albumin: 3.6 g/dL (ref 3.5–5.0)
Alkaline Phosphatase: 98 U/L (ref 38–126)
Anion gap: 14 (ref 5–15)
BUN: 78 mg/dL — ABNORMAL HIGH (ref 8–23)
CO2: 22 mmol/L (ref 22–32)
Calcium: 8.9 mg/dL (ref 8.9–10.3)
Chloride: 97 mmol/L — ABNORMAL LOW (ref 98–111)
Creatinine, Ser: 9.29 mg/dL — ABNORMAL HIGH (ref 0.44–1.00)
GFR, Estimated: 4 mL/min — ABNORMAL LOW (ref >60)
Glucose, Bld: 108 mg/dL — ABNORMAL HIGH (ref 70–99)
Potassium: 3.8 mmol/L (ref 3.5–5.1)
Sodium: 133 mmol/L — ABNORMAL LOW (ref 135–145)
Total Bilirubin: 0.6 mg/dL (ref 0.3–1.2)
Total Protein: 6.3 g/dL — ABNORMAL LOW (ref 6.5–8.1)

## 2020-10-07 LAB — LACTATE DEHYDROGENASE: LDH: 136 U/L (ref 98–192)

## 2020-10-07 LAB — MAGNESIUM: Magnesium: 2.5 mg/dL — ABNORMAL HIGH (ref 1.7–2.4)

## 2020-10-08 ENCOUNTER — Encounter: Payer: Self-pay | Admitting: Orthopedic Surgery

## 2020-10-08 ENCOUNTER — Ambulatory Visit (INDEPENDENT_AMBULATORY_CARE_PROVIDER_SITE_OTHER): Payer: Medicare PPO | Admitting: Orthopedic Surgery

## 2020-10-08 ENCOUNTER — Inpatient Hospital Stay (HOSPITAL_COMMUNITY): Payer: Medicare PPO

## 2020-10-08 ENCOUNTER — Other Ambulatory Visit (HOSPITAL_COMMUNITY): Payer: Medicare PPO

## 2020-10-08 ENCOUNTER — Inpatient Hospital Stay: Payer: Medicare PPO

## 2020-10-08 ENCOUNTER — Encounter (HOSPITAL_COMMUNITY): Payer: Self-pay

## 2020-10-08 VITALS — Ht 63.0 in | Wt 134.0 lb

## 2020-10-08 VITALS — BP 148/74 | HR 61 | Temp 97.0°F | Resp 18

## 2020-10-08 DIAGNOSIS — S72001D Fracture of unspecified part of neck of right femur, subsequent encounter for closed fracture with routine healing: Secondary | ICD-10-CM

## 2020-10-08 DIAGNOSIS — Z5111 Encounter for antineoplastic chemotherapy: Secondary | ICD-10-CM | POA: Diagnosis not present

## 2020-10-08 DIAGNOSIS — C9000 Multiple myeloma not having achieved remission: Secondary | ICD-10-CM

## 2020-10-08 DIAGNOSIS — Z1159 Encounter for screening for other viral diseases: Secondary | ICD-10-CM | POA: Diagnosis not present

## 2020-10-08 LAB — KAPPA/LAMBDA LIGHT CHAINS
Kappa free light chain: 142.1 mg/L — ABNORMAL HIGH (ref 3.3–19.4)
Kappa, lambda light chain ratio: 0.64 (ref 0.26–1.65)
Lambda free light chains: 222.7 mg/L — ABNORMAL HIGH (ref 5.7–26.3)

## 2020-10-08 MED ORDER — PROCHLORPERAZINE MALEATE 10 MG PO TABS
10.0000 mg | ORAL_TABLET | Freq: Once | ORAL | Status: AC
  Administered 2020-10-08: 10 mg via ORAL
  Filled 2020-10-08: qty 1

## 2020-10-08 MED ORDER — LANREOTIDE ACETATE 120 MG/0.5ML ~~LOC~~ SOLN
SUBCUTANEOUS | Status: AC
  Filled 2020-10-08: qty 120

## 2020-10-08 MED ORDER — BORTEZOMIB CHEMO SQ INJECTION 3.5 MG (2.5MG/ML)
1.3000 mg/m2 | Freq: Once | INTRAMUSCULAR | Status: AC
  Administered 2020-10-08: 2.25 mg via SUBCUTANEOUS
  Filled 2020-10-08: qty 0.9

## 2020-10-08 MED ORDER — DEXAMETHASONE 4 MG PO TABS
10.0000 mg | ORAL_TABLET | Freq: Once | ORAL | Status: AC
  Administered 2020-10-08: 10 mg via ORAL
  Filled 2020-10-08: qty 3

## 2020-10-08 NOTE — Progress Notes (Signed)
Patient tolerated Velcade injection with no complaints voiced. Lab work reviewed. See MAR for details. Injection site clean and dry with no bruising or swelling noted. Patient stable during and after injection. Band aid applied. VSS. Patient left in satisfactory condition via wheelchair to [facility], with no s/s of distress noted.

## 2020-10-08 NOTE — Patient Instructions (Signed)
[place]  Discharge Instructions: Thank you for choosing [facility] to provide your oncology and hematology care.  If you have a lab appointment with the [facility], please come in thru the Main Entrance and check in at the main information desk.  Wear comfortable clothing and clothing appropriate for easy access to any Portacath or PICC line.   We strive to give you quality time with your provider. You may need to reschedule your appointment if you arrive late (15 or more minutes).  Arriving late affects you and other patients whose appointments are after yours.  Also, if you miss three or more appointments without notifying the office, you may be dismissed from the clinic at the provider's discretion.      For prescription refill requests, have your pharmacy contact our office and allow 72 hours for refills to be completed.    Today you received the following chemotherapy and/or immunotherapy agents: Velcade   To help prevent nausea and vomiting after your treatment, we encourage you to take your nausea medication as directed.  BELOW ARE SYMPTOMS THAT SHOULD BE REPORTED IMMEDIATELY: *FEVER GREATER THAN 100.4 F (38 C) OR HIGHER *CHILLS OR SWEATING *NAUSEA AND VOMITING THAT IS NOT CONTROLLED WITH YOUR NAUSEA MEDICATION *UNUSUAL SHORTNESS OF BREATH *UNUSUAL BRUISING OR BLEEDING *URINARY PROBLEMS (pain or burning when urinating, or frequent urination) *BOWEL PROBLEMS (unusual diarrhea, constipation, pain near the anus) TENDERNESS IN MOUTH AND THROAT WITH OR WITHOUT PRESENCE OF ULCERS (sore throat, sores in mouth, or a toothache) UNUSUAL RASH, SWELLING OR PAIN  UNUSUAL VAGINAL DISCHARGE OR ITCHING   Items with * indicate a potential emergency and should be followed up as soon as possible or go to the Emergency Department if any problems should occur.  Please show the CHEMOTHERAPY ALERT CARD or IMMUNOTHERAPY ALERT CARD at check-in to the Emergency Department  and triage nurse.  Should you have questions after your visit or need to cancel or reschedule your appointment, please contact [facility] [phone]  and follow the prompts.  Office hours are 8:00 a.m. to 4:30 p.m. Monday - Friday. Please note that voicemails left after 4:00 p.m. may not be returned until the following business day.  We are closed weekends and major holidays. You have access to a nurse at all times for urgent questions. Please call the main number to the clinic [phone] and follow the prompts.  For any non-urgent questions, you may also contact your provider using MyChart. We now offer e-Visits for anyone 41 and older to request care online for non-urgent symptoms. For details visit [email or web address].   Also download the MyChart app! Go to the app store, search "MyChart", open the app, select [facility], and log in with your MyChart username and password.  Due to Covid, a mask is required upon entering the hospital/clinic. If you do not have a mask, one will be given to you upon arrival. For doctor visits, patients may have 1 support person aged 45 or older with them. For treatment visits, patients cannot have anyone with them due to current Covid guidelines and our immunocompromised population.

## 2020-10-08 NOTE — Progress Notes (Signed)
Orthopaedic Postop Note  Assessment: Carly Wood is a 85 y.o. female s/p cephalomedullary nail for right intertrochanteric fracture   DOS: 07/12/2020  Plan: Repeat radiographs today demonstrate some additional migration, without obvious lag screw cutout.  She is not complaining of any pain in her groin.  She has some pain over the lateral hip, but nothing on the groin with movement or axial loading.  She has multiple comorbidities including kidney failure requiring dialysis, as well as multiple myeloma.  As result, she does not ambulate well at baseline.  She has not been working with therapy in the nursing facility which she lives.  All of this into consideration, I do not feel that another surgical intervention is warranted at this time.  Compared to the last radiographs obtained, alignment does appear to be stable, and she may be healing it in this position.  We will continue to follow her closely, if they have any issues, I have asked them return to clinic sooner.  Otherwise we will see her in approximately 3 months.   Follow-up: Return in about 3 months (around 01/08/2021). XR at next visit: AP pelvis, right hip x-ray  Subjective:  Chief Complaint  Patient presents with   Routine Post Op    Rt hip DOS 07/12/20    History of Present Illness: Carly Wood is a 85 y.o. female who presents following the above stated procedure.  Her right hip was 3 months ago.  Overall, she is doing well.  She remains in a skilled nursing facility, she requires considerable assistance.  She continues with dialysis for kidney disease, and is getting regular injections for multiple myeloma.  She has some pain over the lateral hip.  No pain in her groin.  According to her sister, she is not working on a regular basis with physical therapy.  She does not ambulate frequently at baseline.  She has mild dementia, but is answering most questions appropriately.   Review of Systems: No fevers or chills No numbness  or tingling No Chest Pain No shortness of breath   Objective: Ht '5\' 3"'  (1.6 m)   Wt 134 lb (60.8 kg)   BMI 23.74 kg/m   Physical Exam:  Elderly female.  Seated in a wheelchair.  Some confusion.    Right hip surgical incision is healing well, without surrounding erythema or drainage.  She has some mild tenderness to palpation over the proximal, lateral hip.  She tolerates internal and external rotation of the right hip.  She is able to maintain a straight leg raise.  She tolerates axial loading of the right hip.  She does not complain of any pain in her right groin.  Distally, her toes are warm and well-perfused.  She has active motion of the EHL and TA.  Sensation is intact distally  IMAGING: I personally ordered and reviewed the following images:  AP pelvis and right hip x-rays were obtained in clinic.  These images were compared to previous x-rays.  There appears to be some further, interval displacement of the lag screw.  There is no lag screw cut out at this point, although it does appear to be a little bit more superior within the femoral head.  Especially on the lateral view, there has been some interval migration of the fracture site, as it is collapsed into varus alignment overall.   Impression: Right intertrochanteric femur fracture in stable alignment, without hardware failure or considerable migration compared to previous x-rays.  Mordecai Rasmussen, MD  10/08/2020 5:08 PM

## 2020-10-09 DIAGNOSIS — N186 End stage renal disease: Secondary | ICD-10-CM | POA: Diagnosis not present

## 2020-10-09 DIAGNOSIS — Z992 Dependence on renal dialysis: Secondary | ICD-10-CM | POA: Diagnosis not present

## 2020-10-09 LAB — PROTEIN ELECTROPHORESIS, SERUM
A/G Ratio: 1.6 (ref 0.7–1.7)
Albumin ELP: 3.5 g/dL (ref 2.9–4.4)
Alpha-1-Globulin: 0.2 g/dL (ref 0.0–0.4)
Alpha-2-Globulin: 0.7 g/dL (ref 0.4–1.0)
Beta Globulin: 0.8 g/dL (ref 0.7–1.3)
Gamma Globulin: 0.5 g/dL (ref 0.4–1.8)
Globulin, Total: 2.2 g/dL (ref 2.2–3.9)
Total Protein ELP: 5.7 g/dL — ABNORMAL LOW (ref 6.0–8.5)

## 2020-10-11 DIAGNOSIS — N186 End stage renal disease: Secondary | ICD-10-CM | POA: Diagnosis not present

## 2020-10-11 DIAGNOSIS — Z992 Dependence on renal dialysis: Secondary | ICD-10-CM | POA: Diagnosis not present

## 2020-10-14 DIAGNOSIS — Z992 Dependence on renal dialysis: Secondary | ICD-10-CM | POA: Diagnosis not present

## 2020-10-14 DIAGNOSIS — N186 End stage renal disease: Secondary | ICD-10-CM | POA: Diagnosis not present

## 2020-10-16 DIAGNOSIS — N186 End stage renal disease: Secondary | ICD-10-CM | POA: Diagnosis not present

## 2020-10-16 DIAGNOSIS — Z992 Dependence on renal dialysis: Secondary | ICD-10-CM | POA: Diagnosis not present

## 2020-10-18 DIAGNOSIS — Z992 Dependence on renal dialysis: Secondary | ICD-10-CM | POA: Diagnosis not present

## 2020-10-18 DIAGNOSIS — N186 End stage renal disease: Secondary | ICD-10-CM | POA: Diagnosis not present

## 2020-10-21 ENCOUNTER — Inpatient Hospital Stay (HOSPITAL_COMMUNITY): Payer: Medicare PPO

## 2020-10-21 DIAGNOSIS — Z5111 Encounter for antineoplastic chemotherapy: Secondary | ICD-10-CM | POA: Diagnosis not present

## 2020-10-21 DIAGNOSIS — N186 End stage renal disease: Secondary | ICD-10-CM | POA: Diagnosis not present

## 2020-10-21 DIAGNOSIS — Z992 Dependence on renal dialysis: Secondary | ICD-10-CM | POA: Diagnosis not present

## 2020-10-21 DIAGNOSIS — C9000 Multiple myeloma not having achieved remission: Secondary | ICD-10-CM | POA: Diagnosis not present

## 2020-10-21 LAB — CBC WITH DIFFERENTIAL/PLATELET
Abs Immature Granulocytes: 0.02 10*3/uL (ref 0.00–0.07)
Basophils Absolute: 0.1 10*3/uL (ref 0.0–0.1)
Basophils Relative: 1 %
Eosinophils Absolute: 0.2 10*3/uL (ref 0.0–0.5)
Eosinophils Relative: 3 %
HCT: 39.1 % (ref 36.0–46.0)
Hemoglobin: 11.9 g/dL — ABNORMAL LOW (ref 12.0–15.0)
Immature Granulocytes: 0 %
Lymphocytes Relative: 17 %
Lymphs Abs: 0.9 10*3/uL (ref 0.7–4.0)
MCH: 29.6 pg (ref 26.0–34.0)
MCHC: 30.4 g/dL (ref 30.0–36.0)
MCV: 97.3 fL (ref 80.0–100.0)
Monocytes Absolute: 0.6 10*3/uL (ref 0.1–1.0)
Monocytes Relative: 11 %
Neutro Abs: 3.6 10*3/uL (ref 1.7–7.7)
Neutrophils Relative %: 68 %
Platelets: 260 10*3/uL (ref 150–400)
RBC: 4.02 MIL/uL (ref 3.87–5.11)
RDW: 16.6 % — ABNORMAL HIGH (ref 11.5–15.5)
WBC: 5.3 10*3/uL (ref 4.0–10.5)
nRBC: 0 % (ref 0.0–0.2)

## 2020-10-21 LAB — COMPREHENSIVE METABOLIC PANEL
ALT: 8 U/L (ref 0–44)
AST: 13 U/L — ABNORMAL LOW (ref 15–41)
Albumin: 3.5 g/dL (ref 3.5–5.0)
Alkaline Phosphatase: 96 U/L (ref 38–126)
Anion gap: 11 (ref 5–15)
BUN: 69 mg/dL — ABNORMAL HIGH (ref 8–23)
CO2: 27 mmol/L (ref 22–32)
Calcium: 9.1 mg/dL (ref 8.9–10.3)
Chloride: 95 mmol/L — ABNORMAL LOW (ref 98–111)
Creatinine, Ser: 9.48 mg/dL — ABNORMAL HIGH (ref 0.44–1.00)
GFR, Estimated: 4 mL/min — ABNORMAL LOW (ref >60)
Glucose, Bld: 164 mg/dL — ABNORMAL HIGH (ref 70–99)
Potassium: 3.9 mmol/L (ref 3.5–5.1)
Sodium: 133 mmol/L — ABNORMAL LOW (ref 135–145)
Total Bilirubin: 0.4 mg/dL (ref 0.3–1.2)
Total Protein: 6.2 g/dL — ABNORMAL LOW (ref 6.5–8.1)

## 2020-10-21 LAB — MAGNESIUM: Magnesium: 2.3 mg/dL (ref 1.7–2.4)

## 2020-10-22 ENCOUNTER — Inpatient Hospital Stay (HOSPITAL_COMMUNITY): Payer: Medicare PPO

## 2020-10-22 VITALS — BP 124/64 | HR 62 | Temp 97.1°F | Resp 17

## 2020-10-22 DIAGNOSIS — Z5111 Encounter for antineoplastic chemotherapy: Secondary | ICD-10-CM | POA: Diagnosis not present

## 2020-10-22 DIAGNOSIS — C9000 Multiple myeloma not having achieved remission: Secondary | ICD-10-CM

## 2020-10-22 MED ORDER — DEXAMETHASONE 4 MG PO TABS
10.0000 mg | ORAL_TABLET | Freq: Once | ORAL | Status: AC
  Administered 2020-10-22: 10 mg via ORAL
  Filled 2020-10-22: qty 3

## 2020-10-22 MED ORDER — PROCHLORPERAZINE MALEATE 10 MG PO TABS
10.0000 mg | ORAL_TABLET | Freq: Once | ORAL | Status: AC
  Administered 2020-10-22: 10 mg via ORAL
  Filled 2020-10-22: qty 1

## 2020-10-22 MED ORDER — BORTEZOMIB CHEMO SQ INJECTION 3.5 MG (2.5MG/ML)
1.3000 mg/m2 | Freq: Once | INTRAMUSCULAR | Status: AC
  Administered 2020-10-22: 2.25 mg via SUBCUTANEOUS
  Filled 2020-10-22: qty 0.9

## 2020-10-22 NOTE — Progress Notes (Signed)
Carly Wood presents today for injection per the provider's orders.  Velcade administration without incident; injection site WNL; see MAR for injection details.  Patient tolerated procedure well and without incident.  No questions or complaints noted at this time. Remained stable throughout procedure.  Discharged via wheelchair in [place] staff in stable condition.

## 2020-10-22 NOTE — Patient Instructions (Signed)
[facility]  Discharge Instructions: Thank you for choosing [facility] to provide your oncology and hematology care.  If you have a lab appointment with the Cancer Center, please come in thru the Main Entrance and check in at the main information desk.  Wear comfortable clothing and clothing appropriate for easy access to any Portacath or PICC line.   We strive to give you quality time with your provider. You may need to reschedule your appointment if you arrive late (15 or more minutes).  Arriving late affects you and other patients whose appointments are after yours.  Also, if you miss three or more appointments without notifying the office, you may be dismissed from the clinic at the provider's discretion.      For prescription refill requests, have your pharmacy contact our office and allow 72 hours for refills to be completed.    Today you received the following chemotherapy and/or immunotherapy agents Velcade.       To help prevent nausea and vomiting after your treatment, we encourage you to take your nausea medication as directed.  BELOW ARE SYMPTOMS THAT SHOULD BE REPORTED IMMEDIATELY: . *FEVER GREATER THAN 100.4 F (38 C) OR HIGHER . *CHILLS OR SWEATING . *NAUSEA AND VOMITING THAT IS NOT CONTROLLED WITH YOUR NAUSEA MEDICATION . *UNUSUAL SHORTNESS OF BREATH . *UNUSUAL BRUISING OR BLEEDING . *URINARY PROBLEMS (pain or burning when urinating, or frequent urination) . *BOWEL PROBLEMS (unusual diarrhea, constipation, pain near the anus) . TENDERNESS IN MOUTH AND THROAT WITH OR WITHOUT PRESENCE OF ULCERS (sore throat, sores in mouth, or a toothache) . UNUSUAL RASH, SWELLING OR PAIN  . UNUSUAL VAGINAL DISCHARGE OR ITCHING   Items with * indicate a potential emergency and should be followed up as soon as possible or go to the Emergency Department if any problems should occur.  Please show the CHEMOTHERAPY ALERT CARD or IMMUNOTHERAPY ALERT CARD at check-in to  the Emergency Department and triage nurse.  Should you have questions after your visit or need to cancel or reschedule your appointment, please contact [facility] [phone]  and follow the prompts.  Office hours are 8:00 a.m. to 4:30 p.m. Monday - Friday. Please note that voicemails left after 4:00 p.m. may not be returned until the following business day.  We are closed weekends and major holidays. You have access to a nurse at all times for urgent questions. Please call the main number to the clinic [phone] and follow the prompts.  For any non-urgent questions, you may also contact your provider using MyChart. We now offer e-Visits for anyone 18 and older to request care online for non-urgent symptoms. For details visit [email or web address].   Also download the MyChart app! Go to the app store, search "MyChart", open the app, select [facility], and log in with your MyChart username and password.  Due to Covid, a mask is required upon entering the hospital/clinic. If you do not have a mask, one will be given to you upon arrival. For doctor visits, patients may have 1 support person aged 18 or older with them. For treatment visits, patients cannot have anyone with them due to current Covid guidelines and our immunocompromised population.  

## 2020-10-23 DIAGNOSIS — Z992 Dependence on renal dialysis: Secondary | ICD-10-CM | POA: Diagnosis not present

## 2020-10-23 DIAGNOSIS — Z23 Encounter for immunization: Secondary | ICD-10-CM | POA: Diagnosis not present

## 2020-10-23 DIAGNOSIS — N186 End stage renal disease: Secondary | ICD-10-CM | POA: Diagnosis not present

## 2020-10-25 DIAGNOSIS — N186 End stage renal disease: Secondary | ICD-10-CM | POA: Diagnosis not present

## 2020-10-25 DIAGNOSIS — Z992 Dependence on renal dialysis: Secondary | ICD-10-CM | POA: Diagnosis not present

## 2020-10-28 ENCOUNTER — Inpatient Hospital Stay (HOSPITAL_COMMUNITY): Payer: Medicare PPO

## 2020-10-28 ENCOUNTER — Other Ambulatory Visit (HOSPITAL_COMMUNITY): Payer: Medicare PPO

## 2020-10-28 DIAGNOSIS — N186 End stage renal disease: Secondary | ICD-10-CM | POA: Diagnosis not present

## 2020-10-28 DIAGNOSIS — Z992 Dependence on renal dialysis: Secondary | ICD-10-CM | POA: Diagnosis not present

## 2020-10-29 ENCOUNTER — Inpatient Hospital Stay (HOSPITAL_COMMUNITY): Payer: Medicare PPO

## 2020-10-29 ENCOUNTER — Encounter: Payer: Self-pay | Admitting: Internal Medicine

## 2020-10-29 ENCOUNTER — Non-Acute Institutional Stay (SKILLED_NURSING_FACILITY): Payer: Medicare PPO | Admitting: Internal Medicine

## 2020-10-29 VITALS — BP 169/81 | HR 65 | Temp 96.7°F | Resp 18 | Wt 133.4 lb

## 2020-10-29 DIAGNOSIS — N186 End stage renal disease: Secondary | ICD-10-CM | POA: Diagnosis not present

## 2020-10-29 DIAGNOSIS — C9000 Multiple myeloma not having achieved remission: Secondary | ICD-10-CM

## 2020-10-29 DIAGNOSIS — F0151 Vascular dementia with behavioral disturbance: Secondary | ICD-10-CM | POA: Diagnosis not present

## 2020-10-29 DIAGNOSIS — F01518 Vascular dementia, unspecified severity, with other behavioral disturbance: Secondary | ICD-10-CM

## 2020-10-29 DIAGNOSIS — E1122 Type 2 diabetes mellitus with diabetic chronic kidney disease: Secondary | ICD-10-CM | POA: Diagnosis not present

## 2020-10-29 DIAGNOSIS — D509 Iron deficiency anemia, unspecified: Secondary | ICD-10-CM | POA: Diagnosis not present

## 2020-10-29 DIAGNOSIS — Z5111 Encounter for antineoplastic chemotherapy: Secondary | ICD-10-CM | POA: Diagnosis not present

## 2020-10-29 DIAGNOSIS — N184 Chronic kidney disease, stage 4 (severe): Secondary | ICD-10-CM

## 2020-10-29 LAB — COMPREHENSIVE METABOLIC PANEL
ALT: 10 U/L (ref 0–44)
AST: 16 U/L (ref 15–41)
Albumin: 3.7 g/dL (ref 3.5–5.0)
Alkaline Phosphatase: 98 U/L (ref 38–126)
Anion gap: 17 — ABNORMAL HIGH (ref 5–15)
BUN: 54 mg/dL — ABNORMAL HIGH (ref 8–23)
CO2: 24 mmol/L (ref 22–32)
Calcium: 9.4 mg/dL (ref 8.9–10.3)
Chloride: 93 mmol/L — ABNORMAL LOW (ref 98–111)
Creatinine, Ser: 7.35 mg/dL — ABNORMAL HIGH (ref 0.44–1.00)
GFR, Estimated: 5 mL/min — ABNORMAL LOW (ref >60)
Glucose, Bld: 190 mg/dL — ABNORMAL HIGH (ref 70–99)
Potassium: 4 mmol/L (ref 3.5–5.1)
Sodium: 134 mmol/L — ABNORMAL LOW (ref 135–145)
Total Bilirubin: 0.4 mg/dL (ref 0.3–1.2)
Total Protein: 6.5 g/dL (ref 6.5–8.1)

## 2020-10-29 LAB — CBC WITH DIFFERENTIAL/PLATELET
Abs Immature Granulocytes: 0.01 10*3/uL (ref 0.00–0.07)
Basophils Absolute: 0.1 10*3/uL (ref 0.0–0.1)
Basophils Relative: 1 %
Eosinophils Absolute: 0.3 10*3/uL (ref 0.0–0.5)
Eosinophils Relative: 5 %
HCT: 40 % (ref 36.0–46.0)
Hemoglobin: 12.1 g/dL (ref 12.0–15.0)
Immature Granulocytes: 0 %
Lymphocytes Relative: 24 %
Lymphs Abs: 1.3 10*3/uL (ref 0.7–4.0)
MCH: 29.2 pg (ref 26.0–34.0)
MCHC: 30.3 g/dL (ref 30.0–36.0)
MCV: 96.6 fL (ref 80.0–100.0)
Monocytes Absolute: 0.6 10*3/uL (ref 0.1–1.0)
Monocytes Relative: 11 %
Neutro Abs: 3.1 10*3/uL (ref 1.7–7.7)
Neutrophils Relative %: 59 %
Platelets: 218 10*3/uL (ref 150–400)
RBC: 4.14 MIL/uL (ref 3.87–5.11)
RDW: 16.2 % — ABNORMAL HIGH (ref 11.5–15.5)
WBC: 5.3 10*3/uL (ref 4.0–10.5)
nRBC: 0 % (ref 0.0–0.2)

## 2020-10-29 LAB — MAGNESIUM: Magnesium: 2.3 mg/dL (ref 1.7–2.4)

## 2020-10-29 MED ORDER — BORTEZOMIB CHEMO SQ INJECTION 3.5 MG (2.5MG/ML)
1.3000 mg/m2 | Freq: Once | INTRAMUSCULAR | Status: AC
  Administered 2020-10-29: 2.25 mg via SUBCUTANEOUS
  Filled 2020-10-29: qty 0.9

## 2020-10-29 MED ORDER — PROCHLORPERAZINE MALEATE 10 MG PO TABS
10.0000 mg | ORAL_TABLET | Freq: Once | ORAL | Status: AC
  Administered 2020-10-29: 10 mg via ORAL
  Filled 2020-10-29: qty 1

## 2020-10-29 MED ORDER — DEXAMETHASONE 4 MG PO TABS
10.0000 mg | ORAL_TABLET | Freq: Once | ORAL | Status: AC
  Administered 2020-10-29: 10 mg via ORAL
  Filled 2020-10-29: qty 3

## 2020-10-29 NOTE — Patient Instructions (Signed)
[facility]  Discharge Instructions: Thank you for choosing [facility] to provide your oncology and hematology care.  If you have a lab appointment with the [facility], please come in thru the Main Entrance and check in at the main information desk.  Wear comfortable clothing and clothing appropriate for easy access to any Portacath or PICC line.   We strive to give you quality time with your provider. You may need to reschedule your appointment if you arrive late (15 or more minutes).  Arriving late affects you and other patients whose appointments are after yours.  Also, if you miss three or more appointments without notifying the office, you may be dismissed from the clinic at the provider's discretion.      For prescription refill requests, have your pharmacy contact our office and allow 72 hours for refills to be completed.    Today you received the following chemotherapy and/or immunotherapy agents Velcade injection.   To help prevent nausea and vomiting after your treatment, we encourage you to take your nausea medication as directed.  BELOW ARE SYMPTOMS THAT SHOULD BE REPORTED IMMEDIATELY: *FEVER GREATER THAN 100.4 F (38 C) OR HIGHER *CHILLS OR SWEATING *NAUSEA AND VOMITING THAT IS NOT CONTROLLED WITH YOUR NAUSEA MEDICATION *UNUSUAL SHORTNESS OF BREATH *UNUSUAL BRUISING OR BLEEDING *URINARY PROBLEMS (pain or burning when urinating, or frequent urination) *BOWEL PROBLEMS (unusual diarrhea, constipation, pain near the anus) TENDERNESS IN MOUTH AND THROAT WITH OR WITHOUT PRESENCE OF ULCERS (sore throat, sores in mouth, or a toothache) UNUSUAL RASH, SWELLING OR PAIN  UNUSUAL VAGINAL DISCHARGE OR ITCHING   Items with * indicate a potential emergency and should be followed up as soon as possible or go to the Emergency Department if any problems should occur.  Please show the CHEMOTHERAPY ALERT CARD or IMMUNOTHERAPY ALERT CARD at check-in to the Emergency  Department and triage nurse.  Should you have questions after your visit or need to cancel or reschedule your appointment, please contact [facility] [phone]  and follow the prompts.  Office hours are 8:00 a.m. to 4:30 p.m. Monday - Friday. Please note that voicemails left after 4:00 p.m. may not be returned until the following business day.  We are closed weekends and major holidays. You have access to a nurse at all times for urgent questions. Please call the main number to the clinic [phone] and follow the prompts.  For any non-urgent questions, you may also contact your provider using MyChart. We now offer e-Visits for anyone 60 and older to request care online for non-urgent symptoms. For details visit [email or web address].   Also download the MyChart app! Go to the app store, search "MyChart", open the app, select [facility], and log in with your MyChart username and password.  Due to Covid, a mask is required upon entering the hospital/clinic. If you do not have a mask, one will be given to you upon arrival. For doctor visits, patients may have 1 support person aged 3 or older with them. For treatment visits, patients cannot have anyone with them due to current Covid guidelines and our immunocompromised population.

## 2020-10-29 NOTE — Progress Notes (Signed)
.  Carly Wood presents today for injection per the provider's orders.  Velcade administration without incident; injection site WNL; see MAR for injection details.  Patient tolerated procedure well and without incident.  No questions or complaints noted at this time. Pt stable during and after treatment. Pt discharged via wheelchair in satisfactory condition.

## 2020-10-29 NOTE — Progress Notes (Signed)
   NURSING HOME LOCATION:  [facility] ROOM NUMBER:  [id number] D  CODE STATUS:  DNR  PCP: Ok Edwards NP  This is a nursing facility follow up visit of chronic medical diagnoses & to document compliance with Regulation 483.30 (c) in [employer] [place] Manual Phase 2 which mandates caregiver visit ( visits can alternate among physician, PA or NP as per statutes) within 10 days of 30 days / 60 days/ 90 days post admission to SNF date    Interim medical record and care since last SNF visit was updated with review of diagnostic studies and change in clinical status since last visit were documented.  HPI: She is a permanent resident of this facility with medical diagnoses of anemia, insulin-dependent diabetes with ESRD, vascular dementia, GERD, essential hypertension, dyslipidemia, hx of multiple myeloma,and ductal carcinoma of the breast. She has had multiple vascular procedures in the context of her end-stage renal disease for which she is on hemodialysis 3 days a week.  Review of systems: Dementia invalidated responses.  Review of systems was essentially negative except for pain in the hip and the knee.  She had gone for infusion today; she stated she did not have an appointment initially.  Then she said that she went for "pills and shots but did not see the doctor".  Physical exam:  Pertinent or positive findings: She appears her age and somewhat chronically ill.  Hair is thin over the crown and eyebrows are also thin.  Teeth are coated.  Bronchovesicular breath sounds are noted.  Intermittently there is suggestion of a basilar murmur.  She has trace edema at the ankle.  Pedal pulses are decreased.  General appearance:  no acute distress, increased work of breathing is present.   Lymphatic: No lymphadenopathy about the head, neck, axilla. Eyes: No conjunctival inflammation or lid edema is present. There is no scleral icterus. Ears:  External ear exam shows no significant  lesions or deformities.   Nose:  External nasal examination shows no deformity or inflammation. Nasal mucosa are pink and moist without lesions, exudates Neck:  No thyromegaly, masses, tenderness noted.    Heart:  No gallop, click, rub .  Lungs:  without wheezes, rhonchi, rales, rubs. Abdomen: Bowel sounds are normal. Abdomen is soft and nontender with no organomegaly, hernias, masses. GU: Deferred  Extremities:  No cyanosis, clubbing  Neurologic exam :Balance, [place], finger to nose testing could not be completed due to clinical state Skin: Warm & dry w/o tenting. No significant lesions or rash.  See summary under each active problem in the Problem List with associated updated therapeutic plan

## 2020-10-29 NOTE — Assessment & Plan Note (Addendum)
Current H/H 12.1/40;prior H/H 11.9/39.1 Indices normocytic Anemia improved  No bleeding dyscrasias reported by Staff Monitor CBC

## 2020-10-29 NOTE — Patient Instructions (Signed)
See assessment and plan under each diagnosis in the problem list and acutely for this visit 

## 2020-10-29 NOTE — Assessment & Plan Note (Signed)
DM with neuro , renal & vascular complications Glucose range @ SNF: 78-206 Current A1c: 6.2 % A1c goal : < 8% No hypoglycemia No change indicated

## 2020-10-29 NOTE — Assessment & Plan Note (Signed)
No behavioral issues reported

## 2020-10-30 DIAGNOSIS — Z992 Dependence on renal dialysis: Secondary | ICD-10-CM | POA: Diagnosis not present

## 2020-10-30 DIAGNOSIS — N186 End stage renal disease: Secondary | ICD-10-CM | POA: Diagnosis not present

## 2020-11-01 DIAGNOSIS — N186 End stage renal disease: Secondary | ICD-10-CM | POA: Diagnosis not present

## 2020-11-01 DIAGNOSIS — Z992 Dependence on renal dialysis: Secondary | ICD-10-CM | POA: Diagnosis not present

## 2020-11-04 ENCOUNTER — Inpatient Hospital Stay (HOSPITAL_COMMUNITY): Payer: Medicare PPO

## 2020-11-04 ENCOUNTER — Other Ambulatory Visit (HOSPITAL_COMMUNITY): Payer: Self-pay | Admitting: Hematology

## 2020-11-04 ENCOUNTER — Other Ambulatory Visit (HOSPITAL_COMMUNITY): Payer: Medicare PPO

## 2020-11-04 DIAGNOSIS — Z992 Dependence on renal dialysis: Secondary | ICD-10-CM | POA: Diagnosis not present

## 2020-11-04 DIAGNOSIS — N186 End stage renal disease: Secondary | ICD-10-CM | POA: Diagnosis not present

## 2020-11-05 ENCOUNTER — Encounter (HOSPITAL_COMMUNITY): Payer: Self-pay

## 2020-11-05 ENCOUNTER — Encounter (HOSPITAL_COMMUNITY): Payer: Self-pay | Admitting: Hematology

## 2020-11-05 ENCOUNTER — Inpatient Hospital Stay (HOSPITAL_COMMUNITY): Payer: Medicare PPO

## 2020-11-05 VITALS — BP 170/60 | HR 61 | Temp 98.2°F | Resp 18 | Wt 133.2 lb

## 2020-11-05 DIAGNOSIS — C9000 Multiple myeloma not having achieved remission: Secondary | ICD-10-CM

## 2020-11-05 DIAGNOSIS — Z5111 Encounter for antineoplastic chemotherapy: Secondary | ICD-10-CM | POA: Diagnosis not present

## 2020-11-05 LAB — COMPREHENSIVE METABOLIC PANEL
ALT: 11 U/L (ref 0–44)
AST: 16 U/L (ref 15–41)
Albumin: 3.8 g/dL (ref 3.5–5.0)
Alkaline Phosphatase: 104 U/L (ref 38–126)
Anion gap: 13 (ref 5–15)
BUN: 49 mg/dL — ABNORMAL HIGH (ref 8–23)
CO2: 24 mmol/L (ref 22–32)
Calcium: 8.9 mg/dL (ref 8.9–10.3)
Chloride: 95 mmol/L — ABNORMAL LOW (ref 98–111)
Creatinine, Ser: 6.75 mg/dL — ABNORMAL HIGH (ref 0.44–1.00)
GFR, Estimated: 6 mL/min — ABNORMAL LOW (ref >60)
Glucose, Bld: 139 mg/dL — ABNORMAL HIGH (ref 70–99)
Potassium: 4.2 mmol/L (ref 3.5–5.1)
Sodium: 132 mmol/L — ABNORMAL LOW (ref 135–145)
Total Bilirubin: 0.4 mg/dL (ref 0.3–1.2)
Total Protein: 6.8 g/dL (ref 6.5–8.1)

## 2020-11-05 LAB — CBC WITH DIFFERENTIAL/PLATELET
Abs Immature Granulocytes: 0.02 10*3/uL (ref 0.00–0.07)
Basophils Absolute: 0.1 10*3/uL (ref 0.0–0.1)
Basophils Relative: 1 %
Eosinophils Absolute: 0.3 10*3/uL (ref 0.0–0.5)
Eosinophils Relative: 4 %
HCT: 39.1 % (ref 36.0–46.0)
Hemoglobin: 12.1 g/dL (ref 12.0–15.0)
Immature Granulocytes: 0 %
Lymphocytes Relative: 18 %
Lymphs Abs: 1.1 10*3/uL (ref 0.7–4.0)
MCH: 30 pg (ref 26.0–34.0)
MCHC: 30.9 g/dL (ref 30.0–36.0)
MCV: 96.8 fL (ref 80.0–100.0)
Monocytes Absolute: 0.8 10*3/uL (ref 0.1–1.0)
Monocytes Relative: 13 %
Neutro Abs: 4.2 10*3/uL (ref 1.7–7.7)
Neutrophils Relative %: 64 %
Platelets: 213 10*3/uL (ref 150–400)
RBC: 4.04 MIL/uL (ref 3.87–5.11)
RDW: 16.8 % — ABNORMAL HIGH (ref 11.5–15.5)
WBC: 6.5 10*3/uL (ref 4.0–10.5)
nRBC: 0 % (ref 0.0–0.2)

## 2020-11-05 LAB — MAGNESIUM: Magnesium: 2.4 mg/dL (ref 1.7–2.4)

## 2020-11-05 MED ORDER — BORTEZOMIB CHEMO SQ INJECTION 3.5 MG (2.5MG/ML)
1.3000 mg/m2 | Freq: Once | INTRAMUSCULAR | Status: AC
  Administered 2020-11-05: 2.25 mg via SUBCUTANEOUS
  Filled 2020-11-05: qty 0.9

## 2020-11-05 MED ORDER — DEXAMETHASONE 4 MG PO TABS
10.0000 mg | ORAL_TABLET | Freq: Once | ORAL | Status: AC
  Administered 2020-11-05: 10 mg via ORAL
  Filled 2020-11-05: qty 3

## 2020-11-05 MED ORDER — PROCHLORPERAZINE MALEATE 10 MG PO TABS
10.0000 mg | ORAL_TABLET | Freq: Once | ORAL | Status: AC
  Administered 2020-11-05: 10 mg via ORAL
  Filled 2020-11-05: qty 1

## 2020-11-05 NOTE — Progress Notes (Signed)
Patient tolerated Velcade injection with no complaints voiced. Lab work reviewed. See MAR for details. Injection site clean and dry with no bruising or swelling noted. Patient stable during and after injection. Band aid applied. VSS. Patient left in satisfactory condition with no s/s of distress noted. 

## 2020-11-05 NOTE — Patient Instructions (Signed)
[facility]  Discharge Instructions: Thank you for choosing [facility] to provide your oncology and hematology care.  If you have a lab appointment with the Cancer Center, please come in thru the Main Entrance and check in at the main information desk.  Wear comfortable clothing and clothing appropriate for easy access to any Portacath or PICC line.   We strive to give you quality time with your provider. You may need to reschedule your appointment if you arrive late (15 or more minutes).  Arriving late affects you and other patients whose appointments are after yours.  Also, if you miss three or more appointments without notifying the office, you may be dismissed from the clinic at the provider's discretion.      For prescription refill requests, have your pharmacy contact our office and allow 72 hours for refills to be completed.    Today you received the following chemotherapy and/or immunotherapy agents: Velcade. Return as scheduled.   To help prevent nausea and vomiting after your treatment, we encourage you to take your nausea medication as directed.  BELOW ARE SYMPTOMS THAT SHOULD BE REPORTED IMMEDIATELY: *FEVER GREATER THAN 100.4 F (38 C) OR HIGHER *CHILLS OR SWEATING *NAUSEA AND VOMITING THAT IS NOT CONTROLLED WITH YOUR NAUSEA MEDICATION *UNUSUAL SHORTNESS OF BREATH *UNUSUAL BRUISING OR BLEEDING *URINARY PROBLEMS (pain or burning when urinating, or frequent urination) *BOWEL PROBLEMS (unusual diarrhea, constipation, pain near the anus) TENDERNESS IN MOUTH AND THROAT WITH OR WITHOUT PRESENCE OF ULCERS (sore throat, sores in mouth, or a toothache) UNUSUAL RASH, SWELLING OR PAIN  UNUSUAL VAGINAL DISCHARGE OR ITCHING   Items with * indicate a potential emergency and should be followed up as soon as possible or go to the Emergency Department if any problems should occur.  Please show the CHEMOTHERAPY ALERT CARD or IMMUNOTHERAPY ALERT CARD at check-in to  the Emergency Department and triage nurse.  Should you have questions after your visit or need to cancel or reschedule your appointment, please contact [facility] [phone]  and follow the prompts.  Office hours are 8:00 a.m. to 4:30 p.m. Monday - Friday. Please note that voicemails left after 4:00 p.m. may not be returned until the following business day.  We are closed weekends and major holidays. You have access to a nurse at all times for urgent questions. Please call the main number to the clinic [phone] and follow the prompts.  For any non-urgent questions, you may also contact your provider using MyChart. We now offer e-Visits for anyone 18 and older to request care online for non-urgent symptoms. For details visit [email or web address].   Also download the MyChart app! Go to the app store, search "MyChart", open the app, select [facility], and log in with your MyChart username and password.  Due to Covid, a mask is required upon entering the hospital/clinic. If you do not have a mask, one will be given to you upon arrival. For doctor visits, patients may have 1 support person aged 18 or older with them. For treatment visits, patients cannot have anyone with them due to current Covid guidelines and our immunocompromised population.  

## 2020-11-06 DIAGNOSIS — N186 End stage renal disease: Secondary | ICD-10-CM | POA: Diagnosis not present

## 2020-11-06 DIAGNOSIS — Z992 Dependence on renal dialysis: Secondary | ICD-10-CM | POA: Diagnosis not present

## 2020-11-07 ENCOUNTER — Encounter: Payer: Self-pay | Admitting: Adult Health

## 2020-11-07 ENCOUNTER — Non-Acute Institutional Stay (SKILLED_NURSING_FACILITY): Payer: Medicare PPO | Admitting: Adult Health

## 2020-11-07 DIAGNOSIS — I700 Atherosclerosis of aorta: Secondary | ICD-10-CM | POA: Diagnosis not present

## 2020-11-07 DIAGNOSIS — F0151 Vascular dementia with behavioral disturbance: Secondary | ICD-10-CM | POA: Diagnosis not present

## 2020-11-07 DIAGNOSIS — Z992 Dependence on renal dialysis: Secondary | ICD-10-CM

## 2020-11-07 DIAGNOSIS — N186 End stage renal disease: Secondary | ICD-10-CM | POA: Diagnosis not present

## 2020-11-07 DIAGNOSIS — E1122 Type 2 diabetes mellitus with diabetic chronic kidney disease: Secondary | ICD-10-CM | POA: Diagnosis not present

## 2020-11-07 DIAGNOSIS — F01518 Vascular dementia, unspecified severity, with other behavioral disturbance: Secondary | ICD-10-CM

## 2020-11-07 DIAGNOSIS — I70209 Unspecified atherosclerosis of native arteries of extremities, unspecified extremity: Secondary | ICD-10-CM

## 2020-11-07 NOTE — Progress Notes (Signed)
Location:  [facility] Room Number: 148-D Place of Service:  SNF (31)   CODE STATUS: DNR  Allergies  Allergen Reactions   Ace Inhibitors Other (See Comments) and Cough    Tongue swell , ie angioedema   Angiotensin Receptor Blockers     Angioedema with ACE-I   Penicillins Other (See Comments)    Unsure of reaction Has patient had a PCN reaction causing immediate rash, facial/tongue/throat swelling, SOB or lightheadedness with hypotension: Unknown Has patient had a PCN reaction causing severe rash involving mucus membranes or skin necrosis: Unknown Has patient had a PCN reaction that required hospitalization: No Has patient had a PCN reaction occurring within the last 10 years: Unknown If all of the above answers are "NO", then may proceed with Cephalosporin use.     Penicillin G     Chief Complaint  Patient presents with   Acute Visit    Care plan meeting    HPI:  We have come together for her care plan meeting. BIMS 10/15 mood 3/30: decreased energy. She is nonambulatory. She requires limited assist to extensive assist with her adls. She is occasionally incontinent of bladder and frequently incontinent of bowel. She has had one fall without injury. Her cbg readings are stable. She has a dialysis assess on her chest wall. Dietary: weight is 138.6 pounds: her weight fluctuates due to dialysis. She has a good appetite; is on a regular diet. No therapy at this time. She continues to be followed for her chronic illnesses including: Atherosclerotic peripheral vascular disease   Aortic atherosclerosis Vascular dementia with behavioral disturbance   Chronic kidney disease with end stage renal disease on dialysis due to type 2 diabetes mellitus  Past Medical History:  Diagnosis Date   Anemia     chronic macrocytic anemia   Anxiety    Chronic kidney disease    Chronic renal disease, stage 4, severely decreased glomerular filtration rate (GFR) between 15-29  mL/min/1.73 square meter (HCC) 56/31/4970   Complication of anesthesia    delirious after Breast Surgery   Dementia ([place])    mild   Depression    Diabetes mellitus with ESRD (end-stage renal disease) ([place])    type II   Dysphagia    Dyspnea    with activity   GERD (gastroesophageal reflux disease)    Glaucoma    Hyperlipidemia    Hypertension    Pneumonia    Stage 1 infiltrating ductal carcinoma of right female breast ([place]) 08/21/2015   ER+ PR+ HER 2 neu + (3+) T1cN0     Past Surgical History:  Procedure Laterality Date   A/V FISTULAGRAM Right 07/05/2020   Procedure: A/V Fistulagram;  Surgeon: Elam Dutch, MD;  Location: [facility];  Service: Cardiovascular;  Laterality: Right;   AV FISTULA PLACEMENT Left 11/22/2017   Procedure: ARTERIOVENOUS (AV) FISTULA CREATION LEFT ARM;  Surgeon: Elam Dutch, MD;  Location: [facility] OR;  Service: Vascular;  Laterality: Left;   AV FISTULA PLACEMENT Right 04/04/2020   Procedure: RIGHT ARM ARTERIOVENOUS FISTULA CREATION;  Surgeon: Rosetta Posner, MD;  Location: [facility];  Service: Vascular;  Laterality: Right;   AV FISTULA PLACEMENT Right 08/20/2020   Procedure: ARTERIOVENOUS (AV) FISTULA LIGATION RIGHT ARM;  Surgeon: Elam Dutch, MD;  Location: [facility];  Service: Vascular;  Laterality: Right;   BIOPSY  08/07/2016   Procedure: BIOPSY;  Surgeon: Daneil Dolin, MD;  [facility];  Service: Endoscopy;;  gastric  ulcer biopsy   COLONOSCOPY     ESOPHAGOGASTRODUODENOSCOPY N/A 08/07/2016   LA Grade A esophagitis s/p dilation, small hiatal hernia, multiple gastric ulcers and erosions, duodenal erosions s/p biopsy. Negative H.pylori    ESOPHAGOGASTRODUODENOSCOPY N/A 11/27/2016   normal esophagus, previously noted gastric ulcers completely healed, normal duodenum.    ESOPHAGOGASTRODUODENOSCOPY (EGD) WITH PROPOFOL N/A 07/23/2020   Procedure: ESOPHAGOGASTRODUODENOSCOPY (EGD) WITH PROPOFOL;  Surgeon: Rogene Houston, MD;  [facility];  Service: Endoscopy;  Laterality: N/A;   FISTULA SUPERFICIALIZATION Left 02/14/2018   Procedure: FISTULA SUPERFICIALIZATION LEFT ARM;  Surgeon: Angelia Mould, MD;  Location: [facility] OR;  Service: Vascular;  Laterality: Left;   FRACTURE SURGERY Right    ankle   HOT HEMOSTASIS  07/23/2020   Procedure: HOT HEMOSTASIS (ARGON PLASMA COAGULATION/BICAP);  Surgeon: Rogene Houston, MD;  [facility];  Service: Endoscopy;;   INTRAMEDULLARY (IM) NAIL INTERTROCHANTERIC Right 07/12/2020   Procedure: INTRAMEDULLARY (IM) NAIL INTERTROCHANTRIC;  Surgeon: Mordecai Rasmussen, MD;  [facility];  Service: Orthopedics;  Laterality: Right;   MALONEY DILATION N/A 08/07/2016   Procedure: Venia Minks DILATION;  Surgeon: Daneil Dolin, MD;  [facility];  Service: Endoscopy;  Laterality: N/A;   MASTECTOMY, PARTIAL Right    multiple myeloma     PERIPHERAL VASCULAR BALLOON ANGIOPLASTY Left 07/13/2019   Procedure: PERIPHERAL VASCULAR BALLOON ANGIOPLASTY;  Surgeon: Marty Heck, MD;  Location: [facility];  Service: Cardiovascular;  Laterality: Left;  arm fistulogram   PERIPHERAL VASCULAR BALLOON ANGIOPLASTY Right 05/22/2020   Procedure: PERIPHERAL VASCULAR BALLOON ANGIOPLASTY;  Surgeon: Cherre Robins, MD;  Location: [facility];  Service: Cardiovascular;  Laterality: Right;  arm fistula   PERIPHERAL VASCULAR BALLOON ANGIOPLASTY Right 07/05/2020   Procedure: PERIPHERAL VASCULAR BALLOON ANGIOPLASTY;  Surgeon: Elam Dutch, MD;  Location: [facility];  Service: Cardiovascular;  Laterality: Right;  arm fistula   PORT-A-CATH REMOVAL Left 11/22/2017   Procedure: REMOVAL PORT-A-CATH LEFT CHEST;  Surgeon: Elam Dutch, MD;  Location: [facility];  Service: Vascular;  Laterality: Left;   RETINAL DETACHMENT SURGERY Right    SCLEROTHERAPY  07/23/2020   Procedure: SCLEROTHERAPY;  Surgeon: Rogene Houston, MD;  [facility];  Service: Endoscopy;;   UPPER EXTREMITY  VENOGRAPHY N/A 08/22/2020   Procedure: LEFT UPPER & CENTRAL VENOGRAPHY;  Surgeon: Marty Heck, MD;  Location: [facility];  Service: Cardiovascular;  Laterality: N/A;    Social History   Socioeconomic History   Marital status: Single    Spouse name: Not on file   Number of children: Not on file   Years of education: Not on file   Highest education level: Not on file  Occupational History   Occupation: retired   Tobacco Use   Smoking status: Never   Smokeless tobacco: Never  Vaping Use   Vaping Use: Never used  Substance and Sexual Activity   Alcohol use: No    Alcohol/week: 0.0 standard drinks   Drug use: No   Sexual activity: Never  Other Topics Concern   Not on file  Social History Narrative   Long term resident of SNF    Social Determinants of Health   Financial Resource Strain: Low Risk    Difficulty of Paying Living Expenses: Not very hard  Food Insecurity: No Food Insecurity   Worried About Running Out of Food in the Last Year: Never true   [name] in the  Last Year: Never true  Transportation Needs: No Transportation Needs   Lack of Transportation (Medical): No   Lack of Transportation (Non-Medical): No  Physical Activity: Inactive   Days of Exercise per Week: 0 days   Minutes of Exercise per Session: 0 min  Stress: No Stress Concern Present   Feeling of Stress : Not at all  Social Connections: Moderately Isolated   Frequency of Communication with Friends and Family: More than three times a week   Frequency of Social Gatherings with Friends and Family: Once a week   Attends Religious Services: More than 4 times per year   Active Member of [employer] or Organizations: No   Attends Music therapist: Never   Marital Status: Never married  Human resources officer Violence: Not At Risk   Fear of Current or Ex-Partner: No   Emotionally Abused: No   Physically Abused: No   Sexually Abused: No   Family History  Problem Relation Age of  Onset   Multiple myeloma Sister    Brain cancer Sister    Dementia Mother        died at 62   Stroke Mother    Heart failure Mother    Diabetes Mother    Heart disease Father    Prostate cancer Brother    Colon cancer Neg Hx       VITAL SIGNS BP 138/75   Pulse 60   Temp (!) 97.3 F (36.3 C)   Resp 20   Ht _0  (1.6 m)   Wt 138 lb 9.6 oz (62.9 kg)   SpO2 97%   BMI 24.55 kg/m   Outpatient Encounter Medications as of 11/07/2020  Medication Sig   acetaminophen (TYLENOL) 650 MG CR tablet Take 650 mg by mouth every 6 (six) hours. (0000, 0600, 1200 & 1800)   acyclovir (ZOVIRAX) 200 MG capsule Take 200 mg by mouth in the morning. (0800)   Amino Acids-Protein Hydrolys (FEEDING SUPPLEMENT, PRO-STAT SUGAR FREE 64,) LIQD Take 30 mLs by mouth 3 (three) times daily with meals. (0800, 1200 & 1800)   anastrozole (ARIMIDEX) 1 MG tablet TAKE 1 TABLET BY MOUTH DAILY   atenolol (TENORMIN) 25 MG tablet Take 1 tablet (25 mg total) by mouth in the morning. (0800)   atorvastatin (LIPITOR) 10 MG tablet Take 10 mg by mouth at bedtime. (2000)   Balsam [place]-Castor Oil (VENELEX) OINT Apply topically. Apply to bilateral buttocks and sacrum every shift.   Calcium Acetate 667 MG TABS Take 667-1,334 mg by mouth See admin instructions. Take 2 tablets (1334 mg) by mouth with each meal (0800 &1730) and take 1 tablet (667 mg) by mouth with each snack (2000)   calcium-vitamin D (OSCAL WITH D) 500-200 MG-UNIT tablet Take 1 tablet by mouth in the morning. (0800)   cloNIDine (CATAPRES) 0.1 MG tablet Take 1 tablet (0.1 mg total) by mouth at bedtime. (2000) Hold for SBP < 110 bpm   insulin glargine (LANTUS SOLOSTAR) 100 UNIT/ML Solostar Pen Inject 10 Units into the skin at bedtime. (2000)   insulin lispro (HUMALOG) 100 UNIT/ML injection Inject 5 Units into the skin 3 (three) times daily with meals. (0800, 1200 & 1800) Give for CBG>150.   Insulin Pen Needle (BD AUTOSHIELD DUO) 30G X 5 MM MISC    melatonin 3 MG TABS  tablet Take 6 mg by mouth at bedtime. (2000) For Sleep   multivitamin (RENA-VIT) TABS tablet Take 1 tablet by mouth at bedtime. (2100)   NON FORMULARY Diet-Regular Diet  with thin liquids   Nutritional Supplements (FEEDING SUPPLEMENT, NEPRO CARB STEADY,) LIQD Take 237 mLs by mouth at bedtime. (2000)   ondansetron (ZOFRAN) 4 MG tablet Take 1 tablet (4 mg total) by mouth every 6 (six) hours as needed for nausea.   pantoprazole (PROTONIX) 20 MG tablet Take by mouth.   sertraline (ZOLOFT) 50 MG tablet Take 50 mg by mouth in the morning. (0800)   sucralfate (CARAFATE) 1 GM/10ML suspension Take 1 g by mouth in the morning, at noon, in the evening, and at bedtime. (0800, 1200, 1700 & 2100)   amLODipine (NORVASC) 10 MG tablet    BORTEZOMIB IJ Inject as directed once a week.   insulin NPH-regular Human (NOVOLIN 70/30) (70-30) 100 UNIT/ML injection Inject into the skin.   oxyCODONE-acetaminophen (PERCOCET) 5-325 MG tablet Take 1 tablet by mouth every 6 (six) hours as needed for severe pain.   No facility-administered encounter medications on file as of 11/07/2020.     SIGNIFICANT DIAGNOSTIC EXAMS  PREVIOUS   07-11-20: chest x-ray:  Central catheter tip at cavoatrial junction. No pneumothorax. Mild left base atelectasis. No edema or airspace opacity. Stable cardiac silhouette. Aortic Atherosclerosis   07-11-20: right knee x-ray:  No acute fracture or dislocation is noted. Diffuse vascular calcifications are seen. No soft tissue abnormality is noted.  07-11-20: right hip x-ray:  Acute, impacted, angulated right basicervical femoral neck fracture.   07-23-20: upper endoscopy:  Normal hypopharynx. - Normal esophagus. - Z-line irregular, 34 cm from the incisors. - Non-bleeding gastric ulcers with no stigmata of bleeding. - Duodenal ulcer with stigmata of bleed/ pigmented material. Gold probe therapy was not successful and hemostasis achieved with injection of dilute epinephrine. - Non-bleeding duodenal  ulcer with no stigmata of bleeding(second bulbar ulcer) - Normal second portion of the duodenum. - No specimens collected.  07-23-20: chest x-ray:No active disease. Aortic Atherosclerosis    NO NEW EXAMS.     LABS REVIEWED PREVIOUS  11-14-19; wbc 8.3; hgb 9.7; hct 31.2 mcv 116.4 plt 261; glucose 198; bun 44; creat 6.66; k+ 5.1; na++ 138; ca 8.8 liver normal albumin 3.9   12-12-19: wbc 7.6; hgb 11.3; hct 35.6; mcv 109.9 plt 212; glucose 602; bun 67; creat 8.62; k+ 5.6; na++ 135; ca 7.9 liver normal albumin 3.5  12-20-19: hgb a1c 8.0  01-09-20: wbc 6.9; hgb 12.1; hct 39.7; mcv 105.6 plt 299; glucose 225; bun 112; creat 14.53; k+ 6.4; na++ 132; ca 8.3 liver normal albumin 3.5  01-23-20: k+ 6.6  01-23-20: wbc 7.9; hgb 11.6; hct 36.9; mcv 103.7 plt 359; glucose 139; bun 109; creat 15.46; k+ 6.1; na++ 140; ca 8.7 liver normal albumin 4.1;  01-24-20: k+ 4.4  01-30-20: wbc 10.6; hgb 9.7; hct 32.0; mcv 103.6 plt 390; glucose 164; bun 44; creat 9.08; k+ 4.1; na++ 139; ca 9.0 liver normal albumin 3.3 02-06-20: wbc 7.8; hgb 9.9; hct 32.7 mcv 104.8 plt 344; glucose 211; bun 90; creat 10.97; k+ 6.0 na++ 137; ca 8.8 liver normal albumin 3.4  03-26-20: wbc 6.3; hgb 9.4; hct 30.4; mcv 104.1; plt 300; glucose 223; bun 101; creat 14.22; k+ 5.8; na++ 135; ca 8.1 GFR 2; liver normal albumin 3.4  05-21-20; wbc 5.1; hgb 10.4; hct 32.8; mcv 106.8 plt 303; glucose 186; bun 41; creat 1.54; k+ 5.0; na++ 132; ca 8.4 GFR 5; liver normal albumin 3.7 06-13-20: chol 189; ldl 106; trig 211; hdl 41; hgb a1c 6.2   07-11-20: wbc 9.1; hgb 10.6; hct 34.6; mcv 109.1 plt 248;  glucose 79; bun 45; creat 7.71 k+ 4.9; na++ 134; ca 8.1 GFR 5; liver normal albumin 3.5 mag 2.4 07-13-20: wbc 11.4; hgb 8.9; hct 29.7; mcv 108.8 plt 303; glucose 178; bun 66; creat 11.17; k+ 6.6; na++ 135; ca 8.2; GFR 3 07-15-20: wbc 11.0; hgb 7.5; hct 25.2; mcv 108.2 plt 251; glucose 117; bun 60; creat 9.41; k+ 5.5; na++ 138; ca 8.4 GFR 4 07-17-20: wbc 11.5; hgb 9.4; hct  29.8; mcv 100.3 plt 300; glucose 140; bun 68; creat 8.04; k+ 4.2; na++ 135; ca 8.5 GFR 5 07-22-20: wbc 11.6; hgb 7.1; hct 23.5; mcv 104.0 plt 444; glucose 214; bun 97; creat 9.18; k+ 4.5; na++ 140; ca 8.0; GFR4 07-24-20: wbc 14.3; hgb 6.7; hct 21.2; mcv 99.5 plt 385; glucose 96; bun 49; creat 6.55; k+ 4.7; na++ 133; ca 8.0; GFR4; mag 2.0 phos 3.8 07-26-20: wbc 10.4; hgb 8.5; hct 26.8; mcv 98.9 plt 430; glucose 111; bun 40;creat 6.90; k+ 4.2; na++ 133; ca 8.5; GFR 6; mag 2.2; phos 3.3    07-30-20: wbc 7.6; hgb 7.3; hct 24.1; mcv 103.4 plt 345; glucose 97; bun 26; creat 5.89; k+ 4.1; na++ 139; ca 8.6; GFR 7; liver normal albumin 2.8.   NO NEW LABS.   Review of Systems  Constitutional:  Negative for malaise/fatigue.  Respiratory:  Negative for cough and shortness of breath.   Cardiovascular:  Negative for chest pain, palpitations and leg swelling.  Gastrointestinal:  Negative for abdominal pain, constipation and heartburn.  Musculoskeletal:  Negative for back pain, joint pain and myalgias.  Skin: Negative.   Neurological:  Negative for dizziness.  Psychiatric/Behavioral:  The patient is not nervous/anxious.    Physical Exam Constitutional:      General: She is not in acute distress.    Appearance: She is well-developed. She is not diaphoretic.  Neck:     Thyroid: No thyromegaly.  Cardiovascular:     Rate and Rhythm: Normal rate and regular rhythm.     Pulses: Normal pulses.     Heart sounds: Normal heart sounds.  Pulmonary:     Effort: Pulmonary effort is normal. No respiratory distress.     Breath sounds: Normal breath sounds.  Abdominal:     General: Bowel sounds are normal. There is no distension.     Palpations: Abdomen is soft.     Tenderness: There is no abdominal tenderness.  Musculoskeletal:     Cervical back: Neck supple.     Right lower leg: No edema.     Left lower leg: No edema.     Comments:  Right hip IM nail 07-12-20      Lymphadenopathy:     Cervical: No cervical  adenopathy.  Skin:    General: Skin is warm and dry.     Comments: Dialysis access chest wall   Neurological:     Mental Status: She is alert. Mental status is at baseline.  Psychiatric:        Mood and Affect: Mood normal.      ASSESSMENT/ PLAN:  TODAY  Atherosclerotic peripheral vascular disease Aortic atherosclerosis Vascular dementia with behavioral disturbance Chronic kidney disease with end stage renal disease on dialysis due to type 2 diabetes mellitus   Will continue current medications Will continue current plan of care Will continue to monitor her status.   Time spent with patient: 40 minutes; medications; plan of care.    Ok Edwards NP [facility]  Contact [phone] Monday through Friday 8am- 5pm  After  hours call [phone]

## 2020-11-08 DIAGNOSIS — Z992 Dependence on renal dialysis: Secondary | ICD-10-CM | POA: Diagnosis not present

## 2020-11-08 DIAGNOSIS — N186 End stage renal disease: Secondary | ICD-10-CM | POA: Diagnosis not present

## 2020-11-11 DIAGNOSIS — Z992 Dependence on renal dialysis: Secondary | ICD-10-CM | POA: Diagnosis not present

## 2020-11-11 DIAGNOSIS — N186 End stage renal disease: Secondary | ICD-10-CM | POA: Diagnosis not present

## 2020-11-13 ENCOUNTER — Inpatient Hospital Stay (HOSPITAL_COMMUNITY): Payer: Medicare PPO | Attending: Hematology

## 2020-11-13 DIAGNOSIS — Z992 Dependence on renal dialysis: Secondary | ICD-10-CM | POA: Diagnosis not present

## 2020-11-13 DIAGNOSIS — E785 Hyperlipidemia, unspecified: Secondary | ICD-10-CM | POA: Insufficient documentation

## 2020-11-13 DIAGNOSIS — N186 End stage renal disease: Secondary | ICD-10-CM | POA: Insufficient documentation

## 2020-11-13 DIAGNOSIS — Z79811 Long term (current) use of aromatase inhibitors: Secondary | ICD-10-CM | POA: Insufficient documentation

## 2020-11-13 DIAGNOSIS — C9000 Multiple myeloma not having achieved remission: Secondary | ICD-10-CM | POA: Insufficient documentation

## 2020-11-13 DIAGNOSIS — C50919 Malignant neoplasm of unspecified site of unspecified female breast: Secondary | ICD-10-CM | POA: Insufficient documentation

## 2020-11-13 DIAGNOSIS — E1122 Type 2 diabetes mellitus with diabetic chronic kidney disease: Secondary | ICD-10-CM | POA: Insufficient documentation

## 2020-11-13 DIAGNOSIS — M8580 Other specified disorders of bone density and structure, unspecified site: Secondary | ICD-10-CM | POA: Insufficient documentation

## 2020-11-13 DIAGNOSIS — Z5111 Encounter for antineoplastic chemotherapy: Secondary | ICD-10-CM | POA: Insufficient documentation

## 2020-11-13 DIAGNOSIS — I120 Hypertensive chronic kidney disease with stage 5 chronic kidney disease or end stage renal disease: Secondary | ICD-10-CM | POA: Insufficient documentation

## 2020-11-15 DIAGNOSIS — N186 End stage renal disease: Secondary | ICD-10-CM | POA: Diagnosis not present

## 2020-11-15 DIAGNOSIS — Z992 Dependence on renal dialysis: Secondary | ICD-10-CM | POA: Diagnosis not present

## 2020-11-18 DIAGNOSIS — N186 End stage renal disease: Secondary | ICD-10-CM | POA: Diagnosis not present

## 2020-11-18 DIAGNOSIS — Z992 Dependence on renal dialysis: Secondary | ICD-10-CM | POA: Diagnosis not present

## 2020-11-18 NOTE — Progress Notes (Signed)
[place], [place] [id number]   CLINIC:  Medical Oncology/Hematology  PCP:  Gerlene Fee, NP [address] [place] [address] [phone]   REASON FOR VISIT:  Follow-up for plasma cell myeloma and anemia  PRIOR THERAPY: none  NGS Results: not done  CURRENT THERAPY: Velcade & Decadron 3/4 weeks  BRIEF ONCOLOGIC HISTORY:  Oncology History  Stage 1 infiltrating ductal carcinoma of right female breast ([place])  09/12/2014 Mammogram   Mass in upper outer R breast, middle third depth appears slightly larger than it was on prior exam with more irreg spiculated margins   10/02/2014 Pathology Results   biopsy with invasive ductal carcinoma high grade 1.1 cm, dcis solid type. additional R breast tissue, excision, invasive ductal high grade 0.9 cm    10/24/2014 Pathology Results   no residual invasive carcinoma, DCIS, focal, atypical ductal hyperplasia, 0/7 LN positive for metastatic carcinoma ER > 90%, PR 30%, HER 2 2+   10/24/2014 Cancer Staging   T1cN0M0   10/24/2014 Surgery   R mastectomy, T1c, N0   12/28/2014 - 11/22/2015 Chemotherapy   Taxol/herceptin weekly X 12, Herceptin every 21 days, last due in September 2017   03/11/2015 -  Anti-estrogen oral therapy   Arimidex 1 mg daily   09/12/2015 Imaging   MUGA- The left ventricular ejection fraction equals 68%.   06/08/2016 Treatment Plan Change   Started Nerlynx   Multiple myeloma ([place])  10/04/2018 Initial Diagnosis   Multiple myeloma ([place])   10/11/2018 -  Chemotherapy    Patient is on Treatment Plan: MYELOMA NON-TRANSPLANT CANDIDATES VRD WEEKLY D21D          CANCER STAGING: Cancer Staging Stage 1 infiltrating ductal carcinoma of right female breast [facility]) Staging form: Breast, AJCC 7th Edition - Clinical stage from 08/30/2015: Stage IA (T1c, N0, M0) - Signed by Baird Cancer, PA-C on 08/30/2015   INTERVAL HISTORY:  Ms. Carly Wood, a 85 y.o. female, returns for  routine follow-up and consideration for next cycle of chemotherapy. Carly Wood was last seen on 09/24/2020.  Due for cycle #28 of VRd today.   Overall, she tells me she has been feeling pretty well. She denies tingling and numbness in the hands and feet, new pains, and diarrhea, and she reports one episode of nausea and vomiting this morning. She denies any falls since her last visit.  Overall, she feels ready for next cycle of chemo today.   REVIEW OF SYSTEMS:  Review of Systems  Constitutional:  Negative for appetite change and fatigue (60%).  Respiratory:  Positive for cough.   Gastrointestinal:  Positive for nausea and vomiting. Negative for diarrhea.  Musculoskeletal:  Negative for arthralgias and myalgias.  Neurological:  Negative for numbness.  All other systems reviewed and are negative.  PAST MEDICAL/SURGICAL HISTORY:  Past Medical History:  Diagnosis Date   Anemia     chronic macrocytic anemia   Anxiety    Chronic kidney disease    Chronic renal disease, stage 4, severely decreased glomerular filtration rate (GFR) between 15-29 mL/min/1.73 square meter ([place]) 44/31/5400   Complication of anesthesia    delirious after Breast Surgery   Dementia ([place])    mild   Depression    Diabetes mellitus with ESRD (end-stage renal disease) ([place])    type II   Dysphagia    Dyspnea    with activity   GERD (gastroesophageal reflux disease)    Glaucoma    Hyperlipidemia  Hypertension    Pneumonia    Stage 1 infiltrating ductal carcinoma of right female breast ([place]arly [place]) 08/21/2015   ER+ PR+ HER 2 neu + (3+) T1cN0    Past Surgical History:  Procedure Laterality Date   A/V FISTULAGRAM Right 07/05/2020   Procedure: A/V Fistulagram;  Surgeon: Elam Dutch, MD;  Location: [place] [facility] LAB;  Service: Cardiovascular;  Laterality: Right;   AV FISTULA PLACEMENT Left 11/22/2017   Procedure: ARTERIOVENOUS (AV) FISTULA CREATION LEFT ARM;  Surgeon: Elam Dutch, MD;  [facility] OR;   Service: Vascular;  Laterality: Left;   AV FISTULA PLACEMENT Right 04/04/2020   Procedure: RIGHT ARM ARTERIOVENOUS FISTULA CREATION;  Surgeon: Rosetta Posner, MD;  Location: [facility] OR[facility];  Service: Vascular;  Laterality: Right;   AV FISTULA PLACEMENT Right 08/20/2020   Procedure: ARTERIOVENOUS (AV) FISTULA LIGATION RIGHT ARM;  Surgeon: Elam Dutch, MD;  Location: [facility] OR;  Service: Vascular;  Laterality: Right;   BIOPSY  08/07/2016   Procedure: BIOPSY;  Surgeon: Daneil Dolin, MD;  [facility];  Service: Endoscopy;;  gastric ulcer biopsy   COLONOSCOPY     ESOPHAGOGASTRODUODENOSCOPY N/A 08/07/2016   LA Grade A esophagitis s/p dilation, small hiatal hernia, multiple gastric ulcers and erosions, duodenal erosions s/p biopsy. Negative H.pylori    ESOPHAGOGASTRODUODENOSCOPY N/A 11/27/2016   normal esophagus, previously noted gastric ulcers completely healed, normal duodenum.    ESOPHAGOGASTRODUODENOSCOPY (EGD) WITH PROPOFOL N/A 07/23/2020   Procedure: ESOPHAGOGASTRODUODENOSCOPY (EGD) WITH PROPOFOL;  Surgeon: Rogene Houston, MD;  [facility];  Service: Endoscopy;  Laterality: N/A;   FISTULA SUPERFICIALIZATION Left 02/14/2018   Procedure: FISTULA SUPERFICIALIZATION LEFT ARM;  Surgeon: Angelia Mould, MD;  Location: [facility] OR;  Service: Vascular;  Laterality: Left;   FRACTURE SURGERY Right    ankle   HOT HEMOSTASIS  07/23/2020   Procedure: HOT HEMOSTASIS (ARGON PLASMA COAGULATION/BICAP);  Surgeon: Rogene Houston, MD;  [facility];  Service: Endoscopy;;   INTRAMEDULLARY (IM) NAIL INTERTROCHANTERIC Right 07/12/2020   Procedure: INTRAMEDULLARY (IM) NAIL INTERTROCHANTRIC;  Surgeon: Mordecai Rasmussen, MD;  Location: [facility];  Service: Orthopedics;  Laterality: Right;   MALONEY DILATION N/A 08/07/2016   Procedure: Venia Minks DILATION;  Surgeon: Daneil Dolin, MD;  [facility];  Service: Endoscopy;  Laterality: N/A;   MASTECTOMY, PARTIAL Right    multiple myeloma      PERIPHERAL VASCULAR BALLOON ANGIOPLASTY Left 07/13/2019   Procedure: PERIPHERAL VASCULAR BALLOON ANGIOPLASTY;  Surgeon: Marty Heck, MD;  Location: [place] [facility];  Service: Cardiovascular;  Laterality: Left;  arm fistulogram   PERIPHERAL VASCULAR BALLOON ANGIOPLASTY Right 05/22/2020   Procedure: PERIPHERAL VASCULAR BALLOON ANGIOPLASTY;  Surgeon: Cherre Robins, MD;  Location: [place] [facility];  Service: Cardiovascular;  Laterality: Right;  arm fistula   PERIPHERAL VASCULAR BALLOON ANGIOPLASTY Right 07/05/2020   Procedure: PERIPHERAL VASCULAR BALLOON ANGIOPLASTY;  Surgeon: Elam Dutch, MD;  Location: [place] [facility];  Service: Cardiovascular;  Laterality: Right;  arm fistula   PORT-A-CATH REMOVAL Left 11/22/2017   Procedure: REMOVAL PORT-A-CATH LEFT CHEST;  Surgeon: Elam Dutch, MD;  Location: [place];  Service: Vascular;  Laterality: Left;   RETINAL DETACHMENT SURGERY Right    SCLEROTHERAPY  07/23/2020   Procedure: SCLEROTHERAPY;  Surgeon: Rogene Houston, MD;  [facility];  Service: Endoscopy;;   UPPER EXTREMITY VENOGRAPHY N/A 08/22/2020   Procedure: LEFT UPPER & CENTRAL VENOGRAPHY;  Surgeon: Marty Heck, MD;  Location: [place] [facility];  Service: Cardiovascular;  Laterality: N/A;    SOCIAL HISTORY:  Social History   Socioeconomic History   Marital status: Single    Spouse name: Not on file   Number of children: Not on file   Years of education: Not on file   Highest education level: Not on file  Occupational History   Occupation: retired   Tobacco Use   Smoking status: Never   Smokeless tobacco: Never  Vaping Use   Vaping Use: Never used  Substance and Sexual Activity   Alcohol use: No    Alcohol/week: 0.0 standard drinks   Drug use: No   Sexual activity: Never  Other Topics Concern   Not on file  Social History Narrative   Long term resident of SNF    Social Determinants of Health   Financial Resource Strain: Low Risk     Difficulty of Paying Living Expenses: Not very hard  Food Insecurity: No Food Insecurity   Worried About Charity fundraiser in the Last Year: Never true   [place] in the Last Year: Never true  Transportation Needs: No Transportation Needs   Lack of Transportation (Medical): No   Lack of Transportation (Non-Medical): No  Physical Activity: Inactive   Days of Exercise per Week: 0 days   Minutes of Exercise per Session: 0 min  Stress: No Stress Concern Present   Feeling of Stress : Not at all  Social Connections: Moderately Isolated   Frequency of Communication with Friends and Family: More than three times a week   Frequency of Social Gatherings with Friends and Family: Once a week   Attends Religious Services: More than 4 times per year   Active Member of [employer] or Organizations: No   Attends Music therapist: Never   Marital Status: Never married  Human resources officer Violence: Not At Risk   Fear of Current or Ex-Partner: No   Emotionally Abused: No   Physically Abused: No   Sexually Abused: No    FAMILY HISTORY:  Family History  Problem Relation Age of Onset   Multiple myeloma Sister    Brain cancer Sister    Dementia Mother        died at 33   Stroke Mother    Heart failure Mother    Diabetes Mother    Heart disease Father    Prostate cancer Brother    Colon cancer Neg Hx     CURRENT MEDICATIONS:  Current Outpatient Medications  Medication Sig Dispense Refill   anastrozole (ARIMIDEX) 1 MG tablet TAKE 1 TABLET BY MOUTH DAILY 30 tablet 6   No current facility-administered medications for this visit.    ALLERGIES:  Allergies  Allergen Reactions   Ace Inhibitors Other (See Comments) and Cough    Tongue swell , ie angioedema   Angiotensin Receptor Blockers     Angioedema with ACE-I   Penicillins Other (See Comments)    Unsure of reaction Has patient had a PCN reaction causing immediate rash, facial/tongue/throat swelling, SOB or lightheadedness  with hypotension: Unknown Has patient had a PCN reaction causing severe rash involving mucus membranes or skin necrosis: Unknown Has patient had a PCN reaction that required hospitalization: No Has patient had a PCN reaction occurring within the last 10 years: Unknown If all of the above answers are "NO", then may proceed with Cephalosporin use.     Penicillin G     PHYSICAL EXAM:  Performance status (ECOG):  2 - Symptomatic, <50% confined to bed  Vitals:   11/19/20 1051  BP: (!) 143/54  Pulse: 60  Resp: 18  Temp: (!) 96.8 F (36 C)  SpO2: 100%   Wt Readings from Last 3 Encounters:  11/19/20 133 lb 3.2 oz (60.4 kg)  11/07/20 138 lb 9.6 oz (62.9 kg)  11/05/20 133 lb 3.2 oz (60.4 kg)   Physical Exam Vitals reviewed.  Constitutional:      Appearance: Normal appearance.  Cardiovascular:     Rate and Rhythm: Normal rate and regular rhythm.     Pulses: Normal pulses.     Heart sounds: Normal heart sounds.  Pulmonary:     Effort: Pulmonary effort is normal.     Breath sounds: Normal breath sounds.  Neurological:     General: No focal deficit present.     Mental Status: She is alert and oriented to person, place, and time.  Psychiatric:        Mood and Affect: Mood normal.        Behavior: Behavior normal.    LABORATORY DATA:  I have reviewed the labs as listed.  CBC Latest Ref Rng & Units 11/19/2020 11/05/2020 10/29/2020  WBC 4.0 - 10.5 K/uL 10.9(H) 6.5 5.3  Hemoglobin 12.0 - 15.0 g/dL 11.3(L) 12.1 12.1  Hematocrit 36.0 - 46.0 % 37.2 39.1 40.0  Platelets 150 - 400 K/uL 242 213 218   CMP Latest Ref Rng & Units 11/19/2020 11/05/2020 10/29/2020  Glucose 70 - 99 mg/dL 106(H) 139(H) 190(H)  BUN 8 - 23 mg/dL 47(H) 49(H) 54(H)  Creatinine 0.44 - 1.00 mg/dL 6.58(H) 6.75(H) 7.35(H)  Sodium 135 - 145 mmol/L 133(L) 132(L) 134(L)  Potassium 3.5 - 5.1 mmol/L 3.9 4.2 4.0  Chloride 98 - 111 mmol/L 94(L) 95(L) 93(L)  CO2 22 - 32 mmol/L _0 Calcium 8.9 - 10.3 mg/dL 9.0 8.9 9.4   Total Protein 6.5 - 8.1 g/dL 6.5 6.8 6.5  Total Bilirubin 0.3 - 1.2 mg/dL 0.6 0.4 0.4  Alkaline Phos 38 - 126 U/L 108 104 98  AST 15 - 41 U/L _1 ALT 0 - 44 U/L _2 DIAGNOSTIC IMAGING:  I have independently reviewed the scans and discussed with the patient. No results found.   ASSESSMENT:  1.  IgA lambda plasma cell myeloma, stage II, standard risk: -BMBX on 09/20/2018 shows plasma cell myeloma, 30% plasma cells.  FISH panel was normal.  Chromosome analysis normal. -Labs at diagnosis on 08/29/2018 with M spike 0.9 g.  Kappa light chains 148, lambda light chains 1132, ratio 0.13.  LDH normal.  Beta-2 microglobulin 13.5.  She had transfusion dependent anemia. -Velcade and dexamethasone started on 10/11/2018. -Myeloma panel on 07/11/2019 shows SPEP is negative.  Free light chain ratio improved to 1.09.  Lambda light chains are 128, improved from 141.  Kappa light chains are 140. -She has [facility] at [facility] since 10/12/[facility].   2.  Stage I IDC of the right breast, ER/PR positive, HER-2 negative: -She is on anastrozole.   PLAN:  1.  IgA lambda plasma cell myeloma: - She is currently receiving Velcade 3 weeks on/1 week off with low-dose dexamethasone 10 mg on days of Velcade. - She denies any tingling or numbness in the extremities.  Denies any diarrhea. - We reviewed myeloma labs from 10/07/2020.  SPEP was negative.  Immunofixation shows IgA lambda.  Free light chain ratio is normal around 0.6. - At this point  I would recommend changing Velcade to every 2 weeks as a maintenance treatment. - RTC 6 weeks for follow-up with repeat labs.   2.  ESRD on HD: - Continue HD on Monday, Wednesday and Friday.   3.  Bone strengthening: - Bisphosphonates were not started due to patient being on dialysis.   4.  Osteopenia: -Last bone density in 2017 showed osteopenia. - We will repeat bone density test prior to next visit in 6 weeks.   5.  Right breast cancer: - Continue  anastrozole.   Orders placed this encounter:  No orders of the defined types were placed in this encounter.    Derek Jack, MD [place] [phone]   I, Thana Ates, am acting as a scribe for Dr. Derek Jack.  I, Derek Jack MD, have reviewed the above documentation for accuracy and completeness, and I agree with the above.

## 2020-11-19 ENCOUNTER — Inpatient Hospital Stay (HOSPITAL_COMMUNITY): Payer: Medicare PPO

## 2020-11-19 ENCOUNTER — Inpatient Hospital Stay (HOSPITAL_BASED_OUTPATIENT_CLINIC_OR_DEPARTMENT_OTHER): Payer: Medicare PPO | Admitting: Hematology

## 2020-11-19 VITALS — BP 143/54 | HR 60 | Temp 96.8°F | Resp 18 | Wt 133.2 lb

## 2020-11-19 DIAGNOSIS — Z5111 Encounter for antineoplastic chemotherapy: Secondary | ICD-10-CM | POA: Diagnosis not present

## 2020-11-19 DIAGNOSIS — Z79811 Long term (current) use of aromatase inhibitors: Secondary | ICD-10-CM | POA: Diagnosis not present

## 2020-11-19 DIAGNOSIS — E1122 Type 2 diabetes mellitus with diabetic chronic kidney disease: Secondary | ICD-10-CM | POA: Diagnosis not present

## 2020-11-19 DIAGNOSIS — E785 Hyperlipidemia, unspecified: Secondary | ICD-10-CM | POA: Diagnosis not present

## 2020-11-19 DIAGNOSIS — C9000 Multiple myeloma not having achieved remission: Secondary | ICD-10-CM

## 2020-11-19 DIAGNOSIS — N184 Chronic kidney disease, stage 4 (severe): Secondary | ICD-10-CM | POA: Diagnosis not present

## 2020-11-19 DIAGNOSIS — N186 End stage renal disease: Secondary | ICD-10-CM | POA: Diagnosis not present

## 2020-11-19 DIAGNOSIS — M8580 Other specified disorders of bone density and structure, unspecified site: Secondary | ICD-10-CM | POA: Diagnosis not present

## 2020-11-19 DIAGNOSIS — C50919 Malignant neoplasm of unspecified site of unspecified female breast: Secondary | ICD-10-CM | POA: Diagnosis not present

## 2020-11-19 DIAGNOSIS — I120 Hypertensive chronic kidney disease with stage 5 chronic kidney disease or end stage renal disease: Secondary | ICD-10-CM | POA: Diagnosis not present

## 2020-11-19 LAB — COMPREHENSIVE METABOLIC PANEL
ALT: 13 U/L (ref 0–44)
AST: 17 U/L (ref 15–41)
Albumin: 3.5 g/dL (ref 3.5–5.0)
Alkaline Phosphatase: 108 U/L (ref 38–126)
Anion gap: 12 (ref 5–15)
BUN: 47 mg/dL — ABNORMAL HIGH (ref 8–23)
CO2: 27 mmol/L (ref 22–32)
Calcium: 9 mg/dL (ref 8.9–10.3)
Chloride: 94 mmol/L — ABNORMAL LOW (ref 98–111)
Creatinine, Ser: 6.58 mg/dL — ABNORMAL HIGH (ref 0.44–1.00)
GFR, Estimated: 6 mL/min — ABNORMAL LOW (ref >60)
Glucose, Bld: 106 mg/dL — ABNORMAL HIGH (ref 70–99)
Potassium: 3.9 mmol/L (ref 3.5–5.1)
Sodium: 133 mmol/L — ABNORMAL LOW (ref 135–145)
Total Bilirubin: 0.6 mg/dL (ref 0.3–1.2)
Total Protein: 6.5 g/dL (ref 6.5–8.1)

## 2020-11-19 LAB — CBC WITH DIFFERENTIAL/PLATELET
Abs Immature Granulocytes: 0.04 10*3/uL (ref 0.00–0.07)
Basophils Absolute: 0 10*3/uL (ref 0.0–0.1)
Basophils Relative: 0 %
Eosinophils Absolute: 0.2 10*3/uL (ref 0.0–0.5)
Eosinophils Relative: 2 %
HCT: 37.2 % (ref 36.0–46.0)
Hemoglobin: 11.3 g/dL — ABNORMAL LOW (ref 12.0–15.0)
Immature Granulocytes: 0 %
Lymphocytes Relative: 9 %
Lymphs Abs: 1 10*3/uL (ref 0.7–4.0)
MCH: 29.4 pg (ref 26.0–34.0)
MCHC: 30.4 g/dL (ref 30.0–36.0)
MCV: 96.6 fL (ref 80.0–100.0)
Monocytes Absolute: 0.8 10*3/uL (ref 0.1–1.0)
Monocytes Relative: 8 %
Neutro Abs: 8.8 10*3/uL — ABNORMAL HIGH (ref 1.7–7.7)
Neutrophils Relative %: 81 %
Platelets: 242 10*3/uL (ref 150–400)
RBC: 3.85 MIL/uL — ABNORMAL LOW (ref 3.87–5.11)
RDW: 17.4 % — ABNORMAL HIGH (ref 11.5–15.5)
WBC: 10.9 10*3/uL — ABNORMAL HIGH (ref 4.0–10.5)
nRBC: 0 % (ref 0.0–0.2)

## 2020-11-19 MED ORDER — DEXAMETHASONE 4 MG PO TABS
10.0000 mg | ORAL_TABLET | Freq: Once | ORAL | Status: AC
  Administered 2020-11-19: 10 mg via ORAL
  Filled 2020-11-19: qty 3

## 2020-11-19 MED ORDER — BORTEZOMIB CHEMO SQ INJECTION 3.5 MG (2.5MG/ML)
1.3000 mg/m2 | Freq: Once | INTRAMUSCULAR | Status: AC
  Administered 2020-11-19: 2.25 mg via SUBCUTANEOUS
  Filled 2020-11-19: qty 0.9

## 2020-11-19 MED ORDER — PROCHLORPERAZINE MALEATE 10 MG PO TABS
10.0000 mg | ORAL_TABLET | Freq: Once | ORAL | Status: AC
  Administered 2020-11-19: 10 mg via ORAL
  Filled 2020-11-19: qty 1

## 2020-11-19 NOTE — Progress Notes (Signed)
Patient tolerated Velcade injection with no complaints voiced. Lab work reviewed. See MAR for details. Injection site clean and dry with no bruising or swelling noted. Patient stable during and after injection. Band aid applied. VSS. Patient left in satisfactory condition via wheelchair to the [facility] with no s/s of distress noted.

## 2020-11-19 NOTE — Progress Notes (Signed)
Patient has been examined, vital signs and labs have been reviewed by Dr. Katragadda. ANC, Creatinine, LFTs, hemoglobin, and platelets are within treatment parameters per Dr. Katragadda. Patient is okay to proceed with treatment per M.D.   

## 2020-11-19 NOTE — Patient Instructions (Signed)
[place]  Discharge Instructions: Thank you for choosing [place] to provide your oncology and hematology care.  If you have a lab appointment with the [facility], please come in thru the Main Entrance and check in at the main information desk.  Wear comfortable clothing and clothing appropriate for easy access to any Portacath or PICC line.   We strive to give you quality time with your provider. You may need to reschedule your appointment if you arrive late (15 or more minutes).  Arriving late affects you and other patients whose appointments are after yours.  Also, if you miss three or more appointments without notifying the office, you may be dismissed from the clinic at the provider's discretion.      For prescription refill requests, have your pharmacy contact our office and allow 72 hours for refills to be completed.    Today you received the following chemotherapy and/or immunotherapy agents: Velcade. Return as scheduled.   To help prevent nausea and vomiting after your treatment, we encourage you to take your nausea medication as directed.  BELOW ARE SYMPTOMS THAT SHOULD BE REPORTED IMMEDIATELY: *FEVER GREATER THAN 100.4 F (38 C) OR HIGHER *CHILLS OR SWEATING *NAUSEA AND VOMITING THAT IS NOT CONTROLLED WITH YOUR NAUSEA MEDICATION *UNUSUAL SHORTNESS OF BREATH *UNUSUAL BRUISING OR BLEEDING *URINARY PROBLEMS (pain or burning when urinating, or frequent urination) *BOWEL PROBLEMS (unusual diarrhea, constipation, pain near the anus) TENDERNESS IN MOUTH AND THROAT WITH OR WITHOUT PRESENCE OF ULCERS (sore throat, sores in mouth, or a toothache) UNUSUAL RASH, SWELLING OR PAIN  UNUSUAL VAGINAL DISCHARGE OR ITCHING   Items with * indicate a potential emergency and should be followed up as soon as possible or go to the Emergency Department if any problems should occur.  Please show the CHEMOTHERAPY ALERT CARD or IMMUNOTHERAPY ALERT CARD at check-in to  the Emergency Department and triage nurse.  Should you have questions after your visit or need to cancel or reschedule your appointment, please contact [facility] [phone]  and follow the prompts.  Office hours are 8:00 a.m. to 4:30 p.m. Monday - Friday. Please note that voicemails left after 4:00 p.m. may not be returned until the following business day.  We are closed weekends and major holidays. You have access to a nurse at all times for urgent questions. Please call the main number to the clinic [phone] and follow the prompts.  For any non-urgent questions, you may also contact your provider using MyChart. We now offer e-Visits for anyone 38 and older to request care online for non-urgent symptoms. For details visit [email or web address].   Also download the MyChart app! Go to the app store, search "MyChart", open the app, select [place], and log in with your MyChart username and password.  Due to Covid, a mask is required upon entering the hospital/clinic. If you do not have a mask, one will be given to you upon arrival. For doctor visits, patients may have 1 support person aged 72 or older with them. For treatment visits, patients cannot have anyone with them due to current Covid guidelines and our immunocompromised population.

## 2020-11-19 NOTE — Patient Instructions (Addendum)
[place] at [facility] Discharge Instructions  You were seen today by Dr. Delton Coombes. He went over your recent results, and you received your treatment. You will be scheduled for a bone density scan prior to your next appointment. Dr. Delton Coombes will see you back in 6 weeks for labs and follow up.   Thank you for choosing [place] at [facility] to provide your oncology and hematology care.  To afford each patient quality time with our provider, please arrive at least 15 minutes before your scheduled appointment time.   If you have a lab appointment with the [facility] please come in thru the Main Entrance and check in at the main information desk  You need to re-schedule your appointment should you arrive 10 or more minutes late.  We strive to give you quality time with our providers, and arriving late affects you and other patients whose appointments are after yours.  Also, if you no show three or more times for appointments you may be dismissed from the clinic at the providers discretion.     Again, thank you for choosing [facility].  Our hope is that these requests will decrease the amount of time that you wait before being seen by our physicians.       _____________________________________________________________  Should you have questions after your visit to [facility], please contact our office at [phone] between the hours of 8:00 a.m. and 4:30 p.m.  Voicemails left after 4:00 p.m. will not be returned until the following business day.  For prescription refill requests, have your pharmacy contact our office and allow 72 hours.    Cancer Center Support Programs:   > Cancer Support Group  2nd Tuesday of the month 1pm-2pm, Journey Room

## 2020-11-20 DIAGNOSIS — Z992 Dependence on renal dialysis: Secondary | ICD-10-CM | POA: Diagnosis not present

## 2020-11-20 DIAGNOSIS — N186 End stage renal disease: Secondary | ICD-10-CM | POA: Diagnosis not present

## 2020-11-20 LAB — KAPPA/LAMBDA LIGHT CHAINS
Kappa free light chain: 158.3 mg/L — ABNORMAL HIGH (ref 3.3–19.4)
Kappa, lambda light chain ratio: 0.63 (ref 0.26–1.65)
Lambda free light chains: 249.6 mg/L — ABNORMAL HIGH (ref 5.7–26.3)

## 2020-11-21 LAB — IMMUNOFIXATION ELECTROPHORESIS
IgA: 207 mg/dL (ref 64–422)
IgG (Immunoglobin G), Serum: 602 mg/dL (ref 586–1602)
IgM (Immunoglobulin M), Srm: 11 mg/dL — ABNORMAL LOW (ref 26–217)
Total Protein ELP: 6.1 g/dL (ref 6.0–8.5)

## 2020-11-22 DIAGNOSIS — Z992 Dependence on renal dialysis: Secondary | ICD-10-CM | POA: Diagnosis not present

## 2020-11-22 DIAGNOSIS — N186 End stage renal disease: Secondary | ICD-10-CM | POA: Diagnosis not present

## 2020-11-25 ENCOUNTER — Other Ambulatory Visit (HOSPITAL_COMMUNITY): Payer: Medicare PPO

## 2020-11-25 DIAGNOSIS — Z992 Dependence on renal dialysis: Secondary | ICD-10-CM | POA: Diagnosis not present

## 2020-11-25 DIAGNOSIS — N186 End stage renal disease: Secondary | ICD-10-CM | POA: Diagnosis not present

## 2020-11-26 ENCOUNTER — Other Ambulatory Visit (HOSPITAL_COMMUNITY): Payer: Medicare PPO

## 2020-11-26 ENCOUNTER — Ambulatory Visit (HOSPITAL_COMMUNITY): Payer: Medicare PPO

## 2020-11-26 LAB — PROTEIN ELECTROPHORESIS, SERUM
A/G Ratio: 1.5 (ref 0.7–1.7)
Albumin ELP: 3.5 g/dL (ref 2.9–4.4)
Alpha-1-Globulin: 0.3 g/dL (ref 0.0–0.4)
Alpha-2-Globulin: 0.8 g/dL (ref 0.4–1.0)
Beta Globulin: 0.9 g/dL (ref 0.7–1.3)
Gamma Globulin: 0.4 g/dL (ref 0.4–1.8)
Globulin, Total: 2.4 g/dL (ref 2.2–3.9)
Total Protein ELP: 5.9 g/dL — ABNORMAL LOW (ref 6.0–8.5)

## 2020-11-27 ENCOUNTER — Non-Acute Institutional Stay (SKILLED_NURSING_FACILITY): Payer: Medicare PPO | Admitting: Adult Health

## 2020-11-27 ENCOUNTER — Encounter: Payer: Self-pay | Admitting: Adult Health

## 2020-11-27 DIAGNOSIS — Z9011 Acquired absence of right breast and nipple: Secondary | ICD-10-CM

## 2020-11-27 DIAGNOSIS — N186 End stage renal disease: Secondary | ICD-10-CM

## 2020-11-27 DIAGNOSIS — K219 Gastro-esophageal reflux disease without esophagitis: Secondary | ICD-10-CM

## 2020-11-27 DIAGNOSIS — E785 Hyperlipidemia, unspecified: Secondary | ICD-10-CM

## 2020-11-27 DIAGNOSIS — Z992 Dependence on renal dialysis: Secondary | ICD-10-CM | POA: Diagnosis not present

## 2020-11-27 DIAGNOSIS — D631 Anemia in chronic kidney disease: Secondary | ICD-10-CM

## 2020-11-27 DIAGNOSIS — S72001D Fracture of unspecified part of neck of right femur, subsequent encounter for closed fracture with routine healing: Secondary | ICD-10-CM | POA: Diagnosis not present

## 2020-11-27 DIAGNOSIS — E1169 Type 2 diabetes mellitus with other specified complication: Secondary | ICD-10-CM | POA: Diagnosis not present

## 2020-11-27 DIAGNOSIS — C50911 Malignant neoplasm of unspecified site of right female breast: Secondary | ICD-10-CM

## 2020-11-27 DIAGNOSIS — E1122 Type 2 diabetes mellitus with diabetic chronic kidney disease: Secondary | ICD-10-CM | POA: Diagnosis not present

## 2020-11-27 DIAGNOSIS — I70209 Unspecified atherosclerosis of native arteries of extremities, unspecified extremity: Secondary | ICD-10-CM

## 2020-11-27 DIAGNOSIS — K259 Gastric ulcer, unspecified as acute or chronic, without hemorrhage or perforation: Secondary | ICD-10-CM

## 2020-11-27 DIAGNOSIS — F01518 Vascular dementia, unspecified severity, with other behavioral disturbance: Secondary | ICD-10-CM

## 2020-11-27 DIAGNOSIS — F0151 Vascular dementia with behavioral disturbance: Secondary | ICD-10-CM | POA: Diagnosis not present

## 2020-11-27 DIAGNOSIS — I120 Hypertensive chronic kidney disease with stage 5 chronic kidney disease or end stage renal disease: Secondary | ICD-10-CM

## 2020-11-27 DIAGNOSIS — C9000 Multiple myeloma not having achieved remission: Secondary | ICD-10-CM

## 2020-11-27 DIAGNOSIS — I700 Atherosclerosis of aorta: Secondary | ICD-10-CM | POA: Diagnosis not present

## 2020-11-27 DIAGNOSIS — F339 Major depressive disorder, recurrent, unspecified: Secondary | ICD-10-CM

## 2020-11-27 NOTE — Progress Notes (Signed)
Provider:Tayleigh Wetherell [name] NP  [facility]   PCP: Gerlene Fee, NP   Extended Emergency Contact Information Primary Emergency Contact: Luallen,Annie Address: [address]          [place], [place] [address] [place] Phone: [phone] Relation: Sister Secondary Emergency Contact: [place] Phone: [phone] Relation: Sister  Codes status: DNR Goals of care: advanced directive information Advanced Directives 11/19/2020  Does Patient Have a Medical Advance Directive? Yes  Type of Paramedic of [place];Living will  Does patient want to make changes to medical advance directive? -  Copy of [place] in Chart? No - copy requested  Would patient like information on creating a medical advance directive? No - Patient declined  Pre-existing out of facility DNR order (yellow form or pink MOST form) -     Allergies  Allergen Reactions   Ace Inhibitors Other (See Comments) and Cough    Tongue swell , ie angioedema   Angiotensin Receptor Blockers     Angioedema with ACE-I   Penicillins Other (See Comments)    Unsure of reaction Has patient had a PCN reaction causing immediate rash, facial/tongue/throat swelling, SOB or lightheadedness with hypotension: Unknown Has patient had a PCN reaction causing severe rash involving mucus membranes or skin necrosis: Unknown Has patient had a PCN reaction that required hospitalization: No Has patient had a PCN reaction occurring within the last 10 years: Unknown If all of the above answers are "NO", then may proceed with Cephalosporin use.     Penicillin G     Chief Complaint  Patient presents with   Annual Exam    HPI  She is a 85 year old long term resident of this facility being seen for her annual exam. She has had numerous hospitalizations this past year. January had a right arm fistula placed; was not successful. In April 2022: had a  left IJ dialysis access placed. 07-17-20 through 07-17-20: right hip fracture with IM nail. 07-22-20 through 07-26-20 for upper GIB with PUD gastric and duodenal ulcerations. She continues with treatment for her multiple myeloma with oncology. She continues with hemodialysis three days per week. She continues to be followed for her chronic illnesses including:    Chronic kidney disease with end stage renal disease on dialysis due to type 2 diabetes mellitus/dependence on dialysis: Hypertension due to end stage renal disease (ESRD) due to type 2 diabetes mellitus:. Hyperlipidemia associated with type 2 diabetes mellitus    Past Medical History:  Diagnosis Date   Anemia     chronic macrocytic anemia   Anxiety    Chronic kidney disease    Chronic renal disease, stage 4, severely decreased glomerular filtration rate (GFR) between 15-29 mL/min/1.73 square meter ([place]) 69/79/4801   Complication of anesthesia    delirious after Breast Surgery   Dementia ([place])    mild   Depression    Diabetes mellitus with ESRD (end-stage renal disease) ([place])    type II   Dysphagia    Dyspnea    with activity   GERD (gastroesophageal reflux disease)    Glaucoma    Hyperlipidemia    Hypertension    Pneumonia    Stage 1 infiltrating ductal carcinoma of right female breast ([place]) 08/21/2015   ER+ PR+ HER 2 neu + (3+) T1cN0    Past Surgical History:  Procedure Laterality Date   A/V FISTULAGRAM Right 07/05/2020   Procedure: A/V Fistulagram;  Surgeon: Elam Dutch, MD;  Location: [facility];  Service: Cardiovascular;  Laterality: Right;   AV FISTULA PLACEMENT Left 11/22/2017   Procedure: ARTERIOVENOUS (AV) FISTULA CREATION LEFT ARM;  Surgeon: Elam Dutch, MD;  Location: [facility] OR;  Service: Vascular;  Laterality: Left;   AV FISTULA PLACEMENT Right 04/04/2020   Procedure: RIGHT ARM ARTERIOVENOUS FISTULA CREATION;  Surgeon: Rosetta Posner, MD;  [facility];  Service: Vascular;  Laterality: Right;   AV  FISTULA PLACEMENT Right 08/20/2020   Procedure: ARTERIOVENOUS (AV) FISTULA LIGATION RIGHT ARM;  Surgeon: Elam Dutch, MD;  Location: [facility] OR;  Service: Vascular;  Laterality: Right;   BIOPSY  08/07/2016   Procedure: BIOPSY;  Surgeon: Daneil Dolin, MD;  [facility];  Service: Endoscopy;;  gastric ulcer biopsy   COLONOSCOPY     ESOPHAGOGASTRODUODENOSCOPY N/A 08/07/2016   LA Grade A esophagitis s/p dilation, small hiatal hernia, multiple gastric ulcers and erosions, duodenal erosions s/p biopsy. Negative H.pylori    ESOPHAGOGASTRODUODENOSCOPY N/A 11/27/2016   normal esophagus, previously noted gastric ulcers completely healed, normal duodenum.    ESOPHAGOGASTRODUODENOSCOPY (EGD) WITH PROPOFOL N/A 07/23/2020   Procedure: ESOPHAGOGASTRODUODENOSCOPY (EGD) WITH PROPOFOL;  Surgeon: Rogene Houston, MD;  [facility];  Service: Endoscopy;  Laterality: N/A;   FISTULA SUPERFICIALIZATION Left 02/14/2018   Procedure: FISTULA SUPERFICIALIZATION LEFT ARM;  Surgeon: Angelia Mould, MD;  Location: [facility] OR;  Service: Vascular;  Laterality: Left;   FRACTURE SURGERY Right    ankle   HOT HEMOSTASIS  07/23/2020   Procedure: HOT HEMOSTASIS (ARGON PLASMA COAGULATION/BICAP);  Surgeon: Rogene Houston, MD;  [facility];  Service: Endoscopy;;   INTRAMEDULLARY (IM) NAIL INTERTROCHANTERIC Right 07/12/2020   Procedure: INTRAMEDULLARY (IM) NAIL INTERTROCHANTRIC;  Surgeon: Mordecai Rasmussen, MD;  [facility];  Service: Orthopedics;  Laterality: Right;   MALONEY DILATION N/A 08/07/2016   Procedure: Venia Minks DILATION;  Surgeon: Daneil Dolin, MD;  [facility];  Service: Endoscopy;  Laterality: N/A;   MASTECTOMY, PARTIAL Right    multiple myeloma     PERIPHERAL VASCULAR BALLOON ANGIOPLASTY Left 07/13/2019   Procedure: PERIPHERAL VASCULAR BALLOON ANGIOPLASTY;  Surgeon: Marty Heck, MD;  Location: [facility];  Service: Cardiovascular;  Laterality: Left;  arm  fistulogram   PERIPHERAL VASCULAR BALLOON ANGIOPLASTY Right 05/22/2020   Procedure: PERIPHERAL VASCULAR BALLOON ANGIOPLASTY;  Surgeon: Cherre Robins, MD;  Location: [facility];  Service: Cardiovascular;  Laterality: Right;  arm fistula   PERIPHERAL VASCULAR BALLOON ANGIOPLASTY Right 07/05/2020   Procedure: PERIPHERAL VASCULAR BALLOON ANGIOPLASTY;  Surgeon: Elam Dutch, MD;  Location: [facility];  Service: Cardiovascular;  Laterality: Right;  arm fistula   PORT-A-CATH REMOVAL Left 11/22/2017   Procedure: REMOVAL PORT-A-CATH LEFT CHEST;  Surgeon: Elam Dutch, MD;  Location: [facility];  Service: Vascular;  Laterality: Left;   RETINAL DETACHMENT SURGERY Right    SCLEROTHERAPY  07/23/2020   Procedure: SCLEROTHERAPY;  Surgeon: Rogene Houston, MD;  [facility];  Service: Endoscopy;;   UPPER EXTREMITY VENOGRAPHY N/A 08/22/2020   Procedure: LEFT UPPER & CENTRAL VENOGRAPHY;  Surgeon: Marty Heck, MD;  Location: [facility];  Service: Cardiovascular;  Laterality: N/A;    reports that she has never smoked. She has never used smokeless tobacco. She reports that she does not drink alcohol and does not use drugs. Social History   Tobacco Use   Smoking status: Never  Smokeless tobacco: Never  Vaping Use   Vaping Use: Never used  Substance Use Topics   Alcohol use: No    Alcohol/week: 0.0 standard drinks   Drug use: No   Family History  Problem Relation Age of Onset   Multiple myeloma Sister    Brain cancer Sister    Dementia Mother        died at 32   Stroke Mother    Heart failure Mother    Diabetes Mother    Heart disease Father    Prostate cancer Brother    Colon cancer Neg Hx     Pertinent  Health Maintenance Due  Topic Date Due   OPHTHALMOLOGY EXAM  Never done   FOOT EXAM  11/02/2020   INFLUENZA VACCINE  12/06/2020 (Originally 10/07/2020)   HEMOGLOBIN A1C  12/13/2020   DEXA SCAN  Completed   URINE MICROALBUMIN  Discontinued    Fall Risk  11/08/2019  Falls in the past year? 1  Number falls in past yr: 0  Injury with Fall? 0   Depression screen [facility] 2/9 01/09/2020 11/08/2019  Decreased Interest 0 0  Down, Depressed, Hopeless 0 0  PHQ - 2 Score 0 0  Some recent data might be hidden    Functional Status Survey:    Outpatient Encounter Medications as of 11/27/2020  Medication Sig   acetaminophen (TYLENOL) 650 MG CR tablet Take 650 mg by mouth every 8 hours    acyclovir (ZOVIRAX) 200 MG capsule Take 200 mg by mouth in the morning. (0800)   Amino Acids-Protein Hydrolys (FEEDING SUPPLEMENT, PRO-STAT SUGAR FREE 64,) LIQD Take 30 mLs by mouth 3 (three) times daily with meals. (0800, 1200 & 1800)   amLODipine (NORVASC) 10 MG tablet    anastrozole (ARIMIDEX) 1 MG tablet TAKE 1 TABLET BY MOUTH DAILY   atenolol (TENORMIN) 25 MG tablet Take 1 tablet (25 mg total) by mouth in the morning. (0800)   atorvastatin (LIPITOR) 10 MG tablet Take 10 mg by mouth at bedtime. (2000)   Balsam [place]-Castor Oil (VENELEX) OINT Apply topically. Apply to bilateral buttocks and sacrum every shift.   BORTEZOMIB IJ Inject as directed once a week.   Calcium Acetate 667 MG TABS Take 667-1,334 mg by mouth See admin instructions. Take 2 tablets (1334 mg) by mouth with each meal (0800 &1730) and take 1 tablet (667 mg) by mouth with each snack (2000)   calcium-vitamin D (OSCAL WITH D) 500-200 MG-UNIT tablet Take 1 tablet by mouth in the morning. (0800)   cloNIDine (CATAPRES) 0.1 MG tablet Take 1 tablet (0.1 mg total) by mouth at bedtime. (2000) Hold for SBP < 110 bpm   insulin glargine (LANTUS SOLOSTAR) 100 UNIT/ML Solostar Pen Inject 10 Units into the skin at bedtime. (2000)   insulin lispro (HUMALOG) 100 UNIT/ML injection Inject 5 Units into the skin 3 (three) times daily with meals. (0800, 1200 & 1800) Give for CBG>150.   melatonin 3 MG TABS tablet Take 6 mg by mouth at bedtime. (2000) For Sleep   multivitamin (RENA-VIT) TABS tablet Take 1 tablet by  mouth at bedtime. (2100)   NON FORMULARY Diet-Regular Diet with thin liquids   Nutritional Supplements (FEEDING SUPPLEMENT, NEPRO CARB STEADY,) LIQD Take 237 mLs by mouth at bedtime. (2000)   ondansetron (ZOFRAN) 4 MG tablet Take 1 tablet (4 mg total) by mouth every 6 (six) hours as needed for nausea.   pantoprazole (PROTONIX) 40 MG tablet Take by mouth.twice daily    sertraline (ZOLOFT)  50 MG tablet Take 50 mg by mouth in the morning. (0800)   sucralfate (CARAFATE) 1 GM/10ML suspension Take 1 g by mouth in the morning, at noon, in the evening, and at bedtime. (0800, 1200, 1700 & 2100)   No facility-administered encounter medications on file as of 11/27/2020.     Vitals:   11/27/20 1136  BP: 138/66  Pulse: 76  Temp: (!) 97.5 F (36.4 C)  Weight: 143 lb 9.6 oz (65.1 kg)  Height: '5\' 3"'  (1.6 m)   Body mass index is 25.44 kg/m.  DIAGNOSTIC EXAMS   PREVIOUS   07-11-20: chest x-ray:  Central catheter tip at cavoatrial junction. No pneumothorax. Mild left base atelectasis. No edema or airspace opacity. Stable cardiac silhouette. Aortic Atherosclerosis   07-11-20: right knee x-ray:  No acute fracture or dislocation is noted. Diffuse vascular calcifications are seen. No soft tissue abnormality is noted.  07-11-20: right hip x-ray:  Acute, impacted, angulated right basicervical femoral neck fracture.   07-23-20: upper endoscopy:  Normal hypopharynx. - Normal esophagus. - Z-line irregular, 34 cm from the incisors. - Non-bleeding gastric ulcers with no stigmata of bleeding. - Duodenal ulcer with stigmata of bleed/ pigmented material. Gold probe therapy was not successful and hemostasis achieved with injection of dilute epinephrine. - Non-bleeding duodenal ulcer with no stigmata of bleeding(second bulbar ulcer) - Normal second portion of the duodenum. - No specimens collected.  07-23-20: chest x-ray:No active disease. Aortic Atherosclerosis    NO NEW EXAMS.     LABS REVIEWED  PREVIOUS  11-14-19; wbc 8.3; hgb 9.7; hct 31.2 mcv 116.4 plt 261; glucose 198; bun 44; creat 6.66; k+ 5.1; na++ 138; ca 8.8 liver normal albumin 3.9   12-12-19: wbc 7.6; hgb 11.3; hct 35.6; mcv 109.9 plt 212; glucose 602; bun 67; creat 8.62; k+ 5.6; na++ 135; ca 7.9 liver normal albumin 3.5  12-20-19: hgb a1c 8.0  01-09-20: wbc 6.9; hgb 12.1; hct 39.7; mcv 105.6 plt 299; glucose 225; bun 112; creat 14.53; k+ 6.4; na++ 132; ca 8.3 liver normal albumin 3.5  01-23-20: k+ 6.6  01-23-20: wbc 7.9; hgb 11.6; hct 36.9; mcv 103.7 plt 359; glucose 139; bun 109; creat 15.46; k+ 6.1; na++ 140; ca 8.7 liver normal albumin 4.1;  01-24-20: k+ 4.4  01-30-20: wbc 10.6; hgb 9.7; hct 32.0; mcv 103.6 plt 390; glucose 164; bun 44; creat 9.08; k+ 4.1; na++ 139; ca 9.0 liver normal albumin 3.3 02-06-20: wbc 7.8; hgb 9.9; hct 32.7 mcv 104.8 plt 344; glucose 211; bun 90; creat 10.97; k+ 6.0 na++ 137; ca 8.8 liver normal albumin 3.4  03-26-20: wbc 6.3; hgb 9.4; hct 30.4; mcv 104.1; plt 300; glucose 223; bun 101; creat 14.22; k+ 5.8; na++ 135; ca 8.1 GFR 2; liver normal albumin 3.4  05-21-20; wbc 5.1; hgb 10.4; hct 32.8; mcv 106.8 plt 303; glucose 186; bun 41; creat 1.54; k+ 5.0; na++ 132; ca 8.4 GFR 5; liver normal albumin 3.7 06-13-20: chol 189; ldl 106; trig 211; hdl 41; hgb a1c 6.2   07-11-20: wbc 9.1; hgb 10.6; hct 34.6; mcv 109.1 plt 248; glucose 79; bun 45; creat 7.71 k+ 4.9; na++ 134; ca 8.1 GFR 5; liver normal albumin 3.5 mag 2.4 07-13-20: wbc 11.4; hgb 8.9; hct 29.7; mcv 108.8 plt 303; glucose 178; bun 66; creat 11.17; k+ 6.6; na++ 135; ca 8.2; GFR 3 07-15-20: wbc 11.0; hgb 7.5; hct 25.2; mcv 108.2 plt 251; glucose 117; bun 60; creat 9.41; k+ 5.5; na++ 138; ca 8.4 GFR 4 07-17-20:  wbc 11.5; hgb 9.4; hct 29.8; mcv 100.3 plt 300; glucose 140; bun 68; creat 8.04; k+ 4.2; na++ 135; ca 8.5 GFR 5 07-22-20: wbc 11.6; hgb 7.1; hct 23.5; mcv 104.0 plt 444; glucose 214; bun 97; creat 9.18; k+ 4.5; na++ 140; ca 8.0; GFR4 07-24-20: wbc 14.3;  hgb 6.7; hct 21.2; mcv 99.5 plt 385; glucose 96; bun 49; creat 6.55; k+ 4.7; na++ 133; ca 8.0; GFR4; mag 2.0 phos 3.8 07-26-20: wbc 10.4; hgb 8.5; hct 26.8; mcv 98.9 plt 430; glucose 111; bun 40;creat 6.90; k+ 4.2; na++ 133; ca 8.5; GFR 6; mag 2.2; phos 3.3    07-30-20: wbc 7.6; hgb 7.3; hct 24.1; mcv 103.4 plt 345; glucose 97; bun 26; creat 5.89; k+ 4.1; na++ 139; ca 8.6; GFR 7; liver normal albumin 2.8.   TODAY  09-03-20: wbc 5.8; hgb 11.2; hct 37.3; mcv 102.8 plt 274; glucose 175; bun 38; creat 5.72; k+ 3.1; na++ 138; ca 9.2 GFR 7 alk phos 138; albumin 3.4 mag 2.0 09-30-20: wbc 5.6; hgb 13.1; hct 42.6; mcv 97.9 plt 260; glucose 92; bun 23; creat 4.18; k+ 3.1; na++ 134; ca 8.7; GFR 10; liver normal albumin 3.8 10-29-20: wbc 5.3; hgb 12.1; hct 40.0; mcv 96.6 plt 218; glucose 190; bun 54; creat 7.35 k+ 4.0; na++ 134; ca 9.4; GFR 5 liver normal albumin 3.7; mag 2.3 11-19-20: wbc 10.9; hgb 11.3; hct 37.2; mcv 96.6 plt 242; glucose 106; bun 47; creat 6.58; k+ 3.9; na++ 133; ca 9.0; GFR 6 liver normal albumin 3.5   Review of Systems  Constitutional:  Negative for malaise/fatigue.  Respiratory:  Negative for cough and shortness of breath.   Cardiovascular:  Negative for chest pain, palpitations and leg swelling.  Gastrointestinal:  Negative for abdominal pain, constipation and heartburn.  Musculoskeletal:  Negative for back pain, joint pain and myalgias.  Skin: Negative.   Neurological:  Negative for dizziness.  Psychiatric/Behavioral:  The patient is not nervous/anxious.      Physical Exam Constitutional:      General: She is not in acute distress.    Appearance: She is well-developed. She is not diaphoretic.  HENT:     Nose: Nose normal.     Mouth/Throat:     Mouth: Mucous membranes are moist.     Pharynx: Oropharynx is clear.  Eyes:     Conjunctiva/sclera: Conjunctivae normal.  Neck:     Thyroid: No thyromegaly.  Cardiovascular:     Rate and Rhythm: Normal rate and regular rhythm.      Pulses: Normal pulses.     Heart sounds: Normal heart sounds.  Pulmonary:     Effort: Pulmonary effort is normal. No respiratory distress.     Breath sounds: Normal breath sounds.  Chest:  Breasts:    Right: Absent.  Abdominal:     General: Bowel sounds are normal. There is no distension.     Palpations: Abdomen is soft.     Tenderness: There is no abdominal tenderness.  Musculoskeletal:     Cervical back: Neck supple.     Right lower leg: No edema.     Left lower leg: No edema.     Comments: Right hip IM nail 07-12-20    Is able to move all extremities   Lymphadenopathy:     Cervical: No cervical adenopathy.  Skin:    General: Skin is warm and dry.     Comments: Dialysis access chest wall on left   Neurological:     Mental Status: She  is alert. Mental status is at baseline.  Psychiatric:        Mood and Affect: Mood normal.    ASSESSMENT/PLAN  TODAY  Multiple gastric ulcers: duodenal ulcer: upper GI bleed: esophageal reflux without esophagitis: is stable will continue protonix 40 mg twice daily carafate 1 gm four times daily   2. Closed right hip fracture sequela: is stable is status post right hip nail 07-12-20: will continue tylenol 650 mg three times daily   3.  Chronic kidney disease with end stage renal disease on dialysis due to type 2 diabetes mellitus/dependence on dialysis: is stable is on hemodialysis three days week; 1200 cc fluid restriction; will continue calcium acetate: 1334 mg with breakfast and supper 667 mg with lunch; and 667 mg with snacks.   4. Hypertension due to end stage renal disease (ESRD) due to type 2 diabetes mellitus: is stable b/p 138/66 will continue norvasc 10 mg daily; tenormin 25 mg daily clonidine 0.1 mg daily   5. Hyperlipidemia associated with type 2 diabetes mellitus is stable LDL 106 will continue lipitor 10 mg daily   6. Diabetes mellitus with end stage renal disease (ESRD) is stable hgb a1c 6.2 will continue lantus 10 units nightly;  humalog 5 units with meals  7. Aortic atherosclerosis/atherosclerotic peripheral vascular disease: is stable (ct 10-04-18)   8. Vascular dementia with behavioral disturbance: is without change weight is 143 pounds; will monitor   9. Anemia due to end stage renal disease: is stable hgb 11.3 will continue to monitor her status   10. Stage 1 infiltrating ductal carcinoma of female right breast/status post mastectomy on right. Will continue arimidex 1 mg daily   11. Multiple myeloma not having achieved remission: is followed by oncology  12. Herpes: is without outbreak: will continue acyclovir 200 mg daily   13. Major depression chronic recurrent is stable will continue zoloft 50 mg daily   14. Protein calorie malnutrition, severe: is stable albumin is 3.5 will continue supplement as directed and will continue prostat 30 mL three times daily   Will check hgb a1c and lipid profile   Ok Edwards NP [facility]  Contact [phone] Monday through Friday 8am- 5pm  After hours call [phone]

## 2020-11-28 DIAGNOSIS — Z9181 History of falling: Secondary | ICD-10-CM | POA: Diagnosis not present

## 2020-11-28 DIAGNOSIS — R279 Unspecified lack of coordination: Secondary | ICD-10-CM | POA: Diagnosis not present

## 2020-11-28 DIAGNOSIS — N186 End stage renal disease: Secondary | ICD-10-CM | POA: Diagnosis not present

## 2020-11-28 DIAGNOSIS — M6281 Muscle weakness (generalized): Secondary | ICD-10-CM | POA: Diagnosis not present

## 2020-11-29 DIAGNOSIS — R279 Unspecified lack of coordination: Secondary | ICD-10-CM | POA: Diagnosis not present

## 2020-11-29 DIAGNOSIS — Z9181 History of falling: Secondary | ICD-10-CM | POA: Diagnosis not present

## 2020-11-29 DIAGNOSIS — N186 End stage renal disease: Secondary | ICD-10-CM | POA: Diagnosis not present

## 2020-11-29 DIAGNOSIS — M6281 Muscle weakness (generalized): Secondary | ICD-10-CM | POA: Diagnosis not present

## 2020-11-29 DIAGNOSIS — Z992 Dependence on renal dialysis: Secondary | ICD-10-CM | POA: Diagnosis not present

## 2020-12-02 ENCOUNTER — Ambulatory Visit: Payer: Medicare PPO | Admitting: Gastroenterology

## 2020-12-02 ENCOUNTER — Inpatient Hospital Stay (HOSPITAL_COMMUNITY): Payer: Medicare PPO

## 2020-12-02 ENCOUNTER — Other Ambulatory Visit (HOSPITAL_COMMUNITY)
Admission: RE | Admit: 2020-12-02 | Discharge: 2020-12-02 | Disposition: A | Discharge status: Home / Self Care | Payer: Medicare PPO | Source: Skilled Nursing Facility | Attending: Adult Health | Admitting: Adult Health

## 2020-12-02 DIAGNOSIS — E1122 Type 2 diabetes mellitus with diabetic chronic kidney disease: Secondary | ICD-10-CM | POA: Diagnosis not present

## 2020-12-02 DIAGNOSIS — Z992 Dependence on renal dialysis: Secondary | ICD-10-CM | POA: Diagnosis not present

## 2020-12-02 DIAGNOSIS — N186 End stage renal disease: Secondary | ICD-10-CM | POA: Diagnosis not present

## 2020-12-02 LAB — HEMOGLOBIN A1C
Hgb A1c MFr Bld: 5.6 % (ref 4.8–5.6)
Mean Plasma Glucose: 114.02 mg/dL

## 2020-12-02 LAB — LIPID PANEL
Cholesterol: 167 mg/dL (ref 0–200)
HDL: 38 mg/dL — ABNORMAL LOW (ref >40)
LDL Cholesterol: 83 mg/dL (ref 0–99)
Total CHOL/HDL Ratio: 4.4 RATIO
Triglycerides: 229 mg/dL — ABNORMAL HIGH (ref <150)
VLDL: 46 mg/dL — ABNORMAL HIGH (ref 0–40)

## 2020-12-03 ENCOUNTER — Inpatient Hospital Stay (HOSPITAL_COMMUNITY): Payer: Medicare PPO

## 2020-12-03 ENCOUNTER — Other Ambulatory Visit: Payer: Self-pay

## 2020-12-03 VITALS — BP 163/66 | HR 62 | Temp 97.8°F | Resp 18 | Wt 135.6 lb

## 2020-12-03 DIAGNOSIS — C9000 Multiple myeloma not having achieved remission: Secondary | ICD-10-CM

## 2020-12-03 DIAGNOSIS — N186 End stage renal disease: Secondary | ICD-10-CM | POA: Diagnosis not present

## 2020-12-03 DIAGNOSIS — Z9181 History of falling: Secondary | ICD-10-CM | POA: Diagnosis not present

## 2020-12-03 DIAGNOSIS — E785 Hyperlipidemia, unspecified: Secondary | ICD-10-CM | POA: Diagnosis not present

## 2020-12-03 DIAGNOSIS — M8580 Other specified disorders of bone density and structure, unspecified site: Secondary | ICD-10-CM | POA: Diagnosis not present

## 2020-12-03 DIAGNOSIS — C50919 Malignant neoplasm of unspecified site of unspecified female breast: Secondary | ICD-10-CM | POA: Diagnosis not present

## 2020-12-03 DIAGNOSIS — R279 Unspecified lack of coordination: Secondary | ICD-10-CM | POA: Diagnosis not present

## 2020-12-03 DIAGNOSIS — I120 Hypertensive chronic kidney disease with stage 5 chronic kidney disease or end stage renal disease: Secondary | ICD-10-CM | POA: Diagnosis not present

## 2020-12-03 DIAGNOSIS — Z5111 Encounter for antineoplastic chemotherapy: Secondary | ICD-10-CM | POA: Diagnosis not present

## 2020-12-03 DIAGNOSIS — Z79811 Long term (current) use of aromatase inhibitors: Secondary | ICD-10-CM | POA: Diagnosis not present

## 2020-12-03 DIAGNOSIS — M6281 Muscle weakness (generalized): Secondary | ICD-10-CM | POA: Diagnosis not present

## 2020-12-03 DIAGNOSIS — E1122 Type 2 diabetes mellitus with diabetic chronic kidney disease: Secondary | ICD-10-CM | POA: Diagnosis not present

## 2020-12-03 LAB — CBC WITH DIFFERENTIAL/PLATELET
Abs Immature Granulocytes: 0.02 10*3/uL (ref 0.00–0.07)
Basophils Absolute: 0 10*3/uL (ref 0.0–0.1)
Basophils Relative: 1 %
Eosinophils Absolute: 0.1 10*3/uL (ref 0.0–0.5)
Eosinophils Relative: 2 %
HCT: 35.7 % — ABNORMAL LOW (ref 36.0–46.0)
Hemoglobin: 11.1 g/dL — ABNORMAL LOW (ref 12.0–15.0)
Immature Granulocytes: 0 %
Lymphocytes Relative: 17 %
Lymphs Abs: 0.9 10*3/uL (ref 0.7–4.0)
MCH: 30 pg (ref 26.0–34.0)
MCHC: 31.1 g/dL (ref 30.0–36.0)
MCV: 96.5 fL (ref 80.0–100.0)
Monocytes Absolute: 0.4 10*3/uL (ref 0.1–1.0)
Monocytes Relative: 8 %
Neutro Abs: 3.7 10*3/uL (ref 1.7–7.7)
Neutrophils Relative %: 72 %
Platelets: 320 10*3/uL (ref 150–400)
RBC: 3.7 MIL/uL — ABNORMAL LOW (ref 3.87–5.11)
RDW: 17.7 % — ABNORMAL HIGH (ref 11.5–15.5)
WBC: 5.2 10*3/uL (ref 4.0–10.5)
nRBC: 0 % (ref 0.0–0.2)

## 2020-12-03 LAB — COMPREHENSIVE METABOLIC PANEL
ALT: 12 U/L (ref 0–44)
AST: 18 U/L (ref 15–41)
Albumin: 3.8 g/dL (ref 3.5–5.0)
Alkaline Phosphatase: 114 U/L (ref 38–126)
Anion gap: 12 (ref 5–15)
BUN: 47 mg/dL — ABNORMAL HIGH (ref 8–23)
CO2: 25 mmol/L (ref 22–32)
Calcium: 9.1 mg/dL (ref 8.9–10.3)
Chloride: 97 mmol/L — ABNORMAL LOW (ref 98–111)
Creatinine, Ser: 6.05 mg/dL — ABNORMAL HIGH (ref 0.44–1.00)
GFR, Estimated: 6 mL/min — ABNORMAL LOW (ref >60)
Glucose, Bld: 139 mg/dL — ABNORMAL HIGH (ref 70–99)
Potassium: 4.1 mmol/L (ref 3.5–5.1)
Sodium: 134 mmol/L — ABNORMAL LOW (ref 135–145)
Total Bilirubin: 0.3 mg/dL (ref 0.3–1.2)
Total Protein: 6.9 g/dL (ref 6.5–8.1)

## 2020-12-03 LAB — MAGNESIUM: Magnesium: 2.4 mg/dL (ref 1.7–2.4)

## 2020-12-03 MED ORDER — DEXAMETHASONE 4 MG PO TABS
10.0000 mg | ORAL_TABLET | Freq: Once | ORAL | Status: AC
  Administered 2020-12-03: 10 mg via ORAL
  Filled 2020-12-03: qty 3

## 2020-12-03 MED ORDER — BORTEZOMIB CHEMO SQ INJECTION 3.5 MG (2.5MG/ML)
1.3000 mg/m2 | Freq: Once | INTRAMUSCULAR | Status: AC
  Administered 2020-12-03: 2.25 mg via SUBCUTANEOUS
  Filled 2020-12-03: qty 0.9

## 2020-12-03 MED ORDER — PROCHLORPERAZINE MALEATE 10 MG PO TABS
10.0000 mg | ORAL_TABLET | Freq: Once | ORAL | Status: AC
  Administered 2020-12-03: 10 mg via ORAL
  Filled 2020-12-03: qty 1

## 2020-12-03 NOTE — Progress Notes (Signed)
Kameron J Morneault presents today for Velcade injection per the provider's orders.  Stable during administration without incident; injection site WNL; see MAR for injection details.  Patient tolerated procedure well and without incident.  No questions or complaints noted at this time. Discharge from clinic via wheelchair in stable condition.  Alert and oriented X 3.  Follow up with [facility] as scheduled.  ?

## 2020-12-04 DIAGNOSIS — Z992 Dependence on renal dialysis: Secondary | ICD-10-CM | POA: Diagnosis not present

## 2020-12-04 DIAGNOSIS — R279 Unspecified lack of coordination: Secondary | ICD-10-CM | POA: Diagnosis not present

## 2020-12-04 DIAGNOSIS — N186 End stage renal disease: Secondary | ICD-10-CM | POA: Diagnosis not present

## 2020-12-04 DIAGNOSIS — Z9181 History of falling: Secondary | ICD-10-CM | POA: Diagnosis not present

## 2020-12-04 DIAGNOSIS — M6281 Muscle weakness (generalized): Secondary | ICD-10-CM | POA: Diagnosis not present

## 2020-12-05 DIAGNOSIS — Z9181 History of falling: Secondary | ICD-10-CM | POA: Diagnosis not present

## 2020-12-05 DIAGNOSIS — R279 Unspecified lack of coordination: Secondary | ICD-10-CM | POA: Diagnosis not present

## 2020-12-05 DIAGNOSIS — M6281 Muscle weakness (generalized): Secondary | ICD-10-CM | POA: Diagnosis not present

## 2020-12-05 DIAGNOSIS — N186 End stage renal disease: Secondary | ICD-10-CM | POA: Diagnosis not present

## 2020-12-06 DIAGNOSIS — Z79899 Other long term (current) drug therapy: Secondary | ICD-10-CM | POA: Diagnosis not present

## 2020-12-06 DIAGNOSIS — N186 End stage renal disease: Secondary | ICD-10-CM | POA: Diagnosis not present

## 2020-12-06 DIAGNOSIS — Z992 Dependence on renal dialysis: Secondary | ICD-10-CM | POA: Diagnosis not present

## 2020-12-07 DIAGNOSIS — M6281 Muscle weakness (generalized): Secondary | ICD-10-CM | POA: Diagnosis not present

## 2020-12-07 DIAGNOSIS — Z9181 History of falling: Secondary | ICD-10-CM | POA: Diagnosis not present

## 2020-12-07 DIAGNOSIS — R279 Unspecified lack of coordination: Secondary | ICD-10-CM | POA: Diagnosis not present

## 2020-12-07 DIAGNOSIS — N186 End stage renal disease: Secondary | ICD-10-CM | POA: Diagnosis not present

## 2020-12-09 DIAGNOSIS — N186 End stage renal disease: Secondary | ICD-10-CM | POA: Diagnosis not present

## 2020-12-09 DIAGNOSIS — Z9181 History of falling: Secondary | ICD-10-CM | POA: Diagnosis not present

## 2020-12-09 DIAGNOSIS — Z992 Dependence on renal dialysis: Secondary | ICD-10-CM | POA: Diagnosis not present

## 2020-12-09 DIAGNOSIS — R279 Unspecified lack of coordination: Secondary | ICD-10-CM | POA: Diagnosis not present

## 2020-12-09 DIAGNOSIS — M6281 Muscle weakness (generalized): Secondary | ICD-10-CM | POA: Diagnosis not present

## 2020-12-10 DIAGNOSIS — M6281 Muscle weakness (generalized): Secondary | ICD-10-CM | POA: Diagnosis not present

## 2020-12-10 DIAGNOSIS — Z9181 History of falling: Secondary | ICD-10-CM | POA: Diagnosis not present

## 2020-12-10 DIAGNOSIS — R279 Unspecified lack of coordination: Secondary | ICD-10-CM | POA: Diagnosis not present

## 2020-12-10 DIAGNOSIS — N186 End stage renal disease: Secondary | ICD-10-CM | POA: Diagnosis not present

## 2020-12-11 DIAGNOSIS — M6281 Muscle weakness (generalized): Secondary | ICD-10-CM | POA: Diagnosis not present

## 2020-12-11 DIAGNOSIS — R279 Unspecified lack of coordination: Secondary | ICD-10-CM | POA: Diagnosis not present

## 2020-12-11 DIAGNOSIS — N186 End stage renal disease: Secondary | ICD-10-CM | POA: Diagnosis not present

## 2020-12-11 DIAGNOSIS — Z23 Encounter for immunization: Secondary | ICD-10-CM | POA: Diagnosis not present

## 2020-12-11 DIAGNOSIS — Z9181 History of falling: Secondary | ICD-10-CM | POA: Diagnosis not present

## 2020-12-11 DIAGNOSIS — Z992 Dependence on renal dialysis: Secondary | ICD-10-CM | POA: Diagnosis not present

## 2020-12-12 DIAGNOSIS — M6281 Muscle weakness (generalized): Secondary | ICD-10-CM | POA: Diagnosis not present

## 2020-12-12 DIAGNOSIS — R279 Unspecified lack of coordination: Secondary | ICD-10-CM | POA: Diagnosis not present

## 2020-12-12 DIAGNOSIS — N186 End stage renal disease: Secondary | ICD-10-CM | POA: Diagnosis not present

## 2020-12-12 DIAGNOSIS — Z9181 History of falling: Secondary | ICD-10-CM | POA: Diagnosis not present

## 2020-12-13 DIAGNOSIS — Z9181 History of falling: Secondary | ICD-10-CM | POA: Diagnosis not present

## 2020-12-13 DIAGNOSIS — R279 Unspecified lack of coordination: Secondary | ICD-10-CM | POA: Diagnosis not present

## 2020-12-13 DIAGNOSIS — Z23 Encounter for immunization: Secondary | ICD-10-CM | POA: Diagnosis not present

## 2020-12-13 DIAGNOSIS — M6281 Muscle weakness (generalized): Secondary | ICD-10-CM | POA: Diagnosis not present

## 2020-12-13 DIAGNOSIS — N186 End stage renal disease: Secondary | ICD-10-CM | POA: Diagnosis not present

## 2020-12-13 DIAGNOSIS — Z992 Dependence on renal dialysis: Secondary | ICD-10-CM | POA: Diagnosis not present

## 2020-12-15 DIAGNOSIS — N186 End stage renal disease: Secondary | ICD-10-CM | POA: Diagnosis not present

## 2020-12-15 DIAGNOSIS — R279 Unspecified lack of coordination: Secondary | ICD-10-CM | POA: Diagnosis not present

## 2020-12-15 DIAGNOSIS — M6281 Muscle weakness (generalized): Secondary | ICD-10-CM | POA: Diagnosis not present

## 2020-12-15 DIAGNOSIS — Z9181 History of falling: Secondary | ICD-10-CM | POA: Diagnosis not present

## 2020-12-16 DIAGNOSIS — N186 End stage renal disease: Secondary | ICD-10-CM | POA: Diagnosis not present

## 2020-12-16 DIAGNOSIS — E119 Type 2 diabetes mellitus without complications: Secondary | ICD-10-CM | POA: Diagnosis not present

## 2020-12-16 DIAGNOSIS — R279 Unspecified lack of coordination: Secondary | ICD-10-CM | POA: Diagnosis not present

## 2020-12-16 DIAGNOSIS — Z992 Dependence on renal dialysis: Secondary | ICD-10-CM | POA: Diagnosis not present

## 2020-12-16 DIAGNOSIS — M6281 Muscle weakness (generalized): Secondary | ICD-10-CM | POA: Diagnosis not present

## 2020-12-16 DIAGNOSIS — Z9181 History of falling: Secondary | ICD-10-CM | POA: Diagnosis not present

## 2020-12-17 ENCOUNTER — Inpatient Hospital Stay (HOSPITAL_COMMUNITY): Payer: Medicare PPO | Attending: Hematology

## 2020-12-17 ENCOUNTER — Inpatient Hospital Stay (HOSPITAL_COMMUNITY): Payer: Medicare PPO

## 2020-12-17 ENCOUNTER — Non-Acute Institutional Stay (INDEPENDENT_AMBULATORY_CARE_PROVIDER_SITE_OTHER): Payer: Medicare PPO | Admitting: Adult Health

## 2020-12-17 ENCOUNTER — Encounter: Payer: Self-pay | Admitting: Adult Health

## 2020-12-17 ENCOUNTER — Other Ambulatory Visit: Payer: Self-pay

## 2020-12-17 VITALS — BP 177/77 | HR 61 | Temp 98.0°F | Resp 18 | Wt 134.0 lb

## 2020-12-17 DIAGNOSIS — R279 Unspecified lack of coordination: Secondary | ICD-10-CM | POA: Diagnosis not present

## 2020-12-17 DIAGNOSIS — Z5111 Encounter for antineoplastic chemotherapy: Secondary | ICD-10-CM | POA: Diagnosis not present

## 2020-12-17 DIAGNOSIS — M6281 Muscle weakness (generalized): Secondary | ICD-10-CM | POA: Diagnosis not present

## 2020-12-17 DIAGNOSIS — C9000 Multiple myeloma not having achieved remission: Secondary | ICD-10-CM

## 2020-12-17 DIAGNOSIS — Z9181 History of falling: Secondary | ICD-10-CM | POA: Diagnosis not present

## 2020-12-17 DIAGNOSIS — N186 End stage renal disease: Secondary | ICD-10-CM | POA: Diagnosis not present

## 2020-12-17 DIAGNOSIS — Z0000 Encounter for general adult medical examination without abnormal findings: Secondary | ICD-10-CM

## 2020-12-17 LAB — CBC WITH DIFFERENTIAL/PLATELET
Abs Immature Granulocytes: 0.01 10*3/uL (ref 0.00–0.07)
Basophils Absolute: 0.1 10*3/uL (ref 0.0–0.1)
Basophils Relative: 1 %
Eosinophils Absolute: 0.2 10*3/uL (ref 0.0–0.5)
Eosinophils Relative: 4 %
HCT: 33.8 % — ABNORMAL LOW (ref 36.0–46.0)
Hemoglobin: 10.5 g/dL — ABNORMAL LOW (ref 12.0–15.0)
Immature Granulocytes: 0 %
Lymphocytes Relative: 19 %
Lymphs Abs: 1 10*3/uL (ref 0.7–4.0)
MCH: 30.7 pg (ref 26.0–34.0)
MCHC: 31.1 g/dL (ref 30.0–36.0)
MCV: 98.8 fL (ref 80.0–100.0)
Monocytes Absolute: 0.5 10*3/uL (ref 0.1–1.0)
Monocytes Relative: 9 %
Neutro Abs: 3.5 10*3/uL (ref 1.7–7.7)
Neutrophils Relative %: 67 %
Platelets: 256 10*3/uL (ref 150–400)
RBC: 3.42 MIL/uL — ABNORMAL LOW (ref 3.87–5.11)
RDW: 18.7 % — ABNORMAL HIGH (ref 11.5–15.5)
WBC: 5.2 10*3/uL (ref 4.0–10.5)
nRBC: 0 % (ref 0.0–0.2)

## 2020-12-17 LAB — COMPREHENSIVE METABOLIC PANEL
ALT: 16 U/L (ref 0–44)
AST: 20 U/L (ref 15–41)
Albumin: 3.9 g/dL (ref 3.5–5.0)
Alkaline Phosphatase: 101 U/L (ref 38–126)
Anion gap: 12 (ref 5–15)
BUN: 43 mg/dL — ABNORMAL HIGH (ref 8–23)
CO2: 27 mmol/L (ref 22–32)
Calcium: 8.9 mg/dL (ref 8.9–10.3)
Chloride: 98 mmol/L (ref 98–111)
Creatinine, Ser: 6.19 mg/dL — ABNORMAL HIGH (ref 0.44–1.00)
GFR, Estimated: 6 mL/min — ABNORMAL LOW (ref >60)
Glucose, Bld: 136 mg/dL — ABNORMAL HIGH (ref 70–99)
Potassium: 3.6 mmol/L (ref 3.5–5.1)
Sodium: 137 mmol/L (ref 135–145)
Total Bilirubin: 0.5 mg/dL (ref 0.3–1.2)
Total Protein: 6.8 g/dL (ref 6.5–8.1)

## 2020-12-17 LAB — MAGNESIUM: Magnesium: 2.2 mg/dL (ref 1.7–2.4)

## 2020-12-17 MED ORDER — PROCHLORPERAZINE MALEATE 10 MG PO TABS
10.0000 mg | ORAL_TABLET | Freq: Once | ORAL | Status: AC
  Administered 2020-12-17: 10 mg via ORAL
  Filled 2020-12-17: qty 1

## 2020-12-17 MED ORDER — DEXAMETHASONE 4 MG PO TABS
10.0000 mg | ORAL_TABLET | Freq: Once | ORAL | Status: AC
  Administered 2020-12-17: 10 mg via ORAL
  Filled 2020-12-17: qty 3

## 2020-12-17 MED ORDER — BORTEZOMIB CHEMO SQ INJECTION 3.5 MG (2.5MG/ML)
1.3000 mg/m2 | Freq: Once | INTRAMUSCULAR | Status: AC
  Administered 2020-12-17: 2.25 mg via SUBCUTANEOUS
  Filled 2020-12-17: qty 0.9

## 2020-12-17 NOTE — Patient Instructions (Signed)
[facility]  Discharge Instructions: Thank you for choosing [facility] to provide your oncology and hematology care.  If you have a lab appointment with the [facility], please come in thru the Main Entrance and check in at the main information desk.  Wear comfortable clothing and clothing appropriate for easy access to any Portacath or PICC line.   We strive to give you quality time with your provider. You may need to reschedule your appointment if you arrive late (15 or more minutes).  Arriving late affects you and other patients whose appointments are after yours.  Also, if you miss three or more appointments without notifying the office, you may be dismissed from the clinic at the provider's discretion.      For prescription refill requests, have your pharmacy contact our office and allow 72 hours for refills to be completed.    Today you received the following chemotherapy and/or immunotherapy agents Velcade      To help prevent nausea and vomiting after your treatment, we encourage you to take your nausea medication as directed.  BELOW ARE SYMPTOMS THAT SHOULD BE REPORTED IMMEDIATELY: *FEVER GREATER THAN 100.4 F (38 C) OR HIGHER *CHILLS OR SWEATING *NAUSEA AND VOMITING THAT IS NOT CONTROLLED WITH YOUR NAUSEA MEDICATION *UNUSUAL SHORTNESS OF BREATH *UNUSUAL BRUISING OR BLEEDING *URINARY PROBLEMS (pain or burning when urinating, or frequent urination) *BOWEL PROBLEMS (unusual diarrhea, constipation, pain near the anus) TENDERNESS IN MOUTH AND THROAT WITH OR WITHOUT PRESENCE OF ULCERS (sore throat, sores in mouth, or a toothache) UNUSUAL RASH, SWELLING OR PAIN  UNUSUAL VAGINAL DISCHARGE OR ITCHING   Items with * indicate a potential emergency and should be followed up as soon as possible or go to the Emergency Department if any problems should occur.  Please show the CHEMOTHERAPY ALERT CARD or IMMUNOTHERAPY ALERT CARD at check-in to the Emergency  Department and triage nurse.  Should you have questions after your visit or need to cancel or reschedule your appointment, please contact [facility] [phone]  and follow the prompts.  Office hours are 8:00 a.m. to 4:30 p.m. Monday - Friday. Please note that voicemails left after 4:00 p.m. may not be returned until the following business day.  We are closed weekends and major holidays. You have access to a nurse at all times for urgent questions. Please call the main number to the clinic [phone] and follow the prompts.  For any non-urgent questions, you may also contact your provider using MyChart. We now offer e-Visits for anyone 57 and older to request care online for non-urgent symptoms. For details visit [email or web address].   Also download the MyChart app! Go to the app store, search "MyChart", open the app, select [facility], and log in with your MyChart username and password.  Due to Covid, a mask is required upon entering the hospital/clinic. If you do not have a mask, one will be given to you upon arrival. For doctor visits, patients may have 1 support person aged 72 or older with them. For treatment visits, patients cannot have anyone with them due to current Covid guidelines and our immunocompromised population.

## 2020-12-17 NOTE — Progress Notes (Signed)
Subjective:   Carly Wood is a 85 y.o. female who presents for Medicare Annual (Subsequent) preventive examination.  Review of Systems    Review of Systems  Constitutional:  Negative for malaise/fatigue.  Respiratory:  Negative for cough and shortness of breath.   Cardiovascular:  Negative for chest pain, palpitations and leg swelling.  Gastrointestinal:  Negative for abdominal pain, constipation and heartburn.  Musculoskeletal:  Negative for back pain, joint pain and myalgias.  Skin: Negative.   Neurological:  Negative for dizziness.  Psychiatric/Behavioral:  The patient is not nervous/anxious.    Cardiac Risk Factors include: advanced age (>90mn, >>33women);diabetes mellitus;sedentary lifestyle;hypertension     Objective:    Today's Vitals   12/17/20 1001 12/17/20 1004  BP: 136/68   Pulse: 72   Resp: 20   Temp: 98.4 F (36.9 C)   Weight: 136 lb 9.6 oz (62 kg)   Height: '5\' 3"'  (1.6 m)   PainSc:  0-No pain   Body mass index is 24.2 kg/m.  Advanced Directives 12/03/2020 11/19/2020 11/07/2020 11/05/2020 10/29/2020 10/22/2020 10/08/2020  Does Patient Have a Medical Advance Directive? Yes Yes Yes Yes Yes Yes Yes  Type of [place]amed[place]of [place] will [place] will Out of facility DNR (pink MOST or yellow form) HL[place]ll[place]Living will Living will;Healthcare Power of [place]Living will [place] will  Does patient want to make changes to medical advance directive? - - No - Patient declined No - Patient declined No - Patient declined - No - Patient declined  Copy of [place]in Chart? No - copy requested No - copy requested - No - copy requested No - copy requested No - copy requested No - copy requested  Would patient like information on creating a medical advance directive? No - Patient declined No - Patient declined - No - Patient  declined No - Patient declined No - Patient declined No - Patient declined  Pre-existing out of facility DNR order (yellow form or pink MOST form) - - Yellow form placed in chart (order not valid for inpatient use) - - - -    Current Medications (verified) Outpatient Encounter Medications as of 12/17/2020  Medication Sig   acetaminophen (TYLENOL) 650 MG CR tablet Take 650 mg by mouth every 8 (eight) hours.   acyclovir (ZOVIRAX) 200 MG capsule Take 200 mg by mouth in the morning. (0800)   Amino Acids-Protein Hydrolys (FEEDING SUPPLEMENT, PRO-STAT SUGAR FREE 64,) LIQD Take 30 mLs by mouth 3 (three) times daily with meals. (0800, 1200 & 1800)   amLODipine (NORVASC) 10 MG tablet    anastrozole (ARIMIDEX) 1 MG tablet TAKE 1 TABLET BY MOUTH DAILY   atenolol (TENORMIN) 25 MG tablet Take 1 tablet (25 mg total) by mouth in the morning. (0800)   atorvastatin (LIPITOR) 10 MG tablet Take 10 mg by mouth at bedtime. (2000)   Balsam [place]-Castor Oil (VENELEX) OINT Apply topically. Apply to bilateral buttocks and sacrum every shift.   BORTEZOMIB IJ Inject as directed once a week.   Calcium Acetate 667 MG TABS Take 667-1,334 mg by mouth See admin instructions. Take 2 tablets (1334 mg) by mouth with each meal (0800 &1730) and take 1 tablet (667 mg) by mouth with each snack (2000)   calcium-vitamin D (OSCAL WITH D) 500-200 MG-UNIT tablet Take 1 tablet by mouth in the morning. (0800)   cloNIDine (CATAPRES) 0.1 MG tablet Take 1  tablet (0.1 mg total) by mouth at bedtime. (2000) Hold for SBP < 110 bpm   insulin glargine (LANTUS SOLOSTAR) 100 UNIT/ML Solostar Pen Inject 10 Units into the skin at bedtime. (2000)   insulin lispro (HUMALOG) 100 UNIT/ML injection Inject 5 Units into the skin 3 (three) times daily with meals. (0800, 1200 & 1800) Give for CBG>150.   melatonin 3 MG TABS tablet Take 6 mg by mouth at bedtime. (2000) For Sleep   multivitamin (RENA-VIT) TABS tablet Take 1 tablet by mouth at bedtime. (2100)   NON  FORMULARY Diet-Regular Diet with thin liquids   Nutritional Supplements (FEEDING SUPPLEMENT, NEPRO CARB STEADY,) LIQD Take 237 mLs by mouth at bedtime. (2000)   ondansetron (ZOFRAN) 4 MG tablet Take 1 tablet (4 mg total) by mouth every 6 (six) hours as needed for nausea.   pantoprazole (PROTONIX) 40 MG tablet Take 40 mg by mouth 2 (two) times daily.   sertraline (ZOLOFT) 50 MG tablet Take 50 mg by mouth in the morning. (0800)   sucralfate (CARAFATE) 1 GM/10ML suspension Take 1 g by mouth in the morning, at noon, in the evening, and at bedtime. (0800, 1200, 1700 & 2100)   No facility-administered encounter medications on file as of 12/17/2020.    Allergies (verified) Ace inhibitors, Angiotensin receptor blockers, Penicillins, and Penicillin g   History: Past Medical History:  Diagnosis Date   Anemia     chronic macrocytic anemia   Anxiety    Chronic kidney disease    Chronic renal disease, stage 4, severely decreased glomerular filtration rate (GFR) between 15-29 mL/min/1.73 square meter (HCC) 35/70/1779   Complication of anesthesia    delirious after Breast Surgery   Dementia ([place])    mild   Depression    Diabetes mellitus with ESRD (end-stage renal disease) ([place])    type II   Dysphagia    Dyspnea    with activity   GERD (gastroesophageal reflux disease)    Glaucoma    Hyperlipidemia    Hypertension    Pneumonia    Stage 1 infiltrating ductal carcinoma of right female breast ([place]) 08/21/2015   ER+ PR+ HER 2 neu + (3+) T1cN0    Past Surgical History:  Procedure Laterality Date   A/V FISTULAGRAM Right 07/05/2020   Procedure: A/V Fistulagram;  Surgeon: Elam Dutch, MD;  Location: [place] [facility];  Service: Cardiovascular;  Laterality: Right;   AV FISTULA PLACEMENT Left 11/22/2017   Procedure: ARTERIOVENOUS (AV) FISTULA CREATION LEFT ARM;  Surgeon: Elam Dutch, MD;  Location: [facility] OR;  Service: Vascular;  Laterality: Left;   AV FISTULA PLACEMENT Right 04/04/2020    Procedure: RIGHT ARM ARTERIOVENOUS FISTULA CREATION;  Surgeon: Rosetta Posner, MD;  [facility];  Service: Vascular;  Laterality: Right;   AV FISTULA PLACEMENT Right 08/20/2020   Procedure: ARTERIOVENOUS (AV) FISTULA LIGATION RIGHT ARM;  Surgeon: Elam Dutch, MD;  Location: [facility] OR;  Service: Vascular;  Laterality: Right;   BIOPSY  08/07/2016   Procedure: BIOPSY;  Surgeon: Daneil Dolin, MD;  [facility];  Service: Endoscopy;;  gastric ulcer biopsy   COLONOSCOPY     ESOPHAGOGASTRODUODENOSCOPY N/A 08/07/2016   LA Grade A esophagitis s/p dilation, small hiatal hernia, multiple gastric ulcers and erosions, duodenal erosions s/p biopsy. Negative H.pylori    ESOPHAGOGASTRODUODENOSCOPY N/A 11/27/2016   normal esophagus, previously noted gastric ulcers completely healed, normal duodenum.    ESOPHAGOGASTRODUODENOSCOPY (EGD) WITH PROPOFOL N/A 07/23/2020   Procedure: ESOPHAGOGASTRODUODENOSCOPY (EGD)  WITH PROPOFOL;  Surgeon: Rogene Houston, MD;  [facility];  Service: Endoscopy;  Laterality: N/A;   FISTULA SUPERFICIALIZATION Left 02/14/2018   Procedure: FISTULA SUPERFICIALIZATION LEFT ARM;  Surgeon: Angelia Mould, MD;  Location: [facility] OR;  Service: Vascular;  Laterality: Left;   FRACTURE SURGERY Right    ankle   HOT HEMOSTASIS  07/23/2020   Procedure: HOT HEMOSTASIS (ARGON PLASMA COAGULATION/BICAP);  Surgeon: Rogene Houston, MD;  [facility];  Service: Endoscopy;;   INTRAMEDULLARY (IM) NAIL INTERTROCHANTERIC Right 07/12/2020   Procedure: INTRAMEDULLARY (IM) NAIL INTERTROCHANTRIC;  Surgeon: Mordecai Rasmussen, MD;  [facility];  Service: Orthopedics;  Laterality: Right;   MALONEY DILATION N/A 08/07/2016   Procedure: Venia Minks DILATION;  Surgeon: Daneil Dolin, MD;  [facility];  Service: Endoscopy;  Laterality: N/A;   MASTECTOMY, PARTIAL Right    multiple myeloma     PERIPHERAL VASCULAR BALLOON ANGIOPLASTY Left 07/13/2019   Procedure: PERIPHERAL  VASCULAR BALLOON ANGIOPLASTY;  Surgeon: Marty Heck, MD;  Location: [place] [facility];  Service: Cardiovascular;  Laterality: Left;  arm fistulogram   PERIPHERAL VASCULAR BALLOON ANGIOPLASTY Right 05/22/2020   Procedure: PERIPHERAL VASCULAR BALLOON ANGIOPLASTY;  Surgeon: Cherre Robins, MD;  Location: [facility] [facility];  Service: Cardiovascular;  Laterality: Right;  arm fistula   PERIPHERAL VASCULAR BALLOON ANGIOPLASTY Right 07/05/2020   Procedure: PERIPHERAL VASCULAR BALLOON ANGIOPLASTY;  Surgeon: Elam Dutch, MD;  Location: [place] [facility];  Service: Cardiovascular;  Laterality: Right;  arm fistula   PORT-A-CATH REMOVAL Left 11/22/2017   Procedure: REMOVAL PORT-A-CATH LEFT CHEST;  Surgeon: Elam Dutch, MD;  Location: [place];  Service: Vascular;  Laterality: Left;   RETINAL DETACHMENT SURGERY Right    SCLEROTHERAPY  07/23/2020   Procedure: SCLEROTHERAPY;  Surgeon: Rogene Houston, MD;  [facility];  Service: Endoscopy;;   UPPER EXTREMITY VENOGRAPHY N/A 08/22/2020   Procedure: LEFT UPPER & CENTRAL VENOGRAPHY;  Surgeon: Marty Heck, MD;  Location: [place] [facility];  Service: Cardiovascular;  Laterality: N/A;   Family History  Problem Relation Age of Onset   Multiple myeloma Sister    Brain cancer Sister    Dementia Mother        died at 85   Stroke Mother    Heart failure Mother    Diabetes Mother    Heart disease Father    Prostate cancer Brother    Colon cancer Neg Hx    Social History   Socioeconomic History   Marital status: Single    Spouse name: Not on file   Number of children: Not on file   Years of education: Not on file   Highest education level: Not on file  Occupational History   Occupation: retired   Tobacco Use   Smoking status: Never   Smokeless tobacco: Never  Vaping Use   Vaping Use: Never used  Substance and Sexual Activity   Alcohol use: No    Alcohol/week: 0.0 standard drinks   Drug use: No   Sexual  activity: Never  Other Topics Concern   Not on file  Social History Narrative   Long term resident of SNF    Social Determinants of Health   Financial Resource Strain: Low Risk    Difficulty of Paying Living Expenses: Not very hard  Food Insecurity: No Food Insecurity   Worried About Running Out of Food in the Last Year: Never true   Ran Out  of Food in the Last Year: Never true  Transportation Needs: No Transportation Needs   Lack of Transportation (Medical): No   Lack of Transportation (Non-Medical): No  Physical Activity: Inactive   Days of Exercise per Week: 0 days   Minutes of Exercise per Session: 0 min  Stress: No Stress Concern Present   Feeling of Stress : Not at all  Social Connections: Moderately Isolated   Frequency of Communication with Friends and Family: More than three times a week   Frequency of Social Gatherings with Friends and Family: Once a week   Attends Religious Services: More than 4 times per year   Active Member of [employer] or Organizations: No   Attends Music therapist: Never   Marital Status: Never married    Tobacco Counseling Counseling given: Not Answered   Clinical Intake:  Pre-visit preparation completed: Yes  Pain : No/denies pain Pain Score: 0-No pain Pain Type: Chronic pain Pain Frequency: Rarely     BMI - recorded: 24.2 Nutritional Status: BMI of 19-24  Normal Nutritional Risks: Unintentional weight gain Diabetes: Yes CBG done?: Yes (per facility) CBG resulted in Enter/ Edit results?: Yes (on MAR) Did pt. bring in CBG monitor from home?: No  How often do you need to have someone help you when you read instructions, pamphlets, or other written materials from your doctor or pharmacy?: 5 - Always  Diabetic?yes   Interpreter Needed?: No      Activities of Daily Living In your present state of health, do you have any difficulty performing the following activities: 12/17/2020 12/17/2020  Hearing? N N  Vision? N  N  Difficulty concentrating or making decisions? Tempie Donning  Walking or climbing stairs? Y Y  Dressing or bathing? Y Y  Doing errands, shopping? Tempie Donning  Preparing Food and eating ? Y Y  Using the Toilet? Y Y  In the past six months, have you accidently leaked urine? Y Y  Do you have problems with loss of bowel control? Y Y  Managing your Medications? Y Y  Managing your Finances? Tempie Donning  Housekeeping or managing your Housekeeping? Tempie Donning  Some recent data might be hidden    Patient Care Team: Gerlene Fee, NP as PCP - General (Geriatric Medicine) Gala Romney Cristopher Estimable, MD as Consulting Physician (Gastroenterology) Fran Lowes, MD (Inactive) as Consulting Physician (Nephrology) [facility] [place] [facility])  Indicate any recent Medical Services you may have received from other than [facility] providers in the past year (date may be approximate).     Assessment:   This is a routine wellness examination for [place].  Hearing/Vision screen No results found.  Dietary issues and exercise activities discussed: Current Exercise Habits: The patient does not participate in regular exercise at present, Exercise limited by: cardiac condition(s)   Goals Addressed             This Visit's Progress    DIET - INCREASE WATER INTAKE       Follow up with Provider as scheduled   On track    General - Client will not be readmitted within 30 days (C-SNP)   On track      Depression Screen PHQ 2/9 Scores 12/17/2020 01/09/2020 11/08/2019  PHQ - 2 Score 0 0 0    Fall Risk Fall Risk  12/17/2020 11/08/2019  Falls in the past year? 0 1  Number falls in past yr: 0 0  Injury with Fall? 0 0    FALL RISK  PREVENTION PERTAINING TO THE HOME:  Any stairs in or around the home? No  If so, are there any without handrails? Yes  Home free of loose throw rugs in walkways, pet beds, electrical cords, etc? Yes  Adequate lighting in your home to reduce risk of falls? Yes   ASSISTIVE DEVICES UTILIZED TO  PREVENT FALLS:  Life alert? No  Use of a cane, walker or w/c? Yes  Grab bars in the bathroom? Yes  Shower chair or bench in shower? Yes  Elevated toilet seat or a handicapped toilet? Yes   TIMED UP AND GO:  Was the test performed? No .  Length of time to ambulate nonambulatory   Gait unsteady with use of assistive device, provider informed and education provided.   Cognitive Function:     6CIT Screen 11/08/2019  What Year? 0 points  What month? 0 points  What time? 3 points  Count back from 20 2 points  Months in reverse 2 points  Repeat phrase 2 points  Total Score 9    Immunizations Immunization History  Administered Date(s) Administered   Fluad Quad(high Dose 65+) 11/22/2018   Hepatitis B, adult 02/28/2020   Influenza,inj,Quad PF,6+ Mos 11/22/2015   Influenza,inj,quad, With Preservative 12/20/2014, 12/31/2016   Influenza-Unspecified 11/22/2018, 12/15/2019   Moderna SARS-COV2 Booster Vaccination 06/19/2020   Moderna Sars-Covid-2 Vaccination 05/11/2019, 06/05/2019, 01/11/2020   PPD Test 05/03/2019, 06/23/2019, 08/16/2019, 06/27/2020, 07/13/2020   Pneumococcal Conjugate-13 12/20/2014   Pneumococcal Polysaccharide-23 12/06/2015   Pneumococcal-Unspecified 12/20/2014    TDAP status: Up to date  Flu Vaccine status: Up to date  Pneumococcal vaccine status: Up to date  Covid-19 vaccine status: Completed vaccines  Qualifies for Shingles Vaccine? Yes   Zostavax completed Yes   Shingrix Completed?: Yes  Screening Tests Health Maintenance  Topic Date Due   OPHTHALMOLOGY EXAM  Never done   TETANUS/TDAP  Never done   Zoster Vaccines- Shingrix (1 of 2) Never done   INFLUENZA VACCINE  10/07/2020   COVID-19 Vaccine (5 - Booster for Moderna series) 10/19/2020   FOOT EXAM  11/02/2020   HEMOGLOBIN A1C  06/01/2021   DEXA SCAN  Completed   HPV VACCINES  Aged Out   URINE MICROALBUMIN  Discontinued    Health Maintenance  Health Maintenance Due  Topic Date Due    OPHTHALMOLOGY EXAM  Never done   TETANUS/TDAP  Never done   Zoster Vaccines- Shingrix (1 of 2) Never done   INFLUENZA VACCINE  10/07/2020   COVID-19 Vaccine (5 - Booster for Moderna series) 10/19/2020   FOOT EXAM  11/02/2020    Colorectal cancer screening: No longer required.   Mammogram status: No longer required due to age .  Bone Density status: Ordered  . Pt provided with contact info and advised to call to schedule appt.  Lung Cancer Screening: (Low Dose CT Chest recommended if Age 53-80 years, 30 pack-year currently smoking OR have quit w/in 15years.) does not qualify.   Lung Cancer Screening Referral: n/a  Additional Screening:  Hepatitis C Screening: does not qualify; Completed   Vision Screening: Recommended annual ophthalmology exams for early detection of glaucoma and other disorders of the eye. Is the patient up to date with their annual eye exam?  Yes  Who is the provider or what is the name of the office in which the patient attends annual eye exams?  If pt is not established with a provider, would they like to be referred to a provider to establish care? Yes .  Dental Screening: Recommended annual dental exams for proper oral hygiene  Community Resource Referral / Chronic Care Management: CRR required this visit?  No   CCM required this visit?  No      Plan:     I have personally reviewed and noted the following in the patient's chart:   Medical and social history Use of alcohol, tobacco or illicit drugs  Current medications and supplements including opioid prescriptions.  Functional ability and status Nutritional status Physical activity Advanced directives List of other physicians Hospitalizations, surgeries, and ER visits in previous 12 months Vitals Screenings to include cognitive, depression, and falls Referrals and appointments  In addition, I have reviewed and discussed with patient certain preventive protocols, quality metrics, and best  practice recommendations. A written personalized care plan for preventive services as well as general preventive health recommendations were provided to patient.     Gerlene Fee, NP   12/17/2020   Nurse Notes:

## 2020-12-17 NOTE — Patient Instructions (Signed)
  Carly Wood , Thank you for taking time to come for your Medicare Wellness Visit. I appreciate your ongoing commitment to your health goals. Please review the following plan we discussed and let me know if I can assist you in the future.   These are the goals we discussed:  Goals      DIET - INCREASE WATER INTAKE     Follow up with Provider as scheduled     General - Client will not be readmitted within 30 days (C-SNP)        This is a list of the screening recommended for you and due dates:  Health Maintenance  Topic Date Due   Eye exam for diabetics  Never done   Tetanus Vaccine  Never done   Zoster (Shingles) Vaccine (1 of 2) Never done   Flu Shot  10/07/2020   COVID-19 Vaccine (5 - Booster for Moderna series) 10/19/2020   Complete foot exam   11/02/2020   Hemoglobin A1C  06/01/2021   DEXA scan (bone density measurement)  Completed   HPV Vaccine  Aged Out   Urine Protein Check  Discontinued

## 2020-12-17 NOTE — Progress Notes (Signed)
Carly Wood presents today for Velcade injection per the provider's orders.  Stable during administration without incident; injection site WNL; see MAR for injection details.  Patient tolerated procedure well and without incident.  No questions or complaints noted at this time. Discharge from clinic via wheelchair in stable condition.  Alert and oriented X 3.  Follow up with [facility] as scheduled.  ?

## 2020-12-18 ENCOUNTER — Non-Acute Institutional Stay (SKILLED_NURSING_FACILITY): Payer: Medicare PPO | Admitting: Adult Health

## 2020-12-18 ENCOUNTER — Encounter: Payer: Self-pay | Admitting: Adult Health

## 2020-12-18 DIAGNOSIS — Z992 Dependence on renal dialysis: Secondary | ICD-10-CM

## 2020-12-18 DIAGNOSIS — Z9181 History of falling: Secondary | ICD-10-CM | POA: Diagnosis not present

## 2020-12-18 DIAGNOSIS — M6281 Muscle weakness (generalized): Secondary | ICD-10-CM | POA: Diagnosis not present

## 2020-12-18 DIAGNOSIS — I120 Hypertensive chronic kidney disease with stage 5 chronic kidney disease or end stage renal disease: Secondary | ICD-10-CM | POA: Diagnosis not present

## 2020-12-18 DIAGNOSIS — E1122 Type 2 diabetes mellitus with diabetic chronic kidney disease: Secondary | ICD-10-CM | POA: Diagnosis not present

## 2020-12-18 DIAGNOSIS — R279 Unspecified lack of coordination: Secondary | ICD-10-CM | POA: Diagnosis not present

## 2020-12-18 DIAGNOSIS — N186 End stage renal disease: Secondary | ICD-10-CM | POA: Diagnosis not present

## 2020-12-18 LAB — KAPPA/LAMBDA LIGHT CHAINS
Kappa free light chain: 181.3 mg/L — ABNORMAL HIGH (ref 3.3–19.4)
Kappa, lambda light chain ratio: 0.8 (ref 0.26–1.65)
Lambda free light chains: 227 mg/L — ABNORMAL HIGH (ref 5.7–26.3)

## 2020-12-18 NOTE — Progress Notes (Signed)
Location:  [place] Room Number: 148-D Place of Service:  SNF (31)   CODE STATUS: DNR  Allergies  Allergen Reactions   Ace Inhibitors Other (See Comments) and Cough    Tongue swell , ie angioedema   Angiotensin Receptor Blockers     Angioedema with ACE-I   Penicillins Other (See Comments)    Unsure of reaction Has patient had a PCN reaction causing immediate rash, facial/tongue/throat swelling, SOB or lightheadedness with hypotension: Unknown Has patient had a PCN reaction causing severe rash involving mucus membranes or skin necrosis: Unknown Has patient had a PCN reaction that required hospitalization: No Has patient had a PCN reaction occurring within the last 10 years: Unknown If all of the above answers are "NO", then may proceed with Cephalosporin use.     Penicillin G    Chief Complaint  Patient presents with   Acute Visit    Diabetes      HPI:  Her cbg readings are less than 150. She is taking lantus 10 units nightly and has novolog 5 units with meald for cbg>150. She has not required novolog.   Past Medical History:  Diagnosis Date   Anemia     chronic macrocytic anemia   Anxiety    Chronic kidney disease    Chronic renal disease, stage 4, severely decreased glomerular filtration rate (GFR) between 15-29 mL/min/1.73 square meter ([place]) 61/60/7371   Complication of anesthesia    delirious after Breast Surgery   Dementia ([place])    mild   Depression    Diabetes mellitus with ESRD (end-stage renal disease) ([place])    type II   Dysphagia    Dyspnea    with activity   GERD (gastroesophageal reflux disease)    Glaucoma    Hyperlipidemia    Hypertension    Pneumonia    Stage 1 infiltrating ductal carcinoma of right female breast ([place]) 08/21/2015   ER+ PR+ HER 2 neu + (3+) T1cN0     Past Surgical History:  Procedure Laterality Date   A/V FISTULAGRAM Right 07/05/2020   Procedure: A/V Fistulagram;  Surgeon: Elam Dutch, MD;   Location: [place] [facility];  Service: Cardiovascular;  Laterality: Right;   AV FISTULA PLACEMENT Left 11/22/2017   Procedure: ARTERIOVENOUS (AV) FISTULA CREATION LEFT ARM;  Surgeon: Elam Dutch, MD;  Location: [facility];  Service: Vascular;  Laterality: Left;   AV FISTULA PLACEMENT Right 04/04/2020   Procedure: RIGHT ARM ARTERIOVENOUS FISTULA CREATION;  Surgeon: Rosetta Posner, MD;  Location: [facility];  Service: Vascular;  Laterality: Right;   AV FISTULA PLACEMENT Right 08/20/2020   Procedure: ARTERIOVENOUS (AV) FISTULA LIGATION RIGHT ARM;  Surgeon: Elam Dutch, MD;  Location[facility];  Service: Vascular;  Laterality: Right;   BIOPSY  08/07/2016   Procedure: BIOPSY;  Surgeon: Daneil Dolin, MD;  [facility];  Service: Endoscopy;;  gastric ulcer biopsy   COLONOSCOPY     ESOPHAGOGASTRODUODENOSCOPY N/A 08/07/2016   LA Grade A esophagitis s/p dilation, small hiatal hernia, multiple gastric ulcers and erosions, duodenal erosions s/p biopsy. Negative H.pylori    ESOPHAGOGASTRODUODENOSCOPY N/A 11/27/2016   normal esophagus, previously noted gastric ulcers completely healed, normal duodenum.    ESOPHAGOGASTRODUODENOSCOPY (EGD) WITH PROPOFOL N/A 07/23/2020   Procedure: ESOPHAGOGASTRODUODENOSCOPY (EGD) WITH PROPOFOL;  Surgeon: Rogene Houston, MD;  [facility];  Service: Endoscopy;  Laterality: N/A;   FISTULA SUPERFICIALIZATION Left 02/14/2018   Procedure: FISTULA SUPERFICIALIZATION LEFT ARM;  Surgeon: Angelia Mould, MD;  Location[facility] OR;  Service: Vascular;  Laterality: Left;   FRACTURE SURGERY Right    ankle   HOT HEMOSTASIS  07/23/2020   Procedure: HOT HEMOSTASIS (ARGON PLASMA COAGULATION/BICAP);  Surgeon: Rogene Houston, MD;  [facility];  Service: Endoscopy;;   INTRAMEDULLARY (IM) NAIL INTERTROCHANTERIC Right 07/12/2020   Procedure: INTRAMEDULLARY (IM) NAIL INTERTROCHANTRIC;  Surgeon: Mordecai Rasmussen, MD;  Location: [facility];  Service: Orthopedics;   Laterality: Right;   MALONEY DILATION N/A 08/07/2016   Procedure: Venia Minks DILATION;  Surgeon: Daneil Dolin, MD;  [facility];  Service: Endoscopy;  Laterality: N/A;   MASTECTOMY, PARTIAL Right    multiple myeloma     PERIPHERAL VASCULAR BALLOON ANGIOPLASTY Left 07/13/2019   Procedure: PERIPHERAL VASCULAR BALLOON ANGIOPLASTY;  Surgeon: Marty Heck, MD;  Location: [place] [facility];  Service: Cardiovascular;  Laterality: Left;  arm fistulogram   PERIPHERAL VASCULAR BALLOON ANGIOPLASTY Right 05/22/2020   Procedure: PERIPHERAL VASCULAR BALLOON ANGIOPLASTY;  Surgeon: Cherre Robins, MD;  Location: [place] [facility];  Service: Cardiovascular;  Laterality: Right;  arm fistula   PERIPHERAL VASCULAR BALLOON ANGIOPLASTY Right 07/05/2020   Procedure: PERIPHERAL VASCULAR BALLOON ANGIOPLASTY;  Surgeon: Elam Dutch, MD;  Location: [place] [facility];  Service: Cardiovascular;  Laterality: Right;  arm fistula   PORT-A-CATH REMOVAL Left 11/22/2017   Procedure: REMOVAL PORT-A-CATH LEFT CHEST;  Surgeon: Elam Dutch, MD;  Location: [place];  Service: Vascular;  Laterality: Left;   RETINAL DETACHMENT SURGERY Right    SCLEROTHERAPY  07/23/2020   Procedure: SCLEROTHERAPY;  Surgeon: Rogene Houston, MD;  [facility];  Service: Endoscopy;;   UPPER EXTREMITY VENOGRAPHY N/A 08/22/2020   Procedure: LEFT UPPER & CENTRAL VENOGRAPHY;  Surgeon: Marty Heck, MD;  Location: [place] [facility];  Service: Cardiovascular;  Laterality: N/A;    Social History   Socioeconomic History   Marital status: Single    Spouse name: Not on file   Number of children: Not on file   Years of education: Not on file   Highest education level: Not on file  Occupational History   Occupation: retired   Tobacco Use   Smoking status: Never   Smokeless tobacco: Never  Vaping Use   Vaping Use: Never used  Substance and Sexual Activity   Alcohol use: No    Alcohol/week: 0.0 standard drinks    Drug use: No   Sexual activity: Never  Other Topics Concern   Not on file  Social History Narrative   Long term resident of SNF    Social Determinants of Health   Financial Resource Strain: Low Risk    Difficulty of Paying Living Expenses: Not very hard  Food Insecurity: No Food Insecurity   Worried About Charity fundraiser in the Last Year: Never true   [place] in the Last Year: Never true  Transportation Needs: No Transportation Needs   Lack of Transportation (Medical): No   Lack of Transportation (Non-Medical): No  Physical Activity: Inactive   Days of Exercise per Week: 0 days   Minutes of Exercise per Session: 0 min  Stress: No Stress Concern Present   Feeling of Stress : Not at all  Social Connections: Moderately Isolated   Frequency of Communication with Friends and Family: More than three times a week   Frequency of Social Gatherings with Friends and Family: Once a week   Attends Religious Services:  More than 4 times per year   Active Member of Clubs or Organizations: No   Attends Archivist Meetings: Never   Marital Status: Never married  Human resources officer Violence: Not At Risk   Fear of Current or Ex-Partner: No   Emotionally Abused: No   Physically Abused: No   Sexually Abused: No   Family History  Problem Relation Age of Onset   Multiple myeloma Sister    Brain cancer Sister    Dementia Mother        died at 26   Stroke Mother    Heart failure Mother    Diabetes Mother    Heart disease Father    Prostate cancer Brother    Colon cancer Neg Hx       VITAL SIGNS BP (!) 152/80   Pulse 72   Temp (!) 97.4 F (36.3 C)   Resp 20   Ht _0  (1.6 m)   Wt 136 lb 9.6 oz (62 kg)   SpO2 96%   BMI 24.20 kg/m   Outpatient Encounter Medications as of 12/18/2020  Medication Sig   acetaminophen (TYLENOL) 650 MG CR tablet Take 650 mg by mouth every 8 (eight) hours.   acyclovir (ZOVIRAX) 200 MG capsule Take 200 mg by mouth in the morning.  (0800)   Amino Acids-Protein Hydrolys (FEEDING SUPPLEMENT, PRO-STAT SUGAR FREE 64,) LIQD Take 30 mLs by mouth 3 (three) times daily with meals. (0800, 1200 & 1800)   amLODipine (NORVASC) 10 MG tablet    anastrozole (ARIMIDEX) 1 MG tablet TAKE 1 TABLET BY MOUTH DAILY   atenolol (TENORMIN) 25 MG tablet Take 1 tablet (25 mg total) by mouth in the morning. (0800)   atorvastatin (LIPITOR) 10 MG tablet Take 10 mg by mouth at bedtime. (2000)   Balsam [place]-Castor Oil (VENELEX) OINT Apply topically. Apply to bilateral buttocks and sacrum every shift.   Calcium Acetate 667 MG TABS Take 667-1,334 mg by mouth See admin instructions. Take 2 tablets (1334 mg) by mouth with each meal (0800 &1730) and take 1 tablet (667 mg) by mouth with each snack (2000)   calcium-vitamin D (OSCAL WITH D) 500-200 MG-UNIT tablet Take 1 tablet by mouth in the morning. (0800)   cloNIDine (CATAPRES) 0.1 MG tablet Take 1 tablet (0.1 mg total) by mouth at bedtime. (2000) Hold for SBP < 110 bpm   insulin glargine (LANTUS SOLOSTAR) 100 UNIT/ML Solostar Pen Inject 10 Units into the skin at bedtime. (2000)   insulin lispro (HUMALOG) 100 UNIT/ML injection Inject 5 Units into the skin 3 (three) times daily with meals. (0800, 1200 & 1800) Give for CBG>150.   melatonin 3 MG TABS tablet Take 6 mg by mouth at bedtime. (2000) For Sleep   multivitamin (RENA-VIT) TABS tablet Take 1 tablet by mouth at bedtime. (2100)   NON FORMULARY Diet-Regular Diet with thin liquids   Nutritional Supplements (FEEDING SUPPLEMENT, NEPRO CARB STEADY,) LIQD Take 237 mLs by mouth at bedtime. (2000)   ondansetron (ZOFRAN) 4 MG tablet Take 1 tablet (4 mg total) by mouth every 6 (six) hours as needed for nausea.   pantoprazole (PROTONIX) 40 MG tablet Take 40 mg by mouth 2 (two) times daily.   sertraline (ZOLOFT) 50 MG tablet Take 50 mg by mouth in the morning. (0800)   sucralfate (CARAFATE) 1 GM/10ML suspension Take 1 g by mouth in the morning, at noon, in the evening,  and at bedtime. (0800, 1200, 1700 & 2100)   [DISCONTINUED] BORTEZOMIB IJ  Inject as directed once a week.   No facility-administered encounter medications on file as of 12/18/2020.     SIGNIFICANT DIAGNOSTIC EXAMS   PREVIOUS   07-11-20: chest x-ray:  Central catheter tip at cavoatrial junction. No pneumothorax. Mild left base atelectasis. No edema or airspace opacity. Stable cardiac silhouette. Aortic Atherosclerosis   07-11-20: right knee x-ray:  No acute fracture or dislocation is noted. Diffuse vascular calcifications are seen. No soft tissue abnormality is noted.  07-11-20: right hip x-ray:  Acute, impacted, angulated right basicervical femoral neck fracture.   07-23-20: upper endoscopy:  Normal hypopharynx. - Normal esophagus. - Z-line irregular, 34 cm from the incisors. - Non-bleeding gastric ulcers with no stigmata of bleeding. - Duodenal ulcer with stigmata of bleed/ pigmented material. Gold probe therapy was not successful and hemostasis achieved with injection of dilute epinephrine. - Non-bleeding duodenal ulcer with no stigmata of bleeding(second bulbar ulcer) - Normal second portion of the duodenum. - No specimens collected.  07-23-20: chest x-ray:No active disease. Aortic Atherosclerosis    NO NEW EXAMS.     LABS REVIEWED PREVIOUS  11-14-19; wbc 8.3; hgb 9.7; hct 31.2 mcv 116.4 plt 261; glucose 198; bun 44; creat 6.66; k+ 5.1; na++ 138; ca 8.8 liver normal albumin 3.9   12-12-19: wbc 7.6; hgb 11.3; hct 35.6; mcv 109.9 plt 212; glucose 602; bun 67; creat 8.62; k+ 5.6; na++ 135; ca 7.9 liver normal albumin 3.5  12-20-19: hgb a1c 8.0  01-09-20: wbc 6.9; hgb 12.1; hct 39.7; mcv 105.6 plt 299; glucose 225; bun 112; creat 14.53; k+ 6.4; na++ 132; ca 8.3 liver normal albumin 3.5  01-23-20: k+ 6.6  01-23-20: wbc 7.9; hgb 11.6; hct 36.9; mcv 103.7 plt 359; glucose 139; bun 109; creat 15.46; k+ 6.1; na++ 140; ca 8.7 liver normal albumin 4.1;  01-24-20: k+ 4.4  01-30-20: wbc 10.6;  hgb 9.7; hct 32.0; mcv 103.6 plt 390; glucose 164; bun 44; creat 9.08; k+ 4.1; na++ 139; ca 9.0 liver normal albumin 3.3 02-06-20: wbc 7.8; hgb 9.9; hct 32.7 mcv 104.8 plt 344; glucose 211; bun 90; creat 10.97; k+ 6.0 na++ 137; ca 8.8 liver normal albumin 3.4  03-26-20: wbc 6.3; hgb 9.4; hct 30.4; mcv 104.1; plt 300; glucose 223; bun 101; creat 14.22; k+ 5.8; na++ 135; ca 8.1 GFR 2; liver normal albumin 3.4  05-21-20; wbc 5.1; hgb 10.4; hct 32.8; mcv 106.8 plt 303; glucose 186; bun 41; creat 1.54; k+ 5.0; na++ 132; ca 8.4 GFR 5; liver normal albumin 3.7 06-13-20: chol 189; ldl 106; trig 211; hdl 41; hgb a1c 6.2   07-11-20: wbc 9.1; hgb 10.6; hct 34.6; mcv 109.1 plt 248; glucose 79; bun 45; creat 7.71 k+ 4.9; na++ 134; ca 8.1 GFR 5; liver normal albumin 3.5 mag 2.4 07-13-20: wbc 11.4; hgb 8.9; hct 29.7; mcv 108.8 plt 303; glucose 178; bun 66; creat 11.17; k+ 6.6; na++ 135; ca 8.2; GFR 3 07-15-20: wbc 11.0; hgb 7.5; hct 25.2; mcv 108.2 plt 251; glucose 117; bun 60; creat 9.41; k+ 5.5; na++ 138; ca 8.4 GFR 4 07-17-20: wbc 11.5; hgb 9.4; hct 29.8; mcv 100.3 plt 300; glucose 140; bun 68; creat 8.04; k+ 4.2; na++ 135; ca 8.5 GFR 5 07-22-20: wbc 11.6; hgb 7.1; hct 23.5; mcv 104.0 plt 444; glucose 214; bun 97; creat 9.18; k+ 4.5; na++ 140; ca 8.0; GFR4 07-24-20: wbc 14.3; hgb 6.7; hct 21.2; mcv 99.5 plt 385; glucose 96; bun 49; creat 6.55; k+ 4.7; na++ 133; ca 8.0; GFR4; mag 2.0  phos 3.8 07-26-20: wbc 10.4; hgb 8.5; hct 26.8; mcv 98.9 plt 430; glucose 111; bun 40;creat 6.90; k+ 4.2; na++ 133; ca 8.5; GFR 6; mag 2.2; phos 3.3    07-30-20: wbc 7.6; hgb 7.3; hct 24.1; mcv 103.4 plt 345; glucose 97; bun 26; creat 5.89; k+ 4.1; na++ 139; ca 8.6; GFR 7; liver normal albumin 2.8.  09-03-20: wbc 5.8; hgb 11.2; hct 37.3; mcv 102.8 plt 274; glucose 175; bun 38; creat 5.72; k+ 3.1; na++ 138; ca 9.2 GFR 7 alk phos 138; albumin 3.4 mag 2.0 09-30-20: wbc 5.6; hgb 13.1; hct 42.6; mcv 97.9 plt 260; glucose 92; bun 23; creat 4.18; k+ 3.1; na++  134; ca 8.7; GFR 10; liver normal albumin 3.8 10-29-20: wbc 5.3; hgb 12.1; hct 40.0; mcv 96.6 plt 218; glucose 190; bun 54; creat 7.35 k+ 4.0; na++ 134; ca 9.4; GFR 5 liver normal albumin 3.7; mag 2.3 11-19-20: wbc 10.9; hgb 11.3; hct 37.2; mcv 96.6 plt 242; glucose 106; bun 47; creat 6.58; k+ 3.9; na++ 133; ca 9.0; GFR 6 liver normal albumin 3.5   TODAY  12-02-20: hgb a1c 5.6; chol 167; ldl 83; trig 229; hdl 38 12-03-20; wbc 5.2; hgb 11.1; hct 35.7; mcv 96.5 plt 320; glucose 139; bun 47; creat 6.05; k+ 4.1; na++ 134; ca 9.1; GFR 6; liver normal albumin 3.8 mag 2.4   Review of Systems  Constitutional:  Negative for malaise/fatigue.  Respiratory:  Negative for cough and shortness of breath.   Cardiovascular:  Negative for chest pain, palpitations and leg swelling.  Gastrointestinal:  Negative for abdominal pain, constipation and heartburn.  Musculoskeletal:  Negative for back pain, joint pain and myalgias.  Skin: Negative.   Neurological:  Negative for dizziness.  Psychiatric/Behavioral:  The patient is not nervous/anxious.    Physical Exam Constitutional:      General: She is not in acute distress.    Appearance: She is well-developed. She is not diaphoretic.  Neck:     Thyroid: No thyromegaly.  Cardiovascular:     Rate and Rhythm: Normal rate and regular rhythm.     Pulses: Normal pulses.     Heart sounds: Normal heart sounds.  Pulmonary:     Effort: Pulmonary effort is normal. No respiratory distress.     Breath sounds: Normal breath sounds.  Chest:  Breasts:    Right: Absent.  Abdominal:     General: Bowel sounds are normal. There is no distension.     Palpations: Abdomen is soft.     Tenderness: There is no abdominal tenderness.  Musculoskeletal:     Cervical back: Neck supple.     Right lower leg: No edema.     Left lower leg: No edema.     Comments: Right hip IM nail 07-12-20    Is able to move all extremities   Lymphadenopathy:     Cervical: No cervical adenopathy.   Skin:    General: Skin is warm and dry.     Comments: Chest wall dialysis access   Neurological:     Mental Status: She is alert. Mental status is at baseline.  Psychiatric:        Mood and Affect: Mood normal.     ASSESSMENT/ PLAN:  TODAY  Type 2 diabetes mellitus with hypertension and end stage renal disease on dialysis  Hgb a1c 5.6 will stop novolog and will lower lantus to 8 units nightly will change cbg to every AM Will monitor her status.    Ok Edwards NP  [place] [phone] Monday through Friday 8am- 5pm  After hours call [phone]

## 2020-12-19 DIAGNOSIS — N186 End stage renal disease: Secondary | ICD-10-CM | POA: Diagnosis not present

## 2020-12-19 DIAGNOSIS — R279 Unspecified lack of coordination: Secondary | ICD-10-CM | POA: Diagnosis not present

## 2020-12-19 DIAGNOSIS — Z9181 History of falling: Secondary | ICD-10-CM | POA: Diagnosis not present

## 2020-12-19 DIAGNOSIS — I120 Hypertensive chronic kidney disease with stage 5 chronic kidney disease or end stage renal disease: Secondary | ICD-10-CM | POA: Insufficient documentation

## 2020-12-19 DIAGNOSIS — Z992 Dependence on renal dialysis: Secondary | ICD-10-CM | POA: Insufficient documentation

## 2020-12-19 DIAGNOSIS — M6281 Muscle weakness (generalized): Secondary | ICD-10-CM | POA: Diagnosis not present

## 2020-12-19 DIAGNOSIS — E1122 Type 2 diabetes mellitus with diabetic chronic kidney disease: Secondary | ICD-10-CM | POA: Insufficient documentation

## 2020-12-19 LAB — PROTEIN ELECTROPHORESIS, SERUM
A/G Ratio: 1.5 (ref 0.7–1.7)
Albumin ELP: 3.7 g/dL (ref 2.9–4.4)
Alpha-1-Globulin: 0.2 g/dL (ref 0.0–0.4)
Alpha-2-Globulin: 0.7 g/dL (ref 0.4–1.0)
Beta Globulin: 0.9 g/dL (ref 0.7–1.3)
Gamma Globulin: 0.5 g/dL (ref 0.4–1.8)
Globulin, Total: 2.4 g/dL (ref 2.2–3.9)
Total Protein ELP: 6.1 g/dL (ref 6.0–8.5)

## 2020-12-19 LAB — IMMUNOFIXATION ELECTROPHORESIS
IgA: 230 mg/dL (ref 64–422)
IgG (Immunoglobin G), Serum: 687 mg/dL (ref 586–1602)
IgM (Immunoglobulin M), Srm: 10 mg/dL — ABNORMAL LOW (ref 26–217)
Total Protein ELP: 6.3 g/dL (ref 6.0–8.5)

## 2020-12-20 DIAGNOSIS — N186 End stage renal disease: Secondary | ICD-10-CM | POA: Diagnosis not present

## 2020-12-20 DIAGNOSIS — Z992 Dependence on renal dialysis: Secondary | ICD-10-CM | POA: Diagnosis not present

## 2020-12-23 DIAGNOSIS — Z9181 History of falling: Secondary | ICD-10-CM | POA: Diagnosis not present

## 2020-12-23 DIAGNOSIS — M6281 Muscle weakness (generalized): Secondary | ICD-10-CM | POA: Diagnosis not present

## 2020-12-23 DIAGNOSIS — R279 Unspecified lack of coordination: Secondary | ICD-10-CM | POA: Diagnosis not present

## 2020-12-23 DIAGNOSIS — N186 End stage renal disease: Secondary | ICD-10-CM | POA: Diagnosis not present

## 2020-12-23 DIAGNOSIS — Z992 Dependence on renal dialysis: Secondary | ICD-10-CM | POA: Diagnosis not present

## 2020-12-24 DIAGNOSIS — R279 Unspecified lack of coordination: Secondary | ICD-10-CM | POA: Diagnosis not present

## 2020-12-24 DIAGNOSIS — M6281 Muscle weakness (generalized): Secondary | ICD-10-CM | POA: Diagnosis not present

## 2020-12-24 DIAGNOSIS — Z9181 History of falling: Secondary | ICD-10-CM | POA: Diagnosis not present

## 2020-12-24 DIAGNOSIS — N186 End stage renal disease: Secondary | ICD-10-CM | POA: Diagnosis not present

## 2020-12-25 DIAGNOSIS — Z9181 History of falling: Secondary | ICD-10-CM | POA: Diagnosis not present

## 2020-12-25 DIAGNOSIS — Z992 Dependence on renal dialysis: Secondary | ICD-10-CM | POA: Diagnosis not present

## 2020-12-25 DIAGNOSIS — R279 Unspecified lack of coordination: Secondary | ICD-10-CM | POA: Diagnosis not present

## 2020-12-25 DIAGNOSIS — N186 End stage renal disease: Secondary | ICD-10-CM | POA: Diagnosis not present

## 2020-12-25 DIAGNOSIS — M6281 Muscle weakness (generalized): Secondary | ICD-10-CM | POA: Diagnosis not present

## 2020-12-26 ENCOUNTER — Other Ambulatory Visit (HOSPITAL_COMMUNITY): Payer: Medicare PPO

## 2020-12-26 ENCOUNTER — Encounter: Payer: Self-pay | Admitting: Adult Health

## 2020-12-26 ENCOUNTER — Non-Acute Institutional Stay (SKILLED_NURSING_FACILITY): Payer: Medicare PPO | Admitting: Adult Health

## 2020-12-26 DIAGNOSIS — F339 Major depressive disorder, recurrent, unspecified: Secondary | ICD-10-CM

## 2020-12-26 DIAGNOSIS — M6281 Muscle weakness (generalized): Secondary | ICD-10-CM | POA: Diagnosis not present

## 2020-12-26 DIAGNOSIS — N186 End stage renal disease: Secondary | ICD-10-CM | POA: Diagnosis not present

## 2020-12-26 DIAGNOSIS — R279 Unspecified lack of coordination: Secondary | ICD-10-CM | POA: Diagnosis not present

## 2020-12-26 DIAGNOSIS — Z9181 History of falling: Secondary | ICD-10-CM | POA: Diagnosis not present

## 2020-12-26 NOTE — Progress Notes (Signed)
Location:  [facility] Room Number: 148 Place of Service:  SNF (31)   CODE STATUS: dnr   Allergies  Allergen Reactions   Ace Inhibitors Other (See Comments) and Cough    Tongue swell , ie angioedema   Angiotensin Receptor Blockers     Angioedema with ACE-I   Penicillins Other (See Comments)    Unsure of reaction Has patient had a PCN reaction causing immediate rash, facial/tongue/throat swelling, SOB or lightheadedness with hypotension: Unknown Has patient had a PCN reaction causing severe rash involving mucus membranes or skin necrosis: Unknown Has patient had a PCN reaction that required hospitalization: No Has patient had a PCN reaction occurring within the last 10 years: Unknown If all of the above answers are "NO", then may proceed with Cephalosporin use.     Penicillin G     Chief Complaint  Patient presents with   Acute Visit    Status changes     HPI:  She is presently taking zoloft 50 mg daily to manage her depression. She continues to go to dialysis three days weekly. She is having increased difficulty tolerating this procedure. She is constantly asking to go back home; and to come off the machine. She is not eating her sandwich but is spitting it out. The staff is concerned that she is becoming more anxious with this treatment. They are wondering if increasing her zoloft would help with her anxiety.   Past Medical History:  Diagnosis Date   Anemia     chronic macrocytic anemia   Anxiety    Chronic kidney disease    Chronic renal disease, stage 4, severely decreased glomerular filtration rate (GFR) between 15-29 mL/min/1.73 square meter (HCC) 08/22/2015   Complication of anesthesia    delirious after Breast Surgery   Dementia (HCC)    mild   Depression    Diabetes mellitus with ESRD (end-stage renal disease) (HCC)    type II   Dysphagia    Dyspnea    with activity   GERD (gastroesophageal reflux disease)    Glaucoma     Hyperlipidemia    Hypertension    Pneumonia    Stage 1 infiltrating ductal carcinoma of right female breast (HCC) 08/21/2015   ER+ PR+ HER 2 neu + (3+) T1cN0     Past Surgical History:  Procedure Laterality Date   A/V FISTULAGRAM Right 07/05/2020   Procedure: A/V Fistulagram;  Surgeon: Sherren Kerns, MD;  [facility]: [facility];  Service: Cardiovascular;  Laterality: Right;   AV FISTULA PLACEMENT Left 11/22/2017   Procedure: ARTERIOVENOUS (AV) FISTULA CREATION LEFT ARM;  Surgeon: Sherren Kerns, MD;  Location: [facility] OR;  Service: Vascular;  Laterality: Left;   AV FISTULA PLACEMENT Right 04/04/2020   Procedure: RIGHT ARM ARTERIOVENOUS FISTULA CREATION;  Surgeon: Larina Earthly, MD;  Location: [facility] ORS;  Service: Vascular;  Laterality: Right;   AV FISTULA PLACEMENT Right 08/20/2020   Procedure: ARTERIOVENOUS (AV) FISTULA LIGATION RIGHT ARM;  Surgeon: Sherren Kerns, MD;  Location: [facility] OR;  Service: Vascular;  Laterality: Right;   BIOPSY  08/07/2016   Procedure: BIOPSY;  Surgeon: Corbin Ade, MD;  Location: [facility];  Service: Endoscopy;;  gastric ulcer biopsy   COLONOSCOPY     ESOPHAGOGASTRODUODENOSCOPY N/A 08/07/2016   LA Grade A esophagitis s/p dilation, small hiatal hernia, multiple gastric ulcers and erosions, duodenal erosions s/p biopsy. Negative H.pylori    ESOPHAGOGASTRODUODENOSCOPY N/A 11/27/2016   normal esophagus, previously  noted gastric ulcers completely healed, normal duodenum.    ESOPHAGOGASTRODUODENOSCOPY (EGD) WITH PROPOFOL N/A 07/23/2020   Procedure: ESOPHAGOGASTRODUODENOSCOPY (EGD) WITH PROPOFOL;  Surgeon: Rogene Houston, MD;  [facility]: [facility];  Service: Endoscopy;  Laterality: N/A;   FISTULA SUPERFICIALIZATION Left 02/14/2018   Procedure: FISTULA SUPERFICIALIZATION LEFT ARM;  Surgeon: Angelia Mould, MD;  Location: [facility] OR;  Service: Vascular;  Laterality: Left;   FRACTURE SURGERY Right    ankle   HOT HEMOSTASIS  07/23/2020   Procedure: HOT  HEMOSTASIS (ARGON PLASMA COAGULATION/BICAP);  Surgeon: Rogene Houston, MD;  [facility]: [facility];  Service: Endoscopy;;   INTRAMEDULLARY (IM) NAIL INTERTROCHANTERIC Right 07/12/2020   Procedure: INTRAMEDULLARY (IM) NAIL INTERTROCHANTRIC;  Surgeon: Mordecai Rasmussen, MD;  Location: [facility];  Service: Orthopedics;  Laterality: Right;   MALONEY DILATION N/A 08/07/2016   Procedure: Venia Minks DILATION;  Surgeon: Daneil Dolin, MD;  [facility]: [facility];  Service: Endoscopy;  Laterality: N/A;   MASTECTOMY, PARTIAL Right    multiple myeloma     PERIPHERAL VASCULAR BALLOON ANGIOPLASTY Left 07/13/2019   Procedure: PERIPHERAL VASCULAR BALLOON ANGIOPLASTY;  Surgeon: Marty Heck, MD;  Location: [place] CV LAB;  Service: Cardiovascular;  Laterality: Left;  arm fistulogram   PERIPHERAL VASCULAR BALLOON ANGIOPLASTY Right 05/22/2020   Procedure: PERIPHERAL VASCULAR BALLOON ANGIOPLASTY;  Surgeon: Cherre Robins, MD;  Location: [place] [facility] LAB;  Service: Cardiovascular;  Laterality: Right;  arm fistula   PERIPHERAL VASCULAR BALLOON ANGIOPLASTY Right 07/05/2020   Procedure: PERIPHERAL VASCULAR BALLOON ANGIOPLASTY;  Surgeon: Elam Dutch, MD;  Location: [place] CV LAB;  Service: Cardiovascular;  Laterality: Right;  arm fistula   PORT-A-CATH REMOVAL Left 11/22/2017   Procedure: REMOVAL PORT-A-CATH LEFT CHEST;  Surgeon: Elam Dutch, MD;  Location: [place];  Service: Vascular;  Laterality: Left;   RETINAL DETACHMENT SURGERY Right    SCLEROTHERAPY  07/23/2020   Procedure: SCLEROTHERAPY;  Surgeon: Rogene Houston, MD;  [facility];  Service: Endoscopy;;   UPPER EXTREMITY VENOGRAPHY N/A 08/22/2020   Procedure: LEFT UPPER & CENTRAL VENOGRAPHY;  Surgeon: Marty Heck, MD;  Location: [place] CV LAB;  Service: Cardiovascular;  Laterality: N/A;    Social History   Socioeconomic History   Marital status: Single    Spouse name: Not on file   Number of children: Not on file    Years of education: Not on file   Highest education level: Not on file  Occupational History   Occupation: retired   Tobacco Use   Smoking status: Never   Smokeless tobacco: Never  Vaping Use   Vaping Use: Never used  Substance and Sexual Activity   Alcohol use: No    Alcohol/week: 0.0 standard drinks   Drug use: No   Sexual activity: Never  Other Topics Concern   Not on file  Social History Narrative   Long term resident of SNF    Social Determinants of Health   Financial Resource Strain: Low Risk    Difficulty of Paying Living Expenses: Not very hard  Food Insecurity: No Food Insecurity   Worried About Charity fundraiser in the Last Year: Never true   [place] in the Last Year: Never true  Transportation Needs: No Transportation Needs   Lack of Transportation (Medical): No   Lack of Transportation (Non-Medical): No  Physical Activity: Inactive   Days of Exercise per Week: 0 days   Minutes of Exercise per Session: 0  min  Stress: No Stress Concern Present   Feeling of Stress : Not at all  Social Connections: Moderately Isolated   Frequency of Communication with Friends and Family: More than three times a week   Frequency of Social Gatherings with Friends and Family: Once a week   Attends Religious Services: More than 4 times per year   Active Member of [employer] or Organizations: No   Attends Music therapist: Never   Marital Status: Never married  Human resources officer Violence: Not At Risk   Fear of Current or Ex-Partner: No   Emotionally Abused: No   Physically Abused: No   Sexually Abused: No   Family History  Problem Relation Age of Onset   Multiple myeloma Sister    Brain cancer Sister    Dementia Mother        died at 50   Stroke Mother    Heart failure Mother    Diabetes Mother    Heart disease Father    Prostate cancer Brother    Colon cancer Neg Hx       VITAL SIGNS BP 128/64   Pulse 72   Temp (!) 97.5 F (36.4 C)   Ht $R'5\' 3"'IP$   (1.6 m)   Wt 136 lb 9.6 oz (62 kg)   BMI 24.20 kg/m   Outpatient Encounter Medications as of 12/26/2020  Medication Sig   acetaminophen (TYLENOL) 650 MG CR tablet Take 650 mg by mouth every 8 (eight) hours.   acyclovir (ZOVIRAX) 200 MG capsule Take 200 mg by mouth in the morning. (0800)   Amino Acids-Protein Hydrolys (FEEDING SUPPLEMENT, PRO-STAT SUGAR FREE 64,) LIQD Take 30 mLs by mouth 3 (three) times daily with meals. (0800, 1200 & 1800)   amLODipine (NORVASC) 10 MG tablet    anastrozole (ARIMIDEX) 1 MG tablet TAKE 1 TABLET BY MOUTH DAILY   atenolol (TENORMIN) 25 MG tablet Take 1 tablet (25 mg total) by mouth in the morning. (0800)   atorvastatin (LIPITOR) 10 MG tablet Take 10 mg by mouth at bedtime. (2000)   Balsam [place]-Castor Oil (VENELEX) OINT Apply topically. Apply to bilateral buttocks and sacrum every shift.   Calcium Acetate 667 MG TABS Take 667-1,334 mg by mouth See admin instructions. Take 2 tablets (1334 mg) by mouth with each meal (0800 &1730) and take 1 tablet (667 mg) by mouth with each snack (2000)   calcium-vitamin D (OSCAL WITH D) 500-200 MG-UNIT tablet Take 1 tablet by mouth in the morning. (0800)   cloNIDine (CATAPRES) 0.1 MG tablet Take 1 tablet (0.1 mg total) by mouth at bedtime. (2000) Hold for SBP < 110 bpm   insulin glargine (LANTUS SOLOSTAR) 100 UNIT/ML Solostar Pen Inject 10 Units into the skin at bedtime. (2000)   insulin lispro (HUMALOG) 100 UNIT/ML injection Inject 5 Units into the skin 3 (three) times daily with meals. (0800, 1200 & 1800) Give for CBG>150.   melatonin 3 MG TABS tablet Take 6 mg by mouth at bedtime. (2000) For Sleep   multivitamin (RENA-VIT) TABS tablet Take 1 tablet by mouth at bedtime. (2100)   NON FORMULARY Diet-Regular Diet with thin liquids   Nutritional Supplements (FEEDING SUPPLEMENT, NEPRO CARB STEADY,) LIQD Take 237 mLs by mouth at bedtime. (2000)   ondansetron (ZOFRAN) 4 MG tablet Take 1 tablet (4 mg total) by mouth every 6 (six) hours  as needed for nausea.   pantoprazole (PROTONIX) 40 MG tablet Take 40 mg by mouth 2 (two) times daily.   sertraline (ZOLOFT)  50 MG tablet Take 50 mg by mouth in the morning. (0800)   sucralfate (CARAFATE) 1 GM/10ML suspension Take 1 g by mouth in the morning, at noon, in the evening, and at bedtime. (0800, 1200, 1700 & 2100)   No facility-administered encounter medications on file as of 12/26/2020.     SIGNIFICANT DIAGNOSTIC EXAMS  PREVIOUS   07-11-20: chest x-ray:  Central catheter tip at cavoatrial junction. No pneumothorax. Mild left base atelectasis. No edema or airspace opacity. Stable cardiac silhouette. Aortic Atherosclerosis   07-11-20: right knee x-ray:  No acute fracture or dislocation is noted. Diffuse vascular calcifications are seen. No soft tissue abnormality is noted.  07-11-20: right hip x-ray:  Acute, impacted, angulated right basicervical femoral neck fracture.   07-23-20: upper endoscopy:  Normal hypopharynx. - Normal esophagus. - Z-line irregular, 34 cm from the incisors. - Non-bleeding gastric ulcers with no stigmata of bleeding. - Duodenal ulcer with stigmata of bleed/ pigmented material. Gold probe therapy was not successful and hemostasis achieved with injection of dilute epinephrine. - Non-bleeding duodenal ulcer with no stigmata of bleeding(second bulbar ulcer) - Normal second portion of the duodenum. - No specimens collected.  07-23-20: chest x-ray:No active disease. Aortic Atherosclerosis    NO NEW EXAMS.     LABS REVIEWED PREVIOUS  12-12-19: wbc 7.6; hgb 11.3; hct 35.6; mcv 109.9 plt 212; glucose 602; bun 67; creat 8.62; k+ 5.6; na++ 135; ca 7.9 liver normal albumin 3.5  12-20-19: hgb a1c 8.0  01-09-20: wbc 6.9; hgb 12.1; hct 39.7; mcv 105.6 plt 299; glucose 225; bun 112; creat 14.53; k+ 6.4; na++ 132; ca 8.3 liver normal albumin 3.5  01-23-20: k+ 6.6  01-23-20: wbc 7.9; hgb 11.6; hct 36.9; mcv 103.7 plt 359; glucose 139; bun 109; creat 15.46; k+ 6.1;  na++ 140; ca 8.7 liver normal albumin 4.1;  01-24-20: k+ 4.4  01-30-20: wbc 10.6; hgb 9.7; hct 32.0; mcv 103.6 plt 390; glucose 164; bun 44; creat 9.08; k+ 4.1; na++ 139; ca 9.0 liver normal albumin 3.3 02-06-20: wbc 7.8; hgb 9.9; hct 32.7 mcv 104.8 plt 344; glucose 211; bun 90; creat 10.97; k+ 6.0 na++ 137; ca 8.8 liver normal albumin 3.4  03-26-20: wbc 6.3; hgb 9.4; hct 30.4; mcv 104.1; plt 300; glucose 223; bun 101; creat 14.22; k+ 5.8; na++ 135; ca 8.1 GFR 2; liver normal albumin 3.4  05-21-20; wbc 5.1; hgb 10.4; hct 32.8; mcv 106.8 plt 303; glucose 186; bun 41; creat 1.54; k+ 5.0; na++ 132; ca 8.4 GFR 5; liver normal albumin 3.7 06-13-20: chol 189; ldl 106; trig 211; hdl 41; hgb a1c 6.2   07-11-20: wbc 9.1; hgb 10.6; hct 34.6; mcv 109.1 plt 248; glucose 79; bun 45; creat 7.71 k+ 4.9; na++ 134; ca 8.1 GFR 5; liver normal albumin 3.5 mag 2.4 07-13-20: wbc 11.4; hgb 8.9; hct 29.7; mcv 108.8 plt 303; glucose 178; bun 66; creat 11.17; k+ 6.6; na++ 135; ca 8.2; GFR 3 07-15-20: wbc 11.0; hgb 7.5; hct 25.2; mcv 108.2 plt 251; glucose 117; bun 60; creat 9.41; k+ 5.5; na++ 138; ca 8.4 GFR 4 07-17-20: wbc 11.5; hgb 9.4; hct 29.8; mcv 100.3 plt 300; glucose 140; bun 68; creat 8.04; k+ 4.2; na++ 135; ca 8.5 GFR 5 07-22-20: wbc 11.6; hgb 7.1; hct 23.5; mcv 104.0 plt 444; glucose 214; bun 97; creat 9.18; k+ 4.5; na++ 140; ca 8.0; GFR4 07-24-20: wbc 14.3; hgb 6.7; hct 21.2; mcv 99.5 plt 385; glucose 96; bun 49; creat 6.55; k+ 4.7; na++ 133; ca  8.0; GFR4; mag 2.0 phos 3.8 07-26-20: wbc 10.4; hgb 8.5; hct 26.8; mcv 98.9 plt 430; glucose 111; bun 40;creat 6.90; k+ 4.2; na++ 133; ca 8.5; GFR 6; mag 2.2; phos 3.3    07-30-20: wbc 7.6; hgb 7.3; hct 24.1; mcv 103.4 plt 345; glucose 97; bun 26; creat 5.89; k+ 4.1; na++ 139; ca 8.6; GFR 7; liver normal albumin 2.8.  09-03-20: wbc 5.8; hgb 11.2; hct 37.3; mcv 102.8 plt 274; glucose 175; bun 38; creat 5.72; k+ 3.1; na++ 138; ca 9.2 GFR 7 alk phos 138; albumin 3.4 mag 2.0 09-30-20: wbc  5.6; hgb 13.1; hct 42.6; mcv 97.9 plt 260; glucose 92; bun 23; creat 4.18; k+ 3.1; na++ 134; ca 8.7; GFR 10; liver normal albumin 3.8 10-29-20: wbc 5.3; hgb 12.1; hct 40.0; mcv 96.6 plt 218; glucose 190; bun 54; creat 7.35 k+ 4.0; na++ 134; ca 9.4; GFR 5 liver normal albumin 3.7; mag 2.3 11-19-20: wbc 10.9; hgb 11.3; hct 37.2; mcv 96.6 plt 242; glucose 106; bun 47; creat 6.58; k+ 3.9; na++ 133; ca 9.0; GFR 6 liver normal albumin 3.5  12-02-20: hgb a1c 5.6; chol 167; ldl 83; trig 229; hdl 38 12-03-20; wbc 5.2; hgb 11.1; hct 35.7; mcv 96.5 plt 320; glucose 139; bun 47; creat 6.05; k+ 4.1; na++ 134; ca 9.1; GFR 6; liver normal albumin 3.8 mag 2.4   NO NEW LABS.   Review of Systems  Constitutional:  Negative for malaise/fatigue.  Respiratory:  Negative for cough and shortness of breath.   Cardiovascular:  Negative for chest pain, palpitations and leg swelling.  Gastrointestinal:  Negative for abdominal pain, constipation and heartburn.  Musculoskeletal:  Negative for back pain, joint pain and myalgias.  Skin: Negative.   Neurological:  Negative for dizziness.  Psychiatric/Behavioral:  Positive for depression. The patient is not nervous/anxious.     Physical Exam Constitutional:      General: She is not in acute distress.    Appearance: She is well-developed. She is not diaphoretic.  Neck:     Thyroid: No thyromegaly.  Cardiovascular:     Rate and Rhythm: Normal rate and regular rhythm.     Heart sounds: Normal heart sounds.  Pulmonary:     Effort: Pulmonary effort is normal. No respiratory distress.     Breath sounds: Normal breath sounds.  Chest:  Breasts:    Right: Absent.  Abdominal:     General: Bowel sounds are normal. There is no distension.     Palpations: Abdomen is soft.     Tenderness: There is no abdominal tenderness.  Musculoskeletal:     Cervical back: Neck supple.     Right lower leg: No edema.     Left lower leg: No edema.     Comments: Right hip IM nail 07-12-20    Is  able to move all extremities    Lymphadenopathy:     Cervical: No cervical adenopathy.  Skin:    General: Skin is warm and dry.     Comments:  Chest wall dialysis access   Neurological:     Mental Status: She is alert. Mental status is at baseline.  Psychiatric:        Mood and Affect: Mood normal.      ASSESSMENT/ PLAN:  TODAY:   Major depression chronic recurrent: is worse; will increase her zoloft to 100 mg daily and will monitor her status.    Synthia Innocent NP [facility]  Contact [phone] Monday through Friday 8am- 5pm  After hours call [phone]

## 2020-12-27 DIAGNOSIS — Z9181 History of falling: Secondary | ICD-10-CM | POA: Diagnosis not present

## 2020-12-27 DIAGNOSIS — N186 End stage renal disease: Secondary | ICD-10-CM | POA: Diagnosis not present

## 2020-12-27 DIAGNOSIS — M6281 Muscle weakness (generalized): Secondary | ICD-10-CM | POA: Diagnosis not present

## 2020-12-27 DIAGNOSIS — Z992 Dependence on renal dialysis: Secondary | ICD-10-CM | POA: Diagnosis not present

## 2020-12-27 DIAGNOSIS — R279 Unspecified lack of coordination: Secondary | ICD-10-CM | POA: Diagnosis not present

## 2020-12-30 DIAGNOSIS — M6281 Muscle weakness (generalized): Secondary | ICD-10-CM | POA: Diagnosis not present

## 2020-12-30 DIAGNOSIS — R279 Unspecified lack of coordination: Secondary | ICD-10-CM | POA: Diagnosis not present

## 2020-12-30 DIAGNOSIS — Z992 Dependence on renal dialysis: Secondary | ICD-10-CM | POA: Diagnosis not present

## 2020-12-30 DIAGNOSIS — Z9181 History of falling: Secondary | ICD-10-CM | POA: Diagnosis not present

## 2020-12-30 DIAGNOSIS — N186 End stage renal disease: Secondary | ICD-10-CM | POA: Diagnosis not present

## 2020-12-31 ENCOUNTER — Other Ambulatory Visit (HOSPITAL_COMMUNITY)
Admission: RE | Admit: 2020-12-31 | Discharge: 2020-12-31 | Disposition: A | Discharge status: Home / Self Care | Payer: Medicare PPO | Source: Skilled Nursing Facility | Attending: Adult Health | Admitting: Adult Health

## 2020-12-31 ENCOUNTER — Encounter: Payer: Self-pay | Admitting: Adult Health

## 2020-12-31 ENCOUNTER — Ambulatory Visit (HOSPITAL_COMMUNITY): Payer: Medicare PPO

## 2020-12-31 ENCOUNTER — Inpatient Hospital Stay (HOSPITAL_COMMUNITY): Payer: Medicare PPO

## 2020-12-31 ENCOUNTER — Ambulatory Visit (HOSPITAL_COMMUNITY): Payer: Medicare PPO | Admitting: Hematology

## 2020-12-31 ENCOUNTER — Non-Acute Institutional Stay (SKILLED_NURSING_FACILITY): Payer: Medicare PPO | Admitting: Adult Health

## 2020-12-31 DIAGNOSIS — S72001S Fracture of unspecified part of neck of right femur, sequela: Secondary | ICD-10-CM

## 2020-12-31 DIAGNOSIS — E1122 Type 2 diabetes mellitus with diabetic chronic kidney disease: Secondary | ICD-10-CM | POA: Diagnosis not present

## 2020-12-31 DIAGNOSIS — M6281 Muscle weakness (generalized): Secondary | ICD-10-CM | POA: Diagnosis not present

## 2020-12-31 DIAGNOSIS — K219 Gastro-esophageal reflux disease without esophagitis: Secondary | ICD-10-CM

## 2020-12-31 DIAGNOSIS — B963 Hemophilus influenzae [H. influenzae] as the cause of diseases classified elsewhere: Secondary | ICD-10-CM | POA: Diagnosis not present

## 2020-12-31 DIAGNOSIS — I120 Hypertensive chronic kidney disease with stage 5 chronic kidney disease or end stage renal disease: Secondary | ICD-10-CM

## 2020-12-31 DIAGNOSIS — K269 Duodenal ulcer, unspecified as acute or chronic, without hemorrhage or perforation: Secondary | ICD-10-CM | POA: Diagnosis not present

## 2020-12-31 DIAGNOSIS — N186 End stage renal disease: Secondary | ICD-10-CM | POA: Diagnosis not present

## 2020-12-31 DIAGNOSIS — Z9181 History of falling: Secondary | ICD-10-CM | POA: Diagnosis not present

## 2020-12-31 DIAGNOSIS — Z992 Dependence on renal dialysis: Secondary | ICD-10-CM

## 2020-12-31 DIAGNOSIS — R279 Unspecified lack of coordination: Secondary | ICD-10-CM | POA: Diagnosis not present

## 2020-12-31 DIAGNOSIS — K259 Gastric ulcer, unspecified as acute or chronic, without hemorrhage or perforation: Secondary | ICD-10-CM | POA: Diagnosis not present

## 2020-12-31 DIAGNOSIS — Z20822 Contact with and (suspected) exposure to covid-19: Secondary | ICD-10-CM | POA: Diagnosis not present

## 2020-12-31 LAB — RESP PANEL BY RT-PCR (FLU A&B, COVID) ARPGX2
Influenza A by PCR: NEGATIVE
Influenza B by PCR: NEGATIVE
SARS Coronavirus 2 by RT PCR: NEGATIVE

## 2020-12-31 NOTE — Progress Notes (Signed)
Location:  [facility] Room Number: 148-D Place of Service:  SNF (31)   CODE STATUS: DNR  Allergies  Allergen Reactions   Ace Inhibitors Other (See Comments) and Cough    Tongue swell , ie angioedema   Angiotensin Receptor Blockers     Angioedema with ACE-I   Penicillins Other (See Comments)    Unsure of reaction Has patient had a PCN reaction causing immediate rash, facial/tongue/throat swelling, SOB or lightheadedness with hypotension: Unknown Has patient had a PCN reaction causing severe rash involving mucus membranes or skin necrosis: Unknown Has patient had a PCN reaction that required hospitalization: No Has patient had a PCN reaction occurring within the last 10 years: Unknown If all of the above answers are "NO", then may proceed with Cephalosporin use.     Penicillin G     Chief Complaint  Patient presents with   Medical Management of Chronic Issues                   Multiple gastric ulcers: duodenal ulcer: upper GI bleed: esophageal reflux disease without esophagitis:    Closed right hip fracture sequela:  Chronic kidney disease with end stage renal disease on dialysis due to type 2 diabetes mellitus/dependence on dialysis:  Hypertension due to end stage renal disease (ESRD) due to type 2 diabetes mellitus    HPI:  She is a 85 year old long term resident of this facility being seen for the management of her chronic illnesses: Multiple gastric ulcers: duodenal ulcer: upper GI bleed: esophageal reflux disease without esophagitis:    Closed right hip fracture sequela:  Chronic kidney disease with end stage renal disease on dialysis due to type 2 diabetes mellitus/dependence on dialysis:  Hypertension due to end stage renal disease (ESRD) due to type 2 diabetes mellitus. There are no reports of uncontrolled pain. No reports of anxiety or depressive thoughts.   Past Medical History:  Diagnosis Date   Anemia     chronic macrocytic anemia   Anxiety     Chronic kidney disease    Chronic renal disease, stage 4, severely decreased glomerular filtration rate (GFR) between 15-29 mL/min/1.73 square meter ([place]) 32/67/1245   Complication of anesthesia    delirious after Breast Surgery   Dementia ([place])    mild   Depression    Diabetes mellitus with ESRD (end-stage renal disease) ([place])    type II   Dysphagia    Dyspnea    with activity   GERD (gastroesophageal reflux disease)    Glaucoma    Hyperlipidemia    Hypertension    Pneumonia    Stage 1 infiltrating ductal carcinoma of right female breast ([place]) 08/21/2015   ER+ PR+ HER 2 neu + (3+) T1cN0     Past Surgical History:  Procedure Laterality Date   A/V FISTULAGRAM Right 07/05/2020   Procedure: A/V Fistulagram;  Surgeon: Elam Dutch, MD;  Location: [facility];  Service: Cardiovascular;  Laterality: Right;   AV FISTULA PLACEMENT Left 11/22/2017   Procedure: ARTERIOVENOUS (AV) FISTULA CREATION LEFT ARM;  Surgeon: Elam Dutch, MD;  Location: [facility] OR;  Service: Vascular;  Laterality: Left;   AV FISTULA PLACEMENT Right 04/04/2020   Procedure: RIGHT ARM ARTERIOVENOUS FISTULA CREATION;  Surgeon: Rosetta Posner, MD;  Location: [facility];  Service: Vascular;  Laterality: Right;   AV FISTULA PLACEMENT Right 08/20/2020   Procedure: ARTERIOVENOUS (AV) FISTULA LIGATION RIGHT ARM;  Surgeon: Elam Dutch, MD;  Location: [facility];  Service: Vascular;  Laterality: Right;   BIOPSY  08/07/2016   Procedure: BIOPSY;  Surgeon: Daneil Dolin, MD;  [facility];  Service: Endoscopy;;  gastric ulcer biopsy   COLONOSCOPY     ESOPHAGOGASTRODUODENOSCOPY N/A 08/07/2016   LA Grade A esophagitis s/p dilation, small hiatal hernia, multiple gastric ulcers and erosions, duodenal erosions s/p biopsy. Negative H.pylori    ESOPHAGOGASTRODUODENOSCOPY N/A 11/27/2016   normal esophagus, previously noted gastric ulcers completely healed, normal duodenum.    ESOPHAGOGASTRODUODENOSCOPY (EGD) WITH PROPOFOL  N/A 07/23/2020   Procedure: ESOPHAGOGASTRODUODENOSCOPY (EGD) WITH PROPOFOL;  Surgeon: Rogene Houston, MD;  [facility];  Service: Endoscopy;  Laterality: N/A;   FISTULA SUPERFICIALIZATION Left 02/14/2018   Procedure: FISTULA SUPERFICIALIZATION LEFT ARM;  Surgeon: Angelia Mould, MD;  Location: [facility] OR;  Service: Vascular;  Laterality: Left;   FRACTURE SURGERY Right    ankle   HOT HEMOSTASIS  07/23/2020   Procedure: HOT HEMOSTASIS (ARGON PLASMA COAGULATION/BICAP);  Surgeon: Rogene Houston, MD;  [facility];  Service: Endoscopy;;   INTRAMEDULLARY (IM) NAIL INTERTROCHANTERIC Right 07/12/2020   Procedure: INTRAMEDULLARY (IM) NAIL INTERTROCHANTRIC;  Surgeon: Mordecai Rasmussen, MD;  Location: [facility];  Service: Orthopedics;  Laterality: Right;   MALONEY DILATION N/A 08/07/2016   Procedure: Venia Minks DILATION;  Surgeon: Daneil Dolin, MD;  [facility];  Service: Endoscopy;  Laterality: N/A;   MASTECTOMY, PARTIAL Right    multiple myeloma     PERIPHERAL VASCULAR BALLOON ANGIOPLASTY Left 07/13/2019   Procedure: PERIPHERAL VASCULAR BALLOON ANGIOPLASTY;  Surgeon: Marty Heck, MD;  Location: [facility];  Service: Cardiovascular;  Laterality: Left;  arm fistulogram   PERIPHERAL VASCULAR BALLOON ANGIOPLASTY Right 05/22/2020   Procedure: PERIPHERAL VASCULAR BALLOON ANGIOPLASTY;  Surgeon: Cherre Robins, MD;  Location: [facility];  Service: Cardiovascular;  Laterality: Right;  arm fistula   PERIPHERAL VASCULAR BALLOON ANGIOPLASTY Right 07/05/2020   Procedure: PERIPHERAL VASCULAR BALLOON ANGIOPLASTY;  Surgeon: Elam Dutch, MD;  Location: [facility];  Service: Cardiovascular;  Laterality: Right;  arm fistula   PORT-A-CATH REMOVAL Left 11/22/2017   Procedure: REMOVAL PORT-A-CATH LEFT CHEST;  Surgeon: Elam Dutch, MD;  Location: [facility];  Service: Vascular;  Laterality: Left;   RETINAL DETACHMENT SURGERY Right    SCLEROTHERAPY  07/23/2020    Procedure: SCLEROTHERAPY;  Surgeon: Rogene Houston, MD;  [facility];  Service: Endoscopy;;   UPPER EXTREMITY VENOGRAPHY N/A 08/22/2020   Procedure: LEFT UPPER & CENTRAL VENOGRAPHY;  Surgeon: Marty Heck, MD;  Location: [facility];  Service: Cardiovascular;  Laterality: N/A;    Social History   Socioeconomic History   Marital status: Single    Spouse name: Not on file   Number of children: Not on file   Years of education: Not on file   Highest education level: Not on file  Occupational History   Occupation: retired   Tobacco Use   Smoking status: Never   Smokeless tobacco: Never  Vaping Use   Vaping Use: Never used  Substance and Sexual Activity   Alcohol use: No    Alcohol/week: 0.0 standard drinks   Drug use: No   Sexual activity: Never  Other Topics Concern   Not on file  Social History Narrative   Long term resident of SNF    Social Determinants of Health   Financial Resource Strain: Low Risk    Difficulty of  Paying Living Expenses: Not very hard  Food Insecurity: No Food Insecurity   Worried About [place] in the Last Year: Never true   Ran Out of Food in the Last Year: Never true  Transportation Needs: No Transportation Needs   Lack of Transportation (Medical): No   Lack of Transportation (Non-Medical): No  Physical Activity: Inactive   Days of Exercise per Week: 0 days   Minutes of Exercise per Session: 0 min  Stress: No Stress Concern Present   Feeling of Stress : Not at all  Social Connections: Moderately Isolated   Frequency of Communication with Friends and Family: More than three times a week   Frequency of Social Gatherings with Friends and Family: Once a week   Attends Religious Services: More than 4 times per year   Active Member of [employer] or Organizations: No   Attends Archivist Meetings: Never   Marital Status: Never married  Human resources officer Violence: Not At Risk   Fear of Current or  Ex-Partner: No   Emotionally Abused: No   Physically Abused: No   Sexually Abused: No   Family History  Problem Relation Age of Onset   Multiple myeloma Sister    Brain cancer Sister    Dementia Mother        died at 59   Stroke Mother    Heart failure Mother    Diabetes Mother    Heart disease Father    Prostate cancer Brother    Colon cancer Neg Hx       VITAL SIGNS BP 134/71   Pulse 75   Temp 99.2 F (37.3 C)   Resp 20   Ht '5\' 3"'  (1.6 m)   Wt 136 lb 9.6 oz (62 kg)   SpO2 98%   BMI 24.20 kg/m   Outpatient Encounter Medications as of 12/31/2020  Medication Sig   acetaminophen (TYLENOL) 650 MG CR tablet Take 650 mg by mouth every 8 (eight) hours.   acyclovir (ZOVIRAX) 200 MG capsule Take 200 mg by mouth in the morning. (0800)   Amino Acids-Protein Hydrolys (FEEDING SUPPLEMENT, PRO-STAT SUGAR FREE 64,) LIQD Take 30 mLs by mouth 3 (three) times daily with meals. (0800, 1200 & 1800)   anastrozole (ARIMIDEX) 1 MG tablet TAKE 1 TABLET BY MOUTH DAILY   atenolol (TENORMIN) 25 MG tablet Take 1 tablet (25 mg total) by mouth in the morning. (0800)   atorvastatin (LIPITOR) 10 MG tablet Take 10 mg by mouth at bedtime. (2000)   Balsam [place]-Castor Oil (VENELEX) OINT Apply topically. Apply to bilateral buttocks and sacrum every shift.   Calcium Acetate 667 MG TABS Take 667-1,334 mg by mouth See admin instructions. Take 2 tablets (1334 mg) by mouth with each meal (0800 &1730) and take 1 tablet (667 mg) by mouth with each snack (2000)   calcium-vitamin D (OSCAL WITH D) 500-200 MG-UNIT tablet Take 1 tablet by mouth in the morning. (0800)   cloNIDine (CATAPRES) 0.1 MG tablet Take 1 tablet (0.1 mg total) by mouth at bedtime. (2000) Hold for SBP < 110 bpm   insulin glargine (LANTUS SOLOSTAR) 100 UNIT/ML Solostar Pen Inject 10 Units into the skin at bedtime. (2000)   melatonin 3 MG TABS tablet Take 6 mg by mouth at bedtime. (2000) For Sleep   multivitamin (RENA-VIT) TABS tablet Take 1 tablet  by mouth at bedtime. (2100)   NON FORMULARY Diet-Regular Diet with thin liquids   ondansetron (ZOFRAN) 4 MG tablet Take 1 tablet (  4 mg total) by mouth every 6 (six) hours as needed for nausea.   pantoprazole (PROTONIX) 40 MG tablet Take 40 mg by mouth 2 (two) times daily.   sucralfate (CARAFATE) 1 GM/10ML suspension Take 1 g by mouth in the morning, at noon, in the evening, and at bedtime. (0800, 1200, 1700 & 2100)   [DISCONTINUED] amLODipine (NORVASC) 10 MG tablet    [DISCONTINUED] insulin lispro (HUMALOG) 100 UNIT/ML injection Inject 5 Units into the skin 3 (three) times daily with meals. (0800, 1200 & 1800) Give for CBG>150.   [DISCONTINUED] Nutritional Supplements (FEEDING SUPPLEMENT, NEPRO CARB STEADY,) LIQD Take 237 mLs by mouth at bedtime. (2000)   [DISCONTINUED] sertraline (ZOLOFT) 100 MG tablet Take 100 mg by mouth daily.   No facility-administered encounter medications on file as of 12/31/2020.     SIGNIFICANT DIAGNOSTIC EXAMS  PREVIOUS   07-11-20: chest x-ray:  Central catheter tip at cavoatrial junction. No pneumothorax. Mild left base atelectasis. No edema or airspace opacity. Stable cardiac silhouette. Aortic Atherosclerosis   07-11-20: right knee x-ray:  No acute fracture or dislocation is noted. Diffuse vascular calcifications are seen. No soft tissue abnormality is noted.  07-11-20: right hip x-ray:  Acute, impacted, angulated right basicervical femoral neck fracture.   07-23-20: upper endoscopy:  Normal hypopharynx. - Normal esophagus. - Z-line irregular, 34 cm from the incisors. - Non-bleeding gastric ulcers with no stigmata of bleeding. - Duodenal ulcer with stigmata of bleed/ pigmented material. Gold probe therapy was not successful and hemostasis achieved with injection of dilute epinephrine. - Non-bleeding duodenal ulcer with no stigmata of bleeding(second bulbar ulcer) - Normal second portion of the duodenum. - No specimens collected.  07-23-20: chest x-ray:No  active disease. Aortic Atherosclerosis    NO NEW EXAMS.     LABS REVIEWED PREVIOUS  12-12-19: wbc 7.6; hgb 11.3; hct 35.6; mcv 109.9 plt 212; glucose 602; bun 67; creat 8.62; k+ 5.6; na++ 135; ca 7.9 liver normal albumin 3.5  12-20-19: hgb a1c 8.0  01-09-20: wbc 6.9; hgb 12.1; hct 39.7; mcv 105.6 plt 299; glucose 225; bun 112; creat 14.53; k+ 6.4; na++ 132; ca 8.3 liver normal albumin 3.5  01-23-20: k+ 6.6  01-23-20: wbc 7.9; hgb 11.6; hct 36.9; mcv 103.7 plt 359; glucose 139; bun 109; creat 15.46; k+ 6.1; na++ 140; ca 8.7 liver normal albumin 4.1;  01-24-20: k+ 4.4  01-30-20: wbc 10.6; hgb 9.7; hct 32.0; mcv 103.6 plt 390; glucose 164; bun 44; creat 9.08; k+ 4.1; na++ 139; ca 9.0 liver normal albumin 3.3 02-06-20: wbc 7.8; hgb 9.9; hct 32.7 mcv 104.8 plt 344; glucose 211; bun 90; creat 10.97; k+ 6.0 na++ 137; ca 8.8 liver normal albumin 3.4  03-26-20: wbc 6.3; hgb 9.4; hct 30.4; mcv 104.1; plt 300; glucose 223; bun 101; creat 14.22; k+ 5.8; na++ 135; ca 8.1 GFR 2; liver normal albumin 3.4  05-21-20; wbc 5.1; hgb 10.4; hct 32.8; mcv 106.8 plt 303; glucose 186; bun 41; creat 1.54; k+ 5.0; na++ 132; ca 8.4 GFR 5; liver normal albumin 3.7 06-13-20: chol 189; ldl 106; trig 211; hdl 41; hgb a1c 6.2   07-11-20: wbc 9.1; hgb 10.6; hct 34.6; mcv 109.1 plt 248; glucose 79; bun 45; creat 7.71 k+ 4.9; na++ 134; ca 8.1 GFR 5; liver normal albumin 3.5 mag 2.4 07-13-20: wbc 11.4; hgb 8.9; hct 29.7; mcv 108.8 plt 303; glucose 178; bun 66; creat 11.17; k+ 6.6; na++ 135; ca 8.2; GFR 3 07-15-20: wbc 11.0; hgb 7.5; hct 25.2; mcv 108.2  plt 251; glucose 117; bun 60; creat 9.41; k+ 5.5; na++ 138; ca 8.4 GFR 4 07-17-20: wbc 11.5; hgb 9.4; hct 29.8; mcv 100.3 plt 300; glucose 140; bun 68; creat 8.04; k+ 4.2; na++ 135; ca 8.5 GFR 5 07-22-20: wbc 11.6; hgb 7.1; hct 23.5; mcv 104.0 plt 444; glucose 214; bun 97; creat 9.18; k+ 4.5; na++ 140; ca 8.0; GFR4 07-24-20: wbc 14.3; hgb 6.7; hct 21.2; mcv 99.5 plt 385; glucose 96; bun 49;  creat 6.55; k+ 4.7; na++ 133; ca 8.0; GFR4; mag 2.0 phos 3.8 07-26-20: wbc 10.4; hgb 8.5; hct 26.8; mcv 98.9 plt 430; glucose 111; bun 40;creat 6.90; k+ 4.2; na++ 133; ca 8.5; GFR 6; mag 2.2; phos 3.3    07-30-20: wbc 7.6; hgb 7.3; hct 24.1; mcv 103.4 plt 345; glucose 97; bun 26; creat 5.89; k+ 4.1; na++ 139; ca 8.6; GFR 7; liver normal albumin 2.8.  09-03-20: wbc 5.8; hgb 11.2; hct 37.3; mcv 102.8 plt 274; glucose 175; bun 38; creat 5.72; k+ 3.1; na++ 138; ca 9.2 GFR 7 alk phos 138; albumin 3.4 mag 2.0 09-30-20: wbc 5.6; hgb 13.1; hct 42.6; mcv 97.9 plt 260; glucose 92; bun 23; creat 4.18; k+ 3.1; na++ 134; ca 8.7; GFR 10; liver normal albumin 3.8 10-29-20: wbc 5.3; hgb 12.1; hct 40.0; mcv 96.6 plt 218; glucose 190; bun 54; creat 7.35 k+ 4.0; na++ 134; ca 9.4; GFR 5 liver normal albumin 3.7; mag 2.3 11-19-20: wbc 10.9; hgb 11.3; hct 37.2; mcv 96.6 plt 242; glucose 106; bun 47; creat 6.58; k+ 3.9; na++ 133; ca 9.0; GFR 6 liver normal albumin 3.5  12-02-20: hgb a1c 5.6; chol 167; ldl 83; trig 229; hdl 38 12-03-20; wbc 5.2; hgb 11.1; hct 35.7; mcv 96.5 plt 320; glucose 139; bun 47; creat 6.05; k+ 4.1; na++ 134; ca 9.1; GFR 6; liver normal albumin 3.8 mag 2.4   TODAY  12-17-20: wbc 5.2; hgb 10.5; hct 33.8; mcv 98.8 plt 256; glucose 136; bun 43; creat 6.19; k+ 3.6; na++ 137; ca 8.9; GFR 6 liver normal albumin 3.9    Review of Systems  Constitutional:  Negative for malaise/fatigue.  Respiratory:  Negative for cough and shortness of breath.   Cardiovascular:  Negative for chest pain, palpitations and leg swelling.  Gastrointestinal:  Negative for abdominal pain, constipation and heartburn.  Musculoskeletal:  Negative for back pain, joint pain and myalgias.  Skin: Negative.   Neurological:  Negative for dizziness.  Psychiatric/Behavioral:  The patient is not nervous/anxious.    Physical Exam Constitutional:      General: She is not in acute distress.    Appearance: She is well-developed. She is not  diaphoretic.  Neck:     Thyroid: No thyromegaly.  Cardiovascular:     Rate and Rhythm: Normal rate and regular rhythm.     Pulses: Normal pulses.     Heart sounds: Normal heart sounds.  Pulmonary:     Effort: Pulmonary effort is normal. No respiratory distress.     Breath sounds: Normal breath sounds.  Chest:  Breasts:    Right: Absent.  Abdominal:     General: Bowel sounds are normal. There is no distension.     Palpations: Abdomen is soft.     Tenderness: There is no abdominal tenderness.  Musculoskeletal:     Cervical back: Neck supple.     Right lower leg: No edema.     Left lower leg: No edema.     Comments: Right hip IM nail 07-12-20  Is able to move all extremities  Lymphadenopathy:     Cervical: No cervical adenopathy.  Skin:    General: Skin is warm and dry.     Comments: Chest wall dialysis access  Neurological:     Mental Status: She is alert. Mental status is at baseline.  Psychiatric:        Mood and Affect: Mood normal.     ASSESSMENT/ PLAN:  TODAY  Multiple gastric ulcers: duodenal ulcer: upper GI bleed: esophageal reflux disease without esophagitis: is stable will continue protonix 40 mg twice daily carafate 1 gm four times daily   2. Closed right hip fracture sequela: is stable is status right hip nail 07-12-20: will continue tylenol 605 mg three times daily   3. Chronic kidney disease with end stage renal disease on dialysis due to type 2 diabetes mellitus/dependence on dialysis: is stable is on hemodialysis three days weekly 1200 cc fluid restriction; will continue calcium acetate: 1334 mg with breakfast and supper 667 mg with lunch and 667 mg with snacks  4. Hypertension due to end stage renal disease (ESRD) due to type 2 diabetes mellitus is stable b/p 134/71 norvasc 10 mg daily tenormin 25 mg daily clonidine 0.1 mg daily    PREVIOUS   5. Hyperlipidemia associated with type 2 diabetes mellitus is stable LDL 83 will continue lipitor 10 mg daily    6. Diabetes mellitus with end stage renal disease (ESRD) is stable hgb a1c 5.8 will continue lantus 8 units nightly;   7. Aortic atherosclerosis/atherosclerotic peripheral vascular disease: is stable (ct 10-04-18)   8. Vascular dementia with behavioral disturbance: is without change weight is 136 pounds; will monitor   9. Anemia due to end stage renal disease: is stable hgb 10.5 will continue to monitor her status   10. Stage 1 infiltrating ductal carcinoma of female right breast/status post mastectomy on right. Will continue arimidex 1 mg daily   11. Multiple myeloma not having achieved remission: is followed by oncology  12. Herpes: is without outbreak: will continue acyclovir 200 mg daily   13. Major depression chronic recurrent is stable will continue zoloft 100 mg daily   14. Protein calorie malnutrition, severe: is stable albumin is 3.9 will continue supplement as directed and will continue prostat 30 mL three times daily      Ok Edwards NP [facility]  Contact [phone] Monday through Friday 8am- 5pm  After hours call [phone]

## 2021-01-01 DIAGNOSIS — C9000 Multiple myeloma not having achieved remission: Secondary | ICD-10-CM | POA: Diagnosis not present

## 2021-01-01 DIAGNOSIS — R279 Unspecified lack of coordination: Secondary | ICD-10-CM | POA: Diagnosis not present

## 2021-01-01 DIAGNOSIS — N186 End stage renal disease: Secondary | ICD-10-CM | POA: Diagnosis not present

## 2021-01-01 DIAGNOSIS — Z1159 Encounter for screening for other viral diseases: Secondary | ICD-10-CM | POA: Diagnosis not present

## 2021-01-01 DIAGNOSIS — M6281 Muscle weakness (generalized): Secondary | ICD-10-CM | POA: Diagnosis not present

## 2021-01-01 DIAGNOSIS — Z9181 History of falling: Secondary | ICD-10-CM | POA: Diagnosis not present

## 2021-01-01 DIAGNOSIS — I259 Chronic ischemic heart disease, unspecified: Secondary | ICD-10-CM | POA: Diagnosis not present

## 2021-01-01 DIAGNOSIS — Z992 Dependence on renal dialysis: Secondary | ICD-10-CM | POA: Diagnosis not present

## 2021-01-02 DIAGNOSIS — M6281 Muscle weakness (generalized): Secondary | ICD-10-CM | POA: Diagnosis not present

## 2021-01-02 DIAGNOSIS — N186 End stage renal disease: Secondary | ICD-10-CM | POA: Diagnosis not present

## 2021-01-02 DIAGNOSIS — R279 Unspecified lack of coordination: Secondary | ICD-10-CM | POA: Diagnosis not present

## 2021-01-02 DIAGNOSIS — Z9181 History of falling: Secondary | ICD-10-CM | POA: Diagnosis not present

## 2021-01-03 DIAGNOSIS — Z992 Dependence on renal dialysis: Secondary | ICD-10-CM | POA: Diagnosis not present

## 2021-01-03 DIAGNOSIS — N186 End stage renal disease: Secondary | ICD-10-CM | POA: Diagnosis not present

## 2021-01-06 DIAGNOSIS — N186 End stage renal disease: Secondary | ICD-10-CM | POA: Diagnosis not present

## 2021-01-06 DIAGNOSIS — Z992 Dependence on renal dialysis: Secondary | ICD-10-CM | POA: Diagnosis not present

## 2021-01-06 DIAGNOSIS — R279 Unspecified lack of coordination: Secondary | ICD-10-CM | POA: Diagnosis not present

## 2021-01-06 DIAGNOSIS — Z9181 History of falling: Secondary | ICD-10-CM | POA: Diagnosis not present

## 2021-01-06 DIAGNOSIS — M6281 Muscle weakness (generalized): Secondary | ICD-10-CM | POA: Diagnosis not present

## 2021-01-07 ENCOUNTER — Encounter: Payer: Self-pay | Admitting: Orthopedic Surgery

## 2021-01-07 ENCOUNTER — Ambulatory Visit (INDEPENDENT_AMBULATORY_CARE_PROVIDER_SITE_OTHER): Payer: Medicare PPO | Admitting: Orthopedic Surgery

## 2021-01-07 ENCOUNTER — Inpatient Hospital Stay: Payer: Medicare PPO

## 2021-01-07 VITALS — Ht 63.0 in | Wt 136.0 lb

## 2021-01-07 DIAGNOSIS — S72001D Fracture of unspecified part of neck of right femur, subsequent encounter for closed fracture with routine healing: Secondary | ICD-10-CM

## 2021-01-07 NOTE — Progress Notes (Signed)
Orthopaedic Postop Note  Assessment: Carly Wood is a 85 y.o. female s/p cephalomedullary nail for right intertrochanteric fracture   DOS: 07/12/2020  Plan: Radiographs stable.  No hardware failure or screw cutout.  She denies pain in her hip.  She has been doing exercises with assistance.  WBAT on RLE, ROM as tolerated.   Contact clinic with issues.  Follow up as needed.    Follow-up: Return if symptoms worsen or fail to improve. XR at next visit: AP pelvis, right hip x-ray  Subjective:  Chief Complaint  Patient presents with   Routine Post Op    Rt hip DOS 07/12/20    History of Present Illness: Carly Wood is a 85 y.o. female who presents following the above stated procedure.  Surgery was approximately 5 months ago.  She has no issues.  She denies pain in her groin.  Occasionally she has some pain over the lateral hip.  She remains in a facility, is on chronic dialysis.  She also receives regular injections for multiple myeloma.  Her health has remained stable.  She continues work with therapy in her facility.  She is relatively sedentary otherwise.   Review of Systems: No fevers or chills No numbness or tingling No Chest Pain No shortness of breath   Objective: Ht _0  (1.6 m)   Wt 136 lb (61.7 kg)   BMI 24.09 kg/m   Physical Exam:  Elderly female.  Seated in a wheelchair.    Lateral hip surgical incisions are healing well, without surrounding erythema or drainage.  She can maintain a straight leg raise.  She has no pain with axial loading.  She tolerates gentle range of motion without right groin pain.  Sensation is intact to the dorsum of the foot.  Active motion intact in the EHL/TA.  IMAGING: I personally ordered and reviewed the following images:  AP pelvis and right femur x-rays were obtained in clinic today.  These were compared to previous x-rays.  There has been no interval displacement of the fracture.  No further migration of the lag screw, within  the femoral head.  The fracture has collapsed into some varus, but this remains stable in comparison to previous x-rays.  Impression: Stable right intertrochanteric fracture, with varus alignment overall.  No hardware failure or complication.  Mordecai Rasmussen, MD 01/07/2021 8:45 AM

## 2021-01-08 DIAGNOSIS — R279 Unspecified lack of coordination: Secondary | ICD-10-CM | POA: Diagnosis not present

## 2021-01-08 DIAGNOSIS — Z9181 History of falling: Secondary | ICD-10-CM | POA: Diagnosis not present

## 2021-01-08 DIAGNOSIS — Z992 Dependence on renal dialysis: Secondary | ICD-10-CM | POA: Diagnosis not present

## 2021-01-08 DIAGNOSIS — M6281 Muscle weakness (generalized): Secondary | ICD-10-CM | POA: Diagnosis not present

## 2021-01-08 DIAGNOSIS — N186 End stage renal disease: Secondary | ICD-10-CM | POA: Diagnosis not present

## 2021-01-08 NOTE — Progress Notes (Signed)
[place], [place] [address]   CLINIC:  Medical Oncology/Hematology  PCP:  Gerlene Fee, NP [address] [place] [address] [phone]   REASON FOR VISIT:  Follow-up for plasma cell myeloma and anemia  PRIOR THERAPY: none  NGS Results: not done  CURRENT THERAPY: Velcade & Decadron 3/4 weeks  BRIEF ONCOLOGIC HISTORY:  Oncology History  Stage 1 infiltrating ductal carcinoma of right female breast ([place])  09/12/2014 Mammogram   Mass in upper outer R breast, middle third depth appears slightly larger than it was on prior exam with more irreg spiculated margins   10/02/2014 Pathology Results   biopsy with invasive ductal carcinoma high grade 1.1 cm, dcis solid type. additional R breast tissue, excision, invasive ductal high grade 0.9 cm    10/24/2014 Pathology Results   no residual invasive carcinoma, DCIS, focal, atypical ductal hyperplasia, 0/7 LN positive for metastatic carcinoma ER > 90%, PR 30%, HER 2 2+   10/24/2014 Cancer Staging   T1cN0M0   10/24/2014 Surgery   R mastectomy, T1c, N0   12/28/2014 - 11/22/2015 Chemotherapy   Taxol/herceptin weekly X 12, Herceptin every 21 days, last due in September 2017   03/11/2015 -  Anti-estrogen oral therapy   Arimidex 1 mg daily   09/12/2015 Imaging   MUGA- The left ventricular ejection fraction equals 68%.   06/08/2016 Treatment Plan Change   Started Nerlynx   Multiple myeloma ([place])  10/04/2018 Initial Diagnosis   Multiple myeloma ([place])   10/11/2018 -  Chemotherapy   Patient is on Treatment Plan : MYELOMA NON-TRANSPLANT CANDIDATES VRd weekly d21d        CANCER STAGING: Cancer Staging Stage 1 infiltrating ductal carcinoma of right female breast [facility]) Staging form: Breast, AJCC 7th Edition - Clinical stage from 08/30/2015: Stage IA (T1c, N0, M0) - Signed by Baird Cancer, PA-C on 08/30/2015   INTERVAL HISTORY:  Ms. Carly Wood, a 85 y.o. female, returns for routine  follow-up and consideration for next cycle of chemotherapy. Adelai was last seen on 11/19/2020.  Due for cycle #30 of VRd today.   Overall, she tells me she has been feeling pretty well. She reports numbness in her right fingers.   Overall, she feels ready for next cycle of chemo today.   REVIEW OF SYSTEMS:  Review of Systems  Constitutional:  Negative for appetite change and fatigue (75%).  Respiratory:  Positive for cough.   Neurological:  Positive for numbness (R fingers).  All other systems reviewed and are negative.  PAST MEDICAL/SURGICAL HISTORY:  Past Medical History:  Diagnosis Date   Anemia     chronic macrocytic anemia   Anxiety    Chronic kidney disease    Chronic renal disease, stage 4, severely decreased glomerular filtration rate (GFR) between 15-29 mL/min/1.73 square meter ([place]) 88/01/314   Complication of anesthesia    delirious after Breast Surgery   Dementia ([place])    mild   Depression    Diabetes mellitus with ESRD (end-stage renal disease) ([name])    type II   Dysphagia    Dyspnea    with activity   GERD (gastroesophageal reflux disease)    Glaucoma    Hyperlipidemia    Hypertension    Pneumonia    Stage 1 infiltrating ductal carcinoma of right female breast ([place]) 08/21/2015   ER+ PR+ HER 2 neu + (3+) T1cN0    Past Surgical History:  Procedure Laterality Date  A/V FISTULAGRAM Right 07/05/2020   Procedure: A/V Fistulagram;  Surgeon: Elam Dutch, MD;  Location: [place] [facility] LAB;  Service: Cardiovascular;  Laterality: Right;   AV FISTULA PLACEMENT Left 11/22/2017   Procedure: ARTERIOVENOUS (AV) FISTULA CREATION LEFT ARM;  Surgeon: Elam Dutch, MD;  [facility];  Service: Vascular;  Laterality: Left;   AV FISTULA PLACEMENT Right 04/04/2020   Procedure: RIGHT ARM ARTERIOVENOUS FISTULA CREATION;  Surgeon: Rosetta Posner, MD;  Location: [facility] ORS;  Service: Vascular;  Laterality: Right;   AV FISTULA PLACEMENT Right 08/20/2020   Procedure:  ARTERIOVENOUS (AV) FISTULA LIGATION RIGHT ARM;  Surgeon: Elam Dutch, MD;  Location: [facility] OR;  Service: Vascular;  Laterality: Right;   BIOPSY  08/07/2016   Procedure: BIOPSY;  Surgeon: Daneil Dolin, MD;  [facility];  Service: Endoscopy;;  gastric ulcer biopsy   COLONOSCOPY     ESOPHAGOGASTRODUODENOSCOPY N/A 08/07/2016   LA Grade A esophagitis s/p dilation, small hiatal hernia, multiple gastric ulcers and erosions, duodenal erosions s/p biopsy. Negative H.pylori    ESOPHAGOGASTRODUODENOSCOPY N/A 11/27/2016   normal esophagus, previously noted gastric ulcers completely healed, normal duodenum.    ESOPHAGOGASTRODUODENOSCOPY (EGD) WITH PROPOFOL N/A 07/23/2020   Procedure: ESOPHAGOGASTRODUODENOSCOPY (EGD) WITH PROPOFOL;  Surgeon: Rogene Houston, MD;  [facility];  Service: Endoscopy;  Laterality: N/A;   FISTULA SUPERFICIALIZATION Left 02/14/2018   Procedure: FISTULA SUPERFICIALIZATION LEFT ARM;  Surgeon: Angelia Mould, MD;  Location: [facility] OR;  Service: Vascular;  Laterality: Left;   FRACTURE SURGERY Right    ankle   HOT HEMOSTASIS  07/23/2020   Procedure: HOT HEMOSTASIS (ARGON PLASMA COAGULATION/BICAP);  Surgeon: Rogene Houston, MD;  [facility];  Service: Endoscopy;;   INTRAMEDULLARY (IM) NAIL INTERTROCHANTERIC Right 07/12/2020   Procedure: INTRAMEDULLARY (IM) NAIL INTERTROCHANTRIC;  Surgeon: Mordecai Rasmussen, MD;  Location: [facility];  Service: Orthopedics;  Laterality: Right;   MALONEY DILATION N/A 08/07/2016   Procedure: Venia Minks DILATION;  Surgeon: Daneil Dolin, MD;  [facility];  Service: Endoscopy;  Laterality: N/A;   MASTECTOMY, PARTIAL Right    multiple myeloma     PERIPHERAL VASCULAR BALLOON ANGIOPLASTY Left 07/13/2019   Procedure: PERIPHERAL VASCULAR BALLOON ANGIOPLASTY;  Surgeon: Marty Heck, MD;  Location: [place] [facility] LAB;  Service: Cardiovascular;  Laterality: Left;  arm fistulogram   PERIPHERAL VASCULAR BALLOON  ANGIOPLASTY Right 05/22/2020   Procedure: PERIPHERAL VASCULAR BALLOON ANGIOPLASTY;  Surgeon: Cherre Robins, MD;  Location: [place] [facility];  Service: Cardiovascular;  Laterality: Right;  arm fistula   PERIPHERAL VASCULAR BALLOON ANGIOPLASTY Right 07/05/2020   Procedure: PERIPHERAL VASCULAR BALLOON ANGIOPLASTY;  Surgeon: Elam Dutch, MD;  Location: [place] [facility];  Service: Cardiovascular;  Laterality: Right;  arm fistula   PORT-A-CATH REMOVAL Left 11/22/2017   Procedure: REMOVAL PORT-A-CATH LEFT CHEST;  Surgeon: Elam Dutch, MD;  Location: [facility];  Service: Vascular;  Laterality: Left;   RETINAL DETACHMENT SURGERY Right    SCLEROTHERAPY  07/23/2020   Procedure: SCLEROTHERAPY;  Surgeon: Rogene Houston, MD;  [facility];  Service: Endoscopy;;   UPPER EXTREMITY VENOGRAPHY N/A 08/22/2020   Procedure: LEFT UPPER & CENTRAL VENOGRAPHY;  Surgeon: Marty Heck, MD;  Location: [place] [facility] LAB;  Service: Cardiovascular;  Laterality: N/A;    SOCIAL HISTORY:  Social History   Socioeconomic History   Marital status: Single    Spouse name: Not on file   Number of  children: Not on file   Years of education: Not on file   Highest education level: Not on file  Occupational History   Occupation: retired   Tobacco Use   Smoking status: Never   Smokeless tobacco: Never  Vaping Use   Vaping Use: Never used  Substance and Sexual Activity   Alcohol use: No    Alcohol/week: 0.0 standard drinks   Drug use: No   Sexual activity: Never  Other Topics Concern   Not on file  Social History Narrative   Long term resident of SNF    Social Determinants of Health   Financial Resource Strain: Low Risk    Difficulty of Paying Living Expenses: Not very hard  Food Insecurity: No Food Insecurity   Worried About Charity fundraiser in the Last Year: Never true   [place] in the Last Year: Never true  Transportation Needs: No Transportation Needs   Lack of  Transportation (Medical): No   Lack of Transportation (Non-Medical): No  Physical Activity: Inactive   Days of Exercise per Week: 0 days   Minutes of Exercise per Session: 0 min  Stress: No Stress Concern Present   Feeling of Stress : Not at all  Social Connections: Moderately Isolated   Frequency of Communication with Friends and Family: More than three times a week   Frequency of Social Gatherings with Friends and Family: Once a week   Attends Religious Services: More than 4 times per year   Active Member of [employer] or Organizations: No   Attends Music therapist: Never   Marital Status: Never married  Human resources officer Violence: Not At Risk   Fear of Current or Ex-Partner: No   Emotionally Abused: No   Physically Abused: No   Sexually Abused: No    FAMILY HISTORY:  Family History  Problem Relation Age of Onset   Multiple myeloma Sister    Brain cancer Sister    Dementia Mother        died at 30   Stroke Mother    Heart failure Mother    Diabetes Mother    Heart disease Father    Prostate cancer Brother    Colon cancer Neg Hx     CURRENT MEDICATIONS:  Current Outpatient Medications  Medication Sig Dispense Refill   anastrozole (ARIMIDEX) 1 MG tablet TAKE 1 TABLET BY MOUTH DAILY 30 tablet 6   No current facility-administered medications for this visit.    ALLERGIES:  Allergies  Allergen Reactions   Ace Inhibitors Other (See Comments) and Cough    Tongue swell , ie angioedema   Angiotensin Receptor Blockers     Angioedema with ACE-I   Penicillins Other (See Comments)    Unsure of reaction Has patient had a PCN reaction causing immediate rash, facial/tongue/throat swelling, SOB or lightheadedness with hypotension: Unknown Has patient had a PCN reaction causing severe rash involving mucus membranes or skin necrosis: Unknown Has patient had a PCN reaction that required hospitalization: No Has patient had a PCN reaction occurring within the last 10 years:  Unknown If all of the above answers are "NO", then may proceed with Cephalosporin use.     Penicillin G     PHYSICAL EXAM:  Performance status (ECOG): 2 - Symptomatic, <50% confined to bed  There were no vitals filed for this visit. Wt Readings from Last 3 Encounters:  01/07/21 136 lb (61.7 kg)  12/31/20 136 lb 9.6 oz (62 kg)  12/26/20 136 lb  9.6 oz (62 kg)   Physical Exam Vitals reviewed.  Constitutional:      Appearance: Normal appearance.     Comments: In wheelchair  Cardiovascular:     Rate and Rhythm: Normal rate and regular rhythm.     Pulses: Normal pulses.     Heart sounds: Normal heart sounds.  Pulmonary:     Effort: Pulmonary effort is normal.     Breath sounds: Normal breath sounds.  Musculoskeletal:     Right lower leg: No edema.     Left lower leg: No edema.  Neurological:     General: No focal deficit present.     Mental Status: She is alert and oriented to person, place, and time.  Psychiatric:        Mood and Affect: Mood normal.        Behavior: Behavior normal.    LABORATORY DATA:  I have reviewed the labs as listed.  CBC Latest Ref Rng & Units 12/17/2020 12/03/2020 11/19/2020  WBC 4.0 - 10.5 K/uL 5.2 5.2 10.9(H)  Hemoglobin 12.0 - 15.0 g/dL 10.5(L) 11.1(L) 11.3(L)  Hematocrit 36.0 - 46.0 % 33.8(L) 35.7(L) 37.2  Platelets 150 - 400 K/uL 256 320 242   CMP Latest Ref Rng & Units 12/17/2020 12/03/2020 11/19/2020  Glucose 70 - 99 mg/dL 136(H) 139(H) 106(H)  BUN 8 - 23 mg/dL 43(H) 47(H) 47(H)  Creatinine 0.44 - 1.00 mg/dL 6.19(H) 6.05(H) 6.58(H)  Sodium 135 - 145 mmol/L 137 134(L) 133(L)  Potassium 3.5 - 5.1 mmol/L 3.6 4.1 3.9  Chloride 98 - 111 mmol/L 98 97(L) 94(L)  CO2 22 - 32 mmol/L _0 Calcium 8.9 - 10.3 mg/dL 8.9 9.1 9.0  Total Protein 6.5 - 8.1 g/dL 6.8 6.9 6.5  Total Bilirubin 0.3 - 1.2 mg/dL 0.5 0.3 0.6  Alkaline Phos 38 - 126 U/L 101 114 108  AST 15 - 41 U/L _1 ALT 0 - 44 U/L _2 DIAGNOSTIC IMAGING:  I have  independently reviewed the scans and discussed with the patient. No results found.   ASSESSMENT:  1.  IgA lambda plasma cell myeloma, stage II, standard risk: -BMBX on 09/20/2018 shows plasma cell myeloma, 30% plasma cells.  FISH panel was normal.  Chromosome analysis normal. -Labs at diagnosis on 08/29/2018 with M spike 0.9 g.  Kappa light chains 148, lambda light chains 1132, ratio 0.13.  LDH normal.  Beta-2 microglobulin 13.5.  She had transfusion dependent anemia. -Velcade and dexamethasone started on 10/11/2018. -Myeloma panel on 07/11/2019 shows SPEP is negative.  Free light chain ratio improved to 1.09.  Lambda light chains are 128, improved from 141.  Kappa light chains are 140. -She has been resident at [facility] since 10/12/2019.   2.  Stage I IDC of the right breast, ER/PR positive, HER-2 negative: -She is on anastrozole.   PLAN:  1.  IgA lambda plasma cell myeloma: - Reviewed myeloma panel from 12/17/2020 which showed M spike was negative.  Free light chain ratio is 0.8 and lambda light chains 227 and stable.  Immunofixation positive for IgG lambda. - She does not report any new numbness.  She has numbness in the right hand fingers which is stable.  No infections. - Continue maintenance Velcade every 2 weeks with dexamethasone 10 mg on days of Velcade. - RTC 8 weeks for follow-up with myeloma labs 2 weeks prior.   2.  ESRD on HD: - Continue HD on Monday, Wednesday and Friday.  3.  Bone strengthening: - Bisphosphonates were not started due to patient being on dialysis.   4.  Osteopenia: - Last bone density in 2017 showed osteopenia. - We will consider repeating it in the near future.   5.  Right breast cancer: - She will continue anastrozole.  Tolerating well.   Orders placed this encounter:  No orders of the defined types were placed in this encounter.    Derek Jack, MD [place] [phone]   I, Thana Ates, am acting as a scribe for Dr.  Derek Jack.  I, Derek Jack MD, have reviewed the above documentation for accuracy and completeness, and I agree with the above.

## 2021-01-09 ENCOUNTER — Ambulatory Visit (HOSPITAL_COMMUNITY)
Admission: RE | Admit: 2021-01-09 | Discharge: 2021-01-09 | Disposition: A | Discharge status: Home / Self Care | Payer: Medicare PPO | Source: Ambulatory Visit | Attending: Hematology | Admitting: Hematology

## 2021-01-09 ENCOUNTER — Inpatient Hospital Stay (HOSPITAL_BASED_OUTPATIENT_CLINIC_OR_DEPARTMENT_OTHER): Payer: Medicare PPO | Admitting: Hematology

## 2021-01-09 ENCOUNTER — Inpatient Hospital Stay (HOSPITAL_COMMUNITY): Payer: Medicare PPO | Attending: Hematology

## 2021-01-09 ENCOUNTER — Inpatient Hospital Stay (HOSPITAL_COMMUNITY): Payer: Medicare PPO

## 2021-01-09 VITALS — BP 116/72 | HR 60 | Temp 97.6°F | Resp 18

## 2021-01-09 DIAGNOSIS — C50919 Malignant neoplasm of unspecified site of unspecified female breast: Secondary | ICD-10-CM | POA: Diagnosis not present

## 2021-01-09 DIAGNOSIS — N186 End stage renal disease: Secondary | ICD-10-CM | POA: Diagnosis not present

## 2021-01-09 DIAGNOSIS — I120 Hypertensive chronic kidney disease with stage 5 chronic kidney disease or end stage renal disease: Secondary | ICD-10-CM | POA: Diagnosis not present

## 2021-01-09 DIAGNOSIS — K219 Gastro-esophageal reflux disease without esophagitis: Secondary | ICD-10-CM | POA: Insufficient documentation

## 2021-01-09 DIAGNOSIS — E785 Hyperlipidemia, unspecified: Secondary | ICD-10-CM | POA: Diagnosis not present

## 2021-01-09 DIAGNOSIS — N184 Chronic kidney disease, stage 4 (severe): Secondary | ICD-10-CM | POA: Diagnosis not present

## 2021-01-09 DIAGNOSIS — Z13820 Encounter for screening for osteoporosis: Secondary | ICD-10-CM | POA: Insufficient documentation

## 2021-01-09 DIAGNOSIS — Z170 Estrogen receptor positive status [ER+]: Secondary | ICD-10-CM | POA: Insufficient documentation

## 2021-01-09 DIAGNOSIS — Z5111 Encounter for antineoplastic chemotherapy: Secondary | ICD-10-CM | POA: Insufficient documentation

## 2021-01-09 DIAGNOSIS — M8588 Other specified disorders of bone density and structure, other site: Secondary | ICD-10-CM | POA: Diagnosis not present

## 2021-01-09 DIAGNOSIS — C9000 Multiple myeloma not having achieved remission: Secondary | ICD-10-CM | POA: Insufficient documentation

## 2021-01-09 DIAGNOSIS — M8580 Other specified disorders of bone density and structure, unspecified site: Secondary | ICD-10-CM | POA: Diagnosis not present

## 2021-01-09 DIAGNOSIS — Z9011 Acquired absence of right breast and nipple: Secondary | ICD-10-CM | POA: Diagnosis not present

## 2021-01-09 DIAGNOSIS — E1122 Type 2 diabetes mellitus with diabetic chronic kidney disease: Secondary | ICD-10-CM | POA: Insufficient documentation

## 2021-01-09 DIAGNOSIS — F0390 Unspecified dementia without behavioral disturbance: Secondary | ICD-10-CM | POA: Diagnosis not present

## 2021-01-09 DIAGNOSIS — M810 Age-related osteoporosis without current pathological fracture: Secondary | ICD-10-CM | POA: Diagnosis not present

## 2021-01-09 DIAGNOSIS — Z79811 Long term (current) use of aromatase inhibitors: Secondary | ICD-10-CM | POA: Diagnosis not present

## 2021-01-09 LAB — CBC WITH DIFFERENTIAL/PLATELET
Abs Immature Granulocytes: 0.02 10*3/uL (ref 0.00–0.07)
Basophils Absolute: 0 10*3/uL (ref 0.0–0.1)
Basophils Relative: 1 %
Eosinophils Absolute: 0.1 10*3/uL (ref 0.0–0.5)
Eosinophils Relative: 2 %
HCT: 36.9 % (ref 36.0–46.0)
Hemoglobin: 11.7 g/dL — ABNORMAL LOW (ref 12.0–15.0)
Immature Granulocytes: 0 %
Lymphocytes Relative: 21 %
Lymphs Abs: 1 10*3/uL (ref 0.7–4.0)
MCH: 31.6 pg (ref 26.0–34.0)
MCHC: 31.7 g/dL (ref 30.0–36.0)
MCV: 99.7 fL (ref 80.0–100.0)
Monocytes Absolute: 0.6 10*3/uL (ref 0.1–1.0)
Monocytes Relative: 12 %
Neutro Abs: 3 10*3/uL (ref 1.7–7.7)
Neutrophils Relative %: 64 %
Platelets: 244 10*3/uL (ref 150–400)
RBC: 3.7 MIL/uL — ABNORMAL LOW (ref 3.87–5.11)
RDW: 18.6 % — ABNORMAL HIGH (ref 11.5–15.5)
WBC: 4.8 10*3/uL (ref 4.0–10.5)
nRBC: 0 % (ref 0.0–0.2)

## 2021-01-09 LAB — COMPREHENSIVE METABOLIC PANEL
ALT: 16 U/L (ref 0–44)
AST: 21 U/L (ref 15–41)
Albumin: 4.1 g/dL (ref 3.5–5.0)
Alkaline Phosphatase: 113 U/L (ref 38–126)
Anion gap: 16 — ABNORMAL HIGH (ref 5–15)
BUN: 44 mg/dL — ABNORMAL HIGH (ref 8–23)
CO2: 24 mmol/L (ref 22–32)
Calcium: 9.5 mg/dL (ref 8.9–10.3)
Chloride: 96 mmol/L — ABNORMAL LOW (ref 98–111)
Creatinine, Ser: 5.69 mg/dL — ABNORMAL HIGH (ref 0.44–1.00)
GFR, Estimated: 7 mL/min — ABNORMAL LOW (ref >60)
Glucose, Bld: 155 mg/dL — ABNORMAL HIGH (ref 70–99)
Potassium: 3.5 mmol/L (ref 3.5–5.1)
Sodium: 136 mmol/L (ref 135–145)
Total Bilirubin: 0.3 mg/dL (ref 0.3–1.2)
Total Protein: 7.5 g/dL (ref 6.5–8.1)

## 2021-01-09 LAB — MAGNESIUM: Magnesium: 2 mg/dL (ref 1.7–2.4)

## 2021-01-09 MED ORDER — PROCHLORPERAZINE MALEATE 10 MG PO TABS
10.0000 mg | ORAL_TABLET | Freq: Once | ORAL | Status: AC
  Administered 2021-01-09: 10 mg via ORAL
  Filled 2021-01-09: qty 1

## 2021-01-09 MED ORDER — DEXAMETHASONE 4 MG PO TABS
10.0000 mg | ORAL_TABLET | Freq: Once | ORAL | Status: AC
  Administered 2021-01-09: 10 mg via ORAL
  Filled 2021-01-09: qty 3

## 2021-01-09 MED ORDER — BORTEZOMIB CHEMO SQ INJECTION 3.5 MG (2.5MG/ML)
1.3000 mg/m2 | Freq: Once | INTRAMUSCULAR | Status: AC
  Administered 2021-01-09: 2.25 mg via SUBCUTANEOUS
  Filled 2021-01-09: qty 0.9

## 2021-01-09 NOTE — Patient Instructions (Signed)
[facility]  Discharge Instructions: Thank you for choosing [facility] to provide your oncology and hematology care.  If you have a lab appointment with the [facility], please come in thru the Main Entrance and check in at the main information desk.  Wear comfortable clothing and clothing appropriate for easy access to any Portacath or PICC line.   We strive to give you quality time with your provider. You may need to reschedule your appointment if you arrive late (15 or more minutes).  Arriving late affects you and other patients whose appointments are after yours.  Also, if you miss three or more appointments without notifying the office, you may be dismissed from the clinic at the provider's discretion.      For prescription refill requests, have your pharmacy contact our office and allow 72 hours for refills to be completed.    Today you received the following chemotherapy and/or immunotherapy agents Velcade, return as scheduled.   To help prevent nausea and vomiting after your treatment, we encourage you to take your nausea medication as directed.  BELOW ARE SYMPTOMS THAT SHOULD BE REPORTED IMMEDIATELY: *FEVER GREATER THAN 100.4 F (38 C) OR HIGHER *CHILLS OR SWEATING *NAUSEA AND VOMITING THAT IS NOT CONTROLLED WITH YOUR NAUSEA MEDICATION *UNUSUAL SHORTNESS OF BREATH *UNUSUAL BRUISING OR BLEEDING *URINARY PROBLEMS (pain or burning when urinating, or frequent urination) *BOWEL PROBLEMS (unusual diarrhea, constipation, pain near the anus) TENDERNESS IN MOUTH AND THROAT WITH OR WITHOUT PRESENCE OF ULCERS (sore throat, sores in mouth, or a toothache) UNUSUAL RASH, SWELLING OR PAIN  UNUSUAL VAGINAL DISCHARGE OR ITCHING   Items with * indicate a potential emergency and should be followed up as soon as possible or go to the Emergency Department if any problems should occur.  Please show the CHEMOTHERAPY ALERT CARD or IMMUNOTHERAPY ALERT CARD at check-in to the  Emergency Department and triage nurse.  Should you have questions after your visit or need to cancel or reschedule your appointment, please contact [facility] [phone]  and follow the prompts.  Office hours are 8:00 a.m. to 4:30 p.m. Monday - Friday. Please note that voicemails left after 4:00 p.m. may not be returned until the following business day.  We are closed weekends and major holidays. You have access to a nurse at all times for urgent questions. Please call the main number to the clinic [phone] and follow the prompts.  For any non-urgent questions, you may also contact your provider using MyChart. We now offer e-Visits for anyone 29 and older to request care online for non-urgent symptoms. For details visit [email or web address].   Also download the MyChart app! Go to the app store, search "MyChart", open the app, select [facility], and log in with your MyChart username and password.  Due to Covid, a mask is required upon entering the hospital/clinic. If you do not have a mask, one will be given to you upon arrival. For doctor visits, patients may have 1 support person aged 80 or older with them. For treatment visits, patients cannot have anyone with them due to current Covid guidelines and our immunocompromised population.

## 2021-01-09 NOTE — Progress Notes (Signed)
Patient has been examined, vital signs and labs have been reviewed by Dr. Katragadda. ANC, Creatinine, LFTs, hemoglobin, and platelets are within treatment parameters per Dr. Katragadda. Patient may proceed with treatment per M.D.   

## 2021-01-09 NOTE — Progress Notes (Signed)
Patient tolerated Velcade injection with no complaints voiced. Lab work reviewed. See MAR for details. Injection site clean and dry with no bruising or swelling noted. Patient stable during and after injection. Band aid applied. VSS. Patient left in satisfactory condition via wheelchairto [facility] with no s/s of distress noted.  

## 2021-01-10 ENCOUNTER — Encounter: Payer: Self-pay | Admitting: Adult Health

## 2021-01-10 ENCOUNTER — Encounter (HOSPITAL_COMMUNITY): Payer: Self-pay | Admitting: Hematology

## 2021-01-10 ENCOUNTER — Non-Acute Institutional Stay (SKILLED_NURSING_FACILITY): Payer: Medicare PPO | Admitting: Adult Health

## 2021-01-10 DIAGNOSIS — N186 End stage renal disease: Secondary | ICD-10-CM

## 2021-01-10 DIAGNOSIS — E1122 Type 2 diabetes mellitus with diabetic chronic kidney disease: Secondary | ICD-10-CM

## 2021-01-10 DIAGNOSIS — M6281 Muscle weakness (generalized): Secondary | ICD-10-CM | POA: Diagnosis not present

## 2021-01-10 DIAGNOSIS — I700 Atherosclerosis of aorta: Secondary | ICD-10-CM | POA: Diagnosis not present

## 2021-01-10 DIAGNOSIS — Z992 Dependence on renal dialysis: Secondary | ICD-10-CM | POA: Diagnosis not present

## 2021-01-10 DIAGNOSIS — R279 Unspecified lack of coordination: Secondary | ICD-10-CM | POA: Diagnosis not present

## 2021-01-10 DIAGNOSIS — I70209 Unspecified atherosclerosis of native arteries of extremities, unspecified extremity: Secondary | ICD-10-CM

## 2021-01-10 DIAGNOSIS — F01518 Vascular dementia, unspecified severity, with other behavioral disturbance: Secondary | ICD-10-CM | POA: Diagnosis not present

## 2021-01-10 DIAGNOSIS — Z9181 History of falling: Secondary | ICD-10-CM | POA: Diagnosis not present

## 2021-01-10 LAB — KAPPA/LAMBDA LIGHT CHAINS
Kappa free light chain: 180.9 mg/L — ABNORMAL HIGH (ref 3.3–19.4)
Kappa, lambda light chain ratio: 0.59 (ref 0.26–1.65)
Lambda free light chains: 304.5 mg/L — ABNORMAL HIGH (ref 5.7–26.3)

## 2021-01-10 NOTE — Progress Notes (Signed)
Location:  [facility] Room Number: 148 Place of Service:  SNF (31)   CODE STATUS: dnr   Allergies  Allergen Reactions   Ace Inhibitors Other (See Comments) and Cough    Tongue swell , ie angioedema   Angiotensin Receptor Blockers     Angioedema with ACE-I   Penicillins Other (See Comments)    Unsure of reaction Has patient had a PCN reaction causing immediate rash, facial/tongue/throat swelling, SOB or lightheadedness with hypotension: Unknown Has patient had a PCN reaction causing severe rash involving mucus membranes or skin necrosis: Unknown Has patient had a PCN reaction that required hospitalization: No Has patient had a PCN reaction occurring within the last 10 years: Unknown If all of the above answers are "NO", then may proceed with Cephalosporin use.     Penicillin G     Chief Complaint  Patient presents with   Acute Visit    Care plan meeting     HPI:  We have come together for her care plan meeting family present  BIMS 11/15 mood 3/30. She has had one fall without injury present. She is nonambulatory. She requires limited to extensive assist with her adls. She is occasionally incontinent of bladder and frequently incontinent of bowel. She remains on hemodialysis three days per week. Dietary: feeds herself weight is stable at 139 pounds; has a good appetite on supplements.   Therapy recent discharged from therapy.  She continues to be followed for her chronic illnesses including:  Aortic atherosclerosis Atherosclerotic peripheral vascular disease  Diabetes mellitus with ESRD (end stage renal disease) Vascular dementia with behavioral disturbance  Past Medical History:  Diagnosis Date   Anemia     chronic macrocytic anemia   Anxiety    Chronic kidney disease    Chronic renal disease, stage 4, severely decreased glomerular filtration rate (GFR) between 15-29 mL/min/1.73 square meter ([place]) 95/11/3265   Complication of anesthesia    delirious  after Breast Surgery   Dementia ([place])    mild   Depression    Diabetes mellitus with ESRD (end-stage renal disease) ([facility])    type II   Dysphagia    Dyspnea    with activity   GERD (gastroesophageal reflux disease)    Glaucoma    Hyperlipidemia    Hypertension    Pneumonia    Stage 1 infiltrating ductal carcinoma of right female breast ([place]) 08/21/2015   ER+ PR+ HER 2 neu + (3+) T1cN0     Past Surgical History:  Procedure Laterality Date   A/V FISTULAGRAM Right 07/05/2020   Procedure: A/V Fistulagram;  Surgeon: Elam Dutch, MD;  Location: [facility];  Service: Cardiovascular;  Laterality: Right;   AV FISTULA PLACEMENT Left 11/22/2017   Procedure: ARTERIOVENOUS (AV) FISTULA CREATION LEFT ARM;  Surgeon: Elam Dutch, MD;  Location: [facility] OR;  Service: Vascular;  Laterality: Left;   AV FISTULA PLACEMENT Right 04/04/2020   Procedure: RIGHT ARM ARTERIOVENOUS FISTULA CREATION;  Surgeon: Rosetta Posner, MD;  Location: [facility];  Service: Vascular;  Laterality: Right;   AV FISTULA PLACEMENT Right 08/20/2020   Procedure: ARTERIOVENOUS (AV) FISTULA LIGATION RIGHT ARM;  Surgeon: Elam Dutch, MD;  Location: [facility];  Service: Vascular;  Laterality: Right;   BIOPSY  08/07/2016   Procedure: BIOPSY;  Surgeon: Daneil Dolin, MD;  Location[facility];  Service: Endoscopy;;  gastric ulcer biopsy   COLONOSCOPY     ESOPHAGOGASTRODUODENOSCOPY N/A 08/07/2016   LA  Grade A esophagitis s/p dilation, small hiatal hernia, multiple gastric ulcers and erosions, duodenal erosions s/p biopsy. Negative H.pylori    ESOPHAGOGASTRODUODENOSCOPY N/A 11/27/2016   normal esophagus, previously noted gastric ulcers completely healed, normal duodenum.    ESOPHAGOGASTRODUODENOSCOPY (EGD) WITH PROPOFOL N/A 07/23/2020   Procedure: ESOPHAGOGASTRODUODENOSCOPY (EGD) WITH PROPOFOL;  Surgeon: Rogene Houston, MD;  [facility];  Service: Endoscopy;  Laterality: N/A;   FISTULA SUPERFICIALIZATION Left  02/14/2018   Procedure: FISTULA SUPERFICIALIZATION LEFT ARM;  Surgeon: Angelia Mould, MD;  Location: [facility] OR;  Service: Vascular;  Laterality: Left;   FRACTURE SURGERY Right    ankle   HOT HEMOSTASIS  07/23/2020   Procedure: HOT HEMOSTASIS (ARGON PLASMA COAGULATION/BICAP);  Surgeon: Rogene Houston, MD;  [facility];  Service: Endoscopy;;   INTRAMEDULLARY (IM) NAIL INTERTROCHANTERIC Right 07/12/2020   Procedure: INTRAMEDULLARY (IM) NAIL INTERTROCHANTRIC;  Surgeon: Mordecai Rasmussen, MD;  Location: [facility];  Service: Orthopedics;  Laterality: Right;   MALONEY DILATION N/A 08/07/2016   Procedure: Venia Minks DILATION;  Surgeon: Daneil Dolin, MD;  [facility];  Service: Endoscopy;  Laterality: N/A;   MASTECTOMY, PARTIAL Right    multiple myeloma     PERIPHERAL VASCULAR BALLOON ANGIOPLASTY Left 07/13/2019   Procedure: PERIPHERAL VASCULAR BALLOON ANGIOPLASTY;  Surgeon: Marty Heck, MD;  Location: [facility];  Service: Cardiovascular;  Laterality: Left;  arm fistulogram   PERIPHERAL VASCULAR BALLOON ANGIOPLASTY Right 05/22/2020   Procedure: PERIPHERAL VASCULAR BALLOON ANGIOPLASTY;  Surgeon: Cherre Robins, MD;  Location: [facility];  Service: Cardiovascular;  Laterality: Right;  arm fistula   PERIPHERAL VASCULAR BALLOON ANGIOPLASTY Right 07/05/2020   Procedure: PERIPHERAL VASCULAR BALLOON ANGIOPLASTY;  Surgeon: Elam Dutch, MD;  Location: [facility];  Service: Cardiovascular;  Laterality: Right;  arm fistula   PORT-A-CATH REMOVAL Left 11/22/2017   Procedure: REMOVAL PORT-A-CATH LEFT CHEST;  Surgeon: Elam Dutch, MD;  Location: [facility];  Service: Vascular;  Laterality: Left;   RETINAL DETACHMENT SURGERY Right    SCLEROTHERAPY  07/23/2020   Procedure: SCLEROTHERAPY;  Surgeon: Rogene Houston, MD;  [facility];  Service: Endoscopy;;   UPPER EXTREMITY VENOGRAPHY N/A 08/22/2020   Procedure: LEFT UPPER & CENTRAL VENOGRAPHY;  Surgeon:  Marty Heck, MD;  Location: [facility];  Service: Cardiovascular;  Laterality: N/A;    Social History   Socioeconomic History   Marital status: Single    Spouse name: Not on file   Number of children: Not on file   Years of education: Not on file   Highest education level: Not on file  Occupational History   Occupation: retired   Tobacco Use   Smoking status: Never   Smokeless tobacco: Never  Vaping Use   Vaping Use: Never used  Substance and Sexual Activity   Alcohol use: No    Alcohol/week: 0.0 standard drinks   Drug use: No   Sexual activity: Never  Other Topics Concern   Not on file  Social History Narrative   Long term resident of SNF    Social Determinants of Health   Financial Resource Strain: Not on file  Food Insecurity: Not on file  Transportation Needs: Not on file  Physical Activity: Not on file  Stress: Not on file  Social Connections: Not on file  Intimate Partner Violence: Not on file   Family History  Problem Relation Age of Onset   Multiple myeloma Sister  Brain cancer Sister    Dementia Mother        died at 94   Stroke Mother    Heart failure Mother    Diabetes Mother    Heart disease Father    Prostate cancer Brother    Colon cancer Neg Hx       VITAL SIGNS BP 129/70   Pulse 69   Temp (!) 97.3 F (36.3 C)   Resp 18   Ht _0  (1.6 m)   Wt 139 lb 6.4 oz (63.2 kg)   SpO2 98%   BMI 24.69 kg/m   Outpatient Encounter Medications as of 01/10/2021  Medication Sig   acetaminophen (TYLENOL) 650 MG CR tablet Take 650 mg by mouth every 8 (eight) hours.   acyclovir (ZOVIRAX) 200 MG capsule Take 200 mg by mouth in the morning. (0800)   Amino Acids-Protein Hydrolys (FEEDING SUPPLEMENT, PRO-STAT SUGAR FREE 64,) LIQD Take 30 mLs by mouth 3 (three) times daily with meals. (0800, 1200 & 1800)   anastrozole (ARIMIDEX) 1 MG tablet TAKE 1 TABLET BY MOUTH DAILY   atenolol (TENORMIN) 25 MG tablet Take 1 tablet (25 mg total) by  mouth in the morning. (0800)   atorvastatin (LIPITOR) 10 MG tablet Take 10 mg by mouth at bedtime. (2000)   Balsam [place]-Castor Oil (VENELEX) OINT Apply topically. Apply to bilateral buttocks and sacrum every shift.   Calcium Acetate 667 MG TABS Take 667-1,334 mg by mouth See admin instructions. Take 2 tablets (1334 mg) by mouth with each meal (0800 &1730) and take 1 tablet (667 mg) by mouth with each snack (2000)   calcium-vitamin D (OSCAL WITH D) 500-200 MG-UNIT tablet Take 1 tablet by mouth in the morning. (0800)   cloNIDine (CATAPRES) 0.1 MG tablet Take 1 tablet (0.1 mg total) by mouth at bedtime. (2000) Hold for SBP < 110 bpm   insulin glargine (LANTUS SOLOSTAR) 100 UNIT/ML Solostar Pen Inject 10 Units into the skin at bedtime. (2000)   melatonin 3 MG TABS tablet Take 6 mg by mouth at bedtime. (2000) For Sleep   multivitamin (RENA-VIT) TABS tablet Take 1 tablet by mouth at bedtime. (2100)   NON FORMULARY Diet-Regular Diet with thin liquids   ondansetron (ZOFRAN) 4 MG tablet Take 1 tablet (4 mg total) by mouth every 6 (six) hours as needed for nausea. (Patient not taking: Reported on 01/09/2021)   pantoprazole (PROTONIX) 40 MG tablet Take 40 mg by mouth 2 (two) times daily.   sucralfate (CARAFATE) 1 GM/10ML suspension Take 1 g by mouth in the morning, at noon, in the evening, and at bedtime. (0800, 1200, 1700 & 2100)   No facility-administered encounter medications on file as of 01/10/2021.     SIGNIFICANT DIAGNOSTIC EXAMS   PREVIOUS   07-11-20: chest x-ray:  Central catheter tip at cavoatrial junction. No pneumothorax. Mild left base atelectasis. No edema or airspace opacity. Stable cardiac silhouette. Aortic Atherosclerosis   07-11-20: right knee x-ray:  No acute fracture or dislocation is noted. Diffuse vascular calcifications are seen. No soft tissue abnormality is noted.  07-11-20: right hip x-ray:  Acute, impacted, angulated right basicervical femoral neck fracture.   07-23-20: upper  endoscopy:  Normal hypopharynx. - Normal esophagus. - Z-line irregular, 34 cm from the incisors. - Non-bleeding gastric ulcers with no stigmata of bleeding. - Duodenal ulcer with stigmata of bleed/ pigmented material. Gold probe therapy was not successful and hemostasis achieved with injection of dilute epinephrine. - Non-bleeding duodenal ulcer with no stigmata  of bleeding(second bulbar ulcer) - Normal second portion of the duodenum. - No specimens collected.  07-23-20: chest x-ray:No active disease. Aortic Atherosclerosis    NO NEW EXAMS.     LABS REVIEWED PREVIOUS   01-09-20: wbc 6.9; hgb 12.1; hct 39.7; mcv 105.6 plt 299; glucose 225; bun 112; creat 14.53; k+ 6.4; na++ 132; ca 8.3 liver normal albumin 3.5  01-23-20: k+ 6.6  01-23-20: wbc 7.9; hgb 11.6; hct 36.9; mcv 103.7 plt 359; glucose 139; bun 109; creat 15.46; k+ 6.1; na++ 140; ca 8.7 liver normal albumin 4.1;  01-24-20: k+ 4.4  01-30-20: wbc 10.6; hgb 9.7; hct 32.0; mcv 103.6 plt 390; glucose 164; bun 44; creat 9.08; k+ 4.1; na++ 139; ca 9.0 liver normal albumin 3.3 02-06-20: wbc 7.8; hgb 9.9; hct 32.7 mcv 104.8 plt 344; glucose 211; bun 90; creat 10.97; k+ 6.0 na++ 137; ca 8.8 liver normal albumin 3.4  03-26-20: wbc 6.3; hgb 9.4; hct 30.4; mcv 104.1; plt 300; glucose 223; bun 101; creat 14.22; k+ 5.8; na++ 135; ca 8.1 GFR 2; liver normal albumin 3.4  05-21-20; wbc 5.1; hgb 10.4; hct 32.8; mcv 106.8 plt 303; glucose 186; bun 41; creat 1.54; k+ 5.0; na++ 132; ca 8.4 GFR 5; liver normal albumin 3.7 06-13-20: chol 189; ldl 106; trig 211; hdl 41; hgb a1c 6.2   07-11-20: wbc 9.1; hgb 10.6; hct 34.6; mcv 109.1 plt 248; glucose 79; bun 45; creat 7.71 k+ 4.9; na++ 134; ca 8.1 GFR 5; liver normal albumin 3.5 mag 2.4 07-13-20: wbc 11.4; hgb 8.9; hct 29.7; mcv 108.8 plt 303; glucose 178; bun 66; creat 11.17; k+ 6.6; na++ 135; ca 8.2; GFR 3 07-15-20: wbc 11.0; hgb 7.5; hct 25.2; mcv 108.2 plt 251; glucose 117; bun 60; creat 9.41; k+ 5.5; na++ 138; ca  8.4 GFR 4 07-17-20: wbc 11.5; hgb 9.4; hct 29.8; mcv 100.3 plt 300; glucose 140; bun 68; creat 8.04; k+ 4.2; na++ 135; ca 8.5 GFR 5 07-22-20: wbc 11.6; hgb 7.1; hct 23.5; mcv 104.0 plt 444; glucose 214; bun 97; creat 9.18; k+ 4.5; na++ 140; ca 8.0; GFR4 07-24-20: wbc 14.3; hgb 6.7; hct 21.2; mcv 99.5 plt 385; glucose 96; bun 49; creat 6.55; k+ 4.7; na++ 133; ca 8.0; GFR4; mag 2.0 phos 3.8 07-26-20: wbc 10.4; hgb 8.5; hct 26.8; mcv 98.9 plt 430; glucose 111; bun 40;creat 6.90; k+ 4.2; na++ 133; ca 8.5; GFR 6; mag 2.2; phos 3.3    07-30-20: wbc 7.6; hgb 7.3; hct 24.1; mcv 103.4 plt 345; glucose 97; bun 26; creat 5.89; k+ 4.1; na++ 139; ca 8.6; GFR 7; liver normal albumin 2.8.  09-03-20: wbc 5.8; hgb 11.2; hct 37.3; mcv 102.8 plt 274; glucose 175; bun 38; creat 5.72; k+ 3.1; na++ 138; ca 9.2 GFR 7 alk phos 138; albumin 3.4 mag 2.0 09-30-20: wbc 5.6; hgb 13.1; hct 42.6; mcv 97.9 plt 260; glucose 92; bun 23; creat 4.18; k+ 3.1; na++ 134; ca 8.7; GFR 10; liver normal albumin 3.8 10-29-20: wbc 5.3; hgb 12.1; hct 40.0; mcv 96.6 plt 218; glucose 190; bun 54; creat 7.35 k+ 4.0; na++ 134; ca 9.4; GFR 5 liver normal albumin 3.7; mag 2.3 11-19-20: wbc 10.9; hgb 11.3; hct 37.2; mcv 96.6 plt 242; glucose 106; bun 47; creat 6.58; k+ 3.9; na++ 133; ca 9.0; GFR 6 liver normal albumin 3.5  12-02-20: hgb a1c 5.6; chol 167; ldl 83; trig 229; hdl 38 12-03-20; wbc 5.2; hgb 11.1; hct 35.7; mcv 96.5 plt 320; glucose 139; bun 47; creat  6.05; k+ 4.1; na++ 134; ca 9.1; GFR 6; liver normal albumin 3.8 mag 2.4  12-17-20: wbc 5.2; hgb 10.5; hct 33.8; mcv 98.8 plt 256; glucose 136; bun 43; creat 6.19; k+ 3.6; na++ 137; ca 8.9; GFR 6 liver normal albumin 3.9   NO NEW LABS.   Review of Systems  Constitutional:  Negative for malaise/fatigue.  Respiratory:  Negative for cough and shortness of breath.   Cardiovascular:  Negative for chest pain, palpitations and leg swelling.  Gastrointestinal:  Negative for abdominal pain, constipation and  heartburn.  Musculoskeletal:  Negative for back pain, joint pain and myalgias.  Skin: Negative.   Neurological:  Negative for dizziness.  Psychiatric/Behavioral:  The patient is not nervous/anxious.    Physical Exam Constitutional:      General: She is not in acute distress.    Appearance: She is well-developed. She is not diaphoretic.  Neck:     Thyroid: No thyromegaly.  Cardiovascular:     Rate and Rhythm: Normal rate and regular rhythm.     Heart sounds: Normal heart sounds.  Pulmonary:     Effort: Pulmonary effort is normal. No respiratory distress.     Breath sounds: Normal breath sounds.  Chest:  Breasts:    Right: Absent.  Abdominal:     General: Bowel sounds are normal. There is no distension.     Palpations: Abdomen is soft.     Tenderness: There is no abdominal tenderness.  Musculoskeletal:     Cervical back: Neck supple.     Right lower leg: No edema.     Left lower leg: No edema.     Comments:  Right hip IM nail 07-12-20    Is able to move all extremities   Lymphadenopathy:     Cervical: No cervical adenopathy.  Skin:    General: Skin is warm and dry.     Comments: Chest wall dialysis access   Neurological:     Mental Status: She is alert. Mental status is at baseline.  Psychiatric:        Mood and Affect: Mood normal.      ASSESSMENT/ PLAN:  TODAY  Aortic atherosclerosis Atherosclerotic peripheral vascular disease Diabetes mellitus with ESRD (end stage renal disease) Vascular dementia with behavioral disturbance  Will continue current medications Will continue current plan of care Will continue to monitor her status.    Time spent with patient: 40 minutes: medications plan of care.    Ok Edwards NP [facility]  Contact [phone] Monday through Friday 8am- 5pm  After hours call [phone]

## 2021-01-13 ENCOUNTER — Other Ambulatory Visit: Payer: Self-pay

## 2021-01-13 DIAGNOSIS — Z992 Dependence on renal dialysis: Secondary | ICD-10-CM | POA: Diagnosis not present

## 2021-01-13 DIAGNOSIS — N186 End stage renal disease: Secondary | ICD-10-CM | POA: Diagnosis not present

## 2021-01-13 LAB — PROTEIN ELECTROPHORESIS, SERUM
A/G Ratio: 1.5 (ref 0.7–1.7)
Albumin ELP: 4.1 g/dL (ref 2.9–4.4)
Alpha-1-Globulin: 0.2 g/dL (ref 0.0–0.4)
Alpha-2-Globulin: 0.9 g/dL (ref 0.4–1.0)
Beta Globulin: 1 g/dL (ref 0.7–1.3)
Gamma Globulin: 0.6 g/dL (ref 0.4–1.8)
Globulin, Total: 2.7 g/dL (ref 2.2–3.9)
Total Protein ELP: 6.8 g/dL (ref 6.0–8.5)

## 2021-01-13 NOTE — Patient Outreach (Signed)
[facility]) Care Management  01/13/2021  Carly Wood [birth year] [id number]   Referral Date: 01/10/21  Referral Source: [employer] Report Date of Discharge: 01/09/21 Facility:  [place]: [facility]   Referral received.  Patient discharged to SNF  Plan: RN CM will close case.    Jone Baseman, RN, MSN [facility] Care Management Care Management Coordinator Direct Line [phone] Toll Free: [phone]  Fax: [phone]

## 2021-01-14 LAB — IMMUNOFIXATION ELECTROPHORESIS
IgA: 262 mg/dL (ref 64–422)
IgG (Immunoglobin G), Serum: 834 mg/dL (ref 586–1602)
IgM (Immunoglobulin M), Srm: 12 mg/dL — ABNORMAL LOW (ref 26–217)
Total Protein ELP: 6.9 g/dL (ref 6.0–8.5)

## 2021-01-15 DIAGNOSIS — N186 End stage renal disease: Secondary | ICD-10-CM | POA: Diagnosis not present

## 2021-01-15 DIAGNOSIS — Z992 Dependence on renal dialysis: Secondary | ICD-10-CM | POA: Diagnosis not present

## 2021-01-17 DIAGNOSIS — N186 End stage renal disease: Secondary | ICD-10-CM | POA: Diagnosis not present

## 2021-01-17 DIAGNOSIS — Z992 Dependence on renal dialysis: Secondary | ICD-10-CM | POA: Diagnosis not present

## 2021-01-20 DIAGNOSIS — Z992 Dependence on renal dialysis: Secondary | ICD-10-CM | POA: Diagnosis not present

## 2021-01-20 DIAGNOSIS — N186 End stage renal disease: Secondary | ICD-10-CM | POA: Diagnosis not present

## 2021-01-22 DIAGNOSIS — N186 End stage renal disease: Secondary | ICD-10-CM | POA: Diagnosis not present

## 2021-01-22 DIAGNOSIS — Z992 Dependence on renal dialysis: Secondary | ICD-10-CM | POA: Diagnosis not present

## 2021-01-23 ENCOUNTER — Inpatient Hospital Stay (HOSPITAL_COMMUNITY): Payer: Medicare PPO

## 2021-01-23 ENCOUNTER — Other Ambulatory Visit: Payer: Self-pay

## 2021-01-23 VITALS — BP 126/65 | HR 60 | Temp 97.2°F | Resp 18 | Wt 132.8 lb

## 2021-01-23 DIAGNOSIS — E1122 Type 2 diabetes mellitus with diabetic chronic kidney disease: Secondary | ICD-10-CM | POA: Diagnosis not present

## 2021-01-23 DIAGNOSIS — M8580 Other specified disorders of bone density and structure, unspecified site: Secondary | ICD-10-CM | POA: Diagnosis not present

## 2021-01-23 DIAGNOSIS — C9000 Multiple myeloma not having achieved remission: Secondary | ICD-10-CM | POA: Diagnosis not present

## 2021-01-23 DIAGNOSIS — Z5111 Encounter for antineoplastic chemotherapy: Secondary | ICD-10-CM | POA: Diagnosis not present

## 2021-01-23 DIAGNOSIS — Z9011 Acquired absence of right breast and nipple: Secondary | ICD-10-CM | POA: Diagnosis not present

## 2021-01-23 DIAGNOSIS — Z79811 Long term (current) use of aromatase inhibitors: Secondary | ICD-10-CM | POA: Diagnosis not present

## 2021-01-23 DIAGNOSIS — C50919 Malignant neoplasm of unspecified site of unspecified female breast: Secondary | ICD-10-CM | POA: Diagnosis not present

## 2021-01-23 DIAGNOSIS — F0390 Unspecified dementia without behavioral disturbance: Secondary | ICD-10-CM | POA: Diagnosis not present

## 2021-01-23 DIAGNOSIS — Z170 Estrogen receptor positive status [ER+]: Secondary | ICD-10-CM | POA: Diagnosis not present

## 2021-01-23 LAB — COMPREHENSIVE METABOLIC PANEL
ALT: 17 U/L (ref 0–44)
AST: 21 U/L (ref 15–41)
Albumin: 4 g/dL (ref 3.5–5.0)
Alkaline Phosphatase: 104 U/L (ref 38–126)
Anion gap: 16 — ABNORMAL HIGH (ref 5–15)
BUN: 46 mg/dL — ABNORMAL HIGH (ref 8–23)
CO2: 23 mmol/L (ref 22–32)
Calcium: 9.3 mg/dL (ref 8.9–10.3)
Chloride: 99 mmol/L (ref 98–111)
Creatinine, Ser: 6.02 mg/dL — ABNORMAL HIGH (ref 0.44–1.00)
GFR, Estimated: 7 mL/min — ABNORMAL LOW (ref >60)
Glucose, Bld: 202 mg/dL — ABNORMAL HIGH (ref 70–99)
Potassium: 3.7 mmol/L (ref 3.5–5.1)
Sodium: 138 mmol/L (ref 135–145)
Total Bilirubin: 0.4 mg/dL (ref 0.3–1.2)
Total Protein: 7.6 g/dL (ref 6.5–8.1)

## 2021-01-23 LAB — CBC WITH DIFFERENTIAL/PLATELET
Abs Immature Granulocytes: 0.04 10*3/uL (ref 0.00–0.07)
Basophils Absolute: 0 10*3/uL (ref 0.0–0.1)
Basophils Relative: 0 %
Eosinophils Absolute: 0.1 10*3/uL (ref 0.0–0.5)
Eosinophils Relative: 1 %
HCT: 37.4 % (ref 36.0–46.0)
Hemoglobin: 12 g/dL (ref 12.0–15.0)
Immature Granulocytes: 1 %
Lymphocytes Relative: 15 %
Lymphs Abs: 1.2 10*3/uL (ref 0.7–4.0)
MCH: 32 pg (ref 26.0–34.0)
MCHC: 32.1 g/dL (ref 30.0–36.0)
MCV: 99.7 fL (ref 80.0–100.0)
Monocytes Absolute: 0.6 10*3/uL (ref 0.1–1.0)
Monocytes Relative: 7 %
Neutro Abs: 6.2 10*3/uL (ref 1.7–7.7)
Neutrophils Relative %: 76 %
Platelets: 231 10*3/uL (ref 150–400)
RBC: 3.75 MIL/uL — ABNORMAL LOW (ref 3.87–5.11)
RDW: 18.2 % — ABNORMAL HIGH (ref 11.5–15.5)
WBC: 8.1 10*3/uL (ref 4.0–10.5)
nRBC: 0 % (ref 0.0–0.2)

## 2021-01-23 LAB — MAGNESIUM: Magnesium: 2 mg/dL (ref 1.7–2.4)

## 2021-01-23 MED ORDER — BORTEZOMIB CHEMO SQ INJECTION 3.5 MG (2.5MG/ML)
1.3000 mg/m2 | Freq: Once | INTRAMUSCULAR | Status: AC
  Administered 2021-01-23: 11:00:00 2.25 mg via SUBCUTANEOUS
  Filled 2021-01-23: qty 0.9

## 2021-01-23 MED ORDER — DEXAMETHASONE 4 MG PO TABS
10.0000 mg | ORAL_TABLET | Freq: Once | ORAL | Status: AC
  Administered 2021-01-23: 11:00:00 10 mg via ORAL
  Filled 2021-01-23: qty 3

## 2021-01-23 MED ORDER — PROCHLORPERAZINE MALEATE 10 MG PO TABS
10.0000 mg | ORAL_TABLET | Freq: Once | ORAL | Status: AC
  Administered 2021-01-23: 11:00:00 10 mg via ORAL
  Filled 2021-01-23: qty 1

## 2021-01-23 NOTE — Patient Instructions (Signed)
[facility]  Discharge Instructions: Thank you for choosing [facility] to provide your oncology and hematology care.  If you have a lab appointment with the Cancer Center, please come in thru the Main Entrance and check in at the main information desk.  Wear comfortable clothing and clothing appropriate for easy access to any Portacath or PICC line.   We strive to give you quality time with your provider. You may need to reschedule your appointment if you arrive late (15 or more minutes).  Arriving late affects you and other patients whose appointments are after yours.  Also, if you miss three or more appointments without notifying the office, you may be dismissed from the clinic at the provider's discretion.      For prescription refill requests, have your pharmacy contact our office and allow 72 hours for refills to be completed.        To help prevent nausea and vomiting after your treatment, we encourage you to take your nausea medication as directed.  BELOW ARE SYMPTOMS THAT SHOULD BE REPORTED IMMEDIATELY: *FEVER GREATER THAN 100.4 F (38 C) OR HIGHER *CHILLS OR SWEATING *NAUSEA AND VOMITING THAT IS NOT CONTROLLED WITH YOUR NAUSEA MEDICATION *UNUSUAL SHORTNESS OF BREATH *UNUSUAL BRUISING OR BLEEDING *URINARY PROBLEMS (pain or burning when urinating, or frequent urination) *BOWEL PROBLEMS (unusual diarrhea, constipation, pain near the anus) TENDERNESS IN MOUTH AND THROAT WITH OR WITHOUT PRESENCE OF ULCERS (sore throat, sores in mouth, or a toothache) UNUSUAL RASH, SWELLING OR PAIN  UNUSUAL VAGINAL DISCHARGE OR ITCHING   Items with * indicate a potential emergency and should be followed up as soon as possible or go to the Emergency Department if any problems should occur.  Please show the CHEMOTHERAPY ALERT CARD or IMMUNOTHERAPY ALERT CARD at check-in to the Emergency Department and triage nurse.  Should you have questions after your visit or need to cancel  or reschedule your appointment, please contact [facility] [phone]  and follow the prompts.  Office hours are 8:00 a.m. to 4:30 p.m. Monday - Friday. Please note that voicemails left after 4:00 p.m. may not be returned until the following business day.  We are closed weekends and major holidays. You have access to a nurse at all times for urgent questions. Please call the main number to the clinic [phone] and follow the prompts.  For any non-urgent questions, you may also contact your provider using MyChart. We now offer e-Visits for anyone 18 and older to request care online for non-urgent symptoms. For details visit [email or web address].   Also download the MyChart app! Go to the app store, search "MyChart", open the app, select [facility], and log in with your MyChart username and password.  Due to Covid, a mask is required upon entering the hospital/clinic. If you do not have a mask, one will be given to you upon arrival. For doctor visits, patients may have 1 support person aged 18 or older with them. For treatment visits, patients cannot have anyone with them due to current Covid guidelines and our immunocompromised population.  

## 2021-01-23 NOTE — Progress Notes (Signed)
Labs reviewed , kidney function staying around the same. Will continue to treat.   Treatment given per orders. Patient tolerated it well without problems. Vitals stable and discharged to [facility] via wheelchair. Follow up as scheduled.

## 2021-01-24 DIAGNOSIS — N186 End stage renal disease: Secondary | ICD-10-CM | POA: Diagnosis not present

## 2021-01-24 DIAGNOSIS — Z992 Dependence on renal dialysis: Secondary | ICD-10-CM | POA: Diagnosis not present

## 2021-01-27 DIAGNOSIS — Z992 Dependence on renal dialysis: Secondary | ICD-10-CM | POA: Diagnosis not present

## 2021-01-27 DIAGNOSIS — N186 End stage renal disease: Secondary | ICD-10-CM | POA: Diagnosis not present

## 2021-01-29 DIAGNOSIS — Z992 Dependence on renal dialysis: Secondary | ICD-10-CM | POA: Diagnosis not present

## 2021-01-29 DIAGNOSIS — N186 End stage renal disease: Secondary | ICD-10-CM | POA: Diagnosis not present

## 2021-01-31 DIAGNOSIS — Z992 Dependence on renal dialysis: Secondary | ICD-10-CM | POA: Diagnosis not present

## 2021-01-31 DIAGNOSIS — N186 End stage renal disease: Secondary | ICD-10-CM | POA: Diagnosis not present

## 2021-02-03 DIAGNOSIS — N186 End stage renal disease: Secondary | ICD-10-CM | POA: Diagnosis not present

## 2021-02-03 DIAGNOSIS — Z992 Dependence on renal dialysis: Secondary | ICD-10-CM | POA: Diagnosis not present

## 2021-02-04 ENCOUNTER — Encounter: Payer: Self-pay | Admitting: Internal Medicine

## 2021-02-04 ENCOUNTER — Non-Acute Institutional Stay (SKILLED_NURSING_FACILITY): Payer: Medicare PPO | Admitting: Internal Medicine

## 2021-02-04 DIAGNOSIS — E1122 Type 2 diabetes mellitus with diabetic chronic kidney disease: Secondary | ICD-10-CM | POA: Diagnosis not present

## 2021-02-04 DIAGNOSIS — Z992 Dependence on renal dialysis: Secondary | ICD-10-CM | POA: Diagnosis not present

## 2021-02-04 DIAGNOSIS — D631 Anemia in chronic kidney disease: Secondary | ICD-10-CM | POA: Diagnosis not present

## 2021-02-04 DIAGNOSIS — I120 Hypertensive chronic kidney disease with stage 5 chronic kidney disease or end stage renal disease: Secondary | ICD-10-CM | POA: Diagnosis not present

## 2021-02-04 DIAGNOSIS — N186 End stage renal disease: Secondary | ICD-10-CM | POA: Diagnosis not present

## 2021-02-04 DIAGNOSIS — R609 Edema, unspecified: Secondary | ICD-10-CM

## 2021-02-04 NOTE — Patient Instructions (Signed)
See assessment and plan under each diagnosis in the problem list and acutely for this visit 

## 2021-02-04 NOTE — Assessment & Plan Note (Signed)
01/23/2021 anemia resolved with H/H of 12/37.4 with normochromic, normocytic indices.  No bleeding dyscrasias reported at the SNF.

## 2021-02-04 NOTE — Assessment & Plan Note (Signed)
BP controlled; no change in antihypertensive medications  

## 2021-02-04 NOTE — Progress Notes (Signed)
NURSING HOME LOCATION:  [facility] ROOM NUMBER:  [id number] D  CODE STATUS:  DNR  [id number] Green NP  This is a nursing facility follow up visit of chronic medical diagnoses & to document compliance with Regulation 483.30 (c) in [employer] [facility] Manual Phase 2 which mandates caregiver visit ( visits can alternate among physician, PA or NP as per statutes) within 10 days of 30 days / 60 days/ 90 days post admission to SNF date    Interim medical record and care since last SNF visit was updated with review of diagnostic studies and change in clinical status since last visit were documented.  HPI: She is a permanent resident of the facility with medical diagnoses of multiple myeloma, diabetes with ESRD on hemodialysis, dementia, GERD, essential hypertension, and PVD. She has had multiple vascular procedures related to HD and PVD as well as partial mastectomy.  Labs are checked 3 times a week at HD.  Current labs in Epic revealed a creatinine of 6.02 with a GFR of 7.  H/H were 12/37.4 , up from 11.7/36.9  with normochromic normocytic indices.  Glucoses here at the SNF vary from 78 up to 156.  A1c was 5.6% on 12/02/2020.  Review of systems: She complains that the port for dialysis "stings".  She states she has informed the staff at HD but there has been no intervention to date.  She broke her glasses and is awaiting a new pair impacting visual acuity. She describes occasional dyspnea with exertion.  She also states that edema of her lower extremities is so bad that she must wear bedroom/slippers.  She describes numbness in the fingers of her right hand.  Constitutional: No fever, significant weight change  Eyes: No redness, discharge, pain ENT/mouth: No nasal congestion,  purulent discharge, earache, change in hearing, sore throat  Cardiovascular: No chest pain, palpitations, paroxysmal nocturnal dyspnea, claudication  Respiratory: No cough, sputum production, hemoptysis,  significant snoring, apnea   Gastrointestinal: No heartburn, dysphagia, abdominal pain, nausea /vomiting, rectal bleeding, melena, change in bowels Genitourinary: No dysuria, hematuria, pyuria, incontinence, nocturia Musculoskeletal: No joint stiffness, joint swelling, weakness, pain Dermatologic: No rash, pruritus, change in appearance of skin Neurologic: No dizziness, headache, syncope, seizures Psychiatric: No significant anxiety, depression, insomnia, anorexia Endocrine: No change in hair/skin/nails, excessive thirst, excessive hunger, excessive urination  Hematologic/lymphatic: No significant bruising, lymphadenopathy, abnormal bleeding Allergy/immunology: No itchy/watery eyes, significant sneezing, urticaria, angioedema  Physical exam:  Pertinent or positive findings: Eyebrows are decreased in intensity.  There is slight decrease in the left nasolabial fold.  She retains most of her teeth.  There is faint hirsutism over the chin.  First and second heart sounds are accentuated.  Breath sounds are decreased.  Abdomen is protuberant.  Pedal pulses are decreased.  There is trace edema at the sock line but no significant pedal edema despite her comments.  General appearance:  no acute distress, increased work of breathing is present.   Lymphatic: No lymphadenopathy about the head, neck, axilla. Eyes: No conjunctival inflammation or lid edema is present. There is no scleral icterus. Ears:  External ear exam shows no significant lesions or deformities.   Nose:  External nasal examination shows no deformity or inflammation. Nasal mucosa are pink and moist without lesions, exudates Neck:  No thyromegaly, masses, tenderness noted.    Heart:  Normal rate and regular rhythm without gallop, murmur, click, rub .  Lungs:  without wheezes, rhonchi, rales, rubs. Abdomen: Bowel sounds are  normal. Abdomen is soft and nontender with no organomegaly, hernias, masses. GU: Deferred  Extremities:  No cyanosis,  clubbing Neurologic exam :Balance, Rhomberg, finger to nose testing could not be completed due to clinical state Skin: Warm & dry w/o tenting. No significant lesions or rash.  See summary under each active problem in the Problem List with associated updated therapeutic plan

## 2021-02-04 NOTE — Assessment & Plan Note (Addendum)
Most recent recorded creatinine was 6.02 with a GFR of 7confirming ESRD.  Glucoses range from 78 up to 156 at the SNF.  A1c on 12/02/2020 was 5.6%.  There may be discordance between A1c and glucoses because of the ESRD.

## 2021-02-05 DIAGNOSIS — N186 End stage renal disease: Secondary | ICD-10-CM | POA: Diagnosis not present

## 2021-02-05 DIAGNOSIS — Z992 Dependence on renal dialysis: Secondary | ICD-10-CM | POA: Diagnosis not present

## 2021-02-06 ENCOUNTER — Other Ambulatory Visit: Payer: Self-pay

## 2021-02-06 ENCOUNTER — Inpatient Hospital Stay (HOSPITAL_COMMUNITY): Payer: Medicare PPO

## 2021-02-06 ENCOUNTER — Inpatient Hospital Stay (HOSPITAL_COMMUNITY): Payer: Medicare PPO | Attending: Hematology

## 2021-02-06 VITALS — BP 114/74 | HR 54 | Temp 97.1°F | Resp 18 | Wt 139.7 lb

## 2021-02-06 DIAGNOSIS — Z79811 Long term (current) use of aromatase inhibitors: Secondary | ICD-10-CM | POA: Insufficient documentation

## 2021-02-06 DIAGNOSIS — Z9011 Acquired absence of right breast and nipple: Secondary | ICD-10-CM | POA: Insufficient documentation

## 2021-02-06 DIAGNOSIS — K219 Gastro-esophageal reflux disease without esophagitis: Secondary | ICD-10-CM | POA: Diagnosis not present

## 2021-02-06 DIAGNOSIS — R5383 Other fatigue: Secondary | ICD-10-CM | POA: Insufficient documentation

## 2021-02-06 DIAGNOSIS — M8580 Other specified disorders of bone density and structure, unspecified site: Secondary | ICD-10-CM | POA: Diagnosis not present

## 2021-02-06 DIAGNOSIS — C9000 Multiple myeloma not having achieved remission: Secondary | ICD-10-CM | POA: Diagnosis not present

## 2021-02-06 DIAGNOSIS — F32A Depression, unspecified: Secondary | ICD-10-CM | POA: Diagnosis not present

## 2021-02-06 DIAGNOSIS — R609 Edema, unspecified: Secondary | ICD-10-CM | POA: Insufficient documentation

## 2021-02-06 DIAGNOSIS — N186 End stage renal disease: Secondary | ICD-10-CM | POA: Diagnosis not present

## 2021-02-06 DIAGNOSIS — E785 Hyperlipidemia, unspecified: Secondary | ICD-10-CM | POA: Diagnosis not present

## 2021-02-06 DIAGNOSIS — E1122 Type 2 diabetes mellitus with diabetic chronic kidney disease: Secondary | ICD-10-CM | POA: Insufficient documentation

## 2021-02-06 DIAGNOSIS — Z5111 Encounter for antineoplastic chemotherapy: Secondary | ICD-10-CM | POA: Diagnosis not present

## 2021-02-06 DIAGNOSIS — F0390 Unspecified dementia without behavioral disturbance: Secondary | ICD-10-CM | POA: Insufficient documentation

## 2021-02-06 DIAGNOSIS — I120 Hypertensive chronic kidney disease with stage 5 chronic kidney disease or end stage renal disease: Secondary | ICD-10-CM | POA: Insufficient documentation

## 2021-02-06 DIAGNOSIS — Z170 Estrogen receptor positive status [ER+]: Secondary | ICD-10-CM | POA: Diagnosis not present

## 2021-02-06 DIAGNOSIS — C50411 Malignant neoplasm of upper-outer quadrant of right female breast: Secondary | ICD-10-CM | POA: Diagnosis not present

## 2021-02-06 LAB — CBC WITH DIFFERENTIAL/PLATELET
Abs Immature Granulocytes: 0.02 10*3/uL (ref 0.00–0.07)
Basophils Absolute: 0 10*3/uL (ref 0.0–0.1)
Basophils Relative: 1 %
Eosinophils Absolute: 0.1 10*3/uL (ref 0.0–0.5)
Eosinophils Relative: 2 %
HCT: 37.1 % (ref 36.0–46.0)
Hemoglobin: 11.8 g/dL — ABNORMAL LOW (ref 12.0–15.0)
Immature Granulocytes: 1 %
Lymphocytes Relative: 22 %
Lymphs Abs: 1 10*3/uL (ref 0.7–4.0)
MCH: 31.6 pg (ref 26.0–34.0)
MCHC: 31.8 g/dL (ref 30.0–36.0)
MCV: 99.2 fL (ref 80.0–100.0)
Monocytes Absolute: 0.5 10*3/uL (ref 0.1–1.0)
Monocytes Relative: 11 %
Neutro Abs: 2.7 10*3/uL (ref 1.7–7.7)
Neutrophils Relative %: 63 %
Platelets: 230 10*3/uL (ref 150–400)
RBC: 3.74 MIL/uL — ABNORMAL LOW (ref 3.87–5.11)
RDW: 16.9 % — ABNORMAL HIGH (ref 11.5–15.5)
WBC: 4.3 10*3/uL (ref 4.0–10.5)
nRBC: 0 % (ref 0.0–0.2)

## 2021-02-06 LAB — MAGNESIUM: Magnesium: 2.1 mg/dL (ref 1.7–2.4)

## 2021-02-06 LAB — COMPREHENSIVE METABOLIC PANEL
ALT: 20 U/L (ref 0–44)
AST: 22 U/L (ref 15–41)
Albumin: 3.7 g/dL (ref 3.5–5.0)
Alkaline Phosphatase: 102 U/L (ref 38–126)
Anion gap: 13 (ref 5–15)
BUN: 43 mg/dL — ABNORMAL HIGH (ref 8–23)
CO2: 27 mmol/L (ref 22–32)
Calcium: 9 mg/dL (ref 8.9–10.3)
Chloride: 93 mmol/L — ABNORMAL LOW (ref 98–111)
Creatinine, Ser: 6.08 mg/dL — ABNORMAL HIGH (ref 0.44–1.00)
GFR, Estimated: 6 mL/min — ABNORMAL LOW (ref >60)
Glucose, Bld: 202 mg/dL — ABNORMAL HIGH (ref 70–99)
Potassium: 4.3 mmol/L (ref 3.5–5.1)
Sodium: 133 mmol/L — ABNORMAL LOW (ref 135–145)
Total Bilirubin: 0.4 mg/dL (ref 0.3–1.2)
Total Protein: 6.6 g/dL (ref 6.5–8.1)

## 2021-02-06 MED ORDER — PROCHLORPERAZINE MALEATE 10 MG PO TABS
10.0000 mg | ORAL_TABLET | Freq: Once | ORAL | Status: AC
  Administered 2021-02-06: 10 mg via ORAL
  Filled 2021-02-06: qty 1

## 2021-02-06 MED ORDER — BORTEZOMIB CHEMO SQ INJECTION 3.5 MG (2.5MG/ML)
1.3000 mg/m2 | Freq: Once | INTRAMUSCULAR | Status: AC
  Administered 2021-02-06: 2.25 mg via SUBCUTANEOUS
  Filled 2021-02-06: qty 0.9

## 2021-02-06 MED ORDER — DEXAMETHASONE 4 MG PO TABS
10.0000 mg | ORAL_TABLET | Freq: Once | ORAL | Status: AC
  Administered 2021-02-06: 10 mg via ORAL
  Filled 2021-02-06: qty 3

## 2021-02-06 NOTE — Assessment & Plan Note (Signed)
She describes edema severe enough to necessitate wearing bedroom slippers rather than shoes. Exam reveals only trace edema @ sock line. CHF clinically not present as no NVD, rales or significant peripheral edema.

## 2021-02-06 NOTE — Patient Instructions (Signed)
[facility]  Discharge Instructions: Thank you for choosing [facility] to provide your oncology and hematology care.  If you have a lab appointment with the Cancer Center, please come in thru the Main Entrance and check in at the main information desk.  Wear comfortable clothing and clothing appropriate for easy access to any Portacath or PICC line.   We strive to give you quality time with your provider. You may need to reschedule your appointment if you arrive late (15 or more minutes).  Arriving late affects you and other patients whose appointments are after yours.  Also, if you miss three or more appointments without notifying the office, you may be dismissed from the clinic at the provider's discretion.      For prescription refill requests, have your pharmacy contact our office and allow 72 hours for refills to be completed.        To help prevent nausea and vomiting after your treatment, we encourage you to take your nausea medication as directed.  BELOW ARE SYMPTOMS THAT SHOULD BE REPORTED IMMEDIATELY: *FEVER GREATER THAN 100.4 F (38 C) OR HIGHER *CHILLS OR SWEATING *NAUSEA AND VOMITING THAT IS NOT CONTROLLED WITH YOUR NAUSEA MEDICATION *UNUSUAL SHORTNESS OF BREATH *UNUSUAL BRUISING OR BLEEDING *URINARY PROBLEMS (pain or burning when urinating, or frequent urination) *BOWEL PROBLEMS (unusual diarrhea, constipation, pain near the anus) TENDERNESS IN MOUTH AND THROAT WITH OR WITHOUT PRESENCE OF ULCERS (sore throat, sores in mouth, or a toothache) UNUSUAL RASH, SWELLING OR PAIN  UNUSUAL VAGINAL DISCHARGE OR ITCHING   Items with * indicate a potential emergency and should be followed up as soon as possible or go to the Emergency Department if any problems should occur.  Please show the CHEMOTHERAPY ALERT CARD or IMMUNOTHERAPY ALERT CARD at check-in to the Emergency Department and triage nurse.  Should you have questions after your visit or need to cancel  or reschedule your appointment, please contact [facility] [phone]  and follow the prompts.  Office hours are 8:00 a.m. to 4:30 p.m. Monday - Friday. Please note that voicemails left after 4:00 p.m. may not be returned until the following business day.  We are closed weekends and major holidays. You have access to a nurse at all times for urgent questions. Please call the main number to the clinic [phone] and follow the prompts.  For any non-urgent questions, you may also contact your provider using MyChart. We now offer e-Visits for anyone 18 and older to request care online for non-urgent symptoms. For details visit [email or web address].   Also download the MyChart app! Go to the app store, search "MyChart", open the app, select [facility], and log in with your MyChart username and password.  Due to Covid, a mask is required upon entering the hospital/clinic. If you do not have a mask, one will be given to you upon arrival. For doctor visits, patients may have 1 support person aged 18 or older with them. For treatment visits, patients cannot have anyone with them due to current Covid guidelines and our immunocompromised population.  

## 2021-02-06 NOTE — Progress Notes (Signed)
Patient presents today for Velcade injection.  Patient is in satisfactory condition with no complaints voiced.  Vital signs are stable.  Creatinine today is 6.08.  Dr. Delton Coombes was notified.  We will proceed with treatment per MD orders.  All other labs are within treatment parameters.   Patient tolerated Velcade injection with no complaints voiced.  Site clean and dry with no bruising or swelling noted.  No complaints of pain.  Discharged with vital signs stable and no signs or symptoms of distress noted.

## 2021-02-07 DIAGNOSIS — Z992 Dependence on renal dialysis: Secondary | ICD-10-CM | POA: Diagnosis not present

## 2021-02-07 DIAGNOSIS — N2581 Secondary hyperparathyroidism of renal origin: Secondary | ICD-10-CM | POA: Diagnosis not present

## 2021-02-07 DIAGNOSIS — N186 End stage renal disease: Secondary | ICD-10-CM | POA: Diagnosis not present

## 2021-02-10 DIAGNOSIS — N186 End stage renal disease: Secondary | ICD-10-CM | POA: Diagnosis not present

## 2021-02-10 DIAGNOSIS — N2581 Secondary hyperparathyroidism of renal origin: Secondary | ICD-10-CM | POA: Diagnosis not present

## 2021-02-10 DIAGNOSIS — Z992 Dependence on renal dialysis: Secondary | ICD-10-CM | POA: Diagnosis not present

## 2021-02-12 DIAGNOSIS — H31001 Unspecified chorioretinal scars, right eye: Secondary | ICD-10-CM | POA: Diagnosis not present

## 2021-02-12 DIAGNOSIS — E119 Type 2 diabetes mellitus without complications: Secondary | ICD-10-CM | POA: Diagnosis not present

## 2021-02-12 DIAGNOSIS — N2581 Secondary hyperparathyroidism of renal origin: Secondary | ICD-10-CM | POA: Diagnosis not present

## 2021-02-12 DIAGNOSIS — Z961 Presence of intraocular lens: Secondary | ICD-10-CM | POA: Diagnosis not present

## 2021-02-12 DIAGNOSIS — H25812 Combined forms of age-related cataract, left eye: Secondary | ICD-10-CM | POA: Diagnosis not present

## 2021-02-12 DIAGNOSIS — Z992 Dependence on renal dialysis: Secondary | ICD-10-CM | POA: Diagnosis not present

## 2021-02-12 DIAGNOSIS — N186 End stage renal disease: Secondary | ICD-10-CM | POA: Diagnosis not present

## 2021-02-14 DIAGNOSIS — Z992 Dependence on renal dialysis: Secondary | ICD-10-CM | POA: Diagnosis not present

## 2021-02-14 DIAGNOSIS — N2581 Secondary hyperparathyroidism of renal origin: Secondary | ICD-10-CM | POA: Diagnosis not present

## 2021-02-14 DIAGNOSIS — N186 End stage renal disease: Secondary | ICD-10-CM | POA: Diagnosis not present

## 2021-02-17 DIAGNOSIS — N2581 Secondary hyperparathyroidism of renal origin: Secondary | ICD-10-CM | POA: Diagnosis not present

## 2021-02-17 DIAGNOSIS — Z992 Dependence on renal dialysis: Secondary | ICD-10-CM | POA: Diagnosis not present

## 2021-02-17 DIAGNOSIS — N186 End stage renal disease: Secondary | ICD-10-CM | POA: Diagnosis not present

## 2021-02-18 DIAGNOSIS — L602 Onychogryphosis: Secondary | ICD-10-CM | POA: Diagnosis not present

## 2021-02-18 DIAGNOSIS — E1140 Type 2 diabetes mellitus with diabetic neuropathy, unspecified: Secondary | ICD-10-CM | POA: Diagnosis not present

## 2021-02-18 DIAGNOSIS — L853 Xerosis cutis: Secondary | ICD-10-CM | POA: Diagnosis not present

## 2021-02-18 DIAGNOSIS — Z794 Long term (current) use of insulin: Secondary | ICD-10-CM | POA: Diagnosis not present

## 2021-02-19 DIAGNOSIS — Z992 Dependence on renal dialysis: Secondary | ICD-10-CM | POA: Diagnosis not present

## 2021-02-19 DIAGNOSIS — N186 End stage renal disease: Secondary | ICD-10-CM | POA: Diagnosis not present

## 2021-02-19 DIAGNOSIS — N2581 Secondary hyperparathyroidism of renal origin: Secondary | ICD-10-CM | POA: Diagnosis not present

## 2021-02-20 ENCOUNTER — Inpatient Hospital Stay (HOSPITAL_COMMUNITY): Payer: Medicare PPO

## 2021-02-20 VITALS — BP 141/70 | HR 60 | Temp 96.7°F | Resp 18 | Ht <= 58 in | Wt 130.1 lb

## 2021-02-20 DIAGNOSIS — C9000 Multiple myeloma not having achieved remission: Secondary | ICD-10-CM

## 2021-02-20 DIAGNOSIS — Z170 Estrogen receptor positive status [ER+]: Secondary | ICD-10-CM | POA: Diagnosis not present

## 2021-02-20 DIAGNOSIS — Z5111 Encounter for antineoplastic chemotherapy: Secondary | ICD-10-CM | POA: Diagnosis not present

## 2021-02-20 DIAGNOSIS — N186 End stage renal disease: Secondary | ICD-10-CM | POA: Diagnosis not present

## 2021-02-20 DIAGNOSIS — C50411 Malignant neoplasm of upper-outer quadrant of right female breast: Secondary | ICD-10-CM | POA: Diagnosis not present

## 2021-02-20 DIAGNOSIS — R5383 Other fatigue: Secondary | ICD-10-CM | POA: Diagnosis not present

## 2021-02-20 DIAGNOSIS — E1122 Type 2 diabetes mellitus with diabetic chronic kidney disease: Secondary | ICD-10-CM | POA: Diagnosis not present

## 2021-02-20 DIAGNOSIS — Z79811 Long term (current) use of aromatase inhibitors: Secondary | ICD-10-CM | POA: Diagnosis not present

## 2021-02-20 DIAGNOSIS — Z9011 Acquired absence of right breast and nipple: Secondary | ICD-10-CM | POA: Diagnosis not present

## 2021-02-20 LAB — CBC WITH DIFFERENTIAL/PLATELET
Abs Immature Granulocytes: 0.02 10*3/uL (ref 0.00–0.07)
Basophils Absolute: 0 10*3/uL (ref 0.0–0.1)
Basophils Relative: 1 %
Eosinophils Absolute: 0.2 10*3/uL (ref 0.0–0.5)
Eosinophils Relative: 4 %
HCT: 38.7 % (ref 36.0–46.0)
Hemoglobin: 12 g/dL (ref 12.0–15.0)
Immature Granulocytes: 1 %
Lymphocytes Relative: 18 %
Lymphs Abs: 0.8 10*3/uL (ref 0.7–4.0)
MCH: 31.7 pg (ref 26.0–34.0)
MCHC: 31 g/dL (ref 30.0–36.0)
MCV: 102.4 fL — ABNORMAL HIGH (ref 80.0–100.0)
Monocytes Absolute: 0.5 10*3/uL (ref 0.1–1.0)
Monocytes Relative: 11 %
Neutro Abs: 2.9 10*3/uL (ref 1.7–7.7)
Neutrophils Relative %: 65 %
Platelets: 212 10*3/uL (ref 150–400)
RBC: 3.78 MIL/uL — ABNORMAL LOW (ref 3.87–5.11)
RDW: 17 % — ABNORMAL HIGH (ref 11.5–15.5)
WBC: 4.4 10*3/uL (ref 4.0–10.5)
nRBC: 0 % (ref 0.0–0.2)

## 2021-02-20 LAB — COMPREHENSIVE METABOLIC PANEL
ALT: 14 U/L (ref 0–44)
AST: 20 U/L (ref 15–41)
Albumin: 3.7 g/dL (ref 3.5–5.0)
Alkaline Phosphatase: 96 U/L (ref 38–126)
Anion gap: 13 (ref 5–15)
BUN: 39 mg/dL — ABNORMAL HIGH (ref 8–23)
CO2: 26 mmol/L (ref 22–32)
Calcium: 9.1 mg/dL (ref 8.9–10.3)
Chloride: 98 mmol/L (ref 98–111)
Creatinine, Ser: 6.78 mg/dL — ABNORMAL HIGH (ref 0.44–1.00)
GFR, Estimated: 6 mL/min — ABNORMAL LOW (ref >60)
Glucose, Bld: 125 mg/dL — ABNORMAL HIGH (ref 70–99)
Potassium: 4.2 mmol/L (ref 3.5–5.1)
Sodium: 137 mmol/L (ref 135–145)
Total Bilirubin: 0.7 mg/dL (ref 0.3–1.2)
Total Protein: 6.8 g/dL (ref 6.5–8.1)

## 2021-02-20 LAB — MAGNESIUM: Magnesium: 2.2 mg/dL (ref 1.7–2.4)

## 2021-02-20 MED ORDER — DEXAMETHASONE 4 MG PO TABS
10.0000 mg | ORAL_TABLET | Freq: Once | ORAL | Status: AC
  Administered 2021-02-20: 10 mg via ORAL
  Filled 2021-02-20: qty 3

## 2021-02-20 MED ORDER — PROCHLORPERAZINE MALEATE 10 MG PO TABS
10.0000 mg | ORAL_TABLET | Freq: Once | ORAL | Status: AC
  Administered 2021-02-20: 10 mg via ORAL
  Filled 2021-02-20: qty 1

## 2021-02-20 MED ORDER — BORTEZOMIB CHEMO SQ INJECTION 3.5 MG (2.5MG/ML)
1.3000 mg/m2 | Freq: Once | INTRAMUSCULAR | Status: AC
  Administered 2021-02-20: 2 mg via SUBCUTANEOUS
  Filled 2021-02-20: qty 0.8

## 2021-02-20 NOTE — Progress Notes (Signed)
Per MD update weight for dose decrease to 2mg 

## 2021-02-20 NOTE — Progress Notes (Signed)
Patient tolerated Velcade injection with no complaints voiced. Lab work reviewed. See MAR for details. Injection site clean and dry with no bruising or swelling noted. Patient stable during and after injection. Band aid applied. VSS. Patient left in satisfactory condition with no s/s of distress noted. 

## 2021-02-20 NOTE — Patient Instructions (Signed)
[facility]  Discharge Instructions: Thank you for choosing [facility] to provide your oncology and hematology care.  If you have a lab appointment with the [facility], please come in thru the Main Entrance and check in at the main information desk.  Wear comfortable clothing and clothing appropriate for easy access to any Portacath or PICC line.   We strive to give you quality time with your provider. You may need to reschedule your appointment if you arrive late (15 or more minutes).  Arriving late affects you and other patients whose appointments are after yours.  Also, if you miss three or more appointments without notifying the office, you may be dismissed from the clinic at the providers discretion.      For prescription refill requests, have your pharmacy contact our office and allow 72 hours for refills to be completed.    Today you received the following chemotherapy and/or immunotherapy agents Velcade.    To help prevent nausea and vomiting after your treatment, we encourage you to take your nausea medication as directed.  BELOW ARE SYMPTOMS THAT SHOULD BE REPORTED IMMEDIATELY: *FEVER GREATER THAN 100.4 F (38 C) OR HIGHER *CHILLS OR SWEATING *NAUSEA AND VOMITING THAT IS NOT CONTROLLED WITH YOUR NAUSEA MEDICATION *UNUSUAL SHORTNESS OF BREATH *UNUSUAL BRUISING OR BLEEDING *URINARY PROBLEMS (pain or burning when urinating, or frequent urination) *BOWEL PROBLEMS (unusual diarrhea, constipation, pain near the anus) TENDERNESS IN MOUTH AND THROAT WITH OR WITHOUT PRESENCE OF ULCERS (sore throat, sores in mouth, or a toothache) UNUSUAL RASH, SWELLING OR PAIN  UNUSUAL VAGINAL DISCHARGE OR ITCHING   Items with * indicate a potential emergency and should be followed up as soon as possible or go to the Emergency Department if any problems should occur.  Please show the CHEMOTHERAPY ALERT CARD or IMMUNOTHERAPY ALERT CARD at check-in to the Emergency  Department and triage nurse.  Should you have questions after your visit or need to cancel or reschedule your appointment, please contact [facility] [phone]  and follow the prompts.  Office hours are 8:00 a.m. to 4:30 p.m. Monday - Friday. Please note that voicemails left after 4:00 p.m. may not be returned until the following business day.  We are closed weekends and major holidays. You have access to a nurse at all times for urgent questions. Please call the main number to the clinic [phone] and follow the prompts.  For any non-urgent questions, you may also contact your provider using MyChart. We now offer e-Visits for anyone 24 and older to request care online for non-urgent symptoms. For details visit [email or web address].   Also download the MyChart app! Go to the app store, search "MyChart", open the app, select [facility], and log in with your MyChart username and password.  Due to Covid, a mask is required upon entering the hospital/clinic. If you do not have a mask, one will be given to you upon arrival. For doctor visits, patients may have 1 support person aged 66 or older with them. For treatment visits, patients cannot have anyone with them due to current Covid guidelines and our immunocompromised population.

## 2021-02-21 DIAGNOSIS — Z992 Dependence on renal dialysis: Secondary | ICD-10-CM | POA: Diagnosis not present

## 2021-02-21 DIAGNOSIS — N2581 Secondary hyperparathyroidism of renal origin: Secondary | ICD-10-CM | POA: Diagnosis not present

## 2021-02-21 DIAGNOSIS — N186 End stage renal disease: Secondary | ICD-10-CM | POA: Diagnosis not present

## 2021-02-21 LAB — KAPPA/LAMBDA LIGHT CHAINS
Kappa free light chain: 157.7 mg/L — ABNORMAL HIGH (ref 3.3–19.4)
Kappa, lambda light chain ratio: 0.47 (ref 0.26–1.65)
Lambda free light chains: 338.7 mg/L — ABNORMAL HIGH (ref 5.7–26.3)

## 2021-02-24 DIAGNOSIS — N186 End stage renal disease: Secondary | ICD-10-CM | POA: Diagnosis not present

## 2021-02-24 DIAGNOSIS — Z992 Dependence on renal dialysis: Secondary | ICD-10-CM | POA: Diagnosis not present

## 2021-02-24 DIAGNOSIS — N2581 Secondary hyperparathyroidism of renal origin: Secondary | ICD-10-CM | POA: Diagnosis not present

## 2021-02-24 LAB — PROTEIN ELECTROPHORESIS, SERUM
A/G Ratio: 1.3 (ref 0.7–1.7)
Albumin ELP: 3.6 g/dL (ref 2.9–4.4)
Alpha-1-Globulin: 0.2 g/dL (ref 0.0–0.4)
Alpha-2-Globulin: 0.8 g/dL (ref 0.4–1.0)
Beta Globulin: 1 g/dL (ref 0.7–1.3)
Gamma Globulin: 0.7 g/dL (ref 0.4–1.8)
Globulin, Total: 2.7 g/dL (ref 2.2–3.9)
Total Protein ELP: 6.3 g/dL (ref 6.0–8.5)

## 2021-02-24 LAB — IMMUNOFIXATION ELECTROPHORESIS
IgA: 259 mg/dL (ref 64–422)
IgG (Immunoglobin G), Serum: 815 mg/dL (ref 586–1602)
IgM (Immunoglobulin M), Srm: 12 mg/dL — ABNORMAL LOW (ref 26–217)
Total Protein ELP: 6.4 g/dL (ref 6.0–8.5)

## 2021-02-26 DIAGNOSIS — E119 Type 2 diabetes mellitus without complications: Secondary | ICD-10-CM | POA: Diagnosis not present

## 2021-02-26 DIAGNOSIS — Z992 Dependence on renal dialysis: Secondary | ICD-10-CM | POA: Diagnosis not present

## 2021-02-26 DIAGNOSIS — N186 End stage renal disease: Secondary | ICD-10-CM | POA: Diagnosis not present

## 2021-02-26 DIAGNOSIS — N2581 Secondary hyperparathyroidism of renal origin: Secondary | ICD-10-CM | POA: Diagnosis not present

## 2021-02-28 DIAGNOSIS — N186 End stage renal disease: Secondary | ICD-10-CM | POA: Diagnosis not present

## 2021-02-28 DIAGNOSIS — N2581 Secondary hyperparathyroidism of renal origin: Secondary | ICD-10-CM | POA: Diagnosis not present

## 2021-02-28 DIAGNOSIS — Z992 Dependence on renal dialysis: Secondary | ICD-10-CM | POA: Diagnosis not present

## 2021-03-03 DIAGNOSIS — N2581 Secondary hyperparathyroidism of renal origin: Secondary | ICD-10-CM | POA: Diagnosis not present

## 2021-03-03 DIAGNOSIS — Z992 Dependence on renal dialysis: Secondary | ICD-10-CM | POA: Diagnosis not present

## 2021-03-03 DIAGNOSIS — N186 End stage renal disease: Secondary | ICD-10-CM | POA: Diagnosis not present

## 2021-03-05 DIAGNOSIS — N2581 Secondary hyperparathyroidism of renal origin: Secondary | ICD-10-CM | POA: Diagnosis not present

## 2021-03-05 DIAGNOSIS — Z992 Dependence on renal dialysis: Secondary | ICD-10-CM | POA: Diagnosis not present

## 2021-03-05 DIAGNOSIS — N186 End stage renal disease: Secondary | ICD-10-CM | POA: Diagnosis not present

## 2021-03-05 NOTE — Progress Notes (Signed)
[facility] [place], [place] [address]   CLINIC:  Medical Oncology/Hematology  PCP:  Gerlene Fee, NP [address] [place] [address] [phone]   REASON FOR VISIT:  Follow-up for plasma cell myeloma and anemia  PRIOR THERAPY: none  NGS Results: not done  CURRENT THERAPY: Velcade & Decadron 3/4 weeks  BRIEF ONCOLOGIC HISTORY:  Oncology History  Stage 1 infiltrating ductal carcinoma of right female breast ([place])  09/12/2014 Mammogram   Mass in upper outer R breast, middle third depth appears slightly larger than it was on prior exam with more irreg spiculated margins   10/02/2014 Pathology Results   biopsy with invasive ductal carcinoma high grade 1.1 cm, dcis solid type. additional R breast tissue, excision, invasive ductal high grade 0.9 cm    10/24/2014 Pathology Results   no residual invasive carcinoma, DCIS, focal, atypical ductal hyperplasia, 0/7 LN positive for metastatic carcinoma ER > 90%, PR 30%, HER 2 2+   10/24/2014 Cancer Staging   T1cN0M0   10/24/2014 Surgery   R mastectomy, T1c, N0   12/28/2014 - 11/22/2015 Chemotherapy   Taxol/herceptin weekly X 12, Herceptin every 21 days, last due in September 2017   03/11/2015 -  Anti-estrogen oral therapy   Arimidex 1 mg daily   09/12/2015 Imaging   MUGA- The left ventricular ejection fraction equals 68%.   06/08/2016 Treatment Plan Change   Started Nerlynx   Multiple myeloma ([place])  10/04/2018 Initial Diagnosis   Multiple myeloma ([place])   10/11/2018 -  Chemotherapy   Patient is on Treatment Plan : MYELOMA NON-TRANSPLANT CANDIDATES VRd weekly d21d        CANCER STAGING:  Cancer Staging  Stage 1 infiltrating ductal carcinoma of right female breast [facility]) Staging form: Breast, AJCC 7th Edition - Clinical stage from 08/30/2015: Stage IA (T1c, N0, M0) - Signed by Baird Cancer, PA-C on 08/30/2015   INTERVAL HISTORY:  Ms. Aara Jacquot Bunt, a 85 y.o. female, returns for routine  follow-up and consideration for next cycle of chemotherapy. Leonarda was last seen on 01/09/2021.  Due for cycle #32 of Velcade & Decadron today.   Overall, she tells me she has been feeling pretty well. She denies n/v/d and tingling/numbness. She reports soreness at her port site. She denies fevers.   Overall, she feels ready for next cycle of chemo today.   REVIEW OF SYSTEMS:  Review of Systems  Constitutional:  Positive for fatigue (25%). Negative for appetite change (75%) and fever.  Cardiovascular:  Positive for chest pain (sore @ port site).  Gastrointestinal:  Negative for diarrhea, nausea and vomiting.  Neurological:  Negative for numbness.  All other systems reviewed and are negative.  PAST MEDICAL/SURGICAL HISTORY:  Past Medical History:  Diagnosis Date   Anemia     chronic macrocytic anemia   Anxiety    Chronic kidney disease    Chronic renal disease, stage 4, severely decreased glomerular filtration rate (GFR) between 15-29 mL/min/1.73 square meter ([place]) 38/25/0539   Complication of anesthesia    delirious after Breast Surgery   Dementia ([place])    mild   Depression    Diabetes mellitus with ESRD (end-stage renal disease) ([place])    type II   Dysphagia    Dyspnea    with activity   GERD (gastroesophageal reflux disease)    Glaucoma    Hyperlipidemia    Hypertension    Pneumonia    Stage 1 infiltrating ductal carcinoma of  right female breast ([facility]) 08/21/2015   ER+ PR+ HER 2 neu + (3+) T1cN0    Past Surgical History:  Procedure Laterality Date   A/V FISTULAGRAM Right 07/05/2020   Procedure: A/V Fistulagram;  Surgeon: Elam Dutch, MD;  Location: [facility];  Service: Cardiovascular;  Laterality: Right;   AV FISTULA PLACEMENT Left 11/22/2017   Procedure: ARTERIOVENOUS (AV) FISTULA CREATION LEFT ARM;  Surgeon: Elam Dutch, MD;  Location: [facility] OR;  Service: Vascular;  Laterality: Left;   AV FISTULA PLACEMENT Right 04/04/2020   Procedure: RIGHT ARM  ARTERIOVENOUS FISTULA CREATION;  Surgeon: Rosetta Posner, MD;  [facility];  Service: Vascular;  Laterality: Right;   AV FISTULA PLACEMENT Right 08/20/2020   Procedure: ARTERIOVENOUS (AV) FISTULA LIGATION RIGHT ARM;  Surgeon: Elam Dutch, MD;  Location: [facility] OR;  Service: Vascular;  Laterality: Right;   BIOPSY  08/07/2016   Procedure: BIOPSY;  Surgeon: Daneil Dolin, MD;  [facility];  Service: Endoscopy;;  gastric ulcer biopsy   COLONOSCOPY     ESOPHAGOGASTRODUODENOSCOPY N/A 08/07/2016   LA Grade A esophagitis s/p dilation, small hiatal hernia, multiple gastric ulcers and erosions, duodenal erosions s/p biopsy. Negative H.pylori    ESOPHAGOGASTRODUODENOSCOPY N/A 11/27/2016   normal esophagus, previously noted gastric ulcers completely healed, normal duodenum.    ESOPHAGOGASTRODUODENOSCOPY (EGD) WITH PROPOFOL N/A 07/23/2020   Procedure: ESOPHAGOGASTRODUODENOSCOPY (EGD) WITH PROPOFOL;  Surgeon: Rogene Houston, MD;  [facility];  Service: Endoscopy;  Laterality: N/A;   FISTULA SUPERFICIALIZATION Left 02/14/2018   Procedure: FISTULA SUPERFICIALIZATION LEFT ARM;  Surgeon: Angelia Mould, MD;  Location[facility];  Service: Vascular;  Laterality: Left;   FRACTURE SURGERY Right    ankle   HOT HEMOSTASIS  07/23/2020   Procedure: HOT HEMOSTASIS (ARGON PLASMA COAGULATION/BICAP);  Surgeon: Rogene Houston, MD;  [facility];  Service: Endoscopy;;   INTRAMEDULLARY (IM) NAIL INTERTROCHANTERIC Right 07/12/2020   Procedure: INTRAMEDULLARY (IM) NAIL INTERTROCHANTRIC;  Surgeon: Mordecai Rasmussen, MD;  [facility];  Service: Orthopedics;  Laterality: Right;   MALONEY DILATION N/A 08/07/2016   Procedure: Venia Minks DILATION;  Surgeon: Daneil Dolin, MD;  [facility];  Service: Endoscopy;  Laterality: N/A;   MASTECTOMY, PARTIAL Right    multiple myeloma     PERIPHERAL VASCULAR BALLOON ANGIOPLASTY Left 07/13/2019   Procedure: PERIPHERAL VASCULAR BALLOON  ANGIOPLASTY;  Surgeon: Marty Heck, MD;  Location: [facility];  Service: Cardiovascular;  Laterality: Left;  arm fistulogram   PERIPHERAL VASCULAR BALLOON ANGIOPLASTY Right 05/22/2020   Procedure: PERIPHERAL VASCULAR BALLOON ANGIOPLASTY;  Surgeon: Cherre Robins, MD;  Location: [facility];  Service: Cardiovascular;  Laterality: Right;  arm fistula   PERIPHERAL VASCULAR BALLOON ANGIOPLASTY Right 07/05/2020   Procedure: PERIPHERAL VASCULAR BALLOON ANGIOPLASTY;  Surgeon: Elam Dutch, MD;  Location: [facility];  Service: Cardiovascular;  Laterality: Right;  arm fistula   PORT-A-CATH REMOVAL Left 11/22/2017   Procedure: REMOVAL PORT-A-CATH LEFT CHEST;  Surgeon: Elam Dutch, MD;  Location: [facility];  Service: Vascular;  Laterality: Left;   RETINAL DETACHMENT SURGERY Right    SCLEROTHERAPY  07/23/2020   Procedure: SCLEROTHERAPY;  Surgeon: Rogene Houston, MD;  [facility];  Service: Endoscopy;;   UPPER EXTREMITY VENOGRAPHY N/A 08/22/2020   Procedure: LEFT UPPER & CENTRAL VENOGRAPHY;  Surgeon: Marty Heck, MD;  Location: [facility];  Service: Cardiovascular;  Laterality: N/A;  SOCIAL HISTORY:  Social History   Socioeconomic History   Marital status: Single    Spouse name: Not on file   Number of children: Not on file   Years of education: Not on file   Highest education level: Not on file  Occupational History   Occupation: retired   Tobacco Use   Smoking status: Never   Smokeless tobacco: Never  Vaping Use   Vaping Use: Never used  Substance and Sexual Activity   Alcohol use: No    Alcohol/week: 0.0 standard drinks   Drug use: No   Sexual activity: Never  Other Topics Concern   Not on file  Social History Narrative   Long term resident of SNF    Social Determinants of Health   Financial Resource Strain: Not on file  Food Insecurity: Not on file  Transportation Needs: Not on file  Physical Activity: Not on  file  Stress: Not on file  Social Connections: Not on file  Intimate Partner Violence: Not on file    FAMILY HISTORY:  Family History  Problem Relation Age of Onset   Multiple myeloma Sister    Brain cancer Sister    Dementia Mother        died at 37   Stroke Mother    Heart failure Mother    Diabetes Mother    Heart disease Father    Prostate cancer Brother    Colon cancer Neg Hx     CURRENT MEDICATIONS:  Current Outpatient Medications  Medication Sig Dispense Refill   anastrozole (ARIMIDEX) 1 MG tablet TAKE 1 TABLET BY MOUTH DAILY 30 tablet 6   No current facility-administered medications for this visit.    ALLERGIES:  Allergies  Allergen Reactions   Ace Inhibitors Other (See Comments) and Cough    Tongue swell , ie angioedema   Angiotensin Receptor Blockers     Angioedema with ACE-I   Penicillins Other (See Comments)    Unsure of reaction Has patient had a PCN reaction causing immediate rash, facial/tongue/throat swelling, SOB or lightheadedness with hypotension: Unknown Has patient had a PCN reaction causing severe rash involving mucus membranes or skin necrosis: Unknown Has patient had a PCN reaction that required hospitalization: No Has patient had a PCN reaction occurring within the last 10 years: Unknown If all of the above answers are "NO", then may proceed with Cephalosporin use.     Penicillin G     PHYSICAL EXAM:  Performance status (ECOG): 2 - Symptomatic, <50% confined to bed  Vitals:   03/06/21 1203  BP: 108/65  Pulse: 67  Resp: 18  Temp: 97.8 F (36.6 C)  SpO2: 97%   Wt Readings from Last 3 Encounters:  03/06/21 139 lb (63 kg)  03/06/21 139 lb 6.4 oz (63.2 kg)  02/20/21 130 lb 1.1 oz (59 kg)   Physical Exam Vitals reviewed.  Constitutional:      Appearance: Normal appearance.     Comments: In wheelchair  Cardiovascular:     Rate and Rhythm: Normal rate and regular rhythm.     Pulses: Normal pulses.     Heart sounds: Normal heart  sounds.  Pulmonary:     Effort: Pulmonary effort is normal.     Breath sounds: Normal breath sounds.  Neurological:     General: No focal deficit present.     Mental Status: She is alert and oriented to person, place, and time.  Psychiatric:        Mood and Affect: Mood  normal.        Behavior: Behavior normal.    LABORATORY DATA:  I have reviewed the labs as listed.  CBC Latest Ref Rng & Units 03/06/2021 02/20/2021 02/06/2021  WBC 4.0 - 10.5 K/uL 4.7 4.4 4.3  Hemoglobin 12.0 - 15.0 g/dL 12.3 12.0 11.8(L)  Hematocrit 36.0 - 46.0 % 38.2 38.7 37.1  Platelets 150 - 400 K/uL 221 212 230   CMP Latest Ref Rng & Units 03/06/2021 02/20/2021 02/06/2021  Glucose 70 - 99 mg/dL 205(H) 125(H) 202(H)  BUN 8 - 23 mg/dL 51(H) 39(H) 43(H)  Creatinine 0.44 - 1.00 mg/dL 7.06(H) 6.78(H) 6.08(H)  Sodium 135 - 145 mmol/L 133(L) 137 133(L)  Potassium 3.5 - 5.1 mmol/L 4.5 4.2 4.3  Chloride 98 - 111 mmol/L 95(L) 98 93(L)  CO2 22 - 32 mmol/L _0 Calcium 8.9 - 10.3 mg/dL 9.1 9.1 9.0  Total Protein 6.5 - 8.1 g/dL 6.9 6.8 6.6  Total Bilirubin 0.3 - 1.2 mg/dL 0.4 0.7 0.4  Alkaline Phos 38 - 126 U/L 98 96 102  AST 15 - 41 U/L _1 ALT 0 - 44 U/L _2 DIAGNOSTIC IMAGING:  I have independently reviewed the scans and discussed with the patient. No results found.   ASSESSMENT:  1.  IgA lambda plasma cell myeloma, stage II, standard risk: -BMBX on 09/20/2018 shows plasma cell myeloma, 30% plasma cells.  FISH panel was normal.  Chromosome analysis normal. -Labs at diagnosis on 08/29/2018 with M spike 0.9 g.  Kappa light chains 148, lambda light chains 1132, ratio 0.13.  LDH normal.  Beta-2 microglobulin 13.5.  She had transfusion dependent anemia. -Velcade and dexamethasone started on 10/11/2018. -Myeloma panel on 07/11/2019 shows SPEP is negative.  Free light chain ratio improved to 1.09.  Lambda light chains are 128, improved from 141.  Kappa light chains are 140. -She has been resident at [facility] since 10/12/2019.   2.  Stage I IDC of the right breast, ER/PR positive, HER-2 negative: -She is on anastrozole.   PLAN:  1.  IgA lambda plasma cell myeloma: - Reviewed myeloma panel from 02/20/2021 which showed M spike is not detectable. - Free kappa light chains are 157, lambda light chains 338, ratio 0.47.  Immunofixation was unremarkable IgA lambda monoclonal protein. - We will continue Velcade every [redacted] weeks along with low-dose dexamethasone on Velcade days. - Reviewed labs today which showed normal CBC and LFTs. - RTC 8 weeks with myeloma labs repeated 2 weeks prior.   2.  ESRD on HD: - Can continue HD Monday, Wednesday and Friday.   3.  Bone strengthening: - Bisphosphonates not started due to patient being on dialysis.   4.  Osteopenia: - Last bone density in 2017 showed osteopenia.  Consider repeating in the near future.   5.  Right breast cancer: - Continue anastrozole.  Tolerating well.   Orders placed this encounter:  No orders of the defined types were placed in this encounter.    Derek Jack, MD [place] [phone]   I, Thana Ates, am acting as a scribe for Dr. Derek Jack.  I, Derek Jack MD, have reviewed the above documentation for accuracy and completeness, and I agree with the above.

## 2021-03-06 ENCOUNTER — Inpatient Hospital Stay (HOSPITAL_BASED_OUTPATIENT_CLINIC_OR_DEPARTMENT_OTHER): Payer: Medicare PPO | Admitting: Hematology

## 2021-03-06 ENCOUNTER — Inpatient Hospital Stay (HOSPITAL_COMMUNITY): Payer: Medicare PPO

## 2021-03-06 ENCOUNTER — Non-Acute Institutional Stay (SKILLED_NURSING_FACILITY): Payer: Medicare PPO | Admitting: Adult Health

## 2021-03-06 ENCOUNTER — Encounter: Payer: Self-pay | Admitting: Adult Health

## 2021-03-06 VITALS — BP 108/65 | HR 67 | Temp 97.8°F | Resp 18 | Wt 139.0 lb

## 2021-03-06 DIAGNOSIS — C9000 Multiple myeloma not having achieved remission: Secondary | ICD-10-CM

## 2021-03-06 DIAGNOSIS — Z9011 Acquired absence of right breast and nipple: Secondary | ICD-10-CM | POA: Diagnosis not present

## 2021-03-06 DIAGNOSIS — R5383 Other fatigue: Secondary | ICD-10-CM | POA: Diagnosis not present

## 2021-03-06 DIAGNOSIS — E1122 Type 2 diabetes mellitus with diabetic chronic kidney disease: Secondary | ICD-10-CM

## 2021-03-06 DIAGNOSIS — E785 Hyperlipidemia, unspecified: Secondary | ICD-10-CM

## 2021-03-06 DIAGNOSIS — Z992 Dependence on renal dialysis: Secondary | ICD-10-CM

## 2021-03-06 DIAGNOSIS — E1169 Type 2 diabetes mellitus with other specified complication: Secondary | ICD-10-CM | POA: Diagnosis not present

## 2021-03-06 DIAGNOSIS — N186 End stage renal disease: Secondary | ICD-10-CM

## 2021-03-06 DIAGNOSIS — I700 Atherosclerosis of aorta: Secondary | ICD-10-CM

## 2021-03-06 DIAGNOSIS — C50411 Malignant neoplasm of upper-outer quadrant of right female breast: Secondary | ICD-10-CM | POA: Diagnosis not present

## 2021-03-06 DIAGNOSIS — Z170 Estrogen receptor positive status [ER+]: Secondary | ICD-10-CM | POA: Diagnosis not present

## 2021-03-06 DIAGNOSIS — Z5111 Encounter for antineoplastic chemotherapy: Secondary | ICD-10-CM | POA: Diagnosis not present

## 2021-03-06 DIAGNOSIS — Z79811 Long term (current) use of aromatase inhibitors: Secondary | ICD-10-CM | POA: Diagnosis not present

## 2021-03-06 DIAGNOSIS — N184 Chronic kidney disease, stage 4 (severe): Secondary | ICD-10-CM | POA: Diagnosis not present

## 2021-03-06 DIAGNOSIS — I70209 Unspecified atherosclerosis of native arteries of extremities, unspecified extremity: Secondary | ICD-10-CM

## 2021-03-06 DIAGNOSIS — I120 Hypertensive chronic kidney disease with stage 5 chronic kidney disease or end stage renal disease: Secondary | ICD-10-CM | POA: Diagnosis not present

## 2021-03-06 DIAGNOSIS — F01518 Vascular dementia, unspecified severity, with other behavioral disturbance: Secondary | ICD-10-CM | POA: Diagnosis not present

## 2021-03-06 LAB — COMPREHENSIVE METABOLIC PANEL
ALT: 13 U/L (ref 0–44)
AST: 17 U/L (ref 15–41)
Albumin: 3.8 g/dL (ref 3.5–5.0)
Alkaline Phosphatase: 98 U/L (ref 38–126)
Anion gap: 13 (ref 5–15)
BUN: 51 mg/dL — ABNORMAL HIGH (ref 8–23)
CO2: 25 mmol/L (ref 22–32)
Calcium: 9.1 mg/dL (ref 8.9–10.3)
Chloride: 95 mmol/L — ABNORMAL LOW (ref 98–111)
Creatinine, Ser: 7.06 mg/dL — ABNORMAL HIGH (ref 0.44–1.00)
GFR, Estimated: 5 mL/min — ABNORMAL LOW (ref >60)
Glucose, Bld: 205 mg/dL — ABNORMAL HIGH (ref 70–99)
Potassium: 4.5 mmol/L (ref 3.5–5.1)
Sodium: 133 mmol/L — ABNORMAL LOW (ref 135–145)
Total Bilirubin: 0.4 mg/dL (ref 0.3–1.2)
Total Protein: 6.9 g/dL (ref 6.5–8.1)

## 2021-03-06 LAB — CBC WITH DIFFERENTIAL/PLATELET
Abs Immature Granulocytes: 0.02 10*3/uL (ref 0.00–0.07)
Basophils Absolute: 0 10*3/uL (ref 0.0–0.1)
Basophils Relative: 1 %
Eosinophils Absolute: 0.2 10*3/uL (ref 0.0–0.5)
Eosinophils Relative: 3 %
HCT: 38.2 % (ref 36.0–46.0)
Hemoglobin: 12.3 g/dL (ref 12.0–15.0)
Immature Granulocytes: 0 %
Lymphocytes Relative: 20 %
Lymphs Abs: 0.9 10*3/uL (ref 0.7–4.0)
MCH: 32.7 pg (ref 26.0–34.0)
MCHC: 32.2 g/dL (ref 30.0–36.0)
MCV: 101.6 fL — ABNORMAL HIGH (ref 80.0–100.0)
Monocytes Absolute: 0.5 10*3/uL (ref 0.1–1.0)
Monocytes Relative: 11 %
Neutro Abs: 3 10*3/uL (ref 1.7–7.7)
Neutrophils Relative %: 65 %
Platelets: 221 10*3/uL (ref 150–400)
RBC: 3.76 MIL/uL — ABNORMAL LOW (ref 3.87–5.11)
RDW: 16.4 % — ABNORMAL HIGH (ref 11.5–15.5)
WBC: 4.7 10*3/uL (ref 4.0–10.5)
nRBC: 0 % (ref 0.0–0.2)

## 2021-03-06 LAB — MAGNESIUM: Magnesium: 2.3 mg/dL (ref 1.7–2.4)

## 2021-03-06 MED ORDER — BORTEZOMIB CHEMO SQ INJECTION 3.5 MG (2.5MG/ML)
1.3000 mg/m2 | Freq: Once | INTRAMUSCULAR | Status: AC
  Administered 2021-03-06: 13:00:00 2 mg via SUBCUTANEOUS
  Filled 2021-03-06: qty 0.8

## 2021-03-06 MED ORDER — PROCHLORPERAZINE MALEATE 10 MG PO TABS
10.0000 mg | ORAL_TABLET | Freq: Once | ORAL | Status: AC
  Administered 2021-03-06: 13:00:00 10 mg via ORAL
  Filled 2021-03-06: qty 1

## 2021-03-06 MED ORDER — DEXAMETHASONE 4 MG PO TABS
10.0000 mg | ORAL_TABLET | Freq: Once | ORAL | Status: AC
  Administered 2021-03-06: 13:00:00 10 mg via ORAL
  Filled 2021-03-06: qty 3

## 2021-03-06 NOTE — Progress Notes (Signed)
Location:  [facility] Room Number: 148 Place of Service:  SNF (31)   CODE STATUS: dnr  Allergies  Allergen Reactions   Ace Inhibitors Other (See Comments) and Cough    Tongue swell , ie angioedema   Angiotensin Receptor Blockers     Angioedema with ACE-I   Penicillins Other (See Comments)    Unsure of reaction Has patient had a PCN reaction causing immediate rash, facial/tongue/throat swelling, SOB or lightheadedness with hypotension: Unknown Has patient had a PCN reaction causing severe rash involving mucus membranes or skin necrosis: Unknown Has patient had a PCN reaction that required hospitalization: No Has patient had a PCN reaction occurring within the last 10 years: Unknown If all of the above answers are "NO", then may proceed with Cephalosporin use.     Penicillin G     Chief Complaint  Patient presents with   Medical Management of Chronic Issues             Hyperlipidemia associated with type 2 diabetes mellitus: Diabetes mellitus with end stage renal disease (ESRD)  Aortic atherosclerosis/atherosclerotic peripheral vascular disease:  Vascular dementia with behavioral disturbance:    HPI:  She is a 85 year old long term resident of this facility being seen for the management of her chronic illnesses:  Hyperlipidemia associated with type 2 diabetes mellitus: Diabetes mellitus with end stage renal disease (ESRD)  Aortic atherosclerosis/atherosclerotic peripheral vascular disease:  Vascular dementia with behavioral disturbance. There are no reports of uncontrolled pain; no changes in her appetite; weight is stable. No reports of anxiety or depressive thoughts.   Past Medical History:  Diagnosis Date   Anemia     chronic macrocytic anemia   Anxiety    Chronic kidney disease    Chronic renal disease, stage 4, severely decreased glomerular filtration rate (GFR) between 15-29 mL/min/1.73 square meter ([place]) 17/40/8144   Complication of anesthesia     delirious after Breast Surgery   Dementia ([place])    mild   Depression    Diabetes mellitus with ESRD (end-stage renal disease) ([place])    type II   Dysphagia    Dyspnea    with activity   GERD (gastroesophageal reflux disease)    Glaucoma    Hyperlipidemia    Hypertension    Pneumonia    Stage 1 infiltrating ductal carcinoma of right female breast ([place]) 08/21/2015   ER+ PR+ HER 2 neu + (3+) T1cN0     Past Surgical History:  Procedure Laterality Date   A/V FISTULAGRAM Right 07/05/2020   Procedure: A/V Fistulagram;  Surgeon: Elam Dutch, MD;  Location: [facility];  Service: Cardiovascular;  Laterality: Right;   AV FISTULA PLACEMENT Left 11/22/2017   Procedure: ARTERIOVENOUS (AV) FISTULA CREATION LEFT ARM;  Surgeon: Elam Dutch, MD;  Location: [facility] OR;  Service: Vascular;  Laterality: Left;   AV FISTULA PLACEMENT Right 04/04/2020   Procedure: RIGHT ARM ARTERIOVENOUS FISTULA CREATION;  Surgeon: Rosetta Posner, MD;  Location: [facility];  Service: Vascular;  Laterality: Right;   AV FISTULA PLACEMENT Right 08/20/2020   Procedure: ARTERIOVENOUS (AV) FISTULA LIGATION RIGHT ARM;  Surgeon: Elam Dutch, MD;  Location: [facility];  Service: Vascular;  Laterality: Right;   BIOPSY  08/07/2016   Procedure: BIOPSY;  Surgeon: Daneil Dolin, MD;  [facility];  Service: Endoscopy;;  gastric ulcer biopsy   COLONOSCOPY     ESOPHAGOGASTRODUODENOSCOPY N/A 08/07/2016   LA Grade A esophagitis  s/p dilation, small hiatal hernia, multiple gastric ulcers and erosions, duodenal erosions s/p biopsy. Negative H.pylori    ESOPHAGOGASTRODUODENOSCOPY N/A 11/27/2016   normal esophagus, previously noted gastric ulcers completely healed, normal duodenum.    ESOPHAGOGASTRODUODENOSCOPY (EGD) WITH PROPOFOL N/A 07/23/2020   Procedure: ESOPHAGOGASTRODUODENOSCOPY (EGD) WITH PROPOFOL;  Surgeon: Rogene Houston, MD;  [facility];  Service: Endoscopy;  Laterality: N/A;   FISTULA  SUPERFICIALIZATION Left 02/14/2018   Procedure: FISTULA SUPERFICIALIZATION LEFT ARM;  Surgeon: Angelia Mould, MD;  Location: [facility] OR;  Service: Vascular;  Laterality: Left;   FRACTURE SURGERY Right    ankle   HOT HEMOSTASIS  07/23/2020   Procedure: HOT HEMOSTASIS (ARGON PLASMA COAGULATION/BICAP);  Surgeon: Rogene Houston, MD;  [facility];  Service: Endoscopy;;   INTRAMEDULLARY (IM) NAIL INTERTROCHANTERIC Right 07/12/2020   Procedure: INTRAMEDULLARY (IM) NAIL INTERTROCHANTRIC;  Surgeon: Mordecai Rasmussen, MD;  [facility];  Service: Orthopedics;  Laterality: Right;   MALONEY DILATION N/A 08/07/2016   Procedure: Venia Minks DILATION;  Surgeon: Daneil Dolin, MD;  [facility];  Service: Endoscopy;  Laterality: N/A;   MASTECTOMY, PARTIAL Right    multiple myeloma     PERIPHERAL VASCULAR BALLOON ANGIOPLASTY Left 07/13/2019   Procedure: PERIPHERAL VASCULAR BALLOON ANGIOPLASTY;  Surgeon: Marty Heck, MD;  Location: [facility];  Service: Cardiovascular;  Laterality: Left;  arm fistulogram   PERIPHERAL VASCULAR BALLOON ANGIOPLASTY Right 05/22/2020   Procedure: PERIPHERAL VASCULAR BALLOON ANGIOPLASTY;  Surgeon: Cherre Robins, MD;  Location: [facility];  Service: Cardiovascular;  Laterality: Right;  arm fistula   PERIPHERAL VASCULAR BALLOON ANGIOPLASTY Right 07/05/2020   Procedure: PERIPHERAL VASCULAR BALLOON ANGIOPLASTY;  Surgeon: Elam Dutch, MD;  Location: [facility];  Service: Cardiovascular;  Laterality: Right;  arm fistula   PORT-A-CATH REMOVAL Left 11/22/2017   Procedure: REMOVAL PORT-A-CATH LEFT CHEST;  Surgeon: Elam Dutch, MD;  Location: [facility];  Service: Vascular;  Laterality: Left;   RETINAL DETACHMENT SURGERY Right    SCLEROTHERAPY  07/23/2020   Procedure: SCLEROTHERAPY;  Surgeon: Rogene Houston, MD;  [facility];  Service: Endoscopy;;   UPPER EXTREMITY VENOGRAPHY N/A 08/22/2020   Procedure: LEFT UPPER &  CENTRAL VENOGRAPHY;  Surgeon: Marty Heck, MD;  Location: [facility] [facility];  Service: Cardiovascular;  Laterality: N/A;    Social History   Socioeconomic History   Marital status: Single    Spouse name: Not on file   Number of children: Not on file   Years of education: Not on file   Highest education level: Not on file  Occupational History   Occupation: retired   Tobacco Use   Smoking status: Never   Smokeless tobacco: Never  Vaping Use   Vaping Use: Never used  Substance and Sexual Activity   Alcohol use: No    Alcohol/week: 0.0 standard drinks   Drug use: No   Sexual activity: Never  Other Topics Concern   Not on file  Social History Narrative   Long term resident of SNF    Social Determinants of Health   Financial Resource Strain: Not on file  Food Insecurity: Not on file  Transportation Needs: Not on file  Physical Activity: Not on file  Stress: Not on file  Social Connections: Not on file  Intimate Partner Violence: Not on file   Family History  Problem Relation Age of Onset   Multiple myeloma Sister    Brain cancer  Sister    Dementia Mother        died at 65   Stroke Mother    Heart failure Mother    Diabetes Mother    Heart disease Father    Prostate cancer Brother    Colon cancer Neg Hx       VITAL SIGNS BP (!) 104/57    Pulse 60    Temp 98.2 F (36.8 C)    Resp 20    Ht '5\' 3"'  (1.6 m)    Wt 139 lb 6.4 oz (63.2 kg)    BMI 24.69 kg/m   Outpatient Encounter Medications as of 03/06/2021  Medication Sig   acetaminophen (TYLENOL) 650 MG CR tablet Take 650 mg by mouth every 8 (eight) hours.   acyclovir (ZOVIRAX) 200 MG capsule Take 200 mg by mouth in the morning. (0800)   Amino Acids-Protein Hydrolys (FEEDING SUPPLEMENT, PRO-STAT SUGAR FREE 64,) LIQD Take 30 mLs by mouth 3 (three) times daily with meals. (0800, 1200 & 1800)   anastrozole (ARIMIDEX) 1 MG tablet TAKE 1 TABLET BY MOUTH DAILY   atenolol (TENORMIN) 25 MG tablet Take 1 tablet  (25 mg total) by mouth in the morning. (0800)   atorvastatin (LIPITOR) 10 MG tablet Take 10 mg by mouth at bedtime. (2000)   Balsam [place]-Castor Oil (VENELEX) OINT Apply topically. Apply to bilateral buttocks and sacrum every shift.   Calcium Acetate 667 MG TABS Take 667-1,334 mg by mouth See admin instructions. Take 2 tablets (1334 mg) by mouth with each meal (0800 &1730) and take 1 tablet (667 mg) by mouth with each snack (2000)   calcium-vitamin D (OSCAL WITH D) 500-200 MG-UNIT tablet Take 1 tablet by mouth in the morning. (0800)   cloNIDine (CATAPRES) 0.1 MG tablet Take 1 tablet (0.1 mg total) by mouth at bedtime. (2000) Hold for SBP < 110 bpm   insulin glargine (LANTUS SOLOSTAR) 100 UNIT/ML Solostar Pen Inject 10 Units into the skin at bedtime. (2000)   melatonin 3 MG TABS tablet Take 6 mg by mouth at bedtime. (2000) For Sleep   multivitamin (RENA-VIT) TABS tablet Take 1 tablet by mouth at bedtime. (2100)   NON FORMULARY Diet-Regular Diet with thin liquids   ondansetron (ZOFRAN) 4 MG tablet Take 1 tablet (4 mg total) by mouth every 6 (six) hours as needed for nausea. (Patient not taking: Reported on 01/09/2021)   pantoprazole (PROTONIX) 40 MG tablet Take 40 mg by mouth 2 (two) times daily.   sucralfate (CARAFATE) 1 GM/10ML suspension Take 1 g by mouth in the morning, at noon, in the evening, and at bedtime. (0800, 1200, 1700 & 2100)   No facility-administered encounter medications on file as of 03/06/2021.     SIGNIFICANT DIAGNOSTIC EXAMS   PREVIOUS   07-11-20: chest x-ray:  Central catheter tip at cavoatrial junction. No pneumothorax. Mild left base atelectasis. No edema or airspace opacity. Stable cardiac silhouette. Aortic Atherosclerosis   07-11-20: right knee x-ray:  No acute fracture or dislocation is noted. Diffuse vascular calcifications are seen. No soft tissue abnormality is noted.  07-11-20: right hip x-ray:  Acute, impacted, angulated right basicervical femoral neck fracture.    07-23-20: upper endoscopy:  Normal hypopharynx. - Normal esophagus. - Z-line irregular, 34 cm from the incisors. - Non-bleeding gastric ulcers with no stigmata of bleeding. - Duodenal ulcer with stigmata of bleed/ pigmented material. Gold probe therapy was not successful and hemostasis achieved with injection of dilute epinephrine. - Non-bleeding duodenal ulcer with no stigmata  of bleeding(second bulbar ulcer) - Normal second portion of the duodenum. - No specimens collected.  07-23-20: chest x-ray:No active disease. Aortic Atherosclerosis    NO NEW EXAMS.     LABS REVIEWED PREVIOUS  03-26-20: wbc 6.3; hgb 9.4; hct 30.4; mcv 104.1; plt 300; glucose 223; bun 101; creat 14.22; k+ 5.8; na++ 135; ca 8.1 GFR 2; liver normal albumin 3.4  05-21-20; wbc 5.1; hgb 10.4; hct 32.8; mcv 106.8 plt 303; glucose 186; bun 41; creat 1.54; k+ 5.0; na++ 132; ca 8.4 GFR 5; liver normal albumin 3.7 06-13-20: chol 189; ldl 106; trig 211; hdl 41; hgb a1c 6.2   07-11-20: wbc 9.1; hgb 10.6; hct 34.6; mcv 109.1 plt 248; glucose 79; bun 45; creat 7.71 k+ 4.9; na++ 134; ca 8.1 GFR 5; liver normal albumin 3.5 mag 2.4 07-13-20: wbc 11.4; hgb 8.9; hct 29.7; mcv 108.8 plt 303; glucose 178; bun 66; creat 11.17; k+ 6.6; na++ 135; ca 8.2; GFR 3 07-15-20: wbc 11.0; hgb 7.5; hct 25.2; mcv 108.2 plt 251; glucose 117; bun 60; creat 9.41; k+ 5.5; na++ 138; ca 8.4 GFR 4 07-17-20: wbc 11.5; hgb 9.4; hct 29.8; mcv 100.3 plt 300; glucose 140; bun 68; creat 8.04; k+ 4.2; na++ 135; ca 8.5 GFR 5 07-22-20: wbc 11.6; hgb 7.1; hct 23.5; mcv 104.0 plt 444; glucose 214; bun 97; creat 9.18; k+ 4.5; na++ 140; ca 8.0; GFR4 07-24-20: wbc 14.3; hgb 6.7; hct 21.2; mcv 99.5 plt 385; glucose 96; bun 49; creat 6.55; k+ 4.7; na++ 133; ca 8.0; GFR4; mag 2.0 phos 3.8 07-26-20: wbc 10.4; hgb 8.5; hct 26.8; mcv 98.9 plt 430; glucose 111; bun 40;creat 6.90; k+ 4.2; na++ 133; ca 8.5; GFR 6; mag 2.2; phos 3.3    07-30-20: wbc 7.6; hgb 7.3; hct 24.1; mcv 103.4 plt 345;  glucose 97; bun 26; creat 5.89; k+ 4.1; na++ 139; ca 8.6; GFR 7; liver normal albumin 2.8.  09-03-20: wbc 5.8; hgb 11.2; hct 37.3; mcv 102.8 plt 274; glucose 175; bun 38; creat 5.72; k+ 3.1; na++ 138; ca 9.2 GFR 7 alk phos 138; albumin 3.4 mag 2.0 09-30-20: wbc 5.6; hgb 13.1; hct 42.6; mcv 97.9 plt 260; glucose 92; bun 23; creat 4.18; k+ 3.1; na++ 134; ca 8.7; GFR 10; liver normal albumin 3.8 10-29-20: wbc 5.3; hgb 12.1; hct 40.0; mcv 96.6 plt 218; glucose 190; bun 54; creat 7.35 k+ 4.0; na++ 134; ca 9.4; GFR 5 liver normal albumin 3.7; mag 2.3 11-19-20: wbc 10.9; hgb 11.3; hct 37.2; mcv 96.6 plt 242; glucose 106; bun 47; creat 6.58; k+ 3.9; na++ 133; ca 9.0; GFR 6 liver normal albumin 3.5  12-02-20: hgb a1c 5.6; chol 167; ldl 83; trig 229; hdl 38 12-03-20; wbc 5.2; hgb 11.1; hct 35.7; mcv 96.5 plt 320; glucose 139; bun 47; creat 6.05; k+ 4.1; na++ 134; ca 9.1; GFR 6; liver normal albumin 3.8 mag 2.4  12-17-20: wbc 5.2; hgb 10.5; hct 33.8; mcv 98.8 plt 256; glucose 136; bun 43; creat 6.19; k+ 3.6; na++ 137; ca 8.9; GFR 6 liver normal albumin 3.9   TODAY  03-06-21: wbc 4.7; hgb 12.3; hct 38.2; mcv 101.6 plt 221; glucose 205; bun 51; creat 7.06; k+ 4.5; na++ 133; ca 9.1; GFR 5; liver normal albumin 3.8   Review of Systems  Constitutional:  Negative for malaise/fatigue.  Respiratory:  Negative for cough and shortness of breath.   Cardiovascular:  Negative for chest pain, palpitations and leg swelling.  Gastrointestinal:  Negative for abdominal pain, constipation and  heartburn.  Musculoskeletal:  Negative for back pain, joint pain and myalgias.  Skin: Negative.   Neurological:  Negative for dizziness.  Psychiatric/Behavioral:  The patient is not nervous/anxious.     Physical Exam Constitutional:      General: She is not in acute distress.    Appearance: She is well-developed. She is not diaphoretic.  Neck:     Thyroid: No thyromegaly.  Cardiovascular:     Rate and Rhythm: Normal rate and regular  rhythm.     Pulses: Normal pulses.     Heart sounds: Normal heart sounds.  Pulmonary:     Effort: Pulmonary effort is normal. No respiratory distress.     Breath sounds: Normal breath sounds.  Chest:  Breasts:    Right: Absent.  Abdominal:     General: Bowel sounds are normal. There is no distension.     Palpations: Abdomen is soft.     Tenderness: There is no abdominal tenderness.  Musculoskeletal:     Cervical back: Neck supple.     Right lower leg: No edema.     Left lower leg: No edema.     Comments:  Right hip IM nail 07-12-20    Is able to move all extremities    Lymphadenopathy:     Cervical: No cervical adenopathy.  Skin:    General: Skin is warm and dry.     Comments: Chest wall dialysis access  Neurological:     Mental Status: She is alert. Mental status is at baseline.  Psychiatric:        Mood and Affect: Mood normal.      ASSESSMENT/ PLAN:  TODAY  Hyperlipidemia associated with type 2 diabetes mellitus: is stable LDL 83 will continue lipitor 10 mg daily   2. Diabetes mellitus with end stage renal disease (ESRD) is stable hgb a1c 5.8; will continue lantus 8 units nightly   3. Aortic atherosclerosis/atherosclerotic peripheral vascular disease: is stable (ct7-28-20)  4. Vascular dementia with behavioral disturbance: is without change weight is 139 pounds will monitor   PREVIOUS   5. Anemia due to end stage renal disease: is stable hgb 10.5 will continue to monitor her status   6. Stage 1 infiltrating ductal carcinoma of female right breast/status post mastectomy on right. Will continue arimidex 1 mg daily   7. Multiple myeloma not having achieved remission: is followed by oncology  8. Herpes: is without outbreak: will continue acyclovir 200 mg daily   9. Major depression chronic recurrent is stable will continue zoloft 100 mg daily   10. Protein calorie malnutrition, severe: is stable albumin is 3.9 will continue supplement as directed and will continue  prostat 30 mL three times daily   11. Multiple gastric ulcers: duodenal ulcer: upper GI bleed: esophageal reflux disease without esophagitis: is stable will continue protonix 40 mg twice daily carafate 1 gm four times daily   12. Closed right hip fracture sequela: is stable is status right hip nail 07-12-20: will continue tylenol 605 mg three times daily   13. Chronic kidney disease with end stage renal disease on dialysis due to type 2 diabetes mellitus/dependence on dialysis: is stable is on hemodialysis three days weekly 1200 cc fluid restriction; will continue calcium acetate: 1334 mg with breakfast and supper 667 mg with lunch and 667 mg with snacks  14. Hypertension due to end stage renal disease (ESRD) due to type 2 diabetes mellitus is stable b/p 104/57 norvasc 10 mg daily tenormin 25 mg daily clonidine 0.1  mg daily       Ok Edwards NP [facility]   [phone]

## 2021-03-06 NOTE — Patient Instructions (Signed)
[place] at [facility] Discharge Instructions   You were seen and examined today by Dr. Delton Coombes. He reviewed your labs which were stable. You will receive Velcade injection today and every 2 weeks.  Return as scheduled for lab work and injections.    Thank you for choosing [place] at [facility] to provide your oncology and hematology care.  To afford each patient quality time with our provider, please arrive at least 15 minutes before your scheduled appointment time.   If you have a lab appointment with the [facility] please come in thru the Main Entrance and check in at the main information desk.  You need to re-schedule your appointment should you arrive 10 or more minutes late.  We strive to give you quality time with our providers, and arriving late affects you and other patients whose appointments are after yours.  Also, if you no show three or more times for appointments you may be dismissed from the clinic at the providers discretion.     Again, thank you for choosing [facility].  Our hope is that these requests will decrease the amount of time that you wait before being seen by our physicians.       _____________________________________________________________  Should you have questions after your visit to [facility], please contact our office at [phone] and follow the prompts.  Our office hours are 8:00 a.m. and 4:30 p.m. Monday - Friday.  Please note that voicemails left after 4:00 p.m. may not be returned until the following business day.  We are closed weekends and major holidays.  You do have access to a nurse 24-7, just call the main number to the clinic [phone] and do not press any options, hold on the line and a nurse will answer the phone.    For prescription refill requests, have your pharmacy contact our office and allow 72 hours.    Due to Covid, you will need to wear a mask  upon entering the hospital. If you do not have a mask, a mask will be given to you at the Main Entrance upon arrival. For doctor visits, patients may have 1 support person age 21 or older with them. For treatment visits, patients can not have anyone with them due to social distancing guidelines and our immunocompromised population.

## 2021-03-06 NOTE — Progress Notes (Signed)
Patient tolerated Velcade injection with no complaints voiced. Lab work reviewed. See MAR for details. Injection site clean and dry with no bruising or swelling noted. Patient stable during and after injection. Band aid applied. VSS. Patient left in satisfactory condition with no s/s of distress noted. 

## 2021-03-06 NOTE — Patient Instructions (Signed)
[place]  Discharge Instructions: Thank you for choosing [facility] to provide your oncology and hematology care.  If you have a lab appointment with the [facility], please come in thru the Main Entrance and check in at the main information desk.  Wear comfortable clothing and clothing appropriate for easy access to any Portacath or PICC line.   We strive to give you quality time with your provider. You may need to reschedule your appointment if you arrive late (15 or more minutes).  Arriving late affects you and other patients whose appointments are after yours.  Also, if you miss three or more appointments without notifying the office, you may be dismissed from the clinic at the providers discretion.      For prescription refill requests, have your pharmacy contact our office and allow 72 hours for refills to be completed.    Today you received the following chemotherapy and/or immunotherapy agents Velcade, return as scheduled.   To help prevent nausea and vomiting after your treatment, we encourage you to take your nausea medication as directed.  BELOW ARE SYMPTOMS THAT SHOULD BE REPORTED IMMEDIATELY: *FEVER GREATER THAN 100.4 F (38 C) OR HIGHER *CHILLS OR SWEATING *NAUSEA AND VOMITING THAT IS NOT CONTROLLED WITH YOUR NAUSEA MEDICATION *UNUSUAL SHORTNESS OF BREATH *UNUSUAL BRUISING OR BLEEDING *URINARY PROBLEMS (pain or burning when urinating, or frequent urination) *BOWEL PROBLEMS (unusual diarrhea, constipation, pain near the anus) TENDERNESS IN MOUTH AND THROAT WITH OR WITHOUT PRESENCE OF ULCERS (sore throat, sores in mouth, or a toothache) UNUSUAL RASH, SWELLING OR PAIN  UNUSUAL VAGINAL DISCHARGE OR ITCHING   Items with * indicate a potential emergency and should be followed up as soon as possible or go to the Emergency Department if any problems should occur.  Please show the CHEMOTHERAPY ALERT CARD or IMMUNOTHERAPY ALERT CARD at check-in to the  Emergency Department and triage nurse.  Should you have questions after your visit or need to cancel or reschedule your appointment, please contact [facility] [phone]  and follow the prompts.  Office hours are 8:00 a.m. to 4:30 p.m. Monday - Friday. Please note that voicemails left after 4:00 p.m. may not be returned until the following business day.  We are closed weekends and major holidays. You have access to a nurse at all times for urgent questions. Please call the main number to the clinic [phone] and follow the prompts.  For any non-urgent questions, you may also contact your provider using MyChart. We now offer e-Visits for anyone 68 and older to request care online for non-urgent symptoms. For details visit [email or web address].   Also download the MyChart app! Go to the app store, search "MyChart", open the app, select [place], and log in with your MyChart username and password.  Due to Covid, a mask is required upon entering the hospital/clinic. If you do not have a mask, one will be given to you upon arrival. For doctor visits, patients may have 1 support person aged 79 or older with them. For treatment visits, patients cannot have anyone with them due to current Covid guidelines and our immunocompromised population.

## 2021-03-06 NOTE — Progress Notes (Signed)
Patient has been examined by Dr. Katragadda, and vital signs and labs have been reviewed. ANC, Creatinine, LFTs, hemoglobin, and platelets are within treatment parameters per M.D. - pt may proceed with treatment.    °

## 2021-03-07 DIAGNOSIS — Z992 Dependence on renal dialysis: Secondary | ICD-10-CM | POA: Diagnosis not present

## 2021-03-07 DIAGNOSIS — N2581 Secondary hyperparathyroidism of renal origin: Secondary | ICD-10-CM | POA: Diagnosis not present

## 2021-03-07 DIAGNOSIS — N186 End stage renal disease: Secondary | ICD-10-CM | POA: Diagnosis not present

## 2021-03-08 DIAGNOSIS — N186 End stage renal disease: Secondary | ICD-10-CM | POA: Diagnosis not present

## 2021-03-08 DIAGNOSIS — Z992 Dependence on renal dialysis: Secondary | ICD-10-CM | POA: Diagnosis not present

## 2021-03-10 DIAGNOSIS — N2581 Secondary hyperparathyroidism of renal origin: Secondary | ICD-10-CM | POA: Diagnosis not present

## 2021-03-10 DIAGNOSIS — Z992 Dependence on renal dialysis: Secondary | ICD-10-CM | POA: Diagnosis not present

## 2021-03-10 DIAGNOSIS — N186 End stage renal disease: Secondary | ICD-10-CM | POA: Diagnosis not present

## 2021-03-12 DIAGNOSIS — N2581 Secondary hyperparathyroidism of renal origin: Secondary | ICD-10-CM | POA: Diagnosis not present

## 2021-03-12 DIAGNOSIS — N186 End stage renal disease: Secondary | ICD-10-CM | POA: Diagnosis not present

## 2021-03-12 DIAGNOSIS — Z992 Dependence on renal dialysis: Secondary | ICD-10-CM | POA: Diagnosis not present

## 2021-03-13 DIAGNOSIS — Z1159 Encounter for screening for other viral diseases: Secondary | ICD-10-CM | POA: Diagnosis not present

## 2021-03-13 DIAGNOSIS — C9000 Multiple myeloma not having achieved remission: Secondary | ICD-10-CM | POA: Diagnosis not present

## 2021-03-14 DIAGNOSIS — Z992 Dependence on renal dialysis: Secondary | ICD-10-CM | POA: Diagnosis not present

## 2021-03-14 DIAGNOSIS — N2581 Secondary hyperparathyroidism of renal origin: Secondary | ICD-10-CM | POA: Diagnosis not present

## 2021-03-14 DIAGNOSIS — N186 End stage renal disease: Secondary | ICD-10-CM | POA: Diagnosis not present

## 2021-03-17 DIAGNOSIS — N2581 Secondary hyperparathyroidism of renal origin: Secondary | ICD-10-CM | POA: Diagnosis not present

## 2021-03-17 DIAGNOSIS — Z992 Dependence on renal dialysis: Secondary | ICD-10-CM | POA: Diagnosis not present

## 2021-03-17 DIAGNOSIS — N186 End stage renal disease: Secondary | ICD-10-CM | POA: Diagnosis not present

## 2021-03-19 DIAGNOSIS — N186 End stage renal disease: Secondary | ICD-10-CM | POA: Diagnosis not present

## 2021-03-19 DIAGNOSIS — N2581 Secondary hyperparathyroidism of renal origin: Secondary | ICD-10-CM | POA: Diagnosis not present

## 2021-03-19 DIAGNOSIS — Z992 Dependence on renal dialysis: Secondary | ICD-10-CM | POA: Diagnosis not present

## 2021-03-20 ENCOUNTER — Inpatient Hospital Stay (HOSPITAL_COMMUNITY): Payer: Medicare PPO | Attending: Hematology

## 2021-03-20 ENCOUNTER — Encounter (HOSPITAL_COMMUNITY): Payer: Self-pay

## 2021-03-20 ENCOUNTER — Inpatient Hospital Stay (HOSPITAL_COMMUNITY): Payer: Medicare PPO

## 2021-03-20 VITALS — BP 114/64 | HR 58 | Temp 97.6°F | Resp 18 | Ht 63.0 in | Wt 131.7 lb

## 2021-03-20 DIAGNOSIS — Z79811 Long term (current) use of aromatase inhibitors: Secondary | ICD-10-CM | POA: Insufficient documentation

## 2021-03-20 DIAGNOSIS — Z79899 Other long term (current) drug therapy: Secondary | ICD-10-CM | POA: Insufficient documentation

## 2021-03-20 DIAGNOSIS — C9000 Multiple myeloma not having achieved remission: Secondary | ICD-10-CM | POA: Insufficient documentation

## 2021-03-20 DIAGNOSIS — Z5112 Encounter for antineoplastic immunotherapy: Secondary | ICD-10-CM | POA: Insufficient documentation

## 2021-03-20 DIAGNOSIS — Z170 Estrogen receptor positive status [ER+]: Secondary | ICD-10-CM | POA: Diagnosis not present

## 2021-03-20 DIAGNOSIS — C50911 Malignant neoplasm of unspecified site of right female breast: Secondary | ICD-10-CM | POA: Diagnosis not present

## 2021-03-20 DIAGNOSIS — Z1159 Encounter for screening for other viral diseases: Secondary | ICD-10-CM | POA: Diagnosis not present

## 2021-03-20 LAB — COMPREHENSIVE METABOLIC PANEL
ALT: 15 U/L (ref 0–44)
AST: 21 U/L (ref 15–41)
Albumin: 3.9 g/dL (ref 3.5–5.0)
Alkaline Phosphatase: 89 U/L (ref 38–126)
Anion gap: 14 (ref 5–15)
BUN: 47 mg/dL — ABNORMAL HIGH (ref 8–23)
CO2: 22 mmol/L (ref 22–32)
Calcium: 9.6 mg/dL (ref 8.9–10.3)
Chloride: 95 mmol/L — ABNORMAL LOW (ref 98–111)
Creatinine, Ser: 7.34 mg/dL — ABNORMAL HIGH (ref 0.44–1.00)
GFR, Estimated: 5 mL/min — ABNORMAL LOW (ref >60)
Glucose, Bld: 111 mg/dL — ABNORMAL HIGH (ref 70–99)
Potassium: 4.6 mmol/L (ref 3.5–5.1)
Sodium: 131 mmol/L — ABNORMAL LOW (ref 135–145)
Total Bilirubin: 0.3 mg/dL (ref 0.3–1.2)
Total Protein: 7.1 g/dL (ref 6.5–8.1)

## 2021-03-20 LAB — CBC WITH DIFFERENTIAL/PLATELET
Abs Immature Granulocytes: 0.02 10*3/uL (ref 0.00–0.07)
Basophils Absolute: 0.1 10*3/uL (ref 0.0–0.1)
Basophils Relative: 1 %
Eosinophils Absolute: 0.1 10*3/uL (ref 0.0–0.5)
Eosinophils Relative: 3 %
HCT: 37.7 % (ref 36.0–46.0)
Hemoglobin: 12.1 g/dL (ref 12.0–15.0)
Immature Granulocytes: 0 %
Lymphocytes Relative: 19 %
Lymphs Abs: 1 10*3/uL (ref 0.7–4.0)
MCH: 31.2 pg (ref 26.0–34.0)
MCHC: 32.1 g/dL (ref 30.0–36.0)
MCV: 97.2 fL (ref 80.0–100.0)
Monocytes Absolute: 0.5 10*3/uL (ref 0.1–1.0)
Monocytes Relative: 10 %
Neutro Abs: 3.6 10*3/uL (ref 1.7–7.7)
Neutrophils Relative %: 67 %
Platelets: 264 10*3/uL (ref 150–400)
RBC: 3.88 MIL/uL (ref 3.87–5.11)
RDW: 15.3 % (ref 11.5–15.5)
WBC: 5.4 10*3/uL (ref 4.0–10.5)
nRBC: 0 % (ref 0.0–0.2)

## 2021-03-20 LAB — MAGNESIUM: Magnesium: 2.2 mg/dL (ref 1.7–2.4)

## 2021-03-20 MED ORDER — DEXAMETHASONE 4 MG PO TABS
10.0000 mg | ORAL_TABLET | Freq: Once | ORAL | Status: AC
  Administered 2021-03-20: 10 mg via ORAL
  Filled 2021-03-20: qty 3

## 2021-03-20 MED ORDER — BORTEZOMIB CHEMO SQ INJECTION 3.5 MG (2.5MG/ML)
1.3000 mg/m2 | Freq: Once | INTRAMUSCULAR | Status: AC
  Administered 2021-03-20: 2 mg via SUBCUTANEOUS
  Filled 2021-03-20: qty 0.8

## 2021-03-20 MED ORDER — PROCHLORPERAZINE MALEATE 10 MG PO TABS
10.0000 mg | ORAL_TABLET | Freq: Once | ORAL | Status: AC
  Administered 2021-03-20: 10 mg via ORAL
  Filled 2021-03-20: qty 1

## 2021-03-20 NOTE — Patient Instructions (Signed)
[facility]  Discharge Instructions: Thank you for choosing [facility] to provide your oncology and hematology care.  If you have a lab appointment with the [facility], please come in thru the Main Entrance and check in at the main information desk.  Wear comfortable clothing and clothing appropriate for easy access to any Portacath or PICC line.   We strive to give you quality time with your provider. You may need to reschedule your appointment if you arrive late (15 or more minutes).  Arriving late affects you and other patients whose appointments are after yours.  Also, if you miss three or more appointments without notifying the office, you may be dismissed from the clinic at the providers discretion.      For prescription refill requests, have your pharmacy contact our office and allow 72 hours for refills to be completed.    Today you received the following chemotherapy and/or immunotherapy agents Velcade .       To help prevent nausea and vomiting after your treatment, we encourage you to take your nausea medication as directed.  BELOW ARE SYMPTOMS THAT SHOULD BE REPORTED IMMEDIATELY: *FEVER GREATER THAN 100.4 F (38 C) OR HIGHER *CHILLS OR SWEATING *NAUSEA AND VOMITING THAT IS NOT CONTROLLED WITH YOUR NAUSEA MEDICATION *UNUSUAL SHORTNESS OF BREATH *UNUSUAL BRUISING OR BLEEDING *URINARY PROBLEMS (pain or burning when urinating, or frequent urination) *BOWEL PROBLEMS (unusual diarrhea, constipation, pain near the anus) TENDERNESS IN MOUTH AND THROAT WITH OR WITHOUT PRESENCE OF ULCERS (sore throat, sores in mouth, or a toothache) UNUSUAL RASH, SWELLING OR PAIN  UNUSUAL VAGINAL DISCHARGE OR ITCHING   Items with * indicate a potential emergency and should be followed up as soon as possible or go to the Emergency Department if any problems should occur.  Please show the CHEMOTHERAPY ALERT CARD or IMMUNOTHERAPY ALERT CARD at check-in to the Emergency  Department and triage nurse.  Should you have questions after your visit or need to cancel or reschedule your appointment, please contact [facility] [phone]  and follow the prompts.  Office hours are 8:00 a.m. to 4:30 p.m. Monday - Friday. Please note that voicemails left after 4:00 p.m. may not be returned until the following business day.  We are closed weekends and major holidays. You have access to a nurse at all times for urgent questions. Please call the main number to the clinic [phone] and follow the prompts.  For any non-urgent questions, you may also contact your provider using MyChart. We now offer e-Visits for anyone 71 and older to request care online for non-urgent symptoms. For details visit [email or web address].   Also download the MyChart app! Go to the app store, search "MyChart", open the app, select [facility], and log in with your MyChart username and password.  Due to Covid, a mask is required upon entering the hospital/clinic. If you do not have a mask, one will be given to you upon arrival. For doctor visits, patients may have 1 support person aged 54 or older with them. For treatment visits, patients cannot have anyone with them due to current Covid guidelines and our immunocompromised population.

## 2021-03-20 NOTE — Progress Notes (Signed)
Patient presents today for Velcade injection D15. Labs within parameters for treatment. Creatinine elevated. Patient is on dialysis. Ignore creatinine for treatment per treatment plan. Vital signs within parameters for treatment.   Treatment given today per MD orders. Tolerated infusion without adverse affects. Vital signs stable. No complaints at this time. Discharged from clinic by wheel chair in stable condition. Alert and oriented x 3. F/U with [facility] as scheduled.

## 2021-03-21 ENCOUNTER — Other Ambulatory Visit (HOSPITAL_COMMUNITY): Payer: Medicare PPO

## 2021-03-21 ENCOUNTER — Ambulatory Visit (HOSPITAL_COMMUNITY): Payer: Medicare PPO

## 2021-03-21 DIAGNOSIS — Z992 Dependence on renal dialysis: Secondary | ICD-10-CM | POA: Diagnosis not present

## 2021-03-21 DIAGNOSIS — N2581 Secondary hyperparathyroidism of renal origin: Secondary | ICD-10-CM | POA: Diagnosis not present

## 2021-03-21 DIAGNOSIS — N186 End stage renal disease: Secondary | ICD-10-CM | POA: Diagnosis not present

## 2021-03-24 DIAGNOSIS — N2581 Secondary hyperparathyroidism of renal origin: Secondary | ICD-10-CM | POA: Diagnosis not present

## 2021-03-24 DIAGNOSIS — Z992 Dependence on renal dialysis: Secondary | ICD-10-CM | POA: Diagnosis not present

## 2021-03-24 DIAGNOSIS — N186 End stage renal disease: Secondary | ICD-10-CM | POA: Diagnosis not present

## 2021-03-26 DIAGNOSIS — N2581 Secondary hyperparathyroidism of renal origin: Secondary | ICD-10-CM | POA: Diagnosis not present

## 2021-03-26 DIAGNOSIS — N186 End stage renal disease: Secondary | ICD-10-CM | POA: Diagnosis not present

## 2021-03-26 DIAGNOSIS — Z992 Dependence on renal dialysis: Secondary | ICD-10-CM | POA: Diagnosis not present

## 2021-03-27 ENCOUNTER — Other Ambulatory Visit (HOSPITAL_COMMUNITY)
Admission: RE | Admit: 2021-03-27 | Discharge: 2021-03-27 | Disposition: A | Discharge status: Home / Self Care | Payer: Medicare PPO | Source: Skilled Nursing Facility | Attending: Adult Health | Admitting: Adult Health

## 2021-03-27 ENCOUNTER — Encounter: Payer: Self-pay | Admitting: Adult Health

## 2021-03-27 ENCOUNTER — Non-Acute Institutional Stay (SKILLED_NURSING_FACILITY): Payer: Medicare PPO | Admitting: Adult Health

## 2021-03-27 DIAGNOSIS — D631 Anemia in chronic kidney disease: Secondary | ICD-10-CM

## 2021-03-27 DIAGNOSIS — E1122 Type 2 diabetes mellitus with diabetic chronic kidney disease: Secondary | ICD-10-CM

## 2021-03-27 DIAGNOSIS — Z1159 Encounter for screening for other viral diseases: Secondary | ICD-10-CM | POA: Diagnosis not present

## 2021-03-27 DIAGNOSIS — N186 End stage renal disease: Secondary | ICD-10-CM

## 2021-03-27 DIAGNOSIS — Z992 Dependence on renal dialysis: Secondary | ICD-10-CM | POA: Diagnosis not present

## 2021-03-27 DIAGNOSIS — C9000 Multiple myeloma not having achieved remission: Secondary | ICD-10-CM

## 2021-03-27 LAB — HEMOGLOBIN A1C
Hgb A1c MFr Bld: 6.1 % — ABNORMAL HIGH (ref 4.8–5.6)
Mean Plasma Glucose: 128.37 mg/dL

## 2021-03-27 NOTE — Progress Notes (Signed)
° ° °Location:  [facility] °Nursing Home Room Number: 148 °Place of Service:  SNF (31) ° ° °CODE STATUS: dnr  ° °Allergies  °Allergen Reactions  ° Ace Inhibitors Other (See Comments) and Cough  °  Tongue swell , ie angioedema  ° Angiotensin Receptor Blockers   °  Angioedema with ACE-I  ° Penicillins Other (See Comments)  °  Unsure of reaction °Has patient had a PCN reaction causing immediate rash, facial/tongue/throat swelling, SOB or lightheadedness with hypotension: Unknown °Has patient had a PCN reaction causing severe rash involving mucus membranes or skin necrosis: Unknown °Has patient had a PCN reaction that required hospitalization: No °Has patient had a PCN reaction occurring within the last 10 years: Unknown °If all of the above answers are "NO", then may proceed with Cephalosporin use. ° °  ° Penicillin G   ° ° °Chief Complaint  °Patient presents with  ° Medical Management of Chronic Issues ° °             Diabetes mellitus type 2 with end stage renal disease (ESRD)   Anemia due to end stage renal disease:  Stage 1 infiltrating ductal carcinoma of female right breast Multiple myeloma not having achieved remission: °  ° ° °HPI: ° °She is a 85 year old long term resident of this facility being seen for the management of her chronic illnesses: Diabetes mellitus type 2 with end stage renal disease (ESRD)   Anemia due to end stage renal disease:  Stage 1 infiltrating ductal carcinoma of female right breast Multiple myeloma not having achieved remission.  There are no reports of uncontrolled pain. No reports of changes in appetite no reports of anxiety present.  ° °Past Medical History:  °Diagnosis Date  ° Anemia   °  chronic macrocytic anemia  ° Anxiety   ° Chronic kidney disease   ° Chronic renal disease, stage 4, severely decreased glomerular filtration rate (GFR) between 15-29 mL/min/1.73 square meter (HCC) 08/22/2015  ° Complication of anesthesia   ° delirious after Breast Surgery  ° Dementia  (HCC)   ° mild  ° Depression   ° Diabetes mellitus with ESRD (end-stage renal disease) (HCC)   ° type II  ° Dysphagia   ° Dyspnea   ° with activity  ° GERD (gastroesophageal reflux disease)   ° Glaucoma   ° Hyperlipidemia   ° Hypertension   ° Pneumonia   ° Stage 1 infiltrating ductal carcinoma of right female breast (HCC) 08/21/2015  ° ER+ PR+ HER 2 neu + (3+) T1cN0   ° ° °Past Surgical History:  °Procedure Laterality Date  ° A/V FISTULAGRAM Right 07/05/2020  ° Procedure: A/V Fistulagram;  Surgeon: Fields, Charles E, MD;  [facility]: [facility];  Service: Cardiovascular;  Laterality: Right;  ° AV FISTULA PLACEMENT Left 11/22/2017  ° Procedure: ARTERIOVENOUS (AV) FISTULA CREATION LEFT ARM;  Surgeon: Fields, Charles E, MD;  Location: [facility] OR;  Service: Vascular;  Laterality: Left;  ° AV FISTULA PLACEMENT Right 04/04/2020  ° Procedure: RIGHT ARM ARTERIOVENOUS FISTULA CREATION;  Surgeon: Early, Todd F, MD;  Location: [facility] ORS;  Service: Vascular;  Laterality: Right;  ° AV FISTULA PLACEMENT Right 08/20/2020  ° Procedure: ARTERIOVENOUS (AV) FISTULA LIGATION RIGHT ARM;  Surgeon: Fields, Charles E, MD;  Location: [facility] OR;  Service: Vascular;  Laterality: Right;  ° BIOPSY  08/07/2016  ° Procedure: BIOPSY;  Surgeon: Rourk, Robert M, MD;  Location: [facility];  Service: Endoscopy;;  gastric ulcer   ulcer biopsy   COLONOSCOPY     ESOPHAGOGASTRODUODENOSCOPY N/A 08/07/2016   LA Grade A esophagitis s/p dilation, small hiatal hernia, multiple gastric ulcers and erosions, duodenal erosions s/p biopsy. Negative H.pylori    ESOPHAGOGASTRODUODENOSCOPY N/A 11/27/2016   normal esophagus, previously noted gastric ulcers completely healed, normal duodenum.    ESOPHAGOGASTRODUODENOSCOPY (EGD) WITH PROPOFOL N/A 07/23/2020   Procedure: ESOPHAGOGASTRODUODENOSCOPY (EGD) WITH PROPOFOL;  Surgeon: Rogene Houston, MD;  [facility]: [facility];  Service: Endoscopy;  Laterality: N/A;   FISTULA SUPERFICIALIZATION Left 02/14/2018   Procedure: FISTULA  SUPERFICIALIZATION LEFT ARM;  Surgeon: Angelia Mould, MD;  Location: [facility] OR;  Service: Vascular;  Laterality: Left;   FRACTURE SURGERY Right    ankle   HOT HEMOSTASIS  07/23/2020   Procedure: HOT HEMOSTASIS (ARGON PLASMA COAGULATION/BICAP);  Surgeon: Rogene Houston, MD;  [facility]: [facility];  Service: Endoscopy;;   INTRAMEDULLARY (IM) NAIL INTERTROCHANTERIC Right 07/12/2020   Procedure: INTRAMEDULLARY (IM) NAIL INTERTROCHANTRIC;  Surgeon: Mordecai Rasmussen, MD;  Location: [facility];  Service: Orthopedics;  Laterality: Right;   MALONEY DILATION N/A 08/07/2016   Procedure: Venia Minks DILATION;  Surgeon: Daneil Dolin, MD;  [facility];  Service: Endoscopy;  Laterality: N/A;   MASTECTOMY, PARTIAL Right    multiple myeloma     PERIPHERAL VASCULAR BALLOON ANGIOPLASTY Left 07/13/2019   Procedure: PERIPHERAL VASCULAR BALLOON ANGIOPLASTY;  Surgeon: Marty Heck, MD;  Location: [facility] CV LAB;  Service: Cardiovascular;  Laterality: Left;  arm fistulogram   PERIPHERAL VASCULAR BALLOON ANGIOPLASTY Right 05/22/2020   Procedure: PERIPHERAL VASCULAR BALLOON ANGIOPLASTY;  Surgeon: Cherre Robins, MD;  Location: [facility] CV LAB;  Service: Cardiovascular;  Laterality: Right;  arm fistula   PERIPHERAL VASCULAR BALLOON ANGIOPLASTY Right 07/05/2020   Procedure: PERIPHERAL VASCULAR BALLOON ANGIOPLASTY;  Surgeon: Elam Dutch, MD;  Location: [facility] CV LAB;  Service: Cardiovascular;  Laterality: Right;  arm fistula   PORT-A-CATH REMOVAL Left 11/22/2017   Procedure: REMOVAL PORT-A-CATH LEFT CHEST;  Surgeon: Elam Dutch, MD;  Location: [facility];  Service: Vascular;  Laterality: Left;   RETINAL DETACHMENT SURGERY Right    SCLEROTHERAPY  07/23/2020   Procedure: SCLEROTHERAPY;  Surgeon: Rogene Houston, MD;  [facility];  Service: Endoscopy;;   UPPER EXTREMITY VENOGRAPHY N/A 08/22/2020   Procedure: LEFT UPPER & CENTRAL VENOGRAPHY;  Surgeon: Marty Heck, MD;   Location: [facility] CV LAB;  Service: Cardiovascular;  Laterality: N/A;    Social History   Socioeconomic History   Marital status: Single    Spouse name: Not on file   Number of children: Not on file   Years of education: Not on file   Highest education level: Not on file  Occupational History   Occupation: retired   Tobacco Use   Smoking status: Never   Smokeless tobacco: Never  Vaping Use   Vaping Use: Never used  Substance and Sexual Activity   Alcohol use: No    Alcohol/week: 0.0 standard drinks   Drug use: No   Sexual activity: Never  Other Topics Concern   Not on file  Social History Narrative   Long term resident of SNF    Social Determinants of Health   Financial Resource Strain: Not on file  Food Insecurity: Not on file  Transportation Needs: Not on file  Physical Activity: Not on file  Stress: Not on file  Social Connections: Not on file  Intimate Partner Violence: Not on file  History  °Problem Relation Age of Onset  ° Multiple myeloma Sister   ° Brain cancer Sister   ° Dementia Mother   °     died at 90  ° Stroke Mother   ° Heart failure Mother   ° Diabetes Mother   ° Heart disease Father   ° Prostate cancer Brother   ° Colon cancer Neg Hx   ° ° ° ° °VITAL SIGNS °BP 120/66    Pulse 84    Temp 97.6 °F (36.4 °C)    Ht 5' 3" (1.6 m)    Wt 139 lb 6.4 oz (63.2 kg)    BMI 24.69 kg/m²  ° °Outpatient Encounter Medications as of 03/27/2021  °Medication Sig  ° acetaminophen (TYLENOL) 650 MG CR tablet Take 650 mg by mouth every 8 (eight) hours.  ° acyclovir (ZOVIRAX) 200 MG capsule Take 200 mg by mouth in the morning. (0800)  ° Amino Acids-Protein Hydrolys (FEEDING SUPPLEMENT, PRO-STAT SUGAR FREE 64,) LIQD Take 30 mLs by mouth 3 (three) times daily with meals. (0800, 1200 & 1800)  ° anastrozole (ARIMIDEX) 1 MG tablet TAKE 1 TABLET BY MOUTH DAILY  ° atenolol (TENORMIN) 25 MG tablet Take 1 tablet (25 mg total) by mouth in the morning. (0800)  ° atorvastatin (LIPITOR) 10  MG tablet Take 10 mg by mouth at bedtime. (2000)  ° Balsam [place]-Castor Oil (VENELEX) OINT Apply topically. Apply to bilateral buttocks and sacrum every shift.  ° Calcium Acetate 667 MG TABS Take 667-1,334 mg by mouth See admin instructions. Take 2 tablets (1334 mg) by mouth with each meal (0800 &1730) and take 1 tablet (667 mg) by mouth with each snack (2000)  ° calcium-vitamin D (OSCAL WITH D) 500-200 MG-UNIT tablet Take 1 tablet by mouth in the morning. (0800)  ° cloNIDine (CATAPRES) 0.1 MG tablet Take 1 tablet (0.1 mg total) by mouth at bedtime. (2000) Hold for SBP < 110 bpm  ° insulin glargine (LANTUS SOLOSTAR) 100 UNIT/ML Solostar Pen Inject 10 Units into the skin at bedtime. (2000)  ° melatonin 3 MG TABS tablet Take 6 mg by mouth at bedtime. (2000) For Sleep  ° multivitamin (RENA-VIT) TABS tablet Take 1 tablet by mouth at bedtime. (2100)  ° NON FORMULARY Diet-Regular Diet with thin liquids  ° ondansetron (ZOFRAN) 4 MG tablet Take 1 tablet (4 mg total) by mouth every 6 (six) hours as needed for nausea.  ° pantoprazole (PROTONIX) 40 MG tablet Take 40 mg by mouth 2 (two) times daily.  ° sucralfate (CARAFATE) 1 GM/10ML suspension Take 1 g by mouth in the morning, at noon, in the evening, and at bedtime. (0800, 1200, 1700 & 2100)  ° °No facility-administered encounter medications on file as of 03/27/2021.  ° ° ° °SIGNIFICANT DIAGNOSTIC EXAMS ° °PREVIOUS  ° °07-11-20: chest x-ray:  °Central catheter tip at cavoatrial junction. No pneumothorax. Mild left base atelectasis. No edema or airspace opacity. Stable cardiac silhouette. Aortic Atherosclerosis  ° °07-11-20: right knee x-ray:  °No acute fracture or dislocation is noted. Diffuse vascular calcifications are seen. No soft tissue abnormality is noted. ° °07-11-20: right hip x-ray:  °Acute, impacted, angulated right basicervical femoral neck fracture.  ° °07-23-20: upper endoscopy:  °Normal hypopharynx. °- Normal esophagus. °- Z-line irregular, 34 cm from the incisors. °-  Non-bleeding gastric ulcers with no stigmata of bleeding. °- Duodenal ulcer with stigmata of bleed/ pigmented material. Gold probe therapy was not °successful and hemostasis achieved with injection of dilute epinephrine. °- Non-bleeding duodenal   duodenal ulcer with no stigmata of bleeding(second bulbar ulcer) - Normal second portion of the duodenum. - No specimens collected.  07-23-20: chest x-ray:No active disease. Aortic Atherosclerosis    NO NEW EXAMS.   LABS REVIEWED PREVIOUS  03-26-20: wbc 6.3; hgb 9.4; hct 30.4; mcv 104.1; plt 300; glucose 223; bun 101; creat 14.22; k+ 5.8; na++ 135; ca 8.1 GFR 2; liver normal albumin 3.4  05-21-20; wbc 5.1; hgb 10.4; hct 32.8; mcv 106.8 plt 303; glucose 186; bun 41; creat 1.54; k+ 5.0; na++ 132; ca 8.4 GFR 5; liver normal albumin 3.7 06-13-20: chol 189; ldl 106; trig 211; hdl 41; hgb a1c 6.2   07-11-20: wbc 9.1; hgb 10.6; hct 34.6; mcv 109.1 plt 248; glucose 79; bun 45; creat 7.71 k+ 4.9; na++ 134; ca 8.1 GFR 5; liver normal albumin 3.5 mag 2.4 07-13-20: wbc 11.4; hgb 8.9; hct 29.7; mcv 108.8 plt 303; glucose 178; bun 66; creat 11.17; k+ 6.6; na++ 135; ca 8.2; GFR 3 07-15-20: wbc 11.0; hgb 7.5; hct 25.2; mcv 108.2 plt 251; glucose 117; bun 60; creat 9.41; k+ 5.5; na++ 138; ca 8.4 GFR 4 07-17-20: wbc 11.5; hgb 9.4; hct 29.8; mcv 100.3 plt 300; glucose 140; bun 68; creat 8.04; k+ 4.2; na++ 135; ca 8.5 GFR 5 07-22-20: wbc 11.6; hgb 7.1; hct 23.5; mcv 104.0 plt 444; glucose 214; bun 97; creat 9.18; k+ 4.5; na++ 140; ca 8.0; GFR4 07-24-20: wbc 14.3; hgb 6.7; hct 21.2; mcv 99.5 plt 385; glucose 96; bun 49; creat 6.55; k+ 4.7; na++ 133; ca 8.0; GFR4; mag 2.0 phos 3.8 07-26-20: wbc 10.4; hgb 8.5; hct 26.8; mcv 98.9 plt 430; glucose 111; bun 40;creat 6.90; k+ 4.2; na++ 133; ca 8.5; GFR 6; mag 2.2; phos 3.3    07-30-20: wbc 7.6; hgb 7.3; hct 24.1; mcv 103.4 plt 345; glucose 97; bun 26; creat 5.89; k+ 4.1; na++ 139; ca 8.6; GFR 7; liver normal albumin 2.8.  09-03-20: wbc 5.8; hgb 11.2; hct  37.3; mcv 102.8 plt 274; glucose 175; bun 38; creat 5.72; k+ 3.1; na++ 138; ca 9.2 GFR 7 alk phos 138; albumin 3.4 mag 2.0 09-30-20: wbc 5.6; hgb 13.1; hct 42.6; mcv 97.9 plt 260; glucose 92; bun 23; creat 4.18; k+ 3.1; na++ 134; ca 8.7; GFR 10; liver normal albumin 3.8 10-29-20: wbc 5.3; hgb 12.1; hct 40.0; mcv 96.6 plt 218; glucose 190; bun 54; creat 7.35 k+ 4.0; na++ 134; ca 9.4; GFR 5 liver normal albumin 3.7; mag 2.3 11-19-20: wbc 10.9; hgb 11.3; hct 37.2; mcv 96.6 plt 242; glucose 106; bun 47; creat 6.58; k+ 3.9; na++ 133; ca 9.0; GFR 6 liver normal albumin 3.5  12-02-20: hgb a1c 5.6; chol 167; ldl 83; trig 229; hdl 38 12-03-20; wbc 5.2; hgb 11.1; hct 35.7; mcv 96.5 plt 320; glucose 139; bun 47; creat 6.05; k+ 4.1; na++ 134; ca 9.1; GFR 6; liver normal albumin 3.8 mag 2.4  12-17-20: wbc 5.2; hgb 10.5; hct 33.8; mcv 98.8 plt 256; glucose 136; bun 43; creat 6.19; k+ 3.6; na++ 137; ca 8.9; GFR 6 liver normal albumin 3.9  03-06-21: wbc 4.7; hgb 12.3; hct 38.2; mcv 101.6 plt 221; glucose 205; bun 51; creat 7.06; k+ 4.5; na++ 133; ca 9.1; GFR 5; liver normal albumin 3.8   TODAY  03-20-21: wbc 5.4; hgb 12.1; hct 37.7; mcv 97.2 plt 254; glucose 111; bun 47; creat 7.34; k+ 4.6; na++ 131; ca 9.6 GFR 5; liver normal albumin 3.9; mag 2.2   Review of Systems  Constitutional:  Negative for malaise/fatigue.  °Respiratory:  Negative for cough and shortness of breath.   °Cardiovascular:  Negative for chest pain, palpitations and leg swelling.  °Gastrointestinal:  Negative for abdominal pain, constipation and heartburn.  °Musculoskeletal:  Negative for back pain, joint pain and myalgias.  °Skin: Negative.   °Neurological:  Negative for dizziness.  °Psychiatric/Behavioral:  The patient is not nervous/anxious.   ° °Physical Exam °Constitutional:   °   General: She is not in acute distress. °   Appearance: She is well-developed. She is not diaphoretic.  °Neck:  °   Thyroid: No thyromegaly.  °Cardiovascular:  °   Rate and  Rhythm: Normal rate and regular rhythm.  °   Pulses: Normal pulses.  °   Heart sounds: Normal heart sounds.  °Pulmonary:  °   Effort: Pulmonary effort is normal. No respiratory distress.  °   Breath sounds: Normal breath sounds.  °Chest:  °Breasts: °   Right: Normal.  °Abdominal:  °   General: Bowel sounds are normal. There is no distension.  °   Palpations: Abdomen is soft.  °   Tenderness: There is no abdominal tenderness.  °Musculoskeletal:  °   Cervical back: Neck supple.  °   Right lower leg: No edema.  °   Left lower leg: No edema.  °   Comments: Right hip IM nail 07-12-20    °Is able to move all extremities     °Lymphadenopathy:  °   Cervical: No cervical adenopathy.  °Skin: °   General: Skin is warm and dry.  °   Comments: Chest wall dialysis access   °Neurological:  °   Mental Status: She is alert. Mental status is at baseline.  °Psychiatric:     °   Mood and Affect: Mood normal.  ° ° °ASSESSMENT/ PLAN: ° °TODAY ° °Diabetes mellitus type 2 with end stage renal disease (ESRD) is having low AM cbg readings; hgb a1c 5.8 will lower lantus to 5 units nightly results of A1C are pending  ° °2. Anemia due to end stage renal disease: is stable hgb 12.1; will monitor ° °3. Stage 1 infiltrating ductal carcinoma of female right breast/status post mastectomy on right. Will continue arimidex 1 mg daily  ° °4. Multiple myeloma not having achieved remission: is followed by oncology  ° °PREVIOUS  ° °5. Herpes: is without outbreak: will continue acyclovir 200 mg daily  ° °6. Major depression chronic recurrent is stable will continue zoloft 100 mg daily  ° °7. Protein calorie malnutrition, severe: is stable albumin is 3.9 will continue supplement as directed ° °8. Multiple gastric ulcers: duodenal ulcer: upper GI bleed: esophageal reflux disease without esophagitis: is stable will continue protonix 40 mg twice daily carafate 1 gm four times daily  ° °9. Closed right hip fracture sequela: is stable is status right hip nail  07-12-20: will continue tylenol 605 mg three times daily  ° °10. Chronic kidney disease with end stage renal disease on dialysis due to type 2 diabetes mellitus/dependence on dialysis: is stable is on hemodialysis three days weekly 1200 cc fluid restriction; will continue calcium acetate: 1334 mg with breakfast and supper 667 mg with lunch and 667 mg with snacks ° °11. Hypertension due to end stage renal disease (ESRD) due to type 2 diabetes mellitus is stable b/p 120/66 norvasc 10 mg daily tenormin 25 mg daily clonidine 0.1 mg daily  ° °12. Hyperlipidemia associated with type 2 diabetes mellitus: is stable LDL 83 will   continue lipitor 10 mg daily  ° °13.  Aortic atherosclerosis/atherosclerotic peripheral vascular disease: is stable (ct7-28-20) ° °14. Vascular dementia with behavioral disturbance: is without change weight is 139 pounds will monitor  ° ° ° ° °  NP °[facility]  °call [phone]  ° °

## 2021-03-28 DIAGNOSIS — Z992 Dependence on renal dialysis: Secondary | ICD-10-CM | POA: Diagnosis not present

## 2021-03-28 DIAGNOSIS — N2581 Secondary hyperparathyroidism of renal origin: Secondary | ICD-10-CM | POA: Diagnosis not present

## 2021-03-28 DIAGNOSIS — N186 End stage renal disease: Secondary | ICD-10-CM | POA: Diagnosis not present

## 2021-03-31 DIAGNOSIS — N2581 Secondary hyperparathyroidism of renal origin: Secondary | ICD-10-CM | POA: Diagnosis not present

## 2021-03-31 DIAGNOSIS — Z992 Dependence on renal dialysis: Secondary | ICD-10-CM | POA: Diagnosis not present

## 2021-03-31 DIAGNOSIS — N186 End stage renal disease: Secondary | ICD-10-CM | POA: Diagnosis not present

## 2021-04-01 ENCOUNTER — Encounter: Payer: Self-pay | Admitting: Adult Health

## 2021-04-01 NOTE — Progress Notes (Signed)
Location:  [place] Room Number: 148-D Place of Service:  SNF (31)   CODE STATUS: DNR  Allergies  Allergen Reactions   Ace Inhibitors Other (See Comments) and Cough    Tongue swell , ie angioedema   Angiotensin Receptor Blockers     Angioedema with ACE-I   Penicillins Other (See Comments)    Unsure of reaction Has patient had a PCN reaction causing immediate rash, facial/tongue/throat swelling, SOB or lightheadedness with hypotension: Unknown Has patient had a PCN reaction causing severe rash involving mucus membranes or skin necrosis: Unknown Has patient had a PCN reaction that required hospitalization: No Has patient had a PCN reaction occurring within the last 10 years: Unknown If all of the above answers are "NO", then may proceed with Cephalosporin use.     Penicillin G     Chief Complaint  Patient presents with   Acute Visit    Vomiting     HPI:    Past Medical History:  Diagnosis Date   Anemia     chronic macrocytic anemia   Anxiety    Chronic kidney disease    Chronic renal disease, stage 4, severely decreased glomerular filtration rate (GFR) between 15-29 mL/min/1.73 square meter ([place]) 55/73/2202   Complication of anesthesia    delirious after Breast Surgery   Dementia ([place])    mild   Depression    Diabetes mellitus with ESRD (end-stage renal disease) ([place])    type II   Dysphagia    Dyspnea    with activity   GERD (gastroesophageal reflux disease)    Glaucoma    Hyperlipidemia    Hypertension    Pneumonia    Stage 1 infiltrating ductal carcinoma of right female breast ([place]) 08/21/2015   ER+ PR+ HER 2 neu + (3+) T1cN0     Past Surgical History:  Procedure Laterality Date   A/V FISTULAGRAM Right 07/05/2020   Procedure: A/V Fistulagram;  Surgeon: Elam Dutch, MD;  Location: [place] CV LAB;  Service: Cardiovascular;  Laterality: Right;   AV FISTULA PLACEMENT Left 11/22/2017   Procedure: ARTERIOVENOUS (AV) FISTULA  CREATION LEFT ARM;  Surgeon: Elam Dutch, MD;  Location: [facility] OR;  Service: Vascular;  Laterality: Left;   AV FISTULA PLACEMENT Right 04/04/2020   Procedure: RIGHT ARM ARTERIOVENOUS FISTULA CREATION;  Surgeon: Rosetta Posner, MD;  Location: [facility] OR[facility];  Service: Vascular;  Laterality: Right;   AV FISTULA PLACEMENT Right 08/20/2020   Procedure: ARTERIOVENOUS (AV) FISTULA LIGATION RIGHT ARM;  Surgeon: Elam Dutch, MD;  Location: [facility] OR;  Service: Vascular;  Laterality: Right;   BIOPSY  08/07/2016   Procedure: BIOPSY;  Surgeon: Daneil Dolin, MD;  [facility];  Service: Endoscopy;;  gastric ulcer biopsy   COLONOSCOPY     ESOPHAGOGASTRODUODENOSCOPY N/A 08/07/2016   LA Grade A esophagitis s/p dilation, small hiatal hernia, multiple gastric ulcers and erosions, duodenal erosions s/p biopsy. Negative H.pylori    ESOPHAGOGASTRODUODENOSCOPY N/A 11/27/2016   normal esophagus, previously noted gastric ulcers completely healed, normal duodenum.    ESOPHAGOGASTRODUODENOSCOPY (EGD) WITH PROPOFOL N/A 07/23/2020   Procedure: ESOPHAGOGASTRODUODENOSCOPY (EGD) WITH PROPOFOL;  Surgeon: Rogene Houston, MD;  [facility];  Service: Endoscopy;  Laterality: N/A;   FISTULA SUPERFICIALIZATION Left 02/14/2018   Procedure: FISTULA SUPERFICIALIZATION LEFT ARM;  Surgeon: Angelia Mould, MD;  Location: [facility] OR;  Service: Vascular;  Laterality: Left;   FRACTURE SURGERY Right    ankle   HOT  HEMOSTASIS  07/23/2020   Procedure: HOT HEMOSTASIS (ARGON PLASMA COAGULATION/BICAP);  Surgeon: Rogene Houston, MD;  [facility];  Service: Endoscopy;;   INTRAMEDULLARY (IM) NAIL INTERTROCHANTERIC Right 07/12/2020   Procedure: INTRAMEDULLARY (IM) NAIL INTERTROCHANTRIC;  Surgeon: Mordecai Rasmussen, MD;  Location: [facility];  Service: Orthopedics;  Laterality: Right;   MALONEY DILATION N/A 08/07/2016   Procedure: Venia Minks DILATION;  Surgeon: Daneil Dolin, MD;  [facility];  Service: Endoscopy;   Laterality: N/A;   MASTECTOMY, PARTIAL Right    multiple myeloma     PERIPHERAL VASCULAR BALLOON ANGIOPLASTY Left 07/13/2019   Procedure: PERIPHERAL VASCULAR BALLOON ANGIOPLASTY;  Surgeon: Marty Heck, MD;  Location: [place] [facility] LAB;  Service: Cardiovascular;  Laterality: Left;  arm fistulogram   PERIPHERAL VASCULAR BALLOON ANGIOPLASTY Right 05/22/2020   Procedure: PERIPHERAL VASCULAR BALLOON ANGIOPLASTY;  Surgeon: Cherre Robins, MD;  Location: [place] [facility] LAB;  Service: Cardiovascular;  Laterality: Right;  arm fistula   PERIPHERAL VASCULAR BALLOON ANGIOPLASTY Right 07/05/2020   Procedure: PERIPHERAL VASCULAR BALLOON ANGIOPLASTY;  Surgeon: Elam Dutch, MD;  Location: [place] [facility] LAB;  Service: Cardiovascular;  Laterality: Right;  arm fistula   PORT-A-CATH REMOVAL Left 11/22/2017   Procedure: REMOVAL PORT-A-CATH LEFT CHEST;  Surgeon: Elam Dutch, MD;  Location: [place];  Service: Vascular;  Laterality: Left;   RETINAL DETACHMENT SURGERY Right    SCLEROTHERAPY  07/23/2020   Procedure: SCLEROTHERAPY;  Surgeon: Rogene Houston, MD;  [facility];  Service: Endoscopy;;   UPPER EXTREMITY VENOGRAPHY N/A 08/22/2020   Procedure: LEFT UPPER & CENTRAL VENOGRAPHY;  Surgeon: Marty Heck, MD;  Location: [place] [facility] LAB;  Service: Cardiovascular;  Laterality: N/A;    Social History   Socioeconomic History   Marital status: Single    Spouse name: Not on file   Number of children: Not on file   Years of education: Not on file   Highest education level: Not on file  Occupational History   Occupation: retired   Tobacco Use   Smoking status: Never   Smokeless tobacco: Never  Vaping Use   Vaping Use: Never used  Substance and Sexual Activity   Alcohol use: No    Alcohol/week: 0.0 standard drinks   Drug use: No   Sexual activity: Never  Other Topics Concern   Not on file  Social History Narrative   Long term resident of SNF    Social Determinants  of Health   Financial Resource Strain: Not on file  Food Insecurity: Not on file  Transportation Needs: Not on file  Physical Activity: Not on file  Stress: Not on file  Social Connections: Not on file  Intimate Partner Violence: Not on file   Family History  Problem Relation Age of Onset   Multiple myeloma Sister    Brain cancer Sister    Dementia Mother        died at 81   Stroke Mother    Heart failure Mother    Diabetes Mother    Heart disease Father    Prostate cancer Brother    Colon cancer Neg Hx       VITAL SIGNS BP 130/68    Pulse 81    Temp (!) 96.9 F (36.1 C)    Resp 16    Ht 5' 3" (1.6 m)    Wt 139 lb 6.4 oz (63.2 kg)    SpO2 98%    BMI 24.69 kg/m  Outpatient Encounter Medications as of 04/01/2021  Medication Sig   acetaminophen (TYLENOL) 325 MG tablet Take 650 mg by mouth every 8 (eight) hours.   acyclovir (ZOVIRAX) 200 MG capsule Take 200 mg by mouth in the morning. (0800)   Amino Acids-Protein Hydrolys (FEEDING SUPPLEMENT, PRO-STAT SUGAR FREE 64,) LIQD Take 30 mLs by mouth 3 (three) times daily with meals. (0800, 1200 & 1800)   ammonium lactate (AMLACTIN) 12 % cream Apply 1 application topically at bedtime.   anastrozole (ARIMIDEX) 1 MG tablet TAKE 1 TABLET BY MOUTH DAILY   atenolol (TENORMIN) 25 MG tablet Take 1 tablet (25 mg total) by mouth in the morning. (0800)   atorvastatin (LIPITOR) 10 MG tablet Take 10 mg by mouth at bedtime. (2000)   Balsam [place]-Castor Oil (VENELEX) OINT Apply topically. Apply to bilateral buttocks and sacrum every shift.   Calcium Acetate 667 MG TABS Take 667-1,334 mg by mouth See admin instructions. Take 2 tablets (1334 mg) by mouth with each meal (0800 &1730) and take 1 tablet (667 mg) by mouth with each snack (2000)   calcium-vitamin D (OSCAL WITH D) 500-200 MG-UNIT tablet Take 1 tablet by mouth in the morning. (0800)   cloNIDine (CATAPRES) 0.1 MG tablet Take 1 tablet (0.1 mg total) by mouth at bedtime. (2000) Hold for SBP < 110  bpm   insulin glargine (LANTUS SOLOSTAR) 100 UNIT/ML Solostar Pen Inject 5 Units into the skin at bedtime. (2000)   melatonin 3 MG TABS tablet Take 6 mg by mouth at bedtime. (2000) For Sleep   multivitamin (RENA-VIT) TABS tablet Take 1 tablet by mouth at bedtime. (2100)   NON FORMULARY Diet-Regular Diet with thin liquids   ondansetron (ZOFRAN) 4 MG tablet Take 1 tablet (4 mg total) by mouth every 6 (six) hours as needed for nausea.   pantoprazole (PROTONIX) 40 MG tablet Take 40 mg by mouth 2 (two) times daily.   sucralfate (CARAFATE) 1 GM/10ML suspension Take 1 g by mouth in the morning, at noon, in the evening, and at bedtime. (0800, 1200, 1700 & 2100)   [DISCONTINUED] acetaminophen (TYLENOL) 650 MG CR tablet Take 650 mg by mouth every 8 (eight) hours.   No facility-administered encounter medications on file as of 04/01/2021.     SIGNIFICANT DIAGNOSTIC EXAMS       ASSESSMENT/ PLAN:     Ok Edwards NP [facility]  Contact [phone] Monday through Friday 8am- 5pm  After hours call [phone]

## 2021-04-02 ENCOUNTER — Other Ambulatory Visit (HOSPITAL_COMMUNITY)
Admission: RE | Admit: 2021-04-02 | Discharge: 2021-04-02 | Disposition: A | Discharge status: Home / Self Care | Payer: Medicare PPO | Source: Skilled Nursing Facility | Attending: Adult Health | Admitting: Adult Health

## 2021-04-02 ENCOUNTER — Non-Acute Institutional Stay (SKILLED_NURSING_FACILITY): Payer: Medicare PPO | Admitting: Adult Health

## 2021-04-02 ENCOUNTER — Inpatient Hospital Stay (HOSPITAL_COMMUNITY): Payer: Medicare PPO | Attending: Adult Health

## 2021-04-02 ENCOUNTER — Encounter: Payer: Self-pay | Admitting: Adult Health

## 2021-04-02 DIAGNOSIS — R197 Diarrhea, unspecified: Secondary | ICD-10-CM

## 2021-04-02 DIAGNOSIS — D631 Anemia in chronic kidney disease: Secondary | ICD-10-CM | POA: Insufficient documentation

## 2021-04-02 DIAGNOSIS — R112 Nausea with vomiting, unspecified: Secondary | ICD-10-CM | POA: Diagnosis not present

## 2021-04-02 DIAGNOSIS — E1122 Type 2 diabetes mellitus with diabetic chronic kidney disease: Secondary | ICD-10-CM | POA: Insufficient documentation

## 2021-04-02 DIAGNOSIS — R109 Unspecified abdominal pain: Secondary | ICD-10-CM | POA: Diagnosis not present

## 2021-04-02 DIAGNOSIS — I129 Hypertensive chronic kidney disease with stage 1 through stage 4 chronic kidney disease, or unspecified chronic kidney disease: Secondary | ICD-10-CM | POA: Insufficient documentation

## 2021-04-02 LAB — COMPREHENSIVE METABOLIC PANEL
ALT: 14 U/L (ref 0–44)
AST: 19 U/L (ref 15–41)
Albumin: 3.9 g/dL (ref 3.5–5.0)
Alkaline Phosphatase: 81 U/L (ref 38–126)
Anion gap: 17 — ABNORMAL HIGH (ref 5–15)
BUN: 61 mg/dL — ABNORMAL HIGH (ref 8–23)
CO2: 24 mmol/L (ref 22–32)
Calcium: 9 mg/dL (ref 8.9–10.3)
Chloride: 95 mmol/L — ABNORMAL LOW (ref 98–111)
Creatinine, Ser: 9.01 mg/dL — ABNORMAL HIGH (ref 0.44–1.00)
GFR, Estimated: 4 mL/min — ABNORMAL LOW (ref >60)
Glucose, Bld: 129 mg/dL — ABNORMAL HIGH (ref 70–99)
Potassium: 4 mmol/L (ref 3.5–5.1)
Sodium: 136 mmol/L (ref 135–145)
Total Bilirubin: 0.5 mg/dL (ref 0.3–1.2)
Total Protein: 6.9 g/dL (ref 6.5–8.1)

## 2021-04-02 LAB — CBC WITH DIFFERENTIAL/PLATELET
Abs Immature Granulocytes: 0.03 10*3/uL (ref 0.00–0.07)
Basophils Absolute: 0 10*3/uL (ref 0.0–0.1)
Basophils Relative: 0 %
Eosinophils Absolute: 0.1 10*3/uL (ref 0.0–0.5)
Eosinophils Relative: 1 %
HCT: 36.2 % (ref 36.0–46.0)
Hemoglobin: 11.6 g/dL — ABNORMAL LOW (ref 12.0–15.0)
Immature Granulocytes: 0 %
Lymphocytes Relative: 3 %
Lymphs Abs: 0.3 10*3/uL — ABNORMAL LOW (ref 0.7–4.0)
MCH: 32.2 pg (ref 26.0–34.0)
MCHC: 32 g/dL (ref 30.0–36.0)
MCV: 100.6 fL — ABNORMAL HIGH (ref 80.0–100.0)
Monocytes Absolute: 0.3 10*3/uL (ref 0.1–1.0)
Monocytes Relative: 3 %
Neutro Abs: 8 10*3/uL — ABNORMAL HIGH (ref 1.7–7.7)
Neutrophils Relative %: 93 %
Platelets: 198 10*3/uL (ref 150–400)
RBC: 3.6 MIL/uL — ABNORMAL LOW (ref 3.87–5.11)
RDW: 16.4 % — ABNORMAL HIGH (ref 11.5–15.5)
WBC: 8.7 10*3/uL (ref 4.0–10.5)
nRBC: 0 % (ref 0.0–0.2)

## 2021-04-02 NOTE — Progress Notes (Signed)
Location:  [place] Room Number: 148-D Place of Service:  SNF (31)   CODE STATUS: DNR  Allergies  Allergen Reactions   Ace Inhibitors Other (See Comments) and Cough    Tongue swell , ie angioedema   Angiotensin Receptor Blockers     Angioedema with ACE-I   Penicillins Other (See Comments)    Unsure of reaction Has patient had a PCN reaction causing immediate rash, facial/tongue/throat swelling, SOB or lightheadedness with hypotension: Unknown Has patient had a PCN reaction causing severe rash involving mucus membranes or skin necrosis: Unknown Has patient had a PCN reaction that required hospitalization: No Has patient had a PCN reaction occurring within the last 10 years: Unknown If all of the above answers are "NO", then may proceed with Cephalosporin use.     Penicillin G     Chief Complaint  Patient presents with   Acute Visit    Nausea and vomiting     HPI:  She is having episodes of n/v. Today she has had diarrheal stool. She denies any pain; no nausea no vomiting. She is throwing food. There are no reports of fevers present. Today the staff report that there is som blood in her stool.   Past Medical History:  Diagnosis Date   Anemia     chronic macrocytic anemia   Anxiety    Chronic kidney disease    Chronic renal disease, stage 4, severely decreased glomerular filtration rate (GFR) between 15-29 mL/min/1.73 square meter ([place]) 54/62/7035   Complication of anesthesia    delirious after Breast Surgery   Dementia ([place])    mild   Depression    Diabetes mellitus with ESRD (end-stage renal disease) ([place])    type II   Dysphagia    Dyspnea    with activity   GERD (gastroesophageal reflux disease)    Glaucoma    Hyperlipidemia    Hypertension    Pneumonia    Stage 1 infiltrating ductal carcinoma of right female breast ([place]) 08/21/2015   ER+ PR+ HER 2 neu + (3+) T1cN0     Past Surgical History:  Procedure Laterality Date   A/V  FISTULAGRAM Right 07/05/2020   Procedure: A/V Fistulagram;  Surgeon: Elam Dutch, MD;  Location: [place] [facility] LAB;  Service: Cardiovascular;  Laterality: Right;   AV FISTULA PLACEMENT Left 11/22/2017   Procedure: ARTERIOVENOUS (AV) FISTULA CREATION LEFT ARM;  Surgeon: Elam Dutch, MD;  Location: [facility] OR;  Service: Vascular;  Laterality: Left;   AV FISTULA PLACEMENT Right 04/04/2020   Procedure: RIGHT ARM ARTERIOVENOUS FISTULA CREATION;  Surgeon: Rosetta Posner, MD;  Location: [facility];  Service: Vascular;  Laterality: Right;   AV FISTULA PLACEMENT Right 08/20/2020   Procedure: ARTERIOVENOUS (AV) FISTULA LIGATION RIGHT ARM;  Surgeon: Elam Dutch, MD;  Location: [facility] OR;  Service: Vascular;  Laterality: Right;   BIOPSY  08/07/2016   Procedure: BIOPSY;  Surgeon: Daneil Dolin, MD;  [facility];  Service: Endoscopy;;  gastric ulcer biopsy   COLONOSCOPY     ESOPHAGOGASTRODUODENOSCOPY N/A 08/07/2016   LA Grade A esophagitis s/p dilation, small hiatal hernia, multiple gastric ulcers and erosions, duodenal erosions s/p biopsy. Negative H.pylori    ESOPHAGOGASTRODUODENOSCOPY N/A 11/27/2016   normal esophagus, previously noted gastric ulcers completely healed, normal duodenum.    ESOPHAGOGASTRODUODENOSCOPY (EGD) WITH PROPOFOL N/A 07/23/2020   Procedure: ESOPHAGOGASTRODUODENOSCOPY (EGD) WITH PROPOFOL;  Surgeon: Rogene Houston, MD;  [facility];  Service:  Endoscopy;  Laterality: N/A;   FISTULA SUPERFICIALIZATION Left 02/14/2018   Procedure: FISTULA SUPERFICIALIZATION LEFT ARM;  Surgeon: Angelia Mould, MD;  Location: [facility] OR;  Service: Vascular;  Laterality: Left;   FRACTURE SURGERY Right    ankle   HOT HEMOSTASIS  07/23/2020   Procedure: HOT HEMOSTASIS (ARGON PLASMA COAGULATION/BICAP);  Surgeon: Rogene Houston, MD;  [facility];  Service: Endoscopy;;   INTRAMEDULLARY (IM) NAIL INTERTROCHANTERIC Right 07/12/2020   Procedure: INTRAMEDULLARY (IM) NAIL  INTERTROCHANTRIC;  Surgeon: Mordecai Rasmussen, MD;  Location: [facility];  Service: Orthopedics;  Laterality: Right;   MALONEY DILATION N/A 08/07/2016   Procedure: Venia Minks DILATION;  Surgeon: Daneil Dolin, MD;  [facility];  Service: Endoscopy;  Laterality: N/A;   MASTECTOMY, PARTIAL Right    multiple myeloma     PERIPHERAL VASCULAR BALLOON ANGIOPLASTY Left 07/13/2019   Procedure: PERIPHERAL VASCULAR BALLOON ANGIOPLASTY;  Surgeon: Marty Heck, MD;  Location: [place] [facility] LAB;  Service: Cardiovascular;  Laterality: Left;  arm fistulogram   PERIPHERAL VASCULAR BALLOON ANGIOPLASTY Right 05/22/2020   Procedure: PERIPHERAL VASCULAR BALLOON ANGIOPLASTY;  Surgeon: Cherre Robins, MD;  Location: [place] [facility] LAB;  Service: Cardiovascular;  Laterality: Right;  arm fistula   PERIPHERAL VASCULAR BALLOON ANGIOPLASTY Right 07/05/2020   Procedure: PERIPHERAL VASCULAR BALLOON ANGIOPLASTY;  Surgeon: Elam Dutch, MD;  Location: [place] [facility];  Service: Cardiovascular;  Laterality: Right;  arm fistula   PORT-A-CATH REMOVAL Left 11/22/2017   Procedure: REMOVAL PORT-A-CATH LEFT CHEST;  Surgeon: Elam Dutch, MD;  Location: [place];  Service: Vascular;  Laterality: Left;   RETINAL DETACHMENT SURGERY Right    SCLEROTHERAPY  07/23/2020   Procedure: SCLEROTHERAPY;  Surgeon: Rogene Houston, MD;  [facility];  Service: Endoscopy;;   UPPER EXTREMITY VENOGRAPHY N/A 08/22/2020   Procedure: LEFT UPPER & CENTRAL VENOGRAPHY;  Surgeon: Marty Heck, MD;  Location: [place] [facility];  Service: Cardiovascular;  Laterality: N/A;    Social History   Socioeconomic History   Marital status: Single    Spouse name: Not on file   Number of children: Not on file   Years of education: Not on file   Highest education level: Not on file  Occupational History   Occupation: retired   Tobacco Use   Smoking status: Never   Smokeless tobacco: Never  Vaping Use   Vaping Use: Never  used  Substance and Sexual Activity   Alcohol use: No    Alcohol/week: 0.0 standard drinks   Drug use: No   Sexual activity: Never  Other Topics Concern   Not on file  Social History Narrative   Long term resident of SNF    Social Determinants of Health   Financial Resource Strain: Not on file  Food Insecurity: Not on file  Transportation Needs: Not on file  Physical Activity: Not on file  Stress: Not on file  Social Connections: Not on file  Intimate Partner Violence: Not on file   Family History  Problem Relation Age of Onset   Multiple myeloma Sister    Brain cancer Sister    Dementia Mother        died at 65   Stroke Mother    Heart failure Mother    Diabetes Mother    Heart disease Father    Prostate cancer Brother    Colon cancer Neg Hx       VITAL SIGNS BP 128/68  Pulse 81    Temp 97.7 F (36.5 C)    Ht 5' 3" (1.6 m)    Wt 139 lb 6.4 oz (63.2 kg)    BMI 24.69 kg/m   Outpatient Encounter Medications as of 04/02/2021  Medication Sig   acetaminophen (TYLENOL) 325 MG tablet Take 650 mg by mouth every 8 (eight) hours.   acyclovir (ZOVIRAX) 200 MG capsule Take 200 mg by mouth in the morning. (0800)   Amino Acids-Protein Hydrolys (FEEDING SUPPLEMENT, PRO-STAT SUGAR FREE 64,) LIQD Take 30 mLs by mouth 3 (three) times daily with meals. (0800, 1200 & 1800)   ammonium lactate (AMLACTIN) 12 % cream Apply 1 application topically at bedtime.   anastrozole (ARIMIDEX) 1 MG tablet TAKE 1 TABLET BY MOUTH DAILY   atenolol (TENORMIN) 25 MG tablet Take 1 tablet (25 mg total) by mouth in the morning. (0800)   atorvastatin (LIPITOR) 10 MG tablet Take 10 mg by mouth at bedtime. (2000)   Balsam [place]-Castor Oil (VENELEX) OINT Apply topically. Apply to bilateral buttocks and sacrum every shift.   Calcium Acetate 667 MG TABS Take 667-1,334 mg by mouth See admin instructions. Take 2 tablets (1334 mg) by mouth with each meal (0800 &1730) and take 1 tablet (667 mg) by mouth with each  snack (2000)   calcium-vitamin D (OSCAL WITH D) 500-200 MG-UNIT tablet Take 1 tablet by mouth in the morning. (0800)   cloNIDine (CATAPRES) 0.1 MG tablet Take 1 tablet (0.1 mg total) by mouth at bedtime. (2000) Hold for SBP < 110 bpm   insulin glargine (LANTUS SOLOSTAR) 100 UNIT/ML Solostar Pen Inject 5 Units into the skin at bedtime. (2000)   melatonin 3 MG TABS tablet Take 6 mg by mouth at bedtime. (2000) For Sleep   multivitamin (RENA-VIT) TABS tablet Take 1 tablet by mouth at bedtime. (2100)   NON FORMULARY Diet-Clear liquid diet x 24 hours due to nausea and vomiting   ondansetron (ZOFRAN) 4 MG tablet Take 1 tablet (4 mg total) by mouth every 6 (six) hours as needed for nausea.   pantoprazole (PROTONIX) 40 MG tablet Take 40 mg by mouth 2 (two) times daily.   sertraline (ZOLOFT) 25 MG tablet Take 25 mg by mouth daily. At 9 am along with 50 mg tablet for a total of 75 mg daily   sertraline (ZOLOFT) 50 MG tablet Take 50 mg by mouth daily. At 9 am along with 25 mg tablet for a total of 75 mg daily   sucralfate (CARAFATE) 1 GM/10ML suspension Take 1 g by mouth in the morning, at noon, in the evening, and at bedtime. (0800, 1200, 1700 & 2100)   No facility-administered encounter medications on file as of 04/02/2021.     SIGNIFICANT DIAGNOSTIC EXAMS  PREVIOUS   07-11-20: chest x-ray:  Central catheter tip at cavoatrial junction. No pneumothorax. Mild left base atelectasis. No edema or airspace opacity. Stable cardiac silhouette. Aortic Atherosclerosis   07-11-20: right knee x-ray:  No acute fracture or dislocation is noted. Diffuse vascular calcifications are seen. No soft tissue abnormality is noted.  07-11-20: right hip x-ray:  Acute, impacted, angulated right basicervical femoral neck fracture.   07-23-20: upper endoscopy:  Normal hypopharynx. - Normal esophagus. - Z-line irregular, 34 cm from the incisors. - Non-bleeding gastric ulcers with no stigmata of bleeding. - Duodenal ulcer with  stigmata of bleed/ pigmented material. Gold probe therapy was not successful and hemostasis achieved with injection of dilute epinephrine. - Non-bleeding duodenal ulcer with no stigmata of  bleeding(second bulbar ulcer) - Normal second portion of the duodenum. - No specimens collected.  07-23-20: chest x-ray:No active disease. Aortic Atherosclerosis    NO NEW EXAMS.   LABS REVIEWED PREVIOUS  03-26-20: wbc 6.3; hgb 9.4; hct 30.4; mcv 104.1; plt 300; glucose 223; bun 101; creat 14.22; k+ 5.8; na++ 135; ca 8.1 GFR 2; liver normal albumin 3.4  05-21-20; wbc 5.1; hgb 10.4; hct 32.8; mcv 106.8 plt 303; glucose 186; bun 41; creat 1.54; k+ 5.0; na++ 132; ca 8.4 GFR 5; liver normal albumin 3.7 06-13-20: chol 189; ldl 106; trig 211; hdl 41; hgb a1c 6.2   07-11-20: wbc 9.1; hgb 10.6; hct 34.6; mcv 109.1 plt 248; glucose 79; bun 45; creat 7.71 k+ 4.9; na++ 134; ca 8.1 GFR 5; liver normal albumin 3.5 mag 2.4 07-13-20: wbc 11.4; hgb 8.9; hct 29.7; mcv 108.8 plt 303; glucose 178; bun 66; creat 11.17; k+ 6.6; na++ 135; ca 8.2; GFR 3 07-15-20: wbc 11.0; hgb 7.5; hct 25.2; mcv 108.2 plt 251; glucose 117; bun 60; creat 9.41; k+ 5.5; na++ 138; ca 8.4 GFR 4 07-17-20: wbc 11.5; hgb 9.4; hct 29.8; mcv 100.3 plt 300; glucose 140; bun 68; creat 8.04; k+ 4.2; na++ 135; ca 8.5 GFR 5 07-22-20: wbc 11.6; hgb 7.1; hct 23.5; mcv 104.0 plt 444; glucose 214; bun 97; creat 9.18; k+ 4.5; na++ 140; ca 8.0; GFR4 07-24-20: wbc 14.3; hgb 6.7; hct 21.2; mcv 99.5 plt 385; glucose 96; bun 49; creat 6.55; k+ 4.7; na++ 133; ca 8.0; GFR4; mag 2.0 phos 3.8 07-26-20: wbc 10.4; hgb 8.5; hct 26.8; mcv 98.9 plt 430; glucose 111; bun 40;creat 6.90; k+ 4.2; na++ 133; ca 8.5; GFR 6; mag 2.2; phos 3.3    07-30-20: wbc 7.6; hgb 7.3; hct 24.1; mcv 103.4 plt 345; glucose 97; bun 26; creat 5.89; k+ 4.1; na++ 139; ca 8.6; GFR 7; liver normal albumin 2.8.  09-03-20: wbc 5.8; hgb 11.2; hct 37.3; mcv 102.8 plt 274; glucose 175; bun 38; creat 5.72; k+ 3.1; na++ 138; ca 9.2  GFR 7 alk phos 138; albumin 3.4 mag 2.0 09-30-20: wbc 5.6; hgb 13.1; hct 42.6; mcv 97.9 plt 260; glucose 92; bun 23; creat 4.18; k+ 3.1; na++ 134; ca 8.7; GFR 10; liver normal albumin 3.8 10-29-20: wbc 5.3; hgb 12.1; hct 40.0; mcv 96.6 plt 218; glucose 190; bun 54; creat 7.35 k+ 4.0; na++ 134; ca 9.4; GFR 5 liver normal albumin 3.7; mag 2.3 11-19-20: wbc 10.9; hgb 11.3; hct 37.2; mcv 96.6 plt 242; glucose 106; bun 47; creat 6.58; k+ 3.9; na++ 133; ca 9.0; GFR 6 liver normal albumin 3.5  12-02-20: hgb a1c 5.6; chol 167; ldl 83; trig 229; hdl 38 12-03-20; wbc 5.2; hgb 11.1; hct 35.7; mcv 96.5 plt 320; glucose 139; bun 47; creat 6.05; k+ 4.1; na++ 134; ca 9.1; GFR 6; liver normal albumin 3.8 mag 2.4  12-17-20: wbc 5.2; hgb 10.5; hct 33.8; mcv 98.8 plt 256; glucose 136; bun 43; creat 6.19; k+ 3.6; na++ 137; ca 8.9; GFR 6 liver normal albumin 3.9  03-06-21: wbc 4.7; hgb 12.3; hct 38.2; mcv 101.6 plt 221; glucose 205; bun 51; creat 7.06; k+ 4.5; na++ 133; ca 9.1; GFR 5; liver normal albumin 3.8  03-20-21: wbc 5.4; hgb 12.1; hct 37.7; mcv 97.2 plt 254; glucose 111; bun 47; creat 7.34; k+ 4.6; na++ 131; ca 9.6 GFR 5; liver normal albumin 3.9; mag 2.2  NO NEW LABS.    Review of Systems  Constitutional:  Negative for malaise/fatigue.  Respiratory:  Negative for cough and shortness of breath.   Cardiovascular:  Negative for chest pain, palpitations and leg swelling.  Gastrointestinal:  Positive for diarrhea, nausea and vomiting. Negative for abdominal pain, constipation and heartburn.  Musculoskeletal:  Negative for back pain, joint pain and myalgias.  Skin: Negative.   Neurological:  Negative for dizziness.  Psychiatric/Behavioral:  The patient is not nervous/anxious.    Physical Exam Constitutional:      General: She is not in acute distress.    Appearance: She is well-developed. She is not diaphoretic.  Neck:     Thyroid: No thyromegaly.  Cardiovascular:     Rate and Rhythm: Normal rate and regular  rhythm.     Pulses: Normal pulses.     Heart sounds: Normal heart sounds.  Pulmonary:     Effort: Pulmonary effort is normal. No respiratory distress.     Breath sounds: Normal breath sounds.  Abdominal:     General: Bowel sounds are normal. There is no distension.     Palpations: Abdomen is soft.     Tenderness: There is no abdominal tenderness.  Musculoskeletal:     Cervical back: Neck supple.     Right lower leg: No edema.     Left lower leg: No edema.     Comments: Right hip IM nail 07-12-20    Is able to move all extremities   Lymphadenopathy:     Cervical: No cervical adenopathy.  Skin:    General: Skin is warm and dry.     Comments: Dialysis access on chest wall   Neurological:     Mental Status: She is alert. Mental status is at baseline.  Psychiatric:        Mood and Affect: Mood normal.      ASSESSMENT/ PLAN:  TODAY  Nausea/vomiting/diarrhea  Will put on clear liquids for 24 hours Will check kub; cbc; ccmp stool for c-diff    Ok Edwards NP [facility]  call [phone]

## 2021-04-03 ENCOUNTER — Ambulatory Visit (HOSPITAL_COMMUNITY): Payer: Medicare PPO

## 2021-04-03 ENCOUNTER — Encounter: Payer: Self-pay | Admitting: Adult Health

## 2021-04-03 ENCOUNTER — Other Ambulatory Visit (HOSPITAL_COMMUNITY): Payer: Medicare PPO

## 2021-04-03 ENCOUNTER — Non-Acute Institutional Stay (SKILLED_NURSING_FACILITY): Payer: Medicare PPO | Admitting: Adult Health

## 2021-04-03 DIAGNOSIS — I70209 Unspecified atherosclerosis of native arteries of extremities, unspecified extremity: Secondary | ICD-10-CM

## 2021-04-03 DIAGNOSIS — Z992 Dependence on renal dialysis: Secondary | ICD-10-CM | POA: Diagnosis not present

## 2021-04-03 DIAGNOSIS — C9000 Multiple myeloma not having achieved remission: Secondary | ICD-10-CM | POA: Diagnosis not present

## 2021-04-03 DIAGNOSIS — N186 End stage renal disease: Secondary | ICD-10-CM

## 2021-04-03 DIAGNOSIS — E1122 Type 2 diabetes mellitus with diabetic chronic kidney disease: Secondary | ICD-10-CM

## 2021-04-03 DIAGNOSIS — I120 Hypertensive chronic kidney disease with stage 5 chronic kidney disease or end stage renal disease: Secondary | ICD-10-CM | POA: Diagnosis not present

## 2021-04-03 DIAGNOSIS — Z1159 Encounter for screening for other viral diseases: Secondary | ICD-10-CM | POA: Diagnosis not present

## 2021-04-03 NOTE — Progress Notes (Signed)
Location:  [facility] Room Number: 148 Place of Service:  SNF (31) Provider: Ok Edwards, NP   CODE STATUS: DNR  Allergies  Allergen Reactions   Ace Inhibitors Other (See Comments) and Cough    Tongue swell , ie angioedema   Angiotensin Receptor Blockers     Angioedema with ACE-I   Penicillins Other (See Comments)    Unsure of reaction Has patient had a PCN reaction causing immediate rash, facial/tongue/throat swelling, SOB or lightheadedness with hypotension: Unknown Has patient had a PCN reaction causing severe rash involving mucus membranes or skin necrosis: Unknown Has patient had a PCN reaction that required hospitalization: No Has patient had a PCN reaction occurring within the last 10 years: Unknown If all of the above answers are "NO", then may proceed with Cephalosporin use.     Penicillin G     Chief Complaint  Patient presents with   Acute Visit    Care plan meeting    HPI:  We have come together for her care plan meeting. BIMS 11/15  mood 3/30: very tired. She requires limited to extensive assist with her adl care. She is frequently incontinent of bladder and bowel. She has had one fall without injury. Dietary: weight is 139.4 pounds feeds self weight stable; has a good appetite. Therapy: none at this time. She continues to be followed for her chronic illnesses including: Type 2 diabetes mellitus with hypertension and end stage renal disease on dialysis Chronic kidney disease with end stage renal disease on dialysis due to type 2 diabetes mellitus Atherosclerotic peripheral vascular disease  Dependence on renal dialysis   Past Medical History:  Diagnosis Date   Anemia     chronic macrocytic anemia   Anxiety    Chronic kidney disease    Chronic renal disease, stage 4, severely decreased glomerular filtration rate (GFR) between 15-29 mL/min/1.73 square meter (HCC) 40/12/2723   Complication of anesthesia    delirious after Breast Surgery    Dementia ([place])    mild   Depression    Diabetes mellitus with ESRD (end-stage renal disease) ([place])    type II   Dysphagia    Dyspnea    with activity   GERD (gastroesophageal reflux disease)    Glaucoma    Hyperlipidemia    Hypertension    Pneumonia    Stage 1 infiltrating ductal carcinoma of right female breast ([place]) 08/21/2015   ER+ PR+ HER 2 neu + (3+) T1cN0     Past Surgical History:  Procedure Laterality Date   A/V FISTULAGRAM Right 07/05/2020   Procedure: A/V Fistulagram;  Surgeon: Elam Dutch, MD;  Location: [facility];  Service: Cardiovascular;  Laterality: Right;   AV FISTULA PLACEMENT Left 11/22/2017   Procedure: ARTERIOVENOUS (AV) FISTULA CREATION LEFT ARM;  Surgeon: Elam Dutch, MD;  Location: [facility];  Service: Vascular;  Laterality: Left;   AV FISTULA PLACEMENT Right 04/04/2020   Procedure: RIGHT ARM ARTERIOVENOUS FISTULA CREATION;  Surgeon: Rosetta Posner, MD;  Location: [facility];  Service: Vascular;  Laterality: Right;   AV FISTULA PLACEMENT Right 08/20/2020   Procedure: ARTERIOVENOUS (AV) FISTULA LIGATION RIGHT ARM;  Surgeon: Elam Dutch, MD;  Location: [facility];  Service: Vascular;  Laterality: Right;   BIOPSY  08/07/2016   Procedure: BIOPSY;  Surgeon: Daneil Dolin, MD;  [facility];  Service: Endoscopy;;  gastric ulcer biopsy   COLONOSCOPY     ESOPHAGOGASTRODUODENOSCOPY N/A 08/07/2016   LA  Grade A esophagitis s/p dilation, small hiatal hernia, multiple gastric ulcers and erosions, duodenal erosions s/p biopsy. Negative H.pylori    ESOPHAGOGASTRODUODENOSCOPY N/A 11/27/2016   normal esophagus, previously noted gastric ulcers completely healed, normal duodenum.    ESOPHAGOGASTRODUODENOSCOPY (EGD) WITH PROPOFOL N/A 07/23/2020   Procedure: ESOPHAGOGASTRODUODENOSCOPY (EGD) WITH PROPOFOL;  Surgeon: Rogene Houston, MD;  [facility];  Service: Endoscopy;  Laterality: N/A;   FISTULA SUPERFICIALIZATION Left 02/14/2018   Procedure:  FISTULA SUPERFICIALIZATION LEFT ARM;  Surgeon: Angelia Mould, MD;  Location: [facility] OR;  Service: Vascular;  Laterality: Left;   FRACTURE SURGERY Right    ankle   HOT HEMOSTASIS  07/23/2020   Procedure: HOT HEMOSTASIS (ARGON PLASMA COAGULATION/BICAP);  Surgeon: Rogene Houston, MD;  [facility];  Service: Endoscopy;;   INTRAMEDULLARY (IM) NAIL INTERTROCHANTERIC Right 07/12/2020   Procedure: INTRAMEDULLARY (IM) NAIL INTERTROCHANTRIC;  Surgeon: Mordecai Rasmussen, MD;  Location: [facility];  Service: Orthopedics;  Laterality: Right;   MALONEY DILATION N/A 08/07/2016   Procedure: Venia Minks DILATION;  Surgeon: Daneil Dolin, MD;  [facility];  Service: Endoscopy;  Laterality: N/A;   MASTECTOMY, PARTIAL Right    multiple myeloma     PERIPHERAL VASCULAR BALLOON ANGIOPLASTY Left 07/13/2019   Procedure: PERIPHERAL VASCULAR BALLOON ANGIOPLASTY;  Surgeon: Marty Heck, MD;  Location: [place] [facility];  Service: Cardiovascular;  Laterality: Left;  arm fistulogram   PERIPHERAL VASCULAR BALLOON ANGIOPLASTY Right 05/22/2020   Procedure: PERIPHERAL VASCULAR BALLOON ANGIOPLASTY;  Surgeon: Cherre Robins, MD;  Location: [facility];  Service: Cardiovascular;  Laterality: Right;  arm fistula   PERIPHERAL VASCULAR BALLOON ANGIOPLASTY Right 07/05/2020   Procedure: PERIPHERAL VASCULAR BALLOON ANGIOPLASTY;  Surgeon: Elam Dutch, MD;  Location: [facility];  Service: Cardiovascular;  Laterality: Right;  arm fistula   PORT-A-CATH REMOVAL Left 11/22/2017   Procedure: REMOVAL PORT-A-CATH LEFT CHEST;  Surgeon: Elam Dutch, MD;  Location: [facility];  Service: Vascular;  Laterality: Left;   RETINAL DETACHMENT SURGERY Right    SCLEROTHERAPY  07/23/2020   Procedure: SCLEROTHERAPY;  Surgeon: Rogene Houston, MD;  [facility];  Service: Endoscopy;;   UPPER EXTREMITY VENOGRAPHY N/A 08/22/2020   Procedure: LEFT UPPER & CENTRAL VENOGRAPHY;  Surgeon: Marty Heck,  MD;  Location: [facility];  Service: Cardiovascular;  Laterality: N/A;    Social History   Socioeconomic History   Marital status: Single    Spouse name: Not on file   Number of children: Not on file   Years of education: Not on file   Highest education level: Not on file  Occupational History   Occupation: retired   Tobacco Use   Smoking status: Never   Smokeless tobacco: Never  Vaping Use   Vaping Use: Never used  Substance and Sexual Activity   Alcohol use: No    Alcohol/week: 0.0 standard drinks   Drug use: No   Sexual activity: Never  Other Topics Concern   Not on file  Social History Narrative   Long term resident of SNF    Social Determinants of Health   Financial Resource Strain: Not on file  Food Insecurity: Not on file  Transportation Needs: Not on file  Physical Activity: Not on file  Stress: Not on file  Social Connections: Not on file  Intimate Partner Violence: Not on file   Family History  Problem Relation Age of Onset   Multiple myeloma Sister  Brain cancer Sister    Dementia Mother        died at 40   Stroke Mother    Heart failure Mother    Diabetes Mother    Heart disease Father    Prostate cancer Brother    Colon cancer Neg Hx       VITAL SIGNS BP 134/61    Pulse 81    Temp 98 F (36.7 C)    Resp 16    Ht '5\' 3"'  (1.6 m)    Wt 139 lb 6.4 oz (63.2 kg)    SpO2 98%    BMI 24.69 kg/m   Outpatient Encounter Medications as of 04/03/2021  Medication Sig   acetaminophen (TYLENOL) 325 MG tablet Take 650 mg by mouth every 8 (eight) hours.   acyclovir (ZOVIRAX) 200 MG capsule Take 200 mg by mouth in the morning. (0800)   Amino Acids-Protein Hydrolys (FEEDING SUPPLEMENT, PRO-STAT SUGAR FREE 64,) LIQD Take 30 mLs by mouth 3 (three) times daily with meals. (0800, 1200 & 1800)   ammonium lactate (AMLACTIN) 12 % cream Apply 1 application topically at bedtime.   anastrozole (ARIMIDEX) 1 MG tablet TAKE 1 TABLET BY MOUTH DAILY   atenolol  (TENORMIN) 25 MG tablet Take 1 tablet (25 mg total) by mouth in the morning. (0800)   atorvastatin (LIPITOR) 10 MG tablet Take 10 mg by mouth at bedtime. (2000)   Balsam [place]-Castor Oil (VENELEX) OINT Apply topically. Apply to bilateral buttocks and sacrum every shift.   Calcium Acetate 667 MG TABS Take 667-1,334 mg by mouth See admin instructions. Take 2 tablets (1334 mg) by mouth with each meal (0800 &1730) and take 1 tablet (667 mg) by mouth with each snack (2000)   calcium-vitamin D (OSCAL WITH D) 500-200 MG-UNIT tablet Take 1 tablet by mouth in the morning. (0800)   cloNIDine (CATAPRES) 0.1 MG tablet Take 1 tablet (0.1 mg total) by mouth at bedtime. (2000) Hold for SBP < 110 bpm   insulin glargine (LANTUS SOLOSTAR) 100 UNIT/ML Solostar Pen Inject 5 Units into the skin at bedtime. (2000)   melatonin 3 MG TABS tablet Take 6 mg by mouth at bedtime. (2000) For Sleep   multivitamin (RENA-VIT) TABS tablet Take 1 tablet by mouth at bedtime. (2100)   NON FORMULARY Diet-Clear liquid diet x 24 hours due to nausea and vomiting   ondansetron (ZOFRAN) 4 MG tablet Take 1 tablet (4 mg total) by mouth every 6 (six) hours as needed for nausea.   pantoprazole (PROTONIX) 40 MG tablet Take 40 mg by mouth 2 (two) times daily.   sertraline (ZOLOFT) 25 MG tablet Take 25 mg by mouth daily. At 9 am along with 50 mg tablet for a total of 75 mg daily   sertraline (ZOLOFT) 50 MG tablet Take 50 mg by mouth daily. At 9 am along with 25 mg tablet for a total of 75 mg daily   sucralfate (CARAFATE) 1 GM/10ML suspension Take 1 g by mouth in the morning, at noon, in the evening, and at bedtime. (0800, 1200, 1700 & 2100)   No facility-administered encounter medications on file as of 04/03/2021.     SIGNIFICANT DIAGNOSTIC EXAMS  PREVIOUS   07-11-20: chest x-ray:  Central catheter tip at cavoatrial junction. No pneumothorax. Mild left base atelectasis. No edema or airspace opacity. Stable cardiac silhouette. Aortic  Atherosclerosis   07-11-20: right knee x-ray:  No acute fracture or dislocation is noted. Diffuse vascular calcifications are seen. No soft tissue abnormality  is noted.  07-11-20: right hip x-ray:  Acute, impacted, angulated right basicervical femoral neck fracture.   07-23-20: upper endoscopy:  Normal hypopharynx. - Normal esophagus. - Z-line irregular, 34 cm from the incisors. - Non-bleeding gastric ulcers with no stigmata of bleeding. - Duodenal ulcer with stigmata of bleed/ pigmented material. Gold probe therapy was not successful and hemostasis achieved with injection of dilute epinephrine. - Non-bleeding duodenal ulcer with no stigmata of bleeding(second bulbar ulcer) - Normal second portion of the duodenum. - No specimens collected.  07-23-20: chest x-ray:No active disease. Aortic Atherosclerosis    NO NEW EXAMS.   LABS REVIEWED PREVIOUS  03-26-20: wbc 6.3; hgb 9.4; hct 30.4; mcv 104.1; plt 300; glucose 223; bun 101; creat 14.22; k+ 5.8; na++ 135; ca 8.1 GFR 2; liver normal albumin 3.4  05-21-20; wbc 5.1; hgb 10.4; hct 32.8; mcv 106.8 plt 303; glucose 186; bun 41; creat 1.54; k+ 5.0; na++ 132; ca 8.4 GFR 5; liver normal albumin 3.7 06-13-20: chol 189; ldl 106; trig 211; hdl 41; hgb a1c 6.2   07-11-20: wbc 9.1; hgb 10.6; hct 34.6; mcv 109.1 plt 248; glucose 79; bun 45; creat 7.71 k+ 4.9; na++ 134; ca 8.1 GFR 5; liver normal albumin 3.5 mag 2.4 07-13-20: wbc 11.4; hgb 8.9; hct 29.7; mcv 108.8 plt 303; glucose 178; bun 66; creat 11.17; k+ 6.6; na++ 135; ca 8.2; GFR 3 07-15-20: wbc 11.0; hgb 7.5; hct 25.2; mcv 108.2 plt 251; glucose 117; bun 60; creat 9.41; k+ 5.5; na++ 138; ca 8.4 GFR 4 07-17-20: wbc 11.5; hgb 9.4; hct 29.8; mcv 100.3 plt 300; glucose 140; bun 68; creat 8.04; k+ 4.2; na++ 135; ca 8.5 GFR 5 07-22-20: wbc 11.6; hgb 7.1; hct 23.5; mcv 104.0 plt 444; glucose 214; bun 97; creat 9.18; k+ 4.5; na++ 140; ca 8.0; GFR4 07-24-20: wbc 14.3; hgb 6.7; hct 21.2; mcv 99.5 plt 385; glucose 96; bun  49; creat 6.55; k+ 4.7; na++ 133; ca 8.0; GFR4; mag 2.0 phos 3.8 07-26-20: wbc 10.4; hgb 8.5; hct 26.8; mcv 98.9 plt 430; glucose 111; bun 40;creat 6.90; k+ 4.2; na++ 133; ca 8.5; GFR 6; mag 2.2; phos 3.3    07-30-20: wbc 7.6; hgb 7.3; hct 24.1; mcv 103.4 plt 345; glucose 97; bun 26; creat 5.89; k+ 4.1; na++ 139; ca 8.6; GFR 7; liver normal albumin 2.8.  09-03-20: wbc 5.8; hgb 11.2; hct 37.3; mcv 102.8 plt 274; glucose 175; bun 38; creat 5.72; k+ 3.1; na++ 138; ca 9.2 GFR 7 alk phos 138; albumin 3.4 mag 2.0 09-30-20: wbc 5.6; hgb 13.1; hct 42.6; mcv 97.9 plt 260; glucose 92; bun 23; creat 4.18; k+ 3.1; na++ 134; ca 8.7; GFR 10; liver normal albumin 3.8 10-29-20: wbc 5.3; hgb 12.1; hct 40.0; mcv 96.6 plt 218; glucose 190; bun 54; creat 7.35 k+ 4.0; na++ 134; ca 9.4; GFR 5 liver normal albumin 3.7; mag 2.3 11-19-20: wbc 10.9; hgb 11.3; hct 37.2; mcv 96.6 plt 242; glucose 106; bun 47; creat 6.58; k+ 3.9; na++ 133; ca 9.0; GFR 6 liver normal albumin 3.5  12-02-20: hgb a1c 5.6; chol 167; ldl 83; trig 229; hdl 38 12-03-20; wbc 5.2; hgb 11.1; hct 35.7; mcv 96.5 plt 320; glucose 139; bun 47; creat 6.05; k+ 4.1; na++ 134; ca 9.1; GFR 6; liver normal albumin 3.8 mag 2.4  12-17-20: wbc 5.2; hgb 10.5; hct 33.8; mcv 98.8 plt 256; glucose 136; bun 43; creat 6.19; k+ 3.6; na++ 137; ca 8.9; GFR 6 liver normal albumin 3.9  03-06-21: wbc  4.7; hgb 12.3; hct 38.2; mcv 101.6 plt 221; glucose 205; bun 51; creat 7.06; k+ 4.5; na++ 133; ca 9.1; GFR 5; liver normal albumin 3.8  03-20-21: wbc 5.4; hgb 12.1; hct 37.7; mcv 97.2 plt 254; glucose 111; bun 47; creat 7.34; k+ 4.6; na++ 131; ca 9.6 GFR 5; liver normal albumin 3.9; mag 2.2  NO NEW LABS.     Review of Systems  Constitutional:  Negative for malaise/fatigue.  Respiratory:  Negative for cough and shortness of breath.   Cardiovascular:  Negative for chest pain, palpitations and leg swelling.  Gastrointestinal:  Negative for abdominal pain, constipation and heartburn.   Musculoskeletal:  Negative for back pain, joint pain and myalgias.  Skin: Negative.   Neurological:  Negative for dizziness.  Psychiatric/Behavioral:  The patient is not nervous/anxious.    Physical Exam Constitutional:      General: She is not in acute distress.    Appearance: She is well-developed. She is not diaphoretic.  Neck:     Thyroid: No thyromegaly.  Cardiovascular:     Rate and Rhythm: Normal rate and regular rhythm.     Pulses: Normal pulses.     Heart sounds: Normal heart sounds.  Pulmonary:     Effort: Pulmonary effort is normal. No respiratory distress.     Breath sounds: Normal breath sounds.  Abdominal:     General: Bowel sounds are normal. There is no distension.     Palpations: Abdomen is soft.     Tenderness: There is no abdominal tenderness.  Musculoskeletal:     Cervical back: Neck supple.     Right lower leg: No edema.     Left lower leg: No edema.     Comments: Right hip IM nail 07-12-20    Is able to move all extremities  Lymphadenopathy:     Cervical: No cervical adenopathy.  Skin:    General: Skin is warm and dry.     Comments: Dialysis access on chest wall  Neurological:     Mental Status: She is alert. Mental status is at baseline.  Psychiatric:        Mood and Affect: Mood normal.     ASSESSMENT/ PLAN:  TODAY  Type 2 diabetes mellitus with hypertension and end stage renal disease on dialysis Chronic kidney disease with end stage renal disease on dialysis due to type 2 diabetes mellitus Atherosclerotic peripheral vascular disease Dependence on renal dialysis   Will continue current medications Will continue current plan of care Will continue to monitor her status.    Time spent with patient: 40 minutes: medications; plan of care.    Ok Edwards NP [facility]   call [phone]

## 2021-04-03 NOTE — Progress Notes (Signed)
This encounter was created in error - please disregard.

## 2021-04-04 ENCOUNTER — Ambulatory Visit (HOSPITAL_COMMUNITY): Payer: Medicare PPO

## 2021-04-04 ENCOUNTER — Other Ambulatory Visit (HOSPITAL_COMMUNITY): Payer: Medicare PPO

## 2021-04-04 DIAGNOSIS — N186 End stage renal disease: Secondary | ICD-10-CM | POA: Diagnosis not present

## 2021-04-04 DIAGNOSIS — N2581 Secondary hyperparathyroidism of renal origin: Secondary | ICD-10-CM | POA: Diagnosis not present

## 2021-04-04 DIAGNOSIS — Z992 Dependence on renal dialysis: Secondary | ICD-10-CM | POA: Diagnosis not present

## 2021-04-07 DIAGNOSIS — N2581 Secondary hyperparathyroidism of renal origin: Secondary | ICD-10-CM | POA: Diagnosis not present

## 2021-04-07 DIAGNOSIS — N186 End stage renal disease: Secondary | ICD-10-CM | POA: Diagnosis not present

## 2021-04-07 DIAGNOSIS — Z992 Dependence on renal dialysis: Secondary | ICD-10-CM | POA: Diagnosis not present

## 2021-04-08 ENCOUNTER — Ambulatory Visit (HOSPITAL_COMMUNITY)
Admission: RE | Admit: 2021-04-08 | Discharge: 2021-04-08 | Disposition: A | Discharge status: Home / Self Care | Payer: Medicare PPO | Source: Ambulatory Visit | Attending: Adult Health | Admitting: Adult Health

## 2021-04-08 ENCOUNTER — Other Ambulatory Visit: Payer: Self-pay

## 2021-04-08 ENCOUNTER — Inpatient Hospital Stay (HOSPITAL_COMMUNITY): Payer: Medicare PPO

## 2021-04-08 VITALS — BP 137/67 | HR 63 | Temp 96.7°F | Resp 20 | Ht 63.0 in | Wt 125.8 lb

## 2021-04-08 DIAGNOSIS — C50911 Malignant neoplasm of unspecified site of right female breast: Secondary | ICD-10-CM | POA: Diagnosis not present

## 2021-04-08 DIAGNOSIS — M619 Calcification and ossification of muscle, unspecified: Secondary | ICD-10-CM | POA: Diagnosis not present

## 2021-04-08 DIAGNOSIS — C9000 Multiple myeloma not having achieved remission: Secondary | ICD-10-CM

## 2021-04-08 DIAGNOSIS — Z992 Dependence on renal dialysis: Secondary | ICD-10-CM | POA: Diagnosis not present

## 2021-04-08 DIAGNOSIS — R112 Nausea with vomiting, unspecified: Secondary | ICD-10-CM | POA: Diagnosis not present

## 2021-04-08 DIAGNOSIS — Z170 Estrogen receptor positive status [ER+]: Secondary | ICD-10-CM | POA: Diagnosis not present

## 2021-04-08 DIAGNOSIS — Z79899 Other long term (current) drug therapy: Secondary | ICD-10-CM | POA: Diagnosis not present

## 2021-04-08 DIAGNOSIS — N186 End stage renal disease: Secondary | ICD-10-CM | POA: Diagnosis not present

## 2021-04-08 DIAGNOSIS — Z79811 Long term (current) use of aromatase inhibitors: Secondary | ICD-10-CM | POA: Diagnosis not present

## 2021-04-08 DIAGNOSIS — K8020 Calculus of gallbladder without cholecystitis without obstruction: Secondary | ICD-10-CM | POA: Diagnosis not present

## 2021-04-08 DIAGNOSIS — Z5112 Encounter for antineoplastic immunotherapy: Secondary | ICD-10-CM | POA: Diagnosis not present

## 2021-04-08 LAB — CBC WITH DIFFERENTIAL/PLATELET
Abs Immature Granulocytes: 0.01 10*3/uL (ref 0.00–0.07)
Basophils Absolute: 0 10*3/uL (ref 0.0–0.1)
Basophils Relative: 1 %
Eosinophils Absolute: 0.2 10*3/uL (ref 0.0–0.5)
Eosinophils Relative: 4 %
HCT: 33.5 % — ABNORMAL LOW (ref 36.0–46.0)
Hemoglobin: 10.8 g/dL — ABNORMAL LOW (ref 12.0–15.0)
Immature Granulocytes: 0 %
Lymphocytes Relative: 32 %
Lymphs Abs: 1.5 10*3/uL (ref 0.7–4.0)
MCH: 31.8 pg (ref 26.0–34.0)
MCHC: 32.2 g/dL (ref 30.0–36.0)
MCV: 98.5 fL (ref 80.0–100.0)
Monocytes Absolute: 0.5 10*3/uL (ref 0.1–1.0)
Monocytes Relative: 10 %
Neutro Abs: 2.5 10*3/uL (ref 1.7–7.7)
Neutrophils Relative %: 53 %
Platelets: 246 10*3/uL (ref 150–400)
RBC: 3.4 MIL/uL — ABNORMAL LOW (ref 3.87–5.11)
RDW: 15.7 % — ABNORMAL HIGH (ref 11.5–15.5)
WBC: 4.7 10*3/uL (ref 4.0–10.5)
nRBC: 0 % (ref 0.0–0.2)

## 2021-04-08 LAB — COMPREHENSIVE METABOLIC PANEL
ALT: 14 U/L (ref 0–44)
AST: 22 U/L (ref 15–41)
Albumin: 3.7 g/dL (ref 3.5–5.0)
Alkaline Phosphatase: 66 U/L (ref 38–126)
Anion gap: 12 (ref 5–15)
BUN: 39 mg/dL — ABNORMAL HIGH (ref 8–23)
CO2: 28 mmol/L (ref 22–32)
Calcium: 9 mg/dL (ref 8.9–10.3)
Chloride: 96 mmol/L — ABNORMAL LOW (ref 98–111)
Creatinine, Ser: 7.39 mg/dL — ABNORMAL HIGH (ref 0.44–1.00)
GFR, Estimated: 5 mL/min — ABNORMAL LOW (ref >60)
Glucose, Bld: 96 mg/dL (ref 70–99)
Potassium: 4.5 mmol/L (ref 3.5–5.1)
Sodium: 136 mmol/L (ref 135–145)
Total Bilirubin: 0.3 mg/dL (ref 0.3–1.2)
Total Protein: 6.9 g/dL (ref 6.5–8.1)

## 2021-04-08 LAB — MAGNESIUM: Magnesium: 2.1 mg/dL (ref 1.7–2.4)

## 2021-04-08 MED ORDER — PROCHLORPERAZINE MALEATE 10 MG PO TABS
10.0000 mg | ORAL_TABLET | Freq: Once | ORAL | Status: AC
  Administered 2021-04-08: 10 mg via ORAL
  Filled 2021-04-08: qty 1

## 2021-04-08 MED ORDER — BORTEZOMIB CHEMO SQ INJECTION 3.5 MG (2.5MG/ML)
1.3000 mg/m2 | Freq: Once | INTRAMUSCULAR | Status: AC
  Administered 2021-04-08: 2 mg via SUBCUTANEOUS
  Filled 2021-04-08: qty 0.8

## 2021-04-08 MED ORDER — DEXAMETHASONE 4 MG PO TABS
10.0000 mg | ORAL_TABLET | Freq: Once | ORAL | Status: AC
  Administered 2021-04-08: 10 mg via ORAL
  Filled 2021-04-08: qty 3

## 2021-04-08 NOTE — Patient Instructions (Signed)
[facility]  Discharge Instructions: Thank you for choosing [facility] to provide your oncology and hematology care.  If you have a lab appointment with the [facility], please come in thru the Main Entrance and check in at the main information desk.  Wear comfortable clothing and clothing appropriate for easy access to any Portacath or PICC line.   We strive to give you quality time with your provider. You may need to reschedule your appointment if you arrive late (15 or more minutes).  Arriving late affects you and other patients whose appointments are after yours.  Also, if you miss three or more appointments without notifying the office, you may be dismissed from the clinic at the providers discretion.      For prescription refill requests, have your pharmacy contact our office and allow 72 hours for refills to be completed.    Today you received the following chemotherapy and/or immunotherapy agents Velcade      To help prevent nausea and vomiting after your treatment, we encourage you to take your nausea medication as directed.  BELOW ARE SYMPTOMS THAT SHOULD BE REPORTED IMMEDIATELY: *FEVER GREATER THAN 100.4 F (38 C) OR HIGHER *CHILLS OR SWEATING *NAUSEA AND VOMITING THAT IS NOT CONTROLLED WITH YOUR NAUSEA MEDICATION *UNUSUAL SHORTNESS OF BREATH *UNUSUAL BRUISING OR BLEEDING *URINARY PROBLEMS (pain or burning when urinating, or frequent urination) *BOWEL PROBLEMS (unusual diarrhea, constipation, pain near the anus) TENDERNESS IN MOUTH AND THROAT WITH OR WITHOUT PRESENCE OF ULCERS (sore throat, sores in mouth, or a toothache) UNUSUAL RASH, SWELLING OR PAIN  UNUSUAL VAGINAL DISCHARGE OR ITCHING   Items with * indicate a potential emergency and should be followed up as soon as possible or go to the Emergency Department if any problems should occur.  Please show the CHEMOTHERAPY ALERT CARD or IMMUNOTHERAPY ALERT CARD at check-in to the Emergency  Department and triage nurse.  Should you have questions after your visit or need to cancel or reschedule your appointment, please contact [facility] [phone]  and follow the prompts.  Office hours are 8:00 a.m. to 4:30 p.m. Monday - Friday. Please note that voicemails left after 4:00 p.m. may not be returned until the following business day.  We are closed weekends and major holidays. You have access to a nurse at all times for urgent questions. Please call the main number to the clinic [phone] and follow the prompts.  For any non-urgent questions, you may also contact your provider using MyChart. We now offer e-Visits for anyone 84 and older to request care online for non-urgent symptoms. For details visit [email or web address].   Also download the MyChart app! Go to the app store, search "MyChart", open the app, select [facility], and log in with your MyChart username and password.  Due to Covid, a mask is required upon entering the hospital/clinic. If you do not have a mask, one will be given to you upon arrival. For doctor visits, patients may have 1 support person aged 33 or older with them. For treatment visits, patients cannot have anyone with them due to current Covid guidelines and our immunocompromised population.

## 2021-04-08 NOTE — Progress Notes (Signed)
Patient presents today for Velcade injection per providers order.  Vital signs within parameters.  Labs pending.  Patient has no new complaints at this time.  Labs within parameters for treatment.  Stable during Velcade administration without incident; injection site WNL; see MAR for injection details.  Patient tolerated procedure well and without incident.  No questions or complaints noted at this time.  Discharge from clinic via wheelchair in stable condition.  Alert and oriented X 3.  Follow up with [facility] as scheduled.

## 2021-04-09 DIAGNOSIS — N186 End stage renal disease: Secondary | ICD-10-CM | POA: Diagnosis not present

## 2021-04-09 DIAGNOSIS — Z992 Dependence on renal dialysis: Secondary | ICD-10-CM | POA: Diagnosis not present

## 2021-04-09 DIAGNOSIS — N2581 Secondary hyperparathyroidism of renal origin: Secondary | ICD-10-CM | POA: Diagnosis not present

## 2021-04-09 MED ORDER — OCTREOTIDE ACETATE 30 MG IM KIT
PACK | INTRAMUSCULAR | Status: AC
  Filled 2021-04-09: qty 1

## 2021-04-10 DIAGNOSIS — C9000 Multiple myeloma not having achieved remission: Secondary | ICD-10-CM | POA: Diagnosis not present

## 2021-04-10 DIAGNOSIS — Z1159 Encounter for screening for other viral diseases: Secondary | ICD-10-CM | POA: Diagnosis not present

## 2021-04-10 MED ORDER — OCTREOTIDE ACETATE 30 MG IM KIT
PACK | INTRAMUSCULAR | Status: AC
  Filled 2021-04-10: qty 1

## 2021-04-11 DIAGNOSIS — N186 End stage renal disease: Secondary | ICD-10-CM | POA: Diagnosis not present

## 2021-04-11 DIAGNOSIS — N2581 Secondary hyperparathyroidism of renal origin: Secondary | ICD-10-CM | POA: Diagnosis not present

## 2021-04-11 DIAGNOSIS — Z992 Dependence on renal dialysis: Secondary | ICD-10-CM | POA: Diagnosis not present

## 2021-04-14 DIAGNOSIS — N2581 Secondary hyperparathyroidism of renal origin: Secondary | ICD-10-CM | POA: Diagnosis not present

## 2021-04-14 DIAGNOSIS — Z992 Dependence on renal dialysis: Secondary | ICD-10-CM | POA: Diagnosis not present

## 2021-04-14 DIAGNOSIS — N186 End stage renal disease: Secondary | ICD-10-CM | POA: Diagnosis not present

## 2021-04-14 NOTE — Progress Notes (Signed)
Results reviewed by Dr. Delton Coombes at 1/12 visit.

## 2021-04-14 NOTE — Progress Notes (Signed)
Results reviewed by Dr. Delton Coombes at 1/12 visit

## 2021-04-16 DIAGNOSIS — Z992 Dependence on renal dialysis: Secondary | ICD-10-CM | POA: Diagnosis not present

## 2021-04-16 DIAGNOSIS — N186 End stage renal disease: Secondary | ICD-10-CM | POA: Diagnosis not present

## 2021-04-16 DIAGNOSIS — N2581 Secondary hyperparathyroidism of renal origin: Secondary | ICD-10-CM | POA: Diagnosis not present

## 2021-04-17 ENCOUNTER — Other Ambulatory Visit (HOSPITAL_COMMUNITY): Payer: Medicare PPO

## 2021-04-17 ENCOUNTER — Ambulatory Visit (HOSPITAL_COMMUNITY): Payer: Medicare PPO

## 2021-04-17 DIAGNOSIS — Z1159 Encounter for screening for other viral diseases: Secondary | ICD-10-CM | POA: Diagnosis not present

## 2021-04-17 DIAGNOSIS — C9000 Multiple myeloma not having achieved remission: Secondary | ICD-10-CM | POA: Diagnosis not present

## 2021-04-18 ENCOUNTER — Other Ambulatory Visit (HOSPITAL_COMMUNITY): Payer: Medicare PPO

## 2021-04-18 ENCOUNTER — Ambulatory Visit (HOSPITAL_COMMUNITY): Payer: Medicare PPO

## 2021-04-18 DIAGNOSIS — N2581 Secondary hyperparathyroidism of renal origin: Secondary | ICD-10-CM | POA: Diagnosis not present

## 2021-04-18 DIAGNOSIS — N186 End stage renal disease: Secondary | ICD-10-CM | POA: Diagnosis not present

## 2021-04-18 DIAGNOSIS — Z992 Dependence on renal dialysis: Secondary | ICD-10-CM | POA: Diagnosis not present

## 2021-04-21 DIAGNOSIS — Z992 Dependence on renal dialysis: Secondary | ICD-10-CM | POA: Diagnosis not present

## 2021-04-21 DIAGNOSIS — N186 End stage renal disease: Secondary | ICD-10-CM | POA: Diagnosis not present

## 2021-04-21 DIAGNOSIS — N2581 Secondary hyperparathyroidism of renal origin: Secondary | ICD-10-CM | POA: Diagnosis not present

## 2021-04-22 ENCOUNTER — Inpatient Hospital Stay (HOSPITAL_COMMUNITY): Payer: Medicare PPO

## 2021-04-22 ENCOUNTER — Inpatient Hospital Stay (HOSPITAL_COMMUNITY): Payer: Medicare PPO | Attending: Hematology

## 2021-04-22 VITALS — BP 114/62 | HR 68 | Temp 97.5°F | Resp 18 | Ht 63.0 in | Wt 127.2 lb

## 2021-04-22 DIAGNOSIS — C9000 Multiple myeloma not having achieved remission: Secondary | ICD-10-CM | POA: Diagnosis not present

## 2021-04-22 DIAGNOSIS — Z5111 Encounter for antineoplastic chemotherapy: Secondary | ICD-10-CM | POA: Insufficient documentation

## 2021-04-22 LAB — CBC WITH DIFFERENTIAL/PLATELET
Abs Immature Granulocytes: 0.01 10*3/uL (ref 0.00–0.07)
Basophils Absolute: 0 10*3/uL (ref 0.0–0.1)
Basophils Relative: 1 %
Eosinophils Absolute: 0.2 10*3/uL (ref 0.0–0.5)
Eosinophils Relative: 4 %
HCT: 33.5 % — ABNORMAL LOW (ref 36.0–46.0)
Hemoglobin: 10.9 g/dL — ABNORMAL LOW (ref 12.0–15.0)
Immature Granulocytes: 0 %
Lymphocytes Relative: 20 %
Lymphs Abs: 0.9 10*3/uL (ref 0.7–4.0)
MCH: 33.1 pg (ref 26.0–34.0)
MCHC: 32.5 g/dL (ref 30.0–36.0)
MCV: 101.8 fL — ABNORMAL HIGH (ref 80.0–100.0)
Monocytes Absolute: 0.4 10*3/uL (ref 0.1–1.0)
Monocytes Relative: 9 %
Neutro Abs: 3.1 10*3/uL (ref 1.7–7.7)
Neutrophils Relative %: 66 %
Platelets: 221 10*3/uL (ref 150–400)
RBC: 3.29 MIL/uL — ABNORMAL LOW (ref 3.87–5.11)
RDW: 17.6 % — ABNORMAL HIGH (ref 11.5–15.5)
WBC: 4.6 10*3/uL (ref 4.0–10.5)
nRBC: 0 % (ref 0.0–0.2)

## 2021-04-22 LAB — COMPREHENSIVE METABOLIC PANEL
ALT: 14 U/L (ref 0–44)
AST: 21 U/L (ref 15–41)
Albumin: 3.7 g/dL (ref 3.5–5.0)
Alkaline Phosphatase: 84 U/L (ref 38–126)
Anion gap: 18 — ABNORMAL HIGH (ref 5–15)
BUN: 39 mg/dL — ABNORMAL HIGH (ref 8–23)
CO2: 22 mmol/L (ref 22–32)
Calcium: 8.9 mg/dL (ref 8.9–10.3)
Chloride: 95 mmol/L — ABNORMAL LOW (ref 98–111)
Creatinine, Ser: 6.59 mg/dL — ABNORMAL HIGH (ref 0.44–1.00)
GFR, Estimated: 6 mL/min — ABNORMAL LOW (ref >60)
Glucose, Bld: 232 mg/dL — ABNORMAL HIGH (ref 70–99)
Potassium: 4.6 mmol/L (ref 3.5–5.1)
Sodium: 135 mmol/L (ref 135–145)
Total Bilirubin: 0.3 mg/dL (ref 0.3–1.2)
Total Protein: 6.7 g/dL (ref 6.5–8.1)

## 2021-04-22 LAB — MAGNESIUM: Magnesium: 2 mg/dL (ref 1.7–2.4)

## 2021-04-22 MED ORDER — BORTEZOMIB CHEMO SQ INJECTION 3.5 MG (2.5MG/ML)
1.3000 mg/m2 | Freq: Once | INTRAMUSCULAR | Status: AC
  Administered 2021-04-22: 2 mg via SUBCUTANEOUS
  Filled 2021-04-22: qty 0.8

## 2021-04-22 MED ORDER — PROCHLORPERAZINE MALEATE 10 MG PO TABS
10.0000 mg | ORAL_TABLET | Freq: Once | ORAL | Status: AC
  Administered 2021-04-22: 10 mg via ORAL
  Filled 2021-04-22: qty 1

## 2021-04-22 MED ORDER — DEXAMETHASONE 4 MG PO TABS
10.0000 mg | ORAL_TABLET | Freq: Once | ORAL | Status: AC
  Administered 2021-04-22: 10 mg via ORAL
  Filled 2021-04-22: qty 3

## 2021-04-22 NOTE — Progress Notes (Signed)
Patient presents today for Velcade injection.  Patient is in satisfactory condition with no new complaints voiced.  Vital signs are stable.  Labs reviewed.  All labs are within treatment parameters.  We will proceed with treatment per MD orders.   Patient tolerated treatment well with no complaints voiced.  Patient left via wheelchair with [facility] staff in stable condition.  Vital signs stable at discharge.  Follow up as scheduled.

## 2021-04-22 NOTE — Patient Instructions (Signed)
[facility]  Discharge Instructions: Thank you for choosing [facility] to provide your oncology and hematology care.  If you have a lab appointment with the Cancer Center, please come in thru the Main Entrance and check in at the main information desk.  Wear comfortable clothing and clothing appropriate for easy access to any Portacath or PICC line.   We strive to give you quality time with your provider. You may need to reschedule your appointment if you arrive late (15 or more minutes).  Arriving late affects you and other patients whose appointments are after yours.  Also, if you miss three or more appointments without notifying the office, you may be dismissed from the clinic at the provider's discretion.      For prescription refill requests, have your pharmacy contact our office and allow 72 hours for refills to be completed.        To help prevent nausea and vomiting after your treatment, we encourage you to take your nausea medication as directed.  BELOW ARE SYMPTOMS THAT SHOULD BE REPORTED IMMEDIATELY: *FEVER GREATER THAN 100.4 F (38 C) OR HIGHER *CHILLS OR SWEATING *NAUSEA AND VOMITING THAT IS NOT CONTROLLED WITH YOUR NAUSEA MEDICATION *UNUSUAL SHORTNESS OF BREATH *UNUSUAL BRUISING OR BLEEDING *URINARY PROBLEMS (pain or burning when urinating, or frequent urination) *BOWEL PROBLEMS (unusual diarrhea, constipation, pain near the anus) TENDERNESS IN MOUTH AND THROAT WITH OR WITHOUT PRESENCE OF ULCERS (sore throat, sores in mouth, or a toothache) UNUSUAL RASH, SWELLING OR PAIN  UNUSUAL VAGINAL DISCHARGE OR ITCHING   Items with * indicate a potential emergency and should be followed up as soon as possible or go to the Emergency Department if any problems should occur.  Please show the CHEMOTHERAPY ALERT CARD or IMMUNOTHERAPY ALERT CARD at check-in to the Emergency Department and triage nurse.  Should you have questions after your visit or need to cancel  or reschedule your appointment, please contact [facility] [phone]  and follow the prompts.  Office hours are 8:00 a.m. to 4:30 p.m. Monday - Friday. Please note that voicemails left after 4:00 p.m. may not be returned until the following business day.  We are closed weekends and major holidays. You have access to a nurse at all times for urgent questions. Please call the main number to the clinic [phone] and follow the prompts.  For any non-urgent questions, you may also contact your provider using MyChart. We now offer e-Visits for anyone 18 and older to request care online for non-urgent symptoms. For details visit [email or web address].   Also download the MyChart app! Go to the app store, search "MyChart", open the app, select [facility] [place], and log in with your MyChart username and password.  Due to Covid, a mask is required upon entering the hospital/clinic. If you do not have a mask, one will be given to you upon arrival. For doctor visits, patients may have 1 support person aged 18 or older with them. For treatment visits, patients cannot have anyone with them due to current Covid guidelines and our immunocompromised population.  

## 2021-04-23 DIAGNOSIS — N2581 Secondary hyperparathyroidism of renal origin: Secondary | ICD-10-CM | POA: Diagnosis not present

## 2021-04-23 DIAGNOSIS — N186 End stage renal disease: Secondary | ICD-10-CM | POA: Diagnosis not present

## 2021-04-23 DIAGNOSIS — Z992 Dependence on renal dialysis: Secondary | ICD-10-CM | POA: Diagnosis not present

## 2021-04-24 DIAGNOSIS — Z1159 Encounter for screening for other viral diseases: Secondary | ICD-10-CM | POA: Diagnosis not present

## 2021-04-24 DIAGNOSIS — C9000 Multiple myeloma not having achieved remission: Secondary | ICD-10-CM | POA: Diagnosis not present

## 2021-04-25 ENCOUNTER — Encounter: Payer: Self-pay | Admitting: Adult Health

## 2021-04-25 DIAGNOSIS — K269 Duodenal ulcer, unspecified as acute or chronic, without hemorrhage or perforation: Secondary | ICD-10-CM

## 2021-04-25 DIAGNOSIS — E43 Unspecified severe protein-calorie malnutrition: Secondary | ICD-10-CM

## 2021-04-25 DIAGNOSIS — K219 Gastro-esophageal reflux disease without esophagitis: Secondary | ICD-10-CM

## 2021-04-25 DIAGNOSIS — Z992 Dependence on renal dialysis: Secondary | ICD-10-CM | POA: Diagnosis not present

## 2021-04-25 DIAGNOSIS — B009 Herpesviral infection, unspecified: Secondary | ICD-10-CM

## 2021-04-25 DIAGNOSIS — N2581 Secondary hyperparathyroidism of renal origin: Secondary | ICD-10-CM | POA: Diagnosis not present

## 2021-04-25 DIAGNOSIS — F339 Major depressive disorder, recurrent, unspecified: Secondary | ICD-10-CM

## 2021-04-25 DIAGNOSIS — N186 End stage renal disease: Secondary | ICD-10-CM | POA: Diagnosis not present

## 2021-04-28 DIAGNOSIS — N2581 Secondary hyperparathyroidism of renal origin: Secondary | ICD-10-CM | POA: Diagnosis not present

## 2021-04-28 DIAGNOSIS — N186 End stage renal disease: Secondary | ICD-10-CM | POA: Diagnosis not present

## 2021-04-28 DIAGNOSIS — Z992 Dependence on renal dialysis: Secondary | ICD-10-CM | POA: Diagnosis not present

## 2021-04-29 ENCOUNTER — Non-Acute Institutional Stay (SKILLED_NURSING_FACILITY): Payer: Medicare PPO | Admitting: Adult Health

## 2021-04-29 ENCOUNTER — Encounter: Payer: Self-pay | Admitting: Adult Health

## 2021-04-29 ENCOUNTER — Other Ambulatory Visit (HOSPITAL_COMMUNITY)
Admission: RE | Admit: 2021-04-29 | Discharge: 2021-04-29 | Disposition: A | Discharge status: Home / Self Care | Payer: Medicare PPO | Source: Skilled Nursing Facility | Attending: Internal Medicine | Admitting: Internal Medicine

## 2021-04-29 DIAGNOSIS — N186 End stage renal disease: Secondary | ICD-10-CM | POA: Diagnosis not present

## 2021-04-29 DIAGNOSIS — F339 Major depressive disorder, recurrent, unspecified: Secondary | ICD-10-CM | POA: Insufficient documentation

## 2021-04-29 DIAGNOSIS — D631 Anemia in chronic kidney disease: Secondary | ICD-10-CM | POA: Diagnosis not present

## 2021-04-29 DIAGNOSIS — I132 Hypertensive heart and chronic kidney disease with heart failure and with stage 5 chronic kidney disease, or end stage renal disease: Secondary | ICD-10-CM | POA: Diagnosis not present

## 2021-04-29 DIAGNOSIS — U071 COVID-19: Secondary | ICD-10-CM | POA: Diagnosis not present

## 2021-04-29 DIAGNOSIS — E43 Unspecified severe protein-calorie malnutrition: Secondary | ICD-10-CM | POA: Insufficient documentation

## 2021-04-29 DIAGNOSIS — Z1159 Encounter for screening for other viral diseases: Secondary | ICD-10-CM | POA: Diagnosis not present

## 2021-04-29 DIAGNOSIS — C9000 Multiple myeloma not having achieved remission: Secondary | ICD-10-CM | POA: Diagnosis not present

## 2021-04-29 DIAGNOSIS — E1122 Type 2 diabetes mellitus with diabetic chronic kidney disease: Secondary | ICD-10-CM | POA: Diagnosis not present

## 2021-04-29 DIAGNOSIS — J439 Emphysema, unspecified: Secondary | ICD-10-CM | POA: Diagnosis not present

## 2021-04-29 DIAGNOSIS — Z2839 Other underimmunization status: Secondary | ICD-10-CM | POA: Diagnosis not present

## 2021-04-29 DIAGNOSIS — Z992 Dependence on renal dialysis: Secondary | ICD-10-CM | POA: Insufficient documentation

## 2021-04-29 DIAGNOSIS — R0989 Other specified symptoms and signs involving the circulatory and respiratory systems: Secondary | ICD-10-CM | POA: Diagnosis not present

## 2021-04-29 LAB — CBC
HCT: 34.6 % — ABNORMAL LOW (ref 36.0–46.0)
Hemoglobin: 11 g/dL — ABNORMAL LOW (ref 12.0–15.0)
MCH: 32.1 pg (ref 26.0–34.0)
MCHC: 31.8 g/dL (ref 30.0–36.0)
MCV: 100.9 fL — ABNORMAL HIGH (ref 80.0–100.0)
Platelets: 179 10*3/uL (ref 150–400)
RBC: 3.43 MIL/uL — ABNORMAL LOW (ref 3.87–5.11)
RDW: 17.5 % — ABNORMAL HIGH (ref 11.5–15.5)
WBC: 5.4 10*3/uL (ref 4.0–10.5)
nRBC: 0 % (ref 0.0–0.2)

## 2021-04-29 LAB — BASIC METABOLIC PANEL
Anion gap: 14 (ref 5–15)
BUN: 44 mg/dL — ABNORMAL HIGH (ref 8–23)
CO2: 28 mmol/L (ref 22–32)
Calcium: 8.7 mg/dL — ABNORMAL LOW (ref 8.9–10.3)
Chloride: 97 mmol/L — ABNORMAL LOW (ref 98–111)
Creatinine, Ser: 7.05 mg/dL — ABNORMAL HIGH (ref 0.44–1.00)
GFR, Estimated: 5 mL/min — ABNORMAL LOW (ref >60)
Glucose, Bld: 82 mg/dL (ref 70–99)
Potassium: 4.6 mmol/L (ref 3.5–5.1)
Sodium: 139 mmol/L (ref 135–145)

## 2021-04-29 LAB — D-DIMER, QUANTITATIVE: D-Dimer, Quant: 1.25 ug/mL-FEU — ABNORMAL HIGH (ref 0.00–0.50)

## 2021-04-29 LAB — C-REACTIVE PROTEIN: CRP: 4 mg/dL — ABNORMAL HIGH (ref <1.0)

## 2021-04-29 MED ORDER — VITAMIN C 500 MG PO CAPS: 500.0000 mg | ORAL_CAPSULE | Freq: Two times a day (BID) | ORAL | 0 refills | Status: AC

## 2021-04-29 MED ORDER — ERGOCALCIFEROL 1.25 MG (50000 UT) PO CAPS: 50000.0000 [IU] | ORAL_CAPSULE | ORAL | 0 refills | Status: AC

## 2021-04-29 NOTE — Progress Notes (Signed)
Location:  [place] Room Number: 148-D Place of Service:  SNF (31)   CODE STATUS: DNR  Allergies  Allergen Reactions   Ace Inhibitors Other (See Comments) and Cough    Tongue swell , ie angioedema   Angiotensin Receptor Blockers     Angioedema with ACE-I   Penicillins Other (See Comments)    Unsure of reaction Has patient had a PCN reaction causing immediate rash, facial/tongue/throat swelling, SOB or lightheadedness with hypotension: Unknown Has patient had a PCN reaction causing severe rash involving mucus membranes or skin necrosis: Unknown Has patient had a PCN reaction that required hospitalization: No Has patient had a PCN reaction occurring within the last 10 years: Unknown If all of the above answers are "NO", then may proceed with Cephalosporin use.     Penicillin G     Chief Complaint  Patient presents with   Acute Visit    Covid positive     HPI:  She has tested positive for covid. She is without symptoms no reports of cough; no shortness of breath; no excessive fatigue no body aches and pains. No reports of fevers. Her CRP is 4.0.   Past Medical History:  Diagnosis Date   Anemia     chronic macrocytic anemia   Anxiety    Chronic kidney disease    Chronic renal disease, stage 4, severely decreased glomerular filtration rate (GFR) between 15-29 mL/min/1.73 square meter ([employer]) 62/86/3817   Complication of anesthesia    delirious after Breast Surgery   Dementia ([place])    mild   Depression    Diabetes mellitus with ESRD (end-stage renal disease) ([place])    type II   Dysphagia    Dyspnea    with activity   GERD (gastroesophageal reflux disease)    Glaucoma    Hyperlipidemia    Hypertension    Pneumonia    Stage 1 infiltrating ductal carcinoma of right female breast ([place]) 08/21/2015   ER+ PR+ HER 2 neu + (3+) T1cN0     Past Surgical History:  Procedure Laterality Date   A/V FISTULAGRAM Right 07/05/2020   Procedure: A/V  Fistulagram;  Surgeon: Elam Dutch, MD;  Location: [facility];  Service: Cardiovascular;  Laterality: Right;   AV FISTULA PLACEMENT Left 11/22/2017   Procedure: ARTERIOVENOUS (AV) FISTULA CREATION LEFT ARM;  Surgeon: Elam Dutch, MD;  Location: [facility] OR;  Service: Vascular;  Laterality: Left;   AV FISTULA PLACEMENT Right 04/04/2020   Procedure: RIGHT ARM ARTERIOVENOUS FISTULA CREATION;  Surgeon: Rosetta Posner, MD;  Location[facility];  Service: Vascular;  Laterality: Right;   AV FISTULA PLACEMENT Right 08/20/2020   Procedure: ARTERIOVENOUS (AV) FISTULA LIGATION RIGHT ARM;  Surgeon: Elam Dutch, MD;  Location: [facility] OR;  Service: Vascular;  Laterality: Right;   BIOPSY  08/07/2016   Procedure: BIOPSY;  Surgeon: Daneil Dolin, MD;  [facility];  Service: Endoscopy;;  gastric ulcer biopsy   COLONOSCOPY     ESOPHAGOGASTRODUODENOSCOPY N/A 08/07/2016   LA Grade A esophagitis s/p dilation, small hiatal hernia, multiple gastric ulcers and erosions, duodenal erosions s/p biopsy. Negative H.pylori    ESOPHAGOGASTRODUODENOSCOPY N/A 11/27/2016   normal esophagus, previously noted gastric ulcers completely healed, normal duodenum.    ESOPHAGOGASTRODUODENOSCOPY (EGD) WITH PROPOFOL N/A 07/23/2020   Procedure: ESOPHAGOGASTRODUODENOSCOPY (EGD) WITH PROPOFOL;  Surgeon: Rogene Houston, MD;  [facility];  Service: Endoscopy;  Laterality: N/A;   FISTULA SUPERFICIALIZATION Left 02/14/2018  Procedure: FISTULA SUPERFICIALIZATION LEFT ARM;  Surgeon: Angelia Mould, MD;  Location: [facility] OR;  Service: Vascular;  Laterality: Left;   FRACTURE SURGERY Right    ankle   HOT HEMOSTASIS  07/23/2020   Procedure: HOT HEMOSTASIS (ARGON PLASMA COAGULATION/BICAP);  Surgeon: Rogene Houston, MD;  [facility];  Service: Endoscopy;;   INTRAMEDULLARY (IM) NAIL INTERTROCHANTERIC Right 07/12/2020   Procedure: INTRAMEDULLARY (IM) NAIL INTERTROCHANTRIC;  Surgeon: Mordecai Rasmussen, MD;   Location[facility];  Service: Orthopedics;  Laterality: Right;   MALONEY DILATION N/A 08/07/2016   Procedure: Venia Minks DILATION;  Surgeon: Daneil Dolin, MD;  [facility];  Service: Endoscopy;  Laterality: N/A;   MASTECTOMY, PARTIAL Right    multiple myeloma     PERIPHERAL VASCULAR BALLOON ANGIOPLASTY Left 07/13/2019   Procedure: PERIPHERAL VASCULAR BALLOON ANGIOPLASTY;  Surgeon: Marty Heck, MD;  Location: [facility];  Service: Cardiovascular;  Laterality: Left;  arm fistulogram   PERIPHERAL VASCULAR BALLOON ANGIOPLASTY Right 05/22/2020   Procedure: PERIPHERAL VASCULAR BALLOON ANGIOPLASTY;  Surgeon: Cherre Robins, MD;  Location: [facility];  Service: Cardiovascular;  Laterality: Right;  arm fistula   PERIPHERAL VASCULAR BALLOON ANGIOPLASTY Right 07/05/2020   Procedure: PERIPHERAL VASCULAR BALLOON ANGIOPLASTY;  Surgeon: Elam Dutch, MD;  Location: [place];  Service: Cardiovascular;  Laterality: Right;  arm fistula   PORT-A-CATH REMOVAL Left 11/22/2017   Procedure: REMOVAL PORT-A-CATH LEFT CHEST;  Surgeon: Elam Dutch, MD;  Location: [facility];  Service: Vascular;  Laterality: Left;   RETINAL DETACHMENT SURGERY Right    SCLEROTHERAPY  07/23/2020   Procedure: SCLEROTHERAPY;  Surgeon: Rogene Houston, MD;  [facility];  Service: Endoscopy;;   UPPER EXTREMITY VENOGRAPHY N/A 08/22/2020   Procedure: LEFT UPPER & CENTRAL VENOGRAPHY;  Surgeon: Marty Heck, MD;  Location: [place] [facility];  Service: Cardiovascular;  Laterality: N/A;    Social History   Socioeconomic History   Marital status: Single    Spouse name: Not on file   Number of children: Not on file   Years of education: Not on file   Highest education level: Not on file  Occupational History   Occupation: retired   Tobacco Use   Smoking status: Never   Smokeless tobacco: Never  Vaping Use   Vaping Use: Never used  Substance and Sexual Activity   Alcohol  use: No    Alcohol/week: 0.0 standard drinks   Drug use: No   Sexual activity: Never  Other Topics Concern   Not on file  Social History Narrative   Long term resident of SNF    Social Determinants of Health   Financial Resource Strain: Not on file  Food Insecurity: Not on file  Transportation Needs: Not on file  Physical Activity: Not on file  Stress: Not on file  Social Connections: Not on file  Intimate Partner Violence: Not on file   Family History  Problem Relation Age of Onset   Multiple myeloma Sister    Brain cancer Sister    Dementia Mother        died at 52   Stroke Mother    Heart failure Mother    Diabetes Mother    Heart disease Father    Prostate cancer Brother    Colon cancer Neg Hx       VITAL SIGNS BP 103/65    Pulse 80    Temp 98.9 F (37.2 C)  Resp 20    Ht '5\' 3"'  (1.6 m)    Wt 139 lb (63 kg)    SpO2 94%    BMI 24.62 kg/m   Facility-Administered Encounter Medications as of 04/29/2021  Medication   octreotide (SANDOSTATIN LAR) 30 MG IM injection   octreotide (SANDOSTATIN LAR) 30 MG IM injection   Outpatient Encounter Medications as of 04/29/2021  Medication Sig   acetaminophen (TYLENOL) 325 MG tablet Take 650 mg by mouth every 8 (eight) hours.   acyclovir (ZOVIRAX) 200 MG capsule Take 200 mg by mouth in the morning. (0800)   Amino Acids-Protein Hydrolys (FEEDING SUPPLEMENT, PRO-STAT SUGAR FREE 64,) LIQD Take 30 mLs by mouth 3 (three) times daily with meals. (0800, 1200 & 1800)   anastrozole (ARIMIDEX) 1 MG tablet TAKE 1 TABLET BY MOUTH DAILY   Ascorbic Acid (VITAMIN C) 500 MG CAPS Take 500 mg by mouth 2 (two) times daily for 14 days.   atenolol (TENORMIN) 25 MG tablet Take 1 tablet (25 mg total) by mouth in the morning. (0800)   atorvastatin (LIPITOR) 10 MG tablet Take 10 mg by mouth at bedtime. (2000)   Balsam [place]-Castor Oil (VENELEX) OINT Apply topically. Apply to bilateral buttocks and sacrum every shift.   Calcium Acetate 667 MG TABS Take 2  tablets by mouth 3 (three) times daily. 8 am, 5 pm, and 8 pm   calcium-vitamin D (OSCAL WITH D) 500-200 MG-UNIT tablet Take 1 tablet by mouth in the morning. (0800)   cloNIDine (CATAPRES) 0.1 MG tablet Take 1 tablet (0.1 mg total) by mouth at bedtime. (2000) Hold for SBP < 110 bpm   ergocalciferol (VITAMIN D2) 1.25 MG (50000 UT) capsule Take 1 capsule (50,000 Units total) by mouth once a week for 14 days.   insulin glargine (LANTUS SOLOSTAR) 100 UNIT/ML Solostar Pen Inject 5 Units into the skin at bedtime. (2000)   melatonin 3 MG TABS tablet Take 6 mg by mouth at bedtime. (2000) For Sleep   molnupiravir EUA (LAGEVRIO) 200 mg CAPS capsule Take 4 capsules (800 mg total) by mouth 2 (two) times daily.   multivitamin (RENA-VIT) TABS tablet Take 1 tablet by mouth at bedtime. (2100)   NON FORMULARY Diet-Regular with thin liquids   ondansetron (ZOFRAN) 4 MG tablet Take 1 tablet (4 mg total) by mouth every 6 (six) hours as needed for nausea.   pantoprazole (PROTONIX) 40 MG tablet Take 40 mg by mouth 2 (two) times daily.   sertraline (ZOLOFT) 25 MG tablet Take 25 mg by mouth daily. At 9 am along with 50 mg tablet for a total of 75 mg daily   sertraline (ZOLOFT) 50 MG tablet Take 50 mg by mouth daily. At 9 am along with 25 mg tablet for a total of 75 mg daily   sucralfate (CARAFATE) 1 GM/10ML suspension Take 1 g by mouth in the morning, at noon, in the evening, and at bedtime. (0800, 1200, 1700 & 2100)   Zinc 50 MG TABS Take 1 tablet by mouth daily.   [DISCONTINUED] ammonium lactate (AMLACTIN) 12 % cream Apply 1 application topically at bedtime.     SIGNIFICANT DIAGNOSTIC EXAMS   PREVIOUS   07-11-20: chest x-ray:  Central catheter tip at cavoatrial junction. No pneumothorax. Mild left base atelectasis. No edema or airspace opacity. Stable cardiac silhouette. Aortic Atherosclerosis   07-11-20: right knee x-ray:  No acute fracture or dislocation is noted. Diffuse vascular calcifications are seen. No soft  tissue abnormality is noted.  07-11-20: right hip  x-ray:  Acute, impacted, angulated right basicervical femoral neck fracture.   07-23-20: upper endoscopy:  Normal hypopharynx. - Normal esophagus. - Z-line irregular, 34 cm from the incisors. - Non-bleeding gastric ulcers with no stigmata of bleeding. - Duodenal ulcer with stigmata of bleed/ pigmented material. Gold probe therapy was not successful and hemostasis achieved with injection of dilute epinephrine. - Non-bleeding duodenal ulcer with no stigmata of bleeding(second bulbar ulcer) - Normal second portion of the duodenum. - No specimens collected.  07-23-20: chest x-ray:No active disease. Aortic Atherosclerosis    NO NEW EXAMS.   LABS REVIEWED PREVIOUS  05-21-20; wbc 5.1; hgb 10.4; hct 32.8; mcv 106.8 plt 303; glucose 186; bun 41; creat 1.54; k+ 5.0; na++ 132; ca 8.4 GFR 5; liver normal albumin 3.7 06-13-20: chol 189; ldl 106; trig 211; hdl 41; hgb a1c 6.2   07-11-20: wbc 9.1; hgb 10.6; hct 34.6; mcv 109.1 plt 248; glucose 79; bun 45; creat 7.71 k+ 4.9; na++ 134; ca 8.1 GFR 5; liver normal albumin 3.5 mag 2.4 07-13-20: wbc 11.4; hgb 8.9; hct 29.7; mcv 108.8 plt 303; glucose 178; bun 66; creat 11.17; k+ 6.6; na++ 135; ca 8.2; GFR 3 07-15-20: wbc 11.0; hgb 7.5; hct 25.2; mcv 108.2 plt 251; glucose 117; bun 60; creat 9.41; k+ 5.5; na++ 138; ca 8.4 GFR 4 07-17-20: wbc 11.5; hgb 9.4; hct 29.8; mcv 100.3 plt 300; glucose 140; bun 68; creat 8.04; k+ 4.2; na++ 135; ca 8.5 GFR 5 07-22-20: wbc 11.6; hgb 7.1; hct 23.5; mcv 104.0 plt 444; glucose 214; bun 97; creat 9.18; k+ 4.5; na++ 140; ca 8.0; GFR4 07-24-20: wbc 14.3; hgb 6.7; hct 21.2; mcv 99.5 plt 385; glucose 96; bun 49; creat 6.55; k+ 4.7; na++ 133; ca 8.0; GFR4; mag 2.0 phos 3.8 07-26-20: wbc 10.4; hgb 8.5; hct 26.8; mcv 98.9 plt 430; glucose 111; bun 40;creat 6.90; k+ 4.2; na++ 133; ca 8.5; GFR 6; mag 2.2; phos 3.3    07-30-20: wbc 7.6; hgb 7.3; hct 24.1; mcv 103.4 plt 345; glucose 97; bun 26; creat  5.89; k+ 4.1; na++ 139; ca 8.6; GFR 7; liver normal albumin 2.8.  09-03-20: wbc 5.8; hgb 11.2; hct 37.3; mcv 102.8 plt 274; glucose 175; bun 38; creat 5.72; k+ 3.1; na++ 138; ca 9.2 GFR 7 alk phos 138; albumin 3.4 mag 2.0 09-30-20: wbc 5.6; hgb 13.1; hct 42.6; mcv 97.9 plt 260; glucose 92; bun 23; creat 4.18; k+ 3.1; na++ 134; ca 8.7; GFR 10; liver normal albumin 3.8 10-29-20: wbc 5.3; hgb 12.1; hct 40.0; mcv 96.6 plt 218; glucose 190; bun 54; creat 7.35 k+ 4.0; na++ 134; ca 9.4; GFR 5 liver normal albumin 3.7; mag 2.3 11-19-20: wbc 10.9; hgb 11.3; hct 37.2; mcv 96.6 plt 242; glucose 106; bun 47; creat 6.58; k+ 3.9; na++ 133; ca 9.0; GFR 6 liver normal albumin 3.5  12-02-20: hgb a1c 5.6; chol 167; ldl 83; trig 229; hdl 38 12-03-20; wbc 5.2; hgb 11.1; hct 35.7; mcv 96.5 plt 320; glucose 139; bun 47; creat 6.05; k+ 4.1; na++ 134; ca 9.1; GFR 6; liver normal albumin 3.8 mag 2.4  12-17-20: wbc 5.2; hgb 10.5; hct 33.8; mcv 98.8 plt 256; glucose 136; bun 43; creat 6.19; k+ 3.6; na++ 137; ca 8.9; GFR 6 liver normal albumin 3.9  03-06-21: wbc 4.7; hgb 12.3; hct 38.2; mcv 101.6 plt 221; glucose 205; bun 51; creat 7.06; k+ 4.5; na++ 133; ca 9.1; GFR 5; liver normal albumin 3.8  03-20-21: wbc 5.4; hgb 12.1; hct 37.7; mcv  97.2 plt 254; glucose 111; bun 47; creat 7.34; k+ 4.6; na++ 131; ca 9.6 GFR 5; liver normal albumin 3.9; mag 2.2  TODAY  04-29-21; wbc 5.4; hgb 11.0; hct 34.6;mcv 100.9 plt 179; glucose 82; bun 44; creat 7.04; k+ 4.6; na++ 139; ca 8.7; GFR 5 d-dimer: 1.25; CRP 4.0    Review of Systems  Constitutional:  Negative for malaise/fatigue.  Respiratory:  Negative for cough and shortness of breath.   Cardiovascular:  Negative for chest pain, palpitations and leg swelling.  Gastrointestinal:  Negative for abdominal pain, constipation and heartburn.  Musculoskeletal:  Negative for back pain, joint pain and myalgias.  Skin: Negative.   Neurological:  Negative for dizziness.  Psychiatric/Behavioral:  The  patient is not nervous/anxious.    Physical Exam Constitutional:      General: She is not in acute distress.    Appearance: She is well-developed. She is not diaphoretic.  Neck:     Thyroid: No thyromegaly.  Cardiovascular:     Rate and Rhythm: Normal rate and regular rhythm.     Pulses: Normal pulses.     Heart sounds: Normal heart sounds.  Pulmonary:     Effort: Pulmonary effort is normal. No respiratory distress.     Breath sounds: Normal breath sounds.  Abdominal:     General: Bowel sounds are normal. There is no distension.     Palpations: Abdomen is soft.     Tenderness: There is no abdominal tenderness.  Musculoskeletal:     Cervical back: Neck supple.     Right lower leg: No edema.     Left lower leg: No edema.     Comments: Right hip IM nail 07-12-20    Is able to move all extremities    Lymphadenopathy:     Cervical: No cervical adenopathy.  Skin:    General: Skin is warm and dry.     Comments: Dialysis access on chest wall  Neurological:     Mental Status: She is alert. Mental status is at baseline.  Psychiatric:        Mood and Affect: Mood normal.     ASSESSMENT/ PLAN:  TODAY  Sars-cov-2 positive  Will begin supplements and antivirals.  Will begin prednisone 20 mg daily for 5 days.      Ok Edwards NP [facility]  call [phone]

## 2021-04-30 ENCOUNTER — Encounter: Payer: Self-pay | Admitting: Adult Health

## 2021-04-30 DIAGNOSIS — N186 End stage renal disease: Secondary | ICD-10-CM | POA: Diagnosis not present

## 2021-04-30 DIAGNOSIS — N2581 Secondary hyperparathyroidism of renal origin: Secondary | ICD-10-CM | POA: Diagnosis not present

## 2021-04-30 DIAGNOSIS — Z992 Dependence on renal dialysis: Secondary | ICD-10-CM | POA: Diagnosis not present

## 2021-04-30 NOTE — Progress Notes (Signed)
° °Location:  [facility] °Nursing Home Room Number: 148 °Place of Service:  SNF (31) ° ° °CODE STATUS: dnr  ° °Allergies  °Allergen Reactions  °• Ace Inhibitors Other (See Comments) and Cough  °  Tongue swell , ie angioedema  °• Angiotensin Receptor Blockers   °  Angioedema with ACE-I  °• Penicillins Other (See Comments)  °  Unsure of reaction °Has patient had a PCN reaction causing immediate rash, facial/tongue/throat swelling, SOB or lightheadedness with hypotension: Unknown °Has patient had a PCN reaction causing severe rash involving mucus membranes or skin necrosis: Unknown °Has patient had a PCN reaction that required hospitalization: No °Has patient had a PCN reaction occurring within the last 10 years: Unknown °If all of the above answers are "NO", then may proceed with Cephalosporin use. ° °  °• Penicillin G   ° ° °Chief Complaint  °Patient presents with  °• Acute Visit  °  Care plan meeting   °• Medical Management of Chronic Issues ° °        Herpes: Major depression chronic recurrent:  Protein calorie malnutrition severe: Multiple gastric ulcers: duodenal ulcer; upper GI bleed; esophageal reflux disease without esophagitis  ° ° °HPI: ° ° She is a 85 year old long term resident of this facility being seen for the management of her chronic illnesses:  Herpes: Major depression chronic recurrent:  Protein calorie malnutrition severe: Multiple gastric ulcers: duodenal ulcer; upper GI bleed; esophageal reflux disease without esophagitis. There have been no reports of gastric distress. There are no reports of uncontrolled pain. No changes in her appetite.  ° °Past Medical History:  °Diagnosis Date  °• Anemia   °  chronic macrocytic anemia  °• Anxiety   °• Chronic kidney disease   °• Chronic renal disease, stage 4, severely decreased glomerular filtration rate (GFR) between 15-29 mL/min/1.73 square meter (HCC) 08/22/2015  °• Complication of anesthesia   ° delirious after Breast Surgery  °• Dementia  (HCC)   ° mild  °• Depression   °• Diabetes mellitus with ESRD (end-stage renal disease) (HCC)   ° type II  °• Dysphagia   °• Dyspnea   ° with activity  °• GERD (gastroesophageal reflux disease)   °• Glaucoma   °• Hyperlipidemia   °• Hypertension   °• Pneumonia   °• Stage 1 infiltrating ductal carcinoma of right female breast (HCC) 08/21/2015  ° ER+ PR+ HER 2 neu + (3+) T1cN0   ° ° °Past Surgical History:  °Procedure Laterality Date  °• A/V FISTULAGRAM Right 07/05/2020  ° Procedure: A/V Fistulagram;  Surgeon: Fields, Charles E, MD;  [facility]: [facility];  Service: Cardiovascular;  Laterality: Right;  °• AV FISTULA PLACEMENT Left 11/22/2017  ° Procedure: ARTERIOVENOUS (AV) FISTULA CREATION LEFT ARM;  Surgeon: Fields, Charles E, MD;  Location: [facility] OR;  Service: Vascular;  Laterality: Left;  °• AV FISTULA PLACEMENT Right 04/04/2020  ° Procedure: RIGHT ARM ARTERIOVENOUS FISTULA CREATION;  Surgeon: Early, Todd F, MD;  Location: [facility] OR[facility];  Service: Vascular;  Laterality: Right;  °• AV FISTULA PLACEMENT Right 08/20/2020  ° Procedure: ARTERIOVENOUS (AV) FISTULA LIGATION RIGHT ARM;  Surgeon: Fields, Charles E, MD;  Location: [facility] OR;  Service: Vascular;  Laterality: Right;  °• BIOPSY  08/07/2016  ° Procedure: BIOPSY;  Surgeon: Rourk, Robert M, MD;  Location: [facility];  Service: Endoscopy;;  gastric ulcer biopsy  °• COLONOSCOPY    °• ESOPHAGOGASTRODUODENOSCOPY N/A 08/07/2016  ° LA Grade A esophagitis   esophagitis s/p dilation, small hiatal hernia, multiple gastric ulcers and erosions, duodenal erosions s/p biopsy. Negative H.pylori    ESOPHAGOGASTRODUODENOSCOPY N/A 11/27/2016   normal esophagus, previously noted gastric ulcers completely healed, normal duodenum.    ESOPHAGOGASTRODUODENOSCOPY (EGD) WITH PROPOFOL N/A 07/23/2020   Procedure: ESOPHAGOGASTRODUODENOSCOPY (EGD) WITH PROPOFOL;  Surgeon: Rogene Houston, MD;  [facility];  Service: Endoscopy;  Laterality: N/A;   FISTULA SUPERFICIALIZATION Left 02/14/2018    Procedure: FISTULA SUPERFICIALIZATION LEFT ARM;  Surgeon: Angelia Mould, MD;  Location: [facility] OR;  Service: Vascular;  Laterality: Left;   FRACTURE SURGERY Right    ankle   HOT HEMOSTASIS  07/23/2020   Procedure: HOT HEMOSTASIS (ARGON PLASMA COAGULATION/BICAP);  Surgeon: Rogene Houston, MD;  [facility]: [facility];  Service: Endoscopy;;   INTRAMEDULLARY (IM) NAIL INTERTROCHANTERIC Right 07/12/2020   Procedure: INTRAMEDULLARY (IM) NAIL INTERTROCHANTRIC;  Surgeon: Mordecai Rasmussen, MD;  Location: [facility];  Service: Orthopedics;  Laterality: Right;   MALONEY DILATION N/A 08/07/2016   Procedure: Venia Minks DILATION;  Surgeon: Daneil Dolin, MD;  [facility];  Service: Endoscopy;  Laterality: N/A;   MASTECTOMY, PARTIAL Right    multiple myeloma     PERIPHERAL VASCULAR BALLOON ANGIOPLASTY Left 07/13/2019   Procedure: PERIPHERAL VASCULAR BALLOON ANGIOPLASTY;  Surgeon: Marty Heck, MD;  Location: [place] [facility];  Service: Cardiovascular;  Laterality: Left;  arm fistulogram   PERIPHERAL VASCULAR BALLOON ANGIOPLASTY Right 05/22/2020   Procedure: PERIPHERAL VASCULAR BALLOON ANGIOPLASTY;  Surgeon: Cherre Robins, MD;  Location: [place] [facility];  Service: Cardiovascular;  Laterality: Right;  arm fistula   PERIPHERAL VASCULAR BALLOON ANGIOPLASTY Right 07/05/2020   Procedure: PERIPHERAL VASCULAR BALLOON ANGIOPLASTY;  Surgeon: Elam Dutch, MD;  Location: [place] [facility];  Service: Cardiovascular;  Laterality: Right;  arm fistula   PORT-A-CATH REMOVAL Left 11/22/2017   Procedure: REMOVAL PORT-A-CATH LEFT CHEST;  Surgeon: Elam Dutch, MD;  Location: [place];  Service: Vascular;  Laterality: Left;   RETINAL DETACHMENT SURGERY Right    SCLEROTHERAPY  07/23/2020   Procedure: SCLEROTHERAPY;  Surgeon: Rogene Houston, MD;  [facility]: [facility];  Service: Endoscopy;;   UPPER EXTREMITY VENOGRAPHY N/A 08/22/2020   Procedure: LEFT UPPER & CENTRAL VENOGRAPHY;  Surgeon:  Marty Heck, MD;  Location: [place] [facility] LAB;  Service: Cardiovascular;  Laterality: N/A;    Social History   Socioeconomic History   Marital status: Single    Spouse name: Not on file   Number of children: Not on file   Years of education: Not on file   Highest education level: Not on file  Occupational History   Occupation: retired   Tobacco Use   Smoking status: Never   Smokeless tobacco: Never  Vaping Use   Vaping Use: Never used  Substance and Sexual Activity   Alcohol use: No    Alcohol/week: 0.0 standard drinks   Drug use: No   Sexual activity: Never  Other Topics Concern   Not on file  Social History Narrative   Long term resident of SNF    Social Determinants of Health   Financial Resource Strain: Not on file  Food Insecurity: Not on file  Transportation Needs: Not on file  Physical Activity: Not on file  Stress: Not on file  Social Connections: Not on file  Intimate Partner Violence: Not on file   Family History  Problem Relation Age of Onset   Multiple myeloma Sister    Brain  cancer Sister    Dementia Mother        died at 63   Stroke Mother    Heart failure Mother    Diabetes Mother    Heart disease Father    Prostate cancer Brother    Colon cancer Neg Hx       VITAL SIGNS BP 119/78    Pulse 70    Temp 98 F (36.7 C)    Ht 5' 3" (1.6 m)    Wt 127 lb (57.6 kg)    BMI 22.50 kg/m   Facility-Administered Encounter Medications as of 04/25/2021  Medication   octreotide (SANDOSTATIN LAR) 30 MG IM injection   octreotide (SANDOSTATIN LAR) 30 MG IM injection   Outpatient Encounter Medications as of 04/25/2021  Medication Sig   acetaminophen (TYLENOL) 325 MG tablet Take 650 mg by mouth every 8 (eight) hours.   acyclovir (ZOVIRAX) 200 MG capsule Take 200 mg by mouth in the morning. (0800)   Amino Acids-Protein Hydrolys (FEEDING SUPPLEMENT, PRO-STAT SUGAR FREE 64,) LIQD Take 30 mLs by mouth 3 (three) times daily  with meals. (0800, 1200 & 1800)   anastrozole (ARIMIDEX) 1 MG tablet TAKE 1 TABLET BY MOUTH DAILY   atenolol (TENORMIN) 25 MG tablet Take 1 tablet (25 mg total) by mouth in the morning. (0800)   atorvastatin (LIPITOR) 10 MG tablet Take 10 mg by mouth at bedtime. (2000)   Balsam [place]-Castor Oil (VENELEX) OINT Apply topically. Apply to bilateral buttocks and sacrum every shift.   Calcium Acetate 667 MG TABS Take 2 tablets by mouth 3 (three) times daily. 8 am, 5 pm, and 8 pm   calcium-vitamin D (OSCAL WITH D) 500-200 MG-UNIT tablet Take 1 tablet by mouth in the morning. (0800)   cloNIDine (CATAPRES) 0.1 MG tablet Take 1 tablet (0.1 mg total) by mouth at bedtime. (2000) Hold for SBP < 110 bpm   insulin glargine (LANTUS SOLOSTAR) 100 UNIT/ML Solostar Pen Inject 5 Units into the skin at bedtime. (2000)   melatonin 3 MG TABS tablet Take 6 mg by mouth at bedtime. (2000) For Sleep   multivitamin (RENA-VIT) TABS tablet Take 1 tablet by mouth at bedtime. (2100)   NON FORMULARY Diet-Regular with thin liquids   ondansetron (ZOFRAN) 4 MG tablet Take 1 tablet (4 mg total) by mouth every 6 (six) hours as needed for nausea.   pantoprazole (PROTONIX) 40 MG tablet Take 40 mg by mouth 2 (two) times daily.   sertraline (ZOLOFT) 25 MG tablet Take 25 mg by mouth daily. At 9 am along with 50 mg tablet for a total of 75 mg daily   sertraline (ZOLOFT) 50 MG tablet Take 50 mg by mouth daily. At 9 am along with 25 mg tablet for a total of 75 mg daily   sucralfate (CARAFATE) 1 GM/10ML suspension Take 1 g by mouth in the morning, at noon, in the evening, and at bedtime. (0800, 1200, 1700 & 2100)   [DISCONTINUED] ammonium lactate (AMLACTIN) 12 % cream Apply 1 application topically at bedtime.     SIGNIFICANT DIAGNOSTIC EXAMS  PREVIOUS   07-11-20: chest x-ray:  Central catheter tip at cavoatrial junction. No pneumothorax. Mild left base atelectasis. No edema or airspace opacity. Stable cardiac silhouette.  Aortic Atherosclerosis   07-11-20: right knee x-ray:  No acute fracture or dislocation is noted. Diffuse vascular calcifications are seen. No soft tissue abnormality is noted.  07-11-20: right hip x-ray:  Acute, impacted, angulated right basicervical femoral neck fracture.  upper endoscopy:  °Normal hypopharynx. °- Normal esophagus. °- Z-line irregular, 34 cm from the incisors. °- Non-bleeding gastric ulcers with no stigmata of bleeding. °- Duodenal ulcer with stigmata of bleed/ pigmented material. Gold probe therapy was not °successful and hemostasis achieved with injection of dilute epinephrine. °- Non-bleeding duodenal ulcer with no stigmata of bleeding(second bulbar ulcer) °- Normal second portion of the duodenum. °- No specimens collected. ° °07-23-20: chest x-ray:No active disease. Aortic Atherosclerosis   ° °NO NEW EXAMS.  ° °LABS REVIEWED PREVIOUS °  °05-21-20; wbc 5.1; hgb 10.4; hct 32.8; mcv 106.8 plt 303; glucose 186; bun 41; creat 1.54; k+ 5.0; na++ 132; ca 8.4 GFR 5; liver normal albumin 3.7 °06-13-20: chol 189; ldl 106; trig 211; hdl 41; hgb a1c 6.2   °07-11-20: wbc 9.1; hgb 10.6; hct 34.6; mcv 109.1 plt 248; glucose 79; bun 45; creat 7.71 k+ 4.9; na++ 134; ca 8.1 GFR 5; liver normal albumin 3.5 mag 2.4 °07-13-20: wbc 11.4; hgb 8.9; hct 29.7; mcv 108.8 plt 303; glucose 178; bun 66; creat 11.17; k+ 6.6; na++ 135; ca 8.2; GFR 3 °07-15-20: wbc 11.0; hgb 7.5; hct 25.2; mcv 108.2 plt 251; glucose 117; bun 60; creat 9.41; k+ 5.5; na++ 138; ca 8.4 GFR 4 °07-17-20: wbc 11.5; hgb 9.4; hct 29.8; mcv 100.3 plt 300; glucose 140; bun 68; creat 8.04; k+ 4.2; na++ 135; ca 8.5 GFR 5 °07-22-20: wbc 11.6; hgb 7.1; hct 23.5; mcv 104.0 plt 444; glucose 214; bun 97; creat 9.18; k+ 4.5; na++ 140; ca 8.0; GFR4 °07-24-20: wbc 14.3; hgb 6.7; hct 21.2; mcv 99.5 plt 385; glucose 96; bun 49; creat 6.55; k+ 4.7; na++ 133; ca 8.0; GFR4; mag 2.0 phos 3.8 °07-26-20: wbc 10.4; hgb 8.5; hct 26.8; mcv 98.9 plt 430; glucose 111; bun  40;creat 6.90; k+ 4.2; na++ 133; ca 8.5; GFR 6; mag 2.2; phos 3.3    °07-30-20: wbc 7.6; hgb 7.3; hct 24.1; mcv 103.4 plt 345; glucose 97; bun 26; creat 5.89; k+ 4.1; na++ 139; ca 8.6; GFR 7; liver normal albumin 2.8.  °09-03-20: wbc 5.8; hgb 11.2; hct 37.3; mcv 102.8 plt 274; glucose 175; bun 38; creat 5.72; k+ 3.1; na++ 138; ca 9.2 GFR 7 alk phos 138; albumin 3.4 mag 2.0 °09-30-20: wbc 5.6; hgb 13.1; hct 42.6; mcv 97.9 plt 260; glucose 92; bun 23; creat 4.18; k+ 3.1; na++ 134; ca 8.7; GFR 10; liver normal albumin 3.8 °10-29-20: wbc 5.3; hgb 12.1; hct 40.0; mcv 96.6 plt 218; glucose 190; bun 54; creat 7.35 k+ 4.0; na++ 134; ca 9.4; GFR 5 liver normal albumin 3.7; mag 2.3 °11-19-20: wbc 10.9; hgb 11.3; hct 37.2; mcv 96.6 plt 242; glucose 106; bun 47; creat 6.58; k+ 3.9; na++ 133; ca 9.0; GFR 6 liver normal albumin 3.5  °12-02-20: hgb a1c 5.6; chol 167; ldl 83; trig 229; hdl 38 °12-03-20; wbc 5.2; hgb 11.1; hct 35.7; mcv 96.5 plt 320; glucose 139; bun 47; creat 6.05; k+ 4.1; na++ 134; ca 9.1; GFR 6; liver normal albumin 3.8 mag 2.4  °12-17-20: wbc 5.2; hgb 10.5; hct 33.8; mcv 98.8 plt 256; glucose 136; bun 43; creat 6.19; k+ 3.6; na++ 137; ca 8.9; GFR 6 liver normal albumin 3.9  °03-06-21: wbc 4.7; hgb 12.3; hct 38.2; mcv 101.6 plt 221; glucose 205; bun 51; creat 7.06; k+ 4.5; na++ 133; ca 9.1; GFR 5; liver normal albumin 3.8  ° °TODAY ° °03-20-21: wbc 5.4; hgb 12.1; hct 37.7; mcv 97.2 plt 254; glucose 111; bun 47; creat 7.34;   k+ 4.6; na++ 131; ca 9.6 GFR 5; liver normal albumin 3.9; mag 2.2  ° °Review of Systems  °Constitutional:  Negative for malaise/fatigue.  °Respiratory:  Negative for cough and shortness of breath.   °Cardiovascular:  Negative for chest pain, palpitations and leg swelling.  °Gastrointestinal:  Negative for abdominal pain, constipation and heartburn.  °Musculoskeletal:  Negative for back pain, joint pain and myalgias.  °Skin: Negative.   °Neurological:  Negative for dizziness.  °Psychiatric/Behavioral:   The patient is not nervous/anxious.   ° °Physical Exam °Constitutional:   °   General: She is not in acute distress. °   Appearance: She is well-developed. She is not diaphoretic.  °Neck:  °   Thyroid: No thyromegaly.  °Cardiovascular:  °   Rate and Rhythm: Normal rate and regular rhythm.  °   Pulses: Normal pulses.  °   Heart sounds: Normal heart sounds.  °Pulmonary:  °   Effort: Pulmonary effort is normal. No respiratory distress.  °   Breath sounds: Normal breath sounds.  °Abdominal:  °   General: Bowel sounds are normal. There is no distension.  °   Palpations: Abdomen is soft.  °   Tenderness: There is no abdominal tenderness.  °Musculoskeletal:  °   Cervical back: Neck supple.  °   Right lower leg: No edema.  °   Left lower leg: No edema.  °   Comments: Right hip IM nail 07-12-20    °Is able to move all extremities   °Lymphadenopathy:  °   Cervical: No cervical adenopathy.  °Skin: °   General: Skin is warm and dry.  °   Comments:  Dialysis access on chest wall  °Neurological:  °   Mental Status: She is alert. Mental status is at baseline.  °Psychiatric:     °   Mood and Affect: Mood normal.  ° °  ° °ASSESSMENT/ PLAN: ° °TODAY ° °Herpes: without outbreak; will continue acyclovir 200 mg daily  ° °2. Major depression chronic recurrent: is stable will continue zoloft 75 mg daily  ° °3. Protein calorie malnutrition severe: is stable albumin 3.9 will continue supplements as directed ° °4. Multiple gastric ulcers: duodenal ulcer; upper GI bleed; esophageal reflux disease without esophagitis: is stable will continue protonix 40 mg twice daily and carafate 1 gm four times daily  ° ° °PREVIOUS  ° °5. Closed right hip fracture sequela: is stable is status right hip nail 07-12-20: will continue tylenol 605 mg three times daily  ° °6. Chronic kidney disease with end stage renal disease on dialysis due to type 2 diabetes mellitus/dependence on dialysis: is stable is on hemodialysis three days weekly 1200 cc fluid restriction;  will continue calcium acetate: 1334 mg with breakfast and supper 667 mg with lunch and 667 mg with snacks ° °7. Hypertension due to end stage renal disease (ESRD) due to type 2 diabetes mellitus is stable b/p 120/66 norvasc 10 mg daily tenormin 25 mg daily clonidine 0.1 mg daily  ° °8. Hyperlipidemia associated with type 2 diabetes mellitus: is stable LDL 83 will continue lipitor 10 mg daily  ° °9.  Aortic atherosclerosis/atherosclerotic peripheral vascular disease: is stable (ct7-28-20) ° °10. Vascular dementia with behavioral disturbance: is without change weight is 139 pounds will monitor  ° °11. Diabetes mellitus type 2 with end stage renal disease (ESRD) is having low AM cbg readings; hgb a1c 5.8 will lower lantus to 5 units nightly results of A1C are pending  ° °12.   Anemia due to end stage renal disease: is stable hgb 12.1; will monitor ° °13. Stage 1 infiltrating ductal carcinoma of female right breast/status post mastectomy on right. Will continue arimidex 1 mg daily  ° °14. Multiple myeloma not having achieved remission: is followed by oncology  ° ° ° ° °  NP °[facility]  ° [phone]  ° °This encounter was created in error - please disregard. °

## 2021-05-01 ENCOUNTER — Ambulatory Visit (HOSPITAL_COMMUNITY): Payer: Medicare PPO

## 2021-05-01 ENCOUNTER — Non-Acute Institutional Stay (SKILLED_NURSING_FACILITY): Payer: Medicare PPO | Admitting: Internal Medicine

## 2021-05-01 ENCOUNTER — Ambulatory Visit (HOSPITAL_COMMUNITY): Payer: Medicare PPO | Admitting: Hematology

## 2021-05-01 ENCOUNTER — Encounter: Payer: Self-pay | Admitting: Internal Medicine

## 2021-05-01 ENCOUNTER — Other Ambulatory Visit (HOSPITAL_COMMUNITY): Payer: Medicare PPO

## 2021-05-01 DIAGNOSIS — D631 Anemia in chronic kidney disease: Secondary | ICD-10-CM | POA: Diagnosis not present

## 2021-05-01 DIAGNOSIS — F339 Major depressive disorder, recurrent, unspecified: Secondary | ICD-10-CM | POA: Diagnosis not present

## 2021-05-01 DIAGNOSIS — N186 End stage renal disease: Secondary | ICD-10-CM

## 2021-05-01 DIAGNOSIS — E1122 Type 2 diabetes mellitus with diabetic chronic kidney disease: Secondary | ICD-10-CM | POA: Diagnosis not present

## 2021-05-01 DIAGNOSIS — E43 Unspecified severe protein-calorie malnutrition: Secondary | ICD-10-CM

## 2021-05-01 NOTE — Patient Instructions (Signed)
See assessment and plan under each diagnosis in the problem list and acutely for this visit 

## 2021-05-01 NOTE — Assessment & Plan Note (Signed)
Her dementia makes it difficult to assess any depression.  Clinically she does not appear to be medically depressed.  She does confabulate.  No behavioral issues reported by staff.

## 2021-05-01 NOTE — Assessment & Plan Note (Addendum)
Serially anemia is stable to improved with current H/H of 11/34.6.  Indices are minimally macrocytic with an MCV of 100.9. B12 level was 1126 in August 2021

## 2021-05-01 NOTE — Assessment & Plan Note (Signed)
Clinically she appears adequately nourished.  Total protein and albumin are both low normal.

## 2021-05-01 NOTE — Progress Notes (Signed)
NURSING HOME LOCATION:  [facility] ROOM NUMBER: 148 D CODE STATUS: DNR  PCP: Ok Edwards, NP  This is a nursing facility follow up visit of chronic medical diagnoses & to document compliance with Regulation 483.30 (c) in [employer] Phase 2 which mandates caregiver visit ( visits can alternate among physician, PA or NP as per statutes) within 10 days of 30 days / 60 days/ 90 days post admission to SNF date    Interim medical record and care since last SNF visit was updated with review of diagnostic studies and change in clinical status since last visit were documented.  HPI: She is a permanent resident of this facility with diagnoses of multiple myeloma, diabetes with CKD stage IV on dialysis, dementia, dysphagia, GERD, essential hypertension, stage I infiltrating ductal carcinoma of the breast, and glaucoma.  She has screened positive for COVID-19 and is on  Molnupiravir presently.  D-dimer was 1.25 and CRP 4.  White count was 5400 with normal differential and H/H 11/34.6.  Indices are macrocytic with an MCV of 100.9. The anemia is stable to improved serially.  A1c is current and prediabetic at 6.1%.  Fasting blood sugars range from 75-125 with an average of 93. Most recent creatinine was 7.05 with a GFR of 5.  Total protein and albumin are normal at 6.7 and 3.7 respectively.  Review of systems: Dementia invalidated responses. She confabulated about " pain in knees & ankles since falling a long time ago ". She asked me to lower her bed after I had already done so at her request.  She validates some cough intermittently drinking fluids; she denies food dysphagia. She has no symptoms of COVID.  Constitutional: No fever, significant weight change, fatigue  Eyes: No redness, discharge, pain, vision change ENT/mouth: No nasal congestion,  purulent discharge, earache, change in hearing, sore throat  Cardiovascular: No chest pain, palpitations, paroxysmal  nocturnal dyspnea, claudication, edema  Respiratory: No  sputum production, hemoptysis, DOE, significant snoring, apnea   Gastrointestinal: No heartburn, abdominal pain, nausea /vomiting, rectal bleeding, melena, change in bowels Genitourinary: No dysuria, hematuria, pyuria, incontinence, nocturia Dermatologic: No rash, pruritus, change in appearance of skin Neurologic: No dizziness, headache, syncope, seizures, numbness, tingling Psychiatric: No significant anxiety, depression, insomnia, anorexia Endocrine: No change in hair/skin/nails, excessive thirst, excessive hunger, excessive urination  Hematologic/lymphatic: No significant bruising, lymphadenopathy, abnormal bleeding Allergy/immunology: No itchy/watery eyes, significant sneezing, urticaria, angioedema  Physical exam:  Pertinent or positive findings: Her voice is somewhat hoarse.  Eyebrows are thin.  Dialysis port present @ left upper chest.  Respirations  are shallow.  Heart sounds are distant.  She has a short systolic murmur with increased S2.  Abdomen is protuberant.  Trace edema at the sock line.  Pedal pulses are decreased.  I cannot appreciate any significant effusion of the knees.  General appearance: Adequately nourished; no acute distress, increased work of breathing is present.  Lymphatic: No lymphadenopathy about the head, neck, axilla. Eyes: No conjunctival inflammation or lid edema is present. There is no scleral icterus. Ears:  External ear exam shows no significant lesions or deformities.   Nose:  External nasal examination shows no deformity or inflammation. Nasal mucosa are pink and moist without lesions, exudates Oral exam:  Lips and gums are healthy appearing. There is no oropharyngeal erythema or exudate. Neck:  No thyromegaly, masses, tenderness noted.    Heart:  No gallop, murmur, click, rub .  Lungs: without wheezes, rhonchi, rales,  rubs. Abdomen: Bowel sounds are normal. Abdomen is soft and nontender with no  organomegaly, hernias, masses. GU: Deferred  Extremities:  No cyanosis, clubbing Neurologic exam :Balance, Rhomberg, finger to nose testing could not be completed due to clinical state Skin: Warm & dry w/o tenting. No significant lesions or rash.  See summary under each active problem in the Problem List with associated updated therapeutic plan

## 2021-05-01 NOTE — Assessment & Plan Note (Addendum)
Fasting blood sugars range from a low of 75 up to 125 with an average of 93.  A1c is 6.1%.  With end-stage renal disease discordance can occur but is not suggested here. HD M , W & F

## 2021-05-02 DIAGNOSIS — N2581 Secondary hyperparathyroidism of renal origin: Secondary | ICD-10-CM | POA: Diagnosis not present

## 2021-05-02 DIAGNOSIS — N186 End stage renal disease: Secondary | ICD-10-CM | POA: Diagnosis not present

## 2021-05-02 DIAGNOSIS — Z992 Dependence on renal dialysis: Secondary | ICD-10-CM | POA: Diagnosis not present

## 2021-05-02 DIAGNOSIS — U071 COVID-19: Secondary | ICD-10-CM | POA: Insufficient documentation

## 2021-05-05 DIAGNOSIS — N2581 Secondary hyperparathyroidism of renal origin: Secondary | ICD-10-CM | POA: Diagnosis not present

## 2021-05-05 DIAGNOSIS — Z992 Dependence on renal dialysis: Secondary | ICD-10-CM | POA: Diagnosis not present

## 2021-05-05 DIAGNOSIS — N186 End stage renal disease: Secondary | ICD-10-CM | POA: Diagnosis not present

## 2021-05-06 ENCOUNTER — Ambulatory Visit (HOSPITAL_COMMUNITY): Payer: Medicare PPO

## 2021-05-06 ENCOUNTER — Other Ambulatory Visit (HOSPITAL_COMMUNITY): Payer: Medicare PPO

## 2021-05-06 ENCOUNTER — Ambulatory Visit (HOSPITAL_COMMUNITY): Payer: Medicare PPO | Admitting: Hematology

## 2021-05-06 DIAGNOSIS — Z992 Dependence on renal dialysis: Secondary | ICD-10-CM | POA: Diagnosis not present

## 2021-05-06 DIAGNOSIS — N186 End stage renal disease: Secondary | ICD-10-CM | POA: Diagnosis not present

## 2021-05-07 DIAGNOSIS — N2581 Secondary hyperparathyroidism of renal origin: Secondary | ICD-10-CM | POA: Diagnosis not present

## 2021-05-07 DIAGNOSIS — N186 End stage renal disease: Secondary | ICD-10-CM | POA: Diagnosis not present

## 2021-05-07 DIAGNOSIS — Z992 Dependence on renal dialysis: Secondary | ICD-10-CM | POA: Diagnosis not present

## 2021-05-09 DIAGNOSIS — Z992 Dependence on renal dialysis: Secondary | ICD-10-CM | POA: Diagnosis not present

## 2021-05-09 DIAGNOSIS — N2581 Secondary hyperparathyroidism of renal origin: Secondary | ICD-10-CM | POA: Diagnosis not present

## 2021-05-09 DIAGNOSIS — N186 End stage renal disease: Secondary | ICD-10-CM | POA: Diagnosis not present

## 2021-05-12 DIAGNOSIS — N2581 Secondary hyperparathyroidism of renal origin: Secondary | ICD-10-CM | POA: Diagnosis not present

## 2021-05-12 DIAGNOSIS — Z992 Dependence on renal dialysis: Secondary | ICD-10-CM | POA: Diagnosis not present

## 2021-05-12 DIAGNOSIS — N186 End stage renal disease: Secondary | ICD-10-CM | POA: Diagnosis not present

## 2021-05-13 ENCOUNTER — Inpatient Hospital Stay (HOSPITAL_COMMUNITY): Payer: Medicare PPO

## 2021-05-13 ENCOUNTER — Ambulatory Visit (HOSPITAL_COMMUNITY): Payer: Medicare PPO | Admitting: Hematology

## 2021-05-13 ENCOUNTER — Inpatient Hospital Stay (HOSPITAL_COMMUNITY): Payer: Medicare PPO | Attending: Hematology

## 2021-05-13 VITALS — BP 109/62 | HR 87 | Temp 98.7°F | Resp 18 | Ht 63.0 in | Wt 130.6 lb

## 2021-05-13 DIAGNOSIS — C9000 Multiple myeloma not having achieved remission: Secondary | ICD-10-CM | POA: Diagnosis not present

## 2021-05-13 DIAGNOSIS — Z5111 Encounter for antineoplastic chemotherapy: Secondary | ICD-10-CM | POA: Diagnosis not present

## 2021-05-13 LAB — CBC WITH DIFFERENTIAL/PLATELET
Abs Immature Granulocytes: 0.02 10*3/uL (ref 0.00–0.07)
Basophils Absolute: 0.1 10*3/uL (ref 0.0–0.1)
Basophils Relative: 1 %
Eosinophils Absolute: 0.1 10*3/uL (ref 0.0–0.5)
Eosinophils Relative: 2 %
HCT: 33.1 % — ABNORMAL LOW (ref 36.0–46.0)
Hemoglobin: 10.6 g/dL — ABNORMAL LOW (ref 12.0–15.0)
Immature Granulocytes: 0 %
Lymphocytes Relative: 15 %
Lymphs Abs: 1 10*3/uL (ref 0.7–4.0)
MCH: 32.5 pg (ref 26.0–34.0)
MCHC: 32 g/dL (ref 30.0–36.0)
MCV: 101.5 fL — ABNORMAL HIGH (ref 80.0–100.0)
Monocytes Absolute: 0.6 10*3/uL (ref 0.1–1.0)
Monocytes Relative: 9 %
Neutro Abs: 4.8 10*3/uL (ref 1.7–7.7)
Neutrophils Relative %: 73 %
Platelets: 184 10*3/uL (ref 150–400)
RBC: 3.26 MIL/uL — ABNORMAL LOW (ref 3.87–5.11)
RDW: 17.2 % — ABNORMAL HIGH (ref 11.5–15.5)
WBC: 6.5 10*3/uL (ref 4.0–10.5)
nRBC: 0 % (ref 0.0–0.2)

## 2021-05-13 LAB — COMPREHENSIVE METABOLIC PANEL
ALT: 14 U/L (ref 0–44)
AST: 19 U/L (ref 15–41)
Albumin: 3.7 g/dL (ref 3.5–5.0)
Alkaline Phosphatase: 72 U/L (ref 38–126)
Anion gap: 15 (ref 5–15)
BUN: 39 mg/dL — ABNORMAL HIGH (ref 8–23)
CO2: 24 mmol/L (ref 22–32)
Calcium: 9.1 mg/dL (ref 8.9–10.3)
Chloride: 97 mmol/L — ABNORMAL LOW (ref 98–111)
Creatinine, Ser: 6.44 mg/dL — ABNORMAL HIGH (ref 0.44–1.00)
GFR, Estimated: 6 mL/min — ABNORMAL LOW (ref >60)
Glucose, Bld: 201 mg/dL — ABNORMAL HIGH (ref 70–99)
Potassium: 3.9 mmol/L (ref 3.5–5.1)
Sodium: 136 mmol/L (ref 135–145)
Total Bilirubin: 0.4 mg/dL (ref 0.3–1.2)
Total Protein: 7 g/dL (ref 6.5–8.1)

## 2021-05-13 LAB — MAGNESIUM: Magnesium: 2.1 mg/dL (ref 1.7–2.4)

## 2021-05-13 MED ORDER — PROCHLORPERAZINE MALEATE 10 MG PO TABS
10.0000 mg | ORAL_TABLET | Freq: Once | ORAL | Status: AC
  Administered 2021-05-13: 10 mg via ORAL
  Filled 2021-05-13: qty 1

## 2021-05-13 MED ORDER — DEXAMETHASONE 4 MG PO TABS
10.0000 mg | ORAL_TABLET | Freq: Once | ORAL | Status: AC
  Administered 2021-05-13: 10 mg via ORAL
  Filled 2021-05-13: qty 3

## 2021-05-13 MED ORDER — BORTEZOMIB CHEMO SQ INJECTION 3.5 MG (2.5MG/ML)
1.3000 mg/m2 | Freq: Once | INTRAMUSCULAR | Status: AC
  Administered 2021-05-13: 2 mg via SUBCUTANEOUS
  Filled 2021-05-13: qty 0.8

## 2021-05-13 NOTE — Progress Notes (Signed)
Carly Wood presents today for Velcade injection per the provider's orders.  Stable during administration without incident; injection site WNL; see MAR for injection details.  Patient tolerated procedure well and without incident.  No questions or complaints noted at this time. Discharge from clinic via wheelchair in stable condition.  Alert and oriented X 3.  Follow up with [facility] as scheduled.  ?

## 2021-05-13 NOTE — Patient Instructions (Signed)
[facility]  Discharge Instructions: ?Thank you for choosing [facility] to provide your oncology and hematology care.  ?If you have a lab appointment with the [facility], please come in thru the Main Entrance and check in at the main information desk. ? ?Wear comfortable clothing and clothing appropriate for easy access to any Portacath or PICC line.  ? ?We strive to give you quality time with your provider. You may need to reschedule your appointment if you arrive late (15 or more minutes).  Arriving late affects you and other patients whose appointments are after yours.  Also, if you miss three or more appointments without notifying the office, you may be dismissed from the clinic at the provider?s discretion.    ?  ?For prescription refill requests, have your pharmacy contact our office and allow 72 hours for refills to be completed.   ? ?Today you received the following chemotherapy and/or immunotherapy agents Velcade    ?  ?To help prevent nausea and vomiting after your treatment, we encourage you to take your nausea medication as directed. ? ?BELOW ARE SYMPTOMS THAT SHOULD BE REPORTED IMMEDIATELY: ?*FEVER GREATER THAN 100.4 F (38 ?C) OR HIGHER ?*CHILLS OR SWEATING ?*NAUSEA AND VOMITING THAT IS NOT CONTROLLED WITH YOUR NAUSEA MEDICATION ?*UNUSUAL SHORTNESS OF BREATH ?*UNUSUAL BRUISING OR BLEEDING ?*URINARY PROBLEMS (pain or burning when urinating, or frequent urination) ?*BOWEL PROBLEMS (unusual diarrhea, constipation, pain near the anus) ?TENDERNESS IN MOUTH AND THROAT WITH OR WITHOUT PRESENCE OF ULCERS (sore throat, sores in mouth, or a toothache) ?UNUSUAL RASH, SWELLING OR PAIN  ?UNUSUAL VAGINAL DISCHARGE OR ITCHING  ? ?Items with * indicate a potential emergency and should be followed up as soon as possible or go to the Emergency Department if any problems should occur. ? ?Please show the CHEMOTHERAPY ALERT CARD or IMMUNOTHERAPY ALERT CARD at check-in to the Emergency  Department and triage nurse. ? ?Should you have questions after your visit or need to cancel or reschedule your appointment, please contact [facility] [phone]  and follow the prompts.  Office hours are 8:00 a.m. to 4:30 p.m. Monday - Friday. Please note that voicemails left after 4:00 p.m. may not be returned until the following business day.  We are closed weekends and major holidays. You have access to a nurse at all times for urgent questions. Please call the main number to the clinic [phone] and follow the prompts. ? ?For any non-urgent questions, you may also contact your provider using MyChart. We now offer e-Visits for anyone 38 and older to request care online for non-urgent symptoms. For details visit [email or web address]. ?  ?Also download the MyChart app! Go to the app store, search "MyChart", open the app, select , and log in with your MyChart username and password. ? ?Due to Covid, a mask is required upon entering the hospital/clinic. If you do not have a mask, one will be given to you upon arrival. For doctor visits, patients may have 1 support person aged 33 or older with them. For treatment visits, patients cannot have anyone with them due to current Covid guidelines and our immunocompromised population.  ?

## 2021-05-14 DIAGNOSIS — N2581 Secondary hyperparathyroidism of renal origin: Secondary | ICD-10-CM | POA: Diagnosis not present

## 2021-05-14 DIAGNOSIS — Z992 Dependence on renal dialysis: Secondary | ICD-10-CM | POA: Diagnosis not present

## 2021-05-14 DIAGNOSIS — N186 End stage renal disease: Secondary | ICD-10-CM | POA: Diagnosis not present

## 2021-05-14 LAB — KAPPA/LAMBDA LIGHT CHAINS
Kappa free light chain: 162.6 mg/L — ABNORMAL HIGH (ref 3.3–19.4)
Kappa, lambda light chain ratio: 0.48 (ref 0.26–1.65)
Lambda free light chains: 335.9 mg/L — ABNORMAL HIGH (ref 5.7–26.3)

## 2021-05-15 LAB — PROTEIN ELECTROPHORESIS, SERUM
A/G Ratio: 1.2 (ref 0.7–1.7)
Albumin ELP: 3.7 g/dL (ref 2.9–4.4)
Alpha-1-Globulin: 0.3 g/dL (ref 0.0–0.4)
Alpha-2-Globulin: 0.9 g/dL (ref 0.4–1.0)
Beta Globulin: 1.1 g/dL (ref 0.7–1.3)
Gamma Globulin: 0.7 g/dL (ref 0.4–1.8)
Globulin, Total: 3 g/dL (ref 2.2–3.9)
M-Spike, %: 0.3 g/dL — ABNORMAL HIGH
Total Protein ELP: 6.7 g/dL (ref 6.0–8.5)

## 2021-05-16 DIAGNOSIS — N186 End stage renal disease: Secondary | ICD-10-CM | POA: Diagnosis not present

## 2021-05-16 DIAGNOSIS — Z992 Dependence on renal dialysis: Secondary | ICD-10-CM | POA: Diagnosis not present

## 2021-05-16 DIAGNOSIS — N2581 Secondary hyperparathyroidism of renal origin: Secondary | ICD-10-CM | POA: Diagnosis not present

## 2021-05-19 DIAGNOSIS — N2581 Secondary hyperparathyroidism of renal origin: Secondary | ICD-10-CM | POA: Diagnosis not present

## 2021-05-19 DIAGNOSIS — Z992 Dependence on renal dialysis: Secondary | ICD-10-CM | POA: Diagnosis not present

## 2021-05-19 DIAGNOSIS — N186 End stage renal disease: Secondary | ICD-10-CM | POA: Diagnosis not present

## 2021-05-19 LAB — IMMUNOFIXATION ELECTROPHORESIS
IgA: 257 mg/dL (ref 64–422)
IgG (Immunoglobin G), Serum: 710 mg/dL (ref 586–1602)
IgM (Immunoglobulin M), Srm: 8 mg/dL — ABNORMAL LOW (ref 26–217)
Total Protein ELP: 6.6 g/dL (ref 6.0–8.5)

## 2021-05-20 ENCOUNTER — Ambulatory Visit (HOSPITAL_COMMUNITY): Payer: Medicare PPO

## 2021-05-20 ENCOUNTER — Other Ambulatory Visit (HOSPITAL_COMMUNITY): Payer: Medicare PPO

## 2021-05-21 DIAGNOSIS — N2581 Secondary hyperparathyroidism of renal origin: Secondary | ICD-10-CM | POA: Diagnosis not present

## 2021-05-21 DIAGNOSIS — Z992 Dependence on renal dialysis: Secondary | ICD-10-CM | POA: Diagnosis not present

## 2021-05-21 DIAGNOSIS — N186 End stage renal disease: Secondary | ICD-10-CM | POA: Diagnosis not present

## 2021-05-23 DIAGNOSIS — N2581 Secondary hyperparathyroidism of renal origin: Secondary | ICD-10-CM | POA: Diagnosis not present

## 2021-05-23 DIAGNOSIS — Z992 Dependence on renal dialysis: Secondary | ICD-10-CM | POA: Diagnosis not present

## 2021-05-23 DIAGNOSIS — N186 End stage renal disease: Secondary | ICD-10-CM | POA: Diagnosis not present

## 2021-05-26 DIAGNOSIS — N186 End stage renal disease: Secondary | ICD-10-CM | POA: Diagnosis not present

## 2021-05-26 DIAGNOSIS — N2581 Secondary hyperparathyroidism of renal origin: Secondary | ICD-10-CM | POA: Diagnosis not present

## 2021-05-26 DIAGNOSIS — Z992 Dependence on renal dialysis: Secondary | ICD-10-CM | POA: Diagnosis not present

## 2021-05-27 ENCOUNTER — Ambulatory Visit (HOSPITAL_COMMUNITY): Payer: Medicare PPO

## 2021-05-27 ENCOUNTER — Ambulatory Visit (HOSPITAL_COMMUNITY): Payer: Medicare PPO | Admitting: Hematology

## 2021-05-27 ENCOUNTER — Other Ambulatory Visit (HOSPITAL_COMMUNITY): Payer: Medicare PPO

## 2021-05-27 ENCOUNTER — Inpatient Hospital Stay (HOSPITAL_COMMUNITY): Payer: Medicare PPO

## 2021-05-27 VITALS — BP 100/54 | HR 73 | Temp 98.2°F | Resp 17

## 2021-05-27 DIAGNOSIS — C9000 Multiple myeloma not having achieved remission: Secondary | ICD-10-CM

## 2021-05-27 DIAGNOSIS — Z5111 Encounter for antineoplastic chemotherapy: Secondary | ICD-10-CM | POA: Diagnosis not present

## 2021-05-27 LAB — COMPREHENSIVE METABOLIC PANEL
ALT: 13 U/L (ref 0–44)
AST: 19 U/L (ref 15–41)
Albumin: 3.8 g/dL (ref 3.5–5.0)
Alkaline Phosphatase: 92 U/L (ref 38–126)
Anion gap: 14 (ref 5–15)
BUN: 45 mg/dL — ABNORMAL HIGH (ref 8–23)
CO2: 27 mmol/L (ref 22–32)
Calcium: 9.3 mg/dL (ref 8.9–10.3)
Chloride: 97 mmol/L — ABNORMAL LOW (ref 98–111)
Creatinine, Ser: 6.86 mg/dL — ABNORMAL HIGH (ref 0.44–1.00)
GFR, Estimated: 6 mL/min — ABNORMAL LOW (ref >60)
Glucose, Bld: 147 mg/dL — ABNORMAL HIGH (ref 70–99)
Potassium: 4.5 mmol/L (ref 3.5–5.1)
Sodium: 138 mmol/L (ref 135–145)
Total Bilirubin: 0.3 mg/dL (ref 0.3–1.2)
Total Protein: 6.9 g/dL (ref 6.5–8.1)

## 2021-05-27 LAB — CBC WITH DIFFERENTIAL/PLATELET
Abs Immature Granulocytes: 0.02 10*3/uL (ref 0.00–0.07)
Basophils Absolute: 0 10*3/uL (ref 0.0–0.1)
Basophils Relative: 1 %
Eosinophils Absolute: 0.2 10*3/uL (ref 0.0–0.5)
Eosinophils Relative: 3 %
HCT: 33.5 % — ABNORMAL LOW (ref 36.0–46.0)
Hemoglobin: 10.6 g/dL — ABNORMAL LOW (ref 12.0–15.0)
Immature Granulocytes: 0 %
Lymphocytes Relative: 16 %
Lymphs Abs: 1 10*3/uL (ref 0.7–4.0)
MCH: 32 pg (ref 26.0–34.0)
MCHC: 31.6 g/dL (ref 30.0–36.0)
MCV: 101.2 fL — ABNORMAL HIGH (ref 80.0–100.0)
Monocytes Absolute: 0.5 10*3/uL (ref 0.1–1.0)
Monocytes Relative: 8 %
Neutro Abs: 4.3 10*3/uL (ref 1.7–7.7)
Neutrophils Relative %: 72 %
Platelets: 261 10*3/uL (ref 150–400)
RBC: 3.31 MIL/uL — ABNORMAL LOW (ref 3.87–5.11)
RDW: 18.5 % — ABNORMAL HIGH (ref 11.5–15.5)
WBC: 6 10*3/uL (ref 4.0–10.5)
nRBC: 0 % (ref 0.0–0.2)

## 2021-05-27 MED ORDER — PROCHLORPERAZINE MALEATE 10 MG PO TABS
10.0000 mg | ORAL_TABLET | Freq: Once | ORAL | Status: AC
  Administered 2021-05-27: 10 mg via ORAL
  Filled 2021-05-27: qty 1

## 2021-05-27 MED ORDER — BORTEZOMIB CHEMO SQ INJECTION 3.5 MG (2.5MG/ML)
1.3000 mg/m2 | Freq: Once | INTRAMUSCULAR | Status: AC
  Administered 2021-05-27: 2 mg via SUBCUTANEOUS
  Filled 2021-05-27: qty 0.8

## 2021-05-27 MED ORDER — DEXAMETHASONE 6 MG PO TABS
10.0000 mg | ORAL_TABLET | Freq: Once | ORAL | Status: AC
  Administered 2021-05-27: 10 mg via ORAL
  Filled 2021-05-27: qty 1

## 2021-05-27 NOTE — Progress Notes (Signed)
Patient tolerated Velcade injection with no complaints voiced. Lab work reviewed. See MAR for details. Injection site clean and dry with no bruising or swelling noted. Patient stable during and after injection. Band aid applied. VSS. Patient left in satisfactory condition with no s/s of distress noted. 

## 2021-05-27 NOTE — Patient Instructions (Signed)
[facility]  Discharge Instructions: ?Thank you for choosing [facility] to provide your oncology and hematology care.  ?If you have a lab appointment with the [facility], please come in thru the Main Entrance and check in at the main information desk. ? ?Wear comfortable clothing and clothing appropriate for easy access to any Portacath or PICC line.  ? ?We strive to give you quality time with your provider. You may need to reschedule your appointment if you arrive late (15 or more minutes).  Arriving late affects you and other patients whose appointments are after yours.  Also, if you miss three or more appointments without notifying the office, you may be dismissed from the clinic at the provider?s discretion.    ?  ?For prescription refill requests, have your pharmacy contact our office and allow 72 hours for refills to be completed.   ? ?Today you received the following chemotherapy and/or immunotherapy agents velcade.  ?  ?To help prevent nausea and vomiting after your treatment, we encourage you to take your nausea medication as directed. ? ?BELOW ARE SYMPTOMS THAT SHOULD BE REPORTED IMMEDIATELY: ?*FEVER GREATER THAN 100.4 F (38 ?C) OR HIGHER ?*CHILLS OR SWEATING ?*NAUSEA AND VOMITING THAT IS NOT CONTROLLED WITH YOUR NAUSEA MEDICATION ?*UNUSUAL SHORTNESS OF BREATH ?*UNUSUAL BRUISING OR BLEEDING ?*URINARY PROBLEMS (pain or burning when urinating, or frequent urination) ?*BOWEL PROBLEMS (unusual diarrhea, constipation, pain near the anus) ?TENDERNESS IN MOUTH AND THROAT WITH OR WITHOUT PRESENCE OF ULCERS (sore throat, sores in mouth, or a toothache) ?UNUSUAL RASH, SWELLING OR PAIN  ?UNUSUAL VAGINAL DISCHARGE OR ITCHING  ? ?Items with * indicate a potential emergency and should be followed up as soon as possible or go to the Emergency Department if any problems should occur. ? ?Please show the CHEMOTHERAPY ALERT CARD or IMMUNOTHERAPY ALERT CARD at check-in to the Emergency  Department and triage nurse. ? ?Should you have questions after your visit or need to cancel or reschedule your appointment, please contact [facility] [phone]  and follow the prompts.  Office hours are 8:00 a.m. to 4:30 p.m. Monday - Friday. Please note that voicemails left after 4:00 p.m. may not be returned until the following business day.  We are closed weekends and major holidays. You have access to a nurse at all times for urgent questions. Please call the main number to the clinic [phone] and follow the prompts. ? ?For any non-urgent questions, you may also contact your provider using MyChart. We now offer e-Visits for anyone 34 and older to request care online for non-urgent symptoms. For details visit [email or web address] ?  ?Also download the MyChart app! Go to the app store, search "MyChart", open the app, select [facility], and log in with your MyChart username and password. ? ?Due to Covid, a mask is required upon entering the hospital/clinic. If you do not have a mask, one will be given to you upon arrival. For doctor visits, patients may have 1 support person aged 11 or older with them. For treatment visits, patients cannot have anyone with them due to current Covid guidelines and our immunocompromised population.  ?

## 2021-05-28 DIAGNOSIS — N186 End stage renal disease: Secondary | ICD-10-CM | POA: Diagnosis not present

## 2021-05-28 DIAGNOSIS — N2581 Secondary hyperparathyroidism of renal origin: Secondary | ICD-10-CM | POA: Diagnosis not present

## 2021-05-28 DIAGNOSIS — Z992 Dependence on renal dialysis: Secondary | ICD-10-CM | POA: Diagnosis not present

## 2021-05-30 DIAGNOSIS — N2581 Secondary hyperparathyroidism of renal origin: Secondary | ICD-10-CM | POA: Diagnosis not present

## 2021-05-30 DIAGNOSIS — Z992 Dependence on renal dialysis: Secondary | ICD-10-CM | POA: Diagnosis not present

## 2021-05-30 DIAGNOSIS — N186 End stage renal disease: Secondary | ICD-10-CM | POA: Diagnosis not present

## 2021-06-02 DIAGNOSIS — E119 Type 2 diabetes mellitus without complications: Secondary | ICD-10-CM | POA: Diagnosis not present

## 2021-06-02 DIAGNOSIS — N186 End stage renal disease: Secondary | ICD-10-CM | POA: Diagnosis not present

## 2021-06-02 DIAGNOSIS — N2581 Secondary hyperparathyroidism of renal origin: Secondary | ICD-10-CM | POA: Diagnosis not present

## 2021-06-02 DIAGNOSIS — Z992 Dependence on renal dialysis: Secondary | ICD-10-CM | POA: Diagnosis not present

## 2021-06-04 ENCOUNTER — Encounter: Payer: Self-pay | Admitting: Adult Health

## 2021-06-04 ENCOUNTER — Non-Acute Institutional Stay (SKILLED_NURSING_FACILITY): Payer: Medicare PPO | Admitting: Adult Health

## 2021-06-04 DIAGNOSIS — N186 End stage renal disease: Secondary | ICD-10-CM | POA: Diagnosis not present

## 2021-06-04 DIAGNOSIS — K259 Gastric ulcer, unspecified as acute or chronic, without hemorrhage or perforation: Secondary | ICD-10-CM

## 2021-06-04 DIAGNOSIS — Z992 Dependence on renal dialysis: Secondary | ICD-10-CM | POA: Diagnosis not present

## 2021-06-04 DIAGNOSIS — F339 Major depressive disorder, recurrent, unspecified: Secondary | ICD-10-CM

## 2021-06-04 DIAGNOSIS — B009 Herpesviral infection, unspecified: Secondary | ICD-10-CM

## 2021-06-04 DIAGNOSIS — K219 Gastro-esophageal reflux disease without esophagitis: Secondary | ICD-10-CM | POA: Diagnosis not present

## 2021-06-04 DIAGNOSIS — N2581 Secondary hyperparathyroidism of renal origin: Secondary | ICD-10-CM | POA: Diagnosis not present

## 2021-06-04 NOTE — Progress Notes (Signed)
?Location:  [facility] ?Nursing Home Room Number: 148 ?Place of Service:  SNF (31) ?Provider:  Ok Edwards, NP ? ? ?CODE STATUS: DNR ? ?Allergies  ?Allergen Reactions  ? Ace Inhibitors Other (See Comments) and Cough  ?  Tongue swell , ie angioedema  ? Angiotensin Receptor Blockers   ?  Angioedema with ACE-I  ? Penicillins Other (See Comments)  ?  Unsure of reaction ?Has patient had a PCN reaction causing immediate rash, facial/tongue/throat swelling, SOB or lightheadedness with hypotension: Unknown ?Has patient had a PCN reaction causing severe rash involving mucus membranes or skin necrosis: Unknown ?Has patient had a PCN reaction that required hospitalization: No ?Has patient had a PCN reaction occurring within the last 10 years: Unknown ?If all of the above answers are "NO", then may proceed with Cephalosporin use. ? ?  ? Penicillin G   ? ? ?Chief Complaint  ?Patient presents with  ? Medical Management of Chronic Issues  ?                          Herpes:  Major depression chronic recurrent:  Protein calorie malnutrition, severe:  Multiple gastric ulcers: duodenal ulcer; upper GI bleed; esophageal reflux disease without esophagitis  ? ? ?HPI: ? ?She is a 86 year old long term resident of this facility being seen for the management of her chronic illnesses: Herpes:  Major depression chronic recurrent:  Protein calorie malnutrition, severe:  Multiple gastric ulcers: duodenal ulcer; upper GI bleed; esophageal reflux disease without esophagitis. There are no reports of uncontrolled pain. No reports changes in appetite; weight is fairly stable. No reports of anxiety or depressive thoughts.  ? ?Past Medical History:  ?Diagnosis Date  ? Anemia   ?  chronic macrocytic anemia  ? Anxiety   ? Chronic kidney disease   ? Chronic renal disease, stage 4, severely decreased glomerular filtration rate (GFR) between 15-29 mL/min/1.73 square meter (HCC) 08/22/2015  ? Complication of anesthesia   ? delirious after  Breast Surgery  ? Dementia ([place])   ? mild  ? Depression   ? Diabetes mellitus with ESRD (end-stage renal disease) ([place])   ? type II  ? Dysphagia   ? Dyspnea   ? with activity  ? GERD (gastroesophageal reflux disease)   ? Glaucoma   ? Hyperlipidemia   ? Hypertension   ? Pneumonia   ? Stage 1 infiltrating ductal carcinoma of right female breast ([place]) 08/21/2015  ? ER+ PR+ HER 2 neu + (3+) T1cN0   ? ? ?Past Surgical History:  ?Procedure Laterality Date  ? A/V FISTULAGRAM Right 07/05/2020  ? Procedure: A/V Fistulagram;  Surgeon: Elam Dutch, MD;  Location: [facility];  Service: Cardiovascular;  Laterality: Right;  ? AV FISTULA PLACEMENT Left 11/22/2017  ? Procedure: ARTERIOVENOUS (AV) FISTULA CREATION LEFT ARM;  Surgeon: Elam Dutch, MD;  Location: [facility] OR;  Service: Vascular;  Laterality: Left;  ? AV FISTULA PLACEMENT Right 04/04/2020  ? Procedure: RIGHT ARM ARTERIOVENOUS FISTULA CREATION;  Surgeon: Rosetta Posner, MD;  [facility]: [facility];  Service: Vascular;  Laterality: Right;  ? AV FISTULA PLACEMENT Right 08/20/2020  ? Procedure: ARTERIOVENOUS (AV) FISTULA LIGATION RIGHT ARM;  Surgeon: Elam Dutch, MD;  Location: [facility];  Service: Vascular;  Laterality: Right;  ? BIOPSY  08/07/2016  ? Procedure: BIOPSY;  Surgeon: Daneil Dolin, MD;  [facility];  Service: Endoscopy;;  gastric ulcer biopsy  ?  COLONOSCOPY    ? ESOPHAGOGASTRODUODENOSCOPY N/A 08/07/2016  ? LA Grade A esophagitis s/p dilation, small hiatal hernia, multiple gastric ulcers and erosions, duodenal erosions s/p biopsy. Negative H.pylori   ? ESOPHAGOGASTRODUODENOSCOPY N/A 11/27/2016  ? normal esophagus, previously noted gastric ulcers completely healed, normal duodenum.   ? ESOPHAGOGASTRODUODENOSCOPY (EGD) WITH PROPOFOL N/A 07/23/2020  ? Procedure: ESOPHAGOGASTRODUODENOSCOPY (EGD) WITH PROPOFOL;  Surgeon: Rogene Houston, MD;  [facility];  Service: Endoscopy;  Laterality: N/A;  ? FISTULA SUPERFICIALIZATION Left 02/14/2018   ? Procedure: FISTULA SUPERFICIALIZATION LEFT ARM;  Surgeon: Angelia Mould, MD;  Location: [facility];  Service: Vascular;  Laterality: Left;  ? FRACTURE SURGERY Right   ? ankle  ? HOT HEMOSTASIS  07/23/2020  ? Procedure: HOT HEMOSTASIS (ARGON PLASMA COAGULATION/BICAP);  Surgeon: Rogene Houston, MD;  [facility];  Service: Endoscopy;;  ? INTRAMEDULLARY (IM) NAIL INTERTROCHANTERIC Right 07/12/2020  ? Procedure: INTRAMEDULLARY (IM) NAIL INTERTROCHANTRIC;  Surgeon: Mordecai Rasmussen, MD;  [facility];  Service: Orthopedics;  Laterality: Right;  ? MALONEY DILATION N/A 08/07/2016  ? Procedure: MALONEY DILATION;  Surgeon: Daneil Dolin, MD;  [facility];  Service: Endoscopy;  Laterality: N/A;  ? MASTECTOMY, PARTIAL Right   ? multiple myeloma    ? PERIPHERAL VASCULAR BALLOON ANGIOPLASTY Left 07/13/2019  ? Procedure: PERIPHERAL VASCULAR BALLOON ANGIOPLASTY;  Surgeon: Marty Heck, MD;  Location: [facility] CV LAB;  Service: Cardiovascular;  Laterality: Left;  arm fistulogram  ? PERIPHERAL VASCULAR BALLOON ANGIOPLASTY Right 05/22/2020  ? Procedure: PERIPHERAL VASCULAR BALLOON ANGIOPLASTY;  Surgeon: Cherre Robins, MD;  Location: [facility] CV LAB;  Service: Cardiovascular;  Laterality: Right;  arm fistula  ? PERIPHERAL VASCULAR BALLOON ANGIOPLASTY Right 07/05/2020  ? Procedure: PERIPHERAL VASCULAR BALLOON ANGIOPLASTY;  Surgeon: Elam Dutch, MD;  Location: [facility] CV LAB;  Service: Cardiovascular;  Laterality: Right;  arm fistula  ? PORT-A-CATH REMOVAL Left 11/22/2017  ? Procedure: REMOVAL PORT-A-CATH LEFT CHEST;  Surgeon: Elam Dutch, MD;  Location: [facility] OR;  Service: Vascular;  Laterality: Left;  ? RETINAL DETACHMENT SURGERY Right   ? SCLEROTHERAPY  07/23/2020  ? Procedure: SCLEROTHERAPY;  Surgeon: Rogene Houston, MD;  [facility];  Service: Endoscopy;;  ? UPPER EXTREMITY VENOGRAPHY N/A 08/22/2020  ? Procedure: LEFT UPPER & CENTRAL VENOGRAPHY;  Surgeon: Marty Heck, MD;  Location: [facility] CV LAB;  Service: Cardiovascular;  Laterality: N/A;  ? ? ?Social History  ? ?Socioeconomic History  ? Marital status: Single  ?  Spouse name: Not on file  ? Number of children: Not on file  ? Years of education: Not on file  ? Highest education level: Not on file  ?Occupational History  ? Occupation: retired   ?Tobacco Use  ? Smoking status: Never  ? Smokeless tobacco: Never  ?Vaping Use  ? Vaping Use: Never used  ?Substance and Sexual Activity  ? Alcohol use: No  ?  Alcohol/week: 0.0 standard drinks  ? Drug use: No  ? Sexual activity: Never  ?Other Topics Concern  ? Not on file  ?Social History Narrative  ? Long term resident of SNF   ? ?Social Determinants of Health  ? ?Financial Resource Strain: Not on file  ?Food Insecurity: Not on file  ?Transportation Needs: Not on file  ?Physical Activity: Not on file  ?Stress: Not on file  ?Social Connections: Not on file  ?Intimate Partner Violence: Not on file  ? ?Family History  ?Problem  Relation Age of Onset  ? Multiple myeloma Sister   ? Brain cancer Sister   ? Dementia Mother   ?     died at 35  ? Stroke Mother   ? Heart failure Mother   ? Diabetes Mother   ? Heart disease Father   ? Prostate cancer Brother   ? Colon cancer Neg Hx   ? ? ? ? ?VITAL SIGNS ?BP 136/78   Pulse 73   Temp 98.4 ?F (36.9 ?C)   Resp (!) 23   Ht '5\' 3"'  (1.6 m)   Wt 135 lb 6.4 oz (61.4 kg)   SpO2 98%   BMI 23.99 kg/m?  ? ?Facility-Administered Encounter Medications as of 06/04/2021  ?Medication  ? octreotide (SANDOSTATIN LAR) 30 MG IM injection  ? octreotide (SANDOSTATIN LAR) 30 MG IM injection  ? ?Outpatient Encounter Medications as of 06/04/2021  ?Medication Sig  ? acetaminophen (TYLENOL) 325 MG tablet Take 650 mg by mouth every 8 (eight) hours.  ? acyclovir (ZOVIRAX) 200 MG capsule Take 200 mg by mouth in the morning. (0800)  ? Amino Acids-Protein Hydrolys (FEEDING SUPPLEMENT, PRO-STAT SUGAR FREE 64,) LIQD Take 30 mLs by mouth 3 (three) times daily  with meals. (0800, 1200 & 1800)  ? anastrozole (ARIMIDEX) 1 MG tablet TAKE 1 TABLET BY MOUTH DAILY  ? atenolol (TENORMIN) 25 MG tablet Take 1 tablet (25 mg total) by mouth in the morning. (0800)  ? atorvastatin (LIP

## 2021-06-06 DIAGNOSIS — Z992 Dependence on renal dialysis: Secondary | ICD-10-CM | POA: Diagnosis not present

## 2021-06-06 DIAGNOSIS — N2581 Secondary hyperparathyroidism of renal origin: Secondary | ICD-10-CM | POA: Diagnosis not present

## 2021-06-06 DIAGNOSIS — N186 End stage renal disease: Secondary | ICD-10-CM | POA: Diagnosis not present

## 2021-06-09 DIAGNOSIS — E559 Vitamin D deficiency, unspecified: Secondary | ICD-10-CM | POA: Diagnosis not present

## 2021-06-09 DIAGNOSIS — N186 End stage renal disease: Secondary | ICD-10-CM | POA: Diagnosis not present

## 2021-06-09 DIAGNOSIS — N2581 Secondary hyperparathyroidism of renal origin: Secondary | ICD-10-CM | POA: Diagnosis not present

## 2021-06-09 DIAGNOSIS — N250 Renal osteodystrophy: Secondary | ICD-10-CM | POA: Diagnosis not present

## 2021-06-09 DIAGNOSIS — Z992 Dependence on renal dialysis: Secondary | ICD-10-CM | POA: Diagnosis not present

## 2021-06-10 ENCOUNTER — Ambulatory Visit (HOSPITAL_COMMUNITY): Payer: Medicare PPO

## 2021-06-10 ENCOUNTER — Inpatient Hospital Stay (HOSPITAL_COMMUNITY): Payer: Medicare PPO

## 2021-06-10 ENCOUNTER — Ambulatory Visit (HOSPITAL_COMMUNITY): Payer: Medicare PPO | Admitting: Hematology

## 2021-06-11 DIAGNOSIS — Z992 Dependence on renal dialysis: Secondary | ICD-10-CM | POA: Diagnosis not present

## 2021-06-11 DIAGNOSIS — N2581 Secondary hyperparathyroidism of renal origin: Secondary | ICD-10-CM | POA: Diagnosis not present

## 2021-06-11 DIAGNOSIS — E559 Vitamin D deficiency, unspecified: Secondary | ICD-10-CM | POA: Diagnosis not present

## 2021-06-11 DIAGNOSIS — N250 Renal osteodystrophy: Secondary | ICD-10-CM | POA: Diagnosis not present

## 2021-06-11 DIAGNOSIS — N186 End stage renal disease: Secondary | ICD-10-CM | POA: Diagnosis not present

## 2021-06-12 ENCOUNTER — Inpatient Hospital Stay (HOSPITAL_COMMUNITY): Payer: Medicare PPO | Attending: Hematology

## 2021-06-12 DIAGNOSIS — I120 Hypertensive chronic kidney disease with stage 5 chronic kidney disease or end stage renal disease: Secondary | ICD-10-CM | POA: Diagnosis not present

## 2021-06-12 DIAGNOSIS — Z5112 Encounter for antineoplastic immunotherapy: Secondary | ICD-10-CM | POA: Diagnosis not present

## 2021-06-12 DIAGNOSIS — Z79811 Long term (current) use of aromatase inhibitors: Secondary | ICD-10-CM | POA: Diagnosis not present

## 2021-06-12 DIAGNOSIS — Z9011 Acquired absence of right breast and nipple: Secondary | ICD-10-CM | POA: Diagnosis not present

## 2021-06-12 DIAGNOSIS — Z170 Estrogen receptor positive status [ER+]: Secondary | ICD-10-CM | POA: Insufficient documentation

## 2021-06-12 DIAGNOSIS — C9000 Multiple myeloma not having achieved remission: Secondary | ICD-10-CM | POA: Insufficient documentation

## 2021-06-12 DIAGNOSIS — N186 End stage renal disease: Secondary | ICD-10-CM | POA: Insufficient documentation

## 2021-06-12 DIAGNOSIS — Z79899 Other long term (current) drug therapy: Secondary | ICD-10-CM | POA: Insufficient documentation

## 2021-06-12 DIAGNOSIS — E1122 Type 2 diabetes mellitus with diabetic chronic kidney disease: Secondary | ICD-10-CM | POA: Insufficient documentation

## 2021-06-12 DIAGNOSIS — M8580 Other specified disorders of bone density and structure, unspecified site: Secondary | ICD-10-CM | POA: Diagnosis not present

## 2021-06-12 DIAGNOSIS — D649 Anemia, unspecified: Secondary | ICD-10-CM | POA: Diagnosis not present

## 2021-06-12 DIAGNOSIS — C50411 Malignant neoplasm of upper-outer quadrant of right female breast: Secondary | ICD-10-CM | POA: Diagnosis not present

## 2021-06-12 LAB — COMPREHENSIVE METABOLIC PANEL
ALT: 14 U/L (ref 0–44)
AST: 18 U/L (ref 15–41)
Albumin: 3.4 g/dL — ABNORMAL LOW (ref 3.5–5.0)
Alkaline Phosphatase: 80 U/L (ref 38–126)
Anion gap: 10 (ref 5–15)
BUN: 40 mg/dL — ABNORMAL HIGH (ref 8–23)
CO2: 28 mmol/L (ref 22–32)
Calcium: 9.2 mg/dL (ref 8.9–10.3)
Chloride: 98 mmol/L (ref 98–111)
Creatinine, Ser: 6.53 mg/dL — ABNORMAL HIGH (ref 0.44–1.00)
GFR, Estimated: 6 mL/min — ABNORMAL LOW (ref >60)
Glucose, Bld: 153 mg/dL — ABNORMAL HIGH (ref 70–99)
Potassium: 4.3 mmol/L (ref 3.5–5.1)
Sodium: 136 mmol/L (ref 135–145)
Total Bilirubin: 0.4 mg/dL (ref 0.3–1.2)
Total Protein: 6.5 g/dL (ref 6.5–8.1)

## 2021-06-12 LAB — CBC WITH DIFFERENTIAL/PLATELET
Abs Immature Granulocytes: 0.01 10*3/uL (ref 0.00–0.07)
Basophils Absolute: 0 10*3/uL (ref 0.0–0.1)
Basophils Relative: 1 %
Eosinophils Absolute: 0.2 10*3/uL (ref 0.0–0.5)
Eosinophils Relative: 3 %
HCT: 27.7 % — ABNORMAL LOW (ref 36.0–46.0)
Hemoglobin: 8.8 g/dL — ABNORMAL LOW (ref 12.0–15.0)
Immature Granulocytes: 0 %
Lymphocytes Relative: 20 %
Lymphs Abs: 1.3 10*3/uL (ref 0.7–4.0)
MCH: 32.2 pg (ref 26.0–34.0)
MCHC: 31.8 g/dL (ref 30.0–36.0)
MCV: 101.5 fL — ABNORMAL HIGH (ref 80.0–100.0)
Monocytes Absolute: 0.7 10*3/uL (ref 0.1–1.0)
Monocytes Relative: 10 %
Neutro Abs: 4.3 10*3/uL (ref 1.7–7.7)
Neutrophils Relative %: 66 %
Platelets: 229 10*3/uL (ref 150–400)
RBC: 2.73 MIL/uL — ABNORMAL LOW (ref 3.87–5.11)
RDW: 17.9 % — ABNORMAL HIGH (ref 11.5–15.5)
WBC: 6.5 10*3/uL (ref 4.0–10.5)
nRBC: 0 % (ref 0.0–0.2)

## 2021-06-12 LAB — MAGNESIUM: Magnesium: 2.2 mg/dL (ref 1.7–2.4)

## 2021-06-13 DIAGNOSIS — E559 Vitamin D deficiency, unspecified: Secondary | ICD-10-CM | POA: Diagnosis not present

## 2021-06-13 DIAGNOSIS — Z992 Dependence on renal dialysis: Secondary | ICD-10-CM | POA: Diagnosis not present

## 2021-06-13 DIAGNOSIS — N250 Renal osteodystrophy: Secondary | ICD-10-CM | POA: Diagnosis not present

## 2021-06-13 DIAGNOSIS — N186 End stage renal disease: Secondary | ICD-10-CM | POA: Diagnosis not present

## 2021-06-13 DIAGNOSIS — N2581 Secondary hyperparathyroidism of renal origin: Secondary | ICD-10-CM | POA: Diagnosis not present

## 2021-06-13 LAB — KAPPA/LAMBDA LIGHT CHAINS
Kappa free light chain: 160.8 mg/L — ABNORMAL HIGH (ref 3.3–19.4)
Kappa, lambda light chain ratio: 0.56 (ref 0.26–1.65)
Lambda free light chains: 286.3 mg/L — ABNORMAL HIGH (ref 5.7–26.3)

## 2021-06-15 DIAGNOSIS — Z1159 Encounter for screening for other viral diseases: Secondary | ICD-10-CM | POA: Diagnosis not present

## 2021-06-15 DIAGNOSIS — C9000 Multiple myeloma not having achieved remission: Secondary | ICD-10-CM | POA: Diagnosis not present

## 2021-06-16 DIAGNOSIS — N186 End stage renal disease: Secondary | ICD-10-CM | POA: Diagnosis not present

## 2021-06-16 DIAGNOSIS — N2581 Secondary hyperparathyroidism of renal origin: Secondary | ICD-10-CM | POA: Diagnosis not present

## 2021-06-16 DIAGNOSIS — E559 Vitamin D deficiency, unspecified: Secondary | ICD-10-CM | POA: Diagnosis not present

## 2021-06-16 DIAGNOSIS — Z992 Dependence on renal dialysis: Secondary | ICD-10-CM | POA: Diagnosis not present

## 2021-06-16 DIAGNOSIS — N250 Renal osteodystrophy: Secondary | ICD-10-CM | POA: Diagnosis not present

## 2021-06-16 LAB — PROTEIN ELECTROPHORESIS, SERUM
A/G Ratio: 1.3 (ref 0.7–1.7)
Albumin ELP: 3.4 g/dL (ref 2.9–4.4)
Alpha-1-Globulin: 0.3 g/dL (ref 0.0–0.4)
Alpha-2-Globulin: 0.8 g/dL (ref 0.4–1.0)
Beta Globulin: 0.9 g/dL (ref 0.7–1.3)
Gamma Globulin: 0.6 g/dL (ref 0.4–1.8)
Globulin, Total: 2.6 g/dL (ref 2.2–3.9)
Total Protein ELP: 6 g/dL (ref 6.0–8.5)

## 2021-06-16 LAB — IMMUNOFIXATION ELECTROPHORESIS
IgA: 254 mg/dL (ref 64–422)
IgG (Immunoglobin G), Serum: 687 mg/dL (ref 586–1602)
IgM (Immunoglobulin M), Srm: 12 mg/dL — ABNORMAL LOW (ref 26–217)
Total Protein ELP: 5.8 g/dL — ABNORMAL LOW (ref 6.0–8.5)

## 2021-06-18 DIAGNOSIS — Z992 Dependence on renal dialysis: Secondary | ICD-10-CM | POA: Diagnosis not present

## 2021-06-18 DIAGNOSIS — M6281 Muscle weakness (generalized): Secondary | ICD-10-CM | POA: Diagnosis not present

## 2021-06-18 DIAGNOSIS — Z9181 History of falling: Secondary | ICD-10-CM | POA: Diagnosis not present

## 2021-06-18 DIAGNOSIS — E559 Vitamin D deficiency, unspecified: Secondary | ICD-10-CM | POA: Diagnosis not present

## 2021-06-18 DIAGNOSIS — N2581 Secondary hyperparathyroidism of renal origin: Secondary | ICD-10-CM | POA: Diagnosis not present

## 2021-06-18 DIAGNOSIS — N250 Renal osteodystrophy: Secondary | ICD-10-CM | POA: Diagnosis not present

## 2021-06-18 DIAGNOSIS — N186 End stage renal disease: Secondary | ICD-10-CM | POA: Diagnosis not present

## 2021-06-18 DIAGNOSIS — R279 Unspecified lack of coordination: Secondary | ICD-10-CM | POA: Diagnosis not present

## 2021-06-19 ENCOUNTER — Ambulatory Visit (HOSPITAL_COMMUNITY): Payer: Medicare PPO

## 2021-06-19 ENCOUNTER — Inpatient Hospital Stay (HOSPITAL_COMMUNITY): Payer: Medicare PPO

## 2021-06-19 ENCOUNTER — Inpatient Hospital Stay (HOSPITAL_COMMUNITY): Payer: Medicare PPO | Admitting: Hematology

## 2021-06-19 ENCOUNTER — Inpatient Hospital Stay (HOSPITAL_BASED_OUTPATIENT_CLINIC_OR_DEPARTMENT_OTHER): Payer: Medicare PPO | Admitting: Hematology

## 2021-06-19 VITALS — BP 112/63 | HR 69 | Temp 97.9°F | Resp 18

## 2021-06-19 DIAGNOSIS — Z170 Estrogen receptor positive status [ER+]: Secondary | ICD-10-CM | POA: Diagnosis not present

## 2021-06-19 DIAGNOSIS — C9000 Multiple myeloma not having achieved remission: Secondary | ICD-10-CM

## 2021-06-19 DIAGNOSIS — Z5112 Encounter for antineoplastic immunotherapy: Secondary | ICD-10-CM | POA: Diagnosis not present

## 2021-06-19 DIAGNOSIS — Z9181 History of falling: Secondary | ICD-10-CM | POA: Diagnosis not present

## 2021-06-19 DIAGNOSIS — R279 Unspecified lack of coordination: Secondary | ICD-10-CM | POA: Diagnosis not present

## 2021-06-19 DIAGNOSIS — D649 Anemia, unspecified: Secondary | ICD-10-CM | POA: Diagnosis not present

## 2021-06-19 DIAGNOSIS — E1122 Type 2 diabetes mellitus with diabetic chronic kidney disease: Secondary | ICD-10-CM | POA: Diagnosis not present

## 2021-06-19 DIAGNOSIS — N186 End stage renal disease: Secondary | ICD-10-CM | POA: Diagnosis not present

## 2021-06-19 DIAGNOSIS — Z79811 Long term (current) use of aromatase inhibitors: Secondary | ICD-10-CM | POA: Diagnosis not present

## 2021-06-19 DIAGNOSIS — M6281 Muscle weakness (generalized): Secondary | ICD-10-CM | POA: Diagnosis not present

## 2021-06-19 DIAGNOSIS — C50411 Malignant neoplasm of upper-outer quadrant of right female breast: Secondary | ICD-10-CM | POA: Diagnosis not present

## 2021-06-19 DIAGNOSIS — I120 Hypertensive chronic kidney disease with stage 5 chronic kidney disease or end stage renal disease: Secondary | ICD-10-CM | POA: Diagnosis not present

## 2021-06-19 DIAGNOSIS — N184 Chronic kidney disease, stage 4 (severe): Secondary | ICD-10-CM | POA: Diagnosis not present

## 2021-06-19 MED ORDER — DEXAMETHASONE 4 MG PO TABS
10.0000 mg | ORAL_TABLET | Freq: Once | ORAL | Status: AC
  Administered 2021-06-19: 10 mg via ORAL
  Filled 2021-06-19: qty 3

## 2021-06-19 MED ORDER — PROCHLORPERAZINE MALEATE 10 MG PO TABS
10.0000 mg | ORAL_TABLET | Freq: Once | ORAL | Status: AC
  Administered 2021-06-19: 10 mg via ORAL
  Filled 2021-06-19: qty 1

## 2021-06-19 MED ORDER — BORTEZOMIB CHEMO SQ INJECTION 3.5 MG (2.5MG/ML)
1.3000 mg/m2 | Freq: Once | INTRAMUSCULAR | Status: AC
  Administered 2021-06-19: 2 mg via SUBCUTANEOUS
  Filled 2021-06-19: qty 0.8

## 2021-06-19 NOTE — Progress Notes (Signed)
Carly Wood presents today for injection per the provider's orders. Labs and vital signs WNL for treatment today. Okay to proceed with treatment today per Dr.K. ? ?Velcade  administration without incident; injection site WNL; see MAR for injection details.  Patient tolerated procedure well and without incident.  No questions or complaints noted at this time.  ?Discharged from clinic ambulatory in stable condition. Alert and oriented x 3. F/U with [facility] as scheduled.   ?

## 2021-06-19 NOTE — Patient Instructions (Addendum)
[place] at [facility] ?Discharge Instructions ? ?You were seen and examined today by Dr. Delton Coombes. He reviewed your most recent labs and everything looks good from myeloma standpoint. Please keep follow up appointments as scheduled.  ? ? ?Thank you for choosing [place] at [facility] to provide your oncology and hematology care.  To afford each patient quality time with our provider, please arrive at least 15 minutes before your scheduled appointment time.  ? ?If you have a lab appointment with the [facility] please come in thru the Main Entrance and check in at the main information desk. ? ?You need to re-schedule your appointment should you arrive 10 or more minutes late.  We strive to give you quality time with our providers, and arriving late affects you and other patients whose appointments are after yours.  Also, if you no show three or more times for appointments you may be dismissed from the clinic at the providers discretion.     ?Again, thank you for choosing [facility].  Our hope is that these requests will decrease the amount of time that you wait before being seen by our physicians.       ?_____________________________________________________________ ? ?Should you have questions after your visit to [facility], please contact our office at [phone] and follow the prompts.  Our office hours are 8:00 a.m. and 4:30 p.m. Monday - Friday.  Please note that voicemails left after 4:00 p.m. may not be returned until the following business day.  We are closed weekends and major holidays.  You do have access to a nurse 24-7, just call the main number to the clinic [phone] and do not press any options, hold on the line and a nurse will answer the phone.   ? ?For prescription refill requests, have your pharmacy contact our office and allow 72 hours.   ? ?Due to Covid, you will need to wear a mask upon entering the  hospital. If you do not have a mask, a mask will be given to you at the Main Entrance upon arrival. For doctor visits, patients may have 1 support person age 45 or older with them. For treatment visits, patients can not have anyone with them due to social distancing guidelines and our immunocompromised population.  ? ?  ?

## 2021-06-19 NOTE — Patient Instructions (Signed)
[facility]  Discharge Instructions: ?Thank you for choosing [facility] to provide your oncology and hematology care.  ?If you have a lab appointment with the [facility], please come in thru the Main Entrance and check in at the main information desk. ? ?Wear comfortable clothing and clothing appropriate for easy access to any Portacath or PICC line.  ? ?We strive to give you quality time with your provider. You may need to reschedule your appointment if you arrive late (15 or more minutes).  Arriving late affects you and other patients whose appointments are after yours.  Also, if you miss three or more appointments without notifying the office, you may be dismissed from the clinic at the provider?s discretion.    ?  ?For prescription refill requests, have your pharmacy contact our office and allow 72 hours for refills to be completed.   ? ?Today you received the following chemotherapy and/or immunotherapy agents Velcade injection. ?  ?To help prevent nausea and vomiting after your treatment, we encourage you to take your nausea medication as directed. ? ?BELOW ARE SYMPTOMS THAT SHOULD BE REPORTED IMMEDIATELY: ?*FEVER GREATER THAN 100.4 F (38 ?C) OR HIGHER ?*CHILLS OR SWEATING ?*NAUSEA AND VOMITING THAT IS NOT CONTROLLED WITH YOUR NAUSEA MEDICATION ?*UNUSUAL SHORTNESS OF BREATH ?*UNUSUAL BRUISING OR BLEEDING ?*URINARY PROBLEMS (pain or burning when urinating, or frequent urination) ?*BOWEL PROBLEMS (unusual diarrhea, constipation, pain near the anus) ?TENDERNESS IN MOUTH AND THROAT WITH OR WITHOUT PRESENCE OF ULCERS (sore throat, sores in mouth, or a toothache) ?UNUSUAL RASH, SWELLING OR PAIN  ?UNUSUAL VAGINAL DISCHARGE OR ITCHING  ? ?Items with * indicate a potential emergency and should be followed up as soon as possible or go to the Emergency Department if any problems should occur. ? ?Please show the CHEMOTHERAPY ALERT CARD or IMMUNOTHERAPY ALERT CARD at check-in to the Emergency  Department and triage nurse. ? ?Should you have questions after your visit or need to cancel or reschedule your appointment, please contact [facility] [phone]  and follow the prompts.  Office hours are 8:00 a.m. to 4:30 p.m. Monday - Friday. Please note that voicemails left after 4:00 p.m. may not be returned until the following business day.  We are closed weekends and major holidays. You have access to a nurse at all times for urgent questions. Please call the main number to the clinic [phone] and follow the prompts. ? ?For any non-urgent questions, you may also contact your provider using MyChart. We now offer e-Visits for anyone 32 and older to request care online for non-urgent symptoms. For details visit [email or web address]. ?  ?Also download the MyChart app! Go to the app store, search "MyChart", open the app, select [facility], and log in with your MyChart username and password. ? ?Due to Covid, a mask is required upon entering the hospital/clinic. If you do not have a mask, one will be given to you upon arrival. For doctor visits, patients may have 1 support person aged 77 or older with them. For treatment visits, patients cannot have anyone with them due to current Covid guidelines and our immunocompromised population.  ?

## 2021-06-19 NOTE — Progress Notes (Signed)
? ?[facility] ?[address] ?[place], [place] [address] ? ? ?CLINIC:  ?Medical Oncology/Hematology ? ?PCP:  ?Gerlene Fee, NP ?[address] [place] [address] ?[phone] ? ? ?REASON FOR VISIT:  ?Follow-up for plasma cell myeloma and anemia ? ?PRIOR THERAPY: none ? ?NGS Results: not done ? ?CURRENT THERAPY: Velcade & Decadron 3/4 weeks ? ?BRIEF ONCOLOGIC HISTORY:  ?Oncology History  ?Stage 1 infiltrating ductal carcinoma of right female breast ([place])  ?09/12/2014 Mammogram  ? Mass in upper outer R breast, middle third depth appears slightly larger than it was on prior exam with more irreg spiculated margins ?  ?10/02/2014 Pathology Results  ? biopsy with invasive ductal carcinoma high grade 1.1 cm, dcis solid type. additional R breast tissue, excision, invasive ductal high grade 0.9 cm  ?  ?10/24/2014 Pathology Results  ? no residual invasive carcinoma, DCIS, focal, atypical ductal hyperplasia, 0/7 LN positive for metastatic carcinoma ER > 90%, PR 30%, HER 2 2+ ?  ?10/24/2014 Cancer Staging  ? T1cN0M0 ?  ?10/24/2014 Surgery  ? R mastectomy, T1c, N0 ?  ?12/28/2014 - 11/22/2015 Chemotherapy  ? Taxol/herceptin weekly X 12, Herceptin every 21 days, last due in September 2017 ?  ?03/11/2015 -  Anti-estrogen oral therapy  ? Arimidex 1 mg daily ?  ?09/12/2015 Imaging  ? MUGA- The left ventricular ejection fraction equals 68%. ?  ?06/08/2016 Treatment Plan Change  ? Started Nerlynx ?  ?Multiple myeloma ([place] [place])  ?10/04/2018 Initial Diagnosis  ? Multiple myeloma ([place]) ?  ?10/11/2018 -  Chemotherapy  ? Patient is on Treatment Plan : MYELOMA NON-TRANSPLANT CANDIDATES VRd weekly d21d   ?   ? ? ?CANCER STAGING: ? Cancer Staging  ?Stage 1 infiltrating ductal carcinoma of right female breast ([place]) ?Staging form: Breast, AJCC 7th Edition ?- Clinical stage from 08/30/2015: Stage IA (T1c, N0, M0) - Signed by Baird Cancer, PA-C on 08/30/2015 ? ? ?INTERVAL HISTORY:  ?Ms. Carly Wood, a 86 y.o. female, returns for routine  follow-up and consideration for next cycle of chemotherapy. Carly Wood was last seen on 03/06/2021. ? ?Due for cycle #35 of VRd today.  ? ?Overall, she tells me she has been feeling pretty well. She reports dry cough. She reports occasional tingling and numbness in her fingers. Her appetite is good, and she denies diarrhea. She denies recent infections. She denies current bleeding and black stools.  ? ?Overall, she feels ready for next cycle of chemo today.  ? ? ?REVIEW OF SYSTEMS:  ?Review of Systems  ?Constitutional:  Negative for appetite change and fatigue.  ?HENT:   Negative for nosebleeds.   ?Respiratory:  Positive for cough (dry). Negative for hemoptysis.   ?Gastrointestinal:  Positive for vomiting. Negative for blood in stool and diarrhea.  ?Genitourinary:  Negative for hematuria.   ?Neurological:  Positive for numbness (fingers).  ?All other systems reviewed and are negative. ? ?PAST MEDICAL/SURGICAL HISTORY:  ?Past Medical History:  ?Diagnosis Date  ? Anemia   ?  chronic macrocytic anemia  ? Anxiety   ? Chronic kidney disease   ? Chronic renal disease, stage 4, severely decreased glomerular filtration rate (GFR) between 15-29 mL/min/1.73 square meter (HCC) 08/22/2015  ? Complication of anesthesia   ? delirious after Breast Surgery  ? Dementia ([place])   ? mild  ? Depression   ? Diabetes mellitus with ESRD (end-stage renal disease) ([place])   ? type II  ? Dysphagia   ? Dyspnea   ? with activity  ?  GERD (gastroesophageal reflux disease)   ? Glaucoma   ? Hyperlipidemia   ? Hypertension   ? Pneumonia   ? Stage 1 infiltrating ductal carcinoma of right female breast ([place]) 08/21/2015  ? ER+ PR+ HER 2 neu + (3+) T1cN0   ? ?Past Surgical History:  ?Procedure Laterality Date  ? A/V FISTULAGRAM Right 07/05/2020  ? Procedure: A/V Fistulagram;  Surgeon: Elam Dutch, MD;  Location: [facility];  Service: Cardiovascular;  Laterality: Right;  ? AV FISTULA PLACEMENT Left 11/22/2017  ? Procedure: ARTERIOVENOUS (AV) FISTULA  CREATION LEFT ARM;  Surgeon: Elam Dutch, MD;  Location: [facility];  Service: Vascular;  Laterality: Left;  ? AV FISTULA PLACEMENT Right 04/04/2020  ? Procedure: RIGHT ARM ARTERIOVENOUS FISTULA CREATION;  Surgeon: Rosetta Posner, MD;  [facility];  Service: Vascular;  Laterality: Right;  ? AV FISTULA PLACEMENT Right 08/20/2020  ? Procedure: ARTERIOVENOUS (AV) FISTULA LIGATION RIGHT ARM;  Surgeon: Elam Dutch, MD;  Location: [facility];  Service: Vascular;  Laterality: Right;  ? BIOPSY  08/07/2016  ? Procedure: BIOPSY;  Surgeon: Daneil Dolin, MD;  [facility];  Service: Endoscopy;;  gastric ulcer biopsy  ? COLONOSCOPY    ? ESOPHAGOGASTRODUODENOSCOPY N/A 08/07/2016  ? LA Grade A esophagitis s/p dilation, small hiatal hernia, multiple gastric ulcers and erosions, duodenal erosions s/p biopsy. Negative H.pylori   ? ESOPHAGOGASTRODUODENOSCOPY N/A 11/27/2016  ? normal esophagus, previously noted gastric ulcers completely healed, normal duodenum.   ? ESOPHAGOGASTRODUODENOSCOPY (EGD) WITH PROPOFOL N/A 07/23/2020  ? Procedure: ESOPHAGOGASTRODUODENOSCOPY (EGD) WITH PROPOFOL;  Surgeon: Rogene Houston, MD;  [facility];  Service: Endoscopy;  Laterality: N/A;  ? FISTULA SUPERFICIALIZATION Left 02/14/2018  ? Procedure: FISTULA SUPERFICIALIZATION LEFT ARM;  Surgeon: Angelia Mould, MD;  Location: [facility];  Service: Vascular;  Laterality: Left;  ? FRACTURE SURGERY Right   ? ankle  ? HOT HEMOSTASIS  07/23/2020  ? Procedure: HOT HEMOSTASIS (ARGON PLASMA COAGULATION/BICAP);  Surgeon: Rogene Houston, MD;  [facility];  Service: Endoscopy;;  ? INTRAMEDULLARY (IM) NAIL INTERTROCHANTERIC Right 07/12/2020  ? Procedure: INTRAMEDULLARY (IM) NAIL INTERTROCHANTRIC;  Surgeon: Mordecai Rasmussen, MD;  [facility];  Service: Orthopedics;  Laterality: Right;  ? MALONEY DILATION N/A 08/07/2016  ? Procedure: MALONEY DILATION;  Surgeon: Daneil Dolin, MD;  [facility];  Service: Endoscopy;   Laterality: N/A;  ? MASTECTOMY, PARTIAL Right   ? multiple myeloma    ? PERIPHERAL VASCULAR BALLOON ANGIOPLASTY Left 07/13/2019  ? Procedure: PERIPHERAL VASCULAR BALLOON ANGIOPLASTY;  Surgeon: Marty Heck, MD;  Location: [facility];  Service: Cardiovascular;  Laterality: Left;  arm fistulogram  ? PERIPHERAL VASCULAR BALLOON ANGIOPLASTY Right 05/22/2020  ? Procedure: PERIPHERAL VASCULAR BALLOON ANGIOPLASTY;  Surgeon: Cherre Robins, MD;  Location: [facility];  Service: Cardiovascular;  Laterality: Right;  arm fistula  ? PERIPHERAL VASCULAR BALLOON ANGIOPLASTY Right 07/05/2020  ? Procedure: PERIPHERAL VASCULAR BALLOON ANGIOPLASTY;  Surgeon: Elam Dutch, MD;  Location: [facility];  Service: Cardiovascular;  Laterality: Right;  arm fistula  ? PORT-A-CATH REMOVAL Left 11/22/2017  ? Procedure: REMOVAL PORT-A-CATH LEFT CHEST;  Surgeon: Elam Dutch, MD;  Location: [facility] OR;  Service: Vascular;  Laterality: Left;  ? RETINAL DETACHMENT SURGERY Right   ? SCLEROTHERAPY  07/23/2020  ? Procedure: SCLEROTHERAPY;  Surgeon: Rogene Houston, MD;  [facility];  Service: Endoscopy;;  ? UPPER EXTREMITY VENOGRAPHY N/A  08/22/2020  ? Procedure: LEFT UPPER & CENTRAL VENOGRAPHY;  Surgeon: Marty Heck, MD;  Location: [facility];  Service: Cardiovascular;  Laterality: N/A;  ? ? ?SOCIAL HISTORY:  ?Social History  ? ?Socioeconomic History  ? Marital status: Single  ?  Spouse name: Not on file  ? Number of children: Not on file  ? Years of education: Not on file  ? Highest education level: Not on file  ?Occupational History  ? Occupation: retired   ?Tobacco Use  ? Smoking status: Never  ? Smokeless tobacco: Never  ?Vaping Use  ? Vaping Use: Never used  ?Substance and Sexual Activity  ? Alcohol use: No  ?  Alcohol/week: 0.0 standard drinks  ? Drug use: No  ? Sexual activity: Never  ?Other Topics Concern  ? Not on file  ?Social History Narrative  ? Long term resident of SNF    ? ?Social Determinants of Health  ? ?Financial Resource Strain: Not on file  ?Food Insecurity: Not on file  ?Transportation Needs: Not on file  ?Physical Activity: Not on file  ?Stress: Not on file  ?Social Con

## 2021-06-19 NOTE — Progress Notes (Signed)
Patient has been assessed, vital signs and labs have been reviewed by Dr. Katragadda. ANC, Creatinine, LFTs, and Platelets are within treatment parameters per Dr. Katragadda. The patient is good to proceed with treatment at this time. Primary RN and pharmacy aware.  

## 2021-06-20 ENCOUNTER — Encounter (HOSPITAL_COMMUNITY): Payer: Self-pay | Admitting: Hematology

## 2021-06-20 DIAGNOSIS — M6281 Muscle weakness (generalized): Secondary | ICD-10-CM | POA: Diagnosis not present

## 2021-06-20 DIAGNOSIS — N2581 Secondary hyperparathyroidism of renal origin: Secondary | ICD-10-CM | POA: Diagnosis not present

## 2021-06-20 DIAGNOSIS — Z9181 History of falling: Secondary | ICD-10-CM | POA: Diagnosis not present

## 2021-06-20 DIAGNOSIS — N186 End stage renal disease: Secondary | ICD-10-CM | POA: Diagnosis not present

## 2021-06-20 DIAGNOSIS — N250 Renal osteodystrophy: Secondary | ICD-10-CM | POA: Diagnosis not present

## 2021-06-20 DIAGNOSIS — Z992 Dependence on renal dialysis: Secondary | ICD-10-CM | POA: Diagnosis not present

## 2021-06-20 DIAGNOSIS — R279 Unspecified lack of coordination: Secondary | ICD-10-CM | POA: Diagnosis not present

## 2021-06-20 DIAGNOSIS — E559 Vitamin D deficiency, unspecified: Secondary | ICD-10-CM | POA: Diagnosis not present

## 2021-06-23 ENCOUNTER — Ambulatory Visit (HOSPITAL_COMMUNITY): Payer: Medicare PPO | Admitting: Hematology

## 2021-06-23 ENCOUNTER — Other Ambulatory Visit (HOSPITAL_COMMUNITY): Payer: Medicare PPO

## 2021-06-23 ENCOUNTER — Ambulatory Visit (HOSPITAL_COMMUNITY): Payer: Medicare PPO

## 2021-06-23 DIAGNOSIS — R279 Unspecified lack of coordination: Secondary | ICD-10-CM | POA: Diagnosis not present

## 2021-06-23 DIAGNOSIS — Z9181 History of falling: Secondary | ICD-10-CM | POA: Diagnosis not present

## 2021-06-23 DIAGNOSIS — M6281 Muscle weakness (generalized): Secondary | ICD-10-CM | POA: Diagnosis not present

## 2021-06-23 DIAGNOSIS — N2581 Secondary hyperparathyroidism of renal origin: Secondary | ICD-10-CM | POA: Diagnosis not present

## 2021-06-23 DIAGNOSIS — E559 Vitamin D deficiency, unspecified: Secondary | ICD-10-CM | POA: Diagnosis not present

## 2021-06-23 DIAGNOSIS — N186 End stage renal disease: Secondary | ICD-10-CM | POA: Diagnosis not present

## 2021-06-23 DIAGNOSIS — N250 Renal osteodystrophy: Secondary | ICD-10-CM | POA: Diagnosis not present

## 2021-06-23 DIAGNOSIS — Z992 Dependence on renal dialysis: Secondary | ICD-10-CM | POA: Diagnosis not present

## 2021-06-24 DIAGNOSIS — R279 Unspecified lack of coordination: Secondary | ICD-10-CM | POA: Diagnosis not present

## 2021-06-24 DIAGNOSIS — Z9181 History of falling: Secondary | ICD-10-CM | POA: Diagnosis not present

## 2021-06-24 DIAGNOSIS — N186 End stage renal disease: Secondary | ICD-10-CM | POA: Diagnosis not present

## 2021-06-24 DIAGNOSIS — M6281 Muscle weakness (generalized): Secondary | ICD-10-CM | POA: Diagnosis not present

## 2021-06-25 DIAGNOSIS — Z992 Dependence on renal dialysis: Secondary | ICD-10-CM | POA: Diagnosis not present

## 2021-06-25 DIAGNOSIS — E559 Vitamin D deficiency, unspecified: Secondary | ICD-10-CM | POA: Diagnosis not present

## 2021-06-25 DIAGNOSIS — R279 Unspecified lack of coordination: Secondary | ICD-10-CM | POA: Diagnosis not present

## 2021-06-25 DIAGNOSIS — N2581 Secondary hyperparathyroidism of renal origin: Secondary | ICD-10-CM | POA: Diagnosis not present

## 2021-06-25 DIAGNOSIS — N186 End stage renal disease: Secondary | ICD-10-CM | POA: Diagnosis not present

## 2021-06-25 DIAGNOSIS — M6281 Muscle weakness (generalized): Secondary | ICD-10-CM | POA: Diagnosis not present

## 2021-06-25 DIAGNOSIS — N250 Renal osteodystrophy: Secondary | ICD-10-CM | POA: Diagnosis not present

## 2021-06-25 DIAGNOSIS — Z9181 History of falling: Secondary | ICD-10-CM | POA: Diagnosis not present

## 2021-06-26 DIAGNOSIS — N186 End stage renal disease: Secondary | ICD-10-CM | POA: Diagnosis not present

## 2021-06-26 DIAGNOSIS — Z9181 History of falling: Secondary | ICD-10-CM | POA: Diagnosis not present

## 2021-06-26 DIAGNOSIS — M6281 Muscle weakness (generalized): Secondary | ICD-10-CM | POA: Diagnosis not present

## 2021-06-26 DIAGNOSIS — R279 Unspecified lack of coordination: Secondary | ICD-10-CM | POA: Diagnosis not present

## 2021-06-27 DIAGNOSIS — Z992 Dependence on renal dialysis: Secondary | ICD-10-CM | POA: Diagnosis not present

## 2021-06-27 DIAGNOSIS — E559 Vitamin D deficiency, unspecified: Secondary | ICD-10-CM | POA: Diagnosis not present

## 2021-06-27 DIAGNOSIS — N186 End stage renal disease: Secondary | ICD-10-CM | POA: Diagnosis not present

## 2021-06-27 DIAGNOSIS — R279 Unspecified lack of coordination: Secondary | ICD-10-CM | POA: Diagnosis not present

## 2021-06-27 DIAGNOSIS — Z9181 History of falling: Secondary | ICD-10-CM | POA: Diagnosis not present

## 2021-06-27 DIAGNOSIS — N250 Renal osteodystrophy: Secondary | ICD-10-CM | POA: Diagnosis not present

## 2021-06-27 DIAGNOSIS — N2581 Secondary hyperparathyroidism of renal origin: Secondary | ICD-10-CM | POA: Diagnosis not present

## 2021-06-27 DIAGNOSIS — M6281 Muscle weakness (generalized): Secondary | ICD-10-CM | POA: Diagnosis not present

## 2021-06-29 DIAGNOSIS — Z9181 History of falling: Secondary | ICD-10-CM | POA: Diagnosis not present

## 2021-06-29 DIAGNOSIS — R279 Unspecified lack of coordination: Secondary | ICD-10-CM | POA: Diagnosis not present

## 2021-06-29 DIAGNOSIS — N186 End stage renal disease: Secondary | ICD-10-CM | POA: Diagnosis not present

## 2021-06-29 DIAGNOSIS — M6281 Muscle weakness (generalized): Secondary | ICD-10-CM | POA: Diagnosis not present

## 2021-06-30 DIAGNOSIS — Z992 Dependence on renal dialysis: Secondary | ICD-10-CM | POA: Diagnosis not present

## 2021-06-30 DIAGNOSIS — M6281 Muscle weakness (generalized): Secondary | ICD-10-CM | POA: Diagnosis not present

## 2021-06-30 DIAGNOSIS — R279 Unspecified lack of coordination: Secondary | ICD-10-CM | POA: Diagnosis not present

## 2021-06-30 DIAGNOSIS — N186 End stage renal disease: Secondary | ICD-10-CM | POA: Diagnosis not present

## 2021-06-30 DIAGNOSIS — E559 Vitamin D deficiency, unspecified: Secondary | ICD-10-CM | POA: Diagnosis not present

## 2021-06-30 DIAGNOSIS — Z9181 History of falling: Secondary | ICD-10-CM | POA: Diagnosis not present

## 2021-06-30 DIAGNOSIS — N250 Renal osteodystrophy: Secondary | ICD-10-CM | POA: Diagnosis not present

## 2021-06-30 DIAGNOSIS — N2581 Secondary hyperparathyroidism of renal origin: Secondary | ICD-10-CM | POA: Diagnosis not present

## 2021-07-01 DIAGNOSIS — Z9181 History of falling: Secondary | ICD-10-CM | POA: Diagnosis not present

## 2021-07-01 DIAGNOSIS — M6281 Muscle weakness (generalized): Secondary | ICD-10-CM | POA: Diagnosis not present

## 2021-07-01 DIAGNOSIS — R279 Unspecified lack of coordination: Secondary | ICD-10-CM | POA: Diagnosis not present

## 2021-07-01 DIAGNOSIS — N186 End stage renal disease: Secondary | ICD-10-CM | POA: Diagnosis not present

## 2021-07-02 ENCOUNTER — Non-Acute Institutional Stay (SKILLED_NURSING_FACILITY): Payer: Medicare PPO | Admitting: Adult Health

## 2021-07-02 ENCOUNTER — Encounter: Payer: Self-pay | Admitting: Adult Health

## 2021-07-02 DIAGNOSIS — E1122 Type 2 diabetes mellitus with diabetic chronic kidney disease: Secondary | ICD-10-CM

## 2021-07-02 DIAGNOSIS — N186 End stage renal disease: Secondary | ICD-10-CM

## 2021-07-02 DIAGNOSIS — M6281 Muscle weakness (generalized): Secondary | ICD-10-CM | POA: Diagnosis not present

## 2021-07-02 DIAGNOSIS — Z992 Dependence on renal dialysis: Secondary | ICD-10-CM

## 2021-07-02 DIAGNOSIS — E785 Hyperlipidemia, unspecified: Secondary | ICD-10-CM | POA: Diagnosis not present

## 2021-07-02 DIAGNOSIS — N2581 Secondary hyperparathyroidism of renal origin: Secondary | ICD-10-CM | POA: Diagnosis not present

## 2021-07-02 DIAGNOSIS — I120 Hypertensive chronic kidney disease with stage 5 chronic kidney disease or end stage renal disease: Secondary | ICD-10-CM | POA: Diagnosis not present

## 2021-07-02 DIAGNOSIS — E1169 Type 2 diabetes mellitus with other specified complication: Secondary | ICD-10-CM | POA: Diagnosis not present

## 2021-07-02 DIAGNOSIS — N250 Renal osteodystrophy: Secondary | ICD-10-CM | POA: Diagnosis not present

## 2021-07-02 DIAGNOSIS — Z8781 Personal history of (healed) traumatic fracture: Secondary | ICD-10-CM

## 2021-07-02 DIAGNOSIS — E559 Vitamin D deficiency, unspecified: Secondary | ICD-10-CM | POA: Diagnosis not present

## 2021-07-02 DIAGNOSIS — R279 Unspecified lack of coordination: Secondary | ICD-10-CM | POA: Diagnosis not present

## 2021-07-02 DIAGNOSIS — Z9181 History of falling: Secondary | ICD-10-CM | POA: Diagnosis not present

## 2021-07-02 DIAGNOSIS — Z794 Long term (current) use of insulin: Secondary | ICD-10-CM | POA: Diagnosis not present

## 2021-07-02 NOTE — Progress Notes (Signed)
Carly Wood ?Location:  [facility] ?Nursing Home Room Number: S/148/D ?Place of Service:  SNF (31) ? ? ?CODE STATUS: DNR ? ?Allergies  ?Allergen Reactions  ? Ace Inhibitors Other (See Comments) and Cough  ?  Tongue swell , ie angioedema  ? Angiotensin Receptor Blockers   ?  Angioedema with ACE-I  ? Penicillins Other (See Comments)  ?  Unsure of reaction ?Has patient had a PCN reaction causing immediate rash, facial/tongue/throat swelling, SOB or lightheadedness with hypotension: Unknown ?Has patient had a PCN reaction causing severe rash involving mucus membranes or skin necrosis: Unknown ?Has patient had a PCN reaction that required hospitalization: No ?Has patient had a PCN reaction occurring within the last 10 years: Unknown ?If all of the above answers are "NO", then may proceed with Cephalosporin use. ? ?  ? Penicillin G   ? ? ?Chief Complaint  ?Patient presents with  ? Medical Management of Chronic Issues  ?                 History right hip fracture:   Chronic kidney disease with end stage renal disease on dialysis due to type 2 diabetes mellitus/dependence on dialysis:   Hypertension due to end stage renal disease (ESRD) due to type 2 diabetes mellitus   Hyperlipidemia associated with type 2 diabetes mellitus:  ? ? ?HPI: ? ? ? ?Past Medical History:  ?Diagnosis Date  ? Anemia   ?  chronic macrocytic anemia  ? Anxiety   ? Chronic kidney disease   ? Chronic renal disease, stage 4, severely decreased glomerular filtration rate (GFR) between 15-29 mL/min/1.73 square meter (HCC) 08/22/2015  ? Complication of anesthesia   ? delirious after Breast Surgery  ? Dementia ([place])   ? mild  ? Depression   ? Diabetes mellitus with ESRD (end-stage renal disease) ([place])   ? type II  ? Dysphagia   ? Dyspnea   ? with activity  ? GERD (gastroesophageal reflux disease)   ? Glaucoma   ? Hyperlipidemia   ? Hypertension   ? Pneumonia   ? Stage 1 infiltrating ductal carcinoma of right female breast ([place]) 08/21/2015  ? ER+ PR+ HER 2  neu + (3+) T1cN0   ? ? ?Past Surgical History:  ?Procedure Laterality Date  ? A/V FISTULAGRAM Right 07/05/2020  ? Procedure: A/V Fistulagram;  Surgeon: Elam Dutch, MD;  Location: [facility];  Service: Cardiovascular;  Laterality: Right;  ? AV FISTULA PLACEMENT Left 11/22/2017  ? Procedure: ARTERIOVENOUS (AV) FISTULA CREATION LEFT ARM;  Surgeon: Elam Dutch, MD;  Location: [facility] OR;  Service: Vascular;  Laterality: Left;  ? AV FISTULA PLACEMENT Right 04/04/2020  ? Procedure: RIGHT ARM ARTERIOVENOUS FISTULA CREATION;  Surgeon: Rosetta Posner, MD;  [facility];  Service: Vascular;  Laterality: Right;  ? AV FISTULA PLACEMENT Right 08/20/2020  ? Procedure: ARTERIOVENOUS (AV) FISTULA LIGATION RIGHT ARM;  Surgeon: Elam Dutch, MD;  Location: [facility];  Service: Vascular;  Laterality: Right;  ? BIOPSY  08/07/2016  ? Procedure: BIOPSY;  Surgeon: Daneil Dolin, MD;  [facility];  Service: Endoscopy;;  gastric ulcer biopsy  ? COLONOSCOPY    ? ESOPHAGOGASTRODUODENOSCOPY N/A 08/07/2016  ? LA Grade A esophagitis s/p dilation, small hiatal hernia, multiple gastric ulcers and erosions, duodenal erosions s/p biopsy. Negative H.pylori   ? ESOPHAGOGASTRODUODENOSCOPY N/A 11/27/2016  ? normal esophagus, previously noted gastric ulcers completely healed, normal duodenum.   ? ESOPHAGOGASTRODUODENOSCOPY (EGD) WITH PROPOFOL N/A 07/23/2020  ?  Procedure: ESOPHAGOGASTRODUODENOSCOPY (EGD) WITH PROPOFOL;  Surgeon: Rogene Houston, MD;  [facility];  Service: Endoscopy;  Laterality: N/A;  ? FISTULA SUPERFICIALIZATION Left 02/14/2018  ? Procedure: FISTULA SUPERFICIALIZATION LEFT ARM;  Surgeon: Angelia Mould, MD;  Location: [facility];  Service: Vascular;  Laterality: Left;  ? FRACTURE SURGERY Right   ? ankle  ? HOT HEMOSTASIS  07/23/2020  ? Procedure: HOT HEMOSTASIS (ARGON PLASMA COAGULATION/BICAP);  Surgeon: Rogene Houston, MD;  [facility];  Service: Endoscopy;;  ? INTRAMEDULLARY (IM)  NAIL INTERTROCHANTERIC Right 07/12/2020  ? Procedure: INTRAMEDULLARY (IM) NAIL INTERTROCHANTRIC;  Surgeon: Mordecai Rasmussen, MD;  [facility];  Service: Orthopedics;  Laterality: Right;  ? MALONEY DILATION N/A 08/07/2016  ? Procedure: MALONEY DILATION;  Surgeon: Daneil Dolin, MD;  [facility];  Service: Endoscopy;  Laterality: N/A;  ? MASTECTOMY, PARTIAL Right   ? multiple myeloma    ? PERIPHERAL VASCULAR BALLOON ANGIOPLASTY Left 07/13/2019  ? Procedure: PERIPHERAL VASCULAR BALLOON ANGIOPLASTY;  Surgeon: Marty Heck, MD;  Location: [facility];  Service: Cardiovascular;  Laterality: Left;  arm fistulogram  ? PERIPHERAL VASCULAR BALLOON ANGIOPLASTY Right 05/22/2020  ? Procedure: PERIPHERAL VASCULAR BALLOON ANGIOPLASTY;  Surgeon: Cherre Robins, MD;  Location: [facility];  Service: Cardiovascular;  Laterality: Right;  arm fistula  ? PERIPHERAL VASCULAR BALLOON ANGIOPLASTY Right 07/05/2020  ? Procedure: PERIPHERAL VASCULAR BALLOON ANGIOPLASTY;  Surgeon: Elam Dutch, MD;  Location: [facility];  Service: Cardiovascular;  Laterality: Right;  arm fistula  ? PORT-A-CATH REMOVAL Left 11/22/2017  ? Procedure: REMOVAL PORT-A-CATH LEFT CHEST;  Surgeon: Elam Dutch, MD;  Location: [facility] OR;  Service: Vascular;  Laterality: Left;  ? RETINAL DETACHMENT SURGERY Right   ? SCLEROTHERAPY  07/23/2020  ? Procedure: SCLEROTHERAPY;  Surgeon: Rogene Houston, MD;  [facility];  Service: Endoscopy;;  ? UPPER EXTREMITY VENOGRAPHY N/A 08/22/2020  ? Procedure: LEFT UPPER & CENTRAL VENOGRAPHY;  Surgeon: Marty Heck, MD;  Location: [facility];  Service: Cardiovascular;  Laterality: N/A;  ? ? ?Social History  ? ?Socioeconomic History  ? Marital status: Single  ?  Spouse name: Not on file  ? Number of children: Not on file  ? Years of education: Not on file  ? Highest education level: Not on file  ?Occupational History  ? Occupation: retired   ?Tobacco Use  ? Smoking  status: Never  ? Smokeless tobacco: Never  ?Vaping Use  ? Vaping Use: Never used  ?Substance and Sexual Activity  ? Alcohol use: No  ?  Alcohol/week: 0.0 standard drinks  ? Drug use: No  ? Sexual activity: Never  ?Other Topics Concern  ? Not on file  ?Social History Narrative  ? Long term resident of SNF   ? ?Social Determinants of Health  ? ?Financial Resource Strain: Not on file  ?Food Insecurity: Not on file  ?Transportation Needs: Not on file  ?Physical Activity: Not on file  ?Stress: Not on file  ?Social Connections: Not on file  ?Intimate Partner Violence: Not on file  ? ?Family History  ?Problem Relation Age of Onset  ? Multiple myeloma Sister   ? Brain cancer Sister   ? Dementia Mother   ?     died at 24  ? Stroke Mother   ? Heart failure Mother   ? Diabetes Mother   ? Heart disease Father   ? Prostate cancer Brother   ?  Colon cancer Neg Hx   ? ? ? ? ?VITAL SIGNS ?BP 134/62   Pulse 76   Temp (!) 97 ?F (36.1 ?C)   Resp (!) 23   Ht '5\' 3"'  (1.6 m)   Wt 136 lb 12.8 oz (62.1 kg)   BMI 24.23 kg/m?  ? ?Facility-Administered Encounter Medications as of 07/02/2021  ?Medication  ? octreotide (SANDOSTATIN LAR) 30 MG IM injection  ? octreotide (SANDOSTATIN LAR) 30 MG IM injection  ? ?Outpatient Encounter Medications as of 07/02/2021  ?Medication Sig  ? acetaminophen (TYLENOL) 325 MG tablet Take 650 mg by mouth every 8 (eight) hours.  ? acyclovir (ZOVIRAX) 200 MG capsule Take 200 mg by mouth in the morning. (0800)  ? Amino Acids-Protein Hydrolys (FEEDING SUPPLEMENT, PRO-STAT SUGAR FREE 64,) LIQD Take 30 mLs by mouth 3 (three) times daily with meals. (0800, 1200 & 1800)  ? anastrozole (ARIMIDEX) 1 MG tablet TAKE 1 TABLET BY MOUTH DAILY  ? atenolol (TENORMIN) 25 MG tablet Take 1 tablet (25 mg total) by mouth in the morning. (0800)  ? atorvastatin (LIPITOR) 10 MG tablet Take 10 mg by mouth at bedtime. (2000)  ? Balsam [place]-Castor Oil (VENELEX) OINT Apply topically. Apply to bilateral buttocks and sacrum every shift.  ?  Calcium Acetate 667 MG TABS Take 2 tablets by mouth 2 (two) times daily.  ? Calcium Acetate 667 MG TABS Take by mouth daily.  ? calcium-vitamin D (OSCAL WITH D) 500-200 MG-UNIT tablet Take 1 tablet by mouth in th

## 2021-07-03 DIAGNOSIS — Z794 Long term (current) use of insulin: Secondary | ICD-10-CM | POA: Insufficient documentation

## 2021-07-03 DIAGNOSIS — Z8781 Personal history of (healed) traumatic fracture: Secondary | ICD-10-CM | POA: Insufficient documentation

## 2021-07-04 ENCOUNTER — Encounter: Payer: Self-pay | Admitting: Adult Health

## 2021-07-04 ENCOUNTER — Non-Acute Institutional Stay (SKILLED_NURSING_FACILITY): Payer: Medicare PPO | Admitting: Adult Health

## 2021-07-04 DIAGNOSIS — Z992 Dependence on renal dialysis: Secondary | ICD-10-CM

## 2021-07-04 DIAGNOSIS — I70209 Unspecified atherosclerosis of native arteries of extremities, unspecified extremity: Secondary | ICD-10-CM

## 2021-07-04 DIAGNOSIS — E559 Vitamin D deficiency, unspecified: Secondary | ICD-10-CM | POA: Diagnosis not present

## 2021-07-04 DIAGNOSIS — N2581 Secondary hyperparathyroidism of renal origin: Secondary | ICD-10-CM | POA: Diagnosis not present

## 2021-07-04 DIAGNOSIS — F339 Major depressive disorder, recurrent, unspecified: Secondary | ICD-10-CM | POA: Diagnosis not present

## 2021-07-04 DIAGNOSIS — N186 End stage renal disease: Secondary | ICD-10-CM | POA: Diagnosis not present

## 2021-07-04 DIAGNOSIS — N250 Renal osteodystrophy: Secondary | ICD-10-CM | POA: Diagnosis not present

## 2021-07-04 DIAGNOSIS — I700 Atherosclerosis of aorta: Secondary | ICD-10-CM

## 2021-07-04 NOTE — Progress Notes (Signed)
?Location:  [place] ?Nursing Home Room Number: 148-D ?Place of Service:  SNF (31) ? ? ?CODE STATUS: DNR ? ?Allergies  ?Allergen Reactions  ? Ace Inhibitors Other (See Comments) and Cough  ?  Tongue swell , ie angioedema  ? Angiotensin Receptor Blockers   ?  Angioedema with ACE-I  ? Penicillins Other (See Comments)  ?  Unsure of reaction ?Has patient had a PCN reaction causing immediate rash, facial/tongue/throat swelling, SOB or lightheadedness with hypotension: Unknown ?Has patient had a PCN reaction causing severe rash involving mucus membranes or skin necrosis: Unknown ?Has patient had a PCN reaction that required hospitalization: No ?Has patient had a PCN reaction occurring within the last 10 years: Unknown ?If all of the above answers are "NO", then may proceed with Cephalosporin use. ? ?  ? Penicillin G   ? ? ?Chief Complaint  ?Patient presents with  ? Acute Visit  ?  Care plan meeting  ? ? ?HPI: ? ?We have come together for her care plan meeting. BIMS 13/15 mood 3/30: decreased energy worse after dialysis. She is nonambulatory there have been no falls. She requires limited to extensive assist with her adl care. She is frequently incontinent of bladder and bowel. Dietary: weight is 137.2 pounds; is stable; regular diet has a good appetite; therapy: transfers; stretching; standing 3.5 minutes with contact guard. She continues to be followed for her chronic illnesses including: Atherosclerotic peripheral vascular disease Aortic atherosclerosis Dependence on renal dialysis Major depression recurrent chronic ? ?Past Medical History:  ?Diagnosis Date  ? Anemia   ?  chronic macrocytic anemia  ? Anxiety   ? Chronic kidney disease   ? Chronic renal disease, stage 4, severely decreased glomerular filtration rate (GFR) between 15-29 mL/min/1.73 square meter (HCC) 08/22/2015  ? Complication of anesthesia   ? delirious after Breast Surgery  ? Dementia ([place])   ? mild  ? Depression   ? Diabetes mellitus with  ESRD (end-stage renal disease) ([place])   ? type II  ? Dysphagia   ? Dyspnea   ? with activity  ? GERD (gastroesophageal reflux disease)   ? Glaucoma   ? Hyperlipidemia   ? Hypertension   ? Pneumonia   ? Stage 1 infiltrating ductal carcinoma of right female breast ([place]) 08/21/2015  ? ER+ PR+ HER 2 neu + (3+) T1cN0   ? ? ?Past Surgical History:  ?Procedure Laterality Date  ? A/V FISTULAGRAM Right 07/05/2020  ? Procedure: A/V Fistulagram;  Surgeon: Elam Dutch, MD;  Location: [facility];  Service: Cardiovascular;  Laterality: Right;  ? AV FISTULA PLACEMENT Left 11/22/2017  ? Procedure: ARTERIOVENOUS (AV) FISTULA CREATION LEFT ARM;  Surgeon: Elam Dutch, MD;  Location[facility] OR;  Service: Vascular;  Laterality: Left;  ? AV FISTULA PLACEMENT Right 04/04/2020  ? Procedure: RIGHT ARM ARTERIOVENOUS FISTULA CREATION;  Surgeon: Rosetta Posner, MD;  Location: [facility];  Service: Vascular;  Laterality: Right;  ? AV FISTULA PLACEMENT Right 08/20/2020  ? Procedure: ARTERIOVENOUS (AV) FISTULA LIGATION RIGHT ARM;  Surgeon: Elam Dutch, MD;  Location: [place];  Service: Vascular;  Laterality: Right;  ? BIOPSY  08/07/2016  ? Procedure: BIOPSY;  Surgeon: Daneil Dolin, MD;  [facility];  Service: Endoscopy;;  gastric ulcer biopsy  ? COLONOSCOPY    ? ESOPHAGOGASTRODUODENOSCOPY N/A 08/07/2016  ? LA Grade A esophagitis s/p dilation, small hiatal hernia, multiple gastric ulcers and erosions, duodenal erosions s/p biopsy. Negative H.pylori   ?  ESOPHAGOGASTRODUODENOSCOPY N/A 11/27/2016  ? normal esophagus, previously noted gastric ulcers completely healed, normal duodenum.   ? ESOPHAGOGASTRODUODENOSCOPY (EGD) WITH PROPOFOL N/A 07/23/2020  ? Procedure: ESOPHAGOGASTRODUODENOSCOPY (EGD) WITH PROPOFOL;  Surgeon: Rogene Houston, MD;  [facility];  Service: Endoscopy;  Laterality: N/A;  ? FISTULA SUPERFICIALIZATION Left 02/14/2018  ? Procedure: FISTULA SUPERFICIALIZATION LEFT ARM;  Surgeon: Angelia Mould,  MD;  Location: [facility];  Service: Vascular;  Laterality: Left;  ? FRACTURE SURGERY Right   ? ankle  ? HOT HEMOSTASIS  07/23/2020  ? Procedure: HOT HEMOSTASIS (ARGON PLASMA COAGULATION/BICAP);  Surgeon: Rogene Houston, MD;  [facility];  Service: Endoscopy;;  ? INTRAMEDULLARY (IM) NAIL INTERTROCHANTERIC Right 07/12/2020  ? Procedure: INTRAMEDULLARY (IM) NAIL INTERTROCHANTRIC;  Surgeon: Mordecai Rasmussen, MD;  Location: [facility];  Service: Orthopedics;  Laterality: Right;  ? MALONEY DILATION N/A 08/07/2016  ? Procedure: MALONEY DILATION;  Surgeon: Daneil Dolin, MD;  [facility];  Service: Endoscopy;  Laterality: N/A;  ? MASTECTOMY, PARTIAL Right   ? multiple myeloma    ? PERIPHERAL VASCULAR BALLOON ANGIOPLASTY Left 07/13/2019  ? Procedure: PERIPHERAL VASCULAR BALLOON ANGIOPLASTY;  Surgeon: Marty Heck, MD;  Location: [place] [facility];  Service: Cardiovascular;  Laterality: Left;  arm fistulogram  ? PERIPHERAL VASCULAR BALLOON ANGIOPLASTY Right 05/22/2020  ? Procedure: PERIPHERAL VASCULAR BALLOON ANGIOPLASTY;  Surgeon: Cherre Robins, MD;  Location: [place] [facility];  Service: Cardiovascular;  Laterality: Right;  arm fistula  ? PERIPHERAL VASCULAR BALLOON ANGIOPLASTY Right 07/05/2020  ? Procedure: PERIPHERAL VASCULAR BALLOON ANGIOPLASTY;  Surgeon: Elam Dutch, MD;  Location: [place] [facility];  Service: Cardiovascular;  Laterality: Right;  arm fistula  ? PORT-A-CATH REMOVAL Left 11/22/2017  ? Procedure: REMOVAL PORT-A-CATH LEFT CHEST;  Surgeon: Elam Dutch, MD;  Location: [facility] OR;  Service: Vascular;  Laterality: Left;  ? RETINAL DETACHMENT SURGERY Right   ? SCLEROTHERAPY  07/23/2020  ? Procedure: SCLEROTHERAPY;  Surgeon: Rogene Houston, MD;  [facility];  Service: Endoscopy;;  ? UPPER EXTREMITY VENOGRAPHY N/A 08/22/2020  ? Procedure: LEFT UPPER & CENTRAL VENOGRAPHY;  Surgeon: Marty Heck, MD;  Location: [place] [facility];  Service: Cardiovascular;   Laterality: N/A;  ? ? ?Social History  ? ?Socioeconomic History  ? Marital status: Single  ?  Spouse name: Not on file  ? Number of children: Not on file  ? Years of education: Not on file  ? Highest education level: Not on file  ?Occupational History  ? Occupation: retired   ?Tobacco Use  ? Smoking status: Never  ? Smokeless tobacco: Never  ?Vaping Use  ? Vaping Use: Never used  ?Substance and Sexual Activity  ? Alcohol use: No  ?  Alcohol/week: 0.0 standard drinks  ? Drug use: No  ? Sexual activity: Never  ?Other Topics Concern  ? Not on file  ?Social History Narrative  ? Long term resident of SNF   ? ?Social Determinants of Health  ? ?Financial Resource Strain: Not on file  ?Food Insecurity: Not on file  ?Transportation Needs: Not on file  ?Physical Activity: Not on file  ?Stress: Not on file  ?Social Connections: Not on file  ?Intimate Partner Violence: Not on file  ? ?Family History  ?Problem Relation Age of Onset  ? Multiple myeloma Sister   ? Brain cancer Sister   ? Dementia Mother   ?     died at 73  ? Stroke  Mother   ? Heart failure Mother   ? Diabetes Mother   ? Heart disease Father   ? Prostate cancer Brother   ? Colon cancer Neg Hx   ? ? ? ? ?VITAL SIGNS ?BP (!) 145/68   Pulse 76   Temp (!) 97.3 ?F (36.3 ?C)   Resp (!) 23   Ht 5' 3" (1.6 m)   Wt 136 lb 12.8 oz (62.1 kg)   SpO2 98%   BMI 24.23 kg/m?  ? ?Facility-Administered Encounter Medications as of 07/04/2021  ?Medication  ? octreotide (SANDOSTATIN LAR) 30 MG IM injection  ? octreotide (SANDOSTATIN LAR) 30 MG IM injection  ? ?Outpatient Encounter Medications as of 07/04/2021  ?Medication Sig  ? acetaminophen (TYLENOL) 325 MG tablet Take 650 mg by mouth every 8 (eight) hours.  ? acyclovir (ZOVIRAX) 200 MG capsule Take 200 mg by mouth in the morning. (0800)  ? Amino Acids-Protein Hydrolys (FEEDING SUPPLEMENT, PRO-STAT SUGAR FREE 64,) LIQD Take 30 mLs by mouth 3 (three) times daily with meals. (0800, 1200 & 1800)  ? anastrozole (ARIMIDEX) 1 MG  tablet TAKE 1 TABLET BY MOUTH DAILY  ? atenolol (TENORMIN) 25 MG tablet Take 1 tablet (25 mg total) by mouth in the morning. (0800)  ? atorvastatin (LIPITOR) 10 MG tablet Take 10 mg by mouth at bedtime. (2000)  ? Balsam Per

## 2021-07-06 DIAGNOSIS — Z992 Dependence on renal dialysis: Secondary | ICD-10-CM | POA: Diagnosis not present

## 2021-07-06 DIAGNOSIS — N186 End stage renal disease: Secondary | ICD-10-CM | POA: Diagnosis not present

## 2021-07-07 ENCOUNTER — Other Ambulatory Visit (HOSPITAL_COMMUNITY)
Admission: RE | Admit: 2021-07-07 | Discharge: 2021-07-07 | Disposition: A | Discharge status: Home / Self Care | Payer: Medicare PPO | Source: Skilled Nursing Facility | Attending: Adult Health | Admitting: Adult Health

## 2021-07-07 DIAGNOSIS — N2581 Secondary hyperparathyroidism of renal origin: Secondary | ICD-10-CM | POA: Diagnosis not present

## 2021-07-07 DIAGNOSIS — N186 End stage renal disease: Secondary | ICD-10-CM | POA: Diagnosis not present

## 2021-07-07 DIAGNOSIS — E559 Vitamin D deficiency, unspecified: Secondary | ICD-10-CM | POA: Diagnosis not present

## 2021-07-07 DIAGNOSIS — E1122 Type 2 diabetes mellitus with diabetic chronic kidney disease: Secondary | ICD-10-CM | POA: Diagnosis not present

## 2021-07-07 DIAGNOSIS — Z992 Dependence on renal dialysis: Secondary | ICD-10-CM | POA: Diagnosis not present

## 2021-07-07 DIAGNOSIS — N250 Renal osteodystrophy: Secondary | ICD-10-CM | POA: Diagnosis not present

## 2021-07-07 LAB — LIPID PANEL
Cholesterol: 171 mg/dL (ref 0–200)
HDL: 29 mg/dL — ABNORMAL LOW (ref >40)
LDL Cholesterol: 89 mg/dL (ref 0–99)
Total CHOL/HDL Ratio: 5.9 RATIO
Triglycerides: 265 mg/dL — ABNORMAL HIGH (ref <150)
VLDL: 53 mg/dL — ABNORMAL HIGH (ref 0–40)

## 2021-07-08 ENCOUNTER — Inpatient Hospital Stay (HOSPITAL_COMMUNITY): Payer: Medicare PPO

## 2021-07-08 ENCOUNTER — Inpatient Hospital Stay (HOSPITAL_COMMUNITY): Payer: Medicare PPO | Attending: Hematology

## 2021-07-08 VITALS — BP 92/44 | HR 73 | Temp 98.0°F | Resp 17

## 2021-07-08 DIAGNOSIS — C9000 Multiple myeloma not having achieved remission: Secondary | ICD-10-CM

## 2021-07-08 DIAGNOSIS — Z5111 Encounter for antineoplastic chemotherapy: Secondary | ICD-10-CM | POA: Insufficient documentation

## 2021-07-08 DIAGNOSIS — M6281 Muscle weakness (generalized): Secondary | ICD-10-CM | POA: Diagnosis not present

## 2021-07-08 DIAGNOSIS — R279 Unspecified lack of coordination: Secondary | ICD-10-CM | POA: Diagnosis not present

## 2021-07-08 DIAGNOSIS — N186 End stage renal disease: Secondary | ICD-10-CM | POA: Diagnosis not present

## 2021-07-08 DIAGNOSIS — Z9181 History of falling: Secondary | ICD-10-CM | POA: Diagnosis not present

## 2021-07-08 LAB — COMPREHENSIVE METABOLIC PANEL
ALT: 12 U/L (ref 0–44)
AST: 19 U/L (ref 15–41)
Albumin: 3.9 g/dL (ref 3.5–5.0)
Alkaline Phosphatase: 84 U/L (ref 38–126)
Anion gap: 13 (ref 5–15)
BUN: 51 mg/dL — ABNORMAL HIGH (ref 8–23)
CO2: 25 mmol/L (ref 22–32)
Calcium: 9.7 mg/dL (ref 8.9–10.3)
Chloride: 100 mmol/L (ref 98–111)
Creatinine, Ser: 7.74 mg/dL — ABNORMAL HIGH (ref 0.44–1.00)
GFR, Estimated: 5 mL/min — ABNORMAL LOW (ref >60)
Glucose, Bld: 164 mg/dL — ABNORMAL HIGH (ref 70–99)
Potassium: 4.4 mmol/L (ref 3.5–5.1)
Sodium: 138 mmol/L (ref 135–145)
Total Bilirubin: 0.5 mg/dL (ref 0.3–1.2)
Total Protein: 7.5 g/dL (ref 6.5–8.1)

## 2021-07-08 LAB — CBC WITH DIFFERENTIAL/PLATELET
Abs Immature Granulocytes: 0.07 10*3/uL (ref 0.00–0.07)
Basophils Absolute: 0.1 10*3/uL (ref 0.0–0.1)
Basophils Relative: 0 %
Eosinophils Absolute: 0.2 10*3/uL (ref 0.0–0.5)
Eosinophils Relative: 1 %
HCT: 32.8 % — ABNORMAL LOW (ref 36.0–46.0)
Hemoglobin: 9.8 g/dL — ABNORMAL LOW (ref 12.0–15.0)
Immature Granulocytes: 0 %
Lymphocytes Relative: 7 %
Lymphs Abs: 1.1 10*3/uL (ref 0.7–4.0)
MCH: 31.3 pg (ref 26.0–34.0)
MCHC: 29.9 g/dL — ABNORMAL LOW (ref 30.0–36.0)
MCV: 104.8 fL — ABNORMAL HIGH (ref 80.0–100.0)
Monocytes Absolute: 0.8 10*3/uL (ref 0.1–1.0)
Monocytes Relative: 5 %
Neutro Abs: 13.5 10*3/uL — ABNORMAL HIGH (ref 1.7–7.7)
Neutrophils Relative %: 87 %
Platelets: 226 10*3/uL (ref 150–400)
RBC: 3.13 MIL/uL — ABNORMAL LOW (ref 3.87–5.11)
RDW: 17.2 % — ABNORMAL HIGH (ref 11.5–15.5)
WBC: 15.7 10*3/uL — ABNORMAL HIGH (ref 4.0–10.5)
nRBC: 0 % (ref 0.0–0.2)

## 2021-07-08 LAB — MAGNESIUM: Magnesium: 2.1 mg/dL (ref 1.7–2.4)

## 2021-07-08 MED ORDER — BORTEZOMIB CHEMO SQ INJECTION 3.5 MG (2.5MG/ML)
1.3000 mg/m2 | Freq: Once | INTRAMUSCULAR | Status: AC
  Administered 2021-07-08: 2 mg via SUBCUTANEOUS
  Filled 2021-07-08: qty 0.8

## 2021-07-08 MED ORDER — DEXAMETHASONE 4 MG PO TABS
10.0000 mg | ORAL_TABLET | Freq: Once | ORAL | Status: AC
  Administered 2021-07-08: 10 mg via ORAL
  Filled 2021-07-08: qty 3

## 2021-07-08 MED ORDER — PROCHLORPERAZINE MALEATE 10 MG PO TABS
10.0000 mg | ORAL_TABLET | Freq: Once | ORAL | Status: AC
  Administered 2021-07-08: 10 mg via ORAL
  Filled 2021-07-08: qty 1

## 2021-07-08 NOTE — Patient Instructions (Signed)
[place]  Discharge Instructions: ?Thank you for choosing [facility] to provide your oncology and hematology care.  ?If you have a lab appointment with the [facility], please come in thru the Main Entrance and check in at the main information desk. ? ?Wear comfortable clothing and clothing appropriate for easy access to any Portacath or PICC line.  ? ?We strive to give you quality time with your provider. You may need to reschedule your appointment if you arrive late (15 or more minutes).  Arriving late affects you and other patients whose appointments are after yours.  Also, if you miss three or more appointments without notifying the office, you may be dismissed from the clinic at the provider?s discretion.    ?  ?For prescription refill requests, have your pharmacy contact our office and allow 72 hours for refills to be completed.   ? ?Today you received the following chemotherapy and/or immunotherapy agents Velcade ?  ?To help prevent nausea and vomiting after your treatment, we encourage you to take your nausea medication as directed. ? ?BELOW ARE SYMPTOMS THAT SHOULD BE REPORTED IMMEDIATELY: ?*FEVER GREATER THAN 100.4 F (38 ?C) OR HIGHER ?*CHILLS OR SWEATING ?*NAUSEA AND VOMITING THAT IS NOT CONTROLLED WITH YOUR NAUSEA MEDICATION ?*UNUSUAL SHORTNESS OF BREATH ?*UNUSUAL BRUISING OR BLEEDING ?*URINARY PROBLEMS (pain or burning when urinating, or frequent urination) ?*BOWEL PROBLEMS (unusual diarrhea, constipation, pain near the anus) ?TENDERNESS IN MOUTH AND THROAT WITH OR WITHOUT PRESENCE OF ULCERS (sore throat, sores in mouth, or a toothache) ?UNUSUAL RASH, SWELLING OR PAIN  ?UNUSUAL VAGINAL DISCHARGE OR ITCHING  ? ?Items with * indicate a potential emergency and should be followed up as soon as possible or go to the Emergency Department if any problems should occur. ? ?Please show the CHEMOTHERAPY ALERT CARD or IMMUNOTHERAPY ALERT CARD at check-in to the Emergency Department  and triage nurse. ? ?Should you have questions after your visit or need to cancel or reschedule your appointment, please contact [facility] [phone]  and follow the prompts.  Office hours are 8:00 a.m. to 4:30 p.m. Monday - Friday. Please note that voicemails left after 4:00 p.m. may not be returned until the following business day.  We are closed weekends and major holidays. You have access to a nurse at all times for urgent questions. Please call the main number to the clinic [phone] and follow the prompts. ? ?For any non-urgent questions, you may also contact your provider using MyChart. We now offer e-Visits for anyone 21 and older to request care online for non-urgent symptoms. For details visit [email or web address]. ?  ?Also download the MyChart app! Go to the app store, search "MyChart", open the app, select [facility], and log in with your MyChart username and password. ? ?Due to Covid, a mask is required upon entering the hospital/clinic. If you do not have a mask, one will be given to you upon arrival. For doctor visits, patients may have 1 support person aged 85 or older with them. For treatment visits, patients cannot have anyone with them due to current Covid guidelines and our immunocompromised population.  ?

## 2021-07-08 NOTE — Progress Notes (Signed)
Pt presents today for Velcade injection per provider's order. Labs and vital signs WNL for treatment today. ? ?Velcade injection given today per MD orders. Tolerated infusion without adverse affects. Vital signs stable. No complaints at this time. Discharged from clinic via wheelchair in stable condition. Alert and oriented x 3. F/U with [facility] as scheduled.   ?

## 2021-07-09 DIAGNOSIS — N250 Renal osteodystrophy: Secondary | ICD-10-CM | POA: Diagnosis not present

## 2021-07-09 DIAGNOSIS — Z992 Dependence on renal dialysis: Secondary | ICD-10-CM | POA: Diagnosis not present

## 2021-07-09 DIAGNOSIS — E559 Vitamin D deficiency, unspecified: Secondary | ICD-10-CM | POA: Diagnosis not present

## 2021-07-09 DIAGNOSIS — N186 End stage renal disease: Secondary | ICD-10-CM | POA: Diagnosis not present

## 2021-07-09 DIAGNOSIS — N2581 Secondary hyperparathyroidism of renal origin: Secondary | ICD-10-CM | POA: Diagnosis not present

## 2021-07-09 LAB — PROTEIN ELECTROPHORESIS, SERUM
A/G Ratio: 1.3 (ref 0.7–1.7)
Albumin ELP: 3.9 g/dL (ref 2.9–4.4)
Alpha-1-Globulin: 0.3 g/dL (ref 0.0–0.4)
Alpha-2-Globulin: 0.9 g/dL (ref 0.4–1.0)
Beta Globulin: 1.1 g/dL (ref 0.7–1.3)
Gamma Globulin: 0.6 g/dL (ref 0.4–1.8)
Globulin, Total: 2.9 g/dL (ref 2.2–3.9)
Total Protein ELP: 6.8 g/dL (ref 6.0–8.5)

## 2021-07-09 LAB — KAPPA/LAMBDA LIGHT CHAINS
Kappa free light chain: 153.7 mg/L — ABNORMAL HIGH (ref 3.3–19.4)
Kappa, lambda light chain ratio: 0.42 (ref 0.26–1.65)
Lambda free light chains: 363 mg/L — ABNORMAL HIGH (ref 5.7–26.3)

## 2021-07-10 DIAGNOSIS — M6281 Muscle weakness (generalized): Secondary | ICD-10-CM | POA: Diagnosis not present

## 2021-07-10 DIAGNOSIS — N186 End stage renal disease: Secondary | ICD-10-CM | POA: Diagnosis not present

## 2021-07-10 DIAGNOSIS — R279 Unspecified lack of coordination: Secondary | ICD-10-CM | POA: Diagnosis not present

## 2021-07-10 DIAGNOSIS — Z9181 History of falling: Secondary | ICD-10-CM | POA: Diagnosis not present

## 2021-07-10 LAB — IMMUNOFIXATION ELECTROPHORESIS
IgA: 320 mg/dL (ref 64–422)
IgG (Immunoglobin G), Serum: 782 mg/dL (ref 586–1602)
IgM (Immunoglobulin M), Srm: 17 mg/dL — ABNORMAL LOW (ref 26–217)
Total Protein ELP: 6.6 g/dL (ref 6.0–8.5)

## 2021-07-11 DIAGNOSIS — Z992 Dependence on renal dialysis: Secondary | ICD-10-CM | POA: Diagnosis not present

## 2021-07-11 DIAGNOSIS — N2581 Secondary hyperparathyroidism of renal origin: Secondary | ICD-10-CM | POA: Diagnosis not present

## 2021-07-11 DIAGNOSIS — E559 Vitamin D deficiency, unspecified: Secondary | ICD-10-CM | POA: Diagnosis not present

## 2021-07-11 DIAGNOSIS — N250 Renal osteodystrophy: Secondary | ICD-10-CM | POA: Diagnosis not present

## 2021-07-11 DIAGNOSIS — N186 End stage renal disease: Secondary | ICD-10-CM | POA: Diagnosis not present

## 2021-07-11 LAB — HEMOGLOBIN A1C

## 2021-07-14 DIAGNOSIS — Z992 Dependence on renal dialysis: Secondary | ICD-10-CM | POA: Diagnosis not present

## 2021-07-14 DIAGNOSIS — N186 End stage renal disease: Secondary | ICD-10-CM | POA: Diagnosis not present

## 2021-07-14 DIAGNOSIS — R279 Unspecified lack of coordination: Secondary | ICD-10-CM | POA: Diagnosis not present

## 2021-07-14 DIAGNOSIS — Z9181 History of falling: Secondary | ICD-10-CM | POA: Diagnosis not present

## 2021-07-14 DIAGNOSIS — N2581 Secondary hyperparathyroidism of renal origin: Secondary | ICD-10-CM | POA: Diagnosis not present

## 2021-07-14 DIAGNOSIS — N250 Renal osteodystrophy: Secondary | ICD-10-CM | POA: Diagnosis not present

## 2021-07-14 DIAGNOSIS — E559 Vitamin D deficiency, unspecified: Secondary | ICD-10-CM | POA: Diagnosis not present

## 2021-07-14 DIAGNOSIS — M6281 Muscle weakness (generalized): Secondary | ICD-10-CM | POA: Diagnosis not present

## 2021-07-15 ENCOUNTER — Other Ambulatory Visit (HOSPITAL_COMMUNITY): Payer: Medicare PPO

## 2021-07-15 ENCOUNTER — Ambulatory Visit (HOSPITAL_COMMUNITY): Payer: Medicare PPO

## 2021-07-16 DIAGNOSIS — N2581 Secondary hyperparathyroidism of renal origin: Secondary | ICD-10-CM | POA: Diagnosis not present

## 2021-07-16 DIAGNOSIS — E559 Vitamin D deficiency, unspecified: Secondary | ICD-10-CM | POA: Diagnosis not present

## 2021-07-16 DIAGNOSIS — Z992 Dependence on renal dialysis: Secondary | ICD-10-CM | POA: Diagnosis not present

## 2021-07-16 DIAGNOSIS — N186 End stage renal disease: Secondary | ICD-10-CM | POA: Diagnosis not present

## 2021-07-16 DIAGNOSIS — N250 Renal osteodystrophy: Secondary | ICD-10-CM | POA: Diagnosis not present

## 2021-07-18 DIAGNOSIS — N250 Renal osteodystrophy: Secondary | ICD-10-CM | POA: Diagnosis not present

## 2021-07-18 DIAGNOSIS — N186 End stage renal disease: Secondary | ICD-10-CM | POA: Diagnosis not present

## 2021-07-18 DIAGNOSIS — E559 Vitamin D deficiency, unspecified: Secondary | ICD-10-CM | POA: Diagnosis not present

## 2021-07-18 DIAGNOSIS — Z992 Dependence on renal dialysis: Secondary | ICD-10-CM | POA: Diagnosis not present

## 2021-07-18 DIAGNOSIS — N2581 Secondary hyperparathyroidism of renal origin: Secondary | ICD-10-CM | POA: Diagnosis not present

## 2021-07-21 DIAGNOSIS — N250 Renal osteodystrophy: Secondary | ICD-10-CM | POA: Diagnosis not present

## 2021-07-21 DIAGNOSIS — Z992 Dependence on renal dialysis: Secondary | ICD-10-CM | POA: Diagnosis not present

## 2021-07-21 DIAGNOSIS — N2581 Secondary hyperparathyroidism of renal origin: Secondary | ICD-10-CM | POA: Diagnosis not present

## 2021-07-21 DIAGNOSIS — E559 Vitamin D deficiency, unspecified: Secondary | ICD-10-CM | POA: Diagnosis not present

## 2021-07-21 DIAGNOSIS — N186 End stage renal disease: Secondary | ICD-10-CM | POA: Diagnosis not present

## 2021-07-22 ENCOUNTER — Inpatient Hospital Stay (HOSPITAL_COMMUNITY): Payer: Medicare PPO

## 2021-07-22 ENCOUNTER — Other Ambulatory Visit: Payer: Self-pay

## 2021-07-22 VITALS — BP 119/72 | HR 76 | Temp 98.0°F | Resp 20 | Wt 132.1 lb

## 2021-07-22 DIAGNOSIS — C9000 Multiple myeloma not having achieved remission: Secondary | ICD-10-CM

## 2021-07-22 DIAGNOSIS — Z5111 Encounter for antineoplastic chemotherapy: Secondary | ICD-10-CM | POA: Diagnosis not present

## 2021-07-22 LAB — COMPREHENSIVE METABOLIC PANEL
ALT: 12 U/L (ref 0–44)
AST: 19 U/L (ref 15–41)
Albumin: 3.6 g/dL (ref 3.5–5.0)
Alkaline Phosphatase: 79 U/L (ref 38–126)
Anion gap: 12 (ref 5–15)
BUN: 33 mg/dL — ABNORMAL HIGH (ref 8–23)
CO2: 28 mmol/L (ref 22–32)
Calcium: 8.9 mg/dL (ref 8.9–10.3)
Chloride: 99 mmol/L (ref 98–111)
Creatinine, Ser: 6.81 mg/dL — ABNORMAL HIGH (ref 0.44–1.00)
GFR, Estimated: 6 mL/min — ABNORMAL LOW (ref >60)
Glucose, Bld: 145 mg/dL — ABNORMAL HIGH (ref 70–99)
Potassium: 3.9 mmol/L (ref 3.5–5.1)
Sodium: 139 mmol/L (ref 135–145)
Total Bilirubin: 0.3 mg/dL (ref 0.3–1.2)
Total Protein: 6.7 g/dL (ref 6.5–8.1)

## 2021-07-22 LAB — CBC WITH DIFFERENTIAL/PLATELET
Abs Immature Granulocytes: 0.03 10*3/uL (ref 0.00–0.07)
Basophils Absolute: 0.1 10*3/uL (ref 0.0–0.1)
Basophils Relative: 1 %
Eosinophils Absolute: 0.3 10*3/uL (ref 0.0–0.5)
Eosinophils Relative: 5 %
HCT: 30.8 % — ABNORMAL LOW (ref 36.0–46.0)
Hemoglobin: 9.5 g/dL — ABNORMAL LOW (ref 12.0–15.0)
Immature Granulocytes: 0 %
Lymphocytes Relative: 14 %
Lymphs Abs: 1 10*3/uL (ref 0.7–4.0)
MCH: 31.9 pg (ref 26.0–34.0)
MCHC: 30.8 g/dL (ref 30.0–36.0)
MCV: 103.4 fL — ABNORMAL HIGH (ref 80.0–100.0)
Monocytes Absolute: 0.6 10*3/uL (ref 0.1–1.0)
Monocytes Relative: 8 %
Neutro Abs: 5.2 10*3/uL (ref 1.7–7.7)
Neutrophils Relative %: 72 %
Platelets: 226 10*3/uL (ref 150–400)
RBC: 2.98 MIL/uL — ABNORMAL LOW (ref 3.87–5.11)
RDW: 17.3 % — ABNORMAL HIGH (ref 11.5–15.5)
WBC: 7.2 10*3/uL (ref 4.0–10.5)
nRBC: 0.3 % — ABNORMAL HIGH (ref 0.0–0.2)

## 2021-07-22 LAB — MAGNESIUM: Magnesium: 2.1 mg/dL (ref 1.7–2.4)

## 2021-07-22 MED ORDER — BORTEZOMIB CHEMO SQ INJECTION 3.5 MG (2.5MG/ML)
1.3000 mg/m2 | Freq: Once | INTRAMUSCULAR | Status: AC
  Administered 2021-07-22: 2 mg via SUBCUTANEOUS
  Filled 2021-07-22: qty 0.8

## 2021-07-22 MED ORDER — PROCHLORPERAZINE MALEATE 10 MG PO TABS
10.0000 mg | ORAL_TABLET | Freq: Once | ORAL | Status: AC
  Administered 2021-07-22: 10 mg via ORAL
  Filled 2021-07-22: qty 1

## 2021-07-22 MED ORDER — DEXAMETHASONE 4 MG PO TABS
10.0000 mg | ORAL_TABLET | Freq: Once | ORAL | Status: AC
  Administered 2021-07-22: 10 mg via ORAL
  Filled 2021-07-22: qty 3

## 2021-07-22 NOTE — Patient Instructions (Signed)
[place]  Discharge Instructions: ?Thank you for choosing [facility] to provide your oncology and hematology care.  ?If you have a lab appointment with the [facility], please come in thru the Main Entrance and check in at the main information desk. ? ?Wear comfortable clothing and clothing appropriate for easy access to any Portacath or PICC line.  ? ?We strive to give you quality time with your provider. You may need to reschedule your appointment if you arrive late (15 or more minutes).  Arriving late affects you and other patients whose appointments are after yours.  Also, if you miss three or more appointments without notifying the office, you may be dismissed from the clinic at the provider?s discretion.    ?  ?For prescription refill requests, have your pharmacy contact our office and allow 72 hours for refills to be completed.   ? ?Today you received the following chemotherapy and/or immunotherapy agents Velcade    ?  ?To help prevent nausea and vomiting after your treatment, we encourage you to take your nausea medication as directed. ? ?BELOW ARE SYMPTOMS THAT SHOULD BE REPORTED IMMEDIATELY: ?*FEVER GREATER THAN 100.4 F (38 ?C) OR HIGHER ?*CHILLS OR SWEATING ?*NAUSEA AND VOMITING THAT IS NOT CONTROLLED WITH YOUR NAUSEA MEDICATION ?*UNUSUAL SHORTNESS OF BREATH ?*UNUSUAL BRUISING OR BLEEDING ?*URINARY PROBLEMS (pain or burning when urinating, or frequent urination) ?*BOWEL PROBLEMS (unusual diarrhea, constipation, pain near the anus) ?TENDERNESS IN MOUTH AND THROAT WITH OR WITHOUT PRESENCE OF ULCERS (sore throat, sores in mouth, or a toothache) ?UNUSUAL RASH, SWELLING OR PAIN  ?UNUSUAL VAGINAL DISCHARGE OR ITCHING  ? ?Items with * indicate a potential emergency and should be followed up as soon as possible or go to the Emergency Department if any problems should occur. ? ?Please show the CHEMOTHERAPY ALERT CARD or IMMUNOTHERAPY ALERT CARD at check-in to the Emergency  Department and triage nurse. ? ?Should you have questions after your visit or need to cancel or reschedule your appointment, please contact [facility] [phone]  and follow the prompts.  Office hours are 8:00 a.m. to 4:30 p.m. Monday - Friday. Please note that voicemails left after 4:00 p.m. may not be returned until the following business day.  We are closed weekends and major holidays. You have access to a nurse at all times for urgent questions. Please call the main number to the clinic [phone] and follow the prompts. ? ?For any non-urgent questions, you may also contact your provider using MyChart. We now offer e-Visits for anyone 41 and older to request care online for non-urgent symptoms. For details visit [email or web address]. ?  ?Also download the MyChart app! Go to the app store, search "MyChart", open the app, select [facility] [facility], and log in with your MyChart username and password. ? ?Due to Covid, a mask is required upon entering the hospital/clinic. If you do not have a mask, one will be given to you upon arrival. For doctor visits, patients may have 1 support person aged 33 or older with them. For treatment visits, patients cannot have anyone with them due to current Covid guidelines and our immunocompromised population.  ?

## 2021-07-22 NOTE — Progress Notes (Signed)
Carly Wood presents today for Velcade injection per the provider's orders.  Stable during administration without incident; injection site WNL; see MAR for injection details.  Patient tolerated procedure well and without incident.  No questions or complaints noted at this time.  ?Discharge from clinic via wheelchair in stable condition.  Alert and oriented X 3.  Follow up with [facility] as scheduled.  ?

## 2021-07-23 DIAGNOSIS — Z992 Dependence on renal dialysis: Secondary | ICD-10-CM | POA: Diagnosis not present

## 2021-07-23 DIAGNOSIS — N186 End stage renal disease: Secondary | ICD-10-CM | POA: Diagnosis not present

## 2021-07-23 DIAGNOSIS — N2581 Secondary hyperparathyroidism of renal origin: Secondary | ICD-10-CM | POA: Diagnosis not present

## 2021-07-23 DIAGNOSIS — N250 Renal osteodystrophy: Secondary | ICD-10-CM | POA: Diagnosis not present

## 2021-07-23 DIAGNOSIS — E559 Vitamin D deficiency, unspecified: Secondary | ICD-10-CM | POA: Diagnosis not present

## 2021-07-23 MED ORDER — LANREOTIDE ACETATE 120 MG/0.5ML ~~LOC~~ SOLN
SUBCUTANEOUS | Status: AC
  Filled 2021-07-23: qty 120

## 2021-07-25 DIAGNOSIS — E559 Vitamin D deficiency, unspecified: Secondary | ICD-10-CM | POA: Diagnosis not present

## 2021-07-25 DIAGNOSIS — N250 Renal osteodystrophy: Secondary | ICD-10-CM | POA: Diagnosis not present

## 2021-07-25 DIAGNOSIS — N2581 Secondary hyperparathyroidism of renal origin: Secondary | ICD-10-CM | POA: Diagnosis not present

## 2021-07-25 DIAGNOSIS — N186 End stage renal disease: Secondary | ICD-10-CM | POA: Diagnosis not present

## 2021-07-25 DIAGNOSIS — Z992 Dependence on renal dialysis: Secondary | ICD-10-CM | POA: Diagnosis not present

## 2021-07-28 DIAGNOSIS — E559 Vitamin D deficiency, unspecified: Secondary | ICD-10-CM | POA: Diagnosis not present

## 2021-07-28 DIAGNOSIS — N250 Renal osteodystrophy: Secondary | ICD-10-CM | POA: Diagnosis not present

## 2021-07-28 DIAGNOSIS — Z992 Dependence on renal dialysis: Secondary | ICD-10-CM | POA: Diagnosis not present

## 2021-07-28 DIAGNOSIS — N186 End stage renal disease: Secondary | ICD-10-CM | POA: Diagnosis not present

## 2021-07-28 DIAGNOSIS — N2581 Secondary hyperparathyroidism of renal origin: Secondary | ICD-10-CM | POA: Diagnosis not present

## 2021-07-29 ENCOUNTER — Encounter: Payer: Self-pay | Admitting: Internal Medicine

## 2021-07-29 ENCOUNTER — Non-Acute Institutional Stay (SKILLED_NURSING_FACILITY): Payer: Medicare PPO | Admitting: Internal Medicine

## 2021-07-29 DIAGNOSIS — N186 End stage renal disease: Secondary | ICD-10-CM

## 2021-07-29 DIAGNOSIS — Z992 Dependence on renal dialysis: Secondary | ICD-10-CM | POA: Diagnosis not present

## 2021-07-29 DIAGNOSIS — I120 Hypertensive chronic kidney disease with stage 5 chronic kidney disease or end stage renal disease: Secondary | ICD-10-CM | POA: Diagnosis not present

## 2021-07-29 DIAGNOSIS — E1122 Type 2 diabetes mellitus with diabetic chronic kidney disease: Secondary | ICD-10-CM | POA: Diagnosis not present

## 2021-07-29 DIAGNOSIS — E43 Unspecified severe protein-calorie malnutrition: Secondary | ICD-10-CM

## 2021-07-29 DIAGNOSIS — D631 Anemia in chronic kidney disease: Secondary | ICD-10-CM | POA: Diagnosis not present

## 2021-07-29 DIAGNOSIS — F01518 Vascular dementia, unspecified severity, with other behavioral disturbance: Secondary | ICD-10-CM

## 2021-07-29 NOTE — Assessment & Plan Note (Signed)
Clinically she appears adequately nourished.  Her total protein and albumin are current and within normal limits.

## 2021-07-29 NOTE — Progress Notes (Unsigned)
NURSING HOME LOCATION:  [facility] ROOM NUMBER:  [id number] D  CODE STATUS:  DNR  PCP:  Ok Edwards NP,PSC  This is a nursing facility follow up visit of chronic medical diagnoses & to document compliance with Regulation 483.30 (c) in The [place] Manual Phase 2 which mandates caregiver visit ( visits can alternate among physician, PA or NP as per statutes) within 10 days of 30 days / 60 days/ 90 days post admission to SNF date    Interim medical record and care since last SNF visit was updated with review of diagnostic studies and change in clinical status since last visit were documented.  HPI: She is a permanent resident of the facility with medical diagnoses of breast cancer on oral chemotherapy, essential hypertension, dyslipidemia, GERD, mood disorder, multiple myeloma, and DM with ESRD on HD. Surgeries and procedures include partial mastectomy, EGD with esophageal dilation, and multiple vascular access procedures related to ESRD.  Between the monitor of her multiple myeloma and ESRD; labs are checked frequently.  On 07/22/2021 creatinine was 6.81 with GFR of 6.  This is serially stable.  Total protein was 6.7 and albumin 3.6.  CBC reveals H/H ranges from 8.8/27.7 up to 10.6/33.5.  Macrocytosis is present with an MCV as high as 104.8.  The last B12 on record was 1126 on 10/13/2019.  Intermittently she has exhibited minor hypochromia.  Significant elevation in kappa free light chains as well as lambda free light change is persistently documented.  Most recent A1c was 6.1% on 03/27/2021.  This had increased from a prior value of 5.6%.  Glucose range has been as low as 82 up to high of 232.  These are outliers and the majority of glucoses are in the 150 range.  Review of systems: Dementia invalidated responses.  She confabulated about left shoulder pain which started today and then amended to be "a few minutes ago".  Initially she stated that she had been given medicine  for this; but then she stated it just began.  She denies any injury.  There are no mitigating factors.  She had no other complaints.  She could not convey what was being done for the multiple myeloma.  Constitutional: No fever, significant weight change, fatigue  Eyes: No redness, discharge, pain, vision change ENT/mouth: No nasal congestion,  purulent discharge, earache, change in hearing, sore throat  Cardiovascular: No chest pain, palpitations, paroxysmal nocturnal dyspnea, claudication, edema  Respiratory: No cough, sputum production, hemoptysis, DOE, significant snoring, apnea   Gastrointestinal: No heartburn, dysphagia, abdominal pain, nausea /vomiting, rectal bleeding, melena, change in bowels Genitourinary: No dysuria, hematuria, pyuria, incontinence, nocturia Dermatologic: No rash, pruritus, change in appearance of skin Neurologic: No dizziness, headache, syncope, seizures, numbness, tingling Psychiatric: No significant anxiety, depression, insomnia, anorexia Endocrine: No change in hair/skin/nails, excessive thirst, excessive hunger, excessive urination  Hematologic/lymphatic: No significant bruising, lymphadenopathy, abnormal bleeding Allergy/immunology: No itchy/watery eyes, significant sneezing, urticaria, angioedema  Physical exam:  Pertinent or positive findings: She appears her stated age.  Scalp line has regressed causing pattern alopecia.  Teeth are coated.  Breath sounds are decreased.  Heart sounds are somewhat distant.  Pedal pulses are decreased.  She has nonpitting edema despite wearing a compression type stocking.  She does have crepitus in the left shoulder.  She is able to raise both arms above the head without subjective pain.  General appearance: Adequately nourished; no acute distress, increased work of breathing is present.   Lymphatic: No  lymphadenopathy about the head, neck, axilla. Eyes: No conjunctival inflammation or lid edema is present. There is no scleral  icterus. Ears:  External ear exam shows no significant lesions or deformities.   Nose:  External nasal examination shows no deformity or inflammation. Nasal mucosa are pink and moist without lesions, exudates Oral exam:  There is no oropharyngeal erythema or exudate. Neck:  No thyromegaly, masses, tenderness noted.    Heart:  Normal rate and regular rhythm. S1 and S2 normal without gallop, murmur, click, rub .  Lungs:  without wheezes, rhonchi, rales, rubs. Abdomen: Bowel sounds are normal. Abdomen is soft and nontender with no organomegaly, hernias, masses. GU: Deferred  Extremities:  No cyanosis, clubbing  Neurologic exam :Balance, Rhomberg, finger to nose testing could not be completed due to clinical state Skin: Warm & dry w/o tenting. No significant lesions or rash.  See summary under each active problem in the Problem List with associated updated therapeutic plan

## 2021-07-29 NOTE — Assessment & Plan Note (Signed)
She has no comprehension of the multiple myeloma and its treatment.  She confabulates about shoulder pain.  No behavioral issues reported.

## 2021-07-29 NOTE — Assessment & Plan Note (Signed)
BP controlled; no change in antihypertensive medications  

## 2021-07-29 NOTE — Assessment & Plan Note (Signed)
H/H ranges from 8.8/27.7 up to 10.6/33.5.  Hematology obviously monitors this closely as part of the treatment of the multiple myeloma.  Although she has had macrocytosis; the last B12 level was supranormal.

## 2021-07-29 NOTE — Patient Instructions (Signed)
See assessment and plan under each diagnosis in the problem list and acutely for this visit 

## 2021-07-29 NOTE — Assessment & Plan Note (Signed)
Glucoses have ranged from a low of 82 up to a high of 232.  Both these values are outliers as most glucoses are in the range of 150.  A1c was 6.1% in January, up from a prior value of 5.6%.  No change indicated as long as the A1c stays less than 8% and no hypoglycemia is reported.

## 2021-07-30 DIAGNOSIS — E559 Vitamin D deficiency, unspecified: Secondary | ICD-10-CM | POA: Diagnosis not present

## 2021-07-30 DIAGNOSIS — Z992 Dependence on renal dialysis: Secondary | ICD-10-CM | POA: Diagnosis not present

## 2021-07-30 DIAGNOSIS — N186 End stage renal disease: Secondary | ICD-10-CM | POA: Diagnosis not present

## 2021-07-30 DIAGNOSIS — N250 Renal osteodystrophy: Secondary | ICD-10-CM | POA: Diagnosis not present

## 2021-07-30 DIAGNOSIS — N2581 Secondary hyperparathyroidism of renal origin: Secondary | ICD-10-CM | POA: Diagnosis not present

## 2021-08-01 ENCOUNTER — Other Ambulatory Visit (HOSPITAL_COMMUNITY): Payer: Self-pay

## 2021-08-01 DIAGNOSIS — E559 Vitamin D deficiency, unspecified: Secondary | ICD-10-CM | POA: Diagnosis not present

## 2021-08-01 DIAGNOSIS — N2581 Secondary hyperparathyroidism of renal origin: Secondary | ICD-10-CM | POA: Diagnosis not present

## 2021-08-01 DIAGNOSIS — N186 End stage renal disease: Secondary | ICD-10-CM | POA: Diagnosis not present

## 2021-08-01 DIAGNOSIS — N184 Chronic kidney disease, stage 4 (severe): Secondary | ICD-10-CM

## 2021-08-01 DIAGNOSIS — Z992 Dependence on renal dialysis: Secondary | ICD-10-CM | POA: Diagnosis not present

## 2021-08-01 DIAGNOSIS — N250 Renal osteodystrophy: Secondary | ICD-10-CM | POA: Diagnosis not present

## 2021-08-01 DIAGNOSIS — C9000 Multiple myeloma not having achieved remission: Secondary | ICD-10-CM

## 2021-08-04 DIAGNOSIS — Z992 Dependence on renal dialysis: Secondary | ICD-10-CM | POA: Diagnosis not present

## 2021-08-04 DIAGNOSIS — N186 End stage renal disease: Secondary | ICD-10-CM | POA: Diagnosis not present

## 2021-08-04 DIAGNOSIS — N2581 Secondary hyperparathyroidism of renal origin: Secondary | ICD-10-CM | POA: Diagnosis not present

## 2021-08-04 DIAGNOSIS — N250 Renal osteodystrophy: Secondary | ICD-10-CM | POA: Diagnosis not present

## 2021-08-04 DIAGNOSIS — E559 Vitamin D deficiency, unspecified: Secondary | ICD-10-CM | POA: Diagnosis not present

## 2021-08-05 ENCOUNTER — Inpatient Hospital Stay (HOSPITAL_COMMUNITY): Payer: Medicare PPO

## 2021-08-05 VITALS — BP 129/72 | HR 73 | Temp 98.5°F | Resp 18 | Ht 63.0 in | Wt 137.2 lb

## 2021-08-05 DIAGNOSIS — C9000 Multiple myeloma not having achieved remission: Secondary | ICD-10-CM

## 2021-08-05 DIAGNOSIS — N184 Chronic kidney disease, stage 4 (severe): Secondary | ICD-10-CM

## 2021-08-05 DIAGNOSIS — Z5111 Encounter for antineoplastic chemotherapy: Secondary | ICD-10-CM | POA: Diagnosis not present

## 2021-08-05 LAB — CBC WITH DIFFERENTIAL/PLATELET
Abs Immature Granulocytes: 0.04 10*3/uL (ref 0.00–0.07)
Basophils Absolute: 0 10*3/uL (ref 0.0–0.1)
Basophils Relative: 1 %
Eosinophils Absolute: 0.3 10*3/uL (ref 0.0–0.5)
Eosinophils Relative: 4 %
HCT: 31.1 % — ABNORMAL LOW (ref 36.0–46.0)
Hemoglobin: 9.8 g/dL — ABNORMAL LOW (ref 12.0–15.0)
Immature Granulocytes: 1 %
Lymphocytes Relative: 16 %
Lymphs Abs: 1.1 10*3/uL (ref 0.7–4.0)
MCH: 32.6 pg (ref 26.0–34.0)
MCHC: 31.5 g/dL (ref 30.0–36.0)
MCV: 103.3 fL — ABNORMAL HIGH (ref 80.0–100.0)
Monocytes Absolute: 0.6 10*3/uL (ref 0.1–1.0)
Monocytes Relative: 8 %
Neutro Abs: 4.9 10*3/uL (ref 1.7–7.7)
Neutrophils Relative %: 70 %
Platelets: 243 10*3/uL (ref 150–400)
RBC: 3.01 MIL/uL — ABNORMAL LOW (ref 3.87–5.11)
RDW: 16.7 % — ABNORMAL HIGH (ref 11.5–15.5)
WBC: 6.9 10*3/uL (ref 4.0–10.5)
nRBC: 0 % (ref 0.0–0.2)

## 2021-08-05 LAB — COMPREHENSIVE METABOLIC PANEL
ALT: 13 U/L (ref 0–44)
AST: 17 U/L (ref 15–41)
Albumin: 3.7 g/dL (ref 3.5–5.0)
Alkaline Phosphatase: 73 U/L (ref 38–126)
Anion gap: 11 (ref 5–15)
BUN: 36 mg/dL — ABNORMAL HIGH (ref 8–23)
CO2: 24 mmol/L (ref 22–32)
Calcium: 9.3 mg/dL (ref 8.9–10.3)
Chloride: 102 mmol/L (ref 98–111)
Creatinine, Ser: 6.97 mg/dL — ABNORMAL HIGH (ref 0.44–1.00)
GFR, Estimated: 5 mL/min — ABNORMAL LOW (ref >60)
Glucose, Bld: 89 mg/dL (ref 70–99)
Potassium: 4 mmol/L (ref 3.5–5.1)
Sodium: 137 mmol/L (ref 135–145)
Total Bilirubin: 0.2 mg/dL — ABNORMAL LOW (ref 0.3–1.2)
Total Protein: 6.8 g/dL (ref 6.5–8.1)

## 2021-08-05 LAB — MAGNESIUM: Magnesium: 2.2 mg/dL (ref 1.7–2.4)

## 2021-08-05 MED ORDER — DEXAMETHASONE 4 MG PO TABS
10.0000 mg | ORAL_TABLET | Freq: Once | ORAL | Status: AC
  Administered 2021-08-05: 10 mg via ORAL
  Filled 2021-08-05: qty 3

## 2021-08-05 MED ORDER — PROCHLORPERAZINE MALEATE 10 MG PO TABS
10.0000 mg | ORAL_TABLET | Freq: Once | ORAL | Status: AC
  Administered 2021-08-05: 10 mg via ORAL
  Filled 2021-08-05: qty 1

## 2021-08-05 MED ORDER — BORTEZOMIB CHEMO SQ INJECTION 3.5 MG (2.5MG/ML)
1.3000 mg/m2 | Freq: Once | INTRAMUSCULAR | Status: AC
  Administered 2021-08-05: 2 mg via SUBCUTANEOUS
  Filled 2021-08-05: qty 0.8

## 2021-08-05 NOTE — Patient Instructions (Signed)
[facility]  Discharge Instructions: Thank you for choosing [facility] to provide your oncology and hematology care.  If you have a lab appointment with the [facility], please come in thru the Main Entrance and check in at the main information desk.  Wear comfortable clothing and clothing appropriate for easy access to any Portacath or PICC line.   We strive to give you quality time with your provider. You may need to reschedule your appointment if you arrive late (15 or more minutes).  Arriving late affects you and other patients whose appointments are after yours.  Also, if you miss three or more appointments without notifying the office, you may be dismissed from the clinic at the provider's discretion.      For prescription refill requests, have your pharmacy contact our office and allow 72 hours for refills to be completed.    Today you received the following chemotherapy and/or immunotherapy agents Velcade   To help prevent nausea and vomiting after your treatment, we encourage you to take your nausea medication as directed.  BELOW ARE SYMPTOMS THAT SHOULD BE REPORTED IMMEDIATELY: *FEVER GREATER THAN 100.4 F (38 C) OR HIGHER *CHILLS OR SWEATING *NAUSEA AND VOMITING THAT IS NOT CONTROLLED WITH YOUR NAUSEA MEDICATION *UNUSUAL SHORTNESS OF BREATH *UNUSUAL BRUISING OR BLEEDING *URINARY PROBLEMS (pain or burning when urinating, or frequent urination) *BOWEL PROBLEMS (unusual diarrhea, constipation, pain near the anus) TENDERNESS IN MOUTH AND THROAT WITH OR WITHOUT PRESENCE OF ULCERS (sore throat, sores in mouth, or a toothache) UNUSUAL RASH, SWELLING OR PAIN  UNUSUAL VAGINAL DISCHARGE OR ITCHING   Items with * indicate a potential emergency and should be followed up as soon as possible or go to the Emergency Department if any problems should occur.  Please show the CHEMOTHERAPY ALERT CARD or IMMUNOTHERAPY ALERT CARD at check-in to the Emergency Department  and triage nurse.  Should you have questions after your visit or need to cancel or reschedule your appointment, please contact [facility] [phone]  and follow the prompts.  Office hours are 8:00 a.m. to 4:30 p.m. Monday - Friday. Please note that voicemails left after 4:00 p.m. may not be returned until the following business day.  We are closed weekends and major holidays. You have access to a nurse at all times for urgent questions. Please call the main number to the clinic [phone] and follow the prompts.  For any non-urgent questions, you may also contact your provider using MyChart. We now offer e-Visits for anyone 40 and older to request care online for non-urgent symptoms. For details visit [email or web address].   Also download the MyChart app! Go to the app store, search "MyChart", open the app, select [facility], and log in with your MyChart username and password.  Due to Covid, a mask is required upon entering the hospital/clinic. If you do not have a mask, one will be given to you upon arrival. For doctor visits, patients may have 1 support person aged 65 or older with them. For treatment visits, patients cannot have anyone with them due to current Covid guidelines and our immunocompromised population.

## 2021-08-05 NOTE — Progress Notes (Signed)
Pt presents today for Velcade injection per provider's order. Labs and vital signs WNL for treatment. Okay to proceed with treatment today.  Velcade injection given today per MD orders. Tolerated infusion without adverse affects. Vital signs stable. No complaints at this time. Discharged from clinic via wheelchair in stable condition. Alert and oriented x 3. F/U with [facility] as scheduled.

## 2021-08-06 DIAGNOSIS — Z992 Dependence on renal dialysis: Secondary | ICD-10-CM | POA: Diagnosis not present

## 2021-08-06 DIAGNOSIS — E559 Vitamin D deficiency, unspecified: Secondary | ICD-10-CM | POA: Diagnosis not present

## 2021-08-06 DIAGNOSIS — N250 Renal osteodystrophy: Secondary | ICD-10-CM | POA: Diagnosis not present

## 2021-08-06 DIAGNOSIS — N2581 Secondary hyperparathyroidism of renal origin: Secondary | ICD-10-CM | POA: Diagnosis not present

## 2021-08-06 DIAGNOSIS — N186 End stage renal disease: Secondary | ICD-10-CM | POA: Diagnosis not present

## 2021-08-06 LAB — KAPPA/LAMBDA LIGHT CHAINS
Kappa free light chain: 156.8 mg/L — ABNORMAL HIGH (ref 3.3–19.4)
Kappa, lambda light chain ratio: 0.49 (ref 0.26–1.65)
Lambda free light chains: 319.5 mg/L — ABNORMAL HIGH (ref 5.7–26.3)

## 2021-08-06 MED ORDER — LANREOTIDE ACETATE 120 MG/0.5ML ~~LOC~~ SOLN
SUBCUTANEOUS | Status: AC
  Filled 2021-08-06: qty 240

## 2021-08-07 LAB — PROTEIN ELECTROPHORESIS, SERUM
A/G Ratio: 1.5 (ref 0.7–1.7)
Albumin ELP: 3.7 g/dL (ref 2.9–4.4)
Alpha-1-Globulin: 0.2 g/dL (ref 0.0–0.4)
Alpha-2-Globulin: 0.7 g/dL (ref 0.4–1.0)
Beta Globulin: 0.9 g/dL (ref 0.7–1.3)
Gamma Globulin: 0.6 g/dL (ref 0.4–1.8)
Globulin, Total: 2.5 g/dL (ref 2.2–3.9)
Total Protein ELP: 6.2 g/dL (ref 6.0–8.5)

## 2021-08-08 DIAGNOSIS — N2581 Secondary hyperparathyroidism of renal origin: Secondary | ICD-10-CM | POA: Diagnosis not present

## 2021-08-08 DIAGNOSIS — N250 Renal osteodystrophy: Secondary | ICD-10-CM | POA: Diagnosis not present

## 2021-08-08 DIAGNOSIS — Z992 Dependence on renal dialysis: Secondary | ICD-10-CM | POA: Diagnosis not present

## 2021-08-08 DIAGNOSIS — N186 End stage renal disease: Secondary | ICD-10-CM | POA: Diagnosis not present

## 2021-08-08 DIAGNOSIS — E559 Vitamin D deficiency, unspecified: Secondary | ICD-10-CM | POA: Diagnosis not present

## 2021-08-11 DIAGNOSIS — E559 Vitamin D deficiency, unspecified: Secondary | ICD-10-CM | POA: Diagnosis not present

## 2021-08-11 DIAGNOSIS — N250 Renal osteodystrophy: Secondary | ICD-10-CM | POA: Diagnosis not present

## 2021-08-11 DIAGNOSIS — Z992 Dependence on renal dialysis: Secondary | ICD-10-CM | POA: Diagnosis not present

## 2021-08-11 DIAGNOSIS — N2581 Secondary hyperparathyroidism of renal origin: Secondary | ICD-10-CM | POA: Diagnosis not present

## 2021-08-11 DIAGNOSIS — N186 End stage renal disease: Secondary | ICD-10-CM | POA: Diagnosis not present

## 2021-08-12 ENCOUNTER — Inpatient Hospital Stay (HOSPITAL_COMMUNITY): Payer: Medicare PPO | Attending: Hematology

## 2021-08-12 DIAGNOSIS — Z5111 Encounter for antineoplastic chemotherapy: Secondary | ICD-10-CM | POA: Diagnosis not present

## 2021-08-12 DIAGNOSIS — M8580 Other specified disorders of bone density and structure, unspecified site: Secondary | ICD-10-CM | POA: Insufficient documentation

## 2021-08-12 DIAGNOSIS — C9000 Multiple myeloma not having achieved remission: Secondary | ICD-10-CM | POA: Diagnosis not present

## 2021-08-12 DIAGNOSIS — I120 Hypertensive chronic kidney disease with stage 5 chronic kidney disease or end stage renal disease: Secondary | ICD-10-CM | POA: Insufficient documentation

## 2021-08-12 DIAGNOSIS — Z79811 Long term (current) use of aromatase inhibitors: Secondary | ICD-10-CM | POA: Diagnosis not present

## 2021-08-12 DIAGNOSIS — E1122 Type 2 diabetes mellitus with diabetic chronic kidney disease: Secondary | ICD-10-CM | POA: Diagnosis not present

## 2021-08-12 DIAGNOSIS — C50411 Malignant neoplasm of upper-outer quadrant of right female breast: Secondary | ICD-10-CM | POA: Diagnosis not present

## 2021-08-12 DIAGNOSIS — N184 Chronic kidney disease, stage 4 (severe): Secondary | ICD-10-CM

## 2021-08-12 DIAGNOSIS — Z170 Estrogen receptor positive status [ER+]: Secondary | ICD-10-CM | POA: Diagnosis not present

## 2021-08-12 LAB — CBC WITH DIFFERENTIAL/PLATELET
Abs Immature Granulocytes: 0.02 10*3/uL (ref 0.00–0.07)
Basophils Absolute: 0 10*3/uL (ref 0.0–0.1)
Basophils Relative: 1 %
Eosinophils Absolute: 0.2 10*3/uL (ref 0.0–0.5)
Eosinophils Relative: 3 %
HCT: 31.9 % — ABNORMAL LOW (ref 36.0–46.0)
Hemoglobin: 10 g/dL — ABNORMAL LOW (ref 12.0–15.0)
Immature Granulocytes: 0 %
Lymphocytes Relative: 16 %
Lymphs Abs: 1 10*3/uL (ref 0.7–4.0)
MCH: 32.6 pg (ref 26.0–34.0)
MCHC: 31.3 g/dL (ref 30.0–36.0)
MCV: 103.9 fL — ABNORMAL HIGH (ref 80.0–100.0)
Monocytes Absolute: 0.5 10*3/uL (ref 0.1–1.0)
Monocytes Relative: 7 %
Neutro Abs: 4.6 10*3/uL (ref 1.7–7.7)
Neutrophils Relative %: 73 %
Platelets: 215 10*3/uL (ref 150–400)
RBC: 3.07 MIL/uL — ABNORMAL LOW (ref 3.87–5.11)
RDW: 16.3 % — ABNORMAL HIGH (ref 11.5–15.5)
WBC: 6.3 10*3/uL (ref 4.0–10.5)
nRBC: 0 % (ref 0.0–0.2)

## 2021-08-12 LAB — COMPREHENSIVE METABOLIC PANEL
ALT: 12 U/L (ref 0–44)
AST: 18 U/L (ref 15–41)
Albumin: 3.7 g/dL (ref 3.5–5.0)
Alkaline Phosphatase: 68 U/L (ref 38–126)
Anion gap: 15 (ref 5–15)
BUN: 40 mg/dL — ABNORMAL HIGH (ref 8–23)
CO2: 23 mmol/L (ref 22–32)
Calcium: 9.3 mg/dL (ref 8.9–10.3)
Chloride: 100 mmol/L (ref 98–111)
Creatinine, Ser: 6.52 mg/dL — ABNORMAL HIGH (ref 0.44–1.00)
GFR, Estimated: 6 mL/min — ABNORMAL LOW (ref >60)
Glucose, Bld: 197 mg/dL — ABNORMAL HIGH (ref 70–99)
Potassium: 4.2 mmol/L (ref 3.5–5.1)
Sodium: 138 mmol/L (ref 135–145)
Total Bilirubin: 0.4 mg/dL (ref 0.3–1.2)
Total Protein: 6.7 g/dL (ref 6.5–8.1)

## 2021-08-12 LAB — IMMUNOFIXATION ELECTROPHORESIS
IgA: 269 mg/dL (ref 64–422)
IgG (Immunoglobin G), Serum: 698 mg/dL (ref 586–1602)
IgM (Immunoglobulin M), Srm: 6 mg/dL — ABNORMAL LOW (ref 26–217)
Total Protein ELP: 6.2 g/dL (ref 6.0–8.5)

## 2021-08-12 LAB — IRON AND TIBC
Iron: 93 ug/dL (ref 28–170)
Saturation Ratios: 65 % — ABNORMAL HIGH (ref 10.4–31.8)
TIBC: 142 ug/dL — ABNORMAL LOW (ref 250–450)
UIBC: 49 ug/dL

## 2021-08-12 LAB — FERRITIN: Ferritin: 312 ng/mL — ABNORMAL HIGH (ref 11–307)

## 2021-08-13 DIAGNOSIS — Z992 Dependence on renal dialysis: Secondary | ICD-10-CM | POA: Diagnosis not present

## 2021-08-13 DIAGNOSIS — N186 End stage renal disease: Secondary | ICD-10-CM | POA: Diagnosis not present

## 2021-08-13 DIAGNOSIS — N2581 Secondary hyperparathyroidism of renal origin: Secondary | ICD-10-CM | POA: Diagnosis not present

## 2021-08-13 DIAGNOSIS — N250 Renal osteodystrophy: Secondary | ICD-10-CM | POA: Diagnosis not present

## 2021-08-13 DIAGNOSIS — H31001 Unspecified chorioretinal scars, right eye: Secondary | ICD-10-CM | POA: Diagnosis not present

## 2021-08-13 DIAGNOSIS — E559 Vitamin D deficiency, unspecified: Secondary | ICD-10-CM | POA: Diagnosis not present

## 2021-08-13 DIAGNOSIS — Z961 Presence of intraocular lens: Secondary | ICD-10-CM | POA: Diagnosis not present

## 2021-08-13 DIAGNOSIS — H25812 Combined forms of age-related cataract, left eye: Secondary | ICD-10-CM | POA: Diagnosis not present

## 2021-08-13 DIAGNOSIS — E119 Type 2 diabetes mellitus without complications: Secondary | ICD-10-CM | POA: Diagnosis not present

## 2021-08-13 DIAGNOSIS — H40013 Open angle with borderline findings, low risk, bilateral: Secondary | ICD-10-CM | POA: Diagnosis not present

## 2021-08-13 LAB — KAPPA/LAMBDA LIGHT CHAINS
Kappa free light chain: 157 mg/L — ABNORMAL HIGH (ref 3.3–19.4)
Kappa, lambda light chain ratio: 0.49 (ref 0.26–1.65)
Lambda free light chains: 320.1 mg/L — ABNORMAL HIGH (ref 5.7–26.3)

## 2021-08-14 LAB — PROTEIN ELECTROPHORESIS, SERUM
A/G Ratio: 1.4 (ref 0.7–1.7)
Albumin ELP: 3.7 g/dL (ref 2.9–4.4)
Alpha-1-Globulin: 0.2 g/dL (ref 0.0–0.4)
Alpha-2-Globulin: 0.8 g/dL (ref 0.4–1.0)
Beta Globulin: 0.9 g/dL (ref 0.7–1.3)
Gamma Globulin: 0.6 g/dL (ref 0.4–1.8)
Globulin, Total: 2.6 g/dL (ref 2.2–3.9)
Total Protein ELP: 6.3 g/dL (ref 6.0–8.5)

## 2021-08-15 DIAGNOSIS — N186 End stage renal disease: Secondary | ICD-10-CM | POA: Diagnosis not present

## 2021-08-15 DIAGNOSIS — N2581 Secondary hyperparathyroidism of renal origin: Secondary | ICD-10-CM | POA: Diagnosis not present

## 2021-08-15 DIAGNOSIS — N250 Renal osteodystrophy: Secondary | ICD-10-CM | POA: Diagnosis not present

## 2021-08-15 DIAGNOSIS — E559 Vitamin D deficiency, unspecified: Secondary | ICD-10-CM | POA: Diagnosis not present

## 2021-08-15 DIAGNOSIS — Z992 Dependence on renal dialysis: Secondary | ICD-10-CM | POA: Diagnosis not present

## 2021-08-18 DIAGNOSIS — Z992 Dependence on renal dialysis: Secondary | ICD-10-CM | POA: Diagnosis not present

## 2021-08-18 DIAGNOSIS — N250 Renal osteodystrophy: Secondary | ICD-10-CM | POA: Diagnosis not present

## 2021-08-18 DIAGNOSIS — N186 End stage renal disease: Secondary | ICD-10-CM | POA: Diagnosis not present

## 2021-08-18 DIAGNOSIS — E559 Vitamin D deficiency, unspecified: Secondary | ICD-10-CM | POA: Diagnosis not present

## 2021-08-18 DIAGNOSIS — N2581 Secondary hyperparathyroidism of renal origin: Secondary | ICD-10-CM | POA: Diagnosis not present

## 2021-08-19 ENCOUNTER — Inpatient Hospital Stay (HOSPITAL_COMMUNITY): Payer: Medicare PPO

## 2021-08-19 ENCOUNTER — Inpatient Hospital Stay (HOSPITAL_BASED_OUTPATIENT_CLINIC_OR_DEPARTMENT_OTHER): Payer: Medicare PPO | Admitting: Hematology

## 2021-08-19 VITALS — BP 96/48 | HR 70 | Temp 98.1°F | Resp 18

## 2021-08-19 DIAGNOSIS — Z170 Estrogen receptor positive status [ER+]: Secondary | ICD-10-CM | POA: Diagnosis not present

## 2021-08-19 DIAGNOSIS — N184 Chronic kidney disease, stage 4 (severe): Secondary | ICD-10-CM

## 2021-08-19 DIAGNOSIS — C9000 Multiple myeloma not having achieved remission: Secondary | ICD-10-CM | POA: Diagnosis not present

## 2021-08-19 DIAGNOSIS — M8580 Other specified disorders of bone density and structure, unspecified site: Secondary | ICD-10-CM | POA: Diagnosis not present

## 2021-08-19 DIAGNOSIS — Z79811 Long term (current) use of aromatase inhibitors: Secondary | ICD-10-CM | POA: Diagnosis not present

## 2021-08-19 DIAGNOSIS — I120 Hypertensive chronic kidney disease with stage 5 chronic kidney disease or end stage renal disease: Secondary | ICD-10-CM | POA: Diagnosis not present

## 2021-08-19 DIAGNOSIS — C50411 Malignant neoplasm of upper-outer quadrant of right female breast: Secondary | ICD-10-CM | POA: Diagnosis not present

## 2021-08-19 DIAGNOSIS — Z5111 Encounter for antineoplastic chemotherapy: Secondary | ICD-10-CM | POA: Diagnosis not present

## 2021-08-19 DIAGNOSIS — E1122 Type 2 diabetes mellitus with diabetic chronic kidney disease: Secondary | ICD-10-CM | POA: Diagnosis not present

## 2021-08-19 MED ORDER — BORTEZOMIB CHEMO SQ INJECTION 3.5 MG (2.5MG/ML)
1.3000 mg/m2 | Freq: Once | INTRAMUSCULAR | Status: AC
  Administered 2021-08-19: 2 mg via SUBCUTANEOUS
  Filled 2021-08-19: qty 0.8

## 2021-08-19 MED ORDER — PROCHLORPERAZINE MALEATE 10 MG PO TABS
10.0000 mg | ORAL_TABLET | Freq: Once | ORAL | Status: AC
  Administered 2021-08-19: 10 mg via ORAL
  Filled 2021-08-19: qty 1

## 2021-08-19 MED ORDER — DEXAMETHASONE 4 MG PO TABS
10.0000 mg | ORAL_TABLET | Freq: Once | ORAL | Status: AC
  Administered 2021-08-19: 10 mg via ORAL
  Filled 2021-08-19: qty 3

## 2021-08-19 NOTE — Progress Notes (Signed)
Labs reviewed with MD at office visit today. Ok to proceed with treatment today per MD

## 2021-08-19 NOTE — Progress Notes (Signed)
Patient tolerated Velcade injection with no complaints voiced. Lab work reviewed. See MAR for details. Injection site clean and dry with no bruising or swelling noted. Patient stable during and after injection. Band aid applied. VSS. Patient left in satisfactory condition with no s/s of distress noted. 

## 2021-08-19 NOTE — Progress Notes (Signed)
[place], [place] [address]   CLINIC:  Medical Oncology/Hematology  PCP:  Gerlene Fee, NP [address] [place] [address] [phone]   REASON FOR VISIT:  Follow-up for plasma cell myeloma and anemia  PRIOR THERAPY: none  NGS Results: not done  CURRENT THERAPY: Velcade & Decadron 3/4 weeks  BRIEF ONCOLOGIC HISTORY:  Oncology History  Stage 1 infiltrating ductal carcinoma of right female breast ([place])  09/12/2014 Mammogram   Mass in upper outer R breast, middle third depth appears slightly larger than it was on prior exam with more irreg spiculated margins   10/02/2014 Pathology Results   biopsy with invasive ductal carcinoma high grade 1.1 cm, dcis solid type. additional R breast tissue, excision, invasive ductal high grade 0.9 cm    10/24/2014 Pathology Results   no residual invasive carcinoma, DCIS, focal, atypical ductal hyperplasia, 0/7 LN positive for metastatic carcinoma ER > 90%, PR 30%, HER 2 2+   10/24/2014 Cancer Staging   T1cN0M0   10/24/2014 Surgery   R mastectomy, T1c, N0   12/28/2014 - 11/22/2015 Chemotherapy   Taxol/herceptin weekly X 12, Herceptin every 21 days, last due in September 2017   03/11/2015 -  Anti-estrogen oral therapy   Arimidex 1 mg daily   09/12/2015 Imaging   MUGA- The left ventricular ejection fraction equals 68%.   06/08/2016 Treatment Plan Change   Started Nerlynx   Multiple myeloma ([place])  10/04/2018 Initial Diagnosis   Multiple myeloma ([place])   10/11/2018 -  Chemotherapy   Patient is on Treatment Plan : MYELOMA NON-TRANSPLANT CANDIDATES VRd weekly d21d        CANCER STAGING:  Cancer Staging  Stage 1 infiltrating ductal carcinoma of right female breast [facility]) Staging form: Breast, AJCC 7th Edition - Clinical stage from 08/30/2015: Stage IA (T1c, N0, M0) - Signed by Baird Cancer, PA-C on 08/30/2015   INTERVAL HISTORY:  Carly Wood, a 86 y.o. female, returns for routine  follow-up and consideration for next cycle of chemotherapy. Carly Wood was last seen on 06/19/2021.  Due for cycle #37 of VRd today.   Overall, she tells me she has been feeling pretty well. She reports numbness in her right fingers. She denies recent infections and diarrhea. She reports soreness in her right leg.   Overall, she feels ready for next cycle of chemo today.    REVIEW OF SYSTEMS:  Review of Systems  Constitutional:  Negative for appetite change and fatigue.  Respiratory:  Positive for cough.   Neurological:  Positive for numbness (R fingers).  All other systems reviewed and are negative.   PAST MEDICAL/SURGICAL HISTORY:  Past Medical History:  Diagnosis Date   Anemia     chronic macrocytic anemia   Anxiety    Chronic kidney disease    Chronic renal disease, stage 4, severely decreased glomerular filtration rate (GFR) between 15-29 mL/min/1.73 square meter ([place]) 51/04/5850   Complication of anesthesia    delirious after Breast Surgery   Dementia ([place])    mild   Depression    Diabetes mellitus with ESRD (end-stage renal disease) ([place])    type II   Dysphagia    Dyspnea    with activity   GERD (gastroesophageal reflux disease)    Glaucoma    Hyperlipidemia    Hypertension    Pneumonia    Stage 1 infiltrating ductal carcinoma of right female breast ([place]) 08/21/2015   ER+ PR+ HER 2  neu + (3+) T1cN0    Past Surgical History:  Procedure Laterality Date   A/V FISTULAGRAM Right 07/05/2020   Procedure: A/V Fistulagram;  Surgeon: Elam Dutch, MD;  Location: [facility] LAB;  Service: Cardiovascular;  Laterality: Right;   AV FISTULA PLACEMENT Left 11/22/2017   Procedure: ARTERIOVENOUS (AV) FISTULA CREATION LEFT ARM;  Surgeon: Elam Dutch, MD;  Location: [facility] OR;  Service: Vascular;  Laterality: Left;   AV FISTULA PLACEMENT Right 04/04/2020   Procedure: RIGHT ARM ARTERIOVENOUS FISTULA CREATION;  Surgeon: Rosetta Posner, MD;  Location: [facility] ORS;  Service: Vascular;   Laterality: Right;   AV FISTULA PLACEMENT Right 08/20/2020   Procedure: ARTERIOVENOUS (AV) FISTULA LIGATION RIGHT ARM;  Surgeon: Elam Dutch, MD;  Location: [facility] OR;  Service: Vascular;  Laterality: Right;   BIOPSY  08/07/2016   Procedure: BIOPSY;  Surgeon: Daneil Dolin, MD;  [facility];  Service: Endoscopy;;  gastric ulcer biopsy   COLONOSCOPY     ESOPHAGOGASTRODUODENOSCOPY N/A 08/07/2016   LA Grade A esophagitis s/p dilation, small hiatal hernia, multiple gastric ulcers and erosions, duodenal erosions s/p biopsy. Negative H.pylori    ESOPHAGOGASTRODUODENOSCOPY N/A 11/27/2016   normal esophagus, previously noted gastric ulcers completely healed, normal duodenum.    ESOPHAGOGASTRODUODENOSCOPY (EGD) WITH PROPOFOL N/A 07/23/2020   Procedure: ESOPHAGOGASTRODUODENOSCOPY (EGD) WITH PROPOFOL;  Surgeon: Rogene Houston, MD;  [facility];  Service: Endoscopy;  Laterality: N/A;   FISTULA SUPERFICIALIZATION Left 02/14/2018   Procedure: FISTULA SUPERFICIALIZATION LEFT ARM;  Surgeon: Angelia Mould, MD;  Location: [facility] OR;  Service: Vascular;  Laterality: Left;   FRACTURE SURGERY Right    ankle   HOT HEMOSTASIS  07/23/2020   Procedure: HOT HEMOSTASIS (ARGON PLASMA COAGULATION/BICAP);  Surgeon: Rogene Houston, MD;  [facility];  Service: Endoscopy;;   INTRAMEDULLARY (IM) NAIL INTERTROCHANTERIC Right 07/12/2020   Procedure: INTRAMEDULLARY (IM) NAIL INTERTROCHANTRIC;  Surgeon: Mordecai Rasmussen, MD;  Location: [facility];  Service: Orthopedics;  Laterality: Right;   MALONEY DILATION N/A 08/07/2016   Procedure: Venia Minks DILATION;  Surgeon: Daneil Dolin, MD;  [facility];  Service: Endoscopy;  Laterality: N/A;   MASTECTOMY, PARTIAL Right    multiple myeloma     PERIPHERAL VASCULAR BALLOON ANGIOPLASTY Left 07/13/2019   Procedure: PERIPHERAL VASCULAR BALLOON ANGIOPLASTY;  Surgeon: Marty Heck, MD;  Location: [facility];  Service: Cardiovascular;   Laterality: Left;  arm fistulogram   PERIPHERAL VASCULAR BALLOON ANGIOPLASTY Right 05/22/2020   Procedure: PERIPHERAL VASCULAR BALLOON ANGIOPLASTY;  Surgeon: Cherre Robins, MD;  Location: [facility];  Service: Cardiovascular;  Laterality: Right;  arm fistula   PERIPHERAL VASCULAR BALLOON ANGIOPLASTY Right 07/05/2020   Procedure: PERIPHERAL VASCULAR BALLOON ANGIOPLASTY;  Surgeon: Elam Dutch, MD;  Location: [facility];  Service: Cardiovascular;  Laterality: Right;  arm fistula   PORT-A-CATH REMOVAL Left 11/22/2017   Procedure: REMOVAL PORT-A-CATH LEFT CHEST;  Surgeon: Elam Dutch, MD;  Location: [facility];  Service: Vascular;  Laterality: Left;   RETINAL DETACHMENT SURGERY Right    SCLEROTHERAPY  07/23/2020   Procedure: SCLEROTHERAPY;  Surgeon: Rogene Houston, MD;  [facility];  Service: Endoscopy;;   UPPER EXTREMITY VENOGRAPHY N/A 08/22/2020   Procedure: LEFT UPPER & CENTRAL VENOGRAPHY;  Surgeon: Marty Heck, MD;  Location: [facility];  Service: Cardiovascular;  Laterality: N/A;    SOCIAL HISTORY:  Social History   Socioeconomic History  Marital status: Single    Spouse name: Not on file   Number of children: Not on file   Years of education: Not on file   Highest education level: Not on file  Occupational History   Occupation: retired   Tobacco Use   Smoking status: Never   Smokeless tobacco: Never  Vaping Use   Vaping Use: Never used  Substance and Sexual Activity   Alcohol use: No    Alcohol/week: 0.0 standard drinks of alcohol   Drug use: No   Sexual activity: Never  Other Topics Concern   Not on file  Social History Narrative   Long term resident of SNF    Social Determinants of Health   Financial Resource Strain: [place]  (01/09/2020)   Overall Financial Resource Strain (CARDIA)    Difficulty of Paying Living Expenses: Not very hard  Food Insecurity: No Food Insecurity (01/09/2020)   Hunger Vital Sign    Worried  About Running Out of Food in the Last Year: Never true    Ran Out of Food in the Last Year: Never true  Transportation Needs: No Transportation Needs (01/09/2020)   PRAPARE - Hydrologist (Medical): No    Lack of Transportation (Non-Medical): No  Physical Activity: Inactive (01/09/2020)   Exercise Vital Sign    Days of Exercise per Week: 0 days    Minutes of Exercise per Session: 0 min  Stress: No Stress Concern Present (01/09/2020)   [place]    Feeling of Stress : Not at all  Social Connections: Moderately Isolated (01/09/2020)   Social Connection and Isolation Panel [NHANES]    Frequency of Communication with Friends and Family: More than three times a week    Frequency of Social Gatherings with Friends and Family: Once a week    Attends Religious Services: More than 4 times per year    Active Member of [employer] or Organizations: No    Attends Archivist Meetings: Never    Marital Status: Never married  Intimate Partner Violence: Not At Risk (01/09/2020)   Humiliation, Afraid, Rape, and Kick questionnaire    Fear of Current or Ex-Partner: No    Emotionally Abused: No    Physically Abused: No    Sexually Abused: No    FAMILY HISTORY:  Family History  Problem Relation Age of Onset   Multiple myeloma Sister    Brain cancer Sister    Dementia Mother        died at 42   Stroke Mother    Heart failure Mother    Diabetes Mother    Heart disease Father    Prostate cancer Brother    Colon cancer Neg Hx     CURRENT MEDICATIONS:  No current facility-administered medications for this visit.   Current Outpatient Medications  Medication Sig Dispense Refill   anastrozole (ARIMIDEX) 1 MG tablet TAKE 1 TABLET BY MOUTH DAILY 30 tablet 6   Facility-Administered Medications Ordered in Other Visits  Medication Dose Route Frequency Provider Last Rate Last Admin   lanreotide acetate  (SOMATULINE DEPOT) 120 MG/0.5ML injection            lanreotide acetate (SOMATULINE DEPOT) 120 MG/0.5ML injection            octreotide (SANDOSTATIN LAR) 30 MG IM injection            octreotide (SANDOSTATIN LAR) 30 MG IM injection  ALLERGIES:  Allergies  Allergen Reactions   Ace Inhibitors Other (See Comments) and Cough    Tongue swell , ie angioedema   Angiotensin Receptor Blockers     Angioedema with ACE-I   Penicillins Other (See Comments)    Unsure of reaction Has patient had a PCN reaction causing immediate rash, facial/tongue/throat swelling, SOB or lightheadedness with hypotension: Unknown Has patient had a PCN reaction causing severe rash involving mucus membranes or skin necrosis: Unknown Has patient had a PCN reaction that required hospitalization: No Has patient had a PCN reaction occurring within the last 10 years: Unknown If all of the above answers are "NO", then may proceed with Cephalosporin use.     Penicillin G     PHYSICAL EXAM:  Performance status (ECOG): 2 - Symptomatic, <50% confined to bed  There were no vitals filed for this visit. Wt Readings from Last 3 Encounters:  08/05/21 137 lb 3.2 oz (62.2 kg)  07/22/21 132 lb 1.6 oz (59.9 kg)  07/04/21 136 lb 12.8 oz (62.1 kg)   Physical Exam Vitals reviewed.  Constitutional:      Appearance: Normal appearance.     Comments: In wheelchair  Cardiovascular:     Rate and Rhythm: Normal rate and regular rhythm.     Pulses: Normal pulses.     Heart sounds: Normal heart sounds.  Pulmonary:     Effort: Pulmonary effort is normal.     Breath sounds: Normal breath sounds.  Neurological:     General: No focal deficit present.     Mental Status: She is alert and oriented to person, place, and time.  Psychiatric:        Mood and Affect: Mood normal.        Behavior: Behavior normal.     LABORATORY DATA:  I have reviewed the labs as listed.     Latest Ref Rng & Units 08/12/2021   10:58 AM  08/05/2021    9:08 AM 07/22/2021   10:38 AM  CBC  WBC 4.0 - 10.5 K/uL 6.3  6.9  7.2   Hemoglobin 12.0 - 15.0 g/dL 10.0  9.8  9.5   Hematocrit 36.0 - 46.0 % 31.9  31.1  30.8   Platelets 150 - 400 K/uL 215  243  226       Latest Ref Rng & Units 08/12/2021   10:58 AM 08/05/2021    9:08 AM 07/22/2021   10:38 AM  CMP  Glucose 70 - 99 mg/dL 197  89  145   BUN 8 - 23 mg/dL 40  36  33   Creatinine 0.44 - 1.00 mg/dL 6.52  6.97  6.81   Sodium 135 - 145 mmol/L 138  137  139   Potassium 3.5 - 5.1 mmol/L 4.2  4.0  3.9   Chloride 98 - 111 mmol/L 100  102  99   CO2 22 - 32 mmol/L '23  24  28   ' Calcium 8.9 - 10.3 mg/dL 9.3  9.3  8.9   Total Protein 6.5 - 8.1 g/dL 6.7  6.8  6.7   Total Bilirubin 0.3 - 1.2 mg/dL 0.4  0.2  0.3   Alkaline Phos 38 - 126 U/L 68  73  79   AST 15 - 41 U/L '18  17  19   ' ALT 0 - 44 U/L '12  13  12     ' DIAGNOSTIC IMAGING:  I have independently reviewed the scans and discussed with the patient. No results found.  ASSESSMENT:  1.  IgA lambda plasma cell myeloma, stage II, standard risk: -BMBX on 09/20/2018 shows plasma cell myeloma, 30% plasma cells.  FISH panel was normal.  Chromosome analysis normal. -Labs at diagnosis on 08/29/2018 with M spike 0.9 g.  Kappa light chains 148, lambda light chains 1132, ratio 0.13.  LDH normal.  Beta-2 microglobulin 13.5.  She had transfusion dependent anemia. -Velcade and dexamethasone started on 10/11/2018. -Myeloma panel on 07/11/2019 shows SPEP is negative.  Free light chain ratio improved to 1.09.  Lambda light chains are 128, improved from 141.  Kappa light chains are 140. -She has [facility] resident at [facility] 10/12/[facility].   2.  Stage I IDC of the right breast, ER/PR positive, HER-2 negative: -She is on anastrozole.   PLAN:  1.  IgA lambda plasma cell myeloma: - Reviewed myeloma panel from 08/12/2021: M spike is not detectable.  Lambda light chains are 328 and stable.  Free light chain ratio is 0.49 and stable. - CBC was grossly normal  with hemoglobin 10.  LFTs are normal. - She is tolerating Velcade every other week as maintenance reasonably well.  No worsening neuropathy.  She reports right ankle pain from previous fracture. - No fevers or infections on the current therapy. - Recommend proceeding with Velcade maintenance every 2 weeks. - RTC 8 weeks with repeat myeloma labs 2 weeks prior.   2.  ESRD on HD: - Continue HD on Monday, Wednesday and Friday.   3.  Bone strengthening: - Bisphosphonates not started due to patient being on dialysis.   4.  Osteopenia: - Last bone density is in 2017.  I will repeat it prior to next visit.   5.  Right breast cancer: - Continue anastrozole.  She is tolerating well.   Orders placed this encounter:  No orders of the defined types were placed in this encounter.    Derek Jack, MD [facility] [phone]   I, Thana Ates, am acting as a scribe for Dr. Derek Jack.  I, Derek Jack MD, have reviewed the above documentation for accuracy and completeness, and I agree with the above.

## 2021-08-19 NOTE — Patient Instructions (Signed)
[facility]  Discharge Instructions: Thank you for choosing [facility] to provide your oncology and hematology care.  If you have a lab appointment with the [facility], please come in thru the Main Entrance and check in at the main information desk.  Wear comfortable clothing and clothing appropriate for easy access to any Portacath or PICC line.   We strive to give you quality time with your provider. You may need to reschedule your appointment if you arrive late (15 or more minutes).  Arriving late affects you and other patients whose appointments are after yours.  Also, if you miss three or more appointments without notifying the office, you may be dismissed from the clinic at the provider's discretion.      For prescription refill requests, have your pharmacy contact our office and allow 72 hours for refills to be completed.    Today you received the following chemotherapy and/or immunotherapy agents velcade.  Bortezomib injection What is this medication? BORTEZOMIB (bor TEZ oh mib) targets proteins in cancer cells and stops the cancer cells from growing. It treats multiple myeloma and mantle cell lymphoma. This medicine may be used for other purposes; ask your health care provider or pharmacist if you have questions. COMMON BRAND NAME(S): Velcade What should I tell my care team before I take this medication? They need to know if you have any of these conditions: dehydration diabetes (high blood sugar) heart disease liver disease tingling of the fingers or toes or other nerve disorder an unusual or allergic reaction to bortezomib, mannitol, boron, other medicines, foods, dyes, or preservatives pregnant or trying to get pregnant breast-feeding How should I use this medication? This medicine is injected into a vein or under the skin. It is given by a health care provider in a hospital or clinic setting. Talk to your health care provider about the use of  this medicine in children. Special care may be needed. Overdosage: If you think you have taken too much of this medicine contact a poison control center or emergency room at once. NOTE: This medicine is only for you. Do not share this medicine with others. What if I miss a dose? Keep appointments for follow-up doses. It is important not to miss your dose. Call your health care provider if you are unable to keep an appointment. What may interact with this medication? This medicine may interact with the following medications: ketoconazole rifampin This list may not describe all possible interactions. Give your health care provider a list of all the medicines, herbs, non-prescription drugs, or dietary supplements you use. Also tell them if you smoke, drink alcohol, or use illegal drugs. Some items may interact with your medicine. What should I watch for while using this medication? Your condition will be monitored carefully while you are receiving this medicine. You may need blood work done while you are taking this medicine. You may get drowsy or dizzy. Do not drive, use machinery, or do anything that needs mental alertness until you know how this medicine affects you. Do not stand up or sit up quickly, especially if you are an older patient. This reduces the risk of dizzy or fainting spells This medicine may increase your risk of getting an infection. Call your health care provider for advice if you get a fever, chills, sore throat, or other symptoms of a cold or flu. Do not treat yourself. Try to avoid being around people who are sick. Check with your health care provider  if you have severe diarrhea, nausea, and vomiting, or if you sweat a lot. The loss of too much body fluid may make it dangerous for you to take this medicine. Do not become pregnant while taking this medicine or for 7 months after stopping it. Women should inform their health care provider if they wish to become pregnant or think  they might be pregnant. Men should not father a child while taking this medicine and for 4 months after stopping it. There is a potential for serious harm to an unborn child. Talk to your health care provider for more information. Do not breast-feed an infant while taking this medicine or for 2 months after stopping it. This medicine may make it more difficult to get pregnant or father a child. Talk to your health care provider if you are concerned about your fertility. What side effects may I notice from receiving this medication? Side effects that you should report to your doctor or health care professional as soon as possible: allergic reactions (skin rash; itching or hives; swelling of the face, lips, or tongue) bleeding (bloody or black, tarry stools; red or dark brown urine; spitting up blood or brown material that looks like coffee grounds; red spots on the skin; unusual bruising or bleeding from the eye, gums, or nose) blurred vision or changes in vision confusion constipation headache heart failure (trouble breathing; fast, irregular heartbeat; sudden weight gain; swelling of the ankles, feet, hands) infection (fever, chills, cough, sore throat, pain or trouble passing urine) lack or loss of appetite liver injury (dark yellow or brown urine; general ill feeling or flu-like symptoms; loss of appetite, right upper belly pain; yellowing of the eyes or skin) low blood pressure (dizziness; feeling faint or lightheaded, falls; unusually weak or tired) muscle cramps pain, redness, or irritation at site where injected pain, tingling, numbness in the hands or feet seizures trouble breathing unusual bruising or bleeding Side effects that usually do not require medical attention (report to your doctor or health care professional if they continue or are bothersome): diarrhea nausea stomach pain trouble sleeping vomiting This list may not describe all possible side effects. Call your doctor  for medical advice about side effects. You may report side effects to FDA at 1-800-FDA-1088. Where should I keep my medication? This medicine is given in a hospital or clinic. It will not be stored at home. NOTE: This sheet is a summary. It may not cover all possible information. If you have questions about this medicine, talk to your doctor, pharmacist, or health care provider.  2023 Elsevier/Gold Standard (2020-02-15 00:00:00)       To help prevent nausea and vomiting after your treatment, we encourage you to take your nausea medication as directed.  BELOW ARE SYMPTOMS THAT SHOULD BE REPORTED IMMEDIATELY: *FEVER GREATER THAN 100.4 F (38 C) OR HIGHER *CHILLS OR SWEATING *NAUSEA AND VOMITING THAT IS NOT CONTROLLED WITH YOUR NAUSEA MEDICATION *UNUSUAL SHORTNESS OF BREATH *UNUSUAL BRUISING OR BLEEDING *URINARY PROBLEMS (pain or burning when urinating, or frequent urination) *BOWEL PROBLEMS (unusual diarrhea, constipation, pain near the anus) TENDERNESS IN MOUTH AND THROAT WITH OR WITHOUT PRESENCE OF ULCERS (sore throat, sores in mouth, or a toothache) UNUSUAL RASH, SWELLING OR PAIN  UNUSUAL VAGINAL DISCHARGE OR ITCHING   Items with * indicate a potential emergency and should be followed up as soon as possible or go to the Emergency Department if any problems should occur.  Please show the CHEMOTHERAPY ALERT CARD or IMMUNOTHERAPY ALERT CARD at check-in  to the Emergency Department and triage nurse.  Should you have questions after your visit or need to cancel or reschedule your appointment, please contact [facility] [phone]  and follow the prompts.  Office hours are 8:00 a.m. to 4:30 p.m. Monday - Friday. Please note that voicemails left after 4:00 p.m. may not be returned until the following business day.  We are closed weekends and major holidays. You have access to a nurse at all times for urgent questions. Please call the main number to the clinic [phone] and follow  the prompts.  For any non-urgent questions, you may also contact your provider using MyChart. We now offer e-Visits for anyone 88 and older to request care online for non-urgent symptoms. For details visit [email or web address].   Also download the MyChart app! Go to the app store, search "MyChart", open the app, select [facility], and log in with your MyChart username and password.  Masks are optional in the cancer centers. If you would like for your care team to wear a mask while they are taking care of you, please let them know. For doctor visits, patients may have with them one support person who is at least 84 years old. At this time, visitors are not allowed in the infusion area.

## 2021-08-20 DIAGNOSIS — Z992 Dependence on renal dialysis: Secondary | ICD-10-CM | POA: Diagnosis not present

## 2021-08-20 DIAGNOSIS — N186 End stage renal disease: Secondary | ICD-10-CM | POA: Diagnosis not present

## 2021-08-20 DIAGNOSIS — E559 Vitamin D deficiency, unspecified: Secondary | ICD-10-CM | POA: Diagnosis not present

## 2021-08-20 DIAGNOSIS — N250 Renal osteodystrophy: Secondary | ICD-10-CM | POA: Diagnosis not present

## 2021-08-20 DIAGNOSIS — N2581 Secondary hyperparathyroidism of renal origin: Secondary | ICD-10-CM | POA: Diagnosis not present

## 2021-08-22 DIAGNOSIS — Z992 Dependence on renal dialysis: Secondary | ICD-10-CM | POA: Diagnosis not present

## 2021-08-22 DIAGNOSIS — N186 End stage renal disease: Secondary | ICD-10-CM | POA: Diagnosis not present

## 2021-08-22 DIAGNOSIS — E559 Vitamin D deficiency, unspecified: Secondary | ICD-10-CM | POA: Diagnosis not present

## 2021-08-22 DIAGNOSIS — N2581 Secondary hyperparathyroidism of renal origin: Secondary | ICD-10-CM | POA: Diagnosis not present

## 2021-08-22 DIAGNOSIS — N250 Renal osteodystrophy: Secondary | ICD-10-CM | POA: Diagnosis not present

## 2021-08-25 ENCOUNTER — Non-Acute Institutional Stay (SKILLED_NURSING_FACILITY): Payer: Medicare PPO | Admitting: Adult Health

## 2021-08-25 ENCOUNTER — Encounter: Payer: Self-pay | Admitting: Adult Health

## 2021-08-25 DIAGNOSIS — N250 Renal osteodystrophy: Secondary | ICD-10-CM | POA: Diagnosis not present

## 2021-08-25 DIAGNOSIS — E1122 Type 2 diabetes mellitus with diabetic chronic kidney disease: Secondary | ICD-10-CM

## 2021-08-25 DIAGNOSIS — E559 Vitamin D deficiency, unspecified: Secondary | ICD-10-CM | POA: Diagnosis not present

## 2021-08-25 DIAGNOSIS — N186 End stage renal disease: Secondary | ICD-10-CM

## 2021-08-25 DIAGNOSIS — Z992 Dependence on renal dialysis: Secondary | ICD-10-CM | POA: Diagnosis not present

## 2021-08-25 DIAGNOSIS — F01518 Vascular dementia, unspecified severity, with other behavioral disturbance: Secondary | ICD-10-CM

## 2021-08-25 DIAGNOSIS — N2581 Secondary hyperparathyroidism of renal origin: Secondary | ICD-10-CM | POA: Diagnosis not present

## 2021-08-25 DIAGNOSIS — I70209 Unspecified atherosclerosis of native arteries of extremities, unspecified extremity: Secondary | ICD-10-CM

## 2021-08-25 DIAGNOSIS — I700 Atherosclerosis of aorta: Secondary | ICD-10-CM | POA: Diagnosis not present

## 2021-08-25 NOTE — Progress Notes (Unsigned)
Location:  [facility] Room Number: 148-D Place of Service:  SNF (31)   CODE STATUS: DNR  Allergies  Allergen Reactions   Ace Inhibitors Other (See Comments) and Cough    Tongue swell , ie angioedema   Angiotensin Receptor Blockers     Angioedema with ACE-I   Penicillins Other (See Comments)    Unsure of reaction Has patient had a PCN reaction causing immediate rash, facial/tongue/throat swelling, SOB or lightheadedness with hypotension: Unknown Has patient had a PCN reaction causing severe rash involving mucus membranes or skin necrosis: Unknown Has patient had a PCN reaction that required hospitalization: No Has patient had a PCN reaction occurring within the last 10 years: Unknown If all of the above answers are "NO", then may proceed with Cephalosporin use.     Penicillin G     Chief Complaint  Patient presents with   Medical Management of Chronic Issues                       Aortic atherosclerosis/atherosclerotic peripheral vascular disease:  Vascular dementia with behavioral disturbance:  Diabetes mellitus type 2 with end stage renal disease (ESRD) Anemia due to end stage renal disease     HPI:  She is a 86 year old long term resident of this facility being seen for the management of her chronic illnesses:  Aortic atherosclerosis/atherosclerotic peripheral vascular disease:  Vascular dementia with behavioral disturbance:  Diabetes mellitus type 2 with end stage renal disease (ESRD) Anemia due to end stage renal disease.  There are no reports of uncontrolled pain. She continues with dialysis three days weekly. There are no reports of anxiety.   Past Medical History:  Diagnosis Date   Anemia     chronic macrocytic anemia   Anxiety    Chronic kidney disease    Chronic renal disease, stage 4, severely decreased glomerular filtration rate (GFR) between 15-29 mL/min/1.73 square meter ([place]) 86/57/8469   Complication of anesthesia    delirious after  Breast Surgery   Dementia ([place])    mild   Depression    Diabetes mellitus with ESRD (end-stage renal disease) ([place])    type II   Dysphagia    Dyspnea    with activity   GERD (gastroesophageal reflux disease)    Glaucoma    Hyperlipidemia    Hypertension    Pneumonia    Stage 1 infiltrating ductal carcinoma of right female breast ([place]) 08/21/2015   ER+ PR+ HER 2 neu + (3+) T1cN0     Past Surgical History:  Procedure Laterality Date   A/V FISTULAGRAM Right 07/05/2020   Procedure: A/V Fistulagram;  Surgeon: Elam Dutch, MD;  Location: [facility];  Service: Cardiovascular;  Laterality: Right;   AV FISTULA PLACEMENT Left 11/22/2017   Procedure: ARTERIOVENOUS (AV) FISTULA CREATION LEFT ARM;  Surgeon: Elam Dutch, MD;  Location: [facility] OR;  Service: Vascular;  Laterality: Left;   AV FISTULA PLACEMENT Right 04/04/2020   Procedure: RIGHT ARM ARTERIOVENOUS FISTULA CREATION;  Surgeon: Rosetta Posner, MD;  Location: [facility];  Service: Vascular;  Laterality: Right;   AV FISTULA PLACEMENT Right 08/20/2020   Procedure: ARTERIOVENOUS (AV) FISTULA LIGATION RIGHT ARM;  Surgeon: Elam Dutch, MD;  Location: [facility];  Service: Vascular;  Laterality: Right;   BIOPSY  08/07/2016   Procedure: BIOPSY;  Surgeon: Daneil Dolin, MD;  [facility];  Service: Endoscopy;;  gastric ulcer biopsy   COLONOSCOPY  ESOPHAGOGASTRODUODENOSCOPY N/A 08/07/2016   LA Grade A esophagitis s/p dilation, small hiatal hernia, multiple gastric ulcers and erosions, duodenal erosions s/p biopsy. Negative H.pylori    ESOPHAGOGASTRODUODENOSCOPY N/A 11/27/2016   normal esophagus, previously noted gastric ulcers completely healed, normal duodenum.    ESOPHAGOGASTRODUODENOSCOPY (EGD) WITH PROPOFOL N/A 07/23/2020   Procedure: ESOPHAGOGASTRODUODENOSCOPY (EGD) WITH PROPOFOL;  Surgeon: Rogene Houston, MD;  [facility];  Service: Endoscopy;  Laterality: N/A;   FISTULA SUPERFICIALIZATION Left 02/14/2018    Procedure: FISTULA SUPERFICIALIZATION LEFT ARM;  Surgeon: Angelia Mould, MD;  Location: [facility] OR;  Service: Vascular;  Laterality: Left;   FRACTURE SURGERY Right    ankle   HOT HEMOSTASIS  07/23/2020   Procedure: HOT HEMOSTASIS (ARGON PLASMA COAGULATION/BICAP);  Surgeon: Rogene Houston, MD;  [facility];  Service: Endoscopy;;   INTRAMEDULLARY (IM) NAIL INTERTROCHANTERIC Right 07/12/2020   Procedure: INTRAMEDULLARY (IM) NAIL INTERTROCHANTRIC;  Surgeon: Mordecai Rasmussen, MD;  Location[facility];  Service: Orthopedics;  Laterality: Right;   MALONEY DILATION N/A 08/07/2016   Procedure: Venia Minks DILATION;  Surgeon: Daneil Dolin, MD;  [facility];  Service: Endoscopy;  Laterality: N/A;   MASTECTOMY, PARTIAL Right    multiple myeloma     PERIPHERAL VASCULAR BALLOON ANGIOPLASTY Left 07/13/2019   Procedure: PERIPHERAL VASCULAR BALLOON ANGIOPLASTY;  Surgeon: Marty Heck, MD;  Location: [facility];  Service: Cardiovascular;  Laterality: Left;  arm fistulogram   PERIPHERAL VASCULAR BALLOON ANGIOPLASTY Right 05/22/2020   Procedure: PERIPHERAL VASCULAR BALLOON ANGIOPLASTY;  Surgeon: Cherre Robins, MD;  Location: [facility];  Service: Cardiovascular;  Laterality: Right;  arm fistula   PERIPHERAL VASCULAR BALLOON ANGIOPLASTY Right 07/05/2020   Procedure: PERIPHERAL VASCULAR BALLOON ANGIOPLASTY;  Surgeon: Elam Dutch, MD;  Location: [facility];  Service: Cardiovascular;  Laterality: Right;  arm fistula   PORT-A-CATH REMOVAL Left 11/22/2017   Procedure: REMOVAL PORT-A-CATH LEFT CHEST;  Surgeon: Elam Dutch, MD;  Location: [facility];  Service: Vascular;  Laterality: Left;   RETINAL DETACHMENT SURGERY Right    SCLEROTHERAPY  07/23/2020   Procedure: SCLEROTHERAPY;  Surgeon: Rogene Houston, MD;  [facility];  Service: Endoscopy;;   UPPER EXTREMITY VENOGRAPHY N/A 08/22/2020   Procedure: LEFT UPPER & CENTRAL VENOGRAPHY;  Surgeon: Marty Heck, MD;  Location: [facility];  Service: Cardiovascular;  Laterality: N/A;    Social History   Socioeconomic History   Marital status: Single    Spouse name: Not on file   Number of children: Not on file   Years of education: Not on file   Highest education level: Not on file  Occupational History   Occupation: retired   Tobacco Use   Smoking status: Never   Smokeless tobacco: Never  Vaping Use   Vaping Use: Never used  Substance and Sexual Activity   Alcohol use: No    Alcohol/week: 0.0 standard drinks of alcohol   Drug use: No   Sexual activity: Never  Other Topics Concern   Not on file  Social History Narrative   Long term resident of SNF    Social Determinants of Health   Financial Resource Strain: [place]  (01/09/2020)   Overall Financial Resource Strain (CARDIA)    Difficulty of Paying Living Expenses: Not very hard  Food Insecurity: No Food Insecurity (01/09/2020)   Hunger Vital Sign    Worried About Running Out of Food in the Last Year: Never  true    Ran Out of Food in the Last Year: Never true  Transportation Needs: No Transportation Needs (01/09/2020)   PRAPARE - Hydrologist (Medical): No    Lack of Transportation (Non-Medical): No  Physical Activity: Inactive (01/09/2020)   Exercise Vital Sign    Days of Exercise per Week: 0 days    Minutes of Exercise per Session: 0 min  Stress: No Stress Concern Present (01/09/2020)   [place]    Feeling of Stress : Not at all  Social Connections: Moderately Isolated (01/09/2020)   Social Connection and Isolation Panel [NHANES]    Frequency of Communication with Friends and Family: More than three times a week    Frequency of Social Gatherings with Friends and Family: Once a week    Attends Religious Services: More than 4 times per year    Active Member of [employer] or Organizations: No    Attends Theatre manager Meetings: Never    Marital Status: Never married  Intimate Partner Violence: Not At Risk (01/09/2020)   Humiliation, Afraid, Rape, and Kick questionnaire    Fear of Current or Ex-Partner: No    Emotionally Abused: No    Physically Abused: No    Sexually Abused: No   Family History  Problem Relation Age of Onset   Multiple myeloma Sister    Brain cancer Sister    Dementia Mother        died at 106   Stroke Mother    Heart failure Mother    Diabetes Mother    Heart disease Father    Prostate cancer Brother    Colon cancer Neg Hx       VITAL SIGNS BP 120/60   Pulse 66   Temp 97.7 F (36.5 C)   Resp 16   Ht _0  (1.6 m)   Wt 142 lb 9.6 oz (64.7 kg)   SpO2 97%   BMI 25.26 kg/m   Facility-Administered Encounter Medications as of 08/25/2021  Medication   lanreotide acetate (SOMATULINE DEPOT) 120 MG/0.5ML injection   lanreotide acetate (SOMATULINE DEPOT) 120 MG/0.5ML injection   octreotide (SANDOSTATIN LAR) 30 MG IM injection   octreotide (SANDOSTATIN LAR) 30 MG IM injection   Outpatient Encounter Medications as of 08/25/2021  Medication Sig   acetaminophen (TYLENOL) 325 MG tablet Take 650 mg by mouth every 8 (eight) hours.   acyclovir (ZOVIRAX) 200 MG capsule Take 200 mg by mouth in the morning. (0800)   Amino Acids-Protein Hydrolys (FEEDING SUPPLEMENT, PRO-STAT SUGAR FREE 64,) LIQD Take 30 mLs by mouth 3 (three) times daily with meals. (0800, 1200 & 1800)   anastrozole (ARIMIDEX) 1 MG tablet TAKE 1 TABLET BY MOUTH DAILY   atorvastatin (LIPITOR) 10 MG tablet Take 10 mg by mouth at bedtime. (2000)   Balsam [place]-Castor Oil (VENELEX) OINT Apply topically. Apply to bilateral buttocks and sacrum every shift.   Calcium Acetate 667 MG TABS Take 2 tablets by mouth 2 (two) times daily. 1 tablet daily w/ snack   calcium-vitamin D (OSCAL WITH D) 500-200 MG-UNIT tablet Take 1 tablet by mouth in the morning. (0800)   cloNIDine (CATAPRES) 0.1 MG tablet Take 1 tablet (0.1 mg  total) by mouth at bedtime. (2000) Hold for SBP < 110 bpm   docusate sodium (COLACE) 100 MG capsule Take 100 mg by mouth 2 (two) times daily.   insulin glargine (LANTUS SOLOSTAR) 100 UNIT/ML Solostar Pen Inject  5 Units into the skin at bedtime. (2000)   Insulin Pen Needle (BD AUTOSHIELD DUO) 30G X 5 MM MISC by Does not apply route. 3/16"   melatonin 3 MG TABS tablet Take 6 mg by mouth at bedtime. (2000) For Sleep   multivitamin (RENA-VIT) TABS tablet Take 1 tablet by mouth at bedtime. (2100)   NON FORMULARY Diet-Regular with thin liquids   ondansetron (ZOFRAN) 4 MG tablet Take 1 tablet (4 mg total) by mouth every 6 (six) hours as needed for nausea.   pantoprazole (PROTONIX) 40 MG tablet Take 40 mg by mouth 2 (two) times daily.   sertraline (ZOLOFT) 25 MG tablet Take 25 mg by mouth daily. At 9 am along with 50 mg tablet for a total of 75 mg daily   sertraline (ZOLOFT) 50 MG tablet Take 50 mg by mouth daily. At 9 am along with 25 mg tablet for a total of 75 mg daily   atenolol (TENORMIN) 25 MG tablet Take 1 tablet (25 mg total) by mouth in the morning. (0800)   sucralfate (CARAFATE) 1 GM/10ML suspension Take 1 g by mouth in the morning, at noon, in the evening, and at bedtime. (0800, 1200, 1700 & 2100)     SIGNIFICANT DIAGNOSTIC EXAMS    NO NEW EXAMS.   LABS REVIEWED PREVIOUS   09-03-20: wbc 5.8; hgb 11.2; hct 37.3; mcv 102.8 plt 274; glucose 175; bun 38; creat 5.72; k+ 3.1; na++ 138; ca 9.2 GFR 7 alk phos 138; albumin 3.4 mag 2.0 09-30-20: wbc 5.6; hgb 13.1; hct 42.6; mcv 97.9 plt 260; glucose 92; bun 23; creat 4.18; k+ 3.1; na++ 134; ca 8.7; GFR 10; liver normal albumin 3.8 10-29-20: wbc 5.3; hgb 12.1; hct 40.0; mcv 96.6 plt 218; glucose 190; bun 54; creat 7.35 k+ 4.0; na++ 134; ca 9.4; GFR 5 liver normal albumin 3.7; mag 2.3 11-19-20: wbc 10.9; hgb 11.3; hct 37.2; mcv 96.6 plt 242; glucose 106; bun 47; creat 6.58; k+ 3.9; na++ 133; ca 9.0; GFR 6 liver normal albumin 3.5  12-02-20: hgb a1c 5.6;  chol 167; ldl 83; trig 229; hdl 38 12-03-20; wbc 5.2; hgb 11.1; hct 35.7; mcv 96.5 plt 320; glucose 139; bun 47; creat 6.05; k+ 4.1; na++ 134; ca 9.1; GFR 6; liver normal albumin 3.8 mag 2.4  12-17-20: wbc 5.2; hgb 10.5; hct 33.8; mcv 98.8 plt 256; glucose 136; bun 43; creat 6.19; k+ 3.6; na++ 137; ca 8.9; GFR 6 liver normal albumin 3.9  03-06-21: wbc 4.7; hgb 12.3; hct 38.2; mcv 101.6 plt 221; glucose 205; bun 51; creat 7.06; k+ 4.5; na++ 133; ca 9.1; GFR 5; liver normal albumin 3.8  03-20-21: wbc 5.4; hgb 12.1; hct 37.7; mcv 97.2 plt 254; glucose 111; bun 47; creat 7.34; k+ 4.6; na++ 131; ca 9.6 GFR 5; liver normal albumin 3.9; mag 2.2 03-27-21: hgb a1c 6.1   04-29-21; wbc 5.4; hgb 11.0; hct 34.6;mcv 100.9 plt 179; glucose 82; bun 44; creat 7.04; k+ 4.6; na++ 139; ca 8.7; GFR 5 d-dimer: 1.25; CRP 4.0   05-27-21: wbc 6.0; hgb 10.6; hct 33.5; mcv 101.2 plt 261; glucose 147; bun 45; creat 6.86; k+ 4.5; na++ 138; ca 8.3; GFR 6; protein 6.9; albumin 3.8  06-12-21: wbc 6.5; hgb 8.8; hct 27.7; mcv 101.5 plt 229; glucose 153; bun 40; creat 6.53; k+ 4.3; na++ 136; ca 9.5; GFR 6 protein 6.5; albumin 3.4 mag 2.2   TODAY  07-08-21: wbc 15.7; hgb 9.8; hct 32.8; mcv 104.8; plt 226; glucose 164;  bun 51; creat 7.74; k+ 4.4; na++ 138; ca 9.7; gfr 5 protein 7.5; albumin 3.9 mag 2.1; chol 171; ldl 89; trig 265; hdl 29; hgb a1c 6.1 08-12-21: wbc 6.3; hgb 10.0; hct 31.9; mcv 103.9 plt 215; glucose 197; bun 40;creat 6.25; k+ 4.2; na++138; ca 9.3; gfr 6; protein 6.7; albumin 3.7; iron 93; tibc 142; ferritin 312  Review of Systems  Constitutional:  Negative for malaise/fatigue.  Respiratory:  Negative for cough and shortness of breath.   Cardiovascular:  Negative for chest pain, palpitations and leg swelling.  Gastrointestinal:  Negative for abdominal pain, constipation and heartburn.  Musculoskeletal:  Negative for back pain, joint pain and myalgias.  Skin: Negative.   Neurological:  Negative for dizziness.   Psychiatric/Behavioral:  The patient is not nervous/anxious.     Physical Exam Constitutional:      General: She is not in acute distress.    Appearance: She is well-developed. She is not diaphoretic.  Neck:     Thyroid: No thyromegaly.  Cardiovascular:     Rate and Rhythm: Normal rate and regular rhythm.     Pulses: Normal pulses.     Heart sounds: Normal heart sounds.  Pulmonary:     Effort: Pulmonary effort is normal. No respiratory distress.     Breath sounds: Normal breath sounds.  Abdominal:     General: Bowel sounds are normal. There is no distension.     Palpations: Abdomen is soft.     Tenderness: There is no abdominal tenderness.  Musculoskeletal:        General: Normal range of motion.     Cervical back: Neck supple.     Right lower leg: No edema.     Left lower leg: No edema.  Lymphadenopathy:     Cervical: No cervical adenopathy.  Skin:    General: Skin is warm and dry.     Comments: Dialysis access chest wall  Neurological:     Mental Status: She is alert. Mental status is at baseline.  Psychiatric:        Mood and Affect: Mood normal.      ASSESSMENT/ PLAN:  TODAY  Aortic atherosclerosis/atherosclerotic peripheral vascular disease: (ct 10-04-18) ldl 89  2. Vascular dementia with behavioral disturbance: weight is 136 pounds  3. Diabetes mellitus type 2 with end stage renal disease (ESRD) hgb a1c 6.1; will stop lantus at this time and will monitor cbg's. Twice daily   4. Anemia due to end stage renal disease hgb 10.0; will monitor    PREVIOUS   5. Stage 1 infiltrating ductal carcinoma of female right breast/status post mastectomy on right. Will continue arimidex 1 mg daily   6. Multiple myeloma not having achieved remission: is followed by oncology   7. Herpes: without outbreak will continue acyclovir 200 mg daily   8. Major depression chronic recurrent: will continue zoloft 100 mg daily   9. Protein calorie malnutrition, severe: albumin 3.7  protein 6.7 will continue supplements as directed   10. Multiple gastric ulcers: duodenal ulcer; upper GI bleed; esophageal reflux disease without esophagitis: is stable will continue protonix 40 mg twice daily carafate 1 gm four times daily   11. History right hip fracture: will continue tylenol 650 mg three times daily  12. Chronic kidney disease with end stage renal disease on dialysis due to type 2 diabetes mellitus/dependence on dialysis: is on hemodialysis 3 days per week; 1200 cc fluid restirction; will continue calcium acetate; 1334 mg with breakfast and supper 667 mg  with lunch; and 667 mg with snacks.   61. Hypertension due to end stage renal disease (ESRD) due to type 2 diabetes mellitus b/p 134/62: will continue tenormin 25 mg daily clonidine 0.1 mg nightly   14. Hyperlipidemia associated with type 2 diabetes mellitus: LDL 83 will continue lipitor 10 mg daily    Ok Edwards NP [facility]   call [phone]

## 2021-08-27 DIAGNOSIS — N250 Renal osteodystrophy: Secondary | ICD-10-CM | POA: Diagnosis not present

## 2021-08-27 DIAGNOSIS — N186 End stage renal disease: Secondary | ICD-10-CM | POA: Diagnosis not present

## 2021-08-27 DIAGNOSIS — Z992 Dependence on renal dialysis: Secondary | ICD-10-CM | POA: Diagnosis not present

## 2021-08-27 DIAGNOSIS — N2581 Secondary hyperparathyroidism of renal origin: Secondary | ICD-10-CM | POA: Diagnosis not present

## 2021-08-27 DIAGNOSIS — E559 Vitamin D deficiency, unspecified: Secondary | ICD-10-CM | POA: Diagnosis not present

## 2021-08-29 DIAGNOSIS — Z992 Dependence on renal dialysis: Secondary | ICD-10-CM | POA: Diagnosis not present

## 2021-08-29 DIAGNOSIS — N250 Renal osteodystrophy: Secondary | ICD-10-CM | POA: Diagnosis not present

## 2021-08-29 DIAGNOSIS — N186 End stage renal disease: Secondary | ICD-10-CM | POA: Diagnosis not present

## 2021-08-29 DIAGNOSIS — N2581 Secondary hyperparathyroidism of renal origin: Secondary | ICD-10-CM | POA: Diagnosis not present

## 2021-08-29 DIAGNOSIS — E559 Vitamin D deficiency, unspecified: Secondary | ICD-10-CM | POA: Diagnosis not present

## 2021-09-01 DIAGNOSIS — N250 Renal osteodystrophy: Secondary | ICD-10-CM | POA: Diagnosis not present

## 2021-09-01 DIAGNOSIS — E559 Vitamin D deficiency, unspecified: Secondary | ICD-10-CM | POA: Diagnosis not present

## 2021-09-01 DIAGNOSIS — Z992 Dependence on renal dialysis: Secondary | ICD-10-CM | POA: Diagnosis not present

## 2021-09-01 DIAGNOSIS — N186 End stage renal disease: Secondary | ICD-10-CM | POA: Diagnosis not present

## 2021-09-01 DIAGNOSIS — N2581 Secondary hyperparathyroidism of renal origin: Secondary | ICD-10-CM | POA: Diagnosis not present

## 2021-09-02 ENCOUNTER — Inpatient Hospital Stay (HOSPITAL_COMMUNITY): Payer: Medicare PPO

## 2021-09-02 ENCOUNTER — Encounter (HOSPITAL_COMMUNITY): Payer: Self-pay

## 2021-09-02 VITALS — BP 107/52 | HR 70 | Temp 98.8°F | Resp 17

## 2021-09-02 DIAGNOSIS — Z79811 Long term (current) use of aromatase inhibitors: Secondary | ICD-10-CM | POA: Diagnosis not present

## 2021-09-02 DIAGNOSIS — C9000 Multiple myeloma not having achieved remission: Secondary | ICD-10-CM

## 2021-09-02 DIAGNOSIS — C50411 Malignant neoplasm of upper-outer quadrant of right female breast: Secondary | ICD-10-CM | POA: Diagnosis not present

## 2021-09-02 DIAGNOSIS — E1122 Type 2 diabetes mellitus with diabetic chronic kidney disease: Secondary | ICD-10-CM | POA: Diagnosis not present

## 2021-09-02 DIAGNOSIS — Z5111 Encounter for antineoplastic chemotherapy: Secondary | ICD-10-CM | POA: Diagnosis not present

## 2021-09-02 DIAGNOSIS — I120 Hypertensive chronic kidney disease with stage 5 chronic kidney disease or end stage renal disease: Secondary | ICD-10-CM | POA: Diagnosis not present

## 2021-09-02 DIAGNOSIS — M8580 Other specified disorders of bone density and structure, unspecified site: Secondary | ICD-10-CM | POA: Diagnosis not present

## 2021-09-02 DIAGNOSIS — Z170 Estrogen receptor positive status [ER+]: Secondary | ICD-10-CM | POA: Diagnosis not present

## 2021-09-02 LAB — COMPREHENSIVE METABOLIC PANEL
ALT: 12 U/L (ref 0–44)
AST: 17 U/L (ref 15–41)
Albumin: 3.4 g/dL — ABNORMAL LOW (ref 3.5–5.0)
Alkaline Phosphatase: 61 U/L (ref 38–126)
Anion gap: 15 (ref 5–15)
BUN: 35 mg/dL — ABNORMAL HIGH (ref 8–23)
CO2: 26 mmol/L (ref 22–32)
Calcium: 9.1 mg/dL (ref 8.9–10.3)
Chloride: 94 mmol/L — ABNORMAL LOW (ref 98–111)
Creatinine, Ser: 6.39 mg/dL — ABNORMAL HIGH (ref 0.44–1.00)
GFR, Estimated: 6 mL/min — ABNORMAL LOW (ref >60)
Glucose, Bld: 133 mg/dL — ABNORMAL HIGH (ref 70–99)
Potassium: 3.9 mmol/L (ref 3.5–5.1)
Sodium: 135 mmol/L (ref 135–145)
Total Bilirubin: 0.4 mg/dL (ref 0.3–1.2)
Total Protein: 6.5 g/dL (ref 6.5–8.1)

## 2021-09-02 LAB — CBC WITH DIFFERENTIAL/PLATELET
Abs Immature Granulocytes: 0.07 10*3/uL (ref 0.00–0.07)
Basophils Absolute: 0.1 10*3/uL (ref 0.0–0.1)
Basophils Relative: 1 %
Eosinophils Absolute: 0.2 10*3/uL (ref 0.0–0.5)
Eosinophils Relative: 2 %
HCT: 26.6 % — ABNORMAL LOW (ref 36.0–46.0)
Hemoglobin: 8.4 g/dL — ABNORMAL LOW (ref 12.0–15.0)
Immature Granulocytes: 1 %
Lymphocytes Relative: 11 %
Lymphs Abs: 1 10*3/uL (ref 0.7–4.0)
MCH: 32.2 pg (ref 26.0–34.0)
MCHC: 31.6 g/dL (ref 30.0–36.0)
MCV: 101.9 fL — ABNORMAL HIGH (ref 80.0–100.0)
Monocytes Absolute: 0.9 10*3/uL (ref 0.1–1.0)
Monocytes Relative: 9 %
Neutro Abs: 7.3 10*3/uL (ref 1.7–7.7)
Neutrophils Relative %: 76 %
Platelets: 257 10*3/uL (ref 150–400)
RBC: 2.61 MIL/uL — ABNORMAL LOW (ref 3.87–5.11)
RDW: 15.5 % (ref 11.5–15.5)
WBC: 9.5 10*3/uL (ref 4.0–10.5)
nRBC: 0 % (ref 0.0–0.2)

## 2021-09-02 MED ORDER — PROCHLORPERAZINE MALEATE 10 MG PO TABS
10.0000 mg | ORAL_TABLET | Freq: Once | ORAL | Status: AC
  Administered 2021-09-02: 10 mg via ORAL
  Filled 2021-09-02: qty 1

## 2021-09-02 MED ORDER — BORTEZOMIB CHEMO SQ INJECTION 3.5 MG (2.5MG/ML)
1.3000 mg/m2 | Freq: Once | INTRAMUSCULAR | Status: AC
  Administered 2021-09-02: 2 mg via SUBCUTANEOUS
  Filled 2021-09-02: qty 0.8

## 2021-09-02 MED ORDER — DEXAMETHASONE 4 MG PO TABS
10.0000 mg | ORAL_TABLET | Freq: Once | ORAL | Status: AC
  Administered 2021-09-02: 10 mg via ORAL
  Filled 2021-09-02: qty 3

## 2021-09-03 DIAGNOSIS — N250 Renal osteodystrophy: Secondary | ICD-10-CM | POA: Diagnosis not present

## 2021-09-03 DIAGNOSIS — N2581 Secondary hyperparathyroidism of renal origin: Secondary | ICD-10-CM | POA: Diagnosis not present

## 2021-09-03 DIAGNOSIS — N186 End stage renal disease: Secondary | ICD-10-CM | POA: Diagnosis not present

## 2021-09-03 DIAGNOSIS — E559 Vitamin D deficiency, unspecified: Secondary | ICD-10-CM | POA: Diagnosis not present

## 2021-09-03 DIAGNOSIS — Z992 Dependence on renal dialysis: Secondary | ICD-10-CM | POA: Diagnosis not present

## 2021-09-04 DIAGNOSIS — H25812 Combined forms of age-related cataract, left eye: Secondary | ICD-10-CM | POA: Diagnosis not present

## 2021-09-05 DIAGNOSIS — E559 Vitamin D deficiency, unspecified: Secondary | ICD-10-CM | POA: Diagnosis not present

## 2021-09-05 DIAGNOSIS — N2581 Secondary hyperparathyroidism of renal origin: Secondary | ICD-10-CM | POA: Diagnosis not present

## 2021-09-05 DIAGNOSIS — N186 End stage renal disease: Secondary | ICD-10-CM | POA: Diagnosis not present

## 2021-09-05 DIAGNOSIS — Z79899 Other long term (current) drug therapy: Secondary | ICD-10-CM | POA: Diagnosis not present

## 2021-09-05 DIAGNOSIS — Z992 Dependence on renal dialysis: Secondary | ICD-10-CM | POA: Diagnosis not present

## 2021-09-05 DIAGNOSIS — N250 Renal osteodystrophy: Secondary | ICD-10-CM | POA: Diagnosis not present

## 2021-09-08 DIAGNOSIS — Z992 Dependence on renal dialysis: Secondary | ICD-10-CM | POA: Diagnosis not present

## 2021-09-08 DIAGNOSIS — N2581 Secondary hyperparathyroidism of renal origin: Secondary | ICD-10-CM | POA: Diagnosis not present

## 2021-09-08 DIAGNOSIS — E559 Vitamin D deficiency, unspecified: Secondary | ICD-10-CM | POA: Diagnosis not present

## 2021-09-08 DIAGNOSIS — N186 End stage renal disease: Secondary | ICD-10-CM | POA: Diagnosis not present

## 2021-09-08 DIAGNOSIS — N250 Renal osteodystrophy: Secondary | ICD-10-CM | POA: Diagnosis not present

## 2021-09-10 DIAGNOSIS — N250 Renal osteodystrophy: Secondary | ICD-10-CM | POA: Diagnosis not present

## 2021-09-10 DIAGNOSIS — Z992 Dependence on renal dialysis: Secondary | ICD-10-CM | POA: Diagnosis not present

## 2021-09-10 DIAGNOSIS — N2581 Secondary hyperparathyroidism of renal origin: Secondary | ICD-10-CM | POA: Diagnosis not present

## 2021-09-10 DIAGNOSIS — E559 Vitamin D deficiency, unspecified: Secondary | ICD-10-CM | POA: Diagnosis not present

## 2021-09-10 DIAGNOSIS — N186 End stage renal disease: Secondary | ICD-10-CM | POA: Diagnosis not present

## 2021-09-12 DIAGNOSIS — N186 End stage renal disease: Secondary | ICD-10-CM | POA: Diagnosis not present

## 2021-09-12 DIAGNOSIS — Z992 Dependence on renal dialysis: Secondary | ICD-10-CM | POA: Diagnosis not present

## 2021-09-12 DIAGNOSIS — N2581 Secondary hyperparathyroidism of renal origin: Secondary | ICD-10-CM | POA: Diagnosis not present

## 2021-09-12 DIAGNOSIS — E559 Vitamin D deficiency, unspecified: Secondary | ICD-10-CM | POA: Diagnosis not present

## 2021-09-12 DIAGNOSIS — N250 Renal osteodystrophy: Secondary | ICD-10-CM | POA: Diagnosis not present

## 2021-09-15 DIAGNOSIS — E559 Vitamin D deficiency, unspecified: Secondary | ICD-10-CM | POA: Diagnosis not present

## 2021-09-15 DIAGNOSIS — M2041 Other hammer toe(s) (acquired), right foot: Secondary | ICD-10-CM | POA: Diagnosis not present

## 2021-09-15 DIAGNOSIS — N2581 Secondary hyperparathyroidism of renal origin: Secondary | ICD-10-CM | POA: Diagnosis not present

## 2021-09-15 DIAGNOSIS — N186 End stage renal disease: Secondary | ICD-10-CM | POA: Diagnosis not present

## 2021-09-15 DIAGNOSIS — E1140 Type 2 diabetes mellitus with diabetic neuropathy, unspecified: Secondary | ICD-10-CM | POA: Diagnosis not present

## 2021-09-15 DIAGNOSIS — N250 Renal osteodystrophy: Secondary | ICD-10-CM | POA: Diagnosis not present

## 2021-09-15 DIAGNOSIS — Z992 Dependence on renal dialysis: Secondary | ICD-10-CM | POA: Diagnosis not present

## 2021-09-15 DIAGNOSIS — Z794 Long term (current) use of insulin: Secondary | ICD-10-CM | POA: Diagnosis not present

## 2021-09-15 DIAGNOSIS — B351 Tinea unguium: Secondary | ICD-10-CM | POA: Diagnosis not present

## 2021-09-16 ENCOUNTER — Inpatient Hospital Stay (HOSPITAL_COMMUNITY): Payer: Medicare PPO | Attending: Hematology

## 2021-09-16 ENCOUNTER — Other Ambulatory Visit (HOSPITAL_COMMUNITY): Payer: Self-pay

## 2021-09-16 ENCOUNTER — Inpatient Hospital Stay (HOSPITAL_COMMUNITY): Payer: Medicare PPO

## 2021-09-16 VITALS — BP 103/46 | HR 73 | Temp 98.3°F | Resp 18

## 2021-09-16 DIAGNOSIS — C9000 Multiple myeloma not having achieved remission: Secondary | ICD-10-CM | POA: Diagnosis not present

## 2021-09-16 DIAGNOSIS — Z5112 Encounter for antineoplastic immunotherapy: Secondary | ICD-10-CM | POA: Insufficient documentation

## 2021-09-16 LAB — COMPREHENSIVE METABOLIC PANEL
ALT: 11 U/L (ref 0–44)
AST: 18 U/L (ref 15–41)
Albumin: 3.6 g/dL (ref 3.5–5.0)
Alkaline Phosphatase: 75 U/L (ref 38–126)
Anion gap: 14 (ref 5–15)
BUN: 45 mg/dL — ABNORMAL HIGH (ref 8–23)
CO2: 25 mmol/L (ref 22–32)
Calcium: 9.4 mg/dL (ref 8.9–10.3)
Chloride: 99 mmol/L (ref 98–111)
Creatinine, Ser: 7.79 mg/dL — ABNORMAL HIGH (ref 0.44–1.00)
GFR, Estimated: 5 mL/min — ABNORMAL LOW (ref >60)
Glucose, Bld: 210 mg/dL — ABNORMAL HIGH (ref 70–99)
Potassium: 3.7 mmol/L (ref 3.5–5.1)
Sodium: 138 mmol/L (ref 135–145)
Total Bilirubin: 0.2 mg/dL — ABNORMAL LOW (ref 0.3–1.2)
Total Protein: 6.9 g/dL (ref 6.5–8.1)

## 2021-09-16 LAB — CBC WITH DIFFERENTIAL/PLATELET
Abs Immature Granulocytes: 0.03 10*3/uL (ref 0.00–0.07)
Basophils Absolute: 0 10*3/uL (ref 0.0–0.1)
Basophils Relative: 0 %
Eosinophils Absolute: 0.2 10*3/uL (ref 0.0–0.5)
Eosinophils Relative: 2 %
HCT: 29.8 % — ABNORMAL LOW (ref 36.0–46.0)
Hemoglobin: 9.2 g/dL — ABNORMAL LOW (ref 12.0–15.0)
Immature Granulocytes: 0 %
Lymphocytes Relative: 12 %
Lymphs Abs: 1.2 10*3/uL (ref 0.7–4.0)
MCH: 31.7 pg (ref 26.0–34.0)
MCHC: 30.9 g/dL (ref 30.0–36.0)
MCV: 102.8 fL — ABNORMAL HIGH (ref 80.0–100.0)
Monocytes Absolute: 0.9 10*3/uL (ref 0.1–1.0)
Monocytes Relative: 9 %
Neutro Abs: 7.9 10*3/uL — ABNORMAL HIGH (ref 1.7–7.7)
Neutrophils Relative %: 77 %
Platelets: 267 10*3/uL (ref 150–400)
RBC: 2.9 MIL/uL — ABNORMAL LOW (ref 3.87–5.11)
RDW: 16.3 % — ABNORMAL HIGH (ref 11.5–15.5)
WBC: 10.3 10*3/uL (ref 4.0–10.5)
nRBC: 0 % (ref 0.0–0.2)

## 2021-09-16 LAB — MAGNESIUM: Magnesium: 2.2 mg/dL (ref 1.7–2.4)

## 2021-09-16 MED ORDER — DEXAMETHASONE 4 MG PO TABS
10.0000 mg | ORAL_TABLET | Freq: Once | ORAL | Status: AC
  Administered 2021-09-16: 10 mg via ORAL
  Filled 2021-09-16: qty 3

## 2021-09-16 MED ORDER — BORTEZOMIB CHEMO SQ INJECTION 3.5 MG (2.5MG/ML)
1.3000 mg/m2 | Freq: Once | INTRAMUSCULAR | Status: AC
  Administered 2021-09-16: 2 mg via SUBCUTANEOUS
  Filled 2021-09-16: qty 0.8

## 2021-09-16 MED ORDER — PROCHLORPERAZINE MALEATE 10 MG PO TABS
10.0000 mg | ORAL_TABLET | Freq: Once | ORAL | Status: AC
  Administered 2021-09-16: 10 mg via ORAL
  Filled 2021-09-16: qty 1

## 2021-09-16 NOTE — Progress Notes (Signed)
mag

## 2021-09-16 NOTE — Progress Notes (Signed)
Patient tolerated Velcade injection with no complaints voiced. Lab work reviewed. See MAR for details. Injection site clean and dry with no bruising or swelling noted. Patient stable during and after injection. Band aid applied. VSS. Patient left in satisfactory condition with no s/s of distress noted. 

## 2021-09-16 NOTE — Patient Instructions (Signed)
[facility]  Discharge Instructions: Thank you for choosing [facility] to provide your oncology and hematology care.  If you have a lab appointment with the [facility], please come in thru the Main Entrance and check in at the main information desk.  Wear comfortable clothing and clothing appropriate for easy access to any Portacath or PICC line.   We strive to give you quality time with your provider. You may need to reschedule your appointment if you arrive late (15 or more minutes).  Arriving late affects you and other patients whose appointments are after yours.  Also, if you miss three or more appointments without notifying the office, you may be dismissed from the clinic at the provider's discretion.      For prescription refill requests, have your pharmacy contact our office and allow 72 hours for refills to be completed.    Today you received the following chemotherapy and/or immunotherapy agents velcade.  Bortezomib injection What is this medication? BORTEZOMIB (bor TEZ oh mib) targets proteins in cancer cells and stops the cancer cells from growing. It treats multiple myeloma and mantle cell lymphoma. This medicine may be used for other purposes; ask your health care provider or pharmacist if you have questions. COMMON BRAND NAME(S): Velcade What should I tell my care team before I take this medication? They need to know if you have any of these conditions: dehydration diabetes (high blood sugar) heart disease liver disease tingling of the fingers or toes or other nerve disorder an unusual or allergic reaction to bortezomib, mannitol, boron, other medicines, foods, dyes, or preservatives pregnant or trying to get pregnant breast-feeding How should I use this medication? This medicine is injected into a vein or under the skin. It is given by a health care provider in a hospital or clinic setting. Talk to your health care provider about the use of  this medicine in children. Special care may be needed. Overdosage: If you think you have taken too much of this medicine contact a poison control center or emergency room at once. NOTE: This medicine is only for you. Do not share this medicine with others. What if I miss a dose? Keep appointments for follow-up doses. It is important not to miss your dose. Call your health care provider if you are unable to keep an appointment. What may interact with this medication? This medicine may interact with the following medications: ketoconazole rifampin This list may not describe all possible interactions. Give your health care provider a list of all the medicines, herbs, non-prescription drugs, or dietary supplements you use. Also tell them if you smoke, drink alcohol, or use illegal drugs. Some items may interact with your medicine. What should I watch for while using this medication? Your condition will be monitored carefully while you are receiving this medicine. You may need blood work done while you are taking this medicine. You may get drowsy or dizzy. Do not drive, use machinery, or do anything that needs mental alertness until you know how this medicine affects you. Do not stand up or sit up quickly, especially if you are an older patient. This reduces the risk of dizzy or fainting spells This medicine may increase your risk of getting an infection. Call your health care provider for advice if you get a fever, chills, sore throat, or other symptoms of a cold or flu. Do not treat yourself. Try to avoid being around people who are sick. Check with your health care provider  if you have severe diarrhea, nausea, and vomiting, or if you sweat a lot. The loss of too much body fluid may make it dangerous for you to take this medicine. Do not become pregnant while taking this medicine or for 7 months after stopping it. Women should inform their health care provider if they wish to become pregnant or think  they might be pregnant. Men should not father a child while taking this medicine and for 4 months after stopping it. There is a potential for serious harm to an unborn child. Talk to your health care provider for more information. Do not breast-feed an infant while taking this medicine or for 2 months after stopping it. This medicine may make it more difficult to get pregnant or father a child. Talk to your health care provider if you are concerned about your fertility. What side effects may I notice from receiving this medication? Side effects that you should report to your doctor or health care professional as soon as possible: allergic reactions (skin rash; itching or hives; swelling of the face, lips, or tongue) bleeding (bloody or black, tarry stools; red or dark brown urine; spitting up blood or brown material that looks like coffee grounds; red spots on the skin; unusual bruising or bleeding from the eye, gums, or nose) blurred vision or changes in vision confusion constipation headache heart failure (trouble breathing; fast, irregular heartbeat; sudden weight gain; swelling of the ankles, feet, hands) infection (fever, chills, cough, sore throat, pain or trouble passing urine) lack or loss of appetite liver injury (dark yellow or brown urine; general ill feeling or flu-like symptoms; loss of appetite, right upper belly pain; yellowing of the eyes or skin) low blood pressure (dizziness; feeling faint or lightheaded, falls; unusually weak or tired) muscle cramps pain, redness, or irritation at site where injected pain, tingling, numbness in the hands or feet seizures trouble breathing unusual bruising or bleeding Side effects that usually do not require medical attention (report to your doctor or health care professional if they continue or are bothersome): diarrhea nausea stomach pain trouble sleeping vomiting This list may not describe all possible side effects. Call your doctor  for medical advice about side effects. You may report side effects to FDA at 1-800-FDA-1088. Where should I keep my medication? This medicine is given in a hospital or clinic. It will not be stored at home. NOTE: This sheet is a summary. It may not cover all possible information. If you have questions about this medicine, talk to your doctor, pharmacist, or health care provider.  2023 Elsevier/Gold Standard (2020-02-15 00:00:00)       To help prevent nausea and vomiting after your treatment, we encourage you to take your nausea medication as directed.  BELOW ARE SYMPTOMS THAT SHOULD BE REPORTED IMMEDIATELY: *FEVER GREATER THAN 100.4 F (38 C) OR HIGHER *CHILLS OR SWEATING *NAUSEA AND VOMITING THAT IS NOT CONTROLLED WITH YOUR NAUSEA MEDICATION *UNUSUAL SHORTNESS OF BREATH *UNUSUAL BRUISING OR BLEEDING *URINARY PROBLEMS (pain or burning when urinating, or frequent urination) *BOWEL PROBLEMS (unusual diarrhea, constipation, pain near the anus) TENDERNESS IN MOUTH AND THROAT WITH OR WITHOUT PRESENCE OF ULCERS (sore throat, sores in mouth, or a toothache) UNUSUAL RASH, SWELLING OR PAIN  UNUSUAL VAGINAL DISCHARGE OR ITCHING   Items with * indicate a potential emergency and should be followed up as soon as possible or go to the Emergency Department if any problems should occur.  Please show the CHEMOTHERAPY ALERT CARD or IMMUNOTHERAPY ALERT CARD at check-in  to the Emergency Department and triage nurse.  Should you have questions after your visit or need to cancel or reschedule your appointment, please contact [facility] [phone]  and follow the prompts.  Office hours are 8:00 a.m. to 4:30 p.m. Monday - Friday. Please note that voicemails left after 4:00 p.m. may not be returned until the following business day.  We are closed weekends and major holidays. You have access to a nurse at all times for urgent questions. Please call the main number to the clinic [phone] and follow  the prompts.  For any non-urgent questions, you may also contact your provider using MyChart. We now offer e-Visits for anyone 88 and older to request care online for non-urgent symptoms. For details visit [email or web address].   Also download the MyChart app! Go to the app store, search "MyChart", open the app, select [facility], and log in with your MyChart username and password.  Masks are optional in the cancer centers. If you would like for your care team to wear a mask while they are taking care of you, please let them know. For doctor visits, patients may have with them one support person who is at least 84 years old. At this time, visitors are not allowed in the infusion area.

## 2021-09-17 DIAGNOSIS — N250 Renal osteodystrophy: Secondary | ICD-10-CM | POA: Diagnosis not present

## 2021-09-17 DIAGNOSIS — N2581 Secondary hyperparathyroidism of renal origin: Secondary | ICD-10-CM | POA: Diagnosis not present

## 2021-09-17 DIAGNOSIS — E559 Vitamin D deficiency, unspecified: Secondary | ICD-10-CM | POA: Diagnosis not present

## 2021-09-17 DIAGNOSIS — Z992 Dependence on renal dialysis: Secondary | ICD-10-CM | POA: Diagnosis not present

## 2021-09-17 DIAGNOSIS — N186 End stage renal disease: Secondary | ICD-10-CM | POA: Diagnosis not present

## 2021-09-19 DIAGNOSIS — Z992 Dependence on renal dialysis: Secondary | ICD-10-CM | POA: Diagnosis not present

## 2021-09-19 DIAGNOSIS — N2581 Secondary hyperparathyroidism of renal origin: Secondary | ICD-10-CM | POA: Diagnosis not present

## 2021-09-19 DIAGNOSIS — E559 Vitamin D deficiency, unspecified: Secondary | ICD-10-CM | POA: Diagnosis not present

## 2021-09-19 DIAGNOSIS — N186 End stage renal disease: Secondary | ICD-10-CM | POA: Diagnosis not present

## 2021-09-19 DIAGNOSIS — N250 Renal osteodystrophy: Secondary | ICD-10-CM | POA: Diagnosis not present

## 2021-09-22 DIAGNOSIS — N186 End stage renal disease: Secondary | ICD-10-CM | POA: Diagnosis not present

## 2021-09-22 DIAGNOSIS — N2581 Secondary hyperparathyroidism of renal origin: Secondary | ICD-10-CM | POA: Diagnosis not present

## 2021-09-22 DIAGNOSIS — N250 Renal osteodystrophy: Secondary | ICD-10-CM | POA: Diagnosis not present

## 2021-09-22 DIAGNOSIS — Z992 Dependence on renal dialysis: Secondary | ICD-10-CM | POA: Diagnosis not present

## 2021-09-22 DIAGNOSIS — E559 Vitamin D deficiency, unspecified: Secondary | ICD-10-CM | POA: Diagnosis not present

## 2021-09-24 DIAGNOSIS — N250 Renal osteodystrophy: Secondary | ICD-10-CM | POA: Diagnosis not present

## 2021-09-24 DIAGNOSIS — R1312 Dysphagia, oropharyngeal phase: Secondary | ICD-10-CM | POA: Diagnosis not present

## 2021-09-24 DIAGNOSIS — N2581 Secondary hyperparathyroidism of renal origin: Secondary | ICD-10-CM | POA: Diagnosis not present

## 2021-09-24 DIAGNOSIS — I120 Hypertensive chronic kidney disease with stage 5 chronic kidney disease or end stage renal disease: Secondary | ICD-10-CM | POA: Diagnosis not present

## 2021-09-24 DIAGNOSIS — E559 Vitamin D deficiency, unspecified: Secondary | ICD-10-CM | POA: Diagnosis not present

## 2021-09-24 DIAGNOSIS — Z992 Dependence on renal dialysis: Secondary | ICD-10-CM | POA: Diagnosis not present

## 2021-09-24 DIAGNOSIS — N186 End stage renal disease: Secondary | ICD-10-CM | POA: Diagnosis not present

## 2021-09-26 DIAGNOSIS — Z992 Dependence on renal dialysis: Secondary | ICD-10-CM | POA: Diagnosis not present

## 2021-09-26 DIAGNOSIS — N186 End stage renal disease: Secondary | ICD-10-CM | POA: Diagnosis not present

## 2021-09-26 DIAGNOSIS — E559 Vitamin D deficiency, unspecified: Secondary | ICD-10-CM | POA: Diagnosis not present

## 2021-09-26 DIAGNOSIS — N250 Renal osteodystrophy: Secondary | ICD-10-CM | POA: Diagnosis not present

## 2021-09-26 DIAGNOSIS — N2581 Secondary hyperparathyroidism of renal origin: Secondary | ICD-10-CM | POA: Diagnosis not present

## 2021-09-29 ENCOUNTER — Other Ambulatory Visit: Payer: Self-pay

## 2021-09-29 DIAGNOSIS — E559 Vitamin D deficiency, unspecified: Secondary | ICD-10-CM | POA: Diagnosis not present

## 2021-09-29 DIAGNOSIS — N2581 Secondary hyperparathyroidism of renal origin: Secondary | ICD-10-CM | POA: Diagnosis not present

## 2021-09-29 DIAGNOSIS — N250 Renal osteodystrophy: Secondary | ICD-10-CM | POA: Diagnosis not present

## 2021-09-29 DIAGNOSIS — N186 End stage renal disease: Secondary | ICD-10-CM | POA: Diagnosis not present

## 2021-09-29 DIAGNOSIS — Z992 Dependence on renal dialysis: Secondary | ICD-10-CM | POA: Diagnosis not present

## 2021-09-30 ENCOUNTER — Encounter (HOSPITAL_COMMUNITY): Payer: Self-pay

## 2021-09-30 ENCOUNTER — Inpatient Hospital Stay (HOSPITAL_COMMUNITY): Payer: Medicare PPO

## 2021-09-30 ENCOUNTER — Non-Acute Institutional Stay (SKILLED_NURSING_FACILITY): Payer: Medicare PPO | Admitting: Adult Health

## 2021-09-30 ENCOUNTER — Other Ambulatory Visit: Payer: Self-pay

## 2021-09-30 ENCOUNTER — Encounter: Payer: Self-pay | Admitting: Adult Health

## 2021-09-30 VITALS — BP 121/58 | HR 77 | Temp 98.6°F | Resp 18 | Wt 134.7 lb

## 2021-09-30 DIAGNOSIS — C50911 Malignant neoplasm of unspecified site of right female breast: Secondary | ICD-10-CM

## 2021-09-30 DIAGNOSIS — C9000 Multiple myeloma not having achieved remission: Secondary | ICD-10-CM | POA: Diagnosis not present

## 2021-09-30 DIAGNOSIS — Z5112 Encounter for antineoplastic immunotherapy: Secondary | ICD-10-CM | POA: Diagnosis not present

## 2021-09-30 DIAGNOSIS — B009 Herpesviral infection, unspecified: Secondary | ICD-10-CM | POA: Diagnosis not present

## 2021-09-30 DIAGNOSIS — F339 Major depressive disorder, recurrent, unspecified: Secondary | ICD-10-CM

## 2021-09-30 LAB — CBC WITH DIFFERENTIAL/PLATELET
Abs Immature Granulocytes: 0.02 10*3/uL (ref 0.00–0.07)
Basophils Absolute: 0 10*3/uL (ref 0.0–0.1)
Basophils Relative: 1 %
Eosinophils Absolute: 0.2 10*3/uL (ref 0.0–0.5)
Eosinophils Relative: 2 %
HCT: 31.1 % — ABNORMAL LOW (ref 36.0–46.0)
Hemoglobin: 9.6 g/dL — ABNORMAL LOW (ref 12.0–15.0)
Immature Granulocytes: 0 %
Lymphocytes Relative: 13 %
Lymphs Abs: 0.9 10*3/uL (ref 0.7–4.0)
MCH: 31.3 pg (ref 26.0–34.0)
MCHC: 30.9 g/dL (ref 30.0–36.0)
MCV: 101.3 fL — ABNORMAL HIGH (ref 80.0–100.0)
Monocytes Absolute: 0.5 10*3/uL (ref 0.1–1.0)
Monocytes Relative: 8 %
Neutro Abs: 4.9 10*3/uL (ref 1.7–7.7)
Neutrophils Relative %: 76 %
Platelets: 232 10*3/uL (ref 150–400)
RBC: 3.07 MIL/uL — ABNORMAL LOW (ref 3.87–5.11)
RDW: 16.4 % — ABNORMAL HIGH (ref 11.5–15.5)
WBC: 6.6 10*3/uL (ref 4.0–10.5)
nRBC: 0 % (ref 0.0–0.2)

## 2021-09-30 LAB — COMPREHENSIVE METABOLIC PANEL
ALT: 12 U/L (ref 0–44)
AST: 18 U/L (ref 15–41)
Albumin: 3.7 g/dL (ref 3.5–5.0)
Alkaline Phosphatase: 69 U/L (ref 38–126)
Anion gap: 13 (ref 5–15)
BUN: 40 mg/dL — ABNORMAL HIGH (ref 8–23)
CO2: 27 mmol/L (ref 22–32)
Calcium: 9.2 mg/dL (ref 8.9–10.3)
Chloride: 96 mmol/L — ABNORMAL LOW (ref 98–111)
Creatinine, Ser: 6.87 mg/dL — ABNORMAL HIGH (ref 0.44–1.00)
GFR, Estimated: 6 mL/min — ABNORMAL LOW (ref >60)
Glucose, Bld: 222 mg/dL — ABNORMAL HIGH (ref 70–99)
Potassium: 4 mmol/L (ref 3.5–5.1)
Sodium: 136 mmol/L (ref 135–145)
Total Bilirubin: 0.5 mg/dL (ref 0.3–1.2)
Total Protein: 6.8 g/dL (ref 6.5–8.1)

## 2021-09-30 MED ORDER — BORTEZOMIB CHEMO SQ INJECTION 3.5 MG (2.5MG/ML)
1.3000 mg/m2 | Freq: Once | INTRAMUSCULAR | Status: AC
  Administered 2021-09-30: 2 mg via SUBCUTANEOUS
  Filled 2021-09-30: qty 0.8

## 2021-09-30 MED ORDER — DEXAMETHASONE 4 MG PO TABS
10.0000 mg | ORAL_TABLET | Freq: Once | ORAL | Status: AC
  Administered 2021-09-30: 10 mg via ORAL
  Filled 2021-09-30: qty 3

## 2021-09-30 MED ORDER — PROCHLORPERAZINE MALEATE 10 MG PO TABS
10.0000 mg | ORAL_TABLET | Freq: Once | ORAL | Status: AC
  Administered 2021-09-30: 10 mg via ORAL
  Filled 2021-09-30: qty 1

## 2021-09-30 NOTE — Progress Notes (Signed)
Patient presents today for Velcade injection. Vital signs within parameters for treatment. Labs within parameters for treatment. Ignore creatinine on patient per treatment condition guidelines. Patient has CKD and on dialysis. Patient denies any significant changes since her last treatment.   Treatment given today per MD orders. Tolerated infusion without adverse affects. Vital signs stable. No complaints at this time. Discharged from clinic by wheel chair in stable condition. Alert and oriented x 3. F/U with [facility] as scheduled.

## 2021-09-30 NOTE — Patient Instructions (Signed)
[facility]  Discharge Instructions: Thank you for choosing [facility] to provide your oncology and hematology care.  If you have a lab appointment with the [facility], please come in thru the Main Entrance and check in at the main information desk.  Wear comfortable clothing and clothing appropriate for easy access to any Portacath or PICC line.   We strive to give you quality time with your provider. You may need to reschedule your appointment if you arrive late (15 or more minutes).  Arriving late affects you and other patients whose appointments are after yours.  Also, if you miss three or more appointments without notifying the office, you may be dismissed from the clinic at the provider's discretion.      For prescription refill requests, have your pharmacy contact our office and allow 72 hours for refills to be completed.    Today you received the following chemotherapy and/or immunotherapy agents Velcade injection.       To help prevent nausea and vomiting after your treatment, we encourage you to take your nausea medication as directed.  BELOW ARE SYMPTOMS THAT SHOULD BE REPORTED IMMEDIATELY: *FEVER GREATER THAN 100.4 F (38 C) OR HIGHER *CHILLS OR SWEATING *NAUSEA AND VOMITING THAT IS NOT CONTROLLED WITH YOUR NAUSEA MEDICATION *UNUSUAL SHORTNESS OF BREATH *UNUSUAL BRUISING OR BLEEDING *URINARY PROBLEMS (pain or burning when urinating, or frequent urination) *BOWEL PROBLEMS (unusual diarrhea, constipation, pain near the anus) TENDERNESS IN MOUTH AND THROAT WITH OR WITHOUT PRESENCE OF ULCERS (sore throat, sores in mouth, or a toothache) UNUSUAL RASH, SWELLING OR PAIN  UNUSUAL VAGINAL DISCHARGE OR ITCHING   Items with * indicate a potential emergency and should be followed up as soon as possible or go to the Emergency Department if any problems should occur.  Please show the CHEMOTHERAPY ALERT CARD or IMMUNOTHERAPY ALERT CARD at check-in to the  Emergency Department and triage nurse.  Should you have questions after your visit or need to cancel or reschedule your appointment, please contact [facility] [phone]  and follow the prompts.  Office hours are 8:00 a.m. to 4:30 p.m. Monday - Friday. Please note that voicemails left after 4:00 p.m. may not be returned until the following business day.  We are closed weekends and major holidays. You have access to a nurse at all times for urgent questions. Please call the main number to the clinic [phone] and follow the prompts.  For any non-urgent questions, you may also contact your provider using MyChart. We now offer e-Visits for anyone 23 and older to request care online for non-urgent symptoms. For details visit [email or web address].   Also download the MyChart app! Go to the app store, search "MyChart", open the app, select [facility], and log in with your MyChart username and password.  Masks are optional in the cancer centers. If you would like for your care team to wear a mask while they are taking care of you, please let them know. For doctor visits, patients may have with them one support person who is at least 84 years old. At this time, visitors are not allowed in the infusion area.

## 2021-09-30 NOTE — Progress Notes (Signed)
Location:  [facility] Room Number: 148-D Place of Service:  SNF (31)   CODE STATUS: DNR  Allergies  Allergen Reactions   Ace Inhibitors Other (See Comments) and Cough    Tongue swell , ie angioedema   Angiotensin Receptor Blockers     Angioedema with ACE-I   Penicillins Other (See Comments)    Unsure of reaction Has patient had a PCN reaction causing immediate rash, facial/tongue/throat swelling, SOB or lightheadedness with hypotension: Unknown Has patient had a PCN reaction causing severe rash involving mucus membranes or skin necrosis: Unknown Has patient had a PCN reaction that required hospitalization: No Has patient had a PCN reaction occurring within the last 10 years: Unknown If all of the above answers are "NO", then may proceed with Cephalosporin use.     Penicillin G     Chief Complaint  Patient presents with   Medical Management of Chronic Issues                                                    Stage 1 infiltrating ductal carcinoma of female right breast/  Multiple myeloma not having achieved remission:   Herpes:    Major depression chronic recurrent;    HPI:  She is a 86 year old long term resident of this facility being seen for the management of her chronic illnesses:  Stage 1 infiltrating ductal carcinoma of female right breast/  Multiple myeloma not having achieved remission:   Herpes:    Major depression chronic recurrent. There are no reports of uncontrolled pain. She continues to go to dialysis three days weekly. There are no reports of anxiety.   Past Medical History:  Diagnosis Date   Anemia     chronic macrocytic anemia   Anxiety    Chronic kidney disease    Chronic renal disease, stage 4, severely decreased glomerular filtration rate (GFR) between 15-29 mL/min/1.73 square meter ([place]) 09/32/6712   Complication of anesthesia    delirious after Breast Surgery   Dementia ([place])    mild   Depression    Diabetes mellitus with  ESRD (end-stage renal disease) ([place])    type II   Dysphagia    Dyspnea    with activity   GERD (gastroesophageal reflux disease)    Glaucoma    Hyperlipidemia    Hypertension    Pneumonia    Stage 1 infiltrating ductal carcinoma of right female breast ([place]) 08/21/2015   ER+ PR+ HER 2 neu + (3+) T1cN0     Past Surgical History:  Procedure Laterality Date   A/V FISTULAGRAM Right 07/05/2020   Procedure: A/V Fistulagram;  Surgeon: Elam Dutch, MD;  Location: [facility];  Service: Cardiovascular;  Laterality: Right;   AV FISTULA PLACEMENT Left 11/22/2017   Procedure: ARTERIOVENOUS (AV) FISTULA CREATION LEFT ARM;  Surgeon: Elam Dutch, MD;  Location: [facility] OR;  Service: Vascular;  Laterality: Left;   AV FISTULA PLACEMENT Right 04/04/2020   Procedure: RIGHT ARM ARTERIOVENOUS FISTULA CREATION;  Surgeon: Rosetta Posner, MD;  Location: [facility];  Service: Vascular;  Laterality: Right;   AV FISTULA PLACEMENT Right 08/20/2020   Procedure: ARTERIOVENOUS (AV) FISTULA LIGATION RIGHT ARM;  Surgeon: Elam Dutch, MD;  Location: [facility];  Service: Vascular;  Laterality: Right;   BIOPSY  08/07/2016  Procedure: BIOPSY;  Surgeon: Daneil Dolin, MD;  [facility];  Service: Endoscopy;;  gastric ulcer biopsy   COLONOSCOPY     ESOPHAGOGASTRODUODENOSCOPY N/A 08/07/2016   LA Grade A esophagitis s/p dilation, small hiatal hernia, multiple gastric ulcers and erosions, duodenal erosions s/p biopsy. Negative H.pylori    ESOPHAGOGASTRODUODENOSCOPY N/A 11/27/2016   normal esophagus, previously noted gastric ulcers completely healed, normal duodenum.    ESOPHAGOGASTRODUODENOSCOPY (EGD) WITH PROPOFOL N/A 07/23/2020   Procedure: ESOPHAGOGASTRODUODENOSCOPY (EGD) WITH PROPOFOL;  Surgeon: Rogene Houston, MD;  [facility];  Service: Endoscopy;  Laterality: N/A;   FISTULA SUPERFICIALIZATION Left 02/14/2018   Procedure: FISTULA SUPERFICIALIZATION LEFT ARM;  Surgeon: Angelia Mould,  MD;  Location: [facility] OR;  Service: Vascular;  Laterality: Left;   FRACTURE SURGERY Right    ankle   HOT HEMOSTASIS  07/23/2020   Procedure: HOT HEMOSTASIS (ARGON PLASMA COAGULATION/BICAP);  Surgeon: Rogene Houston, MD;  [facility];  Service: Endoscopy;;   INTRAMEDULLARY (IM) NAIL INTERTROCHANTERIC Right 07/12/2020   Procedure: INTRAMEDULLARY (IM) NAIL INTERTROCHANTRIC;  Surgeon: Mordecai Rasmussen, MD;  [facility];  Service: Orthopedics;  Laterality: Right;   MALONEY DILATION N/A 08/07/2016   Procedure: Venia Minks DILATION;  Surgeon: Daneil Dolin, MD;  [facility];  Service: Endoscopy;  Laterality: N/A;   MASTECTOMY, PARTIAL Right    multiple myeloma     PERIPHERAL VASCULAR BALLOON ANGIOPLASTY Left 07/13/2019   Procedure: PERIPHERAL VASCULAR BALLOON ANGIOPLASTY;  Surgeon: Marty Heck, MD;  Location: [facility];  Service: Cardiovascular;  Laterality: Left;  arm fistulogram   PERIPHERAL VASCULAR BALLOON ANGIOPLASTY Right 05/22/2020   Procedure: PERIPHERAL VASCULAR BALLOON ANGIOPLASTY;  Surgeon: Cherre Robins, MD;  Location: [facility];  Service: Cardiovascular;  Laterality: Right;  arm fistula   PERIPHERAL VASCULAR BALLOON ANGIOPLASTY Right 07/05/2020   Procedure: PERIPHERAL VASCULAR BALLOON ANGIOPLASTY;  Surgeon: Elam Dutch, MD;  Location: [facility] [facility];  Service: Cardiovascular;  Laterality: Right;  arm fistula   PORT-A-CATH REMOVAL Left 11/22/2017   Procedure: REMOVAL PORT-A-CATH LEFT CHEST;  Surgeon: Elam Dutch, MD;  Location: [facility];  Service: Vascular;  Laterality: Left;   RETINAL DETACHMENT SURGERY Right    SCLEROTHERAPY  07/23/2020   Procedure: SCLEROTHERAPY;  Surgeon: Rogene Houston, MD;  [facility];  Service: Endoscopy;;   UPPER EXTREMITY VENOGRAPHY N/A 08/22/2020   Procedure: LEFT UPPER & CENTRAL VENOGRAPHY;  Surgeon: Marty Heck, MD;  Location: [facility];  Service: Cardiovascular;   Laterality: N/A;    Social History   Socioeconomic History   Marital status: Single    Spouse name: Not on file   Number of children: Not on file   Years of education: Not on file   Highest education level: Not on file  Occupational History   Occupation: retired   Tobacco Use   Smoking status: Never   Smokeless tobacco: Never  Vaping Use   Vaping Use: Never used  Substance and Sexual Activity   Alcohol use: No    Alcohol/week: 0.0 standard drinks of alcohol   Drug use: No   Sexual activity: Never  Other Topics Concern   Not on file  Social History Narrative   Long term resident of SNF    Social Determinants of Health   Financial Resource Strain: [place]  (01/09/2020)   Overall Financial Resource Strain (CARDIA)    Difficulty of Paying Living Expenses: Not very  hard  Food Insecurity: No Food Insecurity (01/09/2020)   Hunger Vital Sign    Worried About Running Out of Food in the Last Year: Never true    Ran Out of Food in the Last Year: Never true  Transportation Needs: No Transportation Needs (01/09/2020)   PRAPARE - Hydrologist (Medical): No    Lack of Transportation (Non-Medical): No  Physical Activity: Inactive (01/09/2020)   Exercise Vital Sign    Days of Exercise per Week: 0 days    Minutes of Exercise per Session: 0 min  Stress: No Stress Concern Present (01/09/2020)   [place]    Feeling of Stress : Not at all  Social Connections: Moderately Isolated (01/09/2020)   Social Connection and Isolation Panel [NHANES]    Frequency of Communication with Friends and Family: More than three times a week    Frequency of Social Gatherings with Friends and Family: Once a week    Attends Religious Services: More than 4 times per year    Active Member of [employer] or Organizations: No    Attends Archivist Meetings: Never    Marital Status: Never married  Intimate Partner  Violence: Not At Risk (01/09/2020)   Humiliation, Afraid, Rape, and Kick questionnaire    Fear of Current or Ex-Partner: No    Emotionally Abused: No    Physically Abused: No    Sexually Abused: No   Family History  Problem Relation Age of Onset   Multiple myeloma Sister    Brain cancer Sister    Dementia Mother        died at 60   Stroke Mother    Heart failure Mother    Diabetes Mother    Heart disease Father    Prostate cancer Brother    Colon cancer Neg Hx       VITAL SIGNS BP 135/66   Pulse 71   Temp 97.8 F (36.6 C)   Resp 18   Ht _0  (1.6 m)   Wt 142 lb 3.2 oz (64.5 kg)   BMI 25.19 kg/m   Facility-Administered Encounter Medications as of 09/30/2021  Medication   lanreotide acetate (SOMATULINE DEPOT) 120 MG/0.5ML injection   lanreotide acetate (SOMATULINE DEPOT) 120 MG/0.5ML injection   octreotide (SANDOSTATIN LAR) 30 MG IM injection   octreotide (SANDOSTATIN LAR) 30 MG IM injection   Outpatient Encounter Medications as of 09/30/2021  Medication Sig   acetaminophen (TYLENOL) 325 MG tablet Take 650 mg by mouth every 8 (eight) hours.   acyclovir (ZOVIRAX) 200 MG capsule Take 200 mg by mouth in the morning. (0800)   Amino Acids-Protein Hydrolys (FEEDING SUPPLEMENT, PRO-STAT SUGAR FREE 64,) LIQD Take 30 mLs by mouth 3 (three) times daily with meals. (0800, 1200 & 1800)   anastrozole (ARIMIDEX) 1 MG tablet TAKE 1 TABLET BY MOUTH DAILY   atenolol (TENORMIN) 25 MG tablet Take 1 tablet (25 mg total) by mouth in the morning. (0800)   atorvastatin (LIPITOR) 10 MG tablet Take 10 mg by mouth at bedtime. (2000)   Calcium Acetate 667 MG TABS Take 2 tablets by mouth 3 (three) times daily with meals. 1 tablet daily w/ snack   calcium-vitamin D (OSCAL WITH D) 500-200 MG-UNIT tablet Take 1 tablet by mouth in the morning. (0800)   cloNIDine (CATAPRES) 0.1 MG tablet Take 1 tablet (0.1 mg total) by mouth at bedtime. (2000) Hold for SBP < 110 bpm  docusate sodium (COLACE) 100 MG  capsule Take 100 mg by mouth 2 (two) times daily.   Insulin Pen Needle (BD AUTOSHIELD DUO) 30G X 5 MM MISC by Does not apply route. 3/16"   melatonin 3 MG TABS tablet Take 6 mg by mouth at bedtime. (2000) For Sleep   multivitamin (RENA-VIT) TABS tablet Take 1 tablet by mouth at bedtime. (2100)   NON FORMULARY Diet-Regular with thin liquids   ondansetron (ZOFRAN) 4 MG tablet Take 1 tablet (4 mg total) by mouth every 6 (six) hours as needed for nausea.   pantoprazole (PROTONIX) 40 MG tablet Take 40 mg by mouth 2 (two) times daily.   sertraline (ZOLOFT) 25 MG tablet Take 25 mg by mouth daily. At 9 am along with 50 mg tablet for a total of 75 mg daily   sertraline (ZOLOFT) 50 MG tablet Take 50 mg by mouth daily. At 9 am along with 25 mg tablet for a total of 75 mg daily   sucralfate (CARAFATE) 1 GM/10ML suspension Take 1 g by mouth in the morning, at noon, in the evening, and at bedtime. (0800, 1200, 1700 & 2100)   [DISCONTINUED] Balsam [place]-Castor Oil (VENELEX) OINT Apply topically. Apply to bilateral buttocks and sacrum every shift.   [DISCONTINUED] insulin glargine (LANTUS SOLOSTAR) 100 UNIT/ML Solostar Pen Inject 5 Units into the skin at bedtime. (2000)     SIGNIFICANT DIAGNOSTIC EXAMS  NO NEW EXAMS.   LABS REVIEWED PREVIOUS   09-30-20: wbc 5.6; hgb 13.1; hct 42.6; mcv 97.9 plt 260; glucose 92; bun 23; creat 4.18; k+ 3.1; na++ 134; ca 8.7; GFR 10; liver normal albumin 3.8 10-29-20: wbc 5.3; hgb 12.1; hct 40.0; mcv 96.6 plt 218; glucose 190; bun 54; creat 7.35 k+ 4.0; na++ 134; ca 9.4; GFR 5 liver normal albumin 3.7; mag 2.3 11-19-20: wbc 10.9; hgb 11.3; hct 37.2; mcv 96.6 plt 242; glucose 106; bun 47; creat 6.58; k+ 3.9; na++ 133; ca 9.0; GFR 6 liver normal albumin 3.5  12-02-20: hgb a1c 5.6; chol 167; ldl 83; trig 229; hdl 38 12-03-20; wbc 5.2; hgb 11.1; hct 35.7; mcv 96.5 plt 320; glucose 139; bun 47; creat 6.05; k+ 4.1; na++ 134; ca 9.1; GFR 6; liver normal albumin 3.8 mag 2.4  12-17-20: wbc  5.2; hgb 10.5; hct 33.8; mcv 98.8 plt 256; glucose 136; bun 43; creat 6.19; k+ 3.6; na++ 137; ca 8.9; GFR 6 liver normal albumin 3.9  03-06-21: wbc 4.7; hgb 12.3; hct 38.2; mcv 101.6 plt 221; glucose 205; bun 51; creat 7.06; k+ 4.5; na++ 133; ca 9.1; GFR 5; liver normal albumin 3.8  03-20-21: wbc 5.4; hgb 12.1; hct 37.7; mcv 97.2 plt 254; glucose 111; bun 47; creat 7.34; k+ 4.6; na++ 131; ca 9.6 GFR 5; liver normal albumin 3.9; mag 2.2 03-27-21: hgb a1c 6.1   04-29-21; wbc 5.4; hgb 11.0; hct 34.6;mcv 100.9 plt 179; glucose 82; bun 44; creat 7.04; k+ 4.6; na++ 139; ca 8.7; GFR 5 d-dimer: 1.25; CRP 4.0   05-27-21: wbc 6.0; hgb 10.6; hct 33.5; mcv 101.2 plt 261; glucose 147; bun 45; creat 6.86; k+ 4.5; na++ 138; ca 8.3; GFR 6; protein 6.9; albumin 3.8  06-12-21: wbc 6.5; hgb 8.8; hct 27.7; mcv 101.5 plt 229; glucose 153; bun 40; creat 6.53; k+ 4.3; na++ 136; ca 9.5; GFR 6 protein 6.5; albumin 3.4 mag 2.2  07-08-21: wbc 15.7; hgb 9.8; hct 32.8; mcv 104.8; plt 226; glucose 164; bun 51; creat 7.74; k+ 4.4; na++ 138; ca 9.7;  gfr 5 protein 7.5; albumin 3.9 mag 2.1; chol 171; ldl 89; trig 265; hdl 29; hgb a1c 6.1 08-12-21: wbc 6.3; hgb 10.0; hct 31.9; mcv 103.9 plt 215; glucose 197; bun 40;creat 6.25; k+ 4.2; na++138; ca 9.3; gfr 6; protein 6.7; albumin 3.7; iron 93; tibc 142; ferritin 312  TODAY  09-30-21: wbc 6.6; hgb 9.6; hct 31.1; mcv 101.3 plt 232; glucose 222; bun 40; creat 6.78; k+ 4.0; na++ 136; ca 9.2; gfr 6; protein 6.8 albumin 3.7   Review of Systems  Constitutional:  Negative for malaise/fatigue.  Respiratory:  Negative for cough and shortness of breath.   Cardiovascular:  Negative for chest pain, palpitations and leg swelling.  Gastrointestinal:  Negative for abdominal pain, constipation and heartburn.  Musculoskeletal:  Negative for back pain, joint pain and myalgias.  Skin: Negative.   Neurological:  Negative for dizziness.  Psychiatric/Behavioral:  The patient is not nervous/anxious.      Physical Exam Constitutional:      General: She is not in acute distress.    Appearance: She is well-developed. She is not diaphoretic.  Neck:     Thyroid: No thyromegaly.  Cardiovascular:     Rate and Rhythm: Normal rate and regular rhythm.     Pulses: Normal pulses.     Heart sounds: Normal heart sounds.  Pulmonary:     Effort: Pulmonary effort is normal. No respiratory distress.     Breath sounds: Normal breath sounds.  Abdominal:     General: Bowel sounds are normal. There is no distension.     Palpations: Abdomen is soft.     Tenderness: There is no abdominal tenderness.  Musculoskeletal:        General: Normal range of motion.     Cervical back: Neck supple.     Right lower leg: No edema.     Left lower leg: No edema.  Lymphadenopathy:     Cervical: No cervical adenopathy.  Skin:    General: Skin is warm and dry.     Comments: Dialysis access chest wall   Neurological:     Mental Status: She is alert. Mental status is at baseline.  Psychiatric:        Mood and Affect: Mood normal.       ASSESSMENT/ PLAN:  TODAY  Stage 1 infiltrating ductal carcinoma of female right breast/status post right mastectomy: will continue arimidex 1 mg daily   2. Multiple myeloma not having achieved remission: is followed by oncology  3. Herpes: without outbreak will continue acyclovir 200 mg daily   4. Major depression chronic recurrent; will continue zoloft 75 mg daily   PREVIOUS   5. Protein calorie malnutrition, severe: albumin 3.7 protein 6.8 will continue supplements as directed   6. Multiple gastric ulcers: duodenal ulcer; upper GI bleed; esophageal reflux disease without esophagitis: is stable will continue protonix 40 mg twice daily carafate 1 gm four times daily   7. History right hip fracture: will continue tylenol 650 mg three times daily  8. Chronic kidney disease with end stage renal disease on dialysis due to type 2 diabetes mellitus/dependence on dialysis: is  on hemodialysis 3 days per week; 1200 cc fluid restirction; will continue calcium acetate; 1334 mg with breakfast and supper 667 mg with lunch; and 667 mg with snacks.   9. Hypertension due to end stage renal disease (ESRD) due to type 2 diabetes mellitus b/p 135/66: will continue tenormin 25 mg daily clonidine 0.1 mg nightly   10. Hyperlipidemia associated with  type 2 diabetes mellitus: LDL 83 will continue lipitor 10 mg daily  11. Aortic atherosclerosis/atherosclerotic peripheral vascular disease: (ct 10-04-18) ldl 89  12. Vascular dementia with behavioral disturbance: weight is 142 pounds  13. Diabetes mellitus type 2 with end stage renal disease (ESRD) hgb a1c 6.1; is off lantus   14. Anemia due to end stage renal disease hgb 9.6; will monitor           Ok Edwards NP [facility]   call [phone]

## 2021-10-01 ENCOUNTER — Other Ambulatory Visit: Payer: Self-pay

## 2021-10-01 ENCOUNTER — Encounter (HOSPITAL_COMMUNITY): Payer: Self-pay

## 2021-10-01 ENCOUNTER — Emergency Department (HOSPITAL_COMMUNITY): Payer: Medicare PPO

## 2021-10-01 ENCOUNTER — Observation Stay (HOSPITAL_COMMUNITY)
Admission: EM | Admit: 2021-10-01 | Discharge: 2021-10-03 | Disposition: A | Discharge status: Skilled Nursing Facility | Payer: Medicare PPO | Attending: Family Medicine | Admitting: Family Medicine

## 2021-10-01 DIAGNOSIS — N186 End stage renal disease: Secondary | ICD-10-CM | POA: Diagnosis present

## 2021-10-01 DIAGNOSIS — N261 Atrophy of kidney (terminal): Secondary | ICD-10-CM | POA: Diagnosis not present

## 2021-10-01 DIAGNOSIS — F01518 Vascular dementia, unspecified severity, with other behavioral disturbance: Secondary | ICD-10-CM | POA: Diagnosis present

## 2021-10-01 DIAGNOSIS — R1110 Vomiting, unspecified: Secondary | ICD-10-CM | POA: Diagnosis not present

## 2021-10-01 DIAGNOSIS — R7401 Elevation of levels of liver transaminase levels: Secondary | ICD-10-CM | POA: Insufficient documentation

## 2021-10-01 DIAGNOSIS — F0390 Unspecified dementia without behavioral disturbance: Secondary | ICD-10-CM | POA: Diagnosis not present

## 2021-10-01 DIAGNOSIS — I120 Hypertensive chronic kidney disease with stage 5 chronic kidney disease or end stage renal disease: Secondary | ICD-10-CM

## 2021-10-01 DIAGNOSIS — R748 Abnormal levels of other serum enzymes: Secondary | ICD-10-CM

## 2021-10-01 DIAGNOSIS — Z95820 Peripheral vascular angioplasty status with implants and grafts: Secondary | ICD-10-CM | POA: Insufficient documentation

## 2021-10-01 DIAGNOSIS — N200 Calculus of kidney: Secondary | ICD-10-CM | POA: Diagnosis not present

## 2021-10-01 DIAGNOSIS — E1122 Type 2 diabetes mellitus with diabetic chronic kidney disease: Secondary | ICD-10-CM | POA: Diagnosis not present

## 2021-10-01 DIAGNOSIS — I700 Atherosclerosis of aorta: Secondary | ICD-10-CM | POA: Diagnosis not present

## 2021-10-01 DIAGNOSIS — K5730 Diverticulosis of large intestine without perforation or abscess without bleeding: Secondary | ICD-10-CM | POA: Diagnosis not present

## 2021-10-01 DIAGNOSIS — C9000 Multiple myeloma not having achieved remission: Secondary | ICD-10-CM | POA: Diagnosis present

## 2021-10-01 DIAGNOSIS — R55 Syncope and collapse: Principal | ICD-10-CM | POA: Diagnosis present

## 2021-10-01 DIAGNOSIS — C50911 Malignant neoplasm of unspecified site of right female breast: Secondary | ICD-10-CM | POA: Insufficient documentation

## 2021-10-01 DIAGNOSIS — Z992 Dependence on renal dialysis: Secondary | ICD-10-CM | POA: Insufficient documentation

## 2021-10-01 DIAGNOSIS — N281 Cyst of kidney, acquired: Secondary | ICD-10-CM | POA: Diagnosis not present

## 2021-10-01 DIAGNOSIS — R7989 Other specified abnormal findings of blood chemistry: Secondary | ICD-10-CM | POA: Diagnosis not present

## 2021-10-01 DIAGNOSIS — Z79899 Other long term (current) drug therapy: Secondary | ICD-10-CM | POA: Diagnosis not present

## 2021-10-01 DIAGNOSIS — K8020 Calculus of gallbladder without cholecystitis without obstruction: Secondary | ICD-10-CM | POA: Diagnosis not present

## 2021-10-01 DIAGNOSIS — J984 Other disorders of lung: Secondary | ICD-10-CM | POA: Diagnosis not present

## 2021-10-01 DIAGNOSIS — C50919 Malignant neoplasm of unspecified site of unspecified female breast: Secondary | ICD-10-CM

## 2021-10-01 LAB — CBC WITH DIFFERENTIAL/PLATELET
Abs Immature Granulocytes: 0.07 10*3/uL (ref 0.00–0.07)
Basophils Absolute: 0 10*3/uL (ref 0.0–0.1)
Basophils Relative: 0 %
Eosinophils Absolute: 0 10*3/uL (ref 0.0–0.5)
Eosinophils Relative: 0 %
HCT: 28.8 % — ABNORMAL LOW (ref 36.0–46.0)
Hemoglobin: 9.1 g/dL — ABNORMAL LOW (ref 12.0–15.0)
Immature Granulocytes: 1 %
Lymphocytes Relative: 6 %
Lymphs Abs: 0.7 10*3/uL (ref 0.7–4.0)
MCH: 31.4 pg (ref 26.0–34.0)
MCHC: 31.6 g/dL (ref 30.0–36.0)
MCV: 99.3 fL (ref 80.0–100.0)
Monocytes Absolute: 0.6 10*3/uL (ref 0.1–1.0)
Monocytes Relative: 5 %
Neutro Abs: 11.6 10*3/uL — ABNORMAL HIGH (ref 1.7–7.7)
Neutrophils Relative %: 88 %
Platelets: 251 10*3/uL (ref 150–400)
RBC: 2.9 MIL/uL — ABNORMAL LOW (ref 3.87–5.11)
RDW: 16.5 % — ABNORMAL HIGH (ref 11.5–15.5)
WBC: 13 10*3/uL — ABNORMAL HIGH (ref 4.0–10.5)
nRBC: 0.2 % (ref 0.0–0.2)

## 2021-10-01 LAB — COMPREHENSIVE METABOLIC PANEL
ALT: 173 U/L — ABNORMAL HIGH (ref 0–44)
AST: 212 U/L — ABNORMAL HIGH (ref 15–41)
Albumin: 3.6 g/dL (ref 3.5–5.0)
Alkaline Phosphatase: 120 U/L (ref 38–126)
Anion gap: 17 — ABNORMAL HIGH (ref 5–15)
BUN: 59 mg/dL — ABNORMAL HIGH (ref 8–23)
CO2: 23 mmol/L (ref 22–32)
Calcium: 9.6 mg/dL (ref 8.9–10.3)
Chloride: 95 mmol/L — ABNORMAL LOW (ref 98–111)
Creatinine, Ser: 9.01 mg/dL — ABNORMAL HIGH (ref 0.44–1.00)
GFR, Estimated: 4 mL/min — ABNORMAL LOW (ref >60)
Glucose, Bld: 198 mg/dL — ABNORMAL HIGH (ref 70–99)
Potassium: 3.7 mmol/L (ref 3.5–5.1)
Sodium: 135 mmol/L (ref 135–145)
Total Bilirubin: 0.7 mg/dL (ref 0.3–1.2)
Total Protein: 6.7 g/dL (ref 6.5–8.1)

## 2021-10-01 LAB — MAGNESIUM: Magnesium: 2.2 mg/dL (ref 1.7–2.4)

## 2021-10-01 LAB — KAPPA/LAMBDA LIGHT CHAINS
Kappa free light chain: 190.9 mg/L — ABNORMAL HIGH (ref 3.3–19.4)
Kappa, lambda light chain ratio: 0.53 (ref 0.26–1.65)
Lambda free light chains: 361.6 mg/L — ABNORMAL HIGH (ref 5.7–26.3)

## 2021-10-01 LAB — LIPASE, BLOOD: Lipase: 74 U/L — ABNORMAL HIGH (ref 11–51)

## 2021-10-01 MED ORDER — ACETAMINOPHEN 650 MG RE SUPP: 650.0000 mg | Freq: Four times a day (QID) | RECTAL | Status: DC | PRN

## 2021-10-01 MED ORDER — ATENOLOL 25 MG PO TABS
25.0000 mg | ORAL_TABLET | Freq: Every day | ORAL | Status: DC
  Filled 2021-10-01: qty 1

## 2021-10-01 MED ORDER — SERTRALINE HCL 50 MG PO TABS
50.0000 mg | ORAL_TABLET | Freq: Every day | ORAL | Status: DC
  Administered 2021-10-02: 50 mg via ORAL
  Filled 2021-10-01: qty 1

## 2021-10-01 MED ORDER — PROMETHAZINE HCL 12.5 MG PO TABS: 12.5000 mg | ORAL_TABLET | Freq: Four times a day (QID) | ORAL | Status: DC | PRN

## 2021-10-01 MED ORDER — ACETAMINOPHEN 325 MG PO TABS: 650.0000 mg | ORAL_TABLET | Freq: Four times a day (QID) | ORAL | Status: DC | PRN

## 2021-10-01 MED ORDER — CHLORHEXIDINE GLUCONATE CLOTH 2 % EX PADS
6.0000 | MEDICATED_PAD | Freq: Every day | CUTANEOUS | Status: DC
Start: 2021-10-02 — End: 2021-10-03
  Administered 2021-10-02 – 2021-10-03 (×2): 6 via TOPICAL

## 2021-10-01 MED ORDER — POLYETHYLENE GLYCOL 3350 17 G PO PACK: 17.0000 g | PACK | Freq: Every day | ORAL | Status: DC | PRN

## 2021-10-01 MED ORDER — SERTRALINE HCL 50 MG PO TABS
25.0000 mg | ORAL_TABLET | Freq: Every day | ORAL | Status: DC
  Administered 2021-10-02: 25 mg via ORAL
  Filled 2021-10-01: qty 1

## 2021-10-01 MED ORDER — ANASTROZOLE 1 MG PO TABS
1.0000 mg | ORAL_TABLET | Freq: Every day | ORAL | Status: DC
  Administered 2021-10-02 – 2021-10-03 (×2): 1 mg via ORAL
  Filled 2021-10-01 (×5): qty 1

## 2021-10-01 MED ORDER — MELATONIN 3 MG PO TABS
6.0000 mg | ORAL_TABLET | Freq: Every day | ORAL | Status: DC
  Administered 2021-10-01 – 2021-10-02 (×2): 6 mg via ORAL
  Filled 2021-10-01 (×2): qty 2

## 2021-10-01 MED ORDER — ACYCLOVIR 200 MG PO CAPS
200.0000 mg | ORAL_CAPSULE | Freq: Every day | ORAL | Status: DC
  Administered 2021-10-02 – 2021-10-03 (×2): 200 mg via ORAL
  Filled 2021-10-01 (×2): qty 1

## 2021-10-01 MED ORDER — CLONIDINE HCL 0.1 MG PO TABS
0.1000 mg | ORAL_TABLET | Freq: Every day | ORAL | Status: DC
  Administered 2021-10-01 – 2021-10-02 (×2): 0.1 mg via ORAL
  Filled 2021-10-01 (×2): qty 1

## 2021-10-01 MED ORDER — PANTOPRAZOLE SODIUM 40 MG PO TBEC
40.0000 mg | DELAYED_RELEASE_TABLET | Freq: Two times a day (BID) | ORAL | Status: DC
  Administered 2021-10-01 – 2021-10-03 (×4): 40 mg via ORAL
  Filled 2021-10-01 (×4): qty 1

## 2021-10-01 MED ORDER — IOHEXOL 300 MG/ML  SOLN
100.0000 mL | Freq: Once | INTRAMUSCULAR | Status: AC | PRN
  Administered 2021-10-01: 100 mL via INTRAVENOUS

## 2021-10-01 MED ORDER — HEPARIN SODIUM (PORCINE) 5000 UNIT/ML IJ SOLN
5000.0000 [IU] | Freq: Three times a day (TID) | INTRAMUSCULAR | Status: DC
Start: 2021-10-01 — End: 2021-10-03
  Administered 2021-10-01 – 2021-10-03 (×5): 5000 [IU] via SUBCUTANEOUS
  Filled 2021-10-01 (×5): qty 1

## 2021-10-01 NOTE — ED Triage Notes (Signed)
Patient to ED from [facility] after [place] outside of room of heard patient vomiting. ST reports to EMS that upon entering room, patient was unresponsive leading staff to believe that patient has had a CVA. EMS states that patient was responsive on their arrival and following commands. Appears to have aspirated and continues to try to clear throat upon arrival to ED. Suctioned, patient continues to vomit. No neuro deficits noted. Patient is alert but not able to answer all questions. LKWT reported to charge nurse from [facility] staff was [id number], [id number] reported to EMS. PA called to room to assess patient, no code stroke indicated. Clothing changed due to vomiting. Patient receives dialysis and was scheduled today. Airway patent.

## 2021-10-01 NOTE — Assessment & Plan Note (Signed)
On dialysis Monday Wednesday Friday.  Due for dialysis today.  No sign of volume overload at this time.  Electrolytes are stable.  Vitals stable. - EDP talked to Dr. Hollie Salk, patient placed on the list for dialysis.

## 2021-10-01 NOTE — Assessment & Plan Note (Addendum)
Stable.  Not on medication for diabetes. -Resume nightly clonidine, atenolol - Monitor blood sugars daily

## 2021-10-01 NOTE — Assessment & Plan Note (Signed)
Resume anastrozole

## 2021-10-01 NOTE — Assessment & Plan Note (Signed)
Follows with Dr. Delton Coombes.  Per last note 08/2021, patient is on Velcade every 2 weeks.

## 2021-10-01 NOTE — Assessment & Plan Note (Addendum)
Episode of unresponsiveness, transient.  Patient back to baseline by the time EMS arrived.  Baseline dementia also.  Vomiting before episode of syncope concern for aspiration, CT chest without aspiration pneumonia.  No focal neurologic deficits.  No documented hypoxia. - Trops X 2

## 2021-10-01 NOTE — ED Provider Notes (Signed)
[facility] Provider Note   CSN: [id number] Arrival date & time: 10/01/21  0913     History  Chief Complaint  Patient presents with   Aspiration    Carly Wood is a 86 y.o. female.  HPI  Medical history including dementia, end-stage renal disease on dialysis Monday Wednesday Friday, diabetes, ductal carcinoma with right mastectomy, multiple myeloma on maintenance medication presents from the [facility] facility due to altered mental status.  Per EMS patient was found in her room vomiting and was apparently unresponsive, they are concerned for stroke, the PA at the facility evaluate the patient did not note any deficits or signs of a stroke.  She was then sent to the ER, EMS states that they did not note any focal deficits either and she was alert and responding to all questions.  Limited responses from patient as she does have dementia, but states that she has no complaints, no endorsing chest pain stomach pains denies any pain in the upper or lower extremities.    Home Medications Prior to Admission medications   Medication Sig Start Date End Date Taking? Authorizing Provider  acetaminophen (TYLENOL) 325 MG tablet Take 650 mg by mouth every 8 (eight) hours.   Yes [provider]  acyclovir (ZOVIRAX) 200 MG capsule Take 200 mg by mouth in the morning. (0800) 10/20/19  Yes [provider]  Amino Acids-Protein Hydrolys (FEEDING SUPPLEMENT, PRO-STAT SUGAR FREE 64,) LIQD Take 30 mLs by mouth 3 (three) times daily with meals. (0800, 1200 & 1800)   Yes [provider]  anastrozole (ARIMIDEX) 1 MG tablet TAKE 1 TABLET BY MOUTH DAILY 11/05/20  Yes Derek Jack, MD  atenolol (TENORMIN) 25 MG tablet Take 1 tablet (25 mg total) by mouth in the morning. (0800) 08/20/20 03/09[birth year] Yes Rhyne, Samantha J, PA-C  atorvastatin (LIPITOR) 10 MG tablet Take 10 mg by mouth at bedtime. (2000)   Yes [provider]  Calcium Acetate 667 MG  TABS Take 2 tablets by mouth 3 (three) times daily with meals. 1 tablet daily w/ snack   Yes [provider]  calcium-vitamin D (OSCAL WITH D) 500-200 MG-UNIT tablet Take 1 tablet by mouth in the morning. (0800)   Yes [provider]  cloNIDine (CATAPRES) 0.1 MG tablet Take 1 tablet (0.1 mg total) by mouth at bedtime. (2000) Hold for SBP < 110 bpm 08/20/20  Yes Rhyne, Samantha J, PA-C  docusate sodium (COLACE) 100 MG capsule Take 100 mg by mouth 2 (two) times daily.   Yes [provider]  melatonin 3 MG TABS tablet Take 6 mg by mouth at bedtime. (2000) For Sleep 10/17/19  Yes [provider]  multivitamin (RENA-VIT) TABS tablet Take 1 tablet by mouth at bedtime. (2100)   Yes [provider]  ondansetron (ZOFRAN) 4 MG tablet Take 1 tablet (4 mg total) by mouth every 6 (six) hours as needed for nausea. 07/26/20  Yes Emokpae, Courage, MD  pantoprazole (PROTONIX) 40 MG tablet Take 40 mg by mouth 2 (two) times daily.   Yes [provider]  sertraline (ZOLOFT) 25 MG tablet Take 25 mg by mouth daily. At 9 am along with 50 mg tablet for a total of 75 mg daily   Yes [provider]  sertraline (ZOLOFT) 50 MG tablet Take 50 mg by mouth daily. At 9 am along with 25 mg tablet for a total of 75 mg daily   Yes [provider]  sucralfate (CARAFATE) 1  GM/10ML suspension Take 1 g by mouth in the morning, at noon, in the evening, and at bedtime. (0800, 1200, 1700 & 2100)   Yes [provider]  Insulin Pen Needle (BD AUTOSHIELD DUO) 30G X 5 MM MISC by Does not apply route. 3/16"    [provider]      Allergies    Ace inhibitors, Angiotensin receptor blockers, Penicillins, and Penicillin g    Review of Systems   Review of Systems  Unable to perform ROS: Dementia    Physical Exam Updated Vital Signs BP 128/64   Pulse 70   Temp 98.5 F (36.9 C) (Oral)   Resp 18   Ht _0  (1.6 m)   Wt 61.1 kg   SpO2 94%   BMI 23.86  kg/m  Physical Exam Vitals and nursing note reviewed.  Constitutional:      General: She is not in acute distress.    Appearance: She is not ill-appearing.  HENT:     Head: Normocephalic and atraumatic.     Comments: No deformity of the head presents no raccoon eyes or battle sign noted.    Nose: No congestion.     Mouth/Throat:     Mouth: Mucous membranes are moist.     Pharynx: Oropharynx is clear.  Eyes:     Extraocular Movements: Extraocular movements intact.     Conjunctiva/sclera: Conjunctivae normal.     Pupils: Pupils are equal, round, and reactive to light.  Cardiovascular:     Rate and Rhythm: Normal rate and regular rhythm.     Pulses: Normal pulses.     Heart sounds: No murmur heard.    No friction rub. No gallop.  Pulmonary:     Effort: No respiratory distress.     Breath sounds: No wheezing, rhonchi or rales.     Comments: Slightly tachypneic but not hypoxic speaking full sentences, intermittent rhonchi heard in the lower lobes bilaterally without rales or stridor present.  Dialysis catheter noted in the left upper chest, no evidence of infection present. Abdominal:     Palpations: Abdomen is soft.     Tenderness: There is no abdominal tenderness. There is no right CVA tenderness or left CVA tenderness.  Musculoskeletal:     Right lower leg: No edema.     Left lower leg: No edema.  Skin:    General: Skin is warm and dry.  Neurological:     Mental Status: She is alert.     Comments: No facial asymmetry no difficulty with word finding following two-step commands no unilateral weakness present.  Psychiatric:        Mood and Affect: Mood normal.     ED Results / Procedures / Treatments   Labs (all labs ordered are listed, but only abnormal results are displayed) Labs Reviewed  COMPREHENSIVE METABOLIC PANEL - Abnormal; Notable for the following components:      Result Value   Chloride 95 (*)    Glucose, Bld 198 (*)    BUN 59 (*)    Creatinine, Ser 9.01  (*)    AST 212 (*)    ALT 173 (*)    GFR, Estimated 4 (*)    Anion gap 17 (*)    All other components within normal limits  LIPASE, BLOOD - Abnormal; Notable for the following components:   Lipase 74 (*)    All other components within normal limits  CBC WITH DIFFERENTIAL/PLATELET - Abnormal; Notable for the following components:   WBC  13.0 (*)    RBC 2.90 (*)    Hemoglobin 9.1 (*)    HCT 28.8 (*)    RDW 16.5 (*)    Neutro Abs 11.6 (*)    All other components within normal limits  MAGNESIUM    EKG None  Radiology CT CHEST ABDOMEN PELVIS W CONTRAST  Result Date: 10/01/2021 CLINICAL DATA:  Concern for aspiration pneumonia. Abnormal liver labs. Confusion. EXAM: CT CHEST, ABDOMEN, AND PELVIS WITH CONTRAST TECHNIQUE: Multidetector CT imaging of the chest, abdomen and pelvis was performed following the standard protocol during bolus administration of intravenous contrast. RADIATION DOSE REDUCTION: This exam was performed according to the departmental dose-optimization program which includes automated exposure control, adjustment of the mA and/or kV according to patient size and/or use of iterative reconstruction technique. CONTRAST:  117m OMNIPAQUE IOHEXOL 300 MG/ML  SOLN COMPARISON:  Ultrasound 10/01/2021.  Radiography 10/01/2021. FINDINGS: CT CHEST FINDINGS Cardiovascular: Heart size is normal. Extensive coronary artery calcification. Aortic atherosclerotic calcification. Aortic valve region calcification. Mediastinum/Nodes: No mediastinal or hilar mass or lymphadenopathy. Small hiatal hernia. Lungs/Pleura: The lung parenchyma is clear. No infiltrate or collapse. Small area of scarring in the medial left lower lobe. Musculoskeletal: Curvature in chronic degenerative change of the spine. Bridging thoracic osteophytes. Acute appearing superior endplate fracture at TW97 L1 and L2. CT ABDOMEN PELVIS FINDINGS Hepatobiliary: Liver parenchyma is normal. Multiple stones within the gallbladder. No CT  sign of cholecystitis or obstruction. Pancreas: 1 x 2 cm cystic abnormality in the ventral head of the pancreas. Spleen: Normal Adrenals/Urinary Tract: Adrenal glands are normal. Renal cortical atrophy with multiple cysts. Extensive renovascular calcification including a 1 cm aneurysm in the right renal hilum. Nonobstructing 5 mm stone in the lower pole of the left kidney. Exophytic abnormality at the inferior pole of the right kidney is indeterminate for cyst versus mass, measuring up to 2.7 cm in size. Bladder is normal. Stomach/Bowel: Small hiatal hernia. Stomach otherwise normal. Small bowel pattern is normal. No sign of appendicitis. Mild diverticulosis of the colon without visible diverticulitis. Vascular/Lymphatic: Aortic atherosclerosis. No aneurysm. IVC is normal. No adenopathy. Reproductive: Multiple calcified leiomyomas of the uterus. No other pelvic masses seen. Other: No free fluid or air. Musculoskeletal: As noted above, probably acute superior endplate fractures at TL89 L1 and L2. Lower lumbar degenerative changes with anterolisthesis at L4-5 and probable stenosis at this level. Previous gamma nail fixation of a right femur fracture. Probable Paget's disease within the right hemipelvis. IMPRESSION: No aspiration pneumonia. Mild focal scarring at the medial left lung base. Coronary artery calcification. Aortic atherosclerotic calcification. Aortic Atherosclerosis (ICD10-I70.0). Probably acute superior endplate fractures at TQ11 L1 and L2. No retropulsed bone. Renal atrophy. Multiple renal cysts. Indeterminate 2.7 cm lesion at the lower pole the right kidney could be a complicated cyst or a mass. This could be evaluated with renal MRI. 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 Nonobstructing 5 mm stone in the lower pole of the left kidney. 1 x 2 cm cystic abnormality in the ventral head of the pancreas. Best practice recommendation is for reimaging of this every 6 months for 2 years and then every 1 year  for 2 years and then every 2 years for 6 years. Follow-up can be discontinued if stable for 10 years. This recommendation could be tailored to this patient's clinical status and life expectancy. Paget's disease of the right hemipelvis. Diverticulosis without evidence of diverticulitis. Small hiatal hernia. Multiple calcified leiomyomas the uterus. Electronically Signed   By: MElta Guadeloupe  Shogry M.D.   On: 10/01/2021 14:44   US Abdomen Limited RUQ (LIVER/GB)  Result Date: 10/01/2021 CLINICAL DATA:  Vomiting, found unresponsive EXAM: ULTRASOUND ABDOMEN LIMITED RIGHT UPPER QUADRANT COMPARISON:  None Available. FINDINGS: Gallbladder: No wall thickening. Gallstones are present, largest measures 2.4 cm. No sonographic Murphy sign noted by sonographer. Common bile duct: Diameter: 2.4 mm Liver: No focal lesion identified. Increased parenchymal echogenicity. Portal vein is patent on color Doppler imaging with normal direction of blood flow towards the liver. Other: Evaluation somewhat limited due to patient positioning. IMPRESSION: 1. Cholelithiasis with no evidence of acute cholecystitis. 2. Increased echogenicity of the liver parenchyma, findings suggestive of hepatic steatosis. Electronically Signed   By: Yetta Glassman M.D.   On: 10/01/2021 12:08   DG Chest Portable 1 View  Result Date: 10/01/2021 CLINICAL DATA:  Possible aspiration. EXAM: PORTABLE CHEST 1 VIEW COMPARISON:  Radiographs 07/23/2020 and 07/14/2020. FINDINGS: 0957 hours. Left IJ hemodialysis catheter projects to the level of the upper right atrium. The heart size and mediastinal contours are stable with aortic atherosclerosis. There is mild atelectasis or scarring at both lung bases, but no confluent airspace opacity, edema, pneumothorax or significant pleural effusion. Small surgical clips are present in the right axilla. No acute osseous findings are evident. Telemetry leads overlie the chest. IMPRESSION: No evidence of active cardiopulmonary process.  Electronically Signed   By: Richardean Sale M.D.   On: 10/01/2021 10:10    Procedures .Critical Care  Performed by: Marcello Fennel, PA-C Authorized by: Marcello Fennel, PA-C   Critical care provider statement:    Critical care time (minutes):  30   Critical care time was exclusive of:  Separately billable procedures and treating other patients   Critical care was necessary to treat or prevent imminent or life-threatening deterioration of the following conditions: Speaking with multiple consultants regarding patient's conditions.   Critical care was time spent personally by me on the following activities:  Development of treatment plan with patient or surrogate, discussions with consultants, evaluation of patient's response to treatment, examination of patient, ordering and review of laboratory studies, ordering and review of radiographic studies, ordering and performing treatments and interventions, pulse oximetry, re-evaluation of patient's condition and review of old charts   I assumed direction of critical care for this patient from another provider in my specialty: no     Care discussed with: admitting provider       Medications Ordered in ED Medications  iohexol (OMNIPAQUE) 300 MG/ML solution 100 mL (100 mLs Intravenous Contrast Given 10/01/21 1416)    ED Course/ Medical Decision Making/ A&P                           Medical Decision Making Amount and/or Complexity of Data Reviewed Labs: ordered. Radiology: ordered.  Risk Prescription drug management. Decision regarding hospitalization.   This patient presents to the ED for concern of altered mental status, this involves an extensive number of treatment options, and is a complaint that carries with it a high risk of complications and morbidity.  The differential diagnosis includes CVA, electrolyte derailment, pneumonia    Additional history obtained:  Additional history obtained from EMS External records from  outside source obtained and reviewed including PCP notes   Co morbidities that complicate the patient evaluation  End-stage renal disease  Social Determinants of Health:  Dementia    Lab Tests:  I Ordered, and personally interpreted labs.  The pertinent results include:  CBC shows leukocytosis, hemoglobin 9.1 at baseline for patient, CMP shows glucose of 95 glucose 198, BUN 59, creatinine 9, AST 212 ALT 173 and gap 17   Imaging Studies ordered:  I ordered imaging studies including chest x-ray, limited ultrasound, CT chest abdomen pelvis I independently visualized and interpreted imaging which showed negative acute findings, ultrasound shows cholelithiasis without acute cholecystitis multiple gallstones present.  Acute superior endplate fractures at J03 L1-L2, nonobstructive 5 mm kidney stone left side, as well as 1 x 2 cm cyst noted in the pancreatic head. I agree with the radiologist interpretation   Cardiac Monitoring:  The patient was maintained on a cardiac monitor.  I personally viewed and interpreted the cardiac monitored which showed an underlying rhythm of: EKG without signs of ischemia   Medicines ordered and prescription drug management:  I ordered medication including N/A I have reviewed the patients home medicines and have made adjustments as needed  Critical Interventions:  N/A   Reevaluation:  Presents with altered mental status, on my exam she was alert, able to follow some commands, no evidence of acute stroke present.  Slightly tachypneic on my exam, will obtain lab work imaging and reassess.  Lab work shows elevation liver enzymes, ultrasound showing gallstones without acute cholecystitis, due to her coarse breathing concerning for aspiration as well as elevation liver signs we will proceed with CT imaging for further evaluation  Stones present, will consult with GI, general surgery as well as nephrology for further evaluation   Consultations  Obtained:  I requested consultation with the nephrologist Dr. Hollie Salk,  and discussed lab and imaging findings as well as pertinent plan - they recommend: We will place him on the list for dialysis. [facility]-does not feel that transaminitis is secondary due to cyst seen on pancreas, feels it is more less coming from stone seen in his gallbladder recommends trending liver enzymes.  Available consultation if needed Spoke with Dr. Arnoldo Morale general surgery recommends further observing liver enzymes and white count, he will assess the patient, does not recommend antibiotics at this time. Spoke with Dr. Denton Brick  will admit the patient    Test Considered:  CT head-deferred by suspicion for intracranial head bleed is low at this time, no evidence of trauma on exam, no recent head trauma, no focal deficit on my exam not on anticoag's    Rule out  Low suspicion for CVA she has no focal deficit present my exam.  Low suspicion for dissection of the vertebral or carotid artery as presentation atypical of etiology.  Low suspicion for meningitis as she has no meningeal sign present.  Low suspicion for seizures presentation atypical, no history of seizures, no postictal state, no generalized body shaking.  Low suspicion for pneumonia at rest for pneumonia CT imaging is negative for this.  I have low suspicion for cholecystitis as imaging is negative for this.  I have low suspicion for diverticulitis, bowel obstruction volvulus, imaging is all negative these findings.  Low sent for ACS denies any chest pain EKG without signs of ischemia.      Dispostion and problem list  After consideration of the diagnostic results and the patients response to treatment, I feel that the patent would benefit from admission.  Nausea vomiting-unclear allergy, possibly coming from symptomatic cholelithiasis, patient will need further observation and formal consultation by general surgery. Syncope-unclear  etiology, I doubt this was an actual syncopal event, suspect this was from nausea and vomiting, patient will need further observation.  Final Clinical Impression(s) / ED Diagnoses Final diagnoses:  Syncope, unspecified syncope type  Calculus of gallbladder without cholecystitis without obstruction    Rx / DC Orders ED Discharge Orders     None         Marcello Fennel, PA-C 10/01/21 1624    Milton Ferguson, MD 10/02/21 1721

## 2021-10-01 NOTE — Assessment & Plan Note (Addendum)
AST 212, ALT 173.  Normal ALP and total bilirubin at 120 and 0.7 respectively.  Lipase mildly elevated at 74.  Liver enzymes just checked yesterday were within normal limits. -Obtain hepatitis panel -Right upper quadrant ultrasound showing cholelithiasis, but patient has no abdominal pain. - EDP consulted general surgery, no need for acute surgical intervention, check LFTs in the morning -Hold Lipitor '10mg'$ 

## 2021-10-01 NOTE — H&P (Addendum)
History and Physical    Carly Wood ZFP:[id number] DOB: [birth year] DOA: 10/01/2021  PCP: Gerlene Fee, NP   Patient coming from: [facility]  I have personally briefly reviewed patient's old medical records in [place]  Chief Complaint: Vomiting, unresponsiveness  HPI: Carly Wood is a 86 y.o. female with medical history significant for ESRD, hypertension, diabetes mellitus, dementia, multiple myeloma. Patient presented to the ED via EMS reports the patient was initially vomiting then became unresponsive.  At the time of EMS arrival, patient was responsive and following commands.  Concern the patient may have aspirated while vomiting may have had a stroke due to unresponsiveness.  Patient was still vomiting on arrival to the ED, attempting to clear her throat, which was suctioned in the ED. On my evaluation, patient is awake alert oriented to person and place.  She is unable to tell me exactly why she was brought to the ED.  She tells me she vomited today after eating breakfast.  She denies pain, denies difficulty breathing.  Denies chest pain.  Denies diarrhea denies abdominal pain.  ED Course: O2 sats greater than 92% on room air.  Afebrile temperature 98.5.  Heart rate 60s.  WBC 13.  Elevated liver enzymes AST 212, ALT 173, normal ALP 120, T. bili 0.7, lipase 74. RUQ ultrasound shows cholelithiasis without acute cholecystitis, and findings suggestive of hepatic steatosis.  Abdominal chest abdomen and pelvis with contrast-no aspiration, renal atrophy with cysts, renal MRI suggested for follow-up.  EDP consulted general surgery, and GI.  Plan to trend liver enzymes for now.  No urgent Intervention needed.  Review of Systems: As per HPI all other systems reviewed and negative.  Past Medical History:  Diagnosis Date   Anemia     chronic macrocytic anemia   Anxiety    Chronic kidney disease    Chronic renal disease, stage 4, severely decreased glomerular filtration  rate (GFR) between 15-29 mL/min/1.73 square meter ([place]) 01/06/2810   Complication of anesthesia    delirious after Breast Surgery   Dementia ([place])    mild   Depression    Diabetes mellitus with ESRD (end-stage renal disease) ([place])    type II   Dysphagia    Dyspnea    with activity   GERD (gastroesophageal reflux disease)    Glaucoma    Hyperlipidemia    Hypertension    Pneumonia    Stage 1 infiltrating ductal carcinoma of right female breast ([place]) 08/21/2015   ER+ PR+ HER 2 neu + (3+) T1cN0     Past Surgical History:  Procedure Laterality Date   A/V FISTULAGRAM Right 07/05/2020   Procedure: A/V Fistulagram;  Surgeon: Elam Dutch, MD;  Location: [facility];  Service: Cardiovascular;  Laterality: Right;   AV FISTULA PLACEMENT Left 11/22/2017   Procedure: ARTERIOVENOUS (AV) FISTULA CREATION LEFT ARM;  Surgeon: Elam Dutch, MD;  Location: [facility] OR;  Service: Vascular;  Laterality: Left;   AV FISTULA PLACEMENT Right 04/04/2020   Procedure: RIGHT ARM ARTERIOVENOUS FISTULA CREATION;  Surgeon: Rosetta Posner, MD;  [facility];  Service: Vascular;  Laterality: Right;   AV FISTULA PLACEMENT Right 08/20/2020   Procedure: ARTERIOVENOUS (AV) FISTULA LIGATION RIGHT ARM;  Surgeon: Elam Dutch, MD;  Location: [place];  Service: Vascular;  Laterality: Right;   BIOPSY  08/07/2016   Procedure: BIOPSY;  Surgeon: Daneil Dolin, MD;  [facility];  Service: Endoscopy;;  gastric ulcer biopsy  COLONOSCOPY     ESOPHAGOGASTRODUODENOSCOPY N/A 08/07/2016   LA Grade A esophagitis s/p dilation, small hiatal hernia, multiple gastric ulcers and erosions, duodenal erosions s/p biopsy. Negative H.pylori    ESOPHAGOGASTRODUODENOSCOPY N/A 11/27/2016   normal esophagus, previously noted gastric ulcers completely healed, normal duodenum.    ESOPHAGOGASTRODUODENOSCOPY (EGD) WITH PROPOFOL N/A 07/23/2020   Procedure: ESOPHAGOGASTRODUODENOSCOPY (EGD) WITH PROPOFOL;  Surgeon: Rogene Houston, MD;  [facility];  Service: Endoscopy;  Laterality: N/A;   FISTULA SUPERFICIALIZATION Left 02/14/2018   Procedure: FISTULA SUPERFICIALIZATION LEFT ARM;  Surgeon: Angelia Mould, MD;  Location: [facility] OR;  Service: Vascular;  Laterality: Left;   FRACTURE SURGERY Right    ankle   HOT HEMOSTASIS  07/23/2020   Procedure: HOT HEMOSTASIS (ARGON PLASMA COAGULATION/BICAP);  Surgeon: Rogene Houston, MD;  [facility];  Service: Endoscopy;;   INTRAMEDULLARY (IM) NAIL INTERTROCHANTERIC Right 07/12/2020   Procedure: INTRAMEDULLARY (IM) NAIL INTERTROCHANTRIC;  Surgeon: Mordecai Rasmussen, MD;  [facility];  Service: Orthopedics;  Laterality: Right;   MALONEY DILATION N/A 08/07/2016   Procedure: Venia Minks DILATION;  Surgeon: Daneil Dolin, MD;  [facility];  Service: Endoscopy;  Laterality: N/A;   MASTECTOMY, PARTIAL Right    multiple myeloma     PERIPHERAL VASCULAR BALLOON ANGIOPLASTY Left 07/13/2019   Procedure: PERIPHERAL VASCULAR BALLOON ANGIOPLASTY;  Surgeon: Marty Heck, MD;  Location: [facility];  Service: Cardiovascular;  Laterality: Left;  arm fistulogram   PERIPHERAL VASCULAR BALLOON ANGIOPLASTY Right 05/22/2020   Procedure: PERIPHERAL VASCULAR BALLOON ANGIOPLASTY;  Surgeon: Cherre Robins, MD;  Location: [facility];  Service: Cardiovascular;  Laterality: Right;  arm fistula   PERIPHERAL VASCULAR BALLOON ANGIOPLASTY Right 07/05/2020   Procedure: PERIPHERAL VASCULAR BALLOON ANGIOPLASTY;  Surgeon: Elam Dutch, MD;  Location: [facility];  Service: Cardiovascular;  Laterality: Right;  arm fistula   PORT-A-CATH REMOVAL Left 11/22/2017   Procedure: REMOVAL PORT-A-CATH LEFT CHEST;  Surgeon: Elam Dutch, MD;  Location: [facility];  Service: Vascular;  Laterality: Left;   RETINAL DETACHMENT SURGERY Right    SCLEROTHERAPY  07/23/2020   Procedure: SCLEROTHERAPY;  Surgeon: Rogene Houston, MD;  [facility];  Service:  Endoscopy;;   UPPER EXTREMITY VENOGRAPHY N/A 08/22/2020   Procedure: LEFT UPPER & CENTRAL VENOGRAPHY;  Surgeon: Marty Heck, MD;  Location: [facility];  Service: Cardiovascular;  Laterality: N/A;     reports that she has never smoked. She has never used smokeless tobacco. She reports that she does not drink alcohol and does not use drugs.  Allergies  Allergen Reactions   Ace Inhibitors Other (See Comments) and Cough    Tongue swell , ie angioedema   Angiotensin Receptor Blockers     Angioedema with ACE-I   Penicillins Other (See Comments)    Unsure of reaction Has patient had a PCN reaction causing immediate rash, facial/tongue/throat swelling, SOB or lightheadedness with hypotension: Unknown Has patient had a PCN reaction causing severe rash involving mucus membranes or skin necrosis: Unknown Has patient had a PCN reaction that required hospitalization: No Has patient had a PCN reaction occurring within the last 10 years: Unknown If all of the above answers are "NO", then may proceed with Cephalosporin use.     Penicillin G     Family History  Problem Relation Age of Onset   Multiple myeloma Sister    Brain cancer Sister    Dementia Mother  died at 60   Stroke Mother    Heart failure Mother    Diabetes Mother    Heart disease Father    Prostate cancer Brother    Colon cancer Neg Hx     Prior to Admission medications   Medication Sig Start Date End Date Taking? Authorizing Provider  acetaminophen (TYLENOL) 325 MG tablet Take 650 mg by mouth every 8 (eight) hours.   Yes [provider]  acyclovir (ZOVIRAX) 200 MG capsule Take 200 mg by mouth in the morning. (0800) 10/20/19  Yes [provider]  Amino Acids-Protein Hydrolys (FEEDING SUPPLEMENT, PRO-STAT SUGAR FREE 64,) LIQD Take 30 mLs by mouth 3 (three) times daily with meals. (0800, 1200 & 1800)   Yes [provider]  anastrozole (ARIMIDEX) 1 MG tablet TAKE 1 TABLET BY MOUTH  DAILY 11/05/20  Yes Derek Jack, MD  atenolol (TENORMIN) 25 MG tablet Take 1 tablet (25 mg total) by mouth in the morning. (0800) 08/20/20 03/09/48 Yes Rhyne, Samantha J, PA-C  atorvastatin (LIPITOR) 10 MG tablet Take 10 mg by mouth at bedtime. (2000)   Yes [provider]  Calcium Acetate 667 MG TABS Take 2 tablets by mouth 3 (three) times daily with meals. 1 tablet daily w/ snack   Yes [provider]  calcium-vitamin D (OSCAL WITH D) 500-200 MG-UNIT tablet Take 1 tablet by mouth in the morning. (0800)   Yes [provider]  cloNIDine (CATAPRES) 0.1 MG tablet Take 1 tablet (0.1 mg total) by mouth at bedtime. (2000) Hold for SBP < 110 bpm 08/20/20  Yes Rhyne, Samantha J, PA-C  docusate sodium (COLACE) 100 MG capsule Take 100 mg by mouth 2 (two) times daily.   Yes [provider]  melatonin 3 MG TABS tablet Take 6 mg by mouth at bedtime. (2000) For Sleep 10/17/19  Yes [provider]  multivitamin (RENA-VIT) TABS tablet Take 1 tablet by mouth at bedtime. (2100)   Yes [provider]  ondansetron (ZOFRAN) 4 MG tablet Take 1 tablet (4 mg total) by mouth every 6 (six) hours as needed for nausea. 07/26/20  Yes , Courage, MD  pantoprazole (PROTONIX) 40 MG tablet Take 40 mg by mouth 2 (two) times daily.   Yes [provider]  sertraline (ZOLOFT) 25 MG tablet Take 25 mg by mouth daily. At 9 am along with 50 mg tablet for a total of 75 mg daily   Yes [provider]  sertraline (ZOLOFT) 50 MG tablet Take 50 mg by mouth daily. At 9 am along with 25 mg tablet for a total of 75 mg daily   Yes [provider]  sucralfate (CARAFATE) 1 GM/10ML suspension Take 1 g by mouth in the morning, at noon, in the evening, and at bedtime. (0800, 1200, 1700 & 2100)   Yes [provider]  Insulin Pen Needle (BD AUTOSHIELD DUO) 30G X 5 MM MISC by Does not apply route. 3/16"    [provider]    Physical Exam: Vitals:    10/01/21 1715 10/01/21 1730 10/01/21 1807 10/01/21 1845  BP:  132/62  (!) 153/64  Pulse:    63  Resp: (!) 29 (!) 29  (!) 22  Temp:   98.2 F (36.8 C) 98.1 F (36.7 C)  TempSrc:   Oral Oral  SpO2:    100%  Weight:    66.4 kg  Height:    5' 3" (1.6 m)    Constitutional: NAD, calm, comfortable Vitals:  10/01/21 1715 10/01/21 1730 10/01/21 1807 10/01/21 1845  BP:  132/62  (!) 153/64  Pulse:    63  Resp: (!) 29 (!) 29  (!) 22  Temp:   98.2 F (36.8 C) 98.1 F (36.7 C)  TempSrc:   Oral Oral  SpO2:    100%  Weight:    66.4 kg  Height:    5' 3" (1.6 m)   Eyes: PERRL, lids and conjunctivae normal ENMT: Mucous membranes are moist.  Neck: normal, supple, no masses, no thyromegaly Respiratory: clear to auscultation bilaterally, no wheezing, no crackles. Normal respiratory effort. No accessory muscle use.  Cardiovascular: Regular rate and rhythm, no murmurs / rubs / gallops.  Abdomen: no tenderness, no masses palpated. No hepatosplenomegaly. Bowel sounds positive.  Musculoskeletal: no clubbing / cyanosis. No joint deformity upper and lower extremities. Skin: no rashes, lesions, ulcers. No induration Neurologic: No apparent cranial nerve abnormality, 4+5 strength in all extremities.  No facial asymmetry, speech fluent without evidence of aphasia. Psychiatric: Baseline Dementia, alert and oriented x person and place. Normal mood.   Labs on Admission: I have personally reviewed following labs and imaging studies  CBC: Recent Labs  Lab 09/30/21 1216 10/01/21 1032  WBC 6.6 13.0*  NEUTROABS 4.9 11.6*  HGB 9.6* 9.1*  HCT 31.1* 28.8*  MCV 101.3* 99.3  PLT 232 700   Basic Metabolic Panel: Recent Labs  Lab 09/30/21 1216 10/01/21 1032  NA 136 135  K 4.0 3.7  CL 96* 95*  CO2 27 23  GLUCOSE 222* 198*  BUN 40* 59*  CREATININE 6.87* 9.01*  CALCIUM 9.2 9.6  MG  --  2.2   GFR: Estimated Creatinine Clearance: 4.3 mL/min (A) (by C-G formula based on SCr of 9.01 mg/dL  (H)). Liver Function Tests: Recent Labs  Lab 09/30/21 1216 10/01/21 1032  AST 18 212*  ALT 12 173*  ALKPHOS 69 120  BILITOT 0.5 0.7  PROT 6.8 6.7  ALBUMIN 3.7 3.6   Recent Labs  Lab 10/01/21 1032  LIPASE 74*    Radiological Exams on Admission: CT CHEST ABDOMEN PELVIS W CONTRAST  Result Date: 10/01/2021 CLINICAL DATA:  Concern for aspiration pneumonia. Abnormal liver labs. Confusion. EXAM: CT CHEST, ABDOMEN, AND PELVIS WITH CONTRAST TECHNIQUE: Multidetector CT imaging of the chest, abdomen and pelvis was performed following the standard protocol during bolus administration of intravenous contrast. RADIATION DOSE REDUCTION: This exam was performed according to the departmental dose-optimization program which includes automated exposure control, adjustment of the mA and/or kV according to patient size and/or use of iterative reconstruction technique. CONTRAST:  131m OMNIPAQUE IOHEXOL 300 MG/ML  SOLN COMPARISON:  Ultrasound 10/01/2021.  Radiography 10/01/2021. FINDINGS: CT CHEST FINDINGS Cardiovascular: Heart size is normal. Extensive coronary artery calcification. Aortic atherosclerotic calcification. Aortic valve region calcification. Mediastinum/Nodes: No mediastinal or hilar mass or lymphadenopathy. Small hiatal hernia. Lungs/Pleura: The lung parenchyma is clear. No infiltrate or collapse. Small area of scarring in the medial left lower lobe. Musculoskeletal: Curvature in chronic degenerative change of the spine. Bridging thoracic osteophytes. Acute appearing superior endplate fracture at TF74 L1 and L2. CT ABDOMEN PELVIS FINDINGS Hepatobiliary: Liver parenchyma is normal. Multiple stones within the gallbladder. No CT sign of cholecystitis or obstruction. Pancreas: 1 x 2 cm cystic abnormality in the ventral head of the pancreas. Spleen: Normal Adrenals/Urinary Tract: Adrenal glands are normal. Renal cortical atrophy with multiple cysts. Extensive renovascular calcification including a 1 cm  aneurysm in the right renal hilum. Nonobstructing 5 mm stone in the lower  pole of the left kidney. Exophytic abnormality at the inferior pole of the right kidney is indeterminate for cyst versus mass, measuring up to 2.7 cm in size. Bladder is normal. Stomach/Bowel: Small hiatal hernia. Stomach otherwise normal. Small bowel pattern is normal. No sign of appendicitis. Mild diverticulosis of the colon without visible diverticulitis. Vascular/Lymphatic: Aortic atherosclerosis. No aneurysm. IVC is normal. No adenopathy. Reproductive: Multiple calcified leiomyomas of the uterus. No other pelvic masses seen. Other: No free fluid or air. Musculoskeletal: As noted above, probably acute superior endplate fractures at V95, L1 and L2. Lower lumbar degenerative changes with anterolisthesis at L4-5 and probable stenosis at this level. Previous gamma nail fixation of a right femur fracture. Probable Paget's disease within the right hemipelvis. IMPRESSION: No aspiration pneumonia. Mild focal scarring at the medial left lung base. Coronary artery calcification. Aortic atherosclerotic calcification. Aortic Atherosclerosis (ICD10-I70.0). Probably acute superior endplate fractures at G38, L1 and L2. No retropulsed bone. Renal atrophy. Multiple renal cysts. Indeterminate 2.7 cm lesion at the lower pole the right kidney could be a complicated cyst or a mass. This could be evaluated with renal MRI. 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 Nonobstructing 5 mm stone in the lower pole of the left kidney. 1 x 2 cm cystic abnormality in the ventral head of the pancreas. Best practice recommendation is for reimaging of this every 6 months for 2 years and then every 1 year for 2 years and then every 2 years for 6 years. Follow-up can be discontinued if stable for 10 years. This recommendation could be tailored to this patient's clinical status and life expectancy. Paget's disease of the right hemipelvis. Diverticulosis without evidence of  diverticulitis. Small hiatal hernia. Multiple calcified leiomyomas the uterus. Electronically Signed   By: Nelson Chimes M.D.   On: 10/01/2021 14:44   US Abdomen Limited RUQ (LIVER/GB)  Result Date: 10/01/2021 CLINICAL DATA:  Vomiting, found unresponsive EXAM: ULTRASOUND ABDOMEN LIMITED RIGHT UPPER QUADRANT COMPARISON:  None Available. FINDINGS: Gallbladder: No wall thickening. Gallstones are present, largest measures 2.4 cm. No sonographic Murphy sign noted by sonographer. Common bile duct: Diameter: 2.4 mm Liver: No focal lesion identified. Increased parenchymal echogenicity. Portal vein is patent on color Doppler imaging with normal direction of blood flow towards the liver. Other: Evaluation somewhat limited due to patient positioning. IMPRESSION: 1. Cholelithiasis with no evidence of acute cholecystitis. 2. Increased echogenicity of the liver parenchyma, findings suggestive of hepatic steatosis. Electronically Signed   By: Yetta Glassman M.D.   On: 10/01/2021 12:08   DG Chest Portable 1 View  Result Date: 10/01/2021 CLINICAL DATA:  Possible aspiration. EXAM: PORTABLE CHEST 1 VIEW COMPARISON:  Radiographs 07/23/2020 and 07/14/2020. FINDINGS: 0957 hours. Left IJ hemodialysis catheter projects to the level of the upper right atrium. The heart size and mediastinal contours are stable with aortic atherosclerosis. There is mild atelectasis or scarring at both lung bases, but no confluent airspace opacity, edema, pneumothorax or significant pleural effusion. Small surgical clips are present in the right axilla. No acute osseous findings are evident. Telemetry leads overlie the chest. IMPRESSION: No evidence of active cardiopulmonary process. Electronically Signed   By: Richardean Sale M.D.   On: 10/01/2021 10:10    EKG: Independently reviewed.  Sinus rhythm rate 79, QTc 483.  No significant change from prior.  Assessment/Plan Principal Problem:   Syncope Active Problems:   Elevated liver enzymes    ESRD (end stage renal disease) (HCC)   Multiple myeloma (HCC)   Diabetes mellitus  with ESRD (end-stage renal disease) ([place])   Hypertension due to end stage renal disease caused by type 2 diabetes mellitus, on dialysis [facility])   Vascular dementia with behavior disturbance (HCC)   Breast cancer ([place])   Assessment and Plan: * Syncope Episode of unresponsiveness, transient.  Patient back to baseline by the time EMS arrived.  Baseline dementia also.  Vomiting before episode of syncope concern for aspiration, CT chest without aspiration pneumonia.  No focal neurologic deficits.  No documented hypoxia. - Trops X 2  Elevated liver enzymes AST 212, ALT 173.  Normal ALP and total bilirubin at 120 and 0.7 respectively.  Lipase mildly elevated at 74.  Liver enzymes just checked yesterday were within normal limits. -Obtain hepatitis panel -Right upper quadrant ultrasound showing cholelithiasis, but patient has no abdominal pain. - EDP consulted general surgery, no need for acute surgical intervention, check LFTs in the morning -Hold Lipitor 70m  ESRD (end stage renal disease) ([place] On dialysis Monday Wednesday Friday.  Due for dialysis today.  No sign of volume overload at this time.  Electrolytes are stable.  Vitals stable. - EDP talked to Dr. UHollie Salk patient placed on the list for dialysis.  Breast cancer ([place] Resume anastrozole  Vascular dementia with behavior disturbance (HCC) Resume sertraline,  Hypertension due to end stage renal disease caused by type 2 diabetes mellitus, on dialysis ([place] Stable.  Not on medication for diabetes. -Resume nightly clonidine, atenolol - Monitor blood sugars daily  Multiple myeloma ([place] Follows with Dr. KDelton Coombes  Per last note 08/2021, patient is on Velcade every 2 weeks.   DVT prophylaxis:  heparin Code Status:  DNR-Universal DNR form at bedside Family Communication: None at bedside Disposition Plan: ~ 2 days Consults called: gen Surg,  Nephrology Admission status: Obs tele    EBethena RoysMD [facility]  10/01/2021, 7:35 PM

## 2021-10-01 NOTE — Consult Note (Signed)
Reason for Consult: Altered mental status Referring Physician: Dr. Georgana Curio is an 86 y.o. female.  HPI: Is an 86 year old black female with multiple medical problems including end-stage renal disease, dementia, diabetes mellitus, right breast cancer, multiple myeloma who was sent to the emergency room by the [facility] facility due to altered mental status.  Apparently they were concerned about stroke as the patient was vomiting and was apparently unresponsive.  The PA at the facility did not note any deficits or sign of a stroke.  She was sent to the emergency room for further evaluation and treatment.  Her laboratory tests reveal transaminitis with evidence of cholelithiasis on ultrasound.  The ultrasound of the right upper quadrant did not suggest acute cholecystitis.  Patient states she has had several episodes of nausea and vomiting.  She seems to be somewhat a poor historian, thus it was difficult to elicit an accurate history.  She denies any abdominal pain.  Past Medical History:  Diagnosis Date   Anemia     chronic macrocytic anemia   Anxiety    Chronic kidney disease    Chronic renal disease, stage 4, severely decreased glomerular filtration rate (GFR) between 15-29 mL/min/1.73 square meter ([place]) 62/95/2841   Complication of anesthesia    delirious after Breast Surgery   Dementia ([place])    mild   Depression    Diabetes mellitus with ESRD (end-stage renal disease) ([place])    type II   Dysphagia    Dyspnea    with activity   GERD (gastroesophageal reflux disease)    Glaucoma    Hyperlipidemia    Hypertension    Pneumonia    Stage 1 infiltrating ductal carcinoma of right female breast ([place]) 08/21/2015   ER+ PR+ HER 2 neu + (3+) T1cN0     Past Surgical History:  Procedure Laterality Date   A/V FISTULAGRAM Right 07/05/2020   Procedure: A/V Fistulagram;  Surgeon: Elam Dutch, MD;  Location: [facility];  Service: Cardiovascular;  Laterality:  Right;   AV FISTULA PLACEMENT Left 11/22/2017   Procedure: ARTERIOVENOUS (AV) FISTULA CREATION LEFT ARM;  Surgeon: Elam Dutch, MD;  Location[facility] OR;  Service: Vascular;  Laterality: Left;   AV FISTULA PLACEMENT Right 04/04/2020   Procedure: RIGHT ARM ARTERIOVENOUS FISTULA CREATION;  Surgeon: Rosetta Posner, MD;  Location: [facility];  Service: Vascular;  Laterality: Right;   AV FISTULA PLACEMENT Right 08/20/2020   Procedure: ARTERIOVENOUS (AV) FISTULA LIGATION RIGHT ARM;  Surgeon: Elam Dutch, MD;  Location: [facility] OR;  Service: Vascular;  Laterality: Right;   BIOPSY  08/07/2016   Procedure: BIOPSY;  Surgeon: Daneil Dolin, MD;  [facility];  Service: Endoscopy;;  gastric ulcer biopsy   COLONOSCOPY     ESOPHAGOGASTRODUODENOSCOPY N/A 08/07/2016   LA Grade A esophagitis s/p dilation, small hiatal hernia, multiple gastric ulcers and erosions, duodenal erosions s/p biopsy. Negative H.pylori    ESOPHAGOGASTRODUODENOSCOPY N/A 11/27/2016   normal esophagus, previously noted gastric ulcers completely healed, normal duodenum.    ESOPHAGOGASTRODUODENOSCOPY (EGD) WITH PROPOFOL N/A 07/23/2020   Procedure: ESOPHAGOGASTRODUODENOSCOPY (EGD) WITH PROPOFOL;  Surgeon: Rogene Houston, MD;  [facility];  Service: Endoscopy;  Laterality: N/A;   FISTULA SUPERFICIALIZATION Left 02/14/2018   Procedure: FISTULA SUPERFICIALIZATION LEFT ARM;  Surgeon: Angelia Mould, MD;  Location[facility] OR;  Service: Vascular;  Laterality: Left;   FRACTURE SURGERY Right    ankle   HOT HEMOSTASIS  07/23/2020  Procedure: HOT HEMOSTASIS (ARGON PLASMA COAGULATION/BICAP);  Surgeon: Rogene Houston, MD;  [facility];  Service: Endoscopy;;   INTRAMEDULLARY (IM) NAIL INTERTROCHANTERIC Right 07/12/2020   Procedure: INTRAMEDULLARY (IM) NAIL INTERTROCHANTRIC;  Surgeon: Mordecai Rasmussen, MD;  Location: [facility];  Service: Orthopedics;  Laterality: Right;   MALONEY DILATION N/A 08/07/2016   Procedure: Venia Minks  DILATION;  Surgeon: Daneil Dolin, MD;  [facility];  Service: Endoscopy;  Laterality: N/A;   MASTECTOMY, PARTIAL Right    multiple myeloma     PERIPHERAL VASCULAR BALLOON ANGIOPLASTY Left 07/13/2019   Procedure: PERIPHERAL VASCULAR BALLOON ANGIOPLASTY;  Surgeon: Marty Heck, MD;  Location: [place] [facility];  Service: Cardiovascular;  Laterality: Left;  arm fistulogram   PERIPHERAL VASCULAR BALLOON ANGIOPLASTY Right 05/22/2020   Procedure: PERIPHERAL VASCULAR BALLOON ANGIOPLASTY;  Surgeon: Cherre Robins, MD;  Location: [place] [facility];  Service: Cardiovascular;  Laterality: Right;  arm fistula   PERIPHERAL VASCULAR BALLOON ANGIOPLASTY Right 07/05/2020   Procedure: PERIPHERAL VASCULAR BALLOON ANGIOPLASTY;  Surgeon: Elam Dutch, MD;  Location: [place] [facility];  Service: Cardiovascular;  Laterality: Right;  arm fistula   PORT-A-CATH REMOVAL Left 11/22/2017   Procedure: REMOVAL PORT-A-CATH LEFT CHEST;  Surgeon: Elam Dutch, MD;  Location: [place];  Service: Vascular;  Laterality: Left;   RETINAL DETACHMENT SURGERY Right    SCLEROTHERAPY  07/23/2020   Procedure: SCLEROTHERAPY;  Surgeon: Rogene Houston, MD;  [facility];  Service: Endoscopy;;   UPPER EXTREMITY VENOGRAPHY N/A 08/22/2020   Procedure: LEFT UPPER & CENTRAL VENOGRAPHY;  Surgeon: Marty Heck, MD;  Location: [place] [facility];  Service: Cardiovascular;  Laterality: N/A;    Family History  Problem Relation Age of Onset   Multiple myeloma Sister    Brain cancer Sister    Dementia Mother        died at 69   Stroke Mother    Heart failure Mother    Diabetes Mother    Heart disease Father    Prostate cancer Brother    Colon cancer Neg Hx     Social History:  reports that she has never smoked. She has never used smokeless tobacco. She reports that she does not drink alcohol and does not use drugs.  Allergies:  Allergies  Allergen Reactions   Ace Inhibitors Other (See  Comments) and Cough    Tongue swell , ie angioedema   Angiotensin Receptor Blockers     Angioedema with ACE-I   Penicillins Other (See Comments)    Unsure of reaction Has patient had a PCN reaction causing immediate rash, facial/tongue/throat swelling, SOB or lightheadedness with hypotension: Unknown Has patient had a PCN reaction causing severe rash involving mucus membranes or skin necrosis: Unknown Has patient had a PCN reaction that required hospitalization: No Has patient had a PCN reaction occurring within the last 10 years: Unknown If all of the above answers are "NO", then may proceed with Cephalosporin use.     Penicillin G     Medications: Prior to Admission: (Not in a hospital admission)   Results for orders placed or performed during the hospital encounter of 10/01/21 (from the past 48 hour(s))  Comprehensive metabolic panel     Status: Abnormal   Collection Time: 10/01/21 10:32 AM  Result Value Ref Range   Sodium 135 135 - 145 mmol/L   Potassium 3.7 3.5 - 5.1 mmol/L   Chloride 95 (L) 98 - 111 mmol/L  CO2 23 22 - 32 mmol/L   Glucose, Bld 198 (H) 70 - 99 mg/dL    Comment: Glucose reference range applies only to samples taken after fasting for at least 8 hours.   BUN 59 (H) 8 - 23 mg/dL   Creatinine, Ser 9.01 (H) 0.44 - 1.00 mg/dL   Calcium 9.6 8.9 - 10.3 mg/dL   Total Protein 6.7 6.5 - 8.1 g/dL   Albumin 3.6 3.5 - 5.0 g/dL   AST 212 (H) 15 - 41 U/L   ALT 173 (H) 0 - 44 U/L   Alkaline Phosphatase 120 38 - 126 U/L   Total Bilirubin 0.7 0.3 - 1.2 mg/dL   GFR, Estimated 4 (L) >60 mL/min    Comment: (NOTE) Calculated using the CKD-EPI Creatinine Equation (2021)    Anion gap 17 (H) 5 - 15    Comment: Performed at [facility], [address], [place], [place] [address]  Lipase, blood     Status: Abnormal   Collection Time: 10/01/21 10:32 AM  Result Value Ref Range   Lipase 74 (H) 11 - 51 U/L    Comment: Performed at [facility], [address], [place], [place]  [address]  CBC with Differential     Status: Abnormal   Collection Time: 10/01/21 10:32 AM  Result Value Ref Range   WBC 13.0 (H) 4.0 - 10.5 K/uL   RBC 2.90 (L) 3.87 - 5.11 MIL/uL   Hemoglobin 9.1 (L) 12.0 - 15.0 g/dL   HCT 28.8 (L) 36.0 - 46.0 %   MCV 99.3 80.0 - 100.0 fL   MCH 31.4 26.0 - 34.0 pg   MCHC 31.6 30.0 - 36.0 g/dL   RDW 16.5 (H) 11.5 - 15.5 %   Platelets 251 150 - 400 K/uL   nRBC 0.2 0.0 - 0.2 %   Neutrophils Relative % 88 %   Neutro Abs 11.6 (H) 1.7 - 7.7 K/uL   Lymphocytes Relative 6 %   Lymphs Abs 0.7 0.7 - 4.0 K/uL   Monocytes Relative 5 %   Monocytes Absolute 0.6 0.1 - 1.0 K/uL   Eosinophils Relative 0 %   Eosinophils Absolute 0.0 0.0 - 0.5 K/uL   Basophils Relative 0 %   Basophils Absolute 0.0 0.0 - 0.1 K/uL   Immature Granulocytes 1 %   Abs Immature Granulocytes 0.07 0.00 - 0.07 K/uL    Comment: Performed at [facility], [address], [place], [place] [address]  Magnesium     Status: None   Collection Time: 10/01/21 10:32 AM  Result Value Ref Range   Magnesium 2.2 1.7 - 2.4 mg/dL    Comment: Performed at [facility], [address], [place], [place] [address]    CT CHEST ABDOMEN PELVIS W CONTRAST  Result Date: 10/01/2021 CLINICAL DATA:  Concern for aspiration pneumonia. Abnormal liver labs. Confusion. EXAM: CT CHEST, ABDOMEN, AND PELVIS WITH CONTRAST TECHNIQUE: Multidetector CT imaging of the chest, abdomen and pelvis was performed following the standard protocol during bolus administration of intravenous contrast. RADIATION DOSE REDUCTION: This exam was performed according to the departmental dose-optimization program which includes automated exposure control, adjustment of the mA and/or kV according to patient size and/or use of iterative reconstruction technique. CONTRAST:  137m OMNIPAQUE IOHEXOL 300 MG/ML  SOLN COMPARISON:  Ultrasound 10/01/2021.  Radiography 10/01/2021. FINDINGS: CT CHEST FINDINGS Cardiovascular: Heart size is normal. Extensive coronary  artery calcification. Aortic atherosclerotic calcification. Aortic valve region calcification. Mediastinum/Nodes: No mediastinal or hilar mass or lymphadenopathy. Small hiatal hernia.  Lungs/Pleura: The lung parenchyma is clear. No infiltrate or collapse. Small area of scarring in the medial left lower lobe. Musculoskeletal: Curvature in chronic degenerative change of the spine. Bridging thoracic osteophytes. Acute appearing superior endplate fracture at Z61, L1 and L2. CT ABDOMEN PELVIS FINDINGS Hepatobiliary: Liver parenchyma is normal. Multiple stones within the gallbladder. No CT sign of cholecystitis or obstruction. Pancreas: 1 x 2 cm cystic abnormality in the ventral head of the pancreas. Spleen: Normal Adrenals/Urinary Tract: Adrenal glands are normal. Renal cortical atrophy with multiple cysts. Extensive renovascular calcification including a 1 cm aneurysm in the right renal hilum. Nonobstructing 5 mm stone in the lower pole of the left kidney. Exophytic abnormality at the inferior pole of the right kidney is indeterminate for cyst versus mass, measuring up to 2.7 cm in size. Bladder is normal. Stomach/Bowel: Small hiatal hernia. Stomach otherwise normal. Small bowel pattern is normal. No sign of appendicitis. Mild diverticulosis of the colon without visible diverticulitis. Vascular/Lymphatic: Aortic atherosclerosis. No aneurysm. IVC is normal. No adenopathy. Reproductive: Multiple calcified leiomyomas of the uterus. No other pelvic masses seen. Other: No free fluid or air. Musculoskeletal: As noted above, probably acute superior endplate fractures at W96, L1 and L2. Lower lumbar degenerative changes with anterolisthesis at L4-5 and probable stenosis at this level. Previous gamma nail fixation of a right femur fracture. Probable Paget's disease within the right hemipelvis. IMPRESSION: No aspiration pneumonia. Mild focal scarring at the medial left lung base. Coronary artery calcification. Aortic  atherosclerotic calcification. Aortic Atherosclerosis (ICD10-I70.0). Probably acute superior endplate fractures at E45, L1 and L2. No retropulsed bone. Renal atrophy. Multiple renal cysts. Indeterminate 2.7 cm lesion at the lower pole the right kidney could be a complicated cyst or a mass. This could be evaluated with renal MRI. 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 Nonobstructing 5 mm stone in the lower pole of the left kidney. 1 x 2 cm cystic abnormality in the ventral head of the pancreas. Best practice recommendation is for reimaging of this every 6 months for 2 years and then every 1 year for 2 years and then every 2 years for 6 years. Follow-up can be discontinued if stable for 10 years. This recommendation could be tailored to this patient's clinical status and life expectancy. Paget's disease of the right hemipelvis. Diverticulosis without evidence of diverticulitis. Small hiatal hernia. Multiple calcified leiomyomas the uterus. Electronically Signed   By: Nelson Chimes M.D.   On: 10/01/2021 14:44   US Abdomen Limited RUQ (LIVER/GB)  Result Date: 10/01/2021 CLINICAL DATA:  Vomiting, found unresponsive EXAM: ULTRASOUND ABDOMEN LIMITED RIGHT UPPER QUADRANT COMPARISON:  None Available. FINDINGS: Gallbladder: No wall thickening. Gallstones are present, largest measures 2.4 cm. No sonographic Murphy sign noted by sonographer. Common bile duct: Diameter: 2.4 mm Liver: No focal lesion identified. Increased parenchymal echogenicity. Portal vein is patent on color Doppler imaging with normal direction of blood flow towards the liver. Other: Evaluation somewhat limited due to patient positioning. IMPRESSION: 1. Cholelithiasis with no evidence of acute cholecystitis. 2. Increased echogenicity of the liver parenchyma, findings suggestive of hepatic steatosis. Electronically Signed   By: Yetta Glassman M.D.   On: 10/01/2021 12:08   DG Chest Portable 1 View  Result Date: 10/01/2021 CLINICAL DATA:  Possible  aspiration. EXAM: PORTABLE CHEST 1 VIEW COMPARISON:  Radiographs 07/23/2020 and 07/14/2020. FINDINGS: 0957 hours. Left IJ hemodialysis catheter projects to the level of the upper right atrium. The heart size and mediastinal contours are stable with aortic atherosclerosis. There  is mild atelectasis or scarring at both lung bases, but no confluent airspace opacity, edema, pneumothorax or significant pleural effusion. Small surgical clips are present in the right axilla. No acute osseous findings are evident. Telemetry leads overlie the chest. IMPRESSION: No evidence of active cardiopulmonary process. Electronically Signed   By: Richardean Sale M.D.   On: 10/01/2021 10:10    ROS:  Review of systems not obtained due to patient factors.  Blood pressure 128/64, pulse 70, temperature 98.5 F (36.9 C), temperature source Oral, resp. rate 18, height 5' 3" (1.6 m), weight 61.1 kg, SpO2 94 %. Physical Exam: Pleasant black female no acute distress Head is normocephalic, atraumatic Eyes are without scleral icterus A permacath is in place left upper chest. Abdomen is soft, nontender, nondistended.  No hepatosplenomegaly, masses, or hernias identified.  Labs, ultrasound, and CT scan results all reviewed  Assessment/Plan: Impression: Transaminitis of unknown etiology.  She does have evidence of cholelithiasis.  Her laboratory tests were unremarkable just 24 hours earlier.  It is noted that her lipase is elevated, though this could be secondary to her end-stage renal disease.  She is due for dialysis today.  Clinically, she has no abdominal pain or evidence of acute cholecystitis. Plan: No need for acute surgical invention at this time.  She may have a renal diet.  She will undergo dialysis later today.  We will check her LFTs tomorrow in a.m.  Further management is pending those results.  Aviva Signs 10/01/2021, 4:42 PM

## 2021-10-01 NOTE — Assessment & Plan Note (Signed)
Resume sertraline,

## 2021-10-02 ENCOUNTER — Telehealth: Payer: Self-pay

## 2021-10-02 ENCOUNTER — Inpatient Hospital Stay (HOSPITAL_BASED_OUTPATIENT_CLINIC_OR_DEPARTMENT_OTHER): Payer: Medicare PPO

## 2021-10-02 DIAGNOSIS — C9000 Multiple myeloma not having achieved remission: Secondary | ICD-10-CM

## 2021-10-02 DIAGNOSIS — Z95820 Peripheral vascular angioplasty status with implants and grafts: Secondary | ICD-10-CM | POA: Diagnosis not present

## 2021-10-02 DIAGNOSIS — N250 Renal osteodystrophy: Secondary | ICD-10-CM | POA: Diagnosis not present

## 2021-10-02 DIAGNOSIS — I120 Hypertensive chronic kidney disease with stage 5 chronic kidney disease or end stage renal disease: Secondary | ICD-10-CM | POA: Diagnosis not present

## 2021-10-02 DIAGNOSIS — F0390 Unspecified dementia without behavioral disturbance: Secondary | ICD-10-CM | POA: Diagnosis not present

## 2021-10-02 DIAGNOSIS — Z992 Dependence on renal dialysis: Secondary | ICD-10-CM | POA: Diagnosis not present

## 2021-10-02 DIAGNOSIS — K8020 Calculus of gallbladder without cholecystitis without obstruction: Secondary | ICD-10-CM | POA: Diagnosis not present

## 2021-10-02 DIAGNOSIS — N186 End stage renal disease: Secondary | ICD-10-CM | POA: Diagnosis not present

## 2021-10-02 DIAGNOSIS — R55 Syncope and collapse: Secondary | ICD-10-CM | POA: Diagnosis not present

## 2021-10-02 DIAGNOSIS — R748 Abnormal levels of other serum enzymes: Secondary | ICD-10-CM | POA: Diagnosis not present

## 2021-10-02 DIAGNOSIS — Z79899 Other long term (current) drug therapy: Secondary | ICD-10-CM | POA: Diagnosis not present

## 2021-10-02 DIAGNOSIS — E1122 Type 2 diabetes mellitus with diabetic chronic kidney disease: Secondary | ICD-10-CM | POA: Diagnosis not present

## 2021-10-02 DIAGNOSIS — C50911 Malignant neoplasm of unspecified site of right female breast: Secondary | ICD-10-CM | POA: Diagnosis not present

## 2021-10-02 DIAGNOSIS — D631 Anemia in chronic kidney disease: Secondary | ICD-10-CM | POA: Diagnosis not present

## 2021-10-02 LAB — CBC
HCT: 27.1 % — ABNORMAL LOW (ref 36.0–46.0)
HCT: 26.5 % — ABNORMAL LOW (ref 36.0–46.0)
Hemoglobin: 8.6 g/dL — ABNORMAL LOW (ref 12.0–15.0)
Hemoglobin: 8.3 g/dL — ABNORMAL LOW (ref 12.0–15.0)
MCH: 31.7 pg (ref 26.0–34.0)
MCH: 31.4 pg (ref 26.0–34.0)
MCHC: 31.7 g/dL (ref 30.0–36.0)
MCHC: 31.3 g/dL (ref 30.0–36.0)
MCV: 100 fL (ref 80.0–100.0)
MCV: 100.4 fL — ABNORMAL HIGH (ref 80.0–100.0)
Platelets: 180 10*3/uL (ref 150–400)
Platelets: 191 10*3/uL (ref 150–400)
RBC: 2.71 MIL/uL — ABNORMAL LOW (ref 3.87–5.11)
RBC: 2.64 MIL/uL — ABNORMAL LOW (ref 3.87–5.11)
RDW: 16.6 % — ABNORMAL HIGH (ref 11.5–15.5)
RDW: 16.9 % — ABNORMAL HIGH (ref 11.5–15.5)
WBC: 7.8 10*3/uL (ref 4.0–10.5)
WBC: 7.8 10*3/uL (ref 4.0–10.5)
nRBC: 0 % (ref 0.0–0.2)
nRBC: 0 % (ref 0.0–0.2)

## 2021-10-02 LAB — COMPREHENSIVE METABOLIC PANEL
ALT: 116 U/L — ABNORMAL HIGH (ref 0–44)
AST: 66 U/L — ABNORMAL HIGH (ref 15–41)
Albumin: 3.4 g/dL — ABNORMAL LOW (ref 3.5–5.0)
Alkaline Phosphatase: 103 U/L (ref 38–126)
Anion gap: 15 (ref 5–15)
BUN: 66 mg/dL — ABNORMAL HIGH (ref 8–23)
CO2: 25 mmol/L (ref 22–32)
Calcium: 9.2 mg/dL (ref 8.9–10.3)
Chloride: 97 mmol/L — ABNORMAL LOW (ref 98–111)
Creatinine, Ser: 10.58 mg/dL — ABNORMAL HIGH (ref 0.44–1.00)
GFR, Estimated: 3 mL/min — ABNORMAL LOW (ref >60)
Glucose, Bld: 86 mg/dL (ref 70–99)
Potassium: 3.9 mmol/L (ref 3.5–5.1)
Sodium: 137 mmol/L (ref 135–145)
Total Bilirubin: 0.6 mg/dL (ref 0.3–1.2)
Total Protein: 6.1 g/dL — ABNORMAL LOW (ref 6.5–8.1)

## 2021-10-02 LAB — RENAL FUNCTION PANEL
Albumin: 3.4 g/dL — ABNORMAL LOW (ref 3.5–5.0)
Anion gap: 14 (ref 5–15)
BUN: 69 mg/dL — ABNORMAL HIGH (ref 8–23)
CO2: 24 mmol/L (ref 22–32)
Calcium: 8.9 mg/dL (ref 8.9–10.3)
Chloride: 96 mmol/L — ABNORMAL LOW (ref 98–111)
Creatinine, Ser: 11.15 mg/dL — ABNORMAL HIGH (ref 0.44–1.00)
GFR, Estimated: 3 mL/min — ABNORMAL LOW (ref >60)
Glucose, Bld: 167 mg/dL — ABNORMAL HIGH (ref 70–99)
Phosphorus: 3.6 mg/dL (ref 2.5–4.6)
Potassium: 4 mmol/L (ref 3.5–5.1)
Sodium: 134 mmol/L — ABNORMAL LOW (ref 135–145)

## 2021-10-02 LAB — ECHOCARDIOGRAM COMPLETE
AR max vel: 1.91 cm2
AV Area VTI: 1.91 cm2
AV Area mean vel: 1.85 cm2
AV Mean grad: 7.5 mmHg
AV Peak grad: 14.9 mmHg
Ao pk vel: 1.93 m/s
Area-P 1/2: 1.95 cm2
Height: 63 in
MV VTI: 2.81 cm2
S' Lateral: 2.6 cm
Weight: 2264.57 oz

## 2021-10-02 LAB — HEPATITIS B SURFACE ANTIGEN: Hepatitis B Surface Ag: NONREACTIVE

## 2021-10-02 LAB — HEPATITIS B CORE ANTIBODY, TOTAL: Hep B Core Total Ab: NONREACTIVE

## 2021-10-02 LAB — HEPATITIS PANEL, ACUTE
HCV Ab: NONREACTIVE
Hep A IgM: NONREACTIVE
Hep B C IgM: NONREACTIVE
Hepatitis B Surface Ag: NONREACTIVE

## 2021-10-02 LAB — HEPATITIS C ANTIBODY: HCV Ab: NONREACTIVE

## 2021-10-02 LAB — GLUCOSE, CAPILLARY: Glucose-Capillary: 88 mg/dL (ref 70–99)

## 2021-10-02 LAB — TROPONIN I (HIGH SENSITIVITY): Troponin I (High Sensitivity): 100 ng/L (ref <18)

## 2021-10-02 LAB — HEPATITIS B SURFACE ANTIBODY,QUALITATIVE: Hep B S Ab: NONREACTIVE

## 2021-10-02 MED ORDER — HEPARIN SODIUM (PORCINE) 1000 UNIT/ML IJ SOLN
INTRAMUSCULAR | Status: AC
  Administered 2021-10-02: 1000 [IU]
  Filled 2021-10-02: qty 4

## 2021-10-02 MED ORDER — LIDOCAINE HCL (PF) 1 % IJ SOLN: 5.0000 mL | INTRAMUSCULAR | Status: DC | PRN

## 2021-10-02 MED ORDER — SERTRALINE HCL 50 MG PO TABS
75.0000 mg | ORAL_TABLET | Freq: Every day | ORAL | Status: DC
  Administered 2021-10-03: 75 mg via ORAL
  Filled 2021-10-02: qty 2

## 2021-10-02 MED ORDER — CHLORHEXIDINE GLUCONATE CLOTH 2 % EX PADS
6.0000 | MEDICATED_PAD | Freq: Every day | CUTANEOUS | Status: DC
  Administered 2021-10-02 – 2021-10-03 (×2): 6 via TOPICAL

## 2021-10-02 MED ORDER — ANTICOAGULANT SODIUM CITRATE 4% (200MG/5ML) IV SOLN: 5.0000 mL | Status: DC | PRN

## 2021-10-02 MED ORDER — LIDOCAINE-PRILOCAINE 2.5-2.5 % EX CREA: 1.0000 | TOPICAL_CREAM | CUTANEOUS | Status: DC | PRN

## 2021-10-02 MED ORDER — PENTAFLUOROPROP-TETRAFLUOROETH EX AERO: 1.0000 | INHALATION_SPRAY | CUTANEOUS | Status: DC | PRN

## 2021-10-02 MED ORDER — ALTEPLASE 2 MG IJ SOLR: 2.0000 mg | Freq: Once | INTRAMUSCULAR | Status: DC | PRN

## 2021-10-02 MED ORDER — HEPARIN SODIUM (PORCINE) 1000 UNIT/ML DIALYSIS: 1000.0000 [IU] | INTRAMUSCULAR | Status: DC | PRN

## 2021-10-02 NOTE — Progress Notes (Signed)
Patient was hungry and given a bag lunch after Diet orders obtained. Patient rested the rest of the shift only waking to have vitals and take medications.

## 2021-10-02 NOTE — Consult Note (Signed)
Reason for Consult: To manage dialysis and dialysis related needs Referring Physician: Dr Dustin Folks is an 86 y.o. female.  HPI: Pt is an 34F with a PMH sig for HTN, HLD, DM II, multiple myeloma, dementia, ESRD on HD MWF, and breast cancer who is now seen in consultation at the request of Dr Denton Brick for evaluation and recommendations surrounding ESRD and provision of dialysis.    Pt apparently had a period of vomiting and unresponsiveness yesterday.  EMS called and was brought to ED.  Pt had to be suctioned to clear the airway- some concern for aspiration.  Workup in the ED was unremarkable except for elevated LFTs, RUQ [place] with cholelithiasis without cholecystitis- trending liver enzymes recommended.    In this setting we are asked to see.    Pt doesn't remember passing out or vomiting.  She is feeling well now.  She ate all her breakfast on her tray.    Dialyzes at [facility] MWF  Past Medical History:  Diagnosis Date   Anemia     chronic macrocytic anemia   Anxiety    Chronic kidney disease    Chronic renal disease, stage 4, severely decreased glomerular filtration rate (GFR) between 15-29 mL/min/1.73 square meter (HCC) 96/28/3662   Complication of anesthesia    delirious after Breast Surgery   Dementia ([place])    mild   Depression    Diabetes mellitus with ESRD (end-stage renal disease) ([place])    type II   Dysphagia    Dyspnea    with activity   GERD (gastroesophageal reflux disease)    Glaucoma    Hyperlipidemia    Hypertension    Pneumonia    Stage 1 infiltrating ductal carcinoma of right female breast ([place]) 08/21/2015   ER+ PR+ HER 2 neu + (3+) T1cN0     Past Surgical History:  Procedure Laterality Date   A/V FISTULAGRAM Right 07/05/2020   Procedure: A/V Fistulagram;  Surgeon: Elam Dutch, MD;  Location: [place] [facility];  Service: Cardiovascular;  Laterality: Right;   AV FISTULA PLACEMENT Left 11/22/2017   Procedure: ARTERIOVENOUS (AV) FISTULA  CREATION LEFT ARM;  Surgeon: Elam Dutch, MD;  Location: [facility];  Service: Vascular;  Laterality: Left;   AV FISTULA PLACEMENT Right 04/04/2020   Procedure: RIGHT ARM ARTERIOVENOUS FISTULA CREATION;  Surgeon: Rosetta Posner, MD;  [facility];  Service: Vascular;  Laterality: Right;   AV FISTULA PLACEMENT Right 08/20/2020   Procedure: ARTERIOVENOUS (AV) FISTULA LIGATION RIGHT ARM;  Surgeon: Elam Dutch, MD;  Location: [facility] OR;  Service: Vascular;  Laterality: Right;   BIOPSY  08/07/2016   Procedure: BIOPSY;  Surgeon: Daneil Dolin, MD;  [facility];  Service: Endoscopy;;  gastric ulcer biopsy   COLONOSCOPY     ESOPHAGOGASTRODUODENOSCOPY N/A 08/07/2016   LA Grade A esophagitis s/p dilation, small hiatal hernia, multiple gastric ulcers and erosions, duodenal erosions s/p biopsy. Negative H.pylori    ESOPHAGOGASTRODUODENOSCOPY N/A 11/27/2016   normal esophagus, previously noted gastric ulcers completely healed, normal duodenum.    ESOPHAGOGASTRODUODENOSCOPY (EGD) WITH PROPOFOL N/A 07/23/2020   Procedure: ESOPHAGOGASTRODUODENOSCOPY (EGD) WITH PROPOFOL;  Surgeon: Rogene Houston, MD;  [facility];  Service: Endoscopy;  Laterality: N/A;   FISTULA SUPERFICIALIZATION Left 02/14/2018   Procedure: FISTULA SUPERFICIALIZATION LEFT ARM;  Surgeon: Angelia Mould, MD;  Location: [facility] OR;  Service: Vascular;  Laterality: Left;   FRACTURE SURGERY Right    ankle  HOT HEMOSTASIS  07/23/2020   Procedure: HOT HEMOSTASIS (ARGON PLASMA COAGULATION/BICAP);  Surgeon: Rogene Houston, MD;  [facility];  Service: Endoscopy;;   INTRAMEDULLARY (IM) NAIL INTERTROCHANTERIC Right 07/12/2020   Procedure: INTRAMEDULLARY (IM) NAIL INTERTROCHANTRIC;  Surgeon: Mordecai Rasmussen, MD;  [facility];  Service: Orthopedics;  Laterality: Right;   MALONEY DILATION N/A 08/07/2016   Procedure: Venia Minks DILATION;  Surgeon: Daneil Dolin, MD;  [facility];  Service: Endoscopy;   Laterality: N/A;   MASTECTOMY, PARTIAL Right    multiple myeloma     PERIPHERAL VASCULAR BALLOON ANGIOPLASTY Left 07/13/2019   Procedure: PERIPHERAL VASCULAR BALLOON ANGIOPLASTY;  Surgeon: Marty Heck, MD;  Location: [place] [facility];  Service: Cardiovascular;  Laterality: Left;  arm fistulogram   PERIPHERAL VASCULAR BALLOON ANGIOPLASTY Right 05/22/2020   Procedure: PERIPHERAL VASCULAR BALLOON ANGIOPLASTY;  Surgeon: Cherre Robins, MD;  Location: [place] [facility];  Service: Cardiovascular;  Laterality: Right;  arm fistula   PERIPHERAL VASCULAR BALLOON ANGIOPLASTY Right 07/05/2020   Procedure: PERIPHERAL VASCULAR BALLOON ANGIOPLASTY;  Surgeon: Elam Dutch, MD;  Location: [place] [facility];  Service: Cardiovascular;  Laterality: Right;  arm fistula   PORT-A-CATH REMOVAL Left 11/22/2017   Procedure: REMOVAL PORT-A-CATH LEFT CHEST;  Surgeon: Elam Dutch, MD;  Location: [place];  Service: Vascular;  Laterality: Left;   RETINAL DETACHMENT SURGERY Right    SCLEROTHERAPY  07/23/2020   Procedure: SCLEROTHERAPY;  Surgeon: Rogene Houston, MD;  [facility];  Service: Endoscopy;;   UPPER EXTREMITY VENOGRAPHY N/A 08/22/2020   Procedure: LEFT UPPER & CENTRAL VENOGRAPHY;  Surgeon: Marty Heck, MD;  Location: [place] [facility];  Service: Cardiovascular;  Laterality: N/A;    Family History  Problem Relation Age of Onset   Multiple myeloma Sister    Brain cancer Sister    Dementia Mother        died at 80   Stroke Mother    Heart failure Mother    Diabetes Mother    Heart disease Father    Prostate cancer Brother    Colon cancer Neg Hx     Social History:  reports that she has never smoked. She has never used smokeless tobacco. She reports that she does not drink alcohol and does not use drugs.  Allergies:  Allergies  Allergen Reactions   Ace Inhibitors Other (See Comments) and Cough    Tongue swell , ie angioedema   Angiotensin Receptor Blockers      Angioedema with ACE-I   Penicillins Other (See Comments)    Unsure of reaction Has patient had a PCN reaction causing immediate rash, facial/tongue/throat swelling, SOB or lightheadedness with hypotension: Unknown Has patient had a PCN reaction causing severe rash involving mucus membranes or skin necrosis: Unknown Has patient had a PCN reaction that required hospitalization: No Has patient had a PCN reaction occurring within the last 10 years: Unknown If all of the above answers are "NO", then may proceed with Cephalosporin use.     Penicillin G     Medications: Scheduled:  acyclovir  200 mg Oral Daily   anastrozole  1 mg Oral Daily   atenolol  25 mg Oral Daily   Chlorhexidine Gluconate Cloth  6 each Topical Daily   Chlorhexidine Gluconate Cloth  6 each Topical Q0600   cloNIDine  0.1 mg Oral QHS   heparin  5,000 Units Subcutaneous Q8H   melatonin  6 mg Oral QHS  pantoprazole  40 mg Oral BID   sertraline  75 mg Oral Daily     Results for orders placed or performed during the hospital encounter of 10/01/21 (from the past 48 hour(s))  Comprehensive metabolic panel     Status: Abnormal   Collection Time: 10/01/21 10:32 AM  Result Value Ref Range   Sodium 135 135 - 145 mmol/L   Potassium 3.7 3.5 - 5.1 mmol/L   Chloride 95 (L) 98 - 111 mmol/L   CO2 23 22 - 32 mmol/L   Glucose, Bld 198 (H) 70 - 99 mg/dL    Comment: Glucose reference range applies only to samples taken after fasting for at least 8 hours.   BUN 59 (H) 8 - 23 mg/dL   Creatinine, Ser 9.01 (H) 0.44 - 1.00 mg/dL   Calcium 9.6 8.9 - 10.3 mg/dL   Total Protein 6.7 6.5 - 8.1 g/dL   Albumin 3.6 3.5 - 5.0 g/dL   AST 212 (H) 15 - 41 U/L   ALT 173 (H) 0 - 44 U/L   Alkaline Phosphatase 120 38 - 126 U/L   Total Bilirubin 0.7 0.3 - 1.2 mg/dL   GFR, Estimated 4 (L) >60 mL/min    Comment: (NOTE) Calculated using the CKD-EPI Creatinine Equation (2021)    Anion gap 17 (H) 5 - 15    Comment: Performed at [facility],  [address] [place], [place] [address]  Lipase, blood     Status: Abnormal   Collection Time: 10/01/21 10:32 AM  Result Value Ref Range   Lipase 74 (H) 11 - 51 U/L    Comment: Performed at [facility], [address] [place], [place] [address]  CBC with Differential     Status: Abnormal   Collection Time: 10/01/21 10:32 AM  Result Value Ref Range   WBC 13.0 (H) 4.0 - 10.5 K/uL   RBC 2.90 (L) 3.87 - 5.11 MIL/uL   Hemoglobin 9.1 (L) 12.0 - 15.0 g/dL   HCT 28.8 (L) 36.0 - 46.0 %   MCV 99.3 80.0 - 100.0 fL   MCH 31.4 26.0 - 34.0 pg   MCHC 31.6 30.0 - 36.0 g/dL   RDW 16.5 (H) 11.5 - 15.5 %   Platelets 251 150 - 400 K/uL   nRBC 0.2 0.0 - 0.2 %   Neutrophils Relative % 88 %   Neutro Abs 11.6 (H) 1.7 - 7.7 K/uL   Lymphocytes Relative 6 %   Lymphs Abs 0.7 0.7 - 4.0 K/uL   Monocytes Relative 5 %   Monocytes Absolute 0.6 0.1 - 1.0 K/uL   Eosinophils Relative 0 %   Eosinophils Absolute 0.0 0.0 - 0.5 K/uL   Basophils Relative 0 %   Basophils Absolute 0.0 0.0 - 0.1 K/uL   Immature Granulocytes 1 %   Abs Immature Granulocytes 0.07 0.00 - 0.07 K/uL    Comment: Performed at [facility], [address] [place], [place] [address]  Magnesium     Status: None   Collection Time: 10/01/21 10:32 AM  Result Value Ref Range   Magnesium 2.2 1.7 - 2.4 mg/dL    Comment: Performed at [facility], [address], [place], [place] [address]  Comprehensive metabolic panel     Status: Abnormal   Collection Time: 10/02/21  6:15 AM  Result Value Ref Range   Sodium 137 135 - 145 mmol/L   Potassium 3.9 3.5 - 5.1 mmol/L   Chloride 97 (L) 98 - 111 mmol/L   CO2 25  22 - 32 mmol/L   Glucose, Bld 86 70 - 99 mg/dL    Comment: Glucose reference range applies only to samples taken after fasting for at least 8 hours.   BUN 66 (H) 8 - 23 mg/dL   Creatinine, Ser 10.58 (H) 0.44 - 1.00 mg/dL   Calcium 9.2 8.9 - 10.3 mg/dL   Total Protein 6.1 (L) 6.5 - 8.1 g/dL   Albumin 3.4 (L) 3.5 - 5.0 g/dL   AST 66 (H) 15 - 41 U/L    ALT 116 (H) 0 - 44 U/L   Alkaline Phosphatase 103 38 - 126 U/L   Total Bilirubin 0.6 0.3 - 1.2 mg/dL   GFR, Estimated 3 (L) >60 mL/min    Comment: (NOTE) Calculated using the CKD-EPI Creatinine Equation (2021)    Anion gap 15 5 - 15    Comment: Performed at [facility], [address], [name], [place] [address]  CBC     Status: Abnormal   Collection Time: 10/02/21  6:15 AM  Result Value Ref Range   WBC 7.8 4.0 - 10.5 K/uL   RBC 2.71 (L) 3.87 - 5.11 MIL/uL   Hemoglobin 8.6 (L) 12.0 - 15.0 g/dL   HCT 27.1 (L) 36.0 - 46.0 %   MCV 100.0 80.0 - 100.0 fL   MCH 31.7 26.0 - 34.0 pg   MCHC 31.7 30.0 - 36.0 g/dL   RDW 16.6 (H) 11.5 - 15.5 %   Platelets 180 150 - 400 K/uL   nRBC 0.0 0.0 - 0.2 %    Comment: Performed at [facility], [address], [place], [place] [address]  Troponin I (High Sensitivity)     Status: Abnormal   Collection Time: 10/02/21  6:15 AM  Result Value Ref Range   Troponin I (High Sensitivity) 100 (HH) <18 ng/L    Comment: CRITICAL RESULT CALLED TO, READ BACK BY AND VERIFIED WITH: LOPEZ,J AT 7:05AM ON 10/02/21 BY FESTERMAN,C (NOTE) Elevated high sensitivity troponin I (hsTnI) values and significant  changes across serial measurements may suggest ACS but many other  chronic and acute conditions are known to elevate hsTnI results.  Refer to the Links section for chest pain algorithms and additional  guidance. Performed at [facility], [address], [place], [place] [address]   Glucose, capillary     Status: None   Collection Time: 10/02/21  7:02 AM  Result Value Ref Range   Glucose-Capillary 88 70 - 99 mg/dL    Comment: Glucose reference range applies only to samples taken after fasting for at least 8 hours.    CT CHEST ABDOMEN PELVIS W CONTRAST  Result Date: 10/01/2021 CLINICAL DATA:  Concern for aspiration pneumonia. Abnormal liver labs. Confusion. EXAM: CT CHEST, ABDOMEN, AND PELVIS WITH CONTRAST TECHNIQUE: Multidetector CT imaging of the chest,  abdomen and pelvis was performed following the standard protocol during bolus administration of intravenous contrast. RADIATION DOSE REDUCTION: This exam was performed according to the departmental dose-optimization program which includes automated exposure control, adjustment of the mA and/or kV according to patient size and/or use of iterative reconstruction technique. CONTRAST:  129m OMNIPAQUE IOHEXOL 300 MG/ML  SOLN COMPARISON:  Ultrasound 10/01/2021.  Radiography 10/01/2021. FINDINGS: CT CHEST FINDINGS Cardiovascular: Heart size is normal. Extensive coronary artery calcification. Aortic atherosclerotic calcification. Aortic valve region calcification. Mediastinum/Nodes: No mediastinal or hilar mass or lymphadenopathy. Small hiatal hernia. Lungs/Pleura: The lung parenchyma is clear. No infiltrate or collapse. Small area of scarring in the medial left lower lobe. Musculoskeletal: Curvature in  chronic degenerative change of the spine. Bridging thoracic osteophytes. Acute appearing superior endplate fracture at H06, L1 and L2. CT ABDOMEN PELVIS FINDINGS Hepatobiliary: Liver parenchyma is normal. Multiple stones within the gallbladder. No CT sign of cholecystitis or obstruction. Pancreas: 1 x 2 cm cystic abnormality in the ventral head of the pancreas. Spleen: Normal Adrenals/Urinary Tract: Adrenal glands are normal. Renal cortical atrophy with multiple cysts. Extensive renovascular calcification including a 1 cm aneurysm in the right renal hilum. Nonobstructing 5 mm stone in the lower pole of the left kidney. Exophytic abnormality at the inferior pole of the right kidney is indeterminate for cyst versus mass, measuring up to 2.7 cm in size. Bladder is normal. Stomach/Bowel: Small hiatal hernia. Stomach otherwise normal. Small bowel pattern is normal. No sign of appendicitis. Mild diverticulosis of the colon without visible diverticulitis. Vascular/Lymphatic: Aortic atherosclerosis. No aneurysm. IVC is normal. No  adenopathy. Reproductive: Multiple calcified leiomyomas of the uterus. No other pelvic masses seen. Other: No free fluid or air. Musculoskeletal: As noted above, probably acute superior endplate fractures at C37, L1 and L2. Lower lumbar degenerative changes with anterolisthesis at L4-5 and probable stenosis at this level. Previous gamma nail fixation of a right femur fracture. Probable Paget's disease within the right hemipelvis. IMPRESSION: No aspiration pneumonia. Mild focal scarring at the medial left lung base. Coronary artery calcification. Aortic atherosclerotic calcification. Aortic Atherosclerosis (ICD10-I70.0). Probably acute superior endplate fractures at S28, L1 and L2. No retropulsed bone. Renal atrophy. Multiple renal cysts. Indeterminate 2.7 cm lesion at the lower pole the right kidney could be a complicated cyst or a mass. This could be evaluated with renal MRI. 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 Nonobstructing 5 mm stone in the lower pole of the left kidney. 1 x 2 cm cystic abnormality in the ventral head of the pancreas. Best practice recommendation is for reimaging of this every 6 months for 2 years and then every 1 year for 2 years and then every 2 years for 6 years. Follow-up can be discontinued if stable for 10 years. This recommendation could be tailored to this patient's clinical status and life expectancy. Paget's disease of the right hemipelvis. Diverticulosis without evidence of diverticulitis. Small hiatal hernia. Multiple calcified leiomyomas the uterus. Electronically Signed   By: Nelson Chimes M.D.   On: 10/01/2021 14:44   US Abdomen Limited RUQ (LIVER/GB)  Result Date: 10/01/2021 CLINICAL DATA:  Vomiting, found unresponsive EXAM: ULTRASOUND ABDOMEN LIMITED RIGHT UPPER QUADRANT COMPARISON:  None Available. FINDINGS: Gallbladder: No wall thickening. Gallstones are present, largest measures 2.4 cm. No sonographic Murphy sign noted by sonographer. Common bile duct: Diameter: 2.4  mm Liver: No focal lesion identified. Increased parenchymal echogenicity. Portal vein is patent on color Doppler imaging with normal direction of blood flow towards the liver. Other: Evaluation somewhat limited due to patient positioning. IMPRESSION: 1. Cholelithiasis with no evidence of acute cholecystitis. 2. Increased echogenicity of the liver parenchyma, findings suggestive of hepatic steatosis. Electronically Signed   By: Yetta Glassman M.D.   On: 10/01/2021 12:08   DG Chest Portable 1 View  Result Date: 10/01/2021 CLINICAL DATA:  Possible aspiration. EXAM: PORTABLE CHEST 1 VIEW COMPARISON:  Radiographs 07/23/2020 and 07/14/2020. FINDINGS: 0957 hours. Left IJ hemodialysis catheter projects to the level of the upper right atrium. The heart size and mediastinal contours are stable with aortic atherosclerosis. There is mild atelectasis or scarring at both lung bases, but no confluent airspace opacity, edema, pneumothorax or significant pleural effusion. Small surgical clips  are present in the right axilla. No acute osseous findings are evident. Telemetry leads overlie the chest. IMPRESSION: No evidence of active cardiopulmonary process. Electronically Signed   By: Richardean Sale M.D.   On: 10/01/2021 10:10    ROS: all other systems reviewed and are negative except as per HPI Blood pressure (!) 129/59, pulse (!) 51, temperature 97.7 F (36.5 C), temperature source Oral, resp. rate 20, height '5\' 3"'  (1.6 m), weight 66.4 kg, SpO2 100 %. GEN  older woman, NAD, sitting in bed HEENT EOMI PERRL NECK no JVD PULM clear except for mild coarse sounds in bases CV RRR no m/r/g ABD soft, nontender EXT no LE edema NEURO AAO x 3 nonfocal ACCESS L IJ TDC   Assessment/Plan: 1 Syncope/vomiting: appears back to baseline.  Doubt CVA, Ct abd/ pelvis without acute pathology.  Trops x 2.  Per primary. 2 ESRD: MWF normally- HD off schedule today and then back to normal MWF schedule tomorrow 3 Hypertension:   reasonably well controlled 4. Anemia of ESRD: Hgb 8.3, will give ESA 5. Metabolic Bone Disease: binders as she eats 6.  Elevated LFTs: holding lipitor--> she is also on acyclovir, presumably for primary prevention of HSV/ VZV while on velcade.  Can cause elevated LFTs--> consider holding for now 7.  Multiple myeloma: on Velcade 8.  Dispo: OBS  Madelon Lips 10/02/2021, 9:33 AM

## 2021-10-02 NOTE — Progress Notes (Signed)
Subjective: Patient tolerating diet well.  Denies any nausea or vomiting.  Denies any abdominal pain.  Objective: Vital signs in last 24 hours: Temp:  [97.7 F (36.5 C)-98.5 F (36.9 C)] 97.7 F (36.5 C) (07/27 0604) Pulse Rate:  [37-107] 51 (07/27 0604) Resp:  [14-29] 20 (07/27 0604) BP: (102-153)/(54-91) 129/59 (07/27 0604) SpO2:  [92 %-100 %] 100 % (07/27 0604) Weight:  [61.1 kg-66.4 kg] 66.4 kg (07/26 1845) Last BM Date : 10/01/21  Intake/Output from previous day: No intake/output data recorded. Intake/Output this shift: No intake/output data recorded.  General appearance: alert, cooperative, and no distress GI: soft, non-tender; bowel sounds normal; no masses,  no organomegaly  Lab Results:  Recent Labs    10/01/21 1032 10/02/21 0615  WBC 13.0* 7.8  HGB 9.1* 8.6*  HCT 28.8* 27.1*  PLT 251 180   BMET Recent Labs    10/01/21 1032 10/02/21 0615  NA 135 137  K 3.7 3.9  CL 95* 97*  CO2 23 25  GLUCOSE 198* 86  BUN 59* 66*  CREATININE 9.01* 10.58*  CALCIUM 9.6 9.2   PT/INR No results for input(s): "LABPROT", "INR" in the last 72 hours.  Studies/Results: CT CHEST ABDOMEN PELVIS W CONTRAST  Result Date: 10/01/2021 CLINICAL DATA:  Concern for aspiration pneumonia. Abnormal liver labs. Confusion. EXAM: CT CHEST, ABDOMEN, AND PELVIS WITH CONTRAST TECHNIQUE: Multidetector CT imaging of the chest, abdomen and pelvis was performed following the standard protocol during bolus administration of intravenous contrast. RADIATION DOSE REDUCTION: This exam was performed according to the departmental dose-optimization program which includes automated exposure control, adjustment of the mA and/or kV according to patient size and/or use of iterative reconstruction technique. CONTRAST:  185m OMNIPAQUE IOHEXOL 300 MG/ML  SOLN COMPARISON:  Ultrasound 10/01/2021.  Radiography 10/01/2021. FINDINGS: CT CHEST FINDINGS Cardiovascular: Heart size is normal. Extensive coronary artery  calcification. Aortic atherosclerotic calcification. Aortic valve region calcification. Mediastinum/Nodes: No mediastinal or hilar mass or lymphadenopathy. Small hiatal hernia. Lungs/Pleura: The lung parenchyma is clear. No infiltrate or collapse. Small area of scarring in the medial left lower lobe. Musculoskeletal: Curvature in chronic degenerative change of the spine. Bridging thoracic osteophytes. Acute appearing superior endplate fracture at TL24 L1 and L2. CT ABDOMEN PELVIS FINDINGS Hepatobiliary: Liver parenchyma is normal. Multiple stones within the gallbladder. No CT sign of cholecystitis or obstruction. Pancreas: 1 x 2 cm cystic abnormality in the ventral head of the pancreas. Spleen: Normal Adrenals/Urinary Tract: Adrenal glands are normal. Renal cortical atrophy with multiple cysts. Extensive renovascular calcification including a 1 cm aneurysm in the right renal hilum. Nonobstructing 5 mm stone in the lower pole of the left kidney. Exophytic abnormality at the inferior pole of the right kidney is indeterminate for cyst versus mass, measuring up to 2.7 cm in size. Bladder is normal. Stomach/Bowel: Small hiatal hernia. Stomach otherwise normal. Small bowel pattern is normal. No sign of appendicitis. Mild diverticulosis of the colon without visible diverticulitis. Vascular/Lymphatic: Aortic atherosclerosis. No aneurysm. IVC is normal. No adenopathy. Reproductive: Multiple calcified leiomyomas of the uterus. No other pelvic masses seen. Other: No free fluid or air. Musculoskeletal: As noted above, probably acute superior endplate fractures at TM01 L1 and L2. Lower lumbar degenerative changes with anterolisthesis at L4-5 and probable stenosis at this level. Previous gamma nail fixation of a right femur fracture. Probable Paget's disease within the right hemipelvis. IMPRESSION: No aspiration pneumonia. Mild focal scarring at the medial left lung base. Coronary artery calcification. Aortic atherosclerotic  calcification. Aortic Atherosclerosis (ICD10-I70.0). Probably acute  superior endplate fractures at O96, L1 and L2. No retropulsed bone. Renal atrophy. Multiple renal cysts. Indeterminate 2.7 cm lesion at the lower pole the right kidney could be a complicated cyst or a mass. This could be evaluated with renal MRI. 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 Nonobstructing 5 mm stone in the lower pole of the left kidney. 1 x 2 cm cystic abnormality in the ventral head of the pancreas. Best practice recommendation is for reimaging of this every 6 months for 2 years and then every 1 year for 2 years and then every 2 years for 6 years. Follow-up can be discontinued if stable for 10 years. This recommendation could be tailored to this patient's clinical status and life expectancy. Paget's disease of the right hemipelvis. Diverticulosis without evidence of diverticulitis. Small hiatal hernia. Multiple calcified leiomyomas the uterus. Electronically Signed   By: Nelson Chimes M.D.   On: 10/01/2021 14:44   US Abdomen Limited RUQ (LIVER/GB)  Result Date: 10/01/2021 CLINICAL DATA:  Vomiting, found unresponsive EXAM: ULTRASOUND ABDOMEN LIMITED RIGHT UPPER QUADRANT COMPARISON:  None Available. FINDINGS: Gallbladder: No wall thickening. Gallstones are present, largest measures 2.4 cm. No sonographic Murphy sign noted by sonographer. Common bile duct: Diameter: 2.4 mm Liver: No focal lesion identified. Increased parenchymal echogenicity. Portal vein is patent on color Doppler imaging with normal direction of blood flow towards the liver. Other: Evaluation somewhat limited due to patient positioning. IMPRESSION: 1. Cholelithiasis with no evidence of acute cholecystitis. 2. Increased echogenicity of the liver parenchyma, findings suggestive of hepatic steatosis. Electronically Signed   By: Yetta Glassman M.D.   On: 10/01/2021 12:08   DG Chest Portable 1 View  Result Date: 10/01/2021 CLINICAL DATA:  Possible aspiration. EXAM:  PORTABLE CHEST 1 VIEW COMPARISON:  Radiographs 07/23/2020 and 07/14/2020. FINDINGS: 0957 hours. Left IJ hemodialysis catheter projects to the level of the upper right atrium. The heart size and mediastinal contours are stable with aortic atherosclerosis. There is mild atelectasis or scarring at both lung bases, but no confluent airspace opacity, edema, pneumothorax or significant pleural effusion. Small surgical clips are present in the right axilla. No acute osseous findings are evident. Telemetry leads overlie the chest. IMPRESSION: No evidence of active cardiopulmonary process. Electronically Signed   By: Richardean Sale M.D.   On: 10/01/2021 10:10    Anti-infectives: Anti-infectives (From admission, onward)    Start     Dose/Rate Route Frequency Ordered Stop   10/02/21 1000  acyclovir (ZOVIRAX) 200 MG capsule 200 mg       Note to Pharmacy: (0800)     200 mg Oral Daily 10/01/21 2224         Assessment/Plan: Impression: Cholelithiasis, transaminitis.  Patient clinically is asymptomatic.  Her liver enzyme tests are normalizing.  She never had a bump in her total bilirubin.  Her elevated troponin is noted.  No leukocytosis. Plan: No need for acute surgical intervention.  Patient is at high risk for complications from surgical intervention.  Would continue to monitor LFTs.  Discussed with Dr. Denton Brick.  We will follow peripherally with you.  LOS: 0 days    Aviva Signs 10/02/2021

## 2021-10-02 NOTE — Progress Notes (Signed)
Received patient in bed, alert and oriented. Informed consent signed and in chart.  Time tx completed:1430  HD treatment completed. Patient tolerated well. Fistula/Graft/HD catheter without signs and symptoms of complications. Patient transported back to the room, alert and orient and in no acute distress. Report given to bedside RN.  Total UF removed:2000  ml  Medication given:none  Post HD VS:130/72. HR 64. R 18  Post HD weight: 64.2kg

## 2021-10-02 NOTE — Telephone Encounter (Signed)
Transition Care Management Unsuccessful Follow-up Telephone Call  Date of discharge and from where:  patient has not been discharged from hospital admitted on 10/01/2021  Attempts:  1st Attempt  Reason for unsuccessful TCM follow-up call:  Unable to reach patient, patient is also a SNF patient at [facility]

## 2021-10-02 NOTE — Progress Notes (Signed)
*  PRELIMINARY RESULTS* Echocardiogram 2D Echocardiogram has been performed.  Carly Wood 10/02/2021, 4:21 PM

## 2021-10-02 NOTE — TOC Progression Note (Signed)
  Transition of Care [facility]) Screening Note   Patient Details  Name: Carly Wood Date of Birth: January 24, 1936   Transition of Care [facility]) CM/SW Contact:    Boneta Lucks, RN Phone Number: 10/02/2021, 11:14 AM  From [facility], TOC checking to see if she will need INS AUTH to return tomorrow after HD.  Transition of Care Department [facility]) has reviewed patient and no TOC needs have been identified at this time. We will continue to monitor patient advancement through interdisciplinary progression rounds. If new patient transition needs arise, please place a TOC consult.    Expected Discharge Plan: Skilled Nursing Facility Barriers to Discharge: Continued Medical Work up  Expected Discharge Plan and Services Expected Discharge Plan: [place]

## 2021-10-02 NOTE — Progress Notes (Signed)
PROGRESS NOTE     Carly Wood, is a 86 y.o. female, DOB - [birth year], QIO:[id number]  Admit date - 10/01/2021   Admitting Physician Chianne Byrns Denton Brick, MD  Outpatient Primary MD for the patient is Gerlene Fee, NP  LOS - 0  Chief Complaint  Patient presents with   Aspiration        Brief Narrative:  86 y.o. female with medical history significant for ESRD, hypertension, diabetes mellitus, dementia, multiple myeloma admitted on 10/01/2021 from SNF facility after episode of unresponsiveness/syncope associated with emesis, found to have elevated troponin    -Assessment and Plan: 1)Syncope -No focal neurodeficits  -Troponin is 100, EKG sinus rhythm without acute changes -Echo from 10/02/2021 with EF of 50 to 35%, no wall motion abnormalities, patient does have grade 1 diastolic dysfunction and LVH -Patient has no recollection of unresponsive episode  2) transaminitis --- -AST and ALT trending down  -Alk phos and T. bili are not elevated , lipase was only 74 -Acute viral hepatitis profile is negative --continue to hold Lipitor -Discussed with general surgeon Dr. Arnoldo Morale no surgical indication at this time  3)ESRD (end stage renal disease) -MWF schedule patient had HD on 10/02/2021 off schedule. -Plan is for repeat HD per schedule on 10/03/2021  4)Breast cancer /multiple myeloma  -Continue anastrozole and Velcade respectively -Follow-up with Dr. Delton Coombes  5)HTN -Continue atenolol and clonidine  6)H/o DM2-  Use Novolog/Humalog Sliding scale insulin with Accu-Cheks/Fingersticks as ordered   7)Multiple myeloma ([place]) Follows with Dr. Delton Coombes.  Per last note 08/2021, patient is on  every 2 weeks.  Disposition/Need for in-Hospital Stay- patient unable to be discharged at this time due to syncopal episodes... Elevated LFTs, elevated troponin -Possible discharge back to SNF after hemodialysis on 10/03/2021 especially if repeat troponin and EKG are reassuring -Watch for  arrhythmias on telemetry which may explain patient's episodes of syncope  Status is: Inpatient   Disposition: The patient is from: Home              Anticipated d/c is to: Home              Anticipated d/c date is: 1 day              Patient currently is not medically stable to d/c. Barriers: Not Clinically Stable-   Code Status :  -  Code Status: DNR   Family Communication:    NA (patient is alert, awake and coherent)   DVT Prophylaxis  :   - SCDs  heparin injection 5,000 Units Start: 10/01/21 2200   Lab Results  Component Value Date   PLT 191 10/02/2021    Inpatient Medications  Scheduled Meds:  acyclovir  200 mg Oral Daily   anastrozole  1 mg Oral Daily   atenolol  25 mg Oral Daily   Chlorhexidine Gluconate Cloth  6 each Topical Daily   Chlorhexidine Gluconate Cloth  6 each Topical Q0600   cloNIDine  0.1 mg Oral QHS   heparin  5,000 Units Subcutaneous Q8H   melatonin  6 mg Oral QHS   pantoprazole  40 mg Oral BID   sertraline  75 mg Oral Daily   Continuous Infusions:  anticoagulant sodium citrate     PRN Meds:.acetaminophen **OR** acetaminophen, alteplase, anticoagulant sodium citrate, heparin, lidocaine (PF), lidocaine-prilocaine, pentafluoroprop-tetrafluoroeth, polyethylene glycol, promethazine   Anti-infectives (From admission, onward)    Start     Dose/Rate Route Frequency Ordered Stop   10/02/21 1000  acyclovir (ZOVIRAX) 200 MG capsule  200 mg       Note to Pharmacy: (0800)     200 mg Oral Daily 10/01/21 2224           Subjective: Carly Wood today has no fevers, no further emesis,  No chest pain,    Oral intake is fair  Objective: Vitals:   10/02/21 1400 10/02/21 1430 10/02/21 1436 10/02/21 1500  BP: 127/72 134/69 130/72   Pulse: 64 65 66   Resp: _0 Temp:      TempSrc:      SpO2:   100%   Weight:    64.2 kg  Height:        Intake/Output Summary (Last 24 hours) at 10/02/2021 1800 Last data filed at 10/02/2021 1436 Gross per 24 hour   Intake 240 ml  Output 2200 ml  Net -1960 ml   Filed Weights   10/01/21 1845 10/02/21 1118 10/02/21 1500  Weight: 66.4 kg 66.4 kg 64.2 kg    Physical Exam  Gen:- Awake Alert,  in no apparent distress  HEENT:- [place].AT, No sclera icterus Neck-Supple Neck,No JVD,.  Lungs-  CTAB , fair symmetrical air movement CV- S1, S2 normal, regular  Abd-  +ve B.Sounds, Abd Soft, No tenderness,    Extremity/Skin:- No  edema, pedal pulses present  Psych-affect is appropriate, oriented x3 Neuro-generalized weakness, no new focal deficits, no tremors MSK- Lt UE AV fistula with positive thrill and bruit  Data Reviewed: I have personally reviewed following labs and imaging studies  CBC: Recent Labs  Lab 09/30/21 1216 10/01/21 1032 10/02/21 0615 10/02/21 0840  WBC 6.6 13.0* 7.8 7.8  NEUTROABS 4.9 11.6*  --   --   HGB 9.6* 9.1* 8.6* 8.3*  HCT 31.1* 28.8* 27.1* 26.5*  MCV 101.3* 99.3 100.0 100.4*  PLT 232 251 180 623   Basic Metabolic Panel: Recent Labs  Lab 09/30/21 1216 10/01/21 1032 10/02/21 0615 10/02/21 0840  NA 136 135 137 134*  K 4.0 3.7 3.9 4.0  CL 96* 95* 97* 96*  CO2 _1 GLUCOSE 222* 198* 86 167*  BUN 40* 59* 66* 69*  CREATININE 6.87* 9.01* 10.58* 11.15*  CALCIUM 9.2 9.6 9.2 8.9  MG  --  2.2  --   --   PHOS  --   --   --  3.6   GFR: Estimated Creatinine Clearance: 3.4 mL/min (A) (by C-G formula based on SCr of 11.15 mg/dL (H)). Liver Function Tests: Recent Labs  Lab 09/30/21 1216 10/01/21 1032 10/02/21 0615 10/02/21 0840  AST 18 212* 66*  --   ALT 12 173* 116*  --   ALKPHOS 69 120 103  --   BILITOT 0.5 0.7 0.6  --   PROT 6.8 6.7 6.1*  --   ALBUMIN 3.7 3.6 3.4* 3.4*   Cardiac Enzymes: No results for input(s): "CKTOTAL", "CKMB", "CKMBINDEX", "TROPONINI" in the last 168 hours. BNP (last 3 results) No results for input(s): "PROBNP" in the last 8760 hours. HbA1C: No results for input(s): "HGBA1C" in the last 72 hours. Sepsis  Labs: _2 (procalcitonin:4,lacticidven:4) )No results found for this or any previous visit (from the past 240 hour(s)).    Radiology Studies: ECHOCARDIOGRAM COMPLETE  Result Date: 10/02/2021    ECHOCARDIOGRAM REPORT   Patient Name:   Carly Wood Date of Exam: 10/02/2021 Medical Rec #:  [id number]      Height:       63.0 in Accession #:    [id number]  Weight:       146.4 lb Date of Birth:  [birth year]     BSA:          1.693 m Patient Age:    42 years       BP:           129/59 mmHg Patient Gender: F              HR:           66 bpm. Exam Location:  [facility] Procedure: 2D Echo, Cardiac Doppler and Color Doppler Indications:    Syncope  History:        Patient has no prior history of Echocardiogram examinations.                 Signs/Symptoms:Syncope; Risk Factors:Diabetes, Hypertension and                 Dyslipidemia. Breast CA, Multiple myleoma, CKD on Dialysis.  Sonographer:    Wenda Low Referring Phys: [place]  1. Left ventricular ejection fraction, by estimation, is 70 to 75%. The left ventricle has hyperdynamic function. The left ventricle has no regional wall motion abnormalities. There is moderate asymmetric left ventricular hypertrophy of the basal-septal  segment. Left ventricular diastolic parameters are consistent with Grade I diastolic dysfunction (impaired relaxation).  2. Right ventricular systolic function is normal. The right ventricular size is normal. There is normal pulmonary artery systolic pressure. The estimated right ventricular systolic pressure is 84.1 mmHg.  3. Left atrial size was mildly dilated.  4. The mitral valve is degenerative. Trivial mitral valve regurgitation. No evidence of mitral stenosis. Moderate mitral annular calcification.  5. The aortic valve was not well visualized. Aortic valve regurgitation is not visualized. Mild aortic valve stenosis.  6. The inferior vena cava is normal in size with greater than 50% respiratory  variability, suggesting right atrial pressure of 3 mmHg. FINDINGS  Left Ventricle: Left ventricular ejection fraction, by estimation, is 70 to 75%. The left ventricle has hyperdynamic function. The left ventricle has no regional wall motion abnormalities. The left ventricular internal cavity size was small. There is moderate asymmetric left ventricular hypertrophy of the basal-septal segment. Left ventricular diastolic parameters are consistent with Grade I diastolic dysfunction (impaired relaxation). Right Ventricle: The right ventricular size is normal. No increase in right ventricular wall thickness. Right ventricular systolic function is normal. There is normal pulmonary artery systolic pressure. The tricuspid regurgitant velocity is 2.14 m/s, and  with an assumed right atrial pressure of 3 mmHg, the estimated right ventricular systolic pressure is 32.4 mmHg. Left Atrium: Left atrial size was mildly dilated. Right Atrium: Right atrial size was normal in size. Pericardium: There is no evidence of pericardial effusion. Mitral Valve: The mitral valve is degenerative in appearance. Moderate mitral annular calcification. Trivial mitral valve regurgitation. No evidence of mitral valve stenosis. MV peak gradient, 5.4 mmHg. The mean mitral valve gradient is 1.0 mmHg. Tricuspid Valve: The tricuspid valve is normal in structure. Tricuspid valve regurgitation is trivial. Aortic Valve: The aortic valve was not well visualized. Aortic valve regurgitation is not visualized. Mild aortic stenosis is present. Aortic valve mean gradient measures 7.5 mmHg. Aortic valve peak gradient measures 14.9 mmHg. Aortic valve area, by VTI measures 1.91 cm. Pulmonic Valve: The pulmonic valve was not well visualized. Pulmonic valve regurgitation is not visualized. Aorta: The aortic root and ascending aorta are structurally normal, with no evidence of dilitation. Venous: The inferior vena cava is  normal in size with greater than 50%  respiratory variability, suggesting right atrial pressure of 3 mmHg. IAS/Shunts: The interatrial septum was not well visualized.  LEFT VENTRICLE PLAX 2D LVIDd:         3.60 cm   Diastology LVIDs:         2.60 cm   LV e' medial:    3.26 cm/s LV PW:         0.90 cm   LV E/e' medial:  16.2 LV IVS:        1.10 cm   LV e' lateral:   6.42 cm/s LVOT diam:     2.00 cm   LV E/e' lateral: 8.2 LV SV:         80 LV SV Index:   47 LVOT Area:     3.14 cm  RIGHT VENTRICLE RV Basal diam:  2.95 cm RV Mid diam:    2.40 cm RV S prime:     13.10 cm/s TAPSE (M-mode): 2.6 cm LEFT ATRIUM             Index        RIGHT ATRIUM           Index LA diam:        2.70 cm 1.59 cm/m   RA Area:     15.20 cm LA Vol (A2C):   66.1 ml 39.03 ml/m  RA Volume:   40.30 ml  23.80 ml/m LA Vol (A4C):   65.6 ml 38.74 ml/m LA Biplane Vol: 68.4 ml 40.39 ml/m  AORTIC VALVE                     PULMONIC VALVE AV Area (Vmax):    1.91 cm      PV Vmax:       0.74 m/s AV Area (Vmean):   1.85 cm      PV Peak grad:  2.2 mmHg AV Area (VTI):     1.91 cm AV Vmax:           193.00 cm/s AV Vmean:          122.000 cm/s AV VTI:            0.418 m AV Peak Grad:      14.9 mmHg AV Mean Grad:      7.5 mmHg LVOT Vmax:         117.50 cm/s LVOT Vmean:        71.900 cm/s LVOT VTI:          0.254 m LVOT/AV VTI ratio: 0.61  AORTA Ao Root diam: 3.50 cm Ao Asc diam:  3.00 cm MITRAL VALVE               TRICUSPID VALVE MV Area (PHT): 1.95 cm    TR Peak grad:   18.3 mmHg MV Area VTI:   2.81 cm    TR Vmax:        214.00 cm/s MV Peak grad:  5.4 mmHg MV Mean grad:  1.0 mmHg    SHUNTS MV Vmax:       1.16 m/s    Systemic VTI:  0.25 m MV Vmean:      54.7 cm/s   Systemic Diam: 2.00 cm MV Decel Time: 389 msec MV E velocity: 52.70 cm/s MV A velocity: 88.70 cm/s MV E/A ratio:  0.59 Oswaldo Milian MD Electronically signed by Oswaldo Milian MD Signature Date/Time: 10/02/2021/4:41:07 PM    Final    CT  CHEST ABDOMEN PELVIS W CONTRAST  Result Date: 10/01/2021 CLINICAL DATA:   Concern for aspiration pneumonia. Abnormal liver labs. Confusion. EXAM: CT CHEST, ABDOMEN, AND PELVIS WITH CONTRAST TECHNIQUE: Multidetector CT imaging of the chest, abdomen and pelvis was performed following the standard protocol during bolus administration of intravenous contrast. RADIATION DOSE REDUCTION: This exam was performed according to the departmental dose-optimization program which includes automated exposure control, adjustment of the mA and/or kV according to patient size and/or use of iterative reconstruction technique. CONTRAST:  159m OMNIPAQUE IOHEXOL 300 MG/ML  SOLN COMPARISON:  Ultrasound 10/01/2021.  Radiography 10/01/2021. FINDINGS: CT CHEST FINDINGS Cardiovascular: Heart size is normal. Extensive coronary artery calcification. Aortic atherosclerotic calcification. Aortic valve region calcification. Mediastinum/Nodes: No mediastinal or hilar mass or lymphadenopathy. Small hiatal hernia. Lungs/Pleura: The lung parenchyma is clear. No infiltrate or collapse. Small area of scarring in the medial left lower lobe. Musculoskeletal: Curvature in chronic degenerative change of the spine. Bridging thoracic osteophytes. Acute appearing superior endplate fracture at TE70 L1 and L2. CT ABDOMEN PELVIS FINDINGS Hepatobiliary: Liver parenchyma is normal. Multiple stones within the gallbladder. No CT sign of cholecystitis or obstruction. Pancreas: 1 x 2 cm cystic abnormality in the ventral head of the pancreas. Spleen: Normal Adrenals/Urinary Tract: Adrenal glands are normal. Renal cortical atrophy with multiple cysts. Extensive renovascular calcification including a 1 cm aneurysm in the right renal hilum. Nonobstructing 5 mm stone in the lower pole of the left kidney. Exophytic abnormality at the inferior pole of the right kidney is indeterminate for cyst versus mass, measuring up to 2.7 cm in size. Bladder is normal. Stomach/Bowel: Small hiatal hernia. Stomach otherwise normal. Small bowel pattern is normal.  No sign of appendicitis. Mild diverticulosis of the colon without visible diverticulitis. Vascular/Lymphatic: Aortic atherosclerosis. No aneurysm. IVC is normal. No adenopathy. Reproductive: Multiple calcified leiomyomas of the uterus. No other pelvic masses seen. Other: No free fluid or air. Musculoskeletal: As noted above, probably acute superior endplate fractures at TJ50 L1 and L2. Lower lumbar degenerative changes with anterolisthesis at L4-5 and probable stenosis at this level. Previous gamma nail fixation of a right femur fracture. Probable Paget's disease within the right hemipelvis. IMPRESSION: No aspiration pneumonia. Mild focal scarring at the medial left lung base. Coronary artery calcification. Aortic atherosclerotic calcification. Aortic Atherosclerosis (ICD10-I70.0). Probably acute superior endplate fractures at TK93 L1 and L2. No retropulsed bone. Renal atrophy. Multiple renal cysts. Indeterminate 2.7 cm lesion at the lower pole the right kidney could be a complicated cyst or a mass. This could be evaluated with renal MRI. 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 Nonobstructing 5 mm stone in the lower pole of the left kidney. 1 x 2 cm cystic abnormality in the ventral head of the pancreas. Best practice recommendation is for reimaging of this every 6 months for 2 years and then every 1 year for 2 years and then every 2 years for 6 years. Follow-up can be discontinued if stable for 10 years. This recommendation could be tailored to this patient's clinical status and life expectancy. Paget's disease of the right hemipelvis. Diverticulosis without evidence of diverticulitis. Small hiatal hernia. Multiple calcified leiomyomas the uterus. Electronically Signed   By: MNelson ChimesM.D.   On: 10/01/2021 14:44   [place]Abdomen Limited RUQ (LIVER/GB)  Result Date: 10/01/2021 CLINICAL DATA:  Vomiting, found unresponsive EXAM: ULTRASOUND ABDOMEN LIMITED RIGHT UPPER QUADRANT COMPARISON:  None Available. FINDINGS:  Gallbladder: No wall thickening. Gallstones are present, largest measures 2.4 cm. No sonographic Murphy sign  noted by sonographer. Common bile duct: Diameter: 2.4 mm Liver: No focal lesion identified. Increased parenchymal echogenicity. Portal vein is patent on color Doppler imaging with normal direction of blood flow towards the liver. Other: Evaluation somewhat limited due to patient positioning. IMPRESSION: 1. Cholelithiasis with no evidence of acute cholecystitis. 2. Increased echogenicity of the liver parenchyma, findings suggestive of hepatic steatosis. Electronically Signed   By: Yetta Glassman M.D.   On: 10/01/2021 12:08   DG Chest Portable 1 View  Result Date: 10/01/2021 CLINICAL DATA:  Possible aspiration. EXAM: PORTABLE CHEST 1 VIEW COMPARISON:  Radiographs 07/23/2020 and 07/14/2020. FINDINGS: 0957 hours. Left IJ hemodialysis catheter projects to the level of the upper right atrium. The heart size and mediastinal contours are stable with aortic atherosclerosis. There is mild atelectasis or scarring at both lung bases, but no confluent airspace opacity, edema, pneumothorax or significant pleural effusion. Small surgical clips are present in the right axilla. No acute osseous findings are evident. Telemetry leads overlie the chest. IMPRESSION: No evidence of active cardiopulmonary process. Electronically Signed   By: Richardean Sale M.D.   On: 10/01/2021 10:10     Scheduled Meds:  acyclovir  200 mg Oral Daily   anastrozole  1 mg Oral Daily   atenolol  25 mg Oral Daily   Chlorhexidine Gluconate Cloth  6 each Topical Daily   Chlorhexidine Gluconate Cloth  6 each Topical Q0600   cloNIDine  0.1 mg Oral QHS   heparin  5,000 Units Subcutaneous Q8H   melatonin  6 mg Oral QHS   pantoprazole  40 mg Oral BID   sertraline  75 mg Oral Daily   Continuous Infusions:  anticoagulant sodium citrate       LOS: 0 days    Roxan Hockey M.D on 10/02/2021 at 6:00 PM  Go to [email or web address] - for  contact info  [facility] - Office  [phone]  If 7PM-7AM, please contact night-coverage [email or web address] 10/02/2021, 6:00 PM

## 2021-10-03 DIAGNOSIS — C9000 Multiple myeloma not having achieved remission: Secondary | ICD-10-CM | POA: Diagnosis not present

## 2021-10-03 DIAGNOSIS — E1122 Type 2 diabetes mellitus with diabetic chronic kidney disease: Secondary | ICD-10-CM | POA: Diagnosis not present

## 2021-10-03 DIAGNOSIS — Z79899 Other long term (current) drug therapy: Secondary | ICD-10-CM | POA: Diagnosis not present

## 2021-10-03 DIAGNOSIS — N250 Renal osteodystrophy: Secondary | ICD-10-CM | POA: Diagnosis not present

## 2021-10-03 DIAGNOSIS — D631 Anemia in chronic kidney disease: Secondary | ICD-10-CM | POA: Diagnosis not present

## 2021-10-03 DIAGNOSIS — F0390 Unspecified dementia without behavioral disturbance: Secondary | ICD-10-CM | POA: Diagnosis not present

## 2021-10-03 DIAGNOSIS — C50911 Malignant neoplasm of unspecified site of right female breast: Secondary | ICD-10-CM | POA: Diagnosis not present

## 2021-10-03 DIAGNOSIS — R55 Syncope and collapse: Secondary | ICD-10-CM | POA: Diagnosis not present

## 2021-10-03 DIAGNOSIS — N186 End stage renal disease: Secondary | ICD-10-CM | POA: Diagnosis not present

## 2021-10-03 DIAGNOSIS — I120 Hypertensive chronic kidney disease with stage 5 chronic kidney disease or end stage renal disease: Secondary | ICD-10-CM | POA: Diagnosis not present

## 2021-10-03 DIAGNOSIS — R7989 Other specified abnormal findings of blood chemistry: Secondary | ICD-10-CM | POA: Diagnosis present

## 2021-10-03 DIAGNOSIS — Z992 Dependence on renal dialysis: Secondary | ICD-10-CM | POA: Diagnosis not present

## 2021-10-03 DIAGNOSIS — Z95820 Peripheral vascular angioplasty status with implants and grafts: Secondary | ICD-10-CM | POA: Diagnosis not present

## 2021-10-03 LAB — IMMUNOFIXATION ELECTROPHORESIS
IgA: 271 mg/dL (ref 64–422)
IgG (Immunoglobin G), Serum: 689 mg/dL (ref 586–1602)
IgM (Immunoglobulin M), Srm: 10 mg/dL — ABNORMAL LOW (ref 26–217)
Total Protein ELP: 6.2 g/dL (ref 6.0–8.5)

## 2021-10-03 LAB — CBC
HCT: 27.5 % — ABNORMAL LOW (ref 36.0–46.0)
Hemoglobin: 8.6 g/dL — ABNORMAL LOW (ref 12.0–15.0)
MCH: 31.2 pg (ref 26.0–34.0)
MCHC: 31.3 g/dL (ref 30.0–36.0)
MCV: 99.6 fL (ref 80.0–100.0)
Platelets: 179 10*3/uL (ref 150–400)
RBC: 2.76 MIL/uL — ABNORMAL LOW (ref 3.87–5.11)
RDW: 16.9 % — ABNORMAL HIGH (ref 11.5–15.5)
WBC: 7.2 10*3/uL (ref 4.0–10.5)
nRBC: 0 % (ref 0.0–0.2)

## 2021-10-03 LAB — HEPATIC FUNCTION PANEL
ALT: 99 U/L — ABNORMAL HIGH (ref 0–44)
AST: 45 U/L — ABNORMAL HIGH (ref 15–41)
Albumin: 3.5 g/dL (ref 3.5–5.0)
Alkaline Phosphatase: 95 U/L (ref 38–126)
Bilirubin, Direct: <0.1 mg/dL (ref 0.0–0.2)
Total Bilirubin: 0.4 mg/dL (ref 0.3–1.2)
Total Protein: 6.5 g/dL (ref 6.5–8.1)

## 2021-10-03 LAB — BASIC METABOLIC PANEL
Anion gap: 11 (ref 5–15)
BUN: 34 mg/dL — ABNORMAL HIGH (ref 8–23)
CO2: 28 mmol/L (ref 22–32)
Calcium: 8.8 mg/dL — ABNORMAL LOW (ref 8.9–10.3)
Chloride: 96 mmol/L — ABNORMAL LOW (ref 98–111)
Creatinine, Ser: 7.04 mg/dL — ABNORMAL HIGH (ref 0.44–1.00)
GFR, Estimated: 5 mL/min — ABNORMAL LOW (ref >60)
Glucose, Bld: 105 mg/dL — ABNORMAL HIGH (ref 70–99)
Potassium: 3.6 mmol/L (ref 3.5–5.1)
Sodium: 135 mmol/L (ref 135–145)

## 2021-10-03 LAB — HEPATITIS B SURFACE ANTIBODY, QUANTITATIVE: Hep B S AB Quant (Post): <3.1 m[IU]/mL — ABNORMAL LOW (ref >9.9)

## 2021-10-03 LAB — TROPONIN I (HIGH SENSITIVITY): Troponin I (High Sensitivity): 99 ng/L — ABNORMAL HIGH (ref <18)

## 2021-10-03 MED ORDER — HEPARIN SODIUM (PORCINE) 1000 UNIT/ML IJ SOLN
INTRAMUSCULAR | Status: AC
  Administered 2021-10-03: 1000 [IU]
  Filled 2021-10-03: qty 4

## 2021-10-03 NOTE — Progress Notes (Signed)
Patient rested throughout the night. No complaints at this time.

## 2021-10-03 NOTE — Discharge Summary (Signed)
Carly Wood, is a 86 y.o. female  DOB [birth year]  MRN [id number].  Admission date:  10/01/2021  Admitting Physician  Roxan Hockey, MD  Discharge Date:  10/03/2021   Primary MD  Gerlene Fee, NP  Recommendations for primary care physician for things to follow:   1)Very low-salt diet advised 2)Weigh yourself daily, call if you gain more than 3 pounds in 1 day or more than 5 pounds in 1 week as your hemodialysis dry weight may need to be adjusted 3)Limit your Fluid  intake to no more than 60 ounces (1.8 Liters) per day 4)Stop Clonidine and Stop Atenolol due to soft blood pressure 5)Stop Lipitor/Atorvastatin due to elevated liver function test...  6)Repeat complete metabolic profile including liver function test and CBC blood test within the next 5 to 7 days  Admission Diagnosis  Syncope [R55] Calculus of gallbladder without cholecystitis without obstruction [K80.20] Syncope, unspecified syncope type [R55] Elevated LFTs [R79.89]   Discharge Diagnosis  Syncope [R55] Calculus of gallbladder without cholecystitis without obstruction [K80.20] Syncope, unspecified syncope type [R55] Elevated LFTs [R79.89]    Principal Problem:   Syncope Active Problems:   Elevated liver enzymes   ESRD (end stage renal disease) (HCC)   Multiple myeloma (HCC)   Diabetes mellitus with ESRD (end-stage renal disease) ([place])   Hypertension due to end stage renal disease caused by type 2 diabetes mellitus, on dialysis [facility])   Vascular dementia with behavior disturbance (HCC)   Breast cancer (HCC)   Elevated LFTs      Past Medical History:  Diagnosis Date   Anemia     chronic macrocytic anemia   Anxiety    Chronic kidney disease    Chronic renal disease, stage 4, severely decreased glomerular filtration rate (GFR) between 15-29 mL/min/1.73 square meter ([place]) 75/64/3329   Complication of anesthesia    delirious  after Breast Surgery   Dementia ([place])    mild   Depression    Diabetes mellitus with ESRD (end-stage renal disease) ([place])    type II   Dysphagia    Dyspnea    with activity   GERD (gastroesophageal reflux disease)    Glaucoma    Hyperlipidemia    Hypertension    Pneumonia    Stage 1 infiltrating ductal carcinoma of right female breast ([place]) 08/21/2015   ER+ PR+ HER 2 neu + (3+) T1cN0     Past Surgical History:  Procedure Laterality Date   A/V FISTULAGRAM Right 07/05/2020   Procedure: A/V Fistulagram;  Surgeon: Elam Dutch, MD;  Location: [place] [facility];  Service: Cardiovascular;  Laterality: Right;   AV FISTULA PLACEMENT Left 11/22/2017   Procedure: ARTERIOVENOUS (AV) FISTULA CREATION LEFT ARM;  Surgeon: Elam Dutch, MD;  Location: [facility] OR;  Service: Vascular;  Laterality: Left;   AV FISTULA PLACEMENT Right 04/04/2020   Procedure: RIGHT ARM ARTERIOVENOUS FISTULA CREATION;  Surgeon: Rosetta Posner, MD;  Location: [facility];  Service: Vascular;  Laterality: Right;   AV FISTULA PLACEMENT Right 08/20/2020  Procedure: ARTERIOVENOUS (AV) FISTULA LIGATION RIGHT ARM;  Surgeon: Elam Dutch, MD;  Location: [facility] OR;  Service: Vascular;  Laterality: Right;   BIOPSY  08/07/2016   Procedure: BIOPSY;  Surgeon: Daneil Dolin, MD;  [facility];  Service: Endoscopy;;  gastric ulcer biopsy   COLONOSCOPY     ESOPHAGOGASTRODUODENOSCOPY N/A 08/07/2016   LA Grade A esophagitis s/p dilation, small hiatal hernia, multiple gastric ulcers and erosions, duodenal erosions s/p biopsy. Negative H.pylori    ESOPHAGOGASTRODUODENOSCOPY N/A 11/27/2016   normal esophagus, previously noted gastric ulcers completely healed, normal duodenum.    ESOPHAGOGASTRODUODENOSCOPY (EGD) WITH PROPOFOL N/A 07/23/2020   Procedure: ESOPHAGOGASTRODUODENOSCOPY (EGD) WITH PROPOFOL;  Surgeon: Rogene Houston, MD;  [facility];  Service: Endoscopy;  Laterality: N/A;   FISTULA SUPERFICIALIZATION Left  02/14/2018   Procedure: FISTULA SUPERFICIALIZATION LEFT ARM;  Surgeon: Angelia Mould, MD;  Location: [facility] OR;  Service: Vascular;  Laterality: Left;   FRACTURE SURGERY Right    ankle   HOT HEMOSTASIS  07/23/2020   Procedure: HOT HEMOSTASIS (ARGON PLASMA COAGULATION/BICAP);  Surgeon: Rogene Houston, MD;  [facility];  Service: Endoscopy;;   INTRAMEDULLARY (IM) NAIL INTERTROCHANTERIC Right 07/12/2020   Procedure: INTRAMEDULLARY (IM) NAIL INTERTROCHANTRIC;  Surgeon: Mordecai Rasmussen, MD;  Location: [facility];  Service: Orthopedics;  Laterality: Right;   MALONEY DILATION N/A 08/07/2016   Procedure: Venia Minks DILATION;  Surgeon: Daneil Dolin, MD;  [facility];  Service: Endoscopy;  Laterality: N/A;   MASTECTOMY, PARTIAL Right    multiple myeloma     PERIPHERAL VASCULAR BALLOON ANGIOPLASTY Left 07/13/2019   Procedure: PERIPHERAL VASCULAR BALLOON ANGIOPLASTY;  Surgeon: Marty Heck, MD;  Location: [place] [facility];  Service: Cardiovascular;  Laterality: Left;  arm fistulogram   PERIPHERAL VASCULAR BALLOON ANGIOPLASTY Right 05/22/2020   Procedure: PERIPHERAL VASCULAR BALLOON ANGIOPLASTY;  Surgeon: Cherre Robins, MD;  Location: [place] [facility];  Service: Cardiovascular;  Laterality: Right;  arm fistula   PERIPHERAL VASCULAR BALLOON ANGIOPLASTY Right 07/05/2020   Procedure: PERIPHERAL VASCULAR BALLOON ANGIOPLASTY;  Surgeon: Elam Dutch, MD;  Location: [place] [facility];  Service: Cardiovascular;  Laterality: Right;  arm fistula   PORT-A-CATH REMOVAL Left 11/22/2017   Procedure: REMOVAL PORT-A-CATH LEFT CHEST;  Surgeon: Elam Dutch, MD;  Location: [place];  Service: Vascular;  Laterality: Left;   RETINAL DETACHMENT SURGERY Right    SCLEROTHERAPY  07/23/2020   Procedure: SCLEROTHERAPY;  Surgeon: Rogene [place], MD;  [facility];  Service: Endoscopy;;   UPPER EXTREMITY VENOGRAPHY N/A 08/22/2020   Procedure: LEFT UPPER & CENTRAL VENOGRAPHY;  Surgeon:  Marty Heck, MD;  Location: [facility];  Service: Cardiovascular;  Laterality: N/A;     HPI  from the history and physical done on the day of admission:   Chief Complaint: Vomiting, unresponsiveness   HPI: Carly Wood is a 86 y.o. female with medical history significant for ESRD, hypertension, diabetes mellitus, dementia, multiple myeloma. Patient presented to the ED via EMS reports the patient was initially vomiting then became unresponsive.  At the time of EMS arrival, patient was responsive and following commands.  Concern the patient may have aspirated while vomiting may have had a stroke due to unresponsiveness.  Patient was still vomiting on arrival to the ED, attempting to clear her throat, which was suctioned in the ED. On my evaluation, patient is awake alert oriented to person and place.  She is  unable to tell me exactly why she was brought to the ED.  She tells me she vomited today after eating breakfast.  She denies pain, denies difficulty breathing.  Denies chest pain.  Denies diarrhea denies abdominal pain.   ED Course: O2 sats greater than 92% on room air.  Afebrile temperature 98.5.  Heart rate 60s.  WBC 13.  Elevated liver enzymes AST 212, ALT 173, normal ALP 120, T. bili 0.7, lipase 74. RUQ ultrasound shows cholelithiasis without acute cholecystitis, and findings suggestive of hepatic steatosis.   Abdominal chest abdomen and pelvis with contrast-no aspiration, renal atrophy with cysts, renal MRI suggested for follow-up.   EDP consulted general surgery, and GI.  Plan to trend liver enzymes for now.  No urgent Intervention needed.   Review of Systems: As per HPI all other systems reviewed and negative.       Hospital Course:   Assessment and Plan: Brief Narrative:  86 y.o. female with medical history significant for ESRD, hypertension, diabetes mellitus, dementia, multiple myeloma admitted on 10/01/2021 from SNF facility after episode of  unresponsiveness/syncope associated with emesis, found to have elevated troponin     -Assessment and Plan: 1)Syncope -No focal neurodeficits  -Troponin is 100>>99, EKG sinus rhythm without acute changes -Echo from 10/02/2021 with EF of 50 to 35%, no wall motion abnormalities, patient does have grade 1 diastolic dysfunction and LVH -Patient has no recollection of unresponsive episode -Blood pressures remain soft -Stop atenolol and stop clonidine to avoid further hypotensive episodes   2) transaminitis --- -AST and ALT trending down  -Alk phos and T. bili are not elevated , lipase was only 74 -Acute viral hepatitis profile is negative -Discontinue atorvastatin/Lipitor -Discussed with general surgeon Dr. Arnoldo Morale no surgical indication at this time -Repeat LFTs in 5 to 7 days advised    Latest Ref Rng & Units 10/03/2021    6:02 AM 10/02/2021    8:40 AM 10/02/2021    6:15 AM  Hepatic Function  Total Protein 6.5 - 8.1 g/dL 6.5   6.1   Albumin 3.5 - 5.0 g/dL 3.5  3.4  3.4   AST 15 - 41 U/L 45   66   ALT 0 - 44 U/L 99   116   Alk Phosphatase 38 - 126 U/L 95   103   Total Bilirubin 0.3 - 1.2 mg/dL 0.4   0.6   Bilirubin, Direct 0.0 - 0.2 mg/dL <0.1        3)ESRD (end stage renal disease) -MWF schedule patient had HD on 10/02/2021 off schedule. And then repeat HD per schedule on 10/03/2021 -Continue HD as outpatient on Monday Wednesdays Fridays per usual schedule   4)Breast cancer /multiple myeloma  -Continue anastrozole and Velcade respectively -Follow-up with Dr. Delton Coombes   5)HTN -blood pressure has been soft, showed discontinue clonidine and atenolol to avoid further hypotensive episodes of syncope  6)H/o DM2-previously diet controlled, continue same   7)Multiple myeloma ([place]) Follows with Dr. Delton Coombes.  Per last note 08/2021, patient is on  every 2 weeks.  8) chronic anemia--Hgb stable around baseline, suspect this is related to underlying ESRD compounded by breast cancer and  multiple myeloma and ongoing chemotherapy -Monitor CBC and transfuse as clinically indicated -ESA/Procrit as per nephrology service  Disposition--[facility]   Disposition: The patient is from: Home              Anticipated d/c is to: Home  Discharge Condition: stable  Follow UP   Contact information  for after-discharge care     [facility] .   Service: Skilled Nursing Contact information: [address] [place] [phone]                      Consults obtained -nephrology  Diet and Activity recommendation:  As advised  Discharge Instructions    Discharge Instructions     Call MD for:  difficulty breathing, headache or visual disturbances   Complete by: As directed    Call MD for:  persistant dizziness or light-headedness   Complete by: As directed    Call MD for:  persistant nausea and vomiting   Complete by: As directed    Call MD for:  temperature >100.4   Complete by: As directed    Diet - low sodium heart healthy   Complete by: As directed    Discharge instructions   Complete by: As directed    1)Very low-salt diet advised 2)Weigh yourself daily, call if you gain more than 3 pounds in 1 day or more than 5 pounds in 1 week as your hemodialysis dry weight may need to be adjusted 3)Limit your Fluid  intake to no more than 60 ounces (1.8 Liters) per day 4)Stop Clonidine and Stop Atenolol due to soft blood pressure 5)Stop Lipitor/Atorvastatin due to elevated liver function test...  6)Repeat complete metabolic profile including liver function test and CBC blood test within the next 5 to 7 days   Discharge wound care:   Complete by: As directed    Protective Foam Dressing to Sacrum   Increase activity slowly   Complete by: As directed          Discharge Medications     Allergies as of 10/03/2021       Reactions   Ace Inhibitors Other (See Comments), Cough   Tongue  swell , ie angioedema   Angiotensin Receptor Blockers    Angioedema with ACE-I   Penicillins Other (See Comments)   Unsure of reaction Has patient had a PCN reaction causing immediate rash, facial/tongue/throat swelling, SOB or lightheadedness with hypotension: Unknown Has patient had a PCN reaction causing severe rash involving mucus membranes or skin necrosis: Unknown Has patient had a PCN reaction that required hospitalization: No Has patient had a PCN reaction occurring within the last 10 years: Unknown If all of the above answers are "NO", then may proceed with Cephalosporin use.   Penicillin G         Medication List     STOP taking these medications    atenolol 25 MG tablet Commonly known as: Tenormin   atorvastatin 10 MG tablet Commonly known as: LIPITOR   cloNIDine 0.1 MG tablet Commonly known as: CATAPRES       TAKE these medications    acetaminophen 325 MG tablet Commonly known as: TYLENOL Take 650 mg by mouth every 8 (eight) hours.   acyclovir 200 MG capsule Commonly known as: ZOVIRAX Take 200 mg by mouth in the morning. (0800)   anastrozole 1 MG tablet Commonly known as: ARIMIDEX TAKE 1 TABLET BY MOUTH DAILY   BD AutoShield Duo 30G X 5 MM Misc Generic drug: Insulin Pen Needle by Does not apply route. 3/16"   Calcium Acetate 667 MG Tabs Take 2 tablets by mouth 3 (three) times daily with meals. 1 tablet daily w/ snack   calcium-vitamin D 500-200 MG-UNIT tablet Commonly known as: OSCAL WITH D Take  1 tablet by mouth in the morning. (0800)   docusate sodium 100 MG capsule Commonly known as: COLACE Take 100 mg by mouth 2 (two) times daily.   feeding supplement (PRO-STAT SUGAR FREE 64) Liqd Take 30 mLs by mouth 3 (three) times daily with meals. (0800, 1200 & 1800)   melatonin 3 MG Tabs tablet Take 6 mg by mouth at bedtime. (2000) For Sleep   multivitamin Tabs tablet Take 1 tablet by mouth at bedtime. (2100)   ondansetron 4 MG  tablet Commonly known as: ZOFRAN Take 1 tablet (4 mg total) by mouth every 6 (six) hours as needed for nausea.   pantoprazole 40 MG tablet Commonly known as: PROTONIX Take 40 mg by mouth 2 (two) times daily.   sertraline 25 MG tablet Commonly known as: ZOLOFT Take 25 mg by mouth daily. At 9 am along with 50 mg tablet for a total of 75 mg daily   sertraline 50 MG tablet Commonly known as: ZOLOFT Take 50 mg by mouth daily. At 9 am along with 25 mg tablet for a total of 75 mg daily   sucralfate 1 GM/10ML suspension Commonly known as: CARAFATE Take 1 g by mouth in the morning, at noon, in the evening, and at bedtime. (0800, 1200, 1700 & 2100)               Discharge Care Instructions  (From admission, onward)           Start     Ordered   10/03/21 0000  Discharge wound care:       Comments: Protective Foam Dressing to [place]   10/03/21 1510            Major procedures and Radiology Reports - PLEASE review detailed and final reports for all details, in brief -   ECHOCARDIOGRAM COMPLETE  Result Date: 10/02/2021    ECHOCARDIOGRAM REPORT   Patient Name:   Carly Wood Date of Exam: 10/02/2021 Medical Rec #:  [id number]      Height:       63.0 in Accession #:    [id number]     Weight:       146.4 lb Date of Birth:  [birth year]     BSA:          1.693 m Patient Age:    42 years       BP:           129/59 mmHg Patient Gender: F              HR:           66 bpm. Exam Location:  [facility] Procedure: 2D Echo, Cardiac Doppler and Color Doppler Indications:    Syncope  History:        Patient has no prior history of Echocardiogram examinations.                 Signs/Symptoms:Syncope; Risk Factors:Diabetes, Hypertension and                 Dyslipidemia. Breast CA, Multiple myleoma, CKD on Dialysis.  Sonographer:    Wenda Low Referring Phys: [place]  1. Left ventricular ejection fraction, by estimation, is 70 to 75%. The left ventricle has  hyperdynamic function. The left ventricle has no regional wall motion abnormalities. There is moderate asymmetric left ventricular hypertrophy of the basal-septal  segment. Left ventricular diastolic parameters are consistent with Grade I diastolic dysfunction (impaired relaxation).  2. Right  ventricular systolic function is normal. The right ventricular size is normal. There is normal pulmonary artery systolic pressure. The estimated right ventricular systolic pressure is 20.2 mmHg.  3. Left atrial size was mildly dilated.  4. The mitral valve is degenerative. Trivial mitral valve regurgitation. No evidence of mitral stenosis. Moderate mitral annular calcification.  5. The aortic valve was not well visualized. Aortic valve regurgitation is not visualized. Mild aortic valve stenosis.  6. The inferior vena cava is normal in size with greater than 50% respiratory variability, suggesting right atrial pressure of 3 mmHg. FINDINGS  Left Ventricle: Left ventricular ejection fraction, by estimation, is 70 to 75%. The left ventricle has hyperdynamic function. The left ventricle has no regional wall motion abnormalities. The left ventricular internal cavity size was small. There is moderate asymmetric left ventricular hypertrophy of the basal-septal segment. Left ventricular diastolic parameters are consistent with Grade I diastolic dysfunction (impaired relaxation). Right Ventricle: The right ventricular size is normal. No increase in right ventricular wall thickness. Right ventricular systolic function is normal. There is normal pulmonary artery systolic pressure. The tricuspid regurgitant velocity is 2.14 m/s, and  with an assumed right atrial pressure of 3 mmHg, the estimated right ventricular systolic pressure is 54.2 mmHg. Left Atrium: Left atrial size was mildly dilated. Right Atrium: Right atrial size was normal in size. Pericardium: There is no evidence of pericardial effusion. Mitral Valve: The mitral valve is  degenerative in appearance. Moderate mitral annular calcification. Trivial mitral valve regurgitation. No evidence of mitral valve stenosis. MV peak gradient, 5.4 mmHg. The mean mitral valve gradient is 1.0 mmHg. Tricuspid Valve: The tricuspid valve is normal in structure. Tricuspid valve regurgitation is trivial. Aortic Valve: The aortic valve was not well visualized. Aortic valve regurgitation is not visualized. Mild aortic stenosis is present. Aortic valve mean gradient measures 7.5 mmHg. Aortic valve peak gradient measures 14.9 mmHg. Aortic valve area, by VTI measures 1.91 cm. Pulmonic Valve: The pulmonic valve was not well visualized. Pulmonic valve regurgitation is not visualized. Aorta: The aortic root and ascending aorta are structurally normal, with no evidence of dilitation. Venous: The inferior vena cava is normal in size with greater than 50% respiratory variability, suggesting right atrial pressure of 3 mmHg. IAS/Shunts: The interatrial septum was not well visualized.  LEFT VENTRICLE PLAX 2D LVIDd:         3.60 cm   Diastology LVIDs:         2.60 cm   LV e' medial:    3.26 cm/s LV PW:         0.90 cm   LV E/e' medial:  16.2 LV IVS:        1.10 cm   LV e' lateral:   6.42 cm/s LVOT diam:     2.00 cm   LV E/e' lateral: 8.2 LV SV:         80 LV SV Index:   47 LVOT Area:     3.14 cm  RIGHT VENTRICLE RV Basal diam:  2.95 cm RV Mid diam:    2.40 cm RV S prime:     13.10 cm/s TAPSE (M-mode): 2.6 cm LEFT ATRIUM             Index        RIGHT ATRIUM           Index LA diam:        2.70 cm 1.59 cm/m   RA Area:     15.20 cm LA Vol (A2C):  66.1 ml 39.03 ml/m  RA Volume:   40.30 ml  23.80 ml/m LA Vol (A4C):   65.6 ml 38.74 ml/m LA Biplane Vol: 68.4 ml 40.39 ml/m  AORTIC VALVE                     PULMONIC VALVE AV Area (Vmax):    1.91 cm      PV Vmax:       0.74 m/s AV Area (Vmean):   1.85 cm      PV Peak grad:  2.2 mmHg AV Area (VTI):     1.91 cm AV Vmax:           193.00 cm/s AV Vmean:          122.000  cm/s AV VTI:            0.418 m AV Peak Grad:      14.9 mmHg AV Mean Grad:      7.5 mmHg LVOT Vmax:         117.50 cm/s LVOT Vmean:        71.900 cm/s LVOT VTI:          0.254 m LVOT/AV VTI ratio: 0.61  AORTA Ao Root diam: 3.50 cm Ao Asc diam:  3.00 cm MITRAL VALVE               TRICUSPID VALVE MV Area (PHT): 1.95 cm    TR Peak grad:   18.3 mmHg MV Area VTI:   2.81 cm    TR Vmax:        214.00 cm/s MV Peak grad:  5.4 mmHg MV Mean grad:  1.0 mmHg    SHUNTS MV Vmax:       1.16 m/s    Systemic VTI:  0.25 m MV Vmean:      54.7 cm/s   Systemic Diam: 2.00 cm MV Decel Time: 389 msec MV E velocity: 52.70 cm/s MV A velocity: 88.70 cm/s MV E/A ratio:  0.59 Oswaldo Milian MD Electronically signed by Oswaldo Milian MD Signature Date/Time: 10/02/2021/4:41:07 PM    Final    CT CHEST ABDOMEN PELVIS W CONTRAST  Result Date: 10/01/2021 CLINICAL DATA:  Concern for aspiration pneumonia. Abnormal liver labs. Confusion. EXAM: CT CHEST, ABDOMEN, AND PELVIS WITH CONTRAST TECHNIQUE: Multidetector CT imaging of the chest, abdomen and pelvis was performed following the standard protocol during bolus administration of intravenous contrast. RADIATION DOSE REDUCTION: This exam was performed according to the departmental dose-optimization program which includes automated exposure control, adjustment of the mA and/or kV according to patient size and/or use of iterative reconstruction technique. CONTRAST:  130m OMNIPAQUE IOHEXOL 300 MG/ML  SOLN COMPARISON:  Ultrasound 10/01/2021.  Radiography 10/01/2021. FINDINGS: CT CHEST FINDINGS Cardiovascular: Heart size is normal. Extensive coronary artery calcification. Aortic atherosclerotic calcification. Aortic valve region calcification. Mediastinum/Nodes: No mediastinal or hilar mass or lymphadenopathy. Small hiatal hernia. Lungs/Pleura: The lung parenchyma is clear. No infiltrate or collapse. Small area of scarring in the medial left lower lobe. Musculoskeletal: Curvature in chronic  degenerative change of the spine. Bridging thoracic osteophytes. Acute appearing superior endplate fracture at TG01 L1 and L2. CT ABDOMEN PELVIS FINDINGS Hepatobiliary: Liver parenchyma is normal. Multiple stones within the gallbladder. No CT sign of cholecystitis or obstruction. Pancreas: 1 x 2 cm cystic abnormality in the ventral head of the pancreas. Spleen: Normal Adrenals/Urinary Tract: Adrenal glands are normal. Renal cortical atrophy with multiple cysts. Extensive renovascular calcification including a 1 cm aneurysm in the right renal  hilum. Nonobstructing 5 mm stone in the lower pole of the left kidney. Exophytic abnormality at the inferior pole of the right kidney is indeterminate for cyst versus mass, measuring up to 2.7 cm in size. Bladder is normal. Stomach/Bowel: Small hiatal hernia. Stomach otherwise normal. Small bowel pattern is normal. No sign of appendicitis. Mild diverticulosis of the colon without visible diverticulitis. Vascular/Lymphatic: Aortic atherosclerosis. No aneurysm. IVC is normal. No adenopathy. Reproductive: Multiple calcified leiomyomas of the uterus. No other pelvic masses seen. Other: No free fluid or air. Musculoskeletal: As noted above, probably acute superior endplate fractures at X32, L1 and L2. Lower lumbar degenerative changes with anterolisthesis at L4-5 and probable stenosis at this level. Previous gamma nail fixation of a right femur fracture. Probable Paget's disease within the right hemipelvis. IMPRESSION: No aspiration pneumonia. Mild focal scarring at the medial left lung base. Coronary artery calcification. Aortic atherosclerotic calcification. Aortic Atherosclerosis (ICD10-I70.0). Probably acute superior endplate fractures at G40, L1 and L2. No retropulsed bone. Renal atrophy. Multiple renal cysts. Indeterminate 2.7 cm lesion at the lower pole the right kidney could be a complicated cyst or a mass. This could be evaluated with renal MRI. 1 cm renal artery aneurysm on  the right. Nonobstructing 5 mm stone in the lower pole of the left kidney. 1 x 2 cm cystic abnormality in the ventral head of the pancreas. Best practice recommendation is for reimaging of this every 6 months for 2 years and then every 1 year for 2 years and then every 2 years for 6 years. Follow-up can be discontinued if stable for 10 years. This recommendation could be tailored to this patient's clinical status and life expectancy. Paget's disease of the right hemipelvis. Diverticulosis without evidence of diverticulitis. Small hiatal hernia. Multiple calcified leiomyomas the uterus. Electronically Signed   By: Nelson Chimes M.D.   On: 10/01/2021 14:44   US Abdomen Limited RUQ (LIVER/GB)  Result Date: 10/01/2021 CLINICAL DATA:  Vomiting, found unresponsive EXAM: ULTRASOUND ABDOMEN LIMITED RIGHT UPPER QUADRANT COMPARISON:  None Available. FINDINGS: Gallbladder: No wall thickening. Gallstones are present, largest measures 2.4 cm. No sonographic Murphy sign noted by sonographer. Common bile duct: Diameter: 2.4 mm Liver: No focal lesion identified. Increased parenchymal echogenicity. Portal vein is patent on color Doppler imaging with normal direction of blood flow towards the liver. Other: Evaluation somewhat limited due to patient positioning. IMPRESSION: 1. Cholelithiasis with no evidence of acute cholecystitis. 2. Increased echogenicity of the liver parenchyma, findings suggestive of hepatic steatosis. Electronically Signed   By: Yetta Glassman M.D.   On: 10/01/2021 12:08   DG Chest Portable 1 View  Result Date: 10/01/2021 CLINICAL DATA:  Possible aspiration. EXAM: PORTABLE CHEST 1 VIEW COMPARISON:  Radiographs 07/23/2020 and 07/14/2020. FINDINGS: 0957 hours. Left IJ hemodialysis catheter projects to the level of the upper right atrium. The heart size and mediastinal contours are stable with aortic atherosclerosis. There is mild atelectasis or scarring at both lung bases, but no confluent airspace  opacity, edema, pneumothorax or significant pleural effusion. Small surgical clips are present in the right axilla. No acute osseous findings are evident. Telemetry leads overlie the chest. IMPRESSION: No evidence of active cardiopulmonary process. Electronically Signed   By: Richardean Sale M.D.   On: 10/01/2021 10:10    Micro Results   No results found for this or any previous visit (from the past 240 hour(s)).  Today   Subjective    Carly Wood today has no new complaints  No fever  Or chills  -  No chest pains, no palpitations, no dizziness and no shortness of breath No Nausea, Vomiting or Diarrhea   Patient has been seen and examined prior to discharge   Objective   Blood pressure (!) 99/49, pulse 62, temperature 98.3 F (36.8 C), resp. rate 18, height 5' 3" (1.6 m), weight 64.2 kg, SpO2 99 %.   Intake/Output Summary (Last 24 hours) at 10/03/2021 1511 Last data filed at 10/03/2021 1225 Gross per 24 hour  Intake 600 ml  Output --  Net 600 ml    Exam Gen:- Awake Alert,  in no apparent distress  HEENT:- [place].AT, No sclera icterus Neck-Supple Neck,No JVD,. ,  Left IJ hemodialysis catheter site is clean dry intact Lungs-  CTAB , fair symmetrical air movement CV- S1, S2 normal, regular  Abd-  +ve B.Sounds, Abd Soft, No tenderness,    Extremity/Skin:- No  edema, pedal pulses present  Psych-affect is appropriate, oriented x3 Neuro-generalized weakness, no new focal deficits, no tremors MSK- Lt UE AV fistula -Per patient currently nonfunctional   Data Review   CBC w Diff:  Lab Results  Component Value Date   WBC 7.2 10/03/2021   HGB 8.6 (L) 10/03/2021   HCT 27.5 (L) 10/03/2021   PLT 179 10/03/2021   LYMPHOPCT 6 10/01/2021   MONOPCT 5 10/01/2021   EOSPCT 0 10/01/2021   BASOPCT 0 10/01/2021    CMP:  Lab Results  Component Value Date   NA 135 10/03/2021   K 3.6 10/03/2021   CL 96 (L) 10/03/2021   CO2 28 10/03/2021   BUN 34 (H) 10/03/2021   CREATININE 7.04 (H)  10/03/2021   PROT 6.5 10/03/2021   ALBUMIN 3.5 10/03/2021   BILITOT 0.4 10/03/2021   ALKPHOS 95 10/03/2021   AST 45 (H) 10/03/2021   ALT 99 (H) 10/03/2021  .  Total Discharge time is about 33 minutes  Roxan Hockey M.D on 10/03/2021 at 3:11 PM  Go to [email or web address] -  for contact info  [facility] - Office  [phone]

## 2021-10-03 NOTE — Progress Notes (Signed)
Received patient in bed, alert and oriented. Informed consent signed and in chart.  Tx duration: 3  HD treatment completed. Patient tolerated well. Fistula/Graft/HD catheter without signs and symptoms of complications. Patient transported back to the room, alert and orient and in no acute distress. Report given to bedside RN.  Total UF removed:1400 ml  Medication given:none  Post HD VS:140/60. HR 64. R 18  Post HD weight: 63kg

## 2021-10-03 NOTE — Care Management CC44 (Signed)
Condition Code 44 Documentation Completed  Patient Details  Name: MARIKA MAHAFFY MRN: [id number] Date of Birth: [birth year]   Condition Code 44 given:  Yes Patient signature on Condition Code 44 notice:  Yes Documentation of 2 MD's agreement:  Yes Code 44 added to claim:  Yes    Boneta Lucks, RN 10/03/2021, 12:36 PM

## 2021-10-03 NOTE — Discharge Instructions (Signed)
1)Very low-salt diet advised 2)Weigh yourself daily, call if you gain more than 3 pounds in 1 day or more than 5 pounds in 1 week as your hemodialysis dry weight may need to be adjusted 3)Limit your Fluid  intake to no more than 60 ounces (1.8 Liters) per day 4)Stop Clonidine and Stop Atenolol due to soft blood pressure 5)Stop Lipitor/Atorvastatin due to elevated liver function test...  6)Repeat complete metabolic profile including liver function test and CBC blood test within the next 5 to 7 days

## 2021-10-03 NOTE — Progress Notes (Signed)
[facility] Progress Note   Assessment/ Plan:   Assessment/Plan: 1 Syncope/vomiting: appears back to baseline.  Doubt CVA, Ct abd/ pelvis without acute pathology.  Trops x 2.  Per primary. 2 ESRD: MWF normally- HD off schedule today and then back to normal MWF schedule today. 3 Hypertension:  reasonably well controlled 4. Anemia of ESRD: Hgb 8.3, will give ESA 5. Metabolic Bone Disease: binders as she eats 6.  Elevated LFTs: holding lipitor--> she is also on acyclovir, presumably for primary prevention of HSV/ VZV while on velcade.  Can cause elevated LFTs--> consider holding for now 7.  Multiple myeloma: on Velcade 8.  Dispo: inpt  Subjective:    Seen in room.  Resting and feeling OK.  Possible discharge today.     Objective:   BP (!) 112/46 (BP Location: Right Arm)   Pulse 75   Temp 98.2 F (36.8 C)   Resp 16   Ht _0  (1.6 m)   Wt 64.2 kg   SpO2 100%   BMI 25.07 kg/m   Physical Exam: GEN  older woman, NAD, sitting in bed HEENT EOMI PERRL NECK no JVD PULM clear  CV RRR no m/r/g ABD soft, nontender EXT no LE edema NEURO AAO x 3 nonfocal ACCESS L IJ [facility]   Labs: BMET Recent Labs  Lab 09/30/21 1216 10/01/21 1032 10/02/21 0615 10/02/21 0840 10/03/21 0602  NA 136 135 137 134* 135  K 4.0 3.7 3.9 4.0 3.6  CL 96* 95* 97* 96* 96*  CO2 _1 GLUCOSE 222* 198* 86 167* 105*  BUN 40* 59* 66* 69* 34*  CREATININE 6.87* 9.01* 10.58* 11.15* 7.04*  CALCIUM 9.2 9.6 9.2 8.9 8.8*  PHOS  --   --   --  3.6  --    CBC Recent Labs  Lab 09/30/21 1216 10/01/21 1032 10/02/21 0615 10/02/21 0840 10/03/21 0602  WBC 6.6 13.0* 7.8 7.8 7.2  NEUTROABS 4.9 11.6*  --   --   --   HGB 9.6* 9.1* 8.6* 8.3* 8.6*  HCT 31.1* 28.8* 27.1* 26.5* 27.5*  MCV 101.3* 99.3 100.0 100.4* 99.6  PLT 232 251 180 191 179      Medications:     acyclovir  200 mg Oral Daily   anastrozole  1 mg Oral Daily   atenolol  25 mg Oral Daily   Chlorhexidine Gluconate Cloth  6  each Topical Daily   Chlorhexidine Gluconate Cloth  6 each Topical Q0600   cloNIDine  0.1 mg Oral QHS   heparin  5,000 Units Subcutaneous Q8H   melatonin  6 mg Oral QHS   pantoprazole  40 mg Oral BID   sertraline  75 mg Oral Daily     Madelon Lips MD 10/03/2021, 9:58 AM

## 2021-10-03 NOTE — Care Management Obs Status (Signed)
[place] NOTIFICATION   Patient Details  Name: MONIE SHERE MRN: [id number] Date of Birth: Nov 07, 1935   Medicare Observation Status Notification Given:  Yes    Boneta Lucks, RN 10/03/2021, 12:35 PM

## 2021-10-03 NOTE — TOC Transition Note (Signed)
Transition of Care [facility]) - CM/SW Discharge Note   Patient Details  Name: Carly Wood MRN: [id number] Date of Birth: 09/26/1935  [facility] CM/SW Contact:  Boneta Lucks, RN Phone Number: 10/03/2021, 12:39 PM   Clinical Narrative:   Patient will discharge back to [facility] after dialysis. It will be 6:30pm, Confirmed with Marianna Fuss she can return this evening. This will need DC summary sooner. MD aware.    Final next level of care: Long Term Acute Care (LTAC) Barriers to Discharge: Barriers Resolved   Patient Goals and CMS Choice Patient states their goals for this hospitalization and ongoing recovery are:: to return to [facility]. CMS Medicare.gov Compare Post Acute Care list provided to:: Patient Represenative (must comment)    Discharge Placement                Patient to be transferred to facility by: [facility] staff Name of family member notified: sister- Deneise Lever Patient and family notified of of transfer: 10/03/21  Discharge Plan and [place]  Readmission Risk Interventions    10/03/2021   12:36 PM 07/25/2020    2:09 PM 07/23/2020   10:59 AM  Readmission Risk Prevention Plan  Transportation Screening Complete Complete Complete  PCP or Specialist Appt within 3-5 Days Complete    HRI or [place] Complete    Social Work Consult for [place] Planning/Counseling Complete    Palliative Care Screening Complete    Medication Review Press photographer) Complete  Complete  HRI or Home Care Consult   Complete  Palliative Care Screening   Not Applicable  Skilled Nursing Facility   Complete

## 2021-10-04 DIAGNOSIS — R1312 Dysphagia, oropharyngeal phase: Secondary | ICD-10-CM | POA: Diagnosis not present

## 2021-10-04 DIAGNOSIS — I120 Hypertensive chronic kidney disease with stage 5 chronic kidney disease or end stage renal disease: Secondary | ICD-10-CM | POA: Diagnosis not present

## 2021-10-06 ENCOUNTER — Encounter: Payer: Self-pay | Admitting: Adult Health

## 2021-10-06 ENCOUNTER — Non-Acute Institutional Stay (SKILLED_NURSING_FACILITY): Payer: Medicare PPO | Admitting: Adult Health

## 2021-10-06 DIAGNOSIS — E1122 Type 2 diabetes mellitus with diabetic chronic kidney disease: Secondary | ICD-10-CM | POA: Diagnosis not present

## 2021-10-06 DIAGNOSIS — E1169 Type 2 diabetes mellitus with other specified complication: Secondary | ICD-10-CM

## 2021-10-06 DIAGNOSIS — C50911 Malignant neoplasm of unspecified site of right female breast: Secondary | ICD-10-CM

## 2021-10-06 DIAGNOSIS — R7989 Other specified abnormal findings of blood chemistry: Secondary | ICD-10-CM

## 2021-10-06 DIAGNOSIS — N2581 Secondary hyperparathyroidism of renal origin: Secondary | ICD-10-CM | POA: Diagnosis not present

## 2021-10-06 DIAGNOSIS — K219 Gastro-esophageal reflux disease without esophagitis: Secondary | ICD-10-CM | POA: Diagnosis not present

## 2021-10-06 DIAGNOSIS — N186 End stage renal disease: Secondary | ICD-10-CM | POA: Diagnosis not present

## 2021-10-06 DIAGNOSIS — N250 Renal osteodystrophy: Secondary | ICD-10-CM | POA: Diagnosis not present

## 2021-10-06 DIAGNOSIS — F01518 Vascular dementia, unspecified severity, with other behavioral disturbance: Secondary | ICD-10-CM

## 2021-10-06 DIAGNOSIS — Z992 Dependence on renal dialysis: Secondary | ICD-10-CM

## 2021-10-06 DIAGNOSIS — I120 Hypertensive chronic kidney disease with stage 5 chronic kidney disease or end stage renal disease: Secondary | ICD-10-CM

## 2021-10-06 DIAGNOSIS — I700 Atherosclerosis of aorta: Secondary | ICD-10-CM | POA: Diagnosis not present

## 2021-10-06 DIAGNOSIS — D631 Anemia in chronic kidney disease: Secondary | ICD-10-CM

## 2021-10-06 DIAGNOSIS — I70209 Unspecified atherosclerosis of native arteries of extremities, unspecified extremity: Secondary | ICD-10-CM | POA: Diagnosis not present

## 2021-10-06 DIAGNOSIS — E785 Hyperlipidemia, unspecified: Secondary | ICD-10-CM

## 2021-10-06 DIAGNOSIS — E559 Vitamin D deficiency, unspecified: Secondary | ICD-10-CM | POA: Diagnosis not present

## 2021-10-06 DIAGNOSIS — F339 Major depressive disorder, recurrent, unspecified: Secondary | ICD-10-CM

## 2021-10-06 LAB — PROTEIN ELECTROPHORESIS, SERUM
A/G Ratio: 1.4 (ref 0.7–1.7)
Albumin ELP: 3.6 g/dL (ref 2.9–4.4)
Alpha-1-Globulin: 0.2 g/dL (ref 0.0–0.4)
Alpha-2-Globulin: 0.8 g/dL (ref 0.4–1.0)
Beta Globulin: 1 g/dL (ref 0.7–1.3)
Gamma Globulin: 0.6 g/dL (ref 0.4–1.8)
Globulin, Total: 2.6 g/dL (ref 2.2–3.9)
Total Protein ELP: 6.2 g/dL (ref 6.0–8.5)

## 2021-10-06 NOTE — Progress Notes (Signed)
Location:  [facility] Room Number: S/148/D Place of Service:  SNF (31) [name], [name], NP  CODE STATUS: DNR  Allergies  Allergen Reactions   Ace Inhibitors Other (See Comments) and Cough    Tongue swell , ie angioedema   Angiotensin Receptor Blockers     Angioedema with ACE-I   Penicillins Other (See Comments)    Unsure of reaction Has patient had a PCN reaction causing immediate rash, facial/tongue/throat swelling, SOB or lightheadedness with hypotension: Unknown Has patient had a PCN reaction causing severe rash involving mucus membranes or skin necrosis: Unknown Has patient had a PCN reaction that required hospitalization: No Has patient had a PCN reaction occurring within the last 10 years: Unknown If all of the above answers are "NO", then may proceed with Cephalosporin use.     Penicillin G     Chief Complaint  Patient presents with   Hospitalization Follow-up    Patient is here for follow up after hospital stay     HPI:   She is a 86 year old long term resident of this facility who has been hospitalized from 10-01-21 through 10-03-21. Her medical history includes: ESRD; multiple myeloma; diabetes; dementia. She presented to the ED after she vomited and had a syncopal episode. Upon EMS arrival she was able to follow commands. She vomited once again the ED. There are concern that she may aspirated and may have possibly had a CVA.  She did have elevated liver enzymes her ct scan did not demonstrate aspiration; did show: renal atrophy with cysts present.  Syncope: no focal neuro deficits; her troponin was elevated had an 2-D with an EF of 70-75%. She has had soft blood pressure her tenormin and clonidine were stopped.  Elevated liver enzymes the ast and alt are trending down. Her hepatitis panel is negative. Stopped her lipitor discussed with Dr. Arnoldo Morale (general surgery) no surgery indicated at this time for her cholelithiasis  She will continue to be  followed for her chronic illnesses including:   Stage 1 infiltrating ductal carcinoma of female right breast/status post right mastectomy:  Multiple myeloma not having achieved remission:   Herpes:  Major depression chronic recurrent:       Past Medical History:  Diagnosis Date   Anemia     chronic macrocytic anemia   Anxiety    Chronic kidney disease    Chronic renal disease, stage 4, severely decreased glomerular filtration rate (GFR) between 15-29 mL/min/1.73 square meter ([place]) 49/17/9150   Complication of anesthesia    delirious after Breast Surgery   Dementia ([place])    mild   Depression    Diabetes mellitus with ESRD (end-stage renal disease) ([place])    type II   Dysphagia    Dyspnea    with activity   GERD (gastroesophageal reflux disease)    Glaucoma    Hyperlipidemia    Hypertension    Pneumonia    Stage 1 infiltrating ductal carcinoma of right female breast ([place]) 08/21/2015   ER+ PR+ HER 2 neu + (3+) T1cN0     Past Surgical History:  Procedure Laterality Date   A/V FISTULAGRAM Right 07/05/2020   Procedure: A/V Fistulagram;  Surgeon: Elam Dutch, MD;  Location: [facility];  Service: Cardiovascular;  Laterality: Right;   AV FISTULA PLACEMENT Left 11/22/2017   Procedure: ARTERIOVENOUS (AV) FISTULA CREATION LEFT ARM;  Surgeon: Elam Dutch, MD;  Location: [facility];  Service: Vascular;  Laterality: Left;  AV FISTULA PLACEMENT Right 04/04/2020   Procedure: RIGHT ARM ARTERIOVENOUS FISTULA CREATION;  Surgeon: Rosetta Posner, MD;  Location: [facility];  Service: Vascular;  Laterality: Right;   AV FISTULA PLACEMENT Right 08/20/2020   Procedure: ARTERIOVENOUS (AV) FISTULA LIGATION RIGHT ARM;  Surgeon: Elam Dutch, MD;  Location[facility];  Service: Vascular;  Laterality: Right;   BIOPSY  08/07/2016   Procedure: BIOPSY;  Surgeon: Daneil Dolin, MD;  [facility];  Service: Endoscopy;;  gastric ulcer biopsy   COLONOSCOPY     ESOPHAGOGASTRODUODENOSCOPY N/A  08/07/2016   LA Grade A esophagitis s/p dilation, small hiatal hernia, multiple gastric ulcers and erosions, duodenal erosions s/p biopsy. Negative H.pylori    ESOPHAGOGASTRODUODENOSCOPY N/A 11/27/2016   normal esophagus, previously noted gastric ulcers completely healed, normal duodenum.    ESOPHAGOGASTRODUODENOSCOPY (EGD) WITH PROPOFOL N/A 07/23/2020   Procedure: ESOPHAGOGASTRODUODENOSCOPY (EGD) WITH PROPOFOL;  Surgeon: Rogene Houston, MD;  [facility];  Service: Endoscopy;  Laterality: N/A;   FISTULA SUPERFICIALIZATION Left 02/14/2018   Procedure: FISTULA SUPERFICIALIZATION LEFT ARM;  Surgeon: Angelia Mould, MD;  Location[facility];  Service: Vascular;  Laterality: Left;   FRACTURE SURGERY Right    ankle   HOT HEMOSTASIS  07/23/2020   Procedure: HOT HEMOSTASIS (ARGON PLASMA COAGULATION/BICAP);  Surgeon: Rogene Houston, MD;  [facility];  Service: Endoscopy;;   INTRAMEDULLARY (IM) NAIL INTERTROCHANTERIC Right 07/12/2020   Procedure: INTRAMEDULLARY (IM) NAIL INTERTROCHANTRIC;  Surgeon: Mordecai Rasmussen, MD;  Location: [facility];  Service: Orthopedics;  Laterality: Right;   MALONEY DILATION N/A 08/07/2016   Procedure: Venia Minks DILATION;  Surgeon: Daneil Dolin, MD;  [facility];  Service: Endoscopy;  Laterality: N/A;   MASTECTOMY, PARTIAL Right    multiple myeloma     PERIPHERAL VASCULAR BALLOON ANGIOPLASTY Left 07/13/2019   Procedure: PERIPHERAL VASCULAR BALLOON ANGIOPLASTY;  Surgeon: Marty Heck, MD;  Location: [facility];  Service: Cardiovascular;  Laterality: Left;  arm fistulogram   PERIPHERAL VASCULAR BALLOON ANGIOPLASTY Right 05/22/2020   Procedure: PERIPHERAL VASCULAR BALLOON ANGIOPLASTY;  Surgeon: Cherre Robins, MD;  Location: [facility];  Service: Cardiovascular;  Laterality: Right;  arm fistula   PERIPHERAL VASCULAR BALLOON ANGIOPLASTY Right 07/05/2020   Procedure: PERIPHERAL VASCULAR BALLOON ANGIOPLASTY;  Surgeon: Elam Dutch, MD;  Location: [facility];  Service: Cardiovascular;  Laterality: Right;  arm fistula   PORT-A-CATH REMOVAL Left 11/22/2017   Procedure: REMOVAL PORT-A-CATH LEFT CHEST;  Surgeon: Elam Dutch, MD;  Location: [facility];  Service: Vascular;  Laterality: Left;   RETINAL DETACHMENT SURGERY Right    SCLEROTHERAPY  07/23/2020   Procedure: SCLEROTHERAPY;  Surgeon: Rogene Houston, MD;  [facility];  Service: Endoscopy;;   UPPER EXTREMITY VENOGRAPHY N/A 08/22/2020   Procedure: LEFT UPPER & CENTRAL VENOGRAPHY;  Surgeon: Marty Heck, MD;  Location: [facility];  Service: Cardiovascular;  Laterality: N/A;    Social History   Socioeconomic History   Marital status: Single    Spouse name: Not on file   Number of children: Not on file   Years of education: Not on file   Highest education level: Not on file  Occupational History   Occupation: retired   Tobacco Use   Smoking status: Never   Smokeless tobacco: Never  Vaping Use   Vaping Use: Never used  Substance and Sexual Activity   Alcohol use: No    Alcohol/week:  0.0 standard drinks of alcohol   Drug use: No   Sexual activity: Never  Other Topics Concern   Not on file  Social History Narrative   Long term resident of SNF    Social Determinants of Health   Financial Resource Strain: Low Risk  (01/09/2020)   Overall Financial Resource Strain (CARDIA)    Difficulty of Paying Living Expenses: Not very hard  Food Insecurity: No Food Insecurity (01/09/2020)   Hunger Vital Sign    Worried About Running Out of Food in the Last Year: Never true    Ran Out of Food in the Last Year: Never true  Transportation Needs: No Transportation Needs (01/09/2020)   PRAPARE - Hydrologist (Medical): No    Lack of Transportation (Non-Medical): No  Physical Activity: Inactive (01/09/2020)   Exercise Vital Sign    Days of Exercise per Week: 0 days    Minutes of Exercise per Session: 0  min  Stress: No Stress Concern Present (01/09/2020)   Groom    Feeling of Stress : Not at all  Social Connections: Moderately Isolated (01/09/2020)   Social Connection and Isolation Panel [NHANES]    Frequency of Communication with Friends and Family: More than three times a week    Frequency of Social Gatherings with Friends and Family: Once a week    Attends Religious Services: More than 4 times per year    Active Member of [employer] or Organizations: No    Attends Archivist Meetings: Never    Marital Status: Never married  Intimate Partner Violence: Not At Risk (01/09/2020)   Humiliation, Afraid, Rape, and Kick questionnaire    Fear of Current or Ex-Partner: No    Emotionally Abused: No    Physically Abused: No    Sexually Abused: No   Family History  Problem Relation Age of Onset   Multiple myeloma Sister    Brain cancer Sister    Dementia Mother        died at 75   Stroke Mother    Heart failure Mother    Diabetes Mother    Heart disease Father    Prostate cancer Brother    Colon cancer Neg Hx       VITAL SIGNS BP 124/72   Pulse 75   Temp 98.1 F (36.7 C)   Resp 20   Ht '5\' 3"'  (1.6 m)   Wt 142 lb 3.2 oz (64.5 kg)   SpO2 93%   BMI 25.19 kg/m   Outpatient Encounter Medications as of 10/06/2021  Medication Sig   acetaminophen (TYLENOL) 325 MG tablet Take 650 mg by mouth every 8 (eight) hours.   acyclovir (ZOVIRAX) 200 MG capsule Take 200 mg by mouth in the morning. (0800)   Amino Acids-Protein Hydrolys (FEEDING SUPPLEMENT, PRO-STAT SUGAR FREE 64,) LIQD Take 30 mLs by mouth 3 (three) times daily with meals. (0800, 1200 & 1800)   anastrozole (ARIMIDEX) 1 MG tablet TAKE 1 TABLET BY MOUTH DAILY   Calcium Acetate 667 MG TABS Take 2 tablets by mouth 3 (three) times daily with meals. 1 tablet daily w/ snack   calcium-vitamin D (OSCAL WITH D) 500-200 MG-UNIT tablet Take 1 tablet by mouth in the  morning. (0800)   docusate sodium (COLACE) 100 MG capsule Take 100 mg by mouth 2 (two) times daily.   Insulin Pen Needle (BD AUTOSHIELD DUO) 30G X 5 MM MISC by Does not  apply route. 3/16"   melatonin 3 MG TABS tablet Take 6 mg by mouth at bedtime. (2000) For Sleep   multivitamin (RENA-VIT) TABS tablet Take 1 tablet by mouth at bedtime. (2100)   ondansetron (ZOFRAN) 4 MG tablet Take 1 tablet (4 mg total) by mouth every 6 (six) hours as needed for nausea.   pantoprazole (PROTONIX) 40 MG tablet Take 40 mg by mouth 2 (two) times daily.   sertraline (ZOLOFT) 25 MG tablet Take 25 mg by mouth daily. At 9 am along with 50 mg tablet for a total of 75 mg daily   sertraline (ZOLOFT) 50 MG tablet Take 50 mg by mouth daily. At 9 am along with 25 mg tablet for a total of 75 mg daily   sucralfate (CARAFATE) 1 GM/10ML suspension Take 1 g by mouth in the morning, at noon, in the evening, and at bedtime. (0800, 1200, 1700 & 2100)   Facility-Administered Encounter Medications as of 10/06/2021  Medication   lanreotide acetate (SOMATULINE DEPOT) 120 MG/0.5ML injection   lanreotide acetate (SOMATULINE DEPOT) 120 MG/0.5ML injection   octreotide (SANDOSTATIN LAR) 30 MG IM injection   octreotide (SANDOSTATIN LAR) 30 MG IM injection     SIGNIFICANT DIAGNOSTIC EXAMS  TODAY  10-01-21: chest x-ray: No evidence of active cardiopulmonary process.   10-01-21: abdominal ultrasound:  1. Cholelithiasis with no evidence of acute cholecystitis. 2. Increased echogenicity of the liver parenchyma, findings suggestive of hepatic steatosis.  10-01-21: ct of chest abdomen and pelvis  No aspiration pneumonia. Mild focal scarring at the medial left lung base.   Coronary artery calcification. Aortic atherosclerotic calcification. Aortic Atherosclerosis  Probably acute superior endplate fractures at H70, L1 and L2. No retropulsed bone.   Renal atrophy. Multiple renal cysts. Indeterminate 2.7 cm lesion at the lower pole the right  kidney could be a complicated cyst or a mass. This could be evaluated with renal MRI. 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 Nonobstructing 5 mm stone in the lower pole of the left kidney.   1 x 2 cm cystic abnormality in the ventral head of the pancreas. Best practice recommendation is for reimaging of this every 6 months for 2 years and then every 1 year for 2 years and then every 2 years for 6 years. Follow-up can be discontinued if stable for 10 years. This recommendation could be tailored to this patient's clinical status and life expectancy.  Paget's disease of the right hemipelvis. Diverticulosis without evidence of diverticulitis. Small hiatal hernia. Multiple calcified leiomyomas the uterus.     LABS REVIEWED PREVIOUS   10-29-20: wbc 5.3; hgb 12.1; hct 40.0; mcv 96.6 plt 218; glucose 190; bun 54; creat 7.35 k+ 4.0; na++ 134; ca 9.4; GFR 5 liver normal albumin 3.7; mag 2.3 11-19-20: wbc 10.9; hgb 11.3; hct 37.2; mcv 96.6 plt 242; glucose 106; bun 47; creat 6.58; k+ 3.9; na++ 133; ca 9.0; GFR 6 liver normal albumin 3.5  12-02-20: hgb a1c 5.6; chol 167; ldl 83; trig 229; hdl 38 12-03-20; wbc 5.2; hgb 11.1; hct 35.7; mcv 96.5 plt 320; glucose 139; bun 47; creat 6.05; k+ 4.1; na++ 134; ca 9.1; GFR 6; liver normal albumin 3.8 mag 2.4  12-17-20: wbc 5.2; hgb 10.5; hct 33.8; mcv 98.8 plt 256; glucose 136; bun 43; creat 6.19; k+ 3.6; na++ 137; ca 8.9; GFR 6 liver normal albumin 3.9  03-06-21: wbc 4.7; hgb 12.3; hct 38.2; mcv 101.6 plt 221; glucose 205; bun 51; creat 7.06; k+ 4.5; na++ 133; ca  9.1; GFR 5; liver normal albumin 3.8  03-20-21: wbc 5.4; hgb 12.1; hct 37.7; mcv 97.2 plt 254; glucose 111; bun 47; creat 7.34; k+ 4.6; na++ 131; ca 9.6 GFR 5; liver normal albumin 3.9; mag 2.2 03-27-21: hgb a1c 6.1   04-29-21; wbc 5.4; hgb 11.0; hct 34.6;mcv 100.9 plt 179; glucose 82; bun 44; creat 7.04; k+ 4.6; na++ 139; ca 8.7; GFR 5 d-dimer: 1.25; CRP 4.0   05-27-21: wbc 6.0; hgb 10.6; hct 33.5; mcv 101.2  plt 261; glucose 147; bun 45; creat 6.86; k+ 4.5; na++ 138; ca 8.3; GFR 6; protein 6.9; albumin 3.8  06-12-21: wbc 6.5; hgb 8.8; hct 27.7; mcv 101.5 plt 229; glucose 153; bun 40; creat 6.53; k+ 4.3; na++ 136; ca 9.5; GFR 6 protein 6.5; albumin 3.4 mag 2.2  07-07-21: chol 171; ldl 89; trig 265; hdl 29  07-08-21: wbc 15.7; hgb 9.8; hct 32.8; mcv 104.8; plt 226; glucose 164; bun 51; creat 7.74; k+ 4.4; na++ 138; ca 9.7; gfr 5 protein 7.5; albumin 3.9 mag 2.1; chol 171; ldl 89; trig 265; hdl 29; hgb a1c 6.1 08-12-21: wbc 6.3; hgb 10.0; hct 31.9; mcv 103.9 plt 215; glucose 197; bun 40;creat 6.25; k+ 4.2; na++138; ca 9.3; gfr 6; protein 6.7; albumin 3.7; iron 93; tibc 142; ferritin 312  TODAY  09-30-21: wbc 6.6; hgb 9.6; hct 31.1; mcv 101.3 plt 232; glucose 222; bun 40; creat 6.78; k+ 4.0; na++ 136; ca 9.2; gfr 6; protein 6.8 albumin 3.7  10-01-21: wbc 13.0; hgb 9.1; hct 28.8; mcv 99.3 plt 251; glucose 198; bun 59; creat 9.01; k+ 3.7; na++ 135; ca 9.6; gfr 4; ast 212; alt 173; protein 6.7; albumin 3.6  10-03-21: wbc 7.2; hgb 8.6; hct 27.5; mcv 99.6 plt 179 glucose 105; bun 34; creat 7.04; k+ 3.6; na++ 135; ca 8.8; gfr 5 ast 45; alt 99; protein 6.5; albumin 3.5   Review of Systems  Constitutional:  Negative for malaise/fatigue.  Respiratory:  Negative for cough and shortness of breath.   Cardiovascular:  Negative for chest pain, palpitations and leg swelling.  Gastrointestinal:  Negative for abdominal pain, constipation and heartburn.  Musculoskeletal:  Negative for back pain, joint pain and myalgias.  Skin: Negative.   Neurological:  Negative for dizziness.  Psychiatric/Behavioral:  The patient is not nervous/anxious.     Physical Exam Constitutional:      General: She is not in acute distress.    Appearance: She is well-developed. She is not diaphoretic.  Neck:     Thyroid: No thyromegaly.  Cardiovascular:     Rate and Rhythm: Normal rate and regular rhythm.     Pulses: Normal pulses.     Heart  sounds: Normal heart sounds.  Pulmonary:     Effort: Pulmonary effort is normal. No respiratory distress.     Breath sounds: Normal breath sounds.  Abdominal:     General: Bowel sounds are normal. There is no distension.     Palpations: Abdomen is soft.     Tenderness: There is no abdominal tenderness.  Musculoskeletal:        General: Normal range of motion.     Cervical back: Neck supple.     Right lower leg: No edema.     Left lower leg: No edema.  Lymphadenopathy:     Cervical: No cervical adenopathy.  Skin:    General: Skin is warm and dry.     Comments: Dialysis access chest wall    Neurological:     Mental  Status: She is alert. Mental status is at baseline.  Psychiatric:        Mood and Affect: Mood normal.       ASSESSMENT/ PLAN:  TODAY  Syncope: no neuro focal deficits. Her clonidine and tenormin have been stopped due her soft blood pressure.   2. Stage 1 infiltrating ductal carcinoma of female right breast/status post right mastectomy: will continue arimidex 1 mg daily   3. Multiple myeloma not having achieved remission: is followed by oncology; will continue velcade.   4. Herpes: without outbreak will continue acyclovir 200 mg daily   5. Major depression chronic recurrent: will continue zoloft 75 mg daily   6. Protein calorie malnutrition: albumin 3.5; protein 6.5 will continue prostat with meals and supplements as directed  7. Multiple gastric ulcers; duodenal ulcer; upper GI bleed; esophageal reflux disease without esophagitis will continue protonix 40 mg twice daily carafate 1 gm four times daily   8. History right hip fracture: will continue tylenol 650 mg three times daily   9. Chronic kidney disease with end stage renal disease on dialysis due to type 2 diabetes mellitus/dependence on dialysis: will continue dialysis three days weekly; 1200 cc fluid restriction; calcium acetate 1334 mg with meals and 667 mg with snacks.   10. Hypertension due to end  stage renal disease (ESRD) due to type 2 diabetes mellitus: b/p 124/72 is off tenormin and clonidine will monitor   11. Hyperlipidemia associated with type 2 diabetes mellitus: LDL 89 is off lipitor due to elevated liver enzymes  12. Aortic atherosclerosis/ atherosclerotic peripheral vascular disease (ct 10/04/18) ldl 89   13. Vascular dementia with behavioral disturbance: weight is 142 pounds  14. Diabetes mellitus type 2 with end stage renal disease (ESRD) hgb a1c 6.1 is off lantus   15. Anemia due to end stage renal disease: hgb 8.6 will monitor   Will check CMP hgb a1c   Ok Edwards NP [facility]  call [phone]

## 2021-10-07 ENCOUNTER — Other Ambulatory Visit: Payer: Self-pay

## 2021-10-08 DIAGNOSIS — N186 End stage renal disease: Secondary | ICD-10-CM | POA: Diagnosis not present

## 2021-10-08 DIAGNOSIS — Z992 Dependence on renal dialysis: Secondary | ICD-10-CM | POA: Diagnosis not present

## 2021-10-09 ENCOUNTER — Other Ambulatory Visit (HOSPITAL_COMMUNITY)
Admission: RE | Admit: 2021-10-09 | Discharge: 2021-10-09 | Disposition: A | Discharge status: Home / Self Care | Payer: Medicare PPO | Source: Skilled Nursing Facility | Attending: Adult Health | Admitting: Adult Health

## 2021-10-09 DIAGNOSIS — E1122 Type 2 diabetes mellitus with diabetic chronic kidney disease: Secondary | ICD-10-CM | POA: Insufficient documentation

## 2021-10-10 ENCOUNTER — Encounter (HOSPITAL_COMMUNITY)
Admission: RE | Admit: 2021-10-10 | Discharge: 2021-10-10 | Disposition: A | Discharge status: Home / Self Care | Payer: Medicare PPO | Source: Skilled Nursing Facility | Attending: Adult Health | Admitting: Adult Health

## 2021-10-10 DIAGNOSIS — N186 End stage renal disease: Secondary | ICD-10-CM | POA: Diagnosis not present

## 2021-10-10 DIAGNOSIS — Z992 Dependence on renal dialysis: Secondary | ICD-10-CM | POA: Diagnosis not present

## 2021-10-10 DIAGNOSIS — E1122 Type 2 diabetes mellitus with diabetic chronic kidney disease: Secondary | ICD-10-CM | POA: Diagnosis not present

## 2021-10-10 LAB — COMPREHENSIVE METABOLIC PANEL
ALT: 17 U/L (ref 0–44)
AST: 15 U/L (ref 15–41)
Albumin: 3.5 g/dL (ref 3.5–5.0)
Alkaline Phosphatase: 75 U/L (ref 38–126)
Anion gap: 12 (ref 5–15)
BUN: 50 mg/dL — ABNORMAL HIGH (ref 8–23)
CO2: 25 mmol/L (ref 22–32)
Calcium: 9.5 mg/dL (ref 8.9–10.3)
Chloride: 100 mmol/L (ref 98–111)
Creatinine, Ser: 9.21 mg/dL — ABNORMAL HIGH (ref 0.44–1.00)
GFR, Estimated: 4 mL/min — ABNORMAL LOW (ref >60)
Glucose, Bld: 88 mg/dL (ref 70–99)
Potassium: 4.2 mmol/L (ref 3.5–5.1)
Sodium: 137 mmol/L (ref 135–145)
Total Bilirubin: 0.3 mg/dL (ref 0.3–1.2)
Total Protein: 6.8 g/dL (ref 6.5–8.1)

## 2021-10-10 LAB — HEMOGLOBIN A1C
Hgb A1c MFr Bld: 5.3 % (ref 4.8–5.6)
Mean Plasma Glucose: 105.41 mg/dL

## 2021-10-13 ENCOUNTER — Other Ambulatory Visit: Payer: Self-pay

## 2021-10-13 DIAGNOSIS — N186 End stage renal disease: Secondary | ICD-10-CM | POA: Diagnosis not present

## 2021-10-13 DIAGNOSIS — C9000 Multiple myeloma not having achieved remission: Secondary | ICD-10-CM

## 2021-10-13 DIAGNOSIS — Z992 Dependence on renal dialysis: Secondary | ICD-10-CM | POA: Diagnosis not present

## 2021-10-14 ENCOUNTER — Inpatient Hospital Stay (HOSPITAL_COMMUNITY): Payer: Medicare PPO

## 2021-10-14 ENCOUNTER — Inpatient Hospital Stay: Payer: Medicare PPO

## 2021-10-14 ENCOUNTER — Inpatient Hospital Stay (HOSPITAL_COMMUNITY): Payer: Medicare PPO | Admitting: Hematology

## 2021-10-14 ENCOUNTER — Inpatient Hospital Stay: Payer: Medicare PPO | Attending: Hematology

## 2021-10-14 VITALS — BP 132/66 | HR 112 | Temp 98.4°F | Resp 18 | Wt 138.3 lb

## 2021-10-14 DIAGNOSIS — N186 End stage renal disease: Secondary | ICD-10-CM | POA: Insufficient documentation

## 2021-10-14 DIAGNOSIS — Z9011 Acquired absence of right breast and nipple: Secondary | ICD-10-CM | POA: Diagnosis not present

## 2021-10-14 DIAGNOSIS — M8580 Other specified disorders of bone density and structure, unspecified site: Secondary | ICD-10-CM | POA: Insufficient documentation

## 2021-10-14 DIAGNOSIS — C9000 Multiple myeloma not having achieved remission: Secondary | ICD-10-CM | POA: Diagnosis not present

## 2021-10-14 DIAGNOSIS — Z992 Dependence on renal dialysis: Secondary | ICD-10-CM | POA: Diagnosis not present

## 2021-10-14 DIAGNOSIS — Z79811 Long term (current) use of aromatase inhibitors: Secondary | ICD-10-CM | POA: Insufficient documentation

## 2021-10-14 DIAGNOSIS — Z5111 Encounter for antineoplastic chemotherapy: Secondary | ICD-10-CM | POA: Insufficient documentation

## 2021-10-14 DIAGNOSIS — C50911 Malignant neoplasm of unspecified site of right female breast: Secondary | ICD-10-CM | POA: Insufficient documentation

## 2021-10-14 LAB — CBC WITH DIFFERENTIAL/PLATELET
Abs Immature Granulocytes: 0.03 10*3/uL (ref 0.00–0.07)
Basophils Absolute: 0 10*3/uL (ref 0.0–0.1)
Basophils Relative: 1 %
Eosinophils Absolute: 0.2 10*3/uL (ref 0.0–0.5)
Eosinophils Relative: 3 %
HCT: 31.2 % — ABNORMAL LOW (ref 36.0–46.0)
Hemoglobin: 9.7 g/dL — ABNORMAL LOW (ref 12.0–15.0)
Immature Granulocytes: 0 %
Lymphocytes Relative: 15 %
Lymphs Abs: 1.1 10*3/uL (ref 0.7–4.0)
MCH: 31.3 pg (ref 26.0–34.0)
MCHC: 31.1 g/dL (ref 30.0–36.0)
MCV: 100.6 fL — ABNORMAL HIGH (ref 80.0–100.0)
Monocytes Absolute: 0.6 10*3/uL (ref 0.1–1.0)
Monocytes Relative: 8 %
Neutro Abs: 5 10*3/uL (ref 1.7–7.7)
Neutrophils Relative %: 73 %
Platelets: 290 10*3/uL (ref 150–400)
RBC: 3.1 MIL/uL — ABNORMAL LOW (ref 3.87–5.11)
RDW: 17.2 % — ABNORMAL HIGH (ref 11.5–15.5)
WBC: 6.9 10*3/uL (ref 4.0–10.5)
nRBC: 0 % (ref 0.0–0.2)

## 2021-10-14 LAB — COMPREHENSIVE METABOLIC PANEL
ALT: 14 U/L (ref 0–44)
AST: 16 U/L (ref 15–41)
Albumin: 3.6 g/dL (ref 3.5–5.0)
Alkaline Phosphatase: 75 U/L (ref 38–126)
Anion gap: 12 (ref 5–15)
BUN: 40 mg/dL — ABNORMAL HIGH (ref 8–23)
CO2: 25 mmol/L (ref 22–32)
Calcium: 9.6 mg/dL (ref 8.9–10.3)
Chloride: 101 mmol/L (ref 98–111)
Creatinine, Ser: 7.61 mg/dL — ABNORMAL HIGH (ref 0.44–1.00)
GFR, Estimated: 5 mL/min — ABNORMAL LOW (ref >60)
Glucose, Bld: 175 mg/dL — ABNORMAL HIGH (ref 70–99)
Potassium: 4.1 mmol/L (ref 3.5–5.1)
Sodium: 138 mmol/L (ref 135–145)
Total Bilirubin: 0.2 mg/dL — ABNORMAL LOW (ref 0.3–1.2)
Total Protein: 7.1 g/dL (ref 6.5–8.1)

## 2021-10-14 LAB — MAGNESIUM: Magnesium: 2 mg/dL (ref 1.7–2.4)

## 2021-10-14 LAB — LACTATE DEHYDROGENASE: LDH: 129 U/L (ref 98–192)

## 2021-10-14 MED ORDER — BORTEZOMIB CHEMO SQ INJECTION 3.5 MG (2.5MG/ML)
1.3000 mg/m2 | Freq: Once | INTRAMUSCULAR | Status: AC
  Administered 2021-10-14: 2 mg via SUBCUTANEOUS
  Filled 2021-10-14: qty 0.8

## 2021-10-14 MED ORDER — DEXAMETHASONE 4 MG PO TABS
10.0000 mg | ORAL_TABLET | Freq: Once | ORAL | Status: AC
  Administered 2021-10-14: 10 mg via ORAL
  Filled 2021-10-14: qty 3

## 2021-10-14 MED ORDER — PROCHLORPERAZINE MALEATE 10 MG PO TABS
10.0000 mg | ORAL_TABLET | Freq: Once | ORAL | Status: AC
  Administered 2021-10-14: 10 mg via ORAL
  Filled 2021-10-14: qty 1

## 2021-10-14 NOTE — Progress Notes (Signed)
Patient presents today for Velcade, patient's HR 112, patient okay to continue with Velcade today per Dr. Delton Coombes, pharmacy aware. Patient tolerated Velcade injection with no complaints voiced. Lab work reviewed. See MAR for details. Injection site clean and dry with no bruising or swelling noted. Patient stable during and after injection. Band aid applied. VSS. Patient left in satisfactory condition with no s/s of distress noted.

## 2021-10-14 NOTE — Patient Instructions (Signed)
[facility]  Discharge Instructions: Thank you for choosing [facility] to provide your oncology and hematology care.  If you have a lab appointment with the [facility], please come in thru the Main Entrance and check in at the main information desk.  Wear comfortable clothing and clothing appropriate for easy access to any Portacath or PICC line.   We strive to give you quality time with your provider. You may need to reschedule your appointment if you arrive late (15 or more minutes).  Arriving late affects you and other patients whose appointments are after yours.  Also, if you miss three or more appointments without notifying the office, you may be dismissed from the clinic at the provider's discretion.      For prescription refill requests, have your pharmacy contact our office and allow 72 hours for refills to be completed.    Today you received the following chemotherapy and/or immunotherapy agents Velcade, return as scheduled.   To help prevent nausea and vomiting after your treatment, we encourage you to take your nausea medication as directed.  BELOW ARE SYMPTOMS THAT SHOULD BE REPORTED IMMEDIATELY: *FEVER GREATER THAN 100.4 F (38 C) OR HIGHER *CHILLS OR SWEATING *NAUSEA AND VOMITING THAT IS NOT CONTROLLED WITH YOUR NAUSEA MEDICATION *UNUSUAL SHORTNESS OF BREATH *UNUSUAL BRUISING OR BLEEDING *URINARY PROBLEMS (pain or burning when urinating, or frequent urination) *BOWEL PROBLEMS (unusual diarrhea, constipation, pain near the anus) TENDERNESS IN MOUTH AND THROAT WITH OR WITHOUT PRESENCE OF ULCERS (sore throat, sores in mouth, or a toothache) UNUSUAL RASH, SWELLING OR PAIN  UNUSUAL VAGINAL DISCHARGE OR ITCHING   Items with * indicate a potential emergency and should be followed up as soon as possible or go to the Emergency Department if any problems should occur.  Please show the CHEMOTHERAPY ALERT CARD or IMMUNOTHERAPY ALERT CARD at  check-in to the Emergency Department and triage nurse.  Should you have questions after your visit or need to cancel or reschedule your appointment, please contact [facility] [phone]  and follow the prompts.  Office hours are 8:00 a.m. to 4:30 p.m. Monday - Friday. Please note that voicemails left after 4:00 p.m. may not be returned until the following business day.  We are closed weekends and major holidays. You have access to a nurse at all times for urgent questions. Please call the main number to the clinic [phone] and follow the prompts.  For any non-urgent questions, you may also contact your provider using MyChart. We now offer e-Visits for anyone 33 and older to request care online for non-urgent symptoms. For details visit [email or web address].   Also download the MyChart app! Go to the app store, search "MyChart", open the app, select [facility], and log in with your MyChart username and password.  Masks are optional in the cancer centers. If you would like for your care team to wear a mask while they are taking care of you, please let them know. For doctor visits, patients may have with them one support person who is at least 84 years old. At this time, visitors are not allowed in the infusion area.

## 2021-10-15 DIAGNOSIS — Z992 Dependence on renal dialysis: Secondary | ICD-10-CM | POA: Diagnosis not present

## 2021-10-15 DIAGNOSIS — N186 End stage renal disease: Secondary | ICD-10-CM | POA: Diagnosis not present

## 2021-10-15 LAB — KAPPA/LAMBDA LIGHT CHAINS
Kappa free light chain: 121.9 mg/L — ABNORMAL HIGH (ref 3.3–19.4)
Kappa, lambda light chain ratio: 0.34 (ref 0.26–1.65)
Lambda free light chains: 356.7 mg/L — ABNORMAL HIGH (ref 5.7–26.3)

## 2021-10-16 LAB — PROTEIN ELECTROPHORESIS, SERUM
A/G Ratio: 1.4 (ref 0.7–1.7)
Albumin ELP: 3.7 g/dL (ref 2.9–4.4)
Alpha-1-Globulin: 0.2 g/dL (ref 0.0–0.4)
Alpha-2-Globulin: 0.8 g/dL (ref 0.4–1.0)
Beta Globulin: 1.1 g/dL (ref 0.7–1.3)
Gamma Globulin: 0.6 g/dL (ref 0.4–1.8)
Globulin, Total: 2.7 g/dL (ref 2.2–3.9)
Total Protein ELP: 6.4 g/dL (ref 6.0–8.5)

## 2021-10-16 LAB — IMMUNOFIXATION ELECTROPHORESIS
IgA: 264 mg/dL (ref 64–422)
IgG (Immunoglobin G), Serum: 718 mg/dL (ref 586–1602)
IgM (Immunoglobulin M), Srm: 6 mg/dL — ABNORMAL LOW (ref 26–217)
Total Protein ELP: 6.4 g/dL (ref 6.0–8.5)

## 2021-10-17 DIAGNOSIS — N186 End stage renal disease: Secondary | ICD-10-CM | POA: Diagnosis not present

## 2021-10-17 DIAGNOSIS — Z992 Dependence on renal dialysis: Secondary | ICD-10-CM | POA: Diagnosis not present

## 2021-10-19 DIAGNOSIS — R1312 Dysphagia, oropharyngeal phase: Secondary | ICD-10-CM | POA: Diagnosis not present

## 2021-10-19 DIAGNOSIS — I120 Hypertensive chronic kidney disease with stage 5 chronic kidney disease or end stage renal disease: Secondary | ICD-10-CM | POA: Diagnosis not present

## 2021-10-20 DIAGNOSIS — N186 End stage renal disease: Secondary | ICD-10-CM | POA: Diagnosis not present

## 2021-10-20 DIAGNOSIS — Z992 Dependence on renal dialysis: Secondary | ICD-10-CM | POA: Diagnosis not present

## 2021-10-21 ENCOUNTER — Other Ambulatory Visit: Payer: Self-pay

## 2021-10-21 ENCOUNTER — Inpatient Hospital Stay: Payer: Medicare PPO

## 2021-10-21 DIAGNOSIS — Z992 Dependence on renal dialysis: Secondary | ICD-10-CM | POA: Diagnosis not present

## 2021-10-21 DIAGNOSIS — C50911 Malignant neoplasm of unspecified site of right female breast: Secondary | ICD-10-CM | POA: Diagnosis not present

## 2021-10-21 DIAGNOSIS — N186 End stage renal disease: Secondary | ICD-10-CM | POA: Diagnosis not present

## 2021-10-21 DIAGNOSIS — C9000 Multiple myeloma not having achieved remission: Secondary | ICD-10-CM

## 2021-10-21 DIAGNOSIS — Z9011 Acquired absence of right breast and nipple: Secondary | ICD-10-CM | POA: Diagnosis not present

## 2021-10-21 DIAGNOSIS — M8580 Other specified disorders of bone density and structure, unspecified site: Secondary | ICD-10-CM | POA: Diagnosis not present

## 2021-10-21 DIAGNOSIS — Z79811 Long term (current) use of aromatase inhibitors: Secondary | ICD-10-CM | POA: Diagnosis not present

## 2021-10-21 DIAGNOSIS — Z5111 Encounter for antineoplastic chemotherapy: Secondary | ICD-10-CM | POA: Diagnosis not present

## 2021-10-21 LAB — COMPREHENSIVE METABOLIC PANEL
ALT: 13 U/L (ref 0–44)
AST: 15 U/L (ref 15–41)
Albumin: 3.7 g/dL (ref 3.5–5.0)
Alkaline Phosphatase: 76 U/L (ref 38–126)
Anion gap: 12 (ref 5–15)
BUN: 37 mg/dL — ABNORMAL HIGH (ref 8–23)
CO2: 28 mmol/L (ref 22–32)
Calcium: 9.2 mg/dL (ref 8.9–10.3)
Chloride: 98 mmol/L (ref 98–111)
Creatinine, Ser: 6.5 mg/dL — ABNORMAL HIGH (ref 0.44–1.00)
GFR, Estimated: 6 mL/min — ABNORMAL LOW (ref >60)
Glucose, Bld: 213 mg/dL — ABNORMAL HIGH (ref 70–99)
Potassium: 4 mmol/L (ref 3.5–5.1)
Sodium: 138 mmol/L (ref 135–145)
Total Bilirubin: 0.3 mg/dL (ref 0.3–1.2)
Total Protein: 6.9 g/dL (ref 6.5–8.1)

## 2021-10-21 LAB — CBC WITH DIFFERENTIAL/PLATELET
Abs Immature Granulocytes: 0.02 10*3/uL (ref 0.00–0.07)
Basophils Absolute: 0 10*3/uL (ref 0.0–0.1)
Basophils Relative: 1 %
Eosinophils Absolute: 0.1 10*3/uL (ref 0.0–0.5)
Eosinophils Relative: 3 %
HCT: 32 % — ABNORMAL LOW (ref 36.0–46.0)
Hemoglobin: 10 g/dL — ABNORMAL LOW (ref 12.0–15.0)
Immature Granulocytes: 0 %
Lymphocytes Relative: 18 %
Lymphs Abs: 1 10*3/uL (ref 0.7–4.0)
MCH: 31.4 pg (ref 26.0–34.0)
MCHC: 31.3 g/dL (ref 30.0–36.0)
MCV: 100.6 fL — ABNORMAL HIGH (ref 80.0–100.0)
Monocytes Absolute: 0.5 10*3/uL (ref 0.1–1.0)
Monocytes Relative: 9 %
Neutro Abs: 3.9 10*3/uL (ref 1.7–7.7)
Neutrophils Relative %: 69 %
Platelets: 265 10*3/uL (ref 150–400)
RBC: 3.18 MIL/uL — ABNORMAL LOW (ref 3.87–5.11)
RDW: 16.9 % — ABNORMAL HIGH (ref 11.5–15.5)
WBC: 5.6 10*3/uL (ref 4.0–10.5)
nRBC: 0 % (ref 0.0–0.2)

## 2021-10-21 LAB — LACTATE DEHYDROGENASE: LDH: 120 U/L (ref 98–192)

## 2021-10-22 DIAGNOSIS — N186 End stage renal disease: Secondary | ICD-10-CM | POA: Diagnosis not present

## 2021-10-22 DIAGNOSIS — Z992 Dependence on renal dialysis: Secondary | ICD-10-CM | POA: Diagnosis not present

## 2021-10-22 LAB — KAPPA/LAMBDA LIGHT CHAINS
Kappa free light chain: 140.7 mg/L — ABNORMAL HIGH (ref 3.3–19.4)
Kappa, lambda light chain ratio: 0.4 (ref 0.26–1.65)
Lambda free light chains: 348.5 mg/L — ABNORMAL HIGH (ref 5.7–26.3)

## 2021-10-24 DIAGNOSIS — N186 End stage renal disease: Secondary | ICD-10-CM | POA: Diagnosis not present

## 2021-10-24 DIAGNOSIS — Z992 Dependence on renal dialysis: Secondary | ICD-10-CM | POA: Diagnosis not present

## 2021-10-24 LAB — PROTEIN ELECTROPHORESIS, SERUM
A/G Ratio: 1.4 (ref 0.7–1.7)
Albumin ELP: 3.7 g/dL (ref 2.9–4.4)
Alpha-1-Globulin: 0.2 g/dL (ref 0.0–0.4)
Alpha-2-Globulin: 0.7 g/dL (ref 0.4–1.0)
Beta Globulin: 1 g/dL (ref 0.7–1.3)
Gamma Globulin: 0.5 g/dL (ref 0.4–1.8)
Globulin, Total: 2.6 g/dL (ref 2.2–3.9)
Total Protein ELP: 6.3 g/dL (ref 6.0–8.5)

## 2021-10-24 LAB — IMMUNOFIXATION ELECTROPHORESIS
IgA: 266 mg/dL (ref 64–422)
IgG (Immunoglobin G), Serum: 709 mg/dL (ref 586–1602)
IgM (Immunoglobulin M), Srm: 8 mg/dL — ABNORMAL LOW (ref 26–217)
Total Protein ELP: 6.4 g/dL (ref 6.0–8.5)

## 2021-10-25 DIAGNOSIS — R1312 Dysphagia, oropharyngeal phase: Secondary | ICD-10-CM | POA: Diagnosis not present

## 2021-10-25 DIAGNOSIS — I120 Hypertensive chronic kidney disease with stage 5 chronic kidney disease or end stage renal disease: Secondary | ICD-10-CM | POA: Diagnosis not present

## 2021-10-27 DIAGNOSIS — R1312 Dysphagia, oropharyngeal phase: Secondary | ICD-10-CM | POA: Diagnosis not present

## 2021-10-27 DIAGNOSIS — I120 Hypertensive chronic kidney disease with stage 5 chronic kidney disease or end stage renal disease: Secondary | ICD-10-CM | POA: Diagnosis not present

## 2021-10-27 DIAGNOSIS — Z992 Dependence on renal dialysis: Secondary | ICD-10-CM | POA: Diagnosis not present

## 2021-10-27 DIAGNOSIS — N186 End stage renal disease: Secondary | ICD-10-CM | POA: Diagnosis not present

## 2021-10-28 ENCOUNTER — Inpatient Hospital Stay: Payer: Medicare PPO

## 2021-10-28 ENCOUNTER — Inpatient Hospital Stay (HOSPITAL_BASED_OUTPATIENT_CLINIC_OR_DEPARTMENT_OTHER): Payer: Medicare PPO | Admitting: Hematology

## 2021-10-28 VITALS — BP 112/66 | HR 99 | Temp 99.8°F | Resp 18 | Wt 134.0 lb

## 2021-10-28 DIAGNOSIS — C9000 Multiple myeloma not having achieved remission: Secondary | ICD-10-CM

## 2021-10-28 DIAGNOSIS — M8580 Other specified disorders of bone density and structure, unspecified site: Secondary | ICD-10-CM | POA: Diagnosis not present

## 2021-10-28 DIAGNOSIS — Z79811 Long term (current) use of aromatase inhibitors: Secondary | ICD-10-CM | POA: Diagnosis not present

## 2021-10-28 DIAGNOSIS — Z5111 Encounter for antineoplastic chemotherapy: Secondary | ICD-10-CM | POA: Diagnosis not present

## 2021-10-28 DIAGNOSIS — D509 Iron deficiency anemia, unspecified: Secondary | ICD-10-CM | POA: Diagnosis not present

## 2021-10-28 DIAGNOSIS — N184 Chronic kidney disease, stage 4 (severe): Secondary | ICD-10-CM

## 2021-10-28 DIAGNOSIS — Z992 Dependence on renal dialysis: Secondary | ICD-10-CM | POA: Diagnosis not present

## 2021-10-28 DIAGNOSIS — N186 End stage renal disease: Secondary | ICD-10-CM | POA: Diagnosis not present

## 2021-10-28 DIAGNOSIS — C50911 Malignant neoplasm of unspecified site of right female breast: Secondary | ICD-10-CM | POA: Diagnosis not present

## 2021-10-28 DIAGNOSIS — Z9011 Acquired absence of right breast and nipple: Secondary | ICD-10-CM | POA: Diagnosis not present

## 2021-10-28 MED ORDER — PROCHLORPERAZINE MALEATE 10 MG PO TABS
10.0000 mg | ORAL_TABLET | Freq: Once | ORAL | Status: AC
  Administered 2021-10-28: 10 mg via ORAL
  Filled 2021-10-28: qty 1

## 2021-10-28 MED ORDER — DEXAMETHASONE 4 MG PO TABS
10.0000 mg | ORAL_TABLET | Freq: Once | ORAL | Status: AC
  Administered 2021-10-28: 10 mg via ORAL
  Filled 2021-10-28: qty 3

## 2021-10-28 MED ORDER — BORTEZOMIB CHEMO SQ INJECTION 3.5 MG (2.5MG/ML)
1.3000 mg/m2 | Freq: Once | INTRAMUSCULAR | Status: AC
  Administered 2021-10-28: 2 mg via SUBCUTANEOUS
  Filled 2021-10-28: qty 0.8

## 2021-10-28 NOTE — Progress Notes (Signed)
Patient presents today for Velcade. Patient and labs (8/15) assessed by Dr. Delton Coombes, patient okay for treatment, with keeping the dose the same.Patient tolerated Velcade injection with no complaints voiced. Lab work reviewed. See MAR for details. Injection site clean and dry with no bruising or swelling noted. Patient stable during and after injection. Band aid applied. VSS. Patient left in satisfactory condition with no s/s of distress noted.

## 2021-10-28 NOTE — Patient Instructions (Signed)
[facility]  Discharge Instructions: Thank you for choosing [facility] to provide your oncology and hematology care.  If you have a lab appointment with the Cancer Center, please come in thru the Main Entrance and check in at the main information desk.  Wear comfortable clothing and clothing appropriate for easy access to any Portacath or PICC line.   We strive to give you quality time with your provider. You may need to reschedule your appointment if you arrive late (15 or more minutes).  Arriving late affects you and other patients whose appointments are after yours.  Also, if you miss three or more appointments without notifying the office, you may be dismissed from the clinic at the provider's discretion.      For prescription refill requests, have your pharmacy contact our office and allow 72 hours for refills to be completed.    Today you received the following chemotherapy and/or immunotherapy agents Velcade, return as scheduled.   To help prevent nausea and vomiting after your treatment, we encourage you to take your nausea medication as directed.  BELOW ARE SYMPTOMS THAT SHOULD BE REPORTED IMMEDIATELY: *FEVER GREATER THAN 100.4 F (38 C) OR HIGHER *CHILLS OR SWEATING *NAUSEA AND VOMITING THAT IS NOT CONTROLLED WITH YOUR NAUSEA MEDICATION *UNUSUAL SHORTNESS OF BREATH *UNUSUAL BRUISING OR BLEEDING *URINARY PROBLEMS (pain or burning when urinating, or frequent urination) *BOWEL PROBLEMS (unusual diarrhea, constipation, pain near the anus) TENDERNESS IN MOUTH AND THROAT WITH OR WITHOUT PRESENCE OF ULCERS (sore throat, sores in mouth, or a toothache) UNUSUAL RASH, SWELLING OR PAIN  UNUSUAL VAGINAL DISCHARGE OR ITCHING   Items with * indicate a potential emergency and should be followed up as soon as possible or go to the Emergency Department if any problems should occur.  Please show the CHEMOTHERAPY ALERT CARD or IMMUNOTHERAPY ALERT CARD at  check-in to the Emergency Department and triage nurse.  Should you have questions after your visit or need to cancel or reschedule your appointment, please contact [facility] [phone]  and follow the prompts.  Office hours are 8:00 a.m. to 4:30 p.m. Monday - Friday. Please note that voicemails left after 4:00 p.m. may not be returned until the following business day.  We are closed weekends and major holidays. You have access to a nurse at all times for urgent questions. Please call the main number to the clinic [phone] and follow the prompts.  For any non-urgent questions, you may also contact your provider using MyChart. We now offer e-Visits for anyone 18 and older to request care online for non-urgent symptoms. For details visit [email or web address].   Also download the MyChart app! Go to the app store, search "MyChart", open the app, select [facility], and log in with your MyChart username and password.  Masks are optional in the cancer centers. If you would like for your care team to wear a mask while they are taking care of you, please let them know. You may have one support person who is at least 84 years old accompany you for your appointments.  

## 2021-10-28 NOTE — Patient Instructions (Signed)
[facility] Discharge Instructions  You were seen and examined today by Dr. Delton Coombes.  Dr. Delton Coombes discussed your most recent lab work and everything looks good and stable.   Follow-up as scheduled in 8 weeks with labs.    Thank you for choosing [facility] to provide your oncology and hematology care.  To afford each patient quality time with our provider, please arrive at least 15 minutes before your scheduled appointment time.   If you have a lab appointment with the [facility] please come in thru the Main Entrance and check in at the main information desk.  You need to re-schedule your appointment should you arrive 10 or more minutes late.  We strive to give you quality time with our providers, and arriving late affects you and other patients whose appointments are after yours.  Also, if you no show three or more times for appointments you may be dismissed from the clinic at the providers discretion.     Again, thank you for choosing [facility].  Our hope is that these requests will decrease the amount of time that you wait before being seen by our physicians.       _____________________________________________________________  Should you have questions after your visit to [facility], please contact our office at [phone] and follow the prompts.  Our office hours are 8:00 a.m. and 4:30 p.m. Monday - Friday.  Please note that voicemails left after 4:00 p.m. may not be returned until the following business day.  We are closed weekends and major holidays.  You do have access to a nurse 24-7, just call the main number to the clinic [phone] and do not press any options, hold on the line and a nurse will answer the phone.    For prescription refill requests, have your pharmacy contact our office and allow 72 hours.

## 2021-10-28 NOTE — Progress Notes (Signed)
[place], [place] [address]   CLINIC:  Medical Oncology/Hematology  PCP:  Gerlene Fee, NP [address] [place] [address] [phone]   REASON FOR VISIT:  Follow-up for plasma cell myeloma and anemia  PRIOR THERAPY: none  NGS Results: not done  CURRENT THERAPY: Velcade & Decadron 3/4 weeks  BRIEF ONCOLOGIC HISTORY:  Oncology History  Stage 1 infiltrating ductal carcinoma of right female breast ([place])  09/12/2014 Mammogram   Mass in upper outer R breast, middle third depth appears slightly larger than it was on prior exam with more irreg spiculated margins   10/02/2014 Pathology Results   biopsy with invasive ductal carcinoma high grade 1.1 cm, dcis solid type. additional R breast tissue, excision, invasive ductal high grade 0.9 cm    10/24/2014 Pathology Results   no residual invasive carcinoma, DCIS, focal, atypical ductal hyperplasia, 0/7 LN positive for metastatic carcinoma ER > 90%, PR 30%, HER 2 2+   10/24/2014 Cancer Staging   T1cN0M0   10/24/2014 Surgery   R mastectomy, T1c, N0   12/28/2014 - 11/22/2015 Chemotherapy   Taxol/herceptin weekly X 12, Herceptin every 21 days, last due in September 2017   03/11/2015 -  Anti-estrogen oral therapy   Arimidex 1 mg daily   09/12/2015 Imaging   MUGA- The left ventricular ejection fraction equals 68%.   06/08/2016 Treatment Plan Change   Started Nerlynx   Multiple myeloma ([place])  10/04/2018 Initial Diagnosis   Multiple myeloma ([place])   10/11/2018 -  Chemotherapy   Patient is on Treatment Plan : MYELOMA NON-TRANSPLANT CANDIDATES VRd weekly d21d        CANCER STAGING:  Cancer Staging  Stage 1 infiltrating ductal carcinoma of right female breast [facility]) Staging form: Breast, AJCC 7th Edition - Clinical stage from 08/30/2015: Stage IA (T1c, N0, M0) - Signed by Baird Cancer, PA-C on 08/30/2015   INTERVAL HISTORY:  Carly Wood, a 86 y.o. female, returns for follow-up  of multiple myeloma next cycle of Velcade.  She denies any worsening of neuropathy in the right hand.  No other new neuropathy.  Denies any diarrhea.  Denies any fevers or infections.  Energy levels are 60%.   REVIEW OF SYSTEMS:  Review of Systems  Constitutional:  Negative for appetite change and fatigue.  HENT:   Positive for trouble swallowing.   Respiratory:  Negative for cough.   Gastrointestinal:  Positive for vomiting.  Neurological:  Positive for numbness (Right hand fingers on and off.).  All other systems reviewed and are negative.   PAST MEDICAL/SURGICAL HISTORY:  Past Medical History:  Diagnosis Date   Anemia     chronic macrocytic anemia   Anxiety    Chronic kidney disease    Chronic renal disease, stage 4, severely decreased glomerular filtration rate (GFR) between 15-29 mL/min/1.73 square meter ([place]) 24/23/5361   Complication of anesthesia    delirious after Breast Surgery   Dementia ([place])    mild   Depression    Diabetes mellitus with ESRD (end-stage renal disease) ([place])    type II   Dysphagia    Dyspnea    with activity   GERD (gastroesophageal reflux disease)    Glaucoma    Hyperlipidemia    Hypertension    Pneumonia    Stage 1 infiltrating ductal carcinoma of right female breast ([place]) 08/21/2015   ER+ PR+ HER 2 neu + (3+) T1cN0    Past Surgical History:  Procedure Laterality Date   A/V FISTULAGRAM Right 07/05/2020   Procedure: A/V Fistulagram;  Surgeon: Elam Dutch, MD;  Location: [place] [facility] LAB;  Service: Cardiovascular;  Laterality: Right;   AV FISTULA PLACEMENT Left 11/22/2017   Procedure: ARTERIOVENOUS (AV) FISTULA CREATION LEFT ARM;  Surgeon: Elam Dutch, MD;  [facility];  Service: Vascular;  Laterality: Left;   AV FISTULA PLACEMENT Right 04/04/2020   Procedure: RIGHT ARM ARTERIOVENOUS FISTULA CREATION;  Surgeon: Rosetta Posner, MD;  [facility];  Service: Vascular;  Laterality: Right;   AV FISTULA PLACEMENT Right 08/20/2020    Procedure: ARTERIOVENOUS (AV) FISTULA LIGATION RIGHT ARM;  Surgeon: Elam Dutch, MD;  Location: [facility] OR;  Service: Vascular;  Laterality: Right;   BIOPSY  08/07/2016   Procedure: BIOPSY;  Surgeon: Daneil Dolin, MD;  [facility];  Service: Endoscopy;;  gastric ulcer biopsy   COLONOSCOPY     ESOPHAGOGASTRODUODENOSCOPY N/A 08/07/2016   LA Grade A esophagitis s/p dilation, small hiatal hernia, multiple gastric ulcers and erosions, duodenal erosions s/p biopsy. Negative H.pylori    ESOPHAGOGASTRODUODENOSCOPY N/A 11/27/2016   normal esophagus, previously noted gastric ulcers completely healed, normal duodenum.    ESOPHAGOGASTRODUODENOSCOPY (EGD) WITH PROPOFOL N/A 07/23/2020   Procedure: ESOPHAGOGASTRODUODENOSCOPY (EGD) WITH PROPOFOL;  Surgeon: Rogene Houston, MD;  [facility];  Service: Endoscopy;  Laterality: N/A;   FISTULA SUPERFICIALIZATION Left 02/14/2018   Procedure: FISTULA SUPERFICIALIZATION LEFT ARM;  Surgeon: Angelia Mould, MD;  Location: [facility] OR;  Service: Vascular;  Laterality: Left;   FRACTURE SURGERY Right    ankle   HOT HEMOSTASIS  07/23/2020   Procedure: HOT HEMOSTASIS (ARGON PLASMA COAGULATION/BICAP);  Surgeon: Rogene Houston, MD;  [facility];  Service: Endoscopy;;   INTRAMEDULLARY (IM) NAIL INTERTROCHANTERIC Right 07/12/2020   Procedure: INTRAMEDULLARY (IM) NAIL INTERTROCHANTRIC;  Surgeon: Mordecai Rasmussen, MD;  [facility];  Service: Orthopedics;  Laterality: Right;   MALONEY DILATION N/A 08/07/2016   Procedure: Venia Minks DILATION;  Surgeon: Daneil Dolin, MD;  [facility];  Service: Endoscopy;  Laterality: N/A;   MASTECTOMY, PARTIAL Right    multiple myeloma     PERIPHERAL VASCULAR BALLOON ANGIOPLASTY Left 07/13/2019   Procedure: PERIPHERAL VASCULAR BALLOON ANGIOPLASTY;  Surgeon: Marty Heck, MD;  Location: [place] CV LAB;  Service: Cardiovascular;  Laterality: Left;  arm fistulogram   PERIPHERAL VASCULAR  BALLOON ANGIOPLASTY Right 05/22/2020   Procedure: PERIPHERAL VASCULAR BALLOON ANGIOPLASTY;  Surgeon: Cherre Robins, MD;  Location: [place] [facility] LAB;  Service: Cardiovascular;  Laterality: Right;  arm fistula   PERIPHERAL VASCULAR BALLOON ANGIOPLASTY Right 07/05/2020   Procedure: PERIPHERAL VASCULAR BALLOON ANGIOPLASTY;  Surgeon: Elam Dutch, MD;  Location: [place] [facility] LAB;  Service: Cardiovascular;  Laterality: Right;  arm fistula   PORT-A-CATH REMOVAL Left 11/22/2017   Procedure: REMOVAL PORT-A-CATH LEFT CHEST;  Surgeon: Elam Dutch, MD;  Location: [place];  Service: Vascular;  Laterality: Left;   RETINAL DETACHMENT SURGERY Right    SCLEROTHERAPY  07/23/2020   Procedure: SCLEROTHERAPY;  Surgeon: Rogene Houston, MD;  [facility];  Service: Endoscopy;;   UPPER EXTREMITY VENOGRAPHY N/A 08/22/2020   Procedure: LEFT UPPER & CENTRAL VENOGRAPHY;  Surgeon: Marty Heck, MD;  Location: [place] [facility] LAB;  Service: Cardiovascular;  Laterality: N/A;    SOCIAL HISTORY:  Social History   Socioeconomic History   Marital status: Single    Spouse name: Not on  file   Number of children: Not on file   Years of education: Not on file   Highest education level: Not on file  Occupational History   Occupation: retired   Tobacco Use   Smoking status: Never   Smokeless tobacco: Never  Vaping Use   Vaping Use: Never used  Substance and Sexual Activity   Alcohol use: No    Alcohol/week: 0.0 standard drinks of alcohol   Drug use: No   Sexual activity: Never  Other Topics Concern   Not on file  Social History Narrative   Long term resident of SNF    Social Determinants of Health   Financial Resource Strain: [place]  (01/09/2020)   Overall Financial Resource Strain (CARDIA)    Difficulty of Paying Living Expenses: Not very hard  Food Insecurity: No Food Insecurity (01/09/2020)   Hunger Vital Sign    Worried About Running Out of Food in the Last Year: Never true     Ran Out of Food in the Last Year: Never true  Transportation Needs: No Transportation Needs (01/09/2020)   PRAPARE - Hydrologist (Medical): No    Lack of Transportation (Non-Medical): No  Physical Activity: Inactive (01/09/2020)   Exercise Vital Sign    Days of Exercise per Week: 0 days    Minutes of Exercise per Session: 0 min  Stress: No Stress Concern Present (01/09/2020)   [place]    Feeling of Stress : Not at all  Social Connections: Moderately Isolated (01/09/2020)   Social Connection and Isolation Panel [NHANES]    Frequency of Communication with Friends and Family: More than three times a week    Frequency of Social Gatherings with Friends and Family: Once a week    Attends Religious Services: More than 4 times per year    Active Member of [employer] or Organizations: No    Attends Archivist Meetings: Never    Marital Status: Never married  Intimate Partner Violence: Not At Risk (01/09/2020)   Humiliation, Afraid, Rape, and Kick questionnaire    Fear of Current or Ex-Partner: No    Emotionally Abused: No    Physically Abused: No    Sexually Abused: No    FAMILY HISTORY:  Family History  Problem Relation Age of Onset   Multiple myeloma Sister    Brain cancer Sister    Dementia Mother        died at 17   Stroke Mother    Heart failure Mother    Diabetes Mother    Heart disease Father    Prostate cancer Brother    Colon cancer Neg Hx     CURRENT MEDICATIONS:  Current Outpatient Medications  Medication Sig Dispense Refill   acetaminophen (TYLENOL) 325 MG tablet Take 650 mg by mouth every 8 (eight) hours.     acyclovir (ZOVIRAX) 200 MG capsule Take 200 mg by mouth in the morning. (0800)     Amino Acids-Protein Hydrolys (FEEDING SUPPLEMENT, PRO-STAT SUGAR FREE 64,) LIQD Take 30 mLs by mouth 3 (three) times daily with meals. (0800, 1200 & 1800)     anastrozole  (ARIMIDEX) 1 MG tablet TAKE 1 TABLET BY MOUTH DAILY 30 tablet 6   Calcium Acetate 667 MG TABS Take 2 tablets by mouth 3 (three) times daily with meals. 1 tablet daily w/ snack     calcium-vitamin D (OSCAL WITH D) 500-200 MG-UNIT tablet Take 1 tablet by  mouth in the morning. (0800)     docusate sodium (COLACE) 100 MG capsule Take 100 mg by mouth 2 (two) times daily.     Insulin Pen Needle (BD AUTOSHIELD DUO) 30G X 5 MM MISC by Does not apply route. 3/16"     melatonin 3 MG TABS tablet Take 6 mg by mouth at bedtime. (2000) For Sleep     multivitamin (RENA-VIT) TABS tablet Take 1 tablet by mouth at bedtime. (2100)     ondansetron (ZOFRAN) 4 MG tablet Take 1 tablet (4 mg total) by mouth every 6 (six) hours as needed for nausea. 20 tablet 0   pantoprazole (PROTONIX) 40 MG tablet Take 40 mg by mouth 2 (two) times daily.     sertraline (ZOLOFT) 25 MG tablet Take 25 mg by mouth daily. At 9 am along with 50 mg tablet for a total of 75 mg daily     sertraline (ZOLOFT) 50 MG tablet Take 50 mg by mouth daily. At 9 am along with 25 mg tablet for a total of 75 mg daily     sucralfate (CARAFATE) 1 GM/10ML suspension Take 1 g by mouth in the morning, at noon, in the evening, and at bedtime. (0800, 1200, 1700 & 2100)     No current facility-administered medications for this visit.   Facility-Administered Medications Ordered in Other Visits  Medication Dose Route Frequency Provider Last Rate Last Admin   lanreotide acetate (SOMATULINE DEPOT) 120 MG/0.5ML injection            lanreotide acetate (SOMATULINE DEPOT) 120 MG/0.5ML injection            octreotide (SANDOSTATIN LAR) 30 MG IM injection            octreotide (SANDOSTATIN LAR) 30 MG IM injection             ALLERGIES:  Allergies  Allergen Reactions   Ace Inhibitors Other (See Comments) and Cough    Tongue swell , ie angioedema   Angiotensin Receptor Blockers     Angioedema with ACE-I   Penicillins Other (See Comments)    Unsure of reaction Has  patient had a PCN reaction causing immediate rash, facial/tongue/throat swelling, SOB or lightheadedness with hypotension: Unknown Has patient had a PCN reaction causing severe rash involving mucus membranes or skin necrosis: Unknown Has patient had a PCN reaction that required hospitalization: No Has patient had a PCN reaction occurring within the last 10 years: Unknown If all of the above answers are "NO", then may proceed with Cephalosporin use.     Penicillin G     PHYSICAL EXAM:  Performance status (ECOG): 2 - Symptomatic, <50% confined to bed  Vitals:   10/28/21 1414  BP: 112/66  Pulse: 99  Resp: 18  Temp: 99.8 F (37.7 C)  SpO2: 100%   Wt Readings from Last 3 Encounters:  10/28/21 134 lb (60.8 kg)  10/14/21 138 lb 4.8 oz (62.7 kg)  10/06/21 142 lb 3.2 oz (64.5 kg)   Physical Exam Vitals reviewed.  Constitutional:      Appearance: Normal appearance.     Comments: In wheelchair  Cardiovascular:     Rate and Rhythm: Normal rate and regular rhythm.     Pulses: Normal pulses.     Heart sounds: Normal heart sounds.  Pulmonary:     Effort: Pulmonary effort is normal.     Breath sounds: Normal breath sounds.  Neurological:     General: No focal deficit present.  Mental Status: She is alert and oriented to person, place, and time.  Psychiatric:        Mood and Affect: Mood normal.        Behavior: Behavior normal.     LABORATORY DATA:  I have reviewed the labs as listed.     Latest Ref Rng & Units 10/21/2021   10:48 AM 10/14/2021    1:10 PM 10/03/2021    6:02 AM  CBC  WBC 4.0 - 10.5 K/uL 5.6  6.9  7.2   Hemoglobin 12.0 - 15.0 g/dL 10.0  9.7  8.6   Hematocrit 36.0 - 46.0 % 32.0  31.2  27.5   Platelets 150 - 400 K/uL 265  290  179       Latest Ref Rng & Units 10/21/2021   10:48 AM 10/14/2021    1:10 PM 10/10/2021    8:20 AM  CMP  Glucose 70 - 99 mg/dL 213  175  88   BUN 8 - 23 mg/dL 37  40  50   Creatinine 0.44 - 1.00 mg/dL 6.50  7.61  9.21   Sodium 135 -  145 mmol/L 138  138  137   Potassium 3.5 - 5.1 mmol/L 4.0  4.1  4.2   Chloride 98 - 111 mmol/L 98  101  100   CO2 22 - 32 mmol/L _0 Calcium 8.9 - 10.3 mg/dL 9.2  9.6  9.5   Total Protein 6.5 - 8.1 g/dL 6.9  7.1  6.8   Total Bilirubin 0.3 - 1.2 mg/dL 0.3  0.2  0.3   Alkaline Phos 38 - 126 U/L 76  75  75   AST 15 - 41 U/L _1 ALT 0 - 44 U/L _2 DIAGNOSTIC IMAGING:  I have independently reviewed the scans and discussed with the patient. ECHOCARDIOGRAM COMPLETE  Result Date: 10/02/2021    ECHOCARDIOGRAM REPORT   Patient Name:   SHTERNA LARAMEE Date of Exam: 10/02/2021 Medical Rec #:  [id number]      Height:       63.0 in Accession #:    [id number]     Weight:       146.4 lb Date of Birth:  1935-09-24     BSA:          1.693 m Patient Age:    7 years       BP:           129/59 mmHg Patient Gender: F              HR:           66 bpm. Exam Location:  [facility] Procedure: 2D Echo, Cardiac Doppler and Color Doppler Indications:    Syncope  History:        Patient has no prior history of Echocardiogram examinations.                 Signs/Symptoms:Syncope; Risk Factors:Diabetes, Hypertension and                 Dyslipidemia. Breast CA, Multiple myleoma, CKD on Dialysis.  Sonographer:    Wenda Low Referring Phys: [place]  1. Left ventricular ejection fraction, by estimation, is 70 to 75%. The left ventricle has hyperdynamic function. The left ventricle has no regional wall motion abnormalities. There is moderate asymmetric left ventricular hypertrophy of the basal-septal  segment. Left ventricular diastolic parameters are consistent with Grade I diastolic dysfunction (impaired relaxation).  2. Right ventricular systolic function is normal. The right ventricular size is normal. There is normal pulmonary artery systolic pressure. The estimated right ventricular systolic pressure is 36.6 mmHg.  3. Left atrial size was mildly dilated.  4. The mitral  valve is degenerative. Trivial mitral valve regurgitation. No evidence of mitral stenosis. Moderate mitral annular calcification.  5. The aortic valve was not well visualized. Aortic valve regurgitation is not visualized. Mild aortic valve stenosis.  6. The inferior vena cava is normal in size with greater than 50% respiratory variability, suggesting right atrial pressure of 3 mmHg. FINDINGS  Left Ventricle: Left ventricular ejection fraction, by estimation, is 70 to 75%. The left ventricle has hyperdynamic function. The left ventricle has no regional wall motion abnormalities. The left ventricular internal cavity size was small. There is moderate asymmetric left ventricular hypertrophy of the basal-septal segment. Left ventricular diastolic parameters are consistent with Grade I diastolic dysfunction (impaired relaxation). Right Ventricle: The right ventricular size is normal. No increase in right ventricular wall thickness. Right ventricular systolic function is normal. There is normal pulmonary artery systolic pressure. The tricuspid regurgitant velocity is 2.14 m/s, and  with an assumed right atrial pressure of 3 mmHg, the estimated right ventricular systolic pressure is 44.0 mmHg. Left Atrium: Left atrial size was mildly dilated. Right Atrium: Right atrial size was normal in size. Pericardium: There is no evidence of pericardial effusion. Mitral Valve: The mitral valve is degenerative in appearance. Moderate mitral annular calcification. Trivial mitral valve regurgitation. No evidence of mitral valve stenosis. MV peak gradient, 5.4 mmHg. The mean mitral valve gradient is 1.0 mmHg. Tricuspid Valve: The tricuspid valve is normal in structure. Tricuspid valve regurgitation is trivial. Aortic Valve: The aortic valve was not well visualized. Aortic valve regurgitation is not visualized. Mild aortic stenosis is present. Aortic valve mean gradient measures 7.5 mmHg. Aortic valve peak gradient measures 14.9 mmHg.  Aortic valve area, by VTI measures 1.91 cm. Pulmonic Valve: The pulmonic valve was not well visualized. Pulmonic valve regurgitation is not visualized. Aorta: The aortic root and ascending aorta are structurally normal, with no evidence of dilitation. Venous: The inferior vena cava is normal in size with greater than 50% respiratory variability, suggesting right atrial pressure of 3 mmHg. IAS/Shunts: The interatrial septum was not well visualized.  LEFT VENTRICLE PLAX 2D LVIDd:         3.60 cm   Diastology LVIDs:         2.60 cm   LV e' medial:    3.26 cm/s LV PW:         0.90 cm   LV E/e' medial:  16.2 LV IVS:        1.10 cm   LV e' lateral:   6.42 cm/s LVOT diam:     2.00 cm   LV E/e' lateral: 8.2 LV SV:         80 LV SV Index:   47 LVOT Area:     3.14 cm  RIGHT VENTRICLE RV Basal diam:  2.95 cm RV Mid diam:    2.40 cm RV S prime:     13.10 cm/s TAPSE (M-mode): 2.6 cm LEFT ATRIUM             Index        RIGHT ATRIUM           Index LA diam:  2.70 cm 1.59 cm/m   RA Area:     15.20 cm LA Vol (A2C):   66.1 ml 39.03 ml/m  RA Volume:   40.30 ml  23.80 ml/m LA Vol (A4C):   65.6 ml 38.74 ml/m LA Biplane Vol: 68.4 ml 40.39 ml/m  AORTIC VALVE                     PULMONIC VALVE AV Area (Vmax):    1.91 cm      PV Vmax:       0.74 m/s AV Area (Vmean):   1.85 cm      PV Peak grad:  2.2 mmHg AV Area (VTI):     1.91 cm AV Vmax:           193.00 cm/s AV Vmean:          122.000 cm/s AV VTI:            0.418 m AV Peak Grad:      14.9 mmHg AV Mean Grad:      7.5 mmHg LVOT Vmax:         117.50 cm/s LVOT Vmean:        71.900 cm/s LVOT VTI:          0.254 m LVOT/AV VTI ratio: 0.61  AORTA Ao Root diam: 3.50 cm Ao Asc diam:  3.00 cm MITRAL VALVE               TRICUSPID VALVE MV Area (PHT): 1.95 cm    TR Peak grad:   18.3 mmHg MV Area VTI:   2.81 cm    TR Vmax:        214.00 cm/s MV Peak grad:  5.4 mmHg MV Mean grad:  1.0 mmHg    SHUNTS MV Vmax:       1.16 m/s    Systemic VTI:  0.25 m MV Vmean:      54.7 cm/s    Systemic Diam: 2.00 cm MV Decel Time: 389 msec MV E velocity: 52.70 cm/s MV A velocity: 88.70 cm/s MV E/A ratio:  0.59 Oswaldo Milian MD Electronically signed by Oswaldo Milian MD Signature Date/Time: 10/02/2021/4:41:07 PM    Final    CT CHEST ABDOMEN PELVIS W CONTRAST  Result Date: 10/01/2021 CLINICAL DATA:  Concern for aspiration pneumonia. Abnormal liver labs. Confusion. EXAM: CT CHEST, ABDOMEN, AND PELVIS WITH CONTRAST TECHNIQUE: Multidetector CT imaging of the chest, abdomen and pelvis was performed following the standard protocol during bolus administration of intravenous contrast. RADIATION DOSE REDUCTION: This exam was performed according to the departmental dose-optimization program which includes automated exposure control, adjustment of the mA and/or kV according to patient size and/or use of iterative reconstruction technique. CONTRAST:  174m OMNIPAQUE IOHEXOL 300 MG/ML  SOLN COMPARISON:  Ultrasound 10/01/2021.  Radiography 10/01/2021. FINDINGS: CT CHEST FINDINGS Cardiovascular: Heart size is normal. Extensive coronary artery calcification. Aortic atherosclerotic calcification. Aortic valve region calcification. Mediastinum/Nodes: No mediastinal or hilar mass or lymphadenopathy. Small hiatal hernia. Lungs/Pleura: The lung parenchyma is clear. No infiltrate or collapse. Small area of scarring in the medial left lower lobe. Musculoskeletal: Curvature in chronic degenerative change of the spine. Bridging thoracic osteophytes. Acute appearing superior endplate fracture at TT70 L1 and L2. CT ABDOMEN PELVIS FINDINGS Hepatobiliary: Liver parenchyma is normal. Multiple stones within the gallbladder. No CT sign of cholecystitis or obstruction. Pancreas: 1 x 2 cm cystic abnormality in the ventral head of the pancreas. Spleen: Normal Adrenals/Urinary Tract: Adrenal glands are normal.  Renal cortical atrophy with multiple cysts. Extensive renovascular calcification including a 1 cm aneurysm in the  right renal hilum. Nonobstructing 5 mm stone in the lower pole of the left kidney. Exophytic abnormality at the inferior pole of the right kidney is indeterminate for cyst versus mass, measuring up to 2.7 cm in size. Bladder is normal. Stomach/Bowel: Small hiatal hernia. Stomach otherwise normal. Small bowel pattern is normal. No sign of appendicitis. Mild diverticulosis of the colon without visible diverticulitis. Vascular/Lymphatic: Aortic atherosclerosis. No aneurysm. IVC is normal. No adenopathy. Reproductive: Multiple calcified leiomyomas of the uterus. No other pelvic masses seen. Other: No free fluid or air. Musculoskeletal: As noted above, probably acute superior endplate fractures at X72, L1 and L2. Lower lumbar degenerative changes with anterolisthesis at L4-5 and probable stenosis at this level. Previous gamma nail fixation of a right femur fracture. Probable Paget's disease within the right hemipelvis. IMPRESSION: No aspiration pneumonia. Mild focal scarring at the medial left lung base. Coronary artery calcification. Aortic atherosclerotic calcification. Aortic Atherosclerosis (ICD10-I70.0). Probably acute superior endplate fractures at I20, L1 and L2. No retropulsed bone. Renal atrophy. Multiple renal cysts. Indeterminate 2.7 cm lesion at the lower pole the right kidney could be a complicated cyst or a mass. This could be evaluated with renal MRI. 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 Nonobstructing 5 mm stone in the lower pole of the left kidney. 1 x 2 cm cystic abnormality in the ventral head of the pancreas. Best practice recommendation is for reimaging of this every 6 months for 2 years and then every 1 year for 2 years and then every 2 years for 6 years. Follow-up can be discontinued if stable for 10 years. This recommendation could be tailored to this patient's clinical status and life expectancy. Paget's disease of the right hemipelvis. Diverticulosis without evidence of diverticulitis. Small  hiatal hernia. Multiple calcified leiomyomas the uterus. Electronically Signed   By: Nelson Chimes M.D.   On: 10/01/2021 14:44   US Abdomen Limited RUQ (LIVER/GB)  Result Date: 10/01/2021 CLINICAL DATA:  Vomiting, found unresponsive EXAM: ULTRASOUND ABDOMEN LIMITED RIGHT UPPER QUADRANT COMPARISON:  None Available. FINDINGS: Gallbladder: No wall thickening. Gallstones are present, largest measures 2.4 cm. No sonographic Murphy sign noted by sonographer. Common bile duct: Diameter: 2.4 mm Liver: No focal lesion identified. Increased parenchymal echogenicity. Portal vein is patent on color Doppler imaging with normal direction of blood flow towards the liver. Other: Evaluation somewhat limited due to patient positioning. IMPRESSION: 1. Cholelithiasis with no evidence of acute cholecystitis. 2. Increased echogenicity of the liver parenchyma, findings suggestive of hepatic steatosis. Electronically Signed   By: Yetta Glassman M.D.   On: 10/01/2021 12:08   DG Chest Portable 1 View  Result Date: 10/01/2021 CLINICAL DATA:  Possible aspiration. EXAM: PORTABLE CHEST 1 VIEW COMPARISON:  Radiographs 07/23/2020 and 07/14/2020. FINDINGS: 0957 hours. Left IJ hemodialysis catheter projects to the level of the upper right atrium. The heart size and mediastinal contours are stable with aortic atherosclerosis. There is mild atelectasis or scarring at both lung bases, but no confluent airspace opacity, edema, pneumothorax or significant pleural effusion. Small surgical clips are present in the right axilla. No acute osseous findings are evident. Telemetry leads overlie the chest. IMPRESSION: No evidence of active cardiopulmonary process. Electronically Signed   By: Richardean Sale M.D.   On: 10/01/2021 10:10     ASSESSMENT:  1.  IgA lambda plasma cell myeloma, stage II, standard risk: -BMBX on 09/20/2018 shows plasma cell myeloma,  30% plasma cells.  FISH panel was normal.  Chromosome analysis normal. -Labs at diagnosis on  08/29/2018 with M spike 0.9 g.  Kappa light chains 148, lambda light chains 1132, ratio 0.13.  LDH normal.  Beta-2 microglobulin 13.5.  She had transfusion dependent anemia. -Velcade and dexamethasone started on 10/11/2018. -Myeloma panel on 07/11/2019 shows SPEP is negative.  Free light chain ratio improved to 1.09.  Lambda light chains are 128, improved from 141.  Kappa light chains are 140. -She has been resident at [facility] since 10/12/2019.   2.  Stage I IDC of the right breast, ER/PR positive, HER-2 negative: -She is on anastrozole.   PLAN:  1.  IgA lambda plasma cell myeloma: - She is tolerating Velcade every other week very well. - Reviewed myeloma panel from 10/21/2021.  Lambda light chains are 348 and stable.  Free light chain ratio 0.4.  Immunofixation was positive for IgG lambda.  M spike is negative.  Hemoglobin is 10 and stable. - Continue Velcade every other week along with dexamethasone 10 mg. - RTC 8 weeks for follow-up with repeat myeloma panel.   2.  ESRD on HD: -New HD on Monday, Wednesday and Friday.   3.  Bone strengthening: -Bisphosphonates were not started due to patient being on dialysis.   4.  Osteopenia: -Last bone density was in 2017.  We will plan to repeat it this year.   5.  Right breast cancer: -Continue anastrozole.  She is tolerating it well.   Orders placed this encounter:  No orders of the defined types were placed in this encounter.    Derek Jack, MD [place] [phone]

## 2021-10-29 ENCOUNTER — Other Ambulatory Visit: Payer: Self-pay

## 2021-10-29 DIAGNOSIS — N186 End stage renal disease: Secondary | ICD-10-CM | POA: Diagnosis not present

## 2021-10-29 DIAGNOSIS — Z992 Dependence on renal dialysis: Secondary | ICD-10-CM | POA: Diagnosis not present

## 2021-10-30 ENCOUNTER — Non-Acute Institutional Stay (SKILLED_NURSING_FACILITY): Payer: Medicare PPO | Admitting: Internal Medicine

## 2021-10-30 ENCOUNTER — Encounter: Payer: Self-pay | Admitting: Internal Medicine

## 2021-10-30 DIAGNOSIS — F01518 Vascular dementia, unspecified severity, with other behavioral disturbance: Secondary | ICD-10-CM | POA: Diagnosis not present

## 2021-10-30 DIAGNOSIS — C50111 Malignant neoplasm of central portion of right female breast: Secondary | ICD-10-CM

## 2021-10-30 DIAGNOSIS — C9000 Multiple myeloma not having achieved remission: Secondary | ICD-10-CM

## 2021-10-30 DIAGNOSIS — Z170 Estrogen receptor positive status [ER+]: Secondary | ICD-10-CM

## 2021-10-30 DIAGNOSIS — N186 End stage renal disease: Secondary | ICD-10-CM

## 2021-10-30 DIAGNOSIS — F339 Major depressive disorder, recurrent, unspecified: Secondary | ICD-10-CM | POA: Diagnosis not present

## 2021-10-30 DIAGNOSIS — E1122 Type 2 diabetes mellitus with diabetic chronic kidney disease: Secondary | ICD-10-CM | POA: Diagnosis not present

## 2021-10-30 DIAGNOSIS — D509 Iron deficiency anemia, unspecified: Secondary | ICD-10-CM

## 2021-10-30 NOTE — Assessment & Plan Note (Addendum)
Macrocytosis with stable to slightly improved anemia; H/H 10/32. Oncology is monitoring her CBC every other week.

## 2021-10-30 NOTE — Assessment & Plan Note (Signed)
Affect is flat although she does describe being "lonely." She does exhibit some confabulation but no significant behavioral issues.  No change in medical regimen indicated.

## 2021-10-30 NOTE — Patient Instructions (Signed)
See assessment and plan under each diagnosis in the problem list and acutely for this visit 

## 2021-10-30 NOTE — Assessment & Plan Note (Addendum)
She is concerned why her sister [name] died recently.  The NP has discussed that her sister died of cancer. She admits to being lonely.  Clinically affect is flat and depression is stable.

## 2021-10-30 NOTE — Assessment & Plan Note (Signed)
10/28/2021 oncology note reviewed.  She will continue the Velcade with dexamethasone 10 mg every other week.

## 2021-10-30 NOTE — Assessment & Plan Note (Signed)
Anastrozole as maintenance.

## 2021-10-30 NOTE — Assessment & Plan Note (Signed)
Recently recorded glucoses range from 175 up to 213 but A1c was 5.3% on 10/10/2021.  This suggests discordance due to the ESRD.

## 2021-10-30 NOTE — Progress Notes (Signed)
NURSING HOME LOCATION:  [facility] ROOM NUMBER:  [id number]  CODE STATUS:  DNR  PCP:  Ok Edwards NP  This is a nursing facility follow up visit of chronic medical diagnoses & to document compliance with Regulation 483.30 (c) in [employer] [place] Manual Phase 2 which mandates caregiver visit ( visits can alternate among physician, PA or NP as per statutes) within 10 days of 30 days / 60 days/ 90 days post admission to SNF date    Interim medical record and care since last SNF visit was updated with review of diagnostic studies and change in clinical status since last visit were documented.  HPI: She is a permanent resident of this facility with medical diagnoses of breast cancer on oral chemotherapy, diabetes with ESRD, GERD, glaucoma, dyslipidemia, essential hypertension, and multiple myeloma. Significant procedures and surgeries include AV fistula placement for dialysis, colonoscopy, EGD, esophageal dilation, partial right mastectomy, and peripheral vascular balloon angioplasty.  10/28/2021 Oncology note was reviewed; she received cycle of Velcade which has been tolerated every other week without complication. Also dexamethasone 10 mg is administered with Velcade.   Anastrozole was continued for stage I infiltrating ductal carcinoma, s/p partial right mastectomy.  Most recent labs reveal ESRD with a creatinine of 6.50 and GFR of 6.  She receives hemodialysis 3 days a week.  Macrocytic anemia is essentially stable with H/H of 10/32.  Recorded glucoses range from 175 up to 213.  A1c was 5.3% on 10/10/2021.  This suggests discordance due to the ESRD.  Review of systems: Dementia invalidated responses.  She states that she "will throw up" after breakfast approximately 1 time a month.  She seemed to be unaware of her oncologic appointment 822.  She did state that she would have "pills and a shot on the fourth floor.".  She describes occasional numbness in the fingers of her  right hand.  She is concerned as to why her Sister [name] died.  She does state that she gets lonely even though her sister, Wilford Sports, is a roommate.  She was unaware that her sister was in the room thinking she had left the facility.  Constitutional: No fever, significant weight change, fatigue  Eyes: No redness, discharge, pain, vision change ENT/mouth: No nasal congestion,  purulent discharge, earache, change in hearing, sore throat  Cardiovascular: No chest pain, palpitations, paroxysmal nocturnal dyspnea, claudication, edema  Respiratory: No cough, sputum production, hemoptysis, DOE, significant snoring, apnea   Gastrointestinal: No heartburn, dysphagia, abdominal pain, rectal bleeding, melena, change in bowels Genitourinary: No dysuria, hematuria, pyuria, incontinence, nocturia Musculoskeletal: No joint stiffness, joint swelling, weakness, pain Dermatologic: No rash, pruritus, change in appearance of skin Neurologic: No dizziness, headache, syncope, seizures Psychiatric: No significant insomnia, anorexia Endocrine: No change in hair/skin/nails, excessive thirst, excessive hunger, excessive urination  Hematologic/lymphatic: No significant bruising, lymphadenopathy, abnormal bleeding Allergy/immunology: No itchy/watery eyes, significant sneezing, urticaria, angioedema  Physical exam:  Pertinent or positive findings: Affect is flat.  She is slightly hoarse.  There is slight exotropia of the right eye.  There is micrognathia to some degree of the mandible.  S1 is slightly increased.  She has mild low-grade rales in the left lower lobe.  Abdomen is protuberant.  She has nonpitting edema.  Pedal pulses are decreased.  General appearance: Adequately nourished; no acute distress, increased work of breathing is present.   Lymphatic: No lymphadenopathy about the head, neck, axilla. Eyes: No conjunctival inflammation or lid edema is present. There is  no scleral icterus. Ears:  External ear exam  shows no significant lesions or deformities.   Nose:  External nasal examination shows no deformity or inflammation. Nasal mucosa are pink and moist without lesions, exudates Oral exam:  Lips and gums are healthy appearing. There is no oropharyngeal erythema or exudate. Neck:  No thyromegaly, masses, tenderness noted.    Heart:  Normal rate and regular rhythm. S2 normal without gallop, murmur, click, rub .  Lungs:  without wheezes, rhonchi, rubs. Abdomen: Bowel sounds are normal. Abdomen is soft and nontender with no organomegaly, hernias, masses. GU: Deferred  Extremities:  No cyanosis, clubbing  Neurologic exam :Balance, Rhomberg, finger to nose testing could not be completed due to clinical state Skin: Warm & dry w/o tenting. No significant lesions or rash.  See summary under each active problem in the Problem List with associated updated therapeutic plan

## 2021-10-31 ENCOUNTER — Other Ambulatory Visit: Payer: Self-pay

## 2021-10-31 ENCOUNTER — Other Ambulatory Visit (HOSPITAL_COMMUNITY): Payer: Self-pay | Admitting: Specialist

## 2021-10-31 DIAGNOSIS — R1312 Dysphagia, oropharyngeal phase: Secondary | ICD-10-CM

## 2021-10-31 DIAGNOSIS — K219 Gastro-esophageal reflux disease without esophagitis: Secondary | ICD-10-CM

## 2021-11-01 DIAGNOSIS — Z992 Dependence on renal dialysis: Secondary | ICD-10-CM | POA: Diagnosis not present

## 2021-11-01 DIAGNOSIS — I120 Hypertensive chronic kidney disease with stage 5 chronic kidney disease or end stage renal disease: Secondary | ICD-10-CM | POA: Diagnosis not present

## 2021-11-01 DIAGNOSIS — N186 End stage renal disease: Secondary | ICD-10-CM | POA: Diagnosis not present

## 2021-11-01 DIAGNOSIS — R1312 Dysphagia, oropharyngeal phase: Secondary | ICD-10-CM | POA: Diagnosis not present

## 2021-11-02 DIAGNOSIS — R1312 Dysphagia, oropharyngeal phase: Secondary | ICD-10-CM | POA: Diagnosis not present

## 2021-11-02 DIAGNOSIS — I120 Hypertensive chronic kidney disease with stage 5 chronic kidney disease or end stage renal disease: Secondary | ICD-10-CM | POA: Diagnosis not present

## 2021-11-03 DIAGNOSIS — Z992 Dependence on renal dialysis: Secondary | ICD-10-CM | POA: Diagnosis not present

## 2021-11-03 DIAGNOSIS — N186 End stage renal disease: Secondary | ICD-10-CM | POA: Diagnosis not present

## 2021-11-04 ENCOUNTER — Ambulatory Visit (HOSPITAL_COMMUNITY): Payer: Medicare PPO | Attending: Adult Health | Admitting: Speech Pathology

## 2021-11-04 ENCOUNTER — Encounter (HOSPITAL_COMMUNITY): Payer: Self-pay | Admitting: Speech Pathology

## 2021-11-04 ENCOUNTER — Ambulatory Visit (HOSPITAL_COMMUNITY)
Admission: RE | Admit: 2021-11-04 | Discharge: 2021-11-04 | Disposition: A | Discharge status: Home / Self Care | Payer: Medicare PPO | Source: Ambulatory Visit | Attending: Adult Health | Admitting: Adult Health

## 2021-11-04 DIAGNOSIS — K219 Gastro-esophageal reflux disease without esophagitis: Secondary | ICD-10-CM | POA: Insufficient documentation

## 2021-11-04 DIAGNOSIS — R1312 Dysphagia, oropharyngeal phase: Secondary | ICD-10-CM | POA: Diagnosis not present

## 2021-11-04 DIAGNOSIS — R6339 Other feeding difficulties: Secondary | ICD-10-CM | POA: Diagnosis not present

## 2021-11-04 DIAGNOSIS — I120 Hypertensive chronic kidney disease with stage 5 chronic kidney disease or end stage renal disease: Secondary | ICD-10-CM | POA: Diagnosis not present

## 2021-11-04 DIAGNOSIS — R1310 Dysphagia, unspecified: Secondary | ICD-10-CM | POA: Diagnosis not present

## 2021-11-04 NOTE — Therapy (Signed)
[place], [place], [address] Phone: [phone]   Fax:  [phone]  Modified Barium Swallow  Patient Details  Name: Carly Wood MRN: [id number] Date of Birth: 1935/03/23 No data recorded  Encounter Date: 11/04/2021   End of Session - 11/04/21 1641     Visit Number 1    Number of Visits 1    Authorization Type [employer]    SLP Start Time 6063    SLP Stop Time  1407    SLP Time Calculation (min) 29 min    Activity Tolerance Patient tolerated treatment well             Past Medical History:  Diagnosis Date   Anemia     chronic macrocytic anemia   Anxiety    Chronic kidney disease    Chronic renal disease, stage 4, severely decreased glomerular filtration rate (GFR) between 15-29 mL/min/1.73 square meter ([place]) 01/60/1093   Complication of anesthesia    delirious after Breast Surgery   Dementia ([place])    mild   Depression    Diabetes mellitus with ESRD (end-stage renal disease) ([place])    type II   Dysphagia    Dyspnea    with activity   GERD (gastroesophageal reflux disease)    Glaucoma    Hyperlipidemia    Hypertension    Multiple myeloma ([place])    Pneumonia    Stage 1 infiltrating ductal carcinoma of right female breast ([place]) 08/21/2015   ER+ PR+ HER 2 neu + (3+) T1cN0     Past Surgical History:  Procedure Laterality Date   A/V FISTULAGRAM Right 07/05/2020   Procedure: A/V Fistulagram;  Surgeon: Elam Dutch, MD;  Location: [facility];  Service: Cardiovascular;  Laterality: Right;   AV FISTULA PLACEMENT Left 11/22/2017   Procedure: ARTERIOVENOUS (AV) FISTULA CREATION LEFT ARM;  Surgeon: Elam Dutch, MD;  Location: [facility] OR;  Service: Vascular;  Laterality: Left;   AV FISTULA PLACEMENT Right 04/04/2020   Procedure: RIGHT ARM ARTERIOVENOUS FISTULA CREATION;  Surgeon: Rosetta Posner, MD;  Location: [facility];  Service: Vascular;  Laterality: Right;   AV FISTULA PLACEMENT  Right 08/20/2020   Procedure: ARTERIOVENOUS (AV) FISTULA LIGATION RIGHT ARM;  Surgeon: Elam Dutch, MD;  Location: [facility] OR;  Service: Vascular;  Laterality: Right;   BIOPSY  08/07/2016   Procedure: BIOPSY;  Surgeon: Daneil Dolin, MD;  [facility];  Service: Endoscopy;;  gastric ulcer biopsy   COLONOSCOPY     ESOPHAGOGASTRODUODENOSCOPY N/A 08/07/2016   LA Grade A esophagitis s/p dilation, small hiatal hernia, multiple gastric ulcers and erosions, duodenal erosions s/p biopsy. Negative H.pylori    ESOPHAGOGASTRODUODENOSCOPY N/A 11/27/2016   normal esophagus, previously noted gastric ulcers completely healed, normal duodenum.    ESOPHAGOGASTRODUODENOSCOPY (EGD) WITH PROPOFOL N/A 07/23/2020   Procedure: ESOPHAGOGASTRODUODENOSCOPY (EGD) WITH PROPOFOL;  Surgeon: Rogene Houston, MD;  [facility];  Service: Endoscopy;  Laterality: N/A;   FISTULA SUPERFICIALIZATION Left 02/14/2018   Procedure: FISTULA SUPERFICIALIZATION LEFT ARM;  Surgeon: Angelia Mould, MD;  Location: [facility] OR;  Service: Vascular;  Laterality: Left;   FRACTURE SURGERY Right    ankle   HOT HEMOSTASIS  07/23/2020   Procedure: HOT HEMOSTASIS (ARGON PLASMA COAGULATION/BICAP);  Surgeon: Rogene Houston, MD;  [facility];  Service: Endoscopy;;   INTRAMEDULLARY (IM) NAIL INTERTROCHANTERIC Right 07/12/2020   Procedure: INTRAMEDULLARY (IM) NAIL INTERTROCHANTRIC;  Surgeon: Larena Glassman  A, MD;  Location: [facility];  Service: Orthopedics;  Laterality: Right;   MALONEY DILATION N/A 08/07/2016   Procedure: Venia Minks DILATION;  Surgeon: Daneil Dolin, MD;  [facility];  Service: Endoscopy;  Laterality: N/A;   MASTECTOMY, PARTIAL Right    PERIPHERAL VASCULAR BALLOON ANGIOPLASTY Left 07/13/2019   Procedure: PERIPHERAL VASCULAR BALLOON ANGIOPLASTY;  Surgeon: Marty Heck, MD;  Location: [facility];  Service: Cardiovascular;  Laterality: Left;  arm fistulogram   PERIPHERAL  VASCULAR BALLOON ANGIOPLASTY Right 05/22/2020   Procedure: PERIPHERAL VASCULAR BALLOON ANGIOPLASTY;  Surgeon: Cherre Robins, MD;  Location: [facility];  Service: Cardiovascular;  Laterality: Right;  arm fistula   PERIPHERAL VASCULAR BALLOON ANGIOPLASTY Right 07/05/2020   Procedure: PERIPHERAL VASCULAR BALLOON ANGIOPLASTY;  Surgeon: Elam Dutch, MD;  Location: [facility];  Service: Cardiovascular;  Laterality: Right;  arm fistula   PORT-A-CATH REMOVAL Left 11/22/2017   Procedure: REMOVAL PORT-A-CATH LEFT CHEST;  Surgeon: Elam Dutch, MD;  Location: [facility];  Service: Vascular;  Laterality: Left;   RETINAL DETACHMENT SURGERY Right    SCLEROTHERAPY  07/23/2020   Procedure: SCLEROTHERAPY;  Surgeon: Rogene Houston, MD;  [facility];  Service: Endoscopy;;   UPPER EXTREMITY VENOGRAPHY N/A 08/22/2020   Procedure: LEFT UPPER & CENTRAL VENOGRAPHY;  Surgeon: Marty Heck, MD;  Location: [facility];  Service: Cardiovascular;  Laterality: N/A;    There were no vitals filed for this visit.        General - 11/04/21 1639       General Information   Date of Onset 10/29/21    HPI Carly Wood is an 86 yo female who was referred by Ok Edwards, NP from [facility] for MBSS due to Pt with reports of dysphagia    Type of Study MBS-Modified Barium Swallow Study    Diet Prior to this Study Regular;Thin liquids    Temperature Spikes Noted No    Respiratory Status Room air    History of Recent Intubation No    Behavior/Cognition Alert;Cooperative;Pleasant mood    Oral Cavity Assessment Within Functional Limits    Oral Care Completed by SLP No    Vision Functional for self feeding    Self-Feeding Abilities Able to feed self;Needs set up    Patient Positioning Upright in chair    Baseline Vocal Quality Normal   mild wet quality before study   Volitional Cough Strong    Volitional Swallow Able to elicit    Anatomy Within functional limits     Pharyngeal Secretions Not observed secondary MBS                Oral Preparation/Oral Phase - 11/04/21 1640       Oral Preparation/Oral Phase   Oral Phase Within functional limits              Pharyngeal Phase - 11/04/21 1640       Pharyngeal Phase   Pharyngeal Phase Impaired              Cricopharyngeal Phase - 11/04/21 1642       Cervical Esophageal Phase   Cervical Esophageal Phase Within functional limits                      Plan - 11/04/21 1641     Clinical Impression Statement Pt presents with    Consulted and Agree with Plan of Care Patient  Patient will benefit from skilled therapeutic intervention in order to improve the following deficits and impairments:   Dysphagia, oropharyngeal phase     Recommendations/Treatment - 11/04/21 1642       Swallow Evaluation Recommendations   SLP Diet Recommendations Thin;Age appropriate regular    Liquid Administration via Cup;Straw    Medication Administration Whole meds with liquid    Supervision Patient able to self feed    Compensations Multiple dry swallows after each bite/sip    Postural Changes Seated upright at 90 degrees;Remain upright for at least 30 minutes after feeds/meals               Problem List Patient Active Problem List   Diagnosis Date Noted   Elevated LFTs 10/03/2021   Syncope 10/01/2021   Elevated liver enzymes 10/01/2021   Breast cancer ([place]) 10/01/2021   Calculus of gallbladder without cholecystitis without obstruction    History of hip fracture 07/03/2021   SARS-CoV-2 positive 05/02/2021   Edema 02/06/2021   Type 2 diabetes mellitus with hypertension and end stage renal disease on dialysis ([place]) 12/19/2020   Protein-calorie malnutrition, severe ([place]) 07/31/2020   Duodenal ulcer    Acute upper GI bleed 07/22/2020   Rectal bleeding 07/22/2020   Major depression, recurrent, chronic ([place]) 07/18/2020   ESRD (end stage renal disease)  ([place]) 07/13/2020   Hyperlipidemia associated with type 2 diabetes mellitus ([place]) 06/19/2020   Aortic atherosclerosis ([place]) 04/01/2020   Atherosclerotic peripheral vascular disease ([place]) 04/01/2020   Hyperkalemia 12/25/2019   Dependence on renal dialysis ([place]) 10/25/2019   Chronic kidney disease with end stage renal disease on dialysis due to type 2 diabetes mellitus ([place]) 10/19/2019   Hypertension due to end stage renal disease caused by type 2 diabetes mellitus, on dialysis ([place]) 10/19/2019   Anemia due to end stage renal disease ([place]) 10/19/2019   Herpes 10/19/2019   Vascular dementia with behavior disturbance ([place]) 10/19/2019   Diabetes mellitus with ESRD (end-stage renal disease) ([place]) 10/12/2019   Multiple myeloma ([place]) 10/04/2018   MGUS (monoclonal gammopathy of unknown significance) 09/20/2018   Iron deficiency anemia 05/03/2017   Multiple gastric ulcers 11/10/2016   Dysphagia 07/31/2016   GERD (gastroesophageal reflux disease) 07/31/2016   Lymphedema of arm 08/22/2015   Osteopenia determined by x-ray 08/22/2015   Stage 1 infiltrating ductal carcinoma of right female breast ([place]) 08/21/2015    Carly Wood, [place] 11/04/2021, 4:43 PM  [place], [place], [address] Phone: [phone]   Fax:  [phone]  Name: Carly Wood MRN: [id number] Date of Birth: [birth year]

## 2021-11-05 DIAGNOSIS — N186 End stage renal disease: Secondary | ICD-10-CM | POA: Diagnosis not present

## 2021-11-05 DIAGNOSIS — Z992 Dependence on renal dialysis: Secondary | ICD-10-CM | POA: Diagnosis not present

## 2021-11-06 DIAGNOSIS — Z992 Dependence on renal dialysis: Secondary | ICD-10-CM | POA: Diagnosis not present

## 2021-11-06 DIAGNOSIS — N186 End stage renal disease: Secondary | ICD-10-CM | POA: Diagnosis not present

## 2021-11-07 ENCOUNTER — Other Ambulatory Visit: Payer: Self-pay

## 2021-11-07 DIAGNOSIS — N2581 Secondary hyperparathyroidism of renal origin: Secondary | ICD-10-CM | POA: Diagnosis not present

## 2021-11-07 DIAGNOSIS — Z992 Dependence on renal dialysis: Secondary | ICD-10-CM | POA: Diagnosis not present

## 2021-11-07 DIAGNOSIS — N186 End stage renal disease: Secondary | ICD-10-CM | POA: Diagnosis not present

## 2021-11-10 DIAGNOSIS — N2581 Secondary hyperparathyroidism of renal origin: Secondary | ICD-10-CM | POA: Diagnosis not present

## 2021-11-10 DIAGNOSIS — Z992 Dependence on renal dialysis: Secondary | ICD-10-CM | POA: Diagnosis not present

## 2021-11-10 DIAGNOSIS — N186 End stage renal disease: Secondary | ICD-10-CM | POA: Diagnosis not present

## 2021-11-11 ENCOUNTER — Inpatient Hospital Stay: Payer: Medicare PPO

## 2021-11-11 ENCOUNTER — Other Ambulatory Visit: Payer: Self-pay | Admitting: Hematology

## 2021-11-11 DIAGNOSIS — C9000 Multiple myeloma not having achieved remission: Secondary | ICD-10-CM

## 2021-11-12 ENCOUNTER — Ambulatory Visit: Payer: Medicare PPO

## 2021-11-12 ENCOUNTER — Inpatient Hospital Stay: Payer: Medicare PPO

## 2021-11-12 DIAGNOSIS — N2581 Secondary hyperparathyroidism of renal origin: Secondary | ICD-10-CM | POA: Diagnosis not present

## 2021-11-12 DIAGNOSIS — N186 End stage renal disease: Secondary | ICD-10-CM | POA: Diagnosis not present

## 2021-11-12 DIAGNOSIS — Z992 Dependence on renal dialysis: Secondary | ICD-10-CM | POA: Diagnosis not present

## 2021-11-14 DIAGNOSIS — N186 End stage renal disease: Secondary | ICD-10-CM | POA: Diagnosis not present

## 2021-11-14 DIAGNOSIS — N2581 Secondary hyperparathyroidism of renal origin: Secondary | ICD-10-CM | POA: Diagnosis not present

## 2021-11-14 DIAGNOSIS — Z992 Dependence on renal dialysis: Secondary | ICD-10-CM | POA: Diagnosis not present

## 2021-11-15 ENCOUNTER — Other Ambulatory Visit: Payer: Self-pay

## 2021-11-17 DIAGNOSIS — N2581 Secondary hyperparathyroidism of renal origin: Secondary | ICD-10-CM | POA: Diagnosis not present

## 2021-11-17 DIAGNOSIS — N186 End stage renal disease: Secondary | ICD-10-CM | POA: Diagnosis not present

## 2021-11-17 DIAGNOSIS — Z992 Dependence on renal dialysis: Secondary | ICD-10-CM | POA: Diagnosis not present

## 2021-11-19 DIAGNOSIS — N186 End stage renal disease: Secondary | ICD-10-CM | POA: Diagnosis not present

## 2021-11-19 DIAGNOSIS — Z992 Dependence on renal dialysis: Secondary | ICD-10-CM | POA: Diagnosis not present

## 2021-11-19 DIAGNOSIS — N2581 Secondary hyperparathyroidism of renal origin: Secondary | ICD-10-CM | POA: Diagnosis not present

## 2021-11-21 DIAGNOSIS — N186 End stage renal disease: Secondary | ICD-10-CM | POA: Diagnosis not present

## 2021-11-21 DIAGNOSIS — Z992 Dependence on renal dialysis: Secondary | ICD-10-CM | POA: Diagnosis not present

## 2021-11-21 DIAGNOSIS — N2581 Secondary hyperparathyroidism of renal origin: Secondary | ICD-10-CM | POA: Diagnosis not present

## 2021-11-24 DIAGNOSIS — N2581 Secondary hyperparathyroidism of renal origin: Secondary | ICD-10-CM | POA: Diagnosis not present

## 2021-11-24 DIAGNOSIS — N186 End stage renal disease: Secondary | ICD-10-CM | POA: Diagnosis not present

## 2021-11-24 DIAGNOSIS — Z992 Dependence on renal dialysis: Secondary | ICD-10-CM | POA: Diagnosis not present

## 2021-11-25 ENCOUNTER — Inpatient Hospital Stay: Payer: Medicare PPO

## 2021-11-25 ENCOUNTER — Inpatient Hospital Stay: Payer: Medicare PPO | Attending: Hematology

## 2021-11-25 VITALS — BP 121/68 | HR 88 | Temp 98.2°F | Resp 18 | Wt 134.9 lb

## 2021-11-25 DIAGNOSIS — C9000 Multiple myeloma not having achieved remission: Secondary | ICD-10-CM | POA: Diagnosis not present

## 2021-11-25 DIAGNOSIS — Z5112 Encounter for antineoplastic immunotherapy: Secondary | ICD-10-CM | POA: Insufficient documentation

## 2021-11-25 LAB — CBC WITH DIFFERENTIAL/PLATELET
Abs Immature Granulocytes: 0.05 10*3/uL (ref 0.00–0.07)
Basophils Absolute: 0 10*3/uL (ref 0.0–0.1)
Basophils Relative: 1 %
Eosinophils Absolute: 0.1 10*3/uL (ref 0.0–0.5)
Eosinophils Relative: 2 %
HCT: 30.7 % — ABNORMAL LOW (ref 36.0–46.0)
Hemoglobin: 9.4 g/dL — ABNORMAL LOW (ref 12.0–15.0)
Immature Granulocytes: 1 %
Lymphocytes Relative: 16 %
Lymphs Abs: 0.8 10*3/uL (ref 0.7–4.0)
MCH: 30.8 pg (ref 26.0–34.0)
MCHC: 30.6 g/dL (ref 30.0–36.0)
MCV: 100.7 fL — ABNORMAL HIGH (ref 80.0–100.0)
Monocytes Absolute: 0.6 10*3/uL (ref 0.1–1.0)
Monocytes Relative: 13 %
Neutro Abs: 3.3 10*3/uL (ref 1.7–7.7)
Neutrophils Relative %: 67 %
Platelets: 237 10*3/uL (ref 150–400)
RBC: 3.05 MIL/uL — ABNORMAL LOW (ref 3.87–5.11)
RDW: 16.5 % — ABNORMAL HIGH (ref 11.5–15.5)
WBC: 4.9 10*3/uL (ref 4.0–10.5)
nRBC: 0 % (ref 0.0–0.2)

## 2021-11-25 LAB — COMPREHENSIVE METABOLIC PANEL
ALT: 11 U/L (ref 0–44)
AST: 19 U/L (ref 15–41)
Albumin: 3.5 g/dL (ref 3.5–5.0)
Alkaline Phosphatase: 72 U/L (ref 38–126)
Anion gap: 13 (ref 5–15)
BUN: 27 mg/dL — ABNORMAL HIGH (ref 8–23)
CO2: 26 mmol/L (ref 22–32)
Calcium: 9 mg/dL (ref 8.9–10.3)
Chloride: 98 mmol/L (ref 98–111)
Creatinine, Ser: 6.88 mg/dL — ABNORMAL HIGH (ref 0.44–1.00)
GFR, Estimated: 6 mL/min — ABNORMAL LOW (ref >60)
Glucose, Bld: 171 mg/dL — ABNORMAL HIGH (ref 70–99)
Potassium: 4.4 mmol/L (ref 3.5–5.1)
Sodium: 137 mmol/L (ref 135–145)
Total Bilirubin: 0.4 mg/dL (ref 0.3–1.2)
Total Protein: 6.4 g/dL — ABNORMAL LOW (ref 6.5–8.1)

## 2021-11-25 MED ORDER — BORTEZOMIB CHEMO SQ INJECTION 3.5 MG (2.5MG/ML)
2.0000 mg | Freq: Once | INTRAMUSCULAR | Status: AC
  Administered 2021-11-25: 2 mg via SUBCUTANEOUS
  Filled 2021-11-25: qty 0.8

## 2021-11-25 MED ORDER — PROCHLORPERAZINE MALEATE 10 MG PO TABS
10.0000 mg | ORAL_TABLET | Freq: Once | ORAL | Status: AC
  Administered 2021-11-25: 10 mg via ORAL
  Filled 2021-11-25: qty 1

## 2021-11-25 NOTE — Progress Notes (Signed)
Ok to proceed with elevated SCR of 6.81 per Burns Spain, NP today.

## 2021-11-25 NOTE — Patient Instructions (Signed)
[facility]  Discharge Instructions: Thank you for choosing [facility] to provide your oncology and hematology care.  If you have a lab appointment with the Cancer Center, please come in thru the Main Entrance and check in at the main information desk.  Wear comfortable clothing and clothing appropriate for easy access to any Portacath or PICC line.   We strive to give you quality time with your provider. You may need to reschedule your appointment if you arrive late (15 or more minutes).  Arriving late affects you and other patients whose appointments are after yours.  Also, if you miss three or more appointments without notifying the office, you may be dismissed from the clinic at the provider's discretion.      For prescription refill requests, have your pharmacy contact our office and allow 72 hours for refills to be completed.    Today you received the following chemotherapy and/or immunotherapy agents Velcade, return as scheduled.   To help prevent nausea and vomiting after your treatment, we encourage you to take your nausea medication as directed.  BELOW ARE SYMPTOMS THAT SHOULD BE REPORTED IMMEDIATELY: *FEVER GREATER THAN 100.4 F (38 C) OR HIGHER *CHILLS OR SWEATING *NAUSEA AND VOMITING THAT IS NOT CONTROLLED WITH YOUR NAUSEA MEDICATION *UNUSUAL SHORTNESS OF BREATH *UNUSUAL BRUISING OR BLEEDING *URINARY PROBLEMS (pain or burning when urinating, or frequent urination) *BOWEL PROBLEMS (unusual diarrhea, constipation, pain near the anus) TENDERNESS IN MOUTH AND THROAT WITH OR WITHOUT PRESENCE OF ULCERS (sore throat, sores in mouth, or a toothache) UNUSUAL RASH, SWELLING OR PAIN  UNUSUAL VAGINAL DISCHARGE OR ITCHING   Items with * indicate a potential emergency and should be followed up as soon as possible or go to the Emergency Department if any problems should occur.  Please show the CHEMOTHERAPY ALERT CARD or IMMUNOTHERAPY ALERT CARD at  check-in to the Emergency Department and triage nurse.  Should you have questions after your visit or need to cancel or reschedule your appointment, please contact [facility] [phone]  and follow the prompts.  Office hours are 8:00 a.m. to 4:30 p.m. Monday - Friday. Please note that voicemails left after 4:00 p.m. may not be returned until the following business day.  We are closed weekends and major holidays. You have access to a nurse at all times for urgent questions. Please call the main number to the clinic [phone] and follow the prompts.  For any non-urgent questions, you may also contact your provider using MyChart. We now offer e-Visits for anyone 18 and older to request care online for non-urgent symptoms. For details visit [email or web address].   Also download the MyChart app! Go to the app store, search "MyChart", open the app, select [facility], and log in with your MyChart username and password.  Masks are optional in the cancer centers. If you would like for your care team to wear a mask while they are taking care of you, please let them know. You may have one support person who is at least 84 years old accompany you for your appointments.  

## 2021-11-26 DIAGNOSIS — N2581 Secondary hyperparathyroidism of renal origin: Secondary | ICD-10-CM | POA: Diagnosis not present

## 2021-11-26 DIAGNOSIS — N186 End stage renal disease: Secondary | ICD-10-CM | POA: Diagnosis not present

## 2021-11-26 DIAGNOSIS — Z992 Dependence on renal dialysis: Secondary | ICD-10-CM | POA: Diagnosis not present

## 2021-11-28 DIAGNOSIS — Z992 Dependence on renal dialysis: Secondary | ICD-10-CM | POA: Diagnosis not present

## 2021-11-28 DIAGNOSIS — N2581 Secondary hyperparathyroidism of renal origin: Secondary | ICD-10-CM | POA: Diagnosis not present

## 2021-11-28 DIAGNOSIS — N186 End stage renal disease: Secondary | ICD-10-CM | POA: Diagnosis not present

## 2021-12-01 DIAGNOSIS — Z992 Dependence on renal dialysis: Secondary | ICD-10-CM | POA: Diagnosis not present

## 2021-12-01 DIAGNOSIS — N2581 Secondary hyperparathyroidism of renal origin: Secondary | ICD-10-CM | POA: Diagnosis not present

## 2021-12-01 DIAGNOSIS — N186 End stage renal disease: Secondary | ICD-10-CM | POA: Diagnosis not present

## 2021-12-03 DIAGNOSIS — N2581 Secondary hyperparathyroidism of renal origin: Secondary | ICD-10-CM | POA: Diagnosis not present

## 2021-12-03 DIAGNOSIS — N186 End stage renal disease: Secondary | ICD-10-CM | POA: Diagnosis not present

## 2021-12-03 DIAGNOSIS — Z992 Dependence on renal dialysis: Secondary | ICD-10-CM | POA: Diagnosis not present

## 2021-12-05 DIAGNOSIS — Z992 Dependence on renal dialysis: Secondary | ICD-10-CM | POA: Diagnosis not present

## 2021-12-05 DIAGNOSIS — N2581 Secondary hyperparathyroidism of renal origin: Secondary | ICD-10-CM | POA: Diagnosis not present

## 2021-12-05 DIAGNOSIS — N186 End stage renal disease: Secondary | ICD-10-CM | POA: Diagnosis not present

## 2021-12-06 DIAGNOSIS — N186 End stage renal disease: Secondary | ICD-10-CM | POA: Diagnosis not present

## 2021-12-06 DIAGNOSIS — Z992 Dependence on renal dialysis: Secondary | ICD-10-CM | POA: Diagnosis not present

## 2021-12-08 ENCOUNTER — Other Ambulatory Visit: Payer: Self-pay

## 2021-12-08 DIAGNOSIS — N2581 Secondary hyperparathyroidism of renal origin: Secondary | ICD-10-CM | POA: Diagnosis not present

## 2021-12-08 DIAGNOSIS — C9000 Multiple myeloma not having achieved remission: Secondary | ICD-10-CM

## 2021-12-08 DIAGNOSIS — N186 End stage renal disease: Secondary | ICD-10-CM | POA: Diagnosis not present

## 2021-12-08 DIAGNOSIS — Z992 Dependence on renal dialysis: Secondary | ICD-10-CM | POA: Diagnosis not present

## 2021-12-09 ENCOUNTER — Inpatient Hospital Stay: Payer: Medicare PPO | Attending: Hematology

## 2021-12-09 ENCOUNTER — Other Ambulatory Visit: Payer: Self-pay

## 2021-12-09 ENCOUNTER — Inpatient Hospital Stay: Payer: Medicare PPO

## 2021-12-09 DIAGNOSIS — M8580 Other specified disorders of bone density and structure, unspecified site: Secondary | ICD-10-CM | POA: Insufficient documentation

## 2021-12-09 DIAGNOSIS — C50411 Malignant neoplasm of upper-outer quadrant of right female breast: Secondary | ICD-10-CM | POA: Insufficient documentation

## 2021-12-09 DIAGNOSIS — Z992 Dependence on renal dialysis: Secondary | ICD-10-CM | POA: Insufficient documentation

## 2021-12-09 DIAGNOSIS — Z79624 Long term (current) use of inhibitors of nucleotide synthesis: Secondary | ICD-10-CM | POA: Insufficient documentation

## 2021-12-09 DIAGNOSIS — Z79811 Long term (current) use of aromatase inhibitors: Secondary | ICD-10-CM | POA: Insufficient documentation

## 2021-12-09 DIAGNOSIS — Z23 Encounter for immunization: Secondary | ICD-10-CM | POA: Diagnosis not present

## 2021-12-09 DIAGNOSIS — Z9011 Acquired absence of right breast and nipple: Secondary | ICD-10-CM | POA: Insufficient documentation

## 2021-12-09 DIAGNOSIS — Z170 Estrogen receptor positive status [ER+]: Secondary | ICD-10-CM | POA: Insufficient documentation

## 2021-12-09 DIAGNOSIS — Z5112 Encounter for antineoplastic immunotherapy: Secondary | ICD-10-CM | POA: Insufficient documentation

## 2021-12-09 DIAGNOSIS — E1122 Type 2 diabetes mellitus with diabetic chronic kidney disease: Secondary | ICD-10-CM | POA: Insufficient documentation

## 2021-12-09 DIAGNOSIS — Z794 Long term (current) use of insulin: Secondary | ICD-10-CM | POA: Insufficient documentation

## 2021-12-09 DIAGNOSIS — Z79899 Other long term (current) drug therapy: Secondary | ICD-10-CM | POA: Insufficient documentation

## 2021-12-09 DIAGNOSIS — C9000 Multiple myeloma not having achieved remission: Secondary | ICD-10-CM | POA: Insufficient documentation

## 2021-12-09 DIAGNOSIS — D649 Anemia, unspecified: Secondary | ICD-10-CM | POA: Insufficient documentation

## 2021-12-09 DIAGNOSIS — N186 End stage renal disease: Secondary | ICD-10-CM | POA: Insufficient documentation

## 2021-12-10 ENCOUNTER — Encounter: Payer: Self-pay | Admitting: Adult Health

## 2021-12-10 ENCOUNTER — Non-Acute Institutional Stay (SKILLED_NURSING_FACILITY): Payer: Medicare PPO | Admitting: Adult Health

## 2021-12-10 ENCOUNTER — Other Ambulatory Visit: Payer: Self-pay

## 2021-12-10 DIAGNOSIS — M810 Age-related osteoporosis without current pathological fracture: Secondary | ICD-10-CM

## 2021-12-10 DIAGNOSIS — B009 Herpesviral infection, unspecified: Secondary | ICD-10-CM

## 2021-12-10 DIAGNOSIS — C9000 Multiple myeloma not having achieved remission: Secondary | ICD-10-CM

## 2021-12-10 DIAGNOSIS — N2581 Secondary hyperparathyroidism of renal origin: Secondary | ICD-10-CM | POA: Diagnosis not present

## 2021-12-10 DIAGNOSIS — Z992 Dependence on renal dialysis: Secondary | ICD-10-CM | POA: Diagnosis not present

## 2021-12-10 DIAGNOSIS — C50111 Malignant neoplasm of central portion of right female breast: Secondary | ICD-10-CM | POA: Diagnosis not present

## 2021-12-10 DIAGNOSIS — Z170 Estrogen receptor positive status [ER+]: Secondary | ICD-10-CM

## 2021-12-10 DIAGNOSIS — N186 End stage renal disease: Secondary | ICD-10-CM | POA: Diagnosis not present

## 2021-12-10 NOTE — Progress Notes (Unsigned)
Location:  [place] Room Number: 148D Place of Service:  SNF (31) Ok Edwards, NP   CODE STATUS: DNR  Allergies  Allergen Reactions   Ace Inhibitors Other (See Comments) and Cough    Tongue swell , ie angioedema   Angiotensin Receptor Blockers     Angioedema with ACE-I   Penicillins Other (See Comments)    Unsure of reaction Has patient had a PCN reaction causing immediate rash, facial/tongue/throat swelling, SOB or lightheadedness with hypotension: Unknown Has patient had a PCN reaction causing severe rash involving mucus membranes or skin necrosis: Unknown Has patient had a PCN reaction that required hospitalization: No Has patient had a PCN reaction occurring within the last 10 years: Unknown If all of the above answers are "NO", then may proceed with Cephalosporin use.     Penicillin G     Chief Complaint  Patient presents with   Medical Management of Chronic Issues    Routine follow up visit   Immunizations    3rd COVID booster due   Quality Metric Gaps    Foot exam    HPI:    Past Medical History:  Diagnosis Date   Anemia     chronic macrocytic anemia   Anxiety    Chronic kidney disease    Chronic renal disease, stage 4, severely decreased glomerular filtration rate (GFR) between 15-29 mL/min/1.73 square meter ([place]) 31/54/0086   Complication of anesthesia    delirious after Breast Surgery   Dementia ([place])    mild   Depression    Diabetes mellitus with ESRD (end-stage renal disease) ([place])    type II   Dysphagia    Dyspnea    with activity   GERD (gastroesophageal reflux disease)    Glaucoma    Hyperlipidemia    Hypertension    Multiple myeloma ([facility])    Pneumonia    Stage 1 infiltrating ductal carcinoma of right female breast ([place]) 08/21/2015   ER+ PR+ HER 2 neu + (3+) T1cN0     Past Surgical History:  Procedure Laterality Date   A/V FISTULAGRAM Right 07/05/2020   Procedure: A/V Fistulagram;  Surgeon: Elam Dutch, MD;  Location: [place] [facility];  Service: Cardiovascular;  Laterality: Right;   AV FISTULA PLACEMENT Left 11/22/2017   Procedure: ARTERIOVENOUS (AV) FISTULA CREATION LEFT ARM;  Surgeon: Elam Dutch, MD;  Location[facility];  Service: Vascular;  Laterality: Left;   AV FISTULA PLACEMENT Right 04/04/2020   Procedure: RIGHT ARM ARTERIOVENOUS FISTULA CREATION;  Surgeon: Rosetta Posner, MD;  Location: [facility];  Service: Vascular;  Laterality: Right;   AV FISTULA PLACEMENT Right 08/20/2020   Procedure: ARTERIOVENOUS (AV) FISTULA LIGATION RIGHT ARM;  Surgeon: Elam Dutch, MD;  Location: [facility] OR;  Service: Vascular;  Laterality: Right;   BIOPSY  08/07/2016   Procedure: BIOPSY;  Surgeon: Daneil Dolin, MD;  [facility];  Service: Endoscopy;;  gastric ulcer biopsy   COLONOSCOPY     ESOPHAGOGASTRODUODENOSCOPY N/A 08/07/2016   LA Grade A esophagitis s/p dilation, small hiatal hernia, multiple gastric ulcers and erosions, duodenal erosions s/p biopsy. Negative H.pylori    ESOPHAGOGASTRODUODENOSCOPY N/A 11/27/2016   normal esophagus, previously noted gastric ulcers completely healed, normal duodenum.    ESOPHAGOGASTRODUODENOSCOPY (EGD) WITH PROPOFOL N/A 07/23/2020   Procedure: ESOPHAGOGASTRODUODENOSCOPY (EGD) WITH PROPOFOL;  Surgeon: Rogene Houston, MD;  [facility];  Service: Endoscopy;  Laterality: N/A;   FISTULA SUPERFICIALIZATION Left 02/14/2018  Procedure: FISTULA SUPERFICIALIZATION LEFT ARM;  Surgeon: Angelia Mould, MD;  Location[facility] OR;  Service: Vascular;  Laterality: Left;   FRACTURE SURGERY Right    ankle   HOT HEMOSTASIS  07/23/2020   Procedure: HOT HEMOSTASIS (ARGON PLASMA COAGULATION/BICAP);  Surgeon: Rogene Houston, MD;  [facility];  Service: Endoscopy;;   INTRAMEDULLARY (IM) NAIL INTERTROCHANTERIC Right 07/12/2020   Procedure: INTRAMEDULLARY (IM) NAIL INTERTROCHANTRIC;  Surgeon: Mordecai Rasmussen, MD;  Location: [facility];   Service: Orthopedics;  Laterality: Right;   MALONEY DILATION N/A 08/07/2016   Procedure: Venia Minks DILATION;  Surgeon: Daneil Dolin, MD;  [facility];  Service: Endoscopy;  Laterality: N/A;   MASTECTOMY, PARTIAL Right    PERIPHERAL VASCULAR BALLOON ANGIOPLASTY Left 07/13/2019   Procedure: PERIPHERAL VASCULAR BALLOON ANGIOPLASTY;  Surgeon: Marty Heck, MD;  Location: [place] [facility];  Service: Cardiovascular;  Laterality: Left;  arm fistulogram   PERIPHERAL VASCULAR BALLOON ANGIOPLASTY Right 05/22/2020   Procedure: PERIPHERAL VASCULAR BALLOON ANGIOPLASTY;  Surgeon: Cherre Robins, MD;  Location: [place] [facility];  Service: Cardiovascular;  Laterality: Right;  arm fistula   PERIPHERAL VASCULAR BALLOON ANGIOPLASTY Right 07/05/2020   Procedure: PERIPHERAL VASCULAR BALLOON ANGIOPLASTY;  Surgeon: Elam Dutch, MD;  Location: [place] [facility];  Service: Cardiovascular;  Laterality: Right;  arm fistula   PORT-A-CATH REMOVAL Left 11/22/2017   Procedure: REMOVAL PORT-A-CATH LEFT CHEST;  Surgeon: Elam Dutch, MD;  Location: [place];  Service: Vascular;  Laterality: Left;   RETINAL DETACHMENT SURGERY Right    SCLEROTHERAPY  07/23/2020   Procedure: SCLEROTHERAPY;  Surgeon: Rogene Houston, MD;  [facility];  Service: Endoscopy;;   UPPER EXTREMITY VENOGRAPHY N/A 08/22/2020   Procedure: LEFT UPPER & CENTRAL VENOGRAPHY;  Surgeon: Marty Heck, MD;  Location: [place] [facility];  Service: Cardiovascular;  Laterality: N/A;    Social History   Socioeconomic History   Marital status: Single    Spouse name: Not on file   Number of children: Not on file   Years of education: Not on file   Highest education level: Not on file  Occupational History   Occupation: retired   Tobacco Use   Smoking status: Never   Smokeless tobacco: Never  Vaping Use   Vaping Use: Never used  Substance and Sexual Activity   Alcohol use: No    Alcohol/week: 0.0  standard drinks of alcohol   Drug use: No   Sexual activity: Never  Other Topics Concern   Not on file  Social History Narrative   Long term resident of SNF    Social Determinants of Health   Financial Resource Strain: [place]  (01/09/2020)   Overall Financial Resource Strain (CARDIA)    Difficulty of Paying Living Expenses: Not very hard  Food Insecurity: No Food Insecurity (01/09/2020)   Hunger Vital Sign    Worried About Running Out of Food in the Last Year: Never true    Ran Out of Food in the Last Year: Never true  Transportation Needs: No Transportation Needs (01/09/2020)   PRAPARE - Hydrologist (Medical): No    Lack of Transportation (Non-Medical): No  Physical Activity: Inactive (01/09/2020)   Exercise Vital Sign    Days of Exercise per Week: 0 days    Minutes of Exercise per Session: 0 min  Stress: No Stress Concern Present (01/09/2020)   [place]  Feeling of Stress : Not at all  Social Connections: Moderately Isolated (01/09/2020)   Social Connection and Isolation Panel [NHANES]    Frequency of Communication with Friends and Family: More than three times a week    Frequency of Social Gatherings with Friends and Family: Once a week    Attends Religious Services: More than 4 times per year    Active Member of [employer] or Organizations: No    Attends Archivist Meetings: Never    Marital Status: Never married  Intimate Partner Violence: Not At Risk (01/09/2020)   Humiliation, Afraid, Rape, and Kick questionnaire    Fear of Current or Ex-Partner: No    Emotionally Abused: No    Physically Abused: No    Sexually Abused: No   Family History  Problem Relation Age of Onset   Multiple myeloma Sister    Brain cancer Sister    Dementia Mother        died at 53   Stroke Mother    Heart failure Mother    Diabetes Mother    Heart disease Father    Prostate cancer  Brother    Colon cancer Neg Hx       VITAL SIGNS BP (!) 152/70   Pulse 86   Temp (!) 97.2 F (36.2 C)   Resp 20   Ht '5\' 3"'  (1.6 m)   Wt 142 lb 9.6 oz (64.7 kg)   SpO2 98%   BMI 25.26 kg/m   Outpatient Encounter Medications as of 12/10/2021  Medication Sig   acetaminophen (TYLENOL) 325 MG tablet Take 650 mg by mouth every 8 (eight) hours.   acyclovir (ZOVIRAX) 200 MG capsule Take 200 mg by mouth in the morning. (0800)   Amino Acids-Protein Hydrolys (FEEDING SUPPLEMENT, PRO-STAT SUGAR FREE 64,) LIQD Take 30 mLs by mouth 3 (three) times daily with meals. (0800, 1200 & 1800)   anastrozole (ARIMIDEX) 1 MG tablet TAKE 1 TABLET BY MOUTH DAILY   Calcium Acetate 667 MG TABS Take 2 tablets by mouth 3 (three) times daily with meals. 1 tablet daily w/ snack   calcium-vitamin D (OSCAL WITH D) 500-200 MG-UNIT tablet Take 1 tablet by mouth in the morning. (0800)   docusate sodium (COLACE) 100 MG capsule Take 100 mg by mouth 2 (two) times daily.   Insulin Pen Needle (BD AUTOSHIELD DUO) 30G X 5 MM MISC by Does not apply route. 3/16"   melatonin 3 MG TABS tablet Take 6 mg by mouth at bedtime. (2000) For Sleep   multivitamin (RENA-VIT) TABS tablet Take 1 tablet by mouth at bedtime. (2100)   ondansetron (ZOFRAN) 4 MG tablet Take 1 tablet (4 mg total) by mouth every 6 (six) hours as needed for nausea.   pantoprazole (PROTONIX) 40 MG tablet Take 40 mg by mouth 2 (two) times daily.   sertraline (ZOLOFT) 25 MG tablet Take 25 mg by mouth daily. At 9 am along with 50 mg tablet for a total of 75 mg daily   sertraline (ZOLOFT) 50 MG tablet Take 50 mg by mouth daily. At 9 am along with 25 mg tablet for a total of 75 mg daily   sucralfate (CARAFATE) 1 GM/10ML suspension Take 1 g by mouth in the morning, at noon, in the evening, and at bedtime. (0800, 1200, 1700 & 2100)   Facility-Administered Encounter Medications as of 12/10/2021  Medication   lanreotide acetate (SOMATULINE DEPOT) 120 MG/0.5ML injection    lanreotide acetate (SOMATULINE DEPOT) 120 MG/0.5ML injection  octreotide (SANDOSTATIN LAR) 30 MG IM injection   octreotide (SANDOSTATIN LAR) 30 MG IM injection     SIGNIFICANT DIAGNOSTIC EXAMS       ASSESSMENT/ PLAN:     Ok Edwards NP [facility]  Contact [phone] Monday through Friday 8am- 5pm  After hours call [phone]

## 2021-12-11 DIAGNOSIS — M810 Age-related osteoporosis without current pathological fracture: Secondary | ICD-10-CM | POA: Insufficient documentation

## 2021-12-12 DIAGNOSIS — N2581 Secondary hyperparathyroidism of renal origin: Secondary | ICD-10-CM | POA: Diagnosis not present

## 2021-12-12 DIAGNOSIS — N186 End stage renal disease: Secondary | ICD-10-CM | POA: Diagnosis not present

## 2021-12-12 DIAGNOSIS — Z992 Dependence on renal dialysis: Secondary | ICD-10-CM | POA: Diagnosis not present

## 2021-12-15 DIAGNOSIS — N2581 Secondary hyperparathyroidism of renal origin: Secondary | ICD-10-CM | POA: Diagnosis not present

## 2021-12-15 DIAGNOSIS — I120 Hypertensive chronic kidney disease with stage 5 chronic kidney disease or end stage renal disease: Secondary | ICD-10-CM | POA: Diagnosis not present

## 2021-12-15 DIAGNOSIS — Z1159 Encounter for screening for other viral diseases: Secondary | ICD-10-CM | POA: Diagnosis not present

## 2021-12-15 DIAGNOSIS — Z992 Dependence on renal dialysis: Secondary | ICD-10-CM | POA: Diagnosis not present

## 2021-12-15 DIAGNOSIS — N186 End stage renal disease: Secondary | ICD-10-CM | POA: Diagnosis not present

## 2021-12-15 DIAGNOSIS — Z1383 Encounter for screening for respiratory disorder NEC: Secondary | ICD-10-CM | POA: Diagnosis not present

## 2021-12-16 ENCOUNTER — Inpatient Hospital Stay: Payer: Medicare PPO

## 2021-12-16 DIAGNOSIS — N186 End stage renal disease: Secondary | ICD-10-CM | POA: Diagnosis not present

## 2021-12-16 DIAGNOSIS — D509 Iron deficiency anemia, unspecified: Secondary | ICD-10-CM

## 2021-12-16 DIAGNOSIS — N184 Chronic kidney disease, stage 4 (severe): Secondary | ICD-10-CM

## 2021-12-16 DIAGNOSIS — M8580 Other specified disorders of bone density and structure, unspecified site: Secondary | ICD-10-CM | POA: Diagnosis not present

## 2021-12-16 DIAGNOSIS — Z5112 Encounter for antineoplastic immunotherapy: Secondary | ICD-10-CM | POA: Diagnosis not present

## 2021-12-16 DIAGNOSIS — C9000 Multiple myeloma not having achieved remission: Secondary | ICD-10-CM | POA: Diagnosis not present

## 2021-12-16 DIAGNOSIS — Z992 Dependence on renal dialysis: Secondary | ICD-10-CM | POA: Diagnosis not present

## 2021-12-16 DIAGNOSIS — D649 Anemia, unspecified: Secondary | ICD-10-CM | POA: Diagnosis not present

## 2021-12-16 DIAGNOSIS — Z79624 Long term (current) use of inhibitors of nucleotide synthesis: Secondary | ICD-10-CM | POA: Diagnosis not present

## 2021-12-16 DIAGNOSIS — Z79899 Other long term (current) drug therapy: Secondary | ICD-10-CM | POA: Diagnosis not present

## 2021-12-16 DIAGNOSIS — C50411 Malignant neoplasm of upper-outer quadrant of right female breast: Secondary | ICD-10-CM | POA: Diagnosis not present

## 2021-12-16 DIAGNOSIS — Z794 Long term (current) use of insulin: Secondary | ICD-10-CM | POA: Diagnosis not present

## 2021-12-16 DIAGNOSIS — Z170 Estrogen receptor positive status [ER+]: Secondary | ICD-10-CM | POA: Diagnosis not present

## 2021-12-16 DIAGNOSIS — Z9011 Acquired absence of right breast and nipple: Secondary | ICD-10-CM | POA: Diagnosis not present

## 2021-12-16 DIAGNOSIS — Z79811 Long term (current) use of aromatase inhibitors: Secondary | ICD-10-CM | POA: Diagnosis not present

## 2021-12-16 DIAGNOSIS — E1122 Type 2 diabetes mellitus with diabetic chronic kidney disease: Secondary | ICD-10-CM | POA: Diagnosis not present

## 2021-12-16 LAB — COMPREHENSIVE METABOLIC PANEL
ALT: 10 U/L (ref 0–44)
AST: 16 U/L (ref 15–41)
Albumin: 3.9 g/dL (ref 3.5–5.0)
Alkaline Phosphatase: 78 U/L (ref 38–126)
Anion gap: 11 (ref 5–15)
BUN: 24 mg/dL — ABNORMAL HIGH (ref 8–23)
CO2: 28 mmol/L (ref 22–32)
Calcium: 9.3 mg/dL (ref 8.9–10.3)
Chloride: 97 mmol/L — ABNORMAL LOW (ref 98–111)
Creatinine, Ser: 6.62 mg/dL — ABNORMAL HIGH (ref 0.44–1.00)
GFR, Estimated: 6 mL/min — ABNORMAL LOW (ref >60)
Glucose, Bld: 119 mg/dL — ABNORMAL HIGH (ref 70–99)
Potassium: 4.2 mmol/L (ref 3.5–5.1)
Sodium: 136 mmol/L (ref 135–145)
Total Bilirubin: 0.5 mg/dL (ref 0.3–1.2)
Total Protein: 6.9 g/dL (ref 6.5–8.1)

## 2021-12-16 LAB — IRON AND TIBC
Iron: 54 ug/dL (ref 28–170)
Saturation Ratios: 39 % — ABNORMAL HIGH (ref 10.4–31.8)
TIBC: 140 ug/dL — ABNORMAL LOW (ref 250–450)
UIBC: 86 ug/dL

## 2021-12-16 LAB — CBC WITH DIFFERENTIAL/PLATELET
Abs Immature Granulocytes: 0.01 10*3/uL (ref 0.00–0.07)
Basophils Absolute: 0 10*3/uL (ref 0.0–0.1)
Basophils Relative: 1 %
Eosinophils Absolute: 0.2 10*3/uL (ref 0.0–0.5)
Eosinophils Relative: 4 %
HCT: 33 % — ABNORMAL LOW (ref 36.0–46.0)
Hemoglobin: 10.1 g/dL — ABNORMAL LOW (ref 12.0–15.0)
Immature Granulocytes: 0 %
Lymphocytes Relative: 23 %
Lymphs Abs: 1.2 10*3/uL (ref 0.7–4.0)
MCH: 30.1 pg (ref 26.0–34.0)
MCHC: 30.6 g/dL (ref 30.0–36.0)
MCV: 98.5 fL (ref 80.0–100.0)
Monocytes Absolute: 0.5 10*3/uL (ref 0.1–1.0)
Monocytes Relative: 9 %
Neutro Abs: 3.3 10*3/uL (ref 1.7–7.7)
Neutrophils Relative %: 63 %
Platelets: 256 10*3/uL (ref 150–400)
RBC: 3.35 MIL/uL — ABNORMAL LOW (ref 3.87–5.11)
RDW: 15.9 % — ABNORMAL HIGH (ref 11.5–15.5)
WBC: 5.3 10*3/uL (ref 4.0–10.5)
nRBC: 0 % (ref 0.0–0.2)

## 2021-12-16 LAB — FERRITIN: Ferritin: 272 ng/mL (ref 11–307)

## 2021-12-17 ENCOUNTER — Encounter: Payer: Self-pay | Admitting: Adult Health

## 2021-12-17 ENCOUNTER — Non-Acute Institutional Stay (SKILLED_NURSING_FACILITY): Payer: Medicare PPO | Admitting: Adult Health

## 2021-12-17 DIAGNOSIS — E1122 Type 2 diabetes mellitus with diabetic chronic kidney disease: Secondary | ICD-10-CM | POA: Diagnosis not present

## 2021-12-17 DIAGNOSIS — N2581 Secondary hyperparathyroidism of renal origin: Secondary | ICD-10-CM | POA: Diagnosis not present

## 2021-12-17 DIAGNOSIS — Z992 Dependence on renal dialysis: Secondary | ICD-10-CM

## 2021-12-17 DIAGNOSIS — I120 Hypertensive chronic kidney disease with stage 5 chronic kidney disease or end stage renal disease: Secondary | ICD-10-CM

## 2021-12-17 DIAGNOSIS — N186 End stage renal disease: Secondary | ICD-10-CM

## 2021-12-17 LAB — KAPPA/LAMBDA LIGHT CHAINS
Kappa free light chain: 148.7 mg/L — ABNORMAL HIGH (ref 3.3–19.4)
Kappa, lambda light chain ratio: 0.33 (ref 0.26–1.65)
Lambda free light chains: 445.5 mg/L — ABNORMAL HIGH (ref 5.7–26.3)

## 2021-12-17 NOTE — Progress Notes (Signed)
Location:  [facility] Room Number: NO/148/D Place of Service:  SNF (31) Kennan Detter S.,NP  CODE STATUS: DNR  Allergies  Allergen Reactions   Ace Inhibitors Other (See Comments) and Cough    Tongue swell , ie angioedema   Angiotensin Receptor Blockers     Angioedema with ACE-I   Penicillins Other (See Comments)    Unsure of reaction Has patient had a PCN reaction causing immediate rash, facial/tongue/throat swelling, SOB or lightheadedness with hypotension: Unknown Has patient had a PCN reaction causing severe rash involving mucus membranes or skin necrosis: Unknown Has patient had a PCN reaction that required hospitalization: No Has patient had a PCN reaction occurring within the last 10 years: Unknown If all of the above answers are "NO", then may proceed with Cephalosporin use.     Penicillin G     Chief Complaint  Patient presents with   Acute Visit    Follow up diabetes     HPI:  She has a long term history of diabetes. Her hgb a1c is 5.3. she is presently not taking any medications. Her AM readings are low in the 80's. There are no reports of symptoms of hypoglycemia present. No excessive hunger or thirst.   Past Medical History:  Diagnosis Date   Anemia     chronic macrocytic anemia   Anxiety    Chronic kidney disease    Chronic renal disease, stage 4, severely decreased glomerular filtration rate (GFR) between 15-29 mL/min/1.73 square meter ([place]) 60/63/0160   Complication of anesthesia    delirious after Breast Surgery   Dementia ([place])    mild   Depression    Diabetes mellitus with ESRD (end-stage renal disease) ([place])    type II   Dysphagia    Dyspnea    with activity   GERD (gastroesophageal reflux disease)    Glaucoma    Hyperlipidemia    Hypertension    Multiple myeloma (H[facility])    Pneumonia    Stage 1 infiltrating ductal carcinoma of right female breast ([place]) 08/21/2015   ER+ PR+ HER 2 neu + (3+) T1cN0     Past Surgical  History:  Procedure Laterality Date   A/V FISTULAGRAM Right 07/05/2020   Procedure: A/V Fistulagram;  Surgeon: Elam Dutch, MD;  Location: [facility];  Service: Cardiovascular;  Laterality: Right;   AV FISTULA PLACEMENT Left 11/22/2017   Procedure: ARTERIOVENOUS (AV) FISTULA CREATION LEFT ARM;  Surgeon: Elam Dutch, MD;  Location: [facility] OR;  Service: Vascular;  Laterality: Left;   AV FISTULA PLACEMENT Right 04/04/2020   Procedure: RIGHT ARM ARTERIOVENOUS FISTULA CREATION;  Surgeon: Rosetta Posner, MD;  Location: [facility];  Service: Vascular;  Laterality: Right;   AV FISTULA PLACEMENT Right 08/20/2020   Procedure: ARTERIOVENOUS (AV) FISTULA LIGATION RIGHT ARM;  Surgeon: Elam Dutch, MD;  Location: [facility] OR;  Service: Vascular;  Laterality: Right;   BIOPSY  08/07/2016   Procedure: BIOPSY;  Surgeon: Daneil Dolin, MD;  [facility];  Service: Endoscopy;;  gastric ulcer biopsy   COLONOSCOPY     ESOPHAGOGASTRODUODENOSCOPY N/A 08/07/2016   LA Grade A esophagitis s/p dilation, small hiatal hernia, multiple gastric ulcers and erosions, duodenal erosions s/p biopsy. Negative H.pylori    ESOPHAGOGASTRODUODENOSCOPY N/A 11/27/2016   normal esophagus, previously noted gastric ulcers completely healed, normal duodenum.    ESOPHAGOGASTRODUODENOSCOPY (EGD) WITH PROPOFOL N/A 07/23/2020   Procedure: ESOPHAGOGASTRODUODENOSCOPY (EGD) WITH PROPOFOL;  Surgeon: Rogene Houston, MD;  [facility];  Service: Endoscopy;  Laterality: N/A;   FISTULA SUPERFICIALIZATION Left 02/14/2018   Procedure: FISTULA SUPERFICIALIZATION LEFT ARM;  Surgeon: Angelia Mould, MD;  Location: [facility] OR;  Service: Vascular;  Laterality: Left;   FRACTURE SURGERY Right    ankle   HOT HEMOSTASIS  07/23/2020   Procedure: HOT HEMOSTASIS (ARGON PLASMA COAGULATION/BICAP);  Surgeon: Rogene Houston, MD;  [facility];  Service: Endoscopy;;   INTRAMEDULLARY (IM) NAIL INTERTROCHANTERIC Right  07/12/2020   Procedure: INTRAMEDULLARY (IM) NAIL INTERTROCHANTRIC;  Surgeon: Mordecai Rasmussen, MD;  Location: [facility];  Service: Orthopedics;  Laterality: Right;   MALONEY DILATION N/A 08/07/2016   Procedure: Venia Minks DILATION;  Surgeon: Daneil Dolin, MD;  [facility];  Service: Endoscopy;  Laterality: N/A;   MASTECTOMY, PARTIAL Right    PERIPHERAL VASCULAR BALLOON ANGIOPLASTY Left 07/13/2019   Procedure: PERIPHERAL VASCULAR BALLOON ANGIOPLASTY;  Surgeon: Marty Heck, MD;  Location: [facility];  Service: Cardiovascular;  Laterality: Left;  arm fistulogram   PERIPHERAL VASCULAR BALLOON ANGIOPLASTY Right 05/22/2020   Procedure: PERIPHERAL VASCULAR BALLOON ANGIOPLASTY;  Surgeon: Cherre Robins, MD;  Location: [facility];  Service: Cardiovascular;  Laterality: Right;  arm fistula   PERIPHERAL VASCULAR BALLOON ANGIOPLASTY Right 07/05/2020   Procedure: PERIPHERAL VASCULAR BALLOON ANGIOPLASTY;  Surgeon: Elam Dutch, MD;  Location: [facility];  Service: Cardiovascular;  Laterality: Right;  arm fistula   PORT-A-CATH REMOVAL Left 11/22/2017   Procedure: REMOVAL PORT-A-CATH LEFT CHEST;  Surgeon: Elam Dutch, MD;  Location: [facility];  Service: Vascular;  Laterality: Left;   RETINAL DETACHMENT SURGERY Right    SCLEROTHERAPY  07/23/2020   Procedure: SCLEROTHERAPY;  Surgeon: Rogene Houston, MD;  [facility];  Service: Endoscopy;;   UPPER EXTREMITY VENOGRAPHY N/A 08/22/2020   Procedure: LEFT UPPER & CENTRAL VENOGRAPHY;  Surgeon: Marty Heck, MD;  Location: [facility];  Service: Cardiovascular;  Laterality: N/A;    Social History   Socioeconomic History   Marital status: Single    Spouse name: Not on file   Number of children: Not on file   Years of education: Not on file   Highest education level: Not on file  Occupational History   Occupation: retired   Tobacco Use   Smoking status: Never   Smokeless tobacco: Never   Vaping Use   Vaping Use: Never used  Substance and Sexual Activity   Alcohol use: No    Alcohol/week: 0.0 standard drinks of alcohol   Drug use: No   Sexual activity: Never  Other Topics Concern   Not on file  Social History Narrative   Long term resident of SNF    Social Determinants of Health   Financial Resource Strain: [place]  (01/09/2020)   Overall Financial Resource Strain (CARDIA)    Difficulty of Paying Living Expenses: Not very hard  Food Insecurity: No Food Insecurity (01/09/2020)   Hunger Vital Sign    Worried About Running Out of Food in the Last Year: Never true    Ran Out of Food in the Last Year: Never true  Transportation Needs: No Transportation Needs (01/09/2020)   PRAPARE - Hydrologist (Medical): No    Lack of Transportation (Non-Medical): No  Physical Activity: Inactive (01/09/2020)   Exercise Vital Sign    Days of Exercise per Week: 0 days    Minutes of Exercise per Session: 0 min  Stress: No Stress Concern Present (01/09/2020)   [place]    Feeling of Stress : Not at all  Social Connections: Moderately Isolated (01/09/2020)   Social Connection and Isolation Panel [NHANES]    Frequency of Communication with Friends and Family: More than three times a week    Frequency of Social Gatherings with Friends and Family: Once a week    Attends Religious Services: More than 4 times per year    Active Member of [employer] or Organizations: No    Attends Archivist Meetings: Never    Marital Status: Never married  Intimate Partner Violence: Not At Risk (01/09/2020)   Humiliation, Afraid, Rape, and Kick questionnaire    Fear of Current or Ex-Partner: No    Emotionally Abused: No    Physically Abused: No    Sexually Abused: No   Family History  Problem Relation Age of Onset   Multiple myeloma Sister    Brain cancer Sister    Dementia Mother        died at 97    Stroke Mother    Heart failure Mother    Diabetes Mother    Heart disease Father    Prostate cancer Brother    Colon cancer Neg Hx       VITAL SIGNS BP (!) 140/80   Pulse 93   Temp 98.4 F (36.9 C)   Resp (!) 22   Ht _0  (1.6 m)   Wt 142 lb 9.6 oz (64.7 kg)   SpO2 98%   BMI 25.26 kg/m   Outpatient Encounter Medications as of 12/17/2021  Medication Sig   acetaminophen (TYLENOL) 325 MG tablet Take 650 mg by mouth every 8 (eight) hours.   acyclovir (ZOVIRAX) 200 MG capsule Take 200 mg by mouth in the morning. (0800)   Amino Acids-Protein Hydrolys (FEEDING SUPPLEMENT, PRO-STAT SUGAR FREE 64,) LIQD Take 30 mLs by mouth 3 (three) times daily with meals. (0800, 1200 & 1800)   anastrozole (ARIMIDEX) 1 MG tablet TAKE 1 TABLET BY MOUTH DAILY   Calcium Acetate 667 MG TABS Take 2 tablets by mouth 3 (three) times daily with meals. 1 tablet daily w/ snack   calcium-vitamin D (OSCAL WITH D) 500-200 MG-UNIT tablet Take 1 tablet by mouth in the morning. (0800)   docusate sodium (COLACE) 100 MG capsule Take 100 mg by mouth 2 (two) times daily.   Insulin Pen Needle (BD AUTOSHIELD DUO) 30G X 5 MM MISC by Does not apply route. 3/16"   melatonin 3 MG TABS tablet Take 6 mg by mouth at bedtime. (2000) For Sleep   multivitamin (RENA-VIT) TABS tablet Take 1 tablet by mouth at bedtime. (2100)   ondansetron (ZOFRAN) 4 MG tablet Take 1 tablet (4 mg total) by mouth every 6 (six) hours as needed for nausea.   pantoprazole (PROTONIX) 40 MG tablet Take 40 mg by mouth 2 (two) times daily.   sertraline (ZOLOFT) 25 MG tablet Take 25 mg by mouth daily. At 9 am along with 50 mg tablet for a total of 75 mg daily   sertraline (ZOLOFT) 50 MG tablet Take 50 mg by mouth daily. At 9 am along with 25 mg tablet for a total of 75 mg daily   sucralfate (CARAFATE) 1 GM/10ML suspension Take 1 g by mouth in the morning, at noon, in the evening, and at bedtime. (0800, 1200, 1700 & 2100)   Facility-Administered Encounter  Medications as of 12/17/2021  Medication   lanreotide acetate (SOMATULINE DEPOT) 120 MG/0.5ML injection   lanreotide acetate (SOMATULINE DEPOT) 120 MG/0.5ML injection   octreotide (SANDOSTATIN LAR) 30 MG IM injection   octreotide (SANDOSTATIN LAR) 30 MG IM injection     SIGNIFICANT DIAGNOSTIC EXAMS  PREVIOUS   10-01-21: chest x-ray: No evidence of active cardiopulmonary process.   10-01-21: abdominal ultrasound:  1. Cholelithiasis with no evidence of acute cholecystitis. 2. Increased echogenicity of the liver parenchyma, findings suggestive of hepatic steatosis.  10-01-21: ct of chest abdomen and pelvis  No aspiration pneumonia. Mild focal scarring at the medial left lung base.   Coronary artery calcification. Aortic atherosclerotic calcification. Aortic Atherosclerosis  Probably acute superior endplate fractures at E33, L1 and L2. No retropulsed bone.   Renal atrophy. Multiple renal cysts. Indeterminate 2.7 cm lesion at the lower pole the right kidney could be a complicated cyst or a mass. This could be evaluated with renal MRI. 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 Nonobstructing 5 mm stone in the lower pole of the left kidney.   1 x 2 cm cystic abnormality in the ventral head of the pancreas. Best practice recommendation is for reimaging of this every 6 months for 2 years and then every 1 year for 2 years and then every 2 years for 6 years. Follow-up can be discontinued if stable for 10 years. This recommendation could be tailored to this patient's clinical status and life expectancy.  Paget's disease of the right hemipelvis. Diverticulosis without evidence of diverticulitis. Small hiatal hernia. Multiple calcified leiomyomas the uterus.  10-24-21: DEXA: t score -2.957  NO NEW EXAMS      LABS REVIEWED PREVIOUS   12-17-20: wbc 5.2; hgb 10.5; hct 33.8; mcv 98.8 plt 256; glucose 136; bun 43; creat 6.19; k+ 3.6; na++ 137; ca 8.9; GFR 6 liver normal albumin 3.9  03-06-21: wbc  4.7; hgb 12.3; hct 38.2; mcv 101.6 plt 221; glucose 205; bun 51; creat 7.06; k+ 4.5; na++ 133; ca 9.1; GFR 5; liver normal albumin 3.8  03-20-21: wbc 5.4; hgb 12.1; hct 37.7; mcv 97.2 plt 254; glucose 111; bun 47; creat 7.34; k+ 4.6; na++ 131; ca 9.6 GFR 5; liver normal albumin 3.9; mag 2.2 03-27-21: hgb a1c 6.1   04-29-21; wbc 5.4; hgb 11.0; hct 34.6;mcv 100.9 plt 179; glucose 82; bun 44; creat 7.04; k+ 4.6; na++ 139; ca 8.7; GFR 5 d-dimer: 1.25; CRP 4.0   05-27-21: wbc 6.0; hgb 10.6; hct 33.5; mcv 101.2 plt 261; glucose 147; bun 45; creat 6.86; k+ 4.5; na++ 138; ca 8.3; GFR 6; protein 6.9; albumin 3.8  06-12-21: wbc 6.5; hgb 8.8; hct 27.7; mcv 101.5 plt 229; glucose 153; bun 40; creat 6.53; k+ 4.3; na++ 136; ca 9.5; GFR 6 protein 6.5; albumin 3.4 mag 2.2  07-07-21: chol 171; ldl 89; trig 265; hdl 29  07-08-21: wbc 15.7; hgb 9.8; hct 32.8; mcv 104.8; plt 226; glucose 164; bun 51; creat 7.74; k+ 4.4; na++ 138; ca 9.7; gfr 5 protein 7.5; albumin 3.9 mag 2.1; chol 171; ldl 89; trig 265; hdl 29; hgb a1c 6.1 08-12-21: wbc 6.3; hgb 10.0; hct 31.9; mcv 103.9 plt 215; glucose 197; bun 40;creat 6.25; k+ 4.2; na++138; ca 9.3; gfr 6; protein 6.7; albumin 3.7; iron 93; tibc 142; ferritin 312 09-30-21: wbc 6.6; hgb 9.6; hct 31.1; mcv 101.3 plt 232; glucose 222; bun 40; creat 6.78; k+ 4.0; na++ 136; ca 9.2; gfr 6; protein 6.8 albumin 3.7  10-01-21: wbc 13.0; hgb 9.1; hct 28.8;  mcv 99.3 plt 251; glucose 198; bun 59; creat 9.01; k+ 3.7; na++ 135; ca 9.6; gfr 4; ast 212; alt 173; protein 6.7; albumin 3.6  10-03-21: wbc 7.2; hgb 8.6; hct 27.5; mcv 99.6 plt 179 glucose 105; bun 34; creat 7.04; k+ 3.6; na++ 135; ca 8.8; gfr 5 ast 45; alt 99; protein 6.5; albumin 3.5  10-10-21: hgb a1c 5.3  11-25-21: wbc 4.9; hgb 9.4; hct 30.7; mcv 100.7 plt 237; glucose 171; bun 27; creat 6.88; k+ 4.4; na++ 137; ca 9.0; gfr 6; protein 6.4 albumin 3.5   NO NEW LABS   Review of Systems  Constitutional:  Negative for malaise/fatigue.  Respiratory:   Negative for cough and shortness of breath.   Cardiovascular:  Negative for chest pain, palpitations and leg swelling.  Gastrointestinal:  Negative for abdominal pain, constipation and heartburn.  Musculoskeletal:  Negative for back pain, joint pain and myalgias.  Skin: Negative.   Neurological:  Negative for dizziness.  Psychiatric/Behavioral:  The patient is not nervous/anxious.    Physical Exam Constitutional:      General: She is not in acute distress.    Appearance: She is well-developed. She is not diaphoretic.  Neck:     Thyroid: No thyromegaly.  Cardiovascular:     Rate and Rhythm: Normal rate and regular rhythm.     Pulses: Normal pulses.     Heart sounds: Normal heart sounds.  Pulmonary:     Effort: Pulmonary effort is normal. No respiratory distress.     Breath sounds: Normal breath sounds.  Abdominal:     General: Bowel sounds are normal. There is no distension.     Palpations: Abdomen is soft.     Tenderness: There is no abdominal tenderness.  Musculoskeletal:        General: Normal range of motion.     Cervical back: Neck supple.     Right lower leg: No edema.     Left lower leg: No edema.  Lymphadenopathy:     Cervical: No cervical adenopathy.  Skin:    General: Skin is warm and dry.     Comments: Dialysis access chest wall   Neurological:     Mental Status: She is alert. Mental status is at baseline.  Psychiatric:        Mood and Affect: Mood normal.     ASSESSMENT/ PLAN:  TODAY  Type 2 diabetes mellitus with hypertension and end stage renal disease on hemodialysis: hgb a1c 5.3. she is presently not on medications; will have staff give her an HS snack to support her glucose readings.    Ok Edwards NP [facility]  call [phone]

## 2021-12-19 ENCOUNTER — Encounter: Payer: Self-pay | Admitting: Adult Health

## 2021-12-19 ENCOUNTER — Non-Acute Institutional Stay (SKILLED_NURSING_FACILITY): Payer: Medicare PPO | Admitting: Adult Health

## 2021-12-19 DIAGNOSIS — N2581 Secondary hyperparathyroidism of renal origin: Secondary | ICD-10-CM | POA: Diagnosis not present

## 2021-12-19 DIAGNOSIS — Z992 Dependence on renal dialysis: Secondary | ICD-10-CM | POA: Diagnosis not present

## 2021-12-19 DIAGNOSIS — N186 End stage renal disease: Secondary | ICD-10-CM | POA: Diagnosis not present

## 2021-12-19 DIAGNOSIS — Z0000 Encounter for general adult medical examination without abnormal findings: Secondary | ICD-10-CM

## 2021-12-19 LAB — PROTEIN ELECTROPHORESIS, SERUM
A/G Ratio: 1.5 (ref 0.7–1.7)
Albumin ELP: 3.9 g/dL (ref 2.9–4.4)
Alpha-1-Globulin: 0.2 g/dL (ref 0.0–0.4)
Alpha-2-Globulin: 0.7 g/dL (ref 0.4–1.0)
Beta Globulin: 1 g/dL (ref 0.7–1.3)
Gamma Globulin: 0.6 g/dL (ref 0.4–1.8)
Globulin, Total: 2.6 g/dL (ref 2.2–3.9)
Total Protein ELP: 6.5 g/dL (ref 6.0–8.5)

## 2021-12-19 NOTE — Progress Notes (Signed)
Subjective:   Carly Wood is a 86 y.o. female who presents for Medicare Annual (Subsequent) preventive examination.  Review of Systems    Review of Systems  Constitutional:  Negative for malaise/fatigue.  Respiratory:  Negative for cough and shortness of breath.   Cardiovascular:  Negative for chest pain, palpitations and leg swelling.  Gastrointestinal:  Negative for abdominal pain, constipation and heartburn.  Musculoskeletal:  Negative for back pain, joint pain and myalgias.  Skin: Negative.   Neurological:  Negative for dizziness.  Psychiatric/Behavioral:  The patient is not nervous/anxious.     Cardiac Risk Factors include: advanced age (>79mn, >>43women);diabetes mellitus;dyslipidemia;hypertension;sedentary lifestyle     Objective:    Today's Vitals   12/19/21 0838  BP: (!) 140/80  Pulse: 93  Resp: (!) 22  Temp: 98.4 F (36.9 C)  TempSrc: Temporal  SpO2: 98%  Weight: 142 lb 9.6 oz (64.7 kg)  Height: '5\' 3"'  (1.6 m)   Body mass index is 25.26 kg/m.     12/19/2021    8:46 AM 12/17/2021   11:58 AM 12/10/2021    1:47 PM 10/28/2021    2:20 PM 10/14/2021    4:05 PM 10/06/2021    9:24 AM 10/01/2021    9:45 AM  Advanced Directives  Does Patient Have a Medical Advance Directive? Yes Yes Yes Yes Yes Yes Yes  Type of [place] D[place] (pink MOST or yellow form) Out of facility DNR (pink MOST or yellow form) Out of facility DNR (pink MOST or yellow form) H[place] will Out of facility DNR (pink MOST or yellow form);Healthcare Power of AHarley-Davidsonof facility DNR (pink MOST or yellow form);Healthcare Power of AHarley-Davidsonof facility DNR (pink MOST or yellow form)  Does patient want to make changes to medical advance directive? No - Patient declined No - Patient declined No - Patient declined No - Patient declined No - Patient declined No - Patient declined   Copy of HLaddoniain  Chart? Yes - validated most recent copy scanned in chart (See row information)   No - copy requested No - copy requested Yes - validated most recent copy scanned in chart (See row information)   Would patient like information on creating a medical advance directive?    No - Patient declined No - Patient declined No - Patient declined   Pre-existing out of facility DNR order (yellow form or pink MOST form)  Yellow form placed in chart (order not valid for inpatient use) Yellow form placed in chart (order not valid for inpatient use)   Yellow form placed in chart (order not valid for inpatient use)     Current Medications (verified) Outpatient Encounter Medications as of 12/19/2021  Medication Sig   acetaminophen (TYLENOL) 325 MG tablet Take 650 mg by mouth every 8 (eight) hours.   acyclovir (ZOVIRAX) 200 MG capsule Take 200 mg by mouth in the morning. (0800)   Amino Acids-Protein Hydrolys (FEEDING SUPPLEMENT, PRO-STAT SUGAR FREE 64,) LIQD Take 30 mLs by mouth 3 (three) times daily with meals. (0800, 1200 & 1800)   anastrozole (ARIMIDEX) 1 MG tablet TAKE 1 TABLET BY MOUTH DAILY   Calcium Acetate 667 MG TABS Take 2 tablets by mouth 3 (three) times daily with meals. 1 tablet daily w/ snack   calcium-vitamin D (OSCAL WITH D) 500-200 MG-UNIT tablet Take 1 tablet by mouth in the morning. (0800)   docusate sodium (COLACE)  100 MG capsule Take 100 mg by mouth 2 (two) times daily.   Insulin Pen Needle (BD AUTOSHIELD DUO) 30G X 5 MM MISC by Does not apply route. 3/16"   melatonin 3 MG TABS tablet Take 6 mg by mouth at bedtime. (2000) For Sleep   multivitamin (RENA-VIT) TABS tablet Take 1 tablet by mouth at bedtime. (2100)   ondansetron (ZOFRAN) 4 MG tablet Take 1 tablet (4 mg total) by mouth every 6 (six) hours as needed for nausea.   pantoprazole (PROTONIX) 40 MG tablet Take 40 mg by mouth 2 (two) times daily.   sertraline (ZOLOFT) 25 MG tablet Take 25 mg by mouth daily. At 9 am along with 50 mg tablet for  a total of 75 mg daily   sertraline (ZOLOFT) 50 MG tablet Take 50 mg by mouth daily. At 9 am along with 25 mg tablet for a total of 75 mg daily   sucralfate (CARAFATE) 1 GM/10ML suspension Take 1 g by mouth in the morning, at noon, in the evening, and at bedtime. (0800, 1200, 1700 & 2100)   Facility-Administered Encounter Medications as of 12/19/2021  Medication   lanreotide acetate (SOMATULINE DEPOT) 120 MG/0.5ML injection   lanreotide acetate (SOMATULINE DEPOT) 120 MG/0.5ML injection   octreotide (SANDOSTATIN LAR) 30 MG IM injection   octreotide (SANDOSTATIN LAR) 30 MG IM injection    Allergies (verified) Ace inhibitors, Angiotensin receptor blockers, Penicillins, and Penicillin g   History: Past Medical History:  Diagnosis Date   Anemia     chronic macrocytic anemia   Anxiety    Chronic kidney disease    Chronic renal disease, stage 4, severely decreased glomerular filtration rate (GFR) between 15-29 mL/min/1.73 square meter (HCC) 08/81/1031   Complication of anesthesia    delirious after Breast Surgery   Dementia (HCC)    mild   Depression    Diabetes mellitus with ESRD (end-stage renal disease) ([place])    type II   Dysphagia    Dyspnea    with activity   GERD (gastroesophageal reflux disease)    Glaucoma    Hyperlipidemia    Hypertension    Multiple myeloma ([place])    Pneumonia    Stage 1 infiltrating ductal carcinoma of right female breast ([place]) 08/21/2015   ER+ PR+ HER 2 neu + (3+) T1cN0    Past Surgical History:  Procedure Laterality Date   A/V FISTULAGRAM Right 07/05/2020   Procedure: A/V Fistulagram;  Surgeon: Elam Dutch, MD;  Location: [facility];  Service: Cardiovascular;  Laterality: Right;   AV FISTULA PLACEMENT Left 11/22/2017   Procedure: ARTERIOVENOUS (AV) FISTULA CREATION LEFT ARM;  Surgeon: Elam Dutch, MD;  [facility];  Service: Vascular;  Laterality: Left;   AV FISTULA PLACEMENT Right 04/04/2020   Procedure: RIGHT ARM  ARTERIOVENOUS FISTULA CREATION;  Surgeon: Rosetta Posner, MD;  [facility];  Service: Vascular;  Laterality: Right;   AV FISTULA PLACEMENT Right 08/20/2020   Procedure: ARTERIOVENOUS (AV) FISTULA LIGATION RIGHT ARM;  Surgeon: Elam Dutch, MD;  Location: [facility] OR;  Service: Vascular;  Laterality: Right;   BIOPSY  08/07/2016   Procedure: BIOPSY;  Surgeon: Daneil Dolin, MD;  [facility];  Service: Endoscopy;;  gastric ulcer biopsy   COLONOSCOPY     ESOPHAGOGASTRODUODENOSCOPY N/A 08/07/2016   LA Grade A esophagitis s/p dilation, small hiatal hernia, multiple gastric ulcers and erosions, duodenal erosions s/p biopsy. Negative H.pylori    ESOPHAGOGASTRODUODENOSCOPY N/A 11/27/2016  normal esophagus, previously noted gastric ulcers completely healed, normal duodenum.    ESOPHAGOGASTRODUODENOSCOPY (EGD) WITH PROPOFOL N/A 07/23/2020   Procedure: ESOPHAGOGASTRODUODENOSCOPY (EGD) WITH PROPOFOL;  Surgeon: Rogene Houston, MD;  [facility];  Service: Endoscopy;  Laterality: N/A;   FISTULA SUPERFICIALIZATION Left 02/14/2018   Procedure: FISTULA SUPERFICIALIZATION LEFT ARM;  Surgeon: Angelia Mould, MD;  Location[facility] OR;  Service: Vascular;  Laterality: Left;   FRACTURE SURGERY Right    ankle   HOT HEMOSTASIS  07/23/2020   Procedure: HOT HEMOSTASIS (ARGON PLASMA COAGULATION/BICAP);  Surgeon: Rogene Houston, MD;  [facility];  Service: Endoscopy;;   INTRAMEDULLARY (IM) NAIL INTERTROCHANTERIC Right 07/12/2020   Procedure: INTRAMEDULLARY (IM) NAIL INTERTROCHANTRIC;  Surgeon: Mordecai Rasmussen, MD;  Location[facility];  Service: Orthopedics;  Laterality: Right;   MALONEY DILATION N/A 08/07/2016   Procedure: Venia Minks DILATION;  Surgeon: Daneil Dolin, MD;  [facility];  Service: Endoscopy;  Laterality: N/A;   MASTECTOMY, PARTIAL Right    PERIPHERAL VASCULAR BALLOON ANGIOPLASTY Left 07/13/2019   Procedure: PERIPHERAL VASCULAR BALLOON ANGIOPLASTY;   Surgeon: Marty Heck, MD;  Location: [facility];  Service: Cardiovascular;  Laterality: Left;  arm fistulogram   PERIPHERAL VASCULAR BALLOON ANGIOPLASTY Right 05/22/2020   Procedure: PERIPHERAL VASCULAR BALLOON ANGIOPLASTY;  Surgeon: Cherre Robins, MD;  Location: [facility];  Service: Cardiovascular;  Laterality: Right;  arm fistula   PERIPHERAL VASCULAR BALLOON ANGIOPLASTY Right 07/05/2020   Procedure: PERIPHERAL VASCULAR BALLOON ANGIOPLASTY;  Surgeon: Elam Dutch, MD;  Location: [place];  Service: Cardiovascular;  Laterality: Right;  arm fistula   PORT-A-CATH REMOVAL Left 11/22/2017   Procedure: REMOVAL PORT-A-CATH LEFT CHEST;  Surgeon: Elam Dutch, MD;  Location: [facility];  Service: Vascular;  Laterality: Left;   RETINAL DETACHMENT SURGERY Right    SCLEROTHERAPY  07/23/2020   Procedure: SCLEROTHERAPY;  Surgeon: Rogene Houston, MD;  [facility];  Service: Endoscopy;;   UPPER EXTREMITY VENOGRAPHY N/A 08/22/2020   Procedure: LEFT UPPER & CENTRAL VENOGRAPHY;  Surgeon: Marty Heck, MD;  Location: [facility];  Service: Cardiovascular;  Laterality: N/A;   Family History  Problem Relation Age of Onset   Multiple myeloma Sister    Brain cancer Sister    Dementia Mother        died at 52   Stroke Mother    Heart failure Mother    Diabetes Mother    Heart disease Father    Prostate cancer Brother    Colon cancer Neg Hx    Social History   Socioeconomic History   Marital status: Single    Spouse name: Not on file   Number of children: Not on file   Years of education: Not on file   Highest education level: Not on file  Occupational History   Occupation: retired   Tobacco Use   Smoking status: Never   Smokeless tobacco: Never  Vaping Use   Vaping Use: Never used  Substance and Sexual Activity   Alcohol use: No    Alcohol/week: 0.0 standard drinks of alcohol   Drug use: No   Sexual activity: Never  Other  Topics Concern   Not on file  Social History Narrative   Long term resident of SNF    Social Determinants of Health   Financial Resource Strain: Low Risk  (01/09/2020)   Overall Financial Resource Strain (CARDIA)    Difficulty of Paying  Living Expenses: Not very hard  Food Insecurity: No Food Insecurity (01/09/2020)   Hunger Vital Sign    Worried About Running Out of Food in the Last Year: Never true    Ran Out of Food in the Last Year: Never true  Transportation Needs: No Transportation Needs (01/09/2020)   PRAPARE - Hydrologist (Medical): No    Lack of Transportation (Non-Medical): No  Physical Activity: Inactive (01/09/2020)   Exercise Vital Sign    Days of Exercise per Week: 0 days    Minutes of Exercise per Session: 0 min  Stress: No Stress Concern Present (01/09/2020)   [place]    Feeling of Stress : Not at all  Social Connections: Moderately Isolated (01/09/2020)   Social Connection and Isolation Panel [NHANES]    Frequency of Communication with Friends and Family: More than three times a week    Frequency of Social Gatherings with Friends and Family: Once a week    Attends Religious Services: More than 4 times per year    Active Member of [employer] or Organizations: No    Attends Music therapist: Never    Marital Status: Never married    Tobacco Counseling Counseling given: Not Answered   Clinical Intake:  Pre-visit preparation completed: Yes  Pain : No/denies pain     BMI - recorded: 25.26 Nutritional Status: BMI 25 -29 Overweight Nutritional Risks: Unintentional weight loss, Failure to thrive Diabetes: Yes CBG done?: Yes CBG resulted in Enter/ Edit results?: Yes Did pt. bring in CBG monitor from home?: No  How often do you need to have someone help you when you read instructions, pamphlets, or other written materials from your doctor or pharmacy?: 5 -  Always  Diabetic?yes   Interpreter Needed?: No      Activities of Daily Living    12/19/2021   12:05 PM  In your present state of health, do you have any difficulty performing the following activities:  Hearing? 0  Vision? 0  Difficulty concentrating or making decisions? 1  Walking or climbing stairs? 1  Dressing or bathing? 1  Doing errands, shopping? 1  Preparing Food and eating ? Y  Using the Toilet? Y  In the past six months, have you accidently leaked urine? Y  Do you have problems with loss of bowel control? Y  Managing your Medications? Y  Managing your Finances? Y  Housekeeping or managing your Housekeeping? Y    Patient Care Team: Gerlene Fee, NP as PCP - General (Geriatric Medicine) Gala Romney Cristopher Estimable, MD as Consulting Physician (Gastroenterology) Derek Jack, MD as Medical Oncologist (Medical Oncology) Fran Lowes, MD (Inactive) as Consulting Physician (Nephrology) [facility] [place] [facility]  Indicate any recent Medical Services you may have received from other than [facility] providers in the past year (date may be approximate).     Assessment:   This is a routine wellness examination for Rhonda.  Hearing/Vision screen No results found.  Dietary issues and exercise activities discussed: Current Exercise Habits: The patient does not participate in regular exercise at present, Exercise limited by: None identified   Goals Addressed             This Visit's Progress    DIET - INCREASE WATER INTAKE   On track    Follow up with Provider as scheduled   On track    General - Client will not be readmitted within 30  days ([facility])   On track      Depression Screen    12/19/2021   12:04 PM 03/27/2021   12:36 PM 03/07/2021   12:43 PM 12/17/2020   10:08 AM 01/09/2020    2:45 PM 11/08/2019   11:02 AM  PHQ 2/9 Scores  PHQ - 2 Score 0 0 0 0 0 0  PHQ- 9 Score  0        Fall Risk    12/19/2021   12:01 PM 12/19/2021     8:44 AM 12/17/2021   11:58 AM 03/27/2021   12:36 PM 12/17/2020   10:08 AM  Fall Risk   Falls in the past year? 1 0 0 1 0  Number falls in past yr: 1 0 0 1 0  Injury with Fall? 0 0 0 1 0  Risk for fall due to : History of fall(s);Impaired balance/gait;Impaired mobility No Fall Risks No Fall Risks History of fall(s);Impaired balance/gait;Impaired mobility   Follow up Falls evaluation completed Falls evaluation completed Falls evaluation completed Falls evaluation completed     FALL RISK PREVENTION PERTAINING TO THE HOME:  Any stairs in or around the home? Yes  If so, are there any without handrails? No  Home free of loose throw rugs in walkways, pet beds, electrical cords, etc? Yes  Adequate lighting in your home to reduce risk of falls? Yes   ASSISTIVE DEVICES UTILIZED TO PREVENT FALLS:  Life alert? No  Use of a cane, walker or w/c? Yes  Grab bars in the bathroom? Yes  Shower chair or bench in shower? Yes  Elevated toilet seat or a handicapped toilet? Yes   TIMED UP AND GO:  Was the test performed? No .  Length of time to ambulate nonambulatory    Cognitive Function:    12/19/2021   12:05 PM 12/17/2020   10:12 AM  MMSE - Mini Mental State Exam  Not completed: Unable to complete Unable to complete        12/19/2021   12:05 PM 11/08/2019   11:04 AM  6CIT Screen  What Year? 0 points 0 points  What month? 3 points 0 points  What time? 3 points 3 points  Count back from 20 2 points 2 points  Months in reverse 4 points 2 points  Repeat phrase 6 points 2 points  Total Score 18 points 9 points    Immunizations Immunization History  Administered Date(s) Administered   Fluad Quad(high Dose 65+) 11/22/2018, 12/09/2021   Hepatitis B, adult 02/28/2020   Influenza,inj,Quad PF,6+ Mos 11/22/2015, 12/11/2020   Influenza,inj,quad, With Preservative 12/20/2014, 12/31/2016   Influenza-Unspecified 11/22/2018, 12/15/2019, 12/09/2021   Moderna Covid-19 Vaccine Bivalent Booster  70yr & up 12/31/2020   Moderna SARS-COV2 Booster Vaccination 06/19/2020, 10/23/2020, 07/29/2021   Moderna Sars-Covid-2 Vaccination 05/11/2019, 06/05/2019, 01/11/2020   PNEUMOCOCCAL CONJUGATE-20 12/13/2020   PPD Test 05/03/2019, 06/23/2019, 08/16/2019, 06/27/2020, 07/13/2020   Pneumococcal Conjugate-13 12/20/2014   Pneumococcal Polysaccharide-23 12/06/2015   Pneumococcal-Unspecified 12/20/2014   Tdap 01/16/2021   Zoster Recombinat (Shingrix) 11/05/2009, 04/01/2021    TDAP status: Up to date  Flu Vaccine status: Up to date  Pneumococcal vaccine status: Up to date  Covid-19 vaccine status: Completed vaccines  Qualifies for Shingles Vaccine? Yes   Zostavax completed Yes   Shingrix Completed?: Yes  Screening Tests Health Maintenance  Topic Date Due   COVID-19 Vaccine (5 - Moderna risk series) 09/23/2021   FOOT EXAM  10/07/2021   OPHTHALMOLOGY EXAM  02/12/2022   HEMOGLOBIN A1C  04/12/2022   TETANUS/TDAP  01/17/2031   Pneumonia Vaccine 61+ Years old  Completed   INFLUENZA VACCINE  Completed   DEXA SCAN  Completed   Zoster Vaccines- Shingrix  Completed   HPV VACCINES  Aged Out    Health Maintenance  Health Maintenance Due  Topic Date Due   COVID-19 Vaccine (5 - Moderna risk series) 09/23/2021   FOOT EXAM  10/07/2021    Colorectal cancer screening: No longer required.   Mammogram status: No longer required due to age.  Bone Density status: Completed 10-24-21. Results reflect: Bone density results: OSTEOPOROSIS. Repeat every 2 years.  Lung Cancer Screening: (Low Dose CT Chest recommended if Age 75-80 years, 30 pack-year currently smoking OR have quit w/in 15years.) does not qualify.   Lung Cancer Screening Referral: n/a  Additional Screening:  Hepatitis C Screening: does not qualify;  Vision Screening: Recommended annual ophthalmology exams for early detection of glaucoma and other disorders of the eye. Is the patient up to date with their annual eye exam?  Yes   Who is the provider or what is the name of the office in which the patient attends annual eye exams?  If pt is not established with a provider, would they like to be referred to a provider to establish care? No .   Dental Screening: Recommended annual dental exams for proper oral hygiene  Community Resource Referral / Chronic Care Management: CRR required this visit?  No   CCM required this visit?  No      Plan:     I have personally reviewed and noted the following in the patient's chart:   Medical and social history Use of alcohol, tobacco or illicit drugs  Current medications and supplements including opioid prescriptions. Patient is not currently taking opioid prescriptions. Functional ability and status Nutritional status Physical activity Advanced directives List of other physicians Hospitalizations, surgeries, and ER visits in previous 12 months Vitals Screenings to include cognitive, depression, and falls Referrals and appointments  In addition, I have reviewed and discussed with patient certain preventive protocols, quality metrics, and best practice recommendations. A written personalized care plan for preventive services as well as general preventive health recommendations were provided to patient.     Gerlene Fee, NP   12/19/2021   Nurse Notes: the exam was performed at facility by myself.

## 2021-12-22 DIAGNOSIS — N2581 Secondary hyperparathyroidism of renal origin: Secondary | ICD-10-CM | POA: Diagnosis not present

## 2021-12-22 DIAGNOSIS — N186 End stage renal disease: Secondary | ICD-10-CM | POA: Diagnosis not present

## 2021-12-22 DIAGNOSIS — Z992 Dependence on renal dialysis: Secondary | ICD-10-CM | POA: Diagnosis not present

## 2021-12-23 ENCOUNTER — Inpatient Hospital Stay: Payer: Medicare PPO

## 2021-12-23 ENCOUNTER — Inpatient Hospital Stay (HOSPITAL_BASED_OUTPATIENT_CLINIC_OR_DEPARTMENT_OTHER): Payer: Medicare PPO | Admitting: Hematology

## 2021-12-23 VITALS — BP 131/77 | HR 102 | Temp 98.7°F | Resp 19

## 2021-12-23 DIAGNOSIS — E1122 Type 2 diabetes mellitus with diabetic chronic kidney disease: Secondary | ICD-10-CM | POA: Diagnosis not present

## 2021-12-23 DIAGNOSIS — C9000 Multiple myeloma not having achieved remission: Secondary | ICD-10-CM | POA: Diagnosis not present

## 2021-12-23 DIAGNOSIS — D649 Anemia, unspecified: Secondary | ICD-10-CM | POA: Diagnosis not present

## 2021-12-23 DIAGNOSIS — N186 End stage renal disease: Secondary | ICD-10-CM | POA: Diagnosis not present

## 2021-12-23 DIAGNOSIS — Z170 Estrogen receptor positive status [ER+]: Secondary | ICD-10-CM | POA: Diagnosis not present

## 2021-12-23 DIAGNOSIS — C50411 Malignant neoplasm of upper-outer quadrant of right female breast: Secondary | ICD-10-CM | POA: Diagnosis not present

## 2021-12-23 DIAGNOSIS — Z79811 Long term (current) use of aromatase inhibitors: Secondary | ICD-10-CM | POA: Diagnosis not present

## 2021-12-23 DIAGNOSIS — Z5112 Encounter for antineoplastic immunotherapy: Secondary | ICD-10-CM | POA: Diagnosis not present

## 2021-12-23 DIAGNOSIS — Z9011 Acquired absence of right breast and nipple: Secondary | ICD-10-CM | POA: Diagnosis not present

## 2021-12-23 MED ORDER — PROCHLORPERAZINE MALEATE 10 MG PO TABS
10.0000 mg | ORAL_TABLET | Freq: Once | ORAL | Status: AC
  Administered 2021-12-23: 10 mg via ORAL
  Filled 2021-12-23: qty 1

## 2021-12-23 MED ORDER — BORTEZOMIB CHEMO SQ INJECTION 3.5 MG (2.5MG/ML)
1.2000 mg/m2 | Freq: Once | INTRAMUSCULAR | Status: AC
  Administered 2021-12-23: 2 mg via SUBCUTANEOUS
  Filled 2021-12-23: qty 0.8

## 2021-12-23 NOTE — Progress Notes (Signed)
Patient has been examined by Dr. Katragadda, and vital signs and labs have been reviewed. ANC, Creatinine, LFTs, hemoglobin, and platelets are within treatment parameters per M.D. - pt may proceed with treatment.  Primary RN and pharmacy notified.  

## 2021-12-23 NOTE — Progress Notes (Signed)
[place], [place] [address]   CLINIC:  Medical Oncology/Hematology  PCP:  Gerlene Fee, NP [address] [place] [address] [phone]   REASON FOR VISIT:  Follow-up for plasma cell myeloma and anemia  PRIOR THERAPY: none  NGS Results: not done  CURRENT THERAPY: Velcade & Decadron 3/4 weeks  BRIEF ONCOLOGIC HISTORY:  Oncology History  Stage 1 infiltrating ductal carcinoma of right female breast ([place])  09/12/2014 Mammogram   Mass in upper outer R breast, middle third depth appears slightly larger than it was on prior exam with more irreg spiculated margins   10/02/2014 Pathology Results   biopsy with invasive ductal carcinoma high grade 1.1 cm, dcis solid type. additional R breast tissue, excision, invasive ductal high grade 0.9 cm    10/24/2014 Pathology Results   no residual invasive carcinoma, DCIS, focal, atypical ductal hyperplasia, 0/7 LN positive for metastatic carcinoma ER > 90%, PR 30%, HER 2 2+   10/24/2014 Cancer Staging   T1cN0M0   10/24/2014 Surgery   R mastectomy, T1c, N0   12/28/2014 - 11/22/2015 Chemotherapy   Taxol/herceptin weekly X 12, Herceptin every 21 days, last due in September 2017   03/11/2015 -  Anti-estrogen oral therapy   Arimidex 1 mg daily   09/12/2015 Imaging   MUGA- The left ventricular ejection fraction equals 68%.   06/08/2016 Treatment Plan Change   Started Nerlynx   Multiple myeloma ([place])  10/04/2018 Initial Diagnosis   Multiple myeloma ([place])   10/11/2018 - 10/28/2021 Chemotherapy   Patient is on Treatment Plan : MYELOMA NON-TRANSPLANT CANDIDATES VRd weekly d21d      11/25/2021 -  Chemotherapy   Patient is on Treatment Plan : MYELOMA MAINTENANCE Bortezomib SQ q14d       CANCER STAGING:  Cancer Staging  Stage 1 infiltrating ductal carcinoma of right female breast [facility] Staging form: Breast, AJCC 7th Edition - Clinical stage from 08/30/2015: Stage IA (T1c, N0, M0) - Signed by  Baird Cancer, PA-C on 08/30/2015   INTERVAL HISTORY:  Carly Wood, a 86 y.o. female, seen for follow-up of multiple myeloma and maintenance Velcade and toxicity assessment.  Denies any tingling or numbness in extremities.  Denies any recent infections.  Energy levels are 50%.  Reports dialysis has been going okay.  REVIEW OF SYSTEMS:  Review of Systems  Constitutional:  Negative for appetite change and fatigue.  HENT:   Positive for trouble swallowing.   Respiratory:  Negative for cough.   Gastrointestinal:  Positive for vomiting.  All other systems reviewed and are negative.   PAST MEDICAL/SURGICAL HISTORY:  Past Medical History:  Diagnosis Date   Anemia     chronic macrocytic anemia   Anxiety    Chronic kidney disease    Chronic renal disease, stage 4, severely decreased glomerular filtration rate (GFR) between 15-29 mL/min/1.73 square meter ([place]) 82/99/3716   Complication of anesthesia    delirious after Breast Surgery   Dementia ([place])    mild   Depression    Diabetes mellitus with ESRD (end-stage renal disease) ([place])    type II   Dysphagia    Dyspnea    with activity   GERD (gastroesophageal reflux disease)    Glaucoma    Hyperlipidemia    Hypertension    Multiple myeloma ([place])    Pneumonia    Stage 1 infiltrating ductal carcinoma of right female breast ([place]) 08/21/2015   ER+ PR+ HER 2  neu + (3+) T1cN0    Past Surgical History:  Procedure Laterality Date   A/V FISTULAGRAM Right 07/05/2020   Procedure: A/V Fistulagram;  Surgeon: Elam Dutch, MD;  Location: [facility];  Service: Cardiovascular;  Laterality: Right;   AV FISTULA PLACEMENT Left 11/22/2017   Procedure: ARTERIOVENOUS (AV) FISTULA CREATION LEFT ARM;  Surgeon: Elam Dutch, MD;  Location: [facility] OR;  Service: Vascular;  Laterality: Left;   AV FISTULA PLACEMENT Right 04/04/2020   Procedure: RIGHT ARM ARTERIOVENOUS FISTULA CREATION;  Surgeon: Rosetta Posner, MD;  [facility]: [facility];   Service: Vascular;  Laterality: Right;   AV FISTULA PLACEMENT Right 08/20/2020   Procedure: ARTERIOVENOUS (AV) FISTULA LIGATION RIGHT ARM;  Surgeon: Elam Dutch, MD;  Location: [facility] OR;  Service: Vascular;  Laterality: Right;   BIOPSY  08/07/2016   Procedure: BIOPSY;  Surgeon: Daneil Dolin, MD;  [facility];  Service: Endoscopy;;  gastric ulcer biopsy   COLONOSCOPY     ESOPHAGOGASTRODUODENOSCOPY N/A 08/07/2016   LA Grade A esophagitis s/p dilation, small hiatal hernia, multiple gastric ulcers and erosions, duodenal erosions s/p biopsy. Negative H.pylori    ESOPHAGOGASTRODUODENOSCOPY N/A 11/27/2016   normal esophagus, previously noted gastric ulcers completely healed, normal duodenum.    ESOPHAGOGASTRODUODENOSCOPY (EGD) WITH PROPOFOL N/A 07/23/2020   Procedure: ESOPHAGOGASTRODUODENOSCOPY (EGD) WITH PROPOFOL;  Surgeon: Rogene Houston, MD;  [facility];  Service: Endoscopy;  Laterality: N/A;   FISTULA SUPERFICIALIZATION Left 02/14/2018   Procedure: FISTULA SUPERFICIALIZATION LEFT ARM;  Surgeon: Angelia Mould, MD;  Location: [facility] OR;  Service: Vascular;  Laterality: Left;   FRACTURE SURGERY Right    ankle   HOT HEMOSTASIS  07/23/2020   Procedure: HOT HEMOSTASIS (ARGON PLASMA COAGULATION/BICAP);  Surgeon: Rogene Houston, MD;  [facility];  Service: Endoscopy;;   INTRAMEDULLARY (IM) NAIL INTERTROCHANTERIC Right 07/12/2020   Procedure: INTRAMEDULLARY (IM) NAIL INTERTROCHANTRIC;  Surgeon: Mordecai Rasmussen, MD;  Location: [facility];  Service: Orthopedics;  Laterality: Right;   MALONEY DILATION N/A 08/07/2016   Procedure: Venia Minks DILATION;  Surgeon: Daneil Dolin, MD;  [facility];  Service: Endoscopy;  Laterality: N/A;   MASTECTOMY, PARTIAL Right    PERIPHERAL VASCULAR BALLOON ANGIOPLASTY Left 07/13/2019   Procedure: PERIPHERAL VASCULAR BALLOON ANGIOPLASTY;  Surgeon: Marty Heck, MD;  Location: [facility];  Service:  Cardiovascular;  Laterality: Left;  arm fistulogram   PERIPHERAL VASCULAR BALLOON ANGIOPLASTY Right 05/22/2020   Procedure: PERIPHERAL VASCULAR BALLOON ANGIOPLASTY;  Surgeon: Cherre Robins, MD;  Location: [facility];  Service: Cardiovascular;  Laterality: Right;  arm fistula   PERIPHERAL VASCULAR BALLOON ANGIOPLASTY Right 07/05/2020   Procedure: PERIPHERAL VASCULAR BALLOON ANGIOPLASTY;  Surgeon: Elam Dutch, MD;  Location: [facility] [facility];  Service: Cardiovascular;  Laterality: Right;  arm fistula   PORT-A-CATH REMOVAL Left 11/22/2017   Procedure: REMOVAL PORT-A-CATH LEFT CHEST;  Surgeon: Elam Dutch, MD;  Location: [place];  Service: Vascular;  Laterality: Left;   RETINAL DETACHMENT SURGERY Right    SCLEROTHERAPY  07/23/2020   Procedure: SCLEROTHERAPY;  Surgeon: Rogene Houston, MD;  [facility];  Service: Endoscopy;;   UPPER EXTREMITY VENOGRAPHY N/A 08/22/2020   Procedure: LEFT UPPER & CENTRAL VENOGRAPHY;  Surgeon: Marty Heck, MD;  Location: [facility];  Service: Cardiovascular;  Laterality: N/A;    SOCIAL HISTORY:  Social History   Socioeconomic History   Marital status: Single  Spouse name: Not on file   Number of children: Not on file   Years of education: Not on file   Highest education level: Not on file  Occupational History   Occupation: retired   Tobacco Use   Smoking status: Never   Smokeless tobacco: Never  Vaping Use   Vaping Use: Never used  Substance and Sexual Activity   Alcohol use: No    Alcohol/week: 0.0 standard drinks of alcohol   Drug use: No   Sexual activity: Never  Other Topics Concern   Not on file  Social History Narrative   Long term resident of SNF    Social Determinants of Health   Financial Resource Strain: [place]  (01/09/2020)   Overall Financial Resource Strain (CARDIA)    Difficulty of Paying Living Expenses: Not very hard  Food Insecurity: No Food Insecurity (01/09/2020)   Hunger  Vital Sign    Worried About Running Out of Food in the Last Year: Never true    Ran Out of Food in the Last Year: Never true  Transportation Needs: No Transportation Needs (01/09/2020)   PRAPARE - Hydrologist (Medical): No    Lack of Transportation (Non-Medical): No  Physical Activity: Inactive (01/09/2020)   Exercise Vital Sign    Days of Exercise per Week: 0 days    Minutes of Exercise per Session: 0 min  Stress: No Stress Concern Present (01/09/2020)   [place]    Feeling of Stress : Not at all  Social Connections: Moderately Isolated (01/09/2020)   Social Connection and Isolation Panel [NHANES]    Frequency of Communication with Friends and Family: More than three times a week    Frequency of Social Gatherings with Friends and Family: Once a week    Attends Religious Services: More than 4 times per year    Active Member of [employer] or Organizations: No    Attends Archivist Meetings: Never    Marital Status: Never married  Intimate Partner Violence: Not At Risk (01/09/2020)   Humiliation, Afraid, Rape, and Kick questionnaire    Fear of Current or Ex-Partner: No    Emotionally Abused: No    Physically Abused: No    Sexually Abused: No    FAMILY HISTORY:  Family History  Problem Relation Age of Onset   Multiple myeloma Sister    Brain cancer Sister    Dementia Mother        died at 59   Stroke Mother    Heart failure Mother    Diabetes Mother    Heart disease Father    Prostate cancer Brother    Colon cancer Neg Hx     CURRENT MEDICATIONS:  Current Outpatient Medications  Medication Sig Dispense Refill   acetaminophen (TYLENOL) 325 MG tablet Take 650 mg by mouth every 8 (eight) hours.     acyclovir (ZOVIRAX) 200 MG capsule Take 200 mg by mouth in the morning. (0800)     Amino Acids-Protein Hydrolys (FEEDING SUPPLEMENT, PRO-STAT SUGAR FREE 64,) LIQD Take 30 mLs by  mouth 3 (three) times daily with meals. (0800, 1200 & 1800)     anastrozole (ARIMIDEX) 1 MG tablet TAKE 1 TABLET BY MOUTH DAILY 30 tablet 6   Calcium Acetate 667 MG TABS Take 2 tablets by mouth 3 (three) times daily with meals. 1 tablet daily w/ snack     calcium-vitamin D (OSCAL WITH D) 500-200 MG-UNIT tablet  Take 1 tablet by mouth in the morning. (0800)     docusate sodium (COLACE) 100 MG capsule Take 100 mg by mouth 2 (two) times daily.     Insulin Pen Needle (BD AUTOSHIELD DUO) 30G X 5 MM MISC by Does not apply route. 3/16"     melatonin 3 MG TABS tablet Take 6 mg by mouth at bedtime. (2000) For Sleep     multivitamin (RENA-VIT) TABS tablet Take 1 tablet by mouth at bedtime. (2100)     ondansetron (ZOFRAN) 4 MG tablet Take 1 tablet (4 mg total) by mouth every 6 (six) hours as needed for nausea. 20 tablet 0   pantoprazole (PROTONIX) 40 MG tablet Take 40 mg by mouth 2 (two) times daily.     sertraline (ZOLOFT) 25 MG tablet Take 25 mg by mouth daily. At 9 am along with 50 mg tablet for a total of 75 mg daily     sertraline (ZOLOFT) 50 MG tablet Take 50 mg by mouth daily. At 9 am along with 25 mg tablet for a total of 75 mg daily     sucralfate (CARAFATE) 1 GM/10ML suspension Take 1 g by mouth in the morning, at noon, in the evening, and at bedtime. (0800, 1200, 1700 & 2100)     No current facility-administered medications for this visit.   Facility-Administered Medications Ordered in Other Visits  Medication Dose Route Frequency Provider Last Rate Last Admin   lanreotide acetate (SOMATULINE DEPOT) 120 MG/0.5ML injection            lanreotide acetate (SOMATULINE DEPOT) 120 MG/0.5ML injection            octreotide (SANDOSTATIN LAR) 30 MG IM injection            octreotide (SANDOSTATIN LAR) 30 MG IM injection             ALLERGIES:  Allergies  Allergen Reactions   Ace Inhibitors Other (See Comments) and Cough    Tongue swell , ie angioedema   Angiotensin Receptor Blockers     Angioedema  with ACE-I   Penicillins Other (See Comments)    Unsure of reaction Has patient had a PCN reaction causing immediate rash, facial/tongue/throat swelling, SOB or lightheadedness with hypotension: Unknown Has patient had a PCN reaction causing severe rash involving mucus membranes or skin necrosis: Unknown Has patient had a PCN reaction that required hospitalization: No Has patient had a PCN reaction occurring within the last 10 years: Unknown If all of the above answers are "NO", then may proceed with Cephalosporin use.     Penicillin G     PHYSICAL EXAM:  Performance status (ECOG): 2 - Symptomatic, <50% confined to bed  Vitals:   12/23/21 1321  BP: 131/77  Pulse: (!) 102  Resp: 19  Temp: 98.7 F (37.1 C)  SpO2: 94%   Wt Readings from Last 3 Encounters:  12/19/21 142 lb 9.6 oz (64.7 kg)  12/17/21 142 lb 9.6 oz (64.7 kg)  12/10/21 142 lb 9.6 oz (64.7 kg)   Physical Exam Vitals reviewed.  Constitutional:      Appearance: Normal appearance.     Comments: In wheelchair  Cardiovascular:     Rate and Rhythm: Normal rate and regular rhythm.     Pulses: Normal pulses.     Heart sounds: Normal heart sounds.  Pulmonary:     Effort: Pulmonary effort is normal.     Breath sounds: Normal breath sounds.  Neurological:     General:  No focal deficit present.     Mental Status: She is alert and oriented to person, place, and time.  Psychiatric:        Mood and Affect: Mood normal.        Behavior: Behavior normal.     LABORATORY DATA:  I have reviewed the labs as listed.     Latest Ref Rng & Units 12/16/2021   12:40 PM 11/25/2021   12:57 PM 10/21/2021   10:48 AM  CBC  WBC 4.0 - 10.5 K/uL 5.3  4.9  5.6   Hemoglobin 12.0 - 15.0 g/dL 10.1  9.4  10.0   Hematocrit 36.0 - 46.0 % 33.0  30.7  32.0   Platelets 150 - 400 K/uL 256  237  265       Latest Ref Rng & Units 12/16/2021   12:40 PM 11/25/2021   12:57 PM 10/21/2021   10:48 AM  CMP  Glucose 70 - 99 mg/dL 119  171  213    BUN 8 - 23 mg/dL 24  27  37   Creatinine 0.44 - 1.00 mg/dL 6.62  6.88  6.50   Sodium 135 - 145 mmol/L 136  137  138   Potassium 3.5 - 5.1 mmol/L 4.2  4.4  4.0   Chloride 98 - 111 mmol/L 97  98  98   CO2 22 - 32 mmol/L _0 Calcium 8.9 - 10.3 mg/dL 9.3  9.0  9.2   Total Protein 6.5 - 8.1 g/dL 6.9  6.4  6.9   Total Bilirubin 0.3 - 1.2 mg/dL 0.5  0.4  0.3   Alkaline Phos 38 - 126 U/L 78  72  76   AST 15 - 41 U/L _1 ALT 0 - 44 U/L _2 DIAGNOSTIC IMAGING:  I have independently reviewed the scans and discussed with the patient. No results found.   ASSESSMENT:  1.  IgA lambda plasma cell myeloma, stage II, standard risk: -BMBX on 09/20/2018 shows plasma cell myeloma, 30% plasma cells.  FISH panel was normal.  Chromosome analysis normal. -Labs at diagnosis on 08/29/2018 with M spike 0.9 g.  Kappa light chains 148, lambda light chains 1132, ratio 0.13.  LDH normal.  Beta-2 microglobulin 13.5.  She had transfusion dependent anemia. -Velcade and dexamethasone started on 10/11/2018. -Myeloma panel on 07/11/2019 shows SPEP is negative.  Free light chain ratio improved to 1.09.  Lambda light chains are 128, improved from 141.  Kappa light chains are 140. -She has [facility] at [facility] 10/12[facility].   2.  Stage I IDC of the right breast, ER/PR positive, HER-2 negative: -She is on anastrozole.   PLAN:  1.  IgA lambda plasma cell myeloma: - She is tolerating Velcade every other week very well along with dexamethasone 10 mg. - Reviewed labs from 12/16/2021 which showed M spike not observed.  However lambda light chains increased to 445 from 348.  Ratio is within normal limits.  CBC grossly was normal with hemoglobin 10.1.  Calcium was normal along with normal LFTs. - Recommend continuing Velcade every 2 weeks at this time.  We will closely monitor lambda light chains as they are trending up. - RTC 3 months with repeat labs.   2.  ESRD on HD: - Continue HD on  Monday, Wednesday and Friday.   3.  Bone strengthening: - Bisphosphonates were not started due to  patient being on dialysis.   4.  Osteopenia: - Last bone density was in 2017.  We will plan to repeat it in the next few months.   5.  Right breast cancer: - Continue anastrozole.  She is tolerating well.   Orders placed this encounter:  No orders of the defined types were placed in this encounter.    Derek Jack, MD [place] [phone]

## 2021-12-23 NOTE — Progress Notes (Addendum)
Patient presents today for Velcade injection per providers order.  Vital signs and Labs on 12/16/21 reveiwed by the MD.  Message received from Anastasio Champion RN/Dr. Delton Coombes, okay to proceed with treatment.  Velcade administration without incident; injection site WNL; see MAR for injection details.  Patient tolerated procedure well and without incident.  No questions or complaints noted at this time.

## 2021-12-23 NOTE — Patient Instructions (Addendum)
[place] [facility] Discharge Instructions   You were seen and examined today by Dr. Delton Coombes.  He reviewed the results of your lab work. They are normal/stable.   We will proceed with your shot today.  Return as scheduled.    Thank you for choosing [place] at [facility] to provide your oncology and hematology care.  To afford each patient quality time with our provider, please arrive at least 15 minutes before your scheduled appointment time.   If you have a lab appointment with the [facility] please come in thru the Main Entrance and check in at the main information desk.  You need to re-schedule your appointment should you arrive 10 or more minutes late.  We strive to give you quality time with our providers, and arriving late affects you and other patients whose appointments are after yours.  Also, if you no show three or more times for appointments you may be dismissed from the clinic at the providers discretion.     Again, thank you for choosing [facility].  Our hope is that these requests will decrease the amount of time that you wait before being seen by our physicians.       _____________________________________________________________  Should you have questions after your visit to [facility], please contact our office at [phone] and follow the prompts.  Our office hours are 8:00 a.m. and 4:30 p.m. Monday - Friday.  Please note that voicemails left after 4:00 p.m. may not be returned until the following business day.  We are closed weekends and major holidays.  You do have access to a nurse 24-7, just call the main number to the clinic [phone] and do not press any options, hold on the line and a nurse will answer the phone.    For prescription refill requests, have your pharmacy contact our office and allow 72 hours.    Due to Covid, you will need to wear a mask upon entering the  hospital. If you do not have a mask, a mask will be given to you at the Main Entrance upon arrival. For doctor visits, patients may have 1 support person age 10 or older with them. For treatment visits, patients can not have anyone with them due to social distancing guidelines and our immunocompromised population.

## 2021-12-24 ENCOUNTER — Other Ambulatory Visit: Payer: Self-pay

## 2021-12-24 DIAGNOSIS — Z992 Dependence on renal dialysis: Secondary | ICD-10-CM | POA: Diagnosis not present

## 2021-12-24 DIAGNOSIS — N186 End stage renal disease: Secondary | ICD-10-CM | POA: Diagnosis not present

## 2021-12-24 DIAGNOSIS — N2581 Secondary hyperparathyroidism of renal origin: Secondary | ICD-10-CM | POA: Diagnosis not present

## 2021-12-26 ENCOUNTER — Other Ambulatory Visit: Payer: Self-pay

## 2021-12-26 DIAGNOSIS — N2581 Secondary hyperparathyroidism of renal origin: Secondary | ICD-10-CM | POA: Diagnosis not present

## 2021-12-26 DIAGNOSIS — Z992 Dependence on renal dialysis: Secondary | ICD-10-CM | POA: Diagnosis not present

## 2021-12-26 DIAGNOSIS — N186 End stage renal disease: Secondary | ICD-10-CM | POA: Diagnosis not present

## 2021-12-29 DIAGNOSIS — N2581 Secondary hyperparathyroidism of renal origin: Secondary | ICD-10-CM | POA: Diagnosis not present

## 2021-12-29 DIAGNOSIS — N186 End stage renal disease: Secondary | ICD-10-CM | POA: Diagnosis not present

## 2021-12-29 DIAGNOSIS — Z992 Dependence on renal dialysis: Secondary | ICD-10-CM | POA: Diagnosis not present

## 2021-12-31 DIAGNOSIS — N186 End stage renal disease: Secondary | ICD-10-CM | POA: Diagnosis not present

## 2021-12-31 DIAGNOSIS — Z992 Dependence on renal dialysis: Secondary | ICD-10-CM | POA: Diagnosis not present

## 2021-12-31 DIAGNOSIS — N2581 Secondary hyperparathyroidism of renal origin: Secondary | ICD-10-CM | POA: Diagnosis not present

## 2022-01-01 ENCOUNTER — Non-Acute Institutional Stay (SKILLED_NURSING_FACILITY): Payer: Medicare PPO | Admitting: Adult Health

## 2022-01-01 DIAGNOSIS — F01518 Vascular dementia, unspecified severity, with other behavioral disturbance: Secondary | ICD-10-CM

## 2022-01-01 DIAGNOSIS — K219 Gastro-esophageal reflux disease without esophagitis: Secondary | ICD-10-CM | POA: Diagnosis not present

## 2022-01-01 DIAGNOSIS — I70209 Unspecified atherosclerosis of native arteries of extremities, unspecified extremity: Secondary | ICD-10-CM | POA: Diagnosis not present

## 2022-01-01 DIAGNOSIS — F339 Major depressive disorder, recurrent, unspecified: Secondary | ICD-10-CM

## 2022-01-01 DIAGNOSIS — M810 Age-related osteoporosis without current pathological fracture: Secondary | ICD-10-CM

## 2022-01-01 DIAGNOSIS — Z170 Estrogen receptor positive status [ER+]: Secondary | ICD-10-CM

## 2022-01-01 DIAGNOSIS — E785 Hyperlipidemia, unspecified: Secondary | ICD-10-CM

## 2022-01-01 DIAGNOSIS — K269 Duodenal ulcer, unspecified as acute or chronic, without hemorrhage or perforation: Secondary | ICD-10-CM | POA: Diagnosis not present

## 2022-01-01 DIAGNOSIS — D631 Anemia in chronic kidney disease: Secondary | ICD-10-CM

## 2022-01-01 DIAGNOSIS — E43 Unspecified severe protein-calorie malnutrition: Secondary | ICD-10-CM

## 2022-01-01 DIAGNOSIS — I700 Atherosclerosis of aorta: Secondary | ICD-10-CM | POA: Diagnosis not present

## 2022-01-01 DIAGNOSIS — E1169 Type 2 diabetes mellitus with other specified complication: Secondary | ICD-10-CM

## 2022-01-01 DIAGNOSIS — Z992 Dependence on renal dialysis: Secondary | ICD-10-CM

## 2022-01-01 DIAGNOSIS — C9000 Multiple myeloma not having achieved remission: Secondary | ICD-10-CM

## 2022-01-01 DIAGNOSIS — K259 Gastric ulcer, unspecified as acute or chronic, without hemorrhage or perforation: Secondary | ICD-10-CM

## 2022-01-01 DIAGNOSIS — I120 Hypertensive chronic kidney disease with stage 5 chronic kidney disease or end stage renal disease: Secondary | ICD-10-CM

## 2022-01-01 DIAGNOSIS — N186 End stage renal disease: Secondary | ICD-10-CM

## 2022-01-01 DIAGNOSIS — C50111 Malignant neoplasm of central portion of right female breast: Secondary | ICD-10-CM

## 2022-01-01 DIAGNOSIS — E1122 Type 2 diabetes mellitus with diabetic chronic kidney disease: Secondary | ICD-10-CM | POA: Diagnosis not present

## 2022-01-02 ENCOUNTER — Non-Acute Institutional Stay (SKILLED_NURSING_FACILITY): Payer: Medicare PPO | Admitting: Adult Health

## 2022-01-02 ENCOUNTER — Encounter: Payer: Self-pay | Admitting: Adult Health

## 2022-01-02 DIAGNOSIS — Z170 Estrogen receptor positive status [ER+]: Secondary | ICD-10-CM | POA: Diagnosis not present

## 2022-01-02 DIAGNOSIS — N2581 Secondary hyperparathyroidism of renal origin: Secondary | ICD-10-CM | POA: Diagnosis not present

## 2022-01-02 DIAGNOSIS — C50111 Malignant neoplasm of central portion of right female breast: Secondary | ICD-10-CM | POA: Diagnosis not present

## 2022-01-02 DIAGNOSIS — N186 End stage renal disease: Secondary | ICD-10-CM | POA: Diagnosis not present

## 2022-01-02 DIAGNOSIS — F01518 Vascular dementia, unspecified severity, with other behavioral disturbance: Secondary | ICD-10-CM

## 2022-01-02 DIAGNOSIS — Z992 Dependence on renal dialysis: Secondary | ICD-10-CM

## 2022-01-02 DIAGNOSIS — E1122 Type 2 diabetes mellitus with diabetic chronic kidney disease: Secondary | ICD-10-CM | POA: Diagnosis not present

## 2022-01-02 NOTE — Progress Notes (Unsigned)
Provider:Kalley Nicholl [name]  Location  [facility]   PCP: Gerlene Fee, NP  Extended Emergency Contact Information Primary Emergency Contact: Carpenito,Annie Address: [address]          [place], [place] [address] [place] Phone: [phone] Relation: Sister Secondary Emergency Contact: [place] Phone: [phone] Relation: Brother  Codes status: dnr  Goals of care: advanced directive information    12/23/2021    2:22 PM  Advanced Directives  Does Patient Have a Medical Advance Directive? Yes  Type of Paramedic of [place];Out of facility DNR (pink MOST or yellow form)  Copy of [place] in Chart? No - copy requested  Would patient like information on creating a medical advance directive? No - Patient declined  Pre-existing out of facility DNR order (yellow form or pink MOST form) Yellow form placed in chart (order not valid for inpatient use)     Allergies  Allergen Reactions   Ace Inhibitors Other (See Comments) and Cough    Tongue swell , ie angioedema   Angiotensin Receptor Blockers     Angioedema with ACE-I   Penicillins Other (See Comments)    Unsure of reaction Has patient had a PCN reaction causing immediate rash, facial/tongue/throat swelling, SOB or lightheadedness with hypotension: Unknown Has patient had a PCN reaction causing severe rash involving mucus membranes or skin necrosis: Unknown Has patient had a PCN reaction that required hospitalization: No Has patient had a PCN reaction occurring within the last 10 years: Unknown If all of the above answers are "NO", then may proceed with Cephalosporin use.     Penicillin G     Chief Complaint  Patient presents with   Annual Exam    HPI She is an 86 year old woman being seen for her annual exam. She has had one hospitalization where her beta blocker was stopped. She continues with dialysis three days weekly she does complain of  nausea at meal times. She does have prn zofran; but does not utilize this. Staff reports that she is coughing with water. She continues to be followed for her chronic illnesses including: Major depression chronic recurrent:  Protein calorie malnutrition: Multiple gastric ulcers/duodenal ulcer/ upper GI bleed: esophageal reflux disease without esophagitis: History of right hip fracture    Past Medical History:  Diagnosis Date   Anemia     chronic macrocytic anemia   Anxiety    Chronic kidney disease    Chronic renal disease, stage 4, severely decreased glomerular filtration rate (GFR) between 15-29 mL/min/1.73 square meter ([place]) 10/62/6948   Complication of anesthesia    delirious after Breast Surgery   Dementia ([place])    mild   Depression    Diabetes mellitus with ESRD (end-stage renal disease) ([place])    type II   Dysphagia    Dyspnea    with activity   GERD (gastroesophageal reflux disease)    Glaucoma    Hyperlipidemia    Hypertension    Multiple myeloma (H[facility])    Pneumonia    Stage 1 infiltrating ductal carcinoma of right female breast ([place]) 08/21/2015   ER+ PR+ HER 2 neu + (3+) T1cN0    Past Surgical History:  Procedure Laterality Date   A/V FISTULAGRAM Right 07/05/2020   Procedure: A/V Fistulagram;  Surgeon: Elam Dutch, MD;  Location: [place] [facility];  Service: Cardiovascular;  Laterality: Right;   AV FISTULA PLACEMENT Left 11/22/2017   Procedure: ARTERIOVENOUS (AV)  FISTULA CREATION LEFT ARM;  Surgeon: Elam Dutch, MD;  Location: [facility];  Service: Vascular;  Laterality: Left;   AV FISTULA PLACEMENT Right 04/04/2020   Procedure: RIGHT ARM ARTERIOVENOUS FISTULA CREATION;  Surgeon: Rosetta Posner, MD;  [facility];  Service: Vascular;  Laterality: Right;   AV FISTULA PLACEMENT Right 08/20/2020   Procedure: ARTERIOVENOUS (AV) FISTULA LIGATION RIGHT ARM;  Surgeon: Elam Dutch, MD;  Location: [facility] OR;  Service: Vascular;  Laterality: Right;   BIOPSY   08/07/2016   Procedure: BIOPSY;  Surgeon: Daneil Dolin, MD;  [facility];  Service: Endoscopy;;  gastric ulcer biopsy   COLONOSCOPY     ESOPHAGOGASTRODUODENOSCOPY N/A 08/07/2016   LA Grade A esophagitis s/p dilation, small hiatal hernia, multiple gastric ulcers and erosions, duodenal erosions s/p biopsy. Negative H.pylori    ESOPHAGOGASTRODUODENOSCOPY N/A 11/27/2016   normal esophagus, previously noted gastric ulcers completely healed, normal duodenum.    ESOPHAGOGASTRODUODENOSCOPY (EGD) WITH PROPOFOL N/A 07/23/2020   Procedure: ESOPHAGOGASTRODUODENOSCOPY (EGD) WITH PROPOFOL;  Surgeon: Rogene Houston, MD;  [facility];  Service: Endoscopy;  Laterality: N/A;   FISTULA SUPERFICIALIZATION Left 02/14/2018   Procedure: FISTULA SUPERFICIALIZATION LEFT ARM;  Surgeon: Angelia Mould, MD;  Location: [facility];  Service: Vascular;  Laterality: Left;   FRACTURE SURGERY Right    ankle   HOT HEMOSTASIS  07/23/2020   Procedure: HOT HEMOSTASIS (ARGON PLASMA COAGULATION/BICAP);  Surgeon: Rogene Houston, MD;  [facility];  Service: Endoscopy;;   INTRAMEDULLARY (IM) NAIL INTERTROCHANTERIC Right 07/12/2020   Procedure: INTRAMEDULLARY (IM) NAIL INTERTROCHANTRIC;  Surgeon: Mordecai Rasmussen, MD;  [facility];  Service: Orthopedics;  Laterality: Right;   MALONEY DILATION N/A 08/07/2016   Procedure: Venia Minks DILATION;  Surgeon: Daneil Dolin, MD;  [facility];  Service: Endoscopy;  Laterality: N/A;   MASTECTOMY, PARTIAL Right    PERIPHERAL VASCULAR BALLOON ANGIOPLASTY Left 07/13/2019   Procedure: PERIPHERAL VASCULAR BALLOON ANGIOPLASTY;  Surgeon: Marty Heck, MD;  Location: [place] [facility];  Service: Cardiovascular;  Laterality: Left;  arm fistulogram   PERIPHERAL VASCULAR BALLOON ANGIOPLASTY Right 05/22/2020   Procedure: PERIPHERAL VASCULAR BALLOON ANGIOPLASTY;  Surgeon: Cherre Robins, MD;  Location: [place] [facility];  Service:  Cardiovascular;  Laterality: Right;  arm fistula   PERIPHERAL VASCULAR BALLOON ANGIOPLASTY Right 07/05/2020   Procedure: PERIPHERAL VASCULAR BALLOON ANGIOPLASTY;  Surgeon: Elam Dutch, MD;  Location: [place] [facility];  Service: Cardiovascular;  Laterality: Right;  arm fistula   PORT-A-CATH REMOVAL Left 11/22/2017   Procedure: REMOVAL PORT-A-CATH LEFT CHEST;  Surgeon: Elam Dutch, MD;  Location: [place];  Service: Vascular;  Laterality: Left;   RETINAL DETACHMENT SURGERY Right    SCLEROTHERAPY  07/23/2020   Procedure: SCLEROTHERAPY;  Surgeon: Rogene Houston, MD;  [facility];  Service: Endoscopy;;   UPPER EXTREMITY VENOGRAPHY N/A 08/22/2020   Procedure: LEFT UPPER & CENTRAL VENOGRAPHY;  Surgeon: Marty Heck, MD;  Location: [place] [facility];  Service: Cardiovascular;  Laterality: N/A;    reports that she has never smoked. She has never used smokeless tobacco. She reports that she does not drink alcohol and does not use drugs. Social History   Tobacco Use   Smoking status: Never   Smokeless tobacco: Never  Vaping Use   Vaping Use: Never used  Substance Use Topics   Alcohol use: No    Alcohol/week: 0.0 standard drinks of alcohol   Drug  use: No   Family History  Problem Relation Age of Onset   Multiple myeloma Sister    Brain cancer Sister    Dementia Mother        died at 72   Stroke Mother    Heart failure Mother    Diabetes Mother    Heart disease Father    Prostate cancer Brother    Colon cancer Neg Hx     Pertinent  Health Maintenance Due  Topic Date Due   FOOT EXAM  10/07/2021   OPHTHALMOLOGY EXAM  02/12/2022   HEMOGLOBIN A1C  04/12/2022   INFLUENZA VACCINE  Completed   DEXA SCAN  Completed      11/25/2021    3:00 PM 12/17/2021   11:58 AM 12/19/2021    8:44 AM 12/19/2021   12:01 PM 12/23/2021    1:26 PM  Fall Risk  Falls in the past year?  0 0 1   Was there an injury with Fall?  0 0 0   Fall Risk Category Calculator  0 0 2    Fall Risk Category  Low Low Moderate   Patient Fall Risk Level High fall risk Low fall risk Low fall risk Moderate fall risk High fall risk  Patient at Risk for Falls Due to  No Fall Risks No Fall Risks History of fall(s);Impaired balance/gait;Impaired mobility   Fall risk Follow up  Falls evaluation completed Falls evaluation completed Falls evaluation completed       12/19/2021   12:04 PM 03/27/2021   12:36 PM 03/07/2021   12:43 PM 12/17/2020   10:08 AM 01/09/2020    2:45 PM  Depression screen PHQ 2/9  Decreased Interest 0 0 0 0 0  Down, Depressed, Hopeless 0 0 0 0 0  PHQ - 2 Score 0 0 0 0 0  Altered sleeping  0     Tired, decreased energy  0     Change in appetite  0     Feeling bad or failure about yourself   0     Trouble concentrating  0     Moving slowly or fidgety/restless  0     Suicidal thoughts  0     PHQ-9 Score  0       Functional Status Survey:    Outpatient Encounter Medications as of 01/01/2022  Medication Sig   acetaminophen (TYLENOL) 325 MG tablet Take 650 mg by mouth every 8 (eight) hours.   acyclovir (ZOVIRAX) 200 MG capsule Take 200 mg by mouth in the morning. (0800)   Amino Acids-Protein Hydrolys (FEEDING SUPPLEMENT, PRO-STAT SUGAR FREE 64,) LIQD Take 30 mLs by mouth 3 (three) times daily with meals. (0800, 1200 & 1800)   anastrozole (ARIMIDEX) 1 MG tablet TAKE 1 TABLET BY MOUTH DAILY   Calcium Acetate 667 MG TABS Take 2 tablets by mouth 3 (three) times daily with meals. 1 tablet daily w/ snack   calcium-vitamin D (OSCAL WITH D) 500-200 MG-UNIT tablet Take 1 tablet by mouth in the morning. (0800)   docusate sodium (COLACE) 100 MG capsule Take 100 mg by mouth 2 (two) times daily.   Insulin Pen Needle (BD AUTOSHIELD DUO) 30G X 5 MM MISC by Does not apply route. 3/16"   melatonin 3 MG TABS tablet Take 6 mg by mouth at bedtime. (2000) For Sleep   multivitamin (RENA-VIT) TABS tablet Take 1 tablet by mouth at bedtime. (2100)   ondansetron (ZOFRAN) 4 MG tablet  Take 1 tablet (4 mg total)  by mouth every 6 (six) hours as needed for nausea.   pantoprazole (PROTONIX) 40 MG tablet Take 40 mg by mouth 2 (two) times daily.   sertraline (ZOLOFT) 25 MG tablet Take 25 mg by mouth daily. At 9 am along with 50 mg tablet for a total of 75 mg daily   sertraline (ZOLOFT) 50 MG tablet Take 50 mg by mouth daily. At 9 am along with 25 mg tablet for a total of 75 mg daily   sucralfate (CARAFATE) 1 GM/10ML suspension Take 1 g by mouth in the morning, at noon, in the evening, and at bedtime. (0800, 1200, 1700 & 2100)   Facility-Administered Encounter Medications as of 01/01/2022  Medication   lanreotide acetate (SOMATULINE DEPOT) 120 MG/0.5ML injection   lanreotide acetate (SOMATULINE DEPOT) 120 MG/0.5ML injection   octreotide (SANDOSTATIN LAR) 30 MG IM injection   octreotide (SANDOSTATIN LAR) 30 MG IM injection     Vitals:   01/01/22 0902  BP: (!) 154/91  Pulse: 88  Resp: 18  Temp: (!) 97.3 F (36.3 C)  SpO2: 96%  Weight: 142 lb 9.6 oz (64.7 kg)  Height: _0  (1.6 m)   Body mass index is 25.26 kg/m.    SIGNIFICANT DIAGNOSTIC EXAMS  PREVIOUS   10-01-21: chest x-ray: No evidence of active cardiopulmonary process.   10-01-21: abdominal ultrasound:  1. Cholelithiasis with no evidence of acute cholecystitis. 2. Increased echogenicity of the liver parenchyma, findings suggestive of hepatic steatosis.  10-01-21: ct of chest abdomen and pelvis  No aspiration pneumonia. Mild focal scarring at the medial left lung base.   Coronary artery calcification. Aortic atherosclerotic calcification. Aortic Atherosclerosis  Probably acute superior endplate fractures at T55, L1 and L2. No retropulsed bone.   Renal atrophy. Multiple renal cysts. Indeterminate 2.7 cm lesion at the lower pole the right kidney could be a complicated cyst or a mass. This could be evaluated with renal MRI. 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 Nonobstructing 5 mm stone in the lower pole of  the left kidney.   1 x 2 cm cystic abnormality in the ventral head of the pancreas. Best practice recommendation is for reimaging of this every 6 months for 2 years and then every 1 year for 2 years and then every 2 years for 6 years. Follow-up can be discontinued if stable for 10 years. This recommendation could be tailored to this patient's clinical status and life expectancy.  Paget's disease of the right hemipelvis. Diverticulosis without evidence of diverticulitis. Small hiatal hernia. Multiple calcified leiomyomas the uterus.  10-24-21: DEXA: t score -2.957  NO NEW EXAMS      LABS REVIEWED PREVIOUS   12-17-20: wbc 5.2; hgb 10.5; hct 33.8; mcv 98.8 plt 256; glucose 136; bun 43; creat 6.19; k+ 3.6; na++ 137; ca 8.9; GFR 6 liver normal albumin 3.9  03-06-21: wbc 4.7; hgb 12.3; hct 38.2; mcv 101.6 plt 221; glucose 205; bun 51; creat 7.06; k+ 4.5; na++ 133; ca 9.1; GFR 5; liver normal albumin 3.8  03-20-21: wbc 5.4; hgb 12.1; hct 37.7; mcv 97.2 plt 254; glucose 111; bun 47; creat 7.34; k+ 4.6; na++ 131; ca 9.6 GFR 5; liver normal albumin 3.9; mag 2.2 03-27-21: hgb a1c 6.1   04-29-21; wbc 5.4; hgb 11.0; hct 34.6;mcv 100.9 plt 179; glucose 82; bun 44; creat 7.04; k+ 4.6; na++ 139; ca 8.7; GFR 5 d-dimer: 1.25; CRP 4.0   05-27-21: wbc 6.0; hgb 10.6; hct 33.5; mcv 101.2 plt 261; glucose 147; bun 45;  creat 6.86; k+ 4.5; na++ 138; ca 8.3; GFR 6; protein 6.9; albumin 3.8  06-12-21: wbc 6.5; hgb 8.8; hct 27.7; mcv 101.5 plt 229; glucose 153; bun 40; creat 6.53; k+ 4.3; na++ 136; ca 9.5; GFR 6 protein 6.5; albumin 3.4 mag 2.2  07-07-21: chol 171; ldl 89; trig 265; hdl 29  07-08-21: wbc 15.7; hgb 9.8; hct 32.8; mcv 104.8; plt 226; glucose 164; bun 51; creat 7.74; k+ 4.4; na++ 138; ca 9.7; gfr 5 protein 7.5; albumin 3.9 mag 2.1; chol 171; ldl 89; trig 265; hdl 29; hgb a1c 6.1 08-12-21: wbc 6.3; hgb 10.0; hct 31.9; mcv 103.9 plt 215; glucose 197; bun 40;creat 6.25; k+ 4.2; na++138; ca 9.3; gfr 6; protein 6.7; albumin  3.7; iron 93; tibc 142; ferritin 312 09-30-21: wbc 6.6; hgb 9.6; hct 31.1; mcv 101.3 plt 232; glucose 222; bun 40; creat 6.78; k+ 4.0; na++ 136; ca 9.2; gfr 6; protein 6.8 albumin 3.7  10-01-21: wbc 13.0; hgb 9.1; hct 28.8; mcv 99.3 plt 251; glucose 198; bun 59; creat 9.01; k+ 3.7; na++ 135; ca 9.6; gfr 4; ast 212; alt 173; protein 6.7; albumin 3.6  10-03-21: wbc 7.2; hgb 8.6; hct 27.5; mcv 99.6 plt 179 glucose 105; bun 34; creat 7.04; k+ 3.6; na++ 135; ca 8.8; gfr 5 ast 45; alt 99; protein 6.5; albumin 3.5  10-10-21: hgb a1c 5.3  11-25-21: wbc 4.9; hgb 9.4; hct 30.7; mcv 100.7 plt 237; glucose 171; bun 27; creat 6.88; k+ 4.4; na++ 137; ca 9.0; gfr 6; protein 6.4 albumin 3.5   TODAY 12-16-21: wbc 5.3; hgb 10.1; hct 33.0 mcv 98.5 plt 256; glucose 119; bun 24; creat 6.62; k+ 4.2 na++ 136; ca 9.3 gfr 6; protein 6.4 albumin 3.5; iron 54; tibc 140; ferritin 272   Review of Systems  Constitutional:  Negative for malaise/fatigue.  Respiratory:  Negative for cough and shortness of breath.   Cardiovascular:  Negative for chest pain, palpitations and leg swelling.  Gastrointestinal:  Negative for abdominal pain, constipation and heartburn.  Musculoskeletal:  Negative for back pain, joint pain and myalgias.  Skin: Negative.   Neurological:  Negative for dizziness.  Psychiatric/Behavioral:  The patient is not nervous/anxious.    Physical Exam Constitutional:      General: She is not in acute distress.    Appearance: She is well-developed. She is not diaphoretic.  HENT:     Nose: Nose normal.     Mouth/Throat:     Mouth: Mucous membranes are moist.  Eyes:     Conjunctiva/sclera: Conjunctivae normal.  Neck:     Thyroid: No thyromegaly.  Cardiovascular:     Rate and Rhythm: Normal rate and regular rhythm.     Pulses: Normal pulses.     Heart sounds: Normal heart sounds.  Pulmonary:     Effort: Pulmonary effort is normal. No respiratory distress.     Breath sounds: Normal breath sounds.  Abdominal:      General: Bowel sounds are normal. There is no distension.     Palpations: Abdomen is soft.     Tenderness: There is no abdominal tenderness.  Musculoskeletal:        General: Normal range of motion.     Cervical back: Neck supple.     Right lower leg: No edema.     Left lower leg: No edema.  Lymphadenopathy:     Cervical: No cervical adenopathy.  Skin:    General: Skin is warm and dry.     Comments: Chest wall  dialysis access   Neurological:     Mental Status: She is alert. Mental status is at baseline.  Psychiatric:        Mood and Affect: Mood normal.      ASSESSMENT/PLAN  TODAY  Major depression chronic recurrent: will continue zoloft 75 mg daily   2. Protein calorie malnutrition: protein 6.5 albumin 35 will continue prostat and supplements  3. Multiple gastric ulcers/duodenal ulcer/ upper GI bleed: esophageal reflux disease without esophagitis: will continue protonix 40 mg twice daily will change to zofran 4 mg ac  4. History of right hip fracture: will continue tylenol 650 mg three times daily   5. Chronic kidney disease with end stage renal disease on dialysis dye to type 2 diabetes mellitus:/dependence on dialysis: will continue dialysis three days week;h 1200 cc fluid restriction; calcium acetate 1334 mg with meals  6. Hypertension due to end stage renal disease (ESRD) due to type 2 diabetes mellitus: b/p 154/91 is off tenormin and clonidine   7. Hyperlipidemia associated with type 2 diabetes mellitus: LDL 89 is off lipitor due to elevated live enzymes  8. Aortic atherosclerosis/ atherosclerotic peripheral vascular disease: (ct 10-04-18) LDL 89  9. Vascular dementia with behavioral disturbance: weight is 142 pounds;   10. Diabetes mellitus type 2 with end stage renal disease (ESRD) hgb a1c 6.1 off lantus  11. Anemia due to end stage renal disease: hgb 10.1   12. Post menopausal osteoporosis: t score -2.957 will continue prolia 60 mg every 6 months with  calcium and vitamin d   13. Stage 1 infiltrating ductal carcinoma of female right breast/status post right mastectomy will continue arimidex 1 mg daily   14. Multiple myeloma not having achieved remission: followed by oncology  15. Herpes without outbreak will continue acyclovir 200 mg daily     Ok Edwards NP [facility]   call [phone]

## 2022-01-02 NOTE — Progress Notes (Unsigned)
Location:  [place] Room Number: 148 Place of Service:  SNF (31) Provider:  Ok Edwards, NP  CODE STATUS: DNR  Allergies  Allergen Reactions   Ace Inhibitors Other (See Comments) and Cough    Tongue swell , ie angioedema   Angiotensin Receptor Blockers     Angioedema with ACE-I   Penicillins Other (See Comments)    Unsure of reaction Has patient had a PCN reaction causing immediate rash, facial/tongue/throat swelling, SOB or lightheadedness with hypotension: Unknown Has patient had a PCN reaction causing severe rash involving mucus membranes or skin necrosis: Unknown Has patient had a PCN reaction that required hospitalization: No Has patient had a PCN reaction occurring within the last 10 years: Unknown If all of the above answers are "NO", then may proceed with Cephalosporin use.     Penicillin G     Chief Complaint  Patient presents with   Acute Visit    Care plan meeting    HPI:  We have come together for her care plan meeting. Family present. BIMS 14/15 mood 3/30: some depression. She is nonambulatory dependent locomotion with no falls. She requires moderate assist with her adls. She is frequently incontinent of bladder and bowel. She is on hemodialysis three days per week. Dietary: setup for eating; weight is 142.6 pounds; D3 with 1200 cc fluid restriction appetite 25-75% of meals. Therapy none at this time. Activities: periodically participates. She continues to be followed for her chronic illnesses including:  Chronic kidney disease with end stage renal disease on hemodialysis due to type 2 diabetes   Vascular Dementia with behavioral disturbance   Malignant neoplasm of central portion of right breast in female estrogen receptor positive  Past Medical History:  Diagnosis Date   Anemia     chronic macrocytic anemia   Anxiety    Chronic kidney disease    Chronic renal disease, stage 4, severely decreased glomerular filtration rate (GFR)  between 15-29 mL/min/1.73 square meter (HCC) 42/68/3419   Complication of anesthesia    delirious after Breast Surgery   Dementia ([place])    mild   Depression    Diabetes mellitus with ESRD (end-stage renal disease) ([place])    type II   Dysphagia    Dyspnea    with activity   GERD (gastroesophageal reflux disease)    Glaucoma    Hyperlipidemia    Hypertension    Multiple myeloma (HCC)    Pneumonia    Stage 1 infiltrating ductal carcinoma of right female breast ([place]) 08/21/2015   ER+ PR+ HER 2 neu + (3+) T1cN0     Past Surgical History:  Procedure Laterality Date   A/V FISTULAGRAM Right 07/05/2020   Procedure: A/V Fistulagram;  Surgeon: Elam Dutch, MD;  Location: [facility];  Service: Cardiovascular;  Laterality: Right;   AV FISTULA PLACEMENT Left 11/22/2017   Procedure: ARTERIOVENOUS (AV) FISTULA CREATION LEFT ARM;  Surgeon: Elam Dutch, MD;  Location: [facility] OR;  Service: Vascular;  Laterality: Left;   AV FISTULA PLACEMENT Right 04/04/2020   Procedure: RIGHT ARM ARTERIOVENOUS FISTULA CREATION;  Surgeon: Rosetta Posner, MD;  Location: [facility];  Service: Vascular;  Laterality: Right;   AV FISTULA PLACEMENT Right 08/20/2020   Procedure: ARTERIOVENOUS (AV) FISTULA LIGATION RIGHT ARM;  Surgeon: Elam Dutch, MD;  Location: [facility];  Service: Vascular;  Laterality: Right;   BIOPSY  08/07/2016   Procedure: BIOPSY;  Surgeon: Daneil Dolin, MD;  [facility];  Service: Endoscopy;;  gastric ulcer biopsy   COLONOSCOPY     ESOPHAGOGASTRODUODENOSCOPY N/A 08/07/2016   LA Grade A esophagitis s/p dilation, small hiatal hernia, multiple gastric ulcers and erosions, duodenal erosions s/p biopsy. Negative H.pylori    ESOPHAGOGASTRODUODENOSCOPY N/A 11/27/2016   normal esophagus, previously noted gastric ulcers completely healed, normal duodenum.    ESOPHAGOGASTRODUODENOSCOPY (EGD) WITH PROPOFOL N/A 07/23/2020   Procedure: ESOPHAGOGASTRODUODENOSCOPY (EGD) WITH PROPOFOL;   Surgeon: Rogene Houston, MD;  [facility];  Service: Endoscopy;  Laterality: N/A;   FISTULA SUPERFICIALIZATION Left 02/14/2018   Procedure: FISTULA SUPERFICIALIZATION LEFT ARM;  Surgeon: Angelia Mould, MD;  Location: [facility] OR;  Service: Vascular;  Laterality: Left;   FRACTURE SURGERY Right    ankle   HOT HEMOSTASIS  07/23/2020   Procedure: HOT HEMOSTASIS (ARGON PLASMA COAGULATION/BICAP);  Surgeon: Rogene Houston, MD;  [facility];  Service: Endoscopy;;   INTRAMEDULLARY (IM) NAIL INTERTROCHANTERIC Right 07/12/2020   Procedure: INTRAMEDULLARY (IM) NAIL INTERTROCHANTRIC;  Surgeon: Mordecai Rasmussen, MD;  [facility];  Service: Orthopedics;  Laterality: Right;   MALONEY DILATION N/A 08/07/2016   Procedure: Venia Minks DILATION;  Surgeon: Daneil Dolin, MD;  [facility];  Service: Endoscopy;  Laterality: N/A;   MASTECTOMY, PARTIAL Right    PERIPHERAL VASCULAR BALLOON ANGIOPLASTY Left 07/13/2019   Procedure: PERIPHERAL VASCULAR BALLOON ANGIOPLASTY;  Surgeon: Marty Heck, MD;  Location: [place];  Service: Cardiovascular;  Laterality: Left;  arm fistulogram   PERIPHERAL VASCULAR BALLOON ANGIOPLASTY Right 05/22/2020   Procedure: PERIPHERAL VASCULAR BALLOON ANGIOPLASTY;  Surgeon: Cherre Robins, MD;  Location: [facility];  Service: Cardiovascular;  Laterality: Right;  arm fistula   PERIPHERAL VASCULAR BALLOON ANGIOPLASTY Right 07/05/2020   Procedure: PERIPHERAL VASCULAR BALLOON ANGIOPLASTY;  Surgeon: Elam Dutch, MD;  Location: [facility];  Service: Cardiovascular;  Laterality: Right;  arm fistula   PORT-A-CATH REMOVAL Left 11/22/2017   Procedure: REMOVAL PORT-A-CATH LEFT CHEST;  Surgeon: Elam Dutch, MD;  Location: [place];  Service: Vascular;  Laterality: Left;   RETINAL DETACHMENT SURGERY Right    SCLEROTHERAPY  07/23/2020   Procedure: SCLEROTHERAPY;  Surgeon: Rogene Houston, MD;  [facility];   Service: Endoscopy;;   UPPER EXTREMITY VENOGRAPHY N/A 08/22/2020   Procedure: LEFT UPPER & CENTRAL VENOGRAPHY;  Surgeon: Marty Heck, MD;  Location: [place] [facility];  Service: Cardiovascular;  Laterality: N/A;    Social History   Socioeconomic History   Marital status: Single    Spouse name: Not on file   Number of children: Not on file   Years of education: Not on file   Highest education level: Not on file  Occupational History   Occupation: retired   Tobacco Use   Smoking status: Never   Smokeless tobacco: Never  Vaping Use   Vaping Use: Never used  Substance and Sexual Activity   Alcohol use: No    Alcohol/week: 0.0 standard drinks of alcohol   Drug use: No   Sexual activity: Never  Other Topics Concern   Not on file  Social History Narrative   Long term resident of SNF    Social Determinants of Health   Financial Resource Strain: [place]  (01/09/2020)   Overall Financial Resource Strain (CARDIA)    Difficulty of Paying Living Expenses: Not very hard  Food Insecurity: No Food Insecurity (01/09/2020)   Hunger Vital Sign    Worried About Running Out  of Food in the Last Year: Never true    [place] in the Last Year: Never true  Transportation Needs: No Transportation Needs (01/09/2020)   PRAPARE - Hydrologist (Medical): No    Lack of Transportation (Non-Medical): No  Physical Activity: Inactive (01/09/2020)   Exercise Vital Sign    Days of Exercise per Week: 0 days    Minutes of Exercise per Session: 0 min  Stress: No Stress Concern Present (01/09/2020)   [place]    Feeling of Stress : Not at all  Social Connections: Moderately Isolated (01/09/2020)   Social Connection and Isolation Panel [NHANES]    Frequency of Communication with Friends and Family: More than three times a week    Frequency of Social Gatherings with Friends and Family: Once a week     Attends Religious Services: More than 4 times per year    Active Member of [employer] or Organizations: No    Attends Archivist Meetings: Never    Marital Status: Never married  Intimate Partner Violence: Not At Risk (01/09/2020)   Humiliation, Afraid, Rape, and Kick questionnaire    Fear of Current or Ex-Partner: No    Emotionally Abused: No    Physically Abused: No    Sexually Abused: No   Family History  Problem Relation Age of Onset   Multiple myeloma Sister    Brain cancer Sister    Dementia Mother        died at 31   Stroke Mother    Heart failure Mother    Diabetes Mother    Heart disease Father    Prostate cancer Brother    Colon cancer Neg Hx       VITAL SIGNS BP (!) 148/62   Pulse 88   Temp (!) 97.3 F (36.3 C)   Resp 18   Ht 5' 3" (1.6 m)   Wt 142 lb 9.6 oz (64.7 kg)   SpO2 96%   BMI 25.26 kg/m   Outpatient Encounter Medications as of 01/02/2022  Medication Sig   acetaminophen (TYLENOL) 325 MG tablet Take 650 mg by mouth every 8 (eight) hours.   acyclovir (ZOVIRAX) 200 MG capsule Take 200 mg by mouth in the morning. (0800)   Amino Acids-Protein Hydrolys (FEEDING SUPPLEMENT, PRO-STAT SUGAR FREE 64,) LIQD Take 30 mLs by mouth 3 (three) times daily with meals. (0800, 1200 & 1800)   anastrozole (ARIMIDEX) 1 MG tablet TAKE 1 TABLET BY MOUTH DAILY   Calcium Acetate 667 MG TABS Take 2 tablets by mouth 3 (three) times daily with meals. 1 tablet daily w/ snack   calcium-vitamin D (OSCAL WITH D) 500-200 MG-UNIT tablet Take 1 tablet by mouth in the morning. (0800)   denosumab (PROLIA) 60 MG/ML SOSY injection Inject 60 mg into the skin every 6 (six) months.   docusate sodium (COLACE) 100 MG capsule Take 100 mg by mouth 2 (two) times daily.   Insulin Pen Needle (BD AUTOSHIELD DUO) 30G X 5 MM MISC by Does not apply route. 3/16"   melatonin 3 MG TABS tablet Take 6 mg by mouth at bedtime. (2000) For Sleep   multivitamin (RENA-VIT) TABS tablet Take 1 tablet by mouth  at bedtime. (2100)   ondansetron (ZOFRAN) 4 MG tablet Take 1 tablet (4 mg total) by mouth every 6 (six) hours as needed for nausea.   pantoprazole (PROTONIX) 40 MG tablet Take 40 mg by  mouth 2 (two) times daily.   sertraline (ZOLOFT) 25 MG tablet Take 25 mg by mouth daily. At 9 am along with 50 mg tablet for a total of 75 mg daily   sertraline (ZOLOFT) 50 MG tablet Take 50 mg by mouth daily. At 9 am along with 25 mg tablet for a total of 75 mg daily   sucralfate (CARAFATE) 1 GM/10ML suspension Take 1 g by mouth in the morning, at noon, in the evening, and at bedtime. (0800, 1200, 1700 & 2100)   Facility-Administered Encounter Medications as of 01/02/2022  Medication   lanreotide acetate (SOMATULINE DEPOT) 120 MG/0.5ML injection   lanreotide acetate (SOMATULINE DEPOT) 120 MG/0.5ML injection   octreotide (SANDOSTATIN LAR) 30 MG IM injection   octreotide (SANDOSTATIN LAR) 30 MG IM injection     SIGNIFICANT DIAGNOSTIC EXAMS  PREVIOUS   10-01-21: chest x-ray: No evidence of active cardiopulmonary process.   10-01-21: abdominal ultrasound:  1. Cholelithiasis with no evidence of acute cholecystitis. 2. Increased echogenicity of the liver parenchyma, findings suggestive of hepatic steatosis.  10-01-21: ct of chest abdomen and pelvis  No aspiration pneumonia. Mild focal scarring at the medial left lung base.   Coronary artery calcification. Aortic atherosclerotic calcification. Aortic Atherosclerosis  Probably acute superior endplate fractures at W10, L1 and L2. No retropulsed bone.   Renal atrophy. Multiple renal cysts. Indeterminate 2.7 cm lesion at the lower pole the right kidney could be a complicated cyst or a mass. This could be evaluated with renal MRI. 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 Nonobstructing 5 mm stone in the lower pole of the left kidney.   1 x 2 cm cystic abnormality in the ventral head of the pancreas. Best practice recommendation is for reimaging of this every 6  months for 2 years and then every 1 year for 2 years and then every 2 years for 6 years. Follow-up can be discontinued if stable for 10 years. This recommendation could be tailored to this patient's clinical status and life expectancy.  Paget's disease of the right hemipelvis. Diverticulosis without evidence of diverticulitis. Small hiatal hernia. Multiple calcified leiomyomas the uterus.  10-24-21: DEXA: t score -2.957  NO NEW EXAMS      LABS REVIEWED PREVIOUS   12-17-20: wbc 5.2; hgb 10.5; hct 33.8; mcv 98.8 plt 256; glucose 136; bun 43; creat 6.19; k+ 3.6; na++ 137; ca 8.9; GFR 6 liver normal albumin 3.9  03-06-21: wbc 4.7; hgb 12.3; hct 38.2; mcv 101.6 plt 221; glucose 205; bun 51; creat 7.06; k+ 4.5; na++ 133; ca 9.1; GFR 5; liver normal albumin 3.8  03-20-21: wbc 5.4; hgb 12.1; hct 37.7; mcv 97.2 plt 254; glucose 111; bun 47; creat 7.34; k+ 4.6; na++ 131; ca 9.6 GFR 5; liver normal albumin 3.9; mag 2.2 03-27-21: hgb a1c 6.1   04-29-21; wbc 5.4; hgb 11.0; hct 34.6;mcv 100.9 plt 179; glucose 82; bun 44; creat 7.04; k+ 4.6; na++ 139; ca 8.7; GFR 5 d-dimer: 1.25; CRP 4.0   05-27-21: wbc 6.0; hgb 10.6; hct 33.5; mcv 101.2 plt 261; glucose 147; bun 45; creat 6.86; k+ 4.5; na++ 138; ca 8.3; GFR 6; protein 6.9; albumin 3.8  06-12-21: wbc 6.5; hgb 8.8; hct 27.7; mcv 101.5 plt 229; glucose 153; bun 40; creat 6.53; k+ 4.3; na++ 136; ca 9.5; GFR 6 protein 6.5; albumin 3.4 mag 2.2  07-07-21: chol 171; ldl 89; trig 265; hdl 29  07-08-21: wbc 15.7; hgb 9.8; hct 32.8; mcv 104.8; plt 226; glucose 164; bun  51; creat 7.74; k+ 4.4; na++ 138; ca 9.7; gfr 5 protein 7.5; albumin 3.9 mag 2.1; chol 171; ldl 89; trig 265; hdl 29; hgb a1c 6.1 08-12-21: wbc 6.3; hgb 10.0; hct 31.9; mcv 103.9 plt 215; glucose 197; bun 40;creat 6.25; k+ 4.2; na++138; ca 9.3; gfr 6; protein 6.7; albumin 3.7; iron 93; tibc 142; ferritin 312 09-30-21: wbc 6.6; hgb 9.6; hct 31.1; mcv 101.3 plt 232; glucose 222; bun 40; creat 6.78; k+ 4.0; na++ 136;  ca 9.2; gfr 6; protein 6.8 albumin 3.7  10-01-21: wbc 13.0; hgb 9.1; hct 28.8; mcv 99.3 plt 251; glucose 198; bun 59; creat 9.01; k+ 3.7; na++ 135; ca 9.6; gfr 4; ast 212; alt 173; protein 6.7; albumin 3.6  10-03-21: wbc 7.2; hgb 8.6; hct 27.5; mcv 99.6 plt 179 glucose 105; bun 34; creat 7.04; k+ 3.6; na++ 135; ca 8.8; gfr 5 ast 45; alt 99; protein 6.5; albumin 3.5  10-10-21: hgb a1c 5.3  11-25-21: wbc 4.9; hgb 9.4; hct 30.7; mcv 100.7 plt 237; glucose 171; bun 27; creat 6.88; k+ 4.4; na++ 137; ca 9.0; gfr 6; protein 6.4 albumin 3.5  12-16-21: wbc 5.3; hgb 10.1; hct 33.0 mcv 98.5 plt 256; glucose 119; bun 24; creat 6.62; k+ 4.2 na++ 136; ca 9.3 gfr 6; protein 6.4 albumin 3.5; iron 54; tibc 140; ferritin 272  NO NEW LABS.     Review of Systems  Constitutional:  Negative for malaise/fatigue.  Respiratory:  Negative for cough and shortness of breath.   Cardiovascular:  Negative for chest pain, palpitations and leg swelling.  Gastrointestinal:  Negative for abdominal pain, constipation and heartburn.  Musculoskeletal:  Negative for back pain, joint pain and myalgias.  Skin: Negative.   Neurological:  Negative for dizziness.  Psychiatric/Behavioral:  The patient is not nervous/anxious.     Physical Exam Constitutional:      General: She is not in acute distress.    Appearance: She is well-developed. She is not diaphoretic.  Neck:     Thyroid: No thyromegaly.  Cardiovascular:     Rate and Rhythm: Normal rate and regular rhythm.     Pulses: Normal pulses.     Heart sounds: Normal heart sounds.  Pulmonary:     Effort: Pulmonary effort is normal. No respiratory distress.     Breath sounds: Normal breath sounds.  Chest:  Breasts:    Right: Absent.  Abdominal:     General: Bowel sounds are normal. There is no distension.     Palpations: Abdomen is soft.     Tenderness: There is no abdominal tenderness.  Musculoskeletal:        General: Normal range of motion.     Cervical back: Neck  supple.     Right lower leg: No edema.     Left lower leg: No edema.  Lymphadenopathy:     Cervical: No cervical adenopathy.  Skin:    General: Skin is warm and dry.     Comments: Chest wall dialysis access   Neurological:     Mental Status: She is alert. Mental status is at baseline.  Psychiatric:        Mood and Affect: Mood normal.       ASSESSMENT/ PLAN:  TODAY  Chronic kidney disease with end stage renal disease on hemodialysis due to type 2 diabetes  Vascular Dementia with behavioral disturbance  Malignant neoplasm of central portion of right breast in female estrogen receptor positive  Will continue current medications Will continue current plan of  care Will continue to monitor her status.    Ok Edwards NP [facility]  call [phone]

## 2022-01-05 DIAGNOSIS — N2581 Secondary hyperparathyroidism of renal origin: Secondary | ICD-10-CM | POA: Diagnosis not present

## 2022-01-05 DIAGNOSIS — N186 End stage renal disease: Secondary | ICD-10-CM | POA: Diagnosis not present

## 2022-01-05 DIAGNOSIS — R1312 Dysphagia, oropharyngeal phase: Secondary | ICD-10-CM | POA: Diagnosis not present

## 2022-01-05 DIAGNOSIS — Z992 Dependence on renal dialysis: Secondary | ICD-10-CM | POA: Diagnosis not present

## 2022-01-06 ENCOUNTER — Other Ambulatory Visit: Payer: Medicare PPO

## 2022-01-06 ENCOUNTER — Inpatient Hospital Stay: Payer: Medicare PPO

## 2022-01-06 ENCOUNTER — Ambulatory Visit: Payer: Medicare PPO

## 2022-01-06 DIAGNOSIS — Z992 Dependence on renal dialysis: Secondary | ICD-10-CM | POA: Diagnosis not present

## 2022-01-06 DIAGNOSIS — N186 End stage renal disease: Secondary | ICD-10-CM | POA: Diagnosis not present

## 2022-01-07 DIAGNOSIS — N2581 Secondary hyperparathyroidism of renal origin: Secondary | ICD-10-CM | POA: Diagnosis not present

## 2022-01-07 DIAGNOSIS — Z992 Dependence on renal dialysis: Secondary | ICD-10-CM | POA: Diagnosis not present

## 2022-01-07 DIAGNOSIS — N186 End stage renal disease: Secondary | ICD-10-CM | POA: Diagnosis not present

## 2022-01-08 ENCOUNTER — Other Ambulatory Visit: Payer: Self-pay

## 2022-01-08 DIAGNOSIS — R1312 Dysphagia, oropharyngeal phase: Secondary | ICD-10-CM | POA: Diagnosis not present

## 2022-01-09 DIAGNOSIS — N2581 Secondary hyperparathyroidism of renal origin: Secondary | ICD-10-CM | POA: Diagnosis not present

## 2022-01-09 DIAGNOSIS — Z992 Dependence on renal dialysis: Secondary | ICD-10-CM | POA: Diagnosis not present

## 2022-01-09 DIAGNOSIS — N186 End stage renal disease: Secondary | ICD-10-CM | POA: Diagnosis not present

## 2022-01-09 DIAGNOSIS — R1312 Dysphagia, oropharyngeal phase: Secondary | ICD-10-CM | POA: Diagnosis not present

## 2022-01-10 DIAGNOSIS — R1312 Dysphagia, oropharyngeal phase: Secondary | ICD-10-CM | POA: Diagnosis not present

## 2022-01-12 DIAGNOSIS — N2581 Secondary hyperparathyroidism of renal origin: Secondary | ICD-10-CM | POA: Diagnosis not present

## 2022-01-12 DIAGNOSIS — Z992 Dependence on renal dialysis: Secondary | ICD-10-CM | POA: Diagnosis not present

## 2022-01-12 DIAGNOSIS — N186 End stage renal disease: Secondary | ICD-10-CM | POA: Diagnosis not present

## 2022-01-13 DIAGNOSIS — R1312 Dysphagia, oropharyngeal phase: Secondary | ICD-10-CM | POA: Diagnosis not present

## 2022-01-14 DIAGNOSIS — Z992 Dependence on renal dialysis: Secondary | ICD-10-CM | POA: Diagnosis not present

## 2022-01-14 DIAGNOSIS — N2581 Secondary hyperparathyroidism of renal origin: Secondary | ICD-10-CM | POA: Diagnosis not present

## 2022-01-14 DIAGNOSIS — N186 End stage renal disease: Secondary | ICD-10-CM | POA: Diagnosis not present

## 2022-01-15 DIAGNOSIS — R1312 Dysphagia, oropharyngeal phase: Secondary | ICD-10-CM | POA: Diagnosis not present

## 2022-01-16 DIAGNOSIS — N2581 Secondary hyperparathyroidism of renal origin: Secondary | ICD-10-CM | POA: Diagnosis not present

## 2022-01-16 DIAGNOSIS — Z992 Dependence on renal dialysis: Secondary | ICD-10-CM | POA: Diagnosis not present

## 2022-01-16 DIAGNOSIS — N186 End stage renal disease: Secondary | ICD-10-CM | POA: Diagnosis not present

## 2022-01-19 DIAGNOSIS — N186 End stage renal disease: Secondary | ICD-10-CM | POA: Diagnosis not present

## 2022-01-19 DIAGNOSIS — Z992 Dependence on renal dialysis: Secondary | ICD-10-CM | POA: Diagnosis not present

## 2022-01-19 DIAGNOSIS — N2581 Secondary hyperparathyroidism of renal origin: Secondary | ICD-10-CM | POA: Diagnosis not present

## 2022-01-20 ENCOUNTER — Inpatient Hospital Stay: Payer: Medicare PPO | Attending: Hematology

## 2022-01-20 ENCOUNTER — Inpatient Hospital Stay: Payer: Medicare PPO

## 2022-01-20 VITALS — BP 142/80 | HR 92 | Temp 99.2°F | Resp 19

## 2022-01-20 DIAGNOSIS — C9000 Multiple myeloma not having achieved remission: Secondary | ICD-10-CM

## 2022-01-20 DIAGNOSIS — R1312 Dysphagia, oropharyngeal phase: Secondary | ICD-10-CM | POA: Diagnosis not present

## 2022-01-20 DIAGNOSIS — Z5112 Encounter for antineoplastic immunotherapy: Secondary | ICD-10-CM | POA: Diagnosis not present

## 2022-01-20 LAB — COMPREHENSIVE METABOLIC PANEL
ALT: 10 U/L (ref 0–44)
AST: 16 U/L (ref 15–41)
Albumin: 3.7 g/dL (ref 3.5–5.0)
Alkaline Phosphatase: 56 U/L (ref 38–126)
Anion gap: 11 (ref 5–15)
BUN: 28 mg/dL — ABNORMAL HIGH (ref 8–23)
CO2: 22 mmol/L (ref 22–32)
Calcium: 7.9 mg/dL — ABNORMAL LOW (ref 8.9–10.3)
Chloride: 101 mmol/L (ref 98–111)
Creatinine, Ser: 7.58 mg/dL — ABNORMAL HIGH (ref 0.44–1.00)
GFR, Estimated: 5 mL/min — ABNORMAL LOW (ref >60)
Glucose, Bld: 194 mg/dL — ABNORMAL HIGH (ref 70–99)
Potassium: 4.7 mmol/L (ref 3.5–5.1)
Sodium: 134 mmol/L — ABNORMAL LOW (ref 135–145)
Total Bilirubin: 0.5 mg/dL (ref 0.3–1.2)
Total Protein: 6.7 g/dL (ref 6.5–8.1)

## 2022-01-20 LAB — CBC WITH DIFFERENTIAL/PLATELET
Abs Immature Granulocytes: 0.01 10*3/uL (ref 0.00–0.07)
Basophils Absolute: 0 10*3/uL (ref 0.0–0.1)
Basophils Relative: 1 %
Eosinophils Absolute: 0.2 10*3/uL (ref 0.0–0.5)
Eosinophils Relative: 5 %
HCT: 39.2 % (ref 36.0–46.0)
Hemoglobin: 11.9 g/dL — ABNORMAL LOW (ref 12.0–15.0)
Immature Granulocytes: 0 %
Lymphocytes Relative: 20 %
Lymphs Abs: 0.8 10*3/uL (ref 0.7–4.0)
MCH: 29.2 pg (ref 26.0–34.0)
MCHC: 30.4 g/dL (ref 30.0–36.0)
MCV: 96.1 fL (ref 80.0–100.0)
Monocytes Absolute: 0.4 10*3/uL (ref 0.1–1.0)
Monocytes Relative: 10 %
Neutro Abs: 2.6 10*3/uL (ref 1.7–7.7)
Neutrophils Relative %: 64 %
Platelets: 231 10*3/uL (ref 150–400)
RBC: 4.08 MIL/uL (ref 3.87–5.11)
RDW: 16.5 % — ABNORMAL HIGH (ref 11.5–15.5)
WBC: 4 10*3/uL (ref 4.0–10.5)
nRBC: 0 % (ref 0.0–0.2)

## 2022-01-20 LAB — MAGNESIUM: Magnesium: 2.2 mg/dL (ref 1.7–2.4)

## 2022-01-20 MED ORDER — PROCHLORPERAZINE MALEATE 10 MG PO TABS
10.0000 mg | ORAL_TABLET | Freq: Once | ORAL | Status: AC
  Administered 2022-01-20: 10 mg via ORAL
  Filled 2022-01-20: qty 1

## 2022-01-20 MED ORDER — BORTEZOMIB CHEMO SQ INJECTION 3.5 MG (2.5MG/ML)
1.2000 mg/m2 | Freq: Once | INTRAMUSCULAR | Status: AC
  Administered 2022-01-20: 2 mg via SUBCUTANEOUS
  Filled 2022-01-20: qty 0.8

## 2022-01-20 NOTE — Progress Notes (Signed)
Disregard elevated creatinine - patient actively on dialysis per Dr Delton Coombes.  Dose bortezomib confirmed at 1.2 mg/m2 with Dr Delton Coombes.  Henreitta Leber, PharmD

## 2022-01-20 NOTE — Patient Instructions (Signed)
[facility]  Discharge Instructions: Thank you for choosing [facility] to provide your oncology and hematology care.  If you have a lab appointment with the [facility], please come in thru the Main Entrance and check in at the main information desk.  Wear comfortable clothing and clothing appropriate for easy access to any Portacath or PICC line.   We strive to give you quality time with your provider. You may need to reschedule your appointment if you arrive late (15 or more minutes).  Arriving late affects you and other patients whose appointments are after yours.  Also, if you miss three or more appointments without notifying the office, you may be dismissed from the clinic at the provider's discretion.      For prescription refill requests, have your pharmacy contact our office and allow 72 hours for refills to be completed.    Today you received the following chemotherapy and/or immunotherapy agents Velcade, return as scheduled.   To help prevent nausea and vomiting after your treatment, we encourage you to take your nausea medication as directed.  BELOW ARE SYMPTOMS THAT SHOULD BE REPORTED IMMEDIATELY: *FEVER GREATER THAN 100.4 F (38 C) OR HIGHER *CHILLS OR SWEATING *NAUSEA AND VOMITING THAT IS NOT CONTROLLED WITH YOUR NAUSEA MEDICATION *UNUSUAL SHORTNESS OF BREATH *UNUSUAL BRUISING OR BLEEDING *URINARY PROBLEMS (pain or burning when urinating, or frequent urination) *BOWEL PROBLEMS (unusual diarrhea, constipation, pain near the anus) TENDERNESS IN MOUTH AND THROAT WITH OR WITHOUT PRESENCE OF ULCERS (sore throat, sores in mouth, or a toothache) UNUSUAL RASH, SWELLING OR PAIN  UNUSUAL VAGINAL DISCHARGE OR ITCHING   Items with * indicate a potential emergency and should be followed up as soon as possible or go to the Emergency Department if any problems should occur.  Please show the CHEMOTHERAPY ALERT CARD or IMMUNOTHERAPY ALERT CARD at  check-in to the Emergency Department and triage nurse.  Should you have questions after your visit or need to cancel or reschedule your appointment, please contact [facility] [phone]  and follow the prompts.  Office hours are 8:00 a.m. to 4:30 p.m. Monday - Friday. Please note that voicemails left after 4:00 p.m. may not be returned until the following business day.  We are closed weekends and major holidays. You have access to a nurse at all times for urgent questions. Please call the main number to the clinic [phone] and follow the prompts.  For any non-urgent questions, you may also contact your provider using MyChart. We now offer e-Visits for anyone 59 and older to request care online for non-urgent symptoms. For details visit [email or web address].   Also download the MyChart app! Go to the app store, search "MyChart", open the app, select [facility], and log in with your MyChart username and password.  Masks are optional in the cancer centers. If you would like for your care team to wear a mask while they are taking care of you, please let them know. You may have one support person who is at least 84 years old accompany you for your appointments.

## 2022-01-20 NOTE — Progress Notes (Signed)
Patient tolerated Velcade injection with no complaints voiced. Lab work reviewed. See MAR for details. Injection site clean and dry with no bruising or swelling noted. Patient stable during and after injection. Band aid applied. VSS. Patient left in satisfactory condition with no s/s of distress noted.

## 2022-01-21 DIAGNOSIS — N2581 Secondary hyperparathyroidism of renal origin: Secondary | ICD-10-CM | POA: Diagnosis not present

## 2022-01-21 DIAGNOSIS — N186 End stage renal disease: Secondary | ICD-10-CM | POA: Diagnosis not present

## 2022-01-21 DIAGNOSIS — Z992 Dependence on renal dialysis: Secondary | ICD-10-CM | POA: Diagnosis not present

## 2022-01-23 DIAGNOSIS — Z992 Dependence on renal dialysis: Secondary | ICD-10-CM | POA: Diagnosis not present

## 2022-01-23 DIAGNOSIS — N2581 Secondary hyperparathyroidism of renal origin: Secondary | ICD-10-CM | POA: Diagnosis not present

## 2022-01-23 DIAGNOSIS — N186 End stage renal disease: Secondary | ICD-10-CM | POA: Diagnosis not present

## 2022-01-26 DIAGNOSIS — Z992 Dependence on renal dialysis: Secondary | ICD-10-CM | POA: Diagnosis not present

## 2022-01-26 DIAGNOSIS — N186 End stage renal disease: Secondary | ICD-10-CM | POA: Diagnosis not present

## 2022-01-26 DIAGNOSIS — N2581 Secondary hyperparathyroidism of renal origin: Secondary | ICD-10-CM | POA: Diagnosis not present

## 2022-01-27 ENCOUNTER — Non-Acute Institutional Stay (SKILLED_NURSING_FACILITY): Payer: Medicare PPO | Admitting: Internal Medicine

## 2022-01-27 DIAGNOSIS — D631 Anemia in chronic kidney disease: Secondary | ICD-10-CM | POA: Diagnosis not present

## 2022-01-27 DIAGNOSIS — C9000 Multiple myeloma not having achieved remission: Secondary | ICD-10-CM | POA: Diagnosis not present

## 2022-01-27 DIAGNOSIS — I120 Hypertensive chronic kidney disease with stage 5 chronic kidney disease or end stage renal disease: Secondary | ICD-10-CM | POA: Diagnosis not present

## 2022-01-27 DIAGNOSIS — C50111 Malignant neoplasm of central portion of right female breast: Secondary | ICD-10-CM | POA: Diagnosis not present

## 2022-01-27 DIAGNOSIS — F01518 Vascular dementia, unspecified severity, with other behavioral disturbance: Secondary | ICD-10-CM | POA: Diagnosis not present

## 2022-01-27 DIAGNOSIS — Z992 Dependence on renal dialysis: Secondary | ICD-10-CM

## 2022-01-27 DIAGNOSIS — Z170 Estrogen receptor positive status [ER+]: Secondary | ICD-10-CM

## 2022-01-27 DIAGNOSIS — E1122 Type 2 diabetes mellitus with diabetic chronic kidney disease: Secondary | ICD-10-CM | POA: Diagnosis not present

## 2022-01-27 DIAGNOSIS — N186 End stage renal disease: Secondary | ICD-10-CM

## 2022-01-27 NOTE — Progress Notes (Unsigned)
NURSING HOME LOCATION:  [facility] ROOM NUMBER:  [id number]  CODE STATUS:  DNR  PCP:  Carly Innocent NP  This is a nursing facility follow up visit of chronic medical diagnoses & to document compliance with Regulation 483.30 (c) in The Long Term Care Survey Manual Phase 2 which mandates caregiver visit ( visits can alternate among physician, PA or NP as per statutes) within 10 days of 30 days / 60 days/ 90 days post admission to SNF date    Interim medical record and care since last SNF visit was updated with review of diagnostic studies and change in clinical status since last visit were documented.  HPI: She is a permanent resident of this facility with medical diagnoses of chronic anemia, diabetes complicated by CKD stage IV, chronic depression, GERD, glaucoma, dyslipidemia, essential hypertension, breast cancer, and multiple myeloma.  She is receiving infusions of Velcade for the multiple myeloma which is not in remission. Remains on Arimidex. She has had a partial right mastectomy as well as numerous peripheral vascular interventions.  Additionally she has had EGD and colonoscopy.  Labs are current and reveal minimal hyponatremia with a value 134.  Recorded glucoses range from 88 up to 213.  Most recent A1c on record was 5.3% on 10/10/2021. Creatinine 7.61 and GFR 5.  Anemia has improved with hemoglobin of 9.7 and hematocrit of 31.2, up from prior values of 8.6/27.5.  Mild macrocytosis is present with an MCV of 100.6.  Review of systems: Dementia invalidated responses.  She has met me on numerous occasions but asked my name after I introduced myself.  She continues to complain of soreness in her knees.  She describes occasional vomiting at breakfast.  She describes some numbness in her fingers.  At that point she asked me "how are the [place] here?"  This is despite that she has been a lifelong resident of this area.  Constitutional: No fever, significant weight change, fatigue   Eyes: No redness, discharge, pain, vision change ENT/mouth: No nasal congestion,  purulent discharge, earache, change in hearing, sore throat  Cardiovascular: No chest pain, palpitations, paroxysmal nocturnal dyspnea, claudication, edema  Respiratory: No cough, sputum production, hemoptysis, DOE, significant snoring, apnea   Gastrointestinal: No heartburn, dysphagia, abdominal pain,rectal bleeding, melena, change in bowels Genitourinary: No dysuria, hematuria, pyuria, incontinence, nocturia Dermatologic: No rash, pruritus, change in appearance of skin Neurologic: No dizziness, headache, syncope, seizures Psychiatric: No significant anxiety, depression, insomnia, anorexia Endocrine: No change in hair/skin/nails, excessive thirst, excessive hunger, excessive urination  Hematologic/lymphatic: No significant bruising, lymphadenopathy, abnormal bleeding Allergy/immunology: No itchy/watery eyes, significant sneezing, urticaria, angioedema  Physical exam:  Pertinent or positive findings: Heart rhythm is slightly irregular.  First and second heart sounds are increased.  Breath sounds are decreased.  Abdomen is protuberant.  She has nonpitting edema of the ankles.  Pedal pulses are decreased.  There are fusiform changes the knees without suggestion of effusions.  She does exhibit tremor of her hands.  General appearance: Adequately nourished; no acute distress, increased work of breathing is present.   Lymphatic: No lymphadenopathy about the head, neck, axilla. Eyes: No conjunctival inflammation or lid edema is present. There is no scleral icterus. Ears:  External ear exam shows no significant lesions or deformities.   Nose:  External nasal examination shows no deformity or inflammation. Nasal mucosa are pink and moist without lesions, exudates Oral exam:  Lips and gums are healthy appearing. There is no oropharyngeal erythema or exudate. Neck:  No  thyromegaly, masses, tenderness noted.    Heart:  No  gallop, murmur, click, rub .  Lungs: without wheezes, rhonchi, rales, rubs. Abdomen: Bowel sounds are normal. Abdomen is soft and nontender with no organomegaly, hernias, masses. GU: Deferred  Extremities:  No cyanosis, clubbing  Neurologic exam :Balance, Rhomberg, finger to nose testing could not be completed due to clinical state Skin: Warm & dry w/o tenting. No significant lesions or rash.  See summary under each active problem in the Problem List with associated updated therapeutic plan

## 2022-01-28 ENCOUNTER — Encounter: Payer: Self-pay | Admitting: Internal Medicine

## 2022-01-28 DIAGNOSIS — N2581 Secondary hyperparathyroidism of renal origin: Secondary | ICD-10-CM | POA: Diagnosis not present

## 2022-01-28 DIAGNOSIS — Z992 Dependence on renal dialysis: Secondary | ICD-10-CM | POA: Diagnosis not present

## 2022-01-28 DIAGNOSIS — N186 End stage renal disease: Secondary | ICD-10-CM | POA: Diagnosis not present

## 2022-01-28 NOTE — Patient Instructions (Signed)
See assessment and plan under each diagnosis in the problem list and acutely for this visit 

## 2022-01-28 NOTE — Assessment & Plan Note (Signed)
ESRD necessitating dialysis present as current creatinine 7.61 and GFR of 5.  This is associated with chronic anemia and glucose discordance.  Despite a A1c of 5.3%; glucoses ranged from 88 up to 213.

## 2022-01-28 NOTE — Assessment & Plan Note (Signed)
See comments under diabetes mellitus complicated by ESRD.  Med list reviewed; no change indicated.

## 2022-01-28 NOTE — Assessment & Plan Note (Signed)
No behavioral issues reported by staff.  She can provide no meaningful history.  Despite living in the area her entire life; she asked me what the winters were like here.

## 2022-01-29 NOTE — Assessment & Plan Note (Signed)
Currently on Arimidex

## 2022-01-29 NOTE — Assessment & Plan Note (Signed)
H/H improved : 9.7/31.2. No bleeding dyscrasias reported.

## 2022-01-29 NOTE — Assessment & Plan Note (Signed)
Velcade /Decadron chemotherapy w/o AE

## 2022-01-30 DIAGNOSIS — Z992 Dependence on renal dialysis: Secondary | ICD-10-CM | POA: Diagnosis not present

## 2022-01-30 DIAGNOSIS — N186 End stage renal disease: Secondary | ICD-10-CM | POA: Diagnosis not present

## 2022-01-30 DIAGNOSIS — N2581 Secondary hyperparathyroidism of renal origin: Secondary | ICD-10-CM | POA: Diagnosis not present

## 2022-02-02 ENCOUNTER — Other Ambulatory Visit: Payer: Self-pay

## 2022-02-02 DIAGNOSIS — N2581 Secondary hyperparathyroidism of renal origin: Secondary | ICD-10-CM | POA: Diagnosis not present

## 2022-02-02 DIAGNOSIS — Z992 Dependence on renal dialysis: Secondary | ICD-10-CM | POA: Diagnosis not present

## 2022-02-02 DIAGNOSIS — C9000 Multiple myeloma not having achieved remission: Secondary | ICD-10-CM

## 2022-02-02 DIAGNOSIS — N186 End stage renal disease: Secondary | ICD-10-CM | POA: Diagnosis not present

## 2022-02-02 IMAGING — DX DG TIBIA/FIBULA 2V*L*
2 series · 2 of 2 positions shown · non-contrast
Comparison: None.

CLINICAL DATA: 83-year-old female with a history of fall and left
leg pain

EXAM:
LEFT TIBIA AND FIBULA - 2 VIEW

[tibia ap (1 of 2)]
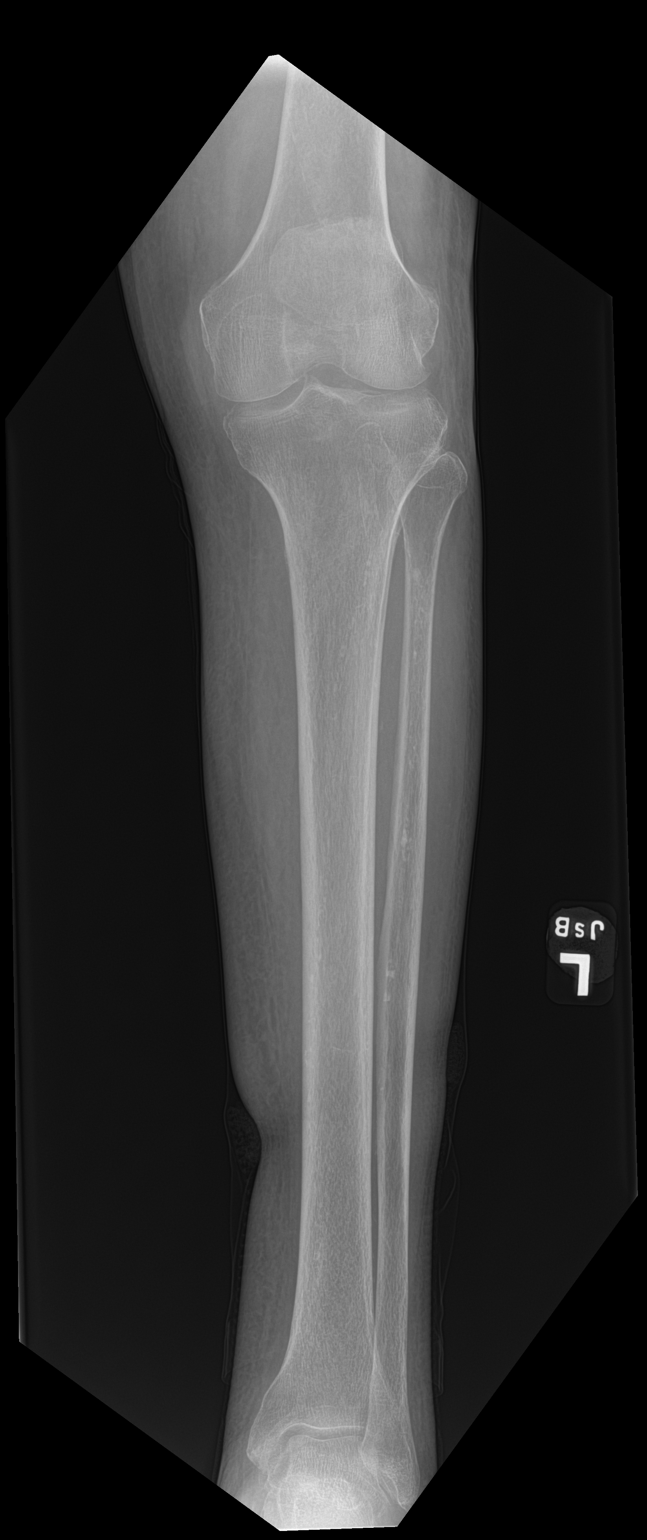

[tibia ap (2 of 2)]
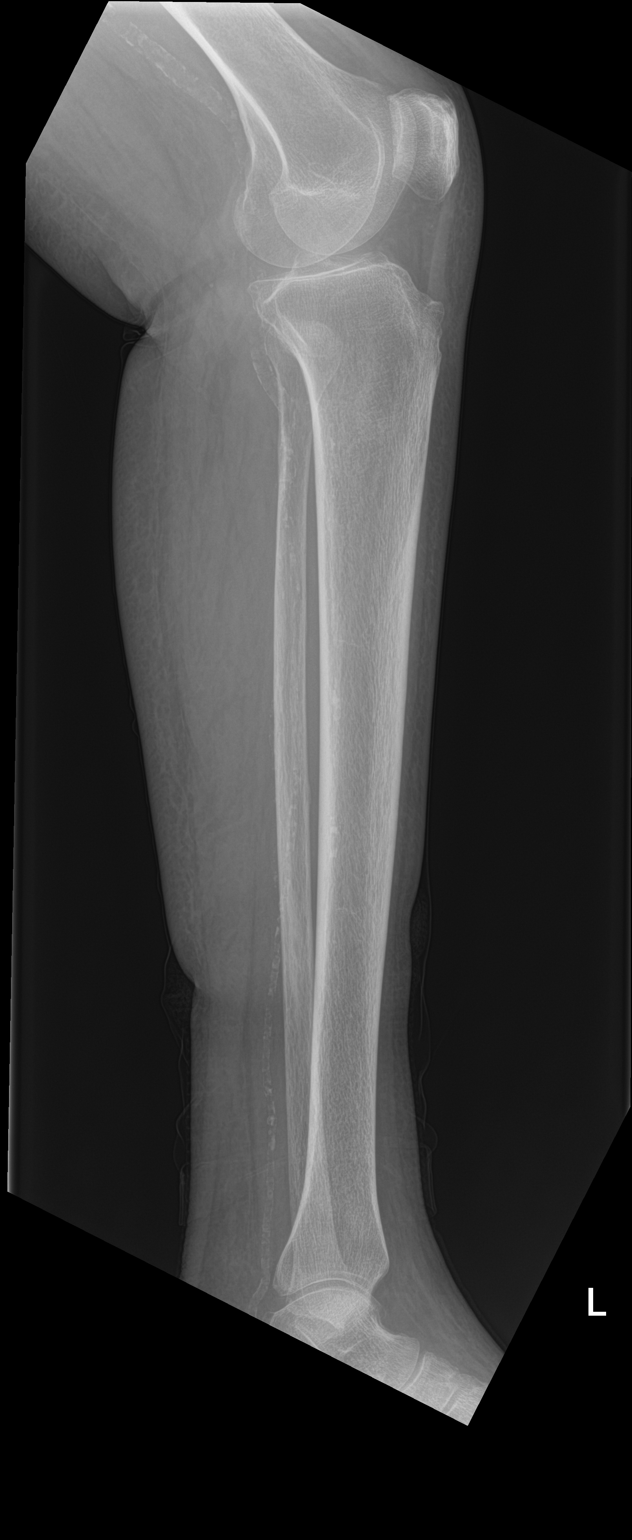

[2 of 2 positions shown; findings below may reference images not displayed]

FINDINGS: No acute displaced fracture. No focal soft tissue swelling. Mild
soft tissue swelling throughout the left lower extremity. No
radiopaque foreign body. Vascular calcifications.
IMPRESSION: Negative for acute bony abnormality.

## 2022-02-03 ENCOUNTER — Inpatient Hospital Stay: Payer: Medicare PPO

## 2022-02-03 VITALS — BP 146/81 | HR 100 | Temp 98.2°F | Resp 19 | Wt 132.8 lb

## 2022-02-03 DIAGNOSIS — C9000 Multiple myeloma not having achieved remission: Secondary | ICD-10-CM | POA: Diagnosis not present

## 2022-02-03 DIAGNOSIS — Z5112 Encounter for antineoplastic immunotherapy: Secondary | ICD-10-CM | POA: Diagnosis not present

## 2022-02-03 LAB — CBC WITH DIFFERENTIAL/PLATELET
Abs Immature Granulocytes: 0.01 10*3/uL (ref 0.00–0.07)
Basophils Absolute: 0 10*3/uL (ref 0.0–0.1)
Basophils Relative: 1 %
Eosinophils Absolute: 0.2 10*3/uL (ref 0.0–0.5)
Eosinophils Relative: 4 %
HCT: 37.6 % (ref 36.0–46.0)
Hemoglobin: 11.5 g/dL — ABNORMAL LOW (ref 12.0–15.0)
Immature Granulocytes: 0 %
Lymphocytes Relative: 23 %
Lymphs Abs: 0.9 10*3/uL (ref 0.7–4.0)
MCH: 29.5 pg (ref 26.0–34.0)
MCHC: 30.6 g/dL (ref 30.0–36.0)
MCV: 96.4 fL (ref 80.0–100.0)
Monocytes Absolute: 0.4 10*3/uL (ref 0.1–1.0)
Monocytes Relative: 11 %
Neutro Abs: 2.4 10*3/uL (ref 1.7–7.7)
Neutrophils Relative %: 61 %
Platelets: 212 10*3/uL (ref 150–400)
RBC: 3.9 MIL/uL (ref 3.87–5.11)
RDW: 16.4 % — ABNORMAL HIGH (ref 11.5–15.5)
WBC: 3.8 10*3/uL — ABNORMAL LOW (ref 4.0–10.5)
nRBC: 0 % (ref 0.0–0.2)

## 2022-02-03 LAB — COMPREHENSIVE METABOLIC PANEL
ALT: 10 U/L (ref 0–44)
AST: 20 U/L (ref 15–41)
Albumin: 3.7 g/dL (ref 3.5–5.0)
Alkaline Phosphatase: 51 U/L (ref 38–126)
Anion gap: 12 (ref 5–15)
BUN: 28 mg/dL — ABNORMAL HIGH (ref 8–23)
CO2: 28 mmol/L (ref 22–32)
Calcium: 8.3 mg/dL — ABNORMAL LOW (ref 8.9–10.3)
Chloride: 99 mmol/L (ref 98–111)
Creatinine, Ser: 7.99 mg/dL — ABNORMAL HIGH (ref 0.44–1.00)
GFR, Estimated: 5 mL/min — ABNORMAL LOW (ref >60)
Glucose, Bld: 164 mg/dL — ABNORMAL HIGH (ref 70–99)
Potassium: 4.4 mmol/L (ref 3.5–5.1)
Sodium: 139 mmol/L (ref 135–145)
Total Bilirubin: 0.3 mg/dL (ref 0.3–1.2)
Total Protein: 6.7 g/dL (ref 6.5–8.1)

## 2022-02-03 LAB — MAGNESIUM: Magnesium: 2.1 mg/dL (ref 1.7–2.4)

## 2022-02-03 MED ORDER — BORTEZOMIB CHEMO SQ INJECTION 3.5 MG (2.5MG/ML)
1.2000 mg/m2 | Freq: Once | INTRAMUSCULAR | Status: AC
  Administered 2022-02-03: 2 mg via SUBCUTANEOUS
  Filled 2022-02-03: qty 0.8

## 2022-02-03 MED ORDER — PROCHLORPERAZINE MALEATE 10 MG PO TABS
10.0000 mg | ORAL_TABLET | Freq: Once | ORAL | Status: AC
  Administered 2022-02-03: 10 mg via ORAL
  Filled 2022-02-03: qty 1

## 2022-02-03 NOTE — Progress Notes (Signed)
Patient presents today for treatment. Creatinine 7.99. Disregard value patient is on dialysis. Patient has no complaints of any changes since her last treatment. Vital signs and labs within parameters for treatment.   Treatment given today per MD orders. Tolerated without adverse affects. Vital signs stable. No complaints at this time. Discharged from clinic by wheel chair accompanied by staff from the [facility] in stable condition. Alert and oriented x 3. F/U with [facility] as scheduled.

## 2022-02-03 NOTE — Patient Instructions (Signed)
[facility]  Discharge Instructions: Thank you for choosing [facility] to provide your oncology and hematology care.  If you have a lab appointment with the Cancer Center, please come in thru the Main Entrance and check in at the main information desk.  Wear comfortable clothing and clothing appropriate for easy access to any Portacath or PICC line.   We strive to give you quality time with your provider. You may need to reschedule your appointment if you arrive late (15 or more minutes).  Arriving late affects you and other patients whose appointments are after yours.  Also, if you miss three or more appointments without notifying the office, you may be dismissed from the clinic at the provider's discretion.      For prescription refill requests, have your pharmacy contact our office and allow 72 hours for refills to be completed.    Today you received the following chemotherapy and/or immunotherapy agents Velcade.  Bortezomib Injection What is this medication? BORTEZOMIB (bor TEZ oh mib) treats lymphoma. It may also be used to treat multiple myeloma, a type of bone marrow cancer. It works by blocking a protein that causes cancer cells to grow and multiply. This helps to slow or stop the spread of cancer cells. This medicine may be used for other purposes; ask your health care provider or pharmacist if you have questions. COMMON BRAND NAME(S): Velcade What should I tell my care team before I take this medication? They need to know if you have any of these conditions: Dehydration Diabetes Heart disease Liver disease Tingling of the fingers or toes or other nerve disorder An unusual or allergic reaction to bortezomib, other medications, foods, dyes, or preservatives If you or your partner are pregnant or trying to get pregnant Breastfeeding How should I use this medication? This medication is injected into a vein or under the skin. It is given by your  care team in a hospital or clinic setting. Talk to your care team about the use of this medication in children. Special care may be needed. Overdosage: If you think you have taken too much of this medicine contact a poison control center or emergency room at once. NOTE: This medicine is only for you. Do not share this medicine with others. What if I miss a dose? Keep appointments for follow-up doses. It is important not to miss your dose. Call your care team if you are unable to keep an appointment. What may interact with this medication? Ketoconazole Rifampin This list may not describe all possible interactions. Give your health care provider a list of all the medicines, herbs, non-prescription drugs, or dietary supplements you use. Also tell them if you smoke, drink alcohol, or use illegal drugs. Some items may interact with your medicine. What should I watch for while using this medication? Your condition will be monitored carefully while you are receiving this medication. You may need blood work while taking this medication. This medication may affect your coordination, reaction time, or judgment. Do not drive or operate machinery until you know how this medication affects you. Sit up or stand slowly to reduce the risk of dizzy or fainting spells. Drinking alcohol with this medication can increase the risk of these side effects. This medication may increase your risk of getting an infection. Call your care team for advice if you get a fever, chills, sore throat, or other symptoms of a cold or flu. Do not treat yourself. Try to avoid being around   people who are sick. Check with your care team if you have severe diarrhea, nausea, and vomiting, or if you sweat a lot. The loss of too much body fluid may make it dangerous for you to take this medication. Talk to your care team if you may be pregnant. Serious birth defects can occur if you take this medication during pregnancy and for 7 months after the  last dose. You will need a negative pregnancy test before starting this medication. Contraception is recommended while taking this medication and for 7 months after the last dose. Your care team can help you find the option that works for you. If your partner can get pregnant, use a condom during sex while taking this medication and for 4 months after the last dose. Do not breastfeed while taking this medication and for 2 months after the last dose. This medication may cause infertility. Talk to your care team if you are concerned about your fertility. What side effects may I notice from receiving this medication? Side effects that you should report to your care team as soon as possible: Allergic reactions--skin rash, itching, hives, swelling of the face, lips, tongue, or throat Bleeding--bloody or black, tar-like stools, vomiting blood or brown material that looks like coffee grounds, red or dark brown urine, small red or purple spots on skin, unusual bruising or bleeding Bleeding in the brain--severe headache, stiff neck, confusion, dizziness, change in vision, numbness or weakness of the face, arm, or leg, trouble speaking, trouble walking, vomiting Bowel blockage--stomach cramping, unable to have a bowel movement or pass gas, loss of appetite, vomiting Heart failure--shortness of breath, swelling of the ankles, feet, or hands, sudden weight gain, unusual weakness or fatigue Infection--fever, chills, cough, sore throat, wounds that don't heal, pain or trouble when passing urine, general feeling of discomfort or being unwell Liver injury--right upper belly pain, loss of appetite, nausea, light-colored stool, dark yellow or brown urine, yellowing skin or eyes, unusual weakness or fatigue Low blood pressure--dizziness, feeling faint or lightheaded, blurry vision Lung injury--shortness of breath or trouble breathing, cough, spitting up blood, chest pain, fever Pain, tingling, or numbness in the hands  or feet Severe or prolonged diarrhea Stomach pain, bloody diarrhea, pale skin, unusual weakness or fatigue, decrease in the amount of urine, which may be signs of hemolytic uremic syndrome Sudden and severe headache, confusion, change in vision, seizures, which may be signs of posterior reversible encephalopathy syndrome (PRES) TTP--purple spots on the skin or inside the mouth, pale skin, yellowing skin or eyes, unusual weakness or fatigue, fever, fast or irregular heartbeat, confusion, change in vision, trouble speaking, trouble walking Tumor lysis syndrome (TLS)--nausea, vomiting, diarrhea, decrease in the amount of urine, dark urine, unusual weakness or fatigue, confusion, muscle pain or cramps, fast or irregular heartbeat, joint pain Side effects that usually do not require medical attention (report to your care team if they continue or are bothersome): Constipation Diarrhea Fatigue Loss of appetite Nausea This list may not describe all possible side effects. Call your doctor for medical advice about side effects. You may report side effects to FDA at 1-800-FDA-1088. Where should I keep my medication? This medication is given in a hospital or clinic. It will not be stored at home. NOTE: This sheet is a summary. It may not cover all possible information. If you have questions about this medicine, talk to your doctor, pharmacist, or health care provider.  2023 Elsevier/Gold Standard (2021-07-23 00:00:00)        To help   prevent nausea and vomiting after your treatment, we encourage you to take your nausea medication as directed.  BELOW ARE SYMPTOMS THAT SHOULD BE REPORTED IMMEDIATELY: *FEVER GREATER THAN 100.4 F (38 C) OR HIGHER *CHILLS OR SWEATING *NAUSEA AND VOMITING THAT IS NOT CONTROLLED WITH YOUR NAUSEA MEDICATION *UNUSUAL SHORTNESS OF BREATH *UNUSUAL BRUISING OR BLEEDING *URINARY PROBLEMS (pain or burning when urinating, or frequent urination) *BOWEL PROBLEMS (unusual diarrhea,  constipation, pain near the anus) TENDERNESS IN MOUTH AND THROAT WITH OR WITHOUT PRESENCE OF ULCERS (sore throat, sores in mouth, or a toothache) UNUSUAL RASH, SWELLING OR PAIN  UNUSUAL VAGINAL DISCHARGE OR ITCHING   Items with * indicate a potential emergency and should be followed up as soon as possible or go to the Emergency Department if any problems should occur.  Please show the CHEMOTHERAPY ALERT CARD or IMMUNOTHERAPY ALERT CARD at check-in to the Emergency Department and triage nurse.  Should you have questions after your visit or need to cancel or reschedule your appointment, please contact [facility] [phone]  and follow the prompts.  Office hours are 8:00 a.m. to 4:30 p.m. Monday - Friday. Please note that voicemails left after 4:00 p.m. may not be returned until the following business day.  We are closed weekends and major holidays. You have access to a nurse at all times for urgent questions. Please call the main number to the clinic [phone] and follow the prompts.  For any non-urgent questions, you may also contact your provider using MyChart. We now offer e-Visits for anyone 18 and older to request care online for non-urgent symptoms. For details visit [email or web address].   Also download the MyChart app! Go to the app store, search "MyChart", open the app, select [facility], and log in with your MyChart username and password.  Masks are optional in the cancer centers. If you would like for your care team to wear a mask while they are taking care of you, please let them know. You may have one support person who is at least 84 years old accompany you for your appointments.  

## 2022-02-04 DIAGNOSIS — N2581 Secondary hyperparathyroidism of renal origin: Secondary | ICD-10-CM | POA: Diagnosis not present

## 2022-02-04 DIAGNOSIS — Z992 Dependence on renal dialysis: Secondary | ICD-10-CM | POA: Diagnosis not present

## 2022-02-04 DIAGNOSIS — N186 End stage renal disease: Secondary | ICD-10-CM | POA: Diagnosis not present

## 2022-02-04 MED ORDER — LANREOTIDE ACETATE 120 MG/0.5ML ~~LOC~~ SOLN
SUBCUTANEOUS | Status: AC
  Filled 2022-02-04: qty 120

## 2022-02-05 DIAGNOSIS — Z992 Dependence on renal dialysis: Secondary | ICD-10-CM | POA: Diagnosis not present

## 2022-02-05 DIAGNOSIS — N186 End stage renal disease: Secondary | ICD-10-CM | POA: Diagnosis not present

## 2022-02-06 DIAGNOSIS — N2581 Secondary hyperparathyroidism of renal origin: Secondary | ICD-10-CM | POA: Diagnosis not present

## 2022-02-06 DIAGNOSIS — N250 Renal osteodystrophy: Secondary | ICD-10-CM | POA: Diagnosis not present

## 2022-02-06 DIAGNOSIS — N186 End stage renal disease: Secondary | ICD-10-CM | POA: Diagnosis not present

## 2022-02-06 DIAGNOSIS — Z992 Dependence on renal dialysis: Secondary | ICD-10-CM | POA: Diagnosis not present

## 2022-02-09 DIAGNOSIS — N2581 Secondary hyperparathyroidism of renal origin: Secondary | ICD-10-CM | POA: Diagnosis not present

## 2022-02-09 DIAGNOSIS — N186 End stage renal disease: Secondary | ICD-10-CM | POA: Diagnosis not present

## 2022-02-09 DIAGNOSIS — N250 Renal osteodystrophy: Secondary | ICD-10-CM | POA: Diagnosis not present

## 2022-02-09 DIAGNOSIS — Z992 Dependence on renal dialysis: Secondary | ICD-10-CM | POA: Diagnosis not present

## 2022-02-11 DIAGNOSIS — Z992 Dependence on renal dialysis: Secondary | ICD-10-CM | POA: Diagnosis not present

## 2022-02-11 DIAGNOSIS — N186 End stage renal disease: Secondary | ICD-10-CM | POA: Diagnosis not present

## 2022-02-11 DIAGNOSIS — N250 Renal osteodystrophy: Secondary | ICD-10-CM | POA: Diagnosis not present

## 2022-02-11 DIAGNOSIS — N2581 Secondary hyperparathyroidism of renal origin: Secondary | ICD-10-CM | POA: Diagnosis not present

## 2022-02-13 DIAGNOSIS — Z992 Dependence on renal dialysis: Secondary | ICD-10-CM | POA: Diagnosis not present

## 2022-02-13 DIAGNOSIS — N2581 Secondary hyperparathyroidism of renal origin: Secondary | ICD-10-CM | POA: Diagnosis not present

## 2022-02-13 DIAGNOSIS — N250 Renal osteodystrophy: Secondary | ICD-10-CM | POA: Diagnosis not present

## 2022-02-13 DIAGNOSIS — N186 End stage renal disease: Secondary | ICD-10-CM | POA: Diagnosis not present

## 2022-02-16 ENCOUNTER — Other Ambulatory Visit: Payer: Self-pay

## 2022-02-16 DIAGNOSIS — N2581 Secondary hyperparathyroidism of renal origin: Secondary | ICD-10-CM | POA: Diagnosis not present

## 2022-02-16 DIAGNOSIS — N250 Renal osteodystrophy: Secondary | ICD-10-CM | POA: Diagnosis not present

## 2022-02-16 DIAGNOSIS — Z992 Dependence on renal dialysis: Secondary | ICD-10-CM | POA: Diagnosis not present

## 2022-02-16 DIAGNOSIS — N186 End stage renal disease: Secondary | ICD-10-CM | POA: Diagnosis not present

## 2022-02-16 DIAGNOSIS — C9000 Multiple myeloma not having achieved remission: Secondary | ICD-10-CM

## 2022-02-17 ENCOUNTER — Inpatient Hospital Stay: Payer: Medicare PPO | Attending: Hematology

## 2022-02-17 ENCOUNTER — Inpatient Hospital Stay: Payer: Medicare PPO

## 2022-02-17 VITALS — BP 126/84 | HR 93 | Temp 98.1°F | Resp 18

## 2022-02-17 DIAGNOSIS — C9000 Multiple myeloma not having achieved remission: Secondary | ICD-10-CM | POA: Insufficient documentation

## 2022-02-17 DIAGNOSIS — Z5112 Encounter for antineoplastic immunotherapy: Secondary | ICD-10-CM | POA: Diagnosis not present

## 2022-02-17 LAB — CBC WITH DIFFERENTIAL/PLATELET
Abs Immature Granulocytes: 0.02 10*3/uL (ref 0.00–0.07)
Basophils Absolute: 0 10*3/uL (ref 0.0–0.1)
Basophils Relative: 1 %
Eosinophils Absolute: 0.2 10*3/uL (ref 0.0–0.5)
Eosinophils Relative: 5 %
HCT: 36.5 % (ref 36.0–46.0)
Hemoglobin: 11.5 g/dL — ABNORMAL LOW (ref 12.0–15.0)
Immature Granulocytes: 0 %
Lymphocytes Relative: 18 %
Lymphs Abs: 0.8 10*3/uL (ref 0.7–4.0)
MCH: 29.5 pg (ref 26.0–34.0)
MCHC: 31.5 g/dL (ref 30.0–36.0)
MCV: 93.6 fL (ref 80.0–100.0)
Monocytes Absolute: 0.5 10*3/uL (ref 0.1–1.0)
Monocytes Relative: 11 %
Neutro Abs: 3 10*3/uL (ref 1.7–7.7)
Neutrophils Relative %: 65 %
Platelets: 207 10*3/uL (ref 150–400)
RBC: 3.9 MIL/uL (ref 3.87–5.11)
RDW: 17.2 % — ABNORMAL HIGH (ref 11.5–15.5)
WBC: 4.6 10*3/uL (ref 4.0–10.5)
nRBC: 0 % (ref 0.0–0.2)

## 2022-02-17 LAB — COMPREHENSIVE METABOLIC PANEL
ALT: 9 U/L (ref 0–44)
AST: 15 U/L (ref 15–41)
Albumin: 3.8 g/dL (ref 3.5–5.0)
Alkaline Phosphatase: 52 U/L (ref 38–126)
Anion gap: 15 (ref 5–15)
BUN: 23 mg/dL (ref 8–23)
CO2: 22 mmol/L (ref 22–32)
Calcium: 7.8 mg/dL — ABNORMAL LOW (ref 8.9–10.3)
Chloride: 98 mmol/L (ref 98–111)
Creatinine, Ser: 7.44 mg/dL — ABNORMAL HIGH (ref 0.44–1.00)
GFR, Estimated: 5 mL/min — ABNORMAL LOW (ref >60)
Glucose, Bld: 95 mg/dL (ref 70–99)
Potassium: 4.1 mmol/L (ref 3.5–5.1)
Sodium: 135 mmol/L (ref 135–145)
Total Bilirubin: 0.2 mg/dL — ABNORMAL LOW (ref 0.3–1.2)
Total Protein: 6.9 g/dL (ref 6.5–8.1)

## 2022-02-17 LAB — MAGNESIUM: Magnesium: 2.1 mg/dL (ref 1.7–2.4)

## 2022-02-17 MED ORDER — PROCHLORPERAZINE MALEATE 10 MG PO TABS
10.0000 mg | ORAL_TABLET | Freq: Once | ORAL | Status: AC
  Administered 2022-02-17: 10 mg via ORAL
  Filled 2022-02-17: qty 1

## 2022-02-17 MED ORDER — BORTEZOMIB CHEMO SQ INJECTION 3.5 MG (2.5MG/ML)
1.2000 mg/m2 | Freq: Once | INTRAMUSCULAR | Status: AC
  Administered 2022-02-17: 2 mg via SUBCUTANEOUS
  Filled 2022-02-17: qty 0.8

## 2022-02-17 NOTE — Patient Instructions (Signed)
[facility]  Discharge Instructions: Thank you for choosing [facility] to provide your oncology and hematology care.  If you have a lab appointment with the [facility], please come in thru the Main Entrance and check in at the main information desk.  Wear comfortable clothing and clothing appropriate for easy access to any Portacath or PICC line.   We strive to give you quality time with your provider. You may need to reschedule your appointment if you arrive late (15 or more minutes).  Arriving late affects you and other patients whose appointments are after yours.  Also, if you miss three or more appointments without notifying the office, you may be dismissed from the clinic at the provider's discretion.      For prescription refill requests, have your pharmacy contact our office and allow 72 hours for refills to be completed.    Today you received the following chemotherapy and/or immunotherapy agents Velcade.  Bortezomib Injection What is this medication? BORTEZOMIB (bor TEZ oh mib) treats lymphoma. It may also be used to treat multiple myeloma, a type of bone marrow cancer. It works by blocking a protein that causes cancer cells to grow and multiply. This helps to slow or stop the spread of cancer cells. This medicine may be used for other purposes; ask your health care provider or pharmacist if you have questions. COMMON BRAND NAME(S): Velcade What should I tell my care team before I take this medication? They need to know if you have any of these conditions: Dehydration Diabetes Heart disease Liver disease Tingling of the fingers or toes or other nerve disorder An unusual or allergic reaction to bortezomib, other medications, foods, dyes, or preservatives If you or your partner are pregnant or trying to get pregnant Breastfeeding How should I use this medication? This medication is injected into a vein or under the skin. It is given by your  care team in a hospital or clinic setting. Talk to your care team about the use of this medication in children. Special care may be needed. Overdosage: If you think you have taken too much of this medicine contact a poison control center or emergency room at once. NOTE: This medicine is only for you. Do not share this medicine with others. What if I miss a dose? Keep appointments for follow-up doses. It is important not to miss your dose. Call your care team if you are unable to keep an appointment. What may interact with this medication? Ketoconazole Rifampin This list may not describe all possible interactions. Give your health care provider a list of all the medicines, herbs, non-prescription drugs, or dietary supplements you use. Also tell them if you smoke, drink alcohol, or use illegal drugs. Some items may interact with your medicine. What should I watch for while using this medication? Your condition will be monitored carefully while you are receiving this medication. You may need blood work while taking this medication. This medication may affect your coordination, reaction time, or judgment. Do not drive or operate machinery until you know how this medication affects you. Sit up or stand slowly to reduce the risk of dizzy or fainting spells. Drinking alcohol with this medication can increase the risk of these side effects. This medication may increase your risk of getting an infection. Call your care team for advice if you get a fever, chills, sore throat, or other symptoms of a cold or flu. Do not treat yourself. Try to avoid being around  people who are sick. Check with your care team if you have severe diarrhea, nausea, and vomiting, or if you sweat a lot. The loss of too much body fluid may make it dangerous for you to take this medication. Talk to your care team if you may be pregnant. Serious birth defects can occur if you take this medication during pregnancy and for 7 months after the  last dose. You will need a negative pregnancy test before starting this medication. Contraception is recommended while taking this medication and for 7 months after the last dose. Your care team can help you find the option that works for you. If your partner can get pregnant, use a condom during sex while taking this medication and for 4 months after the last dose. Do not breastfeed while taking this medication and for 2 months after the last dose. This medication may cause infertility. Talk to your care team if you are concerned about your fertility. What side effects may I notice from receiving this medication? Side effects that you should report to your care team as soon as possible: Allergic reactions--skin rash, itching, hives, swelling of the face, lips, tongue, or throat Bleeding--bloody or black, tar-like stools, vomiting blood or  material that looks like coffee grounds, red or dark  urine, small red or purple spots on skin, unusual bruising or bleeding Bleeding in the brain--severe headache, stiff neck, confusion, dizziness, change in vision, numbness or weakness of the face, arm, or leg, trouble speaking, trouble walking, vomiting Bowel blockage--stomach cramping, unable to have a bowel movement or pass gas, loss of appetite, vomiting Heart failure--shortness of breath, swelling of the ankles, feet, or hands, sudden weight gain, unusual weakness or fatigue Infection--fever, chills, cough, sore throat, wounds that don't heal, pain or trouble when passing urine, general feeling of discomfort or being unwell Liver injury--right upper belly pain, loss of appetite, nausea, light-colored stool, dark yellow or  urine, yellowing skin or eyes, unusual weakness or fatigue Low blood pressure--dizziness, feeling faint or lightheaded, blurry vision Lung injury--shortness of breath or trouble breathing, cough, spitting up blood, chest pain, fever Pain, tingling, or numbness in the hands  or feet Severe or prolonged diarrhea Stomach pain, bloody diarrhea, pale skin, unusual weakness or fatigue, decrease in the amount of urine, which may be signs of hemolytic uremic syndrome Sudden and severe headache, confusion, change in vision, seizures, which may be signs of posterior reversible encephalopathy syndrome (PRES) TTP--purple spots on the skin or inside the mouth, pale skin, yellowing skin or eyes, unusual weakness or fatigue, fever, fast or irregular heartbeat, confusion, change in vision, trouble speaking, trouble walking Tumor lysis syndrome (TLS)--nausea, vomiting, diarrhea, decrease in the amount of urine, dark urine, unusual weakness or fatigue, confusion, muscle pain or cramps, fast or irregular heartbeat, joint pain Side effects that usually do not require medical attention (report to your care team if they continue or are bothersome): Constipation Diarrhea Fatigue Loss of appetite Nausea This list may not describe all possible side effects. Call your doctor for medical advice about side effects. You may report side effects to FDA at 1-800-FDA-1088. Where should I keep my medication? This medication is given in a hospital or clinic. It will not be stored at home. NOTE: This sheet is a summary. It may not cover all possible information. If you have questions about this medicine, talk to your doctor, pharmacist, or health care provider.  2023 Elsevier/Gold Standard (2021-07-23 00:00:00)        To help  prevent nausea and vomiting after your treatment, we encourage you to take your nausea medication as directed.  BELOW ARE SYMPTOMS THAT SHOULD BE REPORTED IMMEDIATELY: *FEVER GREATER THAN 100.4 F (38 C) OR HIGHER *CHILLS OR SWEATING *NAUSEA AND VOMITING THAT IS NOT CONTROLLED WITH YOUR NAUSEA MEDICATION *UNUSUAL SHORTNESS OF BREATH *UNUSUAL BRUISING OR BLEEDING *URINARY PROBLEMS (pain or burning when urinating, or frequent urination) *BOWEL PROBLEMS (unusual diarrhea,  constipation, pain near the anus) TENDERNESS IN MOUTH AND THROAT WITH OR WITHOUT PRESENCE OF ULCERS (sore throat, sores in mouth, or a toothache) UNUSUAL RASH, SWELLING OR PAIN  UNUSUAL VAGINAL DISCHARGE OR ITCHING   Items with * indicate a potential emergency and should be followed up as soon as possible or go to the Emergency Department if any problems should occur.  Please show the CHEMOTHERAPY ALERT CARD or IMMUNOTHERAPY ALERT CARD at check-in to the Emergency Department and triage nurse.  Should you have questions after your visit or need to cancel or reschedule your appointment, please contact [facility] [phone]  and follow the prompts.  Office hours are 8:00 a.m. to 4:30 p.m. Monday - Friday. Please note that voicemails left after 4:00 p.m. may not be returned until the following business day.  We are closed weekends and major holidays. You have access to a nurse at all times for urgent questions. Please call the main number to the clinic [phone] and follow the prompts.  For any non-urgent questions, you may also contact your provider using MyChart. We now offer e-Visits for anyone 26 and older to request care online for non-urgent symptoms. For details visit [email or web address].   Also download the MyChart app! Go to the app store, search "MyChart", open the app, select [facility], and log in with your MyChart username and password.  Masks are optional in the cancer centers. If you would like for your care team to wear a mask while they are taking care of you, please let them know. You may have one support person who is at least 84 years old accompany you for your appointments.

## 2022-02-17 NOTE — Progress Notes (Signed)
Patient presents today for Velcade injection.  Patient is in satisfactory condition with no new complaints voiced today.  Vital signs are stable.  Labs reviewed.  Creatinine is 7.44, but we disregard due to dialysis.  All other labs are within treatment parameters.  We will proceed with injection per MD orders.   Patient tolerated injection well with no complaints voiced.  Patient left via wheelchair with [facility] staff in stable condition.  Vital signs stable at discharge.  Follow up as scheduled.

## 2022-02-18 DIAGNOSIS — N250 Renal osteodystrophy: Secondary | ICD-10-CM | POA: Diagnosis not present

## 2022-02-18 DIAGNOSIS — N186 End stage renal disease: Secondary | ICD-10-CM | POA: Diagnosis not present

## 2022-02-18 DIAGNOSIS — Z992 Dependence on renal dialysis: Secondary | ICD-10-CM | POA: Diagnosis not present

## 2022-02-18 DIAGNOSIS — N2581 Secondary hyperparathyroidism of renal origin: Secondary | ICD-10-CM | POA: Diagnosis not present

## 2022-02-20 DIAGNOSIS — N250 Renal osteodystrophy: Secondary | ICD-10-CM | POA: Diagnosis not present

## 2022-02-20 DIAGNOSIS — N186 End stage renal disease: Secondary | ICD-10-CM | POA: Diagnosis not present

## 2022-02-20 DIAGNOSIS — Z992 Dependence on renal dialysis: Secondary | ICD-10-CM | POA: Diagnosis not present

## 2022-02-20 DIAGNOSIS — N2581 Secondary hyperparathyroidism of renal origin: Secondary | ICD-10-CM | POA: Diagnosis not present

## 2022-02-23 DIAGNOSIS — N250 Renal osteodystrophy: Secondary | ICD-10-CM | POA: Diagnosis not present

## 2022-02-23 DIAGNOSIS — N2581 Secondary hyperparathyroidism of renal origin: Secondary | ICD-10-CM | POA: Diagnosis not present

## 2022-02-23 DIAGNOSIS — N186 End stage renal disease: Secondary | ICD-10-CM | POA: Diagnosis not present

## 2022-02-23 DIAGNOSIS — Z992 Dependence on renal dialysis: Secondary | ICD-10-CM | POA: Diagnosis not present

## 2022-02-25 DIAGNOSIS — N186 End stage renal disease: Secondary | ICD-10-CM | POA: Diagnosis not present

## 2022-02-25 DIAGNOSIS — N250 Renal osteodystrophy: Secondary | ICD-10-CM | POA: Diagnosis not present

## 2022-02-25 DIAGNOSIS — N2581 Secondary hyperparathyroidism of renal origin: Secondary | ICD-10-CM | POA: Diagnosis not present

## 2022-02-25 DIAGNOSIS — Z992 Dependence on renal dialysis: Secondary | ICD-10-CM | POA: Diagnosis not present

## 2022-02-28 DIAGNOSIS — Z992 Dependence on renal dialysis: Secondary | ICD-10-CM | POA: Diagnosis not present

## 2022-02-28 DIAGNOSIS — N2581 Secondary hyperparathyroidism of renal origin: Secondary | ICD-10-CM | POA: Diagnosis not present

## 2022-02-28 DIAGNOSIS — N250 Renal osteodystrophy: Secondary | ICD-10-CM | POA: Diagnosis not present

## 2022-02-28 DIAGNOSIS — N186 End stage renal disease: Secondary | ICD-10-CM | POA: Diagnosis not present

## 2022-03-03 ENCOUNTER — Inpatient Hospital Stay: Payer: Medicare PPO

## 2022-03-03 ENCOUNTER — Other Ambulatory Visit: Payer: Self-pay

## 2022-03-03 DIAGNOSIS — C9000 Multiple myeloma not having achieved remission: Secondary | ICD-10-CM

## 2022-03-03 DIAGNOSIS — N250 Renal osteodystrophy: Secondary | ICD-10-CM | POA: Diagnosis not present

## 2022-03-03 DIAGNOSIS — Z992 Dependence on renal dialysis: Secondary | ICD-10-CM | POA: Diagnosis not present

## 2022-03-03 DIAGNOSIS — N186 End stage renal disease: Secondary | ICD-10-CM | POA: Diagnosis not present

## 2022-03-03 DIAGNOSIS — N2581 Secondary hyperparathyroidism of renal origin: Secondary | ICD-10-CM | POA: Diagnosis not present

## 2022-03-04 ENCOUNTER — Inpatient Hospital Stay: Payer: Medicare PPO

## 2022-03-04 DIAGNOSIS — N2581 Secondary hyperparathyroidism of renal origin: Secondary | ICD-10-CM | POA: Diagnosis not present

## 2022-03-04 DIAGNOSIS — N250 Renal osteodystrophy: Secondary | ICD-10-CM | POA: Diagnosis not present

## 2022-03-04 DIAGNOSIS — N186 End stage renal disease: Secondary | ICD-10-CM | POA: Diagnosis not present

## 2022-03-04 DIAGNOSIS — Z992 Dependence on renal dialysis: Secondary | ICD-10-CM | POA: Diagnosis not present

## 2022-03-05 ENCOUNTER — Encounter: Payer: Self-pay | Admitting: Adult Health

## 2022-03-05 ENCOUNTER — Non-Acute Institutional Stay (SKILLED_NURSING_FACILITY): Payer: Medicare PPO | Admitting: Adult Health

## 2022-03-05 DIAGNOSIS — Z992 Dependence on renal dialysis: Secondary | ICD-10-CM | POA: Diagnosis not present

## 2022-03-05 DIAGNOSIS — E1122 Type 2 diabetes mellitus with diabetic chronic kidney disease: Secondary | ICD-10-CM | POA: Diagnosis not present

## 2022-03-05 DIAGNOSIS — K259 Gastric ulcer, unspecified as acute or chronic, without hemorrhage or perforation: Secondary | ICD-10-CM

## 2022-03-05 DIAGNOSIS — K219 Gastro-esophageal reflux disease without esophagitis: Secondary | ICD-10-CM | POA: Diagnosis not present

## 2022-03-05 DIAGNOSIS — E43 Unspecified severe protein-calorie malnutrition: Secondary | ICD-10-CM | POA: Diagnosis not present

## 2022-03-05 DIAGNOSIS — F339 Major depressive disorder, recurrent, unspecified: Secondary | ICD-10-CM | POA: Diagnosis not present

## 2022-03-05 DIAGNOSIS — N186 End stage renal disease: Secondary | ICD-10-CM

## 2022-03-05 NOTE — Progress Notes (Signed)
Location:  [facility] Room Number: 110 D Place of Service:  SNF (31) Provider:  Ok Edwards, NP  CODE STATUS: DNR  Allergies  Allergen Reactions   Ace Inhibitors Other (See Comments) and Cough    Tongue swell , ie angioedema   Angiotensin Receptor Blockers     Angioedema with ACE-I   Penicillins Other (See Comments)    Unsure of reaction Has patient had a PCN reaction causing immediate rash, facial/tongue/throat swelling, SOB or lightheadedness with hypotension: Unknown Has patient had a PCN reaction causing severe rash involving mucus membranes or skin necrosis: Unknown Has patient had a PCN reaction that required hospitalization: No Has patient had a PCN reaction occurring within the last 10 years: Unknown If all of the above answers are "NO", then may proceed with Cephalosporin use.     Penicillin G     Chief Complaint  Patient presents with   Medical Management of Chronic Issues                              Major depression chronic recurrent: Protein calorie malnutrition: Multiple gastric ulcers/duodenal ulcer/ upper GI bleed: esophageal reflux disease without esophagitis:  History of right hip fracture: Chronic kidney disease with end stage renal disease on dialysis dye to type 2 diabetes mellitus:/dependence on dialysis    HPI:  She is a 86 year old long term resident of this facility being seen for the management of her chronic illnesses:  Major depression chronic recurrent: Protein calorie malnutrition: Multiple gastric ulcers/duodenal ulcer/ upper GI bleed: esophageal reflux disease without esophagitis:  History of right hip fracture: Chronic kidney disease with end stage renal disease on dialysis dye to type 2 diabetes mellitus:/dependence on dialysis. She is tolerating dialysis without difficulty. There are no report of depressive thoughts or anxiety   Past Medical History:  Diagnosis Date   Anemia     chronic macrocytic anemia   Anxiety     Chronic kidney disease    Chronic renal disease, stage 4, severely decreased glomerular filtration rate (GFR) between 15-29 mL/min/1.73 square meter ([place]) 39/17/9217   Complication of anesthesia    delirious after Breast Surgery   Dementia ([place])    mild   Depression    Diabetes mellitus with ESRD (end-stage renal disease) ([place])    type II   Dysphagia    Dyspnea    with activity   GERD (gastroesophageal reflux disease)    Glaucoma    Hyperlipidemia    Hypertension    Multiple myeloma ([facility])    Pneumonia    Stage 1 infiltrating ductal carcinoma of right female breast ([place]) 08/21/2015   ER+ PR+ HER 2 neu + (3+) T1cN0     Past Surgical History:  Procedure Laterality Date   A/V FISTULAGRAM Right 07/05/2020   Procedure: A/V Fistulagram;  Surgeon: Elam Dutch, MD;  Location: [facility];  Service: Cardiovascular;  Laterality: Right;   AV FISTULA PLACEMENT Left 11/22/2017   Procedure: ARTERIOVENOUS (AV) FISTULA CREATION LEFT ARM;  Surgeon: Elam Dutch, MD;  Location: [facility] OR;  Service: Vascular;  Laterality: Left;   AV FISTULA PLACEMENT Right 04/04/2020   Procedure: RIGHT ARM ARTERIOVENOUS FISTULA CREATION;  Surgeon: Rosetta Posner, MD;  Location: [facility];  Service: Vascular;  Laterality: Right;   AV FISTULA PLACEMENT Right 08/20/2020   Procedure: ARTERIOVENOUS (AV) FISTULA LIGATION RIGHT ARM;  Surgeon: Elam Dutch, MD;  Location: [facility] OR;  Service: Vascular;  Laterality: Right;   BIOPSY  08/07/2016   Procedure: BIOPSY;  Surgeon: Daneil Dolin, MD;  [facility];  Service: Endoscopy;;  gastric ulcer biopsy   COLONOSCOPY     ESOPHAGOGASTRODUODENOSCOPY N/A 08/07/2016   LA Grade A esophagitis s/p dilation, small hiatal hernia, multiple gastric ulcers and erosions, duodenal erosions s/p biopsy. Negative H.pylori    ESOPHAGOGASTRODUODENOSCOPY N/A 11/27/2016   normal esophagus, previously noted gastric ulcers completely healed, normal duodenum.     ESOPHAGOGASTRODUODENOSCOPY (EGD) WITH PROPOFOL N/A 07/23/2020   Procedure: ESOPHAGOGASTRODUODENOSCOPY (EGD) WITH PROPOFOL;  Surgeon: Rogene Houston, MD;  [facility];  Service: Endoscopy;  Laterality: N/A;   FISTULA SUPERFICIALIZATION Left 02/14/2018   Procedure: FISTULA SUPERFICIALIZATION LEFT ARM;  Surgeon: Angelia Mould, MD;  Location[facility] OR;  Service: Vascular;  Laterality: Left;   FRACTURE SURGERY Right    ankle   HOT HEMOSTASIS  07/23/2020   Procedure: HOT HEMOSTASIS (ARGON PLASMA COAGULATION/BICAP);  Surgeon: Rogene Houston, MD;  [facility];  Service: Endoscopy;;   INTRAMEDULLARY (IM) NAIL INTERTROCHANTERIC Right 07/12/2020   Procedure: INTRAMEDULLARY (IM) NAIL INTERTROCHANTRIC;  Surgeon: Mordecai Rasmussen, MD;  Location: [facility];  Service: Orthopedics;  Laterality: Right;   MALONEY DILATION N/A 08/07/2016   Procedure: Venia Minks DILATION;  Surgeon: Daneil Dolin, MD;  [facility];  Service: Endoscopy;  Laterality: N/A;   MASTECTOMY, PARTIAL Right    PERIPHERAL VASCULAR BALLOON ANGIOPLASTY Left 07/13/2019   Procedure: PERIPHERAL VASCULAR BALLOON ANGIOPLASTY;  Surgeon: Marty Heck, MD;  Location: [facility];  Service: Cardiovascular;  Laterality: Left;  arm fistulogram   PERIPHERAL VASCULAR BALLOON ANGIOPLASTY Right 05/22/2020   Procedure: PERIPHERAL VASCULAR BALLOON ANGIOPLASTY;  Surgeon: Cherre Robins, MD;  Location: [facility];  Service: Cardiovascular;  Laterality: Right;  arm fistula   PERIPHERAL VASCULAR BALLOON ANGIOPLASTY Right 07/05/2020   Procedure: PERIPHERAL VASCULAR BALLOON ANGIOPLASTY;  Surgeon: Elam Dutch, MD;  Location: [facility];  Service: Cardiovascular;  Laterality: Right;  arm fistula   PORT-A-CATH REMOVAL Left 11/22/2017   Procedure: REMOVAL PORT-A-CATH LEFT CHEST;  Surgeon: Elam Dutch, MD;  Location: [facility];  Service: Vascular;  Laterality: Left;   RETINAL DETACHMENT SURGERY  Right    SCLEROTHERAPY  07/23/2020   Procedure: SCLEROTHERAPY;  Surgeon: Rogene Houston, MD;  [facility];  Service: Endoscopy;;   UPPER EXTREMITY VENOGRAPHY N/A 08/22/2020   Procedure: LEFT UPPER & CENTRAL VENOGRAPHY;  Surgeon: Marty Heck, MD;  Location: [facility];  Service: Cardiovascular;  Laterality: N/A;    Social History   Socioeconomic History   Marital status: Single    Spouse name: Not on file   Number of children: Not on file   Years of education: Not on file   Highest education level: Not on file  Occupational History   Occupation: retired   Tobacco Use   Smoking status: Never   Smokeless tobacco: Never  Vaping Use   Vaping Use: Never used  Substance and Sexual Activity   Alcohol use: No    Alcohol/week: 0.0 standard drinks of alcohol   Drug use: No   Sexual activity: Never  Other Topics Concern   Not on file  Social History Narrative   Long term resident of SNF    Social Determinants of Health   Financial Resource Strain: Low Risk  (01/09/2020)   Overall Financial Resource Strain (CARDIA)  Difficulty of Paying Living Expenses: Not very hard  Food Insecurity: No Food Insecurity (01/09/2020)   Hunger Vital Sign    Worried About Running Out of Food in the Last Year: Never true    Ran Out of Food in the Last Year: Never true  Transportation Needs: No Transportation Needs (01/09/2020)   PRAPARE - Hydrologist (Medical): No    Lack of Transportation (Non-Medical): No  Physical Activity: Inactive (01/09/2020)   Exercise Vital Sign    Days of Exercise per Week: 0 days    Minutes of Exercise per Session: 0 min  Stress: No Stress Concern Present (01/09/2020)   [place]    Feeling of Stress : Not at all  Social Connections: Moderately Isolated (01/09/2020)   Social Connection and Isolation Panel [NHANES]    Frequency of Communication with  Friends and Family: More than three times a week    Frequency of Social Gatherings with Friends and Family: Once a week    Attends Religious Services: More than 4 times per year    Active Member of [employer] or Organizations: No    Attends Archivist Meetings: Never    Marital Status: Never married  Intimate Partner Violence: Not At Risk (01/09/2020)   Humiliation, Afraid, Rape, and Kick questionnaire    Fear of Current or Ex-Partner: No    Emotionally Abused: No    Physically Abused: No    Sexually Abused: No   Family History  Problem Relation Age of Onset   Multiple myeloma Sister    Brain cancer Sister    Dementia Mother        died at 32   Stroke Mother    Heart failure Mother    Diabetes Mother    Heart disease Father    Prostate cancer Brother    Colon cancer Neg Hx       VITAL SIGNS BP 119/79   Pulse 95   Temp 98.5 F (36.9 C)   Resp 18   Ht _0  (1.6 m)   Wt 139 lb 9.6 oz (63.3 kg)   SpO2 100%   BMI 24.73 kg/m   Outpatient Encounter Medications as of 03/05/2022  Medication Sig   acetaminophen (TYLENOL) 325 MG tablet Take 650 mg by mouth every 8 (eight) hours.   acyclovir (ZOVIRAX) 200 MG capsule Take 200 mg by mouth in the morning. (0800)   Amino Acids-Protein Hydrolys (FEEDING SUPPLEMENT, PRO-STAT SUGAR FREE 64,) LIQD Take 30 mLs by mouth 3 (three) times daily with meals. (0800, 1200 & 1800)   anastrozole (ARIMIDEX) 1 MG tablet TAKE 1 TABLET BY MOUTH DAILY   calcium-vitamin D (OSCAL WITH D) 500-200 MG-UNIT tablet Take 1 tablet by mouth in the morning. (0800)   denosumab (PROLIA) 60 MG/ML SOSY injection Inject 60 mg into the skin every 6 (six) months.   docusate sodium (COLACE) 100 MG capsule Take 100 mg by mouth 2 (two) times daily.   Insulin Pen Needle (BD AUTOSHIELD DUO) 30G X 5 MM MISC by Does not apply route. 3/16"   melatonin 3 MG TABS tablet Take 6 mg by mouth at bedtime. (2000) For Sleep   multivitamin (RENA-VIT) TABS tablet Take 1 tablet by  mouth at bedtime. (2100)   ondansetron (ZOFRAN) 4 MG tablet Take 1 tablet (4 mg total) by mouth every 6 (six) hours as needed for nausea.   ondansetron (ZOFRAN-ODT) 4 MG disintegrating tablet Take  by mouth daily.   pantoprazole (PROTONIX) 40 MG tablet Take 40 mg by mouth 2 (two) times daily.   sertraline (ZOLOFT) 25 MG tablet Take 25 mg by mouth daily. At 9 am along with 50 mg tablet for a total of 75 mg daily   sertraline (ZOLOFT) 50 MG tablet Take 50 mg by mouth daily. At 9 am along with 25 mg tablet for a total of 75 mg daily   sucralfate (CARAFATE) 1 GM/10ML suspension Take 1 g by mouth in the morning, at noon, in the evening, and at bedtime. (0800, 1200, 1700 & 2100)   [DISCONTINUED] Calcium Acetate 667 MG TABS Take 2 tablets by mouth 3 (three) times daily with meals. 1 tablet daily w/ snack   Facility-Administered Encounter Medications as of 03/05/2022  Medication   lanreotide acetate (SOMATULINE DEPOT) 120 MG/0.5ML injection   lanreotide acetate (SOMATULINE DEPOT) 120 MG/0.5ML injection   lanreotide acetate (SOMATULINE DEPOT) 120 MG/0.5ML injection   octreotide (SANDOSTATIN LAR) 30 MG IM injection   octreotide (SANDOSTATIN LAR) 30 MG IM injection     SIGNIFICANT DIAGNOSTIC EXAMS   PREVIOUS   10-01-21: chest x-ray: No evidence of active cardiopulmonary process.   10-01-21: abdominal ultrasound:  1. Cholelithiasis with no evidence of acute cholecystitis. 2. Increased echogenicity of the liver parenchyma, findings suggestive of hepatic steatosis.  10-01-21: ct of chest abdomen and pelvis  No aspiration pneumonia. Mild focal scarring at the medial left lung base.   Coronary artery calcification. Aortic atherosclerotic calcification. Aortic Atherosclerosis  Probably acute superior endplate fractures at L45, L1 and L2. No retropulsed bone.   Renal atrophy. Multiple renal cysts. Indeterminate 2.7 cm lesion at the lower pole the right kidney could be a complicated cyst or a mass. This  could be evaluated with renal MRI. 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 Nonobstructing 5 mm stone in the lower pole of the left kidney.   1 x 2 cm cystic abnormality in the ventral head of the pancreas. Best practice recommendation is for reimaging of this every 6 months for 2 years and then every 1 year for 2 years and then every 2 years for 6 years. Follow-up can be discontinued if stable for 10 years. This recommendation could be tailored to this patient's clinical status and life expectancy.  Paget's disease of the right hemipelvis. Diverticulosis without evidence of diverticulitis. Small hiatal hernia. Multiple calcified leiomyomas the uterus.  10-24-21: DEXA: t score -2.957  NO NEW EXAMS      LABS REVIEWED PREVIOUS   03-20-21: wbc 5.4; hgb 12.1; hct 37.7; mcv 97.2 plt 254; glucose 111; bun 47; creat 7.34; k+ 4.6; na++ 131; ca 9.6 GFR 5; liver normal albumin 3.9; mag 2.2 03-27-21: hgb a1c 6.1   04-29-21; wbc 5.4; hgb 11.0; hct 34.6;mcv 100.9 plt 179; glucose 82; bun 44; creat 7.04; k+ 4.6; na++ 139; ca 8.7; GFR 5 d-dimer: 1.25; CRP 4.0   05-27-21: wbc 6.0; hgb 10.6; hct 33.5; mcv 101.2 plt 261; glucose 147; bun 45; creat 6.86; k+ 4.5; na++ 138; ca 8.3; GFR 6; protein 6.9; albumin 3.8  06-12-21: wbc 6.5; hgb 8.8; hct 27.7; mcv 101.5 plt 229; glucose 153; bun 40; creat 6.53; k+ 4.3; na++ 136; ca 9.5; GFR 6 protein 6.5; albumin 3.4 mag 2.2  07-07-21: chol 171; ldl 89; trig 265; hdl 29  07-08-21: wbc 15.7; hgb 9.8; hct 32.8; mcv 104.8; plt 226; glucose 164; bun 51; creat 7.74; k+ 4.4; na++ 138; ca 9.7; gfr 5 protein  7.5; albumin 3.9 mag 2.1; chol 171; ldl 89; trig 265; hdl 29; hgb a1c 6.1 08-12-21: wbc 6.3; hgb 10.0; hct 31.9; mcv 103.9 plt 215; glucose 197; bun 40;creat 6.25; k+ 4.2; na++138; ca 9.3; gfr 6; protein 6.7; albumin 3.7; iron 93; tibc 142; ferritin 312 09-30-21: wbc 6.6; hgb 9.6; hct 31.1; mcv 101.3 plt 232; glucose 222; bun 40; creat 6.78; k+ 4.0; na++ 136; ca 9.2; gfr 6; protein 6.8  albumin 3.7  10-01-21: wbc 13.0; hgb 9.1; hct 28.8; mcv 99.3 plt 251; glucose 198; bun 59; creat 9.01; k+ 3.7; na++ 135; ca 9.6; gfr 4; ast 212; alt 173; protein 6.7; albumin 3.6  10-03-21: wbc 7.2; hgb 8.6; hct 27.5; mcv 99.6 plt 179 glucose 105; bun 34; creat 7.04; k+ 3.6; na++ 135; ca 8.8; gfr 5 ast 45; alt 99; protein 6.5; albumin 3.5  10-10-21: hgb a1c 5.3  11-25-21: wbc 4.9; hgb 9.4; hct 30.7; mcv 100.7 plt 237; glucose 171; bun 27; creat 6.88; k+ 4.4; na++ 137; ca 9.0; gfr 6; protein 6.4 albumin 3.5   TODAY 12-16-21: wbc 5.3; hgb 10.1; hct 33.0 mcv 98.5 plt 256; glucose 119; bun 24; creat 6.62; k+ 4.2 na++ 136; ca 9.3 gfr 6; protein 6.4 albumin 3.5; iron 54; tibc 140; ferritin 272  02-03-22: wbc 3.8; hgb 11.5; hct 37.6; mcv 96.4 plt 212; glucose 164; bun 28; creat 7.99; k+ 4.4; na++ 139; ca 8.3 gfr 5; protein 6.7 albumin 3.7 mag 2.1 02-17-22: wbc 4.6; hgb 11.5; hct 36.5; mcv 93.6 plt 207; glucose 95; bun 23 creat 7.44 k+ 4.1; na++ 135; ca 7.8; gf 5 protein 6.9; albumin 3.8   Review of Systems  Constitutional:  Negative for malaise/fatigue.  Respiratory:  Negative for cough and shortness of breath.   Cardiovascular:  Negative for chest pain, palpitations and leg swelling.  Gastrointestinal:  Negative for abdominal pain, constipation and heartburn.  Musculoskeletal:  Negative for back pain, joint pain and myalgias.  Skin: Negative.   Neurological:  Negative for dizziness.  Psychiatric/Behavioral:  The patient is not nervous/anxious.     Physical Exam Constitutional:      General: She is not in acute distress.    Appearance: She is well-developed. She is not diaphoretic.  Neck:     Thyroid: No thyromegaly.  Cardiovascular:     Rate and Rhythm: Normal rate and regular rhythm.     Pulses: Normal pulses.     Heart sounds: Normal heart sounds.  Pulmonary:     Effort: Pulmonary effort is normal. No respiratory distress.     Breath sounds: Normal breath sounds.  Chest:  Breasts:     Right: Absent.  Abdominal:     General: Bowel sounds are normal. There is no distension.     Palpations: Abdomen is soft.     Tenderness: There is no abdominal tenderness.  Musculoskeletal:        General: Normal range of motion.     Cervical back: Neck supple.     Right lower leg: No edema.     Left lower leg: No edema.  Lymphadenopathy:     Cervical: No cervical adenopathy.  Skin:    General: Skin is warm and dry.     Comments: Chest wall dialysis access   Neurological:     Mental Status: She is alert. Mental status is at baseline.  Psychiatric:        Mood and Affect: Mood normal.      ASSESSMENT/PLAN  TODAY  Major depression  chronic recurrent: will continue zoloft 75 mg daily   2. Protein calorie malnutrition: protein 6.5 albumin 35 will continue prostat and supplements  3. Multiple gastric ulcers/duodenal ulcer/ upper GI bleed: esophageal reflux disease without esophagitis: will continue protonix 40 mg twice daily will change to zofran 4 mg ac  4. History of right hip fracture: will continue tylenol 650 mg three times daily   5. Chronic kidney disease with end stage renal disease on dialysis dye to type 2 diabetes mellitus:/dependence on dialysis: will continue dialysis three days week;h 1200 cc fluid restriction; calcium acetate 1334 mg with meals  PREVIOUS   6. Hypertension due to end stage renal disease (ESRD) due to type 2 diabetes mellitus: b/p 154/91 is off tenormin and clonidine   7. Hyperlipidemia associated with type 2 diabetes mellitus: LDL 89 is off lipitor due to elevated live enzymes  8. Aortic atherosclerosis/ atherosclerotic peripheral vascular disease: (ct 10-04-18) LDL 89  9. Vascular dementia with behavioral disturbance: weight is 142 pounds;   10. Diabetes mellitus type 2 with end stage renal disease (ESRD) hgb a1c 6.1 off lantus  11. Anemia due to end stage renal disease: hgb 10.1   12. Post menopausal osteoporosis: t score -2.957 will  continue prolia 60 mg every 6 months with calcium and vitamin d   13. Stage 1 infiltrating ductal carcinoma of female right breast/status post right mastectomy will continue arimidex 1 mg daily   14. Multiple myeloma not having achieved remission: followed by oncology  15. Herpes without outbreak will continue acyclovir 200 mg daily    Ok Edwards NP [facility]   call [phone]

## 2022-03-06 DIAGNOSIS — Z992 Dependence on renal dialysis: Secondary | ICD-10-CM | POA: Diagnosis not present

## 2022-03-06 DIAGNOSIS — N250 Renal osteodystrophy: Secondary | ICD-10-CM | POA: Diagnosis not present

## 2022-03-06 DIAGNOSIS — N186 End stage renal disease: Secondary | ICD-10-CM | POA: Diagnosis not present

## 2022-03-06 DIAGNOSIS — N2581 Secondary hyperparathyroidism of renal origin: Secondary | ICD-10-CM | POA: Diagnosis not present

## 2022-03-08 DIAGNOSIS — N186 End stage renal disease: Secondary | ICD-10-CM | POA: Diagnosis not present

## 2022-03-08 DIAGNOSIS — Z992 Dependence on renal dialysis: Secondary | ICD-10-CM | POA: Diagnosis not present

## 2022-03-09 DIAGNOSIS — N186 End stage renal disease: Secondary | ICD-10-CM | POA: Diagnosis not present

## 2022-03-09 DIAGNOSIS — N2581 Secondary hyperparathyroidism of renal origin: Secondary | ICD-10-CM | POA: Diagnosis not present

## 2022-03-09 DIAGNOSIS — Z992 Dependence on renal dialysis: Secondary | ICD-10-CM | POA: Diagnosis not present

## 2022-03-09 DIAGNOSIS — N250 Renal osteodystrophy: Secondary | ICD-10-CM | POA: Diagnosis not present

## 2022-03-11 DIAGNOSIS — N250 Renal osteodystrophy: Secondary | ICD-10-CM | POA: Diagnosis not present

## 2022-03-11 DIAGNOSIS — Z992 Dependence on renal dialysis: Secondary | ICD-10-CM | POA: Diagnosis not present

## 2022-03-11 DIAGNOSIS — N2581 Secondary hyperparathyroidism of renal origin: Secondary | ICD-10-CM | POA: Diagnosis not present

## 2022-03-11 DIAGNOSIS — N186 End stage renal disease: Secondary | ICD-10-CM | POA: Diagnosis not present

## 2022-03-13 DIAGNOSIS — N2581 Secondary hyperparathyroidism of renal origin: Secondary | ICD-10-CM | POA: Diagnosis not present

## 2022-03-13 DIAGNOSIS — N186 End stage renal disease: Secondary | ICD-10-CM | POA: Diagnosis not present

## 2022-03-13 DIAGNOSIS — N250 Renal osteodystrophy: Secondary | ICD-10-CM | POA: Diagnosis not present

## 2022-03-13 DIAGNOSIS — Z992 Dependence on renal dialysis: Secondary | ICD-10-CM | POA: Diagnosis not present

## 2022-03-15 IMAGING — US US EXTREM UP VEIN MAPPING UNILAT
1 series · 13 of 24 positions shown · non-contrast
Comparison: None.

CLINICAL DATA: End-stage renal disease. Please perform right upper
extremity venous mapping prior to creation of dialysis access.

Patient with history of failed left upper arm brachial-cephalic AV
fistula.
EXAM:
US EXTREM UP VEIN MAPPING

[Series 1: us ue vein mapping right (pre-op avf) · non-contrast · 13 of 98 slices shown]
[im 1/98]
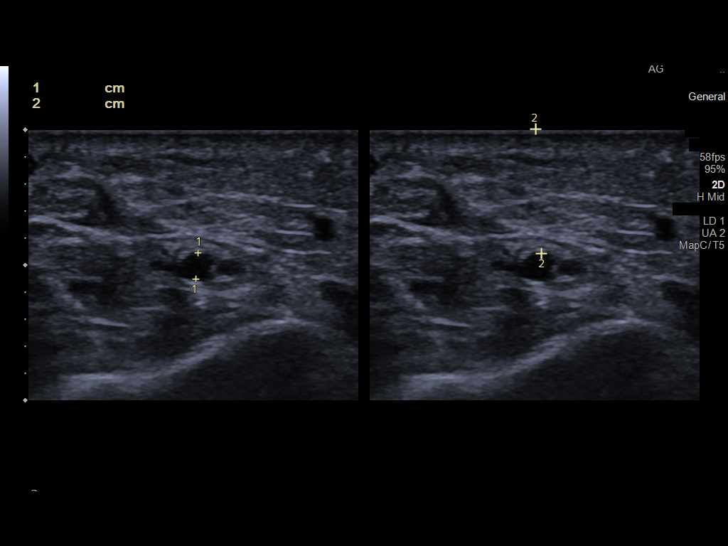
[im 9/98]
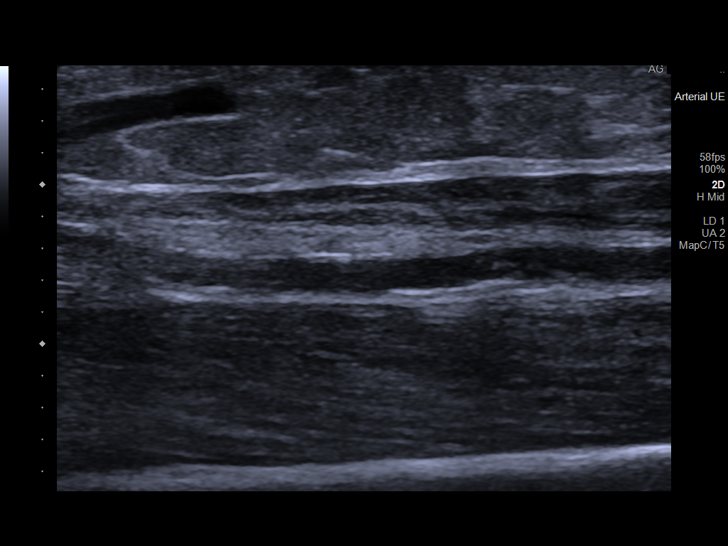
[im 17/98]
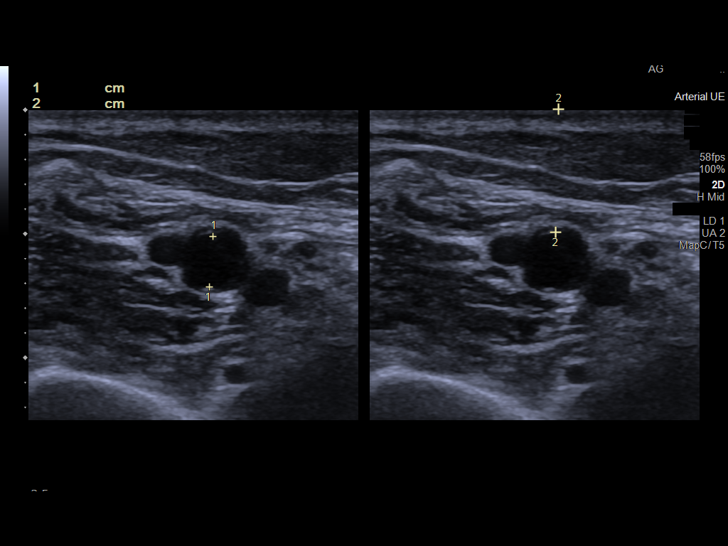
[im 26/98]
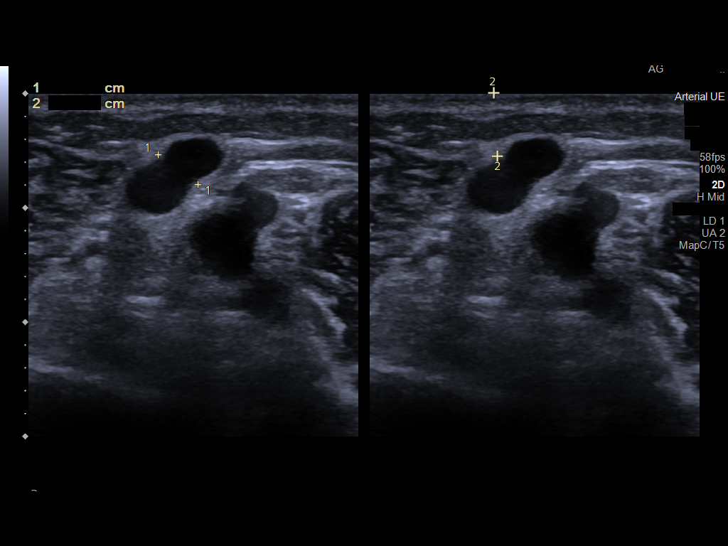
[im 34/98]
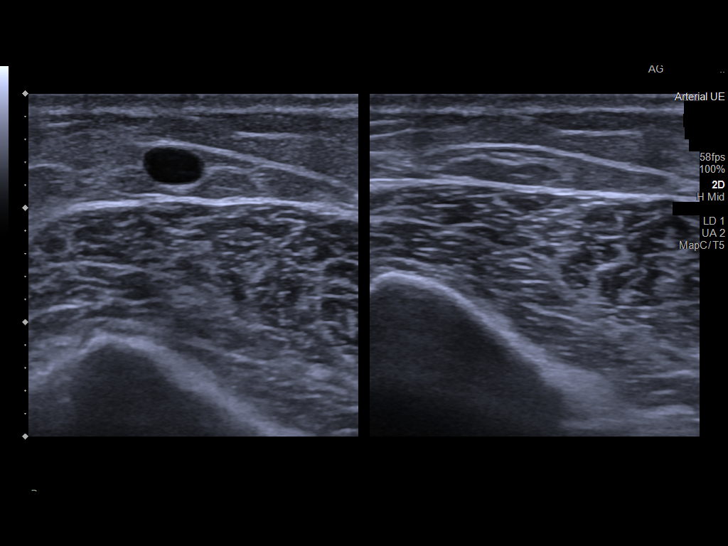
[im 43/98]
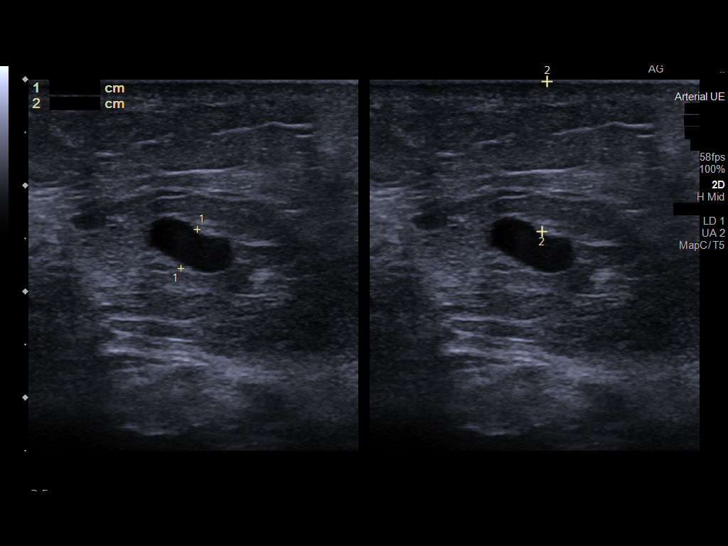
[im 51/98]
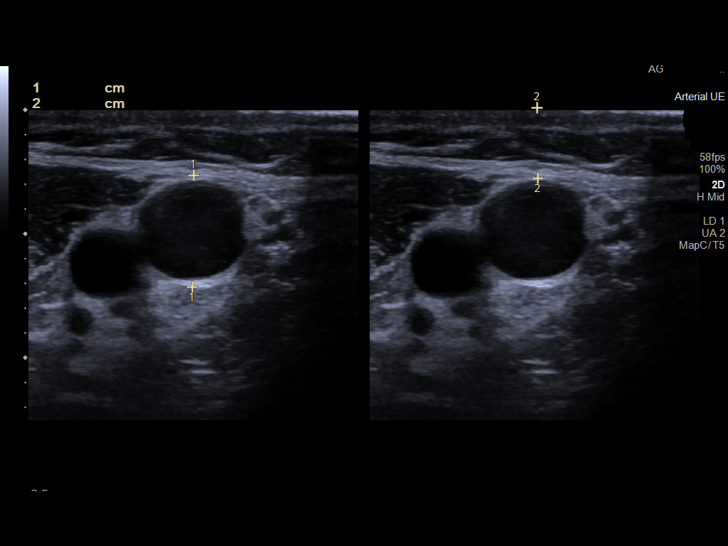
[im 55/98]
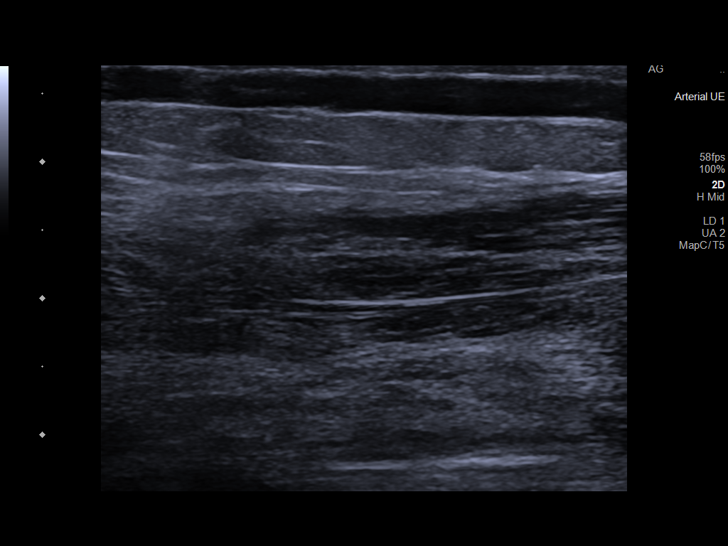
[im 64/98]
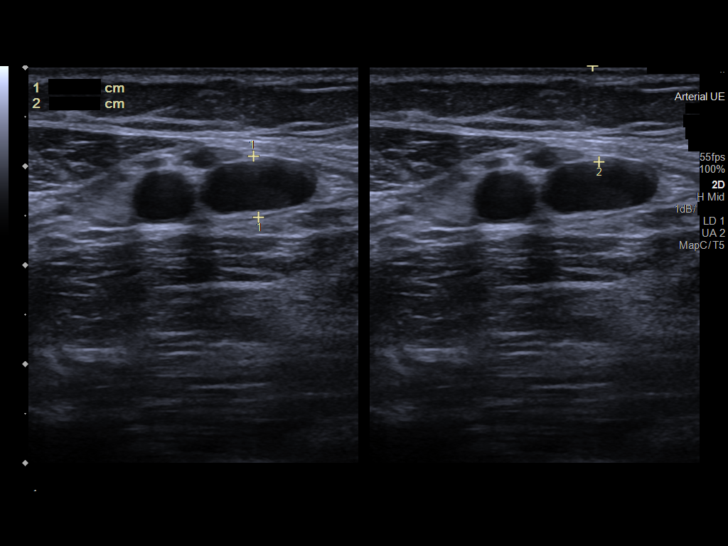
[im 72/98]
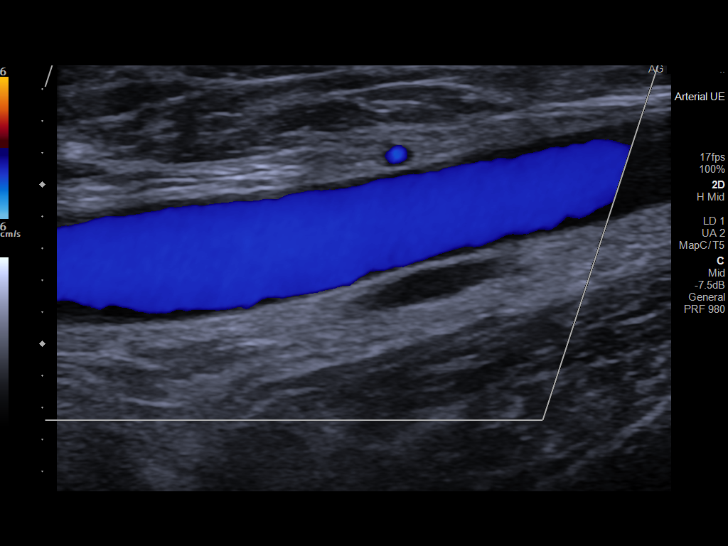
[im 81/98]
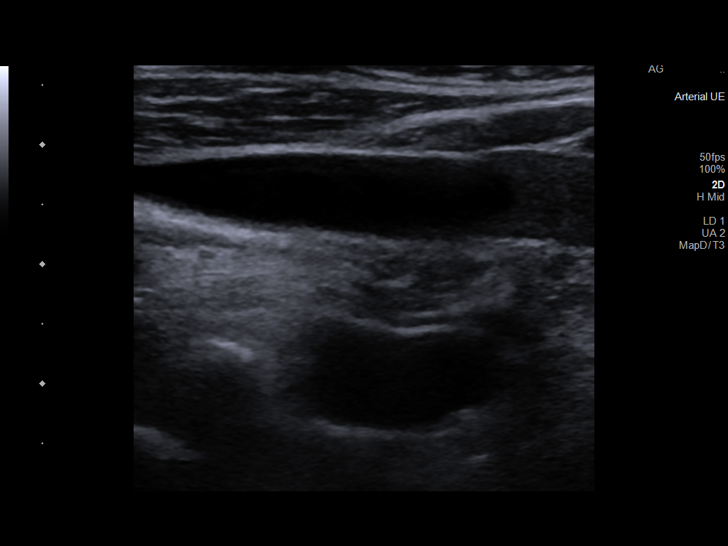
[im 89/98]
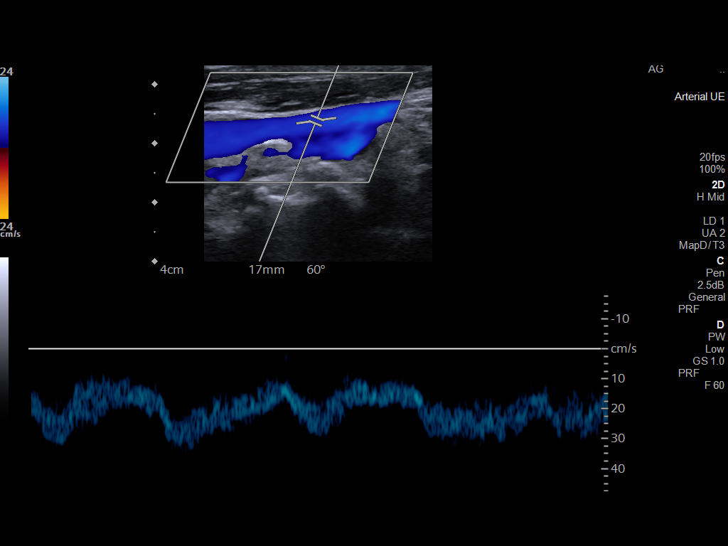
[im 98/98]
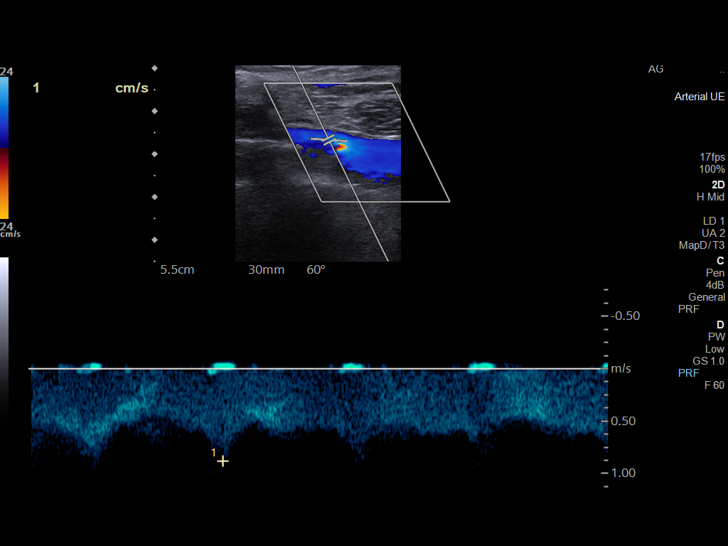

[13 of 24 positions shown; findings below may reference images not displayed]

FINDINGS: RIGHT ARTERIES

Wrist Radial Artery:

Size 2.0 mm Waveform Triphasic

Wrist Ulnar Artery:

Atretic (image 7)

Prox. Forearm Radial Artery:

Size 2.4 mm Waveform Triphasic

Upper Arm Brachial Artery:

Size 4.1 mm Waveform Triphasic

RIGHT VEINS

Forearm Cephalic Vein:

Prox 3.8 mm Distal 3.7 mm Depth 2.7 mm

Upper Arm Cephalic Vein:

Prox 5.4 mm Distal 5.0 mm Depth 4.5 mm

Upper Arm Basilic Vein:

Prox 9.0 mm Distal 5.2 mm Depth 5.7 mm

Upper Arm Brachial Vein:

Prox 4.0 mm Distal 4.2 mm Depth 14 mm

ADDITIONAL RIGHT VEINS

Axillary Vein: 8.9 mm

Subclavian Vein:

Patient: Yes Respiratory Phasicity: Present

Internal Jugular Vein:

Patent: Occluded (image 81).  Respiratory Phasicity: Absent

Branches > 2 mm:

Note is made of a prominent branch of the cephalic vein at the level
of the forearm measuring 4 mm in diameter at a depth of 5 mm (image
29). The branch measures approximately 4 mm at the level of the
antecubital fossa at a depth of 4 mm (image 30).

There is another prominent branch arising from the cephalic vein at
the level of the upper arm measuring approximately 3 mm in diameter
at a depth of 6 mm (image 32).

There is a prominent branch arising from the basilic vein at the
level of the forearm which measures 4 mm in diameter at a depth of
1.4 cm (image 44) and measures 4 mm in diameters at a depth of 4 mm
at the level of the proximal forearm (image 46).

There is another prominent branch arising from the basilic vein at
the level of the upper arm measuring approximately 6 mm in diameter
at a depth of 1.1 cm (image 50.

_________________________________________________________

The left internal jugular, subclavian and axillary veins appear
patent.
IMPRESSION: 1. Right upper extremity venous mapping as above.
2. Age-indeterminate though presumably chronic occlusion of the
right internal jugular vein.

## 2022-03-16 DIAGNOSIS — N186 End stage renal disease: Secondary | ICD-10-CM | POA: Diagnosis not present

## 2022-03-16 DIAGNOSIS — Z992 Dependence on renal dialysis: Secondary | ICD-10-CM | POA: Diagnosis not present

## 2022-03-16 DIAGNOSIS — N2581 Secondary hyperparathyroidism of renal origin: Secondary | ICD-10-CM | POA: Diagnosis not present

## 2022-03-16 DIAGNOSIS — N250 Renal osteodystrophy: Secondary | ICD-10-CM | POA: Diagnosis not present

## 2022-03-17 ENCOUNTER — Inpatient Hospital Stay: Payer: Medicare PPO | Attending: Hematology

## 2022-03-17 ENCOUNTER — Inpatient Hospital Stay: Payer: Medicare PPO

## 2022-03-17 VITALS — BP 127/64 | HR 97 | Temp 98.8°F | Resp 18 | Wt 131.5 lb

## 2022-03-17 DIAGNOSIS — Z170 Estrogen receptor positive status [ER+]: Secondary | ICD-10-CM | POA: Insufficient documentation

## 2022-03-17 DIAGNOSIS — Z5112 Encounter for antineoplastic immunotherapy: Secondary | ICD-10-CM | POA: Insufficient documentation

## 2022-03-17 DIAGNOSIS — Z79811 Long term (current) use of aromatase inhibitors: Secondary | ICD-10-CM | POA: Insufficient documentation

## 2022-03-17 DIAGNOSIS — C50911 Malignant neoplasm of unspecified site of right female breast: Secondary | ICD-10-CM | POA: Diagnosis not present

## 2022-03-17 DIAGNOSIS — C9000 Multiple myeloma not having achieved remission: Secondary | ICD-10-CM

## 2022-03-17 DIAGNOSIS — M8580 Other specified disorders of bone density and structure, unspecified site: Secondary | ICD-10-CM | POA: Insufficient documentation

## 2022-03-17 DIAGNOSIS — N184 Chronic kidney disease, stage 4 (severe): Secondary | ICD-10-CM

## 2022-03-17 DIAGNOSIS — D509 Iron deficiency anemia, unspecified: Secondary | ICD-10-CM

## 2022-03-17 LAB — CBC WITH DIFFERENTIAL/PLATELET
Abs Immature Granulocytes: 0.02 10*3/uL (ref 0.00–0.07)
Basophils Absolute: 0 10*3/uL (ref 0.0–0.1)
Basophils Relative: 1 %
Eosinophils Absolute: 0.2 10*3/uL (ref 0.0–0.5)
Eosinophils Relative: 5 %
HCT: 35.4 % — ABNORMAL LOW (ref 36.0–46.0)
Hemoglobin: 11 g/dL — ABNORMAL LOW (ref 12.0–15.0)
Immature Granulocytes: 0 %
Lymphocytes Relative: 21 %
Lymphs Abs: 1 10*3/uL (ref 0.7–4.0)
MCH: 29.9 pg (ref 26.0–34.0)
MCHC: 31.1 g/dL (ref 30.0–36.0)
MCV: 96.2 fL (ref 80.0–100.0)
Monocytes Absolute: 0.6 10*3/uL (ref 0.1–1.0)
Monocytes Relative: 13 %
Neutro Abs: 2.7 10*3/uL (ref 1.7–7.7)
Neutrophils Relative %: 60 %
Platelets: 243 10*3/uL (ref 150–400)
RBC: 3.68 MIL/uL — ABNORMAL LOW (ref 3.87–5.11)
RDW: 17.8 % — ABNORMAL HIGH (ref 11.5–15.5)
WBC: 4.5 10*3/uL (ref 4.0–10.5)
nRBC: 0 % (ref 0.0–0.2)

## 2022-03-17 LAB — COMPREHENSIVE METABOLIC PANEL
ALT: 10 U/L (ref 0–44)
AST: 17 U/L (ref 15–41)
Albumin: 3.6 g/dL (ref 3.5–5.0)
Alkaline Phosphatase: 55 U/L (ref 38–126)
Anion gap: 13 (ref 5–15)
BUN: 27 mg/dL — ABNORMAL HIGH (ref 8–23)
CO2: 25 mmol/L (ref 22–32)
Calcium: 7.7 mg/dL — ABNORMAL LOW (ref 8.9–10.3)
Chloride: 97 mmol/L — ABNORMAL LOW (ref 98–111)
Creatinine, Ser: 6.8 mg/dL — ABNORMAL HIGH (ref 0.44–1.00)
GFR, Estimated: 6 mL/min — ABNORMAL LOW (ref >60)
Glucose, Bld: 133 mg/dL — ABNORMAL HIGH (ref 70–99)
Potassium: 4.1 mmol/L (ref 3.5–5.1)
Sodium: 135 mmol/L (ref 135–145)
Total Bilirubin: 0.2 mg/dL — ABNORMAL LOW (ref 0.3–1.2)
Total Protein: 6.6 g/dL (ref 6.5–8.1)

## 2022-03-17 MED ORDER — PROCHLORPERAZINE MALEATE 10 MG PO TABS
10.0000 mg | ORAL_TABLET | Freq: Once | ORAL | Status: AC
  Administered 2022-03-17: 10 mg via ORAL
  Filled 2022-03-17: qty 1

## 2022-03-17 MED ORDER — BORTEZOMIB CHEMO SQ INJECTION 3.5 MG (2.5MG/ML)
1.2000 mg/m2 | Freq: Once | INTRAMUSCULAR | Status: AC
  Administered 2022-03-17: 2 mg via SUBCUTANEOUS
  Filled 2022-03-17: qty 0.8

## 2022-03-17 NOTE — Progress Notes (Signed)
Pt presents today for Velcade per provider's order. Vital signs and labs WNL for treatment. Okay to proceed with treatment today.   Velcade given today per MD orders. Tolerated infusion without adverse affects. Vital signs stable. No complaints at this time. Discharged from clinic via wheelchair in stable condition. Alert and oriented x 3. F/U with Carly Wood as scheduled.   

## 2022-03-18 DIAGNOSIS — Z992 Dependence on renal dialysis: Secondary | ICD-10-CM | POA: Diagnosis not present

## 2022-03-18 DIAGNOSIS — N250 Renal osteodystrophy: Secondary | ICD-10-CM | POA: Diagnosis not present

## 2022-03-18 DIAGNOSIS — N186 End stage renal disease: Secondary | ICD-10-CM | POA: Diagnosis not present

## 2022-03-18 DIAGNOSIS — N2581 Secondary hyperparathyroidism of renal origin: Secondary | ICD-10-CM | POA: Diagnosis not present

## 2022-03-18 LAB — KAPPA/LAMBDA LIGHT CHAINS
Kappa free light chain: 162.9 mg/L — ABNORMAL HIGH (ref 3.3–19.4)
Kappa, lambda light chain ratio: 0.34 (ref 0.26–1.65)
Lambda free light chains: 482.6 mg/L — ABNORMAL HIGH (ref 5.7–26.3)

## 2022-03-20 DIAGNOSIS — N2581 Secondary hyperparathyroidism of renal origin: Secondary | ICD-10-CM | POA: Diagnosis not present

## 2022-03-20 DIAGNOSIS — N250 Renal osteodystrophy: Secondary | ICD-10-CM | POA: Diagnosis not present

## 2022-03-20 DIAGNOSIS — N186 End stage renal disease: Secondary | ICD-10-CM | POA: Diagnosis not present

## 2022-03-20 DIAGNOSIS — Z992 Dependence on renal dialysis: Secondary | ICD-10-CM | POA: Diagnosis not present

## 2022-03-20 LAB — PROTEIN ELECTROPHORESIS, SERUM
A/G Ratio: 1.7 (ref 0.7–1.7)
Albumin ELP: 3.8 g/dL (ref 2.9–4.4)
Alpha-1-Globulin: 0.2 g/dL (ref 0.0–0.4)
Alpha-2-Globulin: 0.7 g/dL (ref 0.4–1.0)
Beta Globulin: 0.9 g/dL (ref 0.7–1.3)
Gamma Globulin: 0.5 g/dL (ref 0.4–1.8)
Globulin, Total: 2.3 g/dL (ref 2.2–3.9)
M-Spike, %: 0.2 g/dL — ABNORMAL HIGH
Total Protein ELP: 6.1 g/dL (ref 6.0–8.5)

## 2022-03-23 DIAGNOSIS — N250 Renal osteodystrophy: Secondary | ICD-10-CM | POA: Diagnosis not present

## 2022-03-23 DIAGNOSIS — Z992 Dependence on renal dialysis: Secondary | ICD-10-CM | POA: Diagnosis not present

## 2022-03-23 DIAGNOSIS — N2581 Secondary hyperparathyroidism of renal origin: Secondary | ICD-10-CM | POA: Diagnosis not present

## 2022-03-23 DIAGNOSIS — N186 End stage renal disease: Secondary | ICD-10-CM | POA: Diagnosis not present

## 2022-03-23 LAB — IMMUNOFIXATION ELECTROPHORESIS
IgA: 304 mg/dL (ref 64–422)
IgG (Immunoglobin G), Serum: 670 mg/dL (ref 586–1602)
IgM (Immunoglobulin M), Srm: 8 mg/dL — ABNORMAL LOW (ref 26–217)
Total Protein ELP: 6.3 g/dL (ref 6.0–8.5)

## 2022-03-25 DIAGNOSIS — Z992 Dependence on renal dialysis: Secondary | ICD-10-CM | POA: Diagnosis not present

## 2022-03-25 DIAGNOSIS — N2581 Secondary hyperparathyroidism of renal origin: Secondary | ICD-10-CM | POA: Diagnosis not present

## 2022-03-25 DIAGNOSIS — N186 End stage renal disease: Secondary | ICD-10-CM | POA: Diagnosis not present

## 2022-03-25 DIAGNOSIS — N250 Renal osteodystrophy: Secondary | ICD-10-CM | POA: Diagnosis not present

## 2022-03-27 ENCOUNTER — Non-Acute Institutional Stay (SKILLED_NURSING_FACILITY): Payer: Medicare PPO | Admitting: Adult Health

## 2022-03-27 ENCOUNTER — Encounter: Payer: Self-pay | Admitting: Adult Health

## 2022-03-27 DIAGNOSIS — N186 End stage renal disease: Secondary | ICD-10-CM | POA: Diagnosis not present

## 2022-03-27 DIAGNOSIS — N2581 Secondary hyperparathyroidism of renal origin: Secondary | ICD-10-CM | POA: Diagnosis not present

## 2022-03-27 DIAGNOSIS — N250 Renal osteodystrophy: Secondary | ICD-10-CM | POA: Diagnosis not present

## 2022-03-27 DIAGNOSIS — C9000 Multiple myeloma not having achieved remission: Secondary | ICD-10-CM | POA: Diagnosis not present

## 2022-03-27 DIAGNOSIS — Z992 Dependence on renal dialysis: Secondary | ICD-10-CM | POA: Diagnosis not present

## 2022-03-27 DIAGNOSIS — I700 Atherosclerosis of aorta: Secondary | ICD-10-CM | POA: Diagnosis not present

## 2022-03-27 NOTE — Progress Notes (Unsigned)
Location:  [place] Room Number: NO/148/D Place of Service:  SNF (31)   CODE STATUS: dnr   Allergies  Allergen Reactions   Ace Inhibitors Other (See Comments) and Cough    Tongue swell , ie angioedema   Angiotensin Receptor Blockers     Angioedema with ACE-I   Penicillins Other (See Comments)    Unsure of reaction Has patient had a PCN reaction causing immediate rash, facial/tongue/throat swelling, SOB or lightheadedness with hypotension: Unknown Has patient had a PCN reaction causing severe rash involving mucus membranes or skin necrosis: Unknown Has patient had a PCN reaction that required hospitalization: No Has patient had a PCN reaction occurring within the last 10 years: Unknown If all of the above answers are "NO", then may proceed with Cephalosporin use.     Penicillin G     Chief Complaint  Patient presents with   Acute Visit    Patient is being seen for care plan meeting    HPI:  We have come together for her care plan meeting. Family present . BIMS: 13/15 mood: 3/30 decreased energy. She is nonambulatory with no falls. She requires moderate assist with her adls. She is frequently incontinent of bladder and bowel. Has hemodialysis three days per week. Dietary:  weight is 1384 pounds. She is on a D2 diet appetite 50-75%; feeds self. Therapy: none at this time . Activities:religious . She continues to be followed for her chronic illnesses including: Dependence on renal dialysis  Aortic atherosclerosis  Multiple myeloma not having achieved remission   Past Medical History:  Diagnosis Date   Anemia     chronic macrocytic anemia   Anxiety    Chronic kidney disease    Chronic renal disease, stage 4, severely decreased glomerular filtration rate (GFR) between 15-29 mL/min/1.73 square meter (HCC) 16/12/9602   Complication of anesthesia    delirious after Breast Surgery   Dementia ([place])    mild   Depression    Diabetes mellitus with ESRD  (end-stage renal disease) ([place])    type II   Dysphagia    Dyspnea    with activity   GERD (gastroesophageal reflux disease)    Glaucoma    Hyperlipidemia    Hypertension    Multiple myeloma (HCC)    Pneumonia    Stage 1 infiltrating ductal carcinoma of right female breast ([place]) 08/21/2015   ER+ PR+ HER 2 neu + (3+) T1cN0     Past Surgical History:  Procedure Laterality Date   A/V FISTULAGRAM Right 07/05/2020   Procedure: A/V Fistulagram;  Surgeon: Elam Dutch, MD;  Location: [place] [facility];  Service: Cardiovascular;  Laterality: Right;   AV FISTULA PLACEMENT Left 11/22/2017   Procedure: ARTERIOVENOUS (AV) FISTULA CREATION LEFT ARM;  Surgeon: Elam Dutch, MD;  Location[facility];  Service: Vascular;  Laterality: Left;   AV FISTULA PLACEMENT Right 04/04/2020   Procedure: RIGHT ARM ARTERIOVENOUS FISTULA CREATION;  Surgeon: Rosetta Posner, MD;  [facility];  Service: Vascular;  Laterality: Right;   AV FISTULA PLACEMENT Right 08/20/2020   Procedure: ARTERIOVENOUS (AV) FISTULA LIGATION RIGHT ARM;  Surgeon: Elam Dutch, MD;  Location: [place];  Service: Vascular;  Laterality: Right;   BIOPSY  08/07/2016   Procedure: BIOPSY;  Surgeon: Daneil Dolin, MD;  [facility];  Service: Endoscopy;;  gastric ulcer biopsy   COLONOSCOPY     ESOPHAGOGASTRODUODENOSCOPY N/A 08/07/2016   LA Grade A esophagitis s/p dilation,  small hiatal hernia, multiple gastric ulcers and erosions, duodenal erosions s/p biopsy. Negative H.pylori    ESOPHAGOGASTRODUODENOSCOPY N/A 11/27/2016   normal esophagus, previously noted gastric ulcers completely healed, normal duodenum.    ESOPHAGOGASTRODUODENOSCOPY (EGD) WITH PROPOFOL N/A 07/23/2020   Procedure: ESOPHAGOGASTRODUODENOSCOPY (EGD) WITH PROPOFOL;  Surgeon: Rogene Houston, MD;  [facility];  Service: Endoscopy;  Laterality: N/A;   FISTULA SUPERFICIALIZATION Left 02/14/2018   Procedure: FISTULA SUPERFICIALIZATION LEFT ARM;   Surgeon: Angelia Mould, MD;  Location: [facility] OR;  Service: Vascular;  Laterality: Left;   FRACTURE SURGERY Right    ankle   HOT HEMOSTASIS  07/23/2020   Procedure: HOT HEMOSTASIS (ARGON PLASMA COAGULATION/BICAP);  Surgeon: Rogene Houston, MD;  [facility];  Service: Endoscopy;;   INTRAMEDULLARY (IM) NAIL INTERTROCHANTERIC Right 07/12/2020   Procedure: INTRAMEDULLARY (IM) NAIL INTERTROCHANTRIC;  Surgeon: Mordecai Rasmussen, MD;  [facility];  Service: Orthopedics;  Laterality: Right;   MALONEY DILATION N/A 08/07/2016   Procedure: Venia Minks DILATION;  Surgeon: Daneil Dolin, MD;  [facility];  Service: Endoscopy;  Laterality: N/A;   MASTECTOMY, PARTIAL Right    PERIPHERAL VASCULAR BALLOON ANGIOPLASTY Left 07/13/2019   Procedure: PERIPHERAL VASCULAR BALLOON ANGIOPLASTY;  Surgeon: Marty Heck, MD;  Location: [place] [facility];  Service: Cardiovascular;  Laterality: Left;  arm fistulogram   PERIPHERAL VASCULAR BALLOON ANGIOPLASTY Right 05/22/2020   Procedure: PERIPHERAL VASCULAR BALLOON ANGIOPLASTY;  Surgeon: Cherre Robins, MD;  Location: [place] [facility];  Service: Cardiovascular;  Laterality: Right;  arm fistula   PERIPHERAL VASCULAR BALLOON ANGIOPLASTY Right 07/05/2020   Procedure: PERIPHERAL VASCULAR BALLOON ANGIOPLASTY;  Surgeon: Elam Dutch, MD;  Location: [place] [facility];  Service: Cardiovascular;  Laterality: Right;  arm fistula   PORT-A-CATH REMOVAL Left 11/22/2017   Procedure: REMOVAL PORT-A-CATH LEFT CHEST;  Surgeon: Elam Dutch, MD;  Location: [place];  Service: Vascular;  Laterality: Left;   RETINAL DETACHMENT SURGERY Right    SCLEROTHERAPY  07/23/2020   Procedure: SCLEROTHERAPY;  Surgeon: Rogene Houston, MD;  [facility];  Service: Endoscopy;;   UPPER EXTREMITY VENOGRAPHY N/A 08/22/2020   Procedure: LEFT UPPER & CENTRAL VENOGRAPHY;  Surgeon: Marty Heck, MD;  Location: [place] [facility];  Service:  Cardiovascular;  Laterality: N/A;    Social History   Socioeconomic History   Marital status: Single    Spouse name: Not on file   Number of children: Not on file   Years of education: Not on file   Highest education level: Not on file  Occupational History   Occupation: retired   Tobacco Use   Smoking status: Never   Smokeless tobacco: Never  Vaping Use   Vaping Use: Never used  Substance and Sexual Activity   Alcohol use: No    Alcohol/week: 0.0 standard drinks of alcohol   Drug use: No   Sexual activity: Never  Other Topics Concern   Not on file  Social History Narrative   Long term resident of SNF    Social Determinants of Health   Financial Resource Strain: [place]  (01/09/2020)   Overall Financial Resource Strain (CARDIA)    Difficulty of Paying Living Expenses: Not very hard  Food Insecurity: No Food Insecurity (01/09/2020)   Hunger Vital Sign    Worried About Running Out of Food in the Last Year: Never true    Ran Out of Food in the Last Year: Never true  Transportation Needs:  No Transportation Needs (01/09/2020)   PRAPARE - Hydrologist (Medical): No    Lack of Transportation (Non-Medical): No  Physical Activity: Inactive (01/09/2020)   Exercise Vital Sign    Days of Exercise per Week: 0 days    Minutes of Exercise per Session: 0 min  Stress: No Stress Concern Present (01/09/2020)   [place]    Feeling of Stress : Not at all  Social Connections: Moderately Isolated (01/09/2020)   Social Connection and Isolation Panel [NHANES]    Frequency of Communication with Friends and Family: More than three times a week    Frequency of Social Gatherings with Friends and Family: Once a week    Attends Religious Services: More than 4 times per year    Active Member of [employer] or Organizations: No    Attends Archivist Meetings: Never    Marital Status: Never married   Intimate Partner Violence: Not At Risk (01/09/2020)   Humiliation, Afraid, Rape, and Kick questionnaire    Fear of Current or Ex-Partner: No    Emotionally Abused: No    Physically Abused: No    Sexually Abused: No   Family History  Problem Relation Age of Onset   Multiple myeloma Sister    Brain cancer Sister    Dementia Mother        died at 41   Stroke Mother    Heart failure Mother    Diabetes Mother    Heart disease Father    Prostate cancer Brother    Colon cancer Neg Hx       VITAL SIGNS BP (!) 147/82   Pulse 95   Temp 98.1 F (36.7 C)   Resp 20   Ht '5\' 3"'$  (1.6 m)   Wt 138 lb 6.4 oz (62.8 kg)   SpO2 97%   BMI 24.52 kg/m   Outpatient Encounter Medications as of 03/27/2022  Medication Sig   acetaminophen (TYLENOL) 325 MG tablet Take 650 mg by mouth every 8 (eight) hours.   acyclovir (ZOVIRAX) 200 MG capsule Take 200 mg by mouth in the morning. (0800)   Amino Acids-Protein Hydrolys (FEEDING SUPPLEMENT, PRO-STAT SUGAR FREE 64,) LIQD Take 30 mLs by mouth 3 (three) times daily with meals. (0800, 1200 & 1800)   anastrozole (ARIMIDEX) 1 MG tablet TAKE 1 TABLET BY MOUTH DAILY   calcium-vitamin D (OSCAL WITH D) 500-200 MG-UNIT tablet Take 1 tablet by mouth in the morning. (0800)   denosumab (PROLIA) 60 MG/ML SOSY injection Inject 60 mg into the skin every 6 (six) months.   docusate sodium (COLACE) 100 MG capsule Take 100 mg by mouth 2 (two) times daily.   Insulin Pen Needle (BD AUTOSHIELD DUO) 30G X 5 MM MISC by Does not apply route. 3/16"   melatonin 3 MG TABS tablet Take 6 mg by mouth at bedtime. (2000) For Sleep   multivitamin (RENA-VIT) TABS tablet Take 1 tablet by mouth at bedtime. (2100)   ondansetron (ZOFRAN) 4 MG tablet Take 1 tablet (4 mg total) by mouth every 6 (six) hours as needed for nausea.   ondansetron (ZOFRAN-ODT) 4 MG disintegrating tablet Take by mouth daily.   pantoprazole (PROTONIX) 40 MG tablet Take 40 mg by mouth 2 (two) times daily.   sertraline  (ZOLOFT) 25 MG tablet Take 25 mg by mouth daily. At 9 am along with 50 mg tablet for a total of 75 mg daily   sertraline (  ZOLOFT) 50 MG tablet Take 50 mg by mouth daily. At 9 am along with 25 mg tablet for a total of 75 mg daily   sucralfate (CARAFATE) 1 GM/10ML suspension Take 1 g by mouth in the morning, at noon, in the evening, and at bedtime. (0800, 1200, 1700 & 2100)   Facility-Administered Encounter Medications as of 03/27/2022  Medication   lanreotide acetate (SOMATULINE DEPOT) 120 MG/0.5ML injection   lanreotide acetate (SOMATULINE DEPOT) 120 MG/0.5ML injection   lanreotide acetate (SOMATULINE DEPOT) 120 MG/0.5ML injection   octreotide (SANDOSTATIN LAR) 30 MG IM injection   octreotide (SANDOSTATIN LAR) 30 MG IM injection     SIGNIFICANT DIAGNOSTIC EXAMS  PREVIOUS   10-01-21: chest x-ray: No evidence of active cardiopulmonary process.   10-01-21: abdominal ultrasound:  1. Cholelithiasis with no evidence of acute cholecystitis. 2. Increased echogenicity of the liver parenchyma, findings suggestive of hepatic steatosis.  10-01-21: ct of chest abdomen and pelvis  No aspiration pneumonia. Mild focal scarring at the medial left lung base.   Coronary artery calcification. Aortic atherosclerotic calcification. Aortic Atherosclerosis  Probably acute superior endplate fractures at Y30, L1 and L2. No retropulsed bone.   Renal atrophy. Multiple renal cysts. Indeterminate 2.7 cm lesion at the lower pole the right kidney could be a complicated cyst or a mass. This could be evaluated with renal MRI. 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 Nonobstructing 5 mm stone in the lower pole of the left kidney.   1 x 2 cm cystic abnormality in the ventral head of the pancreas. Best practice recommendation is for reimaging of this every 6 months for 2 years and then every 1 year for 2 years and then every 2 years for 6 years. Follow-up can be discontinued if stable for 10 years. This recommendation could  be tailored to this patient's clinical status and life expectancy.  Paget's disease of the right hemipelvis. Diverticulosis without evidence of diverticulitis. Small hiatal hernia. Multiple calcified leiomyomas the uterus.  10-24-21: DEXA: t score -2.957  NO NEW EXAMS      LABS REVIEWED PREVIOUS   03-20-21: wbc 5.4; hgb 12.1; hct 37.7; mcv 97.2 plt 254; glucose 111; bun 47; creat 7.34; k+ 4.6; na++ 131; ca 9.6 GFR 5; liver normal albumin 3.9; mag 2.2 03-27-21: hgb a1c 6.1   04-29-21; wbc 5.4; hgb 11.0; hct 34.6;mcv 100.9 plt 179; glucose 82; bun 44; creat 7.04; k+ 4.6; na++ 139; ca 8.7; GFR 5 d-dimer: 1.25; CRP 4.0   05-27-21: wbc 6.0; hgb 10.6; hct 33.5; mcv 101.2 plt 261; glucose 147; bun 45; creat 6.86; k+ 4.5; na++ 138; ca 8.3; GFR 6; protein 6.9; albumin 3.8  06-12-21: wbc 6.5; hgb 8.8; hct 27.7; mcv 101.5 plt 229; glucose 153; bun 40; creat 6.53; k+ 4.3; na++ 136; ca 9.5; GFR 6 protein 6.5; albumin 3.4 mag 2.2  07-07-21: chol 171; ldl 89; trig 265; hdl 29  07-08-21: wbc 15.7; hgb 9.8; hct 32.8; mcv 104.8; plt 226; glucose 164; bun 51; creat 7.74; k+ 4.4; na++ 138; ca 9.7; gfr 5 protein 7.5; albumin 3.9 mag 2.1; chol 171; ldl 89; trig 265; hdl 29; hgb a1c 6.1 08-12-21: wbc 6.3; hgb 10.0; hct 31.9; mcv 103.9 plt 215; glucose 197; bun 40;creat 6.25; k+ 4.2; na++138; ca 9.3; gfr 6; protein 6.7; albumin 3.7; iron 93; tibc 142; ferritin 312 09-30-21: wbc 6.6; hgb 9.6; hct 31.1; mcv 101.3 plt 232; glucose 222; bun 40; creat 6.78; k+ 4.0; na++ 136; ca 9.2; gfr 6; protein  6.8 albumin 3.7  10-01-21: wbc 13.0; hgb 9.1; hct 28.8; mcv 99.3 plt 251; glucose 198; bun 59; creat 9.01; k+ 3.7; na++ 135; ca 9.6; gfr 4; ast 212; alt 173; protein 6.7; albumin 3.6  10-03-21: wbc 7.2; hgb 8.6; hct 27.5; mcv 99.6 plt 179 glucose 105; bun 34; creat 7.04; k+ 3.6; na++ 135; ca 8.8; gfr 5 ast 45; alt 99; protein 6.5; albumin 3.5  10-10-21: hgb a1c 5.3  11-25-21: wbc 4.9; hgb 9.4; hct 30.7; mcv 100.7 plt 237; glucose 171; bun 27;  creat 6.88; k+ 4.4; na++ 137; ca 9.0; gfr 6; protein 6.4 albumin 3.5  12-16-21: wbc 5.3; hgb 10.1; hct 33.0 mcv 98.5 plt 256; glucose 119; bun 24; creat 6.62; k+ 4.2 na++ 136; ca 9.3 gfr 6; protein 6.4 albumin 3.5; iron 54; tibc 140; ferritin 272  02-03-22: wbc 3.8; hgb 11.5; hct 37.6; mcv 96.4 plt 212; glucose 164; bun 28; creat 7.99; k+ 4.4; na++ 139; ca 8.3 gfr 5; protein 6.7 albumin 3.7 mag 2.1 02-17-22: wbc 4.6; hgb 11.5; hct 36.5; mcv 93.6 plt 207; glucose 95; bun 23 creat 7.44 k+ 4.1; na++ 135; ca 7.8; gf 5 protein 6.9; albumin 3.8  NO NEW LABS.   Review of Systems  Constitutional:  Negative for malaise/fatigue.  Respiratory:  Negative for cough and shortness of breath.   Cardiovascular:  Negative for chest pain, palpitations and leg swelling.  Gastrointestinal:  Negative for abdominal pain, constipation and heartburn.  Musculoskeletal:  Negative for back pain, joint pain and myalgias.  Skin: Negative.   Neurological:  Negative for dizziness.  Psychiatric/Behavioral:  The patient is not nervous/anxious.    Physical Exam Constitutional:      General: She is not in acute distress.    Appearance: She is well-developed. She is not diaphoretic.  Neck:     Thyroid: No thyromegaly.  Cardiovascular:     Rate and Rhythm: Normal rate and regular rhythm.     Pulses: Normal pulses.     Heart sounds: Normal heart sounds.  Pulmonary:     Effort: Pulmonary effort is normal. No respiratory distress.     Breath sounds: Normal breath sounds.  Abdominal:     General: Bowel sounds are normal. There is no distension.     Palpations: Abdomen is soft.     Tenderness: There is no abdominal tenderness.  Musculoskeletal:        General: Normal range of motion.     Cervical back: Neck supple.     Right lower leg: No edema.     Left lower leg: No edema.  Lymphadenopathy:     Cervical: No cervical adenopathy.  Skin:    General: Skin is warm and dry.     Comments: Chest wall dialysis access    Neurological:     Mental Status: She is alert. Mental status is at baseline.  Psychiatric:        Mood and Affect: Mood normal.       ASSESSMENT/ PLAN:  TODAY  Dependence on renal dialysis Aortic atherosclerosis Multiple myeloma not having achieved remission  Will continue current medications Will continue current plan of care Will continue to monitor her status   Time spent with patient: 40 minutes: medications; plan of care dietary    Ok Edwards NP [facility]  call [phone]

## 2022-03-30 ENCOUNTER — Other Ambulatory Visit: Payer: Self-pay

## 2022-03-30 DIAGNOSIS — N186 End stage renal disease: Secondary | ICD-10-CM | POA: Diagnosis not present

## 2022-03-30 DIAGNOSIS — N2581 Secondary hyperparathyroidism of renal origin: Secondary | ICD-10-CM | POA: Diagnosis not present

## 2022-03-30 DIAGNOSIS — N250 Renal osteodystrophy: Secondary | ICD-10-CM | POA: Diagnosis not present

## 2022-03-30 DIAGNOSIS — C9000 Multiple myeloma not having achieved remission: Secondary | ICD-10-CM

## 2022-03-30 DIAGNOSIS — Z992 Dependence on renal dialysis: Secondary | ICD-10-CM | POA: Diagnosis not present

## 2022-03-31 ENCOUNTER — Inpatient Hospital Stay: Payer: Medicare PPO

## 2022-03-31 ENCOUNTER — Inpatient Hospital Stay (HOSPITAL_BASED_OUTPATIENT_CLINIC_OR_DEPARTMENT_OTHER): Payer: Medicare PPO | Admitting: Hematology

## 2022-03-31 VITALS — BP 131/74 | HR 88 | Temp 98.5°F | Resp 17

## 2022-03-31 DIAGNOSIS — Z5112 Encounter for antineoplastic immunotherapy: Secondary | ICD-10-CM | POA: Diagnosis not present

## 2022-03-31 DIAGNOSIS — C50911 Malignant neoplasm of unspecified site of right female breast: Secondary | ICD-10-CM | POA: Diagnosis not present

## 2022-03-31 DIAGNOSIS — C9000 Multiple myeloma not having achieved remission: Secondary | ICD-10-CM | POA: Diagnosis not present

## 2022-03-31 DIAGNOSIS — M8580 Other specified disorders of bone density and structure, unspecified site: Secondary | ICD-10-CM | POA: Diagnosis not present

## 2022-03-31 DIAGNOSIS — Z170 Estrogen receptor positive status [ER+]: Secondary | ICD-10-CM | POA: Diagnosis not present

## 2022-03-31 DIAGNOSIS — Z79811 Long term (current) use of aromatase inhibitors: Secondary | ICD-10-CM | POA: Diagnosis not present

## 2022-03-31 LAB — CBC WITH DIFFERENTIAL/PLATELET
Abs Immature Granulocytes: 0.01 10*3/uL (ref 0.00–0.07)
Basophils Absolute: 0 10*3/uL (ref 0.0–0.1)
Basophils Relative: 1 %
Eosinophils Absolute: 0.2 10*3/uL (ref 0.0–0.5)
Eosinophils Relative: 3 %
HCT: 32.2 % — ABNORMAL LOW (ref 36.0–46.0)
Hemoglobin: 10 g/dL — ABNORMAL LOW (ref 12.0–15.0)
Immature Granulocytes: 0 %
Lymphocytes Relative: 18 %
Lymphs Abs: 0.8 10*3/uL (ref 0.7–4.0)
MCH: 29.9 pg (ref 26.0–34.0)
MCHC: 31.1 g/dL (ref 30.0–36.0)
MCV: 96.4 fL (ref 80.0–100.0)
Monocytes Absolute: 0.5 10*3/uL (ref 0.1–1.0)
Monocytes Relative: 10 %
Neutro Abs: 3.2 10*3/uL (ref 1.7–7.7)
Neutrophils Relative %: 68 %
Platelets: 278 10*3/uL (ref 150–400)
RBC: 3.34 MIL/uL — ABNORMAL LOW (ref 3.87–5.11)
RDW: 17.4 % — ABNORMAL HIGH (ref 11.5–15.5)
WBC: 4.7 10*3/uL (ref 4.0–10.5)
nRBC: 0 % (ref 0.0–0.2)

## 2022-03-31 LAB — COMPREHENSIVE METABOLIC PANEL
ALT: 11 U/L (ref 0–44)
AST: 18 U/L (ref 15–41)
Albumin: 3.6 g/dL (ref 3.5–5.0)
Alkaline Phosphatase: 54 U/L (ref 38–126)
Anion gap: 13 (ref 5–15)
BUN: 28 mg/dL — ABNORMAL HIGH (ref 8–23)
CO2: 26 mmol/L (ref 22–32)
Calcium: 8 mg/dL — ABNORMAL LOW (ref 8.9–10.3)
Chloride: 93 mmol/L — ABNORMAL LOW (ref 98–111)
Creatinine, Ser: 6.5 mg/dL — ABNORMAL HIGH (ref 0.44–1.00)
GFR, Estimated: 6 mL/min — ABNORMAL LOW (ref >60)
Glucose, Bld: 155 mg/dL — ABNORMAL HIGH (ref 70–99)
Potassium: 4 mmol/L (ref 3.5–5.1)
Sodium: 132 mmol/L — ABNORMAL LOW (ref 135–145)
Total Bilirubin: 0.5 mg/dL (ref 0.3–1.2)
Total Protein: 6.9 g/dL (ref 6.5–8.1)

## 2022-03-31 LAB — MAGNESIUM: Magnesium: 2.4 mg/dL (ref 1.7–2.4)

## 2022-03-31 MED ORDER — BORTEZOMIB CHEMO SQ INJECTION 3.5 MG (2.5MG/ML)
1.2000 mg/m2 | Freq: Once | INTRAMUSCULAR | Status: AC
  Administered 2022-03-31: 2 mg via SUBCUTANEOUS
  Filled 2022-03-31: qty 0.8

## 2022-03-31 MED ORDER — PROCHLORPERAZINE MALEATE 10 MG PO TABS
10.0000 mg | ORAL_TABLET | Freq: Once | ORAL | Status: AC
  Administered 2022-03-31: 10 mg via ORAL
  Filled 2022-03-31: qty 1

## 2022-03-31 NOTE — Progress Notes (Signed)
[place], [place] [id number]   CLINIC:  Medical Oncology/Hematology  PCP:  Gerlene Fee, NP [address] [place] [address] [phone]   REASON FOR VISIT:  Follow-up for plasma cell myeloma and anemia  PRIOR THERAPY: none  NGS Results: not done  CURRENT THERAPY: Velcade & Decadron 3/4 weeks  BRIEF ONCOLOGIC HISTORY:  Oncology History  Stage 1 infiltrating ductal carcinoma of right female breast ([place])  09/12/2014 Mammogram   Mass in upper outer R breast, middle third depth appears slightly larger than it was on prior exam with more irreg spiculated margins   10/02/2014 Pathology Results   biopsy with invasive ductal carcinoma high grade 1.1 cm, dcis solid type. additional R breast tissue, excision, invasive ductal high grade 0.9 cm    10/24/2014 Pathology Results   no residual invasive carcinoma, DCIS, focal, atypical ductal hyperplasia, 0/7 LN positive for metastatic carcinoma ER > 90%, PR 30%, HER 2 2+   10/24/2014 Cancer Staging   T1cN0M0   10/24/2014 Surgery   R mastectomy, T1c, N0   12/28/2014 - 11/22/2015 Chemotherapy   Taxol/herceptin weekly X 12, Herceptin every 21 days, last due in September 2017   03/11/2015 -  Anti-estrogen oral therapy   Arimidex 1 mg daily   09/12/2015 Imaging   MUGA- The left ventricular ejection fraction equals 68%.   06/08/2016 Treatment Plan Change   Started Nerlynx   Multiple myeloma ([place])  10/04/2018 Initial Diagnosis   Multiple myeloma ([place])   10/11/2018 - 10/28/2021 Chemotherapy   Patient is on Treatment Plan : MYELOMA NON-TRANSPLANT CANDIDATES VRd weekly d21d      11/25/2021 -  Chemotherapy   Patient is on Treatment Plan : MYELOMA MAINTENANCE Bortezomib SQ q14d       CANCER STAGING:  Cancer Staging  Stage 1 infiltrating ductal carcinoma of right female breast [facility]) Staging form: Breast, AJCC 7th Edition - Clinical stage from 08/30/2015: Stage IA (T1c, N0, M0) - Signed by  Baird Cancer, PA-C on 08/30/2015   INTERVAL HISTORY:  Ms. Doree Kuehne Hornstein, a 87 y.o. female, seen for follow-up of multiple myeloma and toxicity assessment prior to maintenance Velcade treatment.  She does not report any diarrhea.  She reports occasional numbness in the hands at nighttime.  No recent infections or hospitalizations.  REVIEW OF SYSTEMS:  Review of Systems  Constitutional:  Negative for appetite change and fatigue.  Respiratory:  Negative for cough.   Neurological:  Positive for numbness (On and off numbness in the hands at night).  All other systems reviewed and are negative.   PAST MEDICAL/SURGICAL HISTORY:  Past Medical History:  Diagnosis Date   Anemia     chronic macrocytic anemia   Anxiety    Chronic kidney disease    Chronic renal disease, stage 4, severely decreased glomerular filtration rate (GFR) between 15-29 mL/min/1.73 square meter ([place]) 09/98/3382   Complication of anesthesia    delirious after Breast Surgery   Dementia ([place])    mild   Depression    Diabetes mellitus with ESRD (end-stage renal disease) ([place])    type II   Dysphagia    Dyspnea    with activity   GERD (gastroesophageal reflux disease)    Glaucoma    Hyperlipidemia    Hypertension    Multiple myeloma ([place])    Pneumonia    Stage 1 infiltrating ductal carcinoma of right female breast ([place]) 08/21/2015   ER+ PR+ HER 2  neu + (3+) T1cN0    Past Surgical History:  Procedure Laterality Date   A/V FISTULAGRAM Right 07/05/2020   Procedure: A/V Fistulagram;  Surgeon: Elam Dutch, MD;  Location: [facility];  Service: Cardiovascular;  Laterality: Right;   AV FISTULA PLACEMENT Left 11/22/2017   Procedure: ARTERIOVENOUS (AV) FISTULA CREATION LEFT ARM;  Surgeon: Elam Dutch, MD;  Location: [facility] OR;  Service: Vascular;  Laterality: Left;   AV FISTULA PLACEMENT Right 04/04/2020   Procedure: RIGHT ARM ARTERIOVENOUS FISTULA CREATION;  Surgeon: Rosetta Posner, MD;  [facility];  Service: Vascular;  Laterality: Right;   AV FISTULA PLACEMENT Right 08/20/2020   Procedure: ARTERIOVENOUS (AV) FISTULA LIGATION RIGHT ARM;  Surgeon: Elam Dutch, MD;  Location: [facility];  Service: Vascular;  Laterality: Right;   BIOPSY  08/07/2016   Procedure: BIOPSY;  Surgeon: Daneil Dolin, MD;  [facility];  Service: Endoscopy;;  gastric ulcer biopsy   COLONOSCOPY     ESOPHAGOGASTRODUODENOSCOPY N/A 08/07/2016   LA Grade A esophagitis s/p dilation, small hiatal hernia, multiple gastric ulcers and erosions, duodenal erosions s/p biopsy. Negative H.pylori    ESOPHAGOGASTRODUODENOSCOPY N/A 11/27/2016   normal esophagus, previously noted gastric ulcers completely healed, normal duodenum.    ESOPHAGOGASTRODUODENOSCOPY (EGD) WITH PROPOFOL N/A 07/23/2020   Procedure: ESOPHAGOGASTRODUODENOSCOPY (EGD) WITH PROPOFOL;  Surgeon: Rogene Houston, MD;  [facility];  Service: Endoscopy;  Laterality: N/A;   FISTULA SUPERFICIALIZATION Left 02/14/2018   Procedure: FISTULA SUPERFICIALIZATION LEFT ARM;  Surgeon: Angelia Mould, MD;  Location: [facility] OR;  Service: Vascular;  Laterality: Left;   FRACTURE SURGERY Right    ankle   HOT HEMOSTASIS  07/23/2020   Procedure: HOT HEMOSTASIS (ARGON PLASMA COAGULATION/BICAP);  Surgeon: Rogene Houston, MD;  [facility];  Service: Endoscopy;;   INTRAMEDULLARY (IM) NAIL INTERTROCHANTERIC Right 07/12/2020   Procedure: INTRAMEDULLARY (IM) NAIL INTERTROCHANTRIC;  Surgeon: Mordecai Rasmussen, MD;  Location: [facility] ORS;  Service: Orthopedics;  Laterality: Right;   MALONEY DILATION N/A 08/07/2016   Procedure: Venia Minks DILATION;  Surgeon: Daneil Dolin, MD;  [facility];  Service: Endoscopy;  Laterality: N/A;   MASTECTOMY, PARTIAL Right    PERIPHERAL VASCULAR BALLOON ANGIOPLASTY Left 07/13/2019   Procedure: PERIPHERAL VASCULAR BALLOON ANGIOPLASTY;  Surgeon: Marty Heck, MD;  Location: [facility] [facility];  Service:  Cardiovascular;  Laterality: Left;  arm fistulogram   PERIPHERAL VASCULAR BALLOON ANGIOPLASTY Right 05/22/2020   Procedure: PERIPHERAL VASCULAR BALLOON ANGIOPLASTY;  Surgeon: Cherre Robins, MD;  Location: [place];  Service: Cardiovascular;  Laterality: Right;  arm fistula   PERIPHERAL VASCULAR BALLOON ANGIOPLASTY Right 07/05/2020   Procedure: PERIPHERAL VASCULAR BALLOON ANGIOPLASTY;  Surgeon: Elam Dutch, MD;  Location: [facility];  Service: Cardiovascular;  Laterality: Right;  arm fistula   PORT-A-CATH REMOVAL Left 11/22/2017   Procedure: REMOVAL PORT-A-CATH LEFT CHEST;  Surgeon: Elam Dutch, MD;  Location: [facility];  Service: Vascular;  Laterality: Left;   RETINAL DETACHMENT SURGERY Right    SCLEROTHERAPY  07/23/2020   Procedure: SCLEROTHERAPY;  Surgeon: Rogene Houston, MD;  [facility];  Service: Endoscopy;;   UPPER EXTREMITY VENOGRAPHY N/A 08/22/2020   Procedure: LEFT UPPER & CENTRAL VENOGRAPHY;  Surgeon: Marty Heck, MD;  Location: [place];  Service: Cardiovascular;  Laterality: N/A;    SOCIAL HISTORY:  Social History   Socioeconomic History   Marital status: Single  Spouse name: Not on file   Number of children: Not on file   Years of education: Not on file   Highest education level: Not on file  Occupational History   Occupation: retired   Tobacco Use   Smoking status: Never   Smokeless tobacco: Never  Vaping Use   Vaping Use: Never used  Substance and Sexual Activity   Alcohol use: No    Alcohol/week: 0.0 standard drinks of alcohol   Drug use: No   Sexual activity: Never  Other Topics Concern   Not on file  Social History Narrative   Long term resident of SNF    Social Determinants of Health   Financial Resource Strain: [place]  (01/09/2020)   Overall Financial Resource Strain (CARDIA)    Difficulty of Paying Living Expenses: Not very hard  Food Insecurity: No Food Insecurity (01/09/2020)   Hunger  Vital Sign    Worried About Running Out of Food in the Last Year: Never true    Ran Out of Food in the Last Year: Never true  Transportation Needs: No Transportation Needs (01/09/2020)   PRAPARE - Hydrologist (Medical): No    Lack of Transportation (Non-Medical): No  Physical Activity: Inactive (01/09/2020)   Exercise Vital Sign    Days of Exercise per Week: 0 days    Minutes of Exercise per Session: 0 min  Stress: No Stress Concern Present (01/09/2020)   [place]    Feeling of Stress : Not at all  Social Connections: Moderately Isolated (01/09/2020)   Social Connection and Isolation Panel [NHANES]    Frequency of Communication with Friends and Family: More than three times a week    Frequency of Social Gatherings with Friends and Family: Once a week    Attends Religious Services: More than 4 times per year    Active Member of [employer] or Organizations: No    Attends Archivist Meetings: Never    Marital Status: Never married  Intimate Partner Violence: Not At Risk (01/09/2020)   Humiliation, Afraid, Rape, and Kick questionnaire    Fear of Current or Ex-Partner: No    Emotionally Abused: No    Physically Abused: No    Sexually Abused: No    FAMILY HISTORY:  Family History  Problem Relation Age of Onset   Multiple myeloma Sister    Brain cancer Sister    Dementia Mother        died at 45   Stroke Mother    Heart failure Mother    Diabetes Mother    Heart disease Father    Prostate cancer Brother    Colon cancer Neg Hx     CURRENT MEDICATIONS:  Current Outpatient Medications  Medication Sig Dispense Refill   acetaminophen (TYLENOL) 325 MG tablet Take 650 mg by mouth every 8 (eight) hours.     acyclovir (ZOVIRAX) 200 MG capsule Take 200 mg by mouth in the morning. (0800)     Amino Acids-Protein Hydrolys (FEEDING SUPPLEMENT, PRO-STAT SUGAR FREE 64,) LIQD Take 30 mLs by  mouth 3 (three) times daily with meals. (0800, 1200 & 1800)     anastrozole (ARIMIDEX) 1 MG tablet TAKE 1 TABLET BY MOUTH DAILY 30 tablet 6   calcium-vitamin D (OSCAL WITH D) 500-200 MG-UNIT tablet Take 1 tablet by mouth in the morning. (0800)     denosumab (PROLIA) 60 MG/ML SOSY injection Inject 60 mg into the skin  every 6 (six) months.     docusate sodium (COLACE) 100 MG capsule Take 100 mg by mouth 2 (two) times daily.     Insulin Pen Needle (BD AUTOSHIELD DUO) 30G X 5 MM MISC by Does not apply route. 3/16"     melatonin 3 MG TABS tablet Take 6 mg by mouth at bedtime. (2000) For Sleep     multivitamin (RENA-VIT) TABS tablet Take 1 tablet by mouth at bedtime. (2100)     ondansetron (ZOFRAN) 4 MG tablet Take 1 tablet (4 mg total) by mouth every 6 (six) hours as needed for nausea. 20 tablet 0   ondansetron (ZOFRAN-ODT) 4 MG disintegrating tablet Take by mouth daily.     pantoprazole (PROTONIX) 40 MG tablet Take 40 mg by mouth 2 (two) times daily.     sertraline (ZOLOFT) 25 MG tablet Take 25 mg by mouth daily. At 9 am along with 50 mg tablet for a total of 75 mg daily     sertraline (ZOLOFT) 50 MG tablet Take 50 mg by mouth daily. At 9 am along with 25 mg tablet for a total of 75 mg daily     sucralfate (CARAFATE) 1 GM/10ML suspension Take 1 g by mouth in the morning, at noon, in the evening, and at bedtime. (0800, 1200, 1700 & 2100)     No current facility-administered medications for this visit.   Facility-Administered Medications Ordered in Other Visits  Medication Dose Route Frequency Provider Last Rate Last Admin   lanreotide acetate (SOMATULINE DEPOT) 120 MG/0.5ML injection            lanreotide acetate (SOMATULINE DEPOT) 120 MG/0.5ML injection            lanreotide acetate (SOMATULINE DEPOT) 120 MG/0.5ML injection            octreotide (SANDOSTATIN LAR) 30 MG IM injection            octreotide (SANDOSTATIN LAR) 30 MG IM injection             ALLERGIES:  Allergies  Allergen Reactions    Ace Inhibitors Other (See Comments) and Cough    Tongue swell , ie angioedema   Angiotensin Receptor Blockers     Angioedema with ACE-I   Penicillins Other (See Comments)    Unsure of reaction Has patient had a PCN reaction causing immediate rash, facial/tongue/throat swelling, SOB or lightheadedness with hypotension: Unknown Has patient had a PCN reaction causing severe rash involving mucus membranes or skin necrosis: Unknown Has patient had a PCN reaction that required hospitalization: No Has patient had a PCN reaction occurring within the last 10 years: Unknown If all of the above answers are "NO", then may proceed with Cephalosporin use.     Penicillin G     PHYSICAL EXAM:  Performance status (ECOG): 2 - Symptomatic, <50% confined to bed  Vitals:   03/31/22 1300  BP: 131/74  Pulse: 88  Resp: 17  Temp: 98.5 F (36.9 C)  SpO2: 100%   Wt Readings from Last 3 Encounters:  03/27/22 138 lb 6.4 oz (62.8 kg)  03/17/22 131 lb 8 oz (59.6 kg)  03/05/22 139 lb 9.6 oz (63.3 kg)   Physical Exam Vitals reviewed.  Constitutional:      Appearance: Normal appearance.     Comments: In wheelchair  Cardiovascular:     Rate and Rhythm: Normal rate and regular rhythm.     Pulses: Normal pulses.     Heart sounds: Normal heart sounds.  Pulmonary:     Effort: Pulmonary effort is normal.     Breath sounds: Normal breath sounds.  Neurological:     General: No focal deficit present.     Mental Status: She is alert and oriented to person, place, and time.  Psychiatric:        Mood and Affect: Mood normal.        Behavior: Behavior normal.    LABORATORY DATA:  I have reviewed the labs as listed.     Latest Ref Rng & Units 03/31/2022   12:28 PM 03/17/2022   12:51 PM 02/17/2022   12:45 PM  CBC  WBC 4.0 - 10.5 K/uL 4.7  4.5  4.6   Hemoglobin 12.0 - 15.0 g/dL 10.0  11.0  11.5   Hematocrit 36.0 - 46.0 % 32.2  35.4  36.5   Platelets 150 - 400 K/uL 278  243  207       Latest Ref Rng  & Units 03/17/2022   12:51 PM 02/17/2022   12:45 PM 02/03/2022    1:02 PM  CMP  Glucose 70 - 99 mg/dL 133  95  164   BUN 8 - 23 mg/dL '27  23  28   '$ Creatinine 0.44 - 1.00 mg/dL 6.80  7.44  7.99   Sodium 135 - 145 mmol/L 135  135  139   Potassium 3.5 - 5.1 mmol/L 4.1  4.1  4.4   Chloride 98 - 111 mmol/L 97  98  99   CO2 22 - 32 mmol/L '25  22  28   '$ Calcium 8.9 - 10.3 mg/dL 7.7  7.8  8.3   Total Protein 6.5 - 8.1 g/dL 6.6  6.9  6.7   Total Bilirubin 0.3 - 1.2 mg/dL 0.2  0.2  0.3   Alkaline Phos 38 - 126 U/L 55  52  51   AST 15 - 41 U/L '17  15  20   '$ ALT 0 - 44 U/L '10  9  10     '$ DIAGNOSTIC IMAGING:  I have independently reviewed the scans and discussed with the patient. No results found.   ASSESSMENT:  1.  IgA lambda plasma cell myeloma, stage II, standard risk: -BMBX on 09/20/2018 shows plasma cell myeloma, 30% plasma cells.  FISH panel was normal.  Chromosome analysis normal. -Labs at diagnosis on 08/29/2018 with M spike 0.9 g.  Kappa light chains 148, lambda light chains 1132, ratio 0.13.  LDH normal.  Beta-2 microglobulin 13.5.  She had transfusion dependent anemia. -Velcade and dexamethasone started on 10/11/2018. -Myeloma panel on 07/11/2019 shows SPEP is negative.  Free light chain ratio improved to 1.09.  Lambda light chains are 128, improved from 141.  Kappa light chains are 140. -She has been resident at [facility] since 10/12/2019.   2.  Stage I IDC of the right breast, ER/PR positive, HER-2 negative: -She is on anastrozole.   PLAN:  1.  IgA lambda plasma cell myeloma: - She is tolerating Velcade every other week very well.  She also receives dexamethasone 10 mg on days of Velcade. - Reviewed myeloma labs from 03/17/2022.  M spike resurfaced at 0.2 g.  Lambda light chains are slightly high at 482 but with normal ratio.  Immunofixation shows IgA lambda. - CBC today was grossly normal with mild anemia.  LFTs are normal. - I will continue maintenance Velcade every 2 weeks at this  time.  Will repeat myeloma panel in 3 months.  If M spike continues  to get worse, will consider intensifying Velcade with 3 weeks on/1 week off and dexamethasone.   2.  ESRD on HD: - Continue HD on Monday, Wednesday and Friday.   3.  Bone strengthening: - Bisphosphonates were not started due to patient being on dialysis.   4.  Osteopenia: - Last bone density was in 2017.  Will plan to repeat during this year.   5.  Right breast cancer: - Continue anastrozole.  She is tolerating well.   Orders placed this encounter:  No orders of the defined types were placed in this encounter.    Derek Jack, MD [place] [phone]

## 2022-03-31 NOTE — Patient Instructions (Signed)
[place] at [facility] Discharge Instructions   You were seen and examined today by Dr. Delton Coombes.  He reviewed the results of your lab work. It shows that the m-spike has gone up a little bit. For now we will just stick with the same treatment. If this number does not normalize the next time we check we will change treatment.   We will give your Velcade injection today.  Return as scheduled.    Thank you for choosing [place] at [facility] to provide your oncology and hematology care.  To afford each patient quality time with our provider, please arrive at least 15 minutes before your scheduled appointment time.   If you have a lab appointment with the [facility] please come in thru the Main Entrance and check in at the main information desk.  You need to re-schedule your appointment should you arrive 10 or more minutes late.  We strive to give you quality time with our providers, and arriving late affects you and other patients whose appointments are after yours.  Also, if you no show three or more times for appointments you may be dismissed from the clinic at the providers discretion.     Again, thank you for choosing [facility] ([place]).  Our hope is that these requests will decrease the amount of time that you wait before being seen by our physicians.       _____________________________________________________________  Should you have questions after your visit to [facility], please contact our office at [phone] and follow the prompts.  Our office hours are 8:00 a.m. and 4:30 p.m. Monday - Friday.  Please note that voicemails left after 4:00 p.m. may not be returned until the following business day.  We are closed weekends and major holidays.  You do have access to a nurse 24-7, just call the main number to the clinic [phone] and do not press any options, hold on the line and a nurse will answer the  phone.    For prescription refill requests, have your pharmacy contact our office and allow 72 hours.    Due to Covid, you will need to wear a mask upon entering the hospital. If you do not have a mask, a mask will be given to you at the Main Entrance upon arrival. For doctor visits, patients may have 1 support person age 67 or older with them. For treatment visits, patients can not have anyone with them due to social distancing guidelines and our immunocompromised population.

## 2022-03-31 NOTE — Progress Notes (Signed)
Pt presents today for Velcade per provider's order's. Vital signs and labs WNL for treatment. Okay to proceed with treatment today per Dr.K.  Velcade given today per MD orders. Tolerated infusion without adverse affects. Vital signs stable. No complaints at this time. Discharged from clinic via wheelchair in stable condition. Alert and oriented x 3. F/U with [facility] as scheduled.

## 2022-04-01 ENCOUNTER — Other Ambulatory Visit: Payer: Self-pay

## 2022-04-01 DIAGNOSIS — N250 Renal osteodystrophy: Secondary | ICD-10-CM | POA: Diagnosis not present

## 2022-04-01 DIAGNOSIS — Z992 Dependence on renal dialysis: Secondary | ICD-10-CM | POA: Diagnosis not present

## 2022-04-01 DIAGNOSIS — N186 End stage renal disease: Secondary | ICD-10-CM | POA: Diagnosis not present

## 2022-04-01 DIAGNOSIS — N2581 Secondary hyperparathyroidism of renal origin: Secondary | ICD-10-CM | POA: Diagnosis not present

## 2022-04-01 MED ORDER — LANREOTIDE ACETATE 120 MG/0.5ML ~~LOC~~ SOLN
SUBCUTANEOUS | Status: AC
  Filled 2022-04-01: qty 240

## 2022-04-02 ENCOUNTER — Other Ambulatory Visit: Payer: Self-pay

## 2022-04-03 DIAGNOSIS — N2581 Secondary hyperparathyroidism of renal origin: Secondary | ICD-10-CM | POA: Diagnosis not present

## 2022-04-03 DIAGNOSIS — N250 Renal osteodystrophy: Secondary | ICD-10-CM | POA: Diagnosis not present

## 2022-04-03 DIAGNOSIS — N186 End stage renal disease: Secondary | ICD-10-CM | POA: Diagnosis not present

## 2022-04-03 DIAGNOSIS — Z992 Dependence on renal dialysis: Secondary | ICD-10-CM | POA: Diagnosis not present

## 2022-04-06 DIAGNOSIS — Z992 Dependence on renal dialysis: Secondary | ICD-10-CM | POA: Diagnosis not present

## 2022-04-06 DIAGNOSIS — N2581 Secondary hyperparathyroidism of renal origin: Secondary | ICD-10-CM | POA: Diagnosis not present

## 2022-04-06 DIAGNOSIS — N250 Renal osteodystrophy: Secondary | ICD-10-CM | POA: Diagnosis not present

## 2022-04-06 DIAGNOSIS — N186 End stage renal disease: Secondary | ICD-10-CM | POA: Diagnosis not present

## 2022-04-07 ENCOUNTER — Non-Acute Institutional Stay (SKILLED_NURSING_FACILITY): Payer: Medicare PPO | Admitting: Adult Health

## 2022-04-07 ENCOUNTER — Encounter: Payer: Self-pay | Admitting: Adult Health

## 2022-04-07 DIAGNOSIS — I700 Atherosclerosis of aorta: Secondary | ICD-10-CM | POA: Diagnosis not present

## 2022-04-07 DIAGNOSIS — N186 End stage renal disease: Secondary | ICD-10-CM

## 2022-04-07 DIAGNOSIS — Z66 Do not resuscitate: Secondary | ICD-10-CM

## 2022-04-07 DIAGNOSIS — Z992 Dependence on renal dialysis: Secondary | ICD-10-CM | POA: Diagnosis not present

## 2022-04-07 DIAGNOSIS — E1122 Type 2 diabetes mellitus with diabetic chronic kidney disease: Secondary | ICD-10-CM

## 2022-04-07 DIAGNOSIS — F01518 Vascular dementia, unspecified severity, with other behavioral disturbance: Secondary | ICD-10-CM

## 2022-04-07 DIAGNOSIS — I120 Hypertensive chronic kidney disease with stage 5 chronic kidney disease or end stage renal disease: Secondary | ICD-10-CM | POA: Diagnosis not present

## 2022-04-07 DIAGNOSIS — I70209 Unspecified atherosclerosis of native arteries of extremities, unspecified extremity: Secondary | ICD-10-CM

## 2022-04-07 NOTE — Progress Notes (Unsigned)
Location:  [facility] Room Number: 148D Place of Service:  SNF (31)   CODE STATUS: DNR  Allergies  Allergen Reactions   Ace Inhibitors Other (See Comments) and Cough    Tongue swell , ie angioedema   Angiotensin Receptor Blockers     Angioedema with ACE-I   Penicillins Other (See Comments)    Unsure of reaction Has patient had a PCN reaction causing immediate rash, facial/tongue/throat swelling, SOB or lightheadedness with hypotension: Unknown Has patient had a PCN reaction causing severe rash involving mucus membranes or skin necrosis: Unknown Has patient had a PCN reaction that required hospitalization: No Has patient had a PCN reaction occurring within the last 10 years: Unknown If all of the above answers are "NO", then may proceed with Cephalosporin use.     Penicillin G     Chief Complaint  Patient presents with   Medical Management of Chronic Issues    Routine Visit    HPI:    Past Medical History:  Diagnosis Date   Anemia     chronic macrocytic anemia   Anxiety    Chronic kidney disease    Chronic renal disease, stage 4, severely decreased glomerular filtration rate (GFR) between 15-29 mL/min/1.73 square meter ([place]) 07/37/1062   Complication of anesthesia    delirious after Breast Surgery   Dementia ([place])    mild   Depression    Diabetes mellitus with ESRD (end-stage renal disease) ([place])    type II   Dysphagia    Dyspnea    with activity   GERD (gastroesophageal reflux disease)    Glaucoma    Hyperlipidemia    Hypertension    Multiple myeloma ([facility])    Pneumonia    Stage 1 infiltrating ductal carcinoma of right female breast ([place]) 08/21/2015   ER+ PR+ HER 2 neu + (3+) T1cN0     Past Surgical History:  Procedure Laterality Date   A/V FISTULAGRAM Right 07/05/2020   Procedure: A/V Fistulagram;  Surgeon: Elam Dutch, MD;  Location: [facility];  Service: Cardiovascular;  Laterality: Right;   AV FISTULA PLACEMENT  Left 11/22/2017   Procedure: ARTERIOVENOUS (AV) FISTULA CREATION LEFT ARM;  Surgeon: Elam Dutch, MD;  Location: [facility] OR;  Service: Vascular;  Laterality: Left;   AV FISTULA PLACEMENT Right 04/04/2020   Procedure: RIGHT ARM ARTERIOVENOUS FISTULA CREATION;  Surgeon: Rosetta Posner, MD;  [facility];  Service: Vascular;  Laterality: Right;   AV FISTULA PLACEMENT Right 08/20/2020   Procedure: ARTERIOVENOUS (AV) FISTULA LIGATION RIGHT ARM;  Surgeon: Elam Dutch, MD;  Location: [facility] OR;  Service: Vascular;  Laterality: Right;   BIOPSY  08/07/2016   Procedure: BIOPSY;  Surgeon: Daneil Dolin, MD;  [facility];  Service: Endoscopy;;  gastric ulcer biopsy   COLONOSCOPY     ESOPHAGOGASTRODUODENOSCOPY N/A 08/07/2016   LA Grade A esophagitis s/p dilation, small hiatal hernia, multiple gastric ulcers and erosions, duodenal erosions s/p biopsy. Negative H.pylori    ESOPHAGOGASTRODUODENOSCOPY N/A 11/27/2016   normal esophagus, previously noted gastric ulcers completely healed, normal duodenum.    ESOPHAGOGASTRODUODENOSCOPY (EGD) WITH PROPOFOL N/A 07/23/2020   Procedure: ESOPHAGOGASTRODUODENOSCOPY (EGD) WITH PROPOFOL;  Surgeon: Rogene Houston, MD;  [facility];  Service: Endoscopy;  Laterality: N/A;   FISTULA SUPERFICIALIZATION Left 02/14/2018   Procedure: FISTULA SUPERFICIALIZATION LEFT ARM;  Surgeon: Angelia Mould, MD;  Location: [facility];  Service: Vascular;  Laterality: Left;   FRACTURE SURGERY  Right    ankle   HOT HEMOSTASIS  07/23/2020   Procedure: HOT HEMOSTASIS (ARGON PLASMA COAGULATION/BICAP);  Surgeon: Rogene Houston, MD;  [facility];  Service: Endoscopy;;   INTRAMEDULLARY (IM) NAIL INTERTROCHANTERIC Right 07/12/2020   Procedure: INTRAMEDULLARY (IM) NAIL INTERTROCHANTRIC;  Surgeon: Mordecai Rasmussen, MD;  [facility];  Service: Orthopedics;  Laterality: Right;   MALONEY DILATION N/A 08/07/2016   Procedure: Venia Minks DILATION;  Surgeon:  Daneil Dolin, MD;  [facility];  Service: Endoscopy;  Laterality: N/A;   MASTECTOMY, PARTIAL Right    PERIPHERAL VASCULAR BALLOON ANGIOPLASTY Left 07/13/2019   Procedure: PERIPHERAL VASCULAR BALLOON ANGIOPLASTY;  Surgeon: Marty Heck, MD;  Location: [facility];  Service: Cardiovascular;  Laterality: Left;  arm fistulogram   PERIPHERAL VASCULAR BALLOON ANGIOPLASTY Right 05/22/2020   Procedure: PERIPHERAL VASCULAR BALLOON ANGIOPLASTY;  Surgeon: Cherre Robins, MD;  Location: [facility];  Service: Cardiovascular;  Laterality: Right;  arm fistula   PERIPHERAL VASCULAR BALLOON ANGIOPLASTY Right 07/05/2020   Procedure: PERIPHERAL VASCULAR BALLOON ANGIOPLASTY;  Surgeon: Elam Dutch, MD;  Location: [facility];  Service: Cardiovascular;  Laterality: Right;  arm fistula   PORT-A-CATH REMOVAL Left 11/22/2017   Procedure: REMOVAL PORT-A-CATH LEFT CHEST;  Surgeon: Elam Dutch, MD;  Location: [facility];  Service: Vascular;  Laterality: Left;   RETINAL DETACHMENT SURGERY Right    SCLEROTHERAPY  07/23/2020   Procedure: SCLEROTHERAPY;  Surgeon: Rogene Houston, MD;  [facility];  Service: Endoscopy;;   UPPER EXTREMITY VENOGRAPHY N/A 08/22/2020   Procedure: LEFT UPPER & CENTRAL VENOGRAPHY;  Surgeon: Marty Heck, MD;  Location: [facility];  Service: Cardiovascular;  Laterality: N/A;    Social History   Socioeconomic History   Marital status: Single    Spouse name: Not on file   Number of children: Not on file   Years of education: Not on file   Highest education level: Not on file  Occupational History   Occupation: retired   Tobacco Use   Smoking status: Never   Smokeless tobacco: Never  Vaping Use   Vaping Use: Never used  Substance and Sexual Activity   Alcohol use: No    Alcohol/week: 0.0 standard drinks of alcohol   Drug use: No   Sexual activity: Never  Other Topics Concern   Not on file  Social History  Narrative   Long term resident of SNF    Social Determinants of Health   Financial Resource Strain: [place]  (01/09/2020)   Overall Financial Resource Strain (CARDIA)    Difficulty of Paying Living Expenses: Not very hard  Food Insecurity: No Food Insecurity (01/09/2020)   Hunger Vital Sign    Worried About Running Out of Food in the Last Year: Never true    Ran Out of Food in the Last Year: Never true  Transportation Needs: No Transportation Needs (01/09/2020)   PRAPARE - Hydrologist (Medical): No    Lack of Transportation (Non-Medical): No  Physical Activity: Inactive (01/09/2020)   Exercise Vital Sign    Days of Exercise per Week: 0 days    Minutes of Exercise per Session: 0 min  Stress: No Stress Concern Present (01/09/2020)   [place]    Feeling of Stress : Not at all  Social Connections: Moderately Isolated (01/09/2020)   Social Connection and Isolation Panel [NHANES]  Frequency of Communication with Friends and Family: More than three times a week    Frequency of Social Gatherings with Friends and Family: Once a week    Attends Religious Services: More than 4 times per year    Active Member of [employer] or Organizations: No    Attends Archivist Meetings: Never    Marital Status: Never married  Intimate Partner Violence: Not At Risk (01/09/2020)   Humiliation, Afraid, Rape, and Kick questionnaire    Fear of Current or Ex-Partner: No    Emotionally Abused: No    Physically Abused: No    Sexually Abused: No   Family History  Problem Relation Age of Onset   Multiple myeloma Sister    Brain cancer Sister    Dementia Mother        died at 87   Stroke Mother    Heart failure Mother    Diabetes Mother    Heart disease Father    Prostate cancer Brother    Colon cancer Neg Hx       VITAL SIGNS BP 130/78   Pulse 98   Temp 97.8 F (36.6 C)   Resp 20   Ht '5\' 3"'$   (1.6 m)   Wt 138 lb 4 oz (62.7 kg)   SpO2 97%   BMI 24.49 kg/m   Outpatient Encounter Medications as of 04/07/2022  Medication Sig   acetaminophen (TYLENOL) 325 MG tablet Take 650 mg by mouth every 8 (eight) hours.   acyclovir (ZOVIRAX) 200 MG capsule Take 200 mg by mouth in the morning. (0800)   Amino Acids-Protein Hydrolys (FEEDING SUPPLEMENT, PRO-STAT SUGAR FREE 64,) LIQD Take 30 mLs by mouth 3 (three) times daily with meals. (0800, 1200 & 1800)   anastrozole (ARIMIDEX) 1 MG tablet TAKE 1 TABLET BY MOUTH DAILY   calcium-vitamin D (OSCAL WITH D) 500-200 MG-UNIT tablet Take 1 tablet by mouth in the morning. (0800)   denosumab (PROLIA) 60 MG/ML SOSY injection Inject 60 mg into the skin every 6 (six) months.   docusate sodium (COLACE) 100 MG capsule Take 100 mg by mouth 2 (two) times daily.   Insulin Pen Needle (BD AUTOSHIELD DUO) 30G X 5 MM MISC by Does not apply route. 3/16"   melatonin 3 MG TABS tablet Take 6 mg by mouth at bedtime. (2000) For Sleep   multivitamin (RENA-VIT) TABS tablet Take 1 tablet by mouth at bedtime. (2100)   Nutritional Supplements (ENSURE PLUS PO) Take 1 Can by mouth daily. 8pm   ondansetron (ZOFRAN) 4 MG tablet Take 1 tablet (4 mg total) by mouth every 6 (six) hours as needed for nausea.   ondansetron (ZOFRAN-ODT) 4 MG disintegrating tablet Take by mouth daily.   pantoprazole (PROTONIX) 40 MG tablet Take 40 mg by mouth 2 (two) times daily.   sertraline (ZOLOFT) 50 MG tablet Take 50 mg by mouth daily.   sucralfate (CARAFATE) 1 GM/10ML suspension Take 1 g by mouth in the morning, at noon, in the evening, and at bedtime. (0800, 1200, 1700 & 2100)   UNABLE TO FIND Dysphagia 3 Diet with Thin liquids   [DISCONTINUED] sertraline (ZOLOFT) 25 MG tablet Take 25 mg by mouth daily. At 9 am along with 50 mg tablet for a total of 75 mg daily   Facility-Administered Encounter Medications as of 04/07/2022  Medication   lanreotide acetate (SOMATULINE DEPOT) 120 MG/0.5ML injection    lanreotide acetate (SOMATULINE DEPOT) 120 MG/0.5ML injection   lanreotide acetate (SOMATULINE DEPOT) 120 MG/0.5ML injection  lanreotide acetate (SOMATULINE DEPOT) 120 MG/0.5ML injection   octreotide (SANDOSTATIN LAR) 30 MG IM injection   octreotide (SANDOSTATIN LAR) 30 MG IM injection     SIGNIFICANT DIAGNOSTIC EXAMS       ASSESSMENT/ PLAN:     Ok Edwards NP [facility]  Contact [phone] Monday through Friday 8am- 5pm  After hours call [phone]

## 2022-04-08 DIAGNOSIS — N186 End stage renal disease: Secondary | ICD-10-CM | POA: Diagnosis not present

## 2022-04-08 DIAGNOSIS — Z992 Dependence on renal dialysis: Secondary | ICD-10-CM | POA: Diagnosis not present

## 2022-04-08 DIAGNOSIS — N250 Renal osteodystrophy: Secondary | ICD-10-CM | POA: Diagnosis not present

## 2022-04-08 DIAGNOSIS — N2581 Secondary hyperparathyroidism of renal origin: Secondary | ICD-10-CM | POA: Diagnosis not present

## 2022-04-10 DIAGNOSIS — N186 End stage renal disease: Secondary | ICD-10-CM | POA: Diagnosis not present

## 2022-04-10 DIAGNOSIS — Z992 Dependence on renal dialysis: Secondary | ICD-10-CM | POA: Diagnosis not present

## 2022-04-10 DIAGNOSIS — N2581 Secondary hyperparathyroidism of renal origin: Secondary | ICD-10-CM | POA: Diagnosis not present

## 2022-04-13 ENCOUNTER — Other Ambulatory Visit: Payer: Self-pay

## 2022-04-13 DIAGNOSIS — N186 End stage renal disease: Secondary | ICD-10-CM | POA: Diagnosis not present

## 2022-04-13 DIAGNOSIS — N2581 Secondary hyperparathyroidism of renal origin: Secondary | ICD-10-CM | POA: Diagnosis not present

## 2022-04-13 DIAGNOSIS — Z992 Dependence on renal dialysis: Secondary | ICD-10-CM | POA: Diagnosis not present

## 2022-04-15 DIAGNOSIS — N186 End stage renal disease: Secondary | ICD-10-CM | POA: Diagnosis not present

## 2022-04-15 DIAGNOSIS — N2581 Secondary hyperparathyroidism of renal origin: Secondary | ICD-10-CM | POA: Diagnosis not present

## 2022-04-15 DIAGNOSIS — Z992 Dependence on renal dialysis: Secondary | ICD-10-CM | POA: Diagnosis not present

## 2022-04-16 ENCOUNTER — Inpatient Hospital Stay: Payer: Medicare PPO

## 2022-04-16 ENCOUNTER — Inpatient Hospital Stay: Payer: Medicare PPO | Attending: Hematology

## 2022-04-16 VITALS — BP 143/75 | HR 89 | Temp 99.2°F | Resp 18 | Wt 133.4 lb

## 2022-04-16 DIAGNOSIS — C9000 Multiple myeloma not having achieved remission: Secondary | ICD-10-CM

## 2022-04-16 DIAGNOSIS — Z5112 Encounter for antineoplastic immunotherapy: Secondary | ICD-10-CM | POA: Diagnosis not present

## 2022-04-16 LAB — COMPREHENSIVE METABOLIC PANEL
ALT: 10 U/L (ref 0–44)
AST: 19 U/L (ref 15–41)
Albumin: 3.7 g/dL (ref 3.5–5.0)
Alkaline Phosphatase: 55 U/L (ref 38–126)
Anion gap: 13 (ref 5–15)
BUN: 24 mg/dL — ABNORMAL HIGH (ref 8–23)
CO2: 26 mmol/L (ref 22–32)
Calcium: 8 mg/dL — ABNORMAL LOW (ref 8.9–10.3)
Chloride: 98 mmol/L (ref 98–111)
Creatinine, Ser: 5.75 mg/dL — ABNORMAL HIGH (ref 0.44–1.00)
GFR, Estimated: 7 mL/min — ABNORMAL LOW (ref >60)
Glucose, Bld: 148 mg/dL — ABNORMAL HIGH (ref 70–99)
Potassium: 3.9 mmol/L (ref 3.5–5.1)
Sodium: 137 mmol/L (ref 135–145)
Total Bilirubin: 0.5 mg/dL (ref 0.3–1.2)
Total Protein: 6.6 g/dL (ref 6.5–8.1)

## 2022-04-16 LAB — CBC WITH DIFFERENTIAL/PLATELET
Abs Immature Granulocytes: 0.01 10*3/uL (ref 0.00–0.07)
Basophils Absolute: 0 10*3/uL (ref 0.0–0.1)
Basophils Relative: 1 %
Eosinophils Absolute: 0.2 10*3/uL (ref 0.0–0.5)
Eosinophils Relative: 5 %
HCT: 33.2 % — ABNORMAL LOW (ref 36.0–46.0)
Hemoglobin: 10.4 g/dL — ABNORMAL LOW (ref 12.0–15.0)
Immature Granulocytes: 0 %
Lymphocytes Relative: 24 %
Lymphs Abs: 0.9 10*3/uL (ref 0.7–4.0)
MCH: 30.8 pg (ref 26.0–34.0)
MCHC: 31.3 g/dL (ref 30.0–36.0)
MCV: 98.2 fL (ref 80.0–100.0)
Monocytes Absolute: 0.4 10*3/uL (ref 0.1–1.0)
Monocytes Relative: 10 %
Neutro Abs: 2.3 10*3/uL (ref 1.7–7.7)
Neutrophils Relative %: 60 %
Platelets: 237 10*3/uL (ref 150–400)
RBC: 3.38 MIL/uL — ABNORMAL LOW (ref 3.87–5.11)
RDW: 17.2 % — ABNORMAL HIGH (ref 11.5–15.5)
WBC: 3.8 10*3/uL — ABNORMAL LOW (ref 4.0–10.5)
nRBC: 0 % (ref 0.0–0.2)

## 2022-04-16 LAB — MAGNESIUM: Magnesium: 2.3 mg/dL (ref 1.7–2.4)

## 2022-04-16 MED ORDER — BORTEZOMIB CHEMO SQ INJECTION 3.5 MG (2.5MG/ML)
1.2000 mg/m2 | Freq: Once | INTRAMUSCULAR | Status: AC
  Administered 2022-04-16: 2 mg via SUBCUTANEOUS
  Filled 2022-04-16: qty 0.8

## 2022-04-16 MED ORDER — PROCHLORPERAZINE MALEATE 10 MG PO TABS
10.0000 mg | ORAL_TABLET | Freq: Once | ORAL | Status: AC
  Administered 2022-04-16: 10 mg via ORAL
  Filled 2022-04-16: qty 1

## 2022-04-16 NOTE — Patient Instructions (Signed)
[facility]  Discharge Instructions: Thank you for choosing [facility] to provide your oncology and hematology care.  If you have a lab appointment with the [facility], please come in thru the Main Entrance and check in at the main information desk.  Wear comfortable clothing and clothing appropriate for easy access to any Portacath or PICC line.   We strive to give you quality time with your provider. You may need to reschedule your appointment if you arrive late (15 or more minutes).  Arriving late affects you and other patients whose appointments are after yours.  Also, if you miss three or more appointments without notifying the office, you may be dismissed from the clinic at the provider's discretion.      For prescription refill requests, have your pharmacy contact our office and allow 72 hours for refills to be completed.    Today you received the following chemotherapy and/or immunotherapy agents Velcade.  Bortezomib Injection What is this medication? BORTEZOMIB (bor TEZ oh mib) treats lymphoma. It may also be used to treat multiple myeloma, a type of bone marrow cancer. It works by blocking a protein that causes cancer cells to grow and multiply. This helps to slow or stop the spread of cancer cells. This medicine may be used for other purposes; ask your health care provider or pharmacist if you have questions. COMMON BRAND NAME(S): Velcade What should I tell my care team before I take this medication? They need to know if you have any of these conditions: Dehydration Diabetes Heart disease Liver disease Tingling of the fingers or toes or other nerve disorder An unusual or allergic reaction to bortezomib, other medications, foods, dyes, or preservatives If you or your partner are pregnant or trying to get pregnant Breastfeeding How should I use this medication? This medication is injected into a vein or under the skin. It is given by your  care team in a hospital or clinic setting. Talk to your care team about the use of this medication in children. Special care may be needed. Overdosage: If you think you have taken too much of this medicine contact a poison control center or emergency room at once. NOTE: This medicine is only for you. Do not share this medicine with others. What if I miss a dose? Keep appointments for follow-up doses. It is important not to miss your dose. Call your care team if you are unable to keep an appointment. What may interact with this medication? Ketoconazole Rifampin This list may not describe all possible interactions. Give your health care provider a list of all the medicines, herbs, non-prescription drugs, or dietary supplements you use. Also tell them if you smoke, drink alcohol, or use illegal drugs. Some items may interact with your medicine. What should I watch for while using this medication? Your condition will be monitored carefully while you are receiving this medication. You may need blood work while taking this medication. This medication may affect your coordination, reaction time, or judgment. Do not drive or operate machinery until you know how this medication affects you. Sit up or stand slowly to reduce the risk of dizzy or fainting spells. Drinking alcohol with this medication can increase the risk of these side effects. This medication may increase your risk of getting an infection. Call your care team for advice if you get a fever, chills, sore throat, or other symptoms of a cold or flu. Do not treat yourself. Try to avoid being around  people who are sick. Check with your care team if you have severe diarrhea, nausea, and vomiting, or if you sweat a lot. The loss of too much body fluid may make it dangerous for you to take this medication. Talk to your care team if you may be pregnant. Serious birth defects can occur if you take this medication during pregnancy and for 7 months after the  last dose. You will need a negative pregnancy test before starting this medication. Contraception is recommended while taking this medication and for 7 months after the last dose. Your care team can help you find the option that works for you. If your partner can get pregnant, use a condom during sex while taking this medication and for 4 months after the last dose. Do not breastfeed while taking this medication and for 2 months after the last dose. This medication may cause infertility. Talk to your care team if you are concerned about your fertility. What side effects may I notice from receiving this medication? Side effects that you should report to your care team as soon as possible: Allergic reactions--skin rash, itching, hives, swelling of the face, lips, tongue, or throat Bleeding--bloody or black, tar-like stools, vomiting blood or Diora Bellizzi material that looks like coffee grounds, red or dark Azuree Minish urine, small red or purple spots on skin, unusual bruising or bleeding Bleeding in the brain--severe headache, stiff neck, confusion, dizziness, change in vision, numbness or weakness of the face, arm, or leg, trouble speaking, trouble walking, vomiting Bowel blockage--stomach cramping, unable to have a bowel movement or pass gas, loss of appetite, vomiting Heart failure--shortness of breath, swelling of the ankles, feet, or hands, sudden weight gain, unusual weakness or fatigue Infection--fever, chills, cough, sore throat, wounds that don't heal, pain or trouble when passing urine, general feeling of discomfort or being unwell Liver injury--right upper belly pain, loss of appetite, nausea, light-colored stool, dark yellow or Hindy Perrault urine, yellowing skin or eyes, unusual weakness or fatigue Low blood pressure--dizziness, feeling faint or lightheaded, blurry vision Lung injury--shortness of breath or trouble breathing, cough, spitting up blood, chest pain, fever Pain, tingling, or numbness in the hands  or feet Severe or prolonged diarrhea Stomach pain, bloody diarrhea, pale skin, unusual weakness or fatigue, decrease in the amount of urine, which may be signs of hemolytic uremic syndrome Sudden and severe headache, confusion, change in vision, seizures, which may be signs of posterior reversible encephalopathy syndrome (PRES) TTP--purple spots on the skin or inside the mouth, pale skin, yellowing skin or eyes, unusual weakness or fatigue, fever, fast or irregular heartbeat, confusion, change in vision, trouble speaking, trouble walking Tumor lysis syndrome (TLS)--nausea, vomiting, diarrhea, decrease in the amount of urine, dark urine, unusual weakness or fatigue, confusion, muscle pain or cramps, fast or irregular heartbeat, joint pain Side effects that usually do not require medical attention (report to your care team if they continue or are bothersome): Constipation Diarrhea Fatigue Loss of appetite Nausea This list may not describe all possible side effects. Call your doctor for medical advice about side effects. You may report side effects to FDA at 1-800-FDA-1088. Where should I keep my medication? This medication is given in a hospital or clinic. It will not be stored at home. NOTE: This sheet is a summary. It may not cover all possible information. If you have questions about this medicine, talk to your doctor, pharmacist, or health care provider.  2023 Elsevier/Gold Standard (2021-07-23 00:00:00)        To help  prevent nausea and vomiting after your treatment, we encourage you to take your nausea medication as directed.  BELOW ARE SYMPTOMS THAT SHOULD BE REPORTED IMMEDIATELY: *FEVER GREATER THAN 100.4 F (38 C) OR HIGHER *CHILLS OR SWEATING *NAUSEA AND VOMITING THAT IS NOT CONTROLLED WITH YOUR NAUSEA MEDICATION *UNUSUAL SHORTNESS OF BREATH *UNUSUAL BRUISING OR BLEEDING *URINARY PROBLEMS (pain or burning when urinating, or frequent urination) *BOWEL PROBLEMS (unusual diarrhea,  constipation, pain near the anus) TENDERNESS IN MOUTH AND THROAT WITH OR WITHOUT PRESENCE OF ULCERS (sore throat, sores in mouth, or a toothache) UNUSUAL RASH, SWELLING OR PAIN  UNUSUAL VAGINAL DISCHARGE OR ITCHING   Items with * indicate a potential emergency and should be followed up as soon as possible or go to the Emergency Department if any problems should occur.  Please show the CHEMOTHERAPY ALERT CARD or IMMUNOTHERAPY ALERT CARD at check-in to the Emergency Department and triage nurse.  Should you have questions after your visit or need to cancel or reschedule your appointment, please contact [facility] [phone]  and follow the prompts.  Office hours are 8:00 a.m. to 4:30 p.m. Monday - Friday. Please note that voicemails left after 4:00 p.m. may not be returned until the following business day.  We are closed weekends and major holidays. You have access to a nurse at all times for urgent questions. Please call the main number to the clinic [phone] and follow the prompts.  For any non-urgent questions, you may also contact your provider using MyChart. We now offer e-Visits for anyone 19 and older to request care online for non-urgent symptoms. For details visit [email or web address].   Also download the MyChart app! Go to the app store, search "MyChart", open the app, select [facility], and log in with your MyChart username and password.

## 2022-04-16 NOTE — Progress Notes (Signed)
Patient presents today for Velcade injection.  Patient is in satisfactory condition with no new complaints voiced.  Vital signs are stable.  Labs reviewed and all labs are within treatment parameters.  We will proceed with injection per MD orders.   Patient tolerated injection well with no complaints voiced.  Patient left via wheelchair with nurse aide in stable condition.  Vital signs stable at discharge.  Follow up as scheduled.

## 2022-04-17 DIAGNOSIS — N186 End stage renal disease: Secondary | ICD-10-CM | POA: Diagnosis not present

## 2022-04-17 DIAGNOSIS — Z992 Dependence on renal dialysis: Secondary | ICD-10-CM | POA: Diagnosis not present

## 2022-04-17 DIAGNOSIS — N2581 Secondary hyperparathyroidism of renal origin: Secondary | ICD-10-CM | POA: Diagnosis not present

## 2022-04-20 DIAGNOSIS — Z992 Dependence on renal dialysis: Secondary | ICD-10-CM | POA: Diagnosis not present

## 2022-04-20 DIAGNOSIS — N2581 Secondary hyperparathyroidism of renal origin: Secondary | ICD-10-CM | POA: Diagnosis not present

## 2022-04-20 DIAGNOSIS — N186 End stage renal disease: Secondary | ICD-10-CM | POA: Diagnosis not present

## 2022-04-22 DIAGNOSIS — N186 End stage renal disease: Secondary | ICD-10-CM | POA: Diagnosis not present

## 2022-04-22 DIAGNOSIS — N2581 Secondary hyperparathyroidism of renal origin: Secondary | ICD-10-CM | POA: Diagnosis not present

## 2022-04-22 DIAGNOSIS — Z992 Dependence on renal dialysis: Secondary | ICD-10-CM | POA: Diagnosis not present

## 2022-04-24 DIAGNOSIS — Z992 Dependence on renal dialysis: Secondary | ICD-10-CM | POA: Diagnosis not present

## 2022-04-24 DIAGNOSIS — N2581 Secondary hyperparathyroidism of renal origin: Secondary | ICD-10-CM | POA: Diagnosis not present

## 2022-04-24 DIAGNOSIS — N186 End stage renal disease: Secondary | ICD-10-CM | POA: Diagnosis not present

## 2022-04-27 DIAGNOSIS — Z992 Dependence on renal dialysis: Secondary | ICD-10-CM | POA: Diagnosis not present

## 2022-04-27 DIAGNOSIS — N2581 Secondary hyperparathyroidism of renal origin: Secondary | ICD-10-CM | POA: Diagnosis not present

## 2022-04-27 DIAGNOSIS — N186 End stage renal disease: Secondary | ICD-10-CM | POA: Diagnosis not present

## 2022-04-28 ENCOUNTER — Non-Acute Institutional Stay (SKILLED_NURSING_FACILITY): Payer: Medicare PPO | Admitting: Internal Medicine

## 2022-04-28 ENCOUNTER — Encounter: Payer: Self-pay | Admitting: Internal Medicine

## 2022-04-28 DIAGNOSIS — N186 End stage renal disease: Secondary | ICD-10-CM

## 2022-04-28 DIAGNOSIS — D509 Iron deficiency anemia, unspecified: Secondary | ICD-10-CM

## 2022-04-28 DIAGNOSIS — E1122 Type 2 diabetes mellitus with diabetic chronic kidney disease: Secondary | ICD-10-CM

## 2022-04-28 DIAGNOSIS — I120 Hypertensive chronic kidney disease with stage 5 chronic kidney disease or end stage renal disease: Secondary | ICD-10-CM

## 2022-04-28 DIAGNOSIS — R200 Anesthesia of skin: Secondary | ICD-10-CM | POA: Diagnosis not present

## 2022-04-28 DIAGNOSIS — E43 Unspecified severe protein-calorie malnutrition: Secondary | ICD-10-CM

## 2022-04-28 DIAGNOSIS — Z992 Dependence on renal dialysis: Secondary | ICD-10-CM | POA: Diagnosis not present

## 2022-04-28 NOTE — Assessment & Plan Note (Signed)
Anemia remains stable w/o report of bleeding dyscrasias. Oncology monitoring CBC.

## 2022-04-28 NOTE — Progress Notes (Signed)
NURSING HOME LOCATION:  [facility] ROOM NUMBER:  [id number]  CODE STATUS:  DNR  PCP:  Ok Edwards NP  This is a nursing facility follow up visit of chronic medical diagnoses & to document compliance with Regulation 483.30 (c) in [employer] [place] Manual Phase 2 which mandates caregiver visit ( visits can alternate among physician, PA or NP as per statutes) within 10 days of 30 days / 60 days/ 90 days post admission to SNF date    Interim medical record and care since last SNF visit was updated with review of diagnostic studies and change in clinical status since last visit were documented.  HPI: She is a permanent resident of this facility with medical diagnoses of chronic macrocytic anemia, end-stage renal disease on hemodialysis 3 days weekly, neurocognitive deficit, anxiety/depression, GERD, dyslipidemia, essential hypertension, multiple myeloma, breast cancer and diabetes with renal complications. She has had a partial mastectomy but remains on Arimidex.  Labs are performed at hemodialysis 3 times a week.  The most recent labs in [place] were 04/16/2022 and revealed a creatinine of 5.75 with a GFR of 7.  Albumin was 3.7 and total protein 6.6.  Her chronic anemia is essentially stable with H/H of 10.4/33.2.  Labs related to her multiple myeloma reveal kappa free light chain of 162.9 with normals of 3.3-19.4 and lambda free light chains of 482.6 with normals of 5.7-26.3.  Oncologist Dr. Delton Coombes follows her and her last infusion was 2/8. The most recent A1c was 5.3% on 10/10/2021.  Glucoses recorded in Epic ranged from a low of 86 up to a high of 222.  Review of systems: Dementia invalidated responses.  She initially stated that she was "doing fine."  She does describe soreness in the right shin area which is intermittent but much improved.  She states this was related to a fall "a long time ago", possibly 2 years ago.  She describes numbness in her fingers at night. She  states she lost a lens from her glasses and cannot read the small print in her Bible. She then stated that her oldest sister died here in 09-07-2022 and that "affected me and made me sad."  Despite this she denies anxiety and depression.  Constitutional: No fever, significant weight change, fatigue  Eyes: No redness, discharge, pain ENT/mouth: No nasal congestion,  purulent discharge, earache, change in hearing, sore throat  Cardiovascular: No chest pain, palpitations, paroxysmal nocturnal dyspnea, claudication, edema  Respiratory: No cough, sputum production, hemoptysis, DOE, significant snoring, apnea   Gastrointestinal: No heartburn, dysphagia, abdominal pain, nausea /vomiting, rectal bleeding, melena, change in bowels Genitourinary: No dysuria, hematuria, pyuria, incontinence, nocturia Dermatologic: No rash, pruritus, change in appearance of skin Neurologic: No dizziness, headache, syncope, seizures Psychiatric: No significant  insomnia, anorexia Endocrine: No change in hair/skin/nails, excessive thirst, excessive hunger, excessive urination  Hematologic/lymphatic: No significant bruising, lymphadenopathy, abnormal bleeding Allergy/immunology: No itchy/watery eyes, significant sneezing, urticaria, angioedema  Physical exam:  Pertinent or positive findings: Affect is flat.  She has bilateral ptosis.  There is fine hirsutism over the chin.  The left nasolabial fold is decreased compared to the right.  First heart sound is increased.  Breath sounds are decreased.  There is a dressing over the left chest.  There is 1/2+ edema at the sock line.  There is 1+ edema over the feet despite compression hose.  Posterior tibial pulses are decreased more than dorsalis pedis pulses.  Deep tendon reflexes are 1/2+.  General  appearance: Adequately nourished; no acute distress, increased work of breathing is present.   Lymphatic: No lymphadenopathy about the head, neck, axilla. Eyes: No conjunctival inflammation  or lid edema is present. There is no scleral icterus. Ears:  External ear exam shows no significant lesions or deformities.   Nose:  External nasal examination shows no deformity or inflammation. Nasal mucosa are pink and moist without lesions, exudates Oral exam:  Lips and gums are healthy appearing. There is no oropharyngeal erythema or exudate. Neck:  No thyromegaly, masses, tenderness noted.    Heart:  Normal rate and regular rhythm. S2 normal without gallop, murmur, click, rub .  Lungs:  without wheezes, rhonchi, rales, rubs. Abdomen: Bowel sounds are normal. Abdomen is soft and nontender with no organomegaly, hernias, masses. GU: Deferred  Extremities:  No cyanosis, clubbing  Neurologic exam :Balance, Rhomberg, finger to nose testing could not be completed due to clinical state Skin: Warm & dry w/o tenting. No significant lesions or rash.  See summary under each active problem in the Problem List with associated updated therapeutic plan

## 2022-04-28 NOTE — Assessment & Plan Note (Signed)
Most recent A1c was 5.3% on 10/10/2021.  Epic glucose range is from a low of 86 up to a high of 222.  A1c will be updated.  Discordance in the context of ESRD is possible.

## 2022-04-28 NOTE — Assessment & Plan Note (Signed)
Total protein & albumin now WNL. Nutritionist continues to follow @ SNF.

## 2022-04-28 NOTE — Patient Instructions (Signed)
See assessment and plan under each diagnosis in the problem list and acutely for this visit 

## 2022-04-28 NOTE — Assessment & Plan Note (Addendum)
Blood pressure is controlled without antihypertensive medications.  Continue to monitor. If HTN recurs ACE-I or ARB not an option due to PMH of angioedema.

## 2022-04-29 DIAGNOSIS — N2581 Secondary hyperparathyroidism of renal origin: Secondary | ICD-10-CM | POA: Diagnosis not present

## 2022-04-29 DIAGNOSIS — Z992 Dependence on renal dialysis: Secondary | ICD-10-CM | POA: Diagnosis not present

## 2022-04-29 DIAGNOSIS — N186 End stage renal disease: Secondary | ICD-10-CM | POA: Diagnosis not present

## 2022-04-30 ENCOUNTER — Inpatient Hospital Stay: Payer: Medicare PPO

## 2022-04-30 ENCOUNTER — Other Ambulatory Visit: Payer: Self-pay

## 2022-04-30 VITALS — BP 135/78 | HR 84 | Temp 98.7°F | Resp 18 | Wt 142.2 lb

## 2022-04-30 DIAGNOSIS — C9000 Multiple myeloma not having achieved remission: Secondary | ICD-10-CM

## 2022-04-30 DIAGNOSIS — Z5112 Encounter for antineoplastic immunotherapy: Secondary | ICD-10-CM | POA: Diagnosis not present

## 2022-04-30 LAB — COMPREHENSIVE METABOLIC PANEL
ALT: 14 U/L (ref 0–44)
AST: 21 U/L (ref 15–41)
Albumin: 3.6 g/dL (ref 3.5–5.0)
Alkaline Phosphatase: 52 U/L (ref 38–126)
Anion gap: 14 (ref 5–15)
BUN: 28 mg/dL — ABNORMAL HIGH (ref 8–23)
CO2: 24 mmol/L (ref 22–32)
Calcium: 7.9 mg/dL — ABNORMAL LOW (ref 8.9–10.3)
Chloride: 93 mmol/L — ABNORMAL LOW (ref 98–111)
Creatinine, Ser: 5.35 mg/dL — ABNORMAL HIGH (ref 0.44–1.00)
GFR, Estimated: 7 mL/min — ABNORMAL LOW (ref >60)
Glucose, Bld: 95 mg/dL (ref 70–99)
Potassium: 4.3 mmol/L (ref 3.5–5.1)
Sodium: 131 mmol/L — ABNORMAL LOW (ref 135–145)
Total Bilirubin: 0.4 mg/dL (ref 0.3–1.2)
Total Protein: 6.9 g/dL (ref 6.5–8.1)

## 2022-04-30 LAB — CBC WITH DIFFERENTIAL/PLATELET
Abs Immature Granulocytes: 0.01 10*3/uL (ref 0.00–0.07)
Basophils Absolute: 0 10*3/uL (ref 0.0–0.1)
Basophils Relative: 1 %
Eosinophils Absolute: 0.2 10*3/uL (ref 0.0–0.5)
Eosinophils Relative: 5 %
HCT: 33.2 % — ABNORMAL LOW (ref 36.0–46.0)
Hemoglobin: 10.5 g/dL — ABNORMAL LOW (ref 12.0–15.0)
Immature Granulocytes: 0 %
Lymphocytes Relative: 24 %
Lymphs Abs: 1 10*3/uL (ref 0.7–4.0)
MCH: 30.4 pg (ref 26.0–34.0)
MCHC: 31.6 g/dL (ref 30.0–36.0)
MCV: 96.2 fL (ref 80.0–100.0)
Monocytes Absolute: 0.4 10*3/uL (ref 0.1–1.0)
Monocytes Relative: 11 %
Neutro Abs: 2.3 10*3/uL (ref 1.7–7.7)
Neutrophils Relative %: 59 %
Platelets: 243 10*3/uL (ref 150–400)
RBC: 3.45 MIL/uL — ABNORMAL LOW (ref 3.87–5.11)
RDW: 16.6 % — ABNORMAL HIGH (ref 11.5–15.5)
WBC: 3.9 10*3/uL — ABNORMAL LOW (ref 4.0–10.5)
nRBC: 0 % (ref 0.0–0.2)

## 2022-04-30 LAB — MAGNESIUM: Magnesium: 2.1 mg/dL (ref 1.7–2.4)

## 2022-04-30 MED ORDER — PROCHLORPERAZINE MALEATE 10 MG PO TABS
10.0000 mg | ORAL_TABLET | Freq: Once | ORAL | Status: AC
  Administered 2022-04-30: 10 mg via ORAL
  Filled 2022-04-30: qty 1

## 2022-04-30 MED ORDER — BORTEZOMIB CHEMO SQ INJECTION 3.5 MG (2.5MG/ML)
1.2000 mg/m2 | Freq: Once | INTRAMUSCULAR | Status: AC
  Administered 2022-04-30: 2 mg via SUBCUTANEOUS
  Filled 2022-04-30: qty 0.8

## 2022-04-30 NOTE — Progress Notes (Signed)
Pt presents today for Velcade per provider's order. Vital signs and labs WNL for treatment. Okay to proceed with treatment today.   Velcade given today per MD orders. Tolerated infusion without adverse affects. Vital signs stable. No complaints at this time. Discharged from clinic via wheelchair in stable condition. Alert and oriented x 3. F/U with [facility] as scheduled.

## 2022-05-01 DIAGNOSIS — N2581 Secondary hyperparathyroidism of renal origin: Secondary | ICD-10-CM | POA: Diagnosis not present

## 2022-05-01 DIAGNOSIS — Z992 Dependence on renal dialysis: Secondary | ICD-10-CM | POA: Diagnosis not present

## 2022-05-01 DIAGNOSIS — N186 End stage renal disease: Secondary | ICD-10-CM | POA: Diagnosis not present

## 2022-05-04 DIAGNOSIS — N2581 Secondary hyperparathyroidism of renal origin: Secondary | ICD-10-CM | POA: Diagnosis not present

## 2022-05-04 DIAGNOSIS — N186 End stage renal disease: Secondary | ICD-10-CM | POA: Diagnosis not present

## 2022-05-04 DIAGNOSIS — Z992 Dependence on renal dialysis: Secondary | ICD-10-CM | POA: Diagnosis not present

## 2022-05-06 DIAGNOSIS — Z992 Dependence on renal dialysis: Secondary | ICD-10-CM | POA: Diagnosis not present

## 2022-05-06 DIAGNOSIS — N2581 Secondary hyperparathyroidism of renal origin: Secondary | ICD-10-CM | POA: Diagnosis not present

## 2022-05-06 DIAGNOSIS — N186 End stage renal disease: Secondary | ICD-10-CM | POA: Diagnosis not present

## 2022-05-07 DIAGNOSIS — N186 End stage renal disease: Secondary | ICD-10-CM | POA: Diagnosis not present

## 2022-05-07 DIAGNOSIS — Z992 Dependence on renal dialysis: Secondary | ICD-10-CM | POA: Diagnosis not present

## 2022-05-08 DIAGNOSIS — N186 End stage renal disease: Secondary | ICD-10-CM | POA: Diagnosis not present

## 2022-05-08 DIAGNOSIS — Z992 Dependence on renal dialysis: Secondary | ICD-10-CM | POA: Diagnosis not present

## 2022-05-08 DIAGNOSIS — N2581 Secondary hyperparathyroidism of renal origin: Secondary | ICD-10-CM | POA: Diagnosis not present

## 2022-05-11 DIAGNOSIS — N186 End stage renal disease: Secondary | ICD-10-CM | POA: Diagnosis not present

## 2022-05-11 DIAGNOSIS — N2581 Secondary hyperparathyroidism of renal origin: Secondary | ICD-10-CM | POA: Diagnosis not present

## 2022-05-11 DIAGNOSIS — Z992 Dependence on renal dialysis: Secondary | ICD-10-CM | POA: Diagnosis not present

## 2022-05-13 DIAGNOSIS — N2581 Secondary hyperparathyroidism of renal origin: Secondary | ICD-10-CM | POA: Diagnosis not present

## 2022-05-13 DIAGNOSIS — Z992 Dependence on renal dialysis: Secondary | ICD-10-CM | POA: Diagnosis not present

## 2022-05-13 DIAGNOSIS — N186 End stage renal disease: Secondary | ICD-10-CM | POA: Diagnosis not present

## 2022-05-15 DIAGNOSIS — Z992 Dependence on renal dialysis: Secondary | ICD-10-CM | POA: Diagnosis not present

## 2022-05-15 DIAGNOSIS — N2581 Secondary hyperparathyroidism of renal origin: Secondary | ICD-10-CM | POA: Diagnosis not present

## 2022-05-15 DIAGNOSIS — N186 End stage renal disease: Secondary | ICD-10-CM | POA: Diagnosis not present

## 2022-05-18 DIAGNOSIS — N2581 Secondary hyperparathyroidism of renal origin: Secondary | ICD-10-CM | POA: Diagnosis not present

## 2022-05-18 DIAGNOSIS — Z992 Dependence on renal dialysis: Secondary | ICD-10-CM | POA: Diagnosis not present

## 2022-05-18 DIAGNOSIS — N186 End stage renal disease: Secondary | ICD-10-CM | POA: Diagnosis not present

## 2022-05-19 ENCOUNTER — Inpatient Hospital Stay: Payer: Medicare PPO | Attending: Hematology

## 2022-05-19 ENCOUNTER — Inpatient Hospital Stay: Payer: Medicare PPO

## 2022-05-19 VITALS — BP 137/80 | HR 96 | Temp 99.3°F | Resp 20 | Wt 134.5 lb

## 2022-05-19 DIAGNOSIS — C9000 Multiple myeloma not having achieved remission: Secondary | ICD-10-CM

## 2022-05-19 DIAGNOSIS — Z5112 Encounter for antineoplastic immunotherapy: Secondary | ICD-10-CM | POA: Diagnosis not present

## 2022-05-19 LAB — COMPREHENSIVE METABOLIC PANEL
ALT: 9 U/L (ref 0–44)
AST: 20 U/L (ref 15–41)
Albumin: 3.4 g/dL — ABNORMAL LOW (ref 3.5–5.0)
Alkaline Phosphatase: 51 U/L (ref 38–126)
Anion gap: 14 (ref 5–15)
BUN: 33 mg/dL — ABNORMAL HIGH (ref 8–23)
CO2: 24 mmol/L (ref 22–32)
Calcium: 8.1 mg/dL — ABNORMAL LOW (ref 8.9–10.3)
Chloride: 99 mmol/L (ref 98–111)
Creatinine, Ser: 6.03 mg/dL — ABNORMAL HIGH (ref 0.44–1.00)
GFR, Estimated: 6 mL/min — ABNORMAL LOW (ref >60)
Glucose, Bld: 170 mg/dL — ABNORMAL HIGH (ref 70–99)
Potassium: 3.9 mmol/L (ref 3.5–5.1)
Sodium: 137 mmol/L (ref 135–145)
Total Bilirubin: 0.3 mg/dL (ref 0.3–1.2)
Total Protein: 6.6 g/dL (ref 6.5–8.1)

## 2022-05-19 LAB — MAGNESIUM: Magnesium: 2.1 mg/dL (ref 1.7–2.4)

## 2022-05-19 LAB — CBC WITH DIFFERENTIAL/PLATELET
Abs Immature Granulocytes: 0.01 10*3/uL (ref 0.00–0.07)
Basophils Absolute: 0 10*3/uL (ref 0.0–0.1)
Basophils Relative: 1 %
Eosinophils Absolute: 0.2 10*3/uL (ref 0.0–0.5)
Eosinophils Relative: 5 %
HCT: 30.9 % — ABNORMAL LOW (ref 36.0–46.0)
Hemoglobin: 9.6 g/dL — ABNORMAL LOW (ref 12.0–15.0)
Immature Granulocytes: 0 %
Lymphocytes Relative: 18 %
Lymphs Abs: 0.7 10*3/uL (ref 0.7–4.0)
MCH: 30.7 pg (ref 26.0–34.0)
MCHC: 31.1 g/dL (ref 30.0–36.0)
MCV: 98.7 fL (ref 80.0–100.0)
Monocytes Absolute: 0.4 10*3/uL (ref 0.1–1.0)
Monocytes Relative: 9 %
Neutro Abs: 2.8 10*3/uL (ref 1.7–7.7)
Neutrophils Relative %: 67 %
Platelets: 236 10*3/uL (ref 150–400)
RBC: 3.13 MIL/uL — ABNORMAL LOW (ref 3.87–5.11)
RDW: 17 % — ABNORMAL HIGH (ref 11.5–15.5)
WBC: 4.1 10*3/uL (ref 4.0–10.5)
nRBC: 0 % (ref 0.0–0.2)

## 2022-05-19 MED ORDER — BORTEZOMIB CHEMO SQ INJECTION 3.5 MG (2.5MG/ML)
1.2000 mg/m2 | Freq: Once | INTRAMUSCULAR | Status: AC
  Administered 2022-05-19: 2 mg via SUBCUTANEOUS
  Filled 2022-05-19: qty 0.8

## 2022-05-19 MED ORDER — PROCHLORPERAZINE MALEATE 10 MG PO TABS
10.0000 mg | ORAL_TABLET | Freq: Once | ORAL | Status: AC
  Administered 2022-05-19: 10 mg via ORAL
  Filled 2022-05-19: qty 1

## 2022-05-19 NOTE — Progress Notes (Signed)
Patient presents today for Velcade injection per providers order.  Vital signs within parameters for treatment.  Labs pending.  No new complaints at this time.  Stable during administration without incident; injection site WNL; see MAR for injection details.  Patient tolerated procedure well and without incident.  No questions or complaints noted at this time.

## 2022-05-19 NOTE — Patient Instructions (Signed)
[facility]  Discharge Instructions: Thank you for choosing [facility] to provide your oncology and hematology care.  If you have a lab appointment with the Cancer Center, please come in thru the Main Entrance and check in at the main information desk.  Wear comfortable clothing and clothing appropriate for easy access to any Portacath or PICC line.   We strive to give you quality time with your provider. You may need to reschedule your appointment if you arrive late (15 or more minutes).  Arriving late affects you and other patients whose appointments are after yours.  Also, if you miss three or more appointments without notifying the office, you may be dismissed from the clinic at the provider's discretion.      For prescription refill requests, have your pharmacy contact our office and allow 72 hours for refills to be completed.    Today you received the following chemotherapy and/or immunotherapy agents Velcade      To help prevent nausea and vomiting after your treatment, we encourage you to take your nausea medication as directed.  BELOW ARE SYMPTOMS THAT SHOULD BE REPORTED IMMEDIATELY: *FEVER GREATER THAN 100.4 F (38 C) OR HIGHER *CHILLS OR SWEATING *NAUSEA AND VOMITING THAT IS NOT CONTROLLED WITH YOUR NAUSEA MEDICATION *UNUSUAL SHORTNESS OF BREATH *UNUSUAL BRUISING OR BLEEDING *URINARY PROBLEMS (pain or burning when urinating, or frequent urination) *BOWEL PROBLEMS (unusual diarrhea, constipation, pain near the anus) TENDERNESS IN MOUTH AND THROAT WITH OR WITHOUT PRESENCE OF ULCERS (sore throat, sores in mouth, or a toothache) UNUSUAL RASH, SWELLING OR PAIN  UNUSUAL VAGINAL DISCHARGE OR ITCHING   Items with * indicate a potential emergency and should be followed up as soon as possible or go to the Emergency Department if any problems should occur.  Please show the CHEMOTHERAPY ALERT CARD or IMMUNOTHERAPY ALERT CARD at check-in to the Emergency  Department and triage nurse.  Should you have questions after your visit or need to cancel or reschedule your appointment, please contact [facility] [phone]  and follow the prompts.  Office hours are 8:00 a.m. to 4:30 p.m. Monday - Friday. Please note that voicemails left after 4:00 p.m. may not be returned until the following business day.  We are closed weekends and major holidays. You have access to a nurse at all times for urgent questions. Please call the main number to the clinic [phone] and follow the prompts.  For any non-urgent questions, you may also contact your provider using MyChart. We now offer e-Visits for anyone 18 and older to request care online for non-urgent symptoms. For details visit [email or web address].   Also download the MyChart app! Go to the app store, search "MyChart", open the app, select [facility], and log in with your MyChart username and password.   

## 2022-05-20 DIAGNOSIS — N2581 Secondary hyperparathyroidism of renal origin: Secondary | ICD-10-CM | POA: Diagnosis not present

## 2022-05-20 DIAGNOSIS — N186 End stage renal disease: Secondary | ICD-10-CM | POA: Diagnosis not present

## 2022-05-20 DIAGNOSIS — Z992 Dependence on renal dialysis: Secondary | ICD-10-CM | POA: Diagnosis not present

## 2022-05-22 DIAGNOSIS — N186 End stage renal disease: Secondary | ICD-10-CM | POA: Diagnosis not present

## 2022-05-22 DIAGNOSIS — N2581 Secondary hyperparathyroidism of renal origin: Secondary | ICD-10-CM | POA: Diagnosis not present

## 2022-05-22 DIAGNOSIS — Z992 Dependence on renal dialysis: Secondary | ICD-10-CM | POA: Diagnosis not present

## 2022-05-25 DIAGNOSIS — N2581 Secondary hyperparathyroidism of renal origin: Secondary | ICD-10-CM | POA: Diagnosis not present

## 2022-05-25 DIAGNOSIS — Z992 Dependence on renal dialysis: Secondary | ICD-10-CM | POA: Diagnosis not present

## 2022-05-25 DIAGNOSIS — N186 End stage renal disease: Secondary | ICD-10-CM | POA: Diagnosis not present

## 2022-05-26 ENCOUNTER — Encounter: Payer: Self-pay | Admitting: Adult Health

## 2022-05-26 ENCOUNTER — Non-Acute Institutional Stay (SKILLED_NURSING_FACILITY): Payer: Medicare PPO | Admitting: Adult Health

## 2022-05-26 DIAGNOSIS — N186 End stage renal disease: Secondary | ICD-10-CM

## 2022-05-26 DIAGNOSIS — C50111 Malignant neoplasm of central portion of right female breast: Secondary | ICD-10-CM | POA: Diagnosis not present

## 2022-05-26 DIAGNOSIS — D631 Anemia in chronic kidney disease: Secondary | ICD-10-CM | POA: Diagnosis not present

## 2022-05-26 DIAGNOSIS — M810 Age-related osteoporosis without current pathological fracture: Secondary | ICD-10-CM

## 2022-05-26 DIAGNOSIS — Z170 Estrogen receptor positive status [ER+]: Secondary | ICD-10-CM

## 2022-05-26 DIAGNOSIS — C9000 Multiple myeloma not having achieved remission: Secondary | ICD-10-CM | POA: Diagnosis not present

## 2022-05-26 NOTE — Progress Notes (Signed)
Location:  [place] Room Number: 148-D Place of Service:  SNF (31) Provider: Ok Edwards, NP  CODE STATUS: DNR  Allergies  Allergen Reactions   Ace Inhibitors Other (See Comments) and Cough    Tongue swell , ie angioedema   Angiotensin Receptor Blockers     Angioedema with ACE-I   Penicillins Other (See Comments)    Unsure of reaction Has patient had a PCN reaction causing immediate rash, facial/tongue/throat swelling, SOB or lightheadedness with hypotension: Unknown Has patient had a PCN reaction causing severe rash involving mucus membranes or skin necrosis: Unknown Has patient had a PCN reaction that required hospitalization: No Has patient had a PCN reaction occurring within the last 10 years: Unknown If all of the above answers are "NO", then may proceed with Cephalosporin use.     Penicillin G     Chief Complaint  Patient presents with   Medical Management of Chronic Issues                  Anemia due to end stage renal disease: Post menopausal osteoporosis:  Stage 1 infiltrating ductal carcinoma of female right breast. Multiple myeloma not having achieved remission     HPI:  She is a 87 year old long term resident of this facility being seen for the management of her chronic illnesses:  Anemia due to end stage renal disease: Post menopausal osteoporosis:  Stage 1 infiltrating ductal carcinoma of female right breast. Multiple myeloma not having achieved remission. There are no reports of uncontrolled pain. Her weight is stable; no reports of anxiety   Past Medical History:  Diagnosis Date   Anemia     chronic macrocytic anemia   Anxiety    Chronic kidney disease    Chronic renal disease, stage 4, severely decreased glomerular filtration rate (GFR) between 15-29 mL/min/1.73 square meter (HCC) [id number]   Complication of anesthesia    delirious after Breast Surgery   Dementia ([place])    mild   Depression    Diabetes mellitus with ESRD  (end-stage renal disease) ([place])    type II   Dysphagia    Dyspnea    with activity   GERD (gastroesophageal reflux disease)    Glaucoma    Hyperlipidemia    Hypertension    Multiple myeloma (HCC)    Pneumonia    Stage 1 infiltrating ductal carcinoma of right female breast ([place]) 08/21/2015   ER+ PR+ HER 2 neu + (3+) T1cN0     Past Surgical History:  Procedure Laterality Date   A/V FISTULAGRAM Right 07/05/2020   Procedure: A/V Fistulagram;  Surgeon: Elam Dutch, MD;  Location: [place] [facility];  Service: Cardiovascular;  Laterality: Right;   AV FISTULA PLACEMENT Left 11/22/2017   Procedure: ARTERIOVENOUS (AV) FISTULA CREATION LEFT ARM;  Surgeon: Elam Dutch, MD;  Location: [facility] OR;  Service: Vascular;  Laterality: Left;   AV FISTULA PLACEMENT Right 04/04/2020   Procedure: RIGHT ARM ARTERIOVENOUS FISTULA CREATION;  Surgeon: Rosetta Posner, MD;  Location: [facility];  Service: Vascular;  Laterality: Right;   AV FISTULA PLACEMENT Right 08/20/2020   Procedure: ARTERIOVENOUS (AV) FISTULA LIGATION RIGHT ARM;  Surgeon: Elam Dutch, MD;  Location: [facility];  Service: Vascular;  Laterality: Right;   BIOPSY  08/07/2016   Procedure: BIOPSY;  Surgeon: Daneil Dolin, MD;  [facility];  Service: Endoscopy;;  gastric ulcer biopsy   COLONOSCOPY     ESOPHAGOGASTRODUODENOSCOPY N/A 08/07/2016  LA Grade A esophagitis s/p dilation, small hiatal hernia, multiple gastric ulcers and erosions, duodenal erosions s/p biopsy. Negative H.pylori    ESOPHAGOGASTRODUODENOSCOPY N/A 11/27/2016   normal esophagus, previously noted gastric ulcers completely healed, normal duodenum.    ESOPHAGOGASTRODUODENOSCOPY (EGD) WITH PROPOFOL N/A 07/23/2020   Procedure: ESOPHAGOGASTRODUODENOSCOPY (EGD) WITH PROPOFOL;  Surgeon: Rogene Houston, MD;  [facility];  Service: Endoscopy;  Laterality: N/A;   FISTULA SUPERFICIALIZATION Left 02/14/2018   Procedure: FISTULA SUPERFICIALIZATION LEFT ARM;   Surgeon: Angelia Mould, MD;  Location: [facility] OR;  Service: Vascular;  Laterality: Left;   FRACTURE SURGERY Right    ankle   HOT HEMOSTASIS  07/23/2020   Procedure: HOT HEMOSTASIS (ARGON PLASMA COAGULATION/BICAP);  Surgeon: Rogene Houston, MD;  [facility];  Service: Endoscopy;;   INTRAMEDULLARY (IM) NAIL INTERTROCHANTERIC Right 07/12/2020   Procedure: INTRAMEDULLARY (IM) NAIL INTERTROCHANTRIC;  Surgeon: Mordecai Rasmussen, MD;  Location: [facility];  Service: Orthopedics;  Laterality: Right;   MALONEY DILATION N/A 08/07/2016   Procedure: Venia Minks DILATION;  Surgeon: Daneil Dolin, MD;  [facility];  Service: Endoscopy;  Laterality: N/A;   MASTECTOMY, PARTIAL Right    PERIPHERAL VASCULAR BALLOON ANGIOPLASTY Left 07/13/2019   Procedure: PERIPHERAL VASCULAR BALLOON ANGIOPLASTY;  Surgeon: Marty Heck, MD;  Location: [place] [facility];  Service: Cardiovascular;  Laterality: Left;  arm fistulogram   PERIPHERAL VASCULAR BALLOON ANGIOPLASTY Right 05/22/2020   Procedure: PERIPHERAL VASCULAR BALLOON ANGIOPLASTY;  Surgeon: Cherre Robins, MD;  Location: [place];  Service: Cardiovascular;  Laterality: Right;  arm fistula   PERIPHERAL VASCULAR BALLOON ANGIOPLASTY Right 07/05/2020   Procedure: PERIPHERAL VASCULAR BALLOON ANGIOPLASTY;  Surgeon: Elam Dutch, MD;  Location: [facility];  Service: Cardiovascular;  Laterality: Right;  arm fistula   PORT-A-CATH REMOVAL Left 11/22/2017   Procedure: REMOVAL PORT-A-CATH LEFT CHEST;  Surgeon: Elam Dutch, MD;  Location: [place];  Service: Vascular;  Laterality: Left;   RETINAL DETACHMENT SURGERY Right    SCLEROTHERAPY  07/23/2020   Procedure: SCLEROTHERAPY;  Surgeon: Rogene Houston, MD;  [facility];  Service: Endoscopy;;   UPPER EXTREMITY VENOGRAPHY N/A 08/22/2020   Procedure: LEFT UPPER & CENTRAL VENOGRAPHY;  Surgeon: Marty Heck, MD;  Location: [place] [facility];  Service:  Cardiovascular;  Laterality: N/A;    Social History   Socioeconomic History   Marital status: Single    Spouse name: Not on file   Number of children: Not on file   Years of education: Not on file   Highest education level: Not on file  Occupational History   Occupation: retired   Tobacco Use   Smoking status: Never   Smokeless tobacco: Never  Vaping Use   Vaping Use: Never used  Substance and Sexual Activity   Alcohol use: No    Alcohol/week: 0.0 standard drinks of alcohol   Drug use: No   Sexual activity: Never  Other Topics Concern   Not on file  Social History Narrative   Long term resident of SNF    Social Determinants of Health   Financial Resource Strain: [place]  (01/09/2020)   Overall Financial Resource Strain (CARDIA)    Difficulty of Paying Living Expenses: Not very hard  Food Insecurity: No Food Insecurity (01/09/2020)   Hunger Vital Sign    Worried About Running Out of Food in the Last Year: Never true    Ran Out of Food in the Last  Year: Never true  Transportation Needs: No Transportation Needs (01/09/2020)   PRAPARE - Hydrologist (Medical): No    Lack of Transportation (Non-Medical): No  Physical Activity: Inactive (01/09/2020)   Exercise Vital Sign    Days of Exercise per Week: 0 days    Minutes of Exercise per Session: 0 min  Stress: No Stress Concern Present (01/09/2020)   [place]    Feeling of Stress : Not at all  Social Connections: Moderately Isolated (01/09/2020)   Social Connection and Isolation Panel [NHANES]    Frequency of Communication with Friends and Family: More than three times a week    Frequency of Social Gatherings with Friends and Family: Once a week    Attends Religious Services: More than 4 times per year    Active Member of [employer] or Organizations: No    Attends Archivist Meetings: Never    Marital Status: Never married   Intimate Partner Violence: Not At Risk (01/09/2020)   Humiliation, Afraid, Rape, and Kick questionnaire    Fear of Current or Ex-Partner: No    Emotionally Abused: No    Physically Abused: No    Sexually Abused: No   Family History  Problem Relation Age of Onset   Multiple myeloma Sister    Brain cancer Sister    Dementia Mother        died at 19   Stroke Mother    Heart failure Mother    Diabetes Mother    Heart disease Father    Prostate cancer Brother    Colon cancer Neg Hx       VITAL SIGNS BP (!) 148/76   Pulse 78   Temp 97.6 F (36.4 C)   Resp 20   Ht 5\' 3"  (1.6 m)   Wt 139 lb (63 kg)   SpO2 95%   BMI 24.62 kg/m   Outpatient Encounter Medications as of 05/26/2022  Medication Sig   acetaminophen (TYLENOL) 325 MG tablet Take 650 mg by mouth every 8 (eight) hours.   acyclovir (ZOVIRAX) 200 MG capsule Take 200 mg by mouth in the morning. (0800)   Amino Acids-Protein Hydrolys (FEEDING SUPPLEMENT, PRO-STAT SUGAR FREE 64,) LIQD Take 30 mLs by mouth 3 (three) times daily with meals. (0800, 1200 & 1800)   anastrozole (ARIMIDEX) 1 MG tablet TAKE 1 TABLET BY MOUTH DAILY   calcium-vitamin D (OSCAL WITH D) 500-200 MG-UNIT tablet Take 1 tablet by mouth in the morning. (0800)   docusate sodium (COLACE) 100 MG capsule Take 100 mg by mouth 2 (two) times daily.   Insulin Pen Needle (BD AUTOSHIELD DUO) 30G X 5 MM MISC by Does not apply route. 3/16"   melatonin 3 MG TABS tablet Take 6 mg by mouth at bedtime. (2000) For Sleep   multivitamin (RENA-VIT) TABS tablet Take 1 tablet by mouth at bedtime. (2100)   Nutritional Supplements (ENSURE PLUS PO) Take 1 Can by mouth daily. 8pm   ondansetron (ZOFRAN) 4 MG tablet Take 1 tablet (4 mg total) by mouth every 6 (six) hours as needed for nausea.   ondansetron (ZOFRAN-ODT) 4 MG disintegrating tablet Take by mouth daily.   pantoprazole (PROTONIX) 40 MG tablet Take 40 mg by mouth 2 (two) times daily.   sertraline (ZOLOFT) 50 MG tablet Take 50  mg by mouth daily.   sucralfate (CARAFATE) 1 GM/10ML suspension Take 1 g by mouth in the morning, at noon, in  the evening, and at bedtime. (0800, 1200, 1700 & 2100)   UNABLE TO FIND Dysphagia 3 Diet with Thin liquids   [DISCONTINUED] denosumab (PROLIA) 60 MG/ML SOSY injection Inject 60 mg into the skin every 6 (six) months.   Facility-Administered Encounter Medications as of 05/26/2022  Medication   lanreotide acetate (SOMATULINE DEPOT) 120 MG/0.5ML injection   lanreotide acetate (SOMATULINE DEPOT) 120 MG/0.5ML injection   lanreotide acetate (SOMATULINE DEPOT) 120 MG/0.5ML injection   lanreotide acetate (SOMATULINE DEPOT) 120 MG/0.5ML injection   octreotide (SANDOSTATIN LAR) 30 MG IM injection   octreotide (SANDOSTATIN LAR) 30 MG IM injection     SIGNIFICANT DIAGNOSTIC EXAMS  PREVIOUS   10-01-21: chest x-ray: No evidence of active cardiopulmonary process.   10-01-21: abdominal ultrasound:  1. Cholelithiasis with no evidence of acute cholecystitis. 2. Increased echogenicity of the liver parenchyma, findings suggestive of hepatic steatosis.  10-01-21: ct of chest abdomen and pelvis  No aspiration pneumonia. Mild focal scarring at the medial left lung base.   Coronary artery calcification. Aortic atherosclerotic calcification. Aortic Atherosclerosis  Probably acute superior endplate fractures at 624THL, L1 and L2. No retropulsed bone.   Renal atrophy. Multiple renal cysts. Indeterminate 2.7 cm lesion at the lower pole the right kidney could be a complicated cyst or a mass. This could be evaluated with renal MRI. 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 Nonobstructing 5 mm stone in the lower pole of the left kidney.   1 x 2 cm cystic abnormality in the ventral head of the pancreas. Best practice recommendation is for reimaging of this every 6 months for 2 years and then every 1 year for 2 years and then every 2 years for 6 years. Follow-up can be discontinued if stable for 10 years. This  recommendation could be tailored to this patient's clinical status and life expectancy.  Paget's disease of the right hemipelvis. Diverticulosis without evidence of diverticulitis. Small hiatal hernia. Multiple calcified leiomyomas the uterus.  10-24-21: DEXA: t score -2.957  NO NEW EXAMS    LABS REVIEWED PREVIOUS    05-27-21: wbc 6.0; hgb 10.6; hct 33.5; mcv 101.2 plt 261; glucose 147; bun 45; creat 6.86; k+ 4.5; na++ 138; ca 8.3; GFR 6; protein 6.9; albumin 3.8  06-12-21: wbc 6.5; hgb 8.8; hct 27.7; mcv 101.5 plt 229; glucose 153; bun 40; creat 6.53; k+ 4.3; na++ 136; ca 9.5; GFR 6 protein 6.5; albumin 3.4 mag 2.2  07-07-21: chol 171; ldl 89; trig 265; hdl 29  07-08-21: wbc 15.7; hgb 9.8; hct 32.8; mcv 104.8; plt 226; glucose 164; bun 51; creat 7.74; k+ 4.4; na++ 138; ca 9.7; gfr 5 protein 7.5; albumin 3.9 mag 2.1; chol 171; ldl 89; trig 265; hdl 29; hgb a1c 6.1 08-12-21: wbc 6.3; hgb 10.0; hct 31.9; mcv 103.9 plt 215; glucose 197; bun 40;creat 6.25; k+ 4.2; na++138; ca 9.3; gfr 6; protein 6.7; albumin 3.7; iron 93; tibc 142; ferritin 312 09-30-21: wbc 6.6; hgb 9.6; hct 31.1; mcv 101.3 plt 232; glucose 222; bun 40; creat 6.78; k+ 4.0; na++ 136; ca 9.2; gfr 6; protein 6.8 albumin 3.7  10-01-21: wbc 13.0; hgb 9.1; hct 28.8; mcv 99.3 plt 251; glucose 198; bun 59; creat 9.01; k+ 3.7; na++ 135; ca 9.6; gfr 4; ast 212; alt 173; protein 6.7; albumin 3.6  10-03-21: wbc 7.2; hgb 8.6; hct 27.5; mcv 99.6 plt 179 glucose 105; bun 34; creat 7.04; k+ 3.6; na++ 135; ca 8.8; gfr 5 ast 45; alt 99; protein 6.5; albumin 3.5  10-10-21: hgb a1c 5.3  11-25-21: wbc 4.9; hgb 9.4; hct 30.7; mcv 100.7 plt 237; glucose 171; bun 27; creat 6.88; k+ 4.4; na++ 137; ca 9.0; gfr 6; protein 6.4 albumin 3.5  12-16-21: wbc 5.3; hgb 10.1; hct 33.0 mcv 98.5 plt 256; glucose 119; bun 24; creat 6.62; k+ 4.2 na++ 136; ca 9.3 gfr 6; protein 6.4 albumin 3.5; iron 54; tibc 140; ferritin 272  02-03-22: wbc 3.8; hgb 11.5; hct 37.6; mcv 96.4 plt 212;  glucose 164; bun 28; creat 7.99; k+ 4.4; na++ 139; ca 8.3 gfr 5; protein 6.7 albumin 3.7 mag 2.1 02-17-22: wbc 4.6; hgb 11.5; hct 36.5; mcv 93.6 plt 207; glucose 95; bun 23 creat 7.44 k+ 4.1; na++ 135; ca 7.8; gfr 5 protein 6.9; albumin 3.8  03-31-22: wbc 4.7; hgb 10.0; hct 32.2; mcv 96.4 plt 278; glucose 155; bun 28; creat 6.50; k+ 4.0; na++ 132; ca 8.0; gfr 6 protein 6.9 albumin 3.6  TODAY  05-19-22: wbc 4.1; hgb 9.6; hct 30.9; mcv 98.7 plt 236; glucose 170; bun 33; creat 6.03; k+ 3.9; na++ 137; ca 8.1; gfr 6; protein 6.6 albumin 3.4; mag 2.1    Review of Systems  Constitutional:  Negative for malaise/fatigue.  Respiratory:  Negative for cough and shortness of breath.   Cardiovascular:  Negative for chest pain, palpitations and leg swelling.  Gastrointestinal:  Negative for abdominal pain, constipation and heartburn.  Musculoskeletal:  Negative for back pain, joint pain and myalgias.  Skin: Negative.   Neurological:  Negative for dizziness.  Psychiatric/Behavioral:  The patient is not nervous/anxious.    Physical Exam Constitutional:      General: She is not in acute distress.    Appearance: She is well-developed. She is not diaphoretic.  Neck:     Thyroid: No thyromegaly.  Cardiovascular:     Rate and Rhythm: Normal rate and regular rhythm.     Pulses: Normal pulses.     Heart sounds: Normal heart sounds.  Pulmonary:     Effort: Pulmonary effort is normal. No respiratory distress.     Breath sounds: Normal breath sounds.  Abdominal:     General: Bowel sounds are normal. There is no distension.     Palpations: Abdomen is soft.     Tenderness: There is no abdominal tenderness.  Musculoskeletal:        General: Normal range of motion.     Cervical back: Neck supple.     Right lower leg: No edema.     Left lower leg: No edema.  Lymphadenopathy:     Cervical: No cervical adenopathy.  Skin:    General: Skin is warm and dry.     Comments: Chest wall dialysis access     Neurological:     Mental Status: She is alert. Mental status is at baseline.  Psychiatric:        Mood and Affect: Mood normal.         ASSESSMENT/PLAN  TODAY  Anemia due to end stage renal disease: hgb 9.6  2. Post menopausal osteoporosis: t score -2.957 will continue supplements  3. Stage 1 infiltrating ductal carcinoma of female right breast/status post right mastectomy will continue arimidex 1 mg daily  4. Multiple myeloma not having achieved remission: is followed by oncology    PREVIOUS   5. Herpes without outbreak will continue acyclovir 200 mg daily   6. Major depression chronic recurrent: will continue zoloft 75 mg daily   7. Protein calorie malnutrition: protein 6.6 albumin 3.4 will  continue prostat and supplements  8. Multiple gastric ulcers/duodenal ulcer/ upper GI bleed: esophageal reflux disease without esophagitis: will continue protonix 40 mg twice daily  zofran 4 mg ac  9. History of right hip fracture: will continue tylenol 650 mg three times daily   10. Chronic kidney disease with end stage renal disease on dialysis dye to type 2 diabetes mellitus:/dependence on dialysis: will continue dialysis three days week;h 1200 cc fluid restriction; calcium acetate 1334 mg with meals  11.Major depression chronic recurrent: will continue zoloft 75 mg daily   12. Protein calorie malnutrition: protein 6.6 albumin 3.4 will continue prostat and supplements  13. Multiple gastric ulcers/duodenal ulcer/ upper GI bleed: esophageal reflux disease without esophagitis: will continue protonix 40 mg twice daily will change to zofran 4 mg ac  14. History of right hip fracture: will continue tylenol 650 mg three times daily   15. Chronic kidney disease with end stage renal disease on dialysis dye to type 2 diabetes mellitus:/dependence on dialysis: will continue dialysis three days week;h 1200 cc fluid restriction; calcium acetate 1334 mg with meals  16.Hypertension due to end  stage renal disease (ESRD) due to type 2 diabetes mellitus: b/p 148/76 is off tenormin and clonidine  17. Hyperlipidemia associated with type 2 diabetes mellitus: ldl 89; is off lipitor elevated liver enzymes  18. Aortic atherosclerosis / atherosclerotic peripheral vascular disease (ct 10-04-18) ldl 89  19. Vascular dementia with behavioral disturbance: weight is 139 pounds  20. Diabetes mellitus type 2 with end stage renal disease (ESRD) hgb A1c 6.1 is presently off medications.    Ok Edwards NP [facility]  call [phone]

## 2022-05-27 DIAGNOSIS — Z992 Dependence on renal dialysis: Secondary | ICD-10-CM | POA: Diagnosis not present

## 2022-05-27 DIAGNOSIS — N186 End stage renal disease: Secondary | ICD-10-CM | POA: Diagnosis not present

## 2022-05-27 DIAGNOSIS — N2581 Secondary hyperparathyroidism of renal origin: Secondary | ICD-10-CM | POA: Diagnosis not present

## 2022-05-29 DIAGNOSIS — Z992 Dependence on renal dialysis: Secondary | ICD-10-CM | POA: Diagnosis not present

## 2022-05-29 DIAGNOSIS — N186 End stage renal disease: Secondary | ICD-10-CM | POA: Diagnosis not present

## 2022-05-29 DIAGNOSIS — N2581 Secondary hyperparathyroidism of renal origin: Secondary | ICD-10-CM | POA: Diagnosis not present

## 2022-06-01 ENCOUNTER — Other Ambulatory Visit: Payer: Self-pay

## 2022-06-01 DIAGNOSIS — Z992 Dependence on renal dialysis: Secondary | ICD-10-CM | POA: Diagnosis not present

## 2022-06-01 DIAGNOSIS — N2581 Secondary hyperparathyroidism of renal origin: Secondary | ICD-10-CM | POA: Diagnosis not present

## 2022-06-01 DIAGNOSIS — N186 End stage renal disease: Secondary | ICD-10-CM | POA: Diagnosis not present

## 2022-06-01 DIAGNOSIS — C9000 Multiple myeloma not having achieved remission: Secondary | ICD-10-CM

## 2022-06-02 ENCOUNTER — Inpatient Hospital Stay: Payer: Medicare PPO

## 2022-06-02 VITALS — BP 152/75 | HR 97 | Temp 99.1°F | Resp 20 | Wt 137.6 lb

## 2022-06-02 DIAGNOSIS — C9000 Multiple myeloma not having achieved remission: Secondary | ICD-10-CM | POA: Diagnosis not present

## 2022-06-02 DIAGNOSIS — Z5112 Encounter for antineoplastic immunotherapy: Secondary | ICD-10-CM | POA: Diagnosis not present

## 2022-06-02 LAB — CBC WITH DIFFERENTIAL/PLATELET
Abs Immature Granulocytes: 0.02 10*3/uL (ref 0.00–0.07)
Basophils Absolute: 0 10*3/uL (ref 0.0–0.1)
Basophils Relative: 1 %
Eosinophils Absolute: 0.2 10*3/uL (ref 0.0–0.5)
Eosinophils Relative: 4 %
HCT: 29.2 % — ABNORMAL LOW (ref 36.0–46.0)
Hemoglobin: 9.3 g/dL — ABNORMAL LOW (ref 12.0–15.0)
Immature Granulocytes: 0 %
Lymphocytes Relative: 20 %
Lymphs Abs: 0.9 10*3/uL (ref 0.7–4.0)
MCH: 31.4 pg (ref 26.0–34.0)
MCHC: 31.8 g/dL (ref 30.0–36.0)
MCV: 98.6 fL (ref 80.0–100.0)
Monocytes Absolute: 0.5 10*3/uL (ref 0.1–1.0)
Monocytes Relative: 12 %
Neutro Abs: 2.8 10*3/uL (ref 1.7–7.7)
Neutrophils Relative %: 63 %
Platelets: 228 10*3/uL (ref 150–400)
RBC: 2.96 MIL/uL — ABNORMAL LOW (ref 3.87–5.11)
RDW: 16.4 % — ABNORMAL HIGH (ref 11.5–15.5)
WBC: 4.5 10*3/uL (ref 4.0–10.5)
nRBC: 0 % (ref 0.0–0.2)

## 2022-06-02 LAB — COMPREHENSIVE METABOLIC PANEL
ALT: 10 U/L (ref 0–44)
AST: 16 U/L (ref 15–41)
Albumin: 3.4 g/dL — ABNORMAL LOW (ref 3.5–5.0)
Alkaline Phosphatase: 52 U/L (ref 38–126)
Anion gap: 13 (ref 5–15)
BUN: 32 mg/dL — ABNORMAL HIGH (ref 8–23)
CO2: 27 mmol/L (ref 22–32)
Calcium: 8.5 mg/dL — ABNORMAL LOW (ref 8.9–10.3)
Chloride: 95 mmol/L — ABNORMAL LOW (ref 98–111)
Creatinine, Ser: 6.1 mg/dL — ABNORMAL HIGH (ref 0.44–1.00)
GFR, Estimated: 6 mL/min — ABNORMAL LOW (ref >60)
Glucose, Bld: 116 mg/dL — ABNORMAL HIGH (ref 70–99)
Potassium: 4.1 mmol/L (ref 3.5–5.1)
Sodium: 135 mmol/L (ref 135–145)
Total Bilirubin: 0.5 mg/dL (ref 0.3–1.2)
Total Protein: 6.4 g/dL — ABNORMAL LOW (ref 6.5–8.1)

## 2022-06-02 LAB — MAGNESIUM: Magnesium: 2.2 mg/dL (ref 1.7–2.4)

## 2022-06-02 MED ORDER — BORTEZOMIB CHEMO SQ INJECTION 3.5 MG (2.5MG/ML)
1.2000 mg/m2 | Freq: Once | INTRAMUSCULAR | Status: AC
  Administered 2022-06-02: 2 mg via SUBCUTANEOUS
  Filled 2022-06-02: qty 0.8

## 2022-06-02 MED ORDER — PROCHLORPERAZINE MALEATE 10 MG PO TABS
10.0000 mg | ORAL_TABLET | Freq: Once | ORAL | Status: AC
  Administered 2022-06-02: 10 mg via ORAL
  Filled 2022-06-02: qty 1

## 2022-06-02 NOTE — Progress Notes (Signed)
Pt presents today for Velcade per provider's order. Vital signs and labs WNL for treatment. Okay to proceed with treatment today.  Velcade given today per MD orders. Tolerated infusion without adverse affects. Vital signs stable. No complaints at this time. Discharged from clinic via wheelchair in stable condition. Alert and oriented x 3. F/U with [facility] as scheduled.   

## 2022-06-02 NOTE — Patient Instructions (Signed)
[facility]  Discharge Instructions: Thank you for choosing [facility] to provide your oncology and hematology care.  If you have a lab appointment with the Cancer Center, please come in thru the Main Entrance and check in at the main information desk.  Wear comfortable clothing and clothing appropriate for easy access to any Portacath or PICC line.   We strive to give you quality time with your provider. You may need to reschedule your appointment if you arrive late (15 or more minutes).  Arriving late affects you and other patients whose appointments are after yours.  Also, if you miss three or more appointments without notifying the office, you may be dismissed from the clinic at the provider's discretion.      For prescription refill requests, have your pharmacy contact our office and allow 72 hours for refills to be completed.    Today you received the following chemotherapy and/or immunotherapy agents Velcade   To help prevent nausea and vomiting after your treatment, we encourage you to take your nausea medication as directed.  Bortezomib Injection What is this medication? BORTEZOMIB (bor TEZ oh mib) treats lymphoma. It may also be used to treat multiple myeloma, a type of bone marrow cancer. It works by blocking a protein that causes cancer cells to grow and multiply. This helps to slow or stop the spread of cancer cells. This medicine may be used for other purposes; ask your health care provider or pharmacist if you have questions. COMMON BRAND NAME(S): Velcade What should I tell my care team before I take this medication? They need to know if you have any of these conditions: Dehydration Diabetes Heart disease Liver disease Tingling of the fingers or toes or other nerve disorder An unusual or allergic reaction to bortezomib, other medications, foods, dyes, or preservatives If you or your partner are pregnant or trying to get  pregnant Breastfeeding How should I use this medication? This medication is injected into a vein or under the skin. It is given by your care team in a hospital or clinic setting. Talk to your care team about the use of this medication in children. Special care may be needed. Overdosage: If you think you have taken too much of this medicine contact a poison control center or emergency room at once. NOTE: This medicine is only for you. Do not share this medicine with others. What if I miss a dose? Keep appointments for follow-up doses. It is important not to miss your dose. Call your care team if you are unable to keep an appointment. What may interact with this medication? Ketoconazole Rifampin This list may not describe all possible interactions. Give your health care provider a list of all the medicines, herbs, non-prescription drugs, or dietary supplements you use. Also tell them if you smoke, drink alcohol, or use illegal drugs. Some items may interact with your medicine. What should I watch for while using this medication? Your condition will be monitored carefully while you are receiving this medication. You may need blood work while taking this medication. This medication may affect your coordination, reaction time, or judgment. Do not drive or operate machinery until you know how this medication affects you. Sit up or stand slowly to reduce the risk of dizzy or fainting spells. Drinking alcohol with this medication can increase the risk of these side effects. This medication may increase your risk of getting an infection. Call your care team for advice if you get a   fever, chills, sore throat, or other symptoms of a cold or flu. Do not treat yourself. Try to avoid being around people who are sick. Check with your care team if you have severe diarrhea, nausea, and vomiting, or if you sweat a lot. The loss of too much body fluid may make it dangerous for you to take this medication. Talk to  your care team if you may be pregnant. Serious birth defects can occur if you take this medication during pregnancy and for 7 months after the last dose. You will need a negative pregnancy test before starting this medication. Contraception is recommended while taking this medication and for 7 months after the last dose. Your care team can help you find the option that works for you. If your partner can get pregnant, use a condom during sex while taking this medication and for 4 months after the last dose. Do not breastfeed while taking this medication and for 2 months after the last dose. This medication may cause infertility. Talk to your care team if you are concerned about your fertility. What side effects may I notice from receiving this medication? Side effects that you should report to your care team as soon as possible: Allergic reactions--skin rash, itching, hives, swelling of the face, lips, tongue, or throat Bleeding--bloody or black, tar-like stools, vomiting blood or brown material that looks like coffee grounds, red or dark brown urine, small red or purple spots on skin, unusual bruising or bleeding Bleeding in the brain--severe headache, stiff neck, confusion, dizziness, change in vision, numbness or weakness of the face, arm, or leg, trouble speaking, trouble walking, vomiting Bowel blockage--stomach cramping, unable to have a bowel movement or pass gas, loss of appetite, vomiting Heart failure--shortness of breath, swelling of the ankles, feet, or hands, sudden weight gain, unusual weakness or fatigue Infection--fever, chills, cough, sore throat, wounds that don't heal, pain or trouble when passing urine, general feeling of discomfort or being unwell Liver injury--right upper belly pain, loss of appetite, nausea, light-colored stool, dark yellow or brown urine, yellowing skin or eyes, unusual weakness or fatigue Low blood pressure--dizziness, feeling faint or lightheaded, blurry  vision Lung injury--shortness of breath or trouble breathing, cough, spitting up blood, chest pain, fever Pain, tingling, or numbness in the hands or feet Severe or prolonged diarrhea Stomach pain, bloody diarrhea, pale skin, unusual weakness or fatigue, decrease in the amount of urine, which may be signs of hemolytic uremic syndrome Sudden and severe headache, confusion, change in vision, seizures, which may be signs of posterior reversible encephalopathy syndrome (PRES) TTP--purple spots on the skin or inside the mouth, pale skin, yellowing skin or eyes, unusual weakness or fatigue, fever, fast or irregular heartbeat, confusion, change in vision, trouble speaking, trouble walking Tumor lysis syndrome (TLS)--nausea, vomiting, diarrhea, decrease in the amount of urine, dark urine, unusual weakness or fatigue, confusion, muscle pain or cramps, fast or irregular heartbeat, joint pain Side effects that usually do not require medical attention (report to your care team if they continue or are bothersome): Constipation Diarrhea Fatigue Loss of appetite Nausea This list may not describe all possible side effects. Call your doctor for medical advice about side effects. You may report side effects to FDA at 1-800-FDA-1088. Where should I keep my medication? This medication is given in a hospital or clinic. It will not be stored at home. NOTE: This sheet is a summary. It may not cover all possible information. If you have questions about this medicine, talk to your   doctor, pharmacist, or health care provider.  2023 Elsevier/Gold Standard (2021-07-23 00:00:00)   BELOW ARE SYMPTOMS THAT SHOULD BE REPORTED IMMEDIATELY: *FEVER GREATER THAN 100.4 F (38 C) OR HIGHER *CHILLS OR SWEATING *NAUSEA AND VOMITING THAT IS NOT CONTROLLED WITH YOUR NAUSEA MEDICATION *UNUSUAL SHORTNESS OF BREATH *UNUSUAL BRUISING OR BLEEDING *URINARY PROBLEMS (pain or burning when urinating, or frequent urination) *BOWEL  PROBLEMS (unusual diarrhea, constipation, pain near the anus) TENDERNESS IN MOUTH AND THROAT WITH OR WITHOUT PRESENCE OF ULCERS (sore throat, sores in mouth, or a toothache) UNUSUAL RASH, SWELLING OR PAIN  UNUSUAL VAGINAL DISCHARGE OR ITCHING   Items with * indicate a potential emergency and should be followed up as soon as possible or go to the Emergency Department if any problems should occur.  Please show the CHEMOTHERAPY ALERT CARD or IMMUNOTHERAPY ALERT CARD at check-in to the Emergency Department and triage nurse.  Should you have questions after your visit or need to cancel or reschedule your appointment, please contact [facility] [phone]  and follow the prompts.  Office hours are 8:00 a.m. to 4:30 p.m. Monday - Friday. Please note that voicemails left after 4:00 p.m. may not be returned until the following business day.  We are closed weekends and major holidays. You have access to a nurse at all times for urgent questions. Please call the main number to the clinic [phone] and follow the prompts.  For any non-urgent questions, you may also contact your provider using MyChart. We now offer e-Visits for anyone 18 and older to request care online for non-urgent symptoms. For details visit [email or web address].   Also download the MyChart app! Go to the app store, search "MyChart", open the app, select [facility], and log in with your MyChart username and password.   

## 2022-06-03 DIAGNOSIS — N186 End stage renal disease: Secondary | ICD-10-CM | POA: Diagnosis not present

## 2022-06-03 DIAGNOSIS — N2581 Secondary hyperparathyroidism of renal origin: Secondary | ICD-10-CM | POA: Diagnosis not present

## 2022-06-03 DIAGNOSIS — Z992 Dependence on renal dialysis: Secondary | ICD-10-CM | POA: Diagnosis not present

## 2022-06-05 DIAGNOSIS — N186 End stage renal disease: Secondary | ICD-10-CM | POA: Diagnosis not present

## 2022-06-05 DIAGNOSIS — Z992 Dependence on renal dialysis: Secondary | ICD-10-CM | POA: Diagnosis not present

## 2022-06-05 DIAGNOSIS — N2581 Secondary hyperparathyroidism of renal origin: Secondary | ICD-10-CM | POA: Diagnosis not present

## 2022-06-07 DIAGNOSIS — N186 End stage renal disease: Secondary | ICD-10-CM | POA: Diagnosis not present

## 2022-06-07 DIAGNOSIS — Z992 Dependence on renal dialysis: Secondary | ICD-10-CM | POA: Diagnosis not present

## 2022-06-08 DIAGNOSIS — N2581 Secondary hyperparathyroidism of renal origin: Secondary | ICD-10-CM | POA: Diagnosis not present

## 2022-06-08 DIAGNOSIS — N186 End stage renal disease: Secondary | ICD-10-CM | POA: Diagnosis not present

## 2022-06-08 DIAGNOSIS — Z992 Dependence on renal dialysis: Secondary | ICD-10-CM | POA: Diagnosis not present

## 2022-06-10 DIAGNOSIS — N2581 Secondary hyperparathyroidism of renal origin: Secondary | ICD-10-CM | POA: Diagnosis not present

## 2022-06-10 DIAGNOSIS — N186 End stage renal disease: Secondary | ICD-10-CM | POA: Diagnosis not present

## 2022-06-10 DIAGNOSIS — Z992 Dependence on renal dialysis: Secondary | ICD-10-CM | POA: Diagnosis not present

## 2022-06-12 DIAGNOSIS — N186 End stage renal disease: Secondary | ICD-10-CM | POA: Diagnosis not present

## 2022-06-12 DIAGNOSIS — Z992 Dependence on renal dialysis: Secondary | ICD-10-CM | POA: Diagnosis not present

## 2022-06-12 DIAGNOSIS — N2581 Secondary hyperparathyroidism of renal origin: Secondary | ICD-10-CM | POA: Diagnosis not present

## 2022-06-15 DIAGNOSIS — N186 End stage renal disease: Secondary | ICD-10-CM | POA: Diagnosis not present

## 2022-06-15 DIAGNOSIS — N2581 Secondary hyperparathyroidism of renal origin: Secondary | ICD-10-CM | POA: Diagnosis not present

## 2022-06-15 DIAGNOSIS — Z992 Dependence on renal dialysis: Secondary | ICD-10-CM | POA: Diagnosis not present

## 2022-06-17 DIAGNOSIS — N2581 Secondary hyperparathyroidism of renal origin: Secondary | ICD-10-CM | POA: Diagnosis not present

## 2022-06-17 DIAGNOSIS — N186 End stage renal disease: Secondary | ICD-10-CM | POA: Diagnosis not present

## 2022-06-17 DIAGNOSIS — Z992 Dependence on renal dialysis: Secondary | ICD-10-CM | POA: Diagnosis not present

## 2022-06-19 DIAGNOSIS — Z992 Dependence on renal dialysis: Secondary | ICD-10-CM | POA: Diagnosis not present

## 2022-06-19 DIAGNOSIS — N2581 Secondary hyperparathyroidism of renal origin: Secondary | ICD-10-CM | POA: Diagnosis not present

## 2022-06-19 DIAGNOSIS — N186 End stage renal disease: Secondary | ICD-10-CM | POA: Diagnosis not present

## 2022-06-22 DIAGNOSIS — H40013 Open angle with borderline findings, low risk, bilateral: Secondary | ICD-10-CM | POA: Diagnosis not present

## 2022-06-22 DIAGNOSIS — N2581 Secondary hyperparathyroidism of renal origin: Secondary | ICD-10-CM | POA: Diagnosis not present

## 2022-06-22 DIAGNOSIS — E119 Type 2 diabetes mellitus without complications: Secondary | ICD-10-CM | POA: Diagnosis not present

## 2022-06-22 DIAGNOSIS — Z992 Dependence on renal dialysis: Secondary | ICD-10-CM | POA: Diagnosis not present

## 2022-06-22 DIAGNOSIS — N186 End stage renal disease: Secondary | ICD-10-CM | POA: Diagnosis not present

## 2022-06-22 DIAGNOSIS — H25812 Combined forms of age-related cataract, left eye: Secondary | ICD-10-CM | POA: Diagnosis not present

## 2022-06-22 DIAGNOSIS — Z961 Presence of intraocular lens: Secondary | ICD-10-CM | POA: Diagnosis not present

## 2022-06-23 ENCOUNTER — Inpatient Hospital Stay: Payer: Medicare PPO

## 2022-06-23 ENCOUNTER — Inpatient Hospital Stay: Payer: Medicare PPO | Attending: Hematology

## 2022-06-23 VITALS — BP 159/81 | HR 110 | Temp 98.4°F | Resp 20 | Wt 145.4 lb

## 2022-06-23 DIAGNOSIS — Z79811 Long term (current) use of aromatase inhibitors: Secondary | ICD-10-CM | POA: Insufficient documentation

## 2022-06-23 DIAGNOSIS — Z9011 Acquired absence of right breast and nipple: Secondary | ICD-10-CM | POA: Insufficient documentation

## 2022-06-23 DIAGNOSIS — C9000 Multiple myeloma not having achieved remission: Secondary | ICD-10-CM

## 2022-06-23 DIAGNOSIS — N186 End stage renal disease: Secondary | ICD-10-CM | POA: Insufficient documentation

## 2022-06-23 DIAGNOSIS — Z170 Estrogen receptor positive status [ER+]: Secondary | ICD-10-CM | POA: Diagnosis not present

## 2022-06-23 DIAGNOSIS — Z992 Dependence on renal dialysis: Secondary | ICD-10-CM | POA: Diagnosis not present

## 2022-06-23 DIAGNOSIS — Z5112 Encounter for antineoplastic immunotherapy: Secondary | ICD-10-CM | POA: Insufficient documentation

## 2022-06-23 DIAGNOSIS — M8580 Other specified disorders of bone density and structure, unspecified site: Secondary | ICD-10-CM | POA: Insufficient documentation

## 2022-06-23 DIAGNOSIS — C50911 Malignant neoplasm of unspecified site of right female breast: Secondary | ICD-10-CM | POA: Diagnosis not present

## 2022-06-23 DIAGNOSIS — Z79899 Other long term (current) drug therapy: Secondary | ICD-10-CM | POA: Diagnosis not present

## 2022-06-23 LAB — CBC WITH DIFFERENTIAL/PLATELET
Abs Immature Granulocytes: 0.01 10*3/uL (ref 0.00–0.07)
Basophils Absolute: 0 10*3/uL (ref 0.0–0.1)
Basophils Relative: 0 %
Eosinophils Absolute: 0.2 10*3/uL (ref 0.0–0.5)
Eosinophils Relative: 4 %
HCT: 28.7 % — ABNORMAL LOW (ref 36.0–46.0)
Hemoglobin: 9.1 g/dL — ABNORMAL LOW (ref 12.0–15.0)
Immature Granulocytes: 0 %
Lymphocytes Relative: 20 %
Lymphs Abs: 1 10*3/uL (ref 0.7–4.0)
MCH: 32.2 pg (ref 26.0–34.0)
MCHC: 31.7 g/dL (ref 30.0–36.0)
MCV: 101.4 fL — ABNORMAL HIGH (ref 80.0–100.0)
Monocytes Absolute: 0.5 10*3/uL (ref 0.1–1.0)
Monocytes Relative: 10 %
Neutro Abs: 3.3 10*3/uL (ref 1.7–7.7)
Neutrophils Relative %: 66 %
Platelets: 286 10*3/uL (ref 150–400)
RBC: 2.83 MIL/uL — ABNORMAL LOW (ref 3.87–5.11)
RDW: 17 % — ABNORMAL HIGH (ref 11.5–15.5)
WBC: 4.9 10*3/uL (ref 4.0–10.5)
nRBC: 0 % (ref 0.0–0.2)

## 2022-06-23 LAB — MAGNESIUM: Magnesium: 2.2 mg/dL (ref 1.7–2.4)

## 2022-06-23 LAB — COMPREHENSIVE METABOLIC PANEL
ALT: 10 U/L (ref 0–44)
AST: 19 U/L (ref 15–41)
Albumin: 3.3 g/dL — ABNORMAL LOW (ref 3.5–5.0)
Alkaline Phosphatase: 54 U/L (ref 38–126)
Anion gap: 11 (ref 5–15)
BUN: 25 mg/dL — ABNORMAL HIGH (ref 8–23)
CO2: 27 mmol/L (ref 22–32)
Calcium: 8.6 mg/dL — ABNORMAL LOW (ref 8.9–10.3)
Chloride: 98 mmol/L (ref 98–111)
Creatinine, Ser: 5.85 mg/dL — ABNORMAL HIGH (ref 0.44–1.00)
GFR, Estimated: 7 mL/min — ABNORMAL LOW (ref >60)
Glucose, Bld: 158 mg/dL — ABNORMAL HIGH (ref 70–99)
Potassium: 3.9 mmol/L (ref 3.5–5.1)
Sodium: 136 mmol/L (ref 135–145)
Total Bilirubin: <0.1 mg/dL — ABNORMAL LOW (ref 0.3–1.2)
Total Protein: 6.8 g/dL (ref 6.5–8.1)

## 2022-06-23 MED ORDER — PROCHLORPERAZINE MALEATE 10 MG PO TABS
10.0000 mg | ORAL_TABLET | Freq: Once | ORAL | Status: AC
  Administered 2022-06-23: 10 mg via ORAL
  Filled 2022-06-23: qty 1

## 2022-06-23 MED ORDER — BORTEZOMIB CHEMO SQ INJECTION 3.5 MG (2.5MG/ML)
1.2000 mg/m2 | Freq: Once | INTRAMUSCULAR | Status: AC
  Administered 2022-06-23: 2 mg via SUBCUTANEOUS
  Filled 2022-06-23: qty 0.8

## 2022-06-23 NOTE — Progress Notes (Signed)
Patient presents today for Velcade chemotherapy infusion. Patient is in satisfactory condition with no new complaints voiced.  Blood pressure 177/98 and HR 110 upon arrival., both rechecked with improvement of blood pressure 159/81 but patient remains tachycardiac at 115. Dr Ellin Saba made aware of patient's vital signs. Patient still able to receive Velcade today per Dr Marice Potter approval.  Labs reviewed and all labs are within treatment parameters with exception of Creatinine of 6.1. Patient is a dialysis patient. We will proceed with treatment per MD orders.

## 2022-06-24 DIAGNOSIS — N2581 Secondary hyperparathyroidism of renal origin: Secondary | ICD-10-CM | POA: Diagnosis not present

## 2022-06-24 DIAGNOSIS — Z992 Dependence on renal dialysis: Secondary | ICD-10-CM | POA: Diagnosis not present

## 2022-06-24 DIAGNOSIS — N186 End stage renal disease: Secondary | ICD-10-CM | POA: Diagnosis not present

## 2022-06-25 LAB — KAPPA/LAMBDA LIGHT CHAINS
Kappa free light chain: 159.7 mg/L — ABNORMAL HIGH (ref 3.3–19.4)
Kappa, lambda light chain ratio: 0.3 (ref 0.26–1.65)
Lambda free light chains: 528.8 mg/L — ABNORMAL HIGH (ref 5.7–26.3)

## 2022-06-26 DIAGNOSIS — N2581 Secondary hyperparathyroidism of renal origin: Secondary | ICD-10-CM | POA: Diagnosis not present

## 2022-06-26 DIAGNOSIS — N186 End stage renal disease: Secondary | ICD-10-CM | POA: Diagnosis not present

## 2022-06-26 DIAGNOSIS — Z992 Dependence on renal dialysis: Secondary | ICD-10-CM | POA: Diagnosis not present

## 2022-06-26 LAB — PROTEIN ELECTROPHORESIS, SERUM
A/G Ratio: 1.3 (ref 0.7–1.7)
Albumin ELP: 3.5 g/dL (ref 2.9–4.4)
Alpha-1-Globulin: 0.3 g/dL (ref 0.0–0.4)
Alpha-2-Globulin: 0.8 g/dL (ref 0.4–1.0)
Beta Globulin: 0.9 g/dL (ref 0.7–1.3)
Gamma Globulin: 0.6 g/dL (ref 0.4–1.8)
Globulin, Total: 2.6 g/dL (ref 2.2–3.9)
Total Protein ELP: 6.1 g/dL (ref 6.0–8.5)

## 2022-06-29 ENCOUNTER — Non-Acute Institutional Stay (SKILLED_NURSING_FACILITY): Payer: Medicare PPO | Admitting: Adult Health

## 2022-06-29 ENCOUNTER — Encounter: Payer: Self-pay | Admitting: Adult Health

## 2022-06-29 DIAGNOSIS — K269 Duodenal ulcer, unspecified as acute or chronic, without hemorrhage or perforation: Secondary | ICD-10-CM

## 2022-06-29 DIAGNOSIS — B009 Herpesviral infection, unspecified: Secondary | ICD-10-CM

## 2022-06-29 DIAGNOSIS — E43 Unspecified severe protein-calorie malnutrition: Secondary | ICD-10-CM

## 2022-06-29 DIAGNOSIS — Z992 Dependence on renal dialysis: Secondary | ICD-10-CM | POA: Diagnosis not present

## 2022-06-29 DIAGNOSIS — N2581 Secondary hyperparathyroidism of renal origin: Secondary | ICD-10-CM | POA: Diagnosis not present

## 2022-06-29 DIAGNOSIS — K219 Gastro-esophageal reflux disease without esophagitis: Secondary | ICD-10-CM | POA: Diagnosis not present

## 2022-06-29 DIAGNOSIS — K259 Gastric ulcer, unspecified as acute or chronic, without hemorrhage or perforation: Secondary | ICD-10-CM | POA: Diagnosis not present

## 2022-06-29 DIAGNOSIS — N186 End stage renal disease: Secondary | ICD-10-CM | POA: Diagnosis not present

## 2022-06-29 NOTE — Progress Notes (Unsigned)
Location:  [facility] Room Number: 148 Place of Service:  SNF (31)   CODE STATUS: DNR  Allergies  Allergen Reactions   Ace Inhibitors Other (See Comments) and Cough    Tongue swell , ie angioedema   Angiotensin Receptor Blockers     Angioedema with ACE-I   Penicillins Other (See Comments)    Unsure of reaction Has patient had a PCN reaction causing immediate rash, facial/tongue/throat swelling, SOB or lightheadedness with hypotension: Unknown Has patient had a PCN reaction causing severe rash involving mucus membranes or skin necrosis: Unknown Has patient had a PCN reaction that required hospitalization: No Has patient had a PCN reaction occurring within the last 10 years: Unknown If all of the above answers are "NO", then may proceed with Cephalosporin use.     Penicillin G     Chief Complaint  Patient presents with   Medical Management of Chronic Issues              Herpes:  Major depression chronic recurrent:Protein calorie malnutrition: Multiple gastric ulcers/duodenal ulcer/upper GI bleed/esophageal reflux disease without esophagitis     HPI:  She is a 87 year old long term resident of this facility being seen for the management of her chronic illnesses:   Herpes:  Major depression chronic recurrent:Protein calorie malnutrition: Multiple gastric ulcers/duodenal ulcer/upper GI bleed/esophageal reflux disease without esophagitis.  There are no reports of uncontrolled pain. She continues with hemodialysis three days weekly. Her weight is stable. There are no indications of bleeding present.   Past Medical History:  Diagnosis Date   Anemia     chronic macrocytic anemia   Anxiety    Chronic kidney disease    Chronic renal disease, stage 4, severely decreased glomerular filtration rate (GFR) between 15-29 mL/min/1.73 square meter 08/22/2015   Complication of anesthesia    delirious after Breast Surgery   Dementia    mild   Depression    Diabetes  mellitus with ESRD (end-stage renal disease)    type II   Dysphagia    Dyspnea    with activity   GERD (gastroesophageal reflux disease)    Glaucoma    Hyperlipidemia    Hypertension    Multiple myeloma    Pneumonia    Stage 1 infiltrating ductal carcinoma of right female breast 08/21/2015   ER+ PR+ HER 2 neu + (3+) T1cN0     Past Surgical History:  Procedure Laterality Date   A/V FISTULAGRAM Right 07/05/2020   Procedure: A/V Fistulagram;  Surgeon: Sherren Kerns, MD;  [facility]: [facility];  Service: Cardiovascular;  Laterality: Right;   AV FISTULA PLACEMENT Left 11/22/2017   Procedure: ARTERIOVENOUS (AV) FISTULA CREATION LEFT ARM;  Surgeon: Sherren Kerns, MD;  Location: [facility] OR;  Service: Vascular;  Laterality: Left;   AV FISTULA PLACEMENT Right 04/04/2020   Procedure: RIGHT ARM ARTERIOVENOUS FISTULA CREATION;  Surgeon: Larina Earthly, MD;  Location: [facility];  Service: Vascular;  Laterality: Right;   AV FISTULA PLACEMENT Right 08/20/2020   Procedure: ARTERIOVENOUS (AV) FISTULA LIGATION RIGHT ARM;  Surgeon: Sherren Kerns, MD;  Location: [facility] OR;  Service: Vascular;  Laterality: Right;   BIOPSY  08/07/2016   Procedure: BIOPSY;  Surgeon: Corbin Ade, MD;  Location: [facility];  Service: Endoscopy;;  gastric ulcer biopsy   COLONOSCOPY     ESOPHAGOGASTRODUODENOSCOPY N/A 08/07/2016   LA Grade A esophagitis s/p dilation, small hiatal hernia, multiple gastric ulcers and erosions, duodenal  erosions s/p biopsy. Negative H.pylori    ESOPHAGOGASTRODUODENOSCOPY N/A 11/27/2016   normal esophagus, previously noted gastric ulcers completely healed, normal duodenum.    ESOPHAGOGASTRODUODENOSCOPY (EGD) WITH PROPOFOL N/A 07/23/2020   Procedure: ESOPHAGOGASTRODUODENOSCOPY (EGD) WITH PROPOFOL;  Surgeon: Malissa Hippo, MD;  [facility]: [facility];  Service: Endoscopy;  Laterality: N/A;   FISTULA SUPERFICIALIZATION Left 02/14/2018   Procedure: FISTULA SUPERFICIALIZATION LEFT ARM;   Surgeon: Chuck Hint, MD;  Location: [facility] OR;  Service: Vascular;  Laterality: Left;   FRACTURE SURGERY Right    ankle   HOT HEMOSTASIS  07/23/2020   Procedure: HOT HEMOSTASIS (ARGON PLASMA COAGULATION/BICAP);  Surgeon: Malissa Hippo, MD;  [facility]: [facility];  Service: Endoscopy;;   INTRAMEDULLARY (IM) NAIL INTERTROCHANTERIC Right 07/12/2020   Procedure: INTRAMEDULLARY (IM) NAIL INTERTROCHANTRIC;  Surgeon: Oliver Barre, MD;  Location: [facility];  Service: Orthopedics;  Laterality: Right;   MALONEY DILATION N/A 08/07/2016   Procedure: Elease Hashimoto DILATION;  Surgeon: Corbin Ade, MD;  [facility]: [facility];  Service: Endoscopy;  Laterality: N/A;   MASTECTOMY, PARTIAL Right    PERIPHERAL VASCULAR BALLOON ANGIOPLASTY Left 07/13/2019   Procedure: PERIPHERAL VASCULAR BALLOON ANGIOPLASTY;  Surgeon: Cephus Shelling, MD;  [facility]: [facility];  Service: Cardiovascular;  Laterality: Left;  arm fistulogram   PERIPHERAL VASCULAR BALLOON ANGIOPLASTY Right 05/22/2020   Procedure: PERIPHERAL VASCULAR BALLOON ANGIOPLASTY;  Surgeon: Leonie Douglas, MD;  [facility]: [facility];  Service: Cardiovascular;  Laterality: Right;  arm fistula   PERIPHERAL VASCULAR BALLOON ANGIOPLASTY Right 07/05/2020   Procedure: PERIPHERAL VASCULAR BALLOON ANGIOPLASTY;  Surgeon: Sherren Kerns, MD;  [facility]: [facility];  Service: Cardiovascular;  Laterality: Right;  arm fistula   PORT-A-CATH REMOVAL Left 11/22/2017   Procedure: REMOVAL PORT-A-CATH LEFT CHEST;  Surgeon: Sherren Kerns, MD;  Location: [facility] OR;  Service: Vascular;  Laterality: Left;   RETINAL DETACHMENT SURGERY Right    SCLEROTHERAPY  07/23/2020   Procedure: SCLEROTHERAPY;  Surgeon: Malissa Hippo, MD;  Location: [facility];  Service: Endoscopy;;   UPPER EXTREMITY VENOGRAPHY N/A 08/22/2020   Procedure: LEFT UPPER & CENTRAL VENOGRAPHY;  Surgeon: Cephus Shelling, MD;  Location: [facility];  Service:  Cardiovascular;  Laterality: N/A;    Social History   Socioeconomic History   Marital status: Single    Spouse name: Not on file   Number of children: Not on file   Years of education: Not on file   Highest education level: Not on file  Occupational History   Occupation: retired   Tobacco Use   Smoking status: Never   Smokeless tobacco: Never  Vaping Use   Vaping Use: Never used  Substance and Sexual Activity   Alcohol use: No    Alcohol/week: 0.0 standard drinks of alcohol   Drug use: No   Sexual activity: Never  Other Topics Concern   Not on file  Social History Narrative   Long term resident of SNF    Social Determinants of Health   Financial Resource Strain: Low Risk  (01/09/2020)   Overall Financial Resource Strain (CARDIA)    Difficulty of Paying Living Expenses: Not very hard  Food Insecurity: No Food Insecurity (01/09/2020)   Hunger Vital Sign    Worried About Running Out of Food in the Last Year: Never true    Ran Out of Food in the Last Year: Never true  Transportation Needs: No Transportation Needs (01/09/2020)   PRAPARE - Transportation  Lack of Transportation (Medical): No    Lack of Transportation (Non-Medical): No  Physical Activity: Inactive (01/09/2020)   Exercise Vital Sign    Days of Exercise per Week: 0 days    Minutes of Exercise per Session: 0 min  Stress: No Stress Concern Present (01/09/2020)   [employer] of Occupational Health - Occupational Stress Questionnaire    Feeling of Stress : Not at all  Social Connections: Moderately Isolated (01/09/2020)   Social Connection and Isolation Panel [NHANES]    Frequency of Communication with Friends and Family: More than three times a week    Frequency of Social Gatherings with Friends and Family: Once a week    Attends Religious Services: More than 4 times per year    Active Member of [employer] or Organizations: No    Attends Banker Meetings: Never    Marital Status: Never married   Intimate Partner Violence: Not At Risk (01/09/2020)   Humiliation, Afraid, Rape, and Kick questionnaire    Fear of Current or Ex-Partner: No    Emotionally Abused: No    Physically Abused: No    Sexually Abused: No   Family History  Problem Relation Age of Onset   Multiple myeloma Sister    Brain cancer Sister    Dementia Mother        died at 37   Stroke Mother    Heart failure Mother    Diabetes Mother    Heart disease Father    Prostate cancer Brother    Colon cancer Neg Hx       VITAL SIGNS BP 128/60   Pulse 80   Temp (!) 97 F (36.1 C)   Resp 20   Ht 5\' 3"  (1.6 m)   Wt 145 lb 6.4 oz (66 kg)   SpO2 99%   BMI 25.76 kg/m   Outpatient Encounter Medications as of 06/29/2022  Medication Sig   acetaminophen (TYLENOL) 325 MG tablet Take 650 mg by mouth every 8 (eight) hours.   acyclovir (ZOVIRAX) 200 MG capsule Take 200 mg by mouth in the morning. (0800)   Amino Acids-Protein Hydrolys (FEEDING SUPPLEMENT, PRO-STAT SUGAR FREE 64,) LIQD Take 30 mLs by mouth 3 (three) times daily with meals. (0800, 1200 & 1800)   anastrozole (ARIMIDEX) 1 MG tablet TAKE 1 TABLET BY MOUTH DAILY   calcium-vitamin D (OSCAL WITH D) 500-200 MG-UNIT tablet Take 1 tablet by mouth in the morning. (0800)   docusate sodium (COLACE) 100 MG capsule Take 100 mg by mouth 2 (two) times daily.   Insulin Pen Needle (BD AUTOSHIELD DUO) 30G X 5 MM MISC by Does not apply route. 3/16"   melatonin 3 MG TABS tablet Take 6 mg by mouth at bedtime. (2000) For Sleep   multivitamin (RENA-VIT) TABS tablet Take 1 tablet by mouth at bedtime. (2100)   Nutritional Supplements (ENSURE PLUS PO) Take 1 Can by mouth daily. 8pm   ondansetron (ZOFRAN) 4 MG tablet Take 1 tablet (4 mg total) by mouth every 6 (six) hours as needed for nausea.   ondansetron (ZOFRAN-ODT) 4 MG disintegrating tablet Take by mouth daily.   pantoprazole (PROTONIX) 40 MG tablet Take 40 mg by mouth 2 (two) times daily.   sertraline (ZOLOFT) 50 MG tablet  Take 50 mg by mouth daily.   sucralfate (CARAFATE) 1 GM/10ML suspension Take 1 g by mouth in the morning, at noon, in the evening, and at bedtime. (0800, 1200, 1700 & 2100)   UNABLE TO FIND Dysphagia  3 Diet with Thin liquids   Facility-Administered Encounter Medications as of 06/29/2022  Medication   lanreotide acetate (SOMATULINE DEPOT) 120 MG/0.5ML injection   lanreotide acetate (SOMATULINE DEPOT) 120 MG/0.5ML injection   lanreotide acetate (SOMATULINE DEPOT) 120 MG/0.5ML injection   lanreotide acetate (SOMATULINE DEPOT) 120 MG/0.5ML injection   octreotide (SANDOSTATIN LAR) 30 MG IM injection   octreotide (SANDOSTATIN LAR) 30 MG IM injection     SIGNIFICANT DIAGNOSTIC EXAMS  PREVIOUS   10-01-21: chest x-ray: No evidence of active cardiopulmonary process.   10-01-21: abdominal ultrasound:  1. Cholelithiasis with no evidence of acute cholecystitis. 2. Increased echogenicity of the liver parenchyma, findings suggestive of hepatic steatosis.  10-01-21: ct of chest abdomen and pelvis  No aspiration pneumonia. Mild focal scarring at the medial left lung base.   Coronary artery calcification. Aortic atherosclerotic calcification. Aortic Atherosclerosis  Probably acute superior endplate fractures at T12, L1 and L2. No retropulsed bone.   Renal atrophy. Multiple renal cysts. Indeterminate 2.7 cm lesion at the lower pole the right kidney could be a complicated cyst or a mass. This could be evaluated with renal MRI. 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 Nonobstructing 5 mm stone in the lower pole of the left kidney.   1 x 2 cm cystic abnormality in the ventral head of the pancreas. Best practice recommendation is for reimaging of this every 6 months for 2 years and then every 1 year for 2 years and then every 2 years for 6 years. Follow-up can be discontinued if stable for 10 years. This recommendation could be tailored to this patient's clinical status and life expectancy.  Paget's disease  of the right hemipelvis. Diverticulosis without evidence of diverticulitis. Small hiatal hernia. Multiple calcified leiomyomas the uterus.  10-24-21: DEXA: t score -2.957  NO NEW EXAMS    LABS REVIEWED PREVIOUS    06-12-21: wbc 6.5; hgb 8.8; hct 27.7; mcv 101.5 plt 229; glucose 153; bun 40; creat 6.53; k+ 4.3; na++ 136; ca 9.5; GFR 6 protein 6.5; albumin 3.4 mag 2.2  07-07-21: chol 171; ldl 89; trig 265; hdl 29  03-14-08: wbc 15.7; hgb 9.8; hct 32.8; mcv 104.8; plt 226; glucose 164; bun 51; creat 7.74; k+ 4.4; na++ 138; ca 9.7; gfr 5 protein 7.5; albumin 3.9 mag 2.1; chol 171; ldl 89; trig 265; hdl 29; hgb a1c 6.1 08-12-21: wbc 6.3; hgb 10.0; hct 31.9; mcv 103.9 plt 215; glucose 197; bun 40;creat 6.25; k+ 4.2; na++138; ca 9.3; gfr 6; protein 6.7; albumin 3.7; iron 93; tibc 142; ferritin 312 09-30-21: wbc 6.6; hgb 9.6; hct 31.1; mcv 101.3 plt 232; glucose 222; bun 40; creat 6.78; k+ 4.0; na++ 136; ca 9.2; gfr 6; protein 6.8 albumin 3.7  10-01-21: wbc 13.0; hgb 9.1; hct 28.8; mcv 99.3 plt 251; glucose 198; bun 59; creat 9.01; k+ 3.7; na++ 135; ca 9.6; gfr 4; ast 212; alt 173; protein 6.7; albumin 3.6  10-03-21: wbc 7.2; hgb 8.6; hct 27.5; mcv 99.6 plt 179 glucose 105; bun 34; creat 7.04; k+ 3.6; na++ 135; ca 8.8; gfr 5 ast 45; alt 99; protein 6.5; albumin 3.5  10-10-21: hgb a1c 5.3  11-25-21: wbc 4.9; hgb 9.4; hct 30.7; mcv 100.7 plt 237; glucose 171; bun 27; creat 6.88; k+ 4.4; na++ 137; ca 9.0; gfr 6; protein 6.4 albumin 3.5  12-16-21: wbc 5.3; hgb 10.1; hct 33.0 mcv 98.5 plt 256; glucose 119; bun 24; creat 6.62; k+ 4.2 na++ 136; ca 9.3 gfr 6; protein 6.4 albumin 3.5; iron  54; tibc 140; ferritin 272  02-03-22: wbc 3.8; hgb 11.5; hct 37.6; mcv 96.4 plt 212; glucose 164; bun 28; creat 7.99; k+ 4.4; na++ 139; ca 8.3 gfr 5; protein 6.7 albumin 3.7 mag 2.1 02-17-22: wbc 4.6; hgb 11.5; hct 36.5; mcv 93.6 plt 207; glucose 95; bun 23 creat 7.44 k+ 4.1; na++ 135; ca 7.8; gfr 5 protein 6.9; albumin 3.8  03-31-22: wbc  4.7; hgb 10.0; hct 32.2; mcv 96.4 plt 278; glucose 155; bun 28; creat 6.50; k+ 4.0; na++ 132; ca 8.0; gfr 6 protein 6.9 albumin 3.6 05-19-22: wbc 4.1; hgb 9.6; hct 30.9; mcv 98.7 plt 236; glucose 170; bun 33; creat 6.03; k+ 3.9; na++ 137; ca 8.1; gfr 6; protein 6.6 albumin 3.4; mag 2.1   TODAY  06-23-22: wbc 4.9; hgb 9.1; hct 28.7 mcv 101.4 plt 286; glucose 158; bun 25; creat 5.85; k+ 3.9 na++ 136; ca 8.6 gfr 7 protein 6.8 albumin 3.3 mag 2.2    Review of Systems  Constitutional:  Negative for malaise/fatigue.  Respiratory:  Negative for cough and shortness of breath.   Cardiovascular:  Negative for chest pain, palpitations and leg swelling.  Gastrointestinal:  Negative for abdominal pain, constipation and heartburn.  Musculoskeletal:  Negative for back pain, joint pain and myalgias.  Skin: Negative.   Neurological:  Negative for dizziness.  Psychiatric/Behavioral:  The patient is not nervous/anxious.    Physical Exam Constitutional:      General: She is not in acute distress.    Appearance: She is well-developed. She is not diaphoretic.  Neck:     Thyroid: No thyromegaly.  Cardiovascular:     Rate and Rhythm: Normal rate and regular rhythm.     Pulses: Normal pulses.     Heart sounds: Normal heart sounds.  Pulmonary:     Breath sounds: Normal breath sounds.  Abdominal:     General: Bowel sounds are normal. There is no distension.     Palpations: Abdomen is soft.     Tenderness: There is no abdominal tenderness.  Musculoskeletal:        General: Normal range of motion.     Cervical back: Neck supple.     Right lower leg: No edema.     Left lower leg: No edema.  Lymphadenopathy:     Cervical: No cervical adenopathy.  Skin:    General: Skin is warm and dry.     Comments: Chest wall dialysis access   Neurological:     Mental Status: She is alert. Mental status is at baseline.  Psychiatric:        Mood and Affect: Mood normal.         ASSESSMENT/PLAN  TODAY  Herpes:  without outbreak will continue acyclovir 200 mg daily   2. Major depression chronic recurrent: will continue zoloft 50 mg daily has had GDR  3. Protein calorie malnutrition: protein 6.8 albumin 3.3 will continue supplements and prostat with meals.   4. Multiple gastric ulcers/duodenal ulcer/upper GI bleed/esophageal reflux disease without esophagitis: will continue protonix 40 mg twice daily carafate 1 gm with before meals and bedtime. Zofran 4 mg before meals.   PREVIOUS   5. History of right hip fracture: will continue tylenol 650 mg three times daily   6. Chronic kidney disease with end stage renal disease on dialysis dye to type 2 diabetes mellitus:/dependence on dialysis: will continue dialysis three days week;h 1200 cc fluid restriction; calcium acetate 1334 mg with meals  7. History of right hip fracture: will  continue tylenol 650 mg three times daily   8. Chronic kidney disease with end stage renal disease on dialysis dye to type 2 diabetes mellitus:/dependence on dialysis: will continue dialysis three days week;h 1200 cc fluid restriction; calcium acetate 1334 mg with meals  9.Hypertension due to end stage renal disease (ESRD) due to type 2 diabetes mellitus: b/p 128/60 is off tenormin and clonidine  10. Hyperlipidemia associated with type 2 diabetes mellitus: ldl 89; is off lipitor elevated liver enzymes  11. Aortic atherosclerosis / atherosclerotic peripheral vascular disease (ct 10-04-18) ldl 89  12. Vascular dementia with behavioral disturbance: weight is 145 pounds  13. Diabetes mellitus type 2 with end stage renal disease (ESRD) hgb A1c 6.1 is presently off medications.   14. Anemia due to end stage renal disease: hgb 9.1  15. Post menopausal osteoporosis: t score -2.957 will continue supplements  16. Stage 1 infiltrating ductal carcinoma of female right breast/status post right mastectomy will continue arimidex 1 mg daily  17. Multiple myeloma not having achieved  remission: is followed by oncology     Will check hgb A1c       Synthia Innocent NP [facility]  call [phone]

## 2022-06-30 LAB — IMMUNOFIXATION ELECTROPHORESIS
IgA: 307 mg/dL (ref 64–422)
IgG (Immunoglobin G), Serum: 680 mg/dL (ref 586–1602)
IgM (Immunoglobulin M), Srm: 9 mg/dL — ABNORMAL LOW (ref 26–217)
Total Protein ELP: 6.2 g/dL (ref 6.0–8.5)

## 2022-07-01 DIAGNOSIS — N2581 Secondary hyperparathyroidism of renal origin: Secondary | ICD-10-CM | POA: Diagnosis not present

## 2022-07-01 DIAGNOSIS — Z992 Dependence on renal dialysis: Secondary | ICD-10-CM | POA: Diagnosis not present

## 2022-07-01 DIAGNOSIS — N186 End stage renal disease: Secondary | ICD-10-CM | POA: Diagnosis not present

## 2022-07-02 ENCOUNTER — Other Ambulatory Visit (HOSPITAL_COMMUNITY)
Admission: RE | Admit: 2022-07-02 | Discharge: 2022-07-02 | Disposition: A | Discharge status: Home / Self Care | Payer: Medicare PPO | Source: Skilled Nursing Facility | Attending: Adult Health | Admitting: Adult Health

## 2022-07-02 DIAGNOSIS — E1122 Type 2 diabetes mellitus with diabetic chronic kidney disease: Secondary | ICD-10-CM | POA: Diagnosis not present

## 2022-07-02 LAB — HEMOGLOBIN A1C
Hgb A1c MFr Bld: 5.1 % (ref 4.8–5.6)
Mean Plasma Glucose: 99.67 mg/dL

## 2022-07-03 DIAGNOSIS — N186 End stage renal disease: Secondary | ICD-10-CM | POA: Diagnosis not present

## 2022-07-03 DIAGNOSIS — N2581 Secondary hyperparathyroidism of renal origin: Secondary | ICD-10-CM | POA: Diagnosis not present

## 2022-07-03 DIAGNOSIS — Z992 Dependence on renal dialysis: Secondary | ICD-10-CM | POA: Diagnosis not present

## 2022-07-06 ENCOUNTER — Other Ambulatory Visit: Payer: Self-pay

## 2022-07-06 DIAGNOSIS — Z992 Dependence on renal dialysis: Secondary | ICD-10-CM | POA: Diagnosis not present

## 2022-07-06 DIAGNOSIS — N186 End stage renal disease: Secondary | ICD-10-CM | POA: Diagnosis not present

## 2022-07-06 DIAGNOSIS — N2581 Secondary hyperparathyroidism of renal origin: Secondary | ICD-10-CM | POA: Diagnosis not present

## 2022-07-06 DIAGNOSIS — C9000 Multiple myeloma not having achieved remission: Secondary | ICD-10-CM

## 2022-07-06 NOTE — Progress Notes (Signed)
[facility] [address], [place] [address]    Clinic Day:  07/07/2022  Referring physician: Sharee Holster, NP  Patient Care Team: Sharee Holster, NP as PCP - General (Geriatric Medicine) [name], Gerrit Friends, MD as Consulting Physician (Gastroenterology) [name], MD as Medical Oncologist (Medical Oncology) Salomon Mast, MD (Inactive) as Consulting Physician (Nephrology) [facility])   ASSESSMENT & PLAN:   Assessment: 1.  IgA lambda plasma cell myeloma, stage II, standard risk: -BMBX on 09/20/2018 shows plasma cell myeloma, 30% plasma cells.  FISH panel was normal.  Chromosome analysis normal. -Labs at diagnosis on 08/29/2018 with M spike 0.9 g.  Kappa light chains 148, lambda light chains 1132, ratio 0.13.  LDH normal.  Beta-2 microglobulin 13.5.  She had transfusion dependent anemia. -Velcade and dexamethasone started on 10/11/2018. -Myeloma panel on 07/11/2019 shows SPEP is negative.  Free light chain ratio improved to 1.09.  Lambda light chains are 128, improved from 141.  Kappa light chains are 140. -She has been resident at [facility] since 10/12/2019.   2.  Stage I IDC of the right breast, ER/PR positive, HER-2 negative: -She is on anastrozole.   Plan: 1.  IgA lambda plasma cell myeloma: - She is tolerating Velcade every other week very well.  She also receives dexamethasone 10 mg on days of Velcade. - Reviewed labs from 06/23/2022.  M spike is 0.  Lambda light chains have gone up to 528 with ratio of 0.3.  Immunofixation shows IgA lambda. - Reviewed labs from today which showed acceptable CBC and LFTs. - Recommend continuing Velcade every other week at this time.  If the light chains continue to go up, consider changing Velcade to 3 weeks on/1 week off. - She has developed slight numbness in the fingertips mostly at nighttime without any pain.  Will closely monitor.   2.  ESRD on HD: - Continue HD on  Monday, Wednesday and Friday.   3.  Bone strengthening: - Bisphosphonates were not started due to patient being on dialysis.   4.  Osteopenia: - Last bone density was in 2017.  Will consider repeating during this year.   5.  Right breast cancer: - Continue anastrozole.  She is tolerating well.  Orders Placed This Encounter  Procedures   Magnesium    Standing Status:   Future    Standing Expiration Date:   08/04/2023   CBC with Differential    Standing Status:   Future    Standing Expiration Date:   08/04/2023   Comprehensive metabolic panel    Standing Status:   Future    Standing Expiration Date:   08/04/2023   Magnesium    Standing Status:   Future    Standing Expiration Date:   08/18/2023   CBC with Differential    Standing Status:   Future    Standing Expiration Date:   08/18/2023   Comprehensive metabolic panel    Standing Status:   Future    Standing Expiration Date:   08/18/2023   Magnesium    Standing Status:   Future    Standing Expiration Date:   09/01/2023   CBC with Differential    Standing Status:   Future    Standing Expiration Date:   09/01/2023   Comprehensive metabolic panel    Standing Status:   Future    Standing Expiration Date:   09/01/2023   Magnesium    Standing Status:  Future    Standing Expiration Date:   09/15/2023   CBC with Differential    Standing Status:   Future    Standing Expiration Date:   09/15/2023   Comprehensive metabolic panel    Standing Status:   Future    Standing Expiration Date:   09/15/2023   Magnesium    Standing Status:   Future    Standing Expiration Date:   09/29/2023   CBC with Differential    Standing Status:   Future    Standing Expiration Date:   09/29/2023   Comprehensive metabolic panel    Standing Status:   Future    Standing Expiration Date:   09/29/2023   Magnesium    Standing Status:   Future    Standing Expiration Date:   10/13/2023   CBC with Differential    Standing Status:   Future    Standing Expiration Date:    10/13/2023   Comprehensive metabolic panel    Standing Status:   Future    Standing Expiration Date:   10/13/2023   Magnesium    Standing Status:   Future    Standing Expiration Date:   10/27/2023   CBC with Differential    Standing Status:   Future    Standing Expiration Date:   10/27/2023   Comprehensive metabolic panel    Standing Status:   Future    Standing Expiration Date:   10/27/2023   Kappa/lambda light chains    Standing Status:   Future    Standing Expiration Date:   07/07/2023   Immunofixation electrophoresis    Standing Status:   Future    Standing Expiration Date:   07/07/2023   Protein electrophoresis, serum    Standing Status:   Future    Standing Expiration Date:   07/07/2023      I,[name],acting as a scribe for [name], MD.,have documented all relevant documentation on the behalf of [name], MD,as directed by  [name], MD while in the presence of [name], MD.   I, [name] MD, have reviewed the above documentation for accuracy and completeness, and I agree with the above.   [name], MD   4/30/20245:05 PM  CHIEF COMPLAINT:   Diagnosis: plasma cell myeloma and right breast cancer    Cancer Staging  Stage 1 infiltrating ductal carcinoma of right female breast [facility]) Staging form: Breast, AJCC 7th Edition - Clinical stage from 08/30/2015: Stage IA (T1c, N0, M0) - Signed by Ellouise Newer, PA-C on 08/30/2015    Prior Therapy: none  Current Therapy:  maintenance Velcade & Decadron 3/4 weeks; anastrozole   HISTORY OF PRESENT ILLNESS:   Oncology History  Stage 1 infiltrating ductal carcinoma of right female breast (HCC)  09/12/2014 Mammogram   Mass in upper outer R breast, middle third depth appears slightly larger than it was on prior exam with more irreg spiculated margins   10/02/2014 Pathology Results   biopsy with invasive ductal carcinoma high grade 1.1 cm, dcis solid type.  additional R breast tissue, excision, invasive ductal high grade 0.9 cm    10/24/2014 Pathology Results   no residual invasive carcinoma, DCIS, focal, atypical ductal hyperplasia, 0/7 LN positive for metastatic carcinoma ER > 90%, PR 30%, HER 2 2+   10/24/2014 Cancer Staging   [id number]   10/24/2014 Surgery   R mastectomy, T1c, N0   12/28/2014 - 11/22/2015 Chemotherapy   Taxol/herceptin weekly X 12, Herceptin every 21 days, last due in  September 2017   03/11/2015 -  Anti-estrogen oral therapy   Arimidex 1 mg daily   09/12/2015 Imaging   MUGA- The left ventricular ejection fraction equals 68%.   06/08/2016 Treatment Plan Change   Started Nerlynx   Multiple myeloma (HCC)  10/04/2018 Initial Diagnosis   Multiple myeloma (HCC)   10/11/2018 - 10/28/2021 Chemotherapy   Patient is on Treatment Plan : MYELOMA NON-TRANSPLANT CANDIDATES VRd weekly d21d      11/25/2021 -  Chemotherapy   Patient is on Treatment Plan : MYELOMA MAINTENANCE Bortezomib SQ q14d        INTERVAL HISTORY:   Carly Wood is a 87 y.o. female presenting to clinic today for follow up of plasma cell myeloma and breast cancer. She was last seen by me on 03/31/22.  Today, she states that she is doing well overall. Her appetite level is at 100%. Her energy level is at 25%.  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   Dysphagia    Dyspnea    with activity   GERD (gastroesophageal reflux disease)    Glaucoma    Hyperlipidemia    Hypertension    Multiple myeloma (HCC)    Pneumonia    Stage 1  infiltrating ductal carcinoma of right female breast (HCC) 08/21/2015   ER+ PR+ HER 2 neu + (3+) T1cN0     Surgical History: Past Surgical History:  Procedure Laterality Date   A/V FISTULAGRAM Right 07/05/2020   Procedure: A/V Fistulagram;  Surgeon: Sherren Kerns, MD;  [facility];  Service: Cardiovascular;  Laterality: Right;   AV FISTULA PLACEMENT Left 11/22/2017   Procedure: ARTERIOVENOUS (AV) FISTULA CREATION LEFT ARM;  Surgeon: Sherren Kerns, MD;  Location[facility] OR;  Service: Vascular;  Laterality: Left;   AV FISTULA PLACEMENT Right 04/04/2020   Procedure: RIGHT ARM ARTERIOVENOUS FISTULA CREATION;  Surgeon: Larina Earthly, MD;  Location: [facility] ORS;  Service: Vascular;  Laterality: Right;   AV FISTULA PLACEMENT Right 08/20/2020   Procedure: ARTERIOVENOUS (AV) FISTULA LIGATION RIGHT ARM;  Surgeon: Sherren Kerns, MD;  Location: [facility] OR;  Service: Vascular;  Laterality: Right;   BIOPSY  08/07/2016   Procedure: BIOPSY;  Surgeon: Corbin Ade, MD;  [facility]: [facility];  Service: Endoscopy;;  gastric ulcer biopsy   COLONOSCOPY     ESOPHAGOGASTRODUODENOSCOPY N/A 08/07/2016   LA Grade A esophagitis s/p dilation, small hiatal hernia, multiple gastric ulcers and erosions, duodenal erosions s/p biopsy. Negative H.pylori    ESOPHAGOGASTRODUODENOSCOPY N/A 11/27/2016   normal esophagus, previously noted gastric ulcers completely healed, normal duodenum.    ESOPHAGOGASTRODUODENOSCOPY (EGD) WITH PROPOFOL N/A 07/23/2020   Procedure: ESOPHAGOGASTRODUODENOSCOPY (EGD) WITH PROPOFOL;  Surgeon: Malissa Hippo, MD;  [facility];  Service: Endoscopy;  Laterality: N/A;   FISTULA SUPERFICIALIZATION Left 02/14/2018   Procedure: FISTULA SUPERFICIALIZATION LEFT ARM;  Surgeon: Chuck Hint, MD;  Location: [facility] OR;  Service: Vascular;  Laterality: Left;   FRACTURE SURGERY Right  ankle   HOT HEMOSTASIS  07/23/2020   Procedure: HOT HEMOSTASIS (ARGON PLASMA  COAGULATION/BICAP);  Surgeon: Malissa Hippo, MD;  [facility];  Service: Endoscopy;;   INTRAMEDULLARY (IM) NAIL INTERTROCHANTERIC Right 07/12/2020   Procedure: INTRAMEDULLARY (IM) NAIL INTERTROCHANTRIC;  Surgeon: Oliver Barre, MD;  Location: [facility];  Service: Orthopedics;  Laterality: Right;   MALONEY DILATION N/A 08/07/2016   Procedure: Elease Hashimoto DILATION;  Surgeon: Corbin Ade, MD;  [facility];  Service: Endoscopy;  Laterality: N/A;   MASTECTOMY, PARTIAL Right    PERIPHERAL VASCULAR BALLOON ANGIOPLASTY Left 07/13/2019   Procedure: PERIPHERAL VASCULAR BALLOON ANGIOPLASTY;  Surgeon: Cephus Shelling, MD;  [facility];  Service: Cardiovascular;  Laterality: Left;  arm fistulogram   PERIPHERAL VASCULAR BALLOON ANGIOPLASTY Right 05/22/2020   Procedure: PERIPHERAL VASCULAR BALLOON ANGIOPLASTY;  Surgeon: Leonie Douglas, MD;  [facility];  Service: Cardiovascular;  Laterality: Right;  arm fistula   PERIPHERAL VASCULAR BALLOON ANGIOPLASTY Right 07/05/2020   Procedure: PERIPHERAL VASCULAR BALLOON ANGIOPLASTY;  Surgeon: Sherren Kerns, MD;  [facility];  Service: Cardiovascular;  Laterality: Right;  arm fistula   PORT-A-CATH REMOVAL Left 11/22/2017   Procedure: REMOVAL PORT-A-CATH LEFT CHEST;  Surgeon: Sherren Kerns, MD;  Location[facility] OR;  Service: Vascular;  Laterality: Left;   RETINAL DETACHMENT SURGERY Right    SCLEROTHERAPY  07/23/2020   Procedure: SCLEROTHERAPY;  Surgeon: Malissa Hippo, MD;  [facility];  Service: Endoscopy;;   UPPER EXTREMITY VENOGRAPHY N/A 08/22/2020   Procedure: LEFT UPPER & CENTRAL VENOGRAPHY;  Surgeon: Cephus Shelling, MD;  [facility];  Service: Cardiovascular;  Laterality: N/A;    Social History: Social History   Socioeconomic History   Marital status: Single    Spouse name: Not on file   Number of children: Not on file   Years of  education: Not on file   Highest education level: Not on file  Occupational History   Occupation: retired   Tobacco Use   Smoking status: Never   Smokeless tobacco: Never  Vaping Use   Vaping Use: Never used  Substance and Sexual Activity   Alcohol use: No    Alcohol/week: 0.0 standard drinks of alcohol   Drug use: No   Sexual activity: Never  Other Topics Concern   Not on file  Social History Narrative   Long term resident of SNF    Social Determinants of Health   Financial Resource Strain: Low Risk  (01/09/2020)   Overall Financial Resource Strain (CARDIA)    Difficulty of Paying Living Expenses: Not very hard  Food Insecurity: No Food Insecurity (01/09/2020)   Hunger Vital Sign    Worried About Running Out of Food in the Last Year: Never true    Ran Out of Food in the Last Year: Never true  Transportation Needs: No Transportation Needs (01/09/2020)   PRAPARE - Administrator, Civil Service (Medical): No    Lack of Transportation (Non-Medical): No  Physical Activity: Inactive (01/09/2020)   Exercise Vital Sign    Days of Exercise per Week: 0 days    Minutes of Exercise per Session: 0 min  Stress: No Stress Concern Present (01/09/2020)   [employer] of Occupational Health - Occupational Stress Questionnaire    Feeling of Stress : Not at all  Social Connections: Moderately Isolated (01/09/2020)   Social Connection and Isolation Panel [NHANES]  Frequency of Communication with Friends and Family: More than three times a week    Frequency of Social Gatherings with Friends and Family: Once a week    Attends Religious Services: More than 4 times per year    Active Member of [employer] or Organizations: No    Attends Banker Meetings: Never    Marital Status: Never married  Intimate Partner Violence: Not At Risk (01/09/2020)   Humiliation, Afraid, Rape, and Kick questionnaire    Fear of Current or Ex-Partner: No    Emotionally Abused: No    Physically  Abused: No    Sexually Abused: No    Family History: Family History  Problem Relation Age of Onset   Multiple myeloma Sister    Brain cancer Sister    Dementia Mother        died at 80   Stroke Mother    Heart failure Mother    Diabetes Mother    Heart disease Father    Prostate cancer Brother    Colon cancer Neg Hx     Current Medications:  Current Outpatient Medications:    acetaminophen (TYLENOL) 325 MG tablet, Take 650 mg by mouth every 8 (eight) hours., Disp: , Rfl:    acyclovir (ZOVIRAX) 200 MG capsule, Take 200 mg by mouth in the morning. (0800), Disp: , Rfl:    Amino Acids-Protein Hydrolys (FEEDING SUPPLEMENT, PRO-STAT SUGAR FREE 64,) LIQD, Take 30 mLs by mouth 3 (three) times daily with meals. (0800, 1200 & 1800), Disp: , Rfl:    anastrozole (ARIMIDEX) 1 MG tablet, TAKE 1 TABLET BY MOUTH DAILY, Disp: 30 tablet, Rfl: 6   calcium-vitamin D (OSCAL WITH D) 500-200 MG-UNIT tablet, Take 1 tablet by mouth in the morning. (0800), Disp: , Rfl:    docusate sodium (COLACE) 100 MG capsule, Take 100 mg by mouth 2 (two) times daily., Disp: , Rfl:    Insulin Pen Needle (BD AUTOSHIELD DUO) 30G X 5 MM MISC, by Does not apply route. 3/16", Disp: , Rfl:    melatonin 3 MG TABS tablet, Take 6 mg by mouth at bedtime. (2000) For Sleep, Disp: , Rfl:    multivitamin (RENA-VIT) TABS tablet, Take 1 tablet by mouth at bedtime. (2100), Disp: , Rfl:    Nutritional Supplements (ENSURE PLUS PO), Take 1 Can by mouth daily. 8pm, Disp: , Rfl:    ondansetron (ZOFRAN) 4 MG tablet, Take 1 tablet (4 mg total) by mouth every 6 (six) hours as needed for nausea., Disp: 20 tablet, Rfl: 0   ondansetron (ZOFRAN-ODT) 4 MG disintegrating tablet, Take by mouth daily., Disp: , Rfl:    pantoprazole (PROTONIX) 40 MG tablet, Take 40 mg by mouth 2 (two) times daily., Disp: , Rfl:    sertraline (ZOLOFT) 50 MG tablet, Take 50 mg by mouth daily., Disp: , Rfl:    sucralfate (CARAFATE) 1 GM/10ML suspension, Take 1 g by mouth in  the morning, at noon, in the evening, and at bedtime. (0800, 1200, 1700 & 2100), Disp: , Rfl:    UNABLE TO FIND, Dysphagia 3 Diet with Thin liquids, Disp: , Rfl:  No current facility-administered medications for this visit.  Facility-Administered Medications Ordered in Other Visits:    lanreotide acetate (SOMATULINE DEPOT) 120 MG/0.5ML injection, , , ,    lanreotide acetate (SOMATULINE DEPOT) 120 MG/0.5ML injection, , , ,    lanreotide acetate (SOMATULINE DEPOT) 120 MG/0.5ML injection, , , ,    lanreotide acetate (SOMATULINE DEPOT) 120 MG/0.5ML injection, , , ,  octreotide (SANDOSTATIN LAR) 30 MG IM injection, , , ,    octreotide (SANDOSTATIN LAR) 30 MG IM injection, , , ,    Allergies: Allergies  Allergen Reactions   Ace Inhibitors Other (See Comments) and Cough    Tongue swell , ie angioedema   Angiotensin Receptor Blockers     Angioedema with ACE-I   Penicillins Other (See Comments)    Unsure of reaction Has patient had a PCN reaction causing immediate rash, facial/tongue/throat swelling, SOB or lightheadedness with hypotension: Unknown Has patient had a PCN reaction causing severe rash involving mucus membranes or skin necrosis: Unknown Has patient had a PCN reaction that required hospitalization: No Has patient had a PCN reaction occurring within the last 10 years: Unknown If all of the above answers are "NO", then may proceed with Cephalosporin use.     Penicillin G     REVIEW OF SYSTEMS:   Review of Systems  Constitutional:  Negative for chills, fatigue and fever.  HENT:   Negative for lump/mass, mouth sores, nosebleeds, sore throat and trouble swallowing.   Eyes:  Negative for eye problems.  Respiratory:  Negative for cough and shortness of breath.   Cardiovascular:  Positive for leg swelling. Negative for chest pain and palpitations.  Gastrointestinal:  Negative for abdominal pain, constipation, diarrhea, nausea and vomiting.  Genitourinary:  Negative for bladder  incontinence, difficulty urinating, dysuria, frequency, hematuria and nocturia.   Musculoskeletal:  Negative for arthralgias, back pain, flank pain, myalgias and neck pain.  Skin:  Negative for itching and rash.  Neurological:  Positive for numbness. Negative for dizziness and headaches.  Hematological:  Does not bruise/bleed easily.  Psychiatric/Behavioral:  Negative for depression, sleep disturbance and suicidal ideas. The patient is not nervous/anxious.   All other systems reviewed and are negative.    VITALS:   Blood pressure (!) 143/73, pulse 93, temperature 99.1 F (37.3 C), temperature source Oral, resp. rate 18, SpO2 100 %.  Wt Readings from Last 3 Encounters:  07/07/22 141 lb (64 kg)  06/29/22 145 lb 6.4 oz (66 kg)  06/23/22 145 lb 6.4 oz (66 kg)    There is no height or weight on file to calculate BMI.  Performance status (ECOG): 1 - Symptomatic but completely ambulatory  PHYSICAL EXAM:   Physical Exam Vitals and nursing note reviewed. Exam conducted with a chaperone present.  Constitutional:      Appearance: Normal appearance.  Cardiovascular:     Rate and Rhythm: Normal rate and regular rhythm.     Pulses: Normal pulses.     Heart sounds: Normal heart sounds.  Pulmonary:     Effort: Pulmonary effort is normal.     Breath sounds: Normal breath sounds.  Abdominal:     Palpations: Abdomen is soft. There is no hepatomegaly, splenomegaly or mass.     Tenderness: There is no abdominal tenderness.  Musculoskeletal:     Right lower leg: No edema.     Left lower leg: No edema.  Lymphadenopathy:     Cervical: No cervical adenopathy.     Right cervical: No superficial, deep or posterior cervical adenopathy.    Left cervical: No superficial, deep or posterior cervical adenopathy.     Upper Body:     Right upper body: No supraclavicular or axillary adenopathy.     Left upper body: No supraclavicular or axillary adenopathy.  Neurological:     General: No focal deficit  present.     Mental Status: She is alert and  oriented to person, place, and time.  Psychiatric:        Mood and Affect: Mood normal.        Behavior: Behavior normal.     LABS:      Latest Ref Rng & Units 07/07/2022   12:42 PM 06/23/2022   12:25 PM 06/02/2022   12:49 PM  CBC  WBC 4.0 - 10.5 K/uL 4.9  4.9  4.5   Hemoglobin 12.0 - 15.0 g/dL 16.1  9.1  9.3   Hematocrit 36.0 - 46.0 % 32.8  28.7  29.2   Platelets 150 - 400 K/uL 265  286  228       Latest Ref Rng & Units 07/07/2022   12:42 PM 06/23/2022   12:25 PM 06/02/2022   12:49 PM  CMP  Glucose 70 - 99 mg/dL 096  045  409   BUN 8 - 23 mg/dL 32  25  32   Creatinine 0.44 - 1.00 mg/dL 8.11  9.14  7.82   Sodium 135 - 145 mmol/L 136  136  135   Potassium 3.5 - 5.1 mmol/L 4.3  3.9  4.1   Chloride 98 - 111 mmol/L 99  98  95   CO2 22 - 32 mmol/L 26  27  27    Calcium 8.9 - 10.3 mg/dL 8.9  8.6  8.5   Total Protein 6.5 - 8.1 g/dL 7.2  6.8  6.4   Total Bilirubin 0.3 - 1.2 mg/dL 0.3  <9.5  0.5   Alkaline Phos 38 - 126 U/L 57  54  52   AST 15 - 41 U/L 18  19  16    ALT 0 - 44 U/L 11  10  10       No results found for: "CEA1", "CEA" / No results found for: "CEA1", "CEA" No results found for: "PSA1" No results found for: "CAN199" No results found for: "CAN125"  Lab Results  Component Value Date   TOTALPROTELP 6.2 06/23/2022   TOTALPROTELP 6.1 06/23/2022   ALBUMINELP 3.5 06/23/2022   A1GS 0.3 06/23/2022   A2GS 0.8 06/23/2022   BETS 0.9 06/23/2022   GAMS 0.6 06/23/2022   MSPIKE Not Observed 06/23/2022   SPEI Comment 06/23/2022   Lab Results  Component Value Date   TIBC 140 (L) 12/16/2021   TIBC 142 (L) 08/12/2021   TIBC 184 (L) 04/18/2019   FERRITIN 272 12/16/2021   FERRITIN 312 (H) 08/12/2021   FERRITIN 72 04/18/2019   IRONPCTSAT 39 (H) 12/16/2021   IRONPCTSAT 65 (H) 08/12/2021   IRONPCTSAT 26 04/18/2019   Lab Results  Component Value Date   LDH 120 10/21/2021   LDH 129 10/14/2021   LDH 136 10/07/2020     STUDIES:    No results found.

## 2022-07-07 ENCOUNTER — Inpatient Hospital Stay: Payer: Medicare PPO

## 2022-07-07 ENCOUNTER — Inpatient Hospital Stay (HOSPITAL_BASED_OUTPATIENT_CLINIC_OR_DEPARTMENT_OTHER): Payer: Medicare PPO | Admitting: Hematology

## 2022-07-07 VITALS — BP 143/73 | HR 93 | Temp 99.1°F | Resp 18

## 2022-07-07 VITALS — Wt 141.0 lb

## 2022-07-07 DIAGNOSIS — C9000 Multiple myeloma not having achieved remission: Secondary | ICD-10-CM | POA: Diagnosis not present

## 2022-07-07 DIAGNOSIS — M8580 Other specified disorders of bone density and structure, unspecified site: Secondary | ICD-10-CM | POA: Diagnosis not present

## 2022-07-07 DIAGNOSIS — Z9011 Acquired absence of right breast and nipple: Secondary | ICD-10-CM | POA: Diagnosis not present

## 2022-07-07 DIAGNOSIS — Z79811 Long term (current) use of aromatase inhibitors: Secondary | ICD-10-CM | POA: Diagnosis not present

## 2022-07-07 DIAGNOSIS — Z992 Dependence on renal dialysis: Secondary | ICD-10-CM | POA: Diagnosis not present

## 2022-07-07 DIAGNOSIS — Z5112 Encounter for antineoplastic immunotherapy: Secondary | ICD-10-CM | POA: Diagnosis not present

## 2022-07-07 DIAGNOSIS — N186 End stage renal disease: Secondary | ICD-10-CM | POA: Diagnosis not present

## 2022-07-07 DIAGNOSIS — Z170 Estrogen receptor positive status [ER+]: Secondary | ICD-10-CM | POA: Diagnosis not present

## 2022-07-07 DIAGNOSIS — C50911 Malignant neoplasm of unspecified site of right female breast: Secondary | ICD-10-CM | POA: Diagnosis not present

## 2022-07-07 LAB — COMPREHENSIVE METABOLIC PANEL
ALT: 11 U/L (ref 0–44)
AST: 18 U/L (ref 15–41)
Albumin: 3.7 g/dL (ref 3.5–5.0)
Alkaline Phosphatase: 57 U/L (ref 38–126)
Anion gap: 11 (ref 5–15)
BUN: 32 mg/dL — ABNORMAL HIGH (ref 8–23)
CO2: 26 mmol/L (ref 22–32)
Calcium: 8.9 mg/dL (ref 8.9–10.3)
Chloride: 99 mmol/L (ref 98–111)
Creatinine, Ser: 6.15 mg/dL — ABNORMAL HIGH (ref 0.44–1.00)
GFR, Estimated: 6 mL/min — ABNORMAL LOW (ref >60)
Glucose, Bld: 141 mg/dL — ABNORMAL HIGH (ref 70–99)
Potassium: 4.3 mmol/L (ref 3.5–5.1)
Sodium: 136 mmol/L (ref 135–145)
Total Bilirubin: 0.3 mg/dL (ref 0.3–1.2)
Total Protein: 7.2 g/dL (ref 6.5–8.1)

## 2022-07-07 LAB — CBC WITH DIFFERENTIAL/PLATELET
Abs Immature Granulocytes: 0.02 10*3/uL (ref 0.00–0.07)
Basophils Absolute: 0 10*3/uL (ref 0.0–0.1)
Basophils Relative: 0 %
Eosinophils Absolute: 0.2 10*3/uL (ref 0.0–0.5)
Eosinophils Relative: 4 %
HCT: 32.8 % — ABNORMAL LOW (ref 36.0–46.0)
Hemoglobin: 10.3 g/dL — ABNORMAL LOW (ref 12.0–15.0)
Immature Granulocytes: 0 %
Lymphocytes Relative: 20 %
Lymphs Abs: 1 10*3/uL (ref 0.7–4.0)
MCH: 32.2 pg (ref 26.0–34.0)
MCHC: 31.4 g/dL (ref 30.0–36.0)
MCV: 102.5 fL — ABNORMAL HIGH (ref 80.0–100.0)
Monocytes Absolute: 0.5 10*3/uL (ref 0.1–1.0)
Monocytes Relative: 10 %
Neutro Abs: 3.2 10*3/uL (ref 1.7–7.7)
Neutrophils Relative %: 66 %
Platelets: 265 10*3/uL (ref 150–400)
RBC: 3.2 MIL/uL — ABNORMAL LOW (ref 3.87–5.11)
RDW: 17.3 % — ABNORMAL HIGH (ref 11.5–15.5)
WBC: 4.9 10*3/uL (ref 4.0–10.5)
nRBC: 0 % (ref 0.0–0.2)

## 2022-07-07 LAB — MAGNESIUM: Magnesium: 2.3 mg/dL (ref 1.7–2.4)

## 2022-07-07 MED ORDER — PROCHLORPERAZINE MALEATE 10 MG PO TABS
10.0000 mg | ORAL_TABLET | Freq: Once | ORAL | Status: AC
  Administered 2022-07-07: 10 mg via ORAL
  Filled 2022-07-07: qty 1

## 2022-07-07 MED ORDER — BORTEZOMIB CHEMO SQ INJECTION 3.5 MG (2.5MG/ML)
1.2000 mg/m2 | Freq: Once | INTRAMUSCULAR | Status: AC
  Administered 2022-07-07: 2 mg via SUBCUTANEOUS
  Filled 2022-07-07: qty 0.8

## 2022-07-07 NOTE — Patient Instructions (Signed)
[facility] at [facility] Discharge Instructions   You were seen and examined today by Dr. Katragadda.  He reviewed the results of your lab work which are normal/stable.   We will proceed with your treatment today.  Return as scheduled.    Thank you for choosing [facility] at [facility] to provide your oncology and hematology care.  To afford each patient quality time with our provider, please arrive at least 15 minutes before your scheduled appointment time.   If you have a lab appointment with the Cancer Center please come in thru the Main Entrance and check in at the main information desk.  You need to re-schedule your appointment should you arrive 10 or more minutes late.  We strive to give you quality time with our providers, and arriving late affects you and other patients whose appointments are after yours.  Also, if you no show three or more times for appointments you may be dismissed from the clinic at the providers discretion.     Again, thank you for choosing [facility].  Our hope is that these requests will decrease the amount of time that you wait before being seen by our physicians.       _____________________________________________________________  Should you have questions after your visit to  Cancer Center, please contact our office at [phone] and follow the prompts.  Our office hours are 8:00 a.m. and 4:30 p.m. Monday - Friday.  Please note that voicemails left after 4:00 p.m. may not be returned until the following business day.  We are closed weekends and major holidays.  You do have access to a nurse 24-7, just call the main number to the clinic [phone] and do not press any options, hold on the line and a nurse will answer the phone.    For prescription refill requests, have your pharmacy contact our office and allow 72 hours.    Due to Covid, you will need to wear a mask upon entering  the hospital. If you do not have a mask, a mask will be given to you at the Main Entrance upon arrival. For doctor visits, patients may have 1 support person age 18 or older with them. For treatment visits, patients can not have anyone with them due to social distancing guidelines and our immunocompromised population.      

## 2022-07-07 NOTE — Patient Instructions (Signed)
[facility]  Discharge Instructions: Thank you for choosing [facility] to provide your oncology and hematology care.  If you have a lab appointment with the Cancer Center - please note that after April 8th, 2024, all labs will be drawn in the cancer center.  You do not have to check in or register with the main entrance as you have in the past but will complete your check-in in the cancer center.  Wear comfortable clothing and clothing appropriate for easy access to any Portacath or PICC line.   We strive to give you quality time with your provider. You may need to reschedule your appointment if you arrive late (15 or more minutes).  Arriving late affects you and other patients whose appointments are after yours.  Also, if you miss three or more appointments without notifying the office, you may be dismissed from the clinic at the provider's discretion.      For prescription refill requests, have your pharmacy contact our office and allow 72 hours for refills to be completed.    Today you received the following chemotherapy and/or immunotherapy agents Velcade. Bortezomib Injection What is this medication? BORTEZOMIB (bor TEZ oh mib) treats lymphoma. It may also be used to treat multiple myeloma, a type of bone marrow cancer. It works by blocking a protein that causes cancer cells to grow and multiply. This helps to slow or stop the spread of cancer cells. This medicine may be used for other purposes; ask your health care provider or pharmacist if you have questions. COMMON BRAND NAME(S): Velcade What should I tell my care team before I take this medication? They need to know if you have any of these conditions: Dehydration Diabetes Heart disease Liver disease Tingling of the fingers or toes or other nerve disorder An unusual or allergic reaction to bortezomib, other medications, foods, dyes, or preservatives If you or your partner are pregnant or trying to  get pregnant Breastfeeding How should I use this medication? This medication is injected into a vein or under the skin. It is given by your care team in a hospital or clinic setting. Talk to your care team about the use of this medication in children. Special care may be needed. Overdosage: If you think you have taken too much of this medicine contact a poison control center or emergency room at once. NOTE: This medicine is only for you. Do not share this medicine with others. What if I miss a dose? Keep appointments for follow-up doses. It is important not to miss your dose. Call your care team if you are unable to keep an appointment. What may interact with this medication? Ketoconazole Rifampin This list may not describe all possible interactions. Give your health care provider a list of all the medicines, herbs, non-prescription drugs, or dietary supplements you use. Also tell them if you smoke, drink alcohol, or use illegal drugs. Some items may interact with your medicine. What should I watch for while using this medication? Your condition will be monitored carefully while you are receiving this medication. You may need blood work while taking this medication. This medication may affect your coordination, reaction time, or judgment. Do not drive or operate machinery until you know how this medication affects you. Sit up or stand slowly to reduce the risk of dizzy or fainting spells. Drinking alcohol with this medication can increase the risk of these side effects. This medication may increase your risk of getting an infection. Call   your care team for advice if you get a fever, chills, sore throat, or other symptoms of a cold or flu. Do not treat yourself. Try to avoid being around people who are sick. Check with your care team if you have severe diarrhea, nausea, and vomiting, or if you sweat a lot. The loss of too much body fluid may make it dangerous for you to take this medication. Talk to  your care team if you may be pregnant. Serious birth defects can occur if you take this medication during pregnancy and for 7 months after the last dose. You will need a negative pregnancy test before starting this medication. Contraception is recommended while taking this medication and for 7 months after the last dose. Your care team can help you find the option that works for you. If your partner can get pregnant, use a condom during sex while taking this medication and for 4 months after the last dose. Do not breastfeed while taking this medication and for 2 months after the last dose. This medication may cause infertility. Talk to your care team if you are concerned about your fertility. What side effects may I notice from receiving this medication? Side effects that you should report to your care team as soon as possible: Allergic reactions--skin rash, itching, hives, swelling of the face, lips, tongue, or throat Bleeding--bloody or black, tar-like stools, vomiting blood or Curley Hogen material that looks like coffee grounds, red or dark Micharl Helmes urine, small red or purple spots on skin, unusual bruising or bleeding Bleeding in the brain--severe headache, stiff neck, confusion, dizziness, change in vision, numbness or weakness of the face, arm, or leg, trouble speaking, trouble walking, vomiting Bowel blockage--stomach cramping, unable to have a bowel movement or pass gas, loss of appetite, vomiting Heart failure--shortness of breath, swelling of the ankles, feet, or hands, sudden weight gain, unusual weakness or fatigue Infection--fever, chills, cough, sore throat, wounds that don't heal, pain or trouble when passing urine, general feeling of discomfort or being unwell Liver injury--right upper belly pain, loss of appetite, nausea, light-colored stool, dark yellow or Kendyll Huettner urine, yellowing skin or eyes, unusual weakness or fatigue Low blood pressure--dizziness, feeling faint or lightheaded, blurry  vision Lung injury--shortness of breath or trouble breathing, cough, spitting up blood, chest pain, fever Pain, tingling, or numbness in the hands or feet Severe or prolonged diarrhea Stomach pain, bloody diarrhea, pale skin, unusual weakness or fatigue, decrease in the amount of urine, which may be signs of hemolytic uremic syndrome Sudden and severe headache, confusion, change in vision, seizures, which may be signs of posterior reversible encephalopathy syndrome (PRES) TTP--purple spots on the skin or inside the mouth, pale skin, yellowing skin or eyes, unusual weakness or fatigue, fever, fast or irregular heartbeat, confusion, change in vision, trouble speaking, trouble walking Tumor lysis syndrome (TLS)--nausea, vomiting, diarrhea, decrease in the amount of urine, dark urine, unusual weakness or fatigue, confusion, muscle pain or cramps, fast or irregular heartbeat, joint pain Side effects that usually do not require medical attention (report to your care team if they continue or are bothersome): Constipation Diarrhea Fatigue Loss of appetite Nausea This list may not describe all possible side effects. Call your doctor for medical advice about side effects. You may report side effects to FDA at 1-800-FDA-1088. Where should I keep my medication? This medication is given in a hospital or clinic. It will not be stored at home. NOTE: This sheet is a summary. It may not cover all possible information. If   you have questions about this medicine, talk to your doctor, pharmacist, or health care provider.  2023 Elsevier/Gold Standard (2021-07-23 00:00:00)       To help prevent nausea and vomiting after your treatment, we encourage you to take your nausea medication as directed.  BELOW ARE SYMPTOMS THAT SHOULD BE REPORTED IMMEDIATELY: *FEVER GREATER THAN 100.4 F (38 C) OR HIGHER *CHILLS OR SWEATING *NAUSEA AND VOMITING THAT IS NOT CONTROLLED WITH YOUR NAUSEA MEDICATION *UNUSUAL SHORTNESS OF  BREATH *UNUSUAL BRUISING OR BLEEDING *URINARY PROBLEMS (pain or burning when urinating, or frequent urination) *BOWEL PROBLEMS (unusual diarrhea, constipation, pain near the anus) TENDERNESS IN MOUTH AND THROAT WITH OR WITHOUT PRESENCE OF ULCERS (sore throat, sores in mouth, or a toothache) UNUSUAL RASH, SWELLING OR PAIN  UNUSUAL VAGINAL DISCHARGE OR ITCHING   Items with * indicate a potential emergency and should be followed up as soon as possible or go to the Emergency Department if any problems should occur.  Please show the CHEMOTHERAPY ALERT CARD or IMMUNOTHERAPY ALERT CARD at check-in to the Emergency Department and triage nurse.  Should you have questions after your visit or need to cancel or reschedule your appointment, please contact [facility] [phone]  and follow the prompts.  Office hours are 8:00 a.m. to 4:30 p.m. Monday - Friday. Please note that voicemails left after 4:00 p.m. may not be returned until the following business day.  We are closed weekends and major holidays. You have access to a nurse at all times for urgent questions. Please call the main number to the clinic [phone] and follow the prompts.  For any non-urgent questions, you may also contact your provider using MyChart. We now offer e-Visits for anyone 18 and older to request care online for non-urgent symptoms. For details visit [email or web address].   Also download the MyChart app! Go to the app store, search "MyChart", open the app, select [place], and log in with your MyChart username and password.   

## 2022-07-07 NOTE — Progress Notes (Signed)
Ok to proceed before CMET is reported out.  T.O. Dr Carilyn Goodpasture, PharmD

## 2022-07-07 NOTE — Progress Notes (Signed)
Patient has been examined by Dr. Katragadda. Vital signs and labs have been reviewed by MD - ANC, Creatinine, LFTs, hemoglobin, and platelets are within treatment parameters per M.D. - pt may proceed with treatment.  Primary RN and pharmacy notified.  

## 2022-07-07 NOTE — Progress Notes (Signed)
Patient presents today for Velcade injection. Patient is in satisfactory condition with no new complaints voiced.  Vital signs are stable.  Labs reviewed by Dr. Ellin Saba during the office visit.   Creatinine today is 6.15.  Patient is a dialysis patient.   All other labs are within treatment parameters.  We will proceed with treatment per MD orders.   Patient tolerated injection with no complaints voiced.  Site clean and dry with no bruising or swelling noted.  No complaints of pain.  Discharged with vital signs stable and no signs or symptoms of distress noted.

## 2022-07-08 ENCOUNTER — Other Ambulatory Visit: Payer: Self-pay

## 2022-07-08 DIAGNOSIS — Z992 Dependence on renal dialysis: Secondary | ICD-10-CM | POA: Diagnosis not present

## 2022-07-08 DIAGNOSIS — N2581 Secondary hyperparathyroidism of renal origin: Secondary | ICD-10-CM | POA: Diagnosis not present

## 2022-07-08 DIAGNOSIS — N186 End stage renal disease: Secondary | ICD-10-CM | POA: Diagnosis not present

## 2022-07-10 DIAGNOSIS — N186 End stage renal disease: Secondary | ICD-10-CM | POA: Diagnosis not present

## 2022-07-10 DIAGNOSIS — N2581 Secondary hyperparathyroidism of renal origin: Secondary | ICD-10-CM | POA: Diagnosis not present

## 2022-07-10 DIAGNOSIS — Z992 Dependence on renal dialysis: Secondary | ICD-10-CM | POA: Diagnosis not present

## 2022-07-13 DIAGNOSIS — Z992 Dependence on renal dialysis: Secondary | ICD-10-CM | POA: Diagnosis not present

## 2022-07-13 DIAGNOSIS — N2581 Secondary hyperparathyroidism of renal origin: Secondary | ICD-10-CM | POA: Diagnosis not present

## 2022-07-13 DIAGNOSIS — N186 End stage renal disease: Secondary | ICD-10-CM | POA: Diagnosis not present

## 2022-07-15 DIAGNOSIS — Z992 Dependence on renal dialysis: Secondary | ICD-10-CM | POA: Diagnosis not present

## 2022-07-15 DIAGNOSIS — N2581 Secondary hyperparathyroidism of renal origin: Secondary | ICD-10-CM | POA: Diagnosis not present

## 2022-07-15 DIAGNOSIS — N186 End stage renal disease: Secondary | ICD-10-CM | POA: Diagnosis not present

## 2022-07-16 ENCOUNTER — Encounter: Payer: Self-pay | Admitting: Internal Medicine

## 2022-07-16 ENCOUNTER — Non-Acute Institutional Stay (SKILLED_NURSING_FACILITY): Payer: Medicare PPO | Admitting: Internal Medicine

## 2022-07-16 ENCOUNTER — Encounter: Payer: Self-pay | Admitting: Hematology

## 2022-07-16 DIAGNOSIS — N186 End stage renal disease: Secondary | ICD-10-CM

## 2022-07-16 DIAGNOSIS — E43 Unspecified severe protein-calorie malnutrition: Secondary | ICD-10-CM

## 2022-07-16 DIAGNOSIS — I120 Hypertensive chronic kidney disease with stage 5 chronic kidney disease or end stage renal disease: Secondary | ICD-10-CM | POA: Diagnosis not present

## 2022-07-16 DIAGNOSIS — R200 Anesthesia of skin: Secondary | ICD-10-CM | POA: Diagnosis not present

## 2022-07-16 DIAGNOSIS — Z992 Dependence on renal dialysis: Secondary | ICD-10-CM | POA: Diagnosis not present

## 2022-07-16 DIAGNOSIS — D631 Anemia in chronic kidney disease: Secondary | ICD-10-CM | POA: Diagnosis not present

## 2022-07-16 DIAGNOSIS — R202 Paresthesia of skin: Secondary | ICD-10-CM | POA: Diagnosis not present

## 2022-07-16 DIAGNOSIS — E1122 Type 2 diabetes mellitus with diabetic chronic kidney disease: Secondary | ICD-10-CM | POA: Diagnosis not present

## 2022-07-16 NOTE — Assessment & Plan Note (Deleted)
HD scheduled M , W & F

## 2022-07-16 NOTE — Assessment & Plan Note (Signed)
Last B12 level 1126 on 11/02/19; she is not on supplementation. Macrocytosis persistes. Check B12 level.

## 2022-07-16 NOTE — Assessment & Plan Note (Signed)
07/02/22 A1c 5.1%, discordance suspect due to ESRD.

## 2022-07-16 NOTE — Progress Notes (Signed)
Patient tolerated injection well with no complaints voiced.  Patient left via wheelchair with aid in stable condition.  Vital signs stable at discharge.  Follow up as scheduled.

## 2022-07-16 NOTE — Assessment & Plan Note (Addendum)
Serial blood pressures were reviewed.  Today's blood pressure appears to be an outlier.  Average will be verified; Nephrology will initiate antihypertensives if indicated.  An Epic algorithm suggested adding amlodipine versus thiazide diuretic.  The latter is contraindicated as it would not be effective in ESRD.

## 2022-07-16 NOTE — Patient Instructions (Signed)
See assessment and plan under each diagnosis in the problem list and acutely for this visit 

## 2022-07-16 NOTE — Assessment & Plan Note (Signed)
HD scheduled M , W & F 

## 2022-07-16 NOTE — Progress Notes (Signed)
NURSING HOME LOCATION:  [facility] ROOM NUMBER:  [id number]  CODE STATUS:  DNR  PCP:  Synthia Innocent NP  This is a nursing facility follow up visit of chronic medical diagnoses & to document compliance with Regulation 483.30 (c) in The Long Term Care Survey Manual Phase 2 which mandates caregiver visit ( visits can alternate among physician, PA or NP as per statutes) within 10 days of 30 days / 60 days/ 90 days post admission to SNF date    Interim medical record and care since last SNF visit was updated with review of diagnostic studies and change in clinical status since last visit were documented.  HPI: She is a permanent resident of this facility with diagnoses of end-stage renal disease on hemodialysis, diabetes with neurologic complications, GERD, glaucoma, dyslipidemia, essential hypertension, breast cancer, and multiple myeloma.  Epic most recent labs were performed late last month.  Creatinine was 6.15 and GFR 6.  As noted she is on hemodialysis Monday, Wednesday, and Friday.  Macrocytic anemia was present with H/H of 10.3/32.8 with an MCV of 102.5.  Prior H/H was 9.1/28.7.  The last B12 on record was 1126 on 11/02/2019.  She is not on a B12 supplement.  Total protein 7.2 and albumin 3.7.  A1c is 5.1% in the context of the end-stage renal disease.  Review of systems: All answers to complete review of systems was a monosyllablic "no" except for numbness in her fingers at night and intermittent nonproductive cough when she drinks fluids.  She denies frank dysphagia or other GI symptoms.  Constitutional: No fever, significant weight change, fatigue  Eyes: No redness, discharge, pain, vision change ENT/mouth: No nasal congestion,  purulent discharge, earache, change in hearing, sore throat  Cardiovascular: No chest pain, palpitations, paroxysmal nocturnal dyspnea, claudication, edema  Respiratory: No sputum production, hemoptysis, DOE, significant snoring, apnea    Gastrointestinal: No heartburn, dysphagia, abdominal pain, nausea /vomiting, rectal bleeding, melena, change in bowels Genitourinary: No dysuria, hematuria, pyuria, incontinence, nocturia Musculoskeletal: No joint stiffness, joint swelling, weakness, pain Dermatologic: No rash, pruritus, change in appearance of skin Neurologic: No dizziness, headache, syncope, seizures Psychiatric: No significant anxiety, depression, insomnia, anorexia Endocrine: No change in hair/skin/nails, excessive thirst, excessive hunger, excessive urination  Hematologic/lymphatic: No significant bruising, lymphadenopathy, abnormal bleeding Allergy/immunology: No itchy/watery eyes, significant sneezing, urticaria, angioedema  Physical exam:  Pertinent or positive findings: She exhibits a very flat affect, almost dour.  She has bilateral ptosis and puffy lower lids.  Slight tachycardia is present.  Abdomen is protuberant.  Pedal pulses are decreased.  She has nonpitting edema of the lower extremities.  Strength to opposition is fair.  Tinel's testing was negative bilaterally at the wrist.  General appearance: Adequately nourished; no acute distress, increased work of breathing is present.   Lymphatic: No lymphadenopathy about the head, neck, axilla. Eyes: No conjunctival inflammation or lid edema is present. There is no scleral icterus. Ears:  External ear exam shows no significant lesions or deformities.   Nose:  External nasal examination shows no deformity or inflammation. Nasal mucosa are pink and moist without lesions, exudates Oral exam:  Lips and gums are healthy appearing. There is no oropharyngeal erythema or exudate. Neck:  No thyromegaly, masses, tenderness noted.    Heart:  No gallop, murmur, click, rub .  Lungs: Chest clear to auscultation without wheezes, rhonchi, rales, rubs. Abdomen: Bowel sounds are normal. Abdomen is soft and nontender with no organomegaly, hernias, masses. GU: Deferred  Extremities:  No cyanosis, clubbing  Neurologic exam :Balance, Rhomberg, finger to nose testing could not be completed due to clinical state Deep tendon reflexes are equal Skin: Warm & dry w/o tenting. No significant lesions or rash.  See summary under each active problem in the Problem List with associated updated therapeutic plan

## 2022-07-16 NOTE — Patient Instructions (Signed)
[facility]  Discharge Instructions: Thank you for choosing [facility] to provide your oncology and hematology care.  If you have a lab appointment with the Cancer Center - please note that after April 8th, 2024, all labs will be drawn in the cancer center.  You do not have to check in or register with the main entrance as you have in the past but will complete your check-in in the cancer center.  Wear comfortable clothing and clothing appropriate for easy access to any Portacath or PICC line.   We strive to give you quality time with your provider. You may need to reschedule your appointment if you arrive late (15 or more minutes).  Arriving late affects you and other patients whose appointments are after yours.  Also, if you miss three or more appointments without notifying the office, you may be dismissed from the clinic at the provider's discretion.      For prescription refill requests, have your pharmacy contact our office and allow 72 hours for refills to be completed.    Today you received the following chemotherapy and/or immunotherapy agents Velcade. Bortezomib Injection What is this medication? BORTEZOMIB (bor TEZ oh mib) treats lymphoma. It may also be used to treat multiple myeloma, a type of bone marrow cancer. It works by blocking a protein that causes cancer cells to grow and multiply. This helps to slow or stop the spread of cancer cells. This medicine may be used for other purposes; ask your health care provider or pharmacist if you have questions. COMMON BRAND NAME(S): Velcade What should I tell my care team before I take this medication? They need to know if you have any of these conditions: Dehydration Diabetes Heart disease Liver disease Tingling of the fingers or toes or other nerve disorder An unusual or allergic reaction to bortezomib, other medications, foods, dyes, or preservatives If you or your partner are pregnant or trying to  get pregnant Breastfeeding How should I use this medication? This medication is injected into a vein or under the skin. It is given by your care team in a hospital or clinic setting. Talk to your care team about the use of this medication in children. Special care may be needed. Overdosage: If you think you have taken too much of this medicine contact a poison control center or emergency room at once. NOTE: This medicine is only for you. Do not share this medicine with others. What if I miss a dose? Keep appointments for follow-up doses. It is important not to miss your dose. Call your care team if you are unable to keep an appointment. What may interact with this medication? Ketoconazole Rifampin This list may not describe all possible interactions. Give your health care provider a list of all the medicines, herbs, non-prescription drugs, or dietary supplements you use. Also tell them if you smoke, drink alcohol, or use illegal drugs. Some items may interact with your medicine. What should I watch for while using this medication? Your condition will be monitored carefully while you are receiving this medication. You may need blood work while taking this medication. This medication may affect your coordination, reaction time, or judgment. Do not drive or operate machinery until you know how this medication affects you. Sit up or stand slowly to reduce the risk of dizzy or fainting spells. Drinking alcohol with this medication can increase the risk of these side effects. This medication may increase your risk of getting an infection. Call   your care team for advice if you get a fever, chills, sore throat, or other symptoms of a cold or flu. Do not treat yourself. Try to avoid being around people who are sick. Check with your care team if you have severe diarrhea, nausea, and vomiting, or if you sweat a lot. The loss of too much body fluid may make it dangerous for you to take this medication. Talk to  your care team if you may be pregnant. Serious birth defects can occur if you take this medication during pregnancy and for 7 months after the last dose. You will need a negative pregnancy test before starting this medication. Contraception is recommended while taking this medication and for 7 months after the last dose. Your care team can help you find the option that works for you. If your partner can get pregnant, use a condom during sex while taking this medication and for 4 months after the last dose. Do not breastfeed while taking this medication and for 2 months after the last dose. This medication may cause infertility. Talk to your care team if you are concerned about your fertility. What side effects may I notice from receiving this medication? Side effects that you should report to your care team as soon as possible: Allergic reactions--skin rash, itching, hives, swelling of the face, lips, tongue, or throat Bleeding--bloody or black, tar-like stools, vomiting blood or [place] material that looks like coffee grounds, red or dark [place] urine, small red or purple spots on skin, unusual bruising or bleeding Bleeding in the brain--severe headache, stiff neck, confusion, dizziness, change in vision, numbness or weakness of the face, arm, or leg, trouble speaking, trouble walking, vomiting Bowel blockage--stomach cramping, unable to have a bowel movement or pass gas, loss of appetite, vomiting Heart failure--shortness of breath, swelling of the ankles, feet, or hands, sudden weight gain, unusual weakness or fatigue Infection--fever, chills, cough, sore throat, wounds that don't heal, pain or trouble when passing urine, general feeling of discomfort or being unwell Liver injury--right upper belly pain, loss of appetite, nausea, light-colored stool, dark yellow or [place] urine, yellowing skin or eyes, unusual weakness or fatigue Low blood pressure--dizziness, feeling faint or lightheaded, blurry  vision Lung injury--shortness of breath or trouble breathing, cough, spitting up blood, chest pain, fever Pain, tingling, or numbness in the hands or feet Severe or prolonged diarrhea Stomach pain, bloody diarrhea, pale skin, unusual weakness or fatigue, decrease in the amount of urine, which may be signs of hemolytic uremic syndrome Sudden and severe headache, confusion, change in vision, seizures, which may be signs of posterior reversible encephalopathy syndrome (PRES) TTP--purple spots on the skin or inside the mouth, pale skin, yellowing skin or eyes, unusual weakness or fatigue, fever, fast or irregular heartbeat, confusion, change in vision, trouble speaking, trouble walking Tumor lysis syndrome (TLS)--nausea, vomiting, diarrhea, decrease in the amount of urine, dark urine, unusual weakness or fatigue, confusion, muscle pain or cramps, fast or irregular heartbeat, joint pain Side effects that usually do not require medical attention (report to your care team if they continue or are bothersome): Constipation Diarrhea Fatigue Loss of appetite Nausea This list may not describe all possible side effects. Call your doctor for medical advice about side effects. You may report side effects to FDA at 1-800-FDA-1088. Where should I keep my medication? This medication is given in a hospital or clinic. It will not be stored at home. NOTE: This sheet is a summary. It may not cover all possible information. If   you have questions about this medicine, talk to your doctor, pharmacist, or health care provider.  2023 Elsevier/Gold Standard (2021-07-23 00:00:00)       To help prevent nausea and vomiting after your treatment, we encourage you to take your nausea medication as directed.  BELOW ARE SYMPTOMS THAT SHOULD BE REPORTED IMMEDIATELY: *FEVER GREATER THAN 100.4 F (38 C) OR HIGHER *CHILLS OR SWEATING *NAUSEA AND VOMITING THAT IS NOT CONTROLLED WITH YOUR NAUSEA MEDICATION *UNUSUAL SHORTNESS OF  BREATH *UNUSUAL BRUISING OR BLEEDING *URINARY PROBLEMS (pain or burning when urinating, or frequent urination) *BOWEL PROBLEMS (unusual diarrhea, constipation, pain near the anus) TENDERNESS IN MOUTH AND THROAT WITH OR WITHOUT PRESENCE OF ULCERS (sore throat, sores in mouth, or a toothache) UNUSUAL RASH, SWELLING OR PAIN  UNUSUAL VAGINAL DISCHARGE OR ITCHING   Items with * indicate a potential emergency and should be followed up as soon as possible or go to the Emergency Department if any problems should occur.  Please show the CHEMOTHERAPY ALERT CARD or IMMUNOTHERAPY ALERT CARD at check-in to the Emergency Department and triage nurse.  Should you have questions after your visit or need to cancel or reschedule your appointment, please contact [facility] [phone]  and follow the prompts.  Office hours are 8:00 a.m. to 4:30 p.m. Monday - Friday. Please note that voicemails left after 4:00 p.m. may not be returned until the following business day.  We are closed weekends and major holidays. You have access to a nurse at all times for urgent questions. Please call the main number to the clinic [phone] and follow the prompts.  For any non-urgent questions, you may also contact your provider using MyChart. We now offer e-Visits for anyone 18 and older to request care online for non-urgent symptoms. For details visit [email or web address].   Also download the MyChart app! Go to the app store, search "MyChart", open the app, select [place], and log in with your MyChart username and password.   

## 2022-07-16 NOTE — Assessment & Plan Note (Signed)
07/07/2022 total protein 7.2 & albumin 3.7 , both WNL. Nutritionist to continue monitor.

## 2022-07-16 NOTE — Assessment & Plan Note (Signed)
Check TSH & B12

## 2022-07-17 DIAGNOSIS — N2581 Secondary hyperparathyroidism of renal origin: Secondary | ICD-10-CM | POA: Diagnosis not present

## 2022-07-17 DIAGNOSIS — N186 End stage renal disease: Secondary | ICD-10-CM | POA: Diagnosis not present

## 2022-07-17 DIAGNOSIS — Z992 Dependence on renal dialysis: Secondary | ICD-10-CM | POA: Diagnosis not present

## 2022-07-20 ENCOUNTER — Other Ambulatory Visit (HOSPITAL_COMMUNITY)
Admission: RE | Admit: 2022-07-20 | Discharge: 2022-07-20 | Disposition: A | Discharge status: Home / Self Care | Payer: Medicare PPO | Source: Skilled Nursing Facility | Attending: Adult Health | Admitting: Adult Health

## 2022-07-20 DIAGNOSIS — Z992 Dependence on renal dialysis: Secondary | ICD-10-CM | POA: Diagnosis not present

## 2022-07-20 DIAGNOSIS — E1122 Type 2 diabetes mellitus with diabetic chronic kidney disease: Secondary | ICD-10-CM | POA: Insufficient documentation

## 2022-07-20 DIAGNOSIS — N2581 Secondary hyperparathyroidism of renal origin: Secondary | ICD-10-CM | POA: Diagnosis not present

## 2022-07-20 DIAGNOSIS — N186 End stage renal disease: Secondary | ICD-10-CM | POA: Diagnosis not present

## 2022-07-20 LAB — VITAMIN B12: Vitamin B-12: 660 pg/mL (ref 180–914)

## 2022-07-20 LAB — TSH: TSH: 1.836 u[IU]/mL (ref 0.350–4.500)

## 2022-07-22 DIAGNOSIS — N2581 Secondary hyperparathyroidism of renal origin: Secondary | ICD-10-CM | POA: Diagnosis not present

## 2022-07-22 DIAGNOSIS — Z992 Dependence on renal dialysis: Secondary | ICD-10-CM | POA: Diagnosis not present

## 2022-07-22 DIAGNOSIS — N186 End stage renal disease: Secondary | ICD-10-CM | POA: Diagnosis not present

## 2022-07-24 DIAGNOSIS — N2581 Secondary hyperparathyroidism of renal origin: Secondary | ICD-10-CM | POA: Diagnosis not present

## 2022-07-24 DIAGNOSIS — N186 End stage renal disease: Secondary | ICD-10-CM | POA: Diagnosis not present

## 2022-07-24 DIAGNOSIS — Z992 Dependence on renal dialysis: Secondary | ICD-10-CM | POA: Diagnosis not present

## 2022-07-27 DIAGNOSIS — N186 End stage renal disease: Secondary | ICD-10-CM | POA: Diagnosis not present

## 2022-07-27 DIAGNOSIS — Z992 Dependence on renal dialysis: Secondary | ICD-10-CM | POA: Diagnosis not present

## 2022-07-27 DIAGNOSIS — N2581 Secondary hyperparathyroidism of renal origin: Secondary | ICD-10-CM | POA: Diagnosis not present

## 2022-07-29 DIAGNOSIS — N186 End stage renal disease: Secondary | ICD-10-CM | POA: Diagnosis not present

## 2022-07-29 DIAGNOSIS — Z992 Dependence on renal dialysis: Secondary | ICD-10-CM | POA: Diagnosis not present

## 2022-07-29 DIAGNOSIS — N2581 Secondary hyperparathyroidism of renal origin: Secondary | ICD-10-CM | POA: Diagnosis not present

## 2022-07-31 DIAGNOSIS — Z992 Dependence on renal dialysis: Secondary | ICD-10-CM | POA: Diagnosis not present

## 2022-07-31 DIAGNOSIS — N2581 Secondary hyperparathyroidism of renal origin: Secondary | ICD-10-CM | POA: Diagnosis not present

## 2022-07-31 DIAGNOSIS — N186 End stage renal disease: Secondary | ICD-10-CM | POA: Diagnosis not present

## 2022-08-01 ENCOUNTER — Other Ambulatory Visit: Payer: Self-pay

## 2022-08-03 DIAGNOSIS — Z992 Dependence on renal dialysis: Secondary | ICD-10-CM | POA: Diagnosis not present

## 2022-08-03 DIAGNOSIS — N186 End stage renal disease: Secondary | ICD-10-CM | POA: Diagnosis not present

## 2022-08-03 DIAGNOSIS — N2581 Secondary hyperparathyroidism of renal origin: Secondary | ICD-10-CM | POA: Diagnosis not present

## 2022-08-04 ENCOUNTER — Inpatient Hospital Stay: Payer: Medicare PPO

## 2022-08-04 ENCOUNTER — Inpatient Hospital Stay: Payer: Medicare PPO | Attending: Hematology

## 2022-08-04 VITALS — BP 148/76 | HR 86 | Temp 98.5°F | Resp 18 | Wt 139.6 lb

## 2022-08-04 DIAGNOSIS — Z5112 Encounter for antineoplastic immunotherapy: Secondary | ICD-10-CM | POA: Insufficient documentation

## 2022-08-04 DIAGNOSIS — C9000 Multiple myeloma not having achieved remission: Secondary | ICD-10-CM | POA: Insufficient documentation

## 2022-08-04 LAB — CBC WITH DIFFERENTIAL/PLATELET
Abs Immature Granulocytes: 0.01 10*3/uL (ref 0.00–0.07)
Basophils Absolute: 0 10*3/uL (ref 0.0–0.1)
Basophils Relative: 1 %
Eosinophils Absolute: 0.1 10*3/uL (ref 0.0–0.5)
Eosinophils Relative: 3 %
HCT: 33.1 % — ABNORMAL LOW (ref 36.0–46.0)
Hemoglobin: 10.7 g/dL — ABNORMAL LOW (ref 12.0–15.0)
Immature Granulocytes: 0 %
Lymphocytes Relative: 22 %
Lymphs Abs: 1 10*3/uL (ref 0.7–4.0)
MCH: 32.4 pg (ref 26.0–34.0)
MCHC: 32.3 g/dL (ref 30.0–36.0)
MCV: 100.3 fL — ABNORMAL HIGH (ref 80.0–100.0)
Monocytes Absolute: 0.5 10*3/uL (ref 0.1–1.0)
Monocytes Relative: 11 %
Neutro Abs: 2.9 10*3/uL (ref 1.7–7.7)
Neutrophils Relative %: 63 %
Platelets: 234 10*3/uL (ref 150–400)
RBC: 3.3 MIL/uL — ABNORMAL LOW (ref 3.87–5.11)
RDW: 15 % (ref 11.5–15.5)
WBC: 4.6 10*3/uL (ref 4.0–10.5)
nRBC: 0 % (ref 0.0–0.2)

## 2022-08-04 LAB — COMPREHENSIVE METABOLIC PANEL
ALT: 13 U/L (ref 0–44)
AST: 15 U/L (ref 15–41)
Albumin: 3.7 g/dL (ref 3.5–5.0)
Alkaline Phosphatase: 52 U/L (ref 38–126)
Anion gap: 17 — ABNORMAL HIGH (ref 5–15)
BUN: 41 mg/dL — ABNORMAL HIGH (ref 8–23)
CO2: 23 mmol/L (ref 22–32)
Calcium: 9.8 mg/dL (ref 8.9–10.3)
Chloride: 95 mmol/L — ABNORMAL LOW (ref 98–111)
Creatinine, Ser: 6.46 mg/dL — ABNORMAL HIGH (ref 0.44–1.00)
GFR, Estimated: 6 mL/min — ABNORMAL LOW (ref >60)
Glucose, Bld: 120 mg/dL — ABNORMAL HIGH (ref 70–99)
Potassium: 4.2 mmol/L (ref 3.5–5.1)
Sodium: 135 mmol/L (ref 135–145)
Total Bilirubin: 0.7 mg/dL (ref 0.3–1.2)
Total Protein: 7.3 g/dL (ref 6.5–8.1)

## 2022-08-04 LAB — MAGNESIUM: Magnesium: 2.4 mg/dL (ref 1.7–2.4)

## 2022-08-04 MED ORDER — PROCHLORPERAZINE MALEATE 10 MG PO TABS
10.0000 mg | ORAL_TABLET | Freq: Once | ORAL | Status: AC
  Administered 2022-08-04: 10 mg via ORAL
  Filled 2022-08-04: qty 1

## 2022-08-04 MED ORDER — BORTEZOMIB CHEMO SQ INJECTION 3.5 MG (2.5MG/ML)
1.2000 mg/m2 | Freq: Once | INTRAMUSCULAR | Status: AC
  Administered 2022-08-04: 2 mg via SUBCUTANEOUS
  Filled 2022-08-04: qty 0.8

## 2022-08-04 NOTE — Patient Instructions (Signed)
[facility]  Discharge Instructions: Thank you for choosing [facility] to provide your oncology and hematology care.  If you have a lab appointment with the Cancer Center - please note that after April 8th, 2024, all labs will be drawn in the cancer center.  You do not have to check in or register with the main entrance as you have in the past but will complete your check-in in the cancer center.  Wear comfortable clothing and clothing appropriate for easy access to any Portacath or PICC line.   We strive to give you quality time with your provider. You may need to reschedule your appointment if you arrive late (15 or more minutes).  Arriving late affects you and other patients whose appointments are after yours.  Also, if you miss three or more appointments without notifying the office, you may be dismissed from the clinic at the provider's discretion.      For prescription refill requests, have your pharmacy contact our office and allow 72 hours for refills to be completed.    Today you received the following chemotherapy and/or immunotherapy agents Velcade injection.  Bortezomib Injection What is this medication? BORTEZOMIB (bor TEZ oh mib) treats lymphoma. It may also be used to treat multiple myeloma, a type of bone marrow cancer. It works by blocking a protein that causes cancer cells to grow and multiply. This helps to slow or stop the spread of cancer cells. This medicine may be used for other purposes; ask your health care provider or pharmacist if you have questions. COMMON BRAND NAME(S): Velcade What should I tell my care team before I take this medication? They need to know if you have any of these conditions: Dehydration Diabetes Heart disease Liver disease Tingling of the fingers or toes or other nerve disorder An unusual or allergic reaction to bortezomib, other medications, foods, dyes, or preservatives If you or your partner are pregnant  or trying to get pregnant Breastfeeding How should I use this medication? This medication is injected into a vein or under the skin. It is given by your care team in a hospital or clinic setting. Talk to your care team about the use of this medication in children. Special care may be needed. Overdosage: If you think you have taken too much of this medicine contact a poison control center or emergency room at once. NOTE: This medicine is only for you. Do not share this medicine with others. What if I miss a dose? Keep appointments for follow-up doses. It is important not to miss your dose. Call your care team if you are unable to keep an appointment. What may interact with this medication? Ketoconazole Rifampin This list may not describe all possible interactions. Give your health care provider a list of all the medicines, herbs, non-prescription drugs, or dietary supplements you use. Also tell them if you smoke, drink alcohol, or use illegal drugs. Some items may interact with your medicine. What should I watch for while using this medication? Your condition will be monitored carefully while you are receiving this medication. You may need blood work while taking this medication. This medication may affect your coordination, reaction time, or judgment. Do not drive or operate machinery until you know how this medication affects you. Sit up or stand slowly to reduce the risk of dizzy or fainting spells. Drinking alcohol with this medication can increase the risk of these side effects. This medication may increase your risk of getting an  infection. Call your care team for advice if you get a fever, chills, sore throat, or other symptoms of a cold or flu. Do not treat yourself. Try to avoid being around people who are sick. Check with your care team if you have severe diarrhea, nausea, and vomiting, or if you sweat a lot. The loss of too much body fluid may make it dangerous for you to take this  medication. Talk to your care team if you may be pregnant. Serious birth defects can occur if you take this medication during pregnancy and for 7 months after the last dose. You will need a negative pregnancy test before starting this medication. Contraception is recommended while taking this medication and for 7 months after the last dose. Your care team can help you find the option that works for you. If your partner can get pregnant, use a condom during sex while taking this medication and for 4 months after the last dose. Do not breastfeed while taking this medication and for 2 months after the last dose. This medication may cause infertility. Talk to your care team if you are concerned about your fertility. What side effects may I notice from receiving this medication? Side effects that you should report to your care team as soon as possible: Allergic reactions--skin rash, itching, hives, swelling of the face, lips, tongue, or throat Bleeding--bloody or black, tar-like stools, vomiting blood or brown material that looks like coffee grounds, red or dark brown urine, small red or purple spots on skin, unusual bruising or bleeding Bleeding in the brain--severe headache, stiff neck, confusion, dizziness, change in vision, numbness or weakness of the face, arm, or leg, trouble speaking, trouble walking, vomiting Bowel blockage--stomach cramping, unable to have a bowel movement or pass gas, loss of appetite, vomiting Heart failure--shortness of breath, swelling of the ankles, feet, or hands, sudden weight gain, unusual weakness or fatigue Infection--fever, chills, cough, sore throat, wounds that don't heal, pain or trouble when passing urine, general feeling of discomfort or being unwell Liver injury--right upper belly pain, loss of appetite, nausea, light-colored stool, dark yellow or brown urine, yellowing skin or eyes, unusual weakness or fatigue Low blood pressure--dizziness, feeling faint or  lightheaded, blurry vision Lung injury--shortness of breath or trouble breathing, cough, spitting up blood, chest pain, fever Pain, tingling, or numbness in the hands or feet Severe or prolonged diarrhea Stomach pain, bloody diarrhea, pale skin, unusual weakness or fatigue, decrease in the amount of urine, which may be signs of hemolytic uremic syndrome Sudden and severe headache, confusion, change in vision, seizures, which may be signs of posterior reversible encephalopathy syndrome (PRES) TTP--purple spots on the skin or inside the mouth, pale skin, yellowing skin or eyes, unusual weakness or fatigue, fever, fast or irregular heartbeat, confusion, change in vision, trouble speaking, trouble walking Tumor lysis syndrome (TLS)--nausea, vomiting, diarrhea, decrease in the amount of urine, dark urine, unusual weakness or fatigue, confusion, muscle pain or cramps, fast or irregular heartbeat, joint pain Side effects that usually do not require medical attention (report to your care team if they continue or are bothersome): Constipation Diarrhea Fatigue Loss of appetite Nausea This list may not describe all possible side effects. Call your doctor for medical advice about side effects. You may report side effects to FDA at 1-800-FDA-1088. Where should I keep my medication? This medication is given in a hospital or clinic. It will not be stored at home. NOTE: This sheet is a summary. It may not cover all possible  information. If you have questions about this medicine, talk to your doctor, pharmacist, or health care provider.  2024 Elsevier/Gold Standard (2021-07-29 00:00:00)       To help prevent nausea and vomiting after your treatment, we encourage you to take your nausea medication as directed.  BELOW ARE SYMPTOMS THAT SHOULD BE REPORTED IMMEDIATELY: *FEVER GREATER THAN 100.4 F (38 C) OR HIGHER *CHILLS OR SWEATING *NAUSEA AND VOMITING THAT IS NOT CONTROLLED WITH YOUR NAUSEA  MEDICATION *UNUSUAL SHORTNESS OF BREATH *UNUSUAL BRUISING OR BLEEDING *URINARY PROBLEMS (pain or burning when urinating, or frequent urination) *BOWEL PROBLEMS (unusual diarrhea, constipation, pain near the anus) TENDERNESS IN MOUTH AND THROAT WITH OR WITHOUT PRESENCE OF ULCERS (sore throat, sores in mouth, or a toothache) UNUSUAL RASH, SWELLING OR PAIN  UNUSUAL VAGINAL DISCHARGE OR ITCHING   Items with * indicate a potential emergency and should be followed up as soon as possible or go to the Emergency Department if any problems should occur.  Please show the CHEMOTHERAPY ALERT CARD or IMMUNOTHERAPY ALERT CARD at check-in to the Emergency Department and triage nurse.  Should you have questions after your visit or need to cancel or reschedule your appointment, please contact [facility] [phone]  and follow the prompts.  Office hours are 8:00 a.m. to 4:30 p.m. Monday - Friday. Please note that voicemails left after 4:00 p.m. may not be returned until the following business day.  We are closed weekends and major holidays. You have access to a nurse at all times for urgent questions. Please call the main number to the clinic [phone] and follow the prompts.  For any non-urgent questions, you may also contact your provider using MyChart. We now offer e-Visits for anyone 8 and older to request care online for non-urgent symptoms. For details visit [email or web address].   Also download the MyChart app! Go to the app store, search "MyChart", open the app, select [place], and log in with your MyChart username and password.

## 2022-08-04 NOTE — Progress Notes (Signed)
Patient presents today for Velcade injection. Patient denies any side effects related to treatment. Patient has no complaints today and denies any pain. Vital signs within parameters for treatment. Labs pending.   Labs within parameters for treatment. Nursing communication disregard creatinine value. Patient on dialysis.   Treatment given today per MD orders. Tolerated without adverse affects. Vital signs stable. No complaints at this time. Discharged from clinic by wheel chair in stable condition. Alert and oriented x 3. F/U with [facility] as scheduled.

## 2022-08-05 DIAGNOSIS — N186 End stage renal disease: Secondary | ICD-10-CM | POA: Diagnosis not present

## 2022-08-05 DIAGNOSIS — Z992 Dependence on renal dialysis: Secondary | ICD-10-CM | POA: Diagnosis not present

## 2022-08-05 DIAGNOSIS — N2581 Secondary hyperparathyroidism of renal origin: Secondary | ICD-10-CM | POA: Diagnosis not present

## 2022-08-05 IMAGING — DX DG FEMUR 2+V PORT*R*
5 series · 5 of 5 positions shown · non-contrast
Comparison: Intraoperative views earlier the same date. Radiographs
07/11/2020.

CLINICAL DATA: Postop femoral ORIF.

EXAM:
RIGHT FEMUR PORTABLE 2 VIEW

[femur ap proximal (1 of 4)]
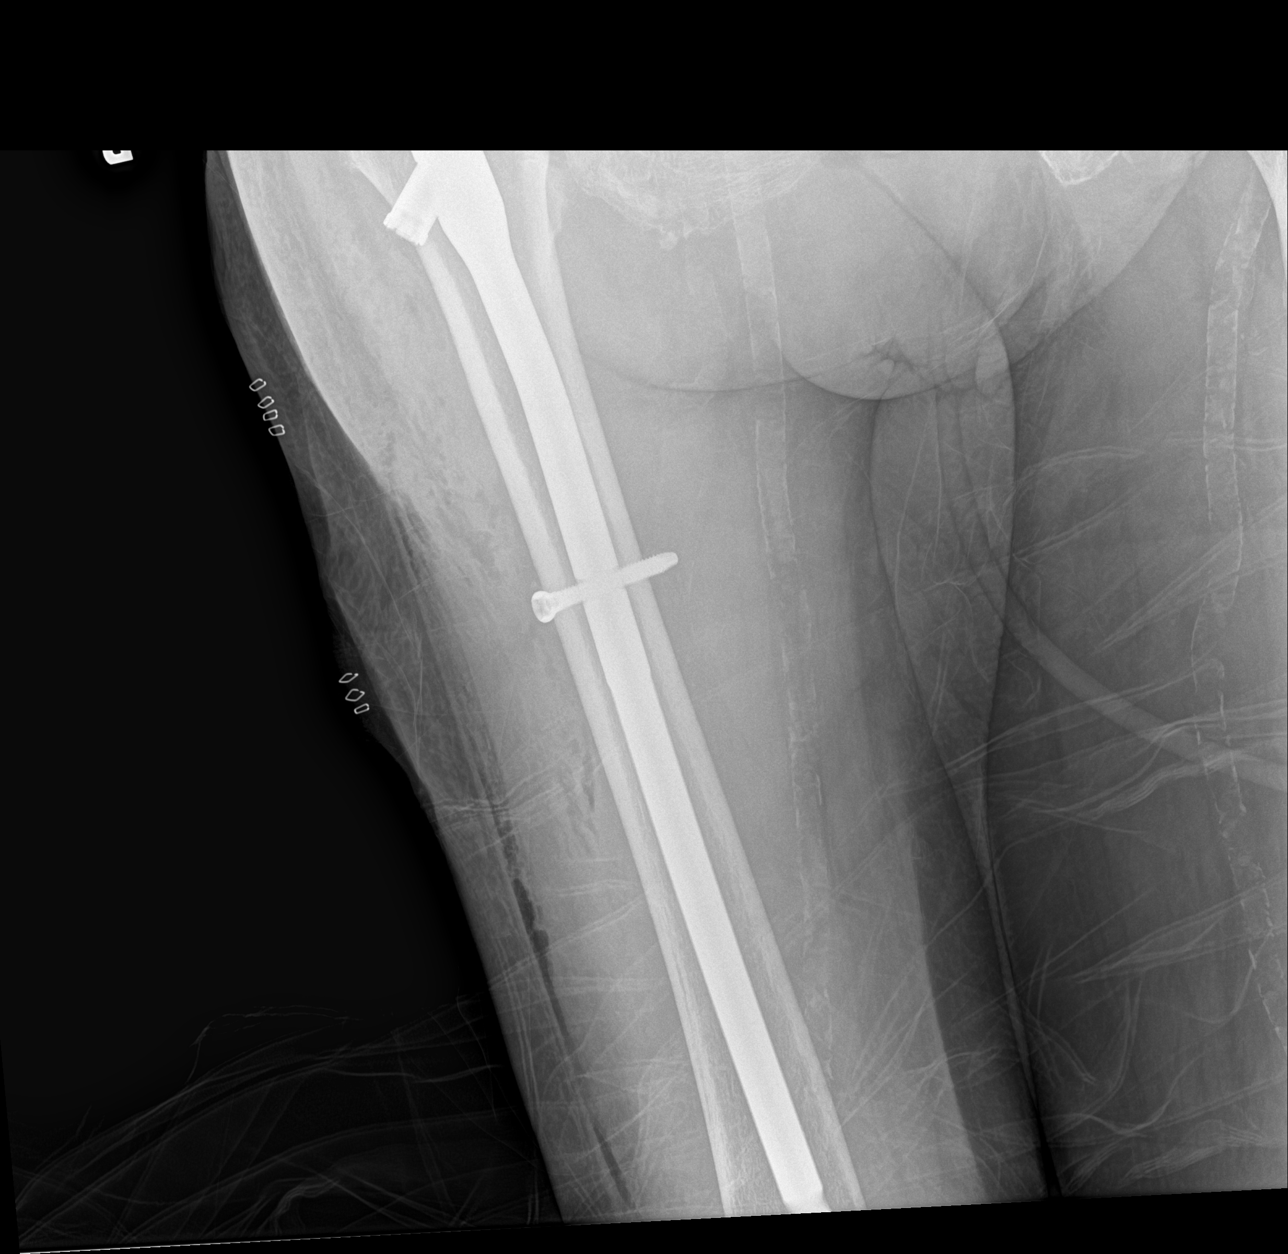

[femur ap proximal (2 of 4)]
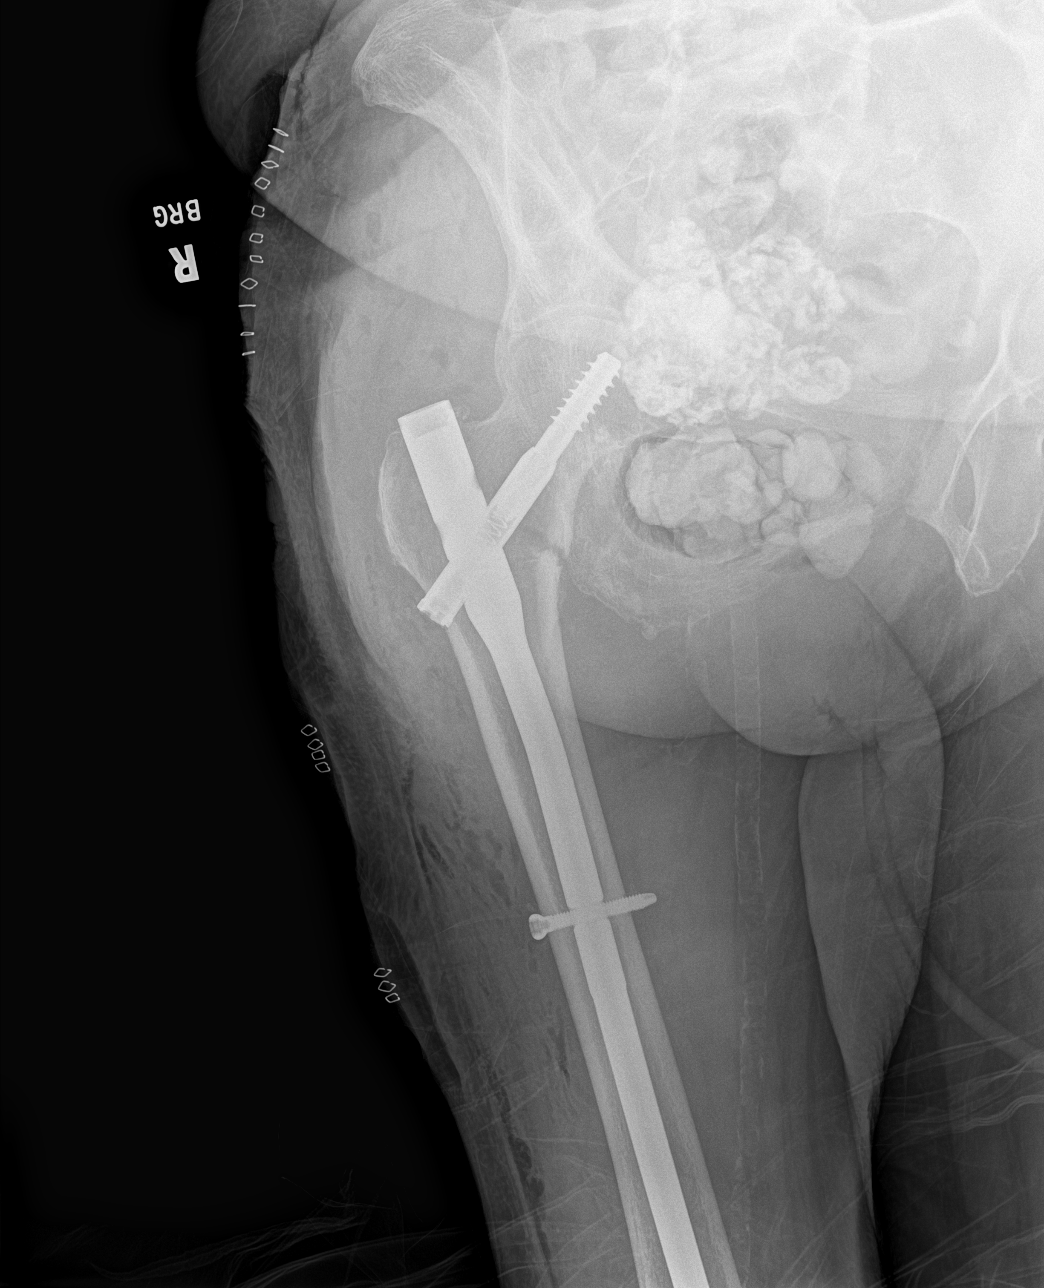

[femur ap proximal (3 of 4)]
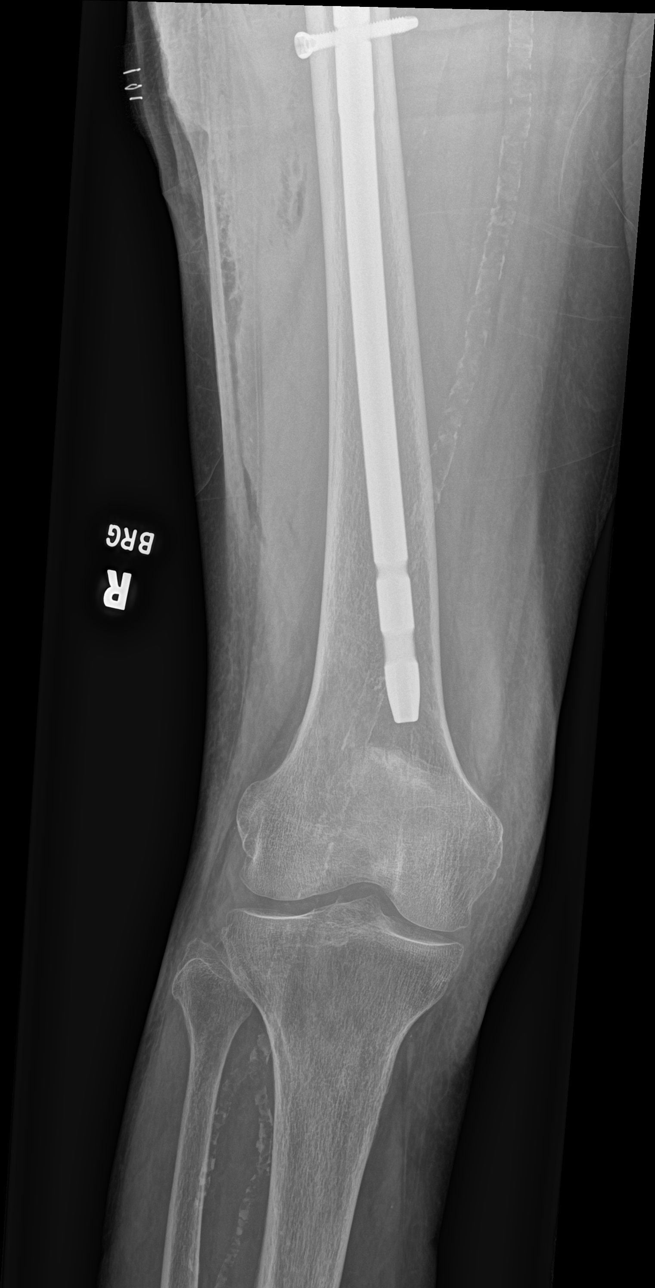

[femur lat distal]
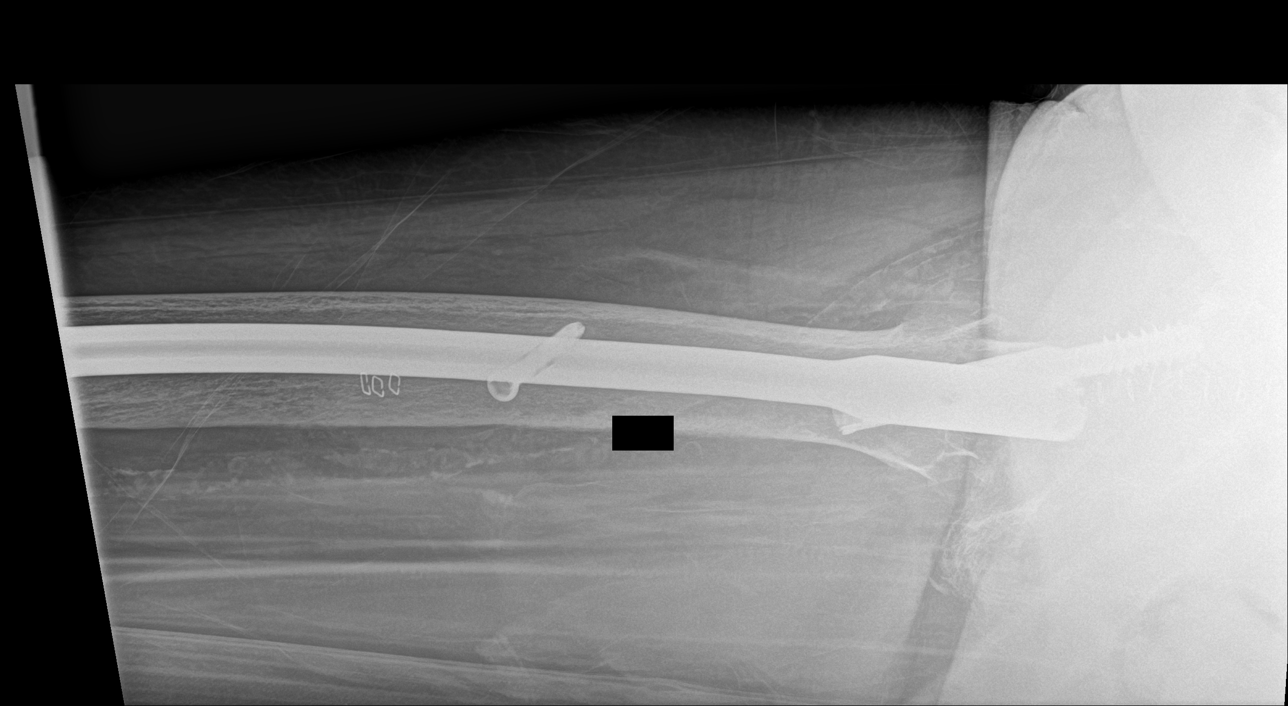

[femur ap proximal (4 of 4)]
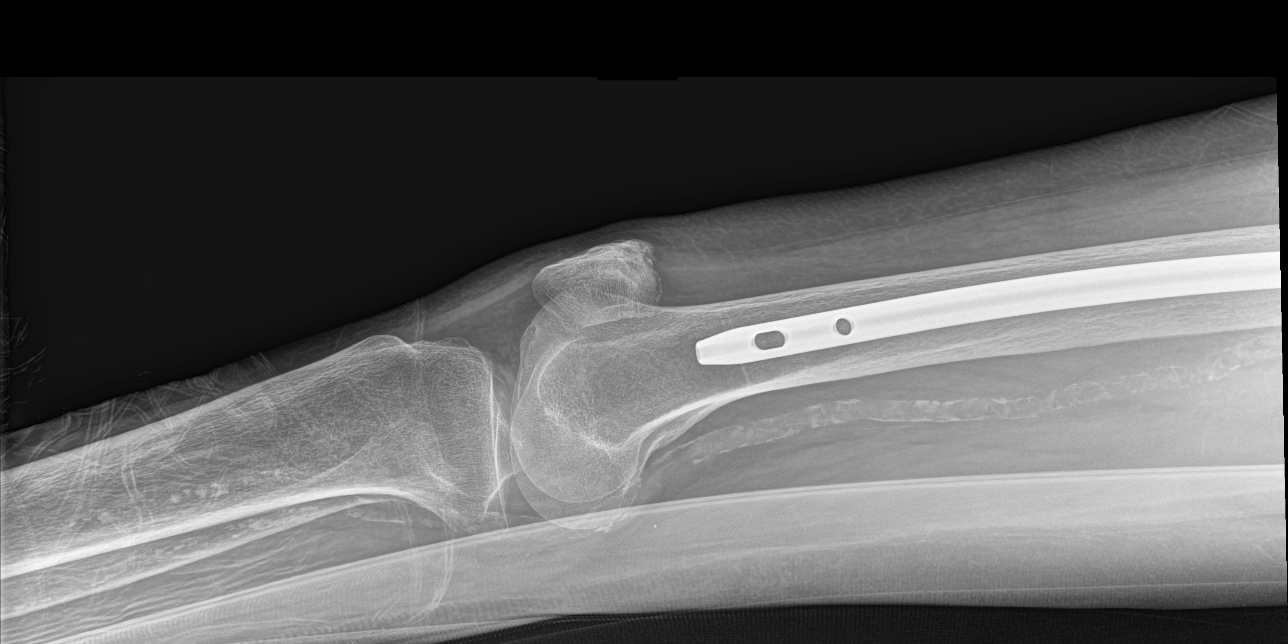

[5 of 5 positions shown; findings below may reference images not displayed]

FINDINGS: Status post right femoral dynamic screw and intramedullary fixation
of the comminuted intertrochanteric femur fracture. There is near
anatomic reduction of the fracture. The hardware is well positioned.
Lateral skin staples are present superior to the greater trochanter,
and there is soft tissue emphysema laterally in the proximal to mid
right thigh. No complications identified. Calcified uterine fibroids
and femoral atherosclerosis are noted.
IMPRESSION: Near anatomic reduction of intertrochanteric right femur fracture
post ORIF.

## 2022-08-07 DIAGNOSIS — Z992 Dependence on renal dialysis: Secondary | ICD-10-CM | POA: Diagnosis not present

## 2022-08-07 DIAGNOSIS — N2581 Secondary hyperparathyroidism of renal origin: Secondary | ICD-10-CM | POA: Diagnosis not present

## 2022-08-07 DIAGNOSIS — N186 End stage renal disease: Secondary | ICD-10-CM | POA: Diagnosis not present

## 2022-08-07 IMAGING — DX DG CHEST 1V PORT
1 series · 1 of 1 positions shown · non-contrast
Comparison: Prior chest x-ray 07/11/2020

CLINICAL DATA: Short of breath

EXAM:
PORTABLE CHEST 1 VIEW

[chest ap]
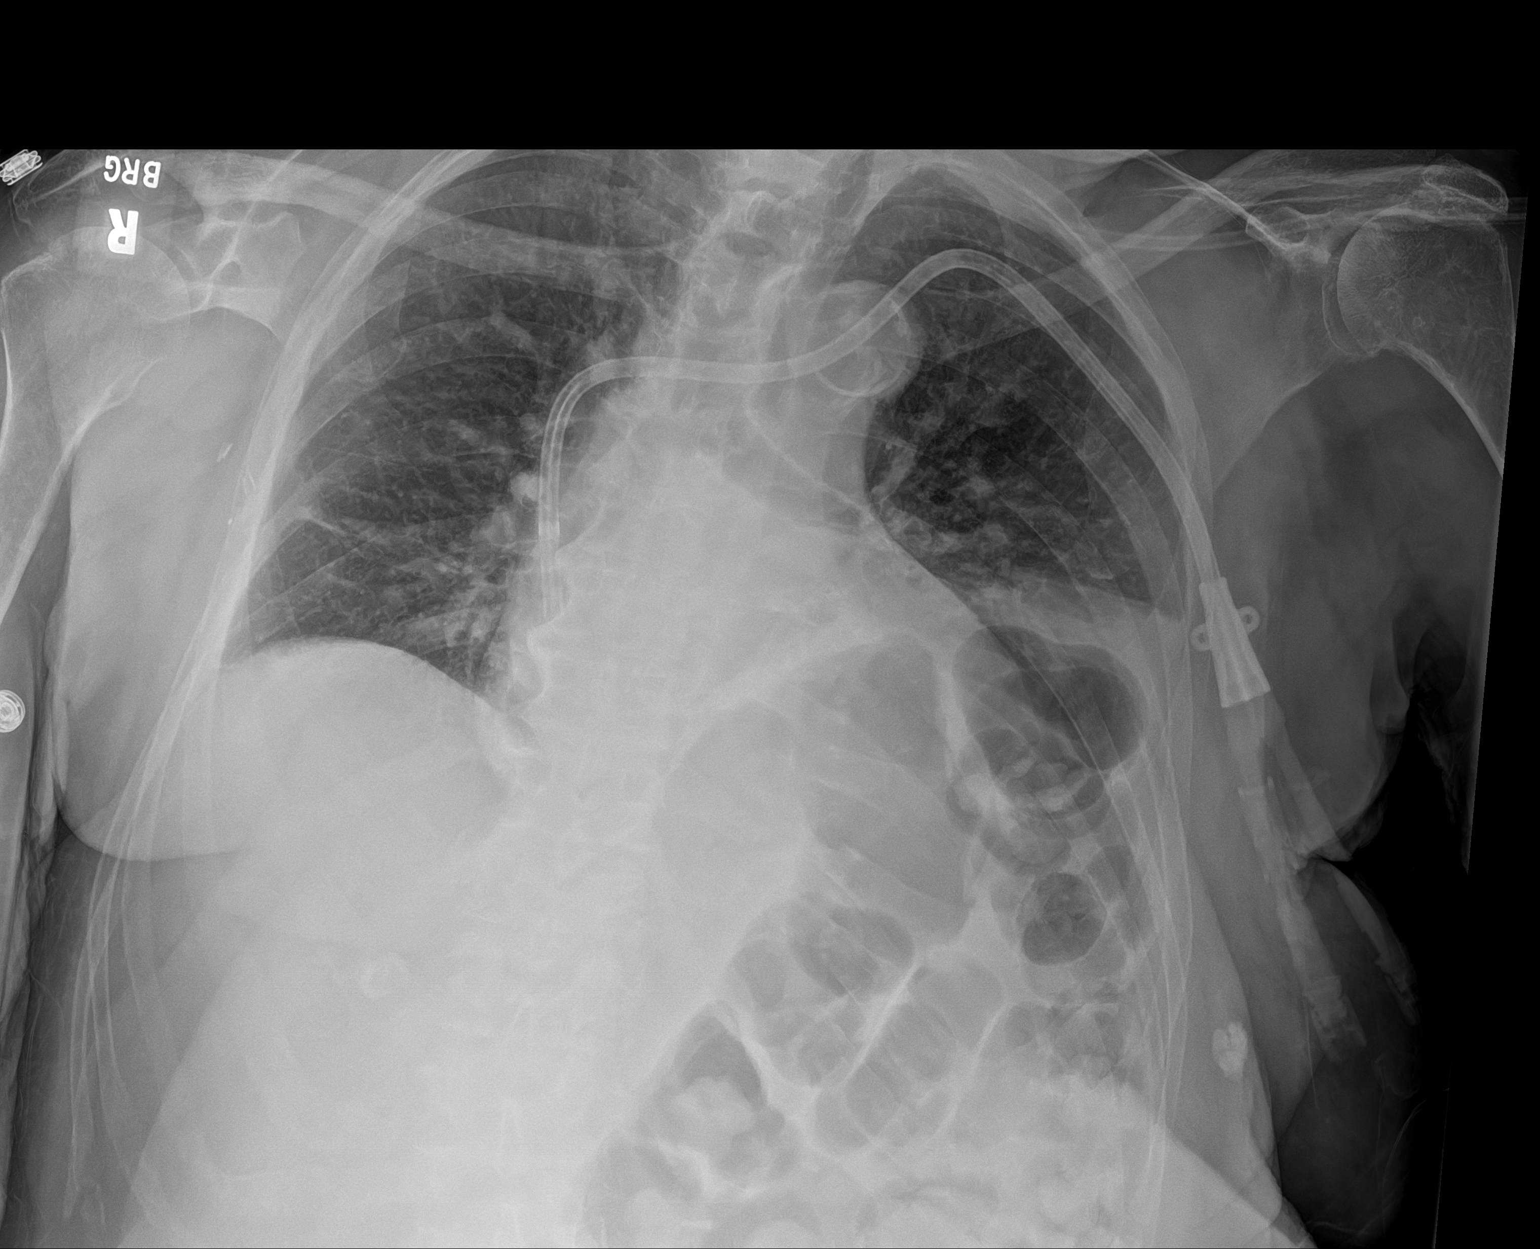

[1 of 1 positions shown; findings below may reference images not displayed]

FINDINGS: Left IJ approach tunneled hemodialysis catheter. Catheter tip
overlies the superior cavoatrial junction. Stable cardiac and
mediastinal contours. Atherosclerotic calcifications again
visualized in the transverse aorta. Increased pulmonary vascular
congestion. Chronic elevation of the left hemidiaphragm with left
basilar atelectasis. No pneumothorax. No acute osseous abnormality.
IMPRESSION: 1. Increased pulmonary vascular congestion suggests either mild CHF
or fluid overload.
2. Stable and well-positioned left IJ tunneled hemodialysis
catheter.

## 2022-08-08 DIAGNOSIS — Z992 Dependence on renal dialysis: Secondary | ICD-10-CM | POA: Diagnosis not present

## 2022-08-08 DIAGNOSIS — N186 End stage renal disease: Secondary | ICD-10-CM | POA: Diagnosis not present

## 2022-08-10 DIAGNOSIS — N2581 Secondary hyperparathyroidism of renal origin: Secondary | ICD-10-CM | POA: Diagnosis not present

## 2022-08-10 DIAGNOSIS — Z992 Dependence on renal dialysis: Secondary | ICD-10-CM | POA: Diagnosis not present

## 2022-08-10 DIAGNOSIS — N186 End stage renal disease: Secondary | ICD-10-CM | POA: Diagnosis not present

## 2022-08-12 DIAGNOSIS — N2581 Secondary hyperparathyroidism of renal origin: Secondary | ICD-10-CM | POA: Diagnosis not present

## 2022-08-12 DIAGNOSIS — N186 End stage renal disease: Secondary | ICD-10-CM | POA: Diagnosis not present

## 2022-08-12 DIAGNOSIS — Z992 Dependence on renal dialysis: Secondary | ICD-10-CM | POA: Diagnosis not present

## 2022-08-14 DIAGNOSIS — N2581 Secondary hyperparathyroidism of renal origin: Secondary | ICD-10-CM | POA: Diagnosis not present

## 2022-08-14 DIAGNOSIS — N186 End stage renal disease: Secondary | ICD-10-CM | POA: Diagnosis not present

## 2022-08-14 DIAGNOSIS — Z992 Dependence on renal dialysis: Secondary | ICD-10-CM | POA: Diagnosis not present

## 2022-08-17 DIAGNOSIS — N2581 Secondary hyperparathyroidism of renal origin: Secondary | ICD-10-CM | POA: Diagnosis not present

## 2022-08-17 DIAGNOSIS — Z992 Dependence on renal dialysis: Secondary | ICD-10-CM | POA: Diagnosis not present

## 2022-08-17 DIAGNOSIS — N186 End stage renal disease: Secondary | ICD-10-CM | POA: Diagnosis not present

## 2022-08-18 ENCOUNTER — Inpatient Hospital Stay: Payer: Medicare PPO

## 2022-08-18 DIAGNOSIS — N2581 Secondary hyperparathyroidism of renal origin: Secondary | ICD-10-CM | POA: Diagnosis not present

## 2022-08-18 DIAGNOSIS — N186 End stage renal disease: Secondary | ICD-10-CM | POA: Diagnosis not present

## 2022-08-18 DIAGNOSIS — Z992 Dependence on renal dialysis: Secondary | ICD-10-CM | POA: Diagnosis not present

## 2022-08-18 DIAGNOSIS — T8249XA Other complication of vascular dialysis catheter, initial encounter: Secondary | ICD-10-CM | POA: Diagnosis not present

## 2022-08-19 DIAGNOSIS — N186 End stage renal disease: Secondary | ICD-10-CM | POA: Diagnosis not present

## 2022-08-19 DIAGNOSIS — Z992 Dependence on renal dialysis: Secondary | ICD-10-CM | POA: Diagnosis not present

## 2022-08-19 DIAGNOSIS — N2581 Secondary hyperparathyroidism of renal origin: Secondary | ICD-10-CM | POA: Diagnosis not present

## 2022-08-20 ENCOUNTER — Non-Acute Institutional Stay (SKILLED_NURSING_FACILITY): Payer: Medicare PPO | Admitting: Adult Health

## 2022-08-20 ENCOUNTER — Encounter: Payer: Self-pay | Admitting: Adult Health

## 2022-08-20 DIAGNOSIS — I120 Hypertensive chronic kidney disease with stage 5 chronic kidney disease or end stage renal disease: Secondary | ICD-10-CM | POA: Diagnosis not present

## 2022-08-20 DIAGNOSIS — Z992 Dependence on renal dialysis: Secondary | ICD-10-CM

## 2022-08-20 DIAGNOSIS — N186 End stage renal disease: Secondary | ICD-10-CM

## 2022-08-20 DIAGNOSIS — G8929 Other chronic pain: Secondary | ICD-10-CM | POA: Diagnosis not present

## 2022-08-20 DIAGNOSIS — E1122 Type 2 diabetes mellitus with diabetic chronic kidney disease: Secondary | ICD-10-CM

## 2022-08-20 NOTE — Progress Notes (Signed)
Location:  [facility] Room Number: 148 D Place of Service:  SNF (31)   CODE STATUS: DNR  Allergies  Allergen Reactions   Ace Inhibitors Other (See Comments) and Cough    Tongue swell , ie angioedema   Angiotensin Receptor Blockers     Angioedema with ACE-I   Penicillins Other (See Comments)    Unsure of reaction Has patient had a PCN reaction causing immediate rash, facial/tongue/throat swelling, SOB or lightheadedness with hypotension: Unknown Has patient had a PCN reaction causing severe rash involving mucus membranes or skin necrosis: Unknown Has patient had a PCN reaction that required hospitalization: No Has patient had a PCN reaction occurring within the last 10 years: Unknown If all of the above answers are "NO", then may proceed with Cephalosporin use.     Penicillin G     Chief Complaint  Patient presents with   Medical Management of Chronic Issues                  Generalized chronic pain:  Chronic kidney disease with end stage renal disease on dialysis due to type 2 diabetes mellitus/dependence on dialysis: Hypertension associated with end stage renal disease (ESRD) due to type 2 diabetes mellitus:     HPI:  She is a 87 year old long term resident of this facility being seen for the management of her chronic illnesses:   Generalized chronic pain:  Chronic kidney disease with end stage renal disease on dialysis due to type 2 diabetes mellitus/dependence on dialysis: Hypertension associated with end stage renal disease (ESRD) due to type 2 diabetes mellitus: there are no reports of uncontrolled pain. Her weight is stable. There are no reports of anxiety or agitation.   Past Medical History:  Diagnosis Date   Anemia     chronic macrocytic anemia   Anxiety    Chronic kidney disease    Chronic renal disease, stage 4, severely decreased glomerular filtration rate (GFR) between 15-29 mL/min/1.73 square meter (HCC) 08/22/2015   Complication of  anesthesia    delirious after Breast Surgery   Dementia (HCC)    mild   Depression    Diabetes mellitus with ESRD (end-stage renal disease) (HCC)    type II   Dysphagia    Dyspnea    with activity   GERD (gastroesophageal reflux disease)    Glaucoma    Hyperlipidemia    Hypertension    Multiple myeloma (HCC)    Pneumonia    Stage 1 infiltrating ductal carcinoma of right female breast (HCC) 08/21/2015   ER+ PR+ HER 2 neu + (3+) T1cN0     Past Surgical History:  Procedure Laterality Date   A/V FISTULAGRAM Right 07/05/2020   Procedure: A/V Fistulagram;  Surgeon: Sherren Kerns, MD;  [facility];  Service: Cardiovascular;  Laterality: Right;   AV FISTULA PLACEMENT Left 11/22/2017   Procedure: ARTERIOVENOUS (AV) FISTULA CREATION LEFT ARM;  Surgeon: Sherren Kerns, MD;  Location: [facility] OR;  Service: Vascular;  Laterality: Left;   AV FISTULA PLACEMENT Right 04/04/2020   Procedure: RIGHT ARM ARTERIOVENOUS FISTULA CREATION;  Surgeon: Larina Earthly, MD;  Location: [facility];  Service: Vascular;  Laterality: Right;   AV FISTULA PLACEMENT Right 08/20/2020   Procedure: ARTERIOVENOUS (AV) FISTULA LIGATION RIGHT ARM;  Surgeon: Sherren Kerns, MD;  Location: [facility] OR;  Service: Vascular;  Laterality: Right;   BIOPSY  08/07/2016   Procedure: BIOPSY;  Surgeon: Corbin Ade, MD;  Location: [facility];  Service: Endoscopy;;  gastric ulcer biopsy   COLONOSCOPY     ESOPHAGOGASTRODUODENOSCOPY N/A 08/07/2016   LA Grade A esophagitis s/p dilation, small hiatal hernia, multiple gastric ulcers and erosions, duodenal erosions s/p biopsy. Negative H.pylori    ESOPHAGOGASTRODUODENOSCOPY N/A 11/27/2016   normal esophagus, previously noted gastric ulcers completely healed, normal duodenum.    ESOPHAGOGASTRODUODENOSCOPY (EGD) WITH PROPOFOL N/A 07/23/2020   Procedure: ESOPHAGOGASTRODUODENOSCOPY (EGD) WITH PROPOFOL;  Surgeon: Malissa Hippo, MD;  [facility];  Service:  Endoscopy;  Laterality: N/A;   FISTULA SUPERFICIALIZATION Left 02/14/2018   Procedure: FISTULA SUPERFICIALIZATION LEFT ARM;  Surgeon: Chuck Hint, MD;  Location: [facility] OR;  Service: Vascular;  Laterality: Left;   FRACTURE SURGERY Right    ankle   HOT HEMOSTASIS  07/23/2020   Procedure: HOT HEMOSTASIS (ARGON PLASMA COAGULATION/BICAP);  Surgeon: Malissa Hippo, MD;  [facility];  Service: Endoscopy;;   INTRAMEDULLARY (IM) NAIL INTERTROCHANTERIC Right 07/12/2020   Procedure: INTRAMEDULLARY (IM) NAIL INTERTROCHANTRIC;  Surgeon: Oliver Barre, MD;  Location: [facility];  Service: Orthopedics;  Laterality: Right;   MALONEY DILATION N/A 08/07/2016   Procedure: Elease Hashimoto DILATION;  Surgeon: Corbin Ade, MD;  [facility];  Service: Endoscopy;  Laterality: N/A;   MASTECTOMY, PARTIAL Right    PERIPHERAL VASCULAR BALLOON ANGIOPLASTY Left 07/13/2019   Procedure: PERIPHERAL VASCULAR BALLOON ANGIOPLASTY;  Surgeon: Cephus Shelling, MD;  [facility];  Service: Cardiovascular;  Laterality: Left;  arm fistulogram   PERIPHERAL VASCULAR BALLOON ANGIOPLASTY Right 05/22/2020   Procedure: PERIPHERAL VASCULAR BALLOON ANGIOPLASTY;  Surgeon: Leonie Douglas, MD;  [facility]: [facility];  Service: Cardiovascular;  Laterality: Right;  arm fistula   PERIPHERAL VASCULAR BALLOON ANGIOPLASTY Right 07/05/2020   Procedure: PERIPHERAL VASCULAR BALLOON ANGIOPLASTY;  Surgeon: Sherren Kerns, MD;  [facility]: [facility];  Service: Cardiovascular;  Laterality: Right;  arm fistula   PORT-A-CATH REMOVAL Left 11/22/2017   Procedure: REMOVAL PORT-A-CATH LEFT CHEST;  Surgeon: Sherren Kerns, MD;  Location: [facility] OR;  Service: Vascular;  Laterality: Left;   RETINAL DETACHMENT SURGERY Right    SCLEROTHERAPY  07/23/2020   Procedure: SCLEROTHERAPY;  Surgeon: Malissa Hippo, MD;  [facility]: [facility];  Service: Endoscopy;;   UPPER EXTREMITY VENOGRAPHY N/A 08/22/2020    Procedure: LEFT UPPER & CENTRAL VENOGRAPHY;  Surgeon: Cephus Shelling, MD;  [facility]: [facility];  Service: Cardiovascular;  Laterality: N/A;    Social History   Socioeconomic History   Marital status: Single    Spouse name: Not on file   Number of children: Not on file   Years of education: Not on file   Highest education level: Not on file  Occupational History   Occupation: retired   Tobacco Use   Smoking status: Never   Smokeless tobacco: Never  Vaping Use   Vaping Use: Never used  Substance and Sexual Activity   Alcohol use: No    Alcohol/week: 0.0 standard drinks of alcohol   Drug use: No   Sexual activity: Never  Other Topics Concern   Not on file  Social History Narrative   Long term resident of SNF    Social Determinants of Health   Financial Resource Strain: Low Risk  (01/09/2020)   Overall Financial Resource Strain (CARDIA)    Difficulty of Paying Living Expenses: Not very hard  Food Insecurity: No Food Insecurity (01/09/2020)   Hunger Vital Sign  Worried About Programme researcher, broadcasting/film/video in the Last Year: Never true    Ran Out of Food in the Last Year: Never true  Transportation Needs: No Transportation Needs (01/09/2020)   PRAPARE - Administrator, Civil Service (Medical): No    Lack of Transportation (Non-Medical): No  Physical Activity: Inactive (01/09/2020)   Exercise Vital Sign    Days of Exercise per Week: 0 days    Minutes of Exercise per Session: 0 min  Stress: No Stress Concern Present (01/09/2020)   [employer] of Occupational Health - Occupational Stress Questionnaire    Feeling of Stress : Not at all  Social Connections: Moderately Isolated (01/09/2020)   Social Connection and Isolation Panel [NHANES]    Frequency of Communication with Friends and Family: More than three times a week    Frequency of Social Gatherings with Friends and Family: Once a week    Attends Religious Services: More than 4 times per year    Active  Member of [employer] or Organizations: No    Attends Banker Meetings: Never    Marital Status: Never married  Intimate Partner Violence: Not At Risk (01/09/2020)   Humiliation, Afraid, Rape, and Kick questionnaire    Fear of Current or Ex-Partner: No    Emotionally Abused: No    Physically Abused: No    Sexually Abused: No   Family History  Problem Relation Age of Onset   Multiple myeloma Sister    Brain cancer Sister    Dementia Mother        died at 54   Stroke Mother    Heart failure Mother    Diabetes Mother    Heart disease Father    Prostate cancer Brother    Colon cancer Neg Hx       VITAL SIGNS BP 139/75   Pulse 75   Ht 5\' 3"  (1.6 m)   Wt 142 lb 3.2 oz (64.5 kg)   BMI 25.19 kg/m   Outpatient Encounter Medications as of 08/20/2022  Medication Sig   acetaminophen (TYLENOL) 325 MG tablet Take 650 mg by mouth 3 (three) times daily.   acyclovir (ZOVIRAX) 200 MG capsule Take 200 mg by mouth in the morning. (0800)   anastrozole (ARIMIDEX) 1 MG tablet TAKE 1 TABLET BY MOUTH DAILY   calcium-vitamin D (OSCAL WITH D) 500-200 MG-UNIT tablet Take 1 tablet by mouth in the morning. (0800)   melatonin 3 MG TABS tablet Take 6 mg by mouth at bedtime. (2000) For Sleep   multivitamin (RENA-VIT) TABS tablet Take 1 tablet by mouth at bedtime. (2100)   Nutritional Supplements (ENSURE PLUS PO) Take 1 Can by mouth daily. 8pm   ondansetron (ZOFRAN) 4 MG tablet Take 1 tablet (4 mg total) by mouth every 6 (six) hours as needed for nausea.   ondansetron (ZOFRAN-ODT) 4 MG disintegrating tablet Take 4 mg by mouth daily.   pantoprazole (PROTONIX) 40 MG tablet Take 40 mg by mouth 2 (two) times daily.   sennosides-docusate sodium (SENOKOT-S) 8.6-50 MG tablet Take 1 tablet by mouth 2 (two) times daily.   sertraline (ZOLOFT) 50 MG tablet Take 50 mg by mouth daily.   sucralfate (CARAFATE) 1 GM/10ML suspension Take 1 g by mouth in the morning, at noon, in the evening, and at bedtime. (0800,  1200, 1700 & 2100)   UNABLE TO FIND Dysphagia 3 Diet   Insulin Pen Needle (BD AUTOSHIELD DUO) 30G X 5 MM MISC by Does not apply route.  3/16"      SIGNIFICANT DIAGNOSTIC EXAMS  PREVIOUS   10-01-21: chest x-ray: No evidence of active cardiopulmonary process.   10-01-21: abdominal ultrasound:  1. Cholelithiasis with no evidence of acute cholecystitis. 2. Increased echogenicity of the liver parenchyma, findings suggestive of hepatic steatosis.  10-01-21: ct of chest abdomen and pelvis  No aspiration pneumonia. Mild focal scarring at the medial left lung base.   Coronary artery calcification. Aortic atherosclerotic calcification. Aortic Atherosclerosis  Probably acute superior endplate fractures at T12, L1 and L2. No retropulsed bone.   Renal atrophy. Multiple renal cysts. Indeterminate 2.7 cm lesion at the lower pole the right kidney could be a complicated cyst or a mass. This could be evaluated with renal MRI. 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 Nonobstructing 5 mm stone in the lower pole of the left kidney.   1 x 2 cm cystic abnormality in the ventral head of the pancreas. Best practice recommendation is for reimaging of this every 6 months for 2 years and then every 1 year for 2 years and then every 2 years for 6 years. Follow-up can be discontinued if stable for 10 years. This recommendation could be tailored to this patient's clinical status and life expectancy.  Paget's disease of the right hemipelvis. Diverticulosis without evidence of diverticulitis. Small hiatal hernia. Multiple calcified leiomyomas the uterus.  10-24-21: DEXA: t score -2.957  NO NEW EXAMS    LABS REVIEWED PREVIOUS    08-12-21: wbc 6.3; hgb 10.0; hct 31.9; mcv 103.9 plt 215; glucose 197; bun 40;creat 6.25; k+ 4.2; na++138; ca 9.3; gfr 6; protein 6.7; albumin 3.7; iron 93; tibc 142; ferritin 312 09-30-21: wbc 6.6; hgb 9.6; hct 31.1; mcv 101.3 plt 232; glucose 222; bun 40; creat 6.78; k+ 4.0; na++ 136; ca 9.2;  gfr 6; protein 6.8 albumin 3.7  10-01-21: wbc 13.0; hgb 9.1; hct 28.8; mcv 99.3 plt 251; glucose 198; bun 59; creat 9.01; k+ 3.7; na++ 135; ca 9.6; gfr 4; ast 212; alt 173; protein 6.7; albumin 3.6  10-03-21: wbc 7.2; hgb 8.6; hct 27.5; mcv 99.6 plt 179 glucose 105; bun 34; creat 7.04; k+ 3.6; na++ 135; ca 8.8; gfr 5 ast 45; alt 99; protein 6.5; albumin 3.5  10-10-21: hgb a1c 5.3  11-25-21: wbc 4.9; hgb 9.4; hct 30.7; mcv 100.7 plt 237; glucose 171; bun 27; creat 6.88; k+ 4.4; na++ 137; ca 9.0; gfr 6; protein 6.4 albumin 3.5  12-16-21: wbc 5.3; hgb 10.1; hct 33.0 mcv 98.5 plt 256; glucose 119; bun 24; creat 6.62; k+ 4.2 na++ 136; ca 9.3 gfr 6; protein 6.4 albumin 3.5; iron 54; tibc 140; ferritin 272  02-03-22: wbc 3.8; hgb 11.5; hct 37.6; mcv 96.4 plt 212; glucose 164; bun 28; creat 7.99; k+ 4.4; na++ 139; ca 8.3 gfr 5; protein 6.7 albumin 3.7 mag 2.1 02-17-22: wbc 4.6; hgb 11.5; hct 36.5; mcv 93.6 plt 207; glucose 95; bun 23 creat 7.44 k+ 4.1; na++ 135; ca 7.8; gfr 5 protein 6.9; albumin 3.8  03-31-22: wbc 4.7; hgb 10.0; hct 32.2; mcv 96.4 plt 278; glucose 155; bun 28; creat 6.50; k+ 4.0; na++ 132; ca 8.0; gfr 6 protein 6.9 albumin 3.6 05-19-22: wbc 4.1; hgb 9.6; hct 30.9; mcv 98.7 plt 236; glucose 170; bun 33; creat 6.03; k+ 3.9; na++ 137; ca 8.1; gfr 6; protein 6.6 albumin 3.4; mag 2.1  06-23-22: wbc 4.9; hgb 9.1; hct 28.7 mcv 101.4 plt 286; glucose 158; bun 25; creat 5.85; k+ 3.9 na++ 136; ca 8.6  gfr 7 protein 6.8 albumin 3.3 mag 2.2  TODAY  07-02-22: hgb A1c 5.1 07-07-22: wbc 4.9; hgb 10.3; hct 32.8; mcv 102.5 plt 265; glucose 141; bun 32; creat 6.15; k+ 4.3; na++ 136; ca 8.9; gfr 6 protein 7.2 albumin 3.7; mag 2.3 07-20-22: vitamin B 12: 660; tsh 1.836 08-04-22: wbc 4.6; hgb 10.7; ht 33.1; mcv 100.3 plt 234; glucose 120; bun 41; creat 6.46; k+ 4.2; na++ 135; ca 9.8; gfr 6; protein 7.3; albumin 3.7 mag 2.4      Review of Systems  Constitutional:  Negative for malaise/fatigue.  Respiratory:  Negative  for cough and shortness of breath.   Cardiovascular:  Negative for chest pain, palpitations and leg swelling.  Gastrointestinal:  Negative for abdominal pain, constipation and heartburn.  Musculoskeletal:  Negative for back pain, joint pain and myalgias.  Skin: Negative.   Neurological:  Negative for dizziness.  Psychiatric/Behavioral:  The patient is not nervous/anxious.    Physical Exam Constitutional:      General: She is not in acute distress.    Appearance: She is not diaphoretic.  Eyes:     Conjunctiva/sclera: Conjunctivae normal.  Neck:     Thyroid: No thyromegaly.     Vascular: No JVD.  Cardiovascular:     Rate and Rhythm: Normal rate and regular rhythm.     Pulses: Normal pulses.  Pulmonary:     Effort: Pulmonary effort is normal. No respiratory distress.     Breath sounds: Normal breath sounds. No wheezing.  Abdominal:     General: Bowel sounds are normal. There is no distension.     Palpations: Abdomen is soft.     Tenderness: There is no abdominal tenderness.  Musculoskeletal:        General: Normal range of motion.     Cervical back: Neck supple.     Right lower leg: No edema.     Left lower leg: No edema.     Comments: Able to move all extremities   Lymphadenopathy:     Cervical: No cervical adenopathy.  Skin:    General: Skin is warm and dry.     Comments: Chest wall dialysis access    Neurological:     Mental Status: She is alert. Mental status is at baseline.  Psychiatric:        Mood and Affect: Mood normal.          ASSESSMENT/PLAN  TODAY  Generalized chronic pain: will continue tylenol 650 mg three times daily   2. Chronic kidney disease with end stage renal disease on dialysis due to type 2 diabetes mellitus/dependence on dialysis: will continue dialysis three days per week: 1200 cc fluid restriction; calcium acetate 1334 mg with meals  3. Hypertension associated with end stage renal disease (ESRD) due to type 2 diabetes mellitus: b/p 139/75  will monitor   PREVIOUS   4. Hyperlipidemia associated with type 2 diabetes mellitus: ldl 89; is off lipitor elevated liver enzymes  5. Aortic atherosclerosis / atherosclerotic peripheral vascular disease (ct 10-04-18) ldl 89  6. Vascular dementia with behavioral disturbance: weight is 142 pounds  7. Diabetes mellitus type 2 with end stage renal disease (ESRD) hgb A1c 5.1 is presently off medications.   8. Anemia due to end stage renal disease: hgb 9.1  9. Post menopausal osteoporosis: t score -2.957 will continue supplements  10. Stage 1 infiltrating ductal carcinoma of female right breast/status post right mastectomy will continue arimidex 1 mg daily  11. Multiple myeloma not having achieved  remission: is followed by oncology    13. Herpes: without outbreak will continue acyclovir 200 mg daily   14. Major depression chronic recurrent: will continue zoloft 50 mg daily  15. Protein calorie malnutrition: protein 7.3 albumin 3.7 will continue supplements and prostat with meals.   16. Multiple gastric ulcers/duodenal ulcer/upper GI bleed/esophageal reflux disease without esophagitis: will continue protonix 40 mg twice daily carafate 1 gm with before meals and bedtime. Zofran 4 mg daily and every 6 hours as needed        Synthia Innocent NP [facility]   call [phone]

## 2022-08-21 DIAGNOSIS — Z992 Dependence on renal dialysis: Secondary | ICD-10-CM | POA: Diagnosis not present

## 2022-08-21 DIAGNOSIS — G8929 Other chronic pain: Secondary | ICD-10-CM | POA: Insufficient documentation

## 2022-08-21 DIAGNOSIS — N186 End stage renal disease: Secondary | ICD-10-CM | POA: Diagnosis not present

## 2022-08-21 DIAGNOSIS — N2581 Secondary hyperparathyroidism of renal origin: Secondary | ICD-10-CM | POA: Diagnosis not present

## 2022-08-24 DIAGNOSIS — N2581 Secondary hyperparathyroidism of renal origin: Secondary | ICD-10-CM | POA: Diagnosis not present

## 2022-08-24 DIAGNOSIS — N186 End stage renal disease: Secondary | ICD-10-CM | POA: Diagnosis not present

## 2022-08-24 DIAGNOSIS — Z992 Dependence on renal dialysis: Secondary | ICD-10-CM | POA: Diagnosis not present

## 2022-08-26 DIAGNOSIS — N2581 Secondary hyperparathyroidism of renal origin: Secondary | ICD-10-CM | POA: Diagnosis not present

## 2022-08-26 DIAGNOSIS — Z992 Dependence on renal dialysis: Secondary | ICD-10-CM | POA: Diagnosis not present

## 2022-08-26 DIAGNOSIS — N186 End stage renal disease: Secondary | ICD-10-CM | POA: Diagnosis not present

## 2022-08-28 DIAGNOSIS — N186 End stage renal disease: Secondary | ICD-10-CM | POA: Diagnosis not present

## 2022-08-28 DIAGNOSIS — Z992 Dependence on renal dialysis: Secondary | ICD-10-CM | POA: Diagnosis not present

## 2022-08-28 DIAGNOSIS — N2581 Secondary hyperparathyroidism of renal origin: Secondary | ICD-10-CM | POA: Diagnosis not present

## 2022-08-31 DIAGNOSIS — N186 End stage renal disease: Secondary | ICD-10-CM | POA: Diagnosis not present

## 2022-08-31 DIAGNOSIS — N2581 Secondary hyperparathyroidism of renal origin: Secondary | ICD-10-CM | POA: Diagnosis not present

## 2022-08-31 DIAGNOSIS — Z992 Dependence on renal dialysis: Secondary | ICD-10-CM | POA: Diagnosis not present

## 2022-09-01 ENCOUNTER — Inpatient Hospital Stay: Payer: Medicare PPO

## 2022-09-01 ENCOUNTER — Other Ambulatory Visit: Payer: Self-pay

## 2022-09-01 ENCOUNTER — Inpatient Hospital Stay: Payer: Medicare PPO | Attending: Hematology

## 2022-09-01 VITALS — BP 163/74 | HR 92 | Temp 99.0°F | Resp 20 | Wt 136.6 lb

## 2022-09-01 DIAGNOSIS — C9000 Multiple myeloma not having achieved remission: Secondary | ICD-10-CM | POA: Insufficient documentation

## 2022-09-01 DIAGNOSIS — Z5112 Encounter for antineoplastic immunotherapy: Secondary | ICD-10-CM | POA: Insufficient documentation

## 2022-09-01 LAB — CBC WITH DIFFERENTIAL/PLATELET
Abs Immature Granulocytes: 0.01 10*3/uL (ref 0.00–0.07)
Basophils Absolute: 0 10*3/uL (ref 0.0–0.1)
Basophils Relative: 1 %
Eosinophils Absolute: 0.2 10*3/uL (ref 0.0–0.5)
Eosinophils Relative: 5 %
HCT: 31.7 % — ABNORMAL LOW (ref 36.0–46.0)
Hemoglobin: 10.1 g/dL — ABNORMAL LOW (ref 12.0–15.0)
Immature Granulocytes: 0 %
Lymphocytes Relative: 20 %
Lymphs Abs: 0.8 10*3/uL (ref 0.7–4.0)
MCH: 32.9 pg (ref 26.0–34.0)
MCHC: 31.9 g/dL (ref 30.0–36.0)
MCV: 103.3 fL — ABNORMAL HIGH (ref 80.0–100.0)
Monocytes Absolute: 0.5 10*3/uL (ref 0.1–1.0)
Monocytes Relative: 13 %
Neutro Abs: 2.6 10*3/uL (ref 1.7–7.7)
Neutrophils Relative %: 61 %
Platelets: 212 10*3/uL (ref 150–400)
RBC: 3.07 MIL/uL — ABNORMAL LOW (ref 3.87–5.11)
RDW: 15.7 % — ABNORMAL HIGH (ref 11.5–15.5)
WBC: 4.2 10*3/uL (ref 4.0–10.5)
nRBC: 0 % (ref 0.0–0.2)

## 2022-09-01 LAB — COMPREHENSIVE METABOLIC PANEL
ALT: 10 U/L (ref 0–44)
AST: 14 U/L — ABNORMAL LOW (ref 15–41)
Albumin: 3.7 g/dL (ref 3.5–5.0)
Alkaline Phosphatase: 52 U/L (ref 38–126)
Anion gap: 14 (ref 5–15)
BUN: 21 mg/dL (ref 8–23)
CO2: 26 mmol/L (ref 22–32)
Calcium: 9.7 mg/dL (ref 8.9–10.3)
Chloride: 96 mmol/L — ABNORMAL LOW (ref 98–111)
Creatinine, Ser: 6.53 mg/dL — ABNORMAL HIGH (ref 0.44–1.00)
GFR, Estimated: 6 mL/min — ABNORMAL LOW (ref >60)
Glucose, Bld: 105 mg/dL — ABNORMAL HIGH (ref 70–99)
Potassium: 4.1 mmol/L (ref 3.5–5.1)
Sodium: 136 mmol/L (ref 135–145)
Total Bilirubin: 0.6 mg/dL (ref 0.3–1.2)
Total Protein: 6.9 g/dL (ref 6.5–8.1)

## 2022-09-01 LAB — MAGNESIUM: Magnesium: 2.3 mg/dL (ref 1.7–2.4)

## 2022-09-01 MED ORDER — PROCHLORPERAZINE MALEATE 10 MG PO TABS
10.0000 mg | ORAL_TABLET | Freq: Once | ORAL | Status: AC
  Administered 2022-09-01: 10 mg via ORAL
  Filled 2022-09-01: qty 1

## 2022-09-01 MED ORDER — BORTEZOMIB CHEMO SQ INJECTION 3.5 MG (2.5MG/ML)
1.2000 mg/m2 | Freq: Once | INTRAMUSCULAR | Status: AC
  Administered 2022-09-01: 2 mg via SUBCUTANEOUS
  Filled 2022-09-01: qty 0.8

## 2022-09-01 NOTE — Patient Instructions (Signed)
[facility] [place]  Discharge Instructions: Thank you for choosing [facility] to provide your oncology and hematology care.  If you have a lab appointment with the Cancer Center - please note that after April 8th, 2024, all labs will be drawn in the cancer center.  You do not have to check in or register with the main entrance as you have in the past but will complete your check-in in the cancer center.  Wear comfortable clothing and clothing appropriate for easy access to any Portacath or PICC line.   We strive to give you quality time with your provider. You may need to reschedule your appointment if you arrive late (15 or more minutes).  Arriving late affects you and other patients whose appointments are after yours.  Also, if you miss three or more appointments without notifying the office, you may be dismissed from the clinic at the provider's discretion.      For prescription refill requests, have your pharmacy contact our office and allow 72 hours for refills to be completed.    Today you received the following chemotherapy and/or immunotherapy agents Velcade. Bortezomib Injection What is this medication? BORTEZOMIB (bor TEZ oh mib) treats lymphoma. It may also be used to treat multiple myeloma, a type of bone marrow cancer. It works by blocking a protein that causes cancer cells to grow and multiply. This helps to slow or stop the spread of cancer cells. This medicine may be used for other purposes; ask your health care provider or pharmacist if you have questions. COMMON BRAND NAME(S): Velcade What should I tell my care team before I take this medication? They need to know if you have any of these conditions: Dehydration Diabetes Heart disease Liver disease Tingling of the fingers or toes or other nerve disorder An unusual or allergic reaction to bortezomib, other medications, foods, dyes, or preservatives If you or your partner are pregnant or trying to  get pregnant Breastfeeding How should I use this medication? This medication is injected into a vein or under the skin. It is given by your care team in a hospital or clinic setting. Talk to your care team about the use of this medication in children. Special care may be needed. Overdosage: If you think you have taken too much of this medicine contact a poison control center or emergency room at once. NOTE: This medicine is only for you. Do not share this medicine with others. What if I miss a dose? Keep appointments for follow-up doses. It is important not to miss your dose. Call your care team if you are unable to keep an appointment. What may interact with this medication? Ketoconazole Rifampin This list may not describe all possible interactions. Give your health care provider a list of all the medicines, herbs, non-prescription drugs, or dietary supplements you use. Also tell them if you smoke, drink alcohol, or use illegal drugs. Some items may interact with your medicine. What should I watch for while using this medication? Your condition will be monitored carefully while you are receiving this medication. You may need blood work while taking this medication. This medication may affect your coordination, reaction time, or judgment. Do not drive or operate machinery until you know how this medication affects you. Sit up or stand slowly to reduce the risk of dizzy or fainting spells. Drinking alcohol with this medication can increase the risk of these side effects. This medication may increase your risk of getting an infection. Call   your care team for advice if you get a fever, chills, sore throat, or other symptoms of a cold or flu. Do not treat yourself. Try to avoid being around people who are sick. Check with your care team if you have severe diarrhea, nausea, and vomiting, or if you sweat a lot. The loss of too much body fluid may make it dangerous for you to take this medication. Talk to  your care team if you may be pregnant. Serious birth defects can occur if you take this medication during pregnancy and for 7 months after the last dose. You will need a negative pregnancy test before starting this medication. Contraception is recommended while taking this medication and for 7 months after the last dose. Your care team can help you find the option that works for you. If your partner can get pregnant, use a condom during sex while taking this medication and for 4 months after the last dose. Do not breastfeed while taking this medication and for 2 months after the last dose. This medication may cause infertility. Talk to your care team if you are concerned about your fertility. What side effects may I notice from receiving this medication? Side effects that you should report to your care team as soon as possible: Allergic reactions--skin rash, itching, hives, swelling of the face, lips, tongue, or throat Bleeding--bloody or black, tar-like stools, vomiting blood or Chanan Detwiler material that looks like coffee grounds, red or dark Janeka Libman urine, small red or purple spots on skin, unusual bruising or bleeding Bleeding in the brain--severe headache, stiff neck, confusion, dizziness, change in vision, numbness or weakness of the face, arm, or leg, trouble speaking, trouble walking, vomiting Bowel blockage--stomach cramping, unable to have a bowel movement or pass gas, loss of appetite, vomiting Heart failure--shortness of breath, swelling of the ankles, feet, or hands, sudden weight gain, unusual weakness or fatigue Infection--fever, chills, cough, sore throat, wounds that don't heal, pain or trouble when passing urine, general feeling of discomfort or being unwell Liver injury--right upper belly pain, loss of appetite, nausea, light-colored stool, dark yellow or Aulani Shipton urine, yellowing skin or eyes, unusual weakness or fatigue Low blood pressure--dizziness, feeling faint or lightheaded, blurry  vision Lung injury--shortness of breath or trouble breathing, cough, spitting up blood, chest pain, fever Pain, tingling, or numbness in the hands or feet Severe or prolonged diarrhea Stomach pain, bloody diarrhea, pale skin, unusual weakness or fatigue, decrease in the amount of urine, which may be signs of hemolytic uremic syndrome Sudden and severe headache, confusion, change in vision, seizures, which may be signs of posterior reversible encephalopathy syndrome (PRES) TTP--purple spots on the skin or inside the mouth, pale skin, yellowing skin or eyes, unusual weakness or fatigue, fever, fast or irregular heartbeat, confusion, change in vision, trouble speaking, trouble walking Tumor lysis syndrome (TLS)--nausea, vomiting, diarrhea, decrease in the amount of urine, dark urine, unusual weakness or fatigue, confusion, muscle pain or cramps, fast or irregular heartbeat, joint pain Side effects that usually do not require medical attention (report to your care team if they continue or are bothersome): Constipation Diarrhea Fatigue Loss of appetite Nausea This list may not describe all possible side effects. Call your doctor for medical advice about side effects. You may report side effects to FDA at 1-800-FDA-1088. Where should I keep my medication? This medication is given in a hospital or clinic. It will not be stored at home. NOTE: This sheet is a summary. It may not cover all possible information. If   you have questions about this medicine, talk to your doctor, pharmacist, or health care provider.  2024 Elsevier/Gold Standard (2021-07-29 00:00:00)        To help prevent nausea and vomiting after your treatment, we encourage you to take your nausea medication as directed.  BELOW ARE SYMPTOMS THAT SHOULD BE REPORTED IMMEDIATELY: *FEVER GREATER THAN 100.4 F (38 C) OR HIGHER *CHILLS OR SWEATING *NAUSEA AND VOMITING THAT IS NOT CONTROLLED WITH YOUR NAUSEA MEDICATION *UNUSUAL SHORTNESS OF  BREATH *UNUSUAL BRUISING OR BLEEDING *URINARY PROBLEMS (pain or burning when urinating, or frequent urination) *BOWEL PROBLEMS (unusual diarrhea, constipation, pain near the anus) TENDERNESS IN MOUTH AND THROAT WITH OR WITHOUT PRESENCE OF ULCERS (sore throat, sores in mouth, or a toothache) UNUSUAL RASH, SWELLING OR PAIN  UNUSUAL VAGINAL DISCHARGE OR ITCHING   Items with * indicate a potential emergency and should be followed up as soon as possible or go to the Emergency Department if any problems should occur.  Please show the CHEMOTHERAPY ALERT CARD or IMMUNOTHERAPY ALERT CARD at check-in to the Emergency Department and triage nurse.  Should you have questions after your visit or need to cancel or reschedule your appointment, please contact [facility] AT  [phone]  and follow the prompts.  Office hours are 8:00 a.m. to 4:30 p.m. Monday - Friday. Please note that voicemails left after 4:00 p.m. may not be returned until the following business day.  We are closed weekends and major holidays. You have access to a nurse at all times for urgent questions. Please call the main number to the clinic [phone] and follow the prompts.  For any non-urgent questions, you may also contact your provider using MyChart. We now offer e-Visits for anyone 18 and older to request care online for non-urgent symptoms. For details visit [email or web address].   Also download the MyChart app! Go to the app store, search "MyChart", open the app, select [place], and log in with your MyChart username and password.   

## 2022-09-01 NOTE — Progress Notes (Signed)
Patient presents today for chemotherapy injection.  Patient is in satisfactory condition with no new complaints voiced.  Vital signs are stable.  Labs reviewed and all labs are within treatment parameters.  We will proceed with treatment per MD orders.    Patient tolerated injection well with no complaints voiced.  Patient left via wheelchair with [facility] staff in stable condition.  Vital signs stable at discharge.  Follow up as scheduled.

## 2022-09-02 DIAGNOSIS — N186 End stage renal disease: Secondary | ICD-10-CM | POA: Diagnosis not present

## 2022-09-02 DIAGNOSIS — Z992 Dependence on renal dialysis: Secondary | ICD-10-CM | POA: Diagnosis not present

## 2022-09-02 DIAGNOSIS — N2581 Secondary hyperparathyroidism of renal origin: Secondary | ICD-10-CM | POA: Diagnosis not present

## 2022-09-04 DIAGNOSIS — Z992 Dependence on renal dialysis: Secondary | ICD-10-CM | POA: Diagnosis not present

## 2022-09-04 DIAGNOSIS — N186 End stage renal disease: Secondary | ICD-10-CM | POA: Diagnosis not present

## 2022-09-04 DIAGNOSIS — N2581 Secondary hyperparathyroidism of renal origin: Secondary | ICD-10-CM | POA: Diagnosis not present

## 2022-09-07 DIAGNOSIS — N2581 Secondary hyperparathyroidism of renal origin: Secondary | ICD-10-CM | POA: Diagnosis not present

## 2022-09-07 DIAGNOSIS — N186 End stage renal disease: Secondary | ICD-10-CM | POA: Diagnosis not present

## 2022-09-07 DIAGNOSIS — Z992 Dependence on renal dialysis: Secondary | ICD-10-CM | POA: Diagnosis not present

## 2022-09-09 DIAGNOSIS — N186 End stage renal disease: Secondary | ICD-10-CM | POA: Diagnosis not present

## 2022-09-09 DIAGNOSIS — Z992 Dependence on renal dialysis: Secondary | ICD-10-CM | POA: Diagnosis not present

## 2022-09-09 DIAGNOSIS — N2581 Secondary hyperparathyroidism of renal origin: Secondary | ICD-10-CM | POA: Diagnosis not present

## 2022-09-11 DIAGNOSIS — N186 End stage renal disease: Secondary | ICD-10-CM | POA: Diagnosis not present

## 2022-09-11 DIAGNOSIS — Z992 Dependence on renal dialysis: Secondary | ICD-10-CM | POA: Diagnosis not present

## 2022-09-11 DIAGNOSIS — N2581 Secondary hyperparathyroidism of renal origin: Secondary | ICD-10-CM | POA: Diagnosis not present

## 2022-09-14 ENCOUNTER — Other Ambulatory Visit: Payer: Self-pay

## 2022-09-14 DIAGNOSIS — Z992 Dependence on renal dialysis: Secondary | ICD-10-CM | POA: Diagnosis not present

## 2022-09-14 DIAGNOSIS — N2581 Secondary hyperparathyroidism of renal origin: Secondary | ICD-10-CM | POA: Diagnosis not present

## 2022-09-14 DIAGNOSIS — N186 End stage renal disease: Secondary | ICD-10-CM | POA: Diagnosis not present

## 2022-09-14 DIAGNOSIS — C9000 Multiple myeloma not having achieved remission: Secondary | ICD-10-CM

## 2022-09-15 ENCOUNTER — Inpatient Hospital Stay: Payer: Medicare PPO | Attending: Hematology

## 2022-09-15 ENCOUNTER — Inpatient Hospital Stay: Payer: Medicare PPO

## 2022-09-15 VITALS — BP 136/70 | HR 92 | Temp 98.2°F | Resp 20 | Wt 132.3 lb

## 2022-09-15 DIAGNOSIS — Z5112 Encounter for antineoplastic immunotherapy: Secondary | ICD-10-CM | POA: Insufficient documentation

## 2022-09-15 DIAGNOSIS — C9000 Multiple myeloma not having achieved remission: Secondary | ICD-10-CM | POA: Diagnosis not present

## 2022-09-15 LAB — CBC WITH DIFFERENTIAL/PLATELET
Abs Immature Granulocytes: 0.01 10*3/uL (ref 0.00–0.07)
Basophils Absolute: 0 10*3/uL (ref 0.0–0.1)
Basophils Relative: 1 %
Eosinophils Absolute: 0.2 10*3/uL (ref 0.0–0.5)
Eosinophils Relative: 4 %
HCT: 31 % — ABNORMAL LOW (ref 36.0–46.0)
Hemoglobin: 9.9 g/dL — ABNORMAL LOW (ref 12.0–15.0)
Immature Granulocytes: 0 %
Lymphocytes Relative: 17 %
Lymphs Abs: 0.9 10*3/uL (ref 0.7–4.0)
MCH: 33 pg (ref 26.0–34.0)
MCHC: 31.9 g/dL (ref 30.0–36.0)
MCV: 103.3 fL — ABNORMAL HIGH (ref 80.0–100.0)
Monocytes Absolute: 0.6 10*3/uL (ref 0.1–1.0)
Monocytes Relative: 11 %
Neutro Abs: 3.5 10*3/uL (ref 1.7–7.7)
Neutrophils Relative %: 67 %
Platelets: 224 10*3/uL (ref 150–400)
RBC: 3 MIL/uL — ABNORMAL LOW (ref 3.87–5.11)
RDW: 15.9 % — ABNORMAL HIGH (ref 11.5–15.5)
WBC: 5.2 10*3/uL (ref 4.0–10.5)
nRBC: 0 % (ref 0.0–0.2)

## 2022-09-15 LAB — COMPREHENSIVE METABOLIC PANEL
ALT: 12 U/L (ref 0–44)
AST: 18 U/L (ref 15–41)
Albumin: 3.7 g/dL (ref 3.5–5.0)
Alkaline Phosphatase: 54 U/L (ref 38–126)
Anion gap: 14 (ref 5–15)
BUN: 22 mg/dL (ref 8–23)
CO2: 25 mmol/L (ref 22–32)
Calcium: 9.5 mg/dL (ref 8.9–10.3)
Chloride: 96 mmol/L — ABNORMAL LOW (ref 98–111)
Creatinine, Ser: 6.19 mg/dL — ABNORMAL HIGH (ref 0.44–1.00)
GFR, Estimated: 6 mL/min — ABNORMAL LOW (ref >60)
Glucose, Bld: 132 mg/dL — ABNORMAL HIGH (ref 70–99)
Potassium: 4.2 mmol/L (ref 3.5–5.1)
Sodium: 135 mmol/L (ref 135–145)
Total Bilirubin: 0.4 mg/dL (ref 0.3–1.2)
Total Protein: 6.9 g/dL (ref 6.5–8.1)

## 2022-09-15 LAB — MAGNESIUM: Magnesium: 2.4 mg/dL (ref 1.7–2.4)

## 2022-09-15 MED ORDER — BORTEZOMIB CHEMO SQ INJECTION 3.5 MG (2.5MG/ML)
1.2000 mg/m2 | Freq: Once | INTRAMUSCULAR | Status: AC
  Administered 2022-09-15: 2 mg via SUBCUTANEOUS
  Filled 2022-09-15: qty 0.8

## 2022-09-15 MED ORDER — PROCHLORPERAZINE MALEATE 10 MG PO TABS
10.0000 mg | ORAL_TABLET | Freq: Once | ORAL | Status: AC
  Administered 2022-09-15: 10 mg via ORAL
  Filled 2022-09-15: qty 1

## 2022-09-15 NOTE — Progress Notes (Signed)
Patient tolerated Velcade injection with no complaints voiced.  Lab work reviewed.  See MAR for details.  Injection site clean and dry with no bruising or swelling noted.  Patient stable during and after injection.  Band aid applied.  VSS.  Patient left in satisfactory condition with no s/s of distress noted.   

## 2022-09-15 NOTE — Patient Instructions (Signed)
[facility] [place]  Discharge Instructions: Thank you for choosing [facility] to provide your oncology and hematology care.  If you have a lab appointment with the Cancer Center - please note that after April 8th, 2024, all labs will be drawn in the cancer center.  You do not have to check in or register with the main entrance as you have in the past but will complete your check-in in the cancer center.  Wear comfortable clothing and clothing appropriate for easy access to any Portacath or PICC line.   We strive to give you quality time with your provider. You may need to reschedule your appointment if you arrive late (15 or more minutes).  Arriving late affects you and other patients whose appointments are after yours.  Also, if you miss three or more appointments without notifying the office, you may be dismissed from the clinic at the provider's discretion.      For prescription refill requests, have your pharmacy contact our office and allow 72 hours for refills to be completed.    Today you received the following chemotherapy and/or immunotherapy agents velcade    To help prevent nausea and vomiting after your treatment, we encourage you to take your nausea medication as directed.  BELOW ARE SYMPTOMS THAT SHOULD BE REPORTED IMMEDIATELY: *FEVER GREATER THAN 100.4 F (38 C) OR HIGHER *CHILLS OR SWEATING *NAUSEA AND VOMITING THAT IS NOT CONTROLLED WITH YOUR NAUSEA MEDICATION *UNUSUAL SHORTNESS OF BREATH *UNUSUAL BRUISING OR BLEEDING *URINARY PROBLEMS (pain or burning when urinating, or frequent urination) *BOWEL PROBLEMS (unusual diarrhea, constipation, pain near the anus) TENDERNESS IN MOUTH AND THROAT WITH OR WITHOUT PRESENCE OF ULCERS (sore throat, sores in mouth, or a toothache) UNUSUAL RASH, SWELLING OR PAIN  UNUSUAL VAGINAL DISCHARGE OR ITCHING   Items with * indicate a potential emergency and should be followed up as soon as possible or go to the  Emergency Department if any problems should occur.  Please show the CHEMOTHERAPY ALERT CARD or IMMUNOTHERAPY ALERT CARD at check-in to the Emergency Department and triage nurse.  Should you have questions after your visit or need to cancel or reschedule your appointment, please contact [facility] [place] [phone]  and follow the prompts.  Office hours are 8:00 a.m. to 4:30 p.m. Monday - Friday. Please note that voicemails left after 4:00 p.m. may not be returned until the following business day.  We are closed weekends and major holidays. You have access to a nurse at all times for urgent questions. Please call the main number to the clinic [phone] and follow the prompts.  For any non-urgent questions, you may also contact your provider using MyChart. We now offer e-Visits for anyone 18 and older to request care online for non-urgent symptoms. For details visit [email or web address].   Also download the MyChart app! Go to the app store, search "MyChart", open the app, select [place], and log in with your MyChart username and password.   

## 2022-09-15 NOTE — Progress Notes (Signed)
Patient presents to clinic today for velcade injection. Patient denies any issues at this time. Will proceed as planned. Labs reviewed.  Creatinine is 6.9, this is her normal . Ok to treat per MD.

## 2022-09-16 DIAGNOSIS — N186 End stage renal disease: Secondary | ICD-10-CM | POA: Diagnosis not present

## 2022-09-16 DIAGNOSIS — N2581 Secondary hyperparathyroidism of renal origin: Secondary | ICD-10-CM | POA: Diagnosis not present

## 2022-09-16 DIAGNOSIS — Z992 Dependence on renal dialysis: Secondary | ICD-10-CM | POA: Diagnosis not present

## 2022-09-16 LAB — KAPPA/LAMBDA LIGHT CHAINS
Kappa free light chain: 177.4 mg/L — ABNORMAL HIGH (ref 3.3–19.4)
Kappa, lambda light chain ratio: 0.39 (ref 0.26–1.65)
Lambda free light chains: 449.6 mg/L — ABNORMAL HIGH (ref 5.7–26.3)

## 2022-09-18 DIAGNOSIS — N186 End stage renal disease: Secondary | ICD-10-CM | POA: Diagnosis not present

## 2022-09-18 DIAGNOSIS — N2581 Secondary hyperparathyroidism of renal origin: Secondary | ICD-10-CM | POA: Diagnosis not present

## 2022-09-18 DIAGNOSIS — Z992 Dependence on renal dialysis: Secondary | ICD-10-CM | POA: Diagnosis not present

## 2022-09-18 LAB — PROTEIN ELECTROPHORESIS, SERUM
A/G Ratio: 1.4 (ref 0.7–1.7)
Albumin ELP: 3.8 g/dL (ref 2.9–4.4)
Alpha-1-Globulin: 0.3 g/dL (ref 0.0–0.4)
Alpha-2-Globulin: 0.7 g/dL (ref 0.4–1.0)
Beta Globulin: 1 g/dL (ref 0.7–1.3)
Gamma Globulin: 0.7 g/dL (ref 0.4–1.8)
Globulin, Total: 2.8 g/dL (ref 2.2–3.9)
M-Spike, %: 0.3 g/dL — ABNORMAL HIGH
Total Protein ELP: 6.6 g/dL (ref 6.0–8.5)

## 2022-09-18 LAB — IMMUNOFIXATION ELECTROPHORESIS
IgA: 327 mg/dL (ref 64–422)
IgG (Immunoglobin G), Serum: 734 mg/dL (ref 586–1602)
IgM (Immunoglobulin M), Srm: 10 mg/dL — ABNORMAL LOW (ref 26–217)
Total Protein ELP: 6.6 g/dL (ref 6.0–8.5)

## 2022-09-19 ENCOUNTER — Other Ambulatory Visit: Payer: Self-pay

## 2022-09-21 DIAGNOSIS — N186 End stage renal disease: Secondary | ICD-10-CM | POA: Diagnosis not present

## 2022-09-21 DIAGNOSIS — Z992 Dependence on renal dialysis: Secondary | ICD-10-CM | POA: Diagnosis not present

## 2022-09-21 DIAGNOSIS — N2581 Secondary hyperparathyroidism of renal origin: Secondary | ICD-10-CM | POA: Diagnosis not present

## 2022-09-23 DIAGNOSIS — N186 End stage renal disease: Secondary | ICD-10-CM | POA: Diagnosis not present

## 2022-09-23 DIAGNOSIS — N2581 Secondary hyperparathyroidism of renal origin: Secondary | ICD-10-CM | POA: Diagnosis not present

## 2022-09-23 DIAGNOSIS — Z992 Dependence on renal dialysis: Secondary | ICD-10-CM | POA: Diagnosis not present

## 2022-09-25 ENCOUNTER — Non-Acute Institutional Stay (SKILLED_NURSING_FACILITY): Payer: Medicare PPO | Admitting: Adult Health

## 2022-09-25 DIAGNOSIS — Z992 Dependence on renal dialysis: Secondary | ICD-10-CM

## 2022-09-25 DIAGNOSIS — E1122 Type 2 diabetes mellitus with diabetic chronic kidney disease: Secondary | ICD-10-CM | POA: Diagnosis not present

## 2022-09-25 DIAGNOSIS — I700 Atherosclerosis of aorta: Secondary | ICD-10-CM

## 2022-09-25 DIAGNOSIS — N2581 Secondary hyperparathyroidism of renal origin: Secondary | ICD-10-CM | POA: Diagnosis not present

## 2022-09-25 DIAGNOSIS — N186 End stage renal disease: Secondary | ICD-10-CM

## 2022-09-25 NOTE — Progress Notes (Signed)
Location:  [facility] Room Number: 148 Place of Service:  SNF (31)   CODE STATUS: dnr   Allergies  Allergen Reactions   Ace Inhibitors Other (See Comments) and Cough    Tongue swell , ie angioedema   Angiotensin Receptor Blockers     Angioedema with ACE-I   Penicillins Other (See Comments)    Unsure of reaction Has patient had a PCN reaction causing immediate rash, facial/tongue/throat swelling, SOB or lightheadedness with hypotension: Unknown Has patient had a PCN reaction causing severe rash involving mucus membranes or skin necrosis: Unknown Has patient had a PCN reaction that required hospitalization: No Has patient had a PCN reaction occurring within the last 10 years: Unknown If all of the above answers are "NO", then may proceed with Cephalosporin use.     Penicillin G     Chief Complaint  Patient presents with   Acute Visit    Care plan meeting    HPI:  We have come together for her care plan meeting. Family present. BIMS 11/15 mood 6/30: decreased energy nervous at times.  She is nonambulatory without falls. She requires moderate assist with her adls. She is frequently incontinent of bladder and bowel. Dietary: weight 145 pounds; D3 diet 1.2 liter fluid restriction; 50-100% of meals. Her sertraline has been decreased to 25 mg daily. Therapy: none at this time. Activities: socializing food religious activities. She will continue to be followed for her chronic illnesses including: Aortic atherosclerosis     Chronic kidney disease with end stage renal disease on dialysis due to type 2 diabetes mellitus  Dependence on dialysis  Past Medical History:  Diagnosis Date   Anemia     chronic macrocytic anemia   Anxiety    Chronic kidney disease    Chronic renal disease, stage 4, severely decreased glomerular filtration rate (GFR) between 15-29 mL/min/1.73 square meter (HCC) 08/22/2015   Complication of anesthesia    delirious after Breast Surgery    Dementia (HCC)    mild   Depression    Diabetes mellitus with ESRD (end-stage renal disease) (HCC)    type II   Dysphagia    Dyspnea    with activity   GERD (gastroesophageal reflux disease)    Glaucoma    Hyperlipidemia    Hypertension    Multiple myeloma (HCC)    Pneumonia    Stage 1 infiltrating ductal carcinoma of right female breast (HCC) 08/21/2015   ER+ PR+ HER 2 neu + (3+) T1cN0     Past Surgical History:  Procedure Laterality Date   A/V FISTULAGRAM Right 07/05/2020   Procedure: A/V Fistulagram;  Surgeon: Sherren Kerns, MD;  [facility];  Service: Cardiovascular;  Laterality: Right;   AV FISTULA PLACEMENT Left 11/22/2017   Procedure: ARTERIOVENOUS (AV) FISTULA CREATION LEFT ARM;  Surgeon: Sherren Kerns, MD;  Location: [facility] OR;  Service: Vascular;  Laterality: Left;   AV FISTULA PLACEMENT Right 04/04/2020   Procedure: RIGHT ARM ARTERIOVENOUS FISTULA CREATION;  Surgeon: Larina Earthly, MD;  Location: [facility];  Service: Vascular;  Laterality: Right;   AV FISTULA PLACEMENT Right 08/20/2020   Procedure: ARTERIOVENOUS (AV) FISTULA LIGATION RIGHT ARM;  Surgeon: Sherren Kerns, MD;  Location: [facility] OR;  Service: Vascular;  Laterality: Right;   BIOPSY  08/07/2016   Procedure: BIOPSY;  Surgeon: Corbin Ade, MD;  [facility]: [facility];  Service: Endoscopy;;  gastric ulcer biopsy   COLONOSCOPY  ESOPHAGOGASTRODUODENOSCOPY N/A 08/07/2016   LA Grade A esophagitis s/p dilation, small hiatal hernia, multiple gastric ulcers and erosions, duodenal erosions s/p biopsy. Negative H.pylori    ESOPHAGOGASTRODUODENOSCOPY N/A 11/27/2016   normal esophagus, previously noted gastric ulcers completely healed, normal duodenum.    ESOPHAGOGASTRODUODENOSCOPY (EGD) WITH PROPOFOL N/A 07/23/2020   Procedure: ESOPHAGOGASTRODUODENOSCOPY (EGD) WITH PROPOFOL;  Surgeon: Malissa Hippo, MD;  [facility]: [facility];  Service: Endoscopy;  Laterality: N/A;   FISTULA  SUPERFICIALIZATION Left 02/14/2018   Procedure: FISTULA SUPERFICIALIZATION LEFT ARM;  Surgeon: Chuck Hint, MD;  Location: [facility] OR;  Service: Vascular;  Laterality: Left;   FRACTURE SURGERY Right    ankle   HOT HEMOSTASIS  07/23/2020   Procedure: HOT HEMOSTASIS (ARGON PLASMA COAGULATION/BICAP);  Surgeon: Malissa Hippo, MD;  [facility]: [facility];  Service: Endoscopy;;   INTRAMEDULLARY (IM) NAIL INTERTROCHANTERIC Right 07/12/2020   Procedure: INTRAMEDULLARY (IM) NAIL INTERTROCHANTRIC;  Surgeon: Oliver Barre, MD;  Location: [facility];  Service: Orthopedics;  Laterality: Right;   MALONEY DILATION N/A 08/07/2016   Procedure: Elease Hashimoto DILATION;  Surgeon: Corbin Ade, MD;  [facility]: [facility];  Service: Endoscopy;  Laterality: N/A;   MASTECTOMY, PARTIAL Right    PERIPHERAL VASCULAR BALLOON ANGIOPLASTY Left 07/13/2019   Procedure: PERIPHERAL VASCULAR BALLOON ANGIOPLASTY;  Surgeon: Cephus Shelling, MD;  [facility]: [facility];  Service: Cardiovascular;  Laterality: Left;  arm fistulogram   PERIPHERAL VASCULAR BALLOON ANGIOPLASTY Right 05/22/2020   Procedure: PERIPHERAL VASCULAR BALLOON ANGIOPLASTY;  Surgeon: Leonie Douglas, MD;  [facility]: [facility];  Service: Cardiovascular;  Laterality: Right;  arm fistula   PERIPHERAL VASCULAR BALLOON ANGIOPLASTY Right 07/05/2020   Procedure: PERIPHERAL VASCULAR BALLOON ANGIOPLASTY;  Surgeon: Sherren Kerns, MD;  [facility]: [facility];  Service: Cardiovascular;  Laterality: Right;  arm fistula   PORT-A-CATH REMOVAL Left 11/22/2017   Procedure: REMOVAL PORT-A-CATH LEFT CHEST;  Surgeon: Sherren Kerns, MD;  Location: [facility] OR;  Service: Vascular;  Laterality: Left;   RETINAL DETACHMENT SURGERY Right    SCLEROTHERAPY  07/23/2020   Procedure: SCLEROTHERAPY;  Surgeon: Malissa Hippo, MD;  [facility]: [facility];  Service: Endoscopy;;   UPPER EXTREMITY VENOGRAPHY N/A 08/22/2020   Procedure: LEFT UPPER & CENTRAL  VENOGRAPHY;  Surgeon: Cephus Shelling, MD;  [facility]: [facility];  Service: Cardiovascular;  Laterality: N/A;    Social History   Socioeconomic History   Marital status: Single    Spouse name: Not on file   Number of children: Not on file   Years of education: Not on file   Highest education level: Not on file  Occupational History   Occupation: retired   Tobacco Use   Smoking status: Never   Smokeless tobacco: Never  Vaping Use   Vaping status: Never Used  Substance and Sexual Activity   Alcohol use: No    Alcohol/week: 0.0 standard drinks of alcohol   Drug use: No   Sexual activity: Never  Other Topics Concern   Not on file  Social History Narrative   Long term resident of SNF    Social Determinants of Health   Financial Resource Strain: Low Risk  (01/09/2020)   Overall Financial Resource Strain (CARDIA)    Difficulty of Paying Living Expenses: Not very hard  Food Insecurity: No Food Insecurity (01/09/2020)   Hunger Vital Sign    Worried About Running Out of Food in the Last Year: Never true    Ran Out  of Food in the Last Year: Never true  Transportation Needs: No Transportation Needs (01/09/2020)   PRAPARE - Administrator, Civil Service (Medical): No    Lack of Transportation (Non-Medical): No  Physical Activity: Inactive (01/09/2020)   Exercise Vital Sign    Days of Exercise per Week: 0 days    Minutes of Exercise per Session: 0 min  Stress: No Stress Concern Present (01/09/2020)   [employer] of Occupational Health - Occupational Stress Questionnaire    Feeling of Stress : Not at all  Social Connections: Moderately Isolated (01/09/2020)   Social Connection and Isolation Panel [NHANES]    Frequency of Communication with Friends and Family: More than three times a week    Frequency of Social Gatherings with Friends and Family: Once a week    Attends Religious Services: More than 4 times per year    Active Member of [employer] or  Organizations: No    Attends Banker Meetings: Never    Marital Status: Never married  Intimate Partner Violence: Not At Risk (01/09/2020)   Humiliation, Afraid, Rape, and Kick questionnaire    Fear of Current or Ex-Partner: No    Emotionally Abused: No    Physically Abused: No    Sexually Abused: No   Family History  Problem Relation Age of Onset   Multiple myeloma Sister    Brain cancer Sister    Dementia Mother        died at 40   Stroke Mother    Heart failure Mother    Diabetes Mother    Heart disease Father    Prostate cancer Brother    Colon cancer Neg Hx       VITAL SIGNS There were no vitals taken for this visit.  Outpatient Encounter Medications as of 09/25/2022  Medication Sig   acetaminophen (TYLENOL) 325 MG tablet Take 650 mg by mouth 3 (three) times daily.   acyclovir (ZOVIRAX) 200 MG capsule Take 200 mg by mouth in the morning. (0800)   anastrozole (ARIMIDEX) 1 MG tablet TAKE 1 TABLET BY MOUTH DAILY   calcium-vitamin D (OSCAL WITH D) 500-200 MG-UNIT tablet Take 1 tablet by mouth in the morning. (0800)   Insulin Pen Needle (BD AUTOSHIELD DUO) 30G X 5 MM MISC by Does not apply route. 3/16"   melatonin 3 MG TABS tablet Take 6 mg by mouth at bedtime. (2000) For Sleep   multivitamin (RENA-VIT) TABS tablet Take 1 tablet by mouth at bedtime. (2100)   Nutritional Supplements (ENSURE PLUS PO) Take 1 Can by mouth daily. 8pm   ondansetron (ZOFRAN) 4 MG tablet Take 1 tablet (4 mg total) by mouth every 6 (six) hours as needed for nausea.   ondansetron (ZOFRAN-ODT) 4 MG disintegrating tablet Take 4 mg by mouth daily.   pantoprazole (PROTONIX) 40 MG tablet Take 40 mg by mouth 2 (two) times daily.   sennosides-docusate sodium (SENOKOT-S) 8.6-50 MG tablet Take 1 tablet by mouth 2 (two) times daily.   sertraline (ZOLOFT) 50 MG tablet Take 50 mg by mouth daily.   sucralfate (CARAFATE) 1 GM/10ML suspension Take 1 g by mouth in the morning, at noon, in the evening,  and at bedtime. (0800, 1200, 1700 & 2100)   UNABLE TO FIND Dysphagia 3 Diet   Facility-Administered Encounter Medications as of 09/25/2022  Medication   lanreotide acetate (SOMATULINE DEPOT) 120 MG/0.5ML injection   lanreotide acetate (SOMATULINE DEPOT) 120 MG/0.5ML injection   lanreotide acetate (SOMATULINE DEPOT) 120 MG/0.5ML  injection   lanreotide acetate (SOMATULINE DEPOT) 120 MG/0.5ML injection   octreotide (SANDOSTATIN LAR) 30 MG IM injection   octreotide (SANDOSTATIN LAR) 30 MG IM injection     SIGNIFICANT DIAGNOSTIC EXAMS  PREVIOUS   10-01-21: chest x-ray: No evidence of active cardiopulmonary process.   10-01-21: abdominal ultrasound:  1. Cholelithiasis with no evidence of acute cholecystitis. 2. Increased echogenicity of the liver parenchyma, findings suggestive of hepatic steatosis.  10-01-21: ct of chest abdomen and pelvis  No aspiration pneumonia. Mild focal scarring at the medial left lung base.   Coronary artery calcification. Aortic atherosclerotic calcification. Aortic Atherosclerosis  Probably acute superior endplate fractures at T12, L1 and L2. No retropulsed bone.   Renal atrophy. Multiple renal cysts. Indeterminate 2.7 cm lesion at the lower pole the right kidney could be a complicated cyst or a mass. This could be evaluated with renal MRI. 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 Nonobstructing 5 mm stone in the lower pole of the left kidney.   1 x 2 cm cystic abnormality in the ventral head of the pancreas. Best practice recommendation is for reimaging of this every 6 months for 2 years and then every 1 year for 2 years and then every 2 years for 6 years. Follow-up can be discontinued if stable for 10 years. This recommendation could be tailored to this patient's clinical status and life expectancy.  Paget's disease of the right hemipelvis. Diverticulosis without evidence of diverticulitis. Small hiatal hernia. Multiple calcified leiomyomas the  uterus.  10-24-21: DEXA: t score -2.957  NO NEW EXAMS    LABS REVIEWED PREVIOUS    09-30-21: wbc 6.6; hgb 9.6; hct 31.1; mcv 101.3 plt 232; glucose 222; bun 40; creat 6.78; k+ 4.0; na++ 136; ca 9.2; gfr 6; protein 6.8 albumin 3.7  10-01-21: wbc 13.0; hgb 9.1; hct 28.8; mcv 99.3 plt 251; glucose 198; bun 59; creat 9.01; k+ 3.7; na++ 135; ca 9.6; gfr 4; ast 212; alt 173; protein 6.7; albumin 3.6  10-03-21: wbc 7.2; hgb 8.6; hct 27.5; mcv 99.6 plt 179 glucose 105; bun 34; creat 7.04; k+ 3.6; na++ 135; ca 8.8; gfr 5 ast 45; alt 99; protein 6.5; albumin 3.5  10-10-21: hgb a1c 5.3  11-25-21: wbc 4.9; hgb 9.4; hct 30.7; mcv 100.7 plt 237; glucose 171; bun 27; creat 6.88; k+ 4.4; na++ 137; ca 9.0; gfr 6; protein 6.4 albumin 3.5  12-16-21: wbc 5.3; hgb 10.1; hct 33.0 mcv 98.5 plt 256; glucose 119; bun 24; creat 6.62; k+ 4.2 na++ 136; ca 9.3 gfr 6; protein 6.4 albumin 3.5; iron 54; tibc 140; ferritin 272  02-03-22: wbc 3.8; hgb 11.5; hct 37.6; mcv 96.4 plt 212; glucose 164; bun 28; creat 7.99; k+ 4.4; na++ 139; ca 8.3 gfr 5; protein 6.7 albumin 3.7 mag 2.1 02-17-22: wbc 4.6; hgb 11.5; hct 36.5; mcv 93.6 plt 207; glucose 95; bun 23 creat 7.44 k+ 4.1; na++ 135; ca 7.8; gfr 5 protein 6.9; albumin 3.8  03-31-22: wbc 4.7; hgb 10.0; hct 32.2; mcv 96.4 plt 278; glucose 155; bun 28; creat 6.50; k+ 4.0; na++ 132; ca 8.0; gfr 6 protein 6.9 albumin 3.6 05-19-22: wbc 4.1; hgb 9.6; hct 30.9; mcv 98.7 plt 236; glucose 170; bun 33; creat 6.03; k+ 3.9; na++ 137; ca 8.1; gfr 6; protein 6.6 albumin 3.4; mag 2.1  06-23-22: wbc 4.9; hgb 9.1; hct 28.7 mcv 101.4 plt 286; glucose 158; bun 25; creat 5.85; k+ 3.9 na++ 136; ca 8.6 gfr 7 protein 6.8 albumin 3.3 mag  2.2 07-02-22: hgb A1c 5.1 07-07-22: wbc 4.9; hgb 10.3; hct 32.8; mcv 102.5 plt 265; glucose 141; bun 32; creat 6.15; k+ 4.3; na++ 136; ca 8.9; gfr 6 protein 7.2 albumin 3.7; mag 2.3 07-20-22: vitamin B 12: 660; tsh 1.836 08-04-22: wbc 4.6; hgb 10.7; ht 33.1; mcv 100.3 plt 234;  glucose 120; bun 41; creat 6.46; k+ 4.2; na++ 135; ca 9.8; gfr 6; protein 7.3; albumin 3.7 mag 2.4   NO NEW LABS.   Review of Systems  Constitutional:  Negative for malaise/fatigue.  Respiratory:  Negative for cough and shortness of breath.   Cardiovascular:  Negative for chest pain, palpitations and leg swelling.  Gastrointestinal:  Negative for abdominal pain, constipation and heartburn.  Musculoskeletal:  Negative for back pain, joint pain and myalgias.  Skin: Negative.   Neurological:  Negative for dizziness.  Psychiatric/Behavioral:  The patient is not nervous/anxious.    Physical Exam Constitutional:      General: She is not in acute distress.    Appearance: She is well-developed. She is not diaphoretic.  Neck:     Thyroid: No thyromegaly.  Cardiovascular:     Rate and Rhythm: Normal rate and regular rhythm.     Pulses: Normal pulses.     Heart sounds: Normal heart sounds.  Pulmonary:     Effort: Pulmonary effort is normal. No respiratory distress.     Breath sounds: Normal breath sounds.  Abdominal:     General: Bowel sounds are normal. There is no distension.     Palpations: Abdomen is soft.     Tenderness: There is no abdominal tenderness.  Musculoskeletal:     Cervical back: Neck supple.     Right lower leg: No edema.     Left lower leg: No edema.     Comments: Able to move all extremities   Lymphadenopathy:     Cervical: No cervical adenopathy.  Skin:    General: Skin is warm and dry.     Comments: Chest wall dialysis access     Neurological:     Mental Status: She is alert. Mental status is at baseline.  Psychiatric:        Mood and Affect: Mood normal.        ASSESSMENT/ PLAN:  TODAY  Aortic atherosclerosis Chronic kidney disease with end stage renal disease on dialysis due to type 2 diabetes mellitus Dependence on dialysis  Will continue current medications Will continue current plan of care Will continue to monitor her status.   Time spent  with patient: 35 minutes: medications; overall health; dietary.    Synthia Innocent NP [facility]  call [phone]

## 2022-09-28 ENCOUNTER — Other Ambulatory Visit: Payer: Self-pay

## 2022-09-28 ENCOUNTER — Encounter: Payer: Self-pay | Admitting: Adult Health

## 2022-09-28 ENCOUNTER — Non-Acute Institutional Stay (SKILLED_NURSING_FACILITY): Payer: Medicare PPO | Admitting: Adult Health

## 2022-09-28 DIAGNOSIS — I70209 Unspecified atherosclerosis of native arteries of extremities, unspecified extremity: Secondary | ICD-10-CM

## 2022-09-28 DIAGNOSIS — E785 Hyperlipidemia, unspecified: Secondary | ICD-10-CM

## 2022-09-28 DIAGNOSIS — Z992 Dependence on renal dialysis: Secondary | ICD-10-CM | POA: Diagnosis not present

## 2022-09-28 DIAGNOSIS — E1169 Type 2 diabetes mellitus with other specified complication: Secondary | ICD-10-CM

## 2022-09-28 DIAGNOSIS — N186 End stage renal disease: Secondary | ICD-10-CM | POA: Diagnosis not present

## 2022-09-28 DIAGNOSIS — N2581 Secondary hyperparathyroidism of renal origin: Secondary | ICD-10-CM | POA: Diagnosis not present

## 2022-09-28 DIAGNOSIS — F01518 Vascular dementia, unspecified severity, with other behavioral disturbance: Secondary | ICD-10-CM | POA: Diagnosis not present

## 2022-09-28 DIAGNOSIS — I700 Atherosclerosis of aorta: Secondary | ICD-10-CM | POA: Diagnosis not present

## 2022-09-28 NOTE — Progress Notes (Unsigned)
Location:  [facility] Room Number: 148 Place of Service:  SNF (31)   CODE STATUS: dnr   Allergies  Allergen Reactions   Ace Inhibitors Other (See Comments) and Cough    Tongue swell , ie angioedema   Angiotensin Receptor Blockers     Angioedema with ACE-I   Penicillins Other (See Comments)    Unsure of reaction Has patient had a PCN reaction causing immediate rash, facial/tongue/throat swelling, SOB or lightheadedness with hypotension: Unknown Has patient had a PCN reaction causing severe rash involving mucus membranes or skin necrosis: Unknown Has patient had a PCN reaction that required hospitalization: No Has patient had a PCN reaction occurring within the last 10 years: Unknown If all of the above answers are "NO", then may proceed with Cephalosporin use.     Penicillin G     Chief Complaint  Patient presents with   Medical Management of Chronic Issues              Hyperlipidemia associated with type 2 diabetes mellitus:    Aortic atherosclerosis/atherosclerotic peripheral vascular disease Vascular dementia with behavioral disturbance     HPI:  She is  87 year old long term resident of this facility being seen for the management of her chronic illnesses:  Hyperlipidemia associated with type 2 diabetes mellitus:    Aortic atherosclerosis/atherosclerotic peripheral vascular disease Vascular dementia with behavioral disturbance. There are no reports of pain present. Her weight is stable. She continues with hemodialysis three days per week.   Past Medical History:  Diagnosis Date   Anemia     chronic macrocytic anemia   Anxiety    Chronic kidney disease    Chronic renal disease, stage 4, severely decreased glomerular filtration rate (GFR) between 15-29 mL/min/1.73 square meter (HCC) 08/22/2015   Complication of anesthesia    delirious after Breast Surgery   Dementia (HCC)    mild   Depression    Diabetes mellitus with ESRD (end-stage renal  disease) (HCC)    type II   Dysphagia    Dyspnea    with activity   GERD (gastroesophageal reflux disease)    Glaucoma    Hyperlipidemia    Hypertension    Multiple myeloma (HCC)    Pneumonia    Stage 1 infiltrating ductal carcinoma of right female breast (HCC) 08/21/2015   ER+ PR+ HER 2 neu + (3+) T1cN0     Past Surgical History:  Procedure Laterality Date   A/V FISTULAGRAM Right 07/05/2020   Procedure: A/V Fistulagram;  Surgeon: Sherren Kerns, MD;  [facility]: [facility];  Service: Cardiovascular;  Laterality: Right;   AV FISTULA PLACEMENT Left 11/22/2017   Procedure: ARTERIOVENOUS (AV) FISTULA CREATION LEFT ARM;  Surgeon: Sherren Kerns, MD;  Location[facility] OR;  Service: Vascular;  Laterality: Left;   AV FISTULA PLACEMENT Right 04/04/2020   Procedure: RIGHT ARM ARTERIOVENOUS FISTULA CREATION;  Surgeon: Larina Earthly, MD;  Location: [facility];  Service: Vascular;  Laterality: Right;   AV FISTULA PLACEMENT Right 08/20/2020   Procedure: ARTERIOVENOUS (AV) FISTULA LIGATION RIGHT ARM;  Surgeon: Sherren Kerns, MD;  Location: [facility] OR;  Service: Vascular;  Laterality: Right;   BIOPSY  08/07/2016   Procedure: BIOPSY;  Surgeon: Corbin Ade, MD;  Location: [facility];  Service: Endoscopy;;  gastric ulcer biopsy   COLONOSCOPY     ESOPHAGOGASTRODUODENOSCOPY N/A 08/07/2016   LA Grade A esophagitis s/p dilation, small hiatal hernia, multiple gastric ulcers  and erosions, duodenal erosions s/p biopsy. Negative H.pylori    ESOPHAGOGASTRODUODENOSCOPY N/A 11/27/2016   normal esophagus, previously noted gastric ulcers completely healed, normal duodenum.    ESOPHAGOGASTRODUODENOSCOPY (EGD) WITH PROPOFOL N/A 07/23/2020   Procedure: ESOPHAGOGASTRODUODENOSCOPY (EGD) WITH PROPOFOL;  Surgeon: Malissa Hippo, MD;  [facility]: [facility];  Service: Endoscopy;  Laterality: N/A;   FISTULA SUPERFICIALIZATION Left 02/14/2018   Procedure: FISTULA SUPERFICIALIZATION LEFT ARM;  Surgeon:  Chuck Hint, MD;  Location: [facility] OR;  Service: Vascular;  Laterality: Left;   FRACTURE SURGERY Right    ankle   HOT HEMOSTASIS  07/23/2020   Procedure: HOT HEMOSTASIS (ARGON PLASMA COAGULATION/BICAP);  Surgeon: Malissa Hippo, MD;  [facility]: [facility];  Service: Endoscopy;;   INTRAMEDULLARY (IM) NAIL INTERTROCHANTERIC Right 07/12/2020   Procedure: INTRAMEDULLARY (IM) NAIL INTERTROCHANTRIC;  Surgeon: Oliver Barre, MD;  Location: [facility];  Service: Orthopedics;  Laterality: Right;   MALONEY DILATION N/A 08/07/2016   Procedure: Elease Hashimoto DILATION;  Surgeon: Corbin Ade, MD;  [facility]: [facility];  Service: Endoscopy;  Laterality: N/A;   MASTECTOMY, PARTIAL Right    PERIPHERAL VASCULAR BALLOON ANGIOPLASTY Left 07/13/2019   Procedure: PERIPHERAL VASCULAR BALLOON ANGIOPLASTY;  Surgeon: Cephus Shelling, MD;  [facility]: [facility];  Service: Cardiovascular;  Laterality: Left;  arm fistulogram   PERIPHERAL VASCULAR BALLOON ANGIOPLASTY Right 05/22/2020   Procedure: PERIPHERAL VASCULAR BALLOON ANGIOPLASTY;  Surgeon: Leonie Douglas, MD;  [facility]: [facility];  Service: Cardiovascular;  Laterality: Right;  arm fistula   PERIPHERAL VASCULAR BALLOON ANGIOPLASTY Right 07/05/2020   Procedure: PERIPHERAL VASCULAR BALLOON ANGIOPLASTY;  Surgeon: Sherren Kerns, MD;  [facility]: [facility];  Service: Cardiovascular;  Laterality: Right;  arm fistula   PORT-A-CATH REMOVAL Left 11/22/2017   Procedure: REMOVAL PORT-A-CATH LEFT CHEST;  Surgeon: Sherren Kerns, MD;  Location: [facility] OR;  Service: Vascular;  Laterality: Left;   RETINAL DETACHMENT SURGERY Right    SCLEROTHERAPY  07/23/2020   Procedure: SCLEROTHERAPY;  Surgeon: Malissa Hippo, MD;  Location: [facility];  Service: Endoscopy;;   UPPER EXTREMITY VENOGRAPHY N/A 08/22/2020   Procedure: LEFT UPPER & CENTRAL VENOGRAPHY;  Surgeon: Cephus Shelling, MD;  Location: [facility];  Service:  Cardiovascular;  Laterality: N/A;    Social History   Socioeconomic History   Marital status: Single    Spouse name: Not on file   Number of children: Not on file   Years of education: Not on file   Highest education level: Not on file  Occupational History   Occupation: retired   Tobacco Use   Smoking status: Never   Smokeless tobacco: Never  Vaping Use   Vaping status: Never Used  Substance and Sexual Activity   Alcohol use: No    Alcohol/week: 0.0 standard drinks of alcohol   Drug use: No   Sexual activity: Never  Other Topics Concern   Not on file  Social History Narrative   Long term resident of SNF    Social Determinants of Health   Financial Resource Strain: Low Risk  (01/09/2020)   Overall Financial Resource Strain (CARDIA)    Difficulty of Paying Living Expenses: Not very hard  Food Insecurity: No Food Insecurity (01/09/2020)   Hunger Vital Sign    Worried About Running Out of Food in the Last Year: Never true    Ran Out of Food in the Last Year: Never true  Transportation Needs: No Transportation Needs (01/09/2020)  PRAPARE - Administrator, Civil Service (Medical): No    Lack of Transportation (Non-Medical): No  Physical Activity: Inactive (01/09/2020)   Exercise Vital Sign    Days of Exercise per Week: 0 days    Minutes of Exercise per Session: 0 min  Stress: No Stress Concern Present (01/09/2020)   [employer] of Occupational Health - Occupational Stress Questionnaire    Feeling of Stress : Not at all  Social Connections: Moderately Isolated (01/09/2020)   Social Connection and Isolation Panel [NHANES]    Frequency of Communication with Friends and Family: More than three times a week    Frequency of Social Gatherings with Friends and Family: Once a week    Attends Religious Services: More than 4 times per year    Active Member of [employer] or Organizations: No    Attends Banker Meetings: Never    Marital Status: Never married   Intimate Partner Violence: Not At Risk (01/09/2020)   Humiliation, Afraid, Rape, and Kick questionnaire    Fear of Current or Ex-Partner: No    Emotionally Abused: No    Physically Abused: No    Sexually Abused: No   Family History  Problem Relation Age of Onset   Multiple myeloma Sister    Brain cancer Sister    Dementia Mother        died at 74   Stroke Mother    Heart failure Mother    Diabetes Mother    Heart disease Father    Prostate cancer Brother    Colon cancer Neg Hx       VITAL SIGNS BP (!) 164/84   Pulse 100   Temp 98.6 F (37 C)   Resp 20   Ht 5\' 3"  (1.6 m)   Wt 145 lb (65.8 kg)   SpO2 97%   BMI 25.69 kg/m   Outpatient Encounter Medications as of 09/28/2022  Medication Sig   acetaminophen (TYLENOL) 325 MG tablet Take 650 mg by mouth 3 (three) times daily.   acyclovir (ZOVIRAX) 200 MG capsule Take 200 mg by mouth in the morning. (0800)   anastrozole (ARIMIDEX) 1 MG tablet TAKE 1 TABLET BY MOUTH DAILY   calcium-vitamin D (OSCAL WITH D) 500-200 MG-UNIT tablet Take 1 tablet by mouth in the morning. (0800)   Insulin Pen Needle (BD AUTOSHIELD DUO) 30G X 5 MM MISC by Does not apply route. 3/16"   melatonin 3 MG TABS tablet Take 6 mg by mouth at bedtime. (2000) For Sleep   multivitamin (RENA-VIT) TABS tablet Take 1 tablet by mouth at bedtime. (2100)   Nutritional Supplements (ENSURE PLUS PO) Take 1 Can by mouth daily. 8pm   ondansetron (ZOFRAN) 4 MG tablet Take 1 tablet (4 mg total) by mouth every 6 (six) hours as needed for nausea.   ondansetron (ZOFRAN-ODT) 4 MG disintegrating tablet Take 4 mg by mouth daily.   pantoprazole (PROTONIX) 40 MG tablet Take 40 mg by mouth 2 (two) times daily.   sennosides-docusate sodium (SENOKOT-S) 8.6-50 MG tablet Take 1 tablet by mouth 2 (two) times daily.   sertraline (ZOLOFT) 50 MG tablet Take 50 mg by mouth daily.   sucralfate (CARAFATE) 1 GM/10ML suspension Take 1 g by mouth in the morning, at noon, in the evening, and at  bedtime. (0800, 1200, 1700 & 2100)   UNABLE TO FIND Dysphagia 3 Diet   Facility-Administered Encounter Medications as of 09/28/2022  Medication   lanreotide acetate (SOMATULINE DEPOT) 120 MG/0.5ML injection  lanreotide acetate (SOMATULINE DEPOT) 120 MG/0.5ML injection   lanreotide acetate (SOMATULINE DEPOT) 120 MG/0.5ML injection   lanreotide acetate (SOMATULINE DEPOT) 120 MG/0.5ML injection   octreotide (SANDOSTATIN LAR) 30 MG IM injection   octreotide (SANDOSTATIN LAR) 30 MG IM injection     SIGNIFICANT DIAGNOSTIC EXAMS   PREVIOUS   10-01-21: chest x-ray: No evidence of active cardiopulmonary process.   10-01-21: abdominal ultrasound:  1. Cholelithiasis with no evidence of acute cholecystitis. 2. Increased echogenicity of the liver parenchyma, findings suggestive of hepatic steatosis.  10-01-21: ct of chest abdomen and pelvis  No aspiration pneumonia. Mild focal scarring at the medial left lung base.   Coronary artery calcification. Aortic atherosclerotic calcification. Aortic Atherosclerosis  Probably acute superior endplate fractures at T12, L1 and L2. No retropulsed bone.   Renal atrophy. Multiple renal cysts. Indeterminate 2.7 cm lesion at the lower pole the right kidney could be a complicated cyst or a mass. This could be evaluated with renal MRI. 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 Nonobstructing 5 mm stone in the lower pole of the left kidney.   1 x 2 cm cystic abnormality in the ventral head of the pancreas. Best practice recommendation is for reimaging of this every 6 months for 2 years and then every 1 year for 2 years and then every 2 years for 6 years. Follow-up can be discontinued if stable for 10 years. This recommendation could be tailored to this patient's clinical status and life expectancy.  Paget's disease of the right hemipelvis. Diverticulosis without evidence of diverticulitis. Small hiatal hernia. Multiple calcified leiomyomas the uterus.  10-24-21:  DEXA: t score -2.957  NO NEW EXAMS    LABS REVIEWED PREVIOUS    09-30-21: wbc 6.6; hgb 9.6; hct 31.1; mcv 101.3 plt 232; glucose 222; bun 40; creat 6.78; k+ 4.0; na++ 136; ca 9.2; gfr 6; protein 6.8 albumin 3.7  10-01-21: wbc 13.0; hgb 9.1; hct 28.8; mcv 99.3 plt 251; glucose 198; bun 59; creat 9.01; k+ 3.7; na++ 135; ca 9.6; gfr 4; ast 212; alt 173; protein 6.7; albumin 3.6  10-03-21: wbc 7.2; hgb 8.6; hct 27.5; mcv 99.6 plt 179 glucose 105; bun 34; creat 7.04; k+ 3.6; na++ 135; ca 8.8; gfr 5 ast 45; alt 99; protein 6.5; albumin 3.5  10-10-21: hgb a1c 5.3  11-25-21: wbc 4.9; hgb 9.4; hct 30.7; mcv 100.7 plt 237; glucose 171; bun 27; creat 6.88; k+ 4.4; na++ 137; ca 9.0; gfr 6; protein 6.4 albumin 3.5  12-16-21: wbc 5.3; hgb 10.1; hct 33.0 mcv 98.5 plt 256; glucose 119; bun 24; creat 6.62; k+ 4.2 na++ 136; ca 9.3 gfr 6; protein 6.4 albumin 3.5; iron 54; tibc 140; ferritin 272  02-03-22: wbc 3.8; hgb 11.5; hct 37.6; mcv 96.4 plt 212; glucose 164; bun 28; creat 7.99; k+ 4.4; na++ 139; ca 8.3 gfr 5; protein 6.7 albumin 3.7 mag 2.1 02-17-22: wbc 4.6; hgb 11.5; hct 36.5; mcv 93.6 plt 207; glucose 95; bun 23 creat 7.44 k+ 4.1; na++ 135; ca 7.8; gfr 5 protein 6.9; albumin 3.8  03-31-22: wbc 4.7; hgb 10.0; hct 32.2; mcv 96.4 plt 278; glucose 155; bun 28; creat 6.50; k+ 4.0; na++ 132; ca 8.0; gfr 6 protein 6.9 albumin 3.6 05-19-22: wbc 4.1; hgb 9.6; hct 30.9; mcv 98.7 plt 236; glucose 170; bun 33; creat 6.03; k+ 3.9; na++ 137; ca 8.1; gfr 6; protein 6.6 albumin 3.4; mag 2.1  06-23-22: wbc 4.9; hgb 9.1; hct 28.7 mcv 101.4 plt 286; glucose 158; bun  25; creat 5.85; k+ 3.9 na++ 136; ca 8.6 gfr 7 protein 6.8 albumin 3.3 mag 2.2 07-02-22: hgb A1c 5.1 07-07-22: wbc 4.9; hgb 10.3; hct 32.8; mcv 102.5 plt 265; glucose 141; bun 32; creat 6.15; k+ 4.3; na++ 136; ca 8.9; gfr 6 protein 7.2 albumin 3.7; mag 2.3 07-20-22: vitamin B 12: 660; tsh 1.836 08-04-22: wbc 4.6; hgb 10.7; ht 33.1; mcv 100.3 plt 234; glucose 120; bun 41; creat  6.46; k+ 4.2; na++ 135; ca 9.8; gfr 6; protein 7.3; albumin 3.7 mag 2.4  LABS REVIEWED:   09-15-22: wbc 5.2; hgb 9.9; hct 31.0; mcv 103.3 plt 224; glucose 132; bun 22; creat 6.19; k+ 4.2; na++ 135; ca 9.5 gfr 6 protein 6.9 albumin 3.7      Review of Systems  Constitutional:  Negative for malaise/fatigue.  Respiratory:  Negative for cough and shortness of breath.   Cardiovascular:  Negative for chest pain, palpitations and leg swelling.  Gastrointestinal:  Negative for abdominal pain, constipation and heartburn.  Musculoskeletal:  Negative for back pain, joint pain and myalgias.  Skin: Negative.   Neurological:  Negative for dizziness.  Psychiatric/Behavioral:  The patient is not nervous/anxious.     Physical Exam Constitutional:      General: She is not in acute distress.    Appearance: She is well-developed. She is not diaphoretic.  Neck:     Thyroid: No thyromegaly.  Cardiovascular:     Rate and Rhythm: Normal rate and regular rhythm.     Pulses: Normal pulses.     Heart sounds: Normal heart sounds.  Pulmonary:     Effort: Pulmonary effort is normal. No respiratory distress.     Breath sounds: Normal breath sounds.  Abdominal:     General: Bowel sounds are normal. There is no distension.     Palpations: Abdomen is soft.     Tenderness: There is no abdominal tenderness.  Musculoskeletal:     Cervical back: Neck supple.     Right lower leg: No edema.     Left lower leg: No edema.     Comments: Able to move all extremities   Lymphadenopathy:     Cervical: No cervical adenopathy.  Skin:    General: Skin is warm and dry.     Comments: Chest wall dialysis access     Neurological:     Mental Status: She is alert. Mental status is at baseline.  Psychiatric:        Mood and Affect: Mood normal.          ASSESSMENT/PLAN  TODAY  Hyperlipidemia associated with type 2 diabetes mellitus: ldl 89; is off lipitor; has elevated liver enzymes.   2. Aortic  atherosclerosis/atherosclerotic peripheral vascular disease (ct 10-04-18) ldl 89  3. Vascular dementia with behavioral disturbance: weight is 145 pounds and stable.    PREVIOUS   4. Diabetes mellitus type 2 with end stage renal disease (ESRD) hgb A1c 5.1 is presently off medications.   5. Anemia due to end stage renal disease: hgb 9.9  6. Post menopausal osteoporosis: t score -2.957 will continue supplements  7. Stage 1 infiltrating ductal carcinoma of female right breast/status post right mastectomy will continue arimidex 1 mg daily  8. Multiple myeloma not having achieved remission: is followed by oncology    9. Herpes: without outbreak will continue acyclovir 200 mg daily   10. Major depression chronic recurrent: will continue zoloft 50 mg daily  11. Protein calorie malnutrition: protein 7.3 albumin 3.7 will continue supplements and  prostat with meals.   12. Multiple gastric ulcers/duodenal ulcer/upper GI bleed/esophageal reflux disease without esophagitis: will continue protonix 40 mg twice daily carafate 1 gm with before meals and bedtime. Zofran 4 mg daily and every 6 hours as needed    13. Generalized chronic pain: will continue tylenol 650 mg three times daily   14. Chronic kidney disease with end stage renal disease on dialysis due to type 2 diabetes mellitus/dependence on dialysis: will continue dialysis three days per week: 1200 cc fluid restriction; calcium acetate 1334 mg with meals  15. Hypertension associated with end stage renal disease (ESRD) due to type 2 diabetes mellitus: b/p 164/84 will monitor        Synthia Innocent NP [facility]  call [phone]

## 2022-09-28 NOTE — Progress Notes (Signed)
[facility] [address] [place] [address]    Clinic Day:  09/29/2022  Referring physician: Sharee Holster, NP  Patient Care Team: Sharee Holster, NP as PCP - General (Geriatric Medicine) [name], Gerrit Friends, MD as Consulting Physician (Gastroenterology) [name], MD as Medical Oncologist (Medical Oncology) Salomon Mast, MD (Inactive) as Consulting Physician (Nephrology) [facility]   ASSESSMENT & PLAN:   Assessment: 1.  IgA lambda plasma cell myeloma, stage II, standard risk: -BMBX on 09/20/2018 shows plasma cell myeloma, 30% plasma cells.  FISH panel was normal.  Chromosome analysis normal. -Labs at diagnosis on 08/29/2018 with M spike 0.9 g.  Kappa light chains 148, lambda light chains 1132, ratio 0.13.  LDH normal.  Beta-2 microglobulin 13.5.  She had transfusion dependent anemia. -Velcade and dexamethasone started on 10/11/2018. -Myeloma panel on 07/11/2019 shows SPEP is negative.  Free light chain ratio improved to 1.09.  Lambda light chains are 128, improved from 141.  Kappa light chains are 140. -She has been resident at [facility] since 10/12/2019.   2.  Stage I IDC of the right breast, ER/PR positive, HER-2 negative: -She is on anastrozole.   Plan: 1.  IgA lambda plasma cell myeloma: - She is tolerating Velcade every other week very well.  She also receives dexamethasone 10 mg on days of Velcade. - Reviewed labs from 06/23/2022.  M spike is 0.  Lambda light chains have gone up to 528 with ratio of 0.3.  Immunofixation shows IgA lambda. - Reviewed labs from today which showed acceptable CBC and LFTs. - Recommend continuing Velcade every other week at this time.  If the light chains continue to go up, consider changing Velcade to 3 weeks on/1 week off. - She has developed slight numbness in the fingertips mostly at nighttime without any pain.  Will closely monitor.   2.  ESRD on HD: - Continue HD on  Monday, Wednesday and Friday.   3.  Bone strengthening: - Bisphosphonates were not started due to patient being on dialysis.   4.  Osteopenia: - Last bone density was in 2017.  Will consider repeating during this year.   5.  Right breast cancer: - Continue anastrozole.  She is tolerating well.  No orders of the defined types were placed in this encounter.      [name],acting as a Neurosurgeon for [employer], MD.,have documented all relevant documentation on the behalf of [name], MD,as directed by  [name], MD while in the presence of [name], MD.  ***   Woodford R Teague   7/22/20241:10 PM  CHIEF COMPLAINT:   Diagnosis: plasma cell myeloma and right breast cancer    Cancer Staging  Stage 1 infiltrating ductal carcinoma of right female breast [facility]) Staging form: Breast, AJCC 7th Edition - Clinical stage from 08/30/2015: Stage IA (T1c, N0, M0) - Signed by Ellouise Newer, PA-C on 08/30/2015    Prior Therapy: none  Current Therapy:  maintenance Velcade & Decadron 3/4 weeks; anastrozole   HISTORY OF PRESENT ILLNESS:   Oncology History  Stage 1 infiltrating ductal carcinoma of right female breast (HCC)  09/12/2014 Mammogram   Mass in upper outer R breast, middle third depth appears slightly larger than it was on prior exam with more irreg spiculated margins   10/02/2014 Pathology Results   biopsy with invasive ductal carcinoma high grade 1.1 cm, dcis solid type. additional R breast tissue, excision, invasive ductal  high grade 0.9 cm    10/24/2014 Pathology Results   no residual invasive carcinoma, DCIS, focal, atypical ductal hyperplasia, 0/7 LN positive for metastatic carcinoma ER > 90%, PR 30%, HER 2 2+   10/24/2014 Cancer Staging   T1cN0M0   10/24/2014 Surgery   R mastectomy, T1c, N0   12/28/2014 - 11/22/2015 Chemotherapy   Taxol/herceptin weekly X 12, Herceptin every 21 days, last due in September 2017   03/11/2015 -   Anti-estrogen oral therapy   Arimidex 1 mg daily   09/12/2015 Imaging   MUGA- The left ventricular ejection fraction equals 68%.   06/08/2016 Treatment Plan Change   Started Nerlynx   Multiple myeloma (HCC)  10/04/2018 Initial Diagnosis   Multiple myeloma (HCC)   10/11/2018 - 10/28/2021 Chemotherapy   Patient is on Treatment Plan : MYELOMA NON-TRANSPLANT CANDIDATES VRd weekly d21d      11/25/2021 -  Chemotherapy   Patient is on Treatment Plan : MYELOMA MAINTENANCE Bortezomib SQ q14d        INTERVAL HISTORY:   Carly Wood is a 87 y.o. female presenting to clinic today for follow up of plasma cell myeloma and breast cancer. She was last seen by me on 07/07/22.  Today, she states that she is doing well overall. Her appetite level is at ***%. Her energy level is at ***%.  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   Dysphagia    Dyspnea    with activity   GERD (gastroesophageal reflux disease)    Glaucoma    Hyperlipidemia    Hypertension    Multiple myeloma (HCC)    Pneumonia    Stage 1 infiltrating ductal carcinoma of right female breast (HCC) 08/21/2015   ER+ PR+ HER 2 neu + (3+) T1cN0     Surgical History: Past Surgical History:  Procedure Laterality Date   A/V FISTULAGRAM Right 07/05/2020   Procedure: A/V Fistulagram;  Surgeon: Sherren Kerns, MD;  [facility];  Service: Cardiovascular;  Laterality: Right;   AV FISTULA PLACEMENT Left 11/22/2017   Procedure: ARTERIOVENOUS (AV) FISTULA CREATION LEFT ARM;  Surgeon: Sherren Kerns, MD;  Location: [facility] OR;  Service: Vascular;  Laterality: Left;   AV FISTULA PLACEMENT Right 04/04/2020   Procedure: RIGHT ARM ARTERIOVENOUS FISTULA CREATION;  Surgeon: Larina Earthly, MD;  Location: [facility];  Service: Vascular;  Laterality: Right;   AV FISTULA PLACEMENT Right 08/20/2020   Procedure: ARTERIOVENOUS (AV) FISTULA LIGATION RIGHT ARM;  Surgeon: Sherren Kerns, MD;  Location: [facility] OR;  Service: Vascular;  Laterality: Right;   BIOPSY  08/07/2016   Procedure: BIOPSY;  Surgeon: Corbin Ade, MD;  [facility]: [facility];  Service: Endoscopy;;  gastric ulcer biopsy   COLONOSCOPY     ESOPHAGOGASTRODUODENOSCOPY N/A 08/07/2016   LA Grade A esophagitis s/p dilation, small hiatal hernia, multiple gastric ulcers and erosions, duodenal erosions s/p biopsy. Negative H.pylori    ESOPHAGOGASTRODUODENOSCOPY N/A 11/27/2016   normal esophagus, previously noted gastric ulcers completely  healed, normal duodenum.    ESOPHAGOGASTRODUODENOSCOPY (EGD) WITH PROPOFOL N/A 07/23/2020   Procedure: ESOPHAGOGASTRODUODENOSCOPY (EGD) WITH PROPOFOL;  Surgeon: Malissa Hippo, MD;  [facility];  Service: Endoscopy;  Laterality: N/A;   FISTULA SUPERFICIALIZATION Left 02/14/2018   Procedure: FISTULA SUPERFICIALIZATION LEFT ARM;  Surgeon: Chuck Hint, MD;  Location: [facility] OR;  Service: Vascular;  Laterality: Left;   FRACTURE SURGERY Right    ankle   HOT HEMOSTASIS  07/23/2020   Procedure: HOT HEMOSTASIS (ARGON PLASMA COAGULATION/BICAP);  Surgeon: Malissa Hippo, MD;  [facility];  Service: Endoscopy;;   INTRAMEDULLARY (IM) NAIL INTERTROCHANTERIC Right 07/12/2020   Procedure: INTRAMEDULLARY (IM) NAIL INTERTROCHANTRIC;  Surgeon: Oliver Barre, MD;  Location: [facility];  Service: Orthopedics;  Laterality: Right;   MALONEY DILATION N/A 08/07/2016   Procedure: Elease Hashimoto DILATION;  Surgeon: Corbin Ade, MD;  [facility];  Service: Endoscopy;  Laterality: N/A;    MASTECTOMY, PARTIAL Right    PERIPHERAL VASCULAR BALLOON ANGIOPLASTY Left 07/13/2019   Procedure: PERIPHERAL VASCULAR BALLOON ANGIOPLASTY;  Surgeon: Cephus Shelling, MD;  [facility];  Service: Cardiovascular;  Laterality: Left;  arm fistulogram   PERIPHERAL VASCULAR BALLOON ANGIOPLASTY Right 05/22/2020   Procedure: PERIPHERAL VASCULAR BALLOON ANGIOPLASTY;  Surgeon: Leonie Douglas, MD;  [facility];  Service: Cardiovascular;  Laterality: Right;  arm fistula   PERIPHERAL VASCULAR BALLOON ANGIOPLASTY Right 07/05/2020   Procedure: PERIPHERAL VASCULAR BALLOON ANGIOPLASTY;  Surgeon: Sherren Kerns, MD;  [facility];  Service: Cardiovascular;  Laterality: Right;  arm fistula   PORT-A-CATH REMOVAL Left 11/22/2017   Procedure: REMOVAL PORT-A-CATH LEFT CHEST;  Surgeon: Sherren Kerns, MD;  Location: [facility] OR;  Service: Vascular;  Laterality: Left;   RETINAL DETACHMENT SURGERY Right    SCLEROTHERAPY  07/23/2020   Procedure: SCLEROTHERAPY;  Surgeon: Malissa Hippo, MD;  [facility];  Service: Endoscopy;;   UPPER EXTREMITY VENOGRAPHY N/A 08/22/2020   Procedure: LEFT UPPER & CENTRAL VENOGRAPHY;  Surgeon: Cephus Shelling, MD;  [facility];  Service: Cardiovascular;  Laterality: N/A;    Social History: Social History   Socioeconomic History   Marital status: Single    Spouse name: Not on file   Number of children: Not on file   Years of education: Not on file   Highest education level: Not on file  Occupational History   Occupation: retired   Tobacco Use   Smoking status: Never   Smokeless tobacco: Never  Vaping Use   Vaping status: Never Used  Substance and Sexual Activity   Alcohol use: No    Alcohol/week: 0.0 standard drinks of alcohol   Drug use: No   Sexual activity: Never  Other Topics Concern   Not on file  Social History Narrative   Long term resident of SNF    Social Determinants of  Health   Financial Resource Strain: Low Risk  (01/09/2020)   Overall Financial Resource Strain (CARDIA)    Difficulty of Paying Living Expenses: Not very hard  Food Insecurity: No Food Insecurity (01/09/2020)   Hunger Vital Sign    Worried About Running Out of Food in the Last Year: Never true    Ran Out of Food in the Last Year: Never true  Transportation Needs: No Transportation Needs (01/09/2020)   PRAPARE - Administrator, Civil Service (Medical): No    Lack of Transportation (Non-Medical): No  Physical  Activity: Inactive (01/09/2020)   Exercise Vital Sign    Days of Exercise per Week: 0 days    Minutes of Exercise per Session: 0 min  Stress: No Stress Concern Present (01/09/2020)   [employer] of Occupational Health - Occupational Stress Questionnaire    Feeling of Stress : Not at all  Social Connections: Moderately Isolated (01/09/2020)   Social Connection and Isolation Panel [NHANES]    Frequency of Communication with Friends and Family: More than three times a week    Frequency of Social Gatherings with Friends and Family: Once a week    Attends Religious Services: More than 4 times per year    Active Member of [employer] or Organizations: No    Attends Banker Meetings: Never    Marital Status: Never married  Intimate Partner Violence: Not At Risk (01/09/2020)   Humiliation, Afraid, Rape, and Kick questionnaire    Fear of Current or Ex-Partner: No    Emotionally Abused: No    Physically Abused: No    Sexually Abused: No    Family History: Family History  Problem Relation Age of Onset   Multiple myeloma Sister    Brain cancer Sister    Dementia Mother        died at 30   Stroke Mother    Heart failure Mother    Diabetes Mother    Heart disease Father    Prostate cancer Brother    Colon cancer Neg Hx     Current Medications:  Current Outpatient Medications:    acetaminophen (TYLENOL) 325 MG tablet, Take 650 mg by mouth 3 (three) times  daily., Disp: , Rfl:    acyclovir (ZOVIRAX) 200 MG capsule, Take 200 mg by mouth in the morning. (0800), Disp: , Rfl:    anastrozole (ARIMIDEX) 1 MG tablet, TAKE 1 TABLET BY MOUTH DAILY, Disp: 30 tablet, Rfl: 6   calcium-vitamin D (OSCAL WITH D) 500-200 MG-UNIT tablet, Take 1 tablet by mouth in the morning. (0800), Disp: , Rfl:    Insulin Pen Needle (BD AUTOSHIELD DUO) 30G X 5 MM MISC, by Does not apply route. 3/16", Disp: , Rfl:    melatonin 3 MG TABS tablet, Take 6 mg by mouth at bedtime. (2000) For Sleep, Disp: , Rfl:    multivitamin (RENA-VIT) TABS tablet, Take 1 tablet by mouth at bedtime. (2100), Disp: , Rfl:    Nutritional Supplements (ENSURE PLUS PO), Take 1 Can by mouth daily. 8pm, Disp: , Rfl:    ondansetron (ZOFRAN) 4 MG tablet, Take 1 tablet (4 mg total) by mouth every 6 (six) hours as needed for nausea., Disp: 20 tablet, Rfl: 0   ondansetron (ZOFRAN-ODT) 4 MG disintegrating tablet, Take 4 mg by mouth daily., Disp: , Rfl:    pantoprazole (PROTONIX) 40 MG tablet, Take 40 mg by mouth 2 (two) times daily., Disp: , Rfl:    sennosides-docusate sodium (SENOKOT-S) 8.6-50 MG tablet, Take 1 tablet by mouth 2 (two) times daily., Disp: , Rfl:    sertraline (ZOLOFT) 50 MG tablet, Take 50 mg by mouth daily., Disp: , Rfl:    sucralfate (CARAFATE) 1 GM/10ML suspension, Take 1 g by mouth in the morning, at noon, in the evening, and at bedtime. (0800, 1200, 1700 & 2100), Disp: , Rfl:    UNABLE TO FIND, Dysphagia 3 Diet, Disp: , Rfl:  No current facility-administered medications for this visit.  Facility-Administered Medications Ordered in Other Visits:    lanreotide acetate (SOMATULINE DEPOT) 120 MG/0.5ML injection, , , ,  lanreotide acetate (SOMATULINE DEPOT) 120 MG/0.5ML injection, , , ,    lanreotide acetate (SOMATULINE DEPOT) 120 MG/0.5ML injection, , , ,    lanreotide acetate (SOMATULINE DEPOT) 120 MG/0.5ML injection, , , ,    octreotide (SANDOSTATIN LAR) 30 MG IM injection, , , ,     octreotide (SANDOSTATIN LAR) 30 MG IM injection, , , ,    Allergies: Allergies  Allergen Reactions   Ace Inhibitors Other (See Comments) and Cough    Tongue swell , ie angioedema   Angiotensin Receptor Blockers     Angioedema with ACE-I   Penicillins Other (See Comments)    Unsure of reaction Has patient had a PCN reaction causing immediate rash, facial/tongue/throat swelling, SOB or lightheadedness with hypotension: Unknown Has patient had a PCN reaction causing severe rash involving mucus membranes or skin necrosis: Unknown Has patient had a PCN reaction that required hospitalization: No Has patient had a PCN reaction occurring within the last 10 years: Unknown If all of the above answers are "NO", then may proceed with Cephalosporin use.     Penicillin G     REVIEW OF SYSTEMS:   Review of Systems  Constitutional:  Negative for chills, fatigue and fever.  HENT:   Negative for lump/mass, mouth sores, nosebleeds, sore throat and trouble swallowing.   Eyes:  Negative for eye problems.  Respiratory:  Negative for cough and shortness of breath.   Cardiovascular:  Negative for chest pain, leg swelling and palpitations.  Gastrointestinal:  Negative for abdominal pain, constipation, diarrhea, nausea and vomiting.  Genitourinary:  Negative for bladder incontinence, difficulty urinating, dysuria, frequency, hematuria and nocturia.   Musculoskeletal:  Negative for arthralgias, back pain, flank pain, myalgias and neck pain.  Skin:  Negative for itching and rash.  Neurological:  Negative for dizziness, headaches and numbness.  Hematological:  Does not bruise/bleed easily.  Psychiatric/Behavioral:  Negative for depression, sleep disturbance and suicidal ideas. The patient is not nervous/anxious.   All other systems reviewed and are negative.    VITALS:   There were no vitals taken for this visit.  Wt Readings from Last 3 Encounters:  09/15/22 132 lb 4.4 oz (60 kg)  09/01/22 136 lb 9.6  oz (62 kg)  08/20/22 142 lb 3.2 oz (64.5 kg)    There is no height or weight on file to calculate BMI.  Performance status (ECOG): 1 - Symptomatic but completely ambulatory  PHYSICAL EXAM:   Physical Exam Vitals and nursing note reviewed. Exam conducted with a chaperone present.  Constitutional:      Appearance: Normal appearance.  Cardiovascular:     Rate and Rhythm: Normal rate and regular rhythm.     Pulses: Normal pulses.     Heart sounds: Normal heart sounds.  Pulmonary:     Effort: Pulmonary effort is normal.     Breath sounds: Normal breath sounds.  Abdominal:     Palpations: Abdomen is soft. There is no hepatomegaly, splenomegaly or mass.     Tenderness: There is no abdominal tenderness.  Musculoskeletal:     Right lower leg: No edema.     Left lower leg: No edema.  Lymphadenopathy:     Cervical: No cervical adenopathy.     Right cervical: No superficial, deep or posterior cervical adenopathy.    Left cervical: No superficial, deep or posterior cervical adenopathy.     Upper Body:     Right upper body: No supraclavicular or axillary adenopathy.     Left upper body: No  supraclavicular or axillary adenopathy.  Neurological:     General: No focal deficit present.     Mental Status: She is alert and oriented to person, place, and time.  Psychiatric:        Mood and Affect: Mood normal.        Behavior: Behavior normal.     LABS:      Latest Ref Rng & Units 09/15/2022   12:41 PM 09/01/2022   12:57 PM 08/04/2022   12:57 PM  CBC  WBC 4.0 - 10.5 K/uL 5.2  4.2  4.6   Hemoglobin 12.0 - 15.0 g/dL 9.9  16.1  09.6   Hematocrit 36.0 - 46.0 % 31.0  31.7  33.1   Platelets 150 - 400 K/uL 224  212  234       Latest Ref Rng & Units 09/15/2022   12:41 PM 09/01/2022   12:57 PM 08/04/2022   12:57 PM  CMP  Glucose 70 - 99 mg/dL 045  409  811   BUN 8 - 23 mg/dL 22  21  41   Creatinine 0.44 - 1.00 mg/dL 9.14  7.82  9.56   Sodium 135 - 145 mmol/L 135  136  135   Potassium 3.5 -  5.1 mmol/L 4.2  4.1  4.2   Chloride 98 - 111 mmol/L 96  96  95   CO2 22 - 32 mmol/L 25  26  23    Calcium 8.9 - 10.3 mg/dL 9.5  9.7  9.8   Total Protein 6.5 - 8.1 g/dL 6.9  6.9  7.3   Total Bilirubin 0.3 - 1.2 mg/dL 0.4  0.6  0.7   Alkaline Phos 38 - 126 U/L 54  52  52   AST 15 - 41 U/L 18  14  15    ALT 0 - 44 U/L 12  10  13       No results found for: "CEA1", "CEA" / No results found for: "CEA1", "CEA" No results found for: "PSA1" No results found for: "CAN199" No results found for: "CAN125"  Lab Results  Component Value Date   TOTALPROTELP 6.6 09/15/2022   TOTALPROTELP 6.6 09/15/2022   ALBUMINELP 3.8 09/15/2022   A1GS 0.3 09/15/2022   A2GS 0.7 09/15/2022   BETS 1.0 09/15/2022   GAMS 0.7 09/15/2022   MSPIKE 0.3 (H) 09/15/2022   SPEI Comment 09/15/2022   Lab Results  Component Value Date   TIBC 140 (L) 12/16/2021   TIBC 142 (L) 08/12/2021   TIBC 184 (L) 04/18/2019   FERRITIN 272 12/16/2021   FERRITIN 312 (H) 08/12/2021   FERRITIN 72 04/18/2019   IRONPCTSAT 39 (H) 12/16/2021   IRONPCTSAT 65 (H) 08/12/2021   IRONPCTSAT 26 04/18/2019   Lab Results  Component Value Date   LDH 120 10/21/2021   LDH 129 10/14/2021   LDH 136 10/07/2020     STUDIES:   No results found.

## 2022-09-29 ENCOUNTER — Inpatient Hospital Stay: Payer: Medicare PPO

## 2022-09-29 ENCOUNTER — Inpatient Hospital Stay (HOSPITAL_BASED_OUTPATIENT_CLINIC_OR_DEPARTMENT_OTHER): Payer: Medicare PPO | Admitting: Hematology

## 2022-09-29 VITALS — BP 135/73 | HR 85 | Temp 98.5°F | Resp 20

## 2022-09-29 DIAGNOSIS — C9000 Multiple myeloma not having achieved remission: Secondary | ICD-10-CM

## 2022-09-29 DIAGNOSIS — Z5112 Encounter for antineoplastic immunotherapy: Secondary | ICD-10-CM | POA: Diagnosis not present

## 2022-09-29 LAB — COMPREHENSIVE METABOLIC PANEL
ALT: 11 U/L (ref 0–44)
AST: 16 U/L (ref 15–41)
Albumin: 3.8 g/dL (ref 3.5–5.0)
Alkaline Phosphatase: 56 U/L (ref 38–126)
Anion gap: 15 (ref 5–15)
BUN: 22 mg/dL (ref 8–23)
CO2: 25 mmol/L (ref 22–32)
Calcium: 9.7 mg/dL (ref 8.9–10.3)
Chloride: 93 mmol/L — ABNORMAL LOW (ref 98–111)
Creatinine, Ser: 5.82 mg/dL — ABNORMAL HIGH (ref 0.44–1.00)
GFR, Estimated: 7 mL/min — ABNORMAL LOW (ref >60)
Glucose, Bld: 123 mg/dL — ABNORMAL HIGH (ref 70–99)
Potassium: 3.8 mmol/L (ref 3.5–5.1)
Sodium: 133 mmol/L — ABNORMAL LOW (ref 135–145)
Total Bilirubin: 0.7 mg/dL (ref 0.3–1.2)
Total Protein: 7.4 g/dL (ref 6.5–8.1)

## 2022-09-29 LAB — CBC WITH DIFFERENTIAL/PLATELET
Abs Immature Granulocytes: 0.02 10*3/uL (ref 0.00–0.07)
Basophils Absolute: 0 10*3/uL (ref 0.0–0.1)
Basophils Relative: 1 %
Eosinophils Absolute: 0.2 10*3/uL (ref 0.0–0.5)
Eosinophils Relative: 4 %
HCT: 32.9 % — ABNORMAL LOW (ref 36.0–46.0)
Hemoglobin: 10.6 g/dL — ABNORMAL LOW (ref 12.0–15.0)
Immature Granulocytes: 0 %
Lymphocytes Relative: 23 %
Lymphs Abs: 1.2 10*3/uL (ref 0.7–4.0)
MCH: 32.9 pg (ref 26.0–34.0)
MCHC: 32.2 g/dL (ref 30.0–36.0)
MCV: 102.2 fL — ABNORMAL HIGH (ref 80.0–100.0)
Monocytes Absolute: 0.6 10*3/uL (ref 0.1–1.0)
Monocytes Relative: 12 %
Neutro Abs: 3.1 10*3/uL (ref 1.7–7.7)
Neutrophils Relative %: 60 %
Platelets: 225 10*3/uL (ref 150–400)
RBC: 3.22 MIL/uL — ABNORMAL LOW (ref 3.87–5.11)
RDW: 15.9 % — ABNORMAL HIGH (ref 11.5–15.5)
WBC: 5.1 10*3/uL (ref 4.0–10.5)
nRBC: 0 % (ref 0.0–0.2)

## 2022-09-29 LAB — MAGNESIUM: Magnesium: 2.3 mg/dL (ref 1.7–2.4)

## 2022-09-29 MED ORDER — BORTEZOMIB CHEMO SQ INJECTION 3.5 MG (2.5MG/ML)
1.2000 mg/m2 | Freq: Once | INTRAMUSCULAR | Status: AC
  Administered 2022-09-29: 2 mg via SUBCUTANEOUS
  Filled 2022-09-29: qty 0.8

## 2022-09-29 MED ORDER — PROCHLORPERAZINE MALEATE 10 MG PO TABS
10.0000 mg | ORAL_TABLET | Freq: Once | ORAL | Status: AC
  Administered 2022-09-29: 10 mg via ORAL
  Filled 2022-09-29: qty 1

## 2022-09-29 NOTE — Patient Instructions (Signed)

## 2022-09-29 NOTE — Patient Instructions (Signed)
[facility]  Discharge Instructions: Thank you for choosing [facility] to provide your oncology and hematology care.  If you have a lab appointment with the Cancer Center - please note that after April 8th, 2024, all labs will be drawn in the cancer center.  You do not have to check in or register with the main entrance as you have in the past but will complete your check-in in the cancer center.  Wear comfortable clothing and clothing appropriate for easy access to any Portacath or PICC line.   We strive to give you quality time with your provider. You may need to reschedule your appointment if you arrive late (15 or more minutes).  Arriving late affects you and other patients whose appointments are after yours.  Also, if you miss three or more appointments without notifying the office, you may be dismissed from the clinic at the provider's discretion.      For prescription refill requests, have your pharmacy contact our office and allow 72 hours for refills to be completed.    Today you received the following chemotherapy and/or immunotherapy agents Velcade      To help prevent nausea and vomiting after your treatment, we encourage you to take your nausea medication as directed.  BELOW ARE SYMPTOMS THAT SHOULD BE REPORTED IMMEDIATELY: *FEVER GREATER THAN 100.4 F (38 C) OR HIGHER *CHILLS OR SWEATING *NAUSEA AND VOMITING THAT IS NOT CONTROLLED WITH YOUR NAUSEA MEDICATION *UNUSUAL SHORTNESS OF BREATH *UNUSUAL BRUISING OR BLEEDING *URINARY PROBLEMS (pain or burning when urinating, or frequent urination) *BOWEL PROBLEMS (unusual diarrhea, constipation, pain near the anus) TENDERNESS IN MOUTH AND THROAT WITH OR WITHOUT PRESENCE OF ULCERS (sore throat, sores in mouth, or a toothache) UNUSUAL RASH, SWELLING OR PAIN  UNUSUAL VAGINAL DISCHARGE OR ITCHING   Items with * indicate a potential emergency and should be followed up as soon as possible or go to the  Emergency Department if any problems should occur.  Please show the CHEMOTHERAPY ALERT CARD or IMMUNOTHERAPY ALERT CARD at check-in to the Emergency Department and triage nurse.  Should you have questions after your visit or need to cancel or reschedule your appointment, please contact [facility] [phone]  and follow the prompts.  Office hours are 8:00 a.m. to 4:30 p.m. Monday - Friday. Please note that voicemails left after 4:00 p.m. may not be returned until the following business day.  We are closed weekends and major holidays. You have access to a nurse at all times for urgent questions. Please call the main number to the clinic [phone] and follow the prompts.  For any non-urgent questions, you may also contact your provider using MyChart. We now offer e-Visits for anyone 18 and older to request care online for non-urgent symptoms. For details visit [email or web address].   Also download the MyChart app! Go to the app store, search "MyChart", open the app, select [place], and log in with your MyChart username and password.   

## 2022-09-29 NOTE — Progress Notes (Signed)
Patient presents today for Velcade injection per providers order.  Vital signs and labs reviewed by MD.  Message received from Chapman Moss RN/ Dr. Ellin Saba patient okay for treatment.   Stable during administration without incident; injection site WNL; see MAR for injection details.  Patient tolerated procedure well and without incident.  No questions or complaints noted at this time.

## 2022-09-29 NOTE — Progress Notes (Signed)
Patient has been examined by Dr. Katragadda. Vital signs and labs have been reviewed by MD - ANC, Creatinine, LFTs, hemoglobin, and platelets are within treatment parameters per M.D. - pt may proceed with treatment.  Primary RN and pharmacy notified.  

## 2022-09-30 ENCOUNTER — Other Ambulatory Visit: Payer: Self-pay

## 2022-09-30 DIAGNOSIS — Z992 Dependence on renal dialysis: Secondary | ICD-10-CM | POA: Diagnosis not present

## 2022-09-30 DIAGNOSIS — N2581 Secondary hyperparathyroidism of renal origin: Secondary | ICD-10-CM | POA: Diagnosis not present

## 2022-09-30 DIAGNOSIS — N186 End stage renal disease: Secondary | ICD-10-CM | POA: Diagnosis not present

## 2022-10-02 DIAGNOSIS — N2581 Secondary hyperparathyroidism of renal origin: Secondary | ICD-10-CM | POA: Diagnosis not present

## 2022-10-02 DIAGNOSIS — Z992 Dependence on renal dialysis: Secondary | ICD-10-CM | POA: Diagnosis not present

## 2022-10-02 DIAGNOSIS — N186 End stage renal disease: Secondary | ICD-10-CM | POA: Diagnosis not present

## 2022-10-05 DIAGNOSIS — N2581 Secondary hyperparathyroidism of renal origin: Secondary | ICD-10-CM | POA: Diagnosis not present

## 2022-10-05 DIAGNOSIS — Z992 Dependence on renal dialysis: Secondary | ICD-10-CM | POA: Diagnosis not present

## 2022-10-05 DIAGNOSIS — N186 End stage renal disease: Secondary | ICD-10-CM | POA: Diagnosis not present

## 2022-10-07 DIAGNOSIS — N186 End stage renal disease: Secondary | ICD-10-CM | POA: Diagnosis not present

## 2022-10-07 DIAGNOSIS — N2581 Secondary hyperparathyroidism of renal origin: Secondary | ICD-10-CM | POA: Diagnosis not present

## 2022-10-07 DIAGNOSIS — Z992 Dependence on renal dialysis: Secondary | ICD-10-CM | POA: Diagnosis not present

## 2022-10-08 DIAGNOSIS — N186 End stage renal disease: Secondary | ICD-10-CM | POA: Diagnosis not present

## 2022-10-08 DIAGNOSIS — Z992 Dependence on renal dialysis: Secondary | ICD-10-CM | POA: Diagnosis not present

## 2022-10-09 DIAGNOSIS — N2581 Secondary hyperparathyroidism of renal origin: Secondary | ICD-10-CM | POA: Diagnosis not present

## 2022-10-09 DIAGNOSIS — Z992 Dependence on renal dialysis: Secondary | ICD-10-CM | POA: Diagnosis not present

## 2022-10-09 DIAGNOSIS — N186 End stage renal disease: Secondary | ICD-10-CM | POA: Diagnosis not present

## 2022-10-12 ENCOUNTER — Other Ambulatory Visit: Payer: Self-pay

## 2022-10-12 DIAGNOSIS — N186 End stage renal disease: Secondary | ICD-10-CM | POA: Diagnosis not present

## 2022-10-12 DIAGNOSIS — Z992 Dependence on renal dialysis: Secondary | ICD-10-CM | POA: Diagnosis not present

## 2022-10-12 DIAGNOSIS — C9000 Multiple myeloma not having achieved remission: Secondary | ICD-10-CM

## 2022-10-12 DIAGNOSIS — N2581 Secondary hyperparathyroidism of renal origin: Secondary | ICD-10-CM | POA: Diagnosis not present

## 2022-10-13 ENCOUNTER — Inpatient Hospital Stay: Payer: Medicare PPO

## 2022-10-13 ENCOUNTER — Non-Acute Institutional Stay: Payer: Self-pay | Admitting: Internal Medicine

## 2022-10-13 ENCOUNTER — Encounter: Payer: Self-pay | Admitting: Internal Medicine

## 2022-10-13 ENCOUNTER — Inpatient Hospital Stay: Payer: Medicare PPO | Attending: Hematology

## 2022-10-13 VITALS — BP 146/70 | HR 95 | Temp 97.0°F | Resp 20 | Wt 133.6 lb

## 2022-10-13 DIAGNOSIS — R1310 Dysphagia, unspecified: Secondary | ICD-10-CM

## 2022-10-13 DIAGNOSIS — E1122 Type 2 diabetes mellitus with diabetic chronic kidney disease: Secondary | ICD-10-CM

## 2022-10-13 DIAGNOSIS — Z5112 Encounter for antineoplastic immunotherapy: Secondary | ICD-10-CM | POA: Diagnosis not present

## 2022-10-13 DIAGNOSIS — C9000 Multiple myeloma not having achieved remission: Secondary | ICD-10-CM

## 2022-10-13 DIAGNOSIS — I120 Hypertensive chronic kidney disease with stage 5 chronic kidney disease or end stage renal disease: Secondary | ICD-10-CM

## 2022-10-13 DIAGNOSIS — Z992 Dependence on renal dialysis: Secondary | ICD-10-CM

## 2022-10-13 DIAGNOSIS — R202 Paresthesia of skin: Secondary | ICD-10-CM | POA: Diagnosis not present

## 2022-10-13 DIAGNOSIS — N186 End stage renal disease: Secondary | ICD-10-CM | POA: Diagnosis not present

## 2022-10-13 DIAGNOSIS — I700 Atherosclerosis of aorta: Secondary | ICD-10-CM | POA: Diagnosis not present

## 2022-10-13 LAB — CBC WITH DIFFERENTIAL/PLATELET
Abs Immature Granulocytes: 0.01 10*3/uL (ref 0.00–0.07)
Basophils Absolute: 0 10*3/uL (ref 0.0–0.1)
Basophils Relative: 1 %
Eosinophils Absolute: 0.2 10*3/uL (ref 0.0–0.5)
Eosinophils Relative: 4 %
HCT: 33.8 % — ABNORMAL LOW (ref 36.0–46.0)
Hemoglobin: 10.9 g/dL — ABNORMAL LOW (ref 12.0–15.0)
Immature Granulocytes: 0 %
Lymphocytes Relative: 18 %
Lymphs Abs: 0.9 10*3/uL (ref 0.7–4.0)
MCH: 33.3 pg (ref 26.0–34.0)
MCHC: 32.2 g/dL (ref 30.0–36.0)
MCV: 103.4 fL — ABNORMAL HIGH (ref 80.0–100.0)
Monocytes Absolute: 0.6 10*3/uL (ref 0.1–1.0)
Monocytes Relative: 11 %
Neutro Abs: 3.4 10*3/uL (ref 1.7–7.7)
Neutrophils Relative %: 66 %
Platelets: 246 10*3/uL (ref 150–400)
RBC: 3.27 MIL/uL — ABNORMAL LOW (ref 3.87–5.11)
RDW: 16.3 % — ABNORMAL HIGH (ref 11.5–15.5)
WBC: 5 10*3/uL (ref 4.0–10.5)
nRBC: 0 % (ref 0.0–0.2)

## 2022-10-13 LAB — COMPREHENSIVE METABOLIC PANEL
ALT: 11 U/L (ref 0–44)
AST: 16 U/L (ref 15–41)
Albumin: 3.7 g/dL (ref 3.5–5.0)
Alkaline Phosphatase: 57 U/L (ref 38–126)
Anion gap: 14 (ref 5–15)
BUN: 24 mg/dL — ABNORMAL HIGH (ref 8–23)
CO2: 27 mmol/L (ref 22–32)
Calcium: 9.6 mg/dL (ref 8.9–10.3)
Chloride: 93 mmol/L — ABNORMAL LOW (ref 98–111)
Creatinine, Ser: 5.99 mg/dL — ABNORMAL HIGH (ref 0.44–1.00)
GFR, Estimated: 6 mL/min — ABNORMAL LOW (ref >60)
Glucose, Bld: 139 mg/dL — ABNORMAL HIGH (ref 70–99)
Potassium: 4 mmol/L (ref 3.5–5.1)
Sodium: 134 mmol/L — ABNORMAL LOW (ref 135–145)
Total Bilirubin: 0.3 mg/dL (ref 0.3–1.2)
Total Protein: 7.1 g/dL (ref 6.5–8.1)

## 2022-10-13 LAB — MAGNESIUM: Magnesium: 2.4 mg/dL (ref 1.7–2.4)

## 2022-10-13 MED ORDER — BORTEZOMIB CHEMO SQ INJECTION 3.5 MG (2.5MG/ML)
1.2000 mg/m2 | Freq: Once | INTRAMUSCULAR | Status: AC
  Administered 2022-10-13: 2 mg via SUBCUTANEOUS
  Filled 2022-10-13: qty 0.8

## 2022-10-13 MED ORDER — PROCHLORPERAZINE MALEATE 10 MG PO TABS
10.0000 mg | ORAL_TABLET | Freq: Once | ORAL | Status: AC
  Administered 2022-10-13: 10 mg via ORAL
  Filled 2022-10-13: qty 1

## 2022-10-13 NOTE — Patient Instructions (Signed)
[facility] [place]  Discharge Instructions: Thank you for choosing [facility] to provide your oncology and hematology care.  If you have a lab appointment with the Cancer Center - please note that after April 8th, 2024, all labs will be drawn in the cancer center.  You do not have to check in or register with the main entrance as you have in the past but will complete your check-in in the cancer center.  Wear comfortable clothing and clothing appropriate for easy access to any Portacath or PICC line.   We strive to give you quality time with your provider. You may need to reschedule your appointment if you arrive late (15 or more minutes).  Arriving late affects you and other patients whose appointments are after yours.  Also, if you miss three or more appointments without notifying the office, you may be dismissed from the clinic at the provider's discretion.      For prescription refill requests, have your pharmacy contact our office and allow 72 hours for refills to be completed.    Today you received the following chemotherapy and/or immunotherapy agents Velcade   To help prevent nausea and vomiting after your treatment, we encourage you to take your nausea medication as directed.  Bortezomib Injection What is this medication? BORTEZOMIB (bor TEZ oh mib) treats lymphoma. It may also be used to treat multiple myeloma, a type of bone marrow cancer. It works by blocking a protein that causes cancer cells to grow and multiply. This helps to slow or stop the spread of cancer cells. This medicine may be used for other purposes; ask your health care provider or pharmacist if you have questions. COMMON BRAND NAME(S): Velcade What should I tell my care team before I take this medication? They need to know if you have any of these conditions: Dehydration Diabetes Heart disease Liver disease Tingling of the fingers or toes or other nerve disorder An unusual or  allergic reaction to bortezomib, other medications, foods, dyes, or preservatives If you or your partner are pregnant or trying to get pregnant Breastfeeding How should I use this medication? This medication is injected into a vein or under the skin. It is given by your care team in a hospital or clinic setting. Talk to your care team about the use of this medication in children. Special care may be needed. Overdosage: If you think you have taken too much of this medicine contact a poison control center or emergency room at once. NOTE: This medicine is only for you. Do not share this medicine with others. What if I miss a dose? Keep appointments for follow-up doses. It is important not to miss your dose. Call your care team if you are unable to keep an appointment. What may interact with this medication? Ketoconazole Rifampin This list may not describe all possible interactions. Give your health care provider a list of all the medicines, herbs, non-prescription drugs, or dietary supplements you use. Also tell them if you smoke, drink alcohol, or use illegal drugs. Some items may interact with your medicine. What should I watch for while using this medication? Your condition will be monitored carefully while you are receiving this medication. You may need blood work while taking this medication. This medication may affect your coordination, reaction time, or judgment. Do not drive or operate machinery until you know how this medication affects you. Sit up or stand slowly to reduce the risk of dizzy or fainting spells. Drinking alcohol  with this medication can increase the risk of these side effects. This medication may increase your risk of getting an infection. Call your care team for advice if you get a fever, chills, sore throat, or other symptoms of a cold or flu. Do not treat yourself. Try to avoid being around people who are sick. Check with your care team if you have severe diarrhea, nausea,  and vomiting, or if you sweat a lot. The loss of too much body fluid may make it dangerous for you to take this medication. Talk to your care team if you may be pregnant. Serious birth defects can occur if you take this medication during pregnancy and for 7 months after the last dose. You will need a negative pregnancy test before starting this medication. Contraception is recommended while taking this medication and for 7 months after the last dose. Your care team can help you find the option that works for you. If your partner can get pregnant, use a condom during sex while taking this medication and for 4 months after the last dose. Do not breastfeed while taking this medication and for 2 months after the last dose. This medication may cause infertility. Talk to your care team if you are concerned about your fertility. What side effects may I notice from receiving this medication? Side effects that you should report to your care team as soon as possible: Allergic reactions--skin rash, itching, hives, swelling of the face, lips, tongue, or throat Bleeding--bloody or black, tar-like stools, vomiting blood or brown material that looks like coffee grounds, red or dark brown urine, small red or purple spots on skin, unusual bruising or bleeding Bleeding in the brain--severe headache, stiff neck, confusion, dizziness, change in vision, numbness or weakness of the face, arm, or leg, trouble speaking, trouble walking, vomiting Bowel blockage--stomach cramping, unable to have a bowel movement or pass gas, loss of appetite, vomiting Heart failure--shortness of breath, swelling of the ankles, feet, or hands, sudden weight gain, unusual weakness or fatigue Infection--fever, chills, cough, sore throat, wounds that don't heal, pain or trouble when passing urine, general feeling of discomfort or being unwell Liver injury--right upper belly pain, loss of appetite, nausea, light-colored stool, dark yellow or brown  urine, yellowing skin or eyes, unusual weakness or fatigue Low blood pressure--dizziness, feeling faint or lightheaded, blurry vision Lung injury--shortness of breath or trouble breathing, cough, spitting up blood, chest pain, fever Pain, tingling, or numbness in the hands or feet Severe or prolonged diarrhea Stomach pain, bloody diarrhea, pale skin, unusual weakness or fatigue, decrease in the amount of urine, which may be signs of hemolytic uremic syndrome Sudden and severe headache, confusion, change in vision, seizures, which may be signs of posterior reversible encephalopathy syndrome (PRES) TTP--purple spots on the skin or inside the mouth, pale skin, yellowing skin or eyes, unusual weakness or fatigue, fever, fast or irregular heartbeat, confusion, change in vision, trouble speaking, trouble walking Tumor lysis syndrome (TLS)--nausea, vomiting, diarrhea, decrease in the amount of urine, dark urine, unusual weakness or fatigue, confusion, muscle pain or cramps, fast or irregular heartbeat, joint pain Side effects that usually do not require medical attention (report to your care team if they continue or are bothersome): Constipation Diarrhea Fatigue Loss of appetite Nausea This list may not describe all possible side effects. Call your doctor for medical advice about side effects. You may report side effects to FDA at 1-800-FDA-1088. Where should I keep my medication? This medication is given in a hospital or  clinic. It will not be stored at home. NOTE: This sheet is a summary. It may not cover all possible information. If you have questions about this medicine, talk to your doctor, pharmacist, or health care provider.  2024 Elsevier/Gold Standard (2021-07-29 00:00:00)   BELOW ARE SYMPTOMS THAT SHOULD BE REPORTED IMMEDIATELY: *FEVER GREATER THAN 100.4 F (38 C) OR HIGHER *CHILLS OR SWEATING *NAUSEA AND VOMITING THAT IS NOT CONTROLLED WITH YOUR NAUSEA MEDICATION *UNUSUAL SHORTNESS OF  BREATH *UNUSUAL BRUISING OR BLEEDING *URINARY PROBLEMS (pain or burning when urinating, or frequent urination) *BOWEL PROBLEMS (unusual diarrhea, constipation, pain near the anus) TENDERNESS IN MOUTH AND THROAT WITH OR WITHOUT PRESENCE OF ULCERS (sore throat, sores in mouth, or a toothache) UNUSUAL RASH, SWELLING OR PAIN  UNUSUAL VAGINAL DISCHARGE OR ITCHING   Items with * indicate a potential emergency and should be followed up as soon as possible or go to the Emergency Department if any problems should occur.  Please show the CHEMOTHERAPY ALERT CARD or IMMUNOTHERAPY ALERT CARD at check-in to the Emergency Department and triage nurse.  Should you have questions after your visit or need to cancel or reschedule your appointment, please contact [facility] [phone]  and follow the prompts.  Office hours are 8:00 a.m. to 4:30 p.m. Monday - Friday. Please note that voicemails left after 4:00 p.m. may not be returned until the following business day.  We are closed weekends and major holidays. You have access to a nurse at all times for urgent questions. Please call the main number to the clinic [phone] and follow the prompts.  For any non-urgent questions, you may also contact your provider using MyChart. We now offer e-Visits for anyone 98 and older to request care online for non-urgent symptoms. For details visit [email or web address].   Also download the MyChart app! Go to the app store, search "MyChart", open the app, select [facility], and log in with your MyChart username and password.  [facility] [place]  Discharge Instructions: Thank you for choosing [facility] to provide your oncology and hematology care.  If you have a lab appointment with the Cancer Center - please note that after April 8th, 2024, all labs will be drawn in the cancer center.  You do not have to check in or register with the main entrance as you have in the past  but will complete your check-in in the cancer center.  Wear comfortable clothing and clothing appropriate for easy access to any Portacath or PICC line.   We strive to give you quality time with your provider. You may need to reschedule your appointment if you arrive late (15 or more minutes).  Arriving late affects you and other patients whose appointments are after yours.  Also, if you miss three or more appointments without notifying the office, you may be dismissed from the clinic at the provider's discretion.      For prescription refill requests, have your pharmacy contact our office and allow 72 hours for refills to be completed.

## 2022-10-13 NOTE — Patient Instructions (Signed)
See assessment and plan under each diagnosis in the problem list and acutely for this visit 

## 2022-10-13 NOTE — Assessment & Plan Note (Signed)
She describes mild intermittent dysphagia.  Speech therapy to monitor.

## 2022-10-13 NOTE — Assessment & Plan Note (Signed)
Overall glucoses are well-controlled with the majority between 100 & 135.  A1c was prediabetic at 5.1% on 4/25.  No change indicated.  Continue to monitor.

## 2022-10-13 NOTE — Progress Notes (Signed)
Patient presents today for Velcade infusion. Patient is in satisfactory condition with no new complaints voiced.  Vital signs are stable.  Labs reviewed and all labs are within treatment parameters.  We will proceed with treatment per MD orders.    Treatment given today per MD orders. Tolerated infusion without adverse affects. Vital signs stable. No complaints at this time. Discharged from clinic via wheelchair in stable condition. Alert and oriented x 3. F/U with [facility] as scheduled.

## 2022-10-13 NOTE — Assessment & Plan Note (Signed)
She denies any anginal equivalent at this time.

## 2022-10-13 NOTE — Progress Notes (Unsigned)
NURSING HOME LOCATION:  [facility] ROOM NUMBER:  [id number]  CODE STATUS:  DNR  PCP:  Synthia Innocent NP  This is a nursing facility follow up visit of chronic medical diagnoses & to document compliance with Regulation 483.30 (c) in The Long Term Care Survey Manual Phase 2 which mandates caregiver visit ( visits can alternate among physician, PA or NP as per statutes) within 10 days of 30 days / 60 days/ 90 days post admission to SNF date    Interim medical record and care since last SNF visit was updated with review of diagnostic studies and change in clinical status since last visit were documented.  HPI: She is a permanent resident of this facility with medical diagnoses of anemia due to chronic disease, end-stage renal disease on hemodialysis, dementia, GERD, dyslipidemia, essential hypertension, multiple myeloma, infiltrating ductal carcinoma of the right breast and diabetes with vascular complications. Labs are current as of 10/13/2022.  Creatinine was 5.99 with a GFR of 6.  Mild hyponatremia is present with a sodium of 134.  This is essentially stable.  Serially macrocytic anemia is essentially stable with H/H of 10.9/33.8.  Recent nadir H/H was 9.9/31.  Most recent A1c was prediabetic at 5.1% on 07/02/2022.  Recorded glucoses range from a low of 81 up to a high of 236.  Both are outliers and the majority of the glucoses are between 100 & 135.  TSH was therapeutic at 1.836 on 07/20/2022.  Review of systems: Dementia invalidated responses.  She stated that she was doing "pretty good."  She does state that approximately 4 times a week she will get numbness in her hands at night.  She denies ever being told that she had carpal tunnel syndrome.  She seemed to confabulate about having her arms wrapped.  She does have difficulty reading small print.  She states that she has some intermittent slight dysphagia after drinking fluids.   Constitutional: No fever, significant weight change,  fatigue  Eyes: No redness, discharge, pain, vision change ENT/mouth: No nasal congestion,  purulent discharge, earache, change in hearing, sore throat  Cardiovascular: No chest pain, palpitations, paroxysmal nocturnal dyspnea, claudication, edema  Respiratory: No cough, sputum production, hemoptysis, DOE, significant snoring, apnea   Gastrointestinal: No heartburn, dysphagia, abdominal pain, nausea /vomiting, rectal bleeding, melena, change in bowels Genitourinary: No dysuria, hematuria, pyuria, incontinence, nocturia Musculoskeletal: No joint stiffness, joint swelling, weakness, pain Dermatologic: No rash, pruritus, change in appearance of skin Neurologic: No dizziness, headache, syncope, seizures, numbness, tingling Psychiatric: No significant anxiety, depression, insomnia, anorexia Endocrine: No change in hair/skin/nails, excessive thirst, excessive hunger, excessive urination  Hematologic/lymphatic: No significant bruising, lymphadenopathy, abnormal bleeding Allergy/immunology: No itchy/watery eyes, significant sneezing, urticaria, angioedema  Physical exam:  Pertinent or positive findings: Affect is markedly flat.  She has arcus senilis.  Grade 1 systolic murmur is present at the base.  She has low-grade rales at the bases greater on the left lower lobe than the right.  Abdomen is protuberant.  Pedal pulses are decreased.  She has nonpitting edema of the lower extremities.  Tinel's sign is negative.  DTRs are 0-1/2+. General appearance: Adequately nourished; no acute distress, increased work of breathing is present.   Lymphatic: No lymphadenopathy about the head, neck, axilla. Eyes: No conjunctival inflammation or lid edema is present. There is no scleral icterus. Ears:  External ear exam shows no significant lesions or deformities.   Nose:  External nasal examination shows no deformity or inflammation. Nasal mucosa are  pink and moist without lesions, exudates Oral exam:  Lips and gums are  healthy appearing. There is no oropharyngeal erythema or exudate. Neck:  No thyromegaly, masses, tenderness noted.    Heart:  Normal rate and regular rhythm. S1 and S2 normal without gallop, murmur, click, rub .  Lungs: Chest clear to auscultation without wheezes, rhonchi, rales, rubs. Abdomen: Bowel sounds are normal. Abdomen is soft and nontender with no organomegaly, hernias, masses. GU: Deferred  Extremities:  No cyanosis, clubbing, edema  Neurologic exam : Cn 2-7 intact Strength equal  in upper & lower extremities Balance, Rhomberg, finger to nose testing could not be completed due to clinical state Deep tendon reflexes are equal Skin: Warm & dry w/o tenting. No significant lesions or rash.  See summary under each active problem in the Problem List with associated updated therapeutic plan

## 2022-10-14 DIAGNOSIS — N2581 Secondary hyperparathyroidism of renal origin: Secondary | ICD-10-CM | POA: Diagnosis not present

## 2022-10-14 DIAGNOSIS — N186 End stage renal disease: Secondary | ICD-10-CM | POA: Diagnosis not present

## 2022-10-14 DIAGNOSIS — Z992 Dependence on renal dialysis: Secondary | ICD-10-CM | POA: Diagnosis not present

## 2022-10-16 DIAGNOSIS — N2581 Secondary hyperparathyroidism of renal origin: Secondary | ICD-10-CM | POA: Diagnosis not present

## 2022-10-16 DIAGNOSIS — N186 End stage renal disease: Secondary | ICD-10-CM | POA: Diagnosis not present

## 2022-10-16 DIAGNOSIS — Z992 Dependence on renal dialysis: Secondary | ICD-10-CM | POA: Diagnosis not present

## 2022-10-19 DIAGNOSIS — Z992 Dependence on renal dialysis: Secondary | ICD-10-CM | POA: Diagnosis not present

## 2022-10-19 DIAGNOSIS — N186 End stage renal disease: Secondary | ICD-10-CM | POA: Diagnosis not present

## 2022-10-19 DIAGNOSIS — N2581 Secondary hyperparathyroidism of renal origin: Secondary | ICD-10-CM | POA: Diagnosis not present

## 2022-10-21 DIAGNOSIS — N2581 Secondary hyperparathyroidism of renal origin: Secondary | ICD-10-CM | POA: Diagnosis not present

## 2022-10-21 DIAGNOSIS — Z992 Dependence on renal dialysis: Secondary | ICD-10-CM | POA: Diagnosis not present

## 2022-10-21 DIAGNOSIS — N186 End stage renal disease: Secondary | ICD-10-CM | POA: Diagnosis not present

## 2022-10-23 DIAGNOSIS — Z992 Dependence on renal dialysis: Secondary | ICD-10-CM | POA: Diagnosis not present

## 2022-10-23 DIAGNOSIS — N186 End stage renal disease: Secondary | ICD-10-CM | POA: Diagnosis not present

## 2022-10-23 DIAGNOSIS — N2581 Secondary hyperparathyroidism of renal origin: Secondary | ICD-10-CM | POA: Diagnosis not present

## 2022-10-26 ENCOUNTER — Other Ambulatory Visit: Payer: Self-pay

## 2022-10-26 DIAGNOSIS — N2581 Secondary hyperparathyroidism of renal origin: Secondary | ICD-10-CM | POA: Diagnosis not present

## 2022-10-26 DIAGNOSIS — N186 End stage renal disease: Secondary | ICD-10-CM | POA: Diagnosis not present

## 2022-10-26 DIAGNOSIS — C9000 Multiple myeloma not having achieved remission: Secondary | ICD-10-CM

## 2022-10-26 DIAGNOSIS — Z992 Dependence on renal dialysis: Secondary | ICD-10-CM | POA: Diagnosis not present

## 2022-10-27 ENCOUNTER — Inpatient Hospital Stay: Payer: Medicare PPO

## 2022-10-27 VITALS — BP 152/75 | HR 87 | Temp 98.5°F | Resp 18 | Wt 131.2 lb

## 2022-10-27 DIAGNOSIS — C9000 Multiple myeloma not having achieved remission: Secondary | ICD-10-CM | POA: Diagnosis not present

## 2022-10-27 DIAGNOSIS — Z5112 Encounter for antineoplastic immunotherapy: Secondary | ICD-10-CM | POA: Diagnosis not present

## 2022-10-27 LAB — CBC WITH DIFFERENTIAL/PLATELET
Abs Immature Granulocytes: 0.01 10*3/uL (ref 0.00–0.07)
Basophils Absolute: 0 10*3/uL (ref 0.0–0.1)
Basophils Relative: 1 %
Eosinophils Absolute: 0.2 10*3/uL (ref 0.0–0.5)
Eosinophils Relative: 5 %
HCT: 35 % — ABNORMAL LOW (ref 36.0–46.0)
Hemoglobin: 11.1 g/dL — ABNORMAL LOW (ref 12.0–15.0)
Immature Granulocytes: 0 %
Lymphocytes Relative: 22 %
Lymphs Abs: 0.9 10*3/uL (ref 0.7–4.0)
MCH: 32.9 pg (ref 26.0–34.0)
MCHC: 31.7 g/dL (ref 30.0–36.0)
MCV: 103.9 fL — ABNORMAL HIGH (ref 80.0–100.0)
Monocytes Absolute: 0.5 10*3/uL (ref 0.1–1.0)
Monocytes Relative: 13 %
Neutro Abs: 2.4 10*3/uL (ref 1.7–7.7)
Neutrophils Relative %: 59 %
Platelets: 215 10*3/uL (ref 150–400)
RBC: 3.37 MIL/uL — ABNORMAL LOW (ref 3.87–5.11)
RDW: 16.3 % — ABNORMAL HIGH (ref 11.5–15.5)
WBC: 4.1 10*3/uL (ref 4.0–10.5)
nRBC: 0 % (ref 0.0–0.2)

## 2022-10-27 LAB — COMPREHENSIVE METABOLIC PANEL
ALT: 12 U/L (ref 0–44)
AST: 16 U/L (ref 15–41)
Albumin: 3.7 g/dL (ref 3.5–5.0)
Alkaline Phosphatase: 54 U/L (ref 38–126)
Anion gap: 15 (ref 5–15)
BUN: 24 mg/dL — ABNORMAL HIGH (ref 8–23)
CO2: 25 mmol/L (ref 22–32)
Calcium: 9.2 mg/dL (ref 8.9–10.3)
Chloride: 97 mmol/L — ABNORMAL LOW (ref 98–111)
Creatinine, Ser: 6.55 mg/dL — ABNORMAL HIGH (ref 0.44–1.00)
GFR, Estimated: 6 mL/min — ABNORMAL LOW (ref >60)
Glucose, Bld: 114 mg/dL — ABNORMAL HIGH (ref 70–99)
Potassium: 4.7 mmol/L (ref 3.5–5.1)
Sodium: 137 mmol/L (ref 135–145)
Total Bilirubin: 0.4 mg/dL (ref 0.3–1.2)
Total Protein: 6.8 g/dL (ref 6.5–8.1)

## 2022-10-27 LAB — MAGNESIUM: Magnesium: 2.3 mg/dL (ref 1.7–2.4)

## 2022-10-27 MED ORDER — PROCHLORPERAZINE MALEATE 10 MG PO TABS
10.0000 mg | ORAL_TABLET | Freq: Once | ORAL | Status: AC
  Administered 2022-10-27: 10 mg via ORAL
  Filled 2022-10-27: qty 1

## 2022-10-27 MED ORDER — BORTEZOMIB CHEMO SQ INJECTION 3.5 MG (2.5MG/ML)
1.2000 mg/m2 | Freq: Once | INTRAMUSCULAR | Status: AC
  Administered 2022-10-27: 2 mg via SUBCUTANEOUS
  Filled 2022-10-27: qty 0.8

## 2022-10-27 NOTE — Progress Notes (Signed)
 Patient presents today for Velcade infusion. Patient is in satisfactory condition with no new complaints voiced.  Vital signs are stable.  Labs reviewed and all labs are within treatment parameters.  We will proceed with treatment per MD orders.    Treatment given today per MD orders. Tolerated infusion without adverse affects. Vital signs stable. No complaints at this time. Discharged from clinic via wheelchair in stable condition. Alert and oriented x 3. F/U with [facility] as scheduled.

## 2022-10-27 NOTE — Patient Instructions (Signed)
[facility]  Discharge Instructions: Thank you for choosing [facility] to provide your oncology and hematology care.  If you have a lab appointment with the Cancer Center - please note that after April 8th, 2024, all labs will be drawn in the cancer center.  You do not have to check in or register with the main entrance as you have in the past but will complete your check-in in the cancer center.  Wear comfortable clothing and clothing appropriate for easy access to any Portacath or PICC line.   We strive to give you quality time with your provider. You may need to reschedule your appointment if you arrive late (15 or more minutes).  Arriving late affects you and other patients whose appointments are after yours.  Also, if you miss three or more appointments without notifying the office, you may be dismissed from the clinic at the provider's discretion.      For prescription refill requests, have your pharmacy contact our office and allow 72 hours for refills to be completed.    Today you received the following chemotherapy and/or immunotherapy agents Velcade      To help prevent nausea and vomiting after your treatment, we encourage you to take your nausea medication as directed.  BELOW ARE SYMPTOMS THAT SHOULD BE REPORTED IMMEDIATELY: *FEVER GREATER THAN 100.4 F (38 C) OR HIGHER *CHILLS OR SWEATING *NAUSEA AND VOMITING THAT IS NOT CONTROLLED WITH YOUR NAUSEA MEDICATION *UNUSUAL SHORTNESS OF BREATH *UNUSUAL BRUISING OR BLEEDING *URINARY PROBLEMS (pain or burning when urinating, or frequent urination) *BOWEL PROBLEMS (unusual diarrhea, constipation, pain near the anus) TENDERNESS IN MOUTH AND THROAT WITH OR WITHOUT PRESENCE OF ULCERS (sore throat, sores in mouth, or a toothache) UNUSUAL RASH, SWELLING OR PAIN  UNUSUAL VAGINAL DISCHARGE OR ITCHING   Items with * indicate a potential emergency and should be followed up as soon as possible or go to the  Emergency Department if any problems should occur.  Please show the CHEMOTHERAPY ALERT CARD or IMMUNOTHERAPY ALERT CARD at check-in to the Emergency Department and triage nurse.  Should you have questions after your visit or need to cancel or reschedule your appointment, please contact [facility] [phone]  and follow the prompts.  Office hours are 8:00 a.m. to 4:30 p.m. Monday - Friday. Please note that voicemails left after 4:00 p.m. may not be returned until the following business day.  We are closed weekends and major holidays. You have access to a nurse at all times for urgent questions. Please call the main number to the clinic [phone] and follow the prompts.  For any non-urgent questions, you may also contact your provider using MyChart. We now offer e-Visits for anyone 18 and older to request care online for non-urgent symptoms. For details visit [email or web address].   Also download the MyChart app! Go to the app store, search "MyChart", open the app, select [place], and log in with your MyChart username and password.   

## 2022-10-28 DIAGNOSIS — N186 End stage renal disease: Secondary | ICD-10-CM | POA: Diagnosis not present

## 2022-10-28 DIAGNOSIS — Z992 Dependence on renal dialysis: Secondary | ICD-10-CM | POA: Diagnosis not present

## 2022-10-28 DIAGNOSIS — N2581 Secondary hyperparathyroidism of renal origin: Secondary | ICD-10-CM | POA: Diagnosis not present

## 2022-10-30 DIAGNOSIS — Z992 Dependence on renal dialysis: Secondary | ICD-10-CM | POA: Diagnosis not present

## 2022-10-30 DIAGNOSIS — N2581 Secondary hyperparathyroidism of renal origin: Secondary | ICD-10-CM | POA: Diagnosis not present

## 2022-10-30 DIAGNOSIS — N186 End stage renal disease: Secondary | ICD-10-CM | POA: Diagnosis not present

## 2022-11-02 DIAGNOSIS — N186 End stage renal disease: Secondary | ICD-10-CM | POA: Diagnosis not present

## 2022-11-02 DIAGNOSIS — Z992 Dependence on renal dialysis: Secondary | ICD-10-CM | POA: Diagnosis not present

## 2022-11-02 DIAGNOSIS — N2581 Secondary hyperparathyroidism of renal origin: Secondary | ICD-10-CM | POA: Diagnosis not present

## 2022-11-04 DIAGNOSIS — N2581 Secondary hyperparathyroidism of renal origin: Secondary | ICD-10-CM | POA: Diagnosis not present

## 2022-11-04 DIAGNOSIS — Z992 Dependence on renal dialysis: Secondary | ICD-10-CM | POA: Diagnosis not present

## 2022-11-04 DIAGNOSIS — N186 End stage renal disease: Secondary | ICD-10-CM | POA: Diagnosis not present

## 2022-11-06 DIAGNOSIS — Z992 Dependence on renal dialysis: Secondary | ICD-10-CM | POA: Diagnosis not present

## 2022-11-06 DIAGNOSIS — N2581 Secondary hyperparathyroidism of renal origin: Secondary | ICD-10-CM | POA: Diagnosis not present

## 2022-11-06 DIAGNOSIS — N186 End stage renal disease: Secondary | ICD-10-CM | POA: Diagnosis not present

## 2022-11-08 ENCOUNTER — Other Ambulatory Visit: Payer: Self-pay

## 2022-11-08 DIAGNOSIS — Z992 Dependence on renal dialysis: Secondary | ICD-10-CM | POA: Diagnosis not present

## 2022-11-08 DIAGNOSIS — N186 End stage renal disease: Secondary | ICD-10-CM | POA: Diagnosis not present

## 2022-11-09 DIAGNOSIS — N2581 Secondary hyperparathyroidism of renal origin: Secondary | ICD-10-CM | POA: Diagnosis not present

## 2022-11-09 DIAGNOSIS — Z992 Dependence on renal dialysis: Secondary | ICD-10-CM | POA: Diagnosis not present

## 2022-11-09 DIAGNOSIS — E559 Vitamin D deficiency, unspecified: Secondary | ICD-10-CM | POA: Diagnosis not present

## 2022-11-09 DIAGNOSIS — N186 End stage renal disease: Secondary | ICD-10-CM | POA: Diagnosis not present

## 2022-11-10 ENCOUNTER — Inpatient Hospital Stay: Payer: Medicare PPO | Attending: Hematology

## 2022-11-10 ENCOUNTER — Inpatient Hospital Stay: Payer: Medicare PPO

## 2022-11-10 VITALS — BP 177/74 | HR 82 | Temp 98.4°F | Resp 18 | Wt 135.4 lb

## 2022-11-10 DIAGNOSIS — C9000 Multiple myeloma not having achieved remission: Secondary | ICD-10-CM | POA: Insufficient documentation

## 2022-11-10 DIAGNOSIS — Z79899 Other long term (current) drug therapy: Secondary | ICD-10-CM | POA: Insufficient documentation

## 2022-11-10 DIAGNOSIS — Z5112 Encounter for antineoplastic immunotherapy: Secondary | ICD-10-CM | POA: Diagnosis not present

## 2022-11-10 LAB — CBC WITH DIFFERENTIAL/PLATELET
Abs Immature Granulocytes: 0.01 10*3/uL (ref 0.00–0.07)
Basophils Absolute: 0 10*3/uL (ref 0.0–0.1)
Basophils Relative: 1 %
Eosinophils Absolute: 0.2 10*3/uL (ref 0.0–0.5)
Eosinophils Relative: 4 %
HCT: 33.4 % — ABNORMAL LOW (ref 36.0–46.0)
Hemoglobin: 10.7 g/dL — ABNORMAL LOW (ref 12.0–15.0)
Immature Granulocytes: 0 %
Lymphocytes Relative: 20 %
Lymphs Abs: 0.8 10*3/uL (ref 0.7–4.0)
MCH: 32.7 pg (ref 26.0–34.0)
MCHC: 32 g/dL (ref 30.0–36.0)
MCV: 102.1 fL — ABNORMAL HIGH (ref 80.0–100.0)
Monocytes Absolute: 0.5 10*3/uL (ref 0.1–1.0)
Monocytes Relative: 11 %
Neutro Abs: 2.5 10*3/uL (ref 1.7–7.7)
Neutrophils Relative %: 64 %
Platelets: 187 10*3/uL (ref 150–400)
RBC: 3.27 MIL/uL — ABNORMAL LOW (ref 3.87–5.11)
RDW: 15.3 % (ref 11.5–15.5)
WBC: 4 10*3/uL (ref 4.0–10.5)
nRBC: 0 % (ref 0.0–0.2)

## 2022-11-10 LAB — COMPREHENSIVE METABOLIC PANEL
ALT: 12 U/L (ref 0–44)
AST: 18 U/L (ref 15–41)
Albumin: 3.6 g/dL (ref 3.5–5.0)
Alkaline Phosphatase: 57 U/L (ref 38–126)
Anion gap: 13 (ref 5–15)
BUN: 28 mg/dL — ABNORMAL HIGH (ref 8–23)
CO2: 25 mmol/L (ref 22–32)
Calcium: 9 mg/dL (ref 8.9–10.3)
Chloride: 92 mmol/L — ABNORMAL LOW (ref 98–111)
Creatinine, Ser: 6.16 mg/dL — ABNORMAL HIGH (ref 0.44–1.00)
GFR, Estimated: 6 mL/min — ABNORMAL LOW (ref >60)
Glucose, Bld: 110 mg/dL — ABNORMAL HIGH (ref 70–99)
Potassium: 4.8 mmol/L (ref 3.5–5.1)
Sodium: 130 mmol/L — ABNORMAL LOW (ref 135–145)
Total Bilirubin: 0.5 mg/dL (ref 0.3–1.2)
Total Protein: 6.8 g/dL (ref 6.5–8.1)

## 2022-11-10 LAB — MAGNESIUM: Magnesium: 2.4 mg/dL (ref 1.7–2.4)

## 2022-11-10 MED ORDER — BORTEZOMIB CHEMO SQ INJECTION 3.5 MG (2.5MG/ML)
1.2000 mg/m2 | Freq: Once | INTRAMUSCULAR | Status: AC
  Administered 2022-11-10: 2 mg via SUBCUTANEOUS
  Filled 2022-11-10: qty 0.8

## 2022-11-10 MED ORDER — PROCHLORPERAZINE MALEATE 10 MG PO TABS
10.0000 mg | ORAL_TABLET | Freq: Once | ORAL | Status: AC
  Administered 2022-11-10: 10 mg via ORAL
  Filled 2022-11-10: qty 1

## 2022-11-10 NOTE — Patient Instructions (Signed)
[facility]  Discharge Instructions: Thank you for choosing [facility] to provide your oncology and hematology care.  If you have a lab appointment with the Cancer Center - please note that after April 8th, 2024, all labs will be drawn in the cancer center.  You do not have to check in or register with the main entrance as you have in the past but will complete your check-in in the cancer center.  Wear comfortable clothing and clothing appropriate for easy access to any Portacath or PICC line.   We strive to give you quality time with your provider. You may need to reschedule your appointment if you arrive late (15 or more minutes).  Arriving late affects you and other patients whose appointments are after yours.  Also, if you miss three or more appointments without notifying the office, you may be dismissed from the clinic at the provider's discretion.      For prescription refill requests, have your pharmacy contact our office and allow 72 hours for refills to be completed.    Today you received the following chemotherapy and/or immunotherapy agents Velcade      To help prevent nausea and vomiting after your treatment, we encourage you to take your nausea medication as directed.  BELOW ARE SYMPTOMS THAT SHOULD BE REPORTED IMMEDIATELY: *FEVER GREATER THAN 100.4 F (38 C) OR HIGHER *CHILLS OR SWEATING *NAUSEA AND VOMITING THAT IS NOT CONTROLLED WITH YOUR NAUSEA MEDICATION *UNUSUAL SHORTNESS OF BREATH *UNUSUAL BRUISING OR BLEEDING *URINARY PROBLEMS (pain or burning when urinating, or frequent urination) *BOWEL PROBLEMS (unusual diarrhea, constipation, pain near the anus) TENDERNESS IN MOUTH AND THROAT WITH OR WITHOUT PRESENCE OF ULCERS (sore throat, sores in mouth, or a toothache) UNUSUAL RASH, SWELLING OR PAIN  UNUSUAL VAGINAL DISCHARGE OR ITCHING   Items with * indicate a potential emergency and should be followed up as soon as possible or go to the  Emergency Department if any problems should occur.  Please show the CHEMOTHERAPY ALERT CARD or IMMUNOTHERAPY ALERT CARD at check-in to the Emergency Department and triage nurse.  Should you have questions after your visit or need to cancel or reschedule your appointment, please contact [facility] [phone]  and follow the prompts.  Office hours are 8:00 a.m. to 4:30 p.m. Monday - Friday. Please note that voicemails left after 4:00 p.m. may not be returned until the following business day.  We are closed weekends and major holidays. You have access to a nurse at all times for urgent questions. Please call the main number to the clinic [phone] and follow the prompts.  For any non-urgent questions, you may also contact your provider using MyChart. We now offer e-Visits for anyone 18 and older to request care online for non-urgent symptoms. For details visit [email or web address].   Also download the MyChart app! Go to the app store, search "MyChart", open the app, select [place], and log in with your MyChart username and password.   

## 2022-11-10 NOTE — Progress Notes (Signed)
Patient presents today for Velcade injection per providers order.  Vital signs and labs within parameters for treatment.  Creatine elevated okay to treat per MD order.  Patient has no new complaints at this time.  Treatment given today per MD orders.  Stable during infusion without adverse affects.  Vital signs stable.  No complaints at this time.  Discharge from clinic ambulatory in stable condition.  Alert and oriented X 3.  Follow up with [facility] as scheduled.

## 2022-11-11 DIAGNOSIS — N2581 Secondary hyperparathyroidism of renal origin: Secondary | ICD-10-CM | POA: Diagnosis not present

## 2022-11-11 DIAGNOSIS — Z992 Dependence on renal dialysis: Secondary | ICD-10-CM | POA: Diagnosis not present

## 2022-11-11 DIAGNOSIS — N186 End stage renal disease: Secondary | ICD-10-CM | POA: Diagnosis not present

## 2022-11-11 DIAGNOSIS — E559 Vitamin D deficiency, unspecified: Secondary | ICD-10-CM | POA: Diagnosis not present

## 2022-11-13 DIAGNOSIS — N2581 Secondary hyperparathyroidism of renal origin: Secondary | ICD-10-CM | POA: Diagnosis not present

## 2022-11-13 DIAGNOSIS — E559 Vitamin D deficiency, unspecified: Secondary | ICD-10-CM | POA: Diagnosis not present

## 2022-11-13 DIAGNOSIS — N186 End stage renal disease: Secondary | ICD-10-CM | POA: Diagnosis not present

## 2022-11-13 DIAGNOSIS — Z992 Dependence on renal dialysis: Secondary | ICD-10-CM | POA: Diagnosis not present

## 2022-11-16 DIAGNOSIS — N2581 Secondary hyperparathyroidism of renal origin: Secondary | ICD-10-CM | POA: Diagnosis not present

## 2022-11-16 DIAGNOSIS — Z992 Dependence on renal dialysis: Secondary | ICD-10-CM | POA: Diagnosis not present

## 2022-11-16 DIAGNOSIS — E559 Vitamin D deficiency, unspecified: Secondary | ICD-10-CM | POA: Diagnosis not present

## 2022-11-16 DIAGNOSIS — N186 End stage renal disease: Secondary | ICD-10-CM | POA: Diagnosis not present

## 2022-11-18 DIAGNOSIS — Z992 Dependence on renal dialysis: Secondary | ICD-10-CM | POA: Diagnosis not present

## 2022-11-18 DIAGNOSIS — N2581 Secondary hyperparathyroidism of renal origin: Secondary | ICD-10-CM | POA: Diagnosis not present

## 2022-11-18 DIAGNOSIS — N186 End stage renal disease: Secondary | ICD-10-CM | POA: Diagnosis not present

## 2022-11-18 DIAGNOSIS — E559 Vitamin D deficiency, unspecified: Secondary | ICD-10-CM | POA: Diagnosis not present

## 2022-11-20 ENCOUNTER — Non-Acute Institutional Stay (SKILLED_NURSING_FACILITY): Payer: Medicare PPO | Admitting: Adult Health

## 2022-11-20 ENCOUNTER — Encounter: Payer: Self-pay | Admitting: Adult Health

## 2022-11-20 DIAGNOSIS — N2581 Secondary hyperparathyroidism of renal origin: Secondary | ICD-10-CM | POA: Diagnosis not present

## 2022-11-20 DIAGNOSIS — E559 Vitamin D deficiency, unspecified: Secondary | ICD-10-CM | POA: Diagnosis not present

## 2022-11-20 DIAGNOSIS — N186 End stage renal disease: Secondary | ICD-10-CM

## 2022-11-20 DIAGNOSIS — E1122 Type 2 diabetes mellitus with diabetic chronic kidney disease: Secondary | ICD-10-CM | POA: Diagnosis not present

## 2022-11-20 DIAGNOSIS — Z992 Dependence on renal dialysis: Secondary | ICD-10-CM | POA: Diagnosis not present

## 2022-11-20 DIAGNOSIS — M810 Age-related osteoporosis without current pathological fracture: Secondary | ICD-10-CM

## 2022-11-20 DIAGNOSIS — D631 Anemia in chronic kidney disease: Secondary | ICD-10-CM

## 2022-11-20 NOTE — Progress Notes (Signed)
Location:  [facility] Room Number: 148 Place of Service:  SNF (31)   CODE STATUS: dnr   Allergies  Allergen Reactions   Ace Inhibitors Other (See Comments) and Cough    Tongue swell , ie angioedema   Angiotensin Receptor Blockers     Angioedema with ACE-I   Penicillins Other (See Comments)    Unsure of reaction Has patient had a PCN reaction causing immediate rash, facial/tongue/throat swelling, SOB or lightheadedness with hypotension: Unknown Has patient had a PCN reaction causing severe rash involving mucus membranes or skin necrosis: Unknown Has patient had a PCN reaction that required hospitalization: No Has patient had a PCN reaction occurring within the last 10 years: Unknown If all of the above answers are "NO", then may proceed with Cephalosporin use.     Penicillin G     Chief Complaint  Patient presents with   Medical Management of Chronic Issues          Diabetes mellitus type 2 with end stage renal disease (ESRD)  Anemia due to end stage renal disease;     Post menopausal osteoporosis     HPI:  She is a 87 year old long term resident of this facility being seen for the management of her chronic illnesses:  Diabetes mellitus type 2 with end stage renal disease (ESRD)  Anemia due to end stage renal disease;     Post menopausal osteoporosis. Her sister has recently died. There have been no reports of anxiety or depressive thoughts.   Past Medical History:  Diagnosis Date   Anemia     chronic macrocytic anemia   Anxiety    Chronic kidney disease    Chronic renal disease, stage 4, severely decreased glomerular filtration rate (GFR) between 15-29 mL/min/1.73 square meter (HCC) 08/22/2015   Complication of anesthesia    delirious after Breast Surgery   Dementia (HCC)    mild   Depression    Diabetes mellitus with ESRD (end-stage renal disease) (HCC)    type II   Dysphagia    Dyspnea    with activity   GERD (gastroesophageal reflux  disease)    Glaucoma    Hyperlipidemia    Hypertension    Multiple myeloma (HCC)    Pneumonia    Stage 1 infiltrating ductal carcinoma of right female breast (HCC) 08/21/2015   ER+ PR+ HER 2 neu + (3+) T1cN0     Past Surgical History:  Procedure Laterality Date   A/V FISTULAGRAM Right 07/05/2020   Procedure: A/V Fistulagram;  Surgeon: Sherren Kerns, MD;  [facility]: [facility];  Service: Cardiovascular;  Laterality: Right;   AV FISTULA PLACEMENT Left 11/22/2017   Procedure: ARTERIOVENOUS (AV) FISTULA CREATION LEFT ARM;  Surgeon: Sherren Kerns, MD;  Location: [facility] OR;  Service: Vascular;  Laterality: Left;   AV FISTULA PLACEMENT Right 04/04/2020   Procedure: RIGHT ARM ARTERIOVENOUS FISTULA CREATION;  Surgeon: Larina Earthly, MD;  Location: [facility];  Service: Vascular;  Laterality: Right;   AV FISTULA PLACEMENT Right 08/20/2020   Procedure: ARTERIOVENOUS (AV) FISTULA LIGATION RIGHT ARM;  Surgeon: Sherren Kerns, MD;  Location: [facility] OR;  Service: Vascular;  Laterality: Right;   BIOPSY  08/07/2016   Procedure: BIOPSY;  Surgeon: Corbin Ade, MD;  Location: [facility];  Service: Endoscopy;;  gastric ulcer biopsy   COLONOSCOPY     ESOPHAGOGASTRODUODENOSCOPY N/A 08/07/2016   LA Grade A esophagitis s/p dilation, small hiatal hernia, multiple  gastric ulcers and erosions, duodenal erosions s/p biopsy. Negative H.pylori    ESOPHAGOGASTRODUODENOSCOPY N/A 11/27/2016   normal esophagus, previously noted gastric ulcers completely healed, normal duodenum.    ESOPHAGOGASTRODUODENOSCOPY (EGD) WITH PROPOFOL N/A 07/23/2020   Procedure: ESOPHAGOGASTRODUODENOSCOPY (EGD) WITH PROPOFOL;  Surgeon: Malissa Hippo, MD;  [facility]: [facility];  Service: Endoscopy;  Laterality: N/A;   FISTULA SUPERFICIALIZATION Left 02/14/2018   Procedure: FISTULA SUPERFICIALIZATION LEFT ARM;  Surgeon: Chuck Hint, MD;  Location: [facility] OR;  Service: Vascular;  Laterality: Left;   FRACTURE SURGERY Right     ankle   HOT HEMOSTASIS  07/23/2020   Procedure: HOT HEMOSTASIS (ARGON PLASMA COAGULATION/BICAP);  Surgeon: Malissa Hippo, MD;  [facility]: [facility];  Service: Endoscopy;;   INTRAMEDULLARY (IM) NAIL INTERTROCHANTERIC Right 07/12/2020   Procedure: INTRAMEDULLARY (IM) NAIL INTERTROCHANTRIC;  Surgeon: Oliver Barre, MD;  Location: [facility];  Service: Orthopedics;  Laterality: Right;   MALONEY DILATION N/A 08/07/2016   Procedure: Elease Hashimoto DILATION;  Surgeon: Corbin Ade, MD;  [facility]: [facility];  Service: Endoscopy;  Laterality: N/A;   MASTECTOMY, PARTIAL Right    PERIPHERAL VASCULAR BALLOON ANGIOPLASTY Left 07/13/2019   Procedure: PERIPHERAL VASCULAR BALLOON ANGIOPLASTY;  Surgeon: Cephus Shelling, MD;  [facility]: [facility];  Service: Cardiovascular;  Laterality: Left;  arm fistulogram   PERIPHERAL VASCULAR BALLOON ANGIOPLASTY Right 05/22/2020   Procedure: PERIPHERAL VASCULAR BALLOON ANGIOPLASTY;  Surgeon: Leonie Douglas, MD;  [facility]: [facility];  Service: Cardiovascular;  Laterality: Right;  arm fistula   PERIPHERAL VASCULAR BALLOON ANGIOPLASTY Right 07/05/2020   Procedure: PERIPHERAL VASCULAR BALLOON ANGIOPLASTY;  Surgeon: Sherren Kerns, MD;  [facility]: [facility];  Service: Cardiovascular;  Laterality: Right;  arm fistula   PORT-A-CATH REMOVAL Left 11/22/2017   Procedure: REMOVAL PORT-A-CATH LEFT CHEST;  Surgeon: Sherren Kerns, MD;  Location: [facility] OR;  Service: Vascular;  Laterality: Left;   RETINAL DETACHMENT SURGERY Right    SCLEROTHERAPY  07/23/2020   Procedure: SCLEROTHERAPY;  Surgeon: Malissa Hippo, MD;  [facility]: [facility];  Service: Endoscopy;;   UPPER EXTREMITY VENOGRAPHY N/A 08/22/2020   Procedure: LEFT UPPER & CENTRAL VENOGRAPHY;  Surgeon: Cephus Shelling, MD;  [facility]: [facility];  Service: Cardiovascular;  Laterality: N/A;    Social History   Socioeconomic History   Marital status: Single    Spouse name:  Not on file   Number of children: Not on file   Years of education: Not on file   Highest education level: Not on file  Occupational History   Occupation: retired   Tobacco Use   Smoking status: Never   Smokeless tobacco: Never  Vaping Use   Vaping status: Never Used  Substance and Sexual Activity   Alcohol use: No    Alcohol/week: 0.0 standard drinks of alcohol   Drug use: No   Sexual activity: Never  Other Topics Concern   Not on file  Social History Narrative   Long term resident of SNF    Social Determinants of Health   Financial Resource Strain: Low Risk  (01/09/2020)   Overall Financial Resource Strain (CARDIA)    Difficulty of Paying Living Expenses: Not very hard  Food Insecurity: No Food Insecurity (01/09/2020)   Hunger Vital Sign    Worried About Running Out of Food in the Last Year: Never true    Ran Out of Food in the Last Year: Never true  Transportation Needs: No Transportation Needs (01/09/2020)  PRAPARE - Administrator, Civil Service (Medical): No    Lack of Transportation (Non-Medical): No  Physical Activity: Inactive (01/09/2020)   Exercise Vital Sign    Days of Exercise per Week: 0 days    Minutes of Exercise per Session: 0 min  Stress: No Stress Concern Present (01/09/2020)   [employer] of Occupational Health - Occupational Stress Questionnaire    Feeling of Stress : Not at all  Social Connections: Moderately Isolated (01/09/2020)   Social Connection and Isolation Panel [NHANES]    Frequency of Communication with Friends and Family: More than three times a week    Frequency of Social Gatherings with Friends and Family: Once a week    Attends Religious Services: More than 4 times per year    Active Member of [employer] or Organizations: No    Attends Banker Meetings: Never    Marital Status: Never married  Intimate Partner Violence: Not At Risk (01/09/2020)   Humiliation, Afraid, Rape, and Kick questionnaire    Fear of  Current or Ex-Partner: No    Emotionally Abused: No    Physically Abused: No    Sexually Abused: No   Family History  Problem Relation Age of Onset   Multiple myeloma Sister    Brain cancer Sister    Dementia Mother        died at 27   Stroke Mother    Heart failure Mother    Diabetes Mother    Heart disease Father    Prostate cancer Brother    Colon cancer Neg Hx       VITAL SIGNS BP 136/66   Pulse 86   Temp 97.9 F (36.6 C)   Resp 18   Ht 5\' 3"  (1.6 m)   Wt 143 lb (64.9 kg)   SpO2 97%   BMI 25.33 kg/m   Outpatient Encounter Medications as of 11/20/2022  Medication Sig   acetaminophen (TYLENOL) 325 MG tablet Take 650 mg by mouth 3 (three) times daily.   acyclovir (ZOVIRAX) 200 MG capsule Take 200 mg by mouth in the morning. (0800)   anastrozole (ARIMIDEX) 1 MG tablet TAKE 1 TABLET BY MOUTH DAILY   calcium-vitamin D (OSCAL WITH D) 500-200 MG-UNIT tablet Take 1 tablet by mouth in the morning. (0800)   Insulin Pen Needle (BD AUTOSHIELD DUO) 30G X 5 MM MISC by Does not apply route. 3/16"   melatonin 3 MG TABS tablet Take 6 mg by mouth at bedtime. (2000) For Sleep   multivitamin (RENA-VIT) TABS tablet Take 1 tablet by mouth at bedtime. (2100)   Nutritional Supplements (ENSURE PLUS PO) Take 1 Can by mouth daily. 8pm   ondansetron (ZOFRAN) 4 MG tablet Take 1 tablet (4 mg total) by mouth every 6 (six) hours as needed for nausea.   ondansetron (ZOFRAN-ODT) 4 MG disintegrating tablet Take 4 mg by mouth daily.   pantoprazole (PROTONIX) 40 MG tablet Take 40 mg by mouth 2 (two) times daily.   sennosides-docusate sodium (SENOKOT-S) 8.6-50 MG tablet Take 1 tablet by mouth 2 (two) times daily.   sertraline (ZOLOFT) 50 MG tablet Take 50 mg by mouth daily.   sucralfate (CARAFATE) 1 GM/10ML suspension Take 1 g by mouth in the morning, at noon, in the evening, and at bedtime. (0800, 1200, 1700 & 2100)   UNABLE TO FIND Dysphagia 3 Diet   Facility-Administered Encounter Medications as of  11/20/2022  Medication   lanreotide acetate (SOMATULINE DEPOT) 120 MG/0.5ML injection  lanreotide acetate (SOMATULINE DEPOT) 120 MG/0.5ML injection   lanreotide acetate (SOMATULINE DEPOT) 120 MG/0.5ML injection   lanreotide acetate (SOMATULINE DEPOT) 120 MG/0.5ML injection   octreotide (SANDOSTATIN LAR) 30 MG IM injection   octreotide (SANDOSTATIN LAR) 30 MG IM injection     SIGNIFICANT DIAGNOSTIC EXAMS  PREVIOUS  10-24-21: DEXA: t score -2.957  NO NEW EXAMS    LABS REVIEWED PREVIOUS     11-25-21: wbc 4.9; hgb 9.4; hct 30.7; mcv 100.7 plt 237; glucose 171; bun 27; creat 6.88; k+ 4.4; na++ 137; ca 9.0; gfr 6; protein 6.4 albumin 3.5  12-16-21: wbc 5.3; hgb 10.1; hct 33.0 mcv 98.5 plt 256; glucose 119; bun 24; creat 6.62; k+ 4.2 na++ 136; ca 9.3 gfr 6; protein 6.4 albumin 3.5; iron 54; tibc 140; ferritin 272  02-03-22: wbc 3.8; hgb 11.5; hct 37.6; mcv 96.4 plt 212; glucose 164; bun 28; creat 7.99; k+ 4.4; na++ 139; ca 8.3 gfr 5; protein 6.7 albumin 3.7 mag 2.1 02-17-22: wbc 4.6; hgb 11.5; hct 36.5; mcv 93.6 plt 207; glucose 95; bun 23 creat 7.44 k+ 4.1; na++ 135; ca 7.8; gfr 5 protein 6.9; albumin 3.8  03-31-22: wbc 4.7; hgb 10.0; hct 32.2; mcv 96.4 plt 278; glucose 155; bun 28; creat 6.50; k+ 4.0; na++ 132; ca 8.0; gfr 6 protein 6.9 albumin 3.6 05-19-22: wbc 4.1; hgb 9.6; hct 30.9; mcv 98.7 plt 236; glucose 170; bun 33; creat 6.03; k+ 3.9; na++ 137; ca 8.1; gfr 6; protein 6.6 albumin 3.4; mag 2.1  06-23-22: wbc 4.9; hgb 9.1; hct 28.7 mcv 101.4 plt 286; glucose 158; bun 25; creat 5.85; k+ 3.9 na++ 136; ca 8.6 gfr 7 protein 6.8 albumin 3.3 mag 2.2 07-02-22: hgb A1c 5.1 07-07-22: wbc 4.9; hgb 10.3; hct 32.8; mcv 102.5 plt 265; glucose 141; bun 32; creat 6.15; k+ 4.3; na++ 136; ca 8.9; gfr 6 protein 7.2 albumin 3.7; mag 2.3 07-20-22: vitamin B 12: 660; tsh 1.836 08-04-22: wbc 4.6; hgb 10.7; ht 33.1; mcv 100.3 plt 234; glucose 120; bun 41; creat 6.46; k+ 4.2; na++ 135; ca 9.8; gfr 6; protein 7.3;  albumin 3.7 mag 2.4  LABS REVIEWED:   09-15-22: wbc 5.2; hgb 9.9; hct 31.0; mcv 103.3 plt 224; glucose 132; bun 22; creat 6.19; k+ 4.2; na++ 135; ca 9.5 gfr 6 protein 6.9 albumin 3.7     11-10-22: wbc 4.0; hgb 10.7; hct 33.4; mcv 102.1 plt 187; glucose 110; bun 28; creat 6.16; k+ 4.8; na++ 130; ca 9.0; gfr 6; protein 6.8 albumin 3.6 mag 2.4   Review of Systems  Constitutional:  Negative for malaise/fatigue.  Respiratory:  Negative for cough and shortness of breath.   Cardiovascular:  Negative for chest pain, palpitations and leg swelling.  Gastrointestinal:  Negative for abdominal pain, constipation and heartburn.  Musculoskeletal:  Negative for back pain, joint pain and myalgias.  Skin: Negative.   Neurological:  Negative for dizziness.  Psychiatric/Behavioral:  The patient is not nervous/anxious.    Physical Exam Constitutional:      General: She is not in acute distress.    Appearance: She is well-developed. She is not diaphoretic.  Neck:     Thyroid: No thyromegaly.  Cardiovascular:     Rate and Rhythm: Normal rate and regular rhythm.     Pulses: Normal pulses.     Heart sounds: Normal heart sounds.  Pulmonary:     Effort: Pulmonary effort is normal. No respiratory distress.     Breath sounds: Normal breath sounds.  Abdominal:  General: Bowel sounds are normal. There is no distension.     Palpations: Abdomen is soft.     Tenderness: There is no abdominal tenderness.  Musculoskeletal:     Cervical back: Neck supple.     Right lower leg: No edema.     Left lower leg: No edema.     Comments: Able to move all extremities    Lymphadenopathy:     Cervical: No cervical adenopathy.  Skin:    General: Skin is warm and dry.     Comments:  Chest wall dialysis access  Neurological:     Mental Status: She is alert. Mental status is at baseline.  Psychiatric:        Mood and Affect: Mood normal.             ASSESSMENT/PLAN  TODAY  Diabetes mellitus type 2 with end stage  renal disease (ESRD) hgb A1c 5.1 is currently off medications  2. Anemia due to end stage renal disease; hgb 10.7  3. Post menopausal osteoporosis: t score -2.957 will continue supplements   PREVIOUS   4. Stage 1 infiltrating ductal carcinoma of female right breast/status post right mastectomy will continue arimidex 1 mg daily  5. Multiple myeloma not having achieved remission: is followed by oncology    6. Herpes: without outbreak will continue acyclovir 200 mg daily   7. Major depression chronic recurrent: will continue zoloft 50 mg daily  8. Protein calorie malnutrition: protein 6.8 albumin 3.6 will continue supplements and prostat with meals.   9. Multiple gastric ulcers/duodenal ulcer/upper GI bleed/esophageal reflux disease without esophagitis: will continue protonix 40 mg twice daily carafate 1 gm with before meals and bedtime. Zofran 4 mg daily and every 6 hours as needed    10. Generalized chronic pain: will continue tylenol 650 mg three times daily   11. Chronic kidney disease with end stage renal disease on dialysis due to type 2 diabetes mellitus/dependence on dialysis: will continue dialysis three days per week: 1200 cc fluid restriction; calcium acetate 1334 mg with meals  12. Hypertension associated with end stage renal disease (ESRD) due to type 2 diabetes mellitus: b/p 136/66 will monitor   13. Hyperlipidemia associated with type 2 diabetes mellitus: ldl 89; is off lipitor; has elevated liver enzymes.   14. Aortic atherosclerosis/atherosclerotic peripheral vascular disease (ct 10-04-18) ldl 89  15. Vascular dementia with behavioral disturbance: weight is 143 pounds and stable.      Synthia Innocent NP [facility]  call [phone]

## 2022-11-23 ENCOUNTER — Other Ambulatory Visit (HOSPITAL_COMMUNITY)
Admission: RE | Admit: 2022-11-23 | Discharge: 2022-11-23 | Disposition: A | Discharge status: Home / Self Care | Payer: Medicare PPO | Source: Skilled Nursing Facility | Attending: Adult Health | Admitting: Adult Health

## 2022-11-23 DIAGNOSIS — Z992 Dependence on renal dialysis: Secondary | ICD-10-CM | POA: Diagnosis not present

## 2022-11-23 DIAGNOSIS — E559 Vitamin D deficiency, unspecified: Secondary | ICD-10-CM | POA: Diagnosis not present

## 2022-11-23 DIAGNOSIS — E1122 Type 2 diabetes mellitus with diabetic chronic kidney disease: Secondary | ICD-10-CM | POA: Insufficient documentation

## 2022-11-23 DIAGNOSIS — N186 End stage renal disease: Secondary | ICD-10-CM | POA: Diagnosis not present

## 2022-11-23 DIAGNOSIS — N2581 Secondary hyperparathyroidism of renal origin: Secondary | ICD-10-CM | POA: Diagnosis not present

## 2022-11-23 LAB — LIPID PANEL
Cholesterol: 192 mg/dL (ref 0–200)
HDL: 45 mg/dL (ref >40)
LDL Cholesterol: 115 mg/dL — ABNORMAL HIGH (ref 0–99)
Total CHOL/HDL Ratio: 4.3 ratio
Triglycerides: 158 mg/dL — ABNORMAL HIGH (ref <150)
VLDL: 32 mg/dL (ref 0–40)

## 2022-11-23 LAB — HEMOGLOBIN A1C
Hgb A1c MFr Bld: 5.2 % (ref 4.8–5.6)
Mean Plasma Glucose: 103 mg/dL

## 2022-11-24 ENCOUNTER — Inpatient Hospital Stay: Payer: Medicare PPO

## 2022-11-24 ENCOUNTER — Other Ambulatory Visit: Payer: Self-pay

## 2022-11-24 VITALS — BP 168/93 | HR 87 | Temp 98.0°F | Resp 20 | Wt 140.0 lb

## 2022-11-24 DIAGNOSIS — Z79899 Other long term (current) drug therapy: Secondary | ICD-10-CM | POA: Diagnosis not present

## 2022-11-24 DIAGNOSIS — Z5112 Encounter for antineoplastic immunotherapy: Secondary | ICD-10-CM | POA: Diagnosis not present

## 2022-11-24 DIAGNOSIS — C9000 Multiple myeloma not having achieved remission: Secondary | ICD-10-CM

## 2022-11-24 LAB — CBC WITH DIFFERENTIAL/PLATELET
Abs Immature Granulocytes: 0.01 10*3/uL (ref 0.00–0.07)
Basophils Absolute: 0 10*3/uL (ref 0.0–0.1)
Basophils Relative: 1 %
Eosinophils Absolute: 0.2 10*3/uL (ref 0.0–0.5)
Eosinophils Relative: 5 %
HCT: 34.3 % — ABNORMAL LOW (ref 36.0–46.0)
Hemoglobin: 11.1 g/dL — ABNORMAL LOW (ref 12.0–15.0)
Immature Granulocytes: 0 %
Lymphocytes Relative: 19 %
Lymphs Abs: 0.9 10*3/uL (ref 0.7–4.0)
MCH: 33.1 pg (ref 26.0–34.0)
MCHC: 32.4 g/dL (ref 30.0–36.0)
MCV: 102.4 fL — ABNORMAL HIGH (ref 80.0–100.0)
Monocytes Absolute: 0.5 10*3/uL (ref 0.1–1.0)
Monocytes Relative: 11 %
Neutro Abs: 2.9 10*3/uL (ref 1.7–7.7)
Neutrophils Relative %: 64 %
Platelets: 180 10*3/uL (ref 150–400)
RBC: 3.35 MIL/uL — ABNORMAL LOW (ref 3.87–5.11)
RDW: 15.9 % — ABNORMAL HIGH (ref 11.5–15.5)
WBC: 4.5 10*3/uL (ref 4.0–10.5)
nRBC: 0 % (ref 0.0–0.2)

## 2022-11-24 LAB — COMPREHENSIVE METABOLIC PANEL
ALT: 12 U/L (ref 0–44)
AST: 16 U/L (ref 15–41)
Albumin: 3.6 g/dL (ref 3.5–5.0)
Alkaline Phosphatase: 65 U/L (ref 38–126)
Anion gap: 14 (ref 5–15)
BUN: 28 mg/dL — ABNORMAL HIGH (ref 8–23)
CO2: 24 mmol/L (ref 22–32)
Calcium: 9.2 mg/dL (ref 8.9–10.3)
Chloride: 93 mmol/L — ABNORMAL LOW (ref 98–111)
Creatinine, Ser: 5.99 mg/dL — ABNORMAL HIGH (ref 0.44–1.00)
GFR, Estimated: 6 mL/min — ABNORMAL LOW (ref >60)
Glucose, Bld: 120 mg/dL — ABNORMAL HIGH (ref 70–99)
Potassium: 5 mmol/L (ref 3.5–5.1)
Sodium: 131 mmol/L — ABNORMAL LOW (ref 135–145)
Total Bilirubin: 0.4 mg/dL (ref 0.3–1.2)
Total Protein: 6.9 g/dL (ref 6.5–8.1)

## 2022-11-24 LAB — MAGNESIUM: Magnesium: 2.4 mg/dL (ref 1.7–2.4)

## 2022-11-24 MED ORDER — PROCHLORPERAZINE MALEATE 10 MG PO TABS
10.0000 mg | ORAL_TABLET | Freq: Once | ORAL | Status: AC
  Administered 2022-11-24: 10 mg via ORAL
  Filled 2022-11-24: qty 1

## 2022-11-24 MED ORDER — BORTEZOMIB CHEMO SQ INJECTION 3.5 MG (2.5MG/ML)
1.2000 mg/m2 | Freq: Once | INTRAMUSCULAR | Status: AC
  Administered 2022-11-24: 2 mg via SUBCUTANEOUS
  Filled 2022-11-24: qty 0.8

## 2022-11-24 NOTE — Progress Notes (Signed)
Patient presents today for Velcade injection. Labs within parameters for treatment. Nursing communication. Disregard creatinine. Dialysis patient. Creatinine 5.99. Blood pressure 168/93. Asymptomatic. Vital signs within parameters for treatment.   Velcade given today per MD orders. Tolerated without adverse affects. Vital signs stable. No complaints at this time. Discharged from clinic by wheel chair in stable condition. Alert and oriented x 3. F/U with [facility] as scheduled.

## 2022-11-24 NOTE — Patient Instructions (Signed)
[facility]  Discharge Instructions: Thank you for choosing [facility] to provide your oncology and hematology care.  If you have a lab appointment with the Cancer Center - please note that after April 8th, 2024, all labs will be drawn in the cancer center.  You do not have to check in or register with the main entrance as you have in the past but will complete your check-in in the cancer center.  Wear comfortable clothing and clothing appropriate for easy access to any Portacath or PICC line.   We strive to give you quality time with your provider. You may need to reschedule your appointment if you arrive late (15 or more minutes).  Arriving late affects you and other patients whose appointments are after yours.  Also, if you miss three or more appointments without notifying the office, you may be dismissed from the clinic at the provider's discretion.      For prescription refill requests, have your pharmacy contact our office and allow 72 hours for refills to be completed.    Today you received the following chemotherapy and/or immunotherapy agents Velcade. Bortezomib Injection What is this medication? BORTEZOMIB (bor TEZ oh mib) treats lymphoma. It may also be used to treat multiple myeloma, a type of bone marrow cancer. It works by blocking a protein that causes cancer cells to grow and multiply. This helps to slow or stop the spread of cancer cells. This medicine may be used for other purposes; ask your health care provider or pharmacist if you have questions. COMMON BRAND NAME(S): Velcade What should I tell my care team before I take this medication? They need to know if you have any of these conditions: Dehydration Diabetes Heart disease Liver disease Tingling of the fingers or toes or other nerve disorder An unusual or allergic reaction to bortezomib, other medications, foods, dyes, or preservatives If you or your partner are pregnant or trying to  get pregnant Breastfeeding How should I use this medication? This medication is injected into a vein or under the skin. It is given by your care team in a hospital or clinic setting. Talk to your care team about the use of this medication in children. Special care may be needed. Overdosage: If you think you have taken too much of this medicine contact a poison control center or emergency room at once. NOTE: This medicine is only for you. Do not share this medicine with others. What if I miss a dose? Keep appointments for follow-up doses. It is important not to miss your dose. Call your care team if you are unable to keep an appointment. What may interact with this medication? Ketoconazole Rifampin This list may not describe all possible interactions. Give your health care provider a list of all the medicines, herbs, non-prescription drugs, or dietary supplements you use. Also tell them if you smoke, drink alcohol, or use illegal drugs. Some items may interact with your medicine. What should I watch for while using this medication? Your condition will be monitored carefully while you are receiving this medication. You may need blood work while taking this medication. This medication may affect your coordination, reaction time, or judgment. Do not drive or operate machinery until you know how this medication affects you. Sit up or stand slowly to reduce the risk of dizzy or fainting spells. Drinking alcohol with this medication can increase the risk of these side effects. This medication may increase your risk of getting an infection. Call  your care team for advice if you get a fever, chills, sore throat, or other symptoms of a cold or flu. Do not treat yourself. Try to avoid being around people who are sick. Check with your care team if you have severe diarrhea, nausea, and vomiting, or if you sweat a lot. The loss of too much body fluid may make it dangerous for you to take this medication. Talk to  your care team if you may be pregnant. Serious birth defects can occur if you take this medication during pregnancy and for 7 months after the last dose. You will need a negative pregnancy test before starting this medication. Contraception is recommended while taking this medication and for 7 months after the last dose. Your care team can help you find the option that works for you. If your partner can get pregnant, use a condom during sex while taking this medication and for 4 months after the last dose. Do not breastfeed while taking this medication and for 2 months after the last dose. This medication may cause infertility. Talk to your care team if you are concerned about your fertility. What side effects may I notice from receiving this medication? Side effects that you should report to your care team as soon as possible: Allergic reactions--skin rash, itching, hives, swelling of the face, lips, tongue, or throat Bleeding--bloody or black, tar-like stools, vomiting blood or brown material that looks like coffee grounds, red or dark brown urine, small red or purple spots on skin, unusual bruising or bleeding Bleeding in the brain--severe headache, stiff neck, confusion, dizziness, change in vision, numbness or weakness of the face, arm, or leg, trouble speaking, trouble walking, vomiting Bowel blockage--stomach cramping, unable to have a bowel movement or pass gas, loss of appetite, vomiting Heart failure--shortness of breath, swelling of the ankles, feet, or hands, sudden weight gain, unusual weakness or fatigue Infection--fever, chills, cough, sore throat, wounds that don't heal, pain or trouble when passing urine, general feeling of discomfort or being unwell Liver injury--right upper belly pain, loss of appetite, nausea, light-colored stool, dark yellow or brown urine, yellowing skin or eyes, unusual weakness or fatigue Low blood pressure--dizziness, feeling faint or lightheaded, blurry  vision Lung injury--shortness of breath or trouble breathing, cough, spitting up blood, chest pain, fever Pain, tingling, or numbness in the hands or feet Severe or prolonged diarrhea Stomach pain, bloody diarrhea, pale skin, unusual weakness or fatigue, decrease in the amount of urine, which may be signs of hemolytic uremic syndrome Sudden and severe headache, confusion, change in vision, seizures, which may be signs of posterior reversible encephalopathy syndrome (PRES) TTP--purple spots on the skin or inside the mouth, pale skin, yellowing skin or eyes, unusual weakness or fatigue, fever, fast or irregular heartbeat, confusion, change in vision, trouble speaking, trouble walking Tumor lysis syndrome (TLS)--nausea, vomiting, diarrhea, decrease in the amount of urine, dark urine, unusual weakness or fatigue, confusion, muscle pain or cramps, fast or irregular heartbeat, joint pain Side effects that usually do not require medical attention (report to your care team if they continue or are bothersome): Constipation Diarrhea Fatigue Loss of appetite Nausea This list may not describe all possible side effects. Call your doctor for medical advice about side effects. You may report side effects to FDA at 1-800-FDA-1088. Where should I keep my medication? This medication is given in a hospital or clinic. It will not be stored at home. NOTE: This sheet is a summary. It may not cover all possible information. If  you have questions about this medicine, talk to your doctor, pharmacist, or health care provider.  2024 Elsevier/Gold Standard (2021-07-29 00:00:00)       To help prevent nausea and vomiting after your treatment, we encourage you to take your nausea medication as directed.  BELOW ARE SYMPTOMS THAT SHOULD BE REPORTED IMMEDIATELY: *FEVER GREATER THAN 100.4 F (38 C) OR HIGHER *CHILLS OR SWEATING *NAUSEA AND VOMITING THAT IS NOT CONTROLLED WITH YOUR NAUSEA MEDICATION *UNUSUAL SHORTNESS OF  BREATH *UNUSUAL BRUISING OR BLEEDING *URINARY PROBLEMS (pain or burning when urinating, or frequent urination) *BOWEL PROBLEMS (unusual diarrhea, constipation, pain near the anus) TENDERNESS IN MOUTH AND THROAT WITH OR WITHOUT PRESENCE OF ULCERS (sore throat, sores in mouth, or a toothache) UNUSUAL RASH, SWELLING OR PAIN  UNUSUAL VAGINAL DISCHARGE OR ITCHING   Items with * indicate a potential emergency and should be followed up as soon as possible or go to the Emergency Department if any problems should occur.  Please show the CHEMOTHERAPY ALERT CARD or IMMUNOTHERAPY ALERT CARD at check-in to the Emergency Department and triage nurse.  Should you have questions after your visit or need to cancel or reschedule your appointment, please contact [facility] [phone]  and follow the prompts.  Office hours are 8:00 a.m. to 4:30 p.m. Monday - Friday. Please note that voicemails left after 4:00 p.m. may not be returned until the following business day.  We are closed weekends and major holidays. You have access to a nurse at all times for urgent questions. Please call the main number to the clinic [phone] and follow the prompts.  For any non-urgent questions, you may also contact your provider using MyChart. We now offer e-Visits for anyone 31 and older to request care online for non-urgent symptoms. For details visit [email or web address].   Also download the MyChart app! Go to the app store, search "MyChart", open the app, select [facility], and log in with your MyChart username and password.

## 2022-11-25 DIAGNOSIS — Z992 Dependence on renal dialysis: Secondary | ICD-10-CM | POA: Diagnosis not present

## 2022-11-25 DIAGNOSIS — N2581 Secondary hyperparathyroidism of renal origin: Secondary | ICD-10-CM | POA: Diagnosis not present

## 2022-11-25 DIAGNOSIS — E559 Vitamin D deficiency, unspecified: Secondary | ICD-10-CM | POA: Diagnosis not present

## 2022-11-25 DIAGNOSIS — N186 End stage renal disease: Secondary | ICD-10-CM | POA: Diagnosis not present

## 2022-11-27 DIAGNOSIS — E559 Vitamin D deficiency, unspecified: Secondary | ICD-10-CM | POA: Diagnosis not present

## 2022-11-27 DIAGNOSIS — Z992 Dependence on renal dialysis: Secondary | ICD-10-CM | POA: Diagnosis not present

## 2022-11-27 DIAGNOSIS — N2581 Secondary hyperparathyroidism of renal origin: Secondary | ICD-10-CM | POA: Diagnosis not present

## 2022-11-27 DIAGNOSIS — N186 End stage renal disease: Secondary | ICD-10-CM | POA: Diagnosis not present

## 2022-11-30 DIAGNOSIS — N186 End stage renal disease: Secondary | ICD-10-CM | POA: Diagnosis not present

## 2022-11-30 DIAGNOSIS — N2581 Secondary hyperparathyroidism of renal origin: Secondary | ICD-10-CM | POA: Diagnosis not present

## 2022-11-30 DIAGNOSIS — Z992 Dependence on renal dialysis: Secondary | ICD-10-CM | POA: Diagnosis not present

## 2022-11-30 DIAGNOSIS — E559 Vitamin D deficiency, unspecified: Secondary | ICD-10-CM | POA: Diagnosis not present

## 2022-12-02 DIAGNOSIS — N2581 Secondary hyperparathyroidism of renal origin: Secondary | ICD-10-CM | POA: Diagnosis not present

## 2022-12-02 DIAGNOSIS — N186 End stage renal disease: Secondary | ICD-10-CM | POA: Diagnosis not present

## 2022-12-02 DIAGNOSIS — Z992 Dependence on renal dialysis: Secondary | ICD-10-CM | POA: Diagnosis not present

## 2022-12-02 DIAGNOSIS — E559 Vitamin D deficiency, unspecified: Secondary | ICD-10-CM | POA: Diagnosis not present

## 2022-12-04 DIAGNOSIS — N2581 Secondary hyperparathyroidism of renal origin: Secondary | ICD-10-CM | POA: Diagnosis not present

## 2022-12-04 DIAGNOSIS — N186 End stage renal disease: Secondary | ICD-10-CM | POA: Diagnosis not present

## 2022-12-04 DIAGNOSIS — Z992 Dependence on renal dialysis: Secondary | ICD-10-CM | POA: Diagnosis not present

## 2022-12-04 DIAGNOSIS — E559 Vitamin D deficiency, unspecified: Secondary | ICD-10-CM | POA: Diagnosis not present

## 2022-12-07 DIAGNOSIS — E559 Vitamin D deficiency, unspecified: Secondary | ICD-10-CM | POA: Diagnosis not present

## 2022-12-07 DIAGNOSIS — Z992 Dependence on renal dialysis: Secondary | ICD-10-CM | POA: Diagnosis not present

## 2022-12-07 DIAGNOSIS — N2581 Secondary hyperparathyroidism of renal origin: Secondary | ICD-10-CM | POA: Diagnosis not present

## 2022-12-07 DIAGNOSIS — N186 End stage renal disease: Secondary | ICD-10-CM | POA: Diagnosis not present

## 2022-12-08 ENCOUNTER — Inpatient Hospital Stay: Payer: Medicare PPO

## 2022-12-08 ENCOUNTER — Other Ambulatory Visit: Payer: Self-pay

## 2022-12-08 DIAGNOSIS — N186 End stage renal disease: Secondary | ICD-10-CM | POA: Diagnosis not present

## 2022-12-08 DIAGNOSIS — Z992 Dependence on renal dialysis: Secondary | ICD-10-CM | POA: Diagnosis not present

## 2022-12-09 DIAGNOSIS — B9689 Other specified bacterial agents as the cause of diseases classified elsewhere: Secondary | ICD-10-CM | POA: Diagnosis not present

## 2022-12-09 DIAGNOSIS — Z992 Dependence on renal dialysis: Secondary | ICD-10-CM | POA: Diagnosis not present

## 2022-12-09 DIAGNOSIS — N2581 Secondary hyperparathyroidism of renal origin: Secondary | ICD-10-CM | POA: Diagnosis not present

## 2022-12-09 DIAGNOSIS — T80212A Local infection due to central venous catheter, initial encounter: Secondary | ICD-10-CM | POA: Diagnosis not present

## 2022-12-09 DIAGNOSIS — N186 End stage renal disease: Secondary | ICD-10-CM | POA: Diagnosis not present

## 2022-12-11 DIAGNOSIS — N2581 Secondary hyperparathyroidism of renal origin: Secondary | ICD-10-CM | POA: Diagnosis not present

## 2022-12-11 DIAGNOSIS — Z992 Dependence on renal dialysis: Secondary | ICD-10-CM | POA: Diagnosis not present

## 2022-12-11 DIAGNOSIS — N186 End stage renal disease: Secondary | ICD-10-CM | POA: Diagnosis not present

## 2022-12-11 DIAGNOSIS — B9689 Other specified bacterial agents as the cause of diseases classified elsewhere: Secondary | ICD-10-CM | POA: Diagnosis not present

## 2022-12-11 DIAGNOSIS — T80212A Local infection due to central venous catheter, initial encounter: Secondary | ICD-10-CM | POA: Diagnosis not present

## 2022-12-14 DIAGNOSIS — N186 End stage renal disease: Secondary | ICD-10-CM | POA: Diagnosis not present

## 2022-12-14 DIAGNOSIS — N2581 Secondary hyperparathyroidism of renal origin: Secondary | ICD-10-CM | POA: Diagnosis not present

## 2022-12-14 DIAGNOSIS — Z992 Dependence on renal dialysis: Secondary | ICD-10-CM | POA: Diagnosis not present

## 2022-12-14 DIAGNOSIS — T80212A Local infection due to central venous catheter, initial encounter: Secondary | ICD-10-CM | POA: Diagnosis not present

## 2022-12-14 DIAGNOSIS — B9689 Other specified bacterial agents as the cause of diseases classified elsewhere: Secondary | ICD-10-CM | POA: Diagnosis not present

## 2022-12-15 DIAGNOSIS — Z23 Encounter for immunization: Secondary | ICD-10-CM | POA: Diagnosis not present

## 2022-12-16 ENCOUNTER — Emergency Department (HOSPITAL_COMMUNITY): Payer: Medicare PPO

## 2022-12-16 ENCOUNTER — Inpatient Hospital Stay (HOSPITAL_COMMUNITY)
Admission: EM | Admit: 2022-12-16 | Discharge: 2022-12-22 | DRG: 314 | Disposition: A | Discharge status: Skilled Nursing Facility | Payer: Medicare PPO | Source: Skilled Nursing Facility | Attending: Family Medicine | Admitting: Family Medicine

## 2022-12-16 ENCOUNTER — Other Ambulatory Visit: Payer: Self-pay

## 2022-12-16 DIAGNOSIS — E1169 Type 2 diabetes mellitus with other specified complication: Secondary | ICD-10-CM | POA: Diagnosis present

## 2022-12-16 DIAGNOSIS — Z8042 Family history of malignant neoplasm of prostate: Secondary | ICD-10-CM

## 2022-12-16 DIAGNOSIS — R0689 Other abnormalities of breathing: Secondary | ICD-10-CM | POA: Diagnosis not present

## 2022-12-16 DIAGNOSIS — C9000 Multiple myeloma not having achieved remission: Secondary | ICD-10-CM | POA: Diagnosis not present

## 2022-12-16 DIAGNOSIS — E878 Other disorders of electrolyte and fluid balance, not elsewhere classified: Secondary | ICD-10-CM | POA: Diagnosis present

## 2022-12-16 DIAGNOSIS — A419 Sepsis, unspecified organism: Secondary | ICD-10-CM | POA: Diagnosis not present

## 2022-12-16 DIAGNOSIS — Z880 Allergy status to penicillin: Secondary | ICD-10-CM

## 2022-12-16 DIAGNOSIS — I120 Hypertensive chronic kidney disease with stage 5 chronic kidney disease or end stage renal disease: Secondary | ICD-10-CM | POA: Diagnosis present

## 2022-12-16 DIAGNOSIS — T80211A Bloodstream infection due to central venous catheter, initial encounter: Secondary | ICD-10-CM | POA: Diagnosis not present

## 2022-12-16 DIAGNOSIS — Z1152 Encounter for screening for COVID-19: Secondary | ICD-10-CM | POA: Diagnosis not present

## 2022-12-16 DIAGNOSIS — D6481 Anemia due to antineoplastic chemotherapy: Secondary | ICD-10-CM | POA: Diagnosis not present

## 2022-12-16 DIAGNOSIS — Z79811 Long term (current) use of aromatase inhibitors: Secondary | ICD-10-CM

## 2022-12-16 DIAGNOSIS — K219 Gastro-esophageal reflux disease without esophagitis: Secondary | ICD-10-CM | POA: Diagnosis present

## 2022-12-16 DIAGNOSIS — N186 End stage renal disease: Secondary | ICD-10-CM | POA: Diagnosis not present

## 2022-12-16 DIAGNOSIS — Z833 Family history of diabetes mellitus: Secondary | ICD-10-CM | POA: Diagnosis not present

## 2022-12-16 DIAGNOSIS — E1122 Type 2 diabetes mellitus with diabetic chronic kidney disease: Secondary | ICD-10-CM | POA: Diagnosis present

## 2022-12-16 DIAGNOSIS — T80212A Local infection due to central venous catheter, initial encounter: Secondary | ICD-10-CM | POA: Diagnosis not present

## 2022-12-16 DIAGNOSIS — D696 Thrombocytopenia, unspecified: Secondary | ICD-10-CM | POA: Diagnosis not present

## 2022-12-16 DIAGNOSIS — Z992 Dependence on renal dialysis: Secondary | ICD-10-CM | POA: Diagnosis not present

## 2022-12-16 DIAGNOSIS — R6510 Systemic inflammatory response syndrome (SIRS) of non-infectious origin without acute organ dysfunction: Secondary | ICD-10-CM | POA: Diagnosis not present

## 2022-12-16 DIAGNOSIS — C50919 Malignant neoplasm of unspecified site of unspecified female breast: Secondary | ICD-10-CM | POA: Diagnosis present

## 2022-12-16 DIAGNOSIS — E871 Hypo-osmolality and hyponatremia: Secondary | ICD-10-CM | POA: Diagnosis present

## 2022-12-16 DIAGNOSIS — B9689 Other specified bacterial agents as the cause of diseases classified elsewhere: Secondary | ICD-10-CM | POA: Diagnosis not present

## 2022-12-16 DIAGNOSIS — N250 Renal osteodystrophy: Secondary | ICD-10-CM | POA: Diagnosis not present

## 2022-12-16 DIAGNOSIS — E785 Hyperlipidemia, unspecified: Secondary | ICD-10-CM | POA: Diagnosis present

## 2022-12-16 DIAGNOSIS — Z1721 Progesterone receptor positive status: Secondary | ICD-10-CM

## 2022-12-16 DIAGNOSIS — C50111 Malignant neoplasm of central portion of right female breast: Secondary | ICD-10-CM

## 2022-12-16 DIAGNOSIS — R059 Cough, unspecified: Secondary | ICD-10-CM | POA: Diagnosis not present

## 2022-12-16 DIAGNOSIS — Z853 Personal history of malignant neoplasm of breast: Secondary | ICD-10-CM | POA: Diagnosis not present

## 2022-12-16 DIAGNOSIS — Z452 Encounter for adjustment and management of vascular access device: Secondary | ICD-10-CM | POA: Diagnosis not present

## 2022-12-16 DIAGNOSIS — R918 Other nonspecific abnormal finding of lung field: Secondary | ICD-10-CM | POA: Diagnosis not present

## 2022-12-16 DIAGNOSIS — I700 Atherosclerosis of aorta: Secondary | ICD-10-CM | POA: Diagnosis present

## 2022-12-16 DIAGNOSIS — N2581 Secondary hyperparathyroidism of renal origin: Secondary | ICD-10-CM | POA: Diagnosis not present

## 2022-12-16 DIAGNOSIS — Z9011 Acquired absence of right breast and nipple: Secondary | ICD-10-CM

## 2022-12-16 DIAGNOSIS — R4182 Altered mental status, unspecified: Secondary | ICD-10-CM | POA: Diagnosis not present

## 2022-12-16 DIAGNOSIS — D631 Anemia in chronic kidney disease: Secondary | ICD-10-CM | POA: Diagnosis not present

## 2022-12-16 DIAGNOSIS — Y832 Surgical operation with anastomosis, bypass or graft as the cause of abnormal reaction of the patient, or of later complication, without mention of misadventure at the time of the procedure: Secondary | ICD-10-CM | POA: Diagnosis present

## 2022-12-16 DIAGNOSIS — Z807 Family history of other malignant neoplasms of lymphoid, hematopoietic and related tissues: Secondary | ICD-10-CM

## 2022-12-16 DIAGNOSIS — Z66 Do not resuscitate: Secondary | ICD-10-CM | POA: Diagnosis present

## 2022-12-16 DIAGNOSIS — D539 Nutritional anemia, unspecified: Secondary | ICD-10-CM | POA: Diagnosis present

## 2022-12-16 DIAGNOSIS — Z8249 Family history of ischemic heart disease and other diseases of the circulatory system: Secondary | ICD-10-CM

## 2022-12-16 DIAGNOSIS — Z79899 Other long term (current) drug therapy: Secondary | ICD-10-CM

## 2022-12-16 DIAGNOSIS — T829XXA Unspecified complication of cardiac and vascular prosthetic device, implant and graft, initial encounter: Secondary | ICD-10-CM | POA: Diagnosis not present

## 2022-12-16 DIAGNOSIS — F0150 Vascular dementia without behavioral disturbance: Secondary | ICD-10-CM | POA: Diagnosis present

## 2022-12-16 DIAGNOSIS — I152 Hypertension secondary to endocrine disorders: Secondary | ICD-10-CM | POA: Diagnosis not present

## 2022-12-16 DIAGNOSIS — Z823 Family history of stroke: Secondary | ICD-10-CM

## 2022-12-16 DIAGNOSIS — R7881 Bacteremia: Secondary | ICD-10-CM | POA: Diagnosis not present

## 2022-12-16 DIAGNOSIS — Z170 Estrogen receptor positive status [ER+]: Secondary | ICD-10-CM | POA: Diagnosis not present

## 2022-12-16 DIAGNOSIS — A411 Sepsis due to other specified staphylococcus: Secondary | ICD-10-CM | POA: Diagnosis present

## 2022-12-16 DIAGNOSIS — K269 Duodenal ulcer, unspecified as acute or chronic, without hemorrhage or perforation: Secondary | ICD-10-CM | POA: Diagnosis not present

## 2022-12-16 DIAGNOSIS — R509 Fever, unspecified: Secondary | ICD-10-CM | POA: Diagnosis not present

## 2022-12-16 DIAGNOSIS — R000 Tachycardia, unspecified: Secondary | ICD-10-CM | POA: Diagnosis not present

## 2022-12-16 DIAGNOSIS — Z808 Family history of malignant neoplasm of other organs or systems: Secondary | ICD-10-CM

## 2022-12-16 LAB — CBC WITH DIFFERENTIAL/PLATELET
Abs Immature Granulocytes: 0.02 10*3/uL (ref 0.00–0.07)
Basophils Absolute: 0 10*3/uL (ref 0.0–0.1)
Basophils Relative: 0 %
Eosinophils Absolute: 0 10*3/uL (ref 0.0–0.5)
Eosinophils Relative: 1 %
HCT: 38.3 % (ref 36.0–46.0)
Hemoglobin: 12.4 g/dL (ref 12.0–15.0)
Immature Granulocytes: 0 %
Lymphocytes Relative: 5 %
Lymphs Abs: 0.3 10*3/uL — ABNORMAL LOW (ref 0.7–4.0)
MCH: 32.8 pg (ref 26.0–34.0)
MCHC: 32.4 g/dL (ref 30.0–36.0)
MCV: 101.3 fL — ABNORMAL HIGH (ref 80.0–100.0)
Monocytes Absolute: 0.4 10*3/uL (ref 0.1–1.0)
Monocytes Relative: 6 %
Neutro Abs: 5.9 10*3/uL (ref 1.7–7.7)
Neutrophils Relative %: 88 %
Platelets: 201 10*3/uL (ref 150–400)
RBC: 3.78 MIL/uL — ABNORMAL LOW (ref 3.87–5.11)
RDW: 15.6 % — ABNORMAL HIGH (ref 11.5–15.5)
WBC: 6.7 10*3/uL (ref 4.0–10.5)
nRBC: 0 % (ref 0.0–0.2)

## 2022-12-16 LAB — COMPREHENSIVE METABOLIC PANEL
ALT: 14 U/L (ref 0–44)
AST: 18 U/L (ref 15–41)
Albumin: 4.1 g/dL (ref 3.5–5.0)
Alkaline Phosphatase: 80 U/L (ref 38–126)
Anion gap: 16 — ABNORMAL HIGH (ref 5–15)
BUN: 22 mg/dL (ref 8–23)
CO2: 24 mmol/L (ref 22–32)
Calcium: 9.4 mg/dL (ref 8.9–10.3)
Chloride: 93 mmol/L — ABNORMAL LOW (ref 98–111)
Creatinine, Ser: 4.81 mg/dL — ABNORMAL HIGH (ref 0.44–1.00)
GFR, Estimated: 8 mL/min — ABNORMAL LOW (ref >60)
Glucose, Bld: 123 mg/dL — ABNORMAL HIGH (ref 70–99)
Potassium: 4.1 mmol/L (ref 3.5–5.1)
Sodium: 133 mmol/L — ABNORMAL LOW (ref 135–145)
Total Bilirubin: 0.6 mg/dL (ref 0.3–1.2)
Total Protein: 7.9 g/dL (ref 6.5–8.1)

## 2022-12-16 LAB — RESP PANEL BY RT-PCR (RSV, FLU A&B, COVID)  RVPGX2
Influenza A by PCR: NEGATIVE
Influenza B by PCR: NEGATIVE
Resp Syncytial Virus by PCR: NEGATIVE
SARS Coronavirus 2 by RT PCR: NEGATIVE

## 2022-12-16 LAB — APTT: aPTT: 29 s (ref 24–36)

## 2022-12-16 LAB — LIPASE, BLOOD: Lipase: 82 U/L — ABNORMAL HIGH (ref 11–51)

## 2022-12-16 LAB — CBG MONITORING, ED: Glucose-Capillary: 169 mg/dL — ABNORMAL HIGH (ref 70–99)

## 2022-12-16 LAB — TROPONIN I (HIGH SENSITIVITY)
Troponin I (High Sensitivity): 58 ng/L — ABNORMAL HIGH (ref <18)
Troponin I (High Sensitivity): 54 ng/L — ABNORMAL HIGH (ref <18)

## 2022-12-16 LAB — LACTIC ACID, PLASMA
Lactic Acid, Venous: 1.4 mmol/L (ref 0.5–1.9)
Lactic Acid, Venous: 1.1 mmol/L (ref 0.5–1.9)

## 2022-12-16 LAB — PROTIME-INR
INR: 1.1 (ref 0.8–1.2)
Prothrombin Time: 14.1 s (ref 11.4–15.2)

## 2022-12-16 MED ORDER — MELATONIN 3 MG PO TABS
6.0000 mg | ORAL_TABLET | Freq: Every day | ORAL | Status: DC
  Administered 2022-12-16 – 2022-12-21 (×6): 6 mg via ORAL
  Filled 2022-12-16 (×6): qty 2

## 2022-12-16 MED ORDER — ACETAMINOPHEN 650 MG RE SUPP: 650.0000 mg | Freq: Four times a day (QID) | RECTAL | Status: DC | PRN

## 2022-12-16 MED ORDER — LEVOFLOXACIN IN D5W 250 MG/50ML IV SOLN: 250.0000 mg | INTRAVENOUS | Status: DC

## 2022-12-16 MED ORDER — ACETAMINOPHEN 325 MG PO TABS
650.0000 mg | ORAL_TABLET | Freq: Four times a day (QID) | ORAL | Status: DC | PRN
  Administered 2022-12-19 (×2): 650 mg via ORAL
  Filled 2022-12-16 (×2): qty 2

## 2022-12-16 MED ORDER — SODIUM CHLORIDE 0.9 % IV SOLN
2.0000 g | Freq: Once | INTRAVENOUS | Status: AC
  Administered 2022-12-16: 2 g via INTRAVENOUS
  Filled 2022-12-16: qty 12.5

## 2022-12-16 MED ORDER — HEPARIN SODIUM (PORCINE) 5000 UNIT/ML IJ SOLN
5000.0000 [IU] | Freq: Three times a day (TID) | INTRAMUSCULAR | Status: DC
  Administered 2022-12-16 – 2022-12-17 (×2): 5000 [IU] via SUBCUTANEOUS
  Filled 2022-12-16 (×2): qty 1

## 2022-12-16 MED ORDER — SODIUM CHLORIDE 0.9 % IV SOLN
1.0000 g | INTRAVENOUS | Status: DC
  Filled 2022-12-16: qty 10

## 2022-12-16 MED ORDER — LEVOFLOXACIN IN D5W 750 MG/150ML IV SOLN
750.0000 mg | Freq: Once | INTRAVENOUS | Status: AC
  Administered 2022-12-16: 750 mg via INTRAVENOUS
  Filled 2022-12-16: qty 150

## 2022-12-16 MED ORDER — VANCOMYCIN HCL IN DEXTROSE 1-5 GM/200ML-% IV SOLN
1000.0000 mg | Freq: Once | INTRAVENOUS | Status: AC
  Administered 2022-12-16: 1000 mg via INTRAVENOUS
  Filled 2022-12-16: qty 200

## 2022-12-16 MED ORDER — VANCOMYCIN VARIABLE DOSE PER UNSTABLE RENAL FUNCTION (PHARMACIST DOSING): Status: DC

## 2022-12-16 MED ORDER — ANASTROZOLE 1 MG PO TABS
1.0000 mg | ORAL_TABLET | Freq: Every day | ORAL | Status: DC
  Administered 2022-12-17 – 2022-12-22 (×6): 1 mg via ORAL
  Filled 2022-12-16 (×7): qty 1

## 2022-12-16 MED ORDER — METRONIDAZOLE 500 MG/100ML IV SOLN
500.0000 mg | Freq: Two times a day (BID) | INTRAVENOUS | Status: DC
  Administered 2022-12-17: 500 mg via INTRAVENOUS

## 2022-12-16 MED ORDER — ONDANSETRON HCL 4 MG/2ML IJ SOLN: 4.0000 mg | Freq: Four times a day (QID) | INTRAMUSCULAR | Status: DC | PRN

## 2022-12-16 MED ORDER — ACYCLOVIR 200 MG PO CAPS
200.0000 mg | ORAL_CAPSULE | Freq: Every morning | ORAL | Status: DC
  Administered 2022-12-17 – 2022-12-22 (×6): 200 mg via ORAL
  Filled 2022-12-16 (×7): qty 1

## 2022-12-16 MED ORDER — HYDRALAZINE HCL 20 MG/ML IJ SOLN: 5.0000 mg | INTRAMUSCULAR | Status: DC | PRN

## 2022-12-16 MED ORDER — ALBUTEROL SULFATE (2.5 MG/3ML) 0.083% IN NEBU: 2.5000 mg | INHALATION_SOLUTION | RESPIRATORY_TRACT | Status: DC | PRN

## 2022-12-16 MED ORDER — ONDANSETRON HCL 4 MG PO TABS: 4.0000 mg | ORAL_TABLET | Freq: Four times a day (QID) | ORAL | Status: DC | PRN

## 2022-12-16 NOTE — ED Notes (Signed)
Dr Randol Kern at bedside. B Aadvika Konen RN

## 2022-12-16 NOTE — ED Notes (Signed)
   12/16/22 2237  ED Care Handoff  Report Handoff to: ED to IP SBAR updated and communication sent to IP RN  Report given to / Additional Comments: Sherrie RN  Sepsis Handoff review complete? Yes  Transport Method Stretcher     12/16/22 2237  ED Care Handoff  Report Handoff to: ED to IP SBAR updated and communication sent to IP RN  Report given to / Additional Comments: Sherrie RN  Sepsis Handoff review complete? Yes  Transport Method Stretcher     12/16/22 2237  ED Care Handoff  Report Handoff to: ED to IP SBAR updated and communication sent to IP RN  Report given to / Additional Comments: Sherrie RN  Sepsis Handoff review complete? Yes  Transport Method [employer]

## 2022-12-16 NOTE — Progress Notes (Deleted)
Pharmacy Antibiotic Note  Carly Wood is a 87 y.o. female admitted on 12/16/2022 with sepsis.  Pharmacy has been consulted for vancomycin dosing. PMH includes HD where she became unresponsive after completing dialysis on 10/9.  WBC 6.7, sCr 4.81, lactate 1.1, Tmax of 101.4 Received 1 dose of levaquin in ED  Plan: -Vancomycin 1000mg  IV x1 -Follow up dialysis schedule for additional vancomycin dosing -Follow up signs of clinical improvement, LOT, de-escalation of antibiotics  Height: 5\' 3"  (160 cm) Weight: 63.5 kg (139 lb 15.9 oz) IBW/kg (Calculated) : 52.4  Temp (24hrs), Avg:100.2 F (37.9 C), Min:98.8 F (37.1 C), Max:101.4 F (38.6 C)  Recent Labs  Lab 12/16/22 1807 12/16/22 2012  WBC 6.7  --   CREATININE 4.81*  --   LATICACIDVEN 1.4 1.1    Estimated Creatinine Clearance: 7.8 mL/min (A) (by C-G formula based on SCr of 4.81 mg/dL (H)).    Allergies  Allergen Reactions   Ace Inhibitors Other (See Comments) and Cough    Tongue swell , ie angioedema   Angiotensin Receptor Blockers     Angioedema with ACE-I   Penicillins Other (See Comments)    unknown    Penicillin G     Antimicrobials this admission: levaquin 10/9 >>  Vancomycin 10/9 >>   Microbiology results: 10/9 BCx:   Thank you for allowing pharmacy to be a part of this patient's care.  Arabella Merles, PharmD. Clinical Pharmacist 12/16/2022 10:07 PM

## 2022-12-16 NOTE — ED Notes (Signed)
Upon this RN's shift assessment pt is alert and oriented x 3 disoriented to situation. She has chest congestion and is grunting but PO2 is 98% on RA. She is tachycardic at 110. She denies chest pain or shortness of breath. Tunneled HD cath in left upper chest ( clamped and not in use).

## 2022-12-16 NOTE — ED Provider Notes (Addendum)
[facility] AT [facility] Provider Note   CSN: [id number] Arrival date & time: 12/16/22  1636     History  Chief Complaint  Patient presents with   Altered Mental Status    Carly Wood is a 87 y.o. female past medical history significant for ESRD, vascular dementia, multiple myeloma, hyperlipidemia, diabetes presents via EMS after being found to be more altered after dialysis.  Patient states that her arms feel heavy, cough, and that her head hurts but she cannot tell me when these started.  Patient can tell me who she is and who is president but does not know where she is, the year, or why she is here.  HPI limited due to altered mental status.  HPI     Home Medications Prior to Admission medications   Medication Sig Start Date End Date Taking? Authorizing Provider  acetaminophen (TYLENOL) 325 MG tablet Take 650 mg by mouth 3 (three) times daily.    [provider]  acyclovir (ZOVIRAX) 200 MG capsule Take 200 mg by mouth in the morning. (0800) 10/20/19   [provider]  anastrozole (ARIMIDEX) 1 MG tablet TAKE 1 TABLET BY MOUTH DAILY 11/05/20   [name], MD  calcium-vitamin D (OSCAL WITH D) 500-200 MG-UNIT tablet Take 1 tablet by mouth in the morning. (0800)    [provider]  Insulin Pen Needle (BD AUTOSHIELD DUO) 30G X 5 MM MISC by Does not apply route. 3/16"    [provider]  melatonin 3 MG TABS tablet Take 6 mg by mouth at bedtime. (2000) For Sleep 10/17/19   [provider]  multivitamin (RENA-VIT) TABS tablet Take 1 tablet by mouth at bedtime. (2100)    [provider]  Nutritional Supplements (ENSURE PLUS PO) Take 1 Can by mouth daily. 8pm    [provider]  ondansetron (ZOFRAN) 4 MG tablet Take 1 tablet (4 mg total) by mouth every 6 (six) hours as needed for nausea. 07/26/20   Shon Hale, MD  ondansetron (ZOFRAN-ODT) 4 MG disintegrating tablet Take 4 mg by mouth  daily. 01/01/22   [provider]  pantoprazole (PROTONIX) 40 MG tablet Take 40 mg by mouth 2 (two) times daily.    [provider]  sennosides-docusate sodium (SENOKOT-S) 8.6-50 MG tablet Take 1 tablet by mouth 2 (two) times daily.    [provider]  sertraline (ZOLOFT) 25 MG tablet Take 25 mg by mouth daily. 11/01/22   [provider]  sertraline (ZOLOFT) 50 MG tablet Take 50 mg by mouth daily.    [provider]  sucralfate (CARAFATE) 1 GM/10ML suspension Take 1 g by mouth in the morning, at noon, in the evening, and at bedtime. (0800, 1200, 1700 & 2100)    [provider]  UNABLE TO FIND Dysphagia 3 Diet    [provider]      Allergies    Ace inhibitors, Angiotensin receptor blockers, Penicillins, and Penicillin g    Review of Systems   Review of Systems  Respiratory:  Positive for cough.   Neurological:  Positive for weakness and headaches.    Physical Exam Updated Vital Signs BP 125/71   Pulse 97   Temp (!) 101 F (38.3 C) (Rectal)   Resp (!) 21   Ht 5\' 3"  (1.6 m)   Wt 63.5 kg   SpO2 99%   BMI 24.80 kg/m  Physical Exam Vitals and nursing note reviewed.  Constitutional:  General: She is not in acute distress.    Appearance: She is well-developed.  HENT:     Head: Normocephalic and atraumatic.     Mouth/Throat:     Mouth: Mucous membranes are moist.  Eyes:     Conjunctiva/sclera: Conjunctivae normal.  Cardiovascular:     Rate and Rhythm: Regular rhythm. Tachycardia present.     Heart sounds: No murmur heard. Pulmonary:     Effort: Pulmonary effort is normal. No respiratory distress.     Breath sounds: Normal breath sounds.  Abdominal:     Palpations: Abdomen is soft.     Tenderness: There is no abdominal tenderness.  Musculoskeletal:        General: Tenderness present.     Cervical back: Neck supple.     Right lower leg: Edema present.     Left lower leg: Edema present.  Skin:    General:  Skin is warm and dry.  Neurological:     Mental Status: She is alert.     Motor: No weakness.     Comments: Oriented to person but not place, time, events.  Dialysis states that she is not A&O x4 at baseline  Psychiatric:        Mood and Affect: Mood normal.     ED Results / Procedures / Treatments   Labs (all labs ordered are listed, but only abnormal results are displayed) Labs Reviewed  COMPREHENSIVE METABOLIC PANEL - Abnormal; Notable for the following components:      Result Value   Sodium 133 (*)    Chloride 93 (*)    Glucose, Bld 123 (*)    Creatinine, Ser 4.81 (*)    GFR, Estimated 8 (*)    Anion gap 16 (*)    All other components within normal limits  LIPASE, BLOOD - Abnormal; Notable for the following components:   Lipase 82 (*)    All other components within normal limits  CBC WITH DIFFERENTIAL/PLATELET - Abnormal; Notable for the following components:   RBC 3.78 (*)    MCV 101.3 (*)    RDW 15.6 (*)    Lymphs Abs 0.3 (*)    All other components within normal limits  CBG MONITORING, ED - Abnormal; Notable for the following components:   Glucose-Capillary 169 (*)    All other components within normal limits  TROPONIN I (HIGH SENSITIVITY) - Abnormal; Notable for the following components:   Troponin I (High Sensitivity) 58 (*)    All other components within normal limits  TROPONIN I (HIGH SENSITIVITY) - Abnormal; Notable for the following components:   Troponin I (High Sensitivity) 54 (*)    All other components within normal limits  RESP PANEL BY RT-PCR (RSV, FLU A&B, COVID)  RVPGX2  CULTURE, BLOOD (ROUTINE X 2)  CULTURE, BLOOD (ROUTINE X 2)  LACTIC ACID, PLASMA  LACTIC ACID, PLASMA  PROTIME-INR  APTT  URINALYSIS, W/ REFLEX TO CULTURE (INFECTION SUSPECTED)    EKG EKG Interpretation Date/Time:  Wednesday December 16 2022 18:18:59 EDT Ventricular Rate:  102 PR Interval:  162 QRS Duration:  80 QT Interval:  353 QTC Calculation: 460 R Axis:   33  Text  Interpretation: Sinus tachycardia Ventricular premature complex Confirmed by Eber Hong ([id number]) on 12/16/2022 6:21:28 PM  Radiology DG Chest Port 1 View  Result Date: 12/16/2022 CLINICAL DATA:  Cough, altered mental status. EXAM: PORTABLE CHEST 1 VIEW COMPARISON:  10/01/2021. FINDINGS: The heart is enlarged and the mediastinal contour is within normal limits. Atherosclerotic calcification  of the aorta is noted. Lung volumes are low. No consolidation, effusion, or pneumothorax. A left internal jugular central venous catheter appear stable. Surgical clips are present in the right axilla. No acute osseous abnormality. IMPRESSION: No active disease. Electronically Signed   By: Thornell Sartorius M.D.   On: 12/16/2022 20:29    Procedures .Critical Care  Performed by: Dolphus Jenny, PA-C Authorized by: Dolphus Jenny, PA-C   Critical care provider statement:    Critical care time (minutes):  30   Critical care was necessary to treat or prevent imminent or life-threatening deterioration of the following conditions:  Sepsis   Critical care was time spent personally by me on the following activities:  Blood draw for specimens, development of treatment plan with patient or surrogate, discussions with primary provider, obtaining history from patient or surrogate and examination of patient   Care discussed with: admitting provider       Medications Ordered in ED Medications  ceFEPIme (MAXIPIME) 2 g in sodium chloride 0.9 % 100 mL IVPB (has no administration in time range)  vancomycin (VANCOCIN) IVPB 1000 mg/200 mL premix (has no administration in time range)  vancomycin variable dose per unstable renal function (pharmacist dosing) (has no administration in time range)  metroNIDAZOLE (FLAGYL) IVPB 500 mg (has no administration in time range)  levofloxacin (LEVAQUIN) IVPB 750 mg (0 mg Intravenous Stopped 12/16/22 2206)    ED Course/ Medical Decision Making/ A&P Clinical Course as of 12/16/22 2219  Wed  Dec 16, 2022  2122 Temp: 99.5 F (37.5 C) [KK]  2122 Temp: 98.8 F (37.1 C) [KK]    Clinical Course User Index [KK] Dolphus Jenny, PA-C                                 Medical Decision Making Amount and/or Complexity of Data Reviewed Labs: ordered. Radiology: ordered.  Risk Prescription drug management. Decision regarding hospitalization.   This patient presents to the ED with chief complaint(s) of AMS with pertinent past medical history of vascular dementia, multiple myeloma, ESRD, DM, which further complicates the presenting complaint. The complaint involves an extensive differential diagnosis and also carries with it a high risk of complications and morbidity.    The differential diagnosis includes sepsis, UTI, pneumonia, electrolyte abnormality  Additional history obtained: Additional history obtained from family Records reviewed internal medicine progress notes  ED Course and Reassessment: Retal temperature of 101F at 1745 Sepsis protocol initiated at 6:00 PM as patient is febrile and tachypneic Started patient on levofloxacin as she has a penicillin allergy  Independent labs interpretation:  The following labs were independently interpreted:  CBC: No leukocytosis or leukopenia CMP: Mild hyponatremia and hypochloremia, creatinine 4.81 which given ESRD is not uncommon for her.  Elevated anion gap at 16 Lactic: 1.4 UA: Could not obtain sample. EKG: Sinus tach with PVC Lipase: Mildly elevated at 82, was also elevated at 74 on 10/01/2021 Pro time-INR: No notable findings APTT: No notable findings Troponin: 58, 54 Blood cultures: Pending CBG: 123 Respiratory panel: Negative  Independent visualization of imaging: - I independently visualized the following imaging with scope of interpretation limited to determining acute life threatening conditions related to emergency care: Chest x-ray, which revealed enlarged heart, mediastinal contour within normal limits,  arthrosclerotic calcifications of the aorta, lung volume well, no consolidation, effusion or pneumothorax.  Left internal jugular central venous catheter appears stable and surgical clips are present in  the right axilla no acute osseous abnormality.  Consultation: - Consulted or discussed management/test interpretation w/ external professional: Admission discussed with Dr. Randol Kern hospitalist and accepted for sepsis from unknown source.  Consideration for admission or further workup: Admitting for altered mental status likely bacteremia with unknown source.         Final Clinical Impression(s) / ED Diagnoses Final diagnoses:  Sepsis, due to unspecified organism, unspecified whether acute organ dysfunction present [facility])    Rx / DC Orders ED Discharge Orders     None         Gretta Began 12/16/22 2233    Eber Hong, MD 12/17/22 0734    Dolphus Jenny, PA-C 12/31/22 2033    Eber Hong, MD 01/03/23 1422

## 2022-12-16 NOTE — H&P (Signed)
TRH H&P   Patient Demographics:    Carly Wood, is a 87 y.o. female  MRN: [id number]   DOB - 11-29-1935  Admit Date - 12/16/2022  Outpatient Primary MD for the patient is Sharee Holster, NP  Referring MD/NP/PA: Cori Razor  Patient coming from: Home  Chief Complaint  Patient presents with   Altered Mental Status      HPI:    Carly Wood  is a 87 y.o. female,  with medical history significant for ESRD, hypertension, diabetes mellitus, dementia, multiple myeloma.  Patient presents to ED secondary to complaints of fever, chills, altered mental status at HD center, patient reports her arm feels heavy, she reports some headache, overall she is a poor historian, she denies any chest pain, shortness of breath, does not make urine, otherwise she cannot give any specific complaints.  Patient with known history of multiple myeloma, she is on Velcade infusion biweekly. -In ED patient was noted to be febrile 101.4, tachypneic, tachycardic, chest x-ray with no acute findings, anuric unable to obtain any urine by in and out,Creatinine at 4.8, BUN at 22, she is having left upper chest HD catheter, blood cultures were sent, she was started on broad-spectrum antibiotics and [facility] hospitalist consulted to admit.  Review of systems:      A full 10 point Review of Systems was done, except as stated above, all other Review of Systems were negative.   With Past History of the following :    Past Medical History:  Diagnosis Date   Anemia     chronic macrocytic anemia   Anxiety    Chronic kidney disease    Chronic renal disease, stage 4, severely decreased glomerular filtration rate (GFR) between 15-29 mL/min/1.73 square meter (HCC) 08/22/2015   Complication of anesthesia    delirious after Breast Surgery   Dementia (HCC)    mild   Depression    Diabetes mellitus with ESRD (end-stage renal  disease) (HCC)    type II   Dysphagia    Dyspnea    with activity   GERD (gastroesophageal reflux disease)    Glaucoma    Hyperlipidemia    Hypertension    Multiple myeloma (HCC)    Pneumonia    Stage 1 infiltrating ductal carcinoma of right female breast (HCC) 08/21/2015   ER+ PR+ HER 2 neu + (3+) T1cN0       Past Surgical History:  Procedure Laterality Date   A/V FISTULAGRAM Right 07/05/2020   Procedure: A/V Fistulagram;  Surgeon: Sherren Kerns, MD;  [facility];  Service: Cardiovascular;  Laterality: Right;   AV FISTULA PLACEMENT Left 11/22/2017   Procedure: ARTERIOVENOUS (AV) FISTULA CREATION LEFT ARM;  Surgeon: Sherren Kerns, MD;  Location: [facility] OR;  Service: Vascular;  Laterality: Left;   AV FISTULA PLACEMENT Right 04/04/2020   Procedure: RIGHT ARM ARTERIOVENOUS FISTULA CREATION;  Surgeon: Larina Earthly, MD;  Location: [facility];  Service: Vascular;  Laterality: Right;   AV FISTULA PLACEMENT Right 08/20/2020   Procedure: ARTERIOVENOUS (AV) FISTULA LIGATION RIGHT ARM;  Surgeon: Sherren Kerns, MD;  Location: [facility] OR;  Service: Vascular;  Laterality: Right;   BIOPSY  08/07/2016   Procedure: BIOPSY;  Surgeon: Corbin Ade, MD;  [facility]: [facility];  Service: Endoscopy;;  gastric ulcer biopsy   COLONOSCOPY     ESOPHAGOGASTRODUODENOSCOPY N/A 08/07/2016   LA Grade A esophagitis s/p dilation, small hiatal hernia, multiple gastric ulcers and erosions, duodenal erosions s/p biopsy. Negative H.pylori    ESOPHAGOGASTRODUODENOSCOPY N/A 11/27/2016   normal esophagus, previously noted gastric ulcers completely healed, normal duodenum.    ESOPHAGOGASTRODUODENOSCOPY (EGD) WITH PROPOFOL N/A 07/23/2020   Procedure: ESOPHAGOGASTRODUODENOSCOPY (EGD) WITH PROPOFOL;  Surgeon: Malissa Hippo, MD;  [facility];  Service: Endoscopy;  Laterality: N/A;   FISTULA SUPERFICIALIZATION Left 02/14/2018   Procedure: FISTULA SUPERFICIALIZATION LEFT ARM;  Surgeon:  Chuck Hint, MD;  Location: [facility] OR;  Service: Vascular;  Laterality: Left;   FRACTURE SURGERY Right    ankle   HOT HEMOSTASIS  07/23/2020   Procedure: HOT HEMOSTASIS (ARGON PLASMA COAGULATION/BICAP);  Surgeon: Malissa Hippo, MD;  [facility];  Service: Endoscopy;;   INTRAMEDULLARY (IM) NAIL INTERTROCHANTERIC Right 07/12/2020   Procedure: INTRAMEDULLARY (IM) NAIL INTERTROCHANTRIC;  Surgeon: Oliver Barre, MD;  Location: [facility];  Service: Orthopedics;  Laterality: Right;   MALONEY DILATION N/A 08/07/2016   Procedure: Elease Hashimoto DILATION;  Surgeon: Corbin Ade, MD;  [facility];  Service: Endoscopy;  Laterality: N/A;   MASTECTOMY, PARTIAL Right    PERIPHERAL VASCULAR BALLOON ANGIOPLASTY Left 07/13/2019   Procedure: PERIPHERAL VASCULAR BALLOON ANGIOPLASTY;  Surgeon: Cephus Shelling, MD;  [facility]: [facility];  Service: Cardiovascular;  Laterality: Left;  arm fistulogram   PERIPHERAL VASCULAR BALLOON ANGIOPLASTY Right 05/22/2020   Procedure: PERIPHERAL VASCULAR BALLOON ANGIOPLASTY;  Surgeon: Leonie Douglas, MD;  [facility]: [facility];  Service: Cardiovascular;  Laterality: Right;  arm fistula   PERIPHERAL VASCULAR BALLOON ANGIOPLASTY Right 07/05/2020   Procedure: PERIPHERAL VASCULAR BALLOON ANGIOPLASTY;  Surgeon: Sherren Kerns, MD;  [facility]: [facility];  Service: Cardiovascular;  Laterality: Right;  arm fistula   PORT-A-CATH REMOVAL Left 11/22/2017   Procedure: REMOVAL PORT-A-CATH LEFT CHEST;  Surgeon: Sherren Kerns, MD;  Location[facility] OR;  Service: Vascular;  Laterality: Left;   RETINAL DETACHMENT SURGERY Right    SCLEROTHERAPY  07/23/2020   Procedure: SCLEROTHERAPY;  Surgeon: Malissa Hippo, MD;  [facility];  Service: Endoscopy;;   UPPER EXTREMITY VENOGRAPHY N/A 08/22/2020   Procedure: LEFT UPPER & CENTRAL VENOGRAPHY;  Surgeon: Cephus Shelling, MD;  [facility]: [facility];  Service:  Cardiovascular;  Laterality: N/A;      Social History:     Social History   Tobacco Use   Smoking status: Never   Smokeless tobacco: Never  Substance Use Topics   Alcohol use: No    Alcohol/week: 0.0 standard drinks of alcohol       Family History :     Family History  Problem Relation Age of Onset   Multiple myeloma Sister    Brain cancer Sister    Dementia Mother        died at 85   Stroke Mother    Heart failure Mother    Diabetes Mother  Heart disease Father    Prostate cancer Brother    Colon cancer Neg Hx       Home Medications:   Prior to Admission medications   Medication Sig Start Date End Date Taking? Authorizing Provider  acetaminophen (TYLENOL) 325 MG tablet Take 650 mg by mouth 3 (three) times daily.    [provider]  acyclovir (ZOVIRAX) 200 MG capsule Take 200 mg by mouth in the morning. (0800) 10/20/19   [provider]  anastrozole (ARIMIDEX) 1 MG tablet TAKE 1 TABLET BY MOUTH DAILY 11/05/20   [name], MD  calcium-vitamin D (OSCAL WITH D) 500-200 MG-UNIT tablet Take 1 tablet by mouth in the morning. (0800)    [provider]  Insulin Pen Needle (BD AUTOSHIELD DUO) 30G X 5 MM MISC by Does not apply route. 3/16"    [provider]  melatonin 3 MG TABS tablet Take 6 mg by mouth at bedtime. (2000) For Sleep 10/17/19   [provider]  multivitamin (RENA-VIT) TABS tablet Take 1 tablet by mouth at bedtime. (2100)    [provider]  Nutritional Supplements (ENSURE PLUS PO) Take 1 Can by mouth daily. 8pm    [provider]  ondansetron (ZOFRAN) 4 MG tablet Take 1 tablet (4 mg total) by mouth every 6 (six) hours as needed for nausea. 07/26/20   Shon Hale, MD  ondansetron (ZOFRAN-ODT) 4 MG disintegrating tablet Take 4 mg by mouth daily. 01/01/22   [provider]  pantoprazole (PROTONIX) 40 MG tablet Take 40 mg by mouth 2 (two) times daily.    [provider]   sennosides-docusate sodium (SENOKOT-S) 8.6-50 MG tablet Take 1 tablet by mouth 2 (two) times daily.    [provider]  sertraline (ZOLOFT) 25 MG tablet Take 25 mg by mouth daily. 11/01/22   [provider]  sertraline (ZOLOFT) 50 MG tablet Take 50 mg by mouth daily.    [provider]  sucralfate (CARAFATE) 1 GM/10ML suspension Take 1 g by mouth in the morning, at noon, in the evening, and at bedtime. (0800, 1200, 1700 & 2100)    [provider]  UNABLE TO FIND Dysphagia 3 Diet    [provider]     Allergies:     Allergies  Allergen Reactions   Ace Inhibitors Other (See Comments) and Cough    Tongue swell , ie angioedema   Angiotensin Receptor Blockers     Angioedema with ACE-I   Penicillins Other (See Comments)    unknown    Penicillin G      Physical Exam:   Vitals  Blood pressure 125/71, pulse 97, temperature (!) 101 F (38.3 C), temperature source Rectal, resp. rate (!) 21, height 5\' 3"  (1.6 m), weight 63.5 kg, SpO2 99%.   1. General Frail, deconditioned  2.  Awake, alert, pleasant, appropriate, in no apparent distress  3. No F.N deficits, ALL C.Nerves Intact, Strength 5/5 all 4 extremities, Sensation intact all 4 extremities, Plantars down going.  4. Ears and Eyes appear Normal, Conjunctivae clear, PERRLA. Moist Oral Mucosa.  5. Supple Neck, No JVD, No cervical lymphadenopathy appriciated, No Carotid Bruits.  6. Symmetrical Chest wall movement, Good air movement bilaterally, CTAB.  Has left upper chest permacath,  7. RRR, No Gallops, Rubs or Murmurs, No Parasternal Heave.  8. Positive Bowel Sounds, Abdomen Soft, No tenderness, No organomegaly appriciated,No rebound -guarding or rigidity.  9.  No Cyanosis, Normal Skin Turgor, No Skin Rash or Bruise.  10. Good muscle tone,  joints appear normal , no effusions, Normal ROM.    Data Review:    CBC Recent Labs  Lab 12/16/22 1807  WBC 6.7  HGB 12.4  HCT 38.3   PLT 201  MCV 101.3*  MCH 32.8  MCHC 32.4  RDW 15.6*  LYMPHSABS 0.3*  MONOABS 0.4  EOSABS 0.0  BASOSABS 0.0   ------------------------------------------------------------------------------------------------------------------  Chemistries  Recent Labs  Lab 12/16/22 1807  NA 133*  K 4.1  CL 93*  CO2 24  GLUCOSE 123*  BUN 22  CREATININE 4.81*  CALCIUM 9.4  AST 18  ALT 14  ALKPHOS 80  BILITOT 0.6   ------------------------------------------------------------------------------------------------------------------ estimated creatinine clearance is 7.8 mL/min (A) (by C-G formula based on SCr of 4.81 mg/dL (H)). ------------------------------------------------------------------------------------------------------------------ No results for input(s): "TSH", "T4TOTAL", "T3FREE", "THYROIDAB" in the last 72 hours.  Invalid input(s): "FREET3"  Coagulation profile Recent Labs  Lab 12/16/22 1807  INR 1.1   ------------------------------------------------------------------------------------------------------------------- No results for input(s): "DDIMER" in the last 72 hours. -------------------------------------------------------------------------------------------------------------------  Cardiac Enzymes No results for input(s): "CKMB", "TROPONINI", "MYOGLOBIN" in the last 168 hours.  Invalid input(s): "CK" ------------------------------------------------------------------------------------------------------------------ No results found for: "BNP"   ---------------------------------------------------------------------------------------------------------------  Urinalysis No results found for: "COLORURINE", "APPEARANCEUR", "LABSPEC", "PHURINE", "GLUCOSEU", "HGBUR", "BILIRUBINUR", "KETONESUR", "PROTEINUR", "UROBILINOGEN", "NITRITE", "LEUKOCYTESUR"  ----------------------------------------------------------------------------------------------------------------   Imaging  Results:    DG Chest Port 1 View  Result Date: 12/16/2022 CLINICAL DATA:  Cough, altered mental status. EXAM: PORTABLE CHEST 1 VIEW COMPARISON:  10/01/2021. FINDINGS: The heart is enlarged and the mediastinal contour is within normal limits. Atherosclerotic calcification of the aorta is noted. Lung volumes are low. No consolidation, effusion, or pneumothorax. A left internal jugular central venous catheter appear stable. Surgical clips are present in the right axilla. No acute osseous abnormality. IMPRESSION: No active disease. Electronically Signed   By: Thornell Sartorius M.D.   On: 12/16/2022 20:29       Assessment & Plan:    Principal Problem:   Sepsis (HCC) Active Problems:   ESRD on hemodialysis (HCC)   GERD (gastroesophageal reflux disease)   Multiple myeloma (HCC)   Diabetes mellitus with ESRD (end-stage renal disease) (HCC)   Anemia due to end stage renal disease (HCC)   Aortic atherosclerosis (HCC)   Hyperlipidemia associated with type 2 diabetes mellitus (HCC)   Breast cancer (HCC)    Sepsis , present on admission -patient presents with fever, tachypnea, tachycardia -So far source of sepsis is unknown, does not make urine, no meningeal signs, chest x-ray with no acute findings, no concern for cellulitis, she is HD patient, high concern for bacteremia, admitted under sepsis pathway, blood cultures were sent, will continue with IV cefepime and vancomycin for now -COVID 19 negative, she denies any respiratory symptoms  ESRD -On hemodialysis, Monday Wednesday Friday schedule, she was dialyzed today, will consult renal to continue HD, no indication for emergent hemodialysis.  History of breast cancer/multiple myeloma -She is following with Dr. Ellin Saba, continue with anastrozole, she is on Velcade as an outpatient  GERD -Continue with PPI  Hypertension -Not on any home medications, blood pressures acceptable, will keep on as needed hydralazine  diabetes mellitus  -Does  not appear to be on any medication, will monitor her CBG for now, will check A1c,    DVT Prophylaxis Heparin  AM Labs Ordered, also please review Full Orders  Family Communication: Admission, patients condition and plan of care including tests being ordered have been discussed with the patient and her sister Pattricia Boss  by the phone who indicate understanding and agree with the plan and Code Status.  Code Status DNR, confirmed by the patient, as well she had DNR form and records and previous hospitalization she was DNR.  Likely DC to home  Consults called: Renal consult placed in epic  Admission status: Inpatient  Time spent in minutes : 70 minutes   Huey Bienenstock M.D on 12/16/2022 at 10:17 PM   [facility] - Office  [phone]

## 2022-12-16 NOTE — ED Notes (Addendum)
Report given to [facility] RN on 300. [place] RN

## 2022-12-16 NOTE — ED Notes (Signed)
PA informed of fever. B Raeqwon Lux RN

## 2022-12-16 NOTE — Sepsis Progress Note (Signed)
Code Sepsis protocol being monitored by eLink. 

## 2022-12-16 NOTE — ED Triage Notes (Signed)
[facility] reported that patient, who lives at the [place] [facility], was at dialysis and became unresponsive after completion, so they called EMS. EMS said she was able to provide answers about the current president, who she was, her birthday, but didn't know most other answers.

## 2022-12-16 NOTE — Progress Notes (Signed)
Pharmacy Antibiotic Note  Carly Wood is a 87 y.o. female admitted on 12/16/2022 with sepsis.  Pharmacy has been consulted for vancomycin dosing. PMH includes HD where she became unresponsive after completing dialysis on 10/9.  WBC 6.7, sCr 4.81, lactate 1.1, Tmax of 101.4 Received 1 dose of levaquin in ED  Plan: -Vancomycin 1000mg  IV x1 -Follow up dialysis schedule for additional vancomycin dosing -Follow up signs of clinical improvement, LOT, de-escalation of antibiotics  Height: 5\' 3"  (160 cm) Weight: 63.5 kg (139 lb 15.9 oz) IBW/kg (Calculated) : 52.4  Temp (24hrs), Avg:99.8 F (37.7 C), Min:98.3 F (36.8 C), Max:101.4 F (38.6 C)  Recent Labs  Lab 12/16/22 1807 12/16/22 2012  WBC 6.7  --   CREATININE 4.81*  --   LATICACIDVEN 1.4 1.1    Estimated Creatinine Clearance: 7.8 mL/min (A) (by C-G formula based on SCr of 4.81 mg/dL (H)).    Allergies  Allergen Reactions   Ace Inhibitors Other (See Comments) and Cough    Tongue swell , ie angioedema   Angiotensin Receptor Blockers     Angioedema with ACE-I   Penicillins Other (See Comments)    unknown    Penicillin G     Antimicrobials this admission: levaquin 10/9 >>  Vancomycin 10/9 >>   Microbiology results: 10/9 BCx:   Thank you for allowing pharmacy to be a part of this patient's care.  Arabella Merles, PharmD. Clinical Pharmacist 12/16/2022 10:23 PM   ADDENDUM -adding cefepime as well. Was given dose tonight on 10/9 with no reactions recorded. Will dose cefepime 1g IV every 24 hours  Arabella Merles, PharmD. Clinical Pharmacist 12/16/2022 10:24 PM

## 2022-12-17 DIAGNOSIS — C9000 Multiple myeloma not having achieved remission: Secondary | ICD-10-CM

## 2022-12-17 DIAGNOSIS — R509 Fever, unspecified: Secondary | ICD-10-CM

## 2022-12-17 DIAGNOSIS — N186 End stage renal disease: Secondary | ICD-10-CM | POA: Diagnosis not present

## 2022-12-17 DIAGNOSIS — R6510 Systemic inflammatory response syndrome (SIRS) of non-infectious origin without acute organ dysfunction: Secondary | ICD-10-CM | POA: Diagnosis not present

## 2022-12-17 DIAGNOSIS — D631 Anemia in chronic kidney disease: Secondary | ICD-10-CM

## 2022-12-17 LAB — CBC
HCT: 32.3 % — ABNORMAL LOW (ref 36.0–46.0)
Hemoglobin: 10.4 g/dL — ABNORMAL LOW (ref 12.0–15.0)
MCH: 32.8 pg (ref 26.0–34.0)
MCHC: 32.2 g/dL (ref 30.0–36.0)
MCV: 101.9 fL — ABNORMAL HIGH (ref 80.0–100.0)
Platelets: 129 10*3/uL — ABNORMAL LOW (ref 150–400)
RBC: 3.17 MIL/uL — ABNORMAL LOW (ref 3.87–5.11)
RDW: 15.7 % — ABNORMAL HIGH (ref 11.5–15.5)
WBC: 4.5 10*3/uL (ref 4.0–10.5)
nRBC: 0 % (ref 0.0–0.2)

## 2022-12-17 LAB — DIFFERENTIAL
Abs Immature Granulocytes: 0.02 10*3/uL (ref 0.00–0.07)
Basophils Absolute: 0 10*3/uL (ref 0.0–0.1)
Basophils Relative: 1 %
Eosinophils Absolute: 0 10*3/uL (ref 0.0–0.5)
Eosinophils Relative: 1 %
Immature Granulocytes: 1 %
Lymphocytes Relative: 12 %
Lymphs Abs: 0.5 10*3/uL — ABNORMAL LOW (ref 0.7–4.0)
Monocytes Absolute: 0.7 10*3/uL (ref 0.1–1.0)
Monocytes Relative: 15 %
Neutro Abs: 3.1 10*3/uL (ref 1.7–7.7)
Neutrophils Relative %: 70 %

## 2022-12-17 LAB — GLUCOSE, CAPILLARY
Glucose-Capillary: 89 mg/dL (ref 70–99)
Glucose-Capillary: 105 mg/dL — ABNORMAL HIGH (ref 70–99)
Glucose-Capillary: 82 mg/dL (ref 70–99)
Glucose-Capillary: 116 mg/dL — ABNORMAL HIGH (ref 70–99)

## 2022-12-17 LAB — BASIC METABOLIC PANEL
Anion gap: 14 (ref 5–15)
BUN: 28 mg/dL — ABNORMAL HIGH (ref 8–23)
CO2: 24 mmol/L (ref 22–32)
Calcium: 9.4 mg/dL (ref 8.9–10.3)
Chloride: 95 mmol/L — ABNORMAL LOW (ref 98–111)
Creatinine, Ser: 6 mg/dL — ABNORMAL HIGH (ref 0.44–1.00)
GFR, Estimated: 6 mL/min — ABNORMAL LOW (ref >60)
Glucose, Bld: 72 mg/dL (ref 70–99)
Potassium: 4.6 mmol/L (ref 3.5–5.1)
Sodium: 133 mmol/L — ABNORMAL LOW (ref 135–145)

## 2022-12-17 MED ORDER — ORAL CARE MOUTH RINSE: 15.0000 mL | OROMUCOSAL | Status: DC | PRN

## 2022-12-17 MED ORDER — SODIUM CHLORIDE 0.9 % IV SOLN
2.0000 g | INTRAVENOUS | Status: DC
  Administered 2022-12-18: 2 g via INTRAVENOUS
  Filled 2022-12-17: qty 12.5

## 2022-12-17 MED ORDER — CHLORHEXIDINE GLUCONATE CLOTH 2 % EX PADS
6.0000 | MEDICATED_PAD | Freq: Every day | CUTANEOUS | Status: DC
  Administered 2022-12-17 – 2022-12-22 (×6): 6 via TOPICAL

## 2022-12-17 MED ORDER — VANCOMYCIN HCL 750 MG/150ML IV SOLN
750.0000 mg | INTRAVENOUS | Status: DC
  Administered 2022-12-18: 750 mg via INTRAVENOUS
  Filled 2022-12-17: qty 150

## 2022-12-17 NOTE — Consult Note (Signed)
Carly Wood Admit Date: 12/16/2022 12/17/2022 Carly Wood Requesting Physician:  Rito Ehrlich MD  Reason for Consult:  ESRD comanagement, Fevers, AMS HPI:  20F ESRD MWF using L internal jugular TDC at ?[facility] presented to the ED yesterday after developing fever, chills, confusion of dialysis.  In the ED she was febrile to 101.4 F with tachypnea, tachycardia and admitted for sepsis, placed on cefepime and vancomycin.  Workup with negative RSV/flu/COVID testing, negative portable chest x-ray.  Overnight she has done well, defervesced, and is interactive this morning.  Outpatient dialysis records are unavailable and recent therapies are unclear.  She has no specific complaints this morning.  Denies cough, pain, dyspnea.  No tenderness along the [facility] track.  Blood cultures gathered yesterday are no growth to date.  Labs here today with K of 4.6, sodium 133, bicarbonate 24, hemoglobin 10.4, WBC 4.5.  PMH Incudes: Myeloma on Velcade therapy, dementia, DM2, hypertension  ROS Balance of 12 systems is negative w/ exceptions as above  PMH  Past Medical History:  Diagnosis Date   Anemia     chronic macrocytic anemia   Anxiety    Chronic kidney disease    Chronic renal disease, stage 4, severely decreased glomerular filtration rate (GFR) between 15-29 mL/min/1.73 square meter (HCC) 08/22/2015   Complication of anesthesia    delirious after Breast Surgery   Dementia (HCC)    mild   Depression    Diabetes mellitus with ESRD (end-stage renal disease) (HCC)    type II   Dysphagia    Dyspnea    with activity   GERD (gastroesophageal reflux disease)    Glaucoma    Hyperlipidemia    Hypertension    Multiple myeloma (HCC)    Pneumonia    Stage 1 infiltrating ductal carcinoma of right female breast (HCC) 08/21/2015   ER+ PR+ HER 2 neu + (3+) T1cN0    PSH  Past Surgical History:  Procedure Laterality Date   A/V FISTULAGRAM Right 07/05/2020   Procedure: A/V Fistulagram;  Surgeon:  Sherren Kerns, MD;  [facility];  Service: Cardiovascular;  Laterality: Right;   AV FISTULA PLACEMENT Left 11/22/2017   Procedure: ARTERIOVENOUS (AV) FISTULA CREATION LEFT ARM;  Surgeon: Sherren Kerns, MD;  Location: [facility] OR;  Service: Vascular;  Laterality: Left;   AV FISTULA PLACEMENT Right 04/04/2020   Procedure: RIGHT ARM ARTERIOVENOUS FISTULA CREATION;  Surgeon: Larina Earthly, MD;  Location: [facility]S;  Service: Vascular;  Laterality: Right;   AV FISTULA PLACEMENT Right 08/20/2020   Procedure: ARTERIOVENOUS (AV) FISTULA LIGATION RIGHT ARM;  Surgeon: Sherren Kerns, MD;  Location: [facility] OR;  Service: Vascular;  Laterality: Right;   BIOPSY  08/07/2016   Procedure: BIOPSY;  Surgeon: Corbin Ade, MD;  [facility];  Service: Endoscopy;;  gastric ulcer biopsy   COLONOSCOPY     ESOPHAGOGASTRODUODENOSCOPY N/A 08/07/2016   LA Grade A esophagitis s/p dilation, small hiatal hernia, multiple gastric ulcers and erosions, duodenal erosions s/p biopsy. Negative H.pylori    ESOPHAGOGASTRODUODENOSCOPY N/A 11/27/2016   normal esophagus, previously noted gastric ulcers completely healed, normal duodenum.    ESOPHAGOGASTRODUODENOSCOPY (EGD) WITH PROPOFOL N/A 07/23/2020   Procedure: ESOPHAGOGASTRODUODENOSCOPY (EGD) WITH PROPOFOL;  Surgeon: Malissa Hippo, MD;  [facility];  Service: Endoscopy;  Laterality: N/A;   FISTULA SUPERFICIALIZATION Left 02/14/2018   Procedure: FISTULA SUPERFICIALIZATION LEFT ARM;  Surgeon: Chuck Hint, MD;  Location: [facility] OR;  Service: Vascular;  Laterality: Left;  FRACTURE SURGERY Right    ankle   HOT HEMOSTASIS  07/23/2020   Procedure: HOT HEMOSTASIS (ARGON PLASMA COAGULATION/BICAP);  Surgeon: Malissa Hippo, MD;  [facility];  Service: Endoscopy;;   INTRAMEDULLARY (IM) NAIL INTERTROCHANTERIC Right 07/12/2020   Procedure: INTRAMEDULLARY (IM) NAIL INTERTROCHANTRIC;  Surgeon: Oliver Barre, MD;  Location: [facility];  Service: Orthopedics;  Laterality: Right;   MALONEY DILATION N/A 08/07/2016   Procedure: Elease Hashimoto DILATION;  Surgeon: Corbin Ade, MD;  [facility];  Service: Endoscopy;  Laterality: N/A;   MASTECTOMY, PARTIAL Right    PERIPHERAL VASCULAR BALLOON ANGIOPLASTY Left 07/13/2019   Procedure: PERIPHERAL VASCULAR BALLOON ANGIOPLASTY;  Surgeon: Cephus Shelling, MD;  [facility];  Service: Cardiovascular;  Laterality: Left;  arm fistulogram   PERIPHERAL VASCULAR BALLOON ANGIOPLASTY Right 05/22/2020   Procedure: PERIPHERAL VASCULAR BALLOON ANGIOPLASTY;  Surgeon: Leonie Douglas, MD;  [facility];  Service: Cardiovascular;  Laterality: Right;  arm fistula   PERIPHERAL VASCULAR BALLOON ANGIOPLASTY Right 07/05/2020   Procedure: PERIPHERAL VASCULAR BALLOON ANGIOPLASTY;  Surgeon: Sherren Kerns, MD;  [facility];  Service: Cardiovascular;  Laterality: Right;  arm fistula   PORT-A-CATH REMOVAL Left 11/22/2017   Procedure: REMOVAL PORT-A-CATH LEFT CHEST;  Surgeon: Sherren Kerns, MD;  Location: [facility] OR;  Service: Vascular;  Laterality: Left;   RETINAL DETACHMENT SURGERY Right    SCLEROTHERAPY  07/23/2020   Procedure: SCLEROTHERAPY;  Surgeon: Malissa Hippo, MD;  [facility];  Service: Endoscopy;;   UPPER EXTREMITY VENOGRAPHY N/A 08/22/2020   Procedure: LEFT UPPER & CENTRAL VENOGRAPHY;  Surgeon: Cephus Shelling, MD;  [facility];  Service: Cardiovascular;  Laterality: N/A;   FH  Family History  Problem Relation Age of Onset   Multiple myeloma Sister    Brain cancer Sister    Dementia Mother        died at 78   Stroke Mother    Heart failure Mother    Diabetes Mother    Heart disease Father    Prostate cancer Brother    Colon cancer Neg Hx    SH  reports that she has never smoked. She has never used smokeless tobacco. She reports that she does not drink alcohol and does not use  drugs. Allergies  Allergies  Allergen Reactions   Ace Inhibitors Other (See Comments) and Cough    Tongue swell , ie angioedema   Angiotensin Receptor Blockers     Angioedema with ACE-I   Penicillins Other (See Comments)    unknown    Penicillin G    Home medications Prior to Admission medications   Medication Sig Start Date End Date Taking? Authorizing Provider  acetaminophen (TYLENOL) 325 MG tablet Take 650 mg by mouth 3 (three) times daily.    [provider]  acyclovir (ZOVIRAX) 200 MG capsule Take 200 mg by mouth in the morning. (0800) 10/20/19   [provider]  anastrozole (ARIMIDEX) 1 MG tablet TAKE 1 TABLET BY MOUTH DAILY 11/05/20   [name], MD  calcium-vitamin D (OSCAL WITH D) 500-200 MG-UNIT tablet Take 1 tablet by mouth in the morning. (0800)    [provider]  Insulin Pen Needle (BD AUTOSHIELD DUO) 30G X 5 MM MISC by Does not apply route. 3/16"    [provider]  melatonin 3 MG TABS tablet Take 6 mg by mouth at bedtime. (2000) For Sleep 10/17/19  [provider]  multivitamin (RENA-VIT) TABS tablet Take 1 tablet by mouth at bedtime. (2100)    [provider]  Nutritional Supplements (ENSURE PLUS PO) Take 1 Can by mouth daily. 8pm    [provider]  ondansetron (ZOFRAN) 4 MG tablet Take 1 tablet (4 mg total) by mouth every 6 (six) hours as needed for nausea. 07/26/20   Shon Hale, MD  ondansetron (ZOFRAN-ODT) 4 MG disintegrating tablet Take 4 mg by mouth daily. 01/01/22   [provider]  pantoprazole (PROTONIX) 40 MG tablet Take 40 mg by mouth 2 (two) times daily.    [provider]  sennosides-docusate sodium (SENOKOT-S) 8.6-50 MG tablet Take 1 tablet by mouth 2 (two) times daily.    [provider]  sertraline (ZOLOFT) 25 MG tablet Take 25 mg by mouth daily. 11/01/22   [provider]  sertraline (ZOLOFT) 50 MG tablet Take 50 mg by mouth daily.    [provider]  sucralfate (CARAFATE) 1 GM/10ML suspension Take 1 g by mouth in the morning, at noon, in the evening, and at bedtime. (0800, 1200, 1700 & 2100)    [provider]  UNABLE TO FIND Dysphagia 3 Diet    [provider]    Current Medications Scheduled Meds:  acyclovir  200 mg Oral q AM   anastrozole  1 mg Oral Daily   Chlorhexidine Gluconate Cloth  6 each Topical Daily   melatonin  6 mg Oral QHS   vancomycin variable dose per unstable renal function (pharmacist dosing)   Does not apply See admin instructions   Continuous Infusions:  ceFEPime (MAXIPIME) IV     PRN Meds:.acetaminophen **OR** acetaminophen, albuterol, hydrALAZINE, ondansetron **OR** ondansetron (ZOFRAN) IV, mouth rinse  CBC Recent Labs  Lab 12/16/22 1807 12/17/22 0434 12/17/22 0530  WBC 6.7 4.5  --   NEUTROABS 5.9  --  3.1  HGB 12.4 10.4*  --   HCT 38.3 32.3*  --   MCV 101.3* 101.9*  --   PLT 201 129*  --    Basic Metabolic Panel Recent Labs  Lab 12/16/22 1807 12/17/22 0434  NA 133* 133*  K 4.1 4.6  CL 93* 95*  CO2 24 24  GLUCOSE 123* 72  BUN 22 28*  CREATININE 4.81* 6.00*  CALCIUM 9.4 9.4    Physical Exam  Blood pressure 132/71, pulse 93, temperature 98.3 F (36.8 C), resp. rate 20, height 5\' 3"  (1.6 m), weight 62.1 kg, SpO2 100%. GEN: Elderly female, no distress, awake and alert ENT: NCAT, poor dentition EYES: EOMI CV: 3/6 MSM loudest at left lower sternal border PULM: Diminished in the bases, otherwise clear, normal work of breathing ABD: Soft, nontender SKIN: Left IJ TDC without tenderness, erythema, identified purulence EXT: No significant edema NEURO: Alert and oriented to self, location, day, month; not date   Assessment 82F ESRD MWF L internal jugular TDC with AMS and Fever.  ESRD MWF on schedule.  ?[facility] AMS with Fever: Better today.  Empiric vancomycin and cefepime.  Blood cultures pending.  Workup negative thus far.  No obvious involvement of  TDC but if bacteremia present will need catheter holiday; moderate probability Anemia: Hemoglobin at target, trend CKD-BMD: Calcium stable.  No inpatient issues, trend outpatient management Hypertension/volume: Stable blood pressures, no overt hypervolemia, 1 to 2 L UF tomorrow as BP permits Myeloma under the care of oncology; receiving Velcade History of dementia Mild hyponatremia, will correct with dialysis, trend  Plan As above, HD  tomorrow  Carly Wood  12/17/2022, 9:51 AM

## 2022-12-17 NOTE — Plan of Care (Signed)

## 2022-12-17 NOTE — Consult Note (Signed)
Carly Wood Admit Date: 12/16/2022 12/17/2022 Carly Wood Requesting Physician:  Rito Ehrlich MD  Reason for Consult:  ESRD comanagement, Fevers, AMS HPI:  39F ESRD MWF using L internal jugular TDC at ?[facility] presented to the ED yesterday after developing fever, chills, confusion of dialysis.  In the ED she was febrile to 101.4 F with tachypnea, tachycardia and admitted for sepsis, placed on cefepime and vancomycin.  Workup with negative RSV/flu/COVID testing, negative portable chest x-ray.  Overnight she has done well, defervesced, and is interactive this morning.  Outpatient dialysis records are unavailable and recent therapies are unclear.  She has no specific complaints this morning.  Denies cough, pain, dyspnea.  No tenderness along the [facility] track.  Blood cultures gathered yesterday are no growth to date.  Labs here today with K of 4.6, sodium 133, bicarbonate 24, hemoglobin 10.4, WBC 4.5.  PMH Incudes: Myeloma on Velcade therapy, dementia, DM2, hypertension  ROS Balance of 12 systems is negative w/ exceptions as above  PMH  Past Medical History:  Diagnosis Date   Anemia     chronic macrocytic anemia   Anxiety    Chronic kidney disease    Chronic renal disease, stage 4, severely decreased glomerular filtration rate (GFR) between 15-29 mL/min/1.73 square meter (HCC) 08/22/2015   Complication of anesthesia    delirious after Breast Surgery   Dementia (HCC)    mild   Depression    Diabetes mellitus with ESRD (end-stage renal disease) (HCC)    type II   Dysphagia    Dyspnea    with activity   GERD (gastroesophageal reflux disease)    Glaucoma    Hyperlipidemia    Hypertension    Multiple myeloma (HCC)    Pneumonia    Stage 1 infiltrating ductal carcinoma of right female breast (HCC) 08/21/2015   ER+ PR+ HER 2 neu + (3+) T1cN0    PSH  Past Surgical History:  Procedure Laterality Date   A/V FISTULAGRAM Right 07/05/2020   Procedure: A/V Fistulagram;  Surgeon:  Sherren Kerns, MD;  [facility];  Service: Cardiovascular;  Laterality: Right;   AV FISTULA PLACEMENT Left 11/22/2017   Procedure: ARTERIOVENOUS (AV) FISTULA CREATION LEFT ARM;  Surgeon: Sherren Kerns, MD;  Location: [facility] OR;  Service: Vascular;  Laterality: Left;   AV FISTULA PLACEMENT Right 04/04/2020   Procedure: RIGHT ARM ARTERIOVENOUS FISTULA CREATION;  Surgeon: Larina Earthly, MD;  Location: [facility];  Service: Vascular;  Laterality: Right;   AV FISTULA PLACEMENT Right 08/20/2020   Procedure: ARTERIOVENOUS (AV) FISTULA LIGATION RIGHT ARM;  Surgeon: Sherren Kerns, MD;  Location: [facility] OR;  Service: Vascular;  Laterality: Right;   BIOPSY  08/07/2016   Procedure: BIOPSY;  Surgeon: Corbin Ade, MD;  [facility];  Service: Endoscopy;;  gastric ulcer biopsy   COLONOSCOPY     ESOPHAGOGASTRODUODENOSCOPY N/A 08/07/2016   LA Grade A esophagitis s/p dilation, small hiatal hernia, multiple gastric ulcers and erosions, duodenal erosions s/p biopsy. Negative H.pylori    ESOPHAGOGASTRODUODENOSCOPY N/A 11/27/2016   normal esophagus, previously noted gastric ulcers completely healed, normal duodenum.    ESOPHAGOGASTRODUODENOSCOPY (EGD) WITH PROPOFOL N/A 07/23/2020   Procedure: ESOPHAGOGASTRODUODENOSCOPY (EGD) WITH PROPOFOL;  Surgeon: Malissa Hippo, MD;  [facility];  Service: Endoscopy;  Laterality: N/A;   FISTULA SUPERFICIALIZATION Left 02/14/2018   Procedure: FISTULA SUPERFICIALIZATION LEFT ARM;  Surgeon: Chuck Hint, MD;  Location: [facility] OR;  Service: Vascular;  Laterality: Left;  FRACTURE SURGERY Right    ankle   HOT HEMOSTASIS  07/23/2020   Procedure: HOT HEMOSTASIS (ARGON PLASMA COAGULATION/BICAP);  Surgeon: Malissa Hippo, MD;  [facility];  Service: Endoscopy;;   INTRAMEDULLARY (IM) NAIL INTERTROCHANTERIC Right 07/12/2020   Procedure: INTRAMEDULLARY (IM) NAIL INTERTROCHANTRIC;  Surgeon: Oliver Barre, MD;  Location: [facility];  Service: Orthopedics;  Laterality: Right;   MALONEY DILATION N/A 08/07/2016   Procedure: Elease Hashimoto DILATION;  Surgeon: Corbin Ade, MD;  [facility];  Service: Endoscopy;  Laterality: N/A;   MASTECTOMY, PARTIAL Right    PERIPHERAL VASCULAR BALLOON ANGIOPLASTY Left 07/13/2019   Procedure: PERIPHERAL VASCULAR BALLOON ANGIOPLASTY;  Surgeon: Cephus Shelling, MD;  [facility];  Service: Cardiovascular;  Laterality: Left;  arm fistulogram   PERIPHERAL VASCULAR BALLOON ANGIOPLASTY Right 05/22/2020   Procedure: PERIPHERAL VASCULAR BALLOON ANGIOPLASTY;  Surgeon: Leonie Douglas, MD;  [facility];  Service: Cardiovascular;  Laterality: Right;  arm fistula   PERIPHERAL VASCULAR BALLOON ANGIOPLASTY Right 07/05/2020   Procedure: PERIPHERAL VASCULAR BALLOON ANGIOPLASTY;  Surgeon: Sherren Kerns, MD;  [facility];  Service: Cardiovascular;  Laterality: Right;  arm fistula   PORT-A-CATH REMOVAL Left 11/22/2017   Procedure: REMOVAL PORT-A-CATH LEFT CHEST;  Surgeon: Sherren Kerns, MD;  Location: [facility] OR;  Service: Vascular;  Laterality: Left;   RETINAL DETACHMENT SURGERY Right    SCLEROTHERAPY  07/23/2020   Procedure: SCLEROTHERAPY;  Surgeon: Malissa Hippo, MD;  [facility];  Service: Endoscopy;;   UPPER EXTREMITY VENOGRAPHY N/A 08/22/2020   Procedure: LEFT UPPER & CENTRAL VENOGRAPHY;  Surgeon: Cephus Shelling, MD;  [facility];  Service: Cardiovascular;  Laterality: N/A;   FH  Family History  Problem Relation Age of Onset   Multiple myeloma Sister    Brain cancer Sister    Dementia Mother        died at 53   Stroke Mother    Heart failure Mother    Diabetes Mother    Heart disease Father    Prostate cancer Brother    Colon cancer Neg Hx    SH  reports that she has never smoked. She has never used smokeless tobacco. She reports that she does not drink alcohol and does not use  drugs. Allergies  Allergies  Allergen Reactions   Ace Inhibitors Other (See Comments) and Cough    Tongue swell , ie angioedema   Angiotensin Receptor Blockers     Angioedema with ACE-I   Penicillins Other (See Comments)    unknown    Penicillin G    Home medications Prior to Admission medications   Medication Sig Start Date End Date Taking? Authorizing Provider  acetaminophen (TYLENOL) 325 MG tablet Take 650 mg by mouth 3 (three) times daily.    [provider]  acyclovir (ZOVIRAX) 200 MG capsule Take 200 mg by mouth in the morning. (0800) 10/20/19   [provider]  anastrozole (ARIMIDEX) 1 MG tablet TAKE 1 TABLET BY MOUTH DAILY 11/05/20   [name], MD  calcium-vitamin D (OSCAL WITH D) 500-200 MG-UNIT tablet Take 1 tablet by mouth in the morning. (0800)    [provider]  Insulin Pen Needle (BD AUTOSHIELD DUO) 30G X 5 MM MISC by Does not apply route. 3/16"    [provider]  melatonin 3 MG TABS tablet Take 6 mg by mouth at bedtime. (2000) For Sleep 10/17/19  [provider]  multivitamin (RENA-VIT) TABS tablet Take 1 tablet by mouth at bedtime. (2100)    [provider]  Nutritional Supplements (ENSURE PLUS PO) Take 1 Can by mouth daily. 8pm    [provider]  ondansetron (ZOFRAN) 4 MG tablet Take 1 tablet (4 mg total) by mouth every 6 (six) hours as needed for nausea. 07/26/20   Shon Hale, MD  ondansetron (ZOFRAN-ODT) 4 MG disintegrating tablet Take 4 mg by mouth daily. 01/01/22   [provider]  pantoprazole (PROTONIX) 40 MG tablet Take 40 mg by mouth 2 (two) times daily.    [provider]  sennosides-docusate sodium (SENOKOT-S) 8.6-50 MG tablet Take 1 tablet by mouth 2 (two) times daily.    [provider]  sertraline (ZOLOFT) 25 MG tablet Take 25 mg by mouth daily. 11/01/22   [provider]  sertraline (ZOLOFT) 50 MG tablet Take 50 mg by mouth daily.    [provider]  sucralfate (CARAFATE) 1 GM/10ML suspension Take 1 g by mouth in the morning, at noon, in the evening, and at bedtime. (0800, 1200, 1700 & 2100)    [provider]  UNABLE TO FIND Dysphagia 3 Diet    [provider]    Current Medications Scheduled Meds:  acyclovir  200 mg Oral q AM   anastrozole  1 mg Oral Daily   Chlorhexidine Gluconate Cloth  6 each Topical Daily   melatonin  6 mg Oral QHS   vancomycin variable dose per unstable renal function (pharmacist dosing)   Does not apply See admin instructions   Continuous Infusions:  ceFEPime (MAXIPIME) IV     PRN Meds:.acetaminophen **OR** acetaminophen, albuterol, hydrALAZINE, ondansetron **OR** ondansetron (ZOFRAN) IV, mouth rinse  CBC Recent Labs  Lab 12/16/22 1807 12/17/22 0434  WBC 6.7 4.5  NEUTROABS 5.9  --   HGB 12.4 10.4*  HCT 38.3 32.3*  MCV 101.3* 101.9*  PLT 201 129*   Basic Metabolic Panel Recent Labs  Lab 12/16/22 1807 12/17/22 0434  NA 133* 133*  K 4.1 4.6  CL 93* 95*  CO2 24 24  GLUCOSE 123* 72  BUN 22 28*  CREATININE 4.81* 6.00*  CALCIUM 9.4 9.4    Physical Exam  Blood pressure 132/71, pulse 93, temperature 98.3 F (36.8 C), resp. rate 20, height 5\' 3"  (1.6 m), weight 62.1 kg, SpO2 100%. GEN: Elderly female, no distress, awake and alert ENT: NCAT, poor dentition EYES: EOMI CV: 3/6 MSM loudest at left lower sternal border PULM: Diminished in the bases, otherwise clear, normal work of breathing ABD: Soft, nontender SKIN: Left IJ TDC without tenderness, erythema, identified purulence EXT: No significant edema NEURO: Alert and oriented to self, location, day, month; not date   Assessment 22F ESRD MWF L internal jugular TDC with AMS and Fever.  ESRD MWF on schedule.  ?[facility] AMS with Fever: Better today.  Empiric vancomycin and cefepime.  Blood cultures pending.  Workup negative thus far.  No obvious involvement of TDC but if bacteremia present will need  catheter holiday; moderate probability Anemia: Hemoglobin at target, trend CKD-BMD: Calcium stable.  No inpatient issues, trend outpatient management Hypertension/volume: Stable blood pressures, no overt hypervolemia, 1 to 2 L UF tomorrow as BP permits Myeloma under the care of oncology; receiving Velcade History of dementia  Plan As above, HD tomorrow  Carly Wood  12/17/2022, 9:44 AM

## 2022-12-17 NOTE — TOC Initial Note (Signed)
Transition of Care [facility]) - Initial/Assessment Note    Patient Details  Name: Carly Wood MRN: [id number] Date of Birth: [birth year]  Transition of Care [facility]) CM/SW Contact:    Erin Sons, LCSW Phone Number: 12/17/2022, 1:53 PM  Clinical Narrative:                  CSW confirmed with [facility] that pt is a LTC resident there. Per PT, pt could benefit from rehab services at DC. Will need to initiate insurance auth prior to DC for rehab services. TOC will continue to follow.   Expected Discharge Plan: Skilled Nursing Facility Barriers to Discharge: Continued Medical Work up   Patient Goals and CMS Choice            Expected Discharge Plan and Services                                              Prior Living Arrangements/Services   Lives with:: Facility Resident                   Activities of Daily Living   ADL Screening (condition at time of admission) Independently performs ADLs?: No Does the patient have a NEW difficulty with bathing/dressing/toileting/self-feeding that is expected to last >3 days?: Yes (Initiates electronic notice to provider for possible OT consult) Does the patient have a NEW difficulty with getting in/out of bed, walking, or climbing stairs that is expected to last >3 days?: Yes (Initiates electronic notice to provider for possible PT consult) Does the patient have a NEW difficulty with communication that is expected to last >3 days?: No Is the patient deaf or have difficulty hearing?: No Does the patient have difficulty seeing, even when wearing glasses/contacts?: No Does the patient have difficulty concentrating, remembering, or making decisions?: Yes  Permission Sought/Granted                  Emotional Assessment         Alcohol / Substance Use: Not Applicable Psych Involvement: No (comment)  Admission diagnosis:  Sepsis (HCC) [A41.9] Sepsis, due to unspecified organism, unspecified whether acute organ  dysfunction present [facility]) [A41.9] Patient Active Problem List   Diagnosis Date Noted   Sepsis (HCC) 12/16/2022   Chronic generalized pain 08/21/2022   Numbness and tingling in both hands 07/16/2022   Post-menopausal osteoporosis 12/11/2021   Elevated LFTs 10/03/2021   Syncope 10/01/2021   Elevated liver enzymes 10/01/2021   Breast cancer (HCC) 10/01/2021   Calculus of gallbladder without cholecystitis without obstruction    History of hip fracture 07/03/2021   Edema 02/06/2021   Type 2 diabetes mellitus with hypertension and end stage renal disease on dialysis (HCC) 12/19/2020   Protein-calorie malnutrition, severe (HCC) 07/31/2020   Duodenal ulcer    Acute upper GI bleed 07/22/2020   Rectal bleeding 07/22/2020   Major depression, recurrent, chronic (HCC) 07/18/2020   ESRD on hemodialysis (HCC) 07/13/2020   Hyperlipidemia associated with type 2 diabetes mellitus (HCC) 06/19/2020   Aortic atherosclerosis (HCC) 04/01/2020   Atherosclerotic peripheral vascular disease (HCC) 04/01/2020   Hyperkalemia 12/25/2019   Dependence on renal dialysis (HCC) 10/25/2019   Chronic kidney disease with end stage renal disease on dialysis due to type 2 diabetes mellitus (HCC) 10/19/2019   Hypertension due to end stage renal disease caused by type 2 diabetes mellitus,  on dialysis (HCC) 10/19/2019   Anemia due to end stage renal disease (HCC) 10/19/2019   Herpes 10/19/2019   Vascular dementia with behavior disturbance (HCC) 10/19/2019   Diabetes mellitus with ESRD (end-stage renal disease) (HCC) 10/12/2019   Multiple myeloma (HCC) 10/04/2018   MGUS (monoclonal gammopathy of unknown significance) 09/20/2018   Iron deficiency anemia 05/03/2017   Multiple gastric ulcers 11/10/2016   Dysphagia 07/31/2016   GERD (gastroesophageal reflux disease) 07/31/2016   Lymphedema of arm 08/22/2015   Stage 1 infiltrating ductal carcinoma of right female breast (HCC) 08/21/2015   PCP:  Sharee Holster,  NP Pharmacy:   [facility] - [place],  - [address] [place] [address] Phone: [phone] Fax: [phone]  [facility] - [place], [place] - [address] [place] [address] Phone: [phone] Fax: [phone]     Social Determinants of Health (SDOH) Social History: SDOH Screenings   Food Insecurity: No Food Insecurity (12/16/2022)  Housing: Low Risk  (12/16/2022)  Transportation Needs: No Transportation Needs (12/16/2022)  Utilities: Not At Risk (12/16/2022)  Alcohol Screen: Low Risk  (01/09/2020)  Depression (PHQ2-9): Low Risk  (03/13/2022)  Financial Resource Strain: Low Risk  (01/09/2020)  Physical Activity: Inactive (01/09/2020)  Social Connections: Moderately Isolated (01/09/2020)  Stress: No Stress Concern Present (01/09/2020)  Tobacco Use: Low Risk  (11/24/2022)   SDOH Interventions:     Readmission Risk Interventions    10/03/2021   12:36 PM 07/25/2020    2:09 PM 07/23/2020   10:59 AM  Readmission Risk Prevention Plan  Transportation Screening Complete Complete Complete  PCP or Specialist Appt within 3-5 Days Complete    HRI or Home Care Consult Complete    Social Work Consult for Recovery Care Planning/Counseling Complete    Palliative Care Screening Complete    Medication Review Oceanographer) Complete  Complete  HRI or Home Care Consult   Complete  Palliative Care Screening   Not Applicable  Skilled Nursing Facility   Complete

## 2022-12-17 NOTE — Progress Notes (Addendum)
[facility] PROGRESS NOTE   Carly Wood ZOX:[id number] DOB: Nov 08, 1935 DOA: 12/16/2022  PCP: Sharee Holster, NP  Brief History: 87 y.o. female,  with medical history significant for ESRD, hypertension, diabetes mellitus, dementia, multiple myeloma.  Patient presented to ED secondary to complaints of fever, chills, altered mental status at HD center. Patient with known history of multiple myeloma, she is on Velcade infusion biweekly.  Patient was noted to be febrile.  Started on broad-spectrum antibiotics.  Hospitalized for further management.  Consultants: Nephrology  Procedures: None yet    Subjective/Interval History: Patient noted to be pleasantly confused.  Denies any pain.  Does not appear to be in any discomfort.    Assessment/Plan:  Fever with SIRS criteria Patient presented with fever tachypnea and tachycardia.  She was also noted to be altered.  Source of infection is not clear. Chest x-ray does not show any acute findings.  Patient does not have any respiratory symptoms.  COVID-19 is negative. Abdomen is benign. LFTs were unremarkable yesterday.  Neutrophil counts were normal yesterday. Blood cultures are pending. Continue with broad-spectrum antibiotics for now.  Noted to be on vancomycin and cefepime. ADDENDUM: GPC in 1/2 blood cultures. Will repeat Blood c/s. Await identification. Will need to involve ID eventually considering presence of HD catheter.   End-stage renal disease on hemodialysis, Monday Wednesday Friday/hyponatremia Nephrology will be consulted.  She did undergo dialysis yesterday.  Due for dialysis tomorrow.  She has a HD catheter in the left chest.  History of multiple myeloma Follows with medical oncology here in [place].  She is on Velcade as outpatient.  Will notify oncology of this hospitalization.  History of breast cancer On anastrozole.  Macrocytic anemia No evidence of overt bleeding.  Check anemia  panel.  Thrombocytopenia Drop in platelet counts noted.  Will stop the heparin.  Recheck labs tomorrow.  History of diabetes mellitus type 2 Not noted to be on any medications for same.  Monitor glucose levels periodically.   DVT Prophylaxis: SCDs for now Code Status: DNR Family Communication: No family at bedside.  Discussed with patient Disposition Plan: To be determined.  She mentions that she lives in senior citizen complex  Status is: Inpatient Remains inpatient appropriate because: Fever of unknown origin, end-stage renal disease      Medications: Scheduled:  acyclovir  200 mg Oral q AM   anastrozole  1 mg Oral Daily   Chlorhexidine Gluconate Cloth  6 each Topical Daily   melatonin  6 mg Oral QHS   vancomycin variable dose per unstable renal function (pharmacist dosing)   Does not apply See admin instructions   Continuous:  ceFEPime (MAXIPIME) IV     WJX:BJYNWGNFAOZHY **OR** acetaminophen, albuterol, hydrALAZINE, ondansetron **OR** ondansetron (ZOFRAN) IV, mouth rinse  Antibiotics: Anti-infectives (From admission, onward)    Start     Dose/Rate Route Frequency Ordered Stop   12/18/22 1800  Levofloxacin (LEVAQUIN) IVPB 250 mg  Status:  Discontinued        250 mg 50 mL/hr over 60 Minutes Intravenous Every 48 hours 12/16/22 1929 12/16/22 2157   12/17/22 2200  ceFEPIme (MAXIPIME) 1 g in sodium chloride 0.9 % 100 mL IVPB        1 g 200 mL/hr over 30 Minutes Intravenous Every 24 hours 12/16/22 2224     12/17/22 0700  acyclovir (ZOVIRAX) 200 MG capsule 200 mg       Note to Pharmacy: (0800)     200 mg Oral Every morning 12/16/22  2226     12/16/22 2230  metroNIDAZOLE (FLAGYL) IVPB 500 mg  Status:  Discontinued        500 mg 100 mL/hr over 60 Minutes Intravenous Every 12 hours 12/16/22 2216 12/16/22 2236   12/16/22 2215  vancomycin (VANCOCIN) IVPB 1000 mg/200 mL premix        1,000 mg 200 mL/hr over 60 Minutes Intravenous  Once 12/16/22 2210 12/17/22 0017   12/16/22  2210  vancomycin variable dose per unstable renal function (pharmacist dosing)         Does not apply See admin instructions 12/16/22 2210     12/16/22 2200  ceFEPIme (MAXIPIME) 2 g in sodium chloride 0.9 % 100 mL IVPB        2 g 200 mL/hr over 30 Minutes Intravenous  Once 12/16/22 2153 12/16/22 2250   12/16/22 1800  levofloxacin (LEVAQUIN) IVPB 750 mg        750 mg 100 mL/hr over 90 Minutes Intravenous  Once 12/16/22 1759 12/16/22 2206       Objective:  Vital Signs  Vitals:   12/16/22 2158 12/16/22 2218 12/16/22 2259 12/17/22 0331  BP: 125/71 128/71 122/68 132/71  Pulse:  87 88 93  Resp: (!) 21 (!) 22 (!) 24 20  Temp:  98.3 F (36.8 C) 99 F (37.2 C) 98.3 F (36.8 C)  TempSrc:  Oral    SpO2:  97% 99% 100%  Weight:   62.1 kg   Height:       No intake or output data in the 24 hours ending 12/17/22 0918 Filed Weights   12/16/22 1727 12/16/22 2259  Weight: 63.5 kg 62.1 kg    General appearance: Awake alert.  In no distress.  Pleasantly confused Resp: Clear to auscultation bilaterally.  Normal effort Cardio: S1-S2 is normal regular.  No S3-S4.  No rubs murmurs or bruit GI: Abdomen is soft.  Nontender nondistended.  Bowel sounds are present normal.  No masses organomegaly Extremities: Mild edema bilateral lower extremities.  No erythema noted.   Neurologic:  No focal neurological deficits.    Lab Results:  Data Reviewed: I have personally reviewed following labs and reports of the imaging studies  CBC: Recent Labs  Lab 12/16/22 1807 12/17/22 0434  WBC 6.7 4.5  NEUTROABS 5.9  --   HGB 12.4 10.4*  HCT 38.3 32.3*  MCV 101.3* 101.9*  PLT 201 129*    Basic Metabolic Panel: Recent Labs  Lab 12/16/22 1807 12/17/22 0434  NA 133* 133*  K 4.1 4.6  CL 93* 95*  CO2 24 24  GLUCOSE 123* 72  BUN 22 28*  CREATININE 4.81* 6.00*  CALCIUM 9.4 9.4    GFR: Estimated Creatinine Clearance: 5.8 mL/min (A) (by C-G formula based on SCr of 6 mg/dL (H)).  Liver Function  Tests: Recent Labs  Lab 12/16/22 1807  AST 18  ALT 14  ALKPHOS 80  BILITOT 0.6  PROT 7.9  ALBUMIN 4.1    Recent Labs  Lab 12/16/22 1807  LIPASE 82*   Coagulation Profile: Recent Labs  Lab 12/16/22 1807  INR 1.1    CBG: Recent Labs  Lab 12/16/22 1946 12/17/22 0732  GLUCAP 169* 89     Recent Results (from the past 240 hour(s))  Blood Culture (routine x 2)     Status: None (Preliminary result)   Collection Time: 12/16/22  6:07 PM   Specimen: BLOOD  Result Value Ref Range Status   Specimen Description BLOOD BLOOD RIGHT ARM  Final   Special Requests   Final    Blood Culture results may not be optimal due to an inadequate volume of blood received in culture bottles BOTTLES DRAWN AEROBIC AND ANAEROBIC   Culture   Final    NO GROWTH < 24 HOURS Performed at [facility], [address], [place], [place] [address]    Report Status PENDING  Incomplete  Blood Culture (routine x 2)     Status: None (Preliminary result)   Collection Time: 12/16/22  7:03 PM   Specimen: BLOOD  Result Value Ref Range Status   Specimen Description BLOOD BLOOD RIGHT ARM  Final   Special Requests   Final    BOTTLES DRAWN AEROBIC AND ANAEROBIC Blood Culture results may not be optimal due to an excessive volume of blood received in culture bottles   Culture   Final    NO GROWTH < 12 HOURS Performed at [facility], [address] [place], [place] [address]    Report Status PENDING  Incomplete  Resp panel by RT-PCR (RSV, Flu A&B, Covid) Anterior Nasal Swab     Status: None   Collection Time: 12/16/22  7:42 PM   Specimen: Anterior Nasal Swab  Result Value Ref Range Status   SARS Coronavirus 2 by RT PCR NEGATIVE NEGATIVE Final    Comment: (NOTE) SARS-CoV-2 target nucleic acids are NOT DETECTED.  The SARS-CoV-2 RNA is generally detectable in upper respiratory specimens during the acute phase of infection. The lowest concentration of SARS-CoV-2 viral copies this assay can detect is 138  copies/mL. A negative result does not preclude SARS-Cov-2 infection and should not be used as the sole basis for treatment or other patient management decisions. A negative result may occur with  improper specimen collection/handling, submission of specimen other than nasopharyngeal swab, presence of viral mutation(s) within the areas targeted by this assay, and inadequate number of viral copies(<138 copies/mL). A negative result must be combined with clinical observations, patient history, and epidemiological information. The expected result is Negative.  Fact Sheet for Patients:  BloggerCourse.com  Fact Sheet for Healthcare Providers:  SeriousBroker.it  This test is no t yet approved or cleared by the [place] FDA and  has been authorized for detection and/or diagnosis of SARS-CoV-2 by FDA under an Emergency Use Authorization (EUA). This EUA will remain  in effect (meaning this test can be used) for the duration of the COVID-19 declaration under Section 564(b)(1) of the Act, 21 U.S.C.section 360bbb-3(b)(1), unless the authorization is terminated  or revoked sooner.       Influenza A by PCR NEGATIVE NEGATIVE Final   Influenza B by PCR NEGATIVE NEGATIVE Final    Comment: (NOTE) The Xpert Xpress SARS-CoV-2/FLU/RSV plus assay is intended as an aid in the diagnosis of influenza from Nasopharyngeal swab specimens and should not be used as a sole basis for treatment. Nasal washings and aspirates are unacceptable for Xpert Xpress SARS-CoV-2/FLU/RSV testing.  Fact Sheet for Patients: BloggerCourse.com  Fact Sheet for Healthcare Providers: SeriousBroker.it  This test is not yet approved or cleared by the [place] FDA and has been authorized for detection and/or diagnosis of SARS-CoV-2 by FDA under an Emergency Use Authorization (EUA). This EUA will remain in effect (meaning  this test can be used) for the duration of the COVID-19 declaration under Section 564(b)(1) of the Act, 21 U.S.C. section 360bbb-3(b)(1), unless the authorization is terminated or revoked.     Resp Syncytial Virus by PCR NEGATIVE NEGATIVE Final    Comment: (  NOTE) Fact Sheet for Patients: BloggerCourse.com  Fact Sheet for Healthcare Providers: SeriousBroker.it  This test is not yet approved or cleared by the [place] FDA and has been authorized for detection and/or diagnosis of SARS-CoV-2 by FDA under an Emergency Use Authorization (EUA). This EUA will remain in effect (meaning this test can be used) for the duration of the COVID-19 declaration under Section 564(b)(1) of the Act, 21 U.S.C. section 360bbb-3(b)(1), unless the authorization is terminated or revoked.  Performed at [facility], [address] [place], [place] [address]       Radiology Studies: [facility] Chest [facility] 1 View  Result Date: 12/16/2022 CLINICAL DATA:  Cough, altered mental status. EXAM: PORTABLE CHEST 1 VIEW COMPARISON:  10/01/2021. FINDINGS: The heart is enlarged and the mediastinal contour is within normal limits. Atherosclerotic calcification of the aorta is noted. Lung volumes are low. No consolidation, effusion, or pneumothorax. A left internal jugular central venous catheter appear stable. Surgical clips are present in the right axilla. No acute osseous abnormality. IMPRESSION: No active disease. Electronically Signed   By: Thornell Sartorius M.D.   On: 12/16/2022 20:29       LOS: 1 day   Osvaldo Shipper  [facility] Pager on [email or web address]  12/17/2022, 9:18 AM

## 2022-12-17 NOTE — Plan of Care (Signed)
  Problem: Clinical Measurements: ?Goal: Cardiovascular complication will be avoided ?Outcome: Progressing ?  ?Problem: Activity: ?Goal: Risk for activity intolerance will decrease ?Outcome: Progressing ?  ?Problem: Pain Managment: ?Goal: General experience of comfort will improve ?Outcome: Progressing ?  ?

## 2022-12-17 NOTE — Plan of Care (Signed)
  Problem: Acute Rehab PT Goals(only PT should resolve) Goal: Pt Will Go Supine/Side To Sit Outcome: Progressing Flowsheets (Taken 12/17/2022 1425) Pt will go Supine/Side to Sit:  with supervision  with contact guard assist Goal: Patient Will Transfer Sit To/From Stand Outcome: Progressing Flowsheets (Taken 12/17/2022 1425) Patient will transfer sit to/from stand: with contact guard assist Goal: Pt Will Transfer Bed To Chair/Chair To Bed Outcome: Progressing Flowsheets (Taken 12/17/2022 1425) Pt will Transfer Bed to Chair/Chair to Bed: with contact guard assist Goal: Pt Will Ambulate Outcome: Progressing   2:26 PM, 12/17/22 Ocie Bob, MPT Physical Therapist with [facility] [phone] office [phone] mobile phone

## 2022-12-17 NOTE — Evaluation (Signed)
Physical Therapy Evaluation Patient Details Name: MACHELE HAILSTONE MRN: [id number] DOB: 1936/01/17 Today's Date: 12/17/2022  History of Present Illness  Gionna Ecke  is a 87 y.o. female,  with medical history significant for ESRD, hypertension, diabetes mellitus, dementia, multiple myeloma.  Patient presents to ED secondary to complaints of fever, chills, altered mental status at HD center, patient reports her arm feels heavy, she reports some headache, overall she is a poor historian, she denies any chest pain, shortness of breath, does not make urine, otherwise she cannot give any specific complaints.  Patient with known history of multiple myeloma, she is on Velcade infusion biweekly.   Clinical Impression  Patient demonstrates fair/good return for sitting at bedside with labored movement, had difficulty balancing on feet during stand pivot transfer chair leaning on armrest of chair for support.  Patient tolerated sitting up in chair after therapy.  Patient will benefit from continued skilled physical therapy in hospital and recommended venue below to increase strength, balance, endurance for safe ADLs and gait.       If plan is discharge home, recommend the following: A lot of help with bathing/dressing/bathroom;A lot of help with walking and/or transfers;Help with stairs or ramp for entrance;Assistance with cooking/housework   Can travel by private vehicle   No    Equipment Recommendations None recommended by PT  Recommendations for Other Services       Functional Status Assessment Patient has had a recent decline in their functional status and demonstrates the ability to make significant improvements in function in a reasonable and predictable amount of time.     Precautions / Restrictions Precautions Precautions: Fall Restrictions Weight Bearing Restrictions: No      Mobility  Bed Mobility Overal bed mobility: Needs Assistance Bed Mobility: Supine to Sit     Supine to  sit: Contact guard, Min assist     General bed mobility comments: required use of bed rail for sitting up at bedside    Transfers Overall transfer level: Needs assistance Equipment used: 1 person hand held assist Transfers: Sit to/from Stand, Bed to chair/wheelchair/BSC Sit to Stand: Min assist, Mod assist   Step pivot transfers: Min assist, Mod assist       General transfer comment: slow labored momement with flexed trunk. had to lean on armrest of chair for support during transfer    Ambulation/Gait                  Stairs            Wheelchair Mobility     Tilt Bed    Modified Rankin (Stroke Patients Only)       Balance Overall balance assessment: Needs assistance Sitting-balance support: Feet supported, No upper extremity supported Sitting balance-Leahy Scale: Fair Sitting balance - Comments: fair/good seated at EOB   Standing balance support: Reliant on assistive device for balance, During functional activity, Bilateral upper extremity supported Standing balance-Leahy Scale: Poor Standing balance comment: leaning on armest of chair                             Pertinent Vitals/Pain Pain Assessment Pain Assessment: Faces Faces Pain Scale: Hurts a little bit Pain Location: feet Pain Descriptors / Indicators: Sore Pain Intervention(s): Limited activity within patient's tolerance, Monitored during session, Repositioned    Home Living Family/patient expects to be discharged to:: Skilled nursing facility  Prior Function Prior Level of Function : Needs assist       Physical Assist : Mobility (physical);ADLs (physical) Mobility (physical): Bed mobility;Transfers;Gait;Stairs   Mobility Comments: Modified Independent transfers, uses wheelchair for mobility, does not walk ADLs Comments: assisted by SNF staff     Extremity/Trunk Assessment   Upper Extremity Assessment Upper Extremity Assessment:  Generalized weakness    Lower Extremity Assessment Lower Extremity Assessment: Generalized weakness    Cervical / Trunk Assessment Cervical / Trunk Assessment: Kyphotic  Communication   Communication Communication: No apparent difficulties  Cognition Arousal: Alert Behavior During Therapy: WFL for tasks assessed/performed Overall Cognitive Status: History of cognitive impairments - at baseline                                          General Comments      Exercises     Assessment/Plan    PT Assessment Patient needs continued PT services  PT Problem List Decreased strength;Decreased activity tolerance;Decreased balance;Decreased mobility       PT Treatment Interventions DME instruction;Functional mobility training;Therapeutic activities;Balance training;Wheelchair mobility training;Therapeutic exercise;Patient/family education    PT Goals (Current goals can be found in the Care Plan section)  Acute Rehab PT Goals Patient Stated Goal: return home with SNF staff to assist PT Goal Formulation: With patient Time For Goal Achievement: 12/31/22 Potential to Achieve Goals: Good    Frequency Min 3X/week     Co-evaluation               AM-PAC PT "6 Clicks" Mobility  Outcome Measure Help needed turning from your back to your side while in a flat bed without using bedrails?: A Little Help needed moving from lying on your back to sitting on the side of a flat bed without using bedrails?: A Little Help needed moving to and from a bed to a chair (including a wheelchair)?: A Lot Help needed standing up from a chair using your arms (e.g., wheelchair or bedside chair)?: A Lot Help needed to walk in hospital room?: Total Help needed climbing 3-5 steps with a railing? : Total 6 Click Score: 12    End of Session   Activity Tolerance: Patient tolerated treatment well;Patient limited by fatigue Patient left: in chair;with call bell/phone within reach;with  chair alarm set Nurse Communication: Mobility status PT Visit Diagnosis: Unsteadiness on feet (R26.81);Other abnormalities of gait and mobility (R26.89);Muscle weakness (generalized) (M62.81)    Time: 6962-9528 PT Time Calculation (min) (ACUTE ONLY): 20 min   Charges:   PT Evaluation $PT Eval Moderate Complexity: 1 Mod PT Treatments $Therapeutic Activity: 8-22 mins PT General Charges $$ ACUTE PT VISIT: 1 Visit         2:24 PM, 12/17/22 Ocie Bob, MPT Physical Therapist with [facility] 336 [phone] office [phone] mobile phone

## 2022-12-18 ENCOUNTER — Inpatient Hospital Stay (HOSPITAL_COMMUNITY): Payer: Medicare PPO

## 2022-12-18 ENCOUNTER — Other Ambulatory Visit (HOSPITAL_COMMUNITY): Payer: Medicare PPO

## 2022-12-18 ENCOUNTER — Encounter (HOSPITAL_COMMUNITY): Payer: Self-pay | Admitting: Internal Medicine

## 2022-12-18 DIAGNOSIS — R7881 Bacteremia: Secondary | ICD-10-CM

## 2022-12-18 DIAGNOSIS — N186 End stage renal disease: Secondary | ICD-10-CM | POA: Diagnosis not present

## 2022-12-18 DIAGNOSIS — T829XXA Unspecified complication of cardiac and vascular prosthetic device, implant and graft, initial encounter: Secondary | ICD-10-CM | POA: Diagnosis not present

## 2022-12-18 DIAGNOSIS — D631 Anemia in chronic kidney disease: Secondary | ICD-10-CM | POA: Diagnosis not present

## 2022-12-18 DIAGNOSIS — Z992 Dependence on renal dialysis: Secondary | ICD-10-CM | POA: Diagnosis not present

## 2022-12-18 LAB — BLOOD CULTURE ID PANEL (REFLEXED) - BCID2

## 2022-12-18 LAB — RETICULOCYTES
Immature Retic Fract: 13.8 % (ref 2.3–15.9)
RBC.: 3.07 MIL/uL — ABNORMAL LOW (ref 3.87–5.11)
Retic Count, Absolute: 37.5 10*3/uL (ref 19.0–186.0)
Retic Ct Pct: 1.2 % (ref 0.4–3.1)

## 2022-12-18 LAB — CBC WITH DIFFERENTIAL/PLATELET
Abs Immature Granulocytes: 0.02 10*3/uL (ref 0.00–0.07)
Basophils Absolute: 0 10*3/uL (ref 0.0–0.1)
Basophils Relative: 1 %
Eosinophils Absolute: 0.2 10*3/uL (ref 0.0–0.5)
Eosinophils Relative: 4 %
HCT: 30.4 % — ABNORMAL LOW (ref 36.0–46.0)
Hemoglobin: 10 g/dL — ABNORMAL LOW (ref 12.0–15.0)
Immature Granulocytes: 1 %
Lymphocytes Relative: 19 %
Lymphs Abs: 0.8 10*3/uL (ref 0.7–4.0)
MCH: 33.3 pg (ref 26.0–34.0)
MCHC: 32.9 g/dL (ref 30.0–36.0)
MCV: 101.3 fL — ABNORMAL HIGH (ref 80.0–100.0)
Monocytes Absolute: 0.8 10*3/uL (ref 0.1–1.0)
Monocytes Relative: 20 %
Neutro Abs: 2.3 10*3/uL (ref 1.7–7.7)
Neutrophils Relative %: 55 %
Platelets: 150 10*3/uL (ref 150–400)
RBC: 3 MIL/uL — ABNORMAL LOW (ref 3.87–5.11)
RDW: 15.4 % (ref 11.5–15.5)
WBC: 4.2 10*3/uL (ref 4.0–10.5)
nRBC: 0 % (ref 0.0–0.2)

## 2022-12-18 LAB — IRON AND TIBC
Iron: 40 ug/dL (ref 28–170)
Saturation Ratios: 36 % — ABNORMAL HIGH (ref 10.4–31.8)
TIBC: 110 ug/dL — ABNORMAL LOW (ref 250–450)
UIBC: 70 ug/dL

## 2022-12-18 LAB — RENAL FUNCTION PANEL
Albumin: 3.2 g/dL — ABNORMAL LOW (ref 3.5–5.0)
Anion gap: 15 (ref 5–15)
BUN: 60 mg/dL — ABNORMAL HIGH (ref 8–23)
CO2: 24 mmol/L (ref 22–32)
Calcium: 8.5 mg/dL — ABNORMAL LOW (ref 8.9–10.3)
Chloride: 92 mmol/L — ABNORMAL LOW (ref 98–111)
Creatinine, Ser: 8.82 mg/dL — ABNORMAL HIGH (ref 0.44–1.00)
GFR, Estimated: 4 mL/min — ABNORMAL LOW (ref >60)
Glucose, Bld: 149 mg/dL — ABNORMAL HIGH (ref 70–99)
Phosphorus: 7.9 mg/dL — ABNORMAL HIGH (ref 2.5–4.6)
Potassium: 4.9 mmol/L (ref 3.5–5.1)
Sodium: 131 mmol/L — ABNORMAL LOW (ref 135–145)

## 2022-12-18 LAB — VITAMIN B12: Vitamin B-12: 462 pg/mL (ref 180–914)

## 2022-12-18 LAB — HEPATITIS B SURFACE ANTIGEN: Hepatitis B Surface Ag: NONREACTIVE

## 2022-12-18 LAB — FOLATE: Folate: 35.9 ng/mL (ref >5.9)

## 2022-12-18 LAB — GLUCOSE, CAPILLARY: Glucose-Capillary: 94 mg/dL (ref 70–99)

## 2022-12-18 LAB — FERRITIN: Ferritin: 277 ng/mL (ref 11–307)

## 2022-12-18 MED ORDER — LIDOCAINE HCL 1 % IJ SOLN
20.0000 mL | Freq: Once | INTRAMUSCULAR | Status: AC
  Administered 2022-12-18: 6 mL via INTRADERMAL
  Filled 2022-12-18: qty 20

## 2022-12-18 NOTE — Progress Notes (Signed)
Admit: 12/16/2022 LOS: 2  54F ESRD MWF L internal jugular TDC with AMS and Fever; staphylococcal bateremia  Subjective:  GPC 1/2 on 10/9 BCx and rapid ID is S. Aureus Afebrile, blood pressure stable For dialysis today  10/10 0701 - 10/11 0700 In: 340 [P.O.:340] Out: -   Filed Weights   12/16/22 1727 12/16/22 2259  Weight: 63.5 kg 62.1 kg    Scheduled Meds:  acyclovir  200 mg Oral q AM   anastrozole  1 mg Oral Daily   Chlorhexidine Gluconate Cloth  6 each Topical Daily   melatonin  6 mg Oral QHS   Continuous Infusions:  ceFEPime (MAXIPIME) IV     vancomycin     PRN Meds:.acetaminophen **OR** acetaminophen, albuterol, hydrALAZINE, ondansetron **OR** ondansetron (ZOFRAN) IV, mouth rinse  Current Labs: reviewed   Physical Exam:  Blood pressure (!) 147/67, pulse 65, temperature 98.2 F (36.8 C), resp. rate 20, height 5\' 3"  (1.6 m), weight 62.1 kg, SpO2 97%. GEN: Elderly female, no distress, awake and alert ENT: NCAT, poor dentition EYES: EOMI CV: 3/6 MSM loudest at left lower sternal border PULM: Diminished in the bases, otherwise clear, normal work of breathing ABD: Soft, nontender SKIN: Left IJ TDC without tenderness, erythema, identified purulence EXT: No significant edema NEURO: Alert and oriented to self, location, day, month; not date  A ESRD MWF on schedule.  ?[facility] using L internal jugular TDC Staphylococcal bacteremia; AMS with Fever: Clinically stable.  Plan for line holiday after dialysis today, discussed with surgery for [facility] removal today; tentative replacement 10/14 of bacteremia Anemia: Hemoglobin at target, trend CKD-BMD: Calcium stable.  No inpatient issues, trend outpatient management Hypertension/volume: Stable blood pressures, no overt hypervolemia, 1 to 2 L UF today as BP permits Myeloma under the care of oncology; receiving Velcade History of dementia Mild hyponatremia, will correct with dialysis, trend  P As above, HD today followed by [facility]  removal We will not formally see over the weekend.  Please call for any questions or concerns. Anticipate placement of [facility] 10/14 followed by dialysis. Medication Issues; Preferred narcotic agents for pain control are hydromorphone, fentanyl, and methadone. Morphine should not be used.  Baclofen should be avoided Avoid oral sodium phosphate and magnesium citrate based laxatives / bowel preps    Sabra Heck MD 12/18/2022, 9:27 AM  Recent Labs  Lab 12/16/22 1807 12/17/22 0434  NA 133* 133*  K 4.1 4.6  CL 93* 95*  CO2 24 24  GLUCOSE 123* 72  BUN 22 28*  CREATININE 4.81* 6.00*  CALCIUM 9.4 9.4   Recent Labs  Lab 12/16/22 1807 12/17/22 0434 12/17/22 0530 12/18/22 0429  WBC 6.7 4.5  --  4.2  NEUTROABS 5.9  --  3.1 2.3  HGB 12.4 10.4*  --  10.0*  HCT 38.3 32.3*  --  30.4*  MCV 101.3* 101.9*  --  101.3*  PLT 201 129*  --  150

## 2022-12-18 NOTE — Progress Notes (Signed)
   HEMODIALYSIS TREATMENT NOTE:  Uneventful 3.5 hour heparin-free treatment completed using left chest wall TDC. Goal met: 1.5 liters removed without interruption in UF.  Vancomycin and Cefepime were given during last hour / at end of HD session. All blood was returned.  Dr. Henreitta Leber to removal pt's [facility] imminently.     12/18/22 1430  Vitals  Temp 98.1 F (36.7 C)  Temp Source Oral  BP (!) 148/71  MAP (mmHg) 93  BP Location Right Wrist  BP Method Automatic  Patient Position (if appropriate) Lying  Pulse Rate 78  Pulse Rate Source Monitor  ECG Heart Rate 80  Resp 18  Oxygen Therapy  SpO2 100 %  O2 Device Room Air  Post Treatment  Dialyzer Clearance Lightly streaked  Hemodialysis Intake (mL) 0 mL  Liters Processed 82  Fluid Removed (mL) 1500 mL  Tolerated HD Treatment Yes  Post-Hemodialysis Comments Goal met  Hemodialysis Catheter Left Subclavian  No placement date or time found.   Placed prior to admission: Yes  Orientation: Left  Access Location: Subclavian  Blue Lumen Status Flushed;Dead end cap in place  Red Lumen Status Flushed;Dead end cap in place  Purple Lumen Status N/A  Catheter fill solution  (saline locked - cath removal imminent)  Catheter fill volume (Arterial) 1.9 cc  Catheter fill volume (Venous) 1.9  Dressing Type Transparent;Tube stabilization device  Dressing Status Antimicrobial disc in place;Clean, Dry, Intact  Interventions Dressing reinforced  Drainage Description None  Dressing Change Due  (n/a - cath removal is imminent.  surgeon has been paged)  Post treatment catheter status Capped and Clamped     12/18/22 1430  Vitals  Temp 98.1 F (36.7 C)  Temp Source Oral  BP (!) 148/71  MAP (mmHg) 93  BP Location Right Wrist  BP Method Automatic  Patient Position (if appropriate) Lying  Pulse Rate 78  Pulse Rate Source Monitor  ECG Heart Rate 80  Resp 18  Oxygen Therapy  SpO2 100 %  O2 Device Room Air  Post Treatment  Dialyzer Clearance  Lightly streaked  Hemodialysis Intake (mL) 0 mL  Liters Processed 82  Fluid Removed (mL) 1500 mL  Tolerated HD Treatment Yes  Post-Hemodialysis Comments Goal met  Hemodialysis Catheter Left Subclavian  No placement date or time found.   Placed prior to admission: Yes  Orientation: Left  Access Location: Subclavian  Blue Lumen Status Flushed;Dead end cap in place  Red Lumen Status Flushed;Dead end cap in place  Purple Lumen Status N/A  Catheter fill solution  (saline locked - cath removal imminent)  Catheter fill volume (Arterial) 1.9 cc  Catheter fill volume (Venous) 1.9  Dressing Type Transparent;Tube stabilization device  Dressing Status Antimicrobial disc in place;Clean, Dry, Intact  Interventions Dressing reinforced  Drainage Description None  Dressing Change Due  (n/a - cath removal is imminent.  surgeon has been paged)  Post treatment catheter status Capped and Clamped    Arman Filter, RN AP KDU

## 2022-12-18 NOTE — Progress Notes (Signed)
Attempted Echocardiogram, Patient is in Dialysis.

## 2022-12-18 NOTE — Hospital Course (Signed)
87 y.o. female,  with medical history significant for ESRD, hypertension, diabetes mellitus, dementia, multiple myeloma.  Patient presents to ED secondary to complaints of fever, chills, altered mental status at HD center, patient reports her arm feels heavy, she reports some headache, overall she is a poor historian, she denies any chest pain, shortness of breath, does not make urine, otherwise she cannot give any specific complaints.  Patient with known history of multiple myeloma, she is on Velcade infusion biweekly. -In ED patient was noted to be febrile 101.4, tachypneic, tachycardic, chest x-ray with no acute findings, anuric unable to obtain any urine by in and out,Creatinine at 4.8, BUN at 22, she is having left upper chest HD catheter, blood cultures were sent, she was started on broad-spectrum antibiotics and [facility] hospitalist consulted to admit.

## 2022-12-18 NOTE — [place] FL2 (Signed)
[place] MEDICAID FL2 LEVEL OF CARE FORM     IDENTIFICATION  Patient Name: Carly Wood Birthdate: [birth year] Sex: female Admission Date (Current Location): 12/16/2022  [facility] and [place] Number:  [employer] and Address:  [facility],  [address], [place] [address]      Provider Number: [id number]  Attending Physician Name and Address:  Cleora Fleet, MD  Relative Name and Phone Number:       Current Level of Care: Hospital Recommended Level of Care: Skilled Nursing Facility Prior Approval Number:    Date Approved/Denied:   PASRR Number:    Discharge Plan: SNF    Current Diagnoses: Patient Active Problem List   Diagnosis Date Noted   Sepsis (HCC) 12/16/2022   Chronic generalized pain 08/21/2022   Numbness and tingling in both hands 07/16/2022   Post-menopausal osteoporosis 12/11/2021   Elevated LFTs 10/03/2021   Syncope 10/01/2021   Elevated liver enzymes 10/01/2021   Breast cancer (HCC) 10/01/2021   Calculus of gallbladder without cholecystitis without obstruction    History of hip fracture 07/03/2021   Edema 02/06/2021   Type 2 diabetes mellitus with hypertension and end stage renal disease on dialysis (HCC) 12/19/2020   Protein-calorie malnutrition, severe (HCC) 07/31/2020   Duodenal ulcer    Acute upper GI bleed 07/22/2020   Rectal bleeding 07/22/2020   Major depression, recurrent, chronic (HCC) 07/18/2020   ESRD on hemodialysis (HCC) 07/13/2020   Hyperlipidemia associated with type 2 diabetes mellitus (HCC) 06/19/2020   Aortic atherosclerosis (HCC) 04/01/2020   Atherosclerotic peripheral vascular disease (HCC) 04/01/2020   Hyperkalemia 12/25/2019   Dependence on renal dialysis (HCC) 10/25/2019   Chronic kidney disease with end stage renal disease on dialysis due to type 2 diabetes mellitus (HCC) 10/19/2019   Hypertension due to end stage renal disease caused by type 2 diabetes mellitus, on dialysis (HCC) 10/19/2019    Anemia due to end stage renal disease (HCC) 10/19/2019   Herpes 10/19/2019   Vascular dementia with behavior disturbance (HCC) 10/19/2019   Diabetes mellitus with ESRD (end-stage renal disease) (HCC) 10/12/2019   Multiple myeloma (HCC) 10/04/2018   MGUS (monoclonal gammopathy of unknown significance) 09/20/2018   Iron deficiency anemia 05/03/2017   Multiple gastric ulcers 11/10/2016   Dysphagia 07/31/2016   GERD (gastroesophageal reflux disease) 07/31/2016   Lymphedema of arm 08/22/2015   Stage 1 infiltrating ductal carcinoma of right female breast (HCC) 08/21/2015    Orientation RESPIRATION BLADDER Height & Weight     Self, Time, Situation, Place  Normal Incontinent Weight: 136 lb 14.5 oz (62.1 kg) Height:  5\' 3"  (160 cm)  BEHAVIORAL SYMPTOMS/MOOD NEUROLOGICAL BOWEL NUTRITION STATUS      Incontinent Diet (Renal with 1200 mL fluid restriction)  AMBULATORY STATUS COMMUNICATION OF NEEDS Skin   Extensive Assist Verbally Other (Comment) (redness to left arm)                       Personal Care Assistance Level of Assistance  Bathing, Feeding, Dressing Bathing Assistance: Maximum assistance Feeding assistance: Limited assistance Dressing Assistance: Maximum assistance     Functional Limitations Info  Sight, Hearing, Speech Sight Info: Impaired Hearing Info: Impaired Speech Info: Adequate    SPECIAL CARE FACTORS FREQUENCY  PT (By licensed PT)     PT Frequency: 5x weekly              Contractures      Additional Factors Info  Code Status, Allergies, Psychotropic  Code Status Info: DNR- pre-arrest interventions desired Allergies Info: Ace Inhibitors, Angiotensin Receptor Blockers, Penicillins Psychotropic Info: Zoloft         Current Medications (12/18/2022):  This is the current hospital active medication list Current Facility-Administered Medications  Medication Dose Route Frequency Provider Last Rate Last Admin   acetaminophen (TYLENOL) tablet 650 mg   650 mg Oral Q6H PRN Elgergawy, Leana Roe, MD       Or   acetaminophen (TYLENOL) suppository 650 mg  650 mg Rectal Q6H PRN Elgergawy, Leana Roe, MD       acyclovir (ZOVIRAX) 200 MG capsule 200 mg  200 mg Oral q AM Elgergawy, Leana Roe, MD   200 mg at 12/18/22 0809   albuterol (PROVENTIL) (2.5 MG/3ML) 0.083% nebulizer solution 2.5 mg  2.5 mg Nebulization Q2H PRN Elgergawy, Leana Roe, MD       anastrozole (ARIMIDEX) tablet 1 mg  1 mg Oral Daily Elgergawy, Leana Roe, MD   1 mg at 12/18/22 0809   ceFEPIme (MAXIPIME) 2 g in sodium chloride 0.9 % 100 mL IVPB  2 g Intravenous Q M,W,F-HD Earnie Larsson, RPH       Chlorhexidine Gluconate Cloth 2 % PADS 6 each  6 each Topical Daily Elgergawy, Leana Roe, MD   6 each at 12/18/22 0809   hydrALAZINE (APRESOLINE) injection 5 mg  5 mg Intravenous Q4H PRN Elgergawy, Leana Roe, MD       melatonin tablet 6 mg  6 mg Oral QHS Elgergawy, Leana Roe, MD   6 mg at 12/17/22 2143   ondansetron (ZOFRAN) tablet 4 mg  4 mg Oral Q6H PRN Elgergawy, Leana Roe, MD       Or   ondansetron (ZOFRAN) injection 4 mg  4 mg Intravenous Q6H PRN Elgergawy, Leana Roe, MD       Oral care mouth rinse  15 mL Mouth Rinse PRN Osvaldo Shipper, MD       vancomycin (VANCOREADY) IVPB 750 mg/150 mL  750 mg Intravenous Q M,W,F-HD Earnie Larsson, RPH       Facility-Administered Medications Ordered in Other Encounters  Medication Dose Route Frequency Provider Last Rate Last Admin   lanreotide acetate (SOMATULINE DEPOT) 120 MG/0.5ML injection            lanreotide acetate (SOMATULINE DEPOT) 120 MG/0.5ML injection            lanreotide acetate (SOMATULINE DEPOT) 120 MG/0.5ML injection            lanreotide acetate (SOMATULINE DEPOT) 120 MG/0.5ML injection            octreotide (SANDOSTATIN LAR) 30 MG IM injection            octreotide (SANDOSTATIN LAR) 30 MG IM injection              Discharge Medications: Please see discharge summary for a list of discharge medications.  Relevant Imaging  Results:  Relevant Lab Results:   Additional Information    Karn Cassis, LCSW

## 2022-12-18 NOTE — Progress Notes (Signed)
   12/18/22 0200  Provider Notification  Provider Name/Title MD Zierle-Ghosh  Date Provider Notified 12/18/22  Time Provider Notified 0200  Method of Notification  (Secure chat)  Notification Reason Critical Result  Test performed and critical result Blood cultures + for Staphylococcus Species  Date Critical Result Received 12/18/22  Time Critical Result Received 0200  Provider response No new orders  Date of Provider Response 12/18/22  Time of Provider Response 0200

## 2022-12-18 NOTE — Progress Notes (Signed)
Echo attempted at 0910. Pt care is in progress. Will attempt again as schedule permits.

## 2022-12-18 NOTE — Progress Notes (Signed)
[facility]  Catheter removed. Cxr without ptx and full catheter removed. No hematoma.  Carly Greenhouse, MD [facility] [address] [place], [place] [address] [phone] (office)

## 2022-12-18 NOTE — TOC Progression Note (Signed)
Transition of Care [facility]) - Progression Note    Patient Details  Name: Carly Wood MRN: [id number] Date of Birth: 11-16-1935  Transition of Care [facility]) CM/SW Contact  Karn Cassis, [place] Phone Number: 12/18/2022, 10:15 AM  Clinical Narrative: Per MD, anticipate d/c Monday. Kerri at [facility] updated. CMA starting SNF authorization. FL2 completed. TOC will follow.       Expected Discharge Plan: Skilled Nursing Facility Barriers to Discharge: Continued Medical Work up  Expected Discharge Plan and Services                                               Social Determinants of Health (SDOH) Interventions SDOH Screenings   Food Insecurity: No Food Insecurity (12/16/2022)  Housing: Low Risk  (12/16/2022)  Transportation Needs: No Transportation Needs (12/16/2022)  Utilities: Not At Risk (12/16/2022)  Alcohol Screen: Low Risk  (01/09/2020)  Depression (PHQ2-9): Low Risk  (03/13/2022)  Financial Resource Strain: Low Risk  (01/09/2020)  Physical Activity: Inactive (01/09/2020)  Social Connections: Moderately Isolated (01/09/2020)  Stress: No Stress Concern Present (01/09/2020)  Tobacco Use: Low Risk  (11/24/2022)    Readmission Risk Interventions    10/03/2021   12:36 PM 07/25/2020    2:09 PM 07/23/2020   10:59 AM  Readmission Risk Prevention Plan  Transportation Screening Complete Complete Complete  PCP or Specialist Appt within 3-5 Days Complete    HRI or Home Care Consult Complete    Social Work Consult for Recovery Care Planning/Counseling Complete    Palliative Care Screening Complete    Medication Review Oceanographer) Complete  Complete  HRI or Home Care Consult   Complete  Palliative Care Screening   Not Applicable  Skilled Nursing Facility   Complete

## 2022-12-18 NOTE — Progress Notes (Signed)
Echocardiogram 2D Echocardiogram has been performed.  Lucendia Herrlich 12/18/2022, 4:41 PM

## 2022-12-18 NOTE — Progress Notes (Signed)
PROGRESS NOTE   Carly Wood  YQM:[id number] DOB: [birth year] DOA: 12/16/2022 PCP: Sharee Holster, NP   Chief Complaint  Patient presents with   Altered Mental Status   Level of care: Telemetry  Brief Admission History:  87 y.o. female,  with medical history significant for ESRD, hypertension, diabetes mellitus, dementia, multiple myeloma.  Patient presents to ED secondary to complaints of fever, chills, altered mental status at HD center, patient reports her arm feels heavy, she reports some headache, overall she is a poor historian, she denies any chest pain, shortness of breath, does not make urine, otherwise she cannot give any specific complaints.  Patient with known history of multiple myeloma, she is on Velcade infusion biweekly. -In ED patient was noted to be febrile 101.4, tachypneic, tachycardic, chest x-ray with no acute findings, anuric unable to obtain any urine by in and out,Creatinine at 4.8, BUN at 22, she is having left upper chest HD catheter, blood cultures were sent, she was started on broad-spectrum antibiotics and [facility] hospitalist consulted to admit.   Assessment and Plan: Gram positive Cocci Bacteremia - suspect from infected dialysis catheter - discussed with Dr. Marisue Humble, plan is to remove catheter after HD today - Dr. Henreitta Leber consulted to remove catheter - nephrology formulating a plan for new cath to be placed on Monday 10/14 - continue IV antibiotics - 2D echocardiogram pending - ID telemedicine consult when more culture data available  ESRD on HD - HD today on schedule per nephrology team - HD cath removed by Dr. Henreitta Leber on 12/18/22  History of MM - treated by Dr. Ellin Saba outpatient   History of breast CA  - stable on anastrozole  Macrocytic Anemia  - B12, folic acid within normal limits    DVT prophylaxis: SCD Code Status: DNR  Family Communication:  Disposition: anticipate return to prior senior living    Consultants:   nephrology Procedures:  Hemodialysis 10/11 HD catheter removal 10/11  Antimicrobials:  Cefepime/vancomycin 10/1   Subjective: Pt denies any specific complaints, says she feels no fever or chills.    Objective: Vitals:   12/18/22 1400 12/18/22 1415 12/18/22 1420 12/18/22 1430  BP: (!) 153/79 (!) 149/76  (!) 148/71  Pulse:    78  Resp: 20 20 19 18   Temp:    98.1 F (36.7 C)  TempSrc:    Oral  SpO2:    100%  Weight:    63 kg  Height:        Intake/Output Summary (Last 24 hours) at 12/18/2022 1515 Last data filed at 12/18/2022 1430 Gross per 24 hour  Intake 600 ml  Output 1500 ml  Net -900 ml   Filed Weights   12/16/22 2259 12/18/22 1035 12/18/22 1430  Weight: 62.1 kg 64.4 kg 63 kg   Examination:  General exam: chronically ill appearing female, awake, alert, cooperative, Appears calm and comfortable  Respiratory system:  Respiratory effort normal. Cardiovascular system: normal S1 & S2 heard. No JVD, murmurs, rubs, gallops or clicks. No pedal edema. Gastrointestinal system: Abdomen is nondistended, soft and nontender. No organomegaly or masses felt. Normal bowel sounds heard. Central nervous system: Alert and oriented but seems confused at times. No focal neurological deficits. Extremities: Symmetric 5 x 5 power. Skin: No rashes, lesions or ulcers. Psychiatry: Judgement and insight UTD.  Mood & affect appropriate.   Data Reviewed: I have personally reviewed following labs and imaging studies  CBC: Recent Labs  Lab 12/16/22 1807 12/17/22 0434 12/17/22 0530 12/18/22 0429  WBC 6.7 4.5  --  4.2  NEUTROABS 5.9  --  3.1 2.3  HGB 12.4 10.4*  --  10.0*  HCT 38.3 32.3*  --  30.4*  MCV 101.3* 101.9*  --  101.3*  PLT 201 129*  --  150    Basic Metabolic Panel: Recent Labs  Lab 12/16/22 1807 12/17/22 0434 12/18/22 1118  NA 133* 133* 131*  K 4.1 4.6 4.9  CL 93* 95* 92*  CO2 24 24 24   GLUCOSE 123* 72 149*  BUN 22 28* 60*  CREATININE 4.81* 6.00* 8.82*   CALCIUM 9.4 9.4 8.5*  PHOS  --   --  7.9*    CBG: Recent Labs  Lab 12/16/22 1946 12/17/22 0732 12/17/22 1104 12/17/22 1620 12/17/22 2127  GLUCAP 169* 89 105* 82 116*    Recent Results (from the past 240 hour(s))  Blood Culture (routine x 2)     Status: None (Preliminary result)   Collection Time: 12/16/22  6:07 PM   Specimen: BLOOD  Result Value Ref Range Status   Specimen Description BLOOD BLOOD RIGHT ARM  Final   Special Requests   Final    Blood Culture results may not be optimal due to an inadequate volume of blood received in culture bottles BOTTLES DRAWN AEROBIC AND ANAEROBIC   Culture   Final    NO GROWTH 2 DAYS Performed at [facility], [address], [place], [place] [address]    Report Status PENDING  Incomplete  Blood Culture (routine x 2)     Status: None (Preliminary result)   Collection Time: 12/16/22  7:03 PM   Specimen: BLOOD  Result Value Ref Range Status   Specimen Description   Final    BLOOD BLOOD RIGHT ARM Performed at [facility], [address], [place], [place] [address]    Special Requests   Final    BOTTLES DRAWN AEROBIC AND ANAEROBIC Blood Culture results may not be optimal due to an excessive volume of blood received in culture bottles Performed at [facility], [address], [place], [place] [address]    Culture  Setup Time   Final    GRAM POSITIVE COCCI AEROBIC BOTTLE ONLY CRITICAL RESULT CALLED TO, READ BACK BY AND VERIFIED WITH: ROBERTSON,B AT 1613 ON 12/17/22 BY PURIE,J CRITICAL RESULT CALLED TO, READ BACK BY AND VERIFIED WITH: S HARDIN,RN@0203  12/18/22 MK    Culture   Final    GRAM POSITIVE COCCI TOO YOUNG TO READ Performed at [facility], [address], [place], [place] [address]    Report Status PENDING  Incomplete  Blood Culture ID Panel (Reflexed)     Status: Abnormal   Collection Time: 12/16/22  7:03 PM  Result Value Ref Range Status   Enterococcus faecalis NOT DETECTED NOT DETECTED Final   Enterococcus Faecium  NOT DETECTED NOT DETECTED Final   Listeria monocytogenes NOT DETECTED NOT DETECTED Final   Staphylococcus species DETECTED (A) NOT DETECTED Final    Comment: CRITICAL RESULT CALLED TO, READ BACK BY AND VERIFIED WITH: S HARDIN,RN@0204  12/18/22 MK    Staphylococcus aureus (BCID) NOT DETECTED NOT DETECTED Final   Staphylococcus epidermidis NOT DETECTED NOT DETECTED Final   Staphylococcus lugdunensis NOT DETECTED NOT DETECTED Final   Streptococcus species NOT DETECTED NOT DETECTED Final   Streptococcus agalactiae NOT DETECTED NOT DETECTED Final   Streptococcus pneumoniae NOT DETECTED NOT DETECTED Final   Streptococcus pyogenes NOT DETECTED NOT DETECTED Final   A.calcoaceticus-baumannii NOT DETECTED NOT DETECTED Final  Bacteroides fragilis NOT DETECTED NOT DETECTED Final   Enterobacterales NOT DETECTED NOT DETECTED Final   Enterobacter cloacae complex NOT DETECTED NOT DETECTED Final   Escherichia coli NOT DETECTED NOT DETECTED Final   Klebsiella aerogenes NOT DETECTED NOT DETECTED Final   Klebsiella oxytoca NOT DETECTED NOT DETECTED Final   Klebsiella pneumoniae NOT DETECTED NOT DETECTED Final   Proteus species NOT DETECTED NOT DETECTED Final   Salmonella species NOT DETECTED NOT DETECTED Final   Serratia marcescens NOT DETECTED NOT DETECTED Final   Haemophilus influenzae NOT DETECTED NOT DETECTED Final   Neisseria meningitidis NOT DETECTED NOT DETECTED Final   Pseudomonas aeruginosa NOT DETECTED NOT DETECTED Final   Stenotrophomonas maltophilia NOT DETECTED NOT DETECTED Final   Candida albicans NOT DETECTED NOT DETECTED Final   Candida auris NOT DETECTED NOT DETECTED Final   Candida glabrata NOT DETECTED NOT DETECTED Final   Candida krusei NOT DETECTED NOT DETECTED Final   Candida parapsilosis NOT DETECTED NOT DETECTED Final   Candida tropicalis NOT DETECTED NOT DETECTED Final   Cryptococcus neoformans/gattii NOT DETECTED NOT DETECTED Final    Comment: Performed at [facility], [address], [place], [place] [address]  Resp panel by RT-PCR (RSV, Flu A&B, Covid) Anterior Nasal Swab     Status: None   Collection Time: 12/16/22  7:42 PM   Specimen: Anterior Nasal Swab  Result Value Ref Range Status   SARS Coronavirus 2 by RT PCR NEGATIVE NEGATIVE Final    Comment: (NOTE) SARS-CoV-2 target nucleic acids are NOT DETECTED.  The SARS-CoV-2 RNA is generally detectable in upper respiratory specimens during the acute phase of infection. The lowest concentration of SARS-CoV-2 viral copies this assay can detect is 138 copies/mL. A negative result does not preclude SARS-Cov-2 infection and should not be used as the sole basis for treatment or other patient management decisions. A negative result may occur with  improper specimen collection/handling, submission of specimen other than nasopharyngeal swab, presence of viral mutation(s) within the areas targeted by this assay, and inadequate number of viral copies(<138 copies/mL). A negative result must be combined with clinical observations, patient history, and epidemiological information. The expected result is Negative.  Fact Sheet for Patients:  BloggerCourse.com  Fact Sheet for Healthcare Providers:  SeriousBroker.it  This test is no t yet approved or cleared by the [place] FDA and  has been authorized for detection and/or diagnosis of SARS-CoV-2 by FDA under an Emergency Use Authorization (EUA). This EUA will remain  in effect (meaning this test can be used) for the duration of the COVID-19 declaration under Section 564(b)(1) of the Act, 21 U.S.C.section 360bbb-3(b)(1), unless the authorization is terminated  or revoked sooner.       Influenza A by PCR NEGATIVE NEGATIVE Final   Influenza B by PCR NEGATIVE NEGATIVE Final    Comment: (NOTE) The Xpert Xpress SARS-CoV-2/FLU/RSV plus assay is intended as an aid in the diagnosis of influenza from  Nasopharyngeal swab specimens and should not be used as a sole basis for treatment. Nasal washings and aspirates are unacceptable for Xpert Xpress SARS-CoV-2/FLU/RSV testing.  Fact Sheet for Patients: BloggerCourse.com  Fact Sheet for Healthcare Providers: SeriousBroker.it  This test is not yet approved or cleared by the [place] FDA and has been authorized for detection and/or diagnosis of SARS-CoV-2 by FDA under an Emergency Use Authorization (EUA). This EUA will remain in effect (meaning this test can be used) for the duration of the COVID-19 declaration under Section 564(b)(1) of  the Act, 21 U.S.C. section 360bbb-3(b)(1), unless the authorization is terminated or revoked.     Resp Syncytial Virus by PCR NEGATIVE NEGATIVE Final    Comment: (NOTE) Fact Sheet for Patients: BloggerCourse.com  Fact Sheet for Healthcare Providers: SeriousBroker.it  This test is not yet approved or cleared by the [place] FDA and has been authorized for detection and/or diagnosis of SARS-CoV-2 by FDA under an Emergency Use Authorization (EUA). This EUA will remain in effect (meaning this test can be used) for the duration of the COVID-19 declaration under Section 564(b)(1) of the Act, 21 U.S.C. section 360bbb-3(b)(1), unless the authorization is terminated or revoked.  Performed at [facility], [address], [place], [place] [address]   Culture, blood (Routine X 2) w Reflex to ID Panel     Status: None (Preliminary result)   Collection Time: 12/17/22  7:24 PM   Specimen: BLOOD  Result Value Ref Range Status   Specimen Description BLOOD LEFT ANTECUBITAL  Final   Special Requests   Final    BOTTLES DRAWN AEROBIC AND ANAEROBIC Blood Culture results may not be optimal due to an excessive volume of blood received in culture bottles   Culture   Final    NO GROWTH < 12 HOURS Performed at  [facility], [address], [place], [place] [address]    Report Status PENDING  Incomplete  Culture, blood (Routine X 2) w Reflex to ID Panel     Status: None (Preliminary result)   Collection Time: 12/17/22  7:26 PM   Specimen: BLOOD  Result Value Ref Range Status   Specimen Description BLOOD BLOOD LEFT HAND  Final   Special Requests   Final    BOTTLES DRAWN AEROBIC ONLY Blood Culture results may not be optimal due to an excessive volume of blood received in culture bottles   Culture   Final    NO GROWTH < 12 HOURS Performed at [facility], [address], [place], [place] [address]    Report Status PENDING  Incomplete     Radiology Studies: DG Chest Port 1 View  Result Date: 12/16/2022 CLINICAL DATA:  Cough, altered mental status. EXAM: PORTABLE CHEST 1 VIEW COMPARISON:  10/01/2021. FINDINGS: The heart is enlarged and the mediastinal contour is within normal limits. Atherosclerotic calcification of the aorta is noted. Lung volumes are low. No consolidation, effusion, or pneumothorax. A left internal jugular central venous catheter appear stable. Surgical clips are present in the right axilla. No acute osseous abnormality. IMPRESSION: No active disease. Electronically Signed   By: Thornell Sartorius M.D.   On: 12/16/2022 20:29    Scheduled Meds:  acyclovir  200 mg Oral q AM   anastrozole  1 mg Oral Daily   Chlorhexidine Gluconate Cloth  6 each Topical Daily   lidocaine  20 mL Intradermal Once   melatonin  6 mg Oral QHS   Continuous Infusions:  ceFEPime (MAXIPIME) IV 2 g (12/18/22 1420)   vancomycin 750 mg (12/18/22 1316)     LOS: 2 days   Time spent: 46 mins  Abagale Boulos Laural Benes, MD How to contact the [facility] Attending or Consulting provider 7A - 7P or covering provider during after hours 7P -7A, for this patient?  Check the care team in [facility] and look for a) attending/consulting [facility] provider listed and b) the [facility] team listed Log into [email or web address] to find provider on call.  Locate the  [facility] provider you are looking for under [facility] and page to a number that  you can be directly reached. If you still have difficulty reaching the provider, please page the [facility] (Director on Call) for the Hospitalists listed on amion for assistance.  12/18/2022, 3:15 PM

## 2022-12-18 NOTE — Procedures (Signed)
[facility] Procedure Note  12/18/22  Pre-procedure Diagnosis: ESRD, bacteremia, tunneled line in place    Post-procedure Diagnosis: Same   Procedure(s) Performed: Removal of tunneled dialysis catheter    Surgeon: Leatrice Jewels. Henreitta Leber, MD   Assistants: No qualified resident was available    Anesthesia: Lidocaine 1%    Specimens: None    Estimated Blood Loss: Minimal  Wound Class: Dirty/ Infected    Procedure Indications: Ms. Carly Wood is a 87 yo who has bacteremia in the setting of a tunneled dialysis catheter. I was asked to remove this after dialysis. We discussed risk of bleeding, infection, incomplete removal, need for future access.   Findings: Fully removed catheter    Procedure: The patient was taken to the procedure room and placed supine. The the left neck, chest, and catheter were prepared and draped in the usual sterile fashion. Lidocaine 1% was injected around the catheter. Using sharp excision I was able to get around the catheter and the cuff, and from here the catheter came loose. The catheter was removed in its entirety. My assistant held pressure for 15 minutes. A gauze dressing and tapep was placed.   CXR was obtained.   Final inspection revealed acceptable hemostasis. The patient tolerated the procedure well.    Algis Greenhouse, MD [facility] [address] [place], [place] [address] [phone] (office)

## 2022-12-18 NOTE — Plan of Care (Signed)

## 2022-12-18 NOTE — Consult Note (Addendum)
[facility] Consult  Reason for Consult: ESRD, bacteremia  Referring Physician:  Dr. Marisue Humble   Chief Complaint   Altered Mental Status     HPI: SHARLA TANKARD is a 87 y.o. female with ESRD and a left internal jugular tunneled catheter. She has bacteremia and needs her line out. She will need a replacement line. I have reviewed her chart and see documentation of a left tunneled placed in 2021 but it looks like this was removed and a right sided catheter placed at some time. I am not sure when this  one was placed and she cannot give me an specifics about the catheter.   The catheter itself does not look very old.   Past Medical History:  Diagnosis Date   Anemia     chronic macrocytic anemia   Anxiety    Chronic kidney disease    Chronic renal disease, stage 4, severely decreased glomerular filtration rate (GFR) between 15-29 mL/min/1.73 square meter (HCC) 08/22/2015   Complication of anesthesia    delirious after Breast Surgery   Dementia (HCC)    mild   Depression    Diabetes mellitus with ESRD (end-stage renal disease) (HCC)    type II   Dysphagia    Dyspnea    with activity   GERD (gastroesophageal reflux disease)    Glaucoma    Hyperlipidemia    Hypertension    Multiple myeloma (HCC)    Pneumonia    Stage 1 infiltrating ductal carcinoma of right female breast (HCC) 08/21/2015   ER+ PR+ HER 2 neu + (3+) T1cN0     Past Surgical History:  Procedure Laterality Date   A/V FISTULAGRAM Right 07/05/2020   Procedure: A/V Fistulagram;  Surgeon: Sherren Kerns, MD;  [facility];  Service: Cardiovascular;  Laterality: Right;   AV FISTULA PLACEMENT Left 11/22/2017   Procedure: ARTERIOVENOUS (AV) FISTULA CREATION LEFT ARM;  Surgeon: Sherren Kerns, MD;  Location: [facility] OR;  Service: Vascular;  Laterality: Left;   AV FISTULA PLACEMENT Right 04/04/2020   Procedure: RIGHT ARM ARTERIOVENOUS FISTULA CREATION;  Surgeon: Larina Earthly, MD;   Location: [facility]S;  Service: Vascular;  Laterality: Right;   AV FISTULA PLACEMENT Right 08/20/2020   Procedure: ARTERIOVENOUS (AV) FISTULA LIGATION RIGHT ARM;  Surgeon: Sherren Kerns, MD;  Location: [facility] OR;  Service: Vascular;  Laterality: Right;   BIOPSY  08/07/2016   Procedure: BIOPSY;  Surgeon: Corbin Ade, MD;  [facility];  Service: Endoscopy;;  gastric ulcer biopsy   COLONOSCOPY     ESOPHAGOGASTRODUODENOSCOPY N/A 08/07/2016   LA Grade A esophagitis s/p dilation, small hiatal hernia, multiple gastric ulcers and erosions, duodenal erosions s/p biopsy. Negative H.pylori    ESOPHAGOGASTRODUODENOSCOPY N/A 11/27/2016   normal esophagus, previously noted gastric ulcers completely healed, normal duodenum.    ESOPHAGOGASTRODUODENOSCOPY (EGD) WITH PROPOFOL N/A 07/23/2020   Procedure: ESOPHAGOGASTRODUODENOSCOPY (EGD) WITH PROPOFOL;  Surgeon: Malissa Hippo, MD;  [facility];  Service: Endoscopy;  Laterality: N/A;   FISTULA SUPERFICIALIZATION Left 02/14/2018   Procedure: FISTULA SUPERFICIALIZATION LEFT ARM;  Surgeon: Chuck Hint, MD;  Location: [facility] OR;  Service: Vascular;  Laterality: Left;   FRACTURE SURGERY Right    ankle   HOT HEMOSTASIS  07/23/2020   Procedure: HOT HEMOSTASIS (ARGON PLASMA COAGULATION/BICAP);  Surgeon: Malissa Hippo, MD;  [facility];  Service: Endoscopy;;   INTRAMEDULLARY (IM) NAIL INTERTROCHANTERIC Right 07/12/2020   Procedure: INTRAMEDULLARY (IM) NAIL INTERTROCHANTRIC;  Surgeon: Oliver Barre, MD;  Location: [facility];  Service: Orthopedics;  Laterality: Right;   MALONEY DILATION N/A 08/07/2016   Procedure: Elease Hashimoto DILATION;  Surgeon: Corbin Ade, MD;  [facility];  Service: Endoscopy;  Laterality: N/A;   MASTECTOMY, PARTIAL Right    PERIPHERAL VASCULAR BALLOON ANGIOPLASTY Left 07/13/2019   Procedure: PERIPHERAL VASCULAR BALLOON ANGIOPLASTY;  Surgeon: Cephus Shelling, MD;  [facility];  Service: Cardiovascular;  Laterality: Left;  arm fistulogram   PERIPHERAL VASCULAR BALLOON ANGIOPLASTY Right 05/22/2020   Procedure: PERIPHERAL VASCULAR BALLOON ANGIOPLASTY;  Surgeon: Leonie Douglas, MD;  [facility];  Service: Cardiovascular;  Laterality: Right;  arm fistula   PERIPHERAL VASCULAR BALLOON ANGIOPLASTY Right 07/05/2020   Procedure: PERIPHERAL VASCULAR BALLOON ANGIOPLASTY;  Surgeon: Sherren Kerns, MD;  [facility];  Service: Cardiovascular;  Laterality: Right;  arm fistula   PORT-A-CATH REMOVAL Left 11/22/2017   Procedure: REMOVAL PORT-A-CATH LEFT CHEST;  Surgeon: Sherren Kerns, MD;  Location: [facility] OR;  Service: Vascular;  Laterality: Left;   RETINAL DETACHMENT SURGERY Right    SCLEROTHERAPY  07/23/2020   Procedure: SCLEROTHERAPY;  Surgeon: Malissa Hippo, MD;  [facility];  Service: Endoscopy;;   UPPER EXTREMITY VENOGRAPHY N/A 08/22/2020   Procedure: LEFT UPPER & CENTRAL VENOGRAPHY;  Surgeon: Cephus Shelling, MD;  [facility];  Service: Cardiovascular;  Laterality: N/A;    Family History  Problem Relation Age of Onset   Multiple myeloma Sister    Brain cancer Sister    Dementia Mother        died at 87   Stroke Mother    Heart failure Mother    Diabetes Mother    Heart disease Father    Prostate cancer Brother    Colon cancer Neg Hx     Social History   Tobacco Use   Smoking status: Never   Smokeless tobacco: Never  Vaping Use   Vaping status: Never Used  Substance Use Topics   Alcohol use: No    Alcohol/week: 0.0 standard drinks of alcohol   Drug use: No    Medications: I have reviewed the patient's current medications. Prior to Admission:  Medications Prior to Admission  Medication Sig Dispense Refill Last Dose   acetaminophen (TYLENOL) 325 MG tablet Take 650 mg by mouth 3 (three) times daily.   12/16/2022   acyclovir (ZOVIRAX) 200 MG capsule Take 200 mg by mouth in the  morning. (0800)   12/16/2022   anastrozole (ARIMIDEX) 1 MG tablet TAKE 1 TABLET BY MOUTH DAILY 30 tablet 6 12/16/2022   Calcium Carb-Cholecalciferol (CALCIUM + VITAMIN D3) 500-5 MG-MCG TABS Take 1 tablet by mouth daily.   12/16/2022   melatonin 5 MG TABS Take 5 mg by mouth at bedtime.   Past Week   multivitamin (RENA-VIT) TABS tablet Take 1 tablet by mouth at bedtime.   Past Week   Nutritional Supplements (ENSURE ENLIVE PO) Take 237 mLs by mouth every evening.   Past Week   ondansetron (ZOFRAN-ODT) 4 MG disintegrating tablet Take 4 mg by mouth daily before breakfast.   12/16/2022   pantoprazole (PROTONIX) 40 MG tablet Take 40 mg by mouth 2 (two) times daily.   12/16/2022   sennosides-docusate sodium (SENOKOT-S) 8.6-50 MG tablet Take 1 tablet by mouth 2 (two) times daily.   12/16/2022   sertraline (ZOLOFT) 25 MG tablet Take 25 mg by mouth daily.  12/16/2022   sucralfate (CARAFATE) 1 GM/10ML suspension Take 1 g by mouth in the morning, at noon, in the evening, and at bedtime.   12/16/2022   Scheduled:  acyclovir  200 mg Oral q AM   anastrozole  1 mg Oral Daily   Chlorhexidine Gluconate Cloth  6 each Topical Daily   melatonin  6 mg Oral QHS   Continuous:  ceFEPime (MAXIPIME) IV     vancomycin 750 mg (12/18/22 1316)   JXB:[id number] **OR** acetaminophen, albuterol, hydrALAZINE, ondansetron **OR** ondansetron (ZOFRAN) IV, mouth rinse  Allergies  Allergen Reactions   Ace Inhibitors Other (See Comments) and Cough    Tongue swelling Angioedema    Angiotensin Receptor Blockers Other (See Comments)    Angioedema with ACE-I   Penicillins Other (See Comments)    Unknown reaction     ROS:  A comprehensive review of systems was negative except for: Constitutional: positive for chills and fevers Hematologic/lymphatic: positive for bacteremia  Blood pressure (!) 142/70, pulse 65, temperature 98.1 F (36.7 C), temperature source Oral, resp. rate 19, height 5\' 3"  (1.6 m), weight 64.4 kg, SpO2  100%. Physical Exam Vitals reviewed.  HENT:     Head: Normocephalic.     Nose: Nose normal.  Eyes:     Extraocular Movements: Extraocular movements intact.  Cardiovascular:     Rate and Rhythm: Normal rate.  Pulmonary:     Effort: Pulmonary effort is normal.  Chest:     Comments: Left chest with tunneled catheter Abdominal:     General: There is no distension.     Palpations: Abdomen is soft.  Skin:    General: Skin is warm.  Neurological:     General: No focal deficit present.     Mental Status: She is alert. Mental status is at baseline.  Psychiatric:        Mood and Affect: Mood normal.     Results: Results for orders placed or performed during the hospital encounter of 12/16/22 (from the past 48 hour(s))  Comprehensive metabolic panel     Status: Abnormal   Collection Time: 12/16/22  6:07 PM  Result Value Ref Range   Sodium 133 (L) 135 - 145 mmol/L   Potassium 4.1 3.5 - 5.1 mmol/L   Chloride 93 (L) 98 - 111 mmol/L   CO2 24 22 - 32 mmol/L   Glucose, Bld 123 (H) 70 - 99 mg/dL    Comment: Glucose reference range applies only to samples taken after fasting for at least 8 hours.   BUN 22 8 - 23 mg/dL   Creatinine, Ser 6.57 (H) 0.44 - 1.00 mg/dL   Calcium 9.4 8.9 - 84.6 mg/dL   Total Protein 7.9 6.5 - 8.1 g/dL   Albumin 4.1 3.5 - 5.0 g/dL   AST 18 15 - 41 U/L   ALT 14 0 - 44 U/L   Alkaline Phosphatase 80 38 - 126 U/L   Total Bilirubin 0.6 0.3 - 1.2 mg/dL   GFR, Estimated 8 (L) >60 mL/min    Comment: (NOTE) Calculated using the CKD-EPI Creatinine Equation (2021)    Anion gap 16 (H) 5 - 15    Comment: Performed at [facility], [address] [place], [place] [address]  Lipase, blood     Status: Abnormal   Collection Time: 12/16/22  6:07 PM  Result Value Ref Range   Lipase 82 (H) 11 - 51 U/L    Comment: Performed at [facility], [address], [place], [place] [address]  Troponin I (High Sensitivity)     Status: Abnormal   Collection Time: 12/16/22  6:07 PM   Result Value Ref Range   Troponin I (High Sensitivity) 58 (H) <18 ng/L    Comment: (NOTE) Elevated high sensitivity troponin I (hsTnI) values and significant  changes across serial measurements may suggest ACS but many other  chronic and acute conditions are known to elevate hsTnI results.  Refer to the "Links" section for chest pain algorithms and additional  guidance. Performed at [facility], [address] [place], [place] [address]   CBC with Differential     Status: Abnormal   Collection Time: 12/16/22  6:07 PM  Result Value Ref Range   WBC 6.7 4.0 - 10.5 K/uL   RBC 3.78 (L) 3.87 - 5.11 MIL/uL   Hemoglobin 12.4 12.0 - 15.0 g/dL   HCT 60.4 54.0 - 98.1 %   MCV 101.3 (H) 80.0 - 100.0 fL   MCH 32.8 26.0 - 34.0 pg   MCHC 32.4 30.0 - 36.0 g/dL   RDW 19.1 (H) 47.8 - 29.5 %   Platelets 201 150 - 400 K/uL   nRBC 0.0 0.0 - 0.2 %   Neutrophils Relative % 88 %   Neutro Abs 5.9 1.7 - 7.7 K/uL   Lymphocytes Relative 5 %   Lymphs Abs 0.3 (L) 0.7 - 4.0 K/uL   Monocytes Relative 6 %   Monocytes Absolute 0.4 0.1 - 1.0 K/uL   Eosinophils Relative 1 %   Eosinophils Absolute 0.0 0.0 - 0.5 K/uL   Basophils Relative 0 %   Basophils Absolute 0.0 0.0 - 0.1 K/uL   Immature Granulocytes 0 %   Abs Immature Granulocytes 0.02 0.00 - 0.07 K/uL    Comment: Performed at [facility], [address], [place], [place] [address]  Lactic acid, plasma     Status: None   Collection Time: 12/16/22  6:07 PM  Result Value Ref Range   Lactic Acid, Venous 1.4 0.5 - 1.9 mmol/L    Comment: Performed at [facility], [address], [place], [place] [address]  Protime-INR     Status: None   Collection Time: 12/16/22  6:07 PM  Result Value Ref Range   Prothrombin Time 14.1 11.4 - 15.2 seconds   INR 1.1 0.8 - 1.2    Comment: (NOTE) INR goal varies based on device and disease states. Performed at [facility], [address] [place], [place] [address]   APTT     Status: None   Collection Time: 12/16/22  6:07  PM  Result Value Ref Range   aPTT 29 24 - 36 seconds    Comment: Performed at [facility], [address] [place], [place] [address]  Blood Culture (routine x 2)     Status: None (Preliminary result)   Collection Time: 12/16/22  6:07 PM   Specimen: BLOOD  Result Value Ref Range   Specimen Description BLOOD BLOOD RIGHT ARM    Special Requests      Blood Culture results may not be optimal due to an inadequate volume of blood received in culture bottles BOTTLES DRAWN AEROBIC AND ANAEROBIC   Culture      NO GROWTH 2 DAYS Performed at [facility], [address] [place], [place] [address]    Report Status PENDING   Blood Culture (routine x 2)     Status: None (Preliminary result)   Collection Time: 12/16/22  7:03 PM   Specimen: BLOOD  Result Value Ref Range  Specimen Description      BLOOD BLOOD RIGHT ARM Performed at [facility], [address], [place], [place] [address]    Special Requests      BOTTLES DRAWN AEROBIC AND ANAEROBIC Blood Culture results may not be optimal due to an excessive volume of blood received in culture bottles Performed at [facility], [address], [place], [place] [address]    Culture  Setup Time      GRAM POSITIVE COCCI AEROBIC BOTTLE ONLY CRITICAL RESULT CALLED TO, READ BACK BY AND VERIFIED WITH: ROBERTSON,B AT 1613 ON 12/17/22 BY PURIE,J CRITICAL RESULT CALLED TO, READ BACK BY AND VERIFIED WITH: S HARDIN,RN@0203  12/18/22 MK    Culture      GRAM POSITIVE COCCI TOO YOUNG TO READ Performed at [facility], [address], [place], [place] [address]    Report Status PENDING   Blood Culture ID Panel (Reflexed)     Status: Abnormal   Collection Time: 12/16/22  7:03 PM  Result Value Ref Range   Enterococcus faecalis NOT DETECTED NOT DETECTED   Enterococcus Faecium NOT DETECTED NOT DETECTED   Listeria monocytogenes NOT DETECTED NOT DETECTED   Staphylococcus species DETECTED (A) NOT DETECTED    Comment: CRITICAL RESULT CALLED TO, READ BACK BY  AND VERIFIED WITH: S HARDIN,RN@0204  12/18/22 MK    Staphylococcus aureus (BCID) NOT DETECTED NOT DETECTED   Staphylococcus epidermidis NOT DETECTED NOT DETECTED   Staphylococcus lugdunensis NOT DETECTED NOT DETECTED   Streptococcus species NOT DETECTED NOT DETECTED   Streptococcus agalactiae NOT DETECTED NOT DETECTED   Streptococcus pneumoniae NOT DETECTED NOT DETECTED   Streptococcus pyogenes NOT DETECTED NOT DETECTED   A.calcoaceticus-baumannii NOT DETECTED NOT DETECTED   Bacteroides fragilis NOT DETECTED NOT DETECTED   Enterobacterales NOT DETECTED NOT DETECTED   Enterobacter cloacae complex NOT DETECTED NOT DETECTED   Escherichia coli NOT DETECTED NOT DETECTED   Klebsiella aerogenes NOT DETECTED NOT DETECTED   Klebsiella oxytoca NOT DETECTED NOT DETECTED   Klebsiella pneumoniae NOT DETECTED NOT DETECTED   Proteus species NOT DETECTED NOT DETECTED   Salmonella species NOT DETECTED NOT DETECTED   Serratia marcescens NOT DETECTED NOT DETECTED   Haemophilus influenzae NOT DETECTED NOT DETECTED   Neisseria meningitidis NOT DETECTED NOT DETECTED   Pseudomonas aeruginosa NOT DETECTED NOT DETECTED   Stenotrophomonas maltophilia NOT DETECTED NOT DETECTED   Candida albicans NOT DETECTED NOT DETECTED   Candida auris NOT DETECTED NOT DETECTED   Candida glabrata NOT DETECTED NOT DETECTED   Candida krusei NOT DETECTED NOT DETECTED   Candida parapsilosis NOT DETECTED NOT DETECTED   Candida tropicalis NOT DETECTED NOT DETECTED   Cryptococcus neoformans/gattii NOT DETECTED NOT DETECTED    Comment: Performed at [facility], [address], [place], [place] [address]  Resp panel by RT-PCR (RSV, Flu A&B, Covid) Anterior Nasal Swab     Status: None   Collection Time: 12/16/22  7:42 PM   Specimen: Anterior Nasal Swab  Result Value Ref Range   SARS Coronavirus 2 by RT PCR NEGATIVE NEGATIVE    Comment: (NOTE) SARS-CoV-2 target nucleic acids are NOT DETECTED.  The SARS-CoV-2 RNA is  generally detectable in upper respiratory specimens during the acute phase of infection. The lowest concentration of SARS-CoV-2 viral copies this assay can detect is 138 copies/mL. A negative result does not preclude SARS-Cov-2 infection and should not be used as the sole basis for treatment or other patient management decisions. A negative result may occur with  improper  specimen collection/handling, submission of specimen other than nasopharyngeal swab, presence of viral mutation(s) within the areas targeted by this assay, and inadequate number of viral copies(<138 copies/mL). A negative result must be combined with clinical observations, patient history, and epidemiological information. The expected result is Negative.  Fact Sheet for Patients:  BloggerCourse.com  Fact Sheet for Healthcare Providers:  SeriousBroker.it  This test is no t yet approved or cleared by the [place] FDA and  has been authorized for detection and/or diagnosis of SARS-CoV-2 by FDA under an Emergency Use Authorization (EUA). This EUA will remain  in effect (meaning this test can be used) for the duration of the COVID-19 declaration under Section 564(b)(1) of the Act, 21 U.S.C.section 360bbb-3(b)(1), unless the authorization is terminated  or revoked sooner.       Influenza A by PCR NEGATIVE NEGATIVE   Influenza B by PCR NEGATIVE NEGATIVE    Comment: (NOTE) The Xpert Xpress SARS-CoV-2/FLU/RSV plus assay is intended as an aid in the diagnosis of influenza from Nasopharyngeal swab specimens and should not be used as a sole basis for treatment. Nasal washings and aspirates are unacceptable for Xpert Xpress SARS-CoV-2/FLU/RSV testing.  Fact Sheet for Patients: BloggerCourse.com  Fact Sheet for Healthcare Providers: SeriousBroker.it  This test is not yet approved or cleared by the [place] FDA  and has been authorized for detection and/or diagnosis of SARS-CoV-2 by FDA under an Emergency Use Authorization (EUA). This EUA will remain in effect (meaning this test can be used) for the duration of the COVID-19 declaration under Section 564(b)(1) of the Act, 21 U.S.C. section 360bbb-3(b)(1), unless the authorization is terminated or revoked.     Resp Syncytial Virus by PCR NEGATIVE NEGATIVE    Comment: (NOTE) Fact Sheet for Patients: BloggerCourse.com  Fact Sheet for Healthcare Providers: SeriousBroker.it  This test is not yet approved or cleared by the [place] FDA and has been authorized for detection and/or diagnosis of SARS-CoV-2 by FDA under an Emergency Use Authorization (EUA). This EUA will remain in effect (meaning this test can be used) for the duration of the COVID-19 declaration under Section 564(b)(1) of the Act, 21 U.S.C. section 360bbb-3(b)(1), unless the authorization is terminated or revoked.  Performed at [facility], [address] [place], [place] [address]   CBG monitoring, ED     Status: Abnormal   Collection Time: 12/16/22  7:46 PM  Result Value Ref Range   Glucose-Capillary 169 (H) 70 - 99 mg/dL    Comment: Glucose reference range applies only to samples taken after fasting for at least 8 hours.  Lactic acid, plasma     Status: None   Collection Time: 12/16/22  8:12 PM  Result Value Ref Range   Lactic Acid, Venous 1.1 0.5 - 1.9 mmol/L    Comment: Performed at [facility], [address] [place], [place] [address]  Troponin I (High Sensitivity)     Status: Abnormal   Collection Time: 12/16/22  8:12 PM  Result Value Ref Range   Troponin I (High Sensitivity) 54 (H) <18 ng/L    Comment: (NOTE) Elevated high sensitivity troponin I (hsTnI) values and significant  changes across serial measurements may suggest ACS but many other  chronic and acute conditions are known to elevate hsTnI results.   Refer to the "Links" section for chest pain algorithms and additional  guidance. Performed at [facility], [address] [place], [place] [address]   Basic metabolic panel     Status: Abnormal   Collection  Time: 12/17/22  4:34 AM  Result Value Ref Range   Sodium 133 (L) 135 - 145 mmol/L   Potassium 4.6 3.5 - 5.1 mmol/L   Chloride 95 (L) 98 - 111 mmol/L   CO2 24 22 - 32 mmol/L   Glucose, Bld 72 70 - 99 mg/dL    Comment: Glucose reference range applies only to samples taken after fasting for at least 8 hours.   BUN 28 (H) 8 - 23 mg/dL   Creatinine, Ser 1.61 (H) 0.44 - 1.00 mg/dL   Calcium 9.4 8.9 - 09.6 mg/dL   GFR, Estimated 6 (L) >60 mL/min    Comment: (NOTE) Calculated using the CKD-EPI Creatinine Equation (2021)    Anion gap 14 5 - 15    Comment: Performed at [facility], [address], [place], [place] [address]  CBC     Status: Abnormal   Collection Time: 12/17/22  4:34 AM  Result Value Ref Range   WBC 4.5 4.0 - 10.5 K/uL   RBC 3.17 (L) 3.87 - 5.11 MIL/uL   Hemoglobin 10.4 (L) 12.0 - 15.0 g/dL   HCT 98.1 (L) 19.1 - 47.8 %   MCV 101.9 (H) 80.0 - 100.0 fL   MCH 32.8 26.0 - 34.0 pg   MCHC 32.2 30.0 - 36.0 g/dL   RDW 29.5 (H) 62.1 - 30.8 %   Platelets 129 (L) 150 - 400 K/uL   nRBC 0.0 0.0 - 0.2 %    Comment: Performed at [facility], [address] [place], [place] [address]  Differential     Status: Abnormal   Collection Time: 12/17/22  5:30 AM  Result Value Ref Range   Neutrophils Relative % 70 %   Neutro Abs 3.1 1.7 - 7.7 K/uL   Lymphocytes Relative 12 %   Lymphs Abs 0.5 (L) 0.7 - 4.0 K/uL   Monocytes Relative 15 %   Monocytes Absolute 0.7 0.1 - 1.0 K/uL   Eosinophils Relative 1 %   Eosinophils Absolute 0.0 0.0 - 0.5 K/uL   Basophils Relative 1 %   Basophils Absolute 0.0 0.0 - 0.1 K/uL   Immature Granulocytes 1 %   Abs Immature Granulocytes 0.02 0.00 - 0.07 K/uL    Comment: Performed at [facility], [address], [place], [place] [address]  Glucose,  capillary     Status: None   Collection Time: 12/17/22  7:32 AM  Result Value Ref Range   Glucose-Capillary 89 70 - 99 mg/dL    Comment: Glucose reference range applies only to samples taken after fasting for at least 8 hours.  Glucose, capillary     Status: Abnormal   Collection Time: 12/17/22 11:04 AM  Result Value Ref Range   Glucose-Capillary 105 (H) 70 - 99 mg/dL    Comment: Glucose reference range applies only to samples taken after fasting for at least 8 hours.  Glucose, capillary     Status: None   Collection Time: 12/17/22  4:20 PM  Result Value Ref Range   Glucose-Capillary 82 70 - 99 mg/dL    Comment: Glucose reference range applies only to samples taken after fasting for at least 8 hours.  Culture, blood (Routine X 2) w Reflex to ID Panel     Status: None (Preliminary result)   Collection Time: 12/17/22  7:24 PM   Specimen: BLOOD  Result Value Ref Range   Specimen Description BLOOD LEFT ANTECUBITAL    Special Requests      BOTTLES DRAWN AEROBIC AND ANAEROBIC  Blood Culture results may not be optimal due to an excessive volume of blood received in culture bottles   Culture      NO GROWTH < 12 HOURS Performed at [facility], [address], [place], [place] [address]    Report Status PENDING   Culture, blood (Routine X 2) w Reflex to ID Panel     Status: None (Preliminary result)   Collection Time: 12/17/22  7:26 PM   Specimen: BLOOD  Result Value Ref Range   Specimen Description BLOOD BLOOD LEFT HAND    Special Requests      BOTTLES DRAWN AEROBIC ONLY Blood Culture results may not be optimal due to an excessive volume of blood received in culture bottles   Culture      NO GROWTH < 12 HOURS Performed at [facility], [address], [place], [place] [address]    Report Status PENDING   Glucose, capillary     Status: Abnormal   Collection Time: 12/17/22  9:27 PM  Result Value Ref Range   Glucose-Capillary 116 (H) 70 - 99 mg/dL    Comment: Glucose reference range  applies only to samples taken after fasting for at least 8 hours.  Hepatitis B surface antigen     Status: None   Collection Time: 12/18/22  4:29 AM  Result Value Ref Range   Hepatitis B Surface Ag NON REACTIVE NON REACTIVE    Comment: Performed at [facility], [address] [place], [place] [address]  CBC with Differential/Platelet     Status: Abnormal   Collection Time: 12/18/22  4:29 AM  Result Value Ref Range   WBC 4.2 4.0 - 10.5 K/uL   RBC 3.00 (L) 3.87 - 5.11 MIL/uL   Hemoglobin 10.0 (L) 12.0 - 15.0 g/dL   HCT 91.4 (L) 78.2 - 95.6 %   MCV 101.3 (H) 80.0 - 100.0 fL   MCH 33.3 26.0 - 34.0 pg   MCHC 32.9 30.0 - 36.0 g/dL   RDW 21.3 08.6 - 57.8 %   Platelets 150 150 - 400 K/uL   nRBC 0.0 0.0 - 0.2 %   Neutrophils Relative % 55 %   Neutro Abs 2.3 1.7 - 7.7 K/uL   Lymphocytes Relative 19 %   Lymphs Abs 0.8 0.7 - 4.0 K/uL   Monocytes Relative 20 %   Monocytes Absolute 0.8 0.1 - 1.0 K/uL   Eosinophils Relative 4 %   Eosinophils Absolute 0.2 0.0 - 0.5 K/uL   Basophils Relative 1 %   Basophils Absolute 0.0 0.0 - 0.1 K/uL   Immature Granulocytes 1 %   Abs Immature Granulocytes 0.02 0.00 - 0.07 K/uL    Comment: Performed at [facility], [address], [place], [place] [address]  Vitamin B12     Status: None   Collection Time: 12/18/22  4:29 AM  Result Value Ref Range   Vitamin B-12 462 180 - 914 pg/mL    Comment: (NOTE) This assay is not validated for testing neonatal or myeloproliferative syndrome specimens for Vitamin B12 levels. Performed at [facility], [address] [place], [place] [address]   Folate     Status: None   Collection Time: 12/18/22  4:29 AM  Result Value Ref Range   Folate 35.9 >5.9 ng/mL    Comment: RESULT CONFIRMED BY MANUAL DILUTION Performed at [facility], [address], [place], [place] [address]   Iron and TIBC     Status: Abnormal   Collection Time: 12/18/22  4:29 AM  Result Value Ref Range   Iron 40 28 - 170 ug/dL   TIBC 161 (L)  096 - 045 ug/dL   Saturation Ratios 36 (H) 10.4 - 31.8 %   UIBC 70 ug/dL    Comment: Performed at [facility], [address], [place], [place] [address]  Ferritin     Status: None   Collection Time: 12/18/22  4:29 AM  Result Value Ref Range   Ferritin 277 11 - 307 ng/mL    Comment: Performed at [facility], [address] [place], [place] [address]  Reticulocytes     Status: Abnormal   Collection Time: 12/18/22  4:29 AM  Result Value Ref Range   Retic Ct Pct 1.2 0.4 - 3.1 %   RBC. 3.07 (L) 3.87 - 5.11 MIL/uL   Retic Count, Absolute 37.5 19.0 - 186.0 K/uL   Immature Retic Fract 13.8 2.3 - 15.9 %    Comment: Performed at [facility], [address], [name], [place] [address]  Renal function panel     Status: Abnormal   Collection Time: 12/18/22 11:18 AM  Result Value Ref Range   Sodium 131 (L) 135 - 145 mmol/L   Potassium 4.9 3.5 - 5.1 mmol/L   Chloride 92 (L) 98 - 111 mmol/L   CO2 24 22 - 32 mmol/L   Glucose, Bld 149 (H) 70 - 99 mg/dL    Comment: Glucose reference range applies only to samples taken after fasting for at least 8 hours.   BUN 60 (H) 8 - 23 mg/dL   Creatinine, Ser 2.13 (H) 0.44 - 1.00 mg/dL   Calcium 8.5 (L) 8.9 - 10.3 mg/dL   Phosphorus 7.9 (H) 2.5 - 4.6 mg/dL   Albumin 3.2 (L) 3.5 - 5.0 g/dL   GFR, Estimated 4 (L) >60 mL/min    Comment: (NOTE) Calculated using the CKD-EPI Creatinine Equation (2021)    Anion gap 15 5 - 15    Comment: Performed at [facility], [address], [place], [place] [address]   *Note: Due to a large number of results and/or encounters for the requested time period, some results have not been displayed. A complete set of results can be found in Results Review.    DG Chest Port 1 View  Result Date: 12/16/2022 CLINICAL DATA:  Cough, altered mental status. EXAM: PORTABLE CHEST 1 VIEW COMPARISON:  10/01/2021. FINDINGS: The heart is enlarged and the mediastinal contour is within normal limits. Atherosclerotic calcification of the aorta  is noted. Lung volumes are low. No consolidation, effusion, or pneumothorax. A left internal jugular central venous catheter appear stable. Surgical clips are present in the right axilla. No acute osseous abnormality. IMPRESSION: No active disease. Electronically Signed   By: Thornell Sartorius M.D.   On: 12/16/2022 20:29     Assessment & Plan:  CAROLL WEINHEIMER is a 87 y.o. female with bacteremia and a tunneled line. She will need the line out. Discussed risk of bleeding, infection, incomplete removal, issues with removing, need for another catheter.  -Plan to remove the catheter after dialysis today   Vascular notes from Dr. Darrick Penna and Dr. Chestine Spore in the past have mentioned patient maybe needing a graft in the thigh. I am thinking she will need Vascular or IR to help with her access given her multiple access history and issues.   All questions were answered to the satisfaction of the patient and family.    Lucretia Roers 12/18/2022, 1:56 PM

## 2022-12-19 DIAGNOSIS — K219 Gastro-esophageal reflux disease without esophagitis: Secondary | ICD-10-CM | POA: Diagnosis not present

## 2022-12-19 DIAGNOSIS — T829XXA Unspecified complication of cardiac and vascular prosthetic device, implant and graft, initial encounter: Secondary | ICD-10-CM | POA: Diagnosis not present

## 2022-12-19 DIAGNOSIS — R7881 Bacteremia: Secondary | ICD-10-CM | POA: Diagnosis not present

## 2022-12-19 LAB — GLUCOSE, CAPILLARY
Glucose-Capillary: 86 mg/dL (ref 70–99)
Glucose-Capillary: 86 mg/dL (ref 70–99)
Glucose-Capillary: 95 mg/dL (ref 70–99)
Glucose-Capillary: 146 mg/dL — ABNORMAL HIGH (ref 70–99)

## 2022-12-19 LAB — CBC WITH DIFFERENTIAL/PLATELET
Abs Immature Granulocytes: 0.02 10*3/uL (ref 0.00–0.07)
Basophils Absolute: 0 10*3/uL (ref 0.0–0.1)
Basophils Relative: 0 %
Eosinophils Absolute: 0.2 10*3/uL (ref 0.0–0.5)
Eosinophils Relative: 5 %
HCT: 33.6 % — ABNORMAL LOW (ref 36.0–46.0)
Hemoglobin: 10.6 g/dL — ABNORMAL LOW (ref 12.0–15.0)
Immature Granulocytes: 1 %
Lymphocytes Relative: 21 %
Lymphs Abs: 0.9 10*3/uL (ref 0.7–4.0)
MCH: 32.1 pg (ref 26.0–34.0)
MCHC: 31.5 g/dL (ref 30.0–36.0)
MCV: 101.8 fL — ABNORMAL HIGH (ref 80.0–100.0)
Monocytes Absolute: 0.6 10*3/uL (ref 0.1–1.0)
Monocytes Relative: 14 %
Neutro Abs: 2.5 10*3/uL (ref 1.7–7.7)
Neutrophils Relative %: 59 %
Platelets: 167 10*3/uL (ref 150–400)
RBC: 3.3 MIL/uL — ABNORMAL LOW (ref 3.87–5.11)
RDW: 15.1 % (ref 11.5–15.5)
WBC: 4.1 10*3/uL (ref 4.0–10.5)
nRBC: 0 % (ref 0.0–0.2)

## 2022-12-19 LAB — ECHOCARDIOGRAM COMPLETE
AR max vel: 1.34 cm2
AV Area VTI: 1.37 cm2
AV Area mean vel: 1.33 cm2
AV Mean grad: 17 mm[Hg]
AV Peak grad: 32.3 mm[Hg]
Ao pk vel: 2.84 m/s
Area-P 1/2: 2.95 cm2
Est EF: >75
Height: 63 in
MV M vel: 4.31 m/s
MV Peak grad: 74.3 mm[Hg]
MV VTI: 1.84 cm2
S' Lateral: 2 cm
Weight: 2222.24 [oz_av]

## 2022-12-19 LAB — RENAL FUNCTION PANEL
Albumin: 3.4 g/dL — ABNORMAL LOW (ref 3.5–5.0)
Anion gap: 14 (ref 5–15)
BUN: 34 mg/dL — ABNORMAL HIGH (ref 8–23)
CO2: 26 mmol/L (ref 22–32)
Calcium: 9.4 mg/dL (ref 8.9–10.3)
Chloride: 97 mmol/L — ABNORMAL LOW (ref 98–111)
Creatinine, Ser: 5.75 mg/dL — ABNORMAL HIGH (ref 0.44–1.00)
GFR, Estimated: 7 mL/min — ABNORMAL LOW (ref >60)
Glucose, Bld: 83 mg/dL (ref 70–99)
Phosphorus: 6.1 mg/dL — ABNORMAL HIGH (ref 2.5–4.6)
Potassium: 4.1 mmol/L (ref 3.5–5.1)
Sodium: 137 mmol/L (ref 135–145)

## 2022-12-19 LAB — HEPATITIS B SURFACE ANTIBODY, QUANTITATIVE: Hep B S AB Quant (Post): <3.5 m[IU]/mL — ABNORMAL LOW

## 2022-12-19 NOTE — Plan of Care (Signed)

## 2022-12-19 NOTE — Plan of Care (Signed)
  Problem: Coping: Goal: Level of anxiety will decrease Outcome: Progressing   Problem: Pain Managment: Goal: General experience of comfort will improve Outcome: Progressing   

## 2022-12-19 NOTE — Progress Notes (Signed)
PROGRESS NOTE   Carly Wood  WUJ:[id number] DOB: 1935-09-15 DOA: 12/16/2022 PCP: Sharee Holster, NP   Chief Complaint  Patient presents with   Altered Mental Status   Level of care: Telemetry  Brief Admission History:  87 y.o. female,  with medical history significant for ESRD, hypertension, diabetes mellitus, dementia, multiple myeloma.  Patient presents to ED secondary to complaints of fever, chills, altered mental status at HD center, patient reports her arm feels heavy, she reports some headache, overall she is a poor historian, she denies any chest pain, shortness of breath, does not make urine, otherwise she cannot give any specific complaints.  Patient with known history of multiple myeloma, she is on Velcade infusion biweekly. -In ED patient was noted to be febrile 101.4, tachypneic, tachycardic, chest x-ray with no acute findings, anuric unable to obtain any urine by in and out,Creatinine at 4.8, BUN at 22, she is having left upper chest HD catheter, blood cultures were sent, she was started on broad-spectrum antibiotics and [facility] hospitalist consulted to admit.   Assessment and Plan: Staphylococcus Bacteremia - suspect from infected dialysis catheter - discussed with Dr. Marisue Humble, plan is to remove catheter after HD today - Dr. Henreitta Leber consulted to remove catheter - nephrology formulating a plan for new cath to be placed on Monday 10/14 - continue IV antibiotics - 2D echocardiogram pending - ID telemedicine consult when more culture data available - repeated blood cultures 12/17/22 No growth to date  ESRD on HD - HD today on schedule per nephrology team - HD cath removed by Dr. Henreitta Leber on 12/18/22 -  plan for cath holiday now, then replace cath on 10/14   History of MM - treated by Dr. Ellin Saba outpatient   History of breast CA  - stable on anastrozole  Macrocytic Anemia  - B12, folic acid within normal limits  DVT prophylaxis: SCD Code Status: DNR  Family  Communication:  Disposition: anticipate return to prior senior living    Consultants:  nephrology Procedures:  Hemodialysis 10/11 HD catheter removal 10/11  Antimicrobials:  Cefepime/vancomycin 10/1   Subjective: No complaints, tolerated TDC line removal with no problem.    Objective: Vitals:   12/18/22 2006 12/19/22 0433 12/19/22 0433 12/19/22 1351  BP: 139/70 139/79 139/79 (!) 140/77  Pulse: 85 74 74 84  Resp: 20  (!) 22   Temp: 98.8 F (37.1 C) 98 F (36.7 C) 98 F (36.7 C) 98.7 F (37.1 C)  TempSrc:  Oral  Oral  SpO2: 96% 95% 95% 100%  Weight:      Height:        Intake/Output Summary (Last 24 hours) at 12/19/2022 1410 Last data filed at 12/19/2022 0900 Gross per 24 hour  Intake 610 ml  Output 1500 ml  Net -890 ml   Filed Weights   12/16/22 2259 12/18/22 1035 12/18/22 1430  Weight: 62.1 kg 64.4 kg 63 kg   Examination:  General exam: chronically ill appearing female, awake, alert, cooperative, Appears calm and comfortable  Respiratory system:  Respiratory effort normal. Cardiovascular system: normal S1 & S2 heard. No JVD, murmurs, rubs, gallops or clicks. No pedal edema. Gastrointestinal system: Abdomen is nondistended, soft and nontender. No organomegaly or masses felt. Normal bowel sounds heard. Central nervous system: Alert and oriented but seems confused at times. No focal neurological deficits. Extremities: Symmetric 5 x 5 power. Skin: No rashes, lesions or ulcers. Psychiatry: Judgement and insight UTD.  Mood & affect appropriate.   Data Reviewed: I have  personally reviewed following labs and imaging studies  CBC: Recent Labs  Lab 12/16/22 1807 12/17/22 0434 12/17/22 0530 12/18/22 0429 12/19/22 0456  WBC 6.7 4.5  --  4.2 4.1  NEUTROABS 5.9  --  3.1 2.3 2.5  HGB 12.4 10.4*  --  10.0* 10.6*  HCT 38.3 32.3*  --  30.4* 33.6*  MCV 101.3* 101.9*  --  101.3* 101.8*  PLT 201 129*  --  150 167    Basic Metabolic Panel: Recent Labs  Lab  12/16/22 1807 12/17/22 0434 12/18/22 1118 12/19/22 0456  NA 133* 133* 131* 137  K 4.1 4.6 4.9 4.1  CL 93* 95* 92* 97*  CO2 24 24 24 26   GLUCOSE 123* 72 149* 83  BUN 22 28* 60* 34*  CREATININE 4.81* 6.00* 8.82* 5.75*  CALCIUM 9.4 9.4 8.5* 9.4  PHOS  --   --  7.9* 6.1*    CBG: Recent Labs  Lab 12/17/22 1620 12/17/22 2127 12/18/22 2209 12/19/22 0719 12/19/22 1153  GLUCAP 82 116* 94 86 86    Recent Results (from the past 240 hour(s))  Blood Culture (routine x 2)     Status: None (Preliminary result)   Collection Time: 12/16/22  6:07 PM   Specimen: BLOOD  Result Value Ref Range Status   Specimen Description BLOOD BLOOD RIGHT ARM  Final   Special Requests   Final    Blood Culture results may not be optimal due to an inadequate volume of blood received in culture bottles BOTTLES DRAWN AEROBIC AND ANAEROBIC   Culture   Final    NO GROWTH 3 DAYS Performed at [facility], [address] [place], [place] [address]    Report Status PENDING  Incomplete  Blood Culture (routine x 2)     Status: Abnormal (Preliminary result)   Collection Time: 12/16/22  7:03 PM   Specimen: BLOOD  Result Value Ref Range Status   Specimen Description   Final    BLOOD BLOOD RIGHT ARM Performed at [facility], [address], [place], [place] [address]    Special Requests   Final    BOTTLES DRAWN AEROBIC AND ANAEROBIC Blood Culture results may not be optimal due to an excessive volume of blood received in culture bottles Performed at [facility], [address], [place], [place] [address]    Culture  Setup Time   Final    GRAM POSITIVE COCCI AEROBIC BOTTLE ONLY CRITICAL RESULT CALLED TO, READ BACK BY AND VERIFIED WITH: ROBERTSON,B AT 1613 ON 12/17/22 BY PURIE,J CRITICAL RESULT CALLED TO, READ BACK BY AND VERIFIED WITH: S HARDIN,RN@0203  12/18/22 MK    Culture (A)  Final    STAPHYLOCOCCUS HOMINIS THE SIGNIFICANCE OF ISOLATING THIS ORGANISM FROM A SINGLE SET OF BLOOD CULTURES WHEN MULTIPLE SETS  ARE DRAWN IS UNCERTAIN. PLEASE NOTIFY THE MICROBIOLOGY DEPARTMENT WITHIN ONE WEEK IF SPECIATION AND SENSITIVITIES ARE REQUIRED. Performed at [facility], [address] [place], [place] [address]    Report Status PENDING  Incomplete  Blood Culture ID Panel (Reflexed)     Status: Abnormal   Collection Time: 12/16/22  7:03 PM  Result Value Ref Range Status   Enterococcus faecalis NOT DETECTED NOT DETECTED Final   Enterococcus Faecium NOT DETECTED NOT DETECTED Final   Listeria monocytogenes NOT DETECTED NOT DETECTED Final   Staphylococcus species DETECTED (A) NOT DETECTED Final    Comment: CRITICAL RESULT CALLED TO, READ BACK BY AND VERIFIED WITH: S HARDIN,RN@0204  12/18/22 MK    Staphylococcus  aureus (BCID) NOT DETECTED NOT DETECTED Final   Staphylococcus epidermidis NOT DETECTED NOT DETECTED Final   Staphylococcus lugdunensis NOT DETECTED NOT DETECTED Final   Streptococcus species NOT DETECTED NOT DETECTED Final   Streptococcus agalactiae NOT DETECTED NOT DETECTED Final   Streptococcus pneumoniae NOT DETECTED NOT DETECTED Final   Streptococcus pyogenes NOT DETECTED NOT DETECTED Final   A.calcoaceticus-baumannii NOT DETECTED NOT DETECTED Final   Bacteroides fragilis NOT DETECTED NOT DETECTED Final   Enterobacterales NOT DETECTED NOT DETECTED Final   Enterobacter cloacae complex NOT DETECTED NOT DETECTED Final   Escherichia coli NOT DETECTED NOT DETECTED Final   Klebsiella aerogenes NOT DETECTED NOT DETECTED Final   Klebsiella oxytoca NOT DETECTED NOT DETECTED Final   Klebsiella pneumoniae NOT DETECTED NOT DETECTED Final   Proteus species NOT DETECTED NOT DETECTED Final   Salmonella species NOT DETECTED NOT DETECTED Final   Serratia marcescens NOT DETECTED NOT DETECTED Final   Haemophilus influenzae NOT DETECTED NOT DETECTED Final   Neisseria meningitidis NOT DETECTED NOT DETECTED Final   Pseudomonas aeruginosa NOT DETECTED NOT DETECTED Final   Stenotrophomonas maltophilia NOT  DETECTED NOT DETECTED Final   Candida albicans NOT DETECTED NOT DETECTED Final   Candida auris NOT DETECTED NOT DETECTED Final   Candida glabrata NOT DETECTED NOT DETECTED Final   Candida krusei NOT DETECTED NOT DETECTED Final   Candida parapsilosis NOT DETECTED NOT DETECTED Final   Candida tropicalis NOT DETECTED NOT DETECTED Final   Cryptococcus neoformans/gattii NOT DETECTED NOT DETECTED Final    Comment: Performed at [facility], [address] [place], [place] [address]  Resp panel by RT-PCR (RSV, Flu A&B, Covid) Anterior Nasal Swab     Status: None   Collection Time: 12/16/22  7:42 PM   Specimen: Anterior Nasal Swab  Result Value Ref Range Status   SARS Coronavirus 2 by RT PCR NEGATIVE NEGATIVE Final    Comment: (NOTE) SARS-CoV-2 target nucleic acids are NOT DETECTED.  The SARS-CoV-2 RNA is generally detectable in upper respiratory specimens during the acute phase of infection. The lowest concentration of SARS-CoV-2 viral copies this assay can detect is 138 copies/mL. A negative result does not preclude SARS-Cov-2 infection and should not be used as the sole basis for treatment or other patient management decisions. A negative result may occur with  improper specimen collection/handling, submission of specimen other than nasopharyngeal swab, presence of viral mutation(s) within the areas targeted by this assay, and inadequate number of viral copies(<138 copies/mL). A negative result must be combined with clinical observations, patient history, and epidemiological information. The expected result is Negative.  Fact Sheet for Patients:  BloggerCourse.com  Fact Sheet for Healthcare Providers:  SeriousBroker.it  This test is no t yet approved or cleared by the [place] FDA and  has been authorized for detection and/or diagnosis of SARS-CoV-2 by FDA under an Emergency Use Authorization (EUA). This EUA will remain   in effect (meaning this test can be used) for the duration of the COVID-19 declaration under Section 564(b)(1) of the Act, 21 U.S.C.section 360bbb-3(b)(1), unless the authorization is terminated  or revoked sooner.       Influenza A by PCR NEGATIVE NEGATIVE Final   Influenza B by PCR NEGATIVE NEGATIVE Final    Comment: (NOTE) The Xpert Xpress SARS-CoV-2/FLU/RSV plus assay is intended as an aid in the diagnosis of influenza from Nasopharyngeal swab specimens and should not be used as a sole basis for treatment. Nasal washings and aspirates are unacceptable for Xpert  Xpress SARS-CoV-2/FLU/RSV testing.  Fact Sheet for Patients: BloggerCourse.com  Fact Sheet for Healthcare Providers: SeriousBroker.it  This test is not yet approved or cleared by the [place] FDA and has been authorized for detection and/or diagnosis of SARS-CoV-2 by FDA under an Emergency Use Authorization (EUA). This EUA will remain in effect (meaning this test can be used) for the duration of the COVID-19 declaration under Section 564(b)(1) of the Act, 21 U.S.C. section 360bbb-3(b)(1), unless the authorization is terminated or revoked.     Resp Syncytial Virus by PCR NEGATIVE NEGATIVE Final    Comment: (NOTE) Fact Sheet for Patients: BloggerCourse.com  Fact Sheet for Healthcare Providers: SeriousBroker.it  This test is not yet approved or cleared by the [place] FDA and has been authorized for detection and/or diagnosis of SARS-CoV-2 by FDA under an Emergency Use Authorization (EUA). This EUA will remain in effect (meaning this test can be used) for the duration of the COVID-19 declaration under Section 564(b)(1) of the Act, 21 U.S.C. section 360bbb-3(b)(1), unless the authorization is terminated or revoked.  Performed at [facility], [address], [place], [place] [address]   Culture, blood  (Routine X 2) w Reflex to ID Panel     Status: None (Preliminary result)   Collection Time: 12/17/22  7:24 PM   Specimen: BLOOD  Result Value Ref Range Status   Specimen Description BLOOD LEFT ANTECUBITAL  Final   Special Requests   Final    BOTTLES DRAWN AEROBIC AND ANAEROBIC Blood Culture results may not be optimal due to an excessive volume of blood received in culture bottles   Culture   Final    NO GROWTH 2 DAYS Performed at [facility], [address] [place], [place] [address]    Report Status PENDING  Incomplete  Culture, blood (Routine X 2) w Reflex to ID Panel     Status: None (Preliminary result)   Collection Time: 12/17/22  7:26 PM   Specimen: BLOOD  Result Value Ref Range Status   Specimen Description BLOOD BLOOD LEFT HAND  Final   Special Requests   Final    BOTTLES DRAWN AEROBIC ONLY Blood Culture results may not be optimal due to an excessive volume of blood received in culture bottles   Culture   Final    NO GROWTH 2 DAYS Performed at [facility], [address] [place], [place] [address]    Report Status PENDING  Incomplete     Radiology Studies: DG Chest Port 1 View  Result Date: 12/18/2022 CLINICAL DATA:  Removal of vascular catheter EXAM: PORTABLE CHEST 1 VIEW COMPARISON:  12/16/2022 FINDINGS: Interim removal of left-sided vascular catheter. No acute airspace disease or pleural effusion. Stable cardiomediastinal silhouette with aortic atherosclerosis. No pneumothorax. Coarse oval calcification projecting over left breast soft tissues. Skin fold artifact right chest. IMPRESSION: Interim removal of left-sided vascular catheter. Lung fields are clear aside from minimal atelectasis or scarring in the left lower lung Electronically Signed   By: Jasmine Pang M.D.   On: 12/18/2022 16:55    Scheduled Meds:  acyclovir  200 mg Oral q AM   anastrozole  1 mg Oral Daily   Chlorhexidine Gluconate Cloth  6 each Topical Daily   melatonin  6 mg Oral QHS   Continuous  Infusions:  ceFEPime (MAXIPIME) IV 200 mL/hr at 12/18/22 1738   vancomycin 150 mL/hr at 12/18/22 1738     LOS: 3 days   Time spent: 37 mins  Pascual Mantel Laural Benes, MD How to contact  the [facility] Attending or Consulting provider 7A - 7P or covering provider during after hours 7P -7A, for this patient?  Check the care team in [facility] and look for a) attending/consulting [facility] provider listed and b) the [facility] team listed Log into [email or web address] to find provider on call.  Locate the [facility] provider you are looking for under [facility] and page to a number that you can be directly reached. If you still have difficulty reaching the provider, please page the [facility] (Director on Call) for the Hospitalists listed on amion for assistance.  12/19/2022, 2:10 PM

## 2022-12-20 DIAGNOSIS — T829XXA Unspecified complication of cardiac and vascular prosthetic device, implant and graft, initial encounter: Secondary | ICD-10-CM | POA: Diagnosis not present

## 2022-12-20 DIAGNOSIS — R7881 Bacteremia: Secondary | ICD-10-CM | POA: Diagnosis not present

## 2022-12-20 DIAGNOSIS — K219 Gastro-esophageal reflux disease without esophagitis: Secondary | ICD-10-CM | POA: Diagnosis not present

## 2022-12-20 LAB — CULTURE, BLOOD (ROUTINE X 2)

## 2022-12-20 LAB — CBC WITH DIFFERENTIAL/PLATELET
Abs Immature Granulocytes: 0 10*3/uL (ref 0.00–0.07)
Basophils Absolute: 0 10*3/uL (ref 0.0–0.1)
Basophils Relative: 1 %
Eosinophils Absolute: 0.2 10*3/uL (ref 0.0–0.5)
Eosinophils Relative: 6 %
HCT: 33.4 % — ABNORMAL LOW (ref 36.0–46.0)
Hemoglobin: 10.2 g/dL — ABNORMAL LOW (ref 12.0–15.0)
Immature Granulocytes: 0 %
Lymphocytes Relative: 21 %
Lymphs Abs: 0.8 10*3/uL (ref 0.7–4.0)
MCH: 31.5 pg (ref 26.0–34.0)
MCHC: 30.5 g/dL (ref 30.0–36.0)
MCV: 103.1 fL — ABNORMAL HIGH (ref 80.0–100.0)
Monocytes Absolute: 0.6 10*3/uL (ref 0.1–1.0)
Monocytes Relative: 15 %
Neutro Abs: 2.3 10*3/uL (ref 1.7–7.7)
Neutrophils Relative %: 57 %
Platelets: 162 10*3/uL (ref 150–400)
RBC: 3.24 MIL/uL — ABNORMAL LOW (ref 3.87–5.11)
RDW: 15 % (ref 11.5–15.5)
WBC: 4 10*3/uL (ref 4.0–10.5)
nRBC: 0 % (ref 0.0–0.2)

## 2022-12-20 LAB — RENAL FUNCTION PANEL
Albumin: 3.3 g/dL — ABNORMAL LOW (ref 3.5–5.0)
Anion gap: 15 (ref 5–15)
BUN: 53 mg/dL — ABNORMAL HIGH (ref 8–23)
CO2: 24 mmol/L (ref 22–32)
Calcium: 9 mg/dL (ref 8.9–10.3)
Chloride: 97 mmol/L — ABNORMAL LOW (ref 98–111)
Creatinine, Ser: 8 mg/dL — ABNORMAL HIGH (ref 0.44–1.00)
GFR, Estimated: 5 mL/min — ABNORMAL LOW (ref >60)
Glucose, Bld: 80 mg/dL (ref 70–99)
Phosphorus: 7.8 mg/dL — ABNORMAL HIGH (ref 2.5–4.6)
Potassium: 4.4 mmol/L (ref 3.5–5.1)
Sodium: 136 mmol/L (ref 135–145)

## 2022-12-20 LAB — GLUCOSE, CAPILLARY
Glucose-Capillary: 78 mg/dL (ref 70–99)
Glucose-Capillary: 152 mg/dL — ABNORMAL HIGH (ref 70–99)

## 2022-12-20 NOTE — Progress Notes (Signed)
Pharmacy Antibiotic Note  Carly Wood is a 87 y.o. female admitted on 12/16/2022 with sepsis.  Pharmacy has been consulted for vancomycin dosing. PMH includes HD where she became unresponsive after completing dialysis on 10/9.   Plan: Cefepime 2g qhd - mwf Vanc 750mg  mwf Follow-up ID recommendations   Height: 5\' 3"  (160 cm) Weight: 63 kg (138 lb 14.2 oz) IBW/kg (Calculated) : 52.4  Temp (24hrs), Avg:98.2 F (36.8 C), Min:97.5 F (36.4 C), Max:98.7 F (37.1 C)  Recent Labs  Lab 12/16/22 1807 12/16/22 2012 12/17/22 0434 12/18/22 0429 12/18/22 1118 12/19/22 0456 12/20/22 0526  WBC 6.7  --  4.5 4.2  --  4.1 4.0  CREATININE 4.81*  --  6.00*  --  8.82* 5.75* 8.00*  LATICACIDVEN 1.4 1.1  --   --   --   --   --     Estimated Creatinine Clearance: 4.7 mL/min (A) (by C-G formula based on SCr of 8 mg/dL (H)).    Allergies  Allergen Reactions   Ace Inhibitors Other (See Comments) and Cough    Tongue swelling Angioedema    Angiotensin Receptor Blockers Other (See Comments)    Angioedema with ACE-I   Penicillins Other (See Comments)    Unknown reaction    Antimicrobials this admission: Vanc 10/9 >> Cefepime 10/9> Metronidazole 10/9   Microbiology results: 10/10 Bcx: ngtd 10/9 bldx2: gram + cocci 1 bottle BCID: staph hominis  Thank you for allowing pharmacy to be a part of this patient's care.  Judeth Cornfield, PharmD Clinical Pharmacist 12/20/2022 10:50 AM

## 2022-12-20 NOTE — Plan of Care (Signed)

## 2022-12-20 NOTE — Progress Notes (Signed)
PROGRESS NOTE   Carly Wood  ZOX:[id number] DOB: 10-14-1935 DOA: 12/16/2022 PCP: Sharee Holster, NP   Chief Complaint  Patient presents with   Altered Mental Status   Level of care: Telemetry  Brief Admission History:  87 y.o. female,  with medical history significant for ESRD, hypertension, diabetes mellitus, dementia, multiple myeloma.  Patient presents to ED secondary to complaints of fever, chills, altered mental status at HD center, patient reports her arm feels heavy, she reports some headache, overall she is a poor historian, she denies any chest pain, shortness of breath, does not make urine, otherwise she cannot give any specific complaints.  Patient with known history of multiple myeloma, she is on Velcade infusion biweekly. -In ED patient was noted to be febrile 101.4, tachypneic, tachycardic, chest x-ray with no acute findings, anuric unable to obtain any urine by in and out,Creatinine at 4.8, BUN at 22, she is having left upper chest HD catheter, blood cultures were sent, she was started on broad-spectrum antibiotics and [facility] hospitalist consulted to admit.   Assessment and Plan:  Staphylococcus hominis Bacteremia - suspect from infected dialysis catheter - discussed with Dr. Marisue Humble, plan is to remove catheter after HD today - Dr. Henreitta Leber consulted to remove catheter - nephrology formulating a plan for new cath to be placed on Monday 10/14 - continue IV antibiotics - 2D echocardiogram no report of vegetation - ID telemedicine consult when more culture data available - repeated blood cultures 12/17/22 No growth to date  ESRD on HD - HD today on schedule per nephrology team - HD cath removed by Dr. Henreitta Leber on 12/18/22 -  plan for cath holiday now, then replace cath around 10/14   History of MM - treated by Dr. Ellin Saba outpatient   History of breast CA  - stable on anastrozole  Macrocytic Anemia  - B12, folic acid within normal limits  DVT prophylaxis:  SCD Code Status: DNR  Family Communication:  Disposition: anticipate return to prior senior living    Consultants:  nephrology Procedures:  Hemodialysis 10/11 HD catheter removal 10/11  Antimicrobials:  Cefepime/vancomycin 10/1   Subjective: Pt reports no specific complaints.  Objective: Vitals:   12/19/22 0433 12/19/22 1351 12/19/22 2042 12/20/22 0500  BP: 139/79 (!) 140/77 (!) 142/71 (!) 156/79  Pulse: 74 84 84 74  Resp: (!) 22     Temp: 98 F (36.7 C) 98.7 F (37.1 C) 98.5 F (36.9 C) (!) 97.5 F (36.4 C)  TempSrc:  Oral Oral Oral  SpO2: 95% 100% 95% 98%  Weight:      Height:        Intake/Output Summary (Last 24 hours) at 12/20/2022 1107 Last data filed at 12/19/2022 2130 Gross per 24 hour  Intake 540 ml  Output --  Net 540 ml   Filed Weights   12/16/22 2259 12/18/22 1035 12/18/22 1430  Weight: 62.1 kg 64.4 kg 63 kg   Examination:  General exam: chronically ill appearing female, awake, alert, cooperative, Appears calm and comfortable  Respiratory system:  Respiratory effort normal. Cardiovascular system: normal S1 & S2 heard. No JVD, murmurs, rubs, gallops or clicks. No pedal edema. Gastrointestinal system: Abdomen is nondistended, soft and nontender. No organomegaly or masses felt. Normal bowel sounds heard. Central nervous system: Alert and oriented but seems confused at times. No focal neurological deficits. Extremities: Symmetric 5 x 5 power. Skin: No rashes, lesions or ulcers. Psychiatry: Judgement and insight UTD.  Mood & affect appropriate.   Data Reviewed: I  PROGRESS NOTE   Carly Wood  ZOX:[id number] DOB: 10-14-1935 DOA: 12/16/2022 PCP: Sharee Holster, NP   Chief Complaint  Patient presents with   Altered Mental Status   Level of care: Telemetry  Brief Admission History:  87 y.o. female,  with medical history significant for ESRD, hypertension, diabetes mellitus, dementia, multiple myeloma.  Patient presents to ED secondary to complaints of fever, chills, altered mental status at HD center, patient reports her arm feels heavy, she reports some headache, overall she is a poor historian, she denies any chest pain, shortness of breath, does not make urine, otherwise she cannot give any specific complaints.  Patient with known history of multiple myeloma, she is on Velcade infusion biweekly. -In ED patient was noted to be febrile 101.4, tachypneic, tachycardic, chest x-ray with no acute findings, anuric unable to obtain any urine by in and out,Creatinine at 4.8, BUN at 22, she is having left upper chest HD catheter, blood cultures were sent, she was started on broad-spectrum antibiotics and [facility] hospitalist consulted to admit.   Assessment and Plan:  Staphylococcus hominis Bacteremia - suspect from infected dialysis catheter - discussed with Dr. Marisue Humble, plan is to remove catheter after HD today - Dr. Henreitta Leber consulted to remove catheter - nephrology formulating a plan for new cath to be placed on Monday 10/14 - continue IV antibiotics - 2D echocardiogram no report of vegetation - ID telemedicine consult when more culture data available - repeated blood cultures 12/17/22 No growth to date  ESRD on HD - HD today on schedule per nephrology team - HD cath removed by Dr. Henreitta Leber on 12/18/22 -  plan for cath holiday now, then replace cath around 10/14   History of MM - treated by Dr. Ellin Saba outpatient   History of breast CA  - stable on anastrozole  Macrocytic Anemia  - B12, folic acid within normal limits  DVT prophylaxis:  SCD Code Status: DNR  Family Communication:  Disposition: anticipate return to prior senior living    Consultants:  nephrology Procedures:  Hemodialysis 10/11 HD catheter removal 10/11  Antimicrobials:  Cefepime/vancomycin 10/1   Subjective: Pt reports no specific complaints.  Objective: Vitals:   12/19/22 0433 12/19/22 1351 12/19/22 2042 12/20/22 0500  BP: 139/79 (!) 140/77 (!) 142/71 (!) 156/79  Pulse: 74 84 84 74  Resp: (!) 22     Temp: 98 F (36.7 C) 98.7 F (37.1 C) 98.5 F (36.9 C) (!) 97.5 F (36.4 C)  TempSrc:  Oral Oral Oral  SpO2: 95% 100% 95% 98%  Weight:      Height:        Intake/Output Summary (Last 24 hours) at 12/20/2022 1107 Last data filed at 12/19/2022 2130 Gross per 24 hour  Intake 540 ml  Output --  Net 540 ml   Filed Weights   12/16/22 2259 12/18/22 1035 12/18/22 1430  Weight: 62.1 kg 64.4 kg 63 kg   Examination:  General exam: chronically ill appearing female, awake, alert, cooperative, Appears calm and comfortable  Respiratory system:  Respiratory effort normal. Cardiovascular system: normal S1 & S2 heard. No JVD, murmurs, rubs, gallops or clicks. No pedal edema. Gastrointestinal system: Abdomen is nondistended, soft and nontender. No organomegaly or masses felt. Normal bowel sounds heard. Central nervous system: Alert and oriented but seems confused at times. No focal neurological deficits. Extremities: Symmetric 5 x 5 power. Skin: No rashes, lesions or ulcers. Psychiatry: Judgement and insight UTD.  Mood & affect appropriate.   Data Reviewed: I  approved or cleared by the [place] and has been authorized for detection and/or diagnosis of SARS-CoV-2 by FDA under an Emergency Use Authorization (EUA). This EUA will remain in effect (meaning this test can be used) for the duration of the COVID-19 declaration under Section 564(b)(1) of the Act, 21 U.S.C. section 360bbb-3(b)(1), unless the authorization is terminated or revoked.     Resp Syncytial Virus by PCR NEGATIVE NEGATIVE Final    Comment: (NOTE) Fact Sheet for Patients: BloggerCourse.com  Fact Sheet for Healthcare Providers: SeriousBroker.it  This test is not yet approved or cleared by the [place] FDA and has been authorized for detection and/or diagnosis of SARS-CoV-2 by FDA under an Emergency Use Authorization (EUA). This EUA will remain in effect (meaning this test can be used) for the duration of the COVID-19 declaration under Section 564(b)(1) of the Act, 21 U.S.C. section 360bbb-3(b)(1), unless the authorization is terminated or revoked.  Performed at [facility], [address], [place], [place] [address]   Culture, blood (Routine X 2) w Reflex to ID Panel     Status: None (Preliminary result)   Collection Time: 12/17/22  7:24 PM   Specimen: BLOOD  Result Value  Ref Range Status   Specimen Description BLOOD LEFT ANTECUBITAL  Final   Special Requests   Final    BOTTLES DRAWN AEROBIC AND ANAEROBIC Blood Culture results may not be optimal due to an excessive volume of blood received in culture bottles   Culture   Final    NO GROWTH 3 DAYS Performed at [facility], [address], [place], [place] [address]    Report Status PENDING  Incomplete  Culture, blood (Routine X 2) w Reflex to ID Panel     Status: None (Preliminary result)   Collection Time: 12/17/22  7:26 PM   Specimen: BLOOD  Result Value Ref Range Status   Specimen Description BLOOD BLOOD LEFT HAND  Final   Special Requests   Final    BOTTLES DRAWN AEROBIC ONLY Blood Culture results may not be optimal due to an excessive volume of blood received in culture bottles   Culture   Final    NO GROWTH 3 DAYS Performed at [facility], [address] [place], [place] [address]    Report Status PENDING  Incomplete     Radiology Studies: ECHOCARDIOGRAM COMPLETE  Result Date: 12/19/2022    ECHOCARDIOGRAM REPORT   Patient Name:   Carly Wood Date of Exam: 12/18/2022 Medical Rec #:  [id number]      Height:       63.0 in Accession #:    [id number]     Weight:       138.9 lb Date of Birth:  [birth year]     BSA:          1.656 m Patient Age:    86 years       BP:           142/70 mmHg Patient Gender: F              HR:           107 bpm. Exam Location:  Inpatient Procedure: 2D Echo, Cardiac Doppler and Color Doppler Indications:    Bacteremia R78.81  History:        Patient has prior history of Echocardiogram examinations, most                 recent 09/28/2021. Signs/Symptoms:Syncope; Risk  approved or cleared by the [place] and has been authorized for detection and/or diagnosis of SARS-CoV-2 by FDA under an Emergency Use Authorization (EUA). This EUA will remain in effect (meaning this test can be used) for the duration of the COVID-19 declaration under Section 564(b)(1) of the Act, 21 U.S.C. section 360bbb-3(b)(1), unless the authorization is terminated or revoked.     Resp Syncytial Virus by PCR NEGATIVE NEGATIVE Final    Comment: (NOTE) Fact Sheet for Patients: BloggerCourse.com  Fact Sheet for Healthcare Providers: SeriousBroker.it  This test is not yet approved or cleared by the [place] FDA and has been authorized for detection and/or diagnosis of SARS-CoV-2 by FDA under an Emergency Use Authorization (EUA). This EUA will remain in effect (meaning this test can be used) for the duration of the COVID-19 declaration under Section 564(b)(1) of the Act, 21 U.S.C. section 360bbb-3(b)(1), unless the authorization is terminated or revoked.  Performed at [facility], [address], [place], [place] [address]   Culture, blood (Routine X 2) w Reflex to ID Panel     Status: None (Preliminary result)   Collection Time: 12/17/22  7:24 PM   Specimen: BLOOD  Result Value  Ref Range Status   Specimen Description BLOOD LEFT ANTECUBITAL  Final   Special Requests   Final    BOTTLES DRAWN AEROBIC AND ANAEROBIC Blood Culture results may not be optimal due to an excessive volume of blood received in culture bottles   Culture   Final    NO GROWTH 3 DAYS Performed at [facility], [address], [place], [place] [address]    Report Status PENDING  Incomplete  Culture, blood (Routine X 2) w Reflex to ID Panel     Status: None (Preliminary result)   Collection Time: 12/17/22  7:26 PM   Specimen: BLOOD  Result Value Ref Range Status   Specimen Description BLOOD BLOOD LEFT HAND  Final   Special Requests   Final    BOTTLES DRAWN AEROBIC ONLY Blood Culture results may not be optimal due to an excessive volume of blood received in culture bottles   Culture   Final    NO GROWTH 3 DAYS Performed at [facility], [address] [place], [place] [address]    Report Status PENDING  Incomplete     Radiology Studies: ECHOCARDIOGRAM COMPLETE  Result Date: 12/19/2022    ECHOCARDIOGRAM REPORT   Patient Name:   Carly Wood Date of Exam: 12/18/2022 Medical Rec #:  [id number]      Height:       63.0 in Accession #:    [id number]     Weight:       138.9 lb Date of Birth:  [birth year]     BSA:          1.656 m Patient Age:    86 years       BP:           142/70 mmHg Patient Gender: F              HR:           107 bpm. Exam Location:  Inpatient Procedure: 2D Echo, Cardiac Doppler and Color Doppler Indications:    Bacteremia R78.81  History:        Patient has prior history of Echocardiogram examinations, most                 recent 09/28/2021. Signs/Symptoms:Syncope; Risk  PROGRESS NOTE   Carly Wood  ZOX:[id number] DOB: 10-14-1935 DOA: 12/16/2022 PCP: Sharee Holster, NP   Chief Complaint  Patient presents with   Altered Mental Status   Level of care: Telemetry  Brief Admission History:  87 y.o. female,  with medical history significant for ESRD, hypertension, diabetes mellitus, dementia, multiple myeloma.  Patient presents to ED secondary to complaints of fever, chills, altered mental status at HD center, patient reports her arm feels heavy, she reports some headache, overall she is a poor historian, she denies any chest pain, shortness of breath, does not make urine, otherwise she cannot give any specific complaints.  Patient with known history of multiple myeloma, she is on Velcade infusion biweekly. -In ED patient was noted to be febrile 101.4, tachypneic, tachycardic, chest x-ray with no acute findings, anuric unable to obtain any urine by in and out,Creatinine at 4.8, BUN at 22, she is having left upper chest HD catheter, blood cultures were sent, she was started on broad-spectrum antibiotics and [facility] hospitalist consulted to admit.   Assessment and Plan:  Staphylococcus hominis Bacteremia - suspect from infected dialysis catheter - discussed with Dr. Marisue Humble, plan is to remove catheter after HD today - Dr. Henreitta Leber consulted to remove catheter - nephrology formulating a plan for new cath to be placed on Monday 10/14 - continue IV antibiotics - 2D echocardiogram no report of vegetation - ID telemedicine consult when more culture data available - repeated blood cultures 12/17/22 No growth to date  ESRD on HD - HD today on schedule per nephrology team - HD cath removed by Dr. Henreitta Leber on 12/18/22 -  plan for cath holiday now, then replace cath around 10/14   History of MM - treated by Dr. Ellin Saba outpatient   History of breast CA  - stable on anastrozole  Macrocytic Anemia  - B12, folic acid within normal limits  DVT prophylaxis:  SCD Code Status: DNR  Family Communication:  Disposition: anticipate return to prior senior living    Consultants:  nephrology Procedures:  Hemodialysis 10/11 HD catheter removal 10/11  Antimicrobials:  Cefepime/vancomycin 10/1   Subjective: Pt reports no specific complaints.  Objective: Vitals:   12/19/22 0433 12/19/22 1351 12/19/22 2042 12/20/22 0500  BP: 139/79 (!) 140/77 (!) 142/71 (!) 156/79  Pulse: 74 84 84 74  Resp: (!) 22     Temp: 98 F (36.7 C) 98.7 F (37.1 C) 98.5 F (36.9 C) (!) 97.5 F (36.4 C)  TempSrc:  Oral Oral Oral  SpO2: 95% 100% 95% 98%  Weight:      Height:        Intake/Output Summary (Last 24 hours) at 12/20/2022 1107 Last data filed at 12/19/2022 2130 Gross per 24 hour  Intake 540 ml  Output --  Net 540 ml   Filed Weights   12/16/22 2259 12/18/22 1035 12/18/22 1430  Weight: 62.1 kg 64.4 kg 63 kg   Examination:  General exam: chronically ill appearing female, awake, alert, cooperative, Appears calm and comfortable  Respiratory system:  Respiratory effort normal. Cardiovascular system: normal S1 & S2 heard. No JVD, murmurs, rubs, gallops or clicks. No pedal edema. Gastrointestinal system: Abdomen is nondistended, soft and nontender. No organomegaly or masses felt. Normal bowel sounds heard. Central nervous system: Alert and oriented but seems confused at times. No focal neurological deficits. Extremities: Symmetric 5 x 5 power. Skin: No rashes, lesions or ulcers. Psychiatry: Judgement and insight UTD.  Mood & affect appropriate.   Data Reviewed: I  approved or cleared by the [place] and has been authorized for detection and/or diagnosis of SARS-CoV-2 by FDA under an Emergency Use Authorization (EUA). This EUA will remain in effect (meaning this test can be used) for the duration of the COVID-19 declaration under Section 564(b)(1) of the Act, 21 U.S.C. section 360bbb-3(b)(1), unless the authorization is terminated or revoked.     Resp Syncytial Virus by PCR NEGATIVE NEGATIVE Final    Comment: (NOTE) Fact Sheet for Patients: BloggerCourse.com  Fact Sheet for Healthcare Providers: SeriousBroker.it  This test is not yet approved or cleared by the [place] FDA and has been authorized for detection and/or diagnosis of SARS-CoV-2 by FDA under an Emergency Use Authorization (EUA). This EUA will remain in effect (meaning this test can be used) for the duration of the COVID-19 declaration under Section 564(b)(1) of the Act, 21 U.S.C. section 360bbb-3(b)(1), unless the authorization is terminated or revoked.  Performed at [facility], [address], [place], [place] [address]   Culture, blood (Routine X 2) w Reflex to ID Panel     Status: None (Preliminary result)   Collection Time: 12/17/22  7:24 PM   Specimen: BLOOD  Result Value  Ref Range Status   Specimen Description BLOOD LEFT ANTECUBITAL  Final   Special Requests   Final    BOTTLES DRAWN AEROBIC AND ANAEROBIC Blood Culture results may not be optimal due to an excessive volume of blood received in culture bottles   Culture   Final    NO GROWTH 3 DAYS Performed at [facility], [address], [place], [place] [address]    Report Status PENDING  Incomplete  Culture, blood (Routine X 2) w Reflex to ID Panel     Status: None (Preliminary result)   Collection Time: 12/17/22  7:26 PM   Specimen: BLOOD  Result Value Ref Range Status   Specimen Description BLOOD BLOOD LEFT HAND  Final   Special Requests   Final    BOTTLES DRAWN AEROBIC ONLY Blood Culture results may not be optimal due to an excessive volume of blood received in culture bottles   Culture   Final    NO GROWTH 3 DAYS Performed at [facility], [address] [place], [place] [address]    Report Status PENDING  Incomplete     Radiology Studies: ECHOCARDIOGRAM COMPLETE  Result Date: 12/19/2022    ECHOCARDIOGRAM REPORT   Patient Name:   Carly Wood Date of Exam: 12/18/2022 Medical Rec #:  [id number]      Height:       63.0 in Accession #:    [id number]     Weight:       138.9 lb Date of Birth:  [birth year]     BSA:          1.656 m Patient Age:    86 years       BP:           142/70 mmHg Patient Gender: F              HR:           107 bpm. Exam Location:  Inpatient Procedure: 2D Echo, Cardiac Doppler and Color Doppler Indications:    Bacteremia R78.81  History:        Patient has prior history of Echocardiogram examinations, most                 recent 09/28/2021. Signs/Symptoms:Syncope; Risk  PROGRESS NOTE   Carly Wood  ZOX:[id number] DOB: 10-14-1935 DOA: 12/16/2022 PCP: Sharee Holster, NP   Chief Complaint  Patient presents with   Altered Mental Status   Level of care: Telemetry  Brief Admission History:  87 y.o. female,  with medical history significant for ESRD, hypertension, diabetes mellitus, dementia, multiple myeloma.  Patient presents to ED secondary to complaints of fever, chills, altered mental status at HD center, patient reports her arm feels heavy, she reports some headache, overall she is a poor historian, she denies any chest pain, shortness of breath, does not make urine, otherwise she cannot give any specific complaints.  Patient with known history of multiple myeloma, she is on Velcade infusion biweekly. -In ED patient was noted to be febrile 101.4, tachypneic, tachycardic, chest x-ray with no acute findings, anuric unable to obtain any urine by in and out,Creatinine at 4.8, BUN at 22, she is having left upper chest HD catheter, blood cultures were sent, she was started on broad-spectrum antibiotics and [facility] hospitalist consulted to admit.   Assessment and Plan:  Staphylococcus hominis Bacteremia - suspect from infected dialysis catheter - discussed with Dr. Marisue Humble, plan is to remove catheter after HD today - Dr. Henreitta Leber consulted to remove catheter - nephrology formulating a plan for new cath to be placed on Monday 10/14 - continue IV antibiotics - 2D echocardiogram no report of vegetation - ID telemedicine consult when more culture data available - repeated blood cultures 12/17/22 No growth to date  ESRD on HD - HD today on schedule per nephrology team - HD cath removed by Dr. Henreitta Leber on 12/18/22 -  plan for cath holiday now, then replace cath around 10/14   History of MM - treated by Dr. Ellin Saba outpatient   History of breast CA  - stable on anastrozole  Macrocytic Anemia  - B12, folic acid within normal limits  DVT prophylaxis:  SCD Code Status: DNR  Family Communication:  Disposition: anticipate return to prior senior living    Consultants:  nephrology Procedures:  Hemodialysis 10/11 HD catheter removal 10/11  Antimicrobials:  Cefepime/vancomycin 10/1   Subjective: Pt reports no specific complaints.  Objective: Vitals:   12/19/22 0433 12/19/22 1351 12/19/22 2042 12/20/22 0500  BP: 139/79 (!) 140/77 (!) 142/71 (!) 156/79  Pulse: 74 84 84 74  Resp: (!) 22     Temp: 98 F (36.7 C) 98.7 F (37.1 C) 98.5 F (36.9 C) (!) 97.5 F (36.4 C)  TempSrc:  Oral Oral Oral  SpO2: 95% 100% 95% 98%  Weight:      Height:        Intake/Output Summary (Last 24 hours) at 12/20/2022 1107 Last data filed at 12/19/2022 2130 Gross per 24 hour  Intake 540 ml  Output --  Net 540 ml   Filed Weights   12/16/22 2259 12/18/22 1035 12/18/22 1430  Weight: 62.1 kg 64.4 kg 63 kg   Examination:  General exam: chronically ill appearing female, awake, alert, cooperative, Appears calm and comfortable  Respiratory system:  Respiratory effort normal. Cardiovascular system: normal S1 & S2 heard. No JVD, murmurs, rubs, gallops or clicks. No pedal edema. Gastrointestinal system: Abdomen is nondistended, soft and nontender. No organomegaly or masses felt. Normal bowel sounds heard. Central nervous system: Alert and oriented but seems confused at times. No focal neurological deficits. Extremities: Symmetric 5 x 5 power. Skin: No rashes, lesions or ulcers. Psychiatry: Judgement and insight UTD.  Mood & affect appropriate.   Data Reviewed: I

## 2022-12-21 ENCOUNTER — Ambulatory Visit (HOSPITAL_COMMUNITY)
Admission: RE | Admit: 2022-12-21 | Discharge: 2022-12-21 | Disposition: A | Discharge status: Home / Self Care | Payer: Medicare PPO | Source: Ambulatory Visit | Attending: Nephrology | Admitting: Nephrology

## 2022-12-21 DIAGNOSIS — I120 Hypertensive chronic kidney disease with stage 5 chronic kidney disease or end stage renal disease: Secondary | ICD-10-CM | POA: Insufficient documentation

## 2022-12-21 DIAGNOSIS — E785 Hyperlipidemia, unspecified: Secondary | ICD-10-CM | POA: Insufficient documentation

## 2022-12-21 DIAGNOSIS — R7881 Bacteremia: Secondary | ICD-10-CM | POA: Diagnosis not present

## 2022-12-21 DIAGNOSIS — F32A Depression, unspecified: Secondary | ICD-10-CM | POA: Insufficient documentation

## 2022-12-21 DIAGNOSIS — T829XXA Unspecified complication of cardiac and vascular prosthetic device, implant and graft, initial encounter: Secondary | ICD-10-CM | POA: Diagnosis not present

## 2022-12-21 DIAGNOSIS — K219 Gastro-esophageal reflux disease without esophagitis: Secondary | ICD-10-CM | POA: Insufficient documentation

## 2022-12-21 DIAGNOSIS — E1122 Type 2 diabetes mellitus with diabetic chronic kidney disease: Secondary | ICD-10-CM | POA: Insufficient documentation

## 2022-12-21 DIAGNOSIS — N186 End stage renal disease: Secondary | ICD-10-CM | POA: Insufficient documentation

## 2022-12-21 DIAGNOSIS — D631 Anemia in chronic kidney disease: Secondary | ICD-10-CM | POA: Diagnosis not present

## 2022-12-21 DIAGNOSIS — Z992 Dependence on renal dialysis: Secondary | ICD-10-CM | POA: Insufficient documentation

## 2022-12-21 HISTORY — PX: IR FLUORO GUIDE CV LINE LEFT: IMG2282

## 2022-12-21 HISTORY — PX: IR US GUIDE VASC ACCESS LEFT: IMG2389

## 2022-12-21 LAB — RENAL FUNCTION PANEL
Albumin: 3.4 g/dL — ABNORMAL LOW (ref 3.5–5.0)
Anion gap: 19 — ABNORMAL HIGH (ref 5–15)
BUN: 66 mg/dL — ABNORMAL HIGH (ref 8–23)
CO2: 21 mmol/L — ABNORMAL LOW (ref 22–32)
Calcium: 9 mg/dL (ref 8.9–10.3)
Chloride: 97 mmol/L — ABNORMAL LOW (ref 98–111)
Creatinine, Ser: 9.97 mg/dL — ABNORMAL HIGH (ref 0.44–1.00)
GFR, Estimated: 4 mL/min — ABNORMAL LOW (ref >60)
Glucose, Bld: 74 mg/dL (ref 70–99)
Phosphorus: 10 mg/dL — ABNORMAL HIGH (ref 2.5–4.6)
Potassium: 4.6 mmol/L (ref 3.5–5.1)
Sodium: 137 mmol/L (ref 135–145)

## 2022-12-21 LAB — CBC WITH DIFFERENTIAL/PLATELET
Abs Immature Granulocytes: 0.01 10*3/uL (ref 0.00–0.07)
Basophils Absolute: 0 10*3/uL (ref 0.0–0.1)
Basophils Relative: 1 %
Eosinophils Absolute: 0.3 10*3/uL (ref 0.0–0.5)
Eosinophils Relative: 6 %
HCT: 32.5 % — ABNORMAL LOW (ref 36.0–46.0)
Hemoglobin: 10.1 g/dL — ABNORMAL LOW (ref 12.0–15.0)
Immature Granulocytes: 0 %
Lymphocytes Relative: 21 %
Lymphs Abs: 1 10*3/uL (ref 0.7–4.0)
MCH: 31.8 pg (ref 26.0–34.0)
MCHC: 31.1 g/dL (ref 30.0–36.0)
MCV: 102.2 fL — ABNORMAL HIGH (ref 80.0–100.0)
Monocytes Absolute: 0.5 10*3/uL (ref 0.1–1.0)
Monocytes Relative: 12 %
Neutro Abs: 2.8 10*3/uL (ref 1.7–7.7)
Neutrophils Relative %: 60 %
Platelets: 190 10*3/uL (ref 150–400)
RBC: 3.18 MIL/uL — ABNORMAL LOW (ref 3.87–5.11)
RDW: 15.1 % (ref 11.5–15.5)
WBC: 4.5 10*3/uL (ref 4.0–10.5)
nRBC: 0 % (ref 0.0–0.2)

## 2022-12-21 LAB — CULTURE, BLOOD (ROUTINE X 2): Culture: NO GROWTH

## 2022-12-21 MED ORDER — CEFAZOLIN SODIUM-DEXTROSE 2-4 GM/100ML-% IV SOLN
INTRAVENOUS | Status: AC | PRN
Start: 2022-12-21 — End: 2022-12-21
  Administered 2022-12-21: 2 g via INTRAVENOUS

## 2022-12-21 MED ORDER — FENTANYL CITRATE (PF) 100 MCG/2ML IJ SOLN
INTRAMUSCULAR | Status: AC | PRN
Start: 2022-12-21 — End: 2022-12-21
  Administered 2022-12-21: 25 ug via INTRAVENOUS

## 2022-12-21 MED ORDER — MIDAZOLAM HCL 2 MG/2ML IJ SOLN
INTRAMUSCULAR | Status: AC
  Filled 2022-12-21: qty 2

## 2022-12-21 MED ORDER — HEPARIN SODIUM (PORCINE) 1000 UNIT/ML IJ SOLN
INTRAMUSCULAR | Status: AC
  Filled 2022-12-21: qty 10

## 2022-12-21 MED ORDER — MIDAZOLAM HCL 2 MG/2ML IJ SOLN
INTRAMUSCULAR | Status: AC | PRN
Start: 2022-12-21 — End: 2022-12-21
  Administered 2022-12-21: 1 mg via INTRAVENOUS

## 2022-12-21 MED ORDER — LIDOCAINE-EPINEPHRINE 1 %-1:100000 IJ SOLN
20.0000 mL | Freq: Once | INTRAMUSCULAR | Status: AC
  Administered 2022-12-21: 15 mL via INTRADERMAL

## 2022-12-21 MED ORDER — LIDOCAINE-EPINEPHRINE 1 %-1:100000 IJ SOLN
INTRAMUSCULAR | Status: AC
  Filled 2022-12-21: qty 1

## 2022-12-21 MED ORDER — CEFAZOLIN SODIUM-DEXTROSE 2-4 GM/100ML-% IV SOLN
INTRAVENOUS | Status: AC
  Filled 2022-12-21: qty 100

## 2022-12-21 MED ORDER — FENTANYL CITRATE (PF) 100 MCG/2ML IJ SOLN
INTRAMUSCULAR | Status: AC
  Filled 2022-12-21: qty 2

## 2022-12-21 NOTE — Progress Notes (Signed)
Physical Therapy Treatment Patient Details Name: Carly Wood MRN: [id number] DOB: 1935-12-04 Today's Date: 12/21/2022   History of Present Illness Carly Wood  is a 87 y.o. female,  with medical history significant for ESRD, hypertension, diabetes mellitus, dementia, multiple myeloma.  Patient presents to ED secondary to complaints of fever, chills, altered mental status at HD center, patient reports her arm feels heavy, she reports some headache, overall she is a poor historian, she denies any chest pain, shortness of breath, does not make urine, otherwise she cannot give any specific complaints.  Patient with known history of multiple myeloma, she is on Velcade infusion biweekly.    PT Comments  Patient demonstrates slow labored movement for sitting up at bedside with most difficulty scooting to EOB, increased BLE strength for standing using RW and able to take a few side steps before having to sit due to c/o fatigue and fair/poor standing balance.  Patient tolerated sitting up in chair after therapy.  Patient will benefit from continued skilled physical therapy in hospital and recommended venue below to increase strength, balance, endurance for safe ADLs and gait.       If plan is discharge home, recommend the following: A lot of help with bathing/dressing/bathroom;A lot of help with walking and/or transfers;Help with stairs or ramp for entrance;Assistance with cooking/housework   Can travel by private vehicle     No  Equipment Recommendations  None recommended by PT    Recommendations for Other Services       Precautions / Restrictions Precautions Precautions: Fall Restrictions Weight Bearing Restrictions: No     Mobility  Bed Mobility Overal bed mobility: Needs Assistance Bed Mobility: Supine to Sit     Supine to sit: Mod assist, HOB elevated     General bed mobility comments: increased time, most difficulty scooting to EOB    Transfers Overall transfer level:  Needs assistance Equipment used: Rolling walker (2 wheels) Transfers: Sit to/from Stand, Bed to chair/wheelchair/BSC Sit to Stand: Min assist, Mod assist   Step pivot transfers: Min assist, Mod assist       General transfer comment: slow labored movement, increased BLE strength for standing using RW    Ambulation/Gait Ambulation/Gait assistance: Mod assist Gait Distance (Feet): 4 Feet Assistive device: Rolling walker (2 wheels) Gait Pattern/deviations: Decreased step length - right, Decreased step length - left, Decreased stride length Gait velocity: slow     General Gait Details: limited to a few slow labored side steps due to do c/o weakness   Stairs             Wheelchair Mobility     Tilt Bed    Modified Rankin (Stroke Patients Only)       Balance Overall balance assessment: Needs assistance Sitting-balance support: Feet supported, No upper extremity supported Sitting balance-Leahy Scale: Fair Sitting balance - Comments: fair/good seated at EOB   Standing balance support: Reliant on assistive device for balance, During functional activity, Bilateral upper extremity supported Standing balance-Leahy Scale: Poor Standing balance comment: fair/poor using RW                            Cognition Arousal: Alert Behavior During Therapy: WFL for tasks assessed/performed Overall Cognitive Status: History of cognitive impairments - at baseline  Exercises General Exercises - Lower Extremity Ankle Circles/Pumps: Seated, AROM, Strengthening, Both, 10 reps Long Arc Quad: Seated, AROM, Strengthening, Both, 10 reps Hip Flexion/Marching: Seated, AROM, Strengthening, Both, 10 reps    General Comments        Pertinent Vitals/Pain Pain Assessment Pain Assessment: Faces Faces Pain Scale: Hurts a little bit Pain Location: feet Pain Descriptors / Indicators: Sore Pain Intervention(s): Limited  activity within patient's tolerance, Monitored during session, Repositioned    Home Living                          Prior Function            PT Goals (current goals can now be found in the care plan section) Acute Rehab PT Goals Patient Stated Goal: return home with SNF staff to assist PT Goal Formulation: With patient Time For Goal Achievement: 12/31/22 Potential to Achieve Goals: Good Progress towards PT goals: Progressing toward goals    Frequency    Min 3X/week      PT Plan      Co-evaluation              AM-PAC PT "6 Clicks" Mobility   Outcome Measure  Help needed turning from your back to your side while in a flat bed without using bedrails?: A Little Help needed moving from lying on your back to sitting on the side of a flat bed without using bedrails?: A Lot Help needed moving to and from a bed to a chair (including a wheelchair)?: A Lot Help needed standing up from a chair using your arms (e.g., wheelchair or bedside chair)?: A Lot Help needed to walk in hospital room?: A Lot Help needed climbing 3-5 steps with a railing? : Total 6 Click Score: 12    End of Session   Activity Tolerance: Patient tolerated treatment well;Patient limited by fatigue Patient left: in chair;with call bell/phone within reach;with chair alarm set Nurse Communication: Mobility status PT Visit Diagnosis: Unsteadiness on feet (R26.81);Other abnormalities of gait and mobility (R26.89);Muscle weakness (generalized) (M62.81)     Time: 0950-1010 PT Time Calculation (min) (ACUTE ONLY): 20 min  Charges:    $Therapeutic Exercise: 8-22 mins $Therapeutic Activity: 8-22 mins PT General Charges $$ ACUTE PT VISIT: 1 Visit                     10:19 AM, 12/21/22 Ocie Bob, MPT Physical Therapist with [facility] [phone] office [phone] mobile phone

## 2022-12-21 NOTE — Plan of Care (Signed)
  Problem: Nutrition: Goal: Adequate nutrition will be maintained Outcome: Progressing   

## 2022-12-21 NOTE — Consult Note (Addendum)
Chief Complaint: Patient was seen in consultation today for end stage renal disease  Referring Physician(s): Sabra Heck, MD  Supervising Physician: Irish Lack  Patient Status: [facility] inpatient, procedure at [facility] with transport back to [facility] afterwards  History of Present Illness: Carly Wood is a 87 y.o. female with a past medical history significant for depression, anxiety, GERD, DM, HTN, HLD, anemia, multiple myeloma and ESRD on HD seen today for tunneled HD catheter placement. Ms. Holzer presented to the ED 12/16/22 with complaints of fever, chills and confusion during HD. In the ED she was found to have a temperature of 101.4, tachypnea, tachycardia. She was admitted for sepsis and her tunneled left internal jugular HD catheter was removed by general surgery 12/18/22 for line holiday. She presents to [facility] IR today from [facility] for replacement of tunneled HD catheter for ongoing HD needs.   Patient seen in IR holding bay, she perseverates on the length of time it has taken her procedure to be done today and that she is cold - difficult to obtain much other history. She denies any concerns and states she is agreeable to proceed.  Past Medical History:  Diagnosis Date   Anemia     chronic macrocytic anemia   Anxiety    Chronic kidney disease    Chronic renal disease, stage 4, severely decreased glomerular filtration rate (GFR) between 15-29 mL/min/1.73 square meter (HCC) 08/22/2015   Complication of anesthesia    delirious after Breast Surgery   Dementia (HCC)    mild   Depression    Diabetes mellitus with ESRD (end-stage renal disease) (HCC)    type II   Dysphagia    Dyspnea    with activity   GERD (gastroesophageal reflux disease)    Glaucoma    Hyperlipidemia    Hypertension    Multiple myeloma (HCC)    Pneumonia    Stage 1 infiltrating ductal carcinoma of right female breast (HCC) 08/21/2015   ER+ PR+ HER 2 neu + (3+) T1cN0     Past Surgical History:  Procedure  Laterality Date   A/V FISTULAGRAM Right 07/05/2020   Procedure: A/V Fistulagram;  Surgeon: Sherren Kerns, MD;  [facility];  Service: Cardiovascular;  Laterality: Right;   AV FISTULA PLACEMENT Left 11/22/2017   Procedure: ARTERIOVENOUS (AV) FISTULA CREATION LEFT ARM;  Surgeon: Sherren Kerns, MD;  Location: [facility] OR;  Service: Vascular;  Laterality: Left;   AV FISTULA PLACEMENT Right 04/04/2020   Procedure: RIGHT ARM ARTERIOVENOUS FISTULA CREATION;  Surgeon: Larina Earthly, MD;  Location: [facility] ORS;  Service: Vascular;  Laterality: Right;   AV FISTULA PLACEMENT Right 08/20/2020   Procedure: ARTERIOVENOUS (AV) FISTULA LIGATION RIGHT ARM;  Surgeon: Sherren Kerns, MD;  Location: [facility] OR;  Service: Vascular;  Laterality: Right;   BIOPSY  08/07/2016   Procedure: BIOPSY;  Surgeon: Corbin Ade, MD;  Location: [facility]O SUITE;  Service: Endoscopy;;  gastric ulcer biopsy   COLONOSCOPY     ESOPHAGOGASTRODUODENOSCOPY N/A 08/07/2016   LA Grade A esophagitis s/p dilation, small hiatal hernia, multiple gastric ulcers and erosions, duodenal erosions s/p biopsy. Negative H.pylori    ESOPHAGOGASTRODUODENOSCOPY N/A 11/27/2016   normal esophagus, previously noted gastric ulcers completely healed, normal duodenum.    ESOPHAGOGASTRODUODENOSCOPY (EGD) WITH PROPOFOL N/A 07/23/2020   Procedure: ESOPHAGOGASTRODUODENOSCOPY (EGD) WITH PROPOFOL;  Surgeon: Malissa Hippo, MD;  Location[facility];  Service: Endoscopy;  Laterality: N/A;   FISTULA SUPERFICIALIZATION Left 02/14/2018  Procedure: FISTULA SUPERFICIALIZATION LEFT ARM;  Surgeon: Chuck Hint, MD;  Location: [facility] OR;  Service: Vascular;  Laterality: Left;   FRACTURE SURGERY Right    ankle   HOT HEMOSTASIS  07/23/2020   Procedure: HOT HEMOSTASIS (ARGON PLASMA COAGULATION/BICAP);  Surgeon: Malissa Hippo, MD;  [facility];  Service: Endoscopy;;   INTRAMEDULLARY (IM) NAIL INTERTROCHANTERIC Right 07/12/2020    Procedure: INTRAMEDULLARY (IM) NAIL INTERTROCHANTRIC;  Surgeon: Oliver Barre, MD;  Location: [facility] ORS;  Service: Orthopedics;  Laterality: Right;   MALONEY DILATION N/A 08/07/2016   Procedure: Elease Hashimoto DILATION;  Surgeon: Corbin Ade, MD;  [facility];  Service: Endoscopy;  Laterality: N/A;   MASTECTOMY, PARTIAL Right    PERIPHERAL VASCULAR BALLOON ANGIOPLASTY Left 07/13/2019   Procedure: PERIPHERAL VASCULAR BALLOON ANGIOPLASTY;  Surgeon: Cephus Shelling, MD;  [facility];  Service: Cardiovascular;  Laterality: Left;  arm fistulogram   PERIPHERAL VASCULAR BALLOON ANGIOPLASTY Right 05/22/2020   Procedure: PERIPHERAL VASCULAR BALLOON ANGIOPLASTY;  Surgeon: Leonie Douglas, MD;  [facility];  Service: Cardiovascular;  Laterality: Right;  arm fistula   PERIPHERAL VASCULAR BALLOON ANGIOPLASTY Right 07/05/2020   Procedure: PERIPHERAL VASCULAR BALLOON ANGIOPLASTY;  Surgeon: Sherren Kerns, MD;  [facility];  Service: Cardiovascular;  Laterality: Right;  arm fistula   PORT-A-CATH REMOVAL Left 11/22/2017   Procedure: REMOVAL PORT-A-CATH LEFT CHEST;  Surgeon: Sherren Kerns, MD;  Location: [facility] OR;  Service: Vascular;  Laterality: Left;   RETINAL DETACHMENT SURGERY Right    SCLEROTHERAPY  07/23/2020   Procedure: SCLEROTHERAPY;  Surgeon: Malissa Hippo, MD;  [facility];  Service: Endoscopy;;   UPPER EXTREMITY VENOGRAPHY N/A 08/22/2020   Procedure: LEFT UPPER & CENTRAL VENOGRAPHY;  Surgeon: Cephus Shelling, MD;  [facility];  Service: Cardiovascular;  Laterality: N/A;    Allergies: Ace inhibitors, Angiotensin receptor blockers, and Penicillins  Medications: Prior to Admission medications   Medication Sig Start Date End Date Taking? Authorizing Provider  acetaminophen (TYLENOL) 325 MG tablet Take 650 mg by mouth 3 (three) times daily.   Yes [provider]  acyclovir (ZOVIRAX) 200 MG  capsule Take 200 mg by mouth in the morning. (0800) 10/20/19  Yes [provider]  anastrozole (ARIMIDEX) 1 MG tablet TAKE 1 TABLET BY MOUTH DAILY 11/05/20  Yes [name], MD  Calcium Carb-Cholecalciferol (CALCIUM + VITAMIN D3) 500-5 MG-MCG TABS Take 1 tablet by mouth daily.   Yes [provider]  melatonin 5 MG TABS Take 5 mg by mouth at bedtime.   Yes [provider]  multivitamin (RENA-VIT) TABS tablet Take 1 tablet by mouth at bedtime.   Yes [provider]  Nutritional Supplements (ENSURE ENLIVE PO) Take 237 mLs by mouth every evening.   Yes [provider]  ondansetron (ZOFRAN-ODT) 4 MG disintegrating tablet Take 4 mg by mouth daily before breakfast. 01/01/22  Yes [provider]  pantoprazole (PROTONIX) 40 MG tablet Take 40 mg by mouth 2 (two) times daily.   Yes [provider]  sennosides-docusate sodium (SENOKOT-S) 8.6-50 MG tablet Take 1 tablet by mouth 2 (two) times daily.   Yes [provider]  sertraline (ZOLOFT) 25 MG tablet Take 25 mg by mouth daily. 11/01/22  Yes [provider]  sucralfate (CARAFATE) 1 GM/10ML suspension Take 1 g by mouth in the morning, at noon, in the evening, and at bedtime.   Yes [provider]  Family History  Problem Relation Age of Onset   Multiple myeloma Sister    Brain cancer Sister    Dementia Mother        died at 82   Stroke Mother    Heart failure Mother    Diabetes Mother    Heart disease Father    Prostate cancer Brother    Colon cancer Neg Hx     Social History   Socioeconomic History   Marital status: Single    Spouse name: Not on file   Number of children: Not on file   Years of education: Not on file   Highest education level: Not on file  Occupational History   Occupation: retired   Tobacco Use   Smoking status: Never   Smokeless tobacco: Never  Vaping Use   Vaping status: Never Used  Substance and Sexual Activity    Alcohol use: No    Alcohol/week: 0.0 standard drinks of alcohol   Drug use: No   Sexual activity: Never  Other Topics Concern   Not on file  Social History Narrative   Long term resident of SNF    Social Determinants of Health   Financial Resource Strain: Low Risk  (01/09/2020)   Overall Financial Resource Strain (CARDIA)    Difficulty of Paying Living Expenses: Not very hard  Food Insecurity: No Food Insecurity (12/16/2022)   Hunger Vital Sign    Worried About Running Out of Food in the Last Year: Never true    Ran Out of Food in the Last Year: Never true  Transportation Needs: No Transportation Needs (12/16/2022)   PRAPARE - Administrator, Civil Service (Medical): No    Lack of Transportation (Non-Medical): No  Physical Activity: Inactive (01/09/2020)   Exercise Vital Sign    Days of Exercise per Week: 0 days    Minutes of Exercise per Session: 0 min  Stress: No Stress Concern Present (01/09/2020)   [employer] of Occupational Health - Occupational Stress Questionnaire    Feeling of Stress : Not at all  Social Connections: Moderately Isolated (01/09/2020)   Social Connection and Isolation Panel [NHANES]    Frequency of Communication with Friends and Family: More than three times a week    Frequency of Social Gatherings with Friends and Family: Once a week    Attends Religious Services: More than 4 times per year    Active Member of [employer] or Organizations: No    Attends Banker Meetings: Never    Marital Status: Never married     Review of Systems: A 12 point ROS discussed and pertinent positives are indicated in the HPI above.  All other systems are negative.  Review of Systems  Constitutional:  Negative for fever.  Respiratory:  Negative for cough.   Cardiovascular:  Negative for chest pain.  Gastrointestinal:  Negative for abdominal pain.    Vital Signs: BP (!) 166/89 (BP Location: Right Wrist)   Pulse 76   Temp 98.1 F (36.7 C) (Oral)    Resp (!) 22   Ht 5\' 3"  (1.6 m)   Wt 138 lb 14.2 oz (63 kg)   SpO2 100%   BMI 24.60 kg/m   Physical Exam Vitals and nursing note reviewed.  Constitutional:      General: She is not in acute distress. HENT:     Head: Normocephalic.     Mouth/Throat:     Mouth: Mucous membranes are moist.     Pharynx: Oropharynx  is clear. No oropharyngeal exudate or posterior oropharyngeal erythema.  Cardiovascular:     Rate and Rhythm: Normal rate and regular rhythm.     Comments: (+) dressing over previous left HD cath site Pulmonary:     Effort: Pulmonary effort is normal.     Breath sounds: Normal breath sounds.  Abdominal:     Palpations: Abdomen is soft.  Skin:    General: Skin is warm and dry.  Neurological:     Mental Status: She is alert and oriented to person, place, and time.  Psychiatric:        Mood and Affect: Mood normal.        Behavior: Behavior normal.        Thought Content: Thought content normal.        Judgment: Judgment normal.      MD Evaluation Airway: WNL Heart: WNL Abdomen: WNL Chest/ Lungs: WNL ASA  Classification: 3 Mallampati/Airway Score: Two   Imaging: ECHOCARDIOGRAM COMPLETE  Result Date: 12/19/2022    ECHOCARDIOGRAM REPORT   Patient Name:   KAPREE MIDDLESWORTH Date of Exam: 12/18/2022 Medical Rec #:  [id number]      Height:       63.0 in Accession #:    [id number]     Weight:       138.9 lb Date of Birth:  14-Dec-1935     BSA:          1.656 m Patient Age:    86 years       BP:           142/70 mmHg Patient Gender: F              HR:           107 bpm. Exam Location:  Inpatient Procedure: 2D Echo, Cardiac Doppler and Color Doppler Indications:    Bacteremia R78.81  History:        Patient has prior history of Echocardiogram examinations, most                 recent 09/28/2021. Signs/Symptoms:Syncope; Risk                 Factors:Hypertension, Diabetes and Dyslipidemia. ESRD, Breast                 cancer (Right) breast.  Sonographer:    Lucendia Herrlich  Referring Phys: [id number] GOKUL KRISHNAN IMPRESSIONS  1. Intracavitary gradient of 98 mmHg (vel 4.9 m/s) due to hyperdynamic function.. Left ventricular ejection fraction, by estimation, is >75%. The left ventricle has hyperdynamic function. The left ventricle has no regional wall motion abnormalities. There is moderate asymmetric left ventricular hypertrophy of the septal segment. Left ventricular diastolic parameters are indeterminate.  2. Right ventricular systolic function is normal. The right ventricular size is normal. Tricuspid regurgitation signal is inadequate for assessing PA pressure.  3. Chordal SAM. The mitral valve is degenerative. Trivial mitral valve regurgitation. No evidence of mitral stenosis. The mean mitral valve gradient is 4.0 mmHg with average heart rate of 105 bpm.  4. The aortic valve is abnormal. There is mild calcification of the aortic valve. Aortic valve regurgitation is not visualized. Mild to moderate aortic valve stenosis. Aortic valve area, by VTI measures 1.37 cm. Aortic valve mean gradient measures 17.0  mmHg. Aortic valve Vmax measures 2.84 m/s.  5. The inferior vena cava is normal in size with greater than 50% respiratory variability, suggesting right atrial pressure of 3 mmHg. FINDINGS  Left Ventricle:  Intracavitary gradient of 98 mmHg (vel 4.9 m/s) due to hyperdynamic function. Left ventricular ejection fraction, by estimation, is >75%. The left ventricle has hyperdynamic function. The left ventricle has no regional wall motion abnormalities. The left ventricular internal cavity size was small. There is moderate asymmetric left ventricular hypertrophy of the septal segment. Left ventricular diastolic parameters are indeterminate. Right Ventricle: The right ventricular size is normal. No increase in right ventricular wall thickness. Right ventricular systolic function is normal. Tricuspid regurgitation signal is inadequate for assessing PA pressure. The tricuspid regurgitant  velocity is 1.71 m/s, and with an assumed right atrial pressure of 3 mmHg, the estimated right ventricular systolic pressure is 14.7 mmHg. Left Atrium: Left atrial size was normal in size. Right Atrium: Right atrial size was normal in size. Pericardium: Trivial pericardial effusion is present. Mitral Valve: Chordal SAM. The mitral valve is degenerative in appearance. Trivial mitral valve regurgitation. No evidence of mitral valve stenosis. MV peak gradient, 12.0 mmHg. The mean mitral valve gradient is 4.0 mmHg with average heart rate of 105 bpm. Tricuspid Valve: The tricuspid valve is normal in structure. Tricuspid valve regurgitation is trivial. No evidence of tricuspid stenosis. Aortic Valve: The aortic valve is abnormal. There is mild calcification of the aortic valve. Aortic valve regurgitation is not visualized. Mild to moderate aortic stenosis is present. Aortic valve mean gradient measures 17.0 mmHg. Aortic valve peak gradient measures 32.3 mmHg. Aortic valve area, by VTI measures 1.37 cm. Pulmonic Valve: The pulmonic valve was normal in structure. Pulmonic valve regurgitation is not visualized. No evidence of pulmonic stenosis. Aorta: The aortic root is normal in size and structure. Venous: The inferior vena cava is normal in size with greater than 50% respiratory variability, suggesting right atrial pressure of 3 mmHg. IAS/Shunts: No atrial level shunt detected by color flow Doppler.  LEFT VENTRICLE PLAX 2D LVIDd:         3.10 cm   Diastology LVIDs:         2.00 cm   LV e' medial:    4.90 cm/s LV PW:         1.00 cm   LV E/e' medial:  11.5 LV IVS:        1.40 cm   LV e' lateral:   6.85 cm/s LVOT diam:     2.00 cm   LV E/e' lateral: 8.2 LV SV:         53 LV SV Index:   32 LVOT Area:     3.14 cm  RIGHT VENTRICLE             IVC RV S prime:     19.40 cm/s  IVC diam: 1.80 cm TAPSE (M-mode): 1.6 cm LEFT ATRIUM             Index        RIGHT ATRIUM          Index LA diam:        3.40 cm 2.05 cm/m   RA Area:      6.53 cm LA Vol (A2C):   35.6 ml 21.50 ml/m  RA Volume:   9.40 ml  5.68 ml/m LA Vol (A4C):   41.6 ml 25.12 ml/m LA Biplane Vol: 42.5 ml 25.66 ml/m  AORTIC VALVE AV Area (Vmax):    1.34 cm AV Area (Vmean):   1.33 cm AV Area (VTI):     1.37 cm AV Vmax:           284.00 cm/s AV  Vmean:          178.200 cm/s AV VTI:            0.387 m AV Peak Grad:      32.3 mmHg AV Mean Grad:      17.0 mmHg LVOT Vmax:         121.00 cm/s LVOT Vmean:        75.300 cm/s LVOT VTI:          0.168 m LVOT/AV VTI ratio: 0.44  AORTA Ao Root diam: 3.00 cm Ao Asc diam:  2.90 cm MITRAL VALVE                TRICUSPID VALVE MV Area (PHT): 2.95 cm     TR Peak grad:   11.7 mmHg MV Area VTI:   1.84 cm     TR Vmax:        171.00 cm/s MV Peak grad:  12.0 mmHg MV Mean grad:  4.0 mmHg     SHUNTS MV Vmax:       1.74 m/s     Systemic VTI:  0.17 m MV Vmean:      96.4 cm/s    Systemic Diam: 2.00 cm MV Decel Time: 257 msec MR Peak grad: 74.3 mmHg MR Vmax:      431.00 cm/s MV E velocity: 56.20 cm/s MV A velocity: 133.00 cm/s MV E/A ratio:  0.42 Weston Brass MD Electronically signed by Weston Brass MD Signature Date/Time: 12/19/2022/2:18:52 PM    Final    DG Chest Port 1 View  Result Date: 12/18/2022 CLINICAL DATA:  Removal of vascular catheter EXAM: PORTABLE CHEST 1 VIEW COMPARISON:  12/16/2022 FINDINGS: Interim removal of left-sided vascular catheter. No acute airspace disease or pleural effusion. Stable cardiomediastinal silhouette with aortic atherosclerosis. No pneumothorax. Coarse oval calcification projecting over left breast soft tissues. Skin fold artifact right chest. IMPRESSION: Interim removal of left-sided vascular catheter. Lung fields are clear aside from minimal atelectasis or scarring in the left lower lung Electronically Signed   By: Jasmine Pang M.D.   On: 12/18/2022 16:55   DG Chest Port 1 View  Result Date: 12/16/2022 CLINICAL DATA:  Cough, altered mental status. EXAM: PORTABLE CHEST 1 VIEW COMPARISON:  10/01/2021.  FINDINGS: The heart is enlarged and the mediastinal contour is within normal limits. Atherosclerotic calcification of the aorta is noted. Lung volumes are low. No consolidation, effusion, or pneumothorax. A left internal jugular central venous catheter appear stable. Surgical clips are present in the right axilla. No acute osseous abnormality. IMPRESSION: No active disease. Electronically Signed   By: Thornell Sartorius M.D.   On: 12/16/2022 20:29    Labs:  CBC: Recent Labs    12/18/22 0429 12/19/22 0456 12/20/22 0526 12/21/22 0339  WBC 4.2 4.1 4.0 4.5  HGB 10.0* 10.6* 10.2* 10.1*  HCT 30.4* 33.6* 33.4* 32.5*  PLT 150 167 162 190    COAGS: Recent Labs    12/16/22 1807  INR 1.1  APTT 29    BMP: Recent Labs    12/18/22 1118 12/19/22 0456 12/20/22 0526 12/21/22 0339  NA 131* 137 136 137  K 4.9 4.1 4.4 4.6  CL 92* 97* 97* 97*  CO2 24 26 24  21*  GLUCOSE 149* 83 80 74  BUN 60* 34* 53* 66*  CALCIUM 8.5* 9.4 9.0 9.0  CREATININE 8.82* 5.75* 8.00* 9.97*  GFRNONAA 4* 7* 5* 4*    LIVER FUNCTION TESTS: Recent Labs    10/27/22 1322 11/10/22 1324  11/24/22 1321 12/16/22 1807 12/18/22 1118 12/19/22 0456 12/20/22 0526 12/21/22 0339  BILITOT 0.4 0.5 0.4 0.6  --   --   --   --   AST 16 18 16 18   --   --   --   --   ALT 12 12 12 14   --   --   --   --   ALKPHOS 54 57 65 80  --   --   --   --   PROT 6.8 6.8 6.9 7.9  --   --   --   --   ALBUMIN 3.7 3.6 3.6 4.1 3.2* 3.4* 3.3* 3.4*    TUMOR MARKERS: No results for input(s): "AFPTM", "CEA", "CA199", "CHROMGRNA" in the last 8760 hours.  Assessment and Plan:  87 y/o F with history for ESRD on HD admitted to [facility] 12/16/22 with sepsis. Left internal jugular tunneled HD catheter was removed by general surgery at [facility] on 10/11. IR has been consulted for replacement of tunneled HD catheter at [facility].  Patient has been afebrile for > 48 hours, WBC 4.5, repeat blood cultures from 10/10 shows no growth to date. Plan to proceed with tunneled HD  catheter replacement.  Risks and benefits discussed with the patient including, but not limited to bleeding, infection, vascular injury, pneumothorax which may require chest tube placement, air embolism or even death.  All of the patient's questions were answered, patient is agreeable to proceed.  Consent signed and in chart.  Thank you for this interesting consult.  I greatly enjoyed meeting Carly Wood and look forward to participating in their care.  A copy of this report was sent to the requesting provider on this date.  Electronically Signed: Villa Herb, PA-C 12/21/2022, 12:43 PM   I spent a total of 40 Minutes in face to face in clinical consultation, greater than 50% of which was counseling/coordinating care for ESRD.

## 2022-12-21 NOTE — Progress Notes (Signed)
Patient ID: Carly Wood, female   DOB: Aug 24, 1935, 87 y.o.   MRN: [id number] S: no events over the weekend.   O:BP (!) 155/75 (BP Location: Right Arm)   Pulse 76   Temp 98.5 F (36.9 C) (Oral)   Resp (!) 22   Ht 5\' 3"  (1.6 m)   Wt 63 kg   SpO2 98%   BMI 24.60 kg/m   Intake/Output Summary (Last 24 hours) at 12/21/2022 0848 Last data filed at 12/20/2022 1700 Gross per 24 hour  Intake 480 ml  Output --  Net 480 ml   Intake/Output: I/O last 3 completed shifts: In: 580 [P.O.:580] Out: -   Intake/Output this shift:  No intake/output data recorded. Weight change:  Gen: NAD CVS: RRR Resp: bilateral rhonchi Abd: +BS, soft, NT/ND Ext: trace pretibial edema  Recent Labs  Lab 12/16/22 1807 12/17/22 0434 12/18/22 1118 12/19/22 0456 12/20/22 0526 12/21/22 0339  NA 133* 133* 131* 137 136 137  K 4.1 4.6 4.9 4.1 4.4 4.6  CL 93* 95* 92* 97* 97* 97*  CO2 24 24 24 26 24  21*  GLUCOSE 123* 72 149* 83 80 74  BUN 22 28* 60* 34* 53* 66*  CREATININE 4.81* 6.00* 8.82* 5.75* 8.00* 9.97*  ALBUMIN 4.1  --  3.2* 3.4* 3.3* 3.4*  CALCIUM 9.4 9.4 8.5* 9.4 9.0 9.0  PHOS  --   --  7.9* 6.1* 7.8* 10.0*  AST 18  --   --   --   --   --   ALT 14  --   --   --   --   --    Liver Function Tests: Recent Labs  Lab 12/16/22 1807 12/18/22 1118 12/19/22 0456 12/20/22 0526 12/21/22 0339  AST 18  --   --   --   --   ALT 14  --   --   --   --   ALKPHOS 80  --   --   --   --   BILITOT 0.6  --   --   --   --   PROT 7.9  --   --   --   --   ALBUMIN 4.1   < > 3.4* 3.3* 3.4*   < > = values in this interval not displayed.   Recent Labs  Lab 12/16/22 1807  LIPASE 82*   No results for input(s): "AMMONIA" in the last 168 hours. CBC: Recent Labs  Lab 12/17/22 0434 12/17/22 0530 12/18/22 0429 12/19/22 0456 12/20/22 0526 12/21/22 0339  WBC 4.5  --  4.2 4.1 4.0 4.5  NEUTROABS  --    < > 2.3 2.5 2.3 2.8  HGB 10.4*  --  10.0* 10.6* 10.2* 10.1*  HCT 32.3*  --  30.4* 33.6* 33.4* 32.5*  MCV  101.9*  --  101.3* 101.8* 103.1* 102.2*  PLT 129*  --  150 167 162 190   < > = values in this interval not displayed.   Cardiac Enzymes: No results for input(s): "CKTOTAL", "CKMB", "CKMBINDEX", "TROPONINI" in the last 168 hours. CBG: Recent Labs  Lab 12/19/22 1153 12/19/22 1641 12/19/22 2139 12/20/22 0731 12/20/22 1134  GLUCAP 86 95 146* 78 152*    Iron Studies: No results for input(s): "IRON", "TIBC", "TRANSFERRIN", "FERRITIN" in the last 72 hours. Studies/Results: No results found.  acyclovir  200 mg Oral q AM   anastrozole  1 mg Oral Daily   Chlorhexidine Gluconate Cloth  6 each Topical Daily   melatonin  6 mg Oral QHS    BMET    Component Value Date/Time   NA 137 12/21/2022 0339   K 4.6 12/21/2022 0339   CL 97 (L) 12/21/2022 0339   CO2 21 (L) 12/21/2022 0339   GLUCOSE 74 12/21/2022 0339   BUN 66 (H) 12/21/2022 0339   CREATININE 9.97 (H) 12/21/2022 0339   CALCIUM 9.0 12/21/2022 0339   CALCIUM 9.0 10/18/2018 1059   GFRNONAA 4 (L) 12/21/2022 0339   GFRAA 5 (L) 12/05/2019 1241   CBC    Component Value Date/Time   WBC 4.5 12/21/2022 0339   RBC 3.18 (L) 12/21/2022 0339   HGB 10.1 (L) 12/21/2022 0339   HCT 32.5 (L) 12/21/2022 0339   PLT 190 12/21/2022 0339   MCV 102.2 (H) 12/21/2022 0339   MCH 31.8 12/21/2022 0339   MCHC 31.1 12/21/2022 0339   RDW 15.1 12/21/2022 0339   LYMPHSABS 1.0 12/21/2022 0339   MONOABS 0.5 12/21/2022 0339   EOSABS 0.3 12/21/2022 0339   BASOSABS 0.0 12/21/2022 0339     Assessment/Plan:  Staphylococcus hominis bacteremia - presumably due to [facility] which was removed on 12/18/22 after HD.  Currently on vanco and cefepime.  Abx per primary svc  ESRD - on MWF schedule.  Last HD on 12/18/22 and [facility] TDC removed.  To have new TDC placed today by IR.  Will plan for HD after [facility] placement. Anemia - stable CKD-BMD - stable for now. AMS - improved to baseline.  Likely due to fever and infection. HTN/volume - stable Myeloma - currently on  Velcade per Hematology Dementia - stable Vascular access - as above.  Irena Cords, MD [facility]

## 2022-12-21 NOTE — Progress Notes (Signed)
Report given to carelink. Carelink denied having any other questions. Patient resting comfortably. All catheters intact. Patient denies any s/s of distress. None observed. VSS. Care packet given to patient. All belongings with patient.

## 2022-12-21 NOTE — Plan of Care (Signed)
  Problem: Clinical Measurements: Goal: Will remain free from infection Outcome: Progressing Goal: Diagnostic test results will improve Outcome: Progressing   Problem: Activity: Goal: Risk for activity intolerance will decrease Outcome: Progressing    Problem: Skin Integrity: Goal: Risk for impaired skin integrity will decrease Outcome: Progressing     Problem: Pain Managment: Goal: General experience of comfort will improve Outcome: Progressing

## 2022-12-21 NOTE — Progress Notes (Signed)
PROGRESS NOTE   Carly Wood  UEA:[id number] DOB: 1936/02/22 DOA: 12/16/2022 PCP: Sharee Holster, NP   Chief Complaint  Patient presents with   Altered Mental Status   Level of care: Telemetry  Brief Admission History:  87 y.o. female,  with medical history significant for ESRD, hypertension, diabetes mellitus, dementia, multiple myeloma.  Patient presents to ED secondary to complaints of fever, chills, altered mental status at HD center, patient reports her arm feels heavy, she reports some headache, overall she is a poor historian, she denies any chest pain, shortness of breath, does not make urine, otherwise she cannot give any specific complaints.  Patient with known history of multiple myeloma, she is on Velcade infusion biweekly. -In ED patient was noted to be febrile 101.4, tachypneic, tachycardic, chest x-ray with no acute findings, anuric unable to obtain any urine by in and out,Creatinine at 4.8, BUN at 22, she is having left upper chest HD catheter, blood cultures were sent, she was started on broad-spectrum antibiotics and [facility] hospitalist consulted to admit.   Assessment and Plan:  Staphylococcus hominis catheter infection  - suspect from infected dialysis catheter - discussed with Dr. Marisue Humble, removed catheter after HD 10/11 - Dr. Henreitta Leber removed catheter 10/11 - nephrology formulating a plan for new cath to be placed on Monday 10/14 - discussed with ID Dr. Thedore Mins, recommendation is 2 weeks of vancomycin IV with Hemodialysis  - 2D echocardiogram no report of vegetation - repeated blood cultures 12/17/22 No growth to date  ESRD on HD - HD today on schedule per nephrology team - HD cath removed by Dr. Henreitta Leber on 12/18/22 - completed cath holiday, replaced dialysis catheter by IR at [facility] on 10/14  - HD 10/14 after new HD catheter placed  - likely DC on 10/15   History of MM - treated by Dr. Ellin Saba outpatient   History of breast CA  - stable on  anastrozole  Macrocytic Anemia  - B12, folic acid within normal limits  DVT prophylaxis: SCD Code Status: DNR  Family Communication:  Disposition: anticipate return to prior senior living    Consultants:  Nephrology Surgery  Interventional radiology   Procedures:  Hemodialysis 10/11 HD catheter removal 10/11  Antimicrobials:  Cefepime 10/11-14 vancomycin 10/11>>  Subjective: Pt eager to have procedures done so she can eat.  No other complaints.   Objective: Vitals:   12/20/22 1314 12/20/22 2129 12/21/22 0450 12/21/22 1027  BP: (!) 137/91 (!) 157/76 (!) 155/75 (!) 166/89  Pulse: 81 72 76 76  Resp:      Temp: 98 F (36.7 C) 98.3 F (36.8 C) 98.5 F (36.9 C) 98.1 F (36.7 C)  TempSrc: Oral Oral Oral Oral  SpO2: 100% 97% 98% 100%  Weight:      Height:        Intake/Output Summary (Last 24 hours) at 12/21/2022 1531 Last data filed at 12/21/2022 4782 Gross per 24 hour  Intake 240 ml  Output --  Net 240 ml   Filed Weights   12/16/22 2259 12/18/22 1035 12/18/22 1430  Weight: 62.1 kg 64.4 kg 63 kg   Examination:  General exam: chronically ill appearing female, awake, alert, cooperative, Appears calm and comfortable  Respiratory system:  Respiratory effort normal. Cardiovascular system: normal S1 & S2 heard. No JVD, murmurs, rubs, gallops or clicks. No pedal edema. Gastrointestinal system: Abdomen is nondistended, soft and nontender. No organomegaly or masses felt. Normal bowel sounds heard. Central nervous system: Alert and oriented but seems confused  at times. No focal neurological deficits. Extremities: Symmetric 5 x 5 power. Skin: No rashes, lesions or ulcers. Psychiatry: Judgement and insight UTD.  Mood & affect appropriate.   Data Reviewed: I have personally reviewed following labs and imaging studies  CBC: Recent Labs  Lab 12/17/22 0434 12/17/22 0530 12/18/22 0429 12/19/22 0456 12/20/22 0526 12/21/22 0339  WBC 4.5  --  4.2 4.1 4.0 4.5   NEUTROABS  --  3.1 2.3 2.5 2.3 2.8  HGB 10.4*  --  10.0* 10.6* 10.2* 10.1*  HCT 32.3*  --  30.4* 33.6* 33.4* 32.5*  MCV 101.9*  --  101.3* 101.8* 103.1* 102.2*  PLT 129*  --  150 167 162 190    Basic Metabolic Panel: Recent Labs  Lab 12/17/22 0434 12/18/22 1118 12/19/22 0456 12/20/22 0526 12/21/22 0339  NA 133* 131* 137 136 137  K 4.6 4.9 4.1 4.4 4.6  CL 95* 92* 97* 97* 97*  CO2 24 24 26 24  21*  GLUCOSE 72 149* 83 80 74  BUN 28* 60* 34* 53* 66*  CREATININE 6.00* 8.82* 5.75* 8.00* 9.97*  CALCIUM 9.4 8.5* 9.4 9.0 9.0  PHOS  --  7.9* 6.1* 7.8* 10.0*    CBG: Recent Labs  Lab 12/19/22 1153 12/19/22 1641 12/19/22 2139 12/20/22 0731 12/20/22 1134  GLUCAP 86 95 146* 78 152*    Recent Results (from the past 240 hour(s))  Blood Culture (routine x 2)     Status: None   Collection Time: 12/16/22  6:07 PM   Specimen: BLOOD  Result Value Ref Range Status   Specimen Description BLOOD BLOOD RIGHT ARM  Final   Special Requests   Final    Blood Culture results may not be optimal due to an inadequate volume of blood received in culture bottles BOTTLES DRAWN AEROBIC AND ANAEROBIC   Culture   Final    NO GROWTH 5 DAYS Performed at [facility], [address] [place], [place] [address]    Report Status 12/21/2022 FINAL  Final  Blood Culture (routine x 2)     Status: Abnormal   Collection Time: 12/16/22  7:03 PM   Specimen: BLOOD  Result Value Ref Range Status   Specimen Description BLOOD BLOOD RIGHT ARM  Final   Special Requests   Final    BOTTLES DRAWN AEROBIC AND ANAEROBIC Blood Culture results may not be optimal due to an excessive volume of blood received in culture bottles   Culture  Setup Time   Final    GRAM POSITIVE COCCI AEROBIC BOTTLE ONLY CRITICAL RESULT CALLED TO, READ BACK BY AND VERIFIED WITH: ROBERTSON,B AT 1613 ON 12/17/22 BY PURIE,J CRITICAL RESULT CALLED TO, READ BACK BY AND VERIFIED WITH: S HARDIN,RN@0203  12/18/22 MK    Culture (A)  Final     STAPHYLOCOCCUS HOMINIS THE SIGNIFICANCE OF ISOLATING THIS ORGANISM FROM A SINGLE SET OF BLOOD CULTURES WHEN MULTIPLE SETS ARE DRAWN IS UNCERTAIN. PLEASE NOTIFY THE MICROBIOLOGY DEPARTMENT WITHIN ONE WEEK IF SPECIATION AND SENSITIVITIES ARE REQUIRED.    Report Status 12/20/2022 FINAL  Final  Blood Culture ID Panel (Reflexed)     Status: Abnormal   Collection Time: 12/16/22  7:03 PM  Result Value Ref Range Status   Enterococcus faecalis NOT DETECTED NOT DETECTED Final   Enterococcus Faecium NOT DETECTED NOT DETECTED Final   Listeria monocytogenes NOT DETECTED NOT DETECTED Final   Staphylococcus species DETECTED (A) NOT DETECTED Final    Comment: CRITICAL RESULT CALLED TO, READ BACK BY AND VERIFIED WITH:  S HARDIN,RN@0204  12/18/22 MK    Staphylococcus aureus (BCID) NOT DETECTED NOT DETECTED Final   Staphylococcus epidermidis NOT DETECTED NOT DETECTED Final   Staphylococcus lugdunensis NOT DETECTED NOT DETECTED Final   Streptococcus species NOT DETECTED NOT DETECTED Final   Streptococcus agalactiae NOT DETECTED NOT DETECTED Final   Streptococcus pneumoniae NOT DETECTED NOT DETECTED Final   Streptococcus pyogenes NOT DETECTED NOT DETECTED Final   A.calcoaceticus-baumannii NOT DETECTED NOT DETECTED Final   Bacteroides fragilis NOT DETECTED NOT DETECTED Final   Enterobacterales NOT DETECTED NOT DETECTED Final   Enterobacter cloacae complex NOT DETECTED NOT DETECTED Final   Escherichia coli NOT DETECTED NOT DETECTED Final   Klebsiella aerogenes NOT DETECTED NOT DETECTED Final   Klebsiella oxytoca NOT DETECTED NOT DETECTED Final   Klebsiella pneumoniae NOT DETECTED NOT DETECTED Final   Proteus species NOT DETECTED NOT DETECTED Final   Salmonella species NOT DETECTED NOT DETECTED Final   Serratia marcescens NOT DETECTED NOT DETECTED Final   Haemophilus influenzae NOT DETECTED NOT DETECTED Final   Neisseria meningitidis NOT DETECTED NOT DETECTED Final   Pseudomonas aeruginosa NOT DETECTED NOT  DETECTED Final   Stenotrophomonas maltophilia NOT DETECTED NOT DETECTED Final   Candida albicans NOT DETECTED NOT DETECTED Final   Candida auris NOT DETECTED NOT DETECTED Final   Candida glabrata NOT DETECTED NOT DETECTED Final   Candida krusei NOT DETECTED NOT DETECTED Final   Candida parapsilosis NOT DETECTED NOT DETECTED Final   Candida tropicalis NOT DETECTED NOT DETECTED Final   Cryptococcus neoformans/gattii NOT DETECTED NOT DETECTED Final    Comment: Performed at [facility], [address] [place], [place] [address]  Resp panel by RT-PCR (RSV, Flu A&B, Covid) Anterior Nasal Swab     Status: None   Collection Time: 12/16/22  7:42 PM   Specimen: Anterior Nasal Swab  Result Value Ref Range Status   SARS Coronavirus 2 by RT PCR NEGATIVE NEGATIVE Final    Comment: (NOTE) SARS-CoV-2 target nucleic acids are NOT DETECTED.  The SARS-CoV-2 RNA is generally detectable in upper respiratory specimens during the acute phase of infection. The lowest concentration of SARS-CoV-2 viral copies this assay can detect is 138 copies/mL. A negative result does not preclude SARS-Cov-2 infection and should not be used as the sole basis for treatment or other patient management decisions. A negative result may occur with  improper specimen collection/handling, submission of specimen other than nasopharyngeal swab, presence of viral mutation(s) within the areas targeted by this assay, and inadequate number of viral copies(<138 copies/mL). A negative result must be combined with clinical observations, patient history, and epidemiological information. The expected result is Negative.  Fact Sheet for Patients:  BloggerCourse.com  Fact Sheet for Healthcare Providers:  SeriousBroker.it  This test is no t yet approved or cleared by the [place] FDA and  has been authorized for detection and/or diagnosis of SARS-CoV-2 by FDA under an  Emergency Use Authorization (EUA). This EUA will remain  in effect (meaning this test can be used) for the duration of the COVID-19 declaration under Section 564(b)(1) of the Act, 21 U.S.C.section 360bbb-3(b)(1), unless the authorization is terminated  or revoked sooner.       Influenza A by PCR NEGATIVE NEGATIVE Final   Influenza B by PCR NEGATIVE NEGATIVE Final    Comment: (NOTE) The Xpert Xpress SARS-CoV-2/FLU/RSV plus assay is intended as an aid in the diagnosis of influenza from Nasopharyngeal swab specimens and should not be used as a sole basis for treatment.  Nasal washings and aspirates are unacceptable for Xpert Xpress SARS-CoV-2/FLU/RSV testing.  Fact Sheet for Patients: BloggerCourse.com  Fact Sheet for Healthcare Providers: SeriousBroker.it  This test is not yet approved or cleared by the [place] FDA and has been authorized for detection and/or diagnosis of SARS-CoV-2 by FDA under an Emergency Use Authorization (EUA). This EUA will remain in effect (meaning this test can be used) for the duration of the COVID-19 declaration under Section 564(b)(1) of the Act, 21 U.S.C. section 360bbb-3(b)(1), unless the authorization is terminated or revoked.     Resp Syncytial Virus by PCR NEGATIVE NEGATIVE Final    Comment: (NOTE) Fact Sheet for Patients: BloggerCourse.com  Fact Sheet for Healthcare Providers: SeriousBroker.it  This test is not yet approved or cleared by the [place] FDA and has been authorized for detection and/or diagnosis of SARS-CoV-2 by FDA under an Emergency Use Authorization (EUA). This EUA will remain in effect (meaning this test can be used) for the duration of the COVID-19 declaration under Section 564(b)(1) of the Act, 21 U.S.C. section 360bbb-3(b)(1), unless the authorization is terminated or revoked.  Performed at [facility],  [address] [place], [place] [address]   Culture, blood (Routine X 2) w Reflex to ID Panel     Status: None (Preliminary result)   Collection Time: 12/17/22  7:24 PM   Specimen: BLOOD  Result Value Ref Range Status   Specimen Description BLOOD LEFT ANTECUBITAL  Final   Special Requests   Final    BOTTLES DRAWN AEROBIC AND ANAEROBIC Blood Culture results may not be optimal due to an excessive volume of blood received in culture bottles   Culture   Final    NO GROWTH 4 DAYS Performed at [facility], [address] [place], [place] [address]    Report Status PENDING  Incomplete  Culture, blood (Routine X 2) w Reflex to ID Panel     Status: None (Preliminary result)   Collection Time: 12/17/22  7:26 PM   Specimen: BLOOD  Result Value Ref Range Status   Specimen Description BLOOD BLOOD LEFT HAND  Final   Special Requests   Final    BOTTLES DRAWN AEROBIC ONLY Blood Culture results may not be optimal due to an excessive volume of blood received in culture bottles   Culture   Final    NO GROWTH 4 DAYS Performed at [facility], [address] [place], [place] [address]    Report Status PENDING  Incomplete     Radiology Studies: No results found.  Scheduled Meds:  acyclovir  200 mg Oral q AM   anastrozole  1 mg Oral Daily   Chlorhexidine Gluconate Cloth  6 each Topical Daily   melatonin  6 mg Oral QHS   Continuous Infusions:  ceFEPime (MAXIPIME) IV 200 mL/hr at 12/18/22 1738   vancomycin 150 mL/hr at 12/18/22 1738    LOS: 5 days   Time spent: 46 mins  Decarlos Empey Laural Benes, MD How to contact the [facility] Attending or Consulting provider 7A - 7P or covering provider during after hours 7P -7A, for this patient?  Check the care team in [facility] and look for a) attending/consulting [facility] provider listed and b) the [facility] team listed Log into [email or web address] to find provider on call.  Locate the [facility] provider you are looking for under [facility] and page to a number that you can be directly  reached. If you still have difficulty reaching the provider, please page the [facility] (Director on Call) for  the Hospitalists listed on amion for assistance.  12/21/2022, 3:31 PM

## 2022-12-21 NOTE — Procedures (Signed)
Vascular and Interventional Radiology Procedure Note  Patient: Carly Wood DOB: 28-Oct-1935 Medical Record Number: [id number] Note Date/Time: 12/21/22 1:17 PM   Performing Physician: Roanna Banning, MD Assistant(s): None  Diagnosis: ESRD requiring Hemodialysis  Procedure: TUNNELED HEMODIALYSIS CATHETER PLACEMENT  Anesthesia: Conscious Sedation Complications: None Estimated Blood Loss: Minimal Specimens:  None  Findings:  Successful placement of left-sided, 23 cm (tip-to-cuff), tunneled hemodialysis catheter with the tip of the catheter in the proximal right atrium.  Plan: Catheter ready for use.  See detailed procedure note with images in PACS. The patient tolerated the procedure well without incident or complication and was returned to Recovery in stable condition.    Roanna Banning, MD [facility] Radiology   Pager. [phone] Clinic. [phone]

## 2022-12-22 ENCOUNTER — Inpatient Hospital Stay: Payer: Medicare PPO

## 2022-12-22 ENCOUNTER — Inpatient Hospital Stay: Payer: Medicare PPO | Admitting: Hematology

## 2022-12-22 DIAGNOSIS — K269 Duodenal ulcer, unspecified as acute or chronic, without hemorrhage or perforation: Secondary | ICD-10-CM | POA: Diagnosis not present

## 2022-12-22 DIAGNOSIS — C9000 Multiple myeloma not having achieved remission: Secondary | ICD-10-CM

## 2022-12-22 DIAGNOSIS — F339 Major depressive disorder, recurrent, unspecified: Secondary | ICD-10-CM | POA: Diagnosis not present

## 2022-12-22 DIAGNOSIS — F01518 Vascular dementia, unspecified severity, with other behavioral disturbance: Secondary | ICD-10-CM | POA: Diagnosis not present

## 2022-12-22 DIAGNOSIS — E1122 Type 2 diabetes mellitus with diabetic chronic kidney disease: Secondary | ICD-10-CM | POA: Diagnosis not present

## 2022-12-22 DIAGNOSIS — N186 End stage renal disease: Secondary | ICD-10-CM | POA: Diagnosis not present

## 2022-12-22 DIAGNOSIS — D6481 Anemia due to antineoplastic chemotherapy: Secondary | ICD-10-CM | POA: Diagnosis not present

## 2022-12-22 DIAGNOSIS — E1169 Type 2 diabetes mellitus with other specified complication: Secondary | ICD-10-CM | POA: Diagnosis not present

## 2022-12-22 DIAGNOSIS — T829XXA Unspecified complication of cardiac and vascular prosthetic device, implant and graft, initial encounter: Secondary | ICD-10-CM | POA: Diagnosis not present

## 2022-12-22 DIAGNOSIS — B9689 Other specified bacterial agents as the cause of diseases classified elsewhere: Secondary | ICD-10-CM | POA: Diagnosis not present

## 2022-12-22 DIAGNOSIS — E871 Hypo-osmolality and hyponatremia: Secondary | ICD-10-CM | POA: Diagnosis not present

## 2022-12-22 DIAGNOSIS — I700 Atherosclerosis of aorta: Secondary | ICD-10-CM | POA: Diagnosis not present

## 2022-12-22 DIAGNOSIS — K219 Gastro-esophageal reflux disease without esophagitis: Secondary | ICD-10-CM | POA: Diagnosis not present

## 2022-12-22 DIAGNOSIS — N250 Renal osteodystrophy: Secondary | ICD-10-CM | POA: Diagnosis not present

## 2022-12-22 DIAGNOSIS — N2581 Secondary hyperparathyroidism of renal origin: Secondary | ICD-10-CM | POA: Diagnosis not present

## 2022-12-22 DIAGNOSIS — M810 Age-related osteoporosis without current pathological fracture: Secondary | ICD-10-CM | POA: Diagnosis not present

## 2022-12-22 DIAGNOSIS — T80212A Local infection due to central venous catheter, initial encounter: Secondary | ICD-10-CM | POA: Diagnosis not present

## 2022-12-22 DIAGNOSIS — A419 Sepsis, unspecified organism: Secondary | ICD-10-CM | POA: Diagnosis not present

## 2022-12-22 DIAGNOSIS — Z992 Dependence on renal dialysis: Secondary | ICD-10-CM | POA: Diagnosis not present

## 2022-12-22 DIAGNOSIS — R7881 Bacteremia: Secondary | ICD-10-CM | POA: Diagnosis not present

## 2022-12-22 DIAGNOSIS — D631 Anemia in chronic kidney disease: Secondary | ICD-10-CM | POA: Diagnosis not present

## 2022-12-22 DIAGNOSIS — A411 Sepsis due to other specified staphylococcus: Secondary | ICD-10-CM | POA: Diagnosis not present

## 2022-12-22 DIAGNOSIS — Z0000 Encounter for general adult medical examination without abnormal findings: Secondary | ICD-10-CM | POA: Diagnosis not present

## 2022-12-22 DIAGNOSIS — R4182 Altered mental status, unspecified: Secondary | ICD-10-CM | POA: Diagnosis not present

## 2022-12-22 DIAGNOSIS — C50111 Malignant neoplasm of central portion of right female breast: Secondary | ICD-10-CM | POA: Diagnosis not present

## 2022-12-22 DIAGNOSIS — I120 Hypertensive chronic kidney disease with stage 5 chronic kidney disease or end stage renal disease: Secondary | ICD-10-CM | POA: Diagnosis not present

## 2022-12-22 DIAGNOSIS — I152 Hypertension secondary to endocrine disorders: Secondary | ICD-10-CM | POA: Diagnosis not present

## 2022-12-22 LAB — RENAL FUNCTION PANEL
Albumin: 3.2 g/dL — ABNORMAL LOW (ref 3.5–5.0)
Anion gap: 20 — ABNORMAL HIGH (ref 5–15)
BUN: 81 mg/dL — ABNORMAL HIGH (ref 8–23)
CO2: 20 mmol/L — ABNORMAL LOW (ref 22–32)
Calcium: 8.8 mg/dL — ABNORMAL LOW (ref 8.9–10.3)
Chloride: 96 mmol/L — ABNORMAL LOW (ref 98–111)
Creatinine, Ser: 12.02 mg/dL — ABNORMAL HIGH (ref 0.44–1.00)
GFR, Estimated: 3 mL/min — ABNORMAL LOW (ref >60)
Glucose, Bld: 90 mg/dL (ref 70–99)
Phosphorus: 10.8 mg/dL — ABNORMAL HIGH (ref 2.5–4.6)
Potassium: 5 mmol/L (ref 3.5–5.1)
Sodium: 136 mmol/L (ref 135–145)

## 2022-12-22 LAB — CBC WITH DIFFERENTIAL/PLATELET
Abs Immature Granulocytes: 0.02 10*3/uL (ref 0.00–0.07)
Basophils Absolute: 0 10*3/uL (ref 0.0–0.1)
Basophils Relative: 1 %
Eosinophils Absolute: 0.2 10*3/uL (ref 0.0–0.5)
Eosinophils Relative: 5 %
HCT: 30.1 % — ABNORMAL LOW (ref 36.0–46.0)
Hemoglobin: 9.8 g/dL — ABNORMAL LOW (ref 12.0–15.0)
Immature Granulocytes: 1 %
Lymphocytes Relative: 20 %
Lymphs Abs: 0.8 10*3/uL (ref 0.7–4.0)
MCH: 32.6 pg (ref 26.0–34.0)
MCHC: 32.6 g/dL (ref 30.0–36.0)
MCV: 100 fL (ref 80.0–100.0)
Monocytes Absolute: 0.6 10*3/uL (ref 0.1–1.0)
Monocytes Relative: 13 %
Neutro Abs: 2.5 10*3/uL (ref 1.7–7.7)
Neutrophils Relative %: 60 %
Platelets: 184 10*3/uL (ref 150–400)
RBC: 3.01 MIL/uL — ABNORMAL LOW (ref 3.87–5.11)
RDW: 15.2 % (ref 11.5–15.5)
WBC: 4.2 10*3/uL (ref 4.0–10.5)
nRBC: 0 % (ref 0.0–0.2)

## 2022-12-22 LAB — GLUCOSE, CAPILLARY: Glucose-Capillary: 105 mg/dL — ABNORMAL HIGH (ref 70–99)

## 2022-12-22 LAB — CULTURE, BLOOD (ROUTINE X 2)
Culture: NO GROWTH
Culture: NO GROWTH

## 2022-12-22 MED ORDER — VANCOMYCIN HCL 750 MG/150ML IV SOLN: 750.0000 mg | INTRAVENOUS | Status: AC

## 2022-12-22 MED ORDER — VANCOMYCIN HCL 750 MG/150ML IV SOLN
750.0000 mg | INTRAVENOUS | Status: DC
  Administered 2022-12-22: 750 mg via INTRAVENOUS
  Filled 2022-12-22: qty 150

## 2022-12-22 MED ORDER — HEPARIN SODIUM (PORCINE) 1000 UNIT/ML IJ SOLN
INTRAMUSCULAR | Status: AC
  Filled 2022-12-22: qty 6

## 2022-12-22 MED ORDER — HEPARIN SODIUM (PORCINE) 1000 UNIT/ML IJ SOLN
3800.0000 [IU] | Freq: Once | INTRAMUSCULAR | Status: AC
  Administered 2022-12-22: 3800 [IU]

## 2022-12-22 NOTE — Progress Notes (Signed)
Patient ID: Carly Wood, female   DOB: 04/21/1935, 87 y.o.   MRN: [id number] S: tolerated LIJ TDC placement yesterday by IR.  Census too high for HD yesterday and will have HD today. O:BP (!) 170/67 (BP Location: Right Arm)   Pulse 69   Temp 97.9 F (36.6 C)   Resp 20   Ht 5\' 3"  (1.6 m)   Wt 63 kg   SpO2 97%   BMI 24.60 kg/m   Intake/Output Summary (Last 24 hours) at 12/22/2022 0846 Last data filed at 12/22/2022 0823 Gross per 24 hour  Intake 460 ml  Output 0 ml  Net 460 ml   Intake/Output: I/O last 3 completed shifts: In: 220 [P.O.:220] Out: 0   Intake/Output this shift:  Total I/O In: 240 [P.O.:240] Out: -  Weight change:  Gen: NAD CVS: RRR Resp:CTA Abd: +BS, soft, NT/DN Ext: trace pretibial edema  Recent Labs  Lab 12/16/22 1807 12/17/22 0434 12/18/22 1118 12/19/22 0456 12/20/22 0526 12/21/22 0339 12/22/22 0405  NA 133* 133* 131* 137 136 137 136  K 4.1 4.6 4.9 4.1 4.4 4.6 5.0  CL 93* 95* 92* 97* 97* 97* 96*  CO2 24 24 24 26 24  21* 20*  GLUCOSE 123* 72 149* 83 80 74 90  BUN 22 28* 60* 34* 53* 66* 81*  CREATININE 4.81* 6.00* 8.82* 5.75* 8.00* 9.97* 12.02*  ALBUMIN 4.1  --  3.2* 3.4* 3.3* 3.4* 3.2*  CALCIUM 9.4 9.4 8.5* 9.4 9.0 9.0 8.8*  PHOS  --   --  7.9* 6.1* 7.8* 10.0* 10.8*  AST 18  --   --   --   --   --   --   ALT 14  --   --   --   --   --   --    Liver Function Tests: Recent Labs  Lab 12/16/22 1807 12/18/22 1118 12/20/22 0526 12/21/22 0339 12/22/22 0405  AST 18  --   --   --   --   ALT 14  --   --   --   --   ALKPHOS 80  --   --   --   --   BILITOT 0.6  --   --   --   --   PROT 7.9  --   --   --   --   ALBUMIN 4.1   < > 3.3* 3.4* 3.2*   < > = values in this interval not displayed.   Recent Labs  Lab 12/16/22 1807  LIPASE 82*   No results for input(s): "AMMONIA" in the last 168 hours. CBC: Recent Labs  Lab 12/18/22 0429 12/19/22 0456 12/20/22 0526 12/21/22 0339 12/22/22 0405  WBC 4.2 4.1 4.0 4.5 4.2  NEUTROABS 2.3 2.5  2.3 2.8 2.5  HGB 10.0* 10.6* 10.2* 10.1* 9.8*  HCT 30.4* 33.6* 33.4* 32.5* 30.1*  MCV 101.3* 101.8* 103.1* 102.2* 100.0  PLT 150 167 162 190 184   Cardiac Enzymes: No results for input(s): "CKTOTAL", "CKMB", "CKMBINDEX", "TROPONINI" in the last 168 hours. CBG: Recent Labs  Lab 12/19/22 1153 12/19/22 1641 12/19/22 2139 12/20/22 0731 12/20/22 1134  GLUCAP 86 95 146* 78 152*    Iron Studies: No results for input(s): "IRON", "TIBC", "TRANSFERRIN", "FERRITIN" in the last 72 hours. Studies/Results: IR Fluoro Guide CV Line Left  Result Date: 12/21/2022 INDICATION: ESRD requiring HD. Recent HD catheter removal for bacteremia. Completely line holiday. EXAM: TUNNELED CENTRAL VENOUS HEMODIALYSIS CATHETER PLACEMENT WITH ULTRASOUND AND FLUOROSCOPIC GUIDANCE  MEDICATIONS: Ancef 2 gm IV . The antibiotic was given in an appropriate time interval prior to skin puncture. ANESTHESIA/SEDATION: Moderate (conscious) sedation was employed during this procedure. A total of Versed 1 mg and Fentanyl 50 mcg was administered intravenously. Moderate Sedation Time: 21 minutes. The patient's level of consciousness and vital signs were monitored continuously by radiology nursing throughout the procedure under my direct supervision. FLUOROSCOPY TIME:  Fluoroscopic dose; 5 mGy COMPLICATIONS: None immediate. PROCEDURE: Informed written consent was obtained from the patient and/or patient's representative after a discussion of the risks, benefits, and alternatives to treatment. Questions regarding the procedure were encouraged and answered. The LEFT neck and chest were prepped with chlorhexidine in a sterile fashion, and a sterile drape was applied covering the operative field. Maximum barrier sterile technique with sterile gowns and gloves were used for the procedure. A timeout was performed prior to the initiation of the procedure. After creating a small venotomy incision, a micropuncture kit was utilized to access the  internal jugular vein. Real-time ultrasound guidance was utilized for vascular access including the acquisition of a permanent ultrasound image documenting patency of the accessed vessel. The microwire was utilized to measure appropriate catheter length. A stiff Glidewire was advanced to the level of the IVC and the micropuncture sheath was exchanged for a peel-away sheath. A palindrome tunneled hemodialysis catheter measuring 23 cm from tip to cuff was tunneled in a retrograde fashion from the anterior chest wall to the venotomy incision. The catheter was then placed through the peel-away sheath with tips ultimately positioned within the superior aspect of the right atrium. Final catheter positioning was confirmed and documented with a spot radiographic image. The catheter aspirates and flushes normally. The catheter was flushed with appropriate volume heparin dwells. The catheter exit site was secured with a 2-0 Ethilon retention suture. The venotomy incision was closed with Dermabond. Dressings were applied. The patient tolerated the procedure well without immediate post procedural complication. IMPRESSION: Successful placement of 23 cm tip to cuff tunneled hemodialysis catheter via the LEFT internal jugular vein The tip of the catheter is positioned at the superior cavo-atrial junction. The catheter is ready for immediate use. Roanna Banning, MD [facility] Electronically Signed   By: Roanna Banning M.D.   On: 12/21/2022 15:51   IR US Guide Vasc Access Left  Result Date: 12/21/2022 INDICATION: ESRD requiring HD. Recent HD catheter removal for bacteremia. Completely line holiday. EXAM: TUNNELED CENTRAL VENOUS HEMODIALYSIS CATHETER PLACEMENT WITH ULTRASOUND AND FLUOROSCOPIC GUIDANCE MEDICATIONS: Ancef 2 gm IV . The antibiotic was given in an appropriate time interval prior to skin puncture. ANESTHESIA/SEDATION: Moderate (conscious) sedation was employed  during this procedure. A total of Versed 1 mg and Fentanyl 50 mcg was administered intravenously. Moderate Sedation Time: 21 minutes. The patient's level of consciousness and vital signs were monitored continuously by radiology nursing throughout the procedure under my direct supervision. FLUOROSCOPY TIME:  Fluoroscopic dose; 5 mGy COMPLICATIONS: None immediate. PROCEDURE: Informed written consent was obtained from the patient and/or patient's representative after a discussion of the risks, benefits, and alternatives to treatment. Questions regarding the procedure were encouraged and answered. The LEFT neck and chest were prepped with chlorhexidine in a sterile fashion, and a sterile drape was applied covering the operative field. Maximum barrier sterile technique with sterile gowns and gloves were used for the procedure. A timeout was performed prior to the initiation of the procedure. After creating a small venotomy incision, a micropuncture kit was utilized  to access the internal jugular vein. Real-time ultrasound guidance was utilized for vascular access including the acquisition of a permanent ultrasound image documenting patency of the accessed vessel. The microwire was utilized to measure appropriate catheter length. A stiff Glidewire was advanced to the level of the IVC and the micropuncture sheath was exchanged for a peel-away sheath. A palindrome tunneled hemodialysis catheter measuring 23 cm from tip to cuff was tunneled in a retrograde fashion from the anterior chest wall to the venotomy incision. The catheter was then placed through the peel-away sheath with tips ultimately positioned within the superior aspect of the right atrium. Final catheter positioning was confirmed and documented with a spot radiographic image. The catheter aspirates and flushes normally. The catheter was flushed with appropriate volume heparin dwells. The catheter exit site was secured with a 2-0 Ethilon retention suture. The  venotomy incision was closed with Dermabond. Dressings were applied. The patient tolerated the procedure well without immediate post procedural complication. IMPRESSION: Successful placement of 23 cm tip to cuff tunneled hemodialysis catheter via the LEFT internal jugular vein The tip of the catheter is positioned at the superior cavo-atrial junction. The catheter is ready for immediate use. Roanna Banning, MD [facility] Rad[facility] Electronically Signed   By: Roanna Banning M.D.   On: 12/21/2022 15:51    acyclovir  200 mg Oral q AM   anastrozole  1 mg Oral Daily   Chlorhexidine Gluconate Cloth  6 each Topical Daily   melatonin  6 mg Oral QHS    BMET    Component Value Date/Time   NA 136 12/22/2022 0405   K 5.0 12/22/2022 0405   CL 96 (L) 12/22/2022 0405   CO2 20 (L) 12/22/2022 0405   GLUCOSE 90 12/22/2022 0405   BUN 81 (H) 12/22/2022 0405   CREATININE 12.02 (H) 12/22/2022 0405   CALCIUM 8.8 (L) 12/22/2022 0405   CALCIUM 9.0 10/18/2018 1059   GFRNONAA 3 (L) 12/22/2022 0405   GFRAA 5 (L) 12/05/2019 1241   CBC    Component Value Date/Time   WBC 4.2 12/22/2022 0405   RBC 3.01 (L) 12/22/2022 0405   HGB 9.8 (L) 12/22/2022 0405   HCT 30.1 (L) 12/22/2022 0405   PLT 184 12/22/2022 0405   MCV 100.0 12/22/2022 0405   MCH 32.6 12/22/2022 0405   MCHC 32.6 12/22/2022 0405   RDW 15.2 12/22/2022 0405   LYMPHSABS 0.8 12/22/2022 0405   MONOABS 0.6 12/22/2022 0405   EOSABS 0.2 12/22/2022 0405   BASOSABS 0.0 12/22/2022 0405    Assessment/Plan:   Staphylococcus hominis bacteremia - presumably due to [facility] which was removed on 12/18/22 after HD.  Currently on vanco and cefepime.  Abx per primary svc  ESRD - on MWF schedule.  Last HD on 12/18/22 and LIJ TDC removed.  Had new LIJ TDC placed 12/21/22 by IR.  Will plan for HD today and will eventually get back on schedule.  If she can be discharged after HD today, she can then go to her outpatient unit  to get back on schedule.   Anemia - stable CKD-BMD - stable for now. AMS - improved to baseline.  Likely due to fever and infection. HTN/volume - stable Myeloma - currently on Velcade per Hematology Dementia - stable Vascular access - as above. Disposition - per primary svc, hopeful discharge today after HD.   Irena Cords, MD [facility]

## 2022-12-22 NOTE — Progress Notes (Signed)
   HEMODIALYSIS TREATMENT NOTE:  Uneventful 3.5 hour heparin-free treatment completed using left internal jugular tunneled catheter. Goal met: 2. 2 liters removed without interruption in UF.  Hemodynamically stable throughout session.  Vancomycin 750 mg given during last hour of therapy.  All blood was returned.    Post-HD:  12/22/22 1645  Vitals  Temp 98.1 F (36.7 C)  Temp Source Oral  BP 132/84  MAP (mmHg) 97  BP Location Right Wrist  BP Method Automatic  Patient Position (if appropriate) Lying  Pulse Rate 99  Pulse Rate Source Monitor  ECG Heart Rate (!) 102  Resp 20  Oxygen Therapy  SpO2 99 %  O2 Device Room Air  Post Treatment  Dialyzer Clearance Lightly streaked  Hemodialysis Intake (mL) 0 mL  Liters Processed 75.8  Fluid Removed (mL) 2200 mL  Tolerated HD Treatment Yes  Post-Hemodialysis Comments Goal met  Hemodialysis Catheter Left Internal jugular Double lumen Permanent (Tunneled)  Placement Date/Time: 12/21/22 1406   Serial / Lot #: [id number]  Expiration Date: 05/07/27  Time Out: Correct patient;Correct site;Correct procedure  Maximum sterile barrier precautions: Hand hygiene;Cap;Mask;Sterile gown;Sterile gloves;Sterile probe c...  Site Condition No complications  Blue Lumen Status Flushed;Heparin locked;Dead end cap in place  Red Lumen Status Flushed;Heparin locked;Dead end cap in place  Purple Lumen Status N/A  Catheter fill solution Heparin 1000 units/ml  Catheter fill volume (Arterial) 1.9 cc  Catheter fill volume (Venous) 1.9  Dressing Type Transparent;Tube stabilization device  Dressing Status Antimicrobial disc in place  Interventions New dressing  Drainage Description None  Dressing Change Due 12/29/22  Post treatment catheter status Capped and Clamped    Arman Filter, RN AP [facility]

## 2022-12-22 NOTE — Plan of Care (Signed)
Problem: Coping: Goal: Level of anxiety will decrease Outcome: Progressing   Problem: Pain Managment: Goal: General experience of comfort will improve Outcome: Progressing   Problem: Skin Integrity: Goal: Risk for impaired skin integrity will decrease Outcome: Progressing

## 2022-12-22 NOTE — Progress Notes (Signed)
Patient discharged to [facility],report called and given to Carollee Sires, LPN. IV discontinued,catheter intact. Accompanied by staff to [facility].

## 2022-12-22 NOTE — TOC Initial Note (Signed)
Transition of Care [facility]) - Initial/Assessment Note    Patient Details  Name: Carly Wood MRN: [id number] Date of Birth: 02/27/1936  Transition of Care [facility] CM/SW Contact:    Karn Cassis, LCSW Phone Number: 12/22/2022, 2:42 PM  Clinical Narrative: Pt d/c today back to [facility]. Authorization received. Pt, RN, and SNF aware and agreeable. LCSW notified pt's sister by voicemail. D/C summary sent to SNF and dialysis center. LCSW spoke with Selena Batten, RN at [facility] who is aware pt will need IV antibiotics at dialysis. Pt will transfer with staff. RN given number to call report.                   Expected Discharge Plan: Skilled Nursing Facility Barriers to Discharge: Barriers Resolved   Patient Goals and CMS Choice            Expected Discharge Plan and Services         Expected Discharge Date: 12/22/22                                    Prior Living Arrangements/Services   Lives with:: Facility Resident                   Activities of Daily Living   ADL Screening (condition at time of admission) Independently performs ADLs?: No Does the patient have a NEW difficulty with bathing/dressing/toileting/self-feeding that is expected to last >3 days?: Yes (Initiates electronic notice to provider for possible OT consult) Does the patient have a NEW difficulty with getting in/out of bed, walking, or climbing stairs that is expected to last >3 days?: Yes (Initiates electronic notice to provider for possible PT consult) Does the patient have a NEW difficulty with communication that is expected to last >3 days?: No Is the patient deaf or have difficulty hearing?: No Does the patient have difficulty seeing, even when wearing glasses/contacts?: No Does the patient have difficulty concentrating, remembering, or making decisions?: Yes  Permission Sought/Granted                  Emotional Assessment         Alcohol / Substance Use: Not  Applicable Psych Involvement: No (comment)  Admission diagnosis:  Sepsis (HCC) [A41.9] Sepsis, due to unspecified organism, unspecified whether acute organ dysfunction present [facility]) [A41.9] Patient Active Problem List   Diagnosis Date Noted   Gram-positive cocci bacteremia 12/18/2022   Complication associated with dialysis catheter 12/18/2022   Sepsis (HCC) 12/16/2022   Chronic generalized pain 08/21/2022   Numbness and tingling in both hands 07/16/2022   Post-menopausal osteoporosis 12/11/2021   Elevated LFTs 10/03/2021   Syncope 10/01/2021   Elevated liver enzymes 10/01/2021   Breast cancer (HCC) 10/01/2021   Calculus of gallbladder without cholecystitis without obstruction    History of hip fracture 07/03/2021   Edema 02/06/2021   Type 2 diabetes mellitus with hypertension and end stage renal disease on dialysis (HCC) 12/19/2020   Protein-calorie malnutrition, severe (HCC) 07/31/2020   Duodenal ulcer    Acute upper GI bleed 07/22/2020   Rectal bleeding 07/22/2020   Major depression, recurrent, chronic (HCC) 07/18/2020   ESRD on hemodialysis (HCC) 07/13/2020   Hyperlipidemia associated with type 2 diabetes mellitus (HCC) 06/19/2020   Aortic atherosclerosis (HCC) 04/01/2020   Atherosclerotic peripheral vascular disease (HCC) 04/01/2020   Hyperkalemia 12/25/2019   Dependence on renal dialysis (HCC) 10/25/2019   Chronic  kidney disease with end stage renal disease on dialysis due to type 2 diabetes mellitus (HCC) 10/19/2019   Hypertension due to end stage renal disease caused by type 2 diabetes mellitus, on dialysis (HCC) 10/19/2019   Anemia due to end stage renal disease (HCC) 10/19/2019   Herpes 10/19/2019   Vascular dementia with behavior disturbance (HCC) 10/19/2019   Diabetes mellitus with ESRD (end-stage renal disease) (HCC) 10/12/2019   Multiple myeloma (HCC) 10/04/2018   MGUS (monoclonal gammopathy of unknown significance) 09/20/2018   Iron deficiency anemia 05/03/2017    Multiple gastric ulcers 11/10/2016   Dysphagia 07/31/2016   GERD (gastroesophageal reflux disease) 07/31/2016   Lymphedema of arm 08/22/2015   Stage 1 infiltrating ductal carcinoma of right female breast (HCC) 08/21/2015   PCP:  Sharee Holster, NP Pharmacy:   [facility] - [place], [place] - [address] [place] [address] Phone: [phone] Fax: [phone]  [facility] - [place], [place] - [address] [place] [address] Phone: [phone] Fax: [phone]     Social Determinants of Health (SDOH) Social History: SDOH Screenings   Food Insecurity: No Food Insecurity (12/16/2022)  Housing: Low Risk  (12/16/2022)  Transportation Needs: No Transportation Needs (12/16/2022)  Utilities: Not At Risk (12/16/2022)  Alcohol Screen: Low Risk  (01/09/2020)  Depression (PHQ2-9): Low Risk  (03/13/2022)  Financial Resource Strain: Low Risk  (01/09/2020)  Physical Activity: Inactive (01/09/2020)  Social Connections: Moderately Isolated (01/09/2020)  Stress: No Stress Concern Present (01/09/2020)  Tobacco Use: Low Risk  (11/24/2022)   SDOH Interventions:     Readmission Risk Interventions    10/03/2021   12:36 PM 07/25/2020    2:09 PM 07/23/2020   10:59 AM  Readmission Risk Prevention Plan  Transportation Screening Complete Complete Complete  PCP or Specialist Appt within 3-5 Days Complete    HRI or Home Care Consult Complete    Social Work Consult for Recovery Care Planning/Counseling Complete    Palliative Care Screening Complete    Medication Review Oceanographer) Complete  Complete  HRI or Home Care Consult   Complete  Palliative Care Screening   Not Applicable  Skilled Nursing Facility   Complete

## 2022-12-22 NOTE — Consult Note (Signed)
[facility] Customer service manager [facility]) Accountable Care Organization (ACO) [facility] Liaison Note  12/22/2022  [name] 12/29/1935 [id number]  Location: [facility] RN Hospital Liaison screened the patient remotely at [facility].  Insurance: [facility] HMO   Carly Wood is a 87 y.o. female who is a Primary Care Patient of Chilton Si, Chong Sicilian, NP [facility]). The patient was screened for readmission hospitalization with noted high risk score for unplanned readmission risk with 1 IP in 6 months.  The patient was assessed for potential [facility]) Care Management service needs for post hospital transition for care coordination. Review of patient's electronic medical record reveals patient was admitted for Sepsis. Pt will return to [facility] at SNF level (pt is a resident at [facility]).  No care management needs at this time.  [facility] Care Management/Population Health does not replace or interfere with any arrangements made by the Inpatient Transition of Care team.   For questions contact:   Elliot Cousin, RN, [facility] Liaison [place]   Population Health Office Hours MTWF  8:00 am-6:00 pm [phone] mobile [phone] [Office toll free line] Office Hours are M-F 8:30 - 5 pm [email or web address]

## 2022-12-22 NOTE — Plan of Care (Signed)
  Problem: Pain Managment: Goal: General experience of comfort will improve Outcome: Progressing   

## 2022-12-22 NOTE — Discharge Instructions (Addendum)
Pt is to receive IV vancomycin with hemodialysis thru 12/30/2022 to treat line infection  IMPORTANT INFORMATION: PAY CLOSE ATTENTION   PHYSICIAN DISCHARGE INSTRUCTIONS  Follow with Primary care provider  Sharee Holster, NP  and other consultants as instructed by your Hospitalist Physician  SEEK MEDICAL CARE OR RETURN TO EMERGENCY ROOM IF SYMPTOMS COME BACK, WORSEN OR NEW PROBLEM DEVELOPS   Please note: You were cared for by a hospitalist during your hospital stay. Every effort will be made to forward records to your primary care provider.  You can request that your primary care provider send for your hospital records if they have not received them.  Once you are discharged, your primary care physician will handle any further medical issues. Please note that NO REFILLS for any discharge medications will be authorized once you are discharged, as it is imperative that you return to your primary care physician (or establish a relationship with a primary care physician if you do not have one) for your post hospital discharge needs so that they can reassess your need for medications and monitor your lab values.  Please get a complete blood count and chemistry panel checked by your Primary MD at your next visit, and again as instructed by your Primary MD.  Get Medicines reviewed and adjusted: Please take all your medications with you for your next visit with your Primary MD  Laboratory/radiological data: Please request your Primary MD to go over all hospital tests and procedure/radiological results at the follow up, please ask your primary care provider to get all Hospital records sent to his/her office.  In some cases, they will be blood work, cultures and biopsy results pending at the time of your discharge. Please request that your primary care provider follow up on these results.  If you are diabetic, please bring your blood sugar readings with you to your follow up appointment with primary  care.    Please call and make your follow up appointments as soon as possible.    Also Note the following: If you experience worsening of your admission symptoms, develop shortness of breath, life threatening emergency, suicidal or homicidal thoughts you must seek medical attention immediately by calling 911 or calling your MD immediately  if symptoms less severe.  You must read complete instructions/literature along with all the possible adverse reactions/side effects for all the Medicines you take and that have been prescribed to you. Take any new Medicines after you have completely understood and accpet all the possible adverse reactions/side effects.   Do not drive when taking Pain medications or sleeping medications (Benzodiazepines)  Do not take more than prescribed Pain, Sleep and Anxiety Medications. It is not advisable to combine anxiety,sleep and pain medications without talking with your primary care practitioner  Special Instructions: If you have smoked or chewed Tobacco  in the last 2 yrs please stop smoking, stop any regular Alcohol  and or any Recreational drug use.  Wear Seat belts while driving.  Do not drive if taking any narcotic, mind altering or controlled substances or recreational drugs or alcohol.

## 2022-12-22 NOTE — Progress Notes (Signed)
Pharmacy Antibiotic Note  Carly Wood is a 87 y.o. female admitted on 12/16/2022 with sepsis.  Pharmacy has been consulted for vancomycin dosing.   Patient unable to get HD yesterday 10/14 but on schedule for today. Will retime vancomycin   Plan: Vanc 750mg  ter each HD session Next 10/15  Height: 5\' 3"  (160 cm) Weight: 63 kg (138 lb 14.2 oz) IBW/kg (Calculated) : 52.4  Temp (24hrs), Avg:98.4 F (36.9 C), Min:97.9 F (36.6 C), Max:98.9 F (37.2 C)  Recent Labs  Lab 12/16/22 1807 12/16/22 2012 12/17/22 0434 12/18/22 0429 12/18/22 1118 12/19/22 0456 12/20/22 0526 12/21/22 0339 12/22/22 0405  WBC 6.7  --    < > 4.2  --  4.1 4.0 4.5 4.2  CREATININE 4.81*  --    < >  --  8.82* 5.75* 8.00* 9.97* 12.02*  LATICACIDVEN 1.4 1.1  --   --   --   --   --   --   --    < > = values in this interval not displayed.    Estimated Creatinine Clearance: 3.1 mL/min (A) (by C-G formula based on SCr of 12.02 mg/dL (H)).    Allergies  Allergen Reactions   Ace Inhibitors Other (See Comments) and Cough    Tongue swelling Angioedema    Angiotensin Receptor Blockers Other (See Comments)    Angioedema with ACE-I   Penicillins Other (See Comments)    Unknown reaction    Antimicrobials this admission: Vanc 10/9 >> Cefepime 10/9> Metronidazole 10/9   Microbiology results: 10/10 Bcx: ngtd 10/9 bldx2: gram + cocci 1 bottle BCID: staph hominis  Thank you for allowing pharmacy to be a part of this patient's care.  Sheppard Coil PharmD., BCPS Clinical Pharmacist 12/22/2022 11:26 AM

## 2022-12-22 NOTE — Progress Notes (Signed)
OT Cancellation Note  Patient Details Name: Carly Wood MRN: [id number] DOB: 05/14/1935   Cancelled Treatment:    Reason Eval/Treat Not Completed: Patient at procedure or test/ unavailable. Pt not in the room at time of attempted evaluation. Will attempt to see pt later as time permits.   Eliab Closson OT, MOT   Danie Chandler 12/22/2022, 1:15 PM

## 2022-12-22 NOTE — Discharge Summary (Signed)
Physician Discharge Summary  Carly Wood ZOX:[id number] DOB: 05-Nov-1935 DOA: 12/16/2022  PCP: Sharee Holster, NP  Admit date: 12/16/2022 Discharge date: 12/22/2022  Admitted From:  [facility]  Disposition:  [facility]   Recommendations for Outpatient Follow-up:  Please continue IV vancomycin with hemodialysis thru 12/30/22  Please continue hemodialysis as scheduled MWF   Discharge Condition: STABLE   CODE STATUS: DNR  DIET: renal / fluid restricted    Brief Hospitalization Summary: Please see all hospital notes, images, labs for full details of the hospitalization. Admission Provider HPI:  87 y.o. female,  with medical history significant for ESRD, hypertension, diabetes mellitus, dementia, multiple myeloma.  Patient presents to ED secondary to complaints of fever, chills, altered mental status at HD center, patient reports her arm feels heavy, she reports some headache, overall she is a poor historian, she denies any chest pain, shortness of breath, does not make urine, otherwise she cannot give any specific complaints.  Patient with known history of multiple myeloma, she is on Velcade infusion biweekly. -In ED patient was noted to be febrile 101.4, tachypneic, tachycardic, chest x-ray with no acute findings, anuric unable to obtain any urine by in and out,Creatinine at 4.8, BUN at 22, she is having left upper chest HD catheter, blood cultures were sent, she was started on broad-spectrum antibiotics and [facility] hospitalist consulted to admit.  Hospital Course by problem list  Staphylococcus hominis catheter infection  - suspect from infected dialysis catheter - discussed with Dr. Marisue Humble, removed catheter after HD 10/11 - Dr. Henreitta Leber removed catheter 10/11 - nephrology formulating a plan for new cath to be placed on Monday 10/14 - discussed with ID Dr. Thedore Mins, recommendation is 2 weeks of vancomycin IV with Hemodialysis thru 12/30/22.   - 2D echocardiogram no report of vegetation -  repeated blood cultures 12/17/22 No growth to date   ESRD on HD - HD today on schedule per nephrology team - HD cath removed by Dr. Henreitta Leber on 12/18/22 - completed cath holiday, replaced dialysis catheter by IR at [facility] on 10/14  - HD 10/14 after new HD catheter placed  - DC back to SNF on 10/15    History of MM - treated by Dr. Ellin Saba outpatient    History of breast CA  - stable on anastrozole   Macrocytic Anemia  - B12, folic acid within normal limits  Discharge Diagnoses:  Principal Problem:   Gram-positive cocci bacteremia Active Problems:   GERD (gastroesophageal reflux disease)   Multiple myeloma (HCC)   Diabetes mellitus with ESRD (end-stage renal disease) (HCC)   Anemia due to end stage renal disease (HCC)   Aortic atherosclerosis (HCC)   Hyperlipidemia associated with type 2 diabetes mellitus (HCC)   ESRD on hemodialysis (HCC)   Breast cancer (HCC)   Sepsis (HCC)   Complication associated with dialysis catheter   Discharge Instructions:  Allergies as of 12/22/2022       Reactions   Ace Inhibitors Other (See Comments), Cough   Tongue swelling Angioedema    Angiotensin Receptor Blockers Other (See Comments)   Angioedema with ACE-I   Penicillins Other (See Comments)   Unknown reaction        Medication List     TAKE these medications    acetaminophen 325 MG tablet Commonly known as: TYLENOL Take 650 mg by mouth 3 (three) times daily.   acyclovir 200 MG capsule Commonly known as: ZOVIRAX Take 200 mg by mouth in the morning. (0800)   anastrozole 1  for the procedure. A timeout was performed prior to the initiation of the procedure. After creating a small venotomy incision, a micropuncture kit was utilized to access the internal jugular vein. Real-time ultrasound guidance was utilized for vascular access including the acquisition of a permanent ultrasound image documenting patency of the accessed vessel. The microwire was utilized to measure appropriate catheter length. A stiff Glidewire was advanced to the level of the IVC and the micropuncture sheath was exchanged for a peel-away sheath. A palindrome tunneled hemodialysis catheter measuring 23 cm from tip to cuff was tunneled in a retrograde fashion from the anterior chest wall to the venotomy incision. The catheter was then placed through the peel-away sheath with tips ultimately positioned within the superior aspect of the right atrium. Final  catheter positioning was confirmed and documented with a spot radiographic image. The catheter aspirates and flushes normally. The catheter was flushed with appropriate volume heparin dwells. The catheter exit site was secured with a 2-0 Ethilon retention suture. The venotomy incision was closed with Dermabond. Dressings were applied. The patient tolerated the procedure well without immediate post procedural complication. IMPRESSION: Successful placement of 23 cm tip to cuff tunneled hemodialysis catheter via the LEFT internal jugular vein The tip of the catheter is positioned at the superior cavo-atrial junction. The catheter is ready for immediate use. Roanna Banning, MD [facility] Radiology Electronically Signed   By: Roanna Banning M.D.   On: 12/21/2022 15:51   ECHOCARDIOGRAM COMPLETE  Result Date: 12/19/2022    ECHOCARDIOGRAM REPORT   Patient Name:   Carly Wood Date of Exam: 12/18/2022 Medical Rec #:  [id number]      Height:       63.0 in Accession #:    [id number]     Weight:       138.9 lb Date of Birth:  05-15-1935     BSA:          1.656 m Patient Age:    86 years       BP:           142/70 mmHg Patient Gender: F              HR:           107 bpm. Exam Location:  Inpatient Procedure: 2D Echo, Cardiac Doppler and Color Doppler Indications:    Bacteremia R78.81  History:        Patient has prior history of Echocardiogram examinations, most                 recent 09/28/2021. Signs/Symptoms:Syncope; Risk                 Factors:Hypertension, Diabetes and Dyslipidemia. ESRD, Breast                 cancer (Right) breast.  Sonographer:    Lucendia Herrlich Referring Phys: [id number] GOKUL KRISHNAN IMPRESSIONS  1. Intracavitary gradient of 98 mmHg (vel 4.9 m/s) due to hyperdynamic function.. Left ventricular ejection fraction, by estimation, is >75%. The left ventricle has hyperdynamic function. The left ventricle has no regional wall motion abnormalities. There is  moderate asymmetric left ventricular hypertrophy of the septal segment. Left ventricular diastolic parameters are indeterminate.  2. Right ventricular systolic function is normal. The right ventricular size is normal. Tricuspid regurgitation signal is inadequate for assessing PA pressure.  3. Chordal SAM. The mitral valve is degenerative. Trivial mitral valve regurgitation. No evidence of mitral stenosis. The mean  Physician Discharge Summary  Carly Wood ZOX:[id number] DOB: 05-Nov-1935 DOA: 12/16/2022  PCP: Sharee Holster, NP  Admit date: 12/16/2022 Discharge date: 12/22/2022  Admitted From:  [facility]  Disposition:  [facility]   Recommendations for Outpatient Follow-up:  Please continue IV vancomycin with hemodialysis thru 12/30/22  Please continue hemodialysis as scheduled MWF   Discharge Condition: STABLE   CODE STATUS: DNR  DIET: renal / fluid restricted    Brief Hospitalization Summary: Please see all hospital notes, images, labs for full details of the hospitalization. Admission Provider HPI:  87 y.o. female,  with medical history significant for ESRD, hypertension, diabetes mellitus, dementia, multiple myeloma.  Patient presents to ED secondary to complaints of fever, chills, altered mental status at HD center, patient reports her arm feels heavy, she reports some headache, overall she is a poor historian, she denies any chest pain, shortness of breath, does not make urine, otherwise she cannot give any specific complaints.  Patient with known history of multiple myeloma, she is on Velcade infusion biweekly. -In ED patient was noted to be febrile 101.4, tachypneic, tachycardic, chest x-ray with no acute findings, anuric unable to obtain any urine by in and out,Creatinine at 4.8, BUN at 22, she is having left upper chest HD catheter, blood cultures were sent, she was started on broad-spectrum antibiotics and [facility] hospitalist consulted to admit.  Hospital Course by problem list  Staphylococcus hominis catheter infection  - suspect from infected dialysis catheter - discussed with Dr. Marisue Humble, removed catheter after HD 10/11 - Dr. Henreitta Leber removed catheter 10/11 - nephrology formulating a plan for new cath to be placed on Monday 10/14 - discussed with ID Dr. Thedore Mins, recommendation is 2 weeks of vancomycin IV with Hemodialysis thru 12/30/22.   - 2D echocardiogram no report of vegetation -  repeated blood cultures 12/17/22 No growth to date   ESRD on HD - HD today on schedule per nephrology team - HD cath removed by Dr. Henreitta Leber on 12/18/22 - completed cath holiday, replaced dialysis catheter by IR at [facility] on 10/14  - HD 10/14 after new HD catheter placed  - DC back to SNF on 10/15    History of MM - treated by Dr. Ellin Saba outpatient    History of breast CA  - stable on anastrozole   Macrocytic Anemia  - B12, folic acid within normal limits  Discharge Diagnoses:  Principal Problem:   Gram-positive cocci bacteremia Active Problems:   GERD (gastroesophageal reflux disease)   Multiple myeloma (HCC)   Diabetes mellitus with ESRD (end-stage renal disease) (HCC)   Anemia due to end stage renal disease (HCC)   Aortic atherosclerosis (HCC)   Hyperlipidemia associated with type 2 diabetes mellitus (HCC)   ESRD on hemodialysis (HCC)   Breast cancer (HCC)   Sepsis (HCC)   Complication associated with dialysis catheter   Discharge Instructions:  Allergies as of 12/22/2022       Reactions   Ace Inhibitors Other (See Comments), Cough   Tongue swelling Angioedema    Angiotensin Receptor Blockers Other (See Comments)   Angioedema with ACE-I   Penicillins Other (See Comments)   Unknown reaction        Medication List     TAKE these medications    acetaminophen 325 MG tablet Commonly known as: TYLENOL Take 650 mg by mouth 3 (three) times daily.   acyclovir 200 MG capsule Commonly known as: ZOVIRAX Take 200 mg by mouth in the morning. (0800)   anastrozole 1  for the procedure. A timeout was performed prior to the initiation of the procedure. After creating a small venotomy incision, a micropuncture kit was utilized to access the internal jugular vein. Real-time ultrasound guidance was utilized for vascular access including the acquisition of a permanent ultrasound image documenting patency of the accessed vessel. The microwire was utilized to measure appropriate catheter length. A stiff Glidewire was advanced to the level of the IVC and the micropuncture sheath was exchanged for a peel-away sheath. A palindrome tunneled hemodialysis catheter measuring 23 cm from tip to cuff was tunneled in a retrograde fashion from the anterior chest wall to the venotomy incision. The catheter was then placed through the peel-away sheath with tips ultimately positioned within the superior aspect of the right atrium. Final  catheter positioning was confirmed and documented with a spot radiographic image. The catheter aspirates and flushes normally. The catheter was flushed with appropriate volume heparin dwells. The catheter exit site was secured with a 2-0 Ethilon retention suture. The venotomy incision was closed with Dermabond. Dressings were applied. The patient tolerated the procedure well without immediate post procedural complication. IMPRESSION: Successful placement of 23 cm tip to cuff tunneled hemodialysis catheter via the LEFT internal jugular vein The tip of the catheter is positioned at the superior cavo-atrial junction. The catheter is ready for immediate use. Roanna Banning, MD [facility] Radiology Electronically Signed   By: Roanna Banning M.D.   On: 12/21/2022 15:51   ECHOCARDIOGRAM COMPLETE  Result Date: 12/19/2022    ECHOCARDIOGRAM REPORT   Patient Name:   Carly Wood Date of Exam: 12/18/2022 Medical Rec #:  [id number]      Height:       63.0 in Accession #:    [id number]     Weight:       138.9 lb Date of Birth:  05-15-1935     BSA:          1.656 m Patient Age:    86 years       BP:           142/70 mmHg Patient Gender: F              HR:           107 bpm. Exam Location:  Inpatient Procedure: 2D Echo, Cardiac Doppler and Color Doppler Indications:    Bacteremia R78.81  History:        Patient has prior history of Echocardiogram examinations, most                 recent 09/28/2021. Signs/Symptoms:Syncope; Risk                 Factors:Hypertension, Diabetes and Dyslipidemia. ESRD, Breast                 cancer (Right) breast.  Sonographer:    Lucendia Herrlich Referring Phys: [id number] GOKUL KRISHNAN IMPRESSIONS  1. Intracavitary gradient of 98 mmHg (vel 4.9 m/s) due to hyperdynamic function.. Left ventricular ejection fraction, by estimation, is >75%. The left ventricle has hyperdynamic function. The left ventricle has no regional wall motion abnormalities. There is  moderate asymmetric left ventricular hypertrophy of the septal segment. Left ventricular diastolic parameters are indeterminate.  2. Right ventricular systolic function is normal. The right ventricular size is normal. Tricuspid regurgitation signal is inadequate for assessing PA pressure.  3. Chordal SAM. The mitral valve is degenerative. Trivial mitral valve regurgitation. No evidence of mitral stenosis. The mean  for the procedure. A timeout was performed prior to the initiation of the procedure. After creating a small venotomy incision, a micropuncture kit was utilized to access the internal jugular vein. Real-time ultrasound guidance was utilized for vascular access including the acquisition of a permanent ultrasound image documenting patency of the accessed vessel. The microwire was utilized to measure appropriate catheter length. A stiff Glidewire was advanced to the level of the IVC and the micropuncture sheath was exchanged for a peel-away sheath. A palindrome tunneled hemodialysis catheter measuring 23 cm from tip to cuff was tunneled in a retrograde fashion from the anterior chest wall to the venotomy incision. The catheter was then placed through the peel-away sheath with tips ultimately positioned within the superior aspect of the right atrium. Final  catheter positioning was confirmed and documented with a spot radiographic image. The catheter aspirates and flushes normally. The catheter was flushed with appropriate volume heparin dwells. The catheter exit site was secured with a 2-0 Ethilon retention suture. The venotomy incision was closed with Dermabond. Dressings were applied. The patient tolerated the procedure well without immediate post procedural complication. IMPRESSION: Successful placement of 23 cm tip to cuff tunneled hemodialysis catheter via the LEFT internal jugular vein The tip of the catheter is positioned at the superior cavo-atrial junction. The catheter is ready for immediate use. Roanna Banning, MD [facility] Radiology Electronically Signed   By: Roanna Banning M.D.   On: 12/21/2022 15:51   ECHOCARDIOGRAM COMPLETE  Result Date: 12/19/2022    ECHOCARDIOGRAM REPORT   Patient Name:   Carly Wood Date of Exam: 12/18/2022 Medical Rec #:  [id number]      Height:       63.0 in Accession #:    [id number]     Weight:       138.9 lb Date of Birth:  05-15-1935     BSA:          1.656 m Patient Age:    86 years       BP:           142/70 mmHg Patient Gender: F              HR:           107 bpm. Exam Location:  Inpatient Procedure: 2D Echo, Cardiac Doppler and Color Doppler Indications:    Bacteremia R78.81  History:        Patient has prior history of Echocardiogram examinations, most                 recent 09/28/2021. Signs/Symptoms:Syncope; Risk                 Factors:Hypertension, Diabetes and Dyslipidemia. ESRD, Breast                 cancer (Right) breast.  Sonographer:    Lucendia Herrlich Referring Phys: [id number] GOKUL KRISHNAN IMPRESSIONS  1. Intracavitary gradient of 98 mmHg (vel 4.9 m/s) due to hyperdynamic function.. Left ventricular ejection fraction, by estimation, is >75%. The left ventricle has hyperdynamic function. The left ventricle has no regional wall motion abnormalities. There is  moderate asymmetric left ventricular hypertrophy of the septal segment. Left ventricular diastolic parameters are indeterminate.  2. Right ventricular systolic function is normal. The right ventricular size is normal. Tricuspid regurgitation signal is inadequate for assessing PA pressure.  3. Chordal SAM. The mitral valve is degenerative. Trivial mitral valve regurgitation. No evidence of mitral stenosis. The mean  Physician Discharge Summary  Carly Wood ZOX:[id number] DOB: 05-Nov-1935 DOA: 12/16/2022  PCP: Sharee Holster, NP  Admit date: 12/16/2022 Discharge date: 12/22/2022  Admitted From:  [facility]  Disposition:  [facility]   Recommendations for Outpatient Follow-up:  Please continue IV vancomycin with hemodialysis thru 12/30/22  Please continue hemodialysis as scheduled MWF   Discharge Condition: STABLE   CODE STATUS: DNR  DIET: renal / fluid restricted    Brief Hospitalization Summary: Please see all hospital notes, images, labs for full details of the hospitalization. Admission Provider HPI:  87 y.o. female,  with medical history significant for ESRD, hypertension, diabetes mellitus, dementia, multiple myeloma.  Patient presents to ED secondary to complaints of fever, chills, altered mental status at HD center, patient reports her arm feels heavy, she reports some headache, overall she is a poor historian, she denies any chest pain, shortness of breath, does not make urine, otherwise she cannot give any specific complaints.  Patient with known history of multiple myeloma, she is on Velcade infusion biweekly. -In ED patient was noted to be febrile 101.4, tachypneic, tachycardic, chest x-ray with no acute findings, anuric unable to obtain any urine by in and out,Creatinine at 4.8, BUN at 22, she is having left upper chest HD catheter, blood cultures were sent, she was started on broad-spectrum antibiotics and [facility] hospitalist consulted to admit.  Hospital Course by problem list  Staphylococcus hominis catheter infection  - suspect from infected dialysis catheter - discussed with Dr. Marisue Humble, removed catheter after HD 10/11 - Dr. Henreitta Leber removed catheter 10/11 - nephrology formulating a plan for new cath to be placed on Monday 10/14 - discussed with ID Dr. Thedore Mins, recommendation is 2 weeks of vancomycin IV with Hemodialysis thru 12/30/22.   - 2D echocardiogram no report of vegetation -  repeated blood cultures 12/17/22 No growth to date   ESRD on HD - HD today on schedule per nephrology team - HD cath removed by Dr. Henreitta Leber on 12/18/22 - completed cath holiday, replaced dialysis catheter by IR at [facility] on 10/14  - HD 10/14 after new HD catheter placed  - DC back to SNF on 10/15    History of MM - treated by Dr. Ellin Saba outpatient    History of breast CA  - stable on anastrozole   Macrocytic Anemia  - B12, folic acid within normal limits  Discharge Diagnoses:  Principal Problem:   Gram-positive cocci bacteremia Active Problems:   GERD (gastroesophageal reflux disease)   Multiple myeloma (HCC)   Diabetes mellitus with ESRD (end-stage renal disease) (HCC)   Anemia due to end stage renal disease (HCC)   Aortic atherosclerosis (HCC)   Hyperlipidemia associated with type 2 diabetes mellitus (HCC)   ESRD on hemodialysis (HCC)   Breast cancer (HCC)   Sepsis (HCC)   Complication associated with dialysis catheter   Discharge Instructions:  Allergies as of 12/22/2022       Reactions   Ace Inhibitors Other (See Comments), Cough   Tongue swelling Angioedema    Angiotensin Receptor Blockers Other (See Comments)   Angioedema with ACE-I   Penicillins Other (See Comments)   Unknown reaction        Medication List     TAKE these medications    acetaminophen 325 MG tablet Commonly known as: TYLENOL Take 650 mg by mouth 3 (three) times daily.   acyclovir 200 MG capsule Commonly known as: ZOVIRAX Take 200 mg by mouth in the morning. (0800)   anastrozole 1  for the procedure. A timeout was performed prior to the initiation of the procedure. After creating a small venotomy incision, a micropuncture kit was utilized to access the internal jugular vein. Real-time ultrasound guidance was utilized for vascular access including the acquisition of a permanent ultrasound image documenting patency of the accessed vessel. The microwire was utilized to measure appropriate catheter length. A stiff Glidewire was advanced to the level of the IVC and the micropuncture sheath was exchanged for a peel-away sheath. A palindrome tunneled hemodialysis catheter measuring 23 cm from tip to cuff was tunneled in a retrograde fashion from the anterior chest wall to the venotomy incision. The catheter was then placed through the peel-away sheath with tips ultimately positioned within the superior aspect of the right atrium. Final  catheter positioning was confirmed and documented with a spot radiographic image. The catheter aspirates and flushes normally. The catheter was flushed with appropriate volume heparin dwells. The catheter exit site was secured with a 2-0 Ethilon retention suture. The venotomy incision was closed with Dermabond. Dressings were applied. The patient tolerated the procedure well without immediate post procedural complication. IMPRESSION: Successful placement of 23 cm tip to cuff tunneled hemodialysis catheter via the LEFT internal jugular vein The tip of the catheter is positioned at the superior cavo-atrial junction. The catheter is ready for immediate use. Roanna Banning, MD [facility] Radiology Electronically Signed   By: Roanna Banning M.D.   On: 12/21/2022 15:51   ECHOCARDIOGRAM COMPLETE  Result Date: 12/19/2022    ECHOCARDIOGRAM REPORT   Patient Name:   Carly Wood Date of Exam: 12/18/2022 Medical Rec #:  [id number]      Height:       63.0 in Accession #:    [id number]     Weight:       138.9 lb Date of Birth:  05-15-1935     BSA:          1.656 m Patient Age:    86 years       BP:           142/70 mmHg Patient Gender: F              HR:           107 bpm. Exam Location:  Inpatient Procedure: 2D Echo, Cardiac Doppler and Color Doppler Indications:    Bacteremia R78.81  History:        Patient has prior history of Echocardiogram examinations, most                 recent 09/28/2021. Signs/Symptoms:Syncope; Risk                 Factors:Hypertension, Diabetes and Dyslipidemia. ESRD, Breast                 cancer (Right) breast.  Sonographer:    Lucendia Herrlich Referring Phys: [id number] GOKUL KRISHNAN IMPRESSIONS  1. Intracavitary gradient of 98 mmHg (vel 4.9 m/s) due to hyperdynamic function.. Left ventricular ejection fraction, by estimation, is >75%. The left ventricle has hyperdynamic function. The left ventricle has no regional wall motion abnormalities. There is  moderate asymmetric left ventricular hypertrophy of the septal segment. Left ventricular diastolic parameters are indeterminate.  2. Right ventricular systolic function is normal. The right ventricular size is normal. Tricuspid regurgitation signal is inadequate for assessing PA pressure.  3. Chordal SAM. The mitral valve is degenerative. Trivial mitral valve regurgitation. No evidence of mitral stenosis. The mean  for the procedure. A timeout was performed prior to the initiation of the procedure. After creating a small venotomy incision, a micropuncture kit was utilized to access the internal jugular vein. Real-time ultrasound guidance was utilized for vascular access including the acquisition of a permanent ultrasound image documenting patency of the accessed vessel. The microwire was utilized to measure appropriate catheter length. A stiff Glidewire was advanced to the level of the IVC and the micropuncture sheath was exchanged for a peel-away sheath. A palindrome tunneled hemodialysis catheter measuring 23 cm from tip to cuff was tunneled in a retrograde fashion from the anterior chest wall to the venotomy incision. The catheter was then placed through the peel-away sheath with tips ultimately positioned within the superior aspect of the right atrium. Final  catheter positioning was confirmed and documented with a spot radiographic image. The catheter aspirates and flushes normally. The catheter was flushed with appropriate volume heparin dwells. The catheter exit site was secured with a 2-0 Ethilon retention suture. The venotomy incision was closed with Dermabond. Dressings were applied. The patient tolerated the procedure well without immediate post procedural complication. IMPRESSION: Successful placement of 23 cm tip to cuff tunneled hemodialysis catheter via the LEFT internal jugular vein The tip of the catheter is positioned at the superior cavo-atrial junction. The catheter is ready for immediate use. Roanna Banning, MD [facility] Radiology Electronically Signed   By: Roanna Banning M.D.   On: 12/21/2022 15:51   ECHOCARDIOGRAM COMPLETE  Result Date: 12/19/2022    ECHOCARDIOGRAM REPORT   Patient Name:   Carly Wood Date of Exam: 12/18/2022 Medical Rec #:  [id number]      Height:       63.0 in Accession #:    [id number]     Weight:       138.9 lb Date of Birth:  05-15-1935     BSA:          1.656 m Patient Age:    86 years       BP:           142/70 mmHg Patient Gender: F              HR:           107 bpm. Exam Location:  Inpatient Procedure: 2D Echo, Cardiac Doppler and Color Doppler Indications:    Bacteremia R78.81  History:        Patient has prior history of Echocardiogram examinations, most                 recent 09/28/2021. Signs/Symptoms:Syncope; Risk                 Factors:Hypertension, Diabetes and Dyslipidemia. ESRD, Breast                 cancer (Right) breast.  Sonographer:    Lucendia Herrlich Referring Phys: [id number] GOKUL KRISHNAN IMPRESSIONS  1. Intracavitary gradient of 98 mmHg (vel 4.9 m/s) due to hyperdynamic function.. Left ventricular ejection fraction, by estimation, is >75%. The left ventricle has hyperdynamic function. The left ventricle has no regional wall motion abnormalities. There is  moderate asymmetric left ventricular hypertrophy of the septal segment. Left ventricular diastolic parameters are indeterminate.  2. Right ventricular systolic function is normal. The right ventricular size is normal. Tricuspid regurgitation signal is inadequate for assessing PA pressure.  3. Chordal SAM. The mitral valve is degenerative. Trivial mitral valve regurgitation. No evidence of mitral stenosis. The mean  Physician Discharge Summary  Carly Wood ZOX:[id number] DOB: 05-Nov-1935 DOA: 12/16/2022  PCP: Sharee Holster, NP  Admit date: 12/16/2022 Discharge date: 12/22/2022  Admitted From:  [facility]  Disposition:  [facility]   Recommendations for Outpatient Follow-up:  Please continue IV vancomycin with hemodialysis thru 12/30/22  Please continue hemodialysis as scheduled MWF   Discharge Condition: STABLE   CODE STATUS: DNR  DIET: renal / fluid restricted    Brief Hospitalization Summary: Please see all hospital notes, images, labs for full details of the hospitalization. Admission Provider HPI:  87 y.o. female,  with medical history significant for ESRD, hypertension, diabetes mellitus, dementia, multiple myeloma.  Patient presents to ED secondary to complaints of fever, chills, altered mental status at HD center, patient reports her arm feels heavy, she reports some headache, overall she is a poor historian, she denies any chest pain, shortness of breath, does not make urine, otherwise she cannot give any specific complaints.  Patient with known history of multiple myeloma, she is on Velcade infusion biweekly. -In ED patient was noted to be febrile 101.4, tachypneic, tachycardic, chest x-ray with no acute findings, anuric unable to obtain any urine by in and out,Creatinine at 4.8, BUN at 22, she is having left upper chest HD catheter, blood cultures were sent, she was started on broad-spectrum antibiotics and [facility] hospitalist consulted to admit.  Hospital Course by problem list  Staphylococcus hominis catheter infection  - suspect from infected dialysis catheter - discussed with Dr. Marisue Humble, removed catheter after HD 10/11 - Dr. Henreitta Leber removed catheter 10/11 - nephrology formulating a plan for new cath to be placed on Monday 10/14 - discussed with ID Dr. Thedore Mins, recommendation is 2 weeks of vancomycin IV with Hemodialysis thru 12/30/22.   - 2D echocardiogram no report of vegetation -  repeated blood cultures 12/17/22 No growth to date   ESRD on HD - HD today on schedule per nephrology team - HD cath removed by Dr. Henreitta Leber on 12/18/22 - completed cath holiday, replaced dialysis catheter by IR at [facility] on 10/14  - HD 10/14 after new HD catheter placed  - DC back to SNF on 10/15    History of MM - treated by Dr. Ellin Saba outpatient    History of breast CA  - stable on anastrozole   Macrocytic Anemia  - B12, folic acid within normal limits  Discharge Diagnoses:  Principal Problem:   Gram-positive cocci bacteremia Active Problems:   GERD (gastroesophageal reflux disease)   Multiple myeloma (HCC)   Diabetes mellitus with ESRD (end-stage renal disease) (HCC)   Anemia due to end stage renal disease (HCC)   Aortic atherosclerosis (HCC)   Hyperlipidemia associated with type 2 diabetes mellitus (HCC)   ESRD on hemodialysis (HCC)   Breast cancer (HCC)   Sepsis (HCC)   Complication associated with dialysis catheter   Discharge Instructions:  Allergies as of 12/22/2022       Reactions   Ace Inhibitors Other (See Comments), Cough   Tongue swelling Angioedema    Angiotensin Receptor Blockers Other (See Comments)   Angioedema with ACE-I   Penicillins Other (See Comments)   Unknown reaction        Medication List     TAKE these medications    acetaminophen 325 MG tablet Commonly known as: TYLENOL Take 650 mg by mouth 3 (three) times daily.   acyclovir 200 MG capsule Commonly known as: ZOVIRAX Take 200 mg by mouth in the morning. (0800)   anastrozole 1  for the procedure. A timeout was performed prior to the initiation of the procedure. After creating a small venotomy incision, a micropuncture kit was utilized to access the internal jugular vein. Real-time ultrasound guidance was utilized for vascular access including the acquisition of a permanent ultrasound image documenting patency of the accessed vessel. The microwire was utilized to measure appropriate catheter length. A stiff Glidewire was advanced to the level of the IVC and the micropuncture sheath was exchanged for a peel-away sheath. A palindrome tunneled hemodialysis catheter measuring 23 cm from tip to cuff was tunneled in a retrograde fashion from the anterior chest wall to the venotomy incision. The catheter was then placed through the peel-away sheath with tips ultimately positioned within the superior aspect of the right atrium. Final  catheter positioning was confirmed and documented with a spot radiographic image. The catheter aspirates and flushes normally. The catheter was flushed with appropriate volume heparin dwells. The catheter exit site was secured with a 2-0 Ethilon retention suture. The venotomy incision was closed with Dermabond. Dressings were applied. The patient tolerated the procedure well without immediate post procedural complication. IMPRESSION: Successful placement of 23 cm tip to cuff tunneled hemodialysis catheter via the LEFT internal jugular vein The tip of the catheter is positioned at the superior cavo-atrial junction. The catheter is ready for immediate use. Roanna Banning, MD [facility] Radiology Electronically Signed   By: Roanna Banning M.D.   On: 12/21/2022 15:51   ECHOCARDIOGRAM COMPLETE  Result Date: 12/19/2022    ECHOCARDIOGRAM REPORT   Patient Name:   Carly Wood Date of Exam: 12/18/2022 Medical Rec #:  [id number]      Height:       63.0 in Accession #:    [id number]     Weight:       138.9 lb Date of Birth:  05-15-1935     BSA:          1.656 m Patient Age:    86 years       BP:           142/70 mmHg Patient Gender: F              HR:           107 bpm. Exam Location:  Inpatient Procedure: 2D Echo, Cardiac Doppler and Color Doppler Indications:    Bacteremia R78.81  History:        Patient has prior history of Echocardiogram examinations, most                 recent 09/28/2021. Signs/Symptoms:Syncope; Risk                 Factors:Hypertension, Diabetes and Dyslipidemia. ESRD, Breast                 cancer (Right) breast.  Sonographer:    Lucendia Herrlich Referring Phys: [id number] GOKUL KRISHNAN IMPRESSIONS  1. Intracavitary gradient of 98 mmHg (vel 4.9 m/s) due to hyperdynamic function.. Left ventricular ejection fraction, by estimation, is >75%. The left ventricle has hyperdynamic function. The left ventricle has no regional wall motion abnormalities. There is  moderate asymmetric left ventricular hypertrophy of the septal segment. Left ventricular diastolic parameters are indeterminate.  2. Right ventricular systolic function is normal. The right ventricular size is normal. Tricuspid regurgitation signal is inadequate for assessing PA pressure.  3. Chordal SAM. The mitral valve is degenerative. Trivial mitral valve regurgitation. No evidence of mitral stenosis. The mean  for the procedure. A timeout was performed prior to the initiation of the procedure. After creating a small venotomy incision, a micropuncture kit was utilized to access the internal jugular vein. Real-time ultrasound guidance was utilized for vascular access including the acquisition of a permanent ultrasound image documenting patency of the accessed vessel. The microwire was utilized to measure appropriate catheter length. A stiff Glidewire was advanced to the level of the IVC and the micropuncture sheath was exchanged for a peel-away sheath. A palindrome tunneled hemodialysis catheter measuring 23 cm from tip to cuff was tunneled in a retrograde fashion from the anterior chest wall to the venotomy incision. The catheter was then placed through the peel-away sheath with tips ultimately positioned within the superior aspect of the right atrium. Final  catheter positioning was confirmed and documented with a spot radiographic image. The catheter aspirates and flushes normally. The catheter was flushed with appropriate volume heparin dwells. The catheter exit site was secured with a 2-0 Ethilon retention suture. The venotomy incision was closed with Dermabond. Dressings were applied. The patient tolerated the procedure well without immediate post procedural complication. IMPRESSION: Successful placement of 23 cm tip to cuff tunneled hemodialysis catheter via the LEFT internal jugular vein The tip of the catheter is positioned at the superior cavo-atrial junction. The catheter is ready for immediate use. Roanna Banning, MD [facility] Radiology Electronically Signed   By: Roanna Banning M.D.   On: 12/21/2022 15:51   ECHOCARDIOGRAM COMPLETE  Result Date: 12/19/2022    ECHOCARDIOGRAM REPORT   Patient Name:   Carly Wood Date of Exam: 12/18/2022 Medical Rec #:  [id number]      Height:       63.0 in Accession #:    [id number]     Weight:       138.9 lb Date of Birth:  05-15-1935     BSA:          1.656 m Patient Age:    86 years       BP:           142/70 mmHg Patient Gender: F              HR:           107 bpm. Exam Location:  Inpatient Procedure: 2D Echo, Cardiac Doppler and Color Doppler Indications:    Bacteremia R78.81  History:        Patient has prior history of Echocardiogram examinations, most                 recent 09/28/2021. Signs/Symptoms:Syncope; Risk                 Factors:Hypertension, Diabetes and Dyslipidemia. ESRD, Breast                 cancer (Right) breast.  Sonographer:    Lucendia Herrlich Referring Phys: [id number] GOKUL KRISHNAN IMPRESSIONS  1. Intracavitary gradient of 98 mmHg (vel 4.9 m/s) due to hyperdynamic function.. Left ventricular ejection fraction, by estimation, is >75%. The left ventricle has hyperdynamic function. The left ventricle has no regional wall motion abnormalities. There is  moderate asymmetric left ventricular hypertrophy of the septal segment. Left ventricular diastolic parameters are indeterminate.  2. Right ventricular systolic function is normal. The right ventricular size is normal. Tricuspid regurgitation signal is inadequate for assessing PA pressure.  3. Chordal SAM. The mitral valve is degenerative. Trivial mitral valve regurgitation. No evidence of mitral stenosis. The mean  for the procedure. A timeout was performed prior to the initiation of the procedure. After creating a small venotomy incision, a micropuncture kit was utilized to access the internal jugular vein. Real-time ultrasound guidance was utilized for vascular access including the acquisition of a permanent ultrasound image documenting patency of the accessed vessel. The microwire was utilized to measure appropriate catheter length. A stiff Glidewire was advanced to the level of the IVC and the micropuncture sheath was exchanged for a peel-away sheath. A palindrome tunneled hemodialysis catheter measuring 23 cm from tip to cuff was tunneled in a retrograde fashion from the anterior chest wall to the venotomy incision. The catheter was then placed through the peel-away sheath with tips ultimately positioned within the superior aspect of the right atrium. Final  catheter positioning was confirmed and documented with a spot radiographic image. The catheter aspirates and flushes normally. The catheter was flushed with appropriate volume heparin dwells. The catheter exit site was secured with a 2-0 Ethilon retention suture. The venotomy incision was closed with Dermabond. Dressings were applied. The patient tolerated the procedure well without immediate post procedural complication. IMPRESSION: Successful placement of 23 cm tip to cuff tunneled hemodialysis catheter via the LEFT internal jugular vein The tip of the catheter is positioned at the superior cavo-atrial junction. The catheter is ready for immediate use. Roanna Banning, MD [facility] Radiology Electronically Signed   By: Roanna Banning M.D.   On: 12/21/2022 15:51   ECHOCARDIOGRAM COMPLETE  Result Date: 12/19/2022    ECHOCARDIOGRAM REPORT   Patient Name:   Carly Wood Date of Exam: 12/18/2022 Medical Rec #:  [id number]      Height:       63.0 in Accession #:    [id number]     Weight:       138.9 lb Date of Birth:  05-15-1935     BSA:          1.656 m Patient Age:    86 years       BP:           142/70 mmHg Patient Gender: F              HR:           107 bpm. Exam Location:  Inpatient Procedure: 2D Echo, Cardiac Doppler and Color Doppler Indications:    Bacteremia R78.81  History:        Patient has prior history of Echocardiogram examinations, most                 recent 09/28/2021. Signs/Symptoms:Syncope; Risk                 Factors:Hypertension, Diabetes and Dyslipidemia. ESRD, Breast                 cancer (Right) breast.  Sonographer:    Lucendia Herrlich Referring Phys: [id number] GOKUL KRISHNAN IMPRESSIONS  1. Intracavitary gradient of 98 mmHg (vel 4.9 m/s) due to hyperdynamic function.. Left ventricular ejection fraction, by estimation, is >75%. The left ventricle has hyperdynamic function. The left ventricle has no regional wall motion abnormalities. There is  moderate asymmetric left ventricular hypertrophy of the septal segment. Left ventricular diastolic parameters are indeterminate.  2. Right ventricular systolic function is normal. The right ventricular size is normal. Tricuspid regurgitation signal is inadequate for assessing PA pressure.  3. Chordal SAM. The mitral valve is degenerative. Trivial mitral valve regurgitation. No evidence of mitral stenosis. The mean

## 2022-12-23 ENCOUNTER — Non-Acute Institutional Stay (SKILLED_NURSING_FACILITY): Payer: Medicare PPO | Admitting: Adult Health

## 2022-12-23 ENCOUNTER — Encounter: Payer: Self-pay | Admitting: Adult Health

## 2022-12-23 DIAGNOSIS — E1169 Type 2 diabetes mellitus with other specified complication: Secondary | ICD-10-CM

## 2022-12-23 DIAGNOSIS — N186 End stage renal disease: Secondary | ICD-10-CM | POA: Diagnosis not present

## 2022-12-23 DIAGNOSIS — K219 Gastro-esophageal reflux disease without esophagitis: Secondary | ICD-10-CM | POA: Diagnosis not present

## 2022-12-23 DIAGNOSIS — Z992 Dependence on renal dialysis: Secondary | ICD-10-CM

## 2022-12-23 DIAGNOSIS — N2581 Secondary hyperparathyroidism of renal origin: Secondary | ICD-10-CM | POA: Diagnosis not present

## 2022-12-23 DIAGNOSIS — C50111 Malignant neoplasm of central portion of right female breast: Secondary | ICD-10-CM

## 2022-12-23 DIAGNOSIS — I700 Atherosclerosis of aorta: Secondary | ICD-10-CM | POA: Diagnosis not present

## 2022-12-23 DIAGNOSIS — E1122 Type 2 diabetes mellitus with diabetic chronic kidney disease: Secondary | ICD-10-CM

## 2022-12-23 DIAGNOSIS — E785 Hyperlipidemia, unspecified: Secondary | ICD-10-CM

## 2022-12-23 DIAGNOSIS — M810 Age-related osteoporosis without current pathological fracture: Secondary | ICD-10-CM | POA: Diagnosis not present

## 2022-12-23 DIAGNOSIS — Z170 Estrogen receptor positive status [ER+]: Secondary | ICD-10-CM

## 2022-12-23 DIAGNOSIS — F01518 Vascular dementia, unspecified severity, with other behavioral disturbance: Secondary | ICD-10-CM | POA: Diagnosis not present

## 2022-12-23 DIAGNOSIS — T80212A Local infection due to central venous catheter, initial encounter: Secondary | ICD-10-CM | POA: Diagnosis not present

## 2022-12-23 DIAGNOSIS — B9689 Other specified bacterial agents as the cause of diseases classified elsewhere: Secondary | ICD-10-CM | POA: Diagnosis not present

## 2022-12-23 DIAGNOSIS — D631 Anemia in chronic kidney disease: Secondary | ICD-10-CM

## 2022-12-23 DIAGNOSIS — G8929 Other chronic pain: Secondary | ICD-10-CM

## 2022-12-23 DIAGNOSIS — R7881 Bacteremia: Secondary | ICD-10-CM | POA: Diagnosis not present

## 2022-12-23 DIAGNOSIS — I120 Hypertensive chronic kidney disease with stage 5 chronic kidney disease or end stage renal disease: Secondary | ICD-10-CM

## 2022-12-23 DIAGNOSIS — F339 Major depressive disorder, recurrent, unspecified: Secondary | ICD-10-CM

## 2022-12-23 NOTE — Progress Notes (Signed)
Location:  [facility] Room Number: 148 Place of Service:  SNF (31)   CODE STATUS: dnr   Allergies  Allergen Reactions   Ace Inhibitors Other (See Comments) and Cough    Tongue swelling Angioedema    Angiotensin Receptor Blockers Other (See Comments)    Angioedema with ACE-I   Penicillins Other (See Comments)    Unknown reaction    Chief Complaint  Patient presents with   Hospitalization Follow-up    HPI:  She is a 87 year old long term resident of this facility who had been hospitalized from 12-16-23 through 12-22-22. She presented to the ED while at dialysis for a fever of 101.4; altered mental status and chills. She was found to have an infected dialysis access with staphylococcus hominis. She had her access removed and replaced. She did receive hemodialysis on 03-22-22. There are no reports of pain; chills. She will continue to be followed for her chronic illnesses including: Diabetes mellitus type 2 with end stage renal disease (ESRD)    Anemia due to end stage renal disease;    Post menopausal osteoporosis:    Stage 1 infiltrating ductal carcinoma of female right breast   Past Medical History:  Diagnosis Date   Anemia     chronic macrocytic anemia   Anxiety    Chronic kidney disease    Chronic renal disease, stage 4, severely decreased glomerular filtration rate (GFR) between 15-29 mL/min/1.73 square meter (HCC) 08/22/2015   Complication of anesthesia    delirious after Breast Surgery   Dementia (HCC)    mild   Depression    Diabetes mellitus with ESRD (end-stage renal disease) (HCC)    type II   Dysphagia    Dyspnea    with activity   GERD (gastroesophageal reflux disease)    Glaucoma    Hyperlipidemia    Hypertension    Multiple myeloma (HCC)    Pneumonia    Stage 1 infiltrating ductal carcinoma of right female breast (HCC) 08/21/2015   ER+ PR+ HER 2 neu + (3+) T1cN0     Past Surgical History:  Procedure Laterality Date   A/V  FISTULAGRAM Right 07/05/2020   Procedure: A/V Fistulagram;  Surgeon: Sherren Kerns, MD;  [facility]: [facility];  Service: Cardiovascular;  Laterality: Right;   AV FISTULA PLACEMENT Left 11/22/2017   Procedure: ARTERIOVENOUS (AV) FISTULA CREATION LEFT ARM;  Surgeon: Sherren Kerns, MD;  Location: [facility] OR;  Service: Vascular;  Laterality: Left;   AV FISTULA PLACEMENT Right 04/04/2020   Procedure: RIGHT ARM ARTERIOVENOUS FISTULA CREATION;  Surgeon: Larina Earthly, MD;  Location: [facility];  Service: Vascular;  Laterality: Right;   AV FISTULA PLACEMENT Right 08/20/2020   Procedure: ARTERIOVENOUS (AV) FISTULA LIGATION RIGHT ARM;  Surgeon: Sherren Kerns, MD;  Location: [facility] OR;  Service: Vascular;  Laterality: Right;   BIOPSY  08/07/2016   Procedure: BIOPSY;  Surgeon: Corbin Ade, MD;  Location: [facility];  Service: Endoscopy;;  gastric ulcer biopsy   COLONOSCOPY     ESOPHAGOGASTRODUODENOSCOPY N/A 08/07/2016   LA Grade A esophagitis s/p dilation, small hiatal hernia, multiple gastric ulcers and erosions, duodenal erosions s/p biopsy. Negative H.pylori    ESOPHAGOGASTRODUODENOSCOPY N/A 11/27/2016   normal esophagus, previously noted gastric ulcers completely healed, normal duodenum.    ESOPHAGOGASTRODUODENOSCOPY (EGD) WITH PROPOFOL N/A 07/23/2020   Procedure: ESOPHAGOGASTRODUODENOSCOPY (EGD) WITH PROPOFOL;  Surgeon: Malissa Hippo, MD;  [facility];  Service: Endoscopy;  Laterality: N/A;   FISTULA SUPERFICIALIZATION Left 02/14/2018   Procedure: FISTULA SUPERFICIALIZATION LEFT ARM;  Surgeon: Chuck Hint, MD;  Location: [facility] OR;  Service: Vascular;  Laterality: Left;   FRACTURE SURGERY Right    ankle   HOT HEMOSTASIS  07/23/2020   Procedure: HOT HEMOSTASIS (ARGON PLASMA COAGULATION/BICAP);  Surgeon: Malissa Hippo, MD;  [facility]: [facility];  Service: Endoscopy;;   INTRAMEDULLARY (IM) NAIL INTERTROCHANTERIC Right 07/12/2020   Procedure: INTRAMEDULLARY (IM)  NAIL INTERTROCHANTRIC;  Surgeon: Oliver Barre, MD;  Location: [facility];  Service: Orthopedics;  Laterality: Right;   IR FLUORO GUIDE CV LINE LEFT  12/21/2022   IR US GUIDE VASC ACCESS LEFT  12/21/2022   MALONEY DILATION N/A 08/07/2016   Procedure: Elease Hashimoto DILATION;  Surgeon: Corbin Ade, MD;  [facility]: [facility];  Service: Endoscopy;  Laterality: N/A;   MASTECTOMY, PARTIAL Right    PERIPHERAL VASCULAR BALLOON ANGIOPLASTY Left 07/13/2019   Procedure: PERIPHERAL VASCULAR BALLOON ANGIOPLASTY;  Surgeon: Cephus Shelling, MD;  [facility]: [facility];  Service: Cardiovascular;  Laterality: Left;  arm fistulogram   PERIPHERAL VASCULAR BALLOON ANGIOPLASTY Right 05/22/2020   Procedure: PERIPHERAL VASCULAR BALLOON ANGIOPLASTY;  Surgeon: Leonie Douglas, MD;  Location: [facility];  Service: Cardiovascular;  Laterality: Right;  arm fistula   PERIPHERAL VASCULAR BALLOON ANGIOPLASTY Right 07/05/2020   Procedure: PERIPHERAL VASCULAR BALLOON ANGIOPLASTY;  Surgeon: Sherren Kerns, MD;  Location: [facility];  Service: Cardiovascular;  Laterality: Right;  arm fistula   PORT-A-CATH REMOVAL Left 11/22/2017   Procedure: REMOVAL PORT-A-CATH LEFT CHEST;  Surgeon: Sherren Kerns, MD;  Location: [facility] OR;  Service: Vascular;  Laterality: Left;   RETINAL DETACHMENT SURGERY Right    SCLEROTHERAPY  07/23/2020   Procedure: SCLEROTHERAPY;  Surgeon: Malissa Hippo, MD;  [facility]: [facility];  Service: Endoscopy;;   UPPER EXTREMITY VENOGRAPHY N/A 08/22/2020   Procedure: LEFT UPPER & CENTRAL VENOGRAPHY;  Surgeon: Cephus Shelling, MD;  [facility]: [facility];  Service: Cardiovascular;  Laterality: N/A;    Social History   Socioeconomic History   Marital status: Single    Spouse name: Not on file   Number of children: Not on file   Years of education: Not on file   Highest education level: Not on file  Occupational History   Occupation: retired   Tobacco Use    Smoking status: Never   Smokeless tobacco: Never  Vaping Use   Vaping status: Never Used  Substance and Sexual Activity   Alcohol use: No    Alcohol/week: 0.0 standard drinks of alcohol   Drug use: No   Sexual activity: Never  Other Topics Concern   Not on file  Social History Narrative   Long term resident of SNF    Social Determinants of Health   Financial Resource Strain: Low Risk  (01/09/2020)   Overall Financial Resource Strain (CARDIA)    Difficulty of Paying Living Expenses: Not very hard  Food Insecurity: No Food Insecurity (12/16/2022)   Hunger Vital Sign    Worried About Running Out of Food in the Last Year: Never true    Ran Out of Food in the Last Year: Never true  Transportation Needs: No Transportation Needs (12/16/2022)   PRAPARE - Administrator, Civil Service (Medical): No    Lack of Transportation (Non-Medical): No  Physical Activity: Inactive (01/09/2020)   Exercise Vital Sign    Days of Exercise per Week:  0 days    Minutes of Exercise per Session: 0 min  Stress: No Stress Concern Present (01/09/2020)   [employer] of Occupational Health - Occupational Stress Questionnaire    Feeling of Stress : Not at all  Social Connections: Moderately Isolated (01/09/2020)   Social Connection and Isolation Panel [NHANES]    Frequency of Communication with Friends and Family: More than three times a week    Frequency of Social Gatherings with Friends and Family: Once a week    Attends Religious Services: More than 4 times per year    Active Member of [employer] or Organizations: No    Attends Banker Meetings: Never    Marital Status: Never married  Intimate Partner Violence: Not At Risk (12/16/2022)   Humiliation, Afraid, Rape, and Kick questionnaire    Fear of Current or Ex-Partner: No    Emotionally Abused: No    Physically Abused: No    Sexually Abused: No   Family History  Problem Relation Age of Onset   Multiple myeloma Sister    Brain  cancer Sister    Dementia Mother        died at 102   Stroke Mother    Heart failure Mother    Diabetes Mother    Heart disease Father    Prostate cancer Brother    Colon cancer Neg Hx       VITAL SIGNS BP 132/78   Pulse 74   Temp (!) 97.4 F (36.3 C)   Resp 20   Ht 5\' 3"  (1.6 m)   Wt 139 lb (63 kg)   SpO2 95%   BMI 24.62 kg/m   Outpatient Encounter Medications as of 12/23/2022  Medication Sig   acetaminophen (TYLENOL) 325 MG tablet Take 650 mg by mouth 3 (three) times daily.   acyclovir (ZOVIRAX) 200 MG capsule Take 200 mg by mouth in the morning. (0800)   anastrozole (ARIMIDEX) 1 MG tablet TAKE 1 TABLET BY MOUTH DAILY   Calcium Carb-Cholecalciferol (CALCIUM + VITAMIN D3) 500-5 MG-MCG TABS Take 1 tablet by mouth daily.   melatonin 5 MG TABS Take 5 mg by mouth at bedtime.   multivitamin (RENA-VIT) TABS tablet Take 1 tablet by mouth at bedtime.   Nutritional Supplements (ENSURE ENLIVE PO) Take 237 mLs by mouth every evening.   ondansetron (ZOFRAN-ODT) 4 MG disintegrating tablet Take 4 mg by mouth daily before breakfast.   pantoprazole (PROTONIX) 40 MG tablet Take 40 mg by mouth 2 (two) times daily.   sennosides-docusate sodium (SENOKOT-S) 8.6-50 MG tablet Take 1 tablet by mouth 2 (two) times daily.   sertraline (ZOLOFT) 25 MG tablet Take 25 mg by mouth daily.   sucralfate (CARAFATE) 1 GM/10ML suspension Take 1 g by mouth in the morning, at noon, in the evening, and at bedtime.   vancomycin (VANCOREADY) 750 MG/150ML SOLN Inject 150 mLs (750 mg total) into the vein every Monday, Wednesday, and Friday with hemodialysis for 8 days.   Facility-Administered Encounter Medications as of 12/23/2022  Medication   lanreotide acetate (SOMATULINE DEPOT) 120 MG/0.5ML injection   lanreotide acetate (SOMATULINE DEPOT) 120 MG/0.5ML injection   lanreotide acetate (SOMATULINE DEPOT) 120 MG/0.5ML injection   lanreotide acetate (SOMATULINE DEPOT) 120 MG/0.5ML injection   octreotide  (SANDOSTATIN LAR) 30 MG IM injection   octreotide (SANDOSTATIN LAR) 30 MG IM injection     SIGNIFICANT DIAGNOSTIC EXAMS  PREVIOUS  10-24-21: DEXA: t score -2.957  NO NEW EXAMS    LABS REVIEWED PREVIOUS  12-16-21: wbc 5.3; hgb 10.1; hct 33.0 mcv 98.5 plt 256; glucose 119; bun 24; creat 6.62; k+ 4.2 na++ 136; ca 9.3 gfr 6; protein 6.4 albumin 3.5; iron 54; tibc 140; ferritin 272  02-03-22: wbc 3.8; hgb 11.5; hct 37.6; mcv 96.4 plt 212; glucose 164; bun 28; creat 7.99; k+ 4.4; na++ 139; ca 8.3 gfr 5; protein 6.7 albumin 3.7 mag 2.1 02-17-22: wbc 4.6; hgb 11.5; hct 36.5; mcv 93.6 plt 207; glucose 95; bun 23 creat 7.44 k+ 4.1; na++ 135; ca 7.8; gfr 5 protein 6.9; albumin 3.8  03-31-22: wbc 4.7; hgb 10.0; hct 32.2; mcv 96.4 plt 278; glucose 155; bun 28; creat 6.50; k+ 4.0; na++ 132; ca 8.0; gfr 6 protein 6.9 albumin 3.6 05-19-22: wbc 4.1; hgb 9.6; hct 30.9; mcv 98.7 plt 236; glucose 170; bun 33; creat 6.03; k+ 3.9; na++ 137; ca 8.1; gfr 6; protein 6.6 albumin 3.4; mag 2.1  06-23-22: wbc 4.9; hgb 9.1; hct 28.7 mcv 101.4 plt 286; glucose 158; bun 25; creat 5.85; k+ 3.9 na++ 136; ca 8.6 gfr 7 protein 6.8 albumin 3.3 mag 2.2 07-02-22: hgb A1c 5.1 07-07-22: wbc 4.9; hgb 10.3; hct 32.8; mcv 102.5 plt 265; glucose 141; bun 32; creat 6.15; k+ 4.3; na++ 136; ca 8.9; gfr 6 protein 7.2 albumin 3.7; mag 2.3 07-20-22: vitamin B 12: 660; tsh 1.836 08-04-22: wbc 4.6; hgb 10.7; ht 33.1; mcv 100.3 plt 234; glucose 120; bun 41; creat 6.46; k+ 4.2; na++ 135; ca 9.8; gfr 6; protein 7.3; albumin 3.7 mag 2.4  LABS REVIEWED:   09-15-22: wbc 5.2; hgb 9.9; hct 31.0; mcv 103.3 plt 224; glucose 132; bun 22; creat 6.19; k+ 4.2; na++ 135; ca 9.5 gfr 6 protein 6.9 albumin 3.7     11-10-22: wbc 4.0; hgb 10.7; hct 33.4; mcv 102.1 plt 187; glucose 110; bun 28; creat 6.16; k+ 4.8; na++ 130; ca 9.0; gfr 6; protein 6.8 albumin 3.6 mag 2.4  12-16-22: wbc 6.7; hgb 12.4; hct 38.3; mcv 101.3 plt 201; glucose 123; bun 22; creat 4.81; k+ 4.1;  na++ 133; ca 9.4; gfr 8; protein 7.9; albumin 4.1; blood culture: staphylococcus hominis  12-22-22: wbc 4.2; hgb 9.8; hct 30.1; mcv 100.0; plt 184; glucose 90; bun 81; creat 12.02; k+ 5.0; na++ 136; ca 8.8; gfr 3; phos 10.8; albumin 3.2   Review of Systems  Constitutional:  Negative for malaise/fatigue.  Respiratory:  Negative for cough and shortness of breath.   Cardiovascular:  Negative for chest pain, palpitations and leg swelling.  Gastrointestinal:  Negative for abdominal pain, constipation and heartburn.  Musculoskeletal:  Negative for back pain, joint pain and myalgias.  Skin: Negative.   Neurological:  Negative for dizziness.  Psychiatric/Behavioral:  The patient is not nervous/anxious.     Physical Exam Constitutional:      General: She is not in acute distress.    Appearance: She is well-developed. She is not diaphoretic.  Neck:     Thyroid: No thyromegaly.  Cardiovascular:     Rate and Rhythm: Normal rate and regular rhythm.     Pulses: Normal pulses.     Heart sounds: Normal heart sounds.  Pulmonary:     Effort: Pulmonary effort is normal. No respiratory distress.     Breath sounds: Normal breath sounds.  Abdominal:     General: Bowel sounds are normal. There is no distension.     Palpations: Abdomen is soft.     Tenderness: There is no abdominal tenderness.  Musculoskeletal:     Cervical  back: Neck supple.     Right lower leg: No edema.     Left lower leg: No edema.     Comments: Able to move all extremities   Lymphadenopathy:     Cervical: No cervical adenopathy.  Skin:    General: Skin is warm and dry.     Comments: Chest wall dialysis access   Neurological:     Mental Status: She is alert. Mental status is at baseline.  Psychiatric:        Mood and Affect: Mood normal.          ASSESSMENT/PLAN  TODAY  Gram positive cocci bacteremia: will continue vancomycin IV while at dialysis through 12-30-22.   Diabetes mellitus type 2 with end stage renal  disease (ESRD) hgb A1c 5.1 is currently off medications  3. Anemia due to end stage renal disease; hgb 9.8  4. Post menopausal osteoporosis: t score -2.957 will continue supplements   5. Stage 1 infiltrating ductal carcinoma of female right breast/status post right mastectomy will continue arimidex 1 mg daily  6. Multiple myeloma not having achieved remission: is followed by oncology    7. Herpes: without outbreak will continue acyclovir 200 mg daily   8. Major depression chronic recurrent: will continue zoloft 50 mg daily  9. Protein calorie malnutrition: protein 7.9 albumin 3.2 will continue supplements and prostat with meals.   10. Multiple gastric ulcers/duodenal ulcer/upper GI bleed/esophageal reflux disease without esophagitis: will continue protonix 40 mg twice daily carafate 1 gm with before meals and bedtime. Zofran 4 mg daily and every 6 hours as needed    11. Generalized chronic pain: will continue tylenol 650 mg three times daily   12. Chronic kidney disease with end stage renal disease on dialysis due to type 2 diabetes mellitus/dependence on dialysis: will continue dialysis three days per week: 1200 cc fluid restriction;   13. Hypertension associated with end stage renal disease (ESRD) due to type 2 diabetes mellitus: b/p 132/78 will monitor   14. Hyperlipidemia associated with type 2 diabetes mellitus: ldl 89; is off lipitor; had elevated liver enzymes.   15. Aortic atherosclerosis/atherosclerotic peripheral vascular disease (ct 10-04-18) ldl 89  16. Vascular dementia with behavioral disturbance: weight is 139 pounds and stable.    Synthia Innocent NP [facility]  call [phone]

## 2022-12-25 DIAGNOSIS — N2581 Secondary hyperparathyroidism of renal origin: Secondary | ICD-10-CM | POA: Diagnosis not present

## 2022-12-25 DIAGNOSIS — N186 End stage renal disease: Secondary | ICD-10-CM | POA: Diagnosis not present

## 2022-12-25 DIAGNOSIS — Z992 Dependence on renal dialysis: Secondary | ICD-10-CM | POA: Diagnosis not present

## 2022-12-25 DIAGNOSIS — T80212A Local infection due to central venous catheter, initial encounter: Secondary | ICD-10-CM | POA: Diagnosis not present

## 2022-12-25 DIAGNOSIS — B9689 Other specified bacterial agents as the cause of diseases classified elsewhere: Secondary | ICD-10-CM | POA: Diagnosis not present

## 2022-12-28 DIAGNOSIS — Z992 Dependence on renal dialysis: Secondary | ICD-10-CM | POA: Diagnosis not present

## 2022-12-28 DIAGNOSIS — N186 End stage renal disease: Secondary | ICD-10-CM | POA: Diagnosis not present

## 2022-12-28 DIAGNOSIS — T80212A Local infection due to central venous catheter, initial encounter: Secondary | ICD-10-CM | POA: Diagnosis not present

## 2022-12-28 DIAGNOSIS — N2581 Secondary hyperparathyroidism of renal origin: Secondary | ICD-10-CM | POA: Diagnosis not present

## 2022-12-28 DIAGNOSIS — B9689 Other specified bacterial agents as the cause of diseases classified elsewhere: Secondary | ICD-10-CM | POA: Diagnosis not present

## 2022-12-30 ENCOUNTER — Other Ambulatory Visit: Payer: Self-pay

## 2022-12-30 DIAGNOSIS — N2581 Secondary hyperparathyroidism of renal origin: Secondary | ICD-10-CM | POA: Diagnosis not present

## 2022-12-30 DIAGNOSIS — T80212A Local infection due to central venous catheter, initial encounter: Secondary | ICD-10-CM | POA: Diagnosis not present

## 2022-12-30 DIAGNOSIS — N186 End stage renal disease: Secondary | ICD-10-CM | POA: Diagnosis not present

## 2022-12-30 DIAGNOSIS — Z992 Dependence on renal dialysis: Secondary | ICD-10-CM | POA: Diagnosis not present

## 2022-12-30 DIAGNOSIS — B9689 Other specified bacterial agents as the cause of diseases classified elsewhere: Secondary | ICD-10-CM | POA: Diagnosis not present

## 2022-12-31 ENCOUNTER — Non-Acute Institutional Stay (SKILLED_NURSING_FACILITY): Payer: Medicare PPO | Admitting: Adult Health

## 2022-12-31 ENCOUNTER — Encounter: Payer: Self-pay | Admitting: Adult Health

## 2022-12-31 DIAGNOSIS — Z0000 Encounter for general adult medical examination without abnormal findings: Secondary | ICD-10-CM | POA: Diagnosis not present

## 2022-12-31 NOTE — Progress Notes (Signed)
Subjective:   Carly Wood is a 87 y.o. female who presents for Medicare Annual (Subsequent) preventive examination.  Visit Complete: In person  Patient Medicare AWV questionnaire was completed by the patient on 12-31-22; I have confirmed that all information answered by patient is correct and no changes since this date.  Cardiac Risk Factors include: advanced age (>81men, >21 women);diabetes mellitus;microalbuminuria;sedentary lifestyle     Objective:    Today's Vitals   12/31/22 1137 12/31/22 1139  BP: 134/70   Pulse: 78   Resp: 20   Temp: 98.6 F (37 C)   SpO2: 96%   Weight: 142 lb 12.8 oz (64.8 kg)   Height: 5\' 3"  (1.6 m)   PainSc:  0-No pain   Body mass index is 25.3 kg/m.     12/16/2022   10:50 PM 12/16/2022    5:28 PM 11/10/2022    2:17 PM 10/27/2022    4:17 PM 10/13/2022    3:18 PM 09/29/2022    1:13 PM 08/20/2022    2:44 PM  Advanced Directives  Does Patient Have a Medical Advance Directive? No No Yes Yes Yes Yes Yes  Type of Advance Directive   Living will;Healthcare Power of Attorney Living will;Healthcare Power of Attorney Living will;Healthcare Power of [employer] Power of [place];Living will Healthcare Power of [place];Out of facility DNR (pink MOST or yellow form)  Does patient want to make changes to medical advance directive?    No - Patient declined No - Patient declined  No - Patient declined  Copy of Healthcare Power of Attorney in Chart?   No - copy requested   No - copy requested Yes - validated most recent copy scanned in chart (See row information)  Would patient like information on creating a medical advance directive? Yes (ED - Information included in AVS) Yes (ED - Information included in AVS) No - Patient declined No - Patient declined No - Patient declined No - Patient declined   Pre-existing out of facility DNR order (yellow form or pink MOST form)   Yellow form placed in chart (order not valid for inpatient use)    Yellow form placed in  chart (order not valid for inpatient use)    Current Medications (verified) Outpatient Encounter Medications as of 12/31/2022  Medication Sig   acetaminophen (TYLENOL) 325 MG tablet Take 650 mg by mouth 3 (three) times daily.   acyclovir (ZOVIRAX) 200 MG capsule Take 200 mg by mouth in the morning. (0800)   anastrozole (ARIMIDEX) 1 MG tablet TAKE 1 TABLET BY MOUTH DAILY   Calcium Carb-Cholecalciferol (CALCIUM + VITAMIN D3) 500-5 MG-MCG TABS Take 1 tablet by mouth daily.   melatonin 5 MG TABS Take 5 mg by mouth at bedtime.   multivitamin (RENA-VIT) TABS tablet Take 1 tablet by mouth at bedtime.   Nutritional Supplements (ENSURE ENLIVE PO) Take 237 mLs by mouth every evening.   ondansetron (ZOFRAN-ODT) 4 MG disintegrating tablet Take 4 mg by mouth daily before breakfast.   pantoprazole (PROTONIX) 40 MG tablet Take 40 mg by mouth 2 (two) times daily.   sennosides-docusate sodium (SENOKOT-S) 8.6-50 MG tablet Take 1 tablet by mouth 2 (two) times daily.   sertraline (ZOLOFT) 25 MG tablet Take 25 mg by mouth daily.   sucralfate (CARAFATE) 1 GM/10ML suspension Take 1 g by mouth in the morning, at noon, in the evening, and at bedtime.   Facility-Administered Encounter Medications as of 12/31/2022  Medication   lanreotide acetate (SOMATULINE DEPOT) 120 MG/0.5ML injection  lanreotide acetate (SOMATULINE DEPOT) 120 MG/0.5ML injection   lanreotide acetate (SOMATULINE DEPOT) 120 MG/0.5ML injection   lanreotide acetate (SOMATULINE DEPOT) 120 MG/0.5ML injection   octreotide (SANDOSTATIN LAR) 30 MG IM injection   octreotide (SANDOSTATIN LAR) 30 MG IM injection    Allergies (verified) Ace inhibitors, Angiotensin receptor blockers, and Penicillins   History: Past Medical History:  Diagnosis Date   Anemia     chronic macrocytic anemia   Anxiety    Chronic kidney disease    Chronic renal disease, stage 4, severely decreased glomerular filtration rate (GFR) between 15-29 mL/min/1.73 square meter  (HCC) 08/22/2015   Complication of anesthesia    delirious after Breast Surgery   Dementia (HCC)    mild   Depression    Diabetes mellitus with ESRD (end-stage renal disease) (HCC)    type II   Dysphagia    Dyspnea    with activity   GERD (gastroesophageal reflux disease)    Glaucoma    Hyperlipidemia    Hypertension    Multiple myeloma (HCC)    Pneumonia    Stage 1 infiltrating ductal carcinoma of right female breast (HCC) 08/21/2015   ER+ PR+ HER 2 neu + (3+) T1cN0    Past Surgical History:  Procedure Laterality Date   A/V FISTULAGRAM Right 07/05/2020   Procedure: A/V Fistulagram;  Surgeon: Sherren Kerns, MD;  [facility];  Service: Cardiovascular;  Laterality: Right;   AV FISTULA PLACEMENT Left 11/22/2017   Procedure: ARTERIOVENOUS (AV) FISTULA CREATION LEFT ARM;  Surgeon: Sherren Kerns, MD;  [facility];  Service: Vascular;  Laterality: Left;   AV FISTULA PLACEMENT Right 04/04/2020   Procedure: RIGHT ARM ARTERIOVENOUS FISTULA CREATION;  Surgeon: Larina Earthly, MD;  Location: [facility];  Service: Vascular;  Laterality: Right;   AV FISTULA PLACEMENT Right 08/20/2020   Procedure: ARTERIOVENOUS (AV) FISTULA LIGATION RIGHT ARM;  Surgeon: Sherren Kerns, MD;  Location: [facility] OR;  Service: Vascular;  Laterality: Right;   BIOPSY  08/07/2016   Procedure: BIOPSY;  Surgeon: Corbin Ade, MD;  [facility];  Service: Endoscopy;;  gastric ulcer biopsy   COLONOSCOPY     ESOPHAGOGASTRODUODENOSCOPY N/A 08/07/2016   LA Grade A esophagitis s/p dilation, small hiatal hernia, multiple gastric ulcers and erosions, duodenal erosions s/p biopsy. Negative H.pylori    ESOPHAGOGASTRODUODENOSCOPY N/A 11/27/2016   normal esophagus, previously noted gastric ulcers completely healed, normal duodenum.    ESOPHAGOGASTRODUODENOSCOPY (EGD) WITH PROPOFOL N/A 07/23/2020   Procedure: ESOPHAGOGASTRODUODENOSCOPY (EGD) WITH PROPOFOL;  Surgeon: Malissa Hippo, MD;   [facility];  Service: Endoscopy;  Laterality: N/A;   FISTULA SUPERFICIALIZATION Left 02/14/2018   Procedure: FISTULA SUPERFICIALIZATION LEFT ARM;  Surgeon: Chuck Hint, MD;  Location[facility] OR;  Service: Vascular;  Laterality: Left;   FRACTURE SURGERY Right    ankle   HOT HEMOSTASIS  07/23/2020   Procedure: HOT HEMOSTASIS (ARGON PLASMA COAGULATION/BICAP);  Surgeon: Malissa Hippo, MD;  [facility];  Service: Endoscopy;;   INTRAMEDULLARY (IM) NAIL INTERTROCHANTERIC Right 07/12/2020   Procedure: INTRAMEDULLARY (IM) NAIL INTERTROCHANTRIC;  Surgeon: Oliver Barre, MD;  Location: [facility];  Service: Orthopedics;  Laterality: Right;   IR FLUORO GUIDE CV LINE LEFT  12/21/2022   IR US GUIDE VASC ACCESS LEFT  12/21/2022   MALONEY DILATION N/A 08/07/2016   Procedure: Elease Hashimoto DILATION;  Surgeon: Corbin Ade, MD;  [facility];  Service: Endoscopy;  Laterality: N/A;  MASTECTOMY, PARTIAL Right    PERIPHERAL VASCULAR BALLOON ANGIOPLASTY Left 07/13/2019   Procedure: PERIPHERAL VASCULAR BALLOON ANGIOPLASTY;  Surgeon: Cephus Shelling, MD;  [facility];  Service: Cardiovascular;  Laterality: Left;  arm fistulogram   PERIPHERAL VASCULAR BALLOON ANGIOPLASTY Right 05/22/2020   Procedure: PERIPHERAL VASCULAR BALLOON ANGIOPLASTY;  Surgeon: Leonie Douglas, MD;  [facility];  Service: Cardiovascular;  Laterality: Right;  arm fistula   PERIPHERAL VASCULAR BALLOON ANGIOPLASTY Right 07/05/2020   Procedure: PERIPHERAL VASCULAR BALLOON ANGIOPLASTY;  Surgeon: Sherren Kerns, MD;  [facility];  Service: Cardiovascular;  Laterality: Right;  arm fistula   PORT-A-CATH REMOVAL Left 11/22/2017   Procedure: REMOVAL PORT-A-CATH LEFT CHEST;  Surgeon: Sherren Kerns, MD;  Location: [facility] OR;  Service: Vascular;  Laterality: Left;   RETINAL DETACHMENT SURGERY Right    SCLEROTHERAPY  07/23/2020   Procedure: SCLEROTHERAPY;   Surgeon: Malissa Hippo, MD;  [facility];  Service: Endoscopy;;   UPPER EXTREMITY VENOGRAPHY N/A 08/22/2020   Procedure: LEFT UPPER & CENTRAL VENOGRAPHY;  Surgeon: Cephus Shelling, MD;  [facility];  Service: Cardiovascular;  Laterality: N/A;   Family History  Problem Relation Age of Onset   Multiple myeloma Sister    Brain cancer Sister    Dementia Mother        died at 63   Stroke Mother    Heart failure Mother    Diabetes Mother    Heart disease Father    Prostate cancer Brother    Colon cancer Neg Hx    Social History   Socioeconomic History   Marital status: Single    Spouse name: Not on file   Number of children: Not on file   Years of education: Not on file   Highest education level: Not on file  Occupational History   Occupation: retired   Tobacco Use   Smoking status: Never   Smokeless tobacco: Never  Vaping Use   Vaping status: Never Used  Substance and Sexual Activity   Alcohol use: No    Alcohol/week: 0.0 standard drinks of alcohol   Drug use: No   Sexual activity: Never  Other Topics Concern   Not on file  Social History Narrative   Long term resident of SNF    Social Determinants of Health   Financial Resource Strain: Low Risk  (01/09/2020)   Overall Financial Resource Strain (CARDIA)    Difficulty of Paying Living Expenses: Not very hard  Food Insecurity: No Food Insecurity (12/16/2022)   Hunger Vital Sign    Worried About Running Out of Food in the Last Year: Never true    Ran Out of Food in the Last Year: Never true  Transportation Needs: No Transportation Needs (12/16/2022)   PRAPARE - Administrator, Civil Service (Medical): No    Lack of Transportation (Non-Medical): No  Physical Activity: Inactive (01/09/2020)   Exercise Vital Sign    Days of Exercise per Week: 0 days    Minutes of Exercise per Session: 0 min  Stress: No Stress Concern Present (01/09/2020)   [employer] of Occupational  Health - Occupational Stress Questionnaire    Feeling of Stress : Not at all  Social Connections: Moderately Isolated (01/09/2020)   Social Connection and Isolation Panel [NHANES]    Frequency of Communication with Friends and Family: More than three times a week    Frequency of Social Gatherings with Friends  and Family: Once a week    Attends Religious Services: More than 4 times per year    Active Member of Clubs or Organizations: No    Attends Engineer, structural: Never    Marital Status: Never married    Tobacco Counseling Counseling given: Not Answered   Clinical Intake:  Pre-visit preparation completed: Yes  Pain : No/denies pain Pain Score: 0-No pain     BMI - recorded: 25.3 Nutritional Status: BMI 25 -29 Overweight Nutritional Risks: Unintentional weight loss Diabetes: Yes CBG done?: Yes CBG resulted in Enter/ Edit results?: Yes Did pt. bring in CBG monitor from home?: No  How often do you need to have someone help you when you read instructions, pamphlets, or other written materials from your doctor or pharmacy?: 5 - Always  Interpreter Needed?: No      Activities of Daily Living    12/31/2022   11:41 AM 12/16/2022   10:50 PM  In your present state of health, do you have any difficulty performing the following activities:  Hearing? 0 0  Vision? 0 0  Difficulty concentrating or making decisions? 1 1  Walking or climbing stairs? 1   Dressing or bathing? 1   Doing errands, shopping? 1 1  Preparing Food and eating ? Y   Using the Toilet? Y   In the past six months, have you accidently leaked urine? Y   Do you have problems with loss of bowel control? Y   Managing your Medications? Y   Managing your Finances? Y   Housekeeping or managing your Housekeeping? Y     Patient Care Team: Sharee Holster, NP as PCP - General (Geriatric Medicine) [name] Gerrit Friends, MD as Consulting Physician (Gastroenterology) [name], MD as Medical  Oncologist (Medical Oncology) Salomon Mast, MD (Inactive) as Consulting Physician (Nephrology) [facility]  Indicate any recent Medical Services you may have received from other than [facility] providers in the past year (date may be approximate).     Assessment:   This is a routine wellness examination for Carly Wood.  Hearing/Vision screen No results found.   Goals Addressed             This Visit's Progress    DIET - INCREASE WATER INTAKE   On track    Follow up with Provider as scheduled   On track    General - Client will not be readmitted within 30 days ([facility])   Not on track      Depression Screen    12/31/2022   11:42 AM 03/13/2022    1:06 PM 12/19/2021   12:04 PM 03/27/2021   12:36 PM 03/07/2021   12:43 PM 12/17/2020   10:08 AM 01/09/2020    2:45 PM  PHQ 2/9 Scores  PHQ - 2 Score 0 0 0 0 0 0 0  PHQ- 9 Score  0  0       Fall Risk    12/31/2022   11:42 AM 12/19/2021   12:01 PM 12/19/2021    8:44 AM 12/17/2021   11:58 AM 03/27/2021   12:36 PM  Fall Risk   Falls in the past year? 0 1 0 0 1  Number falls in past yr: 0 1 0 0 1  Injury with Fall? 0 0 0 0 1  Risk for fall due to : Impaired balance/gait;Impaired mobility History of fall(s);Impaired balance/gait;Impaired mobility No Fall Risks No Fall Risks History of fall(s);Impaired balance/gait;Impaired mobility  Follow up  Falls evaluation completed Falls evaluation completed Falls evaluation completed Falls evaluation completed Falls evaluation completed    MEDICARE RISK AT HOME: Medicare Risk at Home Any stairs in or around the home?: Yes If so, are there any without handrails?: No Home free of loose throw rugs in walkways, pet beds, electrical cords, etc?: No Adequate lighting in your home to reduce risk of falls?: Yes Life alert?: No Use of a cane, walker or w/c?: Yes Grab bars in the bathroom?: Yes Shower chair or bench in shower?: Yes Elevated toilet seat or a  handicapped toilet?: Yes  TIMED UP AND GO:  Was the test performed?  No    Cognitive Function:    12/31/2022   11:42 AM 12/19/2021   12:05 PM 12/17/2020   10:12 AM  MMSE - Mini Mental State Exam  Not completed: Unable to complete Unable to complete Unable to complete        12/19/2021   12:05 PM 11/08/2019   11:04 AM  6CIT Screen  What Year? 0 points 0 points  What month? 3 points 0 points  What time? 3 points 3 points  Count back from 20 2 points 2 points  Months in reverse 4 points 2 points  Repeat phrase 6 points 2 points  Total Score 18 points 9 points    Immunizations Immunization History  Administered Date(s) Administered   Fluad Quad(high Dose 65+) 11/22/2018, 12/09/2021   Hepatitis B, ADULT 02/28/2020   Influenza Inj Mdck Quad Pf 12/20/2014   Influenza,inj,Quad PF,6+ Mos 11/22/2015, 12/11/2020   Influenza,inj,quad, With Preservative 12/20/2014, 12/31/2016   Influenza-Unspecified 11/22/2018, 12/15/2019, 12/09/2021   Moderna Covid-19 Vaccine Bivalent Booster 85yrs & up 12/31/2020   Moderna SARS-COV2 Booster Vaccination 06/19/2020, 10/23/2020, 07/29/2021, 12/31/2021   Moderna Sars-Covid-2 Vaccination 05/11/2019, 06/05/2019, 01/11/2020   PNEUMOCOCCAL CONJUGATE-20 12/13/2020   PPD Test 05/03/2019, 06/23/2019, 08/16/2019, 06/27/2020, 07/13/2020   Pneumococcal Conjugate-13 12/20/2014   Pneumococcal Polysaccharide-23 12/06/2015   Pneumococcal-Unspecified 12/20/2014   Rsv, Bivalent, Protein Subunit Rsvpref,pf Verdis Frederickson) 02/09/2022   Tdap 01/16/2021   Zoster Recombinant(Shingrix) 11/05/2009, 04/01/2021    TDAP status: Up to date  Flu Vaccine status: Up to date  Pneumococcal vaccine status: Up to date  Covid-19 vaccine status: Completed vaccines  Qualifies for Shingles Vaccine? Yes   Zostavax completed Yes   Shingrix Completed?: No.    Education has been provided regarding the importance of this vaccine. Patient has been advised to call insurance company to  determine out of pocket expense if they have not yet received this vaccine. Advised may also receive vaccine at local pharmacy or Health Dept. Verbalized acceptance and understanding.  Screening Tests Health Maintenance  Topic Date Due   OPHTHALMOLOGY EXAM  02/12/2022   FOOT EXAM  09/16/2022   COVID-19 Vaccine (5 - 2023-24 season) 11/08/2022   Medicare Annual Wellness (AWV)  12/20/2022   INFLUENZA VACCINE  03/09/2023 (Originally 10/08/2022)   HEMOGLOBIN A1C  05/23/2023   DTaP/Tdap/Td (2 - Td or Tdap) 01/17/2031   Pneumonia Vaccine 85+ Years old  Completed   DEXA SCAN  Completed   Zoster Vaccines- Shingrix  Completed   HPV VACCINES  Aged Out    Health Maintenance  Health Maintenance Due  Topic Date Due   OPHTHALMOLOGY EXAM  02/12/2022   FOOT EXAM  09/16/2022   COVID-19 Vaccine (5 - 2023-24 season) 11/08/2022   Medicare Annual Wellness (AWV)  12/20/2022    Colorectal cancer screening: No longer required.   Mammogram status: No longer required due to age.  Bone Density status: Completed 10-24-21. Results reflect: Bone density results: OSTEOPOROSIS. Repeat every 2 years.  Lung Cancer Screening: (Low Dose CT Chest recommended if Age 23-80 years, 20 pack-year currently smoking OR have quit w/in 15years.) does not qualify.   Lung Cancer Screening Referral:   Additional Screening:  Hepatitis C Screening: does not qualify; Completed   Vision Screening: Recommended annual ophthalmology exams for early detection of glaucoma and other disorders of the eye. Is the patient up to date with their annual eye exam?  No  Who is the provider or what is the name of the office in which the patient attends annual eye exams?  If pt is not established with a provider, would they like to be referred to a provider to establish care? No .   Dental Screening: Recommended annual dental exams for proper oral hygiene  Diabetic Foot Exam: completed 12-31-22  Community Resource Referral / Chronic Care  Management: CRR required this visit?  No   CCM required this visit?  No     Plan:     I have personally reviewed and noted the following in the patient's chart:   Medical and social history Use of alcohol, tobacco or illicit drugs  Current medications and supplements including opioid prescriptions. Patient is not currently taking opioid prescriptions. Functional ability and status Nutritional status Physical activity Advanced directives List of other physicians Hospitalizations, surgeries, and ER visits in previous 12 months Vitals Screenings to include cognitive, depression, and falls Referrals and appointments  In addition, I have reviewed and discussed with patient certain preventive protocols, quality metrics, and best practice recommendations. A written personalized care plan for preventive services as well as general preventive health recommendations were provided to patient.     Sharee Holster, NP   12/31/2022   After Visit Summary: (In Person-Declined) Patient declined AVS at this time.  Nurse Notes: this exam was performed by myself at this facility

## 2023-01-01 DIAGNOSIS — Z992 Dependence on renal dialysis: Secondary | ICD-10-CM | POA: Diagnosis not present

## 2023-01-01 DIAGNOSIS — N186 End stage renal disease: Secondary | ICD-10-CM | POA: Diagnosis not present

## 2023-01-01 DIAGNOSIS — T80212A Local infection due to central venous catheter, initial encounter: Secondary | ICD-10-CM | POA: Diagnosis not present

## 2023-01-01 DIAGNOSIS — B9689 Other specified bacterial agents as the cause of diseases classified elsewhere: Secondary | ICD-10-CM | POA: Diagnosis not present

## 2023-01-01 DIAGNOSIS — N2581 Secondary hyperparathyroidism of renal origin: Secondary | ICD-10-CM | POA: Diagnosis not present

## 2023-01-04 DIAGNOSIS — T80212A Local infection due to central venous catheter, initial encounter: Secondary | ICD-10-CM | POA: Diagnosis not present

## 2023-01-04 DIAGNOSIS — N2581 Secondary hyperparathyroidism of renal origin: Secondary | ICD-10-CM | POA: Diagnosis not present

## 2023-01-04 DIAGNOSIS — B9689 Other specified bacterial agents as the cause of diseases classified elsewhere: Secondary | ICD-10-CM | POA: Diagnosis not present

## 2023-01-04 DIAGNOSIS — N186 End stage renal disease: Secondary | ICD-10-CM | POA: Diagnosis not present

## 2023-01-04 DIAGNOSIS — Z992 Dependence on renal dialysis: Secondary | ICD-10-CM | POA: Diagnosis not present

## 2023-01-05 ENCOUNTER — Inpatient Hospital Stay: Payer: Medicare PPO

## 2023-01-05 ENCOUNTER — Inpatient Hospital Stay: Payer: Medicare PPO | Attending: Hematology

## 2023-01-05 ENCOUNTER — Inpatient Hospital Stay (HOSPITAL_BASED_OUTPATIENT_CLINIC_OR_DEPARTMENT_OTHER): Payer: Medicare PPO | Admitting: Hematology

## 2023-01-05 DIAGNOSIS — Z5112 Encounter for antineoplastic immunotherapy: Secondary | ICD-10-CM | POA: Diagnosis not present

## 2023-01-05 DIAGNOSIS — C50411 Malignant neoplasm of upper-outer quadrant of right female breast: Secondary | ICD-10-CM | POA: Insufficient documentation

## 2023-01-05 DIAGNOSIS — C9000 Multiple myeloma not having achieved remission: Secondary | ICD-10-CM | POA: Diagnosis not present

## 2023-01-05 DIAGNOSIS — Z79624 Long term (current) use of inhibitors of nucleotide synthesis: Secondary | ICD-10-CM | POA: Diagnosis not present

## 2023-01-05 DIAGNOSIS — Z9011 Acquired absence of right breast and nipple: Secondary | ICD-10-CM | POA: Diagnosis not present

## 2023-01-05 DIAGNOSIS — Z79899 Other long term (current) drug therapy: Secondary | ICD-10-CM | POA: Diagnosis not present

## 2023-01-05 DIAGNOSIS — E1122 Type 2 diabetes mellitus with diabetic chronic kidney disease: Secondary | ICD-10-CM | POA: Insufficient documentation

## 2023-01-05 DIAGNOSIS — N186 End stage renal disease: Secondary | ICD-10-CM | POA: Insufficient documentation

## 2023-01-05 DIAGNOSIS — I1311 Hypertensive heart and chronic kidney disease without heart failure, with stage 5 chronic kidney disease, or end stage renal disease: Secondary | ICD-10-CM | POA: Diagnosis not present

## 2023-01-05 DIAGNOSIS — Z170 Estrogen receptor positive status [ER+]: Secondary | ICD-10-CM | POA: Diagnosis not present

## 2023-01-05 DIAGNOSIS — Z79811 Long term (current) use of aromatase inhibitors: Secondary | ICD-10-CM | POA: Insufficient documentation

## 2023-01-05 DIAGNOSIS — Z992 Dependence on renal dialysis: Secondary | ICD-10-CM | POA: Diagnosis not present

## 2023-01-05 LAB — CBC WITH DIFFERENTIAL/PLATELET
Abs Immature Granulocytes: 0.01 10*3/uL (ref 0.00–0.07)
Basophils Absolute: 0 10*3/uL (ref 0.0–0.1)
Basophils Relative: 1 %
Eosinophils Absolute: 0.2 10*3/uL (ref 0.0–0.5)
Eosinophils Relative: 5 %
HCT: 32.4 % — ABNORMAL LOW (ref 36.0–46.0)
Hemoglobin: 10 g/dL — ABNORMAL LOW (ref 12.0–15.0)
Immature Granulocytes: 0 %
Lymphocytes Relative: 16 %
Lymphs Abs: 0.7 10*3/uL (ref 0.7–4.0)
MCH: 31.6 pg (ref 26.0–34.0)
MCHC: 30.9 g/dL (ref 30.0–36.0)
MCV: 102.5 fL — ABNORMAL HIGH (ref 80.0–100.0)
Monocytes Absolute: 0.5 10*3/uL (ref 0.1–1.0)
Monocytes Relative: 11 %
Neutro Abs: 2.9 10*3/uL (ref 1.7–7.7)
Neutrophils Relative %: 67 %
Platelets: 219 10*3/uL (ref 150–400)
RBC: 3.16 MIL/uL — ABNORMAL LOW (ref 3.87–5.11)
RDW: 14.8 % (ref 11.5–15.5)
WBC: 4.3 10*3/uL (ref 4.0–10.5)
nRBC: 0 % (ref 0.0–0.2)

## 2023-01-05 LAB — COMPREHENSIVE METABOLIC PANEL
ALT: 16 U/L (ref 0–44)
AST: 22 U/L (ref 15–41)
Albumin: 3.6 g/dL (ref 3.5–5.0)
Alkaline Phosphatase: 73 U/L (ref 38–126)
Anion gap: 14 (ref 5–15)
BUN: 30 mg/dL — ABNORMAL HIGH (ref 8–23)
CO2: 26 mmol/L (ref 22–32)
Calcium: 9.6 mg/dL (ref 8.9–10.3)
Chloride: 93 mmol/L — ABNORMAL LOW (ref 98–111)
Creatinine, Ser: 6.77 mg/dL — ABNORMAL HIGH (ref 0.44–1.00)
GFR, Estimated: 6 mL/min — ABNORMAL LOW (ref >60)
Glucose, Bld: 97 mg/dL (ref 70–99)
Potassium: 4.7 mmol/L (ref 3.5–5.1)
Sodium: 133 mmol/L — ABNORMAL LOW (ref 135–145)
Total Bilirubin: 0.4 mg/dL (ref 0.3–1.2)
Total Protein: 7.6 g/dL (ref 6.5–8.1)

## 2023-01-05 LAB — MAGNESIUM: Magnesium: 2.6 mg/dL — ABNORMAL HIGH (ref 1.7–2.4)

## 2023-01-05 MED ORDER — PROCHLORPERAZINE MALEATE 10 MG PO TABS
10.0000 mg | ORAL_TABLET | Freq: Once | ORAL | Status: AC
  Administered 2023-01-05: 10 mg via ORAL
  Filled 2023-01-05: qty 1

## 2023-01-05 MED ORDER — BORTEZOMIB CHEMO SQ INJECTION 3.5 MG (2.5MG/ML)
1.2000 mg/m2 | Freq: Once | INTRAMUSCULAR | Status: AC
  Administered 2023-01-05: 2 mg via SUBCUTANEOUS
  Filled 2023-01-05: qty 0.8

## 2023-01-05 NOTE — Patient Instructions (Signed)

## 2023-01-05 NOTE — Progress Notes (Signed)
[facility] [address], [place] [address]    Clinic Day:  01/05/23   Referring physician: Sharee Holster, NP  Patient Care Team: Sharee Holster, NP as PCP - General (Geriatric Medicine) [name], Gerrit Friends, MD as Consulting Physician (Gastroenterology) [name], MD as Medical Oncologist (Medical Oncology) Salomon Mast, MD (Inactive) as Consulting Physician (Nephrology) [facility])   ASSESSMENT & PLAN:   Assessment: 1.  IgA lambda plasma cell myeloma, stage II, standard risk: -BMBX on 09/20/2018 shows plasma cell myeloma, 30% plasma cells.  FISH panel was normal.  Chromosome analysis normal. -Labs at diagnosis on 08/29/2018 with M spike 0.9 g.  Kappa light chains 148, lambda light chains 1132, ratio 0.13.  LDH normal.  Beta-2 microglobulin 13.5.  She had transfusion dependent anemia. -Velcade and dexamethasone started on 10/11/2018. -Myeloma panel on 07/11/2019 shows SPEP is negative.  Free light chain ratio improved to 1.09.  Lambda light chains are 128, improved from 141.  Kappa light chains are 140. -She has been resident at [facility] since 10/12/2019.   2.  Stage I IDC of the right breast, ER/PR positive, HER-2 negative: -She is on anastrozole.   Plan: 1.  IgA lambda plasma cell myeloma: - She was receiving Velcade every other week along with dexamethasone 10 mg on days of Velcade. - She was recently hospitalized with infection of dialysis catheter and missed her treatments. - She reports tingling in the fingertips and toes at nighttime.  She does not report any burning pains. - Reviewed labs today: Normal LFTs.  CBC grossly normal.  We have sent multiple myeloma panel which is pending. - Recommend restarting her back on Velcade every 2 weeks.  RTC 8 weeks for follow-up.   2.  ESRD on HD: - Continue HD on Monday, Wednesday and Friday.   3.  Bone strengthening: - Bisphosphonates not started as she  was on dialysis.   4.  Right breast cancer: - Continue anastrozole daily.  She is tolerating it well.  No orders of the defined types were placed in this encounter.      [name],acting as a Neurosurgeon for [name], MD.,have documented all relevant documentation on the behalf of [name], MD,as directed by  [name], MD while in the presence of [name], MD.  I, [name] MD, have reviewed the above documentation for accuracy and completeness, and I agree with the above.     [name], MD   10/29/20245:19 PM  CHIEF COMPLAINT:   Diagnosis: plasma cell myeloma and right breast cancer    Cancer Staging  Stage 1 infiltrating ductal carcinoma of right female breast [facility]) Staging form: Breast, AJCC 7th Edition - Clinical stage from 08/30/2015: Stage IA (T1c, N0, M0) - Signed by Ellouise Newer, PA-C on 08/30/2015    Prior Therapy: none  Current Therapy:  maintenance Velcade & Decadron 3/4 weeks; anastrozole   HISTORY OF PRESENT ILLNESS:   Oncology History  Stage 1 infiltrating ductal carcinoma of right female breast (HCC)  09/12/2014 Mammogram   Mass in upper outer R breast, middle third depth appears slightly larger than it was on prior exam with more irreg spiculated margins   10/02/2014 Pathology Results   biopsy with invasive ductal carcinoma high grade 1.1 cm, dcis solid type. additional R breast tissue, excision, invasive ductal high grade 0.9 cm    10/24/2014 Pathology Results   no residual invasive carcinoma, DCIS, focal, atypical  ductal hyperplasia, 0/7 LN positive for metastatic carcinoma ER > 90%, PR 30%, HER 2 2+   10/24/2014 Cancer Staging   T1cN0M0   10/24/2014 Surgery   R mastectomy, T1c, N0   12/28/2014 - 11/22/2015 Chemotherapy   Taxol/herceptin weekly X 12, Herceptin every 21 days, last due in September 2017   03/11/2015 -  Anti-estrogen oral therapy   Arimidex 1 mg daily   09/12/2015  Imaging   MUGA- The left ventricular ejection fraction equals 68%.   06/08/2016 Treatment Plan Change   Started Nerlynx   Multiple myeloma (HCC)  10/04/2018 Initial Diagnosis   Multiple myeloma (HCC)   10/11/2018 - 10/28/2021 Chemotherapy   Patient is on Treatment Plan : MYELOMA NON-TRANSPLANT CANDIDATES VRd weekly d21d      11/25/2021 -  Chemotherapy   Patient is on Treatment Plan : MYELOMA MAINTENANCE Bortezomib SQ q14d        INTERVAL HISTORY:   Carly Wood is a 87 y.o. female presenting to clinic today for follow up of plasma cell myeloma and breast cancer. She was last seen by me on 09/29/22.  Since her last visit, she was admitted to the hospital on 12/16/22 for sepsis with gram-positive cocci bacteremia. She had a Staphylococcus hominis catheter infection and catheter was removed on 10/11 with a new catheter placed on 10/14. She was started on 2 weeks of vancomycin IV with hemodialysis through 10/23.   Today, she states that she is doing well overall. Her appetite level is at 100%. Her energy level is at 50%.   She reports she is back to baseline today after hospitalization. She notes numbness at her fingertips on her bilateral fingers and toes. Numbness in the fingers occur at night only. There is also a burning sensation on accompanied with the numbness on the toes.   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   Dysphagia    Dyspnea    with activity   GERD (gastroesophageal reflux disease)    Glaucoma    Hyperlipidemia    Hypertension     Multiple myeloma (HCC)    Pneumonia    Stage 1 infiltrating ductal carcinoma of right female breast (HCC) 08/21/2015   ER+ PR+ HER 2 neu + (3+) T1cN0     Surgical History: Past Surgical History:  Procedure Laterality Date   A/V FISTULAGRAM Right 07/05/2020   Procedure: A/V Fistulagram;  Surgeon: Sherren Kerns, MD;  [facility];  Service: Cardiovascular;  Laterality: Right;   AV FISTULA PLACEMENT Left 11/22/2017   Procedure: ARTERIOVENOUS (AV) FISTULA CREATION LEFT ARM;  Surgeon: Sherren Kerns, MD;  Location: [facility] OR;  Service: Vascular;  Laterality: Left;   AV FISTULA PLACEMENT Right 04/04/2020   Procedure: RIGHT ARM ARTERIOVENOUS FISTULA CREATION;  Surgeon: Larina Earthly, MD;  Location: [facility] ORS;  Service: Vascular;  Laterality: Right;   AV FISTULA PLACEMENT Right 08/20/2020   Procedure: ARTERIOVENOUS (AV) FISTULA LIGATION RIGHT ARM;  Surgeon: Sherren Kerns, MD;  Location: [facility]  OR;  Service: Vascular;  Laterality: Right;   BIOPSY  08/07/2016   Procedure: BIOPSY;  Surgeon: Corbin Ade, MD;  Location: [facility];  Service: Endoscopy;;  gastric ulcer biopsy   COLONOSCOPY     ESOPHAGOGASTRODUODENOSCOPY N/A 08/07/2016   LA Grade A esophagitis s/p dilation, small hiatal hernia, multiple gastric ulcers and erosions, duodenal erosions s/p biopsy. Negative H.pylori    ESOPHAGOGASTRODUODENOSCOPY N/A 11/27/2016   normal esophagus, previously noted gastric ulcers completely healed, normal duodenum.    ESOPHAGOGASTRODUODENOSCOPY (EGD) WITH PROPOFOL N/A 07/23/2020   Procedure: ESOPHAGOGASTRODUODENOSCOPY (EGD) WITH PROPOFOL;  Surgeon: Malissa Hippo, MD;  [facility];  Service: Endoscopy;  Laterality: N/A;   FISTULA SUPERFICIALIZATION Left 02/14/2018   Procedure: FISTULA SUPERFICIALIZATION LEFT ARM;  Surgeon: Chuck Hint, MD;  Location: [facility] OR;  Service: Vascular;  Laterality: Left;   FRACTURE SURGERY Right    ankle   HOT HEMOSTASIS  07/23/2020    Procedure: HOT HEMOSTASIS (ARGON PLASMA COAGULATION/BICAP);  Surgeon: Malissa Hippo, MD;  [facility];  Service: Endoscopy;;   INTRAMEDULLARY (IM) NAIL INTERTROCHANTERIC Right 07/12/2020   Procedure: INTRAMEDULLARY (IM) NAIL INTERTROCHANTRIC;  Surgeon: Oliver Barre, MD;  Location: [facility];  Service: Orthopedics;  Laterality: Right;   IR FLUORO GUIDE CV LINE LEFT  12/21/2022   IR US GUIDE VASC ACCESS LEFT  12/21/2022   MALONEY DILATION N/A 08/07/2016   Procedure: Elease Hashimoto DILATION;  Surgeon: Corbin Ade, MD;  [facility];  Service: Endoscopy;  Laterality: N/A;   MASTECTOMY, PARTIAL Right    PERIPHERAL VASCULAR BALLOON ANGIOPLASTY Left 07/13/2019   Procedure: PERIPHERAL VASCULAR BALLOON ANGIOPLASTY;  Surgeon: Cephus Shelling, MD;  [facility]: [facility];  Service: Cardiovascular;  Laterality: Left;  arm fistulogram   PERIPHERAL VASCULAR BALLOON ANGIOPLASTY Right 05/22/2020   Procedure: PERIPHERAL VASCULAR BALLOON ANGIOPLASTY;  Surgeon: Leonie Douglas, MD;  [facility]: [facility];  Service: Cardiovascular;  Laterality: Right;  arm fistula   PERIPHERAL VASCULAR BALLOON ANGIOPLASTY Right 07/05/2020   Procedure: PERIPHERAL VASCULAR BALLOON ANGIOPLASTY;  Surgeon: Sherren Kerns, MD;  Location: [facility];  Service: Cardiovascular;  Laterality: Right;  arm fistula   PORT-A-CATH REMOVAL Left 11/22/2017   Procedure: REMOVAL PORT-A-CATH LEFT CHEST;  Surgeon: Sherren Kerns, MD;  Location: [facility] OR;  Service: Vascular;  Laterality: Left;   RETINAL DETACHMENT SURGERY Right    SCLEROTHERAPY  07/23/2020   Procedure: SCLEROTHERAPY;  Surgeon: Malissa Hippo, MD;  [facility];  Service: Endoscopy;;   UPPER EXTREMITY VENOGRAPHY N/A 08/22/2020   Procedure: LEFT UPPER & CENTRAL VENOGRAPHY;  Surgeon: Cephus Shelling, MD;  [facility];  Service: Cardiovascular;  Laterality: N/A;    Social History: Social History    Socioeconomic History   Marital status: Single    Spouse name: Not on file   Number of children: Not on file   Years of education: Not on file   Highest education level: Not on file  Occupational History   Occupation: retired   Tobacco Use   Smoking status: Never   Smokeless tobacco: Never  Vaping Use   Vaping status: Never Used  Substance and Sexual Activity   Alcohol use: No    Alcohol/week: 0.0 standard drinks of alcohol   Drug use: No   Sexual activity: Never  Other Topics Concern   Not on file  Social History Narrative   Long term resident of SNF  Social Determinants of Health   Financial Resource Strain: Low Risk  (01/09/2020)   Overall Financial Resource Strain (CARDIA)    Difficulty of Paying Living Expenses: Not very hard  Food Insecurity: No Food Insecurity (12/16/2022)   Hunger Vital Sign    Worried About Running Out of Food in the Last Year: Never true    Ran Out of Food in the Last Year: Never true  Transportation Needs: No Transportation Needs (12/16/2022)   PRAPARE - Administrator, Civil Service (Medical): No    Lack of Transportation (Non-Medical): No  Physical Activity: Inactive (01/09/2020)   Exercise Vital Sign    Days of Exercise per Week: 0 days    Minutes of Exercise per Session: 0 min  Stress: No Stress Concern Present (01/09/2020)   [employer] of Occupational Health - Occupational Stress Questionnaire    Feeling of Stress : Not at all  Social Connections: Moderately Isolated (01/09/2020)   Social Connection and Isolation Panel [NHANES]    Frequency of Communication with Friends and Family: More than three times a week    Frequency of Social Gatherings with Friends and Family: Once a week    Attends Religious Services: More than 4 times per year    Active Member of [employer] or Organizations: No    Attends Banker Meetings: Never    Marital Status: Never married  Intimate Partner Violence: Not At Risk (12/16/2022)    Humiliation, Afraid, Rape, and Kick questionnaire    Fear of Current or Ex-Partner: No    Emotionally Abused: No    Physically Abused: No    Sexually Abused: No    Family History: Family History  Problem Relation Age of Onset   Multiple myeloma Sister    Brain cancer Sister    Dementia Mother        died at 96   Stroke Mother    Heart failure Mother    Diabetes Mother    Heart disease Father    Prostate cancer Brother    Colon cancer Neg Hx     Current Medications:  Current Outpatient Medications:    acetaminophen (TYLENOL) 325 MG tablet, Take 650 mg by mouth 3 (three) times daily., Disp: , Rfl:    acyclovir (ZOVIRAX) 200 MG capsule, Take 200 mg by mouth in the morning. (0800), Disp: , Rfl:    anastrozole (ARIMIDEX) 1 MG tablet, TAKE 1 TABLET BY MOUTH DAILY, Disp: 30 tablet, Rfl: 6   Calcium Carb-Cholecalciferol (CALCIUM + VITAMIN D3) 500-5 MG-MCG TABS, Take 1 tablet by mouth daily., Disp: , Rfl:    melatonin 5 MG TABS, Take 5 mg by mouth at bedtime., Disp: , Rfl:    multivitamin (RENA-VIT) TABS tablet, Take 1 tablet by mouth at bedtime., Disp: , Rfl:    Nutritional Supplements (ENSURE ENLIVE PO), Take 237 mLs by mouth every evening., Disp: , Rfl:    ondansetron (ZOFRAN-ODT) 4 MG disintegrating tablet, Take 4 mg by mouth daily before breakfast., Disp: , Rfl:    pantoprazole (PROTONIX) 40 MG tablet, Take 40 mg by mouth 2 (two) times daily., Disp: , Rfl:    sennosides-docusate sodium (SENOKOT-S) 8.6-50 MG tablet, Take 1 tablet by mouth 2 (two) times daily., Disp: , Rfl:    sertraline (ZOLOFT) 25 MG tablet, Take 25 mg by mouth daily., Disp: , Rfl:    sucralfate (CARAFATE) 1 GM/10ML suspension, Take 1 g by mouth in the morning, at noon, in the evening, and at bedtime., Disp: ,  Rfl:  No current facility-administered medications for this visit.  Facility-Administered Medications Ordered in Other Visits:    lanreotide acetate (SOMATULINE DEPOT) 120 MG/0.5ML injection, , , ,     lanreotide acetate (SOMATULINE DEPOT) 120 MG/0.5ML injection, , , ,    lanreotide acetate (SOMATULINE DEPOT) 120 MG/0.5ML injection, , , ,    lanreotide acetate (SOMATULINE DEPOT) 120 MG/0.5ML injection, , , ,    octreotide (SANDOSTATIN LAR) 30 MG IM injection, , , ,    octreotide (SANDOSTATIN LAR) 30 MG IM injection, , , ,    Allergies: Allergies  Allergen Reactions   Ace Inhibitors Other (See Comments) and Cough    Tongue swelling Angioedema    Angiotensin Receptor Blockers Other (See Comments)    Angioedema with ACE-I   Penicillins Other (See Comments)    Unknown reaction    REVIEW OF SYSTEMS:   Review of Systems  Constitutional:  Negative for chills, fatigue and fever.  HENT:   Negative for lump/mass, mouth sores, nosebleeds, sore throat and trouble swallowing.   Eyes:  Negative for eye problems.  Respiratory:  Positive for cough. Negative for shortness of breath.   Cardiovascular:  Negative for chest pain, leg swelling and palpitations.  Gastrointestinal:  Negative for abdominal pain, constipation, diarrhea, nausea and vomiting.  Genitourinary:  Negative for bladder incontinence, difficulty urinating, dysuria, frequency, hematuria and nocturia.   Musculoskeletal:  Negative for arthralgias, back pain, flank pain, myalgias and neck pain.  Skin:  Negative for itching and rash.  Neurological:  Positive for numbness (in fingers). Negative for dizziness and headaches.  Hematological:  Does not bruise/bleed easily.  Psychiatric/Behavioral:  Negative for depression, sleep disturbance and suicidal ideas. The patient is not nervous/anxious.   All other systems reviewed and are negative.    VITALS:   Blood pressure (!) 140/71, pulse 86, temperature 98.4 F (36.9 C), temperature source Oral, resp. rate 16, SpO2 100%.  Wt Readings from Last 3 Encounters:  12/31/22 142 lb 12.8 oz (64.8 kg)  12/23/22 139 lb (63 kg)  12/22/22 136 lb 7.4 oz (61.9 kg)    There is no height or weight  on file to calculate BMI.  Performance status (ECOG): 1 - Symptomatic but completely ambulatory  PHYSICAL EXAM:   Physical Exam Vitals and nursing note reviewed. Exam conducted with a chaperone present.  Constitutional:      Appearance: Normal appearance.  Cardiovascular:     Rate and Rhythm: Normal rate and regular rhythm.     Pulses: Normal pulses.     Heart sounds: Normal heart sounds.  Pulmonary:     Effort: Pulmonary effort is normal.     Breath sounds: Normal breath sounds.  Abdominal:     Palpations: Abdomen is soft. There is no hepatomegaly, splenomegaly or mass.     Tenderness: There is no abdominal tenderness.  Musculoskeletal:     Right lower leg: No edema.     Left lower leg: No edema.  Lymphadenopathy:     Cervical: No cervical adenopathy.     Right cervical: No superficial, deep or posterior cervical adenopathy.    Left cervical: No superficial, deep or posterior cervical adenopathy.     Upper Body:     Right upper body: No supraclavicular or axillary adenopathy.     Left upper body: No supraclavicular or axillary adenopathy.  Neurological:     General: No focal deficit present.     Mental Status: She is alert and oriented to person, place, and time.  Psychiatric:        Mood and Affect: Mood normal.        Behavior: Behavior normal.     LABS:      Latest Ref Rng & Units 01/05/2023   11:57 AM 12/22/2022    4:05 AM 12/21/2022    3:39 AM  CBC  WBC 4.0 - 10.5 K/uL 4.3  4.2  4.5   Hemoglobin 12.0 - 15.0 g/dL 95.6  9.8  21.3   Hematocrit 36.0 - 46.0 % 32.4  30.1  32.5   Platelets 150 - 400 K/uL 219  184  190       Latest Ref Rng & Units 01/05/2023   11:57 AM 12/22/2022    4:05 AM 12/21/2022    3:39 AM  CMP  Glucose 70 - 99 mg/dL 97  90  74   BUN 8 - 23 mg/dL 30  81  66   Creatinine 0.44 - 1.00 mg/dL 0.86  57.84  6.96   Sodium 135 - 145 mmol/L 133  136  137   Potassium 3.5 - 5.1 mmol/L 4.7  5.0  4.6   Chloride 98 - 111 mmol/L 93  96  97   CO2 22  - 32 mmol/L 26  20  21    Calcium 8.9 - 10.3 mg/dL 9.6  8.8  9.0   Total Protein 6.5 - 8.1 g/dL 7.6     Total Bilirubin 0.3 - 1.2 mg/dL 0.4     Alkaline Phos 38 - 126 U/L 73     AST 15 - 41 U/L 22     ALT 0 - 44 U/L 16        No results found for: "CEA1", "CEA" / No results found for: "CEA1", "CEA" No results found for: "PSA1" No results found for: "CAN199" No results found for: "CAN125"  Lab Results  Component Value Date   TOTALPROTELP 6.6 09/15/2022   TOTALPROTELP 6.6 09/15/2022   ALBUMINELP 3.8 09/15/2022   A1GS 0.3 09/15/2022   A2GS 0.7 09/15/2022   BETS 1.0 09/15/2022   GAMS 0.7 09/15/2022   MSPIKE 0.3 (H) 09/15/2022   SPEI Comment 09/15/2022   Lab Results  Component Value Date   TIBC 110 (L) 12/18/2022   TIBC 140 (L) 12/16/2021   TIBC 142 (L) 08/12/2021   FERRITIN 277 12/18/2022   FERRITIN 272 12/16/2021   FERRITIN 312 (H) 08/12/2021   IRONPCTSAT 36 (H) 12/18/2022   IRONPCTSAT 39 (H) 12/16/2021   IRONPCTSAT 65 (H) 08/12/2021   Lab Results  Component Value Date   LDH 120 10/21/2021   LDH 129 10/14/2021   LDH 136 10/07/2020     STUDIES:   IR Fluoro Guide CV Line Left  Result Date: 12/21/2022 INDICATION: ESRD requiring HD. Recent HD catheter removal for bacteremia. Completely line holiday. EXAM: TUNNELED CENTRAL VENOUS HEMODIALYSIS CATHETER PLACEMENT WITH ULTRASOUND AND FLUOROSCOPIC GUIDANCE MEDICATIONS: Ancef 2 gm IV . The antibiotic was given in an appropriate time interval prior to skin puncture. ANESTHESIA/SEDATION: Moderate (conscious) sedation was employed during this procedure. A total of Versed 1 mg and Fentanyl 50 mcg was administered intravenously. Moderate Sedation Time: 21 minutes. The patient's level of consciousness and vital signs were monitored continuously by radiology nursing throughout the procedure under my direct supervision. FLUOROSCOPY TIME:  Fluoroscopic dose; 5 mGy COMPLICATIONS: None immediate. PROCEDURE: Informed written consent was  obtained from the patient and/or patient's representative after a discussion of the risks, benefits, and alternatives to treatment. Questions regarding the procedure were  encouraged and answered. The LEFT neck and chest were prepped with chlorhexidine in a sterile fashion, and a sterile drape was applied covering the operative field. Maximum barrier sterile technique with sterile gowns and gloves were used for the procedure. A timeout was performed prior to the initiation of the procedure. After creating a small venotomy incision, a micropuncture kit was utilized to access the internal jugular vein. Real-time ultrasound guidance was utilized for vascular access including the acquisition of a permanent ultrasound image documenting patency of the accessed vessel. The microwire was utilized to measure appropriate catheter length. A stiff Glidewire was advanced to the level of the IVC and the micropuncture sheath was exchanged for a peel-away sheath. A palindrome tunneled hemodialysis catheter measuring 23 cm from tip to cuff was tunneled in a retrograde fashion from the anterior chest wall to the venotomy incision. The catheter was then placed through the peel-away sheath with tips ultimately positioned within the superior aspect of the right atrium. Final catheter positioning was confirmed and documented with a spot radiographic image. The catheter aspirates and flushes normally. The catheter was flushed with appropriate volume heparin dwells. The catheter exit site was secured with a 2-0 Ethilon retention suture. The venotomy incision was closed with Dermabond. Dressings were applied. The patient tolerated the procedure well without immediate post procedural complication. IMPRESSION: Successful placement of 23 cm tip to cuff tunneled hemodialysis catheter via the LEFT internal jugular vein The tip of the catheter is positioned at the superior cavo-atrial junction. The catheter is ready for immediate use. Roanna Banning,  MD [facility] Radiology Electronically Signed   By: Roanna Banning M.D.   On: 12/21/2022 15:51   IR US Guide Vasc Access Left  Result Date: 12/21/2022 INDICATION: ESRD requiring HD. Recent HD catheter removal for bacteremia. Completely line holiday. EXAM: TUNNELED CENTRAL VENOUS HEMODIALYSIS CATHETER PLACEMENT WITH ULTRASOUND AND FLUOROSCOPIC GUIDANCE MEDICATIONS: Ancef 2 gm IV . The antibiotic was given in an appropriate time interval prior to skin puncture. ANESTHESIA/SEDATION: Moderate (conscious) sedation was employed during this procedure. A total of Versed 1 mg and Fentanyl 50 mcg was administered intravenously. Moderate Sedation Time: 21 minutes. The patient's level of consciousness and vital signs were monitored continuously by radiology nursing throughout the procedure under my direct supervision. FLUOROSCOPY TIME:  Fluoroscopic dose; 5 mGy COMPLICATIONS: None immediate. PROCEDURE: Informed written consent was obtained from the patient and/or patient's representative after a discussion of the risks, benefits, and alternatives to treatment. Questions regarding the procedure were encouraged and answered. The LEFT neck and chest were prepped with chlorhexidine in a sterile fashion, and a sterile drape was applied covering the operative field. Maximum barrier sterile technique with sterile gowns and gloves were used for the procedure. A timeout was performed prior to the initiation of the procedure. After creating a small venotomy incision, a micropuncture kit was utilized to access the internal jugular vein. Real-time ultrasound guidance was utilized for vascular access including the acquisition of a permanent ultrasound image documenting patency of the accessed vessel. The microwire was utilized to measure appropriate catheter length. A stiff Glidewire was advanced to the level of the IVC and the micropuncture sheath was exchanged for a peel-away sheath. A  palindrome tunneled hemodialysis catheter measuring 23 cm from tip to cuff was tunneled in a retrograde fashion from the anterior chest wall to the venotomy incision. The catheter was then placed through the peel-away sheath with tips ultimately positioned within the superior aspect of the right  atrium. Final catheter positioning was confirmed and documented with a spot radiographic image. The catheter aspirates and flushes normally. The catheter was flushed with appropriate volume heparin dwells. The catheter exit site was secured with a 2-0 Ethilon retention suture. The venotomy incision was closed with Dermabond. Dressings were applied. The patient tolerated the procedure well without immediate post procedural complication. IMPRESSION: Successful placement of 23 cm tip to cuff tunneled hemodialysis catheter via the LEFT internal jugular vein The tip of the catheter is positioned at the superior cavo-atrial junction. The catheter is ready for immediate use. Roanna Banning, MD [facility] Radiology Electronically Signed   By: Roanna Banning M.D.   On: 12/21/2022 15:51   ECHOCARDIOGRAM COMPLETE  Result Date: 12/19/2022    ECHOCARDIOGRAM REPORT   Patient Name:   Carly Wood Date of Exam: 12/18/2022 Medical Rec #:  [id number]      Height:       63.0 in Accession #:    [id number]     Weight:       138.9 lb Date of Birth:  [birth year]     BSA:          1.656 m Patient Age:    86 years       BP:           142/70 mmHg Patient Gender: F              HR:           107 bpm. Exam Location:  Inpatient Procedure: 2D Echo, Cardiac Doppler and Color Doppler Indications:    Bacteremia R78.81  History:        Patient has prior history of Echocardiogram examinations, most                 recent 09/28/2021. Signs/Symptoms:Syncope; Risk                 Factors:Hypertension, Diabetes and Dyslipidemia. ESRD, Breast                 cancer (Right) breast.  Sonographer:    Lucendia Herrlich  Referring Phys: [id number] KRISHNAN IMPRESSIONS  1. Intracavitary gradient of 98 mmHg (vel 4.9 m/s) due to hyperdynamic function.. Left ventricular ejection fraction, by estimation, is >75%. The left ventricle has hyperdynamic function. The left ventricle has no regional wall motion abnormalities. There is moderate asymmetric left ventricular hypertrophy of the septal segment. Left ventricular diastolic parameters are indeterminate.  2. Right ventricular systolic function is normal. The right ventricular size is normal. Tricuspid regurgitation signal is inadequate for assessing PA pressure.  3. Chordal SAM. The mitral valve is degenerative. Trivial mitral valve regurgitation. No evidence of mitral stenosis. The mean mitral valve gradient is 4.0 mmHg with average heart rate of 105 bpm.  4. The aortic valve is abnormal. There is mild calcification of the aortic valve. Aortic valve regurgitation is not visualized. Mild to moderate aortic valve stenosis. Aortic valve area, by VTI measures 1.37 cm. Aortic valve mean gradient measures 17.0  mmHg. Aortic valve Vmax measures 2.84 m/s.  5. The inferior vena cava is normal in size with greater than 50% respiratory variability, suggesting right atrial pressure of 3 mmHg. FINDINGS  Left Ventricle: Intracavitary gradient of 98 mmHg (vel 4.9 m/s) due to hyperdynamic function. Left ventricular ejection fraction, by estimation, is >75%. The left ventricle has hyperdynamic function. The left ventricle has no regional wall motion abnormalities. The left ventricular internal cavity size was  small. There is moderate asymmetric left ventricular hypertrophy of the septal segment. Left ventricular diastolic parameters are indeterminate. Right Ventricle: The right ventricular size is normal. No increase in right ventricular wall thickness. Right ventricular systolic function is normal. Tricuspid regurgitation signal is inadequate for assessing PA pressure. The tricuspid regurgitant  velocity is 1.71 m/s, and with an assumed right atrial pressure of 3 mmHg, the estimated right ventricular systolic pressure is 14.7 mmHg. Left Atrium: Left atrial size was normal in size. Right Atrium: Right atrial size was normal in size. Pericardium: Trivial pericardial effusion is present. Mitral Valve: Chordal SAM. The mitral valve is degenerative in appearance. Trivial mitral valve regurgitation. No evidence of mitral valve stenosis. MV peak gradient, 12.0 mmHg. The mean mitral valve gradient is 4.0 mmHg with average heart rate of 105 bpm. Tricuspid Valve: The tricuspid valve is normal in structure. Tricuspid valve regurgitation is trivial. No evidence of tricuspid stenosis. Aortic Valve: The aortic valve is abnormal. There is mild calcification of the aortic valve. Aortic valve regurgitation is not visualized. Mild to moderate aortic stenosis is present. Aortic valve mean gradient measures 17.0 mmHg. Aortic valve peak gradient measures 32.3 mmHg. Aortic valve area, by VTI measures 1.37 cm. Pulmonic Valve: The pulmonic valve was normal in structure. Pulmonic valve regurgitation is not visualized. No evidence of pulmonic stenosis. Aorta: The aortic root is normal in size and structure. Venous: The inferior vena cava is normal in size with greater than 50% respiratory variability, suggesting right atrial pressure of 3 mmHg. IAS/Shunts: No atrial level shunt detected by color flow Doppler.  LEFT VENTRICLE PLAX 2D LVIDd:         3.10 cm   Diastology LVIDs:         2.00 cm   LV e' medial:    4.90 cm/s LV PW:         1.00 cm   LV E/e' medial:  11.5 LV IVS:        1.40 cm   LV e' lateral:   6.85 cm/s LVOT diam:     2.00 cm   LV E/e' lateral: 8.2 LV SV:         53 LV SV Index:   32 LVOT Area:     3.14 cm  RIGHT VENTRICLE             IVC RV S prime:     19.40 cm/s  IVC diam: 1.80 cm TAPSE (M-mode): 1.6 cm LEFT ATRIUM             Index        RIGHT ATRIUM          Index LA diam:        3.40 cm 2.05 cm/m   RA Area:      6.53 cm LA Vol (A2C):   35.6 ml 21.50 ml/m  RA Volume:   9.40 ml  5.68 ml/m LA Vol (A4C):   41.6 ml 25.12 ml/m LA Biplane Vol: 42.5 ml 25.66 ml/m  AORTIC VALVE AV Area (Vmax):    1.34 cm AV Area (Vmean):   1.33 cm AV Area (VTI):     1.37 cm AV Vmax:           284.00 cm/s AV Vmean:          178.200 cm/s AV VTI:            0.387 m AV Peak Grad:      32.3 mmHg AV Mean Grad:  17.0 mmHg LVOT Vmax:         121.00 cm/s LVOT Vmean:        75.300 cm/s LVOT VTI:          0.168 m LVOT/AV VTI ratio: 0.44  AORTA Ao Root diam: 3.00 cm Ao Asc diam:  2.90 cm MITRAL VALVE                TRICUSPID VALVE MV Area (PHT): 2.95 cm     TR Peak grad:   11.7 mmHg MV Area VTI:   1.84 cm     TR Vmax:        171.00 cm/s MV Peak grad:  12.0 mmHg MV Mean grad:  4.0 mmHg     SHUNTS MV Vmax:       1.74 m/s     Systemic VTI:  0.17 m MV Vmean:      96.4 cm/s    Systemic Diam: 2.00 cm MV Decel Time: 257 msec MR Peak grad: 74.3 mmHg MR Vmax:      431.00 cm/s MV E velocity: 56.20 cm/s MV A velocity: 133.00 cm/s MV E/A ratio:  0.42 Weston Brass MD Electronically signed by Weston Brass MD Signature Date/Time: 12/19/2022/2:18:52 PM    Final    DG Chest Port 1 View  Result Date: 12/18/2022 CLINICAL DATA:  Removal of vascular catheter EXAM: PORTABLE CHEST 1 VIEW COMPARISON:  12/16/2022 FINDINGS: Interim removal of left-sided vascular catheter. No acute airspace disease or pleural effusion. Stable cardiomediastinal silhouette with aortic atherosclerosis. No pneumothorax. Coarse oval calcification projecting over left breast soft tissues. Skin fold artifact right chest. IMPRESSION: Interim removal of left-sided vascular catheter. Lung fields are clear aside from minimal atelectasis or scarring in the left lower lung Electronically Signed   By: Jasmine Pang M.D.   On: 12/18/2022 16:55   DG Chest Port 1 View  Result Date: 12/16/2022 CLINICAL DATA:  Cough, altered mental status. EXAM: PORTABLE CHEST 1 VIEW COMPARISON:  10/01/2021.  FINDINGS: The heart is enlarged and the mediastinal contour is within normal limits. Atherosclerotic calcification of the aorta is noted. Lung volumes are low. No consolidation, effusion, or pneumothorax. A left internal jugular central venous catheter appear stable. Surgical clips are present in the right axilla. No acute osseous abnormality. IMPRESSION: No active disease. Electronically Signed   By: Thornell Sartorius M.D.   On: 12/16/2022 20:29

## 2023-01-05 NOTE — Progress Notes (Signed)
Patient has been examined by Dr. Katragadda. Vital signs and labs have been reviewed by MD - ANC, Creatinine, LFTs, hemoglobin, and platelets are within treatment parameters per M.D. - pt may proceed with treatment.  Primary RN and pharmacy notified.  

## 2023-01-05 NOTE — Patient Instructions (Signed)
[facility] [place]  Discharge Instructions: Thank you for choosing [facility] to provide your oncology and hematology care.  If you have a lab appointment with the Cancer Center - please note that after April 8th, 2024, all labs will be drawn in the cancer center.  You do not have to check in or register with the main entrance as you have in the past but will complete your check-in in the cancer center.  Wear comfortable clothing and clothing appropriate for easy access to any Portacath or PICC line.   We strive to give you quality time with your provider. You may need to reschedule your appointment if you arrive late (15 or more minutes).  Arriving late affects you and other patients whose appointments are after yours.  Also, if you miss three or more appointments without notifying the office, you may be dismissed from the clinic at the provider's discretion.      For prescription refill requests, have your pharmacy contact our office and allow 72 hours for refills to be completed.    Today you received the following chemotherapy and/or immunotherapy agents velcade    To help prevent nausea and vomiting after your treatment, we encourage you to take your nausea medication as directed.  BELOW ARE SYMPTOMS THAT SHOULD BE REPORTED IMMEDIATELY: *FEVER GREATER THAN 100.4 F (38 C) OR HIGHER *CHILLS OR SWEATING *NAUSEA AND VOMITING THAT IS NOT CONTROLLED WITH YOUR NAUSEA MEDICATION *UNUSUAL SHORTNESS OF BREATH *UNUSUAL BRUISING OR BLEEDING *URINARY PROBLEMS (pain or burning when urinating, or frequent urination) *BOWEL PROBLEMS (unusual diarrhea, constipation, pain near the anus) TENDERNESS IN MOUTH AND THROAT WITH OR WITHOUT PRESENCE OF ULCERS (sore throat, sores in mouth, or a toothache) UNUSUAL RASH, SWELLING OR PAIN  UNUSUAL VAGINAL DISCHARGE OR ITCHING   Items with * indicate a potential emergency and should be followed up as soon as possible or go to the  Emergency Department if any problems should occur.  Please show the CHEMOTHERAPY ALERT CARD or IMMUNOTHERAPY ALERT CARD at check-in to the Emergency Department and triage nurse.  Should you have questions after your visit or need to cancel or reschedule your appointment, please contact [facility] [place] [phone]  and follow the prompts.  Office hours are 8:00 a.m. to 4:30 p.m. Monday - Friday. Please note that voicemails left after 4:00 p.m. may not be returned until the following business day.  We are closed weekends and major holidays. You have access to a nurse at all times for urgent questions. Please call the main number to the clinic [phone] and follow the prompts.  For any non-urgent questions, you may also contact your provider using MyChart. We now offer e-Visits for anyone 18 and older to request care online for non-urgent symptoms. For details visit [email or web address].   Also download the MyChart app! Go to the app store, search "MyChart", open the app, select [place], and log in with your MyChart username and password.   

## 2023-01-05 NOTE — Progress Notes (Signed)
Patient tolerated injection with no complaints voiced.  Site clean and dry with no bruising or swelling noted at site.  See MAR for details.  Band aid applied.  Patient stable during and after injection.  Vss with discharge and left in satisfactory condition with no s/s of distress noted.  

## 2023-01-06 DIAGNOSIS — Z992 Dependence on renal dialysis: Secondary | ICD-10-CM | POA: Diagnosis not present

## 2023-01-06 DIAGNOSIS — T80212A Local infection due to central venous catheter, initial encounter: Secondary | ICD-10-CM | POA: Diagnosis not present

## 2023-01-06 DIAGNOSIS — N2581 Secondary hyperparathyroidism of renal origin: Secondary | ICD-10-CM | POA: Diagnosis not present

## 2023-01-06 DIAGNOSIS — N186 End stage renal disease: Secondary | ICD-10-CM | POA: Diagnosis not present

## 2023-01-06 DIAGNOSIS — B9689 Other specified bacterial agents as the cause of diseases classified elsewhere: Secondary | ICD-10-CM | POA: Diagnosis not present

## 2023-01-06 LAB — KAPPA/LAMBDA LIGHT CHAINS
Kappa free light chain: 187.7 mg/L — ABNORMAL HIGH (ref 3.3–19.4)
Kappa, lambda light chain ratio: 0.36 (ref 0.26–1.65)
Lambda free light chains: 523.8 mg/L — ABNORMAL HIGH (ref 5.7–26.3)

## 2023-01-08 ENCOUNTER — Encounter: Payer: Self-pay | Admitting: Adult Health

## 2023-01-08 ENCOUNTER — Non-Acute Institutional Stay (SKILLED_NURSING_FACILITY): Payer: Medicare PPO | Admitting: Adult Health

## 2023-01-08 DIAGNOSIS — Z992 Dependence on renal dialysis: Secondary | ICD-10-CM

## 2023-01-08 DIAGNOSIS — F01518 Vascular dementia, unspecified severity, with other behavioral disturbance: Secondary | ICD-10-CM

## 2023-01-08 DIAGNOSIS — N186 End stage renal disease: Secondary | ICD-10-CM

## 2023-01-08 DIAGNOSIS — E1122 Type 2 diabetes mellitus with diabetic chronic kidney disease: Secondary | ICD-10-CM

## 2023-01-08 DIAGNOSIS — I70209 Unspecified atherosclerosis of native arteries of extremities, unspecified extremity: Secondary | ICD-10-CM

## 2023-01-08 DIAGNOSIS — N2581 Secondary hyperparathyroidism of renal origin: Secondary | ICD-10-CM | POA: Diagnosis not present

## 2023-01-08 LAB — PROTEIN ELECTROPHORESIS, SERUM
A/G Ratio: 1.3 (ref 0.7–1.7)
Albumin ELP: 3.9 g/dL (ref 2.9–4.4)
Alpha-1-Globulin: 0.3 g/dL (ref 0.0–0.4)
Alpha-2-Globulin: 0.8 g/dL (ref 0.4–1.0)
Beta Globulin: 1.1 g/dL (ref 0.7–1.3)
Gamma Globulin: 0.7 g/dL (ref 0.4–1.8)
Globulin, Total: 2.9 g/dL (ref 2.2–3.9)
M-Spike, %: 0.3 g/dL — ABNORMAL HIGH
Total Protein ELP: 6.8 g/dL (ref 6.0–8.5)

## 2023-01-08 NOTE — Progress Notes (Unsigned)
Location:  [facility] Room Number: 149 Place of Service:  SNF (31)   CODE STATUS: DNR  Allergies  Allergen Reactions   Ace Inhibitors Other (See Comments) and Cough    Tongue swelling Angioedema    Angiotensin Receptor Blockers Other (See Comments)    Angioedema with ACE-I   Penicillins Other (See Comments)    Unknown reaction    Chief Complaint  Patient presents with   Acute Visit    Care Planning meeting    HPI:  We have come together for her care plan meeting. Family present. BIMS 11/15 mood 3/30: decreased energy. She is out of bed to wheelchair with out falls. She requires max to dependent assist with her adl care. She is frequently incontinent of bladder and bowel. Dietary: requires setup for meals; D3 diet 1200 cc fluid restriction; weight is 144 pounds. Therapy: none at this time. Activities; socializing, music, food activities. She continues to be followed for her chronic illnesses including: Atherosclerotic peripheral vascular disease   Chronic kidney disease with end stage renal disease on dialysis due to type 2 diabetes mellitus    Vascular dementia with behavioral disturbance   Past Medical History:  Diagnosis Date   Anemia     chronic macrocytic anemia   Anxiety    Chronic kidney disease    Chronic renal disease, stage 4, severely decreased glomerular filtration rate (GFR) between 15-29 mL/min/1.73 square meter (HCC) 08/22/2015   Complication of anesthesia    delirious after Breast Surgery   Dementia (HCC)    mild   Depression    Diabetes mellitus with ESRD (end-stage renal disease) (HCC)    type II   Dysphagia    Dyspnea    with activity   GERD (gastroesophageal reflux disease)    Glaucoma    Hyperlipidemia    Hypertension    Multiple myeloma (HCC)    Pneumonia    Stage 1 infiltrating ductal carcinoma of right female breast (HCC) 08/21/2015   ER+ PR+ HER 2 neu + (3+) T1cN0     Past Surgical History:  Procedure Laterality  Date   A/V FISTULAGRAM Right 07/05/2020   Procedure: A/V Fistulagram;  Surgeon: Sherren Kerns, MD;  Location: [facility];  Service: Cardiovascular;  Laterality: Right;   AV FISTULA PLACEMENT Left 11/22/2017   Procedure: ARTERIOVENOUS (AV) FISTULA CREATION LEFT ARM;  Surgeon: Sherren Kerns, MD;  Location: [facility] OR;  Service: Vascular;  Laterality: Left;   AV FISTULA PLACEMENT Right 04/04/2020   Procedure: RIGHT ARM ARTERIOVENOUS FISTULA CREATION;  Surgeon: Larina Earthly, MD;  Location: [facility];  Service: Vascular;  Laterality: Right;   AV FISTULA PLACEMENT Right 08/20/2020   Procedure: ARTERIOVENOUS (AV) FISTULA LIGATION RIGHT ARM;  Surgeon: Sherren Kerns, MD;  Location: [facility] OR;  Service: Vascular;  Laterality: Right;   BIOPSY  08/07/2016   Procedure: BIOPSY;  Surgeon: Corbin Ade, MD;  Location: [facility];  Service: Endoscopy;;  gastric ulcer biopsy   COLONOSCOPY     ESOPHAGOGASTRODUODENOSCOPY N/A 08/07/2016   LA Grade A esophagitis s/p dilation, small hiatal hernia, multiple gastric ulcers and erosions, duodenal erosions s/p biopsy. Negative H.pylori    ESOPHAGOGASTRODUODENOSCOPY N/A 11/27/2016   normal esophagus, previously noted gastric ulcers completely healed, normal duodenum.    ESOPHAGOGASTRODUODENOSCOPY (EGD) WITH PROPOFOL N/A 07/23/2020   Procedure: ESOPHAGOGASTRODUODENOSCOPY (EGD) WITH PROPOFOL;  Surgeon: Malissa Hippo, MD;  [facility];  Service: Endoscopy;  Laterality: N/A;  FISTULA SUPERFICIALIZATION Left 02/14/2018   Procedure: FISTULA SUPERFICIALIZATION LEFT ARM;  Surgeon: Chuck Hint, MD;  Location: [facility] OR;  Service: Vascular;  Laterality: Left;   FRACTURE SURGERY Right    ankle   HOT HEMOSTASIS  07/23/2020   Procedure: HOT HEMOSTASIS (ARGON PLASMA COAGULATION/BICAP);  Surgeon: Malissa Hippo, MD;  [facility];  Service: Endoscopy;;   INTRAMEDULLARY (IM) NAIL INTERTROCHANTERIC Right 07/12/2020   Procedure:  INTRAMEDULLARY (IM) NAIL INTERTROCHANTRIC;  Surgeon: Oliver Barre, MD;  Location: [facility];  Service: Orthopedics;  Laterality: Right;   IR FLUORO GUIDE CV LINE LEFT  12/21/2022   IR US GUIDE VASC ACCESS LEFT  12/21/2022   MALONEY DILATION N/A 08/07/2016   Procedure: Elease Hashimoto DILATION;  Surgeon: Corbin Ade, MD;  [facility];  Service: Endoscopy;  Laterality: N/A;   MASTECTOMY, PARTIAL Right    PERIPHERAL VASCULAR BALLOON ANGIOPLASTY Left 07/13/2019   Procedure: PERIPHERAL VASCULAR BALLOON ANGIOPLASTY;  Surgeon: Cephus Shelling, MD;  [facility]: [facility];  Service: Cardiovascular;  Laterality: Left;  arm fistulogram   PERIPHERAL VASCULAR BALLOON ANGIOPLASTY Right 05/22/2020   Procedure: PERIPHERAL VASCULAR BALLOON ANGIOPLASTY;  Surgeon: Leonie Douglas, MD;  Location: [facility];  Service: Cardiovascular;  Laterality: Right;  arm fistula   PERIPHERAL VASCULAR BALLOON ANGIOPLASTY Right 07/05/2020   Procedure: PERIPHERAL VASCULAR BALLOON ANGIOPLASTY;  Surgeon: Sherren Kerns, MD;  Location: [facility];  Service: Cardiovascular;  Laterality: Right;  arm fistula   PORT-A-CATH REMOVAL Left 11/22/2017   Procedure: REMOVAL PORT-A-CATH LEFT CHEST;  Surgeon: Sherren Kerns, MD;  Location: [facility] OR;  Service: Vascular;  Laterality: Left;   RETINAL DETACHMENT SURGERY Right    SCLEROTHERAPY  07/23/2020   Procedure: SCLEROTHERAPY;  Surgeon: Malissa Hippo, MD;  [facility]: [facility];  Service: Endoscopy;;   UPPER EXTREMITY VENOGRAPHY N/A 08/22/2020   Procedure: LEFT UPPER & CENTRAL VENOGRAPHY;  Surgeon: Cephus Shelling, MD;  Location: [facility];  Service: Cardiovascular;  Laterality: N/A;    Social History   Socioeconomic History   Marital status: Single    Spouse name: Not on file   Number of children: Not on file   Years of education: Not on file   Highest education level: Not on file  Occupational History   Occupation: retired    Tobacco Use   Smoking status: Never   Smokeless tobacco: Never  Vaping Use   Vaping status: Never Used  Substance and Sexual Activity   Alcohol use: No    Alcohol/week: 0.0 standard drinks of alcohol   Drug use: No   Sexual activity: Never  Other Topics Concern   Not on file  Social History Narrative   Long term resident of SNF    Social Determinants of Health   Financial Resource Strain: Low Risk  (01/09/2020)   Overall Financial Resource Strain (CARDIA)    Difficulty of Paying Living Expenses: Not very hard  Food Insecurity: No Food Insecurity (12/16/2022)   Hunger Vital Sign    Worried About Running Out of Food in the Last Year: Never true    Ran Out of Food in the Last Year: Never true  Transportation Needs: No Transportation Needs (12/16/2022)   PRAPARE - Administrator, Civil Service (Medical): No    Lack of Transportation (Non-Medical): No  Physical Activity: Inactive (01/09/2020)   Exercise Vital Sign    Days of Exercise per Week: 0 days  Minutes of Exercise per Session: 0 min  Stress: No Stress Concern Present (01/09/2020)   [employer] of Occupational Health - Occupational Stress Questionnaire    Feeling of Stress : Not at all  Social Connections: Moderately Isolated (01/09/2020)   Social Connection and Isolation Panel [NHANES]    Frequency of Communication with Friends and Family: More than three times a week    Frequency of Social Gatherings with Friends and Family: Once a week    Attends Religious Services: More than 4 times per year    Active Member of [employer] or Organizations: No    Attends Banker Meetings: Never    Marital Status: Never married  Intimate Partner Violence: Not At Risk (12/16/2022)   Humiliation, Afraid, Rape, and Kick questionnaire    Fear of Current or Ex-Partner: No    Emotionally Abused: No    Physically Abused: No    Sexually Abused: No   Family History  Problem Relation Age of Onset   Multiple myeloma  Sister    Brain cancer Sister    Dementia Mother        died at 27   Stroke Mother    Heart failure Mother    Diabetes Mother    Heart disease Father    Prostate cancer Brother    Colon cancer Neg Hx       VITAL SIGNS BP (!) 124/59   Pulse 74   Temp (!) 97.1 F (36.2 C)   Resp 20   Ht 5\' 3"  (1.6 m)   Wt 144 lb 3.2 oz (65.4 kg)   SpO2 98%   BMI 25.54 kg/m   Outpatient Encounter Medications as of 01/08/2023  Medication Sig   acetaminophen (TYLENOL) 325 MG tablet Take 650 mg by mouth 3 (three) times daily.   acyclovir (ZOVIRAX) 200 MG capsule Take 200 mg by mouth in the morning. (0800)   anastrozole (ARIMIDEX) 1 MG tablet TAKE 1 TABLET BY MOUTH DAILY   Calcium Carb-Cholecalciferol (CALCIUM + VITAMIN D3) 500-5 MG-MCG TABS Take 1 tablet by mouth daily.   melatonin 5 MG TABS Take 5 mg by mouth at bedtime.   multivitamin (RENA-VIT) TABS tablet Take 1 tablet by mouth at bedtime.   Nutritional Supplements (ENSURE ENLIVE PO) Take 237 mLs by mouth every evening.   ondansetron (ZOFRAN-ODT) 4 MG disintegrating tablet Take 4 mg by mouth daily before breakfast.   pantoprazole (PROTONIX) 40 MG tablet Take 40 mg by mouth 2 (two) times daily.   sennosides-docusate sodium (SENOKOT-S) 8.6-50 MG tablet Take 1 tablet by mouth 2 (two) times daily.   sertraline (ZOLOFT) 25 MG tablet Take 25 mg by mouth daily.   sucralfate (CARAFATE) 1 GM/10ML suspension Take 1 g by mouth in the morning, at noon, in the evening, and at bedtime.   Facility-Administered Encounter Medications as of 01/08/2023  Medication   lanreotide acetate (SOMATULINE DEPOT) 120 MG/0.5ML injection   lanreotide acetate (SOMATULINE DEPOT) 120 MG/0.5ML injection   lanreotide acetate (SOMATULINE DEPOT) 120 MG/0.5ML injection   lanreotide acetate (SOMATULINE DEPOT) 120 MG/0.5ML injection   octreotide (SANDOSTATIN LAR) 30 MG IM injection   octreotide (SANDOSTATIN LAR) 30 MG IM injection     SIGNIFICANT DIAGNOSTIC EXAMS  PREVIOUS   10-24-21: DEXA: t score -2.957  NO NEW EXAMS    LABS REVIEWED PREVIOUS     02-03-22: wbc 3.8; hgb 11.5; hct 37.6; mcv 96.4 plt 212; glucose 164; bun 28; creat 7.99; k+ 4.4; na++ 139; ca 8.3 gfr 5;  protein 6.7 albumin 3.7 mag 2.1 02-17-22: wbc 4.6; hgb 11.5; hct 36.5; mcv 93.6 plt 207; glucose 95; bun 23 creat 7.44 k+ 4.1; na++ 135; ca 7.8; gfr 5 protein 6.9; albumin 3.8  03-31-22: wbc 4.7; hgb 10.0; hct 32.2; mcv 96.4 plt 278; glucose 155; bun 28; creat 6.50; k+ 4.0; na++ 132; ca 8.0; gfr 6 protein 6.9 albumin 3.6 05-19-22: wbc 4.1; hgb 9.6; hct 30.9; mcv 98.7 plt 236; glucose 170; bun 33; creat 6.03; k+ 3.9; na++ 137; ca 8.1; gfr 6; protein 6.6 albumin 3.4; mag 2.1  06-23-22: wbc 4.9; hgb 9.1; hct 28.7 mcv 101.4 plt 286; glucose 158; bun 25; creat 5.85; k+ 3.9 na++ 136; ca 8.6 gfr 7 protein 6.8 albumin 3.3 mag 2.2 07-02-22: hgb A1c 5.1 07-07-22: wbc 4.9; hgb 10.3; hct 32.8; mcv 102.5 plt 265; glucose 141; bun 32; creat 6.15; k+ 4.3; na++ 136; ca 8.9; gfr 6 protein 7.2 albumin 3.7; mag 2.3 07-20-22: vitamin B 12: 660; tsh 1.836 08-04-22: wbc 4.6; hgb 10.7; ht 33.1; mcv 100.3 plt 234; glucose 120; bun 41; creat 6.46; k+ 4.2; na++ 135; ca 9.8; gfr 6; protein 7.3; albumin 3.7 mag 2.4 09-15-22: wbc 5.2; hgb 9.9; hct 31.0; mcv 103.3 plt 224; glucose 132; bun 22; creat 6.19; k+ 4.2; na++ 135; ca 9.5 gfr 6 protein 6.9 albumin 3.7     11-10-22: wbc 4.0; hgb 10.7; hct 33.4; mcv 102.1 plt 187; glucose 110; bun 28; creat 6.16; k+ 4.8; na++ 130; ca 9.0; gfr 6; protein 6.8 albumin 3.6 mag 2.4  12-16-22: wbc 6.7; hgb 12.4; hct 38.3; mcv 101.3 plt 201; glucose 123; bun 22; creat 4.81; k+ 4.1; na++ 133; ca 9.4; gfr 8; protein 7.9; albumin 4.1; blood culture: staphylococcus hominis  12-22-22: wbc 4.2; hgb 9.8; hct 30.1; mcv 100.0; plt 184; glucose 90; bun 81; creat 12.02; k+ 5.0; na++ 136; ca 8.8; gfr 3; phos 10.8; albumin 3.2   NO NEW LABS.   Review of Systems  Constitutional:  Negative for malaise/fatigue.   Respiratory:  Negative for cough and shortness of breath.   Cardiovascular:  Negative for chest pain, palpitations and leg swelling.  Gastrointestinal:  Negative for abdominal pain, constipation and heartburn.  Musculoskeletal:  Negative for back pain, joint pain and myalgias.  Skin: Negative.   Neurological:  Negative for dizziness.  Psychiatric/Behavioral:  The patient is not nervous/anxious.    Physical Exam Constitutional:      General: She is not in acute distress.    Appearance: She is well-developed. She is not diaphoretic.  Neck:     Thyroid: No thyromegaly.  Cardiovascular:     Rate and Rhythm: Normal rate and regular rhythm.     Pulses: Normal pulses.     Heart sounds: Normal heart sounds.  Pulmonary:     Effort: Pulmonary effort is normal. No respiratory distress.     Breath sounds: Normal breath sounds.  Abdominal:     General: Bowel sounds are normal. There is no distension.     Palpations: Abdomen is soft.     Tenderness: There is no abdominal tenderness.  Musculoskeletal:        General: Normal range of motion.     Cervical back: Neck supple.     Right lower leg: No edema.     Left lower leg: No edema.  Lymphadenopathy:     Cervical: No cervical adenopathy.  Skin:    General: Skin is warm and dry.     Comments: Tunneled dialysis access  Neurological:     Mental Status: She is alert. Mental status is at baseline.  Psychiatric:        Mood and Affect: Mood normal.     ASSESSMENT/ PLAN:  TODAY  Atherosclerotic peripheral vascular disease Chronic kidney disease with end stage renal disease on dialysis due to type 2 diabetes mellitus  Vascular dementia with behavioral disturbance   Will continue current medications Will continue current plan of care Will continue to  monitor her status.   Time spent with patient: 40 minutes: medications; activities; dietary    Synthia Innocent NP [facility]   call [phone]

## 2023-01-11 DIAGNOSIS — Z992 Dependence on renal dialysis: Secondary | ICD-10-CM | POA: Diagnosis not present

## 2023-01-11 DIAGNOSIS — N2581 Secondary hyperparathyroidism of renal origin: Secondary | ICD-10-CM | POA: Diagnosis not present

## 2023-01-11 DIAGNOSIS — N186 End stage renal disease: Secondary | ICD-10-CM | POA: Diagnosis not present

## 2023-01-12 LAB — IMMUNOFIXATION ELECTROPHORESIS
IgA: 456 mg/dL — ABNORMAL HIGH (ref 64–422)
IgG (Immunoglobin G), Serum: 856 mg/dL (ref 586–1602)
IgM (Immunoglobulin M), Srm: 14 mg/dL — ABNORMAL LOW (ref 26–217)
Total Protein ELP: 7 g/dL (ref 6.0–8.5)

## 2023-01-13 DIAGNOSIS — Z992 Dependence on renal dialysis: Secondary | ICD-10-CM | POA: Diagnosis not present

## 2023-01-13 DIAGNOSIS — N186 End stage renal disease: Secondary | ICD-10-CM | POA: Diagnosis not present

## 2023-01-13 DIAGNOSIS — N2581 Secondary hyperparathyroidism of renal origin: Secondary | ICD-10-CM | POA: Diagnosis not present

## 2023-01-15 DIAGNOSIS — L84 Corns and callosities: Secondary | ICD-10-CM | POA: Diagnosis not present

## 2023-01-15 DIAGNOSIS — N2581 Secondary hyperparathyroidism of renal origin: Secondary | ICD-10-CM | POA: Diagnosis not present

## 2023-01-15 DIAGNOSIS — N186 End stage renal disease: Secondary | ICD-10-CM | POA: Diagnosis not present

## 2023-01-15 DIAGNOSIS — L602 Onychogryphosis: Secondary | ICD-10-CM | POA: Diagnosis not present

## 2023-01-15 DIAGNOSIS — Z794 Long term (current) use of insulin: Secondary | ICD-10-CM | POA: Diagnosis not present

## 2023-01-15 DIAGNOSIS — E1151 Type 2 diabetes mellitus with diabetic peripheral angiopathy without gangrene: Secondary | ICD-10-CM | POA: Diagnosis not present

## 2023-01-15 DIAGNOSIS — Z992 Dependence on renal dialysis: Secondary | ICD-10-CM | POA: Diagnosis not present

## 2023-01-15 DIAGNOSIS — L603 Nail dystrophy: Secondary | ICD-10-CM | POA: Diagnosis not present

## 2023-01-18 DIAGNOSIS — N2581 Secondary hyperparathyroidism of renal origin: Secondary | ICD-10-CM | POA: Diagnosis not present

## 2023-01-18 DIAGNOSIS — Z992 Dependence on renal dialysis: Secondary | ICD-10-CM | POA: Diagnosis not present

## 2023-01-18 DIAGNOSIS — N186 End stage renal disease: Secondary | ICD-10-CM | POA: Diagnosis not present

## 2023-01-19 ENCOUNTER — Inpatient Hospital Stay: Payer: Medicare PPO | Attending: Hematology

## 2023-01-19 ENCOUNTER — Inpatient Hospital Stay: Payer: Medicare PPO

## 2023-01-19 VITALS — BP 129/66 | HR 97 | Temp 97.7°F | Resp 18 | Wt 138.8 lb

## 2023-01-19 DIAGNOSIS — Z5112 Encounter for antineoplastic immunotherapy: Secondary | ICD-10-CM | POA: Diagnosis not present

## 2023-01-19 DIAGNOSIS — C9000 Multiple myeloma not having achieved remission: Secondary | ICD-10-CM | POA: Insufficient documentation

## 2023-01-19 LAB — CBC WITH DIFFERENTIAL/PLATELET
Abs Immature Granulocytes: 0.01 10*3/uL (ref 0.00–0.07)
Basophils Absolute: 0 10*3/uL (ref 0.0–0.1)
Basophils Relative: 1 %
Eosinophils Absolute: 0.2 10*3/uL (ref 0.0–0.5)
Eosinophils Relative: 6 %
HCT: 31.2 % — ABNORMAL LOW (ref 36.0–46.0)
Hemoglobin: 9.8 g/dL — ABNORMAL LOW (ref 12.0–15.0)
Immature Granulocytes: 0 %
Lymphocytes Relative: 22 %
Lymphs Abs: 0.9 10*3/uL (ref 0.7–4.0)
MCH: 31.9 pg (ref 26.0–34.0)
MCHC: 31.4 g/dL (ref 30.0–36.0)
MCV: 101.6 fL — ABNORMAL HIGH (ref 80.0–100.0)
Monocytes Absolute: 0.4 10*3/uL (ref 0.1–1.0)
Monocytes Relative: 11 %
Neutro Abs: 2.5 10*3/uL (ref 1.7–7.7)
Neutrophils Relative %: 60 %
Platelets: 215 10*3/uL (ref 150–400)
RBC: 3.07 MIL/uL — ABNORMAL LOW (ref 3.87–5.11)
RDW: 14.9 % (ref 11.5–15.5)
WBC: 4.1 10*3/uL (ref 4.0–10.5)
nRBC: 0 % (ref 0.0–0.2)

## 2023-01-19 LAB — COMPREHENSIVE METABOLIC PANEL
ALT: 13 U/L (ref 0–44)
AST: 22 U/L (ref 15–41)
Albumin: 3.6 g/dL (ref 3.5–5.0)
Alkaline Phosphatase: 81 U/L (ref 38–126)
Anion gap: 16 — ABNORMAL HIGH (ref 5–15)
BUN: 29 mg/dL — ABNORMAL HIGH (ref 8–23)
CO2: 24 mmol/L (ref 22–32)
Calcium: 9.2 mg/dL (ref 8.9–10.3)
Chloride: 95 mmol/L — ABNORMAL LOW (ref 98–111)
Creatinine, Ser: 6.57 mg/dL — ABNORMAL HIGH (ref 0.44–1.00)
GFR, Estimated: 6 mL/min — ABNORMAL LOW (ref >60)
Glucose, Bld: 149 mg/dL — ABNORMAL HIGH (ref 70–99)
Potassium: 4.5 mmol/L (ref 3.5–5.1)
Sodium: 135 mmol/L (ref 135–145)
Total Bilirubin: 0.3 mg/dL (ref <1.2)
Total Protein: 7.2 g/dL (ref 6.5–8.1)

## 2023-01-19 LAB — MAGNESIUM: Magnesium: 2.5 mg/dL — ABNORMAL HIGH (ref 1.7–2.4)

## 2023-01-19 MED ORDER — PROCHLORPERAZINE MALEATE 10 MG PO TABS
10.0000 mg | ORAL_TABLET | Freq: Once | ORAL | Status: AC
  Administered 2023-01-19: 10 mg via ORAL
  Filled 2023-01-19: qty 1

## 2023-01-19 MED ORDER — BORTEZOMIB CHEMO SQ INJECTION 3.5 MG (2.5MG/ML)
1.2000 mg/m2 | Freq: Once | INTRAMUSCULAR | Status: AC
  Administered 2023-01-19: 2 mg via SUBCUTANEOUS
  Filled 2023-01-19: qty 0.8

## 2023-01-19 NOTE — Progress Notes (Signed)
Patient tolerated Velcade injection with no complaints voiced. Lab work reviewed. See MAR for details. Injection site clean and dry with no bruising or swelling noted. Patient stable during and after injection. Band aid applied. VSS. Patient left in satisfactory condition with no s/s of distress noted. 

## 2023-01-19 NOTE — Patient Instructions (Signed)
[facility] - A DEPT OF [facility]  Discharge Instructions: Thank you for choosing [facility] to provide your oncology and hematology care.  If you have a lab appointment with the Cancer Center - please note that after April 8th, 2024, all labs will be drawn in the cancer center.  You do not have to check in or register with the main entrance as you have in the past but will complete your check-in in the cancer center.  Wear comfortable clothing and clothing appropriate for easy access to any Portacath or PICC line.   We strive to give you quality time with your provider. You may need to reschedule your appointment if you arrive late (15 or more minutes).  Arriving late affects you and other patients whose appointments are after yours.  Also, if you miss three or more appointments without notifying the office, you may be dismissed from the clinic at the provider's discretion.      For prescription refill requests, have your pharmacy contact our office and allow 72 hours for refills to be completed.    Today you received the following chemotherapy and/or immunotherapy agents Velcade, return as scheduled.   To help prevent nausea and vomiting after your treatment, we encourage you to take your nausea medication as directed.  BELOW ARE SYMPTOMS THAT SHOULD BE REPORTED IMMEDIATELY: *FEVER GREATER THAN 100.4 F (38 C) OR HIGHER *CHILLS OR SWEATING *NAUSEA AND VOMITING THAT IS NOT CONTROLLED WITH YOUR NAUSEA MEDICATION *UNUSUAL SHORTNESS OF BREATH *UNUSUAL BRUISING OR BLEEDING *URINARY PROBLEMS (pain or burning when urinating, or frequent urination) *BOWEL PROBLEMS (unusual diarrhea, constipation, pain near the anus) TENDERNESS IN MOUTH AND THROAT WITH OR WITHOUT PRESENCE OF ULCERS (sore throat, sores in mouth, or a toothache) UNUSUAL RASH, SWELLING OR PAIN  UNUSUAL VAGINAL DISCHARGE OR ITCHING   Items with * indicate a potential emergency and  should be followed up as soon as possible or go to the Emergency Department if any problems should occur.  Please show the CHEMOTHERAPY ALERT CARD or IMMUNOTHERAPY ALERT CARD at check-in to the Emergency Department and triage nurse.  Should you have questions after your visit or need to cancel or reschedule your appointment, please contact [facility] - A DEPT OF Eligha Bridegroom [facility] [phone]  and follow the prompts.  Office hours are 8:00 a.m. to 4:30 p.m. Monday - Friday. Please note that voicemails left after 4:00 p.m. may not be returned until the following business day.  We are closed weekends and major holidays. You have access to a nurse at all times for urgent questions. Please call the main number to the clinic [phone] and follow the prompts.  For any non-urgent questions, you may also contact your provider using MyChart. We now offer e-Visits for anyone 19 and older to request care online for non-urgent symptoms. For details visit [email or web address].   Also download the MyChart app! Go to the app store, search "MyChart", open the app, select [place], and log in with your MyChart username and password.

## 2023-01-20 DIAGNOSIS — N2581 Secondary hyperparathyroidism of renal origin: Secondary | ICD-10-CM | POA: Diagnosis not present

## 2023-01-20 DIAGNOSIS — Z992 Dependence on renal dialysis: Secondary | ICD-10-CM | POA: Diagnosis not present

## 2023-01-20 DIAGNOSIS — N186 End stage renal disease: Secondary | ICD-10-CM | POA: Diagnosis not present

## 2023-01-22 DIAGNOSIS — N2581 Secondary hyperparathyroidism of renal origin: Secondary | ICD-10-CM | POA: Diagnosis not present

## 2023-01-22 DIAGNOSIS — Z992 Dependence on renal dialysis: Secondary | ICD-10-CM | POA: Diagnosis not present

## 2023-01-22 DIAGNOSIS — N186 End stage renal disease: Secondary | ICD-10-CM | POA: Diagnosis not present

## 2023-01-25 DIAGNOSIS — N2581 Secondary hyperparathyroidism of renal origin: Secondary | ICD-10-CM | POA: Diagnosis not present

## 2023-01-25 DIAGNOSIS — Z992 Dependence on renal dialysis: Secondary | ICD-10-CM | POA: Diagnosis not present

## 2023-01-25 DIAGNOSIS — N186 End stage renal disease: Secondary | ICD-10-CM | POA: Diagnosis not present

## 2023-01-26 ENCOUNTER — Non-Acute Institutional Stay (SKILLED_NURSING_FACILITY): Payer: Self-pay | Admitting: Internal Medicine

## 2023-01-26 ENCOUNTER — Encounter: Payer: Self-pay | Admitting: Internal Medicine

## 2023-01-26 DIAGNOSIS — E1122 Type 2 diabetes mellitus with diabetic chronic kidney disease: Secondary | ICD-10-CM | POA: Diagnosis not present

## 2023-01-26 DIAGNOSIS — Z992 Dependence on renal dialysis: Secondary | ICD-10-CM

## 2023-01-26 DIAGNOSIS — Z170 Estrogen receptor positive status [ER+]: Secondary | ICD-10-CM | POA: Diagnosis not present

## 2023-01-26 DIAGNOSIS — C50111 Malignant neoplasm of central portion of right female breast: Secondary | ICD-10-CM

## 2023-01-26 DIAGNOSIS — R200 Anesthesia of skin: Secondary | ICD-10-CM | POA: Diagnosis not present

## 2023-01-26 DIAGNOSIS — N186 End stage renal disease: Secondary | ICD-10-CM

## 2023-01-26 DIAGNOSIS — F01518 Vascular dementia, unspecified severity, with other behavioral disturbance: Secondary | ICD-10-CM | POA: Diagnosis not present

## 2023-01-26 DIAGNOSIS — R202 Paresthesia of skin: Secondary | ICD-10-CM | POA: Diagnosis not present

## 2023-01-26 NOTE — Assessment & Plan Note (Signed)
Staff reports no behavioral issues at this time.  Even though we have met on multiple occasions; she asked me if I were her doctor in the hospital.

## 2023-01-26 NOTE — Assessment & Plan Note (Signed)
Arimidex chemotherapy is maintenance therapy.  Oncology follow-up is scheduled.

## 2023-01-26 NOTE — Progress Notes (Unsigned)
NURSING HOME LOCATION:  [facility] ROOM NUMBER:  [id number]  CODE STATUS:  DNR  PCP:  Synthia Innocent NP  This is a nursing facility follow up visit of chronic medical diagnoses & to document compliance with Regulation 483.30 (c) in The Long Term Care Survey Manual Phase 2 which mandates caregiver visit ( visits can alternate among physician, PA or NP as per statutes) within 10 days of 30 days / 60 days/ 90 days post admission to SNF date    Interim medical record and care since last SNF visit was updated with review of diagnostic studies and change in clinical status since last visit were documented.  HPI: She is a permanent resident of this facility with medical diagnoses of ESRD on HD; GERD;hyperlipidemia;history of breast cancer; on Arimidex; Multiple Myeloma; essential hypertension ; & diabetes with vascular complications.  Labs 01/19/2023 revealed creatinine of 6.57 & GFR of 6; ranges have been  4.81 - 12.02. & 3-7.Chronic anemia ranges 9.8- 12.4/ 28.7- 38.3.  Velcade infusion completed 01/19/23 w/o complication.    Review of systems: Dementia invalidated responses.  Complete review of systems was answered with her monosyllabic "no."  Her only complaint was that her fingers get numb at night.  She was able to tell me the day she goes to dialysis.  She asked me if I had been her doctor when she was hospitalized.  She does describe oliguria as expected at hemodialysis.  Constitutional: No fever, significant weight change, fatigue  Eyes: No redness, discharge, pain, vision change ENT/mouth: No nasal congestion,  purulent discharge, earache, change in hearing, sore throat  Cardiovascular: No chest pain, palpitations, paroxysmal nocturnal dyspnea, claudication, edema  Respiratory: No cough, sputum production, hemoptysis, DOE, significant snoring, apnea   Gastrointestinal: No heartburn, dysphagia, abdominal pain, nausea /vomiting, rectal bleeding, melena, change in  bowels Genitourinary: No dysuria, hematuria, pyuria, incontinence, nocturia Musculoskeletal: No joint stiffness, joint swelling, weakness, pain Dermatologic: No rash, pruritus, change in appearance of skin Neurologic: No dizziness, headache, syncope, seizures, numbness, tingling Psychiatric: No significant anxiety, depression, insomnia, anorexia Endocrine: No change in hair/skin/nails, excessive thirst, excessive hunger, excessive urination  Hematologic/lymphatic: No significant bruising, lymphadenopathy, abnormal bleeding Allergy/immunology: No itchy/watery eyes, significant sneezing, urticaria, angioedema  Physical exam:  Pertinent or positive findings: Affect is markedly flat.  Facies tend to be blank.  Eyebrows are decreased in density.  Teeth were coated.  She has a grade 1 systolic murmur at the base.  Breath sounds are decreased.  Abdomen is protuberant.  Pedal pulses are not palpable.  She has nonpitting diffuse edema of the lower extremities.  She is wearing compression hose.  Deep tendon reflexes are 1/2+ at the upper extremities.  Tinel's test for carpal tunnel syndrome is negative. General appearance: Adequately nourished; no acute distress, increased work of breathing is present.   Lymphatic: No lymphadenopathy about the head, neck, axilla. Eyes: No conjunctival inflammation or lid edema is present. There is no scleral icterus. Ears:  External ear exam shows no significant lesions or deformities.   Nose:  External nasal examination shows no deformity or inflammation. Nasal mucosa are pink and moist without lesions, exudates Oral exam:  Lips and gums are healthy appearing. There is no oropharyngeal erythema or exudate. Neck:  No thyromegaly, masses, tenderness noted.    Heart:  Normal rate and regular rhythm. S1 and S2 normal without gallop, murmur, click, rub .  Lungs: Chest clear to auscultation without wheezes, rhonchi, rales, rubs. Abdomen: Bowel sounds are normal.  Abdomen is  soft and nontender with no organomegaly, hernias, masses. GU: Deferred  Extremities:  No cyanosis, clubbing, edema  Neurologic exam : Cn 2-7 intact Strength equal  in upper & lower extremities Balance, Rhomberg, finger to nose testing could not be completed due to clinical state Deep tendon reflexes are equal Skin: Warm & dry w/o tenting. No significant lesions or rash.  See summary under each active problem in the Problem List with associated updated therapeutic plan

## 2023-01-26 NOTE — Patient Instructions (Signed)
See assessment and plan under each diagnosis in the problem list and acutely for this visit 

## 2023-01-26 NOTE — Assessment & Plan Note (Signed)
The nocturnal numbness and tingling persist.  TSH was therapeutic at 1.836.  This should be updated with the next SNF blood draw.  In May

## 2023-01-26 NOTE — Assessment & Plan Note (Signed)
She is on hemodialysis 3 times a week.  GFR range from 3-7 and creatinine 4.81 up to 12.2.  Most recent values revealed a GFR of 6 on a creatinine of 6.57. As expected the ESRD on hemodialysis associated with chronic anemia with H/H ranging from 9.8-12.4/28.7-38.3.  The normal values are an outlier.

## 2023-01-27 DIAGNOSIS — N186 End stage renal disease: Secondary | ICD-10-CM | POA: Diagnosis not present

## 2023-01-27 DIAGNOSIS — Z992 Dependence on renal dialysis: Secondary | ICD-10-CM | POA: Diagnosis not present

## 2023-01-27 DIAGNOSIS — N2581 Secondary hyperparathyroidism of renal origin: Secondary | ICD-10-CM | POA: Diagnosis not present

## 2023-01-29 DIAGNOSIS — Z992 Dependence on renal dialysis: Secondary | ICD-10-CM | POA: Diagnosis not present

## 2023-01-29 DIAGNOSIS — N2581 Secondary hyperparathyroidism of renal origin: Secondary | ICD-10-CM | POA: Diagnosis not present

## 2023-01-29 DIAGNOSIS — N186 End stage renal disease: Secondary | ICD-10-CM | POA: Diagnosis not present

## 2023-02-01 DIAGNOSIS — N186 End stage renal disease: Secondary | ICD-10-CM | POA: Diagnosis not present

## 2023-02-01 DIAGNOSIS — Z992 Dependence on renal dialysis: Secondary | ICD-10-CM | POA: Diagnosis not present

## 2023-02-01 DIAGNOSIS — N2581 Secondary hyperparathyroidism of renal origin: Secondary | ICD-10-CM | POA: Diagnosis not present

## 2023-02-02 ENCOUNTER — Inpatient Hospital Stay: Payer: Medicare PPO

## 2023-02-02 VITALS — BP 127/66 | HR 95 | Temp 98.0°F | Resp 20 | Wt 138.6 lb

## 2023-02-02 DIAGNOSIS — Z5112 Encounter for antineoplastic immunotherapy: Secondary | ICD-10-CM | POA: Diagnosis not present

## 2023-02-02 DIAGNOSIS — C9000 Multiple myeloma not having achieved remission: Secondary | ICD-10-CM | POA: Diagnosis not present

## 2023-02-02 LAB — CBC WITH DIFFERENTIAL/PLATELET
Abs Immature Granulocytes: 0.01 10*3/uL (ref 0.00–0.07)
Basophils Absolute: 0 10*3/uL (ref 0.0–0.1)
Basophils Relative: 1 %
Eosinophils Absolute: 0.2 10*3/uL (ref 0.0–0.5)
Eosinophils Relative: 5 %
HCT: 29.5 % — ABNORMAL LOW (ref 36.0–46.0)
Hemoglobin: 9.4 g/dL — ABNORMAL LOW (ref 12.0–15.0)
Immature Granulocytes: 0 %
Lymphocytes Relative: 21 %
Lymphs Abs: 0.9 10*3/uL (ref 0.7–4.0)
MCH: 32.8 pg (ref 26.0–34.0)
MCHC: 31.9 g/dL (ref 30.0–36.0)
MCV: 102.8 fL — ABNORMAL HIGH (ref 80.0–100.0)
Monocytes Absolute: 0.5 10*3/uL (ref 0.1–1.0)
Monocytes Relative: 11 %
Neutro Abs: 2.6 10*3/uL (ref 1.7–7.7)
Neutrophils Relative %: 62 %
Platelets: 200 10*3/uL (ref 150–400)
RBC: 2.87 MIL/uL — ABNORMAL LOW (ref 3.87–5.11)
RDW: 15.2 % (ref 11.5–15.5)
WBC: 4.2 10*3/uL (ref 4.0–10.5)
nRBC: 0 % (ref 0.0–0.2)

## 2023-02-02 LAB — COMPREHENSIVE METABOLIC PANEL
ALT: 13 U/L (ref 0–44)
AST: 22 U/L (ref 15–41)
Albumin: 3.6 g/dL (ref 3.5–5.0)
Alkaline Phosphatase: 81 U/L (ref 38–126)
Anion gap: 12 (ref 5–15)
BUN: 27 mg/dL — ABNORMAL HIGH (ref 8–23)
CO2: 28 mmol/L (ref 22–32)
Calcium: 9.4 mg/dL (ref 8.9–10.3)
Chloride: 95 mmol/L — ABNORMAL LOW (ref 98–111)
Creatinine, Ser: 6.28 mg/dL — ABNORMAL HIGH (ref 0.44–1.00)
GFR, Estimated: 6 mL/min — ABNORMAL LOW (ref >60)
Glucose, Bld: 192 mg/dL — ABNORMAL HIGH (ref 70–99)
Potassium: 4.4 mmol/L (ref 3.5–5.1)
Sodium: 135 mmol/L (ref 135–145)
Total Bilirubin: 0.4 mg/dL (ref <1.2)
Total Protein: 7.3 g/dL (ref 6.5–8.1)

## 2023-02-02 LAB — MAGNESIUM: Magnesium: 2.3 mg/dL (ref 1.7–2.4)

## 2023-02-02 MED ORDER — PROCHLORPERAZINE MALEATE 10 MG PO TABS
10.0000 mg | ORAL_TABLET | Freq: Once | ORAL | Status: AC
Start: 2023-02-02 — End: 2023-02-02
  Administered 2023-02-02: 10 mg via ORAL
  Filled 2023-02-02: qty 1

## 2023-02-02 MED ORDER — BORTEZOMIB CHEMO SQ INJECTION 3.5 MG (2.5MG/ML)
1.2000 mg/m2 | Freq: Once | INTRAMUSCULAR | Status: AC
Start: 2023-02-02 — End: 2023-02-02
  Administered 2023-02-02: 2 mg via SUBCUTANEOUS
  Filled 2023-02-02: qty 0.8

## 2023-02-02 NOTE — Progress Notes (Signed)
Patient presents today for Velcade injection.  Patient is in satisfactory condition with no new complaints voiced.  Vital signs are stable.  Labs reviewed and all labs are within treatment parameters.  We will proceed with injection per MD orders.    Patient tolerated injection well with no complaints voiced.  Patient left via wheelchair with [facility] staff in stable condition.  Vital signs stable at discharge.  Follow up as scheduled.

## 2023-02-02 NOTE — Patient Instructions (Signed)
[facility] - [facility]  Discharge Instructions: Thank you for choosing [facility] to provide your oncology and hematology care.  If you have a lab appointment with the Cancer Center - please note that after April 8th, 2024, all labs will be drawn in the cancer center.  You do not have to check in or register with the main entrance as you have in the past but will complete your check-in in the cancer center.  Wear comfortable clothing and clothing appropriate for easy access to any Portacath or PICC line.   We strive to give you quality time with your provider. You may need to reschedule your appointment if you arrive late (15 or more minutes).  Arriving late affects you and other patients whose appointments are after yours.  Also, if you miss three or more appointments without notifying the office, you may be dismissed from the clinic at the provider's discretion.      For prescription refill requests, have your pharmacy contact our office and allow 72 hours for refills to be completed.    Today you received the following chemotherapy and/or immunotherapy agents Velcade.  Bortezomib Injection What is this medication? BORTEZOMIB (bor TEZ oh mib) treats lymphoma. It may also be used to treat multiple myeloma, a type of bone marrow cancer. It works by blocking a protein that causes cancer cells to grow and multiply. This helps to slow or stop the spread of cancer cells. This medicine may be used for other purposes; ask your health care provider or pharmacist if you have questions. COMMON BRAND NAME(S): Velcade What should I tell my care team before I take this medication? They need to know if you have any of these conditions: Dehydration Diabetes Heart disease Liver disease Tingling of the fingers or toes or other nerve disorder An unusual or allergic reaction to bortezomib, other medications, foods, dyes, or preservatives If you or  your partner are pregnant or trying to get pregnant Breastfeeding How should I use this medication? This medication is injected into a vein or under the skin. It is given by your care team in a hospital or clinic setting. Talk to your care team about the use of this medication in children. Special care may be needed. Overdosage: If you think you have taken too much of this medicine contact a poison control center or emergency room at once. NOTE: This medicine is only for you. Do not share this medicine with others. What if I miss a dose? Keep appointments for follow-up doses. It is important not to miss your dose. Call your care team if you are unable to keep an appointment. What may interact with this medication? Ketoconazole Rifampin This list may not describe all possible interactions. Give your health care provider a list of all the medicines, herbs, non-prescription drugs, or dietary supplements you use. Also tell them if you smoke, drink alcohol, or use illegal drugs. Some items may interact with your medicine. What should I watch for while using this medication? Your condition will be monitored carefully while you are receiving this medication. You may need blood work while taking this medication. This medication may affect your coordination, reaction time, or judgment. Do not drive or operate machinery until you know how this medication affects you. Sit up or stand slowly to reduce the risk of dizzy or fainting spells. Drinking alcohol with this medication can increase the risk of these side effects. This medication  may increase your risk of getting an infection. Call your care team for advice if you get a fever, chills, sore throat, or other symptoms of a cold or flu. Do not treat yourself. Try to avoid being around people who are sick. Check with your care team if you have severe diarrhea, nausea, and vomiting, or if you sweat a lot. The loss of too much body fluid may make it dangerous  for you to take this medication. Talk to your care team if you may be pregnant. Serious birth defects can occur if you take this medication during pregnancy and for 7 months after the last dose. You will need a negative pregnancy test before starting this medication. Contraception is recommended while taking this medication and for 7 months after the last dose. Your care team can help you find the option that works for you. If your partner can get pregnant, use a condom during sex while taking this medication and for 4 months after the last dose. Do not breastfeed while taking this medication and for 2 months after the last dose. This medication may cause infertility. Talk to your care team if you are concerned about your fertility. What side effects may I notice from receiving this medication? Side effects that you should report to your care team as soon as possible: Allergic reactions--skin rash, itching, hives, swelling of the face, lips, tongue, or throat Bleeding--bloody or black, tar-like stools, vomiting blood or Gauri Galvao material that looks like coffee grounds, red or dark Grey Schlauch urine, small red or purple spots on skin, unusual bruising or bleeding Bleeding in the brain--severe headache, stiff neck, confusion, dizziness, change in vision, numbness or weakness of the face, arm, or leg, trouble speaking, trouble walking, vomiting Bowel blockage--stomach cramping, unable to have a bowel movement or pass gas, loss of appetite, vomiting Heart failure--shortness of breath, swelling of the ankles, feet, or hands, sudden weight gain, unusual weakness or fatigue Infection--fever, chills, cough, sore throat, wounds that don't heal, pain or trouble when passing urine, general feeling of discomfort or being unwell Liver injury--right upper belly pain, loss of appetite, nausea, light-colored stool, dark yellow or Alaysha Jefcoat urine, yellowing skin or eyes, unusual weakness or fatigue Low blood pressure--dizziness,  feeling faint or lightheaded, blurry vision Lung injury--shortness of breath or trouble breathing, cough, spitting up blood, chest pain, fever Pain, tingling, or numbness in the hands or feet Severe or prolonged diarrhea Stomach pain, bloody diarrhea, pale skin, unusual weakness or fatigue, decrease in the amount of urine, which may be signs of hemolytic uremic syndrome Sudden and severe headache, confusion, change in vision, seizures, which may be signs of posterior reversible encephalopathy syndrome (PRES) TTP--purple spots on the skin or inside the mouth, pale skin, yellowing skin or eyes, unusual weakness or fatigue, fever, fast or irregular heartbeat, confusion, change in vision, trouble speaking, trouble walking Tumor lysis syndrome (TLS)--nausea, vomiting, diarrhea, decrease in the amount of urine, dark urine, unusual weakness or fatigue, confusion, muscle pain or cramps, fast or irregular heartbeat, joint pain Side effects that usually do not require medical attention (report to your care team if they continue or are bothersome): Constipation Diarrhea Fatigue Loss of appetite Nausea This list may not describe all possible side effects. Call your doctor for medical advice about side effects. You may report side effects to FDA at 1-800-FDA-1088. Where should I keep my medication? This medication is given in a hospital or clinic. It will not be stored at home. NOTE: This sheet is a  summary. It may not cover all possible information. If you have questions about this medicine, talk to your doctor, pharmacist, or health care provider.  2024 Elsevier/Gold Standard (2021-07-29 00:00:00)        To help prevent nausea and vomiting after your treatment, we encourage you to take your nausea medication as directed.  BELOW ARE SYMPTOMS THAT SHOULD BE REPORTED IMMEDIATELY: *FEVER GREATER THAN 100.4 F (38 C) OR HIGHER *CHILLS OR SWEATING *NAUSEA AND VOMITING THAT IS NOT CONTROLLED WITH YOUR  NAUSEA MEDICATION *UNUSUAL SHORTNESS OF BREATH *UNUSUAL BRUISING OR BLEEDING *URINARY PROBLEMS (pain or burning when urinating, or frequent urination) *BOWEL PROBLEMS (unusual diarrhea, constipation, pain near the anus) TENDERNESS IN MOUTH AND THROAT WITH OR WITHOUT PRESENCE OF ULCERS (sore throat, sores in mouth, or a toothache) UNUSUAL RASH, SWELLING OR PAIN  UNUSUAL VAGINAL DISCHARGE OR ITCHING   Items with * indicate a potential emergency and should be followed up as soon as possible or go to the Emergency Department if any problems should occur.  Please show the CHEMOTHERAPY ALERT CARD or IMMUNOTHERAPY ALERT CARD at check-in to the Emergency Department and triage nurse.  Should you have questions after your visit or need to cancel or reschedule your appointment, please contact [place] [facility] - A DEPT OF Eligha Bridegroom [facility] [phone]  and follow the prompts.  Office hours are 8:00 a.m. to 4:30 p.m. Monday - Friday. Please note that voicemails left after 4:00 p.m. may not be returned until the following business day.  We are closed weekends and major holidays. You have access to a nurse at all times for urgent questions. Please call the main number to the clinic [phone] and follow the prompts.  For any non-urgent questions, you may also contact your provider using MyChart. We now offer e-Visits for anyone 22 and older to request care online for non-urgent symptoms. For details visit [email or web address].   Also download the MyChart app! Go to the app store, search "MyChart", open the app, select , and log in with your MyChart username and password.

## 2023-02-03 DIAGNOSIS — Z992 Dependence on renal dialysis: Secondary | ICD-10-CM | POA: Diagnosis not present

## 2023-02-03 DIAGNOSIS — N186 End stage renal disease: Secondary | ICD-10-CM | POA: Diagnosis not present

## 2023-02-03 DIAGNOSIS — N2581 Secondary hyperparathyroidism of renal origin: Secondary | ICD-10-CM | POA: Diagnosis not present

## 2023-02-05 DIAGNOSIS — N186 End stage renal disease: Secondary | ICD-10-CM | POA: Diagnosis not present

## 2023-02-05 DIAGNOSIS — Z992 Dependence on renal dialysis: Secondary | ICD-10-CM | POA: Diagnosis not present

## 2023-02-05 DIAGNOSIS — N2581 Secondary hyperparathyroidism of renal origin: Secondary | ICD-10-CM | POA: Diagnosis not present

## 2023-02-07 DIAGNOSIS — Z992 Dependence on renal dialysis: Secondary | ICD-10-CM | POA: Diagnosis not present

## 2023-02-07 DIAGNOSIS — N186 End stage renal disease: Secondary | ICD-10-CM | POA: Diagnosis not present

## 2023-02-08 DIAGNOSIS — N186 End stage renal disease: Secondary | ICD-10-CM | POA: Diagnosis not present

## 2023-02-08 DIAGNOSIS — Z992 Dependence on renal dialysis: Secondary | ICD-10-CM | POA: Diagnosis not present

## 2023-02-10 DIAGNOSIS — N186 End stage renal disease: Secondary | ICD-10-CM | POA: Diagnosis not present

## 2023-02-10 DIAGNOSIS — Z992 Dependence on renal dialysis: Secondary | ICD-10-CM | POA: Diagnosis not present

## 2023-02-12 DIAGNOSIS — Z992 Dependence on renal dialysis: Secondary | ICD-10-CM | POA: Diagnosis not present

## 2023-02-12 DIAGNOSIS — N186 End stage renal disease: Secondary | ICD-10-CM | POA: Diagnosis not present

## 2023-02-15 DIAGNOSIS — Z992 Dependence on renal dialysis: Secondary | ICD-10-CM | POA: Diagnosis not present

## 2023-02-15 DIAGNOSIS — N186 End stage renal disease: Secondary | ICD-10-CM | POA: Diagnosis not present

## 2023-02-16 ENCOUNTER — Inpatient Hospital Stay: Payer: Medicare PPO

## 2023-02-16 ENCOUNTER — Inpatient Hospital Stay: Payer: Medicare PPO | Attending: Hematology

## 2023-02-16 VITALS — BP 118/62 | HR 89 | Temp 98.1°F | Resp 18 | Wt 134.2 lb

## 2023-02-16 DIAGNOSIS — C9000 Multiple myeloma not having achieved remission: Secondary | ICD-10-CM | POA: Insufficient documentation

## 2023-02-16 DIAGNOSIS — N186 End stage renal disease: Secondary | ICD-10-CM | POA: Diagnosis not present

## 2023-02-16 DIAGNOSIS — Z1732 Human epidermal growth factor receptor 2 negative status: Secondary | ICD-10-CM | POA: Diagnosis not present

## 2023-02-16 DIAGNOSIS — C50411 Malignant neoplasm of upper-outer quadrant of right female breast: Secondary | ICD-10-CM | POA: Insufficient documentation

## 2023-02-16 DIAGNOSIS — Z170 Estrogen receptor positive status [ER+]: Secondary | ICD-10-CM | POA: Diagnosis not present

## 2023-02-16 DIAGNOSIS — Z992 Dependence on renal dialysis: Secondary | ICD-10-CM | POA: Insufficient documentation

## 2023-02-16 DIAGNOSIS — Z9011 Acquired absence of right breast and nipple: Secondary | ICD-10-CM | POA: Diagnosis not present

## 2023-02-16 DIAGNOSIS — Z1721 Progesterone receptor positive status: Secondary | ICD-10-CM | POA: Diagnosis not present

## 2023-02-16 DIAGNOSIS — Z5112 Encounter for antineoplastic immunotherapy: Secondary | ICD-10-CM | POA: Insufficient documentation

## 2023-02-16 DIAGNOSIS — Z79811 Long term (current) use of aromatase inhibitors: Secondary | ICD-10-CM | POA: Insufficient documentation

## 2023-02-16 LAB — CBC WITH DIFFERENTIAL/PLATELET
Abs Immature Granulocytes: 0.01 10*3/uL (ref 0.00–0.07)
Basophils Absolute: 0 10*3/uL (ref 0.0–0.1)
Basophils Relative: 1 %
Eosinophils Absolute: 0.2 10*3/uL (ref 0.0–0.5)
Eosinophils Relative: 5 %
HCT: 28.8 % — ABNORMAL LOW (ref 36.0–46.0)
Hemoglobin: 9.2 g/dL — ABNORMAL LOW (ref 12.0–15.0)
Immature Granulocytes: 0 %
Lymphocytes Relative: 17 %
Lymphs Abs: 0.9 10*3/uL (ref 0.7–4.0)
MCH: 33.5 pg (ref 26.0–34.0)
MCHC: 31.9 g/dL (ref 30.0–36.0)
MCV: 104.7 fL — ABNORMAL HIGH (ref 80.0–100.0)
Monocytes Absolute: 0.4 10*3/uL (ref 0.1–1.0)
Monocytes Relative: 8 %
Neutro Abs: 3.4 10*3/uL (ref 1.7–7.7)
Neutrophils Relative %: 69 %
Platelets: 215 10*3/uL (ref 150–400)
RBC: 2.75 MIL/uL — ABNORMAL LOW (ref 3.87–5.11)
RDW: 16.9 % — ABNORMAL HIGH (ref 11.5–15.5)
WBC: 4.9 10*3/uL (ref 4.0–10.5)
nRBC: 0 % (ref 0.0–0.2)

## 2023-02-16 LAB — COMPREHENSIVE METABOLIC PANEL
ALT: 13 U/L (ref 0–44)
AST: 18 U/L (ref 15–41)
Albumin: 3.9 g/dL (ref 3.5–5.0)
Alkaline Phosphatase: 94 U/L (ref 38–126)
Anion gap: 16 — ABNORMAL HIGH (ref 5–15)
BUN: 32 mg/dL — ABNORMAL HIGH (ref 8–23)
CO2: 22 mmol/L (ref 22–32)
Calcium: 9.8 mg/dL (ref 8.9–10.3)
Chloride: 98 mmol/L (ref 98–111)
Creatinine, Ser: 8.17 mg/dL — ABNORMAL HIGH (ref 0.44–1.00)
GFR, Estimated: 4 mL/min — ABNORMAL LOW (ref >60)
Glucose, Bld: 194 mg/dL — ABNORMAL HIGH (ref 70–99)
Potassium: 5.1 mmol/L (ref 3.5–5.1)
Sodium: 136 mmol/L (ref 135–145)
Total Bilirubin: 0.3 mg/dL (ref <1.2)
Total Protein: 7.5 g/dL (ref 6.5–8.1)

## 2023-02-16 LAB — MAGNESIUM: Magnesium: 2.6 mg/dL — ABNORMAL HIGH (ref 1.7–2.4)

## 2023-02-16 MED ORDER — PROCHLORPERAZINE MALEATE 10 MG PO TABS
10.0000 mg | ORAL_TABLET | Freq: Once | ORAL | Status: AC
  Administered 2023-02-16: 10 mg via ORAL
  Filled 2023-02-16: qty 1

## 2023-02-16 MED ORDER — BORTEZOMIB CHEMO SQ INJECTION 3.5 MG (2.5MG/ML)
1.2000 mg/m2 | Freq: Once | INTRAMUSCULAR | Status: AC
Start: 2023-02-16 — End: 2023-02-16
  Administered 2023-02-16: 2 mg via SUBCUTANEOUS
  Filled 2023-02-16: qty 0.8

## 2023-02-16 NOTE — Patient Instructions (Signed)
[facility] [place] [facility]  Discharge Instructions: Thank you for choosing [facility] to provide your oncology and hematology care.  If you have a lab appointment with the Cancer Center - please note that after April 8th, 2024, all labs will be drawn in the cancer center.  You do not have to check in or register with the main entrance as you have in the past but will complete your check-in in the cancer center.  Wear comfortable clothing and clothing appropriate for easy access to any Portacath or PICC line.   We strive to give you quality time with your provider. You may need to reschedule your appointment if you arrive late (15 or more minutes).  Arriving late affects you and other patients whose appointments are after yours.  Also, if you miss three or more appointments without notifying the office, you may be dismissed from the clinic at the provider's discretion.      For prescription refill requests, have your pharmacy contact our office and allow 72 hours for refills to be completed.    Today you received the following chemotherapy and/or immunotherapy agents Velcade, return as scheduled.   To help prevent nausea and vomiting after your treatment, we encourage you to take your nausea medication as directed.  BELOW ARE SYMPTOMS THAT SHOULD BE REPORTED IMMEDIATELY: *FEVER GREATER THAN 100.4 F (38 C) OR HIGHER *CHILLS OR SWEATING *NAUSEA AND VOMITING THAT IS NOT CONTROLLED WITH YOUR NAUSEA MEDICATION *UNUSUAL SHORTNESS OF BREATH *UNUSUAL BRUISING OR BLEEDING *URINARY PROBLEMS (pain or burning when urinating, or frequent urination) *BOWEL PROBLEMS (unusual diarrhea, constipation, pain near the anus) TENDERNESS IN MOUTH AND THROAT WITH OR WITHOUT PRESENCE OF ULCERS (sore throat, sores in mouth, or a toothache) UNUSUAL RASH, SWELLING OR PAIN  UNUSUAL VAGINAL DISCHARGE OR ITCHING   Items with * indicate a potential emergency and  should be followed up as soon as possible or go to the Emergency Department if any problems should occur.  Please show the CHEMOTHERAPY ALERT CARD or IMMUNOTHERAPY ALERT CARD at check-in to the Emergency Department and triage nurse.  Should you have questions after your visit or need to cancel or reschedule your appointment, please contact [facility] [place] - A DEPT OF Eligha Bridegroom [facility] [phone]  and follow the prompts.  Office hours are 8:00 a.m. to 4:30 p.m. Monday - Friday. Please note that voicemails left after 4:00 p.m. may not be returned until the following business day.  We are closed weekends and major holidays. You have access to a nurse at all times for urgent questions. Please call the main number to the clinic [phone] and follow the prompts.  For any non-urgent questions, you may also contact your provider using MyChart. We now offer e-Visits for anyone 53 and older to request care online for non-urgent symptoms. For details visit [email or web address].   Also download the MyChart app! Go to the app store, search "MyChart", open the app, select [place], and log in with your MyChart username and password.

## 2023-02-16 NOTE — Progress Notes (Signed)
Patient tolerated Velcade injection with no complaints voiced. Lab work reviewed. See MAR for details. Injection site clean and dry with no bruising or swelling noted. Patient stable during and after injection. Band aid applied. VSS. Patient left in satisfactory condition with no s/s of distress noted. 

## 2023-02-17 DIAGNOSIS — N186 End stage renal disease: Secondary | ICD-10-CM | POA: Diagnosis not present

## 2023-02-17 DIAGNOSIS — Z992 Dependence on renal dialysis: Secondary | ICD-10-CM | POA: Diagnosis not present

## 2023-02-19 DIAGNOSIS — Z992 Dependence on renal dialysis: Secondary | ICD-10-CM | POA: Diagnosis not present

## 2023-02-19 DIAGNOSIS — N186 End stage renal disease: Secondary | ICD-10-CM | POA: Diagnosis not present

## 2023-02-22 ENCOUNTER — Non-Acute Institutional Stay (SKILLED_NURSING_FACILITY): Payer: Self-pay | Admitting: Adult Health

## 2023-02-22 ENCOUNTER — Encounter: Payer: Self-pay | Admitting: Adult Health

## 2023-02-22 DIAGNOSIS — N186 End stage renal disease: Secondary | ICD-10-CM

## 2023-02-22 DIAGNOSIS — M810 Age-related osteoporosis without current pathological fracture: Secondary | ICD-10-CM | POA: Diagnosis not present

## 2023-02-22 DIAGNOSIS — Z992 Dependence on renal dialysis: Secondary | ICD-10-CM | POA: Diagnosis not present

## 2023-02-22 DIAGNOSIS — E1122 Type 2 diabetes mellitus with diabetic chronic kidney disease: Secondary | ICD-10-CM | POA: Diagnosis not present

## 2023-02-22 DIAGNOSIS — D631 Anemia in chronic kidney disease: Secondary | ICD-10-CM | POA: Diagnosis not present

## 2023-02-22 DIAGNOSIS — E119 Type 2 diabetes mellitus without complications: Secondary | ICD-10-CM | POA: Diagnosis not present

## 2023-02-22 DIAGNOSIS — H26491 Other secondary cataract, right eye: Secondary | ICD-10-CM | POA: Diagnosis not present

## 2023-02-22 DIAGNOSIS — I120 Hypertensive chronic kidney disease with stage 5 chronic kidney disease or end stage renal disease: Secondary | ICD-10-CM | POA: Diagnosis not present

## 2023-02-22 DIAGNOSIS — Z961 Presence of intraocular lens: Secondary | ICD-10-CM | POA: Diagnosis not present

## 2023-02-22 DIAGNOSIS — H31001 Unspecified chorioretinal scars, right eye: Secondary | ICD-10-CM | POA: Diagnosis not present

## 2023-02-22 NOTE — Progress Notes (Signed)
Location:  [facility] Room Number: 146 Place of Service:  SNF (31)   CODE STATUS: dnr  Allergies  Allergen Reactions   Ace Inhibitors Other (See Comments) and Cough    Tongue swelling Angioedema    Angiotensin Receptor Blockers Other (See Comments)    Angioedema with ACE-I   Penicillins Other (See Comments)    Unknown reaction    Chief Complaint  Patient presents with   Medical Management of Chronic Issues         Diabetes mellitus type 2 with end stage renal disease (ESRD)  Anemia due to end stage renal disease;  Post menopausal osteoporosis:     HPI:  She is a 87 year old long term resident of this facility being seen for the management of her chronic illnesses: Diabetes mellitus type 2 with end stage renal disease (ESRD)  Anemia due to end stage renal disease;  Post menopausal osteoporosis.   There are no reports of uncontrolled pain. She continues with dialysis three days per week. There are no reports of depressive thoughts; her weight is stable.   Past Medical History:  Diagnosis Date   Anemia     chronic macrocytic anemia   Anxiety    Chronic kidney disease    Chronic renal disease, stage 4, severely decreased glomerular filtration rate (GFR) between 15-29 mL/min/1.73 square meter (HCC) 08/22/2015   Complication of anesthesia    delirious after Breast Surgery   Dementia (HCC)    mild   Depression    Diabetes mellitus with ESRD (end-stage renal disease) (HCC)    type II   Dysphagia    Dyspnea    with activity   GERD (gastroesophageal reflux disease)    Glaucoma    Hyperlipidemia    Hypertension    Multiple myeloma (HCC)    Pneumonia    Stage 1 infiltrating ductal carcinoma of right female breast (HCC) 08/21/2015   ER+ PR+ HER 2 neu + (3+) T1cN0     Past Surgical History:  Procedure Laterality Date   A/V FISTULAGRAM Right 07/05/2020   Procedure: A/V Fistulagram;  Surgeon: Sherren Kerns, MD;  Location: [facility];   Service: Cardiovascular;  Laterality: Right;   AV FISTULA PLACEMENT Left 11/22/2017   Procedure: ARTERIOVENOUS (AV) FISTULA CREATION LEFT ARM;  Surgeon: Sherren Kerns, MD;  Location: [facility] OR;  Service: Vascular;  Laterality: Left;   AV FISTULA PLACEMENT Right 04/04/2020   Procedure: RIGHT ARM ARTERIOVENOUS FISTULA CREATION;  Surgeon: Larina Earthly, MD;  Location: [facility];  Service: Vascular;  Laterality: Right;   AV FISTULA PLACEMENT Right 08/20/2020   Procedure: ARTERIOVENOUS (AV) FISTULA LIGATION RIGHT ARM;  Surgeon: Sherren Kerns, MD;  Location: [facility] OR;  Service: Vascular;  Laterality: Right;   BIOPSY  08/07/2016   Procedure: BIOPSY;  Surgeon: Corbin Ade, MD;  Location: [facility];  Service: Endoscopy;;  gastric ulcer biopsy   COLONOSCOPY     ESOPHAGOGASTRODUODENOSCOPY N/A 08/07/2016   LA Grade A esophagitis s/p dilation, small hiatal hernia, multiple gastric ulcers and erosions, duodenal erosions s/p biopsy. Negative H.pylori    ESOPHAGOGASTRODUODENOSCOPY N/A 11/27/2016   normal esophagus, previously noted gastric ulcers completely healed, normal duodenum.    ESOPHAGOGASTRODUODENOSCOPY (EGD) WITH PROPOFOL N/A 07/23/2020   Procedure: ESOPHAGOGASTRODUODENOSCOPY (EGD) WITH PROPOFOL;  Surgeon: Malissa Hippo, MD;  [facility];  Service: Endoscopy;  Laterality: N/A;   FISTULA SUPERFICIALIZATION Left 02/14/2018   Procedure: FISTULA SUPERFICIALIZATION LEFT  ARM;  Surgeon: Chuck Hint, MD;  Location: [facility] OR;  Service: Vascular;  Laterality: Left;   FRACTURE SURGERY Right    ankle   HOT HEMOSTASIS  07/23/2020   Procedure: HOT HEMOSTASIS (ARGON PLASMA COAGULATION/BICAP);  Surgeon: Malissa Hippo, MD;  [facility];  Service: Endoscopy;;   INTRAMEDULLARY (IM) NAIL INTERTROCHANTERIC Right 07/12/2020   Procedure: INTRAMEDULLARY (IM) NAIL INTERTROCHANTRIC;  Surgeon: Oliver Barre, MD;  Location: [facility];  Service: Orthopedics;  Laterality: Right;   IR  FLUORO GUIDE CV LINE LEFT  12/21/2022   IR US GUIDE VASC ACCESS LEFT  12/21/2022   MALONEY DILATION N/A 08/07/2016   Procedure: Elease Hashimoto DILATION;  Surgeon: Corbin Ade, MD;  [facility];  Service: Endoscopy;  Laterality: N/A;   MASTECTOMY, PARTIAL Right    PERIPHERAL VASCULAR BALLOON ANGIOPLASTY Left 07/13/2019   Procedure: PERIPHERAL VASCULAR BALLOON ANGIOPLASTY;  Surgeon: Cephus Shelling, MD;  [facility]: [facility];  Service: Cardiovascular;  Laterality: Left;  arm fistulogram   PERIPHERAL VASCULAR BALLOON ANGIOPLASTY Right 05/22/2020   Procedure: PERIPHERAL VASCULAR BALLOON ANGIOPLASTY;  Surgeon: Leonie Douglas, MD;  Location: [facility];  Service: Cardiovascular;  Laterality: Right;  arm fistula   PERIPHERAL VASCULAR BALLOON ANGIOPLASTY Right 07/05/2020   Procedure: PERIPHERAL VASCULAR BALLOON ANGIOPLASTY;  Surgeon: Sherren Kerns, MD;  Location: [facility];  Service: Cardiovascular;  Laterality: Right;  arm fistula   PORT-A-CATH REMOVAL Left 11/22/2017   Procedure: REMOVAL PORT-A-CATH LEFT CHEST;  Surgeon: Sherren Kerns, MD;  Location: [facility] OR;  Service: Vascular;  Laterality: Left;   RETINAL DETACHMENT SURGERY Right    SCLEROTHERAPY  07/23/2020   Procedure: SCLEROTHERAPY;  Surgeon: Malissa Hippo, MD;  [facility];  Service: Endoscopy;;   UPPER EXTREMITY VENOGRAPHY N/A 08/22/2020   Procedure: LEFT UPPER & CENTRAL VENOGRAPHY;  Surgeon: Cephus Shelling, MD;  Location: [facility];  Service: Cardiovascular;  Laterality: N/A;    Social History   Socioeconomic History   Marital status: Single    Spouse name: Not on file   Number of children: Not on file   Years of education: Not on file   Highest education level: Not on file  Occupational History   Occupation: retired   Tobacco Use   Smoking status: Never   Smokeless tobacco: Never  Vaping Use   Vaping status: Never Used  Substance and Sexual Activity    Alcohol use: No    Alcohol/week: 0.0 standard drinks of alcohol   Drug use: No   Sexual activity: Never  Other Topics Concern   Not on file  Social History Narrative   Long term resident of SNF    Social Drivers of Health   Financial Resource Strain: Low Risk  (01/09/2020)   Overall Financial Resource Strain (CARDIA)    Difficulty of Paying Living Expenses: Not very hard  Food Insecurity: No Food Insecurity (12/16/2022)   Hunger Vital Sign    Worried About Running Out of Food in the Last Year: Never true    Ran Out of Food in the Last Year: Never true  Transportation Needs: No Transportation Needs (12/16/2022)   PRAPARE - Administrator, Civil Service (Medical): No    Lack of Transportation (Non-Medical): No  Physical Activity: Inactive (01/09/2020)   Exercise Vital Sign    Days of Exercise per Week: 0 days    Minutes of Exercise per Session: 0 min  Stress:  No Stress Concern Present (01/09/2020)   [employer] of Occupational Health - Occupational Stress Questionnaire    Feeling of Stress : Not at all  Social Connections: Moderately Isolated (01/09/2020)   Social Connection and Isolation Panel [NHANES]    Frequency of Communication with Friends and Family: More than three times a week    Frequency of Social Gatherings with Friends and Family: Once a week    Attends Religious Services: More than 4 times per year    Active Member of [employer] or Organizations: No    Attends Banker Meetings: Never    Marital Status: Never married  Intimate Partner Violence: Not At Risk (12/16/2022)   Humiliation, Afraid, Rape, and Kick questionnaire    Fear of Current or Ex-Partner: No    Emotionally Abused: No    Physically Abused: No    Sexually Abused: No   Family History  Problem Relation Age of Onset   Multiple myeloma Sister    Brain cancer Sister    Dementia Mother        died at 22   Stroke Mother    Heart failure Mother    Diabetes Mother    Heart disease  Father    Prostate cancer Brother    Colon cancer Neg Hx       VITAL SIGNS BP (!) 142/73   Pulse 88   Temp (!) 96.9 F (36.1 C)   Resp 20   Ht 4\' 9"  (1.448 m)   Wt 139 lb 9.6 oz (63.3 kg)   SpO2 96%   BMI 30.21 kg/m   Outpatient Encounter Medications as of 02/22/2023  Medication Sig   acetaminophen (TYLENOL) 325 MG tablet Take 650 mg by mouth 3 (three) times daily.   acyclovir (ZOVIRAX) 200 MG capsule Take 200 mg by mouth in the morning. (0800)   anastrozole (ARIMIDEX) 1 MG tablet TAKE 1 TABLET BY MOUTH DAILY   Calcium Carb-Cholecalciferol (CALCIUM + VITAMIN D3) 500-5 MG-MCG TABS Take 1 tablet by mouth daily.   melatonin 5 MG TABS Take 5 mg by mouth at bedtime.   multivitamin (RENA-VIT) TABS tablet Take 1 tablet by mouth at bedtime.   Nutritional Supplements (ENSURE ENLIVE PO) Take 237 mLs by mouth every evening.   ondansetron (ZOFRAN-ODT) 4 MG disintegrating tablet Take 4 mg by mouth daily before breakfast.   pantoprazole (PROTONIX) 40 MG tablet Take 40 mg by mouth 2 (two) times daily.   sennosides-docusate sodium (SENOKOT-S) 8.6-50 MG tablet Take 1 tablet by mouth 2 (two) times daily.   sertraline (ZOLOFT) 25 MG tablet Take 25 mg by mouth daily.   sevelamer carbonate (RENVELA) 800 MG tablet Take by mouth.   sucralfate (CARAFATE) 1 GM/10ML suspension Take 1 g by mouth in the morning, at noon, in the evening, and at bedtime.   Facility-Administered Encounter Medications as of 02/22/2023  Medication   lanreotide acetate (SOMATULINE DEPOT) 120 MG/0.5ML injection   lanreotide acetate (SOMATULINE DEPOT) 120 MG/0.5ML injection   lanreotide acetate (SOMATULINE DEPOT) 120 MG/0.5ML injection   lanreotide acetate (SOMATULINE DEPOT) 120 MG/0.5ML injection   octreotide (SANDOSTATIN LAR) 30 MG IM injection   octreotide (SANDOSTATIN LAR) 30 MG IM injection     SIGNIFICANT DIAGNOSTIC EXAMS   PREVIOUS  10-24-21: DEXA: t score -2.957  NO NEW EXAMS    LABS REVIEWED PREVIOUS      02-17-22: wbc 4.6; hgb 11.5; hct 36.5; mcv 93.6 plt 207; glucose 95; bun 23 creat 7.44 k+ 4.1; na++ 135; ca  7.8; gfr 5 protein 6.9; albumin 3.8  03-31-22: wbc 4.7; hgb 10.0; hct 32.2; mcv 96.4 plt 278; glucose 155; bun 28; creat 6.50; k+ 4.0; na++ 132; ca 8.0; gfr 6 protein 6.9 albumin 3.6 05-19-22: wbc 4.1; hgb 9.6; hct 30.9; mcv 98.7 plt 236; glucose 170; bun 33; creat 6.03; k+ 3.9; na++ 137; ca 8.1; gfr 6; protein 6.6 albumin 3.4; mag 2.1  06-23-22: wbc 4.9; hgb 9.1; hct 28.7 mcv 101.4 plt 286; glucose 158; bun 25; creat 5.85; k+ 3.9 na++ 136; ca 8.6 gfr 7 protein 6.8 albumin 3.3 mag 2.2 07-02-22: hgb A1c 5.1 07-07-22: wbc 4.9; hgb 10.3; hct 32.8; mcv 102.5 plt 265; glucose 141; bun 32; creat 6.15; k+ 4.3; na++ 136; ca 8.9; gfr 6 protein 7.2 albumin 3.7; mag 2.3 07-20-22: vitamin B 12: 660; tsh 1.836 08-04-22: wbc 4.6; hgb 10.7; ht 33.1; mcv 100.3 plt 234; glucose 120; bun 41; creat 6.46; k+ 4.2; na++ 135; ca 9.8; gfr 6; protein 7.3; albumin 3.7 mag 2.4 09-15-22: wbc 5.2; hgb 9.9; hct 31.0; mcv 103.3 plt 224; glucose 132; bun 22; creat 6.19; k+ 4.2; na++ 135; ca 9.5 gfr 6 protein 6.9 albumin 3.7     11-10-22: wbc 4.0; hgb 10.7; hct 33.4; mcv 102.1 plt 187; glucose 110; bun 28; creat 6.16; k+ 4.8; na++ 130; ca 9.0; gfr 6; protein 6.8 albumin 3.6 mag 2.4  12-16-22: wbc 6.7; hgb 12.4; hct 38.3; mcv 101.3 plt 201; glucose 123; bun 22; creat 4.81; k+ 4.1; na++ 133; ca 9.4; gfr 8; protein 7.9; albumin 4.1; blood culture: staphylococcus hominis  12-22-22: wbc 4.2; hgb 9.8; hct 30.1; mcv 100.0; plt 184; glucose 90; bun 81; creat 12.02; k+ 5.0; na++ 136; ca 8.8; gfr 3; phos 10.8; albumin 3.2  TODAY  02-16-23: wbc 4.9; hgb 9.2; hct 28.8; mcv 104.7 plt 215 glucose 194; bun 32; creat 8.17; k+ 5.1; na++ 9.8;gfr 4; protein 7.5 albumin 3.9 mag 2.6    Review of Systems  Constitutional:  Negative for malaise/fatigue.  Respiratory:  Negative for cough and shortness of breath.   Cardiovascular:  Negative for chest pain,  palpitations and leg swelling.  Gastrointestinal:  Negative for abdominal pain, constipation and heartburn.  Musculoskeletal:  Negative for back pain, joint pain and myalgias.  Skin: Negative.   Neurological:  Negative for dizziness.  Psychiatric/Behavioral:  The patient is not nervous/anxious.    Physical Exam Constitutional:      General: She is not in acute distress.    Appearance: She is well-developed. She is not diaphoretic.  Neck:     Thyroid: No thyromegaly.  Cardiovascular:     Rate and Rhythm: Normal rate and regular rhythm.     Pulses: Normal pulses.     Heart sounds: Normal heart sounds.  Pulmonary:     Effort: Pulmonary effort is normal. No respiratory distress.     Breath sounds: Normal breath sounds.  Abdominal:     General: Bowel sounds are normal. There is no distension.     Palpations: Abdomen is soft.     Tenderness: There is no abdominal tenderness.  Musculoskeletal:        General: Normal range of motion.     Cervical back: Neck supple.     Right lower leg: No edema.     Left lower leg: No edema.  Lymphadenopathy:     Cervical: No cervical adenopathy.  Skin:    General: Skin is warm and dry.     Comments: Tunneled dialysis access  Neurological:     Mental Status: She is alert. Mental status is at baseline.  Psychiatric:        Mood and Affect: Mood normal.         ASSESSMENT/PLAN  TODAY  Diabetes mellitus type 2 with end stage renal disease (ESRD) hgb A1c 5.1 is currently off medications  3. Anemia due to end stage renal disease; hgb 9.2  4. Post menopausal osteoporosis: t score -2.957 will continue supplements   PREVIOUS   5. Stage 1 infiltrating ductal carcinoma of female right breast/status post right mastectomy will continue arimidex 1 mg daily  6. Multiple myeloma not having achieved remission: is followed by oncology    7. Herpes: without outbreak will continue acyclovir 200 mg daily   8. Major depression chronic recurrent:  will continue zoloft 50 mg daily  9. Protein calorie malnutrition: protein 7.5 albumin 3.9 will continue supplements and prostat with meals.   10. Multiple gastric ulcers/duodenal ulcer/upper GI bleed/esophageal reflux disease without esophagitis: will continue protonix 40 mg twice daily carafate 1 gm with before meals and bedtime. Zofran 4 mg daily and every 6 hours as needed    11. Generalized chronic pain: will continue tylenol 650 mg three times daily   12. Chronic kidney disease with end stage renal disease on dialysis due to type 2 diabetes mellitus/dependence on dialysis: will continue dialysis three days per week: 1200 cc fluid restriction;   13. Hypertension associated with end stage renal disease (ESRD) due to type 2 diabetes mellitus: b/p 142/73 will monitor   14. Hyperlipidemia associated with type 2 diabetes mellitus: ldl 89; is off lipitor; had elevated liver enzymes.   15. Aortic atherosclerosis/atherosclerotic peripheral vascular disease (ct 10-04-18) ldl 89  16. Vascular dementia with behavioral disturbance: weight is 139 pounds and stable.       Synthia Innocent NP [facility]   call [phone]

## 2023-02-24 DIAGNOSIS — N186 End stage renal disease: Secondary | ICD-10-CM | POA: Diagnosis not present

## 2023-02-24 DIAGNOSIS — Z992 Dependence on renal dialysis: Secondary | ICD-10-CM | POA: Diagnosis not present

## 2023-02-26 DIAGNOSIS — N186 End stage renal disease: Secondary | ICD-10-CM | POA: Diagnosis not present

## 2023-02-26 DIAGNOSIS — Z992 Dependence on renal dialysis: Secondary | ICD-10-CM | POA: Diagnosis not present

## 2023-03-01 ENCOUNTER — Inpatient Hospital Stay: Payer: Medicare PPO

## 2023-03-01 ENCOUNTER — Inpatient Hospital Stay: Payer: Medicare PPO | Admitting: Hematology

## 2023-03-01 DIAGNOSIS — N186 End stage renal disease: Secondary | ICD-10-CM | POA: Diagnosis not present

## 2023-03-01 DIAGNOSIS — Z992 Dependence on renal dialysis: Secondary | ICD-10-CM | POA: Diagnosis not present

## 2023-03-01 NOTE — Progress Notes (Signed)
[facility] [address], [place] [address]    Clinic Day:  03/02/23   Referring physician: Sharee Holster, NP  Patient Care Team: Sharee Holster, NP as PCP - General (Geriatric Medicine) [name], Gerrit Friends, MD as Consulting Physician (Gastroenterology) [name], MD as Medical Oncologist (Medical Oncology) Salomon Mast, MD (Inactive) as Consulting Physician (Nephrology) [facility]   ASSESSMENT & PLAN:   Assessment: 1.  IgA lambda plasma cell myeloma, stage II, standard risk: -BMBX on 09/20/2018 shows plasma cell myeloma, 30% plasma cells.  FISH panel was normal.  Chromosome analysis normal. -Labs at diagnosis on 08/29/2018 with M spike 0.9 g.  Kappa light chains 148, lambda light chains 1132, ratio 0.13.  LDH normal.  Beta-2 microglobulin 13.5.  She had transfusion dependent anemia. -Velcade and dexamethasone started on 10/11/2018. -Myeloma panel on 07/11/2019 shows SPEP is negative.  Free light chain ratio improved to 1.09.  Lambda light chains are 128, improved from 141.  Kappa light chains are 140. -She has been resident at [facility] since 10/12/2019.   2.  Stage I IDC of the right breast, ER/PR positive, HER-2 negative: -She is on anastrozole.   Plan: 1.  IgA lambda plasma cell myeloma: - She is tolerating Velcade every other week with dexamethasone 10 mg on days of Velcade reasonably well. - She does not report any neuropathy.  At last visit she told some tingling in the fingertips and toes at nighttime which has resolved. - Reviewed labs today: Normal LFTs.  CBC shows normal white count and platelet count. - Reviewed myeloma labs from 01/05/2023: M spike has been consistently elevated at 0.3 g from July.  Lambda light chains are 523 with a normal ratio of 0.36.  Lambda light chains have been steadily increased.  Immunofixation shows IgA lambda. - We talked about slow progression of disease.  We talked  about aggressive treatment options.  However because of her overall health, we will continue Velcade every 2 weeks at this time.  We will repeat another myeloma panel in 2 weeks and meet again in 4 weeks.  If there is worsening, we will consider intensifying Velcade or adding Revlimid 5 mg daily.   2.  ESRD on HD: - Continue HD on Monday, Wednesday and Friday.   3.  Bone strengthening: - Bisphosphonates not started as she was on dialysis.   4.  Right breast cancer: - Continue anastrozole daily.  No side effects from it.  Orders Placed This Encounter  Procedures   Magnesium    Standing Status:   Future    Expected Date:   03/30/2023    Expiration Date:   03/29/2024   CBC with Differential    Standing Status:   Future    Expected Date:   03/30/2023    Expiration Date:   03/29/2024   Comprehensive metabolic panel    Standing Status:   Future    Expected Date:   03/30/2023    Expiration Date:   03/29/2024   Magnesium    Standing Status:   Future    Expected Date:   04/13/2023    Expiration Date:   04/12/2024   CBC with Differential    Standing Status:   Future    Expected Date:   04/13/2023    Expiration Date:   04/12/2024   Comprehensive metabolic panel    Standing Status:   Future    Expected Date:   04/13/2023  Expiration Date:   04/12/2024   Magnesium    Standing Status:   Future    Expected Date:   04/27/2023    Expiration Date:   04/26/2024   CBC with Differential    Standing Status:   Future    Expected Date:   04/27/2023    Expiration Date:   04/26/2024   Comprehensive metabolic panel    Standing Status:   Future    Expected Date:   04/27/2023    Expiration Date:   04/26/2024       Mikeal Hawthorne R Teague,acting as a scribe for [name], MD.,have documented all relevant documentation on the behalf of [name], MD,as directed by  [name], MD while in the presence of [name], MD.  I, [name] MD, have reviewed the above  documentation for accuracy and completeness, and I agree with the above.      [name], MD   12/24/202412:46 PM  CHIEF COMPLAINT:   Diagnosis: plasma cell myeloma and right breast cancer    Cancer Staging  Stage 1 infiltrating ductal carcinoma of right female breast [facility]) Staging form: Breast, AJCC 7th Edition - Clinical stage from 08/30/2015: Stage IA (T1c, N0, M0) - Signed by Ellouise Newer, PA-C on 08/30/2015    Prior Therapy: none  Current Therapy:  maintenance Velcade & Decadron 3/4 weeks; anastrozole   HISTORY OF PRESENT ILLNESS:   Oncology History  Stage 1 infiltrating ductal carcinoma of right female breast (HCC)  09/12/2014 Mammogram   Mass in upper outer R breast, middle third depth appears slightly larger than it was on prior exam with more irreg spiculated margins   10/02/2014 Pathology Results   biopsy with invasive ductal carcinoma high grade 1.1 cm, dcis solid type. additional R breast tissue, excision, invasive ductal high grade 0.9 cm    10/24/2014 Pathology Results   no residual invasive carcinoma, DCIS, focal, atypical ductal hyperplasia, 0/7 LN positive for metastatic carcinoma ER > 90%, PR 30%, HER 2 2+   10/24/2014 Cancer Staging   T1cN0M0   10/24/2014 Surgery   R mastectomy, T1c, N0   12/28/2014 - 11/22/2015 Chemotherapy   Taxol/herceptin weekly X 12, Herceptin every 21 days, last due in September 2017   03/11/2015 -  Anti-estrogen oral therapy   Arimidex 1 mg daily   09/12/2015 Imaging   MUGA- The left ventricular ejection fraction equals 68%.   06/08/2016 Treatment Plan Change   Started Nerlynx   Multiple myeloma (HCC)  10/04/2018 Initial Diagnosis   Multiple myeloma (HCC)   10/11/2018 - 10/28/2021 Chemotherapy   Patient is on Treatment Plan : MYELOMA NON-TRANSPLANT CANDIDATES VRd weekly d21d      11/25/2021 -  Chemotherapy   Patient is on Treatment Plan : MYELOMA MAINTENANCE Bortezomib SQ q14d        INTERVAL HISTORY:   Carly Wood is  a 87 y.o. female presenting to clinic today for follow up of plasma cell myeloma and breast cancer. She was last seen by me on 01/05/23.  Today, she states that she is doing well overall. Her appetite level is at 100%. Her energy level is at 70%. She denies any tingling, numbness, pain, or cold sensations in the fingertips or toes. She is tolerating Velcade injections well.   PAST MEDICAL HISTORY:   Past Medical History: Past Medical History:  Diagnosis Date   Anemia     chronic macrocytic anemia   Anxiety    Chronic kidney disease    Chronic renal  disease, stage 4, severely decreased glomerular filtration rate (GFR) between 15-29 mL/min/1.73 square meter (HCC) 08/22/2015   Complication of anesthesia    delirious after Breast Surgery   Dementia (HCC)    mild   Depression    Diabetes mellitus with ESRD (end-stage renal disease) (HCC)    type II   Dysphagia    Dyspnea    with activity   GERD (gastroesophageal reflux disease)    Glaucoma    Hyperlipidemia    Hypertension    Multiple myeloma (HCC)    Pneumonia    Stage 1 infiltrating ductal carcinoma of right female breast (HCC) 08/21/2015   ER+ PR+ HER 2 neu + (3+) T1cN0     Surgical History: Past Surgical History:  Procedure Laterality Date   A/V FISTULAGRAM Right 07/05/2020   Procedure: A/V Fistulagram;  Surgeon: Sherren Kerns, MD;  [facility];  Service: Cardiovascular;  Laterality: Right;   AV FISTULA PLACEMENT Left 11/22/2017   Procedure: ARTERIOVENOUS (AV) FISTULA CREATION LEFT ARM;  Surgeon: Sherren Kerns, MD;  Location: [facility] OR;  Service: Vascular;  Laterality: Left;   AV FISTULA PLACEMENT Right 04/04/2020   Procedure: RIGHT ARM ARTERIOVENOUS FISTULA CREATION;  Surgeon: Larina Earthly, MD;  Location: [facility];  Service: Vascular;  Laterality: Right;   AV FISTULA PLACEMENT Right 08/20/2020   Procedure: ARTERIOVENOUS (AV) FISTULA LIGATION RIGHT ARM;  Surgeon: Sherren Kerns, MD;  Location: [facility] OR;   Service: Vascular;  Laterality: Right;   BIOPSY  08/07/2016   Procedure: BIOPSY;  Surgeon: Corbin Ade, MD;  [facility];  Service: Endoscopy;;  gastric ulcer biopsy   COLONOSCOPY     ESOPHAGOGASTRODUODENOSCOPY N/A 08/07/2016   LA Grade A esophagitis s/p dilation, small hiatal hernia, multiple gastric ulcers and erosions, duodenal erosions s/p biopsy. Negative H.pylori    ESOPHAGOGASTRODUODENOSCOPY N/A 11/27/2016   normal esophagus, previously noted gastric ulcers completely healed, normal duodenum.    ESOPHAGOGASTRODUODENOSCOPY (EGD) WITH PROPOFOL N/A 07/23/2020   Procedure: ESOPHAGOGASTRODUODENOSCOPY (EGD) WITH PROPOFOL;  Surgeon: Malissa Hippo, MD;  [facility];  Service: Endoscopy;  Laterality: N/A;   FISTULA SUPERFICIALIZATION Left 02/14/2018   Procedure: FISTULA SUPERFICIALIZATION LEFT ARM;  Surgeon: Chuck Hint, MD;  Location: [facility] OR;  Service: Vascular;  Laterality: Left;   FRACTURE SURGERY Right    ankle   HOT HEMOSTASIS  07/23/2020   Procedure: HOT HEMOSTASIS (ARGON PLASMA COAGULATION/BICAP);  Surgeon: Malissa Hippo, MD;  [facility];  Service: Endoscopy;;   INTRAMEDULLARY (IM) NAIL INTERTROCHANTERIC Right 07/12/2020   Procedure: INTRAMEDULLARY (IM) NAIL INTERTROCHANTRIC;  Surgeon: Oliver Barre, MD;  Location: [facility];  Service: Orthopedics;  Laterality: Right;   IR FLUORO GUIDE CV LINE LEFT  12/21/2022   IR US GUIDE VASC ACCESS LEFT  12/21/2022   MALONEY DILATION N/A 08/07/2016   Procedure: Elease Hashimoto DILATION;  Surgeon: Corbin Ade, MD;  [facility];  Service: Endoscopy;  Laterality: N/A;   MASTECTOMY, PARTIAL Right    PERIPHERAL VASCULAR BALLOON ANGIOPLASTY Left 07/13/2019   Procedure: PERIPHERAL VASCULAR BALLOON ANGIOPLASTY;  Surgeon: Cephus Shelling, MD;  [facility];  Service: Cardiovascular;  Laterality: Left;  arm fistulogram   PERIPHERAL VASCULAR BALLOON ANGIOPLASTY Right 05/22/2020    Procedure: PERIPHERAL VASCULAR BALLOON ANGIOPLASTY;  Surgeon: Leonie Douglas, MD;  [facility];  Service: Cardiovascular;  Laterality: Right;  arm fistula   PERIPHERAL VASCULAR BALLOON ANGIOPLASTY Right 07/05/2020  Procedure: PERIPHERAL VASCULAR BALLOON ANGIOPLASTY;  Surgeon: Sherren Kerns, MD;  [facility];  Service: Cardiovascular;  Laterality: Right;  arm fistula   PORT-A-CATH REMOVAL Left 11/22/2017   Procedure: REMOVAL PORT-A-CATH LEFT CHEST;  Surgeon: Sherren Kerns, MD;  Location: [facility] OR;  Service: Vascular;  Laterality: Left;   RETINAL DETACHMENT SURGERY Right    SCLEROTHERAPY  07/23/2020   Procedure: SCLEROTHERAPY;  Surgeon: Malissa Hippo, MD;  [facility];  Service: Endoscopy;;   UPPER EXTREMITY VENOGRAPHY N/A 08/22/2020   Procedure: LEFT UPPER & CENTRAL VENOGRAPHY;  Surgeon: Cephus Shelling, MD;  [facility];  Service: Cardiovascular;  Laterality: N/A;    Social History: Social History   Socioeconomic History   Marital status: Single    Spouse name: Not on file   Number of children: Not on file   Years of education: Not on file   Highest education level: Not on file  Occupational History   Occupation: retired   Tobacco Use   Smoking status: Never   Smokeless tobacco: Never  Vaping Use   Vaping status: Never Used  Substance and Sexual Activity   Alcohol use: No    Alcohol/week: 0.0 standard drinks of alcohol   Drug use: No   Sexual activity: Never  Other Topics Concern   Not on file  Social History Narrative   Long term resident of SNF    Social Drivers of Health   Financial Resource Strain: Low Risk  (01/09/2020)   Overall Financial Resource Strain (CARDIA)    Difficulty of Paying Living Expenses: Not very hard  Food Insecurity: No Food Insecurity (12/16/2022)   Hunger Vital Sign    Worried About Running Out of Food in the Last Year: Never true    Ran Out of Food in the Last Year:  Never true  Transportation Needs: No Transportation Needs (12/16/2022)   PRAPARE - Administrator, Civil Service (Medical): No    Lack of Transportation (Non-Medical): No  Physical Activity: Inactive (01/09/2020)   Exercise Vital Sign    Days of Exercise per Week: 0 days    Minutes of Exercise per Session: 0 min  Stress: No Stress Concern Present (01/09/2020)   [employer] of Occupational Health - Occupational Stress Questionnaire    Feeling of Stress : Not at all  Social Connections: Moderately Isolated (01/09/2020)   Social Connection and Isolation Panel [NHANES]    Frequency of Communication with Friends and Family: More than three times a week    Frequency of Social Gatherings with Friends and Family: Once a week    Attends Religious Services: More than 4 times per year    Active Member of [employer] or Organizations: No    Attends Banker Meetings: Never    Marital Status: Never married  Intimate Partner Violence: Not At Risk (12/16/2022)   Humiliation, Afraid, Rape, and Kick questionnaire    Fear of Current or Ex-Partner: No    Emotionally Abused: No    Physically Abused: No    Sexually Abused: No    Family History: Family History  Problem Relation Age of Onset   Multiple myeloma Sister    Brain cancer Sister    Dementia Mother        died at 74   Stroke Mother    Heart failure Mother    Diabetes Mother    Heart disease Father    Prostate cancer Brother  Colon cancer Neg Hx     Current Medications:  Current Outpatient Medications:    acetaminophen (TYLENOL) 325 MG tablet, Take 650 mg by mouth 3 (three) times daily., Disp: , Rfl:    acyclovir (ZOVIRAX) 200 MG capsule, Take 200 mg by mouth in the morning. (0800), Disp: , Rfl:    anastrozole (ARIMIDEX) 1 MG tablet, TAKE 1 TABLET BY MOUTH DAILY, Disp: 30 tablet, Rfl: 6   Calcium Carb-Cholecalciferol (CALCIUM + VITAMIN D3) 500-5 MG-MCG TABS, Take 1 tablet by mouth daily., Disp: , Rfl:     melatonin 5 MG TABS, Take 5 mg by mouth at bedtime., Disp: , Rfl:    multivitamin (RENA-VIT) TABS tablet, Take 1 tablet by mouth at bedtime., Disp: , Rfl:    Nutritional Supplements (ENSURE ENLIVE PO), Take 237 mLs by mouth every evening., Disp: , Rfl:    ondansetron (ZOFRAN-ODT) 4 MG disintegrating tablet, Take 4 mg by mouth daily before breakfast., Disp: , Rfl:    pantoprazole (PROTONIX) 40 MG tablet, Take 40 mg by mouth 2 (two) times daily., Disp: , Rfl:    sennosides-docusate sodium (SENOKOT-S) 8.6-50 MG tablet, Take 1 tablet by mouth 2 (two) times daily., Disp: , Rfl:    sertraline (ZOLOFT) 25 MG tablet, Take 25 mg by mouth daily., Disp: , Rfl:    sevelamer carbonate (RENVELA) 800 MG tablet, Take by mouth., Disp: , Rfl:    sucralfate (CARAFATE) 1 GM/10ML suspension, Take 1 g by mouth in the morning, at noon, in the evening, and at bedtime., Disp: , Rfl:  No current facility-administered medications for this visit.  Facility-Administered Medications Ordered in Other Visits:    lanreotide acetate (SOMATULINE DEPOT) 120 MG/0.5ML injection, , , ,    lanreotide acetate (SOMATULINE DEPOT) 120 MG/0.5ML injection, , , ,    lanreotide acetate (SOMATULINE DEPOT) 120 MG/0.5ML injection, , , ,    lanreotide acetate (SOMATULINE DEPOT) 120 MG/0.5ML injection, , , ,    octreotide (SANDOSTATIN LAR) 30 MG IM injection, , , ,    octreotide (SANDOSTATIN LAR) 30 MG IM injection, , , ,    Allergies: Allergies  Allergen Reactions   Ace Inhibitors Other (See Comments) and Cough    Tongue swelling Angioedema    Angiotensin Receptor Blockers Other (See Comments)    Angioedema with ACE-I   Penicillins Other (See Comments)    Unknown reaction    REVIEW OF SYSTEMS:   Review of Systems  Constitutional:  Negative for chills, fatigue and fever.  HENT:   Negative for lump/mass, mouth sores, nosebleeds, sore throat and trouble swallowing.   Eyes:  Negative for eye problems.  Respiratory:  Positive for cough  (occasional). Negative for shortness of breath.   Cardiovascular:  Negative for chest pain, leg swelling and palpitations.  Gastrointestinal:  Negative for abdominal pain, constipation, diarrhea, nausea and vomiting.  Genitourinary:  Negative for bladder incontinence, difficulty urinating, dysuria, frequency, hematuria and nocturia.   Musculoskeletal:  Negative for arthralgias, back pain, flank pain, myalgias and neck pain.  Skin:  Negative for itching and rash.  Neurological:  Negative for dizziness, headaches and numbness.  Hematological:  Does not bruise/bleed easily.  Psychiatric/Behavioral:  Negative for depression, sleep disturbance and suicidal ideas. The patient is not nervous/anxious.   All other systems reviewed and are negative.    VITALS:   Blood pressure (!) 154/74, pulse 73, temperature 97.8 F (36.6 C), temperature source Oral, resp. rate 18, SpO2 100%.  Wt Readings from Last 3 Encounters:  03/02/23  144 lb 14.4 oz (65.7 kg)  02/22/23 139 lb 9.6 oz (63.3 kg)  02/16/23 134 lb 3.2 oz (60.9 kg)    There is no height or weight on file to calculate BMI.  Performance status (ECOG): 1 - Symptomatic but completely ambulatory  PHYSICAL EXAM:   Physical Exam Vitals and nursing note reviewed. Exam conducted with a chaperone present.  Constitutional:      Appearance: Normal appearance.  Cardiovascular:     Rate and Rhythm: Normal rate and regular rhythm.     Pulses: Normal pulses.     Heart sounds: Normal heart sounds.  Pulmonary:     Effort: Pulmonary effort is normal.     Breath sounds: Normal breath sounds.  Abdominal:     Palpations: Abdomen is soft. There is no hepatomegaly, splenomegaly or mass.     Tenderness: There is no abdominal tenderness.  Musculoskeletal:     Right lower leg: No edema.     Left lower leg: No edema.  Lymphadenopathy:     Cervical: No cervical adenopathy.     Right cervical: No superficial, deep or posterior cervical adenopathy.    Left  cervical: No superficial, deep or posterior cervical adenopathy.     Upper Body:     Right upper body: No supraclavicular or axillary adenopathy.     Left upper body: No supraclavicular or axillary adenopathy.  Neurological:     General: No focal deficit present.     Mental Status: She is alert and oriented to person, place, and time.  Psychiatric:        Mood and Affect: Mood normal.        Behavior: Behavior normal.     LABS:      Latest Ref Rng & Units 03/02/2023    8:04 AM 02/16/2023   11:10 AM 02/02/2023   11:12 AM  CBC  WBC 4.0 - 10.5 K/uL 4.3  4.9  4.2   Hemoglobin 12.0 - 15.0 g/dL 9.6  9.2  9.4   Hematocrit 36.0 - 46.0 % 31.0  28.8  29.5   Platelets 150 - 400 K/uL 204  215  200       Latest Ref Rng & Units 03/02/2023    8:04 AM 02/16/2023   11:10 AM 02/02/2023   11:12 AM  CMP  Glucose 70 - 99 mg/dL 76  440  347   BUN 8 - 23 mg/dL 25  32  27   Creatinine 0.44 - 1.00 mg/dL 4.25  9.56  3.87   Sodium 135 - 145 mmol/L 138  136  135   Potassium 3.5 - 5.1 mmol/L 4.9  5.1  4.4   Chloride 98 - 111 mmol/L 97  98  95   CO2 22 - 32 mmol/L 26  22  28    Calcium 8.9 - 10.3 mg/dL 56.4  9.8  9.4   Total Protein 6.5 - 8.1 g/dL 7.1  7.5  7.3   Total Bilirubin <1.2 mg/dL 0.6  0.3  0.4   Alkaline Phos 38 - 126 U/L 98  94  81   AST 15 - 41 U/L 20  18  22    ALT 0 - 44 U/L 12  13  13       No results found for: "CEA1", "CEA" / No results found for: "CEA1", "CEA" No results found for: "PSA1" No results found for: "PPI951" No results found for: "CAN125"  Lab Results  Component Value Date   TOTALPROTELP 6.8 01/05/2023  TOTALPROTELP 7.0 01/05/2023   ALBUMINELP 3.9 01/05/2023   A1GS 0.3 01/05/2023   A2GS 0.8 01/05/2023   BETS 1.1 01/05/2023   GAMS 0.7 01/05/2023   MSPIKE 0.3 (H) 01/05/2023   SPEI Comment 01/05/2023   Lab Results  Component Value Date   TIBC 110 (L) 12/18/2022   TIBC 140 (L) 12/16/2021   TIBC 142 (L) 08/12/2021   FERRITIN 277 12/18/2022   FERRITIN 272  12/16/2021   FERRITIN 312 (H) 08/12/2021   IRONPCTSAT 36 (H) 12/18/2022   IRONPCTSAT 39 (H) 12/16/2021   IRONPCTSAT 65 (H) 08/12/2021   Lab Results  Component Value Date   LDH 120 10/21/2021   LDH 129 10/14/2021   LDH 136 10/07/2020     STUDIES:   No results found.

## 2023-03-02 ENCOUNTER — Inpatient Hospital Stay (HOSPITAL_BASED_OUTPATIENT_CLINIC_OR_DEPARTMENT_OTHER): Payer: Medicare PPO | Admitting: Hematology

## 2023-03-02 ENCOUNTER — Inpatient Hospital Stay: Payer: Medicare PPO

## 2023-03-02 ENCOUNTER — Other Ambulatory Visit: Payer: Medicare PPO

## 2023-03-02 VITALS — BP 154/74 | HR 73 | Temp 97.8°F | Resp 18

## 2023-03-02 VITALS — Wt 144.9 lb

## 2023-03-02 DIAGNOSIS — Z170 Estrogen receptor positive status [ER+]: Secondary | ICD-10-CM | POA: Diagnosis not present

## 2023-03-02 DIAGNOSIS — N186 End stage renal disease: Secondary | ICD-10-CM | POA: Diagnosis not present

## 2023-03-02 DIAGNOSIS — Z1732 Human epidermal growth factor receptor 2 negative status: Secondary | ICD-10-CM | POA: Diagnosis not present

## 2023-03-02 DIAGNOSIS — Z5112 Encounter for antineoplastic immunotherapy: Secondary | ICD-10-CM | POA: Diagnosis not present

## 2023-03-02 DIAGNOSIS — Z79811 Long term (current) use of aromatase inhibitors: Secondary | ICD-10-CM | POA: Diagnosis not present

## 2023-03-02 DIAGNOSIS — Z9011 Acquired absence of right breast and nipple: Secondary | ICD-10-CM | POA: Diagnosis not present

## 2023-03-02 DIAGNOSIS — C9000 Multiple myeloma not having achieved remission: Secondary | ICD-10-CM

## 2023-03-02 DIAGNOSIS — C50411 Malignant neoplasm of upper-outer quadrant of right female breast: Secondary | ICD-10-CM | POA: Diagnosis not present

## 2023-03-02 DIAGNOSIS — Z1721 Progesterone receptor positive status: Secondary | ICD-10-CM | POA: Diagnosis not present

## 2023-03-02 LAB — CBC WITH DIFFERENTIAL/PLATELET
Abs Immature Granulocytes: 0.02 10*3/uL (ref 0.00–0.07)
Basophils Absolute: 0 10*3/uL (ref 0.0–0.1)
Basophils Relative: 1 %
Eosinophils Absolute: 0.2 10*3/uL (ref 0.0–0.5)
Eosinophils Relative: 4 %
HCT: 31 % — ABNORMAL LOW (ref 36.0–46.0)
Hemoglobin: 9.6 g/dL — ABNORMAL LOW (ref 12.0–15.0)
Immature Granulocytes: 1 %
Lymphocytes Relative: 20 %
Lymphs Abs: 0.8 10*3/uL (ref 0.7–4.0)
MCH: 32.8 pg (ref 26.0–34.0)
MCHC: 31 g/dL (ref 30.0–36.0)
MCV: 105.8 fL — ABNORMAL HIGH (ref 80.0–100.0)
Monocytes Absolute: 0.6 10*3/uL (ref 0.1–1.0)
Monocytes Relative: 14 %
Neutro Abs: 2.6 10*3/uL (ref 1.7–7.7)
Neutrophils Relative %: 60 %
Platelets: 204 10*3/uL (ref 150–400)
RBC: 2.93 MIL/uL — ABNORMAL LOW (ref 3.87–5.11)
RDW: 17.2 % — ABNORMAL HIGH (ref 11.5–15.5)
WBC: 4.3 10*3/uL (ref 4.0–10.5)
nRBC: 0 % (ref 0.0–0.2)

## 2023-03-02 LAB — COMPREHENSIVE METABOLIC PANEL
ALT: 12 U/L (ref 0–44)
AST: 20 U/L (ref 15–41)
Albumin: 3.7 g/dL (ref 3.5–5.0)
Alkaline Phosphatase: 98 U/L (ref 38–126)
Anion gap: 15 (ref 5–15)
BUN: 25 mg/dL — ABNORMAL HIGH (ref 8–23)
CO2: 26 mmol/L (ref 22–32)
Calcium: 10 mg/dL (ref 8.9–10.3)
Chloride: 97 mmol/L — ABNORMAL LOW (ref 98–111)
Creatinine, Ser: 6.51 mg/dL — ABNORMAL HIGH (ref 0.44–1.00)
GFR, Estimated: 6 mL/min — ABNORMAL LOW (ref >60)
Glucose, Bld: 76 mg/dL (ref 70–99)
Potassium: 4.9 mmol/L (ref 3.5–5.1)
Sodium: 138 mmol/L (ref 135–145)
Total Bilirubin: 0.6 mg/dL (ref <1.2)
Total Protein: 7.1 g/dL (ref 6.5–8.1)

## 2023-03-02 LAB — MAGNESIUM: Magnesium: 2.6 mg/dL — ABNORMAL HIGH (ref 1.7–2.4)

## 2023-03-02 MED ORDER — PROCHLORPERAZINE MALEATE 10 MG PO TABS
10.0000 mg | ORAL_TABLET | Freq: Once | ORAL | Status: AC
  Administered 2023-03-02: 10 mg via ORAL
  Filled 2023-03-02: qty 1

## 2023-03-02 MED ORDER — BORTEZOMIB CHEMO SQ INJECTION 3.5 MG (2.5MG/ML)
1.2000 mg/m2 | Freq: Once | INTRAMUSCULAR | Status: AC
Start: 2023-03-02 — End: 2023-03-02
  Administered 2023-03-02: 2 mg via SUBCUTANEOUS
  Filled 2023-03-02: qty 0.8

## 2023-03-02 NOTE — Progress Notes (Signed)
Patient has been examined by Dr. Katragadda. Vital signs and labs have been reviewed by MD - ANC, Creatinine, LFTs, hemoglobin, and platelets are within treatment parameters per M.D. - pt may proceed with treatment.  Primary RN and pharmacy notified.  

## 2023-03-02 NOTE — Patient Instructions (Signed)
[facility] at [facility] Discharge Instructions   You were seen and examined today by Dr. Ellin Saba.  He reviewed the results of your lab work which are mostly normal/stable. Your multiple myeloma labs are trending upwards. Dr. Kirtland Bouchard recommends intensifying the treatment, either by increasing the frequency of the shots you receive to treat the myeloma.   We will proceed with your treatment today.   Return as scheduled.    Thank you for choosing [facility] at [facility] to provide your oncology and hematology care.  To afford each patient quality time with our provider, please arrive at least 15 minutes before your scheduled appointment time.   If you have a lab appointment with the Cancer Center please come in thru the Main Entrance and check in at the main information desk.  You need to re-schedule your appointment should you arrive 10 or more minutes late.  We strive to give you quality time with our providers, and arriving late affects you and other patients whose appointments are after yours.  Also, if you no show three or more times for appointments you may be dismissed from the clinic at the providers discretion.     Again, thank you for choosing [facility]  Our hope is that these requests will decrease the amount of time that you wait before being seen by our physicians.       _____________________________________________________________  Should you have questions after your visit to [facility], please contact our office at [phone] and follow the prompts.  Our office hours are 8:00 a.m. and 4:30 p.m. Monday - Friday.  Please note that voicemails left after 4:00 p.m. may not be returned until the following business day.  We are closed weekends and major holidays.  You do have access to a nurse 24-7, just call the main number to the clinic [phone] and do not press any options, hold on the line and a nurse  will answer the phone.    For prescription refill requests, have your pharmacy contact our office and allow 72 hours.    Due to Covid, you will need to wear a mask upon entering the hospital. If you do not have a mask, a mask will be given to you at the Main Entrance upon arrival. For doctor visits, patients may have 1 support person age 78 or older with them. For treatment visits, patients can not have anyone with them due to social distancing guidelines and our immunocompromised population.

## 2023-03-02 NOTE — Progress Notes (Signed)
 Patient tolerated Velcade injection with no complaints voiced.  Site clean and dry with no bruising or swelling noted at site.  See MAR for details.  Band aid applied.  Patient stable during and after injection.  Vss with discharge and left in satisfactory condition with no s/s of distress noted. All follow ups as scheduled.  Carly Wood [employer]

## 2023-03-02 NOTE — Patient Instructions (Signed)
 [facility] [place] - A DEPT OF [facility]  Discharge Instructions: Thank you for choosing [facility] to provide your oncology and hematology care.  If you have a lab appointment with the Cancer Center - please note that after April 8th, 2024, all labs will be drawn in the cancer center.  You do not have to check in or register with the main entrance as you have in the past but will complete your check-in in the cancer center.  Wear comfortable clothing and clothing appropriate for easy access to any Portacath or PICC line.   We strive to give you quality time with your provider. You may need to reschedule your appointment if you arrive late (15 or more minutes).  Arriving late affects you and other patients whose appointments are after yours.  Also, if you miss three or more appointments without notifying the office, you may be dismissed from the clinic at the provider's discretion.      For prescription refill requests, have your pharmacy contact our office and allow 72 hours for refills to be completed.    Today you received the following chemotherapy and/or immunotherapy agents velcade   To help prevent nausea and vomiting after your treatment, we encourage you to take your nausea medication as directed.  BELOW ARE SYMPTOMS THAT SHOULD BE REPORTED IMMEDIATELY: *FEVER GREATER THAN 100.4 F (38 C) OR HIGHER *CHILLS OR SWEATING *NAUSEA AND VOMITING THAT IS NOT CONTROLLED WITH YOUR NAUSEA MEDICATION *UNUSUAL SHORTNESS OF BREATH *UNUSUAL BRUISING OR BLEEDING *URINARY PROBLEMS (pain or burning when urinating, or frequent urination) *BOWEL PROBLEMS (unusual diarrhea, constipation, pain near the anus) TENDERNESS IN MOUTH AND THROAT WITH OR WITHOUT PRESENCE OF ULCERS (sore throat, sores in mouth, or a toothache) UNUSUAL RASH, SWELLING OR PAIN  UNUSUAL VAGINAL DISCHARGE OR ITCHING   Items with * indicate a potential emergency and should be followed up as  soon as possible or go to the Emergency Department if any problems should occur.  Please show the CHEMOTHERAPY ALERT CARD or IMMUNOTHERAPY ALERT CARD at check-in to the Emergency Department and triage nurse.  Should you have questions after your visit or need to cancel or reschedule your appointment, please contact [facility]  - A DEPT OF [facility] [phone]  and follow the prompts.  Office hours are 8:00 a.m. to 4:30 p.m. Monday - Friday. Please note that voicemails left after 4:00 p.m. may not be returned until the following business day.  We are closed weekends and major holidays. You have access to a nurse at all times for urgent questions. Please call the main number to the clinic [phone] and follow the prompts.  For any non-urgent questions, you may also contact your provider using MyChart. We now offer e-Visits for anyone 63 and older to request care online for non-urgent symptoms. For details visit [email or web address].   Also download the MyChart app! Go to the app store, search "MyChart", open the app, select [place], and log in with your MyChart username and password.

## 2023-03-03 ENCOUNTER — Other Ambulatory Visit: Payer: Self-pay

## 2023-03-04 DIAGNOSIS — N186 End stage renal disease: Secondary | ICD-10-CM | POA: Diagnosis not present

## 2023-03-04 DIAGNOSIS — Z992 Dependence on renal dialysis: Secondary | ICD-10-CM | POA: Diagnosis not present

## 2023-03-05 DIAGNOSIS — E119 Type 2 diabetes mellitus without complications: Secondary | ICD-10-CM | POA: Diagnosis not present

## 2023-03-05 DIAGNOSIS — Z992 Dependence on renal dialysis: Secondary | ICD-10-CM | POA: Diagnosis not present

## 2023-03-05 DIAGNOSIS — N186 End stage renal disease: Secondary | ICD-10-CM | POA: Diagnosis not present

## 2023-03-08 DIAGNOSIS — Z992 Dependence on renal dialysis: Secondary | ICD-10-CM | POA: Diagnosis not present

## 2023-03-08 DIAGNOSIS — N186 End stage renal disease: Secondary | ICD-10-CM | POA: Diagnosis not present

## 2023-03-10 DIAGNOSIS — Z992 Dependence on renal dialysis: Secondary | ICD-10-CM | POA: Diagnosis not present

## 2023-03-10 DIAGNOSIS — N186 End stage renal disease: Secondary | ICD-10-CM | POA: Diagnosis not present

## 2023-03-12 DIAGNOSIS — Z992 Dependence on renal dialysis: Secondary | ICD-10-CM | POA: Diagnosis not present

## 2023-03-12 DIAGNOSIS — N186 End stage renal disease: Secondary | ICD-10-CM | POA: Diagnosis not present

## 2023-03-15 DIAGNOSIS — N186 End stage renal disease: Secondary | ICD-10-CM | POA: Diagnosis not present

## 2023-03-15 DIAGNOSIS — Z992 Dependence on renal dialysis: Secondary | ICD-10-CM | POA: Diagnosis not present

## 2023-03-16 ENCOUNTER — Inpatient Hospital Stay: Payer: Medicare PPO | Attending: Hematology

## 2023-03-16 ENCOUNTER — Other Ambulatory Visit: Payer: Self-pay

## 2023-03-16 ENCOUNTER — Emergency Department (HOSPITAL_COMMUNITY): Payer: Medicare PPO

## 2023-03-16 ENCOUNTER — Emergency Department (HOSPITAL_COMMUNITY)
Admission: EM | Admit: 2023-03-16 | Discharge: 2023-03-16 | Disposition: A | Discharge status: Home / Self Care | Payer: Medicare PPO | Attending: Emergency Medicine | Admitting: Emergency Medicine

## 2023-03-16 ENCOUNTER — Encounter (HOSPITAL_COMMUNITY): Payer: Self-pay

## 2023-03-16 ENCOUNTER — Inpatient Hospital Stay: Payer: Medicare PPO

## 2023-03-16 VITALS — BP 150/72 | HR 104 | Temp 97.7°F | Resp 18 | Wt 143.3 lb

## 2023-03-16 DIAGNOSIS — Z5112 Encounter for antineoplastic immunotherapy: Secondary | ICD-10-CM | POA: Insufficient documentation

## 2023-03-16 DIAGNOSIS — R739 Hyperglycemia, unspecified: Secondary | ICD-10-CM

## 2023-03-16 DIAGNOSIS — I120 Hypertensive chronic kidney disease with stage 5 chronic kidney disease or end stage renal disease: Secondary | ICD-10-CM | POA: Insufficient documentation

## 2023-03-16 DIAGNOSIS — Z79899 Other long term (current) drug therapy: Secondary | ICD-10-CM | POA: Diagnosis not present

## 2023-03-16 DIAGNOSIS — Z853 Personal history of malignant neoplasm of breast: Secondary | ICD-10-CM | POA: Diagnosis not present

## 2023-03-16 DIAGNOSIS — F0390 Unspecified dementia without behavioral disturbance: Secondary | ICD-10-CM | POA: Diagnosis not present

## 2023-03-16 DIAGNOSIS — Z992 Dependence on renal dialysis: Secondary | ICD-10-CM | POA: Diagnosis not present

## 2023-03-16 DIAGNOSIS — I771 Stricture of artery: Secondary | ICD-10-CM | POA: Diagnosis not present

## 2023-03-16 DIAGNOSIS — R079 Chest pain, unspecified: Secondary | ICD-10-CM | POA: Diagnosis not present

## 2023-03-16 DIAGNOSIS — Z452 Encounter for adjustment and management of vascular access device: Secondary | ICD-10-CM | POA: Diagnosis not present

## 2023-03-16 DIAGNOSIS — C9000 Multiple myeloma not having achieved remission: Secondary | ICD-10-CM | POA: Insufficient documentation

## 2023-03-16 DIAGNOSIS — E1122 Type 2 diabetes mellitus with diabetic chronic kidney disease: Secondary | ICD-10-CM | POA: Insufficient documentation

## 2023-03-16 DIAGNOSIS — N186 End stage renal disease: Secondary | ICD-10-CM | POA: Insufficient documentation

## 2023-03-16 DIAGNOSIS — R0789 Other chest pain: Secondary | ICD-10-CM | POA: Diagnosis not present

## 2023-03-16 DIAGNOSIS — I700 Atherosclerosis of aorta: Secondary | ICD-10-CM | POA: Diagnosis not present

## 2023-03-16 LAB — CBC WITH DIFFERENTIAL/PLATELET
Abs Immature Granulocytes: 0.01 10*3/uL (ref 0.00–0.07)
Basophils Absolute: 0 10*3/uL (ref 0.0–0.1)
Basophils Relative: 1 %
Eosinophils Absolute: 0.2 10*3/uL (ref 0.0–0.5)
Eosinophils Relative: 4 %
HCT: 30.4 % — ABNORMAL LOW (ref 36.0–46.0)
Hemoglobin: 9.5 g/dL — ABNORMAL LOW (ref 12.0–15.0)
Immature Granulocytes: 0 %
Lymphocytes Relative: 19 %
Lymphs Abs: 0.8 10*3/uL (ref 0.7–4.0)
MCH: 33.3 pg (ref 26.0–34.0)
MCHC: 31.3 g/dL (ref 30.0–36.0)
MCV: 106.7 fL — ABNORMAL HIGH (ref 80.0–100.0)
Monocytes Absolute: 0.4 10*3/uL (ref 0.1–1.0)
Monocytes Relative: 9 %
Neutro Abs: 3.1 10*3/uL (ref 1.7–7.7)
Neutrophils Relative %: 67 %
Platelets: 201 10*3/uL (ref 150–400)
RBC: 2.85 MIL/uL — ABNORMAL LOW (ref 3.87–5.11)
RDW: 16.7 % — ABNORMAL HIGH (ref 11.5–15.5)
WBC: 4.6 10*3/uL (ref 4.0–10.5)
nRBC: 0 % (ref 0.0–0.2)

## 2023-03-16 LAB — CBC
HCT: 31.9 % — ABNORMAL LOW (ref 36.0–46.0)
Hemoglobin: 10.2 g/dL — ABNORMAL LOW (ref 12.0–15.0)
MCH: 34.2 pg — ABNORMAL HIGH (ref 26.0–34.0)
MCHC: 32 g/dL (ref 30.0–36.0)
MCV: 107 fL — ABNORMAL HIGH (ref 80.0–100.0)
Platelets: 234 10*3/uL (ref 150–400)
RBC: 2.98 MIL/uL — ABNORMAL LOW (ref 3.87–5.11)
RDW: 17 % — ABNORMAL HIGH (ref 11.5–15.5)
WBC: 4.7 10*3/uL (ref 4.0–10.5)
nRBC: 0 % (ref 0.0–0.2)

## 2023-03-16 LAB — COMPREHENSIVE METABOLIC PANEL
ALT: 12 U/L (ref 0–44)
AST: 19 U/L (ref 15–41)
Albumin: 3.7 g/dL (ref 3.5–5.0)
Alkaline Phosphatase: 84 U/L (ref 38–126)
Anion gap: 11 (ref 5–15)
BUN: 23 mg/dL (ref 8–23)
CO2: 27 mmol/L (ref 22–32)
Calcium: 9.6 mg/dL (ref 8.9–10.3)
Chloride: 96 mmol/L — ABNORMAL LOW (ref 98–111)
Creatinine, Ser: 6.12 mg/dL — ABNORMAL HIGH (ref 0.44–1.00)
GFR, Estimated: 6 mL/min — ABNORMAL LOW (ref >60)
Glucose, Bld: 228 mg/dL — ABNORMAL HIGH (ref 70–99)
Potassium: 5.1 mmol/L (ref 3.5–5.1)
Sodium: 134 mmol/L — ABNORMAL LOW (ref 135–145)
Total Bilirubin: 0.4 mg/dL (ref 0.0–1.2)
Total Protein: 7.2 g/dL (ref 6.5–8.1)

## 2023-03-16 LAB — BASIC METABOLIC PANEL
Anion gap: 13 (ref 5–15)
BUN: 27 mg/dL — ABNORMAL HIGH (ref 8–23)
CO2: 27 mmol/L (ref 22–32)
Calcium: 9.4 mg/dL (ref 8.9–10.3)
Chloride: 96 mmol/L — ABNORMAL LOW (ref 98–111)
Creatinine, Ser: 6.3 mg/dL — ABNORMAL HIGH (ref 0.44–1.00)
GFR, Estimated: 6 mL/min — ABNORMAL LOW (ref >60)
Glucose, Bld: 103 mg/dL — ABNORMAL HIGH (ref 70–99)
Potassium: 4.7 mmol/L (ref 3.5–5.1)
Sodium: 136 mmol/L (ref 135–145)

## 2023-03-16 LAB — TROPONIN I (HIGH SENSITIVITY)
Troponin I (High Sensitivity): 73 ng/L — ABNORMAL HIGH (ref <18)
Troponin I (High Sensitivity): 73 ng/L — ABNORMAL HIGH (ref <18)

## 2023-03-16 LAB — MAGNESIUM: Magnesium: 2.4 mg/dL (ref 1.7–2.4)

## 2023-03-16 MED ORDER — PROCHLORPERAZINE MALEATE 10 MG PO TABS
10.0000 mg | ORAL_TABLET | Freq: Once | ORAL | Status: AC
  Administered 2023-03-16: 10 mg via ORAL
  Filled 2023-03-16: qty 1

## 2023-03-16 MED ORDER — BORTEZOMIB CHEMO SQ INJECTION 3.5 MG (2.5MG/ML)
1.2000 mg/m2 | Freq: Once | INTRAMUSCULAR | Status: AC
  Administered 2023-03-16: 2 mg via SUBCUTANEOUS
  Filled 2023-03-16: qty 0.8

## 2023-03-16 MED ORDER — INSULIN ASPART 100 UNIT/ML IJ SOLN
10.0000 [IU] | Freq: Once | INTRAMUSCULAR | Status: AC
Start: 2023-03-16 — End: 2023-03-16
  Administered 2023-03-16: 10 [IU] via SUBCUTANEOUS
  Filled 2023-03-16: qty 0.1

## 2023-03-16 NOTE — Progress Notes (Signed)
 Patient presents today for chemotherapy injection of Velcade .  Patient is in satisfactory condition with no new complaints voiced.  Vital signs are stable.  Labs reviewed and all labs are within treatment parameters. Patient's blood sugar 228, 10 units of Novolog  given SQ per Dr Charmain order.  We will proceed with treatment per MD orders.    Patient tolerated treatment well with no complaints voiced.   Patient started to complain of mid-sternal chest pressure while waiting for ride. Denies any shortness of breath, dizziness, or diaphoresis. Patient drank multiple sodas. Patient asked to belch. After patient belched x2 she denies any relief. Dr Rogers aware and patient recommended to go to ER. Patient taken to ER via wheelchair by [facility] staff that arrived.  Report called to [facility] in ER.

## 2023-03-16 NOTE — ED Provider Notes (Signed)
 [place] EMERGENCY DEPARTMENT AT [facility] Provider Note   CSN: [id number] Arrival date & time: 03/16/23  1331     History  Chief Complaint  Patient presents with   Chest Pain    Carly Wood is a 88 y.o. female.   Chest Pain Associated symptoms: no abdominal pain, no back pain, no dizziness, no headache, no nausea, no shortness of breath and no vomiting        Carly Wood is a 88 y.o. female significant past medical history including breast cancer, multiple myeloma, anxiety, type 2 diabetes, dementia, hypertension, end-stage renal disease on hemodialysis Monday Wednesday Friday.  Currently taking chemotherapy at the [facility].  She was brought to the ED for evaluation of central chest pain that occurred while waiting on her ride from the cancer center.  Per caregiver, patient drank a couple of sodas earlier and began having chest pain.  She received her chemotherapy treatment and was noted to have elevated blood sugar and was given insulin  at the cancer center.  Currently resides at the [facility].  On arrival here, patient states that she was having some central chest pain earlier but states her pain is now resolved.  Home Medications Prior to Admission medications   Medication Sig Start Date End Date Taking? Authorizing Provider  acetaminophen  (TYLENOL ) 325 MG tablet Take 650 mg by mouth 3 (three) times daily.    [provider]  acyclovir  (ZOVIRAX ) 200 MG capsule Take 200 mg by mouth in the morning. (0800) 10/20/19   [provider]  anastrozole  (ARIMIDEX ) 1 MG tablet TAKE 1 TABLET BY MOUTH DAILY 11/05/20   [name], MD  Calcium  Carb-Cholecalciferol  (CALCIUM  + VITAMIN D3) 500-5 MG-MCG TABS Take 1 tablet by mouth daily.    [provider]  melatonin 5 MG TABS Take 5 mg by mouth at bedtime.    [provider]  multivitamin (RENA-VIT) TABS tablet Take 1 tablet by mouth at bedtime.    [provider]  Nutritional Supplements (ENSURE ENLIVE PO) Take 237 mLs by mouth every evening.    [provider]  ondansetron  (ZOFRAN -ODT) 4 MG disintegrating tablet Take 4 mg by mouth daily before breakfast. 01/01/22   [provider]  oseltamivir (TAMIFLU) 30 MG capsule Take 30 mg by mouth 2 (two) times daily. 03/05/23   [provider]  pantoprazole  (PROTONIX ) 40 MG tablet Take 40 mg by mouth 2 (two) times daily.    [provider]  sennosides-docusate sodium  (SENOKOT-S) 8.6-50 MG tablet Take 1 tablet by mouth 2 (two) times daily.    [provider]  sertraline  (ZOLOFT ) 25 MG tablet Take 25 mg by mouth daily. 11/01/22   [provider]  sevelamer  carbonate (RENVELA ) 800 MG tablet Take by mouth. 01/05/23   [provider]  sucralfate  (CARAFATE ) 1 GM/10ML suspension Take 1 g by mouth in the morning, at noon, in the evening, and at bedtime.    [provider]      Allergies    Ace inhibitors, Angiotensin receptor blockers, and Penicillins    Review of Systems   Review of Systems  Constitutional:  Negative for appetite change.  HENT:  Negative for sore throat.   Respiratory:  Negative for shortness of breath.   Cardiovascular:  Positive for chest pain.  Gastrointestinal:  Negative for abdominal pain, nausea and vomiting.  Musculoskeletal:  Negative for arthralgias and back pain.  Neurological:  Negative for dizziness and headaches.  Physical Exam Updated Vital Signs BP 129/67 (BP Location: Right Arm)   Pulse (!) 101   Temp 98.2 F (36.8 C) (Oral)   Resp 16   Ht 4' 9 (1.448 m)   Wt 64.9 kg   SpO2 100%   BMI 30.94 kg/m  Physical Exam Vitals and nursing note reviewed.  Constitutional:      General: She is not in acute distress.    Appearance: Normal appearance. She is not ill-appearing or toxic-appearing.  HENT:     Mouth/Throat:     Mouth: Mucous membranes are moist.  Cardiovascular:     Rate and  Rhythm: Normal rate and regular rhythm.     Pulses: Normal pulses.     Comments: Patient is has dialysis catheter left chest Pulmonary:     Effort: Pulmonary effort is normal.  Chest:     Chest wall: No tenderness.  Abdominal:     Palpations: Abdomen is soft.     Tenderness: There is no abdominal tenderness.  Musculoskeletal:     Cervical back: Normal range of motion. No tenderness.     Right lower leg: No edema.     Left lower leg: No edema.  Skin:    General: Skin is warm.     Capillary Refill: Capillary refill takes less than 2 seconds.  Neurological:     General: No focal deficit present.     Mental Status: She is alert.     Sensory: No sensory deficit.     Motor: No weakness.     ED Results / Procedures / Treatments   Labs (all labs ordered are listed, but only abnormal results are displayed) Labs Reviewed  BASIC METABOLIC PANEL - Abnormal; Notable for the following components:      Result Value   Chloride 96 (*)    Glucose, Bld 103 (*)    BUN 27 (*)    Creatinine, Ser 6.30 (*)    GFR, Estimated 6 (*)    All other components within normal limits  CBC - Abnormal; Notable for the following components:   RBC 2.98 (*)    Hemoglobin 10.2 (*)    HCT 31.9 (*)    MCV 107.0 (*)    MCH 34.2 (*)    RDW 17.0 (*)    All other components within normal limits  TROPONIN I (HIGH SENSITIVITY) - Abnormal; Notable for the following components:   Troponin I (High Sensitivity) 73 (*)    All other components within normal limits  TROPONIN I (HIGH SENSITIVITY) - Abnormal; Notable for the following components:   Troponin I (High Sensitivity) 73 (*)    All other components within normal limits    EKG EKG Interpretation Date/Time:  Tuesday March 16 2023 13:54:07 EST Ventricular Rate:  101 PR Interval:  170 QRS Duration:  70 QT Interval:  354 QTC Calculation: 459 R Axis:   25  Text Interpretation: Sinus tachycardia Otherwise normal ECG When compared with ECG of 16-Dec-2022  18:18, PREVIOUS ECG IS PRESENT Confirmed by Cleotilde Rogue ([id number]) on 03/16/2023 1:58:28 PM  Radiology DG Chest 2 View Result Date: 03/16/2023 CLINICAL DATA:  Chest pain EXAM: CHEST - 2 VIEW COMPARISON:  12/18/2022 FINDINGS: Heart size is normal. Aortic atherosclerosis and tortuosity as seen previously. Central line tip at the [facility] RA junction. The lungs are clear. No edema, infiltrate, collapse or effusion. No acute bone finding. IMPRESSION: No active disease. Aortic atherosclerosis and tortuosity. Central line tip at the [facility] RA junction. Electronically Signed  By: Oneil Officer M.D.   On: 03/16/2023 14:53    Procedures Procedures    Medications Ordered in ED Medications - No data to display  ED Course/ Medical Decision Making/ A&P                                 Medical Decision Making Patient at cancer center earlier today for chemotherapy treatment.  Drinks several sodas and began having chest pain, resides at the [facility] and was sent here for evaluation of her chest pain.  On arrival, patient states chest pain has improved.  No significant cardiac history noted.  On my exam, patient resting comfortably.  Vital signs reviewed.  She denies any chest pain, requesting food  Differential includes but not limited to ACS, MSK, PE, pneumonia, arrhythmia, chest pain related to GERD  Amount and/or Complexity of Data Reviewed Labs: ordered.    Details: Labs interpreted by me, no evidence of leukocytosis, hemoglobin 10.2 but near baseline, chemistries BUN and serum creatinine elevated.  Patient is dialysis patient was last dialyzed 1 day ago.  Initial troponin elevated at 73, delta troponin unchanged Radiology: ordered.    Details: Chest x-ray shows no active disease ECG/medicine tests: ordered.    Details: EKG shows sinus tachycardia otherwise normal EKG  Discussion of management or test interpretation with external provider(s): On recheck, patient continues to deny having chest pain.   Requesting food.  Troponins are elevated but flat.  She does not have significant cardiac history.  Her EKG is reassuring, without continued chest pain, feel that patient is appropriate for discharge back to facility.  I have placed ambulatory referral to cardiology.  Discussed care plan with Dr. Jakie           Final Clinical Impression(s) / ED Diagnoses Final diagnoses:  Nonspecific chest pain    Rx / DC Orders ED Discharge Orders     None         Herlinda Milling, PA-C 03/16/23 1945    Yolande Lamar BROCKS, MD 03/23/23 1147

## 2023-03-16 NOTE — ED Notes (Signed)
 Patient transported to X-ray

## 2023-03-16 NOTE — ED Triage Notes (Addendum)
 Pt brought over from [facility].  Pt reports she started having central chest pain while at the Cancer Ctr and waiting for her ride home.  Pt sts I drank a couple sodas.  Pt denies associated symptoms.   Pt received chemo and insulin  at [facility].     Hx of dementia.  Pt lives at [facility].

## 2023-03-16 NOTE — Patient Instructions (Signed)
 [facility] [place] - A DEPT OF [facility]  Discharge Instructions: Thank you for choosing [facility] to provide your oncology and hematology care.  If you have a lab appointment with the Cancer Center - please note that after April 8th, 2024, all labs will be drawn in the cancer center.  You do not have to check in or register with the main entrance as you have in the past but will complete your check-in in the cancer center.  Wear comfortable clothing and clothing appropriate for easy access to any Portacath or PICC line.   We strive to give you quality time with your provider. You may need to reschedule your appointment if you arrive late (15 or more minutes).  Arriving late affects you and other patients whose appointments are after yours.  Also, if you miss three or more appointments without notifying the office, you may be dismissed from the clinic at the provider's discretion.      For prescription refill requests, have your pharmacy contact our office and allow 72 hours for refills to be completed.    Today you received the following chemotherapy and/or immunotherapy agents Velcade. Bortezomib Injection What is this medication? BORTEZOMIB (bor TEZ oh mib) treats lymphoma. It may also be used to treat multiple myeloma, a type of bone marrow cancer. It works by blocking a protein that causes cancer cells to grow and multiply. This helps to slow or stop the spread of cancer cells. This medicine may be used for other purposes; ask your health care provider or pharmacist if you have questions. COMMON BRAND NAME(S): Velcade What should I tell my care team before I take this medication? They need to know if you have any of these conditions: Dehydration Diabetes Heart disease Liver disease Tingling of the fingers or toes or other nerve disorder An unusual or allergic reaction to bortezomib, other medications, foods, dyes, or preservatives If you or  your partner are pregnant or trying to get pregnant Breastfeeding How should I use this medication? This medication is injected into a vein or under the skin. It is given by your care team in a hospital or clinic setting. Talk to your care team about the use of this medication in children. Special care may be needed. Overdosage: If you think you have taken too much of this medicine contact a poison control center or emergency room at once. NOTE: This medicine is only for you. Do not share this medicine with others. What if I miss a dose? Keep appointments for follow-up doses. It is important not to miss your dose. Call your care team if you are unable to keep an appointment. What may interact with this medication? Ketoconazole Rifampin This list may not describe all possible interactions. Give your health care provider a list of all the medicines, herbs, non-prescription drugs, or dietary supplements you use. Also tell them if you smoke, drink alcohol, or use illegal drugs. Some items may interact with your medicine. What should I watch for while using this medication? Your condition will be monitored carefully while you are receiving this medication. You may need blood work while taking this medication. This medication may affect your coordination, reaction time, or judgment. Do not drive or operate machinery until you know how this medication affects you. Sit up or stand slowly to reduce the risk of dizzy or fainting spells. Drinking alcohol with this medication can increase the risk of these side effects. This medication  may increase your risk of getting an infection. Call your care team for advice if you get a fever, chills, sore throat, or other symptoms of a cold or flu. Do not treat yourself. Try to avoid being around people who are sick. Check with your care team if you have severe diarrhea, nausea, and vomiting, or if you sweat a lot. The loss of too much body fluid may make it dangerous  for you to take this medication. Talk to your care team if you may be pregnant. Serious birth defects can occur if you take this medication during pregnancy and for 7 months after the last dose. You will need a negative pregnancy test before starting this medication. Contraception is recommended while taking this medication and for 7 months after the last dose. Your care team can help you find the option that works for you. If your partner can get pregnant, use a condom during sex while taking this medication and for 4 months after the last dose. Do not breastfeed while taking this medication and for 2 months after the last dose. This medication may cause infertility. Talk to your care team if you are concerned about your fertility. What side effects may I notice from receiving this medication? Side effects that you should report to your care team as soon as possible: Allergic reactions--skin rash, itching, hives, swelling of the face, lips, tongue, or throat Bleeding--bloody or black, tar-like stools, vomiting blood or  material that looks like coffee grounds, red or dark  urine, small red or purple spots on skin, unusual bruising or bleeding Bleeding in the brain--severe headache, stiff neck, confusion, dizziness, change in vision, numbness or weakness of the face, arm, or leg, trouble speaking, trouble walking, vomiting Bowel blockage--stomach cramping, unable to have a bowel movement or pass gas, loss of appetite, vomiting Heart failure--shortness of breath, swelling of the ankles, feet, or hands, sudden weight gain, unusual weakness or fatigue Infection--fever, chills, cough, sore throat, wounds that don't heal, pain or trouble when passing urine, general feeling of discomfort or being unwell Liver injury--right upper belly pain, loss of appetite, nausea, light-colored stool, dark yellow or  urine, yellowing skin or eyes, unusual weakness or fatigue Low blood pressure--dizziness,  feeling faint or lightheaded, blurry vision Lung injury--shortness of breath or trouble breathing, cough, spitting up blood, chest pain, fever Pain, tingling, or numbness in the hands or feet Severe or prolonged diarrhea Stomach pain, bloody diarrhea, pale skin, unusual weakness or fatigue, decrease in the amount of urine, which may be signs of hemolytic uremic syndrome Sudden and severe headache, confusion, change in vision, seizures, which may be signs of posterior reversible encephalopathy syndrome (PRES) TTP--purple spots on the skin or inside the mouth, pale skin, yellowing skin or eyes, unusual weakness or fatigue, fever, fast or irregular heartbeat, confusion, change in vision, trouble speaking, trouble walking Tumor lysis syndrome (TLS)--nausea, vomiting, diarrhea, decrease in the amount of urine, dark urine, unusual weakness or fatigue, confusion, muscle pain or cramps, fast or irregular heartbeat, joint pain Side effects that usually do not require medical attention (report to your care team if they continue or are bothersome): Constipation Diarrhea Fatigue Loss of appetite Nausea This list may not describe all possible side effects. Call your doctor for medical advice about side effects. You may report side effects to FDA at 1-800-FDA-1088. Where should I keep my medication? This medication is given in a hospital or clinic. It will not be stored at home. NOTE: This sheet is a  summary. It may not cover all possible information. If you have questions about this medicine, talk to your doctor, pharmacist, or health care provider.  2024 Elsevier/Gold Standard (2021-07-29 00:00:00)       To help prevent nausea and vomiting after your treatment, we encourage you to take your nausea medication as directed.  BELOW ARE SYMPTOMS THAT SHOULD BE REPORTED IMMEDIATELY: *FEVER GREATER THAN 100.4 F (38 C) OR HIGHER *CHILLS OR SWEATING *NAUSEA AND VOMITING THAT IS NOT CONTROLLED WITH YOUR NAUSEA  MEDICATION *UNUSUAL SHORTNESS OF BREATH *UNUSUAL BRUISING OR BLEEDING *URINARY PROBLEMS (pain or burning when urinating, or frequent urination) *BOWEL PROBLEMS (unusual diarrhea, constipation, pain near the anus) TENDERNESS IN MOUTH AND THROAT WITH OR WITHOUT PRESENCE OF ULCERS (sore throat, sores in mouth, or a toothache) UNUSUAL RASH, SWELLING OR PAIN  UNUSUAL VAGINAL DISCHARGE OR ITCHING   Items with * indicate a potential emergency and should be followed up as soon as possible or go to the Emergency Department if any problems should occur.  Please show the CHEMOTHERAPY ALERT CARD or IMMUNOTHERAPY ALERT CARD at check-in to the Emergency Department and triage nurse.  Should you have questions after your visit or need to cancel or reschedule your appointment, please contact [facility] CAN[facility]ER [facility] [place] - A DEPT OF [facility] [phone]  and follow the prompts.  Office hours are 8:00 a.m. to 4:30 p.m. Monday - Friday. Please note that voicemails left after 4:00 p.m. may not be returned until the following business day.  We are closed weekends and major holidays. You have access to a nurse at all times for urgent questions. Please call the main number to the clinic [phone] and follow the prompts.  For any non-urgent questions, you may also contact your provider using MyChart. We now offer e-Visits for anyone 33 and older to request care online for non-urgent symptoms. For details visit [email or web address].   Also download the MyChart app! Go to the app store, search "MyChart", open the app, select [place], and log in with your MyChart username and password.

## 2023-03-16 NOTE — Discharge Instructions (Signed)
 Troponins today were elevated but unchanged.  A referral has been place for close cardiology follow-up.  Someone from the cardiology office should be reaching out to arrange appointment time.  Please return to the emergency department for any new or worsening symptoms.

## 2023-03-16 NOTE — ED Notes (Signed)
Patient given turkey sandwich and chips.

## 2023-03-17 ENCOUNTER — Encounter: Payer: Self-pay | Admitting: Adult Health

## 2023-03-17 ENCOUNTER — Non-Acute Institutional Stay (SKILLED_NURSING_FACILITY): Payer: Medicare PPO | Admitting: Adult Health

## 2023-03-17 DIAGNOSIS — Z992 Dependence on renal dialysis: Secondary | ICD-10-CM | POA: Diagnosis not present

## 2023-03-17 DIAGNOSIS — I700 Atherosclerosis of aorta: Secondary | ICD-10-CM | POA: Diagnosis not present

## 2023-03-17 DIAGNOSIS — N186 End stage renal disease: Secondary | ICD-10-CM | POA: Diagnosis not present

## 2023-03-17 NOTE — Progress Notes (Signed)
 Location:  [facility] Room Number: 149 Place of Service:  SNF (31)   CODE STATUS: dnr   Allergies  Allergen Reactions   Ace Inhibitors Other (See Comments) and Cough    Tongue swelling Angioedema    Angiotensin Receptor Blockers Other (See Comments)    Angioedema with ACE-I   Penicillins Other (See Comments)    Unknown reaction    Chief Complaint  Patient presents with   Acute Visit    Follow up ED visit     HPI:  She had been at the cancer to receive her chemotherapy. While waiting for her ride back to the facility she developed mid-sternal chest pain. She had had several sodas prior to arrival. She did belch; and her pain did self relief. Her blood work and EKG did not demonstrate any acute involvement. She will need to follow up with cardiology on an outpatient basis.  Today she states that she is feeling good any denies any further chest pain.      Past Medical History:  Diagnosis Date   Anemia     chronic macrocytic anemia   Anxiety    Chronic kidney disease    Chronic renal disease, stage 4, severely decreased glomerular filtration rate (GFR) between 15-29 mL/min/1.73 square meter (HCC) 08/22/2015   Complication of anesthesia    delirious after Breast Surgery   Dementia (HCC)    mild   Depression    Diabetes mellitus with ESRD (end-stage renal disease) (HCC)    type II   Dysphagia    Dyspnea    with activity   GERD (gastroesophageal reflux disease)    Glaucoma    Hyperlipidemia    Hypertension    Multiple myeloma (HCC)    Pneumonia    Stage 1 infiltrating ductal carcinoma of right female breast (HCC) 08/21/2015   ER+ PR+ HER 2 neu + (3+) T1cN0     Past Surgical History:  Procedure Laterality Date   A/V FISTULAGRAM Right 07/05/2020   Procedure: A/V Fistulagram;  Surgeon: Harvey Carlin BRAVO, MD;  Location: [facility];  Service: Cardiovascular;  Laterality: Right;   AV FISTULA PLACEMENT Left 11/22/2017   Procedure:  ARTERIOVENOUS (AV) FISTULA CREATION LEFT ARM;  Surgeon: Harvey Carlin BRAVO, MD;  Location: [facility] OR;  Service: Vascular;  Laterality: Left;   AV FISTULA PLACEMENT Right 04/04/2020   Procedure: RIGHT ARM ARTERIOVENOUS FISTULA CREATION;  Surgeon: Oris Krystal FALCON, MD;  Location: [facility];  Service: Vascular;  Laterality: Right;   AV FISTULA PLACEMENT Right 08/20/2020   Procedure: ARTERIOVENOUS (AV) FISTULA LIGATION RIGHT ARM;  Surgeon: Harvey Carlin BRAVO, MD;  Location: [facility] OR;  Service: Vascular;  Laterality: Right;   BIOPSY  08/07/2016   Procedure: BIOPSY;  Surgeon: Shaaron [name], MD;  Location: [facility];  Service: Endoscopy;;  gastric ulcer biopsy   COLONOSCOPY     ESOPHAGOGASTRODUODENOSCOPY N/A 08/07/2016   LA Grade A esophagitis s/p dilation, small hiatal hernia, multiple gastric ulcers and erosions, duodenal erosions s/p biopsy. Negative H.pylori    ESOPHAGOGASTRODUODENOSCOPY N/A 11/27/2016   normal esophagus, previously noted gastric ulcers completely healed, normal duodenum.    ESOPHAGOGASTRODUODENOSCOPY (EGD) WITH PROPOFOL  N/A 07/23/2020   Procedure: ESOPHAGOGASTRODUODENOSCOPY (EGD) WITH PROPOFOL ;  Surgeon: Golda Claudis PENNER, MD;  [facility]: [facility];  Service: Endoscopy;  Laterality: N/A;   FISTULA SUPERFICIALIZATION Left 02/14/2018   Procedure: FISTULA SUPERFICIALIZATION LEFT ARM;  Surgeon: Eliza Lonni RAMAN, MD;  Location: [facility] OR;  Service: Vascular;  Laterality: Left;   FRACTURE SURGERY Right    ankle   HOT HEMOSTASIS  07/23/2020   Procedure: HOT HEMOSTASIS (ARGON PLASMA COAGULATION/BICAP);  Surgeon: Golda Claudis PENNER, MD;  [facility]: [facility];  Service: Endoscopy;;   INTRAMEDULLARY (IM) NAIL INTERTROCHANTERIC Right 07/12/2020   Procedure: INTRAMEDULLARY (IM) NAIL INTERTROCHANTRIC;  Surgeon: Onesimo Oneil LABOR, MD;  Location: [facility];  Service: Orthopedics;  Laterality: Right;   IR FLUORO GUIDE CV LINE LEFT  12/21/2022   IR US  GUIDE VASC ACCESS LEFT  12/21/2022   MALONEY DILATION  N/A 08/07/2016   Procedure: AGAPITO DILATION;  Surgeon: Shaaron [name], MD;  [facility];  Service: Endoscopy;  Laterality: N/A;   MASTECTOMY, PARTIAL Right    PERIPHERAL VASCULAR BALLOON ANGIOPLASTY Left 07/13/2019   Procedure: PERIPHERAL VASCULAR BALLOON ANGIOPLASTY;  Surgeon: Gretta Lonni PARAS, MD;  [facility]: [facility];  Service: Cardiovascular;  Laterality: Left;  arm fistulogram   PERIPHERAL VASCULAR BALLOON ANGIOPLASTY Right 05/22/2020   Procedure: PERIPHERAL VASCULAR BALLOON ANGIOPLASTY;  Surgeon: Magda Debby SAILOR, MD;  Location: [facility];  Service: Cardiovascular;  Laterality: Right;  arm fistula   PERIPHERAL VASCULAR BALLOON ANGIOPLASTY Right 07/05/2020   Procedure: PERIPHERAL VASCULAR BALLOON ANGIOPLASTY;  Surgeon: Harvey Carlin BRAVO, MD;  Location: [facility];  Service: Cardiovascular;  Laterality: Right;  arm fistula   PORT-A-CATH REMOVAL Left 11/22/2017   Procedure: REMOVAL PORT-A-CATH LEFT CHEST;  Surgeon: Harvey Carlin BRAVO, MD;  Location: [facility] OR;  Service: Vascular;  Laterality: Left;   RETINAL DETACHMENT SURGERY Right    SCLEROTHERAPY  07/23/2020   Procedure: SCLEROTHERAPY;  Surgeon: Golda Claudis PENNER, MD;  [facility]: [facility];  Service: Endoscopy;;   UPPER EXTREMITY VENOGRAPHY N/A 08/22/2020   Procedure: LEFT UPPER & CENTRAL VENOGRAPHY;  Surgeon: Gretta Lonni PARAS, MD;  Location: [facility];  Service: Cardiovascular;  Laterality: N/A;    Social History   Socioeconomic History   Marital status: Single    Spouse name: Not on file   Number of children: Not on file   Years of education: Not on file   Highest education level: Not on file  Occupational History   Occupation: retired   Tobacco Use   Smoking status: Never   Smokeless tobacco: Never  Vaping Use   Vaping status: Never Used  Substance and Sexual Activity   Alcohol  use: No    Alcohol /week: 0.0 standard drinks of alcohol    Drug use: No   Sexual activity:  Never  Other Topics Concern   Not on file  Social History Narrative   Long term resident of SNF    Social Drivers of Health   Financial Resource Strain: Low Risk  (01/09/2020)   Overall Financial Resource Strain (CARDIA)    Difficulty of Paying Living Expenses: Not very hard  Food Insecurity: No Food Insecurity (12/16/2022)   Hunger Vital Sign    Worried About Running Out of Food in the Last Year: Never true    Ran Out of Food in the Last Year: Never true  Transportation Needs: No Transportation Needs (12/16/2022)   PRAPARE - Administrator, Civil Service (Medical): No    Lack of Transportation (Non-Medical): No  Physical Activity: Inactive (01/09/2020)   Exercise Vital Sign    Days of Exercise per Week: 0 days    Minutes of Exercise per Session: 0 min  Stress: No Stress Concern Present (01/09/2020)   [employer] of Occupational Health - Occupational Stress  Questionnaire    Feeling of Stress : Not at all  Social Connections: Moderately Isolated (01/09/2020)   Social Connection and Isolation Panel [NHANES]    Frequency of Communication with Friends and Family: More than three times a week    Frequency of Social Gatherings with Friends and Family: Once a week    Attends Religious Services: More than 4 times per year    Active Member of [employer] or Organizations: No    Attends Banker Meetings: Never    Marital Status: Never married  Intimate Partner Violence: Not At Risk (12/16/2022)   Humiliation, Afraid, Rape, and Kick questionnaire    Fear of Current or Ex-Partner: No    Emotionally Abused: No    Physically Abused: No    Sexually Abused: No   Family History  Problem Relation Age of Onset   Multiple myeloma Sister    Brain cancer Sister    Dementia Mother        died at 58   Stroke Mother    Heart failure Mother    Diabetes Mother    Heart disease Father    Prostate cancer Brother    Colon cancer Neg Hx       VITAL SIGNS BP (!) 197/97    Pulse 87   Temp 99.1 F (37.3 C)   Resp 18   Ht 5' 3 (1.6 m)   Wt 142 lb 9.6 oz (64.7 kg)   SpO2 96%   BMI 25.26 kg/m   Outpatient Encounter Medications as of 03/17/2023  Medication Sig   acetaminophen  (TYLENOL ) 325 MG tablet Take 650 mg by mouth 3 (three) times daily.   acyclovir  (ZOVIRAX ) 200 MG capsule Take 200 mg by mouth in the morning. (0800)   anastrozole  (ARIMIDEX ) 1 MG tablet TAKE 1 TABLET BY MOUTH DAILY   Calcium  Carb-Cholecalciferol  (CALCIUM  + VITAMIN D3) 500-5 MG-MCG TABS Take 1 tablet by mouth daily.   melatonin 5 MG TABS Take 5 mg by mouth at bedtime.   multivitamin (RENA-VIT) TABS tablet Take 1 tablet by mouth at bedtime.   Nutritional Supplements (ENSURE ENLIVE PO) Take 237 mLs by mouth every evening.   ondansetron  (ZOFRAN -ODT) 4 MG disintegrating tablet Take 4 mg by mouth daily before breakfast.   oseltamivir (TAMIFLU) 30 MG capsule Take 30 mg by mouth 2 (two) times daily.   pantoprazole  (PROTONIX ) 40 MG tablet Take 40 mg by mouth 2 (two) times daily.   sennosides-docusate sodium  (SENOKOT-S) 8.6-50 MG tablet Take 1 tablet by mouth 2 (two) times daily.   sertraline  (ZOLOFT ) 25 MG tablet Take 25 mg by mouth daily.   sevelamer  carbonate (RENVELA ) 800 MG tablet Take by mouth.   sucralfate  (CARAFATE ) 1 GM/10ML suspension Take 1 g by mouth in the morning, at noon, in the evening, and at bedtime.   Facility-Administered Encounter Medications as of 03/17/2023  Medication   lanreotide acetate  (SOMATULINE DEPOT ) 120 MG/0.5ML injection   lanreotide acetate  (SOMATULINE DEPOT ) 120 MG/0.5ML injection   lanreotide acetate  (SOMATULINE DEPOT ) 120 MG/0.5ML injection   lanreotide acetate  (SOMATULINE DEPOT ) 120 MG/0.5ML injection   octreotide  (SANDOSTATIN  LAR) 30 MG IM injection   octreotide  (SANDOSTATIN  LAR) 30 MG IM injection     SIGNIFICANT DIAGNOSTIC EXAMS  PREVIOUS  10-24-21: DEXA: t score -2.957  NO NEW EXAMS    LABS REVIEWED PREVIOUS      03-31-22: wbc 4.7; hgb 10.0; hct  32.2; mcv 96.4 plt 278; glucose 155; bun 28; creat 6.50; k+ 4.0; na++ 132; ca  8.0; gfr 6 protein 6.9 albumin  3.6 05-19-22: wbc 4.1; hgb 9.6; hct 30.9; mcv 98.7 plt 236; glucose 170; bun 33; creat 6.03; k+ 3.9; na++ 137; ca 8.1; gfr 6; protein 6.6 albumin  3.4; mag 2.1  06-23-22: wbc 4.9; hgb 9.1; hct 28.7 mcv 101.4 plt 286; glucose 158; bun 25; creat 5.85; k+ 3.9 na++ 136; ca 8.6 gfr 7 protein 6.8 albumin  3.3 mag 2.2 07-02-22: hgb A1c 5.1 07-07-22: wbc 4.9; hgb 10.3; hct 32.8; mcv 102.5 plt 265; glucose 141; bun 32; creat 6.15; k+ 4.3; na++ 136; ca 8.9; gfr 6 protein 7.2 albumin  3.7; mag 2.3 07-20-22: vitamin B 12: 660; tsh 1.836 08-04-22: wbc 4.6; hgb 10.7; ht 33.1; mcv 100.3 plt 234; glucose 120; bun 41; creat 6.46; k+ 4.2; na++ 135; ca 9.8; gfr 6; protein 7.3; albumin  3.7 mag 2.4 09-15-22: wbc 5.2; hgb 9.9; hct 31.0; mcv 103.3 plt 224; glucose 132; bun 22; creat 6.19; k+ 4.2; na++ 135; ca 9.5 gfr 6 protein 6.9 albumin  3.7     11-10-22: wbc 4.0; hgb 10.7; hct 33.4; mcv 102.1 plt 187; glucose 110; bun 28; creat 6.16; k+ 4.8; na++ 130; ca 9.0; gfr 6; protein 6.8 albumin  3.6 mag 2.4  12-16-22: wbc 6.7; hgb 12.4; hct 38.3; mcv 101.3 plt 201; glucose 123; bun 22; creat 4.81; k+ 4.1; na++ 133; ca 9.4; gfr 8; protein 7.9; albumin  4.1; blood culture: staphylococcus hominis  12-22-22: wbc 4.2; hgb 9.8; hct 30.1; mcv 100.0; plt 184; glucose 90; bun 81; creat 12.02; k+ 5.0; na++ 136; ca 8.8; gfr 3; phos 10.8; albumin  3.2  TODAY  02-16-23: wbc 4.9; hgb 9.2; hct 28.8; mcv 104.7 plt 215 glucose 194; bun 32; creat 8.17; k+ 5.1; na++ 9.8;gfr 4; protein 7.5 albumin  3.9 mag 2.6 03-16-23: wbc 4.6; hgb 9.5; hct 30.4; mcv 106.7 plt 201; glucose 103; bun 27; creat 6.30; k+ 4.7; na++ 136; ca 9.4 gfr 6; mag 2.4   Review of Systems  Constitutional:  Negative for malaise/fatigue.  Respiratory:  Negative for cough and shortness of breath.   Cardiovascular:  Negative for chest pain, palpitations and leg swelling.       Chest pain has  resolved   Gastrointestinal:  Negative for abdominal pain, constipation and heartburn.  Musculoskeletal:  Negative for back pain, joint pain and myalgias.  Skin: Negative.   Neurological:  Negative for dizziness.  Psychiatric/Behavioral:  The patient is not nervous/anxious.    Physical Exam Constitutional:      General: She is not in acute distress.    Appearance: She is well-developed. She is not diaphoretic.  Neck:     Thyroid : No thyromegaly.  Cardiovascular:     Rate and Rhythm: Normal rate and regular rhythm.     Pulses: Normal pulses.     Heart sounds: Normal heart sounds.  Pulmonary:     Effort: Pulmonary effort is normal. No respiratory distress.     Breath sounds: Normal breath sounds.  Abdominal:     General: Bowel sounds are normal. There is no distension.     Palpations: Abdomen is soft.     Tenderness: There is no abdominal tenderness.  Musculoskeletal:        General: Normal range of motion.     Cervical back: Neck supple.     Right lower leg: No edema.     Left lower leg: No edema.  Lymphadenopathy:     Cervical: No cervical adenopathy.  Skin:    General: Skin is warm and dry.  Comments: Tunneled dialysis access   chest wall   Neurological:     Mental Status: She is alert. Mental status is at baseline.  Psychiatric:        Mood and Affect: Mood normal.     ASSESSMENT/ PLAN:  TODAY  Aortic atherosclerosis (ct 10-04-18)  Dependence on hemodialysis   At this time will not make any changes will continue to monitor her status. Will await outpatient cardiology    Barnie Seip NP [facility]  call [phone]

## 2023-03-19 DIAGNOSIS — N186 End stage renal disease: Secondary | ICD-10-CM | POA: Diagnosis not present

## 2023-03-19 DIAGNOSIS — Z992 Dependence on renal dialysis: Secondary | ICD-10-CM | POA: Diagnosis not present

## 2023-03-22 DIAGNOSIS — N186 End stage renal disease: Secondary | ICD-10-CM | POA: Diagnosis not present

## 2023-03-22 DIAGNOSIS — Z992 Dependence on renal dialysis: Secondary | ICD-10-CM | POA: Diagnosis not present

## 2023-03-24 ENCOUNTER — Other Ambulatory Visit: Payer: Self-pay

## 2023-03-24 DIAGNOSIS — N186 End stage renal disease: Secondary | ICD-10-CM | POA: Diagnosis not present

## 2023-03-24 DIAGNOSIS — Z992 Dependence on renal dialysis: Secondary | ICD-10-CM | POA: Diagnosis not present

## 2023-03-26 DIAGNOSIS — Z992 Dependence on renal dialysis: Secondary | ICD-10-CM | POA: Diagnosis not present

## 2023-03-26 DIAGNOSIS — N186 End stage renal disease: Secondary | ICD-10-CM | POA: Diagnosis not present

## 2023-03-29 ENCOUNTER — Encounter: Payer: Self-pay | Admitting: Adult Health

## 2023-03-29 ENCOUNTER — Non-Acute Institutional Stay (SKILLED_NURSING_FACILITY): Payer: Self-pay | Admitting: Adult Health

## 2023-03-29 DIAGNOSIS — F339 Major depressive disorder, recurrent, unspecified: Secondary | ICD-10-CM | POA: Diagnosis not present

## 2023-03-29 DIAGNOSIS — Z992 Dependence on renal dialysis: Secondary | ICD-10-CM | POA: Diagnosis not present

## 2023-03-29 DIAGNOSIS — Z170 Estrogen receptor positive status [ER+]: Secondary | ICD-10-CM | POA: Diagnosis not present

## 2023-03-29 DIAGNOSIS — B009 Herpesviral infection, unspecified: Secondary | ICD-10-CM | POA: Diagnosis not present

## 2023-03-29 DIAGNOSIS — C9000 Multiple myeloma not having achieved remission: Secondary | ICD-10-CM | POA: Diagnosis not present

## 2023-03-29 DIAGNOSIS — C50111 Malignant neoplasm of central portion of right female breast: Secondary | ICD-10-CM | POA: Diagnosis not present

## 2023-03-29 DIAGNOSIS — N186 End stage renal disease: Secondary | ICD-10-CM | POA: Diagnosis not present

## 2023-03-29 NOTE — Progress Notes (Signed)
Location:  [facility] Room Number: 146 Place of Service:  SNF (31)   CODE STATUS: dnr   Allergies  Allergen Reactions   Ace Inhibitors Other (See Comments) and Cough    Tongue swelling Angioedema    Angiotensin Receptor Blockers Other (See Comments)    Angioedema with ACE-I   Penicillins Other (See Comments)    Unknown reaction    Chief Complaint  Patient presents with   Medical Management of Chronic Issues           Stage 1 infiltrating ductal carcinoma of female right breast    Multiple myeloma not having achieved remission:   Herpes:    Major depression chronic recurrent:     HPI:  She is a 88 year old long term resident of this facility being seen for the management of her chronic illnesses: Stage 1 infiltrating ductal carcinoma of female right breast    Multiple myeloma not having achieved remission:   Herpes:    Major depression chronic recurrent. There are no reports of uncontrolled pain. She has gained a slight amount of weight. There are no reports of anxiety or depressive thoughts   Past Medical History:  Diagnosis Date   Anemia     chronic macrocytic anemia   Anxiety    Chronic kidney disease    Chronic renal disease, stage 4, severely decreased glomerular filtration rate (GFR) between 15-29 mL/min/1.73 square meter (HCC) 08/22/2015   Complication of anesthesia    delirious after Breast Surgery   Dementia (HCC)    mild   Depression    Diabetes mellitus with ESRD (end-stage renal disease) (HCC)    type II   Dysphagia    Dyspnea    with activity   GERD (gastroesophageal reflux disease)    Glaucoma    Hyperlipidemia    Hypertension    Multiple myeloma (HCC)    Pneumonia    Stage 1 infiltrating ductal carcinoma of right female breast (HCC) 08/21/2015   ER+ PR+ HER 2 neu + (3+) T1cN0     Past Surgical History:  Procedure Laterality Date   A/V FISTULAGRAM Right 07/05/2020   Procedure: A/V Fistulagram;  Surgeon: Sherren Kerns, MD;  [facility];  Service: Cardiovascular;  Laterality: Right;   AV FISTULA PLACEMENT Left 11/22/2017   Procedure: ARTERIOVENOUS (AV) FISTULA CREATION LEFT ARM;  Surgeon: Sherren Kerns, MD;  Location: [facility];  Service: Vascular;  Laterality: Left;   AV FISTULA PLACEMENT Right 04/04/2020   Procedure: RIGHT ARM ARTERIOVENOUS FISTULA CREATION;  Surgeon: Larina Earthly, MD;  Location: [facility] ORS;  Service: Vascular;  Laterality: Right;   AV FISTULA PLACEMENT Right 08/20/2020   Procedure: ARTERIOVENOUS (AV) FISTULA LIGATION RIGHT ARM;  Surgeon: Sherren Kerns, MD;  Location: [facility] OR;  Service: Vascular;  Laterality: Right;   BIOPSY  08/07/2016   Procedure: BIOPSY;  Surgeon: Corbin Ade, MD;  Location: [facility];  Service: Endoscopy;;  gastric ulcer biopsy   COLONOSCOPY     ESOPHAGOGASTRODUODENOSCOPY N/A 08/07/2016   LA Grade A esophagitis s/p dilation, small hiatal hernia, multiple gastric ulcers and erosions, duodenal erosions s/p biopsy. Negative H.pylori    ESOPHAGOGASTRODUODENOSCOPY N/A 11/27/2016   normal esophagus, previously noted gastric ulcers completely healed, normal duodenum.    ESOPHAGOGASTRODUODENOSCOPY (EGD) WITH PROPOFOL N/A 07/23/2020   Procedure: ESOPHAGOGASTRODUODENOSCOPY (EGD) WITH PROPOFOL;  Surgeon: Malissa Hippo, MD;  [facility];  Service: Endoscopy;  Laterality: N/A;  FISTULA SUPERFICIALIZATION Left 02/14/2018   Procedure: FISTULA SUPERFICIALIZATION LEFT ARM;  Surgeon: Chuck Hint, MD;  Location: [facility] OR;  Service: Vascular;  Laterality: Left;   FRACTURE SURGERY Right    ankle   HOT HEMOSTASIS  07/23/2020   Procedure: HOT HEMOSTASIS (ARGON PLASMA COAGULATION/BICAP);  Surgeon: Malissa Hippo, MD;  [facility];  Service: Endoscopy;;   INTRAMEDULLARY (IM) NAIL INTERTROCHANTERIC Right 07/12/2020   Procedure: INTRAMEDULLARY (IM) NAIL INTERTROCHANTRIC;  Surgeon: Oliver Barre, MD;  Location: [facility];  Service:  Orthopedics;  Laterality: Right;   IR FLUORO GUIDE CV LINE LEFT  12/21/2022   IR US GUIDE VASC ACCESS LEFT  12/21/2022   MALONEY DILATION N/A 08/07/2016   Procedure: Elease Hashimoto DILATION;  Surgeon: Corbin Ade, MD;  [facility];  Service: Endoscopy;  Laterality: N/A;   MASTECTOMY, PARTIAL Right    PERIPHERAL VASCULAR BALLOON ANGIOPLASTY Left 07/13/2019   Procedure: PERIPHERAL VASCULAR BALLOON ANGIOPLASTY;  Surgeon: Cephus Shelling, MD;  [facility];  Service: Cardiovascular;  Laterality: Left;  arm fistulogram   PERIPHERAL VASCULAR BALLOON ANGIOPLASTY Right 05/22/2020   Procedure: PERIPHERAL VASCULAR BALLOON ANGIOPLASTY;  Surgeon: Leonie Douglas, MD;  [facility];  Service: Cardiovascular;  Laterality: Right;  arm fistula   PERIPHERAL VASCULAR BALLOON ANGIOPLASTY Right 07/05/2020   Procedure: PERIPHERAL VASCULAR BALLOON ANGIOPLASTY;  Surgeon: Sherren Kerns, MD;  [facility];  Service: Cardiovascular;  Laterality: Right;  arm fistula   PORT-A-CATH REMOVAL Left 11/22/2017   Procedure: REMOVAL PORT-A-CATH LEFT CHEST;  Surgeon: Sherren Kerns, MD;  Location: [facility] OR;  Service: Vascular;  Laterality: Left;   RETINAL DETACHMENT SURGERY Right    SCLEROTHERAPY  07/23/2020   Procedure: SCLEROTHERAPY;  Surgeon: Malissa Hippo, MD;  [facility];  Service: Endoscopy;;   UPPER EXTREMITY VENOGRAPHY N/A 08/22/2020   Procedure: LEFT UPPER & CENTRAL VENOGRAPHY;  Surgeon: Cephus Shelling, MD;  [facility];  Service: Cardiovascular;  Laterality: N/A;    Social History   Socioeconomic History   Marital status: Single    Spouse name: Not on file   Number of children: Not on file   Years of education: Not on file   Highest education level: Not on file  Occupational History   Occupation: retired   Tobacco Use   Smoking status: Never   Smokeless tobacco: Never  Vaping Use   Vaping status: Never  Used  Substance and Sexual Activity   Alcohol use: No    Alcohol/week: 0.0 standard drinks of alcohol   Drug use: No   Sexual activity: Never  Other Topics Concern   Not on file  Social History Narrative   Long term resident of SNF    Social Drivers of Health   Financial Resource Strain: Low Risk  (01/09/2020)   Overall Financial Resource Strain (CARDIA)    Difficulty of Paying Living Expenses: Not very hard  Food Insecurity: No Food Insecurity (12/16/2022)   Hunger Vital Sign    Worried About Running Out of Food in the Last Year: Never true    Ran Out of Food in the Last Year: Never true  Transportation Needs: No Transportation Needs (12/16/2022)   PRAPARE - Administrator, Civil Service (Medical): No    Lack of Transportation (Non-Medical): No  Physical Activity: Inactive (01/09/2020)   Exercise Vital Sign    Days of Exercise per Week: 0 days  Minutes of Exercise per Session: 0 min  Stress: No Stress Concern Present (01/09/2020)   [employer] of Occupational Health - Occupational Stress Questionnaire    Feeling of Stress : Not at all  Social Connections: Moderately Isolated (01/09/2020)   Social Connection and Isolation Panel [NHANES]    Frequency of Communication with Friends and Family: More than three times a week    Frequency of Social Gatherings with Friends and Family: Once a week    Attends Religious Services: More than 4 times per year    Active Member of [employer] or Organizations: No    Attends Banker Meetings: Never    Marital Status: Never married  Intimate Partner Violence: Not At Risk (12/16/2022)   Humiliation, Afraid, Rape, and Kick questionnaire    Fear of Current or Ex-Partner: No    Emotionally Abused: No    Physically Abused: No    Sexually Abused: No   Family History  Problem Relation Age of Onset   Multiple myeloma Sister    Brain cancer Sister    Dementia Mother        died at 62   Stroke Mother    Heart failure  Mother    Diabetes Mother    Heart disease Father    Prostate cancer Brother    Colon cancer Neg Hx       VITAL SIGNS BP 138/82   Pulse 70   Temp 98.1 F (36.7 C)   Resp 18   Ht 5\' 3"  (1.6 m)   Wt 142 lb 9.6 oz (64.7 kg)   SpO2 96%   BMI 25.26 kg/m   Outpatient Encounter Medications as of 03/29/2023  Medication Sig   acetaminophen (TYLENOL) 325 MG tablet Take 650 mg by mouth 3 (three) times daily.   acyclovir (ZOVIRAX) 200 MG capsule Take 200 mg by mouth in the morning. (0800)   anastrozole (ARIMIDEX) 1 MG tablet TAKE 1 TABLET BY MOUTH DAILY   Calcium Carb-Cholecalciferol (CALCIUM + VITAMIN D3) 500-5 MG-MCG TABS Take 1 tablet by mouth daily.   melatonin 5 MG TABS Take 5 mg by mouth at bedtime.   multivitamin (RENA-VIT) TABS tablet Take 1 tablet by mouth at bedtime.   Nutritional Supplements (ENSURE ENLIVE PO) Take 237 mLs by mouth every evening.   ondansetron (ZOFRAN-ODT) 4 MG disintegrating tablet Take 4 mg by mouth daily before breakfast.   pantoprazole (PROTONIX) 40 MG tablet Take 40 mg by mouth 2 (two) times daily.   sennosides-docusate sodium (SENOKOT-S) 8.6-50 MG tablet Take 1 tablet by mouth 2 (two) times daily.   sertraline (ZOLOFT) 25 MG tablet Take 25 mg by mouth daily.   sevelamer carbonate (RENVELA) 800 MG tablet Take 800 mg by mouth daily.   sucralfate (CARAFATE) 1 GM/10ML suspension Take 1 g by mouth in the morning, at noon, in the evening, and at bedtime.   [DISCONTINUED] oseltamivir (TAMIFLU) 30 MG capsule Take 30 mg by mouth 2 (two) times daily.   Facility-Administered Encounter Medications as of 03/29/2023  Medication   lanreotide acetate (SOMATULINE DEPOT) 120 MG/0.5ML injection   lanreotide acetate (SOMATULINE DEPOT) 120 MG/0.5ML injection   lanreotide acetate (SOMATULINE DEPOT) 120 MG/0.5ML injection   lanreotide acetate (SOMATULINE DEPOT) 120 MG/0.5ML injection   octreotide (SANDOSTATIN LAR) 30 MG IM injection   octreotide (SANDOSTATIN LAR) 30 MG IM  injection     SIGNIFICANT DIAGNOSTIC EXAMS   PREVIOUS  10-24-21: DEXA: t score -2.957  NO NEW EXAMS    LABS REVIEWED  PREVIOUS     03-31-22: wbc 4.7; hgb 10.0; hct 32.2; mcv 96.4 plt 278; glucose 155; bun 28; creat 6.50; k+ 4.0; na++ 132; ca 8.0; gfr 6 protein 6.9 albumin 3.6 05-19-22: wbc 4.1; hgb 9.6; hct 30.9; mcv 98.7 plt 236; glucose 170; bun 33; creat 6.03; k+ 3.9; na++ 137; ca 8.1; gfr 6; protein 6.6 albumin 3.4; mag 2.1  06-23-22: wbc 4.9; hgb 9.1; hct 28.7 mcv 101.4 plt 286; glucose 158; bun 25; creat 5.85; k+ 3.9 na++ 136; ca 8.6 gfr 7 protein 6.8 albumin 3.3 mag 2.2 07-02-22: hgb A1c 5.1 07-07-22: wbc 4.9; hgb 10.3; hct 32.8; mcv 102.5 plt 265; glucose 141; bun 32; creat 6.15; k+ 4.3; na++ 136; ca 8.9; gfr 6 protein 7.2 albumin 3.7; mag 2.3 07-20-22: vitamin B 12: 660; tsh 1.836 08-04-22: wbc 4.6; hgb 10.7; ht 33.1; mcv 100.3 plt 234; glucose 120; bun 41; creat 6.46; k+ 4.2; na++ 135; ca 9.8; gfr 6; protein 7.3; albumin 3.7 mag 2.4 09-15-22: wbc 5.2; hgb 9.9; hct 31.0; mcv 103.3 plt 224; glucose 132; bun 22; creat 6.19; k+ 4.2; na++ 135; ca 9.5 gfr 6 protein 6.9 albumin 3.7     11-10-22: wbc 4.0; hgb 10.7; hct 33.4; mcv 102.1 plt 187; glucose 110; bun 28; creat 6.16; k+ 4.8; na++ 130; ca 9.0; gfr 6; protein 6.8 albumin 3.6 mag 2.4  12-16-22: wbc 6.7; hgb 12.4; hct 38.3; mcv 101.3 plt 201; glucose 123; bun 22; creat 4.81; k+ 4.1; na++ 133; ca 9.4; gfr 8; protein 7.9; albumin 4.1; blood culture: staphylococcus hominis  12-22-22: wbc 4.2; hgb 9.8; hct 30.1; mcv 100.0; plt 184; glucose 90; bun 81; creat 12.02; k+ 5.0; na++ 136; ca 8.8; gfr 3; phos 10.8; albumin 3.2  TODAY  02-16-23: wbc 4.9; hgb 9.2; hct 28.8; mcv 104.7 plt 215 glucose 194; bun 32; creat 8.17; k+ 5.1; na++ 9.8;gfr 4; protein 7.5 albumin 3.9 mag 2.6    Review of Systems  Constitutional:  Negative for malaise/fatigue.  Respiratory:  Negative for cough and shortness of breath.   Cardiovascular:  Negative for chest pain,  palpitations and leg swelling.  Gastrointestinal:  Negative for abdominal pain, constipation and heartburn.  Musculoskeletal:  Negative for back pain, joint pain and myalgias.  Skin: Negative.   Neurological:  Negative for dizziness.  Psychiatric/Behavioral:  The patient is not nervous/anxious.    Physical Exam Constitutional:      General: She is not in acute distress.    Appearance: She is well-developed. She is not diaphoretic.  Neck:     Thyroid: No thyromegaly.  Cardiovascular:     Rate and Rhythm: Normal rate and regular rhythm.     Pulses: Normal pulses.     Heart sounds: Normal heart sounds.  Pulmonary:     Effort: Pulmonary effort is normal. No respiratory distress.     Breath sounds: Normal breath sounds.  Abdominal:     General: Bowel sounds are normal. There is no distension.     Palpations: Abdomen is soft.     Tenderness: There is no abdominal tenderness.  Musculoskeletal:        General: Normal range of motion.     Cervical back: Neck supple.     Right lower leg: No edema.     Left lower leg: No edema.  Lymphadenopathy:     Cervical: No cervical adenopathy.  Skin:    General: Skin is warm and dry.     Comments: Tunneled dialysis access   chest wall  Neurological:     Mental Status: She is alert. Mental status is at baseline.  Psychiatric:        Mood and Affect: Mood normal.          ASSESSMENT/PLAN  TODAY  Stage 1 infiltrating ductal carcinoma of female right breast / s/p right mastectomy will continue arimidex 1 mg daily   2. Multiple myeloma not having achieved remission: is followed by oncology  3. Herpes: without outbreak will continue acyclovir 200 mg daily   4. Major depression chronic recurrent: will continue zoloft 25 mg daily   PREVIOUS   5. Protein calorie malnutrition: protein 7.5 albumin 3.9 will continue supplements and prostat with meals.   6. Multiple gastric ulcers/duodenal ulcer/upper GI bleed/esophageal reflux disease  without esophagitis: will continue protonix 40 mg twice daily carafate 1 gm with before meals and bedtime. Zofran 4 mg daily and every 6 hours as needed    7. Generalized chronic pain: will continue tylenol 650 mg three times daily   8. Chronic kidney disease with end stage renal disease on dialysis due to type 2 diabetes mellitus/dependence on dialysis: will continue dialysis three days per week: 1200 cc fluid restriction; renvela 800 mg daily   9. Hypertension associated with end stage renal disease (ESRD) due to type 2 diabetes mellitus: b/p 138/82 will monitor   10. Hyperlipidemia associated with type 2 diabetes mellitus: ldl 89; is off lipitor; had elevated liver enzymes.   11. Aortic atherosclerosis/atherosclerotic peripheral vascular disease (ct 10-04-18) ldl 89  12. Vascular dementia with behavioral disturbance: weight is 142 pounds and stable.   13. Diabetes mellitus type 2 with end stage renal disease (ESRD) hgb A1c 5.1 is currently off medications  14. Anemia due to end stage renal disease; hgb 9.2  15. Post menopausal osteoporosis: t score -2.957 will continue supplements     Synthia Innocent NP [facility]  call [phone]

## 2023-03-30 ENCOUNTER — Inpatient Hospital Stay: Payer: Medicare PPO

## 2023-03-30 ENCOUNTER — Inpatient Hospital Stay: Payer: Medicare PPO | Admitting: Hematology

## 2023-03-30 ENCOUNTER — Ambulatory Visit: Payer: Medicare PPO

## 2023-03-30 ENCOUNTER — Other Ambulatory Visit: Payer: Medicare PPO

## 2023-03-30 VITALS — BP 144/76 | HR 100 | Temp 98.3°F | Resp 20 | Wt 139.0 lb

## 2023-03-30 DIAGNOSIS — Z5112 Encounter for antineoplastic immunotherapy: Secondary | ICD-10-CM | POA: Diagnosis not present

## 2023-03-30 DIAGNOSIS — C9000 Multiple myeloma not having achieved remission: Secondary | ICD-10-CM | POA: Diagnosis not present

## 2023-03-30 LAB — CBC WITH DIFFERENTIAL/PLATELET
Abs Immature Granulocytes: 0.02 10*3/uL (ref 0.00–0.07)
Basophils Absolute: 0 10*3/uL (ref 0.0–0.1)
Basophils Relative: 1 %
Eosinophils Absolute: 0.2 10*3/uL (ref 0.0–0.5)
Eosinophils Relative: 6 %
HCT: 32.6 % — ABNORMAL LOW (ref 36.0–46.0)
Hemoglobin: 10.2 g/dL — ABNORMAL LOW (ref 12.0–15.0)
Immature Granulocytes: 1 %
Lymphocytes Relative: 23 %
Lymphs Abs: 0.9 10*3/uL (ref 0.7–4.0)
MCH: 33.6 pg (ref 26.0–34.0)
MCHC: 31.3 g/dL (ref 30.0–36.0)
MCV: 107.2 fL — ABNORMAL HIGH (ref 80.0–100.0)
Monocytes Absolute: 0.4 10*3/uL (ref 0.1–1.0)
Monocytes Relative: 10 %
Neutro Abs: 2.3 10*3/uL (ref 1.7–7.7)
Neutrophils Relative %: 59 %
Platelets: 239 10*3/uL (ref 150–400)
RBC: 3.04 MIL/uL — ABNORMAL LOW (ref 3.87–5.11)
RDW: 16.2 % — ABNORMAL HIGH (ref 11.5–15.5)
WBC: 3.9 10*3/uL — ABNORMAL LOW (ref 4.0–10.5)
nRBC: 0 % (ref 0.0–0.2)

## 2023-03-30 LAB — COMPREHENSIVE METABOLIC PANEL
ALT: 13 U/L (ref 0–44)
AST: 20 U/L (ref 15–41)
Albumin: 4 g/dL (ref 3.5–5.0)
Alkaline Phosphatase: 92 U/L (ref 38–126)
Anion gap: 15 (ref 5–15)
BUN: 27 mg/dL — ABNORMAL HIGH (ref 8–23)
CO2: 26 mmol/L (ref 22–32)
Calcium: 9.4 mg/dL (ref 8.9–10.3)
Chloride: 96 mmol/L — ABNORMAL LOW (ref 98–111)
Creatinine, Ser: 6.78 mg/dL — ABNORMAL HIGH (ref 0.44–1.00)
GFR, Estimated: 6 mL/min — ABNORMAL LOW (ref >60)
Glucose, Bld: 132 mg/dL — ABNORMAL HIGH (ref 70–99)
Potassium: 4.8 mmol/L (ref 3.5–5.1)
Sodium: 137 mmol/L (ref 135–145)
Total Bilirubin: 0.3 mg/dL (ref 0.0–1.2)
Total Protein: 7.5 g/dL (ref 6.5–8.1)

## 2023-03-30 LAB — MAGNESIUM: Magnesium: 2.5 mg/dL — ABNORMAL HIGH (ref 1.7–2.4)

## 2023-03-30 MED ORDER — PROCHLORPERAZINE MALEATE 10 MG PO TABS
10.0000 mg | ORAL_TABLET | Freq: Once | ORAL | Status: AC
Start: 2023-03-30 — End: 2023-03-30
  Administered 2023-03-30: 10 mg via ORAL
  Filled 2023-03-30: qty 1

## 2023-03-30 MED ORDER — BORTEZOMIB CHEMO SQ INJECTION 3.5 MG (2.5MG/ML)
1.2000 mg/m2 | Freq: Once | INTRAMUSCULAR | Status: AC
  Administered 2023-03-30: 2 mg via SUBCUTANEOUS
  Filled 2023-03-30: qty 0.8

## 2023-03-30 NOTE — Patient Instructions (Signed)
 [facility] [place] - A DEPT OF [facility]  Discharge Instructions: Thank you for choosing [facility] to provide your oncology and hematology care.  If you have a lab appointment with the Cancer Center - please note that after April 8th, 2024, all labs will be drawn in the cancer center.  You do not have to check in or register with the main entrance as you have in the past but will complete your check-in in the cancer center.  Wear comfortable clothing and clothing appropriate for easy access to any Portacath or PICC line.   We strive to give you quality time with your provider. You may need to reschedule your appointment if you arrive late (15 or more minutes).  Arriving late affects you and other patients whose appointments are after yours.  Also, if you miss three or more appointments without notifying the office, you may be dismissed from the clinic at the provider's discretion.      For prescription refill requests, have your pharmacy contact our office and allow 72 hours for refills to be completed.    Today you received the following chemotherapy and/or immunotherapy agents Velcade, return as scheduled.   To help prevent nausea and vomiting after your treatment, we encourage you to take your nausea medication as directed.  BELOW ARE SYMPTOMS THAT SHOULD BE REPORTED IMMEDIATELY: *FEVER GREATER THAN 100.4 F (38 C) OR HIGHER *CHILLS OR SWEATING *NAUSEA AND VOMITING THAT IS NOT CONTROLLED WITH YOUR NAUSEA MEDICATION *UNUSUAL SHORTNESS OF BREATH *UNUSUAL BRUISING OR BLEEDING *URINARY PROBLEMS (pain or burning when urinating, or frequent urination) *BOWEL PROBLEMS (unusual diarrhea, constipation, pain near the anus) TENDERNESS IN MOUTH AND THROAT WITH OR WITHOUT PRESENCE OF ULCERS (sore throat, sores in mouth, or a toothache) UNUSUAL RASH, SWELLING OR PAIN  UNUSUAL VAGINAL DISCHARGE OR ITCHING   Items with * indicate a potential emergency and  should be followed up as soon as possible or go to the Emergency Department if any problems should occur.  Please show the CHEMOTHERAPY ALERT CARD or IMMUNOTHERAPY ALERT CARD at check-in to the Emergency Department and triage nurse.  Should you have questions after your visit or need to cancel or reschedule your appointment, please contact [facility] [place] - A DEPT OF [facility] [phone]  and follow the prompts.  Office hours are 8:00 a.m. to 4:30 p.m. Monday - Friday. Please note that voicemails left after 4:00 p.m. may not be returned until the following business day.  We are closed weekends and major holidays. You have access to a nurse at all times for urgent questions. Please call the main number to the clinic [phone] and follow the prompts.  For any non-urgent questions, you may also contact your provider using MyChart. We now offer e-Visits for anyone 53 and older to request care online for non-urgent symptoms. For details visit [email or web address].   Also download the MyChart app! Go to the app store, search "MyChart", open the app, select [place], and log in with your MyChart username and password.

## 2023-03-30 NOTE — Progress Notes (Signed)
Patient presents today for Velcade, patient's labs within treatment parameters.Patient tolerated Velcade injection with no complaints voiced. Lab work reviewed. See MAR for details. Injection site clean and dry with no bruising or swelling noted. Patient stable during and after injection. Band aid applied. VSS. Patient left in satisfactory condition with no s/s of distress noted.

## 2023-03-31 DIAGNOSIS — R488 Other symbolic dysfunctions: Secondary | ICD-10-CM | POA: Diagnosis not present

## 2023-03-31 DIAGNOSIS — F01518 Vascular dementia, unspecified severity, with other behavioral disturbance: Secondary | ICD-10-CM | POA: Diagnosis not present

## 2023-03-31 DIAGNOSIS — N186 End stage renal disease: Secondary | ICD-10-CM | POA: Diagnosis not present

## 2023-03-31 DIAGNOSIS — R1312 Dysphagia, oropharyngeal phase: Secondary | ICD-10-CM | POA: Diagnosis not present

## 2023-03-31 DIAGNOSIS — Z992 Dependence on renal dialysis: Secondary | ICD-10-CM | POA: Diagnosis not present

## 2023-03-31 LAB — KAPPA/LAMBDA LIGHT CHAINS
Kappa free light chain: 232 mg/L — ABNORMAL HIGH (ref 3.3–19.4)
Kappa, lambda light chain ratio: 0.39 (ref 0.26–1.65)
Lambda free light chains: 599.8 mg/L — ABNORMAL HIGH (ref 5.7–26.3)

## 2023-04-01 ENCOUNTER — Other Ambulatory Visit (HOSPITAL_COMMUNITY)
Admission: RE | Admit: 2023-04-01 | Discharge: 2023-04-01 | Disposition: A | Discharge status: Home / Self Care | Payer: Medicare PPO | Source: Skilled Nursing Facility | Attending: Adult Health | Admitting: Adult Health

## 2023-04-01 DIAGNOSIS — R1312 Dysphagia, oropharyngeal phase: Secondary | ICD-10-CM | POA: Diagnosis not present

## 2023-04-01 DIAGNOSIS — E1169 Type 2 diabetes mellitus with other specified complication: Secondary | ICD-10-CM | POA: Diagnosis not present

## 2023-04-01 DIAGNOSIS — R488 Other symbolic dysfunctions: Secondary | ICD-10-CM | POA: Diagnosis not present

## 2023-04-01 DIAGNOSIS — F01518 Vascular dementia, unspecified severity, with other behavioral disturbance: Secondary | ICD-10-CM | POA: Diagnosis not present

## 2023-04-01 LAB — HEMOGLOBIN A1C
Hgb A1c MFr Bld: 4.9 % (ref 4.8–5.6)
Mean Plasma Glucose: 93.93 mg/dL

## 2023-04-02 DIAGNOSIS — R1312 Dysphagia, oropharyngeal phase: Secondary | ICD-10-CM | POA: Diagnosis not present

## 2023-04-02 DIAGNOSIS — R488 Other symbolic dysfunctions: Secondary | ICD-10-CM | POA: Diagnosis not present

## 2023-04-02 DIAGNOSIS — F01518 Vascular dementia, unspecified severity, with other behavioral disturbance: Secondary | ICD-10-CM | POA: Diagnosis not present

## 2023-04-02 DIAGNOSIS — Z992 Dependence on renal dialysis: Secondary | ICD-10-CM | POA: Diagnosis not present

## 2023-04-02 DIAGNOSIS — N186 End stage renal disease: Secondary | ICD-10-CM | POA: Diagnosis not present

## 2023-04-05 DIAGNOSIS — Z992 Dependence on renal dialysis: Secondary | ICD-10-CM | POA: Diagnosis not present

## 2023-04-05 DIAGNOSIS — F01518 Vascular dementia, unspecified severity, with other behavioral disturbance: Secondary | ICD-10-CM | POA: Diagnosis not present

## 2023-04-05 DIAGNOSIS — R1312 Dysphagia, oropharyngeal phase: Secondary | ICD-10-CM | POA: Diagnosis not present

## 2023-04-05 DIAGNOSIS — R488 Other symbolic dysfunctions: Secondary | ICD-10-CM | POA: Diagnosis not present

## 2023-04-05 DIAGNOSIS — N186 End stage renal disease: Secondary | ICD-10-CM | POA: Diagnosis not present

## 2023-04-05 LAB — IMMUNOFIXATION ELECTROPHORESIS
IgA: 464 mg/dL — ABNORMAL HIGH (ref 64–422)
IgG (Immunoglobin G), Serum: 895 mg/dL (ref 586–1602)
IgM (Immunoglobulin M), Srm: 15 mg/dL — ABNORMAL LOW (ref 26–217)
Total Protein ELP: 7 g/dL (ref 6.0–8.5)

## 2023-04-06 DIAGNOSIS — R1312 Dysphagia, oropharyngeal phase: Secondary | ICD-10-CM | POA: Diagnosis not present

## 2023-04-06 DIAGNOSIS — N186 End stage renal disease: Secondary | ICD-10-CM | POA: Diagnosis not present

## 2023-04-06 DIAGNOSIS — R488 Other symbolic dysfunctions: Secondary | ICD-10-CM | POA: Diagnosis not present

## 2023-04-06 DIAGNOSIS — F01518 Vascular dementia, unspecified severity, with other behavioral disturbance: Secondary | ICD-10-CM | POA: Diagnosis not present

## 2023-04-06 DIAGNOSIS — Z992 Dependence on renal dialysis: Secondary | ICD-10-CM | POA: Diagnosis not present

## 2023-04-07 DIAGNOSIS — Z992 Dependence on renal dialysis: Secondary | ICD-10-CM | POA: Diagnosis not present

## 2023-04-07 DIAGNOSIS — N186 End stage renal disease: Secondary | ICD-10-CM | POA: Diagnosis not present

## 2023-04-07 LAB — PROTEIN ELECTROPHORESIS, SERUM
A/G Ratio: 1.2 (ref 0.7–1.7)
Albumin ELP: 3.8 g/dL (ref 2.9–4.4)
Alpha-1-Globulin: 0.3 g/dL (ref 0.0–0.4)
Alpha-2-Globulin: 0.8 g/dL (ref 0.4–1.0)
Beta Globulin: 1.1 g/dL (ref 0.7–1.3)
Gamma Globulin: 0.9 g/dL (ref 0.4–1.8)
Globulin, Total: 3.1 g/dL (ref 2.2–3.9)
M-Spike, %: 0.4 g/dL — ABNORMAL HIGH
Total Protein ELP: 6.9 g/dL (ref 6.0–8.5)

## 2023-04-08 DIAGNOSIS — F01518 Vascular dementia, unspecified severity, with other behavioral disturbance: Secondary | ICD-10-CM | POA: Diagnosis not present

## 2023-04-08 DIAGNOSIS — R1312 Dysphagia, oropharyngeal phase: Secondary | ICD-10-CM | POA: Diagnosis not present

## 2023-04-08 DIAGNOSIS — R488 Other symbolic dysfunctions: Secondary | ICD-10-CM | POA: Diagnosis not present

## 2023-04-09 ENCOUNTER — Encounter: Payer: Self-pay | Admitting: Adult Health

## 2023-04-09 ENCOUNTER — Non-Acute Institutional Stay (SKILLED_NURSING_FACILITY): Payer: Medicare PPO | Admitting: Adult Health

## 2023-04-09 DIAGNOSIS — F01518 Vascular dementia, unspecified severity, with other behavioral disturbance: Secondary | ICD-10-CM | POA: Diagnosis not present

## 2023-04-09 DIAGNOSIS — C9000 Multiple myeloma not having achieved remission: Secondary | ICD-10-CM

## 2023-04-09 DIAGNOSIS — Z992 Dependence on renal dialysis: Secondary | ICD-10-CM | POA: Diagnosis not present

## 2023-04-09 DIAGNOSIS — N186 End stage renal disease: Secondary | ICD-10-CM

## 2023-04-09 DIAGNOSIS — I700 Atherosclerosis of aorta: Secondary | ICD-10-CM | POA: Diagnosis not present

## 2023-04-09 DIAGNOSIS — R488 Other symbolic dysfunctions: Secondary | ICD-10-CM | POA: Diagnosis not present

## 2023-04-09 DIAGNOSIS — R1312 Dysphagia, oropharyngeal phase: Secondary | ICD-10-CM | POA: Diagnosis not present

## 2023-04-09 NOTE — Progress Notes (Signed)
Location:  [facility] Room Number: 145 Place of Service:  SNF (31)   CODE STATUS: dnr   Allergies  Allergen Reactions   Ace Inhibitors Other (See Comments) and Cough    Tongue swelling Angioedema    Angiotensin Receptor Blockers Other (See Comments)    Angioedema with ACE-I   Penicillins Other (See Comments)    Unknown reaction    Chief Complaint  Patient presents with   Acute Visit    Care plan meeting     HPI:  We have come together for her care plan meeting. Family present.  BIMS 12/15 mood 2/30: decreased energy. Her showers were changed to due to different days  due to dialysis. She is wheelchair bound with no falls. She requires moderate to dependent assist with her adls. She is incontinent of bladder and bowel. Dietary: requires setup for meals; D3 diet: no dairy; 1200 cc fluid restriction; appetite 76-100%. Therapy: ST: for cognition and eating. She will continue to be followed for her chronic illnesses including:  ESRD on hemodialysis   Dependence on hemodialysis  Multiple myeloma not having achieved remission  Aortic atherosclerosis  Past Medical History:  Diagnosis Date   Anemia     chronic macrocytic anemia   Anxiety    Chronic kidney disease    Chronic renal disease, stage 4, severely decreased glomerular filtration rate (GFR) between 15-29 mL/min/1.73 square meter (HCC) 08/22/2015   Complication of anesthesia    delirious after Breast Surgery   Dementia (HCC)    mild   Depression    Diabetes mellitus with ESRD (end-stage renal disease) (HCC)    type II   Dysphagia    Dyspnea    with activity   GERD (gastroesophageal reflux disease)    Glaucoma    Hyperlipidemia    Hypertension    Multiple myeloma (HCC)    Pneumonia    Stage 1 infiltrating ductal carcinoma of right female breast (HCC) 08/21/2015   ER+ PR+ HER 2 neu + (3+) T1cN0     Past Surgical History:  Procedure Laterality Date   A/V FISTULAGRAM Right 07/05/2020    Procedure: A/V Fistulagram;  Surgeon: Sherren Kerns, MD;  [facility];  Service: Cardiovascular;  Laterality: Right;   AV FISTULA PLACEMENT Left 11/22/2017   Procedure: ARTERIOVENOUS (AV) FISTULA CREATION LEFT ARM;  Surgeon: Sherren Kerns, MD;  Location: [facility] OR;  Service: Vascular;  Laterality: Left;   AV FISTULA PLACEMENT Right 04/04/2020   Procedure: RIGHT ARM ARTERIOVENOUS FISTULA CREATION;  Surgeon: Larina Earthly, MD;  Location: [facility] ORS;  Service: Vascular;  Laterality: Right;   AV FISTULA PLACEMENT Right 08/20/2020   Procedure: ARTERIOVENOUS (AV) FISTULA LIGATION RIGHT ARM;  Surgeon: Sherren Kerns, MD;  Location: [facility] OR;  Service: Vascular;  Laterality: Right;   BIOPSY  08/07/2016   Procedure: BIOPSY;  Surgeon: Corbin Ade, MD;  Location: [facility];  Service: Endoscopy;;  gastric ulcer biopsy   COLONOSCOPY     ESOPHAGOGASTRODUODENOSCOPY N/A 08/07/2016   LA Grade A esophagitis s/p dilation, small hiatal hernia, multiple gastric ulcers and erosions, duodenal erosions s/p biopsy. Negative H.pylori    ESOPHAGOGASTRODUODENOSCOPY N/A 11/27/2016   normal esophagus, previously noted gastric ulcers completely healed, normal duodenum.    ESOPHAGOGASTRODUODENOSCOPY (EGD) WITH PROPOFOL N/A 07/23/2020   Procedure: ESOPHAGOGASTRODUODENOSCOPY (EGD) WITH PROPOFOL;  Surgeon: Malissa Hippo, MD;  [facility];  Service: Endoscopy;  Laterality: N/A;   FISTULA SUPERFICIALIZATION Left  02/14/2018   Procedure: FISTULA SUPERFICIALIZATION LEFT ARM;  Surgeon: Chuck Hint, MD;  Location: [facility] OR;  Service: Vascular;  Laterality: Left;   FRACTURE SURGERY Right    ankle   HOT HEMOSTASIS  07/23/2020   Procedure: HOT HEMOSTASIS (ARGON PLASMA COAGULATION/BICAP);  Surgeon: Malissa Hippo, MD;  [facility];  Service: Endoscopy;;   INTRAMEDULLARY (IM) NAIL INTERTROCHANTERIC Right 07/12/2020   Procedure: INTRAMEDULLARY (IM) NAIL INTERTROCHANTRIC;   Surgeon: Oliver Barre, MD;  Location: [facility];  Service: Orthopedics;  Laterality: Right;   IR FLUORO GUIDE CV LINE LEFT  12/21/2022   IR US GUIDE VASC ACCESS LEFT  12/21/2022   MALONEY DILATION N/A 08/07/2016   Procedure: Elease Hashimoto DILATION;  Surgeon: Corbin Ade, MD;  [facility];  Service: Endoscopy;  Laterality: N/A;   MASTECTOMY, PARTIAL Right    PERIPHERAL VASCULAR BALLOON ANGIOPLASTY Left 07/13/2019   Procedure: PERIPHERAL VASCULAR BALLOON ANGIOPLASTY;  Surgeon: Cephus Shelling, MD;  [facility];  Service: Cardiovascular;  Laterality: Left;  arm fistulogram   PERIPHERAL VASCULAR BALLOON ANGIOPLASTY Right 05/22/2020   Procedure: PERIPHERAL VASCULAR BALLOON ANGIOPLASTY;  Surgeon: Leonie Douglas, MD;  [facility];  Service: Cardiovascular;  Laterality: Right;  arm fistula   PERIPHERAL VASCULAR BALLOON ANGIOPLASTY Right 07/05/2020   Procedure: PERIPHERAL VASCULAR BALLOON ANGIOPLASTY;  Surgeon: Sherren Kerns, MD;  [facility];  Service: Cardiovascular;  Laterality: Right;  arm fistula   PORT-A-CATH REMOVAL Left 11/22/2017   Procedure: REMOVAL PORT-A-CATH LEFT CHEST;  Surgeon: Sherren Kerns, MD;  Location: [facility] OR;  Service: Vascular;  Laterality: Left;   RETINAL DETACHMENT SURGERY Right    SCLEROTHERAPY  07/23/2020   Procedure: SCLEROTHERAPY;  Surgeon: Malissa Hippo, MD;  [facility];  Service: Endoscopy;;   UPPER EXTREMITY VENOGRAPHY N/A 08/22/2020   Procedure: LEFT UPPER & CENTRAL VENOGRAPHY;  Surgeon: Cephus Shelling, MD;  [facility];  Service: Cardiovascular;  Laterality: N/A;    Social History   Socioeconomic History   Marital status: Single    Spouse name: Not on file   Number of children: Not on file   Years of education: Not on file   Highest education level: Not on file  Occupational History   Occupation: retired   Tobacco Use   Smoking status: Never    Smokeless tobacco: Never  Vaping Use   Vaping status: Never Used  Substance and Sexual Activity   Alcohol use: No    Alcohol/week: 0.0 standard drinks of alcohol   Drug use: No   Sexual activity: Never  Other Topics Concern   Not on file  Social History Narrative   Long term resident of SNF    Social Drivers of Health   Financial Resource Strain: Low Risk  (01/09/2020)   Overall Financial Resource Strain (CARDIA)    Difficulty of Paying Living Expenses: Not very hard  Food Insecurity: No Food Insecurity (12/16/2022)   Hunger Vital Sign    Worried About Running Out of Food in the Last Year: Never true    Ran Out of Food in the Last Year: Never true  Transportation Needs: No Transportation Needs (12/16/2022)   PRAPARE - Administrator, Civil Service (Medical): No    Lack of Transportation (Non-Medical): No  Physical Activity: Inactive (01/09/2020)   Exercise Vital Sign    Days of Exercise per Week: 0 days    Minutes of  Exercise per Session: 0 min  Stress: No Stress Concern Present (01/09/2020)   [employer] of Occupational Health - Occupational Stress Questionnaire    Feeling of Stress : Not at all  Social Connections: Moderately Isolated (01/09/2020)   Social Connection and Isolation Panel [NHANES]    Frequency of Communication with Friends and Family: More than three times a week    Frequency of Social Gatherings with Friends and Family: Once a week    Attends Religious Services: More than 4 times per year    Active Member of [employer] or Organizations: No    Attends Banker Meetings: Never    Marital Status: Never married  Intimate Partner Violence: Not At Risk (12/16/2022)   Humiliation, Afraid, Rape, and Kick questionnaire    Fear of Current or Ex-Partner: No    Emotionally Abused: No    Physically Abused: No    Sexually Abused: No   Family History  Problem Relation Age of Onset   Multiple myeloma Sister    Brain cancer Sister    Dementia  Mother        died at 36   Stroke Mother    Heart failure Mother    Diabetes Mother    Heart disease Father    Prostate cancer Brother    Colon cancer Neg Hx       VITAL SIGNS BP (!) 159/77   Pulse 65   Temp 97.8 F (36.6 C)   Resp 16   Ht 5\' 3"  (1.6 m)   Wt 142 lb 9.6 oz (64.7 kg)   SpO2 100%   BMI 25.26 kg/m   Outpatient Encounter Medications as of 04/09/2023  Medication Sig   acetaminophen (TYLENOL) 325 MG tablet Take 650 mg by mouth 3 (three) times daily.   acyclovir (ZOVIRAX) 200 MG capsule Take 200 mg by mouth in the morning. (0800)   anastrozole (ARIMIDEX) 1 MG tablet TAKE 1 TABLET BY MOUTH DAILY   Calcium Carb-Cholecalciferol (CALCIUM + VITAMIN D3) 500-5 MG-MCG TABS Take 1 tablet by mouth daily.   melatonin 5 MG TABS Take 5 mg by mouth at bedtime.   multivitamin (RENA-VIT) TABS tablet Take 1 tablet by mouth at bedtime.   Nutritional Supplements (ENSURE ENLIVE PO) Take 237 mLs by mouth every evening.   ondansetron (ZOFRAN-ODT) 4 MG disintegrating tablet Take 4 mg by mouth daily before breakfast.   pantoprazole (PROTONIX) 40 MG tablet Take 40 mg by mouth 2 (two) times daily.   sennosides-docusate sodium (SENOKOT-S) 8.6-50 MG tablet Take 1 tablet by mouth 2 (two) times daily.   sertraline (ZOLOFT) 25 MG tablet Take 25 mg by mouth daily.   sevelamer carbonate (RENVELA) 800 MG tablet Take 800 mg by mouth daily.   sucralfate (CARAFATE) 1 GM/10ML suspension Take 1 g by mouth in the morning, at noon, in the evening, and at bedtime.   Facility-Administered Encounter Medications as of 04/09/2023  Medication   lanreotide acetate (SOMATULINE DEPOT) 120 MG/0.5ML injection   lanreotide acetate (SOMATULINE DEPOT) 120 MG/0.5ML injection   lanreotide acetate (SOMATULINE DEPOT) 120 MG/0.5ML injection   lanreotide acetate (SOMATULINE DEPOT) 120 MG/0.5ML injection   octreotide (SANDOSTATIN LAR) 30 MG IM injection   octreotide (SANDOSTATIN LAR) 30 MG IM injection     SIGNIFICANT  DIAGNOSTIC EXAMS  PREVIOUS  10-24-21: DEXA: t score -2.957  NO NEW EXAMS    LABS REVIEWED PREVIOUS     03-31-22: wbc 4.7; hgb 10.0; hct 32.2; mcv 96.4 plt 278; glucose 155; bun  28; creat 6.50; k+ 4.0; na++ 132; ca 8.0; gfr 6 protein 6.9 albumin 3.6 05-19-22: wbc 4.1; hgb 9.6; hct 30.9; mcv 98.7 plt 236; glucose 170; bun 33; creat 6.03; k+ 3.9; na++ 137; ca 8.1; gfr 6; protein 6.6 albumin 3.4; mag 2.1  06-23-22: wbc 4.9; hgb 9.1; hct 28.7 mcv 101.4 plt 286; glucose 158; bun 25; creat 5.85; k+ 3.9 na++ 136; ca 8.6 gfr 7 protein 6.8 albumin 3.3 mag 2.2 07-02-22: hgb A1c 5.1 07-07-22: wbc 4.9; hgb 10.3; hct 32.8; mcv 102.5 plt 265; glucose 141; bun 32; creat 6.15; k+ 4.3; na++ 136; ca 8.9; gfr 6 protein 7.2 albumin 3.7; mag 2.3 07-20-22: vitamin B 12: 660; tsh 1.836 08-04-22: wbc 4.6; hgb 10.7; ht 33.1; mcv 100.3 plt 234; glucose 120; bun 41; creat 6.46; k+ 4.2; na++ 135; ca 9.8; gfr 6; protein 7.3; albumin 3.7 mag 2.4 09-15-22: wbc 5.2; hgb 9.9; hct 31.0; mcv 103.3 plt 224; glucose 132; bun 22; creat 6.19; k+ 4.2; na++ 135; ca 9.5 gfr 6 protein 6.9 albumin 3.7     11-10-22: wbc 4.0; hgb 10.7; hct 33.4; mcv 102.1 plt 187; glucose 110; bun 28; creat 6.16; k+ 4.8; na++ 130; ca 9.0; gfr 6; protein 6.8 albumin 3.6 mag 2.4  12-16-22: wbc 6.7; hgb 12.4; hct 38.3; mcv 101.3 plt 201; glucose 123; bun 22; creat 4.81; k+ 4.1; na++ 133; ca 9.4; gfr 8; protein 7.9; albumin 4.1; blood culture: staphylococcus hominis  12-22-22: wbc 4.2; hgb 9.8; hct 30.1; mcv 100.0; plt 184; glucose 90; bun 81; creat 12.02; k+ 5.0; na++ 136; ca 8.8; gfr 3; phos 10.8; albumin 3.2 02-16-23: wbc 4.9; hgb 9.2; hct 28.8; mcv 104.7 plt 215 glucose 194; bun 32; creat 8.17; k+ 5.1; na++ 9.8;gfr 4; protein 7.5 albumin 3.9 mag 2.6    NO NEW LABS.   Review of Systems  Constitutional:  Negative for malaise/fatigue.  Respiratory:  Negative for cough and shortness of breath.   Cardiovascular:  Negative for chest pain, palpitations and leg swelling.   Gastrointestinal:  Negative for abdominal pain, constipation and heartburn.  Musculoskeletal:  Negative for back pain, joint pain and myalgias.  Skin: Negative.   Neurological:  Negative for dizziness.  Psychiatric/Behavioral:  The patient is not nervous/anxious.    Physical Exam Constitutional:      General: She is not in acute distress.    Appearance: She is well-developed. She is not diaphoretic.  Neck:     Thyroid: No thyromegaly.  Cardiovascular:     Rate and Rhythm: Normal rate and regular rhythm.     Pulses: Normal pulses.     Heart sounds: Normal heart sounds.  Pulmonary:     Effort: Pulmonary effort is normal. No respiratory distress.     Breath sounds: Normal breath sounds.  Abdominal:     General: Bowel sounds are normal. There is no distension.     Palpations: Abdomen is soft.     Tenderness: There is no abdominal tenderness.  Musculoskeletal:        General: Normal range of motion.     Cervical back: Neck supple.     Right lower leg: No edema.     Left lower leg: No edema.  Lymphadenopathy:     Cervical: No cervical adenopathy.  Skin:    General: Skin is warm and dry.  Neurological:     Mental Status: She is alert. Mental status is at baseline.  Psychiatric:        Mood and Affect: Mood  normal.    ASSESSMENT/ PLAN:  TODAY  ESRD on hemodialysis Dependence on hemodialysis Multiple myeloma not having achieved remission Aortic atherosclerosis  Will continue current medications Will continue current plan of care Will continue to monitor her status.   Time spent with patient: 40 minutes: medications; dialysis; dietary.    Synthia Innocent NP [facility]  call [phone]

## 2023-04-10 DIAGNOSIS — N186 End stage renal disease: Secondary | ICD-10-CM | POA: Diagnosis not present

## 2023-04-10 DIAGNOSIS — Z992 Dependence on renal dialysis: Secondary | ICD-10-CM | POA: Diagnosis not present

## 2023-04-12 DIAGNOSIS — R488 Other symbolic dysfunctions: Secondary | ICD-10-CM | POA: Diagnosis not present

## 2023-04-12 DIAGNOSIS — Z992 Dependence on renal dialysis: Secondary | ICD-10-CM | POA: Diagnosis not present

## 2023-04-12 DIAGNOSIS — N186 End stage renal disease: Secondary | ICD-10-CM | POA: Diagnosis not present

## 2023-04-12 DIAGNOSIS — R1312 Dysphagia, oropharyngeal phase: Secondary | ICD-10-CM | POA: Diagnosis not present

## 2023-04-12 DIAGNOSIS — F01518 Vascular dementia, unspecified severity, with other behavioral disturbance: Secondary | ICD-10-CM | POA: Diagnosis not present

## 2023-04-13 ENCOUNTER — Inpatient Hospital Stay: Payer: Medicare PPO

## 2023-04-13 ENCOUNTER — Ambulatory Visit: Payer: Medicare PPO

## 2023-04-13 ENCOUNTER — Inpatient Hospital Stay (HOSPITAL_BASED_OUTPATIENT_CLINIC_OR_DEPARTMENT_OTHER): Payer: Medicare PPO | Admitting: Hematology

## 2023-04-13 ENCOUNTER — Inpatient Hospital Stay: Payer: Medicare PPO | Attending: Hematology

## 2023-04-13 VITALS — BP 161/80 | HR 79 | Temp 97.9°F | Resp 16 | Wt 133.4 lb

## 2023-04-13 DIAGNOSIS — I120 Hypertensive chronic kidney disease with stage 5 chronic kidney disease or end stage renal disease: Secondary | ICD-10-CM | POA: Insufficient documentation

## 2023-04-13 DIAGNOSIS — C9000 Multiple myeloma not having achieved remission: Secondary | ICD-10-CM | POA: Diagnosis not present

## 2023-04-13 DIAGNOSIS — Z5112 Encounter for antineoplastic immunotherapy: Secondary | ICD-10-CM | POA: Diagnosis not present

## 2023-04-13 DIAGNOSIS — C50811 Malignant neoplasm of overlapping sites of right female breast: Secondary | ICD-10-CM | POA: Diagnosis not present

## 2023-04-13 DIAGNOSIS — Z1721 Progesterone receptor positive status: Secondary | ICD-10-CM | POA: Insufficient documentation

## 2023-04-13 DIAGNOSIS — Z79811 Long term (current) use of aromatase inhibitors: Secondary | ICD-10-CM | POA: Insufficient documentation

## 2023-04-13 DIAGNOSIS — Z170 Estrogen receptor positive status [ER+]: Secondary | ICD-10-CM | POA: Diagnosis not present

## 2023-04-13 DIAGNOSIS — Z1732 Human epidermal growth factor receptor 2 negative status: Secondary | ICD-10-CM | POA: Insufficient documentation

## 2023-04-13 DIAGNOSIS — E1140 Type 2 diabetes mellitus with diabetic neuropathy, unspecified: Secondary | ICD-10-CM | POA: Insufficient documentation

## 2023-04-13 DIAGNOSIS — E1122 Type 2 diabetes mellitus with diabetic chronic kidney disease: Secondary | ICD-10-CM | POA: Insufficient documentation

## 2023-04-13 DIAGNOSIS — F01518 Vascular dementia, unspecified severity, with other behavioral disturbance: Secondary | ICD-10-CM | POA: Diagnosis not present

## 2023-04-13 DIAGNOSIS — R488 Other symbolic dysfunctions: Secondary | ICD-10-CM | POA: Diagnosis not present

## 2023-04-13 DIAGNOSIS — R1312 Dysphagia, oropharyngeal phase: Secondary | ICD-10-CM | POA: Diagnosis not present

## 2023-04-13 DIAGNOSIS — Z992 Dependence on renal dialysis: Secondary | ICD-10-CM | POA: Diagnosis not present

## 2023-04-13 DIAGNOSIS — N186 End stage renal disease: Secondary | ICD-10-CM | POA: Diagnosis not present

## 2023-04-13 LAB — CBC WITH DIFFERENTIAL/PLATELET
Abs Immature Granulocytes: 0.01 10*3/uL (ref 0.00–0.07)
Basophils Absolute: 0 10*3/uL (ref 0.0–0.1)
Basophils Relative: 1 %
Eosinophils Absolute: 0.2 10*3/uL (ref 0.0–0.5)
Eosinophils Relative: 4 %
HCT: 32.9 % — ABNORMAL LOW (ref 36.0–46.0)
Hemoglobin: 10.4 g/dL — ABNORMAL LOW (ref 12.0–15.0)
Immature Granulocytes: 0 %
Lymphocytes Relative: 23 %
Lymphs Abs: 1 10*3/uL (ref 0.7–4.0)
MCH: 34.1 pg — ABNORMAL HIGH (ref 26.0–34.0)
MCHC: 31.6 g/dL (ref 30.0–36.0)
MCV: 107.9 fL — ABNORMAL HIGH (ref 80.0–100.0)
Monocytes Absolute: 0.6 10*3/uL (ref 0.1–1.0)
Monocytes Relative: 13 %
Neutro Abs: 2.6 10*3/uL (ref 1.7–7.7)
Neutrophils Relative %: 59 %
Platelets: 225 10*3/uL (ref 150–400)
RBC: 3.05 MIL/uL — ABNORMAL LOW (ref 3.87–5.11)
RDW: 15.9 % — ABNORMAL HIGH (ref 11.5–15.5)
WBC: 4.3 10*3/uL (ref 4.0–10.5)
nRBC: 0 % (ref 0.0–0.2)

## 2023-04-13 LAB — COMPREHENSIVE METABOLIC PANEL
ALT: 12 U/L (ref 0–44)
AST: 18 U/L (ref 15–41)
Albumin: 3.9 g/dL (ref 3.5–5.0)
Alkaline Phosphatase: 83 U/L (ref 38–126)
Anion gap: 15 (ref 5–15)
BUN: 25 mg/dL — ABNORMAL HIGH (ref 8–23)
CO2: 26 mmol/L (ref 22–32)
Calcium: 9.8 mg/dL (ref 8.9–10.3)
Chloride: 101 mmol/L (ref 98–111)
Creatinine, Ser: 7.03 mg/dL — ABNORMAL HIGH (ref 0.44–1.00)
GFR, Estimated: 5 mL/min — ABNORMAL LOW (ref >60)
Glucose, Bld: 114 mg/dL — ABNORMAL HIGH (ref 70–99)
Potassium: 4.6 mmol/L (ref 3.5–5.1)
Sodium: 142 mmol/L (ref 135–145)
Total Bilirubin: 0.5 mg/dL (ref 0.0–1.2)
Total Protein: 7.5 g/dL (ref 6.5–8.1)

## 2023-04-13 LAB — MAGNESIUM: Magnesium: 2.6 mg/dL — ABNORMAL HIGH (ref 1.7–2.4)

## 2023-04-13 MED ORDER — PROCHLORPERAZINE MALEATE 10 MG PO TABS
10.0000 mg | ORAL_TABLET | Freq: Once | ORAL | Status: AC
  Administered 2023-04-13: 10 mg via ORAL
  Filled 2023-04-13: qty 1

## 2023-04-13 MED ORDER — BORTEZOMIB CHEMO SQ INJECTION 3.5 MG (2.5MG/ML)
1.2000 mg/m2 | Freq: Once | INTRAMUSCULAR | Status: AC
  Administered 2023-04-13: 2 mg via SUBCUTANEOUS
  Filled 2023-04-13: qty 0.8

## 2023-04-13 NOTE — Patient Instructions (Signed)

## 2023-04-13 NOTE — Progress Notes (Signed)
 Patient has been examined by Dr. Ellin Saba. Vital signs and labs have been reviewed by MD - ANC, Creatinine, LFTs, hemoglobin, and platelets are within treatment parameters per M.D. - pt may proceed with treatment.  Primary RN and pharmacy notified.

## 2023-04-13 NOTE — Patient Instructions (Signed)
 [facility] [place] - A DEPT OF [facility]  Discharge Instructions: Thank you for choosing [facility] to provide your oncology and hematology care.  If you have a lab appointment with the Cancer Center - please note that after April 8th, 2024, all labs will be drawn in the cancer center.  You do not have to check in or register with the main entrance as you have in the past but will complete your check-in in the cancer center.  Wear comfortable clothing and clothing appropriate for easy access to any Portacath or PICC line.   We strive to give you quality time with your provider. You may need to reschedule your appointment if you arrive late (15 or more minutes).  Arriving late affects you and other patients whose appointments are after yours.  Also, if you miss three or more appointments without notifying the office, you may be dismissed from the clinic at the provider's discretion.      For prescription refill requests, have your pharmacy contact our office and allow 72 hours for refills to be completed.    Today you received the following chemotherapy and/or immunotherapy agents Velcade   To help prevent nausea and vomiting after your treatment, we encourage you to take your nausea medication as directed.  Bortezomib Injection What is this medication? BORTEZOMIB (bor TEZ oh mib) treats lymphoma. It may also be used to treat multiple myeloma, a type of bone marrow cancer. It works by blocking a protein that causes cancer cells to grow and multiply. This helps to slow or stop the spread of cancer cells. This medicine may be used for other purposes; ask your health care provider or pharmacist if you have questions. COMMON BRAND NAME(S): BORUZU, Velcade What should I tell my care team before I take this medication? They need to know if you have any of these conditions: Dehydration Diabetes Heart disease Liver disease Tingling of the fingers or toes or  other nerve disorder An unusual or allergic reaction to bortezomib, other medications, foods, dyes, or preservatives If you or your partner are pregnant or trying to get pregnant Breastfeeding How should I use this medication? This medication is injected into a vein or under the skin. It is given by your care team in a hospital or clinic setting. Talk to your care team about the use of this medication in children. Special care may be needed. Overdosage: If you think you have taken too much of this medicine contact a poison control center or emergency room at once. NOTE: This medicine is only for you. Do not share this medicine with others. What if I miss a dose? Keep appointments for follow-up doses. It is important not to miss your dose. Call your care team if you are unable to keep an appointment. What may interact with this medication? Ketoconazole Rifampin This list may not describe all possible interactions. Give your health care provider a list of all the medicines, herbs, non-prescription drugs, or dietary supplements you use. Also tell them if you smoke, drink alcohol, or use illegal drugs. Some items may interact with your medicine. What should I watch for while using this medication? Your condition will be monitored carefully while you are receiving this medication. You may need blood work while taking this medication. This medication may affect your coordination, reaction time, or judgment. Do not drive or operate machinery until you know how this medication affects you. Sit up or stand slowly to  reduce the risk of dizzy or fainting spells. Drinking alcohol with this medication can increase the risk of these side effects. This medication may increase your risk of getting an infection. Call your care team for advice if you get a fever, chills, sore throat, or other symptoms of a cold or flu. Do not treat yourself. Try to avoid being around people who are sick. Check with your care team  if you have severe diarrhea, nausea, and vomiting, or if you sweat a lot. The loss of too much body fluid may make it dangerous for you to take this medication. Talk to your care team if you may be pregnant. Serious birth defects can occur if you take this medication during pregnancy and for 7 months after the last dose. You will need a negative pregnancy test before starting this medication. Contraception is recommended while taking this medication and for 7 months after the last dose. Your care team can help you find the option that works for you. If your partner can get pregnant, use a condom during sex while taking this medication and for 4 months after the last dose. Do not breastfeed while taking this medication and for 2 months after the last dose. This medication may cause infertility. Talk to your care team if you are concerned about your fertility. What side effects may I notice from receiving this medication? Side effects that you should report to your care team as soon as possible: Allergic reactions--skin rash, itching, hives, swelling of the face, lips, tongue, or throat Bleeding--bloody or black, tar-like stools, vomiting blood or brown material that looks like coffee grounds, red or dark brown urine, small red or purple spots on skin, unusual bruising or bleeding Bleeding in the brain--severe headache, stiff neck, confusion, dizziness, change in vision, numbness or weakness of the face, arm, or leg, trouble speaking, trouble walking, vomiting Bowel blockage--stomach cramping, unable to have a bowel movement or pass gas, loss of appetite, vomiting Heart failure--shortness of breath, swelling of the ankles, feet, or hands, sudden weight gain, unusual weakness or fatigue Infection--fever, chills, cough, sore throat, wounds that don't heal, pain or trouble when passing urine, general feeling of discomfort or being unwell Liver injury--right upper belly pain, loss of appetite, nausea,  light-colored stool, dark yellow or brown urine, yellowing skin or eyes, unusual weakness or fatigue Low blood pressure--dizziness, feeling faint or lightheaded, blurry vision Lung injury--shortness of breath or trouble breathing, cough, spitting up blood, chest pain, fever Pain, tingling, or numbness in the hands or feet Severe or prolonged diarrhea Stomach pain, bloody diarrhea, pale skin, unusual weakness or fatigue, decrease in the amount of urine, which may be signs of hemolytic uremic syndrome Sudden and severe headache, confusion, change in vision, seizures, which may be signs of posterior reversible encephalopathy syndrome (PRES) TTP--purple spots on the skin or inside the mouth, pale skin, yellowing skin or eyes, unusual weakness or fatigue, fever, fast or irregular heartbeat, confusion, change in vision, trouble speaking, trouble walking Tumor lysis syndrome (TLS)--nausea, vomiting, diarrhea, decrease in the amount of urine, dark urine, unusual weakness or fatigue, confusion, muscle pain or cramps, fast or irregular heartbeat, joint pain Side effects that usually do not require medical attention (report to your care team if they continue or are bothersome): Constipation Diarrhea Fatigue Loss of appetite Nausea This list may not describe all possible side effects. Call your doctor for medical advice about side effects. You may report side effects to FDA at 1-800-FDA-1088. Where should I keep  my medication? This medication is given in a hospital or clinic. It will not be stored at home. NOTE: This sheet is a summary. It may not cover all possible information. If you have questions about this medicine, talk to your doctor, pharmacist, or health care provider.  2024 Elsevier/Gold Standard (2021-07-29 00:00:00)  BELOW ARE SYMPTOMS THAT SHOULD BE REPORTED IMMEDIATELY: *FEVER GREATER THAN 100.4 F (38 C) OR HIGHER *CHILLS OR SWEATING *NAUSEA AND VOMITING THAT IS NOT CONTROLLED WITH YOUR  NAUSEA MEDICATION *UNUSUAL SHORTNESS OF BREATH *UNUSUAL BRUISING OR BLEEDING *URINARY PROBLEMS (pain or burning when urinating, or frequent urination) *BOWEL PROBLEMS (unusual diarrhea, constipation, pain near the anus) TENDERNESS IN MOUTH AND THROAT WITH OR WITHOUT PRESENCE OF ULCERS (sore throat, sores in mouth, or a toothache) UNUSUAL RASH, SWELLING OR PAIN  UNUSUAL VAGINAL DISCHARGE OR ITCHING   Items with * indicate a potential emergency and should be followed up as soon as possible or go to the Emergency Department if any problems should occur.  Please show the CHEMOTHERAPY ALERT CARD or IMMUNOTHERAPY ALERT CARD at check-in to the Emergency Department and triage nurse.  Should you have questions after your visit or need to cancel or reschedule your appointment, please contact [facility] [place] - A DEPT OF [facility] [phone]  and follow the prompts.  Office hours are 8:00 a.m. to 4:30 p.m. Monday - Friday. Please note that voicemails left after 4:00 p.m. may not be returned until the following business day.  We are closed weekends and major holidays. You have access to a nurse at all times for urgent questions. Please call the main number to the clinic [phone] and follow the prompts.  For any non-urgent questions, you may also contact your provider using MyChart. We now offer e-Visits for anyone 40 and older to request care online for non-urgent symptoms. For details visit [email or web address].   Also download the MyChart app! Go to the app store, search "MyChart", open the app, select [place], and log in with your MyChart username and password.

## 2023-04-13 NOTE — Progress Notes (Addendum)
 Patient presents today for Velcade  infusion. Patient is in satisfactory condition with no new complaints voiced.  Vital signs are stable.  Labs reviewed by Dr. Rogers during the office visit and all labs are within treatment parameters.  We will proceed with treatment per MD orders.   Patient's treatment will be changed to weekly Velcade  per Dr.K.  Treatment given today per MD orders. Tolerated infusion without adverse affects. Vital signs stable. No complaints at this time. Discharged from clinic via wheelchair in stable condition. Alert and oriented x 3. F/U with [facility] as scheduled.

## 2023-04-13 NOTE — Progress Notes (Addendum)
 [facility] [address] [place] [address]    Clinic Day:  04/13/23   Referring physician: [name], NP  Patient Care Team: [name], NP as PCP - General (Geriatric Medicine) Shaaron, [name], MD as Consulting Physician (Gastroenterology) [name], MD as Medical Oncologist (Medical Oncology) [name], MD (Inactive) as Consulting Physician (Nephrology) [facility])   ASSESSMENT & PLAN:   Assessment: 1.  IgA lambda plasma cell myeloma, stage II, standard risk: -BMBX on 09/20/2018 shows plasma cell myeloma, 30% plasma cells.  FISH panel was normal.  Chromosome analysis normal. -Labs at diagnosis on 08/29/2018 with M spike 0.9 g.  Kappa light chains 148, lambda light chains 1132, ratio 0.13.  LDH normal.  Beta-2  microglobulin 13.5.  She had transfusion dependent anemia. -Velcade  and dexamethasone  started on 10/11/2018. -Myeloma panel on 07/11/2019 shows SPEP is negative.  Free light chain ratio improved to 1.09.  Lambda light chains are 128, improved from 141.  Kappa light chains are 140. -She has been resident at [facility] since 10/12/2019.   2.  Stage I IDC of the right breast, ER/PR positive, HER-2 negative: -She is on anastrozole .   Plan: 1.  IgA lambda plasma cell myeloma: - She is tolerating Velcade  every other week reasonably well.  She takes dexamethasone  10 mg on days of Velcade . - Reviewed labs from 03/30/2023: M spike increased to 0.4 g from 0.3 g.  FLC ratio is 0.39 and stable.  Kappa light chain is increased to 599 from 523. - She reports her fingers feel numb at nighttime.  No neuropathic pains.  No numbness or tingling in the feet. - We discussed option of intensifying Velcade  to weekly regimen.  Will pay close attention to worsening neuropathy. - RTC 6 weeks with repeat myeloma labs 1 week prior.  If no response, consider adding low-dose Revlimid 5 mg.   2.  ESRD on HD: -  Continue HD on Monday, Wednesday and Friday.   3.  Bone strengthening: - Bisphosphonates not started as she is on dialysis.   4.  Right breast cancer: - Continue anastrozole  daily.  No side effects from it.  Orders Placed This Encounter  Procedures   Magnesium    Standing Status:   Future    Expected Date:   04/27/2023    Expiration Date:   04/26/2024   CBC with Differential    Standing Status:   Future    Expected Date:   04/27/2023    Expiration Date:   04/26/2024   Comprehensive metabolic panel    Standing Status:   Future    Expected Date:   04/27/2023    Expiration Date:   04/26/2024   Magnesium    Standing Status:   Future    Expected Date:   05/04/2023    Expiration Date:   05/03/2024   CBC with Differential    Standing Status:   Future    Expected Date:   05/04/2023    Expiration Date:   05/03/2024   Comprehensive metabolic panel    Standing Status:   Future    Expected Date:   05/04/2023    Expiration Date:   05/03/2024   Magnesium    Standing Status:   Future    Expected Date:   05/11/2023    Expiration Date:   05/10/2024   CBC with Differential    Standing Status:   Future    Expected Date:  05/11/2023    Expiration Date:   05/10/2024   Comprehensive metabolic panel    Standing Status:   Future    Expected Date:   05/11/2023    Expiration Date:   05/10/2024   Magnesium    Standing Status:   Future    Expected Date:   05/18/2023    Expiration Date:   05/17/2024   CBC with Differential    Standing Status:   Future    Expected Date:   05/18/2023    Expiration Date:   05/17/2024   Comprehensive metabolic panel    Standing Status:   Future    Expected Date:   05/18/2023    Expiration Date:   05/17/2024   Magnesium    Standing Status:   Future    Expected Date:   05/25/2023    Expiration Date:   05/24/2024   CBC with Differential    Standing Status:   Future    Expected Date:   05/25/2023    Expiration Date:   05/24/2024   Comprehensive metabolic panel    Standing Status:    Future    Expected Date:   05/25/2023    Expiration Date:   05/24/2024   Magnesium    Standing Status:   Future    Expected Date:   06/01/2023    Expiration Date:   05/31/2024   CBC with Differential    Standing Status:   Future    Expected Date:   06/01/2023    Expiration Date:   05/31/2024   Comprehensive metabolic panel    Standing Status:   Future    Expected Date:   06/01/2023    Expiration Date:   05/31/2024   Magnesium    Standing Status:   Future    Expected Date:   06/08/2023    Expiration Date:   06/07/2024   CBC with Differential    Standing Status:   Future    Expected Date:   06/08/2023    Expiration Date:   06/07/2024   Comprehensive metabolic panel    Standing Status:   Future    Expected Date:   06/08/2023    Expiration Date:   06/07/2024   Magnesium    Standing Status:   Future    Expected Date:   06/15/2023    Expiration Date:   06/14/2024   CBC with Differential    Standing Status:   Future    Expected Date:   06/15/2023    Expiration Date:   06/14/2024   Comprehensive metabolic panel    Standing Status:   Future    Expected Date:   06/15/2023    Expiration Date:   06/14/2024       LILLETTE Hummingbird R Teague,acting as a scribe for Alean Stands, MD.,have documented all relevant documentation on the behalf of Alean Stands, MD,as directed by  Alean Stands, MD while in the presence of Alean Stands, MD.  I, Alean Stands MD, have reviewed the above documentation for accuracy and completeness, and I agree with the above.       Alean Stands, MD   2/4/20251:56 PM  CHIEF COMPLAINT:   Diagnosis: plasma cell myeloma and right breast cancer    Cancer Staging  Stage 1 infiltrating ductal carcinoma of right female breast [facility]) Staging form: Breast, AJCC 7th Edition - Clinical stage from 08/30/2015: Stage IA (T1c, N0, M0) - Signed by [name], PA-C on 08/30/2015    Prior Therapy: none  Current  Therapy:  maintenance Velcade  & Decadron  3/4  weeks; anastrozole    HISTORY OF PRESENT ILLNESS:   Oncology History  Stage 1 infiltrating ductal carcinoma of right female breast (HCC)  09/12/2014 Mammogram   Mass in upper outer R breast, middle third depth appears slightly larger than it was on prior exam with more irreg spiculated margins   10/02/2014 Pathology Results   biopsy with invasive ductal carcinoma high grade 1.1 cm, dcis solid type. additional R breast tissue, excision, invasive ductal high grade 0.9 cm    10/24/2014 Pathology Results   no residual invasive carcinoma, DCIS, focal, atypical ductal hyperplasia, 0/7 LN positive for metastatic carcinoma ER > 90%, PR 30%, HER 2 2+   10/24/2014 Cancer Staging   T1cN0M0   10/24/2014 Surgery   R mastectomy, T1c, N0   12/28/2014 - 11/22/2015 Chemotherapy   Taxol/herceptin  weekly X 12, Herceptin  every 21 days, last due in September 2017   03/11/2015 -  Anti-estrogen oral therapy   Arimidex  1 mg daily   09/12/2015 Imaging   MUGA- The left ventricular ejection fraction equals 68%.   06/08/2016 Treatment Plan Change   Started Nerlynx    Multiple myeloma (HCC)  10/04/2018 Initial Diagnosis   Multiple myeloma (HCC)   10/11/2018 - 10/28/2021 Chemotherapy   Patient is on Treatment Plan : MYELOMA NON-TRANSPLANT CANDIDATES VRd weekly d21d      11/25/2021 -  Chemotherapy   Patient is on Treatment Plan : MYELOMA MAINTENANCE Bortezomib  SQ q7d        INTERVAL HISTORY:   Carly Wood is a 88 y.o. female presenting to clinic today for follow up of plasma cell myeloma and breast cancer. She was last seen by me on 03/02/23.  Today, she states that she is doing well overall. Her appetite level is at 100%. Her energy level is at 50%. She reports numbness in the fingers at night. She denies any pain to the area and any symptoms of neuropathy in the feet.   PAST MEDICAL HISTORY:   Past Medical History: Past Medical History:  Diagnosis Date   Anemia     chronic macrocytic anemia   Anxiety    Chronic  kidney disease    Chronic renal disease, stage 4, severely decreased glomerular filtration rate (GFR) between 15-29 mL/min/1.73 square meter (HCC) 08/22/2015   Complication of anesthesia    delirious after Breast Surgery   Dementia (HCC)    mild   Depression    Diabetes mellitus with ESRD (end-stage renal disease) (HCC)    type II   Dysphagia    Dyspnea    with activity   GERD (gastroesophageal reflux disease)    Glaucoma    Hyperlipidemia    Hypertension    Multiple myeloma (HCC)    Pneumonia    Stage 1 infiltrating ductal carcinoma of right female breast (HCC) 08/21/2015   ER+ PR+ HER 2 neu + (3+) T1cN0     Surgical History: Past Surgical History:  Procedure Laterality Date   A/V FISTULAGRAM Right 07/05/2020   Procedure: A/V Fistulagram;  Surgeon: Harvey Carlin BRAVO, MD;  [facility];  Service: Cardiovascular;  Laterality: Right;   AV FISTULA PLACEMENT Left 11/22/2017   Procedure: ARTERIOVENOUS (AV) FISTULA CREATION LEFT ARM;  Surgeon: Harvey Carlin BRAVO, MD;  Location: [facility] OR;  Service: Vascular;  Laterality: Left;   AV FISTULA PLACEMENT Right 04/04/2020   Procedure: RIGHT ARM ARTERIOVENOUS FISTULA CREATION;  Surgeon: Oris Krystal FALCON, MD;  Location: [facility]S;  Service: Vascular;  Laterality: Right;   AV FISTULA PLACEMENT Right 08/20/2020   Procedure: ARTERIOVENOUS (AV) FISTULA LIGATION RIGHT ARM;  Surgeon: Harvey Carlin BRAVO, MD;  Location: [facility] OR;  Service: Vascular;  Laterality: Right;   BIOPSY  08/07/2016   Procedure: BIOPSY;  Surgeon: Shaaron [name], MD;  Location: [facility];  Service: Endoscopy;;  gastric ulcer biopsy   COLONOSCOPY     ESOPHAGOGASTRODUODENOSCOPY N/A 08/07/2016   LA Grade A esophagitis s/p dilation, small hiatal hernia, multiple gastric ulcers and erosions, duodenal erosions s/p biopsy. Negative H.pylori    ESOPHAGOGASTRODUODENOSCOPY N/A 11/27/2016   normal esophagus, previously noted gastric ulcers completely healed, normal duodenum.     ESOPHAGOGASTRODUODENOSCOPY (EGD) WITH PROPOFOL  N/A 07/23/2020   Procedure: ESOPHAGOGASTRODUODENOSCOPY (EGD) WITH PROPOFOL ;  Surgeon: Golda Claudis PENNER, MD;  [facility];  Service: Endoscopy;  Laterality: N/A;   FISTULA SUPERFICIALIZATION Left 02/14/2018   Procedure: FISTULA SUPERFICIALIZATION LEFT ARM;  Surgeon: Eliza Lonni RAMAN, MD;  Location: [facility] OR;  Service: Vascular;  Laterality: Left;   FRACTURE SURGERY Right    ankle   HOT HEMOSTASIS  07/23/2020   Procedure: HOT HEMOSTASIS (ARGON PLASMA COAGULATION/BICAP);  Surgeon: Golda Claudis PENNER, MD;  [facility];  Service: Endoscopy;;   INTRAMEDULLARY (IM) NAIL INTERTROCHANTERIC Right 07/12/2020   Procedure: INTRAMEDULLARY (IM) NAIL INTERTROCHANTRIC;  Surgeon: Onesimo Oneil LABOR, MD;  Location: [facility];  Service: Orthopedics;  Laterality: Right;   IR FLUORO GUIDE CV LINE LEFT  12/21/2022   IR US  GUIDE VASC ACCESS LEFT  12/21/2022   MALONEY DILATION N/A 08/07/2016   Procedure: AGAPITO DILATION;  Surgeon: Shaaron [name], MD;  [facility];  Service: Endoscopy;  Laterality: N/A;   MASTECTOMY, PARTIAL Right    PERIPHERAL VASCULAR BALLOON ANGIOPLASTY Left 07/13/2019   Procedure: PERIPHERAL VASCULAR BALLOON ANGIOPLASTY;  Surgeon: Gretta Lonni PARAS, MD;  [facility];  Service: Cardiovascular;  Laterality: Left;  arm fistulogram   PERIPHERAL VASCULAR BALLOON ANGIOPLASTY Right 05/22/2020   Procedure: PERIPHERAL VASCULAR BALLOON ANGIOPLASTY;  Surgeon: Magda Debby SAILOR, MD;  [facility]: [facility];  Service: Cardiovascular;  Laterality: Right;  arm fistula   PERIPHERAL VASCULAR BALLOON ANGIOPLASTY Right 07/05/2020   Procedure: PERIPHERAL VASCULAR BALLOON ANGIOPLASTY;  Surgeon: Harvey Carlin BRAVO, MD;  [facility]: [facility];  Service: Cardiovascular;  Laterality: Right;  arm fistula   PORT-A-CATH REMOVAL Left 11/22/2017   Procedure: REMOVAL PORT-A-CATH LEFT CHEST;  Surgeon: Harvey Carlin BRAVO,  MD;  Location: [facility] OR;  Service: Vascular;  Laterality: Left;   RETINAL DETACHMENT SURGERY Right    SCLEROTHERAPY  07/23/2020   Procedure: SCLEROTHERAPY;  Surgeon: Golda Claudis PENNER, MD;  [facility];  Service: Endoscopy;;   UPPER EXTREMITY VENOGRAPHY N/A 08/22/2020   Procedure: LEFT UPPER & CENTRAL VENOGRAPHY;  Surgeon: Gretta Lonni PARAS, MD;  [facility];  Service: Cardiovascular;  Laterality: N/A;    Social History: Social History   Socioeconomic History   Marital status: Single    Spouse name: Not on file   Number of children: Not on file   Years of education: Not on file   Highest education level: Not on file  Occupational History   Occupation: retired   Tobacco Use   Smoking status: Never   Smokeless tobacco: Never  Vaping Use   Vaping status: Never Used  Substance and Sexual Activity   Alcohol  use: No    Alcohol /week: 0.0 standard drinks of alcohol    Drug use: No  Sexual activity: Never  Other Topics Concern   Not on file  Social History Narrative   Long term resident of SNF    Social Drivers of Health   Financial Resource Strain: Low Risk  (01/09/2020)   Overall Financial Resource Strain (CARDIA)    Difficulty of Paying Living Expenses: Not very hard  Food Insecurity: No Food Insecurity (12/16/2022)   Hunger Vital Sign    Worried About Running Out of Food in the Last Year: Never true    Ran Out of Food in the Last Year: Never true  Transportation Needs: No Transportation Needs (12/16/2022)   PRAPARE - Administrator, Civil Service (Medical): No    Lack of Transportation (Non-Medical): No  Physical Activity: Inactive (01/09/2020)   Exercise Vital Sign    Days of Exercise per Week: 0 days    Minutes of Exercise per Session: 0 min  Stress: No Stress Concern Present (01/09/2020)   [employer] of Occupational Health - Occupational Stress Questionnaire    Feeling of Stress : Not at all  Social Connections: Moderately  Isolated (01/09/2020)   Social Connection and Isolation Panel [NHANES]    Frequency of Communication with Friends and Family: More than three times a week    Frequency of Social Gatherings with Friends and Family: Once a week    Attends Religious Services: More than 4 times per year    Active Member of [employer] or Organizations: No    Attends Banker Meetings: Never    Marital Status: Never married  Intimate Partner Violence: Not At Risk (12/16/2022)   Humiliation, Afraid, Rape, and Kick questionnaire    Fear of Current or Ex-Partner: No    Emotionally Abused: No    Physically Abused: No    Sexually Abused: No    Family History: Family History  Problem Relation Age of Onset   Multiple myeloma Sister    Brain cancer Sister    Dementia Mother        died at 33   Stroke Mother    Heart failure Mother    Diabetes Mother    Heart disease Father    Prostate cancer Brother    Colon cancer Neg Hx     Current Medications:  Current Outpatient Medications:    acetaminophen  (TYLENOL ) 325 MG tablet, Take 650 mg by mouth 3 (three) times daily., Disp: , Rfl:    acyclovir  (ZOVIRAX ) 200 MG capsule, Take 200 mg by mouth in the morning. (0800), Disp: , Rfl:    anastrozole  (ARIMIDEX ) 1 MG tablet, TAKE 1 TABLET BY MOUTH DAILY, Disp: 30 tablet, Rfl: 6   Calcium  Carb-Cholecalciferol  (CALCIUM  + VITAMIN D3) 500-5 MG-MCG TABS, Take 1 tablet by mouth daily., Disp: , Rfl:    melatonin 5 MG TABS, Take 5 mg by mouth at bedtime., Disp: , Rfl:    multivitamin (RENA-VIT) TABS tablet, Take 1 tablet by mouth at bedtime., Disp: , Rfl:    Nutritional Supplements (ENSURE ENLIVE PO), Take 237 mLs by mouth every evening., Disp: , Rfl:    ondansetron  (ZOFRAN -ODT) 4 MG disintegrating tablet, Take 4 mg by mouth daily before breakfast., Disp: , Rfl:    pantoprazole  (PROTONIX ) 40 MG tablet, Take 40 mg by mouth 2 (two) times daily., Disp: , Rfl:    sennosides-docusate sodium  (SENOKOT-S) 8.6-50 MG tablet, Take 1  tablet by mouth 2 (two) times daily., Disp: , Rfl:    sertraline  (ZOLOFT ) 25 MG tablet, Take 25 mg by mouth daily., Disp: ,  Rfl:    sevelamer  carbonate (RENVELA ) 800 MG tablet, Take 800 mg by mouth daily., Disp: , Rfl:    sucralfate  (CARAFATE ) 1 GM/10ML suspension, Take 1 g by mouth in the morning, at noon, in the evening, and at bedtime., Disp: , Rfl:  No current facility-administered medications for this visit.  Facility-Administered Medications Ordered in Other Visits:    bortezomib  SQ (VELCADE ) chemo injection (2.5mg /mL concentration) 2 mg, 1.2 mg/m2 (Treatment Plan Recorded), Subcutaneous, Once, [name], MD   lanreotide acetate  (SOMATULINE DEPOT ) 120 MG/0.5ML injection, , , ,    lanreotide acetate  (SOMATULINE DEPOT ) 120 MG/0.5ML injection, , , ,    lanreotide acetate  (SOMATULINE DEPOT ) 120 MG/0.5ML injection, , , ,    lanreotide acetate  (SOMATULINE DEPOT ) 120 MG/0.5ML injection, , , ,    octreotide  (SANDOSTATIN  LAR) 30 MG IM injection, , , ,    octreotide  (SANDOSTATIN  LAR) 30 MG IM injection, , , ,    Allergies: Allergies  Allergen Reactions   Ace Inhibitors Other (See Comments) and Cough    Tongue swelling Angioedema    Angiotensin Receptor Blockers Other (See Comments)    Angioedema with ACE-I   Penicillins Other (See Comments)    Unknown reaction    REVIEW OF SYSTEMS:   Review of Systems  Constitutional:  Negative for chills, fatigue and fever.  HENT:   Negative for lump/mass, mouth sores, nosebleeds, sore throat and trouble swallowing.   Eyes:  Negative for eye problems.  Respiratory:  Negative for cough and shortness of breath.   Cardiovascular:  Negative for chest pain, leg swelling and palpitations.  Gastrointestinal:  Negative for abdominal pain, constipation, diarrhea, nausea and vomiting.  Genitourinary:  Negative for bladder incontinence, difficulty urinating, dysuria, frequency, hematuria and nocturia.   Musculoskeletal:  Negative for arthralgias,  back pain, flank pain, myalgias and neck pain.  Skin:  Negative for itching and rash.  Neurological:  Negative for dizziness, headaches and numbness.  Hematological:  Does not bruise/bleed easily.  Psychiatric/Behavioral:  Negative for depression, sleep disturbance and suicidal ideas. The patient is not nervous/anxious.   All other systems reviewed and are negative.    VITALS:   Blood pressure (!) 161/80, pulse 79, temperature 97.9 F (36.6 C), temperature source Tympanic, resp. rate 16, weight 133 lb 6.1 oz (60.5 kg), SpO2 97%.  Wt Readings from Last 3 Encounters:  04/13/23 133 lb 6.1 oz (60.5 kg)  04/09/23 142 lb 9.6 oz (64.7 kg)  03/30/23 139 lb (63 kg)    Body mass index is 23.63 kg/m.  Performance status (ECOG): 1 - Symptomatic but completely ambulatory  PHYSICAL EXAM:   Physical Exam Vitals and nursing note reviewed. Exam conducted with a chaperone present.  Constitutional:      Appearance: Normal appearance.  Cardiovascular:     Rate and Rhythm: Normal rate and regular rhythm.     Pulses: Normal pulses.     Heart sounds: Normal heart sounds.  Pulmonary:     Effort: Pulmonary effort is normal.     Breath sounds: Normal breath sounds.  Abdominal:     Palpations: Abdomen is soft. There is no hepatomegaly, splenomegaly or mass.     Tenderness: There is no abdominal tenderness.  Musculoskeletal:     Right lower leg: No edema.     Left lower leg: No edema.  Lymphadenopathy:     Cervical: No cervical adenopathy.     Right cervical: No superficial, deep or posterior cervical adenopathy.    Left cervical: No superficial,  deep or posterior cervical adenopathy.     Upper Body:     Right upper body: No supraclavicular or axillary adenopathy.     Left upper body: No supraclavicular or axillary adenopathy.  Neurological:     General: No focal deficit present.     Mental Status: She is alert and oriented to person, place, and time.  Psychiatric:        Mood and Affect:  Mood normal.        Behavior: Behavior normal.     LABS:      Latest Ref Rng & Units 04/13/2023   12:29 PM 03/30/2023   12:48 PM 03/16/2023    2:41 PM  CBC  WBC 4.0 - 10.5 K/uL 4.3  3.9  4.7   Hemoglobin 12.0 - 15.0 g/dL 89.5  89.7  89.7   Hematocrit 36.0 - 46.0 % 32.9  32.6  31.9   Platelets 150 - 400 K/uL 225  239  234       Latest Ref Rng & Units 04/13/2023   12:29 PM 03/30/2023   12:48 PM 03/16/2023    2:41 PM  CMP  Glucose 70 - 99 mg/dL 885  867  896   BUN 8 - 23 mg/dL 25  27  27    Creatinine 0.44 - 1.00 mg/dL 2.96  3.21  3.69   Sodium 135 - 145 mmol/L 142  137  136   Potassium 3.5 - 5.1 mmol/L 4.6  4.8  4.7   Chloride 98 - 111 mmol/L 101  96  96   CO2 22 - 32 mmol/L 26  26  27    Calcium  8.9 - 10.3 mg/dL 9.8  9.4  9.4   Total Protein 6.5 - 8.1 g/dL 7.5  7.5    Total Bilirubin 0.0 - 1.2 mg/dL 0.5  0.3    Alkaline Phos 38 - 126 U/L 83  92    AST 15 - 41 U/L 18  20    ALT 0 - 44 U/L 12  13       No results found for: CEA1, CEA / No results found for: CEA1, CEA No results found for: PSA1 No results found for: CAN199 No results found for: RJW874  Lab Results  Component Value Date   TOTALPROTELP 6.9 03/30/2023   TOTALPROTELP 7.0 03/30/2023   ALBUMINELP 3.8 03/30/2023   A1GS 0.3 03/30/2023   A2GS 0.8 03/30/2023   BETS 1.1 03/30/2023   GAMS 0.9 03/30/2023   MSPIKE 0.4 (H) 03/30/2023   SPEI Comment 03/30/2023   Lab Results  Component Value Date   TIBC 110 (L) 12/18/2022   TIBC 140 (L) 12/16/2021   TIBC 142 (L) 08/12/2021   FERRITIN 277 12/18/2022   FERRITIN 272 12/16/2021   FERRITIN 312 (H) 08/12/2021   IRONPCTSAT 36 (H) 12/18/2022   IRONPCTSAT 39 (H) 12/16/2021   IRONPCTSAT 65 (H) 08/12/2021   Lab Results  Component Value Date   LDH 120 10/21/2021   LDH 129 10/14/2021   LDH 136 10/07/2020     STUDIES:   DG Chest 2 View Result Date: 03/16/2023 CLINICAL DATA:  Chest pain EXAM: CHEST - 2 VIEW COMPARISON:  12/18/2022 FINDINGS: Heart size is  normal. Aortic atherosclerosis and tortuosity as seen previously. Central line tip at the [facility] RA junction. The lungs are clear. No edema, infiltrate, collapse or effusion. No acute bone finding. IMPRESSION: No active disease. Aortic atherosclerosis and tortuosity. Central line tip at the [facility] RA junction. Electronically Signed  By: Oneil Officer M.D.   On: 03/16/2023 14:53

## 2023-04-14 DIAGNOSIS — R1312 Dysphagia, oropharyngeal phase: Secondary | ICD-10-CM | POA: Diagnosis not present

## 2023-04-14 DIAGNOSIS — N186 End stage renal disease: Secondary | ICD-10-CM | POA: Diagnosis not present

## 2023-04-14 DIAGNOSIS — Z992 Dependence on renal dialysis: Secondary | ICD-10-CM | POA: Diagnosis not present

## 2023-04-14 DIAGNOSIS — F01518 Vascular dementia, unspecified severity, with other behavioral disturbance: Secondary | ICD-10-CM | POA: Diagnosis not present

## 2023-04-14 DIAGNOSIS — R488 Other symbolic dysfunctions: Secondary | ICD-10-CM | POA: Diagnosis not present

## 2023-04-15 DIAGNOSIS — R488 Other symbolic dysfunctions: Secondary | ICD-10-CM | POA: Diagnosis not present

## 2023-04-15 DIAGNOSIS — F01518 Vascular dementia, unspecified severity, with other behavioral disturbance: Secondary | ICD-10-CM | POA: Diagnosis not present

## 2023-04-15 DIAGNOSIS — R1312 Dysphagia, oropharyngeal phase: Secondary | ICD-10-CM | POA: Diagnosis not present

## 2023-04-16 ENCOUNTER — Other Ambulatory Visit: Payer: Self-pay

## 2023-04-16 DIAGNOSIS — N186 End stage renal disease: Secondary | ICD-10-CM | POA: Diagnosis not present

## 2023-04-16 DIAGNOSIS — Z992 Dependence on renal dialysis: Secondary | ICD-10-CM | POA: Diagnosis not present

## 2023-04-19 DIAGNOSIS — R1312 Dysphagia, oropharyngeal phase: Secondary | ICD-10-CM | POA: Diagnosis not present

## 2023-04-19 DIAGNOSIS — R488 Other symbolic dysfunctions: Secondary | ICD-10-CM | POA: Diagnosis not present

## 2023-04-19 DIAGNOSIS — Z992 Dependence on renal dialysis: Secondary | ICD-10-CM | POA: Diagnosis not present

## 2023-04-19 DIAGNOSIS — N186 End stage renal disease: Secondary | ICD-10-CM | POA: Diagnosis not present

## 2023-04-19 DIAGNOSIS — F01518 Vascular dementia, unspecified severity, with other behavioral disturbance: Secondary | ICD-10-CM | POA: Diagnosis not present

## 2023-04-20 ENCOUNTER — Inpatient Hospital Stay: Payer: Medicare PPO

## 2023-04-20 ENCOUNTER — Other Ambulatory Visit: Payer: Self-pay

## 2023-04-20 VITALS — BP 151/77 | HR 81 | Temp 98.3°F | Resp 18 | Wt 130.5 lb

## 2023-04-20 DIAGNOSIS — C9000 Multiple myeloma not having achieved remission: Secondary | ICD-10-CM | POA: Diagnosis not present

## 2023-04-20 DIAGNOSIS — Z992 Dependence on renal dialysis: Secondary | ICD-10-CM | POA: Diagnosis not present

## 2023-04-20 DIAGNOSIS — Z170 Estrogen receptor positive status [ER+]: Secondary | ICD-10-CM | POA: Diagnosis not present

## 2023-04-20 DIAGNOSIS — F01518 Vascular dementia, unspecified severity, with other behavioral disturbance: Secondary | ICD-10-CM | POA: Diagnosis not present

## 2023-04-20 DIAGNOSIS — Z79811 Long term (current) use of aromatase inhibitors: Secondary | ICD-10-CM | POA: Diagnosis not present

## 2023-04-20 DIAGNOSIS — R1312 Dysphagia, oropharyngeal phase: Secondary | ICD-10-CM | POA: Diagnosis not present

## 2023-04-20 DIAGNOSIS — R488 Other symbolic dysfunctions: Secondary | ICD-10-CM | POA: Diagnosis not present

## 2023-04-20 DIAGNOSIS — Z1732 Human epidermal growth factor receptor 2 negative status: Secondary | ICD-10-CM | POA: Diagnosis not present

## 2023-04-20 DIAGNOSIS — C50811 Malignant neoplasm of overlapping sites of right female breast: Secondary | ICD-10-CM | POA: Diagnosis not present

## 2023-04-20 DIAGNOSIS — Z5112 Encounter for antineoplastic immunotherapy: Secondary | ICD-10-CM | POA: Diagnosis not present

## 2023-04-20 DIAGNOSIS — N186 End stage renal disease: Secondary | ICD-10-CM | POA: Diagnosis not present

## 2023-04-20 DIAGNOSIS — Z1721 Progesterone receptor positive status: Secondary | ICD-10-CM | POA: Diagnosis not present

## 2023-04-20 LAB — COMPREHENSIVE METABOLIC PANEL
ALT: 11 U/L (ref 0–44)
AST: 21 U/L (ref 15–41)
Albumin: 3.7 g/dL (ref 3.5–5.0)
Alkaline Phosphatase: 76 U/L (ref 38–126)
Anion gap: 14 (ref 5–15)
BUN: 25 mg/dL — ABNORMAL HIGH (ref 8–23)
CO2: 26 mmol/L (ref 22–32)
Calcium: 9.1 mg/dL (ref 8.9–10.3)
Chloride: 95 mmol/L — ABNORMAL LOW (ref 98–111)
Creatinine, Ser: 6.51 mg/dL — ABNORMAL HIGH (ref 0.44–1.00)
GFR, Estimated: 6 mL/min — ABNORMAL LOW (ref >60)
Glucose, Bld: 248 mg/dL — ABNORMAL HIGH (ref 70–99)
Potassium: 4.4 mmol/L (ref 3.5–5.1)
Sodium: 135 mmol/L (ref 135–145)
Total Bilirubin: 0.6 mg/dL (ref 0.0–1.2)
Total Protein: 7 g/dL (ref 6.5–8.1)

## 2023-04-20 LAB — CBC WITH DIFFERENTIAL/PLATELET
Abs Immature Granulocytes: 0.01 10*3/uL (ref 0.00–0.07)
Basophils Absolute: 0 10*3/uL (ref 0.0–0.1)
Basophils Relative: 1 %
Eosinophils Absolute: 0.1 10*3/uL (ref 0.0–0.5)
Eosinophils Relative: 2 %
HCT: 31.3 % — ABNORMAL LOW (ref 36.0–46.0)
Hemoglobin: 10.1 g/dL — ABNORMAL LOW (ref 12.0–15.0)
Immature Granulocytes: 0 %
Lymphocytes Relative: 17 %
Lymphs Abs: 0.7 10*3/uL (ref 0.7–4.0)
MCH: 34.2 pg — ABNORMAL HIGH (ref 26.0–34.0)
MCHC: 32.3 g/dL (ref 30.0–36.0)
MCV: 106.1 fL — ABNORMAL HIGH (ref 80.0–100.0)
Monocytes Absolute: 0.5 10*3/uL (ref 0.1–1.0)
Monocytes Relative: 13 %
Neutro Abs: 2.5 10*3/uL (ref 1.7–7.7)
Neutrophils Relative %: 67 %
Platelets: 172 10*3/uL (ref 150–400)
RBC: 2.95 MIL/uL — ABNORMAL LOW (ref 3.87–5.11)
RDW: 15.1 % (ref 11.5–15.5)
WBC: 3.8 10*3/uL — ABNORMAL LOW (ref 4.0–10.5)
nRBC: 0 % (ref 0.0–0.2)

## 2023-04-20 LAB — MAGNESIUM: Magnesium: 2.7 mg/dL — ABNORMAL HIGH (ref 1.7–2.4)

## 2023-04-20 MED ORDER — PROCHLORPERAZINE MALEATE 10 MG PO TABS
10.0000 mg | ORAL_TABLET | Freq: Once | ORAL | Status: AC
  Administered 2023-04-20: 10 mg via ORAL
  Filled 2023-04-20: qty 1

## 2023-04-20 MED ORDER — BORTEZOMIB CHEMO SQ INJECTION 3.5 MG (2.5MG/ML)
1.2000 mg/m2 | Freq: Once | INTRAMUSCULAR | Status: AC
  Administered 2023-04-20: 2 mg via SUBCUTANEOUS
  Filled 2023-04-20: qty 0.8

## 2023-04-20 NOTE — Patient Instructions (Signed)
[facility] [place] - A DEPT OF [facility]  Discharge Instructions: Thank you for choosing [facility] to provide your oncology and hematology care.  If you have a lab appointment with the Cancer Center - please note that after April 8th, 2024, all labs will be drawn in the cancer center.  You do not have to check in or register with the main entrance as you have in the past but will complete your check-in in the cancer center.  Wear comfortable clothing and clothing appropriate for easy access to any Portacath or PICC line.   We strive to give you quality time with your provider. You may need to reschedule your appointment if you arrive late (15 or more minutes).  Arriving late affects you and other patients whose appointments are after yours.  Also, if you miss three or more appointments without notifying the office, you may be dismissed from the clinic at the provider's discretion.      For prescription refill requests, have your pharmacy contact our office and allow 72 hours for refills to be completed.    Today you received the following chemotherapy and/or immunotherapy agents Velcade, return as scheduled.   To help prevent nausea and vomiting after your treatment, we encourage you to take your nausea medication as directed.  BELOW ARE SYMPTOMS THAT SHOULD BE REPORTED IMMEDIATELY: *FEVER GREATER THAN 100.4 F (38 C) OR HIGHER *CHILLS OR SWEATING *NAUSEA AND VOMITING THAT IS NOT CONTROLLED WITH YOUR NAUSEA MEDICATION *UNUSUAL SHORTNESS OF BREATH *UNUSUAL BRUISING OR BLEEDING *URINARY PROBLEMS (pain or burning when urinating, or frequent urination) *BOWEL PROBLEMS (unusual diarrhea, constipation, pain near the anus) TENDERNESS IN MOUTH AND THROAT WITH OR WITHOUT PRESENCE OF ULCERS (sore throat, sores in mouth, or a toothache) UNUSUAL RASH, SWELLING OR PAIN  UNUSUAL VAGINAL DISCHARGE OR ITCHING   Items with * indicate a potential emergency and  should be followed up as soon as possible or go to the Emergency Department if any problems should occur.  Please show the CHEMOTHERAPY ALERT CARD or IMMUNOTHERAPY ALERT CARD at check-in to the Emergency Department and triage nurse.  Should you have questions after your visit or need to cancel or reschedule your appointment, please contact [facility] [place] - A DEPT OF [facility] [phone]  and follow the prompts.  Office hours are 8:00 a.m. to 4:30 p.m. Monday - Friday. Please note that voicemails left after 4:00 p.m. may not be returned until the following business day.  We are closed weekends and major holidays. You have access to a nurse at all times for urgent questions. Please call the main number to the clinic [phone] and follow the prompts.  For any non-urgent questions, you may also contact your provider using MyChart. We now offer e-Visits for anyone 68 and older to request care online for non-urgent symptoms. For details visit [email or web address].   Also download the MyChart app! Go to the app store, search "MyChart", open the app, select [place], and log in with your MyChart username and password.

## 2023-04-20 NOTE — Progress Notes (Signed)
Patient tolerated Velcade injection with no complaints voiced.  Lab work reviewed.  See MAR for details.  Injection site clean and dry with no bruising or swelling noted.  Patient stable during and after injection.  Band aid applied.  VSS.  Patient left in satisfactory condition with no s/s of distress noted.

## 2023-04-21 DIAGNOSIS — F01518 Vascular dementia, unspecified severity, with other behavioral disturbance: Secondary | ICD-10-CM | POA: Diagnosis not present

## 2023-04-21 DIAGNOSIS — Z992 Dependence on renal dialysis: Secondary | ICD-10-CM | POA: Diagnosis not present

## 2023-04-21 DIAGNOSIS — R1312 Dysphagia, oropharyngeal phase: Secondary | ICD-10-CM | POA: Diagnosis not present

## 2023-04-21 DIAGNOSIS — N186 End stage renal disease: Secondary | ICD-10-CM | POA: Diagnosis not present

## 2023-04-21 DIAGNOSIS — R488 Other symbolic dysfunctions: Secondary | ICD-10-CM | POA: Diagnosis not present

## 2023-04-22 DIAGNOSIS — R1312 Dysphagia, oropharyngeal phase: Secondary | ICD-10-CM | POA: Diagnosis not present

## 2023-04-22 DIAGNOSIS — F01518 Vascular dementia, unspecified severity, with other behavioral disturbance: Secondary | ICD-10-CM | POA: Diagnosis not present

## 2023-04-22 DIAGNOSIS — R488 Other symbolic dysfunctions: Secondary | ICD-10-CM | POA: Diagnosis not present

## 2023-04-23 DIAGNOSIS — Z992 Dependence on renal dialysis: Secondary | ICD-10-CM | POA: Diagnosis not present

## 2023-04-23 DIAGNOSIS — N186 End stage renal disease: Secondary | ICD-10-CM | POA: Diagnosis not present

## 2023-04-26 DIAGNOSIS — Z992 Dependence on renal dialysis: Secondary | ICD-10-CM | POA: Diagnosis not present

## 2023-04-26 DIAGNOSIS — N186 End stage renal disease: Secondary | ICD-10-CM | POA: Diagnosis not present

## 2023-04-27 ENCOUNTER — Inpatient Hospital Stay: Payer: Medicare PPO

## 2023-04-27 ENCOUNTER — Inpatient Hospital Stay: Payer: Medicare PPO | Admitting: Hematology

## 2023-04-27 ENCOUNTER — Other Ambulatory Visit: Payer: Self-pay

## 2023-04-27 VITALS — BP 148/76 | HR 97 | Temp 97.6°F | Resp 20 | Wt 133.0 lb

## 2023-04-27 DIAGNOSIS — Z992 Dependence on renal dialysis: Secondary | ICD-10-CM | POA: Diagnosis not present

## 2023-04-27 DIAGNOSIS — C9000 Multiple myeloma not having achieved remission: Secondary | ICD-10-CM

## 2023-04-27 DIAGNOSIS — Z79811 Long term (current) use of aromatase inhibitors: Secondary | ICD-10-CM | POA: Diagnosis not present

## 2023-04-27 DIAGNOSIS — Z1721 Progesterone receptor positive status: Secondary | ICD-10-CM | POA: Diagnosis not present

## 2023-04-27 DIAGNOSIS — N186 End stage renal disease: Secondary | ICD-10-CM | POA: Diagnosis not present

## 2023-04-27 DIAGNOSIS — Z1732 Human epidermal growth factor receptor 2 negative status: Secondary | ICD-10-CM | POA: Diagnosis not present

## 2023-04-27 DIAGNOSIS — Z5112 Encounter for antineoplastic immunotherapy: Secondary | ICD-10-CM | POA: Diagnosis not present

## 2023-04-27 DIAGNOSIS — C50811 Malignant neoplasm of overlapping sites of right female breast: Secondary | ICD-10-CM | POA: Diagnosis not present

## 2023-04-27 DIAGNOSIS — Z170 Estrogen receptor positive status [ER+]: Secondary | ICD-10-CM | POA: Diagnosis not present

## 2023-04-27 LAB — CBC WITH DIFFERENTIAL/PLATELET
Abs Immature Granulocytes: 0.02 10*3/uL (ref 0.00–0.07)
Basophils Absolute: 0 10*3/uL (ref 0.0–0.1)
Basophils Relative: 1 %
Eosinophils Absolute: 0.2 10*3/uL (ref 0.0–0.5)
Eosinophils Relative: 4 %
HCT: 32.6 % — ABNORMAL LOW (ref 36.0–46.0)
Hemoglobin: 10.4 g/dL — ABNORMAL LOW (ref 12.0–15.0)
Immature Granulocytes: 1 %
Lymphocytes Relative: 22 %
Lymphs Abs: 0.9 10*3/uL (ref 0.7–4.0)
MCH: 33.5 pg (ref 26.0–34.0)
MCHC: 31.9 g/dL (ref 30.0–36.0)
MCV: 105.2 fL — ABNORMAL HIGH (ref 80.0–100.0)
Monocytes Absolute: 0.5 10*3/uL (ref 0.1–1.0)
Monocytes Relative: 10 %
Neutro Abs: 2.8 10*3/uL (ref 1.7–7.7)
Neutrophils Relative %: 62 %
Platelets: 226 10*3/uL (ref 150–400)
RBC: 3.1 MIL/uL — ABNORMAL LOW (ref 3.87–5.11)
RDW: 14.9 % (ref 11.5–15.5)
WBC: 4.4 10*3/uL (ref 4.0–10.5)
nRBC: 0 % (ref 0.0–0.2)

## 2023-04-27 LAB — COMPREHENSIVE METABOLIC PANEL
ALT: 11 U/L (ref 0–44)
AST: 17 U/L (ref 15–41)
Albumin: 3.7 g/dL (ref 3.5–5.0)
Alkaline Phosphatase: 80 U/L (ref 38–126)
Anion gap: 16 — ABNORMAL HIGH (ref 5–15)
BUN: 22 mg/dL (ref 8–23)
CO2: 26 mmol/L (ref 22–32)
Calcium: 9.4 mg/dL (ref 8.9–10.3)
Chloride: 99 mmol/L (ref 98–111)
Creatinine, Ser: 6.46 mg/dL — ABNORMAL HIGH (ref 0.44–1.00)
GFR, Estimated: 6 mL/min — ABNORMAL LOW (ref >60)
Glucose, Bld: 202 mg/dL — ABNORMAL HIGH (ref 70–99)
Potassium: 3.9 mmol/L (ref 3.5–5.1)
Sodium: 141 mmol/L (ref 135–145)
Total Bilirubin: 0.4 mg/dL (ref 0.0–1.2)
Total Protein: 7.3 g/dL (ref 6.5–8.1)

## 2023-04-27 LAB — MAGNESIUM: Magnesium: 2.4 mg/dL (ref 1.7–2.4)

## 2023-04-27 MED ORDER — BORTEZOMIB CHEMO SQ INJECTION 3.5 MG (2.5MG/ML)
1.2000 mg/m2 | Freq: Once | INTRAMUSCULAR | Status: AC
  Administered 2023-04-27: 2 mg via SUBCUTANEOUS
  Filled 2023-04-27: qty 0.8

## 2023-04-27 MED ORDER — PROCHLORPERAZINE MALEATE 10 MG PO TABS
10.0000 mg | ORAL_TABLET | Freq: Once | ORAL | Status: AC
  Administered 2023-04-27: 10 mg via ORAL
  Filled 2023-04-27: qty 1

## 2023-04-27 NOTE — Progress Notes (Signed)
 Patient tolerated Velcade injection with no complaints voiced.  Lab work reviewed.  See MAR for details.  Injection site clean and dry with no bruising or swelling noted.  Patient stable during and after injection.  Band aid applied.  VSS.  Patient left in satisfactory condition with no s/s of distress noted.

## 2023-04-27 NOTE — Patient Instructions (Signed)
 [facility] [place] - A DEPT OF [facility]  Discharge Instructions: Thank you for choosing [facility] to provide your oncology and hematology care.  If you have a lab appointment with the Cancer Center - please note that after April 8th, 2024, all labs will be drawn in the cancer center.  You do not have to check in or register with the main entrance as you have in the past but will complete your check-in in the cancer center.  Wear comfortable clothing and clothing appropriate for easy access to any Portacath or PICC line.   We strive to give you quality time with your provider. You may need to reschedule your appointment if you arrive late (15 or more minutes).  Arriving late affects you and other patients whose appointments are after yours.  Also, if you miss three or more appointments without notifying the office, you may be dismissed from the clinic at the provider's discretion.      For prescription refill requests, have your pharmacy contact our office and allow 72 hours for refills to be completed.    Today you received the following chemotherapy and/or immunotherapy agents Velcade, return as scheduled.   To help prevent nausea and vomiting after your treatment, we encourage you to take your nausea medication as directed.  BELOW ARE SYMPTOMS THAT SHOULD BE REPORTED IMMEDIATELY: *FEVER GREATER THAN 100.4 F (38 C) OR HIGHER *CHILLS OR SWEATING *NAUSEA AND VOMITING THAT IS NOT CONTROLLED WITH YOUR NAUSEA MEDICATION *UNUSUAL SHORTNESS OF BREATH *UNUSUAL BRUISING OR BLEEDING *URINARY PROBLEMS (pain or burning when urinating, or frequent urination) *BOWEL PROBLEMS (unusual diarrhea, constipation, pain near the anus) TENDERNESS IN MOUTH AND THROAT WITH OR WITHOUT PRESENCE OF ULCERS (sore throat, sores in mouth, or a toothache) UNUSUAL RASH, SWELLING OR PAIN  UNUSUAL VAGINAL DISCHARGE OR ITCHING   Items with * indicate a potential emergency and  should be followed up as soon as possible or go to the Emergency Department if any problems should occur.  Please show the CHEMOTHERAPY ALERT CARD or IMMUNOTHERAPY ALERT CARD at check-in to the Emergency Department and triage nurse.  Should you have questions after your visit or need to cancel or reschedule your appointment, please contact [facility] [place] - A DEPT OF [facility] [phone]  and follow the prompts.  Office hours are 8:00 a.m. to 4:30 p.m. Monday - Friday. Please note that voicemails left after 4:00 p.m. may not be returned until the following business day.  We are closed weekends and major holidays. You have access to a nurse at all times for urgent questions. Please call the main number to the clinic [phone] and follow the prompts.  For any non-urgent questions, you may also contact your provider using MyChart. We now offer e-Visits for anyone 68 and older to request care online for non-urgent symptoms. For details visit [email or web address].   Also download the MyChart app! Go to the app store, search "MyChart", open the app, select [place], and log in with your MyChart username and password.

## 2023-04-30 DIAGNOSIS — N186 End stage renal disease: Secondary | ICD-10-CM | POA: Diagnosis not present

## 2023-04-30 DIAGNOSIS — Z992 Dependence on renal dialysis: Secondary | ICD-10-CM | POA: Diagnosis not present

## 2023-05-03 DIAGNOSIS — N186 End stage renal disease: Secondary | ICD-10-CM | POA: Diagnosis not present

## 2023-05-03 DIAGNOSIS — Z992 Dependence on renal dialysis: Secondary | ICD-10-CM | POA: Diagnosis not present

## 2023-05-04 ENCOUNTER — Encounter: Payer: Self-pay | Admitting: Internal Medicine

## 2023-05-04 ENCOUNTER — Inpatient Hospital Stay: Payer: Medicare PPO

## 2023-05-04 ENCOUNTER — Non-Acute Institutional Stay (SKILLED_NURSING_FACILITY): Payer: Self-pay | Admitting: Internal Medicine

## 2023-05-04 ENCOUNTER — Other Ambulatory Visit: Payer: Self-pay

## 2023-05-04 VITALS — BP 144/75 | HR 82 | Temp 99.0°F | Resp 18 | Wt 146.4 lb

## 2023-05-04 DIAGNOSIS — E1122 Type 2 diabetes mellitus with diabetic chronic kidney disease: Secondary | ICD-10-CM | POA: Diagnosis not present

## 2023-05-04 DIAGNOSIS — C9000 Multiple myeloma not having achieved remission: Secondary | ICD-10-CM

## 2023-05-04 DIAGNOSIS — Z992 Dependence on renal dialysis: Secondary | ICD-10-CM | POA: Diagnosis not present

## 2023-05-04 DIAGNOSIS — R202 Paresthesia of skin: Secondary | ICD-10-CM

## 2023-05-04 DIAGNOSIS — C50911 Malignant neoplasm of unspecified site of right female breast: Secondary | ICD-10-CM

## 2023-05-04 DIAGNOSIS — F01518 Vascular dementia, unspecified severity, with other behavioral disturbance: Secondary | ICD-10-CM

## 2023-05-04 DIAGNOSIS — I10 Essential (primary) hypertension: Secondary | ICD-10-CM | POA: Diagnosis not present

## 2023-05-04 DIAGNOSIS — Z1732 Human epidermal growth factor receptor 2 negative status: Secondary | ICD-10-CM | POA: Diagnosis not present

## 2023-05-04 DIAGNOSIS — Z5112 Encounter for antineoplastic immunotherapy: Secondary | ICD-10-CM | POA: Diagnosis not present

## 2023-05-04 DIAGNOSIS — D631 Anemia in chronic kidney disease: Secondary | ICD-10-CM

## 2023-05-04 DIAGNOSIS — Z79811 Long term (current) use of aromatase inhibitors: Secondary | ICD-10-CM | POA: Diagnosis not present

## 2023-05-04 DIAGNOSIS — Z170 Estrogen receptor positive status [ER+]: Secondary | ICD-10-CM | POA: Diagnosis not present

## 2023-05-04 DIAGNOSIS — R200 Anesthesia of skin: Secondary | ICD-10-CM | POA: Diagnosis not present

## 2023-05-04 DIAGNOSIS — Z1721 Progesterone receptor positive status: Secondary | ICD-10-CM | POA: Diagnosis not present

## 2023-05-04 DIAGNOSIS — I120 Hypertensive chronic kidney disease with stage 5 chronic kidney disease or end stage renal disease: Secondary | ICD-10-CM | POA: Diagnosis not present

## 2023-05-04 DIAGNOSIS — C50811 Malignant neoplasm of overlapping sites of right female breast: Secondary | ICD-10-CM | POA: Diagnosis not present

## 2023-05-04 DIAGNOSIS — N186 End stage renal disease: Secondary | ICD-10-CM

## 2023-05-04 LAB — CBC WITH DIFFERENTIAL/PLATELET
Abs Immature Granulocytes: 0.02 10*3/uL (ref 0.00–0.07)
Basophils Absolute: 0 10*3/uL (ref 0.0–0.1)
Basophils Relative: 1 %
Eosinophils Absolute: 0.1 10*3/uL (ref 0.0–0.5)
Eosinophils Relative: 2 %
HCT: 33 % — ABNORMAL LOW (ref 36.0–46.0)
Hemoglobin: 10.7 g/dL — ABNORMAL LOW (ref 12.0–15.0)
Immature Granulocytes: 0 %
Lymphocytes Relative: 17 %
Lymphs Abs: 0.8 10*3/uL (ref 0.7–4.0)
MCH: 32.8 pg (ref 26.0–34.0)
MCHC: 32.4 g/dL (ref 30.0–36.0)
MCV: 101.2 fL — ABNORMAL HIGH (ref 80.0–100.0)
Monocytes Absolute: 0.6 10*3/uL (ref 0.1–1.0)
Monocytes Relative: 12 %
Neutro Abs: 3.3 10*3/uL (ref 1.7–7.7)
Neutrophils Relative %: 68 %
Platelets: 225 10*3/uL (ref 150–400)
RBC: 3.26 MIL/uL — ABNORMAL LOW (ref 3.87–5.11)
RDW: 14.6 % (ref 11.5–15.5)
WBC: 4.8 10*3/uL (ref 4.0–10.5)
nRBC: 0 % (ref 0.0–0.2)

## 2023-05-04 LAB — COMPREHENSIVE METABOLIC PANEL
ALT: 12 U/L (ref 0–44)
AST: 19 U/L (ref 15–41)
Albumin: 3.7 g/dL (ref 3.5–5.0)
Alkaline Phosphatase: 80 U/L (ref 38–126)
Anion gap: 13 (ref 5–15)
BUN: 25 mg/dL — ABNORMAL HIGH (ref 8–23)
CO2: 27 mmol/L (ref 22–32)
Calcium: 9.1 mg/dL (ref 8.9–10.3)
Chloride: 94 mmol/L — ABNORMAL LOW (ref 98–111)
Creatinine, Ser: 6.83 mg/dL — ABNORMAL HIGH (ref 0.44–1.00)
GFR, Estimated: 6 mL/min — ABNORMAL LOW (ref >60)
Glucose, Bld: 125 mg/dL — ABNORMAL HIGH (ref 70–99)
Potassium: 4.3 mmol/L (ref 3.5–5.1)
Sodium: 134 mmol/L — ABNORMAL LOW (ref 135–145)
Total Bilirubin: 0.4 mg/dL (ref 0.0–1.2)
Total Protein: 7.1 g/dL (ref 6.5–8.1)

## 2023-05-04 LAB — MAGNESIUM: Magnesium: 2.7 mg/dL — ABNORMAL HIGH (ref 1.7–2.4)

## 2023-05-04 MED ORDER — PROCHLORPERAZINE MALEATE 10 MG PO TABS
10.0000 mg | ORAL_TABLET | Freq: Once | ORAL | Status: AC
  Administered 2023-05-04: 10 mg via ORAL
  Filled 2023-05-04: qty 1

## 2023-05-04 MED ORDER — BORTEZOMIB CHEMO SQ INJECTION 3.5 MG (2.5MG/ML)
1.2000 mg/m2 | Freq: Once | INTRAMUSCULAR | Status: AC
  Administered 2023-05-04: 2 mg via SUBCUTANEOUS
  Filled 2023-05-04: qty 0.8

## 2023-05-04 NOTE — Assessment & Plan Note (Signed)
 Epic blood pressure recordings reviewed; the range has been, level of 127/77 up to high 197/97.  Both are outliers.  For the most part blood pressures are in the systolic range of 140-160. Blood pressure average.  Facility will be verified.  Problematic is potential for hypotension at dialysis Monday, Wednesday, and Friday and antihypertensive due to ESRD.

## 2023-05-04 NOTE — Assessment & Plan Note (Signed)
 Recorded glucoses ranged from a low up to 114 up to a high of 248.  A1c was 4.9% on 04/01/2023.  There may be discordance between A1c and recorded glucoses because of the ESRD for which she receives hemodialysis 3 days a week. Continue to monitor A1c every 4 months approximately.

## 2023-05-04 NOTE — Assessment & Plan Note (Signed)
 She is on Arimidex as maintenance chemotherapy; therefore, this remains an active diagnosis.

## 2023-05-04 NOTE — Patient Instructions (Signed)
 See assessment and plan under each diagnosis in the problem list and acutely for this visit

## 2023-05-04 NOTE — Assessment & Plan Note (Signed)
 She continues to complain of the numbness and tingling and inability to straighten her fingers at night.  Tinel's sign is negative.  TSH was last checked in May 2024 was therapeutic at 1.836.  This can be updated with a B12 level.

## 2023-05-04 NOTE — Progress Notes (Unsigned)
 NURSING HOME LOCATION:  [facility] ROOM NUMBER:    CODE STATUS:    PCP: Sharee Holster, NP   This is a nursing facility follow up visit for of chronic medical diagnoses to document compliance with Regulation 483.30 (c) in The Long Term Care Survey Manual Phase 2 which mandates caregiver visit ( visits can alternate among physician, PA or NP as per statutes) within 10 days of 30 days / 60 days/ 90 days post admission to SNF date  .  Interim medical record and care since last SNF visit was updated with review of diagnostic studies and change in clinical status since last visit were documented.  HPI: She is a permanent resident of this facility with ESRD on hemodialysis, dementia, diabetes with vascular complications, GERD, glaucoma, dyslipidemia, essential hypertension, breast cancer, and multiple myeloma not having achieved remission. Labs are current as of 3/18 and reveal creatinine of 6.46 and GFR of 6.  Serially these values have been relatively stable.  They are monitored at hemodialysis 3 days a week.  She has had mild hypomagnesemia which has resolved with current value of 2.4.  Serially anemia is stable with current H/H of 10.4/32.6.  She also exhibits consistent macrocytosis with current MCV of 105.2, down from a high of 107.9.  Last B12 level was therapeutic at 462 on 12/18/2022.  MCV on that date was 101.3.  White blood count is normal at 4400 and differential was normal. Recorded glucose range from 114 up to 248.  A1c was 4.9% on 04/01/2023.  There may be discordance because of the ESRD.   Review of systems: Dementia invalidated responses. Her only complaint is numbness in her hands & inability to straighten her fingers @ night.  She does validate that she has had hypertension "off and on."  She told me that she receives " a shot of the pill every Tuesday."  She cannot define what the medication was nor reasons for administration.  Constitutional: No fever,  significant weight change, fatigue  Eyes: No redness, discharge, pain, vision change ENT/mouth: No nasal congestion,  purulent discharge, earache, change in hearing, sore throat  Cardiovascular: No chest pain, palpitations, paroxysmal nocturnal dyspnea, claudication, edema  Respiratory: No cough, sputum production, hemoptysis, DOE, significant snoring, apnea   Gastrointestinal: No heartburn, dysphagia, abdominal pain, nausea /vomiting, rectal bleeding, melena, change in bowels Genitourinary: No dysuria, hematuria, pyuria, incontinence, nocturia Musculoskeletal: No joint stiffness, joint swelling, weakness, pain Dermatologic: No rash, pruritus, change in appearance of skin Neurologic: No dizziness, headache, syncope, seizures, numbness, tingling Psychiatric: No significant anxiety, depression, insomnia, anorexia Endocrine: No change in hair/skin/nails, excessive thirst, excessive hunger, excessive urination  Hematologic/lymphatic: No significant bruising, lymphadenopathy, abnormal bleeding Allergy/immunology: No itchy/watery eyes, significant sneezing, urticaria, angioedema  Physical exam:  Pertinent or positive findings: Affect is flat and facies masklike.  Eyebrows are decreased in density.  She appears much younger than her stated age.  She has minimal low-grade rhonchi which clear with deep inspiration.  She has a grade 1.5 systolic murmur at the base.  Abdomen is protuberant.  Edema is visible despite compression hose.  Pedal pulses are not palpable.  Tinel's sign is negative for carpal tunnel.  General appearance: Adequately nourished; no acute distress, increased work of breathing is present.   Lymphatic: No lymphadenopathy about the head, neck, axilla. Eyes: No conjunctival inflammation or lid edema is present. There is no scleral icterus. Ears:  External ear exam shows no significant lesions or deformities.   Nose:  External nasal examination shows no deformity or inflammation. Nasal  mucosa are pink and moist without lesions, exudates Oral exam:  Lips and gums are healthy appearing. There is no oropharyngeal erythema or exudate. Neck:  No thyromegaly, masses, tenderness noted.    Heart:  Normal rate and regular rhythm. S1 and S2 normal without gallop, murmur, click, rub .  Lungs: Chest clear to auscultation without wheezes, rhonchi, rales, rubs. Abdomen: Bowel sounds are normal. Abdomen is soft and nontender with no organomegaly, hernias, masses. GU: Deferred  Extremities:  No cyanosis, clubbing, edema  Neurologic exam : Cn 2-7 intact Strength equal  in upper & lower extremities Balance, Rhomberg, finger to nose testing could not be completed due to clinical state Deep tendon reflexes are equal Skin: Warm & dry w/o tenting. No significant lesions or rash.  See summary under each active problem in the Problem List with associated updated therapeutic plan

## 2023-05-04 NOTE — Progress Notes (Signed)
Patient presents today for Velcade injection per providers order.  Vital signs and labs within parameters for treatment.  Stable during administration without incident; injection site WNL; see MAR for injection details.  Patient tolerated procedure well and without incident.  No questions or complaints noted at this time.

## 2023-05-04 NOTE — Assessment & Plan Note (Signed)
 No behavioral issues reported.  She always exhibits some suspicion when I attempt to interview and examine her.

## 2023-05-04 NOTE — Patient Instructions (Signed)
 [facility] [place] - [facility]  Discharge Instructions: Thank you for choosing [facility] to provide your oncology and hematology care.  If you have a lab appointment with the Cancer Center - please note that after April 8th, 2024, all labs will be drawn in the cancer center.  You do not have to check in or register with the main entrance as you have in the past but will complete your check-in in the cancer center.  Wear comfortable clothing and clothing appropriate for easy access to any Portacath or PICC line.   We strive to give you quality time with your provider. You may need to reschedule your appointment if you arrive late (15 or more minutes).  Arriving late affects you and other patients whose appointments are after yours.  Also, if you miss three or more appointments without notifying the office, you may be dismissed from the clinic at the provider's discretion.      For prescription refill requests, have your pharmacy contact our office and allow 72 hours for refills to be completed.    Today you received the following chemotherapy and/or immunotherapy agents Velcade      To help prevent nausea and vomiting after your treatment, we encourage you to take your nausea medication as directed.  BELOW ARE SYMPTOMS THAT SHOULD BE REPORTED IMMEDIATELY: *FEVER GREATER THAN 100.4 F (38 C) OR HIGHER *CHILLS OR SWEATING *NAUSEA AND VOMITING THAT IS NOT CONTROLLED WITH YOUR NAUSEA MEDICATION *UNUSUAL SHORTNESS OF BREATH *UNUSUAL BRUISING OR BLEEDING *URINARY PROBLEMS (pain or burning when urinating, or frequent urination) *BOWEL PROBLEMS (unusual diarrhea, constipation, pain near the anus) TENDERNESS IN MOUTH AND THROAT WITH OR WITHOUT PRESENCE OF ULCERS (sore throat, sores in mouth, or a toothache) UNUSUAL RASH, SWELLING OR PAIN  UNUSUAL VAGINAL DISCHARGE OR ITCHING   Items with * indicate a potential emergency and should be followed up  as soon as possible or go to the Emergency Department if any problems should occur.  Please show the CHEMOTHERAPY ALERT CARD or IMMUNOTHERAPY ALERT CARD at check-in to the Emergency Department and triage nurse.  Should you have questions after your visit or need to cancel or reschedule your appointment, please contact [facility] [place] - A DEPT OF [facility] [phone]  and follow the prompts.  Office hours are 8:00 a.m. to 4:30 p.m. Monday - Friday. Please note that voicemails left after 4:00 p.m. may not be returned until the following business day.  We are closed weekends and major holidays. You have access to a nurse at all times for urgent questions. Please call the main number to the clinic [phone] and follow the prompts.  For any non-urgent questions, you may also contact your provider using MyChart. We now offer e-Visits for anyone 44 and older to request care online for non-urgent symptoms. For details visit [email or web address].   Also download the MyChart app! Go to the app store, search "MyChart", open the app, select [place], and log in with your MyChart username and password.

## 2023-05-04 NOTE — Assessment & Plan Note (Signed)
 Serially the macrocytic anemia is relatively stable with current H/H of 10.4/32.6.  Heme/Onc continues to monitor.   The most recent B12 level was therapeutic at 462 on 12/18/2022.  MCV on that date was 101.3; it is risen as documented with the current value of 105.2.  B12 monitor deferred to Heme/Onc.

## 2023-05-05 DIAGNOSIS — Z992 Dependence on renal dialysis: Secondary | ICD-10-CM | POA: Diagnosis not present

## 2023-05-05 DIAGNOSIS — N186 End stage renal disease: Secondary | ICD-10-CM | POA: Diagnosis not present

## 2023-05-06 DIAGNOSIS — N186 End stage renal disease: Secondary | ICD-10-CM | POA: Diagnosis not present

## 2023-05-06 DIAGNOSIS — Z992 Dependence on renal dialysis: Secondary | ICD-10-CM | POA: Diagnosis not present

## 2023-05-07 DIAGNOSIS — Z992 Dependence on renal dialysis: Secondary | ICD-10-CM | POA: Diagnosis not present

## 2023-05-07 DIAGNOSIS — N186 End stage renal disease: Secondary | ICD-10-CM | POA: Diagnosis not present

## 2023-05-08 DIAGNOSIS — N186 End stage renal disease: Secondary | ICD-10-CM | POA: Diagnosis not present

## 2023-05-08 DIAGNOSIS — Z992 Dependence on renal dialysis: Secondary | ICD-10-CM | POA: Diagnosis not present

## 2023-05-10 DIAGNOSIS — N186 End stage renal disease: Secondary | ICD-10-CM | POA: Diagnosis not present

## 2023-05-10 DIAGNOSIS — Z992 Dependence on renal dialysis: Secondary | ICD-10-CM | POA: Diagnosis not present

## 2023-05-11 ENCOUNTER — Inpatient Hospital Stay: Payer: Medicare PPO

## 2023-05-11 ENCOUNTER — Inpatient Hospital Stay: Payer: Medicare PPO | Attending: Hematology

## 2023-05-11 VITALS — BP 121/66 | HR 85 | Temp 97.8°F | Resp 18 | Wt 145.5 lb

## 2023-05-11 DIAGNOSIS — Z5112 Encounter for antineoplastic immunotherapy: Secondary | ICD-10-CM | POA: Diagnosis not present

## 2023-05-11 DIAGNOSIS — C9000 Multiple myeloma not having achieved remission: Secondary | ICD-10-CM | POA: Insufficient documentation

## 2023-05-11 DIAGNOSIS — E1165 Type 2 diabetes mellitus with hyperglycemia: Secondary | ICD-10-CM | POA: Insufficient documentation

## 2023-05-11 LAB — CBC WITH DIFFERENTIAL/PLATELET
Abs Immature Granulocytes: 0.01 10*3/uL (ref 0.00–0.07)
Basophils Absolute: 0 10*3/uL (ref 0.0–0.1)
Basophils Relative: 1 %
Eosinophils Absolute: 0.2 10*3/uL (ref 0.0–0.5)
Eosinophils Relative: 4 %
HCT: 32.9 % — ABNORMAL LOW (ref 36.0–46.0)
Hemoglobin: 10.5 g/dL — ABNORMAL LOW (ref 12.0–15.0)
Immature Granulocytes: 0 %
Lymphocytes Relative: 14 %
Lymphs Abs: 0.7 10*3/uL (ref 0.7–4.0)
MCH: 32.8 pg (ref 26.0–34.0)
MCHC: 31.9 g/dL (ref 30.0–36.0)
MCV: 102.8 fL — ABNORMAL HIGH (ref 80.0–100.0)
Monocytes Absolute: 0.6 10*3/uL (ref 0.1–1.0)
Monocytes Relative: 11 %
Neutro Abs: 3.6 10*3/uL (ref 1.7–7.7)
Neutrophils Relative %: 70 %
Platelets: 207 10*3/uL (ref 150–400)
RBC: 3.2 MIL/uL — ABNORMAL LOW (ref 3.87–5.11)
RDW: 14.8 % (ref 11.5–15.5)
WBC: 5.1 10*3/uL (ref 4.0–10.5)
nRBC: 0 % (ref 0.0–0.2)

## 2023-05-11 LAB — COMPREHENSIVE METABOLIC PANEL
ALT: 10 U/L (ref 0–44)
AST: 16 U/L (ref 15–41)
Albumin: 3.5 g/dL (ref 3.5–5.0)
Alkaline Phosphatase: 78 U/L (ref 38–126)
Anion gap: 14 (ref 5–15)
BUN: 24 mg/dL — ABNORMAL HIGH (ref 8–23)
CO2: 25 mmol/L (ref 22–32)
Calcium: 9.1 mg/dL (ref 8.9–10.3)
Chloride: 93 mmol/L — ABNORMAL LOW (ref 98–111)
Creatinine, Ser: 6.9 mg/dL — ABNORMAL HIGH (ref 0.44–1.00)
GFR, Estimated: 5 mL/min — ABNORMAL LOW (ref >60)
Glucose, Bld: 143 mg/dL — ABNORMAL HIGH (ref 70–99)
Potassium: 3.8 mmol/L (ref 3.5–5.1)
Sodium: 132 mmol/L — ABNORMAL LOW (ref 135–145)
Total Bilirubin: 0.3 mg/dL (ref 0.0–1.2)
Total Protein: 7.2 g/dL (ref 6.5–8.1)

## 2023-05-11 LAB — MAGNESIUM: Magnesium: 2.4 mg/dL (ref 1.7–2.4)

## 2023-05-11 MED ORDER — BORTEZOMIB CHEMO SQ INJECTION 3.5 MG (2.5MG/ML)
1.2000 mg/m2 | Freq: Once | INTRAMUSCULAR | Status: AC
  Administered 2023-05-11: 2 mg via SUBCUTANEOUS
  Filled 2023-05-11: qty 0.8

## 2023-05-11 MED ORDER — PROCHLORPERAZINE MALEATE 10 MG PO TABS
10.0000 mg | ORAL_TABLET | Freq: Once | ORAL | Status: AC
  Administered 2023-05-11: 10 mg via ORAL
  Filled 2023-05-11: qty 1

## 2023-05-11 NOTE — Patient Instructions (Signed)
 [facility] - A DEPT OF [facility]  Discharge Instructions: Thank you for choosing [facility] to provide your oncology and hematology care.  If you have a lab appointment with the Cancer Center - please note that after April 8th, 2024, all labs will be drawn in the cancer center.  You do not have to check in or register with the main entrance as you have in the past but will complete your check-in in the cancer center.  Wear comfortable clothing and clothing appropriate for easy access to any Portacath or PICC line.   We strive to give you quality time with your provider. You may need to reschedule your appointment if you arrive late (15 or more minutes).  Arriving late affects you and other patients whose appointments are after yours.  Also, if you miss three or more appointments without notifying the office, you may be dismissed from the clinic at the provider's discretion.      For prescription refill requests, have your pharmacy contact our office and allow 72 hours for refills to be completed.    Today you received the following chemotherapy and/or immunotherapy agents velcade. Bortezomib Injection What is this medication? BORTEZOMIB (bor TEZ oh mib) treats lymphoma. It may also be used to treat multiple myeloma, a type of bone marrow cancer. It works by blocking a protein that causes cancer cells to grow and multiply. This helps to slow or stop the spread of cancer cells. This medicine may be used for other purposes; ask your health care provider or pharmacist if you have questions. COMMON BRAND NAME(S): BORUZU, Velcade What should I tell my care team before I take this medication? They need to know if you have any of these conditions: Dehydration Diabetes Heart disease Liver disease Tingling of the fingers or toes or other nerve disorder An unusual or allergic reaction to bortezomib, other medications, foods, dyes, or preservatives If  you or your partner are pregnant or trying to get pregnant Breastfeeding How should I use this medication? This medication is injected into a vein or under the skin. It is given by your care team in a hospital or clinic setting. Talk to your care team about the use of this medication in children. Special care may be needed. Overdosage: If you think you have taken too much of this medicine contact a poison control center or emergency room at once. NOTE: This medicine is only for you. Do not share this medicine with others. What if I miss a dose? Keep appointments for follow-up doses. It is important not to miss your dose. Call your care team if you are unable to keep an appointment. What may interact with this medication? Ketoconazole Rifampin This list may not describe all possible interactions. Give your health care provider a list of all the medicines, herbs, non-prescription drugs, or dietary supplements you use. Also tell them if you smoke, drink alcohol, or use illegal drugs. Some items may interact with your medicine. What should I watch for while using this medication? Your condition will be monitored carefully while you are receiving this medication. You may need blood work while taking this medication. This medication may affect your coordination, reaction time, or judgment. Do not drive or operate machinery until you know how this medication affects you. Sit up or stand slowly to reduce the risk of dizzy or fainting spells. Drinking alcohol with this medication can increase the risk of these side effects. This  medication may increase your risk of getting an infection. Call your care team for advice if you get a fever, chills, sore throat, or other symptoms of a cold or flu. Do not treat yourself. Try to avoid being around people who are sick. Check with your care team if you have severe diarrhea, nausea, and vomiting, or if you sweat a lot. The loss of too much body fluid may make it  dangerous for you to take this medication. Talk to your care team if you may be pregnant. Serious birth defects can occur if you take this medication during pregnancy and for 7 months after the last dose. You will need a negative pregnancy test before starting this medication. Contraception is recommended while taking this medication and for 7 months after the last dose. Your care team can help you find the option that works for you. If your partner can get pregnant, use a condom during sex while taking this medication and for 4 months after the last dose. Do not breastfeed while taking this medication and for 2 months after the last dose. This medication may cause infertility. Talk to your care team if you are concerned about your fertility. What side effects may I notice from receiving this medication? Side effects that you should report to your care team as soon as possible: Allergic reactions--skin rash, itching, hives, swelling of the face, lips, tongue, or throat Bleeding--bloody or black, tar-like stools, vomiting blood or brown material that looks like coffee grounds, red or dark brown urine, small red or purple spots on skin, unusual bruising or bleeding Bleeding in the brain--severe headache, stiff neck, confusion, dizziness, change in vision, numbness or weakness of the face, arm, or leg, trouble speaking, trouble walking, vomiting Bowel blockage--stomach cramping, unable to have a bowel movement or pass gas, loss of appetite, vomiting Heart failure--shortness of breath, swelling of the ankles, feet, or hands, sudden weight gain, unusual weakness or fatigue Infection--fever, chills, cough, sore throat, wounds that don't heal, pain or trouble when passing urine, general feeling of discomfort or being unwell Liver injury--right upper belly pain, loss of appetite, nausea, light-colored stool, dark yellow or brown urine, yellowing skin or eyes, unusual weakness or fatigue Low blood  pressure--dizziness, feeling faint or lightheaded, blurry vision Lung injury--shortness of breath or trouble breathing, cough, spitting up blood, chest pain, fever Pain, tingling, or numbness in the hands or feet Severe or prolonged diarrhea Stomach pain, bloody diarrhea, pale skin, unusual weakness or fatigue, decrease in the amount of urine, which may be signs of hemolytic uremic syndrome Sudden and severe headache, confusion, change in vision, seizures, which may be signs of posterior reversible encephalopathy syndrome (PRES) TTP--purple spots on the skin or inside the mouth, pale skin, yellowing skin or eyes, unusual weakness or fatigue, fever, fast or irregular heartbeat, confusion, change in vision, trouble speaking, trouble walking Tumor lysis syndrome (TLS)--nausea, vomiting, diarrhea, decrease in the amount of urine, dark urine, unusual weakness or fatigue, confusion, muscle pain or cramps, fast or irregular heartbeat, joint pain Side effects that usually do not require medical attention (report to your care team if they continue or are bothersome): Constipation Diarrhea Fatigue Loss of appetite Nausea This list may not describe all possible side effects. Call your doctor for medical advice about side effects. You may report side effects to FDA at 1-800-FDA-1088. Where should I keep my medication? This medication is given in a hospital or clinic. It will not be stored at home. NOTE: This sheet is  a summary. It may not cover all possible information. If you have questions about this medicine, talk to your doctor, pharmacist, or health care provider.  2024 Elsevier/Gold Standard (2021-07-29 00:00:00)      To help prevent nausea and vomiting after your treatment, we encourage you to take your nausea medication as directed.  BELOW ARE SYMPTOMS THAT SHOULD BE REPORTED IMMEDIATELY: *FEVER GREATER THAN 100.4 F (38 C) OR HIGHER *CHILLS OR SWEATING *NAUSEA AND VOMITING THAT IS NOT  CONTROLLED WITH YOUR NAUSEA MEDICATION *UNUSUAL SHORTNESS OF BREATH *UNUSUAL BRUISING OR BLEEDING *URINARY PROBLEMS (pain or burning when urinating, or frequent urination) *BOWEL PROBLEMS (unusual diarrhea, constipation, pain near the anus) TENDERNESS IN MOUTH AND THROAT WITH OR WITHOUT PRESENCE OF ULCERS (sore throat, sores in mouth, or a toothache) UNUSUAL RASH, SWELLING OR PAIN  UNUSUAL VAGINAL DISCHARGE OR ITCHING   Items with * indicate a potential emergency and should be followed up as soon as possible or go to the Emergency Department if any problems should occur.  Please show the CHEMOTHERAPY ALERT CARD or IMMUNOTHERAPY ALERT CARD at check-in to the Emergency Department and triage nurse.  Should you have questions after your visit or need to cancel or reschedule your appointment, please contact [facility] [place] - A DEPT OF [facility] [phone]  and follow the prompts.  Office hours are 8:00 a.m. to 4:30 p.m. Monday - Friday. Please note that voicemails left after 4:00 p.m. may not be returned until the following business day.  We are closed weekends and major holidays. You have access to a nurse at all times for urgent questions. Please call the main number to the clinic [phone] and follow the prompts.  For any non-urgent questions, you may also contact your provider using MyChart. We now offer e-Visits for anyone 34 and older to request care online for non-urgent symptoms. For details visit [email or web address].   Also download the MyChart app! Go to the app store, search "MyChart", open the app, select [facility], and log in with your MyChart username and password.

## 2023-05-11 NOTE — Progress Notes (Signed)
 Patient presents today for Velcade injection. Patient unable to stand for weight check. Message received from Dr. Ellin Saba weight can be obtained every other week. Wheel chair weighed today 43.3 lbs. Patient on dialysis. Communication on treatment plan to ignore creatinine.   Treatment given today per MD orders. Tolerated without adverse affects. Vital signs stable. No complaints at this time. Discharged from clinic by wheel chair in stable condition. Alert and oriented x 3. F/U with [facility] as scheduled.

## 2023-05-12 DIAGNOSIS — N186 End stage renal disease: Secondary | ICD-10-CM | POA: Diagnosis not present

## 2023-05-12 DIAGNOSIS — Z992 Dependence on renal dialysis: Secondary | ICD-10-CM | POA: Diagnosis not present

## 2023-05-14 DIAGNOSIS — Z992 Dependence on renal dialysis: Secondary | ICD-10-CM | POA: Diagnosis not present

## 2023-05-14 DIAGNOSIS — N186 End stage renal disease: Secondary | ICD-10-CM | POA: Diagnosis not present

## 2023-05-17 ENCOUNTER — Other Ambulatory Visit (HOSPITAL_COMMUNITY)
Admission: RE | Admit: 2023-05-17 | Discharge: 2023-05-17 | Disposition: A | Discharge status: Home / Self Care | Source: Skilled Nursing Facility | Attending: Adult Health | Admitting: Adult Health

## 2023-05-17 DIAGNOSIS — J09X2 Influenza due to identified novel influenza A virus with other respiratory manifestations: Secondary | ICD-10-CM | POA: Insufficient documentation

## 2023-05-17 DIAGNOSIS — Z992 Dependence on renal dialysis: Secondary | ICD-10-CM | POA: Diagnosis not present

## 2023-05-17 DIAGNOSIS — N186 End stage renal disease: Secondary | ICD-10-CM | POA: Diagnosis not present

## 2023-05-17 LAB — RESP PANEL BY RT-PCR (RSV, FLU A&B, COVID)  RVPGX2
Influenza A by PCR: NEGATIVE
Influenza B by PCR: NEGATIVE
Resp Syncytial Virus by PCR: NEGATIVE
SARS Coronavirus 2 by RT PCR: NEGATIVE

## 2023-05-18 ENCOUNTER — Inpatient Hospital Stay: Payer: Medicare PPO

## 2023-05-18 VITALS — BP 131/57 | HR 86 | Temp 98.7°F | Resp 18

## 2023-05-18 DIAGNOSIS — C9000 Multiple myeloma not having achieved remission: Secondary | ICD-10-CM

## 2023-05-18 DIAGNOSIS — Z5112 Encounter for antineoplastic immunotherapy: Secondary | ICD-10-CM | POA: Diagnosis not present

## 2023-05-18 DIAGNOSIS — E1165 Type 2 diabetes mellitus with hyperglycemia: Secondary | ICD-10-CM | POA: Diagnosis not present

## 2023-05-18 LAB — MAGNESIUM: Magnesium: 2.4 mg/dL (ref 1.7–2.4)

## 2023-05-18 LAB — CBC WITH DIFFERENTIAL/PLATELET
Abs Immature Granulocytes: 0.02 10*3/uL (ref 0.00–0.07)
Basophils Absolute: 0 10*3/uL (ref 0.0–0.1)
Basophils Relative: 1 %
Eosinophils Absolute: 0.2 10*3/uL (ref 0.0–0.5)
Eosinophils Relative: 3 %
HCT: 35.2 % — ABNORMAL LOW (ref 36.0–46.0)
Hemoglobin: 11.2 g/dL — ABNORMAL LOW (ref 12.0–15.0)
Immature Granulocytes: 0 %
Lymphocytes Relative: 16 %
Lymphs Abs: 0.9 10*3/uL (ref 0.7–4.0)
MCH: 32.4 pg (ref 26.0–34.0)
MCHC: 31.8 g/dL (ref 30.0–36.0)
MCV: 101.7 fL — ABNORMAL HIGH (ref 80.0–100.0)
Monocytes Absolute: 0.4 10*3/uL (ref 0.1–1.0)
Monocytes Relative: 8 %
Neutro Abs: 3.8 10*3/uL (ref 1.7–7.7)
Neutrophils Relative %: 72 %
Platelets: 223 10*3/uL (ref 150–400)
RBC: 3.46 MIL/uL — ABNORMAL LOW (ref 3.87–5.11)
RDW: 14.8 % (ref 11.5–15.5)
WBC: 5.3 10*3/uL (ref 4.0–10.5)
nRBC: 0 % (ref 0.0–0.2)

## 2023-05-18 LAB — COMPREHENSIVE METABOLIC PANEL
ALT: 11 U/L (ref 0–44)
AST: 17 U/L (ref 15–41)
Albumin: 3.6 g/dL (ref 3.5–5.0)
Alkaline Phosphatase: 82 U/L (ref 38–126)
Anion gap: 16 — ABNORMAL HIGH (ref 5–15)
BUN: 28 mg/dL — ABNORMAL HIGH (ref 8–23)
CO2: 25 mmol/L (ref 22–32)
Calcium: 9.3 mg/dL (ref 8.9–10.3)
Chloride: 95 mmol/L — ABNORMAL LOW (ref 98–111)
Creatinine, Ser: 6.77 mg/dL — ABNORMAL HIGH (ref 0.44–1.00)
GFR, Estimated: 6 mL/min — ABNORMAL LOW (ref >60)
Glucose, Bld: 195 mg/dL — ABNORMAL HIGH (ref 70–99)
Potassium: 3.4 mmol/L — ABNORMAL LOW (ref 3.5–5.1)
Sodium: 136 mmol/L (ref 135–145)
Total Bilirubin: 0.4 mg/dL (ref 0.0–1.2)
Total Protein: 7.4 g/dL (ref 6.5–8.1)

## 2023-05-18 MED ORDER — BORTEZOMIB CHEMO SQ INJECTION 3.5 MG (2.5MG/ML)
1.2000 mg/m2 | Freq: Once | INTRAMUSCULAR | Status: AC
  Administered 2023-05-18: 2 mg via SUBCUTANEOUS
  Filled 2023-05-18: qty 0.8

## 2023-05-18 MED ORDER — PROCHLORPERAZINE MALEATE 10 MG PO TABS
10.0000 mg | ORAL_TABLET | Freq: Once | ORAL | Status: AC
  Administered 2023-05-18: 10 mg via ORAL
  Filled 2023-05-18: qty 1

## 2023-05-18 MED ORDER — POTASSIUM CHLORIDE CRYS ER 20 MEQ PO TBCR
40.0000 meq | EXTENDED_RELEASE_TABLET | Freq: Once | ORAL | Status: AC
  Administered 2023-05-18: 40 meq via ORAL
  Filled 2023-05-18: qty 2

## 2023-05-18 NOTE — Progress Notes (Signed)
 Patient presents today for Velcade injection per providers order.  Vital signs and labs within parameters for treatment.  Patient will receive 40 mEq of PO Potassium per MD standing orders.  Patient has no new complaints at this time. Stable during administration without incident; injection site WNL; see MAR for injection details.  Patient tolerated procedure well and without incident.  No questions or complaints noted at this time.

## 2023-05-18 NOTE — Patient Instructions (Signed)
 [facility] [place] - [facility]  Discharge Instructions: Thank you for choosing [facility] to provide your oncology and hematology care.  If you have a lab appointment with the Cancer Center - please note that after April 8th, 2024, all labs will be drawn in the cancer center.  You do not have to check in or register with the main entrance as you have in the past but will complete your check-in in the cancer center.  Wear comfortable clothing and clothing appropriate for easy access to any Portacath or PICC line.   We strive to give you quality time with your provider. You may need to reschedule your appointment if you arrive late (15 or more minutes).  Arriving late affects you and other patients whose appointments are after yours.  Also, if you miss three or more appointments without notifying the office, you may be dismissed from the clinic at the provider's discretion.      For prescription refill requests, have your pharmacy contact our office and allow 72 hours for refills to be completed.    Today you received the following chemotherapy and/or immunotherapy agents Velcade      To help prevent nausea and vomiting after your treatment, we encourage you to take your nausea medication as directed.  BELOW ARE SYMPTOMS THAT SHOULD BE REPORTED IMMEDIATELY: *FEVER GREATER THAN 100.4 F (38 C) OR HIGHER *CHILLS OR SWEATING *NAUSEA AND VOMITING THAT IS NOT CONTROLLED WITH YOUR NAUSEA MEDICATION *UNUSUAL SHORTNESS OF BREATH *UNUSUAL BRUISING OR BLEEDING *URINARY PROBLEMS (pain or burning when urinating, or frequent urination) *BOWEL PROBLEMS (unusual diarrhea, constipation, pain near the anus) TENDERNESS IN MOUTH AND THROAT WITH OR WITHOUT PRESENCE OF ULCERS (sore throat, sores in mouth, or a toothache) UNUSUAL RASH, SWELLING OR PAIN  UNUSUAL VAGINAL DISCHARGE OR ITCHING   Items with * indicate a potential emergency and should be followed up  as soon as possible or go to the Emergency Department if any problems should occur.  Please show the CHEMOTHERAPY ALERT CARD or IMMUNOTHERAPY ALERT CARD at check-in to the Emergency Department and triage nurse.  Should you have questions after your visit or need to cancel or reschedule your appointment, please contact [facility] [place] - A DEPT OF [facility] [phone]  and follow the prompts.  Office hours are 8:00 a.m. to 4:30 p.m. Monday - Friday. Please note that voicemails left after 4:00 p.m. may not be returned until the following business day.  We are closed weekends and major holidays. You have access to a nurse at all times for urgent questions. Please call the main number to the clinic [phone] and follow the prompts.  For any non-urgent questions, you may also contact your provider using MyChart. We now offer e-Visits for anyone 44 and older to request care online for non-urgent symptoms. For details visit [email or web address].   Also download the MyChart app! Go to the app store, search "MyChart", open the app, select [place], and log in with your MyChart username and password.

## 2023-05-19 DIAGNOSIS — N186 End stage renal disease: Secondary | ICD-10-CM | POA: Diagnosis not present

## 2023-05-19 DIAGNOSIS — Z992 Dependence on renal dialysis: Secondary | ICD-10-CM | POA: Diagnosis not present

## 2023-05-19 LAB — IMMUNOFIXATION ELECTROPHORESIS
IgA: 433 mg/dL — ABNORMAL HIGH (ref 64–422)
IgG (Immunoglobin G), Serum: 922 mg/dL (ref 586–1602)
IgM (Immunoglobulin M), Srm: 12 mg/dL — ABNORMAL LOW (ref 26–217)
Total Protein ELP: 6.7 g/dL (ref 6.0–8.5)

## 2023-05-19 LAB — KAPPA/LAMBDA LIGHT CHAINS
Kappa free light chain: 176.8 mg/L — ABNORMAL HIGH (ref 3.3–19.4)
Kappa, lambda light chain ratio: 0.34 (ref 0.26–1.65)
Lambda free light chains: 525.8 mg/L — ABNORMAL HIGH (ref 5.7–26.3)

## 2023-05-20 LAB — PROTEIN ELECTROPHORESIS, SERUM
A/G Ratio: 1.2 (ref 0.7–1.7)
Albumin ELP: 3.6 g/dL (ref 2.9–4.4)
Alpha-1-Globulin: 0.3 g/dL (ref 0.0–0.4)
Alpha-2-Globulin: 0.8 g/dL (ref 0.4–1.0)
Beta Globulin: 1.2 g/dL (ref 0.7–1.3)
Gamma Globulin: 0.9 g/dL (ref 0.4–1.8)
Globulin, Total: 3.1 g/dL (ref 2.2–3.9)
Total Protein ELP: 6.7 g/dL (ref 6.0–8.5)

## 2023-05-21 DIAGNOSIS — N186 End stage renal disease: Secondary | ICD-10-CM | POA: Diagnosis not present

## 2023-05-21 DIAGNOSIS — Z992 Dependence on renal dialysis: Secondary | ICD-10-CM | POA: Diagnosis not present

## 2023-05-24 ENCOUNTER — Other Ambulatory Visit: Payer: Self-pay

## 2023-05-24 ENCOUNTER — Encounter (HOSPITAL_COMMUNITY): Payer: Self-pay

## 2023-05-24 ENCOUNTER — Inpatient Hospital Stay (HOSPITAL_COMMUNITY)
Admission: EM | Admit: 2023-05-24 | Discharge: 2023-05-27 | DRG: 280 | Disposition: A | Discharge status: Skilled Nursing Facility | Source: Other Acute Inpatient Hospital | Attending: Internal Medicine | Admitting: Internal Medicine

## 2023-05-24 ENCOUNTER — Emergency Department (HOSPITAL_COMMUNITY)

## 2023-05-24 DIAGNOSIS — R4020 Unspecified coma: Principal | ICD-10-CM

## 2023-05-24 DIAGNOSIS — N2581 Secondary hyperparathyroidism of renal origin: Secondary | ICD-10-CM | POA: Diagnosis not present

## 2023-05-24 DIAGNOSIS — Z992 Dependence on renal dialysis: Secondary | ICD-10-CM | POA: Diagnosis not present

## 2023-05-24 DIAGNOSIS — N186 End stage renal disease: Secondary | ICD-10-CM | POA: Diagnosis present

## 2023-05-24 DIAGNOSIS — Z7982 Long term (current) use of aspirin: Secondary | ICD-10-CM

## 2023-05-24 DIAGNOSIS — M25562 Pain in left knee: Secondary | ICD-10-CM | POA: Diagnosis present

## 2023-05-24 DIAGNOSIS — R410 Disorientation, unspecified: Secondary | ICD-10-CM | POA: Diagnosis not present

## 2023-05-24 DIAGNOSIS — C9000 Multiple myeloma not having achieved remission: Secondary | ICD-10-CM | POA: Diagnosis not present

## 2023-05-24 DIAGNOSIS — Z888 Allergy status to other drugs, medicaments and biological substances status: Secondary | ICD-10-CM

## 2023-05-24 DIAGNOSIS — Z807 Family history of other malignant neoplasms of lymphoid, hematopoietic and related tissues: Secondary | ICD-10-CM

## 2023-05-24 DIAGNOSIS — Z8711 Personal history of peptic ulcer disease: Secondary | ICD-10-CM

## 2023-05-24 DIAGNOSIS — Z9011 Acquired absence of right breast and nipple: Secondary | ICD-10-CM

## 2023-05-24 DIAGNOSIS — I953 Hypotension of hemodialysis: Secondary | ICD-10-CM | POA: Diagnosis not present

## 2023-05-24 DIAGNOSIS — I120 Hypertensive chronic kidney disease with stage 5 chronic kidney disease or end stage renal disease: Secondary | ICD-10-CM | POA: Diagnosis present

## 2023-05-24 DIAGNOSIS — M25561 Pain in right knee: Secondary | ICD-10-CM | POA: Diagnosis present

## 2023-05-24 DIAGNOSIS — K219 Gastro-esophageal reflux disease without esophagitis: Secondary | ICD-10-CM | POA: Diagnosis present

## 2023-05-24 DIAGNOSIS — R011 Cardiac murmur, unspecified: Secondary | ICD-10-CM | POA: Diagnosis not present

## 2023-05-24 DIAGNOSIS — Z833 Family history of diabetes mellitus: Secondary | ICD-10-CM | POA: Diagnosis not present

## 2023-05-24 DIAGNOSIS — E1122 Type 2 diabetes mellitus with diabetic chronic kidney disease: Secondary | ICD-10-CM | POA: Diagnosis not present

## 2023-05-24 DIAGNOSIS — Z66 Do not resuscitate: Secondary | ICD-10-CM | POA: Diagnosis not present

## 2023-05-24 DIAGNOSIS — D631 Anemia in chronic kidney disease: Secondary | ICD-10-CM | POA: Diagnosis present

## 2023-05-24 DIAGNOSIS — Z8042 Family history of malignant neoplasm of prostate: Secondary | ICD-10-CM

## 2023-05-24 DIAGNOSIS — Z79811 Long term (current) use of aromatase inhibitors: Secondary | ICD-10-CM

## 2023-05-24 DIAGNOSIS — E785 Hyperlipidemia, unspecified: Secondary | ICD-10-CM | POA: Diagnosis present

## 2023-05-24 DIAGNOSIS — N250 Renal osteodystrophy: Secondary | ICD-10-CM | POA: Diagnosis present

## 2023-05-24 DIAGNOSIS — R9431 Abnormal electrocardiogram [ECG] [EKG]: Secondary | ICD-10-CM

## 2023-05-24 DIAGNOSIS — Z880 Allergy status to penicillin: Secondary | ICD-10-CM

## 2023-05-24 DIAGNOSIS — C50919 Malignant neoplasm of unspecified site of unspecified female breast: Secondary | ICD-10-CM | POA: Diagnosis present

## 2023-05-24 DIAGNOSIS — W19XXXA Unspecified fall, initial encounter: Secondary | ICD-10-CM | POA: Diagnosis not present

## 2023-05-24 DIAGNOSIS — I214 Non-ST elevation (NSTEMI) myocardial infarction: Principal | ICD-10-CM | POA: Diagnosis present

## 2023-05-24 DIAGNOSIS — Z79899 Other long term (current) drug therapy: Secondary | ICD-10-CM

## 2023-05-24 DIAGNOSIS — R609 Edema, unspecified: Secondary | ICD-10-CM | POA: Diagnosis not present

## 2023-05-24 DIAGNOSIS — Z170 Estrogen receptor positive status [ER+]: Secondary | ICD-10-CM | POA: Diagnosis not present

## 2023-05-24 DIAGNOSIS — F01518 Vascular dementia, unspecified severity, with other behavioral disturbance: Secondary | ICD-10-CM | POA: Diagnosis present

## 2023-05-24 DIAGNOSIS — M160 Bilateral primary osteoarthritis of hip: Secondary | ICD-10-CM | POA: Diagnosis not present

## 2023-05-24 DIAGNOSIS — Z1721 Progesterone receptor positive status: Secondary | ICD-10-CM | POA: Diagnosis not present

## 2023-05-24 DIAGNOSIS — M1711 Unilateral primary osteoarthritis, right knee: Secondary | ICD-10-CM | POA: Diagnosis not present

## 2023-05-24 DIAGNOSIS — R55 Syncope and collapse: Secondary | ICD-10-CM | POA: Diagnosis not present

## 2023-05-24 DIAGNOSIS — Z8249 Family history of ischemic heart disease and other diseases of the circulatory system: Secondary | ICD-10-CM | POA: Diagnosis not present

## 2023-05-24 DIAGNOSIS — R7989 Other specified abnormal findings of blood chemistry: Secondary | ICD-10-CM | POA: Diagnosis present

## 2023-05-24 DIAGNOSIS — Z823 Family history of stroke: Secondary | ICD-10-CM

## 2023-05-24 DIAGNOSIS — I350 Nonrheumatic aortic (valve) stenosis: Secondary | ICD-10-CM

## 2023-05-24 DIAGNOSIS — Z808 Family history of malignant neoplasm of other organs or systems: Secondary | ICD-10-CM

## 2023-05-24 DIAGNOSIS — I10 Essential (primary) hypertension: Secondary | ICD-10-CM | POA: Diagnosis not present

## 2023-05-24 LAB — COMPREHENSIVE METABOLIC PANEL
ALT: 14 U/L (ref 0–44)
AST: 28 U/L (ref 15–41)
Albumin: 3.7 g/dL (ref 3.5–5.0)
Alkaline Phosphatase: 80 U/L (ref 38–126)
Anion gap: 19 — ABNORMAL HIGH (ref 5–15)
BUN: 42 mg/dL — ABNORMAL HIGH (ref 8–23)
CO2: 23 mmol/L (ref 22–32)
Calcium: 9.6 mg/dL (ref 8.9–10.3)
Chloride: 95 mmol/L — ABNORMAL LOW (ref 98–111)
Creatinine, Ser: 9.5 mg/dL — ABNORMAL HIGH (ref 0.44–1.00)
GFR, Estimated: 4 mL/min — ABNORMAL LOW (ref >60)
Glucose, Bld: 95 mg/dL (ref 70–99)
Potassium: 4.3 mmol/L (ref 3.5–5.1)
Sodium: 137 mmol/L (ref 135–145)
Total Bilirubin: 0.5 mg/dL (ref 0.0–1.2)
Total Protein: 7.1 g/dL (ref 6.5–8.1)

## 2023-05-24 LAB — CBC WITH DIFFERENTIAL/PLATELET
Abs Immature Granulocytes: 0.03 10*3/uL (ref 0.00–0.07)
Basophils Absolute: 0 10*3/uL (ref 0.0–0.1)
Basophils Relative: 1 %
Eosinophils Absolute: 0.3 10*3/uL (ref 0.0–0.5)
Eosinophils Relative: 4 %
HCT: 34.3 % — ABNORMAL LOW (ref 36.0–46.0)
Hemoglobin: 11 g/dL — ABNORMAL LOW (ref 12.0–15.0)
Immature Granulocytes: 0 %
Lymphocytes Relative: 9 %
Lymphs Abs: 0.7 10*3/uL (ref 0.7–4.0)
MCH: 32.5 pg (ref 26.0–34.0)
MCHC: 32.1 g/dL (ref 30.0–36.0)
MCV: 101.5 fL — ABNORMAL HIGH (ref 80.0–100.0)
Monocytes Absolute: 0.6 10*3/uL (ref 0.1–1.0)
Monocytes Relative: 8 %
Neutro Abs: 6 10*3/uL (ref 1.7–7.7)
Neutrophils Relative %: 78 %
Platelets: 209 10*3/uL (ref 150–400)
RBC: 3.38 MIL/uL — ABNORMAL LOW (ref 3.87–5.11)
RDW: 14.9 % (ref 11.5–15.5)
WBC: 7.6 10*3/uL (ref 4.0–10.5)
nRBC: 0 % (ref 0.0–0.2)

## 2023-05-24 LAB — TROPONIN I (HIGH SENSITIVITY)
Troponin I (High Sensitivity): 2172 ng/L (ref <18)
Troponin I (High Sensitivity): 3755 ng/L (ref <18)

## 2023-05-24 LAB — MAGNESIUM: Magnesium: 2.7 mg/dL — ABNORMAL HIGH (ref 1.7–2.4)

## 2023-05-24 LAB — MRSA NEXT GEN BY PCR, NASAL: MRSA by PCR Next Gen: NOT DETECTED

## 2023-05-24 MED ORDER — PANTOPRAZOLE SODIUM 40 MG PO TBEC
40.0000 mg | DELAYED_RELEASE_TABLET | Freq: Two times a day (BID) | ORAL | Status: DC
  Administered 2023-05-24 – 2023-05-27 (×6): 40 mg via ORAL
  Filled 2023-05-24 (×6): qty 1

## 2023-05-24 MED ORDER — ATORVASTATIN CALCIUM 40 MG PO TABS
40.0000 mg | ORAL_TABLET | Freq: Every day | ORAL | Status: DC
  Administered 2023-05-24 – 2023-05-27 (×4): 40 mg via ORAL
  Filled 2023-05-24 (×4): qty 1

## 2023-05-24 MED ORDER — ANASTROZOLE 1 MG PO TABS
1.0000 mg | ORAL_TABLET | Freq: Every day | ORAL | Status: DC
  Administered 2023-05-25 – 2023-05-27 (×3): 1 mg via ORAL
  Filled 2023-05-24 (×4): qty 1

## 2023-05-24 MED ORDER — OYSTER SHELL CALCIUM/D3 500-5 MG-MCG PO TABS
1.0000 | ORAL_TABLET | Freq: Every day | ORAL | Status: DC
Start: 2023-05-24 — End: 2023-05-27
  Administered 2023-05-24 – 2023-05-27 (×4): 1 via ORAL
  Filled 2023-05-24 (×7): qty 1

## 2023-05-24 MED ORDER — ASPIRIN 81 MG PO TBEC
81.0000 mg | DELAYED_RELEASE_TABLET | Freq: Every day | ORAL | Status: DC
Start: 2023-05-25 — End: 2023-05-27
  Administered 2023-05-25 – 2023-05-27 (×3): 81 mg via ORAL
  Filled 2023-05-24 (×3): qty 1

## 2023-05-24 MED ORDER — ACETAMINOPHEN 325 MG PO TABS: 650.0000 mg | ORAL_TABLET | Freq: Four times a day (QID) | ORAL | Status: DC | PRN

## 2023-05-24 MED ORDER — HEPARIN BOLUS VIA INFUSION
2000.0000 [IU] | Freq: Once | INTRAVENOUS | Status: AC
  Administered 2023-05-25: 2000 [IU] via INTRAVENOUS
  Filled 2023-05-24: qty 2000

## 2023-05-24 MED ORDER — ACETAMINOPHEN 650 MG RE SUPP: 650.0000 mg | Freq: Four times a day (QID) | RECTAL | Status: DC | PRN

## 2023-05-24 MED ORDER — POLYETHYLENE GLYCOL 3350 17 G PO PACK: 17.0000 g | PACK | Freq: Every day | ORAL | Status: DC | PRN

## 2023-05-24 MED ORDER — HEPARIN (PORCINE) 25000 UT/250ML-% IV SOLN
950.0000 [IU]/h | INTRAVENOUS | Status: DC
  Administered 2023-05-25: 600 [IU]/h via INTRAVENOUS
  Administered 2023-05-26: 1000 [IU]/h via INTRAVENOUS
  Administered 2023-05-27: 950 [IU]/h via INTRAVENOUS
  Filled 2023-05-24 (×4): qty 250

## 2023-05-24 MED ORDER — HEPARIN SODIUM (PORCINE) 5000 UNIT/ML IJ SOLN
5000.0000 [IU] | Freq: Three times a day (TID) | INTRAMUSCULAR | Status: DC
  Administered 2023-05-24: 5000 [IU] via SUBCUTANEOUS
  Filled 2023-05-24: qty 1

## 2023-05-24 MED ORDER — SEVELAMER CARBONATE 800 MG PO TABS
800.0000 mg | ORAL_TABLET | Freq: Every day | ORAL | Status: DC
  Administered 2023-05-24 – 2023-05-26 (×3): 800 mg via ORAL
  Filled 2023-05-24 (×3): qty 1

## 2023-05-24 MED ORDER — ASPIRIN 325 MG PO TABS
325.0000 mg | ORAL_TABLET | Freq: Once | ORAL | Status: AC
  Administered 2023-05-24: 325 mg via ORAL
  Filled 2023-05-24: qty 1

## 2023-05-24 NOTE — Assessment & Plan Note (Addendum)
 Patient reported syncope which was not confirmed by nursing home staff.  EKG showing T wave changes in V4 through V6, nonspecific T wave abnormalities in 2 3 aVF , hence troponin checked-elevated -2172 >> 3755.  She is without chest pain or difficulty breathing.  I re-evaluated her -is calm no sign of distress. -Trend troponin -I spoke to cardiology fellow on-call Dr. Lendell Caprice, patient can stay here tonight-, cardiology team to evaluate in the morning, aspirin, echo, heparin drip.

## 2023-05-24 NOTE — Progress Notes (Signed)
 PHARMACY - ANTICOAGULATION CONSULT NOTE  Pharmacy Consult for heparin Indication: chest pain/ACS  Allergies  Allergen Reactions   Ace Inhibitors Other (See Comments) and Cough    Tongue swelling Angioedema    Angiotensin Receptor Blockers Other (See Comments)    Angioedema with ACE-I   Penicillins Other (See Comments)    Unknown reaction    Patient Measurements: Height: 4\' 8"  (142.2 cm) Weight: 66 kg (145 lb 7.7 oz) IBW/kg (Calculated) : 36.3 Heparin Dosing Weight: 52 kg  Vital Signs: Temp: 97.6 F (36.4 C) (03/17 2025) Temp Source: Oral (03/17 2025) BP: 163/85 (03/17 2025) Pulse Rate: 79 (03/17 2025)  Labs: Recent Labs    05/24/23 1352  HGB 11.0*  HCT 34.3*  PLT 209  CREATININE 9.50*  TROPONINIHS 2,172*    Estimated Creatinine Clearance: 3.3 mL/min (A) (by C-G formula based on SCr of 9.5 mg/dL (H)).  Medications:  -No oral anticoagulation -Received 1 dose of subcutaneous heparin 5000 units ~2145  Assessment: 27 yoF presented to ED with right knee pain and syncope and found to to have elevated troponin at 2,172 without chest pain. Pharmacy consulted to dose heparin for ACS.  -Hgb 11, plts 209 -Trops 2172 -ESRD (HD MWF)  Goal of Therapy:  Heparin level 0.3-0.7 units/ml Monitor platelets by anticoagulation protocol: Yes   Plan:  Give 2000 units bolus x 1 Start heparin infusion at 600 units/hr Check anti-Xa level in 8 hours and daily while on heparin Continue to monitor H&H and platelets  Arabella Merles, PharmD. Clinical Pharmacist 05/24/2023 10:45 PM

## 2023-05-24 NOTE — ED Notes (Signed)
 Patient transported to CT

## 2023-05-24 NOTE — Plan of Care (Signed)
   Problem: Activity: Goal: Risk for activity intolerance will decrease Outcome: Progressing   Problem: Coping: Goal: Level of anxiety will decrease Outcome: Progressing

## 2023-05-24 NOTE — H&P (Addendum)
 History and Physical    Carly Wood WJX:[id number] DOB: 1935-08-25 DOA: 05/24/2023  PCP: Sharee Holster, NP   Patient coming from: Home  I have personally briefly reviewed patient's old medical records in [facility]  Chief Complaint: Passing out  HPI: Carly Wood is a 88 y.o. female with medical history significant for ESRD, diabetes mellitus, multiple myeloma, breast cancer. Patient presented to the ED with complaints of right knee pain and passing out.  On my evaluation, patient is giving conflicting reports she tells me she actually did not go for dialysis today that she was at [facility] when she felt ill-she cannot explain likely what, she tells me this is why she was not sent for dialysis.  She also reports falling and passing out, but tells me this was not today, it was the previous day.  She denies chest pain, no difficulty breathing, no dizziness.  Unfortunately ER staff were unable to reach nursing home.  I was able to reach nursing home and talked to patient's nurse, he tells me patient did not fall today or in ED previously, she did not pass out.  Today she was sent for dialysis , when she got there she was complaining of knee pain and refused to get up so dialysis was not done.  She was sent to the ER from there.  Patient has episodes of confusion at baseline.  She has not had any other complaints of symptoms, she has had good oral intake, no vomiting no diarrhea no difficulty breathing, no fevers no chills no cough.  ED Course: Temp 98.5, pulse rate 80-99, respiratory 20-21.  Blood pressure 150s to 177.  O2 sat greater than 95% on room air.Marland Kitchen  Unremarkable CBC, AMP consistent with ESRD status. Hip and knee x-ray negative for acute abnormality. Hospitalist to admit for syncope.  Review of Systems: As per HPI all other systems reviewed and negative.  Past Medical History:  Diagnosis Date   Anemia     chronic macrocytic anemia   Anxiety    Chronic kidney  disease    Chronic renal disease, stage 4, severely decreased glomerular filtration rate (GFR) between 15-29 mL/min/1.73 square meter (HCC) 08/22/2015   Complication of anesthesia    delirious after Breast Surgery   Dementia (HCC)    mild   Depression    Diabetes mellitus with ESRD (end-stage renal disease) (HCC)    type II   Dysphagia    Dyspnea    with activity   GERD (gastroesophageal reflux disease)    Glaucoma    Hyperlipidemia    Hypertension    Multiple myeloma (HCC)    Pneumonia    Stage 1 infiltrating ductal carcinoma of right female breast (HCC) 08/21/2015   ER+ PR+ HER 2 neu + (3+) T1cN0     Past Surgical History:  Procedure Laterality Date   A/V FISTULAGRAM Right 07/05/2020   Procedure: A/V Fistulagram;  Surgeon: Sherren Kerns, MD;  [facility];  Service: Cardiovascular;  Laterality: Right;   AV FISTULA PLACEMENT Left 11/22/2017   Procedure: ARTERIOVENOUS (AV) FISTULA CREATION LEFT ARM;  Surgeon: Sherren Kerns, MD;  Location[facility] OR;  Service: Vascular;  Laterality: Left;   AV FISTULA PLACEMENT Right 04/04/2020   Procedure: RIGHT ARM ARTERIOVENOUS FISTULA CREATION;  Surgeon: Larina Earthly, MD;  Location: [facility] ORS;  Service: Vascular;  Laterality: Right;   AV FISTULA PLACEMENT Right 08/20/2020   Procedure: ARTERIOVENOUS (AV) FISTULA LIGATION RIGHT  ARM;  Surgeon: Sherren Kerns, MD;  Location: [facility] OR;  Service: Vascular;  Laterality: Right;   BIOPSY  08/07/2016   Procedure: BIOPSY;  Surgeon: Corbin Ade, MD;  Location: [facility];  Service: Endoscopy;;  gastric ulcer biopsy   COLONOSCOPY     ESOPHAGOGASTRODUODENOSCOPY N/A 08/07/2016   LA Grade A esophagitis s/p dilation, small hiatal hernia, multiple gastric ulcers and erosions, duodenal erosions s/p biopsy. Negative H.pylori    ESOPHAGOGASTRODUODENOSCOPY N/A 11/27/2016   normal esophagus, previously noted gastric ulcers completely healed, normal duodenum.    ESOPHAGOGASTRODUODENOSCOPY  (EGD) WITH PROPOFOL N/A 07/23/2020   Procedure: ESOPHAGOGASTRODUODENOSCOPY (EGD) WITH PROPOFOL;  Surgeon: Malissa Hippo, MD;  [facility];  Service: Endoscopy;  Laterality: N/A;   FISTULA SUPERFICIALIZATION Left 02/14/2018   Procedure: FISTULA SUPERFICIALIZATION LEFT ARM;  Surgeon: Chuck Hint, MD;  Location: [facility] OR;  Service: Vascular;  Laterality: Left;   FRACTURE SURGERY Right    ankle   HOT HEMOSTASIS  07/23/2020   Procedure: HOT HEMOSTASIS (ARGON PLASMA COAGULATION/BICAP);  Surgeon: Malissa Hippo, MD;  [facility];  Service: Endoscopy;;   INTRAMEDULLARY (IM) NAIL INTERTROCHANTERIC Right 07/12/2020   Procedure: INTRAMEDULLARY (IM) NAIL INTERTROCHANTRIC;  Surgeon: Oliver Barre, MD;  Location: [facility];  Service: Orthopedics;  Laterality: Right;   IR FLUORO GUIDE CV LINE LEFT  12/21/2022   IR US GUIDE VASC ACCESS LEFT  12/21/2022   MALONEY DILATION N/A 08/07/2016   Procedure: Elease Hashimoto DILATION;  Surgeon: Corbin Ade, MD;  [facility];  Service: Endoscopy;  Laterality: N/A;   MASTECTOMY, PARTIAL Right    PERIPHERAL VASCULAR BALLOON ANGIOPLASTY Left 07/13/2019   Procedure: PERIPHERAL VASCULAR BALLOON ANGIOPLASTY;  Surgeon: Cephus Shelling, MD;  [facility];  Service: Cardiovascular;  Laterality: Left;  arm fistulogram   PERIPHERAL VASCULAR BALLOON ANGIOPLASTY Right 05/22/2020   Procedure: PERIPHERAL VASCULAR BALLOON ANGIOPLASTY;  Surgeon: Leonie Douglas, MD;  [facility];  Service: Cardiovascular;  Laterality: Right;  arm fistula   PERIPHERAL VASCULAR BALLOON ANGIOPLASTY Right 07/05/2020   Procedure: PERIPHERAL VASCULAR BALLOON ANGIOPLASTY;  Surgeon: Sherren Kerns, MD;  [facility];  Service: Cardiovascular;  Laterality: Right;  arm fistula   PORT-A-CATH REMOVAL Left 11/22/2017   Procedure: REMOVAL PORT-A-CATH LEFT CHEST;  Surgeon: Sherren Kerns, MD;  Location: [facility] OR;   Service: Vascular;  Laterality: Left;   RETINAL DETACHMENT SURGERY Right    SCLEROTHERAPY  07/23/2020   Procedure: SCLEROTHERAPY;  Surgeon: Malissa Hippo, MD;  [facility];  Service: Endoscopy;;   UPPER EXTREMITY VENOGRAPHY N/A 08/22/2020   Procedure: LEFT UPPER & CENTRAL VENOGRAPHY;  Surgeon: Cephus Shelling, MD;  [facility];  Service: Cardiovascular;  Laterality: N/A;     reports that she has never smoked. She has never used smokeless tobacco. She reports that she does not drink alcohol and does not use drugs.  Allergies  Allergen Reactions   Ace Inhibitors Other (See Comments) and Cough    Tongue swelling Angioedema    Angiotensin Receptor Blockers Other (See Comments)    Angioedema with ACE-I   Penicillins Other (See Comments)    Unknown reaction    Family History  Problem Relation Age of Onset   Multiple myeloma Sister    Brain cancer Sister    Dementia Mother        died at 68   Stroke Mother    Heart  failure Mother    Diabetes Mother    Heart disease Father    Prostate cancer Brother    Colon cancer Neg Hx     Prior to Admission medications   Medication Sig Start Date End Date Taking? Authorizing Provider  acetaminophen (TYLENOL) 325 MG tablet Take 650 mg by mouth 3 (three) times daily.    [provider]  acyclovir (ZOVIRAX) 200 MG capsule Take 200 mg by mouth in the morning. (0800) 10/20/19   [provider]  anastrozole (ARIMIDEX) 1 MG tablet TAKE 1 TABLET BY MOUTH DAILY 11/05/20   [name], MD  Calcium Carb-Cholecalciferol (CALCIUM + VITAMIN D3) 500-5 MG-MCG TABS Take 1 tablet by mouth daily.    [provider]  melatonin 5 MG TABS Take 5 mg by mouth at bedtime.    [provider]  multivitamin (RENA-VIT) TABS tablet Take 1 tablet by mouth at bedtime.    [provider]  Nutritional Supplements (ENSURE ENLIVE PO) Take 237 mLs by mouth every evening.    [provider]  ondansetron (ZOFRAN-ODT) 4 MG disintegrating tablet Take 4 mg by mouth daily before breakfast. 01/01/22   [provider]  pantoprazole (PROTONIX) 40 MG tablet Take 40 mg by mouth 2 (two) times daily.    [provider]  sennosides-docusate sodium (SENOKOT-S) 8.6-50 MG tablet Take 1 tablet by mouth 2 (two) times daily.    [provider]  sertraline (ZOLOFT) 25 MG tablet Take 25 mg by mouth daily. 11/01/22   [provider]  sevelamer carbonate (RENVELA) 800 MG tablet Take 800 mg by mouth daily. 01/05/23   [provider]  sucralfate (CARAFATE) 1 GM/10ML suspension Take 1 g by mouth in the morning, at noon, in the evening, and at bedtime.    [provider]    Physical Exam: Vitals:   05/24/23 1221 05/24/23 1222 05/24/23 1230  BP: (!) 177/100  (!) 150/77  Pulse: 99  85  Resp: 20  (!) 21  Temp: 98.5 F (36.9 C)    TempSrc: Oral    SpO2: 95%  96%  Weight:  66 kg   Height:  4\' 8"  (1.422 m)     Constitutional: NAD, calm, comfortable Vitals:   05/24/23 1221 05/24/23 1222 05/24/23 1230  BP: (!) 177/100  (!) 150/77  Pulse: 99  85  Resp: 20  (!) 21  Temp: 98.5 F (36.9 C)    TempSrc: Oral    SpO2: 95%  96%  Weight:  66 kg   Height:  4\' 8"  (1.422 m)    Eyes: PERRL, lids and conjunctivae normal ENMT: Mucous membranes are moist.   Neck: normal, supple, no masses, no thyromegaly Respiratory: clear to auscultation bilaterally, no wheezing, no crackles. Normal respiratory effort. No accessory muscle use.  Cardiovascular: Regular rate and rhythm, no murmurs / rubs / gallops. No extremity edema.  Extremities warm. Abdomen: no tenderness, no masses palpated. No hepatosplenomegaly. Bowel sounds positive.  Musculoskeletal: No tenderness on palpation of the right knee, some slight fullness to the knee superiorly otherwise no obvious deformity.  She has pain only when she tries to flex her knee. Skin: no rashes, lesions,  ulcers. No induration Neurologic: No facial asymmetry, moving extremities spontaneously, speech fluent.  Psychiatric: Normal judgment and insight. Alert and oriented x 3. Normal mood.   Labs on Admission: I have personally reviewed following labs and imaging studies  CBC: Recent Labs  Lab 05/18/23 1313 05/24/23 1352  WBC 5.3 7.6  NEUTROABS 3.8 6.0  HGB 11.2* 11.0*  HCT 35.2* 34.3*  MCV 101.7* 101.5*  PLT 223 209   Basic Metabolic Panel: Recent Labs  Lab 05/18/23 1313 05/24/23 1352  NA 136 137  K 3.4* 4.3  CL 95* 95*  CO2 25 23  GLUCOSE 195* 95  BUN 28* 42*  CREATININE 6.77* 9.50*  CALCIUM 9.3 9.6  MG 2.4 2.7*   GFR: Estimated Creatinine Clearance: 3.3 mL/min (A) (by C-G formula based on SCr of 9.5 mg/dL (H)). Liver Function Tests: Recent Labs  Lab 05/18/23 1313 05/24/23 1352  AST 17 28  ALT 11 14  ALKPHOS 82 80  BILITOT 0.4 0.5  PROT 7.4 7.1  ALBUMIN 3.6 3.7   Radiological Exams on Admission: DG Knee Complete 4 Views Right Result Date: 05/24/2023 CLINICAL DATA:  Right knee pain status post fall EXAM: RIGHT KNEE - COMPLETE 4 VIEW COMPARISON:  None Available. FINDINGS: Partially imaged right femoral fixation hardware appears intact. No evidence of fracture, dislocation, or joint effusion. Mild tricompartmental degenerative changes of the knee. Soft tissues are unremarkable. Vascular calcifications. IMPRESSION: No acute displaced fracture or dislocation. Electronically Signed   By: Agustin Cree M.D.   On: 05/24/2023 14:57   DG Hip Unilat W or Wo Pelvis 2-3 Views Right Result Date: 05/24/2023 CLINICAL DATA:  Right knee pain, status post fall. EXAM: DG HIP (WITH OR WITHOUT PELVIS) 2-3V RIGHT COMPARISON:  None Available. FINDINGS: Pelvis is intact with normal and symmetric sacroiliac joints. No acute fracture or dislocation. There is coarse trabecular pattern of the right iliac bone, favored to represent underlying Paget's disease. Right femur ORIF metallic hardware noted.  Visualized sacral arcuate lines are unremarkable. There are mild degenerative changes of bilateral hip joints characterized by mild-to-moderate joint space narrowing and osteophytosis of the superior acetabulum. No radiopaque foreign bodies. There is stable several well-circumscribed calcific densities overlying the right side of the pelvis, likely representing calcified leiomyomas. IMPRESSION: 1. No acute osseous abnormality of the pelvis or right hip joint. 2. Mild degenerative changes of bilateral hip joints. 3. Paget's disease of the right iliac bone. Electronically Signed   By: Jules Schick M.D.   On: 05/24/2023 14:54    EKG: Independently reviewed.  Sinus rhythm, rate 84, QTc 523.  Nonspecific T wave abnormalities in lead to 3 aVF, T wave inversions in V4 through V6.  Assessment/Plan Principal Problem:   Right knee pain Active Problems:   Elevated troponin   Multiple myeloma (HCC)   Hypertension due to end stage renal disease caused by type 2 diabetes mellitus, on dialysis [facility])   Vascular dementia with behavior disturbance (HCC)   Dependence on renal dialysis (HCC)   Type 2 diabetes mellitus with hypertension and end stage renal disease on dialysis (HCC)   Breast cancer (HCC)  Assessment and Plan: * Elevated troponin Patient reported syncope which was not confirmed by nursing home staff.  EKG showing T wave changes in V4 through V6, nonspecific T wave abnormalities in 2 3 aVF , hence troponin checked-elevated -2172 >> 3755.  She is without chest pain or difficulty breathing.  I re-evaluated her -is calm no sign of distress. -Trend troponin -I spoke to cardiology fellow on-call Dr. Lendell Caprice, patient can stay here tonight, cardiology team to evaluate in the morning, aspirin, echo, heparin drip.  Right knee pain Complains of right knee pain today.  Exam is unremarkable, no tenderness, she has knee pain only with movement of the knee.  Was admitted for syncope following patient's  report,  before full details could be obtained from nursing home.  Nursing home staff denies falls or syncope.  Patient has vascular dementia with behavioral disturbance, per nursing home staff baseline she has episodes of confusion.   -Right knee x-ray-no acute displaced fracture or dislocation. -Check orthostatic vitals  Breast cancer (HCC) Resume anastrozole  Type 2 diabetes mellitus with hypertension and end stage renal disease on dialysis [facility]) Not on medication. -Daily fasting CBG  Dependence on renal dialysis [facility]) Dialysis schedule Monday Wednesday Friday.  She did not get dialysis today. - Pls consult nephrology in a.m. for HD -Resume renal meds  Hypertension due to end stage renal disease caused by type 2 diabetes mellitus, on dialysis (HCC) Stable. - Obtain orthostatic vitals.  Multiple myeloma (HCC) Follows with Dr. Ellin Saba.  On Velcade every other week.   CRITICAL CARE Performed by: Onnie Boer   Total critical care time: 70  minutes  Critical care time was exclusive of separately billable procedures and treating other patients.  Critical care was necessary to treat or prevent imminent or life-threatening deterioration.  Critical care was time spent personally by me on the following activities: development of treatment plan with patient and/or surrogate as well as nursing, discussions with consultants, evaluation of patient's response to treatment, examination of patient, obtaining history from patient or surrogate, ordering and performing treatments and interventions, ordering and review of laboratory studies, ordering and review of radiographic studies, pulse oximetry and re-evaluation of patient's condition.   DVT prophylaxis: Heparin Code Status: DNR-confirmed with nursing home, ACP documents reviewed.  Universal DNR form on file. Family Communication: None at bedside Disposition Plan: ~ 2 days Consults called: Nephrology in a.m. Admission status:  Inpt  Tele    Author: Onnie Boer, MD 05/24/2023 11:13 PM  For on call review [email or web address].

## 2023-05-24 NOTE — Assessment & Plan Note (Addendum)
 Complains of right knee pain today.  Exam is unremarkable, no tenderness, she has knee pain only with movement of the knee.  Was admitted for syncope following patient's report, before full details could be obtained from nursing home.  Nursing home staff denies falls or syncope.  Patient has vascular dementia with behavioral disturbance, per nursing home staff baseline she has episodes of confusion.   -Right knee x-ray-no acute displaced fracture or dislocation. -Check orthostatic vitals

## 2023-05-24 NOTE — Assessment & Plan Note (Addendum)
 Dialysis schedule Monday Wednesday Friday.  She did not get dialysis today. - Pls consult nephrology in a.m. for HD -Resume renal meds

## 2023-05-24 NOTE — ED Provider Notes (Signed)
 [place] EMERGENCY DEPARTMENT AT [facility] Provider Note   CSN: [id number] Arrival date & time: 05/24/23  1204     History  Chief Complaint  Patient presents with   Knee Pain    Carly Wood is a 88 y.o. female.  88 year old female with a history of breast cancer, diabetes, hypertension, mild to moderate aortic stenosis, and multiple myeloma who presents emergency department after syncopal episode.  Patient got dialysis earlier today.  Went back to the [facility] and passed out.  Did strike her right knee and has been complaining of right knee pain since the fall.  Patient cannot tell me what happened.  Denies any headache or vomiting.  Unable to reach the [facility] for additional information.       Home Medications Prior to Admission medications   Medication Sig Start Date End Date Taking? Authorizing Provider  acetaminophen (TYLENOL) 325 MG tablet Take 650 mg by mouth 3 (three) times daily.    [provider]  acyclovir (ZOVIRAX) 200 MG capsule Take 200 mg by mouth in the morning. (0800) 10/20/19   [provider]  anastrozole (ARIMIDEX) 1 MG tablet TAKE 1 TABLET BY MOUTH DAILY 11/05/20   [name], MD  Calcium Carb-Cholecalciferol (CALCIUM + VITAMIN D3) 500-5 MG-MCG TABS Take 1 tablet by mouth daily.    [provider]  melatonin 5 MG TABS Take 5 mg by mouth at bedtime.    [provider]  multivitamin (RENA-VIT) TABS tablet Take 1 tablet by mouth at bedtime.    [provider]  Nutritional Supplements (ENSURE ENLIVE PO) Take 237 mLs by mouth every evening.    [provider]  ondansetron (ZOFRAN-ODT) 4 MG disintegrating tablet Take 4 mg by mouth daily before breakfast. 01/01/22   [provider]  oseltamivir (TAMIFLU) 30 MG capsule Take 30 mg by mouth daily. 05/17/23   [provider]  pantoprazole (PROTONIX) 40 MG tablet Take 40 mg by mouth 2 (two) times daily.    [provider]  sennosides-docusate sodium (SENOKOT-S) 8.6-50 MG tablet Take 1 tablet by mouth 2 (two) times daily.    [provider]  sertraline (ZOLOFT) 25 MG tablet Take 25 mg by mouth daily. 11/01/22   [provider]  sevelamer carbonate (RENVELA) 800 MG tablet Take 800 mg by mouth daily. 01/05/23   [provider]  sucralfate (CARAFATE) 1 GM/10ML suspension Take 1 g by mouth in the morning, at noon, in the evening, and at bedtime.    [provider]      Allergies    Ace inhibitors, Angiotensin receptor blockers, and Penicillins    Review of Systems   Review of Systems  Physical Exam Updated Vital Signs BP (!) 153/72 (BP Location: Right Arm)   Pulse 80   Temp 98.4 F (36.9 C) (Oral)   Resp (!) 21   Ht 4\' 8"  (1.422 m)   Wt 66 kg   SpO2 97%   BMI 32.62 kg/m  Physical Exam Vitals and nursing note reviewed.  Constitutional:      General: She is not in acute distress.    Appearance: She is well-developed.  HENT:     Head: Normocephalic and atraumatic.     Right Ear: External ear normal.     Left Ear: External ear normal.     Nose: Nose normal.  Eyes:     Extraocular Movements: Extraocular movements intact.     Conjunctiva/sclera: Conjunctivae normal.  Pupils: Pupils are equal, round, and reactive to light.     Comments: 4 mm bilaterally  Neck:     Comments: No C-spine midline tenderness to palpation Cardiovascular:     Rate and Rhythm: Normal rate and regular rhythm.     Heart sounds: Murmur (4/6 right sternal border) heard.     Comments: Tunneled HD catheter left chest wall Pulmonary:     Effort: Pulmonary effort is normal. No respiratory distress.     Breath sounds: Normal breath sounds.  Musculoskeletal:     Cervical back: Normal range of motion and neck supple.     Right lower leg: Edema present.     Left lower leg: Edema present.     Comments: Tenderness to palpation of right patella and medial aspect of right knee.  No  obvious deformity.  No tenderness palpation of bilateral shoulders, elbows, wrists, hips, or ankles.  DP pulses 2+ bilaterally.  Skin:    General: Skin is warm and dry.  Neurological:     Mental Status: She is alert and oriented to person, place, and time. Mental status is at baseline.     Comments: Moves all 4 extremities  Psychiatric:        Mood and Affect: Mood normal.     ED Results / Procedures / Treatments   Labs (all labs ordered are listed, but only abnormal results are displayed) Labs Reviewed  CBC WITH DIFFERENTIAL/PLATELET - Abnormal; Notable for the following components:      Result Value   RBC 3.38 (*)    Hemoglobin 11.0 (*)    HCT 34.3 (*)    MCV 101.5 (*)    All other components within normal limits  COMPREHENSIVE METABOLIC PANEL - Abnormal; Notable for the following components:   Chloride 95 (*)    BUN 42 (*)    Creatinine, Ser 9.50 (*)    GFR, Estimated 4 (*)    Anion gap 19 (*)    All other components within normal limits  MAGNESIUM - Abnormal; Notable for the following components:   Magnesium 2.7 (*)    All other components within normal limits  MRSA NEXT GEN BY PCR, NASAL  TROPONIN I (HIGH SENSITIVITY)  TROPONIN I (HIGH SENSITIVITY)    EKG EKG Interpretation Date/Time:  Monday May 24 2023 13:17:48 EDT Ventricular Rate:  84 PR Interval:  170 QRS Duration:  86 QT Interval:  442 QTC Calculation: 523 R Axis:   15  Text Interpretation: Sinus rhythm Ventricular premature complex Nonspecific T abnormalities, anterior leads Prolonged QT interval Baseline wander in lead(s) V6 Confirmed by Vonita Moss [id number]) on 05/24/2023 3:01:36 PM  Radiology DG Knee Complete 4 Views Right Result Date: 05/24/2023 CLINICAL DATA:  Right knee pain status post fall EXAM: RIGHT KNEE - COMPLETE 4 VIEW COMPARISON:  None Available. FINDINGS: Partially imaged right femoral fixation hardware appears intact. No evidence of fracture, dislocation, or joint effusion. Mild  tricompartmental degenerative changes of the knee. Soft tissues are unremarkable. Vascular calcifications. IMPRESSION: No acute displaced fracture or dislocation. Electronically Signed   By: Agustin Cree M.D.   On: 05/24/2023 14:57   DG Hip Unilat W or Wo Pelvis 2-3 Views Right Result Date: 05/24/2023 CLINICAL DATA:  Right knee pain, status post fall. EXAM: DG HIP (WITH OR WITHOUT PELVIS) 2-3V RIGHT COMPARISON:  None Available. FINDINGS: Pelvis is intact with normal and symmetric sacroiliac joints. No acute fracture or dislocation. There is coarse trabecular pattern of the right iliac bone, favored to  represent underlying Paget's disease. Right femur ORIF metallic hardware noted. Visualized sacral arcuate lines are unremarkable. There are mild degenerative changes of bilateral hip joints characterized by mild-to-moderate joint space narrowing and osteophytosis of the superior acetabulum. No radiopaque foreign bodies. There is stable several well-circumscribed calcific densities overlying the right side of the pelvis, likely representing calcified leiomyomas. IMPRESSION: 1. No acute osseous abnormality of the pelvis or right hip joint. 2. Mild degenerative changes of bilateral hip joints. 3. Paget's disease of the right iliac bone. Electronically Signed   By: Jules Schick M.D.   On: 05/24/2023 14:54    Procedures Procedures    Medications Ordered in ED Medications  heparin injection 5,000 Units (has no administration in time range)  acetaminophen (TYLENOL) tablet 650 mg (has no administration in time range)    Or  acetaminophen (TYLENOL) suppository 650 mg (has no administration in time range)  polyethylene glycol (MIRALAX / GLYCOLAX) packet 17 g (has no administration in time range)  calcium-vitamin D (OSCAL WITH D) 500-5 MG-MCG per tablet 1 tablet (has no administration in time range)  pantoprazole (PROTONIX) EC tablet 40 mg (has no administration in time range)  sevelamer carbonate (RENVELA)  tablet 800 mg (has no administration in time range)  anastrozole (ARIMIDEX) tablet 1 mg (has no administration in time range)    ED Course/ Medical Decision Making/ A&P Clinical Course as of 05/24/23 1928  Mon May 24, 2023  1533 Dr Mariea Clonts from hospitalist to admit the patient [RP]    Clinical Course User Index [RP] Rondel Baton, MD                                 Medical Decision Making Amount and/or Complexity of Data Reviewed Labs: ordered. Radiology: ordered.  Risk Decision regarding hospitalization.   EULLA KOCHANOWSKI is a 88 y.o. female with comorbidities that complicate the patient evaluation including breast cancer, diabetes, hypertension, mild to moderate aortic stenosis, and multiple myeloma who presents emergency department after syncopal episode.     Initial Ddx:  Syncope, aortic stenosis, arrhythmia, knee fracture, hip fracture, head injury, C-spine injury  MDM/Course:  Patient presents to the emergency department after losing consciousness.  No reported seizure-like activity.  Is complaining of knee pain.  On exam no obvious deformity of the knee.  Does have a loud systolic murmur at the right base which is suspect is from her aortic stenosis.  She is overall well-appearing and does not have any signs of head trauma.  Not complaining of a headache at this time.  Feel that head injury or C-spine injury is highly unlikely.  Knee x-rays without evidence of fracture.  Also had a hip x-ray at that did not show evidence of fracture.  EKG does show prolonged QT.  Upon re-evaluation patient was stable.  Feel that she is a high risk syncope patient and given her aortic stenosis and the fact that her EKG does show prolonged QT.  Will admit to hospitalist for further management.  This patient presents to the ED for concern of complaints listed in HPI, this involves an extensive number of treatment options, and is a complaint that carries with it a high risk of complications and  morbidity. Disposition including potential need for admission considered.   Dispo: Admit to Floor  Additional history obtained from EMS Records reviewed Outpatient Clinic Notes The following labs were independently interpreted: Chemistry and show CKD I independently  reviewed the following imaging with scope of interpretation limited to determining acute life threatening conditions related to emergency care: Extremity x-ray(s) and agree with the radiologist interpretation with the following exceptions: none I personally reviewed and interpreted cardiac monitoring: normal sinus rhythm  I personally reviewed and interpreted the pt's EKG: see above for interpretation  I have reviewed the patients home medications and made adjustments as needed Consults: Hospitalist Social Determinants of health:  Geriatric  Portions of this note were generated with Scientist, clinical (histocompatibility and immunogenetics). Dictation errors may occur despite best attempts at proofreading.     Final Clinical Impression(s) / ED Diagnoses Final diagnoses:  Acute pain of right knee  Loss of consciousness (HCC)  Prolonged Q-T interval on ECG  Aortic valve stenosis, etiology of cardiac valve disease unspecified    Rx / DC Orders ED Discharge Orders     None         Rondel Baton, MD 05/24/23 1928

## 2023-05-24 NOTE — Assessment & Plan Note (Signed)
Not on medication. -Daily fasting CBG

## 2023-05-24 NOTE — Care Management Obs Status (Signed)
 MEDICARE OBSERVATION STATUS NOTIFICATION   Patient Details  Name: [name] MRN: [id number] Date of Birth: [birth year]   Medicare Observation Status Notification Given:  Yes    Barron Alvine, RN 05/24/2023, 8:12 PM

## 2023-05-24 NOTE — Assessment & Plan Note (Signed)
 Follows with Dr. Ellin Saba.  On Velcade every other week.

## 2023-05-24 NOTE — Progress Notes (Incomplete)
 [facility] [address] [place] [address]    Clinic Day:  05/24/23   Referring physician: Sharee Holster, NP  Patient Care Team: Carly Holster, NP as PCP - General (Geriatric Medicine) Carly Wood, Carly Friends, MD as Consulting Physician (Gastroenterology) Carly Massed, MD as Medical Oncologist (Medical Oncology) Salomon Mast, MD (Inactive) as Consulting Physician (Nephrology) [facility])   ASSESSMENT & PLAN:   Assessment: 1.  IgA lambda plasma cell myeloma, stage II, standard risk: -BMBX on 09/20/2018 shows plasma cell myeloma, 30% plasma cells.  FISH panel was normal.  Chromosome analysis normal. -Labs at diagnosis on 08/29/2018 with M spike 0.9 g.  Kappa light chains 148, lambda light chains 1132, ratio 0.13.  LDH normal.  Beta-2 microglobulin 13.5.  She had transfusion dependent anemia. -Velcade and dexamethasone started on 10/11/2018. -Myeloma panel on 07/11/2019 shows SPEP is negative.  Free light chain ratio improved to 1.09.  Lambda light chains are 128, improved from 141.  Kappa light chains are 140. -She has been resident at [facility] since 10/12/2019.   2.  Stage I IDC of the right breast, ER/PR positive, HER-2 negative: -She is on anastrozole.   Plan: 1.  IgA lambda plasma cell myeloma: - She is tolerating Velcade every other week reasonably well.  She takes dexamethasone 10 mg on days of Velcade. - Reviewed labs from 03/30/2023: M spike increased to 0.4 g from 0.3 g.  FLC ratio is 0.39 and stable.  Kappa light chain is increased to 599 from 523. - She reports her fingers feel numb at nighttime.  No neuropathic pains.  No numbness or tingling in the feet. - We discussed option of intensifying Velcade to weekly regimen.  Will pay close attention to worsening neuropathy. - RTC 6 weeks with repeat myeloma labs 1 week prior.  If no response, consider adding low-dose Revlimid 5 mg.   2.  ESRD on HD: -  Continue HD on Monday, Wednesday and Friday.   3.  Bone strengthening: - Bisphosphonates not started as she is on dialysis.   4.  Right breast cancer: - Continue anastrozole daily.  No side effects from it.  No orders of the defined types were placed in this encounter.      Carly Wood,acting as a Neurosurgeon for [employer], MD.,have documented all relevant documentation on the behalf of Carly Massed, MD,as directed by  Carly Massed, MD while in the presence of Carly Massed, MD.  ***    Carly Wood   3/17/20252:45 PM  CHIEF COMPLAINT:   Diagnosis: plasma cell myeloma and right breast cancer    Cancer Staging  Stage 1 infiltrating ductal carcinoma of right Wood breast [facility]) Staging form: Breast, AJCC 7th Edition - Clinical stage from 08/30/2015: Stage IA (T1c, N0, M0) - Signed by Carly Newer, PA-C on 08/30/2015    Prior Therapy: none  Current Therapy:  maintenance Velcade & Decadron 3/4 weeks; anastrozole   HISTORY OF PRESENT ILLNESS:   Oncology History  Stage 1 infiltrating ductal carcinoma of right Wood breast (HCC)  09/12/2014 Mammogram   Mass in upper outer R breast, middle third depth appears slightly larger than it was on prior exam with more irreg spiculated margins   10/02/2014 Pathology Results   biopsy with invasive ductal carcinoma high grade 1.1 cm, dcis solid type. additional R breast tissue, excision, invasive ductal high grade 0.9 cm    10/24/2014 Pathology Results   no  residual invasive carcinoma, DCIS, focal, atypical ductal hyperplasia, 0/7 LN positive for metastatic carcinoma ER > 90%, PR 30%, HER 2 2+   10/24/2014 Cancer Staging   T1cN0M0   10/24/2014 Surgery   R mastectomy, T1c, N0   12/28/2014 - 11/22/2015 Chemotherapy   Taxol/herceptin weekly X 12, Herceptin every 21 days, last due in September 2017   03/11/2015 -  Anti-estrogen oral therapy   Arimidex 1 mg daily   09/12/2015 Imaging   MUGA- The left  ventricular ejection fraction equals 68%.   06/08/2016 Treatment Plan Change   Started Nerlynx   Multiple myeloma (HCC)  10/04/2018 Initial Diagnosis   Multiple myeloma (HCC)   10/11/2018 - 10/28/2021 Chemotherapy   Patient is on Treatment Plan : MYELOMA NON-TRANSPLANT CANDIDATES VRd weekly d21d      11/25/2021 -  Chemotherapy   Patient is on Treatment Plan : MYELOMA MAINTENANCE Bortezomib SQ q7d        INTERVAL HISTORY:   Carly Wood is a 88 y.o. Wood presenting to clinic today for follow up of plasma cell myeloma and breast cancer. She was last seen by me on 04/13/23.  Today, she states that she is doing well overall. Her appetite level is at ***%. Her energy level is at ***%.  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   Dysphagia    Dyspnea    with activity   GERD (gastroesophageal reflux disease)    Glaucoma    Hyperlipidemia    Hypertension    Multiple myeloma (HCC)    Pneumonia    Stage 1 infiltrating ductal carcinoma of right Wood breast (HCC) 08/21/2015   ER+ PR+ HER 2 neu + (3+) T1cN0     Surgical History: Past Surgical History:  Procedure Laterality Date   A/V FISTULAGRAM Right 07/05/2020   Procedure: A/V Fistulagram;  Surgeon: Sherren Kerns, MD;  [facility];  Service: Cardiovascular;  Laterality: Right;   AV FISTULA PLACEMENT Left 11/22/2017   Procedure: ARTERIOVENOUS (AV) FISTULA CREATION LEFT ARM;  Surgeon: Sherren Kerns, MD;  Location: [facility] OR;  Service: Vascular;  Laterality: Left;   AV FISTULA  PLACEMENT Right 04/04/2020   Procedure: RIGHT ARM ARTERIOVENOUS FISTULA CREATION;  Surgeon: Larina Earthly, MD;  Location: [facility];  Service: Vascular;  Laterality: Right;   AV FISTULA PLACEMENT Right 08/20/2020   Procedure: ARTERIOVENOUS (AV) FISTULA LIGATION RIGHT ARM;  Surgeon: Sherren Kerns, MD;  Location: [facility] OR;  Service: Vascular;  Laterality: Right;   BIOPSY  08/07/2016   Procedure: BIOPSY;  Surgeon: Corbin Ade, MD;  [facility]: [facility];  Service: Endoscopy;;  gastric ulcer biopsy   COLONOSCOPY     ESOPHAGOGASTRODUODENOSCOPY N/A 08/07/2016   LA Grade A esophagitis s/p dilation, small hiatal hernia, multiple gastric ulcers and erosions, duodenal erosions s/p biopsy. Negative H.pylori    ESOPHAGOGASTRODUODENOSCOPY N/A 11/27/2016   normal esophagus, previously noted gastric ulcers completely healed, normal duodenum.    ESOPHAGOGASTRODUODENOSCOPY (EGD) WITH PROPOFOL N/A 07/23/2020  Procedure: ESOPHAGOGASTRODUODENOSCOPY (EGD) WITH PROPOFOL;  Surgeon: Malissa Hippo, MD;  [facility];  Service: Endoscopy;  Laterality: N/A;   FISTULA SUPERFICIALIZATION Left 02/14/2018   Procedure: FISTULA SUPERFICIALIZATION LEFT ARM;  Surgeon: Chuck Hint, MD;  Location: [facility] OR;  Service: Vascular;  Laterality: Left;   FRACTURE SURGERY Right    ankle   HOT HEMOSTASIS  07/23/2020   Procedure: HOT HEMOSTASIS (ARGON PLASMA COAGULATION/BICAP);  Surgeon: Malissa Hippo, MD;  [facility];  Service: Endoscopy;;   INTRAMEDULLARY (IM) NAIL INTERTROCHANTERIC Right 07/12/2020   Procedure: INTRAMEDULLARY (IM) NAIL INTERTROCHANTRIC;  Surgeon: Oliver Barre, MD;  Location: [facility];  Service: Orthopedics;  Laterality: Right;   IR FLUORO GUIDE CV LINE LEFT  12/21/2022   IR US GUIDE VASC ACCESS LEFT  12/21/2022   MALONEY DILATION N/A 08/07/2016   Procedure: Elease Hashimoto DILATION;  Surgeon: Corbin Ade, MD;  [facility];  Service: Endoscopy;  Laterality: N/A;    MASTECTOMY, PARTIAL Right    PERIPHERAL VASCULAR BALLOON ANGIOPLASTY Left 07/13/2019   Procedure: PERIPHERAL VASCULAR BALLOON ANGIOPLASTY;  Surgeon: Cephus Shelling, MD;  [facility];  Service: Cardiovascular;  Laterality: Left;  arm fistulogram   PERIPHERAL VASCULAR BALLOON ANGIOPLASTY Right 05/22/2020   Procedure: PERIPHERAL VASCULAR BALLOON ANGIOPLASTY;  Surgeon: Leonie Douglas, MD;  [facility];  Service: Cardiovascular;  Laterality: Right;  arm fistula   PERIPHERAL VASCULAR BALLOON ANGIOPLASTY Right 07/05/2020   Procedure: PERIPHERAL VASCULAR BALLOON ANGIOPLASTY;  Surgeon: Sherren Kerns, MD;  [facility];  Service: Cardiovascular;  Laterality: Right;  arm fistula   PORT-A-CATH REMOVAL Left 11/22/2017   Procedure: REMOVAL PORT-A-CATH LEFT CHEST;  Surgeon: Sherren Kerns, MD;  Location: [facility] OR;  Service: Vascular;  Laterality: Left;   RETINAL DETACHMENT SURGERY Right    SCLEROTHERAPY  07/23/2020   Procedure: SCLEROTHERAPY;  Surgeon: Malissa Hippo, MD;  [facility];  Service: Endoscopy;;   UPPER EXTREMITY VENOGRAPHY N/A 08/22/2020   Procedure: LEFT UPPER & CENTRAL VENOGRAPHY;  Surgeon: Cephus Shelling, MD;  [facility];  Service: Cardiovascular;  Laterality: N/A;    Social History: Social History   Socioeconomic History   Marital status: Single    Spouse name: Not on file   Number of children: Not on file   Years of education: Not on file   Highest education level: Not on file  Occupational History   Occupation: retired   Tobacco Use   Smoking status: Never   Smokeless tobacco: Never  Vaping Use   Vaping status: Never Used  Substance and Sexual Activity   Alcohol use: No    Alcohol/week: 0.0 standard drinks of alcohol   Drug use: No   Sexual activity: Never  Other Topics Concern   Not on file  Social History Narrative   Long term resident of SNF    Social Drivers of Health    Financial Resource Strain: Low Risk  (01/09/2020)   Overall Financial Resource Strain (CARDIA)    Difficulty of Paying Living Expenses: Not very hard  Food Insecurity: No Food Insecurity (12/16/2022)   Hunger Vital Sign    Worried About Running Out of Food in the Last Year: Never true    Ran Out of Food in the Last Year: Never true  Transportation Needs: No Transportation Needs (12/16/2022)   PRAPARE - Transportation    Lack of Transportation (Medical): No    Lack  of Transportation (Non-Medical): No  Physical Activity: Inactive (01/09/2020)   Exercise Vital Sign    Days of Exercise per Week: 0 days    Minutes of Exercise per Session: 0 min  Stress: No Stress Concern Present (01/09/2020)   [employer] of Occupational Health - Occupational Stress Questionnaire    Feeling of Stress : Not at all  Social Connections: Moderately Isolated (01/09/2020)   Social Connection and Isolation Panel [NHANES]    Frequency of Communication with Wood and Family: More than three times a week    Frequency of Social Gatherings with Wood and Family: Once a week    Attends Religious Services: More than 4 times per year    Active Member of [employer] or Organizations: No    Attends Banker Meetings: Never    Marital Status: Never married  Intimate Partner Violence: Not At Risk (12/16/2022)   Humiliation, Afraid, Rape, and Kick questionnaire    Fear of Current or Ex-Partner: No    Emotionally Abused: No    Physically Abused: No    Sexually Abused: No    Family History: Family History  Problem Relation Age of Onset   Multiple myeloma Sister    Brain cancer Sister    Dementia Mother        died at 38   Stroke Mother    Heart failure Mother    Diabetes Mother    Heart disease Father    Prostate cancer Brother    Colon cancer Neg Hx     Current Medications: No current facility-administered medications for this visit.  Current Outpatient Medications:    acetaminophen (TYLENOL)  325 MG tablet, Take 650 mg by mouth 3 (three) times daily., Disp: , Rfl:    acyclovir (ZOVIRAX) 200 MG capsule, Take 200 mg by mouth in the morning. (0800), Disp: , Rfl:    anastrozole (ARIMIDEX) 1 MG tablet, TAKE 1 TABLET BY MOUTH DAILY, Disp: 30 tablet, Rfl: 6   Calcium Carb-Cholecalciferol (CALCIUM + VITAMIN D3) 500-5 MG-MCG TABS, Take 1 tablet by mouth daily., Disp: , Rfl:    melatonin 5 MG TABS, Take 5 mg by mouth at bedtime., Disp: , Rfl:    multivitamin (RENA-VIT) TABS tablet, Take 1 tablet by mouth at bedtime., Disp: , Rfl:    Nutritional Supplements (ENSURE ENLIVE PO), Take 237 mLs by mouth every evening., Disp: , Rfl:    ondansetron (ZOFRAN-ODT) 4 MG disintegrating tablet, Take 4 mg by mouth daily before breakfast., Disp: , Rfl:    pantoprazole (PROTONIX) 40 MG tablet, Take 40 mg by mouth 2 (two) times daily., Disp: , Rfl:    sennosides-docusate sodium (SENOKOT-S) 8.6-50 MG tablet, Take 1 tablet by mouth 2 (two) times daily., Disp: , Rfl:    sertraline (ZOLOFT) 25 MG tablet, Take 25 mg by mouth daily., Disp: , Rfl:    sevelamer carbonate (RENVELA) 800 MG tablet, Take 800 mg by mouth daily., Disp: , Rfl:    sucralfate (CARAFATE) 1 GM/10ML suspension, Take 1 g by mouth in the morning, at noon, in the evening, and at bedtime., Disp: , Rfl:   Facility-Administered Medications Ordered in Other Visits:    lanreotide acetate (SOMATULINE DEPOT) 120 MG/0.5ML injection, , , ,    lanreotide acetate (SOMATULINE DEPOT) 120 MG/0.5ML injection, , , ,    lanreotide acetate (SOMATULINE DEPOT) 120 MG/0.5ML injection, , , ,    lanreotide acetate (SOMATULINE DEPOT) 120 MG/0.5ML injection, , , ,    octreotide (SANDOSTATIN LAR) 30 MG  IM injection, , , ,    octreotide (SANDOSTATIN LAR) 30 MG IM injection, , , ,    Allergies: Allergies  Allergen Reactions   Ace Inhibitors Other (See Comments) and Cough    Tongue swelling Angioedema    Angiotensin Receptor Blockers Other (See Comments)    Angioedema  with ACE-I   Penicillins Other (See Comments)    Unknown reaction    REVIEW OF SYSTEMS:   Review of Systems  Constitutional:  Negative for chills, fatigue and fever.  HENT:   Negative for lump/mass, mouth sores, nosebleeds, sore throat and trouble swallowing.   Eyes:  Negative for eye problems.  Respiratory:  Negative for cough and shortness of breath.   Cardiovascular:  Negative for chest pain, leg swelling and palpitations.  Gastrointestinal:  Negative for abdominal pain, constipation, diarrhea, nausea and vomiting.  Genitourinary:  Negative for bladder incontinence, difficulty urinating, dysuria, frequency, hematuria and nocturia.   Musculoskeletal:  Negative for arthralgias, back pain, flank pain, myalgias and neck pain.  Skin:  Negative for itching and rash.  Neurological:  Negative for dizziness, headaches and numbness.  Hematological:  Does not bruise/bleed easily.  Psychiatric/Behavioral:  Negative for depression, sleep disturbance and suicidal ideas. The patient is not nervous/anxious.   All other systems reviewed and are negative.    VITALS:   There were no vitals taken for this visit.  Wt Readings from Last 3 Encounters:  05/24/23 145 lb 7.7 oz (66 kg)  05/11/23 145 lb 8 oz (66 kg)  05/04/23 146 lb 6.4 oz (66.4 kg)    There is no height or weight on file to calculate BMI.  Performance status (ECOG): 1 - Symptomatic but completely ambulatory  PHYSICAL EXAM:   Physical Exam Vitals and nursing note reviewed. Exam conducted with a chaperone present.  Constitutional:      Appearance: Normal appearance.  Cardiovascular:     Rate and Rhythm: Normal rate and regular rhythm.     Pulses: Normal pulses.     Heart sounds: Normal heart sounds.  Pulmonary:     Effort: Pulmonary effort is normal.     Breath sounds: Normal breath sounds.  Abdominal:     Palpations: Abdomen is soft. There is no hepatomegaly, splenomegaly or mass.     Tenderness: There is no abdominal  tenderness.  Musculoskeletal:     Right lower leg: No edema.     Left lower leg: No edema.  Lymphadenopathy:     Cervical: No cervical adenopathy.     Right cervical: No superficial, deep or posterior cervical adenopathy.    Left cervical: No superficial, deep or posterior cervical adenopathy.     Upper Body:     Right upper body: No supraclavicular or axillary adenopathy.     Left upper body: No supraclavicular or axillary adenopathy.  Neurological:     General: No focal deficit present.     Mental Status: She is alert and oriented to person, place, and time.  Psychiatric:        Mood and Affect: Mood normal.        Behavior: Behavior normal.     LABS:      Latest Ref Rng & Units 05/24/2023    1:52 PM 05/18/2023    1:13 PM 05/11/2023    1:42 PM  CBC  WBC 4.0 - 10.5 K/uL 7.6  5.3  5.1   Hemoglobin 12.0 - 15.0 g/dL 16.1  09.6  04.5   Hematocrit 36.0 - 46.0 % 34.3  35.2  32.9   Platelets 150 - 400 K/uL 209  223  207       Latest Ref Rng & Units 05/24/2023    1:52 PM 05/18/2023    1:13 PM 05/11/2023    1:42 PM  CMP  Glucose 70 - 99 mg/dL 95  952  841   BUN 8 - 23 mg/dL 42  28  24   Creatinine 0.44 - 1.00 mg/dL 3.24  4.01  0.27   Sodium 135 - 145 mmol/L 137  136  132   Potassium 3.5 - 5.1 mmol/L 4.3  3.4  3.8   Chloride 98 - 111 mmol/L 95  95  93   CO2 22 - 32 mmol/L 23  25  25    Calcium 8.9 - 10.3 mg/dL 9.6  9.3  9.1   Total Protein 6.5 - 8.1 g/dL 7.1  7.4  7.2   Total Bilirubin 0.0 - 1.2 mg/dL 0.5  0.4  0.3   Alkaline Phos 38 - 126 U/L 80  82  78   AST 15 - 41 U/L 28  17  16    ALT 0 - 44 U/L 14  11  10       No results found for: "CEA1", "CEA" / No results found for: "CEA1", "CEA" No results found for: "PSA1" No results found for: "CAN199" No results found for: "CAN125"  Lab Results  Component Value Date   TOTALPROTELP 6.7 05/18/2023   TOTALPROTELP 6.7 05/18/2023   ALBUMINELP 3.6 05/18/2023   A1GS 0.3 05/18/2023   A2GS 0.8 05/18/2023   BETS 1.2 05/18/2023    GAMS 0.9 05/18/2023   MSPIKE Not Observed 05/18/2023   SPEI Comment 05/18/2023   Lab Results  Component Value Date   TIBC 110 (L) 12/18/2022   TIBC 140 (L) 12/16/2021   TIBC 142 (L) 08/12/2021   FERRITIN 277 12/18/2022   FERRITIN 272 12/16/2021   FERRITIN 312 (H) 08/12/2021   IRONPCTSAT 36 (H) 12/18/2022   IRONPCTSAT 39 (H) 12/16/2021   IRONPCTSAT 65 (H) 08/12/2021   Lab Results  Component Value Date   LDH 120 10/21/2021   LDH 129 10/14/2021   LDH 136 10/07/2020     STUDIES:   No results found.

## 2023-05-24 NOTE — Assessment & Plan Note (Signed)
Resume anastrozole

## 2023-05-24 NOTE — Assessment & Plan Note (Signed)
 Stable. - Obtain orthostatic vitals.

## 2023-05-24 NOTE — ED Triage Notes (Addendum)
 Pt BIB ems for rt knee pain starting (unsure). Pt states she fell at the [facility] after one of her dialysis appointment and she "unsure if she blacked out." Pt states pain on top of knee. Pt did not have her dialysis done b/c pt would not "quit complaining about her knee pain."

## 2023-05-24 NOTE — ED Notes (Addendum)
 Pt stated she fell recently after dialysis visit. Unsure of what happened after fall. Stated unable to sit/lay without pain.    Pt was put on bedpan and brief was changed as well. Pt has limited movement and swelling bilaterally in lower extremities. Pt stated legs and feet are always swollen.

## 2023-05-24 NOTE — Plan of Care (Signed)
   Problem: Education: Goal: Knowledge of General Education information will improve Description Including pain rating scale, medication(s)/side effects and non-pharmacologic comfort measures Outcome: Progressing

## 2023-05-24 NOTE — Progress Notes (Signed)
   05/24/23 2009  TOC Brief Assessment  Insurance and Status Reviewed  Patient has primary care physician Yes  Home environment has been reviewed From [facility].  Prior level of function: Assisted.  Social Drivers of Health Review SDOH reviewed no interventions necessary  Readmission risk has been reviewed Yes  Transition of care needs no transition of care needs at this time   From [facility]. Has wc and walker. TOC will continue to follow.

## 2023-05-25 ENCOUNTER — Inpatient Hospital Stay

## 2023-05-25 ENCOUNTER — Inpatient Hospital Stay: Payer: Medicare PPO

## 2023-05-25 ENCOUNTER — Ambulatory Visit: Payer: Medicare PPO | Admitting: Internal Medicine

## 2023-05-25 ENCOUNTER — Inpatient Hospital Stay (HOSPITAL_COMMUNITY)

## 2023-05-25 ENCOUNTER — Inpatient Hospital Stay: Payer: Medicare PPO | Admitting: Hematology

## 2023-05-25 ENCOUNTER — Other Ambulatory Visit (HOSPITAL_COMMUNITY): Payer: Self-pay | Admitting: *Deleted

## 2023-05-25 ENCOUNTER — Inpatient Hospital Stay: Admitting: Hematology

## 2023-05-25 DIAGNOSIS — R7989 Other specified abnormal findings of blood chemistry: Secondary | ICD-10-CM | POA: Diagnosis not present

## 2023-05-25 DIAGNOSIS — E1122 Type 2 diabetes mellitus with diabetic chronic kidney disease: Secondary | ICD-10-CM | POA: Diagnosis not present

## 2023-05-25 DIAGNOSIS — C9000 Multiple myeloma not having achieved remission: Secondary | ICD-10-CM | POA: Diagnosis not present

## 2023-05-25 DIAGNOSIS — F01518 Vascular dementia, unspecified severity, with other behavioral disturbance: Secondary | ICD-10-CM | POA: Diagnosis not present

## 2023-05-25 DIAGNOSIS — R011 Cardiac murmur, unspecified: Secondary | ICD-10-CM

## 2023-05-25 LAB — BASIC METABOLIC PANEL
Anion gap: 19 — ABNORMAL HIGH (ref 5–15)
BUN: 50 mg/dL — ABNORMAL HIGH (ref 8–23)
CO2: 23 mmol/L (ref 22–32)
Calcium: 9.6 mg/dL (ref 8.9–10.3)
Chloride: 96 mmol/L — ABNORMAL LOW (ref 98–111)
Creatinine, Ser: 10.62 mg/dL — ABNORMAL HIGH (ref 0.44–1.00)
GFR, Estimated: 3 mL/min — ABNORMAL LOW (ref >60)
Glucose, Bld: 75 mg/dL (ref 70–99)
Potassium: 4.2 mmol/L (ref 3.5–5.1)
Sodium: 138 mmol/L (ref 135–145)

## 2023-05-25 LAB — ECHOCARDIOGRAM COMPLETE
AR max vel: 1.16 cm2
AV Area VTI: 1.14 cm2
AV Area mean vel: 1.1 cm2
AV Mean grad: 11.5 mmHg
AV Peak grad: 21.3 mmHg
Ao pk vel: 2.31 m/s
Area-P 1/2: 4.8 cm2
Height: 56 in
MV VTI: 1.93 cm2
S' Lateral: 2.5 cm
Weight: 2327.7 [oz_av]

## 2023-05-25 LAB — CBC
HCT: 34 % — ABNORMAL LOW (ref 36.0–46.0)
Hemoglobin: 11 g/dL — ABNORMAL LOW (ref 12.0–15.0)
MCH: 32.6 pg (ref 26.0–34.0)
MCHC: 32.4 g/dL (ref 30.0–36.0)
MCV: 100.9 fL — ABNORMAL HIGH (ref 80.0–100.0)
Platelets: 190 10*3/uL (ref 150–400)
RBC: 3.37 MIL/uL — ABNORMAL LOW (ref 3.87–5.11)
RDW: 15.1 % (ref 11.5–15.5)
WBC: 5.6 10*3/uL (ref 4.0–10.5)
nRBC: 0 % (ref 0.0–0.2)

## 2023-05-25 LAB — HEPARIN LEVEL (UNFRACTIONATED)
Heparin Unfractionated: <0.1 [IU]/mL — ABNORMAL LOW (ref 0.30–0.70)
Heparin Unfractionated: 0.17 [IU]/mL — ABNORMAL LOW (ref 0.30–0.70)

## 2023-05-25 LAB — TROPONIN I (HIGH SENSITIVITY): Troponin I (High Sensitivity): 4099 ng/L (ref <18)

## 2023-05-25 MED ORDER — HEPARIN BOLUS VIA INFUSION
1550.0000 [IU] | Freq: Once | INTRAVENOUS | Status: AC
  Administered 2023-05-25: 1550 [IU] via INTRAVENOUS
  Filled 2023-05-25: qty 1550

## 2023-05-25 MED ORDER — CHLORHEXIDINE GLUCONATE CLOTH 2 % EX PADS
6.0000 | MEDICATED_PAD | Freq: Every day | CUTANEOUS | Status: DC
  Administered 2023-05-25 – 2023-05-26 (×2): 6 via TOPICAL

## 2023-05-25 MED ORDER — HEPARIN BOLUS VIA INFUSION
3000.0000 [IU] | Freq: Once | INTRAVENOUS | Status: AC
  Administered 2023-05-25: 3000 [IU] via INTRAVENOUS
  Filled 2023-05-25: qty 3000

## 2023-05-25 NOTE — Progress Notes (Signed)
 Date and time results received: 05/25/23 0741 (use smartphrase ".now" to insert current time)  Test: troponin Critical Value: 4,099  Name of Provider Notified: Dr. Laural Benes   Orders Received? Or Actions Taken?:

## 2023-05-25 NOTE — Progress Notes (Signed)
 PROGRESS NOTE   Carly Wood  ZOX:[id number] DOB: 01/21/1936 DOA: 05/24/2023 PCP: Sharee Holster, NP   Chief Complaint  Patient presents with   Knee Pain   Level of care: Telemetry  Brief Admission History:  88 year old female resident of [facility] with past medical history of ERSD on HD, multiple myeloma, breast cancer, diabetes mellitus, dementia started complaining of knee pain and refused to get up for dialysis and subsequently sent to the ED.  She had no other complaints of chest pain or shortness of breath.  No evidence that she passed out.  She was noted to have an elevated troponin and admitted with concern for NSTEMI.   Assessment and Plan:  NSTEMI -cardiology team to evaluated and planning 2D echocardiogram, continue aspirin, heparin drip, considering amlodipine or imdur on non HD days  Right knee pain -Right knee x-ray-no acute displaced fracture or dislocation.  Breast cancer Resume anastrozole  Type 2 diabetes mellitus with renal complications -diet controlled  ESRD on HD  Dialysis schedule Monday Wednesday Friday - nephrology consult requested for inpatient HD - resume renal meds  Hypertension due to end stage renal disease caused by type 2 diabetes mellitus, on dialysis  - following BP closely   Multiple myeloma  Follows with Dr. Ellin Saba.  On Velcade every other week.  DVT prophylaxis: heparin Code Status: DNR  Family Communication:  Disposition: SNF  Consultants:  Cardio nephrology Procedures:   Antimicrobials:    Subjective: Pt denies having CP and SOB symptoms  Objective: Vitals:   05/24/23 1712 05/24/23 2025 05/25/23 0447 05/25/23 1319  BP: (!) 153/72 (!) 163/85 (!) 178/94 (!) 179/94  Pulse: 80 79 77 83  Resp:      Temp: 98.4 F (36.9 C) 97.6 F (36.4 C) (!) 97.5 F (36.4 C) 97.8 F (36.6 C)  TempSrc: Oral Oral Oral   SpO2: 97% 97% 94% 95%  Weight:      Height:        Intake/Output Summary (Last 24 hours) at 05/25/2023  1646 Last data filed at 05/25/2023 1500 Gross per 24 hour  Intake 608.2 ml  Output --  Net 608.2 ml   Filed Weights   05/24/23 1222  Weight: 66 kg   Examination:  General exam: Appears calm and comfortable, chronically ill appearing, confusion noted.  Respiratory system: Clear to auscultation. Respiratory effort normal. Cardiovascular system: normal S1 & S2 heard.  Gastrointestinal system: Abdomen is nondistended, soft and nontender. No organomegaly or masses felt. Normal bowel sounds heard. Central nervous system: Alert and oriented to person and place. No focal neurological deficits. Extremities: Symmetric 5 x 5 power. Skin: No rashes, lesions or ulcers. Psychiatry: Judgement and insight appear diminished. Mood & affect appropriate.   Data Reviewed: I have personally reviewed following labs and imaging studies  CBC: Recent Labs  Lab 05/24/23 1352 05/25/23 0437  WBC 7.6 5.6  NEUTROABS 6.0  --   HGB 11.0* 11.0*  HCT 34.3* 34.0*  MCV 101.5* 100.9*  PLT 209 190    Basic Metabolic Panel: Recent Labs  Lab 05/24/23 1352 05/25/23 0437  NA 137 138  K 4.3 4.2  CL 95* 96*  CO2 23 23  GLUCOSE 95 75  BUN 42* 50*  CREATININE 9.50* 10.62*  CALCIUM 9.6 9.6  MG 2.7*  --     CBG: No results for input(s): "GLUCAP" in the last 168 hours.  Recent Results (from the past 240 hours)  Resp panel by RT-PCR (RSV, Flu A&B, Covid) Anterior  Nasal Swab     Status: None   Collection Time: 05/17/23  5:26 PM   Specimen: Anterior Nasal Swab  Result Value Ref Range Status   SARS Coronavirus 2 by RT PCR NEGATIVE NEGATIVE Final    Comment: (NOTE) SARS-CoV-2 target nucleic acids are NOT DETECTED.  The SARS-CoV-2 RNA is generally detectable in upper respiratory specimens during the acute phase of infection. The lowest concentration of SARS-CoV-2 viral copies this assay can detect is 138 copies/mL. A negative result does not preclude SARS-Cov-2 infection and should not be used as the  sole basis for treatment or other patient management decisions. A negative result may occur with  improper specimen collection/handling, submission of specimen other than nasopharyngeal swab, presence of viral mutation(s) within the areas targeted by this assay, and inadequate number of viral copies(<138 copies/mL). A negative result must be combined with clinical observations, patient history, and epidemiological information. The expected result is Negative.  Fact Sheet for Patients:  BloggerCourse.com  Fact Sheet for Healthcare Providers:  SeriousBroker.it  This test is no t yet approved or cleared by the [place] FDA and  has been authorized for detection and/or diagnosis of SARS-CoV-2 by FDA under an Emergency Use Authorization (EUA). This EUA will remain  in effect (meaning this test can be used) for the duration of the COVID-19 declaration under Section 564(b)(1) of the Act, 21 U.S.C.section 360bbb-3(b)(1), unless the authorization is terminated  or revoked sooner.       Influenza A by PCR NEGATIVE NEGATIVE Final   Influenza B by PCR NEGATIVE NEGATIVE Final    Comment: (NOTE) The Xpert Xpress SARS-CoV-2/FLU/RSV plus assay is intended as an aid in the diagnosis of influenza from Nasopharyngeal swab specimens and should not be used as a sole basis for treatment. Nasal washings and aspirates are unacceptable for Xpert Xpress SARS-CoV-2/FLU/RSV testing.  Fact Sheet for Patients: BloggerCourse.com  Fact Sheet for Healthcare Providers: SeriousBroker.it  This test is not yet approved or cleared by the [place] FDA and has been authorized for detection and/or diagnosis of SARS-CoV-2 by FDA under an Emergency Use Authorization (EUA). This EUA will remain in effect (meaning this test can be used) for the duration of the COVID-19 declaration under Section 564(b)(1) of the  Act, 21 U.S.C. section 360bbb-3(b)(1), unless the authorization is terminated or revoked.     Resp Syncytial Virus by PCR NEGATIVE NEGATIVE Final    Comment: (NOTE) Fact Sheet for Patients: BloggerCourse.com  Fact Sheet for Healthcare Providers: SeriousBroker.it  This test is not yet approved or cleared by the [place] FDA and has been authorized for detection and/or diagnosis of SARS-CoV-2 by FDA under an Emergency Use Authorization (EUA). This EUA will remain in effect (meaning this test can be used) for the duration of the COVID-19 declaration under Section 564(b)(1) of the Act, 21 U.S.C. section 360bbb-3(b)(1), unless the authorization is terminated or revoked.  Performed at [facility], [address], [place], [place] [address]   MRSA Next Gen by PCR, Nasal     Status: None   Collection Time: 05/24/23  6:45 PM   Specimen: Nasal Mucosa; Nasal Swab  Result Value Ref Range Status   MRSA by PCR Next Gen NOT DETECTED NOT DETECTED Final    Comment: (NOTE) The GeneXpert MRSA Assay (FDA approved for NASAL specimens only), is one component of a comprehensive MRSA colonization surveillance program. It is not intended to diagnose MRSA infection nor to guide or monitor treatment for MRSA infections. Test performance is  not FDA approved in patients less than 42 years old. Performed at [facility], [address], [place], [place] [address]      Radiology Studies: DG Knee Complete 4 Views Right Result Date: 05/24/2023 CLINICAL DATA:  Right knee pain status post fall EXAM: RIGHT KNEE - COMPLETE 4 VIEW COMPARISON:  None Available. FINDINGS: Partially imaged right femoral fixation hardware appears intact. No evidence of fracture, dislocation, or joint effusion. Mild tricompartmental degenerative changes of the knee. Soft tissues are unremarkable. Vascular calcifications. IMPRESSION: No acute displaced fracture or dislocation.  Electronically Signed   By: Agustin Cree M.D.   On: 05/24/2023 14:57   DG Hip Unilat W or Wo Pelvis 2-3 Views Right Result Date: 05/24/2023 CLINICAL DATA:  Right knee pain, status post fall. EXAM: DG HIP (WITH OR WITHOUT PELVIS) 2-3V RIGHT COMPARISON:  None Available. FINDINGS: Pelvis is intact with normal and symmetric sacroiliac joints. No acute fracture or dislocation. There is coarse trabecular pattern of the right iliac bone, favored to represent underlying Paget's disease. Right femur ORIF metallic hardware noted. Visualized sacral arcuate lines are unremarkable. There are mild degenerative changes of bilateral hip joints characterized by mild-to-moderate joint space narrowing and osteophytosis of the superior acetabulum. No radiopaque foreign bodies. There is stable several well-circumscribed calcific densities overlying the right side of the pelvis, likely representing calcified leiomyomas. IMPRESSION: 1. No acute osseous abnormality of the pelvis or right hip joint. 2. Mild degenerative changes of bilateral hip joints. 3. Paget's disease of the right iliac bone. Electronically Signed   By: Jules Schick M.D.   On: 05/24/2023 14:54    Scheduled Meds:  anastrozole  1 mg Oral Daily   aspirin EC  81 mg Oral Daily   atorvastatin  40 mg Oral Daily   calcium-vitamin D  1 tablet Oral Daily   Chlorhexidine Gluconate Cloth  6 each Topical Q0600   pantoprazole  40 mg Oral BID   sevelamer carbonate  800 mg Oral Q supper   Continuous Infusions:  heparin 800 Units/hr (05/25/23 0908)     LOS: 1 day   Time spent: 57 mins  Troy Kanouse Laural Benes, MD How to contact the [facility] Attending or Consulting provider 7A - 7P or covering provider during after hours 7P -7A, for this patient?  Check the care team in [facility] and look for a) attending/consulting [facility] provider listed and b) the [facility] team listed Log into [email or web address] to find provider on call.  Locate the [facility] provider you are looking for under [facility]  and page to a number that you can be directly reached. If you still have difficulty reaching the provider, please page the [facility] (Director on Call) for the Hospitalists listed on amion for assistance.  05/25/2023, 4:46 PM

## 2023-05-25 NOTE — Hospital Course (Addendum)
 88 year old female resident of [facility] with past medical history of ERSD on HD, multiple myeloma, breast cancer, diabetes mellitus, dementia started complaining of knee pain and refused to get up for dialysis and subsequently sent to the ED.  She had no other complaints of chest pain or shortness of breath.  No evidence that she passed out.  She was noted to have an elevated troponin and admitted with concern for NSTEMI. Troponins peaked at 4099.  Cardiology was consulted assist with management.  They did not feel the patient was a candidate for advanced therapy.  The patient was started on IV heparin for 48 hours. Regarding the patient's knee pain, it was difficult to clarify the chronicity.  Nevertheless, clinical suspicion for septic arthritis was extremely low.

## 2023-05-25 NOTE — Plan of Care (Signed)
  Problem: Education: Goal: Knowledge of General Education information will improve Description: Including pain rating scale, medication(s)/side effects and non-pharmacologic comfort measures Outcome: Progressing   Problem: Health Behavior/Discharge Planning: Goal: Ability to manage health-related needs will improve Outcome: Progressing   Problem: Clinical Measurements: Goal: Respiratory complications will improve Outcome: Progressing Goal: Cardiovascular complication will be avoided Outcome: Progressing   Problem: Activity: Goal: Risk for activity intolerance will decrease Outcome: Progressing   

## 2023-05-25 NOTE — Progress Notes (Incomplete)
 PHARMACY - ANTICOAGULATION CONSULT NOTE  Pharmacy Consult for heparin Indication: chest pain/ACS  Allergies  Allergen Reactions   Ace Inhibitors Other (See Comments) and Cough    Tongue swelling Angioedema    Angiotensin Receptor Blockers Other (See Comments)    Angioedema with ACE-I   Penicillins Other (See Comments)    Unknown reaction    Patient Measurements: Height: 4\' 8"  (142.2 cm) Weight: 66 kg (145 lb 7.7 oz) IBW/kg (Calculated) : 36.3 Heparin Dosing Weight: 52 kg  Vital Signs: Temp: 97.8 F (36.6 C) (03/18 1319) Temp Source: Oral (03/18 0447) BP: 179/94 (03/18 1319) Pulse Rate: 83 (03/18 1319)  Labs: Recent Labs    05/24/23 1352 05/24/23 1953 05/25/23 0437 05/25/23 0739  HGB 11.0*  --  11.0*  --   HCT 34.3*  --  34.0*  --   PLT 209  --  190  --   HEPARINUNFRC  --   --   --  <0.10*  CREATININE 9.50*  --  10.62*  --   TROPONINIHS 2,172* 3,755* 4,099*  --     Estimated Creatinine Clearance: 2.9 mL/min (A) (by C-G formula based on SCr of 10.62 mg/dL (H)).  Medications:  -No oral anticoagulation -Received 1 dose of subcutaneous heparin 5000 units ~2145  Assessment: 60 yoF presented to ED with right knee pain and syncope and found to to have elevated troponin at 2,172 without chest pain. Pharmacy consulted to dose heparin for ACS.  -Hgb 11, plts 190 -Trops 2172>>4099 -ESRD (HD MWF)  Initial heparin level undetectable on 600 units/hr. No bleeding or IV issues noted.   Goal of Therapy:  Heparin level 0.3-0.7 units/ml Monitor platelets by anticoagulation protocol: Yes   Plan:  Rebolus 3000 units x1 then increase rate to 800 units/hr Recheck heparin level this evening Follow up cardiology plan this am  Sheppard Coil PharmD., BCPS Clinical Pharmacist 05/25/2023 3:45 PM

## 2023-05-25 NOTE — Consult Note (Addendum)
 Cardiology Consultation   Patient ID: Carly Wood MRN: [id number]; DOB: 1935-04-12  Admit date: 05/24/2023 Date of Consult: 05/25/2023  PCP:  Sharee Holster, NP   [facility] Providers Cardiologist:  None        Patient Profile:   Carly Wood is a 88 y.o. female with a hx of ESRD, HTN, DM, multiple myleoma, breast CA, dementia who is being seen 05/25/2023 for the evaluation of elevated troponins at the request of Dr. Laural Benes.  History of Present Illness:   Carly Wood is an 88 yo who has a conflicting history who said she felt ill and wasn't sent to dialysis. She also reports falling and passing out the day before. MD contacted [name] and they said she didn't fall or pass out. She was sent to dialysis but complained of knee pain and refused to get up so dialysis wasn't done. She was sent to the ED. Confusion is baseline. Troponins 2172, 2755, 4099.Crt 10.6. EKG with new TWI antlat.  Currently she denies any chest pain and says she's never had. She thinks she's here for leg pain and a fall. Asking to go back to [facility] center.    Past Medical History:  Diagnosis Date   Anemia     chronic macrocytic anemia   Anxiety    Chronic kidney disease    Chronic renal disease, stage 4, severely decreased glomerular filtration rate (GFR) between 15-29 mL/min/1.73 square meter (HCC) 08/22/2015   Complication of anesthesia    delirious after Breast Surgery   Dementia (HCC)    mild   Depression    Diabetes mellitus with ESRD (end-stage renal disease) (HCC)    type II   Dysphagia    Dyspnea    with activity   GERD (gastroesophageal reflux disease)    Glaucoma    Hyperlipidemia    Hypertension    Multiple myeloma (HCC)    Pneumonia    Stage 1 infiltrating ductal carcinoma of right female breast (HCC) 08/21/2015   ER+ PR+ HER 2 neu + (3+) T1cN0     Past Surgical History:  Procedure Laterality Date   A/V FISTULAGRAM Right 07/05/2020   Procedure: A/V Fistulagram;   Surgeon: Sherren Kerns, MD;  [facility];  Service: Cardiovascular;  Laterality: Right;   AV FISTULA PLACEMENT Left 11/22/2017   Procedure: ARTERIOVENOUS (AV) FISTULA CREATION LEFT ARM;  Surgeon: Sherren Kerns, MD;  Location: [facility] OR;  Service: Vascular;  Laterality: Left;   AV FISTULA PLACEMENT Right 04/04/2020   Procedure: RIGHT ARM ARTERIOVENOUS FISTULA CREATION;  Surgeon: Larina Earthly, MD;  Location: [facility] ORS;  Service: Vascular;  Laterality: Right;   AV FISTULA PLACEMENT Right 08/20/2020   Procedure: ARTERIOVENOUS (AV) FISTULA LIGATION RIGHT ARM;  Surgeon: Sherren Kerns, MD;  Location: [facility] OR;  Service: Vascular;  Laterality: Right;   BIOPSY  08/07/2016   Procedure: BIOPSY;  Surgeon: Corbin Ade, MD;  Location: [facility];  Service: Endoscopy;;  gastric ulcer biopsy   COLONOSCOPY     ESOPHAGOGASTRODUODENOSCOPY N/A 08/07/2016   LA Grade A esophagitis s/p dilation, small hiatal hernia, multiple gastric ulcers and erosions, duodenal erosions s/p biopsy. Negative H.pylori    ESOPHAGOGASTRODUODENOSCOPY N/A 11/27/2016   normal esophagus, previously noted gastric ulcers completely healed, normal duodenum.    ESOPHAGOGASTRODUODENOSCOPY (EGD) WITH PROPOFOL N/A 07/23/2020   Procedure: ESOPHAGOGASTRODUODENOSCOPY (EGD) WITH PROPOFOL;  Surgeon: Malissa Hippo, MD;  [facility];  Service: Endoscopy;  Laterality: N/A;   FISTULA SUPERFICIALIZATION Left 02/14/2018   Procedure: FISTULA SUPERFICIALIZATION LEFT ARM;  Surgeon: Chuck Hint, MD;  Location: [facility] OR;  Service: Vascular;  Laterality: Left;   FRACTURE SURGERY Right    ankle   HOT HEMOSTASIS  07/23/2020   Procedure: HOT HEMOSTASIS (ARGON PLASMA COAGULATION/BICAP);  Surgeon: Malissa Hippo, MD;  [facility];  Service: Endoscopy;;   INTRAMEDULLARY (IM) NAIL INTERTROCHANTERIC Right 07/12/2020   Procedure: INTRAMEDULLARY (IM) NAIL INTERTROCHANTRIC;  Surgeon: Oliver Barre, MD;   Location: [facility];  Service: Orthopedics;  Laterality: Right;   IR FLUORO GUIDE CV LINE LEFT  12/21/2022   IR US GUIDE VASC ACCESS LEFT  12/21/2022   MALONEY DILATION N/A 08/07/2016   Procedure: Elease Hashimoto DILATION;  Surgeon: Corbin Ade, MD;  [facility];  Service: Endoscopy;  Laterality: N/A;   MASTECTOMY, PARTIAL Right    PERIPHERAL VASCULAR BALLOON ANGIOPLASTY Left 07/13/2019   Procedure: PERIPHERAL VASCULAR BALLOON ANGIOPLASTY;  Surgeon: Cephus Shelling, MD;  [facility];  Service: Cardiovascular;  Laterality: Left;  arm fistulogram   PERIPHERAL VASCULAR BALLOON ANGIOPLASTY Right 05/22/2020   Procedure: PERIPHERAL VASCULAR BALLOON ANGIOPLASTY;  Surgeon: Leonie Douglas, MD;  [facility];  Service: Cardiovascular;  Laterality: Right;  arm fistula   PERIPHERAL VASCULAR BALLOON ANGIOPLASTY Right 07/05/2020   Procedure: PERIPHERAL VASCULAR BALLOON ANGIOPLASTY;  Surgeon: Sherren Kerns, MD;  [facility];  Service: Cardiovascular;  Laterality: Right;  arm fistula   PORT-A-CATH REMOVAL Left 11/22/2017   Procedure: REMOVAL PORT-A-CATH LEFT CHEST;  Surgeon: Sherren Kerns, MD;  Location: [facility] OR;  Service: Vascular;  Laterality: Left;   RETINAL DETACHMENT SURGERY Right    SCLEROTHERAPY  07/23/2020   Procedure: SCLEROTHERAPY;  Surgeon: Malissa Hippo, MD;  [facility];  Service: Endoscopy;;   UPPER EXTREMITY VENOGRAPHY N/A 08/22/2020   Procedure: LEFT UPPER & CENTRAL VENOGRAPHY;  Surgeon: Cephus Shelling, MD;  [facility];  Service: Cardiovascular;  Laterality: N/A;     Home Medications:  Prior to Admission medications   Medication Sig Start Date End Date Taking? Authorizing Provider  acetaminophen (TYLENOL) 325 MG tablet Take 650 mg by mouth 3 (three) times daily.   Yes [provider]  acyclovir (ZOVIRAX) 200 MG capsule Take 200 mg by mouth in the morning. (0800) 10/20/19  Yes  [provider]  anastrozole (ARIMIDEX) 1 MG tablet TAKE 1 TABLET BY MOUTH DAILY 11/05/20  Yes [name], MD  Calcium Carb-Cholecalciferol (CALCIUM + VITAMIN D3) 500-5 MG-MCG TABS Take 1 tablet by mouth daily.   Yes [provider]  melatonin 5 MG TABS Take 5 mg by mouth at bedtime.   Yes [provider]  multivitamin (RENA-VIT) TABS tablet Take 1 tablet by mouth at bedtime.   Yes [provider]  Nutritional Supplements (ENSURE ENLIVE PO) Take 237 mLs by mouth every evening.   Yes [provider]  ondansetron (ZOFRAN-ODT) 4 MG disintegrating tablet Take 4 mg by mouth daily before breakfast. 01/01/22  Yes [provider]  pantoprazole (PROTONIX) 40 MG tablet Take 40 mg by mouth 2 (two) times daily.   Yes [provider]  sennosides-docusate sodium (SENOKOT-S) 8.6-50 MG tablet Take 1 tablet by mouth 2 (two) times daily.   Yes [provider]  sertraline (ZOLOFT) 25 MG tablet Take 25 mg by mouth daily. Was taking 50 mg but it stopped on 05-25-2023 11/01/22  Yes [provider]  sevelamer carbonate (RENVELA) 800 MG tablet Take 800 mg by mouth daily. 01/05/23  Yes [provider]  sucralfate (CARAFATE) 1 GM/10ML suspension Take 10 mLs by mouth 2 (two) times daily.   Yes [provider]  oseltamivir (TAMIFLU) 30 MG capsule Take 30 mg by mouth daily. 05/17/23   [provider]    Inpatient Medications: Scheduled Meds:  anastrozole  1 mg Oral Daily   aspirin EC  81 mg Oral Daily   atorvastatin  40 mg Oral Daily   calcium-vitamin D  1 tablet Oral Daily   heparin  3,000 Units Intravenous Once   pantoprazole  40 mg Oral BID   sevelamer carbonate  800 mg Oral Q supper   Continuous Infusions:  heparin 600 Units/hr (05/25/23 0542)   PRN Meds: acetaminophen **OR** acetaminophen, polyethylene glycol  Allergies:    Allergies  Allergen Reactions   Ace Inhibitors Other (See Comments) and  Cough    Tongue swelling Angioedema    Angiotensin Receptor Blockers Other (See Comments)    Angioedema with ACE-I   Penicillins Other (See Comments)    Unknown reaction    Social History:   Social History   Socioeconomic History   Marital status: Single    Spouse name: Not on file   Number of children: Not on file   Years of education: Not on file   Highest education level: Not on file  Occupational History   Occupation: retired   Tobacco Use   Smoking status: Never   Smokeless tobacco: Never  Vaping Use   Vaping status: Never Used  Substance and Sexual Activity   Alcohol use: No    Alcohol/week: 0.0 standard drinks of alcohol   Drug use: No   Sexual activity: Never  Other Topics Concern   Not on file  Social History Narrative   Long term resident of SNF    Social Drivers of Health   Financial Resource Strain: Low Risk  (01/09/2020)   Overall Financial Resource Strain (CARDIA)    Difficulty of Paying Living Expenses: Not very hard  Food Insecurity: Patient Unable To Answer (05/24/2023)   Hunger Vital Sign    Worried About Running Out of Food in the Last Year: Patient unable to answer    Ran Out of Food in the Last Year: Patient unable to answer  Transportation Needs: Patient Unable To Answer (05/24/2023)   PRAPARE - Transportation    Lack of Transportation (Medical): Patient unable to answer    Lack of Transportation (Non-Medical): Patient unable to answer  Physical Activity: Inactive (01/09/2020)   Exercise Vital Sign    Days of Exercise per Week: 0 days    Minutes of Exercise per Session: 0 min  Stress: No Stress Concern Present (01/09/2020)   [employer] of Occupational Health - Occupational Stress Questionnaire    Feeling of Stress : Not at all  Social Connections: Patient Unable To Answer (05/24/2023)   Social Connection and Isolation Panel [NHANES]    Frequency of Communication with Friends and Family: Patient unable to answer    Frequency of Social  Gatherings with Friends and Family: Patient unable to answer    Attends Religious Services: Patient unable to answer    Active Member of Clubs or Organizations: Patient unable to answer    Attends Banker Meetings: Patient unable to answer    Marital Status: Patient unable to answer  Intimate Partner Violence: Patient Unable To Answer (05/24/2023)  Humiliation, Afraid, Rape, and Kick questionnaire    Fear of Current or Ex-Partner: Patient unable to answer    Emotionally Abused: Patient unable to answer    Physically Abused: Patient unable to answer    Sexually Abused: Patient unable to answer    Family History:     Family History  Problem Relation Age of Onset   Multiple myeloma Sister    Brain cancer Sister    Dementia Mother        died at 8   Stroke Mother    Heart failure Mother    Diabetes Mother    Heart disease Father    Prostate cancer Brother    Colon cancer Neg Hx      ROS:  Please see the history of present illness.  Review of Systems  Reason unable to perform ROS: dementia.    All other ROS reviewed and negative.     Physical Exam/Data:   Vitals:   05/24/23 1230 05/24/23 1712 05/24/23 2025 05/25/23 0447  BP: (!) 150/77 (!) 153/72 (!) 163/85 (!) 178/94  Pulse: 85 80 79 77  Resp: (!) 21     Temp:  98.4 F (36.9 C) 97.6 F (36.4 C) (!) 97.5 F (36.4 C)  TempSrc:  Oral Oral Oral  SpO2: 96% 97% 97% 94%  Weight:      Height:        Intake/Output Summary (Last 24 hours) at 05/25/2023 0857 Last data filed at 05/25/2023 0557 Gross per 24 hour  Intake 45.98 ml  Output --  Net 45.98 ml      05/24/2023   12:22 PM 05/11/2023    2:26 PM 05/04/2023    1:34 PM  Last 3 Weights  Weight (lbs) 145 lb 7.7 oz 145 lb 8 oz 146 lb 6.4 oz  Weight (kg) 65.99 kg 65.998 kg 66.407 kg     Body mass index is 32.62 kg/m.  General:  Obese, in no acute distress  HEENT: normal Neck: no JVD Vascular: No carotid bruits; Distal pulses 2+ bilaterally Cardiac:   normal S1, S2; RRR;2/6 systolic murmur Lungs:  clear to auscultation bilaterally, no wheezing, rhonchi or rales  Abd: soft, nontender, no hepatomegaly  Ext: no edema, complains of pain in right LE with light touch. Musculoskeletal:  No deformities, BUE and BLE strength normal and equal Skin: warm and dry  Neuro:  CNs 2-12 intact, no focal abnormalities noted Psych:  Normal affect   EKG:  The EKG was personally reviewed and demonstrates:  NSR with TWI antlat which is new from 03/2023 Telemetry:  Telemetry was personally reviewed and demonstrates:  NSR with PVC's  Relevant CV Studies: Echo 12/2022 IMPRESSIONS     1. Intracavitary gradient of 98 mmHg (vel 4.9 m/s) due to hyperdynamic  function.. Left ventricular ejection fraction, by estimation, is >75%. The  left ventricle has hyperdynamic function. The left ventricle has no  regional wall motion abnormalities.  There is moderate asymmetric left ventricular hypertrophy of the septal  segment. Left ventricular diastolic parameters are indeterminate.   2. Right ventricular systolic function is normal. The right ventricular  size is normal. Tricuspid regurgitation signal is inadequate for assessing  PA pressure.   3. Chordal SAM. The mitral valve is degenerative. Trivial mitral valve  regurgitation. No evidence of mitral stenosis. The mean mitral valve  gradient is 4.0 mmHg with average heart rate of 105 bpm.   4. The aortic valve is abnormal. There is mild calcification of the  aortic  valve. Aortic valve regurgitation is not visualized. Mild to  moderate aortic valve stenosis. Aortic valve area, by VTI measures 1.37  cm. Aortic valve mean gradient measures 17.0   mmHg. Aortic valve Vmax measures 2.84 m/s.   5. The inferior vena cava is normal in size with greater than 50%  respiratory variability, suggesting right atrial pressure of 3 mmHg.    Laboratory Data:  High Sensitivity Troponin:   Recent Labs  Lab 05/24/23 1352  05/24/23 1953 05/25/23 0437  TROPONINIHS 2,172* 3,755* 4,099*     Chemistry Recent Labs  Lab 05/18/23 1313 05/24/23 1352 05/25/23 0437  NA 136 137 138  K 3.4* 4.3 4.2  CL 95* 95* 96*  CO2 25 23 23   GLUCOSE 195* 95 75  BUN 28* 42* 50*  CREATININE 6.77* 9.50* 10.62*  CALCIUM 9.3 9.6 9.6  MG 2.4 2.7*  --   GFRNONAA 6* 4* 3*  ANIONGAP 16* 19* 19*    Recent Labs  Lab 05/18/23 1313 05/24/23 1352  PROT 7.4 7.1  ALBUMIN 3.6 3.7  AST 17 28  ALT 11 14  ALKPHOS 82 80  BILITOT 0.4 0.5   Lipids No results for input(s): "CHOL", "TRIG", "HDL", "LABVLDL", "LDLCALC", "CHOLHDL" in the last 168 hours.  Hematology Recent Labs  Lab 05/18/23 1313 05/24/23 1352 05/25/23 0437  WBC 5.3 7.6 5.6  RBC 3.46* 3.38* 3.37*  HGB 11.2* 11.0* 11.0*  HCT 35.2* 34.3* 34.0*  MCV 101.7* 101.5* 100.9*  MCH 32.4 32.5 32.6  MCHC 31.8 32.1 32.4  RDW 14.8 14.9 15.1  PLT 223 209 190   Thyroid No results for input(s): "TSH", "FREET4" in the last 168 hours.  BNPNo results for input(s): "BNP", "PROBNP" in the last 168 hours.  DDimer No results for input(s): "DDIMER" in the last 168 hours.   Radiology/Studies:  DG Knee Complete 4 Views Right Result Date: 05/24/2023 CLINICAL DATA:  Right knee pain status post fall EXAM: RIGHT KNEE - COMPLETE 4 VIEW COMPARISON:  None Available. FINDINGS: Partially imaged right femoral fixation hardware appears intact. No evidence of fracture, dislocation, or joint effusion. Mild tricompartmental degenerative changes of the knee. Soft tissues are unremarkable. Vascular calcifications. IMPRESSION: No acute displaced fracture or dislocation. Electronically Signed   By: Agustin Cree M.D.   On: 05/24/2023 14:57   DG Hip Unilat W or Wo Pelvis 2-3 Views Right Result Date: 05/24/2023 CLINICAL DATA:  Right knee pain, status post fall. EXAM: DG HIP (WITH OR WITHOUT PELVIS) 2-3V RIGHT COMPARISON:  None Available. FINDINGS: Pelvis is intact with normal and symmetric sacroiliac joints. No  acute fracture or dislocation. There is coarse trabecular pattern of the right iliac bone, favored to represent underlying Paget's disease. Right femur ORIF metallic hardware noted. Visualized sacral arcuate lines are unremarkable. There are mild degenerative changes of bilateral hip joints characterized by mild-to-moderate joint space narrowing and osteophytosis of the superior acetabulum. No radiopaque foreign bodies. There is stable several well-circumscribed calcific densities overlying the right side of the pelvis, likely representing calcified leiomyomas. IMPRESSION: 1. No acute osseous abnormality of the pelvis or right hip joint. 2. Mild degenerative changes of bilateral hip joints. 3. Paget's disease of the right iliac bone. Electronically Signed   By: Jules Schick M.D.   On: 05/24/2023 14:54     Assessment and Plan:   NSTEMI  Trop elevated at :  2172, 3755, 4099 This is in the setting of ESRD/dialysis and hx hyperdynamic LV    The pt denies CP  She does have new  T wave inversion anterior(III)  and Inferiorly on EKG (specificifity limited in setting of known LVH on echo) She initially reported passing out   [facility] said this did not happen  Pt cannot remember now    The pt likely has CAD  but with age comorbidities  and lack of symptoms would  not pursue ischemic evaluation unless symptoms change  would    She likely has underlying CAD but with age and co morbidities and lack of symptoms would recomm medical Rx  . Reasonable to continue IV heparin for 48 hrs. On ASA & lipitor. Consider amlodipine or Imdur on non dialysis days.as BP allows    ESRD   Pt gets HD MWF     HTN-Echo 12/2022 intracavitary gradient 98 mmHg due to hyperdynamic function EF >75% with mod asymmetric LVH, mid to mod AS. BP up here but doesn't take any meds for this. ? Hypotensive with dialysis.   Mild to mod AS  Echo ordered   to reassess    DM2  Multiple myeloma  Dementia   For questions or updates,  please contact [facility] Please consult [email or web address] for contact info under    Signed, Jacolyn Reedy, PA-C  05/25/2023 8:57 AM  PT seen and examined   I have amended note above by Leda Gauze to reflect my findings  Pt comfortable   Denies CP  Can't now remember passing out     Does complain of R leg pain  On exam JVP is normal  Lungs are CTA  Cardiac   RRR  NO S3   II/VI systolic murmur base  Abd is supple  Ext  Trivial edema   Unable to tough R leg due to pain   See above for recommendations.   Echo planned for today   Dietrich Pates MD

## 2023-05-25 NOTE — Progress Notes (Signed)
*  PRELIMINARY RESULTS* Echocardiogram 2D Echocardiogram has been performed.  Stacey Drain 05/25/2023, 5:19 PM

## 2023-05-25 NOTE — NC FL2 (Signed)
 [place] MEDICAID [id number] LEVEL OF CARE FORM     IDENTIFICATION  Patient Name: Carly Wood Birthdate: 09-20-1935 Sex: female Admission Date (Current Location): 05/24/2023  [facility] and [place] Number:  [employer] and Address:  [facility],  [address], [place] [address]      Provider Number: [id number]  Attending Physician Name and Address:  Cleora Fleet, MD  Relative Name and Phone Number:       Current Level of Care: Hospital Recommended Level of Care: Skilled Nursing Facility Prior Approval Number:    Date Approved/Denied:   PASRR Number:    Discharge Plan: SNF    Current Diagnoses: Patient Active Problem List   Diagnosis Date Noted   Right knee pain 05/24/2023   Elevated troponin 05/24/2023   Aortic valve stenosis 05/24/2023   Prolonged Q-T interval on ECG 05/24/2023   Essential hypertension 05/04/2023   Complication associated with dialysis catheter 12/18/2022   Chronic generalized pain 08/21/2022   Numbness and tingling in both hands 07/16/2022   Post-menopausal osteoporosis 12/11/2021   Elevated liver enzymes 10/01/2021   Breast cancer (HCC) 10/01/2021   Calculus of gallbladder without cholecystitis without obstruction    History of hip fracture 07/03/2021   Edema 02/06/2021   Type 2 diabetes mellitus with hypertension and end stage renal disease on dialysis (HCC) 12/19/2020   Protein-calorie malnutrition, severe (HCC) 07/31/2020   Duodenal ulcer    Acute upper GI bleed 07/22/2020   Rectal bleeding 07/22/2020   Major depression, recurrent, chronic (HCC) 07/18/2020   ESRD on hemodialysis (HCC) 07/13/2020   Hyperlipidemia associated with type 2 diabetes mellitus (HCC) 06/19/2020   Aortic atherosclerosis (HCC) 04/01/2020   Atherosclerotic peripheral vascular disease (HCC) 04/01/2020   Hyperkalemia 12/25/2019   Dependence on renal dialysis (HCC) 10/25/2019   Chronic kidney disease with end stage renal disease on  dialysis due to type 2 diabetes mellitus (HCC) 10/19/2019   Hypertension due to end stage renal disease caused by type 2 diabetes mellitus, on dialysis (HCC) 10/19/2019   Anemia due to end stage renal disease (HCC) 10/19/2019   Herpes 10/19/2019   Vascular dementia with behavior disturbance (HCC) 10/19/2019   Diabetes mellitus with ESRD (end-stage renal disease) (HCC) 10/12/2019   Multiple myeloma (HCC) 10/04/2018   MGUS (monoclonal gammopathy of unknown significance) 09/20/2018   Iron deficiency anemia 05/03/2017   Multiple gastric ulcers 11/10/2016   Dysphagia 07/31/2016   GERD (gastroesophageal reflux disease) 07/31/2016   Lymphedema of arm 08/22/2015   Stage 1 infiltrating ductal carcinoma of right female breast (HCC) 08/21/2015    Orientation RESPIRATION BLADDER Height & Weight     Self, Time, Situation, Place  Normal Continent Weight: 145 lb 7.7 oz (66 kg) Height:  4\' 8"  (142.2 cm)  BEHAVIORAL SYMPTOMS/MOOD NEUROLOGICAL BOWEL NUTRITION STATUS      Continent Diet (See d/c summary)  AMBULATORY STATUS COMMUNICATION OF NEEDS Skin   Extensive Assist Verbally Normal                       Personal Care Assistance Level of Assistance  Bathing, Feeding, Dressing Bathing Assistance: Maximum assistance Feeding assistance: Limited assistance Dressing Assistance: Maximum assistance     Functional Limitations Info  Sight, Hearing, Speech Sight Info: Adequate Hearing Info: Adequate Speech Info: Adequate    SPECIAL CARE FACTORS FREQUENCY                       Contractures  Additional Factors Info  Code Status, Allergies Code Status Info: DNR- Limited Allergies Info: [place] Inhibitors, Angiotensin Receptor Blockers, Penicillins           Current Medications (05/25/2023):  This is the current hospital active medication list Current Facility-Administered Medications  Medication Dose Route Frequency Provider Last Rate Last Admin   acetaminophen (TYLENOL) tablet  650 mg  650 mg Oral Q6H PRN Emokpae, Ejiroghene E, MD       Or   acetaminophen (TYLENOL) suppository 650 mg  650 mg Rectal Q6H PRN Emokpae, Ejiroghene E, MD       anastrozole (ARIMIDEX) tablet 1 mg  1 mg Oral Daily Emokpae, Ejiroghene E, MD       aspirin EC tablet 81 mg  81 mg Oral Daily Emokpae, Ejiroghene E, MD   81 mg at 05/25/23 0908   atorvastatin (LIPITOR) tablet 40 mg  40 mg Oral Daily Emokpae, Ejiroghene E, MD   40 mg at 05/25/23 0908   calcium-vitamin D (OSCAL WITH D) 500-5 MG-MCG per tablet 1 tablet  1 tablet Oral Daily Emokpae, Ejiroghene E, MD   1 tablet at 05/25/23 0908   Chlorhexidine Gluconate Cloth 2 % PADS 6 each  6 each Topical Q0600 Johnson, Clanford L, MD       heparin ADULT infusion 100 units/mL (25000 units/269mL)  800 Units/hr Intravenous Continuous Earnie Larsson, RPH 8 mL/hr at 05/25/23 0908 800 Units/hr at 05/25/23 0908   pantoprazole (PROTONIX) EC tablet 40 mg  40 mg Oral BID Emokpae, Ejiroghene E, MD   40 mg at 05/25/23 0908   polyethylene glycol (MIRALAX / GLYCOLAX) packet 17 g  17 g Oral Daily PRN Emokpae, Ejiroghene E, MD       sevelamer carbonate (RENVELA) tablet 800 mg  800 mg Oral Q supper Emokpae, Ejiroghene E, MD   800 mg at 05/24/23 1934   Facility-Administered Medications Ordered in Other Encounters  Medication Dose Route Frequency Provider Last Rate Last Admin   lanreotide acetate (SOMATULINE DEPOT) 120 MG/0.5ML injection            lanreotide acetate (SOMATULINE DEPOT) 120 MG/0.5ML injection            lanreotide acetate (SOMATULINE DEPOT) 120 MG/0.5ML injection            lanreotide acetate (SOMATULINE DEPOT) 120 MG/0.5ML injection            octreotide (SANDOSTATIN LAR) 30 MG IM injection            octreotide (SANDOSTATIN LAR) 30 MG IM injection              Discharge Medications: Please see discharge summary for a list of discharge medications.  Relevant Imaging Results:  Relevant Lab Results:   Additional Information    Karn Cassis, LCSW

## 2023-05-25 NOTE — TOC Initial Note (Signed)
 Transition of Care [facility]) - Initial/Assessment Note    Patient Details  Name: Carly Wood MRN: [id number] Date of Birth: 11/08/1935  Transition of Care [facility]) CM/SW Contact:    Karn Cassis, LCSW Phone Number: 05/25/2023, 9:06 AM  Clinical Narrative: Pt admitted from [facility] where she has been a resident since August 2021. Pt's sister requests return to [facility] when discharged. She indicates pt's dialysis is at [facility] MWF. [facility] provides transportation. Per Lynnea Ferrier at [facility], okay to return. TOC will follow.                  Expected Discharge Plan: Skilled Nursing Facility Barriers to Discharge: Continued Medical Work up   Patient Goals and CMS Choice Patient states their goals for this hospitalization and ongoing recovery are:: return to SNF   Choice offered to / list presented to : Sibling [place] ownership interest in [facility]provided to:: Sibling    Expected Discharge Plan and Services In-house Referral: Clinical Social Work   Post Acute Care Choice: Resumption of Svcs/PTA Provider Living arrangements for the past 2 months: Skilled Nursing Facility                                      Prior Living Arrangements/Services Living arrangements for the past 2 months: Skilled Nursing Facility Lives with:: Facility Resident Patient language and need for interpreter reviewed:: Yes Do you feel safe going back to the place where you live?: Yes        Care giver support system in place?: Yes (comment)   Criminal Activity/Legal Involvement Pertinent to Current Situation/Hospitalization: No - Comment as needed  Activities of Daily Living   ADL Screening (condition at time of admission) Independently performs ADLs?: No Does the patient have a NEW difficulty with bathing/dressing/toileting/self-feeding that is expected to last >3 days?: No Does the patient have a NEW difficulty with getting in/out of bed, walking, or climbing stairs that is expected to  last >3 days?: No Does the patient have a NEW difficulty with communication that is expected to last >3 days?: No Is the patient deaf or have difficulty hearing?: No Does the patient have difficulty seeing, even when wearing glasses/contacts?: No Does the patient have difficulty concentrating, remembering, or making decisions?: Yes  Permission Sought/Granted   Permission granted to share information with : Yes, Verbal Permission Granted     Permission granted to share info w AGENCY: [facility]  Permission granted to share info w Relationship: SNF     Emotional Assessment         Alcohol / Substance Use: Not Applicable Psych Involvement: No (comment)  Admission diagnosis:  Syncope [R55] Elevated troponin [R79.89] Patient Active Problem List   Diagnosis Date Noted   Right knee pain 05/24/2023   Elevated troponin 05/24/2023   Aortic valve stenosis 05/24/2023   Prolonged Q-T interval on ECG 05/24/2023   Essential hypertension 05/04/2023   Complication associated with dialysis catheter 12/18/2022   Chronic generalized pain 08/21/2022   Numbness and tingling in both hands 07/16/2022   Post-menopausal osteoporosis 12/11/2021   Elevated liver enzymes 10/01/2021   Breast cancer (HCC) 10/01/2021   Calculus of gallbladder without cholecystitis without obstruction    History of hip fracture 07/03/2021   Edema 02/06/2021   Type 2 diabetes mellitus with hypertension and end stage renal disease on dialysis (HCC) 12/19/2020   Protein-calorie malnutrition, severe (HCC) 07/31/2020  Duodenal ulcer    Acute upper GI bleed 07/22/2020   Rectal bleeding 07/22/2020   Major depression, recurrent, chronic (HCC) 07/18/2020   ESRD on hemodialysis (HCC) 07/13/2020   Hyperlipidemia associated with type 2 diabetes mellitus (HCC) 06/19/2020   Aortic atherosclerosis (HCC) 04/01/2020   Atherosclerotic peripheral vascular disease (HCC) 04/01/2020   Hyperkalemia 12/25/2019   Dependence on renal dialysis  (HCC) 10/25/2019   Chronic kidney disease with end stage renal disease on dialysis due to type 2 diabetes mellitus (HCC) 10/19/2019   Hypertension due to end stage renal disease caused by type 2 diabetes mellitus, on dialysis (HCC) 10/19/2019   Anemia due to end stage renal disease (HCC) 10/19/2019   Herpes 10/19/2019   Vascular dementia with behavior disturbance (HCC) 10/19/2019   Diabetes mellitus with ESRD (end-stage renal disease) (HCC) 10/12/2019   Multiple myeloma (HCC) 10/04/2018   MGUS (monoclonal gammopathy of unknown significance) 09/20/2018   Iron deficiency anemia 05/03/2017   Multiple gastric ulcers 11/10/2016   Dysphagia 07/31/2016   GERD (gastroesophageal reflux disease) 07/31/2016   Lymphedema of arm 08/22/2015   Stage 1 infiltrating ductal carcinoma of right female breast (HCC) 08/21/2015   PCP:  Sharee Holster, NP Pharmacy:   [facility] - [place], [place] - [address] [place] [address] Phone: [phone] Fax: [phone]     Social Drivers of Health (SDOH) Social History: SDOH Screenings   Food Insecurity: Patient Unable To Answer (05/24/2023)  Housing: Patient Unable To Answer (05/24/2023)  Transportation Needs: Patient Unable To Answer (05/24/2023)  Utilities: Patient Unable To Answer (05/24/2023)  Alcohol Screen: Low Risk  (01/09/2020)  Depression (PHQ2-9): Low Risk  (03/17/2023)  Financial Resource Strain: Low Risk  (01/09/2020)  Physical Activity: Inactive (01/09/2020)  Social Connections: Patient Unable To Answer (05/24/2023)  Stress: No Stress Concern Present (01/09/2020)  Tobacco Use: Low Risk  (05/24/2023)   SDOH Interventions:     Readmission Risk Interventions    05/25/2023    9:03 AM 10/03/2021   12:36 PM  Readmission Risk Prevention Plan  Transportation Screening Complete Complete  PCP or Specialist Appt within 3-5 Days  Complete  HRI or Home Care Consult Complete Complete  Social Work  Consult for Recovery Care Planning/Counseling Complete Complete  Palliative Care Screening Not Applicable Complete  Medication Review Oceanographer) Complete Complete

## 2023-05-25 NOTE — Progress Notes (Signed)
 PHARMACY - ANTICOAGULATION CONSULT NOTE  Pharmacy Consult for heparin Indication: chest pain/ACS  Allergies  Allergen Reactions   Ace Inhibitors Other (See Comments) and Cough    Tongue swelling Angioedema    Angiotensin Receptor Blockers Other (See Comments)    Angioedema with ACE-I   Penicillins Other (See Comments)    Unknown reaction    Patient Measurements: Height: 4\' 8"  (142.2 cm) Weight: 66 kg (145 lb 7.7 oz) IBW/kg (Calculated) : 36.3 Heparin Dosing Weight: 52 kg  Vital Signs: Temp: 97.5 F (36.4 C) (03/18 0447) Temp Source: Oral (03/18 0447) BP: 178/94 (03/18 0447) Pulse Rate: 77 (03/18 0447)  Labs: Recent Labs    05/24/23 1352 05/24/23 1953 05/25/23 0437 05/25/23 0739  HGB 11.0*  --  11.0*  --   HCT 34.3*  --  34.0*  --   PLT 209  --  190  --   HEPARINUNFRC  --   --   --  <0.10*  CREATININE 9.50*  --  10.62*  --   TROPONINIHS 2,172* 3,755* 4,099*  --     Estimated Creatinine Clearance: 2.9 mL/min (A) (by C-G formula based on SCr of 10.62 mg/dL (H)).  Medications:  -No oral anticoagulation -Received 1 dose of subcutaneous heparin 5000 units ~2145  Assessment: 63 yoF presented to ED with right knee pain and syncope and found to to have elevated troponin at 2,172 without chest pain. Pharmacy consulted to dose heparin for ACS.  -Hgb 11, plts 190 -Trops 2172>>4099 -ESRD ([facility] MWF)  Initial heparin level undetectable on 600 units/hr. No bleeding or IV issues noted.   Goal of Therapy:  Heparin level 0.3-0.7 units/ml Monitor platelets by anticoagulation protocol: Yes   Plan:  Rebolus 3000 units x1 then increase rate to 800 units/hr Recheck heparin level this evening Follow up cardiology plan this am  Sheppard Coil PharmD., BCPS Clinical Pharmacist 05/25/2023 8:28 AM

## 2023-05-25 NOTE — Progress Notes (Signed)
 PHARMACY - ANTICOAGULATION CONSULT NOTE  Pharmacy Consult for heparin Indication: chest pain/ACS  Allergies  Allergen Reactions   Ace Inhibitors Other (See Comments) and Cough    Tongue swelling Angioedema    Angiotensin Receptor Blockers Other (See Comments)    Angioedema with ACE-I   Penicillins Other (See Comments)    Unknown reaction    Patient Measurements: Height: 4\' 8"  (142.2 cm) Weight: 66 kg (145 lb 7.7 oz) IBW/kg (Calculated) : 36.3 Heparin Dosing Weight: 52 kg  Vital Signs: Temp: 99 F (37.2 C) (03/18 2005) BP: 161/87 (03/18 2005) Pulse Rate: 88 (03/18 2005)  Labs: Recent Labs    05/24/23 1352 05/24/23 1953 05/25/23 0437 05/25/23 0739 05/25/23 1951  HGB 11.0*  --  11.0*  --   --   HCT 34.3*  --  34.0*  --   --   PLT 209  --  190  --   --   HEPARINUNFRC  --   --   --  <0.10* 0.17*  CREATININE 9.50*  --  10.62*  --   --   TROPONINIHS 2,172* 3,755* 4,099*  --   --     Estimated Creatinine Clearance: 2.9 mL/min (A) (by C-G formula based on SCr of 10.62 mg/dL (H)).  Medications:  -No oral anticoagulation -Received 1 dose of subcutaneous heparin 5000 units ~2145  Assessment: 85 yoF presented to ED with right knee pain and syncope and found to to have elevated troponin at 2,172 without chest pain. Pharmacy consulted to dose heparin for ACS.  -Hgb 11, plts 190 -Trops 2172>>4099 -ESRD ([facility] MWF)  Heparin level is subtherapeutic at 0.17 on 800 units/hour. No issues with infusion reported.  Goal of Therapy:  Heparin level 0.3-0.7 units/ml Monitor platelets by anticoagulation protocol: Yes   Plan:  Rebolus 1550 units x1 then increase rate to 1000 units/hr Recheck HL in AM Follow up cardiology plan this am  Thank you for involving pharmacy in this patient's care.   Rockwell Alexandria, PharmD Clinical Pharmacist 05/25/2023 9:16 PM

## 2023-05-26 DIAGNOSIS — C9000 Multiple myeloma not having achieved remission: Secondary | ICD-10-CM | POA: Diagnosis not present

## 2023-05-26 DIAGNOSIS — Z992 Dependence on renal dialysis: Secondary | ICD-10-CM | POA: Diagnosis not present

## 2023-05-26 DIAGNOSIS — R7989 Other specified abnormal findings of blood chemistry: Secondary | ICD-10-CM | POA: Diagnosis not present

## 2023-05-26 DIAGNOSIS — I350 Nonrheumatic aortic (valve) stenosis: Secondary | ICD-10-CM | POA: Diagnosis not present

## 2023-05-26 LAB — CBC
HCT: 33.4 % — ABNORMAL LOW (ref 36.0–46.0)
Hemoglobin: 10.7 g/dL — ABNORMAL LOW (ref 12.0–15.0)
MCH: 32.2 pg (ref 26.0–34.0)
MCHC: 32 g/dL (ref 30.0–36.0)
MCV: 100.6 fL — ABNORMAL HIGH (ref 80.0–100.0)
Platelets: 195 10*3/uL (ref 150–400)
RBC: 3.32 MIL/uL — ABNORMAL LOW (ref 3.87–5.11)
RDW: 15.1 % (ref 11.5–15.5)
WBC: 5.5 10*3/uL (ref 4.0–10.5)
nRBC: 0 % (ref 0.0–0.2)

## 2023-05-26 LAB — RENAL FUNCTION PANEL
Albumin: 3.4 g/dL — ABNORMAL LOW (ref 3.5–5.0)
Anion gap: 17 — ABNORMAL HIGH (ref 5–15)
BUN: 61 mg/dL — ABNORMAL HIGH (ref 8–23)
CO2: 23 mmol/L (ref 22–32)
Calcium: 9.2 mg/dL (ref 8.9–10.3)
Chloride: 97 mmol/L — ABNORMAL LOW (ref 98–111)
Creatinine, Ser: 11.95 mg/dL — ABNORMAL HIGH (ref 0.44–1.00)
GFR, Estimated: 3 mL/min — ABNORMAL LOW (ref >60)
Glucose, Bld: 104 mg/dL — ABNORMAL HIGH (ref 70–99)
Phosphorus: 5.3 mg/dL — ABNORMAL HIGH (ref 2.5–4.6)
Potassium: 4.4 mmol/L (ref 3.5–5.1)
Sodium: 137 mmol/L (ref 135–145)

## 2023-05-26 LAB — HEPATITIS B SURFACE ANTIGEN: Hepatitis B Surface Ag: NONREACTIVE

## 2023-05-26 LAB — HEPARIN LEVEL (UNFRACTIONATED)
Heparin Unfractionated: 0.18 [IU]/mL — ABNORMAL LOW (ref 0.30–0.70)
Heparin Unfractionated: 0.69 [IU]/mL (ref 0.30–0.70)

## 2023-05-26 MED ORDER — CHLORHEXIDINE GLUCONATE CLOTH 2 % EX PADS
6.0000 | MEDICATED_PAD | Freq: Every day | CUTANEOUS | Status: DC
  Administered 2023-05-26 – 2023-05-27 (×2): 6 via TOPICAL

## 2023-05-26 MED ORDER — HEPARIN SODIUM (PORCINE) 1000 UNIT/ML IJ SOLN
INTRAMUSCULAR | Status: AC
  Filled 2023-05-26: qty 4

## 2023-05-26 MED ORDER — HEPARIN BOLUS VIA INFUSION
2000.0000 [IU] | Freq: Once | INTRAVENOUS | Status: AC
  Administered 2023-05-26: 2000 [IU] via INTRAVENOUS
  Filled 2023-05-26: qty 2000

## 2023-05-26 NOTE — Progress Notes (Signed)
   Progress Note  Patient Name: Carly Wood Date of Encounter: 05/26/2023  Consulting Cardiologist: Dietrich Pates. MD  Chart reviewed including cardiology consultation by Dr. Tenny Craw yesterday.  Echocardiogram demonstrates LVEF 55 to 60% with moderate asymmetric septal hypertrophy and inferoseptal wall motion abnormality.  Mild to moderate aortic stenosis with mean AV gradient 12 mmHg.  Dementia and patient's recollection of symptoms and recent events limits assessment.  She is hemodynamically stable, hypertensive.  Peak high-sensitivity troponin I 4099.  At this point would plan medical management of NSTEMI, not optimal candidate for aggressive, invasive cardiac workup.  Continue aspirin 81 mg daily, Lipitor 40 mg daily, and a 48-hour course of IV heparin.  Signed, Nona Dell, MD  05/26/2023, 11:31 AM

## 2023-05-26 NOTE — Progress Notes (Signed)
 PROGRESS NOTE  Carly Wood ZOX:[id number] DOB: 1935/04/12 DOA: 05/24/2023 PCP: Sharee Holster, NP  Brief History:  88 year old female resident of [facility] with past medical history of ERSD on HD, multiple myeloma, breast cancer, diabetes mellitus, dementia started complaining of knee pain and refused to get up for dialysis and subsequently sent to the ED.  She had no other complaints of chest pain or shortness of breath.  No evidence that she passed out.  She was noted to have an elevated troponin and admitted with concern for NSTEMI. Troponins peaked at 4099.  Cardiology was consulted assist with management.  They did not feel the patient was a candidate for advanced therapy.  The patient was started on IV heparin for 48 hours. Regarding the patient's knee pain, it was difficult to clarify the chronicity.  Nevertheless, clinical suspicion for septic arthritis was extremely low.   Assessment/Plan: NSTEMI -troponin peaked 4099 -appreciate cardiology consult>>not a candidate for advanced therapy -finish 48 hours IV heparin -continue ASA, lipitor -3/18 Echo--EF 55-60%, HK distal inferoseptal, mild AS, normal RVF   Right knee pain and Left knee pain? -Right knee x-ray-no acute displaced fracture or dislocation. -doubt septic arthritis -check  Nursing home staff denies falls or syncope.  Patient has vascular dementia with behavioral disturbance, per nursing home staff baseline she has episodes of confusion.  uric acid  Breast cancer Resume anastrozole   Type 2 diabetes mellitus with renal complications -diet controlled -04/01/23 A1C--4.9   ESRD on HD  Dialysis schedule Monday Wednesday Friday - nephrology consult requested for inpatient HD - resume renal meds - received HD 3/19   Hypertension due to end stage renal disease caused by type 2 diabetes mellitus, on dialysis  - following BP closely    Multiple myeloma  Follows with Dr. Ellin Saba.  On Velcade every  other week. -last infusion 05/18/23  Dyslipidemia -continue statin  Major Neurocognitive Disorder -review of medical record shows she occasionally has invalidated responses          Family Communication:  no Family at bedside  Consultants:  renal, cardiology  Code Status: DNR  DVT Prophylaxis:  IV Heparin   Procedures: As Listed in Progress Note Above  Antibiotics: None      Subjective: Pt complains of knee pain in the left and right.  Denies cp, sob, n/v/d, abd pain  Objective: Vitals:   05/26/23 1550 05/26/23 1600 05/26/23 1617 05/26/23 1656  BP: (!) 174/92 (!) (P) 185/87 (!) 140/100 (!) 153/91  Pulse: (!) 103 (P) 98 (!) 103   Resp: (!) 25 (!) (P) 22 (!) 22   Temp:   98.6 F (37 C)   TempSrc:   Oral   SpO2: 100% (P) 99% 99%   Weight:      Height:        Intake/Output Summary (Last 24 hours) at 05/26/2023 1726 Last data filed at 05/26/2023 1617 Gross per 24 hour  Intake --  Output 2000 ml  Net -2000 ml   Weight change:  Exam:  General:  Pt is alert, follows commands appropriately, not in acute distress HEENT: No icterus, No thrush, No neck mass, [place]/AT Cardiovascular: RRR, S1/S2, no rubs, no gallops Respiratory: bibasilar rales.  No wheeze Abdomen: Soft/+BS, non tender, non distended, no guarding Extremities: No edema, No lymphangitis, No petechiae, No rashes, no synovitis or redness of right knee   Data Reviewed: I have personally reviewed following labs and imaging studies  Basic Metabolic Panel: Recent Labs  Lab 05/24/23 1352 05/25/23 0437 05/26/23 0524  NA 137 138 137  K 4.3 4.2 4.4  CL 95* 96* 97*  CO2 23 23 23   GLUCOSE 95 75 104*  BUN 42* 50* 61*  CREATININE 9.50* 10.62* 11.95*  CALCIUM 9.6 9.6 9.2  MG 2.7*  --   --   PHOS  --   --  5.3*   Liver Function Tests: Recent Labs  Lab 05/24/23 1352 05/26/23 0524  AST 28  --   ALT 14  --   ALKPHOS 80  --   BILITOT 0.5  --   PROT 7.1  --   ALBUMIN 3.7 3.4*   No results for  input(s): "LIPASE", "AMYLASE" in the last 168 hours. No results for input(s): "AMMONIA" in the last 168 hours. Coagulation Profile: No results for input(s): "INR", "PROTIME" in the last 168 hours. CBC: Recent Labs  Lab 05/24/23 1352 05/25/23 0437 05/26/23 0524  WBC 7.6 5.6 5.5  NEUTROABS 6.0  --   --   HGB 11.0* 11.0* 10.7*  HCT 34.3* 34.0* 33.4*  MCV 101.5* 100.9* 100.6*  PLT 209 190 195   Cardiac Enzymes: No results for input(s): "CKTOTAL", "CKMB", "CKMBINDEX", "TROPONINI" in the last 168 hours. BNP: Invalid input(s): "POCBNP" CBG: No results for input(s): "GLUCAP" in the last 168 hours. HbA1C: No results for input(s): "HGBA1C" in the last 72 hours. Urine analysis: No results found for: "COLORURINE", "APPEARANCEUR", "LABSPEC", "PHURINE", "GLUCOSEU", "HGBUR", "BILIRUBINUR", "KETONESUR", "PROTEINUR", "UROBILINOGEN", "NITRITE", "LEUKOCYTESUR" Sepsis Labs: @LABRCNTIP (procalcitonin:4,lacticidven:4) ) Recent Results (from the past 240 hours)  Resp panel by RT-PCR (RSV, Flu A&B, Covid) Anterior Nasal Swab     Status: None   Collection Time: 05/17/23  5:26 PM   Specimen: Anterior Nasal Swab  Result Value Ref Range Status   SARS Coronavirus 2 by RT PCR NEGATIVE NEGATIVE Final    Comment: (NOTE) SARS-CoV-2 target nucleic acids are NOT DETECTED.  The SARS-CoV-2 RNA is generally detectable in upper respiratory specimens during the acute phase of infection. The lowest concentration of SARS-CoV-2 viral copies this assay can detect is 138 copies/mL. A negative result does not preclude SARS-Cov-2 infection and should not be used as the sole basis for treatment or other patient management decisions. A negative result may occur with  improper specimen collection/handling, submission of specimen other than nasopharyngeal swab, presence of viral mutation(s) within the areas targeted by this assay, and inadequate number of viral copies(<138 copies/mL). A negative result must be combined  with clinical observations, patient history, and epidemiological information. The expected result is Negative.  Fact Sheet for Patients:  BloggerCourse.com  Fact Sheet for Healthcare Providers:  SeriousBroker.it  This test is no t yet approved or cleared by the [place] FDA and  has been authorized for detection and/or diagnosis of SARS-CoV-2 by FDA under an Emergency Use Authorization (EUA). This EUA will remain  in effect (meaning this test can be used) for the duration of the COVID-19 declaration under Section 564(b)(1) of the Act, 21 U.S.C.section 360bbb-3(b)(1), unless the authorization is terminated  or revoked sooner.       Influenza A by PCR NEGATIVE NEGATIVE Final   Influenza B by PCR NEGATIVE NEGATIVE Final    Comment: (NOTE) The Xpert Xpress SARS-CoV-2/FLU/RSV plus assay is intended as an aid in the diagnosis of influenza from Nasopharyngeal swab specimens and should not be used as a sole basis for treatment. Nasal washings and aspirates are unacceptable for Xpert Xpress SARS-CoV-2/FLU/RSV testing.  Fact Sheet for Patients: BloggerCourse.com  Fact Sheet for Healthcare Providers: SeriousBroker.it  This test is not yet approved or cleared by the [place] FDA and has been authorized for detection and/or diagnosis of SARS-CoV-2 by FDA under an Emergency Use Authorization (EUA). This EUA will remain in effect (meaning this test can be used) for the duration of the COVID-19 declaration under Section 564(b)(1) of the Act, 21 U.S.C. section 360bbb-3(b)(1), unless the authorization is terminated or revoked.     Resp Syncytial Virus by PCR NEGATIVE NEGATIVE Final    Comment: (NOTE) Fact Sheet for Patients: BloggerCourse.com  Fact Sheet for Healthcare Providers: SeriousBroker.it  This test is not yet approved  or cleared by the [place] FDA and has been authorized for detection and/or diagnosis of SARS-CoV-2 by FDA under an Emergency Use Authorization (EUA). This EUA will remain in effect (meaning this test can be used) for the duration of the COVID-19 declaration under Section 564(b)(1) of the Act, 21 U.S.C. section 360bbb-3(b)(1), unless the authorization is terminated or revoked.  Performed at [facility], [address], [place], [place] [address]   MRSA Next Gen by PCR, Nasal     Status: None   Collection Time: 05/24/23  6:45 PM   Specimen: Nasal Mucosa; Nasal Swab  Result Value Ref Range Status   MRSA by PCR Next Gen NOT DETECTED NOT DETECTED Final    Comment: (NOTE) The GeneXpert MRSA Assay (FDA approved for NASAL specimens only), is one component of a comprehensive MRSA colonization surveillance program. It is not intended to diagnose MRSA infection nor to guide or monitor treatment for MRSA infections. Test performance is not FDA approved in patients less than 39 years old. Performed at [facility], [address], [place], [place] [address]      Scheduled Meds:  anastrozole  1 mg Oral Daily   aspirin EC  81 mg Oral Daily   atorvastatin  40 mg Oral Daily   calcium-vitamin D  1 tablet Oral Daily   Chlorhexidine Gluconate Cloth  6 each Topical Q0600   Chlorhexidine Gluconate Cloth  6 each Topical Q0600   pantoprazole  40 mg Oral BID   sevelamer carbonate  800 mg Oral Q supper   Continuous Infusions:  heparin 950 Units/hr (05/26/23 1656)    Procedures/Studies: ECHOCARDIOGRAM COMPLETE Result Date: 05/25/2023    ECHOCARDIOGRAM REPORT   Patient Name:   Carly Wood Date of Exam: 05/25/2023 Medical Rec #:  [id number]      Height:       56.0 in Accession #:    [id number]     Weight:       145.5 lb Date of Birth:  11/09/1935     BSA:          1.551 m Patient Age:    87 years       BP: Patient Gender: F              HR:           87 bpm. [facility] Procedure:  2D Echo (Both Spectral and Color Flow Doppler were utilized during            procedure). Indications:    Elevated troponin                 Murmur; R01.1 Murmur  History:        Patient has prior history of Echocardiogram examinations.  Sonographer:    BW Referring Phys: [id number]  EJIROGHENE E EMOKPAE IMPRESSIONS  1. Hypokinesis of the distal inferoseptum, distal inferior and apical walls     COmpared to echo from 2024, wall motion changes are new and LVEF is not as vigorous.. Left ventricular ejection fraction, by estimation, is 55 to 60%. The left ventricle has normal function. There is moderate asymmetric left ventricular hypertrophy. Indeterminate diastolic filling due to E-A fusion.  2. Right ventricular systolic function is normal. The right ventricular size is normal. Mildly increased right ventricular wall thickness.  3. Left atrial size was moderately dilated.  4. Trivial mitral valve regurgitation.  5. AV is thickened, calcified with restricted motion Peak and mean gradients through the valve are 21 and 12 mm Hg respectively AVA (VTI) is 1.1 cm2 Dimensionless index is 0.5 consistent with mild AS. 2D imaging suggests that AS is more moderate . The aortic valve is abnormal. Aortic valve regurgitation is not visualized.  6. The inferior vena cava is normal in size with greater than 50% respiratory variability, suggesting right atrial pressure of 3 mmHg. FINDINGS  Left Ventricle: Hypokinesis of the distal inferoseptum, distal inferior and apical walls COmpared to echo from 2024, wall motion changes are new and LVEF is not as vigorous. Left ventricular ejection fraction, by estimation, is 55 to 60%. The left ventricle has normal function. The left ventricular internal cavity size was normal in size. There is moderate asymmetric left ventricular hypertrophy. Indeterminate diastolic filling due to E-A fusion. Right Ventricle: The right ventricular size is normal. Mildly increased right ventricular wall thickness.  Right ventricular systolic function is normal. Left Atrium: Left atrial size was moderately dilated. Right Atrium: Right atrial size was normal in size. Pericardium: There is no evidence of pericardial effusion. Mitral Valve: There is moderate thickening of the mitral valve leaflet(s). There is mild calcification of the mitral valve leaflet(s). Mild to moderate mitral annular calcification. Trivial mitral valve regurgitation. MV peak gradient, 8.2 mmHg. The mean  mitral valve gradient is 2.0 mmHg. Tricuspid Valve: The tricuspid valve is normal in structure. Tricuspid valve regurgitation is trivial. Aortic Valve: AV is thickened, calcified with restricted motion Peak and mean gradients through the valve are 21 and 12 mm Hg respectively AVA (VTI) is 1.1 cm2 Dimensionless index is 0.5 consistent with mild AS. 2D imaging suggests that AS is more moderate. The aortic valve is abnormal. Aortic valve regurgitation is not visualized. Aortic valve mean gradient measures 11.5 mmHg. Aortic valve peak gradient measures 21.3 mmHg. Aortic valve area, by VTI measures 1.14 cm. Pulmonic Valve: The pulmonic valve was not well visualized. Pulmonic valve regurgitation is not visualized. No evidence of pulmonic stenosis. Aorta: The aortic root is normal in size and structure. Venous: The inferior vena cava is normal in size with greater than 50% respiratory variability, suggesting right atrial pressure of 3 mmHg. IAS/Shunts: No atrial level shunt detected by color flow Doppler.  LEFT VENTRICLE PLAX 2D LVIDd:         3.80 cm LVIDs:         2.50 cm LV PW:         1.10 cm LV IVS:        1.40 cm LVOT diam:     1.70 cm LV SV:         54 LV SV Index:   35 LVOT Area:     2.27 cm  RIGHT VENTRICLE RV S prime:     16.50 cm/s TAPSE (M-mode): 2.4 cm LEFT ATRIUM  Index        RIGHT ATRIUM           Index LA diam:        3.20 cm 2.06 cm/m   RA Area:     11.20 cm LA Vol (A2C):   48.2 ml 31.08 ml/m  RA Volume:   23.50 ml  15.16 ml/m LA  Vol (A4C):   94.7 ml 61.07 ml/m LA Biplane Vol: 71.4 ml 46.05 ml/m  AORTIC VALVE AV Area (Vmax):    1.16 cm AV Area (Vmean):   1.10 cm AV Area (VTI):     1.14 cm AV Vmax:           231.00 cm/s AV Vmean:          154.500 cm/s AV VTI:            0.468 m AV Peak Grad:      21.3 mmHg AV Mean Grad:      11.5 mmHg LVOT Vmax:         118.00 cm/s LVOT Vmean:        74.800 cm/s LVOT VTI:          0.236 m LVOT/AV VTI ratio: 0.50  AORTA Ao Root diam: 3.30 cm MITRAL VALVE MV Area (PHT): 4.80 cm     SHUNTS MV Area VTI:   1.93 cm     Systemic VTI:  0.24 m MV Peak grad:  8.2 mmHg     Systemic Diam: 1.70 cm MV Mean grad:  2.0 mmHg MV Vmax:       1.43 m/s MV Vmean:      61.1 cm/s MV Decel Time: 158 msec MV E velocity: 152.00 cm/s Dietrich Pates MD Electronically signed by Dietrich Pates MD Signature Date/Time: 05/25/2023/5:21:07 PM    Final    DG Knee Complete 4 Views Right Result Date: 05/24/2023 CLINICAL DATA:  Right knee pain status post fall EXAM: RIGHT KNEE - COMPLETE 4 VIEW COMPARISON:  None Available. FINDINGS: Partially imaged right femoral fixation hardware appears intact. No evidence of fracture, dislocation, or joint effusion. Mild tricompartmental degenerative changes of the knee. Soft tissues are unremarkable. Vascular calcifications. IMPRESSION: No acute displaced fracture or dislocation. Electronically Signed   By: Agustin Cree M.D.   On: 05/24/2023 14:57   DG Hip Unilat W or Wo Pelvis 2-3 Views Right Result Date: 05/24/2023 CLINICAL DATA:  Right knee pain, status post fall. EXAM: DG HIP (WITH OR WITHOUT PELVIS) 2-3V RIGHT COMPARISON:  None Available. FINDINGS: Pelvis is intact with normal and symmetric sacroiliac joints. No acute fracture or dislocation. There is coarse trabecular pattern of the right iliac bone, favored to represent underlying Paget's disease. Right femur ORIF metallic hardware noted. Visualized sacral arcuate lines are unremarkable. There are mild degenerative changes of bilateral hip joints  characterized by mild-to-moderate joint space narrowing and osteophytosis of the superior acetabulum. No radiopaque foreign bodies. There is stable several well-circumscribed calcific densities overlying the right side of the pelvis, likely representing calcified leiomyomas. IMPRESSION: 1. No acute osseous abnormality of the pelvis or right hip joint. 2. Mild degenerative changes of bilateral hip joints. 3. Paget's disease of the right iliac bone. Electronically Signed   By: Jules Schick M.D.   On: 05/24/2023 14:54    Catarina Hartshorn, DO  [facility]  If 7PM-7AM, please contact night-coverage [email or web address] Password [email or web address] 05/26/2023, 5:26 PM   LOS: 2 days

## 2023-05-26 NOTE — Procedures (Signed)
 HD Note:  Some information was entered later than the data was gathered due to patient care needs. The stated time with the data is accurate.  Received patient in bed to unit.   Alert and oriented to self, place, and situation.  Informed consent signed and in chart.   Access used: Left upper chest HD catheter Access issues: Initial arterial pressures too high.  BFR reduced to 300.  Lines were reversed and BFR was able to be restored to 400.  Patient tolerated treatment well.   TX duration: 3.5 hours  Alert, without acute distress.  Total UF removed: 2000 ml  Hand-off given to patient's nurse.   Transported back to the room   Hans Rusher L. Dareen Piano, RN [facility] D[facility] Unit.

## 2023-05-26 NOTE — Progress Notes (Signed)
 PHARMACY - ANTICOAGULATION CONSULT NOTE  Pharmacy Consult for heparin Indication:  NSTEMI  Labs: Recent Labs    05/24/23 1352 05/24/23 1953 05/25/23 0437 05/25/23 0739 05/25/23 1951 05/26/23 0524  HGB 11.0*  --  11.0*  --   --  10.7*  HCT 34.3*  --  34.0*  --   --  33.4*  PLT 209  --  190  --   --  195  HEPARINUNFRC  --   --   --  <0.10* 0.17* 0.18*  CREATININE 9.50*  --  10.62*  --   --  11.95*  TROPONINIHS 2,172* 3,755* 4,099*  --   --   --    Assessment: 87yo female subtherapeutic on heparin with no change in level after bolus and rate increase though RN notes that IV is in [facility] and pt is very edematous and line occludes very easily with movement; no signs of bleeding per RN.  Pt going for HD today.  Goal of Therapy:  Heparin level 0.3-0.7 units/ml   Plan:  2000 units heparin bolus. Maintain heparin infusion at 1000 units/hr. Check level in 8 hours.   Vernard Gambles, PharmD, BCPS 05/26/2023 6:33 AM

## 2023-05-26 NOTE — Progress Notes (Signed)
 PHARMACY - ANTICOAGULATION CONSULT NOTE  Pharmacy Consult for heparin Indication: chest pain/ACS  Allergies  Allergen Reactions   Ace Inhibitors Other (See Comments) and Cough    Tongue swelling Angioedema    Angiotensin Receptor Blockers Other (See Comments)    Angioedema with ACE-I   Penicillins Other (See Comments)    Unknown reaction    Patient Measurements: Height: 4\' 8"  (142.2 cm) Weight: 66 kg (145 lb 7.7 oz) IBW/kg (Calculated) : 36.3 Heparin Dosing Weight: 52 kg  Vital Signs: Temp: 98.5 F (36.9 C) (03/19 1151) Temp Source: Oral (03/19 0445) BP: 174/92 (03/19 1550) Pulse Rate: 103 (03/19 1550)  Labs: Recent Labs    05/24/23 1352 05/24/23 1953 05/25/23 0437 05/25/23 0739 05/25/23 1951 05/26/23 0524 05/26/23 1430  HGB 11.0*  --  11.0*  --   --  10.7*  --   HCT 34.3*  --  34.0*  --   --  33.4*  --   PLT 209  --  190  --   --  195  --   HEPARINUNFRC  --   --   --    < > 0.17* 0.18* 0.69  CREATININE 9.50*  --  10.62*  --   --  11.95*  --   TROPONINIHS 2,172* 3,755* 4,099*  --   --   --   --    < > = values in this interval not displayed.    Estimated Creatinine Clearance: 2.6 mL/min (A) (by C-G formula based on SCr of 11.95 mg/dL (H)).   Assessment: 42 yoF presented to ED with right knee pain and syncope and found to to have elevated troponin at 2,172 without chest pain. Pharmacy consulted to dose heparin for ACS.  -Hgb 11, plts 190 -Trops 2172>>4099 -ESRD (HD MWF)  Heparin level is therapeutic at 0.69 on 1000 units/hour. No issues with infusion reported.  Goal of Therapy:  Heparin level 0.3-0.7 units/ml Monitor platelets by anticoagulation protocol: Yes   Plan:  Decrease heparin infusion to 950 units/hr Recheck HL in AM Follow up cardiology plan this am  Thank you for involving pharmacy in this patient's care.   Judeth Cornfield, PharmD Clinical Pharmacist 05/26/2023 4:05 PM

## 2023-05-26 NOTE — Consult Note (Signed)
 [facility] Renal Consultation Note    Indication for Consultation:  Management of ESRD/hemodialysis; anemia, hypertension/volume and secondary hyperparathyroidism  HPI: Carly Wood is a 88 y.o. female with a PMH significant for DM type 2, HTN, HLD, multiple myeloma, h/o breast cancer, dementia and ESRD who was brought to [facility] ED via EMS from her dialysis center on 05/24/23 after complaining of right knee pain and not being able to stand and pivot to get into her recliner for HD.  The patient states that she passed out at the SNF, however they deny any recent falls.  She also complained of chest pain.  ECG had some twave changes and was noted to have elevated troponins.  She was admitted and we were consulted to provide dialysis during her hospitalization.  She is currently chest pain free.   Past Medical History:  Diagnosis Date   Anemia     chronic macrocytic anemia   Anxiety    Chronic kidney disease    Chronic renal disease, stage 4, severely decreased glomerular filtration rate (GFR) between 15-29 mL/min/1.73 square meter (HCC) 08/22/2015   Complication of anesthesia    delirious after Breast Surgery   Dementia (HCC)    mild   Depression    Diabetes mellitus with ESRD (end-stage renal disease) (HCC)    type II   Dysphagia    Dyspnea    with activity   GERD (gastroesophageal reflux disease)    Glaucoma    Hyperlipidemia    Hypertension    Multiple myeloma (HCC)    Pneumonia    Stage 1 infiltrating ductal carcinoma of right female breast (HCC) 08/21/2015   ER+ PR+ HER 2 neu + (3+) T1cN0    Past Surgical History:  Procedure Laterality Date   A/V FISTULAGRAM Right 07/05/2020   Procedure: A/V Fistulagram;  Surgeon: Sherren Kerns, MD;  [facility];  Service: Cardiovascular;  Laterality: Right;   AV FISTULA PLACEMENT Left 11/22/2017   Procedure: ARTERIOVENOUS (AV) FISTULA CREATION LEFT ARM;  Surgeon: Sherren Kerns, MD;  Location: [facility] OR;   Service: Vascular;  Laterality: Left;   AV FISTULA PLACEMENT Right 04/04/2020   Procedure: RIGHT ARM ARTERIOVENOUS FISTULA CREATION;  Surgeon: Larina Earthly, MD;  Location: [facility]S;  Service: Vascular;  Laterality: Right;   AV FISTULA PLACEMENT Right 08/20/2020   Procedure: ARTERIOVENOUS (AV) FISTULA LIGATION RIGHT ARM;  Surgeon: Sherren Kerns, MD;  Location: [facility] OR;  Service: Vascular;  Laterality: Right;   BIOPSY  08/07/2016   Procedure: BIOPSY;  Surgeon: Corbin Ade, MD;  Location: [facility];  Service: Endoscopy;;  gastric ulcer biopsy   COLONOSCOPY     ESOPHAGOGASTRODUODENOSCOPY N/A 08/07/2016   LA Grade A esophagitis s/p dilation, small hiatal hernia, multiple gastric ulcers and erosions, duodenal erosions s/p biopsy. Negative H.pylori    ESOPHAGOGASTRODUODENOSCOPY N/A 11/27/2016   normal esophagus, previously noted gastric ulcers completely healed, normal duodenum.    ESOPHAGOGASTRODUODENOSCOPY (EGD) WITH PROPOFOL N/A 07/23/2020   Procedure: ESOPHAGOGASTRODUODENOSCOPY (EGD) WITH PROPOFOL;  Surgeon: Malissa Hippo, MD;  Location[facility];  Service: Endoscopy;  Laterality: N/A;   FISTULA SUPERFICIALIZATION Left 02/14/2018   Procedure: FISTULA SUPERFICIALIZATION LEFT ARM;  Surgeon: Chuck Hint, MD;  Location: [facility] OR;  Service: Vascular;  Laterality: Left;   FRACTURE SURGERY Right    ankle   HOT HEMOSTASIS  07/23/2020   Procedure: HOT HEMOSTASIS (ARGON PLASMA COAGULATION/BICAP);  Surgeon: Malissa Hippo, MD;  [facility];  Service: Endoscopy;;   INTRAMEDULLARY (IM) NAIL INTERTROCHANTERIC Right 07/12/2020   Procedure: INTRAMEDULLARY (IM) NAIL INTERTROCHANTRIC;  Surgeon: Oliver Barre, MD;  Location: [facility];  Service: Orthopedics;  Laterality: Right;   IR FLUORO GUIDE CV LINE LEFT  12/21/2022   IR US GUIDE VASC ACCESS LEFT  12/21/2022   MALONEY DILATION N/A 08/07/2016   Procedure: Elease Hashimoto DILATION;  Surgeon: Corbin Ade, MD;  [facility];  Service: Endoscopy;  Laterality: N/A;   MASTECTOMY, PARTIAL Right    PERIPHERAL VASCULAR BALLOON ANGIOPLASTY Left 07/13/2019   Procedure: PERIPHERAL VASCULAR BALLOON ANGIOPLASTY;  Surgeon: Cephus Shelling, MD;  [facility];  Service: Cardiovascular;  Laterality: Left;  arm fistulogram   PERIPHERAL VASCULAR BALLOON ANGIOPLASTY Right 05/22/2020   Procedure: PERIPHERAL VASCULAR BALLOON ANGIOPLASTY;  Surgeon: Leonie Douglas, MD;  [facility];  Service: Cardiovascular;  Laterality: Right;  arm fistula   PERIPHERAL VASCULAR BALLOON ANGIOPLASTY Right 07/05/2020   Procedure: PERIPHERAL VASCULAR BALLOON ANGIOPLASTY;  Surgeon: Sherren Kerns, MD;  [facility];  Service: Cardiovascular;  Laterality: Right;  arm fistula   PORT-A-CATH REMOVAL Left 11/22/2017   Procedure: REMOVAL PORT-A-CATH LEFT CHEST;  Surgeon: Sherren Kerns, MD;  Location: [facility] OR;  Service: Vascular;  Laterality: Left;   RETINAL DETACHMENT SURGERY Right    SCLEROTHERAPY  07/23/2020   Procedure: SCLEROTHERAPY;  Surgeon: Malissa Hippo, MD;  [facility];  Service: Endoscopy;;   UPPER EXTREMITY VENOGRAPHY N/A 08/22/2020   Procedure: LEFT UPPER & CENTRAL VENOGRAPHY;  Surgeon: Cephus Shelling, MD;  [facility];  Service: Cardiovascular;  Laterality: N/A;   Family History:   Family History  Problem Relation Age of Onset   Multiple myeloma Sister    Brain cancer Sister    Dementia Mother        died at 23   Stroke Mother    Heart failure Mother    Diabetes Mother    Heart disease Father    Prostate cancer Brother    Colon cancer Neg Hx    Social History:  reports that she has never smoked. She has never used smokeless tobacco. She reports that she does not drink alcohol and does not use drugs. Allergies  Allergen Reactions   Ace Inhibitors Other (See Comments) and Cough    Tongue swelling Angioedema    Angiotensin Receptor  Blockers Other (See Comments)    Angioedema with ACE-I   Penicillins Other (See Comments)    Unknown reaction   Prior to Admission medications   Medication Sig Start Date End Date Taking? Authorizing Provider  acetaminophen (TYLENOL) 325 MG tablet Take 650 mg by mouth 3 (three) times daily.   Yes [provider]  acyclovir (ZOVIRAX) 200 MG capsule Take 200 mg by mouth in the morning. (0800) 10/20/19  Yes [provider]  anastrozole (ARIMIDEX) 1 MG tablet TAKE 1 TABLET BY MOUTH DAILY 11/05/20  Yes [name], MD  Calcium Carb-Cholecalciferol (CALCIUM + VITAMIN D3) 500-5 MG-MCG TABS Take 1 tablet by mouth daily.   Yes [provider]  melatonin 5 MG TABS Take 5 mg by mouth at bedtime.   Yes [provider]  multivitamin (RENA-VIT) TABS tablet Take 1 tablet by mouth at bedtime.   Yes [provider]  Nutritional Supplements (ENSURE ENLIVE PO) Take 237 mLs by mouth every evening.   Yes [provider]  ondansetron (ZOFRAN-ODT) 4 MG  disintegrating tablet Take 4 mg by mouth daily before breakfast. 01/01/22  Yes [provider]  pantoprazole (PROTONIX) 40 MG tablet Take 40 mg by mouth 2 (two) times daily.   Yes [provider]  sennosides-docusate sodium (SENOKOT-S) 8.6-50 MG tablet Take 1 tablet by mouth 2 (two) times daily.   Yes [provider]  sertraline (ZOLOFT) 25 MG tablet Take 25 mg by mouth daily. Was taking 50 mg but it stopped on 05-25-2023 11/01/22  Yes [provider]  sevelamer carbonate (RENVELA) 800 MG tablet Take 800 mg by mouth daily. 01/05/23  Yes [provider]  sucralfate (CARAFATE) 1 GM/10ML suspension Take 10 mLs by mouth 2 (two) times daily.   Yes [provider]  oseltamivir (TAMIFLU) 30 MG capsule Take 30 mg by mouth daily. 05/17/23   [provider]   Current Facility-Administered Medications  Medication Dose Route Frequency Provider Last Rate Last  Admin   acetaminophen (TYLENOL) tablet 650 mg  650 mg Oral Q6H PRN Emokpae, Ejiroghene E, MD       Or   acetaminophen (TYLENOL) suppository 650 mg  650 mg Rectal Q6H PRN Emokpae, Ejiroghene E, MD       anastrozole (ARIMIDEX) tablet 1 mg  1 mg Oral Daily Emokpae, Ejiroghene E, MD   1 mg at 05/26/23 0900   aspirin EC tablet 81 mg  81 mg Oral Daily Emokpae, Ejiroghene E, MD   81 mg at 05/26/23 0856   atorvastatin (LIPITOR) tablet 40 mg  40 mg Oral Daily Emokpae, Ejiroghene E, MD   40 mg at 05/26/23 0856   calcium-vitamin D (OSCAL WITH D) 500-5 MG-MCG per tablet 1 tablet  1 tablet Oral Daily Emokpae, Ejiroghene E, MD   1 tablet at 05/26/23 0857   Chlorhexidine Gluconate Cloth 2 % PADS 6 each  6 each Topical Q0600 Cleora Fleet, MD   6 each at 05/26/23 4010   Chlorhexidine Gluconate Cloth 2 % PADS 6 each  6 each Topical Q0600 Terrial Rhodes, MD       heparin ADULT infusion 100 units/mL (25000 units/24mL)  1,000 Units/hr Intravenous Continuous Rockwell Alexandria, RPH 10 mL/hr at 05/26/23 0235 1,000 Units/hr at 05/26/23 0235   pantoprazole (PROTONIX) EC tablet 40 mg  40 mg Oral BID Emokpae, Ejiroghene E, MD   40 mg at 05/26/23 0857   polyethylene glycol (MIRALAX / GLYCOLAX) packet 17 g  17 g Oral Daily PRN Emokpae, Ejiroghene E, MD       sevelamer carbonate (RENVELA) tablet 800 mg  800 mg Oral Q supper Emokpae, Ejiroghene E, MD   800 mg at 05/25/23 1746   Facility-Administered Medications Ordered in Other Encounters  Medication Dose Route Frequency Provider Last Rate Last Admin   lanreotide acetate (SOMATULINE DEPOT) 120 MG/0.5ML injection            lanreotide acetate (SOMATULINE DEPOT) 120 MG/0.5ML injection            lanreotide acetate (SOMATULINE DEPOT) 120 MG/0.5ML injection            lanreotide acetate (SOMATULINE DEPOT) 120 MG/0.5ML injection            octreotide (SANDOSTATIN LAR) 30 MG IM injection            octreotide (SANDOSTATIN LAR) 30 MG IM injection            Labs: Basic  Metabolic Panel: Recent Labs  Lab 05/24/23 1352 05/25/23 0437 05/26/23 0524  NA 137 138 137  K 4.3 4.2 4.4  CL 95* 96* 97*  CO2 23 23 23   GLUCOSE 95 75 104*  BUN 42* 50* 61*  CREATININE 9.50* 10.62* 11.95*  CALCIUM 9.6 9.6 9.2  PHOS  --   --  5.3*   Liver Function Tests: Recent Labs  Lab 05/24/23 1352 05/26/23 0524  AST 28  --   ALT 14  --   ALKPHOS 80  --   BILITOT 0.5  --   PROT 7.1  --   ALBUMIN 3.7 3.4*   No results for input(s): "LIPASE", "AMYLASE" in the last 168 hours. No results for input(s): "AMMONIA" in the last 168 hours. CBC: Recent Labs  Lab 05/24/23 1352 05/25/23 0437 05/26/23 0524  WBC 7.6 5.6 5.5  NEUTROABS 6.0  --   --   HGB 11.0* 11.0* 10.7*  HCT 34.3* 34.0* 33.4*  MCV 101.5* 100.9* 100.6*  PLT 209 190 195   Cardiac Enzymes: No results for input(s): "CKTOTAL", "CKMB", "CKMBINDEX", "TROPONINI" in the last 168 hours. CBG: No results for input(s): "GLUCAP" in the last 168 hours. Iron Studies: No results for input(s): "IRON", "TIBC", "TRANSFERRIN", "FERRITIN" in the last 72 hours. Studies/Results: ECHOCARDIOGRAM COMPLETE Result Date: 05/25/2023    ECHOCARDIOGRAM REPORT   Patient Name:   Carly Wood Date of Exam: 05/25/2023 Medical Rec #:  [id number]      Height:       56.0 in Accession #:    [id number]     Weight:       145.5 lb Date of Birth:  02-Dec-1935     BSA:          1.551 m Patient Age:    87 years       BP: Patient Gender: F              HR:           87 bpm. [facility] Procedure: 2D Echo (Both Spectral and Color Flow Doppler were utilized during            procedure). Indications:    Elevated troponin                 Murmur; R01.1 Murmur  History:        Patient has prior history of Echocardiogram examinations.  Sonographer:    BW Referring Phys: [id number] EJIROGHENE E EMOKPAE IMPRESSIONS  1. Hypokinesis of the distal inferoseptum, distal inferior and apical walls     COmpared to echo from 2024, wall motion changes are new and LVEF  is not as vigorous.. Left ventricular ejection fraction, by estimation, is 55 to 60%. The left ventricle has normal function. There is moderate asymmetric left ventricular hypertrophy. Indeterminate diastolic filling due to E-A fusion.  2. Right ventricular systolic function is normal. The right ventricular size is normal. Mildly increased right ventricular wall thickness.  3. Left atrial size was moderately dilated.  4. Trivial mitral valve regurgitation.  5. AV is thickened, calcified with restricted motion Peak and mean gradients through the valve are 21 and 12 mm Hg respectively AVA (VTI) is 1.1 cm2 Dimensionless index is 0.5 consistent with mild AS. 2D imaging suggests that AS is more moderate . The aortic valve is abnormal. Aortic valve regurgitation is not visualized.  6. The inferior vena cava is normal in size with greater than 50% respiratory variability, suggesting right atrial pressure of 3 mmHg. FINDINGS  Left Ventricle: Hypokinesis of the distal inferoseptum, distal inferior and apical walls  COmpared to echo from 2024, wall motion changes are new and LVEF is not as vigorous. Left ventricular ejection fraction, by estimation, is 55 to 60%. The left ventricle has normal function. The left ventricular internal cavity size was normal in size. There is moderate asymmetric left ventricular hypertrophy. Indeterminate diastolic filling due to E-A fusion. Right Ventricle: The right ventricular size is normal. Mildly increased right ventricular wall thickness. Right ventricular systolic function is normal. Left Atrium: Left atrial size was moderately dilated. Right Atrium: Right atrial size was normal in size. Pericardium: There is no evidence of pericardial effusion. Mitral Valve: There is moderate thickening of the mitral valve leaflet(s). There is mild calcification of the mitral valve leaflet(s). Mild to moderate mitral annular calcification. Trivial mitral valve regurgitation. MV peak gradient, 8.2 mmHg.  The mean  mitral valve gradient is 2.0 mmHg. Tricuspid Valve: The tricuspid valve is normal in structure. Tricuspid valve regurgitation is trivial. Aortic Valve: AV is thickened, calcified with restricted motion Peak and mean gradients through the valve are 21 and 12 mm Hg respectively AVA (VTI) is 1.1 cm2 Dimensionless index is 0.5 consistent with mild AS. 2D imaging suggests that AS is more moderate. The aortic valve is abnormal. Aortic valve regurgitation is not visualized. Aortic valve mean gradient measures 11.5 mmHg. Aortic valve peak gradient measures 21.3 mmHg. Aortic valve area, by VTI measures 1.14 cm. Pulmonic Valve: The pulmonic valve was not well visualized. Pulmonic valve regurgitation is not visualized. No evidence of pulmonic stenosis. Aorta: The aortic root is normal in size and structure. Venous: The inferior vena cava is normal in size with greater than 50% respiratory variability, suggesting right atrial pressure of 3 mmHg. IAS/Shunts: No atrial level shunt detected by color flow Doppler.  LEFT VENTRICLE PLAX 2D LVIDd:         3.80 cm LVIDs:         2.50 cm LV PW:         1.10 cm LV IVS:        1.40 cm LVOT diam:     1.70 cm LV SV:         54 LV SV Index:   35 LVOT Area:     2.27 cm  RIGHT VENTRICLE RV S prime:     16.50 cm/s TAPSE (M-mode): 2.4 cm LEFT ATRIUM             Index        RIGHT ATRIUM           Index LA diam:        3.20 cm 2.06 cm/m   RA Area:     11.20 cm LA Vol (A2C):   48.2 ml 31.08 ml/m  RA Volume:   23.50 ml  15.16 ml/m LA Vol (A4C):   94.7 ml 61.07 ml/m LA Biplane Vol: 71.4 ml 46.05 ml/m  AORTIC VALVE AV Area (Vmax):    1.16 cm AV Area (Vmean):   1.10 cm AV Area (VTI):     1.14 cm AV Vmax:           231.00 cm/s AV Vmean:          154.500 cm/s AV VTI:            0.468 m AV Peak Grad:      21.3 mmHg AV Mean Grad:      11.5 mmHg LVOT Vmax:         118.00 cm/s LVOT Vmean:  74.800 cm/s LVOT VTI:          0.236 m LVOT/AV VTI ratio: 0.50  AORTA Ao Root diam: 3.30 cm  MITRAL VALVE MV Area (PHT): 4.80 cm     SHUNTS MV Area VTI:   1.93 cm     Systemic VTI:  0.24 m MV Peak grad:  8.2 mmHg     Systemic Diam: 1.70 cm MV Mean grad:  2.0 mmHg MV Vmax:       1.43 m/s MV Vmean:      61.1 cm/s MV Decel Time: 158 msec MV E velocity: 152.00 cm/s Dietrich Pates MD Electronically signed by Dietrich Pates MD Signature Date/Time: 05/25/2023/5:21:07 PM    Final    DG Knee Complete 4 Views Right Result Date: 05/24/2023 CLINICAL DATA:  Right knee pain status post fall EXAM: RIGHT KNEE - COMPLETE 4 VIEW COMPARISON:  None Available. FINDINGS: Partially imaged right femoral fixation hardware appears intact. No evidence of fracture, dislocation, or joint effusion. Mild tricompartmental degenerative changes of the knee. Soft tissues are unremarkable. Vascular calcifications. IMPRESSION: No acute displaced fracture or dislocation. Electronically Signed   By: Agustin Cree M.D.   On: 05/24/2023 14:57   DG Hip Unilat W or Wo Pelvis 2-3 Views Right Result Date: 05/24/2023 CLINICAL DATA:  Right knee pain, status post fall. EXAM: DG HIP (WITH OR WITHOUT PELVIS) 2-3V RIGHT COMPARISON:  None Available. FINDINGS: Pelvis is intact with normal and symmetric sacroiliac joints. No acute fracture or dislocation. There is coarse trabecular pattern of the right iliac bone, favored to represent underlying Paget's disease. Right femur ORIF metallic hardware noted. Visualized sacral arcuate lines are unremarkable. There are mild degenerative changes of bilateral hip joints characterized by mild-to-moderate joint space narrowing and osteophytosis of the superior acetabulum. No radiopaque foreign bodies. There is stable several well-circumscribed calcific densities overlying the right side of the pelvis, likely representing calcified leiomyomas. IMPRESSION: 1. No acute osseous abnormality of the pelvis or right hip joint. 2. Mild degenerative changes of bilateral hip joints. 3. Paget's disease of the right iliac bone.  Electronically Signed   By: Jules Schick M.D.   On: 05/24/2023 14:54    ROS: Pertinent items are noted in HPI. Physical Exam: Vitals:   05/25/23 2005 05/26/23 0445 05/26/23 1151 05/26/23 1202  BP:  (!) 155/98 (!) 167/85 (!) 169/80  Pulse:  80 80 81  Resp:  20 (!) 23 20  Temp: 99 F (37.2 C) 98 F (36.7 C) 98.5 F (36.9 C)   TempSrc:  Oral    SpO2:  97% 97% 98%  Weight:      Height:          Weight change:   Intake/Output Summary (Last 24 hours) at 05/26/2023 1216 Last data filed at 05/25/2023 1500 Gross per 24 hour  Intake 322.22 ml  Output --  Net 322.22 ml   BP (!) 169/80   Pulse 81   Temp 98.5 F (36.9 C)   Resp 20   Ht 4\' 8"  (1.422 m)   Wt 66 kg   SpO2 98%   BMI 32.62 kg/m  General appearance: no distress and slowed mentation Head: Normocephalic, without obvious abnormality, atraumatic Resp: clear to auscultation bilaterally Cardio: regular rate and rhythm, S1, S2 normal, no murmur, click, rub or gallop GI: soft, non-tender; bowel sounds normal; no masses,  no organomegaly Extremities: extremities normal, atraumatic, no cyanosis or edema Dialysis Access: Left internal jugular TDC  Dialysis Orders: Center: [facility]  on  MWF . EDW 65kg HD Bath 1K/2.5Ca  Time 3:45 Heparin 1000 units bolus then 1000 unts/hr. Access LIJ TDC BFR 300 DFR 500    calcitriol 1 mcg po/HD  Micera 50 mcg IV every 2 weeks Venofer  50 mg IV weekly    Assessment/Plan:  Elevated troponin levels - cardiology following and plan for conservative therapy for now.  ESRD -  will continue with HD on MWF  Hypertension/volume  -  stable  Anemia  - Stable and will continue to follow  Metabolic bone disease -   continue with outpatient meds  Nutrition -  renal diet, carb modified  Right knee pain - xray without fracture.  Per primary  DM type 2 - per primary svc.  Irena Cords, MD [facility] 05/26/2023, 12:16 PM

## 2023-05-26 NOTE — Plan of Care (Signed)

## 2023-05-27 ENCOUNTER — Other Ambulatory Visit: Payer: Self-pay

## 2023-05-27 DIAGNOSIS — M25561 Pain in right knee: Secondary | ICD-10-CM | POA: Diagnosis not present

## 2023-05-27 DIAGNOSIS — R7989 Other specified abnormal findings of blood chemistry: Secondary | ICD-10-CM | POA: Diagnosis not present

## 2023-05-27 DIAGNOSIS — C9000 Multiple myeloma not having achieved remission: Secondary | ICD-10-CM | POA: Diagnosis not present

## 2023-05-27 DIAGNOSIS — Z992 Dependence on renal dialysis: Secondary | ICD-10-CM | POA: Diagnosis not present

## 2023-05-27 LAB — CBC
HCT: 32.8 % — ABNORMAL LOW (ref 36.0–46.0)
Hemoglobin: 10.4 g/dL — ABNORMAL LOW (ref 12.0–15.0)
MCH: 32.4 pg (ref 26.0–34.0)
MCHC: 31.7 g/dL (ref 30.0–36.0)
MCV: 102.2 fL — ABNORMAL HIGH (ref 80.0–100.0)
Platelets: 192 10*3/uL (ref 150–400)
RBC: 3.21 MIL/uL — ABNORMAL LOW (ref 3.87–5.11)
RDW: 15.2 % (ref 11.5–15.5)
WBC: 5.2 10*3/uL (ref 4.0–10.5)
nRBC: 0 % (ref 0.0–0.2)

## 2023-05-27 LAB — RENAL FUNCTION PANEL
Albumin: 3.4 g/dL — ABNORMAL LOW (ref 3.5–5.0)
Anion gap: 15 (ref 5–15)
BUN: 33 mg/dL — ABNORMAL HIGH (ref 8–23)
CO2: 25 mmol/L (ref 22–32)
Calcium: 9.5 mg/dL (ref 8.9–10.3)
Chloride: 97 mmol/L — ABNORMAL LOW (ref 98–111)
Creatinine, Ser: 7.39 mg/dL — ABNORMAL HIGH (ref 0.44–1.00)
GFR, Estimated: 5 mL/min — ABNORMAL LOW (ref >60)
Glucose, Bld: 69 mg/dL — ABNORMAL LOW (ref 70–99)
Phosphorus: 4.2 mg/dL (ref 2.5–4.6)
Potassium: 4.2 mmol/L (ref 3.5–5.1)
Sodium: 137 mmol/L (ref 135–145)

## 2023-05-27 LAB — SEDIMENTATION RATE: Sed Rate: 79 mm/h — ABNORMAL HIGH (ref 0–22)

## 2023-05-27 LAB — C-REACTIVE PROTEIN: CRP: 3.1 mg/dL — ABNORMAL HIGH (ref <1.0)

## 2023-05-27 LAB — HEPARIN LEVEL (UNFRACTIONATED): Heparin Unfractionated: 0.46 [IU]/mL (ref 0.30–0.70)

## 2023-05-27 LAB — URIC ACID: Uric Acid, Serum: 3.4 mg/dL (ref 2.5–7.1)

## 2023-05-27 MED ORDER — ATORVASTATIN CALCIUM 40 MG PO TABS: 40.0000 mg | ORAL_TABLET | Freq: Every day | ORAL | Status: AC

## 2023-05-27 MED ORDER — PREDNISONE 50 MG PO TABS: 50.0000 mg | ORAL_TABLET | Freq: Every day | ORAL | Status: DC

## 2023-05-27 MED ORDER — PREDNISONE 20 MG PO TABS
50.0000 mg | ORAL_TABLET | Freq: Every day | ORAL | Status: DC
  Administered 2023-05-27: 50 mg via ORAL
  Filled 2023-05-27: qty 1

## 2023-05-27 MED ORDER — ASPIRIN 81 MG PO TBEC: 81.0000 mg | DELAYED_RELEASE_TABLET | Freq: Every day | ORAL | Status: AC

## 2023-05-27 NOTE — Progress Notes (Addendum)
 PHARMACY - ANTICOAGULATION CONSULT NOTE  Pharmacy Consult for heparin Indication: chest pain/ACS  Allergies  Allergen Reactions   Ace Inhibitors Other (See Comments) and Cough    Tongue swelling Angioedema    Angiotensin Receptor Blockers Other (See Comments)    Angioedema with ACE-I   Penicillins Other (See Comments)    Unknown reaction    Patient Measurements: Height: 4\' 8"  (142.2 cm) Weight: 66 kg (145 lb 7.7 oz) IBW/kg (Calculated) : 36.3 Heparin Dosing Weight: 52 kg  Vital Signs: Temp: 98.4 F (36.9 C) (03/20 0330) BP: 155/83 (03/20 0330) Pulse Rate: 96 (03/20 0330)  Labs: Recent Labs    05/24/23 1352 05/24/23 1953 05/25/23 0437 05/25/23 0739 05/26/23 0524 05/26/23 1430 05/27/23 0444  HGB 11.0*  --  11.0*  --  10.7*  --  10.4*  HCT 34.3*  --  34.0*  --  33.4*  --  32.8*  PLT 209  --  190  --  195  --  192  HEPARINUNFRC  --   --   --    < > 0.18* 0.69 0.46  CREATININE 9.50*  --  10.62*  --  11.95*  --  7.39*  TROPONINIHS 2,172* 3,755* 4,099*  --   --   --   --    < > = values in this interval not displayed.    Estimated Creatinine Clearance: 4.2 mL/min (A) (by C-G formula based on SCr of 7.39 mg/dL (H)).   Assessment: 60 yoF presented to ED with right knee pain and syncope and found to to have elevated troponin at 2,172 without chest pain. Pharmacy consulted to dose heparin for ACS.  -Hgb 11, plts 190 -Trops 2172>>4099 -ESRD (HD MWF)  Heparin level is therapeutic at 0.46 on 950 units/hour. No issues with infusion reported.  Goal of Therapy:  Heparin level 0.3-0.7 units/ml Monitor platelets by anticoagulation protocol: Yes   Plan:  Continue heparin infusion at 950 units/hr Recheck HL in AM Follow up cardiology plan this am. 48 hour duration completed  Thank you for involving pharmacy in this patient's care.   Judeth Cornfield, PharmD Clinical Pharmacist 05/27/2023 8:16 AM

## 2023-05-27 NOTE — Care Management Important Message (Signed)
 Important Message  Patient Details  Name: Carly Wood MRN: [id number] Date of Birth: September 23, 1935   Important Message Given:  N/A - LOS <3 / Initial given by admissions     Corey Harold 05/27/2023, 2:01 PM

## 2023-05-27 NOTE — Discharge Summary (Signed)
 Physician Discharge Summary   Patient: Carly Wood MRN: [id number] DOB: 1935/11/12  Admit date:     05/24/2023  Discharge date: 05/27/23  Discharge Physician: Onalee Hua Felicita Nuncio   PCP: Sharee Holster, NP       Hospital Course: 88 year old female resident of [facility] with past medical history of ERSD on HD, multiple myeloma, breast cancer, diabetes mellitus, dementia started complaining of knee pain and refused to get up for dialysis and subsequently sent to the ED.  She had no other complaints of chest pain or shortness of breath.  No evidence that she passed out.  She was noted to have an elevated troponin and admitted with concern for NSTEMI. Troponins peaked at 4099.  Cardiology was consulted assist with management.  They did not feel the patient was a candidate for advanced therapy.  The patient was started on IV heparin for 48 hours. Regarding the patient's knee pain, it was difficult to clarify the chronicity and validity due to her cognitive impairment.  She also complained of left kneed pain intermittently which confounded the situation. Nevertheless, clinical suspicion for septic arthritis was extremely low.  Uric acid was 3.4, CRP 3.1.  Xray was negative for any bony abnormality.  Nevertheless, decision was may to give empiric short course (3 days) of prednisone.  She completed 3 days IV heparin and transitioned to ASA 81 mg.  Assessment and Plan:  NSTEMI -troponin peaked 4099 -appreciate cardiology consult>>not a candidate for advanced therapy -finished 48 hours IV heparin -continue ASA, lipitor -3/18 Echo--EF 55-60%, HK distal inferoseptal, mild AS, normal RVF   Right knee pain and Left knee pain? -Right knee x-ray-no acute displaced fracture or dislocation. -doubt septic arthritis -exam is unremarkable for edema, synovitis, erythema -check  Nursing home staff denies falls or syncope.  Patient has vascular dementia with behavioral disturbance, per nursing home staff baseline  she has episodes of confusion.  uric acid 3.4 -empiric tx with 3 days prednisone   Breast cancer Resume anastrozole   Type 2 diabetes mellitus with renal complications -diet controlled -04/01/23 A1C--4.9   ESRD on HD  Dialysis schedule Monday Wednesday Friday - nephrology consult requested for inpatient HD - resume renal meds - received HD 3/19   Hypertension due to end stage renal disease caused by type 2 diabetes mellitus, on dialysis  - following BP closely    Multiple myeloma  Follows with Dr. Ellin Saba.  On Velcade every other week. -last infusion 05/18/23   Dyslipidemia -continue statin   Major Neurocognitive Disorder -review of medical record shows she occasionally has invalidated responses          Consultants: renal Procedures performed: none  Disposition: Skilled nursing facility Diet recommendation:  Renal diet DISCHARGE MEDICATION: Allergies as of 05/27/2023       Reactions   Ace Inhibitors Other (See Comments), Cough   Tongue swelling Angioedema    Angiotensin Receptor Blockers Other (See Comments)   Angioedema with ACE-I   Penicillins Other (See Comments)   Unknown reaction        Medication List     STOP taking these medications    oseltamivir 30 MG capsule Commonly known as: TAMIFLU       TAKE these medications    acetaminophen 325 MG tablet Commonly known as: TYLENOL Take 650 mg by mouth 3 (three) times daily.   acyclovir 200 MG capsule Commonly known as: ZOVIRAX Take 200 mg by mouth in the morning. (0800)   anastrozole 1 MG tablet Commonly  known as: ARIMIDEX TAKE 1 TABLET BY MOUTH DAILY   aspirin EC 81 MG tablet Take 1 tablet (81 mg total) by mouth daily. Swallow whole. Start taking on: May 28, 2023   atorvastatin 40 MG tablet Commonly known as: LIPITOR Take 1 tablet (40 mg total) by mouth daily. Start taking on: May 28, 2023   Calcium + Vitamin D3 500-5 MG-MCG Tabs Generic drug: Calcium  Carb-Cholecalciferol Take 1 tablet by mouth daily.   ENSURE ENLIVE PO Take 237 mLs by mouth every evening.   melatonin 5 MG Tabs Take 5 mg by mouth at bedtime.   multivitamin Tabs tablet Take 1 tablet by mouth at bedtime.   ondansetron 4 MG disintegrating tablet Commonly known as: ZOFRAN-ODT Take 4 mg by mouth daily before breakfast.   pantoprazole 40 MG tablet Commonly known as: PROTONIX Take 40 mg by mouth 2 (two) times daily.   predniSONE 50 MG tablet Commonly known as: DELTASONE Take 1 tablet (50 mg total) by mouth daily with breakfast.   sennosides-docusate sodium 8.6-50 MG tablet Commonly known as: SENOKOT-S Take 1 tablet by mouth 2 (two) times daily.   sertraline 25 MG tablet Commonly known as: ZOLOFT Take 25 mg by mouth daily. Was taking 50 mg but it stopped on 05-25-2023   sevelamer carbonate 800 MG tablet Commonly known as: RENVELA Take 800 mg by mouth daily.   sucralfate 1 GM/10ML suspension Commonly known as: CARAFATE Take 10 mLs by mouth 2 (two) times daily.        Discharge Exam: Filed Weights   05/24/23 1222  Weight: 66 kg   HEENT:  [place]/AT, No thrush, no icterus CV:  RRR, no rub, no S3, no S4 Lung:  CTA, no wheeze, no rhonchi Abd:  soft/+BS, NT Ext:  No edema, no lymphangitis, no synovitis, no rash   Condition at discharge: stable  The results of significant diagnostics from this hospitalization (including imaging, microbiology, ancillary and laboratory) are listed below for reference.   Imaging Studies: ECHOCARDIOGRAM COMPLETE Result Date: 05/25/2023    ECHOCARDIOGRAM REPORT   Patient Name:   Carly Wood Date of Exam: 05/25/2023 Medical Rec #:  [id number]      Height:       56.0 in Accession #:    [id number]     Weight:       145.5 lb Date of Birth:  24-Oct-1935     BSA:          1.551 m Patient Age:    87 years       BP: Patient Gender: F              HR:           87 bpm. [facility] Procedure: 2D Echo (Both Spectral and  Color Flow Doppler were utilized during            procedure). Indications:    Elevated troponin                 Murmur; R01.1 Murmur  History:        Patient has prior history of Echocardiogram examinations.  Sonographer:    BW Referring Phys: [id number] EJIROGHENE E EMOKPAE IMPRESSIONS  1. Hypokinesis of the distal inferoseptum, distal inferior and apical walls     COmpared to echo from 2024, wall motion changes are new and LVEF is not as vigorous.. Left ventricular ejection fraction, by estimation, is 55 to 60%. The left ventricle has normal function.  There is moderate asymmetric left ventricular hypertrophy. Indeterminate diastolic filling due to E-A fusion.  2. Right ventricular systolic function is normal. The right ventricular size is normal. Mildly increased right ventricular wall thickness.  3. Left atrial size was moderately dilated.  4. Trivial mitral valve regurgitation.  5. AV is thickened, calcified with restricted motion Peak and mean gradients through the valve are 21 and 12 mm Hg respectively AVA (VTI) is 1.1 cm2 Dimensionless index is 0.5 consistent with mild AS. 2D imaging suggests that AS is more moderate . The aortic valve is abnormal. Aortic valve regurgitation is not visualized.  6. The inferior vena cava is normal in size with greater than 50% respiratory variability, suggesting right atrial pressure of 3 mmHg. FINDINGS  Left Ventricle: Hypokinesis of the distal inferoseptum, distal inferior and apical walls COmpared to echo from 2024, wall motion changes are new and LVEF is not as vigorous. Left ventricular ejection fraction, by estimation, is 55 to 60%. The left ventricle has normal function. The left ventricular internal cavity size was normal in size. There is moderate asymmetric left ventricular hypertrophy. Indeterminate diastolic filling due to E-A fusion. Right Ventricle: The right ventricular size is normal. Mildly increased right ventricular wall thickness. Right ventricular systolic  function is normal. Left Atrium: Left atrial size was moderately dilated. Right Atrium: Right atrial size was normal in size. Pericardium: There is no evidence of pericardial effusion. Mitral Valve: There is moderate thickening of the mitral valve leaflet(s). There is mild calcification of the mitral valve leaflet(s). Mild to moderate mitral annular calcification. Trivial mitral valve regurgitation. MV peak gradient, 8.2 mmHg. The mean  mitral valve gradient is 2.0 mmHg. Tricuspid Valve: The tricuspid valve is normal in structure. Tricuspid valve regurgitation is trivial. Aortic Valve: AV is thickened, calcified with restricted motion Peak and mean gradients through the valve are 21 and 12 mm Hg respectively AVA (VTI) is 1.1 cm2 Dimensionless index is 0.5 consistent with mild AS. 2D imaging suggests that AS is more moderate. The aortic valve is abnormal. Aortic valve regurgitation is not visualized. Aortic valve mean gradient measures 11.5 mmHg. Aortic valve peak gradient measures 21.3 mmHg. Aortic valve area, by VTI measures 1.14 cm. Pulmonic Valve: The pulmonic valve was not well visualized. Pulmonic valve regurgitation is not visualized. No evidence of pulmonic stenosis. Aorta: The aortic root is normal in size and structure. Venous: The inferior vena cava is normal in size with greater than 50% respiratory variability, suggesting right atrial pressure of 3 mmHg. IAS/Shunts: No atrial level shunt detected by color flow Doppler.  LEFT VENTRICLE PLAX 2D LVIDd:         3.80 cm LVIDs:         2.50 cm LV PW:         1.10 cm LV IVS:        1.40 cm LVOT diam:     1.70 cm LV SV:         54 LV SV Index:   35 LVOT Area:     2.27 cm  RIGHT VENTRICLE RV S prime:     16.50 cm/s TAPSE (M-mode): 2.4 cm LEFT ATRIUM             Index        RIGHT ATRIUM           Index LA diam:        3.20 cm 2.06 cm/m   RA Area:     11.20 cm LA Vol (A2C):  48.2 ml 31.08 ml/m  RA Volume:   23.50 ml  15.16 ml/m LA Vol (A4C):   94.7 ml 61.07  ml/m LA Biplane Vol: 71.4 ml 46.05 ml/m  AORTIC VALVE AV Area (Vmax):    1.16 cm AV Area (Vmean):   1.10 cm AV Area (VTI):     1.14 cm AV Vmax:           231.00 cm/s AV Vmean:          154.500 cm/s AV VTI:            0.468 m AV Peak Grad:      21.3 mmHg AV Mean Grad:      11.5 mmHg LVOT Vmax:         118.00 cm/s LVOT Vmean:        74.800 cm/s LVOT VTI:          0.236 m LVOT/AV VTI ratio: 0.50  AORTA Ao Root diam: 3.30 cm MITRAL VALVE MV Area (PHT): 4.80 cm     SHUNTS MV Area VTI:   1.93 cm     Systemic VTI:  0.24 m MV Peak grad:  8.2 mmHg     Systemic Diam: 1.70 cm MV Mean grad:  2.0 mmHg MV Vmax:       1.43 m/s MV Vmean:      61.1 cm/s MV Decel Time: 158 msec MV E velocity: 152.00 cm/s Dietrich Pates MD Electronically signed by Dietrich Pates MD Signature Date/Time: 05/25/2023/5:21:07 PM    Final    DG Knee Complete 4 Views Right Result Date: 05/24/2023 CLINICAL DATA:  Right knee pain status post fall EXAM: RIGHT KNEE - COMPLETE 4 VIEW COMPARISON:  None Available. FINDINGS: Partially imaged right femoral fixation hardware appears intact. No evidence of fracture, dislocation, or joint effusion. Mild tricompartmental degenerative changes of the knee. Soft tissues are unremarkable. Vascular calcifications. IMPRESSION: No acute displaced fracture or dislocation. Electronically Signed   By: Agustin Cree M.D.   On: 05/24/2023 14:57   DG Hip Unilat W or Wo Pelvis 2-3 Views Right Result Date: 05/24/2023 CLINICAL DATA:  Right knee pain, status post fall. EXAM: DG HIP (WITH OR WITHOUT PELVIS) 2-3V RIGHT COMPARISON:  None Available. FINDINGS: Pelvis is intact with normal and symmetric sacroiliac joints. No acute fracture or dislocation. There is coarse trabecular pattern of the right iliac bone, favored to represent underlying Paget's disease. Right femur ORIF metallic hardware noted. Visualized sacral arcuate lines are unremarkable. There are mild degenerative changes of bilateral hip joints characterized by  mild-to-moderate joint space narrowing and osteophytosis of the superior acetabulum. No radiopaque foreign bodies. There is stable several well-circumscribed calcific densities overlying the right side of the pelvis, likely representing calcified leiomyomas. IMPRESSION: 1. No acute osseous abnormality of the pelvis or right hip joint. 2. Mild degenerative changes of bilateral hip joints. 3. Paget's disease of the right iliac bone. Electronically Signed   By: Jules Schick M.D.   On: 05/24/2023 14:54    Microbiology: Results for orders placed or performed during the hospital encounter of 05/24/23  MRSA Next Gen by PCR, Nasal     Status: None   Collection Time: 05/24/23  6:45 PM   Specimen: Nasal Mucosa; Nasal Swab  Result Value Ref Range Status   MRSA by PCR Next Gen NOT DETECTED NOT DETECTED Final    Comment: (NOTE) The GeneXpert MRSA Assay (FDA approved for NASAL specimens only), is one component of a comprehensive MRSA colonization surveillance program. It is  not intended to diagnose MRSA infection nor to guide or monitor treatment for MRSA infections. Test performance is not FDA approved in patients less than 15 years old. Performed at [facility], [address], [place], [place] [address]    *Note: Due to a large number of results and/or encounters for the requested time period, some results have not been displayed. A complete set of results can be found in Results Review.    Labs: CBC: Recent Labs  Lab 05/24/23 1352 05/25/23 0437 05/26/23 0524 05/27/23 0444  WBC 7.6 5.6 5.5 5.2  NEUTROABS 6.0  --   --   --   HGB 11.0* 11.0* 10.7* 10.4*  HCT 34.3* 34.0* 33.4* 32.8*  MCV 101.5* 100.9* 100.6* 102.2*  PLT 209 190 195 192   Basic Metabolic Panel: Recent Labs  Lab 05/24/23 1352 05/25/23 0437 05/26/23 0524 05/27/23 0444  NA 137 138 137 137  K 4.3 4.2 4.4 4.2  CL 95* 96* 97* 97*  CO2 23 23 23 25   GLUCOSE 95 75 104* 69*  BUN 42* 50* 61* 33*  CREATININE 9.50* 10.62*  11.95* 7.39*  CALCIUM 9.6 9.6 9.2 9.5  MG 2.7*  --   --   --   PHOS  --   --  5.3* 4.2   Liver Function Tests: Recent Labs  Lab 05/24/23 1352 05/26/23 0524 05/27/23 0444  AST 28  --   --   ALT 14  --   --   ALKPHOS 80  --   --   BILITOT 0.5  --   --   PROT 7.1  --   --   ALBUMIN 3.7 3.4* 3.4*   CBG: No results for input(s): "GLUCAP" in the last 168 hours.  Discharge time spent: greater than 30 minutes.  Signed: Catarina Hartshorn, MD [facility] 05/27/2023

## 2023-05-27 NOTE — Progress Notes (Signed)
 [facility] Progress Note    Assessment/ Plan:    NSTEMI- cardiology following and plan for conservative therapy for now. Not a candidate for advanced therapies  ESRD -  will continue with HD on MWF  Hypertension/volume  -  stable  Anemia  - Stable and will continue to follow  Metabolic bone disease -   continue with outpatient meds  Nutrition -  renal diet, carb modified  Right knee pain - xray without fracture.  Per primary  DM type 2 - per primary svc.   Outpatient Dialysis Orders: Center: [facility]  on MWF . EDW 65kg HD [place] 1K/2.5Ca  Time 3:45 Heparin 1000 units bolus then 1000 unts/hr. Access [facility] TDC BFR 300 DFR 500    calcitriol 1 mcg po/HD  Micera 50 mcg IV every 2 weeks Venofer  50 mg IV weekly   Subjective:   Patient seen an examined bedside. Tolerated hd yesterday, net uf 2L. No complaints   Objective:   BP (!) 155/83   Pulse 96   Temp 98.4 F (36.9 C)   Resp 18   Ht 4\' 8"  (1.422 m)   Wt 66 kg   SpO2 96%   BMI 32.62 kg/m   Intake/Output Summary (Last 24 hours) at 05/27/2023 0740 Last data filed at 05/27/2023 0500 Gross per 24 hour  Intake 360 ml  Output 2000 ml  Net -1640 ml   Weight change:   Physical Exam: Gen: nad CVS: rrr Resp: cta b/l  Abd: obese, soft Ext: no sig edema Neuro: awake, alert Dialysis access: [facility]  Imaging: ECHOCARDIOGRAM COMPLETE Result Date: 05/25/2023    ECHOCARDIOGRAM REPORT   Patient Name:   Carly Wood Date of Exam: 05/25/2023 Medical Rec #:  [id number]      Height:       56.0 in Accession #:    [id number]     Weight:       145.5 lb Date of Birth:  Jul 18, 1935     BSA:          1.551 m Patient Age:    88 years       BP: Patient Gender: F              HR:           87 bpm. [facility] Procedure: 2D Echo (Both Spectral and Color Flow Doppler were utilized during            procedure). Indications:    Elevated troponin                 Murmur; R01.1 Murmur  History:        Patient has prior  history of Echocardiogram examinations.  Sonographer:    BW Referring Phys: [id number] EJIROGHENE E EMOKPAE IMPRESSIONS  1. Hypokinesis of the distal inferoseptum, distal inferior and apical walls     COmpared to echo from 2024, wall motion changes are new and LVEF is not as vigorous.. Left ventricular ejection fraction, by estimation, is 55 to 60%. The left ventricle has normal function. There is moderate asymmetric left ventricular hypertrophy. Indeterminate diastolic filling due to E-A fusion.  2. Right ventricular systolic function is normal. The right ventricular size is normal. Mildly increased right ventricular wall thickness.  3. Left atrial size was moderately dilated.  4. Trivial mitral valve regurgitation.  5. AV is thickened, calcified with restricted motion Peak and mean gradients through the valve are 21 and 12 mm  Hg respectively AVA (VTI) is 1.1 cm2 Dimensionless index is 0.5 consistent with mild AS. 2D imaging suggests that AS is more moderate . The aortic valve is abnormal. Aortic valve regurgitation is not visualized.  6. The inferior vena cava is normal in size with greater than 50% respiratory variability, suggesting right atrial pressure of 3 mmHg. FINDINGS  Left Ventricle: Hypokinesis of the distal inferoseptum, distal inferior and apical walls COmpared to echo from 2024, wall motion changes are new and LVEF is not as vigorous. Left ventricular ejection fraction, by estimation, is 55 to 60%. The left ventricle has normal function. The left ventricular internal cavity size was normal in size. There is moderate asymmetric left ventricular hypertrophy. Indeterminate diastolic filling due to E-A fusion. Right Ventricle: The right ventricular size is normal. Mildly increased right ventricular wall thickness. Right ventricular systolic function is normal. Left Atrium: Left atrial size was moderately dilated. Right Atrium: Right atrial size was normal in size. Pericardium: There is no evidence of  pericardial effusion. Mitral Valve: There is moderate thickening of the mitral valve leaflet(s). There is mild calcification of the mitral valve leaflet(s). Mild to moderate mitral annular calcification. Trivial mitral valve regurgitation. MV peak gradient, 8.2 mmHg. The mean  mitral valve gradient is 2.0 mmHg. Tricuspid Valve: The tricuspid valve is normal in structure. Tricuspid valve regurgitation is trivial. Aortic Valve: AV is thickened, calcified with restricted motion Peak and mean gradients through the valve are 21 and 12 mm Hg respectively AVA (VTI) is 1.1 cm2 Dimensionless index is 0.5 consistent with mild AS. 2D imaging suggests that AS is more moderate. The aortic valve is abnormal. Aortic valve regurgitation is not visualized. Aortic valve mean gradient measures 11.5 mmHg. Aortic valve peak gradient measures 21.3 mmHg. Aortic valve area, by VTI measures 1.14 cm. Pulmonic Valve: The pulmonic valve was not well visualized. Pulmonic valve regurgitation is not visualized. No evidence of pulmonic stenosis. Aorta: The aortic root is normal in size and structure. Venous: The inferior vena cava is normal in size with greater than 50% respiratory variability, suggesting right atrial pressure of 3 mmHg. IAS/Shunts: No atrial level shunt detected by color flow Doppler.  LEFT VENTRICLE PLAX 2D LVIDd:         3.80 cm LVIDs:         2.50 cm LV PW:         1.10 cm LV IVS:        1.40 cm LVOT diam:     1.70 cm LV SV:         54 LV SV Index:   35 LVOT Area:     2.27 cm  RIGHT VENTRICLE RV S prime:     16.50 cm/s TAPSE (M-mode): 2.4 cm LEFT ATRIUM             Index        RIGHT ATRIUM           Index LA diam:        3.20 cm 2.06 cm/m   RA Area:     11.20 cm LA Vol (A2C):   48.2 ml 31.08 ml/m  RA Volume:   23.50 ml  15.16 ml/m LA Vol (A4C):   94.7 ml 61.07 ml/m LA Biplane Vol: 71.4 ml 46.05 ml/m  AORTIC VALVE AV Area (Vmax):    1.16 cm AV Area (Vmean):   1.10 cm AV Area (VTI):     1.14 cm AV Vmax:  231.00 cm/s AV Vmean:          154.500 cm/s AV VTI:            0.468 m AV Peak Grad:      21.3 mmHg AV Mean Grad:      11.5 mmHg LVOT Vmax:         118.00 cm/s LVOT Vmean:        74.800 cm/s LVOT VTI:          0.236 m LVOT/AV VTI ratio: 0.50  AORTA Ao Root diam: 3.30 cm MITRAL VALVE MV Area (PHT): 4.80 cm     SHUNTS MV Area VTI:   1.93 cm     Systemic VTI:  0.24 m MV Peak grad:  8.2 mmHg     Systemic Diam: 1.70 cm MV Mean grad:  2.0 mmHg MV Vmax:       1.43 m/s MV Vmean:      61.1 cm/s MV Decel Time: 158 msec MV E velocity: 152.00 cm/s Dietrich Pates MD Electronically signed by Dietrich Pates MD Signature Date/Time: 05/25/2023/5:21:07 PM    Final     Labs: BMET Recent Labs  Lab 05/24/23 1352 05/25/23 0437 05/26/23 0524 05/27/23 0444  NA 137 138 137 137  K 4.3 4.2 4.4 4.2  CL 95* 96* 97* 97*  CO2 23 23 23 25   GLUCOSE 95 75 104* 69*  BUN 42* 50* 61* 33*  CREATININE 9.50* 10.62* 11.95* 7.39*  CALCIUM 9.6 9.6 9.2 9.5  PHOS  --   --  5.3* 4.2   CBC Recent Labs  Lab 05/24/23 1352 05/25/23 0437 05/26/23 0524 05/27/23 0444  WBC 7.6 5.6 5.5 5.2  NEUTROABS 6.0  --   --   --   HGB 11.0* 11.0* 10.7* 10.4*  HCT 34.3* 34.0* 33.4* 32.8*  MCV 101.5* 100.9* 100.6* 102.2*  PLT 209 190 195 192    Medications:     anastrozole  1 mg Oral Daily   aspirin EC  81 mg Oral Daily   atorvastatin  40 mg Oral Daily   calcium-vitamin D  1 tablet Oral Daily   Chlorhexidine Gluconate Cloth  6 each Topical Q0600   Chlorhexidine Gluconate Cloth  6 each Topical Q0600   pantoprazole  40 mg Oral BID   sevelamer carbonate  800 mg Oral Q supper      Anthony Sar, MD [facility] 05/27/2023, 7:40 AM

## 2023-05-27 NOTE — Plan of Care (Signed)

## 2023-05-27 NOTE — TOC Transition Note (Signed)
 Transition of Care [facility]) - Discharge Note   Patient Details  Name: Carly Wood MRN: [id number] Date of Birth: 1935-05-14  [facility]) CM/SW Contact:  Elliot Gault, LCSW Phone Number: 05/27/2023, 12:57 PM   Clinical Narrative:     Pt medically stable for dc per MD. Plan remains for return to [facility] long term care.  Updated pt's sister, Pattricia Boss, who is in agreement with dc plan. Updated Kerri at [facility] and they can admit pt today.  DC clinical sent electronically. RN to call report.  No other TOC needs for dc.  Final next level of care: Long Term Nursing Home Barriers to Discharge: Barriers Resolved   Patient Goals and CMS Choice Patient states their goals for this hospitalization and ongoing recovery are:: return to SNF   Choice offered to / list presented to : Sibling Saunders ownership interest in [facility].provided to:: Sibling    Discharge Placement                       Discharge Plan and Services Additional resources added to the After Visit Summary for   In-house Referral: Clinical Social Work   Post Acute Care Choice: Resumption of Svcs/PTA Provider                               Social Drivers of Health (SDOH) Interventions SDOH Screenings   Food Insecurity: Patient Unable To Answer (05/24/2023)  Housing: Patient Unable To Answer (05/24/2023)  Transportation Needs: Patient Unable To Answer (05/24/2023)  Utilities: Patient Unable To Answer (05/24/2023)  Alcohol Screen: Low Risk  (01/09/2020)  Depression (PHQ2-9): Low Risk  (03/17/2023)  Financial Resource Strain: Low Risk  (01/09/2020)  Physical Activity: Inactive (01/09/2020)  Social Connections: Patient Unable To Answer (05/24/2023)  Stress: No Stress Concern Present (01/09/2020)  Tobacco Use: Low Risk  (05/24/2023)     Readmission Risk Interventions    05/25/2023    9:03 AM 10/03/2021   12:36 PM  Readmission Risk Prevention Plan  Transportation Screening  Complete Complete  PCP or Specialist Appt within 3-5 Days  Complete  HRI or Home Care Consult Complete Complete  Social Work Consult for Recovery Care Planning/Counseling Complete Complete  Palliative Care Screening Not Applicable Complete  Medication Review Oceanographer) Complete Complete

## 2023-05-27 NOTE — Progress Notes (Signed)
 Report called to Vickie LPN at the [facility].

## 2023-05-28 DIAGNOSIS — Z9181 History of falling: Secondary | ICD-10-CM | POA: Diagnosis not present

## 2023-05-28 DIAGNOSIS — I214 Non-ST elevation (NSTEMI) myocardial infarction: Secondary | ICD-10-CM | POA: Diagnosis not present

## 2023-05-28 DIAGNOSIS — R2681 Unsteadiness on feet: Secondary | ICD-10-CM | POA: Diagnosis not present

## 2023-05-28 DIAGNOSIS — A419 Sepsis, unspecified organism: Secondary | ICD-10-CM | POA: Diagnosis not present

## 2023-05-28 DIAGNOSIS — N186 End stage renal disease: Secondary | ICD-10-CM | POA: Diagnosis not present

## 2023-05-28 DIAGNOSIS — M6281 Muscle weakness (generalized): Secondary | ICD-10-CM | POA: Diagnosis not present

## 2023-05-28 DIAGNOSIS — R279 Unspecified lack of coordination: Secondary | ICD-10-CM | POA: Diagnosis not present

## 2023-05-28 DIAGNOSIS — Z992 Dependence on renal dialysis: Secondary | ICD-10-CM | POA: Diagnosis not present

## 2023-05-28 LAB — HEPATITIS B SURFACE ANTIBODY, QUANTITATIVE: Hep B S AB Quant (Post): <3.5 m[IU]/mL — ABNORMAL LOW

## 2023-05-31 DIAGNOSIS — Z9181 History of falling: Secondary | ICD-10-CM | POA: Diagnosis not present

## 2023-05-31 DIAGNOSIS — M6281 Muscle weakness (generalized): Secondary | ICD-10-CM | POA: Diagnosis not present

## 2023-05-31 DIAGNOSIS — I214 Non-ST elevation (NSTEMI) myocardial infarction: Secondary | ICD-10-CM | POA: Diagnosis not present

## 2023-05-31 DIAGNOSIS — R279 Unspecified lack of coordination: Secondary | ICD-10-CM | POA: Diagnosis not present

## 2023-05-31 DIAGNOSIS — R2681 Unsteadiness on feet: Secondary | ICD-10-CM | POA: Diagnosis not present

## 2023-05-31 DIAGNOSIS — N186 End stage renal disease: Secondary | ICD-10-CM | POA: Diagnosis not present

## 2023-05-31 DIAGNOSIS — E119 Type 2 diabetes mellitus without complications: Secondary | ICD-10-CM | POA: Diagnosis not present

## 2023-05-31 DIAGNOSIS — A419 Sepsis, unspecified organism: Secondary | ICD-10-CM | POA: Diagnosis not present

## 2023-05-31 DIAGNOSIS — Z992 Dependence on renal dialysis: Secondary | ICD-10-CM | POA: Diagnosis not present

## 2023-06-01 ENCOUNTER — Encounter: Payer: Self-pay | Admitting: Internal Medicine

## 2023-06-01 ENCOUNTER — Non-Acute Institutional Stay (SKILLED_NURSING_FACILITY): Payer: Self-pay | Admitting: Internal Medicine

## 2023-06-01 ENCOUNTER — Inpatient Hospital Stay: Payer: Medicare PPO

## 2023-06-01 VITALS — BP 132/67 | HR 102 | Temp 98.8°F | Resp 20

## 2023-06-01 DIAGNOSIS — Z992 Dependence on renal dialysis: Secondary | ICD-10-CM

## 2023-06-01 DIAGNOSIS — Z9181 History of falling: Secondary | ICD-10-CM | POA: Diagnosis not present

## 2023-06-01 DIAGNOSIS — E1122 Type 2 diabetes mellitus with diabetic chronic kidney disease: Secondary | ICD-10-CM | POA: Diagnosis not present

## 2023-06-01 DIAGNOSIS — I120 Hypertensive chronic kidney disease with stage 5 chronic kidney disease or end stage renal disease: Secondary | ICD-10-CM

## 2023-06-01 DIAGNOSIS — C9000 Multiple myeloma not having achieved remission: Secondary | ICD-10-CM | POA: Diagnosis not present

## 2023-06-01 DIAGNOSIS — E1165 Type 2 diabetes mellitus with hyperglycemia: Secondary | ICD-10-CM | POA: Diagnosis not present

## 2023-06-01 DIAGNOSIS — I214 Non-ST elevation (NSTEMI) myocardial infarction: Secondary | ICD-10-CM | POA: Diagnosis not present

## 2023-06-01 DIAGNOSIS — M25561 Pain in right knee: Secondary | ICD-10-CM

## 2023-06-01 DIAGNOSIS — I350 Nonrheumatic aortic (valve) stenosis: Secondary | ICD-10-CM

## 2023-06-01 DIAGNOSIS — N186 End stage renal disease: Secondary | ICD-10-CM | POA: Diagnosis not present

## 2023-06-01 DIAGNOSIS — Z5112 Encounter for antineoplastic immunotherapy: Secondary | ICD-10-CM | POA: Diagnosis not present

## 2023-06-01 DIAGNOSIS — A419 Sepsis, unspecified organism: Secondary | ICD-10-CM | POA: Diagnosis not present

## 2023-06-01 DIAGNOSIS — M6281 Muscle weakness (generalized): Secondary | ICD-10-CM | POA: Diagnosis not present

## 2023-06-01 DIAGNOSIS — F01518 Vascular dementia, unspecified severity, with other behavioral disturbance: Secondary | ICD-10-CM

## 2023-06-01 DIAGNOSIS — R2681 Unsteadiness on feet: Secondary | ICD-10-CM | POA: Diagnosis not present

## 2023-06-01 DIAGNOSIS — R279 Unspecified lack of coordination: Secondary | ICD-10-CM | POA: Diagnosis not present

## 2023-06-01 LAB — CBC WITH DIFFERENTIAL/PLATELET
Abs Immature Granulocytes: 0.02 10*3/uL (ref 0.00–0.07)
Basophils Absolute: 0 10*3/uL (ref 0.0–0.1)
Basophils Relative: 1 %
Eosinophils Absolute: 0.3 10*3/uL (ref 0.0–0.5)
Eosinophils Relative: 4 %
HCT: 35.1 % — ABNORMAL LOW (ref 36.0–46.0)
Hemoglobin: 11.7 g/dL — ABNORMAL LOW (ref 12.0–15.0)
Immature Granulocytes: 0 %
Lymphocytes Relative: 18 %
Lymphs Abs: 1 10*3/uL (ref 0.7–4.0)
MCH: 33.7 pg (ref 26.0–34.0)
MCHC: 33.3 g/dL (ref 30.0–36.0)
MCV: 101.2 fL — ABNORMAL HIGH (ref 80.0–100.0)
Monocytes Absolute: 0.5 10*3/uL (ref 0.1–1.0)
Monocytes Relative: 9 %
Neutro Abs: 3.9 10*3/uL (ref 1.7–7.7)
Neutrophils Relative %: 68 %
Platelets: 290 10*3/uL (ref 150–400)
RBC: 3.47 MIL/uL — ABNORMAL LOW (ref 3.87–5.11)
RDW: 15.1 % (ref 11.5–15.5)
WBC: 5.7 10*3/uL (ref 4.0–10.5)
nRBC: 0 % (ref 0.0–0.2)

## 2023-06-01 LAB — COMPREHENSIVE METABOLIC PANEL
ALT: 14 U/L (ref 0–44)
AST: 21 U/L (ref 15–41)
Albumin: 3.7 g/dL (ref 3.5–5.0)
Alkaline Phosphatase: 73 U/L (ref 38–126)
Anion gap: 17 — ABNORMAL HIGH (ref 5–15)
BUN: 33 mg/dL — ABNORMAL HIGH (ref 8–23)
CO2: 26 mmol/L (ref 22–32)
Calcium: 9.2 mg/dL (ref 8.9–10.3)
Chloride: 93 mmol/L — ABNORMAL LOW (ref 98–111)
Creatinine, Ser: 6.63 mg/dL — ABNORMAL HIGH (ref 0.44–1.00)
GFR, Estimated: 6 mL/min — ABNORMAL LOW (ref >60)
Glucose, Bld: 228 mg/dL — ABNORMAL HIGH (ref 70–99)
Potassium: 4.3 mmol/L (ref 3.5–5.1)
Sodium: 136 mmol/L (ref 135–145)
Total Bilirubin: 0.4 mg/dL (ref 0.0–1.2)
Total Protein: 7.3 g/dL (ref 6.5–8.1)

## 2023-06-01 LAB — MAGNESIUM: Magnesium: 2.3 mg/dL (ref 1.7–2.4)

## 2023-06-01 MED ORDER — PROCHLORPERAZINE MALEATE 10 MG PO TABS
10.0000 mg | ORAL_TABLET | Freq: Once | ORAL | Status: AC
  Administered 2023-06-01: 10 mg via ORAL
  Filled 2023-06-01: qty 1

## 2023-06-01 MED ORDER — BORTEZOMIB CHEMO SQ INJECTION 3.5 MG (2.5MG/ML)
1.2000 mg/m2 | Freq: Once | INTRAMUSCULAR | Status: AC
Start: 2023-06-01 — End: 2023-06-01
  Administered 2023-06-01: 2 mg via SUBCUTANEOUS
  Filled 2023-06-01: qty 0.8

## 2023-06-01 MED ORDER — INSULIN ASPART 100 UNIT/ML IJ SOLN
10.0000 [IU] | Freq: Once | INTRAMUSCULAR | Status: AC
  Administered 2023-06-01: 10 [IU] via SUBCUTANEOUS
  Filled 2023-06-01: qty 1

## 2023-06-01 NOTE — Assessment & Plan Note (Signed)
 Today she maintains that she did pass out at dialysis after that event initiated resulting in trauma to the right knee.  She seemed rather emphatic about the story; yet she denied having left the facility today even though she had been at the oncology office receiving an infusion.

## 2023-06-01 NOTE — Patient Instructions (Signed)
 [facility] [place] - A DEPT OF [facility]  Discharge Instructions: Thank you for choosing [facility] to provide your oncology and hematology care.  If you have a lab appointment with the Cancer Center - please note that after April 8th, 2024, all labs will be drawn in the cancer center.  You do not have to check in or register with the main entrance as you have in the past but will complete your check-in in the cancer center.  Wear comfortable clothing and clothing appropriate for easy access to any Portacath or PICC line.   We strive to give you quality time with your provider. You may need to reschedule your appointment if you arrive late (15 or more minutes).  Arriving late affects you and other patients whose appointments are after yours.  Also, if you miss three or more appointments without notifying the office, you may be dismissed from the clinic at the provider's discretion.      For prescription refill requests, have your pharmacy contact our office and allow 72 hours for refills to be completed.    Today you received the following chemotherapy and/or immunotherapy agents Compazine, Velcade and 10 units of insulin for blood sugar of 220.    To help prevent nausea and vomiting after your treatment, we encourage you to take your nausea medication as directed.  BELOW ARE SYMPTOMS THAT SHOULD BE REPORTED IMMEDIATELY: *FEVER GREATER THAN 100.4 F (38 C) OR HIGHER *CHILLS OR SWEATING *NAUSEA AND VOMITING THAT IS NOT CONTROLLED WITH YOUR NAUSEA MEDICATION *UNUSUAL SHORTNESS OF BREATH *UNUSUAL BRUISING OR BLEEDING *URINARY PROBLEMS (pain or burning when urinating, or frequent urination) *BOWEL PROBLEMS (unusual diarrhea, constipation, pain near the anus) TENDERNESS IN MOUTH AND THROAT WITH OR WITHOUT PRESENCE OF ULCERS (sore throat, sores in mouth, or a toothache) UNUSUAL RASH, SWELLING OR PAIN  UNUSUAL VAGINAL DISCHARGE OR ITCHING   Items with *  indicate a potential emergency and should be followed up as soon as possible or go to the Emergency Department if any problems should occur.  Please show the CHEMOTHERAPY ALERT CARD or IMMUNOTHERAPY ALERT CARD at check-in to the Emergency Department and triage nurse.  Should you have questions after your visit or need to cancel or reschedule your appointment, please contact [facility] [place] - A DEPT OF Eligha Bridegroom [facility] [phone]  and follow the prompts.  Office hours are 8:00 a.m. to 4:30 p.m. Monday - Friday. Please note that voicemails left after 4:00 p.m. may not be returned until the following business day.  We are closed weekends and major holidays. You have access to a nurse at all times for urgent questions. Please call the main number to the clinic [phone] and follow the prompts.  For any non-urgent questions, you may also contact your provider using MyChart. We now offer e-Visits for anyone 41 and older to request care online for non-urgent symptoms. For details visit [email or web address].   Also download the MyChart app! Go to the app store, search "MyChart", open the app, select [place], and log in with your MyChart username and password.

## 2023-06-01 NOTE — Assessment & Plan Note (Signed)
 There is no significant fusiform enlargement or associated effusion.  Is nontender to palpation.

## 2023-06-01 NOTE — Progress Notes (Signed)
 Patient presents today for Velcade, labs within treatment parameters. Blood sugar 220, 10 units of insulin ordered per parameters. Patient tolerated Velcade and insulin injection with no complaints voiced. Lab work reviewed. See MAR for details. Injection site clean and dry with no bruising or swelling noted. Patient stable during and after injection. Band aid applied. VSS. AVS printed out and sent with patient. Patient left in satisfactory condition with no s/s of distress noted.

## 2023-06-01 NOTE — Assessment & Plan Note (Signed)
 Currently echo reveals that the aortic stenosis is mild and should not have resulted in the syncope.

## 2023-06-01 NOTE — Assessment & Plan Note (Signed)
 Today's blood pressure is an outlier; there is marked variability in the blood pressure recordings with systolics as low as 109.  With a history of syncope at hemodialysis;'s clinical to avoid soft blood pressures.  Blood pressure will be monitored and average prior to dialysis and on dialysis days verified as possible to guide any antihypertensive therapy.

## 2023-06-01 NOTE — Progress Notes (Unsigned)
 NURSING HOME LOCATION:  [facility] ROOM NUMBER:  [id number]  CODE STATUS:  DNR  PCP: Sharee Holster, NP   This is a nursing facility follow up visit for Nursing Facility readmission within 30 days.  Interim medical record and care since last SNF visit was updated with review of diagnostic studies and change in clinical status since last visit were documented.  HPI: She was rehospitalized 3/17 - 05/27/2023 sent to the ED from [facility] after an apparent syncopal episode.  She struck her right knee and was complaining of right knee pain since the fall.  In the context of advanced dementia; the patient can provide no other information. The images revealed no fracture.  EKG revealed prolonged QT.  Clinically there was suspicion for syncope related to severe aortic stenosis.  Etiology of the knee pain was unclear; uric acid was 3.4 and C-reactive protein 3.1.  Sed rate was significantly elevated at 79.  She was given an empiric course of oral prednisone. Troponin was elevated and peaked at 4099.  Cardiology consulted; did not feel that she is a candidate for advanced therapy.  Echo revealed EF of 55-60%, and only mild AAS.  She did receive IV heparin for 48 hours.  She was to continue aspirin and statin.  Creatinine was 11.95 and GFR 3. While hospitalized she received hemodialysis 3/19. Her normochromic, normocytic anemia was essentially stable with H/H ranging from 10.4/32.8 up to 11.7/35.1.  She did have persistent macrocytosis with a peak MCV 8502.2.   Review of systems: Dementia invalidated responses.  Although she had been out of facility this afternoon for infusion at the oncologist office; she denied having been anywhere.  She states that she did "pass out" after hemodialysis was initiated and hit her knee.  She stated that it was a "hard bump but much better now."  It is better when supine especially.  When I ask about any neuro or cardiac prodrome she denied such; but she  stated "no my eyes were closed."   Constitutional: No fever, significant weight change, fatigue  Eyes: No redness, discharge, pain, vision change ENT/mouth: No nasal congestion,  purulent discharge, earache, change in hearing, sore throat  Cardiovascular: No chest pain, palpitations, paroxysmal nocturnal dyspnea, claudication, edema  Respiratory: No cough, sputum production, hemoptysis, DOE, significant snoring, apnea   Gastrointestinal: No heartburn, dysphagia, abdominal pain, nausea /vomiting, rectal bleeding, melena, change in bowels Genitourinary: No dysuria, hematuria, pyuria, incontinence, nocturia Musculoskeletal: No joint stiffness, joint swelling, weakness, pain Dermatologic: No rash, pruritus, change in appearance of skin Neurologic: No dizziness, headache, syncope, seizures, numbness, tingling Psychiatric: No significant anxiety, depression, insomnia, anorexia Endocrine: No change in hair/skin/nails, excessive thirst, excessive hunger, excessive urination  Hematologic/lymphatic: No significant bruising, lymphadenopathy, abnormal bleeding Allergy/immunology: No itchy/watery eyes, significant sneezing, urticaria, angioedema  Physical exam:  Pertinent or positive findings: Affect is markedly flat.  Responses are typically a long drawnout "no."  Ptosis is present on the left.  The left nasolabial fold is decreased inferiorly.  She has a grade 1 systolic murmur.  She has low-grade rales at the bases.  Bronchovesicular breath sounds are heard superiorly.  Abdomen is protuberant.  Pedal pulses are not palpable.  She has visible edema of the feet which is nonpitting.  She has one half edema at the sock line. General appearance: Adequately nourished; no acute distress, increased work of breathing is present.   Lymphatic: No lymphadenopathy about the head, neck, axilla. Eyes: No conjunctival inflammation or lid  edema is present. There is no scleral icterus. Ears:  External ear exam shows no  significant lesions or deformities.   Nose:  External nasal examination shows no deformity or inflammation. Nasal mucosa are pink and moist without lesions, exudates Oral exam:  Lips and gums are healthy appearing. There is no oropharyngeal erythema or exudate. Neck:  No thyromegaly, masses, tenderness noted.    Heart:  Normal rate and regular rhythm. S1 and S2 normal without gallop, murmur, click, rub .  Lungs: Chest clear to auscultation without wheezes, rhonchi, rales, rubs. Abdomen: Bowel sounds are normal. Abdomen is soft and nontender with no organomegaly, hernias, masses. GU: Deferred  Extremities:  No cyanosis, clubbing, edema  Neurologic exam : Cn 2-7 intact Strength equal  in upper & lower extremities Balance, Rhomberg, finger to nose testing could not be completed due to clinical state Deep tendon reflexes are equal Skin: Warm & dry w/o tenting. No significant lesions or rash.  See summary under each active problem in the Problem List with associated updated therapeutic plan

## 2023-06-01 NOTE — Patient Instructions (Signed)
 See assessment and plan under each diagnosis in the problem list and acutely for this visit

## 2023-06-02 DIAGNOSIS — Z9181 History of falling: Secondary | ICD-10-CM | POA: Diagnosis not present

## 2023-06-02 DIAGNOSIS — M6281 Muscle weakness (generalized): Secondary | ICD-10-CM | POA: Diagnosis not present

## 2023-06-02 DIAGNOSIS — R2681 Unsteadiness on feet: Secondary | ICD-10-CM | POA: Diagnosis not present

## 2023-06-02 DIAGNOSIS — R279 Unspecified lack of coordination: Secondary | ICD-10-CM | POA: Diagnosis not present

## 2023-06-02 DIAGNOSIS — A419 Sepsis, unspecified organism: Secondary | ICD-10-CM | POA: Diagnosis not present

## 2023-06-02 DIAGNOSIS — I214 Non-ST elevation (NSTEMI) myocardial infarction: Secondary | ICD-10-CM | POA: Diagnosis not present

## 2023-06-03 DIAGNOSIS — A419 Sepsis, unspecified organism: Secondary | ICD-10-CM | POA: Diagnosis not present

## 2023-06-03 DIAGNOSIS — I214 Non-ST elevation (NSTEMI) myocardial infarction: Secondary | ICD-10-CM | POA: Diagnosis not present

## 2023-06-03 DIAGNOSIS — M6281 Muscle weakness (generalized): Secondary | ICD-10-CM | POA: Diagnosis not present

## 2023-06-03 DIAGNOSIS — Z9181 History of falling: Secondary | ICD-10-CM | POA: Diagnosis not present

## 2023-06-03 DIAGNOSIS — R2681 Unsteadiness on feet: Secondary | ICD-10-CM | POA: Diagnosis not present

## 2023-06-03 DIAGNOSIS — R279 Unspecified lack of coordination: Secondary | ICD-10-CM | POA: Diagnosis not present

## 2023-06-04 DIAGNOSIS — R2681 Unsteadiness on feet: Secondary | ICD-10-CM | POA: Diagnosis not present

## 2023-06-04 DIAGNOSIS — Z9181 History of falling: Secondary | ICD-10-CM | POA: Diagnosis not present

## 2023-06-04 DIAGNOSIS — N186 End stage renal disease: Secondary | ICD-10-CM | POA: Diagnosis not present

## 2023-06-04 DIAGNOSIS — I214 Non-ST elevation (NSTEMI) myocardial infarction: Secondary | ICD-10-CM | POA: Diagnosis not present

## 2023-06-04 DIAGNOSIS — A419 Sepsis, unspecified organism: Secondary | ICD-10-CM | POA: Diagnosis not present

## 2023-06-04 DIAGNOSIS — Z992 Dependence on renal dialysis: Secondary | ICD-10-CM | POA: Diagnosis not present

## 2023-06-04 DIAGNOSIS — M6281 Muscle weakness (generalized): Secondary | ICD-10-CM | POA: Diagnosis not present

## 2023-06-04 DIAGNOSIS — R279 Unspecified lack of coordination: Secondary | ICD-10-CM | POA: Diagnosis not present

## 2023-06-05 DIAGNOSIS — R279 Unspecified lack of coordination: Secondary | ICD-10-CM | POA: Diagnosis not present

## 2023-06-05 DIAGNOSIS — I214 Non-ST elevation (NSTEMI) myocardial infarction: Secondary | ICD-10-CM | POA: Diagnosis not present

## 2023-06-05 DIAGNOSIS — Z9181 History of falling: Secondary | ICD-10-CM | POA: Diagnosis not present

## 2023-06-05 DIAGNOSIS — A419 Sepsis, unspecified organism: Secondary | ICD-10-CM | POA: Diagnosis not present

## 2023-06-05 DIAGNOSIS — R2681 Unsteadiness on feet: Secondary | ICD-10-CM | POA: Diagnosis not present

## 2023-06-05 DIAGNOSIS — N186 End stage renal disease: Secondary | ICD-10-CM | POA: Diagnosis not present

## 2023-06-05 DIAGNOSIS — M6281 Muscle weakness (generalized): Secondary | ICD-10-CM | POA: Diagnosis not present

## 2023-06-05 DIAGNOSIS — Z992 Dependence on renal dialysis: Secondary | ICD-10-CM | POA: Diagnosis not present

## 2023-06-06 DIAGNOSIS — Z9181 History of falling: Secondary | ICD-10-CM | POA: Diagnosis not present

## 2023-06-06 DIAGNOSIS — A419 Sepsis, unspecified organism: Secondary | ICD-10-CM | POA: Diagnosis not present

## 2023-06-06 DIAGNOSIS — M6281 Muscle weakness (generalized): Secondary | ICD-10-CM | POA: Diagnosis not present

## 2023-06-06 DIAGNOSIS — I214 Non-ST elevation (NSTEMI) myocardial infarction: Secondary | ICD-10-CM | POA: Diagnosis not present

## 2023-06-06 DIAGNOSIS — R279 Unspecified lack of coordination: Secondary | ICD-10-CM | POA: Diagnosis not present

## 2023-06-06 DIAGNOSIS — R2681 Unsteadiness on feet: Secondary | ICD-10-CM | POA: Diagnosis not present

## 2023-06-07 ENCOUNTER — Other Ambulatory Visit: Payer: Self-pay

## 2023-06-07 DIAGNOSIS — R279 Unspecified lack of coordination: Secondary | ICD-10-CM | POA: Diagnosis not present

## 2023-06-07 DIAGNOSIS — Z992 Dependence on renal dialysis: Secondary | ICD-10-CM | POA: Diagnosis not present

## 2023-06-07 DIAGNOSIS — I214 Non-ST elevation (NSTEMI) myocardial infarction: Secondary | ICD-10-CM | POA: Diagnosis not present

## 2023-06-07 DIAGNOSIS — N186 End stage renal disease: Secondary | ICD-10-CM | POA: Diagnosis not present

## 2023-06-07 DIAGNOSIS — R2681 Unsteadiness on feet: Secondary | ICD-10-CM | POA: Diagnosis not present

## 2023-06-07 DIAGNOSIS — A419 Sepsis, unspecified organism: Secondary | ICD-10-CM | POA: Diagnosis not present

## 2023-06-07 DIAGNOSIS — M6281 Muscle weakness (generalized): Secondary | ICD-10-CM | POA: Diagnosis not present

## 2023-06-07 DIAGNOSIS — Z9181 History of falling: Secondary | ICD-10-CM | POA: Diagnosis not present

## 2023-06-07 DIAGNOSIS — C9000 Multiple myeloma not having achieved remission: Secondary | ICD-10-CM

## 2023-06-08 ENCOUNTER — Encounter: Payer: Self-pay | Admitting: Hematology

## 2023-06-08 ENCOUNTER — Inpatient Hospital Stay: Payer: Medicare PPO | Attending: Hematology

## 2023-06-08 ENCOUNTER — Inpatient Hospital Stay: Payer: Medicare PPO

## 2023-06-08 VITALS — BP 148/81 | HR 90 | Temp 96.4°F | Resp 18 | Wt 142.6 lb

## 2023-06-08 DIAGNOSIS — Z1732 Human epidermal growth factor receptor 2 negative status: Secondary | ICD-10-CM | POA: Diagnosis not present

## 2023-06-08 DIAGNOSIS — R279 Unspecified lack of coordination: Secondary | ICD-10-CM | POA: Diagnosis not present

## 2023-06-08 DIAGNOSIS — Z7982 Long term (current) use of aspirin: Secondary | ICD-10-CM | POA: Diagnosis not present

## 2023-06-08 DIAGNOSIS — A419 Sepsis, unspecified organism: Secondary | ICD-10-CM | POA: Diagnosis not present

## 2023-06-08 DIAGNOSIS — Z7952 Long term (current) use of systemic steroids: Secondary | ICD-10-CM | POA: Diagnosis not present

## 2023-06-08 DIAGNOSIS — Z9011 Acquired absence of right breast and nipple: Secondary | ICD-10-CM | POA: Diagnosis not present

## 2023-06-08 DIAGNOSIS — Z9181 History of falling: Secondary | ICD-10-CM | POA: Diagnosis not present

## 2023-06-08 DIAGNOSIS — Z170 Estrogen receptor positive status [ER+]: Secondary | ICD-10-CM | POA: Insufficient documentation

## 2023-06-08 DIAGNOSIS — Z79624 Long term (current) use of inhibitors of nucleotide synthesis: Secondary | ICD-10-CM | POA: Insufficient documentation

## 2023-06-08 DIAGNOSIS — C9000 Multiple myeloma not having achieved remission: Secondary | ICD-10-CM | POA: Diagnosis not present

## 2023-06-08 DIAGNOSIS — Z1721 Progesterone receptor positive status: Secondary | ICD-10-CM | POA: Insufficient documentation

## 2023-06-08 DIAGNOSIS — R2681 Unsteadiness on feet: Secondary | ICD-10-CM | POA: Diagnosis not present

## 2023-06-08 DIAGNOSIS — N186 End stage renal disease: Secondary | ICD-10-CM | POA: Insufficient documentation

## 2023-06-08 DIAGNOSIS — Z5112 Encounter for antineoplastic immunotherapy: Secondary | ICD-10-CM | POA: Insufficient documentation

## 2023-06-08 DIAGNOSIS — I120 Hypertensive chronic kidney disease with stage 5 chronic kidney disease or end stage renal disease: Secondary | ICD-10-CM | POA: Insufficient documentation

## 2023-06-08 DIAGNOSIS — Z79811 Long term (current) use of aromatase inhibitors: Secondary | ICD-10-CM | POA: Diagnosis not present

## 2023-06-08 DIAGNOSIS — Z7969 Long term (current) use of other immunomodulators and immunosuppressants: Secondary | ICD-10-CM | POA: Diagnosis not present

## 2023-06-08 DIAGNOSIS — C50411 Malignant neoplasm of upper-outer quadrant of right female breast: Secondary | ICD-10-CM | POA: Diagnosis not present

## 2023-06-08 DIAGNOSIS — Z992 Dependence on renal dialysis: Secondary | ICD-10-CM | POA: Insufficient documentation

## 2023-06-08 DIAGNOSIS — M6281 Muscle weakness (generalized): Secondary | ICD-10-CM | POA: Diagnosis not present

## 2023-06-08 DIAGNOSIS — E1122 Type 2 diabetes mellitus with diabetic chronic kidney disease: Secondary | ICD-10-CM | POA: Diagnosis not present

## 2023-06-08 DIAGNOSIS — Z79899 Other long term (current) drug therapy: Secondary | ICD-10-CM | POA: Insufficient documentation

## 2023-06-08 DIAGNOSIS — I214 Non-ST elevation (NSTEMI) myocardial infarction: Secondary | ICD-10-CM | POA: Diagnosis not present

## 2023-06-08 LAB — COMPREHENSIVE METABOLIC PANEL WITH GFR
ALT: 12 U/L (ref 0–44)
AST: 19 U/L (ref 15–41)
Albumin: 3.6 g/dL (ref 3.5–5.0)
Alkaline Phosphatase: 86 U/L (ref 38–126)
Anion gap: 14 (ref 5–15)
BUN: 24 mg/dL — ABNORMAL HIGH (ref 8–23)
CO2: 27 mmol/L (ref 22–32)
Calcium: 9.3 mg/dL (ref 8.9–10.3)
Chloride: 94 mmol/L — ABNORMAL LOW (ref 98–111)
Creatinine, Ser: 6.42 mg/dL — ABNORMAL HIGH (ref 0.44–1.00)
GFR, Estimated: 6 mL/min — ABNORMAL LOW (ref >60)
Glucose, Bld: 172 mg/dL — ABNORMAL HIGH (ref 70–99)
Potassium: 4 mmol/L (ref 3.5–5.1)
Sodium: 135 mmol/L (ref 135–145)
Total Bilirubin: 0.5 mg/dL (ref 0.0–1.2)
Total Protein: 7.1 g/dL (ref 6.5–8.1)

## 2023-06-08 LAB — CBC WITH DIFFERENTIAL/PLATELET
Abs Immature Granulocytes: 0.01 10*3/uL (ref 0.00–0.07)
Basophils Absolute: 0 10*3/uL (ref 0.0–0.1)
Basophils Relative: 1 %
Eosinophils Absolute: 0.3 10*3/uL (ref 0.0–0.5)
Eosinophils Relative: 5 %
HCT: 33.7 % — ABNORMAL LOW (ref 36.0–46.0)
Hemoglobin: 10.7 g/dL — ABNORMAL LOW (ref 12.0–15.0)
Immature Granulocytes: 0 %
Lymphocytes Relative: 13 %
Lymphs Abs: 0.7 10*3/uL (ref 0.7–4.0)
MCH: 32.3 pg (ref 26.0–34.0)
MCHC: 31.8 g/dL (ref 30.0–36.0)
MCV: 101.8 fL — ABNORMAL HIGH (ref 80.0–100.0)
Monocytes Absolute: 0.5 10*3/uL (ref 0.1–1.0)
Monocytes Relative: 10 %
Neutro Abs: 3.8 10*3/uL (ref 1.7–7.7)
Neutrophils Relative %: 71 %
Platelets: 231 10*3/uL (ref 150–400)
RBC: 3.31 MIL/uL — ABNORMAL LOW (ref 3.87–5.11)
RDW: 16 % — ABNORMAL HIGH (ref 11.5–15.5)
WBC: 5.3 10*3/uL (ref 4.0–10.5)
nRBC: 0 % (ref 0.0–0.2)

## 2023-06-08 LAB — MAGNESIUM: Magnesium: 2.3 mg/dL (ref 1.7–2.4)

## 2023-06-08 MED ORDER — PROCHLORPERAZINE MALEATE 10 MG PO TABS
10.0000 mg | ORAL_TABLET | Freq: Once | ORAL | Status: AC
  Administered 2023-06-08: 10 mg via ORAL
  Filled 2023-06-08: qty 1

## 2023-06-08 MED ORDER — BORTEZOMIB CHEMO SQ INJECTION 3.5 MG (2.5MG/ML)
1.2000 mg/m2 | Freq: Once | INTRAMUSCULAR | Status: AC
  Administered 2023-06-08: 2 mg via SUBCUTANEOUS
  Filled 2023-06-08: qty 0.8

## 2023-06-08 NOTE — Patient Instructions (Signed)
 [facility] [place] [facility]  Discharge Instructions: Thank you for choosing [facility] to provide your oncology and hematology care.  If you have a lab appointment with the Cancer Center - please note that after April 8th, 2024, all labs will be drawn in the cancer center.  You do not have to check in or register with the main entrance as you have in the past but will complete your check-in in the cancer center.  Wear comfortable clothing and clothing appropriate for easy access to any Portacath or PICC line.   We strive to give you quality time with your provider. You may need to reschedule your appointment if you arrive late (15 or more minutes).  Arriving late affects you and other patients whose appointments are after yours.  Also, if you miss three or more appointments without notifying the office, you may be dismissed from the clinic at the provider's discretion.      For prescription refill requests, have your pharmacy contact our office and allow 72 hours for refills to be completed.    Today you received the following chemotherapy and/or immunotherapy agents Velcade.  Bortezomib Injection What is this medication? BORTEZOMIB (bor TEZ oh mib) treats lymphoma. It may also be used to treat multiple myeloma, a type of bone marrow cancer. It works by blocking a protein that causes cancer cells to grow and multiply. This helps to slow or stop the spread of cancer cells. This medicine may be used for other purposes; ask your health care provider or pharmacist if you have questions. COMMON BRAND NAME(S): BORUZU, Velcade What should I tell my care team before I take this medication? They need to know if you have any of these conditions: Dehydration Diabetes Heart disease Liver disease Tingling of the fingers or toes or other nerve disorder An unusual or allergic reaction to bortezomib, other medications, foods, dyes, or preservatives If  you or your partner are pregnant or trying to get pregnant Breastfeeding How should I use this medication? This medication is injected into a vein or under the skin. It is given by your care team in a hospital or clinic setting. Talk to your care team about the use of this medication in children. Special care may be needed. Overdosage: If you think you have taken too much of this medicine contact a poison control center or emergency room at once. NOTE: This medicine is only for you. Do not share this medicine with others. What if I miss a dose? Keep appointments for follow-up doses. It is important not to miss your dose. Call your care team if you are unable to keep an appointment. What may interact with this medication? Ketoconazole Rifampin This list may not describe all possible interactions. Give your health care provider a list of all the medicines, herbs, non-prescription drugs, or dietary supplements you use. Also tell them if you smoke, drink alcohol, or use illegal drugs. Some items may interact with your medicine. What should I watch for while using this medication? Your condition will be monitored carefully while you are receiving this medication. You may need blood work while taking this medication. This medication may affect your coordination, reaction time, or judgment. Do not drive or operate machinery until you know how this medication affects you. Sit up or stand slowly to reduce the risk of dizzy or fainting spells. Drinking alcohol with this medication can increase the risk of these side effects.  This medication may increase your risk of getting an infection. Call your care team for advice if you get a fever, chills, sore throat, or other symptoms of a cold or flu. Do not treat yourself. Try to avoid being around people who are sick. Check with your care team if you have severe diarrhea, nausea, and vomiting, or if you sweat a lot. The loss of too much body fluid may make it  dangerous for you to take this medication. Talk to your care team if you may be pregnant. Serious birth defects can occur if you take this medication during pregnancy and for 7 months after the last dose. You will need a negative pregnancy test before starting this medication. Contraception is recommended while taking this medication and for 7 months after the last dose. Your care team can help you find the option that works for you. If your partner can get pregnant, use a condom during sex while taking this medication and for 4 months after the last dose. Do not breastfeed while taking this medication and for 2 months after the last dose. This medication may cause infertility. Talk to your care team if you are concerned about your fertility. What side effects may I notice from receiving this medication? Side effects that you should report to your care team as soon as possible: Allergic reactions--skin rash, itching, hives, swelling of the face, lips, tongue, or throat Bleeding--bloody or black, tar-like stools, vomiting blood or [name] material that looks like coffee grounds, red or dark [name] urine, small red or purple spots on skin, unusual bruising or bleeding Bleeding in the brain--severe headache, stiff neck, confusion, dizziness, change in vision, numbness or weakness of the face, arm, or leg, trouble speaking, trouble walking, vomiting Bowel blockage--stomach cramping, unable to have a bowel movement or pass gas, loss of appetite, vomiting Heart failure--shortness of breath, swelling of the ankles, feet, or hands, sudden weight gain, unusual weakness or fatigue Infection--fever, chills, cough, sore throat, wounds that don't heal, pain or trouble when passing urine, general feeling of discomfort or being unwell Liver injury--right upper belly pain, loss of appetite, nausea, light-colored stool, dark yellow or [name] urine, yellowing skin or eyes, unusual weakness or fatigue Low blood  pressure--dizziness, feeling faint or lightheaded, blurry vision Lung injury--shortness of breath or trouble breathing, cough, spitting up blood, chest pain, fever Pain, tingling, or numbness in the hands or feet Severe or prolonged diarrhea Stomach pain, bloody diarrhea, pale skin, unusual weakness or fatigue, decrease in the amount of urine, which may be signs of hemolytic uremic syndrome Sudden and severe headache, confusion, change in vision, seizures, which may be signs of posterior reversible encephalopathy syndrome (PRES) TTP--purple spots on the skin or inside the mouth, pale skin, yellowing skin or eyes, unusual weakness or fatigue, fever, fast or irregular heartbeat, confusion, change in vision, trouble speaking, trouble walking Tumor lysis syndrome (TLS)--nausea, vomiting, diarrhea, decrease in the amount of urine, dark urine, unusual weakness or fatigue, confusion, muscle pain or cramps, fast or irregular heartbeat, joint pain Side effects that usually do not require medical attention (report to your care team if they continue or are bothersome): Constipation Diarrhea Fatigue Loss of appetite Nausea This list may not describe all possible side effects. Call your doctor for medical advice about side effects. You may report side effects to FDA at 1-800-FDA-1088. Where should I keep my medication? This medication is given in a hospital or clinic. It will not be stored at home. NOTE: This sheet  is a summary. It may not cover all possible information. If you have questions about this medicine, talk to your doctor, pharmacist, or health care provider.  2024 Elsevier/Gold Standard (2021-07-29 00:00:00)       To help prevent nausea and vomiting after your treatment, we encourage you to take your nausea medication as directed.  BELOW ARE SYMPTOMS THAT SHOULD BE REPORTED IMMEDIATELY: *FEVER GREATER THAN 100.4 F (38 C) OR HIGHER *CHILLS OR SWEATING *NAUSEA AND VOMITING THAT IS NOT  CONTROLLED WITH YOUR NAUSEA MEDICATION *UNUSUAL SHORTNESS OF BREATH *UNUSUAL BRUISING OR BLEEDING *URINARY PROBLEMS (pain or burning when urinating, or frequent urination) *BOWEL PROBLEMS (unusual diarrhea, constipation, pain near the anus) TENDERNESS IN MOUTH AND THROAT WITH OR WITHOUT PRESENCE OF ULCERS (sore throat, sores in mouth, or a toothache) UNUSUAL RASH, SWELLING OR PAIN  UNUSUAL VAGINAL DISCHARGE OR ITCHING   Items with * indicate a potential emergency and should be followed up as soon as possible or go to the Emergency Department if any problems should occur.  Please show the CHEMOTHERAPY ALERT CARD or IMMUNOTHERAPY ALERT CARD at check-in to the Emergency Department and triage nurse.  Should you have questions after your visit or need to cancel or reschedule your appointment, please contact [facility]ER CTR  - A DEPT OF Eligha Bridegroom [facility] [phone]  and follow the prompts.  Office hours are 8:00 a.m. to 4:30 p.m. Monday - Friday. Please note that voicemails left after 4:00 p.m. may not be returned until the following business day.  We are closed weekends and major holidays. You have access to a nurse at all times for urgent questions. Please call the main number to the clinic [phone] and follow the prompts.  For any non-urgent questions, you may also contact your provider using MyChart. We now offer e-Visits for anyone 29 and older to request care online for non-urgent symptoms. For details visit [email or web address].   Also download the MyChart app! Go to the app store, search "MyChart", open the app, select [place], and log in with your MyChart username and password.

## 2023-06-08 NOTE — Progress Notes (Signed)
 Patient presents today for Velcade injection.  Patient is in satisfactory condition with no new complaints voiced.  Vital signs are stable.  Labs reviewed and all labs are within treatment parameters.  We will proceed with injection per MD orders.    Patient tolerated injection with no complaints voiced.  Site clean and dry with no bruising or swelling noted.  No complaints of pain.  Discharged with vital signs stable and no signs or symptoms of distress noted.

## 2023-06-09 DIAGNOSIS — Z992 Dependence on renal dialysis: Secondary | ICD-10-CM | POA: Diagnosis not present

## 2023-06-09 DIAGNOSIS — R279 Unspecified lack of coordination: Secondary | ICD-10-CM | POA: Diagnosis not present

## 2023-06-09 DIAGNOSIS — I214 Non-ST elevation (NSTEMI) myocardial infarction: Secondary | ICD-10-CM | POA: Diagnosis not present

## 2023-06-09 DIAGNOSIS — R2681 Unsteadiness on feet: Secondary | ICD-10-CM | POA: Diagnosis not present

## 2023-06-09 DIAGNOSIS — A419 Sepsis, unspecified organism: Secondary | ICD-10-CM | POA: Diagnosis not present

## 2023-06-09 DIAGNOSIS — M6281 Muscle weakness (generalized): Secondary | ICD-10-CM | POA: Diagnosis not present

## 2023-06-09 DIAGNOSIS — Z9181 History of falling: Secondary | ICD-10-CM | POA: Diagnosis not present

## 2023-06-09 DIAGNOSIS — N186 End stage renal disease: Secondary | ICD-10-CM | POA: Diagnosis not present

## 2023-06-10 DIAGNOSIS — M6281 Muscle weakness (generalized): Secondary | ICD-10-CM | POA: Diagnosis not present

## 2023-06-10 DIAGNOSIS — I214 Non-ST elevation (NSTEMI) myocardial infarction: Secondary | ICD-10-CM | POA: Diagnosis not present

## 2023-06-10 DIAGNOSIS — Z9181 History of falling: Secondary | ICD-10-CM | POA: Diagnosis not present

## 2023-06-10 DIAGNOSIS — R2681 Unsteadiness on feet: Secondary | ICD-10-CM | POA: Diagnosis not present

## 2023-06-10 DIAGNOSIS — R279 Unspecified lack of coordination: Secondary | ICD-10-CM | POA: Diagnosis not present

## 2023-06-10 DIAGNOSIS — A419 Sepsis, unspecified organism: Secondary | ICD-10-CM | POA: Diagnosis not present

## 2023-06-11 DIAGNOSIS — M6281 Muscle weakness (generalized): Secondary | ICD-10-CM | POA: Diagnosis not present

## 2023-06-11 DIAGNOSIS — Z992 Dependence on renal dialysis: Secondary | ICD-10-CM | POA: Diagnosis not present

## 2023-06-11 DIAGNOSIS — Z9181 History of falling: Secondary | ICD-10-CM | POA: Diagnosis not present

## 2023-06-11 DIAGNOSIS — R279 Unspecified lack of coordination: Secondary | ICD-10-CM | POA: Diagnosis not present

## 2023-06-11 DIAGNOSIS — N186 End stage renal disease: Secondary | ICD-10-CM | POA: Diagnosis not present

## 2023-06-11 DIAGNOSIS — A419 Sepsis, unspecified organism: Secondary | ICD-10-CM | POA: Diagnosis not present

## 2023-06-11 DIAGNOSIS — R2681 Unsteadiness on feet: Secondary | ICD-10-CM | POA: Diagnosis not present

## 2023-06-11 DIAGNOSIS — I214 Non-ST elevation (NSTEMI) myocardial infarction: Secondary | ICD-10-CM | POA: Diagnosis not present

## 2023-06-14 DIAGNOSIS — Z9181 History of falling: Secondary | ICD-10-CM | POA: Diagnosis not present

## 2023-06-14 DIAGNOSIS — I214 Non-ST elevation (NSTEMI) myocardial infarction: Secondary | ICD-10-CM | POA: Diagnosis not present

## 2023-06-14 DIAGNOSIS — N186 End stage renal disease: Secondary | ICD-10-CM | POA: Diagnosis not present

## 2023-06-14 DIAGNOSIS — A419 Sepsis, unspecified organism: Secondary | ICD-10-CM | POA: Diagnosis not present

## 2023-06-14 DIAGNOSIS — R279 Unspecified lack of coordination: Secondary | ICD-10-CM | POA: Diagnosis not present

## 2023-06-14 DIAGNOSIS — Z992 Dependence on renal dialysis: Secondary | ICD-10-CM | POA: Diagnosis not present

## 2023-06-14 DIAGNOSIS — R2681 Unsteadiness on feet: Secondary | ICD-10-CM | POA: Diagnosis not present

## 2023-06-14 DIAGNOSIS — M6281 Muscle weakness (generalized): Secondary | ICD-10-CM | POA: Diagnosis not present

## 2023-06-15 ENCOUNTER — Inpatient Hospital Stay: Payer: Medicare PPO

## 2023-06-15 ENCOUNTER — Inpatient Hospital Stay (HOSPITAL_BASED_OUTPATIENT_CLINIC_OR_DEPARTMENT_OTHER): Payer: Medicare PPO | Admitting: Hematology

## 2023-06-15 VITALS — BP 154/80 | HR 79 | Temp 97.5°F | Resp 18 | Wt 142.0 lb

## 2023-06-15 DIAGNOSIS — Z170 Estrogen receptor positive status [ER+]: Secondary | ICD-10-CM | POA: Diagnosis not present

## 2023-06-15 DIAGNOSIS — M6281 Muscle weakness (generalized): Secondary | ICD-10-CM | POA: Diagnosis not present

## 2023-06-15 DIAGNOSIS — I214 Non-ST elevation (NSTEMI) myocardial infarction: Secondary | ICD-10-CM | POA: Diagnosis not present

## 2023-06-15 DIAGNOSIS — R279 Unspecified lack of coordination: Secondary | ICD-10-CM | POA: Diagnosis not present

## 2023-06-15 DIAGNOSIS — C9000 Multiple myeloma not having achieved remission: Secondary | ICD-10-CM

## 2023-06-15 DIAGNOSIS — Z79811 Long term (current) use of aromatase inhibitors: Secondary | ICD-10-CM | POA: Diagnosis not present

## 2023-06-15 DIAGNOSIS — Z9181 History of falling: Secondary | ICD-10-CM | POA: Diagnosis not present

## 2023-06-15 DIAGNOSIS — Z5112 Encounter for antineoplastic immunotherapy: Secondary | ICD-10-CM | POA: Diagnosis not present

## 2023-06-15 DIAGNOSIS — Z7952 Long term (current) use of systemic steroids: Secondary | ICD-10-CM | POA: Diagnosis not present

## 2023-06-15 DIAGNOSIS — C50411 Malignant neoplasm of upper-outer quadrant of right female breast: Secondary | ICD-10-CM | POA: Diagnosis not present

## 2023-06-15 DIAGNOSIS — R2681 Unsteadiness on feet: Secondary | ICD-10-CM | POA: Diagnosis not present

## 2023-06-15 DIAGNOSIS — Z1732 Human epidermal growth factor receptor 2 negative status: Secondary | ICD-10-CM | POA: Diagnosis not present

## 2023-06-15 DIAGNOSIS — Z1721 Progesterone receptor positive status: Secondary | ICD-10-CM | POA: Diagnosis not present

## 2023-06-15 DIAGNOSIS — A419 Sepsis, unspecified organism: Secondary | ICD-10-CM | POA: Diagnosis not present

## 2023-06-15 DIAGNOSIS — Z79624 Long term (current) use of inhibitors of nucleotide synthesis: Secondary | ICD-10-CM | POA: Diagnosis not present

## 2023-06-15 LAB — COMPREHENSIVE METABOLIC PANEL WITH GFR
ALT: 13 U/L (ref 0–44)
AST: 20 U/L (ref 15–41)
Albumin: 3.5 g/dL (ref 3.5–5.0)
Alkaline Phosphatase: 87 U/L (ref 38–126)
Anion gap: 14 (ref 5–15)
BUN: 24 mg/dL — ABNORMAL HIGH (ref 8–23)
CO2: 25 mmol/L (ref 22–32)
Calcium: 9.4 mg/dL (ref 8.9–10.3)
Chloride: 99 mmol/L (ref 98–111)
Creatinine, Ser: 6.2 mg/dL — ABNORMAL HIGH (ref 0.44–1.00)
GFR, Estimated: 6 mL/min — ABNORMAL LOW (ref >60)
Glucose, Bld: 122 mg/dL — ABNORMAL HIGH (ref 70–99)
Potassium: 4.2 mmol/L (ref 3.5–5.1)
Sodium: 138 mmol/L (ref 135–145)
Total Bilirubin: 0.4 mg/dL (ref 0.0–1.2)
Total Protein: 7.4 g/dL (ref 6.5–8.1)

## 2023-06-15 LAB — CBC WITH DIFFERENTIAL/PLATELET
Abs Immature Granulocytes: 0.01 10*3/uL (ref 0.00–0.07)
Basophils Absolute: 0 10*3/uL (ref 0.0–0.1)
Basophils Relative: 1 %
Eosinophils Absolute: 0.2 10*3/uL (ref 0.0–0.5)
Eosinophils Relative: 6 %
HCT: 35.8 % — ABNORMAL LOW (ref 36.0–46.0)
Hemoglobin: 10.9 g/dL — ABNORMAL LOW (ref 12.0–15.0)
Immature Granulocytes: 0 %
Lymphocytes Relative: 21 %
Lymphs Abs: 0.9 10*3/uL (ref 0.7–4.0)
MCH: 31.2 pg (ref 26.0–34.0)
MCHC: 30.4 g/dL (ref 30.0–36.0)
MCV: 102.6 fL — ABNORMAL HIGH (ref 80.0–100.0)
Monocytes Absolute: 0.5 10*3/uL (ref 0.1–1.0)
Monocytes Relative: 11 %
Neutro Abs: 2.6 10*3/uL (ref 1.7–7.7)
Neutrophils Relative %: 61 %
Platelets: 171 10*3/uL (ref 150–400)
RBC: 3.49 MIL/uL — ABNORMAL LOW (ref 3.87–5.11)
RDW: 15.9 % — ABNORMAL HIGH (ref 11.5–15.5)
WBC: 4.3 10*3/uL (ref 4.0–10.5)
nRBC: 0 % (ref 0.0–0.2)

## 2023-06-15 LAB — MAGNESIUM: Magnesium: 2.2 mg/dL (ref 1.7–2.4)

## 2023-06-15 MED ORDER — BORTEZOMIB CHEMO SQ INJECTION 3.5 MG (2.5MG/ML)
1.2000 mg/m2 | Freq: Once | INTRAMUSCULAR | Status: AC
  Administered 2023-06-15: 2 mg via SUBCUTANEOUS
  Filled 2023-06-15: qty 0.8

## 2023-06-15 MED ORDER — PROCHLORPERAZINE MALEATE 10 MG PO TABS
10.0000 mg | ORAL_TABLET | Freq: Once | ORAL | Status: AC
  Administered 2023-06-15: 10 mg via ORAL
  Filled 2023-06-15: qty 1

## 2023-06-15 NOTE — Patient Instructions (Signed)
 [facility] [place] [facility]  Discharge Instructions: Thank you for choosing [facility] to provide your oncology and hematology care.  If you have a lab appointment with the Cancer Center - please note that after April 8th, 2024, all labs will be drawn in the cancer center.  You do not have to check in or register with the main entrance as you have in the past but will complete your check-in in the cancer center.  Wear comfortable clothing and clothing appropriate for easy access to any Portacath or PICC line.   We strive to give you quality time with your provider. You may need to reschedule your appointment if you arrive late (15 or more minutes).  Arriving late affects you and other patients whose appointments are after yours.  Also, if you miss three or more appointments without notifying the office, you may be dismissed from the clinic at the provider's discretion.      For prescription refill requests, have your pharmacy contact our office and allow 72 hours for refills to be completed.    Today you received the following chemotherapy and/or immunotherapy agents velcade     To help prevent nausea and vomiting after your treatment, we encourage you to take your nausea medication as directed.  BELOW ARE SYMPTOMS THAT SHOULD BE REPORTED IMMEDIATELY: *FEVER GREATER THAN 100.4 F (38 C) OR HIGHER *CHILLS OR SWEATING *NAUSEA AND VOMITING THAT IS NOT CONTROLLED WITH YOUR NAUSEA MEDICATION *UNUSUAL SHORTNESS OF BREATH *UNUSUAL BRUISING OR BLEEDING *URINARY PROBLEMS (pain or burning when urinating, or frequent urination) *BOWEL PROBLEMS (unusual diarrhea, constipation, pain near the anus) TENDERNESS IN MOUTH AND THROAT WITH OR WITHOUT PRESENCE OF ULCERS (sore throat, sores in mouth, or a toothache) UNUSUAL RASH, SWELLING OR PAIN  UNUSUAL VAGINAL DISCHARGE OR ITCHING   Items with * indicate a potential emergency and should be followed up as  soon as possible or go to the Emergency Department if any problems should occur.  Please show the CHEMOTHERAPY ALERT CARD or IMMUNOTHERAPY ALERT CARD at check-in to the Emergency Department and triage nurse.  Should you have questions after your visit or need to cancel or reschedule your appointment, please contact [facility] CANCER CTR [place] - A DEPT OF Eligha Bridegroom [facility] [phone]  and follow the prompts.  Office hours are 8:00 a.m. to 4:30 p.m. Monday - Friday. Please note that voicemails left after 4:00 p.m. may not be returned until the following business day.  We are closed weekends and major holidays. You have access to a nurse at all times for urgent questions. Please call the main number to the clinic [phone] and follow the prompts.  For any non-urgent questions, you may also contact your provider using MyChart. We now offer e-Visits for anyone 62 and older to request care online for non-urgent symptoms. For details visit [email or web address].   Also download the MyChart app! Go to the app store, search "MyChart", open the app, select [place], and log in with your MyChart username and password.

## 2023-06-15 NOTE — Progress Notes (Signed)
 Patient tolerated Velcade injection with no complaints voiced.  Lab work reviewed.  See MAR for details.  Injection site clean and dry with no bruising or swelling noted.  Patient stable during and after injection.  Band aid applied.  VSS.  Patient left in satisfactory condition with no s/s of distress noted.

## 2023-06-15 NOTE — Patient Instructions (Signed)

## 2023-06-15 NOTE — Progress Notes (Signed)
 [facility] [address], [place] [address]    Clinic Day:  06/15/23   Referring physician: Sharee Holster, NP  Patient Care Team: Sharee Holster, NP as PCP - General (Geriatric Medicine) [name], Gerrit Friends, MD as Consulting Physician (Gastroenterology) [name], MD as Medical Oncologist (Medical Oncology) Salomon Mast, MD (Inactive) as Consulting Physician (Nephrology) [facility])   ASSESSMENT & PLAN:   Assessment: 1.  IgA lambda plasma cell myeloma, stage II, standard risk: -BMBX on 09/20/2018 shows plasma cell myeloma, 30% plasma cells.  FISH panel was normal.  Chromosome analysis normal. -Labs at diagnosis on 08/29/2018 with M spike 0.9 g.  Kappa light chains 148, lambda light chains 1132, ratio 0.13.  LDH normal.  Beta-2 microglobulin 13.5.  She had transfusion dependent anemia. -Velcade and dexamethasone started on 10/11/2018. -Myeloma panel on 07/11/2019 shows SPEP is negative.  Free light chain ratio improved to 1.09.  Lambda light chains are 128, improved from 141.  Kappa light chains are 140. -She has been resident at [facility] since 10/12/2019.   2.  Stage I IDC of the right breast, ER/PR positive, HER-2 negative: -She is on anastrozole.   Plan: 1.  IgA lambda plasma cell myeloma: - We have intensified Velcade to every week because of her worsening myeloma labs. - She was hospitalized from 05/24/2023 through 05/27/2023 with an NSTEMI, managed medically.  She did not experience any chest pains. - Velcade was started back on 06/01/2023 and her last dose was on 06/08/2023. - Reviewed myeloma labs from 05/18/2023: M spike was negative.  Previously 0.4 g.  Immunofixation shows IgA lambda.  Lambda light chains improved 525 from 599 with ratio at 0.34.  Kappa light chains 176. - She may continue Velcade weekly.  I will reassess her in 8 weeks with repeat myeloma labs 1 week prior.   2.  ESRD on HD: - She is  continuing hemodialysis on Monday, Wednesday and Friday.   3.  Peripheral neuropathy: - She has tingling/numbness in the fingertips which is stable.  No neuropathic pains reported.  Will closely monitor.   4.  Right breast cancer: - Continue anastrozole daily.  No side effects from it.  Orders Placed This Encounter  Procedures   Magnesium    Standing Status:   Future    Expected Date:   06/29/2023    Expiration Date:   06/28/2024   CBC with Differential    Standing Status:   Future    Expected Date:   06/29/2023    Expiration Date:   06/28/2024   Comprehensive metabolic panel    Standing Status:   Future    Expected Date:   06/29/2023    Expiration Date:   06/28/2024   Magnesium    Standing Status:   Future    Expected Date:   07/06/2023    Expiration Date:   07/05/2024   CBC with Differential    Standing Status:   Future    Expected Date:   07/06/2023    Expiration Date:   07/05/2024   Comprehensive metabolic panel    Standing Status:   Future    Expected Date:   07/06/2023    Expiration Date:   07/05/2024   Magnesium    Standing Status:   Future    Expected Date:   07/13/2023    Expiration Date:   07/12/2024   CBC with Differential    Standing Status:  Future    Expected Date:   07/13/2023    Expiration Date:   07/12/2024   Comprehensive metabolic panel    Standing Status:   Future    Expected Date:   07/13/2023    Expiration Date:   07/12/2024   Magnesium    Standing Status:   Future    Expected Date:   07/20/2023    Expiration Date:   07/19/2024   CBC with Differential    Standing Status:   Future    Expected Date:   07/20/2023    Expiration Date:   07/19/2024   Comprehensive metabolic panel    Standing Status:   Future    Expected Date:   07/20/2023    Expiration Date:   07/19/2024   Magnesium    Standing Status:   Future    Expected Date:   07/27/2023    Expiration Date:   07/26/2024   CBC with Differential    Standing Status:   Future    Expected Date:   07/27/2023     Expiration Date:   07/26/2024   Comprehensive metabolic panel    Standing Status:   Future    Expected Date:   07/27/2023    Expiration Date:   07/26/2024   Magnesium    Standing Status:   Future    Expected Date:   08/03/2023    Expiration Date:   08/02/2024   CBC with Differential    Standing Status:   Future    Expected Date:   08/03/2023    Expiration Date:   08/02/2024   Comprehensive metabolic panel    Standing Status:   Future    Expected Date:   08/03/2023    Expiration Date:   08/02/2024   Magnesium    Standing Status:   Future    Expected Date:   08/10/2023    Expiration Date:   08/09/2024   CBC with Differential    Standing Status:   Future    Expected Date:   08/10/2023    Expiration Date:   08/09/2024   Comprehensive metabolic panel    Standing Status:   Future    Expected Date:   08/10/2023    Expiration Date:   08/09/2024   Magnesium    Standing Status:   Future    Expected Date:   08/17/2023    Expiration Date:   08/16/2024   CBC with Differential    Standing Status:   Future    Expected Date:   08/17/2023    Expiration Date:   08/16/2024   Comprehensive metabolic panel    Standing Status:   Future    Expected Date:   08/17/2023    Expiration Date:   08/16/2024   Magnesium    Standing Status:   Future    Expected Date:   08/24/2023    Expiration Date:   08/23/2024   CBC with Differential    Standing Status:   Future    Expected Date:   08/24/2023    Expiration Date:   08/23/2024   Comprehensive metabolic panel    Standing Status:   Future    Expected Date:   08/24/2023    Expiration Date:   08/23/2024   Magnesium    Standing Status:   Future    Expected Date:   08/31/2023    Expiration Date:   08/30/2024   CBC with Differential    Standing Status:   Future  Expected Date:   08/31/2023    Expiration Date:   08/30/2024   Comprehensive metabolic panel    Standing Status:   Future    Expected Date:   08/31/2023    Expiration Date:   08/30/2024      Mikeal Hawthorne R Teague,acting  as a scribe for [name], MD.,have documented all relevant documentation on the behalf of [name], MD,as directed by  [name], MD while in the presence of [name], MD.  I, [name] MD, have reviewed the above documentation for accuracy and completeness, and I agree with the above.      [name], MD   4/8/20251:38 PM  CHIEF COMPLAINT:   Diagnosis: plasma cell myeloma and right breast cancer    Cancer Staging  Stage 1 infiltrating ductal carcinoma of right female breast [facility]) Staging form: Breast, AJCC 7th Edition - Clinical stage from 08/30/2015: Stage IA (T1c, N0, M0) - Signed by Ellouise Newer, PA-C on 08/30/2015    Prior Therapy: none  Current Therapy:  maintenance Velcade & Decadron 3/4 weeks; anastrozole   HISTORY OF PRESENT ILLNESS:   Oncology History  Stage 1 infiltrating ductal carcinoma of right female breast (HCC)  09/12/2014 Mammogram   Mass in upper outer R breast, middle third depth appears slightly larger than it was on prior exam with more irreg spiculated margins   10/02/2014 Pathology Results   biopsy with invasive ductal carcinoma high grade 1.1 cm, dcis solid type. additional R breast tissue, excision, invasive ductal high grade 0.9 cm    10/24/2014 Pathology Results   no residual invasive carcinoma, DCIS, focal, atypical ductal hyperplasia, 0/7 LN positive for metastatic carcinoma ER > 90%, PR 30%, HER 2 2+   10/24/2014 Cancer Staging   T1cN0M0   10/24/2014 Surgery   R mastectomy, T1c, N0   12/28/2014 - 11/22/2015 Chemotherapy   Taxol/herceptin weekly X 12, Herceptin every 21 days, last due in September 2017   03/11/2015 -  Anti-estrogen oral therapy   Arimidex 1 mg daily   09/12/2015 Imaging   MUGA- The left ventricular ejection fraction equals 68%.   06/08/2016 Treatment Plan Change   Started Nerlynx   Multiple myeloma (HCC)  10/04/2018 Initial Diagnosis   Multiple myeloma (HCC)    10/11/2018 - 10/28/2021 Chemotherapy   Patient is on Treatment Plan : MYELOMA NON-TRANSPLANT CANDIDATES VRd weekly d21d      11/25/2021 -  Chemotherapy   Patient is on Treatment Plan : MYELOMA MAINTENANCE Bortezomib SQ q7d        INTERVAL HISTORY:   Myan is a 88 y.o. female presenting to clinic today for follow up of plasma cell myeloma and breast cancer. She was last seen by me on 04/13/23.  Since her last visit, she was admitted to the hospital from 05/24/23 to 05/27/23 for NSTEMI, treated with 48 hours of IV heparin and transitioned to ASA 81 mg OD. Dessa reports she lost consciousness prior to hospitalization at hemodialysis that led her to go to the hospital, and denies any knowledge of NSTEMI at her hospital stay. She denies any chest pain at this visit.   Today, she states that she is doing well overall. Her appetite level is at 100%. Her energy level is at 100%.  She reports numbness in the bilateral fingers. She denies any pain to the area.   PAST MEDICAL HISTORY:   Past Medical History: Past Medical History:  Diagnosis Date   Anemia     chronic macrocytic anemia  Anxiety    Chronic kidney disease    Chronic renal disease, stage 4, severely decreased glomerular filtration rate (GFR) between 15-29 mL/min/1.73 square meter (HCC) 08/22/2015   Complication of anesthesia    delirious after Breast Surgery   Dementia (HCC)    mild   Depression    Diabetes mellitus with ESRD (end-stage renal disease) (HCC)    type II   Dysphagia    Dyspnea    with activity   GERD (gastroesophageal reflux disease)    Glaucoma    Hyperlipidemia    Hypertension    Multiple myeloma (HCC)    Pneumonia    Stage 1 infiltrating ductal carcinoma of right female breast (HCC) 08/21/2015   ER+ PR+ HER 2 neu + (3+) T1cN0     Surgical History: Past Surgical History:  Procedure Laterality Date   A/V FISTULAGRAM Right 07/05/2020   Procedure: A/V Fistulagram;  Surgeon: Sherren Kerns, MD;   [facility];  Service: Cardiovascular;  Laterality: Right;   AV FISTULA PLACEMENT Left 11/22/2017   Procedure: ARTERIOVENOUS (AV) FISTULA CREATION LEFT ARM;  Surgeon: Sherren Kerns, MD;  Location: [facility] OR;  Service: Vascular;  Laterality: Left;   AV FISTULA PLACEMENT Right 04/04/2020   Procedure: RIGHT ARM ARTERIOVENOUS FISTULA CREATION;  Surgeon: Larina Earthly, MD;  Location: [facility];  Service: Vascular;  Laterality: Right;   AV FISTULA PLACEMENT Right 08/20/2020   Procedure: ARTERIOVENOUS (AV) FISTULA LIGATION RIGHT ARM;  Surgeon: Sherren Kerns, MD;  [facility];  Service: Vascular;  Laterality: Right;   BIOPSY  08/07/2016   Procedure: BIOPSY;  Surgeon: Corbin Ade, MD;  Location: [facility];  Service: Endoscopy;;  gastric ulcer biopsy   COLONOSCOPY     ESOPHAGOGASTRODUODENOSCOPY N/A 08/07/2016   LA Grade A esophagitis s/p dilation, small hiatal hernia, multiple gastric ulcers and erosions, duodenal erosions s/p biopsy. Negative H.pylori    ESOPHAGOGASTRODUODENOSCOPY N/A 11/27/2016   normal esophagus, previously noted gastric ulcers completely healed, normal duodenum.    ESOPHAGOGASTRODUODENOSCOPY (EGD) WITH PROPOFOL N/A 07/23/2020   Procedure: ESOPHAGOGASTRODUODENOSCOPY (EGD) WITH PROPOFOL;  Surgeon: Malissa Hippo, MD;  [facility]: [facility];  Service: Endoscopy;  Laterality: N/A;   FISTULA SUPERFICIALIZATION Left 02/14/2018   Procedure: FISTULA SUPERFICIALIZATION LEFT ARM;  Surgeon: Chuck Hint, MD;  Location: [facility] OR;  Service: Vascular;  Laterality: Left;   FRACTURE SURGERY Right    ankle   HOT HEMOSTASIS  07/23/2020   Procedure: HOT HEMOSTASIS (ARGON PLASMA COAGULATION/BICAP);  Surgeon: Malissa Hippo, MD;  [facility];  Service: Endoscopy;;   INTRAMEDULLARY (IM) NAIL INTERTROCHANTERIC Right 07/12/2020   Procedure: INTRAMEDULLARY (IM) NAIL INTERTROCHANTRIC;  Surgeon: Oliver Barre, MD;  Location: [facility];  Service:  Orthopedics;  Laterality: Right;   IR FLUORO GUIDE CV LINE LEFT  12/21/2022   IR US GUIDE VASC ACCESS LEFT  12/21/2022   MALONEY DILATION N/A 08/07/2016   Procedure: Elease Hashimoto DILATION;  Surgeon: Corbin Ade, MD;  [facility]: [facility];  Service: Endoscopy;  Laterality: N/A;   MASTECTOMY, PARTIAL Right    PERIPHERAL VASCULAR BALLOON ANGIOPLASTY Left 07/13/2019   Procedure: PERIPHERAL VASCULAR BALLOON ANGIOPLASTY;  Surgeon: Cephus Shelling, MD;  [facility]: [facility];  Service: Cardiovascular;  Laterality: Left;  arm fistulogram   PERIPHERAL VASCULAR BALLOON ANGIOPLASTY Right 05/22/2020   Procedure: PERIPHERAL VASCULAR BALLOON ANGIOPLASTY;  Surgeon: Leonie Douglas, MD;  [facility]: [facility];  Service: Cardiovascular;  Laterality: Right;  arm fistula   PERIPHERAL VASCULAR BALLOON ANGIOPLASTY Right 07/05/2020   Procedure: PERIPHERAL VASCULAR BALLOON ANGIOPLASTY;  Surgeon: Sherren Kerns, MD;  Location: [facility];  Service: Cardiovascular;  Laterality: Right;  arm fistula   PORT-A-CATH REMOVAL Left 11/22/2017   Procedure: REMOVAL PORT-A-CATH LEFT CHEST;  Surgeon: Sherren Kerns, MD;  Location: [facility] OR;  Service: Vascular;  Laterality: Left;   RETINAL DETACHMENT SURGERY Right    SCLEROTHERAPY  07/23/2020   Procedure: SCLEROTHERAPY;  Surgeon: Malissa Hippo, MD;  [facility];  Service: Endoscopy;;   UPPER EXTREMITY VENOGRAPHY N/A 08/22/2020   Procedure: LEFT UPPER & CENTRAL VENOGRAPHY;  Surgeon: Cephus Shelling, MD;  [facility];  Service: Cardiovascular;  Laterality: N/A;    Social History: Social History   Socioeconomic History   Marital status: Single    Spouse name: Not on file   Number of children: Not on file   Years of education: Not on file   Highest education level: Not on file  Occupational History   Occupation: retired   Tobacco Use   Smoking status: Never   Smokeless tobacco: Never  Vaping Use    Vaping status: Never Used  Substance and Sexual Activity   Alcohol use: No    Alcohol/week: 0.0 standard drinks of alcohol   Drug use: No   Sexual activity: Never  Other Topics Concern   Not on file  Social History Narrative   Long term resident of SNF    Social Drivers of Health   Financial Resource Strain: Low Risk  (01/09/2020)   Overall Financial Resource Strain (CARDIA)    Difficulty of Paying Living Expenses: Not very hard  Food Insecurity: Patient Unable To Answer (05/24/2023)   Hunger Vital Sign    Worried About Running Out of Food in the Last Year: Patient unable to answer    Ran Out of Food in the Last Year: Patient unable to answer  Transportation Needs: Patient Unable To Answer (05/24/2023)   PRAPARE - Transportation    Lack of Transportation (Medical): Patient unable to answer    Lack of Transportation (Non-Medical): Patient unable to answer  Physical Activity: Inactive (01/09/2020)   Exercise Vital Sign    Days of Exercise per Week: 0 days    Minutes of Exercise per Session: 0 min  Stress: No Stress Concern Present (01/09/2020)   [employer] of Occupational Health - Occupational Stress Questionnaire    Feeling of Stress : Not at all  Social Connections: Patient Unable To Answer (05/24/2023)   Social Connection and Isolation Panel [NHANES]    Frequency of Communication with Friends and Family: Patient unable to answer    Frequency of Social Gatherings with Friends and Family: Patient unable to answer    Attends Religious Services: Patient unable to answer    Active Member of Clubs or Organizations: Patient unable to answer    Attends Banker Meetings: Patient unable to answer    Marital Status: Patient unable to answer  Intimate Partner Violence: Patient Unable To Answer (05/24/2023)   Humiliation, Afraid, Rape, and Kick questionnaire    Fear of Current or Ex-Partner: Patient unable to answer    Emotionally Abused: Patient unable to answer     Physically Abused: Patient unable to answer    Sexually Abused: Patient unable to answer    Family History: Family History  Problem Relation Age of Onset   Multiple myeloma Sister    Brain cancer Sister  Dementia Mother        died at 77   Stroke Mother    Heart failure Mother    Diabetes Mother    Heart disease Father    Prostate cancer Brother    Colon cancer Neg Hx     Current Medications:  Current Outpatient Medications:    acetaminophen (TYLENOL) 325 MG tablet, Take 650 mg by mouth 3 (three) times daily., Disp: , Rfl:    acyclovir (ZOVIRAX) 200 MG capsule, Take 200 mg by mouth in the morning. (0800), Disp: , Rfl:    anastrozole (ARIMIDEX) 1 MG tablet, TAKE 1 TABLET BY MOUTH DAILY, Disp: 30 tablet, Rfl: 6   aspirin EC 81 MG tablet, Take 1 tablet (81 mg total) by mouth daily. Swallow whole., Disp: , Rfl:    atorvastatin (LIPITOR) 40 MG tablet, Take 1 tablet (40 mg total) by mouth daily., Disp: , Rfl:    Calcium Carb-Cholecalciferol (CALCIUM + VITAMIN D3) 500-5 MG-MCG TABS, Take 1 tablet by mouth daily., Disp: , Rfl:    melatonin 5 MG TABS, Take 5 mg by mouth at bedtime., Disp: , Rfl:    multivitamin (RENA-VIT) TABS tablet, Take 1 tablet by mouth at bedtime., Disp: , Rfl:    Nutritional Supplements (ENSURE ENLIVE PO), Take 237 mLs by mouth every evening., Disp: , Rfl:    ondansetron (ZOFRAN-ODT) 4 MG disintegrating tablet, Take 4 mg by mouth daily before breakfast., Disp: , Rfl:    pantoprazole (PROTONIX) 40 MG tablet, Take 40 mg by mouth 2 (two) times daily., Disp: , Rfl:    predniSONE (DELTASONE) 50 MG tablet, Take 1 tablet (50 mg total) by mouth daily with breakfast., Disp: , Rfl:    sennosides-docusate sodium (SENOKOT-S) 8.6-50 MG tablet, Take 1 tablet by mouth 2 (two) times daily., Disp: , Rfl:    sertraline (ZOLOFT) 25 MG tablet, Take 25 mg by mouth daily. Was taking 50 mg but it stopped on 05-25-2023, Disp: , Rfl:    sevelamer carbonate (RENVELA) 800 MG tablet, Take 800  mg by mouth daily., Disp: , Rfl:    sucralfate (CARAFATE) 1 GM/10ML suspension, Take 10 mLs by mouth 2 (two) times daily., Disp: , Rfl:  No current facility-administered medications for this visit.  Facility-Administered Medications Ordered in Other Visits:    lanreotide acetate (SOMATULINE DEPOT) 120 MG/0.5ML injection, , , ,    lanreotide acetate (SOMATULINE DEPOT) 120 MG/0.5ML injection, , , ,    lanreotide acetate (SOMATULINE DEPOT) 120 MG/0.5ML injection, , , ,    lanreotide acetate (SOMATULINE DEPOT) 120 MG/0.5ML injection, , , ,    octreotide (SANDOSTATIN LAR) 30 MG IM injection, , , ,    octreotide (SANDOSTATIN LAR) 30 MG IM injection, , , ,    Allergies: Allergies  Allergen Reactions   Ace Inhibitors Other (See Comments) and Cough    Tongue swelling Angioedema    Angiotensin Receptor Blockers Other (See Comments)    Angioedema with ACE-I   Penicillins Other (See Comments)    Unknown reaction    REVIEW OF SYSTEMS:   Review of Systems  Constitutional:  Negative for chills, fatigue and fever.  HENT:   Negative for lump/mass, mouth sores, nosebleeds, sore throat and trouble swallowing.   Eyes:  Negative for eye problems.  Respiratory:  Negative for cough and shortness of breath.   Cardiovascular:  Negative for chest pain, leg swelling and palpitations.  Gastrointestinal:  Negative for abdominal pain, constipation, diarrhea, nausea and vomiting.  Genitourinary:  Negative  for bladder incontinence, difficulty urinating, dysuria, frequency, hematuria and nocturia.   Musculoskeletal:  Negative for arthralgias, back pain, flank pain, myalgias and neck pain.  Skin:  Negative for itching and rash.  Neurological:  Positive for numbness (in fingers). Negative for dizziness and headaches.  Hematological:  Does not bruise/bleed easily.  Psychiatric/Behavioral:  Negative for depression, sleep disturbance and suicidal ideas. The patient is not nervous/anxious.   All other systems  reviewed and are negative.    VITALS:   Blood pressure (!) 154/80, pulse 79, temperature (!) 97.5 F (36.4 C), temperature source Tympanic, resp. rate 18, weight 142 lb (64.4 kg), SpO2 96%.  Wt Readings from Last 3 Encounters:  06/15/23 142 lb (64.4 kg)  06/08/23 142 lb 9.6 oz (64.7 kg)  06/01/23 145 lb 3.2 oz (65.9 kg)    Body mass index is 31.84 kg/m.  Performance status (ECOG): 1 - Symptomatic but completely ambulatory  PHYSICAL EXAM:   Physical Exam Vitals and nursing note reviewed. Exam conducted with a chaperone present.  Constitutional:      Appearance: Normal appearance.  Cardiovascular:     Rate and Rhythm: Normal rate and regular rhythm.     Pulses: Normal pulses.     Heart sounds: Normal heart sounds.  Pulmonary:     Effort: Pulmonary effort is normal.     Breath sounds: Normal breath sounds.  Abdominal:     Palpations: Abdomen is soft. There is no hepatomegaly, splenomegaly or mass.     Tenderness: There is no abdominal tenderness.  Musculoskeletal:     Right lower leg: No edema.     Left lower leg: No edema.  Lymphadenopathy:     Cervical: No cervical adenopathy.     Right cervical: No superficial, deep or posterior cervical adenopathy.    Left cervical: No superficial, deep or posterior cervical adenopathy.     Upper Body:     Right upper body: No supraclavicular or axillary adenopathy.     Left upper body: No supraclavicular or axillary adenopathy.  Neurological:     General: No focal deficit present.     Mental Status: She is alert and oriented to person, place, and time.  Psychiatric:        Mood and Affect: Mood normal.        Behavior: Behavior normal.     LABS:      Latest Ref Rng & Units 06/15/2023   11:52 AM 06/08/2023   12:13 PM 06/01/2023    1:48 PM  CBC  WBC 4.0 - 10.5 K/uL 4.3  5.3  5.7   Hemoglobin 12.0 - 15.0 g/dL 42.5  95.6  38.7   Hematocrit 36.0 - 46.0 % 35.8  33.7  35.1   Platelets 150 - 400 K/uL 171  231  290       Latest  Ref Rng & Units 06/15/2023   11:52 AM 06/08/2023   12:13 PM 06/01/2023    1:48 PM  CMP  Glucose 70 - 99 mg/dL 564  332  951   BUN 8 - 23 mg/dL 24  24  33   Creatinine 0.44 - 1.00 mg/dL 8.84  1.66  0.63   Sodium 135 - 145 mmol/L 138  135  136   Potassium 3.5 - 5.1 mmol/L 4.2  4.0  4.3   Chloride 98 - 111 mmol/L 99  94  93   CO2 22 - 32 mmol/L 25  27  26    Calcium 8.9 - 10.3 mg/dL 9.4  9.3  9.2   Total Protein 6.5 - 8.1 g/dL 7.4  7.1  7.3   Total Bilirubin 0.0 - 1.2 mg/dL 0.4  0.5  0.4   Alkaline Phos 38 - 126 U/L 87  86  73   AST 15 - 41 U/L 20  19  21    ALT 0 - 44 U/L 13  12  14       No results found for: "CEA1", "CEA" / No results found for: "CEA1", "CEA" No results found for: "PSA1" No results found for: "ZOX096" No results found for: "CAN125"  Lab Results  Component Value Date   TOTALPROTELP 6.7 05/18/2023   TOTALPROTELP 6.7 05/18/2023   ALBUMINELP 3.6 05/18/2023   A1GS 0.3 05/18/2023   A2GS 0.8 05/18/2023   BETS 1.2 05/18/2023   GAMS 0.9 05/18/2023   MSPIKE Not Observed 05/18/2023   SPEI Comment 05/18/2023   Lab Results  Component Value Date   TIBC 110 (L) 12/18/2022   TIBC 140 (L) 12/16/2021   TIBC 142 (L) 08/12/2021   FERRITIN 277 12/18/2022   FERRITIN 272 12/16/2021   FERRITIN 312 (H) 08/12/2021   IRONPCTSAT 36 (H) 12/18/2022   IRONPCTSAT 39 (H) 12/16/2021   IRONPCTSAT 65 (H) 08/12/2021   Lab Results  Component Value Date   LDH 120 10/21/2021   LDH 129 10/14/2021   LDH 136 10/07/2020     STUDIES:   ECHOCARDIOGRAM COMPLETE Result Date: 05/25/2023    ECHOCARDIOGRAM REPORT   Patient Name:   Carly Wood Date of Exam: 05/25/2023 Medical Rec #:  [id number]      Height:       56.0 in Accession #:    [id number]     Weight:       145.5 lb Date of Birth:  1935-05-26     BSA:          1.551 m Patient Age:    87 years       BP: Patient Gender: F              HR:           87 bpm. [facility] Procedure: 2D Echo (Both Spectral and Color Flow Doppler  were utilized during            procedure). Indications:    Elevated troponin                 Murmur; R01.1 Murmur  History:        Patient has prior history of Echocardiogram examinations.  Sonographer:    BW Referring Phys: [id number] EJIROGHENE E EMOKPAE IMPRESSIONS  1. Hypokinesis of the distal inferoseptum, distal inferior and apical walls     COmpared to echo from 2024, wall motion changes are new and LVEF is not as vigorous.. Left ventricular ejection fraction, by estimation, is 55 to 60%. The left ventricle has normal function. There is moderate asymmetric left ventricular hypertrophy. Indeterminate diastolic filling due to E-A fusion.  2. Right ventricular systolic function is normal. The right ventricular size is normal. Mildly increased right ventricular wall thickness.  3. Left atrial size was moderately dilated.  4. Trivial mitral valve regurgitation.  5. AV is thickened, calcified with restricted motion Peak and mean gradients through the valve are 21 and 12 mm Hg respectively AVA (VTI) is 1.1 cm2 Dimensionless index is 0.5 consistent with mild AS. 2D imaging suggests that AS is more moderate . The aortic valve is abnormal. Aortic valve regurgitation  is not visualized.  6. The inferior vena cava is normal in size with greater than 50% respiratory variability, suggesting right atrial pressure of 3 mmHg. FINDINGS  Left Ventricle: Hypokinesis of the distal inferoseptum, distal inferior and apical walls COmpared to echo from 2024, wall motion changes are new and LVEF is not as vigorous. Left ventricular ejection fraction, by estimation, is 55 to 60%. The left ventricle has normal function. The left ventricular internal cavity size was normal in size. There is moderate asymmetric left ventricular hypertrophy. Indeterminate diastolic filling due to E-A fusion. Right Ventricle: The right ventricular size is normal. Mildly increased right ventricular wall thickness. Right ventricular systolic function is normal.  Left Atrium: Left atrial size was moderately dilated. Right Atrium: Right atrial size was normal in size. Pericardium: There is no evidence of pericardial effusion. Mitral Valve: There is moderate thickening of the mitral valve leaflet(s). There is mild calcification of the mitral valve leaflet(s). Mild to moderate mitral annular calcification. Trivial mitral valve regurgitation. MV peak gradient, 8.2 mmHg. The mean  mitral valve gradient is 2.0 mmHg. Tricuspid Valve: The tricuspid valve is normal in structure. Tricuspid valve regurgitation is trivial. Aortic Valve: AV is thickened, calcified with restricted motion Peak and mean gradients through the valve are 21 and 12 mm Hg respectively AVA (VTI) is 1.1 cm2 Dimensionless index is 0.5 consistent with mild AS. 2D imaging suggests that AS is more moderate. The aortic valve is abnormal. Aortic valve regurgitation is not visualized. Aortic valve mean gradient measures 11.5 mmHg. Aortic valve peak gradient measures 21.3 mmHg. Aortic valve area, by VTI measures 1.14 cm. Pulmonic Valve: The pulmonic valve was not well visualized. Pulmonic valve regurgitation is not visualized. No evidence of pulmonic stenosis. Aorta: The aortic root is normal in size and structure. Venous: The inferior vena cava is normal in size with greater than 50% respiratory variability, suggesting right atrial pressure of 3 mmHg. IAS/Shunts: No atrial level shunt detected by color flow Doppler.  LEFT VENTRICLE PLAX 2D LVIDd:         3.80 cm LVIDs:         2.50 cm LV PW:         1.10 cm LV IVS:        1.40 cm LVOT diam:     1.70 cm LV SV:         54 LV SV Index:   35 LVOT Area:     2.27 cm  RIGHT VENTRICLE RV S prime:     16.50 cm/s TAPSE (M-mode): 2.4 cm LEFT ATRIUM             Index        RIGHT ATRIUM           Index LA diam:        3.20 cm 2.06 cm/m   RA Area:     11.20 cm LA Vol (A2C):   48.2 ml 31.08 ml/m  RA Volume:   23.50 ml  15.16 ml/m LA Vol (A4C):   94.7 ml 61.07 ml/m LA Biplane  Vol: 71.4 ml 46.05 ml/m  AORTIC VALVE AV Area (Vmax):    1.16 cm AV Area (Vmean):   1.10 cm AV Area (VTI):     1.14 cm AV Vmax:           231.00 cm/s AV Vmean:          154.500 cm/s AV VTI:            0.468  m AV Peak Grad:      21.3 mmHg AV Mean Grad:      11.5 mmHg LVOT Vmax:         118.00 cm/s LVOT Vmean:        74.800 cm/s LVOT VTI:          0.236 m LVOT/AV VTI ratio: 0.50  AORTA Ao Root diam: 3.30 cm MITRAL VALVE MV Area (PHT): 4.80 cm     SHUNTS MV Area VTI:   1.93 cm     Systemic VTI:  0.24 m MV Peak grad:  8.2 mmHg     Systemic Diam: 1.70 cm MV Mean grad:  2.0 mmHg MV Vmax:       1.43 m/s MV Vmean:      61.1 cm/s MV Decel Time: 158 msec MV E velocity: 152.00 cm/s Dietrich Pates MD Electronically signed by Dietrich Pates MD Signature Date/Time: 05/25/2023/5:21:07 PM    Final    DG Knee Complete 4 Views Right Result Date: 05/24/2023 CLINICAL DATA:  Right knee pain status post fall EXAM: RIGHT KNEE - COMPLETE 4 VIEW COMPARISON:  None Available. FINDINGS: Partially imaged right femoral fixation hardware appears intact. No evidence of fracture, dislocation, or joint effusion. Mild tricompartmental degenerative changes of the knee. Soft tissues are unremarkable. Vascular calcifications. IMPRESSION: No acute displaced fracture or dislocation. Electronically Signed   By: Agustin Cree M.D.   On: 05/24/2023 14:57   DG Hip Unilat W or Wo Pelvis 2-3 Views Right Result Date: 05/24/2023 CLINICAL DATA:  Right knee pain, status post fall. EXAM: DG HIP (WITH OR WITHOUT PELVIS) 2-3V RIGHT COMPARISON:  None Available. FINDINGS: Pelvis is intact with normal and symmetric sacroiliac joints. No acute fracture or dislocation. There is coarse trabecular pattern of the right iliac bone, favored to represent underlying Paget's disease. Right femur ORIF metallic hardware noted. Visualized sacral arcuate lines are unremarkable. There are mild degenerative changes of bilateral hip joints characterized by mild-to-moderate joint space  narrowing and osteophytosis of the superior acetabulum. No radiopaque foreign bodies. There is stable several well-circumscribed calcific densities overlying the right side of the pelvis, likely representing calcified leiomyomas. IMPRESSION: 1. No acute osseous abnormality of the pelvis or right hip joint. 2. Mild degenerative changes of bilateral hip joints. 3. Paget's disease of the right iliac bone. Electronically Signed   By: Jules Schick M.D.   On: 05/24/2023 14:54

## 2023-06-16 ENCOUNTER — Other Ambulatory Visit: Payer: Self-pay

## 2023-06-16 DIAGNOSIS — M6281 Muscle weakness (generalized): Secondary | ICD-10-CM | POA: Diagnosis not present

## 2023-06-16 DIAGNOSIS — A419 Sepsis, unspecified organism: Secondary | ICD-10-CM | POA: Diagnosis not present

## 2023-06-16 DIAGNOSIS — R279 Unspecified lack of coordination: Secondary | ICD-10-CM | POA: Diagnosis not present

## 2023-06-16 DIAGNOSIS — Z9181 History of falling: Secondary | ICD-10-CM | POA: Diagnosis not present

## 2023-06-16 DIAGNOSIS — R2681 Unsteadiness on feet: Secondary | ICD-10-CM | POA: Diagnosis not present

## 2023-06-16 DIAGNOSIS — I214 Non-ST elevation (NSTEMI) myocardial infarction: Secondary | ICD-10-CM | POA: Diagnosis not present

## 2023-06-17 DIAGNOSIS — R2681 Unsteadiness on feet: Secondary | ICD-10-CM | POA: Diagnosis not present

## 2023-06-17 DIAGNOSIS — I214 Non-ST elevation (NSTEMI) myocardial infarction: Secondary | ICD-10-CM | POA: Diagnosis not present

## 2023-06-17 DIAGNOSIS — A419 Sepsis, unspecified organism: Secondary | ICD-10-CM | POA: Diagnosis not present

## 2023-06-17 DIAGNOSIS — M6281 Muscle weakness (generalized): Secondary | ICD-10-CM | POA: Diagnosis not present

## 2023-06-17 DIAGNOSIS — Z9181 History of falling: Secondary | ICD-10-CM | POA: Diagnosis not present

## 2023-06-17 DIAGNOSIS — R279 Unspecified lack of coordination: Secondary | ICD-10-CM | POA: Diagnosis not present

## 2023-06-18 DIAGNOSIS — R2681 Unsteadiness on feet: Secondary | ICD-10-CM | POA: Diagnosis not present

## 2023-06-18 DIAGNOSIS — A419 Sepsis, unspecified organism: Secondary | ICD-10-CM | POA: Diagnosis not present

## 2023-06-18 DIAGNOSIS — Z992 Dependence on renal dialysis: Secondary | ICD-10-CM | POA: Diagnosis not present

## 2023-06-18 DIAGNOSIS — R279 Unspecified lack of coordination: Secondary | ICD-10-CM | POA: Diagnosis not present

## 2023-06-18 DIAGNOSIS — M6281 Muscle weakness (generalized): Secondary | ICD-10-CM | POA: Diagnosis not present

## 2023-06-18 DIAGNOSIS — I214 Non-ST elevation (NSTEMI) myocardial infarction: Secondary | ICD-10-CM | POA: Diagnosis not present

## 2023-06-18 DIAGNOSIS — Z9181 History of falling: Secondary | ICD-10-CM | POA: Diagnosis not present

## 2023-06-18 DIAGNOSIS — N186 End stage renal disease: Secondary | ICD-10-CM | POA: Diagnosis not present

## 2023-06-21 DIAGNOSIS — R2681 Unsteadiness on feet: Secondary | ICD-10-CM | POA: Diagnosis not present

## 2023-06-21 DIAGNOSIS — A419 Sepsis, unspecified organism: Secondary | ICD-10-CM | POA: Diagnosis not present

## 2023-06-21 DIAGNOSIS — Z9181 History of falling: Secondary | ICD-10-CM | POA: Diagnosis not present

## 2023-06-21 DIAGNOSIS — I214 Non-ST elevation (NSTEMI) myocardial infarction: Secondary | ICD-10-CM | POA: Diagnosis not present

## 2023-06-21 DIAGNOSIS — N186 End stage renal disease: Secondary | ICD-10-CM | POA: Diagnosis not present

## 2023-06-21 DIAGNOSIS — R279 Unspecified lack of coordination: Secondary | ICD-10-CM | POA: Diagnosis not present

## 2023-06-21 DIAGNOSIS — M6281 Muscle weakness (generalized): Secondary | ICD-10-CM | POA: Diagnosis not present

## 2023-06-21 DIAGNOSIS — Z992 Dependence on renal dialysis: Secondary | ICD-10-CM | POA: Diagnosis not present

## 2023-06-22 ENCOUNTER — Ambulatory Visit: Attending: Student | Admitting: Student

## 2023-06-22 ENCOUNTER — Encounter: Payer: Self-pay | Admitting: Student

## 2023-06-22 ENCOUNTER — Encounter: Payer: Self-pay | Admitting: Adult Health

## 2023-06-22 ENCOUNTER — Non-Acute Institutional Stay (SKILLED_NURSING_FACILITY): Payer: Self-pay | Admitting: Adult Health

## 2023-06-22 VITALS — BP 122/74 | HR 93 | Ht 63.0 in

## 2023-06-22 DIAGNOSIS — N186 End stage renal disease: Secondary | ICD-10-CM

## 2023-06-22 DIAGNOSIS — I350 Nonrheumatic aortic (valve) stenosis: Secondary | ICD-10-CM

## 2023-06-22 DIAGNOSIS — I70209 Unspecified atherosclerosis of native arteries of extremities, unspecified extremity: Secondary | ICD-10-CM

## 2023-06-22 DIAGNOSIS — C9000 Multiple myeloma not having achieved remission: Secondary | ICD-10-CM | POA: Diagnosis not present

## 2023-06-22 DIAGNOSIS — Z9181 History of falling: Secondary | ICD-10-CM | POA: Diagnosis not present

## 2023-06-22 DIAGNOSIS — I214 Non-ST elevation (NSTEMI) myocardial infarction: Secondary | ICD-10-CM | POA: Diagnosis not present

## 2023-06-22 DIAGNOSIS — R2681 Unsteadiness on feet: Secondary | ICD-10-CM | POA: Diagnosis not present

## 2023-06-22 DIAGNOSIS — E1122 Type 2 diabetes mellitus with diabetic chronic kidney disease: Secondary | ICD-10-CM

## 2023-06-22 DIAGNOSIS — E782 Mixed hyperlipidemia: Secondary | ICD-10-CM | POA: Diagnosis not present

## 2023-06-22 DIAGNOSIS — F01518 Vascular dementia, unspecified severity, with other behavioral disturbance: Secondary | ICD-10-CM

## 2023-06-22 DIAGNOSIS — Z992 Dependence on renal dialysis: Secondary | ICD-10-CM

## 2023-06-22 DIAGNOSIS — R279 Unspecified lack of coordination: Secondary | ICD-10-CM | POA: Diagnosis not present

## 2023-06-22 DIAGNOSIS — M6281 Muscle weakness (generalized): Secondary | ICD-10-CM | POA: Diagnosis not present

## 2023-06-22 DIAGNOSIS — A419 Sepsis, unspecified organism: Secondary | ICD-10-CM | POA: Diagnosis not present

## 2023-06-22 NOTE — Patient Instructions (Signed)
 Medication Instructions:  Your physician recommends that you continue on your current medications as directed. Please refer to the Current Medication list given to you today.   *If you need a refill on your cardiac medications before your next appointment, please call your pharmacy*  Lab Work: Your physician recommends that you return for lab work in: 2 Months   If you have labs (blood work) drawn today and your tests are completely normal, you will receive your results only by: MyChart Message (if you have MyChart) OR A paper copy in the mail If you have any lab test that is abnormal or we need to change your treatment, we will call you to review the results.  Testing/Procedures: NONE    Follow-Up: At [facility], you and your health needs are our priority.  As part of our continuing mission to provide you with exceptional heart care, our providers are all part of one team.  This team includes your primary Cardiologist (physician) and Advanced Practice Providers or APPs (Physician Assistants and Nurse Practitioners) who all work together to provide you with the care you need, when you need it.  Your next appointment:   6 month(s)  Provider:   You may see Ola Berger, MD or one of the following Advanced Practice Providers on your designated Care Team:   Woodfin Hays, PA-C  [place], [place] Theotis Flake, [place]     We recommend signing up for the patient portal called "MyChart".  Sign up information is provided on this After Visit Summary.  MyChart is used to connect with patients for Virtual Visits (Telemedicine).  Patients are able to view lab/test results, encounter notes, upcoming appointments, etc.  Non-urgent messages can be sent to your provider as well.   To learn more about what you can do with MyChart, go to [email or web address].   Other Instructions Thank you for choosing [facility]!

## 2023-06-22 NOTE — Progress Notes (Signed)
 Cardiology Office Note    Date:  06/22/2023  ID:  RUWAYDA CURET, DOB 1935-11-02, MRN [id number] Cardiologist: Dietrich Pates, MD    History of Present Illness:    Carly Wood is a 88 y.o. female with past medical history of HTN, HLD, ESRD, multiple myeloma, history of breast cancer and dementia who presents to the office today for hospital follow-up.  She most recently presented to [facility] in 05/2023 after having a reported syncopal episode while at [facility].  However, it was later determined by the hospitalist that reached out to the [facility] that she did not have a syncopal episode but had been sent to dialysis and complained about knee pain and refused dialysis, therefore she was sent to the ED for further evaluation. Cardiology was consulted due to an NSTEMI as Hs Troponin values were elevated up to 4099. EKG showed TWI along the inferior leads. Echocardiogram showed a preserved EF of 55 to 60% but she did have hypokinesis of the distal inferoseptum, and distal inferior and apical walls. It was felt that she likely had CAD but given her age and comorbidities, ischemic evaluation was not recommended. She was treated with IV Heparin for 48 hours along with being started on ASA and Atorvastatin.   In talking with the patient today, her recollection of recent events is limited given her dementia. She denies any recent chest pain or dyspnea on exertion. Mostly wheelchair bound but does use a walker for transfers and walking short distances to the restroom. No recent orthopnea, PND or pitting edema. Goes to HD - MWF schedule. Says her blood pressure is sometimes soft at dialysis and they have to stop her sessions early.    Studies Reviewed:   EKG: EKG is not ordered today.  Echocardiogram: 05/2023 IMPRESSIONS     1. Hypokinesis of the distal inferoseptum, distal inferior and apical  walls     COmpared to echo from 2024, wall motion changes are new and LVEF is  not as vigorous.. Left  ventricular ejection fraction, by estimation, is 55  to 60%. The left ventricle has normal function. There is moderate  asymmetric left ventricular hypertrophy.  Indeterminate diastolic filling due to E-A fusion.   2. Right ventricular systolic function is normal. The right ventricular  size is normal. Mildly increased right ventricular wall thickness.   3. Left atrial size was moderately dilated.   4. Trivial mitral valve regurgitation.   5. AV is thickened, calcified with restricted motion Peak and mean  gradients through the valve are 21 and 12 mm Hg respectively AVA (VTI) is  1.1 cm2 Dimensionless index is 0.5 consistent with mild AS. 2D imaging  suggests that AS is more moderate . The  aortic valve is abnormal. Aortic valve regurgitation is not visualized.   6. The inferior vena cava is normal in size with greater than 50%  respiratory variability, suggesting right atrial pressure of 3 mmHg.   Physical Exam:   VS:  BP 122/74 (BP Location: Right Arm, Cuff Size: Normal)   Pulse 93   Ht 5\' 3"  (1.6 m)   SpO2 96%   BMI 24.91 kg/m    Wt Readings from Last 3 Encounters:  06/22/23 140 lb 9.6 oz (63.8 kg)  06/15/23 142 lb (64.4 kg)  06/08/23 142 lb 9.6 oz (64.7 kg)     GEN: Elderly female appearing in no acute distress. Sitting in wheelchair.  NECK: No JVD; No carotid bruits CARDIAC: RRR, 2/6 SEM along RUSB.  RESPIRATORY:  Clear to auscultation without rales, wheezing or rhonchi  ABDOMEN: Appears non-distended. No obvious abdominal masses. EXTREMITIES: No clubbing or cyanosis. Trace ankle edema bilaterally.  Distal pedal pulses are 2+ bilaterally.   Assessment and Plan:   1. Subsequent Episode of Care for NSTEMI - She was recently admitted for an NSTEMI as outlined above and medical management was recommended given her comorbidities and asymptomatic state. She continues to deny any recent anginal symptoms although history is limited given her dementia. - Continue current medical  therapy with ASA 81 mg daily and Atorvastatin 40 mg daily. Would not add a beta-blocker or Imdur at this time given episodes of hypotension during HD.   2. Aortic Stenosis - This was mild to moderate by echocardiogram last month. Would likely not be a candidate for TAVR given her dementia and comorbidities.   3. HLD - LDL was at 115 when checked last year. She was recently started on Atorvastatin 40mg  daily. Will recheck FLP and LFT's in 6-8 weeks.   4. Multiple Myeloma - Followed by Heme/Onc. Remains on weekly Velcade.   5. ESRD - On HD - MWF schedule.   Signed, Dorma Gash, PA-C

## 2023-06-22 NOTE — Progress Notes (Signed)
 Location:  [facility] Room Number: 145 Place of Service:  SNF (31)   CODE STATUS: dnr   Allergies  Allergen Reactions   Ace Inhibitors Other (See Comments) and Cough    Tongue swelling Angioedema    Angiotensin Receptor Blockers Other (See Comments)    Angioedema with ACE-I   Penicillins Other (See Comments)    Unknown reaction    Chief Complaint  Patient presents with   Acute Visit    Care plan meeting     HPI:  We have come together for her care plan meeting. BIMS 5/15 mood 0/30. Will decline showers. Uses wheelchair without falls. She requires moderate to dependent assist with her adl care. She is frequently incontinent of bladder and bowel. Dietary: setup for meals D3 1200cc fluid restriction weight 140.6 pounds; appetite 51-100%. Therapy: completes today: mod assist for transfers; mod assist for BRP; upper body min assist; lower body mod assist; BCAT 25/50. She will continue to be followed for her chronic illnesses including: Atherosclerotic peripheral vascular disease   Diabetes mellitus with end stage renal disease (ESRD)  Vascular dementia with behavioral disturbance  Past Medical History:  Diagnosis Date   Anemia     chronic macrocytic anemia   Anxiety    Chronic kidney disease    Chronic renal disease, stage 4, severely decreased glomerular filtration rate (GFR) between 15-29 mL/min/1.73 square meter (HCC) 08/22/2015   Complication of anesthesia    delirious after Breast Surgery   Dementia (HCC)    mild   Depression    Diabetes mellitus with ESRD (end-stage renal disease) (HCC)    type II   Dysphagia    Dyspnea    with activity   GERD (gastroesophageal reflux disease)    Glaucoma    Hyperlipidemia    Hypertension    Multiple myeloma (HCC)    Pneumonia    Stage 1 infiltrating ductal carcinoma of right female breast (HCC) 08/21/2015   ER+ PR+ HER 2 neu + (3+) T1cN0     Past Surgical History:  Procedure Laterality Date   A/V  FISTULAGRAM Right 07/05/2020   Procedure: A/V Fistulagram;  Surgeon: Sherren Kerns, MD;  [facility]: [facility];  Service: Cardiovascular;  Laterality: Right;   AV FISTULA PLACEMENT Left 11/22/2017   Procedure: ARTERIOVENOUS (AV) FISTULA CREATION LEFT ARM;  Surgeon: Sherren Kerns, MD;  [facility]: [facility] OR;  Service: Vascular;  Laterality: Left;   AV FISTULA PLACEMENT Right 04/04/2020   Procedure: RIGHT ARM ARTERIOVENOUS FISTULA CREATION;  Surgeon: Larina Earthly, MD;  Location: [facility] ORS;  Service: Vascular;  Laterality: Right;   AV FISTULA PLACEMENT Right 08/20/2020   Procedure: ARTERIOVENOUS (AV) FISTULA LIGATION RIGHT ARM;  Surgeon: Sherren Kerns, MD;  Location: [facility] OR;  Service: Vascular;  Laterality: Right;   BIOPSY  08/07/2016   Procedure: BIOPSY;  Surgeon: Corbin Ade, MD;  Location: [facility];  Service: Endoscopy;;  gastric ulcer biopsy   COLONOSCOPY     ESOPHAGOGASTRODUODENOSCOPY N/A 08/07/2016   LA Grade A esophagitis s/p dilation, small hiatal hernia, multiple gastric ulcers and erosions, duodenal erosions s/p biopsy. Negative H.pylori    ESOPHAGOGASTRODUODENOSCOPY N/A 11/27/2016   normal esophagus, previously noted gastric ulcers completely healed, normal duodenum.    ESOPHAGOGASTRODUODENOSCOPY (EGD) WITH PROPOFOL N/A 07/23/2020   Procedure: ESOPHAGOGASTRODUODENOSCOPY (EGD) WITH PROPOFOL;  Surgeon: Malissa Hippo, MD;  [facility]: [facility];  Service: Endoscopy;  Laterality: N/A;   FISTULA SUPERFICIALIZATION Left 02/14/2018  Procedure: FISTULA SUPERFICIALIZATION LEFT ARM;  Surgeon: Chuck Hint, MD;  Location: [facility] OR;  Service: Vascular;  Laterality: Left;   FRACTURE SURGERY Right    ankle   HOT HEMOSTASIS  07/23/2020   Procedure: HOT HEMOSTASIS (ARGON PLASMA COAGULATION/BICAP);  Surgeon: Malissa Hippo, MD;  [facility]: [facility];  Service: Endoscopy;;   INTRAMEDULLARY (IM) NAIL INTERTROCHANTERIC Right 07/12/2020   Procedure: INTRAMEDULLARY (IM)  NAIL INTERTROCHANTRIC;  Surgeon: Oliver Barre, MD;  Location: [facility];  Service: Orthopedics;  Laterality: Right;   IR FLUORO GUIDE CV LINE LEFT  12/21/2022   IR US GUIDE VASC ACCESS LEFT  12/21/2022   MALONEY DILATION N/A 08/07/2016   Procedure: Elease Hashimoto DILATION;  Surgeon: Corbin Ade, MD;  [facility];  Service: Endoscopy;  Laterality: N/A;   MASTECTOMY, PARTIAL Right    PERIPHERAL VASCULAR BALLOON ANGIOPLASTY Left 07/13/2019   Procedure: PERIPHERAL VASCULAR BALLOON ANGIOPLASTY;  Surgeon: Cephus Shelling, MD;  Location: [facility];  Service: Cardiovascular;  Laterality: Left;  arm fistulogram   PERIPHERAL VASCULAR BALLOON ANGIOPLASTY Right 05/22/2020   Procedure: PERIPHERAL VASCULAR BALLOON ANGIOPLASTY;  Surgeon: Leonie Douglas, MD;  Location: [facility];  Service: Cardiovascular;  Laterality: Right;  arm fistula   PERIPHERAL VASCULAR BALLOON ANGIOPLASTY Right 07/05/2020   Procedure: PERIPHERAL VASCULAR BALLOON ANGIOPLASTY;  Surgeon: Sherren Kerns, MD;  Location: [facility];  Service: Cardiovascular;  Laterality: Right;  arm fistula   PORT-A-CATH REMOVAL Left 11/22/2017   Procedure: REMOVAL PORT-A-CATH LEFT CHEST;  Surgeon: Sherren Kerns, MD;  Location: [facility] OR;  Service: Vascular;  Laterality: Left;   RETINAL DETACHMENT SURGERY Right    SCLEROTHERAPY  07/23/2020   Procedure: SCLEROTHERAPY;  Surgeon: Malissa Hippo, MD;  [facility]: [facility];  Service: Endoscopy;;   UPPER EXTREMITY VENOGRAPHY N/A 08/22/2020   Procedure: LEFT UPPER & CENTRAL VENOGRAPHY;  Surgeon: Cephus Shelling, MD;  Location: [facility];  Service: Cardiovascular;  Laterality: N/A;    Social History   Socioeconomic History   Marital status: Single    Spouse name: Not on file   Number of children: Not on file   Years of education: Not on file   Highest education level: Not on file  Occupational History   Occupation: retired   Tobacco Use    Smoking status: Never   Smokeless tobacco: Never  Vaping Use   Vaping status: Never Used  Substance and Sexual Activity   Alcohol use: No    Alcohol/week: 0.0 standard drinks of alcohol   Drug use: No   Sexual activity: Never  Other Topics Concern   Not on file  Social History Narrative   Long term resident of SNF    Social Drivers of Health   Financial Resource Strain: Low Risk  (01/09/2020)   Overall Financial Resource Strain (CARDIA)    Difficulty of Paying Living Expenses: Not very hard  Food Insecurity: Patient Unable To Answer (05/24/2023)   Hunger Vital Sign    Worried About Running Out of Food in the Last Year: Patient unable to answer    Ran Out of Food in the Last Year: Patient unable to answer  Transportation Needs: Patient Unable To Answer (05/24/2023)   PRAPARE - Transportation    Lack of Transportation (Medical): Patient unable to answer    Lack of Transportation (Non-Medical): Patient unable to answer  Physical Activity: Inactive (01/09/2020)   Exercise Vital Sign    Days of Exercise  per Week: 0 days    Minutes of Exercise per Session: 0 min  Stress: No Stress Concern Present (01/09/2020)   [employer] of Occupational Health - Occupational Stress Questionnaire    Feeling of Stress : Not at all  Social Connections: Patient Unable To Answer (05/24/2023)   Social Connection and Isolation Panel [NHANES]    Frequency of Communication with Friends and Family: Patient unable to answer    Frequency of Social Gatherings with Friends and Family: Patient unable to answer    Attends Religious Services: Patient unable to answer    Active Member of Clubs or Organizations: Patient unable to answer    Attends Banker Meetings: Patient unable to answer    Marital Status: Patient unable to answer  Intimate Partner Violence: Patient Unable To Answer (05/24/2023)   Humiliation, Afraid, Rape, and Kick questionnaire    Fear of Current or Ex-Partner: Patient unable to  answer    Emotionally Abused: Patient unable to answer    Physically Abused: Patient unable to answer    Sexually Abused: Patient unable to answer   Family History  Problem Relation Age of Onset   Multiple myeloma Sister    Brain cancer Sister    Dementia Mother        died at 25   Stroke Mother    Heart failure Mother    Diabetes Mother    Heart disease Father    Prostate cancer Brother    Colon cancer Neg Hx       VITAL SIGNS BP 124/87   Pulse 85   Temp 97.8 F (36.6 C)   Resp 18   Ht 5\' 3"  (1.6 m)   Wt 140 lb 9.6 oz (63.8 kg)   SpO2 100%   BMI 24.91 kg/m   Outpatient Encounter Medications as of 06/22/2023  Medication Sig   acetaminophen (TYLENOL) 325 MG tablet Take 650 mg by mouth 3 (three) times daily.   acyclovir (ZOVIRAX) 200 MG capsule Take 200 mg by mouth in the morning. (0800)   anastrozole (ARIMIDEX) 1 MG tablet TAKE 1 TABLET BY MOUTH DAILY   aspirin EC 81 MG tablet Take 1 tablet (81 mg total) by mouth daily. Swallow whole.   atorvastatin (LIPITOR) 40 MG tablet Take 1 tablet (40 mg total) by mouth daily.   Calcium Carb-Cholecalciferol (CALCIUM + VITAMIN D3) 500-5 MG-MCG TABS Take 1 tablet by mouth daily.   melatonin 5 MG TABS Take 5 mg by mouth at bedtime.   multivitamin (RENA-VIT) TABS tablet Take 1 tablet by mouth at bedtime.   Nutritional Supplements (ENSURE ENLIVE PO) Take 237 mLs by mouth every evening.   ondansetron (ZOFRAN-ODT) 4 MG disintegrating tablet Take 4 mg by mouth daily before breakfast.   pantoprazole (PROTONIX) 40 MG tablet Take 40 mg by mouth 2 (two) times daily.   predniSONE (DELTASONE) 50 MG tablet Take 1 tablet (50 mg total) by mouth daily with breakfast.   sennosides-docusate sodium (SENOKOT-S) 8.6-50 MG tablet Take 1 tablet by mouth 2 (two) times daily.   sertraline (ZOLOFT) 25 MG tablet Take 25 mg by mouth daily. Was taking 50 mg but it stopped on 05-25-2023   sevelamer carbonate (RENVELA) 800 MG tablet Take 800 mg by mouth daily.    sucralfate (CARAFATE) 1 GM/10ML suspension Take 10 mLs by mouth 2 (two) times daily.   Facility-Administered Encounter Medications as of 06/22/2023  Medication   lanreotide acetate (SOMATULINE DEPOT) 120 MG/0.5ML injection   lanreotide acetate (SOMATULINE DEPOT)  120 MG/0.5ML injection   lanreotide acetate (SOMATULINE DEPOT) 120 MG/0.5ML injection   lanreotide acetate (SOMATULINE DEPOT) 120 MG/0.5ML injection   octreotide (SANDOSTATIN LAR) 30 MG IM injection   octreotide (SANDOSTATIN LAR) 30 MG IM injection     SIGNIFICANT DIAGNOSTIC EXAMS  LABS REVIEWED PREVIOUS     05-19-22: wbc 4.1; hgb 9.6; hct 30.9; mcv 98.7 plt 236; glucose 170; bun 33; creat 6.03; k+ 3.9; na++ 137; ca 8.1; gfr 6; protein 6.6 albumin 3.4; mag 2.1  06-23-22: wbc 4.9; hgb 9.1; hct 28.7 mcv 101.4 plt 286; glucose 158; bun 25; creat 5.85; k+ 3.9 na++ 136; ca 8.6 gfr 7 protein 6.8 albumin 3.3 mag 2.2 07-02-22: hgb A1c 5.1 07-07-22: wbc 4.9; hgb 10.3; hct 32.8; mcv 102.5 plt 265; glucose 141; bun 32; creat 6.15; k+ 4.3; na++ 136; ca 8.9; gfr 6 protein 7.2 albumin 3.7; mag 2.3 07-20-22: vitamin B 12: 660; tsh 1.836 08-04-22: wbc 4.6; hgb 10.7; ht 33.1; mcv 100.3 plt 234; glucose 120; bun 41; creat 6.46; k+ 4.2; na++ 135; ca 9.8; gfr 6; protein 7.3; albumin 3.7 mag 2.4 09-15-22: wbc 5.2; hgb 9.9; hct 31.0; mcv 103.3 plt 224; glucose 132; bun 22; creat 6.19; k+ 4.2; na++ 135; ca 9.5 gfr 6 protein 6.9 albumin 3.7     11-10-22: wbc 4.0; hgb 10.7; hct 33.4; mcv 102.1 plt 187; glucose 110; bun 28; creat 6.16; k+ 4.8; na++ 130; ca 9.0; gfr 6; protein 6.8 albumin 3.6 mag 2.4  12-16-22: wbc 6.7; hgb 12.4; hct 38.3; mcv 101.3 plt 201; glucose 123; bun 22; creat 4.81; k+ 4.1; na++ 133; ca 9.4; gfr 8; protein 7.9; albumin 4.1; blood culture: staphylococcus hominis  12-22-22: wbc 4.2; hgb 9.8; hct 30.1; mcv 100.0; plt 184; glucose 90; bun 81; creat 12.02; k+ 5.0; na++ 136; ca 8.8; gfr 3; phos 10.8; albumin 3.2 02-16-23: wbc 4.9; hgb 9.2; hct 28.8;  mcv 104.7 plt 215 glucose 194; bun 32; creat 8.17; k+ 5.1; na++ 9.8;gfr 4; protein 7.5 albumin 3.9 mag 2.6    NO NEW LABS.   Review of Systems  Constitutional:  Negative for malaise/fatigue.  Respiratory:  Negative for cough and shortness of breath.   Cardiovascular:  Negative for chest pain, palpitations and leg swelling.  Gastrointestinal:  Negative for abdominal pain, constipation and heartburn.  Musculoskeletal:  Negative for back pain, joint pain and myalgias.  Skin: Negative.   Neurological:  Negative for dizziness.  Psychiatric/Behavioral:  The patient is not nervous/anxious.    Physical Exam Constitutional:      General: She is not in acute distress.    Appearance: She is well-developed. She is not diaphoretic.  Neck:     Thyroid: No thyromegaly.  Cardiovascular:     Rate and Rhythm: Normal rate and regular rhythm.     Heart sounds: Normal heart sounds.  Pulmonary:     Effort: Pulmonary effort is normal. No respiratory distress.     Breath sounds: Normal breath sounds.  Abdominal:     General: Bowel sounds are normal. There is no distension.     Palpations: Abdomen is soft.     Tenderness: There is no abdominal tenderness.  Musculoskeletal:        General: Normal range of motion.     Cervical back: Neck supple.     Right lower leg: Edema present.     Left lower leg: Edema present.  Lymphadenopathy:     Cervical: No cervical adenopathy.  Skin:    General: Skin is  warm and dry.  Neurological:     Mental Status: She is alert. Mental status is at baseline.  Psychiatric:        Mood and Affect: Mood normal.      ASSESSMENT/ PLAN:  TODAY  Atherosclerotic peripheral vascular disease Diabetes mellitus with end stage renal disease (ESRD) Vascular dementia with behavioral disturbance   Will continue current medications Will continue current plan of care Will continue to monitor her status.   Time spent with patient: 40 minutes: medications; dietary therapy     Britt Candle NP [facility]   call [phone]

## 2023-06-23 DIAGNOSIS — Z992 Dependence on renal dialysis: Secondary | ICD-10-CM | POA: Diagnosis not present

## 2023-06-23 DIAGNOSIS — Z794 Long term (current) use of insulin: Secondary | ICD-10-CM | POA: Diagnosis not present

## 2023-06-23 DIAGNOSIS — N186 End stage renal disease: Secondary | ICD-10-CM | POA: Diagnosis not present

## 2023-06-23 DIAGNOSIS — E1151 Type 2 diabetes mellitus with diabetic peripheral angiopathy without gangrene: Secondary | ICD-10-CM | POA: Diagnosis not present

## 2023-06-23 DIAGNOSIS — L84 Corns and callosities: Secondary | ICD-10-CM | POA: Diagnosis not present

## 2023-06-23 DIAGNOSIS — L602 Onychogryphosis: Secondary | ICD-10-CM | POA: Diagnosis not present

## 2023-06-23 DIAGNOSIS — L603 Nail dystrophy: Secondary | ICD-10-CM | POA: Diagnosis not present

## 2023-06-24 ENCOUNTER — Inpatient Hospital Stay

## 2023-06-24 VITALS — BP 137/75 | HR 88 | Temp 97.8°F | Resp 20 | Wt 142.0 lb

## 2023-06-24 DIAGNOSIS — Z1721 Progesterone receptor positive status: Secondary | ICD-10-CM | POA: Diagnosis not present

## 2023-06-24 DIAGNOSIS — Z79811 Long term (current) use of aromatase inhibitors: Secondary | ICD-10-CM | POA: Diagnosis not present

## 2023-06-24 DIAGNOSIS — Z7952 Long term (current) use of systemic steroids: Secondary | ICD-10-CM | POA: Diagnosis not present

## 2023-06-24 DIAGNOSIS — C50411 Malignant neoplasm of upper-outer quadrant of right female breast: Secondary | ICD-10-CM | POA: Diagnosis not present

## 2023-06-24 DIAGNOSIS — Z5112 Encounter for antineoplastic immunotherapy: Secondary | ICD-10-CM | POA: Diagnosis not present

## 2023-06-24 DIAGNOSIS — Z170 Estrogen receptor positive status [ER+]: Secondary | ICD-10-CM | POA: Diagnosis not present

## 2023-06-24 DIAGNOSIS — C9000 Multiple myeloma not having achieved remission: Secondary | ICD-10-CM

## 2023-06-24 DIAGNOSIS — Z79624 Long term (current) use of inhibitors of nucleotide synthesis: Secondary | ICD-10-CM | POA: Diagnosis not present

## 2023-06-24 DIAGNOSIS — Z1732 Human epidermal growth factor receptor 2 negative status: Secondary | ICD-10-CM | POA: Diagnosis not present

## 2023-06-24 LAB — COMPREHENSIVE METABOLIC PANEL WITH GFR
ALT: 11 U/L (ref 0–44)
AST: 22 U/L (ref 15–41)
Albumin: 3.6 g/dL (ref 3.5–5.0)
Alkaline Phosphatase: 89 U/L (ref 38–126)
Anion gap: 14 (ref 5–15)
BUN: 21 mg/dL (ref 8–23)
CO2: 27 mmol/L (ref 22–32)
Calcium: 9.4 mg/dL (ref 8.9–10.3)
Chloride: 93 mmol/L — ABNORMAL LOW (ref 98–111)
Creatinine, Ser: 5.52 mg/dL — ABNORMAL HIGH (ref 0.44–1.00)
GFR, Estimated: 7 mL/min — ABNORMAL LOW (ref >60)
Glucose, Bld: 154 mg/dL — ABNORMAL HIGH (ref 70–99)
Potassium: 3.9 mmol/L (ref 3.5–5.1)
Sodium: 134 mmol/L — ABNORMAL LOW (ref 135–145)
Total Bilirubin: 0.4 mg/dL (ref 0.0–1.2)
Total Protein: 7.3 g/dL (ref 6.5–8.1)

## 2023-06-24 LAB — CBC WITH DIFFERENTIAL/PLATELET
Abs Immature Granulocytes: 0.01 10*3/uL (ref 0.00–0.07)
Basophils Absolute: 0 10*3/uL (ref 0.0–0.1)
Basophils Relative: 1 %
Eosinophils Absolute: 0.2 10*3/uL (ref 0.0–0.5)
Eosinophils Relative: 4 %
HCT: 33.6 % — ABNORMAL LOW (ref 36.0–46.0)
Hemoglobin: 10.6 g/dL — ABNORMAL LOW (ref 12.0–15.0)
Immature Granulocytes: 0 %
Lymphocytes Relative: 17 %
Lymphs Abs: 0.9 10*3/uL (ref 0.7–4.0)
MCH: 31.8 pg (ref 26.0–34.0)
MCHC: 31.5 g/dL (ref 30.0–36.0)
MCV: 100.9 fL — ABNORMAL HIGH (ref 80.0–100.0)
Monocytes Absolute: 0.5 10*3/uL (ref 0.1–1.0)
Monocytes Relative: 10 %
Neutro Abs: 3.8 10*3/uL (ref 1.7–7.7)
Neutrophils Relative %: 68 %
Platelets: 203 10*3/uL (ref 150–400)
RBC: 3.33 MIL/uL — ABNORMAL LOW (ref 3.87–5.11)
RDW: 16.2 % — ABNORMAL HIGH (ref 11.5–15.5)
WBC: 5.5 10*3/uL (ref 4.0–10.5)
nRBC: 0 % (ref 0.0–0.2)

## 2023-06-24 LAB — MAGNESIUM: Magnesium: 2.2 mg/dL (ref 1.7–2.4)

## 2023-06-24 MED ORDER — PROCHLORPERAZINE MALEATE 10 MG PO TABS
10.0000 mg | ORAL_TABLET | Freq: Once | ORAL | Status: AC
  Administered 2023-06-24: 10 mg via ORAL
  Filled 2023-06-24: qty 1

## 2023-06-24 MED ORDER — BORTEZOMIB CHEMO SQ INJECTION 3.5 MG (2.5MG/ML)
1.2000 mg/m2 | Freq: Once | INTRAMUSCULAR | Status: AC
  Administered 2023-06-24: 2 mg via SUBCUTANEOUS
  Filled 2023-06-24: qty 0.8

## 2023-06-24 NOTE — Progress Notes (Signed)
Patient tolerated Velcade injection with no complaints voiced.  Lab work reviewed.  See MAR for details.  Injection site clean and dry with no bruising or swelling noted.  Patient stable during and after injection.  Band aid applied.  VSS.  Patient left in satisfactory condition with no s/s of distress noted. All follow ups as scheduled.  [employer]

## 2023-06-24 NOTE — Patient Instructions (Signed)
 [facility] [place] [facility]  Discharge Instructions: Thank you for choosing [facility] to provide your oncology and hematology care.  If you have a lab appointment with the Cancer Center - please note that after April 8th, 2024, all labs will be drawn in the cancer center.  You do not have to check in or register with the main entrance as you have in the past but will complete your check-in in the cancer center.  Wear comfortable clothing and clothing appropriate for easy access to any Portacath or PICC line.   We strive to give you quality time with your provider. You may need to reschedule your appointment if you arrive late (15 or more minutes).  Arriving late affects you and other patients whose appointments are after yours.  Also, if you miss three or more appointments without notifying the office, you may be dismissed from the clinic at the provider's discretion.      For prescription refill requests, have your pharmacy contact our office and allow 72 hours for refills to be completed.    Today you received the following chemotherapy and/or immunotherapy agents velcade     To help prevent nausea and vomiting after your treatment, we encourage you to take your nausea medication as directed.  BELOW ARE SYMPTOMS THAT SHOULD BE REPORTED IMMEDIATELY: *FEVER GREATER THAN 100.4 F (38 C) OR HIGHER *CHILLS OR SWEATING *NAUSEA AND VOMITING THAT IS NOT CONTROLLED WITH YOUR NAUSEA MEDICATION *UNUSUAL SHORTNESS OF BREATH *UNUSUAL BRUISING OR BLEEDING *URINARY PROBLEMS (pain or burning when urinating, or frequent urination) *BOWEL PROBLEMS (unusual diarrhea, constipation, pain near the anus) TENDERNESS IN MOUTH AND THROAT WITH OR WITHOUT PRESENCE OF ULCERS (sore throat, sores in mouth, or a toothache) UNUSUAL RASH, SWELLING OR PAIN  UNUSUAL VAGINAL DISCHARGE OR ITCHING   Items with * indicate a potential emergency and should be followed up as  soon as possible or go to the Emergency Department if any problems should occur.  Please show the CHEMOTHERAPY ALERT CARD or IMMUNOTHERAPY ALERT CARD at check-in to the Emergency Department and triage nurse.  Should you have questions after your visit or need to cancel or reschedule your appointment, please contact [facility] CANCER CTR [place] - A DEPT OF Eligha Bridegroom [facility] [phone]  and follow the prompts.  Office hours are 8:00 a.m. to 4:30 p.m. Monday - Friday. Please note that voicemails left after 4:00 p.m. may not be returned until the following business day.  We are closed weekends and major holidays. You have access to a nurse at all times for urgent questions. Please call the main number to the clinic [phone] and follow the prompts.  For any non-urgent questions, you may also contact your provider using MyChart. We now offer e-Visits for anyone 62 and older to request care online for non-urgent symptoms. For details visit [email or web address].   Also download the MyChart app! Go to the app store, search "MyChart", open the app, select [place], and log in with your MyChart username and password.

## 2023-06-25 DIAGNOSIS — N186 End stage renal disease: Secondary | ICD-10-CM | POA: Diagnosis not present

## 2023-06-25 DIAGNOSIS — Z992 Dependence on renal dialysis: Secondary | ICD-10-CM | POA: Diagnosis not present

## 2023-06-28 DIAGNOSIS — Z992 Dependence on renal dialysis: Secondary | ICD-10-CM | POA: Diagnosis not present

## 2023-06-28 DIAGNOSIS — N186 End stage renal disease: Secondary | ICD-10-CM | POA: Diagnosis not present

## 2023-06-30 DIAGNOSIS — N186 End stage renal disease: Secondary | ICD-10-CM | POA: Diagnosis not present

## 2023-06-30 DIAGNOSIS — Z992 Dependence on renal dialysis: Secondary | ICD-10-CM | POA: Diagnosis not present

## 2023-07-01 ENCOUNTER — Inpatient Hospital Stay

## 2023-07-01 VITALS — BP 135/71 | HR 99 | Temp 98.3°F | Resp 18

## 2023-07-01 DIAGNOSIS — Z79624 Long term (current) use of inhibitors of nucleotide synthesis: Secondary | ICD-10-CM | POA: Diagnosis not present

## 2023-07-01 DIAGNOSIS — Z79811 Long term (current) use of aromatase inhibitors: Secondary | ICD-10-CM | POA: Diagnosis not present

## 2023-07-01 DIAGNOSIS — C9000 Multiple myeloma not having achieved remission: Secondary | ICD-10-CM

## 2023-07-01 DIAGNOSIS — Z1732 Human epidermal growth factor receptor 2 negative status: Secondary | ICD-10-CM | POA: Diagnosis not present

## 2023-07-01 DIAGNOSIS — Z5112 Encounter for antineoplastic immunotherapy: Secondary | ICD-10-CM | POA: Diagnosis not present

## 2023-07-01 DIAGNOSIS — Z7952 Long term (current) use of systemic steroids: Secondary | ICD-10-CM | POA: Diagnosis not present

## 2023-07-01 DIAGNOSIS — Z1721 Progesterone receptor positive status: Secondary | ICD-10-CM | POA: Diagnosis not present

## 2023-07-01 DIAGNOSIS — C50411 Malignant neoplasm of upper-outer quadrant of right female breast: Secondary | ICD-10-CM | POA: Diagnosis not present

## 2023-07-01 DIAGNOSIS — Z170 Estrogen receptor positive status [ER+]: Secondary | ICD-10-CM | POA: Diagnosis not present

## 2023-07-01 LAB — CBC WITH DIFFERENTIAL/PLATELET
Abs Immature Granulocytes: 0.01 10*3/uL (ref 0.00–0.07)
Basophils Absolute: 0 10*3/uL (ref 0.0–0.1)
Basophils Relative: 0 %
Eosinophils Absolute: 0.2 10*3/uL (ref 0.0–0.5)
Eosinophils Relative: 5 %
HCT: 35.2 % — ABNORMAL LOW (ref 36.0–46.0)
Hemoglobin: 11.2 g/dL — ABNORMAL LOW (ref 12.0–15.0)
Immature Granulocytes: 0 %
Lymphocytes Relative: 21 %
Lymphs Abs: 0.9 10*3/uL (ref 0.7–4.0)
MCH: 32.3 pg (ref 26.0–34.0)
MCHC: 31.8 g/dL (ref 30.0–36.0)
MCV: 101.4 fL — ABNORMAL HIGH (ref 80.0–100.0)
Monocytes Absolute: 0.5 10*3/uL (ref 0.1–1.0)
Monocytes Relative: 12 %
Neutro Abs: 2.6 10*3/uL (ref 1.7–7.7)
Neutrophils Relative %: 62 %
Platelets: 197 10*3/uL (ref 150–400)
RBC: 3.47 MIL/uL — ABNORMAL LOW (ref 3.87–5.11)
RDW: 17.7 % — ABNORMAL HIGH (ref 11.5–15.5)
WBC: 4.1 10*3/uL (ref 4.0–10.5)
nRBC: 0 % (ref 0.0–0.2)

## 2023-07-01 LAB — COMPREHENSIVE METABOLIC PANEL WITH GFR
ALT: 13 U/L (ref 0–44)
AST: 18 U/L (ref 15–41)
Albumin: 3.6 g/dL (ref 3.5–5.0)
Alkaline Phosphatase: 96 U/L (ref 38–126)
Anion gap: 14 (ref 5–15)
BUN: 22 mg/dL (ref 8–23)
CO2: 26 mmol/L (ref 22–32)
Calcium: 9.4 mg/dL (ref 8.9–10.3)
Chloride: 95 mmol/L — ABNORMAL LOW (ref 98–111)
Creatinine, Ser: 6.07 mg/dL — ABNORMAL HIGH (ref 0.44–1.00)
GFR, Estimated: 6 mL/min — ABNORMAL LOW (ref >60)
Glucose, Bld: 145 mg/dL — ABNORMAL HIGH (ref 70–99)
Potassium: 3.8 mmol/L (ref 3.5–5.1)
Sodium: 135 mmol/L (ref 135–145)
Total Bilirubin: 0.5 mg/dL (ref 0.0–1.2)
Total Protein: 7.2 g/dL (ref 6.5–8.1)

## 2023-07-01 LAB — MAGNESIUM: Magnesium: 2.4 mg/dL (ref 1.7–2.4)

## 2023-07-01 MED ORDER — BORTEZOMIB CHEMO SQ INJECTION 3.5 MG (2.5MG/ML)
1.2000 mg/m2 | Freq: Once | INTRAMUSCULAR | Status: AC
  Administered 2023-07-01: 2 mg via SUBCUTANEOUS
  Filled 2023-07-01: qty 0.8

## 2023-07-01 MED ORDER — PROCHLORPERAZINE MALEATE 10 MG PO TABS
10.0000 mg | ORAL_TABLET | Freq: Once | ORAL | Status: AC
  Administered 2023-07-01: 10 mg via ORAL
  Filled 2023-07-01: qty 1

## 2023-07-01 NOTE — Progress Notes (Signed)
Patient tolerated Velcade injection with no complaints voiced.  Lab work reviewed.  See MAR for details.  Injection site clean and dry with no bruising or swelling noted.  Patient stable during and after injection.  Band aid applied.  VSS.  Patient left in satisfactory condition with no s/s of distress noted. All follow ups as scheduled.  [employer]

## 2023-07-02 DIAGNOSIS — N186 End stage renal disease: Secondary | ICD-10-CM | POA: Diagnosis not present

## 2023-07-02 DIAGNOSIS — Z992 Dependence on renal dialysis: Secondary | ICD-10-CM | POA: Diagnosis not present

## 2023-07-05 ENCOUNTER — Encounter: Payer: Self-pay | Admitting: Adult Health

## 2023-07-05 ENCOUNTER — Non-Acute Institutional Stay (SKILLED_NURSING_FACILITY): Payer: Self-pay | Admitting: Adult Health

## 2023-07-05 DIAGNOSIS — Z992 Dependence on renal dialysis: Secondary | ICD-10-CM | POA: Diagnosis not present

## 2023-07-05 DIAGNOSIS — N186 End stage renal disease: Secondary | ICD-10-CM | POA: Diagnosis not present

## 2023-07-05 DIAGNOSIS — K219 Gastro-esophageal reflux disease without esophagitis: Secondary | ICD-10-CM | POA: Diagnosis not present

## 2023-07-05 DIAGNOSIS — E43 Unspecified severe protein-calorie malnutrition: Secondary | ICD-10-CM

## 2023-07-05 DIAGNOSIS — R52 Pain, unspecified: Secondary | ICD-10-CM

## 2023-07-05 DIAGNOSIS — G8929 Other chronic pain: Secondary | ICD-10-CM

## 2023-07-05 NOTE — Progress Notes (Signed)
 Location:  [facility] Room Number: [id number] Place of Service:  SNF (31)   CODE STATUS: dnr   Allergies  Allergen Reactions   Ace Inhibitors Other (See Comments) and Cough    Tongue swelling Angioedema    Angiotensin Receptor Blockers Other (See Comments)    Angioedema with ACE-I   Penicillins Other (See Comments)    Unknown reaction    Chief Complaint  Patient presents with   Medical Management of Chronic Issues       Protein calorie malnutrition: Gastroesophageal  reflux disease without esophagitis: Generalized chronic pain:     HPI:  She is a 88 y.o. long term resident of this facility being seen for the management of her chronic illnesses:Protein calorie malnutrition: Gastroesophageal  reflux disease without esophagitis: Generalized chronic pain: there are no reports of uncontrolled pain. She continues with hemodialysis three days per week. No significant changes in weight.    Past Medical History:  Diagnosis Date   Anemia     chronic macrocytic anemia   Anxiety    Chronic kidney disease    Chronic renal disease, stage 4, severely decreased glomerular filtration rate (GFR) between 15-29 mL/min/1.73 square meter (HCC) 08/22/2015   Complication of anesthesia    delirious after Breast Surgery   Dementia (HCC)    mild   Depression    Diabetes mellitus with ESRD (end-stage renal disease) (HCC)    type II   Dysphagia    Dyspnea    with activity   GERD (gastroesophageal reflux disease)    Glaucoma    Hyperlipidemia    Hypertension    Multiple myeloma (HCC)    Pneumonia    Stage 1 infiltrating ductal carcinoma of right female breast (HCC) 08/21/2015   ER+ PR+ HER 2 neu + (3+) T1cN0     Past Surgical History:  Procedure Laterality Date   A/V FISTULAGRAM Right 07/05/2020   Procedure: A/V Fistulagram;  Surgeon: Richrd Char, MD;  [facility];  Service: Cardiovascular;  Laterality: Right;   AV FISTULA PLACEMENT Left  11/22/2017   Procedure: ARTERIOVENOUS (AV) FISTULA CREATION LEFT ARM;  Surgeon: Richrd Char, MD;  Location: [facility] OR;  Service: Vascular;  Laterality: Left;   AV FISTULA PLACEMENT Right 04/04/2020   Procedure: RIGHT ARM ARTERIOVENOUS FISTULA CREATION;  Surgeon: Mayo Speck, MD;  Location: [facility] ORS;  Service: Vascular;  Laterality: Right;   AV FISTULA PLACEMENT Right 08/20/2020   Procedure: ARTERIOVENOUS (AV) FISTULA LIGATION RIGHT ARM;  Surgeon: Richrd Char, MD;  Location: [facility] OR;  Service: Vascular;  Laterality: Right;   BIOPSY  08/07/2016   Procedure: BIOPSY;  Surgeon: Suzette Espy, MD;  Location: [facility];  Service: Endoscopy;;  gastric ulcer biopsy   COLONOSCOPY     ESOPHAGOGASTRODUODENOSCOPY N/A 08/07/2016   LA Grade A esophagitis s/p dilation, small hiatal hernia, multiple gastric ulcers and erosions, duodenal erosions s/p biopsy. Negative H.pylori    ESOPHAGOGASTRODUODENOSCOPY N/A 11/27/2016   normal esophagus, previously noted gastric ulcers completely healed, normal duodenum.    ESOPHAGOGASTRODUODENOSCOPY (EGD) WITH PROPOFOL  N/A 07/23/2020   Procedure: ESOPHAGOGASTRODUODENOSCOPY (EGD) WITH PROPOFOL ;  Surgeon: Ruby Corporal, MD;  [facility]: [facility];  Service: Endoscopy;  Laterality: N/A;   FISTULA SUPERFICIALIZATION Left 02/14/2018   Procedure: FISTULA SUPERFICIALIZATION LEFT ARM;  Surgeon: Dannis Dy, MD;  Location: [facility] OR;  Service: Vascular;  Laterality: Left;   FRACTURE SURGERY Right    ankle   HOT HEMOSTASIS  07/23/2020   Procedure: HOT HEMOSTASIS (ARGON PLASMA COAGULATION/BICAP);  Surgeon: Ruby Corporal, MD;  [facility]: [facility];  Service: Endoscopy;;   INTRAMEDULLARY (IM) NAIL INTERTROCHANTERIC Right 07/12/2020   Procedure: INTRAMEDULLARY (IM) NAIL INTERTROCHANTRIC;  Surgeon: Tonita Frater, MD;  Location: [facility];  Service: Orthopedics;  Laterality: Right;   IR FLUORO GUIDE CV LINE LEFT  12/21/2022   IR US  GUIDE VASC ACCESS LEFT   12/21/2022   MALONEY DILATION N/A 08/07/2016   Procedure: Londa Rival DILATION;  Surgeon: Suzette Espy, MD;  [facility];  Service: Endoscopy;  Laterality: N/A;   MASTECTOMY, PARTIAL Right    PERIPHERAL VASCULAR BALLOON ANGIOPLASTY Left 07/13/2019   Procedure: PERIPHERAL VASCULAR BALLOON ANGIOPLASTY;  Surgeon: Young Hensen, MD;  [facility];  Service: Cardiovascular;  Laterality: Left;  arm fistulogram   PERIPHERAL VASCULAR BALLOON ANGIOPLASTY Right 05/22/2020   Procedure: PERIPHERAL VASCULAR BALLOON ANGIOPLASTY;  Surgeon: Carlene Che, MD;  [facility];  Service: Cardiovascular;  Laterality: Right;  arm fistula   PERIPHERAL VASCULAR BALLOON ANGIOPLASTY Right 07/05/2020   Procedure: PERIPHERAL VASCULAR BALLOON ANGIOPLASTY;  Surgeon: Richrd Char, MD;  [facility];  Service: Cardiovascular;  Laterality: Right;  arm fistula   PORT-A-CATH REMOVAL Left 11/22/2017   Procedure: REMOVAL PORT-A-CATH LEFT CHEST;  Surgeon: Richrd Char, MD;  Location: [facility] OR;  Service: Vascular;  Laterality: Left;   RETINAL DETACHMENT SURGERY Right    SCLEROTHERAPY  07/23/2020   Procedure: SCLEROTHERAPY;  Surgeon: Ruby Corporal, MD;  [facility]: [facility];  Service: Endoscopy;;   UPPER EXTREMITY VENOGRAPHY N/A 08/22/2020   Procedure: LEFT UPPER & CENTRAL VENOGRAPHY;  Surgeon: Young Hensen, MD;  [facility]: [facility];  Service: Cardiovascular;  Laterality: N/A;    Social History   Socioeconomic History   Marital status: Single    Spouse name: Not on file   Number of children: Not on file   Years of education: Not on file   Highest education level: Not on file  Occupational History   Occupation: retired   Tobacco Use   Smoking status: Never   Smokeless tobacco: Never  Vaping Use   Vaping status: Never Used  Substance and Sexual Activity   Alcohol  use: No    Alcohol /week: 0.0 standard drinks of alcohol     Drug use: No   Sexual activity: Never  Other Topics Concern   Not on file  Social History Narrative   Long term resident of SNF    Social Drivers of Health   Financial Resource Strain: Low Risk  (01/09/2020)   Overall Financial Resource Strain (CARDIA)    Difficulty of Paying Living Expenses: Not very hard  Food Insecurity: Patient Unable To Answer (05/24/2023)   Hunger Vital Sign    Worried About Running Out of Food in the Last Year: Patient unable to answer    Ran Out of Food in the Last Year: Patient unable to answer  Transportation Needs: Patient Unable To Answer (05/24/2023)   PRAPARE - Transportation    Lack of Transportation (Medical): Patient unable to answer    Lack of Transportation (Non-Medical): Patient unable to answer  Physical Activity: Inactive (01/09/2020)   Exercise Vital Sign    Days of Exercise per Week: 0 days    Minutes of Exercise per Session: 0 min  Stress: No Stress Concern Present (01/09/2020)   [employer] of Occupational Health - Occupational Stress Questionnaire  Feeling of Stress : Not at all  Social Connections: Patient Unable To Answer (05/24/2023)   Social Connection and Isolation Panel [NHANES]    Frequency of Communication with Friends and Family: Patient unable to answer    Frequency of Social Gatherings with Friends and Family: Patient unable to answer    Attends Religious Services: Patient unable to answer    Active Member of Clubs or Organizations: Patient unable to answer    Attends Banker Meetings: Patient unable to answer    Marital Status: Patient unable to answer  Intimate Partner Violence: Patient Unable To Answer (05/24/2023)   Humiliation, Afraid, Rape, and Kick questionnaire    Fear of Current or Ex-Partner: Patient unable to answer    Emotionally Abused: Patient unable to answer    Physically Abused: Patient unable to answer    Sexually Abused: Patient unable to answer   Family History  Problem Relation Age of  Onset   Multiple myeloma Sister    Brain cancer Sister    Dementia Mother        died at 54   Stroke Mother    Heart failure Mother    Diabetes Mother    Heart disease Father    Prostate cancer Brother    Colon cancer Neg Hx       VITAL SIGNS BP 138/75   Pulse 80   Temp (!) 97.5 F (36.4 C)   Resp 19   Ht 5\' 3"  (1.6 m)   Wt 146 lb (66.2 kg)   SpO2 99%   BMI 25.86 kg/m   Outpatient Encounter Medications as of 07/05/2023  Medication Sig   acetaminophen  (TYLENOL ) 325 MG tablet Take 650 mg by mouth 3 (three) times daily.   acyclovir  (ZOVIRAX ) 200 MG capsule Take 200 mg by mouth in the morning. (0800)   anastrozole  (ARIMIDEX ) 1 MG tablet TAKE 1 TABLET BY MOUTH DAILY   aspirin  EC 81 MG tablet Take 1 tablet (81 mg total) by mouth daily. Swallow whole.   atorvastatin  (LIPITOR) 40 MG tablet Take 1 tablet (40 mg total) by mouth daily.   Calcium  Carb-Cholecalciferol  (CALCIUM  + VITAMIN D3) 500-5 MG-MCG TABS Take 1 tablet by mouth daily.   melatonin 5 MG TABS Take 5 mg by mouth at bedtime.   multivitamin (RENA-VIT) TABS tablet Take 1 tablet by mouth at bedtime.   Nutritional Supplements (ENSURE ENLIVE PO) Take 237 mLs by mouth every evening.   ondansetron  (ZOFRAN -ODT) 4 MG disintegrating tablet Take 4 mg by mouth daily before breakfast.   pantoprazole  (PROTONIX ) 40 MG tablet Take 40 mg by mouth 2 (two) times daily.   predniSONE  (DELTASONE ) 50 MG tablet Take 1 tablet (50 mg total) by mouth daily with breakfast. (Patient not taking: Reported on 06/22/2023)   sennosides-docusate sodium (SENOKOT-S) 8.6-50 MG tablet Take 1 tablet by mouth 2 (two) times daily.   sertraline  (ZOLOFT ) 25 MG tablet Take 25 mg by mouth daily. Was taking 50 mg but it stopped on 05-25-2023   sevelamer  carbonate (RENVELA ) 800 MG tablet Take 800 mg by mouth daily.   sucralfate  (CARAFATE ) 1 GM/10ML suspension Take 10 mLs by mouth 2 (two) times daily.      SIGNIFICANT DIAGNOSTIC EXAMS  LABS REVIEWED PREVIOUS      06-23-22: wbc 4.9; hgb 9.1; hct 28.7 mcv 101.4 plt 286; glucose 158; bun 25; creat 5.85; k+ 3.9 na++ 136; ca 8.6 gfr 7 protein 6.8 albumin  3.3 mag 2.2 07-02-22: hgb A1c 5.1 07-07-22: wbc 4.9; hgb  10.3; hct 32.8; mcv 102.5 plt 265; glucose 141; bun 32; creat 6.15; k+ 4.3; na++ 136; ca 8.9; gfr 6 protein 7.2 albumin  3.7; mag 2.3 07-20-22: vitamin B 12: 660; tsh 1.836 08-04-22: wbc 4.6; hgb 10.7; ht 33.1; mcv 100.3 plt 234; glucose 120; bun 41; creat 6.46; k+ 4.2; na++ 135; ca 9.8; gfr 6; protein 7.3; albumin  3.7 mag 2.4 09-15-22: wbc 5.2; hgb 9.9; hct 31.0; mcv 103.3 plt 224; glucose 132; bun 22; creat 6.19; k+ 4.2; na++ 135; ca 9.5 gfr 6 protein 6.9 albumin  3.7     11-10-22: wbc 4.0; hgb 10.7; hct 33.4; mcv 102.1 plt 187; glucose 110; bun 28; creat 6.16; k+ 4.8; na++ 130; ca 9.0; gfr 6; protein 6.8 albumin  3.6 mag 2.4  12-16-22: wbc 6.7; hgb 12.4; hct 38.3; mcv 101.3 plt 201; glucose 123; bun 22; creat 4.81; k+ 4.1; na++ 133; ca 9.4; gfr 8; protein 7.9; albumin  4.1; blood culture: staphylococcus hominis  12-22-22: wbc 4.2; hgb 9.8; hct 30.1; mcv 100.0; plt 184; glucose 90; bun 81; creat 12.02; k+ 5.0; na++ 136; ca 8.8; gfr 3; phos 10.8; albumin  3.2 02-16-23: wbc 4.9; hgb 9.2; hct 28.8; mcv 104.7 plt 215 glucose 194; bun 32; creat 8.17; k+ 5.1; na++ 9.8;gfr 4; protein 7.5 albumin  3.9 mag 2.6    TODAY  05-27-23: uric acid 3.4 07-01-23: wbc 4.1; hgb 11.2; hct 35.2; mcv 101.4 plt 197;glucose 145; bun 22; creat 6.07; k+ 3.8; na++ 135; ca 9.4 gfr 6 protein 7.2 albumin  3.6 mag 2.4   Review of Systems  Constitutional:  Negative for malaise/fatigue.  Respiratory:  Negative for cough and shortness of breath.   Cardiovascular:  Negative for chest pain, palpitations and leg swelling.  Gastrointestinal:  Negative for abdominal pain, constipation and heartburn.  Musculoskeletal:  Negative for back pain, joint pain and myalgias.  Skin: Negative.   Neurological:  Negative for dizziness.  Psychiatric/Behavioral:  The  patient is not nervous/anxious.    Physical Exam Constitutional:      General: She is not in acute distress.    Appearance: She is well-developed. She is not diaphoretic.  Neck:     Thyroid : No thyromegaly.  Cardiovascular:     Rate and Rhythm: Normal rate and regular rhythm.     Pulses: Normal pulses.     Heart sounds: Normal heart sounds.  Pulmonary:     Effort: Pulmonary effort is normal. No respiratory distress.     Breath sounds: Normal breath sounds.  Abdominal:     General: Bowel sounds are normal. There is no distension.     Palpations: Abdomen is soft.     Tenderness: There is no abdominal tenderness.  Musculoskeletal:        General: Normal range of motion.     Cervical back: Neck supple.     Right lower leg: No edema.     Left lower leg: No edema.  Lymphadenopathy:     Cervical: No cervical adenopathy.  Skin:    General: Skin is warm and dry.     Comments: Tunneled dialysis access   chest wall     Neurological:     Mental Status: She is alert. Mental status is at baseline.  Psychiatric:        Mood and Affect: Mood normal.   ASSESSMENT/PLAN  TODAY  Protein calorie malnutrition: protein 7.2; albumin  3.6; will continue supplements as directed  2. Gastroesophageal  reflux disease without esophagitis: will continue protonix  40 mg twice daily carafate  1 gm four  times daily; zofran  4 mg prior to breakfast  3. Generalized chronic pain: will continue tylenol  650 mg three times daily   PREVIOUS    4. Chronic kidney disease with end stage renal disease on dialysis due to type 2 diabetes mellitus/dependence on dialysis: will continue dialysis three days per week: 1200 cc fluid restriction; renvela  800 mg daily   5. Hypertension associated with end stage renal disease (ESRD) due to type 2 diabetes mellitus: b/p 138/75 will monitor   6. Hyperlipidemia associated with type 2 diabetes mellitus: ldl 89; is off lipitor; had elevated liver enzymes.   7. Aortic  atherosclerosis/atherosclerotic peripheral vascular disease (ct 10-04-18) ldl 89  8. Vascular dementia with behavioral disturbance: weight is 142 pounds and stable.   9. Diabetes mellitus type 2 with end stage renal disease (ESRD) hgb A1c 5.1 is currently off medications  10. Anemia due to end stage renal disease; hgb 11.2  11. Post menopausal osteoporosis: t score -2.957 will continue supplements   12. Stage 1 infiltrating ductal carcinoma of female right breast / s/p right mastectomy will continue arimidex  1 mg daily   13. Multiple myeloma not having achieved remission: is followed by oncology  14. Herpes: without outbreak will continue acyclovir  200 mg daily   15. Major depression chronic recurrent: will continue zoloft  25 mg daily    Britt Candle NP [facility]  call [phone]

## 2023-07-07 DIAGNOSIS — Z992 Dependence on renal dialysis: Secondary | ICD-10-CM | POA: Diagnosis not present

## 2023-07-07 DIAGNOSIS — N186 End stage renal disease: Secondary | ICD-10-CM | POA: Diagnosis not present

## 2023-07-08 ENCOUNTER — Inpatient Hospital Stay

## 2023-07-08 ENCOUNTER — Inpatient Hospital Stay: Attending: Hematology

## 2023-07-08 VITALS — BP 138/78 | HR 88 | Temp 99.2°F | Resp 18

## 2023-07-08 DIAGNOSIS — Z5112 Encounter for antineoplastic immunotherapy: Secondary | ICD-10-CM | POA: Insufficient documentation

## 2023-07-08 DIAGNOSIS — N186 End stage renal disease: Secondary | ICD-10-CM | POA: Diagnosis not present

## 2023-07-08 DIAGNOSIS — C9000 Multiple myeloma not having achieved remission: Secondary | ICD-10-CM | POA: Diagnosis not present

## 2023-07-08 DIAGNOSIS — Z992 Dependence on renal dialysis: Secondary | ICD-10-CM | POA: Diagnosis not present

## 2023-07-08 LAB — COMPREHENSIVE METABOLIC PANEL WITH GFR
ALT: 13 U/L (ref 0–44)
AST: 20 U/L (ref 15–41)
Albumin: 3.9 g/dL (ref 3.5–5.0)
Alkaline Phosphatase: 96 U/L (ref 38–126)
Anion gap: 15 (ref 5–15)
BUN: 22 mg/dL (ref 8–23)
CO2: 26 mmol/L (ref 22–32)
Calcium: 9.8 mg/dL (ref 8.9–10.3)
Chloride: 95 mmol/L — ABNORMAL LOW (ref 98–111)
Creatinine, Ser: 6.14 mg/dL — ABNORMAL HIGH (ref 0.44–1.00)
GFR, Estimated: 6 mL/min — ABNORMAL LOW (ref >60)
Glucose, Bld: 94 mg/dL (ref 70–99)
Potassium: 3.9 mmol/L (ref 3.5–5.1)
Sodium: 136 mmol/L (ref 135–145)
Total Bilirubin: 0.3 mg/dL (ref 0.0–1.2)
Total Protein: 7.2 g/dL (ref 6.5–8.1)

## 2023-07-08 LAB — CBC WITH DIFFERENTIAL/PLATELET
Abs Immature Granulocytes: 0.01 10*3/uL (ref 0.00–0.07)
Basophils Absolute: 0 10*3/uL (ref 0.0–0.1)
Basophils Relative: 1 %
Eosinophils Absolute: 0.2 10*3/uL (ref 0.0–0.5)
Eosinophils Relative: 4 %
HCT: 38 % (ref 36.0–46.0)
Hemoglobin: 11.8 g/dL — ABNORMAL LOW (ref 12.0–15.0)
Immature Granulocytes: 0 %
Lymphocytes Relative: 20 %
Lymphs Abs: 0.8 10*3/uL (ref 0.7–4.0)
MCH: 31.6 pg (ref 26.0–34.0)
MCHC: 31.1 g/dL (ref 30.0–36.0)
MCV: 101.6 fL — ABNORMAL HIGH (ref 80.0–100.0)
Monocytes Absolute: 0.5 10*3/uL (ref 0.1–1.0)
Monocytes Relative: 13 %
Neutro Abs: 2.3 10*3/uL (ref 1.7–7.7)
Neutrophils Relative %: 62 %
Platelets: 197 10*3/uL (ref 150–400)
RBC: 3.74 MIL/uL — ABNORMAL LOW (ref 3.87–5.11)
RDW: 17.4 % — ABNORMAL HIGH (ref 11.5–15.5)
WBC: 3.8 10*3/uL — ABNORMAL LOW (ref 4.0–10.5)
nRBC: 0 % (ref 0.0–0.2)

## 2023-07-08 LAB — MAGNESIUM: Magnesium: 2.4 mg/dL (ref 1.7–2.4)

## 2023-07-08 MED ORDER — BORTEZOMIB CHEMO SQ INJECTION 3.5 MG (2.5MG/ML)
1.2000 mg/m2 | Freq: Once | INTRAMUSCULAR | Status: AC
  Administered 2023-07-08: 2 mg via SUBCUTANEOUS
  Filled 2023-07-08: qty 0.8

## 2023-07-08 MED ORDER — PROCHLORPERAZINE MALEATE 10 MG PO TABS
10.0000 mg | ORAL_TABLET | Freq: Once | ORAL | Status: AC
  Administered 2023-07-08: 10 mg via ORAL
  Filled 2023-07-08: qty 1

## 2023-07-08 NOTE — Patient Instructions (Signed)
 [facility] [place] - [facility]  Discharge Instructions: Thank you for choosing [facility] to provide your oncology and hematology care.  If you have a lab appointment with the Cancer Center - please note that after April 8th, 2024, all labs will be drawn in the cancer center.  You do not have to check in or register with the main entrance as you have in the past but will complete your check-in in the cancer center.  Wear comfortable clothing and clothing appropriate for easy access to any Portacath or PICC line.   We strive to give you quality time with your provider. You may need to reschedule your appointment if you arrive late (15 or more minutes).  Arriving late affects you and other patients whose appointments are after yours.  Also, if you miss three or more appointments without notifying the office, you may be dismissed from the clinic at the provider's discretion.      For prescription refill requests, have your pharmacy contact our office and allow 72 hours for refills to be completed.    Today you received the following chemotherapy and/or immunotherapy agents Velcade      To help prevent nausea and vomiting after your treatment, we encourage you to take your nausea medication as directed.  BELOW ARE SYMPTOMS THAT SHOULD BE REPORTED IMMEDIATELY: *FEVER GREATER THAN 100.4 F (38 C) OR HIGHER *CHILLS OR SWEATING *NAUSEA AND VOMITING THAT IS NOT CONTROLLED WITH YOUR NAUSEA MEDICATION *UNUSUAL SHORTNESS OF BREATH *UNUSUAL BRUISING OR BLEEDING *URINARY PROBLEMS (pain or burning when urinating, or frequent urination) *BOWEL PROBLEMS (unusual diarrhea, constipation, pain near the anus) TENDERNESS IN MOUTH AND THROAT WITH OR WITHOUT PRESENCE OF ULCERS (sore throat, sores in mouth, or a toothache) UNUSUAL RASH, SWELLING OR PAIN  UNUSUAL VAGINAL DISCHARGE OR ITCHING   Items with * indicate a potential emergency and should be followed up  as soon as possible or go to the Emergency Department if any problems should occur.  Please show the CHEMOTHERAPY ALERT CARD or IMMUNOTHERAPY ALERT CARD at check-in to the Emergency Department and triage nurse.  Should you have questions after your visit or need to cancel or reschedule your appointment, please contact [facility] [place] - A DEPT OF [facility] [phone]  and follow the prompts.  Office hours are 8:00 a.m. to 4:30 p.m. Monday - Friday. Please note that voicemails left after 4:00 p.m. may not be returned until the following business day.  We are closed weekends and major holidays. You have access to a nurse at all times for urgent questions. Please call the main number to the clinic [phone] and follow the prompts.  For any non-urgent questions, you may also contact your provider using MyChart. We now offer e-Visits for anyone 44 and older to request care online for non-urgent symptoms. For details visit [email or web address].   Also download the MyChart app! Go to the app store, search "MyChart", open the app, select [place], and log in with your MyChart username and password.

## 2023-07-08 NOTE — Progress Notes (Signed)
 Patient presents today for Velcade  injection per providers order.  Vital signs and labs within parameters for injection.  No new complaints at this time.  Stable during administration without incident; injection site WNL; see MAR for injection details.  Patient tolerated procedure well and without incident.  No questions or complaints noted at this time.

## 2023-07-09 DIAGNOSIS — N186 End stage renal disease: Secondary | ICD-10-CM | POA: Diagnosis not present

## 2023-07-09 DIAGNOSIS — Z992 Dependence on renal dialysis: Secondary | ICD-10-CM | POA: Diagnosis not present

## 2023-07-12 DIAGNOSIS — Z992 Dependence on renal dialysis: Secondary | ICD-10-CM | POA: Diagnosis not present

## 2023-07-12 DIAGNOSIS — N186 End stage renal disease: Secondary | ICD-10-CM | POA: Diagnosis not present

## 2023-07-15 ENCOUNTER — Inpatient Hospital Stay

## 2023-07-15 VITALS — BP 150/80 | HR 81 | Temp 98.8°F | Resp 20 | Wt 157.8 lb

## 2023-07-15 DIAGNOSIS — C9000 Multiple myeloma not having achieved remission: Secondary | ICD-10-CM

## 2023-07-15 DIAGNOSIS — Z5112 Encounter for antineoplastic immunotherapy: Secondary | ICD-10-CM | POA: Diagnosis not present

## 2023-07-15 LAB — CBC WITH DIFFERENTIAL/PLATELET
Abs Immature Granulocytes: 0.01 10*3/uL (ref 0.00–0.07)
Basophils Absolute: 0 10*3/uL (ref 0.0–0.1)
Basophils Relative: 1 %
Eosinophils Absolute: 0.2 10*3/uL (ref 0.0–0.5)
Eosinophils Relative: 5 %
HCT: 38.9 % (ref 36.0–46.0)
Hemoglobin: 12.3 g/dL (ref 12.0–15.0)
Immature Granulocytes: 0 %
Lymphocytes Relative: 18 %
Lymphs Abs: 0.9 10*3/uL (ref 0.7–4.0)
MCH: 32.3 pg (ref 26.0–34.0)
MCHC: 31.6 g/dL (ref 30.0–36.0)
MCV: 102.1 fL — ABNORMAL HIGH (ref 80.0–100.0)
Monocytes Absolute: 0.5 10*3/uL (ref 0.1–1.0)
Monocytes Relative: 11 %
Neutro Abs: 3.1 10*3/uL (ref 1.7–7.7)
Neutrophils Relative %: 65 %
Platelets: 203 10*3/uL (ref 150–400)
RBC: 3.81 MIL/uL — ABNORMAL LOW (ref 3.87–5.11)
RDW: 17.1 % — ABNORMAL HIGH (ref 11.5–15.5)
WBC: 4.7 10*3/uL (ref 4.0–10.5)
nRBC: 0 % (ref 0.0–0.2)

## 2023-07-15 LAB — COMPREHENSIVE METABOLIC PANEL WITH GFR
ALT: 12 U/L (ref 0–44)
AST: 17 U/L (ref 15–41)
Albumin: 3.9 g/dL (ref 3.5–5.0)
Alkaline Phosphatase: 102 U/L (ref 38–126)
Anion gap: 15 (ref 5–15)
BUN: 46 mg/dL — ABNORMAL HIGH (ref 8–23)
CO2: 25 mmol/L (ref 22–32)
Calcium: 9.6 mg/dL (ref 8.9–10.3)
Chloride: 92 mmol/L — ABNORMAL LOW (ref 98–111)
Creatinine, Ser: 9.68 mg/dL — ABNORMAL HIGH (ref 0.44–1.00)
GFR, Estimated: 4 mL/min — ABNORMAL LOW (ref >60)
Glucose, Bld: 128 mg/dL — ABNORMAL HIGH (ref 70–99)
Potassium: 5 mmol/L (ref 3.5–5.1)
Sodium: 132 mmol/L — ABNORMAL LOW (ref 135–145)
Total Bilirubin: 0.5 mg/dL (ref 0.0–1.2)
Total Protein: 7.4 g/dL (ref 6.5–8.1)

## 2023-07-15 LAB — MAGNESIUM: Magnesium: 2.7 mg/dL — ABNORMAL HIGH (ref 1.7–2.4)

## 2023-07-15 MED ORDER — PROCHLORPERAZINE MALEATE 10 MG PO TABS
10.0000 mg | ORAL_TABLET | Freq: Once | ORAL | Status: AC
  Administered 2023-07-15: 10 mg via ORAL
  Filled 2023-07-15: qty 1

## 2023-07-15 MED ORDER — BORTEZOMIB CHEMO SQ INJECTION 3.5 MG (2.5MG/ML)
1.2000 mg/m2 | Freq: Once | INTRAMUSCULAR | Status: AC
  Administered 2023-07-15: 2 mg via SUBCUTANEOUS
  Filled 2023-07-15: qty 0.8

## 2023-07-15 NOTE — Progress Notes (Signed)
Patient presents today for Velcade injection per providers order.  Vital signs and labs within parameters for treatment.  Patient has no new complaints at this time.  Stable during administration without incident; injection site WNL; see MAR for injection details.  Patient tolerated procedure well and without incident.  No questions or complaints noted at this time.

## 2023-07-15 NOTE — Patient Instructions (Signed)
 [facility] [place] - [facility]  Discharge Instructions: Thank you for choosing [facility] to provide your oncology and hematology care.  If you have a lab appointment with the Cancer Center - please note that after April 8th, 2024, all labs will be drawn in the cancer center.  You do not have to check in or register with the main entrance as you have in the past but will complete your check-in in the cancer center.  Wear comfortable clothing and clothing appropriate for easy access to any Portacath or PICC line.   We strive to give you quality time with your provider. You may need to reschedule your appointment if you arrive late (15 or more minutes).  Arriving late affects you and other patients whose appointments are after yours.  Also, if you miss three or more appointments without notifying the office, you may be dismissed from the clinic at the provider's discretion.      For prescription refill requests, have your pharmacy contact our office and allow 72 hours for refills to be completed.    Today you received the following chemotherapy and/or immunotherapy agents Velcade      To help prevent nausea and vomiting after your treatment, we encourage you to take your nausea medication as directed.  BELOW ARE SYMPTOMS THAT SHOULD BE REPORTED IMMEDIATELY: *FEVER GREATER THAN 100.4 F (38 C) OR HIGHER *CHILLS OR SWEATING *NAUSEA AND VOMITING THAT IS NOT CONTROLLED WITH YOUR NAUSEA MEDICATION *UNUSUAL SHORTNESS OF BREATH *UNUSUAL BRUISING OR BLEEDING *URINARY PROBLEMS (pain or burning when urinating, or frequent urination) *BOWEL PROBLEMS (unusual diarrhea, constipation, pain near the anus) TENDERNESS IN MOUTH AND THROAT WITH OR WITHOUT PRESENCE OF ULCERS (sore throat, sores in mouth, or a toothache) UNUSUAL RASH, SWELLING OR PAIN  UNUSUAL VAGINAL DISCHARGE OR ITCHING   Items with * indicate a potential emergency and should be followed up  as soon as possible or go to the Emergency Department if any problems should occur.  Please show the CHEMOTHERAPY ALERT CARD or IMMUNOTHERAPY ALERT CARD at check-in to the Emergency Department and triage nurse.  Should you have questions after your visit or need to cancel or reschedule your appointment, please contact [facility] [place] - A DEPT OF [facility] [phone]  and follow the prompts.  Office hours are 8:00 a.m. to 4:30 p.m. Monday - Friday. Please note that voicemails left after 4:00 p.m. may not be returned until the following business day.  We are closed weekends and major holidays. You have access to a nurse at all times for urgent questions. Please call the main number to the clinic [phone] and follow the prompts.  For any non-urgent questions, you may also contact your provider using MyChart. We now offer e-Visits for anyone 44 and older to request care online for non-urgent symptoms. For details visit [email or web address].   Also download the MyChart app! Go to the app store, search "MyChart", open the app, select [place], and log in with your MyChart username and password.

## 2023-07-16 DIAGNOSIS — Z992 Dependence on renal dialysis: Secondary | ICD-10-CM | POA: Diagnosis not present

## 2023-07-16 DIAGNOSIS — N186 End stage renal disease: Secondary | ICD-10-CM | POA: Diagnosis not present

## 2023-07-19 DIAGNOSIS — N186 End stage renal disease: Secondary | ICD-10-CM | POA: Diagnosis not present

## 2023-07-19 DIAGNOSIS — Z992 Dependence on renal dialysis: Secondary | ICD-10-CM | POA: Diagnosis not present

## 2023-07-21 DIAGNOSIS — Z992 Dependence on renal dialysis: Secondary | ICD-10-CM | POA: Diagnosis not present

## 2023-07-21 DIAGNOSIS — N186 End stage renal disease: Secondary | ICD-10-CM | POA: Diagnosis not present

## 2023-07-22 ENCOUNTER — Inpatient Hospital Stay

## 2023-07-22 VITALS — BP 138/75 | HR 91 | Temp 97.2°F | Resp 20

## 2023-07-22 DIAGNOSIS — C9000 Multiple myeloma not having achieved remission: Secondary | ICD-10-CM

## 2023-07-22 DIAGNOSIS — Z5112 Encounter for antineoplastic immunotherapy: Secondary | ICD-10-CM | POA: Diagnosis not present

## 2023-07-22 LAB — COMPREHENSIVE METABOLIC PANEL WITH GFR
ALT: 13 U/L (ref 0–44)
AST: 19 U/L (ref 15–41)
Albumin: 3.8 g/dL (ref 3.5–5.0)
Alkaline Phosphatase: 92 U/L (ref 38–126)
Anion gap: 17 — ABNORMAL HIGH (ref 5–15)
BUN: 25 mg/dL — ABNORMAL HIGH (ref 8–23)
CO2: 24 mmol/L (ref 22–32)
Calcium: 9.3 mg/dL (ref 8.9–10.3)
Chloride: 90 mmol/L — ABNORMAL LOW (ref 98–111)
Creatinine, Ser: 6.59 mg/dL — ABNORMAL HIGH (ref 0.44–1.00)
GFR, Estimated: 6 mL/min — ABNORMAL LOW (ref >60)
Glucose, Bld: 140 mg/dL — ABNORMAL HIGH (ref 70–99)
Potassium: 4.6 mmol/L (ref 3.5–5.1)
Sodium: 131 mmol/L — ABNORMAL LOW (ref 135–145)
Total Bilirubin: 0.4 mg/dL (ref 0.0–1.2)
Total Protein: 7.5 g/dL (ref 6.5–8.1)

## 2023-07-22 LAB — CBC WITH DIFFERENTIAL/PLATELET
Abs Immature Granulocytes: 0.01 10*3/uL (ref 0.00–0.07)
Basophils Absolute: 0 10*3/uL (ref 0.0–0.1)
Basophils Relative: 1 %
Eosinophils Absolute: 0.2 10*3/uL (ref 0.0–0.5)
Eosinophils Relative: 5 %
HCT: 37.6 % (ref 36.0–46.0)
Hemoglobin: 12.2 g/dL (ref 12.0–15.0)
Immature Granulocytes: 0 %
Lymphocytes Relative: 19 %
Lymphs Abs: 0.8 10*3/uL (ref 0.7–4.0)
MCH: 32.7 pg (ref 26.0–34.0)
MCHC: 32.4 g/dL (ref 30.0–36.0)
MCV: 100.8 fL — ABNORMAL HIGH (ref 80.0–100.0)
Monocytes Absolute: 0.6 10*3/uL (ref 0.1–1.0)
Monocytes Relative: 13 %
Neutro Abs: 2.6 10*3/uL (ref 1.7–7.7)
Neutrophils Relative %: 62 %
Platelets: 194 10*3/uL (ref 150–400)
RBC: 3.73 MIL/uL — ABNORMAL LOW (ref 3.87–5.11)
RDW: 17.2 % — ABNORMAL HIGH (ref 11.5–15.5)
WBC: 4.2 10*3/uL (ref 4.0–10.5)
nRBC: 0 % (ref 0.0–0.2)

## 2023-07-22 LAB — MAGNESIUM: Magnesium: 2.4 mg/dL (ref 1.7–2.4)

## 2023-07-22 MED ORDER — BORTEZOMIB CHEMO SQ INJECTION 3.5 MG (2.5MG/ML)
1.2000 mg/m2 | Freq: Once | INTRAMUSCULAR | Status: AC
  Administered 2023-07-22: 2 mg via SUBCUTANEOUS
  Filled 2023-07-22: qty 0.8

## 2023-07-22 MED ORDER — PROCHLORPERAZINE MALEATE 10 MG PO TABS
10.0000 mg | ORAL_TABLET | Freq: Once | ORAL | Status: AC
  Administered 2023-07-22: 10 mg via ORAL
  Filled 2023-07-22: qty 1

## 2023-07-22 NOTE — Progress Notes (Signed)
 Patient presents today for Velcade infusion. Patient is in satisfactory condition with no new complaints voiced.  Vital signs are stable.  Labs reviewed and all labs are within treatment parameters.  We will proceed with treatment per MD orders.    Treatment given today per MD orders. Tolerated infusion without adverse affects. Vital signs stable. No complaints at this time. Discharged from clinic via wheelchair in stable condition. Alert and oriented x 3. F/U with [facility] as scheduled.

## 2023-07-22 NOTE — Patient Instructions (Signed)
 [facility] [place] - A DEPT OF [facility]  Discharge Instructions: Thank you for choosing [facility] to provide your oncology and hematology care.  If you have a lab appointment with the Cancer Center - please note that after April 8th, 2024, all labs will be drawn in the cancer center.  You do not have to check in or register with the main entrance as you have in the past but will complete your check-in in the cancer center.  Wear comfortable clothing and clothing appropriate for easy access to any Portacath or PICC line.   We strive to give you quality time with your provider. You may need to reschedule your appointment if you arrive late (15 or more minutes).  Arriving late affects you and other patients whose appointments are after yours.  Also, if you miss three or more appointments without notifying the office, you may be dismissed from the clinic at the provider's discretion.      For prescription refill requests, have your pharmacy contact our office and allow 72 hours for refills to be completed.    Today you received the following chemotherapy and/or immunotherapy agents Velcade   To help prevent nausea and vomiting after your treatment, we encourage you to take your nausea medication as directed.  Bortezomib Injection What is this medication? BORTEZOMIB (bor TEZ oh mib) treats lymphoma. It may also be used to treat multiple myeloma, a type of bone marrow cancer. It works by blocking a protein that causes cancer cells to grow and multiply. This helps to slow or stop the spread of cancer cells. This medicine may be used for other purposes; ask your health care provider or pharmacist if you have questions. COMMON BRAND NAME(S): BORUZU, Velcade What should I tell my care team before I take this medication? They need to know if you have any of these conditions: Dehydration Diabetes Heart disease Liver disease Tingling of the fingers or toes or  other nerve disorder An unusual or allergic reaction to bortezomib, other medications, foods, dyes, or preservatives If you or your partner are pregnant or trying to get pregnant Breastfeeding How should I use this medication? This medication is injected into a vein or under the skin. It is given by your care team in a hospital or clinic setting. Talk to your care team about the use of this medication in children. Special care may be needed. Overdosage: If you think you have taken too much of this medicine contact a poison control center or emergency room at once. NOTE: This medicine is only for you. Do not share this medicine with others. What if I miss a dose? Keep appointments for follow-up doses. It is important not to miss your dose. Call your care team if you are unable to keep an appointment. What may interact with this medication? Ketoconazole Rifampin This list may not describe all possible interactions. Give your health care provider a list of all the medicines, herbs, non-prescription drugs, or dietary supplements you use. Also tell them if you smoke, drink alcohol, or use illegal drugs. Some items may interact with your medicine. What should I watch for while using this medication? Your condition will be monitored carefully while you are receiving this medication. You may need blood work while taking this medication. This medication may affect your coordination, reaction time, or judgment. Do not drive or operate machinery until you know how this medication affects you. Sit up or stand slowly to  reduce the risk of dizzy or fainting spells. Drinking alcohol with this medication can increase the risk of these side effects. This medication may increase your risk of getting an infection. Call your care team for advice if you get a fever, chills, sore throat, or other symptoms of a cold or flu. Do not treat yourself. Try to avoid being around people who are sick. Check with your care team  if you have severe diarrhea, nausea, and vomiting, or if you sweat a lot. The loss of too much body fluid may make it dangerous for you to take this medication. Talk to your care team if you may be pregnant. Serious birth defects can occur if you take this medication during pregnancy and for 7 months after the last dose. You will need a negative pregnancy test before starting this medication. Contraception is recommended while taking this medication and for 7 months after the last dose. Your care team can help you find the option that works for you. If your partner can get pregnant, use a condom during sex while taking this medication and for 4 months after the last dose. Do not breastfeed while taking this medication and for 2 months after the last dose. This medication may cause infertility. Talk to your care team if you are concerned about your fertility. What side effects may I notice from receiving this medication? Side effects that you should report to your care team as soon as possible: Allergic reactions--skin rash, itching, hives, swelling of the face, lips, tongue, or throat Bleeding--bloody or black, tar-like stools, vomiting blood or brown material that looks like coffee grounds, red or dark brown urine, small red or purple spots on skin, unusual bruising or bleeding Bleeding in the brain--severe headache, stiff neck, confusion, dizziness, change in vision, numbness or weakness of the face, arm, or leg, trouble speaking, trouble walking, vomiting Bowel blockage--stomach cramping, unable to have a bowel movement or pass gas, loss of appetite, vomiting Heart failure--shortness of breath, swelling of the ankles, feet, or hands, sudden weight gain, unusual weakness or fatigue Infection--fever, chills, cough, sore throat, wounds that don't heal, pain or trouble when passing urine, general feeling of discomfort or being unwell Liver injury--right upper belly pain, loss of appetite, nausea,  light-colored stool, dark yellow or brown urine, yellowing skin or eyes, unusual weakness or fatigue Low blood pressure--dizziness, feeling faint or lightheaded, blurry vision Lung injury--shortness of breath or trouble breathing, cough, spitting up blood, chest pain, fever Pain, tingling, or numbness in the hands or feet Severe or prolonged diarrhea Stomach pain, bloody diarrhea, pale skin, unusual weakness or fatigue, decrease in the amount of urine, which may be signs of hemolytic uremic syndrome Sudden and severe headache, confusion, change in vision, seizures, which may be signs of posterior reversible encephalopathy syndrome (PRES) TTP--purple spots on the skin or inside the mouth, pale skin, yellowing skin or eyes, unusual weakness or fatigue, fever, fast or irregular heartbeat, confusion, change in vision, trouble speaking, trouble walking Tumor lysis syndrome (TLS)--nausea, vomiting, diarrhea, decrease in the amount of urine, dark urine, unusual weakness or fatigue, confusion, muscle pain or cramps, fast or irregular heartbeat, joint pain Side effects that usually do not require medical attention (report to your care team if they continue or are bothersome): Constipation Diarrhea Fatigue Loss of appetite Nausea This list may not describe all possible side effects. Call your doctor for medical advice about side effects. You may report side effects to FDA at 1-800-FDA-1088. Where should I keep  my medication? This medication is given in a hospital or clinic. It will not be stored at home. NOTE: This sheet is a summary. It may not cover all possible information. If you have questions about this medicine, talk to your doctor, pharmacist, or health care provider.  2024 Elsevier/Gold Standard (2021-07-29 00:00:00)  BELOW ARE SYMPTOMS THAT SHOULD BE REPORTED IMMEDIATELY: *FEVER GREATER THAN 100.4 F (38 C) OR HIGHER *CHILLS OR SWEATING *NAUSEA AND VOMITING THAT IS NOT CONTROLLED WITH YOUR  NAUSEA MEDICATION *UNUSUAL SHORTNESS OF BREATH *UNUSUAL BRUISING OR BLEEDING *URINARY PROBLEMS (pain or burning when urinating, or frequent urination) *BOWEL PROBLEMS (unusual diarrhea, constipation, pain near the anus) TENDERNESS IN MOUTH AND THROAT WITH OR WITHOUT PRESENCE OF ULCERS (sore throat, sores in mouth, or a toothache) UNUSUAL RASH, SWELLING OR PAIN  UNUSUAL VAGINAL DISCHARGE OR ITCHING   Items with * indicate a potential emergency and should be followed up as soon as possible or go to the Emergency Department if any problems should occur.  Please show the CHEMOTHERAPY ALERT CARD or IMMUNOTHERAPY ALERT CARD at check-in to the Emergency Department and triage nurse.  Should you have questions after your visit or need to cancel or reschedule your appointment, please contact [facility] [place] - A DEPT OF [facility] [phone]  and follow the prompts.  Office hours are 8:00 a.m. to 4:30 p.m. Monday - Friday. Please note that voicemails left after 4:00 p.m. may not be returned until the following business day.  We are closed weekends and major holidays. You have access to a nurse at all times for urgent questions. Please call the main number to the clinic [phone] and follow the prompts.  For any non-urgent questions, you may also contact your provider using MyChart. We now offer e-Visits for anyone 40 and older to request care online for non-urgent symptoms. For details visit [email or web address].   Also download the MyChart app! Go to the app store, search "MyChart", open the app, select [place], and log in with your MyChart username and password.

## 2023-07-23 DIAGNOSIS — Z992 Dependence on renal dialysis: Secondary | ICD-10-CM | POA: Diagnosis not present

## 2023-07-23 DIAGNOSIS — N186 End stage renal disease: Secondary | ICD-10-CM | POA: Diagnosis not present

## 2023-07-24 ENCOUNTER — Other Ambulatory Visit: Payer: Self-pay

## 2023-07-26 DIAGNOSIS — Z992 Dependence on renal dialysis: Secondary | ICD-10-CM | POA: Diagnosis not present

## 2023-07-26 DIAGNOSIS — N186 End stage renal disease: Secondary | ICD-10-CM | POA: Diagnosis not present

## 2023-07-28 DIAGNOSIS — N186 End stage renal disease: Secondary | ICD-10-CM | POA: Diagnosis not present

## 2023-07-28 DIAGNOSIS — Z992 Dependence on renal dialysis: Secondary | ICD-10-CM | POA: Diagnosis not present

## 2023-07-29 ENCOUNTER — Inpatient Hospital Stay

## 2023-07-29 ENCOUNTER — Non-Acute Institutional Stay (SKILLED_NURSING_FACILITY): Payer: Self-pay | Admitting: Internal Medicine

## 2023-07-29 ENCOUNTER — Encounter: Payer: Self-pay | Admitting: Internal Medicine

## 2023-07-29 VITALS — BP 123/73 | HR 81 | Temp 97.8°F | Resp 18

## 2023-07-29 DIAGNOSIS — D509 Iron deficiency anemia, unspecified: Secondary | ICD-10-CM

## 2023-07-29 DIAGNOSIS — R202 Paresthesia of skin: Secondary | ICD-10-CM

## 2023-07-29 DIAGNOSIS — C9000 Multiple myeloma not having achieved remission: Secondary | ICD-10-CM

## 2023-07-29 DIAGNOSIS — C50911 Malignant neoplasm of unspecified site of right female breast: Secondary | ICD-10-CM

## 2023-07-29 DIAGNOSIS — R200 Anesthesia of skin: Secondary | ICD-10-CM

## 2023-07-29 DIAGNOSIS — Z5112 Encounter for antineoplastic immunotherapy: Secondary | ICD-10-CM | POA: Diagnosis not present

## 2023-07-29 DIAGNOSIS — F01518 Vascular dementia, unspecified severity, with other behavioral disturbance: Secondary | ICD-10-CM

## 2023-07-29 LAB — CBC WITH DIFFERENTIAL/PLATELET
Abs Immature Granulocytes: 0.01 10*3/uL (ref 0.00–0.07)
Basophils Absolute: 0 10*3/uL (ref 0.0–0.1)
Basophils Relative: 1 %
Eosinophils Absolute: 0.2 10*3/uL (ref 0.0–0.5)
Eosinophils Relative: 5 %
HCT: 37.6 % (ref 36.0–46.0)
Hemoglobin: 12.3 g/dL (ref 12.0–15.0)
Immature Granulocytes: 0 %
Lymphocytes Relative: 21 %
Lymphs Abs: 0.9 10*3/uL (ref 0.7–4.0)
MCH: 33.4 pg (ref 26.0–34.0)
MCHC: 32.7 g/dL (ref 30.0–36.0)
MCV: 102.2 fL — ABNORMAL HIGH (ref 80.0–100.0)
Monocytes Absolute: 0.6 10*3/uL (ref 0.1–1.0)
Monocytes Relative: 14 %
Neutro Abs: 2.4 10*3/uL (ref 1.7–7.7)
Neutrophils Relative %: 59 %
Platelets: 180 10*3/uL (ref 150–400)
RBC: 3.68 MIL/uL — ABNORMAL LOW (ref 3.87–5.11)
RDW: 16.5 % — ABNORMAL HIGH (ref 11.5–15.5)
WBC: 4.1 10*3/uL (ref 4.0–10.5)
nRBC: 0 % (ref 0.0–0.2)

## 2023-07-29 LAB — MAGNESIUM: Magnesium: 2.4 mg/dL (ref 1.7–2.4)

## 2023-07-29 LAB — COMPREHENSIVE METABOLIC PANEL WITH GFR
ALT: 11 U/L (ref 0–44)
AST: 17 U/L (ref 15–41)
Albumin: 3.9 g/dL (ref 3.5–5.0)
Alkaline Phosphatase: 92 U/L (ref 38–126)
Anion gap: 13 (ref 5–15)
BUN: 22 mg/dL (ref 8–23)
CO2: 27 mmol/L (ref 22–32)
Calcium: 9.8 mg/dL (ref 8.9–10.3)
Chloride: 95 mmol/L — ABNORMAL LOW (ref 98–111)
Creatinine, Ser: 6.16 mg/dL — ABNORMAL HIGH (ref 0.44–1.00)
GFR, Estimated: 6 mL/min — ABNORMAL LOW (ref >60)
Glucose, Bld: 105 mg/dL — ABNORMAL HIGH (ref 70–99)
Potassium: 4.3 mmol/L (ref 3.5–5.1)
Sodium: 135 mmol/L (ref 135–145)
Total Bilirubin: 0.3 mg/dL (ref 0.0–1.2)
Total Protein: 7.4 g/dL (ref 6.5–8.1)

## 2023-07-29 MED ORDER — BORTEZOMIB CHEMO SQ INJECTION 3.5 MG (2.5MG/ML)
1.2000 mg/m2 | Freq: Once | INTRAMUSCULAR | Status: AC
  Administered 2023-07-29: 2 mg via SUBCUTANEOUS
  Filled 2023-07-29: qty 0.8

## 2023-07-29 MED ORDER — PROCHLORPERAZINE MALEATE 10 MG PO TABS
10.0000 mg | ORAL_TABLET | Freq: Once | ORAL | Status: AC
  Administered 2023-07-29: 10 mg via ORAL
  Filled 2023-07-29: qty 1

## 2023-07-29 NOTE — Patient Instructions (Signed)
 [facility] [place] - A DEPT OF [facility]  Discharge Instructions: Thank you for choosing [facility] to provide your oncology and hematology care.  If you have a lab appointment with the Cancer Center - please note that after April 8th, 2024, all labs will be drawn in the cancer center.  You do not have to check in or register with the main entrance as you have in the past but will complete your check-in in the cancer center.  Wear comfortable clothing and clothing appropriate for easy access to any Portacath or PICC line.   We strive to give you quality time with your provider. You may need to reschedule your appointment if you arrive late (15 or more minutes).  Arriving late affects you and other patients whose appointments are after yours.  Also, if you miss three or more appointments without notifying the office, you may be dismissed from the clinic at the provider's discretion.      For prescription refill requests, have your pharmacy contact our office and allow 72 hours for refills to be completed.    Today you received the following chemotherapy and/or immunotherapy agents Velcade   To help prevent nausea and vomiting after your treatment, we encourage you to take your nausea medication as directed.  Bortezomib Injection What is this medication? BORTEZOMIB (bor TEZ oh mib) treats lymphoma. It may also be used to treat multiple myeloma, a type of bone marrow cancer. It works by blocking a protein that causes cancer cells to grow and multiply. This helps to slow or stop the spread of cancer cells. This medicine may be used for other purposes; ask your health care provider or pharmacist if you have questions. COMMON BRAND NAME(S): BORUZU, Velcade What should I tell my care team before I take this medication? They need to know if you have any of these conditions: Dehydration Diabetes Heart disease Liver disease Tingling of the fingers or toes or  other nerve disorder An unusual or allergic reaction to bortezomib, other medications, foods, dyes, or preservatives If you or your partner are pregnant or trying to get pregnant Breastfeeding How should I use this medication? This medication is injected into a vein or under the skin. It is given by your care team in a hospital or clinic setting. Talk to your care team about the use of this medication in children. Special care may be needed. Overdosage: If you think you have taken too much of this medicine contact a poison control center or emergency room at once. NOTE: This medicine is only for you. Do not share this medicine with others. What if I miss a dose? Keep appointments for follow-up doses. It is important not to miss your dose. Call your care team if you are unable to keep an appointment. What may interact with this medication? Ketoconazole Rifampin This list may not describe all possible interactions. Give your health care provider a list of all the medicines, herbs, non-prescription drugs, or dietary supplements you use. Also tell them if you smoke, drink alcohol, or use illegal drugs. Some items may interact with your medicine. What should I watch for while using this medication? Your condition will be monitored carefully while you are receiving this medication. You may need blood work while taking this medication. This medication may affect your coordination, reaction time, or judgment. Do not drive or operate machinery until you know how this medication affects you. Sit up or stand slowly to  reduce the risk of dizzy or fainting spells. Drinking alcohol with this medication can increase the risk of these side effects. This medication may increase your risk of getting an infection. Call your care team for advice if you get a fever, chills, sore throat, or other symptoms of a cold or flu. Do not treat yourself. Try to avoid being around people who are sick. Check with your care team  if you have severe diarrhea, nausea, and vomiting, or if you sweat a lot. The loss of too much body fluid may make it dangerous for you to take this medication. Talk to your care team if you may be pregnant. Serious birth defects can occur if you take this medication during pregnancy and for 7 months after the last dose. You will need a negative pregnancy test before starting this medication. Contraception is recommended while taking this medication and for 7 months after the last dose. Your care team can help you find the option that works for you. If your partner can get pregnant, use a condom during sex while taking this medication and for 4 months after the last dose. Do not breastfeed while taking this medication and for 2 months after the last dose. This medication may cause infertility. Talk to your care team if you are concerned about your fertility. What side effects may I notice from receiving this medication? Side effects that you should report to your care team as soon as possible: Allergic reactions--skin rash, itching, hives, swelling of the face, lips, tongue, or throat Bleeding--bloody or black, tar-like stools, vomiting blood or brown material that looks like coffee grounds, red or dark brown urine, small red or purple spots on skin, unusual bruising or bleeding Bleeding in the brain--severe headache, stiff neck, confusion, dizziness, change in vision, numbness or weakness of the face, arm, or leg, trouble speaking, trouble walking, vomiting Bowel blockage--stomach cramping, unable to have a bowel movement or pass gas, loss of appetite, vomiting Heart failure--shortness of breath, swelling of the ankles, feet, or hands, sudden weight gain, unusual weakness or fatigue Infection--fever, chills, cough, sore throat, wounds that don't heal, pain or trouble when passing urine, general feeling of discomfort or being unwell Liver injury--right upper belly pain, loss of appetite, nausea,  light-colored stool, dark yellow or brown urine, yellowing skin or eyes, unusual weakness or fatigue Low blood pressure--dizziness, feeling faint or lightheaded, blurry vision Lung injury--shortness of breath or trouble breathing, cough, spitting up blood, chest pain, fever Pain, tingling, or numbness in the hands or feet Severe or prolonged diarrhea Stomach pain, bloody diarrhea, pale skin, unusual weakness or fatigue, decrease in the amount of urine, which may be signs of hemolytic uremic syndrome Sudden and severe headache, confusion, change in vision, seizures, which may be signs of posterior reversible encephalopathy syndrome (PRES) TTP--purple spots on the skin or inside the mouth, pale skin, yellowing skin or eyes, unusual weakness or fatigue, fever, fast or irregular heartbeat, confusion, change in vision, trouble speaking, trouble walking Tumor lysis syndrome (TLS)--nausea, vomiting, diarrhea, decrease in the amount of urine, dark urine, unusual weakness or fatigue, confusion, muscle pain or cramps, fast or irregular heartbeat, joint pain Side effects that usually do not require medical attention (report to your care team if they continue or are bothersome): Constipation Diarrhea Fatigue Loss of appetite Nausea This list may not describe all possible side effects. Call your doctor for medical advice about side effects. You may report side effects to FDA at 1-800-FDA-1088. Where should I keep  my medication? This medication is given in a hospital or clinic. It will not be stored at home. NOTE: This sheet is a summary. It may not cover all possible information. If you have questions about this medicine, talk to your doctor, pharmacist, or health care provider.  2024 Elsevier/Gold Standard (2021-07-29 00:00:00)  BELOW ARE SYMPTOMS THAT SHOULD BE REPORTED IMMEDIATELY: *FEVER GREATER THAN 100.4 F (38 C) OR HIGHER *CHILLS OR SWEATING *NAUSEA AND VOMITING THAT IS NOT CONTROLLED WITH YOUR  NAUSEA MEDICATION *UNUSUAL SHORTNESS OF BREATH *UNUSUAL BRUISING OR BLEEDING *URINARY PROBLEMS (pain or burning when urinating, or frequent urination) *BOWEL PROBLEMS (unusual diarrhea, constipation, pain near the anus) TENDERNESS IN MOUTH AND THROAT WITH OR WITHOUT PRESENCE OF ULCERS (sore throat, sores in mouth, or a toothache) UNUSUAL RASH, SWELLING OR PAIN  UNUSUAL VAGINAL DISCHARGE OR ITCHING   Items with * indicate a potential emergency and should be followed up as soon as possible or go to the Emergency Department if any problems should occur.  Please show the CHEMOTHERAPY ALERT CARD or IMMUNOTHERAPY ALERT CARD at check-in to the Emergency Department and triage nurse.  Should you have questions after your visit or need to cancel or reschedule your appointment, please contact [facility] [place] - A DEPT OF [facility] [phone]  and follow the prompts.  Office hours are 8:00 a.m. to 4:30 p.m. Monday - Friday. Please note that voicemails left after 4:00 p.m. may not be returned until the following business day.  We are closed weekends and major holidays. You have access to a nurse at all times for urgent questions. Please call the main number to the clinic [phone] and follow the prompts.  For any non-urgent questions, you may also contact your provider using MyChart. We now offer e-Visits for anyone 40 and older to request care online for non-urgent symptoms. For details visit [email or web address].   Also download the MyChart app! Go to the app store, search "MyChart", open the app, select [place], and log in with your MyChart username and password.

## 2023-07-29 NOTE — Patient Instructions (Addendum)
 See assessment and plan under each diagnosis in the problem list and acutely for this visit:  Stage 1 infiltrating ductal carcinoma of right female breast (HCC) She remains on Arimidex  ; therefore breast cancer is considered an active diagnosis.  Multiple myeloma (HCC) Multiple myeloma diagnosis must be considered active as well as she continues to have maintenance infusions.  Numbness and tingling in both hands Today she complains of numbness only in the fingers of the left hand.  She also states that she has trouble flexing some of the fingers; but was able to do so easily when prompted to make a fist.  Again Tinel sign was negative. TSH & B12 will be updated.  Iron  deficiency anemia Current H/H is 12.2/37.6.  MCV continues to be elevated at 100.8.  B12 level will be updated.  Vascular dementia with behavior disturbance (HCC) She seems to exhibit some confabulation.  She told me that she was having symptoms which suggested trigger finger , but she was able to make a fist of the left hand easily.  She had difficulty focusing on the interview and exam and some difficulty following commands.  She was more concerned with the arrival & composition of her lunch than completing this visit.

## 2023-07-29 NOTE — Assessment & Plan Note (Signed)
 Multiple myeloma diagnosis must be considered active as well as she continues to have maintenance infusions.

## 2023-07-29 NOTE — Assessment & Plan Note (Addendum)
 She remains on Arimidex  ; therefore breast cancer is considered an active diagnosis.

## 2023-07-29 NOTE — Assessment & Plan Note (Addendum)
 Today she complains of numbness only in the fingers of the left hand.  She also states that she has trouble flexing some of the fingers; but was able to do so easily when prompted to make a fist.  Again Tinel sign was negative. TSH & B12 will be updated.

## 2023-07-29 NOTE — Progress Notes (Signed)
 NURSING HOME LOCATION:  [facility] ROOM NUMBER:  [id number] W  CODE STATUS:  DNR  PCP: Marilyne Shu, NP   This is a nursing facility follow up visit  of chronic medical diagnoses to document compliance with Regulation 483.30 (c) in The Long Term Care Survey Manual Phase 2 which mandates caregiver visit ( visits can alternate among physician, PA or NP as per statutes) within 10 days of 30 days / 60 days/ 90 days post admission to SNF date  .  Interim medical record and care since last SNF visit was updated with review of diagnostic studies and change in clinical status since last visit were documented.  HPI: She is a permanent resident of this facility with medical diagnoses of end-stage renal disease on hemodialysis, diabetes with vascular complications, GERD, dyslipidemia, essential hypertension, multiple myeloma, breast cancer, OSA, & vascular dementia.  Labs are performed at least 3 times a week at hemodialysis as well as indicated for her infusions for multiple myeloma.  On 5/15 hyponatremia was present with a sodium of 131 and chloride of 90.  Mild hyperglycemia was present with a glucose of 140.  As noted she has end-stage renal disease with associated estimated GFR ranging from a low of 3 up to a high of 7.  Despite the end-stage renal disease, she has no anemia.  Current H/H is 12.2/37.6.  Despite the absence of anemia her MCV remains elevated with current value of 100.8.  B12 is not current but was therapeutic at 462 on 12/18/2022.  Review of systems: Dementia invalidated responses.  Review of systems was negative except for complaining of numbness in the fingers of the left hand.  She then indicated that she could not flex isolated digits and had to actively compress the finger to obtain flexion . Despite this when I asked her to make a fist she easily did so without evidence of a trigger finger.  The repetitive answer to complete review of systems was monosyllabic "no."   She spent most of the time looking into the hall anticipating the arrival of her lunch tray. When it was brought to her and the covers removed; she asked to aide identify each food.   Constitutional: No fever, significant weight change, fatigue  Eyes: No redness, discharge, pain, vision change ENT/mouth: No nasal congestion,  purulent discharge, earache, change in hearing, sore throat  Cardiovascular: No chest pain, palpitations, paroxysmal nocturnal dyspnea  Respiratory: No cough, sputum production, hemoptysis, DOE, significant snoring, apnea   Gastrointestinal: No heartburn, dysphagia, abdominal pain, nausea /vomiting, rectal bleeding, melena, change in bowels Genitourinary: No dysuria, hematuria, pyuria, incontinence, nocturia Musculoskeletal: No joint stiffness, joint swelling, weakness, pain Dermatologic: No rash, pruritus, change in appearance of skin Neurologic: No dizziness, headache, syncope, seizures Psychiatric: No significant anxiety, depression, insomnia, anorexia Endocrine: No change in hair/skin/nails, excessive thirst, excessive hunger, excessive urination  Hematologic/lymphatic: No significant bruising, lymphadenopathy, abnormal bleeding  Physical exam:  Pertinent or positive findings: She exhibits somewhat of a "scowling" countenance & affect is flat.  As noted she was distracted and unfocused on the interview.  Micrognathia is suggested with minimal hirsutism along the chin.  Intraoral exam was limited as she did not follow request to open her mouth for evaluation.  Grade 1 systolic murmur at the base suggested.  She tended to exhibit some hyperventilation but no evidence of respiratory distress.  Abdomen is protuberant.  She has dramatic edema of the lower extremities with 1+ at the sock line.  Pedal pulses are not palpable.  General appearance: no acute distress, increased work of breathing is present.   Lymphatic: No lymphadenopathy about the head, neck, axilla. Eyes: No  conjunctival inflammation or lid edema is present. There is no scleral icterus. Ears:  External ear exam shows no significant lesions or deformities.   Nose:  External nasal examination shows no deformity or inflammation. Nasal mucosa are pink and moist without lesions, exudates Neck:  No thyromegaly, masses, tenderness noted.    Heart:  Normal rate and regular rhythm. S1 and S2 normal without gallop, click, rub .  Lungs: Chest clear to auscultation without wheezes, rhonchi, rales, rubs. Abdomen: Bowel sounds are normal. Abdomen is soft and nontender with no organomegaly, hernias, masses. GU: Deferred  Extremities:  No cyanosis, clubbing Neurologic exam :Balance, Rhomberg, finger to nose testing could not be completed due to clinical state Skin: Warm & dry w/o tenting. No significant lesions or rash.  See summary under each active problem in the Problem List with associated updated therapeutic plan

## 2023-07-29 NOTE — Progress Notes (Signed)
 Patient presents today for Velcade infusion. Patient is in satisfactory condition with no new complaints voiced.  Vital signs are stable.  Labs reviewed and all labs are within treatment parameters.  We will proceed with treatment per MD orders.    Treatment given today per MD orders. Tolerated infusion without adverse affects. Vital signs stable. No complaints at this time. Discharged from clinic via wheelchair in stable condition. Alert and oriented x 3. F/U with [facility] as scheduled.

## 2023-07-29 NOTE — Assessment & Plan Note (Addendum)
 She seems to exhibit some confabulation.  She told me that she was having symptoms which suggested trigger finger , but she was able to make a fist of the left hand easily.  She had difficulty focusing on the interview and exam and some difficulty following commands.  She was more concerned with the arrival & composition of her lunch than completing this visit.

## 2023-07-29 NOTE — Assessment & Plan Note (Addendum)
 Current H/H is 12.2/37.6.  MCV continues to be elevated at 100.8.  B12 level will be updated.

## 2023-07-30 DIAGNOSIS — N186 End stage renal disease: Secondary | ICD-10-CM | POA: Diagnosis not present

## 2023-07-30 DIAGNOSIS — Z992 Dependence on renal dialysis: Secondary | ICD-10-CM | POA: Diagnosis not present

## 2023-08-02 DIAGNOSIS — Z992 Dependence on renal dialysis: Secondary | ICD-10-CM | POA: Diagnosis not present

## 2023-08-02 DIAGNOSIS — N186 End stage renal disease: Secondary | ICD-10-CM | POA: Diagnosis not present

## 2023-08-03 ENCOUNTER — Other Ambulatory Visit (HOSPITAL_COMMUNITY)
Admission: RE | Admit: 2023-08-03 | Discharge: 2023-08-03 | Disposition: A | Discharge status: Home / Self Care | Source: Skilled Nursing Facility | Attending: Adult Health | Admitting: Adult Health

## 2023-08-03 DIAGNOSIS — E782 Mixed hyperlipidemia: Secondary | ICD-10-CM | POA: Insufficient documentation

## 2023-08-03 DIAGNOSIS — D6481 Anemia due to antineoplastic chemotherapy: Secondary | ICD-10-CM | POA: Insufficient documentation

## 2023-08-03 DIAGNOSIS — C9000 Multiple myeloma not having achieved remission: Secondary | ICD-10-CM | POA: Diagnosis not present

## 2023-08-03 LAB — LIPID PANEL
Cholesterol: 100 mg/dL (ref 0–200)
HDL: 44 mg/dL (ref >40)
LDL Cholesterol: 40 mg/dL (ref 0–99)
Total CHOL/HDL Ratio: 2.3 ratio
Triglycerides: 82 mg/dL (ref <150)
VLDL: 16 mg/dL (ref 0–40)

## 2023-08-03 LAB — HEPATIC FUNCTION PANEL
ALT: 10 U/L (ref 0–44)
AST: 16 U/L (ref 15–41)
Albumin: 3.4 g/dL — ABNORMAL LOW (ref 3.5–5.0)
Alkaline Phosphatase: 82 U/L (ref 38–126)
Bilirubin, Direct: <0.1 mg/dL (ref 0.0–0.2)
Total Bilirubin: 0.4 mg/dL (ref 0.0–1.2)
Total Protein: 6.5 g/dL (ref 6.5–8.1)

## 2023-08-03 LAB — TSH: TSH: 1.265 u[IU]/mL (ref 0.350–4.500)

## 2023-08-03 LAB — VITAMIN B12: Vitamin B-12: 679 pg/mL (ref 180–914)

## 2023-08-04 DIAGNOSIS — N186 End stage renal disease: Secondary | ICD-10-CM | POA: Diagnosis not present

## 2023-08-04 DIAGNOSIS — Z992 Dependence on renal dialysis: Secondary | ICD-10-CM | POA: Diagnosis not present

## 2023-08-05 ENCOUNTER — Inpatient Hospital Stay

## 2023-08-05 VITALS — BP 126/68 | HR 73 | Temp 97.6°F | Resp 20 | Wt 153.0 lb

## 2023-08-05 DIAGNOSIS — Z5112 Encounter for antineoplastic immunotherapy: Secondary | ICD-10-CM | POA: Diagnosis not present

## 2023-08-05 DIAGNOSIS — C9000 Multiple myeloma not having achieved remission: Secondary | ICD-10-CM

## 2023-08-05 LAB — COMPREHENSIVE METABOLIC PANEL WITH GFR
ALT: 11 U/L (ref 0–44)
AST: 16 U/L (ref 15–41)
Albumin: 3.7 g/dL (ref 3.5–5.0)
Alkaline Phosphatase: 96 U/L (ref 38–126)
Anion gap: 14 (ref 5–15)
BUN: 25 mg/dL — ABNORMAL HIGH (ref 8–23)
CO2: 26 mmol/L (ref 22–32)
Calcium: 9.5 mg/dL (ref 8.9–10.3)
Chloride: 94 mmol/L — ABNORMAL LOW (ref 98–111)
Creatinine, Ser: 6.17 mg/dL — ABNORMAL HIGH (ref 0.44–1.00)
GFR, Estimated: 6 mL/min — ABNORMAL LOW (ref >60)
Glucose, Bld: 138 mg/dL — ABNORMAL HIGH (ref 70–99)
Potassium: 4 mmol/L (ref 3.5–5.1)
Sodium: 134 mmol/L — ABNORMAL LOW (ref 135–145)
Total Bilirubin: 0.6 mg/dL (ref 0.0–1.2)
Total Protein: 7.3 g/dL (ref 6.5–8.1)

## 2023-08-05 LAB — MAGNESIUM: Magnesium: 2.5 mg/dL — ABNORMAL HIGH (ref 1.7–2.4)

## 2023-08-05 LAB — CBC WITH DIFFERENTIAL/PLATELET
Abs Immature Granulocytes: 0.01 10*3/uL (ref 0.00–0.07)
Basophils Absolute: 0 10*3/uL (ref 0.0–0.1)
Basophils Relative: 1 %
Eosinophils Absolute: 0.2 10*3/uL (ref 0.0–0.5)
Eosinophils Relative: 5 %
HCT: 37 % (ref 36.0–46.0)
Hemoglobin: 11.5 g/dL — ABNORMAL LOW (ref 12.0–15.0)
Immature Granulocytes: 0 %
Lymphocytes Relative: 19 %
Lymphs Abs: 0.8 10*3/uL (ref 0.7–4.0)
MCH: 31.5 pg (ref 26.0–34.0)
MCHC: 31.1 g/dL (ref 30.0–36.0)
MCV: 101.4 fL — ABNORMAL HIGH (ref 80.0–100.0)
Monocytes Absolute: 0.5 10*3/uL (ref 0.1–1.0)
Monocytes Relative: 12 %
Neutro Abs: 2.5 10*3/uL (ref 1.7–7.7)
Neutrophils Relative %: 63 %
Platelets: 171 10*3/uL (ref 150–400)
RBC: 3.65 MIL/uL — ABNORMAL LOW (ref 3.87–5.11)
RDW: 16.5 % — ABNORMAL HIGH (ref 11.5–15.5)
WBC: 4 10*3/uL (ref 4.0–10.5)
nRBC: 0 % (ref 0.0–0.2)

## 2023-08-05 MED ORDER — PROCHLORPERAZINE MALEATE 10 MG PO TABS
10.0000 mg | ORAL_TABLET | Freq: Once | ORAL | Status: AC
  Administered 2023-08-05: 10 mg via ORAL
  Filled 2023-08-05: qty 1

## 2023-08-05 MED ORDER — BORTEZOMIB CHEMO SQ INJECTION 3.5 MG (2.5MG/ML)
1.2000 mg/m2 | Freq: Once | INTRAMUSCULAR | Status: AC
  Administered 2023-08-05: 2 mg via SUBCUTANEOUS
  Filled 2023-08-05: qty 0.8

## 2023-08-05 NOTE — Patient Instructions (Signed)
 [facility] [place] - [facility]  Discharge Instructions: Thank you for choosing [facility] to provide your oncology and hematology care.  If you have a lab appointment with the Cancer Center - please note that after April 8th, 2024, all labs will be drawn in the cancer center.  You do not have to check in or register with the main entrance as you have in the past but will complete your check-in in the cancer center.  Wear comfortable clothing and clothing appropriate for easy access to any Portacath or PICC line.   We strive to give you quality time with your provider. You may need to reschedule your appointment if you arrive late (15 or more minutes).  Arriving late affects you and other patients whose appointments are after yours.  Also, if you miss three or more appointments without notifying the office, you may be dismissed from the clinic at the provider's discretion.      For prescription refill requests, have your pharmacy contact our office and allow 72 hours for refills to be completed.    Today you received the following chemotherapy and/or immunotherapy agents Velcade      To help prevent nausea and vomiting after your treatment, we encourage you to take your nausea medication as directed.  BELOW ARE SYMPTOMS THAT SHOULD BE REPORTED IMMEDIATELY: *FEVER GREATER THAN 100.4 F (38 C) OR HIGHER *CHILLS OR SWEATING *NAUSEA AND VOMITING THAT IS NOT CONTROLLED WITH YOUR NAUSEA MEDICATION *UNUSUAL SHORTNESS OF BREATH *UNUSUAL BRUISING OR BLEEDING *URINARY PROBLEMS (pain or burning when urinating, or frequent urination) *BOWEL PROBLEMS (unusual diarrhea, constipation, pain near the anus) TENDERNESS IN MOUTH AND THROAT WITH OR WITHOUT PRESENCE OF ULCERS (sore throat, sores in mouth, or a toothache) UNUSUAL RASH, SWELLING OR PAIN  UNUSUAL VAGINAL DISCHARGE OR ITCHING   Items with * indicate a potential emergency and should be followed up  as soon as possible or go to the Emergency Department if any problems should occur.  Please show the CHEMOTHERAPY ALERT CARD or IMMUNOTHERAPY ALERT CARD at check-in to the Emergency Department and triage nurse.  Should you have questions after your visit or need to cancel or reschedule your appointment, please contact [facility] [place] - A DEPT OF [facility] [phone]  and follow the prompts.  Office hours are 8:00 a.m. to 4:30 p.m. Monday - Friday. Please note that voicemails left after 4:00 p.m. may not be returned until the following business day.  We are closed weekends and major holidays. You have access to a nurse at all times for urgent questions. Please call the main number to the clinic [phone] and follow the prompts.  For any non-urgent questions, you may also contact your provider using MyChart. We now offer e-Visits for anyone 44 and older to request care online for non-urgent symptoms. For details visit [email or web address].   Also download the MyChart app! Go to the app store, search "MyChart", open the app, select [place], and log in with your MyChart username and password.

## 2023-08-05 NOTE — Progress Notes (Signed)
 Patient presents today for Velcade  injection per providers order.  Vital signs and labs within parameters for injection.  Stable during administration without incident; injection site WNL; see MAR for injection details.  Patient tolerated procedure well and without incident.  No questions or complaints noted at this time.

## 2023-08-06 DIAGNOSIS — N186 End stage renal disease: Secondary | ICD-10-CM | POA: Diagnosis not present

## 2023-08-06 DIAGNOSIS — Z992 Dependence on renal dialysis: Secondary | ICD-10-CM | POA: Diagnosis not present

## 2023-08-06 LAB — PROTEIN ELECTROPHORESIS, SERUM
A/G Ratio: 1.3 (ref 0.7–1.7)
Albumin ELP: 3.8 g/dL (ref 2.9–4.4)
Alpha-1-Globulin: 0.2 g/dL (ref 0.0–0.4)
Alpha-2-Globulin: 0.8 g/dL (ref 0.4–1.0)
Beta Globulin: 1 g/dL (ref 0.7–1.3)
Gamma Globulin: 0.8 g/dL (ref 0.4–1.8)
Globulin, Total: 2.9 g/dL (ref 2.2–3.9)
M-Spike, %: 0.4 g/dL — ABNORMAL HIGH
Total Protein ELP: 6.7 g/dL (ref 6.0–8.5)

## 2023-08-06 LAB — KAPPA/LAMBDA LIGHT CHAINS
Kappa free light chain: 179.9 mg/L — ABNORMAL HIGH (ref 3.3–19.4)
Kappa, lambda light chain ratio: 0.31 (ref 0.26–1.65)
Lambda free light chains: 589.2 mg/L — ABNORMAL HIGH (ref 5.7–26.3)

## 2023-08-08 DIAGNOSIS — N186 End stage renal disease: Secondary | ICD-10-CM | POA: Diagnosis not present

## 2023-08-08 DIAGNOSIS — Z992 Dependence on renal dialysis: Secondary | ICD-10-CM | POA: Diagnosis not present

## 2023-08-09 DIAGNOSIS — Z992 Dependence on renal dialysis: Secondary | ICD-10-CM | POA: Diagnosis not present

## 2023-08-09 DIAGNOSIS — N186 End stage renal disease: Secondary | ICD-10-CM | POA: Diagnosis not present

## 2023-08-09 LAB — IMMUNOFIXATION ELECTROPHORESIS
IgA: 415 mg/dL (ref 64–422)
IgG (Immunoglobin G), Serum: 849 mg/dL (ref 586–1602)
IgM (Immunoglobulin M), Srm: 11 mg/dL — ABNORMAL LOW (ref 26–217)
Total Protein ELP: 6.6 g/dL (ref 6.0–8.5)

## 2023-08-11 DIAGNOSIS — N186 End stage renal disease: Secondary | ICD-10-CM | POA: Diagnosis not present

## 2023-08-11 DIAGNOSIS — Z992 Dependence on renal dialysis: Secondary | ICD-10-CM | POA: Diagnosis not present

## 2023-08-12 ENCOUNTER — Inpatient Hospital Stay

## 2023-08-12 ENCOUNTER — Inpatient Hospital Stay: Attending: Hematology | Admitting: Hematology

## 2023-08-12 VITALS — BP 159/85 | HR 78 | Temp 97.9°F | Resp 18

## 2023-08-12 DIAGNOSIS — Z170 Estrogen receptor positive status [ER+]: Secondary | ICD-10-CM | POA: Insufficient documentation

## 2023-08-12 DIAGNOSIS — Z79811 Long term (current) use of aromatase inhibitors: Secondary | ICD-10-CM | POA: Insufficient documentation

## 2023-08-12 DIAGNOSIS — C50411 Malignant neoplasm of upper-outer quadrant of right female breast: Secondary | ICD-10-CM | POA: Insufficient documentation

## 2023-08-12 DIAGNOSIS — Z9011 Acquired absence of right breast and nipple: Secondary | ICD-10-CM | POA: Diagnosis not present

## 2023-08-12 DIAGNOSIS — Z5112 Encounter for antineoplastic immunotherapy: Secondary | ICD-10-CM | POA: Diagnosis not present

## 2023-08-12 DIAGNOSIS — C9000 Multiple myeloma not having achieved remission: Secondary | ICD-10-CM

## 2023-08-12 LAB — CBC WITH DIFFERENTIAL/PLATELET
Abs Immature Granulocytes: 0 10*3/uL (ref 0.00–0.07)
Basophils Absolute: 0 10*3/uL (ref 0.0–0.1)
Basophils Relative: 1 %
Eosinophils Absolute: 0.2 10*3/uL (ref 0.0–0.5)
Eosinophils Relative: 3 %
HCT: 35.3 % — ABNORMAL LOW (ref 36.0–46.0)
Hemoglobin: 11.2 g/dL — ABNORMAL LOW (ref 12.0–15.0)
Immature Granulocytes: 0 %
Lymphocytes Relative: 16 %
Lymphs Abs: 0.8 10*3/uL (ref 0.7–4.0)
MCH: 32.7 pg (ref 26.0–34.0)
MCHC: 31.7 g/dL (ref 30.0–36.0)
MCV: 103.2 fL — ABNORMAL HIGH (ref 80.0–100.0)
Monocytes Absolute: 0.5 10*3/uL (ref 0.1–1.0)
Monocytes Relative: 11 %
Neutro Abs: 3.4 10*3/uL (ref 1.7–7.7)
Neutrophils Relative %: 69 %
Platelets: 188 10*3/uL (ref 150–400)
RBC: 3.42 MIL/uL — ABNORMAL LOW (ref 3.87–5.11)
RDW: 16.4 % — ABNORMAL HIGH (ref 11.5–15.5)
WBC: 4.9 10*3/uL (ref 4.0–10.5)
nRBC: 0 % (ref 0.0–0.2)

## 2023-08-12 LAB — MAGNESIUM: Magnesium: 2.4 mg/dL (ref 1.7–2.4)

## 2023-08-12 LAB — COMPREHENSIVE METABOLIC PANEL WITH GFR
ALT: 11 U/L (ref 0–44)
AST: 19 U/L (ref 15–41)
Albumin: 3.6 g/dL (ref 3.5–5.0)
Alkaline Phosphatase: 94 U/L (ref 38–126)
Anion gap: 11 (ref 5–15)
BUN: 35 mg/dL — ABNORMAL HIGH (ref 8–23)
CO2: 28 mmol/L (ref 22–32)
Calcium: 9.6 mg/dL (ref 8.9–10.3)
Chloride: 97 mmol/L — ABNORMAL LOW (ref 98–111)
Creatinine, Ser: 7.23 mg/dL — ABNORMAL HIGH (ref 0.44–1.00)
GFR, Estimated: 5 mL/min — ABNORMAL LOW (ref >60)
Glucose, Bld: 173 mg/dL — ABNORMAL HIGH (ref 70–99)
Potassium: 4.7 mmol/L (ref 3.5–5.1)
Sodium: 136 mmol/L (ref 135–145)
Total Bilirubin: 0.4 mg/dL (ref 0.0–1.2)
Total Protein: 7 g/dL (ref 6.5–8.1)

## 2023-08-12 MED ORDER — BORTEZOMIB CHEMO SQ INJECTION 3.5 MG (2.5MG/ML)
1.2000 mg/m2 | Freq: Once | INTRAMUSCULAR | Status: AC
  Administered 2023-08-12: 2 mg via SUBCUTANEOUS
  Filled 2023-08-12: qty 0.8

## 2023-08-12 MED ORDER — PROCHLORPERAZINE MALEATE 10 MG PO TABS
10.0000 mg | ORAL_TABLET | Freq: Once | ORAL | Status: AC
  Administered 2023-08-12: 10 mg via ORAL
  Filled 2023-08-12: qty 1

## 2023-08-12 NOTE — Patient Instructions (Signed)
 [facility] - A DEPT OF [place]  Discharge Instructions: Thank you for choosing [facility] to provide your oncology and hematology care.  If you have a lab appointment with the Cancer Center - please note that after April 8th, 2024, all labs will be drawn in the cancer center.  You do not have to check in or register with the main entrance as you have in the past but will complete your check-in in the cancer center.  Wear comfortable clothing and clothing appropriate for easy access to any Portacath or PICC line.   We strive to give you quality time with your provider. You may need to reschedule your appointment if you arrive late (15 or more minutes).  Arriving late affects you and other patients whose appointments are after yours.  Also, if you miss three or more appointments without notifying the office, you may be dismissed from the clinic at the provider's discretion.      For prescription refill requests, have your pharmacy contact our office and allow 72 hours for refills to be completed.    Today you received the following chemotherapy-velcade    To help prevent nausea and vomiting after your treatment, we encourage you to take your nausea medication as directed.  BELOW ARE SYMPTOMS THAT SHOULD BE REPORTED IMMEDIATELY: *FEVER GREATER THAN 100.4 F (38 C) OR HIGHER *CHILLS OR SWEATING *NAUSEA AND VOMITING THAT IS NOT CONTROLLED WITH YOUR NAUSEA MEDICATION *UNUSUAL SHORTNESS OF BREATH *UNUSUAL BRUISING OR BLEEDING *URINARY PROBLEMS (pain or burning when urinating, or frequent urination) *BOWEL PROBLEMS (unusual diarrhea, constipation, pain near the anus) TENDERNESS IN MOUTH AND THROAT WITH OR WITHOUT PRESENCE OF ULCERS (sore throat, sores in mouth, or a toothache) UNUSUAL RASH, SWELLING OR PAIN  UNUSUAL VAGINAL DISCHARGE OR ITCHING   Items with * indicate a potential emergency and should be followed up as soon as possible or go to the  Emergency Department if any problems should occur.  Please show the CHEMOTHERAPY ALERT CARD or IMMUNOTHERAPY ALERT CARD at check-in to the Emergency Department and triage nurse.  Should you have questions after your visit or need to cancel or reschedule your appointment, please contact [facility] [place] - A DEPT OF [facility] [phone]  and follow the prompts.  Office hours are 8:00 a.m. to 4:30 p.m. Monday - Friday. Please note that voicemails left after 4:00 p.m. may not be returned until the following business day.  We are closed weekends and major holidays. You have access to a nurse at all times for urgent questions. Please call the main number to the clinic [phone] and follow the prompts.  For any non-urgent questions, you may also contact your provider using MyChart. We now offer e-Visits for anyone 29 and older to request care online for non-urgent symptoms. For details visit [email or web address].   Also download the MyChart app! Go to the app store, search "MyChart", open the app, select [facility], and log in with your MyChart username and password.

## 2023-08-12 NOTE — Progress Notes (Signed)
Patient has been assessed, vital signs and labs have been reviewed by Dr. Katragadda. ANC, Creatinine, LFTs, and Platelets are within treatment parameters per Dr. Katragadda. The patient is good to proceed with treatment at this time. Primary RN and pharmacy aware.  

## 2023-08-12 NOTE — Progress Notes (Signed)
 [facility] [address] [place] [address]    Clinic Day:  08/12/23   Referring physician: Marilyne Shu, NP  Patient Care Team: Marilyne Shu, NP as PCP - General (Geriatric Medicine) Elmyra Haggard, MD as PCP - Cardiology (Cardiology) Rourk, Windsor Hatcher, MD as Consulting Physician (Gastroenterology) Paulett Boros, MD as Medical Oncologist (Medical Oncology) Chris Countryman, MD (Inactive) as Consulting Physician (Nephrology) [facility]   ASSESSMENT & PLAN:   Assessment: 1.  IgA lambda plasma cell myeloma, stage II, standard risk: -BMBX on 09/20/2018 shows plasma cell myeloma, 30% plasma cells.  FISH panel was normal.  Chromosome analysis normal. -Labs at diagnosis on 08/29/2018 with M spike 0.9 g.  Kappa light chains 148, lambda light chains 1132, ratio 0.13.  LDH normal.  Beta-2  microglobulin 13.5.  She had transfusion dependent anemia. -Velcade  and dexamethasone  started on 10/11/2018.  Revlimid not started because of worsening/unstable renal function. -She has been resident at [facility] since 10/12/2019.   2.  Stage I IDC of the right breast, ER/PR positive, HER-2 negative: -She is on anastrozole .   Plan: 1.  IgA lambda plasma cell myeloma: - She is tolerating weekly Velcade  reasonably well. - No recent infections since last visit. - Multiple myeloma labs from 08/05/2023: M spike is 0.4 g and stable.  FLC ratio is normal.  Immunofixation positive for IgA lambda. - She will continue Velcade  weekly.  RTC 8 weeks for follow-up with multiple myeloma labs 2 weeks prior.   2.  ESRD on HD: - Continue HD on Monday, Wednesday and Friday.   3.  Peripheral neuropathy: - She has tingling/numbness in the left hand fingertips which is stable.  No neuropathic pains.  No neuropathy elsewhere.   4.  Right breast cancer: - She will continue anastrozole  daily.  Orders Placed This Encounter  Procedures   Magnesium     Standing Status:   Future    Expected Date:   09/09/2023    Expiration Date:   09/08/2024   CBC with Differential    Standing Status:   Future    Expected Date:   09/09/2023    Expiration Date:   09/08/2024   Comprehensive metabolic panel    Standing Status:   Future    Expected Date:   09/09/2023    Expiration Date:   09/08/2024   Magnesium    Standing Status:   Future    Expected Date:   09/16/2023    Expiration Date:   09/15/2024   CBC with Differential    Standing Status:   Future    Expected Date:   09/16/2023    Expiration Date:   09/15/2024   Comprehensive metabolic panel    Standing Status:   Future    Expected Date:   09/16/2023    Expiration Date:   09/15/2024   Magnesium    Standing Status:   Future    Expected Date:   09/23/2023    Expiration Date:   09/22/2024   CBC with Differential    Standing Status:   Future    Expected Date:   09/23/2023    Expiration Date:   09/22/2024   Comprehensive metabolic panel    Standing Status:   Future    Expected Date:   09/23/2023    Expiration Date:   09/22/2024   Magnesium    Standing Status:   Future    Expected Date:   09/30/2023  Expiration Date:   09/29/2024   CBC with Differential    Standing Status:   Future    Expected Date:   09/30/2023    Expiration Date:   09/29/2024   Comprehensive metabolic panel    Standing Status:   Future    Expected Date:   09/30/2023    Expiration Date:   09/29/2024   Magnesium    Standing Status:   Future    Expected Date:   10/07/2023    Expiration Date:   10/06/2024   CBC with Differential    Standing Status:   Future    Expected Date:   10/07/2023    Expiration Date:   10/06/2024   Comprehensive metabolic panel    Standing Status:   Future    Expected Date:   10/07/2023    Expiration Date:   10/06/2024   Magnesium    Standing Status:   Future    Expected Date:   10/14/2023    Expiration Date:   10/13/2024   CBC with Differential    Standing Status:   Future    Expected Date:   10/14/2023    Expiration  Date:   10/13/2024   Comprehensive metabolic panel    Standing Status:   Future    Expected Date:   10/14/2023    Expiration Date:   10/13/2024   Magnesium    Standing Status:   Future    Expected Date:   10/21/2023    Expiration Date:   10/20/2024   CBC with Differential    Standing Status:   Future    Expected Date:   10/21/2023    Expiration Date:   10/20/2024   Comprehensive metabolic panel    Standing Status:   Future    Expected Date:   10/21/2023    Expiration Date:   10/20/2024   Magnesium    Standing Status:   Future    Expected Date:   10/28/2023    Expiration Date:   10/27/2024   CBC with Differential    Standing Status:   Future    Expected Date:   10/28/2023    Expiration Date:   10/27/2024   Comprehensive metabolic panel    Standing Status:   Future    Expected Date:   10/28/2023    Expiration Date:   10/27/2024   Magnesium    Standing Status:   Future    Expected Date:   11/04/2023    Expiration Date:   11/03/2024   CBC with Differential    Standing Status:   Future    Expected Date:   11/04/2023    Expiration Date:   11/03/2024   Comprehensive metabolic panel    Standing Status:   Future    Expected Date:   11/04/2023    Expiration Date:   11/03/2024   Magnesium    Standing Status:   Future    Expected Date:   11/11/2023    Expiration Date:   11/10/2024   CBC with Differential    Standing Status:   Future    Expected Date:   11/11/2023    Expiration Date:   11/10/2024   Comprehensive metabolic panel    Standing Status:   Future    Expected Date:   11/11/2023    Expiration Date:   11/10/2024   Magnesium    Standing Status:   Future    Expected Date:   11/18/2023    Expiration Date:  11/17/2024   CBC with Differential    Standing Status:   Future    Expected Date:   11/18/2023    Expiration Date:   11/17/2024   Comprehensive metabolic panel    Standing Status:   Future    Expected Date:   11/18/2023    Expiration Date:   11/17/2024   Magnesium    Standing Status:   Future     Expected Date:   11/25/2023    Expiration Date:   11/24/2024   CBC with Differential    Standing Status:   Future    Expected Date:   11/25/2023    Expiration Date:   11/24/2024   Comprehensive metabolic panel    Standing Status:   Future    Expected Date:   11/25/2023    Expiration Date:   11/24/2024   Magnesium    Standing Status:   Future    Expected Date:   12/02/2023    Expiration Date:   12/01/2024   CBC with Differential    Standing Status:   Future    Expected Date:   12/02/2023    Expiration Date:   12/01/2024   Comprehensive metabolic panel    Standing Status:   Future    Expected Date:   12/02/2023    Expiration Date:   12/01/2024   Magnesium    Standing Status:   Future    Expected Date:   12/09/2023    Expiration Date:   12/08/2024   CBC with Differential    Standing Status:   Future    Expected Date:   12/09/2023    Expiration Date:   12/08/2024   Comprehensive metabolic panel    Standing Status:   Future    Expected Date:   12/09/2023    Expiration Date:   12/08/2024   Protein electrophoresis, serum    Standing Status:   Future    Expected Date:   09/23/2023    Expiration Date:   08/11/2024    Release to patient:   Immediate   Kappa/lambda light chains    Standing Status:   Future    Expected Date:   09/23/2023    Expiration Date:   08/11/2024   Immunofixation electrophoresis    Standing Status:   Future    Expected Date:   09/23/2023    Expiration Date:   08/11/2024    Release to patient:   Immediate      I,Helena R Teague,acting as a scribe for Paulett Boros, MD.,have documented all relevant documentation on the behalf of Paulett Boros, MD,as directed by  Paulett Boros, MD while in the presence of Paulett Boros, MD.  I, Paulett Boros MD, have reviewed the above documentation for accuracy and completeness, and I agree with the above.       Paulett Boros, MD   6/5/202512:44 PM  CHIEF COMPLAINT:   Diagnosis: plasma cell myeloma  and right breast cancer    Cancer Staging  Stage 1 infiltrating ductal carcinoma of right female breast [facility]) Staging form: Breast, AJCC 7th Edition - Clinical stage from 08/30/2015: Stage IA (T1c, N0, M0) - Signed by Doretta Gant, PA-C on 08/30/2015    Prior Therapy: none  Current Therapy:  maintenance Velcade  & Decadron  3/4 weeks; anastrozole    HISTORY OF PRESENT ILLNESS:   Oncology History  Stage 1 infiltrating ductal carcinoma of right female breast (HCC)  09/12/2014 Mammogram   Mass in upper outer R breast, middle third depth appears  slightly larger than it was on prior exam with more irreg spiculated margins   10/02/2014 Pathology Results   biopsy with invasive ductal carcinoma high grade 1.1 cm, dcis solid type. additional R breast tissue, excision, invasive ductal high grade 0.9 cm    10/24/2014 Pathology Results   no residual invasive carcinoma, DCIS, focal, atypical ductal hyperplasia, 0/7 LN positive for metastatic carcinoma ER > 90%, PR 30%, HER 2 2+   10/24/2014 Cancer Staging   T1cN0M0   10/24/2014 Surgery   R mastectomy, T1c, N0   12/28/2014 - 11/22/2015 Chemotherapy   Taxol/herceptin  weekly X 12, Herceptin  every 21 days, last due in September 2017   03/11/2015 -  Anti-estrogen oral therapy   Arimidex  1 mg daily   09/12/2015 Imaging   MUGA- The left ventricular ejection fraction equals 68%.   06/08/2016 Treatment Plan Change   Started Nerlynx    Multiple myeloma (HCC)  10/04/2018 Initial Diagnosis   Multiple myeloma (HCC)   10/11/2018 - 10/28/2021 Chemotherapy   Patient is on Treatment Plan : MYELOMA NON-TRANSPLANT CANDIDATES VRd weekly d21d      11/25/2021 -  Chemotherapy   Patient is on Treatment Plan : MYELOMA MAINTENANCE Bortezomib  SQ q7d        INTERVAL HISTORY:   Carly Wood is a 88 y.o. female presenting to clinic today for follow up of plasma cell myeloma and breast cancer. She was last seen by me on 06/15/23.  Today, she states that she is doing well  overall. Her appetite level is at 100%. Her energy level is at 75%. Carly Wood reports left back pain that began this morning and resolved on its own. She notes numbness and tingling in the left hand and left fingertips. She denies dropping anything or peripheral neuropathy in the lower extremities.   Carly Wood denies any side effects from Velcade  injections.   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   Dysphagia    Dyspnea    with activity   GERD (gastroesophageal reflux disease)    Glaucoma    Hyperlipidemia    Hypertension    Multiple myeloma (HCC)    Pneumonia    Stage 1 infiltrating ductal carcinoma of right female breast (HCC) 08/21/2015   ER+ PR+ HER 2 neu + (3+) T1cN0     Surgical History: Past Surgical History:  Procedure Laterality Date   A/V FISTULAGRAM Right 07/05/2020   Procedure: A/V Fistulagram;  Surgeon: Richrd Char, MD;  [facility]: [facility];  Service: Cardiovascular;  Laterality: Right;   AV FISTULA PLACEMENT Left 11/22/2017   Procedure: ARTERIOVENOUS (AV) FISTULA CREATION LEFT ARM;  Surgeon: Richrd Char, MD;  [facility];  Service: Vascular;  Laterality: Left;   AV FISTULA PLACEMENT Right 04/04/2020   Procedure: RIGHT ARM ARTERIOVENOUS FISTULA CREATION;  Surgeon: Mayo Speck, MD;  Location: [facility] ORS;  Service: Vascular;  Laterality: Right;   AV FISTULA PLACEMENT Right 08/20/2020   Procedure: ARTERIOVENOUS (AV) FISTULA LIGATION RIGHT ARM;  Surgeon: Stacia Dynes  E, MD;  Location: [facility] OR;  Service: Vascular;   Laterality: Right;   BIOPSY  08/07/2016   Procedure: BIOPSY;  Surgeon: Suzette Espy, MD;  Location: [facility];  Service: Endoscopy;;  gastric ulcer biopsy   COLONOSCOPY     ESOPHAGOGASTRODUODENOSCOPY N/A 08/07/2016   LA Grade A esophagitis s/p dilation, small hiatal hernia, multiple gastric ulcers and erosions, duodenal erosions s/p biopsy. Negative H.pylori    ESOPHAGOGASTRODUODENOSCOPY N/A 11/27/2016   normal esophagus, previously noted gastric ulcers completely healed, normal duodenum.    ESOPHAGOGASTRODUODENOSCOPY (EGD) WITH PROPOFOL  N/A 07/23/2020   Procedure: ESOPHAGOGASTRODUODENOSCOPY (EGD) WITH PROPOFOL ;  Surgeon: Ruby Corporal, MD;  Location: [facility];  Service: Endoscopy;  Laterality: N/A;   FISTULA SUPERFICIALIZATION Left 02/14/2018   Procedure: FISTULA SUPERFICIALIZATION LEFT ARM;  Surgeon: Dannis Dy, MD;  Location[facility] OR;  Service: Vascular;  Laterality: Left;   FRACTURE SURGERY Right    ankle   HOT HEMOSTASIS  07/23/2020   Procedure: HOT HEMOSTASIS (ARGON PLASMA COAGULATION/BICAP);  Surgeon: Ruby Corporal, MD;  Location: [facility];  Service: Endoscopy;;   INTRAMEDULLARY (IM) NAIL INTERTROCHANTERIC Right 07/12/2020   Procedure: INTRAMEDULLARY (IM) NAIL INTERTROCHANTRIC;  Surgeon: Tonita Frater, MD;  Location: [facility];  Service: Orthopedics;  Laterality: Right;   IR FLUORO GUIDE CV LINE LEFT  12/21/2022   IR US  GUIDE VASC ACCESS LEFT  12/21/2022   MALONEY DILATION N/A 08/07/2016   Procedure: Londa Rival DILATION;  Surgeon: Suzette Espy, MD;  [facility]: [facility];  Service: Endoscopy;  Laterality: N/A;   MASTECTOMY, PARTIAL Right    PERIPHERAL VASCULAR BALLOON ANGIOPLASTY Left 07/13/2019   Procedure: PERIPHERAL VASCULAR BALLOON ANGIOPLASTY;  Surgeon: Young Hensen, MD;  [facility]: [facility];  Service: Cardiovascular;  Laterality: Left;  arm fistulogram   PERIPHERAL VASCULAR BALLOON ANGIOPLASTY Right 05/22/2020   Procedure:  PERIPHERAL VASCULAR BALLOON ANGIOPLASTY;  Surgeon: Carlene Che, MD;  [facility]: [facility];  Service: Cardiovascular;  Laterality: Right;  arm fistula   PERIPHERAL VASCULAR BALLOON ANGIOPLASTY Right 07/05/2020   Procedure: PERIPHERAL VASCULAR BALLOON ANGIOPLASTY;  Surgeon: Richrd Char, MD;  [facility]: [facility];  Service: Cardiovascular;  Laterality: Right;  arm fistula   PORT-A-CATH REMOVAL Left 11/22/2017   Procedure: REMOVAL PORT-A-CATH LEFT CHEST;  Surgeon: Richrd Char, MD;  Location: [facility] OR;  Service: Vascular;  Laterality: Left;   RETINAL DETACHMENT SURGERY Right    SCLEROTHERAPY  07/23/2020   Procedure: SCLEROTHERAPY;  Surgeon: Ruby Corporal, MD;  [facility];  Service: Endoscopy;;   UPPER EXTREMITY VENOGRAPHY N/A 08/22/2020   Procedure: LEFT UPPER & CENTRAL VENOGRAPHY;  Surgeon: Young Hensen, MD;  [facility]: [facility];  Service: Cardiovascular;  Laterality: N/A;    Social History: Social History   Socioeconomic History   Marital status: Single    Spouse name: Not on file   Number of children: Not on file   Years of education: Not on file   Highest education level: Not on file  Occupational History   Occupation: retired   Tobacco Use   Smoking status: Never   Smokeless tobacco: Never  Vaping Use   Vaping status: Never Used  Substance and Sexual Activity   Alcohol  use: No    Alcohol /week: 0.0 standard drinks of alcohol    Drug use: No   Sexual activity: Never  Other Topics Concern   Not on file  Social History Narrative   Long term resident  of SNF    Social Drivers of Health   Financial Resource Strain: Low Risk  (01/09/2020)   Overall Financial Resource Strain (CARDIA)    Difficulty of Paying Living Expenses: Not very hard  Food Insecurity: Patient Unable To Answer (05/24/2023)   Hunger Vital Sign    Worried About Running Out of Food in the Last Year: Patient unable to answer    Ran Out of Food in the Last  Year: Patient unable to answer  Transportation Needs: Patient Unable To Answer (05/24/2023)   PRAPARE - Transportation    Lack of Transportation (Medical): Patient unable to answer    Lack of Transportation (Non-Medical): Patient unable to answer  Physical Activity: Inactive (01/09/2020)   Exercise Vital Sign    Days of Exercise per Week: 0 days    Minutes of Exercise per Session: 0 min  Stress: No Stress Concern Present (01/09/2020)   [place] [employer] - Occupational Stress Questionnaire    Feeling of Stress : Not at all  Social Connections: Patient Unable To Answer (05/24/2023)   Social Connection and Isolation Panel [NHANES]    Frequency of Communication with Friends and Family: Patient unable to answer    Frequency of Social Gatherings with Friends and Family: Patient unable to answer    Attends Religious Services: Patient unable to answer    Active Member of Clubs or Organizations: Patient unable to answer    Attends Banker Meetings: Patient unable to answer    Marital Status: Patient unable to answer  Intimate Partner Violence: Patient Unable To Answer (05/24/2023)   Humiliation, Afraid, Rape, and Kick questionnaire    Fear of Current or Ex-Partner: Patient unable to answer    Emotionally Abused: Patient unable to answer    Physically Abused: Patient unable to answer    Sexually Abused: Patient unable to answer    Family History: Family History  Problem Relation Age of Onset   Multiple myeloma Sister    Brain cancer Sister    Dementia Mother        died at 1   Stroke Mother    Heart failure Mother    Diabetes Mother    Heart disease Father    Prostate cancer Brother    Colon cancer Neg Hx     Current Medications:  Current Outpatient Medications:    acetaminophen  (TYLENOL ) 325 MG tablet, Take 650 mg by mouth 3 (three) times daily., Disp: , Rfl:    acyclovir  (ZOVIRAX ) 200 MG capsule, Take 200 mg by mouth in the morning. (0800), Disp:  , Rfl:    anastrozole  (ARIMIDEX ) 1 MG tablet, TAKE 1 TABLET BY MOUTH DAILY, Disp: 30 tablet, Rfl: 6   aspirin  EC 81 MG tablet, Take 1 tablet (81 mg total) by mouth daily. Swallow whole., Disp: , Rfl:    atorvastatin  (LIPITOR) 40 MG tablet, Take 1 tablet (40 mg total) by mouth daily., Disp: , Rfl:    Calcium  Carb-Cholecalciferol  (CALCIUM  + VITAMIN D3) 500-5 MG-MCG TABS, Take 1 tablet by mouth daily., Disp: , Rfl:    melatonin 5 MG TABS, Take 5 mg by mouth at bedtime., Disp: , Rfl:    multivitamin (RENA-VIT) TABS tablet, Take 1 tablet by mouth at bedtime., Disp: , Rfl:    Nutritional Supplements (ENSURE ENLIVE PO), Take 237 mLs by mouth every evening., Disp: , Rfl:    ondansetron  (ZOFRAN -ODT) 4 MG disintegrating tablet, Take 4 mg by mouth daily before breakfast., Disp: , Rfl:  pantoprazole  (PROTONIX ) 40 MG tablet, Take 40 mg by mouth 2 (two) times daily., Disp: , Rfl:    predniSONE  (DELTASONE ) 50 MG tablet, Take 1 tablet (50 mg total) by mouth daily with breakfast., Disp: , Rfl:    sennosides-docusate sodium (SENOKOT-S) 8.6-50 MG tablet, Take 1 tablet by mouth 2 (two) times daily., Disp: , Rfl:    sertraline  (ZOLOFT ) 25 MG tablet, Take 25 mg by mouth daily. Was taking 50 mg but it stopped on 05-25-2023, Disp: , Rfl:    sevelamer  carbonate (RENVELA ) 800 MG tablet, Take 800 mg by mouth daily., Disp: , Rfl:    sucralfate  (CARAFATE ) 1 GM/10ML suspension, Take 10 mLs by mouth 2 (two) times daily., Disp: , Rfl:  No current facility-administered medications for this visit.  Facility-Administered Medications Ordered in Other Visits:    lanreotide acetate  (SOMATULINE DEPOT ) 120 MG/0.5ML injection, , , ,    lanreotide acetate  (SOMATULINE DEPOT ) 120 MG/0.5ML injection, , , ,    lanreotide acetate  (SOMATULINE DEPOT ) 120 MG/0.5ML injection, , , ,    lanreotide acetate  (SOMATULINE DEPOT ) 120 MG/0.5ML injection, , , ,    octreotide  (SANDOSTATIN  LAR) 30 MG IM injection, , , ,    octreotide  (SANDOSTATIN  LAR) 30  MG IM injection, , , ,    Allergies: Allergies  Allergen Reactions   Ace Inhibitors Other (See Comments) and Cough    Tongue swelling Angioedema    Angiotensin Receptor Blockers Other (See Comments)    Angioedema with ACE-I   Penicillins Other (See Comments)    Unknown reaction    REVIEW OF SYSTEMS:   Review of Systems  Constitutional:  Negative for chills, fatigue and fever.  HENT:   Negative for lump/mass, mouth sores, nosebleeds, sore throat and trouble swallowing.   Eyes:  Negative for eye problems.  Respiratory:  Negative for cough and shortness of breath.   Cardiovascular:  Negative for chest pain, leg swelling and palpitations.  Gastrointestinal:  Negative for abdominal pain, constipation, diarrhea, nausea and vomiting.  Genitourinary:  Negative for bladder incontinence, difficulty urinating, dysuria, frequency, hematuria and nocturia.   Musculoskeletal:  Positive for back pain (4/10 severity). Negative for arthralgias, flank pain, myalgias and neck pain.  Skin:  Negative for itching and rash.  Neurological:  Positive for numbness (in left fingers/hand). Negative for dizziness and headaches.  Hematological:  Does not bruise/bleed easily.  Psychiatric/Behavioral:  Negative for depression, sleep disturbance and suicidal ideas. The patient is not nervous/anxious.   All other systems reviewed and are negative.    VITALS:   Blood pressure (!) 159/85, pulse 78, temperature 97.9 F (36.6 C), temperature source Oral, resp. rate 18, SpO2 99%.  Wt Readings from Last 3 Encounters:  08/05/23 153 lb (69.4 kg)  07/29/23 153 lb 6.4 oz (69.6 kg)  07/15/23 157 lb 12.8 oz (71.6 kg)    There is no height or weight on file to calculate BMI.  Performance status (ECOG): 1 - Symptomatic but completely ambulatory  PHYSICAL EXAM:   Physical Exam Vitals and nursing note reviewed. Exam conducted with a chaperone present.  Constitutional:      Appearance: Normal appearance.   Cardiovascular:     Rate and Rhythm: Normal rate and regular rhythm.     Pulses: Normal pulses.     Heart sounds: Normal heart sounds.  Pulmonary:     Effort: Pulmonary effort is normal.     Breath sounds: Normal breath sounds.  Abdominal:     Palpations: Abdomen is soft. There is  no hepatomegaly, splenomegaly or mass.     Tenderness: There is no abdominal tenderness.  Musculoskeletal:     Right lower leg: No edema.     Left lower leg: No edema.  Lymphadenopathy:     Cervical: No cervical adenopathy.     Right cervical: No superficial, deep or posterior cervical adenopathy.    Left cervical: No superficial, deep or posterior cervical adenopathy.     Upper Body:     Right upper body: No supraclavicular or axillary adenopathy.     Left upper body: No supraclavicular or axillary adenopathy.  Neurological:     General: No focal deficit present.     Mental Status: She is alert and oriented to person, place, and time.  Psychiatric:        Mood and Affect: Mood normal.        Behavior: Behavior normal.     LABS:      Latest Ref Rng & Units 08/12/2023   10:31 AM 08/05/2023   12:05 PM 07/29/2023   12:54 PM  CBC  WBC 4.0 - 10.5 K/uL 4.9  4.0  4.1   Hemoglobin 12.0 - 15.0 g/dL 47.8  29.5  62.1   Hematocrit 36.0 - 46.0 % 35.3  37.0  37.6   Platelets 150 - 400 K/uL 188  171  180       Latest Ref Rng & Units 08/12/2023   10:31 AM 08/05/2023   12:05 PM 08/03/2023    8:00 AM  CMP  Glucose 70 - 99 mg/dL 308  657    BUN 8 - 23 mg/dL 35  25    Creatinine 8.46 - 1.00 mg/dL 9.62  9.52    Sodium 841 - 145 mmol/L 136  134    Potassium 3.5 - 5.1 mmol/L 4.7  4.0    Chloride 98 - 111 mmol/L 97  94    CO2 22 - 32 mmol/L 28  26    Calcium  8.9 - 10.3 mg/dL 9.6  9.5    Total Protein 6.5 - 8.1 g/dL 7.0  7.3  6.5   Total Bilirubin 0.0 - 1.2 mg/dL 0.4  0.6  0.4   Alkaline Phos 38 - 126 U/L 94  96  82   AST 15 - 41 U/L 19  16  16    ALT 0 - 44 U/L 11  11  10       No results found for: "CEA1",  "CEA" / No results found for: "CEA1", "CEA" No results found for: "PSA1" No results found for: "CAN199" No results found for: "CAN125"  Lab Results  Component Value Date   TOTALPROTELP 6.7 08/05/2023   ALBUMINELP 3.8 08/05/2023   A1GS 0.2 08/05/2023   A2GS 0.8 08/05/2023   BETS 1.0 08/05/2023   GAMS 0.8 08/05/2023   MSPIKE 0.4 (H) 08/05/2023   SPEI Comment 08/05/2023   Lab Results  Component Value Date   TIBC 110 (L) 12/18/2022   TIBC 140 (L) 12/16/2021   TIBC 142 (L) 08/12/2021   FERRITIN 277 12/18/2022   FERRITIN 272 12/16/2021   FERRITIN 312 (H) 08/12/2021   IRONPCTSAT 36 (H) 12/18/2022   IRONPCTSAT 39 (H) 12/16/2021   IRONPCTSAT 65 (H) 08/12/2021   Lab Results  Component Value Date   LDH 120 10/21/2021   LDH 129 10/14/2021   LDH 136 10/07/2020     STUDIES:   No results found.

## 2023-08-12 NOTE — Progress Notes (Signed)
 Message received from C.Edwards LPN/Dr. Katragadda to proceed with treatment. Dialysis patient. Per treatment conditions, disregard creatinine value. Creatinine 7.23.

## 2023-08-12 NOTE — Patient Instructions (Signed)
 [facility]  Discharge Instructions  You were seen and examined today by Dr. Cheree Cords.  Dr. Cheree Cords discussed your most recent lab work and CT scan which revealed that everything looks good and stable.   You will receive your Velcade  today.  Follow-up as scheduled.    Thank you for choosing [facility] to provide your oncology and hematology care.   To afford each patient quality time with our provider, please arrive at least 15 minutes before your scheduled appointment time. You may need to reschedule your appointment if you arrive late (10 or more minutes). Arriving late affects you and other patients whose appointments are after yours.  Also, if you miss three or more appointments without notifying the office, you may be dismissed from the clinic at the provider's discretion.    Again, thank you for choosing [facility].  Our hope is that these requests will decrease the amount of time that you wait before being seen by our physicians.   If you have a lab appointment with the Cancer Center - please note that after April 8th, all labs will be drawn in the cancer center.  You do not have to check in or register with the main entrance as you have in the past but will complete your check-in at the cancer center.            _____________________________________________________________  Should you have questions after your visit to [facility], please contact our office at [phone] and follow the prompts.  Our office hours are 8:00 a.m. to 4:30 p.m. Monday - Thursday and 8:00 a.m. to 2:30 p.m. Friday.  Please note that voicemails left after 4:00 p.m. may not be returned until the following business day.  We are closed weekends and all major holidays.  You do have access to a nurse 24-7, just call the main number to the clinic [phone] and do not press any options, hold on the line and a nurse will  answer the phone.    For prescription refill requests, have your pharmacy contact our office and allow 72 hours.    Masks are no longer required in the cancer centers. If you would like for your care team to wear a mask while they are taking care of you, please let them know. You may have one support person who is at least 86 years old accompany you for your appointments.

## 2023-08-12 NOTE — Progress Notes (Signed)
Treatment given per orders. Patient tolerated it well without problems. Vitals stable and discharged home from clinic via wheelchair Follow up as scheduled.  

## 2023-08-13 ENCOUNTER — Other Ambulatory Visit: Payer: Self-pay

## 2023-08-13 DIAGNOSIS — N186 End stage renal disease: Secondary | ICD-10-CM | POA: Diagnosis not present

## 2023-08-13 DIAGNOSIS — Z992 Dependence on renal dialysis: Secondary | ICD-10-CM | POA: Diagnosis not present

## 2023-08-16 ENCOUNTER — Ambulatory Visit (HOSPITAL_COMMUNITY)
Admission: RE | Admit: 2023-08-16 | Discharge: 2023-08-16 | Disposition: A | Discharge status: Skilled Nursing Facility | Attending: Vascular Surgery | Admitting: Vascular Surgery

## 2023-08-16 ENCOUNTER — Encounter (HOSPITAL_COMMUNITY)
Admission: RE | Disposition: A | Discharge status: Skilled Nursing Facility | Payer: Self-pay | Source: Home / Self Care | Attending: Vascular Surgery

## 2023-08-16 ENCOUNTER — Other Ambulatory Visit: Payer: Self-pay

## 2023-08-16 DIAGNOSIS — N186 End stage renal disease: Secondary | ICD-10-CM

## 2023-08-16 DIAGNOSIS — T8241XA Breakdown (mechanical) of vascular dialysis catheter, initial encounter: Secondary | ICD-10-CM | POA: Insufficient documentation

## 2023-08-16 DIAGNOSIS — I120 Hypertensive chronic kidney disease with stage 5 chronic kidney disease or end stage renal disease: Secondary | ICD-10-CM | POA: Insufficient documentation

## 2023-08-16 DIAGNOSIS — E1122 Type 2 diabetes mellitus with diabetic chronic kidney disease: Secondary | ICD-10-CM | POA: Diagnosis not present

## 2023-08-16 DIAGNOSIS — Y839 Surgical procedure, unspecified as the cause of abnormal reaction of the patient, or of later complication, without mention of misadventure at the time of the procedure: Secondary | ICD-10-CM | POA: Diagnosis not present

## 2023-08-16 DIAGNOSIS — T82898A Other specified complication of vascular prosthetic devices, implants and grafts, initial encounter: Secondary | ICD-10-CM | POA: Diagnosis not present

## 2023-08-16 DIAGNOSIS — Z992 Dependence on renal dialysis: Secondary | ICD-10-CM | POA: Insufficient documentation

## 2023-08-16 HISTORY — PX: TUNNELLED CATHETER EXCHANGE: CATH118373

## 2023-08-16 LAB — GLUCOSE, CAPILLARY: Glucose-Capillary: 83 mg/dL (ref 70–99)

## 2023-08-16 SURGERY — TUNNELLED CATHETER EXCHANGE
Anesthesia: LOCAL

## 2023-08-16 MED ORDER — HEPARIN (PORCINE) IN NACL 1000-0.9 UT/500ML-% IV SOLN
INTRAVENOUS | Status: DC | PRN
  Administered 2023-08-16: 500 mL

## 2023-08-16 MED ORDER — HEPARIN SODIUM (PORCINE) 1000 UNIT/ML IJ SOLN
INTRAMUSCULAR | Status: DC | PRN
  Administered 2023-08-16 (×2): 1900 [IU] via INTRAVENOUS

## 2023-08-16 MED ORDER — HEPARIN SODIUM (PORCINE) 1000 UNIT/ML IJ SOLN
INTRAMUSCULAR | Status: AC
  Filled 2023-08-16: qty 10

## 2023-08-16 MED ORDER — LIDOCAINE HCL (PF) 1 % IJ SOLN
INTRAMUSCULAR | Status: AC
  Filled 2023-08-16: qty 30

## 2023-08-16 MED ORDER — LIDOCAINE HCL (PF) 1 % IJ SOLN
INTRAMUSCULAR | Status: DC | PRN
  Administered 2023-08-16: 10 mL via SUBCUTANEOUS

## 2023-08-16 SURGICAL SUPPLY — 4 items
CATH PALINDROME-P 23 W/VT (CATHETERS) IMPLANT
GLIDEWIRE STIFF .35X180X3 HYDR (WIRE) IMPLANT
MAT PREVALON FULL STRYKER (MISCELLANEOUS) IMPLANT
TRAY PV CATH (CUSTOM PROCEDURE TRAY) ×1 IMPLANT

## 2023-08-16 NOTE — Op Note (Signed)
 DATE OF SERVICE: 08/16/2023  PATIENT:  Carly Wood  88 y.o. female  PRE-OPERATIVE DIAGNOSIS:  ESRD  POST-OPERATIVE DIAGNOSIS:  Same  PROCEDURE:   Tunneled dialysis catheter exchange with fluoroscopic guidance (CPT [phone])  SURGEON:  Surgeons and Role:    * Carlene Che, MD - Primary  ASSISTANT: none  ANESTHESIA:   local  EBL: minimal  BLOOD ADMINISTERED:none  DRAINS: none   LOCAL MEDICATIONS USED:  LIDOCAINE    SPECIMEN:  none  COUNTS: confirmed correct.  TOURNIQUET:  none  PATIENT DISPOSITION:  PACU - hemodynamically stable.   Delay start of Pharmacological VTE agent (>24hrs) due to surgical blood loss or risk of bleeding: no  INDICATION FOR PROCEDURE: Carly Wood is a 88 y.o. female with ESRD. She is catheter dependent. She is experiencing catheter dysfunction. After careful discussion of risks, benefits, and alternatives the patient was offered catheter exchange. The patient understood and wished to proceed.  OPERATIVE FINDINGS: successful exchange of 23cm LIJ TDC.  DESCRIPTION OF PROCEDURE: After identification of the patient in the pre-operative holding area, the patient was transferred to the operating room. The patient was positioned supine on the operating room table. Anesthesia was induced. The left neck was prepped and draped in standard fashion. A surgical pause was performed confirming correct patient, procedure, and operative location.  Generous infiltration of 1% lidocaine  was made around the catheter and cuff.  Scar between the cuff and subcutaneous tissue was broken with scissor.  Once the catheter was free a zip wire was introduced into the inferior vena cava.  The catheter was removed over the wire.  Manual pressure was held to the internal jugular vein.  Stylette's were introduced into the new catheter and the catheter exchanged over a wire under fluoroscopic guidance.  The catheter was tested and found to function well.  The catheter was sutured to  the skin.  Dermabond was applied to the catheter exit site.  Upon completion of the case instrument and sharps counts were confirmed correct. The patient was transferred to the PACU in good condition. I was present for all portions of the procedure.  FOLLOW UP PLAN: Follow-up with [facility] as needed.  Heber Little. Edgardo Goodwill, MD [facility] [place] [facility] Phone Number: [phone] 08/16/2023 8:07 AM

## 2023-08-16 NOTE — H&P (Signed)
 [facility] [place]  ASSESSMENT / PLAN: 88 y.o. female with left internal jugular tunneled dialysis catheter dysfunction. Plan dialysis catheter exchange today in cath lab.   CHIEF COMPLAINT: dialysis catheter dysfunction  HISTORY OF PRESENT ILLNESS: Carly Wood is a 88 y.o. female with ESRD on HD via LIJ TDC. She is catheter dependent. Her last dialysis treatment was on Friday, but she was not able to complete it, per her report. She was last seen in our office in 2022. At that time, she was offered a thigh graft, but her medical power of attorney was not interested in any further permanent dialysis access surgeries.   Past Medical History:  Diagnosis Date   Anemia     chronic macrocytic anemia   Anxiety    Chronic kidney disease    Chronic renal disease, stage 4, severely decreased glomerular filtration rate (GFR) between 15-29 mL/min/1.73 square meter (HCC) 08/22/2015   Complication of anesthesia    delirious after Breast Surgery   Dementia (HCC)    mild   Depression    Diabetes mellitus with ESRD (end-stage renal disease) (HCC)    type II   Dysphagia    Dyspnea    with activity   GERD (gastroesophageal reflux disease)    Glaucoma    Hyperlipidemia    Hypertension    Multiple myeloma (HCC)    Pneumonia    Stage 1 infiltrating ductal carcinoma of right female breast (HCC) 08/21/2015   ER+ PR+ HER 2 neu + (3+) T1cN0     Past Surgical History:  Procedure Laterality Date   A/V FISTULAGRAM Right 07/05/2020   Procedure: A/V Fistulagram;  Surgeon: Richrd Char, MD;  [facility];  Service: Cardiovascular;  Laterality: Right;   AV FISTULA PLACEMENT Left 11/22/2017   Procedure: ARTERIOVENOUS (AV) FISTULA CREATION LEFT ARM;  Surgeon: Richrd Char, MD;  Location: [facility] OR;  Service: Vascular;  Laterality: Left;   AV FISTULA PLACEMENT Right 04/04/2020   Procedure: RIGHT ARM ARTERIOVENOUS FISTULA CREATION;  Surgeon: Mayo Speck,  MD;  Location: [facility]S;  Service: Vascular;  Laterality: Right;   AV FISTULA PLACEMENT Right 08/20/2020   Procedure: ARTERIOVENOUS (AV) FISTULA LIGATION RIGHT ARM;  Surgeon: Richrd Char, MD;  Location: [facility] OR;  Service: Vascular;  Laterality: Right;   BIOPSY  08/07/2016   Procedure: BIOPSY;  Surgeon: Suzette Espy, MD;  [facility];  Service: Endoscopy;;  gastric ulcer biopsy   COLONOSCOPY     ESOPHAGOGASTRODUODENOSCOPY N/A 08/07/2016   LA Grade A esophagitis s/p dilation, small hiatal hernia, multiple gastric ulcers and erosions, duodenal erosions s/p biopsy. Negative H.pylori    ESOPHAGOGASTRODUODENOSCOPY N/A 11/27/2016   normal esophagus, previously noted gastric ulcers completely healed, normal duodenum.    ESOPHAGOGASTRODUODENOSCOPY (EGD) WITH PROPOFOL  N/A 07/23/2020   Procedure: ESOPHAGOGASTRODUODENOSCOPY (EGD) WITH PROPOFOL ;  Surgeon: Ruby Corporal, MD;  [facility];  Service: Endoscopy;  Laterality: N/A;   FISTULA SUPERFICIALIZATION Left 02/14/2018   Procedure: FISTULA SUPERFICIALIZATION LEFT ARM;  Surgeon: Dannis Dy, MD;  Location: [facility] OR;  Service: Vascular;  Laterality: Left;   FRACTURE SURGERY Right    ankle   HOT HEMOSTASIS  07/23/2020   Procedure: HOT HEMOSTASIS (ARGON PLASMA COAGULATION/BICAP);  Surgeon: Ruby Corporal, MD;  [facility];  Service: Endoscopy;;   INTRAMEDULLARY (IM) NAIL INTERTROCHANTERIC Right 07/12/2020   Procedure: INTRAMEDULLARY (IM) NAIL INTERTROCHANTRIC;  Surgeon: Tonita Frater, MD;  Location: [facility];  Service: Orthopedics;  Laterality: Right;   IR FLUORO GUIDE CV LINE LEFT  12/21/2022   IR US  GUIDE VASC ACCESS LEFT  12/21/2022   MALONEY DILATION N/A 08/07/2016   Procedure: Londa Rival DILATION;  Surgeon: Suzette Espy, MD;  [facility];  Service: Endoscopy;  Laterality: N/A;   MASTECTOMY, PARTIAL Right    PERIPHERAL VASCULAR BALLOON ANGIOPLASTY Left 07/13/2019   Procedure: PERIPHERAL  VASCULAR BALLOON ANGIOPLASTY;  Surgeon: Young Hensen, MD;  [facility];  Service: Cardiovascular;  Laterality: Left;  arm fistulogram   PERIPHERAL VASCULAR BALLOON ANGIOPLASTY Right 05/22/2020   Procedure: PERIPHERAL VASCULAR BALLOON ANGIOPLASTY;  Surgeon: Carlene Che, MD;  [facility];  Service: Cardiovascular;  Laterality: Right;  arm fistula   PERIPHERAL VASCULAR BALLOON ANGIOPLASTY Right 07/05/2020   Procedure: PERIPHERAL VASCULAR BALLOON ANGIOPLASTY;  Surgeon: Richrd Char, MD;  [facility];  Service: Cardiovascular;  Laterality: Right;  arm fistula   PORT-A-CATH REMOVAL Left 11/22/2017   Procedure: REMOVAL PORT-A-CATH LEFT CHEST;  Surgeon: Richrd Char, MD;  Location: [facility] OR;  Service: Vascular;  Laterality: Left;   RETINAL DETACHMENT SURGERY Right    SCLEROTHERAPY  07/23/2020   Procedure: SCLEROTHERAPY;  Surgeon: Ruby Corporal, MD;  [facility];  Service: Endoscopy;;   UPPER EXTREMITY VENOGRAPHY N/A 08/22/2020   Procedure: LEFT UPPER & CENTRAL VENOGRAPHY;  Surgeon: Young Hensen, MD;  [facility];  Service: Cardiovascular;  Laterality: N/A;    Family History  Problem Relation Age of Onset   Multiple myeloma Sister    Brain cancer Sister    Dementia Mother        died at 64   Stroke Mother    Heart failure Mother    Diabetes Mother    Heart disease Father    Prostate cancer Brother    Colon cancer Neg Hx     Social History   Socioeconomic History   Marital status: Single    Spouse name: Not on file   Number of children: Not on file   Years of education: Not on file   Highest education level: Not on file  Occupational History   Occupation: retired   Tobacco Use   Smoking status: Never   Smokeless tobacco: Never  Vaping Use   Vaping status: Never Used  Substance and Sexual Activity   Alcohol  use: No    Alcohol /week: 0.0 standard drinks of alcohol    Drug  use: No   Sexual activity: Never  Other Topics Concern   Not on file  Social History Narrative   Long term resident of SNF    Social Drivers of Health   Financial Resource Strain: Low Risk  (01/09/2020)   Overall Financial Resource Strain (CARDIA)    Difficulty of Paying Living Expenses: Not very hard  Food Insecurity: Patient Unable To Answer (05/24/2023)   Hunger Vital Sign    Worried About Running Out of Food in the Last Year: Patient unable to answer    Ran Out of Food in the Last Year: Patient unable to answer  Transportation Needs: Patient Unable To Answer (05/24/2023)   PRAPARE - Transportation    Lack of Transportation (Medical): Patient unable to answer    Lack of Transportation (Non-Medical): Patient unable to answer  Physical Activity: Inactive (01/09/2020)   Exercise Vital Sign    Days of Exercise per Week: 0 days    Minutes of Exercise per Session:  0 min  Stress: No Stress Concern Present (01/09/2020)   [employer] of Occupational Health - Occupational Stress Questionnaire    Feeling of Stress : Not at all  Social Connections: Patient Unable To Answer (05/24/2023)   Social Connection and Isolation Panel [NHANES]    Frequency of Communication with Friends and Family: Patient unable to answer    Frequency of Social Gatherings with Friends and Family: Patient unable to answer    Attends Religious Services: Patient unable to answer    Active Member of Clubs or Organizations: Patient unable to answer    Attends Banker Meetings: Patient unable to answer    Marital Status: Patient unable to answer  Intimate Partner Violence: Patient Unable To Answer (05/24/2023)   Humiliation, Afraid, Rape, and Kick questionnaire    Fear of Current or Ex-Partner: Patient unable to answer    Emotionally Abused: Patient unable to answer    Physically Abused: Patient unable to answer    Sexually Abused: Patient unable to answer    Allergies  Allergen Reactions   Ace  Inhibitors Other (See Comments) and Cough    Tongue swelling Angioedema    Angiotensin Receptor Blockers Other (See Comments)    Angioedema with ACE-I   Penicillins Other (See Comments)    Unknown reaction    No current facility-administered medications for this encounter.   Facility-Administered Medications Ordered in Other Encounters  Medication Dose Route Frequency Provider Last Rate Last Admin   lanreotide acetate  (SOMATULINE DEPOT ) 120 MG/0.5ML injection            lanreotide acetate  (SOMATULINE DEPOT ) 120 MG/0.5ML injection            lanreotide acetate  (SOMATULINE DEPOT ) 120 MG/0.5ML injection            lanreotide acetate  (SOMATULINE DEPOT ) 120 MG/0.5ML injection            octreotide  (SANDOSTATIN  LAR) 30 MG IM injection            octreotide  (SANDOSTATIN  LAR) 30 MG IM injection             PHYSICAL EXAM Vitals:   08/16/23 0715  BP: (!) 149/83  Pulse: 74  Resp: 14  SpO2: 97%   Elderly woman LIJ TDC in place  PERTINENT LABORATORY AND RADIOLOGIC DATA  Most recent CBC    Latest Ref Rng & Units 08/12/2023   10:31 AM 08/05/2023   12:05 PM 07/29/2023   12:54 PM  CBC  WBC 4.0 - 10.5 K/uL 4.9  4.0  4.1   Hemoglobin 12.0 - 15.0 g/dL 09.8  11.9  14.7   Hematocrit 36.0 - 46.0 % 35.3  37.0  37.6   Platelets 150 - 400 K/uL 188  171  180      Most recent CMP    Latest Ref Rng & Units 08/12/2023   10:31 AM 08/05/2023   12:05 PM 08/03/2023    8:00 AM  CMP  Glucose 70 - 99 mg/dL 829  562    BUN 8 - 23 mg/dL 35  25    Creatinine 1.30 - 1.00 mg/dL 8.65  7.84    Sodium 696 - 145 mmol/L 136  134    Potassium 3.5 - 5.1 mmol/L 4.7  4.0    Chloride 98 - 111 mmol/L 97  94    CO2 22 - 32 mmol/L 28  26    Calcium  8.9 - 10.3 mg/dL 9.6  9.5    Total Protein 6.5 -  8.1 g/dL 7.0  7.3  6.5   Total Bilirubin 0.0 - 1.2 mg/dL 0.4  0.6  0.4   Alkaline Phos 38 - 126 U/L 94  96  82   AST 15 - 41 U/L 19  16  16    ALT 0 - 44 U/L 11  11  10      Renal function Estimated Creatinine  Clearance: 5.3 mL/min (A) (by C-G formula based on SCr of 7.23 mg/dL (H)).  Hgb A1c MFr Bld (%)  Date Value  04/01/2023 4.9    LDL Cholesterol  Date Value Ref Range Status  08/03/2023 40 0 - 99 mg/dL Final    Comment:           Total Cholesterol/HDL:CHD Risk Coronary Heart Disease Risk Table                     Men   Women  1/2 Average Risk   3.4   3.3  Average Risk       5.0   4.4  2 X Average Risk   9.6   7.1  3 X Average Risk  23.4   11.0        Use the calculated Patient Ratio above and the CHD Risk Table to determine the patient's CHD Risk.        ATP III CLASSIFICATION (LDL):  <100     mg/dL   Optimal  478-295  mg/dL   Near or Above                    Optimal  130-159  mg/dL   Borderline  621-308  mg/dL   High  >657     mg/dL   Very High Performed at [facility], [address] [place], [place] [address]     Heber Little. Edgardo Goodwill, MD FACS [facility] Phone Number: [phone] 08/16/2023 7:35 AM   Total time spent on preparing this encounter including chart review, data review, collecting history, examining the patient, and coordinating care: 45 minutes.  Portions of this report may have been transcribed using voice recognition software.  Every effort has been made to ensure accuracy; however, inadvertent computerized transcription errors may still be present.

## 2023-08-17 ENCOUNTER — Encounter (HOSPITAL_COMMUNITY): Payer: Self-pay | Admitting: Vascular Surgery

## 2023-08-18 DIAGNOSIS — N186 End stage renal disease: Secondary | ICD-10-CM | POA: Diagnosis not present

## 2023-08-18 DIAGNOSIS — Z992 Dependence on renal dialysis: Secondary | ICD-10-CM | POA: Diagnosis not present

## 2023-08-19 ENCOUNTER — Inpatient Hospital Stay

## 2023-08-19 VITALS — BP 138/75 | HR 87 | Temp 98.0°F | Resp 18 | Wt 153.2 lb

## 2023-08-19 DIAGNOSIS — Z5112 Encounter for antineoplastic immunotherapy: Secondary | ICD-10-CM | POA: Diagnosis not present

## 2023-08-19 DIAGNOSIS — Z170 Estrogen receptor positive status [ER+]: Secondary | ICD-10-CM | POA: Diagnosis not present

## 2023-08-19 DIAGNOSIS — Z79811 Long term (current) use of aromatase inhibitors: Secondary | ICD-10-CM | POA: Diagnosis not present

## 2023-08-19 DIAGNOSIS — C9000 Multiple myeloma not having achieved remission: Secondary | ICD-10-CM | POA: Diagnosis not present

## 2023-08-19 DIAGNOSIS — C50411 Malignant neoplasm of upper-outer quadrant of right female breast: Secondary | ICD-10-CM | POA: Diagnosis not present

## 2023-08-19 DIAGNOSIS — Z9011 Acquired absence of right breast and nipple: Secondary | ICD-10-CM | POA: Diagnosis not present

## 2023-08-19 LAB — COMPREHENSIVE METABOLIC PANEL WITH GFR
ALT: 11 U/L (ref 0–44)
AST: 18 U/L (ref 15–41)
Albumin: 3.8 g/dL (ref 3.5–5.0)
Alkaline Phosphatase: 93 U/L (ref 38–126)
Anion gap: 15 (ref 5–15)
BUN: 27 mg/dL — ABNORMAL HIGH (ref 8–23)
CO2: 28 mmol/L (ref 22–32)
Calcium: 9.6 mg/dL (ref 8.9–10.3)
Chloride: 94 mmol/L — ABNORMAL LOW (ref 98–111)
Creatinine, Ser: 6.36 mg/dL — ABNORMAL HIGH (ref 0.44–1.00)
GFR, Estimated: 6 mL/min — ABNORMAL LOW (ref >60)
Glucose, Bld: 97 mg/dL (ref 70–99)
Potassium: 4.4 mmol/L (ref 3.5–5.1)
Sodium: 137 mmol/L (ref 135–145)
Total Bilirubin: 0.4 mg/dL (ref 0.0–1.2)
Total Protein: 7.7 g/dL (ref 6.5–8.1)

## 2023-08-19 LAB — CBC WITH DIFFERENTIAL/PLATELET
Abs Immature Granulocytes: 0 10*3/uL (ref 0.00–0.07)
Basophils Absolute: 0.1 10*3/uL (ref 0.0–0.1)
Basophils Relative: 1 %
Eosinophils Absolute: 0.2 10*3/uL (ref 0.0–0.5)
Eosinophils Relative: 4 %
HCT: 34.5 % — ABNORMAL LOW (ref 36.0–46.0)
Hemoglobin: 10.6 g/dL — ABNORMAL LOW (ref 12.0–15.0)
Lymphocytes Relative: 21 %
Lymphs Abs: 1.1 10*3/uL (ref 0.7–4.0)
MCH: 31.9 pg (ref 26.0–34.0)
MCHC: 30.7 g/dL (ref 30.0–36.0)
MCV: 103.9 fL — ABNORMAL HIGH (ref 80.0–100.0)
Monocytes Absolute: 0.3 10*3/uL (ref 0.1–1.0)
Monocytes Relative: 5 %
Neutro Abs: 3.5 10*3/uL (ref 1.7–7.7)
Neutrophils Relative %: 69 %
Platelets: 174 10*3/uL (ref 150–400)
RBC: 3.32 MIL/uL — ABNORMAL LOW (ref 3.87–5.11)
RDW: 16.5 % — ABNORMAL HIGH (ref 11.5–15.5)
WBC: 5 10*3/uL (ref 4.0–10.5)
nRBC: 0 % (ref 0.0–0.2)

## 2023-08-19 LAB — MAGNESIUM: Magnesium: 2.3 mg/dL (ref 1.7–2.4)

## 2023-08-19 MED ORDER — PROCHLORPERAZINE MALEATE 10 MG PO TABS
10.0000 mg | ORAL_TABLET | Freq: Once | ORAL | Status: AC
  Administered 2023-08-19: 10 mg via ORAL
  Filled 2023-08-19: qty 1

## 2023-08-19 MED ORDER — BORTEZOMIB CHEMO SQ INJECTION 3.5 MG (2.5MG/ML)
1.2000 mg/m2 | Freq: Once | INTRAMUSCULAR | Status: AC
  Administered 2023-08-19: 2 mg via SUBCUTANEOUS
  Filled 2023-08-19: qty 0.8

## 2023-08-19 NOTE — Progress Notes (Signed)
 Patient presents today for Velcade  injection per providers order.  Vital signs and labs with in order parameters for treatment.  No new complaints at this time.  Stable during administration without incident; injection site WNL; see MAR for injection details.  Patient tolerated procedure well and without incident.  No questions or complaints noted at this time.

## 2023-08-19 NOTE — Patient Instructions (Signed)
 [facility] [place] - [facility]  Discharge Instructions: Thank you for choosing [facility] to provide your oncology and hematology care.  If you have a lab appointment with the Cancer Center - please note that after April 8th, 2024, all labs will be drawn in the cancer center.  You do not have to check in or register with the main entrance as you have in the past but will complete your check-in in the cancer center.  Wear comfortable clothing and clothing appropriate for easy access to any Portacath or PICC line.   We strive to give you quality time with your provider. You may need to reschedule your appointment if you arrive late (15 or more minutes).  Arriving late affects you and other patients whose appointments are after yours.  Also, if you miss three or more appointments without notifying the office, you may be dismissed from the clinic at the provider's discretion.      For prescription refill requests, have your pharmacy contact our office and allow 72 hours for refills to be completed.    Today you received the following chemotherapy and/or immunotherapy agents Velcade      To help prevent nausea and vomiting after your treatment, we encourage you to take your nausea medication as directed.  BELOW ARE SYMPTOMS THAT SHOULD BE REPORTED IMMEDIATELY: *FEVER GREATER THAN 100.4 F (38 C) OR HIGHER *CHILLS OR SWEATING *NAUSEA AND VOMITING THAT IS NOT CONTROLLED WITH YOUR NAUSEA MEDICATION *UNUSUAL SHORTNESS OF BREATH *UNUSUAL BRUISING OR BLEEDING *URINARY PROBLEMS (pain or burning when urinating, or frequent urination) *BOWEL PROBLEMS (unusual diarrhea, constipation, pain near the anus) TENDERNESS IN MOUTH AND THROAT WITH OR WITHOUT PRESENCE OF ULCERS (sore throat, sores in mouth, or a toothache) UNUSUAL RASH, SWELLING OR PAIN  UNUSUAL VAGINAL DISCHARGE OR ITCHING   Items with * indicate a potential emergency and should be followed up  as soon as possible or go to the Emergency Department if any problems should occur.  Please show the CHEMOTHERAPY ALERT CARD or IMMUNOTHERAPY ALERT CARD at check-in to the Emergency Department and triage nurse.  Should you have questions after your visit or need to cancel or reschedule your appointment, please contact [facility] [place] - A DEPT OF [facility] [phone]  and follow the prompts.  Office hours are 8:00 a.m. to 4:30 p.m. Monday - Friday. Please note that voicemails left after 4:00 p.m. may not be returned until the following business day.  We are closed weekends and major holidays. You have access to a nurse at all times for urgent questions. Please call the main number to the clinic [phone] and follow the prompts.  For any non-urgent questions, you may also contact your provider using MyChart. We now offer e-Visits for anyone 44 and older to request care online for non-urgent symptoms. For details visit [email or web address].   Also download the MyChart app! Go to the app store, search "MyChart", open the app, select [place], and log in with your MyChart username and password.

## 2023-08-20 DIAGNOSIS — Z992 Dependence on renal dialysis: Secondary | ICD-10-CM | POA: Diagnosis not present

## 2023-08-20 DIAGNOSIS — N186 End stage renal disease: Secondary | ICD-10-CM | POA: Diagnosis not present

## 2023-08-23 ENCOUNTER — Other Ambulatory Visit: Payer: Self-pay

## 2023-08-23 DIAGNOSIS — I739 Peripheral vascular disease, unspecified: Secondary | ICD-10-CM | POA: Diagnosis not present

## 2023-08-23 DIAGNOSIS — E1140 Type 2 diabetes mellitus with diabetic neuropathy, unspecified: Secondary | ICD-10-CM | POA: Diagnosis not present

## 2023-08-23 DIAGNOSIS — Z992 Dependence on renal dialysis: Secondary | ICD-10-CM | POA: Diagnosis not present

## 2023-08-23 DIAGNOSIS — L602 Onychogryphosis: Secondary | ICD-10-CM | POA: Diagnosis not present

## 2023-08-23 DIAGNOSIS — N186 End stage renal disease: Secondary | ICD-10-CM | POA: Diagnosis not present

## 2023-08-25 DIAGNOSIS — N186 End stage renal disease: Secondary | ICD-10-CM | POA: Diagnosis not present

## 2023-08-25 DIAGNOSIS — Z992 Dependence on renal dialysis: Secondary | ICD-10-CM | POA: Diagnosis not present

## 2023-08-26 ENCOUNTER — Encounter: Payer: Self-pay | Admitting: Adult Health

## 2023-08-26 ENCOUNTER — Non-Acute Institutional Stay (SKILLED_NURSING_FACILITY): Payer: Self-pay | Admitting: Adult Health

## 2023-08-26 ENCOUNTER — Inpatient Hospital Stay

## 2023-08-26 VITALS — BP 134/76 | HR 80 | Temp 97.6°F | Resp 20

## 2023-08-26 DIAGNOSIS — Z992 Dependence on renal dialysis: Secondary | ICD-10-CM

## 2023-08-26 DIAGNOSIS — I120 Hypertensive chronic kidney disease with stage 5 chronic kidney disease or end stage renal disease: Secondary | ICD-10-CM

## 2023-08-26 DIAGNOSIS — E1122 Type 2 diabetes mellitus with diabetic chronic kidney disease: Secondary | ICD-10-CM | POA: Diagnosis not present

## 2023-08-26 DIAGNOSIS — C9000 Multiple myeloma not having achieved remission: Secondary | ICD-10-CM | POA: Diagnosis not present

## 2023-08-26 DIAGNOSIS — Z79811 Long term (current) use of aromatase inhibitors: Secondary | ICD-10-CM | POA: Diagnosis not present

## 2023-08-26 DIAGNOSIS — N186 End stage renal disease: Secondary | ICD-10-CM | POA: Diagnosis not present

## 2023-08-26 DIAGNOSIS — Z5112 Encounter for antineoplastic immunotherapy: Secondary | ICD-10-CM | POA: Diagnosis not present

## 2023-08-26 DIAGNOSIS — E1169 Type 2 diabetes mellitus with other specified complication: Secondary | ICD-10-CM

## 2023-08-26 DIAGNOSIS — C50411 Malignant neoplasm of upper-outer quadrant of right female breast: Secondary | ICD-10-CM | POA: Diagnosis not present

## 2023-08-26 DIAGNOSIS — E785 Hyperlipidemia, unspecified: Secondary | ICD-10-CM

## 2023-08-26 DIAGNOSIS — Z9011 Acquired absence of right breast and nipple: Secondary | ICD-10-CM | POA: Diagnosis not present

## 2023-08-26 DIAGNOSIS — Z170 Estrogen receptor positive status [ER+]: Secondary | ICD-10-CM | POA: Diagnosis not present

## 2023-08-26 LAB — MAGNESIUM: Magnesium: 2.3 mg/dL (ref 1.7–2.4)

## 2023-08-26 LAB — CBC WITH DIFFERENTIAL/PLATELET
Abs Immature Granulocytes: 0.01 10*3/uL (ref 0.00–0.07)
Basophils Absolute: 0 10*3/uL (ref 0.0–0.1)
Basophils Relative: 1 %
Eosinophils Absolute: 0.2 10*3/uL (ref 0.0–0.5)
Eosinophils Relative: 5 %
HCT: 32.8 % — ABNORMAL LOW (ref 36.0–46.0)
Hemoglobin: 10.5 g/dL — ABNORMAL LOW (ref 12.0–15.0)
Immature Granulocytes: 0 %
Lymphocytes Relative: 22 %
Lymphs Abs: 0.9 10*3/uL (ref 0.7–4.0)
MCH: 33.1 pg (ref 26.0–34.0)
MCHC: 32 g/dL (ref 30.0–36.0)
MCV: 103.5 fL — ABNORMAL HIGH (ref 80.0–100.0)
Monocytes Absolute: 0.6 10*3/uL (ref 0.1–1.0)
Monocytes Relative: 14 %
Neutro Abs: 2.3 10*3/uL (ref 1.7–7.7)
Neutrophils Relative %: 58 %
Platelets: 197 10*3/uL (ref 150–400)
RBC: 3.17 MIL/uL — ABNORMAL LOW (ref 3.87–5.11)
RDW: 15.8 % — ABNORMAL HIGH (ref 11.5–15.5)
WBC: 4 10*3/uL (ref 4.0–10.5)
nRBC: 0 % (ref 0.0–0.2)

## 2023-08-26 LAB — COMPREHENSIVE METABOLIC PANEL WITH GFR
ALT: 13 U/L (ref 0–44)
AST: 18 U/L (ref 15–41)
Albumin: 3.6 g/dL (ref 3.5–5.0)
Alkaline Phosphatase: 90 U/L (ref 38–126)
Anion gap: 12 (ref 5–15)
BUN: 24 mg/dL — ABNORMAL HIGH (ref 8–23)
CO2: 28 mmol/L (ref 22–32)
Calcium: 9.5 mg/dL (ref 8.9–10.3)
Chloride: 95 mmol/L — ABNORMAL LOW (ref 98–111)
Creatinine, Ser: 5.78 mg/dL — ABNORMAL HIGH (ref 0.44–1.00)
GFR, Estimated: 7 mL/min — ABNORMAL LOW (ref >60)
Glucose, Bld: 116 mg/dL — ABNORMAL HIGH (ref 70–99)
Potassium: 4.2 mmol/L (ref 3.5–5.1)
Sodium: 135 mmol/L (ref 135–145)
Total Bilirubin: 0.3 mg/dL (ref 0.0–1.2)
Total Protein: 7.2 g/dL (ref 6.5–8.1)

## 2023-08-26 MED ORDER — BORTEZOMIB CHEMO SQ INJECTION 3.5 MG (2.5MG/ML)
1.2000 mg/m2 | Freq: Once | INTRAMUSCULAR | Status: AC
  Administered 2023-08-26: 2 mg via SUBCUTANEOUS
  Filled 2023-08-26: qty 0.8

## 2023-08-26 MED ORDER — PROCHLORPERAZINE MALEATE 10 MG PO TABS
10.0000 mg | ORAL_TABLET | Freq: Once | ORAL | Status: AC
  Filled 2023-08-26: qty 1

## 2023-08-26 NOTE — Progress Notes (Signed)
 Patient presents today for Velcade injection.  Patient is in satisfactory condition with no new complaints voiced.  Vital signs are stable.  Labs reviewed and all labs are within treatment parameters.  We will proceed with treatment per MD orders.    Patient tolerated injection with no complaints voiced.  Site clean and dry with no bruising or swelling noted.  No complaints of pain.  Discharged with vital signs stable and no signs or symptoms of distress noted.

## 2023-08-26 NOTE — Patient Instructions (Signed)
 [facility] [place] [facility]  Discharge Instructions: Thank you for choosing [facility] to provide your oncology and hematology care.  If you have a lab appointment with the Cancer Center - please note that after April 8th, 2024, all labs will be drawn in the cancer center.  You do not have to check in or register with the main entrance as you have in the past but will complete your check-in in the cancer center.  Wear comfortable clothing and clothing appropriate for easy access to any Portacath or PICC line.   We strive to give you quality time with your provider. You may need to reschedule your appointment if you arrive late (15 or more minutes).  Arriving late affects you and other patients whose appointments are after yours.  Also, if you miss three or more appointments without notifying the office, you may be dismissed from the clinic at the provider's discretion.      For prescription refill requests, have your pharmacy contact our office and allow 72 hours for refills to be completed.    Today you received the following chemotherapy and/or immunotherapy agents Velcade.  Bortezomib Injection What is this medication? BORTEZOMIB (bor TEZ oh mib) treats lymphoma. It may also be used to treat multiple myeloma, a type of bone marrow cancer. It works by blocking a protein that causes cancer cells to grow and multiply. This helps to slow or stop the spread of cancer cells. This medicine may be used for other purposes; ask your health care provider or pharmacist if you have questions. COMMON BRAND NAME(S): BORUZU, Velcade What should I tell my care team before I take this medication? They need to know if you have any of these conditions: Dehydration Diabetes Heart disease Liver disease Tingling of the fingers or toes or other nerve disorder An unusual or allergic reaction to bortezomib, other medications, foods, dyes, or preservatives If  you or your partner are pregnant or trying to get pregnant Breastfeeding How should I use this medication? This medication is injected into a vein or under the skin. It is given by your care team in a hospital or clinic setting. Talk to your care team about the use of this medication in children. Special care may be needed. Overdosage: If you think you have taken too much of this medicine contact a poison control center or emergency room at once. NOTE: This medicine is only for you. Do not share this medicine with others. What if I miss a dose? Keep appointments for follow-up doses. It is important not to miss your dose. Call your care team if you are unable to keep an appointment. What may interact with this medication? Ketoconazole Rifampin This list may not describe all possible interactions. Give your health care provider a list of all the medicines, herbs, non-prescription drugs, or dietary supplements you use. Also tell them if you smoke, drink alcohol, or use illegal drugs. Some items may interact with your medicine. What should I watch for while using this medication? Your condition will be monitored carefully while you are receiving this medication. You may need blood work while taking this medication. This medication may affect your coordination, reaction time, or judgment. Do not drive or operate machinery until you know how this medication affects you. Sit up or stand slowly to reduce the risk of dizzy or fainting spells. Drinking alcohol with this medication can increase the risk of these side effects.  This medication may increase your risk of getting an infection. Call your care team for advice if you get a fever, chills, sore throat, or other symptoms of a cold or flu. Do not treat yourself. Try to avoid being around people who are sick. Check with your care team if you have severe diarrhea, nausea, and vomiting, or if you sweat a lot. The loss of too much body fluid may make it  dangerous for you to take this medication. Talk to your care team if you may be pregnant. Serious birth defects can occur if you take this medication during pregnancy and for 7 months after the last dose. You will need a negative pregnancy test before starting this medication. Contraception is recommended while taking this medication and for 7 months after the last dose. Your care team can help you find the option that works for you. If your partner can get pregnant, use a condom during sex while taking this medication and for 4 months after the last dose. Do not breastfeed while taking this medication and for 2 months after the last dose. This medication may cause infertility. Talk to your care team if you are concerned about your fertility. What side effects may I notice from receiving this medication? Side effects that you should report to your care team as soon as possible: Allergic reactions--skin rash, itching, hives, swelling of the face, lips, tongue, or throat Bleeding--bloody or black, tar-like stools, vomiting blood or [name] material that looks like coffee grounds, red or dark [name] urine, small red or purple spots on skin, unusual bruising or bleeding Bleeding in the brain--severe headache, stiff neck, confusion, dizziness, change in vision, numbness or weakness of the face, arm, or leg, trouble speaking, trouble walking, vomiting Bowel blockage--stomach cramping, unable to have a bowel movement or pass gas, loss of appetite, vomiting Heart failure--shortness of breath, swelling of the ankles, feet, or hands, sudden weight gain, unusual weakness or fatigue Infection--fever, chills, cough, sore throat, wounds that don't heal, pain or trouble when passing urine, general feeling of discomfort or being unwell Liver injury--right upper belly pain, loss of appetite, nausea, light-colored stool, dark yellow or [name] urine, yellowing skin or eyes, unusual weakness or fatigue Low blood  pressure--dizziness, feeling faint or lightheaded, blurry vision Lung injury--shortness of breath or trouble breathing, cough, spitting up blood, chest pain, fever Pain, tingling, or numbness in the hands or feet Severe or prolonged diarrhea Stomach pain, bloody diarrhea, pale skin, unusual weakness or fatigue, decrease in the amount of urine, which may be signs of hemolytic uremic syndrome Sudden and severe headache, confusion, change in vision, seizures, which may be signs of posterior reversible encephalopathy syndrome (PRES) TTP--purple spots on the skin or inside the mouth, pale skin, yellowing skin or eyes, unusual weakness or fatigue, fever, fast or irregular heartbeat, confusion, change in vision, trouble speaking, trouble walking Tumor lysis syndrome (TLS)--nausea, vomiting, diarrhea, decrease in the amount of urine, dark urine, unusual weakness or fatigue, confusion, muscle pain or cramps, fast or irregular heartbeat, joint pain Side effects that usually do not require medical attention (report to your care team if they continue or are bothersome): Constipation Diarrhea Fatigue Loss of appetite Nausea This list may not describe all possible side effects. Call your doctor for medical advice about side effects. You may report side effects to FDA at 1-800-FDA-1088. Where should I keep my medication? This medication is given in a hospital or clinic. It will not be stored at home. NOTE: This sheet  is a summary. It may not cover all possible information. If you have questions about this medicine, talk to your doctor, pharmacist, or health care provider.  2024 Elsevier/Gold Standard (2021-07-29 00:00:00)       To help prevent nausea and vomiting after your treatment, we encourage you to take your nausea medication as directed.  BELOW ARE SYMPTOMS THAT SHOULD BE REPORTED IMMEDIATELY: *FEVER GREATER THAN 100.4 F (38 C) OR HIGHER *CHILLS OR SWEATING *NAUSEA AND VOMITING THAT IS NOT  CONTROLLED WITH YOUR NAUSEA MEDICATION *UNUSUAL SHORTNESS OF BREATH *UNUSUAL BRUISING OR BLEEDING *URINARY PROBLEMS (pain or burning when urinating, or frequent urination) *BOWEL PROBLEMS (unusual diarrhea, constipation, pain near the anus) TENDERNESS IN MOUTH AND THROAT WITH OR WITHOUT PRESENCE OF ULCERS (sore throat, sores in mouth, or a toothache) UNUSUAL RASH, SWELLING OR PAIN  UNUSUAL VAGINAL DISCHARGE OR ITCHING   Items with * indicate a potential emergency and should be followed up as soon as possible or go to the Emergency Department if any problems should occur.  Please show the CHEMOTHERAPY ALERT CARD or IMMUNOTHERAPY ALERT CARD at check-in to the Emergency Department and triage nurse.  Should you have questions after your visit or need to cancel or reschedule your appointment, please contact [facility]ER CTR  - A DEPT OF Eligha Bridegroom [facility] [phone]  and follow the prompts.  Office hours are 8:00 a.m. to 4:30 p.m. Monday - Friday. Please note that voicemails left after 4:00 p.m. may not be returned until the following business day.  We are closed weekends and major holidays. You have access to a nurse at all times for urgent questions. Please call the main number to the clinic [phone] and follow the prompts.  For any non-urgent questions, you may also contact your provider using MyChart. We now offer e-Visits for anyone 29 and older to request care online for non-urgent symptoms. For details visit [email or web address].   Also download the MyChart app! Go to the app store, search "MyChart", open the app, select [place], and log in with your MyChart username and password.

## 2023-08-27 DIAGNOSIS — Z992 Dependence on renal dialysis: Secondary | ICD-10-CM | POA: Diagnosis not present

## 2023-08-27 DIAGNOSIS — N186 End stage renal disease: Secondary | ICD-10-CM | POA: Diagnosis not present

## 2023-08-30 DIAGNOSIS — N186 End stage renal disease: Secondary | ICD-10-CM | POA: Diagnosis not present

## 2023-08-30 DIAGNOSIS — E119 Type 2 diabetes mellitus without complications: Secondary | ICD-10-CM | POA: Diagnosis not present

## 2023-08-30 DIAGNOSIS — Z992 Dependence on renal dialysis: Secondary | ICD-10-CM | POA: Diagnosis not present

## 2023-08-31 ENCOUNTER — Other Ambulatory Visit: Payer: Self-pay

## 2023-08-31 NOTE — Progress Notes (Signed)
 Location:  [facility] Room Number: 149 Place of Service:  SNF (31)   CODE STATUS: dnr   Allergies  Allergen Reactions   Ace Inhibitors Other (See Comments) and Cough    Tongue swelling Angioedema    Angiotensin Receptor Blockers Other (See Comments)    Angioedema with ACE-I   Penicillins Other (See Comments)    Unknown reaction    Chief Complaint  Patient presents with   Medical Management of Chronic Issues          Chronic kidney disease with end stage renal disease on dialysis due to type 2 diabetes mellitus/dependence on dialysis: Hypertension associated with end stage renal disease (ESRD) due to type 2 diabetes mellitus: Hyperlipidemia associated with type 2 diabetes mellitus    HPI:  She is a 88 y.o. long term resident of this facility being seen for the management of her chronic illnesses:Chronic kidney disease with end stage renal disease on dialysis due to type 2 diabetes mellitus/dependence on dialysis: Hypertension associated with end stage renal disease (ESRD) due to type 2 diabetes mellitus: Hyperlipidemia associated with type 2 diabetes mellitus. She continues on dialysis three days weekly. There are no reports of uncontrolled pain. Her weight remains stable.    Past Medical History:  Diagnosis Date   Anemia     chronic macrocytic anemia   Anxiety    Chronic kidney disease    Chronic renal disease, stage 4, severely decreased glomerular filtration rate (GFR) between 15-29 mL/min/1.73 square meter (HCC) 08/22/2015   Complication of anesthesia    delirious after Breast Surgery   Dementia (HCC)    mild   Depression    Diabetes mellitus with ESRD (end-stage renal disease) (HCC)    type II   Dysphagia    Dyspnea    with activity   GERD (gastroesophageal reflux disease)    Glaucoma    Hyperlipidemia    Hypertension    Multiple myeloma (HCC)    Pneumonia    Stage 1 infiltrating ductal carcinoma of right female breast (HCC) 08/21/2015    ER+ PR+ HER 2 neu + (3+) T1cN0     Past Surgical History:  Procedure Laterality Date   A/V FISTULAGRAM Right 07/05/2020   Procedure: A/V Fistulagram;  Surgeon: Harvey Carlin BRAVO, MD;  Location: [facility];  Service: Cardiovascular;  Laterality: Right;   AV FISTULA PLACEMENT Left 11/22/2017   Procedure: ARTERIOVENOUS (AV) FISTULA CREATION LEFT ARM;  Surgeon: Harvey Carlin BRAVO, MD;  Location: [facility] OR;  Service: Vascular;  Laterality: Left;   AV FISTULA PLACEMENT Right 04/04/2020   Procedure: RIGHT ARM ARTERIOVENOUS FISTULA CREATION;  Surgeon: Oris Krystal FALCON, MD;  Location: [facility];  Service: Vascular;  Laterality: Right;   AV FISTULA PLACEMENT Right 08/20/2020   Procedure: ARTERIOVENOUS (AV) FISTULA LIGATION RIGHT ARM;  Surgeon: Harvey Carlin BRAVO, MD;  Location: [facility] OR;  Service: Vascular;  Laterality: Right;   BIOPSY  08/07/2016   Procedure: BIOPSY;  Surgeon: Shaaron [name], MD;  Location: [facility];  Service: Endoscopy;;  gastric ulcer biopsy   COLONOSCOPY     ESOPHAGOGASTRODUODENOSCOPY N/A 08/07/2016   LA Grade A esophagitis s/p dilation, small hiatal hernia, multiple gastric ulcers and erosions, duodenal erosions s/p biopsy. Negative H.pylori    ESOPHAGOGASTRODUODENOSCOPY N/A 11/27/2016   normal esophagus, previously noted gastric ulcers completely healed, normal duodenum.    ESOPHAGOGASTRODUODENOSCOPY (EGD) WITH PROPOFOL  N/A 07/23/2020   Procedure: ESOPHAGOGASTRODUODENOSCOPY (EGD) WITH PROPOFOL ;  Surgeon: Golda Claudis PENNER, MD;  Location: [facility];  Service: Endoscopy;  Laterality: N/A;   FISTULA SUPERFICIALIZATION Left 02/14/2018   Procedure: FISTULA SUPERFICIALIZATION LEFT ARM;  Surgeon: Eliza Lonni RAMAN, MD;  Location: [facility] OR;  Service: Vascular;  Laterality: Left;   FRACTURE SURGERY Right    ankle   HOT HEMOSTASIS  07/23/2020   Procedure: HOT HEMOSTASIS (ARGON PLASMA COAGULATION/BICAP);  Surgeon: Golda Claudis PENNER, MD;  [facility]: [facility];  Service:  Endoscopy;;   INTRAMEDULLARY (IM) NAIL INTERTROCHANTERIC Right 07/12/2020   Procedure: INTRAMEDULLARY (IM) NAIL INTERTROCHANTRIC;  Surgeon: Onesimo Oneil LABOR, MD;  Location: [facility];  Service: Orthopedics;  Laterality: Right;   IR FLUORO GUIDE CV LINE LEFT  12/21/2022   IR US  GUIDE VASC ACCESS LEFT  12/21/2022   MALONEY DILATION N/A 08/07/2016   Procedure: AGAPITO DILATION;  Surgeon: Shaaron [name], MD;  [facility];  Service: Endoscopy;  Laterality: N/A;   MASTECTOMY, PARTIAL Right    PERIPHERAL VASCULAR BALLOON ANGIOPLASTY Left 07/13/2019   Procedure: PERIPHERAL VASCULAR BALLOON ANGIOPLASTY;  Surgeon: Gretta Lonni PARAS, MD;  Location: [facility];  Service: Cardiovascular;  Laterality: Left;  arm fistulogram   PERIPHERAL VASCULAR BALLOON ANGIOPLASTY Right 05/22/2020   Procedure: PERIPHERAL VASCULAR BALLOON ANGIOPLASTY;  Surgeon: Magda Debby SAILOR, MD;  Location: [facility];  Service: Cardiovascular;  Laterality: Right;  arm fistula   PERIPHERAL VASCULAR BALLOON ANGIOPLASTY Right 07/05/2020   Procedure: PERIPHERAL VASCULAR BALLOON ANGIOPLASTY;  Surgeon: Harvey Carlin BRAVO, MD;  Location: [facility];  Service: Cardiovascular;  Laterality: Right;  arm fistula   PORT-A-CATH REMOVAL Left 11/22/2017   Procedure: REMOVAL PORT-A-CATH LEFT CHEST;  Surgeon: Harvey Carlin BRAVO, MD;  Location[facility] OR;  Service: Vascular;  Laterality: Left;   RETINAL DETACHMENT SURGERY Right    SCLEROTHERAPY  07/23/2020   Procedure: SCLEROTHERAPY;  Surgeon: Golda Claudis PENNER, MD;  [facility];  Service: Endoscopy;;   TUNNELLED CATHETER EXCHANGE N/A 08/16/2023   Procedure: TUNNELLED CATHETER EXCHANGE;  Surgeon: Magda Debby SAILOR, MD;  Location: [facility];  Service: Cardiovascular;  Laterality: N/A;   UPPER EXTREMITY VENOGRAPHY N/A 08/22/2020   Procedure: LEFT UPPER & CENTRAL VENOGRAPHY;  Surgeon: Gretta Lonni PARAS, MD;  [facility]: [facility];  Service: Cardiovascular;   Laterality: N/A;    Social History   Socioeconomic History   Marital status: Single    Spouse name: Not on file   Number of children: Not on file   Years of education: Not on file   Highest education level: Not on file  Occupational History   Occupation: retired   Tobacco Use   Smoking status: Never   Smokeless tobacco: Never  Vaping Use   Vaping status: Never Used  Substance and Sexual Activity   Alcohol  use: No    Alcohol /week: 0.0 standard drinks of alcohol    Drug use: No   Sexual activity: Never  Other Topics Concern   Not on file  Social History Narrative   Long term resident of SNF    Social Drivers of Health   Financial Resource Strain: Low Risk  (01/09/2020)   Overall Financial Resource Strain (CARDIA)    Difficulty of Paying Living Expenses: Not very hard  Food Insecurity: Patient Unable To Answer (05/24/2023)   Hunger Vital Sign    Worried About Running Out of Food in the Last Year: Patient unable to answer    Ran Out of Food in the Last Year: Patient unable to answer  Transportation Needs:  Patient Unable To Answer (05/24/2023)   PRAPARE - Transportation    Lack of Transportation (Medical): Patient unable to answer    Lack of Transportation (Non-Medical): Patient unable to answer  Physical Activity: Inactive (01/09/2020)   Exercise Vital Sign    Days of Exercise per Week: 0 days    Minutes of Exercise per Session: 0 min  Stress: No Stress Concern Present (01/09/2020)   [employer] of Occupational Health - Occupational Stress Questionnaire    Feeling of Stress : Not at all  Social Connections: Patient Unable To Answer (05/24/2023)   Social Connection and Isolation Panel    Frequency of Communication with Friends and Family: Patient unable to answer    Frequency of Social Gatherings with Friends and Family: Patient unable to answer    Attends Religious Services: Patient unable to answer    Active Member of Clubs or Organizations: Patient unable to answer     Attends Banker Meetings: Patient unable to answer    Marital Status: Patient unable to answer  Intimate Partner Violence: Patient Unable To Answer (05/24/2023)   Humiliation, Afraid, Rape, and Kick questionnaire    Fear of Current or Ex-Partner: Patient unable to answer    Emotionally Abused: Patient unable to answer    Physically Abused: Patient unable to answer    Sexually Abused: Patient unable to answer   Family History  Problem Relation Age of Onset   Multiple myeloma Sister    Brain cancer Sister    Dementia Mother        died at 57   Stroke Mother    Heart failure Mother    Diabetes Mother    Heart disease Father    Prostate cancer Brother    Colon cancer Neg Hx       VITAL SIGNS BP (!) 144/78   Pulse 99   Temp 98.9 F (37.2 C)   Resp (!) 22   Ht 5' 3 (1.6 m)   Wt 149 lb (67.6 kg)   SpO2 100%   BMI 26.39 kg/m   Outpatient Encounter Medications as of 08/26/2023  Medication Sig   acetaminophen  (TYLENOL ) 325 MG tablet Take 650 mg by mouth 3 (three) times daily.   acyclovir  (ZOVIRAX ) 200 MG capsule Take 200 mg by mouth in the morning. (0800)   anastrozole  (ARIMIDEX ) 1 MG tablet TAKE 1 TABLET BY MOUTH DAILY   aspirin  EC 81 MG tablet Take 1 tablet (81 mg total) by mouth daily. Swallow whole.   atorvastatin  (LIPITOR) 40 MG tablet Take 1 tablet (40 mg total) by mouth daily.   Calcium  Carb-Cholecalciferol  (CALCIUM  + VITAMIN D3) 500-5 MG-MCG TABS Take 1 tablet by mouth daily.   melatonin 5 MG TABS Take 5 mg by mouth at bedtime.   multivitamin (RENA-VIT) TABS tablet Take 1 tablet by mouth at bedtime.   Nutritional Supplements (ENSURE ENLIVE PO) Take 237 mLs by mouth every evening.   ondansetron  (ZOFRAN -ODT) 4 MG disintegrating tablet Take 4 mg by mouth daily before breakfast.   pantoprazole  (PROTONIX ) 40 MG tablet Take 40 mg by mouth 2 (two) times daily.   predniSONE  (DELTASONE ) 50 MG tablet Take 1 tablet (50 mg total) by mouth daily with breakfast.    sennosides-docusate sodium (SENOKOT-S) 8.6-50 MG tablet Take 1 tablet by mouth 2 (two) times daily.   sertraline  (ZOLOFT ) 25 MG tablet Take 25 mg by mouth daily. Was taking 50 mg but it stopped on 05-25-2023   sevelamer  carbonate (RENVELA ) 800 MG tablet  Take 800 mg by mouth daily.   sucralfate  (CARAFATE ) 1 GM/10ML suspension Take 10 mLs by mouth 2 (two) times daily.      SIGNIFICANT DIAGNOSTIC EXAMS  LABS REVIEWED PREVIOUS    09-15-22: wbc 5.2; hgb 9.9; hct 31.0; mcv 103.3 plt 224; glucose 132; bun 22; creat 6.19; k+ 4.2; na++ 135; ca 9.5 gfr 6 protein 6.9 albumin  3.7     11-10-22: wbc 4.0; hgb 10.7; hct 33.4; mcv 102.1 plt 187; glucose 110; bun 28; creat 6.16; k+ 4.8; na++ 130; ca 9.0; gfr 6; protein 6.8 albumin  3.6 mag 2.4  12-16-22: wbc 6.7; hgb 12.4; hct 38.3; mcv 101.3 plt 201; glucose 123; bun 22; creat 4.81; k+ 4.1; na++ 133; ca 9.4; gfr 8; protein 7.9; albumin  4.1; blood culture: staphylococcus hominis  12-22-22: wbc 4.2; hgb 9.8; hct 30.1; mcv 100.0; plt 184; glucose 90; bun 81; creat 12.02; k+ 5.0; na++ 136; ca 8.8; gfr 3; phos 10.8; albumin  3.2 02-16-23: wbc 4.9; hgb 9.2; hct 28.8; mcv 104.7 plt 215 glucose 194; bun 32; creat 8.17; k+ 5.1; na++ 9.8;gfr 4; protein 7.5 albumin  3.9 mag 2.6   05-27-23: uric acid 3.4 07-01-23: wbc 4.1; hgb 11.2; hct 35.2; mcv 101.4 plt 197;glucose 145; bun 22; creat 6.07; k+ 3.8; na++ 135; ca 9.4 gfr 6 protein 7.2 albumin  3.6 mag 2.4   TODAY  08-26-23: wbc 4.0; hgb 10.5; hct 32.8; mcv 103.5 plt 197; glucose 116; bun 24; creat 5.78; k+ 4.2; na++ 135; ca 9.5 gfr 7; protein 7.2 albumin  3.6 mag 2.3   Review of Systems  Constitutional:  Negative for malaise/fatigue.  Respiratory:  Negative for cough and shortness of breath.   Cardiovascular:  Negative for chest pain, palpitations and leg swelling.  Gastrointestinal:  Negative for abdominal pain, constipation and heartburn.  Musculoskeletal:  Negative for back pain, joint pain and myalgias.  Skin: Negative.    Neurological:  Negative for dizziness.  Psychiatric/Behavioral:  The patient is not nervous/anxious.    Physical Exam Constitutional:      General: She is not in acute distress.    Appearance: She is well-developed. She is not diaphoretic.  Neck:     Thyroid : No thyromegaly.   Cardiovascular:     Rate and Rhythm: Normal rate and regular rhythm.     Pulses: Normal pulses.     Heart sounds: Normal heart sounds.  Pulmonary:     Effort: Pulmonary effort is normal. No respiratory distress.     Breath sounds: Normal breath sounds.  Abdominal:     General: Bowel sounds are normal. There is no distension.     Palpations: Abdomen is soft.     Tenderness: There is no abdominal tenderness.   Musculoskeletal:        General: Normal range of motion.     Cervical back: Neck supple.     Right lower leg: No edema.     Left lower leg: No edema.  Lymphadenopathy:     Cervical: No cervical adenopathy.   Skin:    General: Skin is warm and dry.     Comments: Tunneled dialysis access   chest wall       Neurological:     Mental Status: She is alert. Mental status is at baseline.   Psychiatric:        Mood and Affect: Mood normal.     ASSESSMENT/PLAN  TODAY  Chronic kidney disease with end stage renal disease on dialysis due to type 2 diabetes mellitus/dependence on dialysis: will  continue dialysis three times weekly; renvela  800 mg daily with 1200 fluid restriction  2. Hypertension associated with end stage renal disease (ESRD) due to type 2 diabetes mellitus: b/p 144/78 will monitor   3. Hyperlipidemia associated with type 2 diabetes mellitus ldl 89 id off lipitor had elevated liver enzymes.   PREVIOUS    4. Aortic atherosclerosis/atherosclerotic peripheral vascular disease (ct 10-04-18) ldl 89  5. Vascular dementia with behavioral disturbance: weight is 149 pounds and stable.   6. Diabetes mellitus type 2 with end stage renal disease (ESRD) hgb A1c 5.1 is currently off  medications  7. Anemia due to end stage renal disease; hgb 10.5  8. Post menopausal osteoporosis: t score -2.957 will continue supplements   9. Stage 1 infiltrating ductal carcinoma of female right breast / s/p right mastectomy will continue arimidex  1 mg daily   10. Multiple myeloma not having achieved remission: is followed by oncology  11. Herpes: without outbreak will continue acyclovir  200 mg daily   12. Major depression chronic recurrent: will continue zoloft  25 mg daily   13. Protein calorie malnutrition: protein 7.2; albumin  3.6; will continue supplements as directed  14. Gastroesophageal  reflux disease without esophagitis: will continue protonix  40 mg twice daily carafate  1 gm two times daily; zofran  4 mg prior to breakfast  15. Generalized chronic pain: will continue tylenol  650 mg three times daily    Barnie Seip NP [facility]   call [phone]

## 2023-09-01 DIAGNOSIS — Z992 Dependence on renal dialysis: Secondary | ICD-10-CM | POA: Diagnosis not present

## 2023-09-01 DIAGNOSIS — N186 End stage renal disease: Secondary | ICD-10-CM | POA: Diagnosis not present

## 2023-09-02 ENCOUNTER — Inpatient Hospital Stay

## 2023-09-02 VITALS — BP 140/80 | HR 78 | Temp 97.1°F | Resp 20

## 2023-09-02 DIAGNOSIS — Z79811 Long term (current) use of aromatase inhibitors: Secondary | ICD-10-CM | POA: Diagnosis not present

## 2023-09-02 DIAGNOSIS — C50411 Malignant neoplasm of upper-outer quadrant of right female breast: Secondary | ICD-10-CM | POA: Diagnosis not present

## 2023-09-02 DIAGNOSIS — Z170 Estrogen receptor positive status [ER+]: Secondary | ICD-10-CM | POA: Diagnosis not present

## 2023-09-02 DIAGNOSIS — C9000 Multiple myeloma not having achieved remission: Secondary | ICD-10-CM

## 2023-09-02 DIAGNOSIS — Z9011 Acquired absence of right breast and nipple: Secondary | ICD-10-CM | POA: Diagnosis not present

## 2023-09-02 DIAGNOSIS — Z5112 Encounter for antineoplastic immunotherapy: Secondary | ICD-10-CM | POA: Diagnosis not present

## 2023-09-02 LAB — CBC WITH DIFFERENTIAL/PLATELET
Abs Immature Granulocytes: 0.01 10*3/uL (ref 0.00–0.07)
Basophils Absolute: 0 10*3/uL (ref 0.0–0.1)
Basophils Relative: 0 %
Eosinophils Absolute: 0.2 10*3/uL (ref 0.0–0.5)
Eosinophils Relative: 4 %
HCT: 31 % — ABNORMAL LOW (ref 36.0–46.0)
Hemoglobin: 10.1 g/dL — ABNORMAL LOW (ref 12.0–15.0)
Immature Granulocytes: 0 %
Lymphocytes Relative: 20 %
Lymphs Abs: 1 10*3/uL (ref 0.7–4.0)
MCH: 33.8 pg (ref 26.0–34.0)
MCHC: 32.6 g/dL (ref 30.0–36.0)
MCV: 103.7 fL — ABNORMAL HIGH (ref 80.0–100.0)
Monocytes Absolute: 0.5 10*3/uL (ref 0.1–1.0)
Monocytes Relative: 11 %
Neutro Abs: 3.2 10*3/uL (ref 1.7–7.7)
Neutrophils Relative %: 65 %
Platelets: 188 10*3/uL (ref 150–400)
RBC: 2.99 MIL/uL — ABNORMAL LOW (ref 3.87–5.11)
RDW: 15.7 % — ABNORMAL HIGH (ref 11.5–15.5)
WBC: 5 10*3/uL (ref 4.0–10.5)
nRBC: 0 % (ref 0.0–0.2)

## 2023-09-02 LAB — COMPREHENSIVE METABOLIC PANEL WITH GFR
ALT: 10 U/L (ref 0–44)
AST: 18 U/L (ref 15–41)
Albumin: 3.6 g/dL (ref 3.5–5.0)
Alkaline Phosphatase: 83 U/L (ref 38–126)
Anion gap: 14 (ref 5–15)
BUN: 20 mg/dL (ref 8–23)
CO2: 27 mmol/L (ref 22–32)
Calcium: 9.5 mg/dL (ref 8.9–10.3)
Chloride: 98 mmol/L (ref 98–111)
Creatinine, Ser: 5.65 mg/dL — ABNORMAL HIGH (ref 0.44–1.00)
GFR, Estimated: 7 mL/min — ABNORMAL LOW (ref >60)
Glucose, Bld: 90 mg/dL (ref 70–99)
Potassium: 4.1 mmol/L (ref 3.5–5.1)
Sodium: 139 mmol/L (ref 135–145)
Total Bilirubin: 0.3 mg/dL (ref 0.0–1.2)
Total Protein: 7.2 g/dL (ref 6.5–8.1)

## 2023-09-02 LAB — MAGNESIUM: Magnesium: 2.3 mg/dL (ref 1.7–2.4)

## 2023-09-02 MED ORDER — BORTEZOMIB CHEMO SQ INJECTION 3.5 MG (2.5MG/ML)
1.2000 mg/m2 | Freq: Once | INTRAMUSCULAR | Status: AC
  Administered 2023-09-02: 2 mg via SUBCUTANEOUS
  Filled 2023-09-02: qty 0.8

## 2023-09-02 MED ORDER — PROCHLORPERAZINE MALEATE 10 MG PO TABS
10.0000 mg | ORAL_TABLET | Freq: Once | ORAL | Status: AC
Start: 2023-09-02 — End: 2023-09-02
  Administered 2023-09-02: 10 mg via ORAL
  Filled 2023-09-02: qty 1

## 2023-09-02 NOTE — Patient Instructions (Signed)
 [facility] [place] [facility]  Discharge Instructions: Thank you for choosing [facility] to provide your oncology and hematology care.  If you have a lab appointment with the Cancer Center - please note that after April 8th, 2024, all labs will be drawn in the cancer center.  You do not have to check in or register with the main entrance as you have in the past but will complete your check-in in the cancer center.  Wear comfortable clothing and clothing appropriate for easy access to any Portacath or PICC line.   We strive to give you quality time with your provider. You may need to reschedule your appointment if you arrive late (15 or more minutes).  Arriving late affects you and other patients whose appointments are after yours.  Also, if you miss three or more appointments without notifying the office, you may be dismissed from the clinic at the provider's discretion.      For prescription refill requests, have your pharmacy contact our office and allow 72 hours for refills to be completed.    Today you received the following chemotherapy and/or immunotherapy agents velcade     To help prevent nausea and vomiting after your treatment, we encourage you to take your nausea medication as directed.  BELOW ARE SYMPTOMS THAT SHOULD BE REPORTED IMMEDIATELY: *FEVER GREATER THAN 100.4 F (38 C) OR HIGHER *CHILLS OR SWEATING *NAUSEA AND VOMITING THAT IS NOT CONTROLLED WITH YOUR NAUSEA MEDICATION *UNUSUAL SHORTNESS OF BREATH *UNUSUAL BRUISING OR BLEEDING *URINARY PROBLEMS (pain or burning when urinating, or frequent urination) *BOWEL PROBLEMS (unusual diarrhea, constipation, pain near the anus) TENDERNESS IN MOUTH AND THROAT WITH OR WITHOUT PRESENCE OF ULCERS (sore throat, sores in mouth, or a toothache) UNUSUAL RASH, SWELLING OR PAIN  UNUSUAL VAGINAL DISCHARGE OR ITCHING   Items with * indicate a potential emergency and should be followed up as  soon as possible or go to the Emergency Department if any problems should occur.  Please show the CHEMOTHERAPY ALERT CARD or IMMUNOTHERAPY ALERT CARD at check-in to the Emergency Department and triage nurse.  Should you have questions after your visit or need to cancel or reschedule your appointment, please contact [facility] CANCER CTR [place] - A DEPT OF Eligha Bridegroom [facility] [phone]  and follow the prompts.  Office hours are 8:00 a.m. to 4:30 p.m. Monday - Friday. Please note that voicemails left after 4:00 p.m. may not be returned until the following business day.  We are closed weekends and major holidays. You have access to a nurse at all times for urgent questions. Please call the main number to the clinic [phone] and follow the prompts.  For any non-urgent questions, you may also contact your provider using MyChart. We now offer e-Visits for anyone 62 and older to request care online for non-urgent symptoms. For details visit [email or web address].   Also download the MyChart app! Go to the app store, search "MyChart", open the app, select [place], and log in with your MyChart username and password.

## 2023-09-02 NOTE — Progress Notes (Signed)
Patient tolerated Velcade injection with no complaints voiced.  Lab work reviewed.  See MAR for details.  Injection site clean and dry with no bruising or swelling noted.  Patient stable during and after injection.  Band aid applied.  VSS.  Patient left in satisfactory condition with no s/s of distress noted. All follow ups as scheduled.  [employer]

## 2023-09-03 DIAGNOSIS — N186 End stage renal disease: Secondary | ICD-10-CM | POA: Diagnosis not present

## 2023-09-03 DIAGNOSIS — Z992 Dependence on renal dialysis: Secondary | ICD-10-CM | POA: Diagnosis not present

## 2023-09-06 DIAGNOSIS — Z992 Dependence on renal dialysis: Secondary | ICD-10-CM | POA: Diagnosis not present

## 2023-09-06 DIAGNOSIS — N186 End stage renal disease: Secondary | ICD-10-CM | POA: Diagnosis not present

## 2023-09-07 ENCOUNTER — Other Ambulatory Visit: Payer: Self-pay

## 2023-09-07 DIAGNOSIS — Z992 Dependence on renal dialysis: Secondary | ICD-10-CM | POA: Diagnosis not present

## 2023-09-07 DIAGNOSIS — N186 End stage renal disease: Secondary | ICD-10-CM | POA: Diagnosis not present

## 2023-09-08 DIAGNOSIS — N186 End stage renal disease: Secondary | ICD-10-CM | POA: Diagnosis not present

## 2023-09-08 DIAGNOSIS — Z992 Dependence on renal dialysis: Secondary | ICD-10-CM | POA: Diagnosis not present

## 2023-09-09 ENCOUNTER — Inpatient Hospital Stay: Attending: Hematology

## 2023-09-09 ENCOUNTER — Inpatient Hospital Stay

## 2023-09-09 VITALS — BP 142/82 | HR 81 | Temp 98.2°F | Resp 18

## 2023-09-09 DIAGNOSIS — C50411 Malignant neoplasm of upper-outer quadrant of right female breast: Secondary | ICD-10-CM | POA: Insufficient documentation

## 2023-09-09 DIAGNOSIS — C9000 Multiple myeloma not having achieved remission: Secondary | ICD-10-CM | POA: Insufficient documentation

## 2023-09-09 DIAGNOSIS — Z992 Dependence on renal dialysis: Secondary | ICD-10-CM | POA: Diagnosis not present

## 2023-09-09 DIAGNOSIS — G629 Polyneuropathy, unspecified: Secondary | ICD-10-CM | POA: Insufficient documentation

## 2023-09-09 DIAGNOSIS — Z5112 Encounter for antineoplastic immunotherapy: Secondary | ICD-10-CM | POA: Diagnosis present

## 2023-09-09 DIAGNOSIS — Z79811 Long term (current) use of aromatase inhibitors: Secondary | ICD-10-CM | POA: Insufficient documentation

## 2023-09-09 LAB — CBC WITH DIFFERENTIAL/PLATELET
Abs Immature Granulocytes: 0.02 10*3/uL (ref 0.00–0.07)
Basophils Absolute: 0 10*3/uL (ref 0.0–0.1)
Basophils Relative: 1 %
Eosinophils Absolute: 0.3 10*3/uL (ref 0.0–0.5)
Eosinophils Relative: 7 %
HCT: 32.4 % — ABNORMAL LOW (ref 36.0–46.0)
Hemoglobin: 10.4 g/dL — ABNORMAL LOW (ref 12.0–15.0)
Immature Granulocytes: 1 %
Lymphocytes Relative: 20 %
Lymphs Abs: 0.7 10*3/uL (ref 0.7–4.0)
MCH: 33.7 pg (ref 26.0–34.0)
MCHC: 32.1 g/dL (ref 30.0–36.0)
MCV: 104.9 fL — ABNORMAL HIGH (ref 80.0–100.0)
Monocytes Absolute: 0.5 10*3/uL (ref 0.1–1.0)
Monocytes Relative: 12 %
Neutro Abs: 2.2 10*3/uL (ref 1.7–7.7)
Neutrophils Relative %: 59 %
Platelets: 175 10*3/uL (ref 150–400)
RBC: 3.09 MIL/uL — ABNORMAL LOW (ref 3.87–5.11)
RDW: 16.1 % — ABNORMAL HIGH (ref 11.5–15.5)
WBC: 3.7 10*3/uL — ABNORMAL LOW (ref 4.0–10.5)
nRBC: 0 % (ref 0.0–0.2)

## 2023-09-09 LAB — MAGNESIUM: Magnesium: 2.3 mg/dL (ref 1.7–2.4)

## 2023-09-09 LAB — COMPREHENSIVE METABOLIC PANEL WITH GFR
ALT: 12 U/L (ref 0–44)
AST: 16 U/L (ref 15–41)
Albumin: 3.5 g/dL (ref 3.5–5.0)
Alkaline Phosphatase: 80 U/L (ref 38–126)
Anion gap: 14 (ref 5–15)
BUN: 21 mg/dL (ref 8–23)
CO2: 27 mmol/L (ref 22–32)
Calcium: 9 mg/dL (ref 8.9–10.3)
Chloride: 96 mmol/L — ABNORMAL LOW (ref 98–111)
Creatinine, Ser: 5.79 mg/dL — ABNORMAL HIGH (ref 0.44–1.00)
GFR, Estimated: 7 mL/min — ABNORMAL LOW (ref >60)
Glucose, Bld: 154 mg/dL — ABNORMAL HIGH (ref 70–99)
Potassium: 4.1 mmol/L (ref 3.5–5.1)
Sodium: 137 mmol/L (ref 135–145)
Total Bilirubin: 0.4 mg/dL (ref 0.0–1.2)
Total Protein: 7 g/dL (ref 6.5–8.1)

## 2023-09-09 MED ORDER — BORTEZOMIB CHEMO SQ INJECTION 3.5 MG (2.5MG/ML)
1.2000 mg/m2 | Freq: Once | INTRAMUSCULAR | Status: AC
  Administered 2023-09-09: 2 mg via SUBCUTANEOUS
  Filled 2023-09-09: qty 0.8

## 2023-09-09 MED ORDER — PROCHLORPERAZINE MALEATE 10 MG PO TABS
10.0000 mg | ORAL_TABLET | Freq: Once | ORAL | Status: AC
  Administered 2023-09-09: 10 mg via ORAL
  Filled 2023-09-09: qty 1

## 2023-09-09 NOTE — Patient Instructions (Signed)
 [facility] [place] - A DEPT OF [facility]  Discharge Instructions: Thank you for choosing [facility] to provide your oncology and hematology care.  If you have a lab appointment with the Cancer Center - please note that after April 8th, 2024, all labs will be drawn in the cancer center.  You do not have to check in or register with the main entrance as you have in the past but will complete your check-in in the cancer center.  Wear comfortable clothing and clothing appropriate for easy access to any Portacath or PICC line.   We strive to give you quality time with your provider. You may need to reschedule your appointment if you arrive late (15 or more minutes).  Arriving late affects you and other patients whose appointments are after yours.  Also, if you miss three or more appointments without notifying the office, you may be dismissed from the clinic at the provider's discretion.      For prescription refill requests, have your pharmacy contact our office and allow 72 hours for refills to be completed.    Today you received the following chemotherapy and/or immunotherapy agents Velcade, return as scheduled.   To help prevent nausea and vomiting after your treatment, we encourage you to take your nausea medication as directed.  BELOW ARE SYMPTOMS THAT SHOULD BE REPORTED IMMEDIATELY: *FEVER GREATER THAN 100.4 F (38 C) OR HIGHER *CHILLS OR SWEATING *NAUSEA AND VOMITING THAT IS NOT CONTROLLED WITH YOUR NAUSEA MEDICATION *UNUSUAL SHORTNESS OF BREATH *UNUSUAL BRUISING OR BLEEDING *URINARY PROBLEMS (pain or burning when urinating, or frequent urination) *BOWEL PROBLEMS (unusual diarrhea, constipation, pain near the anus) TENDERNESS IN MOUTH AND THROAT WITH OR WITHOUT PRESENCE OF ULCERS (sore throat, sores in mouth, or a toothache) UNUSUAL RASH, SWELLING OR PAIN  UNUSUAL VAGINAL DISCHARGE OR ITCHING   Items with * indicate a potential emergency and  should be followed up as soon as possible or go to the Emergency Department if any problems should occur.  Please show the CHEMOTHERAPY ALERT CARD or IMMUNOTHERAPY ALERT CARD at check-in to the Emergency Department and triage nurse.  Should you have questions after your visit or need to cancel or reschedule your appointment, please contact [facility] [place] - A DEPT OF [facility] [phone]  and follow the prompts.  Office hours are 8:00 a.m. to 4:30 p.m. Monday - Friday. Please note that voicemails left after 4:00 p.m. may not be returned until the following business day.  We are closed weekends and major holidays. You have access to a nurse at all times for urgent questions. Please call the main number to the clinic [phone] and follow the prompts.  For any non-urgent questions, you may also contact your provider using MyChart. We now offer e-Visits for anyone 68 and older to request care online for non-urgent symptoms. For details visit [email or web address].   Also download the MyChart app! Go to the app store, search "MyChart", open the app, select [place], and log in with your MyChart username and password.

## 2023-09-09 NOTE — Progress Notes (Signed)
 Patient tolerated Velcade injection with no complaints voiced.  Lab work reviewed.  See MAR for details.  Injection site clean and dry with no bruising or swelling noted.  Patient stable during and after injection.  Band aid applied.  VSS.  Patient left in satisfactory condition with no s/s of distress noted.

## 2023-09-10 DIAGNOSIS — N186 End stage renal disease: Secondary | ICD-10-CM | POA: Diagnosis not present

## 2023-09-10 DIAGNOSIS — Z992 Dependence on renal dialysis: Secondary | ICD-10-CM | POA: Diagnosis not present

## 2023-09-13 DIAGNOSIS — N186 End stage renal disease: Secondary | ICD-10-CM | POA: Diagnosis not present

## 2023-09-13 DIAGNOSIS — Z992 Dependence on renal dialysis: Secondary | ICD-10-CM | POA: Diagnosis not present

## 2023-09-14 ENCOUNTER — Other Ambulatory Visit: Payer: Self-pay

## 2023-09-15 ENCOUNTER — Encounter: Payer: Self-pay | Admitting: Hematology

## 2023-09-15 DIAGNOSIS — N186 End stage renal disease: Secondary | ICD-10-CM | POA: Diagnosis not present

## 2023-09-15 DIAGNOSIS — Z992 Dependence on renal dialysis: Secondary | ICD-10-CM | POA: Diagnosis not present

## 2023-09-16 ENCOUNTER — Inpatient Hospital Stay

## 2023-09-16 VITALS — BP 130/82 | HR 98 | Temp 98.1°F | Resp 18 | Wt 148.0 lb

## 2023-09-16 DIAGNOSIS — C9000 Multiple myeloma not having achieved remission: Secondary | ICD-10-CM

## 2023-09-16 DIAGNOSIS — Z5112 Encounter for antineoplastic immunotherapy: Secondary | ICD-10-CM | POA: Diagnosis not present

## 2023-09-16 LAB — CBC WITH DIFFERENTIAL/PLATELET
Abs Immature Granulocytes: 0.01 K/uL (ref 0.00–0.07)
Basophils Absolute: 0 K/uL (ref 0.0–0.1)
Basophils Relative: 1 %
Eosinophils Absolute: 0.2 K/uL (ref 0.0–0.5)
Eosinophils Relative: 5 %
HCT: 32.9 % — ABNORMAL LOW (ref 36.0–46.0)
Hemoglobin: 10.6 g/dL — ABNORMAL LOW (ref 12.0–15.0)
Immature Granulocytes: 0 %
Lymphocytes Relative: 21 %
Lymphs Abs: 0.9 K/uL (ref 0.7–4.0)
MCH: 33.8 pg (ref 26.0–34.0)
MCHC: 32.2 g/dL (ref 30.0–36.0)
MCV: 104.8 fL — ABNORMAL HIGH (ref 80.0–100.0)
Monocytes Absolute: 0.5 K/uL (ref 0.1–1.0)
Monocytes Relative: 12 %
Neutro Abs: 2.7 K/uL (ref 1.7–7.7)
Neutrophils Relative %: 61 %
Platelets: 193 K/uL (ref 150–400)
RBC: 3.14 MIL/uL — ABNORMAL LOW (ref 3.87–5.11)
RDW: 15.5 % (ref 11.5–15.5)
WBC: 4.3 K/uL (ref 4.0–10.5)
nRBC: 0 % (ref 0.0–0.2)

## 2023-09-16 LAB — COMPREHENSIVE METABOLIC PANEL WITH GFR
ALT: 12 U/L (ref 0–44)
AST: 18 U/L (ref 15–41)
Albumin: 3.8 g/dL (ref 3.5–5.0)
Alkaline Phosphatase: 83 U/L (ref 38–126)
Anion gap: 16 — ABNORMAL HIGH (ref 5–15)
BUN: 26 mg/dL — ABNORMAL HIGH (ref 8–23)
CO2: 28 mmol/L (ref 22–32)
Calcium: 9.3 mg/dL (ref 8.9–10.3)
Chloride: 94 mmol/L — ABNORMAL LOW (ref 98–111)
Creatinine, Ser: 5.82 mg/dL — ABNORMAL HIGH (ref 0.44–1.00)
GFR, Estimated: 7 mL/min — ABNORMAL LOW (ref >60)
Glucose, Bld: 147 mg/dL — ABNORMAL HIGH (ref 70–99)
Potassium: 4 mmol/L (ref 3.5–5.1)
Sodium: 138 mmol/L (ref 135–145)
Total Bilirubin: 0.4 mg/dL (ref 0.0–1.2)
Total Protein: 7.5 g/dL (ref 6.5–8.1)

## 2023-09-16 LAB — MAGNESIUM: Magnesium: 2.4 mg/dL (ref 1.7–2.4)

## 2023-09-16 MED ORDER — BORTEZOMIB CHEMO SQ INJECTION 3.5 MG (2.5MG/ML)
1.2000 mg/m2 | Freq: Once | INTRAMUSCULAR | Status: AC
  Administered 2023-09-16: 2 mg via SUBCUTANEOUS
  Filled 2023-09-16: qty 0.8

## 2023-09-16 MED ORDER — PROCHLORPERAZINE MALEATE 10 MG PO TABS
10.0000 mg | ORAL_TABLET | Freq: Once | ORAL | Status: AC
  Administered 2023-09-16: 10 mg via ORAL
  Filled 2023-09-16: qty 1

## 2023-09-16 NOTE — Patient Instructions (Signed)
 [facility] [place] [facility]  Discharge Instructions: Thank you for choosing [facility] to provide your oncology and hematology care.  If you have a lab appointment with the Cancer Center - please note that after April 8th, 2024, all labs will be drawn in the cancer center.  You do not have to check in or register with the main entrance as you have in the past but will complete your check-in in the cancer center.  Wear comfortable clothing and clothing appropriate for easy access to any Portacath or PICC line.   We strive to give you quality time with your provider. You may need to reschedule your appointment if you arrive late (15 or more minutes).  Arriving late affects you and other patients whose appointments are after yours.  Also, if you miss three or more appointments without notifying the office, you may be dismissed from the clinic at the provider's discretion.      For prescription refill requests, have your pharmacy contact our office and allow 72 hours for refills to be completed.    Today you received the following chemotherapy and/or immunotherapy agents velcade     To help prevent nausea and vomiting after your treatment, we encourage you to take your nausea medication as directed.  BELOW ARE SYMPTOMS THAT SHOULD BE REPORTED IMMEDIATELY: *FEVER GREATER THAN 100.4 F (38 C) OR HIGHER *CHILLS OR SWEATING *NAUSEA AND VOMITING THAT IS NOT CONTROLLED WITH YOUR NAUSEA MEDICATION *UNUSUAL SHORTNESS OF BREATH *UNUSUAL BRUISING OR BLEEDING *URINARY PROBLEMS (pain or burning when urinating, or frequent urination) *BOWEL PROBLEMS (unusual diarrhea, constipation, pain near the anus) TENDERNESS IN MOUTH AND THROAT WITH OR WITHOUT PRESENCE OF ULCERS (sore throat, sores in mouth, or a toothache) UNUSUAL RASH, SWELLING OR PAIN  UNUSUAL VAGINAL DISCHARGE OR ITCHING   Items with * indicate a potential emergency and should be followed up as  soon as possible or go to the Emergency Department if any problems should occur.  Please show the CHEMOTHERAPY ALERT CARD or IMMUNOTHERAPY ALERT CARD at check-in to the Emergency Department and triage nurse.  Should you have questions after your visit or need to cancel or reschedule your appointment, please contact [facility] CANCER CTR [place] - A DEPT OF Eligha Bridegroom [facility] [phone]  and follow the prompts.  Office hours are 8:00 a.m. to 4:30 p.m. Monday - Friday. Please note that voicemails left after 4:00 p.m. may not be returned until the following business day.  We are closed weekends and major holidays. You have access to a nurse at all times for urgent questions. Please call the main number to the clinic [phone] and follow the prompts.  For any non-urgent questions, you may also contact your provider using MyChart. We now offer e-Visits for anyone 62 and older to request care online for non-urgent symptoms. For details visit [email or web address].   Also download the MyChart app! Go to the app store, search "MyChart", open the app, select [place], and log in with your MyChart username and password.

## 2023-09-16 NOTE — Progress Notes (Signed)
Patient tolerated Velcade injection with no complaints voiced.  Lab work reviewed.  See MAR for details.  Injection site clean and dry with no bruising or swelling noted.  Patient stable during and after injection.  Band aid applied.  VSS.  Patient left in satisfactory condition with no s/s of distress noted. All follow ups as scheduled.  [employer]

## 2023-09-17 ENCOUNTER — Encounter: Payer: Self-pay | Admitting: Adult Health

## 2023-09-17 ENCOUNTER — Non-Acute Institutional Stay (SKILLED_NURSING_FACILITY): Payer: Self-pay | Admitting: Adult Health

## 2023-09-17 DIAGNOSIS — I700 Atherosclerosis of aorta: Secondary | ICD-10-CM

## 2023-09-17 DIAGNOSIS — N186 End stage renal disease: Secondary | ICD-10-CM | POA: Diagnosis not present

## 2023-09-17 DIAGNOSIS — E1122 Type 2 diabetes mellitus with diabetic chronic kidney disease: Secondary | ICD-10-CM | POA: Diagnosis not present

## 2023-09-17 DIAGNOSIS — I350 Nonrheumatic aortic (valve) stenosis: Secondary | ICD-10-CM | POA: Diagnosis not present

## 2023-09-17 DIAGNOSIS — Z992 Dependence on renal dialysis: Secondary | ICD-10-CM | POA: Diagnosis not present

## 2023-09-17 NOTE — Progress Notes (Unsigned)
 Location:  [facility] Room Number: 149 Place of Service:  SNF [phone]) Provider:  [name], NP   [name], NP  Patient Care Team: [name], NP as PCP - General (Geriatric Medicine) [name], MD as PCP - Cardiology (Cardiology) Shaaron [name], MD as Consulting Physician (Gastroenterology) [name], MD as Medical Oncologist (Medical Oncology) [name], MD (Inactive) as Consulting Physician (Nephrology) [facility])  Extended Emergency Contact Information Primary Emergency Contact: Macmullen,Annie Address: [address]          [place], [place] [address] [place] Home Phone: [phone] Relation: Sister Secondary Emergency Contact: Johal,WAYNE Mobile Phone: [phone] Relation: Brother  Code Status:  DNR Goals of care: Advanced Directive information    09/09/2023    3:16 PM  Advanced Directives  Does Patient Have a Medical Advance Directive? No  Type of Advance Directive Healthcare Power of Attorney  Does patient want to make changes to medical advance directive? No - Patient declined  Copy of Healthcare Power of Attorney in Chart? No - copy requested  Would patient like information on creating a medical advance directive? No - Patient declined     Chief Complaint  Patient presents with   Acute Visit    HPI:  Pt is a 88 y.o. female seen today for an acute visit for    Past Medical History:  Diagnosis Date   Anemia     chronic macrocytic anemia   Anxiety    Chronic kidney disease    Chronic renal disease, stage 4, severely decreased glomerular filtration rate (GFR) between 15-29 mL/min/1.73 square meter (HCC) 08/22/2015   Complication of anesthesia    delirious after Breast Surgery   Dementia (HCC)    mild   Depression    Diabetes mellitus with ESRD (end-stage renal disease) (HCC)    type II   Dysphagia    Dyspnea    with activity   GERD  (gastroesophageal reflux disease)    Glaucoma    Hyperlipidemia    Hypertension    Multiple myeloma (HCC)    Pneumonia    Stage 1 infiltrating ductal carcinoma of right female breast (HCC) 08/21/2015   ER+ PR+ HER 2 neu + (3+) T1cN0    Past Surgical History:  Procedure Laterality Date   A/V FISTULAGRAM Right 07/05/2020   Procedure: A/V Fistulagram;  Surgeon: Harvey Carlin BRAVO, MD;  Location: [facility];  Service: Cardiovascular;  Laterality: Right;   AV FISTULA PLACEMENT Left 11/22/2017   Procedure: ARTERIOVENOUS (AV) FISTULA CREATION LEFT ARM;  Surgeon: Harvey Carlin BRAVO, MD;  Location: [facility] OR;  Service: Vascular;  Laterality: Left;   AV FISTULA PLACEMENT Right 04/04/2020   Procedure: RIGHT ARM ARTERIOVENOUS FISTULA CREATION;  Surgeon: Oris Krystal FALCON, MD;  Location: [facility];  Service: Vascular;  Laterality: Right;   AV FISTULA PLACEMENT Right 08/20/2020   Procedure: ARTERIOVENOUS (AV) FISTULA LIGATION RIGHT ARM;  Surgeon: Harvey Carlin BRAVO, MD;  Location: [facility] OR;  Service: Vascular;  Laterality: Right;   BIOPSY  08/07/2016   Procedure: BIOPSY;  Surgeon: Shaaron [name], MD;  Location: [facility];  Service: Endoscopy;;  gastric ulcer biopsy   COLONOSCOPY     ESOPHAGOGASTRODUODENOSCOPY N/A 08/07/2016   LA Grade A esophagitis s/p dilation, small hiatal hernia, multiple gastric ulcers and erosions, duodenal erosions s/p biopsy. Negative H.pylori    ESOPHAGOGASTRODUODENOSCOPY N/A 11/27/2016   normal esophagus, previously noted gastric  ulcers completely healed, normal duodenum.    ESOPHAGOGASTRODUODENOSCOPY (EGD) WITH PROPOFOL  N/A 07/23/2020   Procedure: ESOPHAGOGASTRODUODENOSCOPY (EGD) WITH PROPOFOL ;  Surgeon: Golda Claudis PENNER, MD;  [facility];  Service: Endoscopy;  Laterality: N/A;   FISTULA SUPERFICIALIZATION Left 02/14/2018   Procedure: FISTULA SUPERFICIALIZATION LEFT ARM;  Surgeon: Eliza Lonni RAMAN, MD;  Location: [facility] OR;  Service: Vascular;  Laterality: Left;    FRACTURE SURGERY Right    ankle   HOT HEMOSTASIS  07/23/2020   Procedure: HOT HEMOSTASIS (ARGON PLASMA COAGULATION/BICAP);  Surgeon: Golda Claudis PENNER, MD;  [facility];  Service: Endoscopy;;   INTRAMEDULLARY (IM) NAIL INTERTROCHANTERIC Right 07/12/2020   Procedure: INTRAMEDULLARY (IM) NAIL INTERTROCHANTRIC;  Surgeon: Onesimo Oneil LABOR, MD;  Location: [facility];  Service: Orthopedics;  Laterality: Right;   IR FLUORO GUIDE CV LINE LEFT  12/21/2022   IR US  GUIDE VASC ACCESS LEFT  12/21/2022   MALONEY DILATION N/A 08/07/2016   Procedure: AGAPITO DILATION;  Surgeon: Shaaron [name], MD;  [facility];  Service: Endoscopy;  Laterality: N/A;   MASTECTOMY, PARTIAL Right    PERIPHERAL VASCULAR BALLOON ANGIOPLASTY Left 07/13/2019   Procedure: PERIPHERAL VASCULAR BALLOON ANGIOPLASTY;  Surgeon: Gretta Lonni PARAS, MD;  Location: [facility];  Service: Cardiovascular;  Laterality: Left;  arm fistulogram   PERIPHERAL VASCULAR BALLOON ANGIOPLASTY Right 05/22/2020   Procedure: PERIPHERAL VASCULAR BALLOON ANGIOPLASTY;  Surgeon: Magda Debby SAILOR, MD;  Location: [facility];  Service: Cardiovascular;  Laterality: Right;  arm fistula   PERIPHERAL VASCULAR BALLOON ANGIOPLASTY Right 07/05/2020   Procedure: PERIPHERAL VASCULAR BALLOON ANGIOPLASTY;  Surgeon: Harvey Carlin BRAVO, MD;  Location: [facility];  Service: Cardiovascular;  Laterality: Right;  arm fistula   PORT-A-CATH REMOVAL Left 11/22/2017   Procedure: REMOVAL PORT-A-CATH LEFT CHEST;  Surgeon: Harvey Carlin BRAVO, MD;  Location: [facility] OR;  Service: Vascular;  Laterality: Left;   RETINAL DETACHMENT SURGERY Right    SCLEROTHERAPY  07/23/2020   Procedure: SCLEROTHERAPY;  Surgeon: Golda Claudis PENNER, MD;  [facility];  Service: Endoscopy;;   TUNNELLED CATHETER EXCHANGE N/A 08/16/2023   Procedure: TUNNELLED CATHETER EXCHANGE;  Surgeon: Magda Debby SAILOR, MD;  Location: [facility];  Service: Cardiovascular;  Laterality:  N/A;   UPPER EXTREMITY VENOGRAPHY N/A 08/22/2020   Procedure: LEFT UPPER & CENTRAL VENOGRAPHY;  Surgeon: Gretta Lonni PARAS, MD;  Location: [facility];  Service: Cardiovascular;  Laterality: N/A;    Allergies  Allergen Reactions   Ace Inhibitors Other (See Comments) and Cough    Tongue swelling Angioedema    Angiotensin Receptor Blockers Other (See Comments)    Angioedema with ACE-I   Penicillins Other (See Comments)    Unknown reaction    Outpatient Encounter Medications as of 09/17/2023  Medication Sig   acetaminophen  (TYLENOL ) 325 MG tablet Take 650 mg by mouth 3 (three) times daily.   acyclovir  (ZOVIRAX ) 200 MG capsule Take 200 mg by mouth in the morning. (0800)   anastrozole  (ARIMIDEX ) 1 MG tablet TAKE 1 TABLET BY MOUTH DAILY   aspirin  EC 81 MG tablet Take 1 tablet (81 mg total) by mouth daily. Swallow whole.   atorvastatin  (LIPITOR) 40 MG tablet Take 1 tablet (40 mg total) by mouth daily.   Calcium  Carb-Cholecalciferol  (CALCIUM  + VITAMIN D3) 500-5 MG-MCG TABS Take 1 tablet by mouth daily.   melatonin 5 MG TABS Take 5 mg by mouth at bedtime.   multivitamin (RENA-VIT) TABS tablet Take 1 tablet by  mouth at bedtime.   Nutritional Supplements (ENSURE ENLIVE PO) Take 237 mLs by mouth every evening.   ondansetron  (ZOFRAN -ODT) 4 MG disintegrating tablet Take 4 mg by mouth daily before breakfast.   pantoprazole  (PROTONIX ) 40 MG tablet Take 40 mg by mouth 2 (two) times daily.   sennosides-docusate sodium (SENOKOT-S) 8.6-50 MG tablet Take 1 tablet by mouth 2 (two) times daily.   sertraline  (ZOLOFT ) 25 MG tablet Take 25 mg by mouth daily. Was taking 50 mg but it stopped on 05-25-2023   sevelamer  carbonate (RENVELA ) 800 MG tablet Take 800 mg by mouth daily.   sucralfate  (CARAFATE ) 1 GM/10ML suspension Take 10 mLs by mouth 2 (two) times daily.   predniSONE  (DELTASONE ) 50 MG tablet Take 1 tablet (50 mg total) by mouth daily with breakfast. (Patient not taking: Reported on 09/17/2023)    Facility-Administered Encounter Medications as of 09/17/2023  Medication   lanreotide acetate  (SOMATULINE DEPOT ) 120 MG/0.5ML injection   lanreotide acetate  (SOMATULINE DEPOT ) 120 MG/0.5ML injection   lanreotide acetate  (SOMATULINE DEPOT ) 120 MG/0.5ML injection   lanreotide acetate  (SOMATULINE DEPOT ) 120 MG/0.5ML injection   octreotide  (SANDOSTATIN  LAR) 30 MG IM injection   octreotide  (SANDOSTATIN  LAR) 30 MG IM injection    Review of Systems  Immunization History  Administered Date(s) Administered   Fluad Quad(high Dose 65+) 11/22/2018, 12/09/2021, 12/15/2022   Hepatitis B, ADULT 02/28/2020   Influenza Inj Mdck Quad Pf 12/20/2014   Influenza,inj,Quad PF,6+ Mos 11/22/2015, 12/11/2020   Influenza,inj,quad, With Preservative 12/20/2014, 12/31/2016   Influenza-Unspecified 11/22/2018, 12/15/2019, 12/09/2021   Moderna Covid-19 Vaccine Bivalent Booster 68yrs & up 12/31/2020   Moderna SARS-COV2 Booster Vaccination 06/19/2020, 10/23/2020, 07/29/2021, 12/31/2021   Moderna Sars-Covid-2 Vaccination 05/11/2019, 06/05/2019, 01/11/2020, 12/15/2022, 01/04/2023, 06/08/2023   PNEUMOCOCCAL CONJUGATE-20 12/13/2020   PPD Test 05/03/2019, 06/23/2019, 08/16/2019, 06/27/2020, 07/13/2020   Pneumococcal Conjugate-13 12/20/2014   Pneumococcal Polysaccharide-23 12/06/2015   Pneumococcal-Unspecified 12/20/2014   Rsv, Bivalent, Protein Subunit Rsvpref,pf Marlow) 02/09/2022   Tdap 01/16/2021   Zoster Recombinant(Shingrix) 11/05/2009, 04/01/2021   Pertinent  Health Maintenance Due  Topic Date Due   HEMOGLOBIN A1C  09/29/2023   INFLUENZA VACCINE  10/08/2023   FOOT EXAM  12/31/2023   OPHTHALMOLOGY EXAM  02/22/2024   DEXA SCAN  Completed      02/03/2022    2:00 PM 02/17/2022    1:39 PM 03/17/2022    3:36 PM 12/31/2022   11:42 AM 03/17/2023   12:00 PM  Fall Risk  Falls in the past year?    0 0  Was there an injury with Fall?    0 0  Fall Risk Category Calculator    0 0  (RETIRED) Patient Fall Risk  Level High fall risk  High fall risk  High fall risk     Patient at Risk for Falls Due to    Impaired balance/gait;Impaired mobility   Fall risk Follow up    Falls evaluation completed      Data saved with a previous flowsheet row definition   Functional Status Survey:    Vitals:   09/17/23 1319  BP: (!) 152/78  Pulse: 79  Resp: 20  Temp: (!) 97.3 F (36.3 C)  SpO2: 95%  Weight: 146 lb 3.2 oz (66.3 kg)  Height: 5' 3 (1.6 m)   Body mass index is 25.9 kg/m. Physical Exam  Labs reviewed: Recent Labs    12/22/22 0405 01/05/23 1157 05/26/23 0524 05/27/23 0444 06/01/23 1348 09/02/23 1204 09/09/23 1307 09/16/23 1328  NA 136   < >  137 137   < > 139 137 138  K 5.0   < > 4.4 4.2   < > 4.1 4.1 4.0  CL 96*   < > 97* 97*   < > 98 96* 94*  CO2 20*   < > 23 25   < > 27 27 28   GLUCOSE 90   < > 104* 69*   < > 90 154* 147*  BUN 81*   < > 61* 33*   < > 20 21 26*  CREATININE 12.02*   < > 11.95* 7.39*   < > 5.65* 5.79* 5.82*  CALCIUM  8.8*   < > 9.2 9.5   < > 9.5 9.0 9.3  MG  --    < >  --   --    < > 2.3 2.3 2.4  PHOS 10.8*  --  5.3* 4.2  --   --   --   --    < > = values in this interval not displayed.   Recent Labs    09/02/23 1204 09/09/23 1307 09/16/23 1328  AST 18 16 18   ALT 10 12 12   ALKPHOS 83 80 83  BILITOT 0.3 0.4 0.4  PROT 7.2 7.0 7.5  ALBUMIN  3.6 3.5 3.8   Recent Labs    09/02/23 1204 09/09/23 1307 09/16/23 1328  WBC 5.0 3.7* 4.3  NEUTROABS 3.2 2.2 2.7  HGB 10.1* 10.4* 10.6*  HCT 31.0* 32.4* 32.9*  MCV 103.7* 104.9* 104.8*  PLT 188 175 193   Lab Results  Component Value Date   TSH 1.265 08/03/2023   Lab Results  Component Value Date   HGBA1C 4.9 04/01/2023   Lab Results  Component Value Date   CHOL 100 08/03/2023   HDL 44 08/03/2023   LDLCALC 40 08/03/2023   TRIG 82 08/03/2023   CHOLHDL 2.3 08/03/2023    Significant Diagnostic Results in last 30 days:  No results found.  Assessment/Plan There are no diagnoses linked to this  encounter.   Family/ staff Communication: ***  Labs/tests ordered:  ***

## 2023-09-20 DIAGNOSIS — Z992 Dependence on renal dialysis: Secondary | ICD-10-CM | POA: Diagnosis not present

## 2023-09-20 DIAGNOSIS — N186 End stage renal disease: Secondary | ICD-10-CM | POA: Diagnosis not present

## 2023-09-21 ENCOUNTER — Other Ambulatory Visit: Payer: Self-pay

## 2023-09-22 ENCOUNTER — Encounter: Payer: Self-pay | Admitting: Adult Health

## 2023-09-22 ENCOUNTER — Non-Acute Institutional Stay (SKILLED_NURSING_FACILITY): Payer: Self-pay | Admitting: Adult Health

## 2023-09-22 DIAGNOSIS — I700 Atherosclerosis of aorta: Secondary | ICD-10-CM | POA: Diagnosis not present

## 2023-09-22 DIAGNOSIS — F01518 Vascular dementia, unspecified severity, with other behavioral disturbance: Secondary | ICD-10-CM

## 2023-09-22 DIAGNOSIS — I70209 Unspecified atherosclerosis of native arteries of extremities, unspecified extremity: Secondary | ICD-10-CM | POA: Diagnosis not present

## 2023-09-22 DIAGNOSIS — I120 Hypertensive chronic kidney disease with stage 5 chronic kidney disease or end stage renal disease: Secondary | ICD-10-CM | POA: Diagnosis not present

## 2023-09-22 DIAGNOSIS — E1122 Type 2 diabetes mellitus with diabetic chronic kidney disease: Secondary | ICD-10-CM | POA: Diagnosis not present

## 2023-09-22 DIAGNOSIS — N186 End stage renal disease: Secondary | ICD-10-CM | POA: Diagnosis not present

## 2023-09-22 DIAGNOSIS — Z992 Dependence on renal dialysis: Secondary | ICD-10-CM

## 2023-09-22 NOTE — Progress Notes (Signed)
 Location:  [facility] Room Number: 146 Place of Service:  SNF (31)   CODE STATUS: dnr   Allergies  Allergen Reactions   Ace Inhibitors Other (See Comments) and Cough    Tongue swelling Angioedema    Angiotensin Receptor Blockers Other (See Comments)    Angioedema with ACE-I   Penicillins Other (See Comments)    Unknown reaction    Chief Complaint  Patient presents with   Medical Management of Chronic Issues          Aortic atherosclerosis/atherosclerotic peripheral vascular disease  Vascular dementia with behavioral disturbance:  Diabetes type 2 with end stage renal disease (ESRD)    HPI:  She is a 88 y.o. long term resident of this facility being seen for the management of her chronic illnesses:Aortic atherosclerosis/atherosclerotic peripheral vascular disease  Vascular dementia with behavioral disturbance:  Diabetes type 2 with end stage renal disease (ESRD). There are no reports of uncontrolled pain. Her weight is stable at this time. There are no reports of anxiety or behavioral issues.    Past Medical History:  Diagnosis Date   Anemia     chronic macrocytic anemia   Anxiety    Chronic kidney disease    Chronic renal disease, stage 4, severely decreased glomerular filtration rate (GFR) between 15-29 mL/min/1.73 square meter (HCC) 08/22/2015   Complication of anesthesia    delirious after Breast Surgery   Dementia (HCC)    mild   Depression    Diabetes mellitus with ESRD (end-stage renal disease) (HCC)    type II   Dysphagia    Dyspnea    with activity   GERD (gastroesophageal reflux disease)    Glaucoma    Hyperlipidemia    Hypertension    Multiple myeloma (HCC)    Pneumonia    Stage 1 infiltrating ductal carcinoma of right female breast (HCC) 08/21/2015   ER+ PR+ HER 2 neu + (3+) T1cN0     Past Surgical History:  Procedure Laterality Date   A/V FISTULAGRAM Right 07/05/2020   Procedure: A/V Fistulagram;  Surgeon: Harvey Carlin BRAVO, MD;  [facility];  Service: Cardiovascular;  Laterality: Right;   AV FISTULA PLACEMENT Left 11/22/2017   Procedure: ARTERIOVENOUS (AV) FISTULA CREATION LEFT ARM;  Surgeon: Harvey Carlin BRAVO, MD;  Location: [facility] OR;  Service: Vascular;  Laterality: Left;   AV FISTULA PLACEMENT Right 04/04/2020   Procedure: RIGHT ARM ARTERIOVENOUS FISTULA CREATION;  Surgeon: Oris Krystal FALCON, MD;  Location: [facility] ORS;  Service: Vascular;  Laterality: Right;   AV FISTULA PLACEMENT Right 08/20/2020   Procedure: ARTERIOVENOUS (AV) FISTULA LIGATION RIGHT ARM;  Surgeon: Harvey Carlin BRAVO, MD;  Location: [facility] OR;  Service: Vascular;  Laterality: Right;   BIOPSY  08/07/2016   Procedure: BIOPSY;  Surgeon: Shaaron [name], MD;  Location: [facility];  Service: Endoscopy;;  gastric ulcer biopsy   COLONOSCOPY     ESOPHAGOGASTRODUODENOSCOPY N/A 08/07/2016   LA Grade A esophagitis s/p dilation, small hiatal hernia, multiple gastric ulcers and erosions, duodenal erosions s/p biopsy. Negative H.pylori    ESOPHAGOGASTRODUODENOSCOPY N/A 11/27/2016   normal esophagus, previously noted gastric ulcers completely healed, normal duodenum.    ESOPHAGOGASTRODUODENOSCOPY (EGD) WITH PROPOFOL  N/A 07/23/2020   Procedure: ESOPHAGOGASTRODUODENOSCOPY (EGD) WITH PROPOFOL ;  Surgeon: Golda Claudis PENNER, MD;  [facility];  Service: Endoscopy;  Laterality: N/A;   FISTULA SUPERFICIALIZATION Left 02/14/2018   Procedure: FISTULA SUPERFICIALIZATION LEFT ARM;  Surgeon: Eliza Lonni RAMAN, MD;  Location: [facility] OR;  Service: Vascular;  Laterality: Left;   FRACTURE SURGERY Right    ankle   HOT HEMOSTASIS  07/23/2020   Procedure: HOT HEMOSTASIS (ARGON PLASMA COAGULATION/BICAP);  Surgeon: Golda Claudis PENNER, MD;  [facility];  Service: Endoscopy;;   INTRAMEDULLARY (IM) NAIL INTERTROCHANTERIC Right 07/12/2020   Procedure: INTRAMEDULLARY (IM) NAIL INTERTROCHANTRIC;  Surgeon: Onesimo Oneil LABOR, MD;  Location: [facility];  Service:  Orthopedics;  Laterality: Right;   IR FLUORO GUIDE CV LINE LEFT  12/21/2022   IR US  GUIDE VASC ACCESS LEFT  12/21/2022   MALONEY DILATION N/A 08/07/2016   Procedure: AGAPITO DILATION;  Surgeon: Shaaron [name], MD;  [facility];  Service: Endoscopy;  Laterality: N/A;   MASTECTOMY, PARTIAL Right    PERIPHERAL VASCULAR BALLOON ANGIOPLASTY Left 07/13/2019   Procedure: PERIPHERAL VASCULAR BALLOON ANGIOPLASTY;  Surgeon: Gretta Lonni PARAS, MD;  [facility];  Service: Cardiovascular;  Laterality: Left;  arm fistulogram   PERIPHERAL VASCULAR BALLOON ANGIOPLASTY Right 05/22/2020   Procedure: PERIPHERAL VASCULAR BALLOON ANGIOPLASTY;  Surgeon: Magda Debby SAILOR, MD;  [facility];  Service: Cardiovascular;  Laterality: Right;  arm fistula   PERIPHERAL VASCULAR BALLOON ANGIOPLASTY Right 07/05/2020   Procedure: PERIPHERAL VASCULAR BALLOON ANGIOPLASTY;  Surgeon: Harvey Carlin BRAVO, MD;  [facility];  Service: Cardiovascular;  Laterality: Right;  arm fistula   PORT-A-CATH REMOVAL Left 11/22/2017   Procedure: REMOVAL PORT-A-CATH LEFT CHEST;  Surgeon: Harvey Carlin BRAVO, MD;  Location: [facility] OR;  Service: Vascular;  Laterality: Left;   RETINAL DETACHMENT SURGERY Right    SCLEROTHERAPY  07/23/2020   Procedure: SCLEROTHERAPY;  Surgeon: Golda Claudis PENNER, MD;  [facility];  Service: Endoscopy;;   TUNNELLED CATHETER EXCHANGE N/A 08/16/2023   Procedure: TUNNELLED CATHETER EXCHANGE;  Surgeon: Magda Debby SAILOR, MD;  Location: [facility];  Service: Cardiovascular;  Laterality: N/A;   UPPER EXTREMITY VENOGRAPHY N/A 08/22/2020   Procedure: LEFT UPPER & CENTRAL VENOGRAPHY;  Surgeon: Gretta Lonni PARAS, MD;  [facility];  Service: Cardiovascular;  Laterality: N/A;    Social History   Socioeconomic History   Marital status: Single    Spouse name: Not on file   Number of children: Not on file   Years of education: Not on file    Highest education level: Not on file  Occupational History   Occupation: retired   Tobacco Use   Smoking status: Never   Smokeless tobacco: Never  Vaping Use   Vaping status: Never Used  Substance and Sexual Activity   Alcohol  use: No    Alcohol /week: 0.0 standard drinks of alcohol    Drug use: No   Sexual activity: Never  Other Topics Concern   Not on file  Social History Narrative   Long term resident of SNF    Social Drivers of Health   Financial Resource Strain: Low Risk  (01/09/2020)   Overall Financial Resource Strain (CARDIA)    Difficulty of Paying Living Expenses: Not very hard  Food Insecurity: Patient Unable To Answer (05/24/2023)   Hunger Vital Sign    Worried About Running Out of Food in the Last Year: Patient unable to answer    Ran Out of Food in the Last Year: Patient unable to answer  Transportation Needs: Patient Unable To Answer (05/24/2023)   PRAPARE - Transportation    Lack of Transportation (Medical): Patient unable to answer    Lack of Transportation (Non-Medical): Patient unable  to answer  Physical Activity: Inactive (01/09/2020)   Exercise Vital Sign    Days of Exercise per Week: 0 days    Minutes of Exercise per Session: 0 min  Stress: No Stress Concern Present (01/09/2020)   [employer] of Occupational Health - Occupational Stress Questionnaire    Feeling of Stress : Not at all  Social Connections: Patient Unable To Answer (05/24/2023)   Social Connection and Isolation Panel    Frequency of Communication with Friends and Family: Patient unable to answer    Frequency of Social Gatherings with Friends and Family: Patient unable to answer    Attends Religious Services: Patient unable to answer    Active Member of Clubs or Organizations: Patient unable to answer    Attends Banker Meetings: Patient unable to answer    Marital Status: Patient unable to answer  Intimate Partner Violence: Patient Unable To Answer (05/24/2023)    Humiliation, Afraid, Rape, and Kick questionnaire    Fear of Current or Ex-Partner: Patient unable to answer    Emotionally Abused: Patient unable to answer    Physically Abused: Patient unable to answer    Sexually Abused: Patient unable to answer   Family History  Problem Relation Age of Onset   Multiple myeloma Sister    Brain cancer Sister    Dementia Mother        died at 44   Stroke Mother    Heart failure Mother    Diabetes Mother    Heart disease Father    Prostate cancer Brother    Colon cancer Neg Hx       VITAL SIGNS BP (!) 140/61   Pulse 68   Temp 98.1 F (36.7 C)   Resp 18   Ht 5' 3 (1.6 m)   Wt 146 lb 3.2 oz (66.3 kg)   SpO2 96%   BMI 25.90 kg/m   Outpatient Encounter Medications as of 09/22/2023  Medication Sig   acetaminophen  (TYLENOL ) 325 MG tablet Take 650 mg by mouth 3 (three) times daily.   acyclovir  (ZOVIRAX ) 200 MG capsule Take 200 mg by mouth in the morning. (0800)   anastrozole  (ARIMIDEX ) 1 MG tablet TAKE 1 TABLET BY MOUTH DAILY   aspirin  EC 81 MG tablet Take 1 tablet (81 mg total) by mouth daily. Swallow whole.   atorvastatin  (LIPITOR) 40 MG tablet Take 1 tablet (40 mg total) by mouth daily.   Calcium  Carb-Cholecalciferol  (CALCIUM  + VITAMIN D3) 500-5 MG-MCG TABS Take 1 tablet by mouth daily.   melatonin 5 MG TABS Take 5 mg by mouth at bedtime.   multivitamin (RENA-VIT) TABS tablet Take 1 tablet by mouth at bedtime.   Nutritional Supplements (ENSURE ENLIVE PO) Take 237 mLs by mouth every evening.   ondansetron  (ZOFRAN -ODT) 4 MG disintegrating tablet Take 4 mg by mouth daily before breakfast.   pantoprazole  (PROTONIX ) 40 MG tablet Take 40 mg by mouth 2 (two) times daily.   predniSONE  (DELTASONE ) 50 MG tablet Take 1 tablet (50 mg total) by mouth daily with breakfast. (Patient not taking: Reported on 09/17/2023)   sennosides-docusate sodium (SENOKOT-S) 8.6-50 MG tablet Take 1 tablet by mouth 2 (two) times daily.   sertraline  (ZOLOFT ) 25 MG tablet  Take 25 mg by mouth daily. Was taking 50 mg but it stopped on 05-25-2023   sevelamer  carbonate (RENVELA ) 800 MG tablet Take 800 mg by mouth daily.   sucralfate  (CARAFATE ) 1 GM/10ML suspension Take 10 mLs by mouth 2 (two) times daily.  Facility-Administered Encounter Medications as of 09/22/2023  Medication   lanreotide acetate  (SOMATULINE DEPOT ) 120 MG/0.5ML injection   lanreotide acetate  (SOMATULINE DEPOT ) 120 MG/0.5ML injection   lanreotide acetate  (SOMATULINE DEPOT ) 120 MG/0.5ML injection   lanreotide acetate  (SOMATULINE DEPOT ) 120 MG/0.5ML injection   octreotide  (SANDOSTATIN  LAR) 30 MG IM injection   octreotide  (SANDOSTATIN  LAR) 30 MG IM injection     SIGNIFICANT DIAGNOSTIC EXAMS  LABS REVIEWED PREVIOUS    09-15-22: wbc 5.2; hgb 9.9; hct 31.0; mcv 103.3 plt 224; glucose 132; bun 22; creat 6.19; k+ 4.2; na++ 135; ca 9.5 gfr 6 protein 6.9 albumin  3.7     11-10-22: wbc 4.0; hgb 10.7; hct 33.4; mcv 102.1 plt 187; glucose 110; bun 28; creat 6.16; k+ 4.8; na++ 130; ca 9.0; gfr 6; protein 6.8 albumin  3.6 mag 2.4  12-16-22: wbc 6.7; hgb 12.4; hct 38.3; mcv 101.3 plt 201; glucose 123; bun 22; creat 4.81; k+ 4.1; na++ 133; ca 9.4; gfr 8; protein 7.9; albumin  4.1; blood culture: staphylococcus hominis  12-22-22: wbc 4.2; hgb 9.8; hct 30.1; mcv 100.0; plt 184; glucose 90; bun 81; creat 12.02; k+ 5.0; na++ 136; ca 8.8; gfr 3; phos 10.8; albumin  3.2 02-16-23: wbc 4.9; hgb 9.2; hct 28.8; mcv 104.7 plt 215 glucose 194; bun 32; creat 8.17; k+ 5.1; na++ 9.8;gfr 4; protein 7.5 albumin  3.9 mag 2.6   05-27-23: uric acid 3.4 07-01-23: wbc 4.1; hgb 11.2; hct 35.2; mcv 101.4 plt 197;glucose 145; bun 22; creat 6.07; k+ 3.8; na++ 135; ca 9.4 gfr 6 protein 7.2 albumin  3.6 mag 2.4   TODAY  08-26-23: wbc 4.0; hgb 10.5; hct 32.8; mcv 103.5 plt 197; glucose 116; bun 24; creat 5.78; k+ 4.2; na++ 135; ca 9.5 gfr 7; protein 7.2 albumin  3.6 mag 2.3   Review of Systems  Constitutional:  Negative for malaise/fatigue.   Respiratory:  Negative for cough and shortness of breath.   Cardiovascular:  Negative for chest pain, palpitations and leg swelling.  Gastrointestinal:  Negative for abdominal pain, constipation and heartburn.  Musculoskeletal:  Negative for back pain, joint pain and myalgias.  Skin: Negative.   Neurological:  Negative for dizziness.  Psychiatric/Behavioral:  The patient is not nervous/anxious.    Physical Exam Constitutional:      General: She is not in acute distress.    Appearance: She is well-developed. She is not diaphoretic.  Neck:     Thyroid : No thyromegaly.  Cardiovascular:     Rate and Rhythm: Normal rate and regular rhythm.     Pulses: Normal pulses.     Heart sounds: Normal heart sounds.  Pulmonary:     Effort: Pulmonary effort is normal. No respiratory distress.     Breath sounds: Normal breath sounds.  Abdominal:     General: Bowel sounds are normal. There is no distension.     Palpations: Abdomen is soft.     Tenderness: There is no abdominal tenderness.  Musculoskeletal:        General: Normal range of motion.     Cervical back: Neck supple.     Right lower leg: No edema.     Left lower leg: No edema.  Lymphadenopathy:     Cervical: No cervical adenopathy.  Skin:    General: Skin is warm and dry.     Comments: Tunneled dialysis access   chest wall       Neurological:     Mental Status: She is alert. Mental status is at baseline.  Psychiatric:  Mood and Affect: Mood normal.     ASSESSMENT/PLAN  TODAY  Aortic atherosclerosis/atherosclerotic peripheral vascular disease (ct 10-04-18) ldl 89  2. Vascular dementia with behavioral disturbance: weight is 146 pounds  3. Diabetes type 2 with end stage renal disease (ESRD) hgb A1c 5.1 is off medications.   PREVIOUS    4. Anemia due to end stage renal disease; hgb 10.5  5. Post menopausal osteoporosis: t score -2.957 will continue supplements   6. Stage 1 infiltrating ductal carcinoma of female  right breast / s/p right mastectomy will continue arimidex  1 mg daily   7. Multiple myeloma not having achieved remission: is followed by oncology  8. Herpes: without outbreak will continue acyclovir  200 mg daily   9. Major depression chronic recurrent: will continue zoloft  25 mg daily   10. Protein calorie malnutrition: protein 7.2; albumin  3.6; will continue supplements as directed  11. Gastroesophageal  reflux disease without esophagitis: will continue protonix  40 mg twice daily carafate  1 gm two times daily; zofran  4 mg prior to breakfast  12. Generalized chronic pain: will continue tylenol  650 mg three times daily   13. Chronic kidney disease with end stage renal disease on dialysis due to type 2 diabetes mellitus/dependence on dialysis: will continue dialysis three times weekly; renvela  800 mg daily with 1200 fluid restriction  14. Hypertension associated with end stage renal disease (ESRD) due to type 2 diabetes mellitus: b/p 140/71 will monitor   15. Hyperlipidemia associated with type 2 diabetes mellitus ldl 89 id off lipitor had elevated liver enzymes.   Will check hgb A1c   Barnie Seip NP [facility]  call [phone]

## 2023-09-23 ENCOUNTER — Inpatient Hospital Stay

## 2023-09-23 ENCOUNTER — Encounter: Payer: Self-pay | Admitting: Hematology

## 2023-09-23 VITALS — BP 120/80 | HR 85 | Temp 96.7°F | Resp 18

## 2023-09-23 DIAGNOSIS — C9000 Multiple myeloma not having achieved remission: Secondary | ICD-10-CM

## 2023-09-23 DIAGNOSIS — Z5112 Encounter for antineoplastic immunotherapy: Secondary | ICD-10-CM | POA: Diagnosis not present

## 2023-09-23 LAB — CBC WITH DIFFERENTIAL/PLATELET
Abs Immature Granulocytes: 0.01 K/uL (ref 0.00–0.07)
Basophils Absolute: 0 K/uL (ref 0.0–0.1)
Basophils Relative: 1 %
Eosinophils Absolute: 0.2 K/uL (ref 0.0–0.5)
Eosinophils Relative: 5 %
HCT: 31.1 % — ABNORMAL LOW (ref 36.0–46.0)
Hemoglobin: 10 g/dL — ABNORMAL LOW (ref 12.0–15.0)
Immature Granulocytes: 0 %
Lymphocytes Relative: 18 %
Lymphs Abs: 0.8 K/uL (ref 0.7–4.0)
MCH: 33.7 pg (ref 26.0–34.0)
MCHC: 32.2 g/dL (ref 30.0–36.0)
MCV: 104.7 fL — ABNORMAL HIGH (ref 80.0–100.0)
Monocytes Absolute: 0.4 K/uL (ref 0.1–1.0)
Monocytes Relative: 10 %
Neutro Abs: 2.8 K/uL (ref 1.7–7.7)
Neutrophils Relative %: 66 %
Platelets: 185 K/uL (ref 150–400)
RBC: 2.97 MIL/uL — ABNORMAL LOW (ref 3.87–5.11)
RDW: 15.3 % (ref 11.5–15.5)
WBC: 4.3 K/uL (ref 4.0–10.5)
nRBC: 0 % (ref 0.0–0.2)

## 2023-09-23 LAB — COMPREHENSIVE METABOLIC PANEL WITH GFR
ALT: 12 U/L (ref 0–44)
AST: 18 U/L (ref 15–41)
Albumin: 3.5 g/dL (ref 3.5–5.0)
Alkaline Phosphatase: 82 U/L (ref 38–126)
Anion gap: 15 (ref 5–15)
BUN: 23 mg/dL (ref 8–23)
CO2: 27 mmol/L (ref 22–32)
Calcium: 9.5 mg/dL (ref 8.9–10.3)
Chloride: 95 mmol/L — ABNORMAL LOW (ref 98–111)
Creatinine, Ser: 5.45 mg/dL — ABNORMAL HIGH (ref 0.44–1.00)
GFR, Estimated: 7 mL/min — ABNORMAL LOW (ref >60)
Glucose, Bld: 152 mg/dL — ABNORMAL HIGH (ref 70–99)
Potassium: 3.9 mmol/L (ref 3.5–5.1)
Sodium: 137 mmol/L (ref 135–145)
Total Bilirubin: 0.6 mg/dL (ref 0.0–1.2)
Total Protein: 7.3 g/dL (ref 6.5–8.1)

## 2023-09-23 LAB — MAGNESIUM: Magnesium: 2.3 mg/dL (ref 1.7–2.4)

## 2023-09-23 MED ORDER — PROCHLORPERAZINE MALEATE 10 MG PO TABS
10.0000 mg | ORAL_TABLET | Freq: Once | ORAL | Status: AC
  Administered 2023-09-23: 10 mg via ORAL
  Filled 2023-09-23: qty 1

## 2023-09-23 MED ORDER — BORTEZOMIB CHEMO SQ INJECTION 3.5 MG (2.5MG/ML)
1.2000 mg/m2 | Freq: Once | INTRAMUSCULAR | Status: AC
  Administered 2023-09-23: 2 mg via SUBCUTANEOUS
  Filled 2023-09-23: qty 0.8

## 2023-09-23 NOTE — Patient Instructions (Signed)
 [facility] [place] [facility]  Discharge Instructions: Thank you for choosing [facility] to provide your oncology and hematology care.  If you have a lab appointment with the Cancer Center - please note that after April 8th, 2024, all labs will be drawn in the cancer center.  You do not have to check in or register with the main entrance as you have in the past but will complete your check-in in the cancer center.  Wear comfortable clothing and clothing appropriate for easy access to any Portacath or PICC line.   We strive to give you quality time with your provider. You may need to reschedule your appointment if you arrive late (15 or more minutes).  Arriving late affects you and other patients whose appointments are after yours.  Also, if you miss three or more appointments without notifying the office, you may be dismissed from the clinic at the provider's discretion.      For prescription refill requests, have your pharmacy contact our office and allow 72 hours for refills to be completed.    Today you received the following chemotherapy and/or immunotherapy agents velcade     To help prevent nausea and vomiting after your treatment, we encourage you to take your nausea medication as directed.  BELOW ARE SYMPTOMS THAT SHOULD BE REPORTED IMMEDIATELY: *FEVER GREATER THAN 100.4 F (38 C) OR HIGHER *CHILLS OR SWEATING *NAUSEA AND VOMITING THAT IS NOT CONTROLLED WITH YOUR NAUSEA MEDICATION *UNUSUAL SHORTNESS OF BREATH *UNUSUAL BRUISING OR BLEEDING *URINARY PROBLEMS (pain or burning when urinating, or frequent urination) *BOWEL PROBLEMS (unusual diarrhea, constipation, pain near the anus) TENDERNESS IN MOUTH AND THROAT WITH OR WITHOUT PRESENCE OF ULCERS (sore throat, sores in mouth, or a toothache) UNUSUAL RASH, SWELLING OR PAIN  UNUSUAL VAGINAL DISCHARGE OR ITCHING   Items with * indicate a potential emergency and should be followed up as  soon as possible or go to the Emergency Department if any problems should occur.  Please show the CHEMOTHERAPY ALERT CARD or IMMUNOTHERAPY ALERT CARD at check-in to the Emergency Department and triage nurse.  Should you have questions after your visit or need to cancel or reschedule your appointment, please contact [facility] CANCER CTR [place] - A DEPT OF Eligha Bridegroom [facility] [phone]  and follow the prompts.  Office hours are 8:00 a.m. to 4:30 p.m. Monday - Friday. Please note that voicemails left after 4:00 p.m. may not be returned until the following business day.  We are closed weekends and major holidays. You have access to a nurse at all times for urgent questions. Please call the main number to the clinic [phone] and follow the prompts.  For any non-urgent questions, you may also contact your provider using MyChart. We now offer e-Visits for anyone 62 and older to request care online for non-urgent symptoms. For details visit [email or web address].   Also download the MyChart app! Go to the app store, search "MyChart", open the app, select [place], and log in with your MyChart username and password.

## 2023-09-23 NOTE — Progress Notes (Signed)
 No new issues with patient today. No change in labs today. Proceed as planned  Treatment given per orders. Patient tolerated it well without problems. Vitals stable and discharged back to [facility]. Follow up as scheduled.

## 2023-09-24 DIAGNOSIS — N186 End stage renal disease: Secondary | ICD-10-CM | POA: Diagnosis not present

## 2023-09-24 DIAGNOSIS — Z992 Dependence on renal dialysis: Secondary | ICD-10-CM | POA: Diagnosis not present

## 2023-09-24 LAB — KAPPA/LAMBDA LIGHT CHAINS
Kappa free light chain: 200 mg/L — ABNORMAL HIGH (ref 3.3–19.4)
Kappa, lambda light chain ratio: 0.32 (ref 0.26–1.65)
Lambda free light chains: 616.2 mg/L — ABNORMAL HIGH (ref 5.7–26.3)

## 2023-09-27 ENCOUNTER — Other Ambulatory Visit (HOSPITAL_COMMUNITY)
Admission: RE | Admit: 2023-09-27 | Discharge: 2023-09-27 | Disposition: A | Discharge status: Home / Self Care | Payer: Self-pay | Source: Skilled Nursing Facility | Attending: Adult Health | Admitting: Adult Health

## 2023-09-27 DIAGNOSIS — E1169 Type 2 diabetes mellitus with other specified complication: Secondary | ICD-10-CM | POA: Insufficient documentation

## 2023-09-27 DIAGNOSIS — Z992 Dependence on renal dialysis: Secondary | ICD-10-CM | POA: Diagnosis not present

## 2023-09-27 DIAGNOSIS — N186 End stage renal disease: Secondary | ICD-10-CM | POA: Diagnosis not present

## 2023-09-27 LAB — PROTEIN ELECTROPHORESIS, SERUM
A/G Ratio: 1.5 (ref 0.7–1.7)
Albumin ELP: 4.1 g/dL (ref 2.9–4.4)
Alpha-1-Globulin: 0.2 g/dL (ref 0.0–0.4)
Alpha-2-Globulin: 0.7 g/dL (ref 0.4–1.0)
Beta Globulin: 1 g/dL (ref 0.7–1.3)
Gamma Globulin: 0.7 g/dL (ref 0.4–1.8)
Globulin, Total: 2.7 g/dL (ref 2.2–3.9)
M-Spike, %: 0.4 g/dL — ABNORMAL HIGH
Total Protein ELP: 6.8 g/dL (ref 6.0–8.5)

## 2023-09-27 LAB — IMMUNOFIXATION ELECTROPHORESIS
IgA: 457 mg/dL — ABNORMAL HIGH (ref 64–422)
IgG (Immunoglobin G), Serum: 848 mg/dL (ref 586–1602)
IgM (Immunoglobulin M), Srm: 12 mg/dL — ABNORMAL LOW (ref 26–217)
Total Protein ELP: 6.6 g/dL (ref 6.0–8.5)

## 2023-09-27 LAB — HEMOGLOBIN A1C
Hgb A1c MFr Bld: 5.5 % (ref 4.8–5.6)
Mean Plasma Glucose: 111.15 mg/dL

## 2023-09-28 ENCOUNTER — Other Ambulatory Visit: Payer: Self-pay

## 2023-09-29 DIAGNOSIS — N186 End stage renal disease: Secondary | ICD-10-CM | POA: Diagnosis not present

## 2023-09-29 DIAGNOSIS — Z992 Dependence on renal dialysis: Secondary | ICD-10-CM | POA: Diagnosis not present

## 2023-09-30 ENCOUNTER — Inpatient Hospital Stay

## 2023-09-30 VITALS — BP 118/63 | HR 94 | Temp 97.9°F | Resp 18

## 2023-09-30 DIAGNOSIS — C9000 Multiple myeloma not having achieved remission: Secondary | ICD-10-CM

## 2023-09-30 DIAGNOSIS — Z5112 Encounter for antineoplastic immunotherapy: Secondary | ICD-10-CM | POA: Diagnosis not present

## 2023-09-30 LAB — COMPREHENSIVE METABOLIC PANEL WITH GFR
ALT: 15 U/L (ref 0–44)
AST: 21 U/L (ref 15–41)
Albumin: 3.8 g/dL (ref 3.5–5.0)
Alkaline Phosphatase: 84 U/L (ref 38–126)
Anion gap: 12 (ref 5–15)
BUN: 19 mg/dL (ref 8–23)
CO2: 27 mmol/L (ref 22–32)
Calcium: 9.6 mg/dL (ref 8.9–10.3)
Chloride: 97 mmol/L — ABNORMAL LOW (ref 98–111)
Creatinine, Ser: 4.95 mg/dL — ABNORMAL HIGH (ref 0.44–1.00)
GFR, Estimated: 8 mL/min — ABNORMAL LOW (ref >60)
Glucose, Bld: 150 mg/dL — ABNORMAL HIGH (ref 70–99)
Potassium: 4.4 mmol/L (ref 3.5–5.1)
Sodium: 136 mmol/L (ref 135–145)
Total Bilirubin: 0.5 mg/dL (ref 0.0–1.2)
Total Protein: 7.4 g/dL (ref 6.5–8.1)

## 2023-09-30 LAB — CBC WITH DIFFERENTIAL/PLATELET
Abs Immature Granulocytes: 0.01 K/uL (ref 0.00–0.07)
Basophils Absolute: 0.1 K/uL (ref 0.0–0.1)
Basophils Relative: 1 %
Eosinophils Absolute: 0.2 K/uL (ref 0.0–0.5)
Eosinophils Relative: 4 %
HCT: 32.9 % — ABNORMAL LOW (ref 36.0–46.0)
Hemoglobin: 10.5 g/dL — ABNORMAL LOW (ref 12.0–15.0)
Immature Granulocytes: 0 %
Lymphocytes Relative: 16 %
Lymphs Abs: 0.8 K/uL (ref 0.7–4.0)
MCH: 33.5 pg (ref 26.0–34.0)
MCHC: 31.9 g/dL (ref 30.0–36.0)
MCV: 105.1 fL — ABNORMAL HIGH (ref 80.0–100.0)
Monocytes Absolute: 0.8 K/uL (ref 0.1–1.0)
Monocytes Relative: 14 %
Neutro Abs: 3.5 K/uL (ref 1.7–7.7)
Neutrophils Relative %: 65 %
Platelets: 167 K/uL (ref 150–400)
RBC: 3.13 MIL/uL — ABNORMAL LOW (ref 3.87–5.11)
RDW: 15.6 % — ABNORMAL HIGH (ref 11.5–15.5)
WBC: 5.4 K/uL (ref 4.0–10.5)
nRBC: 0 % (ref 0.0–0.2)

## 2023-09-30 LAB — MAGNESIUM: Magnesium: 2.4 mg/dL (ref 1.7–2.4)

## 2023-09-30 MED ORDER — BORTEZOMIB CHEMO SQ INJECTION 3.5 MG (2.5MG/ML)
1.2000 mg/m2 | Freq: Once | INTRAMUSCULAR | Status: AC
  Administered 2023-09-30: 2 mg via SUBCUTANEOUS
  Filled 2023-09-30: qty 0.8

## 2023-09-30 MED ORDER — PROCHLORPERAZINE MALEATE 10 MG PO TABS
10.0000 mg | ORAL_TABLET | Freq: Once | ORAL | Status: AC
  Administered 2023-09-30: 10 mg via ORAL
  Filled 2023-09-30: qty 1

## 2023-09-30 NOTE — Progress Notes (Signed)
 Patient's labs within treatment parameters, unable to get weight due to patient's inability to stand on her own. Patient tolerated Velcade  injection with no complaints voiced. Lab work reviewed. See MAR for details. Injection site clean and dry with no bruising or swelling noted. Patient stable during and after injection. Band aid applied. VSS. Patient left in satisfactory condition with no s/s of distress noted.

## 2023-09-30 NOTE — Patient Instructions (Signed)
 [facility] [place] - A DEPT OF [facility]  Discharge Instructions: Thank you for choosing [facility] to provide your oncology and hematology care.  If you have a lab appointment with the Cancer Center - please note that after April 8th, 2024, all labs will be drawn in the cancer center.  You do not have to check in or register with the main entrance as you have in the past but will complete your check-in in the cancer center.  Wear comfortable clothing and clothing appropriate for easy access to any Portacath or PICC line.   We strive to give you quality time with your provider. You may need to reschedule your appointment if you arrive late (15 or more minutes).  Arriving late affects you and other patients whose appointments are after yours.  Also, if you miss three or more appointments without notifying the office, you may be dismissed from the clinic at the provider's discretion.      For prescription refill requests, have your pharmacy contact our office and allow 72 hours for refills to be completed.    Today you received the following chemotherapy and/or immunotherapy agents Velcade, return as scheduled.   To help prevent nausea and vomiting after your treatment, we encourage you to take your nausea medication as directed.  BELOW ARE SYMPTOMS THAT SHOULD BE REPORTED IMMEDIATELY: *FEVER GREATER THAN 100.4 F (38 C) OR HIGHER *CHILLS OR SWEATING *NAUSEA AND VOMITING THAT IS NOT CONTROLLED WITH YOUR NAUSEA MEDICATION *UNUSUAL SHORTNESS OF BREATH *UNUSUAL BRUISING OR BLEEDING *URINARY PROBLEMS (pain or burning when urinating, or frequent urination) *BOWEL PROBLEMS (unusual diarrhea, constipation, pain near the anus) TENDERNESS IN MOUTH AND THROAT WITH OR WITHOUT PRESENCE OF ULCERS (sore throat, sores in mouth, or a toothache) UNUSUAL RASH, SWELLING OR PAIN  UNUSUAL VAGINAL DISCHARGE OR ITCHING   Items with * indicate a potential emergency and  should be followed up as soon as possible or go to the Emergency Department if any problems should occur.  Please show the CHEMOTHERAPY ALERT CARD or IMMUNOTHERAPY ALERT CARD at check-in to the Emergency Department and triage nurse.  Should you have questions after your visit or need to cancel or reschedule your appointment, please contact [facility] [place] - A DEPT OF [facility] [phone]  and follow the prompts.  Office hours are 8:00 a.m. to 4:30 p.m. Monday - Friday. Please note that voicemails left after 4:00 p.m. may not be returned until the following business day.  We are closed weekends and major holidays. You have access to a nurse at all times for urgent questions. Please call the main number to the clinic [phone] and follow the prompts.  For any non-urgent questions, you may also contact your provider using MyChart. We now offer e-Visits for anyone 68 and older to request care online for non-urgent symptoms. For details visit [email or web address].   Also download the MyChart app! Go to the app store, search "MyChart", open the app, select [place], and log in with your MyChart username and password.

## 2023-10-01 DIAGNOSIS — Z992 Dependence on renal dialysis: Secondary | ICD-10-CM | POA: Diagnosis not present

## 2023-10-01 DIAGNOSIS — N186 End stage renal disease: Secondary | ICD-10-CM | POA: Diagnosis not present

## 2023-10-03 DIAGNOSIS — N186 End stage renal disease: Secondary | ICD-10-CM | POA: Diagnosis not present

## 2023-10-03 DIAGNOSIS — Z992 Dependence on renal dialysis: Secondary | ICD-10-CM | POA: Diagnosis not present

## 2023-10-04 DIAGNOSIS — Z992 Dependence on renal dialysis: Secondary | ICD-10-CM | POA: Diagnosis not present

## 2023-10-04 DIAGNOSIS — N186 End stage renal disease: Secondary | ICD-10-CM | POA: Diagnosis not present

## 2023-10-06 ENCOUNTER — Other Ambulatory Visit: Payer: Self-pay

## 2023-10-06 DIAGNOSIS — Z992 Dependence on renal dialysis: Secondary | ICD-10-CM | POA: Diagnosis not present

## 2023-10-06 DIAGNOSIS — N186 End stage renal disease: Secondary | ICD-10-CM | POA: Diagnosis not present

## 2023-10-07 ENCOUNTER — Inpatient Hospital Stay (HOSPITAL_BASED_OUTPATIENT_CLINIC_OR_DEPARTMENT_OTHER): Admitting: Hematology

## 2023-10-07 ENCOUNTER — Other Ambulatory Visit: Payer: Self-pay

## 2023-10-07 ENCOUNTER — Inpatient Hospital Stay

## 2023-10-07 VITALS — BP 135/72 | HR 93 | Temp 97.8°F | Resp 18

## 2023-10-07 DIAGNOSIS — C9000 Multiple myeloma not having achieved remission: Secondary | ICD-10-CM

## 2023-10-07 DIAGNOSIS — Z5112 Encounter for antineoplastic immunotherapy: Secondary | ICD-10-CM | POA: Diagnosis not present

## 2023-10-07 LAB — CBC WITH DIFFERENTIAL/PLATELET
Abs Immature Granulocytes: 0.01 K/uL (ref 0.00–0.07)
Basophils Absolute: 0 K/uL (ref 0.0–0.1)
Basophils Relative: 1 %
Eosinophils Absolute: 0.2 K/uL (ref 0.0–0.5)
Eosinophils Relative: 5 %
HCT: 30.6 % — ABNORMAL LOW (ref 36.0–46.0)
Hemoglobin: 9.9 g/dL — ABNORMAL LOW (ref 12.0–15.0)
Immature Granulocytes: 0 %
Lymphocytes Relative: 17 %
Lymphs Abs: 0.7 K/uL (ref 0.7–4.0)
MCH: 33.3 pg (ref 26.0–34.0)
MCHC: 32.4 g/dL (ref 30.0–36.0)
MCV: 103 fL — ABNORMAL HIGH (ref 80.0–100.0)
Monocytes Absolute: 0.5 K/uL (ref 0.1–1.0)
Monocytes Relative: 11 %
Neutro Abs: 2.8 K/uL (ref 1.7–7.7)
Neutrophils Relative %: 66 %
Platelets: 172 K/uL (ref 150–400)
RBC: 2.97 MIL/uL — ABNORMAL LOW (ref 3.87–5.11)
RDW: 15.6 % — ABNORMAL HIGH (ref 11.5–15.5)
WBC: 4.3 K/uL (ref 4.0–10.5)
nRBC: 0 % (ref 0.0–0.2)

## 2023-10-07 LAB — COMPREHENSIVE METABOLIC PANEL WITH GFR
ALT: 13 U/L (ref 0–44)
AST: 19 U/L (ref 15–41)
Albumin: 3.6 g/dL (ref 3.5–5.0)
Alkaline Phosphatase: 87 U/L (ref 38–126)
Anion gap: 12 (ref 5–15)
BUN: 22 mg/dL (ref 8–23)
CO2: 26 mmol/L (ref 22–32)
Calcium: 9.5 mg/dL (ref 8.9–10.3)
Chloride: 96 mmol/L — ABNORMAL LOW (ref 98–111)
Creatinine, Ser: 5.18 mg/dL — ABNORMAL HIGH (ref 0.44–1.00)
GFR, Estimated: 8 mL/min — ABNORMAL LOW (ref >60)
Glucose, Bld: 126 mg/dL — ABNORMAL HIGH (ref 70–99)
Potassium: 4.1 mmol/L (ref 3.5–5.1)
Sodium: 134 mmol/L — ABNORMAL LOW (ref 135–145)
Total Bilirubin: 0.5 mg/dL (ref 0.0–1.2)
Total Protein: 7.1 g/dL (ref 6.5–8.1)

## 2023-10-07 LAB — MAGNESIUM: Magnesium: 2.5 mg/dL — ABNORMAL HIGH (ref 1.7–2.4)

## 2023-10-07 MED ORDER — BORTEZOMIB CHEMO SQ INJECTION 3.5 MG (2.5MG/ML)
1.2000 mg/m2 | Freq: Once | INTRAMUSCULAR | Status: AC
  Administered 2023-10-07: 2 mg via SUBCUTANEOUS
  Filled 2023-10-07: qty 0.8

## 2023-10-07 MED ORDER — PROCHLORPERAZINE MALEATE 10 MG PO TABS
10.0000 mg | ORAL_TABLET | Freq: Once | ORAL | Status: AC
  Administered 2023-10-07: 10 mg via ORAL
  Filled 2023-10-07: qty 1

## 2023-10-07 NOTE — Progress Notes (Signed)
 Labs reviewed with MD today at office visit. Will proceed as planned  Treatment given per orders. Patient tolerated it well without problems. Vitals stable and discharged back to the [facility] via personal wheelchair. Follow up as scheduled.

## 2023-10-07 NOTE — Patient Instructions (Signed)
 [facility] [place] - A DEPT OF [facility]  Discharge Instructions: Thank you for choosing [facility] to provide your oncology and hematology care.  If you have a lab appointment with the Cancer Center - please note that after April 8th, 2024, all labs will be drawn in the cancer center.  You do not have to check in or register with the main entrance as you have in the past but will complete your check-in in the cancer center.  Wear comfortable clothing and clothing appropriate for easy access to any Portacath or PICC line.   We strive to give you quality time with your provider. You may need to reschedule your appointment if you arrive late (15 or more minutes).  Arriving late affects you and other patients whose appointments are after yours.  Also, if you miss three or more appointments without notifying the office, you may be dismissed from the clinic at the provider's discretion.      For prescription refill requests, have your pharmacy contact our office and allow 72 hours for refills to be completed.    Today you received the following chemotherapy and/or immunotherapy agents bortezomib    To help prevent nausea and vomiting after your treatment, we encourage you to take your nausea medication as directed.  BELOW ARE SYMPTOMS THAT SHOULD BE REPORTED IMMEDIATELY: *FEVER GREATER THAN 100.4 F (38 C) OR HIGHER *CHILLS OR SWEATING *NAUSEA AND VOMITING THAT IS NOT CONTROLLED WITH YOUR NAUSEA MEDICATION *UNUSUAL SHORTNESS OF BREATH *UNUSUAL BRUISING OR BLEEDING *URINARY PROBLEMS (pain or burning when urinating, or frequent urination) *BOWEL PROBLEMS (unusual diarrhea, constipation, pain near the anus) TENDERNESS IN MOUTH AND THROAT WITH OR WITHOUT PRESENCE OF ULCERS (sore throat, sores in mouth, or a toothache) UNUSUAL RASH, SWELLING OR PAIN  UNUSUAL VAGINAL DISCHARGE OR ITCHING   Items with * indicate a potential emergency and should be followed up  as soon as possible or go to the Emergency Department if any problems should occur.  Please show the CHEMOTHERAPY ALERT CARD or IMMUNOTHERAPY ALERT CARD at check-in to the Emergency Department and triage nurse.  Should you have questions after your visit or need to cancel or reschedule your appointment, please contact [facility] [place] - A DEPT OF [name] [phone]  and follow the prompts.  Office hours are 8:00 a.m. to 4:30 p.m. Monday - Friday. Please note that voicemails left after 4:00 p.m. may not be returned until the following business day.  We are closed weekends and major holidays. You have access to a nurse at all times for urgent questions. Please call the main number to the clinic [phone] and follow the prompts.  For any non-urgent questions, you may also contact your provider using MyChart. We now offer e-Visits for anyone 69 and older to request care online for non-urgent symptoms. For details visit [email or web address].   Also download the MyChart app! Go to the app store, search MyChart, open the app, select [place], and log in with your MyChart username and password.

## 2023-10-07 NOTE — Progress Notes (Signed)
 [facility] [address] [place] [address]    Clinic Day:  10/07/23   Referring physician: [name], NP  Patient Care Team: [name], NP as PCP - General (Geriatric Medicine) [name], MD as PCP - Cardiology (Cardiology) Rourk, [name], MD as Consulting Physician (Gastroenterology) [name], MD as Medical Oncologist (Medical Oncology) [name], MD (Inactive) as Consulting Physician (Nephrology) [facility]   ASSESSMENT & PLAN:   Assessment: 1.  IgA lambda plasma cell myeloma, stage II, standard risk: -BMBX on 09/20/2018 shows plasma cell myeloma, 30% plasma cells.  FISH panel was normal.  Chromosome analysis normal. -Labs at diagnosis on 08/29/2018 with M spike 0.9 g.  Kappa light chains 148, lambda light chains 1132, ratio 0.13.  LDH normal.  Beta-2  microglobulin 13.5.  She had transfusion dependent anemia. -Velcade  and dexamethasone  started on 10/11/2018.  Revlimid not started because of worsening/unstable renal function. -She has been resident at [facility] since 10/12/2019.   2.  Stage I IDC of the right breast, ER/PR positive, HER-2 negative: -She is on anastrozole .   Plan: 1.  IgA lambda plasma cell myeloma: - She is tolerating weekly Velcade  reasonably well.  No recent infections or hospitalizations. - Myeloma labs (09/23/2023): M spike is stable at 0.4 g.  FLC ratio is normal at 0.32.  Lambda light chains are 616, staying stable between 500-600 since January. - Labs today: CBC grossly unchanged.  LFTs are normal. - Recommend continuing weekly Velcade .  We are continuing palliative treatment of myeloma due to her other comorbidities.  RTC 12 weeks with repeat myeloma labs.   2.  ESRD on HD: - Continue HD on Monday/Wednesday/Friday.   3.  Peripheral neuropathy: - She has tingling/numbness in the left hand fingertips which is stable.  No neuropathic pains.  No neuropathy in  the other extremities.   4.  Right breast cancer: - Continue anastrozole  daily.  No orders of the defined types were placed in this encounter.     Carly Wood,acting as a Neurosurgeon for Hai Rogers, MD.,have documented all relevant documentation on the behalf of Hai Rogers, MD,as directed by  Hai Rogers, MD while in the presence of Hai Rogers, MD.  I, Hai Rogers MD, have reviewed the above documentation for accuracy and completeness, and I agree with the above.        Hai Rogers, MD   7/31/20254:45 PM  CHIEF COMPLAINT:   Diagnosis: plasma cell myeloma and right breast cancer    Cancer Staging  Stage 1 infiltrating ductal carcinoma of right female breast [facility]) Staging form: Breast, AJCC 7th Edition - Clinical stage from 08/30/2015: Stage IA (T1c, N0, M0) - Signed by [name], PA-C on 08/30/2015    Prior Therapy: none  Current Therapy:  maintenance Velcade  & Decadron  3/4 weeks; anastrozole    HISTORY OF PRESENT ILLNESS:   Oncology History  Stage 1 infiltrating ductal carcinoma of right female breast (HCC)  09/12/2014 Mammogram   Mass in upper outer R breast, middle third depth appears slightly larger than it was on prior exam with more irreg spiculated margins   10/02/2014 Pathology Results   biopsy with invasive ductal carcinoma high grade 1.1 cm, dcis solid type. additional R breast tissue, excision, invasive ductal high grade 0.9 cm    10/24/2014 Pathology Results   no residual invasive carcinoma, DCIS, focal, atypical ductal hyperplasia, 0/7 LN positive for metastatic carcinoma ER > 90%, PR  30%, HER 2 2+   10/24/2014 Cancer Staging   T1cN0M0   10/24/2014 Surgery   R mastectomy, T1c, N0   12/28/2014 - 11/22/2015 Chemotherapy   Taxol/herceptin  weekly X 12, Herceptin  every 21 days, last due in September 2017   03/11/2015 -  Anti-estrogen oral therapy   Arimidex  1 mg daily   09/12/2015 Imaging   MUGA- The  left ventricular ejection fraction equals 68%.   06/08/2016 Treatment Plan Change   Started Nerlynx    Multiple myeloma (HCC)  10/04/2018 Initial Diagnosis   Multiple myeloma (HCC)   10/11/2018 - 10/28/2021 Chemotherapy   Patient is on Treatment Plan : MYELOMA NON-TRANSPLANT CANDIDATES VRd weekly d21d      11/25/2021 -  Chemotherapy   Patient is on Treatment Plan : MYELOMA MAINTENANCE Bortezomib  SQ q7d        INTERVAL HISTORY:   Carly Wood is a 88 y.o. female presenting to clinic today for follow up of plasma cell myeloma and breast cancer. She was last seen by me on 08/12/2023.  Today, she states that she is doing well overall. Her appetite level is at 100%. Her energy level is at 50%. Carly Wood reports stable neuropathy presenting as numbness in the left hand. She denies any neuropathy in the lower extremities or right hand.   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   Dysphagia    Dyspnea    with activity   GERD (gastroesophageal reflux disease)    Glaucoma    Hyperlipidemia    Hypertension    Multiple myeloma (HCC)    Pneumonia    Stage 1 infiltrating ductal carcinoma of right female breast (HCC) 08/21/2015   ER+ PR+ HER 2 neu + (3+) T1cN0     Surgical History: Past Surgical History:  Procedure Laterality Date   A/V FISTULAGRAM Right 07/05/2020   Procedure: A/V Fistulagram;  Surgeon: Harvey Carlin BRAVO, MD;  [facility];  Service: Cardiovascular;  Laterality: Right;   AV FISTULA PLACEMENT Left 11/22/2017   Procedure:  ARTERIOVENOUS (AV) FISTULA CREATION LEFT ARM;  Surgeon: Harvey Carlin BRAVO, MD;  Location: [facility] OR;  Service: Vascular;  Laterality: Left;   AV FISTULA PLACEMENT Right 04/04/2020   Procedure: RIGHT ARM ARTERIOVENOUS FISTULA CREATION;  Surgeon: Oris Krystal FALCON, MD;  Location: [facility] ORS;  Service: Vascular;  Laterality: Right;   AV FISTULA PLACEMENT Right 08/20/2020   Procedure: ARTERIOVENOUS (AV) FISTULA LIGATION RIGHT ARM;  Surgeon: Harvey Carlin BRAVO, MD;  Location: [facility] OR;  Service: Vascular;  Laterality: Right;   BIOPSY  08/07/2016   Procedure: BIOPSY;  Surgeon: Shaaron [name], MD;  Location: [facility];  Service: Endoscopy;;  gastric ulcer biopsy   COLONOSCOPY     ESOPHAGOGASTRODUODENOSCOPY N/A 08/07/2016   LA Grade A esophagitis s/p dilation, small hiatal hernia, multiple gastric ulcers and erosions, duodenal erosions s/p biopsy. Negative H.pylori    ESOPHAGOGASTRODUODENOSCOPY N/A 11/27/2016   normal esophagus, previously noted gastric ulcers completely healed, normal duodenum.    ESOPHAGOGASTRODUODENOSCOPY (EGD)  WITH PROPOFOL  N/A 07/23/2020   Procedure: ESOPHAGOGASTRODUODENOSCOPY (EGD) WITH PROPOFOL ;  Surgeon: Golda Claudis PENNER, MD;  [facility];  Service: Endoscopy;  Laterality: N/A;   FISTULA SUPERFICIALIZATION Left 02/14/2018   Procedure: FISTULA SUPERFICIALIZATION LEFT ARM;  Surgeon: Eliza Lonni RAMAN, MD;  Location: [facility] OR;  Service: Vascular;  Laterality: Left;   FRACTURE SURGERY Right    ankle   HOT HEMOSTASIS  07/23/2020   Procedure: HOT HEMOSTASIS (ARGON PLASMA COAGULATION/BICAP);  Surgeon: Golda Claudis PENNER, MD;  [facility];  Service: Endoscopy;;   INTRAMEDULLARY (IM) NAIL INTERTROCHANTERIC Right 07/12/2020   Procedure: INTRAMEDULLARY (IM) NAIL INTERTROCHANTRIC;  Surgeon: Onesimo Oneil LABOR, MD;  Location: [facility];  Service: Orthopedics;  Laterality: Right;   IR FLUORO GUIDE CV LINE LEFT  12/21/2022   IR US  GUIDE VASC ACCESS LEFT  12/21/2022   MALONEY DILATION  N/A 08/07/2016   Procedure: AGAPITO DILATION;  Surgeon: Shaaron [name], MD;  [facility];  Service: Endoscopy;  Laterality: N/A;   MASTECTOMY, PARTIAL Right    PERIPHERAL VASCULAR BALLOON ANGIOPLASTY Left 07/13/2019   Procedure: PERIPHERAL VASCULAR BALLOON ANGIOPLASTY;  Surgeon: Gretta Lonni PARAS, MD;  [facility];  Service: Cardiovascular;  Laterality: Left;  arm fistulogram   PERIPHERAL VASCULAR BALLOON ANGIOPLASTY Right 05/22/2020   Procedure: PERIPHERAL VASCULAR BALLOON ANGIOPLASTY;  Surgeon: Magda Debby SAILOR, MD;  [facility];  Service: Cardiovascular;  Laterality: Right;  arm fistula   PERIPHERAL VASCULAR BALLOON ANGIOPLASTY Right 07/05/2020   Procedure: PERIPHERAL VASCULAR BALLOON ANGIOPLASTY;  Surgeon: Harvey Carlin BRAVO, MD;  [facility];  Service: Cardiovascular;  Laterality: Right;  arm fistula   PORT-A-CATH REMOVAL Left 11/22/2017   Procedure: REMOVAL PORT-A-CATH LEFT CHEST;  Surgeon: Harvey Carlin BRAVO, MD;  Location: [facility] OR;  Service: Vascular;  Laterality: Left;   RETINAL DETACHMENT SURGERY Right    SCLEROTHERAPY  07/23/2020   Procedure: SCLEROTHERAPY;  Surgeon: Golda Claudis PENNER, MD;  [facility];  Service: Endoscopy;;   TUNNELLED CATHETER EXCHANGE N/A 08/16/2023   Procedure: TUNNELLED CATHETER EXCHANGE;  Surgeon: Magda Debby SAILOR, MD;  Location: [facility];  Service: Cardiovascular;  Laterality: N/A;   UPPER EXTREMITY VENOGRAPHY N/A 08/22/2020   Procedure: LEFT UPPER & CENTRAL VENOGRAPHY;  Surgeon: Gretta Lonni PARAS, MD;  [facility];  Service: Cardiovascular;  Laterality: N/A;    Social History: Social History   Socioeconomic History   Marital status: Single    Spouse name: Not on file   Number of children: Not on file   Years of education: Not on file   Highest education level: Not on file  Occupational History   Occupation: retired   Tobacco Use   Smoking status: Never    Smokeless tobacco: Never  Vaping Use   Vaping status: Never Used  Substance and Sexual Activity   Alcohol  use: No    Alcohol /week: 0.0 standard drinks of alcohol    Drug use: No   Sexual activity: Never  Other Topics Concern   Not on file  Social History Narrative   Long term resident of SNF    Social Drivers of Health   Financial Resource Strain: Low Risk  (01/09/2020)   Overall Financial Resource Strain (CARDIA)    Difficulty of Paying Living Expenses: Not very hard  Food Insecurity: Patient Unable To Answer (05/24/2023)   Hunger Vital Sign    Worried About Running Out of Food in the Last Year: Patient unable  to answer    Ran Out of Food in the Last Year: Patient unable to answer  Transportation Needs: Patient Unable To Answer (05/24/2023)   PRAPARE - Transportation    Lack of Transportation (Medical): Patient unable to answer    Lack of Transportation (Non-Medical): Patient unable to answer  Physical Activity: Inactive (01/09/2020)   Exercise Vital Sign    Days of Exercise per Week: 0 days    Minutes of Exercise per Session: 0 min  Stress: No Stress Concern Present (01/09/2020)   [employer] of Occupational Health - Occupational Stress Questionnaire    Feeling of Stress : Not at all  Social Connections: Patient Unable To Answer (05/24/2023)   Social Connection and Isolation Panel    Frequency of Communication with Friends and Family: Patient unable to answer    Frequency of Social Gatherings with Friends and Family: Patient unable to answer    Attends Religious Services: Patient unable to answer    Active Member of Clubs or Organizations: Patient unable to answer    Attends Banker Meetings: Patient unable to answer    Marital Status: Patient unable to answer  Intimate Partner Violence: Patient Unable To Answer (05/24/2023)   Humiliation, Afraid, Rape, and Kick questionnaire    Fear of Current or Ex-Partner: Patient unable to answer    Emotionally Abused:  Patient unable to answer    Physically Abused: Patient unable to answer    Sexually Abused: Patient unable to answer    Family History: Family History  Problem Relation Age of Onset   Multiple myeloma Sister    Brain cancer Sister    Dementia Mother        died at 53   Stroke Mother    Heart failure Mother    Diabetes Mother    Heart disease Father    Prostate cancer Brother    Colon cancer Neg Hx     Current Medications:  Current Outpatient Medications:    acetaminophen  (TYLENOL ) 325 MG tablet, Take 650 mg by mouth 3 (three) times daily., Disp: , Rfl:    acyclovir  (ZOVIRAX ) 200 MG capsule, Take 200 mg by mouth in the morning. (0800), Disp: , Rfl:    anastrozole  (ARIMIDEX ) 1 MG tablet, TAKE 1 TABLET BY MOUTH DAILY, Disp: 30 tablet, Rfl: 6   aspirin  EC 81 MG tablet, Take 1 tablet (81 mg total) by mouth daily. Swallow whole., Disp: , Rfl:    atorvastatin  (LIPITOR) 40 MG tablet, Take 1 tablet (40 mg total) by mouth daily., Disp: , Rfl:    Calcium  Carb-Cholecalciferol  (CALCIUM  + VITAMIN D3) 500-5 MG-MCG TABS, Take 1 tablet by mouth daily., Disp: , Rfl:    melatonin 5 MG TABS, Take 5 mg by mouth at bedtime., Disp: , Rfl:    multivitamin (RENA-VIT) TABS tablet, Take 1 tablet by mouth at bedtime., Disp: , Rfl:    Nutritional Supplements (ENSURE ENLIVE PO), Take 237 mLs by mouth every evening., Disp: , Rfl:    ondansetron  (ZOFRAN -ODT) 4 MG disintegrating tablet, Take 4 mg by mouth daily before breakfast., Disp: , Rfl:    pantoprazole  (PROTONIX ) 40 MG tablet, Take 40 mg by mouth 2 (two) times daily., Disp: , Rfl:    sennosides-docusate sodium (SENOKOT-S) 8.6-50 MG tablet, Take 1 tablet by mouth 2 (two) times daily., Disp: , Rfl:    sertraline  (ZOLOFT ) 25 MG tablet, Take 25 mg by mouth daily. Was taking 50 mg but it stopped on 05-25-2023, Disp: , Rfl:  sevelamer  carbonate (RENVELA ) 800 MG tablet, Take 800 mg by mouth daily., Disp: , Rfl:    sucralfate  (CARAFATE ) 1 GM/10ML suspension, Take  10 mLs by mouth 2 (two) times daily., Disp: , Rfl:  No current facility-administered medications for this visit.  Facility-Administered Medications Ordered in Other Visits:    lanreotide acetate  (SOMATULINE DEPOT ) 120 MG/0.5ML injection, , , ,    lanreotide acetate  (SOMATULINE DEPOT ) 120 MG/0.5ML injection, , , ,    lanreotide acetate  (SOMATULINE DEPOT ) 120 MG/0.5ML injection, , , ,    lanreotide acetate  (SOMATULINE DEPOT ) 120 MG/0.5ML injection, , , ,    octreotide  (SANDOSTATIN  LAR) 30 MG IM injection, , , ,    octreotide  (SANDOSTATIN  LAR) 30 MG IM injection, , , ,    Allergies: Allergies  Allergen Reactions   Ace Inhibitors Other (See Comments) and Cough    Tongue swelling Angioedema    Angiotensin Receptor Blockers Other (See Comments)    Angioedema with ACE-I   Penicillins Other (See Comments)    Unknown reaction    REVIEW OF SYSTEMS:   Review of Systems  Constitutional:  Negative for chills, fatigue and fever.  HENT:   Negative for lump/mass, mouth sores, nosebleeds, sore throat and trouble swallowing.   Eyes:  Negative for eye problems.  Respiratory:  Negative for cough and shortness of breath.   Cardiovascular:  Negative for chest pain, leg swelling and palpitations.  Gastrointestinal:  Negative for abdominal pain, constipation, diarrhea, nausea and vomiting.  Genitourinary:  Negative for bladder incontinence, difficulty urinating, dysuria, frequency, hematuria and nocturia.   Musculoskeletal:  Negative for arthralgias, back pain, flank pain, myalgias and neck pain.  Skin:  Negative for itching and rash.  Neurological:  Positive for numbness (in fingers). Negative for dizziness and headaches.  Hematological:  Does not bruise/bleed easily.  Psychiatric/Behavioral:  Negative for depression, sleep disturbance and suicidal ideas. The patient is not nervous/anxious.   All other systems reviewed and are negative.    VITALS:   Blood pressure 135/72, pulse 93, temperature  97.8 F (36.6 C), temperature source Tympanic, resp. rate 18, SpO2 100%.  Wt Readings from Last 3 Encounters:  09/22/23 146 lb 3.2 oz (66.3 kg)  09/17/23 146 lb 3.2 oz (66.3 kg)  09/16/23 148 lb (67.1 kg)    There is no height or weight on file to calculate BMI.  Performance status (ECOG): 1 - Symptomatic but completely ambulatory  PHYSICAL EXAM:   Physical Exam Vitals and nursing note reviewed. Exam conducted with a chaperone present.  Constitutional:      Appearance: Normal appearance.  Cardiovascular:     Rate and Rhythm: Normal rate and regular rhythm.     Pulses: Normal pulses.     Heart sounds: Normal heart sounds.  Pulmonary:     Effort: Pulmonary effort is normal.     Breath sounds: Normal breath sounds.  Abdominal:     Palpations: Abdomen is soft. There is no hepatomegaly, splenomegaly or mass.     Tenderness: There is no abdominal tenderness.  Musculoskeletal:     Right lower leg: No edema.     Left lower leg: No edema.  Lymphadenopathy:     Cervical: No cervical adenopathy.     Right cervical: No superficial, deep or posterior cervical adenopathy.    Left cervical: No superficial, deep or posterior cervical adenopathy.     Upper Body:     Right upper body: No supraclavicular or axillary adenopathy.     Left upper body:  No supraclavicular or axillary adenopathy.  Neurological:     General: No focal deficit present.     Mental Status: She is alert and oriented to person, place, and time.  Psychiatric:        Mood and Affect: Mood normal.        Behavior: Behavior normal.     LABS:      Latest Ref Rng & Units 10/07/2023   12:54 PM 09/30/2023    9:48 AM 09/23/2023   11:11 AM  CBC  WBC 4.0 - 10.5 K/uL 4.3  5.4  4.3   Hemoglobin 12.0 - 15.0 g/dL 9.9  89.4  89.9   Hematocrit 36.0 - 46.0 % 30.6  32.9  31.1   Platelets 150 - 400 K/uL 172  167  185       Latest Ref Rng & Units 10/07/2023   12:54 PM 09/30/2023    9:48 AM 09/23/2023   11:11 AM  CMP  Glucose  70 - 99 mg/dL 873  849  847   BUN 8 - 23 mg/dL 22  19  23    Creatinine 0.44 - 1.00 mg/dL 4.81  5.04  4.54   Sodium 135 - 145 mmol/L 134  136  137   Potassium 3.5 - 5.1 mmol/L 4.1  4.4  3.9   Chloride 98 - 111 mmol/L 96  97  95   CO2 22 - 32 mmol/L 26  27  27    Calcium  8.9 - 10.3 mg/dL 9.5  9.6  9.5   Total Protein 6.5 - 8.1 g/dL 7.1  7.4  7.3   Total Bilirubin 0.0 - 1.2 mg/dL 0.5  0.5  0.6   Alkaline Phos 38 - 126 U/L 87  84  82   AST 15 - 41 U/L 19  21  18    ALT 0 - 44 U/L 13  15  12       No results found for: CEA1, CEA / No results found for: CEA1, CEA No results found for: PSA1 No results found for: CAN199 No results found for: RJW874  Lab Results  Component Value Date   TOTALPROTELP 6.6 09/23/2023   TOTALPROTELP 6.8 09/23/2023   ALBUMINELP 4.1 09/23/2023   A1GS 0.2 09/23/2023   A2GS 0.7 09/23/2023   BETS 1.0 09/23/2023   GAMS 0.7 09/23/2023   MSPIKE 0.4 (H) 09/23/2023   SPEI Comment 09/23/2023   Lab Results  Component Value Date   TIBC 110 (L) 12/18/2022   TIBC 140 (L) 12/16/2021   TIBC 142 (L) 08/12/2021   FERRITIN 277 12/18/2022   FERRITIN 272 12/16/2021   FERRITIN 312 (H) 08/12/2021   IRONPCTSAT 36 (H) 12/18/2022   IRONPCTSAT 39 (H) 12/16/2021   IRONPCTSAT 65 (H) 08/12/2021   Lab Results  Component Value Date   LDH 120 10/21/2021   LDH 129 10/14/2021   LDH 136 10/07/2020     STUDIES:   No results found.

## 2023-10-07 NOTE — Patient Instructions (Signed)

## 2023-10-08 ENCOUNTER — Other Ambulatory Visit: Payer: Self-pay

## 2023-10-08 ENCOUNTER — Encounter: Payer: Self-pay | Admitting: Hematology

## 2023-10-08 DIAGNOSIS — Z992 Dependence on renal dialysis: Secondary | ICD-10-CM | POA: Diagnosis not present

## 2023-10-08 DIAGNOSIS — N186 End stage renal disease: Secondary | ICD-10-CM | POA: Diagnosis not present

## 2023-10-11 DIAGNOSIS — N186 End stage renal disease: Secondary | ICD-10-CM | POA: Diagnosis not present

## 2023-10-11 DIAGNOSIS — Z992 Dependence on renal dialysis: Secondary | ICD-10-CM | POA: Diagnosis not present

## 2023-10-13 DIAGNOSIS — N186 End stage renal disease: Secondary | ICD-10-CM | POA: Diagnosis not present

## 2023-10-13 DIAGNOSIS — Z992 Dependence on renal dialysis: Secondary | ICD-10-CM | POA: Diagnosis not present

## 2023-10-14 ENCOUNTER — Inpatient Hospital Stay: Attending: Oncology

## 2023-10-14 ENCOUNTER — Inpatient Hospital Stay

## 2023-10-14 VITALS — BP 115/63 | HR 81 | Temp 97.3°F | Resp 18 | Wt 148.0 lb

## 2023-10-14 DIAGNOSIS — C9000 Multiple myeloma not having achieved remission: Secondary | ICD-10-CM

## 2023-10-14 DIAGNOSIS — Z5112 Encounter for antineoplastic immunotherapy: Secondary | ICD-10-CM | POA: Diagnosis not present

## 2023-10-14 LAB — CBC WITH DIFFERENTIAL/PLATELET
Abs Immature Granulocytes: 0.01 K/uL (ref 0.00–0.07)
Basophils Absolute: 0 K/uL (ref 0.0–0.1)
Basophils Relative: 1 %
Eosinophils Absolute: 0.2 K/uL (ref 0.0–0.5)
Eosinophils Relative: 4 %
HCT: 32.8 % — ABNORMAL LOW (ref 36.0–46.0)
Hemoglobin: 10.5 g/dL — ABNORMAL LOW (ref 12.0–15.0)
Immature Granulocytes: 0 %
Lymphocytes Relative: 16 %
Lymphs Abs: 0.8 K/uL (ref 0.7–4.0)
MCH: 33.5 pg (ref 26.0–34.0)
MCHC: 32 g/dL (ref 30.0–36.0)
MCV: 104.8 fL — ABNORMAL HIGH (ref 80.0–100.0)
Monocytes Absolute: 0.5 K/uL (ref 0.1–1.0)
Monocytes Relative: 11 %
Neutro Abs: 3.2 K/uL (ref 1.7–7.7)
Neutrophils Relative %: 68 %
Platelets: 172 K/uL (ref 150–400)
RBC: 3.13 MIL/uL — ABNORMAL LOW (ref 3.87–5.11)
RDW: 15.5 % (ref 11.5–15.5)
WBC: 4.7 K/uL (ref 4.0–10.5)
nRBC: 0 % (ref 0.0–0.2)

## 2023-10-14 LAB — COMPREHENSIVE METABOLIC PANEL WITH GFR
ALT: 12 U/L (ref 0–44)
AST: 23 U/L (ref 15–41)
Albumin: 3.9 g/dL (ref 3.5–5.0)
Alkaline Phosphatase: 88 U/L (ref 38–126)
Anion gap: 15 (ref 5–15)
BUN: 20 mg/dL (ref 8–23)
CO2: 28 mmol/L (ref 22–32)
Calcium: 9.7 mg/dL (ref 8.9–10.3)
Chloride: 94 mmol/L — ABNORMAL LOW (ref 98–111)
Creatinine, Ser: 5.17 mg/dL — ABNORMAL HIGH (ref 0.44–1.00)
GFR, Estimated: 8 mL/min — ABNORMAL LOW (ref >60)
Glucose, Bld: 181 mg/dL — ABNORMAL HIGH (ref 70–99)
Potassium: 4.3 mmol/L (ref 3.5–5.1)
Sodium: 137 mmol/L (ref 135–145)
Total Bilirubin: 0.6 mg/dL (ref 0.0–1.2)
Total Protein: 7.6 g/dL (ref 6.5–8.1)

## 2023-10-14 LAB — MAGNESIUM: Magnesium: 2.3 mg/dL (ref 1.7–2.4)

## 2023-10-14 MED ORDER — BORTEZOMIB CHEMO SQ INJECTION 3.5 MG (2.5MG/ML)
1.2000 mg/m2 | Freq: Once | INTRAMUSCULAR | Status: AC
  Administered 2023-10-14: 2 mg via SUBCUTANEOUS
  Filled 2023-10-14: qty 0.8

## 2023-10-14 MED ORDER — PROCHLORPERAZINE MALEATE 10 MG PO TABS
10.0000 mg | ORAL_TABLET | Freq: Once | ORAL | Status: AC
  Administered 2023-10-14: 10 mg via ORAL
  Filled 2023-10-14: qty 1

## 2023-10-14 NOTE — Progress Notes (Signed)
 Patient presents today for Velcade infusion. Patient is in satisfactory condition with no new complaints voiced.  Vital signs are stable.  Labs reviewed and all labs are within treatment parameters.  We will proceed with treatment per MD orders.    Treatment given today per MD orders. Tolerated infusion without adverse affects. Vital signs stable. No complaints at this time. Discharged from clinic via wheelchair in stable condition. Alert and oriented x 3. F/U with [facility] as scheduled.

## 2023-10-14 NOTE — Patient Instructions (Signed)
 [facility] [place] - A DEPT OF [facility]  Discharge Instructions: Thank you for choosing [facility] to provide your oncology and hematology care.  If you have a lab appointment with the Cancer Center - please note that after April 8th, 2024, all labs will be drawn in the cancer center.  You do not have to check in or register with the main entrance as you have in the past but will complete your check-in in the cancer center.  Wear comfortable clothing and clothing appropriate for easy access to any Portacath or PICC line.   We strive to give you quality time with your provider. You may need to reschedule your appointment if you arrive late (15 or more minutes).  Arriving late affects you and other patients whose appointments are after yours.  Also, if you miss three or more appointments without notifying the office, you may be dismissed from the clinic at the provider's discretion.      For prescription refill requests, have your pharmacy contact our office and allow 72 hours for refills to be completed.    Today you received the following chemotherapy and/or immunotherapy agents Velcade   To help prevent nausea and vomiting after your treatment, we encourage you to take your nausea medication as directed.  Bortezomib Injection What is this medication? BORTEZOMIB (bor TEZ oh mib) treats lymphoma. It may also be used to treat multiple myeloma, a type of bone marrow cancer. It works by blocking a protein that causes cancer cells to grow and multiply. This helps to slow or stop the spread of cancer cells. This medicine may be used for other purposes; ask your health care provider or pharmacist if you have questions. COMMON BRAND NAME(S): BORUZU, Velcade What should I tell my care team before I take this medication? They need to know if you have any of these conditions: Dehydration Diabetes Heart disease Liver disease Tingling of the fingers or toes or  other nerve disorder An unusual or allergic reaction to bortezomib, other medications, foods, dyes, or preservatives If you or your partner are pregnant or trying to get pregnant Breastfeeding How should I use this medication? This medication is injected into a vein or under the skin. It is given by your care team in a hospital or clinic setting. Talk to your care team about the use of this medication in children. Special care may be needed. Overdosage: If you think you have taken too much of this medicine contact a poison control center or emergency room at once. NOTE: This medicine is only for you. Do not share this medicine with others. What if I miss a dose? Keep appointments for follow-up doses. It is important not to miss your dose. Call your care team if you are unable to keep an appointment. What may interact with this medication? Ketoconazole Rifampin This list may not describe all possible interactions. Give your health care provider a list of all the medicines, herbs, non-prescription drugs, or dietary supplements you use. Also tell them if you smoke, drink alcohol, or use illegal drugs. Some items may interact with your medicine. What should I watch for while using this medication? Your condition will be monitored carefully while you are receiving this medication. You may need blood work while taking this medication. This medication may affect your coordination, reaction time, or judgment. Do not drive or operate machinery until you know how this medication affects you. Sit up or stand slowly to  reduce the risk of dizzy or fainting spells. Drinking alcohol with this medication can increase the risk of these side effects. This medication may increase your risk of getting an infection. Call your care team for advice if you get a fever, chills, sore throat, or other symptoms of a cold or flu. Do not treat yourself. Try to avoid being around people who are sick. Check with your care team  if you have severe diarrhea, nausea, and vomiting, or if you sweat a lot. The loss of too much body fluid may make it dangerous for you to take this medication. Talk to your care team if you may be pregnant. Serious birth defects can occur if you take this medication during pregnancy and for 7 months after the last dose. You will need a negative pregnancy test before starting this medication. Contraception is recommended while taking this medication and for 7 months after the last dose. Your care team can help you find the option that works for you. If your partner can get pregnant, use a condom during sex while taking this medication and for 4 months after the last dose. Do not breastfeed while taking this medication and for 2 months after the last dose. This medication may cause infertility. Talk to your care team if you are concerned about your fertility. What side effects may I notice from receiving this medication? Side effects that you should report to your care team as soon as possible: Allergic reactions--skin rash, itching, hives, swelling of the face, lips, tongue, or throat Bleeding--bloody or black, tar-like stools, vomiting blood or brown material that looks like coffee grounds, red or dark brown urine, small red or purple spots on skin, unusual bruising or bleeding Bleeding in the brain--severe headache, stiff neck, confusion, dizziness, change in vision, numbness or weakness of the face, arm, or leg, trouble speaking, trouble walking, vomiting Bowel blockage--stomach cramping, unable to have a bowel movement or pass gas, loss of appetite, vomiting Heart failure--shortness of breath, swelling of the ankles, feet, or hands, sudden weight gain, unusual weakness or fatigue Infection--fever, chills, cough, sore throat, wounds that don't heal, pain or trouble when passing urine, general feeling of discomfort or being unwell Liver injury--right upper belly pain, loss of appetite, nausea,  light-colored stool, dark yellow or brown urine, yellowing skin or eyes, unusual weakness or fatigue Low blood pressure--dizziness, feeling faint or lightheaded, blurry vision Lung injury--shortness of breath or trouble breathing, cough, spitting up blood, chest pain, fever Pain, tingling, or numbness in the hands or feet Severe or prolonged diarrhea Stomach pain, bloody diarrhea, pale skin, unusual weakness or fatigue, decrease in the amount of urine, which may be signs of hemolytic uremic syndrome Sudden and severe headache, confusion, change in vision, seizures, which may be signs of posterior reversible encephalopathy syndrome (PRES) TTP--purple spots on the skin or inside the mouth, pale skin, yellowing skin or eyes, unusual weakness or fatigue, fever, fast or irregular heartbeat, confusion, change in vision, trouble speaking, trouble walking Tumor lysis syndrome (TLS)--nausea, vomiting, diarrhea, decrease in the amount of urine, dark urine, unusual weakness or fatigue, confusion, muscle pain or cramps, fast or irregular heartbeat, joint pain Side effects that usually do not require medical attention (report to your care team if they continue or are bothersome): Constipation Diarrhea Fatigue Loss of appetite Nausea This list may not describe all possible side effects. Call your doctor for medical advice about side effects. You may report side effects to FDA at 1-800-FDA-1088. Where should I keep  my medication? This medication is given in a hospital or clinic. It will not be stored at home. NOTE: This sheet is a summary. It may not cover all possible information. If you have questions about this medicine, talk to your doctor, pharmacist, or health care provider.  2024 Elsevier/Gold Standard (2021-07-29 00:00:00)  BELOW ARE SYMPTOMS THAT SHOULD BE REPORTED IMMEDIATELY: *FEVER GREATER THAN 100.4 F (38 C) OR HIGHER *CHILLS OR SWEATING *NAUSEA AND VOMITING THAT IS NOT CONTROLLED WITH YOUR  NAUSEA MEDICATION *UNUSUAL SHORTNESS OF BREATH *UNUSUAL BRUISING OR BLEEDING *URINARY PROBLEMS (pain or burning when urinating, or frequent urination) *BOWEL PROBLEMS (unusual diarrhea, constipation, pain near the anus) TENDERNESS IN MOUTH AND THROAT WITH OR WITHOUT PRESENCE OF ULCERS (sore throat, sores in mouth, or a toothache) UNUSUAL RASH, SWELLING OR PAIN  UNUSUAL VAGINAL DISCHARGE OR ITCHING   Items with * indicate a potential emergency and should be followed up as soon as possible or go to the Emergency Department if any problems should occur.  Please show the CHEMOTHERAPY ALERT CARD or IMMUNOTHERAPY ALERT CARD at check-in to the Emergency Department and triage nurse.  Should you have questions after your visit or need to cancel or reschedule your appointment, please contact [facility] [place] - A DEPT OF [facility] [phone]  and follow the prompts.  Office hours are 8:00 a.m. to 4:30 p.m. Monday - Friday. Please note that voicemails left after 4:00 p.m. may not be returned until the following business day.  We are closed weekends and major holidays. You have access to a nurse at all times for urgent questions. Please call the main number to the clinic [phone] and follow the prompts.  For any non-urgent questions, you may also contact your provider using MyChart. We now offer e-Visits for anyone 40 and older to request care online for non-urgent symptoms. For details visit [email or web address].   Also download the MyChart app! Go to the app store, search "MyChart", open the app, select [place], and log in with your MyChart username and password.

## 2023-10-15 DIAGNOSIS — Z992 Dependence on renal dialysis: Secondary | ICD-10-CM | POA: Diagnosis not present

## 2023-10-15 DIAGNOSIS — N186 End stage renal disease: Secondary | ICD-10-CM | POA: Diagnosis not present

## 2023-10-16 ENCOUNTER — Other Ambulatory Visit: Payer: Self-pay

## 2023-10-18 DIAGNOSIS — Z992 Dependence on renal dialysis: Secondary | ICD-10-CM | POA: Diagnosis not present

## 2023-10-18 DIAGNOSIS — N186 End stage renal disease: Secondary | ICD-10-CM | POA: Diagnosis not present

## 2023-10-19 ENCOUNTER — Encounter: Payer: Self-pay | Admitting: Internal Medicine

## 2023-10-19 ENCOUNTER — Non-Acute Institutional Stay (SKILLED_NURSING_FACILITY): Payer: Self-pay | Admitting: Internal Medicine

## 2023-10-19 DIAGNOSIS — N186 End stage renal disease: Secondary | ICD-10-CM

## 2023-10-19 DIAGNOSIS — F339 Major depressive disorder, recurrent, unspecified: Secondary | ICD-10-CM | POA: Diagnosis not present

## 2023-10-19 DIAGNOSIS — D631 Anemia in chronic kidney disease: Secondary | ICD-10-CM | POA: Diagnosis not present

## 2023-10-19 DIAGNOSIS — E1351 Other specified diabetes mellitus with diabetic peripheral angiopathy without gangrene: Secondary | ICD-10-CM | POA: Insufficient documentation

## 2023-10-19 DIAGNOSIS — Z992 Dependence on renal dialysis: Secondary | ICD-10-CM

## 2023-10-19 NOTE — Progress Notes (Addendum)
 NURSING HOME LOCATION:  [facility] ROOM NUMBER:  [id number]  CODE STATUS: DNR  PCP: [name], NP   This is a nursing facility follow up visit  of chronic medical diagnoses to document compliance with Regulation 483.30 (c) in The Long Term Care Survey Manual Phase 2 which mandates caregiver visit ( visits can alternate among physician, PA or NP as per statutes) within 10 days of 30 days / 60 days/ 90 days post admission to SNF date  .  Interim medical record and care since last SNF visit was updated with review of diagnostic studies and change in clinical status since last visit were documented.  HPI: She is a permanent resident of this facility with medical diagnoses of multiple myeloma; active breast cancer; GERD; glaucoma; dyslipidemia; essential hypertension; ESRD on hemodialysis; and diabetes with neurovascular complications.  Labs are current; ESRD is stable based on serial creatinines and GFRs.  Over the last 4 months creatinine has ranged from the high of 9.68 to a low of 4.95.  Current value is 5.17.  During that period the GFR has ranged from a nadir value of 4 up to a peak of 8.  The ESRD is associated with a macrocytic anemia with current H/H of 10.5/32.8 and MCV of 104.8.  Serially the anemia is stable.  B12 level was therapeutic at 679 on 08/03/2023.  On that same date TSH was therapeutic at 1.265.  Current A1c is 5.5%; over the last 11 months it has ranged from a low of 4.9 up to the current value.  Recorded glucoses range from a low of 69 up to a high of 228; both are outliers.  Most of the glucoses are in the 140+ range.  She is on no diabetic medications at this time.  Oncology note of 10/07/2023 by Dr. Rogers was reviewed.  Serial myeloma labs were stable.  Weekly Velcade  was to be continued.  Arimidex  was continued for the right breast cancer.  Review of systems: Dementia invalidated responses.  When asked how she were doing her response was all  right.  She then began to complain that her third right finger bends when she puts her hand flat.  There is no associated pain.  She also mention that her back itches.  Finally she stated that she is having trouble sleeping intermittently.  She is on melatonin.  She validated that she had breast cancer.  She then asked me if she had to continue going to hemodialysis.  She actually asked me this twice during the interview.  She went on to talk about difficulty getting over the deaths of 2 brothers, her sister, and her sister-in-law.  She wanted to know how her sister , also a former resident here,had died.  I explained it was basically failure to thrive.  I asked twice whether she was having depression; she seemed to avoid answering directly.  She is on low-dose sertraline  25 mg daily.  Physical exam:  Pertinent or positive findings: She was initially asleep in the wheelchair after having eaten her lunch.  She did not exhibit snoring or apnea.  She did rouse easily but remained lethargic with markedly flat affect. Voice is slightly gravelly & hoarse. Proptosis and bilateral ptosis is suggested.  The lids were slightly crusted without evidence of cellulitis or conjunctivitis.  Teeth are coated.Heart sounds are markedly distant; rhythm appears irregular.  Breath sounds are decreased.  She did exhibit low-grade musical rhonchi on expiration.  Abdomen is protuberant.  Pedal pulses are not palpable.  She has 1/2+ edema at the sock line.  General appearance: Adequately nourished; no acute distress, increased work of breathing is present.   Lymphatic: No lymphadenopathy about the head, neck, axilla. Eyes: No conjunctival inflammation or lid edema is present. There is no scleral icterus. Ears:  External ear exam shows no significant lesions or deformities.   Nose:  External nasal examination shows no deformity or inflammation. Nasal mucosa are pink and moist without lesions, exudates Neck:  No thyromegaly,  masses, tenderness noted.    Heart:  No gallop, murmur, click, rub .  Lungs:  without wheezes, rales, rubs. Abdomen: Bowel sounds are normal. Abdomen is soft and nontender with no organomegaly, hernias, masses. GU: Deferred  Extremities:  No cyanosis, clubbing Neurologic exam :Balance, Rhomberg, finger to nose testing could not be completed due to clinical state Deep tendon reflexes are equal No significant lesions or rash.  See summary under each active problem in the Problem List with associated updated therapeutic plan

## 2023-10-19 NOTE — Assessment & Plan Note (Signed)
 Serial glucoses indicate adequate control; current A1c is 5.5% which represents the peak A1c over the past year.  No change in medical regimen indicated.

## 2023-10-19 NOTE — Assessment & Plan Note (Addendum)
 Serially her macrocytic anemia is stable with current H/H of 10.5/32.8.  MCV was 104.8; B12 level was therapeutic at 679 on 08/03/2023.  Continue to monitor. She continues HD Monday, Wednesday, and Friday for ESRD; GFR is stable.

## 2023-10-19 NOTE — Assessment & Plan Note (Addendum)
 10/19/2023 she she-twice during my visit whether she had to continue going to dialysis.  I explained that her kidneys were nonfunctioning and that hemodialysis twinges her blood of toxins.  Subsequent to this she began to talk about the multiple deaths in her family.

## 2023-10-19 NOTE — Assessment & Plan Note (Addendum)
 10/19/2023 she asked twice whether she needed to continue hemodialysis.  She also ruminated about multiple deaths in the family.  She would not answer me directly as to whether she was experiencing depression.  She is on low-dose sertraline .  Continue to monitor for progression.

## 2023-10-20 DIAGNOSIS — Z992 Dependence on renal dialysis: Secondary | ICD-10-CM | POA: Diagnosis not present

## 2023-10-20 DIAGNOSIS — N186 End stage renal disease: Secondary | ICD-10-CM | POA: Diagnosis not present

## 2023-10-21 ENCOUNTER — Inpatient Hospital Stay

## 2023-10-21 VITALS — BP 147/84 | HR 72 | Temp 98.2°F | Resp 18

## 2023-10-21 DIAGNOSIS — C9000 Multiple myeloma not having achieved remission: Secondary | ICD-10-CM

## 2023-10-21 DIAGNOSIS — Z5112 Encounter for antineoplastic immunotherapy: Secondary | ICD-10-CM | POA: Diagnosis not present

## 2023-10-21 LAB — COMPREHENSIVE METABOLIC PANEL WITH GFR
ALT: 14 U/L (ref 0–44)
AST: 22 U/L (ref 15–41)
Albumin: 3.7 g/dL (ref 3.5–5.0)
Alkaline Phosphatase: 84 U/L (ref 38–126)
Anion gap: 12 (ref 5–15)
BUN: 21 mg/dL (ref 8–23)
CO2: 28 mmol/L (ref 22–32)
Calcium: 9.5 mg/dL (ref 8.9–10.3)
Chloride: 95 mmol/L — ABNORMAL LOW (ref 98–111)
Creatinine, Ser: 5.07 mg/dL — ABNORMAL HIGH (ref 0.44–1.00)
GFR, Estimated: 8 mL/min — ABNORMAL LOW (ref >60)
Glucose, Bld: 88 mg/dL (ref 70–99)
Potassium: 4.2 mmol/L (ref 3.5–5.1)
Sodium: 135 mmol/L (ref 135–145)
Total Bilirubin: 0.4 mg/dL (ref 0.0–1.2)
Total Protein: 7.1 g/dL (ref 6.5–8.1)

## 2023-10-21 LAB — CBC WITH DIFFERENTIAL/PLATELET
Abs Immature Granulocytes: 0.01 K/uL (ref 0.00–0.07)
Basophils Absolute: 0 K/uL (ref 0.0–0.1)
Basophils Relative: 1 %
Eosinophils Absolute: 0.2 K/uL (ref 0.0–0.5)
Eosinophils Relative: 5 %
HCT: 31.5 % — ABNORMAL LOW (ref 36.0–46.0)
Hemoglobin: 10.1 g/dL — ABNORMAL LOW (ref 12.0–15.0)
Immature Granulocytes: 0 %
Lymphocytes Relative: 21 %
Lymphs Abs: 0.9 K/uL (ref 0.7–4.0)
MCH: 33.4 pg (ref 26.0–34.0)
MCHC: 32.1 g/dL (ref 30.0–36.0)
MCV: 104.3 fL — ABNORMAL HIGH (ref 80.0–100.0)
Monocytes Absolute: 0.6 K/uL (ref 0.1–1.0)
Monocytes Relative: 14 %
Neutro Abs: 2.4 K/uL (ref 1.7–7.7)
Neutrophils Relative %: 59 %
Platelets: 172 K/uL (ref 150–400)
RBC: 3.02 MIL/uL — ABNORMAL LOW (ref 3.87–5.11)
RDW: 15.2 % (ref 11.5–15.5)
WBC: 4 K/uL (ref 4.0–10.5)
nRBC: 0 % (ref 0.0–0.2)

## 2023-10-21 LAB — MAGNESIUM: Magnesium: 2.3 mg/dL (ref 1.7–2.4)

## 2023-10-21 MED ORDER — PROCHLORPERAZINE MALEATE 10 MG PO TABS
10.0000 mg | ORAL_TABLET | Freq: Once | ORAL | Status: AC
  Administered 2023-10-21: 10 mg via ORAL
  Filled 2023-10-21: qty 1

## 2023-10-21 MED ORDER — BORTEZOMIB CHEMO SQ INJECTION 3.5 MG (2.5MG/ML)
1.2000 mg/m2 | Freq: Once | INTRAMUSCULAR | Status: AC
  Administered 2023-10-21: 2 mg via SUBCUTANEOUS
  Filled 2023-10-21: qty 0.8

## 2023-10-21 NOTE — Progress Notes (Signed)
 Patient presents today for Velcade infusion. Patient is in satisfactory condition with no new complaints voiced.  Vital signs are stable.  Labs reviewed and all labs are within treatment parameters.  We will proceed with treatment per MD orders.    Treatment given today per MD orders. Tolerated infusion without adverse affects. Vital signs stable. No complaints at this time. Discharged from clinic via wheelchair in stable condition. Alert and oriented x 3. F/U with [facility] as scheduled.

## 2023-10-22 DIAGNOSIS — N186 End stage renal disease: Secondary | ICD-10-CM | POA: Diagnosis not present

## 2023-10-22 DIAGNOSIS — Z992 Dependence on renal dialysis: Secondary | ICD-10-CM | POA: Diagnosis not present

## 2023-10-25 DIAGNOSIS — N186 End stage renal disease: Secondary | ICD-10-CM | POA: Diagnosis not present

## 2023-10-25 DIAGNOSIS — Z992 Dependence on renal dialysis: Secondary | ICD-10-CM | POA: Diagnosis not present

## 2023-10-27 DIAGNOSIS — Z992 Dependence on renal dialysis: Secondary | ICD-10-CM | POA: Diagnosis not present

## 2023-10-27 DIAGNOSIS — N186 End stage renal disease: Secondary | ICD-10-CM | POA: Diagnosis not present

## 2023-10-28 ENCOUNTER — Inpatient Hospital Stay

## 2023-10-28 VITALS — BP 132/73 | HR 86 | Temp 96.7°F | Resp 18

## 2023-10-28 DIAGNOSIS — C9000 Multiple myeloma not having achieved remission: Secondary | ICD-10-CM

## 2023-10-28 DIAGNOSIS — Z5112 Encounter for antineoplastic immunotherapy: Secondary | ICD-10-CM | POA: Diagnosis not present

## 2023-10-28 LAB — CBC WITH DIFFERENTIAL/PLATELET
Abs Immature Granulocytes: 0.01 K/uL (ref 0.00–0.07)
Basophils Absolute: 0 K/uL (ref 0.0–0.1)
Basophils Relative: 1 %
Eosinophils Absolute: 0.2 K/uL (ref 0.0–0.5)
Eosinophils Relative: 5 %
HCT: 28.2 % — ABNORMAL LOW (ref 36.0–46.0)
Hemoglobin: 9 g/dL — ABNORMAL LOW (ref 12.0–15.0)
Immature Granulocytes: 0 %
Lymphocytes Relative: 17 %
Lymphs Abs: 0.8 K/uL (ref 0.7–4.0)
MCH: 33.6 pg (ref 26.0–34.0)
MCHC: 31.9 g/dL (ref 30.0–36.0)
MCV: 105.2 fL — ABNORMAL HIGH (ref 80.0–100.0)
Monocytes Absolute: 0.5 K/uL (ref 0.1–1.0)
Monocytes Relative: 11 %
Neutro Abs: 2.9 K/uL (ref 1.7–7.7)
Neutrophils Relative %: 66 %
Platelets: 174 K/uL (ref 150–400)
RBC: 2.68 MIL/uL — ABNORMAL LOW (ref 3.87–5.11)
RDW: 15.5 % (ref 11.5–15.5)
WBC: 4.5 K/uL (ref 4.0–10.5)
nRBC: 0 % (ref 0.0–0.2)

## 2023-10-28 LAB — COMPREHENSIVE METABOLIC PANEL WITH GFR
ALT: 13 U/L (ref 0–44)
AST: 22 U/L (ref 15–41)
Albumin: 3.6 g/dL (ref 3.5–5.0)
Alkaline Phosphatase: 83 U/L (ref 38–126)
Anion gap: 14 (ref 5–15)
BUN: 20 mg/dL (ref 8–23)
CO2: 26 mmol/L (ref 22–32)
Calcium: 9.3 mg/dL (ref 8.9–10.3)
Chloride: 97 mmol/L — ABNORMAL LOW (ref 98–111)
Creatinine, Ser: 5.06 mg/dL — ABNORMAL HIGH (ref 0.44–1.00)
GFR, Estimated: 8 mL/min — ABNORMAL LOW (ref >60)
Glucose, Bld: 181 mg/dL — ABNORMAL HIGH (ref 70–99)
Potassium: 4 mmol/L (ref 3.5–5.1)
Sodium: 137 mmol/L (ref 135–145)
Total Bilirubin: 0.5 mg/dL (ref 0.0–1.2)
Total Protein: 6.9 g/dL (ref 6.5–8.1)

## 2023-10-28 LAB — MAGNESIUM: Magnesium: 2.3 mg/dL (ref 1.7–2.4)

## 2023-10-28 MED ORDER — PROCHLORPERAZINE MALEATE 10 MG PO TABS
10.0000 mg | ORAL_TABLET | Freq: Once | ORAL | Status: AC
  Administered 2023-10-28: 10 mg via ORAL
  Filled 2023-10-28: qty 1

## 2023-10-28 MED ORDER — BORTEZOMIB CHEMO SQ INJECTION 3.5 MG (2.5MG/ML)
1.2000 mg/m2 | Freq: Once | INTRAMUSCULAR | Status: AC
  Administered 2023-10-28: 2 mg via SUBCUTANEOUS
  Filled 2023-10-28: qty 0.8

## 2023-10-28 NOTE — Progress Notes (Signed)
 Patient presents today for Velcade  infusion.  Patient is in satisfactory condition with no new complaints voiced.  Vital signs are stable.  Labs reviewed and all labs are within treatment parameters.  We will proceed with treatment per MD orders.    Treatment given today per MD orders. Tolerated infusion without adverse affects. Vital signs stable. No complaints at this time. Discharged from clinic via wheelchair in stable condition by [facility] staff. Alert and oriented x 3. F/U with [facility] as scheduled.

## 2023-10-28 NOTE — Patient Instructions (Signed)
 [facility] [place] - A DEPT OF [facility]  Discharge Instructions: Thank you for choosing [facility] to provide your oncology and hematology care.  If you have a lab appointment with the Cancer Center - please note that after April 8th, 2024, all labs will be drawn in the cancer center.  You do not have to check in or register with the main entrance as you have in the past but will complete your check-in in the cancer center.  Wear comfortable clothing and clothing appropriate for easy access to any Portacath or PICC line.   We strive to give you quality time with your provider. You may need to reschedule your appointment if you arrive late (15 or more minutes).  Arriving late affects you and other patients whose appointments are after yours.  Also, if you miss three or more appointments without notifying the office, you may be dismissed from the clinic at the provider's discretion.      For prescription refill requests, have your pharmacy contact our office and allow 72 hours for refills to be completed.    Today you received the following chemotherapy and/or immunotherapy agents Velcade   To help prevent nausea and vomiting after your treatment, we encourage you to take your nausea medication as directed.  Bortezomib Injection What is this medication? BORTEZOMIB (bor TEZ oh mib) treats lymphoma. It may also be used to treat multiple myeloma, a type of bone marrow cancer. It works by blocking a protein that causes cancer cells to grow and multiply. This helps to slow or stop the spread of cancer cells. This medicine may be used for other purposes; ask your health care provider or pharmacist if you have questions. COMMON BRAND NAME(S): BORUZU, Velcade What should I tell my care team before I take this medication? They need to know if you have any of these conditions: Dehydration Diabetes Heart disease Liver disease Tingling of the fingers or toes or  other nerve disorder An unusual or allergic reaction to bortezomib, other medications, foods, dyes, or preservatives If you or your partner are pregnant or trying to get pregnant Breastfeeding How should I use this medication? This medication is injected into a vein or under the skin. It is given by your care team in a hospital or clinic setting. Talk to your care team about the use of this medication in children. Special care may be needed. Overdosage: If you think you have taken too much of this medicine contact a poison control center or emergency room at once. NOTE: This medicine is only for you. Do not share this medicine with others. What if I miss a dose? Keep appointments for follow-up doses. It is important not to miss your dose. Call your care team if you are unable to keep an appointment. What may interact with this medication? Ketoconazole Rifampin This list may not describe all possible interactions. Give your health care provider a list of all the medicines, herbs, non-prescription drugs, or dietary supplements you use. Also tell them if you smoke, drink alcohol, or use illegal drugs. Some items may interact with your medicine. What should I watch for while using this medication? Your condition will be monitored carefully while you are receiving this medication. You may need blood work while taking this medication. This medication may affect your coordination, reaction time, or judgment. Do not drive or operate machinery until you know how this medication affects you. Sit up or stand slowly to  reduce the risk of dizzy or fainting spells. Drinking alcohol with this medication can increase the risk of these side effects. This medication may increase your risk of getting an infection. Call your care team for advice if you get a fever, chills, sore throat, or other symptoms of a cold or flu. Do not treat yourself. Try to avoid being around people who are sick. Check with your care team  if you have severe diarrhea, nausea, and vomiting, or if you sweat a lot. The loss of too much body fluid may make it dangerous for you to take this medication. Talk to your care team if you may be pregnant. Serious birth defects can occur if you take this medication during pregnancy and for 7 months after the last dose. You will need a negative pregnancy test before starting this medication. Contraception is recommended while taking this medication and for 7 months after the last dose. Your care team can help you find the option that works for you. If your partner can get pregnant, use a condom during sex while taking this medication and for 4 months after the last dose. Do not breastfeed while taking this medication and for 2 months after the last dose. This medication may cause infertility. Talk to your care team if you are concerned about your fertility. What side effects may I notice from receiving this medication? Side effects that you should report to your care team as soon as possible: Allergic reactions--skin rash, itching, hives, swelling of the face, lips, tongue, or throat Bleeding--bloody or black, tar-like stools, vomiting blood or brown material that looks like coffee grounds, red or dark brown urine, small red or purple spots on skin, unusual bruising or bleeding Bleeding in the brain--severe headache, stiff neck, confusion, dizziness, change in vision, numbness or weakness of the face, arm, or leg, trouble speaking, trouble walking, vomiting Bowel blockage--stomach cramping, unable to have a bowel movement or pass gas, loss of appetite, vomiting Heart failure--shortness of breath, swelling of the ankles, feet, or hands, sudden weight gain, unusual weakness or fatigue Infection--fever, chills, cough, sore throat, wounds that don't heal, pain or trouble when passing urine, general feeling of discomfort or being unwell Liver injury--right upper belly pain, loss of appetite, nausea,  light-colored stool, dark yellow or brown urine, yellowing skin or eyes, unusual weakness or fatigue Low blood pressure--dizziness, feeling faint or lightheaded, blurry vision Lung injury--shortness of breath or trouble breathing, cough, spitting up blood, chest pain, fever Pain, tingling, or numbness in the hands or feet Severe or prolonged diarrhea Stomach pain, bloody diarrhea, pale skin, unusual weakness or fatigue, decrease in the amount of urine, which may be signs of hemolytic uremic syndrome Sudden and severe headache, confusion, change in vision, seizures, which may be signs of posterior reversible encephalopathy syndrome (PRES) TTP--purple spots on the skin or inside the mouth, pale skin, yellowing skin or eyes, unusual weakness or fatigue, fever, fast or irregular heartbeat, confusion, change in vision, trouble speaking, trouble walking Tumor lysis syndrome (TLS)--nausea, vomiting, diarrhea, decrease in the amount of urine, dark urine, unusual weakness or fatigue, confusion, muscle pain or cramps, fast or irregular heartbeat, joint pain Side effects that usually do not require medical attention (report to your care team if they continue or are bothersome): Constipation Diarrhea Fatigue Loss of appetite Nausea This list may not describe all possible side effects. Call your doctor for medical advice about side effects. You may report side effects to FDA at 1-800-FDA-1088. Where should I keep  my medication? This medication is given in a hospital or clinic. It will not be stored at home. NOTE: This sheet is a summary. It may not cover all possible information. If you have questions about this medicine, talk to your doctor, pharmacist, or health care provider.  2024 Elsevier/Gold Standard (2021-07-29 00:00:00)  BELOW ARE SYMPTOMS THAT SHOULD BE REPORTED IMMEDIATELY: *FEVER GREATER THAN 100.4 F (38 C) OR HIGHER *CHILLS OR SWEATING *NAUSEA AND VOMITING THAT IS NOT CONTROLLED WITH YOUR  NAUSEA MEDICATION *UNUSUAL SHORTNESS OF BREATH *UNUSUAL BRUISING OR BLEEDING *URINARY PROBLEMS (pain or burning when urinating, or frequent urination) *BOWEL PROBLEMS (unusual diarrhea, constipation, pain near the anus) TENDERNESS IN MOUTH AND THROAT WITH OR WITHOUT PRESENCE OF ULCERS (sore throat, sores in mouth, or a toothache) UNUSUAL RASH, SWELLING OR PAIN  UNUSUAL VAGINAL DISCHARGE OR ITCHING   Items with * indicate a potential emergency and should be followed up as soon as possible or go to the Emergency Department if any problems should occur.  Please show the CHEMOTHERAPY ALERT CARD or IMMUNOTHERAPY ALERT CARD at check-in to the Emergency Department and triage nurse.  Should you have questions after your visit or need to cancel or reschedule your appointment, please contact [facility] [place] - A DEPT OF [facility] [phone]  and follow the prompts.  Office hours are 8:00 a.m. to 4:30 p.m. Monday - Friday. Please note that voicemails left after 4:00 p.m. may not be returned until the following business day.  We are closed weekends and major holidays. You have access to a nurse at all times for urgent questions. Please call the main number to the clinic [phone] and follow the prompts.  For any non-urgent questions, you may also contact your provider using MyChart. We now offer e-Visits for anyone 40 and older to request care online for non-urgent symptoms. For details visit [email or web address].   Also download the MyChart app! Go to the app store, search "MyChart", open the app, select [place], and log in with your MyChart username and password.

## 2023-10-29 DIAGNOSIS — Z992 Dependence on renal dialysis: Secondary | ICD-10-CM | POA: Diagnosis not present

## 2023-10-29 DIAGNOSIS — N186 End stage renal disease: Secondary | ICD-10-CM | POA: Diagnosis not present

## 2023-11-01 DIAGNOSIS — N186 End stage renal disease: Secondary | ICD-10-CM | POA: Diagnosis not present

## 2023-11-01 DIAGNOSIS — Z992 Dependence on renal dialysis: Secondary | ICD-10-CM | POA: Diagnosis not present

## 2023-11-02 DIAGNOSIS — Z992 Dependence on renal dialysis: Secondary | ICD-10-CM | POA: Diagnosis not present

## 2023-11-02 DIAGNOSIS — N186 End stage renal disease: Secondary | ICD-10-CM | POA: Diagnosis not present

## 2023-11-03 DIAGNOSIS — N186 End stage renal disease: Secondary | ICD-10-CM | POA: Diagnosis not present

## 2023-11-03 DIAGNOSIS — Z992 Dependence on renal dialysis: Secondary | ICD-10-CM | POA: Diagnosis not present

## 2023-11-04 ENCOUNTER — Inpatient Hospital Stay: Payer: Self-pay

## 2023-11-04 VITALS — BP 138/68 | HR 82 | Temp 97.4°F | Resp 20 | Wt 148.0 lb

## 2023-11-04 DIAGNOSIS — C9000 Multiple myeloma not having achieved remission: Secondary | ICD-10-CM

## 2023-11-04 DIAGNOSIS — Z5112 Encounter for antineoplastic immunotherapy: Secondary | ICD-10-CM | POA: Diagnosis not present

## 2023-11-04 LAB — CBC WITH DIFFERENTIAL/PLATELET
Abs Immature Granulocytes: 0.02 K/uL (ref 0.00–0.07)
Basophils Absolute: 0 K/uL (ref 0.0–0.1)
Basophils Relative: 0 %
Eosinophils Absolute: 0.2 K/uL (ref 0.0–0.5)
Eosinophils Relative: 4 %
HCT: 31.1 % — ABNORMAL LOW (ref 36.0–46.0)
Hemoglobin: 10.1 g/dL — ABNORMAL LOW (ref 12.0–15.0)
Immature Granulocytes: 0 %
Lymphocytes Relative: 18 %
Lymphs Abs: 0.9 K/uL (ref 0.7–4.0)
MCH: 34.1 pg — ABNORMAL HIGH (ref 26.0–34.0)
MCHC: 32.5 g/dL (ref 30.0–36.0)
MCV: 105.1 fL — ABNORMAL HIGH (ref 80.0–100.0)
Monocytes Absolute: 0.6 K/uL (ref 0.1–1.0)
Monocytes Relative: 13 %
Neutro Abs: 3.2 K/uL (ref 1.7–7.7)
Neutrophils Relative %: 65 %
Platelets: 177 K/uL (ref 150–400)
RBC: 2.96 MIL/uL — ABNORMAL LOW (ref 3.87–5.11)
RDW: 15.5 % (ref 11.5–15.5)
WBC: 4.9 K/uL (ref 4.0–10.5)
nRBC: 0 % (ref 0.0–0.2)

## 2023-11-04 LAB — COMPREHENSIVE METABOLIC PANEL WITH GFR
ALT: 14 U/L (ref 0–44)
AST: 21 U/L (ref 15–41)
Albumin: 3.7 g/dL (ref 3.5–5.0)
Alkaline Phosphatase: 85 U/L (ref 38–126)
Anion gap: 13 (ref 5–15)
BUN: 25 mg/dL — ABNORMAL HIGH (ref 8–23)
CO2: 27 mmol/L (ref 22–32)
Calcium: 9.6 mg/dL (ref 8.9–10.3)
Chloride: 96 mmol/L — ABNORMAL LOW (ref 98–111)
Creatinine, Ser: 5.28 mg/dL — ABNORMAL HIGH (ref 0.44–1.00)
GFR, Estimated: 7 mL/min — ABNORMAL LOW (ref >60)
Glucose, Bld: 159 mg/dL — ABNORMAL HIGH (ref 70–99)
Potassium: 3.9 mmol/L (ref 3.5–5.1)
Sodium: 136 mmol/L (ref 135–145)
Total Bilirubin: 0.5 mg/dL (ref 0.0–1.2)
Total Protein: 7.3 g/dL (ref 6.5–8.1)

## 2023-11-04 LAB — MAGNESIUM: Magnesium: 2.4 mg/dL (ref 1.7–2.4)

## 2023-11-04 MED ORDER — PROCHLORPERAZINE MALEATE 10 MG PO TABS
10.0000 mg | ORAL_TABLET | Freq: Once | ORAL | Status: AC
  Administered 2023-11-04: 10 mg via ORAL
  Filled 2023-11-04: qty 1

## 2023-11-04 MED ORDER — BORTEZOMIB CHEMO SQ INJECTION 3.5 MG (2.5MG/ML)
1.2000 mg/m2 | Freq: Once | INTRAMUSCULAR | Status: AC
  Administered 2023-11-04: 2 mg via SUBCUTANEOUS
  Filled 2023-11-04: qty 0.8

## 2023-11-04 NOTE — Progress Notes (Signed)
 Patient presents today for Velcade injection.  Patient is in satisfactory condition with no new complaints voiced.  Vital signs are stable.  Labs reviewed and all labs are within treatment parameters.  We will proceed with treatment per MD orders.    Patient tolerated Velcade injection with no complaints voiced.  Site clean and dry with no bruising or swelling noted.  No complaints of pain.  Discharged with vital signs stable and no signs or symptoms of distress noted.

## 2023-11-04 NOTE — Patient Instructions (Signed)
 [facility] [place] [facility]  Discharge Instructions: Thank you for choosing [facility] to provide your oncology and hematology care.  If you have a lab appointment with the Cancer Center - please note that after April 8th, 2024, all labs will be drawn in the cancer center.  You do not have to check in or register with the main entrance as you have in the past but will complete your check-in in the cancer center.  Wear comfortable clothing and clothing appropriate for easy access to any Portacath or PICC line.   We strive to give you quality time with your provider. You may need to reschedule your appointment if you arrive late (15 or more minutes).  Arriving late affects you and other patients whose appointments are after yours.  Also, if you miss three or more appointments without notifying the office, you may be dismissed from the clinic at the provider's discretion.      For prescription refill requests, have your pharmacy contact our office and allow 72 hours for refills to be completed.    Today you received the following chemotherapy and/or immunotherapy agents Velcade.  Bortezomib Injection What is this medication? BORTEZOMIB (bor TEZ oh mib) treats lymphoma. It may also be used to treat multiple myeloma, a type of bone marrow cancer. It works by blocking a protein that causes cancer cells to grow and multiply. This helps to slow or stop the spread of cancer cells. This medicine may be used for other purposes; ask your health care provider or pharmacist if you have questions. COMMON BRAND NAME(S): BORUZU, Velcade What should I tell my care team before I take this medication? They need to know if you have any of these conditions: Dehydration Diabetes Heart disease Liver disease Tingling of the fingers or toes or other nerve disorder An unusual or allergic reaction to bortezomib, other medications, foods, dyes, or preservatives If  you or your partner are pregnant or trying to get pregnant Breastfeeding How should I use this medication? This medication is injected into a vein or under the skin. It is given by your care team in a hospital or clinic setting. Talk to your care team about the use of this medication in children. Special care may be needed. Overdosage: If you think you have taken too much of this medicine contact a poison control center or emergency room at once. NOTE: This medicine is only for you. Do not share this medicine with others. What if I miss a dose? Keep appointments for follow-up doses. It is important not to miss your dose. Call your care team if you are unable to keep an appointment. What may interact with this medication? Ketoconazole Rifampin This list may not describe all possible interactions. Give your health care provider a list of all the medicines, herbs, non-prescription drugs, or dietary supplements you use. Also tell them if you smoke, drink alcohol, or use illegal drugs. Some items may interact with your medicine. What should I watch for while using this medication? Your condition will be monitored carefully while you are receiving this medication. You may need blood work while taking this medication. This medication may affect your coordination, reaction time, or judgment. Do not drive or operate machinery until you know how this medication affects you. Sit up or stand slowly to reduce the risk of dizzy or fainting spells. Drinking alcohol with this medication can increase the risk of these side effects.  This medication may increase your risk of getting an infection. Call your care team for advice if you get a fever, chills, sore throat, or other symptoms of a cold or flu. Do not treat yourself. Try to avoid being around people who are sick. Check with your care team if you have severe diarrhea, nausea, and vomiting, or if you sweat a lot. The loss of too much body fluid may make it  dangerous for you to take this medication. Talk to your care team if you may be pregnant. Serious birth defects can occur if you take this medication during pregnancy and for 7 months after the last dose. You will need a negative pregnancy test before starting this medication. Contraception is recommended while taking this medication and for 7 months after the last dose. Your care team can help you find the option that works for you. If your partner can get pregnant, use a condom during sex while taking this medication and for 4 months after the last dose. Do not breastfeed while taking this medication and for 2 months after the last dose. This medication may cause infertility. Talk to your care team if you are concerned about your fertility. What side effects may I notice from receiving this medication? Side effects that you should report to your care team as soon as possible: Allergic reactions--skin rash, itching, hives, swelling of the face, lips, tongue, or throat Bleeding--bloody or black, tar-like stools, vomiting blood or [name] material that looks like coffee grounds, red or dark [name] urine, small red or purple spots on skin, unusual bruising or bleeding Bleeding in the brain--severe headache, stiff neck, confusion, dizziness, change in vision, numbness or weakness of the face, arm, or leg, trouble speaking, trouble walking, vomiting Bowel blockage--stomach cramping, unable to have a bowel movement or pass gas, loss of appetite, vomiting Heart failure--shortness of breath, swelling of the ankles, feet, or hands, sudden weight gain, unusual weakness or fatigue Infection--fever, chills, cough, sore throat, wounds that don't heal, pain or trouble when passing urine, general feeling of discomfort or being unwell Liver injury--right upper belly pain, loss of appetite, nausea, light-colored stool, dark yellow or [name] urine, yellowing skin or eyes, unusual weakness or fatigue Low blood  pressure--dizziness, feeling faint or lightheaded, blurry vision Lung injury--shortness of breath or trouble breathing, cough, spitting up blood, chest pain, fever Pain, tingling, or numbness in the hands or feet Severe or prolonged diarrhea Stomach pain, bloody diarrhea, pale skin, unusual weakness or fatigue, decrease in the amount of urine, which may be signs of hemolytic uremic syndrome Sudden and severe headache, confusion, change in vision, seizures, which may be signs of posterior reversible encephalopathy syndrome (PRES) TTP--purple spots on the skin or inside the mouth, pale skin, yellowing skin or eyes, unusual weakness or fatigue, fever, fast or irregular heartbeat, confusion, change in vision, trouble speaking, trouble walking Tumor lysis syndrome (TLS)--nausea, vomiting, diarrhea, decrease in the amount of urine, dark urine, unusual weakness or fatigue, confusion, muscle pain or cramps, fast or irregular heartbeat, joint pain Side effects that usually do not require medical attention (report to your care team if they continue or are bothersome): Constipation Diarrhea Fatigue Loss of appetite Nausea This list may not describe all possible side effects. Call your doctor for medical advice about side effects. You may report side effects to FDA at 1-800-FDA-1088. Where should I keep my medication? This medication is given in a hospital or clinic. It will not be stored at home. NOTE: This sheet  is a summary. It may not cover all possible information. If you have questions about this medicine, talk to your doctor, pharmacist, or health care provider.  2024 Elsevier/Gold Standard (2021-07-29 00:00:00)       To help prevent nausea and vomiting after your treatment, we encourage you to take your nausea medication as directed.  BELOW ARE SYMPTOMS THAT SHOULD BE REPORTED IMMEDIATELY: *FEVER GREATER THAN 100.4 F (38 C) OR HIGHER *CHILLS OR SWEATING *NAUSEA AND VOMITING THAT IS NOT  CONTROLLED WITH YOUR NAUSEA MEDICATION *UNUSUAL SHORTNESS OF BREATH *UNUSUAL BRUISING OR BLEEDING *URINARY PROBLEMS (pain or burning when urinating, or frequent urination) *BOWEL PROBLEMS (unusual diarrhea, constipation, pain near the anus) TENDERNESS IN MOUTH AND THROAT WITH OR WITHOUT PRESENCE OF ULCERS (sore throat, sores in mouth, or a toothache) UNUSUAL RASH, SWELLING OR PAIN  UNUSUAL VAGINAL DISCHARGE OR ITCHING   Items with * indicate a potential emergency and should be followed up as soon as possible or go to the Emergency Department if any problems should occur.  Please show the CHEMOTHERAPY ALERT CARD or IMMUNOTHERAPY ALERT CARD at check-in to the Emergency Department and triage nurse.  Should you have questions after your visit or need to cancel or reschedule your appointment, please contact [facility]ER CTR  - A DEPT OF Eligha Bridegroom [facility] [phone]  and follow the prompts.  Office hours are 8:00 a.m. to 4:30 p.m. Monday - Friday. Please note that voicemails left after 4:00 p.m. may not be returned until the following business day.  We are closed weekends and major holidays. You have access to a nurse at all times for urgent questions. Please call the main number to the clinic [phone] and follow the prompts.  For any non-urgent questions, you may also contact your provider using MyChart. We now offer e-Visits for anyone 29 and older to request care online for non-urgent symptoms. For details visit [email or web address].   Also download the MyChart app! Go to the app store, search "MyChart", open the app, select [place], and log in with your MyChart username and password.

## 2023-11-05 DIAGNOSIS — Z992 Dependence on renal dialysis: Secondary | ICD-10-CM | POA: Diagnosis not present

## 2023-11-05 DIAGNOSIS — N186 End stage renal disease: Secondary | ICD-10-CM | POA: Diagnosis not present

## 2023-11-08 DIAGNOSIS — Z992 Dependence on renal dialysis: Secondary | ICD-10-CM | POA: Diagnosis not present

## 2023-11-08 DIAGNOSIS — N186 End stage renal disease: Secondary | ICD-10-CM | POA: Diagnosis not present

## 2023-11-10 ENCOUNTER — Other Ambulatory Visit: Payer: Self-pay | Admitting: *Deleted

## 2023-11-10 DIAGNOSIS — N186 End stage renal disease: Secondary | ICD-10-CM | POA: Diagnosis not present

## 2023-11-10 DIAGNOSIS — Z992 Dependence on renal dialysis: Secondary | ICD-10-CM | POA: Diagnosis not present

## 2023-11-10 NOTE — Progress Notes (Signed)
 Patient Care Team: Carly Barnie RAMAN, NP as PCP - General (Geriatric Medicine) Carly Vina GAILS, MD as PCP - Cardiology (Cardiology) Carly [name], MD as Consulting Physician (Gastroenterology) Carly Paula, MD (Inactive) as Consulting Physician (Nephrology) [facility]  Clinic Day:  11/11/2023  Referring physician: [name], MD   CHIEF COMPLAINT:  CC: IgA lambda plasma cell myeloma, stage II, standard risk   Carly Wood 88 y.o. female was transferred to my care after her prior physician has left.   ASSESSMENT & PLAN:   Assessment & Plan: Carly Wood  is a 88 y.o. female with IgA lambda plasma cell myeloma  Assessment & Plan Multiple myeloma not having achieved remission (HCC) R-ISS-stage II IgA lambda plasma cell myeloma Patient has always been on Velcade  and dexamethasone .  Revlimid was not started because of poor renal function Multiple myeloma labs slowly trending up.  - Reviewed labs from today: CMP: Creatinine: 5.47-stable, normal LFTs, CBC: Hemoglobin: 9.8, WBC: 3.9, platelets: 166. -Multiple myeloma labs reviewed from 09/23/2023: Kappa free light chain: 200, lambda free light chain: 616.2, ratio: 3.081.  SPEP: M spike: 0.4, IFE: Faint band in the beta region suspicious for monoclonal immunoglobulin. -Labs are more or less stable.  Considering patient has very poor functional status, will continue to monitor at this time - Continue Velcade  weekly - If disease continues to progress, will discuss goals of care with the patient.  If patient wishes to continue treatment, can consider single agent daratumumab monthly along with Velcade .  Return to clinic in 8 weeks with labs ESRD (end stage renal disease) (HCC) Continue hemodialysis Monday Wednesday Friday Normocytic anemia Likely secondary to multiple myeloma, ESRD, iron  deficiency  - Obtain iron  panel with next blood draw.  And replace as needed.    The patient  understands the plans discussed today and is in agreement with them.  She knows to contact our office if she develops concerns prior to her next appointment.  90 minutes of total time was spent for this patient encounter, including preparation,review of records,  face-to-face counseling with the patient and coordination of care, physical exam, and documentation of the encounter.    Carly Wood,acting as a Neurosurgeon for Carly Dry, MD.,have documented all relevant documentation on the behalf of Carly Dry, MD,as directed by  Carly Dry, MD while in the presence of Carly Dry, MD.  I, Carly Dry MD, have reviewed the above documentation for accuracy and completeness, and I agree with the above.     Carly Dry, MD  [facility] [place] - A DEPT OF Carly Wood Dept: [phone] Dept Fax: [phone]   Orders Placed This Encounter  Procedures   Kappa/lambda light chains    Standing Status:   Future    Expected Date:   12/09/2023    Expiration Date:   03/08/2024   Protein electrophoresis, serum    Standing Status:   Future    Expected Date:   12/09/2023    Expiration Date:   03/08/2024   Ferritin    Standing Status:   Future    Expected Date:   11/18/2023    Expiration Date:   02/16/2024   Folate    Standing Status:   Future    Expected Date:   11/18/2023    Expiration Date:   02/16/2024   Vitamin B12    Standing Status:  Future    Expected Date:   11/18/2023    Expiration Date:   02/16/2024   Iron  and TIBC    Standing Status:   Future    Expected Date:   11/18/2023    Expiration Date:   02/16/2024     ONCOLOGY HISTORY:   I have reviewed her chart and materials related to her cancer extensively and collaborated history with the patient. Summary of oncologic history is as follows:   IgA lambda plasma cell myeloma, stage II, standard risk    -05/13/2016: Initial Myeloma labs. M spike 0.7 g. IgA 1,257. FLC ratio 4.99 with elevated lambda light chains at 339.9 and elevated kappa light chains at 68.1.  Patient refused bone marrow biopsy. -08/29/2018: Myeloma labs at diagnosis. M spike 0.9 g.  Kappa light chains 148, lambda light chains 1132, ratio 7.64.  LDH normal.  Beta-2  microglobulin 13.5.  -09/20/2018: Bone Marrow Biopsy: Pathology: Plasma cell myeloma. The marrow is normocellular for age but with increased monoclonal plasma cells (15% aspirate, 30% CD138 immunohistochemistry). By light chain in situ hybridization the plasma cells are lambda restricted. There are scattered lymphoid aggregates which consist of predominately T-cells (CD3, CD5) and scattered B-cells (CD20). CD10, bcl-6, and cyclinD1 are negative. There is a mild erythroid hyperplasia. Megakaryocytes exhibit a spectrum of maturation without tight clusters.  -Flow Cytometry: No monoclonal B-cell or phenotypically aberrant T-cell population identified.   -Normal Female Karyotype  -Myeloma FISH Panel: Normal -10/04/2018: Bone Survey: No aggressive lytic or sclerotic osseous lesion.  -10/11/2018-current: Velcade  and dexamethasone . Revlimid not added to regimen due to impaired renal function.     Diagnosis: Stage I IDC of the right breast, ER/PR positive, HER-2 negative   -02/16/2014: Abnormal mammogram with mass noted in right breast. Subsequent US  showed hypoechoic mass, which was biopsied and found to be benign.  -09/12/2014: Diagnostic Mammogram and US  of right breast: Spiculated mass in upper outer right breast appears slightly increased in size from prior exam.  -10/02/2014: Right breast biopsy with additional right breast excision.  Biopsy Pathology: Invasive ductal carcinoma,high grade solid type. 1.1 cm tumor size involving lateral resection margin.  -DCIS, Solid type -Excision Pathology: Invasive high-grade ductal carcinoma, 0.9 cm DCIS, cribriform and solid  types. 0.9 cm tumor size involving the anterior and deep resection margins.  Comment: The tumor cells are ER positive (90%), PR positive (30%), and Her2 negative (2+). 2 different invasive tumor cells were counted during Her2/NEU Gene amplification. pT1C pNx Mx -10/24/2014: Right mastectomy with sentinel lymph node excision.  Pathology: Focal DCIS with negative margins. Zero of seven lymph nodes identified positive for metastatic carcinoma (0/7). pT1cpN0 -12/28/2014-11/22/2015: Taxol/herceptin  weekly X 12, Herceptin  every 21 days for 52 weeks -03/11/2015-current: Anastrozole  -06/08/2016: Neratinib  for 1 year  Current Treatment:  Weekly Velcade  for multiple myeloma and anastrozole  for breast cancer  INTERVAL HISTORY:   Carly Wood is here today for follow up and establish care with me.   She experiences intermittent numbness in her left hand without constant numbness or tingling. No numbness or tingling in the feet. She denies any pain. No hot flashes or arthralgias reported  She has no other complaints today.  She lives at the [facility].  I have reviewed the past medical history, past surgical history, social history and family history with the patient and they are unchanged from previous note.  ALLERGIES:  is allergic to ace inhibitors, angiotensin receptor blockers, and penicillins.  MEDICATIONS:  Current Outpatient Medications  Medication Sig Dispense Refill  acetaminophen  (TYLENOL ) 325 MG tablet Take 650 mg by mouth 3 (three) times daily.     acyclovir  (ZOVIRAX ) 200 MG capsule Take 200 mg by mouth in the morning. (0800)     anastrozole  (ARIMIDEX ) 1 MG tablet TAKE 1 TABLET BY MOUTH DAILY 30 tablet 6   aspirin  EC 81 MG tablet Take 1 tablet (81 mg total) by mouth daily. Swallow whole.     atorvastatin  (LIPITOR) 40 MG tablet Take 1 tablet (40 mg total) by mouth daily.     Calcium  Carb-Cholecalciferol  (CALCIUM  + VITAMIN D3) 500-5 MG-MCG TABS Take 1 tablet by mouth daily.      melatonin 5 MG TABS Take 5 mg by mouth at bedtime.     multivitamin (RENA-VIT) TABS tablet Take 1 tablet by mouth at bedtime.     Nutritional Supplements (ENSURE ENLIVE PO) Take 237 mLs by mouth every evening.     ondansetron  (ZOFRAN -ODT) 4 MG disintegrating tablet Take 4 mg by mouth daily before breakfast.     pantoprazole  (PROTONIX ) 40 MG tablet Take 40 mg by mouth 2 (two) times daily.     sennosides-docusate sodium (SENOKOT-S) 8.6-50 MG tablet Take 1 tablet by mouth 2 (two) times daily.     sevelamer  carbonate (RENVELA ) 800 MG tablet Take 800 mg by mouth daily.     sucralfate  (CARAFATE ) 1 GM/10ML suspension Take 10 mLs by mouth 2 (two) times daily.     sertraline  (ZOLOFT ) 25 MG tablet Take 25 mg by mouth daily. Was taking 50 mg but it stopped on 05-25-2023 (Patient not taking: Reported on 11/11/2023)     No current facility-administered medications for this visit.   Facility-Administered Medications Ordered in Other Visits  Medication Dose Route Frequency Provider Last Rate Last Admin   lanreotide acetate  (SOMATULINE DEPOT ) 120 MG/0.5ML injection            lanreotide acetate  (SOMATULINE DEPOT ) 120 MG/0.5ML injection            lanreotide acetate  (SOMATULINE DEPOT ) 120 MG/0.5ML injection            lanreotide acetate  (SOMATULINE DEPOT ) 120 MG/0.5ML injection            octreotide  (SANDOSTATIN  LAR) 30 MG IM injection            octreotide  (SANDOSTATIN  LAR) 30 MG IM injection             REVIEW OF SYSTEMS:   Constitutional: Denies fevers, chills or abnormal weight loss Eyes: Denies blurriness of vision Ears, nose, mouth, throat, and face: Denies mucositis or sore throat Respiratory: Denies cough, dyspnea or wheezes Cardiovascular: Denies palpitation, chest discomfort or lower extremity swelling Gastrointestinal:  Denies nausea, heartburn or change in bowel habits Skin: Denies abnormal skin rashes Lymphatics: Denies new lymphadenopathy or easy bruising Neurological:Denies numbness,  tingling or new weaknesses Behavioral/Psych: Mood is stable, no new changes  All other systems were reviewed with the patient and are negative.   VITALS:  Blood pressure (!) 134/59, pulse 97, temperature 98.8 F (37.1 C), temperature source Tympanic, resp. rate 18, weight 146 lb (66.2 kg), SpO2 100%.  Wt Readings from Last 3 Encounters:  11/11/23 146 lb (66.2 kg)  11/04/23 148 lb (67.1 kg)  10/14/23 148 lb (67.1 kg)    Body mass index is 25.86 kg/m.  Performance status (ECOG): 3 - Symptomatic, >50% confined to bed  PHYSICAL EXAM:   GENERAL:alert, no distress and comfortable, in a wheelchair SKIN: skin color, texture, turgor are normal, no rashes or significant  lesions LYMPH:  no palpable lymphadenopathy in the cervical, axillary or inguinal LUNGS: clear to auscultation and percussion with normal breathing effort HEART: regular rate & rhythm and no murmurs and no lower extremity edema ABDOMEN:abdomen soft, non-tender and normal bowel sounds Musculoskeletal:no cyanosis of digits and no clubbing  NEURO: alert & oriented x 3 with fluent speech, no focal motor/sensory deficits  LABORATORY DATA:  I have reviewed the data as listed   Lab Results  Component Value Date   WBC 3.9 (L) 11/11/2023   NEUTROABS 2.5 11/11/2023   HGB 9.8 (L) 11/11/2023   HCT 30.7 (L) 11/11/2023   MCV 108.5 (H) 11/11/2023   PLT 166 11/11/2023     Chemistry      Component Value Date/Time   NA 136 11/11/2023 1028   K 4.0 11/11/2023 1028   CL 94 (L) 11/11/2023 1028   CO2 24 11/11/2023 1028   BUN 22 11/11/2023 1028   CREATININE 5.47 (H) 11/11/2023 1028      Component Value Date/Time   CALCIUM  9.4 11/11/2023 1028   CALCIUM  9.0 10/18/2018 1059   ALKPHOS 83 11/11/2023 1028   AST 23 11/11/2023 1028   ALT 13 11/11/2023 1028   BILITOT 0.7 11/11/2023 1028       RADIOGRAPHIC STUDIES: I have personally reviewed the radiological images as listed and agreed with the findings in the report.  None new  to review

## 2023-11-11 ENCOUNTER — Inpatient Hospital Stay: Payer: Self-pay

## 2023-11-11 ENCOUNTER — Inpatient Hospital Stay: Admitting: Oncology

## 2023-11-11 ENCOUNTER — Other Ambulatory Visit: Payer: Self-pay | Admitting: Oncology

## 2023-11-11 ENCOUNTER — Inpatient Hospital Stay: Payer: Self-pay | Attending: Hematology

## 2023-11-11 VITALS — BP 134/59 | HR 97 | Temp 98.8°F | Resp 18 | Wt 146.0 lb

## 2023-11-11 DIAGNOSIS — D649 Anemia, unspecified: Secondary | ICD-10-CM | POA: Diagnosis not present

## 2023-11-11 DIAGNOSIS — Z992 Dependence on renal dialysis: Secondary | ICD-10-CM | POA: Diagnosis not present

## 2023-11-11 DIAGNOSIS — Z79899 Other long term (current) drug therapy: Secondary | ICD-10-CM | POA: Insufficient documentation

## 2023-11-11 DIAGNOSIS — Z7982 Long term (current) use of aspirin: Secondary | ICD-10-CM | POA: Diagnosis not present

## 2023-11-11 DIAGNOSIS — C9000 Multiple myeloma not having achieved remission: Secondary | ICD-10-CM

## 2023-11-11 DIAGNOSIS — N186 End stage renal disease: Secondary | ICD-10-CM | POA: Diagnosis not present

## 2023-11-11 DIAGNOSIS — Z5112 Encounter for antineoplastic immunotherapy: Secondary | ICD-10-CM | POA: Diagnosis not present

## 2023-11-11 DIAGNOSIS — Z7969 Long term (current) use of other immunomodulators and immunosuppressants: Secondary | ICD-10-CM | POA: Insufficient documentation

## 2023-11-11 DIAGNOSIS — Z79811 Long term (current) use of aromatase inhibitors: Secondary | ICD-10-CM | POA: Diagnosis not present

## 2023-11-11 DIAGNOSIS — D0510 Intraductal carcinoma in situ of unspecified breast: Secondary | ICD-10-CM | POA: Insufficient documentation

## 2023-11-11 DIAGNOSIS — Z79624 Long term (current) use of inhibitors of nucleotide synthesis: Secondary | ICD-10-CM | POA: Insufficient documentation

## 2023-11-11 LAB — CBC WITH DIFFERENTIAL/PLATELET
Abs Immature Granulocytes: 0.01 K/uL (ref 0.00–0.07)
Basophils Absolute: 0 K/uL (ref 0.0–0.1)
Basophils Relative: 1 %
Eosinophils Absolute: 0.2 K/uL (ref 0.0–0.5)
Eosinophils Relative: 5 %
HCT: 30.7 % — ABNORMAL LOW (ref 36.0–46.0)
Hemoglobin: 9.8 g/dL — ABNORMAL LOW (ref 12.0–15.0)
Immature Granulocytes: 0 %
Lymphocytes Relative: 17 %
Lymphs Abs: 0.7 K/uL (ref 0.7–4.0)
MCH: 34.6 pg — ABNORMAL HIGH (ref 26.0–34.0)
MCHC: 31.9 g/dL (ref 30.0–36.0)
MCV: 108.5 fL — ABNORMAL HIGH (ref 80.0–100.0)
Monocytes Absolute: 0.5 K/uL (ref 0.1–1.0)
Monocytes Relative: 13 %
Neutro Abs: 2.5 K/uL (ref 1.7–7.7)
Neutrophils Relative %: 64 %
Platelets: 166 K/uL (ref 150–400)
RBC: 2.83 MIL/uL — ABNORMAL LOW (ref 3.87–5.11)
RDW: 15.8 % — ABNORMAL HIGH (ref 11.5–15.5)
WBC: 3.9 K/uL — ABNORMAL LOW (ref 4.0–10.5)
nRBC: 0 % (ref 0.0–0.2)

## 2023-11-11 LAB — COMPREHENSIVE METABOLIC PANEL WITH GFR
ALT: 13 U/L (ref 0–44)
AST: 23 U/L (ref 15–41)
Albumin: 3.6 g/dL (ref 3.5–5.0)
Alkaline Phosphatase: 83 U/L (ref 38–126)
Anion gap: 18 — ABNORMAL HIGH (ref 5–15)
BUN: 22 mg/dL (ref 8–23)
CO2: 24 mmol/L (ref 22–32)
Calcium: 9.4 mg/dL (ref 8.9–10.3)
Chloride: 94 mmol/L — ABNORMAL LOW (ref 98–111)
Creatinine, Ser: 5.47 mg/dL — ABNORMAL HIGH (ref 0.44–1.00)
GFR, Estimated: 7 mL/min — ABNORMAL LOW (ref >60)
Glucose, Bld: 170 mg/dL — ABNORMAL HIGH (ref 70–99)
Potassium: 4 mmol/L (ref 3.5–5.1)
Sodium: 136 mmol/L (ref 135–145)
Total Bilirubin: 0.7 mg/dL (ref 0.0–1.2)
Total Protein: 7 g/dL (ref 6.5–8.1)

## 2023-11-11 LAB — MAGNESIUM: Magnesium: 2.3 mg/dL (ref 1.7–2.4)

## 2023-11-11 MED ORDER — BORTEZOMIB CHEMO SQ INJECTION 3.5 MG (2.5MG/ML)
1.2000 mg/m2 | Freq: Once | INTRAMUSCULAR | Status: AC
  Administered 2023-11-11: 2 mg via SUBCUTANEOUS
  Filled 2023-11-11: qty 0.8

## 2023-11-11 MED ORDER — PROCHLORPERAZINE MALEATE 10 MG PO TABS
10.0000 mg | ORAL_TABLET | Freq: Once | ORAL | Status: AC
  Administered 2023-11-11: 10 mg via ORAL
  Filled 2023-11-11: qty 1

## 2023-11-11 NOTE — Patient Instructions (Signed)

## 2023-11-11 NOTE — Assessment & Plan Note (Addendum)
 Continue hemodialysis Monday Wednesday Friday

## 2023-11-11 NOTE — Progress Notes (Signed)
 Patient okay for Velcade  per Dr. Davonna. Patient tolerated Velcade  injection with no complaints voiced. Lab work reviewed. See MAR for details. Injection site clean and dry with no bruising or swelling noted. Patient stable during and after injection. Band aid applied. VSS. Patient left in satisfactory condition with no s/s of distress noted.

## 2023-11-11 NOTE — Patient Instructions (Signed)
 [facility] [place] - A DEPT OF [place]  Discharge Instructions: Thank you for choosing [facility] to provide your oncology and hematology care.  If you have a lab appointment with the Cancer Center - please note that after April 8th, 2024, all labs will be drawn in the cancer center.  You do not have to check in or register with the main entrance as you have in the past but will complete your check-in in the cancer center.  Wear comfortable clothing and clothing appropriate for easy access to any Portacath or PICC line.   We strive to give you quality time with your provider. You may need to reschedule your appointment if you arrive late (15 or more minutes).  Arriving late affects you and other patients whose appointments are after yours.  Also, if you miss three or more appointments without notifying the office, you may be dismissed from the clinic at the provider's discretion.      For prescription refill requests, have your pharmacy contact our office and allow 72 hours for refills to be completed.    Today you received the following chemotherapy and/or immunotherapy agents Velcade  and Complazine. Return as scheduled.   To help prevent nausea and vomiting after your treatment, we encourage you to take your nausea medication as directed.  BELOW ARE SYMPTOMS THAT SHOULD BE REPORTED IMMEDIATELY: *FEVER GREATER THAN 100.4 F (38 C) OR HIGHER *CHILLS OR SWEATING *NAUSEA AND VOMITING THAT IS NOT CONTROLLED WITH YOUR NAUSEA MEDICATION *UNUSUAL SHORTNESS OF BREATH *UNUSUAL BRUISING OR BLEEDING *URINARY PROBLEMS (pain or burning when urinating, or frequent urination) *BOWEL PROBLEMS (unusual diarrhea, constipation, pain near the anus) TENDERNESS IN MOUTH AND THROAT WITH OR WITHOUT PRESENCE OF ULCERS (sore throat, sores in mouth, or a toothache) UNUSUAL RASH, SWELLING OR PAIN  UNUSUAL VAGINAL DISCHARGE OR ITCHING   Items with * indicate a potential  emergency and should be followed up as soon as possible or go to the Emergency Department if any problems should occur.  Please show the CHEMOTHERAPY ALERT CARD or IMMUNOTHERAPY ALERT CARD at check-in to the Emergency Department and triage nurse.  Should you have questions after your visit or need to cancel or reschedule your appointment, please contact [facility] [place] [facility] - A DEPT OF [name] [phone]  and follow the prompts.  Office hours are 8:00 a.m. to 4:30 p.m. Monday - Friday. Please note that voicemails left after 4:00 p.m. may not be returned until the following business day.  We are closed weekends and major holidays. You have access to a nurse at all times for urgent questions. Please call the main number to the clinic [phone] and follow the prompts.  For any non-urgent questions, you may also contact your provider using MyChart. We now offer e-Visits for anyone 37 and older to request care online for non-urgent symptoms. For details visit [email or web address].   Also download the MyChart app! Go to the app store, search MyChart, open the app, select [place], and log in with your MyChart username and password.

## 2023-11-11 NOTE — Assessment & Plan Note (Signed)
 R-ISS-stage II IgA lambda plasma cell myeloma Patient has always been on Velcade  and dexamethasone .  Revlimid was not started because of poor renal function Multiple myeloma labs slowly trending up.  - Reviewed labs from today: CMP: Creatinine: 5.47-stable, normal LFTs, CBC: Hemoglobin: 9.8, WBC: 3.9, platelets: 166. -Multiple myeloma labs reviewed from 09/23/2023: Kappa free light chain: 200, lambda free light chain: 616.2, ratio: 3.081.  SPEP: M spike: 0.4, IFE: Faint band in the beta region suspicious for monoclonal immunoglobulin. -Labs are more or less stable.  Considering patient has very poor functional status, will continue to monitor at this time - Continue Velcade  weekly - If disease continues to progress, will discuss goals of care with the patient.  If patient wishes to continue treatment, can consider single agent daratumumab monthly along with Velcade .  Return to clinic in 8 weeks with labs

## 2023-11-11 NOTE — Assessment & Plan Note (Signed)
 History of stage I IDC, ER/PR positive, HER2 positive by IHC S/p chemotherapy with carboplatin and paclitaxel on maintenance Herceptin  for 1 year Also was on maintenance neratinib  for 1 year On maintenance anastrozole  since 2017 and tolerating well  - Unsure at this time why patient is on extended anastrozole  treatment. - Will discuss discontinuing at next visit

## 2023-11-11 NOTE — Assessment & Plan Note (Signed)
 Likely secondary to multiple myeloma, ESRD, iron  deficiency  - Obtain iron  panel with next blood draw.  And replace as needed.

## 2023-11-11 NOTE — Progress Notes (Signed)
 Patient has been examined by Dr. Davonna. Vital signs and labs have been reviewed by MD - ANC, Creatinine, LFTs, hemoglobin, and platelets have been reviewed by M.D. - pt may proceed with treatment.  Primary RN and pharmacy notified.

## 2023-11-12 ENCOUNTER — Inpatient Hospital Stay: Payer: Self-pay

## 2023-11-12 ENCOUNTER — Inpatient Hospital Stay: Payer: Self-pay | Admitting: Oncology

## 2023-11-12 DIAGNOSIS — N186 End stage renal disease: Secondary | ICD-10-CM | POA: Diagnosis not present

## 2023-11-12 DIAGNOSIS — Z992 Dependence on renal dialysis: Secondary | ICD-10-CM | POA: Diagnosis not present

## 2023-11-15 DIAGNOSIS — N186 End stage renal disease: Secondary | ICD-10-CM | POA: Diagnosis not present

## 2023-11-15 DIAGNOSIS — Z992 Dependence on renal dialysis: Secondary | ICD-10-CM | POA: Diagnosis not present

## 2023-11-16 ENCOUNTER — Inpatient Hospital Stay: Payer: Self-pay | Admitting: Oncology

## 2023-11-16 ENCOUNTER — Inpatient Hospital Stay: Payer: Self-pay

## 2023-11-17 DIAGNOSIS — Z992 Dependence on renal dialysis: Secondary | ICD-10-CM | POA: Diagnosis not present

## 2023-11-17 DIAGNOSIS — N186 End stage renal disease: Secondary | ICD-10-CM | POA: Diagnosis not present

## 2023-11-18 ENCOUNTER — Inpatient Hospital Stay: Payer: Self-pay

## 2023-11-18 VITALS — BP 128/70 | HR 93 | Temp 97.0°F | Resp 18 | Wt 146.0 lb

## 2023-11-18 DIAGNOSIS — C9000 Multiple myeloma not having achieved remission: Secondary | ICD-10-CM | POA: Diagnosis not present

## 2023-11-18 DIAGNOSIS — D0510 Intraductal carcinoma in situ of unspecified breast: Secondary | ICD-10-CM | POA: Diagnosis not present

## 2023-11-18 DIAGNOSIS — Z5112 Encounter for antineoplastic immunotherapy: Secondary | ICD-10-CM | POA: Diagnosis not present

## 2023-11-18 DIAGNOSIS — Z79811 Long term (current) use of aromatase inhibitors: Secondary | ICD-10-CM | POA: Diagnosis not present

## 2023-11-18 DIAGNOSIS — D649 Anemia, unspecified: Secondary | ICD-10-CM | POA: Diagnosis not present

## 2023-11-18 DIAGNOSIS — Z79624 Long term (current) use of inhibitors of nucleotide synthesis: Secondary | ICD-10-CM | POA: Diagnosis not present

## 2023-11-18 DIAGNOSIS — Z7982 Long term (current) use of aspirin: Secondary | ICD-10-CM | POA: Diagnosis not present

## 2023-11-18 DIAGNOSIS — N186 End stage renal disease: Secondary | ICD-10-CM | POA: Diagnosis not present

## 2023-11-18 DIAGNOSIS — Z7969 Long term (current) use of other immunomodulators and immunosuppressants: Secondary | ICD-10-CM | POA: Diagnosis not present

## 2023-11-18 LAB — COMPREHENSIVE METABOLIC PANEL WITH GFR
ALT: 12 U/L (ref 0–44)
AST: 20 U/L (ref 15–41)
Albumin: 3.6 g/dL (ref 3.5–5.0)
Alkaline Phosphatase: 79 U/L (ref 38–126)
Anion gap: 16 — ABNORMAL HIGH (ref 5–15)
BUN: 22 mg/dL (ref 8–23)
CO2: 27 mmol/L (ref 22–32)
Calcium: 9.5 mg/dL (ref 8.9–10.3)
Chloride: 95 mmol/L — ABNORMAL LOW (ref 98–111)
Creatinine, Ser: 5.59 mg/dL — ABNORMAL HIGH (ref 0.44–1.00)
GFR, Estimated: 7 mL/min — ABNORMAL LOW (ref >60)
Glucose, Bld: 158 mg/dL — ABNORMAL HIGH (ref 70–99)
Potassium: 3.8 mmol/L (ref 3.5–5.1)
Sodium: 138 mmol/L (ref 135–145)
Total Bilirubin: 0.3 mg/dL (ref 0.0–1.2)
Total Protein: 7 g/dL (ref 6.5–8.1)

## 2023-11-18 LAB — CBC WITH DIFFERENTIAL/PLATELET
Abs Immature Granulocytes: 0.01 K/uL (ref 0.00–0.07)
Basophils Absolute: 0 K/uL (ref 0.0–0.1)
Basophils Relative: 1 %
Eosinophils Absolute: 0.2 K/uL (ref 0.0–0.5)
Eosinophils Relative: 5 %
HCT: 29.5 % — ABNORMAL LOW (ref 36.0–46.0)
Hemoglobin: 9.5 g/dL — ABNORMAL LOW (ref 12.0–15.0)
Immature Granulocytes: 0 %
Lymphocytes Relative: 21 %
Lymphs Abs: 0.7 K/uL (ref 0.7–4.0)
MCH: 34.8 pg — ABNORMAL HIGH (ref 26.0–34.0)
MCHC: 32.2 g/dL (ref 30.0–36.0)
MCV: 108.1 fL — ABNORMAL HIGH (ref 80.0–100.0)
Monocytes Absolute: 0.4 K/uL (ref 0.1–1.0)
Monocytes Relative: 13 %
Neutro Abs: 2.2 K/uL (ref 1.7–7.7)
Neutrophils Relative %: 60 %
Platelets: 178 K/uL (ref 150–400)
RBC: 2.73 MIL/uL — ABNORMAL LOW (ref 3.87–5.11)
RDW: 15.3 % (ref 11.5–15.5)
WBC: 3.5 K/uL — ABNORMAL LOW (ref 4.0–10.5)
nRBC: 0 % (ref 0.0–0.2)

## 2023-11-18 LAB — FERRITIN: Ferritin: 448 ng/mL — ABNORMAL HIGH (ref 11–307)

## 2023-11-18 LAB — IRON AND TIBC
Iron: 46 ug/dL (ref 28–170)
Saturation Ratios: 41 % — ABNORMAL HIGH (ref 10.4–31.8)
TIBC: 114 ug/dL — ABNORMAL LOW (ref 250–450)
UIBC: 68 ug/dL

## 2023-11-18 LAB — MAGNESIUM: Magnesium: 2.4 mg/dL (ref 1.7–2.4)

## 2023-11-18 LAB — FOLATE: Folate: >20 ng/mL (ref >5.9)

## 2023-11-18 LAB — VITAMIN B12: Vitamin B-12: 797 pg/mL (ref 180–914)

## 2023-11-18 MED ORDER — PROCHLORPERAZINE MALEATE 10 MG PO TABS
10.0000 mg | ORAL_TABLET | Freq: Once | ORAL | Status: AC
  Administered 2023-11-18: 10 mg via ORAL
  Filled 2023-11-18: qty 1

## 2023-11-18 MED ORDER — BORTEZOMIB CHEMO SQ INJECTION 3.5 MG (2.5MG/ML)
1.2000 mg/m2 | Freq: Once | INTRAMUSCULAR | Status: AC
  Administered 2023-11-18: 2 mg via SUBCUTANEOUS
  Filled 2023-11-18: qty 0.8

## 2023-11-18 NOTE — Patient Instructions (Signed)
 [facility] [place] [facility]  Discharge Instructions: Thank you for choosing [facility] to provide your oncology and hematology care.  If you have a lab appointment with the Cancer Center - please note that after April 8th, 2024, all labs will be drawn in the cancer center.  You do not have to check in or register with the main entrance as you have in the past but will complete your check-in in the cancer center.  Wear comfortable clothing and clothing appropriate for easy access to any Portacath or PICC line.   We strive to give you quality time with your provider. You may need to reschedule your appointment if you arrive late (15 or more minutes).  Arriving late affects you and other patients whose appointments are after yours.  Also, if you miss three or more appointments without notifying the office, you may be dismissed from the clinic at the provider's discretion.      For prescription refill requests, have your pharmacy contact our office and allow 72 hours for refills to be completed.    Today you received the following chemotherapy and/or immunotherapy agents Velcade.  Bortezomib Injection What is this medication? BORTEZOMIB (bor TEZ oh mib) treats lymphoma. It may also be used to treat multiple myeloma, a type of bone marrow cancer. It works by blocking a protein that causes cancer cells to grow and multiply. This helps to slow or stop the spread of cancer cells. This medicine may be used for other purposes; ask your health care provider or pharmacist if you have questions. COMMON BRAND NAME(S): BORUZU, Velcade What should I tell my care team before I take this medication? They need to know if you have any of these conditions: Dehydration Diabetes Heart disease Liver disease Tingling of the fingers or toes or other nerve disorder An unusual or allergic reaction to bortezomib, other medications, foods, dyes, or preservatives If  you or your partner are pregnant or trying to get pregnant Breastfeeding How should I use this medication? This medication is injected into a vein or under the skin. It is given by your care team in a hospital or clinic setting. Talk to your care team about the use of this medication in children. Special care may be needed. Overdosage: If you think you have taken too much of this medicine contact a poison control center or emergency room at once. NOTE: This medicine is only for you. Do not share this medicine with others. What if I miss a dose? Keep appointments for follow-up doses. It is important not to miss your dose. Call your care team if you are unable to keep an appointment. What may interact with this medication? Ketoconazole Rifampin This list may not describe all possible interactions. Give your health care provider a list of all the medicines, herbs, non-prescription drugs, or dietary supplements you use. Also tell them if you smoke, drink alcohol, or use illegal drugs. Some items may interact with your medicine. What should I watch for while using this medication? Your condition will be monitored carefully while you are receiving this medication. You may need blood work while taking this medication. This medication may affect your coordination, reaction time, or judgment. Do not drive or operate machinery until you know how this medication affects you. Sit up or stand slowly to reduce the risk of dizzy or fainting spells. Drinking alcohol with this medication can increase the risk of these side effects.  This medication may increase your risk of getting an infection. Call your care team for advice if you get a fever, chills, sore throat, or other symptoms of a cold or flu. Do not treat yourself. Try to avoid being around people who are sick. Check with your care team if you have severe diarrhea, nausea, and vomiting, or if you sweat a lot. The loss of too much body fluid may make it  dangerous for you to take this medication. Talk to your care team if you may be pregnant. Serious birth defects can occur if you take this medication during pregnancy and for 7 months after the last dose. You will need a negative pregnancy test before starting this medication. Contraception is recommended while taking this medication and for 7 months after the last dose. Your care team can help you find the option that works for you. If your partner can get pregnant, use a condom during sex while taking this medication and for 4 months after the last dose. Do not breastfeed while taking this medication and for 2 months after the last dose. This medication may cause infertility. Talk to your care team if you are concerned about your fertility. What side effects may I notice from receiving this medication? Side effects that you should report to your care team as soon as possible: Allergic reactions--skin rash, itching, hives, swelling of the face, lips, tongue, or throat Bleeding--bloody or black, tar-like stools, vomiting blood or [name] material that looks like coffee grounds, red or dark [name] urine, small red or purple spots on skin, unusual bruising or bleeding Bleeding in the brain--severe headache, stiff neck, confusion, dizziness, change in vision, numbness or weakness of the face, arm, or leg, trouble speaking, trouble walking, vomiting Bowel blockage--stomach cramping, unable to have a bowel movement or pass gas, loss of appetite, vomiting Heart failure--shortness of breath, swelling of the ankles, feet, or hands, sudden weight gain, unusual weakness or fatigue Infection--fever, chills, cough, sore throat, wounds that don't heal, pain or trouble when passing urine, general feeling of discomfort or being unwell Liver injury--right upper belly pain, loss of appetite, nausea, light-colored stool, dark yellow or [name] urine, yellowing skin or eyes, unusual weakness or fatigue Low blood  pressure--dizziness, feeling faint or lightheaded, blurry vision Lung injury--shortness of breath or trouble breathing, cough, spitting up blood, chest pain, fever Pain, tingling, or numbness in the hands or feet Severe or prolonged diarrhea Stomach pain, bloody diarrhea, pale skin, unusual weakness or fatigue, decrease in the amount of urine, which may be signs of hemolytic uremic syndrome Sudden and severe headache, confusion, change in vision, seizures, which may be signs of posterior reversible encephalopathy syndrome (PRES) TTP--purple spots on the skin or inside the mouth, pale skin, yellowing skin or eyes, unusual weakness or fatigue, fever, fast or irregular heartbeat, confusion, change in vision, trouble speaking, trouble walking Tumor lysis syndrome (TLS)--nausea, vomiting, diarrhea, decrease in the amount of urine, dark urine, unusual weakness or fatigue, confusion, muscle pain or cramps, fast or irregular heartbeat, joint pain Side effects that usually do not require medical attention (report to your care team if they continue or are bothersome): Constipation Diarrhea Fatigue Loss of appetite Nausea This list may not describe all possible side effects. Call your doctor for medical advice about side effects. You may report side effects to FDA at 1-800-FDA-1088. Where should I keep my medication? This medication is given in a hospital or clinic. It will not be stored at home. NOTE: This sheet  is a summary. It may not cover all possible information. If you have questions about this medicine, talk to your doctor, pharmacist, or health care provider.  2024 Elsevier/Gold Standard (2021-07-29 00:00:00)       To help prevent nausea and vomiting after your treatment, we encourage you to take your nausea medication as directed.  BELOW ARE SYMPTOMS THAT SHOULD BE REPORTED IMMEDIATELY: *FEVER GREATER THAN 100.4 F (38 C) OR HIGHER *CHILLS OR SWEATING *NAUSEA AND VOMITING THAT IS NOT  CONTROLLED WITH YOUR NAUSEA MEDICATION *UNUSUAL SHORTNESS OF BREATH *UNUSUAL BRUISING OR BLEEDING *URINARY PROBLEMS (pain or burning when urinating, or frequent urination) *BOWEL PROBLEMS (unusual diarrhea, constipation, pain near the anus) TENDERNESS IN MOUTH AND THROAT WITH OR WITHOUT PRESENCE OF ULCERS (sore throat, sores in mouth, or a toothache) UNUSUAL RASH, SWELLING OR PAIN  UNUSUAL VAGINAL DISCHARGE OR ITCHING   Items with * indicate a potential emergency and should be followed up as soon as possible or go to the Emergency Department if any problems should occur.  Please show the CHEMOTHERAPY ALERT CARD or IMMUNOTHERAPY ALERT CARD at check-in to the Emergency Department and triage nurse.  Should you have questions after your visit or need to cancel or reschedule your appointment, please contact [facility]ER CTR  - A DEPT OF Eligha Bridegroom [facility] [phone]  and follow the prompts.  Office hours are 8:00 a.m. to 4:30 p.m. Monday - Friday. Please note that voicemails left after 4:00 p.m. may not be returned until the following business day.  We are closed weekends and major holidays. You have access to a nurse at all times for urgent questions. Please call the main number to the clinic [phone] and follow the prompts.  For any non-urgent questions, you may also contact your provider using MyChart. We now offer e-Visits for anyone 29 and older to request care online for non-urgent symptoms. For details visit [email or web address].   Also download the MyChart app! Go to the app store, search "MyChart", open the app, select [place], and log in with your MyChart username and password.

## 2023-11-18 NOTE — Progress Notes (Signed)
 Patient presents today for Velcade  injection. Lab work within parameters for treatment. Creatinine 5.59. Treatment conditions state disregard value patient is actively on dialysis. Vital signs within parameters for treatment.

## 2023-11-18 NOTE — Progress Notes (Signed)
Treatment given today per MD orders. Tolerated infusion without adverse affects. Vital signs stable. No complaints at this time. Discharged from clinic via wheel chair in stable condition. Alert and oriented x 3. F/U with [facility] as scheduled.  

## 2023-11-19 DIAGNOSIS — N186 End stage renal disease: Secondary | ICD-10-CM | POA: Diagnosis not present

## 2023-11-19 DIAGNOSIS — Z992 Dependence on renal dialysis: Secondary | ICD-10-CM | POA: Diagnosis not present

## 2023-11-22 DIAGNOSIS — Z992 Dependence on renal dialysis: Secondary | ICD-10-CM | POA: Diagnosis not present

## 2023-11-22 DIAGNOSIS — N186 End stage renal disease: Secondary | ICD-10-CM | POA: Diagnosis not present

## 2023-11-24 DIAGNOSIS — N186 End stage renal disease: Secondary | ICD-10-CM | POA: Diagnosis not present

## 2023-11-24 DIAGNOSIS — Z992 Dependence on renal dialysis: Secondary | ICD-10-CM | POA: Diagnosis not present

## 2023-11-25 ENCOUNTER — Inpatient Hospital Stay: Payer: Self-pay

## 2023-11-25 VITALS — BP 140/82 | HR 80 | Temp 96.2°F | Resp 20

## 2023-11-25 DIAGNOSIS — Z7969 Long term (current) use of other immunomodulators and immunosuppressants: Secondary | ICD-10-CM | POA: Diagnosis not present

## 2023-11-25 DIAGNOSIS — D649 Anemia, unspecified: Secondary | ICD-10-CM | POA: Diagnosis not present

## 2023-11-25 DIAGNOSIS — Z79624 Long term (current) use of inhibitors of nucleotide synthesis: Secondary | ICD-10-CM | POA: Diagnosis not present

## 2023-11-25 DIAGNOSIS — Z5112 Encounter for antineoplastic immunotherapy: Secondary | ICD-10-CM | POA: Diagnosis not present

## 2023-11-25 DIAGNOSIS — C9000 Multiple myeloma not having achieved remission: Secondary | ICD-10-CM

## 2023-11-25 DIAGNOSIS — N186 End stage renal disease: Secondary | ICD-10-CM | POA: Diagnosis not present

## 2023-11-25 DIAGNOSIS — Z79811 Long term (current) use of aromatase inhibitors: Secondary | ICD-10-CM | POA: Diagnosis not present

## 2023-11-25 DIAGNOSIS — Z7982 Long term (current) use of aspirin: Secondary | ICD-10-CM | POA: Diagnosis not present

## 2023-11-25 DIAGNOSIS — D0510 Intraductal carcinoma in situ of unspecified breast: Secondary | ICD-10-CM | POA: Diagnosis not present

## 2023-11-25 LAB — CBC WITH DIFFERENTIAL/PLATELET
Abs Immature Granulocytes: 0 K/uL (ref 0.00–0.07)
Basophils Absolute: 0 K/uL (ref 0.0–0.1)
Basophils Relative: 1 %
Eosinophils Absolute: 0.2 K/uL (ref 0.0–0.5)
Eosinophils Relative: 4 %
HCT: 32 % — ABNORMAL LOW (ref 36.0–46.0)
Hemoglobin: 10.3 g/dL — ABNORMAL LOW (ref 12.0–15.0)
Immature Granulocytes: 0 %
Lymphocytes Relative: 17 %
Lymphs Abs: 0.7 K/uL (ref 0.7–4.0)
MCH: 34.6 pg — ABNORMAL HIGH (ref 26.0–34.0)
MCHC: 32.2 g/dL (ref 30.0–36.0)
MCV: 107.4 fL — ABNORMAL HIGH (ref 80.0–100.0)
Monocytes Absolute: 0.5 K/uL (ref 0.1–1.0)
Monocytes Relative: 13 %
Neutro Abs: 2.6 K/uL (ref 1.7–7.7)
Neutrophils Relative %: 65 %
Platelets: 178 K/uL (ref 150–400)
RBC: 2.98 MIL/uL — ABNORMAL LOW (ref 3.87–5.11)
RDW: 15.1 % (ref 11.5–15.5)
WBC: 4 K/uL (ref 4.0–10.5)
nRBC: 0 % (ref 0.0–0.2)

## 2023-11-25 LAB — COMPREHENSIVE METABOLIC PANEL WITH GFR
ALT: 14 U/L (ref 0–44)
AST: 24 U/L (ref 15–41)
Albumin: 3.7 g/dL (ref 3.5–5.0)
Alkaline Phosphatase: 78 U/L (ref 38–126)
Anion gap: 17 — ABNORMAL HIGH (ref 5–15)
BUN: 22 mg/dL (ref 8–23)
CO2: 27 mmol/L (ref 22–32)
Calcium: 9.5 mg/dL (ref 8.9–10.3)
Chloride: 92 mmol/L — ABNORMAL LOW (ref 98–111)
Creatinine, Ser: 5.47 mg/dL — ABNORMAL HIGH (ref 0.44–1.00)
GFR, Estimated: 7 mL/min — ABNORMAL LOW (ref >60)
Glucose, Bld: 116 mg/dL — ABNORMAL HIGH (ref 70–99)
Potassium: 4.1 mmol/L (ref 3.5–5.1)
Sodium: 136 mmol/L (ref 135–145)
Total Bilirubin: 0.4 mg/dL (ref 0.0–1.2)
Total Protein: 7.3 g/dL (ref 6.5–8.1)

## 2023-11-25 LAB — MAGNESIUM: Magnesium: 2.3 mg/dL (ref 1.7–2.4)

## 2023-11-25 MED ORDER — BORTEZOMIB CHEMO SQ INJECTION 3.5 MG (2.5MG/ML)
1.2000 mg/m2 | Freq: Once | INTRAMUSCULAR | Status: AC
  Administered 2023-11-25: 2 mg via SUBCUTANEOUS
  Filled 2023-11-25: qty 0.8

## 2023-11-25 MED ORDER — PROCHLORPERAZINE MALEATE 10 MG PO TABS
10.0000 mg | ORAL_TABLET | Freq: Once | ORAL | Status: AC
  Administered 2023-11-25: 10 mg via ORAL
  Filled 2023-11-25: qty 1

## 2023-11-25 NOTE — Patient Instructions (Signed)
 [facility] [place] - [facility]  Discharge Instructions: Thank you for choosing [facility] to provide your oncology and hematology care.  If you have a lab appointment with the Cancer Center - please note that after April 8th, 2024, all labs will be drawn in the cancer center.  You do not have to check in or register with the main entrance as you have in the past but will complete your check-in in the cancer center.  Wear comfortable clothing and clothing appropriate for easy access to any Portacath or PICC line.   We strive to give you quality time with your provider. You may need to reschedule your appointment if you arrive late (15 or more minutes).  Arriving late affects you and other patients whose appointments are after yours.  Also, if you miss three or more appointments without notifying the office, you may be dismissed from the clinic at the provider's discretion.      For prescription refill requests, have your pharmacy contact our office and allow 72 hours for refills to be completed.    Today you received the following chemotherapy and/or immunotherapy agents Velcade      To help prevent nausea and vomiting after your treatment, we encourage you to take your nausea medication as directed.  BELOW ARE SYMPTOMS THAT SHOULD BE REPORTED IMMEDIATELY: *FEVER GREATER THAN 100.4 F (38 C) OR HIGHER *CHILLS OR SWEATING *NAUSEA AND VOMITING THAT IS NOT CONTROLLED WITH YOUR NAUSEA MEDICATION *UNUSUAL SHORTNESS OF BREATH *UNUSUAL BRUISING OR BLEEDING *URINARY PROBLEMS (pain or burning when urinating, or frequent urination) *BOWEL PROBLEMS (unusual diarrhea, constipation, pain near the anus) TENDERNESS IN MOUTH AND THROAT WITH OR WITHOUT PRESENCE OF ULCERS (sore throat, sores in mouth, or a toothache) UNUSUAL RASH, SWELLING OR PAIN  UNUSUAL VAGINAL DISCHARGE OR ITCHING   Items with * indicate a potential emergency and should be followed up  as soon as possible or go to the Emergency Department if any problems should occur.  Please show the CHEMOTHERAPY ALERT CARD or IMMUNOTHERAPY ALERT CARD at check-in to the Emergency Department and triage nurse.  Should you have questions after your visit or need to cancel or reschedule your appointment, please contact [facility] [place] - A DEPT OF [facility] [phone]  and follow the prompts.  Office hours are 8:00 a.m. to 4:30 p.m. Monday - Friday. Please note that voicemails left after 4:00 p.m. may not be returned until the following business day.  We are closed weekends and major holidays. You have access to a nurse at all times for urgent questions. Please call the main number to the clinic [phone] and follow the prompts.  For any non-urgent questions, you may also contact your provider using MyChart. We now offer e-Visits for anyone 44 and older to request care online for non-urgent symptoms. For details visit [email or web address].   Also download the MyChart app! Go to the app store, search "MyChart", open the app, select [place], and log in with your MyChart username and password.

## 2023-11-25 NOTE — Progress Notes (Signed)
 Patient presents today for Velcade  injection per providers order.  Vital signs and labs within parameters for injection.  Stable during administration without incident; injection site WNL; see MAR for injection details.  Patient tolerated procedure well and without incident.  No questions or complaints noted at this time.

## 2023-11-26 DIAGNOSIS — Z992 Dependence on renal dialysis: Secondary | ICD-10-CM | POA: Diagnosis not present

## 2023-11-26 DIAGNOSIS — N186 End stage renal disease: Secondary | ICD-10-CM | POA: Diagnosis not present

## 2023-11-29 DIAGNOSIS — Z992 Dependence on renal dialysis: Secondary | ICD-10-CM | POA: Diagnosis not present

## 2023-11-29 DIAGNOSIS — N186 End stage renal disease: Secondary | ICD-10-CM | POA: Diagnosis not present

## 2023-11-29 DIAGNOSIS — E119 Type 2 diabetes mellitus without complications: Secondary | ICD-10-CM | POA: Diagnosis not present

## 2023-11-30 ENCOUNTER — Non-Acute Institutional Stay (SKILLED_NURSING_FACILITY): Payer: Self-pay | Admitting: Adult Health

## 2023-11-30 ENCOUNTER — Encounter: Payer: Self-pay | Admitting: Adult Health

## 2023-11-30 DIAGNOSIS — C50911 Malignant neoplasm of unspecified site of right female breast: Secondary | ICD-10-CM

## 2023-11-30 DIAGNOSIS — D649 Anemia, unspecified: Secondary | ICD-10-CM | POA: Diagnosis not present

## 2023-11-30 DIAGNOSIS — M810 Age-related osteoporosis without current pathological fracture: Secondary | ICD-10-CM | POA: Diagnosis not present

## 2023-11-30 NOTE — Progress Notes (Unsigned)
 Location:  [facility] Room Number: 149/W Place of Service:  SNF (31)   CODE STATUS: DNR  Allergies  Allergen Reactions   Ace Inhibitors Other (See Comments) and Cough    Tongue swelling Angioedema    Angiotensin Receptor Blockers Other (See Comments)    Angioedema with ACE-I   Penicillins Other (See Comments)    Unknown reaction    Chief Complaint  Patient presents with   Medical Management of Chronic Issues    Routine Visit, needs to discuss Covid vaccine and Medicare Annual Wellness visit    HPI:    Past Medical History:  Diagnosis Date   Anemia     chronic macrocytic anemia   Anxiety    Chronic kidney disease    Chronic renal disease, stage 4, severely decreased glomerular filtration rate (GFR) between 15-29 mL/min/1.73 square meter (HCC) 08/22/2015   Complication of anesthesia    delirious after Breast Surgery   Dementia (HCC)    mild   Depression    Diabetes mellitus with ESRD (end-stage renal disease) (HCC)    type II   Dysphagia    Dyspnea    with activity   GERD (gastroesophageal reflux disease)    Glaucoma    Hyperlipidemia    Hypertension    Multiple myeloma (HCC)    Pneumonia    Stage 1 infiltrating ductal carcinoma of right female breast (HCC) 08/21/2015   ER+ PR+ HER 2 neu + (3+) T1cN0     Past Surgical History:  Procedure Laterality Date   A/V FISTULAGRAM Right 07/05/2020   Procedure: A/V Fistulagram;  Surgeon: Harvey Carlin BRAVO, MD;  [facility];  Service: Cardiovascular;  Laterality: Right;   AV FISTULA PLACEMENT Left 11/22/2017   Procedure: ARTERIOVENOUS (AV) FISTULA CREATION LEFT ARM;  Surgeon: Harvey Carlin BRAVO, MD;  Location: [facility] OR;  Service: Vascular;  Laterality: Left;   AV FISTULA PLACEMENT Right 04/04/2020   Procedure: RIGHT ARM ARTERIOVENOUS FISTULA CREATION;  Surgeon: Oris Krystal FALCON, MD;  Location: [facility] ORS;  Service: Vascular;  Laterality: Right;   AV FISTULA PLACEMENT Right 08/20/2020    Procedure: ARTERIOVENOUS (AV) FISTULA LIGATION RIGHT ARM;  Surgeon: Harvey Carlin BRAVO, MD;  Location: [facility] OR;  Service: Vascular;  Laterality: Right;   BIOPSY  08/07/2016   Procedure: BIOPSY;  Surgeon: Shaaron [name], MD;  Location: [facility];  Service: Endoscopy;;  gastric ulcer biopsy   COLONOSCOPY     ESOPHAGOGASTRODUODENOSCOPY N/A 08/07/2016   LA Grade A esophagitis s/p dilation, small hiatal hernia, multiple gastric ulcers and erosions, duodenal erosions s/p biopsy. Negative H.pylori    ESOPHAGOGASTRODUODENOSCOPY N/A 11/27/2016   normal esophagus, previously noted gastric ulcers completely healed, normal duodenum.    ESOPHAGOGASTRODUODENOSCOPY (EGD) WITH PROPOFOL  N/A 07/23/2020   Procedure: ESOPHAGOGASTRODUODENOSCOPY (EGD) WITH PROPOFOL ;  Surgeon: Golda Claudis PENNER, MD;  [facility];  Service: Endoscopy;  Laterality: N/A;   FISTULA SUPERFICIALIZATION Left 02/14/2018   Procedure: FISTULA SUPERFICIALIZATION LEFT ARM;  Surgeon: Eliza Lonni RAMAN, MD;  Location: [facility] OR;  Service: Vascular;  Laterality: Left;   FRACTURE SURGERY Right    ankle   HOT HEMOSTASIS  07/23/2020   Procedure: HOT HEMOSTASIS (ARGON PLASMA COAGULATION/BICAP);  Surgeon: Golda Claudis PENNER, MD;  [facility];  Service: Endoscopy;;   INTRAMEDULLARY (IM) NAIL INTERTROCHANTERIC Right 07/12/2020   Procedure: INTRAMEDULLARY (IM) NAIL INTERTROCHANTRIC;  Surgeon: Onesimo Oneil LABOR, MD;  Location: [facility];  Service: Orthopedics;  Laterality: Right;   IR  FLUORO GUIDE CV LINE LEFT  12/21/2022   IR US  GUIDE VASC ACCESS LEFT  12/21/2022   MALONEY DILATION N/A 08/07/2016   Procedure: AGAPITO DILATION;  Surgeon: Shaaron [name], MD;  [facility];  Service: Endoscopy;  Laterality: N/A;   MASTECTOMY, PARTIAL Right    PERIPHERAL VASCULAR BALLOON ANGIOPLASTY Left 07/13/2019   Procedure: PERIPHERAL VASCULAR BALLOON ANGIOPLASTY;  Surgeon: Gretta Lonni PARAS, MD;  [facility];  Service:  Cardiovascular;  Laterality: Left;  arm fistulogram   PERIPHERAL VASCULAR BALLOON ANGIOPLASTY Right 05/22/2020   Procedure: PERIPHERAL VASCULAR BALLOON ANGIOPLASTY;  Surgeon: Magda Debby SAILOR, MD;  [facility];  Service: Cardiovascular;  Laterality: Right;  arm fistula   PERIPHERAL VASCULAR BALLOON ANGIOPLASTY Right 07/05/2020   Procedure: PERIPHERAL VASCULAR BALLOON ANGIOPLASTY;  Surgeon: Harvey Carlin BRAVO, MD;  [facility];  Service: Cardiovascular;  Laterality: Right;  arm fistula   PORT-A-CATH REMOVAL Left 11/22/2017   Procedure: REMOVAL PORT-A-CATH LEFT CHEST;  Surgeon: Harvey Carlin BRAVO, MD;  Location: [facility] OR;  Service: Vascular;  Laterality: Left;   RETINAL DETACHMENT SURGERY Right    SCLEROTHERAPY  07/23/2020   Procedure: SCLEROTHERAPY;  Surgeon: Golda Claudis PENNER, MD;  [facility];  Service: Endoscopy;;   TUNNELLED CATHETER EXCHANGE N/A 08/16/2023   Procedure: TUNNELLED CATHETER EXCHANGE;  Surgeon: Magda Debby SAILOR, MD;  Location: [facility];  Service: Cardiovascular;  Laterality: N/A;   UPPER EXTREMITY VENOGRAPHY N/A 08/22/2020   Procedure: LEFT UPPER & CENTRAL VENOGRAPHY;  Surgeon: Gretta Lonni PARAS, MD;  [facility];  Service: Cardiovascular;  Laterality: N/A;    Social History   Socioeconomic History   Marital status: Single    Spouse name: Not on file   Number of children: Not on file   Years of education: Not on file   Highest education level: Not on file  Occupational History   Occupation: retired   Tobacco Use   Smoking status: Never   Smokeless tobacco: Never  Vaping Use   Vaping status: Never Used  Substance and Sexual Activity   Alcohol  use: No    Alcohol /week: 0.0 standard drinks of alcohol    Drug use: No   Sexual activity: Never  Other Topics Concern   Not on file  Social History Narrative   Long term resident of SNF    Social Drivers of Health   Financial Resource Strain: Low Risk   (01/09/2020)   Overall Financial Resource Strain (CARDIA)    Difficulty of Paying Living Expenses: Not very hard  Food Insecurity: Patient Unable To Answer (05/24/2023)   Hunger Vital Sign    Worried About Running Out of Food in the Last Year: Patient unable to answer    Ran Out of Food in the Last Year: Patient unable to answer  Transportation Needs: Patient Unable To Answer (05/24/2023)   PRAPARE - Transportation    Lack of Transportation (Medical): Patient unable to answer    Lack of Transportation (Non-Medical): Patient unable to answer  Physical Activity: Inactive (01/09/2020)   Exercise Vital Sign    Days of Exercise per Week: 0 days    Minutes of Exercise per Session: 0 min  Stress: No Stress Concern Present (01/09/2020)   [employer] of Occupational Health - Occupational Stress Questionnaire    Feeling of Stress : Not at all  Social Connections: Patient Unable To Answer (05/24/2023)   Social Connection and Isolation Panel    Frequency of  Communication with Friends and Family: Patient unable to answer    Frequency of Social Gatherings with Friends and Family: Patient unable to answer    Attends Religious Services: Patient unable to answer    Active Member of Clubs or Organizations: Patient unable to answer    Attends Banker Meetings: Patient unable to answer    Marital Status: Patient unable to answer  Intimate Partner Violence: Patient Unable To Answer (05/24/2023)   Humiliation, Afraid, Rape, and Kick questionnaire    Fear of Current or Ex-Partner: Patient unable to answer    Emotionally Abused: Patient unable to answer    Physically Abused: Patient unable to answer    Sexually Abused: Patient unable to answer   Family History  Problem Relation Age of Onset   Multiple myeloma Sister    Brain cancer Sister    Dementia Mother        died at 10   Stroke Mother    Heart failure Mother    Diabetes Mother    Heart disease Father    Prostate cancer Brother     Colon cancer Neg Hx       VITAL SIGNS BP 138/84   Pulse 86   Temp (!) 97.3 F (36.3 C)   Resp 18   Ht 5' 3 (1.6 m)   Wt 148 lb 6.4 oz (67.3 kg)   SpO2 100%   BMI 26.29 kg/m   Outpatient Encounter Medications as of 11/30/2023  Medication Sig   acetaminophen  (TYLENOL ) 325 MG tablet Take 650 mg by mouth 3 (three) times daily.   acyclovir  (ZOVIRAX ) 200 MG capsule Take 200 mg by mouth in the morning. (0800)   anastrozole  (ARIMIDEX ) 1 MG tablet TAKE 1 TABLET BY MOUTH DAILY   aspirin  EC 81 MG tablet Take 1 tablet (81 mg total) by mouth daily. Swallow whole.   atorvastatin  (LIPITOR) 40 MG tablet Take 1 tablet (40 mg total) by mouth daily.   Calcium  Carb-Cholecalciferol  (CALCIUM  + VITAMIN D3) 500-5 MG-MCG TABS Take 1 tablet by mouth daily.   melatonin 5 MG TABS Take 5 mg by mouth at bedtime.   multivitamin (RENA-VIT) TABS tablet Take 1 tablet by mouth at bedtime.   Nutritional Supplements (ENSURE ENLIVE PO) Take 237 mLs by mouth every evening.   ondansetron  (ZOFRAN -ODT) 4 MG disintegrating tablet Take 4 mg by mouth daily before breakfast.   pantoprazole  (PROTONIX ) 40 MG tablet Take 40 mg by mouth 2 (two) times daily.   sennosides-docusate sodium (SENOKOT-S) 8.6-50 MG tablet Take 1 tablet by mouth 2 (two) times daily.   sevelamer  carbonate (RENVELA ) 800 MG tablet Take 800 mg by mouth daily.   sucralfate  (CARAFATE ) 1 GM/10ML suspension Take 10 mLs by mouth 2 (two) times daily.   sertraline  (ZOLOFT ) 25 MG tablet Take 25 mg by mouth daily. Was taking 50 mg but it stopped on 05-25-2023 (Patient not taking: Reported on 11/30/2023)   Facility-Administered Encounter Medications as of 11/30/2023  Medication   lanreotide acetate  (SOMATULINE DEPOT ) 120 MG/0.5ML injection   lanreotide acetate  (SOMATULINE DEPOT ) 120 MG/0.5ML injection   lanreotide acetate  (SOMATULINE DEPOT ) 120 MG/0.5ML injection   lanreotide acetate  (SOMATULINE DEPOT ) 120 MG/0.5ML injection   octreotide  (SANDOSTATIN  LAR) 30 MG IM  injection   octreotide  (SANDOSTATIN  LAR) 30 MG IM injection     SIGNIFICANT DIAGNOSTIC EXAMS       ASSESSMENT/ PLAN:     Carly Seip NP [facility]  Contact [phone] Monday through Friday 8am- 5pm  After  hours call [phone]

## 2023-12-01 DIAGNOSIS — N186 End stage renal disease: Secondary | ICD-10-CM | POA: Diagnosis not present

## 2023-12-01 DIAGNOSIS — Z992 Dependence on renal dialysis: Secondary | ICD-10-CM | POA: Diagnosis not present

## 2023-12-02 ENCOUNTER — Inpatient Hospital Stay: Payer: Self-pay

## 2023-12-02 ENCOUNTER — Encounter: Payer: Self-pay | Admitting: Oncology

## 2023-12-02 ENCOUNTER — Inpatient Hospital Stay: Payer: Self-pay | Admitting: Oncology

## 2023-12-02 VITALS — BP 163/87 | HR 89 | Temp 97.9°F | Resp 16

## 2023-12-02 DIAGNOSIS — Z7982 Long term (current) use of aspirin: Secondary | ICD-10-CM | POA: Diagnosis not present

## 2023-12-02 DIAGNOSIS — C9000 Multiple myeloma not having achieved remission: Secondary | ICD-10-CM | POA: Diagnosis not present

## 2023-12-02 DIAGNOSIS — D649 Anemia, unspecified: Secondary | ICD-10-CM | POA: Diagnosis not present

## 2023-12-02 DIAGNOSIS — Z79624 Long term (current) use of inhibitors of nucleotide synthesis: Secondary | ICD-10-CM | POA: Diagnosis not present

## 2023-12-02 DIAGNOSIS — N186 End stage renal disease: Secondary | ICD-10-CM | POA: Diagnosis not present

## 2023-12-02 DIAGNOSIS — Z79811 Long term (current) use of aromatase inhibitors: Secondary | ICD-10-CM | POA: Diagnosis not present

## 2023-12-02 DIAGNOSIS — D0510 Intraductal carcinoma in situ of unspecified breast: Secondary | ICD-10-CM | POA: Diagnosis not present

## 2023-12-02 DIAGNOSIS — Z7969 Long term (current) use of other immunomodulators and immunosuppressants: Secondary | ICD-10-CM | POA: Diagnosis not present

## 2023-12-02 DIAGNOSIS — Z5112 Encounter for antineoplastic immunotherapy: Secondary | ICD-10-CM | POA: Diagnosis not present

## 2023-12-02 LAB — CBC WITH DIFFERENTIAL/PLATELET
Abs Immature Granulocytes: 0.01 K/uL (ref 0.00–0.07)
Basophils Absolute: 0 K/uL (ref 0.0–0.1)
Basophils Relative: 1 %
Eosinophils Absolute: 0.2 K/uL (ref 0.0–0.5)
Eosinophils Relative: 4 %
HCT: 32.2 % — ABNORMAL LOW (ref 36.0–46.0)
Hemoglobin: 10.5 g/dL — ABNORMAL LOW (ref 12.0–15.0)
Immature Granulocytes: 0 %
Lymphocytes Relative: 20 %
Lymphs Abs: 0.8 K/uL (ref 0.7–4.0)
MCH: 35.2 pg — ABNORMAL HIGH (ref 26.0–34.0)
MCHC: 32.6 g/dL (ref 30.0–36.0)
MCV: 108.1 fL — ABNORMAL HIGH (ref 80.0–100.0)
Monocytes Absolute: 0.5 K/uL (ref 0.1–1.0)
Monocytes Relative: 13 %
Neutro Abs: 2.6 K/uL (ref 1.7–7.7)
Neutrophils Relative %: 62 %
Platelets: 188 K/uL (ref 150–400)
RBC: 2.98 MIL/uL — ABNORMAL LOW (ref 3.87–5.11)
RDW: 14.5 % (ref 11.5–15.5)
WBC: 4.2 K/uL (ref 4.0–10.5)
nRBC: 0 % (ref 0.0–0.2)

## 2023-12-02 LAB — COMPREHENSIVE METABOLIC PANEL WITH GFR
ALT: 13 U/L (ref 0–44)
AST: 22 U/L (ref 15–41)
Albumin: 3.8 g/dL (ref 3.5–5.0)
Alkaline Phosphatase: 79 U/L (ref 38–126)
Anion gap: 16 — ABNORMAL HIGH (ref 5–15)
BUN: 21 mg/dL (ref 8–23)
CO2: 25 mmol/L (ref 22–32)
Calcium: 9.7 mg/dL (ref 8.9–10.3)
Chloride: 95 mmol/L — ABNORMAL LOW (ref 98–111)
Creatinine, Ser: 5.49 mg/dL — ABNORMAL HIGH (ref 0.44–1.00)
GFR, Estimated: 7 mL/min — ABNORMAL LOW (ref >60)
Glucose, Bld: 159 mg/dL — ABNORMAL HIGH (ref 70–99)
Potassium: 4.3 mmol/L (ref 3.5–5.1)
Sodium: 136 mmol/L (ref 135–145)
Total Bilirubin: 0.4 mg/dL (ref 0.0–1.2)
Total Protein: 7.2 g/dL (ref 6.5–8.1)

## 2023-12-02 LAB — MAGNESIUM: Magnesium: 2.3 mg/dL (ref 1.7–2.4)

## 2023-12-02 MED ORDER — BORTEZOMIB CHEMO SQ INJECTION 3.5 MG (2.5MG/ML)
1.2000 mg/m2 | Freq: Once | INTRAMUSCULAR | Status: AC
  Administered 2023-12-02: 2 mg via SUBCUTANEOUS
  Filled 2023-12-02: qty 0.8

## 2023-12-02 MED ORDER — PROCHLORPERAZINE MALEATE 10 MG PO TABS
10.0000 mg | ORAL_TABLET | Freq: Once | ORAL | Status: AC
  Administered 2023-12-02: 10 mg via ORAL
  Filled 2023-12-02: qty 1

## 2023-12-02 NOTE — Progress Notes (Signed)
 Patient tolerated Velcade injection with no complaints voiced.  Lab work reviewed.  See MAR for details.  Injection site clean and dry with no bruising or swelling noted.  Patient stable during and after injection.  Band aid applied.  VSS.  Patient left in satisfactory condition with no s/s of distress noted.

## 2023-12-02 NOTE — Progress Notes (Signed)
 Patient presents today for Velcade  injection. Vital signs and lab work within parameters for today's treatment. Dialysis patient. Treatment guidelines disregard creatinine.

## 2023-12-02 NOTE — Progress Notes (Signed)
Patient presents today for chemotherapy infusion.  Patient is in satisfactory condition with no new complaints voiced.  Vital signs are stable.  Labs reviewed and all labs are within treatment parameters.  We will proceed with treatment per MD orders.

## 2023-12-02 NOTE — Patient Instructions (Signed)
 [facility] [place] - A DEPT OF [facility]  Discharge Instructions: Thank you for choosing [facility] to provide your oncology and hematology care.  If you have a lab appointment with the Cancer Center - please note that after April 8th, 2024, all labs will be drawn in the cancer center.  You do not have to check in or register with the main entrance as you have in the past but will complete your check-in in the cancer center.  Wear comfortable clothing and clothing appropriate for easy access to any Portacath or PICC line.   We strive to give you quality time with your provider. You may need to reschedule your appointment if you arrive late (15 or more minutes).  Arriving late affects you and other patients whose appointments are after yours.  Also, if you miss three or more appointments without notifying the office, you may be dismissed from the clinic at the provider's discretion.      For prescription refill requests, have your pharmacy contact our office and allow 72 hours for refills to be completed.    Today you received the following chemotherapy and/or immunotherapy agents Velcade, return as scheduled.   To help prevent nausea and vomiting after your treatment, we encourage you to take your nausea medication as directed.  BELOW ARE SYMPTOMS THAT SHOULD BE REPORTED IMMEDIATELY: *FEVER GREATER THAN 100.4 F (38 C) OR HIGHER *CHILLS OR SWEATING *NAUSEA AND VOMITING THAT IS NOT CONTROLLED WITH YOUR NAUSEA MEDICATION *UNUSUAL SHORTNESS OF BREATH *UNUSUAL BRUISING OR BLEEDING *URINARY PROBLEMS (pain or burning when urinating, or frequent urination) *BOWEL PROBLEMS (unusual diarrhea, constipation, pain near the anus) TENDERNESS IN MOUTH AND THROAT WITH OR WITHOUT PRESENCE OF ULCERS (sore throat, sores in mouth, or a toothache) UNUSUAL RASH, SWELLING OR PAIN  UNUSUAL VAGINAL DISCHARGE OR ITCHING   Items with * indicate a potential emergency and  should be followed up as soon as possible or go to the Emergency Department if any problems should occur.  Please show the CHEMOTHERAPY ALERT CARD or IMMUNOTHERAPY ALERT CARD at check-in to the Emergency Department and triage nurse.  Should you have questions after your visit or need to cancel or reschedule your appointment, please contact [facility] [place] - A DEPT OF [facility] [phone]  and follow the prompts.  Office hours are 8:00 a.m. to 4:30 p.m. Monday - Friday. Please note that voicemails left after 4:00 p.m. may not be returned until the following business day.  We are closed weekends and major holidays. You have access to a nurse at all times for urgent questions. Please call the main number to the clinic [phone] and follow the prompts.  For any non-urgent questions, you may also contact your provider using MyChart. We now offer e-Visits for anyone 68 and older to request care online for non-urgent symptoms. For details visit [email or web address].   Also download the MyChart app! Go to the app store, search "MyChart", open the app, select [place], and log in with your MyChart username and password.

## 2023-12-03 DIAGNOSIS — Z992 Dependence on renal dialysis: Secondary | ICD-10-CM | POA: Diagnosis not present

## 2023-12-03 DIAGNOSIS — N186 End stage renal disease: Secondary | ICD-10-CM | POA: Diagnosis not present

## 2023-12-06 ENCOUNTER — Other Ambulatory Visit (HOSPITAL_COMMUNITY)
Admission: RE | Admit: 2023-12-06 | Discharge: 2023-12-06 | Disposition: A | Discharge status: Home / Self Care | Source: Skilled Nursing Facility | Attending: Adult Health | Admitting: Adult Health

## 2023-12-06 DIAGNOSIS — N186 End stage renal disease: Secondary | ICD-10-CM | POA: Diagnosis not present

## 2023-12-06 DIAGNOSIS — E1122 Type 2 diabetes mellitus with diabetic chronic kidney disease: Secondary | ICD-10-CM | POA: Diagnosis not present

## 2023-12-06 DIAGNOSIS — Z992 Dependence on renal dialysis: Secondary | ICD-10-CM | POA: Diagnosis not present

## 2023-12-06 LAB — HEMOGLOBIN A1C
Hgb A1c MFr Bld: 5.1 % (ref 4.8–5.6)
Mean Plasma Glucose: 99.67 mg/dL

## 2023-12-08 DIAGNOSIS — N186 End stage renal disease: Secondary | ICD-10-CM | POA: Diagnosis not present

## 2023-12-08 DIAGNOSIS — Z992 Dependence on renal dialysis: Secondary | ICD-10-CM | POA: Diagnosis not present

## 2023-12-09 ENCOUNTER — Inpatient Hospital Stay: Payer: Self-pay

## 2023-12-09 ENCOUNTER — Inpatient Hospital Stay: Payer: Self-pay | Attending: Oncology

## 2023-12-09 ENCOUNTER — Encounter: Payer: Self-pay | Admitting: Oncology

## 2023-12-09 VITALS — BP 114/68 | HR 91 | Temp 96.6°F | Resp 18 | Wt 150.0 lb

## 2023-12-09 DIAGNOSIS — C9000 Multiple myeloma not having achieved remission: Secondary | ICD-10-CM | POA: Diagnosis present

## 2023-12-09 DIAGNOSIS — Z5112 Encounter for antineoplastic immunotherapy: Secondary | ICD-10-CM | POA: Insufficient documentation

## 2023-12-09 LAB — CBC WITH DIFFERENTIAL/PLATELET
Abs Immature Granulocytes: 0.08 K/uL — ABNORMAL HIGH (ref 0.00–0.07)
Basophils Absolute: 0 K/uL (ref 0.0–0.1)
Basophils Relative: 0 %
Eosinophils Absolute: 0 K/uL (ref 0.0–0.5)
Eosinophils Relative: 0 %
HCT: 30.2 % — ABNORMAL LOW (ref 36.0–46.0)
Hemoglobin: 9.9 g/dL — ABNORMAL LOW (ref 12.0–15.0)
Immature Granulocytes: 1 %
Lymphocytes Relative: 5 %
Lymphs Abs: 0.8 K/uL (ref 0.7–4.0)
MCH: 35.2 pg — ABNORMAL HIGH (ref 26.0–34.0)
MCHC: 32.8 g/dL (ref 30.0–36.0)
MCV: 107.5 fL — ABNORMAL HIGH (ref 80.0–100.0)
Monocytes Absolute: 0.8 K/uL (ref 0.1–1.0)
Monocytes Relative: 5 %
Neutro Abs: 14.3 K/uL — ABNORMAL HIGH (ref 1.7–7.7)
Neutrophils Relative %: 89 %
Platelets: 162 K/uL (ref 150–400)
RBC: 2.81 MIL/uL — ABNORMAL LOW (ref 3.87–5.11)
RDW: 14.9 % (ref 11.5–15.5)
WBC: 16 K/uL — ABNORMAL HIGH (ref 4.0–10.5)
nRBC: 0 % (ref 0.0–0.2)

## 2023-12-09 LAB — COMPREHENSIVE METABOLIC PANEL WITH GFR
ALT: 11 U/L (ref 0–44)
AST: 22 U/L (ref 15–41)
Albumin: 4.2 g/dL (ref 3.5–5.0)
Alkaline Phosphatase: 80 U/L (ref 38–126)
Anion gap: 16 — ABNORMAL HIGH (ref 5–15)
BUN: 23 mg/dL (ref 8–23)
CO2: 26 mmol/L (ref 22–32)
Calcium: 9.8 mg/dL (ref 8.9–10.3)
Chloride: 95 mmol/L — ABNORMAL LOW (ref 98–111)
Creatinine, Ser: 5.59 mg/dL — ABNORMAL HIGH (ref 0.44–1.00)
GFR, Estimated: 7 mL/min — ABNORMAL LOW (ref >60)
Glucose, Bld: 157 mg/dL — ABNORMAL HIGH (ref 70–99)
Potassium: 3.4 mmol/L — ABNORMAL LOW (ref 3.5–5.1)
Sodium: 137 mmol/L (ref 135–145)
Total Bilirubin: 0.3 mg/dL (ref 0.0–1.2)
Total Protein: 7.1 g/dL (ref 6.5–8.1)

## 2023-12-09 LAB — MAGNESIUM: Magnesium: 2.4 mg/dL (ref 1.7–2.4)

## 2023-12-09 MED ORDER — BORTEZOMIB CHEMO SQ INJECTION 3.5 MG (2.5MG/ML)
1.2000 mg/m2 | Freq: Once | INTRAMUSCULAR | Status: AC
  Administered 2023-12-09: 2 mg via SUBCUTANEOUS
  Filled 2023-12-09: qty 0.8

## 2023-12-09 MED ORDER — PROCHLORPERAZINE MALEATE 10 MG PO TABS
10.0000 mg | ORAL_TABLET | Freq: Once | ORAL | Status: AC
  Administered 2023-12-09: 10 mg via ORAL
  Filled 2023-12-09: qty 1

## 2023-12-09 NOTE — Progress Notes (Signed)
 Patient tolerated Velcade injection with no complaints voiced.  Lab work reviewed.  See MAR for details.  Injection site clean and dry with no bruising or swelling noted.  Patient stable during and after injection.  Band aid applied.  VSS.  Patient left in satisfactory condition with no s/s of distress noted.

## 2023-12-09 NOTE — Patient Instructions (Signed)
 [facility] [place] - A DEPT OF [place]  Discharge Instructions: Thank you for choosing [facility] to provide your oncology and hematology care.  If you have a lab appointment with the Cancer Center - please note that after April 8th, 2024, all labs will be drawn in the cancer center.  You do not have to check in or register with the main entrance as you have in the past but will complete your check-in in the cancer center.  Wear comfortable clothing and clothing appropriate for easy access to any Portacath or PICC line.   We strive to give you quality time with your provider. You may need to reschedule your appointment if you arrive late (15 or more minutes).  Arriving late affects you and other patients whose appointments are after yours.  Also, if you miss three or more appointments without notifying the office, you may be dismissed from the clinic at the provider's discretion.      For prescription refill requests, have your pharmacy contact our office and allow 72 hours for refills to be completed.    Today you received the following chemotherapy and/or immunotherapy agents velcade .       To help prevent nausea and vomiting after your treatment, we encourage you to take your nausea medication as directed.  BELOW ARE SYMPTOMS THAT SHOULD BE REPORTED IMMEDIATELY: *FEVER GREATER THAN 100.4 F (38 C) OR HIGHER *CHILLS OR SWEATING *NAUSEA AND VOMITING THAT IS NOT CONTROLLED WITH YOUR NAUSEA MEDICATION *UNUSUAL SHORTNESS OF BREATH *UNUSUAL BRUISING OR BLEEDING *URINARY PROBLEMS (pain or burning when urinating, or frequent urination) *BOWEL PROBLEMS (unusual diarrhea, constipation, pain near the anus) TENDERNESS IN MOUTH AND THROAT WITH OR WITHOUT PRESENCE OF ULCERS (sore throat, sores in mouth, or a toothache) UNUSUAL RASH, SWELLING OR PAIN  UNUSUAL VAGINAL DISCHARGE OR ITCHING   Items with * indicate a potential emergency and should be followed up  as soon as possible or go to the Emergency Department if any problems should occur.  Please show the CHEMOTHERAPY ALERT CARD or IMMUNOTHERAPY ALERT CARD at check-in to the Emergency Department and triage nurse.  Should you have questions after your visit or need to cancel or reschedule your appointment, please contact [facility] [place] - A DEPT OF [name] [phone]  and follow the prompts.  Office hours are 8:00 a.m. to 4:30 p.m. Monday - Friday. Please note that voicemails left after 4:00 p.m. may not be returned until the following business day.  We are closed weekends and major holidays. You have access to a nurse at all times for urgent questions. Please call the main number to the clinic [phone] and follow the prompts.  For any non-urgent questions, you may also contact your provider using MyChart. We now offer e-Visits for anyone 8 and older to request care online for non-urgent symptoms. For details visit [email or web address].   Also download the MyChart app! Go to the app store, search MyChart, open the app, select [place], and log in with your MyChart username and password.

## 2023-12-10 DIAGNOSIS — Z992 Dependence on renal dialysis: Secondary | ICD-10-CM | POA: Diagnosis not present

## 2023-12-10 LAB — KAPPA/LAMBDA LIGHT CHAINS
Kappa free light chain: 160.1 mg/L — ABNORMAL HIGH (ref 3.3–19.4)
Kappa, lambda light chain ratio: 0.29 (ref 0.26–1.65)
Lambda free light chains: 551.9 mg/L — ABNORMAL HIGH (ref 5.7–26.3)

## 2023-12-13 DIAGNOSIS — E1122 Type 2 diabetes mellitus with diabetic chronic kidney disease: Secondary | ICD-10-CM | POA: Diagnosis not present

## 2023-12-13 DIAGNOSIS — E43 Unspecified severe protein-calorie malnutrition: Secondary | ICD-10-CM | POA: Diagnosis not present

## 2023-12-13 DIAGNOSIS — A419 Sepsis, unspecified organism: Secondary | ICD-10-CM | POA: Diagnosis not present

## 2023-12-13 DIAGNOSIS — F01518 Vascular dementia, unspecified severity, with other behavioral disturbance: Secondary | ICD-10-CM | POA: Diagnosis not present

## 2023-12-13 DIAGNOSIS — R1312 Dysphagia, oropharyngeal phase: Secondary | ICD-10-CM | POA: Diagnosis not present

## 2023-12-13 LAB — PROTEIN ELECTROPHORESIS, SERUM
A/G Ratio: 1.3 (ref 0.7–1.7)
Albumin ELP: 3.7 g/dL (ref 2.9–4.4)
Alpha-1-Globulin: 0.3 g/dL (ref 0.0–0.4)
Alpha-2-Globulin: 0.8 g/dL (ref 0.4–1.0)
Beta Globulin: 1 g/dL (ref 0.7–1.3)
Gamma Globulin: 0.8 g/dL (ref 0.4–1.8)
Globulin, Total: 2.9 g/dL (ref 2.2–3.9)
M-Spike, %: 0.4 g/dL — ABNORMAL HIGH
Total Protein ELP: 6.6 g/dL (ref 6.0–8.5)

## 2023-12-14 DIAGNOSIS — E1122 Type 2 diabetes mellitus with diabetic chronic kidney disease: Secondary | ICD-10-CM | POA: Diagnosis not present

## 2023-12-14 DIAGNOSIS — F01518 Vascular dementia, unspecified severity, with other behavioral disturbance: Secondary | ICD-10-CM | POA: Diagnosis not present

## 2023-12-14 DIAGNOSIS — R1312 Dysphagia, oropharyngeal phase: Secondary | ICD-10-CM | POA: Diagnosis not present

## 2023-12-14 DIAGNOSIS — A419 Sepsis, unspecified organism: Secondary | ICD-10-CM | POA: Diagnosis not present

## 2023-12-14 DIAGNOSIS — E43 Unspecified severe protein-calorie malnutrition: Secondary | ICD-10-CM | POA: Diagnosis not present

## 2023-12-15 DIAGNOSIS — F01518 Vascular dementia, unspecified severity, with other behavioral disturbance: Secondary | ICD-10-CM | POA: Diagnosis not present

## 2023-12-15 DIAGNOSIS — A419 Sepsis, unspecified organism: Secondary | ICD-10-CM | POA: Diagnosis not present

## 2023-12-15 DIAGNOSIS — Z992 Dependence on renal dialysis: Secondary | ICD-10-CM | POA: Diagnosis not present

## 2023-12-15 DIAGNOSIS — E43 Unspecified severe protein-calorie malnutrition: Secondary | ICD-10-CM | POA: Diagnosis not present

## 2023-12-15 DIAGNOSIS — R1312 Dysphagia, oropharyngeal phase: Secondary | ICD-10-CM | POA: Diagnosis not present

## 2023-12-15 DIAGNOSIS — E1122 Type 2 diabetes mellitus with diabetic chronic kidney disease: Secondary | ICD-10-CM | POA: Diagnosis not present

## 2023-12-16 ENCOUNTER — Inpatient Hospital Stay: Payer: Self-pay

## 2023-12-16 ENCOUNTER — Other Ambulatory Visit: Payer: Self-pay | Admitting: Oncology

## 2023-12-16 VITALS — BP 144/68 | HR 83 | Temp 98.1°F | Resp 18

## 2023-12-16 DIAGNOSIS — R1312 Dysphagia, oropharyngeal phase: Secondary | ICD-10-CM | POA: Diagnosis not present

## 2023-12-16 DIAGNOSIS — E43 Unspecified severe protein-calorie malnutrition: Secondary | ICD-10-CM | POA: Diagnosis not present

## 2023-12-16 DIAGNOSIS — C9000 Multiple myeloma not having achieved remission: Secondary | ICD-10-CM

## 2023-12-16 DIAGNOSIS — A419 Sepsis, unspecified organism: Secondary | ICD-10-CM | POA: Diagnosis not present

## 2023-12-16 DIAGNOSIS — Z5112 Encounter for antineoplastic immunotherapy: Secondary | ICD-10-CM | POA: Diagnosis not present

## 2023-12-16 DIAGNOSIS — F01518 Vascular dementia, unspecified severity, with other behavioral disturbance: Secondary | ICD-10-CM | POA: Diagnosis not present

## 2023-12-16 DIAGNOSIS — E1122 Type 2 diabetes mellitus with diabetic chronic kidney disease: Secondary | ICD-10-CM | POA: Diagnosis not present

## 2023-12-16 LAB — CBC WITH DIFFERENTIAL/PLATELET
Abs Immature Granulocytes: 0.04 K/uL (ref 0.00–0.07)
Basophils Absolute: 0 K/uL (ref 0.0–0.1)
Basophils Relative: 0 %
Eosinophils Absolute: 0.2 K/uL (ref 0.0–0.5)
Eosinophils Relative: 3 %
HCT: 33.1 % — ABNORMAL LOW (ref 36.0–46.0)
Hemoglobin: 10.6 g/dL — ABNORMAL LOW (ref 12.0–15.0)
Immature Granulocytes: 1 %
Lymphocytes Relative: 12 %
Lymphs Abs: 0.9 K/uL (ref 0.7–4.0)
MCH: 34.5 pg — ABNORMAL HIGH (ref 26.0–34.0)
MCHC: 32 g/dL (ref 30.0–36.0)
MCV: 107.8 fL — ABNORMAL HIGH (ref 80.0–100.0)
Monocytes Absolute: 1 K/uL (ref 0.1–1.0)
Monocytes Relative: 13 %
Neutro Abs: 5.5 K/uL (ref 1.7–7.7)
Neutrophils Relative %: 71 %
Platelets: 209 K/uL (ref 150–400)
RBC: 3.07 MIL/uL — ABNORMAL LOW (ref 3.87–5.11)
RDW: 14.3 % (ref 11.5–15.5)
WBC: 7.6 K/uL (ref 4.0–10.5)
nRBC: 0 % (ref 0.0–0.2)

## 2023-12-16 LAB — COMPREHENSIVE METABOLIC PANEL WITH GFR
ALT: 13 U/L (ref 0–44)
AST: 24 U/L (ref 15–41)
Albumin: 4.3 g/dL (ref 3.5–5.0)
Alkaline Phosphatase: 85 U/L (ref 38–126)
Anion gap: 16 — ABNORMAL HIGH (ref 5–15)
BUN: 18 mg/dL (ref 8–23)
CO2: 28 mmol/L (ref 22–32)
Calcium: 10.2 mg/dL (ref 8.9–10.3)
Chloride: 97 mmol/L — ABNORMAL LOW (ref 98–111)
Creatinine, Ser: 5.45 mg/dL — ABNORMAL HIGH (ref 0.44–1.00)
GFR, Estimated: 7 mL/min — ABNORMAL LOW (ref >60)
Glucose, Bld: 144 mg/dL — ABNORMAL HIGH (ref 70–99)
Potassium: 3.7 mmol/L (ref 3.5–5.1)
Sodium: 141 mmol/L (ref 135–145)
Total Bilirubin: 0.3 mg/dL (ref 0.0–1.2)
Total Protein: 7.5 g/dL (ref 6.5–8.1)

## 2023-12-16 LAB — MAGNESIUM: Magnesium: 2.5 mg/dL — ABNORMAL HIGH (ref 1.7–2.4)

## 2023-12-16 MED ORDER — PROCHLORPERAZINE MALEATE 10 MG PO TABS
10.0000 mg | ORAL_TABLET | Freq: Once | ORAL | Status: AC
  Administered 2023-12-16: 10 mg via ORAL
  Filled 2023-12-16: qty 1

## 2023-12-16 MED ORDER — BORTEZOMIB CHEMO SQ INJECTION 3.5 MG (2.5MG/ML)
1.2000 mg/m2 | Freq: Once | INTRAMUSCULAR | Status: AC
  Administered 2023-12-16: 2 mg via SUBCUTANEOUS
  Filled 2023-12-16: qty 0.8

## 2023-12-16 NOTE — Progress Notes (Signed)
 Labs reviewed, ok to treat  Treatment given per orders. Patient tolerated it well without problems. Vitals stable and discharged to [facility] from clinic ambulatory. Follow up as scheduled.

## 2023-12-16 NOTE — Patient Instructions (Signed)
 [facility] [place] - A DEPT OF [place]  Discharge Instructions: Thank you for choosing [facility] to provide your oncology and hematology care.  If you have a lab appointment with the Cancer Center - please note that after April 8th, 2024, all labs will be drawn in the cancer center.  You do not have to check in or register with the main entrance as you have in the past but will complete your check-in in the cancer center.  Wear comfortable clothing and clothing appropriate for easy access to any Portacath or PICC line.   We strive to give you quality time with your provider. You may need to reschedule your appointment if you arrive late (15 or more minutes).  Arriving late affects you and other patients whose appointments are after yours.  Also, if you miss three or more appointments without notifying the office, you may be dismissed from the clinic at the provider's discretion.      For prescription refill requests, have your pharmacy contact our office and allow 72 hours for refills to be completed.    Today you received the following chemotherapy and/or immunotherapy agents Borteziomib   To help prevent nausea and vomiting after your treatment, we encourage you to take your nausea medication as directed.  BELOW ARE SYMPTOMS THAT SHOULD BE REPORTED IMMEDIATELY: *FEVER GREATER THAN 100.4 F (38 C) OR HIGHER *CHILLS OR SWEATING *NAUSEA AND VOMITING THAT IS NOT CONTROLLED WITH YOUR NAUSEA MEDICATION *UNUSUAL SHORTNESS OF BREATH *UNUSUAL BRUISING OR BLEEDING *URINARY PROBLEMS (pain or burning when urinating, or frequent urination) *BOWEL PROBLEMS (unusual diarrhea, constipation, pain near the anus) TENDERNESS IN MOUTH AND THROAT WITH OR WITHOUT PRESENCE OF ULCERS (sore throat, sores in mouth, or a toothache) UNUSUAL RASH, SWELLING OR PAIN  UNUSUAL VAGINAL DISCHARGE OR ITCHING   Items with * indicate a potential emergency and should be followed up  as soon as possible or go to the Emergency Department if any problems should occur.  Please show the CHEMOTHERAPY ALERT CARD or IMMUNOTHERAPY ALERT CARD at check-in to the Emergency Department and triage nurse.  Should you have questions after your visit or need to cancel or reschedule your appointment, please contact [facility] [place] - A DEPT OF [name] [phone]  and follow the prompts.  Office hours are 8:00 a.m. to 4:30 p.m. Monday - Friday. Please note that voicemails left after 4:00 p.m. may not be returned until the following business day.  We are closed weekends and major holidays. You have access to a nurse at all times for urgent questions. Please call the main number to the clinic [phone] and follow the prompts.  For any non-urgent questions, you may also contact your provider using MyChart. We now offer e-Visits for anyone 32 and older to request care online for non-urgent symptoms. For details visit [email or web address].   Also download the MyChart app! Go to the app store, search MyChart, open the app, select [place], and log in with your MyChart username and password.

## 2023-12-17 ENCOUNTER — Non-Acute Institutional Stay (SKILLED_NURSING_FACILITY): Payer: Self-pay | Admitting: Adult Health

## 2023-12-17 ENCOUNTER — Encounter: Payer: Self-pay | Admitting: Adult Health

## 2023-12-17 DIAGNOSIS — I120 Hypertensive chronic kidney disease with stage 5 chronic kidney disease or end stage renal disease: Secondary | ICD-10-CM

## 2023-12-17 DIAGNOSIS — E43 Unspecified severe protein-calorie malnutrition: Secondary | ICD-10-CM | POA: Diagnosis not present

## 2023-12-17 DIAGNOSIS — F01518 Vascular dementia, unspecified severity, with other behavioral disturbance: Secondary | ICD-10-CM | POA: Diagnosis not present

## 2023-12-17 DIAGNOSIS — R1312 Dysphagia, oropharyngeal phase: Secondary | ICD-10-CM | POA: Diagnosis not present

## 2023-12-17 DIAGNOSIS — Z992 Dependence on renal dialysis: Secondary | ICD-10-CM

## 2023-12-17 DIAGNOSIS — R1310 Dysphagia, unspecified: Secondary | ICD-10-CM

## 2023-12-17 DIAGNOSIS — N186 End stage renal disease: Secondary | ICD-10-CM

## 2023-12-17 DIAGNOSIS — E1122 Type 2 diabetes mellitus with diabetic chronic kidney disease: Secondary | ICD-10-CM

## 2023-12-17 NOTE — Progress Notes (Signed)
 Location:  [facility] Room Number: 97 W Place of Service:  SNF (31)   CODE STATUS: DNR  Allergies  Allergen Reactions   Ace Inhibitors Other (See Comments) and Cough    Tongue swelling Angioedema    Angiotensin Receptor Blockers Other (See Comments)    Angioedema with ACE-I   Penicillins Other (See Comments)    Unknown reaction    Chief Complaint  Patient presents with   Care Plan Meeting    HPI:  We have come together for her care plan meeting. BIMS 13/15 mood 5/30: not sleeping well; nervous at times. She uses a wheelchair without falls. She requires moderate to dependent assist with her adl care. She is frequently incontinent of bladder and bowel. Dietary: on D3 thin liquids appetite 26-75%; setup for meals weight is 150 pounds. Therapy: ST for swallowing will need a modified barium swallow. OT for self feeding. Activities: does socialize. She will continue to be followed for her chronic illnesses including: Type 2 diabetes mellitus with hypertension and end stage renal disease on dialysis    Dysphagia unspecified type  Vascular dementia with behavioral disturbance  Past Medical History:  Diagnosis Date   Anemia     chronic macrocytic anemia   Anxiety    Chronic kidney disease    Chronic renal disease, stage 4, severely decreased glomerular filtration rate (GFR) between 15-29 mL/min/1.73 square meter (HCC) 08/22/2015   Complication of anesthesia    delirious after Breast Surgery   Dementia (HCC)    mild   Depression    Diabetes mellitus with ESRD (end-stage renal disease) (HCC)    type II   Dysphagia    Dyspnea    with activity   GERD (gastroesophageal reflux disease)    Glaucoma    Hyperlipidemia    Hypertension    Multiple myeloma (HCC)    Pneumonia    Stage 1 infiltrating ductal carcinoma of right female breast (HCC) 08/21/2015   ER+ PR+ HER 2 neu + (3+) T1cN0     Past Surgical History:  Procedure Laterality Date   A/V FISTULAGRAM  Right 07/05/2020   Procedure: A/V Fistulagram;  Surgeon: Harvey Carlin BRAVO, MD;  [facility]: [facility];  Service: Cardiovascular;  Laterality: Right;   AV FISTULA PLACEMENT Left 11/22/2017   Procedure: ARTERIOVENOUS (AV) FISTULA CREATION LEFT ARM;  Surgeon: Harvey Carlin BRAVO, MD;  Location: [facility] OR;  Service: Vascular;  Laterality: Left;   AV FISTULA PLACEMENT Right 04/04/2020   Procedure: RIGHT ARM ARTERIOVENOUS FISTULA CREATION;  Surgeon: Oris Krystal FALCON, MD;  Location: [facility];  Service: Vascular;  Laterality: Right;   AV FISTULA PLACEMENT Right 08/20/2020   Procedure: ARTERIOVENOUS (AV) FISTULA LIGATION RIGHT ARM;  Surgeon: Harvey Carlin BRAVO, MD;  Location: [facility] OR;  Service: Vascular;  Laterality: Right;   BIOPSY  08/07/2016   Procedure: BIOPSY;  Surgeon: Shaaron [name], MD;  Location: [facility];  Service: Endoscopy;;  gastric ulcer biopsy   COLONOSCOPY     ESOPHAGOGASTRODUODENOSCOPY N/A 08/07/2016   LA Grade A esophagitis s/p dilation, small hiatal hernia, multiple gastric ulcers and erosions, duodenal erosions s/p biopsy. Negative H.pylori    ESOPHAGOGASTRODUODENOSCOPY N/A 11/27/2016   normal esophagus, previously noted gastric ulcers completely healed, normal duodenum.    ESOPHAGOGASTRODUODENOSCOPY (EGD) WITH PROPOFOL  N/A 07/23/2020   Procedure: ESOPHAGOGASTRODUODENOSCOPY (EGD) WITH PROPOFOL ;  Surgeon: Golda Claudis PENNER, MD;  [facility]: [facility];  Service: Endoscopy;  Laterality: N/A;   FISTULA SUPERFICIALIZATION Left 02/14/2018  Procedure: FISTULA SUPERFICIALIZATION LEFT ARM;  Surgeon: Eliza Lonni RAMAN, MD;  Location: [facility] OR;  Service: Vascular;  Laterality: Left;   FRACTURE SURGERY Right    ankle   HOT HEMOSTASIS  07/23/2020   Procedure: HOT HEMOSTASIS (ARGON PLASMA COAGULATION/BICAP);  Surgeon: Golda Claudis PENNER, MD;  [facility]: [facility];  Service: Endoscopy;;   INTRAMEDULLARY (IM) NAIL INTERTROCHANTERIC Right 07/12/2020   Procedure: INTRAMEDULLARY (IM) NAIL  INTERTROCHANTRIC;  Surgeon: Onesimo Oneil LABOR, MD;  Location: [facility];  Service: Orthopedics;  Laterality: Right;   IR FLUORO GUIDE CV LINE LEFT  12/21/2022   IR US  GUIDE VASC ACCESS LEFT  12/21/2022   MALONEY DILATION N/A 08/07/2016   Procedure: AGAPITO DILATION;  Surgeon: Shaaron [name], MD;  [facility];  Service: Endoscopy;  Laterality: N/A;   MASTECTOMY, PARTIAL Right    PERIPHERAL VASCULAR BALLOON ANGIOPLASTY Left 07/13/2019   Procedure: PERIPHERAL VASCULAR BALLOON ANGIOPLASTY;  Surgeon: Gretta Lonni PARAS, MD;  [facility]: [facility];  Service: Cardiovascular;  Laterality: Left;  arm fistulogram   PERIPHERAL VASCULAR BALLOON ANGIOPLASTY Right 05/22/2020   Procedure: PERIPHERAL VASCULAR BALLOON ANGIOPLASTY;  Surgeon: Magda Debby SAILOR, MD;  Location: [facility];  Service: Cardiovascular;  Laterality: Right;  arm fistula   PERIPHERAL VASCULAR BALLOON ANGIOPLASTY Right 07/05/2020   Procedure: PERIPHERAL VASCULAR BALLOON ANGIOPLASTY;  Surgeon: Harvey Carlin BRAVO, MD;  Location: [facility];  Service: Cardiovascular;  Laterality: Right;  arm fistula   PORT-A-CATH REMOVAL Left 11/22/2017   Procedure: REMOVAL PORT-A-CATH LEFT CHEST;  Surgeon: Harvey Carlin BRAVO, MD;  Location: [facility] OR;  Service: Vascular;  Laterality: Left;   RETINAL DETACHMENT SURGERY Right    SCLEROTHERAPY  07/23/2020   Procedure: SCLEROTHERAPY;  Surgeon: Golda Claudis PENNER, MD;  [facility];  Service: Endoscopy;;   TUNNELLED CATHETER EXCHANGE N/A 08/16/2023   Procedure: TUNNELLED CATHETER EXCHANGE;  Surgeon: Magda Debby SAILOR, MD;  Location: [facility];  Service: Cardiovascular;  Laterality: N/A;   UPPER EXTREMITY VENOGRAPHY N/A 08/22/2020   Procedure: LEFT UPPER & CENTRAL VENOGRAPHY;  Surgeon: Gretta Lonni PARAS, MD;  [facility]: [facility];  Service: Cardiovascular;  Laterality: N/A;    Social History   Socioeconomic History   Marital status: Single    Spouse name: Not on file    Number of children: Not on file   Years of education: Not on file   Highest education level: Not on file  Occupational History   Occupation: retired   Tobacco Use   Smoking status: Never   Smokeless tobacco: Never  Vaping Use   Vaping status: Never Used  Substance and Sexual Activity   Alcohol  use: No    Alcohol /week: 0.0 standard drinks of alcohol    Drug use: No   Sexual activity: Never  Other Topics Concern   Not on file  Social History Narrative   Long term resident of SNF    Social Drivers of Health   Financial Resource Strain: Low Risk  (01/09/2020)   Overall Financial Resource Strain (CARDIA)    Difficulty of Paying Living Expenses: Not very hard  Food Insecurity: Patient Unable To Answer (05/24/2023)   Hunger Vital Sign    Worried About Running Out of Food in the Last Year: Patient unable to answer    Ran Out of Food in the Last Year: Patient unable to answer  Transportation Needs: Patient Unable To Answer (05/24/2023)   PRAPARE - Administrator, Civil Service (Medical): Patient  unable to answer    Lack of Transportation (Non-Medical): Patient unable to answer  Physical Activity: Inactive (01/09/2020)   Exercise Vital Sign    Days of Exercise per Week: 0 days    Minutes of Exercise per Session: 0 min  Stress: No Stress Concern Present (01/09/2020)   [employer] of Occupational Health - Occupational Stress Questionnaire    Feeling of Stress : Not at all  Social Connections: Patient Unable To Answer (05/24/2023)   Social Connection and Isolation Panel    Frequency of Communication with Friends and Family: Patient unable to answer    Frequency of Social Gatherings with Friends and Family: Patient unable to answer    Attends Religious Services: Patient unable to answer    Active Member of Clubs or Organizations: Patient unable to answer    Attends Banker Meetings: Patient unable to answer    Marital Status: Patient unable to answer   Intimate Partner Violence: Patient Unable To Answer (05/24/2023)   Humiliation, Afraid, Rape, and Kick questionnaire    Fear of Current or Ex-Partner: Patient unable to answer    Emotionally Abused: Patient unable to answer    Physically Abused: Patient unable to answer    Sexually Abused: Patient unable to answer   Family History  Problem Relation Age of Onset   Multiple myeloma Sister    Brain cancer Sister    Dementia Mother        died at 18   Stroke Mother    Heart failure Mother    Diabetes Mother    Heart disease Father    Prostate cancer Brother    Colon cancer Neg Hx       VITAL SIGNS BP 136/88   Pulse 95   Temp 97.9 F (36.6 C)   Resp 19   Ht 5' 3 (1.6 m)   Wt 150 lb (68 kg)   SpO2 98%   BMI 26.57 kg/m   Outpatient Encounter Medications as of 12/17/2023  Medication Sig   acetaminophen  (TYLENOL ) 325 MG tablet Take 650 mg by mouth 3 (three) times daily.   acyclovir  (ZOVIRAX ) 200 MG capsule Take 200 mg by mouth in the morning. (0800)   anastrozole  (ARIMIDEX ) 1 MG tablet TAKE 1 TABLET BY MOUTH DAILY   aspirin  EC 81 MG tablet Take 1 tablet (81 mg total) by mouth daily. Swallow whole.   atorvastatin  (LIPITOR) 40 MG tablet Take 1 tablet (40 mg total) by mouth daily.   Calcium  Carb-Cholecalciferol  (CALCIUM  + VITAMIN D3) 500-5 MG-MCG TABS Take 1 tablet by mouth daily.   melatonin 5 MG TABS Take 5 mg by mouth at bedtime.   multivitamin (RENA-VIT) TABS tablet Take 1 tablet by mouth at bedtime.   Nutritional Supplements (ENSURE ENLIVE PO) Take 237 mLs by mouth every evening.   ondansetron  (ZOFRAN -ODT) 4 MG disintegrating tablet Take 4 mg by mouth daily before breakfast.   pantoprazole  (PROTONIX ) 40 MG tablet Take 40 mg by mouth 2 (two) times daily.   sennosides-docusate sodium (SENOKOT-S) 8.6-50 MG tablet Take 1 tablet by mouth 2 (two) times daily.   sevelamer  carbonate (RENVELA ) 800 MG tablet Take 800 mg by mouth daily. (Patient taking differently: Take 800 mg by  mouth 3 (three) times daily with meals.)   sucralfate  (CARAFATE ) 1 GM/10ML suspension Take 10 mLs by mouth 2 (two) times daily.   sertraline  (ZOLOFT ) 25 MG tablet Take 25 mg by mouth daily. Was taking 50 mg but it stopped on 05-25-2023 (Patient not  taking: Reported on 12/17/2023)   Facility-Administered Encounter Medications as of 12/17/2023  Medication   lanreotide acetate  (SOMATULINE DEPOT ) 120 MG/0.5ML injection   lanreotide acetate  (SOMATULINE DEPOT ) 120 MG/0.5ML injection   lanreotide acetate  (SOMATULINE DEPOT ) 120 MG/0.5ML injection   lanreotide acetate  (SOMATULINE DEPOT ) 120 MG/0.5ML injection   octreotide  (SANDOSTATIN  LAR) 30 MG IM injection   octreotide  (SANDOSTATIN  LAR) 30 MG IM injection     SIGNIFICANT DIAGNOSTIC EXAMS  LABS REVIEWED PREVIOUS      12-16-22: wbc 6.7; hgb 12.4; hct 38.3; mcv 101.3 plt 201; glucose 123; bun 22; creat 4.81; k+ 4.1; na++ 133; ca 9.4; gfr 8; protein 7.9; albumin  4.1; blood culture: staphylococcus hominis  12-22-22: wbc 4.2; hgb 9.8; hct 30.1; mcv 100.0; plt 184; glucose 90; bun 81; creat 12.02; k+ 5.0; na++ 136; ca 8.8; gfr 3; phos 10.8; albumin  3.2 02-16-23: wbc 4.9; hgb 9.2; hct 28.8; mcv 104.7 plt 215 glucose 194; bun 32; creat 8.17; k+ 5.1; na++ 9.8;gfr 4; protein 7.5 albumin  3.9 mag 2.6   05-27-23: uric acid 3.4 07-01-23: wbc 4.1; hgb 11.2; hct 35.2; mcv 101.4 plt 197;glucose 145; bun 22; creat 6.07; k+ 3.8; na++ 135; ca 9.4 gfr 6 protein 7.2 albumin  3.6 mag 2.4  08-26-23: wbc 4.0; hgb 10.5; hct 32.8; mcv 103.5 plt 197; glucose 116; bun 24; creat 5.78; k+ 4.2; na++ 135; ca 9.5 gfr 7; protein 7.2 albumin  3.6 mag 2.3   TODAY  11-11-23: wbc 3.9; hgb 9.8; hct 30.7; mcv 108.5 plt 166; glucose 170; bun 22; creat 5.47; k+ 4.0; na++ 136; ca 9.4 gfr 7 protein 7.0 albumin  3.6; mag 2.3   Review of Systems  Constitutional:  Negative for malaise/fatigue.  Respiratory:  Negative for cough and shortness of breath.   Cardiovascular:  Negative for chest pain,  palpitations and leg swelling.  Gastrointestinal:  Negative for abdominal pain, constipation and heartburn.  Musculoskeletal:  Negative for back pain, joint pain and myalgias.  Skin: Negative.   Neurological:  Negative for dizziness.  Psychiatric/Behavioral:  The patient is not nervous/anxious.    Physical Exam Constitutional:      General: She is not in acute distress.    Appearance: She is well-developed. She is not diaphoretic.  Neck:     Thyroid : No thyromegaly.  Cardiovascular:     Rate and Rhythm: Normal rate and regular rhythm.     Heart sounds: Normal heart sounds.  Pulmonary:     Effort: Pulmonary effort is normal. No respiratory distress.     Breath sounds: Normal breath sounds.  Abdominal:     General: Bowel sounds are normal. There is no distension.     Palpations: Abdomen is soft.     Tenderness: There is no abdominal tenderness.  Musculoskeletal:        General: Normal range of motion.     Cervical back: Neck supple.     Right lower leg: No edema.     Left lower leg: No edema.  Lymphadenopathy:     Cervical: No cervical adenopathy.  Skin:    General: Skin is warm and dry.     Comments: Tunneled dialysis access   chest wall        Neurological:     Mental Status: She is alert. Mental status is at baseline.  Psychiatric:        Mood and Affect: Mood normal.        ASSESSMENT/ PLAN:  TODAY  Type 2 diabetes mellitus with hypertension and end stage renal disease  on dialysis Dysphagia unspecified type Vascular dementia with behavioral disturbance  Will continue current medications Will continue current plan of care Will continue to monitor her status   Time spent with patient: 40 minutes: medications; dietary; therapy.    Barnie Seip NP [facility]  call [phone]

## 2023-12-20 DIAGNOSIS — A419 Sepsis, unspecified organism: Secondary | ICD-10-CM | POA: Diagnosis not present

## 2023-12-20 DIAGNOSIS — R1312 Dysphagia, oropharyngeal phase: Secondary | ICD-10-CM | POA: Diagnosis not present

## 2023-12-20 DIAGNOSIS — Z992 Dependence on renal dialysis: Secondary | ICD-10-CM | POA: Diagnosis not present

## 2023-12-20 DIAGNOSIS — N186 End stage renal disease: Secondary | ICD-10-CM | POA: Diagnosis not present

## 2023-12-20 DIAGNOSIS — E43 Unspecified severe protein-calorie malnutrition: Secondary | ICD-10-CM | POA: Diagnosis not present

## 2023-12-20 DIAGNOSIS — E1122 Type 2 diabetes mellitus with diabetic chronic kidney disease: Secondary | ICD-10-CM | POA: Diagnosis not present

## 2023-12-20 DIAGNOSIS — F01518 Vascular dementia, unspecified severity, with other behavioral disturbance: Secondary | ICD-10-CM | POA: Diagnosis not present

## 2023-12-21 DIAGNOSIS — E43 Unspecified severe protein-calorie malnutrition: Secondary | ICD-10-CM | POA: Diagnosis not present

## 2023-12-21 DIAGNOSIS — E1122 Type 2 diabetes mellitus with diabetic chronic kidney disease: Secondary | ICD-10-CM | POA: Diagnosis not present

## 2023-12-21 DIAGNOSIS — R1312 Dysphagia, oropharyngeal phase: Secondary | ICD-10-CM | POA: Diagnosis not present

## 2023-12-21 DIAGNOSIS — A419 Sepsis, unspecified organism: Secondary | ICD-10-CM | POA: Diagnosis not present

## 2023-12-21 DIAGNOSIS — F01518 Vascular dementia, unspecified severity, with other behavioral disturbance: Secondary | ICD-10-CM | POA: Diagnosis not present

## 2023-12-22 DIAGNOSIS — E43 Unspecified severe protein-calorie malnutrition: Secondary | ICD-10-CM | POA: Diagnosis not present

## 2023-12-22 DIAGNOSIS — R1312 Dysphagia, oropharyngeal phase: Secondary | ICD-10-CM | POA: Diagnosis not present

## 2023-12-22 DIAGNOSIS — F01518 Vascular dementia, unspecified severity, with other behavioral disturbance: Secondary | ICD-10-CM | POA: Diagnosis not present

## 2023-12-22 DIAGNOSIS — Z992 Dependence on renal dialysis: Secondary | ICD-10-CM | POA: Diagnosis not present

## 2023-12-22 DIAGNOSIS — A419 Sepsis, unspecified organism: Secondary | ICD-10-CM | POA: Diagnosis not present

## 2023-12-22 DIAGNOSIS — E1122 Type 2 diabetes mellitus with diabetic chronic kidney disease: Secondary | ICD-10-CM | POA: Diagnosis not present

## 2023-12-23 ENCOUNTER — Encounter: Payer: Self-pay | Admitting: *Deleted

## 2023-12-23 ENCOUNTER — Inpatient Hospital Stay: Payer: Self-pay

## 2023-12-23 ENCOUNTER — Non-Acute Institutional Stay (SKILLED_NURSING_FACILITY): Payer: Self-pay | Admitting: Adult Health

## 2023-12-23 ENCOUNTER — Inpatient Hospital Stay (HOSPITAL_COMMUNITY)

## 2023-12-23 ENCOUNTER — Other Ambulatory Visit: Payer: Self-pay

## 2023-12-23 ENCOUNTER — Encounter: Payer: Self-pay | Admitting: Adult Health

## 2023-12-23 ENCOUNTER — Other Ambulatory Visit: Payer: Self-pay | Admitting: Oncology

## 2023-12-23 ENCOUNTER — Emergency Department (HOSPITAL_COMMUNITY)

## 2023-12-23 ENCOUNTER — Inpatient Hospital Stay (HOSPITAL_COMMUNITY)
Admission: EM | Admit: 2023-12-23 | Discharge: 2023-12-27 | DRG: 189 | Disposition: A | Source: Skilled Nursing Facility | Attending: Family Medicine | Admitting: Family Medicine

## 2023-12-23 DIAGNOSIS — Z170 Estrogen receptor positive status [ER+]: Secondary | ICD-10-CM

## 2023-12-23 DIAGNOSIS — I517 Cardiomegaly: Secondary | ICD-10-CM | POA: Diagnosis not present

## 2023-12-23 DIAGNOSIS — J069 Acute upper respiratory infection, unspecified: Secondary | ICD-10-CM | POA: Diagnosis not present

## 2023-12-23 DIAGNOSIS — Z66 Do not resuscitate: Secondary | ICD-10-CM | POA: Diagnosis present

## 2023-12-23 DIAGNOSIS — F0150 Vascular dementia without behavioral disturbance: Secondary | ICD-10-CM | POA: Diagnosis present

## 2023-12-23 DIAGNOSIS — E785 Hyperlipidemia, unspecified: Secondary | ICD-10-CM | POA: Diagnosis present

## 2023-12-23 DIAGNOSIS — H409 Unspecified glaucoma: Secondary | ICD-10-CM | POA: Diagnosis present

## 2023-12-23 DIAGNOSIS — Z8249 Family history of ischemic heart disease and other diseases of the circulatory system: Secondary | ICD-10-CM

## 2023-12-23 DIAGNOSIS — A419 Sepsis, unspecified organism: Secondary | ICD-10-CM | POA: Diagnosis not present

## 2023-12-23 DIAGNOSIS — I350 Nonrheumatic aortic (valve) stenosis: Secondary | ICD-10-CM | POA: Diagnosis present

## 2023-12-23 DIAGNOSIS — G9341 Metabolic encephalopathy: Secondary | ICD-10-CM | POA: Diagnosis present

## 2023-12-23 DIAGNOSIS — Z880 Allergy status to penicillin: Secondary | ICD-10-CM

## 2023-12-23 DIAGNOSIS — R509 Fever, unspecified: Secondary | ICD-10-CM | POA: Diagnosis not present

## 2023-12-23 DIAGNOSIS — Z823 Family history of stroke: Secondary | ICD-10-CM

## 2023-12-23 DIAGNOSIS — R0609 Other forms of dyspnea: Secondary | ICD-10-CM | POA: Diagnosis not present

## 2023-12-23 DIAGNOSIS — Z992 Dependence on renal dialysis: Secondary | ICD-10-CM | POA: Diagnosis not present

## 2023-12-23 DIAGNOSIS — Z515 Encounter for palliative care: Secondary | ICD-10-CM

## 2023-12-23 DIAGNOSIS — R059 Cough, unspecified: Secondary | ICD-10-CM | POA: Diagnosis not present

## 2023-12-23 DIAGNOSIS — K219 Gastro-esophageal reflux disease without esophagitis: Secondary | ICD-10-CM | POA: Diagnosis present

## 2023-12-23 DIAGNOSIS — I120 Hypertensive chronic kidney disease with stage 5 chronic kidney disease or end stage renal disease: Secondary | ICD-10-CM | POA: Diagnosis not present

## 2023-12-23 DIAGNOSIS — Z888 Allergy status to other drugs, medicaments and biological substances status: Secondary | ICD-10-CM

## 2023-12-23 DIAGNOSIS — D631 Anemia in chronic kidney disease: Secondary | ICD-10-CM | POA: Diagnosis not present

## 2023-12-23 DIAGNOSIS — C9000 Multiple myeloma not having achieved remission: Secondary | ICD-10-CM | POA: Diagnosis not present

## 2023-12-23 DIAGNOSIS — C50911 Malignant neoplasm of unspecified site of right female breast: Secondary | ICD-10-CM | POA: Diagnosis not present

## 2023-12-23 DIAGNOSIS — Z1152 Encounter for screening for COVID-19: Secondary | ICD-10-CM | POA: Diagnosis not present

## 2023-12-23 DIAGNOSIS — E1122 Type 2 diabetes mellitus with diabetic chronic kidney disease: Secondary | ICD-10-CM | POA: Diagnosis present

## 2023-12-23 DIAGNOSIS — J9600 Acute respiratory failure, unspecified whether with hypoxia or hypercapnia: Secondary | ICD-10-CM

## 2023-12-23 DIAGNOSIS — R051 Acute cough: Secondary | ICD-10-CM

## 2023-12-23 DIAGNOSIS — Z807 Family history of other malignant neoplasms of lymphoid, hematopoietic and related tissues: Secondary | ICD-10-CM

## 2023-12-23 DIAGNOSIS — I10 Essential (primary) hypertension: Secondary | ICD-10-CM | POA: Diagnosis present

## 2023-12-23 DIAGNOSIS — N250 Renal osteodystrophy: Secondary | ICD-10-CM | POA: Diagnosis not present

## 2023-12-23 DIAGNOSIS — Z79899 Other long term (current) drug therapy: Secondary | ICD-10-CM

## 2023-12-23 DIAGNOSIS — Z7982 Long term (current) use of aspirin: Secondary | ICD-10-CM | POA: Diagnosis not present

## 2023-12-23 DIAGNOSIS — I132 Hypertensive heart and chronic kidney disease with heart failure and with stage 5 chronic kidney disease, or end stage renal disease: Secondary | ICD-10-CM | POA: Diagnosis not present

## 2023-12-23 DIAGNOSIS — Z808 Family history of malignant neoplasm of other organs or systems: Secondary | ICD-10-CM

## 2023-12-23 DIAGNOSIS — Z833 Family history of diabetes mellitus: Secondary | ICD-10-CM | POA: Diagnosis not present

## 2023-12-23 DIAGNOSIS — Z820 Family history of epilepsy and other diseases of the nervous system: Secondary | ICD-10-CM

## 2023-12-23 DIAGNOSIS — I5032 Chronic diastolic (congestive) heart failure: Secondary | ICD-10-CM | POA: Diagnosis present

## 2023-12-23 DIAGNOSIS — N186 End stage renal disease: Secondary | ICD-10-CM | POA: Diagnosis not present

## 2023-12-23 DIAGNOSIS — J40 Bronchitis, not specified as acute or chronic: Secondary | ICD-10-CM | POA: Diagnosis present

## 2023-12-23 DIAGNOSIS — J9601 Acute respiratory failure with hypoxia: Principal | ICD-10-CM | POA: Diagnosis present

## 2023-12-23 DIAGNOSIS — I5033 Acute on chronic diastolic (congestive) heart failure: Secondary | ICD-10-CM | POA: Diagnosis not present

## 2023-12-23 DIAGNOSIS — R6520 Severe sepsis without septic shock: Secondary | ICD-10-CM | POA: Diagnosis not present

## 2023-12-23 DIAGNOSIS — Z79811 Long term (current) use of aromatase inhibitors: Secondary | ICD-10-CM

## 2023-12-23 DIAGNOSIS — Z1721 Progesterone receptor positive status: Secondary | ICD-10-CM

## 2023-12-23 DIAGNOSIS — R0989 Other specified symptoms and signs involving the circulatory and respiratory systems: Secondary | ICD-10-CM | POA: Diagnosis not present

## 2023-12-23 DIAGNOSIS — J984 Other disorders of lung: Secondary | ICD-10-CM | POA: Diagnosis not present

## 2023-12-23 LAB — CBC WITH DIFFERENTIAL/PLATELET
Abs Immature Granulocytes: 0.03 K/uL (ref 0.00–0.07)
Basophils Absolute: 0 K/uL (ref 0.0–0.1)
Basophils Relative: 0 %
Eosinophils Absolute: 0.1 K/uL (ref 0.0–0.5)
Eosinophils Relative: 1 %
HCT: 33.7 % — ABNORMAL LOW (ref 36.0–46.0)
Hemoglobin: 10.7 g/dL — ABNORMAL LOW (ref 12.0–15.0)
Immature Granulocytes: 0 %
Lymphocytes Relative: 5 %
Lymphs Abs: 0.4 K/uL — ABNORMAL LOW (ref 0.7–4.0)
MCH: 34.2 pg — ABNORMAL HIGH (ref 26.0–34.0)
MCHC: 31.8 g/dL (ref 30.0–36.0)
MCV: 107.7 fL — ABNORMAL HIGH (ref 80.0–100.0)
Monocytes Absolute: 0.4 K/uL (ref 0.1–1.0)
Monocytes Relative: 5 %
Neutro Abs: 7.3 K/uL (ref 1.7–7.7)
Neutrophils Relative %: 89 %
Platelets: 234 K/uL (ref 150–400)
RBC: 3.13 MIL/uL — ABNORMAL LOW (ref 3.87–5.11)
RDW: 14.6 % (ref 11.5–15.5)
WBC: 8.2 K/uL (ref 4.0–10.5)
nRBC: 0 % (ref 0.0–0.2)

## 2023-12-23 LAB — MRSA NEXT GEN BY PCR, NASAL: MRSA by PCR Next Gen: NOT DETECTED

## 2023-12-23 LAB — CBC
HCT: 30.1 % — ABNORMAL LOW (ref 36.0–46.0)
Hemoglobin: 9.7 g/dL — ABNORMAL LOW (ref 12.0–15.0)
MCH: 34.3 pg — ABNORMAL HIGH (ref 26.0–34.0)
MCHC: 32.2 g/dL (ref 30.0–36.0)
MCV: 106.4 fL — ABNORMAL HIGH (ref 80.0–100.0)
Platelets: 196 K/uL (ref 150–400)
RBC: 2.83 MIL/uL — ABNORMAL LOW (ref 3.87–5.11)
RDW: 15.3 % (ref 11.5–15.5)
WBC: 7.5 K/uL (ref 4.0–10.5)
nRBC: 0 % (ref 0.0–0.2)

## 2023-12-23 LAB — PROTIME-INR
INR: 1 (ref 0.8–1.2)
Prothrombin Time: 14 s (ref 11.4–15.2)

## 2023-12-23 LAB — CBG MONITORING, ED: Glucose-Capillary: 96 mg/dL (ref 70–99)

## 2023-12-23 LAB — RESP PANEL BY RT-PCR (RSV, FLU A&B, COVID)  RVPGX2
Influenza A by PCR: NEGATIVE
Influenza B by PCR: NEGATIVE
Resp Syncytial Virus by PCR: NEGATIVE
SARS Coronavirus 2 by RT PCR: NEGATIVE

## 2023-12-23 LAB — COMPREHENSIVE METABOLIC PANEL WITH GFR
ALT: 15 U/L (ref 0–44)
AST: 25 U/L (ref 15–41)
Albumin: 4.8 g/dL (ref 3.5–5.0)
Alkaline Phosphatase: 96 U/L (ref 38–126)
Anion gap: 17 — ABNORMAL HIGH (ref 5–15)
BUN: 18 mg/dL (ref 8–23)
CO2: 30 mmol/L (ref 22–32)
Calcium: 10.5 mg/dL — ABNORMAL HIGH (ref 8.9–10.3)
Chloride: 94 mmol/L — ABNORMAL LOW (ref 98–111)
Creatinine, Ser: 5.39 mg/dL — ABNORMAL HIGH (ref 0.44–1.00)
GFR, Estimated: 7 mL/min — ABNORMAL LOW (ref >60)
Glucose, Bld: 91 mg/dL (ref 70–99)
Potassium: 3.3 mmol/L — ABNORMAL LOW (ref 3.5–5.1)
Sodium: 140 mmol/L (ref 135–145)
Total Bilirubin: 0.4 mg/dL (ref 0.0–1.2)
Total Protein: 8.4 g/dL — ABNORMAL HIGH (ref 6.5–8.1)

## 2023-12-23 LAB — BLOOD GAS, VENOUS
Acid-Base Excess: 8.3 mmol/L — ABNORMAL HIGH (ref 0.0–2.0)
Bicarbonate: 34 mmol/L — ABNORMAL HIGH (ref 20.0–28.0)
Drawn by: 69769
O2 Saturation: 63.6 %
Patient temperature: 39.1
pCO2, Ven: 55 mmHg (ref 44–60)
pH, Ven: 7.41 (ref 7.25–7.43)
pO2, Ven: 42 mmHg (ref 32–45)

## 2023-12-23 LAB — LACTIC ACID, PLASMA: Lactic Acid, Venous: 1.1 mmol/L (ref 0.5–1.9)

## 2023-12-23 LAB — PRO BRAIN NATRIURETIC PEPTIDE: Pro Brain Natriuretic Peptide: >35000 pg/mL — ABNORMAL HIGH (ref <300.0)

## 2023-12-23 MED ORDER — SODIUM CHLORIDE 0.9 % IV SOLN
2.0000 g | Freq: Once | INTRAVENOUS | Status: AC
  Administered 2023-12-23: 2 g via INTRAVENOUS
  Filled 2023-12-23: qty 20

## 2023-12-23 MED ORDER — HEPARIN SODIUM (PORCINE) 5000 UNIT/ML IJ SOLN
5000.0000 [IU] | Freq: Three times a day (TID) | INTRAMUSCULAR | Status: DC
  Administered 2023-12-23 – 2023-12-27 (×10): 5000 [IU] via SUBCUTANEOUS
  Filled 2023-12-23 (×10): qty 1

## 2023-12-23 MED ORDER — SENNOSIDES-DOCUSATE SODIUM 8.6-50 MG PO TABS: 1.0000 | ORAL_TABLET | Freq: Every evening | ORAL | Status: DC | PRN

## 2023-12-23 MED ORDER — SUCRALFATE 1 GM/10ML PO SUSP
1.0000 g | Freq: Two times a day (BID) | ORAL | Status: DC
Start: 2023-12-23 — End: 2023-12-27
  Administered 2023-12-24 – 2023-12-27 (×6): 1 g via ORAL
  Filled 2023-12-23 (×7): qty 10

## 2023-12-23 MED ORDER — HEPARIN SODIUM (PORCINE) 1000 UNIT/ML IJ SOLN
INTRAMUSCULAR | Status: AC
  Filled 2023-12-23: qty 4

## 2023-12-23 MED ORDER — ATORVASTATIN CALCIUM 40 MG PO TABS
40.0000 mg | ORAL_TABLET | Freq: Every evening | ORAL | Status: DC
  Administered 2023-12-24 – 2023-12-26 (×3): 40 mg via ORAL
  Filled 2023-12-23 (×4): qty 1

## 2023-12-23 MED ORDER — PENTAFLUOROPROP-TETRAFLUOROETH EX AERO: 1.0000 | INHALATION_SPRAY | CUTANEOUS | Status: DC | PRN

## 2023-12-23 MED ORDER — ACYCLOVIR 200 MG PO CAPS
200.0000 mg | ORAL_CAPSULE | Freq: Every morning | ORAL | Status: DC
  Administered 2023-12-25 – 2023-12-27 (×3): 200 mg via ORAL
  Filled 2023-12-23 (×5): qty 1

## 2023-12-23 MED ORDER — ASPIRIN 81 MG PO TBEC
81.0000 mg | DELAYED_RELEASE_TABLET | Freq: Every day | ORAL | Status: DC
  Administered 2023-12-25 – 2023-12-27 (×3): 81 mg via ORAL
  Filled 2023-12-23 (×4): qty 1

## 2023-12-23 MED ORDER — METHYLPREDNISOLONE SODIUM SUCC 40 MG IJ SOLR
40.0000 mg | Freq: Two times a day (BID) | INTRAMUSCULAR | Status: DC
Start: 2023-12-23 — End: 2023-12-27
  Administered 2023-12-23 – 2023-12-27 (×8): 40 mg via INTRAVENOUS
  Filled 2023-12-23 (×9): qty 1

## 2023-12-23 MED ORDER — ACETAMINOPHEN 325 MG PO TABS: 650.0000 mg | ORAL_TABLET | Freq: Four times a day (QID) | ORAL | Status: DC | PRN

## 2023-12-23 MED ORDER — VANCOMYCIN HCL 750 MG/150ML IV SOLN
750.0000 mg | INTRAVENOUS | Status: DC
  Filled 2023-12-23 (×2): qty 150

## 2023-12-23 MED ORDER — ALTEPLASE 2 MG IJ SOLR: 2.0000 mg | Freq: Once | INTRAMUSCULAR | Status: DC | PRN

## 2023-12-23 MED ORDER — HEPARIN SODIUM (PORCINE) 1000 UNIT/ML DIALYSIS
1000.0000 [IU] | INTRAMUSCULAR | Status: DC | PRN
  Administered 2023-12-23: 3800 [IU]

## 2023-12-23 MED ORDER — SODIUM CHLORIDE 0.9 % IV SOLN
100.0000 mg | INTRAVENOUS | Status: AC
Start: 2023-12-23 — End: 2023-12-23
  Administered 2023-12-23: 100 mg via INTRAVENOUS
  Filled 2023-12-23: qty 100

## 2023-12-23 MED ORDER — ACETAMINOPHEN 650 MG RE SUPP
650.0000 mg | Freq: Once | RECTAL | Status: AC
  Administered 2023-12-23: 650 mg via RECTAL
  Filled 2023-12-23 (×2): qty 1

## 2023-12-23 MED ORDER — ANTICOAGULANT SODIUM CITRATE 4% (200MG/5ML) IV SOLN: 5.0000 mL | Status: DC | PRN

## 2023-12-23 MED ORDER — LIDOCAINE HCL (PF) 1 % IJ SOLN: 5.0000 mL | INTRAMUSCULAR | Status: DC | PRN

## 2023-12-23 MED ORDER — SODIUM CHLORIDE 0.9 % IV SOLN
2.0000 g | Freq: Once | INTRAVENOUS | Status: AC
  Administered 2023-12-23: 2 g via INTRAVENOUS
  Filled 2023-12-23: qty 12.5

## 2023-12-23 MED ORDER — LIDOCAINE-PRILOCAINE 2.5-2.5 % EX CREA: 1.0000 | TOPICAL_CREAM | CUTANEOUS | Status: DC | PRN

## 2023-12-23 MED ORDER — ACETAMINOPHEN 650 MG RE SUPP: 650.0000 mg | Freq: Four times a day (QID) | RECTAL | Status: DC | PRN

## 2023-12-23 MED ORDER — PANTOPRAZOLE SODIUM 40 MG PO TBEC
40.0000 mg | DELAYED_RELEASE_TABLET | Freq: Two times a day (BID) | ORAL | Status: DC
  Administered 2023-12-24 – 2023-12-27 (×6): 40 mg via ORAL
  Filled 2023-12-23 (×7): qty 1

## 2023-12-23 MED ORDER — ALBUTEROL SULFATE (2.5 MG/3ML) 0.083% IN NEBU
2.5000 mg | INHALATION_SOLUTION | RESPIRATORY_TRACT | Status: DC | PRN
  Administered 2023-12-24: 2.5 mg via RESPIRATORY_TRACT
  Filled 2023-12-23: qty 3

## 2023-12-23 MED ORDER — VANCOMYCIN HCL 1500 MG/300ML IV SOLN
1500.0000 mg | Freq: Once | INTRAVENOUS | Status: AC
  Administered 2023-12-23: 1500 mg via INTRAVENOUS
  Filled 2023-12-23: qty 300

## 2023-12-23 MED ORDER — HYDRALAZINE HCL 20 MG/ML IJ SOLN: 10.0000 mg | INTRAMUSCULAR | Status: DC | PRN

## 2023-12-23 MED ORDER — SODIUM CHLORIDE 0.9 % IV SOLN: 2.0000 g | INTRAVENOUS | Status: DC

## 2023-12-23 MED ORDER — ACETAMINOPHEN 500 MG PO TABS
1000.0000 mg | ORAL_TABLET | Freq: Once | ORAL | Status: DC
  Filled 2023-12-23: qty 2

## 2023-12-23 MED ORDER — CHLORHEXIDINE GLUCONATE CLOTH 2 % EX PADS
6.0000 | MEDICATED_PAD | Freq: Every day | CUTANEOUS | Status: DC
  Administered 2023-12-24 – 2023-12-26 (×3): 6 via TOPICAL

## 2023-12-23 NOTE — Procedures (Signed)
 Received patient in bed to unit.  Alert and oriented to self.  Informed consent signed and in chart.   Pt was escorted to unit by nurse and respiratory. Pt came with Bi Pap and IV meds running. Pt's left catheter was cleansed and tx was started. Pt lines had to be reversed during the start of tx d/t not being able easily pull from arterial line. Pt's catheter dressing was changed. Pt site had no signs of infection present.   During tx pt BFR was reduced to 350 r/t recurring low arterial pressures. 1.5 hrs into pt tx reduced BFR to 300 d/t alarming of arterial pressure.   Pt bp declined, pt's blood was returned, and saline bolus was given to bring pt BP back up.  Pt had no complaints and no signs of distress was noted.Respiratory assisted patient to her room.   TX duration: 3hrs  Patient tolerated well.  Transported back to the room  Alert, without acute distress.  Hand-off given to patient's nurse.   Access used: Left chest catheter Access issues: Hard to pull from Arterial line  Total UF removed: 2.7L Medication(s) given: see MAR Post HD weight: N/A Pt on stretcher Post HD VS:  140/72BP, 99.8T, 100O2-BiPAP, 84P   [facility]

## 2023-12-23 NOTE — Progress Notes (Signed)
 Location:  [facility] Room Number: 146 Place of Service:  SNF (31)   CODE STATUS: dnr   Allergies  Allergen Reactions   Ace Inhibitors Other (See Comments) and Cough    Tongue swelling Angioedema    Angiotensin Receptor Blockers Other (See Comments)    Angioedema with ACE-I   Penicillins Other (See Comments)    Unknown reaction    Chief Complaint  Patient presents with   Acute Visit    Cough congestion     HPI:  She was hematology today for her treatment was found to have a cough and congestion. Her temp orally is 100.3.  she has rhonchi throughout her lung fields. She is more short of breath. Hematology is insisting that she go the ED for further workup.   Past Medical History:  Diagnosis Date   Anemia     chronic macrocytic anemia   Anxiety    Chronic kidney disease    Chronic renal disease, stage 4, severely decreased glomerular filtration rate (GFR) between 15-29 mL/min/1.73 square meter (HCC) 08/22/2015   Complication of anesthesia    delirious after Breast Surgery   Dementia (HCC)    mild   Depression    Diabetes mellitus with ESRD (end-stage renal disease) (HCC)    type II   Dysphagia    Dyspnea    with activity   GERD (gastroesophageal reflux disease)    Glaucoma    Hyperlipidemia    Hypertension    Multiple myeloma (HCC)    Pneumonia    Stage 1 infiltrating ductal carcinoma of right female breast (HCC) 08/21/2015   ER+ PR+ HER 2 neu + (3+) T1cN0     Past Surgical History:  Procedure Laterality Date   A/V FISTULAGRAM Right 07/05/2020   Procedure: A/V Fistulagram;  Surgeon: Harvey Carlin BRAVO, MD;  Location: [facility];  Service: Cardiovascular;  Laterality: Right;   AV FISTULA PLACEMENT Left 11/22/2017   Procedure: ARTERIOVENOUS (AV) FISTULA CREATION LEFT ARM;  Surgeon: Harvey Carlin BRAVO, MD;  Location: [facility] OR;  Service: Vascular;  Laterality: Left;   AV FISTULA PLACEMENT Right 04/04/2020   Procedure: RIGHT ARM  ARTERIOVENOUS FISTULA CREATION;  Surgeon: Oris Krystal FALCON, MD;  Location: [facility];  Service: Vascular;  Laterality: Right;   AV FISTULA PLACEMENT Right 08/20/2020   Procedure: ARTERIOVENOUS (AV) FISTULA LIGATION RIGHT ARM;  Surgeon: Harvey Carlin BRAVO, MD;  Location: [facility] OR;  Service: Vascular;  Laterality: Right;   BIOPSY  08/07/2016   Procedure: BIOPSY;  Surgeon: Shaaron [name], MD;  Location: [facility];  Service: Endoscopy;;  gastric ulcer biopsy   COLONOSCOPY     ESOPHAGOGASTRODUODENOSCOPY N/A 08/07/2016   LA Grade A esophagitis s/p dilation, small hiatal hernia, multiple gastric ulcers and erosions, duodenal erosions s/p biopsy. Negative H.pylori    ESOPHAGOGASTRODUODENOSCOPY N/A 11/27/2016   normal esophagus, previously noted gastric ulcers completely healed, normal duodenum.    ESOPHAGOGASTRODUODENOSCOPY (EGD) WITH PROPOFOL  N/A 07/23/2020   Procedure: ESOPHAGOGASTRODUODENOSCOPY (EGD) WITH PROPOFOL ;  Surgeon: Golda Claudis PENNER, MD;  Location: [facility];  Service: Endoscopy;  Laterality: N/A;   FISTULA SUPERFICIALIZATION Left 02/14/2018   Procedure: FISTULA SUPERFICIALIZATION LEFT ARM;  Surgeon: Eliza Lonni RAMAN, MD;  Location: [facility] OR;  Service: Vascular;  Laterality: Left;   FRACTURE SURGERY Right    ankle   HOT HEMOSTASIS  07/23/2020   Procedure: HOT HEMOSTASIS (ARGON PLASMA COAGULATION/BICAP);  Surgeon: Golda Claudis PENNER, MD;  [facility]: [facility];  Service: Endoscopy;;  INTRAMEDULLARY (IM) NAIL INTERTROCHANTERIC Right 07/12/2020   Procedure: INTRAMEDULLARY (IM) NAIL INTERTROCHANTRIC;  Surgeon: Onesimo Oneil LABOR, MD;  Location: [facility];  Service: Orthopedics;  Laterality: Right;   IR FLUORO GUIDE CV LINE LEFT  12/21/2022   IR US  GUIDE VASC ACCESS LEFT  12/21/2022   MALONEY DILATION N/A 08/07/2016   Procedure: AGAPITO DILATION;  Surgeon: Shaaron [name], MD;  [facility]: [facility];  Service: Endoscopy;  Laterality: N/A;   MASTECTOMY, PARTIAL Right    PERIPHERAL VASCULAR  BALLOON ANGIOPLASTY Left 07/13/2019   Procedure: PERIPHERAL VASCULAR BALLOON ANGIOPLASTY;  Surgeon: Gretta Lonni PARAS, MD;  Location: [facility];  Service: Cardiovascular;  Laterality: Left;  arm fistulogram   PERIPHERAL VASCULAR BALLOON ANGIOPLASTY Right 05/22/2020   Procedure: PERIPHERAL VASCULAR BALLOON ANGIOPLASTY;  Surgeon: Magda Debby SAILOR, MD;  Location: [facility];  Service: Cardiovascular;  Laterality: Right;  arm fistula   PERIPHERAL VASCULAR BALLOON ANGIOPLASTY Right 07/05/2020   Procedure: PERIPHERAL VASCULAR BALLOON ANGIOPLASTY;  Surgeon: Harvey Carlin BRAVO, MD;  Location: [facility];  Service: Cardiovascular;  Laterality: Right;  arm fistula   PORT-A-CATH REMOVAL Left 11/22/2017   Procedure: REMOVAL PORT-A-CATH LEFT CHEST;  Surgeon: Harvey Carlin BRAVO, MD;  Location: [facility] OR;  Service: Vascular;  Laterality: Left;   RETINAL DETACHMENT SURGERY Right    SCLEROTHERAPY  07/23/2020   Procedure: SCLEROTHERAPY;  Surgeon: Golda Claudis PENNER, MD;  Location: [facility];  Service: Endoscopy;;   TUNNELLED CATHETER EXCHANGE N/A 08/16/2023   Procedure: TUNNELLED CATHETER EXCHANGE;  Surgeon: Magda Debby SAILOR, MD;  Location: [facility];  Service: Cardiovascular;  Laterality: N/A;   UPPER EXTREMITY VENOGRAPHY N/A 08/22/2020   Procedure: LEFT UPPER & CENTRAL VENOGRAPHY;  Surgeon: Gretta Lonni PARAS, MD;  Location: [facility];  Service: Cardiovascular;  Laterality: N/A;    Social History   Socioeconomic History   Marital status: Single    Spouse name: Not on file   Number of children: Not on file   Years of education: Not on file   Highest education level: Not on file  Occupational History   Occupation: retired   Tobacco Use   Smoking status: Never   Smokeless tobacco: Never  Vaping Use   Vaping status: Never Used  Substance and Sexual Activity   Alcohol  use: No    Alcohol /week: 0.0 standard drinks of alcohol    Drug use: No   Sexual activity: Never   Other Topics Concern   Not on file  Social History Narrative   Long term resident of SNF    Social Drivers of Health   Financial Resource Strain: Low Risk  (01/09/2020)   Overall Financial Resource Strain (CARDIA)    Difficulty of Paying Living Expenses: Not very hard  Food Insecurity: Patient Unable To Answer (05/24/2023)   Hunger Vital Sign    Worried About Running Out of Food in the Last Year: Patient unable to answer    Ran Out of Food in the Last Year: Patient unable to answer  Transportation Needs: Patient Unable To Answer (05/24/2023)   PRAPARE - Transportation    Lack of Transportation (Medical): Patient unable to answer    Lack of Transportation (Non-Medical): Patient unable to answer  Physical Activity: Inactive (01/09/2020)   Exercise Vital Sign    Days of Exercise per Week: 0 days    Minutes of Exercise per Session: 0 min  Stress: No Stress Concern Present (01/09/2020)   [employer] of Occupational Health - Occupational  Stress Questionnaire    Feeling of Stress : Not at all  Social Connections: Patient Unable To Answer (05/24/2023)   Social Connection and Isolation Panel    Frequency of Communication with Friends and Family: Patient unable to answer    Frequency of Social Gatherings with Friends and Family: Patient unable to answer    Attends Religious Services: Patient unable to answer    Active Member of Clubs or Organizations: Patient unable to answer    Attends Banker Meetings: Patient unable to answer    Marital Status: Patient unable to answer  Intimate Partner Violence: Patient Unable To Answer (05/24/2023)   Humiliation, Afraid, Rape, and Kick questionnaire    Fear of Current or Ex-Partner: Patient unable to answer    Emotionally Abused: Patient unable to answer    Physically Abused: Patient unable to answer    Sexually Abused: Patient unable to answer   Family History  Problem Relation Age of Onset   Multiple myeloma Sister    Brain  cancer Sister    Dementia Mother        died at 24   Stroke Mother    Heart failure Mother    Diabetes Mother    Heart disease Father    Prostate cancer Brother    Colon cancer Neg Hx       VITAL SIGNS BP (!) 156/108   Pulse 88   Temp 100.3 F (37.9 C) (Oral)   Resp (!) 22   Ht 5' 3 (1.6 m)   Wt 150 lb (68 kg)   SpO2 95%   BMI 26.57 kg/m   Outpatient Encounter Medications as of 12/23/2023  Medication Sig   acetaminophen  (TYLENOL ) 325 MG tablet Take 650 mg by mouth 3 (three) times daily.   acyclovir  (ZOVIRAX ) 200 MG capsule Take 200 mg by mouth in the morning. (0800)   anastrozole  (ARIMIDEX ) 1 MG tablet TAKE 1 TABLET BY MOUTH DAILY   aspirin  EC 81 MG tablet Take 1 tablet (81 mg total) by mouth daily. Swallow whole.   atorvastatin  (LIPITOR) 40 MG tablet Take 1 tablet (40 mg total) by mouth daily.   Calcium  Carb-Cholecalciferol  (CALCIUM  + VITAMIN D3) 500-5 MG-MCG TABS Take 1 tablet by mouth daily.   melatonin 5 MG TABS Take 5 mg by mouth at bedtime.   multivitamin (RENA-VIT) TABS tablet Take 1 tablet by mouth at bedtime.   Nutritional Supplements (ENSURE ENLIVE PO) Take 237 mLs by mouth every evening.   ondansetron  (ZOFRAN -ODT) 4 MG disintegrating tablet Take 4 mg by mouth daily before breakfast.   pantoprazole  (PROTONIX ) 40 MG tablet Take 40 mg by mouth 2 (two) times daily.   sennosides-docusate sodium (SENOKOT-S) 8.6-50 MG tablet Take 1 tablet by mouth 2 (two) times daily.   sertraline  (ZOLOFT ) 25 MG tablet Take 25 mg by mouth daily. Was taking 50 mg but it stopped on 05-25-2023 (Patient not taking: Reported on 12/17/2023)   sevelamer  carbonate (RENVELA ) 800 MG tablet Take 800 mg by mouth daily. (Patient taking differently: Take 800 mg by mouth 3 (three) times daily with meals.)   sucralfate  (CARAFATE ) 1 GM/10ML suspension Take 10 mLs by mouth 2 (two) times daily.   Facility-Administered Encounter Medications as of 12/23/2023  Medication   lanreotide acetate  (SOMATULINE  DEPOT) 120 MG/0.5ML injection   lanreotide acetate  (SOMATULINE DEPOT ) 120 MG/0.5ML injection   lanreotide acetate  (SOMATULINE DEPOT ) 120 MG/0.5ML injection   lanreotide acetate  (SOMATULINE DEPOT ) 120 MG/0.5ML injection   octreotide  (SANDOSTATIN  LAR) 30 MG  IM injection   octreotide  (SANDOSTATIN  LAR) 30 MG IM injection     SIGNIFICANT DIAGNOSTIC EXAMS  LABS REVIEWED PREVIOUS      11-10-22: wbc 4.0; hgb 10.7; hct 33.4; mcv 102.1 plt 187; glucose 110; bun 28; creat 6.16; k+ 4.8; na++ 130; ca 9.0; gfr 6; protein 6.8 albumin  3.6 mag 2.4  12-16-22: wbc 6.7; hgb 12.4; hct 38.3; mcv 101.3 plt 201; glucose 123; bun 22; creat 4.81; k+ 4.1; na++ 133; ca 9.4; gfr 8; protein 7.9; albumin  4.1; blood culture: staphylococcus hominis  12-22-22: wbc 4.2; hgb 9.8; hct 30.1; mcv 100.0; plt 184; glucose 90; bun 81; creat 12.02; k+ 5.0; na++ 136; ca 8.8; gfr 3; phos 10.8; albumin  3.2 02-16-23: wbc 4.9; hgb 9.2; hct 28.8; mcv 104.7 plt 215 glucose 194; bun 32; creat 8.17; k+ 5.1; na++ 9.8;gfr 4; protein 7.5 albumin  3.9 mag 2.6   05-27-23: uric acid 3.4 07-01-23: wbc 4.1; hgb 11.2; hct 35.2; mcv 101.4 plt 197;glucose 145; bun 22; creat 6.07; k+ 3.8; na++ 135; ca 9.4 gfr 6 protein 7.2 albumin  3.6 mag 2.4  08-26-23: wbc 4.0; hgb 10.5; hct 32.8; mcv 103.5 plt 197; glucose 116; bun 24; creat 5.78; k+ 4.2; na++ 135; ca 9.5 gfr 7; protein 7.2 albumin  3.6 mag 2.3   TODAY  11-11-23: wbc 3.9; hgb 9.8; hct 30.7; mcv 108.5 plt 166; glucose 170; bun 22; creat 5.47; k+ 4.0; na++ 136; ca 9.4 gfr 7 protein 7.0 albumin  3.6; mag 2.3    Review of Systems  Constitutional:  Negative for malaise/fatigue.  Respiratory:  Positive for cough, sputum production, shortness of breath and wheezing.   Cardiovascular:  Negative for chest pain, palpitations and leg swelling.  Gastrointestinal:  Negative for abdominal pain, constipation and heartburn.  Musculoskeletal:  Negative for back pain, joint pain and myalgias.  Skin: Negative.    Neurological:  Negative for dizziness.  Psychiatric/Behavioral:  The patient is not nervous/anxious.     Physical Exam Constitutional:      General: She is not in acute distress.    Appearance: She is well-developed. She is not diaphoretic.  Neck:     Thyroid : No thyromegaly.  Cardiovascular:     Rate and Rhythm: Regular rhythm. Tachycardia present.     Heart sounds: Normal heart sounds.  Pulmonary:     Effort: Pulmonary effort is normal. No respiratory distress.     Breath sounds: Wheezing and rhonchi present.  Abdominal:     General: Bowel sounds are normal. There is no distension.     Palpations: Abdomen is soft.     Tenderness: There is no abdominal tenderness.  Musculoskeletal:        General: Normal range of motion.     Cervical back: Neck supple.     Right lower leg: No edema.     Left lower leg: No edema.  Lymphadenopathy:     Cervical: No cervical adenopathy.  Skin:    General: Skin is warm and dry.  Neurological:     Mental Status: She is alert. Mental status is at baseline.  Psychiatric:        Mood and Affect: Mood normal.       ASSESSMENT/ PLAN:  TODAY  Cough and congestion: the plan of care was for stat chest x-ray; cbc with diff; cmp; lactic acid level; blood culture X2 sites and to begin ceftriaxone. However; will send her to the ED at this time.    Barnie Seip NP [facility]  call [phone]

## 2023-12-23 NOTE — H&P (Signed)
 History and Physical    Carly Wood FMW:[id number] DOB: [birth year] DOA: 12/23/2023  PCP: [name], NP   Patient coming from: Nursing home I have personally briefly reviewed patient's old medical records in [facility]  Chief Complaint: Fever, cough, congestion  HPI: Carly Wood is a 88 y.o. female with medical history significant of ESRD on hemodialysis MWF, multiple myeloma, breast cancer on treatment, dementia, depression, hypertension, hyperlipidemia who was sent from pain nursing center for evaluation of fever, cough, congestion, rhonchi. Apparently patient was sent to hematology for treatment where she was found to have fever, cough and congestion.  They then recommended transfer to ED for further workup.  Patient herself is not able to provide history.  She is able to answer some basic questions.  Apparently patient and several other residents have had a fever and cough.  Patient apparently has been more drowsy than usual.  She also received a COVID-vaccine yesterday.   ED Course: In the ER she appeared ill, she was tachycardic, febrile, wheezy on exam.  Blood pressure stable.  Chest x-ray did not reveal any acute cardiopulmonary abnormalities.  Patient was initially placed on nasal cannula however later switched to BiPAP due to increasing hypoxia.  ABG reveals pH 7.41, pCO2 55, pO2 42.  BNP 140, potassium 3.3, creatinine 5.39, calcium  10.5.  Lactic acid 1.1.  Viral respiratory panel including influenza A, influenza B, COVID-19, RSV are negative.  Received IV ceftriaxone and doxycycline .  Nephrology was consulted by ER and she is being planned for hemodialysis today Hospitalist service was called to evaluate the patient.   Review of Systems: Review of system could not be obtained due to patient's mental status.   Past Medical History:  Diagnosis Date   Anemia     chronic macrocytic anemia   Anxiety    Chronic kidney disease    Chronic renal disease, stage 4,  severely decreased glomerular filtration rate (GFR) between 15-29 mL/min/1.73 square meter (HCC) 08/22/2015   Complication of anesthesia    delirious after Breast Surgery   Dementia (HCC)    mild   Depression    Diabetes mellitus with ESRD (end-stage renal disease) (HCC)    type II   Dysphagia    Dyspnea    with activity   GERD (gastroesophageal reflux disease)    Glaucoma    Hyperlipidemia    Hypertension    Multiple myeloma (HCC)    Pneumonia    Stage 1 infiltrating ductal carcinoma of right female breast (HCC) 08/21/2015   ER+ PR+ HER 2 neu + (3+) T1cN0     Past Surgical History:  Procedure Laterality Date   A/V FISTULAGRAM Right 07/05/2020   Procedure: A/V Fistulagram;  Surgeon: Harvey Carlin BRAVO, MD;  [facility];  Service: Cardiovascular;  Laterality: Right;   AV FISTULA PLACEMENT Left 11/22/2017   Procedure: ARTERIOVENOUS (AV) FISTULA CREATION LEFT ARM;  Surgeon: Harvey Carlin BRAVO, MD;  Location: [facility] OR;  Service: Vascular;  Laterality: Left;   AV FISTULA PLACEMENT Right 04/04/2020   Procedure: RIGHT ARM ARTERIOVENOUS FISTULA CREATION;  Surgeon: Oris Krystal FALCON, MD;  Location: [facility];  Service: Vascular;  Laterality: Right;   AV FISTULA PLACEMENT Right 08/20/2020   Procedure: ARTERIOVENOUS (AV) FISTULA LIGATION RIGHT ARM;  Surgeon: Harvey Carlin BRAVO, MD;  Location: [facility] OR;  Service: Vascular;  Laterality: Right;   BIOPSY  08/07/2016   Procedure: BIOPSY;  Surgeon: Shaaron [name], MD;  Location: [facility];  Service: Endoscopy;;  gastric ulcer biopsy   COLONOSCOPY     ESOPHAGOGASTRODUODENOSCOPY N/A 08/07/2016   LA Grade A esophagitis s/p dilation, small hiatal hernia, multiple gastric ulcers and erosions, duodenal erosions s/p biopsy. Negative H.pylori    ESOPHAGOGASTRODUODENOSCOPY N/A 11/27/2016   normal esophagus, previously noted gastric ulcers completely healed, normal duodenum.    ESOPHAGOGASTRODUODENOSCOPY (EGD) WITH PROPOFOL  N/A 07/23/2020    Procedure: ESOPHAGOGASTRODUODENOSCOPY (EGD) WITH PROPOFOL ;  Surgeon: Golda Claudis PENNER, MD;  [facility];  Service: Endoscopy;  Laterality: N/A;   FISTULA SUPERFICIALIZATION Left 02/14/2018   Procedure: FISTULA SUPERFICIALIZATION LEFT ARM;  Surgeon: Eliza Lonni RAMAN, MD;  Location: [facility] OR;  Service: Vascular;  Laterality: Left;   FRACTURE SURGERY Right    ankle   HOT HEMOSTASIS  07/23/2020   Procedure: HOT HEMOSTASIS (ARGON PLASMA COAGULATION/BICAP);  Surgeon: Golda Claudis PENNER, MD;  [facility];  Service: Endoscopy;;   INTRAMEDULLARY (IM) NAIL INTERTROCHANTERIC Right 07/12/2020   Procedure: INTRAMEDULLARY (IM) NAIL INTERTROCHANTRIC;  Surgeon: Onesimo Oneil LABOR, MD;  Location: [facility];  Service: Orthopedics;  Laterality: Right;   IR FLUORO GUIDE CV LINE LEFT  12/21/2022   IR US  GUIDE VASC ACCESS LEFT  12/21/2022   MALONEY DILATION N/A 08/07/2016   Procedure: AGAPITO DILATION;  Surgeon: Shaaron [name], MD;  [facility];  Service: Endoscopy;  Laterality: N/A;   MASTECTOMY, PARTIAL Right    PERIPHERAL VASCULAR BALLOON ANGIOPLASTY Left 07/13/2019   Procedure: PERIPHERAL VASCULAR BALLOON ANGIOPLASTY;  Surgeon: Gretta Lonni PARAS, MD;  [facility];  Service: Cardiovascular;  Laterality: Left;  arm fistulogram   PERIPHERAL VASCULAR BALLOON ANGIOPLASTY Right 05/22/2020   Procedure: PERIPHERAL VASCULAR BALLOON ANGIOPLASTY;  Surgeon: Magda Debby SAILOR, MD;  [facility]: [facility];  Service: Cardiovascular;  Laterality: Right;  arm fistula   PERIPHERAL VASCULAR BALLOON ANGIOPLASTY Right 07/05/2020   Procedure: PERIPHERAL VASCULAR BALLOON ANGIOPLASTY;  Surgeon: Harvey Carlin BRAVO, MD;  [facility];  Service: Cardiovascular;  Laterality: Right;  arm fistula   PORT-A-CATH REMOVAL Left 11/22/2017   Procedure: REMOVAL PORT-A-CATH LEFT CHEST;  Surgeon: Harvey Carlin BRAVO, MD;  Location[facility] OR;  Service: Vascular;  Laterality: Left;    RETINAL DETACHMENT SURGERY Right    SCLEROTHERAPY  07/23/2020   Procedure: SCLEROTHERAPY;  Surgeon: Golda Claudis PENNER, MD;  [facility];  Service: Endoscopy;;   TUNNELLED CATHETER EXCHANGE N/A 08/16/2023   Procedure: TUNNELLED CATHETER EXCHANGE;  Surgeon: Magda Debby SAILOR, MD;  Location: [facility];  Service: Cardiovascular;  Laterality: N/A;   UPPER EXTREMITY VENOGRAPHY N/A 08/22/2020   Procedure: LEFT UPPER & CENTRAL VENOGRAPHY;  Surgeon: Gretta Lonni PARAS, MD;  [facility]: [facility];  Service: Cardiovascular;  Laterality: N/A;     reports that she has never smoked. She has never used smokeless tobacco. She reports that she does not drink alcohol  and does not use drugs.  Allergies  Allergen Reactions   Ace Inhibitors Other (See Comments) and Cough    Tongue swelling Angioedema    Angiotensin Receptor Blockers Other (See Comments)    Angioedema with ACE-I   Penicillins Other (See Comments)    Unknown reaction    Family History  Problem Relation Age of Onset   Multiple myeloma Sister    Brain cancer Sister    Dementia Mother        died at 57   Stroke Mother    Heart failure Mother    Diabetes Mother  Heart disease Father    Prostate cancer Brother    Colon cancer Neg Hx     Prior to Admission medications   Medication Sig Start Date End Date Taking? Authorizing Provider  acetaminophen  (TYLENOL ) 325 MG tablet Take 650 mg by mouth 3 (three) times daily.    [provider]  acyclovir  (ZOVIRAX ) 200 MG capsule Take 200 mg by mouth in the morning. (0800) 10/20/19   [provider]  anastrozole  (ARIMIDEX ) 1 MG tablet TAKE 1 TABLET BY MOUTH DAILY 11/05/20   [name], MD  aspirin  EC 81 MG tablet Take 1 tablet (81 mg total) by mouth daily. Swallow whole. 05/28/23   Evonnie Lenis, MD  atorvastatin  (LIPITOR) 40 MG tablet Take 1 tablet (40 mg total) by mouth daily. 05/28/23   Evonnie Lenis, MD  Calcium  Carb-Cholecalciferol  (CALCIUM  + VITAMIN D3)  500-5 MG-MCG TABS Take 1 tablet by mouth daily.    [provider]  melatonin 5 MG TABS Take 5 mg by mouth at bedtime.    [provider]  multivitamin (RENA-VIT) TABS tablet Take 1 tablet by mouth at bedtime.    [provider]  Nutritional Supplements (ENSURE ENLIVE PO) Take 237 mLs by mouth every evening.    [provider]  ondansetron  (ZOFRAN -ODT) 4 MG disintegrating tablet Take 4 mg by mouth daily before breakfast. 01/01/22   [provider]  pantoprazole  (PROTONIX ) 40 MG tablet Take 40 mg by mouth 2 (two) times daily.    [provider]  sennosides-docusate sodium (SENOKOT-S) 8.6-50 MG tablet Take 1 tablet by mouth 2 (two) times daily.    [provider]  sertraline  (ZOLOFT ) 25 MG tablet Take 25 mg by mouth daily. Was taking 50 mg but it stopped on 05-25-2023 Patient not taking: Reported on 12/17/2023 11/01/22   [provider]  sevelamer  carbonate (RENVELA ) 800 MG tablet Take 800 mg by mouth daily. Patient taking differently: Take 800 mg by mouth 3 (three) times daily with meals. 01/05/23   [provider]  sucralfate  (CARAFATE ) 1 GM/10ML suspension Take 10 mLs by mouth 2 (two) times daily.    [provider]    Physical Exam: Vitals:   12/23/23 1230 12/23/23 1328 12/23/23 1402 12/23/23 1502  BP:      Pulse:  (!) 104    Resp:  (!) 29    Temp:   (!) 102.3 F (39.1 C) 99.2 F (37.3 C)  TempSrc:   Rectal Oral  SpO2:  95%    Weight: 66.2 kg     Height: 5' 3 (1.6 m)       Constitutional: NAD, calm, comfortable Vitals:   12/23/23 1230 12/23/23 1328 12/23/23 1402 12/23/23 1502  BP:      Pulse:  (!) 104    Resp:  (!) 29    Temp:   (!) 102.3 F (39.1 C) 99.2 F (37.3 C)  TempSrc:   Rectal Oral  SpO2:  95%    Weight: 66.2 kg     Height: 5' 3 (1.6 m)      General: Ill-looking, on a BiPAP, able to open eyes, able to tell date of birth HEENT: On BiPAP, CVS: S1, S2, no murmur, regular  rhythm Chest: Mild wheezing bilaterally, diminished bilaterally, crackles bilaterally Abdomen: Soft, nontender Extremities: Bilateral lower extremity edema  Labs on Admission: I have personally reviewed following labs and imaging studies  CBC: Recent Labs  Lab 12/23/23 1250 12/23/23 1453  WBC 8.2 7.5  NEUTROABS 7.3  --  HGB 10.7* 9.7*  HCT 33.7* 30.1*  MCV 107.7* 106.4*  PLT 234 196   Basic Metabolic Panel: Recent Labs  Lab 12/23/23 1250  NA 140  K 3.3*  CL 94*  CO2 30  GLUCOSE 91  BUN 18  CREATININE 5.39*  CALCIUM  10.5*   GFR: Estimated Creatinine Clearance: 7 mL/min (A) (by C-G formula based on SCr of 5.39 mg/dL (H)). Liver Function Tests: Recent Labs  Lab 12/23/23 1250  AST 25  ALT 15  ALKPHOS 96  BILITOT 0.4  PROT 8.4*  ALBUMIN  4.8   No results for input(s): LIPASE, AMYLASE in the last 168 hours. No results for input(s): AMMONIA in the last 168 hours. Coagulation Profile: Recent Labs  Lab 12/23/23 1250  INR 1.0   Cardiac Enzymes: No results for input(s): CKTOTAL, CKMB, CKMBINDEX, TROPONINI in the last 168 hours. BNP (last 3 results) No results for input(s): PROBNP in the last 8760 hours. HbA1C: No results for input(s): HGBA1C in the last 72 hours. CBG: Recent Labs  Lab 12/23/23 1246  GLUCAP 96   Lipid Profile: No results for input(s): CHOL, HDL, LDLCALC, TRIG, CHOLHDL, LDLDIRECT in the last 72 hours. Thyroid  Function Tests: No results for input(s): TSH, T4TOTAL, FREET4, T3FREE, THYROIDAB in the last 72 hours. Anemia Panel: No results for input(s): VITAMINB12, FOLATE, FERRITIN, TIBC, IRON , RETICCTPCT in the last 72 hours. Urine analysis: No results found for: COLORURINE, APPEARANCEUR, LABSPEC, PHURINE, GLUCOSEU, HGBUR, BILIRUBINUR, KETONESUR, PROTEINUR, UROBILINOGEN, NITRITE, LEUKOCYTESUR  Radiological Exams on Admission: DG Chest Portable 1 View Result Date:  12/23/2023 CLINICAL DATA:  ams, cough, fever EXAM: PORTABLE CHEST - 1 VIEW COMPARISON:  03/16/2023 FINDINGS: Left IJ approach hemodialysis catheter in place terminating in the high right atrium, unchanged. Low lung volumes. No focal airspace consolidation, pleural effusion, or pneumothorax. Moderate cardiomegaly. Tortuous aorta with aortic atherosclerosis. No acute fracture or destructive lesions. Multilevel thoracic osteophytosis. Surgical clips in the right breast/axilla. IMPRESSION: Low lung volumes.  Otherwise, no acute cardiopulmonary abnormality. Electronically Signed   By: Rogelia Myers M.D.   On: 12/23/2023 13:47    EKG: Independently reviewed.  Sinus tachycardia.  No ST elevation  Assessment/Plan  Fever/cough and congestion/wheezing  -Viral respiratory panel including COVID-19, influenza A, influenza B and RSV negative.  Will obtain a sputum culture if able - Initiated on broad-spectrum antibiotics given immunocompromise state. - Chest x-ray negative for acute cardiopulmonary abnormalities.  Will obtain CT scan - Follow-up on cultures.  Acute hypoxic respiratory failure  -Initiated on nasal cannula in the ER escalated to BiPAP due to hypoxia.  Will continue BiPAP for now.  ABG shows pH of 7.4, pCO2 55, pO2 45. - Secondary to possible infectious illness versus ESRD/hypervolemia.  Check procalcitonin, proBNP - Continue antibiotics as above - Dialysis tonight - Supportive care, bronchodilator therapy.  Schedule Solu-Medrol   Acute metabolic encephalopathy -Secondary to combination of febrile illness, hypoxia as above.   ESRD on hemodialysis - MWF hemodialysis - Nephrology consulted from ER, planning for hemodialysis tonight  Hypertension: Hemodialysis tonight, resume home medications once reconciled  History of breast cancer: - Follow-up outpatient  History of multiple myeloma: - Continue follow-up outpatient  Chronic anemia: - Monitor daily CBC  History of  dementia  Goals of care: Discussed goals of care with sister Zelda.  She is DNR/DNI.  She requested continuing aggressive care with no intubation.  In case of decompensation, she would go for comfort care  DVT prophylaxis: Heparin  subcu Code Status: DNR/DNI Family Communication: Spoke with sister Skylin Kennerson  over the phone. Disposition Plan: ICU Consults called: Nephrology Admission status: Inpatient  Severity of Illness: The appropriate patient status for this patient is INPATIENT. Inpatient status is judged to be reasonable and necessary in order to provide the required intensity of service to ensure the patient's safety. The patient's presenting symptoms, physical exam findings, and initial radiographic and laboratory data in the context of their chronic comorbidities is felt to place them at high risk for further clinical deterioration. Furthermore, it is not anticipated that the patient will be medically stable for discharge from the hospital within 2 midnights of admission.   * I certify that at the point of admission it is my clinical judgment that the patient will require inpatient hospital care spanning beyond 2 midnights from the point of admission due to high intensity of service, high risk for further deterioration and high frequency of surveillance required.DEWAINE Derryl Duval MD [facility]  12/23/2023, 3:13 PM

## 2023-12-23 NOTE — Progress Notes (Signed)
 Patient arrived via wheelchair with CNA from [facility].  Severe malaise, coughing up copious amounts of white secretions, associated with SOB and red sclera to include drainage.  Tympanic temp 100.5.  Norman, RN Charge nurse notified that she would be coming to ER for evaluation.  Will cancel treatment for today.  Dr. Davonna aware.

## 2023-12-23 NOTE — Progress Notes (Signed)
 Patient in radiology at 1518 and now in hemodialysis at 1551.        Aida Pizza, RCS

## 2023-12-23 NOTE — Progress Notes (Addendum)
 Pt receives out-pt HD at [facility], mwf, 1100 chair time. Clinic states pt did g to treatment yesterday. Will continue to assists as needed.  [facility] Graden Hoshino  Dialysis Navigator [phone] [place] #- [phone]

## 2023-12-23 NOTE — Progress Notes (Signed)
 BFR reduced d/t alarming Arterial Pressure

## 2023-12-23 NOTE — ED Provider Notes (Signed)
 [place] EMERGENCY DEPARTMENT AT [facility] Provider Note   CSN: [id number] Arrival date & time: 12/23/23  1216     Patient presents with: Fever, Shortness of Breath, Weakness, and Fatigue   Carly Wood is a 88 y.o. female.   88 year old female with a history of ESRD on IHD, multiple myeloma, vascular dementia, and breast cancer who presents emergency department fever, altered mental status, and cough.  History obtained.  The [facility].  They report that the patient and several other residents have had a fever and cough.  Cough is productive.  The patient was also tachycardic.  Was at oncology appointment today and they decided to bring him to the emergency department since she is more drowsy than usual.  Patient denies any pain to me but history is somewhat limited due to her history of vascular dementia and mental status.  Did have her COVID-vaccine yesterday.       Prior to Admission medications   Medication Sig Start Date End Date Taking? Authorizing Provider  acetaminophen  (TYLENOL ) 325 MG tablet Take 650 mg by mouth 3 (three) times daily.   Yes [provider]  acyclovir  (ZOVIRAX ) 200 MG capsule Take 200 mg by mouth in the morning. (0800) 10/20/19  Yes [provider]  anastrozole  (ARIMIDEX ) 1 MG tablet TAKE 1 TABLET BY MOUTH DAILY Patient taking differently: Take 1 mg by mouth daily. 11/05/20  Yes [name], MD  aspirin  EC 81 MG tablet Take 1 tablet (81 mg total) by mouth daily. Swallow whole. 05/28/23  Yes Tat, Alm, MD  atorvastatin  (LIPITOR) 40 MG tablet Take 1 tablet (40 mg total) by mouth daily. Patient taking differently: Take 40 mg by mouth every evening. 05/28/23  Yes Tat, Alm, MD  Calcium  Carb-Cholecalciferol  (CALCIUM  + VITAMIN D3) 500-5 MG-MCG TABS Take 1 tablet by mouth daily.   Yes [provider]  melatonin 5 MG TABS Take 5 mg by mouth at bedtime.   Yes [provider]  multivitamin (RENA-VIT) TABS  tablet Take 1 tablet by mouth at bedtime.   Yes [provider]  ondansetron  (ZOFRAN -ODT) 4 MG disintegrating tablet Take 4 mg by mouth daily before breakfast. 01/01/22  Yes [provider]  pantoprazole  (PROTONIX ) 40 MG tablet Take 40 mg by mouth 2 (two) times daily.   Yes [provider]  sennosides-docusate sodium (SENOKOT-S) 8.6-50 MG tablet Take 1 tablet by mouth 2 (two) times daily.   Yes [provider]  sevelamer  carbonate (RENVELA ) 800 MG tablet Take 800 mg by mouth daily. Patient taking differently: Take 800 mg by mouth 3 (three) times daily with meals. 01/05/23  Yes [provider]  sucralfate  (CARAFATE ) 1 GM/10ML suspension Take 10 mLs by mouth 2 (two) times daily.   Yes [provider]    Allergies: Ace inhibitors, Angiotensin receptor blockers, and Penicillins    Review of Systems  Updated Vital Signs BP (!) 141/83   Pulse 100   Temp 99.4 F (37.4 C) (Axillary)   Resp 19   Ht 5' 3 (1.6 m)   Wt 66.2 kg   SpO2 100%   BMI 25.86 kg/m   Physical Exam Vitals and nursing note reviewed.  Constitutional:      General: She is not in acute distress.    Appearance: She is well-developed. She is ill-appearing.     Comments: Drowsy but is still oriented x 3 and can answer questions  HENT:     Head: Normocephalic and atraumatic.  Right Ear: External ear normal.     Left Ear: External ear normal.     Nose: Nose normal.  Eyes:     Extraocular Movements: Extraocular movements intact.     Conjunctiva/sclera: Conjunctivae normal.     Pupils: Pupils are equal, round, and reactive to light.  Cardiovascular:     Rate and Rhythm: Regular rhythm. Tachycardia present.     Heart sounds: No murmur heard.    Comments: Tunneled catheter in left chest wall.  Site appears clean dry and intact Pulmonary:     Effort: Pulmonary effort is normal. No respiratory distress.     Breath sounds: Rhonchi (Bilaterally) present.  Abdominal:      Palpations: Abdomen is soft.  Musculoskeletal:     Cervical back: Normal range of motion and neck supple.     Right lower leg: Edema (1+) present.     Left lower leg: Edema (1+) present.  Skin:    General: Skin is warm and dry.  Neurological:     Mental Status: Mental status is at baseline.     (all labs ordered are listed, but only abnormal results are displayed) Labs Reviewed  COMPREHENSIVE METABOLIC PANEL WITH GFR - Abnormal; Notable for the following components:      Result Value   Potassium 3.3 (*)    Chloride 94 (*)    Creatinine, Ser 5.39 (*)    Calcium  10.5 (*)    Total Protein 8.4 (*)    GFR, Estimated 7 (*)    Anion gap 17 (*)    All other components within normal limits  CBC WITH DIFFERENTIAL/PLATELET - Abnormal; Notable for the following components:   RBC 3.13 (*)    Hemoglobin 10.7 (*)    HCT 33.7 (*)    MCV 107.7 (*)    MCH 34.2 (*)    Lymphs Abs 0.4 (*)    All other components within normal limits  BLOOD GAS, VENOUS - Abnormal; Notable for the following components:   Bicarbonate 34.0 (*)    Acid-Base Excess 8.3 (*)    All other components within normal limits  CBC - Abnormal; Notable for the following components:   RBC 2.83 (*)    Hemoglobin 9.7 (*)    HCT 30.1 (*)    MCV 106.4 (*)    MCH 34.3 (*)    All other components within normal limits  PRO BRAIN NATRIURETIC PEPTIDE - Abnormal; Notable for the following components:   Pro Brain Natriuretic Peptide >35,000.0 (*)    All other components within normal limits  RESP PANEL BY RT-PCR (RSV, FLU A&B, COVID)  RVPGX2  CULTURE, BLOOD (ROUTINE X 2)  CULTURE, BLOOD (ROUTINE X 2)  EXPECTORATED SPUTUM ASSESSMENT W GRAM STAIN, RFLX TO RESP C  LACTIC ACID, PLASMA  PROTIME-INR  URINALYSIS, W/ REFLEX TO CULTURE (INFECTION SUSPECTED)  HEPATITIS B SURFACE ANTIGEN  HEPATITIS B SURFACE ANTIBODY, QUANTITATIVE  CBG MONITORING, ED    EKG: EKG Interpretation Date/Time:  Thursday December 23 2023 13:05:50  EDT Ventricular Rate:  107 PR Interval:  146 QRS Duration:  84 QT Interval:  316 QTC Calculation: 422 R Axis:   19  Text Interpretation: Sinus tachycardia Probable LVH with secondary repol abnrm Confirmed by Yolande Charleston [phone]) on 12/23/2023 1:42:27 PM  Radiology: CT Chest Wo Contrast Addendum Date: 12/23/2023 ADDENDUM REPORT: 12/23/2023 16:10 ADDENDUM: These results were called by telephone at the time of interpretation on 12/23/2023 at 4:08 pm to provider Casimir Yolande , who verbally acknowledged these results.  Electronically Signed   By: Juliene Balder M.D.   On: 12/23/2023 16:10   Result Date: 12/23/2023 CLINICAL DATA:  Possible pneumonia.  Respiratory failure. EXAM: CT CHEST WITHOUT CONTRAST TECHNIQUE: Multidetector CT imaging of the chest was performed following the standard protocol without IV contrast. RADIATION DOSE REDUCTION: This exam was performed according to the departmental dose-optimization program which includes automated exposure control, adjustment of the mA and/or kV according to patient size and/or use of iterative reconstruction technique. COMPARISON:  10/01/2021 FINDINGS: Cardiovascular: Diffuse coronary artery calcifications. Left jugular dialysis catheter with the tip near the superior cavoatrial junction. Heart size is normal without significant pericardial effusion. Atherosclerotic calcifications in thoracic aorta. Atherosclerotic calcifications in the abdominal aorta. Mediastinum/Nodes: No significant lymph node enlargement in the mediastinum. Limited evaluation of the hilar regions without intravascular contrast. No significant axillary lymph node enlargement. Prominent venous structures in the neck. Limited evaluation for supraclavicular lymph node enlargement. Lungs/Pleura: No pleural effusions. Trachea and mainstem bronchi are patent. Volume loss in both lower lobes. No focal airspace disease. Upper Abdomen: Kidneys appear to be atrophic. Evidence for bilateral  renal cysts. Extensive vascular calcifications involving the both kidneys. Probable stone in the left kidney lower pole. No acute abnormality in the visualized abdomen. Musculoskeletal: Severe subcutaneous edema throughout the left lower chest and left breast tissue. The subcutaneous edema appears to be centered around the left breast tissue. Prominent soft tissue along the surface of the left breast. Neoplastic process in left breast cannot be excluded. There are old vertebral body compression deformities involving T12, L1 and L2. IMPRESSION: 1. Extensive subcutaneous edema along the left side of the chest and left breast. Soft tissue fullness involving the left breast tissue. Neoplastic process cannot be excluded. Recommend clinical correlation in this area and consider mammography. 2. Mild volume loss at the lung bases. Otherwise, no acute lung abnormalities. 3. Dialysis catheter. 4.  Aortic Atherosclerosis (ICD10-I70.0). These results will be called to the ordering clinician or representative by the Radiologist Assistant, and communication documented in the PACS or [employer]. Electronically Signed: By: Juliene Balder M.D. On: 12/23/2023 15:56   DG Chest Portable 1 View Result Date: 12/23/2023 CLINICAL DATA:  ams, cough, fever EXAM: PORTABLE CHEST - 1 VIEW COMPARISON:  03/16/2023 FINDINGS: Left IJ approach hemodialysis catheter in place terminating in the high right atrium, unchanged. Low lung volumes. No focal airspace consolidation, pleural effusion, or pneumothorax. Moderate cardiomegaly. Tortuous aorta with aortic atherosclerosis. No acute fracture or destructive lesions. Multilevel thoracic osteophytosis. Surgical clips in the right breast/axilla. IMPRESSION: Low lung volumes.  Otherwise, no acute cardiopulmonary abnormality. Electronically Signed   By: Rogelia Myers M.D.   On: 12/23/2023 13:47     Procedures   Medications Ordered in the ED  Chlorhexidine  Gluconate Cloth 2 % PADS 6 each (has  no administration in time range)  pentafluoroprop-tetrafluoroeth (GEBAUERS) aerosol 1 Application (has no administration in time range)  lidocaine  (PF) (XYLOCAINE ) 1 % injection 5 mL (has no administration in time range)  lidocaine -prilocaine  (EMLA ) cream 1 Application (has no administration in time range)  heparin  injection 1,000 Units (has no administration in time range)  anticoagulant sodium citrate  solution 5 mL (has no administration in time range)  alteplase  (CATHFLO ACTIVASE ) injection 2 mg (has no administration in time range)  heparin  injection 5,000 Units (5,000 Units Subcutaneous Not Given 12/23/23 1539)  acetaminophen  (TYLENOL ) tablet 650 mg (has no administration in time range)    Or  acetaminophen  (TYLENOL ) suppository 650 mg (has no administration in  time range)  senna-docusate (Senokot-S) tablet 1 tablet (has no administration in time range)  albuterol  (PROVENTIL ) (2.5 MG/3ML) 0.083% nebulizer solution 2.5 mg (has no administration in time range)  hydrALAZINE  (APRESOLINE ) injection 10 mg (has no administration in time range)  vancomycin  (VANCOREADY) IVPB 1500 mg/300 mL (has no administration in time range)  vancomycin  (VANCOREADY) IVPB 750 mg/150 mL (has no administration in time range)  ceFEPIme  (MAXIPIME ) 2 g in sodium chloride  0.9 % 100 mL IVPB (has no administration in time range)  ceFEPIme  (MAXIPIME ) 2 g in sodium chloride  0.9 % 100 mL IVPB (has no administration in time range)  methylPREDNISolone  sodium succinate (SOLU-MEDROL ) 40 mg/mL injection 40 mg (has no administration in time range)  aspirin  EC tablet 81 mg (has no administration in time range)  acyclovir  (ZOVIRAX ) 200 MG capsule 200 mg (has no administration in time range)  atorvastatin  (LIPITOR) tablet 40 mg (has no administration in time range)  pantoprazole  (PROTONIX ) EC tablet 40 mg (has no administration in time range)  sucralfate  (CARAFATE ) 1 GM/10ML suspension 1 g (has no administration in time range)   cefTRIAXone (ROCEPHIN) 2 g in sodium chloride  0.9 % 100 mL IVPB (0 g Intravenous Stopped 12/23/23 1334)  doxycycline  (VIBRAMYCIN ) 100 mg in sodium chloride  0.9 % 250 mL IVPB ( Intravenous Infusion Verify 12/23/23 1427)  acetaminophen  (TYLENOL ) suppository 650 mg (650 mg Rectal Given 12/23/23 1311)    Clinical Course as of 12/23/23 1622  Thu Dec 23, 2023  1243 Attempted to contact the [facility].  Unfortunately there was not a nurse available to talk about the patient who is familiar with her case. [RP]  1304 Discussed with Carly Wood from the cancer center. Was coughing profusely when she was there. Had a temp of 103 F and she had an increased RR. Wanted to hold off on her treatments for today.  [RP]  1341 Carly Wood sister contacted regarding the patient's critical illness.  [RP]  1418 Dw Dr Jerrye from nephrology consulted for dialysis today.  [RP]  1425 Dr Mcarthur from hospitalist consulted for admission.  [RP]    Clinical Course User Index [RP] Yolande Lamar BROCKS, MD                                 Medical Decision Making Amount and/or Complexity of Data Reviewed Labs: ordered. Radiology: ordered.  Risk OTC drugs. Decision regarding hospitalization.   TAWAN CORKERN is a 88 year old female with a history of ESRD on IHD, multiple myeloma, vascular dementia, and breast cancer who presents emergency department fever, altered mental status, and cough.   Initial Ddx:  Sepsis, pneumonia, respiratory failure, volume overload, CHF, PE  MDM/Course:  Patient presents emergency department with fever, altered mental status, and a cough.  Did receive a COVID vaccine yesterday.  Is quite ill-appearing on exam.  Initially had normal work of breathing but during her stay in the emergency department upon re-evaluation work of breathing had worsened.  Did require BiPAP.  She is DNR/DNI.  Did confirm this with the patient and her sister.  Did express to the sister that I am very concerned  about how sick she is at this point in time and that she could pass away in the next day or 2.  Did amend that any family members who would like to be with her, visit her at the hospital soon as possible.  She was given Tylenol  suppository since she is  not tolerating p.o.  Started on antibiotics for suspected pneumonia.  Chest x-ray did not show evidence of lobar pneumonia discussed with hospitalist and CT of the chest was ordered to eval for an occult pneumonia.  Nephrology consulted for dialysis as well.  Suspect that she likely has respiratory failure from a URI  This patient presents to the ED for concern of complaints listed in HPI, this involves an extensive number of treatment options, and is a complaint that carries with it a high risk of complications and morbidity. Disposition including potential need for admission considered.   Dispo: ICU  Additional history obtained from sister Records reviewed ED Visit Notes The following labs were independently interpreted: VBG and show no acute abnormality I independently reviewed the following imaging with scope of interpretation limited to determining acute life threatening conditions related to emergency care: Chest x-ray and agree with the radiologist interpretation with the following exceptions: none I personally reviewed and interpreted cardiac monitoring: sinus tachycardia I personally reviewed and interpreted the pt's EKG: see above for interpretation  I have reviewed the patients home medications and made adjustments as needed Consults: Hospitalist Social Determinants of health:  Geriatric  CRITICAL CARE Performed by: Lamar JAYSON Shan   Total critical care time: 30 minutes  Critical care time was exclusive of separately billable procedures and treating other patients.  Critical care was necessary to treat or prevent imminent or life-threatening deterioration.  Critical care was time spent personally by me on the following  activities: development of treatment plan with patient and/or surrogate as well as nursing, discussions with consultants, evaluation of patient's response to treatment, examination of patient, obtaining history from patient or surrogate, ordering and performing treatments and interventions, ordering and review of laboratory studies, ordering and review of radiographic studies, pulse oximetry and re-evaluation of patient's condition.   Portions of this note were generated with Scientist, clinical (histocompatibility and immunogenetics). Dictation errors may occur despite best attempts at proofreading.     Final diagnoses:  Upper respiratory tract infection, unspecified type  Sepsis, due to unspecified organism, unspecified whether acute organ dysfunction present [facility])  Acute respiratory failure, unspecified whether with hypoxia or hypercapnia [facility])    ED Discharge Orders     None          Shan Lamar JAYSON, MD 12/23/23 1622

## 2023-12-23 NOTE — ED Notes (Signed)
 Pt taken to CT with RN and RT present

## 2023-12-23 NOTE — Progress Notes (Signed)
 Pharmacy Antibiotic Note  Carly Wood is a 88 y.o. female admitted on 12/23/2023 with cough and shortness of breath.  Pharmacy has been consulted for vancomycin  and cefepime  dosing.  Patient currently febrile to 102.3, white count is normal. Patient is noted to be ESRD MWF with last session yesterday 10/15.   Plan: Vancomycin  1500x1 then 750 mg after each HD session.  Goal trough 15-20 mcg/mL. Cefepime  2g x 1 now then 2g after each HD session Follow up HD schedule Check levels as indicated  Height: 5' 3 (160 cm) Weight: 66.2 kg (146 lb) IBW/kg (Calculated) : 52.4  Temp (24hrs), Avg:101.2 F (38.4 C), Min:100.3 F (37.9 C), Max:102.3 F (39.1 C)  Recent Labs  Lab 12/23/23 1250  WBC 8.2  CREATININE 5.39*  LATICACIDVEN 1.1    Estimated Creatinine Clearance: 7 mL/min (A) (by C-G formula based on SCr of 5.39 mg/dL (H)).    Allergies  Allergen Reactions   Ace Inhibitors Other (See Comments) and Cough    Tongue swelling Angioedema    Angiotensin Receptor Blockers Other (See Comments)    Angioedema with ACE-I   Penicillins Other (See Comments)    Unknown reaction    Antimicrobials this admission: Vancomycin  10/16 >>  Cefepime  10/16 >>  CRO/Doxy x1 10/16  Microbiology results: 10/16 BCx: sent  Thank you for allowing pharmacy to be a part of this patient's care.  Dempsey Blush PharmD., BCPS Clinical Pharmacist 12/23/2023 3:02 PM

## 2023-12-23 NOTE — ED Triage Notes (Signed)
 Pt came in by EMS [facility] from [facility]. Pt had COVID shot yesterday and today appeared altered with temp of 102.4, Pulse 113, RR 24, BP 160/79, O2 94% RA. Pt appears to be fatigued with generalized weakness.

## 2023-12-24 ENCOUNTER — Inpatient Hospital Stay (HOSPITAL_COMMUNITY)

## 2023-12-24 ENCOUNTER — Other Ambulatory Visit: Payer: Self-pay

## 2023-12-24 DIAGNOSIS — N186 End stage renal disease: Secondary | ICD-10-CM

## 2023-12-24 DIAGNOSIS — R0609 Other forms of dyspnea: Secondary | ICD-10-CM

## 2023-12-24 DIAGNOSIS — C50911 Malignant neoplasm of unspecified site of right female breast: Secondary | ICD-10-CM

## 2023-12-24 DIAGNOSIS — I10 Essential (primary) hypertension: Secondary | ICD-10-CM

## 2023-12-24 DIAGNOSIS — Z992 Dependence on renal dialysis: Secondary | ICD-10-CM | POA: Diagnosis not present

## 2023-12-24 DIAGNOSIS — I5033 Acute on chronic diastolic (congestive) heart failure: Secondary | ICD-10-CM

## 2023-12-24 DIAGNOSIS — J9601 Acute respiratory failure with hypoxia: Secondary | ICD-10-CM | POA: Diagnosis not present

## 2023-12-24 LAB — ECHOCARDIOGRAM COMPLETE
AR max vel: 0.84 cm2
AV Area VTI: 0.78 cm2
AV Area mean vel: 0.77 cm2
AV Mean grad: 15 mmHg
AV Peak grad: 25.9 mmHg
Ao pk vel: 2.55 m/s
Area-P 1/2: 4.49 cm2
Height: 63 in
MV VTI: 1.25 cm2
S' Lateral: 2.9 cm
Weight: 2336 [oz_av]

## 2023-12-24 LAB — HEPATITIS B SURFACE ANTIGEN: Hepatitis B Surface Ag: NONREACTIVE

## 2023-12-24 MED ORDER — CHLORHEXIDINE GLUCONATE CLOTH 2 % EX PADS: 6.0000 | MEDICATED_PAD | Freq: Every day | CUTANEOUS | Status: DC

## 2023-12-24 MED ORDER — IPRATROPIUM-ALBUTEROL 0.5-2.5 (3) MG/3ML IN SOLN
3.0000 mL | Freq: Three times a day (TID) | RESPIRATORY_TRACT | Status: DC
  Administered 2023-12-24: 3 mL via RESPIRATORY_TRACT
  Filled 2023-12-24: qty 3

## 2023-12-24 MED ORDER — BUDESONIDE 0.5 MG/2ML IN SUSP
0.5000 mg | Freq: Two times a day (BID) | RESPIRATORY_TRACT | Status: DC
  Administered 2023-12-24 – 2023-12-27 (×6): 0.5 mg via RESPIRATORY_TRACT
  Filled 2023-12-24 (×6): qty 2

## 2023-12-24 MED ORDER — SODIUM CHLORIDE 0.9 % IV SOLN
2.0000 g | INTRAVENOUS | Status: AC
  Administered 2023-12-25: 2 g via INTRAVENOUS
  Filled 2023-12-24: qty 12.5

## 2023-12-24 MED ORDER — IPRATROPIUM-ALBUTEROL 0.5-2.5 (3) MG/3ML IN SOLN
3.0000 mL | Freq: Three times a day (TID) | RESPIRATORY_TRACT | Status: DC
  Administered 2023-12-25 – 2023-12-27 (×8): 3 mL via RESPIRATORY_TRACT
  Filled 2023-12-24 (×8): qty 3

## 2023-12-24 MED ORDER — SODIUM CHLORIDE 0.9 % IV SOLN: 2.0000 g | INTRAVENOUS | Status: DC

## 2023-12-24 MED ORDER — DM-GUAIFENESIN ER 30-600 MG PO TB12
1.0000 | ORAL_TABLET | Freq: Two times a day (BID) | ORAL | Status: DC
  Administered 2023-12-24 – 2023-12-27 (×6): 1 via ORAL
  Filled 2023-12-24 (×6): qty 1

## 2023-12-24 NOTE — Consult Note (Signed)
 [facility] Renal Consultation Note  Requesting MD: Eric Nunnery, MD  Indication for Consultation:  ESRD  Chief complaint: fever and cough  HPI:  Carly Wood is a 88 y.o. female with a history of end-stage renal disease on hemodialysis MWF, dementia, hypertension, and multiple myeloma who presented to the hospital from outpatient hem/onc with fever and productive cough.  She had seen hem/onc RN at the [facility] and they made the recommendation to send her to the ER and to hold her treatment planned for that day.   She was initially initiated on nasal cannula and then this was transitioned to BiPAP due to hypoxia.  COVID influenza A and B and RSV were all negative.  Nephrology was consulted for assistance with management.  We elected for hemodialysis on 10/16 given her hypoxia.  She had 2.7 kg UF with HD on 10/16 here.  Note that she received a COVID-vaccine the day prior to presentation, inferred as 10/15.  She asks me twice why she is here.  She denies any shortness of breath currently and states that she doesn't    Called her outpatient HD unit as below.  Last post weight was 66.5 kg on 10/15 (and her EDW just lowered to 66.5 after she met that weight on Wed).    Creatinine, Ser  Date/Time Value Ref Range Status  12/23/2023 12:50 PM 5.39 (H) 0.44 - 1.00 mg/dL Final  89/90/7974 88:81 AM 5.45 (H) 0.44 - 1.00 mg/dL Final  89/97/7974 88:89 AM 5.59 (H) 0.44 - 1.00 mg/dL Final  90/74/7974 88:96 AM 5.49 (H) 0.44 - 1.00 mg/dL Final  90/81/7974 87:96 PM 5.47 (H) 0.44 - 1.00 mg/dL Final  90/88/7974 87:97 PM 5.59 (H) 0.44 - 1.00 mg/dL Final  90/95/7974 89:71 AM 5.47 (H) 0.44 - 1.00 mg/dL Final  91/71/7974 87:94 PM 5.28 (H) 0.44 - 1.00 mg/dL Final  91/78/7974 90:42 AM 5.06 (H) 0.44 - 1.00 mg/dL Final  91/85/7974 87:76 PM 5.07 (H) 0.44 - 1.00 mg/dL Final  91/92/7974 90:69 AM 5.17 (H) 0.44 - 1.00 mg/dL Final  92/68/7974 87:45 PM 5.18 (H) 0.44 - 1.00 mg/dL Final   92/75/7974 90:51 AM 4.95 (H) 0.44 - 1.00 mg/dL Final  92/82/7974 88:88 AM 5.45 (H) 0.44 - 1.00 mg/dL Final  92/89/7974 98:71 PM 5.82 (H) 0.44 - 1.00 mg/dL Final  92/96/7974 98:92 PM 5.79 (H) 0.44 - 1.00 mg/dL Final  93/73/7974 87:95 PM 5.65 (H) 0.44 - 1.00 mg/dL Final  93/80/7974 88:48 AM 5.78 (H) 0.44 - 1.00 mg/dL Final  93/87/7974 87:50 PM 6.36 (H) 0.44 - 1.00 mg/dL Final  93/94/7974 89:68 AM 7.23 (H) 0.44 - 1.00 mg/dL Final  94/70/7974 87:94 PM 6.17 (H) 0.44 - 1.00 mg/dL Final  94/77/7974 87:45 PM 6.16 (H) 0.44 - 1.00 mg/dL Final  94/84/7974 98:87 PM 6.59 (H) 0.44 - 1.00 mg/dL Final  94/91/7974 98:89 PM 9.68 (H) 0.44 - 1.00 mg/dL Final  94/98/7974 87:57 PM 6.14 (H) 0.44 - 1.00 mg/dL Final  95/75/7974 98:88 PM 6.07 (H) 0.44 - 1.00 mg/dL Final  95/82/7974 89:74 AM 5.52 (H) 0.44 - 1.00 mg/dL Final  95/91/7974 88:47 AM 6.20 (H) 0.44 - 1.00 mg/dL Final  95/98/7974 87:86 PM 6.42 (H) 0.44 - 1.00 mg/dL Final  96/74/7974 98:51 PM 6.63 (H) 0.44 - 1.00 mg/dL Final  96/79/7974 95:55 AM 7.39 (H) 0.44 - 1.00 mg/dL Final  96/80/7974 94:75 AM 11.95 (H) 0.44 - 1.00 mg/dL Final  96/81/7974 95:62 AM 10.62 (H) 0.44 - 1.00 mg/dL Final  05/24/2023 01:52 PM 9.50 (H) 0.44 - 1.00 mg/dL Final  96/88/7974 98:86 PM 6.77 (H) 0.44 - 1.00 mg/dL Final  96/95/7974 98:57 PM 6.90 (H) 0.44 - 1.00 mg/dL Final  97/74/7974 87:63 PM 6.83 (H) 0.44 - 1.00 mg/dL Final  97/81/7974 87:96 PM 6.46 (H) 0.44 - 1.00 mg/dL Final  97/88/7974 89:41 AM 6.51 (H) 0.44 - 1.00 mg/dL Final  97/95/7974 87:70 PM 7.03 (H) 0.44 - 1.00 mg/dL Final  98/78/7974 87:51 PM 6.78 (H) 0.44 - 1.00 mg/dL Final  98/92/7974 97:58 PM 6.30 (H) 0.44 - 1.00 mg/dL Final  98/92/7974 89:47 AM 6.12 (H) 0.44 - 1.00 mg/dL Final  87/75/7975 91:95 AM 6.51 (H) 0.44 - 1.00 mg/dL Final  87/89/7975 88:89 AM 8.17 (H) 0.44 - 1.00 mg/dL Final  88/73/7975 88:87 AM 6.28 (H) 0.44 - 1.00 mg/dL Final  88/87/7975 98:74 PM 6.57 (H) 0.44 - 1.00 mg/dL Final  89/70/7975 88:42 AM  6.77 (H) 0.44 - 1.00 mg/dL Final  89/84/7975 95:94 AM 12.02 (H) 0.44 - 1.00 mg/dL Final  89/85/7975 96:60 AM 9.97 (H) 0.44 - 1.00 mg/dL Final  89/86/7975 94:73 AM 8.00 (H) 0.44 - 1.00 mg/dL Final  89/87/7975 95:43 AM 5.75 (H) 0.44 - 1.00 mg/dL Final     PMHx:   Past Medical History:  Diagnosis Date   Anemia     chronic macrocytic anemia   Anxiety    Chronic kidney disease    Chronic renal disease, stage 4, severely decreased glomerular filtration rate (GFR) between 15-29 mL/min/1.73 square meter (HCC) 08/22/2015   Complication of anesthesia    delirious after Breast Surgery   Dementia (HCC)    mild   Depression    Diabetes mellitus with ESRD (end-stage renal disease) (HCC)    type II   Dysphagia    Dyspnea    with activity   GERD (gastroesophageal reflux disease)    Glaucoma    Hyperlipidemia    Hypertension    Multiple myeloma (HCC)    Pneumonia    Stage 1 infiltrating ductal carcinoma of right female breast (HCC) 08/21/2015   ER+ PR+ HER 2 neu + (3+) T1cN0     Past Surgical History:  Procedure Laterality Date   A/V FISTULAGRAM Right 07/05/2020   Procedure: A/V Fistulagram;  Surgeon: Harvey Carlin BRAVO, MD;  [facility];  Service: Cardiovascular;  Laterality: Right;   AV FISTULA PLACEMENT Left 11/22/2017   Procedure: ARTERIOVENOUS (AV) FISTULA CREATION LEFT ARM;  Surgeon: Harvey Carlin BRAVO, MD;  Location: [facility] OR;  Service: Vascular;  Laterality: Left;   AV FISTULA PLACEMENT Right 04/04/2020   Procedure: RIGHT ARM ARTERIOVENOUS FISTULA CREATION;  Surgeon: Oris Krystal FALCON, MD;  Location: [facility];  Service: Vascular;  Laterality: Right;   AV FISTULA PLACEMENT Right 08/20/2020   Procedure: ARTERIOVENOUS (AV) FISTULA LIGATION RIGHT ARM;  Surgeon: Harvey Carlin BRAVO, MD;  Location: [facility] OR;  Service: Vascular;  Laterality: Right;   BIOPSY  08/07/2016   Procedure: BIOPSY;  Surgeon: Shaaron [name], MD;  Location: [facility];  Service: Endoscopy;;  gastric ulcer  biopsy   COLONOSCOPY     ESOPHAGOGASTRODUODENOSCOPY N/A 08/07/2016   LA Grade A esophagitis s/p dilation, small hiatal hernia, multiple gastric ulcers and erosions, duodenal erosions s/p biopsy. Negative H.pylori    ESOPHAGOGASTRODUODENOSCOPY N/A 11/27/2016   normal esophagus, previously noted gastric ulcers completely healed, normal duodenum.    ESOPHAGOGASTRODUODENOSCOPY (EGD) WITH PROPOFOL  N/A 07/23/2020   Procedure: ESOPHAGOGASTRODUODENOSCOPY (EGD) WITH PROPOFOL ;  Surgeon: Golda Claudis PENNER, MD;  [facility];  Service: Endoscopy;  Laterality: N/A;   FISTULA SUPERFICIALIZATION Left 02/14/2018   Procedure: FISTULA SUPERFICIALIZATION LEFT ARM;  Surgeon: Eliza Lonni RAMAN, MD;  Location: [facility] OR;  Service: Vascular;  Laterality: Left;   FRACTURE SURGERY Right    ankle   HOT HEMOSTASIS  07/23/2020   Procedure: HOT HEMOSTASIS (ARGON PLASMA COAGULATION/BICAP);  Surgeon: Golda Claudis PENNER, MD;  [facility];  Service: Endoscopy;;   INTRAMEDULLARY (IM) NAIL INTERTROCHANTERIC Right 07/12/2020   Procedure: INTRAMEDULLARY (IM) NAIL INTERTROCHANTRIC;  Surgeon: Onesimo Oneil LABOR, MD;  Location: [facility];  Service: Orthopedics;  Laterality: Right;   IR FLUORO GUIDE CV LINE LEFT  12/21/2022   IR US  GUIDE VASC ACCESS LEFT  12/21/2022   MALONEY DILATION N/A 08/07/2016   Procedure: AGAPITO DILATION;  Surgeon: Shaaron [name], MD;  [facility];  Service: Endoscopy;  Laterality: N/A;   MASTECTOMY, PARTIAL Right    PERIPHERAL VASCULAR BALLOON ANGIOPLASTY Left 07/13/2019   Procedure: PERIPHERAL VASCULAR BALLOON ANGIOPLASTY;  Surgeon: Gretta Lonni PARAS, MD;  [facility];  Service: Cardiovascular;  Laterality: Left;  arm fistulogram   PERIPHERAL VASCULAR BALLOON ANGIOPLASTY Right 05/22/2020   Procedure: PERIPHERAL VASCULAR BALLOON ANGIOPLASTY;  Surgeon: Magda Debby SAILOR, MD;  [facility];  Service: Cardiovascular;  Laterality: Right;  arm fistula    PERIPHERAL VASCULAR BALLOON ANGIOPLASTY Right 07/05/2020   Procedure: PERIPHERAL VASCULAR BALLOON ANGIOPLASTY;  Surgeon: Harvey Carlin BRAVO, MD;  [facility];  Service: Cardiovascular;  Laterality: Right;  arm fistula   PORT-A-CATH REMOVAL Left 11/22/2017   Procedure: REMOVAL PORT-A-CATH LEFT CHEST;  Surgeon: Harvey Carlin BRAVO, MD;  Location: [facility] OR;  Service: Vascular;  Laterality: Left;   RETINAL DETACHMENT SURGERY Right    SCLEROTHERAPY  07/23/2020   Procedure: SCLEROTHERAPY;  Surgeon: Golda Claudis PENNER, MD;  [facility];  Service: Endoscopy;;   TUNNELLED CATHETER EXCHANGE N/A 08/16/2023   Procedure: TUNNELLED CATHETER EXCHANGE;  Surgeon: Magda Debby SAILOR, MD;  Location: [facility];  Service: Cardiovascular;  Laterality: N/A;   UPPER EXTREMITY VENOGRAPHY N/A 08/22/2020   Procedure: LEFT UPPER & CENTRAL VENOGRAPHY;  Surgeon: Gretta Lonni PARAS, MD;  [facility];  Service: Cardiovascular;  Laterality: N/A;    Family Hx:  Family History  Problem Relation Age of Onset   Multiple myeloma Sister    Brain cancer Sister    Dementia Mother        died at 30   Stroke Mother    Heart failure Mother    Diabetes Mother    Heart disease Father    Prostate cancer Brother    Colon cancer Neg Hx     Social History:  reports that she has never smoked. She has never used smokeless tobacco. She reports that she does not drink alcohol  and does not use drugs.  Allergies:  Allergies  Allergen Reactions   Ace Inhibitors Other (See Comments) and Cough    Tongue swelling Angioedema    Angiotensin Receptor Blockers Other (See Comments)    Angioedema with ACE-I   Penicillins Other (See Comments)    Unknown reaction    Medications: Prior to Admission medications   Medication Sig Start Date End Date Taking? Authorizing Provider  acetaminophen  (TYLENOL ) 325 MG tablet Take 650 mg by mouth 3 (three) times daily.   Yes [provider]  acyclovir   (ZOVIRAX ) 200 MG capsule Take 200 mg by mouth in the  morning. (0800) 10/20/19  Yes [provider]  anastrozole  (ARIMIDEX ) 1 MG tablet TAKE 1 TABLET BY MOUTH DAILY Patient taking differently: Take 1 mg by mouth daily. 11/05/20  Yes [name], MD  aspirin  EC 81 MG tablet Take 1 tablet (81 mg total) by mouth daily. Swallow whole. 05/28/23  Yes Tat, Alm, MD  atorvastatin  (LIPITOR) 40 MG tablet Take 1 tablet (40 mg total) by mouth daily. Patient taking differently: Take 40 mg by mouth every evening. 05/28/23  Yes Tat, Alm, MD  Calcium  Carb-Cholecalciferol  (CALCIUM  + VITAMIN D3) 500-5 MG-MCG TABS Take 1 tablet by mouth daily.   Yes [provider]  melatonin 5 MG TABS Take 5 mg by mouth at bedtime.   Yes [provider]  multivitamin (RENA-VIT) TABS tablet Take 1 tablet by mouth at bedtime.   Yes [provider]  ondansetron  (ZOFRAN -ODT) 4 MG disintegrating tablet Take 4 mg by mouth daily before breakfast. 01/01/22  Yes [provider]  pantoprazole  (PROTONIX ) 40 MG tablet Take 40 mg by mouth 2 (two) times daily.   Yes [provider]  sennosides-docusate sodium (SENOKOT-S) 8.6-50 MG tablet Take 1 tablet by mouth 2 (two) times daily.   Yes [provider]  sevelamer  carbonate (RENVELA ) 800 MG tablet Take 800 mg by mouth daily. Patient taking differently: Take 800 mg by mouth 3 (three) times daily with meals. 01/05/23  Yes [provider]  sucralfate  (CARAFATE ) 1 GM/10ML suspension Take 10 mLs by mouth 2 (two) times daily.   Yes [provider]   I have reviewed the patient's current and reported prior to admission medications.   Labs:     Latest Ref Rng & Units 12/23/2023   12:50 PM 12/16/2023   11:18 AM 12/09/2023   11:10 AM  BMP  Glucose 70 - 99 mg/dL 91  855  842   BUN 8 - 23 mg/dL 18  18  23    Creatinine 0.44 - 1.00 mg/dL 4.60  4.54  4.40   Sodium 135 - 145 mmol/L 140  141  137   Potassium 3.5 - 5.1  mmol/L 3.3  3.7  3.4   Chloride 98 - 111 mmol/L 94  97  95   CO2 22 - 32 mmol/L 30  28  26    Calcium  8.9 - 10.3 mg/dL 89.4  89.7  9.8      ROS:  Pertinent items noted in HPI and remainder of comprehensive ROS otherwise negative.  Physical Exam: Vitals:   12/24/23 1040 12/24/23 1115  BP:    Pulse:    Resp:    Temp:  97.9 F (36.6 C)  SpO2: 98%      General:   elderly female in bed in NAD HEENT: NCAT Eyes: EOMI sclera anicteric Neck: supple trachea midline Heart: S1S2 no rub Lungs: clear to auscultation bilaterally; normal work of breathing at rest; 5 liters oxygen  Abdomen: soft/NT/ND Extremities: no pitting edema appreciated; no cyanosis or clubbing  Skin: no rash on extremities exposed Neuro: alert and oriented x 3 provides hx and follows commands  Psych no anxiety or agitation  Access left chest tunneled dialysis catheter    Outpatient HD orders: [facility] MWF EDW 66.5 kg  3 hrs 45 minutes BF 350 ml/min 500 ml/min Tunneled catheter for HD Bath: 1K/2.5 Ca  Meds:  Calcitriol  1.5 mcg every tx Hourly heparin  at 1000 units/hr and a loading dose of 1000 units/hr Mircera 50 mcg every 2 weeks - just got on Monday  12/20/23 Venofer 50 mg weekly  Last post weight - 66.5 kg (EDW just lowered to 66.5 after she met that weight on Wed)   Assessment/Plan:   # Fever and Cough  - Concerning for possible pneumonia  - abx per primary team    # Acute hypoxic respiratory failure  - optimize volume status with HD  - supplemental oxygen  - Subcutaneous edema on left side of chest and left breast and per radiology neoplastic process cannot be excluded   # ESRD  - usually gets HD on MWF schedule - HD on 10/16 off schedule - Plan for HD on 10/18 off schedule, Sat and then next week can transition back to MWF schedule  - note she's on a 1K bath outpatient - will order a 2K bath here and I would suggest changing to 2K bath outpatient if her trends here continue  # Anemia  of CKD  - note recently rec'd ESA   # Metabolic bone disease - on calcitriol  usually - will hold given her calcium   - obtain phos in AM   # Multiple myeloma  - Per primary team and outpatient hem/onc  # Dementia - Charted diagnosis  - Appreciate staff supplementing her history   Disposition - per primary team.  Nephrology will not physically see the patient over the weekend as of right now but will review labs and chart.  Please do not hesitate to reach out to nephrology on call with any questions or if her clinical status changes    Katheryn JAYSON Saba 12/24/2023, 12:17 PM

## 2023-12-24 NOTE — Plan of Care (Signed)

## 2023-12-24 NOTE — NC FL2 (Signed)
 [place]  MEDICAID FL2 LEVEL OF CARE FORM     IDENTIFICATION  Patient Name: Carly Wood Birthdate: 1935/05/13 Sex: female Admission Date (Current Location): 12/23/2023  [facility] and [place] Number:  [employer] and Address:  [facility],  [address], [place] [address]      Provider Number: [id number]  Attending Physician Name and Address:  Ricky Fines, MD  Relative Name and Phone Number:  Sheral, Pfahler (Sister)  [phone]    Current Level of Care: Hospital Recommended Level of Care: Nursing Facility Prior Approval Number:    Date Approved/Denied:   PASRR Number: [id number]  Discharge Plan: SNF    Current Diagnoses: Patient Active Problem List   Diagnosis Date Noted   Acute hypoxic respiratory failure (HCC) 12/23/2023   Normocytic anemia 11/11/2023   DM (diabetes mellitus), secondary, with peripheral vascular complications (HCC) 10/19/2023   Right knee pain 05/24/2023   Elevated troponin 05/24/2023   Aortic valve stenosis 05/24/2023   Prolonged Q-T interval on ECG 05/24/2023   Essential hypertension 05/04/2023   Complication associated with dialysis catheter 12/18/2022   Chronic generalized pain 08/21/2022   Numbness and tingling in both hands 07/16/2022   Post-menopausal osteoporosis 12/11/2021   Elevated liver enzymes 10/01/2021   Breast cancer (HCC) 10/01/2021   Calculus of gallbladder without cholecystitis without obstruction    History of hip fracture 07/03/2021   Edema 02/06/2021   Type 2 diabetes mellitus with hypertension and end stage renal disease on dialysis (HCC) 12/19/2020   Protein-calorie malnutrition, severe 07/31/2020   Duodenal ulcer    Acute upper GI bleed 07/22/2020   Rectal bleeding 07/22/2020   Major depression, recurrent, chronic 07/18/2020   ESRD on hemodialysis (HCC) 07/13/2020   Hyperlipidemia associated with type 2 diabetes mellitus (HCC) 06/19/2020   Aortic atherosclerosis 04/01/2020    Atherosclerotic peripheral vascular disease 04/01/2020   Hyperkalemia 12/25/2019   Dependence on renal dialysis 10/25/2019   Chronic kidney disease with end stage renal disease on dialysis due to type 2 diabetes mellitus (HCC) 10/19/2019   Hypertension due to end stage renal disease caused by type 2 diabetes mellitus, on dialysis (HCC) 10/19/2019   ESRD (end stage renal disease) (HCC) 10/19/2019   Herpes 10/19/2019   Vascular dementia with behavior disturbance (HCC) 10/19/2019   Diabetes mellitus with ESRD (end-stage renal disease) (HCC) 10/12/2019   Multiple myeloma (HCC) 10/04/2018   MGUS (monoclonal gammopathy of unknown significance) 09/20/2018   Iron  deficiency anemia 05/03/2017   Multiple gastric ulcers 11/10/2016   Dysphagia 07/31/2016   GERD (gastroesophageal reflux disease) 07/31/2016   Lymphedema of arm 08/22/2015   Stage 1 infiltrating ductal carcinoma of right female breast (HCC) 08/21/2015    Orientation RESPIRATION BLADDER Height & Weight     Self, Time  Normal Incontinent, External catheter Weight:  (unable to wt) Height:  5' 3 (160 cm)  BEHAVIORAL SYMPTOMS/MOOD NEUROLOGICAL BOWEL NUTRITION STATUS      Continent Diet (See DC Summary)  AMBULATORY STATUS COMMUNICATION OF NEEDS Skin   Extensive Assist Verbally Normal                       Personal Care Assistance Level of Assistance  Bathing, Feeding, Dressing Bathing Assistance: Maximum assistance Feeding assistance: Limited assistance Dressing Assistance: Maximum assistance     Functional Limitations Info  Sight, Hearing, Speech Sight Info: Impaired Hearing Info: Adequate Speech Info: Adequate    SPECIAL CARE FACTORS FREQUENCY  Contractures Contractures Info: Not present    Additional Factors Info  Code Status, Allergies Code Status Info: DNR Allergies Info: Ace Inhibitors, Angiotensin Receptor Blockers, Penicillins           Current Medications (12/24/2023):   This is the current hospital active medication list Current Facility-Administered Medications  Medication Dose Route Frequency Provider Last Rate Last Admin   acetaminophen  (TYLENOL ) tablet 650 mg  650 mg Oral Q6H PRN Sigdel, Santosh, MD       Or   acetaminophen  (TYLENOL ) suppository 650 mg  650 mg Rectal Q6H PRN Sigdel, Santosh, MD       acyclovir  (ZOVIRAX ) 200 MG capsule 200 mg  200 mg Oral q AM Sigdel, Santosh, MD       albuterol  (PROVENTIL ) (2.5 MG/3ML) 0.083% nebulizer solution 2.5 mg  2.5 mg Nebulization Q2H PRN Sigdel, Santosh, MD   2.5 mg at 12/24/23 0355   aspirin  EC tablet 81 mg  81 mg Oral Daily Sigdel, Santosh, MD       atorvastatin  (LIPITOR) tablet 40 mg  40 mg Oral QPM Sigdel, Santosh, MD       ceFEPIme  (MAXIPIME ) 2 g in sodium chloride  0.9 % 100 mL IVPB  2 g Intravenous Q M,W,F-1800 Tanda Dempsey SAUNDERS, RPH       Chlorhexidine  Gluconate Cloth 2 % PADS 6 each  6 each Topical Q0600 Jerrye Katheryn BROCKS, MD   6 each at 12/24/23 9378   heparin  injection 5,000 Units  5,000 Units Subcutaneous Q8H Sigdel, Derryl, MD   5,000 Units at 12/24/23 0620   hydrALAZINE  (APRESOLINE ) injection 10 mg  10 mg Intravenous Q4H PRN Sigdel, Santosh, MD       methylPREDNISolone  sodium succinate (SOLU-MEDROL ) 40 mg/mL injection 40 mg  40 mg Intravenous Q12H Sigdel, Derryl, MD   40 mg at 12/24/23 9378   pantoprazole  (PROTONIX ) EC tablet 40 mg  40 mg Oral BID Sigdel, Santosh, MD       senna-docusate (Senokot-S) tablet 1 tablet  1 tablet Oral QHS PRN Sigdel, Santosh, MD       sucralfate  (CARAFATE ) 1 GM/10ML suspension 1 g  1 g Oral BID Sigdel, Santosh, MD       vancomycin  (VANCOREADY) IVPB 750 mg/150 mL  750 mg Intravenous Q M,W,F-HD Tanda Dempsey SAUNDERS, RPH       Facility-Administered Medications Ordered in Other Encounters  Medication Dose Route Frequency Provider Last Rate Last Admin   lanreotide acetate  (SOMATULINE DEPOT ) 120 MG/0.5ML injection            lanreotide acetate  (SOMATULINE DEPOT ) 120 MG/0.5ML injection             lanreotide acetate  (SOMATULINE DEPOT ) 120 MG/0.5ML injection            lanreotide acetate  (SOMATULINE DEPOT ) 120 MG/0.5ML injection            octreotide  (SANDOSTATIN  LAR) 30 MG IM injection            octreotide  (SANDOSTATIN  LAR) 30 MG IM injection              Discharge Medications: Please see discharge summary for a list of discharge medications.  Relevant Imaging Results:  Relevant Lab Results:   Additional Information SSN: [id number]  Hoy DELENA Bigness, LCSW

## 2023-12-24 NOTE — Evaluation (Addendum)
 Clinical/Bedside Swallow Evaluation Patient Details  Name: Carly Wood MRN: [id number] Date of Birth: [birth year]  Today's Date: 12/24/2023 Time: SLP Start Time (ACUTE ONLY): 1150 SLP Stop Time (ACUTE ONLY): 1219 SLP Time Calculation (min) (ACUTE ONLY): 29 min  Past Medical History:  Past Medical History:  Diagnosis Date   Anemia     chronic macrocytic anemia   Anxiety    Chronic kidney disease    Chronic renal disease, stage 4, severely decreased glomerular filtration rate (GFR) between 15-29 mL/min/1.73 square meter (HCC) 08/22/2015   Complication of anesthesia    delirious after Breast Surgery   Dementia (HCC)    mild   Depression    Diabetes mellitus with ESRD (end-stage renal disease) (HCC)    type II   Dysphagia    Dyspnea    with activity   GERD (gastroesophageal reflux disease)    Glaucoma    Hyperlipidemia    Hypertension    Multiple myeloma (HCC)    Pneumonia    Stage 1 infiltrating ductal carcinoma of right female breast (HCC) 08/21/2015   ER+ PR+ HER 2 neu + (3+) T1cN0    Past Surgical History:  Past Surgical History:  Procedure Laterality Date   A/V FISTULAGRAM Right 07/05/2020   Procedure: A/V Fistulagram;  Surgeon: Harvey Carlin BRAVO, MD;  [facility];  Service: Cardiovascular;  Laterality: Right;   AV FISTULA PLACEMENT Left 11/22/2017   Procedure: ARTERIOVENOUS (AV) FISTULA CREATION LEFT ARM;  Surgeon: Harvey Carlin BRAVO, MD;  Location: [facility] OR;  Service: Vascular;  Laterality: Left;   AV FISTULA PLACEMENT Right 04/04/2020   Procedure: RIGHT ARM ARTERIOVENOUS FISTULA CREATION;  Surgeon: Oris Krystal FALCON, MD;  Location: [facility] ORS;  Service: Vascular;  Laterality: Right;   AV FISTULA PLACEMENT Right 08/20/2020   Procedure: ARTERIOVENOUS (AV) FISTULA LIGATION RIGHT ARM;  Surgeon: Harvey Carlin BRAVO, MD;  Location: [facility] OR;  Service: Vascular;  Laterality: Right;   BIOPSY  08/07/2016   Procedure: BIOPSY;  Surgeon: Shaaron [name], MD;  Location: [facility]  ENDO SU[facility];  Service: Endoscopy;;  gastric ulcer biopsy   COLONOSCOPY     ESOPHAGOGASTRODUODENOSCOPY N/A 08/07/2016   LA Grade A esophagitis s/p dilation, small hiatal hernia, multiple gastric ulcers and erosions, duodenal erosions s/p biopsy. Negative H.pylori    ESOPHAGOGASTRODUODENOSCOPY N/A 11/27/2016   normal esophagus, previously noted gastric ulcers completely healed, normal duodenum.    ESOPHAGOGASTRODUODENOSCOPY (EGD) WITH PROPOFOL  N/A 07/23/2020   Procedure: ESOPHAGOGASTRODUODENOSCOPY (EGD) WITH PROPOFOL ;  Surgeon: Golda Claudis PENNER, MD;  [facility];  Service: Endoscopy;  Laterality: N/A;   FISTULA SUPERFICIALIZATION Left 02/14/2018   Procedure: FISTULA SUPERFICIALIZATION LEFT ARM;  Surgeon: Eliza Lonni RAMAN, MD;  Location: [facility] OR;  Service: Vascular;  Laterality: Left;   FRACTURE SURGERY Right    ankle   HOT HEMOSTASIS  07/23/2020   Procedure: HOT HEMOSTASIS (ARGON PLASMA COAGULATION/BICAP);  Surgeon: Golda Claudis PENNER, MD;  [facility];  Service: Endoscopy;;   INTRAMEDULLARY (IM) NAIL INTERTROCHANTERIC Right 07/12/2020   Procedure: INTRAMEDULLARY (IM) NAIL INTERTROCHANTRIC;  Surgeon: Onesimo Oneil LABOR, MD;  Location: [facility];  Service: Orthopedics;  Laterality: Right;   IR FLUORO GUIDE CV LINE LEFT  12/21/2022   IR US  GUIDE VASC ACCESS LEFT  12/21/2022   MALONEY DILATION N/A 08/07/2016   Procedure: AGAPITO DILATION;  Surgeon: Shaaron [name], MD;  [facility];  Service: Endoscopy;  Laterality: N/A;   MASTECTOMY, PARTIAL Right    PERIPHERAL VASCULAR BALLOON  ANGIOPLASTY Left 07/13/2019   Procedure: PERIPHERAL VASCULAR BALLOON ANGIOPLASTY;  Surgeon: Gretta Lonni PARAS, MD;  [facility];  Service: Cardiovascular;  Laterality: Left;  arm fistulogram   PERIPHERAL VASCULAR BALLOON ANGIOPLASTY Right 05/22/2020   Procedure: PERIPHERAL VASCULAR BALLOON ANGIOPLASTY;  Surgeon: Magda Debby SAILOR, MD;  [facility];  Service:  Cardiovascular;  Laterality: Right;  arm fistula   PERIPHERAL VASCULAR BALLOON ANGIOPLASTY Right 07/05/2020   Procedure: PERIPHERAL VASCULAR BALLOON ANGIOPLASTY;  Surgeon: Harvey Carlin BRAVO, MD;  [facility];  Service: Cardiovascular;  Laterality: Right;  arm fistula   PORT-A-CATH REMOVAL Left 11/22/2017   Procedure: REMOVAL PORT-A-CATH LEFT CHEST;  Surgeon: Harvey Carlin BRAVO, MD;  Location: [facility] OR;  Service: Vascular;  Laterality: Left;   RETINAL DETACHMENT SURGERY Right    SCLEROTHERAPY  07/23/2020   Procedure: SCLEROTHERAPY;  Surgeon: Golda Claudis PENNER, MD;  [facility];  Service: Endoscopy;;   TUNNELLED CATHETER EXCHANGE N/A 08/16/2023   Procedure: TUNNELLED CATHETER EXCHANGE;  Surgeon: Magda Debby SAILOR, MD;  Location: [facility];  Service: Cardiovascular;  Laterality: N/A;   UPPER EXTREMITY VENOGRAPHY N/A 08/22/2020   Procedure: LEFT UPPER & CENTRAL VENOGRAPHY;  Surgeon: Gretta Lonni PARAS, MD;  [facility];  Service: Cardiovascular;  Laterality: N/A;   HPI:  Carly Wood is a 88 y.o. female with medical history significant of ESRD on hemodialysis MWF, multiple myeloma, breast cancer on treatment, dementia, depression, hypertension, hyperlipidemia who was sent from pain nursing center for evaluation of fever, cough, congestion, rhonchi.  Apparently patient was sent to hematology for treatment where she was found to have fever, cough and congestion.  They then recommended transfer to ED for further workup.  Patient herself is not able to provide history.  She is able to answer some basic questions.  Apparently patient and several other residents have had a fever and cough.  Patient apparently has been more drowsy than usual.  She also received a COVID-vaccine yesterday.    Assessment / Plan / Recommendation  Clinical Impression  Clinical swallowing evaluation completed while Pt was sitting upright in bed; Pt was pleasant and engaged with SLP. Pt  consumed all textures and consistencies without overt s/sx of aspiration. Pt demonstrated one congested sounding cough after initial presentations of thin liquids that did not seem related to swallowing. Pt with adequate mastication and oral manipulation of regular and solid textures and no difficulties with alternating bites and sips. Recommend D2/fine chopped diet (this is the diet Pt tolerates well at [facility]) with thin liquids and meds are ok whole with liquids. Spoke with treating SLP at facility who reports she would like Pt to have MBSS if at all possible while in acute setting as scheudling requested MBSS was not possible d/t dialysis schedule conflicting with days radiologist is present at [facility] to complete. SLP will follow Pt acutely to determine if MBSS is clinically appropriate while in acute setting. SLP Visit Diagnosis: Dysphagia, unspecified (R13.10)    Aspiration Risk  Mild aspiration risk    Diet Recommendation Dysphagia 3 (Mech soft);Thin liquid    Liquid Administration via: Cup;Straw Medication Administration: Whole meds with liquid Supervision: Patient able to self feed Compensations: Minimize environmental distractions;Slow rate;Small sips/bites Postural Changes: Seated upright at 90 degrees    Other  Recommendations Oral Care Recommendations: Oral care BID    Frequency and Duration min 1 x/week  1 week        Swallow  Study   General Date of Onset: 12/23/23 HPI: Carly Wood is a 88 y.o. female with medical history significant of ESRD on hemodialysis MWF, multiple myeloma, breast cancer on treatment, dementia, depression, hypertension, hyperlipidemia who was sent from pain nursing center for evaluation of fever, cough, congestion, rhonchi.  Apparently patient was sent to hematology for treatment where she was found to have fever, cough and congestion.  They then recommended transfer to ED for further workup.  Patient herself is not able to provide history.  She is able  to answer some basic questions.  Apparently patient and several other residents have had a fever and cough.  Patient apparently has been more drowsy than usual.  She also received a COVID-vaccine yesterday. Type of Study: Bedside Swallow Evaluation Previous Swallow Assessment: MBSS 11/04/21 - reg/thin Diet Prior to this Study: NPO Temperature Spikes Noted: No Respiratory Status: Nasal cannula History of Recent Intubation: No Behavior/Cognition: Alert;Cooperative;Pleasant mood Oral Cavity Assessment: Within Functional Limits Oral Care Completed by SLP: Recent completion by staff Oral Cavity - Dentition: Adequate natural dentition Vision: Functional for self-feeding Self-Feeding Abilities: Able to feed self Patient Positioning: Upright in bed Baseline Vocal Quality: Normal Volitional Cough: Strong Volitional Swallow: Able to elicit    Oral/Motor/Sensory Function Overall Oral Motor/Sensory Function: Within functional limits   Ice Chips Ice chips: Within functional limits   Thin Liquid Thin Liquid: Within functional limits    Nectar Thick Nectar Thick Liquid: Not tested   Honey Thick Honey Thick Liquid: Not tested   Puree Puree: Within functional limits   Solid     Solid: Within functional limits     Carly Wood H. Clois KILLIAN, CCC-SLP Speech Language Pathologist  Raguel VEAR Clois 12/24/2023,12:30 PM

## 2023-12-24 NOTE — Plan of Care (Signed)

## 2023-12-24 NOTE — Progress Notes (Signed)
*  PRELIMINARY RESULTS* Echocardiogram 2D Echocardiogram has been performed.  Carly Wood 12/24/2023, 2:24 PM

## 2023-12-24 NOTE — TOC Initial Note (Signed)
 Transition of Care [facility]) - Initial/Assessment Note    Patient Details  Name: Carly Wood MRN: [id number] Date of Birth: [birth year]  [facility]) CM/SW Contact:    Hoy DELENA Bigness, LCSW Phone Number: 12/24/2023, 9:08 AM  Clinical Narrative:                 Pt is a LTC resident at [facility] since 10/2019. Pt's sister confirms plan for pt to return to [facility] at discharge. Pt is active with [facility] in [place] for HD on MWF. CSW will continue to follow for discharge planning.   Expected Discharge Plan: Long Term Nursing Home Barriers to Discharge: Continued Medical Work up   Patient Goals and CMS Choice Patient states their goals for this hospitalization and ongoing recovery are:: For pt to return to her LTC at [facility] CMS Medicare.gov Compare Post Acute Care list provided to:: Patient Represenative (must comment) Choice offered to / list presented to : Adult Children [place] ownership interest in [facility].provided to:: Adult Children    Expected Discharge Plan and Services In-house Referral: Clinical Social Work Discharge Planning Services: NA Post Acute Care Choice: Nursing Home Living arrangements for the past 2 months: Skilled Nursing Facility                 DME Arranged: N/A DME Agency: NA                  Prior Living Arrangements/Services Living arrangements for the past 2 months: Skilled Nursing Facility Lives with:: Facility Resident Patient language and need for interpreter reviewed:: Yes Do you feel safe going back to the place where you live?: Yes      Need for Family Participation in Patient Care: Yes (Comment) Care giver support system in place?: Yes (comment) Current home services: DME (wheelchair) Criminal Activity/Legal Involvement Pertinent to Current Situation/Hospitalization: No - Comment as needed  Activities of Daily Living      Permission Sought/Granted Permission sought to share information with : Facility Social worker, Family Supports    Share Information with NAME: Jany, [place]walter (Sister)  [phone]  Permission granted to share info w AGENCY: [facility]        Emotional Assessment   Attitude/Demeanor/Rapport: Unable to Assess Affect (typically observed): Unable to Assess Orientation: : Oriented to Self, Oriented to Place Alcohol  / Substance Use: Not Applicable Psych Involvement: No (comment)  Admission diagnosis:  Acute hypoxic respiratory failure (HCC) [J96.01] Patient Active Problem List   Diagnosis Date Noted   Acute hypoxic respiratory failure (HCC) 12/23/2023   Normocytic anemia 11/11/2023   DM (diabetes mellitus), secondary, with peripheral vascular complications (HCC) 10/19/2023   Right knee pain 05/24/2023   Elevated troponin 05/24/2023   Aortic valve stenosis 05/24/2023   Prolonged Q-T interval on ECG 05/24/2023   Essential hypertension 05/04/2023   Complication associated with dialysis catheter 12/18/2022   Chronic generalized pain 08/21/2022   Numbness and tingling in both hands 07/16/2022   Post-menopausal osteoporosis 12/11/2021   Elevated liver enzymes 10/01/2021   Breast cancer (HCC) 10/01/2021   Calculus of gallbladder without cholecystitis without obstruction    History of hip fracture 07/03/2021   Edema 02/06/2021   Type 2 diabetes mellitus with hypertension and end stage renal disease on dialysis (HCC) 12/19/2020   Protein-calorie malnutrition, severe 07/31/2020   Duodenal ulcer    Acute upper GI bleed 07/22/2020   Rectal bleeding 07/22/2020   Major depression, recurrent, chronic 07/18/2020   ESRD on hemodialysis (HCC) 07/13/2020  Hyperlipidemia associated with type 2 diabetes mellitus (HCC) 06/19/2020   Aortic atherosclerosis 04/01/2020   Atherosclerotic peripheral vascular disease 04/01/2020   Hyperkalemia 12/25/2019   Dependence on renal dialysis 10/25/2019   Chronic kidney disease with end stage renal disease on dialysis due to type 2 diabetes  mellitus (HCC) 10/19/2019   Hypertension due to end stage renal disease caused by type 2 diabetes mellitus, on dialysis (HCC) 10/19/2019   ESRD (end stage renal disease) (HCC) 10/19/2019   Herpes 10/19/2019   Vascular dementia with behavior disturbance (HCC) 10/19/2019   Diabetes mellitus with ESRD (end-stage renal disease) (HCC) 10/12/2019   Multiple myeloma (HCC) 10/04/2018   MGUS (monoclonal gammopathy of unknown significance) 09/20/2018   Iron  deficiency anemia 05/03/2017   Multiple gastric ulcers 11/10/2016   Dysphagia 07/31/2016   GERD (gastroesophageal reflux disease) 07/31/2016   Lymphedema of arm 08/22/2015   Stage 1 infiltrating ductal carcinoma of right female breast (HCC) 08/21/2015   PCP:  [name], NP Pharmacy:   [facility] [place], [place] - [address] [place] [address] Phone: [phone] Fax: [phone]     Social Drivers of Health (SDOH) Social History: SDOH Screenings   Food Insecurity: Patient Unable To Answer (12/24/2023)  Housing: Patient Unable To Answer (12/24/2023)  Transportation Needs: Patient Unable To Answer (12/24/2023)  Utilities: Patient Unable To Answer (12/24/2023)  Alcohol  Screen: Low Risk  (01/09/2020)  Depression (PHQ2-9): Low Risk  (12/16/2023)  Financial Resource Strain: Low Risk  (01/09/2020)  Physical Activity: Inactive (01/09/2020)  Social Connections: Patient Unable To Answer (12/24/2023)  Stress: No Stress Concern Present (01/09/2020)  Tobacco Use: Low Risk  (12/23/2023)   SDOH Interventions:     Readmission Risk Interventions    12/24/2023    9:04 AM 05/25/2023    9:03 AM 10/03/2021   12:36 PM  Readmission Risk Prevention Plan  Transportation Screening Complete Complete Complete  PCP or Specialist Appt within 5-7 Days Complete    PCP or Specialist Appt within 3-5 Days   Complete  Home Care Screening Complete    Medication Review (RN CM) Complete    HRI or  Home Care Consult  Complete Complete  Social Work Consult for Recovery Care Planning/Counseling  Complete Complete  Palliative Care Screening  Not Applicable Complete  Medication Review Oceanographer)  Complete Complete

## 2023-12-24 NOTE — Progress Notes (Signed)
 Progress Note   Patient: Carly Wood FMW:[id number] DOB: September 08, 1935 DOA: 12/23/2023     1 DOS: the patient was seen and examined on 12/24/2023   Brief hospital admission narrative: As per H&P written by Dr. Cathaleen on 12/23/23 Carly Wood is a 88 y.o. female with medical history significant of ESRD on hemodialysis MWF, multiple myeloma, breast cancer on treatment, dementia, depression, hypertension, hyperlipidemia who was sent from pain nursing center for evaluation of fever, cough, congestion, rhonchi. Apparently patient was sent to hematology for treatment where she was found to have fever, cough and congestion.  They then recommended transfer to ED for further workup.  Patient herself is not able to provide history.  She is able to answer some basic questions.  Apparently patient and several other residents have had a fever and cough.  Patient apparently has been more drowsy than usual.  She also received a COVID-vaccine yesterday.     ED Course: In the ER she appeared ill, she was tachycardic, febrile, wheezy on exam.  Blood pressure stable.  Chest x-ray did not reveal any acute cardiopulmonary abnormalities.  Patient was initially placed on nasal cannula however later switched to BiPAP due to increasing hypoxia.  ABG reveals pH 7.41, pCO2 55, pO2 42.  BNP 140, potassium 3.3, creatinine 5.39, calcium  10.5.  Lactic acid 1.1.  Viral respiratory panel including influenza A, influenza B, COVID-19, RSV are negative.  Received IV ceftriaxone and doxycycline .  Nephrology was consulted by ER and she is being planned for hemodialysis today Hospitalist service was called to evaluate the patient.  Assessment and plan 1-acute hypoxic respiratory failure - Appears to be a combination of reactive airways disease along with hypervolemia - Volume management to be achieved with hemodialysis - Will continue to wean patient off BiPAP and titrate down oxygen supplementation to maintain saturation above  90%. - Continue IV antibiotics, steroids, bronchodilator management and mucolytic's - Follow clinical response.  2-fever/cough and congestion/wheezing - Viral respiratory panel including COVID-19, influenza A/B and RSV are negative - Follow sputum culture results - Continue IV antibiotics - Continue steroids and bronchodilator - Follow clinical response. - Volume management and treatment for bronchitis and reactive airway disease as mentioned above.  3-ESRD on hemodialysis - Monday-Wednesday and Friday - Appreciate assistance and recommendation by nephrology service - Planning for hemodialysis later today.  4-acute metabolic encephalopathy - Appears to be multifactorial in the setting of acute hypoxic respiratory failure/hypercapnia and febrile illness - Continue management as mentioned above - Wean patient off BiPAP - Minimize sedative agents.  5-anemia of chronic disease - No overt bleeding appreciated - Continue to follow hemoglobin trend. - IV iron  and Epogen  therapy as per nephrology discretion.  6-history of breast cancer - Continue patient follow-up with oncology service.  7-history of multiple myeloma - Continue patient follow-up.  8-social/ethics - Patient is DNR/DNI - Continue aggressive therapy without invasive intervention or intubations. - If patient further decompensate plan will be for comfort management.  9-chronic diastolic heart failure - proBNP over 35,000 at time of admission - Volume management per hemodialysis - Continue supportive care.  Subjective:  Somnolent and chronically ill in appearance; was able to follow simple commands.  BiPAP in place at time of examination.  Physical Exam: Vitals:   12/24/23 1040 12/24/23 1115 12/24/23 1231 12/24/23 1621  BP:      Pulse:      Resp:      Temp:  97.9 F (36.6 C)  98.3 F (36.8 C)  TempSrc:  Axillary  Oral  SpO2: 98% 100% 98%   Weight:      Height:       General exam: Currently  afebrile. Respiratory system: Bilateral rhonchi, decreased breath sounds at the bases and mild expiratory wheezing on exam. Cardiovascular system: Positive holosystolic murmur appreciated on exam; no rubs, no gallops, no JVD. Gastrointestinal system: Abdomen is nondistended, soft and nontender. No organomegaly or masses felt. Normal bowel sounds heard. Central nervous system: Moving 4 limbs spontaneously.  No focal neurological deficits. Extremities: No cyanosis or clubbing; trace edema appreciated bilaterally. Skin: No petechiae. Psychiatry: Flat affect appreciated on exam.   Latest data Reviewed: CBC: WBC 7.5, hemoglobin 9.7 and platelet count 196K Comprehensive metabolic panel: Sodium 140, potassium 3.3, chloride 94, bicarb 30, BUN 18, creatinine 5.39, normal LFTs and GFR 17.  Family Communication: No family at bedside.  Disposition: Status is: Inpatient Remains inpatient appropriate because: Continue IV therapy.  Anticipating discharge back to nursing home once medically stable.  Time spent: 50 minutes  Author: Eric Nunnery, MD 12/24/2023 5:59 PM  For on call review [email or web address].

## 2023-12-25 DIAGNOSIS — J9601 Acute respiratory failure with hypoxia: Secondary | ICD-10-CM | POA: Diagnosis not present

## 2023-12-25 DIAGNOSIS — C50911 Malignant neoplasm of unspecified site of right female breast: Secondary | ICD-10-CM | POA: Diagnosis not present

## 2023-12-25 DIAGNOSIS — N186 End stage renal disease: Secondary | ICD-10-CM | POA: Diagnosis not present

## 2023-12-25 DIAGNOSIS — I10 Essential (primary) hypertension: Secondary | ICD-10-CM | POA: Diagnosis not present

## 2023-12-25 LAB — BASIC METABOLIC PANEL WITH GFR
Anion gap: 17 — ABNORMAL HIGH (ref 5–15)
BUN: 48 mg/dL — ABNORMAL HIGH (ref 8–23)
CO2: 25 mmol/L (ref 22–32)
Calcium: 9.2 mg/dL (ref 8.9–10.3)
Chloride: 92 mmol/L — ABNORMAL LOW (ref 98–111)
Creatinine, Ser: 6.9 mg/dL — ABNORMAL HIGH (ref 0.44–1.00)
GFR, Estimated: 5 mL/min — ABNORMAL LOW (ref >60)
Glucose, Bld: 161 mg/dL — ABNORMAL HIGH (ref 70–99)
Potassium: 3.9 mmol/L (ref 3.5–5.1)
Sodium: 134 mmol/L — ABNORMAL LOW (ref 135–145)

## 2023-12-25 LAB — CBC
HCT: 28.6 % — ABNORMAL LOW (ref 36.0–46.0)
Hemoglobin: 9 g/dL — ABNORMAL LOW (ref 12.0–15.0)
MCH: 34.1 pg — ABNORMAL HIGH (ref 26.0–34.0)
MCHC: 31.5 g/dL (ref 30.0–36.0)
MCV: 108.3 fL — ABNORMAL HIGH (ref 80.0–100.0)
Platelets: 198 K/uL (ref 150–400)
RBC: 2.64 MIL/uL — ABNORMAL LOW (ref 3.87–5.11)
RDW: 14.6 % (ref 11.5–15.5)
WBC: 5.7 K/uL (ref 4.0–10.5)
nRBC: 0 % (ref 0.0–0.2)

## 2023-12-25 LAB — HEPATITIS B SURFACE ANTIBODY, QUANTITATIVE: Hep B S AB Quant (Post): <3.5 m[IU]/mL — ABNORMAL LOW

## 2023-12-25 MED ORDER — HEPARIN SODIUM (PORCINE) 1000 UNIT/ML IJ SOLN
INTRAMUSCULAR | Status: AC
  Filled 2023-12-25: qty 4

## 2023-12-25 MED ORDER — HEPARIN SODIUM (PORCINE) 1000 UNIT/ML DIALYSIS
1000.0000 [IU] | INTRAMUSCULAR | Status: DC | PRN
  Administered 2023-12-25: 3800 [IU]

## 2023-12-25 MED ORDER — LIDOCAINE HCL (PF) 1 % IJ SOLN: 5.0000 mL | INTRAMUSCULAR | Status: DC | PRN

## 2023-12-25 MED ORDER — PENTAFLUOROPROP-TETRAFLUOROETH EX AERO: 1.0000 | INHALATION_SPRAY | CUTANEOUS | Status: DC | PRN

## 2023-12-25 MED ORDER — ANTICOAGULANT SODIUM CITRATE 4% (200MG/5ML) IV SOLN: 5.0000 mL | Status: DC | PRN

## 2023-12-25 MED ORDER — ALTEPLASE 2 MG IJ SOLR: 2.0000 mg | Freq: Once | INTRAMUSCULAR | Status: DC | PRN

## 2023-12-25 NOTE — Procedures (Signed)
 Received patient in bed to unit.  Alert and oriented.  Informed consent signed and in chart.  LIJ tunneled dual lumen catheter accessed per policy, red lumen sluggish. Labs drawn, labeled and lab notified. Tx initiated per MD order. UF goal 3000 ml.  TX duration:3.5 hours  Tx ocmoplete. Blood rturned. Dual lumen catheter flushed with NS, heparin  left to dwell. Caps and clamps in place.  Patient tolerated well.  Transported back to the room  Alert, without acute distress.  Hand-off given to patient's nurse.   Access used: LIJ tunneled dual lumen catheter Access issues: none  Total UF removed: 3000 ml Medication(s) given: See MAR   [facility] LITTIE [facility]

## 2023-12-25 NOTE — Progress Notes (Signed)
 Progress Note   Patient: Carly Wood FMW:[id number] DOB: November 12, 1935 DOA: 12/23/2023     2 DOS: the patient was seen and examined on 12/25/2023   Brief hospital admission narrative: As per H&P written by Dr. Cathaleen on 12/23/23 Carly Wood is a 88 y.o. female with medical history significant of ESRD on hemodialysis MWF, multiple myeloma, breast cancer on treatment, dementia, depression, hypertension, hyperlipidemia who was sent from pain nursing center for evaluation of fever, cough, congestion, rhonchi. Apparently patient was sent to hematology for treatment where she was found to have fever, cough and congestion.  They then recommended transfer to ED for further workup.  Patient herself is not able to provide history.  She is able to answer some basic questions.  Apparently patient and several other residents have had a fever and cough.  Patient apparently has been more drowsy than usual.  She also received a COVID-vaccine yesterday.     ED Course: In the ER she appeared ill, she was tachycardic, febrile, wheezy on exam.  Blood pressure stable.  Chest x-ray did not reveal any acute cardiopulmonary abnormalities.  Patient was initially placed on nasal cannula however later switched to BiPAP due to increasing hypoxia.  ABG reveals pH 7.41, pCO2 55, pO2 42.  BNP 140, potassium 3.3, creatinine 5.39, calcium  10.5.  Lactic acid 1.1.  Viral respiratory panel including influenza A, influenza B, COVID-19, RSV are negative.  Received IV ceftriaxone and doxycycline .  Nephrology was consulted by ER and she is being planned for hemodialysis today Hospitalist service was called to evaluate the patient.  Assessment and plan 1-acute hypoxic respiratory failure - Appears to be a combination of reactive airways disease along with hypervolemia - Volume management to be achieved with hemodialysis - Patient is currently off BiPAP and demonstrating good oxygen saturation on 2 L supplementation. - Continue  antibiotics therapy, steroids, bronchodilator management and mucolytic's. - Follow clinical response and continue supportive care.. - Patient will be transferred to telemetry bed.  2-fever/cough and congestion/wheezing - Viral respiratory panel including COVID-19, influenza A/B and RSV are negative - Follow sputum culture results - Continue IV antibiotics; at this moment vancomycin  has been discontinued in the setting of negative MRSA. - Continue steroids and bronchodilator - Follow clinical response. - Volume management and treatment for bronchitis and reactive airway disease as mentioned above.  3-ESRD on hemodialysis - Monday-Wednesday and Friday - Appreciate assistance and recommendation by nephrology service - Planning for back-to-back hemodialysis, patient had hemodialysis on 12/24/2023 and will have another treatment later today (12/25/2023).  4-acute metabolic encephalopathy - Appears to be multifactorial in the setting of acute hypoxic respiratory failure/hypercapnia and febrile illness - Continue management as mentioned above - Wean patient off BiPAP - Minimize sedative agents.  5-anemia of chronic disease - No overt bleeding appreciated - Continue to follow hemoglobin trend. - IV iron  and Epogen  therapy as per nephrology discretion.  6-history of breast cancer - Continue patient follow-up with oncology service.  7-history of multiple myeloma - Continue patient follow-up.  8-social/ethics - Patient is DNR/DNI - Continue aggressive therapy without invasive intervention or intubations. - If patient further decompensate plan will be for comfort management.  9-chronic diastolic heart failure - proBNP over 35,000 at time of admission - Volume management per hemodialysis - Continue supportive care.  Subjective:  Patient is alert, awake and oriented x 3; demonstrating good saturation on 2 L supplementation.  No chest pain, no nausea, no vomiting.  Currently having  hemodialysis and requesting her  diet to be advanced as she is very hungry.  Physical Exam: Vitals:   12/25/23 1345 12/25/23 1356 12/25/23 1400 12/25/23 1412  BP: 120/73 122/75 135/73 (!) 124/57  Pulse: 82 85 79 79  Resp: 12 (!) 25 16 (!) 21  Temp:   (!) 97.3 F (36.3 C) 98.2 F (36.8 C)  TempSrc:   Oral Oral  SpO2: 100% 100% 100% 100%  Weight:   60 kg   Height:       General exam: Alert, awake, oriented x 3, following commands appropriately and demonstrating good oxygen saturation on 2 L supplementation. Respiratory system: Positive scattered rhonchi; decreased breath sounds at the bases.  No wheezing. Cardiovascular system: Rate controlled, no rubs, no gallops, no JVD on exam.  Positive murmur appreciated. Gastrointestinal system: Abdomen is nondistended, soft and nontender. No organomegaly or masses felt. Normal bowel sounds heard. Central nervous system: Moving 4 limbs spontaneously.  No focal neurological deficits. Extremities: No cyanosis or clubbing; trace edema appreciated bilaterally. Skin: No petechiae. Psychiatry: Judgement and insight appear normal.  Flat affect on exam.  Latest data Reviewed: CBC: WBC WBC 5.7, hemoglobin 9.0 and platelet count 1 98K Basic metabolic panel: Sodium 134, potassium 3.9, chloride 92, bicarb 25, BUN 48, creatinine 6.90 GFR 5  Family Communication: No family at bedside.  Disposition: Status is: Inpatient Remains inpatient appropriate because: Continue IV therapy.  Anticipating discharge back to nursing home once medically stable.  Time spent: 50 minutes  Author: Eric Nunnery, MD 12/25/2023 2:34 PM  For on call review [email or web address].

## 2023-12-25 NOTE — Plan of Care (Signed)

## 2023-12-26 DIAGNOSIS — J9601 Acute respiratory failure with hypoxia: Secondary | ICD-10-CM | POA: Diagnosis not present

## 2023-12-26 DIAGNOSIS — I10 Essential (primary) hypertension: Secondary | ICD-10-CM | POA: Diagnosis not present

## 2023-12-26 DIAGNOSIS — C50911 Malignant neoplasm of unspecified site of right female breast: Secondary | ICD-10-CM | POA: Diagnosis not present

## 2023-12-26 DIAGNOSIS — N186 End stage renal disease: Secondary | ICD-10-CM | POA: Diagnosis not present

## 2023-12-26 MED ORDER — CHLORHEXIDINE GLUCONATE CLOTH 2 % EX PADS
6.0000 | MEDICATED_PAD | Freq: Every day | CUTANEOUS | Status: DC
  Administered 2023-12-26 – 2023-12-27 (×2): 6 via TOPICAL

## 2023-12-26 NOTE — Progress Notes (Signed)
 Progress Note   Patient: Carly Wood FMW:[id number] DOB: March 02, 1936 DOA: 12/23/2023     3 DOS: the patient was seen and examined on 12/26/2023   Brief hospital admission narrative: As per H&P written by Dr. Cathaleen on 12/23/23 Carly Wood is a 88 y.o. female with medical history significant of ESRD on hemodialysis MWF, multiple myeloma, breast cancer on treatment, dementia, depression, hypertension, hyperlipidemia who was sent from pain nursing center for evaluation of fever, cough, congestion, rhonchi. Apparently patient was sent to hematology for treatment where she was found to have fever, cough and congestion.  They then recommended transfer to ED for further workup.  Patient herself is not able to provide history.  She is able to answer some basic questions.  Apparently patient and several other residents have had a fever and cough.  Patient apparently has been more drowsy than usual.  She also received a COVID-vaccine yesterday.     ED Course: In the ER she appeared ill, she was tachycardic, febrile, wheezy on exam.  Blood pressure stable.  Chest x-ray did not reveal any acute cardiopulmonary abnormalities.  Patient was initially placed on nasal cannula however later switched to BiPAP due to increasing hypoxia.  ABG reveals pH 7.41, pCO2 55, pO2 42.  BNP 140, potassium 3.3, creatinine 5.39, calcium  10.5.  Lactic acid 1.1.  Viral respiratory panel including influenza A, influenza B, COVID-19, RSV are negative.  Received IV ceftriaxone and doxycycline .  Nephrology was consulted by ER and she is being planned for hemodialysis today Hospitalist service was called to evaluate the patient.  Assessment and plan 1-acute hypoxic respiratory failure - Appears to be a combination of reactive airways disease along with hypervolemia - Volume management to be achieved with hemodialysis - Patient is currently off BiPAP and demonstrating good oxygen saturation on 2 L supplementation. - Continue  antibiotics therapy, steroids, bronchodilator management and mucolytic's. - Follow clinical response and continue supportive care.. - Patient will most likely be medically ready for discharge after hemodialysis on 12/27/2023.  2-fever/cough and congestion/wheezing - Viral respiratory panel including COVID-19, influenza A/B and RSV are negative - Follow sputum culture results - Continue IV antibiotics; at this moment vancomycin  has been discontinued in the setting of negative MRSA. - Continue steroids and bronchodilator - Follow clinical response. - Volume management and treatment for bronchitis and reactive airway disease as mentioned above.  3-ESRD on hemodialysis - Monday-Wednesday and Friday - Appreciate assistance and recommendation by nephrology service - Planning for back-to-back hemodialysis, patient had hemodialysis on 12/24/2023 and and on 12/25/2023. - Planning for hemodialysis on 12/27/2023 in order to get back on regular schedule and most likely discharge after treatment.  4-acute metabolic encephalopathy - Appears to be multifactorial in the setting of acute hypoxic respiratory failure/hypercapnia and febrile illness - Continue management as mentioned above - Wean patient off BiPAP - Minimize sedative agents.  5-anemia of chronic disease - No overt bleeding appreciated - Continue to follow hemoglobin trend. - IV iron  and Epogen  therapy as per nephrology discretion.  6-history of breast cancer - Continue patient follow-up with oncology service.  7-history of multiple myeloma - Continue patient follow-up.  8-social/ethics - Patient is DNR/DNI - Continue aggressive therapy without invasive intervention or intubations. - If patient further decompensate plan will be for comfort management.  9-chronic diastolic heart failure - proBNP over 35,000 at time of admission - Volume management per hemodialysis - Continue supportive care.  Subjective:  No fever, no chest  pain, no nausea, no  vomiting.  Reports breathing continue to improve.  2 L nasal cannula supplementation in place.  Physical Exam: Vitals:   12/26/23 0845 12/26/23 0848 12/26/23 1330 12/26/23 1446  BP:   130/65   Pulse:   86   Resp:   17   Temp:   98.3 F (36.8 C)   TempSrc:   Oral   SpO2: 98% 100% 98% 98%  Weight:      Height:       General exam: Alert, awake, oriented x 3; reports feeling better and denying chest pain.  Breathing continued to stabilize.  Patient is afebrile Respiratory system: Good saturation on 2 L supplementation; no using accessory muscles.  Decreased breath sounds at the bases and positive scattered rhonchi. Cardiovascular system: Rate controlled, no rubs, no gallops, no JVD on today's exam. Gastrointestinal system: Abdomen is nondistended, soft and nontender. No organomegaly or masses felt. Normal bowel sounds heard. Central nervous system: Alert and oriented. No focal neurological deficits. Extremities: No cyanosis or clubbing; trace edema appreciated bilaterally. Skin: No petechiae. Psychiatry: Flat affect appreciated on exam.  Latest data Reviewed: CBC: WBC WBC 5.7, hemoglobin 9.0 and platelet count 1 98K Basic metabolic panel: Sodium 134, potassium 3.9, chloride 92, bicarb 25, BUN 48, creatinine 6.90 GFR 5  Family Communication: No family at bedside.  Disposition: Status is: Inpatient Remains inpatient appropriate because: Continue IV therapy.  Anticipating discharge back to nursing home once medically stable.  Time spent: 50 minutes  Author: Eric Nunnery, MD 12/26/2023 3:54 PM  For on call review [email or web address].

## 2023-12-27 ENCOUNTER — Inpatient Hospital Stay (HOSPITAL_COMMUNITY)

## 2023-12-27 DIAGNOSIS — J9601 Acute respiratory failure with hypoxia: Secondary | ICD-10-CM | POA: Diagnosis not present

## 2023-12-27 LAB — CBC
HCT: 28.5 % — ABNORMAL LOW (ref 36.0–46.0)
Hemoglobin: 9.2 g/dL — ABNORMAL LOW (ref 12.0–15.0)
MCH: 34.3 pg — ABNORMAL HIGH (ref 26.0–34.0)
MCHC: 32.3 g/dL (ref 30.0–36.0)
MCV: 106.3 fL — ABNORMAL HIGH (ref 80.0–100.0)
Platelets: 195 K/uL (ref 150–400)
RBC: 2.68 MIL/uL — ABNORMAL LOW (ref 3.87–5.11)
RDW: 14.6 % (ref 11.5–15.5)
WBC: 5.8 K/uL (ref 4.0–10.5)
nRBC: 0 % (ref 0.0–0.2)

## 2023-12-27 LAB — RENAL FUNCTION PANEL
Albumin: 4.2 g/dL (ref 3.5–5.0)
Anion gap: 18 — ABNORMAL HIGH (ref 5–15)
BUN: 52 mg/dL — ABNORMAL HIGH (ref 8–23)
CO2: 24 mmol/L (ref 22–32)
Calcium: 9.5 mg/dL (ref 8.9–10.3)
Chloride: 92 mmol/L — ABNORMAL LOW (ref 98–111)
Creatinine, Ser: 6.89 mg/dL — ABNORMAL HIGH (ref 0.44–1.00)
GFR, Estimated: 5 mL/min — ABNORMAL LOW (ref >60)
Glucose, Bld: 163 mg/dL — ABNORMAL HIGH (ref 70–99)
Phosphorus: 3.1 mg/dL (ref 2.5–4.6)
Potassium: 4.1 mmol/L (ref 3.5–5.1)
Sodium: 134 mmol/L — ABNORMAL LOW (ref 135–145)

## 2023-12-27 MED ORDER — ALTEPLASE 2 MG IJ SOLR: 2.0000 mg | Freq: Once | INTRAMUSCULAR | Status: DC | PRN

## 2023-12-27 MED ORDER — LIDOCAINE-PRILOCAINE 2.5-2.5 % EX CREA
1.0000 | TOPICAL_CREAM | CUTANEOUS | Status: DC | PRN
Start: 2023-12-27 — End: 2023-12-27

## 2023-12-27 MED ORDER — PENTAFLUOROPROP-TETRAFLUOROETH EX AERO: 1.0000 | INHALATION_SPRAY | CUTANEOUS | Status: DC | PRN

## 2023-12-27 MED ORDER — DM-GUAIFENESIN ER 30-600 MG PO TB12: 1.0000 | ORAL_TABLET | Freq: Two times a day (BID) | ORAL | 0 refills | Status: AC

## 2023-12-27 MED ORDER — LIDOCAINE HCL (PF) 1 % IJ SOLN: 5.0000 mL | INTRAMUSCULAR | Status: DC | PRN

## 2023-12-27 MED ORDER — ENSURE PLUS HIGH PROTEIN PO LIQD: 237.0000 mL | Freq: Two times a day (BID) | ORAL | Status: DC

## 2023-12-27 MED ORDER — METHYLPREDNISOLONE 4 MG PO TBPK: ORAL_TABLET | ORAL | 0 refills | Status: DC

## 2023-12-27 MED ORDER — ANTICOAGULANT SODIUM CITRATE 4% (200MG/5ML) IV SOLN: 5.0000 mL | Status: DC | PRN

## 2023-12-27 MED ORDER — HEPARIN SODIUM (PORCINE) 1000 UNIT/ML DIALYSIS
1000.0000 [IU] | INTRAMUSCULAR | Status: DC | PRN
  Administered 2023-12-27: 3800 [IU]

## 2023-12-27 MED ORDER — LEVOFLOXACIN 250 MG PO TABS: 250.0000 mg | ORAL_TABLET | Freq: Every day | ORAL | 0 refills | Status: AC

## 2023-12-27 MED ORDER — HEPARIN SODIUM (PORCINE) 1000 UNIT/ML IJ SOLN
INTRAMUSCULAR | Status: AC
  Filled 2023-12-27: qty 4

## 2023-12-27 NOTE — Care Management Important Message (Signed)
 Important Message  Patient Details  Name: Carly Wood MRN: [id number] Date of Birth: 12/28/1935   Important Message Given:  Yes - Medicare IM     Seleste Tallman L Darus Hershman 12/27/2023, 11:34 AM

## 2023-12-27 NOTE — Progress Notes (Signed)
 Late note entry 10/20 3:41pm  d/c noted, contacted out-pt HD [facility], [facility] to inform of d/c and anticipated arrival back on Wednesday. D/c summ and last note faxed over, no further support needed.

## 2023-12-27 NOTE — Progress Notes (Signed)
 Modified Barium Swallow Study  Patient Details  Name: Carly Wood MRN: [id number] Date of Birth: 11-16-1935  Today's Date: 12/27/2023  Modified Barium Swallow completed.  Full report located under Chart Review in the Imaging Section.  History of Present Illness Carly Wood is a 88 y.o. female with medical history significant of ESRD on hemodialysis MWF, multiple myeloma, breast cancer on treatment, dementia, depression, hypertension, hyperlipidemia who was sent from pain nursing center for evaluation of fever, cough, congestion, rhonchi.  Apparently patient was sent to hematology for treatment where she was found to have fever, cough and congestion.  They then recommended transfer to ED for further workup.  Patient herself is not able to provide history.  She is able to answer some basic questions.  Apparently patient and several other residents have had a fever and cough.  Patient apparently has been more drowsy than usual.  She also received a COVID-vaccine yesterday. BSE completed last week and MBSS recommended prior to d/c back to [facility], as it was requested as an outpatient before admission and they had difficulty scheduling it due to dialysis schedule.   Clinical Impression MBSS completed during acute stay and Pt presents with mild oropharyngeal dyshphagia characterized by mildly prolonged mastication with solids, delay in swallow trigger which occurs after filling the pyriforms with thins, nectars, and regular textures. Pt with possible lateral pharyngocele that was only observed filling with a small amount of puree and one cup sip of thins and then cleared with repeat swallow (this was not observed on the other swallows). Trace penetration with thin liquids was observed after the swallow with straw sips thin and when taking the barium tablet with sequential cup sips of thin. This elicited a subtle throat clear and was not aspirated, but Pt required verbal cue to swallow again. She did not  swallow the barium tablet when taking the thin liquids (ended up swallowing it after she swallowed the liquids without liquid in her mouth). Recommend regular textures (if staffing can accommodate cutting up her foods for her) and thin liquids via cup sip and pills whole with puree. Ok for trials of straw sips with SLP at SNF if desired.  Factors that may increase risk of adverse event in presence of aspiration Carly Wood & Carly Wood 2021): Limited mobility  Swallow Evaluation Recommendations Recommendations: PO diet PO Diet Recommendation: Regular;Dysphagia 3 (Mechanical soft);Thin liquids (Level 0) Liquid Administration via: Cup;No straw (penetration to vocal cords but expelled with straw sips) Medication Administration: Whole meds with puree Supervision: Patient able to self-feed;Intermittent supervision/cueing for swallowing strategies;Set-up assistance for safety Swallowing strategies  : Slow rate;Multiple dry swallows after each bite/sip Postural changes: Position pt fully upright for meals;Stay upright 30-60 min after meals Oral care recommendations: Oral care BID (2x/day);Staff/trained caregiver to provide oral care    Thank you,  Carly Wood, CCC-SLP [phone]   Carly Wood 12/27/2023,3:25 PM

## 2023-12-27 NOTE — Discharge Summary (Signed)
 Physician Discharge Summary   Patient: Carly Wood MRN: [id number] DOB: 10/18/1935  Admit date:     12/23/2023  Discharge date: 12/27/23  Discharge Physician: Adriana DELENA Grams   PCP: [name], NP    Follow with the PCP in 1 week Follow-up with the nephrologist as scheduled, continue hemodialysis as scheduled, last dialysis today 12/27/2023 Continue this course of antibiotics, Medrol  Dosepak-steroid taper Continue use of inspirometer, bronchodilators as needed  Discharge Diagnoses: Principal Problem:   Acute hypoxic respiratory failure (HCC) Active Problems:   ESRD on hemodialysis (HCC)   Stage 1 infiltrating ductal carcinoma of right female breast (HCC)   Diabetes mellitus with ESRD (end-stage renal disease) (HCC)   Essential hypertension   Aortic valve stenosis   Carly Wood is a 88 y.o. female with medical history significant of ESRD on hemodialysis MWF, multiple myeloma, breast cancer on treatment, dementia, depression, hypertension, hyperlipidemia who was sent from pain nursing center for evaluation of fever, cough, congestion, rhonchi. Apparently patient was sent to hematology for treatment where she was found to have fever, cough and congestion.  They then recommended transfer to ED for further workup.  Patient herself is not able to provide history.  She is able to answer some basic questions.  Apparently patient and several other residents have had a fever and cough.  Patient apparently has been more drowsy than usual.  She also received a COVID-vaccine.  ER she appeared ill, she was tachycardic, febrile, wheezy on exam. Blood pressure stable. Chest x-ray did not reveal any acute cardiopulmonary abnormalities. Patient was initially placed on nasal cannula however later switched to BiPAP due to increasing hypoxia. ABG reveals pH 7.41, pCO2 55, pO2 42. BNP 140, potassium 3.3, creatinine 5.39, calcium  10.5. Lactic acid 1.1. Viral respiratory panel including influenza  A, influenza B, COVID-19, RSV are negative. Received IV ceftriaxone and doxycycline . Nephrology was consulted by ER and she is being planned for hemodialysis    acute hypoxic respiratory failure - Resolved -.  Active airway with hypervolemia -Briefly on BiPAP, down to 2 L of oxygen, satting 100% can be tapered off oxygen altogether O2 sat goal > 94% -Switched IV antibiotics to p.o. Medrol  Dosepak taper -Status post treated with IV antibiotics cefepime , switched to p.o. Levaquin  to complete a 7-day course -Continue as needed inhalers, bronchodilators, mucolytics, incentive spirometer    -fever/cough and congestion/wheezing >>> resolved - Viral respiratory panel including COVID-19, influenza A/B and RSV are negative - Cultures, no growth to date -Status post aggressive treatment with IV antibiotics cefepime , vancomycin -MRSA negative    3ESRD on hemodialysis - Monday-Wednesday and Friday - Appreciate assistance and recommendation by nephrology service - Planning for back-to-back hemodialysis, patient had hemodialysis on 12/24/2023 and and on 12/25/2023. - Last Hemodialysis on 12/27/2023     Acute metabolic encephalopathy - Appears to be multifactorial in the setting of acute hypoxic respiratory failure/hypercapnia and febrile illness Resolved   Anemia of chronic disease - No overt bleeding appreciated - IV iron  and Epogen  therapy as per nephrology discretion.   H/o  breast cancer - Continue patient follow-up with oncology service.   H/o Multiple Myeloma - Continue patient follow-up.   Ethics - Patient is DNR/DNI - Continue aggressive therapy without invasive intervention or intubations. - If patient further decompensate plan will be for comfort management.   Chronic diastolic heart failure - Volume management w HD   Consultants: Nephrologist Procedures performed: Hemodialysis Disposition: SNF Diet recommendation:  Discharge Diet Orders (From admission, onward)  Start     Ordered   12/27/23 0000  Diet - low sodium heart healthy        12/27/23 1131           Cardiac and Carb modified diet DISCHARGE MEDICATION: Allergies as of 12/27/2023       Reactions   Ace Inhibitors Other (See Comments), Cough   Tongue swelling Angioedema    Angiotensin Receptor Blockers Other (See Comments)   Angioedema with ACE-I   Penicillins Other (See Comments)   Unknown reaction        Medication List     STOP taking these medications    anastrozole  1 MG tablet Commonly known as: ARIMIDEX        TAKE these medications    acetaminophen  325 MG tablet Commonly known as: TYLENOL  Take 650 mg by mouth 3 (three) times daily.   acyclovir  200 MG capsule Commonly known as: ZOVIRAX  Take 200 mg by mouth in the morning. (0800)   aspirin  EC 81 MG tablet Take 1 tablet (81 mg total) by mouth daily. Swallow whole.   atorvastatin  40 MG tablet Commonly known as: LIPITOR Take 1 tablet (40 mg total) by mouth daily. What changed: when to take this   Calcium  + Vitamin D3 500-5 MG-MCG Tabs Generic drug: Calcium  Carb-Cholecalciferol  Take 1 tablet by mouth daily.   dextromethorphan-guaiFENesin 30-600 MG 12hr tablet Commonly known as: MUCINEX DM Take 1 tablet by mouth 2 (two) times daily for 5 days.   levofloxacin  250 MG tablet Commonly known as: Levaquin  Take 1 tablet (250 mg total) by mouth daily for 3 days. Start taking on: December 28, 2023   melatonin 5 MG Tabs Take 5 mg by mouth at bedtime.   methylPREDNISolone  4 MG Tbpk tablet Commonly known as: MEDROL  DOSEPAK Medrol  Dosepak take as instructed   multivitamin Tabs tablet Take 1 tablet by mouth at bedtime.   ondansetron  4 MG disintegrating tablet Commonly known as: ZOFRAN -ODT Take 4 mg by mouth daily before breakfast.   pantoprazole  40 MG tablet Commonly known as: PROTONIX  Take 40 mg by mouth 2 (two) times daily.   sennosides-docusate sodium 8.6-50 MG tablet Commonly known as:  SENOKOT-S Take 1 tablet by mouth 2 (two) times daily.   sevelamer  carbonate 800 MG tablet Commonly known as: RENVELA  Take 800 mg by mouth daily. What changed: when to take this   sucralfate  1 GM/10ML suspension Commonly known as: CARAFATE  Take 10 mLs by mouth 2 (two) times daily.               Discharge Care Instructions  (From admission, onward)           Start     Ordered   12/27/23 0000  Discharge wound care:       Comments: Per RN wound care instructions   12/27/23 1131            Discharge Exam: Filed Weights   12/25/23 1400 12/27/23 0300 12/27/23 0929  Weight: 60 kg 65 kg 65.2 kg        General:  AAO x 3,  cooperative, no distress;   HEENT:  Normocephalic, PERRL, otherwise with in Normal limits   Neuro:  CNII-XII intact. , normal motor and sensation, reflexes intact   Lungs:   Clear to auscultation BL, Respirations unlabored,  No wheezes / crackles  Cardio:    S1/S2, RRR, No murmure, No Rubs or Gallops   Abdomen:  Soft, non-tender, bowel sounds active all four quadrants, no guarding or  peritoneal signs.  Muscular  skeletal:  Limited exam -global generalized weaknesses - in bed, able to move all 4 extremities,   2+ pulses,  symmetric, No pitting edema  Skin:  Dry, warm to touch, negative for any Rashes,  Wounds: Please see nursing documentation         Condition at discharge: good  The results of significant diagnostics from this hospitalization (including imaging, microbiology, ancillary and laboratory) are listed below for reference.   Imaging Studies: ECHOCARDIOGRAM COMPLETE Result Date: 12/24/2023    ECHOCARDIOGRAM REPORT   Patient Name:   Carly Wood Date of Exam: 12/24/2023 Medical Rec #:  [id number]      Height:       63.0 in Accession #:    [id number]     Weight:       146.0 lb Date of Birth:  [birth year]     BSA:          1.692 m Patient Age:    87 years       BP:           160/90 mmHg Patient Gender: F              HR:            88 bpm. Exam Location:  [facility] Procedure: 2D Echo, Cardiac Doppler and Color Doppler (Both Spectral and Color            Flow Doppler were utilized during procedure). Indications:    Dyspnea R06.00  History:        Patient has prior history of Echocardiogram examinations, most                 recent 05/25/2023. Aortic Valve Disease; Risk                 Factors:Hypertension, Diabetes and Dyslipidemia. Hx of breast                 cancer. ESRD on dialysis.  Sonographer:    Aida Pizza RCS Referring Phys: [id number] SANTOSH SIGDEL IMPRESSIONS  1. Distal septal , apical akinesis Inferior wall hypokinesis . Left ventricular ejection fraction, by estimation, is 35 to 40%. The left ventricle has moderately decreased function. The left ventricle has no regional wall motion abnormalities. The left ventricular internal cavity size was moderately dilated. There is moderate asymmetric left ventricular hypertrophy of the basal and septal segments. Indeterminate diastolic filling due to E-A fusion.  2. Right ventricular systolic function is normal. The right ventricular size is normal.  3. The mitral valve is degenerative. Trivial mitral valve regurgitation. No evidence of mitral stenosis. Severe mitral annular calcification.  4. Low flow low gradient AS Although DVI 0.35 AVA 0.84 and EF is low Overall likely severe AS Morphologically valve is very calcified with restricted motion . The aortic valve is tricuspid. There is severe calcifcation of the aortic valve. There is severe thickening of the aortic valve. Aortic valve regurgitation is not visualized. Severe aortic valve stenosis.  5. The inferior vena cava is normal in size with greater than 50% respiratory variability, suggesting right atrial pressure of 3 mmHg. FINDINGS  Left Ventricle: Distal septal , apical akinesis Inferior wall hypokinesis. Left ventricular ejection fraction, by estimation, is 35 to 40%. The left ventricle has moderately decreased function. The left  ventricle has no regional wall motion abnormalities. Strain was performed and the global longitudinal strain is indeterminate. The left ventricular internal cavity size was moderately dilated. There is  moderate asymmetric left ventricular hypertrophy of the basal and septal segments. Indeterminate diastolic filling due to E-A fusion. Right Ventricle: The right ventricular size is normal. No increase in right ventricular wall thickness. Right ventricular systolic function is normal. Left Atrium: Left atrial size was normal in size. Right Atrium: Right atrial size was normal in size. Pericardium: There is no evidence of pericardial effusion. Mitral Valve: The mitral valve is degenerative in appearance. There is severe thickening of the mitral valve leaflet(s). There is severe calcification of the mitral valve leaflet(s). Severe mitral annular calcification. Trivial mitral valve regurgitation. No evidence of mitral valve stenosis. MV peak gradient, 9.6 mmHg. The mean mitral valve gradient is 2.0 mmHg. Tricuspid Valve: The tricuspid valve is normal in structure. Tricuspid valve regurgitation is mild . No evidence of tricuspid stenosis. Aortic Valve: Low flow low gradient AS Although DVI 0.35 AVA 0.84 and EF is low Overall likely severe AS Morphologically valve is very calcified with restricted motion. The aortic valve is tricuspid. There is severe calcifcation of the aortic valve. There is severe thickening of the aortic valve. Aortic valve regurgitation is not visualized. Severe aortic stenosis is present. Aortic valve mean gradient measures 15.0 mmHg. Aortic valve peak gradient measures 25.9 mmHg. Aortic valve area, by VTI measures 0.78 cm. Pulmonic Valve: The pulmonic valve was normal in structure. Pulmonic valve regurgitation is not visualized. No evidence of pulmonic stenosis. Aorta: The aortic root is normal in size and structure. Venous: The inferior vena cava is normal in size with greater than 50% respiratory  variability, suggesting right atrial pressure of 3 mmHg. IAS/Shunts: No atrial level shunt detected by color flow Doppler. Additional Comments: 3D was performed not requiring image post processing on an independent workstation and was indeterminate.  LEFT VENTRICLE PLAX 2D LVIDd:         4.00 cm LVIDs:         2.90 cm LV PW:         1.30 cm LV IVS:        1.40 cm LVOT diam:     1.70 cm LV SV:         41 LV SV Index:   24 LVOT Area:     2.27 cm  RIGHT VENTRICLE RV S prime:     15.70 cm/s TAPSE (M-mode): 3.3 cm LEFT ATRIUM             Index        RIGHT ATRIUM           Index LA diam:        3.60 cm 2.13 cm/m   RA Area:     15.90 cm LA Vol (A2C):   45.9 ml 27.13 ml/m  RA Volume:   37.40 ml  22.11 ml/m LA Vol (A4C):   68.0 ml 40.20 ml/m LA Biplane Vol: 61.5 ml 36.36 ml/m  AORTIC VALVE AV Area (Vmax):    0.84 cm AV Area (Vmean):   0.77 cm AV Area (VTI):     0.78 cm AV Vmax:           254.50 cm/s AV Vmean:          177.000 cm/s AV VTI:            0.521 m AV Peak Grad:      25.9 mmHg AV Mean Grad:      15.0 mmHg LVOT Vmax:         94.20 cm/s LVOT Vmean:  59.900 cm/s LVOT VTI:          0.180 m LVOT/AV VTI ratio: 0.35  AORTA Ao Root diam: 3.40 cm MITRAL VALVE MV Area (PHT): 4.49 cm     SHUNTS MV Area VTI:   1.25 cm     Systemic VTI:  0.18 m MV Peak grad:  9.6 mmHg     Systemic Diam: 1.70 cm MV Mean grad:  2.0 mmHg MV Vmax:       1.55 m/s MV Vmean:      64.8 cm/s MV Decel Time: 169 msec MV E velocity: 149.00 cm/s Maude Emmer MD Electronically signed by Maude Emmer MD Signature Date/Time: 12/24/2023/4:18:22 PM    Final    CT Chest Wo Contrast Addendum Date: 12/23/2023 ADDENDUM REPORT: 12/23/2023 16:10 ADDENDUM: These results were called by telephone at the time of interpretation on 12/23/2023 at 4:08 pm to provider Casimir Shan , who verbally acknowledged these results. Electronically Signed   By: Juliene Balder M.D.   On: 12/23/2023 16:10   Result Date: 12/23/2023 CLINICAL DATA:  Possible  pneumonia.  Respiratory failure. EXAM: CT CHEST WITHOUT CONTRAST TECHNIQUE: Multidetector CT imaging of the chest was performed following the standard protocol without IV contrast. RADIATION DOSE REDUCTION: This exam was performed according to the departmental dose-optimization program which includes automated exposure control, adjustment of the mA and/or kV according to patient size and/or use of iterative reconstruction technique. COMPARISON:  10/01/2021 FINDINGS: Cardiovascular: Diffuse coronary artery calcifications. Left jugular dialysis catheter with the tip near the superior cavoatrial junction. Heart size is normal without significant pericardial effusion. Atherosclerotic calcifications in thoracic aorta. Atherosclerotic calcifications in the abdominal aorta. Mediastinum/Nodes: No significant lymph node enlargement in the mediastinum. Limited evaluation of the hilar regions without intravascular contrast. No significant axillary lymph node enlargement. Prominent venous structures in the neck. Limited evaluation for supraclavicular lymph node enlargement. Lungs/Pleura: No pleural effusions. Trachea and mainstem bronchi are patent. Volume loss in both lower lobes. No focal airspace disease. Upper Abdomen: Kidneys appear to be atrophic. Evidence for bilateral renal cysts. Extensive vascular calcifications involving the both kidneys. Probable stone in the left kidney lower pole. No acute abnormality in the visualized abdomen. Musculoskeletal: Severe subcutaneous edema throughout the left lower chest and left breast tissue. The subcutaneous edema appears to be centered around the left breast tissue. Prominent soft tissue along the surface of the left breast. Neoplastic process in left breast cannot be excluded. There are old vertebral body compression deformities involving T12, L1 and L2. IMPRESSION: 1. Extensive subcutaneous edema along the left side of the chest and left breast. Soft tissue fullness involving  the left breast tissue. Neoplastic process cannot be excluded. Recommend clinical correlation in this area and consider mammography. 2. Mild volume loss at the lung bases. Otherwise, no acute lung abnormalities. 3. Dialysis catheter. 4.  Aortic Atherosclerosis (ICD10-I70.0). These results will be called to the ordering clinician or representative by the Radiologist Assistant, and communication documented in the PACS or [employer]. Electronically Signed: By: Juliene Balder M.D. On: 12/23/2023 15:56   DG Chest Portable 1 View Result Date: 12/23/2023 CLINICAL DATA:  ams, cough, fever EXAM: PORTABLE CHEST - 1 VIEW COMPARISON:  03/16/2023 FINDINGS: Left IJ approach hemodialysis catheter in place terminating in the high right atrium, unchanged. Low lung volumes. No focal airspace consolidation, pleural effusion, or pneumothorax. Moderate cardiomegaly. Tortuous aorta with aortic atherosclerosis. No acute fracture or destructive lesions. Multilevel thoracic osteophytosis. Surgical clips in the right breast/axilla. IMPRESSION: Low lung volumes.  Otherwise, no acute cardiopulmonary abnormality. Electronically Signed   By: Rogelia Myers M.D.   On: 12/23/2023 13:47    Microbiology: Results for orders placed or performed during the hospital encounter of 12/23/23  Resp panel by RT-PCR (RSV, Flu A&B, Covid) Anterior Nasal Swab     Status: None   Collection Time: 12/23/23 12:39 PM   Specimen: Anterior Nasal Swab  Result Value Ref Range Status   SARS Coronavirus 2 by RT PCR NEGATIVE NEGATIVE Final    Comment: (NOTE) SARS-CoV-2 target nucleic acids are NOT DETECTED.  The SARS-CoV-2 RNA is generally detectable in upper respiratory specimens during the acute phase of infection. The lowest concentration of SARS-CoV-2 viral copies this assay can detect is 138 copies/mL. A negative result does not preclude SARS-Cov-2 infection and should not be used as the sole basis for treatment or other patient management  decisions. A negative result may occur with  improper specimen collection/handling, submission of specimen other than nasopharyngeal swab, presence of viral mutation(s) within the areas targeted by this assay, and inadequate number of viral copies(<138 copies/mL). A negative result must be combined with clinical observations, patient history, and epidemiological information. The expected result is Negative.  Fact Sheet for Patients:  BloggerCourse.com  Fact Sheet for Healthcare Providers:  SeriousBroker.it  This test is no t yet approved or cleared by the [place]  FDA and  has been authorized for detection and/or diagnosis of SARS-CoV-2 by FDA under an Emergency Use Authorization (EUA). This EUA will remain  in effect (meaning this test can be used) for the duration of the COVID-19 declaration under Section 564(b)(1) of the Act, 21 U.S.C.section 360bbb-3(b)(1), unless the authorization is terminated  or revoked sooner.       Influenza A by PCR NEGATIVE NEGATIVE Final   Influenza B by PCR NEGATIVE NEGATIVE Final    Comment: (NOTE) The Xpert Xpress SARS-CoV-2/FLU/RSV plus assay is intended as an aid in the diagnosis of influenza from Nasopharyngeal swab specimens and should not be used as a sole basis for treatment. Nasal washings and aspirates are unacceptable for Xpert Xpress SARS-CoV-2/FLU/RSV testing.  Fact Sheet for Patients: BloggerCourse.com  Fact Sheet for Healthcare Providers: SeriousBroker.it  This test is not yet approved or cleared by the [place]  FDA and has been authorized for detection and/or diagnosis of SARS-CoV-2 by FDA under an Emergency Use Authorization (EUA). This EUA will remain in effect (meaning this test can be used) for the duration of the COVID-19 declaration under Section 564(b)(1) of the Act, 21 U.S.C. section 360bbb-3(b)(1), unless the  authorization is terminated or revoked.     Resp Syncytial Virus by PCR NEGATIVE NEGATIVE Final    Comment: (NOTE) Fact Sheet for Patients: BloggerCourse.com  Fact Sheet for Healthcare Providers: SeriousBroker.it  This test is not yet approved or cleared by the [place]  FDA and has been authorized for detection and/or diagnosis of SARS-CoV-2 by FDA under an Emergency Use Authorization (EUA). This EUA will remain in effect (meaning this test can be used) for the duration of the COVID-19 declaration under Section 564(b)(1) of the Act, 21 U.S.C. section 360bbb-3(b)(1), unless the authorization is terminated or revoked.  Performed at [facility], [address], [place], [place] [address]   Culture, blood (Routine x 2)     Status: None (Preliminary result)   Collection Time: 12/23/23 12:50 PM   Specimen: BLOOD  Result Value Ref Range Status   Specimen Description BLOOD BLOOD RIGHT ARM  Final   Special Requests  Final    BOTTLES DRAWN AEROBIC AND ANAEROBIC Blood Culture adequate volume   Culture   Final    NO GROWTH 4 DAYS Performed at [facility], [address], [place], [place] [address]    Report Status PENDING  Incomplete  Culture, blood (Routine x 2)     Status: None (Preliminary result)   Collection Time: 12/23/23 12:50 PM   Specimen: BLOOD  Result Value Ref Range Status   Specimen Description BLOOD BLOOD RIGHT ARM  Final   Special Requests   Final    BOTTLES DRAWN AEROBIC AND ANAEROBIC Blood Culture results may not be optimal due to an inadequate volume of blood received in culture bottles   Culture   Final    NO GROWTH 4 DAYS Performed at [facility], [address] [place], [place] [address]    Report Status PENDING  Incomplete  MRSA Next Gen by PCR, Nasal     Status: None   Collection Time: 12/23/23  7:19 PM   Specimen: Nasal Mucosa; Nasal Swab  Result Value Ref Range Status   MRSA by PCR Next Gen NOT  DETECTED NOT DETECTED Final    Comment: (NOTE) The GeneXpert MRSA Assay (FDA approved for NASAL specimens only), is one component of a comprehensive MRSA colonization surveillance program. It is not intended to diagnose MRSA infection nor to guide or monitor treatment for MRSA infections. Test performance is not FDA approved in patients less than 44 years old. Performed at [facility], [address] [place], [place] [address]    *Note: Due to a large number of results and/or encounters for the requested time period, some results have not been displayed. A complete set of results can be found in Results Review.    Labs: CBC: Recent Labs  Lab 12/23/23 1250 12/23/23 1453 12/25/23 1040 12/27/23 0941  WBC 8.2 7.5 5.7 5.8  NEUTROABS 7.3  --   --   --   HGB 10.7* 9.7* 9.0* 9.2*  HCT 33.7* 30.1* 28.6* 28.5*  MCV 107.7* 106.4* 108.3* 106.3*  PLT 234 196 198 195   Basic Metabolic Panel: Recent Labs  Lab 12/23/23 1250 12/25/23 1040 12/27/23 0941  NA 140 134* 134*  K 3.3* 3.9 4.1  CL 94* 92* 92*  CO2 30 25 24   GLUCOSE 91 161* 163*  BUN 18 48* 52*  CREATININE 5.39* 6.90* 6.89*  CALCIUM  10.5* 9.2 9.5  PHOS  --   --  3.1   Liver Function Tests: Recent Labs  Lab 12/23/23 1250 12/27/23 0941  AST 25  --   ALT 15  --   ALKPHOS 96  --   BILITOT 0.4  --   PROT 8.4*  --   ALBUMIN  4.8 4.2   CBG: Recent Labs  Lab 12/23/23 1246  GLUCAP 96    Discharge time spent: greater than 30 minutes.  Signed: Adriana DELENA Grams, MD [facility] 12/27/2023

## 2023-12-27 NOTE — Progress Notes (Signed)
 Patient ID: Carly Wood, female   DOB: [birth year], 88 y.o.   MRN: [id number] S: Feels alright today and asking when she will be leaving.  No events overnight. O:BP (!) 112/59 (BP Location: Right Arm)   Pulse 73   Temp (!) 97.5 F (36.4 C) (Oral)   Resp 17   Ht 5' 3 (1.6 m)   Wt 65 kg   SpO2 100%   BMI 25.38 kg/m   Intake/Output Summary (Last 24 hours) at 12/27/2023 0839 Last data filed at 12/26/2023 1822 Gross per 24 hour  Intake 1440 ml  Output --  Net 1440 ml   Intake/Output: I/O last 3 completed shifts: In: 1440 [P.O.:1440] Out: -   Intake/Output this shift:  No intake/output data recorded. Weight change: 1.9 kg Gen: NAD CVS: RRR Resp: CTA Abd: +BS,soft, NT/ND Ext: no edema  Recent Labs  Lab 12/23/23 1250 12/25/23 1040  NA 140 134*  K 3.3* 3.9  CL 94* 92*  CO2 30 25  GLUCOSE 91 161*  BUN 18 48*  CREATININE 5.39* 6.90*  ALBUMIN  4.8  --   CALCIUM  10.5* 9.2  AST 25  --   ALT 15  --    Liver Function Tests: Recent Labs  Lab 12/23/23 1250  AST 25  ALT 15  ALKPHOS 96  BILITOT 0.4  PROT 8.4*  ALBUMIN  4.8   No results for input(s): LIPASE, AMYLASE in the last 168 hours. No results for input(s): AMMONIA in the last 168 hours. CBC: Recent Labs  Lab 12/23/23 1250 12/23/23 1453 12/25/23 1040  WBC 8.2 7.5 5.7  NEUTROABS 7.3  --   --   HGB 10.7* 9.7* 9.0*  HCT 33.7* 30.1* 28.6*  MCV 107.7* 106.4* 108.3*  PLT 234 196 198   Cardiac Enzymes: No results for input(s): CKTOTAL, CKMB, CKMBINDEX, TROPONINI in the last 168 hours. CBG: Recent Labs  Lab 12/23/23 1246  GLUCAP 96    Iron  Studies: No results for input(s): IRON , TIBC, TRANSFERRIN, FERRITIN in the last 72 hours. Studies/Results: No results found.  acyclovir   200 mg Oral q AM   aspirin  EC  81 mg Oral Daily   atorvastatin   40 mg Oral QPM   budesonide (PULMICORT) nebulizer solution  0.5 mg Nebulization BID   Chlorhexidine  Gluconate Cloth  6 each Topical Q0600    Chlorhexidine  Gluconate Cloth  6 each Topical Q0600   Chlorhexidine  Gluconate Cloth  6 each Topical Q0600   dextromethorphan-guaiFENesin  1 tablet Oral BID   heparin   5,000 Units Subcutaneous Q8H   ipratropium-albuterol   3 mL Nebulization TID   methylPREDNISolone  (SOLU-MEDROL ) injection  40 mg Intravenous Q12H   pantoprazole   40 mg Oral BID   sucralfate   1 g Oral BID    BMET    Component Value Date/Time   NA 134 (L) 12/25/2023 1040   K 3.9 12/25/2023 1040   CL 92 (L) 12/25/2023 1040   CO2 25 12/25/2023 1040   GLUCOSE 161 (H) 12/25/2023 1040   BUN 48 (H) 12/25/2023 1040   CREATININE 6.90 (H) 12/25/2023 1040   CALCIUM  9.2 12/25/2023 1040   CALCIUM  9.0 10/18/2018 1059   GFRNONAA 5 (L) 12/25/2023 1040   GFRAA 5 (L) 12/05/2019 1241   CBC    Component Value Date/Time   WBC 5.7 12/25/2023 1040   RBC 2.64 (L) 12/25/2023 1040   HGB 9.0 (L) 12/25/2023 1040   HCT 28.6 (L) 12/25/2023 1040   PLT 198 12/25/2023 1040   MCV 108.3 (H) 12/25/2023 1040  MCH 34.1 (H) 12/25/2023 1040   MCHC 31.5 12/25/2023 1040   RDW 14.6 12/25/2023 1040   LYMPHSABS 0.4 (L) 12/23/2023 1250   MONOABS 0.4 12/23/2023 1250   EOSABS 0.1 12/23/2023 1250   BASOSABS 0.0 12/23/2023 1250    Outpatient HD orders: [facility] MWF EDW 66.5 kg  3 hrs 45 minutes BF 350 ml/min 500 ml/min Tunneled catheter for HD Bath: 1K/2.5 Ca  Meds:  Calcitriol  1.5 mcg every tx Hourly heparin  at 1000 units/hr and a loading dose of 1000 units/hr Mircera 50 mcg every 2 weeks - just got on Monday 12/20/23 Venofer 50 mg weekly  Last post weight - 66.5 kg (EDW just lowered to 66.5 after she met that weight on Wed)     Assessment/Plan:     # Fever and Cough  - Concerning for possible pneumonia  - Cefepime  per primary team   - respiratory panel negative for covid-19, influenza A/V, and RSV   # Acute hypoxic respiratory failure  - optimize volume status with HD  - supplemental oxygen  - Subcutaneous edema on left side of  chest and left breast and per radiology neoplastic process cannot be excluded    # ESRD  - usually gets HD on MWF schedule - HD on 10/16 off schedule and again on 12/25/23. - Plan for HD today to transition back to MWF schedule  - note she's on a 1K bath outpatient - will order a 2K bath here and I would suggest changing to 2K bath outpatient if her trends here continue - She is below her outpatient edw and may need to be adjusted further upon discharge.   # Anemia of CKD  - note recently rec'd ESA    # Metabolic bone disease - on calcitriol  usually - will hold given her calcium  (was 10.5 now 9.2)    # Multiple myeloma  - Per primary team and outpatient hem/onc   # Dementia - Charted diagnosis  - Appreciate staff supplementing her history    Disposition - per primary team.    Fairy RONAL Sellar, MD [facility]

## 2023-12-27 NOTE — Procedures (Signed)
 Received patient in bed to unit.  Alert and oriented.  Informed consent signed and in chart. LIJ tunneled dual lumen catheter accessed per policy, labs drawn, labeled and lab notified. Tx initiated per MD order. UF goal 2500 ml.    TX duration:3.5 hours  Tx complete. Blood returned. Dual lumen catheter flushed with NS, hepsrin left to dwell. Caps and clamps in place.  Patient tolerated well.  Transported back to the room  Alert, without acute distress.  Hand-off given to patient's nurse.   Access used: LIJ tunneled dual lumen catheter Access issues: none  Total UF removed: 2500 ml Medication(s) given: See MAR    [facility] LITTIE [facility]

## 2023-12-27 NOTE — TOC Transition Note (Signed)
 Transition of Care [facility]) - Discharge Note   Patient Details  Name: Carly Wood MRN: [id number] Date of Birth: 1935/06/02  Transition [facility]) CM/SW Contact:  Hoy DELENA Bigness, LCSW Phone Number: 12/27/2023, 11:57 AM   Clinical Narrative:    Pt to return to her LTC resident at [facility] following HD. RN to call report to [phone]. VM left for pt's sister to inform of discharge.    Final next level of care: Long Term Nursing Home Barriers to Discharge: Barriers Resolved   Patient Goals and CMS Choice Patient states their goals for this hospitalization and ongoing recovery are:: For pt to return to her LTC at [facility] CMS Medicare.gov Compare Post Acute Care list provided to:: Patient Represenative (must comment) Choice offered to / list presented to : Adult Children [place] ownership interest in [facility].provided to:: Adult Children    Discharge Placement              Patient chooses bed at: [facility]   Name of family member notified: Sister Patient and family notified of of transfer: 12/27/23  Discharge Plan and Services Additional resources added to the After Visit Summary for   In-house Referral: Clinical Social Work Discharge Planning Services: NA Post Acute Care Choice: Nursing Home          DME Arranged: N/A DME Agency: NA                  Social Drivers of Health (SDOH) Interventions SDOH Screenings   Food Insecurity: Patient Unable To Answer (12/24/2023)  Housing: Patient Unable To Answer (12/24/2023)  Transportation Needs: Patient Unable To Answer (12/24/2023)  Utilities: Patient Unable To Answer (12/24/2023)  Alcohol  Screen: Low Risk  (01/09/2020)  Depression (PHQ2-9): Low Risk  (12/16/2023)  Financial Resource Strain: Low Risk  (01/09/2020)  Physical Activity: Inactive (01/09/2020)  Social Connections: Patient Unable To Answer (12/24/2023)  Stress: No Stress Concern Present (01/09/2020)  Tobacco Use: Low Risk   (12/23/2023)     Readmission Risk Interventions    12/27/2023   11:56 AM 12/24/2023    9:04 AM 05/25/2023    9:03 AM  Readmission Risk Prevention Plan  Transportation Screening Complete Complete Complete  PCP or Specialist Appt within 5-7 Days  Complete   PCP or Specialist Appt within 3-5 Days Complete    Home Care Screening  Complete   Medication Review (RN CM)  Complete   HRI or Home Care Consult Complete  Complete  Social Work Consult for Recovery Care Planning/Counseling Complete  Complete  Palliative Care Screening Not Applicable  Not Applicable  Medication Review Oceanographer) Complete  Complete

## 2023-12-28 ENCOUNTER — Encounter: Payer: Self-pay | Admitting: Adult Health

## 2023-12-28 ENCOUNTER — Non-Acute Institutional Stay (SKILLED_NURSING_FACILITY): Payer: Self-pay | Admitting: Adult Health

## 2023-12-28 DIAGNOSIS — F01518 Vascular dementia, unspecified severity, with other behavioral disturbance: Secondary | ICD-10-CM

## 2023-12-28 DIAGNOSIS — C50911 Malignant neoplasm of unspecified site of right female breast: Secondary | ICD-10-CM

## 2023-12-28 DIAGNOSIS — I5032 Chronic diastolic (congestive) heart failure: Secondary | ICD-10-CM | POA: Diagnosis not present

## 2023-12-28 DIAGNOSIS — E1169 Type 2 diabetes mellitus with other specified complication: Secondary | ICD-10-CM

## 2023-12-28 DIAGNOSIS — J9601 Acute respiratory failure with hypoxia: Secondary | ICD-10-CM | POA: Diagnosis not present

## 2023-12-28 DIAGNOSIS — E43 Unspecified severe protein-calorie malnutrition: Secondary | ICD-10-CM

## 2023-12-28 DIAGNOSIS — I700 Atherosclerosis of aorta: Secondary | ICD-10-CM

## 2023-12-28 DIAGNOSIS — N186 End stage renal disease: Secondary | ICD-10-CM

## 2023-12-28 DIAGNOSIS — E1122 Type 2 diabetes mellitus with diabetic chronic kidney disease: Secondary | ICD-10-CM

## 2023-12-28 DIAGNOSIS — K219 Gastro-esophageal reflux disease without esophagitis: Secondary | ICD-10-CM | POA: Diagnosis not present

## 2023-12-28 DIAGNOSIS — C9000 Multiple myeloma not having achieved remission: Secondary | ICD-10-CM

## 2023-12-28 DIAGNOSIS — Z992 Dependence on renal dialysis: Secondary | ICD-10-CM

## 2023-12-28 DIAGNOSIS — I120 Hypertensive chronic kidney disease with stage 5 chronic kidney disease or end stage renal disease: Secondary | ICD-10-CM

## 2023-12-28 DIAGNOSIS — E785 Hyperlipidemia, unspecified: Secondary | ICD-10-CM

## 2023-12-28 LAB — CULTURE, BLOOD (ROUTINE X 2)
Culture: NO GROWTH
Culture: NO GROWTH
Special Requests: ADEQUATE

## 2023-12-28 NOTE — Progress Notes (Signed)
 Location:  [facility] Home Room Number: 149/W Place of Service:  SNF (31)   CODE STATUS: DNR  Allergies  Allergen Reactions   Ace Inhibitors Other (See Comments) and Cough    Tongue swelling Angioedema    Angiotensin Receptor Blockers Other (See Comments)    Angioedema with ACE-I   Penicillins Other (See Comments)    Unknown reaction    Chief Complaint  Patient presents with   Hospitalization Follow-up    Hospital follow-up    HPI:  Carly Wood is a 88 year old long term resident of this facility who has been hospitalized from 12-23-23 through 12-27-23. Her past medical history includes: multiple myeloma; diabetes type 2; ESRD on hemodialysis; breast cancer; dementia. Carly Wood had been to have her chemo treatment was found to have congestion with wheezing present and a fever. Per oncology request Carly Wood was taken to the ED for further evaluation and treatment. Carly Wood was initially placed on 02 then transitioned to bipap due to worsening hypoxia. Carly Wood was placed on IV ceftriaxone and doxycycline . And was placed on prednisone  taper with mucinex for 5 more days and levaquin  for 3 more days for acute hypoxic respiratory failure. Carly Wood will continue to be followed for her chronic illnesses including: Anemia due to end stage renal disease:  Post menopausal osteoporosis Stage 1 infiltrating ductal carcinoma of female right breast:   Past Medical History:  Diagnosis Date   Anemia     chronic macrocytic anemia   Anxiety    Chronic kidney disease    Chronic renal disease, stage 4, severely decreased glomerular filtration rate (GFR) between 15-29 mL/min/1.73 square meter (HCC) 08/22/2015   Complication of anesthesia    delirious after Breast Surgery   Dementia (HCC)    mild   Depression    Diabetes mellitus with ESRD (end-stage renal disease) (HCC)    type II   Dysphagia    Dyspnea    with activity   GERD (gastroesophageal reflux disease)    Glaucoma    Hyperlipidemia     Hypertension    Multiple myeloma (HCC)    Pneumonia    Stage 1 infiltrating ductal carcinoma of right female breast (HCC) 08/21/2015   ER+ PR+ HER 2 neu + (3+) T1cN0     Past Surgical History:  Procedure Laterality Date   A/V FISTULAGRAM Right 07/05/2020   Procedure: A/V Fistulagram;  Surgeon: Harvey Carlin BRAVO, MD;  [facility];  Service: Cardiovascular;  Laterality: Right;   AV FISTULA PLACEMENT Left 11/22/2017   Procedure: ARTERIOVENOUS (AV) FISTULA CREATION LEFT ARM;  Surgeon: Harvey Carlin BRAVO, MD;  Location: [facility] OR;  Service: Vascular;  Laterality: Left;   AV FISTULA PLACEMENT Right 04/04/2020   Procedure: RIGHT ARM ARTERIOVENOUS FISTULA CREATION;  Surgeon: Oris Krystal FALCON, MD;  Location: [facility] ORS;  Service: Vascular;  Laterality: Right;   AV FISTULA PLACEMENT Right 08/20/2020   Procedure: ARTERIOVENOUS (AV) FISTULA LIGATION RIGHT ARM;  Surgeon: Harvey Carlin BRAVO, MD;  Location: [facility] OR;  Service: Vascular;  Laterality: Right;   BIOPSY  08/07/2016   Procedure: BIOPSY;  Surgeon: Shaaron [name], MD;  Location: [facility];  Service: Endoscopy;;  gastric ulcer biopsy   COLONOSCOPY     ESOPHAGOGASTRODUODENOSCOPY N/A 08/07/2016   LA Grade A esophagitis s/p dilation, small hiatal hernia, multiple gastric ulcers and erosions, duodenal erosions s/p biopsy. Negative H.pylori    ESOPHAGOGASTRODUODENOSCOPY N/A 11/27/2016   normal esophagus, previously noted gastric ulcers completely healed, normal duodenum.  ESOPHAGOGASTRODUODENOSCOPY (EGD) WITH PROPOFOL  N/A 07/23/2020   Procedure: ESOPHAGOGASTRODUODENOSCOPY (EGD) WITH PROPOFOL ;  Surgeon: Golda Claudis PENNER, MD;  [facility];  Service: Endoscopy;  Laterality: N/A;   FISTULA SUPERFICIALIZATION Left 02/14/2018   Procedure: FISTULA SUPERFICIALIZATION LEFT ARM;  Surgeon: Eliza Lonni RAMAN, MD;  Location: [facility] OR;  Service: Vascular;  Laterality: Left;   FRACTURE SURGERY Right    ankle   HOT HEMOSTASIS  07/23/2020    Procedure: HOT HEMOSTASIS (ARGON PLASMA COAGULATION/BICAP);  Surgeon: Golda Claudis PENNER, MD;  [facility];  Service: Endoscopy;;   INTRAMEDULLARY (IM) NAIL INTERTROCHANTERIC Right 07/12/2020   Procedure: INTRAMEDULLARY (IM) NAIL INTERTROCHANTRIC;  Surgeon: Onesimo Oneil LABOR, MD;  Location: [facility];  Service: Orthopedics;  Laterality: Right;   IR FLUORO GUIDE CV LINE LEFT  12/21/2022   IR US  GUIDE VASC ACCESS LEFT  12/21/2022   MALONEY DILATION N/A 08/07/2016   Procedure: AGAPITO DILATION;  Surgeon: Shaaron [name], MD;  [facility];  Service: Endoscopy;  Laterality: N/A;   MASTECTOMY, PARTIAL Right    PERIPHERAL VASCULAR BALLOON ANGIOPLASTY Left 07/13/2019   Procedure: PERIPHERAL VASCULAR BALLOON ANGIOPLASTY;  Surgeon: Gretta Lonni PARAS, MD;  [facility];  Service: Cardiovascular;  Laterality: Left;  arm fistulogram   PERIPHERAL VASCULAR BALLOON ANGIOPLASTY Right 05/22/2020   Procedure: PERIPHERAL VASCULAR BALLOON ANGIOPLASTY;  Surgeon: Magda Debby SAILOR, MD;  [facility];  Service: Cardiovascular;  Laterality: Right;  arm fistula   PERIPHERAL VASCULAR BALLOON ANGIOPLASTY Right 07/05/2020   Procedure: PERIPHERAL VASCULAR BALLOON ANGIOPLASTY;  Surgeon: Harvey Carlin BRAVO, MD;  [facility];  Service: Cardiovascular;  Laterality: Right;  arm fistula   PORT-A-CATH REMOVAL Left 11/22/2017   Procedure: REMOVAL PORT-A-CATH LEFT CHEST;  Surgeon: Harvey Carlin BRAVO, MD;  Location: [facility] OR;  Service: Vascular;  Laterality: Left;   RETINAL DETACHMENT SURGERY Right    SCLEROTHERAPY  07/23/2020   Procedure: SCLEROTHERAPY;  Surgeon: Golda Claudis PENNER, MD;  [facility];  Service: Endoscopy;;   TUNNELLED CATHETER EXCHANGE N/A 08/16/2023   Procedure: TUNNELLED CATHETER EXCHANGE;  Surgeon: Magda Debby SAILOR, MD;  Location: [facility];  Service: Cardiovascular;  Laterality: N/A;   UPPER EXTREMITY VENOGRAPHY N/A 08/22/2020   Procedure:  LEFT UPPER & CENTRAL VENOGRAPHY;  Surgeon: Gretta Lonni PARAS, MD;  [facility];  Service: Cardiovascular;  Laterality: N/A;    Social History   Socioeconomic History   Marital status: Single    Spouse name: Not on file   Number of children: Not on file   Years of education: Not on file   Highest education level: Not on file  Occupational History   Occupation: retired   Tobacco Use   Smoking status: Never   Smokeless tobacco: Never  Vaping Use   Vaping status: Never Used  Substance and Sexual Activity   Alcohol  use: No    Alcohol /week: 0.0 standard drinks of alcohol    Drug use: No   Sexual activity: Never  Other Topics Concern   Not on file  Social History Narrative   Long term resident of SNF    Social Drivers of Health   Financial Resource Strain: Low Risk  (01/09/2020)   Overall Financial Resource Strain (CARDIA)    Difficulty of Paying Living Expenses: Not very hard  Food Insecurity: Patient Unable To Answer (12/24/2023)   Hunger Vital Sign    Worried About Running Out of Food in the Last Year: Patient unable  to answer    Ran Out of Food in the Last Year: Patient unable to answer  Transportation Needs: Patient Unable To Answer (12/24/2023)   PRAPARE - Transportation    Lack of Transportation (Medical): Patient unable to answer    Lack of Transportation (Non-Medical): Patient unable to answer  Physical Activity: Inactive (01/09/2020)   Exercise Vital Sign    Days of Exercise per Week: 0 days    Minutes of Exercise per Session: 0 min  Stress: No Stress Concern Present (01/09/2020)   [employer] of Occupational Health - Occupational Stress Questionnaire    Feeling of Stress : Not at all  Social Connections: Patient Unable To Answer (12/24/2023)   Social Connection and Isolation Panel    Frequency of Communication with Friends and Family: Patient unable to answer    Frequency of Social Gatherings with Friends and Family: Patient unable to  answer    Attends Religious Services: Patient unable to answer    Active Member of Clubs or Organizations: Patient unable to answer    Attends Banker Meetings: Patient unable to answer    Marital Status: Patient unable to answer  Intimate Partner Violence: Patient Unable To Answer (12/24/2023)   Humiliation, Afraid, Rape, and Kick questionnaire    Fear of Current or Ex-Partner: Patient unable to answer    Emotionally Abused: Patient unable to answer    Physically Abused: Patient unable to answer    Sexually Abused: Patient unable to answer   Family History  Problem Relation Age of Onset   Multiple myeloma Sister    Brain cancer Sister    Dementia Mother        died at 12   Stroke Mother    Heart failure Mother    Diabetes Mother    Heart disease Father    Prostate cancer Brother    Colon cancer Neg Hx       VITAL SIGNS BP 107/63   Pulse 86   Temp 98.7 F (37.1 C)   Resp (!) 24   Ht 5' 2 (1.575 m)   Wt 135 lb 8 oz (61.5 kg)   SpO2 95%   BMI 24.78 kg/m   Outpatient Encounter Medications as of 12/28/2023  Medication Sig   acetaminophen  (TYLENOL ) 325 MG tablet Take 650 mg by mouth 3 (three) times daily.   acyclovir  (ZOVIRAX ) 200 MG capsule Take 200 mg by mouth in the morning. (0800)   aspirin  EC 81 MG tablet Take 1 tablet (81 mg total) by mouth daily. Swallow whole.   atorvastatin  (LIPITOR) 40 MG tablet Take 1 tablet (40 mg total) by mouth daily. (Patient taking differently: Take 40 mg by mouth every evening.)   Calcium  Carb-Cholecalciferol  (CALCIUM  + VITAMIN D3) 500-5 MG-MCG TABS Take 1 tablet by mouth daily.   dextromethorphan-guaiFENesin (MUCINEX DM) 30-600 MG 12hr tablet Take 1 tablet by mouth 2 (two) times daily for 5 days.   levofloxacin  (LEVAQUIN ) 250 MG tablet Take 1 tablet (250 mg total) by mouth daily for 3 days.   melatonin 5 MG TABS Take 5 mg by mouth at bedtime.   methylPREDNISolone  (MEDROL  DOSEPAK) 4 MG TBPK tablet Medrol  Dosepak take as  instructed   multivitamin (RENA-VIT) TABS tablet Take 1 tablet by mouth at bedtime.   ondansetron  (ZOFRAN -ODT) 4 MG disintegrating tablet Take 4 mg by mouth daily before breakfast.   pantoprazole  (PROTONIX ) 40 MG tablet Take 40 mg by mouth 2 (two) times daily.   sennosides-docusate sodium (SENOKOT-S) 8.6-50 MG  tablet Take 1 tablet by mouth 2 (two) times daily.   sevelamer  carbonate (RENVELA ) 800 MG tablet Take 800 mg by mouth daily. (Patient taking differently: Take 800 mg by mouth 3 (three) times daily with meals.)   sucralfate  (CARAFATE ) 1 GM/10ML suspension Take 10 mLs by mouth 2 (two) times daily.      SIGNIFICANT DIAGNOSTIC EXAMS  LABS REVIEWED PREVIOUS      12-16-22: wbc 6.7; hgb 12.4; hct 38.3; mcv 101.3 plt 201; glucose 123; bun 22; creat 4.81; k+ 4.1; na++ 133; ca 9.4; gfr 8; protein 7.9; albumin  4.1; blood culture: staphylococcus hominis  12-22-22: wbc 4.2; hgb 9.8; hct 30.1; mcv 100.0; plt 184; glucose 90; bun 81; creat 12.02; k+ 5.0; na++ 136; ca 8.8; gfr 3; phos 10.8; albumin  3.2 02-16-23: wbc 4.9; hgb 9.2; hct 28.8; mcv 104.7 plt 215 glucose 194; bun 32; creat 8.17; k+ 5.1; na++ 9.8;gfr 4; protein 7.5 albumin  3.9 mag 2.6   05-27-23: uric acid 3.4 07-01-23: wbc 4.1; hgb 11.2; hct 35.2; mcv 101.4 plt 197;glucose 145; bun 22; creat 6.07; k+ 3.8; na++ 135; ca 9.4 gfr 6 protein 7.2 albumin  3.6 mag 2.4  08-26-23: wbc 4.0; hgb 10.5; hct 32.8; mcv 103.5 plt 197; glucose 116; bun 24; creat 5.78; k+ 4.2; na++ 135; ca 9.5 gfr 7; protein 7.2 albumin  3.6 mag 2.3   TODAY  11-11-23: wbc 3.9; hgb 9.8; hct 30.7; mcv 108.5 plt 166; glucose 170; bun 22; creat 5.47; k+ 4.0; na++ 136; ca 9.4 gfr 7 protein 7.0 albumin  3.6; mag 2.3  12-23-23: wbc 8.2; hgb 10.7; hct 33.7; mcv 107.7 plt 234; glucose 91; bun 18; creat 5.29; k+ 3.3; na++ 140; ca 10.5; gfr 7 protein 8.4 albumin  4.8; BNP >35,000 12-27-23: wbc 5.8; hgb 9.2; hct 28.5 mcv 106.3 plt 195; glucose 163; bun 52; creat 6.89; k+ 4.1; na++ 134; ca 9.5;  gfr 5; phos 3.1 albumin  4.2   Review of Systems  Constitutional:  Negative for malaise/fatigue.  Respiratory:  Negative for cough and shortness of breath.   Cardiovascular:  Negative for chest pain, palpitations and leg swelling.  Gastrointestinal:  Negative for abdominal pain, constipation and heartburn.  Musculoskeletal:  Negative for back pain, joint pain and myalgias.  Skin: Negative.   Neurological:  Negative for dizziness.  Psychiatric/Behavioral:  The patient is not nervous/anxious.     Physical Exam Constitutional:      General: Carly Wood is not in acute distress.    Appearance: Carly Wood is well-developed. Carly Wood is not diaphoretic.  Neck:     Thyroid : No thyromegaly.  Cardiovascular:     Rate and Rhythm: Normal rate and regular rhythm.     Heart sounds: Normal heart sounds.  Pulmonary:     Effort: Pulmonary effort is normal. No respiratory distress.     Breath sounds: Normal breath sounds.  Abdominal:     General: Bowel sounds are normal. There is no distension.     Palpations: Abdomen is soft.     Tenderness: There is no abdominal tenderness.  Musculoskeletal:        General: Normal range of motion.     Cervical back: Neck supple.     Right lower leg: No edema.     Left lower leg: No edema.  Lymphadenopathy:     Cervical: No cervical adenopathy.  Skin:    General: Skin is warm and dry.     Comments: Tunneled dialysis access   chest wall   Neurological:     Mental Status: Carly Wood  is alert. Mental status is at baseline.  Psychiatric:        Mood and Affect: Mood normal.     ASSESSMENT/PLAN  TODAY  Acute respiratory failure: will complete levaquin , mucinex, and prednisone .   Chronic diastolic congestive heart failure: pro BNP >35,000 while in hospital; will repeat labs EF 35-40%.   Anemia due to end stage renal disease: hgb 9.2  4. Post menopausal osteoporosis t score -2.957 will continue supplements  5. Stage 1 infiltrating ductal carcinoma of female right breast: is  status s/p right mastectomy; the armidex has been stopped in the hospital  6. Multiple myeloma not having achieved remission: is followed by oncology  7. Herpes: without outbreak will continue acyclovir  200 mg daily   8. Major depression chronic recurrent: will continue to monitor    9. Protein calorie malnutrition: protein 7.0; albumin  4.2; will continue supplements as directed  10. Gastroesophageal  reflux disease without esophagitis: will continue protonix  40 mg twice daily carafate  1 gm two times daily; zofran  4 mg prior to breakfast  11. Generalized chronic pain: will continue tylenol  650 mg three times daily   12. Chronic kidney disease with end stage renal disease on dialysis due to type 2 diabetes mellitus/dependence on dialysis: will continue dialysis three times weekly; renvela  800 mg daily with 1200 fluid restriction  13. Hypertension associated with end stage renal disease (ESRD) due to type 2 diabetes mellitus: b/p 107/63 will monitor   14. Hyperlipidemia associated with type 2 diabetes mellitus ldl 89 will continue lipitor 40 mg daily has history of elevated liver enzymes on statin  15. Aortic atherosclerosis/atherosclerotic peripheral vascular disease (ct 10-04-18) ldl 89  16. Vascular dementia with behavioral disturbance: weight is 135 pounds  17. Diabetes type 2 with end stage renal disease (ESRD) hgb A1c 5.1 is off medications.       Barnie Seip NP [facility]  call [phone]

## 2023-12-29 DIAGNOSIS — N186 End stage renal disease: Secondary | ICD-10-CM | POA: Diagnosis not present

## 2023-12-30 ENCOUNTER — Inpatient Hospital Stay: Payer: Self-pay | Admitting: Oncology

## 2023-12-30 ENCOUNTER — Encounter: Payer: Self-pay | Admitting: Internal Medicine

## 2023-12-30 ENCOUNTER — Non-Acute Institutional Stay (SKILLED_NURSING_FACILITY): Payer: Self-pay | Admitting: Internal Medicine

## 2023-12-30 ENCOUNTER — Inpatient Hospital Stay: Payer: Self-pay

## 2023-12-30 DIAGNOSIS — F01518 Vascular dementia, unspecified severity, with other behavioral disturbance: Secondary | ICD-10-CM

## 2023-12-30 DIAGNOSIS — N186 End stage renal disease: Secondary | ICD-10-CM | POA: Diagnosis not present

## 2023-12-30 DIAGNOSIS — J9601 Acute respiratory failure with hypoxia: Secondary | ICD-10-CM | POA: Diagnosis not present

## 2023-12-30 DIAGNOSIS — I120 Hypertensive chronic kidney disease with stage 5 chronic kidney disease or end stage renal disease: Secondary | ICD-10-CM | POA: Diagnosis not present

## 2023-12-30 DIAGNOSIS — Z853 Personal history of malignant neoplasm of breast: Secondary | ICD-10-CM

## 2023-12-30 DIAGNOSIS — Z5112 Encounter for antineoplastic immunotherapy: Secondary | ICD-10-CM | POA: Diagnosis not present

## 2023-12-30 DIAGNOSIS — D649 Anemia, unspecified: Secondary | ICD-10-CM

## 2023-12-30 DIAGNOSIS — Z992 Dependence on renal dialysis: Secondary | ICD-10-CM | POA: Diagnosis not present

## 2023-12-30 DIAGNOSIS — C9000 Multiple myeloma not having achieved remission: Secondary | ICD-10-CM

## 2023-12-30 DIAGNOSIS — E1122 Type 2 diabetes mellitus with diabetic chronic kidney disease: Secondary | ICD-10-CM | POA: Diagnosis not present

## 2023-12-30 LAB — CBC WITH DIFFERENTIAL/PLATELET
Abs Immature Granulocytes: 0.05 K/uL (ref 0.00–0.07)
Basophils Absolute: 0 K/uL (ref 0.0–0.1)
Basophils Relative: 0 %
Eosinophils Absolute: 0 K/uL (ref 0.0–0.5)
Eosinophils Relative: 0 %
HCT: 31.9 % — ABNORMAL LOW (ref 36.0–46.0)
Hemoglobin: 10.3 g/dL — ABNORMAL LOW (ref 12.0–15.0)
Immature Granulocytes: 1 %
Lymphocytes Relative: 16 %
Lymphs Abs: 1.4 K/uL (ref 0.7–4.0)
MCH: 34 pg (ref 26.0–34.0)
MCHC: 32.3 g/dL (ref 30.0–36.0)
MCV: 105.3 fL — ABNORMAL HIGH (ref 80.0–100.0)
Monocytes Absolute: 0.8 K/uL (ref 0.1–1.0)
Monocytes Relative: 10 %
Neutro Abs: 6.3 K/uL (ref 1.7–7.7)
Neutrophils Relative %: 73 %
Platelets: 258 K/uL (ref 150–400)
RBC: 3.03 MIL/uL — ABNORMAL LOW (ref 3.87–5.11)
RDW: 14.7 % (ref 11.5–15.5)
WBC: 8.6 K/uL (ref 4.0–10.5)
nRBC: 0 % (ref 0.0–0.2)

## 2023-12-30 LAB — COMPREHENSIVE METABOLIC PANEL WITH GFR
ALT: 12 U/L (ref 0–44)
AST: 22 U/L (ref 15–41)
Albumin: 4.3 g/dL (ref 3.5–5.0)
Alkaline Phosphatase: 76 U/L (ref 38–126)
Anion gap: 16 — ABNORMAL HIGH (ref 5–15)
BUN: 36 mg/dL — ABNORMAL HIGH (ref 8–23)
CO2: 28 mmol/L (ref 22–32)
Calcium: 9.9 mg/dL (ref 8.9–10.3)
Chloride: 96 mmol/L — ABNORMAL LOW (ref 98–111)
Creatinine, Ser: 4.72 mg/dL — ABNORMAL HIGH (ref 0.44–1.00)
GFR, Estimated: 9 mL/min — ABNORMAL LOW (ref >60)
Glucose, Bld: 124 mg/dL — ABNORMAL HIGH (ref 70–99)
Potassium: 3.5 mmol/L (ref 3.5–5.1)
Sodium: 139 mmol/L (ref 135–145)
Total Bilirubin: 0.2 mg/dL (ref 0.0–1.2)
Total Protein: 7.4 g/dL (ref 6.5–8.1)

## 2023-12-30 LAB — MAGNESIUM: Magnesium: 2.6 mg/dL — ABNORMAL HIGH (ref 1.7–2.4)

## 2023-12-30 MED ORDER — PROCHLORPERAZINE MALEATE 10 MG PO TABS
10.0000 mg | ORAL_TABLET | Freq: Once | ORAL | Status: AC
  Administered 2023-12-30: 10 mg via ORAL
  Filled 2023-12-30: qty 1

## 2023-12-30 MED ORDER — BORTEZOMIB CHEMO SQ INJECTION 3.5 MG (2.5MG/ML)
1.2000 mg/m2 | Freq: Once | INTRAMUSCULAR | Status: AC
  Administered 2023-12-30: 2 mg via SUBCUTANEOUS
  Filled 2023-12-30: qty 0.8

## 2023-12-30 NOTE — Assessment & Plan Note (Addendum)
 She has no comprehension of why she was hospitalized and what interventions were pursued. She exhibited confabulation intermittently.

## 2023-12-30 NOTE — Assessment & Plan Note (Addendum)
 She describes an intermittent nonproductive cough.  Breath sounds are decreased with no abnormal  findings.  O2 sats 96% on room air.  Continue to monitor.

## 2023-12-30 NOTE — Patient Instructions (Signed)

## 2023-12-30 NOTE — Progress Notes (Signed)
 Patient has been examined by Dr. Davonna. Vital signs and labs have been reviewed by MD - ANC, Creatinine, LFTs, hemoglobin, and platelets have been reviewed by M.D. - pt may proceed with treatment.  Primary RN and pharmacy notified.

## 2023-12-30 NOTE — Patient Instructions (Addendum)
 [facility] [place] - A DEPT OF [facility]  Discharge Instructions: Thank you for choosing [facility] to provide your oncology and hematology care.  If you have a lab appointment with the Cancer Center - please note that after April 8th, 2024, all labs will be drawn in the cancer center.  You do not have to check in or register with the main entrance as you have in the past but will complete your check-in in the cancer center.  Wear comfortable clothing and clothing appropriate for easy access to any Portacath or PICC line.   We strive to give you quality time with your provider. You may need to reschedule your appointment if you arrive late (15 or more minutes).  Arriving late affects you and other patients whose appointments are after yours.  Also, if you miss three or more appointments without notifying the office, you may be dismissed from the clinic at the provider's discretion.      For prescription refill requests, have your pharmacy contact our office and allow 72 hours for refills to be completed.    Today you received the following bortezomib  injection today.   To help prevent nausea and vomiting after your treatment, we encourage you to take your nausea medication as directed.  BELOW ARE SYMPTOMS THAT SHOULD BE REPORTED IMMEDIATELY: *FEVER GREATER THAN 100.4 F (38 C) OR HIGHER *CHILLS OR SWEATING *NAUSEA AND VOMITING THAT IS NOT CONTROLLED WITH YOUR NAUSEA MEDICATION *UNUSUAL SHORTNESS OF BREATH *UNUSUAL BRUISING OR BLEEDING *URINARY PROBLEMS (pain or burning when urinating, or frequent urination) *BOWEL PROBLEMS (unusual diarrhea, constipation, pain near the anus) TENDERNESS IN MOUTH AND THROAT WITH OR WITHOUT PRESENCE OF ULCERS (sore throat, sores in mouth, or a toothache) UNUSUAL RASH, SWELLING OR PAIN  UNUSUAL VAGINAL DISCHARGE OR ITCHING   Items with * indicate a potential emergency and should be followed up as soon as possible or go  to the Emergency Department if any problems should occur.  Please show the CHEMOTHERAPY ALERT CARD or IMMUNOTHERAPY ALERT CARD at check-in to the Emergency Department and triage nurse.  Should you have questions after your visit or need to cancel or reschedule your appointment, please contact [facility] [place] - A DEPT OF [name] [phone]  and follow the prompts.  Office hours are 8:00 a.m. to 4:30 p.m. Monday - Friday. Please note that voicemails left after 4:00 p.m. may not be returned until the following business day.  We are closed weekends and major holidays. You have access to a nurse at all times for urgent questions. Please call the main number to the clinic [phone] and follow the prompts.  For any non-urgent questions, you may also contact your provider using MyChart. We now offer e-Visits for anyone 17 and older to request care online for non-urgent symptoms. For details visit [email or web address].   Also download the MyChart app! Go to the app store, search MyChart, open the app, select , and log in with your MyChart username and password.

## 2023-12-30 NOTE — Patient Instructions (Signed)
 See assessment and plan under each diagnosis in the problem list and acutely for this visit

## 2023-12-30 NOTE — Progress Notes (Signed)
 NURSING HOME LOCATION:  [facility] ROOM NUMBER:  [id number]  CODE STATUS:  DNR  PCP: [name], NP   This is a nursing facility follow up visit for Nursing Facility readmission within 30 days.  Interim medical record and care since last SNF visit was updated with review of diagnostic studies and change in clinical status since last visit were documented.  HPI: She was hospitalized 10/16-10/20/2025 with acute hypoxic respiratory failure; she was sent from [facility] with fever and cough and increasing lethargy.  In the ED she was tachycardic, febrile, and exhibited wheezing on auscultation of the chest.  Chest x-ray did not reveal any acute process.  Initially she was placed on nasal cannula but switched to BiPAP due to increasing hypoxia.  ABGs revealed a pO2 of 42 and a pCO2 of 55.   Viral respiratory panel was negative.  Empirically she received IV ceftriaxone and doxycycline . With pulmonary toilet including bronchodilators, mucolytics, and incentive spirometry; she was be able to be titrated to 2 L of oxygen with excellent O2 sats.  IV antibiotics were transitioned to p.o. Levaquin  and a Medrol  Dosepak was prescribed. She has ESRD on hemodialysis; creatinine was 5.39. Creatinine peak was 6.90 and GFR nadir was 5. Nephrology consulted and continued hemodialysis 10/17, 10/18, and 10/20. She did develop slight hyponatremia with sodium dropping from 142  down to 134.  While hospitalized glucoses ranged from a low of 91 up to the high of 163.  Last A1c on record was 5.1% on 9/29.    Despite the fever; white blood count was never elevated.  Microcytic anemia was present with an MCV peak of 108.3.  Anemia was relatively stable with final H/H of 9.2/28.5. Her acute metabolic encephalopathy was attributed to the acute hypoxic respiratory failure with hypercapnia and the fever.  She did return to her baseline and was able to be discharged to the SNF.  Review of systems: Dementia  invalidated responses.  She asked me if this month was October and then commented  cold is coming.  She does describe cold intolerance. She has no comprehension of why she was hospitalized.  She confabulated that they only gave her chicken meat and shots while hospitalized.  When I talked to her about the hypoxia and mentioned oxygen; she asked what is that? She describes intermittent nonproductive cough.  She also has numbness in her fingers and made the comment it bends.  When asked about depression she stated lot of death in my family so (I am)  depressed.  Constitutional: No fever, significant weight change, fatigue  Eyes: No redness, discharge, pain, vision change ENT/mouth: No nasal congestion,  purulent discharge, earache, change in hearing, sore throat  Cardiovascular: No chest pain, palpitations, paroxysmal nocturnal dyspnea  Respiratory: No hemoptysis, DOE, significant snoring, apnea   Gastrointestinal: No heartburn, dysphagia, abdominal pain, nausea /vomiting, rectal bleeding, melena, change in bowels Genitourinary: No dysuria, hematuria, pyuria, incontinence, nocturia Musculoskeletal: No joint stiffness, joint swelling, weakness, pain Dermatologic: No rash, pruritus, change in appearance of skin Neurologic: No dizziness, headache, syncope, seizures Psychiatric: No significant anxiety, insomnia, anorexia Endocrine: No change in hair/skin/nails, excessive thirst, excessive hunger, excessive urination  Hematologic/lymphatic: No significant bruising, lymphadenopathy, abnormal bleeding Allergy/immunology: No itchy/watery eyes, significant sneezing, urticaria, angioedema  Physical exam:  Pertinent or positive findings: Affect is flat and facies blank.  Her voice is gravelly.  Heart rhythm is slightly irregular.  She has a grade 1 systolic murmur.  Breath sounds are decreased.  Abdomen slightly protuberant.  She has nonpitting edema of the lower extremities &  trace edema at the  sock line. Pedal pulses are not palpable.  Dressing present over L upper chest.  General appearance: Adequately nourished; no acute distress, increased work of breathing is present.   Lymphatic: No lymphadenopathy about the head, neck, axilla. Eyes: No conjunctival inflammation or lid edema is present. There is no scleral icterus. Ears:  External ear exam shows no significant lesions or deformities.   Nose:  External nasal examination shows no deformity or inflammation. Nasal mucosa are pink and moist without lesions, exudates Oral exam:  Lips and gums are healthy appearing. There is no oropharyngeal erythema or exudate. Neck:  No thyromegaly, masses, tenderness noted.    Heart:  No gallop, click, rub .  Lungs:  without wheezes, rhonchi, rales, rubs. Abdomen: Bowel sounds are normal. Abdomen is soft and nontender with no organomegaly, hernias, masses. GU: Deferred  Extremities:  No cyanosis, clubbing Neurologic exam :Balance, Rhomberg, finger to nose testing could not be completed due to clinical state Skin: Warm & dry w/o tenting. No significant lesions or rash.  See summary under each active problem in the Problem List with associated updated therapeutic plan :  Vascular dementia with behavior disturbance [facility]) She has no comprehension of why she was hospitalized and what interventions were pursued. She exhibited confabulation intermittently.  Type 2 diabetes mellitus with hypertension and end stage renal disease on dialysis [facility]) While hospitalized peak creatinine was 6.90 and GFR nadir was 5.  She received hemodialysis 10/17, 10/18, and 10/20.  Glucoses ranged from a low of 91 up to a high of 163.  Most recent A1c on record was 5.1% on 9/29.  Blood pressure is well-controlled and in fact it is soft.  This has been an issue at dialysis and is monitored closely.  Acute hypoxic respiratory failure (HCC) She describes an intermittent nonproductive cough.  Breath sounds are decreased with  no abnormal  findings.  O2 sats 96% on room air.  Continue to monitor.  Normocytic anemia While hospitalized 10/16 - 12/27/2023 with acute hypoxic respiratory failure; anemia was relatively stable with final H/H of 9.2/28.5.  Anemia is macrocytic with an MCV of 108.3.  Despite profound macrocytosis; B12 was normal at 797 and folate greater than 20 on 11/18/2023.  Continue to monitor.

## 2023-12-30 NOTE — Progress Notes (Signed)
 Velcade  injection given per orders. Patient tolerated it well without problems. Vitals stable and discharged home from clinic via wheelchair. Follow up as scheduled.

## 2023-12-30 NOTE — Progress Notes (Signed)
 Patient Care Team: Carly Barnie RAMAN, NP as PCP - General (Geriatric Medicine) Carly Vina GAILS, MD as PCP - Cardiology (Cardiology) Carly [name], MD as Consulting Physician (Gastroenterology) Carly Paula, MD (Inactive) as Consulting Physician (Nephrology) [facility]  Clinic Day:  12/30/2023  Referring physician: [name], NP   CHIEF COMPLAINT:  CC: IgA lambda plasma cell myeloma, stage II, standard risk   Carly Wood 88 y.o. female was transferred to my care after her prior physician has left.   ASSESSMENT & PLAN:   Assessment & Plan: Carly Wood  is a 88 y.o. female with IgA lambda plasma cell myeloma Assessment and Plan Assessment & Plan Multiple myeloma not having achieved remission R-ISS-stage II IgA lambda plasma cell myeloma Patient has always been on Velcade  and dexamethasone .  Revlimid was not started because of poor renal function Multiple myeloma labs slowly trending up.   - Reviewed labs from today: CMP: Creatinine: 4.72-stable, normal LFTs, CBC: Hemoglobin: 10.3, WBC: 8.6, platelets: 258. -Multiple myeloma labs reviewed from 12/09/2023: Kappa free light chain: 162.1, lambda free light chain: 551.9, ratio: 0.29.  SPEP: M spike: 0.4, IFE: Faint band in the beta region suspicious for monoclonal immunoglobulin. - Continue Velcade  weekly.  Physical exam stable today.  Okay to proceed with treatment today. - If disease continues to progress, will discuss goals of care with the patient.  If patient wishes to continue treatment, can consider single agent daratumumab monthly along with Velcade .   Return to clinic in 8 weeks with labs  End-stage renal disease Continue hemodialysis Monday Wednesday Friday  Normocytic anemia Likely secondary to multiple myeloma, ESRD, iron  deficiency   - Obtain iron  panel with next blood draw.  And replace as needed.  Left finger paresthesia Numbness and tingling in the left finger  without significant discomfort or functional impairment.  History of right breast cancer Oncology history below.  S/p mastectomy. ER/PR positive, HER2 negative, pT1c pNX.  S/p adjuvant chemotherapy. Currently on anastrozole   -Tolerating anastrozole  well. - Continue anastrozole  1 mg daily.  Will considering discontinuing at next visit.  Patient completed 5 years of treatment.   The patient understands the plans discussed today and is in agreement with them.  She knows to contact our office if she develops concerns prior to her next appointment.  The total time spent in the appointment was 20 minutes for the encounter with patient, including review of chart and various tests results, discussions about plan of care and coordination of care plan  Carly Dry, MD  [facility] [place] - A DEPT OF Carly Wood [facility] [address] [place] [address] Dept: [phone] Dept Fax: [phone]   No orders of the defined types were placed in this encounter.    ONCOLOGY HISTORY:   I have reviewed her chart and materials related to her cancer extensively and collaborated history with the patient. Summary of oncologic history is as follows:   IgA lambda plasma cell myeloma, stage II, standard risk   -05/13/2016: Initial Myeloma labs. M spike 0.7 g. IgA 1,257. FLC ratio 4.99 with elevated lambda light chains at 339.9 and elevated kappa light chains at 68.1.  Patient refused bone marrow biopsy. -08/29/2018: Myeloma labs at diagnosis. M spike 0.9 g.  Kappa light chains 148, lambda light chains 1132, ratio 7.64.  LDH normal.  Beta-2  microglobulin 13.5.  -09/20/2018: Bone Marrow Biopsy: Pathology: Plasma cell myeloma. The marrow is normocellular  for age but with increased monoclonal plasma cells (15% aspirate, 30% CD138 immunohistochemistry). By light chain in situ hybridization the plasma cells are lambda restricted. There are scattered  lymphoid aggregates which consist of predominately T-cells (CD3, CD5) and scattered B-cells (CD20). CD10, bcl-6, and cyclinD1 are negative. There is a mild erythroid hyperplasia. Megakaryocytes exhibit a spectrum of maturation without tight clusters.  -Flow Cytometry: No monoclonal B-cell or phenotypically aberrant T-cell population identified.   -Normal Female Karyotype  -Myeloma FISH Panel: Normal -10/04/2018: Bone Survey: No aggressive lytic or sclerotic osseous lesion.  -10/11/2018-current: Velcade  and dexamethasone . Revlimid not added to regimen due to impaired renal function.     Diagnosis: Stage I IDC of the right breast, ER/PR positive, HER-2 negative   -02/16/2014: Abnormal mammogram with mass noted in right breast. Subsequent US  showed hypoechoic mass, which was biopsied and found to be benign.  -09/12/2014: Diagnostic Mammogram and US  of right breast: Spiculated mass in upper outer right breast appears slightly increased in size from prior exam.  -10/02/2014: Right breast biopsy with additional right breast excision.  Biopsy Pathology: Invasive ductal carcinoma,high grade solid type. 1.1 cm tumor size involving lateral resection margin.  -DCIS, Solid type -Excision Pathology: Invasive high-grade ductal carcinoma, 0.9 cm DCIS, cribriform and solid types. 0.9 cm tumor size involving the anterior and deep resection margins.  Comment: The tumor cells are ER positive (90%), PR positive (30%), and Her2 negative (2+). 2 different invasive tumor cells were counted during Her2/NEU Gene amplification. pT1C pNx Mx -10/24/2014: Right mastectomy with sentinel lymph node excision.  Pathology: Focal DCIS with negative margins. Zero of seven lymph nodes identified positive for metastatic carcinoma (0/7). pT1cpN0 -12/28/2014-11/22/2015: Taxol/herceptin  weekly X 12, Herceptin  every 21 days for 52 weeks -03/11/2015-current: Anastrozole  -06/08/2016: Neratinib  for 1 year  Current Treatment:  Weekly  Velcade  for multiple myeloma and anastrozole  for breast cancer  INTERVAL HISTORY:   Discussed the use of AI scribe software for clinical note transcription with the patient, who gave verbal consent to proceed.  History of Present Illness Carly Wood is an 88 year old female with multiple myeloma who presents for follow-up after hospitalization for difficulty breathing.  The patient reports that her breathing is better now since her recent hospitalization.  She experiences numbness and tingling in her left fingers, which does not significantly bother her. She notes that her finger bends, but this is not causing distress.  No other complaints or questions were raised during the visit.    I have reviewed the past medical history, past surgical history, social history and family history with the patient and they are unchanged from previous note.  ALLERGIES:  is allergic to ace inhibitors, angiotensin receptor blockers, and penicillins.  MEDICATIONS:  Current Outpatient Medications  Medication Sig Dispense Refill   acetaminophen  (TYLENOL ) 325 MG tablet Take 650 mg by mouth 3 (three) times daily.     acyclovir  (ZOVIRAX ) 200 MG capsule Take 200 mg by mouth in the morning. (0800)     aspirin  EC 81 MG tablet Take 1 tablet (81 mg total) by mouth daily. Swallow whole.     atorvastatin  (LIPITOR) 40 MG tablet Take 1 tablet (40 mg total) by mouth daily. (Patient taking differently: Take 40 mg by mouth every evening.)     Calcium  Carb-Cholecalciferol  (CALCIUM  + VITAMIN D3) 500-5 MG-MCG TABS Take 1 tablet by mouth daily.     dextromethorphan-guaiFENesin (MUCINEX DM) 30-600 MG 12hr tablet Take 1 tablet by mouth 2 (two) times daily for 5 days.  10 tablet 0   levofloxacin  (LEVAQUIN ) 250 MG tablet Take 1 tablet (250 mg total) by mouth daily for 3 days. 3 tablet 0   melatonin 5 MG TABS Take 5 mg by mouth at bedtime.     methylPREDNISolone  (MEDROL  DOSEPAK) 4 MG TBPK tablet Medrol  Dosepak take as  instructed 21 tablet 0   multivitamin (RENA-VIT) TABS tablet Take 1 tablet by mouth at bedtime.     ondansetron  (ZOFRAN -ODT) 4 MG disintegrating tablet Take 4 mg by mouth daily before breakfast.     pantoprazole  (PROTONIX ) 40 MG tablet Take 40 mg by mouth 2 (two) times daily.     sennosides-docusate sodium (SENOKOT-S) 8.6-50 MG tablet Take 1 tablet by mouth 2 (two) times daily.     sevelamer  carbonate (RENVELA ) 800 MG tablet Take 800 mg by mouth daily. (Patient taking differently: Take 800 mg by mouth 3 (three) times daily with meals.)     sucralfate  (CARAFATE ) 1 GM/10ML suspension Take 10 mLs by mouth 2 (two) times daily.     No current facility-administered medications for this visit.   Facility-Administered Medications Ordered in Other Visits  Medication Dose Route Frequency Provider Last Rate Last Admin   lanreotide acetate  (SOMATULINE DEPOT ) 120 MG/0.5ML injection            lanreotide acetate  (SOMATULINE DEPOT ) 120 MG/0.5ML injection            lanreotide acetate  (SOMATULINE DEPOT ) 120 MG/0.5ML injection            lanreotide acetate  (SOMATULINE DEPOT ) 120 MG/0.5ML injection            octreotide  (SANDOSTATIN  LAR) 30 MG IM injection            octreotide  (SANDOSTATIN  LAR) 30 MG IM injection             REVIEW OF SYSTEMS:   Constitutional: Denies fevers, chills or abnormal weight loss Eyes: Denies blurriness of vision Ears, nose, mouth, throat, and face: Denies mucositis or sore throat Respiratory: Denies cough, dyspnea or wheezes Cardiovascular: Denies palpitation, chest discomfort or lower extremity swelling Gastrointestinal:  Denies nausea, heartburn or change in bowel habits Skin: Denies abnormal skin rashes Lymphatics: Denies new lymphadenopathy or easy bruising Neurological:Denies numbness, tingling or new weaknesses Behavioral/Psych: Mood is stable, no new changes  All other systems were reviewed with the patient and are negative.   VITALS:  There were no vitals taken  for this visit.  Wt Readings from Last 3 Encounters:  12/30/23 136 lb 12.8 oz (62.1 kg)  12/28/23 135 lb 8 oz (61.5 kg)  12/27/23 138 lb 0.1 oz (62.6 kg)    There is no height or weight on file to calculate BMI.  Performance status (ECOG): 3 - Symptomatic, >50% confined to bed  PHYSICAL EXAM:   GENERAL:alert, no distress and comfortable, in a wheelchair SKIN: skin color, texture, turgor are normal, no rashes or significant lesions LYMPH:  no palpable lymphadenopathy in the cervical, axillary or inguinal LUNGS: clear to auscultation and percussion with normal breathing effort HEART: regular rate & rhythm and no murmurs and no lower extremity edema ABDOMEN:abdomen soft, non-tender and normal bowel sounds Musculoskeletal:no cyanosis of digits and no clubbing  NEURO: alert & oriented x 3 with fluent speech, no focal motor/sensory deficits  LABORATORY DATA:  I have reviewed the data as listed   Lab Results  Component Value Date   WBC 8.6 12/30/2023   NEUTROABS 6.3 12/30/2023   HGB 10.3 (L) 12/30/2023   HCT 31.9 (  L) 12/30/2023   MCV 105.3 (H) 12/30/2023   PLT 258 12/30/2023     Chemistry      Component Value Date/Time   NA 139 12/30/2023 1147   K 3.5 12/30/2023 1147   CL 96 (L) 12/30/2023 1147   CO2 28 12/30/2023 1147   BUN 36 (H) 12/30/2023 1147   CREATININE 4.72 (H) 12/30/2023 1147      Component Value Date/Time   CALCIUM  9.9 12/30/2023 1147   CALCIUM  9.0 10/18/2018 1059   ALKPHOS 76 12/30/2023 1147   AST 22 12/30/2023 1147   ALT 12 12/30/2023 1147   BILITOT 0.2 12/30/2023 1147       Latest Reference Range & Units 12/09/23 11:11  Total Protein ELP 6.0 - 8.5 g/dL 6.6  Albumin  ELP 2.9 - 4.4 g/dL 3.7  Globulin, Total 2.2 - 3.9 g/dL 2.9 (C)  A/G Ratio 0.7 - 1.7  1.3 (C)  Alpha-1-Globulin 0.0 - 0.4 g/dL 0.3  Joeyj-7-Honalopw 0.4 - 1.0 g/dL 0.8  Beta Globulin 0.7 - 1.3 g/dL 1.0  Gamma Globulin 0.4 - 1.8 g/dL 0.8  M-SPIKE, % Not Observed g/dL 0.4 (H)  SPE Interp.   Comment  Comment  Comment  (H): Data is abnormally high (C): Corrected  Latest Reference Range & Units 12/09/23 11:10  Kappa free light chain 3.3 - 19.4 mg/L 160.1 (H)  Lambda free light chains 5.7 - 26.3 mg/L 551.9 (H)  Kappa, lambda light chain ratio 0.26 - 1.65  0.29  (H): Data is abnormally high  RADIOGRAPHIC STUDIES: I have personally reviewed the radiological images as listed and agreed with the findings in the report.  None new to review

## 2023-12-30 NOTE — Assessment & Plan Note (Signed)
 While hospitalized peak creatinine was 6.90 and GFR nadir was 5.  She received hemodialysis 10/17, 10/18, and 10/20.  Glucoses ranged from a low of 91 up to a high of 163.  Most recent A1c on record was 5.1% on 9/29.  Blood pressure is well-controlled and in fact it is soft.  This has been an issue at dialysis and is monitored closely.

## 2023-12-30 NOTE — Assessment & Plan Note (Signed)
 While hospitalized 10/16 - 12/27/2023 with acute hypoxic respiratory failure; anemia was relatively stable with final H/H of 9.2/28.5.  Anemia is macrocytic with an MCV of 108.3.  Despite profound macrocytosis; B12 was normal at 797 and folate greater than 20 on 11/18/2023.  Continue to monitor.

## 2023-12-31 ENCOUNTER — Other Ambulatory Visit: Payer: Self-pay

## 2023-12-31 DIAGNOSIS — J9601 Acute respiratory failure with hypoxia: Secondary | ICD-10-CM | POA: Diagnosis not present

## 2023-12-31 DIAGNOSIS — R1312 Dysphagia, oropharyngeal phase: Secondary | ICD-10-CM | POA: Diagnosis not present

## 2023-12-31 DIAGNOSIS — F01518 Vascular dementia, unspecified severity, with other behavioral disturbance: Secondary | ICD-10-CM | POA: Diagnosis not present

## 2024-01-03 DIAGNOSIS — J9601 Acute respiratory failure with hypoxia: Secondary | ICD-10-CM | POA: Diagnosis not present

## 2024-01-03 DIAGNOSIS — Z992 Dependence on renal dialysis: Secondary | ICD-10-CM | POA: Diagnosis not present

## 2024-01-03 DIAGNOSIS — R1312 Dysphagia, oropharyngeal phase: Secondary | ICD-10-CM | POA: Diagnosis not present

## 2024-01-03 DIAGNOSIS — N186 End stage renal disease: Secondary | ICD-10-CM | POA: Diagnosis not present

## 2024-01-04 DIAGNOSIS — F01518 Vascular dementia, unspecified severity, with other behavioral disturbance: Secondary | ICD-10-CM | POA: Diagnosis not present

## 2024-01-04 DIAGNOSIS — J9601 Acute respiratory failure with hypoxia: Secondary | ICD-10-CM | POA: Diagnosis not present

## 2024-01-04 DIAGNOSIS — R1312 Dysphagia, oropharyngeal phase: Secondary | ICD-10-CM | POA: Diagnosis not present

## 2024-01-05 ENCOUNTER — Non-Acute Institutional Stay (SKILLED_NURSING_FACILITY): Payer: Self-pay | Admitting: Adult Health

## 2024-01-05 ENCOUNTER — Encounter: Payer: Self-pay | Admitting: Adult Health

## 2024-01-05 DIAGNOSIS — Z0000 Encounter for general adult medical examination without abnormal findings: Secondary | ICD-10-CM | POA: Diagnosis not present

## 2024-01-05 DIAGNOSIS — R1312 Dysphagia, oropharyngeal phase: Secondary | ICD-10-CM | POA: Diagnosis not present

## 2024-01-05 DIAGNOSIS — F01518 Vascular dementia, unspecified severity, with other behavioral disturbance: Secondary | ICD-10-CM | POA: Diagnosis not present

## 2024-01-05 DIAGNOSIS — J9601 Acute respiratory failure with hypoxia: Secondary | ICD-10-CM | POA: Diagnosis not present

## 2024-01-05 DIAGNOSIS — N186 End stage renal disease: Secondary | ICD-10-CM | POA: Diagnosis not present

## 2024-01-05 DIAGNOSIS — Z992 Dependence on renal dialysis: Secondary | ICD-10-CM | POA: Diagnosis not present

## 2024-01-05 NOTE — Progress Notes (Signed)
 Subjective:   Carly Wood is a 88 y.o. female who presents for Medicare Annual (Subsequent) preventive examination.  Visit Complete: In person Patient Medicare AWV questionnaire was completed by the patient on [id number]; I have confirmed that all information answered by patient is correct and no changes since this date.  Cardiac Risk Factors include: advanced age (>71men, >77 women);diabetes mellitus;hypertension;sedentary lifestyle;microalbuminuria     Objective:    Today's Vitals   01/05/24 1152  BP: (!) 108/58  Pulse: 70  Resp: 20  Temp: 97.6 F (36.4 C)  SpO2: 97%  Weight: 138 lb (62.6 kg)  Height: 5' 2 (1.575 m)   Body mass index is 25.24 kg/m.     12/30/2023    1:03 PM 12/28/2023   10:14 AM 12/27/2023    2:01 PM 12/23/2023   12:33 PM 12/02/2023   12:05 PM 11/25/2023    1:18 PM 11/18/2023   12:40 PM  Advanced Directives  Does Patient Have a Medical Advance Directive? Yes Yes  Yes No No No  Type of Estate Agent of [place];Living will Healthcare Power of [place];Out of facility DNR (pink MOST or yellow form)    Healthcare Power of [employer] Power of Attorney  Does patient want to make changes to medical advance directive?   No - Patient declined No - Patient declined No - Patient declined  No - Patient declined  Copy of Healthcare Power of Attorney in Chart? No - copy requested Yes - validated most recent copy scanned in chart (See row information)    No - copy requested No - copy requested  Would patient like information on creating a medical advance directive? No - Patient declined     No - Patient declined No - Patient declined  Pre-existing out of facility DNR order (yellow form or pink MOST form)  Yellow form placed in chart (order not valid for inpatient use)    Yellow form placed in chart (order not valid for inpatient use)     Current Medications (verified) Outpatient Encounter Medications as of 01/05/2024  Medication Sig    acetaminophen  (TYLENOL ) 325 MG tablet Take 650 mg by mouth 3 (three) times daily.   acyclovir  (ZOVIRAX ) 200 MG capsule Take 200 mg by mouth in the morning. (0800)   aspirin  EC 81 MG tablet Take 1 tablet (81 mg total) by mouth daily. Swallow whole.   atorvastatin  (LIPITOR) 40 MG tablet Take 1 tablet (40 mg total) by mouth daily. (Patient taking differently: Take 40 mg by mouth every evening.)   Balsam [place]-Castor Oil (VENELEX) OINT Apply topically as directed. Special Instructions: Apply to bilateral buttocks, sacrum, coccyx qshift for prevention. Every Shift Day, Evening, Night   Calcium  Carb-Cholecalciferol  (CALCIUM  + VITAMIN D3) 500-5 MG-MCG TABS Take 1 tablet by mouth daily.   melatonin 5 MG TABS Take 5 mg by mouth at bedtime.   multivitamin (RENA-VIT) TABS tablet Take 1 tablet by mouth at bedtime.   ondansetron  (ZOFRAN -ODT) 4 MG disintegrating tablet Take 4 mg by mouth daily before breakfast.   pantoprazole  (PROTONIX ) 40 MG tablet Take 40 mg by mouth 2 (two) times daily.   sennosides-docusate sodium (SENOKOT-S) 8.6-50 MG tablet Take 1 tablet by mouth 2 (two) times daily.   sevelamer  carbonate (RENVELA ) 800 MG tablet Take 800 mg by mouth daily. (Patient taking differently: Take 800 mg by mouth 3 (three) times daily with meals.)   sucralfate  (CARAFATE ) 1 GM/10ML suspension Take 10 mLs by mouth 2 (two) times daily.  methylPREDNISolone  (MEDROL  DOSEPAK) 4 MG TBPK tablet Medrol  Dosepak take as instructed (Patient not taking: Reported on 01/05/2024)   Facility-Administered Encounter Medications as of 01/05/2024  Medication   lanreotide acetate  (SOMATULINE DEPOT ) 120 MG/0.5ML injection   lanreotide acetate  (SOMATULINE DEPOT ) 120 MG/0.5ML injection   lanreotide acetate  (SOMATULINE DEPOT ) 120 MG/0.5ML injection   lanreotide acetate  (SOMATULINE DEPOT ) 120 MG/0.5ML injection   octreotide  (SANDOSTATIN  LAR) 30 MG IM injection   octreotide  (SANDOSTATIN  LAR) 30 MG IM injection    Allergies  (verified) Ace inhibitors, Angiotensin receptor blockers, and Penicillins   History: Past Medical History:  Diagnosis Date   Anemia     chronic macrocytic anemia   Anxiety    Chronic kidney disease    Chronic renal disease, stage 4, severely decreased glomerular filtration rate (GFR) between 15-29 mL/min/1.73 square meter (HCC) 08/22/2015   Complication of anesthesia    delirious after Breast Surgery   Dementia (HCC)    mild   Depression    Diabetes mellitus with ESRD (end-stage renal disease) (HCC)    type II   Dysphagia    Dyspnea    with activity   GERD (gastroesophageal reflux disease)    Glaucoma    Hyperlipidemia    Hypertension    Multiple myeloma (HCC)    Pneumonia    Stage 1 infiltrating ductal carcinoma of right female breast (HCC) 08/21/2015   ER+ PR+ HER 2 neu + (3+) T1cN0    Past Surgical History:  Procedure Laterality Date   A/V FISTULAGRAM Right 07/05/2020   Procedure: A/V Fistulagram;  Surgeon: Harvey Carlin BRAVO, MD;  [facility];  Service: Cardiovascular;  Laterality: Right;   AV FISTULA PLACEMENT Left 11/22/2017   Procedure: ARTERIOVENOUS (AV) FISTULA CREATION LEFT ARM;  Surgeon: Harvey Carlin BRAVO, MD;  Location: [facility] OR;  Service: Vascular;  Laterality: Left;   AV FISTULA PLACEMENT Right 04/04/2020   Procedure: RIGHT ARM ARTERIOVENOUS FISTULA CREATION;  Surgeon: Oris Krystal FALCON, MD;  Location: [facility];  Service: Vascular;  Laterality: Right;   AV FISTULA PLACEMENT Right 08/20/2020   Procedure: ARTERIOVENOUS (AV) FISTULA LIGATION RIGHT ARM;  Surgeon: Harvey Carlin BRAVO, MD;  Location: [facility] OR;  Service: Vascular;  Laterality: Right;   BIOPSY  08/07/2016   Procedure: BIOPSY;  Surgeon: Shaaron [name], MD;  [facility];  Service: Endoscopy;;  gastric ulcer biopsy   COLONOSCOPY     ESOPHAGOGASTRODUODENOSCOPY N/A 08/07/2016   LA Grade A esophagitis s/p dilation, small hiatal hernia, multiple gastric ulcers and erosions, duodenal erosions  s/p biopsy. Negative H.pylori    ESOPHAGOGASTRODUODENOSCOPY N/A 11/27/2016   normal esophagus, previously noted gastric ulcers completely healed, normal duodenum.    ESOPHAGOGASTRODUODENOSCOPY (EGD) WITH PROPOFOL  N/A 07/23/2020   Procedure: ESOPHAGOGASTRODUODENOSCOPY (EGD) WITH PROPOFOL ;  Surgeon: Golda Claudis PENNER, MD;  [facility];  Service: Endoscopy;  Laterality: N/A;   FISTULA SUPERFICIALIZATION Left 02/14/2018   Procedure: FISTULA SUPERFICIALIZATION LEFT ARM;  Surgeon: Eliza Lonni RAMAN, MD;  Location[facility] OR;  Service: Vascular;  Laterality: Left;   FRACTURE SURGERY Right    ankle   HOT HEMOSTASIS  07/23/2020   Procedure: HOT HEMOSTASIS (ARGON PLASMA COAGULATION/BICAP);  Surgeon: Golda Claudis PENNER, MD;  [facility];  Service: Endoscopy;;   INTRAMEDULLARY (IM) NAIL INTERTROCHANTERIC Right 07/12/2020   Procedure: INTRAMEDULLARY (IM) NAIL INTERTROCHANTRIC;  Surgeon: Onesimo Oneil LABOR, MD;  Location: [facility];  Service: Orthopedics;  Laterality: Right;   IR FLUORO GUIDE CV LINE LEFT  12/21/2022  IR US  GUIDE VASC ACCESS LEFT  12/21/2022   MALONEY DILATION N/A 08/07/2016   Procedure: AGAPITO DILATION;  Surgeon: Shaaron [name], MD;  [facility];  Service: Endoscopy;  Laterality: N/A;   MASTECTOMY, PARTIAL Right    PERIPHERAL VASCULAR BALLOON ANGIOPLASTY Left 07/13/2019   Procedure: PERIPHERAL VASCULAR BALLOON ANGIOPLASTY;  Surgeon: Gretta Lonni PARAS, MD;  [facility];  Service: Cardiovascular;  Laterality: Left;  arm fistulogram   PERIPHERAL VASCULAR BALLOON ANGIOPLASTY Right 05/22/2020   Procedure: PERIPHERAL VASCULAR BALLOON ANGIOPLASTY;  Surgeon: Magda Debby SAILOR, MD;  [facility];  Service: Cardiovascular;  Laterality: Right;  arm fistula   PERIPHERAL VASCULAR BALLOON ANGIOPLASTY Right 07/05/2020   Procedure: PERIPHERAL VASCULAR BALLOON ANGIOPLASTY;  Surgeon: Harvey Carlin BRAVO, MD;  [facility];   Service: Cardiovascular;  Laterality: Right;  arm fistula   PORT-A-CATH REMOVAL Left 11/22/2017   Procedure: REMOVAL PORT-A-CATH LEFT CHEST;  Surgeon: Harvey Carlin BRAVO, MD;  Location: [facility] OR;  Service: Vascular;  Laterality: Left;   RETINAL DETACHMENT SURGERY Right    SCLEROTHERAPY  07/23/2020   Procedure: SCLEROTHERAPY;  Surgeon: Golda Claudis PENNER, MD;  [facility];  Service: Endoscopy;;   TUNNELLED CATHETER EXCHANGE N/A 08/16/2023   Procedure: TUNNELLED CATHETER EXCHANGE;  Surgeon: Magda Debby SAILOR, MD;  Location: [facility];  Service: Cardiovascular;  Laterality: N/A;   UPPER EXTREMITY VENOGRAPHY N/A 08/22/2020   Procedure: LEFT UPPER & CENTRAL VENOGRAPHY;  Surgeon: Gretta Lonni PARAS, MD;  [facility];  Service: Cardiovascular;  Laterality: N/A;   Family History  Problem Relation Age of Onset   Multiple myeloma Sister    Brain cancer Sister    Dementia Mother        died at 97   Stroke Mother    Heart failure Mother    Diabetes Mother    Heart disease Father    Prostate cancer Brother    Colon cancer Neg Hx    Social History   Socioeconomic History   Marital status: Single    Spouse name: Not on file   Number of children: Not on file   Years of education: Not on file   Highest education level: Not on file  Occupational History   Occupation: retired   Tobacco Use   Smoking status: Never   Smokeless tobacco: Never  Vaping Use   Vaping status: Never Used  Substance and Sexual Activity   Alcohol  use: No    Alcohol /week: 0.0 standard drinks of alcohol    Drug use: No   Sexual activity: Never  Other Topics Concern   Not on file  Social History Narrative   Long term resident of SNF    Social Drivers of Health   Financial Resource Strain: Low Risk  (01/09/2020)   Overall Financial Resource Strain (CARDIA)    Difficulty of Paying Living Expenses: Not very hard  Food Insecurity: Patient Unable To Answer (12/24/2023)   Hunger Vital Sign     Worried About Running Out of Food in the Last Year: Patient unable to answer    Ran Out of Food in the Last Year: Patient unable to answer  Transportation Needs: Patient Unable To Answer (12/24/2023)   PRAPARE - Transportation    Lack of Transportation (Medical): Patient unable to answer    Lack of Transportation (Non-Medical): Patient unable to answer  Physical Activity: Inactive (01/09/2020)   Exercise Vital Sign    Days of Exercise per  Week: 0 days    Minutes of Exercise per Session: 0 min  Stress: No Stress Concern Present (01/09/2020)   [employer] of Occupational Health - Occupational Stress Questionnaire    Feeling of Stress : Not at all  Social Connections: Patient Unable To Answer (12/24/2023)   Social Connection and Isolation Panel    Frequency of Communication with Friends and Family: Patient unable to answer    Frequency of Social Gatherings with Friends and Family: Patient unable to answer    Attends Religious Services: Patient unable to answer    Active Member of Clubs or Organizations: Patient unable to answer    Attends Banker Meetings: Patient unable to answer    Marital Status: Patient unable to answer    Tobacco Counseling Counseling given: Not Answered   Clinical Intake:  Pre-visit preparation completed: No  Pain : No/denies pain     BMI - recorded: 24.24 Nutritional Status: BMI of 19-24  Normal Nutritional Risks: Unintentional weight loss Diabetes: Yes CBG done?: Yes CBG resulted in Enter/ Edit results?: Yes Did pt. bring in CBG monitor from home?: No  How often do you need to have someone help you when you read instructions, pamphlets, or other written materials from your doctor or pharmacy?: 5 - Always  Interpreter Needed?: No      Activities of Daily Living    01/05/2024    2:32 PM 12/27/2023    2:00 PM  In your present state of health, do you have any difficulty performing the following activities:  Hearing? 0    Vision? 0   Difficulty concentrating or making decisions? 1   Walking or climbing stairs? 1   Dressing or bathing? 1   Doing errands, shopping? 1 0  Preparing Food and eating ? Y   Using the Toilet? Y   In the past six months, have you accidently leaked urine? Y   Do you have problems with loss of bowel control? Y   Managing your Medications? Y   Managing your Finances? Y   Housekeeping or managing your Housekeeping? Y     Patient Care Team: [name], NP as PCP - General (Geriatric Medicine) [name], MD as PCP - Cardiology (Cardiology) Shaaron [name], MD as Consulting Physician (Gastroenterology) [name], MD (Inactive) as Consulting Physician (Nephrology) [facility]  Indicate any recent Medical Services you may have received from other than [facility] providers in the past year (date may be approximate).     Assessment:   This is a routine wellness examination for Carly Wood.  Hearing/Vision screen No results found.   Goals Addressed             This Visit's Progress    DIET - INCREASE WATER  INTAKE   On track    Follow up with Provider as scheduled   On track    General - Client will not be readmitted within 30 days (C-SNP)   On track      Depression Screen    01/05/2024    2:34 PM 12/30/2023    1:01 PM 12/16/2023   12:00 PM 12/09/2023   12:12 PM 12/02/2023   12:00 PM 11/25/2023    1:19 PM 11/18/2023   12:40 PM  PHQ 2/9 Scores  PHQ - 2 Score 0 0 0 0 0 0 0    Fall Risk    01/05/2024    2:33 PM 03/17/2023   12:00 PM 12/31/2022   11:42  AM 12/19/2021   12:01 PM 12/19/2021    8:44 AM  Fall Risk   Falls in the past year? 0 0 0 1 0  Number falls in past yr: 0 0 0 1 0  Injury with Fall? 0 0 0 0 0  Risk for fall due to : Impaired balance/gait;Impaired mobility  Impaired balance/gait;Impaired mobility History of fall(s);Impaired balance/gait;Impaired mobility No Fall Risks  Follow up   Falls evaluation completed  Falls evaluation completed  Falls evaluation completed      Data saved with a previous flowsheet row definition    MEDICARE RISK AT HOME: Medicare Risk at Home Any stairs in or around the home?: Yes If so, are there any without handrails?: No Home free of loose throw rugs in walkways, pet beds, electrical cords, etc?: Yes Adequate lighting in your home to reduce risk of falls?: Yes Life alert?: No Use of a cane, walker or w/c?: Yes Grab bars in the bathroom?: Yes Shower chair or bench in shower?: Yes Elevated toilet seat or a handicapped toilet?: Yes  TIMED UP AND GO:  Was the test performed?  No    Cognitive Function:    01/05/2024    2:34 PM 12/31/2022   11:42 AM 12/19/2021   12:05 PM 12/17/2020   10:12 AM  MMSE - Mini Mental State Exam  Not completed: Unable to complete Unable to complete Unable to complete Unable to complete        12/19/2021   12:05 PM 11/08/2019   11:04 AM  6CIT Screen  What Year? 0 points 0 points  What month? 3 points 0 points  What time? 3 points 3 points  Count back from 20 2 points 2 points  Months in reverse 4 points 2 points  Repeat phrase 6 points 2 points  Total Score 18 points 9 points    Immunizations Immunization History  Administered Date(s) Administered    sv, Bivalent, Protein Subunit Rsvpref,pf (Abrysvo) 02/09/2022   Fluad Quad(high Dose 65+) 11/22/2018, 12/09/2021, 12/15/2022   Hepatitis B, ADULT 02/28/2020   Influenza Inj Mdck Quad Pf 12/20/2014   Influenza,inj,Quad PF,6+ Mos 11/22/2015, 12/11/2020   Influenza,inj,quad, With Preservative 12/20/2014, 12/31/2016   Influenza-Unspecified 11/22/2018, 12/15/2019, 12/09/2021, 12/09/2023   Moderna Covid-19 Fall Seasonal Vaccine 63yrs & older 12/22/2023   Moderna Covid-19 Vaccine Bivalent Booster 69yrs & up 12/31/2020   Moderna SARS-COV2 Booster Vaccination 06/19/2020, 10/23/2020, 07/29/2021, 12/31/2021   Moderna Sars-Covid-2 Vaccination 05/11/2019, 06/05/2019, 01/11/2020,  12/15/2022, 01/04/2023, 06/08/2023   PNEUMOCOCCAL CONJUGATE-20 12/13/2020   PPD Test 05/03/2019, 06/23/2019, 08/16/2019, 06/27/2020, 07/13/2020   Pneumococcal Conjugate-13 12/20/2014   Pneumococcal Polysaccharide-23 12/06/2015   Pneumococcal-Unspecified 12/20/2014   Tdap 01/16/2021   Zoster Recombinant(Shingrix) 11/05/2009, 04/01/2021    TDAP status: Up to date  Flu Vaccine status: Up to date  Pneumococcal vaccine status: Up to date  Covid-19 vaccine status: Completed vaccines  Qualifies for Shingles Vaccine? No   Zostavax completed     Screening Tests Health Maintenance  Topic Date Due   Medicare Annual Wellness (AWV)  12/31/2023   OPHTHALMOLOGY EXAM  02/22/2024   HEMOGLOBIN A1C  06/04/2024   COVID-19 Vaccine (8 - Moderna risk 2025-26 season) 06/21/2024   FOOT EXAM  12/06/2024   DTaP/Tdap/Td (2 - Td or Tdap) 01/17/2031   Pneumococcal Vaccine: 50+ Years  Completed   Influenza Vaccine  Completed   DEXA SCAN  Completed   Zoster Vaccines- Shingrix  Completed   Meningococcal B Vaccine  Aged Out   Hepatitis B Vaccines 19-59  Average Risk  Discontinued   Mammogram  Discontinued    Health Maintenance  Health Maintenance Due  Topic Date Due   Medicare Annual Wellness (AWV)  12/31/2023    Colorectal cancer screening: No longer required.   Mammogram status: No longer required due to age.    Lung Cancer Screening: (Low Dose CT Chest recommended if Age 11-80 years, 20 pack-year currently smoking OR have quit w/in 15years.) does not qualify.   Lung Cancer Screening Referral:   Additional Screening:  Hepatitis C Screening: does not qualify; Completed   Vision Screening: Recommended annual ophthalmology exams for early detection of glaucoma and other disorders of the eye. Is the patient up to date with their annual eye exam?  No  Who is the provider or what is the name of the office in which the patient attends annual eye exams?  If pt is not established with a  provider, would they like to be referred to a provider to establish care? No .   Dental Screening: Recommended annual dental exams for proper oral hygiene  Diabetic Foot Exam: Diabetic Foot Exam: Completed    Community Resource Referral / Chronic Care Management: CRR required this visit?  No   CCM required this visit?  No     Plan:     I have personally reviewed and noted the following in the patient's chart:   Medical and social history Use of alcohol , tobacco or illicit drugs  Current medications and supplements including opioid prescriptions. Patient is not currently taking opioid prescriptions. Functional ability and status Nutritional status Physical activity Advanced directives List of other physicians Hospitalizations, surgeries, and ER visits in previous 12 months Vitals Screenings to include cognitive, depression, and falls Referrals and appointments  In addition, I have reviewed and discussed with patient certain preventive protocols, quality metrics, and best practice recommendations. A written personalized care plan for preventive services as well as general preventive health recommendations were provided to patient.     Barnie GORMAN Seip, NP   01/05/2024   After Visit Summary: (In Person-Declined) Patient declined AVS at this time.  Nurse Notes: this exam was performed by myself at this facility

## 2024-01-06 ENCOUNTER — Inpatient Hospital Stay

## 2024-01-06 VITALS — BP 124/66 | HR 79 | Temp 97.0°F | Resp 18

## 2024-01-06 DIAGNOSIS — C9000 Multiple myeloma not having achieved remission: Secondary | ICD-10-CM

## 2024-01-06 DIAGNOSIS — Z5112 Encounter for antineoplastic immunotherapy: Secondary | ICD-10-CM | POA: Diagnosis not present

## 2024-01-06 DIAGNOSIS — F01518 Vascular dementia, unspecified severity, with other behavioral disturbance: Secondary | ICD-10-CM | POA: Diagnosis not present

## 2024-01-06 DIAGNOSIS — R1312 Dysphagia, oropharyngeal phase: Secondary | ICD-10-CM | POA: Diagnosis not present

## 2024-01-06 DIAGNOSIS — J9601 Acute respiratory failure with hypoxia: Secondary | ICD-10-CM | POA: Diagnosis not present

## 2024-01-06 LAB — COMPREHENSIVE METABOLIC PANEL WITH GFR
ALT: 13 U/L (ref 0–44)
AST: 25 U/L (ref 15–41)
Albumin: 4.2 g/dL (ref 3.5–5.0)
Alkaline Phosphatase: 78 U/L (ref 38–126)
Anion gap: 15 (ref 5–15)
BUN: 20 mg/dL (ref 8–23)
CO2: 27 mmol/L (ref 22–32)
Calcium: 9.4 mg/dL (ref 8.9–10.3)
Chloride: 95 mmol/L — ABNORMAL LOW (ref 98–111)
Creatinine, Ser: 4.77 mg/dL — ABNORMAL HIGH (ref 0.44–1.00)
GFR, Estimated: 8 mL/min — ABNORMAL LOW (ref >60)
Glucose, Bld: 117 mg/dL — ABNORMAL HIGH (ref 70–99)
Potassium: 3.8 mmol/L (ref 3.5–5.1)
Sodium: 136 mmol/L (ref 135–145)
Total Bilirubin: 0.3 mg/dL (ref 0.0–1.2)
Total Protein: 6.9 g/dL (ref 6.5–8.1)

## 2024-01-06 LAB — CBC WITH DIFFERENTIAL/PLATELET
Abs Immature Granulocytes: 0.02 K/uL (ref 0.00–0.07)
Basophils Absolute: 0 K/uL (ref 0.0–0.1)
Basophils Relative: 0 %
Eosinophils Absolute: 0.2 K/uL (ref 0.0–0.5)
Eosinophils Relative: 5 %
HCT: 32.3 % — ABNORMAL LOW (ref 36.0–46.0)
Hemoglobin: 10.5 g/dL — ABNORMAL LOW (ref 12.0–15.0)
Immature Granulocytes: 0 %
Lymphocytes Relative: 15 %
Lymphs Abs: 0.8 K/uL (ref 0.7–4.0)
MCH: 35.5 pg — ABNORMAL HIGH (ref 26.0–34.0)
MCHC: 32.5 g/dL (ref 30.0–36.0)
MCV: 109.1 fL — ABNORMAL HIGH (ref 80.0–100.0)
Monocytes Absolute: 0.6 K/uL (ref 0.1–1.0)
Monocytes Relative: 12 %
Neutro Abs: 3.5 K/uL (ref 1.7–7.7)
Neutrophils Relative %: 68 %
Platelets: 204 K/uL (ref 150–400)
RBC: 2.96 MIL/uL — ABNORMAL LOW (ref 3.87–5.11)
RDW: 16.4 % — ABNORMAL HIGH (ref 11.5–15.5)
WBC: 5.1 K/uL (ref 4.0–10.5)
nRBC: 0 % (ref 0.0–0.2)

## 2024-01-06 LAB — MAGNESIUM: Magnesium: 2.5 mg/dL — ABNORMAL HIGH (ref 1.7–2.4)

## 2024-01-06 MED ORDER — BORTEZOMIB CHEMO SQ INJECTION 3.5 MG (2.5MG/ML)
1.2000 mg/m2 | Freq: Once | INTRAMUSCULAR | Status: AC
  Administered 2024-01-06: 2 mg via SUBCUTANEOUS
  Filled 2024-01-06: qty 0.8

## 2024-01-06 MED ORDER — PROCHLORPERAZINE MALEATE 10 MG PO TABS
10.0000 mg | ORAL_TABLET | Freq: Once | ORAL | Status: AC
  Administered 2024-01-06: 10 mg via ORAL
  Filled 2024-01-06: qty 1

## 2024-01-06 NOTE — Progress Notes (Signed)
   01/06/24 1300  Spiritual Encounters  Type of Visit Initial  Care provided to: Patient  Conversation partners present during encounter Nurse  Referral source Patient request  Reason for visit Religious ritual  OnCall Visit No  Spiritual Framework  Presenting Themes Caregiving needs;Rituals and practive   Reason for Visit: Chaplain was passing by Pt's room when she called out to me.  She thought I was a doctor but when I explained I was a chaplain she asked me to sit down and pray for her.  Description of Visit: Carly Wood is very sweet and stated that she was waiting for the people to come and take her back to [facility].   When I asked her how I should pray and what I should pray for she immediately told me about her breast cancer, but she went on from there to share that she had recently lost a brother and a sister.  I explored this with her and she is grieving the loss of her family- she is the one of just 2 of the 8 brothers and sisters she grew up with.  She also expressed grief that she may never get to see her home again as she is confined to a SNF. I spent some time listening to her story and then offer the ritual of prayer that she requested.   Carly Wood, MDiv  Chaplain, [facility] [email or web address] [phone]

## 2024-01-06 NOTE — Patient Instructions (Signed)
 [facility] [place] - A DEPT OF [place]  Discharge Instructions: Thank you for choosing [facility] to provide your oncology and hematology care.  If you have a lab appointment with the Cancer Center - please note that after April 8th, 2024, all labs will be drawn in the cancer center.  You do not have to check in or register with the main entrance as you have in the past but will complete your check-in in the cancer center.  Wear comfortable clothing and clothing appropriate for easy access to any Portacath or PICC line.   We strive to give you quality time with your provider. You may need to reschedule your appointment if you arrive late (15 or more minutes).  Arriving late affects you and other patients whose appointments are after yours.  Also, if you miss three or more appointments without notifying the office, you may be dismissed from the clinic at the provider's discretion.      For prescription refill requests, have your pharmacy contact our office and allow 72 hours for refills to be completed.    Today you received the following chemotherapy and/or immunotherapy agents velcade .       To help prevent nausea and vomiting after your treatment, we encourage you to take your nausea medication as directed.  BELOW ARE SYMPTOMS THAT SHOULD BE REPORTED IMMEDIATELY: *FEVER GREATER THAN 100.4 F (38 C) OR HIGHER *CHILLS OR SWEATING *NAUSEA AND VOMITING THAT IS NOT CONTROLLED WITH YOUR NAUSEA MEDICATION *UNUSUAL SHORTNESS OF BREATH *UNUSUAL BRUISING OR BLEEDING *URINARY PROBLEMS (pain or burning when urinating, or frequent urination) *BOWEL PROBLEMS (unusual diarrhea, constipation, pain near the anus) TENDERNESS IN MOUTH AND THROAT WITH OR WITHOUT PRESENCE OF ULCERS (sore throat, sores in mouth, or a toothache) UNUSUAL RASH, SWELLING OR PAIN  UNUSUAL VAGINAL DISCHARGE OR ITCHING   Items with * indicate a potential emergency and should be followed up  as soon as possible or go to the Emergency Department if any problems should occur.  Please show the CHEMOTHERAPY ALERT CARD or IMMUNOTHERAPY ALERT CARD at check-in to the Emergency Department and triage nurse.  Should you have questions after your visit or need to cancel or reschedule your appointment, please contact [facility] [place] - A DEPT OF [name] [phone]  and follow the prompts.  Office hours are 8:00 a.m. to 4:30 p.m. Monday - Friday. Please note that voicemails left after 4:00 p.m. may not be returned until the following business day.  We are closed weekends and major holidays. You have access to a nurse at all times for urgent questions. Please call the main number to the clinic [phone] and follow the prompts.  For any non-urgent questions, you may also contact your provider using MyChart. We now offer e-Visits for anyone 8 and older to request care online for non-urgent symptoms. For details visit [email or web address].   Also download the MyChart app! Go to the app store, search MyChart, open the app, select [place], and log in with your MyChart username and password.

## 2024-01-06 NOTE — Progress Notes (Signed)
Patient tolerated Velcade injection with no complaints voiced.  Lab work reviewed.  See MAR for details.  Injection site clean and dry with no bruising or swelling noted.  Patient stable during and after injection.  Band aid applied.  VSS.  Patient left in satisfactory condition with no s/s of distress noted. All follow ups as scheduled.  Leonard Hendler [employer]

## 2024-01-07 DIAGNOSIS — Z992 Dependence on renal dialysis: Secondary | ICD-10-CM | POA: Diagnosis not present

## 2024-01-08 DIAGNOSIS — Z992 Dependence on renal dialysis: Secondary | ICD-10-CM | POA: Diagnosis not present

## 2024-01-08 DIAGNOSIS — N186 End stage renal disease: Secondary | ICD-10-CM | POA: Diagnosis not present

## 2024-01-10 DIAGNOSIS — Z992 Dependence on renal dialysis: Secondary | ICD-10-CM | POA: Diagnosis not present

## 2024-01-10 DIAGNOSIS — N186 End stage renal disease: Secondary | ICD-10-CM | POA: Diagnosis not present

## 2024-01-12 DIAGNOSIS — N186 End stage renal disease: Secondary | ICD-10-CM | POA: Diagnosis not present

## 2024-01-13 ENCOUNTER — Inpatient Hospital Stay: Attending: Oncology

## 2024-01-13 ENCOUNTER — Inpatient Hospital Stay

## 2024-01-13 VITALS — BP 122/70 | HR 71 | Temp 97.1°F | Resp 18

## 2024-01-13 DIAGNOSIS — C9000 Multiple myeloma not having achieved remission: Secondary | ICD-10-CM | POA: Diagnosis not present

## 2024-01-13 DIAGNOSIS — Z5112 Encounter for antineoplastic immunotherapy: Secondary | ICD-10-CM | POA: Insufficient documentation

## 2024-01-13 LAB — COMPREHENSIVE METABOLIC PANEL WITH GFR
ALT: 10 U/L (ref 0–44)
AST: 21 U/L (ref 15–41)
Albumin: 4.2 g/dL (ref 3.5–5.0)
Alkaline Phosphatase: 91 U/L (ref 38–126)
Anion gap: 15 (ref 5–15)
BUN: 22 mg/dL (ref 8–23)
CO2: 28 mmol/L (ref 22–32)
Calcium: 9.6 mg/dL (ref 8.9–10.3)
Chloride: 95 mmol/L — ABNORMAL LOW (ref 98–111)
Creatinine, Ser: 5.26 mg/dL — ABNORMAL HIGH (ref 0.44–1.00)
GFR, Estimated: 7 mL/min — ABNORMAL LOW (ref >60)
Glucose, Bld: 93 mg/dL (ref 70–99)
Potassium: 3.9 mmol/L (ref 3.5–5.1)
Sodium: 138 mmol/L (ref 135–145)
Total Bilirubin: 0.3 mg/dL (ref 0.0–1.2)
Total Protein: 7 g/dL (ref 6.5–8.1)

## 2024-01-13 LAB — CBC WITH DIFFERENTIAL/PLATELET
Abs Immature Granulocytes: 0.01 K/uL (ref 0.00–0.07)
Basophils Absolute: 0 K/uL (ref 0.0–0.1)
Basophils Relative: 1 %
Eosinophils Absolute: 0.1 K/uL (ref 0.0–0.5)
Eosinophils Relative: 3 %
HCT: 33.1 % — ABNORMAL LOW (ref 36.0–46.0)
Hemoglobin: 10.6 g/dL — ABNORMAL LOW (ref 12.0–15.0)
Immature Granulocytes: 0 %
Lymphocytes Relative: 24 %
Lymphs Abs: 0.8 K/uL (ref 0.7–4.0)
MCH: 34.6 pg — ABNORMAL HIGH (ref 26.0–34.0)
MCHC: 32 g/dL (ref 30.0–36.0)
MCV: 108.2 fL — ABNORMAL HIGH (ref 80.0–100.0)
Monocytes Absolute: 0.5 K/uL (ref 0.1–1.0)
Monocytes Relative: 13 %
Neutro Abs: 2 K/uL (ref 1.7–7.7)
Neutrophils Relative %: 59 %
Platelets: 160 K/uL (ref 150–400)
RBC: 3.06 MIL/uL — ABNORMAL LOW (ref 3.87–5.11)
RDW: 15.9 % — ABNORMAL HIGH (ref 11.5–15.5)
WBC: 3.5 K/uL — ABNORMAL LOW (ref 4.0–10.5)
nRBC: 0 % (ref 0.0–0.2)

## 2024-01-13 LAB — MAGNESIUM: Magnesium: 2.5 mg/dL — ABNORMAL HIGH (ref 1.7–2.4)

## 2024-01-13 MED ORDER — PROCHLORPERAZINE MALEATE 10 MG PO TABS
10.0000 mg | ORAL_TABLET | Freq: Once | ORAL | Status: AC
  Administered 2024-01-13: 10 mg via ORAL
  Filled 2024-01-13: qty 1

## 2024-01-13 MED ORDER — BORTEZOMIB CHEMO SQ INJECTION 3.5 MG (2.5MG/ML)
1.2000 mg/m2 | Freq: Once | INTRAMUSCULAR | Status: AC
  Administered 2024-01-13: 2 mg via SUBCUTANEOUS
  Filled 2024-01-13: qty 0.8

## 2024-01-13 NOTE — Patient Instructions (Signed)
 [facility] [place] - A DEPT OF [facility]  Discharge Instructions: Thank you for choosing [facility] to provide your oncology and hematology care.  If you have a lab appointment with the Cancer Center - please note that after April 8th, 2024, all labs will be drawn in the cancer center.  You do not have to check in or register with the main entrance as you have in the past but will complete your check-in in the cancer center.  Wear comfortable clothing and clothing appropriate for easy access to any Portacath or PICC line.   We strive to give you quality time with your provider. You may need to reschedule your appointment if you arrive late (15 or more minutes).  Arriving late affects you and other patients whose appointments are after yours.  Also, if you miss three or more appointments without notifying the office, you may be dismissed from the clinic at the provider's discretion.      For prescription refill requests, have your pharmacy contact our office and allow 72 hours for refills to be completed.    Today you received the following chemotherapy and/or immunotherapy agents Velcade   To help prevent nausea and vomiting after your treatment, we encourage you to take your nausea medication as directed.  Bortezomib Injection What is this medication? BORTEZOMIB (bor TEZ oh mib) treats lymphoma. It may also be used to treat multiple myeloma, a type of bone marrow cancer. It works by blocking a protein that causes cancer cells to grow and multiply. This helps to slow or stop the spread of cancer cells. This medicine may be used for other purposes; ask your health care provider or pharmacist if you have questions. COMMON BRAND NAME(S): BORUZU, Velcade What should I tell my care team before I take this medication? They need to know if you have any of these conditions: Dehydration Diabetes Heart disease Liver disease Tingling of the fingers or toes or  other nerve disorder An unusual or allergic reaction to bortezomib, other medications, foods, dyes, or preservatives If you or your partner are pregnant or trying to get pregnant Breastfeeding How should I use this medication? This medication is injected into a vein or under the skin. It is given by your care team in a hospital or clinic setting. Talk to your care team about the use of this medication in children. Special care may be needed. Overdosage: If you think you have taken too much of this medicine contact a poison control center or emergency room at once. NOTE: This medicine is only for you. Do not share this medicine with others. What if I miss a dose? Keep appointments for follow-up doses. It is important not to miss your dose. Call your care team if you are unable to keep an appointment. What may interact with this medication? Ketoconazole Rifampin This list may not describe all possible interactions. Give your health care provider a list of all the medicines, herbs, non-prescription drugs, or dietary supplements you use. Also tell them if you smoke, drink alcohol, or use illegal drugs. Some items may interact with your medicine. What should I watch for while using this medication? Your condition will be monitored carefully while you are receiving this medication. You may need blood work while taking this medication. This medication may affect your coordination, reaction time, or judgment. Do not drive or operate machinery until you know how this medication affects you. Sit up or stand slowly to  reduce the risk of dizzy or fainting spells. Drinking alcohol with this medication can increase the risk of these side effects. This medication may increase your risk of getting an infection. Call your care team for advice if you get a fever, chills, sore throat, or other symptoms of a cold or flu. Do not treat yourself. Try to avoid being around people who are sick. Check with your care team  if you have severe diarrhea, nausea, and vomiting, or if you sweat a lot. The loss of too much body fluid may make it dangerous for you to take this medication. Talk to your care team if you may be pregnant. Serious birth defects can occur if you take this medication during pregnancy and for 7 months after the last dose. You will need a negative pregnancy test before starting this medication. Contraception is recommended while taking this medication and for 7 months after the last dose. Your care team can help you find the option that works for you. If your partner can get pregnant, use a condom during sex while taking this medication and for 4 months after the last dose. Do not breastfeed while taking this medication and for 2 months after the last dose. This medication may cause infertility. Talk to your care team if you are concerned about your fertility. What side effects may I notice from receiving this medication? Side effects that you should report to your care team as soon as possible: Allergic reactions--skin rash, itching, hives, swelling of the face, lips, tongue, or throat Bleeding--bloody or black, tar-like stools, vomiting blood or brown material that looks like coffee grounds, red or dark brown urine, small red or purple spots on skin, unusual bruising or bleeding Bleeding in the brain--severe headache, stiff neck, confusion, dizziness, change in vision, numbness or weakness of the face, arm, or leg, trouble speaking, trouble walking, vomiting Bowel blockage--stomach cramping, unable to have a bowel movement or pass gas, loss of appetite, vomiting Heart failure--shortness of breath, swelling of the ankles, feet, or hands, sudden weight gain, unusual weakness or fatigue Infection--fever, chills, cough, sore throat, wounds that don't heal, pain or trouble when passing urine, general feeling of discomfort or being unwell Liver injury--right upper belly pain, loss of appetite, nausea,  light-colored stool, dark yellow or brown urine, yellowing skin or eyes, unusual weakness or fatigue Low blood pressure--dizziness, feeling faint or lightheaded, blurry vision Lung injury--shortness of breath or trouble breathing, cough, spitting up blood, chest pain, fever Pain, tingling, or numbness in the hands or feet Severe or prolonged diarrhea Stomach pain, bloody diarrhea, pale skin, unusual weakness or fatigue, decrease in the amount of urine, which may be signs of hemolytic uremic syndrome Sudden and severe headache, confusion, change in vision, seizures, which may be signs of posterior reversible encephalopathy syndrome (PRES) TTP--purple spots on the skin or inside the mouth, pale skin, yellowing skin or eyes, unusual weakness or fatigue, fever, fast or irregular heartbeat, confusion, change in vision, trouble speaking, trouble walking Tumor lysis syndrome (TLS)--nausea, vomiting, diarrhea, decrease in the amount of urine, dark urine, unusual weakness or fatigue, confusion, muscle pain or cramps, fast or irregular heartbeat, joint pain Side effects that usually do not require medical attention (report to your care team if they continue or are bothersome): Constipation Diarrhea Fatigue Loss of appetite Nausea This list may not describe all possible side effects. Call your doctor for medical advice about side effects. You may report side effects to FDA at 1-800-FDA-1088. Where should I keep  my medication? This medication is given in a hospital or clinic. It will not be stored at home. NOTE: This sheet is a summary. It may not cover all possible information. If you have questions about this medicine, talk to your doctor, pharmacist, or health care provider.  2024 Elsevier/Gold Standard (2021-07-29 00:00:00)  BELOW ARE SYMPTOMS THAT SHOULD BE REPORTED IMMEDIATELY: *FEVER GREATER THAN 100.4 F (38 C) OR HIGHER *CHILLS OR SWEATING *NAUSEA AND VOMITING THAT IS NOT CONTROLLED WITH YOUR  NAUSEA MEDICATION *UNUSUAL SHORTNESS OF BREATH *UNUSUAL BRUISING OR BLEEDING *URINARY PROBLEMS (pain or burning when urinating, or frequent urination) *BOWEL PROBLEMS (unusual diarrhea, constipation, pain near the anus) TENDERNESS IN MOUTH AND THROAT WITH OR WITHOUT PRESENCE OF ULCERS (sore throat, sores in mouth, or a toothache) UNUSUAL RASH, SWELLING OR PAIN  UNUSUAL VAGINAL DISCHARGE OR ITCHING   Items with * indicate a potential emergency and should be followed up as soon as possible or go to the Emergency Department if any problems should occur.  Please show the CHEMOTHERAPY ALERT CARD or IMMUNOTHERAPY ALERT CARD at check-in to the Emergency Department and triage nurse.  Should you have questions after your visit or need to cancel or reschedule your appointment, please contact [facility] [place] - A DEPT OF [facility] [phone]  and follow the prompts.  Office hours are 8:00 a.m. to 4:30 p.m. Monday - Friday. Please note that voicemails left after 4:00 p.m. may not be returned until the following business day.  We are closed weekends and major holidays. You have access to a nurse at all times for urgent questions. Please call the main number to the clinic [phone] and follow the prompts.  For any non-urgent questions, you may also contact your provider using MyChart. We now offer e-Visits for anyone 40 and older to request care online for non-urgent symptoms. For details visit [email or web address].   Also download the MyChart app! Go to the app store, search "MyChart", open the app, select [place], and log in with your MyChart username and password.

## 2024-01-13 NOTE — Progress Notes (Signed)
 Patient presents today for chemotherapy Velcade  infusion.  Patient is in satisfactory condition with no new complaints voiced.  Vital signs are stable.  Labs reviewed and all labs are within treatment parameters. CMP labs are pending, Dr.Kandala made aware and stated to proceed without CMP results. We will proceed with treatment per MD orders.    Treatment given today per MD orders. Tolerated infusion without adverse affects. Vital signs stable. No complaints at this time. Discharged from clinic via wheelchair in stable condition. Alert and oriented x 3. F/U with [facility] as scheduled.

## 2024-01-14 ENCOUNTER — Other Ambulatory Visit: Payer: Self-pay | Admitting: Oncology

## 2024-01-14 DIAGNOSIS — Z992 Dependence on renal dialysis: Secondary | ICD-10-CM | POA: Diagnosis not present

## 2024-01-14 DIAGNOSIS — C9000 Multiple myeloma not having achieved remission: Secondary | ICD-10-CM

## 2024-01-20 ENCOUNTER — Inpatient Hospital Stay

## 2024-01-20 ENCOUNTER — Other Ambulatory Visit: Payer: Self-pay

## 2024-01-20 VITALS — BP 129/73 | HR 74 | Temp 98.1°F | Resp 18

## 2024-01-20 DIAGNOSIS — C9000 Multiple myeloma not having achieved remission: Secondary | ICD-10-CM

## 2024-01-20 DIAGNOSIS — Z5112 Encounter for antineoplastic immunotherapy: Secondary | ICD-10-CM | POA: Diagnosis not present

## 2024-01-20 LAB — CBC WITH DIFFERENTIAL/PLATELET
Abs Immature Granulocytes: 0.01 K/uL (ref 0.00–0.07)
Basophils Absolute: 0 K/uL (ref 0.0–0.1)
Basophils Relative: 1 %
Eosinophils Absolute: 0.2 K/uL (ref 0.0–0.5)
Eosinophils Relative: 7 %
HCT: 34.6 % — ABNORMAL LOW (ref 36.0–46.0)
Hemoglobin: 11.3 g/dL — ABNORMAL LOW (ref 12.0–15.0)
Immature Granulocytes: 0 %
Lymphocytes Relative: 22 %
Lymphs Abs: 0.7 K/uL (ref 0.7–4.0)
MCH: 35.3 pg — ABNORMAL HIGH (ref 26.0–34.0)
MCHC: 32.7 g/dL (ref 30.0–36.0)
MCV: 108.1 fL — ABNORMAL HIGH (ref 80.0–100.0)
Monocytes Absolute: 0.5 K/uL (ref 0.1–1.0)
Monocytes Relative: 15 %
Neutro Abs: 1.7 K/uL (ref 1.7–7.7)
Neutrophils Relative %: 55 %
Platelets: 151 K/uL (ref 150–400)
RBC: 3.2 MIL/uL — ABNORMAL LOW (ref 3.87–5.11)
RDW: 16.2 % — ABNORMAL HIGH (ref 11.5–15.5)
WBC: 3.1 K/uL — ABNORMAL LOW (ref 4.0–10.5)
nRBC: 0 % (ref 0.0–0.2)

## 2024-01-20 LAB — COMPREHENSIVE METABOLIC PANEL WITH GFR
ALT: 9 U/L (ref 0–44)
AST: 21 U/L (ref 15–41)
Albumin: 4.4 g/dL (ref 3.5–5.0)
Alkaline Phosphatase: 91 U/L (ref 38–126)
Anion gap: 15 (ref 5–15)
BUN: 28 mg/dL — ABNORMAL HIGH (ref 8–23)
CO2: 28 mmol/L (ref 22–32)
Calcium: 10 mg/dL (ref 8.9–10.3)
Chloride: 94 mmol/L — ABNORMAL LOW (ref 98–111)
Creatinine, Ser: 5.72 mg/dL — ABNORMAL HIGH (ref 0.44–1.00)
GFR, Estimated: 7 mL/min — ABNORMAL LOW (ref >60)
Glucose, Bld: 95 mg/dL (ref 70–99)
Potassium: 4.2 mmol/L (ref 3.5–5.1)
Sodium: 137 mmol/L (ref 135–145)
Total Bilirubin: 0.4 mg/dL (ref 0.0–1.2)
Total Protein: 7.3 g/dL (ref 6.5–8.1)

## 2024-01-20 LAB — MAGNESIUM: Magnesium: 2.8 mg/dL — ABNORMAL HIGH (ref 1.7–2.4)

## 2024-01-20 MED ORDER — PROCHLORPERAZINE MALEATE 10 MG PO TABS
10.0000 mg | ORAL_TABLET | Freq: Once | ORAL | Status: AC
  Administered 2024-01-20: 10 mg via ORAL
  Filled 2024-01-20: qty 1

## 2024-01-20 MED ORDER — BORTEZOMIB CHEMO SQ INJECTION 3.5 MG (2.5MG/ML)
1.2000 mg/m2 | Freq: Once | INTRAMUSCULAR | Status: AC
  Administered 2024-01-20: 2 mg via SUBCUTANEOUS
  Filled 2024-01-20: qty 0.8

## 2024-01-20 NOTE — Patient Instructions (Signed)
 [facility] [place] - A DEPT OF [facility]  Discharge Instructions: Thank you for choosing [facility] to provide your oncology and hematology care.  If you have a lab appointment with the Cancer Center - please note that after April 8th, 2024, all labs will be drawn in the cancer center.  You do not have to check in or register with the main entrance as you have in the past but will complete your check-in in the cancer center.  Wear comfortable clothing and clothing appropriate for easy access to any Portacath or PICC line.   We strive to give you quality time with your provider. You may need to reschedule your appointment if you arrive late (15 or more minutes).  Arriving late affects you and other patients whose appointments are after yours.  Also, if you miss three or more appointments without notifying the office, you may be dismissed from the clinic at the provider's discretion.      For prescription refill requests, have your pharmacy contact our office and allow 72 hours for refills to be completed.    Today you received the following chemotherapy and/or immunotherapy agents Velcade   To help prevent nausea and vomiting after your treatment, we encourage you to take your nausea medication as directed.  Bortezomib Injection What is this medication? BORTEZOMIB (bor TEZ oh mib) treats lymphoma. It may also be used to treat multiple myeloma, a type of bone marrow cancer. It works by blocking a protein that causes cancer cells to grow and multiply. This helps to slow or stop the spread of cancer cells. This medicine may be used for other purposes; ask your health care provider or pharmacist if you have questions. COMMON BRAND NAME(S): BORUZU, Velcade What should I tell my care team before I take this medication? They need to know if you have any of these conditions: Dehydration Diabetes Heart disease Liver disease Tingling of the fingers or toes or  other nerve disorder An unusual or allergic reaction to bortezomib, other medications, foods, dyes, or preservatives If you or your partner are pregnant or trying to get pregnant Breastfeeding How should I use this medication? This medication is injected into a vein or under the skin. It is given by your care team in a hospital or clinic setting. Talk to your care team about the use of this medication in children. Special care may be needed. Overdosage: If you think you have taken too much of this medicine contact a poison control center or emergency room at once. NOTE: This medicine is only for you. Do not share this medicine with others. What if I miss a dose? Keep appointments for follow-up doses. It is important not to miss your dose. Call your care team if you are unable to keep an appointment. What may interact with this medication? Ketoconazole Rifampin This list may not describe all possible interactions. Give your health care provider a list of all the medicines, herbs, non-prescription drugs, or dietary supplements you use. Also tell them if you smoke, drink alcohol, or use illegal drugs. Some items may interact with your medicine. What should I watch for while using this medication? Your condition will be monitored carefully while you are receiving this medication. You may need blood work while taking this medication. This medication may affect your coordination, reaction time, or judgment. Do not drive or operate machinery until you know how this medication affects you. Sit up or stand slowly to  reduce the risk of dizzy or fainting spells. Drinking alcohol with this medication can increase the risk of these side effects. This medication may increase your risk of getting an infection. Call your care team for advice if you get a fever, chills, sore throat, or other symptoms of a cold or flu. Do not treat yourself. Try to avoid being around people who are sick. Check with your care team  if you have severe diarrhea, nausea, and vomiting, or if you sweat a lot. The loss of too much body fluid may make it dangerous for you to take this medication. Talk to your care team if you may be pregnant. Serious birth defects can occur if you take this medication during pregnancy and for 7 months after the last dose. You will need a negative pregnancy test before starting this medication. Contraception is recommended while taking this medication and for 7 months after the last dose. Your care team can help you find the option that works for you. If your partner can get pregnant, use a condom during sex while taking this medication and for 4 months after the last dose. Do not breastfeed while taking this medication and for 2 months after the last dose. This medication may cause infertility. Talk to your care team if you are concerned about your fertility. What side effects may I notice from receiving this medication? Side effects that you should report to your care team as soon as possible: Allergic reactions--skin rash, itching, hives, swelling of the face, lips, tongue, or throat Bleeding--bloody or black, tar-like stools, vomiting blood or brown material that looks like coffee grounds, red or dark brown urine, small red or purple spots on skin, unusual bruising or bleeding Bleeding in the brain--severe headache, stiff neck, confusion, dizziness, change in vision, numbness or weakness of the face, arm, or leg, trouble speaking, trouble walking, vomiting Bowel blockage--stomach cramping, unable to have a bowel movement or pass gas, loss of appetite, vomiting Heart failure--shortness of breath, swelling of the ankles, feet, or hands, sudden weight gain, unusual weakness or fatigue Infection--fever, chills, cough, sore throat, wounds that don't heal, pain or trouble when passing urine, general feeling of discomfort or being unwell Liver injury--right upper belly pain, loss of appetite, nausea,  light-colored stool, dark yellow or brown urine, yellowing skin or eyes, unusual weakness or fatigue Low blood pressure--dizziness, feeling faint or lightheaded, blurry vision Lung injury--shortness of breath or trouble breathing, cough, spitting up blood, chest pain, fever Pain, tingling, or numbness in the hands or feet Severe or prolonged diarrhea Stomach pain, bloody diarrhea, pale skin, unusual weakness or fatigue, decrease in the amount of urine, which may be signs of hemolytic uremic syndrome Sudden and severe headache, confusion, change in vision, seizures, which may be signs of posterior reversible encephalopathy syndrome (PRES) TTP--purple spots on the skin or inside the mouth, pale skin, yellowing skin or eyes, unusual weakness or fatigue, fever, fast or irregular heartbeat, confusion, change in vision, trouble speaking, trouble walking Tumor lysis syndrome (TLS)--nausea, vomiting, diarrhea, decrease in the amount of urine, dark urine, unusual weakness or fatigue, confusion, muscle pain or cramps, fast or irregular heartbeat, joint pain Side effects that usually do not require medical attention (report to your care team if they continue or are bothersome): Constipation Diarrhea Fatigue Loss of appetite Nausea This list may not describe all possible side effects. Call your doctor for medical advice about side effects. You may report side effects to FDA at 1-800-FDA-1088. Where should I keep  my medication? This medication is given in a hospital or clinic. It will not be stored at home. NOTE: This sheet is a summary. It may not cover all possible information. If you have questions about this medicine, talk to your doctor, pharmacist, or health care provider.  2024 Elsevier/Gold Standard (2021-07-29 00:00:00)  BELOW ARE SYMPTOMS THAT SHOULD BE REPORTED IMMEDIATELY: *FEVER GREATER THAN 100.4 F (38 C) OR HIGHER *CHILLS OR SWEATING *NAUSEA AND VOMITING THAT IS NOT CONTROLLED WITH YOUR  NAUSEA MEDICATION *UNUSUAL SHORTNESS OF BREATH *UNUSUAL BRUISING OR BLEEDING *URINARY PROBLEMS (pain or burning when urinating, or frequent urination) *BOWEL PROBLEMS (unusual diarrhea, constipation, pain near the anus) TENDERNESS IN MOUTH AND THROAT WITH OR WITHOUT PRESENCE OF ULCERS (sore throat, sores in mouth, or a toothache) UNUSUAL RASH, SWELLING OR PAIN  UNUSUAL VAGINAL DISCHARGE OR ITCHING   Items with * indicate a potential emergency and should be followed up as soon as possible or go to the Emergency Department if any problems should occur.  Please show the CHEMOTHERAPY ALERT CARD or IMMUNOTHERAPY ALERT CARD at check-in to the Emergency Department and triage nurse.  Should you have questions after your visit or need to cancel or reschedule your appointment, please contact [facility] [place] - A DEPT OF [facility] [phone]  and follow the prompts.  Office hours are 8:00 a.m. to 4:30 p.m. Monday - Friday. Please note that voicemails left after 4:00 p.m. may not be returned until the following business day.  We are closed weekends and major holidays. You have access to a nurse at all times for urgent questions. Please call the main number to the clinic [phone] and follow the prompts.  For any non-urgent questions, you may also contact your provider using MyChart. We now offer e-Visits for anyone 40 and older to request care online for non-urgent symptoms. For details visit [email or web address].   Also download the MyChart app! Go to the app store, search "MyChart", open the app, select [place], and log in with your MyChart username and password.

## 2024-01-20 NOTE — Progress Notes (Signed)
 Patient presents today for chemotherapy infusion.  Patient is in satisfactory condition with no new complaints voiced.  Vital signs are stable.  Labs reviewed and all labs are within treatment parameters.  We will proceed with treatment per MD orders.    Treatment given today per MD orders. Tolerated infusion without adverse affects. Vital signs stable. No complaints at this time. Discharged from clinic via wheelchair in stable condition. Alert and oriented x 3. F/U with [facility] as scheduled.

## 2024-01-21 DIAGNOSIS — N186 End stage renal disease: Secondary | ICD-10-CM | POA: Diagnosis not present

## 2024-01-21 DIAGNOSIS — Z992 Dependence on renal dialysis: Secondary | ICD-10-CM | POA: Diagnosis not present

## 2024-01-25 ENCOUNTER — Non-Acute Institutional Stay (SKILLED_NURSING_FACILITY): Payer: Self-pay | Admitting: Internal Medicine

## 2024-01-25 ENCOUNTER — Encounter: Payer: Self-pay | Admitting: Internal Medicine

## 2024-01-25 DIAGNOSIS — E782 Mixed hyperlipidemia: Secondary | ICD-10-CM | POA: Diagnosis not present

## 2024-01-25 DIAGNOSIS — N186 End stage renal disease: Secondary | ICD-10-CM

## 2024-01-25 DIAGNOSIS — I10 Essential (primary) hypertension: Secondary | ICD-10-CM | POA: Diagnosis not present

## 2024-01-25 DIAGNOSIS — R5383 Other fatigue: Secondary | ICD-10-CM

## 2024-01-25 DIAGNOSIS — E1122 Type 2 diabetes mellitus with diabetic chronic kidney disease: Secondary | ICD-10-CM | POA: Diagnosis not present

## 2024-01-25 DIAGNOSIS — D509 Iron deficiency anemia, unspecified: Secondary | ICD-10-CM

## 2024-01-25 DIAGNOSIS — Z992 Dependence on renal dialysis: Secondary | ICD-10-CM | POA: Diagnosis not present

## 2024-01-25 NOTE — Assessment & Plan Note (Signed)
 08/03/2023 LDL 40 and HDL 44; both are at goal in context of diagnosis of atherosclerosis.  Update fasting lipids later this month as she remains on high-dose lipophilic statin.

## 2024-01-25 NOTE — Progress Notes (Unsigned)
 NURSING HOME LOCATION:  [facility] ROOM NUMBER:  [id number]  CODE STATUS:  DNR  PCP: [name], NP   This is a nursing facility follow up visit  of chronic medical diagnoses to document compliance with Regulation 483.30 (c) in The Long Term Care Survey Manual Phase 2 which mandates caregiver visit ( visits can alternate among physician, PA or NP as per statutes) within 10 days of 30 days / 60 days/ 90 days post admission to SNF date  .  Interim medical record and care since last SNF visit was updated with review of diagnostic studies and change in clinical status since last visit were documented.  HPI: She is a permanent resident of this facility with medical diagnoses of end-stage renal disease on dialysis; diabetes with vascular complications; dementia; GERD; glaucoma; infiltrating ductal carcinoma of the breast; essential hypertension; dyslipidemia; and multiple myeloma. This manage she has received infusions of Bortezomib   for her multiple myeloma 11/6 and 11/13.  Comprehensive metabolic profile was performed 11/13 and revealed creatinine of 5.72, BUN 28, and GFR of 7.  White blood count was 3100, down from a prior value of 3500.  Typically white count has ranged from 5.1 up to 7.5.  Macrocytic anemia had improved from 10.6/33.1 up to 11.3/34.6.  MCV was 108.1. B12 & folate were WNL @ 797 & >20 on 9/11.  Magnesium was elevated to 2.8. Last A1c was 5.1% with recent range of 4.9-5.5%. She is on relatively high-dose statin; last lipid profile was 08/03/2023 revealing an LDL of 40 and HDL of 44.  Also on that date TSH was therapeutic at 1.265. She continues to have dialysis Monday, Wednesday, and Friday.  Review of systems completion was compromised by her profound lethargy.  The patient kept nodding off to sleep during the interview with chewing on her chest.  When she was awake she remained very lethargic and was yawning repeatedly.  Staff does not report significant  snoring or apneic spells.  She states that she typically is very tired after having gone to dialysis.  She asked me his cancer coming back.  I told her that her blood work showed improvement.  I recommend that she talk to her oncologist at the next visit.  Her only complaint was my finger is bad indicating an extension deformity of the third left digit.   Constitutional: No fever, significant weight change, fatigue  Eyes: No redness, discharge, pain, vision change ENT/mouth: No nasal congestion,  purulent discharge, earache, change in hearing, sore throat  Cardiovascular: No chest pain, palpitations, paroxysmal nocturnal dyspnea, claudication, edema  Respiratory: No cough, sputum production, hemoptysis, DOE, significant snoring, apnea   Gastrointestinal: No heartburn, dysphagia, abdominal pain, nausea /vomiting, rectal bleeding, melena, change in bowels Genitourinary: No dysuria, hematuria, pyuria, incontinence, nocturia Musculoskeletal: No joint stiffness, joint swelling, weakness, pain Dermatologic: No rash, pruritus, change in appearance of skin Neurologic: No dizziness, headache, syncope, seizures, numbness, tingling Psychiatric: No significant anxiety, depression, insomnia, anorexia Endocrine: No change in hair/skin/nails, excessive thirst, excessive hunger, excessive urination  Hematologic/lymphatic: No significant bruising, lymphadenopathy, abnormal bleeding Allergy/immunology: No itchy/watery eyes, significant sneezing, urticaria, angioedema  Physical exam:  Pertinent or positive findings: As noted she was profoundly lethargic.  When awake facies were blank and she kept nodding off to sleep.  She is wearing a wig.  Eyebrows are essentially absent.  There is some crowding of the teeth.  Heart sounds are markedly distant.  Systolic murmur is suggested.  Clinically rhythm appears  to be regular.  Abdomen is protuberant.  She has 1/2+ edema.  Pedal pulses are not palpable.  Strength to  opposition is symmetric but generally weak.  There is an extension deformity of the third left DIP joint. General appearance: Adequately nourished; no acute distress, increased work of breathing is present.   Lymphatic: No lymphadenopathy about the head, neck, axilla. Eyes: No conjunctival inflammation or lid edema is present. There is no scleral icterus. Ears:  External ear exam shows no significant lesions or deformities.   Nose:  External nasal examination shows no deformity or inflammation. Nasal mucosa are pink and moist without lesions, exudates Oral exam:  Lips and gums are healthy appearing. There is no oropharyngeal erythema or exudate. Neck:  No thyromegaly, masses, tenderness noted.    Heart:  Normal rate and regular rhythm. S1 and S2 normal without gallop, murmur, click, rub .  Lungs: Chest clear to auscultation without wheezes, rhonchi, rales, rubs. Abdomen: Bowel sounds are normal. Abdomen is soft and nontender with no organomegaly, hernias, masses. GU: Deferred  Extremities:  No cyanosis, clubbing, edema  Neurologic exam : Cn 2-7 intact Strength equal  in upper & lower extremities Balance, Rhomberg, finger to nose testing could not be completed due to clinical state Deep tendon reflexes are equal Skin: Warm & dry w/o tenting. No significant lesions or rash.  See summary under each active problem in the Problem List with associated updated therapeutic plan

## 2024-01-25 NOTE — Assessment & Plan Note (Signed)
 Today's blood pressure 144/98 is an outlier; recent systolic blood pressures have ranged from 108 up to 129.  Average will be verified and medications adjusted appropriately.  Problematic is the tendency for her to have hypotension at dialysis sessions.

## 2024-01-25 NOTE — Patient Instructions (Signed)
 See assessment and plan under each diagnosis in the problem list and acutely for this visit

## 2024-01-25 NOTE — Assessment & Plan Note (Signed)
 Serially anemia is improved but macrocytosis persists.  Despite MCV of 108.1, B12 and folate levels are normal as of 9/11.  Blood counts are checked at bimonthly infusion sessions.

## 2024-01-25 NOTE — Assessment & Plan Note (Signed)
 Although the ESRD is attributed to diabetes; A1c ranges from 4.9 up to 5.5% with the most recent value being 5.1, nondiabetic.  She is on no diabetic medications.  Although discordance can occur with CKD; there is no indication for diabetic medication initiation.  She continues renal dialysis Monday, Wednesday, and Friday.

## 2024-01-27 ENCOUNTER — Inpatient Hospital Stay

## 2024-01-27 VITALS — BP 119/75 | HR 95 | Temp 97.3°F | Resp 20

## 2024-01-27 DIAGNOSIS — C9000 Multiple myeloma not having achieved remission: Secondary | ICD-10-CM

## 2024-01-27 DIAGNOSIS — Z5112 Encounter for antineoplastic immunotherapy: Secondary | ICD-10-CM | POA: Diagnosis not present

## 2024-01-27 LAB — COMPREHENSIVE METABOLIC PANEL WITH GFR
ALT: 10 U/L (ref 0–44)
AST: 23 U/L (ref 15–41)
Albumin: 4.3 g/dL (ref 3.5–5.0)
Alkaline Phosphatase: 86 U/L (ref 38–126)
Anion gap: 15 (ref 5–15)
BUN: 24 mg/dL — ABNORMAL HIGH (ref 8–23)
CO2: 28 mmol/L (ref 22–32)
Calcium: 9.8 mg/dL (ref 8.9–10.3)
Chloride: 92 mmol/L — ABNORMAL LOW (ref 98–111)
Creatinine, Ser: 5.75 mg/dL — ABNORMAL HIGH (ref 0.44–1.00)
GFR, Estimated: 7 mL/min — ABNORMAL LOW (ref >60)
Glucose, Bld: 153 mg/dL — ABNORMAL HIGH (ref 70–99)
Potassium: 4.2 mmol/L (ref 3.5–5.1)
Sodium: 135 mmol/L (ref 135–145)
Total Bilirubin: 0.4 mg/dL (ref 0.0–1.2)
Total Protein: 7 g/dL (ref 6.5–8.1)

## 2024-01-27 LAB — CBC WITH DIFFERENTIAL/PLATELET
Abs Immature Granulocytes: 0 K/uL (ref 0.00–0.07)
Basophils Absolute: 0 K/uL (ref 0.0–0.1)
Basophils Relative: 1 %
Eosinophils Absolute: 0.1 K/uL (ref 0.0–0.5)
Eosinophils Relative: 3 %
HCT: 34.2 % — ABNORMAL LOW (ref 36.0–46.0)
Hemoglobin: 11 g/dL — ABNORMAL LOW (ref 12.0–15.0)
Immature Granulocytes: 0 %
Lymphocytes Relative: 21 %
Lymphs Abs: 0.9 K/uL (ref 0.7–4.0)
MCH: 34.7 pg — ABNORMAL HIGH (ref 26.0–34.0)
MCHC: 32.2 g/dL (ref 30.0–36.0)
MCV: 107.9 fL — ABNORMAL HIGH (ref 80.0–100.0)
Monocytes Absolute: 0.5 K/uL (ref 0.1–1.0)
Monocytes Relative: 11 %
Neutro Abs: 2.6 K/uL (ref 1.7–7.7)
Neutrophils Relative %: 64 %
Platelets: 160 K/uL (ref 150–400)
RBC: 3.17 MIL/uL — ABNORMAL LOW (ref 3.87–5.11)
RDW: 15.5 % (ref 11.5–15.5)
WBC: 4.1 K/uL (ref 4.0–10.5)
nRBC: 0 % (ref 0.0–0.2)

## 2024-01-27 LAB — MAGNESIUM: Magnesium: 2.6 mg/dL — ABNORMAL HIGH (ref 1.7–2.4)

## 2024-01-27 MED ORDER — BORTEZOMIB CHEMO SQ INJECTION 3.5 MG (2.5MG/ML)
1.2000 mg/m2 | Freq: Once | INTRAMUSCULAR | Status: AC
  Administered 2024-01-27: 2 mg via SUBCUTANEOUS
  Filled 2024-01-27: qty 0.8

## 2024-01-27 MED ORDER — PROCHLORPERAZINE MALEATE 10 MG PO TABS
10.0000 mg | ORAL_TABLET | Freq: Once | ORAL | Status: AC
  Administered 2024-01-27: 10 mg via ORAL
  Filled 2024-01-27: qty 1

## 2024-01-27 NOTE — Progress Notes (Signed)
 Patient presents today for chemotherapy Velcade  injection.  Patient is in satisfactory condition with no new complaints voiced.  Vital signs are stable.  Labs reviewed and all labs are within treatment parameters.  We will proceed with treatment per MD orders.    Treatment given today per MD orders. Tolerated infusion without adverse affects. Vital signs stable. No complaints at this time. Discharged from clinic via wheelchair in stable condition. Alert and oriented x 3. F/U with [facility] as scheduled.

## 2024-01-27 NOTE — Patient Instructions (Signed)
 [facility] [place] - A DEPT OF [facility]  Discharge Instructions: Thank you for choosing [facility] to provide your oncology and hematology care.  If you have a lab appointment with the Cancer Center - please note that after April 8th, 2024, all labs will be drawn in the cancer center.  You do not have to check in or register with the main entrance as you have in the past but will complete your check-in in the cancer center.  Wear comfortable clothing and clothing appropriate for easy access to any Portacath or PICC line.   We strive to give you quality time with your provider. You may need to reschedule your appointment if you arrive late (15 or more minutes).  Arriving late affects you and other patients whose appointments are after yours.  Also, if you miss three or more appointments without notifying the office, you may be dismissed from the clinic at the provider's discretion.      For prescription refill requests, have your pharmacy contact our office and allow 72 hours for refills to be completed.    Today you received the following chemotherapy and/or immunotherapy agents Velcade   To help prevent nausea and vomiting after your treatment, we encourage you to take your nausea medication as directed.  Bortezomib Injection What is this medication? BORTEZOMIB (bor TEZ oh mib) treats lymphoma. It may also be used to treat multiple myeloma, a type of bone marrow cancer. It works by blocking a protein that causes cancer cells to grow and multiply. This helps to slow or stop the spread of cancer cells. This medicine may be used for other purposes; ask your health care provider or pharmacist if you have questions. COMMON BRAND NAME(S): BORUZU, Velcade What should I tell my care team before I take this medication? They need to know if you have any of these conditions: Dehydration Diabetes Heart disease Liver disease Tingling of the fingers or toes or  other nerve disorder An unusual or allergic reaction to bortezomib, other medications, foods, dyes, or preservatives If you or your partner are pregnant or trying to get pregnant Breastfeeding How should I use this medication? This medication is injected into a vein or under the skin. It is given by your care team in a hospital or clinic setting. Talk to your care team about the use of this medication in children. Special care may be needed. Overdosage: If you think you have taken too much of this medicine contact a poison control center or emergency room at once. NOTE: This medicine is only for you. Do not share this medicine with others. What if I miss a dose? Keep appointments for follow-up doses. It is important not to miss your dose. Call your care team if you are unable to keep an appointment. What may interact with this medication? Ketoconazole Rifampin This list may not describe all possible interactions. Give your health care provider a list of all the medicines, herbs, non-prescription drugs, or dietary supplements you use. Also tell them if you smoke, drink alcohol, or use illegal drugs. Some items may interact with your medicine. What should I watch for while using this medication? Your condition will be monitored carefully while you are receiving this medication. You may need blood work while taking this medication. This medication may affect your coordination, reaction time, or judgment. Do not drive or operate machinery until you know how this medication affects you. Sit up or stand slowly to  reduce the risk of dizzy or fainting spells. Drinking alcohol with this medication can increase the risk of these side effects. This medication may increase your risk of getting an infection. Call your care team for advice if you get a fever, chills, sore throat, or other symptoms of a cold or flu. Do not treat yourself. Try to avoid being around people who are sick. Check with your care team  if you have severe diarrhea, nausea, and vomiting, or if you sweat a lot. The loss of too much body fluid may make it dangerous for you to take this medication. Talk to your care team if you may be pregnant. Serious birth defects can occur if you take this medication during pregnancy and for 7 months after the last dose. You will need a negative pregnancy test before starting this medication. Contraception is recommended while taking this medication and for 7 months after the last dose. Your care team can help you find the option that works for you. If your partner can get pregnant, use a condom during sex while taking this medication and for 4 months after the last dose. Do not breastfeed while taking this medication and for 2 months after the last dose. This medication may cause infertility. Talk to your care team if you are concerned about your fertility. What side effects may I notice from receiving this medication? Side effects that you should report to your care team as soon as possible: Allergic reactions--skin rash, itching, hives, swelling of the face, lips, tongue, or throat Bleeding--bloody or black, tar-like stools, vomiting blood or brown material that looks like coffee grounds, red or dark brown urine, small red or purple spots on skin, unusual bruising or bleeding Bleeding in the brain--severe headache, stiff neck, confusion, dizziness, change in vision, numbness or weakness of the face, arm, or leg, trouble speaking, trouble walking, vomiting Bowel blockage--stomach cramping, unable to have a bowel movement or pass gas, loss of appetite, vomiting Heart failure--shortness of breath, swelling of the ankles, feet, or hands, sudden weight gain, unusual weakness or fatigue Infection--fever, chills, cough, sore throat, wounds that don't heal, pain or trouble when passing urine, general feeling of discomfort or being unwell Liver injury--right upper belly pain, loss of appetite, nausea,  light-colored stool, dark yellow or brown urine, yellowing skin or eyes, unusual weakness or fatigue Low blood pressure--dizziness, feeling faint or lightheaded, blurry vision Lung injury--shortness of breath or trouble breathing, cough, spitting up blood, chest pain, fever Pain, tingling, or numbness in the hands or feet Severe or prolonged diarrhea Stomach pain, bloody diarrhea, pale skin, unusual weakness or fatigue, decrease in the amount of urine, which may be signs of hemolytic uremic syndrome Sudden and severe headache, confusion, change in vision, seizures, which may be signs of posterior reversible encephalopathy syndrome (PRES) TTP--purple spots on the skin or inside the mouth, pale skin, yellowing skin or eyes, unusual weakness or fatigue, fever, fast or irregular heartbeat, confusion, change in vision, trouble speaking, trouble walking Tumor lysis syndrome (TLS)--nausea, vomiting, diarrhea, decrease in the amount of urine, dark urine, unusual weakness or fatigue, confusion, muscle pain or cramps, fast or irregular heartbeat, joint pain Side effects that usually do not require medical attention (report to your care team if they continue or are bothersome): Constipation Diarrhea Fatigue Loss of appetite Nausea This list may not describe all possible side effects. Call your doctor for medical advice about side effects. You may report side effects to FDA at 1-800-FDA-1088. Where should I keep  my medication? This medication is given in a hospital or clinic. It will not be stored at home. NOTE: This sheet is a summary. It may not cover all possible information. If you have questions about this medicine, talk to your doctor, pharmacist, or health care provider.  2024 Elsevier/Gold Standard (2021-07-29 00:00:00)  BELOW ARE SYMPTOMS THAT SHOULD BE REPORTED IMMEDIATELY: *FEVER GREATER THAN 100.4 F (38 C) OR HIGHER *CHILLS OR SWEATING *NAUSEA AND VOMITING THAT IS NOT CONTROLLED WITH YOUR  NAUSEA MEDICATION *UNUSUAL SHORTNESS OF BREATH *UNUSUAL BRUISING OR BLEEDING *URINARY PROBLEMS (pain or burning when urinating, or frequent urination) *BOWEL PROBLEMS (unusual diarrhea, constipation, pain near the anus) TENDERNESS IN MOUTH AND THROAT WITH OR WITHOUT PRESENCE OF ULCERS (sore throat, sores in mouth, or a toothache) UNUSUAL RASH, SWELLING OR PAIN  UNUSUAL VAGINAL DISCHARGE OR ITCHING   Items with * indicate a potential emergency and should be followed up as soon as possible or go to the Emergency Department if any problems should occur.  Please show the CHEMOTHERAPY ALERT CARD or IMMUNOTHERAPY ALERT CARD at check-in to the Emergency Department and triage nurse.  Should you have questions after your visit or need to cancel or reschedule your appointment, please contact [facility] [place] - A DEPT OF [facility] [phone]  and follow the prompts.  Office hours are 8:00 a.m. to 4:30 p.m. Monday - Friday. Please note that voicemails left after 4:00 p.m. may not be returned until the following business day.  We are closed weekends and major holidays. You have access to a nurse at all times for urgent questions. Please call the main number to the clinic [phone] and follow the prompts.  For any non-urgent questions, you may also contact your provider using MyChart. We now offer e-Visits for anyone 40 and older to request care online for non-urgent symptoms. For details visit [email or web address].   Also download the MyChart app! Go to the app store, search "MyChart", open the app, select [place], and log in with your MyChart username and password.

## 2024-01-31 DIAGNOSIS — Z992 Dependence on renal dialysis: Secondary | ICD-10-CM | POA: Diagnosis not present

## 2024-01-31 DIAGNOSIS — N186 End stage renal disease: Secondary | ICD-10-CM | POA: Diagnosis not present

## 2024-01-31 DIAGNOSIS — E119 Type 2 diabetes mellitus without complications: Secondary | ICD-10-CM | POA: Diagnosis not present

## 2024-02-01 ENCOUNTER — Non-Acute Institutional Stay (SKILLED_NURSING_FACILITY): Payer: Self-pay | Admitting: Adult Health

## 2024-02-01 ENCOUNTER — Encounter: Payer: Self-pay | Admitting: Adult Health

## 2024-02-01 DIAGNOSIS — I5032 Chronic diastolic (congestive) heart failure: Secondary | ICD-10-CM

## 2024-02-01 DIAGNOSIS — D509 Iron deficiency anemia, unspecified: Secondary | ICD-10-CM | POA: Diagnosis not present

## 2024-02-01 DIAGNOSIS — M810 Age-related osteoporosis without current pathological fracture: Secondary | ICD-10-CM | POA: Diagnosis not present

## 2024-02-01 NOTE — Progress Notes (Signed)
 Location:  [facility] Room Number: [id number] Place of Service:  SNF (31)   CODE STATUS: dnr   Allergies  Allergen Reactions   Ace Inhibitors Other (See Comments) and Cough    Tongue swelling Angioedema    Angiotensin Receptor Blockers Other (See Comments)    Angioedema with ACE-I   Penicillins Other (See Comments)    Unknown reaction    Chief Complaint  Patient presents with   Medical Management of Chronic Issues         Chronic diastolic congestive heart failure:  Anemia due to end stage renal disease: Post menopausal osteoporosis     HPI:  She is a 88 y.o. long term resident of this facility being seen for the management of her chronic illnesses:Chronic diastolic congestive heart failure:  Anemia due to end stage renal disease: Post menopausal osteoporosis . There are no reports of uncontrolled pain. She remain euvolemic; her hgb remains stable.    Past Medical History:  Diagnosis Date   Anemia     chronic macrocytic anemia   Anxiety    Chronic kidney disease    Chronic renal disease, stage 4, severely decreased glomerular filtration rate (GFR) between 15-29 mL/min/1.73 square meter (HCC) 08/22/2015   Complication of anesthesia    delirious after Breast Surgery   Dementia (HCC)    mild   Depression    Diabetes mellitus with ESRD (end-stage renal disease) (HCC)    type II   Dysphagia    Dyspnea    with activity   GERD (gastroesophageal reflux disease)    Glaucoma    Hyperlipidemia    Hypertension    Multiple myeloma (HCC)    Pneumonia    Stage 1 infiltrating ductal carcinoma of right female breast (HCC) 08/21/2015   ER+ PR+ HER 2 neu + (3+) T1cN0     Past Surgical History:  Procedure Laterality Date   A/V FISTULAGRAM Right 07/05/2020   Procedure: A/V Fistulagram;  Surgeon: Harvey Carlin BRAVO, MD;  [facility];  Service: Cardiovascular;  Laterality: Right;   AV FISTULA PLACEMENT Left 11/22/2017   Procedure: ARTERIOVENOUS  (AV) FISTULA CREATION LEFT ARM;  Surgeon: Harvey Carlin BRAVO, MD;  Location: [facility] OR;  Service: Vascular;  Laterality: Left;   AV FISTULA PLACEMENT Right 04/04/2020   Procedure: RIGHT ARM ARTERIOVENOUS FISTULA CREATION;  Surgeon: Oris Krystal FALCON, MD;  Location: [facility] ORS;  Service: Vascular;  Laterality: Right;   AV FISTULA PLACEMENT Right 08/20/2020   Procedure: ARTERIOVENOUS (AV) FISTULA LIGATION RIGHT ARM;  Surgeon: Harvey Carlin BRAVO, MD;  Location: [facility] OR;  Service: Vascular;  Laterality: Right;   BIOPSY  08/07/2016   Procedure: BIOPSY;  Surgeon: Shaaron [name], MD;  Location: [facility];  Service: Endoscopy;;  gastric ulcer biopsy   COLONOSCOPY     ESOPHAGOGASTRODUODENOSCOPY N/A 08/07/2016   LA Grade A esophagitis s/p dilation, small hiatal hernia, multiple gastric ulcers and erosions, duodenal erosions s/p biopsy. Negative H.pylori    ESOPHAGOGASTRODUODENOSCOPY N/A 11/27/2016   normal esophagus, previously noted gastric ulcers completely healed, normal duodenum.    ESOPHAGOGASTRODUODENOSCOPY (EGD) WITH PROPOFOL  N/A 07/23/2020   Procedure: ESOPHAGOGASTRODUODENOSCOPY (EGD) WITH PROPOFOL ;  Surgeon: Golda Claudis PENNER, MD;  [facility];  Service: Endoscopy;  Laterality: N/A;   FISTULA SUPERFICIALIZATION Left 02/14/2018   Procedure: FISTULA SUPERFICIALIZATION LEFT ARM;  Surgeon: Eliza Lonni RAMAN, MD;  Location: [facility] OR;  Service: Vascular;  Laterality: Left;   FRACTURE SURGERY Right    ankle  HOT HEMOSTASIS  07/23/2020   Procedure: HOT HEMOSTASIS (ARGON PLASMA COAGULATION/BICAP);  Surgeon: Golda Claudis PENNER, MD;  [facility];  Service: Endoscopy;;   INTRAMEDULLARY (IM) NAIL INTERTROCHANTERIC Right 07/12/2020   Procedure: INTRAMEDULLARY (IM) NAIL INTERTROCHANTRIC;  Surgeon: Onesimo Oneil LABOR, MD;  Location: [facility];  Service: Orthopedics;  Laterality: Right;   IR FLUORO GUIDE CV LINE LEFT  12/21/2022   IR US  GUIDE VASC ACCESS LEFT  12/21/2022   MALONEY DILATION N/A 08/07/2016    Procedure: AGAPITO DILATION;  Surgeon: Shaaron [name], MD;  [facility];  Service: Endoscopy;  Laterality: N/A;   MASTECTOMY, PARTIAL Right    PERIPHERAL VASCULAR BALLOON ANGIOPLASTY Left 07/13/2019   Procedure: PERIPHERAL VASCULAR BALLOON ANGIOPLASTY;  Surgeon: Gretta Lonni PARAS, MD;  [facility];  Service: Cardiovascular;  Laterality: Left;  arm fistulogram   PERIPHERAL VASCULAR BALLOON ANGIOPLASTY Right 05/22/2020   Procedure: PERIPHERAL VASCULAR BALLOON ANGIOPLASTY;  Surgeon: Magda Debby SAILOR, MD;  [facility]: [facility];  Service: Cardiovascular;  Laterality: Right;  arm fistula   PERIPHERAL VASCULAR BALLOON ANGIOPLASTY Right 07/05/2020   Procedure: PERIPHERAL VASCULAR BALLOON ANGIOPLASTY;  Surgeon: Harvey Carlin BRAVO, MD;  [facility];  Service: Cardiovascular;  Laterality: Right;  arm fistula   PORT-A-CATH REMOVAL Left 11/22/2017   Procedure: REMOVAL PORT-A-CATH LEFT CHEST;  Surgeon: Harvey Carlin BRAVO, MD;  Location: [facility] OR;  Service: Vascular;  Laterality: Left;   RETINAL DETACHMENT SURGERY Right    SCLEROTHERAPY  07/23/2020   Procedure: SCLEROTHERAPY;  Surgeon: Golda Claudis PENNER, MD;  [facility];  Service: Endoscopy;;   TUNNELLED CATHETER EXCHANGE N/A 08/16/2023   Procedure: TUNNELLED CATHETER EXCHANGE;  Surgeon: Magda Debby SAILOR, MD;  Location: [facility];  Service: Cardiovascular;  Laterality: N/A;   UPPER EXTREMITY VENOGRAPHY N/A 08/22/2020   Procedure: LEFT UPPER & CENTRAL VENOGRAPHY;  Surgeon: Gretta Lonni PARAS, MD;  [facility];  Service: Cardiovascular;  Laterality: N/A;    Social History   Socioeconomic History   Marital status: Single    Spouse name: Not on file   Number of children: Not on file   Years of education: Not on file   Highest education level: Not on file  Occupational History   Occupation: retired   Tobacco Use   Smoking status: Never   Smokeless tobacco: Never  Vaping  Use   Vaping status: Never Used  Substance and Sexual Activity   Alcohol  use: No    Alcohol /week: 0.0 standard drinks of alcohol    Drug use: No   Sexual activity: Never  Other Topics Concern   Not on file  Social History Narrative   Long term resident of SNF    Social Drivers of Health   Financial Resource Strain: Low Risk  (01/09/2020)   Overall Financial Resource Strain (CARDIA)    Difficulty of Paying Living Expenses: Not very hard  Food Insecurity: Patient Unable To Answer (12/24/2023)   Hunger Vital Sign    Worried About Running Out of Food in the Last Year: Patient unable to answer    Ran Out of Food in the Last Year: Patient unable to answer  Transportation Needs: Patient Unable To Answer (12/24/2023)   PRAPARE - Transportation    Lack of Transportation (Medical): Patient unable to answer    Lack of Transportation (Non-Medical): Patient unable to answer  Physical Activity: Inactive (01/09/2020)   Exercise Vital Sign    Days of Exercise per Week:  0 days    Minutes of Exercise per Session: 0 min  Stress: No Stress Concern Present (01/09/2020)   [employer] of Occupational Health - Occupational Stress Questionnaire    Feeling of Stress : Not at all  Social Connections: Patient Unable To Answer (12/24/2023)   Social Connection and Isolation Panel    Frequency of Communication with Friends and Family: Patient unable to answer    Frequency of Social Gatherings with Friends and Family: Patient unable to answer    Attends Religious Services: Patient unable to answer    Active Member of Clubs or Organizations: Patient unable to answer    Attends Banker Meetings: Patient unable to answer    Marital Status: Patient unable to answer  Intimate Partner Violence: Patient Unable To Answer (12/24/2023)   Humiliation, Afraid, Rape, and Kick questionnaire    Fear of Current or Ex-Partner: Patient unable to answer    Emotionally Abused: Patient unable to answer     Physically Abused: Patient unable to answer    Sexually Abused: Patient unable to answer   Family History  Problem Relation Age of Onset   Multiple myeloma Sister    Brain cancer Sister    Dementia Mother        died at 35   Stroke Mother    Heart failure Mother    Diabetes Mother    Heart disease Father    Prostate cancer Brother    Colon cancer Neg Hx       VITAL SIGNS BP 121/63   Pulse 91   Temp 98.1 F (36.7 C)   Resp 16   Ht 5' 2 (1.575 m)   Wt 141 lb (64 kg)   SpO2 97%   BMI 25.79 kg/m   Outpatient Encounter Medications as of 02/01/2024  Medication Sig   acetaminophen  (TYLENOL ) 325 MG tablet Take 650 mg by mouth 3 (three) times daily.   acyclovir  (ZOVIRAX ) 200 MG capsule Take 200 mg by mouth in the morning. (0800)   aspirin  EC 81 MG tablet Take 1 tablet (81 mg total) by mouth daily. Swallow whole.   atorvastatin  (LIPITOR) 40 MG tablet Take 1 tablet (40 mg total) by mouth daily. (Patient taking differently: Take 40 mg by mouth every evening.)   Balsam [place]-Castor Oil (VENELEX) OINT Apply topically as directed. Special Instructions: Apply to bilateral buttocks, sacrum, coccyx qshift for prevention. Every Shift Day, Evening, Night   Calcium  Carb-Cholecalciferol  (CALCIUM  + VITAMIN D3) 500-5 MG-MCG TABS Take 1 tablet by mouth daily.   melatonin 5 MG TABS Take 5 mg by mouth at bedtime.   methylPREDNISolone  (MEDROL  DOSEPAK) 4 MG TBPK tablet Medrol  Dosepak take as instructed   multivitamin (RENA-VIT) TABS tablet Take 1 tablet by mouth at bedtime.   ondansetron  (ZOFRAN -ODT) 4 MG disintegrating tablet Take 4 mg by mouth daily before breakfast.   pantoprazole  (PROTONIX ) 40 MG tablet Take 40 mg by mouth 2 (two) times daily.   sennosides-docusate sodium  (SENOKOT-S) 8.6-50 MG tablet Take 1 tablet by mouth 2 (two) times daily.   sevelamer  carbonate (RENVELA ) 800 MG tablet Take 800 mg by mouth daily. (Patient taking differently: Take 800 mg by mouth 3 (three) times daily with  meals.)   sucralfate  (CARAFATE ) 1 GM/10ML suspension Take 10 mLs by mouth 2 (two) times daily.      SIGNIFICANT DIAGNOSTIC EXAMS  LABS REVIEWED PREVIOUS      02-16-23: wbc 4.9; hgb 9.2; hct 28.8; mcv 104.7 plt 215 glucose 194; bun 32;  creat 8.17; k+ 5.1; na++ 9.8;gfr 4; protein 7.5 albumin  3.9 mag 2.6   05-27-23: uric acid 3.4 07-01-23: wbc 4.1; hgb 11.2; hct 35.2; mcv 101.4 plt 197;glucose 145; bun 22; creat 6.07; k+ 3.8; na++ 135; ca 9.4 gfr 6 protein 7.2 albumin  3.6 mag 2.4  08-26-23: wbc 4.0; hgb 10.5; hct 32.8; mcv 103.5 plt 197; glucose 116; bun 24; creat 5.78; k+ 4.2; na++ 135; ca 9.5 gfr 7; protein 7.2 albumin  3.6 mag 2.3  11-11-23: wbc 3.9; hgb 9.8; hct 30.7; mcv 108.5 plt 166; glucose 170; bun 22; creat 5.47; k+ 4.0; na++ 136; ca 9.4 gfr 7 protein 7.0 albumin  3.6; mag 2.3  12-23-23: wbc 8.2; hgb 10.7; hct 33.7; mcv 107.7 plt 234; glucose 91; bun 18; creat 5.29; k+ 3.3; na++ 140; ca 10.5; gfr 7 protein 8.4 albumin  4.8; BNP >35,000 12-27-23: wbc 5.8; hgb 9.2; hct 28.5 mcv 106.3 plt 195; glucose 163; bun 52; creat 6.89; k+ 4.1; na++ 134; ca 9.5; gfr 5; phos 3.1 albumin  4.2   TODAY  01-06-24: wbc 5.1; hgb 10.5; hct 32.3; mcv 109.1 plt 204; glucose 117; bun 20; creat 4.77; k+ 3.8; na++ 136; ca 9.4 gfr 8 protein 6.9 albumin  4.2 mag 2.5 01-27-24: wbc 4.1; hgb 11.0; hct 34.2; mcv 107.9; plt 160; glucose 153; bun 24; creat 5.75; k+ 4.2 na++ 135; ca 9.8 gfr 7; protein 7.0 albumin  4.3 mag 2.6   Review of Systems  Constitutional:  Negative for malaise/fatigue.  Respiratory:  Negative for cough and shortness of breath.   Cardiovascular:  Negative for chest pain, palpitations and leg swelling.  Gastrointestinal:  Negative for abdominal pain, constipation and heartburn.  Musculoskeletal:  Negative for back pain, joint pain and myalgias.  Skin: Negative.   Neurological:  Negative for dizziness.  Psychiatric/Behavioral:  The patient is not nervous/anxious.     Physical Exam Constitutional:       General: She is not in acute distress.    Appearance: She is well-developed. She is not diaphoretic.  Neck:     Thyroid : No thyromegaly.  Cardiovascular:     Rate and Rhythm: Normal rate and regular rhythm.     Heart sounds: Normal heart sounds.  Pulmonary:     Effort: Pulmonary effort is normal. No respiratory distress.     Breath sounds: Normal breath sounds.  Abdominal:     General: Bowel sounds are normal. There is no distension.     Palpations: Abdomen is soft.     Tenderness: There is no abdominal tenderness.  Musculoskeletal:        General: Normal range of motion.     Cervical back: Neck supple.     Right lower leg: No edema.     Left lower leg: No edema.  Lymphadenopathy:     Cervical: No cervical adenopathy.  Skin:    General: Skin is warm and dry.     Comments: Tunneled dialysis access   chest wall    Neurological:     Mental Status: She is alert. Mental status is at baseline.  Psychiatric:        Mood and Affect: Mood normal.       ASSESSMENT/PLAN  TODAY  Chronic diastolic congestive heart failure: pro BNP >35,000 while in hospital; will repeat labs EF 35-40%.   2. Anemia due to end stage renal disease: hgb 9.2  3. Post menopausal osteoporosis t score -2.957 will continue supplements  PREVIOUS   4. Stage 1 infiltrating ductal carcinoma of female right breast: is  status s/p right mastectomy; the armidex has been stopped in the hospital  5. Multiple myeloma not having achieved remission: is followed by oncology  6. Herpes: without outbreak will continue acyclovir  200 mg daily   7. Major depression chronic recurrent: will continue to monitor    8. Protein calorie malnutrition: protein 7.0; albumin  4.2; will continue supplements as directed  9. Gastroesophageal  reflux disease without esophagitis: will continue protonix  40 mg twice daily carafate  1 gm two times daily; zofran  4 mg prior to breakfast  10. Generalized chronic pain: will continue tylenol   650 mg three times daily   11. Chronic kidney disease with end stage renal disease on dialysis due to type 2 diabetes mellitus/dependence on dialysis: will continue dialysis three times weekly; renvela  800 mg daily with 1200 fluid restriction  12. Hypertension associated with end stage renal disease (ESRD) due to type 2 diabetes mellitus: b/p 121/63 will monitor   13. Hyperlipidemia associated with type 2 diabetes mellitus ldl 89 will continue lipitor 40 mg daily has history of elevated liver enzymes on statin  14. Aortic atherosclerosis/atherosclerotic peripheral vascular disease (ct 10-04-18) ldl 89  15. Vascular dementia with behavioral disturbance: weight is 135 pounds  16. Diabetes type 2 with end stage renal disease (ESRD) hgb A1c 5.1 is off medications.      Barnie Seip NP [facility]  Contact [phone] Monday through Friday 8am- 5pm  After hours call [phone]

## 2024-02-08 ENCOUNTER — Inpatient Hospital Stay: Attending: Oncology

## 2024-02-08 ENCOUNTER — Inpatient Hospital Stay

## 2024-02-08 VITALS — BP 148/70 | HR 71 | Temp 96.6°F | Resp 20

## 2024-02-08 DIAGNOSIS — C9000 Multiple myeloma not having achieved remission: Secondary | ICD-10-CM | POA: Diagnosis present

## 2024-02-08 DIAGNOSIS — Z5112 Encounter for antineoplastic immunotherapy: Secondary | ICD-10-CM | POA: Diagnosis present

## 2024-02-08 LAB — CBC WITH DIFFERENTIAL/PLATELET
Abs Immature Granulocytes: 0.01 K/uL (ref 0.00–0.07)
Basophils Absolute: 0 K/uL (ref 0.0–0.1)
Basophils Relative: 1 %
Eosinophils Absolute: 0.1 K/uL (ref 0.0–0.5)
Eosinophils Relative: 5 %
HCT: 34.1 % — ABNORMAL LOW (ref 36.0–46.0)
Hemoglobin: 11 g/dL — ABNORMAL LOW (ref 12.0–15.0)
Immature Granulocytes: 0 %
Lymphocytes Relative: 27 %
Lymphs Abs: 0.7 K/uL (ref 0.7–4.0)
MCH: 34.5 pg — ABNORMAL HIGH (ref 26.0–34.0)
MCHC: 32.3 g/dL (ref 30.0–36.0)
MCV: 106.9 fL — ABNORMAL HIGH (ref 80.0–100.0)
Monocytes Absolute: 0.4 K/uL (ref 0.1–1.0)
Monocytes Relative: 17 %
Neutro Abs: 1.3 K/uL — ABNORMAL LOW (ref 1.7–7.7)
Neutrophils Relative %: 50 %
Platelets: 168 K/uL (ref 150–400)
RBC: 3.19 MIL/uL — ABNORMAL LOW (ref 3.87–5.11)
RDW: 14.9 % (ref 11.5–15.5)
WBC: 2.7 K/uL — ABNORMAL LOW (ref 4.0–10.5)
nRBC: 0 % (ref 0.0–0.2)

## 2024-02-08 LAB — COMPREHENSIVE METABOLIC PANEL WITH GFR
ALT: 9 U/L (ref 0–44)
AST: 21 U/L (ref 15–41)
Albumin: 4.4 g/dL (ref 3.5–5.0)
Alkaline Phosphatase: 79 U/L (ref 38–126)
Anion gap: 19 — ABNORMAL HIGH (ref 5–15)
BUN: 27 mg/dL — ABNORMAL HIGH (ref 8–23)
CO2: 25 mmol/L (ref 22–32)
Calcium: 9.8 mg/dL (ref 8.9–10.3)
Chloride: 95 mmol/L — ABNORMAL LOW (ref 98–111)
Creatinine, Ser: 6.49 mg/dL — ABNORMAL HIGH (ref 0.44–1.00)
GFR, Estimated: 6 mL/min — ABNORMAL LOW (ref >60)
Glucose, Bld: 73 mg/dL (ref 70–99)
Potassium: 4.3 mmol/L (ref 3.5–5.1)
Sodium: 138 mmol/L (ref 135–145)
Total Bilirubin: 0.3 mg/dL (ref 0.0–1.2)
Total Protein: 7.2 g/dL (ref 6.5–8.1)

## 2024-02-08 LAB — MAGNESIUM: Magnesium: 2.7 mg/dL — ABNORMAL HIGH (ref 1.7–2.4)

## 2024-02-08 MED ORDER — BORTEZOMIB CHEMO SQ INJECTION 3.5 MG (2.5MG/ML)
1.2000 mg/m2 | Freq: Once | INTRAMUSCULAR | Status: AC
  Administered 2024-02-08: 2 mg via SUBCUTANEOUS
  Filled 2024-02-08: qty 0.8

## 2024-02-08 MED ORDER — PROCHLORPERAZINE MALEATE 10 MG PO TABS
10.0000 mg | ORAL_TABLET | Freq: Once | ORAL | Status: AC
  Administered 2024-02-08: 10 mg via ORAL
  Filled 2024-02-08: qty 1

## 2024-02-08 NOTE — Progress Notes (Signed)
 Patient presents today for Velcade  injection per providers order.  Vital signs within parameters for treatment.  ANC noted to be 1.3.  MD notified and message received from Isaiah Piety RN/Dr. Davonna okay to proceed with treatment.    Stable during administration without incident; injection site WNL; see MAR for injection details.  Patient tolerated procedure well and without incident.  No questions or complaints noted at this time.

## 2024-02-08 NOTE — Progress Notes (Signed)
 OK to proceed with ANC 1.3  V.O. Dr Ivery Molt, PharmD

## 2024-02-08 NOTE — Patient Instructions (Signed)
 [facility] [place] - [facility]  Discharge Instructions: Thank you for choosing [facility] to provide your oncology and hematology care.  If you have a lab appointment with the Cancer Center - please note that after April 8th, 2024, all labs will be drawn in the cancer center.  You do not have to check in or register with the main entrance as you have in the past but will complete your check-in in the cancer center.  Wear comfortable clothing and clothing appropriate for easy access to any Portacath or PICC line.   We strive to give you quality time with your provider. You may need to reschedule your appointment if you arrive late (15 or more minutes).  Arriving late affects you and other patients whose appointments are after yours.  Also, if you miss three or more appointments without notifying the office, you may be dismissed from the clinic at the provider's discretion.      For prescription refill requests, have your pharmacy contact our office and allow 72 hours for refills to be completed.    Today you received the following chemotherapy and/or immunotherapy agents Velcade      To help prevent nausea and vomiting after your treatment, we encourage you to take your nausea medication as directed.  BELOW ARE SYMPTOMS THAT SHOULD BE REPORTED IMMEDIATELY: *FEVER GREATER THAN 100.4 F (38 C) OR HIGHER *CHILLS OR SWEATING *NAUSEA AND VOMITING THAT IS NOT CONTROLLED WITH YOUR NAUSEA MEDICATION *UNUSUAL SHORTNESS OF BREATH *UNUSUAL BRUISING OR BLEEDING *URINARY PROBLEMS (pain or burning when urinating, or frequent urination) *BOWEL PROBLEMS (unusual diarrhea, constipation, pain near the anus) TENDERNESS IN MOUTH AND THROAT WITH OR WITHOUT PRESENCE OF ULCERS (sore throat, sores in mouth, or a toothache) UNUSUAL RASH, SWELLING OR PAIN  UNUSUAL VAGINAL DISCHARGE OR ITCHING   Items with * indicate a potential emergency and should be followed up  as soon as possible or go to the Emergency Department if any problems should occur.  Please show the CHEMOTHERAPY ALERT CARD or IMMUNOTHERAPY ALERT CARD at check-in to the Emergency Department and triage nurse.  Should you have questions after your visit or need to cancel or reschedule your appointment, please contact [facility] [place] - A DEPT OF [facility] [phone]  and follow the prompts.  Office hours are 8:00 a.m. to 4:30 p.m. Monday - Friday. Please note that voicemails left after 4:00 p.m. may not be returned until the following business day.  We are closed weekends and major holidays. You have access to a nurse at all times for urgent questions. Please call the main number to the clinic [phone] and follow the prompts.  For any non-urgent questions, you may also contact your provider using MyChart. We now offer e-Visits for anyone 44 and older to request care online for non-urgent symptoms. For details visit [email or web address].   Also download the MyChart app! Go to the app store, search "MyChart", open the app, select [place], and log in with your MyChart username and password.

## 2024-02-13 ENCOUNTER — Other Ambulatory Visit: Payer: Self-pay

## 2024-02-15 ENCOUNTER — Inpatient Hospital Stay

## 2024-02-15 ENCOUNTER — Encounter: Payer: Self-pay | Admitting: Oncology

## 2024-02-15 VITALS — BP 134/82 | HR 99 | Temp 97.1°F | Resp 18

## 2024-02-15 DIAGNOSIS — H25812 Combined forms of age-related cataract, left eye: Secondary | ICD-10-CM | POA: Diagnosis not present

## 2024-02-15 DIAGNOSIS — Z5112 Encounter for antineoplastic immunotherapy: Secondary | ICD-10-CM | POA: Diagnosis not present

## 2024-02-15 DIAGNOSIS — E119 Type 2 diabetes mellitus without complications: Secondary | ICD-10-CM | POA: Diagnosis not present

## 2024-02-15 DIAGNOSIS — C9000 Multiple myeloma not having achieved remission: Secondary | ICD-10-CM

## 2024-02-15 DIAGNOSIS — H31001 Unspecified chorioretinal scars, right eye: Secondary | ICD-10-CM | POA: Diagnosis not present

## 2024-02-15 LAB — CBC WITH DIFFERENTIAL/PLATELET
Abs Immature Granulocytes: 0.01 K/uL (ref 0.00–0.07)
Basophils Absolute: 0 K/uL (ref 0.0–0.1)
Basophils Relative: 1 %
Eosinophils Absolute: 0.2 K/uL (ref 0.0–0.5)
Eosinophils Relative: 5 %
HCT: 36.2 % (ref 36.0–46.0)
Hemoglobin: 11.7 g/dL — ABNORMAL LOW (ref 12.0–15.0)
Immature Granulocytes: 0 %
Lymphocytes Relative: 20 %
Lymphs Abs: 0.7 K/uL (ref 0.7–4.0)
MCH: 34.3 pg — ABNORMAL HIGH (ref 26.0–34.0)
MCHC: 32.3 g/dL (ref 30.0–36.0)
MCV: 106.2 fL — ABNORMAL HIGH (ref 80.0–100.0)
Monocytes Absolute: 0.4 K/uL (ref 0.1–1.0)
Monocytes Relative: 14 %
Neutro Abs: 1.9 K/uL (ref 1.7–7.7)
Neutrophils Relative %: 60 %
Platelets: ADEQUATE K/uL (ref 150–400)
RBC: 3.41 MIL/uL — ABNORMAL LOW (ref 3.87–5.11)
RDW: 14.7 % (ref 11.5–15.5)
WBC: 3.2 K/uL — ABNORMAL LOW (ref 4.0–10.5)
nRBC: 0 % (ref 0.0–0.2)

## 2024-02-15 LAB — COMPREHENSIVE METABOLIC PANEL WITH GFR
ALT: 12 U/L (ref 0–44)
AST: 28 U/L (ref 15–41)
Albumin: 4.2 g/dL (ref 3.5–5.0)
Alkaline Phosphatase: 82 U/L (ref 38–126)
Anion gap: 17 — ABNORMAL HIGH (ref 5–15)
BUN: 19 mg/dL (ref 8–23)
CO2: 24 mmol/L (ref 22–32)
Calcium: 10 mg/dL (ref 8.9–10.3)
Chloride: 93 mmol/L — ABNORMAL LOW (ref 98–111)
Creatinine, Ser: 5.29 mg/dL — ABNORMAL HIGH (ref 0.44–1.00)
GFR, Estimated: 7 mL/min — ABNORMAL LOW (ref >60)
Glucose, Bld: 122 mg/dL — ABNORMAL HIGH (ref 70–99)
Potassium: 4.5 mmol/L (ref 3.5–5.1)
Sodium: 135 mmol/L (ref 135–145)
Total Bilirubin: 0.3 mg/dL (ref 0.0–1.2)
Total Protein: 7.3 g/dL (ref 6.5–8.1)

## 2024-02-15 LAB — MAGNESIUM: Magnesium: 2.5 mg/dL — ABNORMAL HIGH (ref 1.7–2.4)

## 2024-02-15 MED ORDER — BORTEZOMIB CHEMO SQ INJECTION 3.5 MG (2.5MG/ML)
1.2000 mg/m2 | Freq: Once | INTRAMUSCULAR | Status: AC
  Administered 2024-02-15: 2 mg via SUBCUTANEOUS
  Filled 2024-02-15: qty 0.8

## 2024-02-15 MED ORDER — PROCHLORPERAZINE MALEATE 10 MG PO TABS
10.0000 mg | ORAL_TABLET | Freq: Once | ORAL | Status: AC
  Administered 2024-02-15: 10 mg via ORAL
  Filled 2024-02-15: qty 1

## 2024-02-15 NOTE — Progress Notes (Signed)
Patient presents today for Velcade injection per providers order.  Vital signs and labs within parameters for treatment.  Patient has no new complaints at this time.  Stable during administration without incident; injection site WNL; see MAR for injection details.  Patient tolerated procedure well and without incident.  No questions or complaints noted at this time.

## 2024-02-15 NOTE — Patient Instructions (Signed)
 [facility] [place] - [facility]  Discharge Instructions: Thank you for choosing [facility] to provide your oncology and hematology care.  If you have a lab appointment with the Cancer Center - please note that after April 8th, 2024, all labs will be drawn in the cancer center.  You do not have to check in or register with the main entrance as you have in the past but will complete your check-in in the cancer center.  Wear comfortable clothing and clothing appropriate for easy access to any Portacath or PICC line.   We strive to give you quality time with your provider. You may need to reschedule your appointment if you arrive late (15 or more minutes).  Arriving late affects you and other patients whose appointments are after yours.  Also, if you miss three or more appointments without notifying the office, you may be dismissed from the clinic at the provider's discretion.      For prescription refill requests, have your pharmacy contact our office and allow 72 hours for refills to be completed.    Today you received the following chemotherapy and/or immunotherapy agents Velcade      To help prevent nausea and vomiting after your treatment, we encourage you to take your nausea medication as directed.  BELOW ARE SYMPTOMS THAT SHOULD BE REPORTED IMMEDIATELY: *FEVER GREATER THAN 100.4 F (38 C) OR HIGHER *CHILLS OR SWEATING *NAUSEA AND VOMITING THAT IS NOT CONTROLLED WITH YOUR NAUSEA MEDICATION *UNUSUAL SHORTNESS OF BREATH *UNUSUAL BRUISING OR BLEEDING *URINARY PROBLEMS (pain or burning when urinating, or frequent urination) *BOWEL PROBLEMS (unusual diarrhea, constipation, pain near the anus) TENDERNESS IN MOUTH AND THROAT WITH OR WITHOUT PRESENCE OF ULCERS (sore throat, sores in mouth, or a toothache) UNUSUAL RASH, SWELLING OR PAIN  UNUSUAL VAGINAL DISCHARGE OR ITCHING   Items with * indicate a potential emergency and should be followed up  as soon as possible or go to the Emergency Department if any problems should occur.  Please show the CHEMOTHERAPY ALERT CARD or IMMUNOTHERAPY ALERT CARD at check-in to the Emergency Department and triage nurse.  Should you have questions after your visit or need to cancel or reschedule your appointment, please contact [facility] [place] - A DEPT OF [facility] [phone]  and follow the prompts.  Office hours are 8:00 a.m. to 4:30 p.m. Monday - Friday. Please note that voicemails left after 4:00 p.m. may not be returned until the following business day.  We are closed weekends and major holidays. You have access to a nurse at all times for urgent questions. Please call the main number to the clinic [phone] and follow the prompts.  For any non-urgent questions, you may also contact your provider using MyChart. We now offer e-Visits for anyone 44 and older to request care online for non-urgent symptoms. For details visit [email or web address].   Also download the MyChart app! Go to the app store, search "MyChart", open the app, select [place], and log in with your MyChart username and password.

## 2024-02-22 ENCOUNTER — Other Ambulatory Visit: Payer: Self-pay | Admitting: Oncology

## 2024-02-22 ENCOUNTER — Inpatient Hospital Stay

## 2024-02-22 VITALS — BP 142/69 | HR 88 | Temp 97.2°F | Resp 20

## 2024-02-22 DIAGNOSIS — C9000 Multiple myeloma not having achieved remission: Secondary | ICD-10-CM

## 2024-02-22 DIAGNOSIS — Z5112 Encounter for antineoplastic immunotherapy: Secondary | ICD-10-CM | POA: Diagnosis not present

## 2024-02-22 LAB — COMPREHENSIVE METABOLIC PANEL WITH GFR
ALT: 11 U/L (ref 0–44)
AST: 22 U/L (ref 15–41)
Albumin: 4.3 g/dL (ref 3.5–5.0)
Alkaline Phosphatase: 81 U/L (ref 38–126)
Anion gap: 18 — ABNORMAL HIGH (ref 5–15)
BUN: 28 mg/dL — ABNORMAL HIGH (ref 8–23)
CO2: 23 mmol/L (ref 22–32)
Calcium: 9.7 mg/dL (ref 8.9–10.3)
Chloride: 94 mmol/L — ABNORMAL LOW (ref 98–111)
Creatinine, Ser: 6.27 mg/dL — ABNORMAL HIGH (ref 0.44–1.00)
GFR, Estimated: 6 mL/min — ABNORMAL LOW (ref >60)
Glucose, Bld: 189 mg/dL — ABNORMAL HIGH (ref 70–99)
Potassium: 4.1 mmol/L (ref 3.5–5.1)
Sodium: 135 mmol/L (ref 135–145)
Total Bilirubin: 0.4 mg/dL (ref 0.0–1.2)
Total Protein: 7 g/dL (ref 6.5–8.1)

## 2024-02-22 LAB — CBC WITH DIFFERENTIAL/PLATELET
Abs Immature Granulocytes: 0.01 K/uL (ref 0.00–0.07)
Basophils Absolute: 0 K/uL (ref 0.0–0.1)
Basophils Relative: 1 %
Eosinophils Absolute: 0.1 K/uL (ref 0.0–0.5)
Eosinophils Relative: 3 %
HCT: 32.1 % — ABNORMAL LOW (ref 36.0–46.0)
Hemoglobin: 10.2 g/dL — ABNORMAL LOW (ref 12.0–15.0)
Immature Granulocytes: 0 %
Lymphocytes Relative: 18 %
Lymphs Abs: 0.8 K/uL (ref 0.7–4.0)
MCH: 33.6 pg (ref 26.0–34.0)
MCHC: 31.8 g/dL (ref 30.0–36.0)
MCV: 105.6 fL — ABNORMAL HIGH (ref 80.0–100.0)
Monocytes Absolute: 0.4 K/uL (ref 0.1–1.0)
Monocytes Relative: 10 %
Neutro Abs: 3.1 K/uL (ref 1.7–7.7)
Neutrophils Relative %: 68 %
Platelets: 189 K/uL (ref 150–400)
RBC: 3.04 MIL/uL — ABNORMAL LOW (ref 3.87–5.11)
RDW: 15.1 % (ref 11.5–15.5)
WBC: 4.5 K/uL (ref 4.0–10.5)
nRBC: 0 % (ref 0.0–0.2)

## 2024-02-22 LAB — MAGNESIUM: Magnesium: 2.5 mg/dL — ABNORMAL HIGH (ref 1.7–2.4)

## 2024-02-22 MED ORDER — PROCHLORPERAZINE MALEATE 10 MG PO TABS
10.0000 mg | ORAL_TABLET | Freq: Once | ORAL | Status: AC
  Administered 2024-02-22: 13:00:00 10 mg via ORAL
  Filled 2024-02-22: qty 1

## 2024-02-22 MED ORDER — BORTEZOMIB CHEMO SQ INJECTION 3.5 MG (2.5MG/ML)
1.2000 mg/m2 | Freq: Once | INTRAMUSCULAR | Status: AC
  Administered 2024-02-22: 14:00:00 2 mg via SUBCUTANEOUS
  Filled 2024-02-22: qty 0.8

## 2024-02-22 NOTE — Progress Notes (Signed)
 Patient presents today for chemotherapy Velcade  infusion. Patient is in satisfactory condition with no new complaints voiced.  Vital signs are stable.  Labs reviewed by Dr. Davonna during the office visit and all labs are within treatment parameters.  We will proceed with treatment per MD orders.   Treatment given today per MD orders. Tolerated infusion without adverse affects. Vital signs stable. No complaints at this time. Discharged from clinic ambulatory via wheelchair in stable condition. Alert and oriented x 3. F/U with [facility] as scheduled.

## 2024-02-23 LAB — KAPPA/LAMBDA LIGHT CHAINS
Kappa free light chain: 160.6 mg/L — ABNORMAL HIGH (ref 3.3–19.4)
Kappa, lambda light chain ratio: 0.29 (ref 0.26–1.65)
Lambda free light chains: 557.9 mg/L — ABNORMAL HIGH (ref 5.7–26.3)

## 2024-02-25 ENCOUNTER — Other Ambulatory Visit: Payer: Self-pay

## 2024-02-28 ENCOUNTER — Non-Acute Institutional Stay (SKILLED_NURSING_FACILITY): Payer: Self-pay | Admitting: Adult Health

## 2024-02-28 ENCOUNTER — Encounter: Payer: Self-pay | Admitting: Adult Health

## 2024-02-28 DIAGNOSIS — Z853 Personal history of malignant neoplasm of breast: Secondary | ICD-10-CM | POA: Insufficient documentation

## 2024-02-28 DIAGNOSIS — B009 Herpesviral infection, unspecified: Secondary | ICD-10-CM | POA: Diagnosis not present

## 2024-02-28 DIAGNOSIS — C9000 Multiple myeloma not having achieved remission: Secondary | ICD-10-CM

## 2024-02-28 NOTE — Progress Notes (Signed)
 " Location:  [facility] Room Number: 147 Place of Service:  SNF (31)   CODE STATUS: dnr   Allergies[1]  Chief Complaint  Patient presents with   Medical Management of Chronic Issues             History of right breast cancer: Multiple myeloma not having achieved remission: Herpes    HPI:  She is a 88 y.o. long term resident of this facility being seen for the management of her chronic illnesses:History of right breast cancer: Multiple myeloma not having achieved remission: Herpes. No reports of uncontrolled pain. There have been no herpes outbreaks. Her weight remains stable.    Past Medical History:  Diagnosis Date   Anemia     chronic macrocytic anemia   Anxiety    Chronic kidney disease    Chronic renal disease, stage 4, severely decreased glomerular filtration rate (GFR) between 15-29 mL/min/1.73 square meter (HCC) 08/22/2015   Complication of anesthesia    delirious after Breast Surgery   Dementia (HCC)    mild   Depression    Diabetes mellitus with ESRD (end-stage renal disease) (HCC)    type II   Dysphagia    Dyspnea    with activity   GERD (gastroesophageal reflux disease)    Glaucoma    Hyperlipidemia    Hypertension    Multiple myeloma (HCC)    Pneumonia    Stage 1 infiltrating ductal carcinoma of right female breast (HCC) 08/21/2015   ER+ PR+ HER 2 neu + (3+) T1cN0     Past Surgical History:  Procedure Laterality Date   A/V FISTULAGRAM Right 07/05/2020   Procedure: A/V Fistulagram;  Surgeon: Harvey Carlin BRAVO, MD;  Location: [facility];  Service: Cardiovascular;  Laterality: Right;   AV FISTULA PLACEMENT Left 11/22/2017   Procedure: ARTERIOVENOUS (AV) FISTULA CREATION LEFT ARM;  Surgeon: Harvey Carlin BRAVO, MD;  Location: [facility] OR;  Service: Vascular;  Laterality: Left;   AV FISTULA PLACEMENT Right 04/04/2020   Procedure: RIGHT ARM ARTERIOVENOUS FISTULA CREATION;  Surgeon: Oris Krystal FALCON, MD;  Location: [facility];  Service:  Vascular;  Laterality: Right;   AV FISTULA PLACEMENT Right 08/20/2020   Procedure: ARTERIOVENOUS (AV) FISTULA LIGATION RIGHT ARM;  Surgeon: Harvey Carlin BRAVO, MD;  Location: [facility] OR;  Service: Vascular;  Laterality: Right;   BIOPSY  08/07/2016   Procedure: BIOPSY;  Surgeon: Shaaron [name], MD;  Location: [facility];  Service: Endoscopy;;  gastric ulcer biopsy   COLONOSCOPY     ESOPHAGOGASTRODUODENOSCOPY N/A 08/07/2016   LA Grade A esophagitis s/p dilation, small hiatal hernia, multiple gastric ulcers and erosions, duodenal erosions s/p biopsy. Negative H.pylori    ESOPHAGOGASTRODUODENOSCOPY N/A 11/27/2016   normal esophagus, previously noted gastric ulcers completely healed, normal duodenum.    ESOPHAGOGASTRODUODENOSCOPY (EGD) WITH PROPOFOL  N/A 07/23/2020   Procedure: ESOPHAGOGASTRODUODENOSCOPY (EGD) WITH PROPOFOL ;  Surgeon: Golda Claudis PENNER, MD;  Location: [facility];  Service: Endoscopy;  Laterality: N/A;   FISTULA SUPERFICIALIZATION Left 02/14/2018   Procedure: FISTULA SUPERFICIALIZATION LEFT ARM;  Surgeon: Eliza Lonni RAMAN, MD;  Location: [facility] OR;  Service: Vascular;  Laterality: Left;   FRACTURE SURGERY Right    ankle   HOT HEMOSTASIS  07/23/2020   Procedure: HOT HEMOSTASIS (ARGON PLASMA COAGULATION/BICAP);  Surgeon: Golda Claudis PENNER, MD;  Location: [facility];  Service: Endoscopy;;   INTRAMEDULLARY (IM) NAIL INTERTROCHANTERIC Right 07/12/2020   Procedure: INTRAMEDULLARY (IM) NAIL INTERTROCHANTRIC;  Surgeon: Onesimo Oneil LABOR, MD;  Location: [facility];  Service: Orthopedics;  Laterality: Right;   IR FLUORO GUIDE CV LINE LEFT  12/21/2022   IR US  GUIDE VASC ACCESS LEFT  12/21/2022   MALONEY DILATION N/A 08/07/2016   Procedure: AGAPITO DILATION;  Surgeon: Shaaron [name], MD;  Location: [facility];  Service: Endoscopy;  Laterality: N/A;   MASTECTOMY, PARTIAL Right    PERIPHERAL VASCULAR BALLOON ANGIOPLASTY Left 07/13/2019   Procedure: PERIPHERAL VASCULAR BALLOON ANGIOPLASTY;   Surgeon: Gretta Lonni PARAS, MD;  Location: [facility];  Service: Cardiovascular;  Laterality: Left;  arm fistulogram   PERIPHERAL VASCULAR BALLOON ANGIOPLASTY Right 05/22/2020   Procedure: PERIPHERAL VASCULAR BALLOON ANGIOPLASTY;  Surgeon: Magda Debby SAILOR, MD;  Location: [facility];  Service: Cardiovascular;  Laterality: Right;  arm fistula   PERIPHERAL VASCULAR BALLOON ANGIOPLASTY Right 07/05/2020   Procedure: PERIPHERAL VASCULAR BALLOON ANGIOPLASTY;  Surgeon: Harvey Carlin BRAVO, MD;  Location: [facility];  Service: Cardiovascular;  Laterality: Right;  arm fistula   PORT-A-CATH REMOVAL Left 11/22/2017   Procedure: REMOVAL PORT-A-CATH LEFT CHEST;  Surgeon: Harvey Carlin BRAVO, MD;  Location: [facility] OR;  Service: Vascular;  Laterality: Left;   RETINAL DETACHMENT SURGERY Right    SCLEROTHERAPY  07/23/2020   Procedure: SCLEROTHERAPY;  Surgeon: Golda Claudis PENNER, MD;  Location: [facility];  Service: Endoscopy;;   TUNNELLED CATHETER EXCHANGE N/A 08/16/2023   Procedure: TUNNELLED CATHETER EXCHANGE;  Surgeon: Magda Debby SAILOR, MD;  Location: [facility];  Service: Cardiovascular;  Laterality: N/A;   UPPER EXTREMITY VENOGRAPHY N/A 08/22/2020   Procedure: LEFT UPPER & CENTRAL VENOGRAPHY;  Surgeon: Gretta Lonni PARAS, MD;  [facility]: [facility];  Service: Cardiovascular;  Laterality: N/A;    Social History   Socioeconomic History   Marital status: Single    Spouse name: Not on file   Number of children: Not on file   Years of education: Not on file   Highest education level: Not on file  Occupational History   Occupation: retired   Tobacco Use   Smoking status: Never   Smokeless tobacco: Never  Vaping Use   Vaping status: Never Used  Substance and Sexual Activity   Alcohol use: No    Alcohol/week: 0.0 standard drinks of alcohol   Drug use: No   Sexual activity: Never  Other Topics Concern   Not on file  Social History Narrative   Long term resident of SNF     Social Drivers of Health   Tobacco Use: Low Risk (02/28/2024)   Patient History    Smoking Tobacco Use: Never    Smokeless Tobacco Use: Never    Passive Exposure: Not on file  Financial Resource Strain: Not on file  Food Insecurity: Patient Unable To Answer (12/24/2023)   Epic    Worried About Programme Researcher, Broadcasting/film/video in the Last Year: Patient unable to answer    Ran Out of Food in the Last Year: Patient unable to answer  Transportation Needs: Patient Unable To Answer (12/24/2023)   Epic    Lack of Transportation (Medical): Patient unable to answer    Lack of Transportation (Non-Medical): Patient unable to answer  Physical Activity: Not on file  Stress: Not on file  Social Connections: Patient Unable To Answer (12/24/2023)   Social Connection and Isolation Panel    Frequency of Communication with Friends and Family: Patient unable to answer    Frequency of Social Gatherings with Friends and Family: Patient unable to answer  Attends Religious Services: Patient unable to answer    Active Member of Clubs or Organizations: Patient unable to answer    Attends Club or Organization Meetings: Patient unable to answer    Marital Status: Patient unable to answer  Intimate Partner Violence: Patient Unable To Answer (12/24/2023)   Epic    Fear of Current or Ex-Partner: Patient unable to answer    Emotionally Abused: Patient unable to answer    Physically Abused: Patient unable to answer    Sexually Abused: Patient unable to answer  Depression (PHQ2-9): Low Risk (02/22/2024)   Depression (PHQ2-9)    PHQ-2 Score: 0  Alcohol Screen: Not on file  Housing: Patient Unable To Answer (12/24/2023)   Epic    Unable to Pay for Housing in the Last Year: Patient unable to answer    Number of Times Moved in the Last Year: Not on file    Homeless in the Last Year: Patient unable to answer  Utilities: Patient Unable To Answer (12/24/2023)   Epic    Threatened with loss of utilities: Patient unable to  answer  Health Literacy: Not on file   Family History  Problem Relation Age of Onset   Multiple myeloma Sister    Brain cancer Sister    Dementia Mother        died at 83   Stroke Mother    Heart failure Mother    Diabetes Mother    Heart disease Father    Prostate cancer Brother    Colon cancer Neg Hx       VITAL SIGNS BP 138/64   Pulse 88   Temp 97.7 F (36.5 C)   Resp 18   Ht 5' 2 (1.575 m)   Wt 138 lb 3.2 oz (62.7 kg)   SpO2 96%   BMI 25.28 kg/m   Outpatient Encounter Medications as of 02/28/2024  Medication Sig   acetaminophen  (TYLENOL ) 325 MG tablet Take 650 mg by mouth 3 (three) times daily.   acyclovir  (ZOVIRAX ) 200 MG capsule Take 200 mg by mouth in the morning. (0800)   aspirin  EC 81 MG tablet Take 1 tablet (81 mg total) by mouth daily. Swallow whole.   atorvastatin  (LIPITOR) 40 MG tablet Take 1 tablet (40 mg total) by mouth daily. (Patient taking differently: Take 40 mg by mouth every evening.)   Balsam [place]-Castor Oil (VENELEX) OINT Apply topically as directed. Special Instructions: Apply to bilateral buttocks, sacrum, coccyx qshift for prevention. Every Shift Day, Evening, Night   Calcium  Carb-Cholecalciferol  (CALCIUM  + VITAMIN D3) 500-5 MG-MCG TABS Take 1 tablet by mouth daily.   melatonin 5 MG TABS Take 5 mg by mouth at bedtime.   multivitamin (RENA-VIT) TABS tablet Take 1 tablet by mouth at bedtime.   ondansetron  (ZOFRAN -ODT) 4 MG disintegrating tablet Take 4 mg by mouth daily before breakfast.   pantoprazole  (PROTONIX ) 40 MG tablet Take 40 mg by mouth 2 (two) times daily.   sennosides-docusate sodium  (SENOKOT-S) 8.6-50 MG tablet Take 1 tablet by mouth 2 (two) times daily.   sevelamer  carbonate (RENVELA ) 800 MG tablet Take 800 mg by mouth daily. (Patient taking differently: Take 800 mg by mouth 3 (three) times daily with meals.)   sucralfate  (CARAFATE ) 1 GM/10ML suspension Take 10 mLs by mouth 2 (two) times daily.      SIGNIFICANT DIAGNOSTIC  EXAMS  LABS REVIEWED PREVIOUS      02-16-23: wbc 4.9; hgb 9.2; hct 28.8; mcv 104.7 plt 215 glucose 194; bun 32; creat 8.17;  k+ 5.1; na++ 9.8;gfr 4; protein 7.5 albumin  3.9 mag 2.6   05-27-23: uric acid 3.4 07-01-23: wbc 4.1; hgb 11.2; hct 35.2; mcv 101.4 plt 197;glucose 145; bun 22; creat 6.07; k+ 3.8; na++ 135; ca 9.4 gfr 6 protein 7.2 albumin  3.6 mag 2.4  08-26-23: wbc 4.0; hgb 10.5; hct 32.8; mcv 103.5 plt 197; glucose 116; bun 24; creat 5.78; k+ 4.2; na++ 135; ca 9.5 gfr 7; protein 7.2 albumin  3.6 mag 2.3  11-11-23: wbc 3.9; hgb 9.8; hct 30.7; mcv 108.5 plt 166; glucose 170; bun 22; creat 5.47; k+ 4.0; na++ 136; ca 9.4 gfr 7 protein 7.0 albumin  3.6; mag 2.3  12-23-23: wbc 8.2; hgb 10.7; hct 33.7; mcv 107.7 plt 234; glucose 91; bun 18; creat 5.29; k+ 3.3; na++ 140; ca 10.5; gfr 7 protein 8.4 albumin  4.8; BNP >35,000 12-27-23: wbc 5.8; hgb 9.2; hct 28.5 mcv 106.3 plt 195; glucose 163; bun 52; creat 6.89; k+ 4.1; na++ 134; ca 9.5; gfr 5; phos 3.1 albumin  4.2   TODAY  01-06-24: wbc 5.1; hgb 10.5; hct 32.3; mcv 109.1 plt 204; glucose 117; bun 20; creat 4.77; k+ 3.8; na++ 136; ca 9.4 gfr 8 protein 6.9 albumin  4.2 mag 2.5 01-27-24: wbc 4.1; hgb 11.0; hct 34.2; mcv 107.9; plt 160; glucose 153; bun 24; creat 5.75; k+ 4.2 na++ 135; ca 9.8 gfr 7; protein 7.0 albumin  4.3 mag 2.6   Review of Systems  Constitutional:  Negative for malaise/fatigue.  Respiratory:  Negative for cough and shortness of breath.   Cardiovascular:  Negative for chest pain, palpitations and leg swelling.  Gastrointestinal:  Negative for abdominal pain, constipation and heartburn.  Musculoskeletal:  Negative for back pain, joint pain and myalgias.  Skin: Negative.   Neurological:  Negative for dizziness.  Psychiatric/Behavioral:  The patient is not nervous/anxious.      Physical Exam Constitutional:      General: She is not in acute distress.    Appearance: She is well-developed. She is not diaphoretic.  Neck:     Thyroid :  No thyromegaly.  Cardiovascular:     Rate and Rhythm: Normal rate and regular rhythm.     Pulses: Normal pulses.     Heart sounds: Normal heart sounds.  Pulmonary:     Effort: Pulmonary effort is normal. No respiratory distress.     Breath sounds: Normal breath sounds.  Abdominal:     General: Bowel sounds are normal. There is no distension.     Palpations: Abdomen is soft.     Tenderness: There is no abdominal tenderness.  Musculoskeletal:        General: Normal range of motion.     Cervical back: Neck supple.     Right lower leg: No edema.     Left lower leg: No edema.  Lymphadenopathy:     Cervical: No cervical adenopathy.  Skin:    General: Skin is warm and dry.     Comments: Tunneled dialysis access   chest wall     Neurological:     Mental Status: She is alert. Mental status is at baseline.  Psychiatric:        Mood and Affect: Mood normal.       ASSESSMENT/PLAN  TODAY  History of right breast cancer: her armidex therapy has been stopped.   2. Multiple myeloma not having achieved remission: is followed by oncology  3. Herpes: without oubreak; will continue acyclovir  200 mg daily   PREVIOUS   4. Major depression chronic recurrent: will continue  to monitor    5. Protein calorie malnutrition: protein 7.0; albumin  4.2; will continue supplements as directed  6. Gastroesophageal  reflux disease without esophagitis: will continue protonix  40 mg twice daily carafate  1 gm two times daily; zofran  4 mg prior to breakfast  7. Generalized chronic pain: will continue tylenol  650 mg three times daily   8. Chronic kidney disease with end stage renal disease on dialysis due to type 2 diabetes mellitus/dependence on dialysis: will continue dialysis three times weekly; renvela  800 mg daily with 1200 fluid restriction  9. Hypertension associated with end stage renal disease (ESRD) due to type 2 diabetes mellitus: b/p 138/64 will monitor   10. Hyperlipidemia associated with  type 2 diabetes mellitus ldl 89 will continue lipitor 40 mg daily has history of elevated liver enzymes on statin  11. Aortic atherosclerosis/atherosclerotic peripheral vascular disease (ct 10-04-18) ldl 89  12. Vascular dementia with behavioral disturbance: weight is 138 pounds  13. Diabetes type 2 with end stage renal disease (ESRD) hgb A1c 5.1 is off medications.   14. Chronic diastolic congestive heart failure: pro BNP >35,000 while in hospital; will EF 35-40%.   15. Anemia due to end stage renal disease: hgb 9.2  16. Post menopausal osteoporosis t score -2.957 will continue supplements      Barnie Seip NP [facility]  call [phone]     [1]  Allergies Allergen Reactions   Ace Inhibitors Other (See Comments) and Cough    Tongue swelling Angioedema    Angiotensin Receptor Blockers Other (See Comments)    Angioedema with ACE-I   Penicillins Other (See Comments)    Unknown reaction   "

## 2024-02-29 ENCOUNTER — Inpatient Hospital Stay

## 2024-02-29 ENCOUNTER — Inpatient Hospital Stay: Admitting: Oncology

## 2024-03-01 ENCOUNTER — Other Ambulatory Visit: Payer: Self-pay

## 2024-03-05 ENCOUNTER — Other Ambulatory Visit: Payer: Self-pay | Admitting: *Deleted

## 2024-03-05 DIAGNOSIS — C9000 Multiple myeloma not having achieved remission: Secondary | ICD-10-CM

## 2024-03-05 NOTE — Progress Notes (Signed)
 Orders placed for SPEP as it didn't get drawn at last appt.   Will draw at this weeks appt to have results by provider visit week after next.

## 2024-03-07 ENCOUNTER — Inpatient Hospital Stay

## 2024-03-07 ENCOUNTER — Other Ambulatory Visit (HOSPITAL_COMMUNITY)
Admission: RE | Admit: 2024-03-07 | Discharge: 2024-03-07 | Disposition: A | Discharge status: Home / Self Care | Source: Skilled Nursing Facility | Attending: Adult Health | Admitting: Adult Health

## 2024-03-07 VITALS — BP 126/58 | HR 88 | Temp 96.2°F | Resp 18

## 2024-03-07 DIAGNOSIS — Z5112 Encounter for antineoplastic immunotherapy: Secondary | ICD-10-CM | POA: Diagnosis not present

## 2024-03-07 DIAGNOSIS — C9000 Multiple myeloma not having achieved remission: Secondary | ICD-10-CM

## 2024-03-07 DIAGNOSIS — N186 End stage renal disease: Secondary | ICD-10-CM | POA: Diagnosis present

## 2024-03-07 LAB — CBC WITH DIFFERENTIAL/PLATELET
Abs Immature Granulocytes: 0.01 K/uL (ref 0.00–0.07)
Basophils Absolute: 0 K/uL (ref 0.0–0.1)
Basophils Relative: 1 %
Eosinophils Absolute: 0.2 K/uL (ref 0.0–0.5)
Eosinophils Relative: 5 %
HCT: 33 % — ABNORMAL LOW (ref 36.0–46.0)
Hemoglobin: 10.7 g/dL — ABNORMAL LOW (ref 12.0–15.0)
Immature Granulocytes: 0 %
Lymphocytes Relative: 19 %
Lymphs Abs: 0.7 K/uL (ref 0.7–4.0)
MCH: 34.2 pg — ABNORMAL HIGH (ref 26.0–34.0)
MCHC: 32.4 g/dL (ref 30.0–36.0)
MCV: 105.4 fL — ABNORMAL HIGH (ref 80.0–100.0)
Monocytes Absolute: 0.5 K/uL (ref 0.1–1.0)
Monocytes Relative: 12 %
Neutro Abs: 2.4 K/uL (ref 1.7–7.7)
Neutrophils Relative %: 63 %
Platelets: 184 K/uL (ref 150–400)
RBC: 3.13 MIL/uL — ABNORMAL LOW (ref 3.87–5.11)
RDW: 15.6 % — ABNORMAL HIGH (ref 11.5–15.5)
WBC: 3.8 K/uL — ABNORMAL LOW (ref 4.0–10.5)
nRBC: 0 % (ref 0.0–0.2)

## 2024-03-07 LAB — COMPREHENSIVE METABOLIC PANEL WITH GFR
ALT: 9 U/L (ref 0–44)
AST: 20 U/L (ref 15–41)
Albumin: 4.4 g/dL (ref 3.5–5.0)
Alkaline Phosphatase: 80 U/L (ref 38–126)
Anion gap: 18 — ABNORMAL HIGH (ref 5–15)
BUN: 24 mg/dL — ABNORMAL HIGH (ref 8–23)
CO2: 25 mmol/L (ref 22–32)
Calcium: 9.7 mg/dL (ref 8.9–10.3)
Chloride: 91 mmol/L — ABNORMAL LOW (ref 98–111)
Creatinine, Ser: 5.67 mg/dL — ABNORMAL HIGH (ref 0.44–1.00)
GFR, Estimated: 7 mL/min — ABNORMAL LOW
Glucose, Bld: 124 mg/dL — ABNORMAL HIGH (ref 70–99)
Potassium: 4 mmol/L (ref 3.5–5.1)
Sodium: 133 mmol/L — ABNORMAL LOW (ref 135–145)
Total Bilirubin: 0.3 mg/dL (ref 0.0–1.2)
Total Protein: 7.1 g/dL (ref 6.5–8.1)

## 2024-03-07 LAB — BASIC METABOLIC PANEL WITH GFR
Anion gap: 10 (ref 5–15)
BUN: 21 mg/dL (ref 8–23)
CO2: 32 mmol/L (ref 22–32)
Calcium: 9.2 mg/dL (ref 8.9–10.3)
Chloride: 93 mmol/L — ABNORMAL LOW (ref 98–111)
Creatinine, Ser: 5.33 mg/dL — ABNORMAL HIGH (ref 0.44–1.00)
GFR, Estimated: 7 mL/min — ABNORMAL LOW
Glucose, Bld: 64 mg/dL — ABNORMAL LOW (ref 70–99)
Potassium: 3.6 mmol/L (ref 3.5–5.1)
Sodium: 134 mmol/L — ABNORMAL LOW (ref 135–145)

## 2024-03-07 LAB — MAGNESIUM: Magnesium: 2.6 mg/dL — ABNORMAL HIGH (ref 1.7–2.4)

## 2024-03-07 MED ORDER — BORTEZOMIB CHEMO SQ INJECTION 3.5 MG (2.5MG/ML)
1.2000 mg/m2 | Freq: Once | INTRAMUSCULAR | Status: AC
  Administered 2024-03-07: 2 mg via SUBCUTANEOUS
  Filled 2024-03-07: qty 0.8

## 2024-03-07 MED ORDER — PROCHLORPERAZINE MALEATE 10 MG PO TABS
10.0000 mg | ORAL_TABLET | Freq: Once | ORAL | Status: AC
  Administered 2024-03-07: 10 mg via ORAL
  Filled 2024-03-07: qty 1

## 2024-03-07 NOTE — Patient Instructions (Signed)
 [facility] [place] - [facility]  Discharge Instructions: Thank you for choosing [facility] to provide your oncology and hematology care.  If you have a lab appointment with the Cancer Center - please note that after April 8th, 2024, all labs will be drawn in the cancer center.  You do not have to check in or register with the main entrance as you have in the past but will complete your check-in in the cancer center.  Wear comfortable clothing and clothing appropriate for easy access to any Portacath or PICC line.   We strive to give you quality time with your provider. You may need to reschedule your appointment if you arrive late (15 or more minutes).  Arriving late affects you and other patients whose appointments are after yours.  Also, if you miss three or more appointments without notifying the office, you may be dismissed from the clinic at the provider's discretion.      For prescription refill requests, have your pharmacy contact our office and allow 72 hours for refills to be completed.    Today you received the following chemotherapy and/or immunotherapy agents Velcade      To help prevent nausea and vomiting after your treatment, we encourage you to take your nausea medication as directed.  BELOW ARE SYMPTOMS THAT SHOULD BE REPORTED IMMEDIATELY: *FEVER GREATER THAN 100.4 F (38 C) OR HIGHER *CHILLS OR SWEATING *NAUSEA AND VOMITING THAT IS NOT CONTROLLED WITH YOUR NAUSEA MEDICATION *UNUSUAL SHORTNESS OF BREATH *UNUSUAL BRUISING OR BLEEDING *URINARY PROBLEMS (pain or burning when urinating, or frequent urination) *BOWEL PROBLEMS (unusual diarrhea, constipation, pain near the anus) TENDERNESS IN MOUTH AND THROAT WITH OR WITHOUT PRESENCE OF ULCERS (sore throat, sores in mouth, or a toothache) UNUSUAL RASH, SWELLING OR PAIN  UNUSUAL VAGINAL DISCHARGE OR ITCHING   Items with * indicate a potential emergency and should be followed up  as soon as possible or go to the Emergency Department if any problems should occur.  Please show the CHEMOTHERAPY ALERT CARD or IMMUNOTHERAPY ALERT CARD at check-in to the Emergency Department and triage nurse.  Should you have questions after your visit or need to cancel or reschedule your appointment, please contact [facility] [place] - A DEPT OF [facility] [phone]  and follow the prompts.  Office hours are 8:00 a.m. to 4:30 p.m. Monday - Friday. Please note that voicemails left after 4:00 p.m. may not be returned until the following business day.  We are closed weekends and major holidays. You have access to a nurse at all times for urgent questions. Please call the main number to the clinic [phone] and follow the prompts.  For any non-urgent questions, you may also contact your provider using MyChart. We now offer e-Visits for anyone 44 and older to request care online for non-urgent symptoms. For details visit [email or web address].   Also download the MyChart app! Go to the app store, search "MyChart", open the app, select [place], and log in with your MyChart username and password.

## 2024-03-07 NOTE — Progress Notes (Signed)
Patient presents today for Velcade injection per providers order.  Vital signs and labs within parameters for treatment.  Stable during administration without incident; injection site WNL; see MAR for injection details.  Patient tolerated procedure well and without incident.  No questions or complaints noted at this time.

## 2024-03-08 LAB — PROTEIN ELECTROPHORESIS, SERUM
A/G Ratio: 1.4 (ref 0.7–1.7)
Albumin ELP: 3.7 g/dL (ref 2.9–4.4)
Alpha-1-Globulin: 0.2 g/dL (ref 0.0–0.4)
Alpha-2-Globulin: 0.7 g/dL (ref 0.4–1.0)
Beta Globulin: 1.1 g/dL (ref 0.7–1.3)
Gamma Globulin: 0.6 g/dL (ref 0.4–1.8)
Globulin, Total: 2.7 g/dL (ref 2.2–3.9)
M-Spike, %: 0.3 g/dL — ABNORMAL HIGH
Total Protein ELP: 6.4 g/dL (ref 6.0–8.5)

## 2024-03-14 ENCOUNTER — Inpatient Hospital Stay

## 2024-03-14 ENCOUNTER — Inpatient Hospital Stay: Attending: Hematology

## 2024-03-16 ENCOUNTER — Inpatient Hospital Stay (HOSPITAL_BASED_OUTPATIENT_CLINIC_OR_DEPARTMENT_OTHER): Admitting: Oncology

## 2024-03-16 ENCOUNTER — Inpatient Hospital Stay

## 2024-03-16 ENCOUNTER — Inpatient Hospital Stay: Attending: Oncology

## 2024-03-16 VITALS — BP 125/59 | HR 97 | Temp 98.5°F | Resp 18 | Wt 138.0 lb

## 2024-03-16 DIAGNOSIS — Z5112 Encounter for antineoplastic immunotherapy: Secondary | ICD-10-CM | POA: Diagnosis present

## 2024-03-16 DIAGNOSIS — C9000 Multiple myeloma not having achieved remission: Secondary | ICD-10-CM | POA: Diagnosis not present

## 2024-03-16 DIAGNOSIS — Z853 Personal history of malignant neoplasm of breast: Secondary | ICD-10-CM

## 2024-03-16 DIAGNOSIS — D649 Anemia, unspecified: Secondary | ICD-10-CM | POA: Diagnosis not present

## 2024-03-16 DIAGNOSIS — N186 End stage renal disease: Secondary | ICD-10-CM | POA: Diagnosis not present

## 2024-03-16 LAB — COMPREHENSIVE METABOLIC PANEL WITH GFR
ALT: 11 U/L (ref 0–44)
AST: 21 U/L (ref 15–41)
Albumin: 4.4 g/dL (ref 3.5–5.0)
Alkaline Phosphatase: 70 U/L (ref 38–126)
Anion gap: 17 — ABNORMAL HIGH (ref 5–15)
BUN: 19 mg/dL (ref 8–23)
CO2: 25 mmol/L (ref 22–32)
Calcium: 10 mg/dL (ref 8.9–10.3)
Chloride: 95 mmol/L — ABNORMAL LOW (ref 98–111)
Creatinine, Ser: 4.89 mg/dL — ABNORMAL HIGH (ref 0.44–1.00)
GFR, Estimated: 8 mL/min — ABNORMAL LOW
Glucose, Bld: 131 mg/dL — ABNORMAL HIGH (ref 70–99)
Potassium: 4 mmol/L (ref 3.5–5.1)
Sodium: 137 mmol/L (ref 135–145)
Total Bilirubin: 0.3 mg/dL (ref 0.0–1.2)
Total Protein: 7.2 g/dL (ref 6.5–8.1)

## 2024-03-16 LAB — CBC WITH DIFFERENTIAL/PLATELET
Abs Immature Granulocytes: 0.01 K/uL (ref 0.00–0.07)
Basophils Absolute: 0 K/uL (ref 0.0–0.1)
Basophils Relative: 1 %
Eosinophils Absolute: 0.2 K/uL (ref 0.0–0.5)
Eosinophils Relative: 5 %
HCT: 31.8 % — ABNORMAL LOW (ref 36.0–46.0)
Hemoglobin: 10.1 g/dL — ABNORMAL LOW (ref 12.0–15.0)
Immature Granulocytes: 0 %
Lymphocytes Relative: 22 %
Lymphs Abs: 0.8 K/uL (ref 0.7–4.0)
MCH: 33.9 pg (ref 26.0–34.0)
MCHC: 31.8 g/dL (ref 30.0–36.0)
MCV: 106.7 fL — ABNORMAL HIGH (ref 80.0–100.0)
Monocytes Absolute: 0.6 K/uL (ref 0.1–1.0)
Monocytes Relative: 15 %
Neutro Abs: 2.1 K/uL (ref 1.7–7.7)
Neutrophils Relative %: 57 %
Platelets: 179 K/uL (ref 150–400)
RBC: 2.98 MIL/uL — ABNORMAL LOW (ref 3.87–5.11)
RDW: 15.4 % (ref 11.5–15.5)
WBC: 3.7 K/uL — ABNORMAL LOW (ref 4.0–10.5)
nRBC: 0 % (ref 0.0–0.2)

## 2024-03-16 LAB — MAGNESIUM: Magnesium: 2.5 mg/dL — ABNORMAL HIGH (ref 1.7–2.4)

## 2024-03-16 MED ORDER — BORTEZOMIB CHEMO SQ INJECTION 3.5 MG (2.5MG/ML)
1.2000 mg/m2 | Freq: Once | INTRAMUSCULAR | Status: AC
  Administered 2024-03-16: 2 mg via SUBCUTANEOUS
  Filled 2024-03-16: qty 0.8

## 2024-03-16 MED ORDER — PROCHLORPERAZINE MALEATE 10 MG PO TABS
10.0000 mg | ORAL_TABLET | Freq: Once | ORAL | Status: AC
  Administered 2024-03-16: 10 mg via ORAL
  Filled 2024-03-16: qty 1

## 2024-03-16 NOTE — Progress Notes (Signed)
 " Patient Care Team: Carly Barnie RAMAN, NP as PCP - General (Geriatric Medicine) Carly Vina GAILS, MD as PCP - Cardiology (Cardiology) Carly [name], MD as Consulting Physician (Gastroenterology) Carly Paula, MD (Inactive) as Consulting Physician (Nephrology) [facility]  Clinic Day:  03/16/2024  Referring physician: [name], NP   CHIEF COMPLAINT:  CC: IgA lambda plasma cell myeloma, stage II, standard risk    ASSESSMENT & PLAN:   Assessment & Plan: Carly Wood  is a 89 y.o. female with IgA lambda plasma cell myeloma  Assessment and Plan  Multiple myeloma not having achieved remission R-ISS-stage II IgA lambda plasma cell myeloma Patient has always been on Velcade  and dexamethasone .  Revlimid was not started because of poor renal function Multiple myeloma labs slowly trending up.   - Reviewed labs from today: CMP: Creatinine: 4.89, magnesium: 2.5, normal LFTs.  CBC: WBC: 3.7, globin 10.1, platelets: 179 -Multiple myeloma labs reviewed from 03/07/2024: Kappa free light chain: 162.6, lambda free light chain: 557.9, ratio: 0.29.  SPEP: M spike: 0.3, IFE: Faint band in the beta region suspicious for monoclonal immunoglobulin. - Continue Velcade  weekly.  Physical exam stable today.  Okay to proceed with treatment today. - If disease continues to progress, will discuss goals of care with the patient.  If patient wishes to continue treatment, can consider single agent daratumumab monthly along with Velcade .   Return to clinic in 12 weeks with labs  End-stage renal disease Continue hemodialysis Monday Wednesday Friday  Normocytic anemia Likely secondary to multiple myeloma, ESRD, iron deficiency   - Obtain iron panel with next blood draw.  And replace as needed.  Left finger paresthesia Numbness and tingling in the left finger without significant discomfort or functional impairment.  History of right breast cancer Oncology  history below.  S/p mastectomy. ER/PR positive, HER2 negative, pT1c pNX.  S/p adjuvant chemotherapy. Currently on anastrozole .  Completed 9 years.  -Tolerating anastrozole  well. - Can discontinue anastrozole .  The patient understands the plans discussed today and is in agreement with them.  She knows to contact our office if she develops concerns prior to her next appointment.  The total time spent in the appointment was 30 minutes for the encounter with patient, including review of chart and various tests results, discussions about plan of care and coordination of care plan   I,Helena R Teague,acting as a scribe for [name], MD.,have documented all relevant documentation on the behalf of [name], MD,as directed by  [name], MD while in the presence of [name], MD.  I, [name] MD, have reviewed the above documentation for accuracy and completeness, and I agree with the above.    [name], MD  [facility] - A DEPT OF [name] [address] [place] [address] Dept: [phone] Dept Fax: [phone]   Orders Placed This Encounter  Procedures   Kappa/lambda light chains    Standing Status:   Future    Expected Date:   06/08/2024    Expiration Date:   09/06/2024   Protein electrophoresis, serum    Standing Status:   Future    Expected Date:   06/08/2024    Expiration Date:   09/06/2024   Ferritin    Standing Status:   Future    Expected Date:   03/23/2024    Expiration Date:   06/21/2024   Folate    Standing  Status:   Future    Expected Date:   03/23/2024    Expiration Date:   06/21/2024   Vitamin B12    Standing Status:   Future    Expected Date:   03/23/2024    Expiration Date:   06/21/2024   Iron and TIBC    Standing Status:   Future    Expected Date:   03/23/2024    Expiration Date:   06/21/2024     ONCOLOGY HISTORY:   I have reviewed her chart and materials  related to her cancer extensively and collaborated history with the patient. Summary of oncologic history is as follows:   IgA lambda plasma cell myeloma, stage II, standard risk   -05/13/2016: Initial Myeloma labs. M spike 0.7 g. IgA 1,257. FLC ratio 4.99 with elevated lambda light chains at 339.9 and elevated kappa light chains at 68.1.  Patient refused bone marrow biopsy. -08/29/2018: Myeloma labs at diagnosis. M spike 0.9 g.  Kappa light chains 148, lambda light chains 1132, ratio 7.64.  LDH normal.  Beta-2  microglobulin 13.5.  -09/20/2018: Bone Marrow Biopsy: Pathology: Plasma cell myeloma. The marrow is normocellular for age but with increased monoclonal plasma cells (15% aspirate, 30% CD138 immunohistochemistry). By light chain in situ hybridization the plasma cells are lambda restricted. There are scattered lymphoid aggregates which consist of predominately T-cells (CD3, CD5) and scattered B-cells (CD20). CD10, bcl-6, and cyclinD1 are negative. There is a mild erythroid hyperplasia. Megakaryocytes exhibit a spectrum of maturation without tight clusters.  -Flow Cytometry: No monoclonal B-cell or phenotypically aberrant T-cell population identified.   -Normal Female Karyotype  -Myeloma FISH Panel: Normal -10/04/2018: Bone Survey: No aggressive lytic or sclerotic osseous lesion.  -10/11/2018-current: Velcade  and dexamethasone . Revlimid not added to regimen due to impaired renal function.     Diagnosis: Stage I IDC of the right breast, ER/PR positive, HER-2 negative   -02/16/2014: Abnormal mammogram with mass noted in right breast. Subsequent US  showed hypoechoic mass, which was biopsied and found to be benign.  -09/12/2014: Diagnostic Mammogram and US  of right breast: Spiculated mass in upper outer right breast appears slightly increased in size from prior exam.  -10/02/2014: Right breast biopsy with additional right breast excision.  Biopsy Pathology: Invasive ductal carcinoma,high grade  solid type. 1.1 cm tumor size involving lateral resection margin.  -DCIS, Solid type -Excision Pathology: Invasive high-grade ductal carcinoma, 0.9 cm DCIS, cribriform and solid types. 0.9 cm tumor size involving the anterior and deep resection margins.  Comment: The tumor cells are ER positive (90%), PR positive (30%), and Her2 negative (2+). 2 different invasive tumor cells were counted during Her2/NEU Gene amplification. pT1C pNx Mx -10/24/2014: Right mastectomy with sentinel lymph node excision.  Pathology: Focal DCIS with negative margins. Zero of seven lymph nodes identified positive for metastatic carcinoma (0/7). pT1cpN0 -12/28/2014-11/22/2015: Taxol/herceptin  weekly X 12, Herceptin  every 21 days for 52 weeks -03/11/2015-current: Anastrozole  -06/08/2016: Neratinib  for 1 year  Current Treatment:  Weekly Velcade  for multiple myeloma and anastrozole  for breast cancer  INTERVAL HISTORY:   Carly Wood is here today for follow-up of plasma cell myeloma and right breast IDC.   Carly Wood states her left wrist is sore for the past week. She is receiving weekly Velcade  and taking Anastrozole  as prescribed.   I have reviewed the past medical history, past surgical history, social history and family history with the patient and they are unchanged from previous note.  ALLERGIES:  is allergic to ace inhibitors, angiotensin receptor blockers, and penicillins.  MEDICATIONS:  Current Outpatient Medications  Medication Sig Dispense Refill   methylPREDNISolone  (MEDROL ) 4 MG tablet See Instructions, 0 Refill(s)     predniSONE  (DELTASONE ) 10 MG tablet Take 10 mg by mouth daily.     acetaminophen  (TYLENOL ) 325 MG tablet Take 650 mg by mouth 3 (three) times daily.     acyclovir  (ZOVIRAX ) 200 MG capsule Take 200 mg by mouth in the morning. (0800)     aspirin  EC 81 MG tablet Take 1 tablet (81 mg total) by mouth daily. Swallow whole.     atorvastatin  (LIPITOR) 40 MG tablet Take 1 tablet (40 mg total) by  mouth daily. (Patient taking differently: Take 40 mg by mouth every evening.)     Balsam [place]-Castor Oil (VENELEX) OINT Apply topically as directed. Special Instructions: Apply to bilateral buttocks, sacrum, coccyx qshift for prevention. Every Shift Day, Evening, Night     Calcium  Carb-Cholecalciferol  (CALCIUM  + VITAMIN D3) 500-5 MG-MCG TABS Take 1 tablet by mouth daily.     melatonin 5 MG TABS Take 5 mg by mouth at bedtime.     methylPREDNISolone  (MEDROL  DOSEPAK) 4 MG TBPK tablet Medrol  Dosepak take as instructed 21 tablet 0   multivitamin (RENA-VIT) TABS tablet Take 1 tablet by mouth at bedtime.     ondansetron  (ZOFRAN -ODT) 4 MG disintegrating tablet Take 4 mg by mouth daily before breakfast.     pantoprazole  (PROTONIX ) 40 MG tablet Take 40 mg by mouth 2 (two) times daily.     sennosides-docusate sodium  (SENOKOT-S) 8.6-50 MG tablet Take 1 tablet by mouth 2 (two) times daily.     sevelamer  carbonate (RENVELA ) 800 MG tablet Take 800 mg by mouth daily. (Patient taking differently: Take 800 mg by mouth 3 (three) times daily with meals.)     sucralfate  (CARAFATE ) 1 GM/10ML suspension Take 10 mLs by mouth 2 (two) times daily.     No current facility-administered medications for this visit.   Facility-Administered Medications Ordered in Other Visits  Medication Dose Route Frequency Provider Last Rate Last Admin   lanreotide acetate  (SOMATULINE DEPOT ) 120 MG/0.5ML injection            lanreotide acetate  (SOMATULINE DEPOT ) 120 MG/0.5ML injection            lanreotide acetate  (SOMATULINE DEPOT ) 120 MG/0.5ML injection            lanreotide acetate  (SOMATULINE DEPOT ) 120 MG/0.5ML injection            octreotide  (SANDOSTATIN  LAR) 30 MG IM injection            octreotide  (SANDOSTATIN  LAR) 30 MG IM injection             VITALS:  Blood pressure (!) 125/59, pulse 97, temperature 98.5 F (36.9 C), temperature source Tympanic, resp. rate 18, weight 138 lb (62.6 kg), SpO2 100%.  Wt Readings from Last 3  Encounters:  03/16/24 138 lb (62.6 kg)  02/28/24 138 lb 3.2 oz (62.7 kg)  02/01/24 141 lb (64 kg)    Body mass index is 25.24 kg/m.  Performance status (ECOG): 3 - Symptomatic, >50% confined to bed  PHYSICAL EXAM:   GENERAL:alert, no distress and comfortable, in a wheelchair SKIN: skin color, texture, turgor are normal, no rashes or significant lesions LYMPH:  no palpable lymphadenopathy in the cervical, axillary or inguinal LUNGS: clear to auscultation and percussion with normal breathing effort HEART: regular rate & rhythm and no murmurs and no lower extremity edema ABDOMEN:abdomen soft, non-tender and normal bowel sounds Musculoskeletal:no  cyanosis of digits and no clubbing  NEURO: alert & oriented x 3 with fluent speech  LABORATORY DATA:  I have reviewed the data as listed   Lab Results  Component Value Date   WBC 3.7 (L) 03/16/2024   NEUTROABS 2.1 03/16/2024   HGB 10.1 (L) 03/16/2024   HCT 31.8 (L) 03/16/2024   MCV 106.7 (H) 03/16/2024   PLT 179 03/16/2024     Chemistry      Component Value Date/Time   NA 137 03/16/2024 1224   K 4.0 03/16/2024 1224   CL 95 (L) 03/16/2024 1224   CO2 25 03/16/2024 1224   BUN 19 03/16/2024 1224   CREATININE 4.89 (H) 03/16/2024 1224      Component Value Date/Time   CALCIUM  10.0 03/16/2024 1224   CALCIUM  9.0 10/18/2018 1059   ALKPHOS 70 03/16/2024 1224   AST 21 03/16/2024 1224   ALT 11 03/16/2024 1224   BILITOT 0.3 03/16/2024 1224       Latest Reference Range & Units 03/07/24 12:44  Total Protein ELP 6.0 - 8.5 g/dL 6.4  Albumin  ELP 2.9 - 4.4 g/dL 3.7  Globulin, Total 2.2 - 3.9 g/dL 2.7 (C)  A/G Ratio 0.7 - 1.7  1.4 (C)  Alpha-1-Globulin 0.0 - 0.4 g/dL 0.2  Joeyj-7-Honalopw 0.4 - 1.0 g/dL 0.7  Beta Globulin 0.7 - 1.3 g/dL 1.1  Gamma Globulin 0.4 - 1.8 g/dL 0.6  M-SPIKE, % Not Observed g/dL 0.3 (H)  SPE Interp.  Comment  Comment  Comment  (H): Data is abnormally high (C): Corrected   Latest Reference Range & Units  02/22/24 10:51  Kappa free light chain 3.3 - 19.4 mg/L 160.6 (H)  Lambda free light chains 5.7 - 26.3 mg/L 557.9 (H)  Kappa, lambda light chain ratio 0.26 - 1.65  0.29  (H): Data is abnormally high  RADIOGRAPHIC STUDIES: I have personally reviewed the radiological images as listed and agreed with the findings in the report.  None new to review   "

## 2024-03-16 NOTE — Patient Instructions (Signed)
 [facility] at [facility] Discharge Instructions   You were seen and examined today by Dr. Davonna.  She reviewed the results of your lab work which are normal/stable.   We will proceed with your treatment today.   STOP taking anastrozole .   Return as scheduled.    Thank you for choosing [facility] at [facility] to provide your oncology and hematology care.  To afford each patient quality time with our provider, please arrive at least 15 minutes before your scheduled appointment time.   If you have a lab appointment with the Cancer Center please come in thru the Main Entrance and check in at the main information desk.  You need to re-schedule your appointment should you arrive 10 or more minutes late.  We strive to give you quality time with our providers, and arriving late affects you and other patients whose appointments are after yours.  Also, if you no show three or more times for appointments you may be dismissed from the clinic at the providers discretion.     Again, thank you for choosing [facility].  Our hope is that these requests will decrease the amount of time that you wait before being seen by our physicians.       _____________________________________________________________  Should you have questions after your visit to [facility], please contact our office at [phone] and follow the prompts.  Our office hours are 8:00 a.m. and 4:30 p.m. Monday - Friday.  Please note that voicemails left after 4:00 p.m. may not be returned until the following business day.  We are closed weekends and major holidays.  You do have access to a nurse 24-7, just call the main number to the clinic [phone] and do not press any options, hold on the line and a nurse will answer the phone.    For prescription refill requests, have your pharmacy contact our office and allow 72 hours.    Due to Covid, you will need to  wear a mask upon entering the hospital. If you do not have a mask, a mask will be given to you at the Main Entrance upon arrival. For doctor visits, patients may have 1 support person age 43 or older with them. For treatment visits, patients can not have anyone with them due to social distancing guidelines and our immunocompromised population.

## 2024-03-16 NOTE — Patient Instructions (Signed)
 [facility] [place] - [facility]  Discharge Instructions: Thank you for choosing [facility] to provide your oncology and hematology care.  If you have a lab appointment with the Cancer Center - please note that after April 8th, 2024, all labs will be drawn in the cancer center.  You do not have to check in or register with the main entrance as you have in the past but will complete your check-in in the cancer center.  Wear comfortable clothing and clothing appropriate for easy access to any Portacath or PICC line.   We strive to give you quality time with your provider. You may need to reschedule your appointment if you arrive late (15 or more minutes).  Arriving late affects you and other patients whose appointments are after yours.  Also, if you miss three or more appointments without notifying the office, you may be dismissed from the clinic at the provider's discretion.      For prescription refill requests, have your pharmacy contact our office and allow 72 hours for refills to be completed.    Today you received the following chemotherapy and/or immunotherapy agents Velcade      To help prevent nausea and vomiting after your treatment, we encourage you to take your nausea medication as directed.  BELOW ARE SYMPTOMS THAT SHOULD BE REPORTED IMMEDIATELY: *FEVER GREATER THAN 100.4 F (38 C) OR HIGHER *CHILLS OR SWEATING *NAUSEA AND VOMITING THAT IS NOT CONTROLLED WITH YOUR NAUSEA MEDICATION *UNUSUAL SHORTNESS OF BREATH *UNUSUAL BRUISING OR BLEEDING *URINARY PROBLEMS (pain or burning when urinating, or frequent urination) *BOWEL PROBLEMS (unusual diarrhea, constipation, pain near the anus) TENDERNESS IN MOUTH AND THROAT WITH OR WITHOUT PRESENCE OF ULCERS (sore throat, sores in mouth, or a toothache) UNUSUAL RASH, SWELLING OR PAIN  UNUSUAL VAGINAL DISCHARGE OR ITCHING   Items with * indicate a potential emergency and should be followed up  as soon as possible or go to the Emergency Department if any problems should occur.  Please show the CHEMOTHERAPY ALERT CARD or IMMUNOTHERAPY ALERT CARD at check-in to the Emergency Department and triage nurse.  Should you have questions after your visit or need to cancel or reschedule your appointment, please contact [facility] [place] - A DEPT OF [facility] [phone]  and follow the prompts.  Office hours are 8:00 a.m. to 4:30 p.m. Monday - Friday. Please note that voicemails left after 4:00 p.m. may not be returned until the following business day.  We are closed weekends and major holidays. You have access to a nurse at all times for urgent questions. Please call the main number to the clinic [phone] and follow the prompts.  For any non-urgent questions, you may also contact your provider using MyChart. We now offer e-Visits for anyone 44 and older to request care online for non-urgent symptoms. For details visit [email or web address].   Also download the MyChart app! Go to the app store, search "MyChart", open the app, select [place], and log in with your MyChart username and password.

## 2024-03-16 NOTE — Progress Notes (Signed)
 Patient has been examined by Dr. Davonna. Vital signs and labs have been reviewed by MD - ANC, Creatinine, LFTs, hemoglobin, and platelets have been reviewed by M.D. - pt may proceed with treatment.  Primary RN and pharmacy notified.

## 2024-03-16 NOTE — Progress Notes (Signed)
 Patient presents today for chemotherapy Velcade  injection. Patient is in satisfactory condition with no new complaints voiced.  Vital signs are stable.  Labs reviewed by Dr. Davonna during the office visit and all labs are within treatment parameters.  We will proceed with treatment per MD orders.   Treatment given today per MD orders. Tolerated infusion without adverse affects. Vital signs stable. No complaints at this time. Discharged from clinic via wheelchair in stable condition. Alert and oriented x 3. F/U with [facility] as scheduled.

## 2024-03-17 ENCOUNTER — Other Ambulatory Visit: Payer: Self-pay

## 2024-03-17 ENCOUNTER — Non-Acute Institutional Stay (SKILLED_NURSING_FACILITY): Payer: Self-pay | Admitting: Adult Health

## 2024-03-17 ENCOUNTER — Encounter: Payer: Self-pay | Admitting: Adult Health

## 2024-03-17 DIAGNOSIS — I120 Hypertensive chronic kidney disease with stage 5 chronic kidney disease or end stage renal disease: Secondary | ICD-10-CM | POA: Diagnosis not present

## 2024-03-17 DIAGNOSIS — Z992 Dependence on renal dialysis: Secondary | ICD-10-CM | POA: Diagnosis not present

## 2024-03-17 DIAGNOSIS — E1122 Type 2 diabetes mellitus with diabetic chronic kidney disease: Secondary | ICD-10-CM

## 2024-03-17 DIAGNOSIS — I5032 Chronic diastolic (congestive) heart failure: Secondary | ICD-10-CM

## 2024-03-17 DIAGNOSIS — N186 End stage renal disease: Secondary | ICD-10-CM | POA: Diagnosis not present

## 2024-03-17 NOTE — Progress Notes (Signed)
 " Location:  [facility] Room Number: [address] Place of Service:  SNF (31)   CODE STATUS: DNR  Allergies[1]  Chief Complaint  Patient presents with   care plan meeting    HPI:  We have come together for her care plan meeting. Family present. BIMS 11/15 mood 3/30: little energy. Uses wheelchair without falls. She requires moderate to dependent assist with her adl care. She is frequently incontinent of bladder and bowel. Dietary: setup for meals; D3 diet with 1200 cc fluid restriction. Appetite 51-100% weight is 138 pounds. Therapy: OT for eating: will need divided plate. Activities: does participate. She will continue to be followed for her chronic illnesses including:   Chronic diastolic congestive heart failure   Hypertension associated with end stage renal disease due to type 2 diabetes mellitus   ESRD with hemodialysis   Past Medical History:  Diagnosis Date   Anemia     chronic macrocytic anemia   Anxiety    Chronic kidney disease    Chronic renal disease, stage 4, severely decreased glomerular filtration rate (GFR) between 15-29 mL/min/1.73 square meter (HCC) 08/22/2015   Complication of anesthesia    delirious after Breast Surgery   Dementia (HCC)    mild   Depression    Diabetes mellitus with ESRD (end-stage renal disease) (HCC)    type II   Dysphagia    Dyspnea    with activity   GERD (gastroesophageal reflux disease)    Glaucoma    Hyperlipidemia    Hypertension    Multiple myeloma (HCC)    Pneumonia    Stage 1 infiltrating ductal carcinoma of right female breast (HCC) 08/21/2015   ER+ PR+ HER 2 neu + (3+) T1cN0     Past Surgical History:  Procedure Laterality Date   A/V FISTULAGRAM Right 07/05/2020   Procedure: A/V Fistulagram;  Surgeon: Harvey Carlin BRAVO, MD;  [facility];  Service: Cardiovascular;  Laterality: Right;   AV FISTULA PLACEMENT Left 11/22/2017   Procedure: ARTERIOVENOUS (AV) FISTULA CREATION LEFT ARM;   Surgeon: Harvey Carlin BRAVO, MD;  Location: [facility] OR;  Service: Vascular;  Laterality: Left;   AV FISTULA PLACEMENT Right 04/04/2020   Procedure: RIGHT ARM ARTERIOVENOUS FISTULA CREATION;  Surgeon: Oris Krystal FALCON, MD;  Location: [facility] ORS;  Service: Vascular;  Laterality: Right;   AV FISTULA PLACEMENT Right 08/20/2020   Procedure: ARTERIOVENOUS (AV) FISTULA LIGATION RIGHT ARM;  Surgeon: Harvey Carlin BRAVO, MD;  Location: [facility] OR;  Service: Vascular;  Laterality: Right;   BIOPSY  08/07/2016   Procedure: BIOPSY;  Surgeon: Shaaron [name], MD;  Location: [facility];  Service: Endoscopy;;  gastric ulcer biopsy   COLONOSCOPY     ESOPHAGOGASTRODUODENOSCOPY N/A 08/07/2016   LA Grade A esophagitis s/p dilation, small hiatal hernia, multiple gastric ulcers and erosions, duodenal erosions s/p biopsy. Negative H.pylori    ESOPHAGOGASTRODUODENOSCOPY N/A 11/27/2016   normal esophagus, previously noted gastric ulcers completely healed, normal duodenum.    ESOPHAGOGASTRODUODENOSCOPY (EGD) WITH PROPOFOL  N/A 07/23/2020   Procedure: ESOPHAGOGASTRODUODENOSCOPY (EGD) WITH PROPOFOL ;  Surgeon: Golda Claudis PENNER, MD;  Location: [facility];  Service: Endoscopy;  Laterality: N/A;   FISTULA SUPERFICIALIZATION Left 02/14/2018   Procedure: FISTULA SUPERFICIALIZATION LEFT ARM;  Surgeon: Eliza Lonni RAMAN, MD;  Location: [facility] OR;  Service: Vascular;  Laterality: Left;   FRACTURE SURGERY Right    ankle   HOT HEMOSTASIS  07/23/2020   Procedure: HOT HEMOSTASIS (ARGON PLASMA COAGULATION/BICAP);  Surgeon: Golda Claudis  U, MD;  [facility]: [facility];  Service: Endoscopy;;   INTRAMEDULLARY (IM) NAIL INTERTROCHANTERIC Right 07/12/2020   Procedure: INTRAMEDULLARY (IM) NAIL INTERTROCHANTRIC;  Surgeon: Onesimo Oneil LABOR, MD;  Location: [facility] ORS;  Service: Orthopedics;  Laterality: Right;   IR FLUORO GUIDE CV LINE LEFT  12/21/2022   IR US  GUIDE VASC ACCESS LEFT  12/21/2022   MALONEY DILATION N/A 08/07/2016   Procedure: AGAPITO DILATION;   Surgeon: Shaaron [name], MD;  [facility]: [facility];  Service: Endoscopy;  Laterality: N/A;   MASTECTOMY, PARTIAL Right    PERIPHERAL VASCULAR BALLOON ANGIOPLASTY Left 07/13/2019   Procedure: PERIPHERAL VASCULAR BALLOON ANGIOPLASTY;  Surgeon: Gretta Lonni PARAS, MD;  [facility];  Service: Cardiovascular;  Laterality: Left;  arm fistulogram   PERIPHERAL VASCULAR BALLOON ANGIOPLASTY Right 05/22/2020   Procedure: PERIPHERAL VASCULAR BALLOON ANGIOPLASTY;  Surgeon: Magda Debby SAILOR, MD;  [facility]: [facility];  Service: Cardiovascular;  Laterality: Right;  arm fistula   PERIPHERAL VASCULAR BALLOON ANGIOPLASTY Right 07/05/2020   Procedure: PERIPHERAL VASCULAR BALLOON ANGIOPLASTY;  Surgeon: Harvey Carlin BRAVO, MD;  [facility]: [facility];  Service: Cardiovascular;  Laterality: Right;  arm fistula   PORT-A-CATH REMOVAL Left 11/22/2017   Procedure: REMOVAL PORT-A-CATH LEFT CHEST;  Surgeon: Harvey Carlin BRAVO, MD;  Location: [facility] OR;  Service: Vascular;  Laterality: Left;   RETINAL DETACHMENT SURGERY Right    SCLEROTHERAPY  07/23/2020   Procedure: SCLEROTHERAPY;  Surgeon: Golda Claudis PENNER, MD;  [facility];  Service: Endoscopy;;   TUNNELLED CATHETER EXCHANGE N/A 08/16/2023   Procedure: TUNNELLED CATHETER EXCHANGE;  Surgeon: Magda Debby SAILOR, MD;  Location: [facility];  Service: Cardiovascular;  Laterality: N/A;   UPPER EXTREMITY VENOGRAPHY N/A 08/22/2020   Procedure: LEFT UPPER & CENTRAL VENOGRAPHY;  Surgeon: Gretta Lonni PARAS, MD;  [facility]: [facility];  Service: Cardiovascular;  Laterality: N/A;    Social History   Socioeconomic History   Marital status: Single    Spouse name: Not on file   Number of children: Not on file   Years of education: Not on file   Highest education level: Not on file  Occupational History   Occupation: retired   Tobacco Use   Smoking status: Never   Smokeless tobacco: Never  Vaping Use   Vaping status: Never Used   Substance and Sexual Activity   Alcohol use: No    Alcohol/week: 0.0 standard drinks of alcohol   Drug use: No   Sexual activity: Never  Other Topics Concern   Not on file  Social History Narrative   Long term resident of SNF    Social Drivers of Health   Tobacco Use: Low Risk (03/17/2024)   Patient History    Smoking Tobacco Use: Never    Smokeless Tobacco Use: Never    Passive Exposure: Not on file  Financial Resource Strain: Not on file  Food Insecurity: Patient Unable To Answer (12/24/2023)   Epic    Worried About Programme Researcher, Broadcasting/film/video in the Last Year: Patient unable to answer    Ran Out of Food in the Last Year: Patient unable to answer  Transportation Needs: Patient Unable To Answer (12/24/2023)   Epic    Lack of Transportation (Medical): Patient unable to answer    Lack of Transportation (Non-Medical): Patient unable to answer  Physical Activity: Not on file  Stress: Not on file  Social Connections: Patient Unable To Answer (12/24/2023)   Social Connection and Isolation  Panel    Frequency of Communication with Friends and Family: Patient unable to answer    Frequency of Social Gatherings with Friends and Family: Patient unable to answer    Attends Religious Services: Patient unable to answer    Active Member of Clubs or Organizations: Patient unable to answer    Attends Banker Meetings: Patient unable to answer    Marital Status: Patient unable to answer  Intimate Partner Violence: Patient Unable To Answer (12/24/2023)   Epic    Fear of Current or Ex-Partner: Patient unable to answer    Emotionally Abused: Patient unable to answer    Physically Abused: Patient unable to answer    Sexually Abused: Patient unable to answer  Depression (PHQ2-9): Low Risk (03/16/2024)   Depression (PHQ2-9)    PHQ-2 Score: 0  Alcohol Screen: Not on file  Housing: Patient Unable To Answer (12/24/2023)   Epic    Unable to Pay for Housing in the Last Year: Patient unable to  answer    Number of Times Moved in the Last Year: Not on file    Homeless in the Last Year: Patient unable to answer  Utilities: Patient Unable To Answer (12/24/2023)   Epic    Threatened with loss of utilities: Patient unable to answer  Health Literacy: Not on file   Family History  Problem Relation Age of Onset   Multiple myeloma Sister    Brain cancer Sister    Dementia Mother        died at 56   Stroke Mother    Heart failure Mother    Diabetes Mother    Heart disease Father    Prostate cancer Brother    Colon cancer Neg Hx       VITAL SIGNS BP 111/60   Pulse 74   Temp (!) 97.2 F (36.2 C)   Ht 5' 2 (1.575 m)   Wt 141 lb 12.8 oz (64.3 kg)   SpO2 99%   BMI 25.94 kg/m   Outpatient Encounter Medications as of 03/17/2024  Medication Sig   acetaminophen  (TYLENOL ) 325 MG tablet Take 650 mg by mouth 3 (three) times daily.   acyclovir  (ZOVIRAX ) 200 MG capsule Take 200 mg by mouth in the morning. (0800)   aspirin  EC 81 MG tablet Take 1 tablet (81 mg total) by mouth daily. Swallow whole.   atorvastatin  (LIPITOR) 40 MG tablet Take 1 tablet (40 mg total) by mouth daily.   Balsam [place]-Castor Oil (VENELEX) OINT Apply topically as directed. Special Instructions: Apply to bilateral buttocks, sacrum, coccyx qshift for prevention. Every Shift Day, Evening, Night   Calcium  Carb-Cholecalciferol  (CALCIUM  + VITAMIN D3) 500-5 MG-MCG TABS Take 1 tablet by mouth daily.   melatonin 5 MG TABS Take 5 mg by mouth at bedtime.   multivitamin (RENA-VIT) TABS tablet Take 1 tablet by mouth at bedtime.   Nutritional Supplements (ENSURE ORIGINAL) LIQD Take 237 mLs by mouth daily.   ondansetron  (ZOFRAN -ODT) 4 MG disintegrating tablet Take 4 mg by mouth daily before breakfast.   pantoprazole  (PROTONIX ) 40 MG tablet Take 40 mg by mouth 2 (two) times daily.   sennosides-docusate sodium  (SENOKOT-S) 8.6-50 MG tablet Take 1 tablet by mouth 2 (two) times daily.   sevelamer  carbonate (RENVELA ) 800 MG tablet  Take 800 mg by mouth daily.   sucralfate  (CARAFATE ) 1 GM/10ML suspension Take 10 mLs by mouth 2 (two) times daily.   [DISCONTINUED] methylPREDNISolone  (MEDROL  DOSEPAK) 4 MG TBPK tablet Medrol  Dosepak take as instructed (  Patient not taking: Reported on 03/17/2024)   [DISCONTINUED] methylPREDNISolone  (MEDROL ) 4 MG tablet See Instructions, 0 Refill(s) (Patient not taking: Reported on 03/17/2024)   [DISCONTINUED] predniSONE  (DELTASONE ) 10 MG tablet Take 10 mg by mouth daily. (Patient not taking: Reported on 03/17/2024)   Facility-Administered Encounter Medications as of 03/17/2024  Medication   lanreotide acetate  (SOMATULINE DEPOT ) 120 MG/0.5ML injection   lanreotide acetate  (SOMATULINE DEPOT ) 120 MG/0.5ML injection   lanreotide acetate  (SOMATULINE DEPOT ) 120 MG/0.5ML injection   lanreotide acetate  (SOMATULINE DEPOT ) 120 MG/0.5ML injection   octreotide  (SANDOSTATIN  LAR) 30 MG IM injection   octreotide  (SANDOSTATIN  LAR) 30 MG IM injection     SIGNIFICANT DIAGNOSTIC EXAMS  LABS REVIEWED PREVIOUS      05-27-23: uric acid 3.4 07-01-23: wbc 4.1; hgb 11.2; hct 35.2; mcv 101.4 plt 197;glucose 145; bun 22; creat 6.07; k+ 3.8; na++ 135; ca 9.4 gfr 6 protein 7.2 albumin  3.6 mag 2.4  08-26-23: wbc 4.0; hgb 10.5; hct 32.8; mcv 103.5 plt 197; glucose 116; bun 24; creat 5.78; k+ 4.2; na++ 135; ca 9.5 gfr 7; protein 7.2 albumin  3.6 mag 2.3  11-11-23: wbc 3.9; hgb 9.8; hct 30.7; mcv 108.5 plt 166; glucose 170; bun 22; creat 5.47; k+ 4.0; na++ 136; ca 9.4 gfr 7 protein 7.0 albumin  3.6; mag 2.3  12-23-23: wbc 8.2; hgb 10.7; hct 33.7; mcv 107.7 plt 234; glucose 91; bun 18; creat 5.29; k+ 3.3; na++ 140; ca 10.5; gfr 7 protein 8.4 albumin  4.8; BNP >35,000 12-27-23: wbc 5.8; hgb 9.2; hct 28.5 mcv 106.3 plt 195; glucose 163; bun 52; creat 6.89; k+ 4.1; na++ 134; ca 9.5; gfr 5; phos 3.1 albumin  4.2   TODAY  01-06-24: wbc 5.1; hgb 10.5; hct 32.3; mcv 109.1 plt 204; glucose 117; bun 20; creat 4.77; k+ 3.8; na++ 136; ca 9.4 gfr 8  protein 6.9 albumin  4.2 mag 2.5 01-27-24: wbc 4.1; hgb 11.0; hct 34.2; mcv 107.9; plt 160; glucose 153; bun 24; creat 5.75; k+ 4.2 na++ 135; ca 9.8 gfr 7; protein 7.0 albumin  4.3 mag 2.6    Review of Systems  Constitutional:  Negative for malaise/fatigue.  Respiratory:  Negative for cough and shortness of breath.   Cardiovascular:  Negative for chest pain, palpitations and leg swelling.  Gastrointestinal:  Negative for abdominal pain, constipation and heartburn.  Musculoskeletal:  Negative for back pain, joint pain and myalgias.  Skin: Negative.   Neurological:  Negative for dizziness.  Psychiatric/Behavioral:  The patient is not nervous/anxious.     Physical Exam Constitutional:      General: She is not in acute distress.    Appearance: She is well-developed. She is not diaphoretic.  Neck:     Thyroid : No thyromegaly.  Cardiovascular:     Rate and Rhythm: Normal rate and regular rhythm.     Pulses: Normal pulses.     Heart sounds: Normal heart sounds.  Pulmonary:     Effort: Pulmonary effort is normal. No respiratory distress.     Breath sounds: Normal breath sounds.  Abdominal:     General: Bowel sounds are normal. There is no distension.     Palpations: Abdomen is soft.     Tenderness: There is no abdominal tenderness.  Musculoskeletal:        General: Normal range of motion.     Cervical back: Neck supple.     Right lower leg: No edema.     Left lower leg: No edema.  Lymphadenopathy:     Cervical: No cervical adenopathy.  Skin:  General: Skin is warm and dry.     Comments: Tunneled dialysis access   chest wall      Neurological:     Mental Status: She is alert. Mental status is at baseline.  Psychiatric:        Mood and Affect: Mood normal.      ASSESSMENT/ PLAN:  TODAY  Chronic diastolic congestive heart failure Hypertension associated with end stage renal disease due to type 2 diabetes mellitus ESRD with hemodialysis   Will continue current  medications Will continue current plan of care Will continue to monitor her status   Time spent with patient: 40 minutes: medications; therapy; dietary    Barnie Seip NP [facility]  call [phone]       [1]  Allergies Allergen Reactions   Ace Inhibitors Other (See Comments) and Cough    Tongue swelling Angioedema    Angiotensin Receptor Blockers Other (See Comments)    Angioedema with ACE-I   Penicillins Other (See Comments)    Unknown reaction   "

## 2024-03-23 ENCOUNTER — Inpatient Hospital Stay

## 2024-03-23 ENCOUNTER — Other Ambulatory Visit: Payer: Self-pay | Admitting: Oncology

## 2024-03-23 VITALS — BP 119/51 | HR 93 | Temp 97.2°F | Resp 20

## 2024-03-23 DIAGNOSIS — C9000 Multiple myeloma not having achieved remission: Secondary | ICD-10-CM

## 2024-03-23 DIAGNOSIS — Z5112 Encounter for antineoplastic immunotherapy: Secondary | ICD-10-CM | POA: Diagnosis not present

## 2024-03-23 LAB — COMPREHENSIVE METABOLIC PANEL WITH GFR
ALT: 10 U/L (ref 0–44)
AST: 21 U/L (ref 15–41)
Albumin: 4.1 g/dL (ref 3.5–5.0)
Alkaline Phosphatase: 71 U/L (ref 38–126)
Anion gap: 19 — ABNORMAL HIGH (ref 5–15)
BUN: 16 mg/dL (ref 8–23)
CO2: 24 mmol/L (ref 22–32)
Calcium: 9.7 mg/dL (ref 8.9–10.3)
Chloride: 93 mmol/L — ABNORMAL LOW (ref 98–111)
Creatinine, Ser: 4.59 mg/dL — ABNORMAL HIGH (ref 0.44–1.00)
GFR, Estimated: 9 mL/min — ABNORMAL LOW
Glucose, Bld: 175 mg/dL — ABNORMAL HIGH (ref 70–99)
Potassium: 3.9 mmol/L (ref 3.5–5.1)
Sodium: 135 mmol/L (ref 135–145)
Total Bilirubin: 0.4 mg/dL (ref 0.0–1.2)
Total Protein: 7 g/dL (ref 6.5–8.1)

## 2024-03-23 LAB — CBC WITH DIFFERENTIAL/PLATELET
Abs Immature Granulocytes: 0.01 K/uL (ref 0.00–0.07)
Basophils Absolute: 0 K/uL (ref 0.0–0.1)
Basophils Relative: 1 %
Eosinophils Absolute: 0.2 K/uL (ref 0.0–0.5)
Eosinophils Relative: 5 %
HCT: 32.2 % — ABNORMAL LOW (ref 36.0–46.0)
Hemoglobin: 10.4 g/dL — ABNORMAL LOW (ref 12.0–15.0)
Immature Granulocytes: 0 %
Lymphocytes Relative: 18 %
Lymphs Abs: 0.7 K/uL (ref 0.7–4.0)
MCH: 34.4 pg — ABNORMAL HIGH (ref 26.0–34.0)
MCHC: 32.3 g/dL (ref 30.0–36.0)
MCV: 106.6 fL — ABNORMAL HIGH (ref 80.0–100.0)
Monocytes Absolute: 0.5 K/uL (ref 0.1–1.0)
Monocytes Relative: 12 %
Neutro Abs: 2.5 K/uL (ref 1.7–7.7)
Neutrophils Relative %: 64 %
Platelets: 169 K/uL (ref 150–400)
RBC: 3.02 MIL/uL — ABNORMAL LOW (ref 3.87–5.11)
RDW: 15 % (ref 11.5–15.5)
WBC: 3.9 K/uL — ABNORMAL LOW (ref 4.0–10.5)
nRBC: 0 % (ref 0.0–0.2)

## 2024-03-23 LAB — MAGNESIUM: Magnesium: 2.3 mg/dL (ref 1.7–2.4)

## 2024-03-23 MED ORDER — BORTEZOMIB CHEMO SQ INJECTION 3.5 MG (2.5MG/ML)
1.2000 mg/m2 | Freq: Once | INTRAMUSCULAR | Status: AC
  Administered 2024-03-23: 2 mg via SUBCUTANEOUS
  Filled 2024-03-23: qty 0.8

## 2024-03-23 MED ORDER — PROCHLORPERAZINE MALEATE 10 MG PO TABS
10.0000 mg | ORAL_TABLET | Freq: Once | ORAL | Status: AC
  Administered 2024-03-23: 10 mg via ORAL
  Filled 2024-03-23: qty 1

## 2024-03-23 NOTE — Progress Notes (Signed)
 Patient tolerated Velcade injection with no complaints voiced.  Lab work reviewed.  See MAR for details.  Injection site clean and dry with no bruising or swelling noted.  Patient stable during and after injection.  Band aid applied.  VSS.  Patient left in satisfactory condition with no s/s of distress noted.

## 2024-03-23 NOTE — Patient Instructions (Signed)
 [facility] [place] - A DEPT OF [place]  Discharge Instructions: Thank you for choosing [facility] to provide your oncology and hematology care.  If you have a lab appointment with the Cancer Center - please note that after April 8th, 2024, all labs will be drawn in the cancer center.  You do not have to check in or register with the main entrance as you have in the past but will complete your check-in in the cancer center.  Wear comfortable clothing and clothing appropriate for easy access to any Portacath or PICC line.   We strive to give you quality time with your provider. You may need to reschedule your appointment if you arrive late (15 or more minutes).  Arriving late affects you and other patients whose appointments are after yours.  Also, if you miss three or more appointments without notifying the office, you may be dismissed from the clinic at the provider's discretion.      For prescription refill requests, have your pharmacy contact our office and allow 72 hours for refills to be completed.    Today you received the following chemotherapy and/or immunotherapy agents velcade .       To help prevent nausea and vomiting after your treatment, we encourage you to take your nausea medication as directed.  BELOW ARE SYMPTOMS THAT SHOULD BE REPORTED IMMEDIATELY: *FEVER GREATER THAN 100.4 F (38 C) OR HIGHER *CHILLS OR SWEATING *NAUSEA AND VOMITING THAT IS NOT CONTROLLED WITH YOUR NAUSEA MEDICATION *UNUSUAL SHORTNESS OF BREATH *UNUSUAL BRUISING OR BLEEDING *URINARY PROBLEMS (pain or burning when urinating, or frequent urination) *BOWEL PROBLEMS (unusual diarrhea, constipation, pain near the anus) TENDERNESS IN MOUTH AND THROAT WITH OR WITHOUT PRESENCE OF ULCERS (sore throat, sores in mouth, or a toothache) UNUSUAL RASH, SWELLING OR PAIN  UNUSUAL VAGINAL DISCHARGE OR ITCHING   Items with * indicate a potential emergency and should be followed up  as soon as possible or go to the Emergency Department if any problems should occur.  Please show the CHEMOTHERAPY ALERT CARD or IMMUNOTHERAPY ALERT CARD at check-in to the Emergency Department and triage nurse.  Should you have questions after your visit or need to cancel or reschedule your appointment, please contact [facility] [place] - A DEPT OF [name] [phone]  and follow the prompts.  Office hours are 8:00 a.m. to 4:30 p.m. Monday - Friday. Please note that voicemails left after 4:00 p.m. may not be returned until the following business day.  We are closed weekends and major holidays. You have access to a nurse at all times for urgent questions. Please call the main number to the clinic [phone] and follow the prompts.  For any non-urgent questions, you may also contact your provider using MyChart. We now offer e-Visits for anyone 8 and older to request care online for non-urgent symptoms. For details visit [email or web address].   Also download the MyChart app! Go to the app store, search MyChart, open the app, select [place], and log in with your MyChart username and password.

## 2024-03-27 ENCOUNTER — Other Ambulatory Visit: Payer: Self-pay

## 2024-03-30 ENCOUNTER — Inpatient Hospital Stay

## 2024-03-30 VITALS — BP 116/72 | HR 80 | Temp 96.9°F | Resp 20

## 2024-03-30 DIAGNOSIS — Z5112 Encounter for antineoplastic immunotherapy: Secondary | ICD-10-CM | POA: Diagnosis not present

## 2024-03-30 DIAGNOSIS — C9000 Multiple myeloma not having achieved remission: Secondary | ICD-10-CM

## 2024-03-30 LAB — FERRITIN: Ferritin: 995 ng/mL — ABNORMAL HIGH (ref 11–307)

## 2024-03-30 LAB — CBC WITH DIFFERENTIAL/PLATELET
Abs Immature Granulocytes: 0.01 K/uL (ref 0.00–0.07)
Basophils Absolute: 0 K/uL (ref 0.0–0.1)
Basophils Relative: 1 %
Eosinophils Absolute: 0.2 K/uL (ref 0.0–0.5)
Eosinophils Relative: 4 %
HCT: 32 % — ABNORMAL LOW (ref 36.0–46.0)
Hemoglobin: 10.3 g/dL — ABNORMAL LOW (ref 12.0–15.0)
Immature Granulocytes: 0 %
Lymphocytes Relative: 17 %
Lymphs Abs: 0.7 K/uL (ref 0.7–4.0)
MCH: 34.3 pg — ABNORMAL HIGH (ref 26.0–34.0)
MCHC: 32.2 g/dL (ref 30.0–36.0)
MCV: 106.7 fL — ABNORMAL HIGH (ref 80.0–100.0)
Monocytes Absolute: 0.4 K/uL (ref 0.1–1.0)
Monocytes Relative: 11 %
Neutro Abs: 2.7 K/uL (ref 1.7–7.7)
Neutrophils Relative %: 67 %
Platelets: 179 K/uL (ref 150–400)
RBC: 3 MIL/uL — ABNORMAL LOW (ref 3.87–5.11)
RDW: 15.3 % (ref 11.5–15.5)
WBC: 4 K/uL (ref 4.0–10.5)
nRBC: 0 % (ref 0.0–0.2)

## 2024-03-30 LAB — VITAMIN B12: Vitamin B-12: 1821 pg/mL — ABNORMAL HIGH (ref 180–914)

## 2024-03-30 LAB — COMPREHENSIVE METABOLIC PANEL WITH GFR
ALT: 10 U/L (ref 0–44)
AST: 20 U/L (ref 15–41)
Albumin: 4.2 g/dL (ref 3.5–5.0)
Alkaline Phosphatase: 71 U/L (ref 38–126)
Anion gap: 17 — ABNORMAL HIGH (ref 5–15)
BUN: 13 mg/dL (ref 8–23)
CO2: 25 mmol/L (ref 22–32)
Calcium: 9.8 mg/dL (ref 8.9–10.3)
Chloride: 92 mmol/L — ABNORMAL LOW (ref 98–111)
Creatinine, Ser: 4.38 mg/dL — ABNORMAL HIGH (ref 0.44–1.00)
GFR, Estimated: 9 mL/min — ABNORMAL LOW
Glucose, Bld: 140 mg/dL — ABNORMAL HIGH (ref 70–99)
Potassium: 3.8 mmol/L (ref 3.5–5.1)
Sodium: 134 mmol/L — ABNORMAL LOW (ref 135–145)
Total Bilirubin: 0.3 mg/dL (ref 0.0–1.2)
Total Protein: 7.1 g/dL (ref 6.5–8.1)

## 2024-03-30 LAB — IRON AND TIBC
Iron: 44 ug/dL (ref 28–170)
Saturation Ratios: 38 % — ABNORMAL HIGH (ref 10.4–31.8)
TIBC: 114 ug/dL — ABNORMAL LOW (ref 250–450)
UIBC: 70 ug/dL

## 2024-03-30 LAB — MAGNESIUM: Magnesium: 2.4 mg/dL (ref 1.7–2.4)

## 2024-03-30 LAB — FOLATE: Folate: >20 ng/mL

## 2024-03-30 MED ORDER — PROCHLORPERAZINE MALEATE 10 MG PO TABS
10.0000 mg | ORAL_TABLET | Freq: Once | ORAL | Status: AC
  Administered 2024-03-30: 10 mg via ORAL
  Filled 2024-03-30: qty 1

## 2024-03-30 MED ORDER — BORTEZOMIB CHEMO SQ INJECTION 3.5 MG (2.5MG/ML)
1.2000 mg/m2 | Freq: Once | INTRAMUSCULAR | Status: AC
  Administered 2024-03-30: 2 mg via SUBCUTANEOUS
  Filled 2024-03-30: qty 0.8

## 2024-03-30 NOTE — Progress Notes (Signed)
 Patient presents today for Velcade  injection per providers order.  Vital signs and labs within parameters for injection.  Patient has no new complaints at this time. Stable during administration without incident; injection site WNL; see MAR for injection details.  Patient tolerated procedure well and without incident.  No questions or complaints noted at this time.

## 2024-03-30 NOTE — Patient Instructions (Signed)
 [facility] [place] - [facility]  Discharge Instructions: Thank you for choosing [facility] to provide your oncology and hematology care.  If you have a lab appointment with the Cancer Center - please note that after April 8th, 2024, all labs will be drawn in the cancer center.  You do not have to check in or register with the main entrance as you have in the past but will complete your check-in in the cancer center.  Wear comfortable clothing and clothing appropriate for easy access to any Portacath or PICC line.   We strive to give you quality time with your provider. You may need to reschedule your appointment if you arrive late (15 or more minutes).  Arriving late affects you and other patients whose appointments are after yours.  Also, if you miss three or more appointments without notifying the office, you may be dismissed from the clinic at the provider's discretion.      For prescription refill requests, have your pharmacy contact our office and allow 72 hours for refills to be completed.    Today you received the following chemotherapy and/or immunotherapy agents Velcade      To help prevent nausea and vomiting after your treatment, we encourage you to take your nausea medication as directed.  BELOW ARE SYMPTOMS THAT SHOULD BE REPORTED IMMEDIATELY: *FEVER GREATER THAN 100.4 F (38 C) OR HIGHER *CHILLS OR SWEATING *NAUSEA AND VOMITING THAT IS NOT CONTROLLED WITH YOUR NAUSEA MEDICATION *UNUSUAL SHORTNESS OF BREATH *UNUSUAL BRUISING OR BLEEDING *URINARY PROBLEMS (pain or burning when urinating, or frequent urination) *BOWEL PROBLEMS (unusual diarrhea, constipation, pain near the anus) TENDERNESS IN MOUTH AND THROAT WITH OR WITHOUT PRESENCE OF ULCERS (sore throat, sores in mouth, or a toothache) UNUSUAL RASH, SWELLING OR PAIN  UNUSUAL VAGINAL DISCHARGE OR ITCHING   Items with * indicate a potential emergency and should be followed up  as soon as possible or go to the Emergency Department if any problems should occur.  Please show the CHEMOTHERAPY ALERT CARD or IMMUNOTHERAPY ALERT CARD at check-in to the Emergency Department and triage nurse.  Should you have questions after your visit or need to cancel or reschedule your appointment, please contact [facility] [place] - A DEPT OF [facility] [phone]  and follow the prompts.  Office hours are 8:00 a.m. to 4:30 p.m. Monday - Friday. Please note that voicemails left after 4:00 p.m. may not be returned until the following business day.  We are closed weekends and major holidays. You have access to a nurse at all times for urgent questions. Please call the main number to the clinic [phone] and follow the prompts.  For any non-urgent questions, you may also contact your provider using MyChart. We now offer e-Visits for anyone 44 and older to request care online for non-urgent symptoms. For details visit [email or web address].   Also download the MyChart app! Go to the app store, search "MyChart", open the app, select [place], and log in with your MyChart username and password.

## 2024-04-04 ENCOUNTER — Non-Acute Institutional Stay (SKILLED_NURSING_FACILITY): Payer: Self-pay | Admitting: Internal Medicine

## 2024-04-04 ENCOUNTER — Encounter: Payer: Self-pay | Admitting: Internal Medicine

## 2024-04-04 DIAGNOSIS — I5032 Chronic diastolic (congestive) heart failure: Secondary | ICD-10-CM | POA: Diagnosis not present

## 2024-04-04 DIAGNOSIS — F01518 Vascular dementia, unspecified severity, with other behavioral disturbance: Secondary | ICD-10-CM | POA: Diagnosis not present

## 2024-04-04 DIAGNOSIS — N186 End stage renal disease: Secondary | ICD-10-CM

## 2024-04-04 DIAGNOSIS — D649 Anemia, unspecified: Secondary | ICD-10-CM

## 2024-04-04 DIAGNOSIS — Z992 Dependence on renal dialysis: Secondary | ICD-10-CM

## 2024-04-04 DIAGNOSIS — I10 Essential (primary) hypertension: Secondary | ICD-10-CM | POA: Diagnosis not present

## 2024-04-04 DIAGNOSIS — E1122 Type 2 diabetes mellitus with diabetic chronic kidney disease: Secondary | ICD-10-CM

## 2024-04-04 NOTE — Assessment & Plan Note (Signed)
 Staff does not report any recent change in behaviors, continue to monitor.

## 2024-04-04 NOTE — Assessment & Plan Note (Addendum)
 Anemia is stable.Indices are actually macrocytic ; but B12 & folate supranormal. Iron & ferritin also WNL.Monitor for bleeding dyscrasias.

## 2024-04-04 NOTE — Progress Notes (Addendum)
 "   NURSING HOME LOCATION:  [facility] ROOM NUMBER:  [id number]  CODE STATUS:  DNR  PCP: [name], NP   This is a nursing facility follow up visit of chronic medical diagnoses to document compliance with Regulation 483.30 (c) in The Long Term Care Survey Manual Phase 2 which mandates caregiver visit ( visits can alternate among physician, PA or NP as per statutes) within 10 days of 30 days / 60 days/ 90 days post admission to SNF date  .  Interim medical record and care since last SNF visit was updated with review of diagnostic studies and change in clinical status since last visit were documented.  HPI: She is a permanent resident of this facility with end-stage renal disease on hemodialysis; GERD; glaucoma; dyslipidemia; essential hypertension; multiple myeloma; stage I infiltrating ductal cancer of the right breast; and diabetes with neurovascular complications including dementia. Labs are checked 3 times a week at the hemodialysis facility.  The most recent lab results in Epic reveal minimal hyponatremia with a value of 134; creatinine 4.38;eGFR 9; &H/H10.3/32 with MCV of 106.7.  Despite the macrocytosis, folate is normal at greater than 20 and vitamin B12 level supranormal at 1821.  Iron and ferritin levels are also normal.  Serially the anemia is relatively stable. A1c was last checked 12/06/23 with a value of 5.1% , down from 5.5%.  Review of systems: Dementia invalidated responses.  She asked me the date.  When I told her it was Tuesday the 27th she inquired September?  She states that she was all right.  Then she went on to complain about fingers been down.  She is referring to flexion & extension changes of fingers of the left hand.  She went on to ask how is my cancer doing?  She asked me am I going (to the cancer doctor) today?  The remainder of the review of system questions were answered with monosyllabic no.  Constitutional: No fever, significant  weight change, fatigue  Eyes: No redness, discharge, pain, vision change ENT/mouth: No nasal congestion,  purulent discharge, earache, change in hearing, sore throat  Cardiovascular: No chest pain, palpitations, paroxysmal nocturnal dyspnea, edema  Respiratory: No cough, sputum production, hemoptysis, DOE, significant snoring, apnea   Gastrointestinal: No heartburn, dysphagia, abdominal pain, nausea /vomiting, rectal bleeding, melena, change in bowels Genitourinary: No dysuria, hematuria, pyuria, incontinence, nocturia Musculoskeletal: No joint stiffness, joint swelling, weakness, pain Dermatologic: No rash, pruritus, change in appearance of skin Neurologic: No dizziness, headache, syncope, seizures, numbness, tingling Psychiatric: No significant anxiety, depression, insomnia, anorexia Endocrine: No change in hair/skin/nails, excessive thirst, excessive hunger, excessive urination  Hematologic/lymphatic: No significant bruising, lymphadenopathy, abnormal bleeding Allergy/immunology: No itchy/watery eyes, significant sneezing, urticaria, angioedema  Physical exam:  Pertinent or positive findings: Affect is flat and face masklike.  Voice is markedly hoarse.  She has bilateral ptosis.  There is slight crusting of the lower lid on the left without purulence or scleritis. Dressing present @ L thorax.Heart sounds distant. She has diffuse low-grade rhonchi.  Abdomen is protuberant.  Pedal pulses are not palpable.  She has nonpitting edema in the context of compression hose.  There is slight flexion of the third and to lesser extent to the fourth left fingers at the PIP joints with extension at the DIP joint.  General appearance: Adequately nourished; no acute distress, increased work of breathing is present.   Lymphatic: No lymphadenopathy about the head, neck, axilla. Eyes: No conjunctival inflammation or lid edema is  present. There is no scleral icterus. Ears:  External ear exam shows no significant  lesions or deformities.   Nose:  External nasal examination shows no deformity or inflammation. Nasal mucosa are pink and moist without lesions, exudates Oral exam:  Lips and gums are healthy appearing. There is no oropharyngeal erythema or exudate. Neck:  No thyromegaly, masses, tenderness noted.    Heart:  Normal rate and regular rhythm. S1 and S2 normal without gallop, murmur, click, rub .  Lungs: wheezes, rales, rubs. Abdomen: Bowel sounds are normal. Abdomen is soft and nontender with no organomegaly, hernias, masses. GU: Deferred  Extremities:  No cyanosis, clubbing Neurologic exam :Balance, Rhomberg, finger to nose testing could not be completed due to clinical state Skin: Warm & dry w/o tenting. No significant lesions or rash.  See summary under each active problem in the Problem List with associated updated therapeutic plan :  Chronic kidney disease with end stage renal disease on dialysis due to type 2 diabetes mellitus (HCC) A1c 5.1% on 12/06/23; update indicated. Renal function monitored @ HD 3 X weekly.  Normocytic anemia Anemia is stable.Indices are actually macrocytic ; but B12 & folate supranormal. Iron & ferritin also WNL.Monitor for bleeding dyscrasias.  Vascular dementia with behavior disturbance [facility]) Staff does not report any recent change in behaviors, continue to monitor.  Chronic diastolic heart failure (HCC) CHF clinically compensated; no NVD or significant peripheral edema. No change in present cardiac regimen indicated.    Essential hypertension Today's BP is an outlier; BP average will be determined & meds adjusted as clinically appropriate.      "

## 2024-04-04 NOTE — Assessment & Plan Note (Signed)
 A1c 5.1% on 12/06/23; update indicated. Renal function monitored @ HD 3 X weekly.

## 2024-04-04 NOTE — Patient Instructions (Signed)
 See assessment and plan under each diagnosis in the problem list and acutely for this visit

## 2024-04-04 NOTE — Assessment & Plan Note (Signed)
 Today's BP is an outlier; BP average will be determined & meds adjusted as clinically appropriate.

## 2024-04-04 NOTE — Assessment & Plan Note (Signed)
CHF clinically compensated; no NVD or significant peripheral edema. No change in present cardiac regimen indicated.

## 2024-04-06 ENCOUNTER — Inpatient Hospital Stay

## 2024-04-07 ENCOUNTER — Other Ambulatory Visit: Payer: Self-pay

## 2024-04-13 ENCOUNTER — Inpatient Hospital Stay: Attending: Hematology

## 2024-04-13 ENCOUNTER — Other Ambulatory Visit: Payer: Self-pay | Admitting: Oncology

## 2024-04-13 ENCOUNTER — Inpatient Hospital Stay

## 2024-04-13 VITALS — BP 136/72 | HR 82 | Temp 97.5°F | Resp 20

## 2024-04-13 DIAGNOSIS — C9000 Multiple myeloma not having achieved remission: Secondary | ICD-10-CM

## 2024-04-13 LAB — COMPREHENSIVE METABOLIC PANEL WITH GFR
ALT: 9 U/L (ref 0–44)
AST: 17 U/L (ref 15–41)
Albumin: 4.1 g/dL (ref 3.5–5.0)
Alkaline Phosphatase: 67 U/L (ref 38–126)
Anion gap: 18 — ABNORMAL HIGH (ref 5–15)
BUN: 22 mg/dL (ref 8–23)
CO2: 24 mmol/L (ref 22–32)
Calcium: 9.6 mg/dL (ref 8.9–10.3)
Chloride: 92 mmol/L — ABNORMAL LOW (ref 98–111)
Creatinine, Ser: 6.42 mg/dL — ABNORMAL HIGH (ref 0.44–1.00)
GFR, Estimated: 6 mL/min — ABNORMAL LOW
Glucose, Bld: 125 mg/dL — ABNORMAL HIGH (ref 70–99)
Potassium: 4.6 mmol/L (ref 3.5–5.1)
Sodium: 133 mmol/L — ABNORMAL LOW (ref 135–145)
Total Bilirubin: 0.3 mg/dL (ref 0.0–1.2)
Total Protein: 6.9 g/dL (ref 6.5–8.1)

## 2024-04-13 LAB — CBC WITH DIFFERENTIAL/PLATELET
Abs Immature Granulocytes: 0.01 10*3/uL (ref 0.00–0.07)
Basophils Absolute: 0 10*3/uL (ref 0.0–0.1)
Basophils Relative: 1 %
Eosinophils Absolute: 0.2 10*3/uL (ref 0.0–0.5)
Eosinophils Relative: 4 %
HCT: 32.1 % — ABNORMAL LOW (ref 36.0–46.0)
Hemoglobin: 10.3 g/dL — ABNORMAL LOW (ref 12.0–15.0)
Immature Granulocytes: 0 %
Lymphocytes Relative: 13 %
Lymphs Abs: 0.6 10*3/uL — ABNORMAL LOW (ref 0.7–4.0)
MCH: 33.9 pg (ref 26.0–34.0)
MCHC: 32.1 g/dL (ref 30.0–36.0)
MCV: 105.6 fL — ABNORMAL HIGH (ref 80.0–100.0)
Monocytes Absolute: 0.7 10*3/uL (ref 0.1–1.0)
Monocytes Relative: 15 %
Neutro Abs: 2.9 10*3/uL (ref 1.7–7.7)
Neutrophils Relative %: 67 %
Platelets: 180 10*3/uL (ref 150–400)
RBC: 3.04 MIL/uL — ABNORMAL LOW (ref 3.87–5.11)
RDW: 15.5 % (ref 11.5–15.5)
WBC: 4.3 10*3/uL (ref 4.0–10.5)
nRBC: 0 % (ref 0.0–0.2)

## 2024-04-13 LAB — MAGNESIUM: Magnesium: 2.5 mg/dL — ABNORMAL HIGH (ref 1.7–2.4)

## 2024-04-13 MED ORDER — PROCHLORPERAZINE MALEATE 10 MG PO TABS
10.0000 mg | ORAL_TABLET | Freq: Once | ORAL | Status: AC
  Administered 2024-04-13: 10 mg via ORAL
  Filled 2024-04-13: qty 1

## 2024-04-13 MED ORDER — BORTEZOMIB CHEMO SQ INJECTION 3.5 MG (2.5MG/ML)
1.2000 mg/m2 | Freq: Once | INTRAMUSCULAR | Status: AC
  Administered 2024-04-13: 2 mg via SUBCUTANEOUS
  Filled 2024-04-13: qty 0.8

## 2024-04-13 NOTE — Patient Instructions (Signed)
 [facility] - A DEPT OF [facility]  Discharge Instructions: Thank you for choosing [facility] to provide your oncology and hematology care.  If you have a lab appointment with the Cancer Center - please note that after April 8th, 2024, all labs will be drawn in the cancer center.  You do not have to check in or register with the main entrance as you have in the past but will complete your check-in in the cancer center.  Wear comfortable clothing and clothing appropriate for easy access to any Portacath or PICC line.   We strive to give you quality time with your provider. You may need to reschedule your appointment if you arrive late (15 or more minutes).  Arriving late affects you and other patients whose appointments are after yours.  Also, if you miss three or more appointments without notifying the office, you may be dismissed from the clinic at the providers discretion.      For prescription refill requests, have your pharmacy contact our office and allow 72 hours for refills to be completed.    Today you received the following chemotherapy and/or immunotherapy agents Velcade . Bortezomib  Injection What is this medication? BORTEZOMIB  (bor TEZ oh mib) treats lymphoma. It may also be used to treat multiple myeloma, a type of bone marrow cancer. It works by blocking a protein that causes cancer cells to grow and multiply. This helps to slow or stop the spread of cancer cells. This medicine may be used for other purposes; ask your health care provider or pharmacist if you have questions. COMMON BRAND NAME(S): BORUZU , Velcade  What should I tell my care team before I take this medication? They need to know if you have any of these conditions: Dehydration Diabetes Heart disease Liver disease Tingling of the fingers or toes or other nerve disorder An unusual or allergic reaction to bortezomib , other medications, foods, dyes, or preservatives If  you or your partner are pregnant or trying to get pregnant Breastfeeding How should I use this medication? This medication is injected into a vein or under the skin. It is given by your care team in a hospital or clinic setting. Talk to your care team about the use of this medication in children. Special care may be needed. Overdosage: If you think you have taken too much of this medicine contact a poison control center or emergency room at once. NOTE: This medicine is only for you. Do not share this medicine with others. What if I miss a dose? Keep appointments for follow-up doses. It is important not to miss your dose. Call your care team if you are unable to keep an appointment. What may interact with this medication? Ketoconazole Rifampin This list may not describe all possible interactions. Give your health care provider a list of all the medicines, herbs, non-prescription drugs, or dietary supplements you use. Also tell them if you smoke, drink alcohol, or use illegal drugs. Some items may interact with your medicine. What should I watch for while using this medication? Your condition will be monitored carefully while you are receiving this medication. You may need blood work while taking this medication. This medication may affect your coordination, reaction time, or judgment. Do not drive or operate machinery until you know how this medication affects you. Sit up or stand slowly to reduce the risk of dizzy or fainting spells. Drinking alcohol with this medication can increase the risk of these side effects. This  medication may increase your risk of getting an infection. Call your care team for advice if you get a fever, chills, sore throat, or other symptoms of a cold or flu. Do not treat yourself. Try to avoid being around people who are sick. Check with your care team if you have severe diarrhea, nausea, and vomiting, or if you sweat a lot. The loss of too much body fluid may make it  dangerous for you to take this medication. Talk to your care team if you may be pregnant. Serious birth defects can occur if you take this medication during pregnancy and for 7 months after the last dose. You will need a negative pregnancy test before starting this medication. Contraception is recommended while taking this medication and for 7 months after the last dose. Your care team can help you find the option that works for you. If your partner can get pregnant, use a condom during sex while taking this medication and for 4 months after the last dose. Do not breastfeed while taking this medication and for 2 months after the last dose. This medication may cause infertility. Talk to your care team if you are concerned about your fertility. What side effects may I notice from receiving this medication? Side effects that you should report to your care team as soon as possible: Allergic reactions--skin rash, itching, hives, swelling of the face, lips, tongue, or throat Bleeding--bloody or black, tar-like stools, vomiting blood or brown material that looks like coffee grounds, red or dark brown urine, small red or purple spots on skin, unusual bruising or bleeding Bleeding in the brain--severe headache, stiff neck, confusion, dizziness, change in vision, numbness or weakness of the face, arm, or leg, trouble speaking, trouble walking, vomiting Bowel blockage--stomach cramping, unable to have a bowel movement or pass gas, loss of appetite, vomiting Heart failure--shortness of breath, swelling of the ankles, feet, or hands, sudden weight gain, unusual weakness or fatigue Infection--fever, chills, cough, sore throat, wounds that don't heal, pain or trouble when passing urine, general feeling of discomfort or being unwell Liver injury--right upper belly pain, loss of appetite, nausea, light-colored stool, dark yellow or brown urine, yellowing skin or eyes, unusual weakness or fatigue Low blood  pressure--dizziness, feeling faint or lightheaded, blurry vision Lung injury--shortness of breath or trouble breathing, cough, spitting up blood, chest pain, fever Pain, tingling, or numbness in the hands or feet Severe or prolonged diarrhea Stomach pain, bloody diarrhea, pale skin, unusual weakness or fatigue, decrease in the amount of urine, which may be signs of hemolytic uremic syndrome Sudden and severe headache, confusion, change in vision, seizures, which may be signs of posterior reversible encephalopathy syndrome (PRES) TTP--purple spots on the skin or inside the mouth, pale skin, yellowing skin or eyes, unusual weakness or fatigue, fever, fast or irregular heartbeat, confusion, change in vision, trouble speaking, trouble walking Tumor lysis syndrome (TLS)--nausea, vomiting, diarrhea, decrease in the amount of urine, dark urine, unusual weakness or fatigue, confusion, muscle pain or cramps, fast or irregular heartbeat, joint pain Side effects that usually do not require medical attention (report these to your care team if they continue or are bothersome): Constipation Diarrhea Fatigue Loss of appetite Nausea This list may not describe all possible side effects. Call your doctor for medical advice about side effects. You may report side effects to FDA at 1-800-FDA-1088. Where should I keep my medication? This medication is given in a hospital or clinic. It will not be stored at home. NOTE: This sheet  is a summary. It may not cover all possible information. If you have questions about this medicine, talk to your doctor, pharmacist, or health care provider.  2025 Elsevier/Gold Standard (2023-11-18 00:00:00)      To help prevent nausea and vomiting after your treatment, we encourage you to take your nausea medication as directed.  BELOW ARE SYMPTOMS THAT SHOULD BE REPORTED IMMEDIATELY: *FEVER GREATER THAN 100.4 F (38 C) OR HIGHER *CHILLS OR SWEATING *NAUSEA AND VOMITING THAT IS NOT  CONTROLLED WITH YOUR NAUSEA MEDICATION *UNUSUAL SHORTNESS OF BREATH *UNUSUAL BRUISING OR BLEEDING *URINARY PROBLEMS (pain or burning when urinating, or frequent urination) *BOWEL PROBLEMS (unusual diarrhea, constipation, pain near the anus) TENDERNESS IN MOUTH AND THROAT WITH OR WITHOUT PRESENCE OF ULCERS (sore throat, sores in mouth, or a toothache) UNUSUAL RASH, SWELLING OR PAIN  UNUSUAL VAGINAL DISCHARGE OR ITCHING   Items with * indicate a potential emergency and should be followed up as soon as possible or go to the Emergency Department if any problems should occur.  Please show the CHEMOTHERAPY ALERT CARD or IMMUNOTHERAPY ALERT CARD at check-in to the Emergency Department and triage nurse.  Should you have questions after your visit or need to cancel or reschedule your appointment, please contact [facility] [place] - A DEPT OF [name] [phone]  and follow the prompts.  Office hours are 8:00 a.m. to 4:30 p.m. Monday - Friday. Please note that voicemails left after 4:00 p.m. may not be returned until the following business day.  We are closed weekends and major holidays. You have access to a nurse at all times for urgent questions. Please call the main number to the clinic [phone] and follow the prompts.  For any non-urgent questions, you may also contact your provider using MyChart. We now offer e-Visits for anyone 67 and older to request care online for non-urgent symptoms. For details visit [email or web address].   Also download the MyChart app! Go to the app store, search MyChart, open the app, select [place], and log in with your MyChart username and password.

## 2024-04-13 NOTE — Progress Notes (Signed)
 Patient presents today for Velcade  injection. Vital signs and lab work are within parameters for treatment. Disregard creatinine for treatment . Dialysis patient.   Velcade  given today per MD orders. Tolerated without adverse affects. Vital signs stable. No complaints at this time. Discharged from clinic by wheel chair in stable condition. Alert and oriented x 3. F/U with [facility] as scheduled.

## 2024-04-20 ENCOUNTER — Inpatient Hospital Stay

## 2024-04-20 DIAGNOSIS — C9000 Multiple myeloma not having achieved remission: Secondary | ICD-10-CM

## 2024-04-27 ENCOUNTER — Inpatient Hospital Stay

## 2024-05-04 ENCOUNTER — Inpatient Hospital Stay

## 2024-05-11 ENCOUNTER — Inpatient Hospital Stay: Attending: Hematology

## 2024-05-11 ENCOUNTER — Inpatient Hospital Stay

## 2024-05-18 ENCOUNTER — Inpatient Hospital Stay

## 2024-05-25 ENCOUNTER — Inpatient Hospital Stay

## 2024-06-01 ENCOUNTER — Inpatient Hospital Stay

## 2024-06-08 ENCOUNTER — Inpatient Hospital Stay

## 2024-06-08 ENCOUNTER — Inpatient Hospital Stay: Attending: Hematology

## 2024-06-15 ENCOUNTER — Inpatient Hospital Stay

## 2024-06-15 ENCOUNTER — Inpatient Hospital Stay: Admitting: Oncology
